# Patient Record
Sex: Male | Born: 1935
Health system: Southern US, Community
[De-identification: ages and names within clinical notes are randomized; demographics above are authoritative.]

## PROBLEM LIST (undated history)

## (undated) DIAGNOSIS — Z8546 Personal history of malignant neoplasm of prostate: Secondary | ICD-10-CM

## (undated) DIAGNOSIS — G510 Bell's palsy: Secondary | ICD-10-CM

## (undated) DIAGNOSIS — I447 Left bundle-branch block, unspecified: Secondary | ICD-10-CM

## (undated) DIAGNOSIS — E119 Type 2 diabetes mellitus without complications: Secondary | ICD-10-CM

## (undated) DIAGNOSIS — K219 Gastro-esophageal reflux disease without esophagitis: Secondary | ICD-10-CM

## (undated) DIAGNOSIS — C3490 Malignant neoplasm of unspecified part of unspecified bronchus or lung: Secondary | ICD-10-CM

## (undated) DIAGNOSIS — C801 Malignant (primary) neoplasm, unspecified: Secondary | ICD-10-CM

## (undated) DIAGNOSIS — F329 Major depressive disorder, single episode, unspecified: Secondary | ICD-10-CM

## (undated) DIAGNOSIS — Z950 Presence of cardiac pacemaker: Secondary | ICD-10-CM

## (undated) DIAGNOSIS — I10 Essential (primary) hypertension: Secondary | ICD-10-CM

## (undated) DIAGNOSIS — R2689 Other abnormalities of gait and mobility: Secondary | ICD-10-CM

## (undated) DIAGNOSIS — I428 Other cardiomyopathies: Secondary | ICD-10-CM

## (undated) DIAGNOSIS — W19XXXA Unspecified fall, initial encounter: Secondary | ICD-10-CM

## (undated) DIAGNOSIS — G4730 Sleep apnea, unspecified: Secondary | ICD-10-CM

## (undated) DIAGNOSIS — I2510 Atherosclerotic heart disease of native coronary artery without angina pectoris: Secondary | ICD-10-CM

## (undated) DIAGNOSIS — R42 Dizziness and giddiness: Secondary | ICD-10-CM

## (undated) DIAGNOSIS — E785 Hyperlipidemia, unspecified: Secondary | ICD-10-CM

## (undated) DIAGNOSIS — I456 Pre-excitation syndrome: Secondary | ICD-10-CM

## (undated) DIAGNOSIS — I442 Atrioventricular block, complete: Secondary | ICD-10-CM

## (undated) DIAGNOSIS — F32A Depression, unspecified: Secondary | ICD-10-CM

## (undated) DIAGNOSIS — I5042 Chronic combined systolic (congestive) and diastolic (congestive) heart failure: Secondary | ICD-10-CM

## (undated) DIAGNOSIS — M1990 Unspecified osteoarthritis, unspecified site: Secondary | ICD-10-CM

## (undated) HISTORY — DX: Chronic combined systolic (congestive) and diastolic (congestive) heart failure: I50.42

## (undated) HISTORY — DX: Malignant neoplasm of unspecified part of unspecified bronchus or lung: C34.90

## (undated) HISTORY — PX: INSERT / REPLACE / REMOVE PACEMAKER: SUR710

## (undated) HISTORY — PX: BACK SURGERY: SHX140

## (undated) HISTORY — PX: HAND SURGERY: SHX662

## (undated) HISTORY — DX: Bell's palsy: G51.0

## (undated) HISTORY — PX: REPLACEMENT TOTAL KNEE: SUR1224

## (undated) HISTORY — DX: Other cardiomyopathies: I42.8

## (undated) HISTORY — PX: OTHER SURGICAL HISTORY: SHX169

## (undated) HISTORY — PX: MOHS SURGERY: SUR867

## (undated) HISTORY — DX: Other abnormalities of gait and mobility: R26.89

## (undated) HISTORY — PX: CATARACT EXTRACTION: SUR2

## (undated) HISTORY — DX: Left bundle-branch block, unspecified: I44.7

## (undated) HISTORY — PX: PROSTATE SURGERY: SHX751

## (undated) HISTORY — DX: Atrioventricular block, complete: I44.2

## (undated) HISTORY — DX: Hyperlipidemia, unspecified: E78.5

## (undated) HISTORY — PX: PACEMAKER INSERTION: SHX728

## (undated) HISTORY — DX: Dizziness and giddiness: R42

## (undated) HISTORY — DX: Essential (primary) hypertension: I10

## (undated) HISTORY — DX: Unspecified fall, initial encounter: W19.XXXA

## (undated) HISTORY — PX: KNEE SURGERY: SHX244

## (undated) HISTORY — DX: Personal history of malignant neoplasm of prostate: Z85.46

## (undated) HISTORY — DX: Pre-excitation syndrome: I45.6

## (undated) HISTORY — DX: Atherosclerotic heart disease of native coronary artery without angina pectoris: I25.10

---
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
## 1989-12-23 HISTORY — PX: OTHER SURGICAL HISTORY: SHX169

## 2002-12-23 HISTORY — PX: JOINT REPLACEMENT: SHX530

## 2004-10-30 ENCOUNTER — Ambulatory Visit: Payer: Self-pay | Admitting: Gastroenterology

## 2004-11-24 ENCOUNTER — Ambulatory Visit: Payer: Self-pay | Admitting: Orthopedic Surgery

## 2004-11-24 ENCOUNTER — Other Ambulatory Visit: Payer: Self-pay

## 2006-10-28 ENCOUNTER — Ambulatory Visit: Payer: Self-pay | Admitting: Internal Medicine

## 2007-11-30 ENCOUNTER — Ambulatory Visit: Payer: Self-pay | Admitting: Gastroenterology

## 2009-08-01 ENCOUNTER — Ambulatory Visit: Payer: Self-pay | Admitting: Internal Medicine

## 2010-05-31 ENCOUNTER — Ambulatory Visit: Payer: Self-pay | Admitting: Internal Medicine

## 2010-06-19 ENCOUNTER — Inpatient Hospital Stay (HOSPITAL_COMMUNITY): Admission: RE | Admit: 2010-06-19 | Discharge: 2010-06-20 | Payer: Self-pay | Admitting: Neurosurgery

## 2010-09-13 ENCOUNTER — Encounter: Admission: RE | Admit: 2010-09-13 | Discharge: 2010-09-13 | Payer: Self-pay | Admitting: Neurosurgery

## 2010-11-29 ENCOUNTER — Encounter: Payer: Self-pay | Admitting: Cardiovascular Disease

## 2010-11-29 ENCOUNTER — Encounter: Payer: Self-pay | Admitting: Internal Medicine

## 2010-11-29 ENCOUNTER — Ambulatory Visit: Payer: Self-pay | Admitting: Cardiovascular Disease

## 2010-11-29 ENCOUNTER — Inpatient Hospital Stay (HOSPITAL_COMMUNITY)
Admission: EM | Admit: 2010-11-29 | Discharge: 2010-11-30 | Payer: Self-pay | Source: Home / Self Care | Attending: Internal Medicine | Admitting: Internal Medicine

## 2010-12-12 ENCOUNTER — Ambulatory Visit: Payer: Self-pay

## 2010-12-12 ENCOUNTER — Encounter: Payer: Self-pay | Admitting: Internal Medicine

## 2011-01-02 ENCOUNTER — Encounter: Payer: Self-pay | Admitting: Internal Medicine

## 2011-01-22 NOTE — Assessment & Plan Note (Signed)
Summary: EKG/AMD  Nurse Visit   Vital Signs:  Patient profile:   77 year old male Height:      69 inches Weight:      191 pounds BMI:     28.31 Pulse rate:   30 / minute BP sitting:   158 / 68  (left arm) Cuff size:   regular  Vitals Entered By: Lysbeth Galas CMA (November 29, 2010 11:44 AM) CC: c/o shortness of breath.  He went for a pre op for cataract surgery and was told had decrease heart rate. Comments Pt is alert and oriented x 3, only c/o slight incr in SOB.  A little more unsteady on his feet, but this is not new per pt report.  EKG done pt's HR is 30, and rhythm assessed by Dr Mariah Milling and pt is in complete heart block.  Non emergent EMS transport called, pt transported by EMS o [facility].  Called ED triage RN made aware pt coming, also called Trish to make aware.    Current Medications (verified): 1)  Hydrochlorothiazide 50 Mg Tabs (Hydrochlorothiazide) .Marland Kitchen.. 1 Tablet Once Daily 2)  Bupropion Hcl 150 Mg Xr24h-Tab (Bupropion Hcl) .... 3 Tablets in The Morning 3)  Simvastatin 20 Mg Tabs (Simvastatin) .Marland Kitchen.. 1 Tablet At Bedtime 4)  Nexium 40 Mg Cpdr (Esomeprazole Magnesium) .Marland Kitchen.. 1 Tablet Once Daily 5)  Vitamin D3 1000 Unit Tabs (Cholecalciferol) .Marland Kitchen.. 1 Tablet Once Daily 6)  Mens Multivitamin Plus  Tabs (Multiple Vitamins-Minerals) .Marland Kitchen.. 1 Tablet Daily 7)  Aleve 220 Mg Tabs (Naproxen Sodium) .Marland Kitchen.. 1 Tablet Two Times A Day 8)  Acyclovir 400 Mg Tabs (Acyclovir) .Marland Kitchen.. 1 Tablet Two Times A Day  Allergies (verified): 1)  ! Sulfa  Past History:  Past Medical History: Hyperlipidemia Hypertension  Past Surgical History: Ruptered [id number] and 1998 cervical fusion-1984 left knee 1991 and 1992 right knee-1995 catheter ablation-1991 right hand- 1993 prostate cancer-1998 knee replacement-2004 left hand- 2005 back surgery-2011   Visit Type:  Nurse Visit  CC:  c/o shortness of breath.  He went for a pre op for cataract surgery and was told had decrease heart rate.Marland Kitchen

## 2011-01-24 NOTE — Letter (Signed)
Summary: Dr. Rennie Plowman Office  Dr. Rennie Plowman Office   Imported By: Marylou Mccoy 01/17/2011 14:23:55  _____________________________________________________________________  External Attachment:    Type:   Image     Comment:   External Document

## 2011-01-24 NOTE — Cardiovascular Report (Signed)
Summary: Office Visit   Office Visit   Imported By: Roderic Ovens 01/01/2011 14:00:18  _____________________________________________________________________  External Attachment:    Type:   Image     Comment:   External Document

## 2011-01-24 NOTE — Procedures (Signed)
Summary: wound [facility]   Current Medications (verified): 1)  Hydrochlorothiazide 50 Mg Tabs (Hydrochlorothiazide) .Marland Kitchen.. 1 Tablet Once Daily 2)  Bupropion Hcl 150 Mg Xr24h-Tab (Bupropion Hcl) .... 3 Tablets in The Morning 3)  Simvastatin 20 Mg Tabs (Simvastatin) .Marland Kitchen.. 1 Tablet At Bedtime 4)  Nexium 40 Mg Cpdr (Esomeprazole Magnesium) .Marland Kitchen.. 1 Tablet Once Daily 5)  Vitamin D3 1000 Unit Tabs (Cholecalciferol) .Marland Kitchen.. 1 Tablet Once Daily 6)  Mens Multivitamin Plus  Tabs (Multiple Vitamins-Minerals) .Marland Kitchen.. 1 Tablet Daily 7)  Aleve 220 Mg Tabs (Naproxen Sodium) .... As Needed 8)  Amlodipine Besylate 5 Mg Tabs (Amlodipine Besylate) .... One By Mouth Daily  Allergies (verified): 1)  ! Sulfa  PPM Specifications Following MD:  Hillis Range, MD     PPM Vendor:  [employer] Jude     PPM Model Number:  [id number]     PPM Serial Number:  [id number] PPM DOI:  11/29/2010     PPM Implanting MD:  Hillis Range, MD  Lead 1    Location: RA     DOI: 11/29/2010     Model #: [id number]     Serial #: [id number]     Status: active Lead 2    Location: RV     DOI: 11/29/2010     Model #: [id number]     Serial #: [id number]     Status: active  Magnet Response Rate:  BOL 100 ERI 85  Indications:  CHB   PPM Follow Up Remote Check?  No Battery Voltage:  3.20 V     Battery Est. Longevity:  8.2 years     Pacer Dependent:  Yes       PPM Device Measurements Atrium  Amplitude: 4.2 mV, Impedance: 460 ohms, Threshold: 0.75 V at 0.4 msec Right Ventricle  Amplitude: 12 mV, Impedance: 700 ohms, Threshold: 0.5 V at 0.4 msec  Episodes MS Episodes:  0     Percent Mode Switch:  0     Coumadin:  No Atrial Pacing:  <1%     Ventricular Pacing:  100%  Parameters Mode:  DDD     Lower Rate Limit:  60     Upper Rate Limit:  130 Paced AV Delay:  200     Sensed AV Delay:  180 Tech Comments:  Steri strips removed by the patient, no redness or edema noted.  No parameter changes.  Device function normal.  ROV 03/21/11 with Dr. Johney Frame. Altha Harm, LPN   December 12, 2010 12:09 PM

## 2011-02-12 ENCOUNTER — Telehealth (INDEPENDENT_AMBULATORY_CARE_PROVIDER_SITE_OTHER): Payer: Self-pay | Admitting: *Deleted

## 2011-02-19 NOTE — Progress Notes (Signed)
Summary: question re deivce  Phone Note Call from Patient Call back at Home Phone [phone]   Caller: Spouse/ann Summary of Call: pt wife has question re pt pace maker.  Initial call taken by: Roe Coombs,  February 12, 2011 11:04 AM  Follow-up for Phone Call        Spoke with patient's wife explained it was ok to go away overnight and she did not have to take the transmitter. Follow-up by: Altha Harm, LPN,  February 13, 2011 2:32 PM

## 2011-02-27 ENCOUNTER — Encounter (INDEPENDENT_AMBULATORY_CARE_PROVIDER_SITE_OTHER): Payer: Self-pay | Admitting: *Deleted

## 2011-03-05 LAB — BASIC METABOLIC PANEL
CO2: 25 mEq/L (ref 19–32)
Chloride: 107 mEq/L (ref 96–112)
GFR calc Af Amer: >60 mL/min (ref >60)
Glucose, Bld: 92 mg/dL (ref 70–99)
Sodium: 138 mEq/L (ref 135–145)

## 2011-03-05 LAB — CBC
HCT: 43 % (ref 39.0–52.0)
Hemoglobin: 14.6 g/dL (ref 13.0–17.0)
MCH: 27 pg (ref 26.0–34.0)
MCHC: 34 g/dL (ref 30.0–36.0)
MCV: 79.5 fL (ref 78.0–100.0)
RBC: 5.41 MIL/uL (ref 4.22–5.81)

## 2011-03-05 LAB — DIFFERENTIAL
Basophils Relative: 0 % (ref 0–1)
Eosinophils Absolute: 0.3 10*3/uL (ref 0.0–0.7)
Eosinophils Relative: 4 % (ref 0–5)
Lymphs Abs: 2.1 10*3/uL (ref 0.7–4.0)
Monocytes Absolute: 0.7 10*3/uL (ref 0.1–1.0)
Monocytes Relative: 9 % (ref 3–12)

## 2011-03-05 LAB — COMPREHENSIVE METABOLIC PANEL
ALT: 23 U/L (ref 0–53)
Albumin: 3.8 g/dL (ref 3.5–5.2)
Calcium: 9.5 mg/dL (ref 8.4–10.5)
GFR calc Af Amer: >60 mL/min (ref >60)
Glucose, Bld: 90 mg/dL (ref 70–99)
Sodium: 137 mEq/L (ref 135–145)
Total Protein: 6.7 g/dL (ref 6.0–8.3)

## 2011-03-05 LAB — LIPID PANEL
Cholesterol: 145 mg/dL (ref 0–200)
LDL Cholesterol: 83 mg/dL (ref 0–99)
Total CHOL/HDL Ratio: 3.7 RATIO
Triglycerides: 116 mg/dL (ref <150)

## 2011-03-05 LAB — POCT CARDIAC MARKERS
CKMB, poc: 3 ng/mL (ref 1.0–8.0)
Myoglobin, poc: 124 ng/mL (ref 12–200)
Troponin i, poc: <0.05 ng/mL (ref 0.00–0.09)

## 2011-03-05 LAB — TSH: TSH: 1.905 u[IU]/mL (ref 0.350–4.500)

## 2011-03-05 LAB — PROTIME-INR: INR: 1.06 (ref 0.00–1.49)

## 2011-03-05 NOTE — Letter (Signed)
Summary: Appointment - Reschedule  [facility], Main Office  [address]   [place], [place] [address]   Phone: [phone]  Fax: [phone]     February 27, 2011 MRN: [id number]   Howard [name] [address] [place], [place]  [address]   Dear [name],   Due to a change in our office schedule, your appointment on 03-21-11  at  11:30a             must be changed.  It is very important that we reach you to reschedule this appointment. We look forward to participating in your health care needs. Please contact us at the number listed above at your earliest convenience to reschedule this appointment.     Sincerely,  Glass blower/designer

## 2011-03-08 ENCOUNTER — Encounter: Payer: Self-pay | Admitting: Internal Medicine

## 2011-03-10 LAB — PROTIME-INR
INR: 0.99 (ref 0.00–1.49)
Prothrombin Time: 13 seconds (ref 11.6–15.2)

## 2011-03-10 LAB — CBC
HCT: 48.9 % (ref 39.0–52.0)
Hemoglobin: 16.3 g/dL (ref 13.0–17.0)
MCH: 27.7 pg (ref 26.0–34.0)
MCHC: 33.3 g/dL (ref 30.0–36.0)

## 2011-03-10 LAB — DIFFERENTIAL
Lymphocytes Relative: 29 % (ref 12–46)
Lymphs Abs: 1.9 10*3/uL (ref 0.7–4.0)
Neutro Abs: 3.6 10*3/uL (ref 1.7–7.7)
Neutrophils Relative %: 54 % (ref 43–77)

## 2011-03-10 LAB — URINALYSIS, ROUTINE W REFLEX MICROSCOPIC
Bilirubin Urine: NEGATIVE
Glucose, UA: NEGATIVE mg/dL
Hgb urine dipstick: NEGATIVE
Ketones, ur: NEGATIVE mg/dL
Protein, ur: NEGATIVE mg/dL

## 2011-03-10 LAB — COMPREHENSIVE METABOLIC PANEL
CO2: 29 mEq/L (ref 19–32)
Calcium: 10.1 mg/dL (ref 8.4–10.5)
Creatinine, Ser: 0.84 mg/dL (ref 0.4–1.5)
GFR calc non Af Amer: >60 mL/min (ref >60)
Glucose, Bld: 131 mg/dL — ABNORMAL HIGH (ref 70–99)

## 2011-03-10 LAB — APTT: aPTT: 39 seconds — ABNORMAL HIGH (ref 24–37)

## 2011-03-21 ENCOUNTER — Other Ambulatory Visit: Payer: Self-pay | Admitting: Internal Medicine

## 2011-03-21 ENCOUNTER — Encounter: Payer: Self-pay | Admitting: Internal Medicine

## 2011-03-21 ENCOUNTER — Ambulatory Visit (INDEPENDENT_AMBULATORY_CARE_PROVIDER_SITE_OTHER): Payer: Medicare Other | Admitting: Internal Medicine

## 2011-03-21 ENCOUNTER — Other Ambulatory Visit: Payer: Self-pay | Admitting: *Deleted

## 2011-03-21 VITALS — BP 130/80 | HR 72

## 2011-03-21 DIAGNOSIS — I442 Atrioventricular block, complete: Secondary | ICD-10-CM

## 2011-03-21 DIAGNOSIS — I10 Essential (primary) hypertension: Secondary | ICD-10-CM

## 2011-03-21 MED ORDER — HYDROCHLOROTHIAZIDE 50 MG PO TABS: 25.0000 mg | ORAL_TABLET | Freq: Every day | ORAL | Status: DC

## 2011-03-21 MED ORDER — AMLODIPINE BESYLATE 5 MG PO TABS: 5.0000 mg | ORAL_TABLET | Freq: Every day | ORAL | Status: DC

## 2011-03-21 MED ORDER — HYDROCHLOROTHIAZIDE 25 MG PO TABS: 25.0000 mg | ORAL_TABLET | Freq: Every day | ORAL | Status: DC

## 2011-03-21 NOTE — Patient Instructions (Addendum)
Your physician recommends that you schedule a follow-up appointment in: Dec 2012 with Dr Johney Frame  Your physician has recommended you make the following change in your medication: decrease HCTZ to 25mg  daily

## 2011-03-22 NOTE — Telephone Encounter (Signed)
Refill manually faxed

## 2011-03-22 NOTE — Telephone Encounter (Signed)
Refill was manually faxed Howard [name]

## 2011-03-22 NOTE — Telephone Encounter (Signed)
Error not sure why this is open

## 2011-03-23 ENCOUNTER — Encounter: Payer: Self-pay | Admitting: Internal Medicine

## 2011-03-23 DIAGNOSIS — I442 Atrioventricular block, complete: Secondary | ICD-10-CM | POA: Insufficient documentation

## 2011-03-23 DIAGNOSIS — I10 Essential (primary) hypertension: Secondary | ICD-10-CM | POA: Insufficient documentation

## 2011-03-23 NOTE — Assessment & Plan Note (Signed)
Normal pacemaker function See Pace Art report No changes today  

## 2011-03-23 NOTE — Progress Notes (Signed)
The patient presents today for routine electrophysiology followup.  He presented urgently with complete heart block and underwent PPM by me 12/11.  He reports doing very well since that time.  His exercise tolerance has improved and fatigue has resolved.  Today, he denies symptoms of palpitations, chest pain, shortness of breath, orthopnea, PND, lower extremity edema, dizziness, presyncope, syncope, or neurologic sequela.  The patient feels that he is tolerating medications without difficulties and is otherwise without complaint today.   Past Medical History  Diagnosis Date  . Hyperlipidemia   . Hypertension   . Pacemaker 11/30/10    PPM- [facility] Jude implanted by [facility]   Past Surgical History  Procedure Date  . Ruptured disc     1962 and 1998  . Cervical fusion   . Knee surgery     left knee 1991 and 1992; right knee 1995  . Prostate surgery     cancer--1998  . Replacement total knee     2004  . Hand surgery     right 1993; left 2005  . Back surgery     2011  . Pacemaker insertion     PPM-- [employer] Jude 11/30/10 by Fawn Kirk  . Catheter ablation 1991    for WPW    Current outpatient prescriptions:buPROPion (ZYBAN) 150 MG 12 hr tablet, 150 mg. 3 po qam , Disp: , Rfl: ;  Cholecalciferol (VITAMIN D3) 1000 UNITS CAPS, Take by mouth. 1 po daily , Disp: , Rfl: ;  esomeprazole (NEXIUM) 40 MG capsule, Take 40 mg by mouth daily before breakfast.  , Disp: , Rfl: ;  Multiple Vitamin (MULTIVITAMIN) capsule, Take 1 capsule by mouth daily.  , Disp: , Rfl:  naproxen sodium (ANAPROX) 220 MG tablet, Take 220 mg by mouth as needed.  , Disp: , Rfl: ;  simvastatin (ZOCOR) 20 MG tablet, Take 20 mg by mouth at bedtime.  , Disp: , Rfl: ;  amLODipine (NORVASC) 5 MG tablet, Take 1 tablet (5 mg total) by mouth daily., Disp: 90 tablet, Rfl: 3;  hydrochlorothiazide 25 MG tablet, Take 1 tablet (25 mg total) by mouth daily., Disp: 90 tablet, Rfl: 3  Allergies  Allergen Reactions  . Sulfonamide Derivatives     History   Social  History  . Marital Status: Married    Spouse Name: N/A    Number of Children: N/A  . Years of Education: N/A   Occupational History  . Not on file.   Social History Main Topics  . Smoking status: Former Games developer  . Smokeless tobacco: Not on file  . Alcohol Use: No  . Drug Use: No  . Sexually Active: Not on file   Other Topics Concern  . Not on file   Social History Narrative  . No narrative on file

## 2011-03-23 NOTE — Assessment & Plan Note (Signed)
Stable No changes 

## 2011-04-29 ENCOUNTER — Ambulatory Visit: Payer: Self-pay | Admitting: Family Medicine

## 2011-05-21 ENCOUNTER — Telehealth: Payer: Self-pay | Admitting: Internal Medicine

## 2011-05-21 NOTE — Telephone Encounter (Signed)
Spoke with Dr Johney Frame  A magnet does need to be used  Will need to have this procedure rescheduled  Cataracts on both eyes This was scheduled for 6/21

## 2011-05-21 NOTE — Telephone Encounter (Signed)
Wife called and stated that husband is scheduled for eye surgery with Dr. Jettie Pagan.  Dr. Johney Frame gave note that it is ok to place a magnet over device during procedure.   Dr. Jettie Pagan said if this was necessary it would have to be done in hospital setting.  [facility] needs to know if it can be done without magnet.  Wife needs to know as well.  If magnet has to be used they will need to reschedule the procedures.

## 2011-05-24 ENCOUNTER — Ambulatory Visit: Payer: Self-pay | Admitting: Family Medicine

## 2011-06-23 ENCOUNTER — Ambulatory Visit: Payer: Self-pay | Admitting: Family Medicine

## 2011-07-15 ENCOUNTER — Telehealth: Payer: Self-pay | Admitting: Internal Medicine

## 2011-07-15 NOTE — Telephone Encounter (Signed)
Pt's wife called states Dr. Harlon Flor faxed a form for Dr. Johney Frame to sign for clearance for eye surgery. Wife states she called last week and some one tolled  Her, that form was in the  MD's desk for him to sign. Pt. Is aware that Dr. Johney Frame and his nurse are not in the office now . Patient is to start a medication tomorrow to prepared him for eye surgery. We will find out and call her tomorrow. Wife verbalized understanding.

## 2011-07-15 NOTE — Telephone Encounter (Signed)
Pt wife states pt needs sur clearance for eye surgery. Pt wife wants to know if his sur clearance form has been sent.

## 2011-07-16 NOTE — Telephone Encounter (Signed)
Wife aware form faxed for clearance

## 2011-07-24 ENCOUNTER — Encounter (HOSPITAL_COMMUNITY)
Admission: RE | Admit: 2011-07-24 | Discharge: 2011-07-24 | Disposition: A | Discharge status: Home / Self Care | Payer: Medicare Other | Source: Ambulatory Visit | Attending: Ophthalmology | Admitting: Ophthalmology

## 2011-07-24 LAB — CBC
HCT: 43.6 % (ref 39.0–52.0)
Hemoglobin: 14.9 g/dL (ref 13.0–17.0)
MCH: 27 pg (ref 26.0–34.0)
MCV: 79.1 fL (ref 78.0–100.0)
RBC: 5.51 MIL/uL (ref 4.22–5.81)

## 2011-07-24 LAB — BASIC METABOLIC PANEL
CO2: 30 mEq/L (ref 19–32)
Calcium: 10 mg/dL (ref 8.4–10.5)
Chloride: 103 mEq/L (ref 96–112)
Creatinine, Ser: 0.83 mg/dL (ref 0.50–1.35)
Glucose, Bld: 126 mg/dL — ABNORMAL HIGH (ref 70–99)

## 2011-07-29 ENCOUNTER — Encounter: Payer: Self-pay | Admitting: Orthopaedic Surgery

## 2011-07-31 ENCOUNTER — Ambulatory Visit (HOSPITAL_COMMUNITY)
Admission: RE | Admit: 2011-07-31 | Discharge: 2011-07-31 | Disposition: A | Discharge status: Home / Self Care | Payer: Medicare Other | Source: Ambulatory Visit | Attending: Ophthalmology | Admitting: Ophthalmology

## 2011-07-31 DIAGNOSIS — Z01812 Encounter for preprocedural laboratory examination: Secondary | ICD-10-CM | POA: Insufficient documentation

## 2011-07-31 DIAGNOSIS — E119 Type 2 diabetes mellitus without complications: Secondary | ICD-10-CM | POA: Insufficient documentation

## 2011-07-31 DIAGNOSIS — K219 Gastro-esophageal reflux disease without esophagitis: Secondary | ICD-10-CM | POA: Insufficient documentation

## 2011-07-31 DIAGNOSIS — H269 Unspecified cataract: Secondary | ICD-10-CM | POA: Insufficient documentation

## 2011-07-31 DIAGNOSIS — I10 Essential (primary) hypertension: Secondary | ICD-10-CM | POA: Insufficient documentation

## 2011-08-24 ENCOUNTER — Encounter: Payer: Self-pay | Admitting: Orthopaedic Surgery

## 2011-09-12 ENCOUNTER — Encounter (HOSPITAL_COMMUNITY)
Admission: RE | Admit: 2011-09-12 | Discharge: 2011-09-12 | Disposition: A | Discharge status: Home / Self Care | Payer: Medicare Other | Source: Ambulatory Visit | Attending: Ophthalmology | Admitting: Ophthalmology

## 2011-09-12 LAB — BASIC METABOLIC PANEL
BUN: 16 mg/dL (ref 6–23)
CO2: 28 mEq/L (ref 19–32)
GFR calc non Af Amer: >60 mL/min (ref >60)
Glucose, Bld: 169 mg/dL — ABNORMAL HIGH (ref 70–99)
Potassium: 4.7 mEq/L (ref 3.5–5.1)

## 2011-09-12 LAB — CBC
HCT: 43.1 % (ref 39.0–52.0)
Hemoglobin: 14.9 g/dL (ref 13.0–17.0)
MCH: 28 pg (ref 26.0–34.0)
MCHC: 34.6 g/dL (ref 30.0–36.0)
RBC: 5.33 MIL/uL (ref 4.22–5.81)

## 2011-09-18 ENCOUNTER — Ambulatory Visit (HOSPITAL_COMMUNITY)
Admission: RE | Admit: 2011-09-18 | Discharge: 2011-09-18 | Disposition: A | Discharge status: Home / Self Care | Payer: Medicare Other | Source: Ambulatory Visit | Attending: Ophthalmology | Admitting: Ophthalmology

## 2011-09-18 DIAGNOSIS — Z950 Presence of cardiac pacemaker: Secondary | ICD-10-CM | POA: Insufficient documentation

## 2011-09-18 DIAGNOSIS — Z01812 Encounter for preprocedural laboratory examination: Secondary | ICD-10-CM | POA: Insufficient documentation

## 2011-09-18 DIAGNOSIS — E119 Type 2 diabetes mellitus without complications: Secondary | ICD-10-CM | POA: Insufficient documentation

## 2011-09-18 DIAGNOSIS — H268 Other specified cataract: Secondary | ICD-10-CM | POA: Insufficient documentation

## 2011-09-18 DIAGNOSIS — I2510 Atherosclerotic heart disease of native coronary artery without angina pectoris: Secondary | ICD-10-CM | POA: Insufficient documentation

## 2011-09-18 DIAGNOSIS — I10 Essential (primary) hypertension: Secondary | ICD-10-CM | POA: Insufficient documentation

## 2011-09-18 DIAGNOSIS — G4733 Obstructive sleep apnea (adult) (pediatric): Secondary | ICD-10-CM | POA: Insufficient documentation

## 2011-10-10 NOTE — Op Note (Signed)
[name], [name] NO.:  [id number]  MEDICAL RECORD NO.:  [id number]  LOCATION:  [facility]                         FACILITY:  [facility]  PHYSICIAN:  Chalmers Guest, M.D.     DATE OF BIRTH:  10/20/1931  DATE OF PROCEDURE:  09/18/2011 DATE OF DISCHARGE:  09/18/2011                              OPERATIVE REPORT   PREOPERATIVE DIAGNOSIS:  Visually significant cataract, right eye.  POSTOPERATIVE DIAGNOSIS:  Visually significant cataract, right eye.  The patient has pacemaker and multiple medical problems indicating surgery at [facility].  COMPLICATIONS:  None.  ANESTHESIA:  Consisted of 2% Xylocaine with epinephrine and a 50/50 mixture of 0.75% Marcaine with ampule of Wydase.  PROCEDURE IN DETAIL:  The patient was taken to the operating room and under monitored anesthesia, he was given a peribulbar block with the aforementioned local anesthetic agent.  Following this, the patient's face was prepped and draped in the usual sterile fashion with the surgeon sitting temporally and operating microscope in position.  It was noted that the phaco machine was continuously running a pool of fluid. A nurse from outside the OR had to come in to make various adjustments to the machine to prepare it for use during the case.  After this commotion was finished, a Weck-Cel sponge was used to fixate the eye and a 15-degree blade was used to enter through superior temporal clear cornea.  Following this, Viscoat was injected, then an additional Weck- Cel sponge was used to fixate the globe and a 2.5-mm keratome blade was used in a stepwise fashion through temporal clear cornea.  At this point, a bent 25-gauge needle was used to incise the anterior capsule and a continuous tear 5-mm capsulorrhexis was formed.  The Utrata forceps were used to remove the anterior capsule.  BSS was used to hydrodissect and hydrodelineation the nucleus.  Following this, the phacoemulsification unit  was then used to remove the epinucleus and then sculpt the nucleus and the nucleus was cracked using a snapping technique with the Kugel hook and phaco machine.  Breaking the nucleus into three quadrants and then all nuclear fragments were removed from the eye.  The phacoemulsification machine was then removed and the eye was used to strip cortical fibers from the eye.  After all cortical fibers had been stripped, the posterior capsule remained intact. Therefore, Provisc was injected in the eye and an intraocular lens implant which had been pre-chosen was an 18 diopter AcrySof SN60WF lens. SN number [id number], the lens was placed in the lens injector and then injected into the eye.  The Kugel hook was used to position the trailing haptic into the lens.  The eye was then used to remove viscoelastic.  BSS was used to hydrate the cornea incision.  Following this, Miochol was injected, there being no leakage.  All instrumentation removed from the eye.  TobraDex ointment was applied.  A patch of Fox shield were placed and the patient returned to the recovery area in stable condition.  Complications were none.     Chalmers Guest, M.D.     RW/MEDQ  D:  09/18/2011  T:  09/18/2011  Job:  [id number]  cc:   Fax [phone]  Electronically Signed by Chalmers Guest M.D. on 10/10/2011 05:56:54 PM

## 2011-10-10 NOTE — Op Note (Signed)
  NAMEKEYON, LILLER NO.:  [id number]  MEDICAL RECORD NO.:  [id number]  LOCATION:  [facility]                         FACILITY:  [facility]  PHYSICIAN:  Chalmers Guest, M.D.     DATE OF BIRTH:  [birth year]  DATE OF PROCEDURE:  07/31/2011 DATE OF DISCHARGE:  07/31/2011                              OPERATIVE REPORT   PREOPERATIVE DIAGNOSIS:  Visually significant cataract, left eye.  POSTOPERATIVE DIAGNOSIS:  Visually significant cataract, left eye.  PROCEDURE:  Phacoemulsification and intraocular lens implant.  COMPLICATIONS:  None.  ANESTHESIA:  Consisted of 2% Xylocaine with epinephrine and 50:50 mixture of 0.75% Marcaine with ampule of Wydase.  PROCEDURE IN DETAIL:  The patient was given a peribulbar block with the aforementioned local anesthetic agent.  Following this, the patient's face was prepped and draped in usual sterile fashion with surgeon sitting temporally in the operating microscope position.  A Weck-cel sponge was used to fixate the globe and a 15-degree blade was used to enter through inferior clear cornea.  Following this, Viscoat was injected and then a 2.75-mm keratome blade was used in a stepwise fashion through temporal clear cornea to enter the eye.  Additional Viscoat was injected, and a bent 25-gauge needle was used to incise the anterior capsule.  A continuous tear of curvilinear capsulorrhexis was performed.  BSS was used to hydrodissect hydrodelineate the nucleus. Following this, the phacoemulsification unit was then used to sculpt the nucleus and snapped the nucleus into four quadrants and all nuclear fragments were removed.  The IA was then used to strip very adherent posterior capsular cortex from the posterior capsule.  After all cortex had been removed using the angle IA for subincisional cortex.  Provisc was injected in the eye and intraocular lens implant was examined and noted to have no defects.  It was an Alcon AcrySof SN60WF 19.0  diopter lens, SN [id number].  It was placed in the lens injector and then injected unfolded in position with a Kugel hook.  The IA was used to remove viscoelastic from the eye.  Following this, the eye was pressurized, a single 10-0 nylon suture was placed.  There had been no leakage.  All instrumentation was removed from the eye.  Topical TobraDex ointment was applied.  A patch of Fox shield were placed and the patient returned to recovery area in stable condition.     Chalmers Guest, M.D.     RW/MEDQ  D:  07/31/2011  T:  08/01/2011  Job:  [id number]  Electronically Signed by Chalmers Guest M.D. on 10/10/2011 05:57:13 PM

## 2011-12-23 ENCOUNTER — Ambulatory Visit (INDEPENDENT_AMBULATORY_CARE_PROVIDER_SITE_OTHER): Payer: Medicare Other | Admitting: Internal Medicine

## 2011-12-23 ENCOUNTER — Encounter: Payer: Self-pay | Admitting: Internal Medicine

## 2011-12-23 VITALS — BP 136/68 | HR 74 | Ht 69.5 in | Wt 188.4 lb

## 2011-12-23 DIAGNOSIS — I10 Essential (primary) hypertension: Secondary | ICD-10-CM

## 2011-12-23 DIAGNOSIS — E785 Hyperlipidemia, unspecified: Secondary | ICD-10-CM

## 2011-12-23 DIAGNOSIS — I442 Atrioventricular block, complete: Secondary | ICD-10-CM

## 2011-12-23 LAB — PACEMAKER DEVICE OBSERVATION
AL THRESHOLD: 1 V
ATRIAL PACING PM: 1
BAMS-0001: 150 {beats}/min
BAMS-0003: 70 {beats}/min
DEVICE MODEL PM: 7196739
RV LEAD IMPEDENCE PM: 600 Ohm
RV LEAD THRESHOLD: 0.875 V
VENTRICULAR PACING PM: 99

## 2011-12-23 MED ORDER — METOPROLOL SUCCINATE ER 25 MG PO TB24: 25.0000 mg | ORAL_TABLET | Freq: Every day | ORAL | Status: DC

## 2011-12-23 NOTE — Assessment & Plan Note (Signed)
Follow BP with addition of toprol

## 2011-12-23 NOTE — Progress Notes (Signed)
The patient presents today for routine electrophysiology followup.  He presented urgently with complete heart block and underwent PPM by me 12/11.  He reports doing very well since that time.  His exercise tolerance has improved and fatigue has resolved.  Today, he denies symptoms of palpitations, chest pain, shortness of breath, orthopnea, PND, lower extremity edema, dizziness, presyncope, syncope, or neurologic sequela.  His primary concern is with chronic knee pain which limits mobility.  The patient feels that he is tolerating medications without difficulties and is otherwise without complaint today.   Past Medical History  Diagnosis Date  . Hyperlipidemia   . Hypertension   . Pacemaker 11/30/10    PPM- St Jude implanted by [facility]   Past Surgical History  Procedure Date  . Ruptured disc     1962 and 1998  . Cervical fusion   . Knee surgery     left knee 1991 and 1992; right knee 1995  . Prostate surgery     cancer--1998  . Replacement total knee     2004  . Hand surgery     right 1993; left 2005  . Back surgery     2011  . Pacemaker insertion     PPM-- [employer] Jude 11/30/10 by Fawn Kirk  . Catheter ablation 1991    for WPW    Current outpatient prescriptions:amLODipine (NORVASC) 5 MG tablet, Take 1 tablet (5 mg total) by mouth daily., Disp: 90 tablet, Rfl: 3;  aspirin 81 MG tablet, Take 81 mg by mouth daily.  , Disp: , Rfl: ;  buPROPion (ZYBAN) 150 MG 12 hr tablet, 150 mg. 3 po qam , Disp: , Rfl: ;  Cholecalciferol (VITAMIN D3) 1000 UNITS CAPS, Take by mouth. 1 po daily , Disp: , Rfl:  esomeprazole (NEXIUM) 40 MG capsule, Take 40 mg by mouth daily before breakfast.  , Disp: , Rfl: ;  hydrochlorothiazide 25 MG tablet, Take 1 tablet (25 mg total) by mouth daily., Disp: 90 tablet, Rfl: 3;  metFORMIN (GLUCOPHAGE) 500 MG tablet, Take 500 mg by mouth daily with breakfast. , Disp: , Rfl: ;  Multiple Vitamin (MULTIVITAMIN) capsule, Take 1 capsule by mouth daily.  , Disp: , Rfl:  naproxen sodium (ANAPROX)  220 MG tablet, Take 220 mg by mouth as needed.  , Disp: , Rfl: ;  simvastatin (ZOCOR) 20 MG tablet, Take 20 mg by mouth at bedtime.  , Disp: , Rfl:   Allergies  Allergen Reactions  . Sulfonamide Derivatives     History   Social History  . Marital Status: Married    Spouse Name: N/A    Number of Children: N/A  . Years of Education: N/A   Occupational History  . Not on file.   Social History Main Topics  . Smoking status: Former Games developer  . Smokeless tobacco: Not on file  . Alcohol Use: No  . Drug Use: No  . Sexually Active: Not on file   Other Topics Concern  . Not on file   Social History Narrative  . No narrative on file   Physical Exam: Filed Vitals:   12/23/11 0913  BP: 136/68  Pulse: 74  Height: 5' 9.5" (1.765 m)  Weight: 188 lb 6.4 oz (85.458 kg)    GEN- The patient is well appearing, alert and oriented x 3 today.   Head- normocephalic, atraumatic Eyes-  Sclera clear, conjunctiva pink Ears- hearing intact Oropharynx- clear Neck- supple, no JVP Lymph- no cervical lymphadenopathy Lungs- Clear to ausculation bilaterally, normal work  of breathing Chest- pacemaker pocket is well healed Heart- Regular rate and rhythm, no murmurs, rubs or gallops, PMI not laterally displaced GI- soft, NT, ND, + BS Extremities- no clubbing, cyanosis, or edema  Pacemaker interrogation- reviewed in detail today,  See PACEART report  Assessment and Plan:

## 2011-12-23 NOTE — Assessment & Plan Note (Signed)
Normal pacemaker function See Pace Art report No changes today  He had NSVT observed on device interrogation Will add toprol XL 25mg  daily  Merlin checks every 3 months, return in 12 months

## 2011-12-23 NOTE — Assessment & Plan Note (Signed)
Not fasting today Will have fasting lipids by pcp

## 2011-12-23 NOTE — Patient Instructions (Addendum)
Your physician wants you to follow-up in: 12 months with Dr Jacquiline Doe will receive a reminder letter in the mail two months in advance. If you don't receive a letter, please call our office to schedule the follow-up appointment.  Remote monitoring is used to monitor your Pacemaker of ICD from home. This monitoring reduces the number of office visits required to check your device to one time per year. It allows [place] to keep an eye on the functioning of your device to ensure it is working properly. You are scheduled for a device check from home on 03/26/2012. You may send your transmission at any time that day. If you have a wireless device, the transmission will be sent automatically. After your physician reviews your transmission, you will receive a postcard with your next transmission date.  Your physician has recommended you make the following change in your medication:  1) Start Toprol(Metoprolol) 25mg  daily

## 2011-12-23 NOTE — Progress Notes (Signed)
Addended by: Dennis Bast F on: 12/23/2011 09:49 AM   Modules accepted: Orders

## 2011-12-24 HISTORY — PX: JOINT REPLACEMENT: SHX530

## 2011-12-30 ENCOUNTER — Telehealth: Payer: Self-pay | Admitting: Internal Medicine

## 2011-12-30 DIAGNOSIS — D0439 Carcinoma in situ of skin of other parts of face: Secondary | ICD-10-CM | POA: Diagnosis not present

## 2011-12-30 DIAGNOSIS — D0430 Carcinoma in situ of skin of unspecified part of face: Secondary | ICD-10-CM | POA: Diagnosis not present

## 2011-12-30 DIAGNOSIS — C44320 Squamous cell carcinoma of skin of unspecified parts of face: Secondary | ICD-10-CM | POA: Diagnosis not present

## 2011-12-30 NOTE — Telephone Encounter (Signed)
New msg Pt's wife is calling on how to take metoprolol and when morning or night. Please call her back

## 2011-12-30 NOTE — Telephone Encounter (Signed)
Okay to take either morning or night it does not matter, just as long as he takes it once daily   He should take it every 24 hours and keep as close to the same time as possible

## 2012-01-02 ENCOUNTER — Ambulatory Visit: Payer: Self-pay | Admitting: Unknown Physician Specialty

## 2012-01-02 DIAGNOSIS — R042 Hemoptysis: Secondary | ICD-10-CM | POA: Diagnosis not present

## 2012-01-02 DIAGNOSIS — K920 Hematemesis: Secondary | ICD-10-CM | POA: Diagnosis not present

## 2012-01-03 ENCOUNTER — Ambulatory Visit: Payer: Self-pay | Admitting: Unknown Physician Specialty

## 2012-01-03 DIAGNOSIS — I10 Essential (primary) hypertension: Secondary | ICD-10-CM | POA: Diagnosis not present

## 2012-01-03 DIAGNOSIS — K449 Diaphragmatic hernia without obstruction or gangrene: Secondary | ICD-10-CM | POA: Diagnosis not present

## 2012-01-03 DIAGNOSIS — Z7982 Long term (current) use of aspirin: Secondary | ICD-10-CM | POA: Diagnosis not present

## 2012-01-03 DIAGNOSIS — K2990 Gastroduodenitis, unspecified, without bleeding: Secondary | ICD-10-CM | POA: Diagnosis not present

## 2012-01-03 DIAGNOSIS — K920 Hematemesis: Secondary | ICD-10-CM | POA: Diagnosis not present

## 2012-01-03 DIAGNOSIS — E119 Type 2 diabetes mellitus without complications: Secondary | ICD-10-CM | POA: Diagnosis not present

## 2012-01-03 DIAGNOSIS — D131 Benign neoplasm of stomach: Secondary | ICD-10-CM | POA: Diagnosis not present

## 2012-01-03 DIAGNOSIS — Z79899 Other long term (current) drug therapy: Secondary | ICD-10-CM | POA: Diagnosis not present

## 2012-01-03 DIAGNOSIS — R042 Hemoptysis: Secondary | ICD-10-CM | POA: Diagnosis not present

## 2012-01-03 DIAGNOSIS — I4460 Unspecified fascicular block: Secondary | ICD-10-CM | POA: Diagnosis not present

## 2012-01-03 DIAGNOSIS — K2970 Gastritis, unspecified, without bleeding: Secondary | ICD-10-CM | POA: Diagnosis not present

## 2012-01-03 DIAGNOSIS — Z950 Presence of cardiac pacemaker: Secondary | ICD-10-CM | POA: Diagnosis not present

## 2012-01-07 DIAGNOSIS — D0439 Carcinoma in situ of skin of other parts of face: Secondary | ICD-10-CM | POA: Diagnosis not present

## 2012-01-07 DIAGNOSIS — D485 Neoplasm of uncertain behavior of skin: Secondary | ICD-10-CM | POA: Diagnosis not present

## 2012-01-07 DIAGNOSIS — D0430 Carcinoma in situ of skin of unspecified part of face: Secondary | ICD-10-CM | POA: Diagnosis not present

## 2012-01-07 DIAGNOSIS — C44611 Basal cell carcinoma of skin of unspecified upper limb, including shoulder: Secondary | ICD-10-CM | POA: Diagnosis not present

## 2012-01-16 DIAGNOSIS — E119 Type 2 diabetes mellitus without complications: Secondary | ICD-10-CM | POA: Diagnosis not present

## 2012-01-16 DIAGNOSIS — Z8679 Personal history of other diseases of the circulatory system: Secondary | ICD-10-CM | POA: Diagnosis not present

## 2012-01-16 DIAGNOSIS — I10 Essential (primary) hypertension: Secondary | ICD-10-CM | POA: Diagnosis not present

## 2012-01-16 DIAGNOSIS — E7800 Pure hypercholesterolemia, unspecified: Secondary | ICD-10-CM | POA: Diagnosis not present

## 2012-01-17 ENCOUNTER — Ambulatory Visit: Payer: Self-pay | Admitting: Family Medicine

## 2012-01-17 DIAGNOSIS — I714 Abdominal aortic aneurysm, without rupture, unspecified: Secondary | ICD-10-CM | POA: Diagnosis not present

## 2012-01-17 DIAGNOSIS — Z87891 Personal history of nicotine dependence: Secondary | ICD-10-CM | POA: Diagnosis not present

## 2012-01-20 DIAGNOSIS — D0430 Carcinoma in situ of skin of unspecified part of face: Secondary | ICD-10-CM | POA: Diagnosis not present

## 2012-01-20 DIAGNOSIS — D0439 Carcinoma in situ of skin of other parts of face: Secondary | ICD-10-CM | POA: Diagnosis not present

## 2012-01-20 DIAGNOSIS — C44320 Squamous cell carcinoma of skin of unspecified parts of face: Secondary | ICD-10-CM | POA: Diagnosis not present

## 2012-01-28 DIAGNOSIS — IMO0002 Reserved for concepts with insufficient information to code with codable children: Secondary | ICD-10-CM | POA: Diagnosis not present

## 2012-01-28 DIAGNOSIS — F411 Generalized anxiety disorder: Secondary | ICD-10-CM | POA: Diagnosis not present

## 2012-03-04 DIAGNOSIS — H35389 Toxic maculopathy, unspecified eye: Secondary | ICD-10-CM | POA: Diagnosis not present

## 2012-03-25 DIAGNOSIS — M6530 Trigger finger, unspecified finger: Secondary | ICD-10-CM | POA: Diagnosis not present

## 2012-03-26 ENCOUNTER — Encounter: Payer: Medicare Other | Admitting: *Deleted

## 2012-04-02 ENCOUNTER — Encounter: Payer: Self-pay | Admitting: *Deleted

## 2012-04-09 DIAGNOSIS — E119 Type 2 diabetes mellitus without complications: Secondary | ICD-10-CM | POA: Diagnosis not present

## 2012-04-09 DIAGNOSIS — G4730 Sleep apnea, unspecified: Secondary | ICD-10-CM | POA: Diagnosis not present

## 2012-04-09 DIAGNOSIS — C61 Malignant neoplasm of prostate: Secondary | ICD-10-CM | POA: Diagnosis not present

## 2012-04-09 DIAGNOSIS — I10 Essential (primary) hypertension: Secondary | ICD-10-CM | POA: Diagnosis not present

## 2012-04-09 DIAGNOSIS — Z950 Presence of cardiac pacemaker: Secondary | ICD-10-CM | POA: Diagnosis not present

## 2012-04-09 DIAGNOSIS — M6530 Trigger finger, unspecified finger: Secondary | ICD-10-CM | POA: Diagnosis not present

## 2012-04-09 DIAGNOSIS — Z01818 Encounter for other preprocedural examination: Secondary | ICD-10-CM | POA: Diagnosis not present

## 2012-04-10 DIAGNOSIS — I10 Essential (primary) hypertension: Secondary | ICD-10-CM | POA: Diagnosis not present

## 2012-04-10 DIAGNOSIS — G609 Hereditary and idiopathic neuropathy, unspecified: Secondary | ICD-10-CM | POA: Diagnosis not present

## 2012-04-10 DIAGNOSIS — Z87891 Personal history of nicotine dependence: Secondary | ICD-10-CM | POA: Diagnosis not present

## 2012-04-10 DIAGNOSIS — M6530 Trigger finger, unspecified finger: Secondary | ICD-10-CM | POA: Diagnosis not present

## 2012-04-10 DIAGNOSIS — G4730 Sleep apnea, unspecified: Secondary | ICD-10-CM | POA: Diagnosis not present

## 2012-04-10 DIAGNOSIS — E119 Type 2 diabetes mellitus without complications: Secondary | ICD-10-CM | POA: Diagnosis not present

## 2012-04-10 DIAGNOSIS — Z9079 Acquired absence of other genital organ(s): Secondary | ICD-10-CM | POA: Diagnosis not present

## 2012-04-10 DIAGNOSIS — Z950 Presence of cardiac pacemaker: Secondary | ICD-10-CM | POA: Diagnosis not present

## 2012-04-10 DIAGNOSIS — I442 Atrioventricular block, complete: Secondary | ICD-10-CM | POA: Diagnosis not present

## 2012-04-10 DIAGNOSIS — E785 Hyperlipidemia, unspecified: Secondary | ICD-10-CM | POA: Diagnosis not present

## 2012-04-10 DIAGNOSIS — M549 Dorsalgia, unspecified: Secondary | ICD-10-CM | POA: Diagnosis not present

## 2012-04-10 DIAGNOSIS — Z96659 Presence of unspecified artificial knee joint: Secondary | ICD-10-CM | POA: Diagnosis not present

## 2012-04-10 DIAGNOSIS — Z7982 Long term (current) use of aspirin: Secondary | ICD-10-CM | POA: Diagnosis not present

## 2012-04-10 DIAGNOSIS — K219 Gastro-esophageal reflux disease without esophagitis: Secondary | ICD-10-CM | POA: Diagnosis not present

## 2012-04-10 DIAGNOSIS — Z79899 Other long term (current) drug therapy: Secondary | ICD-10-CM | POA: Diagnosis not present

## 2012-04-10 DIAGNOSIS — Z8546 Personal history of malignant neoplasm of prostate: Secondary | ICD-10-CM | POA: Diagnosis not present

## 2012-04-14 ENCOUNTER — Encounter: Payer: Self-pay | Admitting: Internal Medicine

## 2012-04-14 ENCOUNTER — Ambulatory Visit (INDEPENDENT_AMBULATORY_CARE_PROVIDER_SITE_OTHER): Payer: Medicare Other | Admitting: *Deleted

## 2012-04-14 DIAGNOSIS — I442 Atrioventricular block, complete: Secondary | ICD-10-CM | POA: Diagnosis not present

## 2012-04-14 LAB — REMOTE PACEMAKER DEVICE
AL AMPLITUDE: 3.7 mv
AL IMPEDENCE PM: 510 Ohm
BAMS-0001: 150 {beats}/min
BAMS-0003: 70 {beats}/min
BATTERY VOLTAGE: 2.95 V
BRDY-0003RV: 130 {beats}/min
DEVICE MODEL PM: 7196739
RV LEAD AMPLITUDE: 12 mv
RV LEAD IMPEDENCE PM: 590 Ohm
RV LEAD THRESHOLD: 0.75 V

## 2012-04-17 DIAGNOSIS — E119 Type 2 diabetes mellitus without complications: Secondary | ICD-10-CM | POA: Diagnosis not present

## 2012-04-17 DIAGNOSIS — E7800 Pure hypercholesterolemia, unspecified: Secondary | ICD-10-CM | POA: Diagnosis not present

## 2012-04-20 DIAGNOSIS — L570 Actinic keratosis: Secondary | ICD-10-CM | POA: Diagnosis not present

## 2012-04-20 DIAGNOSIS — Z85828 Personal history of other malignant neoplasm of skin: Secondary | ICD-10-CM | POA: Diagnosis not present

## 2012-04-20 NOTE — Progress Notes (Signed)
Remote pacer check  

## 2012-04-24 ENCOUNTER — Encounter: Payer: Self-pay | Admitting: *Deleted

## 2012-05-15 DIAGNOSIS — I10 Essential (primary) hypertension: Secondary | ICD-10-CM | POA: Diagnosis not present

## 2012-05-15 DIAGNOSIS — Z8679 Personal history of other diseases of the circulatory system: Secondary | ICD-10-CM | POA: Diagnosis not present

## 2012-05-15 DIAGNOSIS — E7800 Pure hypercholesterolemia, unspecified: Secondary | ICD-10-CM | POA: Diagnosis not present

## 2012-05-15 DIAGNOSIS — M129 Arthropathy, unspecified: Secondary | ICD-10-CM | POA: Diagnosis not present

## 2012-06-01 DIAGNOSIS — G4733 Obstructive sleep apnea (adult) (pediatric): Secondary | ICD-10-CM | POA: Diagnosis not present

## 2012-06-18 DIAGNOSIS — I498 Other specified cardiac arrhythmias: Secondary | ICD-10-CM | POA: Insufficient documentation

## 2012-06-18 DIAGNOSIS — Z950 Presence of cardiac pacemaker: Secondary | ICD-10-CM | POA: Diagnosis not present

## 2012-06-18 DIAGNOSIS — Z79899 Other long term (current) drug therapy: Secondary | ICD-10-CM | POA: Diagnosis not present

## 2012-06-18 DIAGNOSIS — Z01810 Encounter for preprocedural cardiovascular examination: Secondary | ICD-10-CM | POA: Diagnosis not present

## 2012-06-18 DIAGNOSIS — K219 Gastro-esophageal reflux disease without esophagitis: Secondary | ICD-10-CM | POA: Insufficient documentation

## 2012-06-19 DIAGNOSIS — G4733 Obstructive sleep apnea (adult) (pediatric): Secondary | ICD-10-CM | POA: Diagnosis not present

## 2012-06-19 DIAGNOSIS — M6530 Trigger finger, unspecified finger: Secondary | ICD-10-CM | POA: Diagnosis not present

## 2012-06-19 DIAGNOSIS — E785 Hyperlipidemia, unspecified: Secondary | ICD-10-CM | POA: Diagnosis not present

## 2012-06-19 DIAGNOSIS — E1149 Type 2 diabetes mellitus with other diabetic neurological complication: Secondary | ICD-10-CM | POA: Diagnosis not present

## 2012-06-19 DIAGNOSIS — I459 Conduction disorder, unspecified: Secondary | ICD-10-CM | POA: Insufficient documentation

## 2012-06-19 DIAGNOSIS — I10 Essential (primary) hypertension: Secondary | ICD-10-CM | POA: Diagnosis not present

## 2012-06-19 DIAGNOSIS — Z79899 Other long term (current) drug therapy: Secondary | ICD-10-CM | POA: Diagnosis not present

## 2012-06-19 DIAGNOSIS — E1142 Type 2 diabetes mellitus with diabetic polyneuropathy: Secondary | ICD-10-CM | POA: Diagnosis not present

## 2012-06-19 DIAGNOSIS — Z950 Presence of cardiac pacemaker: Secondary | ICD-10-CM | POA: Diagnosis not present

## 2012-07-07 ENCOUNTER — Encounter: Payer: Self-pay | Admitting: Orthopedic Surgery

## 2012-07-07 DIAGNOSIS — M6281 Muscle weakness (generalized): Secondary | ICD-10-CM | POA: Diagnosis not present

## 2012-07-07 DIAGNOSIS — M25549 Pain in joints of unspecified hand: Secondary | ICD-10-CM | POA: Diagnosis not present

## 2012-07-07 DIAGNOSIS — IMO0001 Reserved for inherently not codable concepts without codable children: Secondary | ICD-10-CM | POA: Diagnosis not present

## 2012-07-07 DIAGNOSIS — M25649 Stiffness of unspecified hand, not elsewhere classified: Secondary | ICD-10-CM | POA: Diagnosis not present

## 2012-07-09 DIAGNOSIS — IMO0001 Reserved for inherently not codable concepts without codable children: Secondary | ICD-10-CM | POA: Diagnosis not present

## 2012-07-09 DIAGNOSIS — M25549 Pain in joints of unspecified hand: Secondary | ICD-10-CM | POA: Diagnosis not present

## 2012-07-09 DIAGNOSIS — M6281 Muscle weakness (generalized): Secondary | ICD-10-CM | POA: Diagnosis not present

## 2012-07-09 DIAGNOSIS — M25649 Stiffness of unspecified hand, not elsewhere classified: Secondary | ICD-10-CM | POA: Diagnosis not present

## 2012-07-14 DIAGNOSIS — M6281 Muscle weakness (generalized): Secondary | ICD-10-CM | POA: Diagnosis not present

## 2012-07-14 DIAGNOSIS — M25649 Stiffness of unspecified hand, not elsewhere classified: Secondary | ICD-10-CM | POA: Diagnosis not present

## 2012-07-14 DIAGNOSIS — M25549 Pain in joints of unspecified hand: Secondary | ICD-10-CM | POA: Diagnosis not present

## 2012-07-14 DIAGNOSIS — IMO0001 Reserved for inherently not codable concepts without codable children: Secondary | ICD-10-CM | POA: Diagnosis not present

## 2012-07-16 ENCOUNTER — Encounter: Payer: Medicare Other | Admitting: *Deleted

## 2012-07-16 DIAGNOSIS — M25649 Stiffness of unspecified hand, not elsewhere classified: Secondary | ICD-10-CM | POA: Diagnosis not present

## 2012-07-16 DIAGNOSIS — IMO0001 Reserved for inherently not codable concepts without codable children: Secondary | ICD-10-CM | POA: Diagnosis not present

## 2012-07-16 DIAGNOSIS — M25549 Pain in joints of unspecified hand: Secondary | ICD-10-CM | POA: Diagnosis not present

## 2012-07-16 DIAGNOSIS — M6281 Muscle weakness (generalized): Secondary | ICD-10-CM | POA: Diagnosis not present

## 2012-07-21 ENCOUNTER — Encounter: Payer: Self-pay | Admitting: *Deleted

## 2012-07-21 DIAGNOSIS — M6281 Muscle weakness (generalized): Secondary | ICD-10-CM | POA: Diagnosis not present

## 2012-07-21 DIAGNOSIS — M25649 Stiffness of unspecified hand, not elsewhere classified: Secondary | ICD-10-CM | POA: Diagnosis not present

## 2012-07-21 DIAGNOSIS — IMO0001 Reserved for inherently not codable concepts without codable children: Secondary | ICD-10-CM | POA: Diagnosis not present

## 2012-07-21 DIAGNOSIS — M129 Arthropathy, unspecified: Secondary | ICD-10-CM | POA: Diagnosis not present

## 2012-07-21 DIAGNOSIS — M25549 Pain in joints of unspecified hand: Secondary | ICD-10-CM | POA: Diagnosis not present

## 2012-07-21 DIAGNOSIS — E7800 Pure hypercholesterolemia, unspecified: Secondary | ICD-10-CM | POA: Diagnosis not present

## 2012-07-21 DIAGNOSIS — Z8679 Personal history of other diseases of the circulatory system: Secondary | ICD-10-CM | POA: Diagnosis not present

## 2012-07-21 DIAGNOSIS — I10 Essential (primary) hypertension: Secondary | ICD-10-CM | POA: Diagnosis not present

## 2012-07-23 ENCOUNTER — Encounter: Payer: Self-pay | Admitting: Orthopedic Surgery

## 2012-07-23 DIAGNOSIS — F411 Generalized anxiety disorder: Secondary | ICD-10-CM | POA: Diagnosis not present

## 2012-07-23 DIAGNOSIS — M25549 Pain in joints of unspecified hand: Secondary | ICD-10-CM | POA: Diagnosis not present

## 2012-07-23 DIAGNOSIS — IMO0001 Reserved for inherently not codable concepts without codable children: Secondary | ICD-10-CM | POA: Diagnosis not present

## 2012-07-23 DIAGNOSIS — IMO0002 Reserved for concepts with insufficient information to code with codable children: Secondary | ICD-10-CM | POA: Diagnosis not present

## 2012-07-23 DIAGNOSIS — M25649 Stiffness of unspecified hand, not elsewhere classified: Secondary | ICD-10-CM | POA: Diagnosis not present

## 2012-07-23 DIAGNOSIS — M6281 Muscle weakness (generalized): Secondary | ICD-10-CM | POA: Diagnosis not present

## 2012-07-27 ENCOUNTER — Telehealth: Payer: Self-pay | Admitting: Internal Medicine

## 2012-07-27 DIAGNOSIS — E7800 Pure hypercholesterolemia, unspecified: Secondary | ICD-10-CM | POA: Diagnosis not present

## 2012-07-27 NOTE — Telephone Encounter (Signed)
Spoke with [facility]), they recently changed phone providers and have not been able to send in [place] transmissions.  [employer] to send out adapter and new transmitter and patient will download at that time.  Also made follow up appointment with Dr. Johney Frame for 11/27/12 at wife's request.

## 2012-07-27 NOTE — Telephone Encounter (Signed)
New msg Pt's wife called about monitor.She wants to discuss with you Please call

## 2012-08-03 ENCOUNTER — Ambulatory Visit (INDEPENDENT_AMBULATORY_CARE_PROVIDER_SITE_OTHER): Payer: Medicare Other | Admitting: *Deleted

## 2012-08-03 ENCOUNTER — Encounter: Payer: Self-pay | Admitting: Internal Medicine

## 2012-08-03 ENCOUNTER — Encounter: Payer: Self-pay | Admitting: *Deleted

## 2012-08-03 DIAGNOSIS — I442 Atrioventricular block, complete: Secondary | ICD-10-CM | POA: Diagnosis not present

## 2012-08-03 DIAGNOSIS — Z950 Presence of cardiac pacemaker: Secondary | ICD-10-CM | POA: Diagnosis not present

## 2012-08-03 LAB — REMOTE PACEMAKER DEVICE
AL AMPLITUDE: 3.7 mv
AL IMPEDENCE PM: 460 Ohm
BAMS-0001: 150 {beats}/min
BATTERY VOLTAGE: 2.96 V
BRDY-0002RV: 60 {beats}/min
RV LEAD AMPLITUDE: 11 mv
VENTRICULAR PACING PM: 68

## 2012-08-12 ENCOUNTER — Encounter: Payer: Self-pay | Admitting: *Deleted

## 2012-08-12 DIAGNOSIS — M6530 Trigger finger, unspecified finger: Secondary | ICD-10-CM | POA: Diagnosis not present

## 2012-08-20 ENCOUNTER — Telehealth: Payer: Self-pay | Admitting: Internal Medicine

## 2012-08-20 DIAGNOSIS — M25569 Pain in unspecified knee: Secondary | ICD-10-CM | POA: Diagnosis not present

## 2012-08-20 DIAGNOSIS — M1710 Unilateral primary osteoarthritis, unspecified knee: Secondary | ICD-10-CM | POA: Diagnosis not present

## 2012-08-20 DIAGNOSIS — Z96659 Presence of unspecified artificial knee joint: Secondary | ICD-10-CM | POA: Diagnosis not present

## 2012-08-20 DIAGNOSIS — R269 Unspecified abnormalities of gait and mobility: Secondary | ICD-10-CM | POA: Insufficient documentation

## 2012-08-20 DIAGNOSIS — IMO0002 Reserved for concepts with insufficient information to code with codable children: Secondary | ICD-10-CM | POA: Diagnosis not present

## 2012-08-20 NOTE — Telephone Encounter (Signed)
New Problem:    Patient called in requesting a note for surgical clearance for her husband to have knee surgery at [facility].  Dr. Tod Persia fax- [phone]. Please call back.

## 2012-08-20 NOTE — Telephone Encounter (Signed)
Howard [name] needs a knee replacement.  The surgeon is requiring a clearance from Dr Johney Frame before the surgery can be scheduled.  Please fax clearance or need for for further appt or testing to the surgeon and please call [name] and let her know if he can be cleared.

## 2012-08-21 DIAGNOSIS — D485 Neoplasm of uncertain behavior of skin: Secondary | ICD-10-CM | POA: Diagnosis not present

## 2012-08-21 DIAGNOSIS — D0439 Carcinoma in situ of skin of other parts of face: Secondary | ICD-10-CM | POA: Diagnosis not present

## 2012-08-21 DIAGNOSIS — Z85828 Personal history of other malignant neoplasm of skin: Secondary | ICD-10-CM | POA: Diagnosis not present

## 2012-08-21 DIAGNOSIS — L570 Actinic keratosis: Secondary | ICD-10-CM | POA: Diagnosis not present

## 2012-08-21 DIAGNOSIS — D0430 Carcinoma in situ of skin of unspecified part of face: Secondary | ICD-10-CM | POA: Diagnosis not present

## 2012-08-23 ENCOUNTER — Encounter: Payer: Self-pay | Admitting: Orthopedic Surgery

## 2012-08-24 NOTE — Telephone Encounter (Signed)
Proceed with surgery if medically indicated. No further CV testing required prior to surgery.

## 2012-08-25 ENCOUNTER — Telehealth: Payer: Self-pay | Admitting: Internal Medicine

## 2012-08-25 NOTE — Telephone Encounter (Signed)
Pt's wife calling re paperwork dr Johney Frame was to fill out

## 2012-08-25 NOTE — Telephone Encounter (Signed)
Spoke with patients wife and let her know we faxed the surgical clearance to MD at [facility] earlier today

## 2012-08-31 DIAGNOSIS — M129 Arthropathy, unspecified: Secondary | ICD-10-CM | POA: Diagnosis not present

## 2012-08-31 DIAGNOSIS — Z8679 Personal history of other diseases of the circulatory system: Secondary | ICD-10-CM | POA: Diagnosis not present

## 2012-08-31 DIAGNOSIS — E119 Type 2 diabetes mellitus without complications: Secondary | ICD-10-CM | POA: Diagnosis not present

## 2012-08-31 DIAGNOSIS — Z01818 Encounter for other preprocedural examination: Secondary | ICD-10-CM | POA: Diagnosis not present

## 2012-09-08 DIAGNOSIS — L908 Other atrophic disorders of skin: Secondary | ICD-10-CM | POA: Diagnosis not present

## 2012-09-08 DIAGNOSIS — Z85828 Personal history of other malignant neoplasm of skin: Secondary | ICD-10-CM | POA: Diagnosis not present

## 2012-09-08 DIAGNOSIS — L918 Other hypertrophic disorders of the skin: Secondary | ICD-10-CM | POA: Diagnosis not present

## 2012-09-08 DIAGNOSIS — L905 Scar conditions and fibrosis of skin: Secondary | ICD-10-CM | POA: Diagnosis not present

## 2012-09-08 DIAGNOSIS — I789 Disease of capillaries, unspecified: Secondary | ICD-10-CM | POA: Diagnosis not present

## 2012-09-08 DIAGNOSIS — C44320 Squamous cell carcinoma of skin of unspecified parts of face: Secondary | ICD-10-CM | POA: Diagnosis not present

## 2012-09-10 DIAGNOSIS — D0460 Carcinoma in situ of skin of unspecified upper limb, including shoulder: Secondary | ICD-10-CM | POA: Diagnosis not present

## 2012-09-18 DIAGNOSIS — I498 Other specified cardiac arrhythmias: Secondary | ICD-10-CM | POA: Diagnosis not present

## 2012-09-18 DIAGNOSIS — K219 Gastro-esophageal reflux disease without esophagitis: Secondary | ICD-10-CM | POA: Diagnosis not present

## 2012-09-18 DIAGNOSIS — I10 Essential (primary) hypertension: Secondary | ICD-10-CM | POA: Diagnosis not present

## 2012-09-18 DIAGNOSIS — Z950 Presence of cardiac pacemaker: Secondary | ICD-10-CM | POA: Diagnosis not present

## 2012-09-18 DIAGNOSIS — R269 Unspecified abnormalities of gait and mobility: Secondary | ICD-10-CM | POA: Diagnosis not present

## 2012-09-18 DIAGNOSIS — M25569 Pain in unspecified knee: Secondary | ICD-10-CM | POA: Diagnosis not present

## 2012-10-02 DIAGNOSIS — H9190 Unspecified hearing loss, unspecified ear: Secondary | ICD-10-CM | POA: Diagnosis present

## 2012-10-02 DIAGNOSIS — F3289 Other specified depressive episodes: Secondary | ICD-10-CM | POA: Diagnosis present

## 2012-10-02 DIAGNOSIS — M6281 Muscle weakness (generalized): Secondary | ICD-10-CM | POA: Diagnosis not present

## 2012-10-02 DIAGNOSIS — R269 Unspecified abnormalities of gait and mobility: Secondary | ICD-10-CM | POA: Diagnosis not present

## 2012-10-02 DIAGNOSIS — I456 Pre-excitation syndrome: Secondary | ICD-10-CM | POA: Diagnosis present

## 2012-10-02 DIAGNOSIS — K219 Gastro-esophageal reflux disease without esophagitis: Secondary | ICD-10-CM | POA: Diagnosis present

## 2012-10-02 DIAGNOSIS — Z5189 Encounter for other specified aftercare: Secondary | ICD-10-CM | POA: Diagnosis not present

## 2012-10-02 DIAGNOSIS — M1710 Unilateral primary osteoarthritis, unspecified knee: Secondary | ICD-10-CM | POA: Diagnosis not present

## 2012-10-02 DIAGNOSIS — E1149 Type 2 diabetes mellitus with other diabetic neurological complication: Secondary | ICD-10-CM | POA: Diagnosis present

## 2012-10-02 DIAGNOSIS — G4733 Obstructive sleep apnea (adult) (pediatric): Secondary | ICD-10-CM | POA: Diagnosis present

## 2012-10-02 DIAGNOSIS — E1142 Type 2 diabetes mellitus with diabetic polyneuropathy: Secondary | ICD-10-CM | POA: Diagnosis present

## 2012-10-02 DIAGNOSIS — Z950 Presence of cardiac pacemaker: Secondary | ICD-10-CM | POA: Diagnosis not present

## 2012-10-02 DIAGNOSIS — Z471 Aftercare following joint replacement surgery: Secondary | ICD-10-CM | POA: Diagnosis not present

## 2012-10-02 DIAGNOSIS — M25469 Effusion, unspecified knee: Secondary | ICD-10-CM | POA: Diagnosis not present

## 2012-10-02 DIAGNOSIS — I442 Atrioventricular block, complete: Secondary | ICD-10-CM | POA: Diagnosis present

## 2012-10-02 DIAGNOSIS — F329 Major depressive disorder, single episode, unspecified: Secondary | ICD-10-CM | POA: Diagnosis present

## 2012-10-02 DIAGNOSIS — I10 Essential (primary) hypertension: Secondary | ICD-10-CM | POA: Diagnosis present

## 2012-10-02 DIAGNOSIS — E785 Hyperlipidemia, unspecified: Secondary | ICD-10-CM | POA: Diagnosis present

## 2012-10-02 DIAGNOSIS — Z96659 Presence of unspecified artificial knee joint: Secondary | ICD-10-CM | POA: Diagnosis not present

## 2012-10-02 DIAGNOSIS — M2340 Loose body in knee, unspecified knee: Secondary | ICD-10-CM | POA: Diagnosis not present

## 2012-10-02 DIAGNOSIS — I7090 Unspecified atherosclerosis: Secondary | ICD-10-CM | POA: Diagnosis not present

## 2012-10-05 ENCOUNTER — Encounter: Payer: Self-pay | Admitting: Internal Medicine

## 2012-10-05 DIAGNOSIS — M1990 Unspecified osteoarthritis, unspecified site: Secondary | ICD-10-CM | POA: Diagnosis not present

## 2012-10-05 DIAGNOSIS — Z950 Presence of cardiac pacemaker: Secondary | ICD-10-CM | POA: Diagnosis not present

## 2012-10-05 DIAGNOSIS — G9340 Encephalopathy, unspecified: Secondary | ICD-10-CM | POA: Diagnosis not present

## 2012-10-05 DIAGNOSIS — Z5189 Encounter for other specified aftercare: Secondary | ICD-10-CM | POA: Diagnosis not present

## 2012-10-05 DIAGNOSIS — M25469 Effusion, unspecified knee: Secondary | ICD-10-CM | POA: Diagnosis not present

## 2012-10-05 DIAGNOSIS — I442 Atrioventricular block, complete: Secondary | ICD-10-CM | POA: Diagnosis not present

## 2012-10-05 DIAGNOSIS — R269 Unspecified abnormalities of gait and mobility: Secondary | ICD-10-CM | POA: Diagnosis not present

## 2012-10-05 DIAGNOSIS — E1149 Type 2 diabetes mellitus with other diabetic neurological complication: Secondary | ICD-10-CM | POA: Diagnosis not present

## 2012-10-05 DIAGNOSIS — Z4682 Encounter for fitting and adjustment of non-vascular catheter: Secondary | ICD-10-CM | POA: Diagnosis not present

## 2012-10-05 DIAGNOSIS — M6281 Muscle weakness (generalized): Secondary | ICD-10-CM | POA: Diagnosis not present

## 2012-10-05 DIAGNOSIS — E119 Type 2 diabetes mellitus without complications: Secondary | ICD-10-CM | POA: Diagnosis not present

## 2012-10-05 DIAGNOSIS — Z471 Aftercare following joint replacement surgery: Secondary | ICD-10-CM | POA: Diagnosis not present

## 2012-10-05 DIAGNOSIS — E1142 Type 2 diabetes mellitus with diabetic polyneuropathy: Secondary | ICD-10-CM | POA: Diagnosis not present

## 2012-10-05 DIAGNOSIS — I10 Essential (primary) hypertension: Secondary | ICD-10-CM | POA: Diagnosis not present

## 2012-10-05 DIAGNOSIS — Z96659 Presence of unspecified artificial knee joint: Secondary | ICD-10-CM | POA: Diagnosis not present

## 2012-10-06 DIAGNOSIS — I10 Essential (primary) hypertension: Secondary | ICD-10-CM | POA: Diagnosis not present

## 2012-10-06 DIAGNOSIS — M1990 Unspecified osteoarthritis, unspecified site: Secondary | ICD-10-CM | POA: Diagnosis not present

## 2012-10-06 DIAGNOSIS — G9340 Encephalopathy, unspecified: Secondary | ICD-10-CM | POA: Diagnosis not present

## 2012-10-14 DIAGNOSIS — I10 Essential (primary) hypertension: Secondary | ICD-10-CM | POA: Diagnosis not present

## 2012-10-14 DIAGNOSIS — E119 Type 2 diabetes mellitus without complications: Secondary | ICD-10-CM | POA: Diagnosis not present

## 2012-10-16 DIAGNOSIS — Z4682 Encounter for fitting and adjustment of non-vascular catheter: Secondary | ICD-10-CM | POA: Diagnosis not present

## 2012-10-16 DIAGNOSIS — M25469 Effusion, unspecified knee: Secondary | ICD-10-CM | POA: Diagnosis not present

## 2012-10-16 DIAGNOSIS — Z96659 Presence of unspecified artificial knee joint: Secondary | ICD-10-CM | POA: Diagnosis not present

## 2012-10-23 ENCOUNTER — Encounter: Payer: Self-pay | Admitting: Internal Medicine

## 2012-10-26 ENCOUNTER — Encounter: Payer: Self-pay | Admitting: Orthopedic Surgery

## 2012-10-26 DIAGNOSIS — IMO0001 Reserved for inherently not codable concepts without codable children: Secondary | ICD-10-CM | POA: Diagnosis not present

## 2012-10-26 DIAGNOSIS — M6281 Muscle weakness (generalized): Secondary | ICD-10-CM | POA: Diagnosis not present

## 2012-10-26 DIAGNOSIS — R262 Difficulty in walking, not elsewhere classified: Secondary | ICD-10-CM | POA: Diagnosis not present

## 2012-10-26 DIAGNOSIS — M25669 Stiffness of unspecified knee, not elsewhere classified: Secondary | ICD-10-CM | POA: Diagnosis not present

## 2012-10-28 DIAGNOSIS — R262 Difficulty in walking, not elsewhere classified: Secondary | ICD-10-CM | POA: Diagnosis not present

## 2012-10-28 DIAGNOSIS — M25669 Stiffness of unspecified knee, not elsewhere classified: Secondary | ICD-10-CM | POA: Diagnosis not present

## 2012-10-28 DIAGNOSIS — IMO0001 Reserved for inherently not codable concepts without codable children: Secondary | ICD-10-CM | POA: Diagnosis not present

## 2012-10-28 DIAGNOSIS — M6281 Muscle weakness (generalized): Secondary | ICD-10-CM | POA: Diagnosis not present

## 2012-11-02 DIAGNOSIS — R262 Difficulty in walking, not elsewhere classified: Secondary | ICD-10-CM | POA: Diagnosis not present

## 2012-11-02 DIAGNOSIS — M6281 Muscle weakness (generalized): Secondary | ICD-10-CM | POA: Diagnosis not present

## 2012-11-02 DIAGNOSIS — M25669 Stiffness of unspecified knee, not elsewhere classified: Secondary | ICD-10-CM | POA: Diagnosis not present

## 2012-11-02 DIAGNOSIS — IMO0001 Reserved for inherently not codable concepts without codable children: Secondary | ICD-10-CM | POA: Diagnosis not present

## 2012-11-04 DIAGNOSIS — R262 Difficulty in walking, not elsewhere classified: Secondary | ICD-10-CM | POA: Diagnosis not present

## 2012-11-04 DIAGNOSIS — IMO0001 Reserved for inherently not codable concepts without codable children: Secondary | ICD-10-CM | POA: Diagnosis not present

## 2012-11-04 DIAGNOSIS — M25669 Stiffness of unspecified knee, not elsewhere classified: Secondary | ICD-10-CM | POA: Diagnosis not present

## 2012-11-04 DIAGNOSIS — M6281 Muscle weakness (generalized): Secondary | ICD-10-CM | POA: Diagnosis not present

## 2012-11-09 DIAGNOSIS — IMO0001 Reserved for inherently not codable concepts without codable children: Secondary | ICD-10-CM | POA: Diagnosis not present

## 2012-11-09 DIAGNOSIS — M25669 Stiffness of unspecified knee, not elsewhere classified: Secondary | ICD-10-CM | POA: Diagnosis not present

## 2012-11-09 DIAGNOSIS — M6281 Muscle weakness (generalized): Secondary | ICD-10-CM | POA: Diagnosis not present

## 2012-11-09 DIAGNOSIS — R262 Difficulty in walking, not elsewhere classified: Secondary | ICD-10-CM | POA: Diagnosis not present

## 2012-11-22 ENCOUNTER — Encounter: Payer: Self-pay | Admitting: Orthopedic Surgery

## 2012-11-27 ENCOUNTER — Encounter: Payer: Self-pay | Admitting: Internal Medicine

## 2012-11-27 ENCOUNTER — Ambulatory Visit (INDEPENDENT_AMBULATORY_CARE_PROVIDER_SITE_OTHER): Payer: Medicare Other | Admitting: Internal Medicine

## 2012-11-27 VITALS — BP 130/68 | HR 80 | Ht 69.0 in | Wt 183.0 lb

## 2012-11-27 DIAGNOSIS — I442 Atrioventricular block, complete: Secondary | ICD-10-CM

## 2012-11-27 DIAGNOSIS — I10 Essential (primary) hypertension: Secondary | ICD-10-CM

## 2012-11-27 DIAGNOSIS — Z950 Presence of cardiac pacemaker: Secondary | ICD-10-CM | POA: Diagnosis not present

## 2012-11-27 LAB — PACEMAKER DEVICE OBSERVATION
AL IMPEDENCE PM: 430 Ohm
BAMS-0001: 150 {beats}/min
BAMS-0003: 70 {beats}/min
BATTERY VOLTAGE: 2.95 V
VENTRICULAR PACING PM: 53

## 2012-11-27 MED ORDER — METOPROLOL SUCCINATE ER 25 MG PO TB24: 25.0000 mg | ORAL_TABLET | Freq: Every day | ORAL | Status: DC

## 2012-11-27 NOTE — Progress Notes (Signed)
PCP: Tamsen Roers, MD  Howard [name] is a 78 y.o. male who presents today for routine electrophysiology followup.  Since last being seen in our clinic, the patient reports doing very well.  Today, he denies symptoms of palpitations, chest pain, shortness of breath,  lower extremity edema, dizziness, presyncope, or syncope.  The patient is otherwise without complaint today.   Past Medical History  Diagnosis Date  . Hyperlipidemia   . Hypertension   . Pacemaker 11/30/10    PPM- St Jude implanted by [facility]   Past Surgical History  Procedure Date  . Ruptured disc     1962 and 1998  . Cervical fusion   . Knee surgery     left knee 1991 and 1992; right knee 1995  . Prostate surgery     cancer--1998  . Replacement total knee     2004  . Hand surgery     right 1993; left 2005  . Back surgery     2011  . Pacemaker insertion     PPM-- St Jude 11/30/10 by Fawn Kirk  . Catheter ablation 1991    for WPW    Current Outpatient Prescriptions  Medication Sig Dispense Refill  . amLODipine (NORVASC) 5 MG tablet Take 1 tablet (5 mg total) by mouth daily.  90 tablet  3  . aspirin 81 MG tablet Take 81 mg by mouth daily.        Marland Kitchen buPROPion (WELLBUTRIN XL) 300 MG 24 hr tablet Take 300 mg by mouth daily.      . Cholecalciferol (VITAMIN D3) 1000 UNITS CAPS Take by mouth. 1 po daily       . hydrochlorothiazide 25 MG tablet Take 1 tablet (25 mg total) by mouth daily.  90 tablet  3  . metFORMIN (GLUCOPHAGE) 500 MG tablet Take 500 mg by mouth daily with breakfast.       . metoprolol succinate (TOPROL XL) 25 MG 24 hr tablet Take 1 tablet (25 mg total) by mouth daily.  90 tablet  4  . Multiple Vitamin (MULTIVITAMIN) capsule Take 1 capsule by mouth daily.        Marland Kitchen omeprazole (PRILOSEC) 40 MG capsule Take 40 mg by mouth 2 (two) times daily.      . simvastatin (ZOCOR) 20 MG tablet Take 20 mg by mouth at bedtime.          Physical Exam: Filed Vitals:   11/27/12 1023  BP: 130/68  Pulse: 80  Height: 5\' 9"   (1.753 m)  Weight: 183 lb (83.008 kg)  SpO2: 96%    GEN- The patient is well appearing, alert and oriented x 3 today.   Head- normocephalic, atraumatic Eyes-  Sclera clear, conjunctiva pink Ears- hearing intact Oropharynx- clear Lungs- Clear to ausculation bilaterally, normal work of breathing Chest- pacemaker pocket is well healed Heart- Regular rate and rhythm, no murmurs, rubs or gallops, PMI not laterally displaced GI- soft, NT, ND, + BS Extremities- no clubbing, cyanosis, or edema  Pacemaker interrogation- reviewed in detail today,  See PACEART report  Assessment and Plan:   Complete heart block  Normal pacemaker function  See Pace Art report  No changes today  Merlin checks every 3 months, return in 12 months  Essential hypertension  Stable No change required today

## 2012-11-27 NOTE — Patient Instructions (Addendum)
Remote monitoring is used to monitor your Pacemaker of ICD from home. This monitoring reduces the number of office visits required to check your device to one time per year. It allows [place] to keep an eye on the functioning of your device to ensure it is working properly. You are scheduled for a device check from home on March 01, 2013. You may send your transmission at any time that day. If you have a wireless device, the transmission will be sent automatically. After your physician reviews your transmission, you will receive a postcard with your next transmission date.  Your physician wants you to follow-up in: 1 year with Dr Johney Frame. You will receive a reminder letter in the mail two months in advance. If you don't receive a letter, please call our office to schedule the follow-up appointment.

## 2012-12-10 DIAGNOSIS — L570 Actinic keratosis: Secondary | ICD-10-CM | POA: Diagnosis not present

## 2012-12-10 DIAGNOSIS — Z85828 Personal history of other malignant neoplasm of skin: Secondary | ICD-10-CM | POA: Diagnosis not present

## 2012-12-10 DIAGNOSIS — D0460 Carcinoma in situ of skin of unspecified upper limb, including shoulder: Secondary | ICD-10-CM | POA: Diagnosis not present

## 2012-12-10 DIAGNOSIS — D485 Neoplasm of uncertain behavior of skin: Secondary | ICD-10-CM | POA: Diagnosis not present

## 2012-12-28 DIAGNOSIS — F411 Generalized anxiety disorder: Secondary | ICD-10-CM | POA: Diagnosis not present

## 2012-12-28 DIAGNOSIS — F3342 Major depressive disorder, recurrent, in full remission: Secondary | ICD-10-CM | POA: Diagnosis not present

## 2013-01-05 DIAGNOSIS — D0460 Carcinoma in situ of skin of unspecified upper limb, including shoulder: Secondary | ICD-10-CM | POA: Diagnosis not present

## 2013-01-05 DIAGNOSIS — C44529 Squamous cell carcinoma of skin of other part of trunk: Secondary | ICD-10-CM | POA: Diagnosis not present

## 2013-01-25 DIAGNOSIS — R32 Unspecified urinary incontinence: Secondary | ICD-10-CM | POA: Diagnosis not present

## 2013-01-25 DIAGNOSIS — Z8546 Personal history of malignant neoplasm of prostate: Secondary | ICD-10-CM | POA: Diagnosis not present

## 2013-01-25 DIAGNOSIS — E7800 Pure hypercholesterolemia, unspecified: Secondary | ICD-10-CM | POA: Diagnosis not present

## 2013-01-25 DIAGNOSIS — Z8679 Personal history of other diseases of the circulatory system: Secondary | ICD-10-CM | POA: Diagnosis not present

## 2013-01-25 DIAGNOSIS — E119 Type 2 diabetes mellitus without complications: Secondary | ICD-10-CM | POA: Diagnosis not present

## 2013-01-25 DIAGNOSIS — K219 Gastro-esophageal reflux disease without esophagitis: Secondary | ICD-10-CM | POA: Diagnosis not present

## 2013-01-25 DIAGNOSIS — N390 Urinary tract infection, site not specified: Secondary | ICD-10-CM | POA: Diagnosis not present

## 2013-02-01 ENCOUNTER — Ambulatory Visit: Payer: Self-pay | Admitting: Family Medicine

## 2013-02-01 DIAGNOSIS — I714 Abdominal aortic aneurysm, without rupture, unspecified: Secondary | ICD-10-CM | POA: Diagnosis not present

## 2013-02-25 DIAGNOSIS — L570 Actinic keratosis: Secondary | ICD-10-CM | POA: Diagnosis not present

## 2013-02-25 DIAGNOSIS — C44221 Squamous cell carcinoma of skin of unspecified ear and external auricular canal: Secondary | ICD-10-CM | POA: Diagnosis not present

## 2013-03-01 ENCOUNTER — Encounter: Payer: Self-pay | Admitting: Internal Medicine

## 2013-03-01 ENCOUNTER — Other Ambulatory Visit: Payer: Self-pay | Admitting: Internal Medicine

## 2013-03-01 ENCOUNTER — Ambulatory Visit (INDEPENDENT_AMBULATORY_CARE_PROVIDER_SITE_OTHER): Payer: Medicare Other | Admitting: *Deleted

## 2013-03-01 DIAGNOSIS — I442 Atrioventricular block, complete: Secondary | ICD-10-CM

## 2013-03-01 DIAGNOSIS — Z950 Presence of cardiac pacemaker: Secondary | ICD-10-CM

## 2013-03-01 LAB — REMOTE PACEMAKER DEVICE
AL AMPLITUDE: 2.8 mv
BAMS-0001: 150 {beats}/min
BAMS-0003: 70 {beats}/min
BRDY-0002RV: 60 {beats}/min
DEVICE MODEL PM: 7196739
RV LEAD AMPLITUDE: 12 mv
RV LEAD IMPEDENCE PM: 530 Ohm
RV LEAD THRESHOLD: 1.625 V

## 2013-03-05 ENCOUNTER — Encounter: Payer: Self-pay | Admitting: *Deleted

## 2013-03-05 DIAGNOSIS — E119 Type 2 diabetes mellitus without complications: Secondary | ICD-10-CM | POA: Diagnosis not present

## 2013-03-05 DIAGNOSIS — Z86010 Personal history of colonic polyps: Secondary | ICD-10-CM | POA: Diagnosis not present

## 2013-03-09 DIAGNOSIS — L723 Sebaceous cyst: Secondary | ICD-10-CM | POA: Diagnosis not present

## 2013-04-07 DIAGNOSIS — H35389 Toxic maculopathy, unspecified eye: Secondary | ICD-10-CM | POA: Diagnosis not present

## 2013-04-12 ENCOUNTER — Telehealth: Payer: Self-pay | Admitting: Internal Medicine

## 2013-04-12 NOTE — Telephone Encounter (Signed)
Spoke w/wife to let know should be fine.

## 2013-04-12 NOTE — Telephone Encounter (Signed)
New problem   Pt is going out of town for a month and want to know if he can go that long without machine. Please call pt.

## 2013-04-19 ENCOUNTER — Telehealth: Payer: Self-pay | Admitting: Internal Medicine

## 2013-04-19 NOTE — Telephone Encounter (Signed)
New Problem:    Patient's wife called in because her husband purchased a new smart phone and there were instructions that he should not carry it in a pocket above his heart.  Please call back and feel free to leave a message.

## 2013-04-19 NOTE — Telephone Encounter (Signed)
Phone will not hurt his device  Left message on his machine

## 2013-05-31 ENCOUNTER — Ambulatory Visit: Payer: Self-pay | Admitting: Unknown Physician Specialty

## 2013-05-31 ENCOUNTER — Ambulatory Visit (INDEPENDENT_AMBULATORY_CARE_PROVIDER_SITE_OTHER): Payer: Medicare Other | Admitting: *Deleted

## 2013-05-31 DIAGNOSIS — Z09 Encounter for follow-up examination after completed treatment for conditions other than malignant neoplasm: Secondary | ICD-10-CM | POA: Diagnosis not present

## 2013-05-31 DIAGNOSIS — I442 Atrioventricular block, complete: Secondary | ICD-10-CM | POA: Diagnosis not present

## 2013-05-31 DIAGNOSIS — Z7982 Long term (current) use of aspirin: Secondary | ICD-10-CM | POA: Diagnosis not present

## 2013-05-31 DIAGNOSIS — I714 Abdominal aortic aneurysm, without rupture, unspecified: Secondary | ICD-10-CM | POA: Diagnosis not present

## 2013-05-31 DIAGNOSIS — G4730 Sleep apnea, unspecified: Secondary | ICD-10-CM | POA: Diagnosis not present

## 2013-05-31 DIAGNOSIS — N400 Enlarged prostate without lower urinary tract symptoms: Secondary | ICD-10-CM | POA: Diagnosis not present

## 2013-05-31 DIAGNOSIS — Z86010 Personal history of colonic polyps: Secondary | ICD-10-CM | POA: Diagnosis not present

## 2013-05-31 DIAGNOSIS — Z87891 Personal history of nicotine dependence: Secondary | ICD-10-CM | POA: Diagnosis not present

## 2013-05-31 DIAGNOSIS — Z882 Allergy status to sulfonamides status: Secondary | ICD-10-CM | POA: Diagnosis not present

## 2013-05-31 DIAGNOSIS — Z950 Presence of cardiac pacemaker: Secondary | ICD-10-CM | POA: Diagnosis not present

## 2013-05-31 DIAGNOSIS — E559 Vitamin D deficiency, unspecified: Secondary | ICD-10-CM | POA: Diagnosis not present

## 2013-05-31 DIAGNOSIS — D126 Benign neoplasm of colon, unspecified: Secondary | ICD-10-CM | POA: Diagnosis not present

## 2013-05-31 DIAGNOSIS — Z79899 Other long term (current) drug therapy: Secondary | ICD-10-CM | POA: Diagnosis not present

## 2013-05-31 DIAGNOSIS — Z96659 Presence of unspecified artificial knee joint: Secondary | ICD-10-CM | POA: Diagnosis not present

## 2013-05-31 DIAGNOSIS — M1990 Unspecified osteoarthritis, unspecified site: Secondary | ICD-10-CM | POA: Diagnosis not present

## 2013-05-31 DIAGNOSIS — K648 Other hemorrhoids: Secondary | ICD-10-CM | POA: Diagnosis not present

## 2013-05-31 DIAGNOSIS — E785 Hyperlipidemia, unspecified: Secondary | ICD-10-CM | POA: Diagnosis not present

## 2013-05-31 DIAGNOSIS — R059 Cough, unspecified: Secondary | ICD-10-CM | POA: Diagnosis not present

## 2013-05-31 DIAGNOSIS — K219 Gastro-esophageal reflux disease without esophagitis: Secondary | ICD-10-CM | POA: Diagnosis not present

## 2013-05-31 DIAGNOSIS — I4460 Unspecified fascicular block: Secondary | ICD-10-CM | POA: Diagnosis not present

## 2013-05-31 DIAGNOSIS — Z1211 Encounter for screening for malignant neoplasm of colon: Secondary | ICD-10-CM | POA: Diagnosis not present

## 2013-05-31 DIAGNOSIS — I10 Essential (primary) hypertension: Secondary | ICD-10-CM | POA: Diagnosis not present

## 2013-05-31 LAB — REMOTE PACEMAKER DEVICE
AL AMPLITUDE: 3.4 mv
AL IMPEDENCE PM: 430 Ohm
ATRIAL PACING PM: 2.7
BAMS-0001: 150 {beats}/min
BAMS-0003: 70 {beats}/min
DEVICE MODEL PM: 7196739
RV LEAD IMPEDENCE PM: 490 Ohm
RV LEAD THRESHOLD: 2 V

## 2013-06-04 DIAGNOSIS — G4733 Obstructive sleep apnea (adult) (pediatric): Secondary | ICD-10-CM | POA: Diagnosis not present

## 2013-06-14 DIAGNOSIS — Z96649 Presence of unspecified artificial hip joint: Secondary | ICD-10-CM | POA: Diagnosis not present

## 2013-06-14 DIAGNOSIS — M25569 Pain in unspecified knee: Secondary | ICD-10-CM | POA: Diagnosis not present

## 2013-06-14 DIAGNOSIS — Z96659 Presence of unspecified artificial knee joint: Secondary | ICD-10-CM | POA: Diagnosis not present

## 2013-06-29 NOTE — Progress Notes (Signed)
Pt scheduled for 07-01-13 @ 930 with device clinic.

## 2013-07-01 ENCOUNTER — Ambulatory Visit (INDEPENDENT_AMBULATORY_CARE_PROVIDER_SITE_OTHER): Payer: Medicare Other | Admitting: *Deleted

## 2013-07-01 DIAGNOSIS — Z950 Presence of cardiac pacemaker: Secondary | ICD-10-CM

## 2013-07-01 DIAGNOSIS — I442 Atrioventricular block, complete: Secondary | ICD-10-CM

## 2013-07-01 LAB — PACEMAKER DEVICE OBSERVATION
AL IMPEDENCE PM: 462.5 Ohm
BAMS-0001: 150 {beats}/min
BRDY-0004RV: 130 {beats}/min
DEVICE MODEL PM: 7196739
RV LEAD THRESHOLD: 1 V

## 2013-07-01 NOTE — Progress Notes (Signed)
Pt called in due to inc in V threshold discovered by home monitoring. Checked V threshold bipolar & unipolar, each with differing pulse widths. Changed V lead to unipolar and lengthened pulse width from 0.13ms to 0.73ms. Turned off V auto capture. Full details in PaceArt Howard [name]

## 2013-07-09 ENCOUNTER — Encounter: Payer: Self-pay | Admitting: Internal Medicine

## 2013-08-18 DIAGNOSIS — L219 Seborrheic dermatitis, unspecified: Secondary | ICD-10-CM | POA: Diagnosis not present

## 2013-08-18 DIAGNOSIS — Z85828 Personal history of other malignant neoplasm of skin: Secondary | ICD-10-CM | POA: Diagnosis not present

## 2013-08-18 DIAGNOSIS — D485 Neoplasm of uncertain behavior of skin: Secondary | ICD-10-CM | POA: Diagnosis not present

## 2013-08-18 DIAGNOSIS — L570 Actinic keratosis: Secondary | ICD-10-CM | POA: Diagnosis not present

## 2013-08-20 DIAGNOSIS — E7800 Pure hypercholesterolemia, unspecified: Secondary | ICD-10-CM | POA: Diagnosis not present

## 2013-08-20 DIAGNOSIS — Z950 Presence of cardiac pacemaker: Secondary | ICD-10-CM | POA: Diagnosis not present

## 2013-08-20 DIAGNOSIS — E119 Type 2 diabetes mellitus without complications: Secondary | ICD-10-CM | POA: Diagnosis not present

## 2013-08-20 DIAGNOSIS — Z8546 Personal history of malignant neoplasm of prostate: Secondary | ICD-10-CM | POA: Diagnosis not present

## 2013-08-24 DIAGNOSIS — Z8546 Personal history of malignant neoplasm of prostate: Secondary | ICD-10-CM | POA: Diagnosis not present

## 2013-08-24 DIAGNOSIS — D045 Carcinoma in situ of skin of trunk: Secondary | ICD-10-CM | POA: Diagnosis not present

## 2013-08-24 DIAGNOSIS — E119 Type 2 diabetes mellitus without complications: Secondary | ICD-10-CM | POA: Diagnosis not present

## 2013-08-24 DIAGNOSIS — D0460 Carcinoma in situ of skin of unspecified upper limb, including shoulder: Secondary | ICD-10-CM | POA: Diagnosis not present

## 2013-08-24 DIAGNOSIS — E785 Hyperlipidemia, unspecified: Secondary | ICD-10-CM | POA: Diagnosis not present

## 2013-08-24 DIAGNOSIS — E7800 Pure hypercholesterolemia, unspecified: Secondary | ICD-10-CM | POA: Diagnosis not present

## 2013-08-24 DIAGNOSIS — Z23 Encounter for immunization: Secondary | ICD-10-CM | POA: Diagnosis not present

## 2013-08-24 DIAGNOSIS — Z950 Presence of cardiac pacemaker: Secondary | ICD-10-CM | POA: Diagnosis not present

## 2013-08-30 ENCOUNTER — Ambulatory Visit (INDEPENDENT_AMBULATORY_CARE_PROVIDER_SITE_OTHER): Payer: Medicare Other | Admitting: *Deleted

## 2013-08-30 DIAGNOSIS — I442 Atrioventricular block, complete: Secondary | ICD-10-CM

## 2013-08-31 LAB — REMOTE PACEMAKER DEVICE
AL IMPEDENCE PM: 460 Ohm
BAMS-0001: 150 {beats}/min
BAMS-0003: 70 {beats}/min
BRDY-0002RA: 60 {beats}/min
DEVICE MODEL PM: 7196739
RV LEAD IMPEDENCE PM: 330 Ohm

## 2013-09-06 ENCOUNTER — Encounter: Payer: Self-pay | Admitting: *Deleted

## 2013-09-27 ENCOUNTER — Encounter: Payer: Self-pay | Admitting: Internal Medicine

## 2013-10-05 DIAGNOSIS — L570 Actinic keratosis: Secondary | ICD-10-CM | POA: Diagnosis not present

## 2013-10-11 ENCOUNTER — Telehealth: Payer: Self-pay | Admitting: Internal Medicine

## 2013-10-11 NOTE — Telephone Encounter (Signed)
New Problem:  Pt's wife states her husband is considering taking an exercise class. Pt's wife wants to know if Dr. Johney Frame would approve of the class. Please advise

## 2013-10-13 NOTE — Telephone Encounter (Signed)
Order faxed back today.

## 2013-11-02 DIAGNOSIS — L570 Actinic keratosis: Secondary | ICD-10-CM | POA: Diagnosis not present

## 2013-11-22 DIAGNOSIS — E785 Hyperlipidemia, unspecified: Secondary | ICD-10-CM | POA: Diagnosis not present

## 2013-11-22 DIAGNOSIS — E119 Type 2 diabetes mellitus without complications: Secondary | ICD-10-CM | POA: Diagnosis not present

## 2013-11-22 DIAGNOSIS — G4733 Obstructive sleep apnea (adult) (pediatric): Secondary | ICD-10-CM | POA: Diagnosis not present

## 2013-11-22 DIAGNOSIS — Z23 Encounter for immunization: Secondary | ICD-10-CM | POA: Diagnosis not present

## 2013-11-22 DIAGNOSIS — Z1331 Encounter for screening for depression: Secondary | ICD-10-CM | POA: Diagnosis not present

## 2013-11-22 DIAGNOSIS — F3289 Other specified depressive episodes: Secondary | ICD-10-CM | POA: Diagnosis not present

## 2013-11-22 DIAGNOSIS — Z1330 Encounter for screening examination for mental health and behavioral disorders, unspecified: Secondary | ICD-10-CM | POA: Diagnosis not present

## 2013-11-22 DIAGNOSIS — F329 Major depressive disorder, single episode, unspecified: Secondary | ICD-10-CM | POA: Diagnosis not present

## 2013-11-22 DIAGNOSIS — I10 Essential (primary) hypertension: Secondary | ICD-10-CM | POA: Diagnosis not present

## 2013-12-10 ENCOUNTER — Ambulatory Visit (INDEPENDENT_AMBULATORY_CARE_PROVIDER_SITE_OTHER): Payer: Medicare Other | Admitting: Internal Medicine

## 2013-12-10 ENCOUNTER — Encounter: Payer: Self-pay | Admitting: Internal Medicine

## 2013-12-10 VITALS — BP 150/78 | HR 68 | Ht 69.0 in | Wt 189.0 lb

## 2013-12-10 DIAGNOSIS — I442 Atrioventricular block, complete: Secondary | ICD-10-CM

## 2013-12-10 DIAGNOSIS — Z950 Presence of cardiac pacemaker: Secondary | ICD-10-CM | POA: Diagnosis not present

## 2013-12-10 DIAGNOSIS — I10 Essential (primary) hypertension: Secondary | ICD-10-CM | POA: Diagnosis not present

## 2013-12-10 NOTE — Progress Notes (Signed)
PCP: Tamsen Roers, MD  Howard [name] is a 79 y.o. male who presents today for routine electrophysiology followup.  Since last being seen in our clinic, the patient reports doing very well.  Today, he denies symptoms of palpitations, chest pain, shortness of breath,  lower extremity edema, dizziness, presyncope, or syncope.  The patient is otherwise without complaint today. He lives in [place].  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  He prefers to have his device followed in our office.  Past Medical History  Diagnosis Date  . Hyperlipidemia   . Hypertension   . Pacemaker 11/30/10    PPM- St Jude implanted by [employer]  . Complete heart block    Past Surgical History  Procedure Laterality Date  . Ruptured disc      1962 and 1998  . Cervical fusion    . Knee surgery      left knee 1991 and 1992; right knee 1995  . Prostate surgery      cancer--1998  . Replacement total knee      2004  . Hand surgery      right 1993; left 2005  . Back surgery      2011  . Pacemaker insertion      PPM-- St Jude 11/30/10 by Fawn Kirk  . Catheter ablation  1991    for WPW    Current Outpatient Prescriptions  Medication Sig Dispense Refill  . amLODipine (NORVASC) 5 MG tablet Take 1 tablet (5 mg total) by mouth daily.  90 tablet  3  . aspirin 81 MG tablet Take 81 mg by mouth daily.        Marland Kitchen buPROPion (WELLBUTRIN XL) 300 MG 24 hr tablet Take 300 mg by mouth daily.      . Cholecalciferol (VITAMIN D3) 1000 UNITS CAPS Take by mouth. 1 po daily       . hydrochlorothiazide 25 MG tablet Take 1 tablet (25 mg total) by mouth daily.  90 tablet  3  . metFORMIN (GLUCOPHAGE) 500 MG tablet Take 500 mg by mouth daily with breakfast.       . metoprolol succinate (TOPROL XL) 25 MG 24 hr tablet Take 1 tablet (25 mg total) by mouth daily.  90 tablet  3  . Multiple Vitamin (MULTIVITAMIN) capsule Take 1 capsule by mouth daily.        Marland Kitchen omeprazole (PRILOSEC) 40 MG capsule Take 40 mg by mouth 2 (two) times daily.       . simvastatin (ZOCOR) 20 MG tablet Take 20 mg by mouth at bedtime.        Lilian Kapur 625 MG tablet Take 3 tablets by mouth 2 (two) times daily.       No current facility-administered medications for this visit.    Physical Exam: Filed Vitals:   12/10/13 1020  BP: 150/78  Pulse: 68  Height: 5\' 9"  (1.753 m)  Weight: 189 lb (85.73 kg)    GEN- The patient is well appearing, alert and oriented x 3 today.   Head- normocephalic, atraumatic Eyes-  Sclera clear, conjunctiva pink Ears- hearing intact Oropharynx- clear Lungs- Clear to ausculation bilaterally, normal work of breathing Chest- pacemaker pocket is well healed Heart- Regular rate and rhythm, no murmurs, rubs or gallops, PMI not laterally displaced GI- soft, NT, ND, + BS Extremities- no clubbing, cyanosis, or edema  Pacemaker interrogation- reviewed in detail today,  See PACEART report  Assessment and Plan:   Complete heart block  Normal pacemaker function  See Pace Art report  No changes today  Merlin checks every 3 months, return in 12 months  Essential hypertension  Stable No change required today    Merlin Return in 1 year

## 2013-12-10 NOTE — Patient Instructions (Signed)
Remote monitoring is used to monitor your pacemaker from home. This monitoring reduces the number of office visits required to check your device to one time per year. It allows [place] to keep an eye on the functioning of your device to ensure it is working properly. You are scheduled for a device check from home on 03-14-2014. You may send your transmission at any time that day. If you have a wireless device, the transmission will be sent automatically. After your physician reviews your transmission, you will receive a postcard with your next transmission date.  Your physician recommends that you schedule a follow-up appointment in: 12 months with Dr.Allred

## 2013-12-13 LAB — MDC_IDC_ENUM_SESS_TYPE_INCLINIC
Brady Statistic RV Percent Paced: 98 %
Implantable Pulse Generator Serial Number: 7196739
Lead Channel Impedance Value: 350 Ohm
Lead Channel Impedance Value: 410 Ohm
Lead Channel Pacing Threshold Amplitude: 1 V
Lead Channel Pacing Threshold Amplitude: 1 V
Lead Channel Pacing Threshold Pulse Width: 0.4 ms
Lead Channel Pacing Threshold Pulse Width: 0.8 ms
Lead Channel Setting Sensing Sensitivity: 4 mV

## 2013-12-21 ENCOUNTER — Other Ambulatory Visit: Payer: Self-pay | Admitting: Internal Medicine

## 2013-12-24 DIAGNOSIS — D485 Neoplasm of uncertain behavior of skin: Secondary | ICD-10-CM | POA: Diagnosis not present

## 2013-12-24 DIAGNOSIS — Z85828 Personal history of other malignant neoplasm of skin: Secondary | ICD-10-CM | POA: Diagnosis not present

## 2013-12-24 DIAGNOSIS — L570 Actinic keratosis: Secondary | ICD-10-CM | POA: Diagnosis not present

## 2014-01-20 DIAGNOSIS — D0439 Carcinoma in situ of skin of other parts of face: Secondary | ICD-10-CM | POA: Diagnosis not present

## 2014-01-20 DIAGNOSIS — D0430 Carcinoma in situ of skin of unspecified part of face: Secondary | ICD-10-CM | POA: Diagnosis not present

## 2014-01-24 DIAGNOSIS — F411 Generalized anxiety disorder: Secondary | ICD-10-CM | POA: Diagnosis not present

## 2014-01-24 DIAGNOSIS — F3342 Major depressive disorder, recurrent, in full remission: Secondary | ICD-10-CM | POA: Diagnosis not present

## 2014-02-16 DIAGNOSIS — E785 Hyperlipidemia, unspecified: Secondary | ICD-10-CM | POA: Diagnosis not present

## 2014-02-16 DIAGNOSIS — Z8546 Personal history of malignant neoplasm of prostate: Secondary | ICD-10-CM | POA: Diagnosis not present

## 2014-02-16 DIAGNOSIS — I10 Essential (primary) hypertension: Secondary | ICD-10-CM | POA: Diagnosis not present

## 2014-02-16 DIAGNOSIS — E119 Type 2 diabetes mellitus without complications: Secondary | ICD-10-CM | POA: Diagnosis not present

## 2014-02-21 DIAGNOSIS — E119 Type 2 diabetes mellitus without complications: Secondary | ICD-10-CM | POA: Diagnosis not present

## 2014-02-21 DIAGNOSIS — G4733 Obstructive sleep apnea (adult) (pediatric): Secondary | ICD-10-CM | POA: Diagnosis not present

## 2014-02-21 DIAGNOSIS — Z1342 Encounter for screening for global developmental delays (milestones): Secondary | ICD-10-CM | POA: Diagnosis not present

## 2014-02-21 DIAGNOSIS — Z23 Encounter for immunization: Secondary | ICD-10-CM | POA: Diagnosis not present

## 2014-02-21 DIAGNOSIS — I10 Essential (primary) hypertension: Secondary | ICD-10-CM | POA: Diagnosis not present

## 2014-02-21 DIAGNOSIS — E785 Hyperlipidemia, unspecified: Secondary | ICD-10-CM | POA: Diagnosis not present

## 2014-02-21 DIAGNOSIS — Z1330 Encounter for screening examination for mental health and behavioral disorders, unspecified: Secondary | ICD-10-CM | POA: Diagnosis not present

## 2014-02-21 DIAGNOSIS — F329 Major depressive disorder, single episode, unspecified: Secondary | ICD-10-CM | POA: Diagnosis not present

## 2014-02-21 DIAGNOSIS — F3289 Other specified depressive episodes: Secondary | ICD-10-CM | POA: Diagnosis not present

## 2014-02-21 DIAGNOSIS — Z1331 Encounter for screening for depression: Secondary | ICD-10-CM | POA: Diagnosis not present

## 2014-02-23 ENCOUNTER — Ambulatory Visit: Payer: Self-pay | Admitting: Family Medicine

## 2014-02-23 DIAGNOSIS — I714 Abdominal aortic aneurysm, without rupture, unspecified: Secondary | ICD-10-CM | POA: Diagnosis not present

## 2014-03-10 DIAGNOSIS — H8110 Benign paroxysmal vertigo, unspecified ear: Secondary | ICD-10-CM | POA: Diagnosis not present

## 2014-03-14 ENCOUNTER — Encounter: Payer: Medicare Other | Admitting: *Deleted

## 2014-04-04 ENCOUNTER — Encounter: Payer: Self-pay | Admitting: *Deleted

## 2014-04-08 ENCOUNTER — Ambulatory Visit (INDEPENDENT_AMBULATORY_CARE_PROVIDER_SITE_OTHER): Payer: Medicare Other

## 2014-04-08 DIAGNOSIS — I442 Atrioventricular block, complete: Secondary | ICD-10-CM | POA: Diagnosis not present

## 2014-04-08 LAB — MDC_IDC_ENUM_SESS_TYPE_REMOTE
Battery Remaining Longevity: 62 mo
Battery Voltage: 2.93 V
Brady Statistic AP VS Percent: 1 %
Brady Statistic AS VP Percent: 95 %
Brady Statistic RA Percent Paced: 4.4 %
Date Time Interrogation Session: 20150417162231
Implantable Pulse Generator Model: 2210
Implantable Pulse Generator Serial Number: 7196739
Lead Channel Impedance Value: 490 Ohm
Lead Channel Pacing Threshold Amplitude: 1 V
Lead Channel Pacing Threshold Pulse Width: 0.4 ms
Lead Channel Pacing Threshold Pulse Width: 0.8 ms
Lead Channel Sensing Intrinsic Amplitude: 11.1 mV
Lead Channel Sensing Intrinsic Amplitude: 4 mV
Lead Channel Setting Pacing Amplitude: 2 V
Lead Channel Setting Pacing Pulse Width: 0.8 ms
MDC IDC MSMT LEADCHNL RA PACING THRESHOLD AMPLITUDE: 1 V
MDC IDC MSMT LEADCHNL RV IMPEDANCE VALUE: 360 Ohm
MDC IDC SET LEADCHNL RV PACING AMPLITUDE: 2.5 V
MDC IDC SET LEADCHNL RV SENSING SENSITIVITY: 4 mV
MDC IDC STAT BRADY AP VP PERCENT: 4.5 %
MDC IDC STAT BRADY AS VS PERCENT: 1 %
MDC IDC STAT BRADY RV PERCENT PACED: 99 %

## 2014-04-15 DIAGNOSIS — E119 Type 2 diabetes mellitus without complications: Secondary | ICD-10-CM | POA: Diagnosis not present

## 2014-04-19 ENCOUNTER — Encounter: Payer: Self-pay | Admitting: *Deleted

## 2014-04-21 ENCOUNTER — Encounter: Payer: Self-pay | Admitting: Internal Medicine

## 2014-04-21 DIAGNOSIS — C44529 Squamous cell carcinoma of skin of other part of trunk: Secondary | ICD-10-CM | POA: Diagnosis not present

## 2014-04-21 DIAGNOSIS — D485 Neoplasm of uncertain behavior of skin: Secondary | ICD-10-CM | POA: Diagnosis not present

## 2014-04-21 DIAGNOSIS — L570 Actinic keratosis: Secondary | ICD-10-CM | POA: Diagnosis not present

## 2014-04-21 DIAGNOSIS — Z85828 Personal history of other malignant neoplasm of skin: Secondary | ICD-10-CM | POA: Diagnosis not present

## 2014-04-21 DIAGNOSIS — C44319 Basal cell carcinoma of skin of other parts of face: Secondary | ICD-10-CM | POA: Diagnosis not present

## 2014-04-21 DIAGNOSIS — C44611 Basal cell carcinoma of skin of unspecified upper limb, including shoulder: Secondary | ICD-10-CM | POA: Diagnosis not present

## 2014-05-25 DIAGNOSIS — I10 Essential (primary) hypertension: Secondary | ICD-10-CM | POA: Diagnosis not present

## 2014-05-25 DIAGNOSIS — M48062 Spinal stenosis, lumbar region with neurogenic claudication: Secondary | ICD-10-CM | POA: Diagnosis not present

## 2014-05-25 DIAGNOSIS — Z6828 Body mass index (BMI) 28.0-28.9, adult: Secondary | ICD-10-CM | POA: Diagnosis not present

## 2014-05-27 ENCOUNTER — Other Ambulatory Visit: Payer: Self-pay | Admitting: Neurosurgery

## 2014-05-27 DIAGNOSIS — M48061 Spinal stenosis, lumbar region without neurogenic claudication: Secondary | ICD-10-CM

## 2014-05-30 ENCOUNTER — Ambulatory Visit
Admission: RE | Admit: 2014-05-30 | Discharge: 2014-05-30 | Disposition: A | Discharge status: Home / Self Care | Payer: Medicare Other | Source: Ambulatory Visit | Attending: Neurosurgery | Admitting: Neurosurgery

## 2014-05-30 VITALS — BP 132/74 | HR 70

## 2014-05-30 DIAGNOSIS — M47817 Spondylosis without myelopathy or radiculopathy, lumbosacral region: Secondary | ICD-10-CM | POA: Diagnosis not present

## 2014-05-30 DIAGNOSIS — M48061 Spinal stenosis, lumbar region without neurogenic claudication: Secondary | ICD-10-CM | POA: Diagnosis not present

## 2014-05-30 DIAGNOSIS — M5137 Other intervertebral disc degeneration, lumbosacral region: Secondary | ICD-10-CM | POA: Diagnosis not present

## 2014-05-30 MED ORDER — ONDANSETRON HCL 4 MG/2ML IJ SOLN
4.0000 mg | Freq: Once | INTRAMUSCULAR | Status: AC
  Administered 2014-05-30: 4 mg via INTRAMUSCULAR

## 2014-05-30 MED ORDER — MEPERIDINE HCL 100 MG/ML IJ SOLN
75.0000 mg | Freq: Once | INTRAMUSCULAR | Status: AC
  Administered 2014-05-30: 75 mg via INTRAMUSCULAR

## 2014-05-30 MED ORDER — IOHEXOL 180 MG/ML  SOLN
20.0000 mL | Freq: Once | INTRAMUSCULAR | Status: AC | PRN
  Administered 2014-05-30: 20 mL via INTRATHECAL

## 2014-05-30 MED ORDER — DIAZEPAM 5 MG PO TABS
5.0000 mg | ORAL_TABLET | Freq: Once | ORAL | Status: AC
  Administered 2014-05-30: 5 mg via ORAL

## 2014-05-30 NOTE — Progress Notes (Signed)
Stop Wellbutrin when appointment was scheduled.  Discharge instructions explained to pt by Sharyn Lull.  JKL RN

## 2014-05-30 NOTE — Discharge Instructions (Addendum)
Myelogram Discharge Instructions  1. Go home and rest quietly for the next 24 hours.  It is important to lie flat for the next 24 hours.  Get up only to go to the restroom.  You may lie in the bed or on a couch on your back, your stomach, your left side or your right side.  You may have one pillow under your head.  You may have pillows between your knees while you are on your side or under your knees while you are on your back.  2. DO NOT drive today.  Recline the seat as far back as it will go, while still wearing your seat belt, on the way home.  3. You may get up to go to the bathroom as needed.  You may sit up for 10 minutes to eat.  You may resume your normal diet and medications unless otherwise indicated.  Drink lots of extra fluids today and tomorrow.  4. The incidence of headache, nausea, or vomiting is about 5% (one in 20 patients).  If you develop a headache, lie flat and drink plenty of fluids until the headache goes away.  Caffeinated beverages may be helpful.  If you develop severe nausea and vomiting or a headache that does not go away with flat bed rest, call [phone].  5. You may resume normal activities after your 24 hours of bed rest is over; however, do not exert yourself strongly or do any heavy lifting tomorrow. If when you get up you have a headache when standing, go back to bed and force fluids for another 24 hours.  6. Call your physician for a follow-up appointment.  The results of your myelogram will be sent directly to your physician by the following day.  7. If you have any questions or if complications develop after you arrive home, please call [phone].  Discharge instructions have been explained to the patient.  The patient, or the person responsible for the patient, fully understands these instructions.      May resume Wellbutrin on May 31, 2014, after 8:30 am.

## 2014-06-01 DIAGNOSIS — I10 Essential (primary) hypertension: Secondary | ICD-10-CM | POA: Diagnosis not present

## 2014-06-01 DIAGNOSIS — M48062 Spinal stenosis, lumbar region with neurogenic claudication: Secondary | ICD-10-CM | POA: Diagnosis not present

## 2014-06-02 ENCOUNTER — Other Ambulatory Visit: Payer: Self-pay | Admitting: Neurosurgery

## 2014-06-02 DIAGNOSIS — C44611 Basal cell carcinoma of skin of unspecified upper limb, including shoulder: Secondary | ICD-10-CM | POA: Diagnosis not present

## 2014-06-02 DIAGNOSIS — C44529 Squamous cell carcinoma of skin of other part of trunk: Secondary | ICD-10-CM | POA: Diagnosis not present

## 2014-06-03 ENCOUNTER — Encounter (HOSPITAL_COMMUNITY): Payer: Self-pay | Admitting: Pharmacy Technician

## 2014-06-06 ENCOUNTER — Encounter (HOSPITAL_COMMUNITY): Payer: Self-pay

## 2014-06-06 ENCOUNTER — Encounter (HOSPITAL_COMMUNITY)
Admission: RE | Admit: 2014-06-06 | Discharge: 2014-06-06 | Disposition: A | Discharge status: Home / Self Care | Payer: Medicare Other | Source: Ambulatory Visit | Attending: Neurosurgery | Admitting: Neurosurgery

## 2014-06-06 ENCOUNTER — Encounter (HOSPITAL_COMMUNITY)
Admission: RE | Admit: 2014-06-06 | Discharge: 2014-06-06 | Disposition: A | Discharge status: Home / Self Care | Payer: Medicare Other | Source: Ambulatory Visit | Attending: Anesthesiology | Admitting: Anesthesiology

## 2014-06-06 DIAGNOSIS — Z79899 Other long term (current) drug therapy: Secondary | ICD-10-CM | POA: Diagnosis not present

## 2014-06-06 DIAGNOSIS — J984 Other disorders of lung: Secondary | ICD-10-CM | POA: Diagnosis not present

## 2014-06-06 DIAGNOSIS — Z8546 Personal history of malignant neoplasm of prostate: Secondary | ICD-10-CM | POA: Diagnosis not present

## 2014-06-06 DIAGNOSIS — Z7982 Long term (current) use of aspirin: Secondary | ICD-10-CM | POA: Diagnosis not present

## 2014-06-06 DIAGNOSIS — Z96659 Presence of unspecified artificial knee joint: Secondary | ICD-10-CM | POA: Diagnosis not present

## 2014-06-06 DIAGNOSIS — I10 Essential (primary) hypertension: Secondary | ICD-10-CM | POA: Diagnosis not present

## 2014-06-06 DIAGNOSIS — Z950 Presence of cardiac pacemaker: Secondary | ICD-10-CM | POA: Diagnosis not present

## 2014-06-06 DIAGNOSIS — Z85828 Personal history of other malignant neoplasm of skin: Secondary | ICD-10-CM | POA: Diagnosis not present

## 2014-06-06 DIAGNOSIS — Z01818 Encounter for other preprocedural examination: Secondary | ICD-10-CM | POA: Diagnosis not present

## 2014-06-06 DIAGNOSIS — G4730 Sleep apnea, unspecified: Secondary | ICD-10-CM | POA: Diagnosis not present

## 2014-06-06 DIAGNOSIS — K219 Gastro-esophageal reflux disease without esophagitis: Secondary | ICD-10-CM | POA: Diagnosis not present

## 2014-06-06 DIAGNOSIS — F329 Major depressive disorder, single episode, unspecified: Secondary | ICD-10-CM | POA: Diagnosis not present

## 2014-06-06 DIAGNOSIS — Z87891 Personal history of nicotine dependence: Secondary | ICD-10-CM | POA: Diagnosis not present

## 2014-06-06 DIAGNOSIS — M48062 Spinal stenosis, lumbar region with neurogenic claudication: Secondary | ICD-10-CM | POA: Diagnosis not present

## 2014-06-06 DIAGNOSIS — I442 Atrioventricular block, complete: Secondary | ICD-10-CM | POA: Diagnosis not present

## 2014-06-06 DIAGNOSIS — M129 Arthropathy, unspecified: Secondary | ICD-10-CM | POA: Diagnosis not present

## 2014-06-06 DIAGNOSIS — E785 Hyperlipidemia, unspecified: Secondary | ICD-10-CM | POA: Diagnosis not present

## 2014-06-06 DIAGNOSIS — F3289 Other specified depressive episodes: Secondary | ICD-10-CM | POA: Diagnosis not present

## 2014-06-06 DIAGNOSIS — I4891 Unspecified atrial fibrillation: Secondary | ICD-10-CM | POA: Diagnosis not present

## 2014-06-06 DIAGNOSIS — E119 Type 2 diabetes mellitus without complications: Secondary | ICD-10-CM | POA: Diagnosis not present

## 2014-06-06 HISTORY — DX: Malignant (primary) neoplasm, unspecified: C80.1

## 2014-06-06 HISTORY — DX: Depression, unspecified: F32.A

## 2014-06-06 HISTORY — DX: Sleep apnea, unspecified: G47.30

## 2014-06-06 HISTORY — DX: Type 2 diabetes mellitus without complications: E11.9

## 2014-06-06 HISTORY — DX: Unspecified osteoarthritis, unspecified site: M19.90

## 2014-06-06 HISTORY — DX: Gastro-esophageal reflux disease without esophagitis: K21.9

## 2014-06-06 HISTORY — DX: Major depressive disorder, single episode, unspecified: F32.9

## 2014-06-06 LAB — CBC WITH DIFFERENTIAL/PLATELET
BASOS PCT: 1 % (ref 0–1)
Basophils Absolute: 0.1 10*3/uL (ref 0.0–0.1)
EOS PCT: 8 % — AB (ref 0–5)
Eosinophils Absolute: 0.5 10*3/uL (ref 0.0–0.7)
HCT: 44.1 % (ref 39.0–52.0)
Hemoglobin: 15 g/dL (ref 13.0–17.0)
LYMPHS PCT: 31 % (ref 12–46)
Lymphs Abs: 2.1 10*3/uL (ref 0.7–4.0)
MCH: 27.2 pg (ref 26.0–34.0)
MCHC: 34 g/dL (ref 30.0–36.0)
MCV: 79.9 fL (ref 78.0–100.0)
Monocytes Absolute: 0.7 10*3/uL (ref 0.1–1.0)
Monocytes Relative: 11 % (ref 3–12)
NEUTROS PCT: 49 % (ref 43–77)
Neutro Abs: 3.4 10*3/uL (ref 1.7–7.7)
PLATELETS: DECREASED 10*3/uL (ref 150–400)
RBC: 5.52 MIL/uL (ref 4.22–5.81)
RDW: 14.9 % (ref 11.5–15.5)
SMEAR REVIEW: DECREASED
WBC: 6.8 10*3/uL (ref 4.0–10.5)

## 2014-06-06 LAB — BASIC METABOLIC PANEL
BUN: 15 mg/dL (ref 6–23)
CO2: 22 mEq/L (ref 19–32)
Calcium: 9.6 mg/dL (ref 8.4–10.5)
Chloride: 99 mEq/L (ref 96–112)
Creatinine, Ser: 0.91 mg/dL (ref 0.50–1.35)
GFR, EST AFRICAN AMERICAN: 90 mL/min — AB (ref >90)
GFR, EST NON AFRICAN AMERICAN: 77 mL/min — AB (ref >90)
Glucose, Bld: 139 mg/dL — ABNORMAL HIGH (ref 70–99)
Potassium: 6 mEq/L — ABNORMAL HIGH (ref 3.7–5.3)
SODIUM: 136 meq/L — AB (ref 137–147)

## 2014-06-06 LAB — SURGICAL PCR SCREEN
MRSA, PCR: NEGATIVE
Staphylococcus aureus: NEGATIVE

## 2014-06-06 MED ORDER — CEFAZOLIN SODIUM-DEXTROSE 2-3 GM-% IV SOLR
2.0000 g | INTRAVENOUS | Status: AC
  Administered 2014-06-07: 2 g via INTRAVENOUS
  Filled 2014-06-06: qty 50

## 2014-06-06 MED ORDER — DEXAMETHASONE SODIUM PHOSPHATE 10 MG/ML IJ SOLN
10.0000 mg | INTRAMUSCULAR | Status: AC
  Administered 2014-06-07: 10 mg via INTRAVENOUS
  Filled 2014-06-06: qty 1

## 2014-06-06 NOTE — Pre-Procedure Instructions (Signed)
Howard [name]  06/06/2014   Your procedure is scheduled on:  Tuesday, June 16th  Report to [facility] at [place] AM.  Call this number if you have problems the morning of surgery: [phone]   Remember:   Do not eat food or drink liquids after midnight.   Take these medicines the morning of surgery with A SIP OF WATER: norvasc, toprol, wellbutrin, prilosec  NO diabetes medication in the morning   Do not wear jewelry.  Do not wear lotions, powders, or perfumes. You may wear deodorant.  Do not shave 48 hours prior to surgery. Men may shave face and neck.  Do not bring valuables to the hospital.  [facility] is not responsible  for any belongings or valuables.               Contacts, dentures or bridgework may not be worn into surgery.  Leave suitcase in the car. After surgery it may be brought to your room.  For patients admitted to the hospital, discharge time is determined by your treatment team.               Patients discharged the day of surgery will not be allowed to drive home.  Please read over the following fact sheets that you were given: Pain Booklet, Coughing and Deep Breathing, MRSA Information and Surgical Site Infection Prevention [place] - Preparing for Surgery  Before surgery, you can play an important role.  Because skin is not sterile, your skin needs to be as free of germs as possible.  You can reduce the number of germs on you skin by washing with CHG (chlorahexidine gluconate) soap before surgery.  CHG is an antiseptic cleaner which kills germs and bonds with the skin to continue killing germs even after washing.  Please DO NOT use if you have an allergy to CHG or antibacterial soaps.  If your skin becomes reddened/irritated stop using the CHG and inform your nurse when you arrive at Short Stay.  Do not shave (including legs and underarms) for at least 48 hours prior to the first CHG shower.  You may shave your face.  Please follow these  instructions carefully:   1.  Shower with CHG Soap the night before surgery and the morning of Surgery.  2.  If you choose to wash your hair, wash your hair first as usual with your normal shampoo.  3.  After you shampoo, rinse your hair and body thoroughly to remove the shampoo.  4.  Use CHG as you would any other liquid soap.  You can apply CHG directly to the skin and wash gently with scrungie or a clean washcloth.  5.  Apply the CHG Soap to your body ONLY FROM THE NECK DOWN.  Do not use on open wounds or open sores.  Avoid contact with your eyes, ears, mouth and genitals (private parts).  Wash genitals (private parts) with your normal soap.  6.  Wash thoroughly, paying special attention to the area where your surgery will be performed.  7.  Thoroughly rinse your body with warm water from the neck down.  8.  DO NOT shower/wash with your normal soap after using and rinsing off the CHG Soap.  9.  Pat yourself dry with a clean towel.            10.  Wear clean pajamas.            11.  Place clean sheets on your  bed the night of your first shower and do not sleep with pets.  Day of Surgery  Do not apply any lotions/deoderants the morning of surgery.  Please wear clean clothes to the hospital/surgery center.

## 2014-06-06 NOTE — Progress Notes (Signed)
Primary physician - dr. Rodena Medin - [facility] Cardiologist - dr. Rayann Heman No ekg within last year

## 2014-06-07 ENCOUNTER — Inpatient Hospital Stay (HOSPITAL_COMMUNITY): Payer: Medicare Other

## 2014-06-07 ENCOUNTER — Encounter (HOSPITAL_COMMUNITY): Payer: Self-pay | Admitting: *Deleted

## 2014-06-07 ENCOUNTER — Encounter (HOSPITAL_COMMUNITY)
Admission: RE | Disposition: A | Discharge status: Home / Self Care | Payer: Self-pay | Source: Ambulatory Visit | Attending: Neurosurgery

## 2014-06-07 ENCOUNTER — Inpatient Hospital Stay (HOSPITAL_COMMUNITY): Payer: Medicare Other | Admitting: Anesthesiology

## 2014-06-07 ENCOUNTER — Ambulatory Visit (HOSPITAL_COMMUNITY)
Admission: RE | Admit: 2014-06-07 | Discharge: 2014-06-07 | Disposition: A | Discharge status: Home / Self Care | Payer: Medicare Other | Source: Ambulatory Visit | Attending: Neurosurgery | Admitting: Neurosurgery

## 2014-06-07 ENCOUNTER — Encounter (HOSPITAL_COMMUNITY): Payer: Medicare Other | Admitting: Anesthesiology

## 2014-06-07 DIAGNOSIS — M129 Arthropathy, unspecified: Secondary | ICD-10-CM | POA: Insufficient documentation

## 2014-06-07 DIAGNOSIS — Z7982 Long term (current) use of aspirin: Secondary | ICD-10-CM | POA: Insufficient documentation

## 2014-06-07 DIAGNOSIS — E119 Type 2 diabetes mellitus without complications: Secondary | ICD-10-CM | POA: Insufficient documentation

## 2014-06-07 DIAGNOSIS — M48062 Spinal stenosis, lumbar region with neurogenic claudication: Principal | ICD-10-CM | POA: Diagnosis present

## 2014-06-07 DIAGNOSIS — K219 Gastro-esophageal reflux disease without esophagitis: Secondary | ICD-10-CM | POA: Insufficient documentation

## 2014-06-07 DIAGNOSIS — Z87891 Personal history of nicotine dependence: Secondary | ICD-10-CM | POA: Insufficient documentation

## 2014-06-07 DIAGNOSIS — E785 Hyperlipidemia, unspecified: Secondary | ICD-10-CM | POA: Diagnosis not present

## 2014-06-07 DIAGNOSIS — Z85828 Personal history of other malignant neoplasm of skin: Secondary | ICD-10-CM | POA: Insufficient documentation

## 2014-06-07 DIAGNOSIS — M48061 Spinal stenosis, lumbar region without neurogenic claudication: Secondary | ICD-10-CM | POA: Diagnosis not present

## 2014-06-07 DIAGNOSIS — G4730 Sleep apnea, unspecified: Secondary | ICD-10-CM | POA: Insufficient documentation

## 2014-06-07 DIAGNOSIS — F3289 Other specified depressive episodes: Secondary | ICD-10-CM | POA: Insufficient documentation

## 2014-06-07 DIAGNOSIS — Z950 Presence of cardiac pacemaker: Secondary | ICD-10-CM | POA: Insufficient documentation

## 2014-06-07 DIAGNOSIS — I10 Essential (primary) hypertension: Secondary | ICD-10-CM | POA: Diagnosis not present

## 2014-06-07 DIAGNOSIS — I442 Atrioventricular block, complete: Secondary | ICD-10-CM | POA: Insufficient documentation

## 2014-06-07 DIAGNOSIS — F329 Major depressive disorder, single episode, unspecified: Secondary | ICD-10-CM | POA: Insufficient documentation

## 2014-06-07 DIAGNOSIS — Z79899 Other long term (current) drug therapy: Secondary | ICD-10-CM | POA: Insufficient documentation

## 2014-06-07 DIAGNOSIS — I4891 Unspecified atrial fibrillation: Secondary | ICD-10-CM | POA: Insufficient documentation

## 2014-06-07 DIAGNOSIS — M519 Unspecified thoracic, thoracolumbar and lumbosacral intervertebral disc disorder: Secondary | ICD-10-CM | POA: Diagnosis not present

## 2014-06-07 DIAGNOSIS — Z96659 Presence of unspecified artificial knee joint: Secondary | ICD-10-CM | POA: Insufficient documentation

## 2014-06-07 DIAGNOSIS — Z8546 Personal history of malignant neoplasm of prostate: Secondary | ICD-10-CM | POA: Insufficient documentation

## 2014-06-07 HISTORY — PX: LUMBAR LAMINECTOMY/DECOMPRESSION MICRODISCECTOMY: SHX5026

## 2014-06-07 LAB — POCT I-STAT EG7
ACID-BASE EXCESS: 2 mmol/L (ref 0.0–2.0)
Bicarbonate: 27.1 mEq/L — ABNORMAL HIGH (ref 20.0–24.0)
Calcium, Ion: 1.26 mmol/L (ref 1.13–1.30)
HCT: 42 % (ref 39.0–52.0)
Hemoglobin: 14.3 g/dL (ref 13.0–17.0)
O2 SAT: 90 %
POTASSIUM: 3.9 meq/L (ref 3.7–5.3)
Sodium: 140 mEq/L (ref 137–147)
TCO2: 28 mmol/L (ref 0–100)
pCO2, Ven: 43.8 mmHg — ABNORMAL LOW (ref 45.0–50.0)
pH, Ven: 7.4 — ABNORMAL HIGH (ref 7.250–7.300)
pO2, Ven: 58 mmHg — ABNORMAL HIGH (ref 30.0–45.0)

## 2014-06-07 LAB — GLUCOSE, CAPILLARY
GLUCOSE-CAPILLARY: 140 mg/dL — AB (ref 70–99)
GLUCOSE-CAPILLARY: 147 mg/dL — AB (ref 70–99)
GLUCOSE-CAPILLARY: 190 mg/dL — AB (ref 70–99)
Glucose-Capillary: 138 mg/dL — ABNORMAL HIGH (ref 70–99)

## 2014-06-07 SURGERY — LUMBAR LAMINECTOMY/DECOMPRESSION MICRODISCECTOMY 1 LEVEL
Anesthesia: General | Site: Back

## 2014-06-07 MED ORDER — THROMBIN 5000 UNITS EX SOLR
CUTANEOUS | Status: DC | PRN
  Administered 2014-06-07 (×4): 5000 [IU] via TOPICAL

## 2014-06-07 MED ORDER — METOPROLOL SUCCINATE ER 25 MG PO TB24
25.0000 mg | ORAL_TABLET | Freq: Every evening | ORAL | Status: DC
  Administered 2014-06-07: 25 mg via ORAL
  Filled 2014-06-07: qty 1

## 2014-06-07 MED ORDER — ASPIRIN EC 81 MG PO TBEC
81.0000 mg | DELAYED_RELEASE_TABLET | Freq: Every day | ORAL | Status: DC
  Administered 2014-06-07: 81 mg via ORAL
  Filled 2014-06-07: qty 1

## 2014-06-07 MED ORDER — CEFAZOLIN SODIUM 1-5 GM-% IV SOLN
1.0000 g | Freq: Three times a day (TID) | INTRAVENOUS | Status: DC
  Administered 2014-06-07: 1 g via INTRAVENOUS
  Filled 2014-06-07: qty 50

## 2014-06-07 MED ORDER — PHENOL 1.4 % MT LIQD: 1.0000 | OROMUCOSAL | Status: DC | PRN

## 2014-06-07 MED ORDER — FENTANYL CITRATE 0.05 MG/ML IJ SOLN
INTRAMUSCULAR | Status: AC
  Filled 2014-06-07: qty 2

## 2014-06-07 MED ORDER — PHENYLEPHRINE HCL 10 MG/ML IJ SOLN
INTRAMUSCULAR | Status: DC | PRN
  Administered 2014-06-07: 40 ug via INTRAVENOUS
  Administered 2014-06-07 (×2): 120 ug via INTRAVENOUS
  Administered 2014-06-07: 80 ug via INTRAVENOUS
  Administered 2014-06-07: 40 ug via INTRAVENOUS
  Administered 2014-06-07 (×3): 80 ug via INTRAVENOUS

## 2014-06-07 MED ORDER — ACETAMINOPHEN 650 MG RE SUPP: 650.0000 mg | RECTAL | Status: DC | PRN

## 2014-06-07 MED ORDER — GLYCOPYRROLATE 0.2 MG/ML IJ SOLN
INTRAMUSCULAR | Status: AC
  Filled 2014-06-07: qty 4

## 2014-06-07 MED ORDER — AMLODIPINE BESYLATE 5 MG PO TABS
5.0000 mg | ORAL_TABLET | Freq: Every day | ORAL | Status: DC
  Filled 2014-06-07: qty 1

## 2014-06-07 MED ORDER — HYDROMORPHONE HCL PF 1 MG/ML IJ SOLN
0.5000 mg | INTRAMUSCULAR | Status: DC | PRN
Start: 2014-06-07 — End: 2014-06-07

## 2014-06-07 MED ORDER — HYDROCODONE-ACETAMINOPHEN 5-325 MG PO TABS: 1.0000 | ORAL_TABLET | ORAL | Status: DC | PRN

## 2014-06-07 MED ORDER — 0.9 % SODIUM CHLORIDE (POUR BTL) OPTIME
TOPICAL | Status: DC | PRN
  Administered 2014-06-07: 1000 mL

## 2014-06-07 MED ORDER — OXYCODONE-ACETAMINOPHEN 5-325 MG PO TABS
ORAL_TABLET | ORAL | Status: AC
  Filled 2014-06-07: qty 2

## 2014-06-07 MED ORDER — HYDROCHLOROTHIAZIDE 25 MG PO TABS
25.0000 mg | ORAL_TABLET | Freq: Every day | ORAL | Status: DC
  Filled 2014-06-07: qty 1

## 2014-06-07 MED ORDER — NEOSTIGMINE METHYLSULFATE 10 MG/10ML IV SOLN
INTRAVENOUS | Status: DC | PRN
  Administered 2014-06-07: 3 mg via INTRAVENOUS

## 2014-06-07 MED ORDER — FENTANYL CITRATE 0.05 MG/ML IJ SOLN: 25.0000 ug | INTRAMUSCULAR | Status: DC | PRN

## 2014-06-07 MED ORDER — HEMOSTATIC AGENTS (NO CHARGE) OPTIME
TOPICAL | Status: DC | PRN
  Administered 2014-06-07 (×2): 1 via TOPICAL

## 2014-06-07 MED ORDER — LIDOCAINE HCL (CARDIAC) 20 MG/ML IV SOLN
INTRAVENOUS | Status: DC | PRN
  Administered 2014-06-07: 100 mg via INTRAVENOUS

## 2014-06-07 MED ORDER — PHENYLEPHRINE 40 MCG/ML (10ML) SYRINGE FOR IV PUSH (FOR BLOOD PRESSURE SUPPORT)
PREFILLED_SYRINGE | INTRAVENOUS | Status: AC
  Filled 2014-06-07: qty 20

## 2014-06-07 MED ORDER — SODIUM CHLORIDE 0.9 % IJ SOLN: 3.0000 mL | INTRAMUSCULAR | Status: DC | PRN

## 2014-06-07 MED ORDER — CYCLOBENZAPRINE HCL 10 MG PO TABS
10.0000 mg | ORAL_TABLET | Freq: Three times a day (TID) | ORAL | Status: DC | PRN
Start: 2014-06-07 — End: 2014-06-07

## 2014-06-07 MED ORDER — PROPOFOL 10 MG/ML IV BOLUS
INTRAVENOUS | Status: DC | PRN
  Administered 2014-06-07: 140 mg via INTRAVENOUS

## 2014-06-07 MED ORDER — GLYCOPYRROLATE 0.2 MG/ML IJ SOLN
INTRAMUSCULAR | Status: DC | PRN
  Administered 2014-06-07: .4 mg via INTRAVENOUS

## 2014-06-07 MED ORDER — SENNA 8.6 MG PO TABS
1.0000 | ORAL_TABLET | Freq: Two times a day (BID) | ORAL | Status: DC
  Administered 2014-06-07: 8.6 mg via ORAL
  Filled 2014-06-07: qty 1

## 2014-06-07 MED ORDER — SODIUM CHLORIDE 0.9 % IR SOLN
Status: DC | PRN
  Administered 2014-06-07: 11:00:00

## 2014-06-07 MED ORDER — OXYCODONE-ACETAMINOPHEN 5-325 MG PO TABS
1.0000 | ORAL_TABLET | ORAL | Status: DC | PRN
  Administered 2014-06-07: 2 via ORAL

## 2014-06-07 MED ORDER — ACETAMINOPHEN 325 MG PO TABS: 650.0000 mg | ORAL_TABLET | ORAL | Status: DC | PRN

## 2014-06-07 MED ORDER — ALUM & MAG HYDROXIDE-SIMETH 200-200-20 MG/5ML PO SUSP: 30.0000 mL | Freq: Four times a day (QID) | ORAL | Status: DC | PRN

## 2014-06-07 MED ORDER — METFORMIN HCL 500 MG PO TABS
500.0000 mg | ORAL_TABLET | Freq: Every day | ORAL | Status: DC
  Administered 2014-06-07: 500 mg via ORAL
  Filled 2014-06-07: qty 1

## 2014-06-07 MED ORDER — ONDANSETRON HCL 4 MG/2ML IJ SOLN
INTRAMUSCULAR | Status: AC
  Filled 2014-06-07: qty 2

## 2014-06-07 MED ORDER — DEXTROSE 5 % IV SOLN
10.0000 mg | INTRAVENOUS | Status: DC | PRN
  Administered 2014-06-07: 10 ug/min via INTRAVENOUS

## 2014-06-07 MED ORDER — CYCLOBENZAPRINE HCL 10 MG PO TABS: 10.0000 mg | ORAL_TABLET | Freq: Three times a day (TID) | ORAL | Status: DC | PRN

## 2014-06-07 MED ORDER — BUPROPION HCL ER (XL) 300 MG PO TB24
300.0000 mg | ORAL_TABLET | Freq: Every day | ORAL | Status: DC
  Filled 2014-06-07: qty 1

## 2014-06-07 MED ORDER — COLESEVELAM HCL 3.75 G PO PACK
3.7500 g | PACK | Freq: Every day | ORAL | Status: DC
  Administered 2014-06-07: 3.8 g via ORAL
  Filled 2014-06-07: qty 1

## 2014-06-07 MED ORDER — BUPIVACAINE HCL (PF) 0.25 % IJ SOLN
INTRAMUSCULAR | Status: DC | PRN
  Administered 2014-06-07: 20 mL

## 2014-06-07 MED ORDER — SODIUM CHLORIDE 0.9 % IV SOLN
250.0000 mL | INTRAVENOUS | Status: DC
  Administered 2014-06-07: 250 mL via INTRAVENOUS

## 2014-06-07 MED ORDER — PANTOPRAZOLE SODIUM 40 MG PO TBEC: 40.0000 mg | DELAYED_RELEASE_TABLET | Freq: Every day | ORAL | Status: DC

## 2014-06-07 MED ORDER — LACTATED RINGERS IV SOLN
INTRAVENOUS | Status: DC
  Administered 2014-06-07 (×2): via INTRAVENOUS

## 2014-06-07 MED ORDER — MENTHOL 3 MG MT LOZG: 1.0000 | LOZENGE | OROMUCOSAL | Status: DC | PRN

## 2014-06-07 MED ORDER — NEOSTIGMINE METHYLSULFATE 10 MG/10ML IV SOLN
INTRAVENOUS | Status: AC
  Filled 2014-06-07: qty 3

## 2014-06-07 MED ORDER — ROCURONIUM BROMIDE 100 MG/10ML IV SOLN
INTRAVENOUS | Status: DC | PRN
  Administered 2014-06-07: 40 mg via INTRAVENOUS
  Administered 2014-06-07: 10 mg via INTRAVENOUS

## 2014-06-07 MED ORDER — FENTANYL CITRATE 0.05 MG/ML IJ SOLN
INTRAMUSCULAR | Status: AC
  Filled 2014-06-07: qty 5

## 2014-06-07 MED ORDER — SODIUM CHLORIDE 0.9 % IJ SOLN: 3.0000 mL | Freq: Two times a day (BID) | INTRAMUSCULAR | Status: DC

## 2014-06-07 MED ORDER — ONDANSETRON HCL 4 MG/2ML IJ SOLN
INTRAMUSCULAR | Status: DC | PRN
  Administered 2014-06-07: 4 mg via INTRAVENOUS

## 2014-06-07 MED ORDER — KETOROLAC TROMETHAMINE 30 MG/ML IJ SOLN
30.0000 mg | Freq: Four times a day (QID) | INTRAMUSCULAR | Status: DC
  Administered 2014-06-07: 30 mg via INTRAVENOUS
  Filled 2014-06-07: qty 1

## 2014-06-07 MED ORDER — SIMVASTATIN 20 MG PO TABS
20.0000 mg | ORAL_TABLET | Freq: Every day | ORAL | Status: DC
  Filled 2014-06-07: qty 1

## 2014-06-07 MED ORDER — ONDANSETRON HCL 4 MG/2ML IJ SOLN: 4.0000 mg | INTRAMUSCULAR | Status: DC | PRN

## 2014-06-07 MED ORDER — KETOROLAC TROMETHAMINE 30 MG/ML IJ SOLN
INTRAMUSCULAR | Status: DC | PRN
  Administered 2014-06-07: 30 mg via INTRAVENOUS

## 2014-06-07 MED ORDER — FENTANYL CITRATE 0.05 MG/ML IJ SOLN
INTRAMUSCULAR | Status: DC | PRN
  Administered 2014-06-07: 50 ug via INTRAVENOUS
  Administered 2014-06-07: 100 ug via INTRAVENOUS

## 2014-06-07 SURGICAL SUPPLY — 59 items
BAG DECANTER FOR FLEXI CONT (MISCELLANEOUS) ×2 IMPLANT
BENZOIN TINCTURE PRP APPL 2/3 (GAUZE/BANDAGES/DRESSINGS) ×2 IMPLANT
BLADE 10 SAFETY STRL DISP (BLADE) ×2 IMPLANT
BLADE SURG ROTATE 9660 (MISCELLANEOUS) IMPLANT
BRUSH SCRUB EZ PLAIN DRY (MISCELLANEOUS) ×2 IMPLANT
BUR CUTTER 7.0 ROUND (BURR) ×2 IMPLANT
BUR MATCHSTICK NEURO 3.0 LAGG (BURR) ×2 IMPLANT
CANISTER SUCT 3000ML (MISCELLANEOUS) ×2 IMPLANT
CONT SPEC 4OZ CLIKSEAL STRL BL (MISCELLANEOUS) ×4 IMPLANT
DECANTER SPIKE VIAL GLASS SM (MISCELLANEOUS) ×2 IMPLANT
DERMABOND ADHESIVE PROPEN (GAUZE/BANDAGES/DRESSINGS) ×1
DERMABOND ADVANCED (GAUZE/BANDAGES/DRESSINGS) ×1
DERMABOND ADVANCED .7 DNX12 (GAUZE/BANDAGES/DRESSINGS) ×1 IMPLANT
DERMABOND ADVANCED .7 DNX6 (GAUZE/BANDAGES/DRESSINGS) ×1 IMPLANT
DRAPE LAPAROTOMY 100X72X124 (DRAPES) ×2 IMPLANT
DRAPE MICROSCOPE ZEISS OPMI (DRAPES) ×2 IMPLANT
DRAPE POUCH INSTRU U-SHP 10X18 (DRAPES) ×2 IMPLANT
DRAPE PROXIMA HALF (DRAPES) IMPLANT
DRAPE SURG 17X23 STRL (DRAPES) ×4 IMPLANT
DURAPREP 26ML APPLICATOR (WOUND CARE) ×2 IMPLANT
ELECT REM PT RETURN 9FT ADLT (ELECTROSURGICAL) ×2
ELECTRODE REM PT RTRN 9FT ADLT (ELECTROSURGICAL) ×1 IMPLANT
GAUZE SPONGE 4X4 16PLY XRAY LF (GAUZE/BANDAGES/DRESSINGS) IMPLANT
GLOVE BIO SURGEON STRL SZ8 (GLOVE) ×2 IMPLANT
GLOVE BIOGEL PI IND STRL 7.0 (GLOVE) ×1 IMPLANT
GLOVE BIOGEL PI INDICATOR 7.0 (GLOVE) ×1
GLOVE ECLIPSE 9.0 STRL (GLOVE) ×2 IMPLANT
GLOVE EXAM NITRILE LRG STRL (GLOVE) IMPLANT
GLOVE EXAM NITRILE MD LF STRL (GLOVE) IMPLANT
GLOVE EXAM NITRILE XL STR (GLOVE) IMPLANT
GLOVE EXAM NITRILE XS STR PU (GLOVE) IMPLANT
GLOVE INDICATOR 7.5 STRL GRN (GLOVE) ×2 IMPLANT
GLOVE INDICATOR 8.5 STRL (GLOVE) ×4 IMPLANT
GLOVE SURG SS PI 7.0 STRL IVOR (GLOVE) ×8 IMPLANT
GOWN STRL REUS W/ TWL LRG LVL3 (GOWN DISPOSABLE) ×1 IMPLANT
GOWN STRL REUS W/ TWL XL LVL3 (GOWN DISPOSABLE) ×4 IMPLANT
GOWN STRL REUS W/TWL 2XL LVL3 (GOWN DISPOSABLE) IMPLANT
GOWN STRL REUS W/TWL LRG LVL3 (GOWN DISPOSABLE) ×1
GOWN STRL REUS W/TWL XL LVL3 (GOWN DISPOSABLE) ×4
KIT BASIN OR (CUSTOM PROCEDURE TRAY) ×2 IMPLANT
KIT ROOM TURNOVER OR (KITS) ×2 IMPLANT
NEEDLE HYPO 22GX1.5 SAFETY (NEEDLE) ×2 IMPLANT
NEEDLE SPNL 22GX3.5 QUINCKE BK (NEEDLE) ×2 IMPLANT
NS IRRIG 1000ML POUR BTL (IV SOLUTION) ×2 IMPLANT
PACK LAMINECTOMY NEURO (CUSTOM PROCEDURE TRAY) ×2 IMPLANT
PAD ARMBOARD 7.5X6 YLW CONV (MISCELLANEOUS) ×6 IMPLANT
PATTIES SURGICAL .5 X.5 (GAUZE/BANDAGES/DRESSINGS) ×2 IMPLANT
PATTIES SURGICAL 1X1 (DISPOSABLE) ×2 IMPLANT
RUBBERBAND STERILE (MISCELLANEOUS) ×4 IMPLANT
SPONGE GAUZE 4X4 12PLY (GAUZE/BANDAGES/DRESSINGS) ×2 IMPLANT
SPONGE SURGIFOAM ABS GEL SZ50 (HEMOSTASIS) ×4 IMPLANT
STRIP CLOSURE SKIN 1/2X4 (GAUZE/BANDAGES/DRESSINGS) ×2 IMPLANT
SUT VIC AB 2-0 CT1 18 (SUTURE) ×2 IMPLANT
SUT VIC AB 3-0 SH 8-18 (SUTURE) ×2 IMPLANT
SYR 20ML ECCENTRIC (SYRINGE) ×2 IMPLANT
TAPE CLOTH SURG 4X10 WHT LF (GAUZE/BANDAGES/DRESSINGS) ×2 IMPLANT
TOWEL OR 17X24 6PK STRL BLUE (TOWEL DISPOSABLE) ×2 IMPLANT
TOWEL OR 17X26 10 PK STRL BLUE (TOWEL DISPOSABLE) ×2 IMPLANT
WATER STERILE IRR 1000ML POUR (IV SOLUTION) ×2 IMPLANT

## 2014-06-07 NOTE — Brief Op Note (Signed)
06/07/2014  11:43 AM  PATIENT:  Howard [name]  80 y.o. male  PRE-OPERATIVE DIAGNOSIS:  stenosis  POST-OPERATIVE DIAGNOSIS:  stenosis  PROCEDURE:  Procedure(s): LUMBAR FOUR TO FIVE LUMBAR LAMINECTOMY/DECOMPRESSION MICRODISCECTOMY 1 LEVEL (N/A)  SURGEON:  Surgeon(s) and Role:    * [name], MD - Primary    * Erline Levine, MD - Assisting  PHYSICIAN ASSISTANT:   ASSISTANTS:    ANESTHESIA:   general  EBL:  Total I/O In: 1000 [I.V.:1000] Out: -   BLOOD ADMINISTERED:none  DRAINS: none   LOCAL MEDICATIONS USED:  MARCAINE     SPECIMEN:  No Specimen  DISPOSITION OF SPECIMEN:  N/A  COUNTS:  YES  TOURNIQUET:  * No tourniquets in log *  DICTATION: .Dragon Dictation  PLAN OF CARE: Admit to inpatient   PATIENT DISPOSITION:  PACU - hemodynamically stable.   Delay start of Pharmacological VTE agent (>24hrs) due to surgical blood loss or risk of bleeding: yes

## 2014-06-07 NOTE — Discharge Instructions (Signed)

## 2014-06-07 NOTE — Transfer of Care (Signed)
Immediate Anesthesia Transfer of Care Note  Patient: Howard [name]  Procedure(s) Performed: Procedure(s): LUMBAR FOUR TO FIVE LUMBAR LAMINECTOMY/DECOMPRESSION MICRODISCECTOMY 1 LEVEL (N/A)  Patient Location: PACU  Anesthesia Type:General  Level of Consciousness: awake, alert  and oriented  Airway & Oxygen Therapy: Patient Spontanous Breathing and Patient connected to nasal cannula oxygen  Post-op Assessment: Report given to PACU RN and Post -op Vital signs reviewed and stable  Post vital signs: Reviewed and stable  Complications: No apparent anesthesia complications

## 2014-06-07 NOTE — Progress Notes (Signed)
Patient verbalized understanding with Discharge instructions. Wheeled by Solmon Ice NT to main entrance for discharge.

## 2014-06-07 NOTE — Discharge Summary (Signed)
  Physician Discharge Summary  Patient ID: IRBIN FINES MRN: [id number] DOB/AGE: 08/25/1931 80 y.o.  Admit date: 06/07/2014 Discharge date: 06/07/2014  Admission Diagnoses:  Discharge Diagnoses:  Principal Problem:   Spinal stenosis, lumbar region, with neurogenic claudication Active Problems:   Lumbar stenosis with neurogenic claudication   Discharged Condition: good  Hospital Course: Patient admitted the hospital oriented when uncomplicated lumbar decompressive laminectomy. Postoperatively he is doing very well. Back and leg pain very much improved. Able to stand and light without difficulty. Voiding well. Her to for discharge home.  Consults:   Significant Diagnostic Studies:   Treatments:   Discharge Exam: Blood pressure 112/67, pulse 87, temperature 98.1 F (36.7 C), temperature source Oral, resp. rate 18, height 5\' 9"  (1.753 m), weight 86.183 kg (190 lb), SpO2 94.00%. Awake and alert. Oriented and appropriate. Motor and sensory function intact. Wound clean and dry. Chest and abdomen benign.  Disposition: 01-Home or Self Care     Medication List         amLODipine 5 MG tablet  Commonly known as:  NORVASC  Take 1 tablet (5 mg total) by mouth daily.     aspirin 81 MG tablet  Take 81 mg by mouth daily.     buPROPion 300 MG 24 hr tablet  Commonly known as:  WELLBUTRIN XL  Take 300 mg by mouth daily.     cyclobenzaprine 10 MG tablet  Commonly known as:  FLEXERIL  Take 1 tablet (10 mg total) by mouth 3 (three) times daily as needed for muscle spasms.     hydrochlorothiazide 25 MG tablet  Commonly known as:  HYDRODIURIL  Take 1 tablet (25 mg total) by mouth daily.     HYDROcodone-acetaminophen 5-325 MG per tablet  Commonly known as:  NORCO/VICODIN  Take 1-2 tablets by mouth every 4 (four) hours as needed for moderate pain.     metFORMIN 500 MG tablet  Commonly known as:  GLUCOPHAGE  Take 500 mg by mouth daily with lunch.     metoprolol succinate 25 MG 24  hr tablet  Commonly known as:  TOPROL-XL  Take 25 mg by mouth every evening.     multivitamin capsule  Take 1 capsule by mouth daily.     omeprazole 40 MG capsule  Commonly known as:  PRILOSEC  Take 40 mg by mouth 2 (two) times daily.     simvastatin 20 MG tablet  Commonly known as:  ZOCOR  Take 20 mg by mouth at bedtime.     Vitamin D3 1000 UNITS Caps  Take 1,000 Units by mouth daily.     WELCHOL 3.75 G Pack  Generic drug:  Colesevelam HCl  Take 3.75 g by mouth daily. Mix in [place] of water and drink.         Signed: Wynema Garoutte A 06/07/2014, 5:39 PM

## 2014-06-07 NOTE — Op Note (Signed)
Date of procedure: 06/07/2014  Date of dictation: Same  Service: Neurosurgery  Preoperative diagnosis: Recurrent L4-5 stenosis  Postoperative diagnosis: Same  Procedure Name: Reexploration of right L4-5 laminotomy with bilateral complete L4-L5 decompressive laminectomy and bilateral L4 and L5 decompressive foraminotomies  Surgeon:Henry A.Pool, M.D.  Asst. Surgeon: Vertell Limber  Anesthesia: General  Indication: 80 year old male status post previous decompressive surgery by another surgeon approximately 4 years ago presents with severe back and bilateral lower extremity symptoms her workup demonstrates evidence of critical recurrent stenosis at L4-5. Patient presents now for decompressive laminectomy in hopes of improving his symptoms.  Operative note: After induction of anesthesia, patient positioned  Prone onto a Wilson frame and appropriately padded. Incision made overlying L4-5. Dissection performed bilaterally. Retractor placed. X-ray taken. Level of confirmed. Decompressive laminectomies then performed by first removing the spinous processes of L4 and L5. Previous laminotomy at L4-5 was dissected free. Complete laminectomy of L4 was then performed using high-speed drill, Kerrison rongeurs,. Medial facetectomies of L4 and L5 were performed bilaterally. Superior two thirds of the lamina of L5 were resected. Ligament flavum and epidural scar were elevated and resected. Lateral gutters were undercut. Foraminotomies performed on course exiting L4 and L5 nerve roots bilaterally. This point a very thorough decompression been achieved. There was no evidence of injury to thecal sac or nerve roots. Wounds that area family solution. Gelfoam was placed topically for hemostasis. Retractor removed. Wounds and close in layers with Vicryl sutures. Steri-Strips and sterile dressing were applied. No apparent complications. Patient tolerated the procedure well and he returns to the recovery room postop.

## 2014-06-07 NOTE — Anesthesia Preprocedure Evaluation (Signed)
Anesthesia Evaluation  Patient identified by MRN, date of birth, ID band Patient awake    Airway Mallampati: II      Dental   Pulmonary sleep apnea , former smoker,  breath sounds clear to auscultation        Cardiovascular hypertension, + dysrhythmias Atrial Fibrillation + pacemaker Rhythm:Regular Rate:Normal     Neuro/Psych    GI/Hepatic Neg liver ROS, GERD-  ,  Endo/Other  diabetes  Renal/GU negative Renal ROS     Musculoskeletal   Abdominal   Peds  Hematology   Anesthesia Other Findings   Reproductive/Obstetrics                           Anesthesia Physical Anesthesia Plan  ASA: III  Anesthesia Plan: General   Post-op Pain Management:    Induction: Intravenous  Airway Management Planned: Oral ETT  Additional Equipment:   Intra-op Plan:   Post-operative Plan: Extubation in OR  Informed Consent: I have reviewed the patients History and Physical, chart, labs and discussed the procedure including the risks, benefits and alternatives for the proposed anesthesia with the patient or authorized representative who has indicated his/her understanding and acceptance.   Dental advisory given  Plan Discussed with: CRNA and Anesthesiologist  Anesthesia Plan Comments:         Anesthesia Quick Evaluation

## 2014-06-07 NOTE — Anesthesia Postprocedure Evaluation (Signed)
  Anesthesia Post-op Note  Patient: Howard [name]  Procedure(s) Performed: Procedure(s): LUMBAR FOUR TO FIVE LUMBAR LAMINECTOMY/DECOMPRESSION MICRODISCECTOMY 1 LEVEL (N/A)  Patient Location: PACU  Anesthesia Type:General  Level of Consciousness: awake  Airway and Oxygen Therapy: Patient Spontanous Breathing  Post-op Pain: mild  Post-op Assessment: Post-op Vital signs reviewed  Post-op Vital Signs: Reviewed  Last Vitals:  Filed Vitals:   06/07/14 1236  BP: 143/63  Pulse: 76  Temp:   Resp: 18    Complications: No apparent anesthesia complications

## 2014-06-07 NOTE — H&P (Signed)
Howard [name] is an 80 y.o. male.   Chief Complaint: Back and bilateral leg pain HPI: 80 year old male status post previous right-sided lumbar decompressive surgery presents with recurrent and worsening back and bilateral lower extremity pain. Workup demonstrates evidence of residual stenosis at L4-5 with severe compression the thecal sac and nerve roots. Patient presents now for a decompressive laminectomy. Past Medical History  Diagnosis Date  . Hyperlipidemia   . Hypertension   . Pacemaker 11/30/10    PPM- St Jude implanted by [employer]  . Complete heart block   . Complication of anesthesia     post op confusion  . Vestibular dizziness involving both inner ears   . Sleep apnea     cpap  . Diabetes mellitus without complication     fasting 130s  . Depression   . GERD (gastroesophageal reflux disease)   . Arthritis   . Cancer     prostate and skin    Past Surgical History  Procedure Laterality Date  . Ruptured disc      1962 and 1998  . Cervical fusion    . Knee surgery      left knee 1991 and 1992; right knee 1995  . Prostate surgery      cancer--1998  . Replacement total knee      2004  . Hand surgery      right 1993; left 2005  . Back surgery      2011  . Pacemaker insertion      PPM-- St Jude 11/30/10 by [name]  . Catheter ablation  1991    for WPW  . Joint replacement Left 2013    knee  . Joint replacement Right 2004    knee    History reviewed. No pertinent family history. Social History:  reports that he has quit smoking. He does not have any smokeless tobacco history on file. He reports that he drinks alcohol. He reports that he does not use illicit drugs.  Allergies:  Allergies  Allergen Reactions  . Sulfonamide Derivatives Rash    Medications Prior to Admission  Medication Sig Dispense Refill  . amLODipine (NORVASC) 5 MG tablet Take 1 tablet (5 mg total) by mouth daily.  90 tablet  3  . aspirin 81 MG tablet Take 81 mg by mouth daily.        Marland Kitchen buPROPion  (WELLBUTRIN XL) 300 MG 24 hr tablet Take 300 mg by mouth daily.      . Cholecalciferol (VITAMIN D3) 1000 UNITS CAPS Take 1,000 Units by mouth daily.       . Colesevelam HCl (WELCHOL) 3.75 G PACK Take 3.75 g by mouth daily. Mix in [place] of water and drink.      . hydrochlorothiazide 25 MG tablet Take 1 tablet (25 mg total) by mouth daily.  90 tablet  3  . metFORMIN (GLUCOPHAGE) 500 MG tablet Take 500 mg by mouth daily with lunch.       . metoprolol succinate (TOPROL-XL) 25 MG 24 hr tablet Take 25 mg by mouth every evening.      . Multiple Vitamin (MULTIVITAMIN) capsule Take 1 capsule by mouth daily.        Marland Kitchen omeprazole (PRILOSEC) 40 MG capsule Take 40 mg by mouth 2 (two) times daily.      . simvastatin (ZOCOR) 20 MG tablet Take 20 mg by mouth at bedtime.          Results for orders placed during the hospital encounter of 06/07/14 (  from the past 48 hour(s))  GLUCOSE, CAPILLARY     Status: Abnormal   Collection Time    06/07/14  8:07 AM      Result Value Ref Range   Glucose-Capillary 140 (*) 70 - 99 mg/dL   Dg Chest 2 View  06/06/2014   CLINICAL DATA:  Preop back surgery  EXAM: CHEST  2 VIEW  COMPARISON:  None.  FINDINGS: Dual lead pacemaker with leads in the right atrium and right ventricle in good position. Heart size is normal. Negative for heart failure.  Increased streaky lung markings the bases most consistent with fibrosis. No prior studies for comparison.  Sub cm nodule right lung base may represent a nipple shadow. Repeat with nipple markers recommended.  IMPRESSION: Possible nipple shadow right lung base, recommend repeat chest x-ray with nipple markers to rule out lung nodule  Bibasilar pulmonary scarring.  These results will be called to the ordering clinician or representative by the Radiologist Assistant, and communication documented in the PACS or zVision Dashboard.   Electronically Signed   By: Franchot Gallo M.D.   On: 06/06/2014 15:17    Review of Systems  Constitutional:  Negative.   HENT: Negative.   Eyes: Negative.   Respiratory: Negative.   Cardiovascular: Negative.   Gastrointestinal: Negative.   Genitourinary: Negative.   Musculoskeletal: Negative.   Skin: Negative.   Neurological: Negative.   Endo/Heme/Allergies: Negative.   Psychiatric/Behavioral: Negative.     Blood pressure 171/70, pulse 71, temperature 97.8 F (36.6 C), temperature source Oral, height 5\' 9"  (1.753 m), weight 86.183 kg (190 lb), SpO2 97.00%. Physical Exam  Constitutional: He is oriented to person, place, and time. He appears well-developed and well-nourished.  HENT:  Head: Normocephalic and atraumatic.  Right Ear: External ear normal.  Left Ear: External ear normal.  Nose: Nose normal.  Mouth/Throat: No oropharyngeal exudate.  Eyes: Conjunctivae and EOM are normal. Pupils are equal, round, and reactive to light. Right eye exhibits no discharge. Left eye exhibits no discharge.  Neck: Normal range of motion. Neck supple. No tracheal deviation present. No thyromegaly present.  Cardiovascular: Regular rhythm, normal heart sounds and intact distal pulses.  Exam reveals no friction rub.   No murmur heard. Respiratory: Effort normal and breath sounds normal. No respiratory distress. He has no wheezes.  GI: Soft. Bowel sounds are normal. He exhibits no distension. There is no tenderness.  Neurological: He is alert and oriented to person, place, and time. He has normal reflexes. No cranial nerve deficit. Coordination normal.  Skin: Skin is warm and dry. No rash noted. He is not diaphoretic. No erythema.  Psychiatric: He has a normal mood and affect. His behavior is normal. Judgment and thought content normal.     Assessment/Plan L4-5 stenosis with neurogenic claudication. Plan L4-5 redo decompressive laminectomy and foraminotomies. Risks and benefits been explained. Patient wishes to proceed.  POOL,HENRY A 06/07/2014, 9:14 AM

## 2014-06-12 ENCOUNTER — Encounter (HOSPITAL_COMMUNITY): Payer: Self-pay | Admitting: Neurosurgery

## 2014-06-27 DIAGNOSIS — I10 Essential (primary) hypertension: Secondary | ICD-10-CM | POA: Diagnosis not present

## 2014-06-27 DIAGNOSIS — E785 Hyperlipidemia, unspecified: Secondary | ICD-10-CM | POA: Diagnosis not present

## 2014-06-27 DIAGNOSIS — E119 Type 2 diabetes mellitus without complications: Secondary | ICD-10-CM | POA: Diagnosis not present

## 2014-06-30 DIAGNOSIS — L820 Inflamed seborrheic keratosis: Secondary | ICD-10-CM | POA: Diagnosis not present

## 2014-06-30 DIAGNOSIS — R209 Unspecified disturbances of skin sensation: Secondary | ICD-10-CM | POA: Diagnosis not present

## 2014-06-30 DIAGNOSIS — L723 Sebaceous cyst: Secondary | ICD-10-CM | POA: Diagnosis not present

## 2014-06-30 DIAGNOSIS — L538 Other specified erythematous conditions: Secondary | ICD-10-CM | POA: Diagnosis not present

## 2014-07-01 DIAGNOSIS — F3289 Other specified depressive episodes: Secondary | ICD-10-CM | POA: Diagnosis not present

## 2014-07-01 DIAGNOSIS — E785 Hyperlipidemia, unspecified: Secondary | ICD-10-CM | POA: Diagnosis not present

## 2014-07-01 DIAGNOSIS — Z1342 Encounter for screening for global developmental delays (milestones): Secondary | ICD-10-CM | POA: Diagnosis not present

## 2014-07-01 DIAGNOSIS — F329 Major depressive disorder, single episode, unspecified: Secondary | ICD-10-CM | POA: Diagnosis not present

## 2014-07-01 DIAGNOSIS — E119 Type 2 diabetes mellitus without complications: Secondary | ICD-10-CM | POA: Diagnosis not present

## 2014-07-01 DIAGNOSIS — Z1331 Encounter for screening for depression: Secondary | ICD-10-CM | POA: Diagnosis not present

## 2014-07-01 DIAGNOSIS — Z23 Encounter for immunization: Secondary | ICD-10-CM | POA: Diagnosis not present

## 2014-07-01 DIAGNOSIS — Z1330 Encounter for screening examination for mental health and behavioral disorders, unspecified: Secondary | ICD-10-CM | POA: Diagnosis not present

## 2014-07-01 DIAGNOSIS — I10 Essential (primary) hypertension: Secondary | ICD-10-CM | POA: Diagnosis not present

## 2014-07-01 DIAGNOSIS — G4733 Obstructive sleep apnea (adult) (pediatric): Secondary | ICD-10-CM | POA: Diagnosis not present

## 2014-07-12 ENCOUNTER — Ambulatory Visit (INDEPENDENT_AMBULATORY_CARE_PROVIDER_SITE_OTHER): Payer: Medicare Other | Admitting: *Deleted

## 2014-07-12 DIAGNOSIS — I442 Atrioventricular block, complete: Secondary | ICD-10-CM | POA: Diagnosis not present

## 2014-07-12 LAB — MDC_IDC_ENUM_SESS_TYPE_REMOTE
Battery Remaining Percentage: 65 %
Battery Voltage: 2.93 V
Brady Statistic AP VP Percent: 3.9 %
Brady Statistic AP VS Percent: 1 %
Brady Statistic RA Percent Paced: 3.8 %
Brady Statistic RV Percent Paced: 99 %
Date Time Interrogation Session: 20150721065947
Implantable Pulse Generator Serial Number: 7196739
Lead Channel Impedance Value: 430 Ohm
Lead Channel Sensing Intrinsic Amplitude: 12 mV
Lead Channel Setting Pacing Amplitude: 2 V
Lead Channel Setting Pacing Amplitude: 2.5 V
Lead Channel Setting Pacing Pulse Width: 0.8 ms
MDC IDC MSMT BATTERY REMAINING LONGEVITY: 59 mo
MDC IDC MSMT LEADCHNL RA SENSING INTR AMPL: 3.7 mV
MDC IDC MSMT LEADCHNL RV IMPEDANCE VALUE: 350 Ohm
MDC IDC SET LEADCHNL RV SENSING SENSITIVITY: 4 mV
MDC IDC STAT BRADY AS VP PERCENT: 96 %
MDC IDC STAT BRADY AS VS PERCENT: 1 %

## 2014-07-12 NOTE — Progress Notes (Signed)
Remote pacemaker transmission.   

## 2014-07-14 DIAGNOSIS — H8110 Benign paroxysmal vertigo, unspecified ear: Secondary | ICD-10-CM | POA: Diagnosis not present

## 2014-07-25 DIAGNOSIS — R42 Dizziness and giddiness: Secondary | ICD-10-CM | POA: Diagnosis not present

## 2014-07-27 DIAGNOSIS — C44319 Basal cell carcinoma of skin of other parts of face: Secondary | ICD-10-CM | POA: Diagnosis not present

## 2014-07-29 ENCOUNTER — Telehealth: Payer: Self-pay | Admitting: Internal Medicine

## 2014-07-29 ENCOUNTER — Emergency Department: Payer: Self-pay | Admitting: Emergency Medicine

## 2014-07-29 DIAGNOSIS — R0602 Shortness of breath: Secondary | ICD-10-CM | POA: Diagnosis not present

## 2014-07-29 DIAGNOSIS — Z950 Presence of cardiac pacemaker: Secondary | ICD-10-CM | POA: Diagnosis not present

## 2014-07-29 DIAGNOSIS — E119 Type 2 diabetes mellitus without complications: Secondary | ICD-10-CM | POA: Diagnosis not present

## 2014-07-29 DIAGNOSIS — R911 Solitary pulmonary nodule: Secondary | ICD-10-CM | POA: Diagnosis not present

## 2014-07-29 DIAGNOSIS — I10 Essential (primary) hypertension: Secondary | ICD-10-CM | POA: Diagnosis not present

## 2014-07-29 DIAGNOSIS — R079 Chest pain, unspecified: Secondary | ICD-10-CM | POA: Diagnosis not present

## 2014-07-29 DIAGNOSIS — J984 Other disorders of lung: Secondary | ICD-10-CM | POA: Diagnosis not present

## 2014-07-29 DIAGNOSIS — R0789 Other chest pain: Secondary | ICD-10-CM | POA: Diagnosis not present

## 2014-07-29 LAB — BASIC METABOLIC PANEL
ANION GAP: 10 (ref 7–16)
BUN: 11 mg/dL (ref 7–18)
CO2: 25 mmol/L (ref 21–32)
CREATININE: 0.9 mg/dL (ref 0.60–1.30)
Calcium, Total: 9 mg/dL (ref 8.5–10.1)
Chloride: 100 mmol/L (ref 98–107)
EGFR (African American): >60
EGFR (Non-African Amer.): >60
GLUCOSE: 152 mg/dL — AB (ref 65–99)
Osmolality: 272 (ref 275–301)
Potassium: 3.8 mmol/L (ref 3.5–5.1)
Sodium: 135 mmol/L — ABNORMAL LOW (ref 136–145)

## 2014-07-29 LAB — CBC
HCT: 44.3 % (ref 40.0–52.0)
HGB: 14.3 g/dL (ref 13.0–18.0)
MCH: 26.6 pg (ref 26.0–34.0)
MCHC: 32.3 g/dL (ref 32.0–36.0)
MCV: 82 fL (ref 80–100)
Platelet: 203 10*3/uL (ref 150–440)
RBC: 5.39 10*6/uL (ref 4.40–5.90)
RDW: 14.9 % — ABNORMAL HIGH (ref 11.5–14.5)
WBC: 5.9 10*3/uL (ref 3.8–10.6)

## 2014-07-29 LAB — TROPONIN I
Troponin-I: <0.02 ng/mL
Troponin-I: <0.02 ng/mL

## 2014-08-03 ENCOUNTER — Encounter: Payer: Self-pay | Admitting: Physician Assistant

## 2014-08-03 ENCOUNTER — Ambulatory Visit (INDEPENDENT_AMBULATORY_CARE_PROVIDER_SITE_OTHER): Payer: Medicare Other | Admitting: Physician Assistant

## 2014-08-03 VITALS — BP 142/70 | HR 68 | Ht 69.0 in | Wt 190.4 lb

## 2014-08-03 DIAGNOSIS — E119 Type 2 diabetes mellitus without complications: Secondary | ICD-10-CM | POA: Diagnosis not present

## 2014-08-03 DIAGNOSIS — I442 Atrioventricular block, complete: Secondary | ICD-10-CM | POA: Diagnosis not present

## 2014-08-03 DIAGNOSIS — E785 Hyperlipidemia, unspecified: Secondary | ICD-10-CM

## 2014-08-03 DIAGNOSIS — R072 Precordial pain: Secondary | ICD-10-CM | POA: Diagnosis not present

## 2014-08-03 DIAGNOSIS — I10 Essential (primary) hypertension: Secondary | ICD-10-CM | POA: Diagnosis not present

## 2014-08-03 DIAGNOSIS — G4730 Sleep apnea, unspecified: Secondary | ICD-10-CM | POA: Insufficient documentation

## 2014-08-03 NOTE — Patient Instructions (Signed)
Your physician recommends that you continue on your current medications as directed. Please refer to the Current Medication list given to you today.  Your physician has requested that you have an echocardiogram. Echocardiography is a painless test that uses sound waves to create images of your heart. It provides your doctor with information about the size and shape of your heart and how well your heart's chambers and valves are working. This procedure takes approximately one hour. There are no restrictions for this procedure.   Your physician has requested that you have a lexiscan myoview. For further information please visit [email or web address]. Please follow instruction sheet, as given.  Your physician recommends that you schedule a follow-up appointment in: 4 weeks with Dr.Allred

## 2014-08-03 NOTE — Progress Notes (Signed)
[facility] (logo was not loading today)   [place], [address], [place]  [address] Phone: [phone] Fax:  [phone]  Date:  08/03/2014   Patient ID:  Howard [name], DOB Dec 31, 1930, MRN [id number]   PCP:  Margo Common, PA  Cardiologist/EP: Dr. Rayann Heman   History of Present Illness: Howard [name] is a 80 y.o. male with history of HTN, HL, CHB s/p St. Jude pacemaker 2011, OSA, DM who presents for hospital followup. He underwent lumbar decompressive laminectomy in 05/2014. Last device interrogation 7/21 showed "Pacemaker remote check. Device function reviewed. Impedance, sensing, auto capture thresholds consistent with previous measurements. Histograms appropriate for patient and level of activity. All other diagnostic data reviewed and is appropriate and stable for patient. Real time/magnet EGM shows appropriate sensing and capture. 11 mode switches--longest was 6 seconds. No ventricular high rate episodes. Estimated longevity 4.9 years. Merlin 10-18-14 and ROV in December with JA." Remote Echo 11/2010: limited study. EF 50%, focal WMA cannot be excluded, doppler not performed.  He was recently seen in the [facility] ER for chest pain. The night before he had stayed up late to watch the Republican Debate and was snacking quite a bit. The following morning at rest at 5am he developed substernal tightness that went through to his back. No associated SOB, nausea, vomiting, diaphoresis, palpitations. He went to the ER around 7. Nothing made chest pain worse. Chest pain lasted until around 9am. It finally relieved after he was able to burp. Troponins negative at 8:04am and 11:18am. CT chest with contrast showed multiple calcified lung nodules, advised to followup with PCP in 3 months for recheck. He denies any exertional symptoms including exertional chest discomfort, dyspnea. No inspirational pain, LEE, lower extremity erythema. Not tachycardic, tachypnic or hypoxic.  No syncope. He went to exercise class 2 days ago without any problem.  Recent Labs: 06/06/2014: Creatinine 0.91  06/07/2014: Hemoglobin 14.3; Potassium 3.9   Wt Readings from Last 3 Encounters:  08/03/14 190 lb 6.4 oz (86.365 kg)  06/07/14 190 lb (86.183 kg)  06/07/14 190 lb (86.183 kg)     Past Medical History  Diagnosis Date  . Hyperlipidemia   . Hypertension   . Pacemaker 11/30/10    PPM- St Jude implanted by [employer]  . Complete heart block   . Vestibular dizziness involving both inner ears   . Sleep apnea     cpap  . Diabetes mellitus without complication   . Depression   . GERD (gastroesophageal reflux disease)   . Arthritis   . Cancer     prostate and skin    Current Outpatient Prescriptions  Medication Sig Dispense Refill  . amLODipine (NORVASC) 5 MG tablet Take 1 tablet (5 mg total) by mouth daily.  90 tablet  3  . aspirin 81 MG tablet Take 81 mg by mouth daily.        Marland Kitchen buPROPion (WELLBUTRIN XL) 300 MG 24 hr tablet Take 300 mg by mouth daily.      . Cholecalciferol (VITAMIN D3) 1000 UNITS CAPS Take 1,000 Units by mouth daily.       . Colesevelam HCl (WELCHOL) 3.75 G PACK Take 3.75 g by mouth daily. Mix in [place] of water and drink.      . hydrochlorothiazide 25 MG tablet Take 1 tablet (25 mg total) by mouth daily.  90 tablet  3  . metFORMIN (GLUCOPHAGE) 500 MG tablet Take 500 mg by mouth daily with  lunch.       . metoprolol succinate (TOPROL-XL) 25 MG 24 hr tablet Take 25 mg by mouth every evening.      . Multiple Vitamin (MULTIVITAMIN) capsule Take 1 capsule by mouth daily.        Marland Kitchen omeprazole (PRILOSEC) 40 MG capsule Take 40 mg by mouth 2 (two) times daily.      . simvastatin (ZOCOR) 20 MG tablet Take 20 mg by mouth at bedtime.         No current facility-administered medications for this visit.    Allergies:   Sulfonamide derivatives   Social History:  The patient  reports that he has quit smoking. He does not have any smokeless tobacco history on file. He reports  that he drinks alcohol. He reports that he does not use illicit drugs.   Family History:  The patient's family history includes CAD in an other family member.   ROS:  Please see the history of present illness.    All other systems reviewed and negative.   PHYSICAL EXAM:  VS:  BP 142/70  Pulse 68  Ht 5\' 9"  (1.753 m)  Wt 190 lb 6.4 oz (86.365 kg)  BMI 28.10 kg/m2  SpO2 93% Well nourished, well developed WM, in no acute distress HEENT: normal Neck: no JVD Cardiac:  normal S1, S2; RRR; no murmur Lungs:  clear to auscultation bilaterally, no wheezing, rhonchi or rales Abd: soft, nontender, no hepatomegaly Ext: no edema Skin: warm and dry Neuro:  moves all extremities spontaneously, no focal abnormalities noted  EKG:  Atrial sensed V paced 68bpm     ASSESSMENT AND PLAN:  1. Chest pain, atypical and typical features 2. CHB s/p St. Jude pacemaker 2011 3. HTN 4. Hyperlipidemia 5. Diabetes mellitus, followed by primary care 6. Hyperkalemia by labs 05/2014, normal at 3.8 at [facility]  Given cardiac risk factors, will plan Lexiscan nuclear stress test and 2D echo to further evaluate chest pain. If these are negative, suspect GI etiology. He is already on a good heart regimen. Lipids are followed by PCP. Will also ask patient to send in transmission to exclude any kind of rhythm abnormalities prompting this episode. ER precautions reviewed.  Dispo: F/u 4-6 weeks with Dr. Rayann Heman.  Signed, Howard Copa, PA-C  08/03/2014 10:39 AM

## 2014-08-04 ENCOUNTER — Encounter: Payer: Self-pay | Admitting: Cardiology

## 2014-08-11 ENCOUNTER — Ambulatory Visit (HOSPITAL_COMMUNITY): Payer: Medicare Other | Attending: Internal Medicine | Admitting: Radiology

## 2014-08-11 ENCOUNTER — Ambulatory Visit (HOSPITAL_BASED_OUTPATIENT_CLINIC_OR_DEPARTMENT_OTHER): Payer: Medicare Other | Admitting: Radiology

## 2014-08-11 VITALS — BP 148/75 | HR 65 | Ht 69.0 in | Wt 188.0 lb

## 2014-08-11 DIAGNOSIS — R072 Precordial pain: Secondary | ICD-10-CM | POA: Diagnosis not present

## 2014-08-11 DIAGNOSIS — I442 Atrioventricular block, complete: Secondary | ICD-10-CM

## 2014-08-11 DIAGNOSIS — I519 Heart disease, unspecified: Secondary | ICD-10-CM | POA: Diagnosis not present

## 2014-08-11 DIAGNOSIS — R079 Chest pain, unspecified: Secondary | ICD-10-CM | POA: Diagnosis not present

## 2014-08-11 MED ORDER — ADENOSINE (DIAGNOSTIC) 3 MG/ML IV SOLN
0.5600 mg/kg | Freq: Once | INTRAVENOUS | Status: AC
  Administered 2014-08-11: 47.7 mg via INTRAVENOUS

## 2014-08-11 MED ORDER — TECHNETIUM TC 99M SESTAMIBI GENERIC - CARDIOLITE
10.0000 | Freq: Once | INTRAVENOUS | Status: AC | PRN
  Administered 2014-08-11: 10 via INTRAVENOUS

## 2014-08-11 MED ORDER — TECHNETIUM TC 99M SESTAMIBI GENERIC - CARDIOLITE
30.0000 | Freq: Once | INTRAVENOUS | Status: AC | PRN
  Administered 2014-08-11: 30 via INTRAVENOUS

## 2014-08-11 NOTE — Progress Notes (Signed)
[facility] SITE 3 NUCLEAR MED [address] [place], [place] [address] [phone]    Cardiology Nuclear Med Study  Howard [name] is a 80 y.o. male     MRN : [id number]     DOB: [birth year]  Procedure Date: 08/11/2014  Nuclear Med Background Indication for Stress Test:  Evaluation for Ischemia, and Patient seen in hospital on 07-2014 at [facility] for Chest Pain,  Enzymes negative History:  No known CAD, Cath, PTVP, WPW (ablation), Echo 2011 EF 50% Cardiac Risk Factors: Family History - CAD, History of Smoking, Hypertension, Lipids and NIDDM  Symptoms:  Chest Pain (last date of chest discomfort was three weeks ago) and Dizziness   Nuclear Pre-Procedure Caffeine/Decaff Intake:  None> 12 hrs NPO After: 6:00am   Lungs:  clear O2 Sat: 92% on room air. IV 0.9% NS with Angio Cath:  22g  IV Site: R Forearm x 1, tolerated well IV Started by:  Irven Baltimore, RN  Chest Size (in):  42 Cup Size: n/a  Height: 5\' 9"  (1.753 m)  Weight:  188 lb (85.276 kg)  BMI:  Body mass index is 27.75 kg/(m^2). Tech Comments:  Patient took Norvasc, but held Metformin this am. Last dose of Toprol was last night. Irven Baltimore, RN.    Nuclear Med Study 1 or 2 day study: 1 day  Stress Test Type:  Adenosine  Reading MD: N/A  Order Authorizing Provider:  [name], MD, and Melina Copa, PAC  Resting Radionuclide: Technetium 109m Sestamibi  Resting Radionuclide Dose: 11.0 mCi   Stress Radionuclide:  Technetium 9m Sestamibi  Stress Radionuclide Dose: 33.0 mCi           Stress Protocol Rest HR: 65 Stress HR: 81  Rest BP: 148/75 Stress BP: 153/68  Exercise Time (min): n/a METS: n/a           Dose of Adenosine (mg):  47.9 mg Dose of Lexiscan: n/a mg  Dose of Atropine (mg): n/a Dose of Dobutamine: n/a mcg/kg/min (at max HR)  Stress Test Technologist: Glade Lloyd, BS-ES  Nuclear Technologist:  Vedia Pereyra, CNMT     Rest Procedure:  Myocardial perfusion imaging was performed at  rest 45 minutes following the intravenous administration of Technetium 38m Sestamibi. Rest ECG: SR  Ventricular paced.    Stress Procedure:  The patient received IV adenosine at 140 mcg/kg/min for 4 minutes.  Technetium 97m Sestamibi was injected at the 2 minute mark and quantitative spect images were obtained after a 45 minute delay.  During the infusion of Adenosine the patient complained of feeling flushed and head pressure.  These symptoms completely resolved in recovery.  Stress NOB:[id number] due to baseline changes  QPS Raw Data Images:  Soft tissue (diaphragm, bowel activity) underlies heart.   Stress Images:   Decreased tracer activity in the inferior wall (base, mid, minimally distal), inferoseptal wall (base, mid, minimally distal), inferolateral wall (base) and apex.   Rest Images:  No significant change from the stress images.   Subtraction (SDS):  No significant ischemia.   Transient Ischemic Dilatation (Normal <1.22):  1.07 Lung/Heart Ratio (Normal <0.45):  0.29  Quantitative Gated Spect Images QGS EDV:  167 ml QGS ESV:  114 ml  Impression Exercise Capacity:  Adenosine study with no exercise. BP Response:  Normal blood pressure response. Clinical Symptoms:  No chest pain. ECG Impression:  Nondiagnostic due to baseline changes.   Comparison with Prior Nuclear Study: No prior study  Overall Impression: Scar and possible  soft tissue attenuation inf the inferior/inferoseptal/basal inferolateral and apical walls  No significant ischemia.    LV Ejection Fraction: 32%.  LV Wall Motion: diffuse hypokinesis and apical dyskinesis.    [name]

## 2014-08-11 NOTE — Progress Notes (Signed)
Echocardiogram performed.  

## 2014-08-15 ENCOUNTER — Encounter: Payer: Self-pay | Admitting: Physician Assistant

## 2014-08-24 ENCOUNTER — Other Ambulatory Visit: Payer: Federal, State, Local not specified - PPO

## 2014-08-24 ENCOUNTER — Encounter: Payer: Self-pay | Admitting: Cardiology

## 2014-08-24 ENCOUNTER — Ambulatory Visit (INDEPENDENT_AMBULATORY_CARE_PROVIDER_SITE_OTHER): Payer: Medicare Other | Admitting: Cardiology

## 2014-08-24 VITALS — BP 136/70 | HR 72 | Ht 69.0 in | Wt 191.0 lb

## 2014-08-24 DIAGNOSIS — R9430 Abnormal result of cardiovascular function study, unspecified: Secondary | ICD-10-CM

## 2014-08-24 DIAGNOSIS — I5041 Acute combined systolic (congestive) and diastolic (congestive) heart failure: Secondary | ICD-10-CM

## 2014-08-24 DIAGNOSIS — R0789 Other chest pain: Secondary | ICD-10-CM | POA: Diagnosis not present

## 2014-08-24 DIAGNOSIS — R0989 Other specified symptoms and signs involving the circulatory and respiratory systems: Secondary | ICD-10-CM | POA: Diagnosis not present

## 2014-08-24 LAB — BASIC METABOLIC PANEL
BUN: 16 mg/dL (ref 6–23)
CALCIUM: 9 mg/dL (ref 8.4–10.5)
CO2: 23 mEq/L (ref 19–32)
Chloride: 101 mEq/L (ref 96–112)
Creatinine, Ser: 1 mg/dL (ref 0.4–1.5)
GFR: 80.8 mL/min (ref >60.00)
Glucose, Bld: 208 mg/dL — ABNORMAL HIGH (ref 70–99)
POTASSIUM: 3.8 meq/L (ref 3.5–5.1)
SODIUM: 134 meq/L — AB (ref 135–145)

## 2014-08-24 LAB — PROTIME-INR
INR: 1 ratio (ref 0.8–1.0)
PROTHROMBIN TIME: 11.1 s (ref 9.6–13.1)

## 2014-08-24 LAB — CBC WITH DIFFERENTIAL/PLATELET
BASOS PCT: 0.5 % (ref 0.0–3.0)
Basophils Absolute: 0 10*3/uL (ref 0.0–0.1)
Eosinophils Absolute: 0.5 10*3/uL (ref 0.0–0.7)
Eosinophils Relative: 7.7 % — ABNORMAL HIGH (ref 0.0–5.0)
HCT: 43.6 % (ref 39.0–52.0)
HEMOGLOBIN: 14.5 g/dL (ref 13.0–17.0)
LYMPHS ABS: 1.8 10*3/uL (ref 0.7–4.0)
Lymphocytes Relative: 27.4 % (ref 12.0–46.0)
MCHC: 33.2 g/dL (ref 30.0–36.0)
MCV: 80.6 fl (ref 78.0–100.0)
MONOS PCT: 10.1 % (ref 3.0–12.0)
Monocytes Absolute: 0.7 10*3/uL (ref 0.1–1.0)
NEUTROS ABS: 3.5 10*3/uL (ref 1.4–7.7)
Neutrophils Relative %: 54.3 % (ref 43.0–77.0)
Platelets: 242 10*3/uL (ref 150.0–400.0)
RBC: 5.41 Mil/uL (ref 4.22–5.81)
RDW: 14.8 % (ref 11.5–15.5)
WBC: 6.5 10*3/uL (ref 4.0–10.5)

## 2014-08-24 LAB — APTT: APTT: 36.3 s — AB (ref 23.4–32.7)

## 2014-08-24 NOTE — Patient Instructions (Addendum)
  Your physician recommends that you continue on your current medications as directed. Please refer to the Current Medication list given to you today.  Your physician recommends that you return for lab work today: bmet/ptt/pt/inr/cbc   You are scheduled for a cardiac catheterization on Friday, September 4th  with Dr. Neita Garnet or associate.  Go to [facility] Short Stay on Friday at 7:00am your procedure is at 9:00 am.  Enter thru the [facility] entrance A No food or drink after midnight on Thursday. You may take your medications with a sip of water on the day of your procedure.

## 2014-08-24 NOTE — Progress Notes (Signed)
08/24/2014 Howard [name]   [birth year]  [id number]  Primary Physicia Chrismon, DENNIS E, PA Primary Cardiologist: Howard [name]  HPI:  Howard [name] presents to clinic today for followup and to review the results of his recent stress test and echocardiogram. He is an 80 year old male, followed by Howard [name], with a history of hypertension, hyperlipidemia, complete heart block status post St. Jude's permanent pacemaker implanted in 2011, obstructive sleep apnea compliant with CPAP therapy, and diabetes. He was recently seen at [facility] Emergency Department and evaluated for chest pain. He endorsed resting substernal chest discomfort that felt like indigestion, waking him from his sleep. Also described a chest tightness with radiation to his back. No other associated symptoms. Relieved with belching. His workup in the ED included negative troponins and negative CT of the chest. He was not admitted. He was discharged and instructed to followup in our office for further evaluation. He was evaluated by Howard Ku, PA-C. She recommended further evaluation with a nuclear stress test and 2-D echocardiogram.   His 2-D echo is notable for significant decrease in systolic function compared to his prior assessment in 2011. His EF has decreased from 50% down to 35-40%. On his most recent echo, dyskinesis of the mid-apical anteroseptal myocardium was also noted. His stress test was positive for scar and possible soft tissue attenuation in the inferior/inferoseptal/basal inferolateral and apical walls. However, "No significant ischemia". EF was estimated at 32%.   Today in clinic, he is chest pain free and denies any further recurrence in symptoms since his ED visit. His echo and stress test results were also reviewed by Howard [name] in the office today and he has recommended further evaluation with a diagnostic LHC.       Current Outpatient Prescriptions  Medication Sig Dispense Refill  . amLODipine  (NORVASC) 5 MG tablet Take 1 tablet (5 mg total) by mouth daily.  90 tablet  3  . aspirin 81 MG tablet Take 81 mg by mouth daily.        Marland Kitchen buPROPion (WELLBUTRIN XL) 300 MG 24 hr tablet Take 300 mg by mouth daily.      . Cholecalciferol (VITAMIN D3) 1000 UNITS CAPS Take 1,000 Units by mouth daily.       . Colesevelam HCl (WELCHOL) 3.75 G PACK Take 3.75 g by mouth daily. Mix in [place] of water and drink.      . hydrochlorothiazide 25 MG tablet Take 1 tablet (25 mg total) by mouth daily.  90 tablet  3  . metFORMIN (GLUCOPHAGE) 500 MG tablet Take 500 mg by mouth daily with lunch.       . metoprolol succinate (TOPROL-XL) 25 MG 24 hr tablet Take 25 mg by mouth every evening.      . Multiple Vitamin (MULTIVITAMIN) capsule Take 1 capsule by mouth daily.        Marland Kitchen omeprazole (PRILOSEC) 40 MG capsule Take 40 mg by mouth 2 (two) times daily.      . simvastatin (ZOCOR) 20 MG tablet Take 20 mg by mouth at bedtime.         No current facility-administered medications for this visit.    Allergies  Allergen Reactions  . Sulfonamide Derivatives Rash    History   Social History  . Marital Status: Married    Spouse Name: N/A    Number of Children: N/A  . Years of Education: N/A   Occupational History  . Not on file.   Social History Main Topics  .  Smoking status: Former Research scientist (life sciences)  . Smokeless tobacco: Not on file  . Alcohol Use: Yes     Comment: occasional  . Drug Use: No  . Sexual Activity: Not on file   Other Topics Concern  . Not on file   Social History Narrative  . No narrative on file     Review of Systems: General: negative for chills, fever, night sweats or weight changes.  Cardiovascular: negative for chest pain, dyspnea on exertion, edema, orthopnea, palpitations, paroxysmal nocturnal dyspnea or shortness of breath Dermatological: negative for rash Respiratory: negative for cough or wheezing Urologic: negative for hematuria Abdominal: negative for nausea, vomiting, diarrhea,  bright red blood per rectum, melena, or hematemesis Neurologic: negative for visual changes, syncope, or dizziness All other systems reviewed and are otherwise negative except as noted above.    Blood pressure 136/70, pulse 72, height 5\' 9"  (1.753 m), weight 191 lb (86.637 kg).  General appearance: alert, cooperative and no distress Neck: no carotid bruit and no JVD Lungs: clear to auscultation bilaterally Heart: regular rate and rhythm, S1, S2 normal, no murmur, click, rub or gallop Extremities: no LEE Pulses: 2+ and symmetric Skin: warm and dry Neurologic: Grossly normal  EKG Not performed  ASSESSMENT AND PLAN:   1. Reduced Systolic Function: New decrease in EF, down to 35-40%. This is down from 50-60% in 2011. This is also in the setting of recent episode of chest pain, concerning for angina. Although NST was interpreted as "No significant ischemia" it also demonstrated a low EF at 32%. Based on new findings, will plan for a diagnostic LHC for definitive assessment of his coronary anatomy.    PLAN  LHC with Howard [name] at [facility] 08/26/14  [place], [place] 08/24/2014 10:02 AM

## 2014-08-25 ENCOUNTER — Other Ambulatory Visit: Payer: Self-pay | Admitting: *Deleted

## 2014-08-25 ENCOUNTER — Telehealth: Payer: Self-pay | Admitting: *Deleted

## 2014-08-25 ENCOUNTER — Encounter: Payer: Self-pay | Admitting: Internal Medicine

## 2014-08-25 ENCOUNTER — Encounter (HOSPITAL_COMMUNITY): Payer: Self-pay | Admitting: Pharmacy Technician

## 2014-08-25 DIAGNOSIS — Z01818 Encounter for other preprocedural examination: Secondary | ICD-10-CM

## 2014-08-25 NOTE — Telephone Encounter (Signed)
[facility] V/O TO DO  HOSPITAL ORDERS FOR PATIENT SCHEDULE FOR LEFT CARDIAC CATH 08/26/14 WITH DR [place].  ORDERS entered in EPIC.  CALLED AND SPOKE TO PATIENT WIFE-   INSTRUCTION WAS GIVEN TO HOLD METFORMIN AND HCTZ FROM OFFICE VISIT 08/24/14 RN [place] WIFE - JUST [place]

## 2014-08-26 ENCOUNTER — Encounter (HOSPITAL_COMMUNITY)
Admission: RE | Disposition: A | Discharge status: Home / Self Care | Payer: Self-pay | Source: Ambulatory Visit | Attending: Cardiology

## 2014-08-26 ENCOUNTER — Ambulatory Visit (HOSPITAL_COMMUNITY)
Admission: RE | Admit: 2014-08-26 | Discharge: 2014-08-26 | Disposition: A | Discharge status: Home / Self Care | Payer: Medicare Other | Source: Ambulatory Visit | Attending: Cardiology | Admitting: Cardiology

## 2014-08-26 DIAGNOSIS — I442 Atrioventricular block, complete: Secondary | ICD-10-CM | POA: Diagnosis not present

## 2014-08-26 DIAGNOSIS — Z87891 Personal history of nicotine dependence: Secondary | ICD-10-CM | POA: Insufficient documentation

## 2014-08-26 DIAGNOSIS — I2510 Atherosclerotic heart disease of native coronary artery without angina pectoris: Secondary | ICD-10-CM | POA: Diagnosis not present

## 2014-08-26 DIAGNOSIS — Z950 Presence of cardiac pacemaker: Secondary | ICD-10-CM | POA: Diagnosis not present

## 2014-08-26 DIAGNOSIS — I428 Other cardiomyopathies: Secondary | ICD-10-CM | POA: Diagnosis not present

## 2014-08-26 DIAGNOSIS — Z7982 Long term (current) use of aspirin: Secondary | ICD-10-CM | POA: Diagnosis not present

## 2014-08-26 DIAGNOSIS — I10 Essential (primary) hypertension: Secondary | ICD-10-CM | POA: Insufficient documentation

## 2014-08-26 DIAGNOSIS — Z01818 Encounter for other preprocedural examination: Secondary | ICD-10-CM

## 2014-08-26 HISTORY — PX: LEFT HEART CATHETERIZATION WITH CORONARY ANGIOGRAM: SHX5451

## 2014-08-26 HISTORY — PX: CARDIAC CATHETERIZATION: SHX172

## 2014-08-26 LAB — CK TOTAL AND CKMB (NOT AT ARMC)
CK TOTAL: 143 U/L (ref 7–232)
CK, MB: 6 ng/mL — AB (ref 0.3–4.0)
RELATIVE INDEX: 4.2 — AB (ref 0.0–2.5)

## 2014-08-26 LAB — GLUCOSE, CAPILLARY: Glucose-Capillary: 108 mg/dL — ABNORMAL HIGH (ref 70–99)

## 2014-08-26 SURGERY — LEFT HEART CATHETERIZATION WITH CORONARY ANGIOGRAM
Anesthesia: LOCAL

## 2014-08-26 MED ORDER — SODIUM CHLORIDE 0.9 % IV SOLN: 1.0000 mL/kg/h | INTRAVENOUS | Status: DC

## 2014-08-26 MED ORDER — FENTANYL CITRATE 0.05 MG/ML IJ SOLN
INTRAMUSCULAR | Status: AC
  Filled 2014-08-26: qty 2

## 2014-08-26 MED ORDER — MIDAZOLAM HCL 2 MG/2ML IJ SOLN
INTRAMUSCULAR | Status: AC
  Filled 2014-08-26: qty 2

## 2014-08-26 MED ORDER — VERAPAMIL HCL 2.5 MG/ML IV SOLN
INTRAVENOUS | Status: AC
  Filled 2014-08-26: qty 2

## 2014-08-26 MED ORDER — HEPARIN (PORCINE) IN NACL 2-0.9 UNIT/ML-% IJ SOLN
INTRAMUSCULAR | Status: AC
  Filled 2014-08-26: qty 1000

## 2014-08-26 MED ORDER — SODIUM CHLORIDE 0.9 % IJ SOLN: 3.0000 mL | Freq: Two times a day (BID) | INTRAMUSCULAR | Status: DC

## 2014-08-26 MED ORDER — LIDOCAINE HCL (PF) 1 % IJ SOLN
INTRAMUSCULAR | Status: AC
  Filled 2014-08-26: qty 30

## 2014-08-26 MED ORDER — SODIUM CHLORIDE 0.9 % IV SOLN
INTRAVENOUS | Status: DC
  Administered 2014-08-26: 08:00:00 via INTRAVENOUS

## 2014-08-26 MED ORDER — HEPARIN SODIUM (PORCINE) 1000 UNIT/ML IJ SOLN
INTRAMUSCULAR | Status: AC
  Filled 2014-08-26: qty 1

## 2014-08-26 NOTE — Research (Signed)
Bioflow Informed Consent   Subject Name: Howard [name]  Subject met inclusion and exclusion criteria.  The informed consent form, study requirements and expectations were reviewed with the subject and questions and concerns were addressed prior to the signing of the consent form.  The subject verbalized understanding of the trail requirements.  The subject agreed to participate in the Bioflow trial and signed the informed consent.  The informed consent was obtained prior to performance of any protocol-specific procedures for the subject.  A copy of the signed informed consent was given to the subject and a copy was placed in the subject's medical record.  Sandie Ano 08/26/2014, 7:06

## 2014-08-26 NOTE — H&P (View-Only) (Signed)
08/24/2014 Howard [name]   [birth year]  [id number]  Primary Physicia Chrismon, DENNIS E, PA Primary Cardiologist: Dr. Rayann Heman  HPI:  Mr. Smelser presents to clinic today for followup and to review the results of his recent stress test and echocardiogram. He is an 80 year old male, followed by Dr. Rayann Heman, with a history of hypertension, hyperlipidemia, complete heart block status post St. Jude's permanent pacemaker implanted in 2011, obstructive sleep apnea compliant with CPAP therapy, and diabetes. He was recently seen at [facility] and evaluated for chest pain. He endorsed resting substernal chest discomfort that felt like indigestion, waking him from his sleep. Also described a chest tightness with radiation to his back. No other associated symptoms. Relieved with belching. His workup in the ED included negative troponins and negative CT of the chest. He was not admitted. He was discharged and instructed to followup in our office for further evaluation. He was evaluated by Sharrell Ku, PA-C. She recommended further evaluation with a nuclear stress test and 2-D echocardiogram.   His 2-D echo is notable for significant decrease in systolic function compared to his prior assessment in 2011. His EF has decreased from 50% down to 35-40%. On his most recent echo, dyskinesis of the mid-apical anteroseptal myocardium was also noted. His stress test was positive for scar and possible soft tissue attenuation in the inferior/inferoseptal/basal inferolateral and apical walls. However, "No significant ischemia". EF was estimated at 32%.   Today in clinic, he is chest pain free and denies any further recurrence in symptoms since his ED visit. His echo and stress test results were also reviewed by Dr. Mare Ferrari in the office today and he has recommended further evaluation with a diagnostic LHC.       Current Outpatient Prescriptions  Medication Sig Dispense Refill  . amLODipine  (NORVASC) 5 MG tablet Take 1 tablet (5 mg total) by mouth daily.  90 tablet  3  . aspirin 81 MG tablet Take 81 mg by mouth daily.        Marland Kitchen buPROPion (WELLBUTRIN XL) 300 MG 24 hr tablet Take 300 mg by mouth daily.      . Cholecalciferol (VITAMIN D3) 1000 UNITS CAPS Take 1,000 Units by mouth daily.       . Colesevelam HCl (WELCHOL) 3.75 G PACK Take 3.75 g by mouth daily. Mix in [place] of water and drink.      . hydrochlorothiazide 25 MG tablet Take 1 tablet (25 mg total) by mouth daily.  90 tablet  3  . metFORMIN (GLUCOPHAGE) 500 MG tablet Take 500 mg by mouth daily with lunch.       . metoprolol succinate (TOPROL-XL) 25 MG 24 hr tablet Take 25 mg by mouth every evening.      . Multiple Vitamin (MULTIVITAMIN) capsule Take 1 capsule by mouth daily.        Marland Kitchen omeprazole (PRILOSEC) 40 MG capsule Take 40 mg by mouth 2 (two) times daily.      . simvastatin (ZOCOR) 20 MG tablet Take 20 mg by mouth at bedtime.         No current facility-administered medications for this visit.    Allergies  Allergen Reactions  . Sulfonamide Derivatives Rash    History   Social History  . Marital Status: Married    Spouse Name: N/A    Number of Children: N/A  . Years of Education: N/A   Occupational History  . Not on file.   Social History Main Topics  .  Smoking status: Former Research scientist (life sciences)  . Smokeless tobacco: Not on file  . Alcohol Use: Yes     Comment: occasional  . Drug Use: No  . Sexual Activity: Not on file   Other Topics Concern  . Not on file   Social History Narrative  . No narrative on file     Review of Systems: General: negative for chills, fever, night sweats or weight changes.  Cardiovascular: negative for chest pain, dyspnea on exertion, edema, orthopnea, palpitations, paroxysmal nocturnal dyspnea or shortness of breath Dermatological: negative for rash Respiratory: negative for cough or wheezing Urologic: negative for hematuria Abdominal: negative for nausea, vomiting, diarrhea,  bright red blood per rectum, melena, or hematemesis Neurologic: negative for visual changes, syncope, or dizziness All other systems reviewed and are otherwise negative except as noted above.    Blood pressure 136/70, pulse 72, height 5\' 9"  (1.753 m), weight 191 lb (86.637 kg).  General appearance: alert, cooperative and no distress Neck: no carotid bruit and no JVD Lungs: clear to auscultation bilaterally Heart: regular rate and rhythm, S1, S2 normal, no murmur, click, rub or gallop Extremities: no LEE Pulses: 2+ and symmetric Skin: warm and dry Neurologic: Grossly normal  EKG Not performed  ASSESSMENT AND PLAN:   1. Reduced Systolic Function: New decrease in EF, down to 35-40%. This is down from 50-60% in 2011. This is also in the setting of recent episode of chest pain, concerning for angina. Although NST was interpreted as "No significant ischemia" it also demonstrated a low EF at 32%. Based on new findings, will plan for a diagnostic LHC for definitive assessment of his coronary anatomy.    PLAN  LHC with Dr. Martinique at [facility] 08/26/14  [place], [place] 08/24/2014 10:02 AM

## 2014-08-26 NOTE — CV Procedure (Signed)
    Cardiac Catheterization Procedure Note  Name: Howard [name] MRN: [id number] DOB: 1931-03-12  Procedure: Left Heart Cath, Selective Coronary Angiography, LV angiography  Indication: 80 yo WM s/p pacemaker. Evaluated for new onset cardiomyopathy. Myoview showed a fixed defect in the inferior and basal inferolateral wall without ischemia. EF 32%.   Procedural Details: The right wrist was prepped, draped, and anesthetized with 1% lidocaine. Using the modified Seldinger technique, a 6 French slender sheath was introduced into the right radial artery. 3 mg of verapamil was administered through the sheath, weight-based unfractionated heparin was administered intravenously. Standard Judkins catheters were used for selective coronary angiography and left ventriculography. The RCA was difficult to engage and was accessed with an ERAD right catheter.Catheter exchanges were performed over an exchange length guidewire. There were no immediate procedural complications. A TR band was used for radial hemostasis at the completion of the procedure.  The patient was transferred to the post catheterization recovery area for further monitoring.  Procedural Findings: Hemodynamics: AO 153/72 mean 104 mm hg LV 148/13  Coronary angiography: Coronary dominance: right  Left mainstem: Short, normal  Left anterior descending (LAD): there is moderate calcified plaque in the proximal vessel with diffuse 30% stenosis.  Ramus intermediate with 40% stenosis proximally.  Left circumflex (LCx): The LCx gives off a first OM then terminates in 2 small OM branches. There is 40% disease in the first OM. Otherwise no significant disease.  Right coronary artery (RCA): The RCA is a large dominant vessel. It gives off a large PDA and PLOM branch. There is a 30% stenosis in the proximal vessel. There is a 70% segmental stenosis in the distal RCA.  Left ventriculography: Left ventricular systolic function is abnormal, LVEF is  estimated at 30%. There is akinesis of the inferior wall with severe global hypokinesis. There is no significant mitral regurgitation   Final Conclusions:   1. Single vessel obstructive CAD 2. Severe LV dysfunction.  Recommendations: He has a moderate stenosis in the distal RCA. His LV dysfunction is out of proportion to the amount of CAD suggesting that this is a nonischemic cardiomyopathy. He denies any chest pain and recent myoview showed a fixed defect in the inferior wall without ischemia. For this reason I would not recommend PCI of the RCA lesion. Need to adjust heart failure medications.  Howard [name], [place]  08/26/2014, 9:41 AM

## 2014-08-26 NOTE — Progress Notes (Signed)
TR BAND REMOVAL  LOCATION:    right radial  DEFLATED PER PROTOCOL:    Yes.    TIME BAND OFF / DRESSING APPLIED:    1205   SITE UPON ARRIVAL:    Level 0  SITE AFTER BAND REMOVAL:    Level 0  CIRCULATION SENSATION AND MOVEMENT:    Within Normal Limits   Yes.    COMMENTS:   TEGADERM DSG APPLIED

## 2014-08-26 NOTE — Discharge Instructions (Signed)
Radial Site Care °Refer to this sheet in the next few weeks. These instructions provide you with information on caring for yourself after your procedure. Your caregiver may also give you more specific instructions. Your treatment has been planned according to current medical practices, but problems sometimes occur. Call your caregiver if you have any problems or questions after your procedure. °HOME CARE INSTRUCTIONS °· You may shower the day after the procedure. Remove the bandage (dressing) and gently wash the site with plain soap and water. Gently pat the site dry. °· Do not apply powder or lotion to the site. °· Do not submerge the affected site in water for 3 to 5 days. °· Inspect the site at least twice daily. °· Do not flex or bend the affected arm for 24 hours. °· No lifting over 5 pounds (2.3 kg) for 5 days after your procedure. °· Do not drive home if you are discharged the same day of the procedure. Have someone else drive you. °· You may drive 24 hours after the procedure unless otherwise instructed by your caregiver. °· Do not operate machinery or power tools for 24 hours. °· A responsible adult should be with you for the first 24 hours after you arrive home. °What to expect: °· Any bruising will usually fade within 1 to 2 weeks. °· Blood that collects in the tissue (hematoma) may be painful to the touch. It should usually decrease in size and tenderness within 1 to 2 weeks. °SEEK IMMEDIATE MEDICAL CARE IF: °· You have unusual pain at the radial site. °· You have redness, warmth, swelling, or pain at the radial site. °· You have drainage (other than a small amount of blood on the dressing). °· You have chills. °· You have a fever or persistent symptoms for more than 72 hours. °· You have a fever and your symptoms suddenly get worse. °· Your arm becomes pale, cool, tingly, or numb. °· You have heavy bleeding from the site. Hold pressure on the site. °Document Released: 01/11/2011 Document Revised:  03/02/2012 Document Reviewed: 01/11/2011 °ExitCare® Patient Information ©2015 [employer]. This information is not intended to replace advice given to you by your health care provider. Make sure you discuss any questions you have with your health care provider. ° °

## 2014-08-26 NOTE — Interval H&P Note (Signed)
History and Physical Interval Note:  08/26/2014 8:44 AM  Howard [name]  has presented today for surgery, with the diagnosis of decrease ef/hf  The various methods of treatment have been discussed with the patient and family. After consideration of risks, benefits and other options for treatment, the patient has consented to  Procedure(s): LEFT HEART CATHETERIZATION WITH CORONARY ANGIOGRAM (N/A) as a surgical intervention .  The patient's history has been reviewed, patient examined, no change in status, stable for surgery.  I have reviewed the patient's chart and labs.  Questions were answered to the patient's satisfaction.   Cath Lab Visit (complete for each Cath Lab visit)  Clinical Evaluation Leading to the Procedure:   ACS: No.  Non-ACS:    Anginal Classification: CCS III  Anti-ischemic medical therapy: Maximal Therapy (2 or more classes of medications)  Non-Invasive Test Results: High-risk stress test findings: cardiac mortality >3%/year  Prior CABG: No previous CABG        Collier Salina [facility] 08/26/2014 8:44 AM

## 2014-09-04 DIAGNOSIS — R109 Unspecified abdominal pain: Secondary | ICD-10-CM | POA: Diagnosis not present

## 2014-09-04 DIAGNOSIS — J9819 Other pulmonary collapse: Secondary | ICD-10-CM | POA: Diagnosis not present

## 2014-09-04 DIAGNOSIS — K810 Acute cholecystitis: Secondary | ICD-10-CM | POA: Diagnosis not present

## 2014-09-04 DIAGNOSIS — I77811 Abdominal aortic ectasia: Secondary | ICD-10-CM | POA: Diagnosis not present

## 2014-09-04 DIAGNOSIS — K8020 Calculus of gallbladder without cholecystitis without obstruction: Secondary | ICD-10-CM | POA: Diagnosis not present

## 2014-09-04 DIAGNOSIS — N281 Cyst of kidney, acquired: Secondary | ICD-10-CM | POA: Diagnosis not present

## 2014-09-04 LAB — BASIC METABOLIC PANEL
ANION GAP: 12 (ref 7–16)
BUN: 16 mg/dL (ref 7–18)
CHLORIDE: 100 mmol/L (ref 98–107)
CO2: 24 mmol/L (ref 21–32)
CREATININE: 0.95 mg/dL (ref 0.60–1.30)
Calcium, Total: 9 mg/dL (ref 8.5–10.1)
EGFR (Non-African Amer.): >60
GLUCOSE: 150 mg/dL — AB (ref 65–99)
OSMOLALITY: 276 (ref 275–301)
Potassium: 3.9 mmol/L (ref 3.5–5.1)
SODIUM: 136 mmol/L (ref 136–145)

## 2014-09-04 LAB — CBC
HCT: 46.2 % (ref 40.0–52.0)
HGB: 15.3 g/dL (ref 13.0–18.0)
MCH: 26.7 pg (ref 26.0–34.0)
MCHC: 33.1 g/dL (ref 32.0–36.0)
MCV: 81 fL (ref 80–100)
PLATELETS: 235 10*3/uL (ref 150–440)
RBC: 5.74 10*6/uL (ref 4.40–5.90)
RDW: 14.7 % — AB (ref 11.5–14.5)
WBC: 9.5 10*3/uL (ref 3.8–10.6)

## 2014-09-04 LAB — TROPONIN I: Troponin-I: <0.02 ng/mL

## 2014-09-04 LAB — PRO B NATRIURETIC PEPTIDE: B-TYPE NATIURETIC PEPTID: 2762 pg/mL — AB (ref 0–450)

## 2014-09-05 ENCOUNTER — Ambulatory Visit: Payer: Federal, State, Local not specified - PPO | Admitting: Physician Assistant

## 2014-09-05 ENCOUNTER — Other Ambulatory Visit: Payer: Self-pay | Admitting: Nurse Practitioner

## 2014-09-05 ENCOUNTER — Inpatient Hospital Stay: Payer: Self-pay | Admitting: Surgery

## 2014-09-05 ENCOUNTER — Encounter: Payer: Self-pay | Admitting: Nurse Practitioner

## 2014-09-05 DIAGNOSIS — I5042 Chronic combined systolic (congestive) and diastolic (congestive) heart failure: Secondary | ICD-10-CM | POA: Diagnosis not present

## 2014-09-05 DIAGNOSIS — K560 Paralytic ileus: Secondary | ICD-10-CM | POA: Diagnosis not present

## 2014-09-05 DIAGNOSIS — I428 Other cardiomyopathies: Secondary | ICD-10-CM | POA: Diagnosis not present

## 2014-09-05 DIAGNOSIS — Z950 Presence of cardiac pacemaker: Secondary | ICD-10-CM | POA: Diagnosis not present

## 2014-09-05 DIAGNOSIS — I5022 Chronic systolic (congestive) heart failure: Secondary | ICD-10-CM | POA: Diagnosis not present

## 2014-09-05 DIAGNOSIS — I447 Left bundle-branch block, unspecified: Secondary | ICD-10-CM | POA: Diagnosis present

## 2014-09-05 DIAGNOSIS — E119 Type 2 diabetes mellitus without complications: Secondary | ICD-10-CM | POA: Diagnosis present

## 2014-09-05 DIAGNOSIS — Z23 Encounter for immunization: Secondary | ICD-10-CM | POA: Diagnosis not present

## 2014-09-05 DIAGNOSIS — I442 Atrioventricular block, complete: Secondary | ICD-10-CM | POA: Diagnosis not present

## 2014-09-05 DIAGNOSIS — K810 Acute cholecystitis: Secondary | ICD-10-CM | POA: Diagnosis not present

## 2014-09-05 DIAGNOSIS — K219 Gastro-esophageal reflux disease without esophagitis: Secondary | ICD-10-CM | POA: Diagnosis present

## 2014-09-05 DIAGNOSIS — G4730 Sleep apnea, unspecified: Secondary | ICD-10-CM | POA: Diagnosis present

## 2014-09-05 DIAGNOSIS — I10 Essential (primary) hypertension: Secondary | ICD-10-CM | POA: Diagnosis present

## 2014-09-05 DIAGNOSIS — N281 Cyst of kidney, acquired: Secondary | ICD-10-CM | POA: Diagnosis not present

## 2014-09-05 DIAGNOSIS — I509 Heart failure, unspecified: Secondary | ICD-10-CM | POA: Diagnosis not present

## 2014-09-05 DIAGNOSIS — K8000 Calculus of gallbladder with acute cholecystitis without obstruction: Secondary | ICD-10-CM | POA: Diagnosis not present

## 2014-09-05 DIAGNOSIS — R109 Unspecified abdominal pain: Secondary | ICD-10-CM | POA: Diagnosis not present

## 2014-09-05 DIAGNOSIS — Z8546 Personal history of malignant neoplasm of prostate: Secondary | ICD-10-CM | POA: Diagnosis not present

## 2014-09-05 DIAGNOSIS — K821 Hydrops of gallbladder: Secondary | ICD-10-CM | POA: Diagnosis not present

## 2014-09-05 DIAGNOSIS — Z96659 Presence of unspecified artificial knee joint: Secondary | ICD-10-CM | POA: Diagnosis not present

## 2014-09-05 DIAGNOSIS — K819 Cholecystitis, unspecified: Secondary | ICD-10-CM | POA: Diagnosis not present

## 2014-09-05 DIAGNOSIS — R1013 Epigastric pain: Secondary | ICD-10-CM | POA: Diagnosis not present

## 2014-09-05 DIAGNOSIS — I77811 Abdominal aortic ectasia: Secondary | ICD-10-CM | POA: Diagnosis not present

## 2014-09-05 DIAGNOSIS — Z85828 Personal history of other malignant neoplasm of skin: Secondary | ICD-10-CM | POA: Diagnosis not present

## 2014-09-05 DIAGNOSIS — I2510 Atherosclerotic heart disease of native coronary artery without angina pectoris: Secondary | ICD-10-CM | POA: Diagnosis present

## 2014-09-05 DIAGNOSIS — Z7982 Long term (current) use of aspirin: Secondary | ICD-10-CM | POA: Diagnosis not present

## 2014-09-05 DIAGNOSIS — J9819 Other pulmonary collapse: Secondary | ICD-10-CM | POA: Diagnosis not present

## 2014-09-05 DIAGNOSIS — K8020 Calculus of gallbladder without cholecystitis without obstruction: Secondary | ICD-10-CM | POA: Diagnosis not present

## 2014-09-05 DIAGNOSIS — E785 Hyperlipidemia, unspecified: Secondary | ICD-10-CM | POA: Diagnosis present

## 2014-09-05 LAB — URINALYSIS, COMPLETE
BACTERIA: NONE SEEN
Bilirubin,UR: NEGATIVE
Blood: NEGATIVE
Glucose,UR: 150 mg/dL (ref 0–75)
Hyaline Cast: 1
Leukocyte Esterase: NEGATIVE
NITRITE: NEGATIVE
PH: 5 (ref 4.5–8.0)
Protein: 30
SQUAMOUS EPITHELIAL: NONE SEEN
Specific Gravity: 1.018 (ref 1.003–1.030)
WBC UR: NONE SEEN /HPF (ref 0–5)

## 2014-09-05 LAB — HEPATIC FUNCTION PANEL A (ARMC)
ALK PHOS: 74 U/L
ALT: 28 U/L
AST: 29 U/L (ref 15–37)
Albumin: 3.7 g/dL (ref 3.4–5.0)
BILIRUBIN TOTAL: 0.3 mg/dL (ref 0.2–1.0)
Bilirubin, Direct: <0.1 mg/dL (ref 0.00–0.20)
Total Protein: 7.5 g/dL (ref 6.4–8.2)

## 2014-09-05 LAB — LIPASE, BLOOD: Lipase: 198 U/L (ref 73–393)

## 2014-09-06 LAB — CBC WITH DIFFERENTIAL/PLATELET
Basophil #: 0.1 10*3/uL (ref 0.0–0.1)
Basophil %: 0.4 %
EOS ABS: 0.1 10*3/uL (ref 0.0–0.7)
Eosinophil %: 0.9 %
HCT: 43.4 % (ref 40.0–52.0)
HGB: 14.1 g/dL (ref 13.0–18.0)
LYMPHS PCT: 10.3 %
Lymphocyte #: 1.2 10*3/uL (ref 1.0–3.6)
MCH: 26.3 pg (ref 26.0–34.0)
MCHC: 32.5 g/dL (ref 32.0–36.0)
MCV: 81 fL (ref 80–100)
MONO ABS: 1.3 x10 3/mm — AB (ref 0.2–1.0)
MONOS PCT: 10.7 %
Neutrophil #: 9.2 10*3/uL — ABNORMAL HIGH (ref 1.4–6.5)
Neutrophil %: 77.7 %
PLATELETS: 204 10*3/uL (ref 150–440)
RBC: 5.37 10*6/uL (ref 4.40–5.90)
RDW: 14.8 % — ABNORMAL HIGH (ref 11.5–14.5)
WBC: 11.9 10*3/uL — ABNORMAL HIGH (ref 3.8–10.6)

## 2014-09-06 LAB — COMPREHENSIVE METABOLIC PANEL
ALBUMIN: 3.4 g/dL (ref 3.4–5.0)
ALK PHOS: 67 U/L
AST: 17 U/L (ref 15–37)
Anion Gap: 6 — ABNORMAL LOW (ref 7–16)
BILIRUBIN TOTAL: 0.6 mg/dL (ref 0.2–1.0)
BUN: 16 mg/dL (ref 7–18)
Calcium, Total: 9 mg/dL (ref 8.5–10.1)
Chloride: 97 mmol/L — ABNORMAL LOW (ref 98–107)
Co2: 27 mmol/L (ref 21–32)
Creatinine: 0.8 mg/dL (ref 0.60–1.30)
EGFR (African American): >60
EGFR (Non-African Amer.): >60
Glucose: 166 mg/dL — ABNORMAL HIGH (ref 65–99)
OSMOLALITY: 266 (ref 275–301)
Potassium: 3.8 mmol/L (ref 3.5–5.1)
SGPT (ALT): 22 U/L
SODIUM: 130 mmol/L — AB (ref 136–145)
TOTAL PROTEIN: 7.1 g/dL (ref 6.4–8.2)

## 2014-09-07 DIAGNOSIS — K821 Hydrops of gallbladder: Secondary | ICD-10-CM | POA: Diagnosis not present

## 2014-09-07 HISTORY — PX: CHOLECYSTECTOMY: SHX55

## 2014-09-08 LAB — PATHOLOGY REPORT

## 2014-09-14 ENCOUNTER — Telehealth: Payer: Self-pay | Admitting: Internal Medicine

## 2014-09-14 NOTE — Telephone Encounter (Signed)
He should probably establish w general cardiology in [place] given CAD.  Refer to [employer]. I am happy to follow PPM or he can see Dr Caryl Comes in [place] if that is more convenient.

## 2014-09-14 NOTE — Telephone Encounter (Addendum)
Called patient's wife back and she wants to know who he should follow up with.  He has had multiple hospitalizations() and lives in [place].  Her question is does he need a Film/video editor and have Dr Rayann Heman to follow device (PPM) or can Dr Rayann Heman do it all.  He was suppose to come in to titrate medications for CHF but had a gall bladder attack and had to have surgery.  He is now recovering from that.  I will forward to Dr. Rayann Heman for advice

## 2014-09-14 NOTE — Telephone Encounter (Signed)
New Message  Pt wife request a call back to discuss a complicated situation. (No further details) Please call.

## 2014-09-16 NOTE — Telephone Encounter (Addendum)
lft message on voicemail appointment made with Dr [name] on 10/815 at 3pm.  Also let him know he could follow with Dr Caryl Comes in [place] if he wanted but he will need to call us.  He can follow with Dr Rayann Heman here is [place] if he chooses

## 2014-09-21 ENCOUNTER — Other Ambulatory Visit: Payer: Self-pay

## 2014-09-26 ENCOUNTER — Encounter: Payer: Self-pay | Admitting: Internal Medicine

## 2014-09-26 ENCOUNTER — Ambulatory Visit (INDEPENDENT_AMBULATORY_CARE_PROVIDER_SITE_OTHER): Payer: Medicare Other | Admitting: Internal Medicine

## 2014-09-26 VITALS — BP 138/72 | HR 70 | Ht 69.0 in | Wt 181.1 lb

## 2014-09-26 DIAGNOSIS — Z950 Presence of cardiac pacemaker: Secondary | ICD-10-CM

## 2014-09-26 DIAGNOSIS — I442 Atrioventricular block, complete: Secondary | ICD-10-CM

## 2014-09-26 DIAGNOSIS — I10 Essential (primary) hypertension: Secondary | ICD-10-CM

## 2014-09-26 DIAGNOSIS — I429 Cardiomyopathy, unspecified: Secondary | ICD-10-CM | POA: Insufficient documentation

## 2014-09-26 DIAGNOSIS — I428 Other cardiomyopathies: Secondary | ICD-10-CM

## 2014-09-26 LAB — MDC_IDC_ENUM_SESS_TYPE_INCLINIC
Battery Voltage: 2.92 V
Brady Statistic RA Percent Paced: 3.2 %
Brady Statistic RV Percent Paced: 99.95 %
Date Time Interrogation Session: 20151005091210
Implantable Pulse Generator Model: 2210
Lead Channel Impedance Value: 337.5 Ohm
Lead Channel Impedance Value: 425 Ohm
Lead Channel Sensing Intrinsic Amplitude: 4.1 mV
Lead Channel Setting Pacing Amplitude: 2 V
Lead Channel Setting Pacing Amplitude: 2.5 V
MDC IDC MSMT BATTERY REMAINING LONGEVITY: 64.8 mo
MDC IDC MSMT LEADCHNL RA PACING THRESHOLD AMPLITUDE: 0.75 V
MDC IDC MSMT LEADCHNL RA PACING THRESHOLD PULSEWIDTH: 0.4 ms
MDC IDC MSMT LEADCHNL RV PACING THRESHOLD AMPLITUDE: 0.75 V
MDC IDC MSMT LEADCHNL RV PACING THRESHOLD PULSEWIDTH: 0.8 ms
MDC IDC MSMT LEADCHNL RV SENSING INTR AMPL: 12 mV
MDC IDC PG SERIAL: 7196739
MDC IDC SET LEADCHNL RV PACING PULSEWIDTH: 0.8 ms
MDC IDC SET LEADCHNL RV SENSING SENSITIVITY: 4 mV

## 2014-09-26 NOTE — Patient Instructions (Signed)
Your physician wants you to follow-up in: 12 months with Dr Vallery Ridge will receive a reminder letter in the mail two months in advance. If you don't receive a letter, please call our office to schedule the follow-up appointment.    Remote monitoring is used to monitor your Pacemaker or ICD from home. This monitoring reduces the number of office visits required to check your device to one time per year. It allows [place] to keep an eye on the functioning of your device to ensure it is working properly. You are scheduled for a device check from home on 12/27/14. You may send your transmission at any time that day. If you have a wireless device, the transmission will be sent automatically. After your physician reviews your transmission, you will receive a postcard with your next transmission date.    Thank you for choosing [facility]!!     Janan Halter, RN [phone]

## 2014-09-26 NOTE — Progress Notes (Signed)
PCP: Chrismon, DENNIS E, PA  Howard [name] is a 80 y.o. male who presents today for electrophysiology followup.  Since last being seen in our clinic, the patient reports doing reasonably well. He was recently hospitalized with gall bladder disease.  Cath revealed reduced EF with nonobstructive CAD.  He has been managed medically and is doing well.  He is primarily limited by spinal stenosis. Today, he denies symptoms of palpitations, chest pain, shortness of breath,  lower extremity edema, dizziness, presyncope, or syncope.  The patient is otherwise without complaint today. He lives in [place].  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  He prefers to have his device followed in our office.  Past Medical History  Diagnosis Date  . Hyperlipidemia   . Hypertension   . Complete heart block     a. 11/2010 s/p SJM 2210 [employer] DC PPM, ser# [id number].  . WPW (Wolff-Parkinson-White syndrome)     a. S/P RFCA 1991.  . Sleep apnea     a. cpap  . Diabetes mellitus without complication   . LBBB (left bundle branch block)   . GERD (gastroesophageal reflux disease)   . Arthritis   . Cancer     prostate and skin  . Non-obstructive CAD     a. 07/2014 Abnl MV;  b. 08/2014 Cath: LM nl, LAD 30p, RI 40p, LCX nl, OM1 40, RCA dominant 30p, 70d-->Med Rx.  Marland Kitchen NICM (nonischemic cardiomyopathy)     a. 07/2014 Echo: EF 35-40%, mid-apicalanteroseptal DK, Gr 1 DD, mild-mod dil LA.  Marland Kitchen Chronic combined systolic and diastolic CHF, NYHA class 1     a. 07/2014 Echo: EF 35-40%, Gr 1 DD.  Marland Kitchen Vertigo   . Depression    Past Surgical History  Procedure Laterality Date  . Ruptured disc      1962 and 1998  . Cervical fusion    . Knee surgery      left knee 1991 and 1992; right knee 1995  . Prostate surgery      cancer--1998  . Replacement total knee      2004  . Hand surgery      right 1993; left 2005  . Back surgery      2011  . Pacemaker insertion      PPM-- St Jude 11/30/10 by [name]  . Catheter ablation  1991   for WPW  . Joint replacement Left 2013    knee  . Joint replacement Right 2004    knee  . Lumbar laminectomy/decompression microdiscectomy N/A 06/07/2014    Procedure: LUMBAR FOUR TO FIVE LUMBAR LAMINECTOMY/DECOMPRESSION MICRODISCECTOMY 1 LEVEL;  Surgeon: [name], MD;  Location: [facility];  Service: Neurosurgery;  Laterality: N/A;    Current Outpatient Prescriptions  Medication Sig Dispense Refill  . amLODipine (NORVASC) 5 MG tablet Take 1 tablet (5 mg total) by mouth daily.  90 tablet  3  . aspirin 81 MG tablet Take 81 mg by mouth daily.        Marland Kitchen buPROPion (WELLBUTRIN XL) 300 MG 24 hr tablet Take 300 mg by mouth daily.      . Cholecalciferol (VITAMIN D3) 1000 UNITS CAPS Take 1,000 Units by mouth daily.       . Colesevelam HCl (WELCHOL) 3.75 G PACK Take 3.75 g by mouth daily. Mix in [place] of water and drink.      . hydrochlorothiazide 25 MG tablet Take 1 tablet (25 mg total) by mouth daily.  [place]  tablet  3  . metFORMIN (GLUCOPHAGE) 500 MG tablet Take 1 tablet by mouth daily.      . metoprolol succinate (TOPROL-XL) 25 MG 24 hr tablet Take 25 mg by mouth every evening.      . Multiple Vitamin (MULTIVITAMIN) capsule Take 1 capsule by mouth daily.        Marland Kitchen omeprazole (PRILOSEC) 40 MG capsule Take 40 mg by mouth 2 (two) times daily.      . simvastatin (ZOCOR) 20 MG tablet Take 20 mg by mouth at bedtime.         No current facility-administered medications for this visit.    Physical Exam: Filed Vitals:   09/26/14 0853  BP: 138/72  Pulse: 70  Height: 5\' 9"  (1.753 m)  Weight: 181 lb 1.9 oz (82.155 kg)    GEN- The patient is well appearing, alert and oriented x 3 today.   Head- normocephalic, atraumatic Eyes-  Sclera clear, conjunctiva pink Ears- hearing intact Oropharynx- clear Lungs- Clear to ausculation bilaterally, normal work of breathing Chest- pacemaker pocket is well healed Heart- Regular rate and rhythm, no murmurs, rubs or gallops, PMI not laterally displaced GI- soft,  NT, ND, + BS Extremities- no clubbing, cyanosis, or edema  Pacemaker interrogation- reviewed in detail today,  See PACEART report  Assessment and Plan:   Complete heart block  Normal pacemaker function  See Pace Art report  No changes today  [place] checks every 3 months, return in 12 months  Essential hypertension  Stable No change required today  Nonischemic CM Reduced EF Given NYHA Class I/II symptoms, I would not advise upgrade to CRT at this time.   Given advanced age, would not recommend ICD upgrade. Would continue medical optimization as per Dr Erskine Speed Return in 1 year

## 2014-09-29 ENCOUNTER — Ambulatory Visit (INDEPENDENT_AMBULATORY_CARE_PROVIDER_SITE_OTHER): Payer: Medicare Other | Admitting: Cardiovascular Disease

## 2014-09-29 ENCOUNTER — Encounter: Payer: Self-pay | Admitting: Cardiovascular Disease

## 2014-09-29 VITALS — BP 140/80 | HR 72 | Ht 69.0 in | Wt 182.0 lb

## 2014-09-29 DIAGNOSIS — E785 Hyperlipidemia, unspecified: Secondary | ICD-10-CM

## 2014-09-29 DIAGNOSIS — I428 Other cardiomyopathies: Secondary | ICD-10-CM

## 2014-09-29 DIAGNOSIS — I10 Essential (primary) hypertension: Secondary | ICD-10-CM | POA: Diagnosis not present

## 2014-09-29 DIAGNOSIS — Z9049 Acquired absence of other specified parts of digestive tract: Secondary | ICD-10-CM

## 2014-09-29 DIAGNOSIS — E119 Type 2 diabetes mellitus without complications: Secondary | ICD-10-CM | POA: Diagnosis not present

## 2014-09-29 DIAGNOSIS — K929 Disease of digestive system, unspecified: Secondary | ICD-10-CM | POA: Insufficient documentation

## 2014-09-29 DIAGNOSIS — I442 Atrioventricular block, complete: Secondary | ICD-10-CM

## 2014-09-29 DIAGNOSIS — I429 Cardiomyopathy, unspecified: Secondary | ICD-10-CM

## 2014-09-29 MED ORDER — LISINOPRIL 10 MG PO TABS: 10.0000 mg | ORAL_TABLET | Freq: Every day | ORAL | Status: DC

## 2014-09-29 NOTE — Assessment & Plan Note (Signed)
We will add lisinopril 10 mg daily given his systolic dysfunction and underlying diabetes Continue his other medications In the future could consider changing his metoprolol to carvedilol

## 2014-09-29 NOTE — Assessment & Plan Note (Signed)
He reports cholesterol has been running high even on simvastatin 20 mg daily. We'll try to obtain his most recent lipid panel

## 2014-09-29 NOTE — Progress Notes (Signed)
Patient ID: Howard [name], male    DOB: May 08, 1931, 80 y.o.   MRN: [id number]  HPI Comments: Howard [name] is a 80 year old-year-old gentleman, patient of [facility], with a hx of complete heart block, status post pacemaker in 2011, nonischemic cardiomyopathy, 70% distal RCA disease, 30 and 40% lesions in his ramus, LAD, with recent admission to the hospital 09/05/2012 with discharge 09/09/2014 with acute cholecystitis, ileus.  In early September 2015 he was seen in the [place] office with symptoms of chest pain and shortness of breath,  he had a Abnormal Myoview  cardiac catheterization showing details above Echocardiogram showing ejection fraction 35-40%  After discharge she continued to have similar symptoms of epigastric discomfort radiating through to his back Admitted to the hospital for cholecystectomy  In followup today, he reports that he is doing well, feeling back to his baseline He was told to followup in our office for his depressed ejection fraction and heart failure. He has been told by primary care that his cholesterol is elevated. He has been reluctant to take more medication He does not do any regular exercise He reports that his sugars have been running high, hemoglobin A1c in the high 7 range  EKG shows paced rhythm with  rate 72 beats per minute   Outpatient Encounter Prescriptions as of 09/29/2014  Medication Sig  . amLODipine (NORVASC) 5 MG tablet Take 1 tablet (5 mg total) by mouth daily.  Marland Kitchen aspirin 81 MG tablet Take 81 mg by mouth daily.    Marland Kitchen buPROPion (WELLBUTRIN XL) 300 MG 24 hr tablet Take 300 mg by mouth daily.  . Cholecalciferol (VITAMIN D3) 1000 UNITS CAPS Take 1,000 Units by mouth daily.   . Colesevelam HCl (WELCHOL) 3.75 G PACK Take 3.75 g by mouth daily. Mix in [place] of water and drink.  . hydrochlorothiazide 25 MG tablet Take 1 tablet (25 mg total) by mouth daily.  . metFORMIN (GLUCOPHAGE) 500 MG tablet Take 1 tablet by mouth daily.  .  metoprolol succinate (TOPROL-XL) 25 MG 24 hr tablet Take 25 mg by mouth every evening.  . Multiple Vitamin (MULTIVITAMIN) capsule Take 1 capsule by mouth daily.    Marland Kitchen omeprazole (PRILOSEC) 40 MG capsule Take 40 mg by mouth 2 (two) times daily.  . simvastatin (ZOCOR) 20 MG tablet Take 20 mg by mouth at bedtime.      Review of Systems  Constitutional: Negative.   HENT: Negative.   Eyes: Negative.   Respiratory: Negative.   Cardiovascular: Negative.   Gastrointestinal: Negative.   Endocrine: Negative.   Musculoskeletal: Negative.   Skin: Negative.   Allergic/Immunologic: Negative.   Neurological: Negative.   Hematological: Negative.   Psychiatric/Behavioral: Negative.   All other systems reviewed and are negative.   BP 140/80  Pulse 72  Ht 5\' 9"  (1.753 m)  Wt 182 lb (82.555 kg)  BMI 26.86 kg/m2  Physical Exam  Nursing note and vitals reviewed. Constitutional: He is oriented to person, place, and time. He appears well-developed and well-nourished.  HENT:  Head: Normocephalic.  Nose: Nose normal.  Mouth/Throat: Oropharynx is clear and moist.  Eyes: Conjunctivae are normal. Pupils are equal, round, and reactive to light.  Neck: Normal range of motion. Neck supple. No JVD present.  Cardiovascular: Normal rate, regular rhythm, S1 normal, S2 normal, normal heart sounds and intact distal pulses.  Exam reveals no gallop and no friction rub.   No murmur heard. Pulmonary/Chest: Effort normal and breath sounds normal. No respiratory distress. He has  no wheezes. He has no rales. He exhibits no tenderness.  Abdominal: Soft. Bowel sounds are normal. He exhibits no distension. There is no tenderness.  Musculoskeletal: Normal range of motion. He exhibits no edema and no tenderness.  Lymphadenopathy:    He has no cervical adenopathy.  Neurological: He is alert and oriented to person, place, and time. Coordination normal.  Skin: Skin is warm and dry. No rash noted. No erythema.  Psychiatric:  He has a normal mood and affect. His behavior is normal. Judgment and thought content normal.      Assessment and Plan

## 2014-09-29 NOTE — Assessment & Plan Note (Signed)
Ejection fraction 35% to 40% on recent echocardiogram. We'll add lisinopril, consider changing to carvedilol at a later date He appears relatively euvolemic

## 2014-09-29 NOTE — Assessment & Plan Note (Signed)
Pacemaker followed by EP in [place]. Recent evaluation last week. Pacer is functioning well

## 2014-09-29 NOTE — Assessment & Plan Note (Addendum)
Recent laparoscopic cholecystectomy. He has recovered well without complications. This was done at [facility] records reviewed

## 2014-09-29 NOTE — Patient Instructions (Addendum)
You are doing well.  Please start lisinopril one a day, to strengthen the heart I will get your cholesterol numbers  Please call us if you have new issues that need to be addressed before your next appt.  Your physician wants you to follow-up in: 6 months.  You will receive a reminder letter in the mail two months in advance. If you don't receive a letter, please call our office to schedule the follow-up appointment.

## 2014-09-29 NOTE — Assessment & Plan Note (Signed)
We have encouraged continued exercise, careful diet management in an effort to lose weight. 

## 2014-10-14 DIAGNOSIS — H0011 Chalazion right upper eyelid: Secondary | ICD-10-CM | POA: Diagnosis not present

## 2014-10-17 ENCOUNTER — Telehealth: Payer: Self-pay

## 2014-10-17 NOTE — Telephone Encounter (Signed)
Pt wife called, states Dr. [name] was going to change w pt cholesterol med, but wanted to get updated labs 1st. Please call before 2 or after 4

## 2014-10-18 NOTE — Telephone Encounter (Signed)
Total cholesterol is 170, LDL 78 Ideally should be less than 150, less than 70 If he would like, could increase simvastatin to 40 mg daily

## 2014-10-18 NOTE — Telephone Encounter (Signed)
Recent lipids scanned for your review.

## 2014-10-18 NOTE — Telephone Encounter (Signed)
Dr. Evert Kohl office is sending most recent lipid panel.

## 2014-10-19 MED ORDER — SIMVASTATIN 40 MG PO TABS: 40.0000 mg | ORAL_TABLET | Freq: Every day | ORAL | Status: DC

## 2014-10-19 NOTE — Telephone Encounter (Signed)
Reviewed results w/ pt.   Advised him of Dr. Donivan Scull recommendation.  He verbalizes understanding and will call back w/ any questions or concerns.

## 2014-10-20 ENCOUNTER — Telehealth: Payer: Self-pay | Admitting: Cardiovascular Disease

## 2014-10-20 ENCOUNTER — Other Ambulatory Visit: Payer: Self-pay | Admitting: *Deleted

## 2014-10-20 MED ORDER — SIMVASTATIN 40 MG PO TABS: 40.0000 mg | ORAL_TABLET | Freq: Every day | ORAL | Status: DC

## 2014-10-20 NOTE — Telephone Encounter (Signed)
Simvastatin 40 mg # 90 R#3 to [facility].

## 2014-10-20 NOTE — Telephone Encounter (Signed)
Requested Prescriptions   Signed Prescriptions Disp Refills  . simvastatin (ZOCOR) 40 MG tablet 90 tablet 3    Sig: Take 1 tablet (40 mg total) by mouth at bedtime.    Authorizing Provider: [name]    Ordering User: Britt Bottom

## 2014-10-20 NOTE — Telephone Encounter (Signed)
Needs refill on Simvastatin 90 day supply for a year. Sent to [facility].

## 2014-10-25 ENCOUNTER — Other Ambulatory Visit: Payer: Self-pay | Admitting: *Deleted

## 2014-10-25 MED ORDER — SIMVASTATIN 40 MG PO TABS: 40.0000 mg | ORAL_TABLET | Freq: Every day | ORAL | Status: DC

## 2014-10-25 NOTE — Telephone Encounter (Signed)
90 day supply w/ 3 renewals

## 2014-10-28 DIAGNOSIS — G5600 Carpal tunnel syndrome, unspecified upper limb: Secondary | ICD-10-CM | POA: Insufficient documentation

## 2014-10-28 DIAGNOSIS — G5601 Carpal tunnel syndrome, right upper limb: Secondary | ICD-10-CM | POA: Diagnosis not present

## 2014-10-28 DIAGNOSIS — M65352 Trigger finger, left little finger: Secondary | ICD-10-CM | POA: Diagnosis not present

## 2014-10-31 DIAGNOSIS — H8111 Benign paroxysmal vertigo, right ear: Secondary | ICD-10-CM | POA: Diagnosis not present

## 2014-10-31 DIAGNOSIS — X32XXXA Exposure to sunlight, initial encounter: Secondary | ICD-10-CM | POA: Diagnosis not present

## 2014-10-31 DIAGNOSIS — D0439 Carcinoma in situ of skin of other parts of face: Secondary | ICD-10-CM | POA: Diagnosis not present

## 2014-10-31 DIAGNOSIS — L905 Scar conditions and fibrosis of skin: Secondary | ICD-10-CM | POA: Diagnosis not present

## 2014-10-31 DIAGNOSIS — D485 Neoplasm of uncertain behavior of skin: Secondary | ICD-10-CM | POA: Diagnosis not present

## 2014-10-31 DIAGNOSIS — H8112 Benign paroxysmal vertigo, left ear: Secondary | ICD-10-CM | POA: Diagnosis not present

## 2014-10-31 DIAGNOSIS — L570 Actinic keratosis: Secondary | ICD-10-CM | POA: Diagnosis not present

## 2014-10-31 DIAGNOSIS — D045 Carcinoma in situ of skin of trunk: Secondary | ICD-10-CM | POA: Diagnosis not present

## 2014-10-31 DIAGNOSIS — Z85828 Personal history of other malignant neoplasm of skin: Secondary | ICD-10-CM | POA: Diagnosis not present

## 2014-11-10 DIAGNOSIS — J9811 Atelectasis: Secondary | ICD-10-CM | POA: Diagnosis not present

## 2014-11-10 DIAGNOSIS — S0003XA Contusion of scalp, initial encounter: Secondary | ICD-10-CM | POA: Diagnosis not present

## 2014-11-10 DIAGNOSIS — W19XXXA Unspecified fall, initial encounter: Secondary | ICD-10-CM | POA: Diagnosis not present

## 2014-11-10 DIAGNOSIS — E119 Type 2 diabetes mellitus without complications: Secondary | ICD-10-CM | POA: Diagnosis not present

## 2014-11-10 DIAGNOSIS — I10 Essential (primary) hypertension: Secondary | ICD-10-CM | POA: Diagnosis not present

## 2014-11-10 DIAGNOSIS — R55 Syncope and collapse: Secondary | ICD-10-CM | POA: Diagnosis not present

## 2014-11-10 DIAGNOSIS — M5030 Other cervical disc degeneration, unspecified cervical region: Secondary | ICD-10-CM | POA: Diagnosis not present

## 2014-11-10 DIAGNOSIS — K219 Gastro-esophageal reflux disease without esophagitis: Secondary | ICD-10-CM | POA: Diagnosis not present

## 2014-11-10 DIAGNOSIS — S060X9A Concussion with loss of consciousness of unspecified duration, initial encounter: Secondary | ICD-10-CM | POA: Diagnosis not present

## 2014-11-10 DIAGNOSIS — R4020 Unspecified coma: Secondary | ICD-10-CM | POA: Diagnosis not present

## 2014-11-10 HISTORY — DX: Unspecified fall, initial encounter: W19.XXXA

## 2014-11-10 LAB — CBC
HCT: 43.6 % (ref 40.0–52.0)
HGB: 14.1 g/dL (ref 13.0–18.0)
MCH: 26.4 pg (ref 26.0–34.0)
MCHC: 32.3 g/dL (ref 32.0–36.0)
MCV: 82 fL (ref 80–100)
Platelet: 206 10*3/uL (ref 150–440)
RBC: 5.33 10*6/uL (ref 4.40–5.90)
RDW: 15.3 % — AB (ref 11.5–14.5)
WBC: 5.8 10*3/uL (ref 3.8–10.6)

## 2014-11-10 LAB — BASIC METABOLIC PANEL
ANION GAP: 9 (ref 7–16)
BUN: 13 mg/dL (ref 7–18)
CALCIUM: 8.5 mg/dL (ref 8.5–10.1)
Chloride: 104 mmol/L (ref 98–107)
Co2: 24 mmol/L (ref 21–32)
Creatinine: 1.01 mg/dL (ref 0.60–1.30)
EGFR (Non-African Amer.): >60
Glucose: 154 mg/dL — ABNORMAL HIGH (ref 65–99)
OSMOLALITY: 277 (ref 275–301)
Potassium: 3.9 mmol/L (ref 3.5–5.1)
Sodium: 137 mmol/L (ref 136–145)

## 2014-11-10 LAB — TROPONIN I: Troponin-I: <0.02 ng/mL

## 2014-11-11 DIAGNOSIS — I10 Essential (primary) hypertension: Secondary | ICD-10-CM | POA: Diagnosis not present

## 2014-11-11 DIAGNOSIS — E119 Type 2 diabetes mellitus without complications: Secondary | ICD-10-CM | POA: Diagnosis not present

## 2014-11-11 DIAGNOSIS — R55 Syncope and collapse: Secondary | ICD-10-CM | POA: Diagnosis not present

## 2014-11-11 DIAGNOSIS — K219 Gastro-esophageal reflux disease without esophagitis: Secondary | ICD-10-CM | POA: Diagnosis not present

## 2014-11-11 LAB — URINALYSIS, COMPLETE
BLOOD: NEGATIVE
Bacteria: NONE SEEN
Bilirubin,UR: NEGATIVE
Glucose,UR: NEGATIVE mg/dL (ref 0–75)
Ketone: NEGATIVE
Leukocyte Esterase: NEGATIVE
Nitrite: NEGATIVE
Ph: 6 (ref 4.5–8.0)
Protein: NEGATIVE
RBC,UR: 1 /HPF (ref 0–5)
SPECIFIC GRAVITY: 1.015 (ref 1.003–1.030)
Squamous Epithelial: NONE SEEN
WBC UR: <1 /HPF (ref 0–5)

## 2014-11-11 LAB — TROPONIN I
Troponin-I: <0.02 ng/mL
Troponin-I: 0.03 ng/mL

## 2014-11-12 ENCOUNTER — Inpatient Hospital Stay: Payer: Self-pay | Admitting: Internal Medicine

## 2014-11-12 DIAGNOSIS — Z882 Allergy status to sulfonamides status: Secondary | ICD-10-CM | POA: Diagnosis not present

## 2014-11-12 DIAGNOSIS — K219 Gastro-esophageal reflux disease without esophagitis: Secondary | ICD-10-CM | POA: Diagnosis present

## 2014-11-12 DIAGNOSIS — I10 Essential (primary) hypertension: Secondary | ICD-10-CM | POA: Diagnosis not present

## 2014-11-12 DIAGNOSIS — F419 Anxiety disorder, unspecified: Secondary | ICD-10-CM | POA: Diagnosis not present

## 2014-11-12 DIAGNOSIS — S0003XA Contusion of scalp, initial encounter: Secondary | ICD-10-CM | POA: Diagnosis present

## 2014-11-12 DIAGNOSIS — G629 Polyneuropathy, unspecified: Secondary | ICD-10-CM | POA: Diagnosis not present

## 2014-11-12 DIAGNOSIS — I454 Nonspecific intraventricular block: Secondary | ICD-10-CM | POA: Diagnosis present

## 2014-11-12 DIAGNOSIS — H8110 Benign paroxysmal vertigo, unspecified ear: Secondary | ICD-10-CM | POA: Diagnosis not present

## 2014-11-12 DIAGNOSIS — Z950 Presence of cardiac pacemaker: Secondary | ICD-10-CM | POA: Diagnosis not present

## 2014-11-12 DIAGNOSIS — Z87891 Personal history of nicotine dependence: Secondary | ICD-10-CM | POA: Diagnosis not present

## 2014-11-12 DIAGNOSIS — E119 Type 2 diabetes mellitus without complications: Secondary | ICD-10-CM | POA: Diagnosis present

## 2014-11-12 DIAGNOSIS — R42 Dizziness and giddiness: Secondary | ICD-10-CM | POA: Diagnosis present

## 2014-11-12 DIAGNOSIS — T43295A Adverse effect of other antidepressants, initial encounter: Secondary | ICD-10-CM | POA: Diagnosis not present

## 2014-11-12 DIAGNOSIS — R569 Unspecified convulsions: Secondary | ICD-10-CM | POA: Diagnosis not present

## 2014-11-12 DIAGNOSIS — R55 Syncope and collapse: Secondary | ICD-10-CM | POA: Diagnosis present

## 2014-11-12 LAB — TSH: Thyroid Stimulating Horm: 1.91 u[IU]/mL

## 2014-11-12 LAB — FOLATE: FOLIC ACID: 32.8 ng/mL (ref 3.1–100.0)

## 2014-11-13 DIAGNOSIS — E119 Type 2 diabetes mellitus without complications: Secondary | ICD-10-CM | POA: Diagnosis not present

## 2014-11-13 DIAGNOSIS — R55 Syncope and collapse: Secondary | ICD-10-CM | POA: Diagnosis not present

## 2014-11-13 DIAGNOSIS — I10 Essential (primary) hypertension: Secondary | ICD-10-CM | POA: Diagnosis not present

## 2014-11-13 DIAGNOSIS — K219 Gastro-esophageal reflux disease without esophagitis: Secondary | ICD-10-CM | POA: Diagnosis not present

## 2014-11-15 ENCOUNTER — Ambulatory Visit: Payer: Self-pay | Admitting: Family Medicine

## 2014-11-15 DIAGNOSIS — R569 Unspecified convulsions: Secondary | ICD-10-CM | POA: Diagnosis not present

## 2014-11-23 ENCOUNTER — Encounter: Payer: Self-pay | Admitting: Family Medicine

## 2014-11-23 DIAGNOSIS — R262 Difficulty in walking, not elsewhere classified: Secondary | ICD-10-CM | POA: Diagnosis not present

## 2014-11-23 DIAGNOSIS — R42 Dizziness and giddiness: Secondary | ICD-10-CM | POA: Diagnosis not present

## 2014-11-28 DIAGNOSIS — R42 Dizziness and giddiness: Secondary | ICD-10-CM | POA: Diagnosis not present

## 2014-11-28 DIAGNOSIS — R2689 Other abnormalities of gait and mobility: Secondary | ICD-10-CM | POA: Diagnosis not present

## 2014-12-01 ENCOUNTER — Encounter (HOSPITAL_COMMUNITY): Payer: Self-pay | Admitting: Cardiology

## 2014-12-13 DIAGNOSIS — D0439 Carcinoma in situ of skin of other parts of face: Secondary | ICD-10-CM | POA: Diagnosis not present

## 2014-12-13 DIAGNOSIS — D044 Carcinoma in situ of skin of scalp and neck: Secondary | ICD-10-CM | POA: Diagnosis not present

## 2014-12-13 DIAGNOSIS — D485 Neoplasm of uncertain behavior of skin: Secondary | ICD-10-CM | POA: Diagnosis not present

## 2014-12-14 DIAGNOSIS — Z9889 Other specified postprocedural states: Secondary | ICD-10-CM | POA: Diagnosis not present

## 2014-12-14 DIAGNOSIS — R42 Dizziness and giddiness: Secondary | ICD-10-CM | POA: Diagnosis not present

## 2014-12-14 DIAGNOSIS — R55 Syncope and collapse: Secondary | ICD-10-CM | POA: Diagnosis not present

## 2014-12-23 ENCOUNTER — Encounter: Payer: Self-pay | Admitting: Family Medicine

## 2014-12-23 DIAGNOSIS — R262 Difficulty in walking, not elsewhere classified: Secondary | ICD-10-CM | POA: Diagnosis not present

## 2014-12-23 DIAGNOSIS — F42 Obsessive-compulsive disorder: Secondary | ICD-10-CM | POA: Diagnosis not present

## 2014-12-23 DIAGNOSIS — C3490 Malignant neoplasm of unspecified part of unspecified bronchus or lung: Secondary | ICD-10-CM

## 2014-12-23 HISTORY — PX: LUNG BIOPSY: SHX232

## 2014-12-23 HISTORY — DX: Malignant neoplasm of unspecified part of unspecified bronchus or lung: C34.90

## 2014-12-24 ENCOUNTER — Other Ambulatory Visit: Payer: Self-pay | Admitting: Internal Medicine

## 2014-12-26 DIAGNOSIS — R05 Cough: Secondary | ICD-10-CM | POA: Diagnosis not present

## 2014-12-26 DIAGNOSIS — F42 Obsessive-compulsive disorder: Secondary | ICD-10-CM | POA: Diagnosis not present

## 2014-12-26 DIAGNOSIS — J069 Acute upper respiratory infection, unspecified: Secondary | ICD-10-CM | POA: Diagnosis not present

## 2014-12-26 DIAGNOSIS — R262 Difficulty in walking, not elsewhere classified: Secondary | ICD-10-CM | POA: Diagnosis not present

## 2014-12-27 ENCOUNTER — Ambulatory Visit (INDEPENDENT_AMBULATORY_CARE_PROVIDER_SITE_OTHER): Payer: Medicare Other | Admitting: *Deleted

## 2014-12-27 DIAGNOSIS — I442 Atrioventricular block, complete: Secondary | ICD-10-CM

## 2014-12-27 NOTE — Progress Notes (Signed)
Remote pacemaker transmission.   

## 2014-12-28 DIAGNOSIS — R05 Cough: Secondary | ICD-10-CM | POA: Diagnosis not present

## 2014-12-28 DIAGNOSIS — J019 Acute sinusitis, unspecified: Secondary | ICD-10-CM | POA: Diagnosis not present

## 2014-12-29 LAB — MDC_IDC_ENUM_SESS_TYPE_REMOTE
Battery Remaining Percentage: 67 %
Battery Voltage: 2.93 V
Brady Statistic AP VS Percent: 1 %
Brady Statistic AS VP Percent: 95 %
Brady Statistic AS VS Percent: 1 %
Brady Statistic RA Percent Paced: 4.3 %
Brady Statistic RV Percent Paced: 99 %
Date Time Interrogation Session: 20160105151357
Implantable Pulse Generator Model: 2210
Lead Channel Impedance Value: 400 Ohm
Lead Channel Pacing Threshold Amplitude: 1 V
Lead Channel Pacing Threshold Pulse Width: 0.8 ms
Lead Channel Sensing Intrinsic Amplitude: 3.6 mV
Lead Channel Setting Pacing Amplitude: 2.5 V
Lead Channel Setting Pacing Pulse Width: 0.8 ms
Lead Channel Setting Sensing Sensitivity: 4 mV
MDC IDC MSMT BATTERY REMAINING LONGEVITY: 60 mo
MDC IDC MSMT LEADCHNL RA PACING THRESHOLD PULSEWIDTH: 0.4 ms
MDC IDC MSMT LEADCHNL RV IMPEDANCE VALUE: 350 Ohm
MDC IDC MSMT LEADCHNL RV PACING THRESHOLD AMPLITUDE: 0.75 V
MDC IDC MSMT LEADCHNL RV SENSING INTR AMPL: 12 mV
MDC IDC PG SERIAL: 7196739
MDC IDC SET LEADCHNL RA PACING AMPLITUDE: 2 V
MDC IDC STAT BRADY AP VP PERCENT: 4.4 %

## 2015-01-05 DIAGNOSIS — D492 Neoplasm of unspecified behavior of bone, soft tissue, and skin: Secondary | ICD-10-CM | POA: Diagnosis not present

## 2015-01-05 DIAGNOSIS — D0439 Carcinoma in situ of skin of other parts of face: Secondary | ICD-10-CM | POA: Diagnosis not present

## 2015-01-05 DIAGNOSIS — L570 Actinic keratosis: Secondary | ICD-10-CM | POA: Diagnosis not present

## 2015-01-05 DIAGNOSIS — Z85828 Personal history of other malignant neoplasm of skin: Secondary | ICD-10-CM | POA: Diagnosis not present

## 2015-01-06 ENCOUNTER — Encounter: Payer: Self-pay | Admitting: Cardiology

## 2015-01-09 DIAGNOSIS — F42 Obsessive-compulsive disorder: Secondary | ICD-10-CM | POA: Diagnosis not present

## 2015-01-09 DIAGNOSIS — R262 Difficulty in walking, not elsewhere classified: Secondary | ICD-10-CM | POA: Diagnosis not present

## 2015-01-10 DIAGNOSIS — Z950 Presence of cardiac pacemaker: Secondary | ICD-10-CM | POA: Diagnosis not present

## 2015-01-10 DIAGNOSIS — M6530 Trigger finger, unspecified finger: Secondary | ICD-10-CM | POA: Diagnosis not present

## 2015-01-10 DIAGNOSIS — I10 Essential (primary) hypertension: Secondary | ICD-10-CM | POA: Diagnosis not present

## 2015-01-10 DIAGNOSIS — I442 Atrioventricular block, complete: Secondary | ICD-10-CM | POA: Diagnosis not present

## 2015-01-10 DIAGNOSIS — Z96653 Presence of artificial knee joint, bilateral: Secondary | ICD-10-CM | POA: Diagnosis not present

## 2015-01-10 DIAGNOSIS — K219 Gastro-esophageal reflux disease without esophagitis: Secondary | ICD-10-CM | POA: Diagnosis not present

## 2015-01-10 DIAGNOSIS — G4730 Sleep apnea, unspecified: Secondary | ICD-10-CM | POA: Diagnosis not present

## 2015-01-11 ENCOUNTER — Encounter: Payer: Self-pay | Admitting: Internal Medicine

## 2015-01-23 ENCOUNTER — Encounter: Payer: Self-pay | Admitting: Family Medicine

## 2015-01-23 DIAGNOSIS — R55 Syncope and collapse: Secondary | ICD-10-CM | POA: Diagnosis not present

## 2015-01-23 DIAGNOSIS — R262 Difficulty in walking, not elsewhere classified: Secondary | ICD-10-CM | POA: Diagnosis not present

## 2015-01-23 DIAGNOSIS — F42 Obsessive-compulsive disorder: Secondary | ICD-10-CM | POA: Diagnosis not present

## 2015-01-23 DIAGNOSIS — R42 Dizziness and giddiness: Secondary | ICD-10-CM | POA: Diagnosis not present

## 2015-01-23 DIAGNOSIS — Z9889 Other specified postprocedural states: Secondary | ICD-10-CM | POA: Diagnosis not present

## 2015-01-24 DIAGNOSIS — M65352 Trigger finger, left little finger: Secondary | ICD-10-CM | POA: Diagnosis not present

## 2015-01-24 DIAGNOSIS — I10 Essential (primary) hypertension: Secondary | ICD-10-CM | POA: Diagnosis not present

## 2015-01-24 DIAGNOSIS — R269 Unspecified abnormalities of gait and mobility: Secondary | ICD-10-CM | POA: Diagnosis not present

## 2015-01-24 DIAGNOSIS — R42 Dizziness and giddiness: Secondary | ICD-10-CM | POA: Diagnosis not present

## 2015-01-24 DIAGNOSIS — Z7982 Long term (current) use of aspirin: Secondary | ICD-10-CM | POA: Diagnosis not present

## 2015-01-24 DIAGNOSIS — I499 Cardiac arrhythmia, unspecified: Secondary | ICD-10-CM | POA: Diagnosis not present

## 2015-01-24 DIAGNOSIS — K219 Gastro-esophageal reflux disease without esophagitis: Secondary | ICD-10-CM | POA: Diagnosis not present

## 2015-01-24 DIAGNOSIS — E119 Type 2 diabetes mellitus without complications: Secondary | ICD-10-CM | POA: Diagnosis not present

## 2015-01-24 DIAGNOSIS — Z950 Presence of cardiac pacemaker: Secondary | ICD-10-CM | POA: Diagnosis not present

## 2015-01-24 DIAGNOSIS — I442 Atrioventricular block, complete: Secondary | ICD-10-CM | POA: Diagnosis not present

## 2015-01-24 DIAGNOSIS — M659 Synovitis and tenosynovitis, unspecified: Secondary | ICD-10-CM | POA: Diagnosis not present

## 2015-01-24 DIAGNOSIS — E785 Hyperlipidemia, unspecified: Secondary | ICD-10-CM | POA: Diagnosis not present

## 2015-01-24 DIAGNOSIS — Z79899 Other long term (current) drug therapy: Secondary | ICD-10-CM | POA: Diagnosis not present

## 2015-01-24 DIAGNOSIS — G4730 Sleep apnea, unspecified: Secondary | ICD-10-CM | POA: Diagnosis not present

## 2015-01-24 HISTORY — PX: TRIGGER FINGER RELEASE: SHX641

## 2015-01-27 DIAGNOSIS — G5601 Carpal tunnel syndrome, right upper limb: Secondary | ICD-10-CM | POA: Diagnosis not present

## 2015-01-27 DIAGNOSIS — Z79899 Other long term (current) drug therapy: Secondary | ICD-10-CM | POA: Diagnosis not present

## 2015-01-30 DIAGNOSIS — R262 Difficulty in walking, not elsewhere classified: Secondary | ICD-10-CM | POA: Diagnosis not present

## 2015-01-30 DIAGNOSIS — F42 Obsessive-compulsive disorder: Secondary | ICD-10-CM | POA: Diagnosis not present

## 2015-02-01 DIAGNOSIS — H903 Sensorineural hearing loss, bilateral: Secondary | ICD-10-CM | POA: Diagnosis not present

## 2015-02-10 DIAGNOSIS — R262 Difficulty in walking, not elsewhere classified: Secondary | ICD-10-CM | POA: Diagnosis not present

## 2015-02-10 DIAGNOSIS — F42 Obsessive-compulsive disorder: Secondary | ICD-10-CM | POA: Diagnosis not present

## 2015-02-13 DIAGNOSIS — L570 Actinic keratosis: Secondary | ICD-10-CM | POA: Diagnosis not present

## 2015-02-13 DIAGNOSIS — C44222 Squamous cell carcinoma of skin of right ear and external auricular canal: Secondary | ICD-10-CM | POA: Diagnosis not present

## 2015-02-13 DIAGNOSIS — L219 Seborrheic dermatitis, unspecified: Secondary | ICD-10-CM | POA: Diagnosis not present

## 2015-02-13 DIAGNOSIS — Z681 Body mass index (BMI) 19 or less, adult: Secondary | ICD-10-CM | POA: Diagnosis not present

## 2015-02-15 DIAGNOSIS — F42 Obsessive-compulsive disorder: Secondary | ICD-10-CM | POA: Diagnosis not present

## 2015-02-15 DIAGNOSIS — R262 Difficulty in walking, not elsewhere classified: Secondary | ICD-10-CM | POA: Diagnosis not present

## 2015-02-17 DIAGNOSIS — R262 Difficulty in walking, not elsewhere classified: Secondary | ICD-10-CM | POA: Diagnosis not present

## 2015-02-17 DIAGNOSIS — F42 Obsessive-compulsive disorder: Secondary | ICD-10-CM | POA: Diagnosis not present

## 2015-02-21 ENCOUNTER — Encounter
Admit: 2015-02-21 | Disposition: A | Discharge status: Home / Self Care | Payer: Self-pay | Attending: Family Medicine | Admitting: Family Medicine

## 2015-02-28 DIAGNOSIS — H60332 Swimmer's ear, left ear: Secondary | ICD-10-CM | POA: Diagnosis not present

## 2015-02-28 DIAGNOSIS — M47812 Spondylosis without myelopathy or radiculopathy, cervical region: Secondary | ICD-10-CM | POA: Diagnosis not present

## 2015-02-28 DIAGNOSIS — H9190 Unspecified hearing loss, unspecified ear: Secondary | ICD-10-CM | POA: Diagnosis not present

## 2015-03-16 DIAGNOSIS — H903 Sensorineural hearing loss, bilateral: Secondary | ICD-10-CM | POA: Diagnosis not present

## 2015-03-16 DIAGNOSIS — H60339 Swimmer's ear, unspecified ear: Secondary | ICD-10-CM | POA: Diagnosis not present

## 2015-03-16 DIAGNOSIS — H9190 Unspecified hearing loss, unspecified ear: Secondary | ICD-10-CM | POA: Diagnosis not present

## 2015-03-27 ENCOUNTER — Ambulatory Visit (INDEPENDENT_AMBULATORY_CARE_PROVIDER_SITE_OTHER): Payer: Medicare Other | Admitting: Cardiovascular Disease

## 2015-03-27 ENCOUNTER — Encounter: Payer: Medicare Other | Admitting: *Deleted

## 2015-03-27 ENCOUNTER — Encounter: Payer: Self-pay | Admitting: Cardiovascular Disease

## 2015-03-27 VITALS — BP 124/62 | HR 82 | Ht 69.0 in | Wt 192.8 lb

## 2015-03-27 DIAGNOSIS — I429 Cardiomyopathy, unspecified: Secondary | ICD-10-CM

## 2015-03-27 DIAGNOSIS — I428 Other cardiomyopathies: Secondary | ICD-10-CM

## 2015-03-27 DIAGNOSIS — R079 Chest pain, unspecified: Secondary | ICD-10-CM | POA: Diagnosis not present

## 2015-03-27 DIAGNOSIS — E119 Type 2 diabetes mellitus without complications: Secondary | ICD-10-CM

## 2015-03-27 DIAGNOSIS — R42 Dizziness and giddiness: Secondary | ICD-10-CM

## 2015-03-27 DIAGNOSIS — I10 Essential (primary) hypertension: Secondary | ICD-10-CM

## 2015-03-27 DIAGNOSIS — I442 Atrioventricular block, complete: Secondary | ICD-10-CM

## 2015-03-27 DIAGNOSIS — E785 Hyperlipidemia, unspecified: Secondary | ICD-10-CM

## 2015-03-27 DIAGNOSIS — I420 Dilated cardiomyopathy: Secondary | ICD-10-CM | POA: Diagnosis not present

## 2015-03-27 NOTE — Progress Notes (Signed)
Patient ID: Howard [name], male    DOB: 01-30-1931, 81 y.o.   MRN: [id number]  HPI Comments: Howard [name] is a 81 year old-year-old gentleman, patient of [facility], with a hx of complete heart block, status post pacemaker in 2011, nonischemic cardiomyopathy, 70% distal RCA disease, 30 and 40% lesions in his ramus, LAD, with recent admission to the hospital 09/05/2012 with discharge 09/09/2014 with acute cholecystitis, ileus. He presents for follow-up of his CAD  In follow-up, he reports that he is doing well. He goes to [facility] several days a week. Blood pressure typically in the 120 range He is scheduled for hand surgery in the near future. He has had several surgeries on his hands. He does report having one recent surgery at [facility] or [facility]. Postoperatively some issue of a problem with his pacemaker. Dr. Rayann Heman was contacted and it was recommended a device rep interrogate his device. This did not happen as the rep was not available and time. He is scheduled to have more surgery next week on April 03 2104. He is concerned again about his device Recent vertigo February 2016  Previous lab work discussed with him, July 2015 showing total cholesterol 170, LDL 78 Hemoglobin A1c typically 7.5 EKG on today's visit showing paced rhythm with rate 82 bpm  Other past medical history In early September 2015 he was seen in the [place] office with symptoms of chest pain and shortness of breath,  he had a Abnormal Myoview  cardiac catheterization showing details above Echocardiogram showing ejection fraction 35-40%  After discharge she continued to have similar symptoms of epigastric discomfort radiating through to his back Admitted to the hospital for cholecystectomy  Allergies  Allergen Reactions  . Sulfonamide Derivatives Rash    Outpatient Encounter Prescriptions as of 03/27/2015  Medication Sig  . amLODipine (NORVASC) 5 MG tablet Take 1 tablet (5 mg total) by mouth daily.  Marland Kitchen  aspirin 81 MG tablet Take 81 mg by mouth daily.    Marland Kitchen buPROPion (WELLBUTRIN XL) 300 MG 24 hr tablet Take 300 mg by mouth daily.  . Cholecalciferol (VITAMIN D3) 1000 UNITS CAPS Take 1,000 Units by mouth daily.   . Colesevelam HCl (WELCHOL) 3.75 G PACK Take 3.75 g by mouth daily. Mix in [place] of water and drink.  . hydrochlorothiazide 25 MG tablet Take 1 tablet (25 mg total) by mouth daily.  Marland Kitchen lisinopril (PRINIVIL,ZESTRIL) 10 MG tablet Take 1 tablet (10 mg total) by mouth daily.  . metFORMIN (GLUCOPHAGE) 500 MG tablet Take 1 tablet by mouth daily.  . metoprolol succinate (TOPROL-XL) 25 MG 24 hr tablet Take 25 mg by mouth every evening.  . Multiple Vitamin (MULTIVITAMIN) capsule Take 1 capsule by mouth daily.    Marland Kitchen omeprazole (PRILOSEC) 40 MG capsule Take 40 mg by mouth 2 (two) times daily.  . simvastatin (ZOCOR) 40 MG tablet Take 1 tablet (40 mg total) by mouth at bedtime.  . [DISCONTINUED] metoprolol succinate (TOPROL-XL) 25 MG 24 hr tablet TAKE 1 TABLET DAILY (Patient not taking: Reported on 03/27/2015)    Past Medical History  Diagnosis Date  . Hyperlipidemia   . Hypertension   . Complete heart block     a. 11/2010 s/p SJM 2210 [facility] DC PPM, ser# [id number].  . WPW (Wolff-Parkinson-White syndrome)     a. S/P RFCA 1991.  . Sleep apnea     a. cpap  . Diabetes mellitus without complication   . LBBB (left bundle branch block)   . GERD (gastroesophageal  reflux disease)   . Arthritis   . Cancer     prostate and skin  . NICM (nonischemic cardiomyopathy)     a. 07/2014 Echo: EF 35-40%, mid-apicalanteroseptal DK, Gr 1 DD, mild-mod dil LA.  Marland Kitchen Chronic combined systolic and diastolic CHF, NYHA class 1     a. 07/2014 Echo: EF 35-40%, Gr 1 DD.  Marland Kitchen Vertigo   . Depression   . Non-obstructive CAD     a. 07/2014 Abnl MV;  b. 08/2014 Cath: LM nl, LAD 30p, RI 40p, LCX nl, OM1 40, RCA dominant 30p, 70d-->Med Rx.    Past Surgical History  Procedure Laterality Date  . Ruptured disc      1962 and 1998  .  Cervical fusion    . Knee surgery      left knee 1991 and 1992; right knee 1995  . Prostate surgery      cancer--1998  . Replacement total knee      2004  . Hand surgery      right 1993; left 2005  . Back surgery      2011  . Pacemaker insertion      PPM-- [employer] 11/30/10 by [name]  . Catheter ablation  1991    for WPW  . Joint replacement Left 2013    knee  . Joint replacement Right 2004    knee  . Lumbar laminectomy/decompression microdiscectomy N/A 06/07/2014    Procedure: LUMBAR FOUR TO FIVE LUMBAR LAMINECTOMY/DECOMPRESSION MICRODISCECTOMY 1 LEVEL;  Surgeon: [name], MD;  Location: [facility];  Service: Neurosurgery;  Laterality: N/A;  . Insert / replace / remove pacemaker    . Cholecystectomy    . Cardiac catheterization  08/26/2014    Single vessel obstructive CAD  . Left heart catheterization with coronary angiogram N/A 08/26/2014    Procedure: LEFT HEART CATHETERIZATION WITH CORONARY ANGIOGRAM;  Surgeon: [name], MD;  Location: [facility] C[facility];  Service: Cardiovascular;  Laterality: N/A;    Social History  reports that he has quit smoking. He does not have any smokeless tobacco history on file. He reports that he drinks alcohol. He reports that he does not use illicit drugs.  Family History family history includes CAD in an other family member.   Review of Systems  Constitutional: Negative.   Respiratory: Negative.   Cardiovascular: Negative.   Gastrointestinal: Negative.   Musculoskeletal: Negative.   Neurological: Negative.   Hematological: Negative.   Psychiatric/Behavioral: Negative.   All other systems reviewed and are negative.   BP 124/62 mmHg  Pulse 82  Ht 5\' 9"  (1.753 m)  Wt 192 lb 12 oz (87.431 kg)  BMI 28.45 kg/m2  Physical Exam  Constitutional: He is oriented to person, place, and time. He appears well-developed and well-nourished.  HENT:  Head: Normocephalic.  Nose: Nose normal.  Mouth/Throat: Oropharynx is clear and moist.  Eyes:  Conjunctivae are normal. Pupils are equal, round, and reactive to light.  Neck: Normal range of motion. Neck supple. No JVD present.  Cardiovascular: Normal rate, regular rhythm, S1 normal, S2 normal, normal heart sounds and intact distal pulses.  Exam reveals no gallop and no friction rub.   No murmur heard. Pulmonary/Chest: Effort normal and breath sounds normal. No respiratory distress. He has no wheezes. He has no rales. He exhibits no tenderness.  Abdominal: Soft. Bowel sounds are normal. He exhibits no distension. There is no tenderness.  Musculoskeletal: Normal range of motion. He exhibits no edema or tenderness.  Lymphadenopathy:  He has no cervical adenopathy.  Neurological: He is alert and oriented to person, place, and time. Coordination normal.  Skin: Skin is warm and dry. No rash noted. No erythema.  Psychiatric: He has a normal mood and affect. His behavior is normal. Judgment and thought content normal.      Assessment and Plan   Nursing note and vitals reviewed.

## 2015-03-27 NOTE — Assessment & Plan Note (Signed)
We have encouraged continued exercise, careful diet management in an effort to lose weight. Sedentary at baseline

## 2015-03-27 NOTE — Assessment & Plan Note (Signed)
He is tolerating simvastatin daily. No further medication titration needed

## 2015-03-27 NOTE — Assessment & Plan Note (Signed)
Repeat echocardiogram has been ordered to evaluate his ejection fraction. Previously 35-40%

## 2015-03-27 NOTE — Assessment & Plan Note (Signed)
Blood pressure is well controlled on today's visit. No changes made to the medications. 

## 2015-03-27 NOTE — Assessment & Plan Note (Signed)
Pacemaker monitored by EP

## 2015-03-27 NOTE — Assessment & Plan Note (Signed)
Chronic issue, prior workup. Stable at this time

## 2015-03-27 NOTE — Patient Instructions (Addendum)
You are doing well. No medication changes were made.  We will order an echocardiogram for cardiomyopathy  Please call us if you have new issues that need to be addressed before your next appt.  Your physician wants you to follow-up in: 6 months.  You will receive a reminder letter in the mail two months in advance. If you don't receive a letter, please call our office to schedule the follow-up appointment.

## 2015-03-28 DIAGNOSIS — E785 Hyperlipidemia, unspecified: Secondary | ICD-10-CM | POA: Diagnosis not present

## 2015-03-28 DIAGNOSIS — I442 Atrioventricular block, complete: Secondary | ICD-10-CM | POA: Diagnosis not present

## 2015-03-28 DIAGNOSIS — G4730 Sleep apnea, unspecified: Secondary | ICD-10-CM | POA: Diagnosis not present

## 2015-03-28 DIAGNOSIS — I10 Essential (primary) hypertension: Secondary | ICD-10-CM | POA: Diagnosis not present

## 2015-03-28 DIAGNOSIS — Z950 Presence of cardiac pacemaker: Secondary | ICD-10-CM | POA: Diagnosis not present

## 2015-03-28 DIAGNOSIS — K219 Gastro-esophageal reflux disease without esophagitis: Secondary | ICD-10-CM | POA: Diagnosis not present

## 2015-03-28 DIAGNOSIS — G5601 Carpal tunnel syndrome, right upper limb: Secondary | ICD-10-CM | POA: Diagnosis not present

## 2015-03-28 DIAGNOSIS — I498 Other specified cardiac arrhythmias: Secondary | ICD-10-CM | POA: Diagnosis not present

## 2015-03-30 ENCOUNTER — Ambulatory Visit: Payer: Medicare Other

## 2015-03-30 ENCOUNTER — Ambulatory Visit (INDEPENDENT_AMBULATORY_CARE_PROVIDER_SITE_OTHER): Payer: Medicare Other | Admitting: *Deleted

## 2015-03-30 DIAGNOSIS — I442 Atrioventricular block, complete: Secondary | ICD-10-CM

## 2015-03-30 LAB — MDC_IDC_ENUM_SESS_TYPE_REMOTE
Battery Remaining Longevity: 54 mo
Battery Remaining Percentage: 56 %
Brady Statistic AP VP Percent: 7 %
Brady Statistic AP VS Percent: 1 %
Brady Statistic AS VP Percent: 91 %
Brady Statistic AS VS Percent: 1.4 %
Brady Statistic RV Percent Paced: 98 %
Lead Channel Impedance Value: 430 Ohm
Lead Channel Sensing Intrinsic Amplitude: 4.1 mV
Lead Channel Setting Pacing Amplitude: 2 V
Lead Channel Setting Pacing Amplitude: 2.5 V
Lead Channel Setting Pacing Pulse Width: 0.8 ms
MDC IDC MSMT BATTERY VOLTAGE: 2.92 V
MDC IDC MSMT LEADCHNL RA IMPEDANCE VALUE: 450 Ohm
MDC IDC PG SERIAL: 7196739
MDC IDC SESS DTM: 20160407060008
MDC IDC SET LEADCHNL RV SENSING SENSITIVITY: 4 mV
MDC IDC STAT BRADY RA PERCENT PACED: 6.8 %

## 2015-04-03 NOTE — Progress Notes (Signed)
Remote pacemaker transmission.   

## 2015-04-04 DIAGNOSIS — I10 Essential (primary) hypertension: Secondary | ICD-10-CM | POA: Diagnosis not present

## 2015-04-04 DIAGNOSIS — G5601 Carpal tunnel syndrome, right upper limb: Secondary | ICD-10-CM | POA: Diagnosis not present

## 2015-04-04 DIAGNOSIS — Z87891 Personal history of nicotine dependence: Secondary | ICD-10-CM | POA: Diagnosis not present

## 2015-04-04 DIAGNOSIS — Z981 Arthrodesis status: Secondary | ICD-10-CM | POA: Diagnosis not present

## 2015-04-04 DIAGNOSIS — E119 Type 2 diabetes mellitus without complications: Secondary | ICD-10-CM | POA: Diagnosis not present

## 2015-04-04 DIAGNOSIS — Z96653 Presence of artificial knee joint, bilateral: Secondary | ICD-10-CM | POA: Diagnosis not present

## 2015-04-04 DIAGNOSIS — Z950 Presence of cardiac pacemaker: Secondary | ICD-10-CM | POA: Diagnosis not present

## 2015-04-04 DIAGNOSIS — G4730 Sleep apnea, unspecified: Secondary | ICD-10-CM | POA: Diagnosis not present

## 2015-04-04 DIAGNOSIS — K219 Gastro-esophageal reflux disease without esophagitis: Secondary | ICD-10-CM | POA: Diagnosis not present

## 2015-04-04 DIAGNOSIS — Z79899 Other long term (current) drug therapy: Secondary | ICD-10-CM | POA: Diagnosis not present

## 2015-04-04 HISTORY — PX: CARPAL TUNNEL RELEASE: SHX101

## 2015-04-12 ENCOUNTER — Emergency Department
Admit: 2015-04-12 | Disposition: A | Discharge status: Home / Self Care | Payer: Self-pay | Admitting: Emergency Medicine

## 2015-04-12 DIAGNOSIS — G510 Bell's palsy: Secondary | ICD-10-CM

## 2015-04-12 DIAGNOSIS — R531 Weakness: Secondary | ICD-10-CM | POA: Diagnosis not present

## 2015-04-12 DIAGNOSIS — Z7982 Long term (current) use of aspirin: Secondary | ICD-10-CM | POA: Diagnosis not present

## 2015-04-12 DIAGNOSIS — I10 Essential (primary) hypertension: Secondary | ICD-10-CM | POA: Diagnosis not present

## 2015-04-12 DIAGNOSIS — E119 Type 2 diabetes mellitus without complications: Secondary | ICD-10-CM | POA: Diagnosis not present

## 2015-04-12 DIAGNOSIS — R29810 Facial weakness: Secondary | ICD-10-CM | POA: Diagnosis not present

## 2015-04-12 DIAGNOSIS — Z79899 Other long term (current) drug therapy: Secondary | ICD-10-CM | POA: Diagnosis not present

## 2015-04-12 HISTORY — DX: Bell's palsy: G51.0

## 2015-04-13 ENCOUNTER — Encounter: Payer: Self-pay | Admitting: Cardiology

## 2015-04-15 NOTE — Consult Note (Signed)
General Aspect PCP:  Lennox Grumbles, MD Primary Cardiologist:  J. Allred, MD _____________   81 y/o male with a h/o CHB s/p PPM in 2011, NICM s/p non-obs cath earlier this month, who presented to [facility] with back and epigastric pain and has been found to have cholelithiasis with plan for lap-cholecystectomy this hospitalization. _____________  Past Medical History ??? Hyperlipidemia  ??? Hypertension  ??? Complete heart block    a. 11/2010 s/p SJM 2210 [employer] DC PPM, ser# [id number]. ??? WPW (Wolff-Parkinson-White syndrome)    a. S/P RFCA 1991. ??? Sleep apnea    a. cpap ??? Diabetes mellitus without complication  ??? LBBB (left bundle branch block)  ??? GERD (gastroesophageal reflux disease)  ??? Arthritis  ??? Cancer    prostate and skin ??? Non-obstructive CAD    a. 07/2014 Abnl MV;  b. 08/2014 Cath: LM nl, LAD 30p, RI 40p, LCX nl, OM1 40, RCA dominant 30p, 70d-->Med Rx. ??? NICM (nonischemic cardiomyopathy)    a. 07/2014 Echo: EF 35-40%, mid-apicalanteroseptal DK, Gr 1 DD, mild-mod dil LA. ??? Chronic combined systolic and diastolic CHF, NYHA class 1    a. 07/2014 Echo: EF 35-40%, Gr 1 DD. ??? Vertigo  ??? Depression   Past Surgical History  ??? Ruptured disc     1962 and 1998 ??? Cervical fusion   ??? Knee surgery     left knee 1991 and 1992; right knee 1995 ??? Prostate surgery     cancer--1998 ??? Replacement total knee     2004 ??? Hand surgery     right 1993; left 2005 ??? Back surgery     2011 ??? Pacemaker insertion     PPM-- St Jude 11/30/10 by JA ??? Catheter ablation  1991   for WPW ??? Joint replacement Left 2013   knee ??? Joint replacement Right 2004   knee ??? Lumbar laminectomy/decompression microdiscectomy N/A 06/07/2014   Procedure: LUMBAR FOUR TO FIVE LUMBAR LAMINECTOMY/DECOMPRESSION MICRODISCECTOMY 1 LEVEL;  Surgeon: Temple Pacini, MD;  Location: [facility];  Service: Neurosurgery;  Laterality: N/A; _____________   Present Illness 81 y/o male with  the above complex problem list.   He was seen in our [place] office in August with complaints of c/p and dyspnea.  Stress testing was markedly abnl and showed new LV dysfxn.  Echo also showed new LV dysfxn, with an EF of 35-40%.  He subsequently underwent diagnostic cath on 9/4, revealing non-obstructive CAD.  In that setting, he was dx with NICM and medical therapy was recommended.  Currently he is only on a bb with a plan to start ACEI as an outpt.  Unfortunately, he has continued to have intermittent epigastric pain that radiates through to his back.  This recurred over the weekend and into Monday, prompting presentation to the ED.  There, ECG was non-acute (paced) and troponin was normal.  CTA of the chest, abd, pelvis did not show any evidence of dissection or PE, but did show multiple large GB stones with GB distention.  He was admitted by [facility] with plan for lap chole and we have been consulted for preop risk eval.  He denies dyspnea, pnd, orthopnea, early satiety, or wt change.  He continues to have pain in the epigatrum and mid-back.   Physical Exam:  GEN well developed, well nourished, no acute distress   HEENT pink conjunctivae, moist oral mucosa   NECK supple  no bruits/jvd.   RESP normal resp effort  clear BS   CARD Regular  rate and rhythm  Normal, S1, S2  No murmur   ABD positive tenderness  soft  normal BS  + epigastric tenderness.   EXTR negative cyanosis/clubbing, negative edema   SKIN normal to palpation   NEURO cranial nerves intact, motor/sensory function intact   PSYCH alert, A+O to time, place, person   Review of Systems:  Subjective/Chief Complaint ABD tenderness   General: No Complaints   Skin: No Complaints   ENT: No Complaints   Eyes: No Complaints   Neck: No Complaints   Respiratory: No Complaints   Cardiovascular: No Complaints   Gastrointestinal: epigatric pain.   Genitourinary: No Complaints   Vascular: No Complaints   Musculoskeletal: No  Complaints   Neurologic: No Complaints   Hematologic: No Complaints   Endocrine: No Complaints   Psychiatric: No Complaints   Review of Systems: All other systems were reviewed and found to be negative   Medications/Allergies Reviewed Medications/Allergies reviewed   Family & Social History:  Family and Social History:  Family History Negative  no premature CAD.   Social History negative tobacco, negative ETOH, negative Illicit drugs   Place of Living Home  in [place] with wife.       Anxiety:    GERD - Esophageal Reflux:    Diabetes:    Bundle Branch Block:    HTN:    Pacemaker:    Trigger Finge Surgery:    Prostatectomy:    Hand Surgery - Right:    Cardiac Ablation:    Knee Surgery - Right:    Knee Surgery - Left:    Back Surgery:    Hernia Repair:          Admit Diagnosis:   HYDROPS OF GALLBLADDER: Onset Date: 06-Sep-2014, Status: Active, Description: HYDROPS OF GALLBLADDER  Home Medications: Medication Instructions Status  hydrochlorothiazide 25 mg oral tablet 1 tab(s) orally once a day Active  omeprazole 40 mg oral delayed release capsule 1 cap(s) orally 2 times a day Active  amLODIPine 5 mg oral tablet 1 tab(s) orally once a day Active  buPROPion 300 mg/24 hours oral tablet, extended release 1 tab(s) orally every 24 hours Active  metoprolol 25 mg oral tablet 1 tab(s) orally once a day (at bedtime) Active  simvastatin 20 mg oral tablet 1 tab(s) orally once a day (at bedtime) Active  metFORMIN 500 mg oral tablet 1 tab(s) orally once a day Active  Welchol 3.75 g oral powder for reconstitution 1 each orally once a day Active  aspirin 81 mg oral tablet 1 tab(s) orally once a day (at bedtime) Active  One-A-Day Essentials Multiple Vitamins oral tablet 1 tab(s) orally once a day Active  Vitamin D3 1000 intl units oral capsule 1 cap(s) orally once a day Active   Lab Results:  Hepatic:  15-Sep-15 08:01   Bilirubin, Total 0.6  Alkaline  Phosphatase 67 (46-116 NOTE: New Reference Range 07/12/14)  SGPT (ALT) 22 (14-63 NOTE: New Reference Range 07/12/14)  SGOT (AST) 17  Total Protein, Serum 7.1  Albumin, Serum 3.4  Cardiology:  13-Sep-15 22:33   Ventricular Rate 75  Atrial Rate 75  P-R Interval 172  QRS Duration 200  QT 488  QTc 544  P Axis 31  R Axis 131  T Axis 94  ECG interpretation Statement Not Found (#297) Abnormal ECG When compared with ECG of 29-Jul-2014 07:50, Current undetermined rhythm precludes rhythm comparison, needs review ----------unconfirmed---------- Confirmed by OVERREAD, NOT ([id number]), editor PEARSON, BARBARA ([id number]) on 09/05/2014 9:13:38 AM  Routine  Chem:  15-Sep-15 08:01   Glucose, Serum  166  BUN 16  Creatinine (comp) 0.80  Sodium, Serum  130  Potassium, Serum 3.8  Chloride, Serum  97  CO2, Serum 27  Calcium (Total), Serum 9.0  Osmolality (calc) 266  eGFR (African American) >60  eGFR (Non-African American) >60 (eGFR values <37mL/min/1.73 m2 may be an indication of chronic kidney disease (CKD). Calculated eGFR is useful in patients with stable renal function. The eGFR calculation will not be reliable in acutely ill patients when serum creatinine is changing rapidly. It is not useful in  patients on dialysis. The eGFR calculation may not be applicable to patients at the low and high extremes of body sizes, pregnant women, and vegetarians.)  Anion Gap  6  Cardiac:  13-Sep-15 22:41   Troponin I < 0.02 (0.00-0.05 0.05 ng/mL or less: NEGATIVE  Repeat testing in 3-6 hrs  if clinically indicated. >0.05 ng/mL: POTENTIAL  MYOCARDIAL INJURY. Repeat  testing in 3-6 hrs if  clinically indicated. NOTE: An increase or decrease  of 30% or more on serial  testing suggests a  clinically important change)  Routine UA:  14-Sep-15 01:00   Color (UA) Yellow  Clarity (UA) Clear  Glucose (UA) 150 mg/dL  Bilirubin (UA) Negative  Ketones (UA) Trace  Specific Gravity (UA) 1.018  Blood (UA)  Negative  pH (UA) 5.0  Protein (UA) 30 mg/dL  Nitrite (UA) Negative  Leukocyte Esterase (UA) Negative (Result(s) reported on 05 Sep 2014 at 01:34AM.)  RBC (UA) 1 /HPF  WBC (UA) NONE SEEN  Bacteria (UA) NONE SEEN  Epithelial Cells (UA) NONE SEEN  Mucous (UA) PRESENT  Hyaline Cast (UA) 1 /LPF (Result(s) reported on 05 Sep 2014 at 01:34AM.)  Routine Hem:  15-Sep-15 08:01   WBC (CBC)  11.9  RBC (CBC) 5.37  Hemoglobin (CBC) 14.1  Hematocrit (CBC) 43.4  Platelet Count (CBC) 204  MCV 81  MCH 26.3  MCHC 32.5  RDW  14.8  Neutrophil % 77.7  Lymphocyte % 10.3  Monocyte % 10.7  Eosinophil % 0.9  Basophil % 0.4  Neutrophil #  9.2  Lymphocyte # 1.2  Monocyte #  1.3  Eosinophil # 0.1  Basophil # 0.1 (Result(s) reported on 06 Sep 2014 at 08:23AM.)   EKG:  EKG Interp. by me   Interpretation EKG shows paced rhythm, A sensed, V paced, 75.   Radiology Results: CT:    14-Sep-15 02:12, CT Angiography Chest/Abd/Pelvis w/wo  CT Angiography Chest/Abd/Pelvis w/wo   REASON FOR EXAM:    chest and back pain, dissection protocol  COMMENTS:       PROCEDURE: CT  - CT ANGIOGRAPHY CHEST/ABD/PELVIS  - Sep 05 2014  2:12AM     CLINICAL DATA:  Chest and back pain.  Assess for aortic dissection.    EXAM:  CT ANGIOGRAPHY CHEST, ABDOMEN AND PELVIS    TECHNIQUE:  Multidetector CT imaging through the chest, abdomen and pelvis was  performed using the standard protocol during bolus administration of  intravenous contrast. Multiplanar reconstructed images and MIPs were  obtained and reviewed to evaluate the vascular anatomy.  CONTRAST:  125 mL of Isovue 370 IV contrast    COMPARISON:  CT of the chest performed 07/29/2014    FINDINGS:  CTA CHEST FINDINGS    There is no evidence of aortic dissection. No aneurysmal dilatation  is seen. Scattered calcification is noted along the aortic arch and  descending thoracic aorta.    There is no evidence of pulmonary  embolus.    Bibasilar atelectasis or  scarring is noted. A centrally calcified  mildly lobulated 2.5 cm nodule is noted at the medial right lung  base. This is nonspecific and could reflect a variety of conditions.  There is also an 8 mm nodule at the right upper lobe (image 32 of  62).    There is no evidence of significant focal consolidation, pleural  effusion or pneumothorax. No masses are identified; no abnormal  focal contrast enhancement is seen.    The mediastinum is unremarkable in appearance. Visualized  mediastinal nodes remain normal in size. Diffuse coronary artery  calcifications are seen. No pericardial effusion is identified. The  great vessels are grossly in appearance. No axillary lymphadenopathy  is seen. The visualized portions of the thyroid gland are  unremarkable in appearance.  A dense partially calcified 3.0 cm mass is noted at the epicardial  fat pad. This is slightly more prominent than on the recent prior  CT, though this may simply differences in positioning.    No acute osseous abnormalities are seen.    Review of the MIP images confirms the above findings.    CTA ABDOMEN AND PELVIS FINDINGS    There is no evidence of aortic dissection. No aneurysmal dilatation  is seen. Scattered calcification is seen along the abdominal aorta  and its branches. There is ectasia of the distal abdominal aorta,  measuring 2.9 cm in maximal AP dimension.  The celiac trunk, superior mesenteric artery, bilateral renal  arteries and inferior mesenteric artery remain patent. Calcification  is noted along two right-sided renal arteries.    Multiple large stones are seen within the gallbladder, including one  lodged at the neck of the gallbladder. The gallbladder is distended.  This raises concern for some degree of obstruction. The liver and  spleen are grossly unremarkable in appearance. The pancreas and  adrenal glands are within normal limits.    Nonspecific perinephric stranding is noted bilaterally.  Scattered  small bilateral renal cysts are seen. The kidneys are otherwise  unremarkable. There is no evidence of hydronephrosis. No renal or  ureteral stones are seen.  No free fluid is identified. The small bowel is unremarkable in  appearance. The stomach is within normal limits. No acute vascular  abnormalities are seen.    The appendix is normal in caliber, without evidence for  appendicitis. The colon is unremarkable in appearance.    The bladder is mildly distended and grossly unremarkable.  Postoperative change is noted about the pelvis. The patient is  status post prostatectomy. No inguinal lymphadenopathy is seen.    No acute osseous abnormalities are identified. Multilevel vacuum  phenomenon is noted along the lumbar spine, with endplate sclerotic  changes and diffuse facet disease.  Review of the MIP images confirms the above findings.     IMPRESSION:  1. No evidence of aortic dissection. No evidence of aneurysmal  dilatation.  2. No evidence of pulmonary embolus.  3. Multiple large stones within the gallbladder, including one  lodged at the neck of the gallbladder. The gallbladder is distended.  This raises concern for obstruction, and may correlate with the  patient's symptoms.  4. Ectasia of the distal abdominal aorta, measuring 2.9 cm in  maximal AP dimension. Scattered calcification along the abdominal  aorta and its branches, and along the aortic arch and descending  thoracic aorta.  5. Bibasilar atelectasis or scarring noted. Centrally calcified  mildly lobulated 2.5 cm nodule again noted within the medial right  lung base. 8 mm nodule at the right upper lobe. Differences in  measurement from the recent prior study likely reflect technique.  6. Dense partially calcified 3.0 cm mass at the epicardial fat pad.  Findings may reflect prior granulomatous disease.  7. Diffuse coronary artery calcifications noted.  8. Scattered small bilateral renal cysts seen.  9.  Degenerative change noted along the lumbar spine.      Electronically Signed    By: Garald Balding M.D.    On: 09/05/2014 03:56       Verified By: JEFFREY . CHANG, M.D.,    Sulfa drugs: Rash  Vital Signs/Nurse's Notes: **Vital Signs.:   14-Sep-15 19:54  Vital Signs Type Q 4hr  Temperature Temperature (F) 98.3  Celsius 36.8  Temperature Source oral  Pulse Pulse 78  Respirations Respirations 18  Systolic BP Systolic BP 185  Diastolic BP (mmHg) Diastolic BP (mmHg) 79  Mean BP 106  Pulse Ox % Pulse Ox % 91  Pulse Ox Activity Level  At rest  Oxygen Delivery Room Air/ 21 %    Impression 1.  Cholelithiasis with ongoing epigastric/back pain:   Seen by surgery with plan for lap chole possibly today.  In setting of recent epigastric pain, he has undergone extensive cardiac w/u since August, including stress testing (abnl), echo (EF 35-40%), and cath (non-obstructive CAD).   ----->He will not require any additional ischemic testing prior to surgery.  He is low-risk for an ischemic event during the perioperative period but given LV dysfxn, we will need to watch his volume status closely and avoid overhydration with IVF.   --Will decrease IVF rate down to 40 cc/hr --Cont beta blocker therapy throughout perioperative period.  2.  NICM/Chronic systolic CHF:   New dx since August.  His BNP is elevated however he is euvolemic on exam today.  As above, will have to watch volume status closely in the perioperative period in order to avoid CHF.   --Reduce current rate of IVF.  Cont bb.   He was scheduled to be seen in the office yesterday for f/u and initiation of ACEI therapy.  We can plan to do this post-operatively.  3.  CHB:   S/P SJM PPM in 2011.  A sensed, V paced on tele.  4.  HTN:   Stable.  5.  Nonobstructive CAD:   Cath earlier this month.  Resume asa/statin post-op.   Electronic Signatures: Rogelia Mire (NP)  (Signed 15-Sep-15 09:54)  Authored: General Aspect/Present  Illness, History and Physical Exam, Review of System, Family & Social History, Home Medications, Labs, EKG , Radiology, Allergies, Vital Signs/Nurse's Notes, Impression/Plan [name] (MD)  (Signed 15-Sep-15 10:16)  Authored: General Aspect/Present Illness, History and Physical Exam, Review of System, Family & Social History, Past Medical History, Health Issues, Labs, EKG , Vital Signs/Nurse's Notes, Impression/Plan  Co-Signer: General Aspect/Present Illness, Home Medications, Labs, Allergies   Last Updated: 15-Sep-15 10:16 by [name] (MD)

## 2015-04-15 NOTE — H&P (Signed)
PATIENT NAME:  SUEO, Howard MR#:  [id number] DATE OF BIRTH:  [birth year]  DATE OF ADMISSION:  11/11/2014  REQUESTING PHYSICIAN: Wells Guiles L. Reita Cliche, MD  PRIMARY CARE PHYSICIAN: [name] of [facility].   CHIEF COMPLAINT: Passed out.  HISTORY OF PRESENT ILLNESS: An 81 year old Caucasian gentleman with a past medical history of type 2 diabetes, non-insulin-requiring, uncomplicated, as well as a history of Wolff-Parkinson-White status post ablation followed by sick sinus syndrome, now status post permanent pacemaker insertion as well as a history of vertigo, presenting with a syncopal episode. He describes being in his usual state of health. He felt lightheaded at the top of the stairs and the next thing he knew, he was with EMS. Apparently, he fell down about 1 flight of stairs and sustained some minor trauma, a brief episode of loss of consciousness; however, no loss of bowel or bladder function. No seizure-like activity. Currently, he is back at his usual state of health.  REVIEW OF SYSTEMS: CONSTITUTIONAL: Denies any fevers, chills, fatigue, weakness.  EYES: Denies blurred vision, double vision, eye pain.  EARS, NOSE, THROAT: Denies tinnitus, ear pain, hearing loss.  RESPIRATORY: Denies cough, wheeze, shortness of breath.  CARDIOVASCULAR: Denies chest pain, palpitations, edema.  GASTROINTESTINAL: Denies nausea, vomiting, diarrhea, abdominal pain.  GENITOURINARY: Denies dysuria, hematuria.  ENDOCRINE: Denies nocturia or thyroid problems. HEMATOLOGY AND LYMPHATIC: Denies easy bruising and bleeding.  SKIN: Denies rashes or lesions.  MUSCULOSKELETAL: Denies pain in neck, back, shoulder, knees, hips, or arthritic symptoms.  NEUROLOGIC: Denies paralysis, paresthesias.  PSYCHIATRIC: Denies anxiety or depressive symptoms.  Otherwise, full review of systems performed by me is negative.   PAST MEDICAL HISTORY: Vertigo, gastroesophageal reflux disease, type 2 diabetes, non-insulin  requiring, uncomplicated; essential hypertension with a history of Wolff-Parkinson-White status post ablation followed by sick sinus syndrome now status post permanent pacemaker insertion.  SOCIAL HISTORY: Remote tobacco use. Occasional alcohol use. Denies any drug usage.   FAMILY HISTORY: Denies any known cardiovascular or pulmonary disorders.   ALLERGIES: SULFA DRUGS.   HOME MEDICATIONS: Include aspirin 81 mg p.o. q. daily; lisinopril 10 mg p.o. q. daily; metformin 500 mg p.o. q. daily; simvastatin 20 mg p.o. at bedtime; WelChol 3. 75 g p.o. q. daily; metoprolol 25 mg p.o. at bedtime; Norvasc 5 mg p.o. q. daily; hydrochlorothiazide 25 mg p.o. q. daily; MiraLax 17 g p.o. q. daily; Prilosec 40 mg p.o. b.i.d.; bupropion 300 mg p.o. q. daily; multivitamin 1 tab p.o. q. daily; vitamin D3, 1000 international units p.o. q. daily.   PHYSICAL EXAMINATION:  VITAL SIGNS: Temperature 98, heart rate 101, respirations 18, blood pressure 165/94, saturating 95% on room air. Weight 86.4 kg, BMI 28.2.  GENERAL: Well-nourished, well-developed, Caucasian male appearing in no acute distress.  HEAD: Normocephalic. Small area of ecchymosis over the right portion of his forehead as well as an area of tenderness over the left posterior scalp; however, without any actual abrasions.  EYES: Pupils equal, round, reactive to light. Extraocular muscles intact. No scleral icterus.  MOUTH: Moist mucous membranes. Dentition intact. No abscess noted. EARS, NOSE, THROAT: Clear without exudates. No external lesions.  NECK: Supple. No thyromegaly. No nodules. No JVD.  PULMONARY: Clear to auscultation bilaterally without wheezes, rales, or rhonchi. No use of accessory muscles. Good respiratory rate.  CHEST: Nontender to palpation.  CARDIOVASCULAR: S1, S2, regular rate and rhythm. No murmurs, rubs, or gallops. No edema. Pedal pulses 2+ bilaterally. GASTROINTESTINAL: Soft, nontender, nondistended. No masses. Positive bowel sounds. No  hepatosplenomegaly.  MUSCULOSKELETAL: No  swelling, clubbing, or edema. Range of motion full in all extremities.  NEUROLOGIC: Cranial nerves II through XII intact. No gross focal neurological deficits. Sensation intact. Reflexes intact.  SKIN: No ulceration, lesions, rash, cyanosis. Skin warm, dry. Turgor intact.  PSYCHIATRIC: Mood and affect within normal limits. Patient awake, alert, oriented x 3. Insight and judgment are intact.   LABORATORY DATA: EKG performed reveals V-paced rhythm. No ST or T-wave abnormalities. Radiographic imaging: CT, head, performed: No acute intracranial process. Chest x-ray performed: Mild left basilar atelectasis. CT, cervical spine, performed: No acute findings; however, multiple-level degenerative disk disease. Remainder of laboratory data: Sodium 137, potassium 3.9, chloride 104, bicarbonate of 24, BUN 13, creatinine 1.01, glucose 154. Troponin less than 0.02. WBC 5.8, hemoglobin 14.1, platelets of 206,000.   ASSESSMENT AND PLAN: An 81 year old Caucasian male with a history of type 2 diabetes, non-insulin requiring, uncomplicated; history of sick sinus syndrome, now status post permanent pacemaker insertion; as well as vertigo; presenting after a syncopal episode. 1.  Syncope.  Place on telemetry trend. Cardiac enzymes x 2. Permanent pacemaker interrogation has already been performed. No cardiac events occurred. Normal function. Will check orthostatic vital signs now, as that has yet to be performed. Gentle intravenous fluid hydration.  2.  Type 2 diabetes. Hold p.o. agents. Start insulin sliding scale.  3.  Hypertension. Continue Norvasc, metoprolol, lisinopril.  4.  Start proton pump inhibitor therapy.  5.  Venous thromboembolism prophylaxis with heparin subcutaneous.   CODE STATUS: Patient full code.   TIME SPENT: 35 minutes.    ____________________________ Aaron Mose. Othman Masur, MD dkh:ST D: 11/10/2014 23:31:09 ET T: 11/11/2014 01:10:51  ET JOB#: [id number]  cc: Aaron Mose. Aleicia Kenagy, MD, <Dictator> Willi Bor[place]ak Woodfin Ganja MD ELECTRONICALLY SIGNED 11/12/2014 20:31

## 2015-04-15 NOTE — Discharge Summary (Signed)
PATIENT NAME:  Howard [name], KUHL MR#:  [id number] DATE OF BIRTH:  02-Nov-1931  DATE OF ADMISSION:  11/12/2014 DATE OF DISCHARGE:  11/14/2014  DISCHARGE DIAGNOSES: 1.  Benign positional vertigo.  2.  Possible seizure, likely from Wellbutrin.  3.  Syncope.  4.  Hypertension.  5.  Diabetes mellitus.   DISCHARGE MEDICATIONS: Please refer to medication reconciliation.   DISCHARGE INSTRUCTIONS: Low-sodium, carb-controlled diet  Followup with outpatient physical therapy for vestibular PT. Take Wellbutrin 150 mg daily for 5 days and then stop. Follow-up with Dr. Manuella Ghazi, or Dr. Melrose Nakayama with neurology, in 2 to 4 weeks for vertigo and possible seizure. The patient has been set up for EEG on 11/15/2014, for which he will return to the hospital as outpatient.   IMAGING STUDIES:  1.  CT scan of the head without contrast, which showed no acute abnormalities other than generalized atrophy, age-related.  2.  CT of cervical spine showed nothing acute.   ADMITTING HISTORY PHYSICAL: Please see detailed H and P dictated by Dr. Lavetta Nielsen. In brief, an 81 year old Caucasian gentleman with history of Wolff-Parkinson-White syndrome, vertigo, and with pacemaker, presented to the hospital with syncope. The patient was admitted to hospitalist service.   HOSPITAL COURSE: 1.  Syncope versus seizure. The patient had extensive work-up for his syncope recently with his EP physician in [place], which turned out with normal investigation. The patient was seen by neurology, Dr. Tamala Julian, who suggested this was possible seizure, which was provoked by Wellbutrin for which his Wellbutrin has been cut in half, which he will use for 5 days and then stop it completely. No antiseizure medications at this point. The patient's EEG could not be done for 2 days in the hospital, secondary to unavailable EEG test, for which he is being scheduled as outpatient for an EEG, and these results will be sent to his primary care physician. Also follow-up in  neurology as outpatient with Dr. Manuella Ghazi, or Dr. Melrose Nakayama.  2.  Benign positional vertigo. This is much improved, being on scheduled meclizine and with working with PT and vestibular PT. The patient is able to ambulate and does not have any dizziness at this point.  3.  Hypertension, diabetes, fairly controlled during the hospital stay.   PHYSICAL EXAMINATION:  GENERAL: Prior to discharge, the patient is ambulating well on his own.  HEART: S1, S2 heard.  LUNGS: Sound clear.  NEUROLOGIC: Motor strength 5/5 in upper extremities.   TIME SPENT: On day of discharge on discharge activity, was 44 minutes.    ____________________________ Leia Alf Mell Mellott, MD srs:nt D: 11/16/2014 13:51:47 ET T: 11/16/2014 17:46:05 ET JOB#: [id number]  cc: Alveta Heimlich R. Aleena Kirkeby, MD, <Dictator> Neita Carp MD ELECTRONICALLY SIGNED 11/21/2014 15:11

## 2015-04-15 NOTE — Discharge Summary (Signed)
PATIENT NAME:  Howard [name], Howard [name] DATE OF BIRTH:  1931-05-11  DATE OF ADMISSION:  09/05/2014 DATE OF DISCHARGE:  09/09/2014  DISCHARGE DIAGNOSES:  1.  Acute cholecystitis.  2.  Preoperative and postoperative ileus.  3.  History of abdominal surgical history including cardiac ablation.  DISCHARGE MEDICATIONS: Hydrochlorothiazide 25 mg p.o. daily, omeprazole 40 mg p.o. b.i.d., Norvasc 5 mg p.o. daily, Wellbutrin 300 mg p.o. q. 12 hours, metoprolol 25 mg p.o. at bedtime, Zocor 20 mg p.o. at bedtime, metformin 500 mg p.o. daily, Welchol 3.75 mg p.o. daily, Aspirin 81 mg p.o. daily, multivitamin 1 tablet p.o. daily, Norco 1 tablet p.o. q. 4 hours p.r.n. pain, MiraLax 17 grams p.o. daily.   INDICATION FOR ADMISSION:  Mr. Min is a pleasant 81 year old who presents with right upper quadrant pain, ultrasound consistent with cholecystitis, and leukocytosis. He was brought to the operating room for management of cholecystitis.   HOSPITAL COURSE: [name] was admitted on September 14th. He was initially made n.p.o. and given IV antibiotics and IV fluids. He was noted to have a distended abdomen at that time. It did not improve on hospital day 2.  However, he did have significant improvement on hospital day 3 and underwent unremarkable laparoscopic cholecystectomy. Following surgery he was noted to still be distended with postoperative ileus and thus on postoperative day 2 his diet was advanced to regular and he was tolerating p.o. pain meds. He was discharged in satisfactory condition.   DISCHARGE INSTRUCTIONS: [name] will follow up in 1 week with [facility]. He is to call or return to the ED if he has increased pain, nausea, vomiting, redness, drainage from incision.    ____________________________ Glena Norfolk Michalla Ringer, MD cal:lt D: 09/20/2014 06:54:00 ET T: 09/20/2014 07:41:54 ET JOB#: [id number]  cc: Harrell Gave A. Tinia Oravec, MD, <Dictator> Floyde Parkins MD ELECTRONICALLY SIGNED 09/21/2014 0:56

## 2015-04-15 NOTE — H&P (Signed)
History of Present Illness 65 yom who had an episode of abdominal pain 5 weeks ago which resolved and had an episode of back pain radiating to his epigastrium, which started 2 days ago, not associated with nausea or vomiting. No fever; no jaundice Sx. Has extensive cardiac Hx including WPW, which progressed to BBB following ablation of his aberrant tract, leading to a pacemaker. Had a recent stress test and echo, and 1 week ago had a cardiac cath, which reportedly showed non-occlusive CAD and a "weak heart." Cardiologist not at [facility].   Past History Extensive non-abdominal surgical Hx and medical Hx, non of which is documented in [facility] EHR. Home meds in Med Rec. All - Sulfa   Past Med/Surgical Hx:  Anxiety:   GERD - Esophageal Reflux:   Diabetes:   Bundle Branch Block:   HTN:   Pacemaker:   Trigger Finge Surgery:   Prostatectomy:   Hand Surgery - Right:   Cardiac Ablation:   Knee Surgery - Right:   Knee Surgery - Left:   Back Surgery:   Hernia Repair:   ALLERGIES:  Sulfa drugs: Rash  HOME MEDICATIONS: Medication Instructions Status  hydrochlorothiazide 25 mg oral tablet 1 tab(s) orally once a day Active  omeprazole 40 mg oral delayed release capsule 1 cap(s) orally 2 times a day Active  amLODIPine 5 mg oral tablet 1 tab(s) orally once a day Active  buPROPion 300 mg/24 hours oral tablet, extended release 1 tab(s) orally every 24 hours Active  metoprolol 25 mg oral tablet 1 tab(s) orally once a day (at bedtime) Active  simvastatin 20 mg oral tablet 1 tab(s) orally once a day (at bedtime) Active  metFORMIN 500 mg oral tablet 1 tab(s) orally once a day Active  Welchol 3.75 g oral powder for reconstitution 1 each orally once a day Active  aspirin 81 mg oral tablet 1 tab(s) orally once a day (at bedtime) Active  One-A-Day Essentials Multiple Vitamins oral tablet 1 tab(s) orally once a day Active  Vitamin D3 1000 intl units oral capsule 1 cap(s) orally once a day Active   Family and  Social History:  Family History Non-Contributory   Social History negative tobacco, negative ETOH, negative Illicit drugs, married, retired Scientific laboratory technician of [employer]   Review of Systems:  Fever/Chills No   Cough No   Sputum No   Abdominal Pain Yes   Diarrhea No   Constipation No   Nausea/Vomiting No   SOB/DOE No   Chest Pain No   Dysuria No   Tolerating PT Yes   Tolerating Diet Yes   Medications/Allergies Reviewed Medications/Allergies reviewed   Physical Exam:  GEN well developed, well nourished, no acute distress   HEENT pink conjunctivae, PERRL, hearing intact to voice, moist oral mucosa, Oropharynx clear   NECK supple  No masses  trachea midline   RESP normal resp effort  clear BS  no use of accessory muscles   CARD regular rate  no murmur  No LE edema  no Rub   ABD denies tenderness   LYMPH negative neck   EXTR negative cyanosis/clubbing, negative edema   SKIN normal to palpation, skin turgor good   NEURO cranial nerves intact, negative tremor, follows commands, motor/sensory function intact   PSYCH alert, A+O to time, place, person, good insight   Lab Results: Hepatic:  13-Sep-15 22:41   Bilirubin, Total 0.3  Bilirubin, Direct < 0.1 (Result(s) reported on 05 Sep 2014 at 12:21AM.)  Alkaline Phosphatase 74 (  26-948 NOTE: New Reference Range 07/12/14)  SGPT (ALT) 28 (14-63 NOTE: New Reference Range 07/12/14)  SGOT (AST) 29  Total Protein, Serum 7.5  Albumin, Serum 3.7  Routine Chem:  13-Sep-15 22:41   Lipase 198 (Result(s) reported on 05 Sep 2014 at 12:14AM.)  Glucose, Serum  150  BUN 16  Creatinine (comp) 0.95  Sodium, Serum 136  Potassium, Serum 3.9  Chloride, Serum 100  CO2, Serum 24  Calcium (Total), Serum 9.0  Anion Gap 12  Osmolality (calc) 276  eGFR (African American) >60  eGFR (Non-African American) >60 (eGFR values <66mL/min/1.73 m2 may be an indication of chronic kidney disease (CKD). Calculated eGFR is  useful in patients with stable renal function. The eGFR calculation will not be reliable in acutely ill patients when serum creatinine is changing rapidly. It is not useful in  patients on dialysis. The eGFR calculation may not be applicable to patients at the low and high extremes of body sizes, pregnant women, and vegetarians.)  B-Type Natriuretic Peptide [facility])  2762 (Result(s) reported on 04 Sep 2014 at 11:45PM.)  Cardiac:  13-Sep-15 22:41   Troponin I < 0.02 (0.00-0.05 0.05 ng/mL or less: NEGATIVE  Repeat testing in 3-6 hrs  if clinically indicated. >0.05 ng/mL: POTENTIAL  MYOCARDIAL INJURY. Repeat  testing in 3-6 hrs if  clinically indicated. NOTE: An increase or decrease  of 30% or more on serial  testing suggests a  clinically important change)  Routine UA:  14-Sep-15 01:00   Color (UA) Yellow  Clarity (UA) Clear  Glucose (UA) 150 mg/dL  Bilirubin (UA) Negative  Ketones (UA) Trace  Specific Gravity (UA) 1.018  Blood (UA) Negative  pH (UA) 5.0  Protein (UA) 30 mg/dL  Nitrite (UA) Negative  Leukocyte Esterase (UA) Negative (Result(s) reported on 05 Sep 2014 at 01:34AM.)  RBC (UA) 1 /HPF  WBC (UA) NONE SEEN  Bacteria (UA) NONE SEEN  Epithelial Cells (UA) NONE SEEN  Mucous (UA) PRESENT  Hyaline Cast (UA) 1 /LPF (Result(s) reported on 05 Sep 2014 at 01:34AM.)  Routine Hem:  13-Sep-15 22:41   WBC (CBC) 9.5  RBC (CBC) 5.74  Hemoglobin (CBC) 15.3  Hematocrit (CBC) 46.2  Platelet Count (CBC) 235 (Result(s) reported on 04 Sep 2014 at 11:37PM.)  MCV 81  MCH 26.7  MCHC 33.1  RDW  14.7   Radiology Results: XRay:    14-Sep-15 00:06, Chest Portable Single View  Chest Portable Single View  REASON FOR EXAM:    cp  COMMENTS:       PROCEDURE: DXR - DXR PORTABLE CHEST SINGLE VIEW  - Sep 05 2014 12:06AM     CLINICAL DATA:  Back pain, radiating to the chest.    EXAM:  PORTABLE CHEST - 1 VIEW    COMPARISON:  Chest radiograph and CT of the chest  performed  07/29/2014    FINDINGS:  The lungs are well-aerated. Minimal left basilar atelectasis is  noted. There is no evidence of pleural effusion or pneumothorax.    The cardiomediastinal silhouette is borderline normal in size. A  pacemaker is noted overlying the left chest wall, with leads ending  overlying the right atrium and right ventricle. No acute osseous  abnormalities are seen.     IMPRESSION:  Minimal left basilar atelectasis noted; lungs otherwise clear.      Electronically Signed    By: Garald Balding M.D.    On: 09/05/2014 01:02     Verified By: JEFFREY . Radene Knee, M.D.,  LabUnknown:  PACS  Image    14-Sep-15 02:12, CT Angiography Chest/Abd/Pelvis w/wo  PACS Image  CT:  CT Angiography Chest/Abd/Pelvis w/wo  REASON FOR EXAM:    chest and back pain, dissection protocol  COMMENTS:       PROCEDURE: CT  - CT ANGIOGRAPHY CHEST/ABD/PELVIS  - Sep 05 2014  2:12AM     CLINICAL DATA:  Chest and back pain.  Assess for aortic dissection.    EXAM:  CT ANGIOGRAPHY CHEST, ABDOMEN AND PELVIS    TECHNIQUE:  Multidetector CT imaging through the chest, abdomen and pelvis was  performed using the standard protocol during bolus administration of  intravenous contrast. Multiplanar reconstructed images and MIPs were  obtained and reviewed to evaluate the vascular anatomy.  CONTRAST:  125 mL of Isovue 370 IV contrast    COMPARISON:  CT of the chest performed 07/29/2014    FINDINGS:  CTA CHEST FINDINGS    There is no evidence of aortic dissection. No aneurysmal dilatation  is seen. Scattered calcification is noted along the aortic arch and  descending thoracic aorta.    There is no evidence of pulmonary embolus.    Bibasilar atelectasis or scarring is noted. A centrally calcified  mildly lobulated 2.5 cm nodule is noted at the medial right lung  base. This is nonspecific and could reflect a variety of conditions.  There is also an 8 mm nodule at the right upper lobe  (image 32 of  62).    There is no evidence of significant focal consolidation, pleural  effusion or pneumothorax. No masses are identified; no abnormal  focal contrast enhancement is seen.    The mediastinum is unremarkable in appearance. Visualized  mediastinal nodes remain normal in size. Diffuse coronary artery  calcifications are seen. No pericardial effusion is identified. The  great vessels are grossly in appearance. No axillary lymphadenopathy  is seen. The visualized portions of the thyroid gland are  unremarkable in appearance.  A dense partially calcified 3.0 cm mass is noted at the epicardial  fat pad. This is slightly more prominent than on the recent prior  CT, though this may simply differences in positioning.    No acute osseous abnormalities are seen.    Review of the MIP images confirms the above findings.    CTA ABDOMEN AND PELVIS FINDINGS    There is no evidence of aortic dissection. No aneurysmal dilatation  is seen. Scattered calcification is seen along the abdominal aorta  and its branches. There is ectasia of the distal abdominal aorta,  measuring 2.9 cm in maximal AP dimension.  The celiac trunk, superior mesenteric artery, bilateral renal  arteries and inferior mesenteric artery remain patent. Calcification  is noted along two right-sided renal arteries.    Multiple large stones are seen within the gallbladder, including one  lodged at the neck of the gallbladder. The gallbladder is distended.  This raises concern for some degree of obstruction. The liver and  spleen are grossly unremarkable in appearance. The pancreas and  adrenal glands are within normal limits.    Nonspecific perinephric stranding is noted bilaterally. Scattered  small bilateral renal cysts are seen. The kidneys are otherwise  unremarkable. There is no evidence of hydronephrosis. No renal or  ureteral stones are seen.  No free fluid is identified. The small bowel is unremarkable  in  appearance. The stomach is within normal limits. No acute vascular  abnormalities are seen.    The appendix is normal in caliber, without evidence for  appendicitis. The  colon is unremarkable in appearance.    The bladder is mildly distended and grossly unremarkable.  Postoperative change is noted about the pelvis. The patient is  status post prostatectomy. No inguinal lymphadenopathy is seen.    No acute osseous abnormalities are identified. Multilevel vacuum  phenomenon is noted along the lumbar spine, with endplate sclerotic  changes and diffuse facet disease.  Review of the MIP images confirms the above findings.     IMPRESSION:  1. No evidence of aortic dissection. No evidence of aneurysmal  dilatation.  2. No evidence of pulmonary embolus.  3. Multiple large stones within the gallbladder, including one  lodged at the neck of the gallbladder. The gallbladder is distended.  This raises concern for obstruction, and may correlate with the  patient's symptoms.  4. Ectasia of the distal abdominal aorta, measuring 2.9 cm in  maximal AP dimension. Scattered calcification along the abdominal  aorta and its branches, and along the aortic arch and descending  thoracic aorta.  5. Bibasilar atelectasis or scarring noted. Centrally calcified  mildly lobulated 2.5 cm nodule again noted within the medial right  lung base. 8 mm nodule at the right upper lobe. Differences in  measurement from the recent prior study likely reflect technique.  6. Dense partially calcified 3.0 cm mass at the epicardial fat pad.  Findings may reflect prior granulomatous disease.  7. Diffuse coronary artery calcifications noted.  8. Scattered small bilateral renal cysts seen.  9. Degenerative change noted along the lumbar spine.      Electronically Signed    By: Garald Balding M.D.    On: 09/05/2014 03:56       Verified By: JEFFREY . CHANG, M.D.,    Assessment/Admission Diagnosis Sx cholelithiasis  vs developing gallbladder hydrops   Plan Admit, analgesia Medical cardiac evaluation for risk followed by possible lap CCY (pt and wife understand plan and agree)   Electronic Signatures: Consuela Mimes (MD)  (Signed 14-Sep-15 04:50)  Authored: CHIEF COMPLAINT and HISTORY, PAST MEDICAL/SURGIAL HISTORY, ALLERGIES, HOME MEDICATIONS, FAMILY AND SOCIAL HISTORY, REVIEW OF SYSTEMS, PHYSICAL EXAM, LABS, Radiology, ASSESSMENT AND PLAN   Last Updated: 14-Sep-15 04:50 by Consuela Mimes (MD)

## 2015-04-15 NOTE — Consult Note (Signed)
Referring Physician:  Wyatt Haste   Primary Care Physician:  [facility], [address], [place], [place] [address], [place] [phone]  Reason for Consult: Admit Date: 10-Nov-2014  Chief Complaint: syncope  Reason for Consult: seizure   History of Present Illness: History of Present Illness:   81 yo RHD [name] presents to [facility] after passing out at home.  Pt has hx of BPPV since 1981 and has had multiple spells.  He just recently had Epley maneuvers which have helped.  He was at home when he had a sudden feeling of dizziness and fell down the stairs.  Wife found him at the bottom of the stairs and he was confused.  Pt does not remember the episode.  Unlike previous BPPV events, pt does not remember this episode, pt was confused after and pt had injuries.  There was no incontinence or tongue biting.  Pt had some meclizine added yesterday with improvement of dizziness.  No focal weakness or numbness.  ROS:  General denies complaints   HEENT no complaints   Lungs no complaints   Cardiac no complaints   GI no complaints   GU no complaints   Musculoskeletal no complaints   Extremities no complaints   Skin no complaints   Neuro no complaints   Endocrine no complaints   Psych no complaints   Past Medical/Surgical Hx:  Vertigo:   Anxiety:   GERD - Esophageal Reflux:   Diabetes:   Bundle Branch Block:   HTN:   Laminectomy:   Cholecystectomy:   Pacemaker:   Trigger Finge Surgery:   Prostatectomy:   Hand Surgery - Right:   Cardiac Ablation:   Knee Surgery - Right:   Knee Surgery - Left:   Back Surgery:   Hernia Repair:   Past Medical/ Surgical Hx:  Past Medical History reviewed by me as above   Past Surgical History reviewed by me as above   Home Medications: Medication Instructions Last Modified Date/Time  polyethylene glycol 3350 oral powder for reconstitution 17 gram(s) orally once a day 19-Nov-15 21:05  lisinopril 10 mg oral  tablet 1 tab(s) orally once a day 19-Nov-15 21:05  Vitamin D3 1000 intl units oral tablet 1 tab(s) orally once a day 19-Nov-15 21:05  amLODIPine 5 mg oral tablet 1 tab(s) orally once a day 19-Nov-15 21:05  omeprazole 40 mg oral delayed release capsule 1 cap(s) orally 2 times a day 19-Nov-15 21:05  hydrochlorothiazide 25 mg oral tablet 1 tab(s) orally once a day 19-Nov-15 21:05  buPROPion 300 mg/24 hours oral tablet, extended release 1 tab(s) orally every 24 hours 19-Nov-15 21:05  metoprolol 25 mg oral tablet 1 tab(s) orally once a day (at bedtime) 19-Nov-15 21:05  simvastatin 20 mg oral tablet 1 tab(s) orally once a day (at bedtime) 19-Nov-15 21:05  metFORMIN 500 mg oral tablet 1 tab(s) orally once a day 19-Nov-15 21:05  Welchol 3.75 g oral powder for reconstitution 1 each orally once a day 19-Nov-15 21:05  aspirin 81 mg oral tablet 1 tab(s) orally once a day (at bedtime) 19-Nov-15 21:05  One-A-Day Essentials Multiple Vitamins oral tablet 1 tab(s) orally once a day 19-Nov-15 21:05   Allergies:  Sulfa drugs: Rash  Allergies:  Allergies sulfa   Social/Family History: Employment Status: retired  Lives With: spouse  Living Arrangements: house  Social History: no tob, no EtOH, no illicits  Family History: no seizures, no strokes   Vital Signs: **Vital Signs.:   21-Nov-15 11:42  Vital Signs Type Routine  Temperature Temperature (F) 98.2  Celsius Howard.7  Temperature Source oral  Pulse Pulse 72  Respirations Respirations 18  Systolic BP Systolic BP 449  Diastolic BP (mmHg) Diastolic BP (mmHg) 81  Mean BP 102  Pulse Ox % Pulse Ox % 93  Pulse Ox Activity Level  At rest  Oxygen Delivery Room Air/ 21 %   Physical Exam: General: NAD, nl weight  HEENT: normocephalic, sclera nonicteric, oropharynx clear  Neck: supple, no JVD, no bruits  Chest: CTA B, no wheezing, good movement  Cardiac: RRR, no murmurs, no edema, 2+ pulses  Extremities: no C/C/E, FROM   Neurologic Exam: Mental  Status: alert and oriented x 3, normal speech and language, follows complex commands  Cranial Nerves: PERRLA, EOMI, nl VF, face symmetric, tongue midline, shoulder shrug equal  Motor Exam: 5/5 B normal, tone, no tremor  Deep Tendon Reflexes: 1+/4 B, downgoing plantars  Sensory Exam: decreased vibration below waist, nl temp  Coordination: F to N WNL, problems with lower ext   Lab Results: Routine Chem:  19-Nov-15 20:41   Glucose, Serum  154  BUN 13  Creatinine (comp) 1.01  Sodium, Serum 137  Potassium, Serum 3.9  Chloride, Serum 104  CO2, Serum 24  Calcium (Total), Serum 8.5  Anion Gap 9  Osmolality (calc) 277  eGFR (African American) >60  eGFR (Non-African American) >60 (eGFR values <58mL/min/1.73 m2 may be an indication of chronic kidney disease (CKD). Calculated eGFR, using the MRDR Study equation, is useful in  patients with stable renal function. The eGFR calculation will not be reliable in acutely ill patients when serum creatinine is changing rapidly. It is not useful in patients on dialysis. The eGFR calculation may not be applicable to patients at the low and high extremes of body sizes, pregnant women, and vegetarians.)  Cardiac:  20-Nov-15 04:42   Troponin I 0.03 (0.00-0.05 0.05 ng/mL or less: NEGATIVE  Repeat testing in 3-6 hrs  if clinically indicated. >0.05 ng/mL: POTENTIAL  MYOCARDIAL INJURY. Repeat  testing in 3-6 hrs if  clinically indicated. NOTE: An increase or decrease  of 30% or more on serial  testing suggests a  clinically important change)  Routine UA:  20-Nov-15 04:50   Color (UA) Yellow  Clarity (UA) Clear  Glucose (UA) Negative  Bilirubin (UA) Negative  Ketones (UA) Negative  Specific Gravity (UA) 1.015  Blood (UA) Negative  pH (UA) 6.0  Protein (UA) Negative  Nitrite (UA) Negative  Leukocyte Esterase (UA) Negative (Result(s) reported on 11 Nov 2014 at 05:24AM.)  RBC (UA) 1 /HPF  WBC (UA) <1 /HPF  Bacteria (UA) NONE SEEN   Epithelial Cells (UA) NONE SEEN  Mucous (UA) PRESENT (Result(s) reported on 11 Nov 2014 at 05:24AM.)  Routine Hem:  19-Nov-15 20:41   WBC (CBC) 5.8  RBC (CBC) 5.33  Hemoglobin (CBC) 14.1  Hematocrit (CBC) 43.6  Platelet Count (CBC) 206 (Result(s) reported on 10 Nov 2014 at 09:03PM.)  MCV 82  MCH 26.4  MCHC 32.3  RDW  15.3   Radiology Results: CT:    19-Nov-15 21:08, CT Head Without Contrast  CT Head Without Contrast   REASON FOR EXAM:    FALL, HEAD INJURY  COMMENTS:       PROCEDURE: CT  - CT HEAD WITHOUT CONTRAST  - Nov 10 2014  9:08PM     CLINICAL DATA:  Syncope, fall, found on floor at bottom of steps  unconscious    EXAM:  CT HEAD WITHOUT  CONTRAST    CT CERVICAL SPINE WITHOUT CONTRAST    TECHNIQUE:  Multidetector CT imaging of the head and cervical spine was  performed following the standard protocol without intravenous  contrast. Multiplanar CT image reconstructions of the cervical spine  were also generated.    COMPARISON:  None    FINDINGS:  CT HEAD FINDINGS    Streak artifacts, for which repeat imaging was performed.    Normal ventricular morphology.    Generalized atrophy.  No midline shift or mass effect.    Small vessel chronic ischemic changes of deep cerebral white matter.    No intracranial hemorrhage, mass lesion, or evidence acute  infarction.    No extra-axial fluid collections.    Fluid or mucus within sphenoid sinus.    Atherosclerotic calcification at the carotid siphons bilaterally.    Calvaria intact.  CT CERVICAL SPINE FINDINGS    Visualized skullbase intact.    Bones demineralized.    Prevertebral soft tissues normal thickness.    Scattered beam hardening artifacts of dental origin.    Multilevel degenerative disc and facet disease changes throughout  the cervical spine.    C5-C7 fusion with evidence of prior LEFT C7 laminectomy.  Minimal anterolisthesis at C7-T1.    Vertebral body heights maintained without  fracture or subluxation.    Narrowing of BILATERAL C3-C4, RIGHT C4-C5, and LEFT C6-C7 neural  foramina by uncovertebral spurs, less severe at remaining cervical  levels.    Atherosclerotic calcifications of the carotid systems bilaterally.     IMPRESSION:  Atrophy with small vessel chronic ischemic changes of deepcerebral  white matter.    No definite acute intracranial abnormalities.  Multilevel degenerative disc and facet disease changes of the  cervical spine as above.    No acute cervical spine abnormalities.      Electronically Signed    By: Lavonia Dana [name].D.    On: 11/10/2014 21:33         Verified By: Burnetta Sabin, [name].D.,   Radiology Impression: Radiology Impression: CT of head personally reviewed by me and shows mild atrophy and moderate white matter changes   Impression/Recommendations: Recommendations:   prior notes reviewed by me reviewed by me   Possible seizure-  the episode 2 days is more concerning for a provoked seizure given lack of memory and injuries BPPV-  improved, this is a chronic condition Mild white matter changes-  asymptomatic Peripheral neuropathy-  moderate and can make walking worse EEG on Monday or outpatient decrease Bupropion to 150mg  daily continue Meclizine 25mg  TID x 5 days then 12.5mg  TID x 5 days then stop will give Epley maneuvers handout check B12/folate and TSH for neuropathy will follow briefly but pt is ok to discharge at any time  Electronic Signatures: Jamison Neighbor (MD)  (Signed 21-Nov-15 15:50)  Authored: REFERRING PHYSICIAN, Primary Care Physician, Consult, History of Present Illness, Review of Systems, PAST MEDICAL/SURGICAL HISTORY, HOME MEDICATIONS, ALLERGIES, Social/Family History, NURSING VITAL SIGNS, Physical Exam-, LAB RESULTS, RADIOLOGY RESULTS, Recommendations   Last Updated: 21-Nov-15 15:50 by Jamison Neighbor (MD)

## 2015-04-15 NOTE — Op Note (Signed)
PATIENT NAME:  Howard [name], Howard [name] MR#:  [id number] DATE OF BIRTH:  25-Jan-1931  DATE OF PROCEDURE:  09/07/2014  PREOPERATIVE DIAGNOSIS: Acute cholecystitis.   POSTOPERATIVE DIAGNOSIS: Acute cholecystitis.   PROCEDURE PERFORMED: Laparoscopic cholecystectomy.   SURGEON: Marlyce Huge, MD  ANESTHESIA: General endotracheal.   ESTIMATED BLOOD LOSS: 25 mL.  COMPLICATIONS: None.  SPECIMENS: Gallbladder.  INDICATION FOR SURGERY: [name] is a pleasant 81 year old male who presents with right upper quadrant pain and leukocytosis and an ultrasound concerning for cholecystitis. He was brought to the operating room suite for laparoscopic cholecystectomy.   DETAILS OF PROCEDURE: Informed consent was obtained. [name] was brought to the operating room suite. He was induced. Endotracheal tube was placed. General anesthesia was administered. His abdomen was prepped and draped in standard surgical fashion. A timeout was then performed to correctly identify the patient's name, operative site and procedure to be performed. A supraumbilical incision was made. It was deepened down to the fascia. The fascia was incised. The peritoneum was entered. Two stay sutures were placed in the fasciotomy. A Hassan trocar was placed in the abdomen. The abdomen was insufflated. An epigastric 11 mm and 2 right subcostal 5 mm trocars were placed in the midclavicular and anterior axillary line. The gallbladder was then lifted up over the dome of the liver. Cystic artery and cystic duct were dissected out. These were ligated. The critical view was obtained prior to this. The gallbladder was then taken off the gallbladder fossa and brought out through an Endo Catch bag. The gallbladder fossa was then examined. It appeared to be oozing, but no obvious surgical bleeding points. Surgicel was placed into the fossa. After I was happy with hemostasis and irrigation had occurred, the trocars were taken out under direct  visualization. The supraumbilical fascia was closed with the previously placed sutures and additional figure-of-eight 0 Vicryl used to close the fasciotomy. The skin was then closed with interrupted deep dermal 4-0 Monocryl suture.  Steri-Strips, Telfa gauze and Tegaderm were used to complete the dressing. The patient was then awoken, extubated and brought to the postanesthesia care unit. There were no immediate complications. Needle, sponge, and instrument counts were correct at the end of the procedure.   ____________________________ Glena Norfolk. Teah Votaw, MD cal:sb D: 09/08/2014 09:24:00 ET T: 09/08/2014 11:35:53 ET JOB#: [id number]  cc: Harrell Gave A. Yomar Mejorado, MD, <Dictator> Floyde Parkins MD ELECTRONICALLY SIGNED 09/21/2014 0:55

## 2015-04-17 ENCOUNTER — Other Ambulatory Visit: Payer: Self-pay | Admitting: Cardiovascular Disease

## 2015-04-17 DIAGNOSIS — I420 Dilated cardiomyopathy: Secondary | ICD-10-CM

## 2015-04-17 DIAGNOSIS — I442 Atrioventricular block, complete: Secondary | ICD-10-CM

## 2015-04-19 ENCOUNTER — Encounter: Payer: Self-pay | Admitting: Internal Medicine

## 2015-04-20 DIAGNOSIS — R2689 Other abnormalities of gait and mobility: Secondary | ICD-10-CM | POA: Diagnosis not present

## 2015-04-20 DIAGNOSIS — G510 Bell's palsy: Secondary | ICD-10-CM | POA: Diagnosis not present

## 2015-04-20 DIAGNOSIS — R55 Syncope and collapse: Secondary | ICD-10-CM | POA: Diagnosis not present

## 2015-04-25 ENCOUNTER — Other Ambulatory Visit: Payer: Self-pay | Admitting: Cardiovascular Disease

## 2015-04-25 DIAGNOSIS — I429 Cardiomyopathy, unspecified: Secondary | ICD-10-CM

## 2015-04-26 ENCOUNTER — Ambulatory Visit (INDEPENDENT_AMBULATORY_CARE_PROVIDER_SITE_OTHER): Payer: Medicare Other | Admitting: Neurology

## 2015-04-26 ENCOUNTER — Encounter: Payer: Self-pay | Admitting: Neurology

## 2015-04-26 VITALS — BP 109/66 | HR 69 | Resp 16 | Ht 69.0 in | Wt 190.0 lb

## 2015-04-26 DIAGNOSIS — W19XXXA Unspecified fall, initial encounter: Secondary | ICD-10-CM | POA: Diagnosis not present

## 2015-04-26 DIAGNOSIS — R42 Dizziness and giddiness: Secondary | ICD-10-CM

## 2015-04-26 DIAGNOSIS — H9202 Otalgia, left ear: Secondary | ICD-10-CM

## 2015-04-26 DIAGNOSIS — Z9289 Personal history of other medical treatment: Secondary | ICD-10-CM | POA: Diagnosis not present

## 2015-04-26 DIAGNOSIS — Z974 Presence of external hearing-aid: Secondary | ICD-10-CM | POA: Diagnosis not present

## 2015-04-26 DIAGNOSIS — H9192 Unspecified hearing loss, left ear: Secondary | ICD-10-CM | POA: Diagnosis not present

## 2015-04-26 DIAGNOSIS — T1490 Injury, unspecified: Secondary | ICD-10-CM | POA: Diagnosis not present

## 2015-04-26 DIAGNOSIS — Z9889 Other specified postprocedural states: Secondary | ICD-10-CM | POA: Diagnosis not present

## 2015-04-26 DIAGNOSIS — G510 Bell's palsy: Secondary | ICD-10-CM

## 2015-04-26 NOTE — Patient Instructions (Signed)
You have Bell's Palsy: weakness of your facial muscle due to irritation of your facial nerve on the left. Bell's palsy gets better with time. Continue doing your facial exercises. Use artificial tears to your affected eye during the day as needed and use a lubricant ointment in that eye at night and keep your eye closed at night by using an eye patch. If there is eye pain and redness, you will have to see an eye doctor. The most dangerous complication from Bell's palsy is corneal infection or damage. We will do a CT head with contrast. You have been given steroids and an antiviral medication, which, as I understand, you finished.    We will call you with the CT results.

## 2015-04-26 NOTE — Progress Notes (Signed)
Subjective:    Patient ID: Howard [name] is a 81 y.o. male.  HPI     Star Age, MD, PhD [facility] [address] [place], [place] [address]  Dear [name],   I saw your patient, Howard [name], upon your kind request in my neurologic clinic today for initial consultation of his left Bell's palsy. The patient is accompanied by his wife today. As you know, [name] is a very pleasant 81 year old right-handed gentleman with an underlying medical history of complete heart block, status post pacemaker placement in 2011, nonischemic cardiomyopathy, sleep apnea, depression, arthritis, prostate cancer, syncope, hyperlipidemia, diabetes, hypertension, reflux disease, lumbar spinal stenosis, hyperlipidemia, and coronary artery disease, who presented to [facility] emergency room on 04/12/2015 with new onset left facial weakness and he was diagnosed with Bell's palsy. He was prescribed 2 different medications which I am assuming was a steroid and an antiviral medication.  He finished his medications. He does endorse that he was given steroids and antiviral medication. He feels perhaps a little improved in that his ear on the left side no longer hurts as badly. He reports left-sided ear pain which started March. He has seen an ENT specialist, Dr. Tami Ribas out of [facility] and was given steroid eardrops. He was told that his left ear canal looked inflamed. I do not have records available for review from Dr. Ileene Hutchinson office. He also had more hearing loss on the left. Of note, he has had bilateral hearing aids for about 20 years. He has had multiple surgeries. He brought in a list of his surgeries. He had hernia surgery in 1955, ruptured disc in 1962, cervical fusion in 86, bilateral knee replacement surgeries in 1991 and 1992 on the left in 1995 on the right. He had prostatectomy 1998, he had a ruptured disc in 1999, he had a repeat right knee  replacement in 2004. He had an accident to his left hand in 2005. He had spine surgery in 2011 and pacemaker placed in December 2011. He had trigger finger surgery bilaterally. He had cataract repairs. He had knee replacement surgery in 2013. He had lumbar laminectomy in June 2015. Gallbladder was removed in September 2015. Trigger finger surgery was performed at therapy. 2016 and most recently had carpal tunnel surgery on 04/04/2015 on the right. He has a history of recurrent vertigo which started in 1981. He had sudden vertigo in November. Of note, he had a fall downstairs a whole flight of stairs in November 2015 and was taken to [facility]. Thankfully he did not have any major injuries. His left facial weakness was noticeable on 04/12/2015. He noticed in the evening that he could not close his left eye and his wife noticed that his face looked crooked. She gave him a adult size aspirin and called 911. He was taken to the hospital. He had a head CT without contrast on 04/12/2015 and I reviewed the report: No intracranial hemorrhage or visible infarct, normal for age appearance of the brain. He reports no one-sided weakness or numbness or headaches. He has an echocardiogram scheduled for next week as I understand.  His Past Medical History Is Significant For: Past Medical History  Diagnosis Date  . Hyperlipidemia   . Hypertension   . Complete heart block     a. 11/2010 s/p SJM 2210 [facility] DC PPM, ser# [id number].  . WPW (Wolff-Parkinson-White syndrome)     a. S/P RFCA 1991.  . Sleep apnea  a. cpap  . Diabetes mellitus without complication   . LBBB (left bundle branch block)   . GERD (gastroesophageal reflux disease)   . Arthritis   . Cancer     prostate and skin  . NICM (nonischemic cardiomyopathy)     a. 07/2014 Echo: EF 35-40%, mid-apicalanteroseptal DK, Gr 1 DD, mild-mod dil LA.  Marland Kitchen Chronic combined systolic and diastolic CHF, NYHA class 1     a. 07/2014 Echo: EF 35-40%, Gr 1 DD.  Marland Kitchen  Vertigo   . Depression   . Non-obstructive CAD     a. 07/2014 Abnl MV;  b. 08/2014 Cath: LM nl, LAD 30p, RI 40p, LCX nl, OM1 40, RCA dominant 30p, 70d-->Med Rx.  . Left-sided Bell's palsy   . Poor balance     His Past Surgical History Is Significant For: Past Surgical History  Procedure Laterality Date  . Ruptured disc      1962 and 1998  . Cervical fusion    . Knee surgery      left knee 1991 and 1992; right knee 1995  . Prostate surgery      cancer--1998  . Replacement total knee      2004  . Hand surgery      right 1993; left 2005  . Back surgery      2011  . Pacemaker insertion      PPM-- St Jude 11/30/10 by [name]  . Catheter ablation  1991    for WPW  . Joint replacement Left 2013    knee  . Joint replacement Right 2004    knee  . Lumbar laminectomy/decompression microdiscectomy N/A 06/07/2014    Procedure: LUMBAR FOUR TO FIVE LUMBAR LAMINECTOMY/DECOMPRESSION MICRODISCECTOMY 1 LEVEL;  Surgeon: [name], MD;  Location: [place] [facility];  Service: Neurosurgery;  Laterality: N/A;  . Insert / replace / remove pacemaker    . Cholecystectomy    . Cardiac catheterization  08/26/2014    Single vessel obstructive CAD  . Left heart catheterization with coronary angiogram N/A 08/26/2014    Procedure: LEFT HEART CATHETERIZATION WITH CORONARY ANGIOGRAM;  Surgeon: [name], MD;  Location[facility] CATH LAB;  Service: Cardiovascular;  Laterality: N/A;    His Family History Is Significant For: Family History  Problem Relation Age of Onset  . CAD    . Heart attack Mother   . Hyperlipidemia Mother     His Social History Is Significant For: History   Social History  . Marital Status: Married    Spouse Name: N/A  . Number of Children: 2  . Years of Education: College   Occupational History  . Retired    Social History Main Topics  . Smoking status: Former Research scientist (life sciences)  . Smokeless tobacco: Not on file     Comment: Quit 2011  . Alcohol Use: 0.0 oz/week    0 Standard drinks or  equivalent per week     Comment: occasional  . Drug Use: No  . Sexual Activity: Not on file   Other Topics Concern  . None   Social History Narrative   Drinks 2 cups of coffee a day     His Allergies Are:  Allergies  Allergen Reactions  . Sulfonamide Derivatives Rash  :   His Current Medications Are:  Outpatient Encounter Prescriptions as of 04/26/2015  Medication Sig  . amLODipine (NORVASC) 5 MG tablet Take 1 tablet (5 mg total) by mouth daily.  Marland Kitchen aspirin 81 MG tablet Take 81 mg by  mouth daily.    . Cholecalciferol (VITAMIN D3) 1000 UNITS CAPS Take 1,000 Units by mouth daily.   . Colesevelam HCl (WELCHOL) 3.75 G PACK Take 3.75 g by mouth daily. Mix in [place] of water and drink.  . hydrochlorothiazide 25 MG tablet Take 1 tablet (25 mg total) by mouth daily.  Marland Kitchen lisinopril (PRINIVIL,ZESTRIL) 10 MG tablet Take 1 tablet (10 mg total) by mouth daily.  . metFORMIN (GLUCOPHAGE) 500 MG tablet Take 1 tablet by mouth daily.  . metoprolol succinate (TOPROL-XL) 25 MG 24 hr tablet Take 25 mg by mouth every evening.  . Multiple Vitamin (MULTIVITAMIN) capsule Take 1 capsule by mouth daily.    Marland Kitchen omeprazole (PRILOSEC) 40 MG capsule Take 40 mg by mouth 2 (two) times daily.  . simvastatin (ZOCOR) 40 MG tablet Take 1 tablet (40 mg total) by mouth at bedtime.  . [DISCONTINUED] buPROPion (WELLBUTRIN XL) 300 MG 24 hr tablet Take 300 mg by mouth daily.   No facility-administered encounter medications on file as of 04/26/2015.  :   Review of Systems:  Out of a complete 14 point review of systems, all are reviewed and negative with the exception of these symptoms as listed below:   Review of Systems  HENT: Positive for hearing loss.   Gastrointestinal:       Incontinence  Genitourinary:       Incontinence   Neurological: Positive for weakness and numbness.    Objective:  Neurologic Exam  Physical Exam Physical Examination:   Filed Vitals:   04/26/15 1021  BP: 109/66  Pulse: 69  Resp: 16     General Examination: The patient is a very pleasant 81 y.o. male in no acute distress. He appears well-developed and well-nourished and well groomed. He does not have any vertiginous symptoms upon standing or changing head positions today.  HEENT: Normocephalic, atraumatic, pupils are equal, round and reactive to light and accommodation. Funduscopic exam is normal with sharp disc margins noted. Extraocular tracking is good without limitation to gaze excursion or nystagmus noted. Normal smooth pursuit is noted. Hearing isimpaired bilaterally and he has hearing aids in place bilaterally. He is status post cataract repairs. TM is clear on the right but I could not see the tympanic membrane clearly on the left. He does not have any redness in his ear canals or blisters. Face is  asymmetric with left-sided Bell's palsy noted. He has upon eye closure left Bell's phenomenon. He has normal facial sensation bilaterally. He has lower motor neuron type weakness on the left. Speech is clear with no dysarthria noted. There is no hypophonia. There is no lip, neck/head, jaw or voice tremor. Neck is supple with full range of passive and active motion. There are no carotid bruits on auscultation. Oropharynx exam reveals: mild mouth dryness, adequate dental hygiene and mild airway crowding, due to redundant soft palate.   Chest: Clear to auscultation without wheezing, rhonchi or crackles noted.  Heart: S1+S2+0, regular and normal without murmurs, rubs or gallops noted.   Abdomen: Soft, non-tender and non-distended with normal bowel sounds appreciated on auscultation.  Extremities: There is no pitting edema in the distal lower extremities bilaterally. Pedal pulses are intact.  Skin: Warm and dry without trophic changes noted. There are no varicose veins.  Musculoskeletal: exam reveals no obvious joint deformities, tenderness or joint swelling or erythema.   Neurologically:  Mental status: The patient is awake,  alert and oriented in all 4 spheres. His immediate and remote memory, attention, language skills  and fund of knowledge are appropriate. There is no evidence of aphasia, agnosia, apraxia or anomia. Speech is clear with normal prosody and enunciation. Thought process is linear. Mood is normal and affect is normal.  Cranial nerves II - XII are as described above under HEENT exam. In addition: shoulder shrug is normal with equal shoulder height noted. Motor exam: Normal bulk, strength and tone is noted. There is no drift, tremor or rebound. Romberg is negative. Reflexes are 2+ throughout. Babinski: Toes are flexor bilaterally. Fine motor skills and coordination: intact with normal finger taps, normal hand movements, normal rapid alternating patting, normal foot taps and normal foot agility.  Cerebellar testing: No dysmetria or intention tremor on finger to nose testing. Heel to shin is unremarkable bilaterally. There is no truncal or gait ataxia.  Sensory exam: intact to light touch, pinprick, vibration, temperature sense in the upper and lower extremities.  Gait, station and balance: He stands easily. No veering to one side is noted. No leaning to one side is noted. Posture is age-appropriate and stance is narrow based. Gait shows normal stride length and normal pace. No problems turning are noted. He turns en bloc.   Assessment and Plan:   In summary, HERNDON GRILL is a very pleasant 81 y.o.-year old male with an underlying complex medical history of complete heart block, status post pacemaker placement in 2011, nonischemic cardiomyopathy, sleep apnea, depression, arthritis, prostate cancer, syncope, hyperlipidemia, diabetes, hypertension, reflux disease, lumbar spinal stenosis, hyperlipidemia, and coronary artery disease, who presented to [facility] emergency room on 04/12/2015 with new onset left facial weakness and he was diagnosed with Bell's palsy. I agree with the diagnosis. He  has no other focal neurologic findings thankfully. He had a head CT without contrast and I would like to proceed with a CT head with contrast. Of note, because of his pacemaker he cannot have a MRI which we would've preferred. He does report a history of left ear pain. This has improved. I do not see any herpetic lesions on his face or ear canal. He is advised to that he was given appropriate treatment symptomatically with steroids and an oral antiviral medication. This is more successful the sooner these medications are started. He does not need any new medications from my end of things at this time. We will check CMP to make sure his kidney and liver function is okay. We will call him with his CT results. I would like to see him routinely in 3 months, sooner if the need arises. I discussed Bell's palsy at length with the patient and his wife in detail. I explained to him that things improved with time. He is encouraged to do facial exercises and use lubricating eyedrops during the day and a lubricating eye ointment and an eye patch to protect his left eye at night. If he has any redness or pain in his eye he needs to see an eye doctor for fear of corneal abrasion or inflammation or infection. At this juncture, I have advised him that we will call him with his test results and I will see him routinely in 3 months, sooner if the need arises. I answered all her questions today and the patient and his wife were in agreement. I spent a total of 45 minutes with patient, we discussed not only his diagnosis of Bell's palsy but also his vertigo, his other medical problems, his CT head results, symptomatic treatments for vertigo and Bell's palsy. More than 50%  of our visit was spent in counseling and coordination of care. Thank you very much for allowing me to participate in the care of this nice patient. If I can be of any further assistance to you please do not hesitate to call me at  [phone].  Sincerely,   Star Age, MD, PhD

## 2015-04-27 LAB — COMPREHENSIVE METABOLIC PANEL
A/G RATIO: 1.7 (ref 1.1–2.5)
ALT: 17 IU/L (ref 0–44)
AST: 16 IU/L (ref 0–40)
Albumin: 4.2 g/dL (ref 3.5–4.7)
Alkaline Phosphatase: 74 IU/L (ref 39–117)
BUN / CREAT RATIO: 17 (ref 10–22)
BUN: 12 mg/dL (ref 8–27)
Bilirubin Total: 0.5 mg/dL (ref 0.0–1.2)
CALCIUM: 9.5 mg/dL (ref 8.6–10.2)
CO2: 24 mmol/L (ref 18–29)
CREATININE: 0.71 mg/dL — AB (ref 0.76–1.27)
Chloride: 94 mmol/L — ABNORMAL LOW (ref 97–108)
GFR, EST AFRICAN AMERICAN: 101 mL/min/{1.73_m2} (ref >59)
GFR, EST NON AFRICAN AMERICAN: 87 mL/min/{1.73_m2} (ref >59)
Globulin, Total: 2.5 g/dL (ref 1.5–4.5)
Glucose: 202 mg/dL — ABNORMAL HIGH (ref 65–99)
Potassium: 4.4 mmol/L (ref 3.5–5.2)
Sodium: 134 mmol/L (ref 134–144)
Total Protein: 6.7 g/dL (ref 6.0–8.5)

## 2015-04-28 DIAGNOSIS — L02212 Cutaneous abscess of back [any part, except buttock]: Secondary | ICD-10-CM | POA: Diagnosis not present

## 2015-04-28 DIAGNOSIS — L728 Other follicular cysts of the skin and subcutaneous tissue: Secondary | ICD-10-CM | POA: Diagnosis not present

## 2015-04-28 DIAGNOSIS — Z85828 Personal history of other malignant neoplasm of skin: Secondary | ICD-10-CM | POA: Diagnosis not present

## 2015-05-01 ENCOUNTER — Other Ambulatory Visit: Payer: Self-pay | Admitting: Neurology

## 2015-05-01 ENCOUNTER — Telehealth: Payer: Self-pay | Admitting: Neurology

## 2015-05-01 ENCOUNTER — Ambulatory Visit
Admission: RE | Admit: 2015-05-01 | Discharge: 2015-05-01 | Disposition: A | Discharge status: Home / Self Care | Payer: Medicare Other | Source: Ambulatory Visit | Attending: Neurology | Admitting: Neurology

## 2015-05-01 DIAGNOSIS — H8110 Benign paroxysmal vertigo, unspecified ear: Secondary | ICD-10-CM

## 2015-05-01 DIAGNOSIS — G510 Bell's palsy: Secondary | ICD-10-CM | POA: Diagnosis not present

## 2015-05-01 MED ORDER — IOHEXOL 300 MG/ML  SOLN
75.0000 mL | Freq: Once | INTRAMUSCULAR | Status: AC | PRN
  Administered 2015-05-01: 75 mL via INTRAVENOUS

## 2015-05-01 NOTE — Progress Notes (Signed)
Quick Note:  Please call patient regarding his blood test result. Sugar level was too high at 202. I would recommend that he get in touch with his primary care physician regarding blood sugar management. Star Age, MD, PhD [facility]  ______

## 2015-05-01 NOTE — Telephone Encounter (Signed)
I spoke with Danton Clap, put in new order per Dr. Rexene Alberts.

## 2015-05-01 NOTE — Telephone Encounter (Signed)
Alice from [facility] said that order needs to be changed - they do not do it the way it has been entered. Her number is [phone]. Patient's apt is at 12:30 so it needs to be changed ASAP.

## 2015-05-01 NOTE — Progress Notes (Signed)
Quick Note:  Please call patient regarding his head CT with contrast from 05/01/2015: No new changes, essentially no different from his prior head CT without contrast from 04/12/2015 which is reassuring. Star Age, MD, PhD [facility]  ______

## 2015-05-02 ENCOUNTER — Other Ambulatory Visit: Payer: Self-pay

## 2015-05-02 ENCOUNTER — Ambulatory Visit (INDEPENDENT_AMBULATORY_CARE_PROVIDER_SITE_OTHER): Payer: Medicare Other

## 2015-05-02 ENCOUNTER — Telehealth: Payer: Self-pay

## 2015-05-02 DIAGNOSIS — I429 Cardiomyopathy, unspecified: Secondary | ICD-10-CM

## 2015-05-02 DIAGNOSIS — I10 Essential (primary) hypertension: Secondary | ICD-10-CM

## 2015-05-02 NOTE — Telephone Encounter (Signed)
-----   Message from Star Age, MD sent at 05/01/2015  5:47 PM EDT ----- Please call patient regarding his head CT with contrast from 05/01/2015: No new changes, essentially no different from his prior head CT without contrast from 04/12/2015 which is reassuring. Star Age, MD, PhD [facility])

## 2015-05-02 NOTE — Telephone Encounter (Signed)
Left message to call back for results

## 2015-05-02 NOTE — Telephone Encounter (Signed)
I spoke to wife [name]) and she is aware of results. I also reiterated Dr. Guadelupe Sabin recommendation of facial exercises and taking care of affected eye. Wife voices understanding.

## 2015-05-02 NOTE — Telephone Encounter (Signed)
-----   Message from Star Age, MD sent at 05/01/2015  5:48 PM EDT ----- Please call patient regarding his blood test result. Sugar level was too high at 202. I would recommend that he get in touch with his primary care physician regarding blood sugar management. Star Age, MD, PhD [facility])

## 2015-05-09 DIAGNOSIS — E780 Pure hypercholesterolemia: Secondary | ICD-10-CM | POA: Diagnosis not present

## 2015-05-09 DIAGNOSIS — L728 Other follicular cysts of the skin and subcutaneous tissue: Secondary | ICD-10-CM | POA: Diagnosis not present

## 2015-05-09 DIAGNOSIS — E119 Type 2 diabetes mellitus without complications: Secondary | ICD-10-CM | POA: Diagnosis not present

## 2015-05-09 DIAGNOSIS — I10 Essential (primary) hypertension: Secondary | ICD-10-CM | POA: Diagnosis not present

## 2015-05-10 DIAGNOSIS — I10 Essential (primary) hypertension: Secondary | ICD-10-CM | POA: Diagnosis not present

## 2015-05-10 DIAGNOSIS — E780 Pure hypercholesterolemia: Secondary | ICD-10-CM | POA: Diagnosis not present

## 2015-05-10 DIAGNOSIS — E119 Type 2 diabetes mellitus without complications: Secondary | ICD-10-CM | POA: Diagnosis not present

## 2015-06-06 ENCOUNTER — Ambulatory Visit (INDEPENDENT_AMBULATORY_CARE_PROVIDER_SITE_OTHER): Payer: Medicare Other | Admitting: General Surgery

## 2015-06-06 ENCOUNTER — Encounter: Payer: Self-pay | Admitting: General Surgery

## 2015-06-06 VITALS — BP 132/78 | HR 66 | Resp 12 | Ht 69.0 in | Wt 183.0 lb

## 2015-06-06 DIAGNOSIS — L720 Epidermal cyst: Secondary | ICD-10-CM | POA: Diagnosis not present

## 2015-06-06 DIAGNOSIS — L728 Other follicular cysts of the skin and subcutaneous tissue: Secondary | ICD-10-CM | POA: Diagnosis not present

## 2015-06-06 DIAGNOSIS — L538 Other specified erythematous conditions: Secondary | ICD-10-CM | POA: Diagnosis not present

## 2015-06-06 DIAGNOSIS — L02212 Cutaneous abscess of back [any part, except buttock]: Secondary | ICD-10-CM | POA: Diagnosis not present

## 2015-06-06 DIAGNOSIS — R208 Other disturbances of skin sensation: Secondary | ICD-10-CM | POA: Diagnosis not present

## 2015-06-06 DIAGNOSIS — R58 Hemorrhage, not elsewhere classified: Secondary | ICD-10-CM | POA: Diagnosis not present

## 2015-06-06 DIAGNOSIS — L729 Follicular cyst of the skin and subcutaneous tissue, unspecified: Secondary | ICD-10-CM | POA: Diagnosis not present

## 2015-06-06 MED ORDER — DEXTROSE 5 % IV SOLN: 1.0000 g | Freq: Once | INTRAVENOUS | Status: DC

## 2015-06-06 NOTE — Patient Instructions (Signed)
Patient is scheduled for surgery at [facility] on 06/07/15. He will arrive at Pre Admit testing on 06/07/15 at 9:00 am. He will have nothing to eat or drink after midnight. He will hold his Metformin and Aspirin today. He will only take his Amlodipine, lisinopril, and Omeprazole tomorrow at 6 am with a small sip of water. Patient is aware of date, time, and instructions.

## 2015-06-06 NOTE — H&P (Signed)
Patient ID: Howard [name], male   DOB: 06-Jun-1931, 81 y.o.   MRN: [id number]  Chief Complaint  Patient presents with  . Cyst    back mass    HPI Howard [name] is a 81 y.o. male.  Here today to evaluate a mass in left lower back below left scapula. This was supposed to be removed this morning by Dr. Marolyn Hammock which had to stop due to the size.  The mass was noticed 6 weeks ago was treated with anitbitoics, with recent identification of Proteus. The area had a 1 cm raised ridge with purulent pustules prior to today's debridement at the dermatology office. Previous incision and drainage had been completed earlier today but felt to be incomplete by phone report. The patient was accompanied today by his wife of 67 years, and as well as his son, [name].    HPI  Past Medical History  Diagnosis Date  . Hyperlipidemia   . Hypertension   . Complete heart block     a. 11/2010 s/p SJM 2210 [facility] DC PPM, ser# [id number].  . WPW (Wolff-Parkinson-White syndrome)     a. S/P RFCA 1991.  . Sleep apnea     a. cpap  . Diabetes mellitus without complication   . LBBB (left bundle branch block)   . GERD (gastroesophageal reflux disease)   . Arthritis   . Cancer     prostate and skin  . NICM (nonischemic cardiomyopathy)     a. 07/2014 Echo: EF 35-40%, mid-apicalanteroseptal DK, Gr 1 DD, mild-mod dil LA.  Marland Kitchen Chronic combined systolic and diastolic CHF, NYHA class 1     a. 07/2014 Echo: EF 35-40%, Gr 1 DD.  Marland Kitchen Vertigo   . Depression   . Non-obstructive CAD     a. 07/2014 Abnl MV;  b. 08/2014 Cath: LM nl, LAD 30p, RI 40p, LCX nl, OM1 40, RCA dominant 30p, 70d-->Med Rx.  . Left-sided Bell's palsy   . Poor balance   . Fall 11-10-14    Past Surgical History  Procedure Laterality Date  . Ruptured disc      1962 and 1998  . Cervical fusion    . Knee surgery      left knee 1991 and 1992; right knee 1995  . Prostate surgery      cancer--1998  . Replacement total knee      2004  . Hand surgery      right 1993;  left 2005  . Back surgery      2011  . Pacemaker insertion      PPM-- St Jude 11/30/10 by [name]  . Catheter ablation  1991    for WPW  . Joint replacement Left 2013    knee  . Joint replacement Right 2004    knee  . Lumbar laminectomy/decompression microdiscectomy N/A 06/07/2014    Procedure: LUMBAR FOUR TO FIVE LUMBAR LAMINECTOMY/DECOMPRESSION MICRODISCECTOMY 1 LEVEL;  Surgeon: [name], MD;  Location: [place] [facility];  Service: Neurosurgery;  Laterality: N/A;  . Insert / replace / remove pacemaker    . Cardiac catheterization  08/26/2014    Single vessel obstructive CAD  . Left heart catheterization with coronary angiogram N/A 08/26/2014    Procedure: LEFT HEART CATHETERIZATION WITH CORONARY ANGIOGRAM;  Surgeon: [name], MD;  Location: [facility] CATH LAB;  Service: Cardiovascular;  Laterality: N/A;  . Cataract extraction  07-31-11 and 09-18-11  . Hernia repair  1955  . Cholecystectomy  09-07-14  . Trigger finger release  01-24-15  . Carpal tunnel release  04-04-15    [facility]    Family History  Problem Relation Age of Onset  . CAD    . Heart attack Mother   . Hyperlipidemia Mother     Social History History  Substance Use Topics  . Smoking status: Former Smoker -- 4 years  . Smokeless tobacco: Never Used     Comment: Quit 2011  . Alcohol Use: No     Comment: occasional    Allergies  Allergen Reactions  . Sulfonamide Derivatives Rash    Current Outpatient Prescriptions  Medication Sig Dispense Refill  . amLODipine (NORVASC) 5 MG tablet Take 1 tablet (5 mg total) by mouth daily. 90 tablet 3  . aspirin 81 MG tablet Take 81 mg by mouth daily.      . Cholecalciferol (VITAMIN D3) 1000 UNITS CAPS Take 1,000 Units by mouth daily.     . Colesevelam HCl (WELCHOL) 3.75 G PACK Take 3.75 g by mouth daily. Mix in [place] of water and drink.    . hydrochlorothiazide 25 MG tablet Take 1 tablet (25 mg total) by mouth daily. 90 tablet 3  . lisinopril (PRINIVIL,ZESTRIL) 10 MG tablet Take 1 tablet (10  mg total) by mouth daily. 90 tablet 3  . metFORMIN (GLUCOPHAGE) 500 MG tablet Take 1 tablet by mouth daily.    . metoprolol succinate (TOPROL-XL) 25 MG 24 hr tablet Take 25 mg by mouth every evening.    . Multiple Vitamin (MULTIVITAMIN) capsule Take 1 capsule by mouth daily.      Marland Kitchen omeprazole (PRILOSEC) 40 MG capsule Take 40 mg by mouth 2 (two) times daily.    . simvastatin (ZOCOR) 40 MG tablet Take 1 tablet (40 mg total) by mouth at bedtime. 90 tablet 3   No current facility-administered medications for this visit.    Review of Systems Review of Systems  Constitutional: Negative.   Respiratory: Negative.   Cardiovascular: Negative.     Blood pressure 132/78, pulse 66, resp. rate 12, height '5\' 9"'$  (1.753 m), weight 183 lb (83.008 kg).  Physical Exam Physical Exam  Constitutional: He is oriented to person, place, and time. He appears well-developed and well-nourished.  Cardiovascular: Normal rate, regular rhythm and normal heart sounds.   Normal speech, no evidence of dyspnea or shortness of breath.  Pulmonary/Chest: Effort normal and breath sounds normal.    Abdominal: A hernia is present.    Above navel and beside  Lymphadenopathy:    He has no axillary adenopathy.  Neurological: He is alert and oriented to person, place, and time.  Skin: Skin is warm and dry.    Data Reviewed Phone report from Dr. Marolyn Hammock, dermatology. Proteus reported on recent culture sensitive to Augmentin.  Assessment    Extensive inflammatory process of the left back secondary to an infected sebaceous cyst. Umbilical hernia, clinically asymptomatic present.    Plan    This area will be best managed by operative debridement and wound VAC placement. The patient will be nothing by mouth for solid foods after midnight, nothing by mouth completely after 6 AM tomorrow morning. He has been instructed to take his normal morning medications except as noted below.     Patient is scheduled for surgery at  [facility] on 06/07/15. He will arrive at Pre Admit testing on 06/07/15 at 9:00 am.  He will hold his Metformin and Aspirin today. He will only take his Amlodipine, lisinopril, and Omeprazole tomorrow at 6 am with a small sip  of water. Patient is aware of date, time, and instructions.   PCP:  [place], Lianne Bushy, Forest Gleason

## 2015-06-06 NOTE — Progress Notes (Signed)
Patient ID: Howard [name], male   DOB: 08/15/1931, 81 y.o.   MRN: [id number]  Chief Complaint  Patient presents with  . Cyst    back mass    HPI Howard [name] is a 81 y.o. male.  Here today to evaluate a mass in left lower back below left scapula. This was supposed to be removed this morning by Dr. Marolyn Hammock which had to stop due to the size.  The mass was noticed 6 weeks ago was treated with anitbitoics, with recent identification of Proteus. The area had a 1 cm raised ridge with purulent pustules prior to today's debridement at the dermatology office. Previous incision and drainage had been completed earlier today but felt to be incomplete by phone report. The patient was accompanied today by his wife of 70 years, and as well as his son, [name].    HPI  Past Medical History  Diagnosis Date  . Hyperlipidemia   . Hypertension   . Complete heart block     a. 11/2010 s/p SJM 2210 [facility] DC PPM, ser# [id number].  . WPW (Wolff-Parkinson-White syndrome)     a. S/P RFCA 1991.  . Sleep apnea     a. cpap  . Diabetes mellitus without complication   . LBBB (left bundle branch block)   . GERD (gastroesophageal reflux disease)   . Arthritis   . Cancer     prostate and skin  . NICM (nonischemic cardiomyopathy)     a. 07/2014 Echo: EF 35-40%, mid-apicalanteroseptal DK, Gr 1 DD, mild-mod dil LA.  Marland Kitchen Chronic combined systolic and diastolic CHF, NYHA class 1     a. 07/2014 Echo: EF 35-40%, Gr 1 DD.  Marland Kitchen Vertigo   . Depression   . Non-obstructive CAD     a. 07/2014 Abnl MV;  b. 08/2014 Cath: LM nl, LAD 30p, RI 40p, LCX nl, OM1 40, RCA dominant 30p, 70d-->Med Rx.  . Left-sided Bell's palsy   . Poor balance   . Fall 11-10-14    Past Surgical History  Procedure Laterality Date  . Ruptured disc      1962 and 1998  . Cervical fusion    . Knee surgery      left knee 1991 and 1992; right knee 1995  . Prostate surgery      cancer--1998  . Replacement total knee      2004  . Hand surgery      right 1993;  left 2005  . Back surgery      2011  . Pacemaker insertion      PPM-- St Jude 11/30/10 by [name]  . Catheter ablation  1991    for WPW  . Joint replacement Left 2013    knee  . Joint replacement Right 2004    knee  . Lumbar laminectomy/decompression microdiscectomy N/A 06/07/2014    Procedure: LUMBAR FOUR TO FIVE LUMBAR LAMINECTOMY/DECOMPRESSION MICRODISCECTOMY 1 LEVEL;  Surgeon: [name], MD;  Location: [place] [facility];  Service: Neurosurgery;  Laterality: N/A;  . Insert / replace / remove pacemaker    . Cardiac catheterization  08/26/2014    Single vessel obstructive CAD  . Left heart catheterization with coronary angiogram N/A 08/26/2014    Procedure: LEFT HEART CATHETERIZATION WITH CORONARY ANGIOGRAM;  Surgeon: [name], MD;  Location: [facility], [place] CATH LAB;  Service: Cardiovascular;  Laterality: N/A;  . Cataract extraction  07-31-11 and 09-18-11  . Hernia repair  1955  . Cholecystectomy  09-07-14  . Trigger finger release  01-24-15  . Carpal tunnel release  04-04-15    [facility]    Family History  Problem Relation Age of Onset  . CAD    . Heart attack Mother   . Hyperlipidemia Mother     Social History History  Substance Use Topics  . Smoking status: Former Smoker -- 4 years  . Smokeless tobacco: Never Used     Comment: Quit 2011  . Alcohol Use: No     Comment: occasional    Allergies  Allergen Reactions  . Sulfonamide Derivatives Rash    Current Outpatient Prescriptions  Medication Sig Dispense Refill  . amLODipine (NORVASC) 5 MG tablet Take 1 tablet (5 mg total) by mouth daily. 90 tablet 3  . aspirin 81 MG tablet Take 81 mg by mouth daily.      . Cholecalciferol (VITAMIN D3) 1000 UNITS CAPS Take 1,000 Units by mouth daily.     . Colesevelam HCl (WELCHOL) 3.75 G PACK Take 3.75 g by mouth daily. Mix in [place] of water and drink.    . hydrochlorothiazide 25 MG tablet Take 1 tablet (25 mg total) by mouth daily. 90 tablet 3  . lisinopril (PRINIVIL,ZESTRIL) 10 MG tablet Take 1 tablet (10  mg total) by mouth daily. 90 tablet 3  . metFORMIN (GLUCOPHAGE) 500 MG tablet Take 1 tablet by mouth daily.    . metoprolol succinate (TOPROL-XL) 25 MG 24 hr tablet Take 25 mg by mouth every evening.    . Multiple Vitamin (MULTIVITAMIN) capsule Take 1 capsule by mouth daily.      Marland Kitchen omeprazole (PRILOSEC) 40 MG capsule Take 40 mg by mouth 2 (two) times daily.    . simvastatin (ZOCOR) 40 MG tablet Take 1 tablet (40 mg total) by mouth at bedtime. 90 tablet 3   No current facility-administered medications for this visit.    Review of Systems Review of Systems  Constitutional: Negative.   Respiratory: Negative.   Cardiovascular: Negative.     Blood pressure 132/78, pulse 66, resp. rate 12, height '5\' 9"'$  (1.753 m), weight 183 lb (83.008 kg).  Physical Exam Physical Exam  Constitutional: He is oriented to person, place, and time. He appears well-developed and well-nourished.  Cardiovascular: Normal rate, regular rhythm and normal heart sounds.   Normal speech, no evidence of dyspnea or shortness of breath.  Pulmonary/Chest: Effort normal and breath sounds normal.    Abdominal: A hernia is present.    Above navel and beside  Lymphadenopathy:    He has no axillary adenopathy.  Neurological: He is alert and oriented to person, place, and time.  Skin: Skin is warm and dry.    Data Reviewed Phone report from Dr. Marolyn Hammock, dermatology. Proteus reported on recent culture sensitive to Augmentin.  Assessment    Extensive inflammatory process of the left back secondary to an infected sebaceous cyst. Umbilical hernia, clinically asymptomatic present.    Plan    This area will be best managed by operative debridement and wound VAC placement. The patient will be nothing by mouth for solid foods after midnight, nothing by mouth completely after 6 AM tomorrow morning. He has been instructed to take his normal morning medications except as noted below.     Patient is scheduled for surgery at  [facility] on 06/07/15. He will arrive at Pre Admit testing on 06/07/15 at 9:00 am.  He will hold his Metformin and Aspirin today. He will only take his Amlodipine, lisinopril, and Omeprazole tomorrow at 6 am with a small sip  of water. Patient is aware of date, time, and instructions.   PCP:  Barbie Haggis 06/06/2015, 9:02 PM

## 2015-06-07 ENCOUNTER — Encounter
Admission: RE | Disposition: A | Discharge status: Home / Self Care | Payer: Self-pay | Source: Ambulatory Visit | Attending: General Surgery

## 2015-06-07 ENCOUNTER — Ambulatory Visit: Payer: Medicare Other | Admitting: Certified Registered Nurse Anesthetist

## 2015-06-07 ENCOUNTER — Encounter: Payer: Self-pay | Admitting: *Deleted

## 2015-06-07 ENCOUNTER — Other Ambulatory Visit: Payer: Self-pay

## 2015-06-07 ENCOUNTER — Ambulatory Visit
Admission: RE | Admit: 2015-06-07 | Discharge: 2015-06-07 | Disposition: A | Discharge status: Home / Self Care | Payer: Medicare Other | Source: Ambulatory Visit | Attending: General Surgery | Admitting: General Surgery

## 2015-06-07 DIAGNOSIS — I2510 Atherosclerotic heart disease of native coronary artery without angina pectoris: Secondary | ICD-10-CM | POA: Diagnosis not present

## 2015-06-07 DIAGNOSIS — I509 Heart failure, unspecified: Secondary | ICD-10-CM | POA: Diagnosis not present

## 2015-06-07 DIAGNOSIS — Z9849 Cataract extraction status, unspecified eye: Secondary | ICD-10-CM | POA: Diagnosis not present

## 2015-06-07 DIAGNOSIS — Z87891 Personal history of nicotine dependence: Secondary | ICD-10-CM | POA: Diagnosis not present

## 2015-06-07 DIAGNOSIS — I456 Pre-excitation syndrome: Secondary | ICD-10-CM | POA: Insufficient documentation

## 2015-06-07 DIAGNOSIS — Z96653 Presence of artificial knee joint, bilateral: Secondary | ICD-10-CM | POA: Insufficient documentation

## 2015-06-07 DIAGNOSIS — E119 Type 2 diabetes mellitus without complications: Secondary | ICD-10-CM | POA: Diagnosis not present

## 2015-06-07 DIAGNOSIS — M1990 Unspecified osteoarthritis, unspecified site: Secondary | ICD-10-CM | POA: Insufficient documentation

## 2015-06-07 DIAGNOSIS — Z882 Allergy status to sulfonamides status: Secondary | ICD-10-CM | POA: Diagnosis not present

## 2015-06-07 DIAGNOSIS — Z85828 Personal history of other malignant neoplasm of skin: Secondary | ICD-10-CM | POA: Insufficient documentation

## 2015-06-07 DIAGNOSIS — Z8546 Personal history of malignant neoplasm of prostate: Secondary | ICD-10-CM | POA: Diagnosis not present

## 2015-06-07 DIAGNOSIS — I442 Atrioventricular block, complete: Secondary | ICD-10-CM | POA: Diagnosis not present

## 2015-06-07 DIAGNOSIS — I447 Left bundle-branch block, unspecified: Secondary | ICD-10-CM | POA: Diagnosis not present

## 2015-06-07 DIAGNOSIS — Z7982 Long term (current) use of aspirin: Secondary | ICD-10-CM | POA: Diagnosis not present

## 2015-06-07 DIAGNOSIS — K219 Gastro-esophageal reflux disease without esophagitis: Secondary | ICD-10-CM | POA: Diagnosis not present

## 2015-06-07 DIAGNOSIS — L02212 Cutaneous abscess of back [any part, except buttock]: Secondary | ICD-10-CM | POA: Diagnosis not present

## 2015-06-07 DIAGNOSIS — I10 Essential (primary) hypertension: Secondary | ICD-10-CM | POA: Diagnosis not present

## 2015-06-07 DIAGNOSIS — I5042 Chronic combined systolic (congestive) and diastolic (congestive) heart failure: Secondary | ICD-10-CM | POA: Insufficient documentation

## 2015-06-07 DIAGNOSIS — G4730 Sleep apnea, unspecified: Secondary | ICD-10-CM | POA: Diagnosis not present

## 2015-06-07 DIAGNOSIS — F329 Major depressive disorder, single episode, unspecified: Secondary | ICD-10-CM | POA: Diagnosis not present

## 2015-06-07 DIAGNOSIS — L03312 Cellulitis of back [any part except buttock]: Secondary | ICD-10-CM | POA: Insufficient documentation

## 2015-06-07 DIAGNOSIS — Z8249 Family history of ischemic heart disease and other diseases of the circulatory system: Secondary | ICD-10-CM | POA: Diagnosis not present

## 2015-06-07 DIAGNOSIS — Z8489 Family history of other specified conditions: Secondary | ICD-10-CM | POA: Insufficient documentation

## 2015-06-07 DIAGNOSIS — R42 Dizziness and giddiness: Secondary | ICD-10-CM | POA: Insufficient documentation

## 2015-06-07 DIAGNOSIS — Z79899 Other long term (current) drug therapy: Secondary | ICD-10-CM | POA: Insufficient documentation

## 2015-06-07 DIAGNOSIS — Z950 Presence of cardiac pacemaker: Secondary | ICD-10-CM | POA: Insufficient documentation

## 2015-06-07 DIAGNOSIS — I428 Other cardiomyopathies: Secondary | ICD-10-CM | POA: Insufficient documentation

## 2015-06-07 DIAGNOSIS — E785 Hyperlipidemia, unspecified: Secondary | ICD-10-CM | POA: Diagnosis not present

## 2015-06-07 DIAGNOSIS — Z9049 Acquired absence of other specified parts of digestive tract: Secondary | ICD-10-CM | POA: Insufficient documentation

## 2015-06-07 HISTORY — PX: WOUND DEBRIDEMENT: SHX247

## 2015-06-07 HISTORY — PX: APPLICATION OF WOUND VAC: SHX5189

## 2015-06-07 LAB — CBC WITH DIFFERENTIAL/PLATELET
BASOS PCT: 1 %
Basophils Absolute: 0 10*3/uL (ref 0–0.1)
EOS PCT: 7 %
Eosinophils Absolute: 0.4 10*3/uL (ref 0–0.7)
HCT: 38.1 % — ABNORMAL LOW (ref 40.0–52.0)
Hemoglobin: 12.5 g/dL — ABNORMAL LOW (ref 13.0–18.0)
Lymphocytes Relative: 15 %
Lymphs Abs: 0.9 10*3/uL — ABNORMAL LOW (ref 1.0–3.6)
MCH: 26.3 pg (ref 26.0–34.0)
MCHC: 32.7 g/dL (ref 32.0–36.0)
MCV: 80.3 fL (ref 80.0–100.0)
MONO ABS: 0.7 10*3/uL (ref 0.2–1.0)
Monocytes Relative: 13 %
Neutro Abs: 3.6 10*3/uL (ref 1.4–6.5)
Neutrophils Relative %: 64 %
Platelets: 301 10*3/uL (ref 150–440)
RBC: 4.74 MIL/uL (ref 4.40–5.90)
RDW: 15.6 % — AB (ref 11.5–14.5)
WBC: 5.6 10*3/uL (ref 3.8–10.6)

## 2015-06-07 LAB — GLUCOSE, CAPILLARY: GLUCOSE-CAPILLARY: 134 mg/dL — AB (ref 65–99)

## 2015-06-07 SURGERY — DEBRIDEMENT, WOUND
Anesthesia: General | Laterality: Left | Wound class: Clean Contaminated

## 2015-06-07 MED ORDER — FENTANYL CITRATE (PF) 100 MCG/2ML IJ SOLN
25.0000 ug | INTRAMUSCULAR | Status: DC | PRN
  Administered 2015-06-07 (×2): 25 ug via INTRAVENOUS

## 2015-06-07 MED ORDER — BUPIVACAINE HCL (PF) 0.5 % IJ SOLN
INTRAMUSCULAR | Status: AC
  Filled 2015-06-07: qty 30

## 2015-06-07 MED ORDER — LIDOCAINE HCL 1 % IJ SOLN
INTRAMUSCULAR | Status: DC | PRN
  Administered 2015-06-07: 20 mL

## 2015-06-07 MED ORDER — PROPOFOL INFUSION 10 MG/ML OPTIME
INTRAVENOUS | Status: DC | PRN
  Administered 2015-06-07: 50 ug/kg/min via INTRAVENOUS

## 2015-06-07 MED ORDER — SODIUM CHLORIDE 0.9 % IV SOLN
INTRAVENOUS | Status: DC
  Administered 2015-06-07: 50 mL/h via INTRAVENOUS

## 2015-06-07 MED ORDER — ONDANSETRON HCL 4 MG/2ML IJ SOLN: 4.0000 mg | Freq: Once | INTRAMUSCULAR | Status: DC | PRN

## 2015-06-07 MED ORDER — BUPIVACAINE HCL 0.5 % IJ SOLN
INTRAMUSCULAR | Status: DC | PRN
  Administered 2015-06-07: 30 mL

## 2015-06-07 MED ORDER — LIDOCAINE HCL (PF) 1 % IJ SOLN
INTRAMUSCULAR | Status: AC
  Filled 2015-06-07: qty 30

## 2015-06-07 MED ORDER — FENTANYL CITRATE (PF) 100 MCG/2ML IJ SOLN
INTRAMUSCULAR | Status: DC | PRN
  Administered 2015-06-07: 25 ug via INTRAVENOUS
  Administered 2015-06-07: 50 ug via INTRAVENOUS
  Administered 2015-06-07: 25 ug via INTRAVENOUS

## 2015-06-07 MED ORDER — CEFTRIAXONE SODIUM IN DEXTROSE 20 MG/ML IV SOLN
1.0000 g | Freq: Once | INTRAVENOUS | Status: AC
  Administered 2015-06-07: 1 g via INTRAVENOUS
  Filled 2015-06-07 (×2): qty 50

## 2015-06-07 MED ORDER — FENTANYL CITRATE (PF) 100 MCG/2ML IJ SOLN
INTRAMUSCULAR | Status: AC
  Administered 2015-06-07: 25 ug via INTRAVENOUS
  Filled 2015-06-07: qty 2

## 2015-06-07 MED ORDER — HYDROCODONE-ACETAMINOPHEN 5-325 MG PO TABS: 1.0000 | ORAL_TABLET | ORAL | Status: DC | PRN

## 2015-06-07 MED ORDER — MIDAZOLAM HCL 2 MG/2ML IJ SOLN
INTRAMUSCULAR | Status: DC | PRN
  Administered 2015-06-07 (×2): 1 mg via INTRAVENOUS

## 2015-06-07 SURGICAL SUPPLY — 30 items
BLADE SURG 15 STRL SS SAFETY (BLADE) ×4 IMPLANT
CANISTER SUCT 1200ML W/VALVE (MISCELLANEOUS) ×2 IMPLANT
CHLORAPREP W/TINT 26ML (MISCELLANEOUS) IMPLANT
DRAPE LAPAROTOMY 100X77 ABD (DRAPES) ×2 IMPLANT
DRESSING TELFA 4X3 1S ST N-ADH (GAUZE/BANDAGES/DRESSINGS) ×2 IMPLANT
DRSG TEGADERM 4X4.75 (GAUZE/BANDAGES/DRESSINGS) IMPLANT
GAUZE SPONGE 4X4 12PLY STRL (GAUZE/BANDAGES/DRESSINGS) IMPLANT
GLOVE BIO SURGEON STRL SZ7.5 (GLOVE) ×4 IMPLANT
GLOVE INDICATOR 8.0 STRL GRN (GLOVE) ×4 IMPLANT
GOWN STRL REUS W/ TWL LRG LVL3 (GOWN DISPOSABLE) ×2 IMPLANT
GOWN STRL REUS W/TWL LRG LVL3 (GOWN DISPOSABLE) ×2
KIT DRSG VAC SLVR GRANUFM (MISCELLANEOUS) ×2 IMPLANT
KIT RM TURNOVER STRD PROC AR (KITS) ×2 IMPLANT
LABEL OR SOLS (LABEL) ×2 IMPLANT
NEEDLE SPNL 22GX3.5 SPINOC (NEEDLE) ×2 IMPLANT
PACK BASIN MINOR ARMC (MISCELLANEOUS) ×2 IMPLANT
PAD GROUND ADULT SPLIT (MISCELLANEOUS) ×2 IMPLANT
SPONGE LAP 18X18 5 PK (GAUZE/BANDAGES/DRESSINGS) ×2 IMPLANT
STRIP CLOSURE SKIN 1/2X4 (GAUZE/BANDAGES/DRESSINGS) IMPLANT
SUT ETHILON 4-0 (SUTURE) ×1
SUT ETHILON 4-0 FS2 18XMFL BLK (SUTURE) ×1
SUT VIC AB 2-0 CT1 (SUTURE) ×2 IMPLANT
SUT VIC AB 3-0 SH 27 (SUTURE) ×1
SUT VIC AB 3-0 SH 27X BRD (SUTURE) ×1 IMPLANT
SUT VIC AB 4-0 FS2 27 (SUTURE) ×2 IMPLANT
SUT VICRYL+ 3-0 144IN (SUTURE) IMPLANT
SUTURE ETHLN 4-0 FS2 18XMF BLK (SUTURE) ×1 IMPLANT
SWAB DUAL CULTURE TRANS RED ST (MISCELLANEOUS) ×2 IMPLANT
SWABSTK COMLB BENZOIN TINCTURE (MISCELLANEOUS) IMPLANT
SYRINGE 10CC LL (SYRINGE) ×2 IMPLANT

## 2015-06-07 NOTE — H&P (Signed)
No change in clinical condition.Good pain relief w/ Vicodan provided by dermatologist. Wife did not need to reinforce dressing.  For debridement, wound vac today.

## 2015-06-07 NOTE — Transfer of Care (Signed)
Immediate Anesthesia Transfer of Care Note  Patient: Howard [name]  Procedure(s) Performed: Procedure(s) with comments: DEBRIDEMENT WOUND (Left) - left upper back APPLICATION OF WOUND VAC (Left) - left upper back  Patient Location: PACU  Anesthesia Type:General  Level of Consciousness: awake and patient cooperative  Airway & Oxygen Therapy: Patient Spontanous Breathing and Patient connected to face mask oxygen  Post-op Assessment: Report given to RN  Post vital signs: Reviewed and stable  Last Vitals:  Filed Vitals:   06/07/15 1158  BP: 135/67  Pulse: 77  Temp: 97.7  Resp: 16    Complications: No apparent anesthesia complications

## 2015-06-07 NOTE — Discharge Instructions (Signed)
Resume metformin tomorrow, Thursday, June 16th.  Tylenol if needed for soreness. Hydrocodone as previously prescribed if needed for pain.  Keep wound VAC unit dry.AMBULATORY SURGERY  DISCHARGE INSTRUCTIONS   1) The drugs that you were given will stay in your system until tomorrow so for the next 24 hours you should not:  A) Drive an automobile B) Make any legal decisions C) Drink any alcoholic beverage   2) You may resume regular meals tomorrow.  Today it is better to start with liquids and gradually work up to solid foods.  You may eat anything you prefer, but it is better to start with liquids, then soup and crackers, and gradually work up to solid foods.   3) Please notify your doctor immediately if you have any unusual bleeding, trouble breathing, redness and pain at the surgery site, drainage, fever, or pain not relieved by medication.   4) Your post-operative visit with Dr.   Bary Castilla will call to make arrangements                                  is: Date:

## 2015-06-07 NOTE — Anesthesia Preprocedure Evaluation (Addendum)
Anesthesia Evaluation  Patient identified by MRN, date of birth, ID band  Reviewed: Allergy & Precautions, NPO status , Patient's Chart, lab work & pertinent test results, reviewed documented beta blocker date and time   Airway Mallampati: II  TM Distance: >3 FB Neck ROM: Full    Dental  (+) Chipped   Pulmonary sleep apnea , former smoker,  breath sounds clear to auscultation  Pulmonary exam normal       Cardiovascular hypertension, + CAD and +CHF + dysrhythmias + pacemaker Rhythm:Regular  100% paced   Neuro/Psych PSYCHIATRIC DISORDERS Depression  Neuromuscular disease    GI/Hepatic Neg liver ROS, GERD-  ,  Endo/Other  diabetes, Well Controlled, Type 2  Renal/GU negative Renal ROS  negative genitourinary   Musculoskeletal  (+) Arthritis -, Osteoarthritis,    Abdominal Normal abdominal exam  (+)   Peds negative pediatric ROS (+)  Hematology negative hematology ROS (+)   Anesthesia Other Findings   Reproductive/Obstetrics                            Anesthesia Physical Anesthesia Plan  ASA: IV  Anesthesia Plan: General   Post-op Pain Management:    Induction: Intravenous  Airway Management Planned: Nasal Cannula  Additional Equipment:   Intra-op Plan:   Post-operative Plan:   Informed Consent: I have reviewed the patients History and Physical, chart, labs and discussed the procedure including the risks, benefits and alternatives for the proposed anesthesia with the patient or authorized representative who has indicated his/her understanding and acceptance.   Dental advisory given  Plan Discussed with: CRNA and Surgeon  Anesthesia Plan Comments: (TIVA)       Anesthesia Quick Evaluation

## 2015-06-07 NOTE — Op Note (Signed)
Preoperative diagnosis: Left back abscess.  Postoperative diagnosis: Same.  Operative procedure: Debridement left back abscess, application of wound VAC.  Operating surgeon: Ollen Bowl, M.D.  Anesthesia: Attended local: 60 mL 0.5% Xylocaine with 0.25% Marcaine, plain.  Estimated blood loss 25 mL.  Clinical note this 81 year old male is been treated for an infected sebaceous cyst for several weeks. Debridement and drainage was undertaken yesterday in the dermatology office with a large defect noted and evidence of recurrent devitalized tissue which was felt to be best managed with operative debridement. The patient had previously grown Proteus as a solo organism from the abscess cavity. He received Rocephin today prior to the procedure.  Operative note the patient was placed in the right lateral decubitus position and supported with a beanbag and pillows. The area was prepped with Betadine solution after a new culture was obtained. The above mentioned local and a static was instilled. The area was debridement with a curet to healthy bleeding tissue. Hemostasis was with electrocautery. A wound VAC sponge was placed after an Adaptic was placed into the base of the wound. Occlusive plastic dressing applied and negative pressure at 125 mmHg established.  The patient tolerated the procedure well was taken to the recovery room stable condition.

## 2015-06-07 NOTE — Anesthesia Postprocedure Evaluation (Signed)
  Anesthesia Post-op Note  Patient: Howard [name]  Procedure(s) Performed: Procedure(s) with comments: DEBRIDEMENT WOUND (Left) - left upper back APPLICATION OF WOUND VAC (Left) - left upper back  Anesthesia type:General  Patient location: PACU  Post pain: Pain level controlled  Post assessment: Post-op Vital signs reviewed, Patient's Cardiovascular Status Stable, Respiratory Function Stable, Patent Airway and No signs of Nausea or vomiting  Post vital signs: Reviewed and stable  Last Vitals:  Filed Vitals:   06/07/15 1353  BP: 145/66  Pulse: 92  Temp:   Resp: 18    Level of consciousness: awake, alert  and patient cooperative  Complications: No apparent anesthesia complications

## 2015-06-08 ENCOUNTER — Telehealth: Payer: Self-pay | Admitting: *Deleted

## 2015-06-08 LAB — SURGICAL PATHOLOGY

## 2015-06-08 NOTE — Telephone Encounter (Signed)
Patients wife is calling you back, she said to call her sooner than later because she has to leave the house this afternoon.

## 2015-06-08 NOTE — Telephone Encounter (Signed)
-----   Message from Robert Bellow, MD sent at 06/08/2015  8:52 AM EDT ----- Please contact the patient and see how he is doing after wound debridement and wound VAC application yesterday. I will be contacting him Saturday to arrange for an office visit that day for dressing change. Remind him that he'll need to bring the supplies that were provided by the wound VAC representative to the appointment. Also, find out where he lives in it may actually be easier for me to make a home visit. Thank you

## 2015-06-08 NOTE — Telephone Encounter (Signed)
Dr Bary Castilla will be contacting them on Saturday.

## 2015-06-10 ENCOUNTER — Encounter: Payer: Self-pay | Admitting: General Surgery

## 2015-06-10 ENCOUNTER — Ambulatory Visit: Payer: Medicare Other | Admitting: General Surgery

## 2015-06-10 DIAGNOSIS — L03312 Cellulitis of back [any part except buttock]: Secondary | ICD-10-CM | POA: Diagnosis not present

## 2015-06-10 NOTE — Progress Notes (Signed)
The patient was seen at his home 3 days after debridement of an extensive left back abscess. A wound VAC was applied at the time of debridement. He's done very well, having minimal pain and fairly comfortable.  The old dressing was removed. Less than 30 mL of drainage recorded. There is still surrounding erythema measuring less than 2 cm in diameter (30-50% improved) but the wound itself is markedly improved. This now measures about 4 x 5 cm by less than 2 cm in depth. This is also at least 30% improved. The base is clean, there is no odor and healthy granulation tissue is present.  The wound was cleaned with saline and a new, slightly smaller wound VAC dressing applied. Good seal was obtained.  The patient's getting around quite well.  Previous culture showed evidence of some Proteus. Intraoperative cultures are no growth at this time. Gram-negative rods were identified.  We'll plan to have the patient, the office on Monday, June 20 for dressing change.

## 2015-06-11 LAB — ANAEROBIC CULTURE

## 2015-06-12 ENCOUNTER — Ambulatory Visit (INDEPENDENT_AMBULATORY_CARE_PROVIDER_SITE_OTHER): Payer: Medicare Other | Admitting: General Surgery

## 2015-06-12 ENCOUNTER — Encounter: Payer: Self-pay | Admitting: General Surgery

## 2015-06-12 VITALS — BP 120/60 | HR 72 | Resp 12 | Ht 69.0 in | Wt 179.0 lb

## 2015-06-12 DIAGNOSIS — L02212 Cutaneous abscess of back [any part, except buttock]: Secondary | ICD-10-CM | POA: Diagnosis not present

## 2015-06-12 NOTE — Progress Notes (Signed)
Patient ID: Howard [name], male   DOB: [birth year], 81 y.o.   MRN: [id number]  Chief Complaint  Patient presents with  . Follow-up    wound vac dressing change    HPI Howard [name] is a 81 y.o. male here today for his wound vac dressing change.  HPI  Past Medical History  Diagnosis Date  . Hyperlipidemia   . Hypertension   . Complete heart block     a. 11/2010 s/p SJM 2210 [facility] DC PPM, ser# [id number].  . WPW (Wolff-Parkinson-White syndrome)     a. S/P RFCA 1991.  . Sleep apnea     a. cpap  . Diabetes mellitus without complication   . LBBB (left bundle branch block)   . GERD (gastroesophageal reflux disease)   . Arthritis   . Cancer     prostate and skin  . NICM (nonischemic cardiomyopathy)     a. 07/2014 Echo: EF 35-40%, mid-apicalanteroseptal DK, Gr 1 DD, mild-mod dil LA.  Marland Kitchen Chronic combined systolic and diastolic CHF, NYHA class 1     a. 07/2014 Echo: EF 35-40%, Gr 1 DD.  Marland Kitchen Vertigo   . Depression   . Non-obstructive CAD     a. 07/2014 Abnl MV;  b. 08/2014 Cath: LM nl, LAD 30p, RI 40p, LCX nl, OM1 40, RCA dominant 30p, 70d-->Med Rx.  . Left-sided Bell's palsy   . Poor balance   . Fall 11-10-14    Past Surgical History  Procedure Laterality Date  . Ruptured disc      1962 and 1998  . Cervical fusion    . Knee surgery      left knee 1991 and 1992; right knee 1995  . Prostate surgery      cancer--1998  . Replacement total knee      2004  . Hand surgery      right 1993; left 2005  . Back surgery      2011  . Pacemaker insertion      PPM-- St Jude 11/30/10 by [name]  . Catheter ablation  1991    for WPW  . Joint replacement Left 2013    knee  . Joint replacement Right 2004    knee  . Lumbar laminectomy/decompression microdiscectomy N/A 06/07/2014    Procedure: LUMBAR FOUR TO FIVE LUMBAR LAMINECTOMY/DECOMPRESSION MICRODISCECTOMY 1 LEVEL;  Surgeon: [name], MD;  Location: [place] [facility];  Service: Neurosurgery;  Laterality: N/A;  . Insert / replace / remove  pacemaker    . Cardiac catheterization  08/26/2014    Single vessel obstructive CAD  . Left heart catheterization with coronary angiogram N/A 08/26/2014    Procedure: LEFT HEART CATHETERIZATION WITH CORONARY ANGIOGRAM;  Surgeon: [name], MD;  Location: [facility] CATH LAB;  Service: Cardiovascular;  Laterality: N/A;  . Cataract extraction  07-31-11 and 09-18-11  . Hernia repair  1955  . Cholecystectomy  09-07-14  . Trigger finger release  01-24-15  . Carpal tunnel release  04-04-15    [facility]  . Wound debridement Left 06/07/2015    Procedure: DEBRIDEMENT WOUND;  Surgeon: Robert Bellow, MD;  Location: [facility] ORS;  Service: General;  Laterality: Left;  left upper back  . Application of wound vac Left 06/07/2015    Procedure: APPLICATION OF WOUND VAC;  Surgeon: Robert Bellow, MD;  Location: [facility] ORS;  Service: General;  Laterality: Left;  left upper back    Family History  Problem Relation Age of Onset  . CAD    .  Heart attack Mother   . Hyperlipidemia Mother     Social History History  Substance Use Topics  . Smoking status: Former Smoker -- 4 years  . Smokeless tobacco: Never Used     Comment: Quit 2011  . Alcohol Use: No     Comment: occasional    Allergies  Allergen Reactions  . Sulfonamide Derivatives Rash    Current Outpatient Prescriptions  Medication Sig Dispense Refill  . amLODipine (NORVASC) 5 MG tablet Take 1 tablet (5 mg total) by mouth daily. 90 tablet 3  . amoxicillin-clavulanate (AUGMENTIN) 875-125 MG per tablet Take 1 tablet by mouth 2 (two) times daily.    Marland Kitchen aspirin 81 MG tablet Take 81 mg by mouth daily.      Marland Kitchen BAYER CONTOUR NEXT TEST test strip     . Cholecalciferol (VITAMIN D3) 1000 UNITS CAPS Take 1,000 Units by mouth daily.     . Colesevelam HCl (WELCHOL) 3.75 G PACK Take 3.75 g by mouth daily. Mix in [place] of water and drink.    . hydrochlorothiazide 25 MG tablet Take 1 tablet (25 mg total) by mouth daily. 90 tablet 3  . HYDROcodone-acetaminophen (NORCO/VICODIN)  5-325 MG per tablet Take 1 tablet by mouth every 6 (six) hours as needed for moderate pain.    Marland Kitchen lisinopril (PRINIVIL,ZESTRIL) 10 MG tablet Take 1 tablet (10 mg total) by mouth daily. 90 tablet 3  . metFORMIN (GLUCOPHAGE) 500 MG tablet Take 1 tablet by mouth 2 (two) times daily.     . metoprolol succinate (TOPROL-XL) 25 MG 24 hr tablet Take 25 mg by mouth every evening.    . Multiple Vitamin (MULTIVITAMIN) capsule Take 1 capsule by mouth daily.      Marland Kitchen omeprazole (PRILOSEC) 40 MG capsule Take 40 mg by mouth 2 (two) times daily.    . simvastatin (ZOCOR) 40 MG tablet Take 1 tablet (40 mg total) by mouth at bedtime. 90 tablet 3   No current facility-administered medications for this visit.    Review of Systems Review of Systems  Constitutional: Negative.   Respiratory: Negative.   Cardiovascular: Negative.     Blood pressure 120/60, pulse 72, resp. rate 12, height '5\' 9"'$  (1.753 m), weight 179 lb (81.194 kg).  Physical Exam Physical Exam  Pulmonary/Chest:  Left back abscess is significantly decreased in size now measuring 2.5 x 6.0 cm, 1 cm in depth. Clean granulating base. No necrotic tissue. Persistent area of erythema superior and medial to the wound, improved.    Data Reviewed Intraoperative cultures confirm Proteus, light growth. Sensitive to ampicillin.  Assessment    Continued progress with resolution of the inflammatory changes.    Plan    Patient to return in three day nurse check.  We will plan for discontinuation of the wound VAC at that time and initiation of daily dressing changes with showers, Neosporin application and dry gauze. Physician exam in one week.       Robert Bellow 06/13/2015, 1:31 PM

## 2015-06-12 NOTE — Patient Instructions (Signed)
Patient to return in three day nurse check

## 2015-06-13 LAB — WOUND CULTURE

## 2015-06-15 ENCOUNTER — Ambulatory Visit (INDEPENDENT_AMBULATORY_CARE_PROVIDER_SITE_OTHER): Payer: Medicare Other | Admitting: *Deleted

## 2015-06-15 DIAGNOSIS — L02212 Cutaneous abscess of back [any part, except buttock]: Secondary | ICD-10-CM

## 2015-06-15 NOTE — Progress Notes (Signed)
Patient came in today for a wound check.  Wound vac removed. The wound is clean, with no signs of infection noted. Dressing care discussed with his wife and demonstrated. Follow up as scheduled on 06-20-15

## 2015-06-15 NOTE — Patient Instructions (Addendum)
The patient is aware to call back for any questions or concerns. Dressing care as discussed may shower

## 2015-06-16 ENCOUNTER — Telehealth: Payer: Self-pay

## 2015-06-16 ENCOUNTER — Encounter: Payer: Self-pay | Admitting: Family Medicine

## 2015-06-16 ENCOUNTER — Ambulatory Visit
Admission: RE | Admit: 2015-06-16 | Discharge: 2015-06-16 | Disposition: A | Discharge status: Home / Self Care | Payer: Medicare Other | Source: Ambulatory Visit | Attending: Family Medicine | Admitting: Family Medicine

## 2015-06-16 ENCOUNTER — Ambulatory Visit (INDEPENDENT_AMBULATORY_CARE_PROVIDER_SITE_OTHER): Payer: Medicare Other | Admitting: Family Medicine

## 2015-06-16 VITALS — BP 110/58 | HR 72 | Temp 98.1°F | Resp 16 | Wt 183.0 lb

## 2015-06-16 DIAGNOSIS — L02212 Cutaneous abscess of back [any part, except buttock]: Secondary | ICD-10-CM | POA: Diagnosis not present

## 2015-06-16 DIAGNOSIS — J40 Bronchitis, not specified as acute or chronic: Secondary | ICD-10-CM | POA: Diagnosis not present

## 2015-06-16 DIAGNOSIS — K219 Gastro-esophageal reflux disease without esophagitis: Secondary | ICD-10-CM | POA: Insufficient documentation

## 2015-06-16 DIAGNOSIS — R05 Cough: Secondary | ICD-10-CM | POA: Diagnosis not present

## 2015-06-16 DIAGNOSIS — K2100 Gastro-esophageal reflux disease with esophagitis, without bleeding: Secondary | ICD-10-CM

## 2015-06-16 DIAGNOSIS — I10 Essential (primary) hypertension: Secondary | ICD-10-CM | POA: Insufficient documentation

## 2015-06-16 DIAGNOSIS — Z87891 Personal history of nicotine dependence: Secondary | ICD-10-CM | POA: Insufficient documentation

## 2015-06-16 MED ORDER — CEFPODOXIME PROXETIL 200 MG PO TABS: 200.0000 mg | ORAL_TABLET | Freq: Two times a day (BID) | ORAL | Status: DC

## 2015-06-16 NOTE — Progress Notes (Signed)
Subjective:    Patient ID: Howard [name], male    DOB: 1931/04/17, 81 y.o.   MRN: [id number]  HPI Cough and Nasal Congestion episode since Wednesday 06-14-15. Has been treated for a severe infection in the back (abscess left lower scapula area). Finished 14 day treatment with Amoxicillin yesterday. No recent fever and cough productive of yellow sputum. Denies dyspnea but has episode so reflux at night. Usually controlled by use of Omeprazole regularly. Has a follow up appointment with the surgeon that carried out his I&D procedure. Culture of abscess identified Proteus Mirabilis as the cause of the infection.  Past Medical History  Diagnosis Date  . Hyperlipidemia   . Hypertension   . Complete heart block     a. 11/2010 s/p SJM 2210 [facility] DC PPM, ser# [id number].  . WPW (Wolff-Parkinson-White syndrome)     a. S/P RFCA 1991.  . Sleep apnea     a. cpap  . Diabetes mellitus without complication   . LBBB (left bundle branch block)   . GERD (gastroesophageal reflux disease)   . Arthritis   . Cancer     prostate and skin  . NICM (nonischemic cardiomyopathy)     a. 07/2014 Echo: EF 35-40%, mid-apicalanteroseptal DK, Gr 1 DD, mild-mod dil LA.  Marland Kitchen Chronic combined systolic and diastolic CHF, NYHA class 1     a. 07/2014 Echo: EF 35-40%, Gr 1 DD.  Marland Kitchen Vertigo   . Depression   . Non-obstructive CAD     a. 07/2014 Abnl MV;  b. 08/2014 Cath: LM nl, LAD 30p, RI 40p, LCX nl, OM1 40, RCA dominant 30p, 70d-->Med Rx.  . Left-sided Bell's palsy   . Poor balance   . Fall 11-10-14   Past Surgical History  Procedure Laterality Date  . Ruptured disc      1962 and 1998  . Cervical fusion    . Knee surgery      left knee 1991 and 1992; right knee 1995  . Prostate surgery      cancer--1998  . Replacement total knee      2004  . Hand surgery      right 1993; left 2005  . Back surgery      2011  . Pacemaker insertion      PPM-- St Jude 11/30/10 by [name]  . Catheter ablation  1991    for WPW  . Joint  replacement Left 2013    knee  . Joint replacement Right 2004    knee  . Lumbar laminectomy/decompression microdiscectomy N/A 06/07/2014    Procedure: LUMBAR FOUR TO FIVE LUMBAR LAMINECTOMY/DECOMPRESSION MICRODISCECTOMY 1 LEVEL;  Surgeon: [name], MD;  Location: [place] [facility];  Service: Neurosurgery;  Laterality: N/A;  . Insert / replace / remove pacemaker    . Cardiac catheterization  08/26/2014    Single vessel obstructive CAD  . Left heart catheterization with coronary angiogram N/A 08/26/2014    Procedure: LEFT HEART CATHETERIZATION WITH CORONARY ANGIOGRAM;  Surgeon: [name], MD;  Location: [facility] CATH LAB;  Service: Cardiovascular;  Laterality: N/A;  . Cataract extraction  07-31-11 and 09-18-11  . Hernia repair  1955  . Cholecystectomy  09-07-14  . Trigger finger release  01-24-15  . Carpal tunnel release  04-04-15    [facility]  . Wound debridement Left 06/07/2015    Procedure: DEBRIDEMENT WOUND;  Surgeon: Robert Bellow, MD;  Location: [facility] ORS;  Service: General;  Laterality: Left;  left upper back  .  Application of wound vac Left 06/07/2015    Procedure: APPLICATION OF WOUND VAC;  Surgeon: Robert Bellow, MD;  Location: [facility] ORS;  Service: General;  Laterality: Left;  left upper back   History  Substance Use Topics  . Smoking status: Former Smoker -- 4 years  . Smokeless tobacco: Never Used     Comment: Quit 2011  . Alcohol Use: No     Comment: occasional   Family History  Problem Relation Age of Onset  . CAD    . Heart attack Mother   . Hyperlipidemia Mother    Current Outpatient Prescriptions on File Prior to Visit  Medication Sig Dispense Refill  . amLODipine (NORVASC) 5 MG tablet Take 1 tablet (5 mg total) by mouth daily. 90 tablet 3  . aspirin 81 MG tablet Take 81 mg by mouth daily.      Marland Kitchen BAYER CONTOUR NEXT TEST test strip     . Cholecalciferol (VITAMIN D3) 1000 UNITS CAPS Take 1,000 Units by mouth daily.     . Colesevelam HCl (WELCHOL) 3.75 G PACK Take 3.75 g by  mouth daily. Mix in [place] of water and drink.    . hydrochlorothiazide 25 MG tablet Take 1 tablet (25 mg total) by mouth daily. 90 tablet 3  . HYDROcodone-acetaminophen (NORCO/VICODIN) 5-325 MG per tablet Take 1 tablet by mouth every 6 (six) hours as needed for moderate pain.    Marland Kitchen lisinopril (PRINIVIL,ZESTRIL) 10 MG tablet Take 1 tablet (10 mg total) by mouth daily. 90 tablet 3  . metFORMIN (GLUCOPHAGE) 500 MG tablet Take 1 tablet by mouth 2 (two) times daily.     . metoprolol succinate (TOPROL-XL) 25 MG 24 hr tablet Take 25 mg by mouth every evening.    . Multiple Vitamin (MULTIVITAMIN) capsule Take 1 capsule by mouth daily.      Marland Kitchen omeprazole (PRILOSEC) 40 MG capsule Take 40 mg by mouth 2 (two) times daily.    . simvastatin (ZOCOR) 40 MG tablet Take 1 tablet (40 mg total) by mouth at bedtime. 90 tablet 3   No current facility-administered medications on file prior to visit.   Allergies  Allergen Reactions  . Sulfonamide Derivatives Rash     Review of Systems  Constitutional: Negative.   HENT: Positive for congestion.   Eyes: Negative.   Respiratory: Positive for cough.   Cardiovascular: Negative.   Gastrointestinal: Negative.       BP 110/58 mmHg  Pulse 72  Temp(Src) 98.1 F (36.7 C) (Oral)  Resp 16  Wt 183 lb (83.008 kg)  SpO2 90%  Objective:   Physical Exam  Constitutional: He appears well-developed and well-nourished.  HENT:  Head: Normocephalic.  Right Ear: External ear normal.  Left Ear: External ear normal.  Nose: Nose normal.  Neck: Normal range of motion. Neck supple.  Pulmonary/Chest: Effort normal.  Pacemaker in the left upper chest, subcutaneously. Coarse breath sounds.  Abdominal: Soft. Bowel sounds are normal.  Lymphadenopathy:    He has no cervical adenopathy.    He has no axillary adenopathy.  Skin:         Assessment & Plan:  1. Bronchitis Recent onset after finishing 14 regimen of Amoxicillin. Will get CBC and CXR to rule out significant  persistent infections. - CBC with Differential/Platelet - DG Chest 2 View  2. Gastroesophageal reflux disease with esophagitis Usually fairly controlled with the use of the Omeprazole BID. No recent hematemesis or melena. Continue present regimen.  3. Abscess of back Healing  well. Recheck with Dr. Bary Castilla as planned. - CBC with Differential/Platelet

## 2015-06-16 NOTE — Telephone Encounter (Signed)
-----   Message from [place], [place] sent at 06/16/2015  2:03 PM EDT ----- Chest x-ray shows some opacity in the left basilar area that could represent infection. Recommend Vantin 200 mg BID and recheck lungs in 1 week. Awaiting blood count report.

## 2015-06-16 NOTE — Telephone Encounter (Signed)
Patient's wife [name] advised as directed below. Patient's wife verbalized understanding. Patient scheduled for a 1 week follow up on 06/23/15.

## 2015-06-17 LAB — CBC WITH DIFFERENTIAL/PLATELET
BASOS: 1 %
Basophils Absolute: 0.1 10*3/uL (ref 0.0–0.2)
EOS (ABSOLUTE): 0.5 10*3/uL — ABNORMAL HIGH (ref 0.0–0.4)
Eos: 5 %
HEMATOCRIT: 39.1 % (ref 37.5–51.0)
HEMOGLOBIN: 12.8 g/dL (ref 12.6–17.7)
IMMATURE GRANS (ABS): 0 10*3/uL (ref 0.0–0.1)
IMMATURE GRANULOCYTES: 0 %
Lymphocytes Absolute: 2.8 10*3/uL (ref 0.7–3.1)
Lymphs: 30 %
MCH: 26.1 pg — AB (ref 26.6–33.0)
MCHC: 32.7 g/dL (ref 31.5–35.7)
MCV: 80 fL (ref 79–97)
Monocytes Absolute: 0.9 10*3/uL (ref 0.1–0.9)
Monocytes: 10 %
NEUTROS ABS: 5 10*3/uL (ref 1.4–7.0)
Neutrophils: 54 %
Platelets: 327 10*3/uL (ref 150–379)
RBC: 4.9 x10E6/uL (ref 4.14–5.80)
RDW: 15.8 % — AB (ref 12.3–15.4)
WBC: 9.2 10*3/uL (ref 3.4–10.8)

## 2015-06-19 ENCOUNTER — Telehealth: Payer: Self-pay

## 2015-06-19 NOTE — Telephone Encounter (Signed)
-----   Message from [employer], [place] sent at 06/19/2015  8:20 AM EDT ----- WBC count normal without signs of anemia. Finish all the antibiotic and recheck as planned.

## 2015-06-19 NOTE — Telephone Encounter (Signed)
Patient's wife Darryle Dennie advised as directed below. Wife verbalized understanding.

## 2015-06-20 ENCOUNTER — Encounter: Payer: Self-pay | Admitting: General Surgery

## 2015-06-20 ENCOUNTER — Ambulatory Visit (INDEPENDENT_AMBULATORY_CARE_PROVIDER_SITE_OTHER): Payer: Medicare Other | Admitting: General Surgery

## 2015-06-20 VITALS — BP 130/70 | HR 72 | Resp 14 | Ht 69.0 in | Wt 182.0 lb

## 2015-06-20 DIAGNOSIS — L02212 Cutaneous abscess of back [any part, except buttock]: Secondary | ICD-10-CM

## 2015-06-20 NOTE — Patient Instructions (Signed)
Patient to return in one week. 

## 2015-06-20 NOTE — Progress Notes (Signed)
Patient ID: Howard [name], male   DOB: 1930/12/30, 81 y.o.   MRN: [id number]  Chief Complaint  Patient presents with  . Wound Check    left back abscess    HPI Howard [name] is a 81 y.o. male here today following up from a back abscess. Patient states she was seen by Dr. Janae Bridgeman on Friday for a chest infection. He had chest x-rays and he is currently taking Augmentin. HPI  Past Medical History  Diagnosis Date  . Hyperlipidemia   . Hypertension   . Complete heart block     a. 11/2010 s/p SJM 2210 [facility] DC PPM, ser# [id number].  . WPW (Wolff-Parkinson-White syndrome)     a. S/P RFCA 1991.  . Sleep apnea     a. cpap  . Diabetes mellitus without complication   . LBBB (left bundle branch block)   . GERD (gastroesophageal reflux disease)   . Arthritis   . Cancer     prostate and skin  . NICM (nonischemic cardiomyopathy)     a. 07/2014 Echo: EF 35-40%, mid-apicalanteroseptal DK, Gr 1 DD, mild-mod dil LA.  Marland Kitchen Chronic combined systolic and diastolic CHF, NYHA class 1     a. 07/2014 Echo: EF 35-40%, Gr 1 DD.  Marland Kitchen Vertigo   . Depression   . Non-obstructive CAD     a. 07/2014 Abnl MV;  b. 08/2014 Cath: LM nl, LAD 30p, RI 40p, LCX nl, OM1 40, RCA dominant 30p, 70d-->Med Rx.  . Left-sided Bell's palsy   . Poor balance   . Fall 11-10-14    Past Surgical History  Procedure Laterality Date  . Ruptured disc      1962 and 1998  . Cervical fusion    . Knee surgery      left knee 1991 and 1992; right knee 1995  . Prostate surgery      cancer--1998  . Replacement total knee      2004  . Hand surgery      right 1993; left 2005  . Back surgery      2011  . Pacemaker insertion      PPM-- St Jude 11/30/10 by [name]  . Catheter ablation  1991    for WPW  . Joint replacement Left 2013    knee  . Joint replacement Right 2004    knee  . Lumbar laminectomy/decompression microdiscectomy N/A 06/07/2014    Procedure: LUMBAR FOUR TO FIVE LUMBAR LAMINECTOMY/DECOMPRESSION MICRODISCECTOMY 1 LEVEL;   Surgeon: [name], MD;  Location: [place] [facility];  Service: Neurosurgery;  Laterality: N/A;  . Insert / replace / remove pacemaker    . Cardiac catheterization  08/26/2014    Single vessel obstructive CAD  . Left heart catheterization with coronary angiogram N/A 08/26/2014    Procedure: LEFT HEART CATHETERIZATION WITH CORONARY ANGIOGRAM;  Surgeon: [name], MD;  Location: [facility] CATH LAB;  Service: Cardiovascular;  Laterality: N/A;  . Cataract extraction  07-31-11 and 09-18-11  . Hernia repair  1955  . Cholecystectomy  09-07-14  . Trigger finger release  01-24-15  . Carpal tunnel release  04-04-15    [facility]  . Wound debridement Left 06/07/2015    Procedure: DEBRIDEMENT WOUND;  Surgeon: Robert Bellow, MD;  Location: [facility] ORS;  Service: General;  Laterality: Left;  left upper back  . Application of wound vac Left 06/07/2015    Procedure: APPLICATION OF WOUND VAC;  Surgeon: Robert Bellow, MD;  Location: [facility] ORS;  Service:  General;  Laterality: Left;  left upper back    Family History  Problem Relation Age of Onset  . CAD    . Heart attack Mother   . Hyperlipidemia Mother     Social History History  Substance Use Topics  . Smoking status: Former Smoker -- 4 years  . Smokeless tobacco: Never Used     Comment: Quit 2011  . Alcohol Use: No     Comment: occasional    Allergies  Allergen Reactions  . Sulfonamide Derivatives Rash    Current Outpatient Prescriptions  Medication Sig Dispense Refill  . amLODipine (NORVASC) 5 MG tablet Take 1 tablet (5 mg total) by mouth daily. 90 tablet 3  . amoxicillin-clavulanate (AUGMENTIN) 875-125 MG per tablet     . aspirin 81 MG tablet Take 81 mg by mouth daily.      Marland Kitchen BAYER CONTOUR NEXT TEST test strip     . cefpodoxime (VANTIN) 200 MG tablet Take 1 tablet (200 mg total) by mouth 2 (two) times daily. 20 tablet 0  . Cholecalciferol (VITAMIN D3) 1000 UNITS CAPS Take 1,000 Units by mouth daily.     . Colesevelam HCl (WELCHOL) 3.75 G PACK Take  3.75 g by mouth daily. Mix in [place] of water and drink.    . hydrochlorothiazide 25 MG tablet Take 1 tablet (25 mg total) by mouth daily. 90 tablet 3  . HYDROcodone-acetaminophen (NORCO/VICODIN) 5-325 MG per tablet Take 1 tablet by mouth every 6 (six) hours as needed for moderate pain.    Marland Kitchen lisinopril (PRINIVIL,ZESTRIL) 10 MG tablet Take 1 tablet (10 mg total) by mouth daily. 90 tablet 3  . metFORMIN (GLUCOPHAGE) 500 MG tablet Take 1 tablet by mouth 2 (two) times daily.     . metoprolol succinate (TOPROL-XL) 25 MG 24 hr tablet Take 25 mg by mouth every evening.    . Multiple Vitamin (MULTIVITAMIN) capsule Take 1 capsule by mouth daily.      Marland Kitchen omeprazole (PRILOSEC) 40 MG capsule Take 40 mg by mouth 2 (two) times daily.    . simvastatin (ZOCOR) 40 MG tablet Take 1 tablet (40 mg total) by mouth at bedtime. 90 tablet 3   No current facility-administered medications for this visit.    Review of Systems Review of Systems  Constitutional: Negative.   Respiratory: Negative.   Cardiovascular: Negative.     Blood pressure 130/70, pulse 72, resp. rate 14, height '5\' 9"'$  (1.753 m), weight 182 lb (82.555 kg).  Physical Exam Physical Exam  Constitutional: He is oriented to person, place, and time. He appears well-developed and well-nourished.  Eyes: Conjunctivae are normal. No scleral icterus.  Neck: Neck supple.  Cardiovascular: Normal rate, regular rhythm and normal heart sounds.   Musculoskeletal:       Arms: Lymphadenopathy:    He has no cervical adenopathy.  Neurological: He is alert and oriented to person, place, and time.  Skin: Skin is warm and dry.  Psychiatric: He has a normal mood and affect.    Data Reviewed Repeat culture results again showed Proteus.  Assessment    The patient is doing well status post incision and drainage/debridement of a sizable left back abscess.    Plan    Patient will continue daily application of Neosporin.    Patient to return in one week. PCP:   Charna Busman 06/21/2015, 9:13 PM

## 2015-06-23 ENCOUNTER — Ambulatory Visit (INDEPENDENT_AMBULATORY_CARE_PROVIDER_SITE_OTHER): Payer: Medicare Other | Admitting: Family Medicine

## 2015-06-23 ENCOUNTER — Encounter: Payer: Self-pay | Admitting: Family Medicine

## 2015-06-23 VITALS — BP 124/60 | HR 68 | Temp 98.0°F | Resp 14 | Wt 184.6 lb

## 2015-06-23 DIAGNOSIS — J40 Bronchitis, not specified as acute or chronic: Secondary | ICD-10-CM

## 2015-06-23 NOTE — Progress Notes (Signed)
Subjective:    Patient ID: Howard [name], male    DOB: 06-Jan-1931, 81 y.o.   MRN: [id number]  HPI Follow-up bronchitis treated 06-16-15 with Vantin. No further purulent sputum, cough and no fever. CXR showed some questionable left basilar infiltrate versus atelectasis. Also, showed a 9 mm nodule in the right base that was unchanged compared to the CT scan 07-29-14.  Lab Results  Component Value Date   CREATININE 0.71* 04/26/2015   BUN 12 04/26/2015   NA 134 04/26/2015   K 4.4 04/26/2015   CL 94* 04/26/2015   CO2 24 04/26/2015   Lab Results  Component Value Date   WBC 9.2 06/16/2015   HGB 12.5* 06/07/2015   HCT 39.1 06/16/2015   MCV 80.3 06/07/2015   PLT 301 06/07/2015  .x  Past Medical History  Diagnosis Date  . Hyperlipidemia   . Hypertension   . Complete heart block     a. 11/2010 s/p SJM 2210 [facility] DC PPM, ser# [id number].  . WPW (Wolff-Parkinson-White syndrome)     a. S/P RFCA 1991.  . Sleep apnea     a. cpap  . Diabetes mellitus without complication   . LBBB (left bundle branch block)   . GERD (gastroesophageal reflux disease)   . Arthritis   . Cancer     prostate and skin  . NICM (nonischemic cardiomyopathy)     a. 07/2014 Echo: EF 35-40%, mid-apicalanteroseptal DK, Gr 1 DD, mild-mod dil LA.  Marland Kitchen Chronic combined systolic and diastolic CHF, NYHA class 1     a. 07/2014 Echo: EF 35-40%, Gr 1 DD.  Marland Kitchen Vertigo   . Depression   . Non-obstructive CAD     a. 07/2014 Abnl MV;  b. 08/2014 Cath: LM nl, LAD 30p, RI 40p, LCX nl, OM1 40, RCA dominant 30p, 70d-->Med Rx.  . Left-sided Bell's palsy   . Poor balance   . Fall 11-10-14   Past Surgical History  Procedure Laterality Date  . Ruptured disc      1962 and 1998  . Cervical fusion    . Knee surgery      left knee 1991 and 1992; right knee 1995  . Prostate surgery      cancer--1998  . Replacement total knee      2004  . Hand surgery      right 1993; left 2005  . Back surgery      2011  . Pacemaker insertion       PPM-- St Jude 11/30/10 by [name]  . Catheter ablation  1991    for WPW  . Joint replacement Left 2013    knee  . Joint replacement Right 2004    knee  . Lumbar laminectomy/decompression microdiscectomy N/A 06/07/2014    Procedure: LUMBAR FOUR TO FIVE LUMBAR LAMINECTOMY/DECOMPRESSION MICRODISCECTOMY 1 LEVEL;  Surgeon: [name], MD;  Location: [place] [facility];  Service: Neurosurgery;  Laterality: N/A;  . Insert / replace / remove pacemaker    . Cardiac catheterization  08/26/2014    Single vessel obstructive CAD  . Left heart catheterization with coronary angiogram N/A 08/26/2014    Procedure: LEFT HEART CATHETERIZATION WITH CORONARY ANGIOGRAM;  Surgeon: [name], MD;  Location: [facility] CATH LAB;  Service: Cardiovascular;  Laterality: N/A;  . Cataract extraction  07-31-11 and 09-18-11  . Hernia repair  1955  . Cholecystectomy  09-07-14  . Trigger finger release  01-24-15  . Carpal tunnel release  04-04-15    [facility]  .  Wound debridement Left 06/07/2015    Procedure: DEBRIDEMENT WOUND;  Surgeon: Robert Bellow, MD;  Location: [facility] ORS;  Service: General;  Laterality: Left;  left upper back  . Application of wound vac Left 06/07/2015    Procedure: APPLICATION OF WOUND VAC;  Surgeon: Robert Bellow, MD;  Location: [facility] ORS;  Service: General;  Laterality: Left;  left upper back   History  Substance Use Topics  . Smoking status: Former Smoker -- 4 years  . Smokeless tobacco: Never Used     Comment: Quit 2011  . Alcohol Use: No     Comment: occasional   Family History  Problem Relation Age of Onset  . CAD    . Heart attack Mother   . Hyperlipidemia Mother    Current Outpatient Prescriptions on File Prior to Visit  Medication Sig Dispense Refill  . amLODipine (NORVASC) 5 MG tablet Take 1 tablet (5 mg total) by mouth daily. 90 tablet 3  . amoxicillin-clavulanate (AUGMENTIN) 875-125 MG per tablet     . aspirin 81 MG tablet Take 81 mg by mouth daily.      Marland Kitchen BAYER CONTOUR NEXT TEST test strip      . cefpodoxime (VANTIN) 200 MG tablet Take 1 tablet (200 mg total) by mouth 2 (two) times daily. 20 tablet 0  . Cholecalciferol (VITAMIN D3) 1000 UNITS CAPS Take 1,000 Units by mouth daily.     . Colesevelam HCl (WELCHOL) 3.75 G PACK Take 3.75 g by mouth daily. Mix in [place] of water and drink.    . hydrochlorothiazide 25 MG tablet Take 1 tablet (25 mg total) by mouth daily. 90 tablet 3  . HYDROcodone-acetaminophen (NORCO/VICODIN) 5-325 MG per tablet Take 1 tablet by mouth every 6 (six) hours as needed for moderate pain.    Marland Kitchen lisinopril (PRINIVIL,ZESTRIL) 10 MG tablet Take 1 tablet (10 mg total) by mouth daily. 90 tablet 3  . metFORMIN (GLUCOPHAGE) 500 MG tablet Take 1 tablet by mouth 2 (two) times daily.     . metoprolol succinate (TOPROL-XL) 25 MG 24 hr tablet Take 25 mg by mouth every evening.    . Multiple Vitamin (MULTIVITAMIN) capsule Take 1 capsule by mouth daily.      Marland Kitchen omeprazole (PRILOSEC) 40 MG capsule Take 40 mg by mouth 2 (two) times daily.    . simvastatin (ZOCOR) 40 MG tablet Take 1 tablet (40 mg total) by mouth at bedtime. 90 tablet 3   No current facility-administered medications on file prior to visit.   Allergies  Allergen Reactions  . Sulfonamide Derivatives Rash   Review of Systems  Constitutional: Negative.   HENT: Negative.   Respiratory: Negative.        Minimal clear sputum now.  Cardiovascular: Negative.   Gastrointestinal: Negative.       BP 124/60 mmHg  Pulse 68  Temp(Src) 98 F (36.7 C) (Oral)  Resp 14  Wt 184 lb 9.6 oz (83.734 kg)  SpO2 95%  Objective:   Physical Exam  Constitutional: He is oriented to person, place, and time. He appears well-developed and well-nourished. No distress.  HENT:  Head: Normocephalic and atraumatic.  Right Ear: Hearing normal.  Left Ear: Hearing normal.  Nose: Nose normal.  Eyes: Conjunctivae and lids are normal. Right eye exhibits no discharge. Left eye exhibits no discharge. No scleral icterus.  Cardiovascular:  Normal rate, regular rhythm and normal heart sounds.   Pacemaker in left upper chest wall.  Pulmonary/Chest: Effort normal and breath sounds normal.  No respiratory distress.  Musculoskeletal: Normal range of motion.  Neurological: He is alert and oriented to person, place, and time.  Skin: Skin is intact. No lesion and no rash noted.  Psychiatric: He has a normal mood and affect. His speech is normal and behavior is normal. Thought content normal.      Assessment & Plan:  1. Bronchitis Resolving since finishing the Vantin. No purulent sputum or fever now. Recommend using Mucinex or Robitussin-PE prn. Increase fluid intake. With questionable infiltrate in the left base (near the area of a larger abscess that was excised), will recheck CXR in 3-4 weeks.

## 2015-06-27 ENCOUNTER — Ambulatory Visit (INDEPENDENT_AMBULATORY_CARE_PROVIDER_SITE_OTHER): Payer: Medicare Other | Admitting: General Surgery

## 2015-06-27 ENCOUNTER — Encounter: Payer: Self-pay | Admitting: General Surgery

## 2015-06-27 VITALS — BP 130/68 | HR 63 | Resp 14 | Ht 69.0 in | Wt 183.0 lb

## 2015-06-27 DIAGNOSIS — L02212 Cutaneous abscess of back [any part, except buttock]: Secondary | ICD-10-CM

## 2015-06-27 NOTE — Patient Instructions (Signed)
The patient is aware to call back for any questions or concerns.  

## 2015-06-27 NOTE — Progress Notes (Signed)
Patient ID: Howard [name], male   DOB: 02/02/1931, 81 y.o.   MRN: [id number]  Chief Complaint  Patient presents with  . Follow-up    back abscess    HPI Howard [name] is a 81 y.o. male.  here today following up from a back abscess. He states he is getting along well. He has completed the antibiotics. He states the cough is better. The wound is draining "some" per wife and the dressing is changed daily.   HPI  Past Medical History  Diagnosis Date  . Hyperlipidemia   . Hypertension   . Complete heart block     a. 11/2010 s/p SJM 2210 [facility] DC PPM, ser# [id number].  . WPW (Wolff-Parkinson-White syndrome)     a. S/P RFCA 1991.  . Sleep apnea     a. cpap  . Diabetes mellitus without complication   . LBBB (left bundle branch block)   . GERD (gastroesophageal reflux disease)   . Arthritis   . Cancer     prostate and skin  . NICM (nonischemic cardiomyopathy)     a. 07/2014 Echo: EF 35-40%, mid-apicalanteroseptal DK, Gr 1 DD, mild-mod dil LA.  Marland Kitchen Chronic combined systolic and diastolic CHF, NYHA class 1     a. 07/2014 Echo: EF 35-40%, Gr 1 DD.  Marland Kitchen Vertigo   . Depression   . Non-obstructive CAD     a. 07/2014 Abnl MV;  b. 08/2014 Cath: LM nl, LAD 30p, RI 40p, LCX nl, OM1 40, RCA dominant 30p, 70d-->Med Rx.  . Left-sided Bell's palsy   . Poor balance   . Fall 11-10-14    Past Surgical History  Procedure Laterality Date  . Ruptured disc      1962 and 1998  . Cervical fusion    . Knee surgery      left knee 1991 and 1992; right knee 1995  . Prostate surgery      cancer--1998  . Replacement total knee      2004  . Hand surgery      right 1993; left 2005  . Back surgery      2011  . Pacemaker insertion      PPM-- St Jude 11/30/10 by [name]  . Catheter ablation  1991    for WPW  . Joint replacement Left 2013    knee  . Joint replacement Right 2004    knee  . Lumbar laminectomy/decompression microdiscectomy N/A 06/07/2014    Procedure: LUMBAR FOUR TO FIVE LUMBAR  LAMINECTOMY/DECOMPRESSION MICRODISCECTOMY 1 LEVEL;  Surgeon: [name], MD;  Location: [place] [facility];  Service: Neurosurgery;  Laterality: N/A;  . Insert / replace / remove pacemaker    . Cardiac catheterization  08/26/2014    Single vessel obstructive CAD  . Left heart catheterization with coronary angiogram N/A 08/26/2014    Procedure: LEFT HEART CATHETERIZATION WITH CORONARY ANGIOGRAM;  Surgeon: [name], MD;  Location: [facility] CATH LAB;  Service: Cardiovascular;  Laterality: N/A;  . Cataract extraction  07-31-11 and 09-18-11  . Hernia repair  1955  . Cholecystectomy  09-07-14  . Trigger finger release  01-24-15  . Carpal tunnel release  04-04-15    [facility]  . Wound debridement Left 06/07/2015    Procedure: DEBRIDEMENT WOUND;  Surgeon: Robert Bellow, MD;  Location: [facility] ORS;  Service: General;  Laterality: Left;  left upper back  . Application of wound vac Left 06/07/2015    Procedure: APPLICATION OF WOUND VAC;  Surgeon: Forest Gleason  Bary Castilla, MD;  Location: [facility] ORS;  Service: General;  Laterality: Left;  left upper back    Family History  Problem Relation Age of Onset  . CAD    . Heart attack Mother   . Hyperlipidemia Mother     Social History History  Substance Use Topics  . Smoking status: Former Smoker -- 4 years  . Smokeless tobacco: Never Used     Comment: Quit 2011  . Alcohol Use: No     Comment: occasional    Allergies  Allergen Reactions  . Sulfonamide Derivatives Rash    Current Outpatient Prescriptions  Medication Sig Dispense Refill  . amLODipine (NORVASC) 5 MG tablet Take 1 tablet (5 mg total) by mouth daily. 90 tablet 3  . aspirin 81 MG tablet Take 81 mg by mouth daily.      Marland Kitchen BAYER CONTOUR NEXT TEST test strip     . cefpodoxime (VANTIN) 200 MG tablet Take 1 tablet (200 mg total) by mouth 2 (two) times daily. 20 tablet 0  . Cholecalciferol (VITAMIN D3) 1000 UNITS CAPS Take 1,000 Units by mouth daily.     . Colesevelam HCl (WELCHOL) 3.75 G PACK Take 3.75 g by mouth  daily. Mix in [place] of water and drink.    . hydrochlorothiazide 25 MG tablet Take 1 tablet (25 mg total) by mouth daily. 90 tablet 3  . HYDROcodone-acetaminophen (NORCO/VICODIN) 5-325 MG per tablet Take 1 tablet by mouth every 6 (six) hours as needed for moderate pain.    Marland Kitchen lisinopril (PRINIVIL,ZESTRIL) 10 MG tablet Take 1 tablet (10 mg total) by mouth daily. 90 tablet 3  . metFORMIN (GLUCOPHAGE) 500 MG tablet Take 1 tablet by mouth 2 (two) times daily.     . metoprolol succinate (TOPROL-XL) 25 MG 24 hr tablet Take 25 mg by mouth every evening.    . Multiple Vitamin (MULTIVITAMIN) capsule Take 1 capsule by mouth daily.      Marland Kitchen omeprazole (PRILOSEC) 40 MG capsule Take 40 mg by mouth 2 (two) times daily.    . simvastatin (ZOCOR) 40 MG tablet Take 1 tablet (40 mg total) by mouth at bedtime. 90 tablet 3   No current facility-administered medications for this visit.    Review of Systems Review of Systems  Constitutional: Negative.   Respiratory: Negative.   Cardiovascular: Negative.     Blood pressure 130/68, pulse 63, resp. rate 14, height '5\' 9"'$  (1.753 m), weight 183 lb (83.008 kg), SpO2 95 %.  Physical Exam Physical Exam  Constitutional: He is oriented to person, place, and time. He appears well-developed and well-nourished.  Pulmonary/Chest:    Neurological: He is alert and oriented to person, place, and time.  Skin: Skin is warm and dry.  Wound is 1.7 x 5   Psychiatric: He has a normal mood and affect.      Assessment    Doing well post debridement of a left back abscess.    Plan    The patient's asymptomatic, and his wife is not having any difficulty apply Neosporin to the wound after showers. Drainage is minimal. Rather than mobilize the tissue to elicit closure, we'll allow continued healing by secondary intent.      Follow up 2 weeks.   PCP:  Barbie Haggis 06/27/2015, 9:23 PM

## 2015-07-03 ENCOUNTER — Ambulatory Visit (INDEPENDENT_AMBULATORY_CARE_PROVIDER_SITE_OTHER): Payer: Medicare Other | Admitting: *Deleted

## 2015-07-03 DIAGNOSIS — I442 Atrioventricular block, complete: Secondary | ICD-10-CM | POA: Diagnosis not present

## 2015-07-03 NOTE — Progress Notes (Signed)
Remote pacemaker transmission.   

## 2015-07-04 LAB — CUP PACEART REMOTE DEVICE CHECK
Battery Remaining Longevity: 59 mo
Battery Remaining Percentage: 65 %
Battery Voltage: 2.9 V
Brady Statistic AP VS Percent: 1 %
Brady Statistic AS VP Percent: 92 %
Brady Statistic RV Percent Paced: 99 %
Date Time Interrogation Session: 20160711061400
Lead Channel Impedance Value: 360 Ohm
Lead Channel Impedance Value: 400 Ohm
Lead Channel Pacing Threshold Amplitude: 1 V
Lead Channel Pacing Threshold Pulse Width: 0.8 ms
Lead Channel Sensing Intrinsic Amplitude: 3.1 mV
Lead Channel Setting Pacing Amplitude: 2 V
Lead Channel Setting Pacing Amplitude: 2.5 V
MDC IDC MSMT LEADCHNL RA PACING THRESHOLD PULSEWIDTH: 0.4 ms
MDC IDC MSMT LEADCHNL RV PACING THRESHOLD AMPLITUDE: 0.75 V
MDC IDC MSMT LEADCHNL RV SENSING INTR AMPL: 12 mV
MDC IDC SET LEADCHNL RV PACING PULSEWIDTH: 0.8 ms
MDC IDC SET LEADCHNL RV SENSING SENSITIVITY: 4 mV
MDC IDC STAT BRADY AP VP PERCENT: 7 %
MDC IDC STAT BRADY AS VS PERCENT: 1 %
MDC IDC STAT BRADY RA PERCENT PACED: 6.8 %
Pulse Gen Serial Number: 7196739

## 2015-07-07 ENCOUNTER — Other Ambulatory Visit: Payer: Self-pay | Admitting: Cardiovascular Disease

## 2015-07-12 ENCOUNTER — Other Ambulatory Visit: Payer: Self-pay | Admitting: Family Medicine

## 2015-07-12 ENCOUNTER — Ambulatory Visit
Admission: RE | Admit: 2015-07-12 | Discharge: 2015-07-12 | Disposition: A | Discharge status: Home / Self Care | Payer: Medicare Other | Source: Ambulatory Visit | Attending: Family Medicine | Admitting: Family Medicine

## 2015-07-12 DIAGNOSIS — E119 Type 2 diabetes mellitus without complications: Secondary | ICD-10-CM | POA: Diagnosis not present

## 2015-07-12 DIAGNOSIS — J40 Bronchitis, not specified as acute or chronic: Secondary | ICD-10-CM | POA: Diagnosis not present

## 2015-07-12 DIAGNOSIS — R918 Other nonspecific abnormal finding of lung field: Secondary | ICD-10-CM | POA: Insufficient documentation

## 2015-07-12 DIAGNOSIS — I5042 Chronic combined systolic (congestive) and diastolic (congestive) heart failure: Secondary | ICD-10-CM | POA: Diagnosis not present

## 2015-07-12 DIAGNOSIS — I10 Essential (primary) hypertension: Secondary | ICD-10-CM | POA: Diagnosis not present

## 2015-07-12 DIAGNOSIS — Z87891 Personal history of nicotine dependence: Secondary | ICD-10-CM | POA: Diagnosis not present

## 2015-07-12 NOTE — Progress Notes (Signed)
[facility] called regarding a chest  x-ray for pt for today to have before coming for the Friday appointment to see his provider. Pt stated that he was notified by [facility] to get the chest x-ray before coming for his appointment. The order for the pt was not showing in their system. The order was placed by nursing staff per protocol.

## 2015-07-13 ENCOUNTER — Encounter: Payer: Self-pay | Admitting: General Surgery

## 2015-07-13 ENCOUNTER — Ambulatory Visit (INDEPENDENT_AMBULATORY_CARE_PROVIDER_SITE_OTHER): Payer: Medicare Other | Admitting: General Surgery

## 2015-07-13 VITALS — BP 130/60 | HR 78 | Resp 20 | Ht 69.0 in | Wt 185.0 lb

## 2015-07-13 DIAGNOSIS — L02212 Cutaneous abscess of back [any part, except buttock]: Secondary | ICD-10-CM

## 2015-07-13 NOTE — Patient Instructions (Signed)
Patient to return in three weeks.  

## 2015-07-13 NOTE — Progress Notes (Signed)
Patient ID: Howard [name], male   DOB: [birth year], 81 y.o.   MRN: [id number]  Chief Complaint  Patient presents with  . Routine Post Op    back abscess    HPI Howard [name] is a 81 y.o. male here today foolwoing up from his back abscess. Patient states he is doing well at this time.Only a little of drainage  HPI  Past Medical History  Diagnosis Date  . Hyperlipidemia   . Hypertension   . Complete heart block     a. 11/2010 s/p SJM 2210 [facility] DC PPM, ser# [id number].  . WPW (Wolff-Parkinson-White syndrome)     a. S/P RFCA 1991.  . Sleep apnea     a. cpap  . Diabetes mellitus without complication   . LBBB (left bundle branch block)   . GERD (gastroesophageal reflux disease)   . Arthritis   . Cancer     prostate and skin  . NICM (nonischemic cardiomyopathy)     a. 07/2014 Echo: EF 35-40%, mid-apicalanteroseptal DK, Gr 1 DD, mild-mod dil LA.  Marland Kitchen Chronic combined systolic and diastolic CHF, NYHA class 1     a. 07/2014 Echo: EF 35-40%, Gr 1 DD.  Marland Kitchen Vertigo   . Depression   . Non-obstructive CAD     a. 07/2014 Abnl MV;  b. 08/2014 Cath: LM nl, LAD 30p, RI 40p, LCX nl, OM1 40, RCA dominant 30p, 70d-->Med Rx.  . Left-sided Bell's palsy   . Poor balance   . Fall 11-10-14    Past Surgical History  Procedure Laterality Date  . Ruptured disc      1962 and 1998  . Cervical fusion    . Knee surgery      left knee 1991 and 1992; right knee 1995  . Prostate surgery      cancer--1998  . Replacement total knee      2004  . Hand surgery      right 1993; left 2005  . Back surgery      2011  . Pacemaker insertion      PPM-- St Jude 11/30/10 by [name]  . Catheter ablation  1991    for WPW  . Joint replacement Left 2013    knee  . Joint replacement Right 2004    knee  . Lumbar laminectomy/decompression microdiscectomy N/A 06/07/2014    Procedure: LUMBAR FOUR TO FIVE LUMBAR LAMINECTOMY/DECOMPRESSION MICRODISCECTOMY 1 LEVEL;  Surgeon: [name], MD;  Location: [place] [facility];  Service:  Neurosurgery;  Laterality: N/A;  . Insert / replace / remove pacemaker    . Cardiac catheterization  08/26/2014    Single vessel obstructive CAD  . Left heart catheterization with coronary angiogram N/A 08/26/2014    Procedure: LEFT HEART CATHETERIZATION WITH CORONARY ANGIOGRAM;  Surgeon: [name], MD;  Location: [facility] CATH LAB;  Service: Cardiovascular;  Laterality: N/A;  . Cataract extraction  07-31-11 and 09-18-11  . Hernia repair  1955  . Cholecystectomy  09-07-14  . Trigger finger release  01-24-15  . Carpal tunnel release  04-04-15    [facility]  . Wound debridement Left 06/07/2015    Procedure: DEBRIDEMENT WOUND;  Surgeon: Robert Bellow, MD;  Location: [facility] ORS;  Service: General;  Laterality: Left;  left upper back  . Application of wound vac Left 06/07/2015    Procedure: APPLICATION OF WOUND VAC;  Surgeon: Robert Bellow, MD;  Location: [facility] ORS;  Service: General;  Laterality: Left;  left upper back  Family History  Problem Relation Age of Onset  . CAD    . Heart attack Mother   . Hyperlipidemia Mother     Social History History  Substance Use Topics  . Smoking status: Former Smoker -- 4 years  . Smokeless tobacco: Never Used     Comment: Quit 2011  . Alcohol Use: No     Comment: occasional    Allergies  Allergen Reactions  . Sulfonamide Derivatives Rash    Current Outpatient Prescriptions  Medication Sig Dispense Refill  . amLODipine (NORVASC) 5 MG tablet Take 1 tablet (5 mg total) by mouth daily. 90 tablet 3  . aspirin 81 MG tablet Take 81 mg by mouth daily.      Marland Kitchen BAYER CONTOUR NEXT TEST test strip     . cefpodoxime (VANTIN) 200 MG tablet Take 1 tablet (200 mg total) by mouth 2 (two) times daily. 20 tablet 0  . Cholecalciferol (VITAMIN D3) 1000 UNITS CAPS Take 1,000 Units by mouth daily.     . Colesevelam HCl (WELCHOL) 3.75 G PACK Take 3.75 g by mouth daily. Mix in [place] of water and drink.    . hydrochlorothiazide 25 MG tablet Take 1 tablet (25 mg total) by mouth  daily. 90 tablet 3  . HYDROcodone-acetaminophen (NORCO/VICODIN) 5-325 MG per tablet Take 1 tablet by mouth every 6 (six) hours as needed for moderate pain.    Marland Kitchen lisinopril (PRINIVIL,ZESTRIL) 10 MG tablet TAKE 1 TABLET DAILY 90 tablet 3  . metFORMIN (GLUCOPHAGE) 500 MG tablet Take 1 tablet by mouth 2 (two) times daily.     . metoprolol succinate (TOPROL-XL) 25 MG 24 hr tablet Take 25 mg by mouth every evening.    . Multiple Vitamin (MULTIVITAMIN) capsule Take 1 capsule by mouth daily.      Marland Kitchen omeprazole (PRILOSEC) 40 MG capsule Take 40 mg by mouth 2 (two) times daily.    . simvastatin (ZOCOR) 40 MG tablet Take 1 tablet (40 mg total) by mouth at bedtime. 90 tablet 3   No current facility-administered medications for this visit.    Review of Systems Review of Systems  Constitutional: Negative.   Respiratory: Negative.   Cardiovascular: Negative.     Blood pressure 130/60, pulse 78, resp. rate 20, height '5\' 9"'$  (1.753 m), weight 185 lb (83.915 kg).  Physical Exam Physical Exam  Constitutional: He is oriented to person, place, and time. He appears well-developed and well-nourished.  Pulmonary/Chest:    1 x 4 cm wound, small amount of proteinaceous material removed with a Q-tip. Better than 50% granulated. No more than 5 mm in depth. Silver nitrate applied.  Neurological: He is alert and oriented to person, place, and time.  Skin: Skin is warm and dry.      Assessment    Study improvement in healing left back abscess.    Plan    The patient's wife will continue to apply dry dressing daily after showers and a thin film of Polysporin ointment. Patient may resume regular exercise activity.   Patient to return in three weeks  PCP:  Chrismon, Jaquelyn Bitter 07/13/2015, 9:02 AM

## 2015-07-14 ENCOUNTER — Ambulatory Visit (INDEPENDENT_AMBULATORY_CARE_PROVIDER_SITE_OTHER): Payer: Medicare Other | Admitting: Family Medicine

## 2015-07-14 ENCOUNTER — Encounter: Payer: Self-pay | Admitting: Family Medicine

## 2015-07-14 VITALS — BP 102/52 | HR 58 | Temp 97.6°F | Resp 16 | Wt 184.0 lb

## 2015-07-14 DIAGNOSIS — J189 Pneumonia, unspecified organism: Secondary | ICD-10-CM | POA: Diagnosis not present

## 2015-07-14 DIAGNOSIS — I10 Essential (primary) hypertension: Secondary | ICD-10-CM

## 2015-07-14 DIAGNOSIS — E119 Type 2 diabetes mellitus without complications: Secondary | ICD-10-CM | POA: Diagnosis not present

## 2015-07-14 DIAGNOSIS — E785 Hyperlipidemia, unspecified: Secondary | ICD-10-CM | POA: Diagnosis not present

## 2015-07-14 NOTE — Progress Notes (Signed)
Patient ID: Howard [name], male   DOB: 1931-08-01, 81 y.o.   MRN: [id number]       Patient: Howard [name] Male    DOB: 1931/05/26   81 y.o.   MRN: [id number] Visit Date: 07/14/2015  Today's Provider: [name], PA   Chief Complaint  Patient presents with  . Bronchitis    Patient is here for follow up visit from 06/23/15, paitent was diagnosed with bronchitis and advised to take Mucinex he reports that cough has improved   Subjective:    HPI  Patient is here to follow up on bronchitis. He is feeling much better and is not complaining of any symptoms. Patient has not heard of xray results and wants to discuss that today.    Allergies  Allergen Reactions  . Sulfonamide Derivatives Rash   Previous Medications   AMLODIPINE (NORVASC) 5 MG TABLET    Take 1 tablet (5 mg total) by mouth daily.   ASPIRIN 81 MG TABLET    Take 81 mg by mouth daily.     BAYER CONTOUR NEXT TEST TEST STRIP       CHOLECALCIFEROL (VITAMIN D3) 1000 UNITS CAPS    Take 1,000 Units by mouth daily.    COLESEVELAM HCL (WELCHOL) 3.75 G PACK    Take 3.75 g by mouth daily. Mix in [place] of water and drink.   HYDROCHLOROTHIAZIDE 25 MG TABLET    Take 1 tablet (25 mg total) by mouth daily.   HYDROCODONE-ACETAMINOPHEN (NORCO/VICODIN) 5-325 MG PER TABLET    Take 1 tablet by mouth every 6 (six) hours as needed for moderate pain.   LISINOPRIL (PRINIVIL,ZESTRIL) 10 MG TABLET    TAKE 1 TABLET DAILY   METFORMIN (GLUCOPHAGE) 500 MG TABLET    Take 1 tablet by mouth 2 (two) times daily.    METOPROLOL SUCCINATE (TOPROL-XL) 25 MG 24 HR TABLET    Take 25 mg by mouth every evening.   MULTIPLE VITAMIN (MULTIVITAMIN) CAPSULE    Take 1 capsule by mouth daily.     OMEPRAZOLE (PRILOSEC) 40 MG CAPSULE    Take 40 mg by mouth 2 (two) times daily.   SIMVASTATIN (ZOCOR) 40 MG TABLET    Take 1 tablet (40 mg total) by mouth at bedtime.    Review of Systems  Constitutional: Negative for fever, chills, fatigue and unexpected weight change.    Respiratory: Negative for apnea, cough, choking, chest tightness, shortness of breath and wheezing.   Cardiovascular: Negative for chest pain, palpitations and leg swelling.    History  Substance Use Topics  . Smoking status: Former Smoker -- 4 years  . Smokeless tobacco: Never Used     Comment: Quit 2011  . Alcohol Use: No     Comment: occasional   Objective:   BP 102/52 mmHg  Pulse 58  Temp(Src) 97.6 F (36.4 C) (Oral)  Resp 16  Wt 184 lb (83.462 kg)  SpO2 93%  Physical Exam  Constitutional: He is oriented to person, place, and time. He appears well-developed and well-nourished. No distress.  HENT:  Head: Normocephalic and atraumatic.  Right Ear: Hearing normal.  Left Ear: Hearing normal.  Nose: Nose normal.  Eyes: Conjunctivae and lids are normal. Right eye exhibits no discharge. Left eye exhibits no discharge. No scleral icterus.  Cardiovascular: Normal rate and regular rhythm.   Pulmonary/Chest: Effort normal and breath sounds normal. No respiratory distress.  Musculoskeletal: Normal range of motion.  Neurological: He is alert and oriented to person, place, and  time.  Skin: Skin is intact. No lesion and no rash noted.  Psychiatric: He has a normal mood and affect. His speech is normal and behavior is normal. Thought content normal.      Assessment & Plan:     1. Basal pneumonia Finished the Vantin and Mucinex. Feeling much improved without any significant cough or congestion now. Follow up CXR on 07-12-15 showed improvement in left basilar infiltrate with residula linular opacity and right mid lung 9 mm nodule unchanged. Radiologist recommended repeat CXR in 6-8 weeks to check for changes. Patient agreed and future order placed in chart. - DG Chest 2 View; Future  2. Diabetes mellitus without complication Stabilizing since treating basilar pneumonia and abscess on left back. Still on Metformin 500 mg BID and Simvastatin40 mg qd. Schedule future labs in 6-8 weeks.  -  Hemoglobin A1c; Future - Comprehensive metabolic panel; Future - CBC with Differential/Platelet; Future  3. Hyperlipidemia Tolerating diet and Simvastatin 40 mg qd. Will check labs in 6-8 weeks. Future orders placed in chart. Recheck pending reports. - TSH; Future - Lipid panel; Future  4. Essential hypertension Very good control. Tolerating Amlodipine 5 mg qd, HCTZ 25 mg qd. Lisinopril 10 mg qd and Metoprolol 25 mg qd. Recheck routine labs in 6-8 weeks. Future orders placed in chart. - Comprehensive metabolic panel; Future - CBC with Differential/Platelet; [place], PA  [facility]

## 2015-07-27 ENCOUNTER — Ambulatory Visit: Payer: Medicare Other | Admitting: Neurology

## 2015-07-28 ENCOUNTER — Encounter: Payer: Self-pay | Admitting: Cardiology

## 2015-08-01 ENCOUNTER — Ambulatory Visit: Payer: Medicare Other | Admitting: Neurology

## 2015-08-03 ENCOUNTER — Ambulatory Visit (INDEPENDENT_AMBULATORY_CARE_PROVIDER_SITE_OTHER): Payer: Medicare Other | Admitting: General Surgery

## 2015-08-03 VITALS — BP 140/68 | HR 68 | Resp 20 | Ht 69.0 in | Wt 184.0 lb

## 2015-08-03 DIAGNOSIS — C44219 Basal cell carcinoma of skin of left ear and external auricular canal: Secondary | ICD-10-CM | POA: Diagnosis not present

## 2015-08-03 DIAGNOSIS — D0461 Carcinoma in situ of skin of right upper limb, including shoulder: Secondary | ICD-10-CM | POA: Diagnosis not present

## 2015-08-03 DIAGNOSIS — Z85828 Personal history of other malignant neoplasm of skin: Secondary | ICD-10-CM | POA: Diagnosis not present

## 2015-08-03 DIAGNOSIS — X32XXXA Exposure to sunlight, initial encounter: Secondary | ICD-10-CM | POA: Diagnosis not present

## 2015-08-03 DIAGNOSIS — C44329 Squamous cell carcinoma of skin of other parts of face: Secondary | ICD-10-CM | POA: Diagnosis not present

## 2015-08-03 DIAGNOSIS — L570 Actinic keratosis: Secondary | ICD-10-CM | POA: Diagnosis not present

## 2015-08-03 DIAGNOSIS — L02212 Cutaneous abscess of back [any part, except buttock]: Secondary | ICD-10-CM

## 2015-08-03 DIAGNOSIS — D485 Neoplasm of uncertain behavior of skin: Secondary | ICD-10-CM | POA: Diagnosis not present

## 2015-08-03 NOTE — Patient Instructions (Signed)
Patient to return as needed. 

## 2015-08-03 NOTE — Progress Notes (Addendum)
Patient ID: Howard [name], male   DOB: 12-11-1931, 81 y.o.   MRN: [id number]  Chief Complaint  Patient presents with  . Follow-up    back abscess     HPI Howard [name] is a 81 y.o. male  male here today foolwoing up from his back abscess. Patient states he is doing well at this time HPI  Past Medical History  Diagnosis Date  . Hyperlipidemia   . Hypertension   . Complete heart block     a. 11/2010 s/p SJM 2210 [facility] DC PPM, ser# [id number].  . WPW (Wolff-Parkinson-White syndrome)     a. S/P RFCA 1991.  . Sleep apnea     a. cpap  . Diabetes mellitus without complication   . LBBB (left bundle branch block)   . GERD (gastroesophageal reflux disease)   . Arthritis   . Cancer     prostate and skin  . NICM (nonischemic cardiomyopathy)     a. 07/2014 Echo: EF 35-40%, mid-apicalanteroseptal DK, Gr 1 DD, mild-mod dil LA.  Marland Kitchen Chronic combined systolic and diastolic CHF, NYHA class 1     a. 07/2014 Echo: EF 35-40%, Gr 1 DD.  Marland Kitchen Vertigo   . Depression   . Non-obstructive CAD     a. 07/2014 Abnl MV;  b. 08/2014 Cath: LM nl, LAD 30p, RI 40p, LCX nl, OM1 40, RCA dominant 30p, 70d-->Med Rx.  . Left-sided Bell's palsy   . Poor balance   . Fall 11-10-14    Past Surgical History  Procedure Laterality Date  . Ruptured disc      1962 and 1998  . Cervical fusion    . Knee surgery      left knee 1991 and 1992; right knee 1995  . Prostate surgery      cancer--1998  . Replacement total knee      2004  . Hand surgery      right 1993; left 2005  . Back surgery      2011  . Pacemaker insertion      PPM-- St Jude 11/30/10 by [name]  . Catheter ablation  1991    for WPW  . Joint replacement Left 2013    knee  . Joint replacement Right 2004    knee  . Lumbar laminectomy/decompression microdiscectomy N/A 06/07/2014    Procedure: LUMBAR FOUR TO FIVE LUMBAR LAMINECTOMY/DECOMPRESSION MICRODISCECTOMY 1 LEVEL;  Surgeon: [name], MD;  Location: [place] [facility] OR[facility];  Service: Neurosurgery;  Laterality: N/A;   . Insert / replace / remove pacemaker    . Cardiac catheterization  08/26/2014    Single vessel obstructive CAD  . Left heart catheterization with coronary angiogram N/A 08/26/2014    Procedure: LEFT HEART CATHETERIZATION WITH CORONARY ANGIOGRAM;  Surgeon: [name], MD;  Location: [facility] CATH LAB;  Service: Cardiovascular;  Laterality: N/A;  . Cataract extraction  07-31-11 and 09-18-11  . Hernia repair  1955  . Cholecystectomy  09-07-14  . Trigger finger release  01-24-15  . Carpal tunnel release  04-04-15    [facility]  . Wound debridement Left 06/07/2015    Procedure: DEBRIDEMENT WOUND;  Surgeon: Robert Bellow, MD;  Location: [facility] ORS;  Service: General;  Laterality: Left;  left upper back  . Application of wound vac Left 06/07/2015    Procedure: APPLICATION OF WOUND VAC;  Surgeon: Robert Bellow, MD;  Location: [facility] ORS;  Service: General;  Laterality: Left;  left upper back    Family History  Problem Relation Age of Onset  . CAD    . Heart attack Mother   . Hyperlipidemia Mother     Social History Social History  Substance Use Topics  . Smoking status: Former Smoker -- 4 years  . Smokeless tobacco: Never Used     Comment: Quit 2011  . Alcohol Use: No     Comment: occasional    Allergies  Allergen Reactions  . Sulfonamide Derivatives Rash    Current Outpatient Prescriptions  Medication Sig Dispense Refill  . amLODipine (NORVASC) 5 MG tablet Take 1 tablet (5 mg total) by mouth daily. 90 tablet 3  . aspirin 81 MG tablet Take 81 mg by mouth daily.      Marland Kitchen BAYER CONTOUR NEXT TEST test strip     . cefpodoxime (VANTIN) 200 MG tablet Take 1 tablet (200 mg total) by mouth 2 (two) times daily. 20 tablet 0  . Cholecalciferol (VITAMIN D3) 1000 UNITS CAPS Take 1,000 Units by mouth daily.     . Colesevelam HCl (WELCHOL) 3.75 G PACK Take 3.75 g by mouth daily. Mix in [place] of water and drink.    . hydrochlorothiazide 25 MG tablet Take 1 tablet (25 mg total) by mouth daily. 90 tablet 3  .  HYDROcodone-acetaminophen (NORCO/VICODIN) 5-325 MG per tablet Take 1 tablet by mouth every 6 (six) hours as needed for moderate pain.    Marland Kitchen lisinopril (PRINIVIL,ZESTRIL) 10 MG tablet TAKE 1 TABLET DAILY 90 tablet 3  . metFORMIN (GLUCOPHAGE) 500 MG tablet Take 1 tablet by mouth 2 (two) times daily.     . metoprolol succinate (TOPROL-XL) 25 MG 24 hr tablet Take 25 mg by mouth every evening.    . Multiple Vitamin (MULTIVITAMIN) capsule Take 1 capsule by mouth daily.      Marland Kitchen omeprazole (PRILOSEC) 40 MG capsule Take 40 mg by mouth 2 (two) times daily.    . simvastatin (ZOCOR) 40 MG tablet Take 1 tablet (40 mg total) by mouth at bedtime. 90 tablet 3   No current facility-administered medications for this visit.    Review of Systems Review of Systems  Constitutional: Negative.   Respiratory: Negative.   Cardiovascular: Negative.     Blood pressure 140/68, pulse 68, resp. rate 20, height '5\' 9"'$  (1.753 m), weight 184 lb (83.462 kg).  Physical Exam Physical Exam  Constitutional: He is oriented to person, place, and time. He appears well-developed and well-nourished.  Pulmonary/Chest:    Neurological: He is alert and oriented to person, place, and time.  Skin: Skin is dry.       Assessment    Continued improvement of left back abscess.    Plan    I anticipate this will close within the next one-2 weeks. Follow-up will be on an as-needed basis.    Patient to return  As needed.  PCP:  Katrine Coho 08/14/2015, 9:35 AM

## 2015-08-04 ENCOUNTER — Encounter: Payer: Self-pay | Admitting: Internal Medicine

## 2015-08-08 ENCOUNTER — Ambulatory Visit: Payer: Self-pay | Admitting: Family Medicine

## 2015-08-21 ENCOUNTER — Telehealth: Payer: Self-pay

## 2015-08-21 NOTE — Telephone Encounter (Signed)
Refill request received  from [facility] requesting Metformin 500 mg tablets.

## 2015-08-22 DIAGNOSIS — G4733 Obstructive sleep apnea (adult) (pediatric): Secondary | ICD-10-CM | POA: Diagnosis not present

## 2015-08-22 MED ORDER — METFORMIN HCL 500 MG PO TABS: 500.0000 mg | ORAL_TABLET | Freq: Two times a day (BID) | ORAL | Status: DC

## 2015-08-22 NOTE — Telephone Encounter (Signed)
Metformin refilled. Remind patient to get labs done as planned.

## 2015-08-22 NOTE — Telephone Encounter (Signed)
Patient advised as directed below. Patient verbalized understanding.  

## 2015-08-23 ENCOUNTER — Ambulatory Visit (INDEPENDENT_AMBULATORY_CARE_PROVIDER_SITE_OTHER): Payer: Medicare Other | Admitting: Neurology

## 2015-08-23 ENCOUNTER — Encounter: Payer: Self-pay | Admitting: Neurology

## 2015-08-23 VITALS — BP 138/67 | HR 65 | Resp 16 | Ht 69.0 in | Wt 185.0 lb

## 2015-08-23 DIAGNOSIS — A499 Bacterial infection, unspecified: Secondary | ICD-10-CM | POA: Diagnosis not present

## 2015-08-23 DIAGNOSIS — G510 Bell's palsy: Secondary | ICD-10-CM

## 2015-08-23 DIAGNOSIS — B9689 Other specified bacterial agents as the cause of diseases classified elsewhere: Secondary | ICD-10-CM

## 2015-08-23 DIAGNOSIS — L089 Local infection of the skin and subcutaneous tissue, unspecified: Secondary | ICD-10-CM | POA: Diagnosis not present

## 2015-08-23 NOTE — Progress Notes (Signed)
Subjective:    Patient ID: Howard [name] is a 81 y.o. male.  HPI     Interim history:   Howard [name] is a very pleasant 81 year old right-handed gentleman with an underlying medical history of complete heart block, status post pacemaker placement in 2011, nonischemic cardiomyopathy, sleep apnea, depression, arthritis, prostate cancer, syncope, hyperlipidemia, diabetes, hypertension, reflux disease, lumbar spinal stenosis, hyperlipidemia, and coronary artery disease, who presents for follow-up consultation of his Bell's palsy. The patient is accompanied by his wife again today. I first met him on 04/26/2015 at the request of his primary care provider, at which time the patient reported new onset left facial weakness for which he had been seen at [facility] emergency room on 04/12/2015. He was diagnosed with Bell's palsy at the time and treated with steroids and an antiviral medication. I ordered a head CT with and without contrast. His exam was otherwise nonfocal. He had a CT head with and without contrast on 05/01/2015:  The CT scan of the head with and without contrast shows age related small vessel ischemic change and atrophy, to an extent typical for his age. There are no acute findings. No changes compared to the CT 04/12/2015.  In addition, I personally reviewed the images through the PACS system and agree with the findings.  His wife was contacted with the results on 05/02/2015.  Today, 08/23/2015: He reports doing well with regards to his L Bell's palsy. However, unfortunately, he developed a skin infection on the left side of his back and this developed into an abscess. He saw his dermatologist. He had it lanced first and was placed on anti-biotics which were then changed based on culture. Unfortunately, he had a prolonged issue with a very deep-seated abscess and finally had to see a Psychologist, sport and exercise. He was then treated for his deep wound with a wound VAC. This started in May of  this year and he had a protracted course of weeks. He has been released from wound care. In the past 2 or 3 weeks he has done well. His scar is healing still. He has no new neurologic complaints. He feels lightheaded at times. He does not typically use a cane. He has a long-standing history of vertigo.  Previously:  04/26/2015: He presented to [facility] emergency room on 04/12/2015 with new onset left facial weakness and he was diagnosed with Bell's palsy. He was prescribed 2 different medications which I am assuming was a steroid and an antiviral medication.  He finished his medications. He does endorse that he was given steroids and antiviral medication. He feels perhaps a little improved in that his ear on the left side no longer hurts as badly. He reports left-sided ear pain which started March. He has seen an ENT specialist, Dr. Tami Ribas out of [facility] and was given steroid eardrops. He was told that his left ear canal looked inflamed. I do not have records available for review from Dr. Ileene Hutchinson office. He also had more hearing loss on the left. Of note, he has had bilateral hearing aids for about 20 years. He has had multiple surgeries. He brought in a list of his surgeries. He had hernia surgery in 1955, ruptured disc in 1962, cervical fusion in 86, bilateral knee replacement surgeries in 1991 and 1992 on the left in 1995 on the right. He had prostatectomy 1998, he had a ruptured disc in 1999, he had a repeat right knee replacement in 2004. He had an accident to  his left hand in 2005. He had spine surgery in 2011 and pacemaker placed in December 2011. He had trigger finger surgery bilaterally. He had cataract repairs. He had knee replacement surgery in 2013. He had lumbar laminectomy in June 2015. Gallbladder was removed in September 2015. Trigger finger surgery was performed at therapy. 2016 and most recently had carpal tunnel surgery on 04/04/2015 on the right. He has a  history of recurrent vertigo which started in 1981. He had sudden vertigo in November. Of note, he had a fall downstairs a whole flight of stairs in November 2015 and was taken to [facility]. Thankfully he did not have any major injuries. His left facial weakness was noticeable on 04/12/2015. He noticed in the evening that he could not close his left eye and his wife noticed that his face looked crooked. She gave him a adult size aspirin and called 911. He was taken to the hospital. He had a head CT without contrast on 04/12/2015 and I reviewed the report: No intracranial hemorrhage or visible infarct, normal for age appearance of the brain. He reports no one-sided weakness or numbness or headaches. He has an echocardiogram scheduled for next week as I understand.  His Past Medical History Is Significant For: Past Medical History  Diagnosis Date  . Hyperlipidemia   . Hypertension   . Complete heart block     a. 11/2010 s/p SJM 2210 [employer] DC PPM, ser# [id number].  . WPW (Wolff-Parkinson-White syndrome)     a. S/P RFCA 1991.  . Sleep apnea     a. cpap  . Diabetes mellitus without complication   . LBBB (left bundle branch block)   . GERD (gastroesophageal reflux disease)   . Arthritis   . Cancer     prostate and skin  . NICM (nonischemic cardiomyopathy)     a. 07/2014 Echo: EF 35-40%, mid-apicalanteroseptal DK, Gr 1 DD, mild-mod dil LA.  Marland Kitchen Chronic combined systolic and diastolic CHF, NYHA class 1     a. 07/2014 Echo: EF 35-40%, Gr 1 DD.  Marland Kitchen Vertigo   . Depression   . Non-obstructive CAD     a. 07/2014 Abnl MV;  b. 08/2014 Cath: LM nl, LAD 30p, RI 40p, LCX nl, OM1 40, RCA dominant 30p, 70d-->Med Rx.  . Left-sided Bell's palsy   . Poor balance   . Fall 11-10-14    His Past Surgical History Is Significant For: Past Surgical History  Procedure Laterality Date  . Ruptured disc      1962 and 1998  . Cervical fusion    . Knee surgery      left knee 1991 and 1992; right knee 1995  .  Prostate surgery      cancer--1998  . Replacement total knee      2004  . Hand surgery      right 1993; left 2005  . Back surgery      2011  . Pacemaker insertion      PPM-- [employer] Jude 11/30/10 by [name]  . Catheter ablation  1991    for WPW  . Joint replacement Left 2013    knee  . Joint replacement Right 2004    knee  . Lumbar laminectomy/decompression microdiscectomy N/A 06/07/2014    Procedure: LUMBAR FOUR TO FIVE LUMBAR LAMINECTOMY/DECOMPRESSION MICRODISCECTOMY 1 LEVEL;  Surgeon: [name], MD;  Location: [place] NEURO ORS;  Service: Neurosurgery;  Laterality: N/A;  . Insert / replace / remove pacemaker    . Cardiac catheterization  08/26/2014    Single vessel obstructive CAD  . Left heart catheterization with coronary angiogram N/A 08/26/2014    Procedure: LEFT HEART CATHETERIZATION WITH CORONARY ANGIOGRAM;  Surgeon: [name], MD;  Location: [facility] CATH LAB;  Service: Cardiovascular;  Laterality: N/A;  . Cataract extraction  07-31-11 and 09-18-11  . Hernia repair  1955  . Cholecystectomy  09-07-14  . Trigger finger release  01-24-15  . Carpal tunnel release  04-04-15    [facility]  . Wound debridement Left 06/07/2015    Procedure: DEBRIDEMENT WOUND;  Surgeon: Robert Bellow, MD;  Location: [facility] ORS;  Service: General;  Laterality: Left;  left upper back  . Application of wound vac Left 06/07/2015    Procedure: APPLICATION OF WOUND VAC;  Surgeon: Robert Bellow, MD;  Location: [facility] ORS;  Service: General;  Laterality: Left;  left upper back    His Family History Is Significant For: Family History  Problem Relation Age of Onset  . CAD    . Heart attack Mother   . Hyperlipidemia Mother     His Social History Is Significant For: Social History   Social History  . Marital Status: Married    Spouse Name: N/A  . Number of Children: 2  . Years of Education: College   Occupational History  . Retired    Social History Main Topics  . Smoking status: Former Smoker -- 4 years  . Smokeless  tobacco: Never Used     Comment: Quit 2011  . Alcohol Use: No     Comment: occasional  . Drug Use: No  . Sexual Activity: Not Asked   Other Topics Concern  . None   Social History Narrative   Drinks 2 cups of coffee a day     His Allergies Are:  Allergies  Allergen Reactions  . Sulfonamide Derivatives Rash  :   His Current Medications Are:  Outpatient Encounter Prescriptions as of 08/23/2015  Medication Sig  . amLODipine (NORVASC) 5 MG tablet Take 1 tablet (5 mg total) by mouth daily.  Marland Kitchen aspirin 81 MG tablet Take 81 mg by mouth daily.    Marland Kitchen BAYER CONTOUR NEXT TEST test strip   . Cholecalciferol (VITAMIN D3) 1000 UNITS CAPS Take 1,000 Units by mouth daily.   . Colesevelam HCl (WELCHOL) 3.75 G PACK Take 3.75 g by mouth daily. Mix in [place] of water and drink.  . hydrochlorothiazide 25 MG tablet Take 1 tablet (25 mg total) by mouth daily.  Marland Kitchen lisinopril (PRINIVIL,ZESTRIL) 10 MG tablet TAKE 1 TABLET DAILY  . metFORMIN (GLUCOPHAGE) 500 MG tablet Take 1 tablet (500 mg total) by mouth 2 (two) times daily.  . metoprolol succinate (TOPROL-XL) 25 MG 24 hr tablet Take 25 mg by mouth every evening.  . Multiple Vitamin (MULTIVITAMIN) capsule Take 1 capsule by mouth daily.    Marland Kitchen omeprazole (PRILOSEC) 40 MG capsule Take 40 mg by mouth 2 (two) times daily.  . simvastatin (ZOCOR) 40 MG tablet Take 1 tablet (40 mg total) by mouth at bedtime.  . [DISCONTINUED] cefpodoxime (VANTIN) 200 MG tablet Take 1 tablet (200 mg total) by mouth 2 (two) times daily.  . [DISCONTINUED] HYDROcodone-acetaminophen (NORCO/VICODIN) 5-325 MG per tablet Take 1 tablet by mouth every 6 (six) hours as needed for moderate pain.   No facility-administered encounter medications on file as of 08/23/2015.  :  Review of Systems:  Out of a complete 14 point review of systems, all are reviewed and negative with the exception of  these symptoms as listed below:   Review of Systems  Skin:       Since last visit, patient went to  hospital for infectious cyst which was surgically removed. Wound vac used for healing. Patient discharged from wound care 2 weeks ago.   Neurological:       Patient states that syptoms of Bell's Palsy have resolved.   Wife states that the patient is more "off balance", especially if standing from a sitting position. Patient reports H/O vertigo.     Objective:  Neurologic Exam  Physical Exam Physical Examination:   Filed Vitals:   08/23/15 1133  BP: 138/67  Pulse: 65  Resp:    He has no orthostatic changes or symptoms: sitting 123/66, P 59, 138/67, P 65.  General Examination: The patient is a very pleasant 81 y.o. male in no acute distress. He appears well-developed and well-nourished and well groomed. He does not have any vertiginous symptoms upon standing or changing head positions today.  HEENT: Normocephalic, atraumatic, pupils are equal, round and reactive to light and accommodation. Funduscopic exam is normal with sharp disc margins noted. Extraocular tracking is good without limitation to gaze excursion or nystagmus noted. Normal smooth pursuit is noted. Hearing isimpaired bilaterally and he has hearing aids in place bilaterally. He is status post cataract repairs. TM is clear on the right but I could not see the tympanic membrane clearly on the left. He does not have any redness in his ear canals or blisters. Face is fairly symmetric with  no significant left-sided abnormality noted. He has no Bell's phenomenon. He has normal facial sensation. Speech is clear with no dysarthria noted. There is no hypophonia. There is no lip, neck/head, jaw or voice tremor. Neck is supple with full range of passive and active motion. There are no carotid bruits on auscultation. Oropharynx exam reveals: mild mouth dryness, adequate dental hygiene and mild airway crowding, due to redundant soft palate.   Chest: Clear to auscultation without wheezing, rhonchi or crackles noted.  Heart: S1+S2+0, regular  and normal without murmurs, rubs or gallops noted.   Abdomen: Soft, non-tender and non-distended with normal bowel sounds appreciated on auscultation.  Extremities: There is no pitting edema in the distal lower extremities bilaterally. Pedal pulses are intact.  Skin: Warm and dry without trophic changes noted. There are no varicose veins. He has a healing scar on the left side of his back by the shoulder blade.   Musculoskeletal: exam reveals no obvious joint deformities, tenderness or joint swelling or erythema.   Neurologically:  Mental status: The patient is awake, alert and oriented in all 4 spheres. His immediate and remote memory, attention, language skills and fund of knowledge are appropriate. There is no evidence of aphasia, agnosia, apraxia or anomia. Speech is clear with normal prosody and enunciation. Thought process is linear. Mood is normal and affect is normal.  Cranial nerves II - XII are as described above under HEENT exam. In addition: shoulder shrug is normal with equal shoulder height noted. Motor exam: Normal bulk, strength and tone is noted. There is no drift, tremor or rebound. Romberg is negative. Reflexes are 2+ throughout. Babinski: Toes are flexor bilaterally. Fine motor skills and coordination: intact with normal finger taps, normal hand movements, normal rapid alternating patting, normal foot taps and normal foot agility.  Cerebellar testing: No dysmetria or intention tremor on finger to nose testing. Heel to shin is unremarkable bilaterally. There is no truncal or gait ataxia.  Sensory exam:  intact to light touch, pinprick, vibration, temperature sense in the upper and lower extremities.  Gait, station and balance: He stands easily. No veering to one side is noted. No leaning to one side is noted. Posture is age-appropriate and stance is narrow based. Gait shows normal stride length and normal pace. No problems turning are noted. He turns en bloc.   Assessment and  Plan:   In summary, Howard [name] is a very pleasant 81 year old male with an underlying complex medical history of complete heart block, status post pacemaker placement in 2011, nonischemic cardiomyopathy, sleep apnea, depression, arthritis, prostate cancer, syncope, hyperlipidemia, diabetes, hypertension, reflux disease, lumbar spinal stenosis, hyperlipidemia, and coronary artery disease, who presents for follow-up consultation of his Bell's palsy which was diagnosed in April of this year. He has recovered from it without sequelae. Unfortunately in the interim he developed a skin abscess which caused him complications and a prolonged course of anti-body aches and wound care. Thankfully he has overcome this as well. He has occasional dizziness and vertigo symptoms. He is advised to use a cane for safety and increase his water intake for better hydration. His neurological exam is nonfocal. We talked about his head CT results as well today.  At this juncture, I have advised him that  I can see him back on an as-needed basis. I answered all her questions today and the patient and his wife were in agreement. I spent 20 minutes in total face-to-face time with the patient, more than 50% of which was spent in counseling and coordination of care, reviewing test results, reviewing medication and discussing or reviewing the diagnosis of Bell's palsy, its prognosis and treatment options.

## 2015-08-23 NOTE — Patient Instructions (Signed)
For your balance, I would like for you to start using a cane and you need to be better hydrated with water. Your face looks good, no residual Bell's palsy.  I will you back as needed.

## 2015-09-15 ENCOUNTER — Ambulatory Visit (INDEPENDENT_AMBULATORY_CARE_PROVIDER_SITE_OTHER): Payer: Medicare Other | Admitting: Family Medicine

## 2015-09-15 ENCOUNTER — Encounter: Payer: Self-pay | Admitting: Family Medicine

## 2015-09-15 VITALS — BP 150/78 | HR 71 | Temp 98.4°F | Resp 16 | Wt 190.2 lb

## 2015-09-15 DIAGNOSIS — I10 Essential (primary) hypertension: Secondary | ICD-10-CM | POA: Diagnosis not present

## 2015-09-15 DIAGNOSIS — Z23 Encounter for immunization: Secondary | ICD-10-CM | POA: Diagnosis not present

## 2015-09-15 DIAGNOSIS — E119 Type 2 diabetes mellitus without complications: Secondary | ICD-10-CM

## 2015-09-15 DIAGNOSIS — E785 Hyperlipidemia, unspecified: Secondary | ICD-10-CM | POA: Diagnosis not present

## 2015-09-15 DIAGNOSIS — J189 Pneumonia, unspecified organism: Secondary | ICD-10-CM

## 2015-09-15 NOTE — Progress Notes (Signed)
[name], male   DOB: 1931/03/18, 81 y.o.   MRN: [id number]    Subjective:  Hypertension This is a chronic problem. The current episode started more than 1 year ago. The problem is controlled. Pertinent negatives include no blurred vision, chest pain, shortness of breath or sweats. Risk factors for coronary artery disease include diabetes mellitus and dyslipidemia. Past treatments include ACE inhibitors, calcium channel blockers, diuretics and beta blockers.  Diabetes He presents for his follow-up diabetic visit. He has type 2 diabetes mellitus. His disease course has been stable. There are no hypoglycemic associated symptoms. Pertinent negatives for hypoglycemia include no sweats. Pertinent negatives for diabetes include no blurred vision, no chest pain, no foot paresthesias, no polydipsia, no polyphagia and no polyuria. There are no hypoglycemic complications. Symptoms are stable. He monitors blood glucose at home 3-4 x per week. Blood glucose monitoring compliance is good. His overall blood glucose range is 130-140 mg/dl.  Hyperlipidemia This is a chronic problem. The problem is controlled. Pertinent negatives include no chest pain, leg pain, myalgias or shortness of breath. Current antihyperlipidemic treatment includes diet change, statins and bile acid squestrants. There are no compliance problems.   Pneumonia There is no shortness of breath. The current episode started more than 1 month ago. The problem has been resolved. Pertinent negatives include no chest pain, myalgias, nasal congestion or sweats.   Prior to Admission medications   Medication Sig Start Date End Date Taking? Authorizing Provider  amLODipine (NORVASC) 5 MG tablet Take 1 tablet (5 mg total) by mouth daily. 03/21/11  Yes [name], MD  aspirin 81 MG tablet Take 81 mg by mouth daily.     Yes Historical Provider, MD  BAYER CONTOUR NEXT TEST test strip  05/09/15  Yes Historical Provider, MD  Cholecalciferol  (VITAMIN D3) 1000 UNITS CAPS Take 1,000 Units by mouth daily.    Yes Historical Provider, MD  Colesevelam HCl [facility]) 3.75 G PACK Take 3.75 g by mouth daily. Mix in [place] of water and drink.   Yes Historical Provider, MD  hydrochlorothiazide 25 MG tablet Take 1 tablet (25 mg total) by mouth daily. 03/21/11  Yes [name], MD  lisinopril (PRINIVIL,ZESTRIL) 10 MG tablet TAKE 1 TABLET DAILY 07/07/15  Yes [name], MD  metFORMIN (GLUCOPHAGE) 500 MG tablet Take 1 tablet (500 mg total) by mouth 2 (two) times daily. 08/22/15  Yes Dennis E Chrismon, PA  metoprolol succinate (TOPROL-XL) 25 MG 24 hr tablet Take 25 mg by mouth every evening.   Yes Historical Provider, MD  Multiple Vitamin (MULTIVITAMIN) capsule Take 1 capsule by mouth daily.     Yes Historical Provider, MD  omeprazole (PRILOSEC) 40 MG capsule Take 40 mg by mouth 2 (two) times daily.   Yes Historical Provider, MD  simvastatin (ZOCOR) 40 MG tablet Take 1 tablet (40 mg total) by mouth at bedtime. 10/25/14  Yes [name], MD   Patient Active Problem List   Diagnosis Date Noted  . GERD (gastroesophageal reflux disease) 06/16/2015  . Abscess of back 06/06/2015  . Vertigo 03/27/2015  . Carpal tunnel syndrome 10/28/2014  . Status post cholecystectomy 09/29/2014  . Nonischemic cardiomyopathy 09/26/2014  . Diabetes mellitus without complication   . Sleep apnea   . Spinal stenosis, lumbar region, with neurogenic claudication 06/07/2014  . Lumbar stenosis with neurogenic claudication 06/07/2014  . Abnormal gait 08/20/2012  . H/O total knee replacement 08/20/2012  . Arthritis of knee, degenerative 08/20/2012  . Pacemaker-St.Jude 08/03/2012  .  Cardiac conduction disorder 06/19/2012  . Acid reflux 06/18/2012  . 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 Triggering of digit 03/25/2012  . Hyperlipidemia 12/23/2011  . Essential hypertension 03/23/2011  . Complete heart block 03/23/2011   Past Medical History  Diagnosis Date  .  Hyperlipidemia   . Hypertension   . Complete heart block     a. 11/2010 s/p SJM 2210 [facility] DC PPM, ser# [id number].  . WPW (Wolff-Parkinson-White syndrome)     a. S/P RFCA 1991.  . Sleep apnea     a. cpap  . Diabetes mellitus without complication   . LBBB (left bundle branch block)   . GERD (gastroesophageal reflux disease)   . Arthritis   . Cancer     prostate and skin  . NICM (nonischemic cardiomyopathy)     a. 07/2014 Echo: EF 35-40%, mid-apicalanteroseptal DK, Gr 1 DD, mild-mod dil LA.  Marland Kitchen Chronic combined systolic and diastolic CHF, NYHA class 1     a. 07/2014 Echo: EF 35-40%, Gr 1 DD.  Marland Kitchen Vertigo   . Depression   . Non-obstructive CAD     a. 07/2014 Abnl MV;  b. 08/2014 Cath: LM nl, LAD 30p, RI 40p, LCX nl, OM1 40, RCA dominant 30p, 70d-->Med Rx.  . Left-sided Bell's palsy   . Poor balance   . Fall 11-10-14   Social History   Social History  . Marital Status: Married    Spouse Name: N/A  . Number of Children: 2  . Years of Education: College   Occupational History  . Retired    Social History Main Topics  . Smoking status: Former Smoker -- 4 years  . Smokeless tobacco: Never Used     Comment: Quit 2011  . Alcohol Use: No     Comment: occasional  . Drug Use: No  . Sexual Activity: Not on file   Other Topics Concern  . Not on file   Social History Narrative   Drinks 2 cups of coffee a day    Allergies  Allergen Reactions  . Sulfonamide Derivatives Rash   Review of Systems  Constitutional: Negative.   HENT: Negative.   Eyes: Negative.  Negative for blurred vision.  Respiratory: Negative.  Negative for shortness of breath.   Cardiovascular: Negative.  Negative for chest pain.  Gastrointestinal: Negative.   Genitourinary: Negative.   Musculoskeletal: Negative.  Negative for myalgias.  Skin: Negative.   Neurological: Negative.   Endo/Heme/Allergies: Negative.  Negative for polydipsia and polyphagia.  Psychiatric/Behavioral: Negative.    Immunization  History  Administered Date(s) Administered  . Influenza-Unspecified 08/23/2013   Objective:  BP 150/78 mmHg  Pulse 71  Temp(Src) 98.4 F (36.9 C) (Oral)  Resp 16  Wt 190 lb 3.2 oz (86.274 kg)  SpO2 95%  Physical Exam  Constitutional: He is oriented to person, place, and time and well-developed, well-nourished, and in no distress.  HENT:  Head: Normocephalic and atraumatic.  Eyes: Conjunctivae and EOM are normal.  Neck: Neck supple.  Cardiovascular: Normal rate and regular rhythm.   Pulmonary/Chest: Effort normal and breath sounds normal.  Abdominal: Soft. Bowel sounds are normal.  Neurological: He is alert and oriented to person, place, and time.  Psychiatric: Memory, affect and judgment normal.    Lab Results  Component Value Date   WBC 9.2 06/16/2015   HGB 12.5* 06/07/2015   HCT 39.1 06/16/2015   PLT 301 06/07/2015   GLUCOSE 202* 04/26/2015   CHOL  11/30/2010    145  ATP III CLASSIFICATION:  <200     mg/dL   Desirable  200-239  mg/dL   Borderline High  >=240    mg/dL   High          TRIG 116 11/30/2010   HDL 39* 11/30/2010   LDLCALC  11/30/2010    83        Total Cholesterol/HDL:CHD Risk Coronary Heart Disease Risk Table                     Men   Women  1/2 Average Risk   3.4   3.3  Average Risk       5.0   4.4  2 X Average Risk   9.6   7.1  3 X Average Risk  23.4   11.0        Use the calculated Patient Ratio above and the CHD Risk Table to determine the patient's CHD Risk.        ATP III CLASSIFICATION (LDL):  <100     mg/dL   Optimal  100-129  mg/dL   Near or Above                    Optimal  130-159  mg/dL   Borderline  160-189  mg/dL   High  >190     mg/dL   Very High   TSH 1.577 11/30/2010   INR 1.0 08/24/2014    CMP     Component Value Date/Time   NA 134 04/26/2015 1137   NA 137 11/10/2014 2041   NA 134* 08/24/2014 1256   K 4.4 04/26/2015 1137   K 3.9 11/10/2014 2041   CL 94* 04/26/2015 1137   CL 104 11/10/2014 2041   CO2 24  04/26/2015 1137   CO2 24 11/10/2014 2041   GLUCOSE 202* 04/26/2015 1137   GLUCOSE 154* 11/10/2014 2041   GLUCOSE 208* 08/24/2014 1256   BUN 12 04/26/2015 1137   BUN 13 11/10/2014 2041   BUN 16 08/24/2014 1256   CREATININE 0.71* 04/26/2015 1137   CREATININE 1.01 11/10/2014 2041   CALCIUM 9.5 04/26/2015 1137   CALCIUM 8.5 11/10/2014 2041   PROT 6.7 04/26/2015 1137   PROT 7.1 09/06/2014 0801   PROT 6.7 11/29/2010 1430   ALBUMIN 3.4 09/06/2014 0801   ALBUMIN 3.8 11/29/2010 1430   AST 16 04/26/2015 1137   AST 17 09/06/2014 0801   ALT 17 04/26/2015 1137   ALT 22 09/06/2014 0801   ALKPHOS 74 04/26/2015 1137   ALKPHOS 67 09/06/2014 0801   BILITOT 0.5 04/26/2015 1137   BILITOT 0.6 09/06/2014 0801   BILITOT 0.9 11/29/2010 1430   GFRNONAA 87 04/26/2015 1137   GFRNONAA >60 11/10/2014 2041   GFRNONAA >60 09/06/2014 0801   GFRAA 101 04/26/2015 1137   GFRAA >60 11/10/2014 2041   GFRAA >60 09/06/2014 0801   Assessment and Plan :  1. Essential hypertension Fair control of BP on Lisinopril 10 mg qd, HCTZ 25 mg qd, Amlodipine 5 mg qd and Toprol-XL 25 mg qd. Asymptomatic and will recheck routine labs. Follow up pending reports. - Comprehensive metabolic panel - CBC with Differential/Platelet  2. Diabetes mellitus without complication Stable with glucose ranging from 130-140 overall averages at home. Tolerating Metformin 500 mg 1 BID without side effects. Will check routine labs and follow up in 3 months. - Hemoglobin A1c - Comprehensive metabolic panel - CBC with Differential/Platelet  3. Hyperlipidemia Tolerating Welchol and Simvastatin without side effects.  Trying to follow low fat diabetic diet. Recheck labs and continue present dosages. Recheck pending reports. - TSH - Lipid panel  4. Basal pneumonia Follow up CXR to confirm complete resolution. No further cough, fever or congestion. - DG Chest 2 View  5. Need for influenza vaccination - Flu vaccine HIGH DOSE PF  [name] [facility] 09/15/2015 10:28 AM

## 2015-09-18 ENCOUNTER — Ambulatory Visit
Admission: RE | Admit: 2015-09-18 | Discharge: 2015-09-18 | Disposition: A | Discharge status: Home / Self Care | Payer: Medicare Other | Source: Ambulatory Visit | Attending: Family Medicine | Admitting: Family Medicine

## 2015-09-18 DIAGNOSIS — J189 Pneumonia, unspecified organism: Secondary | ICD-10-CM | POA: Insufficient documentation

## 2015-09-18 DIAGNOSIS — R911 Solitary pulmonary nodule: Secondary | ICD-10-CM | POA: Diagnosis not present

## 2015-09-18 DIAGNOSIS — E119 Type 2 diabetes mellitus without complications: Secondary | ICD-10-CM | POA: Diagnosis not present

## 2015-09-18 DIAGNOSIS — I10 Essential (primary) hypertension: Secondary | ICD-10-CM | POA: Diagnosis not present

## 2015-09-18 DIAGNOSIS — E785 Hyperlipidemia, unspecified: Secondary | ICD-10-CM | POA: Diagnosis not present

## 2015-09-18 DIAGNOSIS — J449 Chronic obstructive pulmonary disease, unspecified: Secondary | ICD-10-CM | POA: Diagnosis not present

## 2015-09-19 LAB — CBC WITH DIFFERENTIAL/PLATELET
BASOS ABS: 0 10*3/uL (ref 0.0–0.2)
Basos: 1 %
EOS (ABSOLUTE): 0.5 10*3/uL — ABNORMAL HIGH (ref 0.0–0.4)
Eos: 10 %
HEMOGLOBIN: 13.5 g/dL (ref 12.6–17.7)
Hematocrit: 41.7 % (ref 37.5–51.0)
Immature Grans (Abs): 0 10*3/uL (ref 0.0–0.1)
Immature Granulocytes: 0 %
LYMPHS ABS: 1.6 10*3/uL (ref 0.7–3.1)
Lymphs: 30 %
MCH: 24.7 pg — ABNORMAL LOW (ref 26.6–33.0)
MCHC: 32.4 g/dL (ref 31.5–35.7)
MCV: 76 fL — ABNORMAL LOW (ref 79–97)
MONOCYTES: 15 %
Monocytes Absolute: 0.8 10*3/uL (ref 0.1–0.9)
NEUTROS ABS: 2.4 10*3/uL (ref 1.4–7.0)
Neutrophils: 44 %
Platelets: 205 10*3/uL (ref 150–379)
RBC: 5.46 x10E6/uL (ref 4.14–5.80)
RDW: 15.6 % — ABNORMAL HIGH (ref 12.3–15.4)
WBC: 5.4 10*3/uL (ref 3.4–10.8)

## 2015-09-19 LAB — COMPREHENSIVE METABOLIC PANEL
ALBUMIN: 4.2 g/dL (ref 3.5–4.7)
ALT: 18 IU/L (ref 0–44)
AST: 21 IU/L (ref 0–40)
Albumin/Globulin Ratio: 1.7 (ref 1.1–2.5)
Alkaline Phosphatase: 57 IU/L (ref 39–117)
BILIRUBIN TOTAL: 0.3 mg/dL (ref 0.0–1.2)
BUN / CREAT RATIO: 14 (ref 10–22)
BUN: 11 mg/dL (ref 8–27)
CHLORIDE: 97 mmol/L (ref 97–108)
CO2: 23 mmol/L (ref 18–29)
Calcium: 9.2 mg/dL (ref 8.6–10.2)
Creatinine, Ser: 0.76 mg/dL (ref 0.76–1.27)
GFR calc Af Amer: 98 mL/min/{1.73_m2} (ref >59)
GFR calc non Af Amer: 85 mL/min/{1.73_m2} (ref >59)
GLOBULIN, TOTAL: 2.5 g/dL (ref 1.5–4.5)
GLUCOSE: 120 mg/dL — AB (ref 65–99)
Potassium: 4.3 mmol/L (ref 3.5–5.2)
SODIUM: 136 mmol/L (ref 134–144)
Total Protein: 6.7 g/dL (ref 6.0–8.5)

## 2015-09-19 LAB — LIPID PANEL
Chol/HDL Ratio: 2.9 ratio units (ref 0.0–5.0)
Cholesterol, Total: 133 mg/dL (ref 100–199)
HDL: 46 mg/dL (ref >39)
LDL Calculated: 51 mg/dL (ref 0–99)
Triglycerides: 181 mg/dL — ABNORMAL HIGH (ref 0–149)
VLDL Cholesterol Cal: 36 mg/dL (ref 5–40)

## 2015-09-19 LAB — TSH: TSH: 1.86 u[IU]/mL (ref 0.450–4.500)

## 2015-09-19 LAB — HGB A1C W/O EAG: Hgb A1c MFr Bld: 7.1 % — ABNORMAL HIGH (ref 4.8–5.6)

## 2015-09-21 DIAGNOSIS — C44219 Basal cell carcinoma of skin of left ear and external auricular canal: Secondary | ICD-10-CM | POA: Diagnosis not present

## 2015-09-22 ENCOUNTER — Telehealth: Payer: Self-pay

## 2015-09-22 ENCOUNTER — Ambulatory Visit: Payer: Medicare Other | Admitting: Family Medicine

## 2015-09-22 DIAGNOSIS — R911 Solitary pulmonary nodule: Secondary | ICD-10-CM

## 2015-09-22 NOTE — Telephone Encounter (Signed)
-----   Message from [employer], [place] sent at 09/22/2015  2:37 AM EDT ----- Hgb A1C near goal of 7.0 with good blood sugar level. Continue Metformin and diabetic diet. Cholesterol total, LDL and HDL in good shape. Triglycerides elevated above goal of <150. Continue cholesterol medication. Chest x-ray shows some left lower lung scarring from pneumonia and a right mid-lung nodule. Radiologist recommended CT scan of chest without contrast to evaluate nodule further.

## 2015-09-22 NOTE — Telephone Encounter (Signed)
Patient's wife Bolden Hagerman advised as directed below. Wife verbalized understanding and agrees to proceed with CT scan. CT ordered.

## 2015-09-26 DIAGNOSIS — C44329 Squamous cell carcinoma of skin of other parts of face: Secondary | ICD-10-CM | POA: Diagnosis not present

## 2015-09-26 DIAGNOSIS — L905 Scar conditions and fibrosis of skin: Secondary | ICD-10-CM | POA: Diagnosis not present

## 2015-09-27 ENCOUNTER — Ambulatory Visit
Admission: RE | Admit: 2015-09-27 | Discharge: 2015-09-27 | Disposition: A | Discharge status: Home / Self Care | Payer: Medicare Other | Source: Ambulatory Visit | Attending: Family Medicine | Admitting: Family Medicine

## 2015-09-27 ENCOUNTER — Other Ambulatory Visit: Payer: Self-pay

## 2015-09-27 DIAGNOSIS — M79671 Pain in right foot: Secondary | ICD-10-CM | POA: Diagnosis not present

## 2015-09-27 DIAGNOSIS — R911 Solitary pulmonary nodule: Secondary | ICD-10-CM | POA: Diagnosis not present

## 2015-09-27 DIAGNOSIS — R9389 Abnormal findings on diagnostic imaging of other specified body structures: Secondary | ICD-10-CM

## 2015-09-27 DIAGNOSIS — M216X1 Other acquired deformities of right foot: Secondary | ICD-10-CM | POA: Diagnosis not present

## 2015-09-27 DIAGNOSIS — I700 Atherosclerosis of aorta: Secondary | ICD-10-CM | POA: Insufficient documentation

## 2015-09-27 DIAGNOSIS — I672 Cerebral atherosclerosis: Secondary | ICD-10-CM | POA: Diagnosis not present

## 2015-09-27 DIAGNOSIS — D2371 Other benign neoplasm of skin of right lower limb, including hip: Secondary | ICD-10-CM | POA: Diagnosis not present

## 2015-09-28 ENCOUNTER — Other Ambulatory Visit: Payer: Self-pay | Admitting: Family Medicine

## 2015-09-28 DIAGNOSIS — R911 Solitary pulmonary nodule: Secondary | ICD-10-CM

## 2015-09-28 DIAGNOSIS — IMO0001 Reserved for inherently not codable concepts without codable children: Secondary | ICD-10-CM

## 2015-09-29 DIAGNOSIS — H26492 Other secondary cataract, left eye: Secondary | ICD-10-CM | POA: Diagnosis not present

## 2015-09-29 DIAGNOSIS — H35373 Puckering of macula, bilateral: Secondary | ICD-10-CM | POA: Diagnosis not present

## 2015-10-02 ENCOUNTER — Ambulatory Visit (INDEPENDENT_AMBULATORY_CARE_PROVIDER_SITE_OTHER): Payer: Medicare Other | Admitting: Internal Medicine

## 2015-10-02 ENCOUNTER — Encounter: Payer: Self-pay | Admitting: Internal Medicine

## 2015-10-02 VITALS — BP 138/70 | HR 66 | Ht 69.0 in | Wt 189.2 lb

## 2015-10-02 DIAGNOSIS — I10 Essential (primary) hypertension: Secondary | ICD-10-CM | POA: Diagnosis not present

## 2015-10-02 DIAGNOSIS — Z45018 Encounter for adjustment and management of other part of cardiac pacemaker: Secondary | ICD-10-CM

## 2015-10-02 DIAGNOSIS — I429 Cardiomyopathy, unspecified: Secondary | ICD-10-CM

## 2015-10-02 DIAGNOSIS — I442 Atrioventricular block, complete: Secondary | ICD-10-CM | POA: Diagnosis not present

## 2015-10-02 DIAGNOSIS — I459 Conduction disorder, unspecified: Secondary | ICD-10-CM

## 2015-10-02 DIAGNOSIS — I428 Other cardiomyopathies: Secondary | ICD-10-CM

## 2015-10-02 LAB — CUP PACEART INCLINIC DEVICE CHECK
Brady Statistic RA Percent Paced: 7.2 %
Brady Statistic RV Percent Paced: 99 %
Date Time Interrogation Session: 20161010135704
Lead Channel Impedance Value: 362.5 Ohm
Lead Channel Pacing Threshold Amplitude: 0.75 V
Lead Channel Pacing Threshold Amplitude: 0.75 V
Lead Channel Pacing Threshold Pulse Width: 0.4 ms
Lead Channel Sensing Intrinsic Amplitude: 12 mV
Lead Channel Setting Pacing Amplitude: 2 V
MDC IDC MSMT BATTERY REMAINING LONGEVITY: 58.8 mo
MDC IDC MSMT BATTERY VOLTAGE: 2.9 V
MDC IDC MSMT LEADCHNL RA IMPEDANCE VALUE: 462.5 Ohm
MDC IDC MSMT LEADCHNL RA SENSING INTR AMPL: 3.9 mV
MDC IDC MSMT LEADCHNL RV PACING THRESHOLD PULSEWIDTH: 0.8 ms
MDC IDC SET LEADCHNL RV PACING AMPLITUDE: 2.5 V
MDC IDC SET LEADCHNL RV PACING PULSEWIDTH: 0.8 ms
MDC IDC SET LEADCHNL RV SENSING SENSITIVITY: 4 mV
Pulse Gen Model: 2210
Pulse Gen Serial Number: 7196739

## 2015-10-02 NOTE — Progress Notes (Signed)
PCP: [name], PA Primary Cardiologist:  Dr [name] Primary EP:  [name] MD  Howard [name] is a 81 y.o. male who presents today for electrophysiology followup.  Since last being seen in our clinic, the patient reports doing reasonably well.  He has been diagnosed with a lung nodule and has plans to be seen in the thoracic clinic soon.  He continues cardiac rehab 3 days per week without difficulty.  Today, he denies symptoms of palpitations, chest pain, shortness of breath,  lower extremity edema, dizziness, presyncope, or syncope.  The patient is otherwise without complaint today. He lives in [place].  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  He prefers to have his device followed in our office.  Past Medical History  Diagnosis Date  . Hyperlipidemia   . Hypertension   . Complete heart block     a. 11/2010 s/p SJM 2210 [facility] DC PPM, ser# [id number].  . WPW (Wolff-Parkinson-White syndrome)     a. S/P RFCA 1991.  . Sleep apnea     a. cpap  . Diabetes mellitus without complication   . LBBB (left bundle branch block)   . GERD (gastroesophageal reflux disease)   . Arthritis   . Cancer     prostate and skin  . NICM (nonischemic cardiomyopathy)     a. 07/2014 Echo: EF 35-40%, mid-apicalanteroseptal DK, Gr 1 DD, mild-mod dil LA.  Marland Kitchen Chronic combined systolic and diastolic CHF, NYHA class 1     a. 07/2014 Echo: EF 35-40%, Gr 1 DD.  Marland Kitchen Vertigo   . Depression   . Non-obstructive CAD     a. 07/2014 Abnl MV;  b. 08/2014 Cath: LM nl, LAD 30p, RI 40p, LCX nl, OM1 40, RCA dominant 30p, 70d-->Med Rx.  . Left-sided Bell's palsy   . Poor balance   . Fall 11-10-14   Past Surgical History  Procedure Laterality Date  . Ruptured disc      1962 and 1998  . Cervical fusion    . Knee surgery      left knee 1991 and 1992; right knee 1995  . Prostate surgery      cancer--1998  . Replacement total knee      2004  . Hand surgery      right 1993; left 2005  . Back surgery      2011  .  Pacemaker insertion      PPM-- St Jude 11/30/10 by [name]  . Catheter ablation  1991    for WPW  . Joint replacement Left 2013    knee  . Joint replacement Right 2004    knee  . Lumbar laminectomy/decompression microdiscectomy N/A 06/07/2014    Procedure: LUMBAR FOUR TO FIVE LUMBAR LAMINECTOMY/DECOMPRESSION MICRODISCECTOMY 1 LEVEL;  Surgeon: [name], MD;  Location: [facility];  Service: Neurosurgery;  Laterality: N/A;  . Insert / replace / remove pacemaker    . Cardiac catheterization  08/26/2014    Single vessel obstructive CAD  . Left heart catheterization with coronary angiogram N/A 08/26/2014    Procedure: LEFT HEART CATHETERIZATION WITH CORONARY ANGIOGRAM;  Surgeon: [name], MD;  Location: [facility] CATH LAB;  Service: Cardiovascular;  Laterality: N/A;  . Cataract extraction  07-31-11 and 09-18-11  . Hernia repair  1955  . Cholecystectomy  09-07-14  . Trigger finger release  01-24-15  . Carpal tunnel release  04-04-15    [facility]  . Wound debridement Left 06/07/2015  Procedure: DEBRIDEMENT WOUND;  Surgeon: Robert Bellow, MD;  Location: [facility] ORS;  Service: General;  Laterality: Left;  left upper back  . Application of wound vac Left 06/07/2015    Procedure: APPLICATION OF WOUND VAC;  Surgeon: Robert Bellow, MD;  Location: [facility] ORS;  Service: General;  Laterality: Left;  left upper back    Current Outpatient Prescriptions  Medication Sig Dispense Refill  . amLODipine (NORVASC) 5 MG tablet Take 1 tablet (5 mg total) by mouth daily. 90 tablet 3  . aspirin 81 MG tablet Take 81 mg by mouth daily.      . Cholecalciferol (VITAMIN D3) 1000 UNITS CAPS Take 1,000 Units by mouth daily.     . Colesevelam HCl (WELCHOL) 3.75 G PACK Take 3.75 g by mouth daily. Mix in [place] of water and drink.    . hydrochlorothiazide 25 MG tablet Take 1 tablet (25 mg total) by mouth daily. 90 tablet 3  . lisinopril (PRINIVIL,ZESTRIL) 10 MG tablet TAKE 1 TABLET DAILY 90 tablet 3  . metFORMIN (GLUCOPHAGE) 500 MG tablet  Take 1 tablet (500 mg total) by mouth 2 (two) times daily. 180 tablet 1  . metoprolol succinate (TOPROL-XL) 25 MG 24 hr tablet Take 25 mg by mouth every evening.    . Multiple Vitamin (MULTIVITAMIN) capsule Take 1 capsule by mouth daily.      Marland Kitchen omeprazole (PRILOSEC) 40 MG capsule Take 40 mg by mouth 2 (two) times daily.    . simvastatin (ZOCOR) 40 MG tablet Take 1 tablet (40 mg total) by mouth at bedtime. 90 tablet 3   No current facility-administered medications for this visit.    Physical Exam: Filed Vitals:   10/02/15 1002  BP: 138/70  Pulse: 66  Height: '5\' 9"'$  (1.753 m)  Weight: 189 lb 3.2 oz (85.821 kg)    GEN- The patient is well appearing, alert and oriented x 3 today.   Head- normocephalic, atraumatic Eyes-  Sclera clear, conjunctiva pink Ears- hearing intact Oropharynx- clear Lungs- Clear to ausculation bilaterally, normal work of breathing Chest- pacemaker pocket is well healed Heart- Regular rate and rhythm, no murmurs, rubs or gallops, PMI not laterally displaced GI- soft, NT, ND, + BS Extremities- no clubbing, cyanosis, or edema  Pacemaker interrogation- reviewed in detail today,  See PACEART report  Assessment and Plan:   Complete heart block  Normal pacemaker function  See Pace Art report  No changes today   Essential hypertension  Stable No change required today  Nonischemic CM Reduced EF with nonobstructive CAD Given NYHA Class I/II symptoms, I would not advise upgrade to CRT at this time.   He has an underlying rhythm today with an AV of 240 msec.  We could consider extending out his AVs upon return if he still has an underlying rhythm, though given that he is doing so well, I am reluctant to make changes today.   Given advanced age, would not recommend ICD upgrade. On ace inhibitor/ beta blocker Due for follow-up with Dr Erskine Speed Return in 1 year to see EP NP.  She will follow device as long as he is doing well and I will see when  needed  [name] MD, [facility] 10/02/2015 10:30 AM

## 2015-10-02 NOTE — Patient Instructions (Addendum)
Medication Instructions:  Your physician recommends that you continue on your current medications as directed. Please refer to the Current Medication list given to you today.  Labwork: None ordered  Testing/Procedures: None ordered  Follow-Up: Remote monitoring is used to monitor your Pacemaker of ICD from home. This monitoring reduces the number of office visits required to check your device to one time per year. It allows [place] to keep an eye on the functioning of your device to ensure it is working properly. You are scheduled for a device check from home on 01/01/2016. You may send your transmission at any time that day. If you have a wireless device, the transmission will be sent automatically. After your physician reviews your transmission, you will receive a postcard with your next transmission date.  Your physician wants you to follow-up in: 1 year with Chanetta Marshall, NP. You will receive a reminder letter in the mail two months in advance. If you don't receive a letter, please call our office to schedule the follow-up appointment.   Any Other Special Instructions Will Be Listed Below (If Applicable). We will cancel your appointment with Dr. [name] tomorrow and discuss when the next follow up, with him, should be.  We will call you with decision.   Thank you for choosing [facility]!!

## 2015-10-03 ENCOUNTER — Ambulatory Visit: Payer: Medicare Other | Admitting: Cardiovascular Disease

## 2015-10-03 DIAGNOSIS — D0461 Carcinoma in situ of skin of right upper limb, including shoulder: Secondary | ICD-10-CM | POA: Diagnosis not present

## 2015-10-03 DIAGNOSIS — C44622 Squamous cell carcinoma of skin of right upper limb, including shoulder: Secondary | ICD-10-CM | POA: Diagnosis not present

## 2015-10-05 ENCOUNTER — Encounter: Payer: Self-pay | Admitting: Family Medicine

## 2015-10-06 ENCOUNTER — Inpatient Hospital Stay: Payer: Medicare Other | Attending: Cardiothoracic Surgery | Admitting: Cardiothoracic Surgery

## 2015-10-06 ENCOUNTER — Encounter: Payer: Self-pay | Admitting: Cardiothoracic Surgery

## 2015-10-06 VITALS — BP 112/67 | HR 68 | Temp 97.6°F | Resp 18 | Ht 69.0 in | Wt 187.2 lb

## 2015-10-06 DIAGNOSIS — R918 Other nonspecific abnormal finding of lung field: Secondary | ICD-10-CM | POA: Insufficient documentation

## 2015-10-06 NOTE — Progress Notes (Signed)
Patient ID: Howard [name], male   DOB: 1930/12/27, 81 y.o.   MRN: [id number]  Chief Complaint  Patient presents with  . Lung Lesion    Referral- Dr. Natale Milch    Referred By Dr. Janae Bridgeman Reason for Referral right lung nodules  HPI Location, Quality, Duration, Severity, Timing, Context, Modifying Factors, Associated Signs and Symptoms.  Howard [name] is a 81 y.o. male.  I have personally seen and examined this patient. He is an 81 year old man whose problems really began several months ago when he had an infected sebaceous cyst on his left posterior chest wall. This will required inpatient excision and wound VAC placement. He developed some atypical chest discomfort and had a chest x-ray made which saw a suspicious lesion and a subsequent CT scan was performed revealing a right upper and right lower lobe nodules. The right lower lobe nodule had a calcified center most consistent with a granuloma although the right upper lobe nodule did not. The patient had a CT scan approximately one year ago and the nodules were identified then. The nodule in the right upper lobe has increased slightly in size. No PET scan has been performed yet. No pulmonary function studies have been performed yet. The patient states he's been pretty healthy for his age. He does have a history of Wolff-Parkinson-White syndrome in 1991 underwent catheter ablation followed by pacemaker insertion about 5 years ago. He's had multiple other smaller procedures performed including multiple hand operations. He's had a knee replacement lumbar laminectomy gallbladder surgery and the sebaceous cyst.  The patient does not complain of any significant shortness of breath. He's had no weight loss. He's had no hemoptysis. He does have a history of multiple skin cancers and sees a dermatologist for those.   Past Medical History  Diagnosis Date  . Hyperlipidemia   . Hypertension   . Complete heart block ([place])     a. 11/2010 s/p SJM 2210 [facility]  DC PPM, ser# [id number].  . WPW (Wolff-Parkinson-White syndrome)     a. S/P RFCA 1991.  . Sleep apnea     a. cpap  . Diabetes mellitus without complication ([place])   . LBBB (left bundle branch block)   . GERD (gastroesophageal reflux disease)   . Arthritis   . Cancer [facility])     prostate and skin  . NICM (nonischemic cardiomyopathy) ([place])     a. 07/2014 Echo: EF 35-40%, mid-apicalanteroseptal DK, Gr 1 DD, mild-mod dil LA.  Marland Kitchen Chronic combined systolic and diastolic CHF, NYHA class 1 ([place])     a. 07/2014 Echo: EF 35-40%, Gr 1 DD.  Marland Kitchen Vertigo   . Depression   . Non-obstructive CAD     a. 07/2014 Abnl MV;  b. 08/2014 Cath: LM nl, LAD 30p, RI 40p, LCX nl, OM1 40, RCA dominant 30p, 70d-->Med Rx.  . Left-sided Bell's palsy   . Poor balance   . Fall 11-10-14    Past Surgical History  Procedure Laterality Date  . Ruptured disc      1962 and 1998  . Cervical fusion    . Knee surgery      left knee 1991 and 1992; right knee 1995  . Prostate surgery      cancer--1998  . Replacement total knee      2004  . Hand surgery      right 1993; left 2005  . Back surgery      2011  . Pacemaker insertion      PPM-- [facility] 11/30/10 by [name]  . Catheter ablation  1991    for WPW  . Joint replacement Left 2013    knee  . Joint replacement Right 2004    knee  . Lumbar laminectomy/decompression microdiscectomy N/A 06/07/2014    Procedure: LUMBAR FOUR TO FIVE LUMBAR LAMINECTOMY/DECOMPRESSION MICRODISCECTOMY 1 LEVEL;  Surgeon: [name], MD;  Location: [facility]URO ORS;  Service: Neurosurgery;  Laterality: N/A;  . Insert / replace / remove pacemaker    . Cardiac catheterization  08/26/2014    Single vessel obstructive CAD  . Left heart catheterization with coronary angiogram N/A 08/26/2014    Procedure: LEFT HEART CATHETERIZATION WITH CORONARY ANGIOGRAM;  Surgeon: [name], MD;  Location: [facility] CATH LAB;  Service: Cardiovascular;  Laterality: N/A;  . Cataract extraction  07-31-11 and 09-18-11  . Hernia repair   1955  . Cholecystectomy  09-07-14  . Trigger finger release  01-24-15  . Carpal tunnel release  04-04-15    [facility]  . Wound debridement Left 06/07/2015    Procedure: DEBRIDEMENT WOUND;  Surgeon: Robert Bellow, MD;  Location: [facility] ORS;  Service: General;  Laterality: Left;  left upper back  . Application of wound vac Left 06/07/2015    Procedure: APPLICATION OF WOUND VAC;  Surgeon: Robert Bellow, MD;  Location: [facility] ORS;  Service: General;  Laterality: Left;  left upper back    Family History  Problem Relation Age of Onset  . CAD    . Heart attack Mother   . Hyperlipidemia Mother     Social History Social History  Substance Use Topics  . Smoking status: Former Smoker -- 4 years  . Smokeless tobacco: Never Used     Comment: Quit 2011  . Alcohol Use: 0.6 oz/week    0 Standard drinks or equivalent, 1 Cans of beer per week     Comment: occasional    Allergies  Allergen Reactions  . Sulfonamide Derivatives Rash    Current Outpatient Prescriptions  Medication Sig Dispense Refill  . amLODipine (NORVASC) 5 MG tablet Take 1 tablet (5 mg total) by mouth daily. 90 tablet 3  . aspirin 81 MG tablet Take 81 mg by mouth daily.      . Cholecalciferol (VITAMIN D3) 1000 UNITS CAPS Take 1,000 Units by mouth daily.     . Colesevelam HCl (WELCHOL) 3.75 G PACK Take 3.75 g by mouth daily. Mix in [place] of water and drink.    . hydrochlorothiazide 25 MG tablet Take 1 tablet (25 mg total) by mouth daily. 90 tablet 3  . lisinopril (PRINIVIL,ZESTRIL) 10 MG tablet TAKE 1 TABLET DAILY 90 tablet 3  . metFORMIN (GLUCOPHAGE) 500 MG tablet Take 1 tablet (500 mg total) by mouth 2 (two) times daily. 180 tablet 1  . metoprolol succinate (TOPROL-XL) 25 MG 24 hr tablet Take 25 mg by mouth every evening.    . Multiple Vitamin (MULTIVITAMIN) capsule Take 1 capsule by mouth daily.      Marland Kitchen omeprazole (PRILOSEC) 40 MG capsule Take 40 mg by mouth 2 (two) times daily.    . simvastatin (ZOCOR) 40 MG tablet Take 1 tablet  (40 mg total) by mouth at bedtime. 90 tablet 3   No current facility-administered medications for this visit.      Review of Systems A complete review of systems was asked and was negative except for the following positive findings hearing difficulty, joint pain, joint stiffness, skin cancers  Blood pressure 112/67, pulse 68, temperature 97.6 F (  36.4 C), temperature source Tympanic, resp. rate 18, height '5\' 9"'$  (1.753 m), weight 187 lb 2.7 oz (84.9 kg).  Physical Exam CONSTITUTIONAL:  Pleasant, well-developed, well-nourished, and in no acute distress. EYES: Pupils equal and reactive to light, Sclera non-icteric EARS, NOSE, MOUTH AND THROAT:  The oropharynx was clear.  Dentition is good repair.  Oral mucosa pink and moist. LYMPH NODES:  Lymph nodes in the neck and axillae were normal RESPIRATORY:  Lungs were clear.  Normal respiratory effort without pathologic use of accessory muscles of respiration CARDIOVASCULAR: Heart was regular without murmurs.  There were no carotid bruits. GI: The abdomen was soft, nontender, and nondistended. There were no palpable masses. There was no hepatosplenomegaly. There were normal bowel sounds in all quadrants. GU:  Rectal deferred.   MUSCULOSKELETAL:  Normal muscle strength and tone.  No clubbing or cyanosis.   SKIN:  There were no pathologic skin lesions.  There were no nodules on palpation. NEUROLOGIC:  Sensation is normal.  Cranial nerves are grossly intact. PSYCH:  Oriented to person, place and time.  Mood and affect are normal.  Data Reviewed CT scans  I have personally reviewed the patient's imaging, laboratory findings and medical records.    Assessment    I have personally examined the CT scans. There is a noncalcified nodule in the right upper lobe just adjacent to the fissure. This may have increased slightly over the last year.    Plan    I had a long discussion today with the patient regarding the options. Certainly he would be a  candidate for surgical resection. Alternatives discussed included repeat and continued surveillance, PET scan, percutaneous biopsy. The patient and his wife had all their questions answered. They would like to discuss this with her son and they will be back in touch. In the interim we will go ahead and set him up for a complete set of pulmonary function studies and a PET scan.       Nestor Lewandowsky, MD 10/06/2015, 1:33 PM

## 2015-10-10 ENCOUNTER — Other Ambulatory Visit: Payer: Self-pay | Admitting: Cardiothoracic Surgery

## 2015-10-10 DIAGNOSIS — R918 Other nonspecific abnormal finding of lung field: Secondary | ICD-10-CM

## 2015-10-12 ENCOUNTER — Ambulatory Visit: Payer: Medicare Other

## 2015-10-12 ENCOUNTER — Ambulatory Visit
Admission: RE | Admit: 2015-10-12 | Discharge: 2015-10-12 | Disposition: A | Discharge status: Home / Self Care | Payer: Medicare Other | Source: Ambulatory Visit | Attending: Cardiothoracic Surgery | Admitting: Cardiothoracic Surgery

## 2015-10-12 DIAGNOSIS — I700 Atherosclerosis of aorta: Secondary | ICD-10-CM | POA: Insufficient documentation

## 2015-10-12 DIAGNOSIS — Z79899 Other long term (current) drug therapy: Secondary | ICD-10-CM | POA: Insufficient documentation

## 2015-10-12 DIAGNOSIS — R918 Other nonspecific abnormal finding of lung field: Secondary | ICD-10-CM | POA: Diagnosis not present

## 2015-10-12 DIAGNOSIS — I77811 Abdominal aortic ectasia: Secondary | ICD-10-CM | POA: Insufficient documentation

## 2015-10-12 LAB — GLUCOSE, CAPILLARY: Glucose-Capillary: 116 mg/dL — ABNORMAL HIGH (ref 65–99)

## 2015-10-12 MED ORDER — FLUDEOXYGLUCOSE F - 18 (FDG) INJECTION
12.9200 | Freq: Once | INTRAVENOUS | Status: DC | PRN
  Administered 2015-10-12: 12.92 via INTRAVENOUS
  Filled 2015-10-12: qty 12.92

## 2015-10-19 ENCOUNTER — Inpatient Hospital Stay (HOSPITAL_BASED_OUTPATIENT_CLINIC_OR_DEPARTMENT_OTHER): Payer: Medicare Other | Admitting: Cardiothoracic Surgery

## 2015-10-19 VITALS — BP 158/80 | HR 70 | Temp 95.9°F | Wt 190.0 lb

## 2015-10-19 DIAGNOSIS — J984 Other disorders of lung: Secondary | ICD-10-CM

## 2015-10-19 DIAGNOSIS — R918 Other nonspecific abnormal finding of lung field: Secondary | ICD-10-CM | POA: Diagnosis not present

## 2015-10-19 NOTE — Progress Notes (Signed)
Howard [name] Inpatient Post-Op Note  Patient ID: Howard [name], male   DOB: 1931/07/24, 81 y.o.   MRN: [id number]  HISTORY: Mr. Elkhatib returns today. He did undergo pulmonary function testing which was overall pretty good. He is accompanied today by his son. We again reviewed the results of the PET scan and pulmonary functions. The PET scan revealed some very low-level activity in the right upper lobe lesion. The patient has had no new symptoms. He states he feels well overall. He is not short of breath.   Filed Vitals:   10/19/15 1110  BP: 158/80  Pulse: 70  Temp: 95.9 F (35.5 C)     EXAM: Resp: Lungs are clear bilaterally.  No respiratory distress, normal effort. Heart:  Regular without murmurs Psychiatric: Normal mood and affect. Normal behavior. Judgment and thought content normal.    ASSESSMENT: I reviewed in detail with the patient and his son the results of the CT scan and PET scan. There is a irregular mass in the right upper lobe which has the morphology consistent with a malignancy. After extensive discussion with the patient regarding the indications and risks of all the various options he has elected to pursue a CT-guided needle biopsy. We will arrange for that.   PLAN:   CT-guided needle biopsy right upper lobe. Discussed with Dr. Aletta Edouard in interventional radiology    Nestor Lewandowsky, MD

## 2015-10-23 DIAGNOSIS — M79671 Pain in right foot: Secondary | ICD-10-CM | POA: Diagnosis not present

## 2015-10-23 DIAGNOSIS — M216X1 Other acquired deformities of right foot: Secondary | ICD-10-CM | POA: Diagnosis not present

## 2015-10-23 DIAGNOSIS — D2371 Other benign neoplasm of skin of right lower limb, including hip: Secondary | ICD-10-CM | POA: Diagnosis not present

## 2015-10-25 ENCOUNTER — Other Ambulatory Visit: Payer: Self-pay | Admitting: Radiology

## 2015-10-26 ENCOUNTER — Ambulatory Visit
Admission: RE | Admit: 2015-10-26 | Discharge: 2015-10-26 | Disposition: A | Discharge status: Home / Self Care | Payer: Medicare Other | Source: Ambulatory Visit | Attending: Interventional Radiology | Admitting: Interventional Radiology

## 2015-10-26 ENCOUNTER — Ambulatory Visit
Admission: RE | Admit: 2015-10-26 | Discharge: 2015-10-26 | Disposition: A | Discharge status: Home / Self Care | Payer: Medicare Other | Source: Ambulatory Visit | Attending: Cardiothoracic Surgery | Admitting: Cardiothoracic Surgery

## 2015-10-26 DIAGNOSIS — K219 Gastro-esophageal reflux disease without esophagitis: Secondary | ICD-10-CM | POA: Diagnosis not present

## 2015-10-26 DIAGNOSIS — E785 Hyperlipidemia, unspecified: Secondary | ICD-10-CM | POA: Diagnosis not present

## 2015-10-26 DIAGNOSIS — I2510 Atherosclerotic heart disease of native coronary artery without angina pectoris: Secondary | ICD-10-CM | POA: Diagnosis not present

## 2015-10-26 DIAGNOSIS — Z8546 Personal history of malignant neoplasm of prostate: Secondary | ICD-10-CM | POA: Insufficient documentation

## 2015-10-26 DIAGNOSIS — G4730 Sleep apnea, unspecified: Secondary | ICD-10-CM | POA: Diagnosis not present

## 2015-10-26 DIAGNOSIS — J95811 Postprocedural pneumothorax: Secondary | ICD-10-CM

## 2015-10-26 DIAGNOSIS — G510 Bell's palsy: Secondary | ICD-10-CM | POA: Insufficient documentation

## 2015-10-26 DIAGNOSIS — E119 Type 2 diabetes mellitus without complications: Secondary | ICD-10-CM | POA: Insufficient documentation

## 2015-10-26 DIAGNOSIS — I10 Essential (primary) hypertension: Secondary | ICD-10-CM | POA: Insufficient documentation

## 2015-10-26 DIAGNOSIS — Z9889 Other specified postprocedural states: Secondary | ICD-10-CM | POA: Insufficient documentation

## 2015-10-26 DIAGNOSIS — C3411 Malignant neoplasm of upper lobe, right bronchus or lung: Secondary | ICD-10-CM | POA: Diagnosis not present

## 2015-10-26 DIAGNOSIS — I5042 Chronic combined systolic (congestive) and diastolic (congestive) heart failure: Secondary | ICD-10-CM | POA: Insufficient documentation

## 2015-10-26 DIAGNOSIS — Z87891 Personal history of nicotine dependence: Secondary | ICD-10-CM | POA: Insufficient documentation

## 2015-10-26 DIAGNOSIS — Z85828 Personal history of other malignant neoplasm of skin: Secondary | ICD-10-CM | POA: Insufficient documentation

## 2015-10-26 DIAGNOSIS — Z8249 Family history of ischemic heart disease and other diseases of the circulatory system: Secondary | ICD-10-CM | POA: Insufficient documentation

## 2015-10-26 DIAGNOSIS — M1990 Unspecified osteoarthritis, unspecified site: Secondary | ICD-10-CM | POA: Insufficient documentation

## 2015-10-26 DIAGNOSIS — Z9181 History of falling: Secondary | ICD-10-CM | POA: Diagnosis not present

## 2015-10-26 DIAGNOSIS — R918 Other nonspecific abnormal finding of lung field: Secondary | ICD-10-CM | POA: Diagnosis present

## 2015-10-26 DIAGNOSIS — R911 Solitary pulmonary nodule: Secondary | ICD-10-CM | POA: Diagnosis not present

## 2015-10-26 DIAGNOSIS — J984 Other disorders of lung: Secondary | ICD-10-CM

## 2015-10-26 HISTORY — DX: Presence of cardiac pacemaker: Z95.0

## 2015-10-26 HISTORY — DX: Bell's palsy: G51.0

## 2015-10-26 LAB — CBC
HCT: 41.2 % (ref 40.0–52.0)
HEMOGLOBIN: 13.3 g/dL (ref 13.0–18.0)
MCH: 24.4 pg — AB (ref 26.0–34.0)
MCHC: 32.2 g/dL (ref 32.0–36.0)
MCV: 75.8 fL — AB (ref 80.0–100.0)
Platelets: 189 10*3/uL (ref 150–440)
RBC: 5.43 MIL/uL (ref 4.40–5.90)
RDW: 15.9 % — ABNORMAL HIGH (ref 11.5–14.5)
WBC: 5.1 10*3/uL (ref 3.8–10.6)

## 2015-10-26 LAB — APTT: aPTT: 40 seconds — ABNORMAL HIGH (ref 24–36)

## 2015-10-26 LAB — PROTIME-INR
INR: 0.94
PROTHROMBIN TIME: 12.8 s (ref 11.4–15.0)

## 2015-10-26 MED ORDER — SODIUM CHLORIDE 0.9 % IV SOLN
Freq: Once | INTRAVENOUS | Status: AC
  Administered 2015-10-26: 09:00:00 via INTRAVENOUS

## 2015-10-26 MED ORDER — FENTANYL CITRATE (PF) 100 MCG/2ML IJ SOLN
INTRAMUSCULAR | Status: AC | PRN
  Administered 2015-10-26: 50 ug via INTRAVENOUS
  Administered 2015-10-26: 25 ug via INTRAVENOUS

## 2015-10-26 MED ORDER — MIDAZOLAM HCL 5 MG/5ML IJ SOLN
INTRAMUSCULAR | Status: AC | PRN
  Administered 2015-10-26: 0.5 mg via INTRAVENOUS
  Administered 2015-10-26: 1 mg via INTRAVENOUS
  Administered 2015-10-26: 0.5 mg via INTRAVENOUS

## 2015-10-26 NOTE — H&P (Signed)
Chief Complaint: Lung nodule  Referring Physician(s): Oaks,Timothy  History of Present Illness: Howard [name] is a 81 y.o. male presenting today for a right lung nodule biopsy, which was discovered on PET-CT to have FDG activity.   .     Past Medical History  Diagnosis Date  . Hyperlipidemia   . Hypertension   . Complete heart block ([place])     a. 11/2010 s/p SJM 2210 [facility] DC PPM, ser# [id number].  . WPW (Wolff-Parkinson-White syndrome)     a. S/P RFCA 1991.  . Sleep apnea     a. cpap  . Diabetes mellitus without complication ([place])   . LBBB (left bundle branch block)   . GERD (gastroesophageal reflux disease)   . Arthritis   . Cancer [facility])     prostate and skin  . NICM (nonischemic cardiomyopathy) ([place])     a. 07/2014 Echo: EF 35-40%, mid-apicalanteroseptal DK, Gr 1 DD, mild-mod dil LA.  Marland Kitchen Chronic combined systolic and diastolic CHF, NYHA class 1 ([place])     a. 07/2014 Echo: EF 35-40%, Gr 1 DD.  Marland Kitchen Vertigo   . Depression   . Non-obstructive CAD     a. 07/2014 Abnl MV;  b. 08/2014 Cath: LM nl, LAD 30p, RI 40p, LCX nl, OM1 40, RCA dominant 30p, 70d-->Med Rx.  . Left-sided Bell's palsy   . Poor balance   . Fall 11-10-14  . Presence of permanent cardiac pacemaker   . Bell palsy     Past Surgical History  Procedure Laterality Date  . Ruptured disc      1962 and 1998  . Cervical fusion    . Knee surgery      left knee 1991 and 1992; right knee 1995  . Prostate surgery      cancer--1998  . Replacement total knee      2004  . Hand surgery      right 1993; left 2005  . Back surgery      2011  . Pacemaker insertion      PPM-- [facility] 11/30/10 by [name]  . Catheter ablation  1991    for WPW  . Joint replacement Left 2013    knee  . Joint replacement Right 2004    knee  . Lumbar laminectomy/decompression microdiscectomy N/A 06/07/2014    Procedure: LUMBAR FOUR TO FIVE LUMBAR LAMINECTOMY/DECOMPRESSION MICRODISCECTOMY 1 LEVEL;  Surgeon: [name], MD;  Location: [facility]  NEURO ORS;  Service: Neurosurgery;  Laterality: N/A;  . Insert / replace / remove pacemaker    . Cardiac catheterization  08/26/2014    Single vessel obstructive CAD  . Left heart catheterization with coronary angiogram N/A 08/26/2014    Procedure: LEFT HEART CATHETERIZATION WITH CORONARY ANGIOGRAM;  Surgeon: [name], MD;  Location: [facility] CATH LAB;  Service: Cardiovascular;  Laterality: N/A;  . Cataract extraction  07-31-11 and 09-18-11  . Hernia repair  1955  . Cholecystectomy  09-07-14  . Trigger finger release  01-24-15  . Carpal tunnel release  04-04-15    [facility]  . Wound debridement Left 06/07/2015    Procedure: DEBRIDEMENT WOUND;  Surgeon: Robert Bellow, MD;  Location: [facility] ORS;  Service: General;  Laterality: Left;  left upper back  . Application of wound vac Left 06/07/2015    Procedure: APPLICATION OF WOUND VAC;  Surgeon: Robert Bellow, MD;  Location: [facility] ORS;  Service: General;  Laterality: Left;  left upper back    Allergies: Sulfonamide derivatives  Medications: Prior to Admission medications   Medication Sig Start Date End Date Taking? Authorizing Provider  amLODipine (NORVASC) 5 MG tablet Take 1 tablet (5 mg total) by mouth daily. 03/21/11  Yes [name], MD  aspirin 81 MG tablet Take 81 mg by mouth daily.     Yes Historical Provider, MD  Cholecalciferol (VITAMIN D3) 1000 UNITS CAPS Take 1,000 Units by mouth daily.    Yes Historical Provider, MD  Colesevelam HCl [facility]) 3.75 G PACK Take 3.75 g by mouth daily. Mix in [place] of water and drink.   Yes Historical Provider, MD  hydrochlorothiazide 25 MG tablet Take 1 tablet (25 mg total) by mouth daily. 03/21/11  Yes [name], MD  lisinopril (PRINIVIL,ZESTRIL) 10 MG tablet TAKE 1 TABLET DAILY 07/07/15  Yes [name], MD  metFORMIN (GLUCOPHAGE) 500 MG tablet Take 1 tablet (500 mg total) by mouth 2 (two) times daily. 08/22/15  Yes Dennis E Chrismon, PA  metoprolol succinate (TOPROL-XL) 25 MG 24 hr tablet Take 25 mg by  mouth every evening.   Yes Historical Provider, MD  Multiple Vitamin (MULTIVITAMIN) capsule Take 1 capsule by mouth daily.     Yes Historical Provider, MD  omeprazole (PRILOSEC) 40 MG capsule Take 40 mg by mouth 2 (two) times daily.   Yes Historical Provider, MD  simvastatin (ZOCOR) 40 MG tablet Take 1 tablet (40 mg total) by mouth at bedtime. 10/25/14  Yes [name], MD     Family History  Problem Relation Age of Onset  . CAD    . Heart attack Mother   . Hyperlipidemia Mother     Social History   Social History  . Marital Status: Married    Spouse Name: N/A  . Number of Children: 2  . Years of Education: College   Occupational History  . Retired    Social History Main Topics  . Smoking status: Former Smoker -- 4 years  . Smokeless tobacco: Never Used     Comment: Quit 2011  . Alcohol Use: 0.6 oz/week    1 Cans of beer, 0 Standard drinks or equivalent per week     Comment: occasional  . Drug Use: No  . Sexual Activity: Not Asked   Other Topics Concern  . None   Social History Narrative   Drinks 2 cups of coffee a day        Review of Systems: A 12 point ROS discussed and pertinent positives are indicated in the HPI above.  All other systems are negative.  Review of Systems  Vital Signs: BP 149/70 mmHg  Pulse 60  Temp(Src) 98.2 F (36.8 C) (Oral)  Resp 18  Ht '5\' 9"'$  (1.753 m)  Wt 190 lb (86.183 kg)  BMI 28.05 kg/m2  SpO2 94%  Physical Exam   Atraumatic, normocephalic Glasses.  Conjugate gaze.  No icterus or scleral injection CTA bilateral.  No labored breathing.  RRR.   BS +.  No tenderness GU deferred.  Mallampati Score: 2    Imaging: Ct Chest Wo Contrast  09/28/2015  ADDENDUM REPORT: 09/28/2015 08:44 ADDENDUM: Study discussed by telephone with Dr. [name] on 09/28/2015 at 0830 hours. Electronically Signed   By: Genevie [name] M.D.   On: 09/28/2015 08:44  09/28/2015  CLINICAL DATA:  81 year old male. Follow up right lung nodule and recent  pneumonia. Subsequent encounter. EXAM: CT CHEST WITHOUT CONTRAST TECHNIQUE: Multidetector CT imaging of the chest was performed following the standard protocol without IV contrast. COMPARISON:  Chest radiographs 09/18/2015 and earlier. Chest CT 07/29/2014. FINDINGS: 2.4 cm medial aspect right lower lobe superior segment lung nodule is stable since 2015 with coarse internal calcification. There is a similar appearing coarsely calcified right cardiophrenic angle lesion. Suspect these in addition to several small calcified granulomas in the lungs are the sequelae of previous granulomatous disease. However, the spiculated nodule in the anterior right upper lobe (series 4, image 37 today) appears slightly larger than in 2015. Spiculations extend up to 13-15 mm. Compare coronal image 34 today tip coronal image 26 previously. Scarring/thickening along the left major fissure in the lingula with mild bronchiectasis in the region is stable. The left hemidiaphragm is slightly more elevated today. No pleural effusion. No acute pulmonary opacity. Major airways are patent. No pericardial effusion. Extensive calcified coronary artery and aortic atherosclerosis. Left chest cardiac pacemaker. Small mediastinal and hilar lymph nodes are stable and within normal limits. No axillary lymphadenopathy. Negative non contrast thoracic inlet. Stable and negative for age visualized noncontrast upper abdominal viscera, including punctate calcified granuloma in the spleen. Degenerative changes in the spine. Right posterior thorax subcutaneous sebaceous cyst (series 2, image 23). No acute or suspicious osseous lesion. IMPRESSION: 1. Suspicious anterior right upper lobe spiculated nodule abutting the minor fissure seems slightly larger than in August 2015. At this point, recommend referral to thoracic surgery for discussion of further imaging surveillance versus histologic sampling options. 2. Other lung and mediastinal findings are stable and  compatible with prior granulomatous disease. 3. Widespread aortic and coronary artery calcified atherosclerosis. Electronically Signed: By: Genevie [name] M.D. On: 09/27/2015 13:05   Nm Pet Image Initial (pi) Skull Base To Thigh  10/12/2015  CLINICAL DATA:  Initial treatment strategy for pulmonary nodules. EXAM: NUCLEAR MEDICINE PET SKULL BASE TO THIGH TECHNIQUE: 12.9 mCi F-18 FDG was injected intravenously. Full-ring PET imaging was performed from the skull base to thigh after the radiotracer. CT data was obtained and used for attenuation correction and anatomic localization. FASTING BLOOD GLUCOSE:  Value: 116 mg/dl COMPARISON:  09/27/2015 FINDINGS: NECK No hypermetabolic lymph nodes in the neck. CHEST No hypermetabolic mediastinal or hilar nodes. Anterior right upper lobe nodule is again noted. On today's study this measures 1.3 cm and has an SUV max equal to 1.3. Centrally calcified nodule in the posterior medial right lower lobe measures 2.2 cm and has an SUV max equal to 2.54. This is unchanged in size when compared with 09/26/14. No significant uptake associated with the calcified nodule within the anterior right lung base. ABDOMEN/PELVIS No abnormal hypermetabolic activity within the liver, pancreas, adrenal glands, or spleen. Calcified and ectatic abdominal aorta measures 2.9 cm. Previous prostatectomy and pelvic lymph node dissection No hypermetabolic lymph nodes in the abdomen or pelvis. SKELETON No focal hypermetabolic activity to suggest skeletal metastasis. IMPRESSION: 1. There is low level FDG uptake associated with the anterior basal right upper lobe spiculated nodule. The SUV max is equal to 1.3 cm. Morphologically the appearance of this nodule is suspicious. Given the non malignant range FDG uptake associated with this nodule, careful serial followup imaging for 24 months is recommended to establish size stability. The next followup exam should be obtained in 3 months. If there is interval growth of  this nodule then percutaneous biopsy or surgical resection would be recommended. 2. Stable appearance of calcified nodules in the right lower lobe and anterior right lung base. 3. Aortic atherosclerosis. 4. Ectasia of the abdominal aorta. Ectatic abdominal aorta at risk for aneurysm development. Recommend follow up  by [place] in 5 years. This recommendation follows ACR consensus guidelines: White Paper of the ACR Incidental Findings Committee II on Vascular Findings. J Am Coll Radiol 2013; 10:789-794. Electronically Signed   By: Kerby Moors M.D.   On: 10/12/2015 11:40    Labs:  CBC:  Recent Labs  11/10/14 2041 06/07/15 1036 06/16/15 1141 09/18/15 0927  WBC 5.8 5.6 9.2 5.4  HGB 14.1 12.5*  --   --   HCT 43.6 38.1* 39.1 41.7  PLT 206 301  --   --     COAGS: No results for input(s): INR, APTT in the last 8760 hours.  BMP:  Recent Labs  11/10/14 2041 04/26/15 1137 09/18/15 0927  NA 137 134 136  K 3.9 4.4 4.3  CL 104 94* 97  CO2 '24 24 23  '$ GLUCOSE 154* 202* 120*  BUN '13 12 11  '$ CALCIUM 8.5 9.5 9.2  CREATININE 1.01 0.71* 0.76  GFRNONAA >60 87 85  GFRAA >60 101 98    LIVER FUNCTION TESTS:  Recent Labs  04/26/15 1137 09/18/15 0927  BILITOT 0.5 0.3  AST 16 21  ALT 17 18  ALKPHOS 74 57  PROT 6.7 6.7  ALBUMIN 4.2 4.2    TUMOR MARKERS: No results for input(s): AFPTM, CEA, CA199, CHROMGRNA in the last 8760 hours.  Assessment and Plan:  81 year old male presenting for right lung biopsy.  I have discussed the procedure in detail with the patient has his wife.    Risks and Benefits discussed with the patient including, but not limited to bleeding, hemoptysis, respiratory failure requiring intubation, infection, pneumothorax requiring chest tube placement, stroke from air embolism or even death. All of the patient's questions were answered, patient is agreeable to proceed. Consent signed and in chart.    Thank you for this interesting consult.  I greatly enjoyed meeting  Howard [name] and look forward to participating in their care.  A copy of this report was sent to the requesting provider on this date.  SignedCorrie Mckusick 10/26/2015, 8:38 AM

## 2015-10-26 NOTE — Procedures (Signed)
Interventional Radiology Procedure Note  Procedure: CT guided bx of right upper lobe nodule, 68m.  3 x 18G core.     Complications: Small pneumothorax.  SIR category 1.  No treatment necessary.  Recommendations:  - Recover in right decubitus.  - CXR in 1 & 2 hours - continue NPO until CXR's complete. - Routine wound care  Signed,  JDulcy Fanny WEarleen Newport DO

## 2015-10-27 ENCOUNTER — Other Ambulatory Visit: Payer: Self-pay | Admitting: Family Medicine

## 2015-10-27 LAB — SURGICAL PATHOLOGY

## 2015-11-02 DIAGNOSIS — G4733 Obstructive sleep apnea (adult) (pediatric): Secondary | ICD-10-CM | POA: Diagnosis not present

## 2015-11-07 ENCOUNTER — Other Ambulatory Visit: Payer: Self-pay | Admitting: Cardiovascular Disease

## 2015-11-09 ENCOUNTER — Ambulatory Visit
Admission: RE | Admit: 2015-11-09 | Discharge: 2015-11-09 | Disposition: A | Discharge status: Home / Self Care | Payer: Medicare Other | Source: Ambulatory Visit | Attending: Radiation Oncology | Admitting: Radiation Oncology

## 2015-11-09 ENCOUNTER — Encounter: Payer: Self-pay | Admitting: Radiation Oncology

## 2015-11-09 ENCOUNTER — Inpatient Hospital Stay: Payer: Medicare Other | Attending: Cardiothoracic Surgery | Admitting: Cardiothoracic Surgery

## 2015-11-09 VITALS — BP 148/72 | HR 60 | Temp 97.1°F | Resp 18 | Ht 69.0 in | Wt 187.8 lb

## 2015-11-09 DIAGNOSIS — Z510 Encounter for antineoplastic radiation therapy: Secondary | ICD-10-CM | POA: Insufficient documentation

## 2015-11-09 DIAGNOSIS — C3490 Malignant neoplasm of unspecified part of unspecified bronchus or lung: Secondary | ICD-10-CM | POA: Insufficient documentation

## 2015-11-09 DIAGNOSIS — Z87891 Personal history of nicotine dependence: Secondary | ICD-10-CM | POA: Insufficient documentation

## 2015-11-09 DIAGNOSIS — C3411 Malignant neoplasm of upper lobe, right bronchus or lung: Secondary | ICD-10-CM | POA: Diagnosis not present

## 2015-11-09 NOTE — Consult Note (Signed)
Except an outstanding is perfect of Radiation Oncology NEW PATIENT EVALUATION  Name: Howard [name]  MRN: [id number]  Date:   11/09/2015     DOB: February 19, 1931   This 81 y.o. male patient presents to the clinic for initial evaluation of stage I adenocarcinoma of the right lung.  REFERRING PHYSICIAN: Chrismon, [name], PA  CHIEF COMPLAINT: No chief complaint on file.   DIAGNOSIS: The encounter diagnosis was Malignant neoplasm of upper lobe of right lung ([place]).   PREVIOUS INVESTIGATIONS:  PET CT CT scans reviewed Surgical pathology report reviewed Clinical notes reviewed  HPI: Patient is a 81 year old male who was seen for surgery for an infected sebaceous cyst of his left posterior chest wall which required inpatient excision and wound VAC placement. During hospitalization he had chest discomfort chest x-ray identified a suspicious lesion and CT scan confirmed a right upper and right lower lobe nodules. Right lower lobe nodule hepatic calcified center consistent with granuloma. Right upper lobe has increased slightly in size of her prior CT scan 1 year prior. PET CT scan was performed showing mild hypermetabolic activity in the right upper lobe and CT-guided biopsy was performed showing adenocarcinoma with acinar pattern. Patient's had a discussion with surgical oncology and has declined surgical intervention. He is seen today for radiation oncology opinion. He is doing well. He has no cough hemoptysis or chest tightness. No significant shortness of breath or dyspnea on exertion. P when take is good and weight is stable.  PLANNED TREATMENT REGIMEN: SB RT  PAST MEDICAL HISTORY:  has a past medical history of Hyperlipidemia; Hypertension; Complete heart block (HCC); WPW (Wolff-Parkinson-White syndrome); Sleep apnea; Diabetes mellitus without complication (HCC); LBBB (left bundle branch block); GERD (gastroesophageal reflux disease); Arthritis; Cancer [facility]); NICM (nonischemic cardiomyopathy) ([place]);  Chronic combined systolic and diastolic CHF, NYHA class 1 ([place]); Vertigo; Depression; Non-obstructive CAD; Left-sided Bell's palsy; Poor balance; Fall (11-10-14); Presence of permanent cardiac pacemaker; and Bell palsy.    PAST SURGICAL HISTORY:  Past Surgical History  Procedure Laterality Date  . Ruptured disc      1962 and 1998  . Cervical fusion    . Knee surgery      left knee 1991 and 1992; right knee 1995  . Prostate surgery      cancer--1998  . Replacement total knee      2004  . Hand surgery      right 1993; left 2005  . Back surgery      2011  . Pacemaker insertion      PPM-- St Jude 11/30/10 by [name]  . Catheter ablation  1991    for WPW  . Joint replacement Left 2013    knee  . Joint replacement Right 2004    knee  . Lumbar laminectomy/decompression microdiscectomy N/A 06/07/2014    Procedure: LUMBAR FOUR TO FIVE LUMBAR LAMINECTOMY/DECOMPRESSION MICRODISCECTOMY 1 LEVEL;  Surgeon: [name], MD;  Location: [facility];  Service: Neurosurgery;  Laterality: N/A;  . Insert / replace / remove pacemaker    . Cardiac catheterization  08/26/2014    Single vessel obstructive CAD  . Left heart catheterization with coronary angiogram N/A 08/26/2014    Procedure: LEFT HEART CATHETERIZATION WITH CORONARY ANGIOGRAM;  Surgeon: [name], MD;  Location[facility] CATH LAB;  Service: Cardiovascular;  Laterality: N/A;  . Cataract extraction  07-31-11 and 09-18-11  . Hernia repair  1955  . Cholecystectomy  09-07-14  . Trigger finger release  01-24-15  . Carpal tunnel  release  04-04-15    [facility]  . Wound debridement Left 06/07/2015    Procedure: DEBRIDEMENT WOUND;  Surgeon: Robert Bellow, MD;  Location: [facility] ORS;  Service: General;  Laterality: Left;  left upper back  . Application of wound vac Left 06/07/2015    Procedure: APPLICATION OF WOUND VAC;  Surgeon: Robert Bellow, MD;  Location: [facility] ORS;  Service: General;  Laterality: Left;  left upper back    FAMILY HISTORY: family history  includes CAD in an other family member; Heart attack in his mother; Hyperlipidemia in his mother.  SOCIAL HISTORY:  reports that he has quit smoking. He has never used smokeless tobacco. He reports that he drinks about 0.6 oz of alcohol per week. He reports that he does not use illicit drugs.  ALLERGIES: Sulfonamide derivatives  MEDICATIONS:  Current Outpatient Prescriptions  Medication Sig Dispense Refill  . amLODipine (NORVASC) 5 MG tablet Take 1 tablet (5 mg total) by mouth daily. 90 tablet 3  . aspirin 81 MG tablet Take 81 mg by mouth daily.      . Cholecalciferol (VITAMIN D3) 1000 UNITS CAPS Take 1,000 Units by mouth daily.     . Colesevelam HCl (WELCHOL) 3.75 G PACK Take 3.75 g by mouth daily. Mix in [place] of water and drink.    . hydrochlorothiazide 25 MG tablet Take 1 tablet (25 mg total) by mouth daily. 90 tablet 3  . lisinopril (PRINIVIL,ZESTRIL) 10 MG tablet TAKE 1 TABLET DAILY 90 tablet 3  . metFORMIN (GLUCOPHAGE) 500 MG tablet Take 1 tablet (500 mg total) by mouth 2 (two) times daily. 180 tablet 1  . metoprolol succinate (TOPROL-XL) 25 MG 24 hr tablet Take 25 mg by mouth every evening.    . Multiple Vitamin (MULTIVITAMIN) capsule Take 1 capsule by mouth daily.      Marland Kitchen omeprazole (PRILOSEC) 40 MG capsule Take 40 mg by mouth 2 (two) times daily.    . simvastatin (ZOCOR) 40 MG tablet Take 1 tablet (40 mg total) by mouth at bedtime. 90 tablet 3  . simvastatin (ZOCOR) 40 MG tablet TAKE 1 TABLET AT BEDTIME 90 tablet 3   No current facility-administered medications for this encounter.    ECOG PERFORMANCE STATUS:  0 - Asymptomatic  REVIEW OF SYSTEMS:  Patient denies any weight loss, fatigue, weakness, fever, chills or night sweats. Patient denies any loss of vision, blurred vision. Patient denies any ringing  of the ears or hearing loss. No irregular heartbeat. Patient denies heart murmur or history of fainting. Patient denies any chest pain or pain radiating to her upper extremities.  Patient denies any shortness of breath, difficulty breathing at night, cough or hemoptysis. Patient denies any swelling in the lower legs. Patient denies any nausea vomiting, vomiting of blood, or coffee ground material in the vomitus. Patient denies any stomach pain. Patient states has had normal bowel movements no significant constipation or diarrhea. Patient denies any dysuria, hematuria or significant nocturia. Patient denies any problems walking, swelling in the joints or loss of balance. Patient denies any skin changes, loss of hair or loss of weight. Patient denies any excessive worrying or anxiety or significant depression. Patient denies any problems with insomnia. Patient denies excessive thirst, polyuria, polydipsia. Patient denies any swollen glands, patient denies easy bruising or easy bleeding. Patient denies any recent infections, allergies or URI. Patient "s visual fields have not changed significantly in recent time.    PHYSICAL EXAM: There were no vitals taken for this visit. Well-developed  well-nourished patient in NAD. HEENT reveals PERLA, EOMI, discs not visualized.  Oral cavity is clear. No oral mucosal lesions are identified. Neck is clear without evidence of cervical or supraclavicular adenopathy. Lungs are clear to A&P. Cardiac examination is essentially unremarkable with regular rate and rhythm without murmur rub or thrill. Abdomen is benign with no organomegaly or masses noted. Motor sensory and DTR levels are equal and symmetric in the upper and lower extremities. Cranial nerves II through XII are grossly intact. Proprioception is intact. No peripheral adenopathy or edema is identified. No motor or sensory levels are noted. Crude visual fields are within normal range.  LABORATORY DATA: Surgical pathology report is reviewed    RADIOLOGY RESULTS: CT scans and PET/CT scan reviewed   IMPRESSION: Stage I adenocarcinoma the right upper lobe in 81 year old male.  PLAN: At this  time I have discussed SB RT radiation therapy to his right upper lobe adenocarcinoma. It is in close proximity to the chest wall and if we cannot deliver radiation therapy with sophisticated treatment planning to deliver CW V30 was less than 30 mL that we might have to use I MRT radiation therapy. My SB RT dose would be 5000 cGy in 5 fractions. That has shown very little evidence of chest wall problems with this dose of radiation in the SB RT literature. Risks and benefits of treatment including development of slight cough, fatigue, alteration of blood counts all were discussed in detail with the patient and his family. I have set up and ordered CT simulation. We will use MIP projections.. Patient family seem to comprehend my treatment plan well.  I would like to take this opportunity for allowing me to participate in the care of your patient.Armstead Peaks., MD

## 2015-11-09 NOTE — Progress Notes (Signed)
Patient is here for follow-up of lung mass and CT Guided Bx results. Patient states that he has been feeling well and offers no complaints today.

## 2015-11-09 NOTE — Progress Notes (Signed)
Howard [name] Inpatient Post-Op Note  Patient ID: Howard [name], male   DOB: 1931/12/18, 81 y.o.   MRN: [id number]  HISTORY: Howard [name] returns today in follow-up. Howard [name] tolerated his lung biopsy well. Howard [name] does not complain of any shortness of breath. Howard [name] does not have any pain at the biopsy site.   Filed Vitals:   11/09/15 0841  BP: 148/72  Pulse: 60  Temp: 97.1 F (36.2 C)  Resp: 18     EXAM: Resp: Lungs are clear bilaterally.  No respiratory distress, normal effort. Heart:  Regular without murmurs Neurological: Alert and oriented to person, place, and time. Coordination normal.  Skin: Skin is warm and dry. No rash noted. No diaphoretic. No erythema. No pallor.  biopsy site is without erythema or drainage  Psychiatric: Normal mood and affect. Normal behavior. Judgment and thought content normal.    ASSESSMENT: I had a long discussion with Howard [name] and his family today regarding the options. Given the fact that this is now a biopsy proven lung cancer I believe that radiation therapy or surgery may be amenable for therapy. After extensive discussion with the patient Howard [name] would like proceed with radiation therapy. We will ask Dr. Donella Stade to consult regarding management.   PLAN:   We will make an appointment for the patient follow-up with Dr. Donella Stade. Howard [name] will come back to see me on an as-needed basis.    Nestor Lewandowsky, MD

## 2015-11-14 ENCOUNTER — Ambulatory Visit
Admission: RE | Admit: 2015-11-14 | Discharge: 2015-11-14 | Disposition: A | Discharge status: Home / Self Care | Payer: Medicare Other | Source: Ambulatory Visit | Attending: Radiation Oncology | Admitting: Radiation Oncology

## 2015-11-14 DIAGNOSIS — C3411 Malignant neoplasm of upper lobe, right bronchus or lung: Secondary | ICD-10-CM | POA: Diagnosis not present

## 2015-11-14 DIAGNOSIS — Z87891 Personal history of nicotine dependence: Secondary | ICD-10-CM | POA: Diagnosis not present

## 2015-11-14 DIAGNOSIS — Z510 Encounter for antineoplastic radiation therapy: Secondary | ICD-10-CM | POA: Diagnosis not present

## 2015-11-22 DIAGNOSIS — C3411 Malignant neoplasm of upper lobe, right bronchus or lung: Secondary | ICD-10-CM | POA: Diagnosis not present

## 2015-11-22 DIAGNOSIS — Z87891 Personal history of nicotine dependence: Secondary | ICD-10-CM | POA: Diagnosis not present

## 2015-11-22 DIAGNOSIS — Z510 Encounter for antineoplastic radiation therapy: Secondary | ICD-10-CM | POA: Diagnosis not present

## 2015-11-27 ENCOUNTER — Ambulatory Visit
Admission: RE | Admit: 2015-11-27 | Discharge: 2015-11-27 | Disposition: A | Discharge status: Home / Self Care | Payer: Medicare Other | Source: Ambulatory Visit | Attending: Radiation Oncology | Admitting: Radiation Oncology

## 2015-11-27 DIAGNOSIS — C3411 Malignant neoplasm of upper lobe, right bronchus or lung: Secondary | ICD-10-CM | POA: Diagnosis not present

## 2015-11-27 DIAGNOSIS — Z87891 Personal history of nicotine dependence: Secondary | ICD-10-CM | POA: Diagnosis not present

## 2015-11-27 DIAGNOSIS — Z510 Encounter for antineoplastic radiation therapy: Secondary | ICD-10-CM | POA: Diagnosis not present

## 2015-11-28 DIAGNOSIS — Z87891 Personal history of nicotine dependence: Secondary | ICD-10-CM | POA: Diagnosis not present

## 2015-11-28 DIAGNOSIS — Z510 Encounter for antineoplastic radiation therapy: Secondary | ICD-10-CM | POA: Diagnosis not present

## 2015-11-28 DIAGNOSIS — C3411 Malignant neoplasm of upper lobe, right bronchus or lung: Secondary | ICD-10-CM | POA: Diagnosis not present

## 2015-11-29 ENCOUNTER — Ambulatory Visit
Admission: RE | Admit: 2015-11-29 | Discharge: 2015-11-29 | Disposition: A | Discharge status: Home / Self Care | Payer: Medicare Other | Source: Ambulatory Visit | Attending: Radiation Oncology | Admitting: Radiation Oncology

## 2015-11-29 DIAGNOSIS — Z87891 Personal history of nicotine dependence: Secondary | ICD-10-CM | POA: Diagnosis not present

## 2015-11-29 DIAGNOSIS — C3411 Malignant neoplasm of upper lobe, right bronchus or lung: Secondary | ICD-10-CM | POA: Diagnosis not present

## 2015-11-29 DIAGNOSIS — Z510 Encounter for antineoplastic radiation therapy: Secondary | ICD-10-CM | POA: Diagnosis not present

## 2015-12-04 ENCOUNTER — Ambulatory Visit
Admission: RE | Admit: 2015-12-04 | Discharge: 2015-12-04 | Disposition: A | Discharge status: Home / Self Care | Payer: Medicare Other | Source: Ambulatory Visit | Attending: Radiation Oncology | Admitting: Radiation Oncology

## 2015-12-04 DIAGNOSIS — Z87891 Personal history of nicotine dependence: Secondary | ICD-10-CM | POA: Diagnosis not present

## 2015-12-04 DIAGNOSIS — C3411 Malignant neoplasm of upper lobe, right bronchus or lung: Secondary | ICD-10-CM | POA: Diagnosis not present

## 2015-12-04 DIAGNOSIS — Z510 Encounter for antineoplastic radiation therapy: Secondary | ICD-10-CM | POA: Diagnosis not present

## 2015-12-06 ENCOUNTER — Ambulatory Visit
Admission: RE | Admit: 2015-12-06 | Discharge: 2015-12-06 | Disposition: A | Discharge status: Home / Self Care | Payer: Medicare Other | Source: Ambulatory Visit | Attending: Radiation Oncology | Admitting: Radiation Oncology

## 2015-12-06 DIAGNOSIS — Z87891 Personal history of nicotine dependence: Secondary | ICD-10-CM | POA: Diagnosis not present

## 2015-12-06 DIAGNOSIS — C3411 Malignant neoplasm of upper lobe, right bronchus or lung: Secondary | ICD-10-CM | POA: Diagnosis not present

## 2015-12-06 DIAGNOSIS — Z510 Encounter for antineoplastic radiation therapy: Secondary | ICD-10-CM | POA: Diagnosis not present

## 2015-12-11 ENCOUNTER — Ambulatory Visit
Admission: RE | Admit: 2015-12-11 | Discharge: 2015-12-11 | Disposition: A | Discharge status: Home / Self Care | Payer: Medicare Other | Source: Ambulatory Visit | Attending: Radiation Oncology | Admitting: Radiation Oncology

## 2015-12-11 DIAGNOSIS — Z510 Encounter for antineoplastic radiation therapy: Secondary | ICD-10-CM | POA: Diagnosis not present

## 2015-12-11 DIAGNOSIS — Z87891 Personal history of nicotine dependence: Secondary | ICD-10-CM | POA: Diagnosis not present

## 2015-12-11 DIAGNOSIS — C3411 Malignant neoplasm of upper lobe, right bronchus or lung: Secondary | ICD-10-CM | POA: Diagnosis not present

## 2015-12-16 ENCOUNTER — Other Ambulatory Visit: Payer: Self-pay | Admitting: Internal Medicine

## 2015-12-26 ENCOUNTER — Ambulatory Visit (INDEPENDENT_AMBULATORY_CARE_PROVIDER_SITE_OTHER): Payer: Medicare Other | Admitting: General Surgery

## 2015-12-26 ENCOUNTER — Encounter: Payer: Self-pay | Admitting: General Surgery

## 2015-12-26 VITALS — BP 144/74 | HR 66 | Resp 14 | Wt 187.0 lb

## 2015-12-26 DIAGNOSIS — L729 Follicular cyst of the skin and subcutaneous tissue, unspecified: Secondary | ICD-10-CM

## 2015-12-26 NOTE — Patient Instructions (Addendum)
The patient is aware to call back for any questions or concerns. Patient to return as needed.

## 2015-12-26 NOTE — Progress Notes (Signed)
Patient ID: Howard [name], male   DOB: 11-Oct-1931, 82 y.o.   MRN: [id number]  Chief Complaint  Patient presents with  . Cyst    HPI Howard [name] is a 82 y.o. male.  Here today for evaluation of another cyst on his back. He states 2 weeks ago his noticed a knot upper left back that swelled, painful and red. He states it did drain 2-3 days ago and it much better now. He has completed his radiation treatments for adenocarcinoma of the lung. This was an incidental finding with persistent cough.  I reviewed the patient's history.  HPI    Past Medical History  Diagnosis Date  . Hyperlipidemia   . Hypertension   . Complete heart block ([place])     a. 11/2010 s/p SJM 2210 [facility] DC PPM, ser# [id number].  . WPW (Wolff-Parkinson-White syndrome)     a. S/P RFCA 1991.  . Sleep apnea     a. cpap  . Diabetes mellitus without complication ([place])   . LBBB (left bundle branch block)   . GERD (gastroesophageal reflux disease)   . Arthritis   . NICM (nonischemic cardiomyopathy) ([place])     a. 07/2014 Echo: EF 35-40%, mid-apicalanteroseptal DK, Gr 1 DD, mild-mod dil LA.  Marland Kitchen Chronic combined systolic and diastolic CHF, NYHA class 1 ([place])     a. 07/2014 Echo: EF 35-40%, Gr 1 DD.  Marland Kitchen Vertigo   . Depression   . Non-obstructive CAD     a. 07/2014 Abnl MV;  b. 08/2014 Cath: LM nl, LAD 30p, RI 40p, LCX nl, OM1 40, RCA dominant 30p, 70d-->Med Rx.  . Left-sided Bell's palsy   . Poor balance   . Fall 11-10-14  . Presence of permanent cardiac pacemaker   . Bell palsy   . Cancer [facility])     prostate and skin  . Lung cancer ([name]) 2016    Past Surgical History  Procedure Laterality Date  . Ruptured disc      1962 and 1998  . Cervical fusion    . Knee surgery      left knee 1991 and 1992; right knee 1995  . Prostate surgery      cancer--1998  . Replacement total knee      2004  . Hand surgery      right 1993; left 2005  . Back surgery      2011  . Pacemaker insertion      PPM-- St Jude 11/30/10 by [name]  .  Catheter ablation  1991    for WPW  . Joint replacement Left 2013    knee  . Joint replacement Right 2004    knee  . Lumbar laminectomy/decompression microdiscectomy N/A 06/07/2014    Procedure: LUMBAR FOUR TO FIVE LUMBAR LAMINECTOMY/DECOMPRESSION MICRODISCECTOMY 1 LEVEL;  Surgeon: [name], MD;  Location: [facility];  Service: Neurosurgery;  Laterality: N/A;  . Insert / replace / remove pacemaker    . Cardiac catheterization  08/26/2014    Single vessel obstructive CAD  . Left heart catheterization with coronary angiogram N/A 08/26/2014    Procedure: LEFT HEART CATHETERIZATION WITH CORONARY ANGIOGRAM;  Surgeon: [name], MD;  Location[facility] CATH LAB;  Service: Cardiovascular;  Laterality: N/A;  . Cataract extraction  07-31-11 and 09-18-11  . Hernia repair  1955  . Cholecystectomy  09-07-14  . Trigger finger release  01-24-15  . Carpal tunnel release  04-04-15    [facility]  . Wound debridement Left 06/07/2015  Procedure: DEBRIDEMENT WOUND;  Surgeon: Robert Bellow, MD;  Location: [facility] ORS;  Service: General;  Laterality: Left;  left upper back  . Application of wound vac Left 06/07/2015    Procedure: APPLICATION OF WOUND VAC;  Surgeon: Robert Bellow, MD;  Location: [facility] ORS;  Service: General;  Laterality: Left;  left upper back  . Lung biopsy Right 2016    Dr [name]    Family History  Problem Relation Age of Onset  . CAD    . Heart attack Mother   . Hyperlipidemia Mother     Social History Social History  Substance Use Topics  . Smoking status: Former Smoker -- 4 years  . Smokeless tobacco: Never Used     Comment: Quit 2011  . Alcohol Use: 0.6 oz/week    1 Cans of beer, 0 Standard drinks or equivalent per week     Comment: occasional    Allergies  Allergen Reactions  . Sulfonamide Derivatives Rash    Current Outpatient Prescriptions  Medication Sig Dispense Refill  . amLODipine (NORVASC) 5 MG tablet Take 1 tablet (5 mg total) by mouth daily. 90 tablet 3  . aspirin  81 MG tablet Take 81 mg by mouth daily.      . Cholecalciferol (VITAMIN D3) 1000 UNITS CAPS Take 1,000 Units by mouth daily.     . Colesevelam HCl (WELCHOL) 3.75 G PACK Take 3.75 g by mouth daily. Mix in [place] of water and drink.    . hydrochlorothiazide 25 MG tablet Take 1 tablet (25 mg total) by mouth daily. 90 tablet 3  . lisinopril (PRINIVIL,ZESTRIL) 10 MG tablet TAKE 1 TABLET DAILY 90 tablet 3  . metFORMIN (GLUCOPHAGE) 500 MG tablet Take 1 tablet (500 mg total) by mouth 2 (two) times daily. 180 tablet 1  . metoprolol succinate (TOPROL-XL) 25 MG 24 hr tablet TAKE 1 TABLET DAILY 90 tablet 3  . Multiple Vitamin (MULTIVITAMIN) capsule Take 1 capsule by mouth daily.      Marland Kitchen omeprazole (PRILOSEC) 40 MG capsule Take 40 mg by mouth 2 (two) times daily.    . simvastatin (ZOCOR) 40 MG tablet Take 1 tablet (40 mg total) by mouth at bedtime. 90 tablet 3   No current facility-administered medications for this visit.    Review of Systems Review of Systems  Constitutional: Negative.   Respiratory: Negative.   Cardiovascular: Negative.     Blood pressure 144/74, pulse 66, resp. rate 14, weight 187 lb (84.823 kg).  Physical Exam Physical Exam  Constitutional: He is oriented to person, place, and time. He appears well-developed and well-nourished.  Pulmonary/Chest:    Neurological: He is alert and oriented to person, place, and time.  Skin: Skin is warm and dry.       Assessment    Inflamed sebaceous cyst, resolved with spontaneous drainage.    Plan    No indication for reexcision at this time.    Patient to return as needed. Patient will report if he develops any further symptoms or swelling. PCP:  Margo Common This information has been scribed by Karie Fetch [place].   Robert Bellow 12/26/2015, 8:44 PM

## 2016-01-01 ENCOUNTER — Ambulatory Visit (INDEPENDENT_AMBULATORY_CARE_PROVIDER_SITE_OTHER): Payer: Medicare Other | Admitting: *Deleted

## 2016-01-01 DIAGNOSIS — I442 Atrioventricular block, complete: Secondary | ICD-10-CM

## 2016-01-02 NOTE — Progress Notes (Signed)
Remote pacemaker transmission.   

## 2016-01-03 DIAGNOSIS — L570 Actinic keratosis: Secondary | ICD-10-CM | POA: Diagnosis not present

## 2016-01-03 DIAGNOSIS — Z85828 Personal history of other malignant neoplasm of skin: Secondary | ICD-10-CM | POA: Diagnosis not present

## 2016-01-03 DIAGNOSIS — X32XXXA Exposure to sunlight, initial encounter: Secondary | ICD-10-CM | POA: Diagnosis not present

## 2016-01-03 DIAGNOSIS — D485 Neoplasm of uncertain behavior of skin: Secondary | ICD-10-CM | POA: Diagnosis not present

## 2016-01-03 DIAGNOSIS — L858 Other specified epidermal thickening: Secondary | ICD-10-CM | POA: Diagnosis not present

## 2016-01-15 ENCOUNTER — Encounter: Payer: Self-pay | Admitting: Radiation Oncology

## 2016-01-15 ENCOUNTER — Other Ambulatory Visit: Payer: Self-pay | Admitting: *Deleted

## 2016-01-15 ENCOUNTER — Ambulatory Visit
Admission: RE | Admit: 2016-01-15 | Discharge: 2016-01-15 | Disposition: A | Discharge status: Home / Self Care | Payer: Medicare Other | Source: Ambulatory Visit | Attending: Radiation Oncology | Admitting: Radiation Oncology

## 2016-01-15 VITALS — BP 137/71 | HR 71 | Temp 96.7°F | Resp 20 | Ht 69.0 in | Wt 190.0 lb

## 2016-01-15 DIAGNOSIS — C3411 Malignant neoplasm of upper lobe, right bronchus or lung: Secondary | ICD-10-CM

## 2016-01-15 NOTE — Progress Notes (Signed)
Radiation Oncology Follow up Note  Name: Howard [name]   Date:   01/15/2016 MRN:  [id number] DOB: [birth year]    This 82 y.o. male presents to the clinic today for follow-up for stage I adenocarcinoma the right lung status post SB RT.  REFERRING PROVIDER: Margo Common, PA  HPI: Patient is a 82 year old male now one month out having completed SB RT to his right upper lobe for a biopsy-proven adenocarcinoma with acinar pattern. He is seen today in routine follow-up and is doing well he's [name] no side effects from his treatments. He specifically denies cough a change in pulmonary status fatigue or dysphagia.  COMPLICATIONS OF TREATMENT: none  FOLLOW UP COMPLIANCE: keeps appointments   PHYSICAL EXAM:  BP 137/71 mmHg  Pulse 71  Temp(Src) 96.7 F (35.9 C)  Resp 20  Ht '5\' 9"'$  (1.753 m)  Wt 190 lb 0.6 oz (86.2 kg)  BMI 28.05 kg/m2 Well-developed well-nourished patient in NAD. HEENT reveals PERLA, EOMI, discs not visualized.  Oral cavity is clear. No oral mucosal lesions are identified. Neck is clear without evidence of cervical or supraclavicular adenopathy. Lungs are clear to A&P. Cardiac examination is essentially unremarkable with regular rate and rhythm without murmur rub or thrill. Abdomen is benign with no organomegaly or masses noted. Motor sensory and DTR levels are equal and symmetric in the upper and lower extremities. Cranial nerves II through XII are grossly intact. Proprioception is intact. No peripheral adenopathy or edema is identified. No motor or sensory levels are noted. Crude visual fields are within normal range.  RADIOLOGY RESULTS: CT scan of the chest with contrast has been ordered at about 2-1/2 months  PLAN: Present time clinically he is doing well 1 month out of SB RT to his right upper lobe. I'm please was overall progress. I've ordered a CT scan with contrast in about 2 and half months and scheduled a follow-up appointment with me shortly thereafter. I've  explained to lesser protocol for following lung nodules after SB RT. Patient knows to call sooner with any concerns. He has done extremely well with [name] no side effects from his treatments.  I would like to take this opportunity for allowing me to participate in the care of your patient.Armstead Peaks., MD

## 2016-01-21 LAB — CUP PACEART REMOTE DEVICE CHECK
Battery Voltage: 2.9 V
Brady Statistic AP VP Percent: 4.8 %
Brady Statistic AS VP Percent: 95 %
Brady Statistic RA Percent Paced: 4.3 %
Implantable Lead Location: 753860
Implantable Lead Model: 1948
Lead Channel Impedance Value: 360 Ohm
Lead Channel Pacing Threshold Amplitude: 0.75 V
Lead Channel Pacing Threshold Pulse Width: 0.8 ms
Lead Channel Sensing Intrinsic Amplitude: 12 mV
Lead Channel Setting Pacing Amplitude: 2 V
Lead Channel Setting Pacing Amplitude: 2.5 V
MDC IDC LEAD IMPLANT DT: 20111208
MDC IDC LEAD IMPLANT DT: 20111208
MDC IDC LEAD LOCATION: 753859
MDC IDC MSMT BATTERY REMAINING LONGEVITY: 59 mo
MDC IDC MSMT BATTERY REMAINING PERCENTAGE: 65 %
MDC IDC MSMT LEADCHNL RA IMPEDANCE VALUE: 450 Ohm
MDC IDC MSMT LEADCHNL RA PACING THRESHOLD PULSEWIDTH: 0.4 ms
MDC IDC MSMT LEADCHNL RA SENSING INTR AMPL: 4.3 mV
MDC IDC MSMT LEADCHNL RV PACING THRESHOLD AMPLITUDE: 0.75 V
MDC IDC SESS DTM: 20170109072051
MDC IDC SET LEADCHNL RV PACING PULSEWIDTH: 0.8 ms
MDC IDC SET LEADCHNL RV SENSING SENSITIVITY: 4 mV
MDC IDC STAT BRADY AP VS PERCENT: 1 %
MDC IDC STAT BRADY AS VS PERCENT: 1 %
MDC IDC STAT BRADY RV PERCENT PACED: 99 %
Pulse Gen Model: 2210
Pulse Gen Serial Number: 7196739

## 2016-01-22 ENCOUNTER — Other Ambulatory Visit: Payer: Self-pay | Admitting: Family Medicine

## 2016-01-24 ENCOUNTER — Encounter: Payer: Self-pay | Admitting: Cardiology

## 2016-02-14 DIAGNOSIS — C4442 Squamous cell carcinoma of skin of scalp and neck: Secondary | ICD-10-CM | POA: Diagnosis not present

## 2016-02-14 DIAGNOSIS — L905 Scar conditions and fibrosis of skin: Secondary | ICD-10-CM | POA: Diagnosis not present

## 2016-02-29 DIAGNOSIS — L905 Scar conditions and fibrosis of skin: Secondary | ICD-10-CM | POA: Diagnosis not present

## 2016-02-29 DIAGNOSIS — C44329 Squamous cell carcinoma of skin of other parts of face: Secondary | ICD-10-CM | POA: Diagnosis not present

## 2016-03-05 ENCOUNTER — Other Ambulatory Visit: Payer: Self-pay | Admitting: Family Medicine

## 2016-03-29 DIAGNOSIS — M79645 Pain in left finger(s): Secondary | ICD-10-CM | POA: Diagnosis not present

## 2016-04-01 ENCOUNTER — Ambulatory Visit (INDEPENDENT_AMBULATORY_CARE_PROVIDER_SITE_OTHER): Payer: Medicare Other | Admitting: *Deleted

## 2016-04-01 ENCOUNTER — Encounter: Payer: Self-pay | Admitting: Cardiovascular Disease

## 2016-04-01 ENCOUNTER — Ambulatory Visit (INDEPENDENT_AMBULATORY_CARE_PROVIDER_SITE_OTHER): Payer: Medicare Other | Admitting: Cardiovascular Disease

## 2016-04-01 ENCOUNTER — Telehealth: Payer: Self-pay | Admitting: Cardiology

## 2016-04-01 VITALS — BP 140/62 | HR 71 | Ht 69.0 in | Wt 192.2 lb

## 2016-04-01 DIAGNOSIS — I2510 Atherosclerotic heart disease of native coronary artery without angina pectoris: Secondary | ICD-10-CM

## 2016-04-01 DIAGNOSIS — I429 Cardiomyopathy, unspecified: Secondary | ICD-10-CM | POA: Diagnosis not present

## 2016-04-01 DIAGNOSIS — I442 Atrioventricular block, complete: Secondary | ICD-10-CM

## 2016-04-01 DIAGNOSIS — E119 Type 2 diabetes mellitus without complications: Secondary | ICD-10-CM

## 2016-04-01 DIAGNOSIS — I498 Other specified cardiac arrhythmias: Secondary | ICD-10-CM

## 2016-04-01 DIAGNOSIS — I459 Conduction disorder, unspecified: Secondary | ICD-10-CM | POA: Diagnosis not present

## 2016-04-01 DIAGNOSIS — I10 Essential (primary) hypertension: Secondary | ICD-10-CM

## 2016-04-01 DIAGNOSIS — C801 Malignant (primary) neoplasm, unspecified: Secondary | ICD-10-CM

## 2016-04-01 DIAGNOSIS — E785 Hyperlipidemia, unspecified: Secondary | ICD-10-CM

## 2016-04-01 DIAGNOSIS — I428 Other cardiomyopathies: Secondary | ICD-10-CM

## 2016-04-01 NOTE — Assessment & Plan Note (Signed)
We have encouraged continued exercise, careful diet management in an effort to lose weight. 

## 2016-04-01 NOTE — Assessment & Plan Note (Signed)
He appears euvolemic on today's visit, recommended he continue his lisinopril, metoprolol, HCTZ He does have significant underlying coronary disease

## 2016-04-01 NOTE — Patient Instructions (Signed)
You are doing well. No medication changes were made.  Please call us if you have new issues that need to be addressed before your next appt.  Your physician wants you to follow-up in: 6 months.  You will receive a reminder letter in the mail two months in advance. If you don't receive a letter, please call our office to schedule the follow-up appointment.   

## 2016-04-01 NOTE — Progress Notes (Signed)
Patient ID: Howard [name], male    DOB: 08/31/1931, 82 y.o.   MRN: [id number]  HPI Comments: [name] is a 82 year old-year-old gentleman, patient of [facility], with a hx of complete heart block, status post pacemaker in 2011, nonischemic cardiomyopathy, 70% distal RCA disease, 30 and 40% lesions in his ramus, LAD, with recent admission to the hospital 09/05/2012 with discharge 09/09/2014 with acute cholecystitis, ileus. He presents for follow-up of his CAD  In follow-up, he reports that he is doing well.  He reports exercising 3 days per week, no symptoms of chest pain or shortness of breath He was in the hospital November 2016 for abscess on his back, had surgery Nodules found in the right lung, biopsy documenting adenocarcinoma 1.3 cm, treated with radiation  Lab work reviewed with him showing total cholesterol 133, hemoglobin A1c 7.1  CT scan of the chest reviewed with him showing at least moderate coronary calcifications in the LAD and diagonal branches, ramus Images discussed with him in detail, shown to him in clinic Also with at least mild diffuse aortic atherosclerosis, mild carotid disease  EKG on today's visit shows paced rhythm, rate 71 bpm, PVCs  Other past medical history reviewed vertigo February 2016  In early September 2015 he was seen in the [place] office with symptoms of chest pain and shortness of breath,  he had a Abnormal Myoview  cardiac catheterization showing details above Echocardiogram showing ejection fraction 35-40%  After discharge she continued to have similar symptoms of epigastric discomfort radiating through to his back Admitted to the hospital for cholecystectomy  Allergies  Allergen Reactions  . Sulfonamide Derivatives Rash    Outpatient Encounter Prescriptions as of 04/01/2016  Medication Sig  . amLODipine (NORVASC) 5 MG tablet TAKE 1 TABLET DAILY  . aspirin 81 MG tablet Take 81 mg by mouth daily.    . Cholecalciferol  (VITAMIN D3) 1000 UNITS CAPS Take 1,000 Units by mouth daily.   . Colesevelam HCl (WELCHOL) 3.75 G PACK Take 3.75 g by mouth daily. Mix in Grabill of water and drink.  . hydrochlorothiazide (HYDRODIURIL) 25 MG tablet TAKE 1 TABLET DAILY  . lisinopril (PRINIVIL,ZESTRIL) 10 MG tablet TAKE 1 TABLET DAILY  . metFORMIN (GLUCOPHAGE) 500 MG tablet TAKE 1 TABLET TWICE A DAY  . metoprolol succinate (TOPROL-XL) 25 MG 24 hr tablet TAKE 1 TABLET DAILY  . Multiple Vitamin (MULTIVITAMIN) capsule Take 1 capsule by mouth daily.    Marland Kitchen omeprazole (PRILOSEC) 40 MG capsule Take 40 mg by mouth 2 (two) times daily.  . simvastatin (ZOCOR) 40 MG tablet Take 1 tablet (40 mg total) by mouth at bedtime.   No facility-administered encounter medications on file as of 04/01/2016.    Past Medical History  Diagnosis Date  . Hyperlipidemia   . Hypertension   . Complete heart block ([place])     a. 11/2010 s/p SJM 2210 Accent DC PPM, ser# [id number].  . WPW (Wolff-Parkinson-White syndrome)     a. S/P RFCA 1991.  . Sleep apnea     a. cpap  . Diabetes mellitus without complication ([place])   . LBBB (left bundle branch block)   . GERD (gastroesophageal reflux disease)   . Arthritis   . NICM (nonischemic cardiomyopathy) ([place])     a. 07/2014 Echo: EF 35-40%, mid-apicalanteroseptal DK, Gr 1 DD, mild-mod dil LA.  Marland Kitchen Chronic combined systolic and diastolic CHF, NYHA class 1 ([place])     a. 07/2014 Echo: EF 35-40%, Gr 1 DD.  Marland Kitchen  Vertigo   . Depression   . Non-obstructive CAD     a. 07/2014 Abnl MV;  b. 08/2014 Cath: LM nl, LAD 30p, RI 40p, LCX nl, OM1 40, RCA dominant 30p, 70d-->Med Rx.  . Left-sided Bell's palsy   . Poor balance   . Fall 11-10-14  . Presence of permanent cardiac pacemaker   . Bell palsy   . Cancer [facility])     prostate and skin  . Lung cancer ([place]) 2016    Past Surgical History  Procedure Laterality Date  . Ruptured disc      1962 and 1998  . Cervical fusion    . Knee surgery      left knee 1991 and 1992; right knee  1995  . Prostate surgery      cancer--1998  . Replacement total knee      2004  . Hand surgery      right 1993; left 2005  . Back surgery      2011  . Pacemaker insertion      PPM-- St Jude 11/30/10 by [name]  . Catheter ablation  1991    for WPW  . Joint replacement Left 2013    knee  . Joint replacement Right 2004    knee  . Lumbar laminectomy/decompression microdiscectomy N/A 06/07/2014    Procedure: LUMBAR FOUR TO FIVE LUMBAR LAMINECTOMY/DECOMPRESSION MICRODISCECTOMY 1 LEVEL;  Surgeon: [name], MD;  Location: [place] [facility]URO ORS;  Service: Neurosurgery;  Laterality: N/A;  . Insert / replace / remove pacemaker    . Cardiac catheterization  08/26/2014    Single vessel obstructive CAD  . Left heart catheterization with coronary angiogram N/A 08/26/2014    Procedure: LEFT HEART CATHETERIZATION WITH CORONARY ANGIOGRAM;  Surgeon: [name], MD;  Location: [facility] CATH LAB;  Service: Cardiovascular;  Laterality: N/A;  . Cataract extraction  07-31-11 and 09-18-11  . Hernia repair  1955  . Cholecystectomy  09-07-14  . Trigger finger release  01-24-15  . Carpal tunnel release  04-04-15    [facility]  . Wound debridement Left 06/07/2015    Procedure: DEBRIDEMENT WOUND;  Surgeon: Robert Bellow, MD;  Location: [facility] ORS;  Service: General;  Laterality: Left;  left upper back  . Application of wound vac Left 06/07/2015    Procedure: APPLICATION OF WOUND VAC;  Surgeon: Robert Bellow, MD;  Location: [facility] ORS;  Service: General;  Laterality: Left;  left upper back  . Lung biopsy Right 2016    Dr [name]    Social History  reports that he has quit smoking. He has never used smokeless tobacco. He reports that he drinks about 0.6 oz of alcohol per week. He reports that he does not use illicit drugs.  Family History family history includes Heart attack in his mother; Hyperlipidemia in his mother.   Review of Systems  Constitutional: Negative.   Respiratory: Negative.   Cardiovascular: Negative.    Gastrointestinal: Negative.   Musculoskeletal: Negative.   Neurological: Negative.   Hematological: Negative.   Psychiatric/Behavioral: Negative.   All other systems reviewed and are negative.   BP 140/62 mmHg  Pulse 71  Ht '5\' 9"'$  (1.753 m)  Wt 192 lb 4 oz (87.204 kg)  BMI 28.38 kg/m2  Physical Exam  Constitutional: He is oriented to person, place, and time. He appears well-developed and well-nourished.  HENT:  Head: Normocephalic.  Nose: Nose normal.  Mouth/Throat: Oropharynx is clear and moist.  Eyes: Conjunctivae are normal. Pupils are equal, round,  and reactive to light.  Neck: Normal range of motion. Neck supple. No JVD present.  Cardiovascular: Normal rate, regular rhythm, S1 normal, S2 normal, normal heart sounds and intact distal pulses.  Exam reveals no gallop and no friction rub.   No murmur heard. Pulmonary/Chest: Effort normal and breath sounds normal. No respiratory distress. He has no wheezes. He has no rales. He exhibits no tenderness.  Abdominal: Soft. Bowel sounds are normal. He exhibits no distension. There is no tenderness.  Musculoskeletal: Normal range of motion. He exhibits no edema or tenderness.  Lymphadenopathy:    He has no cervical adenopathy.  Neurological: He is alert and oriented to person, place, and time. Coordination normal.  Skin: Skin is warm and dry. No rash noted. No erythema.  Psychiatric: He has a normal mood and affect. His behavior is normal. Judgment and thought content normal.      Assessment and Plan   Nursing note and vitals reviewed.

## 2016-04-01 NOTE — Assessment & Plan Note (Signed)
CT scan images reviewed with him in detail in the clinic He denies any symptoms concerning for angina Recommended if he does have symptoms, that he call he office for stress testing or catheterization Stressed importance of aggressive diabetes control. Cholesterol at goal

## 2016-04-01 NOTE — Progress Notes (Signed)
Remote pacemaker transmission.   

## 2016-04-01 NOTE — Assessment & Plan Note (Signed)
Adenocarcinoma of lung, status post radiation treatment Has follow-up CT scan next month

## 2016-04-01 NOTE — Telephone Encounter (Signed)
Spoke with pt and reminded pt of remote transmission that is due today. Pt verbalized understanding.   

## 2016-04-01 NOTE — Assessment & Plan Note (Signed)
Cholesterol is at goal on the current lipid regimen. No changes to the medications were made.  

## 2016-04-01 NOTE — Assessment & Plan Note (Signed)
Blood pressure is well controlled on today's visit. No changes made to the medications.   Total encounter time more than 25 minutes  Greater than 50% was spent in counseling and coordination of care with the patient  

## 2016-04-09 ENCOUNTER — Ambulatory Visit: Payer: Medicare Other | Attending: Physician Assistant | Admitting: Occupational Therapy

## 2016-04-09 DIAGNOSIS — M79642 Pain in left hand: Secondary | ICD-10-CM | POA: Diagnosis not present

## 2016-04-09 DIAGNOSIS — M25642 Stiffness of left hand, not elsewhere classified: Secondary | ICD-10-CM | POA: Diagnosis not present

## 2016-04-09 NOTE — Therapy (Signed)
[facility] [address], [place], [address] Phone: [phone]   Fax:  [phone]  Occupational Therapy Treatment  Patient Details  Name: Howard [name] MRN: [id number] Date of Birth: 1931/07/06 Referring Provider: Loman Brooklyn  Encounter Date: 04/09/2016      OT End of Session - 04/09/16 1332    Visit Number 1   Number of Visits 4   Date for OT Re-Evaluation 05/07/16   OT Start Time 1114   OT Stop Time 1156   OT Time Calculation (min) 42 min   Activity Tolerance Patient tolerated treatment well   Behavior During Therapy [facility] for tasks assessed/performed      Past Medical History  Diagnosis Date  . Hyperlipidemia   . Hypertension   . Complete heart block ([place])     a. 11/2010 s/p SJM 2210 Accent DC PPM, ser# [id number].  . WPW (Wolff-Parkinson-White syndrome)     a. S/P RFCA 1991.  . Sleep apnea     a. cpap  . Diabetes mellitus without complication ([place])   . LBBB (left bundle branch block)   . GERD (gastroesophageal reflux disease)   . Arthritis   . NICM (nonischemic cardiomyopathy) ([place])     a. 07/2014 Echo: EF 35-40%, mid-apicalanteroseptal DK, Gr 1 DD, mild-mod dil LA.  Marland Kitchen Chronic combined systolic and diastolic CHF, NYHA class 1 ([place])     a. 07/2014 Echo: EF 35-40%, Gr 1 DD.  Marland Kitchen Vertigo   . Depression   . Non-obstructive CAD     a. 07/2014 Abnl MV;  b. 08/2014 Cath: LM nl, LAD 30p, RI 40p, LCX nl, OM1 40, RCA dominant 30p, 70d-->Med Rx.  . Left-sided Bell's palsy   . Poor balance   . Fall 11-10-14  . Presence of permanent cardiac pacemaker   . Bell palsy   . Cancer [facility])     prostate and skin  . Lung cancer ([place]) 2016    Past Surgical History  Procedure Laterality Date  . Ruptured disc      1962 and 1998  . Cervical fusion    . Knee surgery      left knee 1991 and 1992; right knee 1995  . Prostate surgery      cancer--1998  . Replacement total knee      2004  . Hand surgery      right  1993; left 2005  . Back surgery      2011  . Pacemaker insertion      PPM-- St Jude 11/30/10 by [name]  . Catheter ablation  1991    for WPW  . Joint replacement Left 2013    knee  . Joint replacement Right 2004    knee  . Lumbar laminectomy/decompression microdiscectomy N/A 06/07/2014    Procedure: LUMBAR FOUR TO FIVE LUMBAR LAMINECTOMY/DECOMPRESSION MICRODISCECTOMY 1 LEVEL;  Surgeon: [name], MD;  Location: [place] NEURO ORS;  Service: Neurosurgery;  Laterality: N/A;  . Insert / replace / remove pacemaker    . Cardiac catheterization  08/26/2014    Single vessel obstructive CAD  . Left heart catheterization with coronary angiogram N/A 08/26/2014    Procedure: LEFT HEART CATHETERIZATION WITH CORONARY ANGIOGRAM;  Surgeon: [name], MD;  Location: [facility] CATH LAB;  Service: Cardiovascular;  Laterality: N/A;  . Cataract extraction  07-31-11 and 09-18-11  . Hernia repair  1955  . Cholecystectomy  09-07-14  . Trigger finger release  01-24-15  . Carpal tunnel  release  04-04-15    [facility]  . Wound debridement Left 06/07/2015    Procedure: DEBRIDEMENT WOUND;  Surgeon: Robert Bellow, MD;  Location: [facility] ORS;  Service: General;  Laterality: Left;  left upper back  . Application of wound vac Left 06/07/2015    Procedure: APPLICATION OF WOUND VAC;  Surgeon: Robert Bellow, MD;  Location: [facility] ORS;  Service: General;  Laterality: Left;  left upper back  . Lung biopsy Right 2016    Dr [name]    There were no vitals filed for this visit.      Subjective Assessment - 04/09/16 1324    Subjective  I had spring hit my thumb about 6-8 months ago and just let it heal myself- and now cannot bend the tip of thumb    Patient Stated Goals Want the tip of my thumb to bend - that I can do my buttons maybe with more ease , repair clocks with more ease, pick up small objects   Currently in Pain? 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 - not as much anymore - bird watching, some yard work ,  read news paper,  on desk top computer some ,    Strength   Right Hand Grip (lbs) 35   Right Hand Lateral Pinch 14 lbs   Right Hand 3 Point Pinch 10 lbs   Left Hand Grip (lbs) 28   Left Hand Lateral Pinch 10 lbs   Left Hand 3 Point Pinch 10 lbs   Right Hand AROM   R Thumb MCP 0-60 66 Degrees   R Thumb IP 0-80 70 Degrees   Left Hand AROM   L Thumb MCP 0-60 60 Degrees   L Thumb IP 0-80 30 Degrees  PROM 72 - and end of session 40      [place] prior to ROM and review of HEP - 10 min  Pt showed increase IP flexion after review of HEP 40 degrees                     OT Education - 04/09/16 1331    Education provided Yes   Education Details HEP   Person(s) Educated Patient   Methods Explanation;Demonstration;Tactile cues;Verbal cues;Handout   Comprehension Returned demonstration;Verbalized understanding;Verbal cues required          OT Short Term Goals - 04/09/16 1348    OT SHORT TERM GOAL #1   Title L thumb IP flexion improve with 20 degrees to report increase ease with buttons or picking up small objects   Baseline IP L 30 flexion, R 70    Time 3   Period Weeks   Status New           OT Long Term Goals - 04/09/16 1349    OT LONG TERM GOAL #1   Title Pt to be ind in HEP to increase L IP flexion , increase use and decreaes pain at A1pulley at volar MP    Baseline education started this date    Time 4   Period Weeks   Status New  Plan - 04/09/16 1332    Clinical Impression Statement Pt present at eval - months after a soft tissue injury to radial side of thumb - pt since then show decrease thumb IP flexion - did had tenderness /pain over volar MP of thumb - over A1pulley - pt has history of  trigger finger releases  on  most all digtis bilaterral hands -  pt show decrease AROM at IP on L , but show increase ROM  at end of session    Rehab Potential Fair   OT Frequency 1x / week   OT Duration 4 weeks   OT Treatment/Interventions Self-care/ADL training;Patient/family education;Manual Therapy;Parrafin;Ultrasound;Passive range of motion   Plan assess AROM at IP of thumb at [facility] , increase IP flexion - ? pain at [place] see pt instruction    Consulted and Agree with Plan of Care Patient      Patient will benefit from skilled therapeutic intervention in order to improve the following deficits and impairments:  Impaired flexibility, Decreased range of motion, Pain  Visit Diagnosis: Stiffness of left hand, not elsewhere classified - Plan: Ot plan of care cert/re-cert  Pain in left hand - Plan: Ot plan of care cert/re-cert      G-Codes - 03/83/33 1352    Functional Assessment Tool Used ROM  , strength, PRWHE, pain , clinical judgement    Functional Limitation Self care   Self Care Current Status (O3291) At least 1 percent but less than 20 percent impaired, limited or restricted   Self Care Goal Status (B1660) 0 percent impaired, limited or restricted      Problem List Patient Active Problem List   Diagnosis Date Noted  . CAD (coronary artery disease) 04/01/2016  . Adenocarcinoma ([place]) 04/01/2016  . Skin cyst 12/26/2015  . GERD (gastroesophageal reflux disease) 06/16/2015  . Abscess of back 06/06/2015  . Vertigo 03/27/2015  . Carpal tunnel syndrome 10/28/2014  . Status post cholecystectomy 09/29/2014  . Nonischemic cardiomyopathy ([place]) 09/26/2014  . Diabetes mellitus without complication ([place])   . Sleep apnea   . Spinal stenosis, lumbar region, with neurogenic claudication 06/07/2014  . Lumbar stenosis with neurogenic claudication 06/07/2014  . Abnormal gait 08/20/2012  . H/O total knee replacement 08/20/2012  . Arthritis of knee, degenerative 08/20/2012  .  Pacemaker-St.Jude 08/03/2012  . Cardiac conduction disorder 06/19/2012  . Acid reflux 06/18/2012  . 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 Triggering of digit 03/25/2012  . Hyperlipidemia 12/23/2011  . Essential hypertension 03/23/2011  . Complete heart block ([place]) 03/23/2011    Loys Shugars  OTR/L,CLT   04/09/2016, 1:56 PM  [facility] [address], [place], [address] Phone: [phone]   Fax:  [phone]  Name: Howard [name] MRN: [id number] Date of Birth: Jul 01, 1931

## 2016-04-09 NOTE — Patient Instructions (Signed)
Heat   PROM for IP flexion of thumb  Thumb composite flexion   place and hold for IP  AROM  Composite flexion of thumb - sliding down 5th  Ice over base of thumb - A1 pulley - as needed

## 2016-04-10 ENCOUNTER — Ambulatory Visit
Admission: RE | Admit: 2016-04-10 | Discharge: 2016-04-10 | Disposition: A | Discharge status: Home / Self Care | Payer: Medicare Other | Source: Ambulatory Visit | Attending: Radiation Oncology | Admitting: Radiation Oncology

## 2016-04-10 DIAGNOSIS — I2510 Atherosclerotic heart disease of native coronary artery without angina pectoris: Secondary | ICD-10-CM | POA: Insufficient documentation

## 2016-04-10 DIAGNOSIS — Z923 Personal history of irradiation: Secondary | ICD-10-CM | POA: Diagnosis not present

## 2016-04-10 DIAGNOSIS — C3411 Malignant neoplasm of upper lobe, right bronchus or lung: Secondary | ICD-10-CM | POA: Diagnosis not present

## 2016-04-10 DIAGNOSIS — I708 Atherosclerosis of other arteries: Secondary | ICD-10-CM | POA: Diagnosis not present

## 2016-04-10 LAB — POCT I-STAT CREATININE: Creatinine, Ser: 0.8 mg/dL (ref 0.61–1.24)

## 2016-04-10 MED ORDER — IOPAMIDOL (ISOVUE-300) INJECTION 61%
75.0000 mL | Freq: Once | INTRAVENOUS | Status: AC | PRN
  Administered 2016-04-10: 75 mL via INTRAVENOUS

## 2016-04-15 ENCOUNTER — Ambulatory Visit
Admission: RE | Admit: 2016-04-15 | Discharge: 2016-04-15 | Disposition: A | Discharge status: Home / Self Care | Payer: Medicare Other | Source: Ambulatory Visit | Attending: Radiation Oncology | Admitting: Radiation Oncology

## 2016-04-15 ENCOUNTER — Other Ambulatory Visit: Payer: Self-pay | Admitting: *Deleted

## 2016-04-15 ENCOUNTER — Encounter: Payer: Self-pay | Admitting: Radiation Oncology

## 2016-04-15 VITALS — BP 152/73 | HR 76 | Temp 98.0°F | Wt 195.3 lb

## 2016-04-15 DIAGNOSIS — F17210 Nicotine dependence, cigarettes, uncomplicated: Secondary | ICD-10-CM | POA: Diagnosis not present

## 2016-04-15 DIAGNOSIS — C3411 Malignant neoplasm of upper lobe, right bronchus or lung: Secondary | ICD-10-CM | POA: Diagnosis not present

## 2016-04-15 NOTE — Progress Notes (Signed)
Radiation Oncology Follow up Note  Name: Howard [name]   Date:   04/15/2016 MRN:  [id number] DOB: 1931-11-17    This 82 y.o. male presents to the clinic today for follow-up for stage I adenocarcinoma of the right lung status post SB RT now out 4 months.  REFERRING PROVIDER: Margo Common, PA  HPI: Patient is an 82 year old male now out 4 months having completed SB RT to his right lung for stage I adenocarcinoma of his right upper lobe. He is seen today in routine follow-up and is doing well specifically denies cough chest tightness any marked shortness of breath or dyspnea on exertion. A few weeks ago he had had a CT scan showing. Decreased size in his right upper lobe lesion associated with good response. As a side note he had developing stenosis of the origin the right subclavian artery which has already been brought to the attention of his cardiologist.  COMPLICATIONS OF TREATMENT: none  FOLLOW UP COMPLIANCE: keeps appointments   PHYSICAL EXAM:  BP 152/73 mmHg  Pulse 76  Temp(Src) 98 F (36.7 C)  Wt 195 lb 5.2 oz (88.6 kg) Well-developed well-nourished patient in NAD. HEENT reveals PERLA, EOMI, discs not visualized.  Oral cavity is clear. No oral mucosal lesions are identified. Neck is clear without evidence of cervical or supraclavicular adenopathy. Lungs are clear to A&P. Cardiac examination is essentially unremarkable with regular rate and rhythm without murmur rub or thrill. Abdomen is benign with no organomegaly or masses noted. Motor sensory and DTR levels are equal and symmetric in the upper and lower extremities. Cranial nerves II through XII are grossly intact. Proprioception is intact. No peripheral adenopathy or edema is identified. No motor or sensory levels are noted. Crude visual fields are within normal range.  RADIOLOGY RESULTS: CT scan is reviewed  PLAN: At the present time he is doing well excellent response by CT criteria. I have asked to see him back in 6 months  for follow-up and will obtain a CT scan again with prior to his visit. Patient wife know to call with any concerns. He continues to do extremely well. He will follow-up with his cardiologist as they continue to observe his potential stenosis of the right subclavian artery.  I would like to take this opportunity for allowing me to participate in the care of your patient.Armstead Peaks., MD

## 2016-04-16 ENCOUNTER — Ambulatory Visit: Payer: Medicare Other | Admitting: Occupational Therapy

## 2016-04-16 DIAGNOSIS — M25642 Stiffness of left hand, not elsewhere classified: Secondary | ICD-10-CM | POA: Diagnosis not present

## 2016-04-16 DIAGNOSIS — M79642 Pain in left hand: Secondary | ICD-10-CM

## 2016-04-16 NOTE — Therapy (Signed)
[facility] [address], [place], [address] Phone: [phone]   Fax:  [phone]  Occupational Therapy Treatment  Patient Details  Name: Howard [name] MRN: [id number] Date of Birth: 10/11/1931 Referring Provider: Loman Brooklyn  Encounter Date: 04/16/2016      OT End of Session - 04/16/16 1215    Visit Number 2   Number of Visits 4   Date for OT Re-Evaluation 05/07/16   OT Start Time 1001   OT Stop Time 1044   OT Time Calculation (min) 43 min   Activity Tolerance Patient tolerated treatment well   Behavior During Therapy [facility] for tasks assessed/performed      Past Medical History  Diagnosis Date  . Hyperlipidemia   . Hypertension   . Complete heart block ([place])     a. 11/2010 s/p SJM 2210 Accent DC PPM, ser# [id number].  . WPW (Wolff-Parkinson-White syndrome)     a. S/P RFCA 1991.  . Sleep apnea     a. cpap  . Diabetes mellitus without complication ([place])   . LBBB (left bundle branch block)   . GERD (gastroesophageal reflux disease)   . Arthritis   . NICM (nonischemic cardiomyopathy) ([place])     a. 07/2014 Echo: EF 35-40%, mid-apicalanteroseptal DK, Gr 1 DD, mild-mod dil LA.  Marland Kitchen Chronic combined systolic and diastolic CHF, NYHA class 1 ([place])     a. 07/2014 Echo: EF 35-40%, Gr 1 DD.  Marland Kitchen Vertigo   . Depression   . Non-obstructive CAD     a. 07/2014 Abnl MV;  b. 08/2014 Cath: LM nl, LAD 30p, RI 40p, LCX nl, OM1 40, RCA dominant 30p, 70d-->Med Rx.  . Left-sided Bell's palsy   . Poor balance   . Fall 11-10-14  . Presence of permanent cardiac pacemaker   . Bell palsy   . Cancer [facility])     prostate and skin  . Lung cancer ([place]) 2016    Past Surgical History  Procedure Laterality Date  . Ruptured disc      1962 and 1998  . Cervical fusion    . Knee surgery      left knee 1991 and 1992; right knee 1995  . Prostate surgery      cancer--1998  . Replacement total knee      2004  . Hand surgery      right  1993; left 2005  . Back surgery      2011  . Pacemaker insertion      PPM-- St Jude 11/30/10 by [name]  . Catheter ablation  1991    for WPW  . Joint replacement Left 2013    knee  . Joint replacement Right 2004    knee  . Lumbar laminectomy/decompression microdiscectomy N/A 06/07/2014    Procedure: LUMBAR FOUR TO FIVE LUMBAR LAMINECTOMY/DECOMPRESSION MICRODISCECTOMY 1 LEVEL;  Surgeon: [name], MD;  Location: [facility];  Service: Neurosurgery;  Laterality: N/A;  . Insert / replace / remove pacemaker    . Cardiac catheterization  08/26/2014    Single vessel obstructive CAD  . Left heart catheterization with coronary angiogram N/A 08/26/2014    Procedure: LEFT HEART CATHETERIZATION WITH CORONARY ANGIOGRAM;  Surgeon: [name], MD;  Location[facility] C[facility] LAB;  Service: Cardiovascular;  Laterality: N/A;  . Cataract extraction  07-31-11 and 09-18-11  . Hernia repair  1955  . Cholecystectomy  09-07-14  . Trigger finger release  01-24-15  . Carpal tunnel  release  04-04-15    [facility]  . Wound debridement Left 06/07/2015    Procedure: DEBRIDEMENT WOUND;  Surgeon: Robert Bellow, MD;  Location: [facility] ORS;  Service: General;  Laterality: Left;  left upper back  . Application of wound vac Left 06/07/2015    Procedure: APPLICATION OF WOUND VAC;  Surgeon: Robert Bellow, MD;  Location: [facility] ORS;  Service: General;  Laterality: Left;  left upper back  . Lung biopsy Right 2016    Dr [name]    There were no vitals filed for this visit.      Subjective Assessment - 04/16/16 1208    Subjective  I am bending it about the same - really worked it the other day - then I inflammed it - it also locked where I could actively lock and unlock my thumb    Patient Stated Goals Want the tip of my thumb to bend - that I can do my buttons maybe with more ease , repair clocks with more ease, pick up small objects   Currently in Pain? No/denies                      OT Treatments/Exercises (OP) - 04/16/16  0001    Cryotherapy   Number Minutes Cryotherapy 5 Minutes   Cryotherapy Location Hand   Type of Cryotherapy Ice pack   Ultrasound   Ultrasound Location A1pulley of L thumb    Ultrasound Parameters 3.3MHZ at 20% , 1.0 intensity ,    Ultrasound Goals Other (Comment)  decrease inflammation at end of session    LUE Paraffin   Number Minutes Paraffin 10 Minutes   LUE Paraffin Location Hand   Comments at [facility] to increase IP flexion per MD order - gained to 50 degrees     After parafin  Did prolonged stretch for IP flexion- 5 reps hold 15 sec  PROM IP flexion  Place and hold AROM for IP flexion   AROM blocked  Pt able to do 40 AROM  But then was able towards end of session 50 degrees - and during composite flexion of thumb to base of 5th - IP locked  Pt could not unlock until OT did ice for 5 min              OT Education - 04/16/16 1215    Education provided Yes   Education Details HEP   Person(s) Educated Patient   Methods Explanation;Demonstration;Tactile cues;Verbal cues   Comprehension Verbal cues required;Returned demonstration;Verbalized understanding          OT Short Term Goals - 04/09/16 1348    OT SHORT TERM GOAL #1   Title L thumb IP flexion improve with 20 degrees to report increase ease with buttons or picking up small objects   Baseline IP L 30 flexion, R 70    Time 3   Period Weeks   Status New           OT Long Term Goals - 04/09/16 1349    OT LONG TERM GOAL #1   Title Pt to be ind in HEP to increase L IP flexion , increase use and decreaes pain at A1pulley at volar MP    Baseline education started this date    Time 4   Period Weeks   Status New               Plan - 04/16/16 1216    Clinical Impression Statement Pt showed increase  AROM of 5degrees of IP in at start- but during session pt IP locked - did about 50 degrees - had to do some ice to unlock - pt to not do aggrassive because he has the ROM - pt appt with MD 04/26/16   Rehab  Potential Fair   OT Frequency 1x / week   OT Duration 2 weeks   OT Treatment/Interventions Self-care/ADL training;Patient/family education;Manual Therapy;Parrafin;Ultrasound;Passive range of motion   Plan assess if locked at home -pain and assess IP flexion at [place] see pt instruction    Consulted and Agree with Plan of Care Patient      Patient will benefit from skilled therapeutic intervention in order to improve the following deficits and impairments:  Impaired flexibility, Decreased range of motion, Pain  Visit Diagnosis: Stiffness of left hand, not elsewhere classified  Pain in left hand    Problem List Patient Active Problem List   Diagnosis Date Noted  . CAD (coronary artery disease) 04/01/2016  . Adenocarcinoma ([place]) 04/01/2016  . Skin cyst 12/26/2015  . GERD (gastroesophageal reflux disease) 06/16/2015  . Abscess of back 06/06/2015  . Vertigo 03/27/2015  . Carpal tunnel syndrome 10/28/2014  . Status post cholecystectomy 09/29/2014  . Nonischemic cardiomyopathy ([place]) 09/26/2014  . Diabetes mellitus without complication ([place])   . Sleep apnea   . Spinal stenosis, lumbar region, with neurogenic claudication 06/07/2014  . Lumbar stenosis with neurogenic claudication 06/07/2014  . Abnormal gait 08/20/2012  . H/O total knee replacement 08/20/2012  . Arthritis of knee, degenerative 08/20/2012  . Pacemaker-St.Jude 08/03/2012  . Cardiac conduction disorder 06/19/2012  . Acid reflux 06/18/2012  . 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 Triggering of digit 03/25/2012  . Hyperlipidemia 12/23/2011  . Essential hypertension 03/23/2011  . Complete heart block ([place]) 03/23/2011    Rosalyn Gess OTR/L,CLT  04/16/2016, 12:19 PM  [facility] [address] [place], [address] Phone: [phone]   Fax:  [phone]  Name: BUNYAN BRIER MRN: [id number] Date of Birth: 02/07/1931

## 2016-04-16 NOTE — Patient Instructions (Signed)
Same HEP - but not over do  appt with MD next Friday

## 2016-04-18 ENCOUNTER — Ambulatory Visit (INDEPENDENT_AMBULATORY_CARE_PROVIDER_SITE_OTHER): Payer: Medicare Other | Admitting: Family Medicine

## 2016-04-18 ENCOUNTER — Encounter: Payer: Self-pay | Admitting: Family Medicine

## 2016-04-18 VITALS — BP 104/68 | HR 70 | Temp 98.1°F | Resp 16 | Wt 193.8 lb

## 2016-04-18 DIAGNOSIS — C801 Malignant (primary) neoplasm, unspecified: Secondary | ICD-10-CM

## 2016-04-18 DIAGNOSIS — I429 Cardiomyopathy, unspecified: Secondary | ICD-10-CM

## 2016-04-18 DIAGNOSIS — I10 Essential (primary) hypertension: Secondary | ICD-10-CM | POA: Diagnosis not present

## 2016-04-18 DIAGNOSIS — I2510 Atherosclerotic heart disease of native coronary artery without angina pectoris: Secondary | ICD-10-CM

## 2016-04-18 DIAGNOSIS — E785 Hyperlipidemia, unspecified: Secondary | ICD-10-CM

## 2016-04-18 DIAGNOSIS — C342 Malignant neoplasm of middle lobe, bronchus or lung: Secondary | ICD-10-CM | POA: Diagnosis not present

## 2016-04-18 DIAGNOSIS — Z96653 Presence of artificial knee joint, bilateral: Secondary | ICD-10-CM | POA: Diagnosis not present

## 2016-04-18 DIAGNOSIS — Z0000 Encounter for general adult medical examination without abnormal findings: Secondary | ICD-10-CM | POA: Diagnosis not present

## 2016-04-18 DIAGNOSIS — K2100 Gastro-esophageal reflux disease with esophagitis, without bleeding: Secondary | ICD-10-CM

## 2016-04-18 DIAGNOSIS — E119 Type 2 diabetes mellitus without complications: Secondary | ICD-10-CM | POA: Diagnosis not present

## 2016-04-18 DIAGNOSIS — Z23 Encounter for immunization: Secondary | ICD-10-CM

## 2016-04-18 DIAGNOSIS — C3491 Malignant neoplasm of unspecified part of right bronchus or lung: Secondary | ICD-10-CM | POA: Insufficient documentation

## 2016-04-18 LAB — POCT URINALYSIS DIPSTICK
BILIRUBIN UA: NEGATIVE
Blood, UA: NEGATIVE
GLUCOSE UA: NEGATIVE
Ketones, UA: NEGATIVE
LEUKOCYTES UA: NEGATIVE
Nitrite, UA: NEGATIVE
Protein, UA: NEGATIVE
Spec Grav, UA: 1.01
Urobilinogen, UA: 0.2
pH, UA: 6.5

## 2016-04-18 LAB — POCT UA - MICROALBUMIN: MICROALBUMIN (UR) POC: 50 mg/L

## 2016-04-18 MED ORDER — OMEPRAZOLE 40 MG PO CPDR: 40.0000 mg | DELAYED_RELEASE_CAPSULE | Freq: Two times a day (BID) | ORAL | Status: DC

## 2016-04-18 NOTE — Progress Notes (Signed)
Patient ID: Howard [name], male   DOB: 24-Feb-1931, 82 y.o.   MRN: [id number] Patient: Howard [name], Male    DOB: May 30, 1931, 82 y.o.   MRN: [id number] Visit Date: 04/18/2016  Today's Provider: [name], PA   Chief Complaint  Patient presents with  . Annual Exam   Subjective:    Annual wellness visit Howard [name] is a 82 y.o. male who presents today for his Subsequent Annual Wellness Visit. He feels fairly well. He reports exercising 3 times per week. He reports he is sleeping average 8 hours per night.  ----------------------------------------------------------- Tdap: 09/07/2011 Zoster: 11/22/2013  Review of Systems  Constitutional: Negative.   HENT: Negative.   Eyes: Negative.   Respiratory: Negative.   Cardiovascular: Negative.   Gastrointestinal: Positive for diarrhea.  Endocrine: Negative.   Genitourinary: Positive for enuresis.  Musculoskeletal: Positive for back pain.  Skin: Negative.   Allergic/Immunologic: Negative.   Neurological: Negative.   Hematological: Negative.   Psychiatric/Behavioral: Negative.     Social History   Social History  . Marital Status: Married    Spouse Name: N/A  . Number of Children: 2  . Years of Education: College   Occupational History  . Retired    Social History Main Topics  . Smoking status: Former Smoker -- 4 years  . Smokeless tobacco: Never Used     Comment: Quit 2011  . Alcohol Use: 0.6 oz/week    1 Cans of beer, 0 Standard drinks or equivalent per week     Comment: occasional  . Drug Use: No  . Sexual Activity: Not on file   Other Topics Concern  . Not on file   Social History Narrative   Drinks 2 cups of coffee a day     Patient Active Problem List   Diagnosis Date Noted  . CAD (coronary artery disease) 04/01/2016  . Adenocarcinoma ([place]) 04/01/2016  . Skin cyst 12/26/2015  . GERD (gastroesophageal reflux disease) 06/16/2015  . Abscess of back 06/06/2015  . Vertigo 03/27/2015  . Carpal tunnel  syndrome 10/28/2014  . Status post cholecystectomy 09/29/2014  . Disease of digestive tract 09/29/2014  . Nonischemic cardiomyopathy ([place]) 09/26/2014  . Cardiomyopathy ([place]) 09/26/2014  . Diabetes mellitus without complication ([place])   . Sleep apnea   . Spinal stenosis, lumbar region, with neurogenic claudication 06/07/2014  . Lumbar stenosis with neurogenic claudication 06/07/2014  . Abnormal gait 08/20/2012  . H/O total knee replacement 08/20/2012  . Arthritis of knee, degenerative 08/20/2012  . Pacemaker-St.Jude 08/03/2012  . Cardiac conduction disorder 06/19/2012  . Acid reflux 06/18/2012  . 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 Triggering of digit 03/25/2012  . Hyperlipidemia 12/23/2011  . Essential hypertension 03/23/2011  . Complete heart block ([place]) 03/23/2011  . Complete atrioventricular block ([place]) 03/23/2011    Past Surgical History  Procedure Laterality Date  . Ruptured disc      1962 and 1998  . Cervical fusion    . Knee surgery      left knee 1991 and 1992; right knee 1995  . Prostate surgery      cancer--1998  . Replacement total knee      2004  . Hand surgery      right 1993; left 2005  . Back surgery      2011  . Pacemaker insertion      PPM-- St Jude 11/30/10 by [name]  . Catheter ablation  1991    for WPW  . Joint replacement Left 2013  knee  . Joint replacement Right 2004    knee  . Lumbar laminectomy/decompression microdiscectomy N/A 06/07/2014    Procedure: LUMBAR FOUR TO FIVE LUMBAR LAMINECTOMY/DECOMPRESSION MICRODISCECTOMY 1 LEVEL;  Surgeon: [name], MD;  Location: [facility];  Service: Neurosurgery;  Laterality: N/A;  . Insert / replace / remove pacemaker    . Cardiac catheterization  08/26/2014    Single vessel obstructive CAD  . Left heart catheterization with coronary angiogram N/A 08/26/2014    Procedure: LEFT HEART CATHETERIZATION WITH CORONARY ANGIOGRAM;  Surgeon: [name], MD;  Location: [facility] CATH LAB;  Service: Cardiovascular;   Laterality: N/A;  . Cataract extraction  07-31-11 and 09-18-11  . Hernia repair  1955  . Cholecystectomy  09-07-14  . Trigger finger release  01-24-15  . Carpal tunnel release  04-04-15    [facility]  . Wound debridement Left 06/07/2015    Procedure: DEBRIDEMENT WOUND;  Surgeon: Robert Bellow, MD;  Location: [facility] ORS;  Service: General;  Laterality: Left;  left upper back  . Application of wound vac Left 06/07/2015    Procedure: APPLICATION OF WOUND VAC;  Surgeon: Robert Bellow, MD;  Location: [facility] ORS;  Service: General;  Laterality: Left;  left upper back  . Lung biopsy Right 2016    Dr [name]    His family history includes Heart attack in his mother; Hyperlipidemia in his mother.    Previous Medications   AMLODIPINE (NORVASC) 5 MG TABLET    TAKE 1 TABLET DAILY   ASPIRIN 81 MG TABLET    Take 81 mg by mouth daily.     CHOLECALCIFEROL (VITAMIN D3) 1000 UNITS CAPS    Take 1,000 Units by mouth daily.    COLESEVELAM HCL (WELCHOL) 3.75 G PACK    Take 3.75 g by mouth daily. Mix in [place] of water and drink.   HYDROCHLOROTHIAZIDE (HYDRODIURIL) 25 MG TABLET    TAKE 1 TABLET DAILY   LISINOPRIL (PRINIVIL,ZESTRIL) 10 MG TABLET    TAKE 1 TABLET DAILY   METFORMIN (GLUCOPHAGE) 500 MG TABLET    TAKE 1 TABLET TWICE A DAY   METOPROLOL SUCCINATE (TOPROL-XL) 25 MG 24 HR TABLET    TAKE 1 TABLET DAILY   MULTIPLE VITAMIN (MULTIVITAMIN) CAPSULE    Take 1 capsule by mouth daily.     OMEPRAZOLE (PRILOSEC) 40 MG CAPSULE    Take 40 mg by mouth 2 (two) times daily.   SIMVASTATIN (ZOCOR) 40 MG TABLET    Take 1 tablet (40 mg total) by mouth at bedtime.   Allergies  Allergen Reactions  . Sulfonamide Derivatives Rash     Patient Care Team: Margo Common, PA as PCP - General (Family Medicine) Robert Bellow, MD (General Surgery) Regino Schultze, MD as Referring Physician (Dermatology) [name], MD as Consulting Physician (Cardiology)     Objective:   Vitals: BP 104/68 mmHg  Pulse 70  Temp(Src) 98.1  F (36.7 C) (Oral)  Resp 16  Wt 193 lb 12.8 oz (87.907 kg)  Physical Exam  Constitutional: He is oriented to person, place, and time. He appears well-developed and well-nourished.  HENT:  Head: Normocephalic and atraumatic.  Right Ear: External ear normal.  Left Ear: External ear normal.  Nose: Nose normal.  Mouth/Throat: Oropharynx is clear and moist.  Eyes: Conjunctivae and EOM are normal. Pupils are equal, round, and reactive to light. Right eye exhibits no discharge.  Neck: Normal range of motion. Neck supple. No tracheal deviation present. No  thyromegaly present.  Cardiovascular: Normal rate, regular rhythm, normal heart sounds and intact distal pulses.   No murmur heard. Pulmonary/Chest: Effort normal and breath sounds normal. No respiratory distress. He has no wheezes. He has no rales. He exhibits no tenderness.  Pacemaker in the left upper chest.  Abdominal: Soft. He exhibits no distension and no mass. There is no tenderness. There is no rebound and no guarding.  Genitourinary:  History of prostatectomy secondary to cancer in 1998.  Musculoskeletal: Normal range of motion. He exhibits no edema or tenderness.  Large scars both knees from past joint replacements.  Lymphadenopathy:    He has no cervical adenopathy.  Neurological: He is alert and oriented to person, place, and time. He has normal reflexes. No cranial nerve deficit. He exhibits normal muscle tone. Coordination normal.  Skin: Skin is warm and dry. No rash noted. No erythema.  Psychiatric: He has a normal mood and affect. His behavior is normal. Judgment and thought content normal.    Activities of Daily Living In your present state of health, do you have any difficulty performing the following activities: 06/07/2015  Hearing? Y  Vision? N  Difficulty concentrating or making decisions? N  Walking or climbing stairs? Y  Dressing or bathing? N    Fall Risk Assessment Fall Risk  01/15/2016 12/15/2015  Falls in  the past year? No Yes  Number falls in past yr: - 1  Injury with Fall? - Yes     Depression Screen PHQ 2/9 Scores 01/15/2016  PHQ - 2 Score 0    Cognitive Testing - 6-CIT  Correct? Score   What year is it? yes 0 0 or 4  What month is it? yes 0 0 or 3  Memorize:    Pia Mau,  44,  [address]  [place],      What time is it? (within 1 hour) yes 0 0 or 3  Count backwards from 20 yes 0 0, 2, or 4  Name the months of the year yes 0 0, 2, or 4  Repeat name & address above no 2 0, 2, 4, 6, 8, or 10       TOTAL SCORE  2/28   Interpretation:  Normal  Normal (0-7) Abnormal (8-28)    Assessment & Plan:     Annual Wellness Visit  Reviewed patient's Family Medical History Reviewed and updated list of patient's medical providers Assessment of cognitive impairment was done Assessed patient's functional ability Established a written schedule for health screening [place] Completed and Reviewed  Exercise Activities and Dietary recommendations Goals    Goes to [facility] three days a week. Still trying to follow low fat diabetic diet.      Immunization History  Administered Date(s) Administered  . Influenza, High Dose Seasonal PF 09/15/2015  . Influenza-Unspecified 08/23/2013  . Tdap 09/07/2011  . Zoster 11/22/2013    Health Maintenance  Topic Date Due  . PNA vac Low Risk Adult (1 of 2 - PCV13) 04/24/1998  . HEMOGLOBIN A1C  03/17/2016  . INFLUENZA VACCINE  07/23/2016  . OPHTHALMOLOGY EXAM  12/31/2016  . FOOT EXAM  04/18/2017  . TETANUS/TDAP  09/06/2021  . ZOSTAVAX  Completed      Discussed health benefits of physical activity, and encouraged him to engage in regular exercise appropriate for his age and condition.    ------------------------------------------------------------------------------------------------------------ 1. Annual physical exam Stable without specific complaint today. Immunizations up to date except getting Prevnar today.  -  POCT urinalysis dipstick  2. H/O total knee replacement, bilateral Well healed without discomfort. Able to walk better since surgery 2004 on the right and 2013 on the left.  3. Hyperlipidemia No myalgias with use of Welchol and Simvastatin. Continue low fat diet. Recurrent numbness in thighs since having lumbar and cervical laminectomies with cervical fusion in 1962, 1998, 2011 and 2015, limits ability to exercise. - Lipid panel - TSH  4. Diabetes mellitus without complication ([place]) Normal eye exam in January 2017 and normal sensation in feet to test with nylon string today. Tolerating Welchol and Metformin with diabetic diet. Continue present regimen and recheck labs. Urine microalbumin was 50 today. Continue Lisinopril.  - CBC with Differential/Platelet - Comprehensive metabolic panel - Hemoglobin A1c  5. Gastroesophageal reflux disease with esophagitis History of hiatal hernia, multiple gastric polyps and esophagitis per upper endoscopy by Dr. Vira Agar 01-03-12. Dyspepsia returns in 2 days whenever he tries to go without the Omeprazole. Will refill and recheck routine labs. - omeprazole (PRILOSEC) 40 MG capsule; Take 1 capsule (40 mg total) by mouth 2 (two) times daily.  Dispense: 180 capsule; Refill: 3  6. Essential hypertension Tolerating Amlodipine, HCTZ, Metoprolol and Lisinopril without hypotensive episodes, dizziness or muscle cramps. Continue present low sodium diet and recheck labs. - CBC with Differential/Platelet - Comprehensive metabolic panel - TSH  7. Cardiomyopathy ([place]) Followed by Dr. [name] and monitoring pacemaker activity. Feeling well without palpitations or chest pains. Pacemaker put in place for complete heart block in 2011.  8. Malignant neoplasm of middle lobe of right lung ([place]) Treated with radiation in 2016. Had follow up CT scan 04-10-16 by oncologist. States lesion is no longer present. Continue follow up with oncologist in 6 months.  9. Adenocarcinoma  ([place]) History of prostatectomy in 1998 due to prostate cancer. Having trouble with bladder control more frequently recently. Probably needs urology re-evaluation.  10. Need for Streptococcus pneumoniae vaccination Given prevnar 13 and had pneumovax in 2002.

## 2016-04-19 DIAGNOSIS — Z8546 Personal history of malignant neoplasm of prostate: Secondary | ICD-10-CM | POA: Diagnosis not present

## 2016-04-19 DIAGNOSIS — E785 Hyperlipidemia, unspecified: Secondary | ICD-10-CM | POA: Diagnosis not present

## 2016-04-19 DIAGNOSIS — E119 Type 2 diabetes mellitus without complications: Secondary | ICD-10-CM | POA: Diagnosis not present

## 2016-04-19 DIAGNOSIS — I10 Essential (primary) hypertension: Secondary | ICD-10-CM | POA: Diagnosis not present

## 2016-04-20 LAB — LIPID PANEL
CHOL/HDL RATIO: 3.1 ratio (ref 0.0–5.0)
Cholesterol, Total: 147 mg/dL (ref 100–199)
HDL: 48 mg/dL (ref >39)
LDL CALC: 72 mg/dL (ref 0–99)
Triglycerides: 133 mg/dL (ref 0–149)
VLDL CHOLESTEROL CAL: 27 mg/dL (ref 5–40)

## 2016-04-20 LAB — CBC WITH DIFFERENTIAL/PLATELET
BASOS: 1 %
Basophils Absolute: 0 10*3/uL (ref 0.0–0.2)
EOS (ABSOLUTE): 0.5 10*3/uL — AB (ref 0.0–0.4)
EOS: 9 %
HEMATOCRIT: 43.5 % (ref 37.5–51.0)
HEMOGLOBIN: 13.9 g/dL (ref 12.6–17.7)
IMMATURE GRANS (ABS): 0 10*3/uL (ref 0.0–0.1)
IMMATURE GRANULOCYTES: 0 %
LYMPHS: 30 %
Lymphocytes Absolute: 1.6 10*3/uL (ref 0.7–3.1)
MCH: 25.2 pg — AB (ref 26.6–33.0)
MCHC: 32 g/dL (ref 31.5–35.7)
MCV: 79 fL (ref 79–97)
MONOCYTES: 12 %
Monocytes Absolute: 0.6 10*3/uL (ref 0.1–0.9)
NEUTROS ABS: 2.6 10*3/uL (ref 1.4–7.0)
NEUTROS PCT: 48 %
PLATELETS: 233 10*3/uL (ref 150–379)
RBC: 5.52 x10E6/uL (ref 4.14–5.80)
RDW: 17 % — ABNORMAL HIGH (ref 12.3–15.4)
WBC: 5.3 10*3/uL (ref 3.4–10.8)

## 2016-04-20 LAB — COMPREHENSIVE METABOLIC PANEL
A/G RATIO: 1.9 (ref 1.2–2.2)
ALT: 23 IU/L (ref 0–44)
AST: 25 IU/L (ref 0–40)
Albumin: 4.5 g/dL (ref 3.5–4.7)
Alkaline Phosphatase: 61 IU/L (ref 39–117)
BILIRUBIN TOTAL: 0.5 mg/dL (ref 0.0–1.2)
BUN/Creatinine Ratio: 20 (ref 10–24)
BUN: 17 mg/dL (ref 8–27)
CALCIUM: 10.9 mg/dL — AB (ref 8.6–10.2)
CHLORIDE: 94 mmol/L — AB (ref 96–106)
CO2: 23 mmol/L (ref 18–29)
Creatinine, Ser: 0.86 mg/dL (ref 0.76–1.27)
GFR, EST AFRICAN AMERICAN: 93 mL/min/{1.73_m2} (ref >59)
GFR, EST NON AFRICAN AMERICAN: 81 mL/min/{1.73_m2} (ref >59)
GLOBULIN, TOTAL: 2.4 g/dL (ref 1.5–4.5)
Glucose: 128 mg/dL — ABNORMAL HIGH (ref 65–99)
POTASSIUM: 4.7 mmol/L (ref 3.5–5.2)
SODIUM: 136 mmol/L (ref 134–144)
TOTAL PROTEIN: 6.9 g/dL (ref 6.0–8.5)

## 2016-04-20 LAB — HEMOGLOBIN A1C
Est. average glucose Bld gHb Est-mCnc: 169 mg/dL
Hgb A1c MFr Bld: 7.5 % — ABNORMAL HIGH (ref 4.8–5.6)

## 2016-04-20 LAB — TSH: TSH: 1.78 u[IU]/mL (ref 0.450–4.500)

## 2016-04-22 ENCOUNTER — Telehealth: Payer: Self-pay

## 2016-04-22 NOTE — Telephone Encounter (Signed)
-----   Message from Margo Common, [place] sent at 04/22/2016  8:33 AM EDT ----- No sign of anemia. Normal kidney and liver function tests. Calcium slightly up - probably due to lung cancer issues. Blood sugar and Hgb A1C up a little. Take Metformin BID regularly and watch diet closely. Very good cholesterol and triglyceride levels. Recheck diabetes in 3 months.

## 2016-04-22 NOTE — Telephone Encounter (Signed)
PSA has been "0" since 2012. This should continue to be negative since he had a prostatectomy for prostate cancer in 1998. Will ask lab to run PSA to be sure no problems.

## 2016-04-22 NOTE — Telephone Encounter (Signed)
Patient's wife [name] advised as directed below (consent in chart).  [name] states patient was suppose to have PSA checked also. Patient advise.

## 2016-04-23 ENCOUNTER — Ambulatory Visit: Payer: Medicare Other | Attending: Physician Assistant | Admitting: Occupational Therapy

## 2016-04-23 ENCOUNTER — Other Ambulatory Visit: Payer: Self-pay

## 2016-04-23 DIAGNOSIS — M25642 Stiffness of left hand, not elsewhere classified: Secondary | ICD-10-CM | POA: Insufficient documentation

## 2016-04-23 DIAGNOSIS — M79642 Pain in left hand: Secondary | ICD-10-CM | POA: Diagnosis not present

## 2016-04-23 NOTE — Patient Instructions (Signed)
Pt to see MD on Friday - cont to use and prevent triggering and increase pain

## 2016-04-23 NOTE — Telephone Encounter (Signed)
[place] to add test, awaiting confirmation.

## 2016-04-23 NOTE — Therapy (Signed)
[facility] [address] [place], [address] Phone: [phone]   Fax:  [phone]  Occupational Therapy Treatment  Patient Details  Name: Howard [name] MRN: [id number] Date of Birth: 1931-04-25 Referring Provider: Loman Brooklyn  Encounter Date: 04/23/2016      OT End of Session - 04/23/16 1153    Visit Number 3   Number of Visits 3   Date for OT Re-Evaluation 04/23/16   OT Start Time 1005   OT Stop Time 1021   OT Time Calculation (min) 16 min   Activity Tolerance Patient tolerated treatment well   Behavior During Therapy [facility] for tasks assessed/performed      Past Medical History  Diagnosis Date  . Hyperlipidemia   . Hypertension   . Complete heart block ([place])     a. 11/2010 s/p SJM 2210 Accent DC PPM, ser# [id number].  . WPW (Wolff-Parkinson-White syndrome)     a. S/P RFCA 1991.  . Sleep apnea     a. cpap  . Diabetes mellitus without complication ([place])   . LBBB (left bundle branch block)   . GERD (gastroesophageal reflux disease)   . Arthritis   . NICM (nonischemic cardiomyopathy) ([place])     a. 07/2014 Echo: EF 35-40%, mid-apicalanteroseptal DK, Gr 1 DD, mild-mod dil LA.  Marland Kitchen Chronic combined systolic and diastolic CHF, NYHA class 1 ([place])     a. 07/2014 Echo: EF 35-40%, Gr 1 DD.  Marland Kitchen Vertigo   . Depression   . Non-obstructive CAD     a. 07/2014 Abnl MV;  b. 08/2014 Cath: LM nl, LAD 30p, RI 40p, LCX nl, OM1 40, RCA dominant 30p, 70d-->Med Rx.  . Left-sided Bell's palsy   . Poor balance   . Fall 11-10-14  . Presence of permanent cardiac pacemaker   . Bell palsy   . Cancer [facility])     prostate and skin  . Lung cancer ([place]) 2016    Past Surgical History  Procedure Laterality Date  . Ruptured disc      1962 and 1998  . Cervical fusion    . Knee surgery      left knee 1991 and 1992; right knee 1995  . Prostate surgery      cancer--1998  . Replacement total knee      2004  . Hand surgery      right  1993; left 2005  . Back surgery      2011  . Pacemaker insertion      PPM-- St Jude 11/30/10 by [name]  . Catheter ablation  1991    for WPW  . Joint replacement Left 2013    knee  . Joint replacement Right 2004    knee  . Lumbar laminectomy/decompression microdiscectomy N/A 06/07/2014    Procedure: LUMBAR FOUR TO FIVE LUMBAR LAMINECTOMY/DECOMPRESSION MICRODISCECTOMY 1 LEVEL;  Surgeon: [name], MD;  Location: [place] NEURO ORS;  Service: Neurosurgery;  Laterality: N/A;  . Insert / replace / remove pacemaker    . Cardiac catheterization  08/26/2014    Single vessel obstructive CAD  . Left heart catheterization with coronary angiogram N/A 08/26/2014    Procedure: LEFT HEART CATHETERIZATION WITH CORONARY ANGIOGRAM;  Surgeon: [name], MD;  Location: [facility] CATH LAB;  Service: Cardiovascular;  Laterality: N/A;  . Cataract extraction  07-31-11 and 09-18-11  . Hernia repair  1955  . Cholecystectomy  09-07-14  . Trigger finger release  01-24-15  . Carpal tunnel  release  04-04-15    [facility]  . Wound debridement Left 06/07/2015    Procedure: DEBRIDEMENT WOUND;  Surgeon: Robert Bellow, MD;  Location: [facility] ORS;  Service: General;  Laterality: Left;  left upper back  . Application of wound vac Left 06/07/2015    Procedure: APPLICATION OF WOUND VAC;  Surgeon: Robert Bellow, MD;  Location: [facility] ORS;  Service: General;  Laterality: Left;  left upper back  . Lung biopsy Right 2016    Dr [name]    There were no vitals filed for this visit.      Subjective Assessment - 04/23/16 1151    Subjective  IF I work it - my thumb hurts more - but If I leave it alone I can use it in everything - only hurts when I jam my finger pushing it into object - do not lock when using it - and pain better    Patient Stated Goals Want the tip of my thumb to bend - that I can do my buttons maybe with more ease , repair clocks with more ease, pick up small objects   Currently in Pain? No/denies            [facility] OT Assessment  - 04/23/16 0001    Strength   Right Hand Grip (lbs) 40   Right Hand Lateral Pinch 14 lbs   Right Hand 3 Point Pinch 10 lbs   Left Hand Grip (lbs) 36   Left Hand Lateral Pinch 10 lbs   Left Hand 3 Point Pinch 11 lbs   Left Hand AROM   L Thumb MCP 0-60 55 Degrees   L Thumb IP 0-80 30 Degrees      Grip and 3 point in L hand improve - pain better  With there ex , increase IP flexion - pt locked at IP in 50-55 - and unable to actively extend   pt to see MD Friday                       OT Education - 04/23/16 1153    Education provided Yes   Education Details HEP   Person(s) Educated Patient   Methods Explanation;Demonstration;Tactile cues;Verbal cues   Comprehension Returned demonstration;Verbalized understanding;Verbal cues required          OT Short Term Goals - 04/23/16 1155    OT SHORT TERM GOAL #1   Title L thumb IP flexion improve with 20 degrees to report increase ease with buttons or picking up small objects   Baseline IP 30 , when increase to 50 last week - pt locked at 50-55 - could not Actively onlock   Status Not Met           OT Long Term Goals - 04/23/16 1156    OT LONG TERM GOAL #1   Title Pt to be ind in HEP to increase L IP flexion , increase use and decreaes pain at A1pulley at volar MP    Baseline pain improve but ROM same - and locked if had increase ROM    Status Partially Met               Plan - 04/23/16 1154    Clinical Impression Statement Assess AROM at thumb and grip/prehension - grip and 3 point increase - AROM about the same - pain better - pt to see MD Friday - to discuss Plan    OT Treatment/Interventions Self-care/ADL training;Patient/family education;Manual Therapy;Parrafin;Ultrasound;Passive range of  motion   Plan Pt appt with MD Friday    OT Home Exercise Plan see pt instruction    Consulted and Agree with Plan of Care Patient      Patient will benefit from skilled therapeutic intervention in order to  improve the following deficits and impairments:     Visit Diagnosis: Stiffness of left hand, not elsewhere classified  Pain in left hand    Problem List Patient Active Problem List   Diagnosis Date Noted  . Malignant neoplasm of right lung ([place]) 04/18/2016  . CAD (coronary artery disease) 04/01/2016  . Adenocarcinoma ([place]) 04/01/2016  . Skin cyst 12/26/2015  . GERD (gastroesophageal reflux disease) 06/16/2015  . Abscess of back 06/06/2015  . Vertigo 03/27/2015  . Carpal tunnel syndrome 10/28/2014  . Status post cholecystectomy 09/29/2014  . Disease of digestive tract 09/29/2014  . Nonischemic cardiomyopathy ([place]) 09/26/2014  . Cardiomyopathy ([place]) 09/26/2014  . Diabetes mellitus without complication ([place])   . Sleep apnea   . Spinal stenosis, lumbar region, with neurogenic claudication 06/07/2014  . Lumbar stenosis with neurogenic claudication 06/07/2014  . Abnormal gait 08/20/2012  . H/O total knee replacement 08/20/2012  . Arthritis of knee, degenerative 08/20/2012  . Pacemaker-St.Jude 08/03/2012  . Cardiac conduction disorder 06/19/2012  . Acid reflux 06/18/2012  . 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 Triggering of digit 03/25/2012  . Hyperlipidemia 12/23/2011  . Essential hypertension 03/23/2011  . Complete heart block ([place]) 03/23/2011  . Complete atrioventricular block ([place]) 03/23/2011    Akiah Bauch OTR/L,CLT  04/23/2016, 11:57 AM  [facility] [address] [place], [address] Phone: [phone]   Fax:  [phone]  Name: TEREK BEE MRN: [id number] Date of Birth: [birth year]

## 2016-04-24 LAB — SPECIMEN STATUS REPORT

## 2016-04-24 LAB — PSA: Prostate Specific Ag, Serum: <0.1 ng/mL (ref 0.0–4.0)

## 2016-04-29 NOTE — Telephone Encounter (Signed)
Patient's wife advised as directed below. Wife states patient is still having incontinence issues. Patient scheduled for a OV on 05/02/2016.

## 2016-04-29 NOTE — Telephone Encounter (Signed)
-----   Message from Margo Common, [place] sent at 04/29/2016  8:31 AM EDT ----- PSA undetectable (as expected after having prostatectomy).

## 2016-04-30 ENCOUNTER — Ambulatory Visit: Payer: Medicare Other | Admitting: Occupational Therapy

## 2016-05-02 ENCOUNTER — Encounter: Payer: Self-pay | Admitting: Family Medicine

## 2016-05-02 ENCOUNTER — Ambulatory Visit (INDEPENDENT_AMBULATORY_CARE_PROVIDER_SITE_OTHER): Payer: Medicare Other | Admitting: Family Medicine

## 2016-05-02 VITALS — BP 102/64 | HR 71 | Temp 98.1°F | Resp 16 | Wt 195.4 lb

## 2016-05-02 DIAGNOSIS — Z9079 Acquired absence of other genital organ(s): Secondary | ICD-10-CM | POA: Diagnosis not present

## 2016-05-02 DIAGNOSIS — I2510 Atherosclerotic heart disease of native coronary artery without angina pectoris: Secondary | ICD-10-CM | POA: Diagnosis not present

## 2016-05-02 DIAGNOSIS — N3946 Mixed incontinence: Secondary | ICD-10-CM | POA: Diagnosis not present

## 2016-05-02 NOTE — Progress Notes (Signed)
Patient ID: Howard [name], male   DOB: [birth year], 82 y.o.   MRN: [id number]   Patient: Howard [name] Male    DOB: Apr 05, 1931   82 y.o.   MRN: [id number] Visit Date: 05/02/2016  Today's Provider: [name], PA   Chief Complaint  Patient presents with  . Urinary Incontinence   Subjective:    HPI Urinary Incontinence:  Patient reports urinary incontinence the past few years, but incontinence has worsen. Patient reports he currently uses pads, and on occasions soils the pad without knowledge. History of back surgeries (1962, 1998,2011 and 2015) and prostatectomy for adenocarcinoma in 1998. Started having some dribbling the past few years. Working in the yard and had an overflow issues 1 month ago. Usually changes pads 3 times a day with them being "halfway soaked".  Past Medical History  Diagnosis Date  . Hyperlipidemia   . Hypertension   . Complete heart block ([place])     a. 11/2010 s/p SJM 2210 Accent DC PPM, ser# [id number].  . WPW (Wolff-Parkinson-White syndrome)     a. S/P RFCA 1991.  . Sleep apnea     a. cpap  . Diabetes mellitus without complication ([place])   . LBBB (left bundle branch block)   . GERD (gastroesophageal reflux disease)   . Arthritis   . NICM (nonischemic cardiomyopathy) ([place])     a. 07/2014 Echo: EF 35-40%, mid-apicalanteroseptal DK, Gr 1 DD, mild-mod dil LA.  Marland Kitchen Chronic combined systolic and diastolic CHF, NYHA class 1 ([place])     a. 07/2014 Echo: EF 35-40%, Gr 1 DD.  Marland Kitchen Vertigo   . Depression   . Non-obstructive CAD     a. 07/2014 Abnl MV;  b. 08/2014 Cath: LM nl, LAD 30p, RI 40p, LCX nl, OM1 40, RCA dominant 30p, 70d-->Med Rx.  . Left-sided Bell's palsy   . Poor balance   . Fall 11-10-14  . Presence of permanent cardiac pacemaker   . Bell palsy   . Cancer [facility])     prostate and skin  . Lung cancer ([place]) 2016   Past Surgical History  Procedure Laterality Date  . Ruptured disc      1962 and 1998  . Cervical fusion    . Knee surgery      left knee 1991 and  1992; right knee 1995  . Prostate surgery      cancer--1998  . Replacement total knee      2004  . Hand surgery      right 1993; left 2005  . Back surgery      2011  . Pacemaker insertion      PPM-- St Jude 11/30/10 by [name]  . Catheter ablation  1991    for WPW  . Joint replacement Left 2013    knee  . Joint replacement Right 2004    knee  . Lumbar laminectomy/decompression microdiscectomy N/A 06/07/2014    Procedure: LUMBAR FOUR TO FIVE LUMBAR LAMINECTOMY/DECOMPRESSION MICRODISCECTOMY 1 LEVEL;  Surgeon: [name], MD;  Location: [place] NEURO ORS;  Service: Neurosurgery;  Laterality: N/A;  . Insert / replace / remove pacemaker    . Cardiac catheterization  08/26/2014    Single vessel obstructive CAD  . Left heart catheterization with coronary angiogram N/A 08/26/2014    Procedure: LEFT HEART CATHETERIZATION WITH CORONARY ANGIOGRAM;  Surgeon: [name], MD;  Location: [facility] CATH LAB;  Service: Cardiovascular;  Laterality: N/A;  . Cataract extraction  07-31-11 and 09-18-11  . Hernia repair  1955  . Cholecystectomy  09-07-14  . Trigger finger release  01-24-15  . Carpal tunnel release  04-04-15    [facility]  . Wound debridement Left 06/07/2015    Procedure: DEBRIDEMENT WOUND;  Surgeon: Robert Bellow, MD;  Location: [facility] ORS;  Service: General;  Laterality: Left;  left upper back  . Application of wound vac Left 06/07/2015    Procedure: APPLICATION OF WOUND VAC;  Surgeon: Robert Bellow, MD;  Location: [facility] ORS;  Service: General;  Laterality: Left;  left upper back  . Lung biopsy Right 2016    Dr [name]   Family History  Problem Relation Age of Onset  . CAD    . Heart attack Mother   . Hyperlipidemia Mother    Previous Medications   AMLODIPINE (NORVASC) 5 MG TABLET    TAKE 1 TABLET DAILY   ASPIRIN 81 MG TABLET    Take 81 mg by mouth daily.     CHOLECALCIFEROL (VITAMIN D3) 1000 UNITS CAPS    Take 1,000 Units by mouth daily.    COLESEVELAM HCL (WELCHOL) 3.75 G PACK    Take 3.75 g by mouth  daily. Mix in [place] of water and drink.   HYDROCHLOROTHIAZIDE (HYDRODIURIL) 25 MG TABLET    TAKE 1 TABLET DAILY   LISINOPRIL (PRINIVIL,ZESTRIL) 10 MG TABLET    TAKE 1 TABLET DAILY   METFORMIN (GLUCOPHAGE) 500 MG TABLET    TAKE 1 TABLET TWICE A DAY   METOPROLOL SUCCINATE (TOPROL-XL) 25 MG 24 HR TABLET    TAKE 1 TABLET DAILY   MULTIPLE VITAMIN (MULTIVITAMIN) CAPSULE    Take 1 capsule by mouth daily.     OMEPRAZOLE (PRILOSEC) 40 MG CAPSULE    Take 1 capsule (40 mg total) by mouth 2 (two) times daily.   SIMVASTATIN (ZOCOR) 40 MG TABLET    Take 1 tablet (40 mg total) by mouth at bedtime.   Allergies  Allergen Reactions  . Sulfonamide Derivatives Rash    Review of Systems  Constitutional: Negative.   HENT: Negative.   Eyes: Negative.   Respiratory: Negative.   Cardiovascular: Negative.   Gastrointestinal: Negative.   Endocrine: Negative.   Genitourinary: Negative.        Urinary incontinence   Musculoskeletal: Positive for neck stiffness.  Skin: Negative.   Allergic/Immunologic: Negative.   Neurological: Negative.   Hematological: Negative.   Psychiatric/Behavioral: Negative.     Social History  Substance Use Topics  . Smoking status: Former Smoker -- 4 years  . Smokeless tobacco: Never Used     Comment: Quit 2011  . Alcohol Use: 0.6 oz/week    1 Cans of beer, 0 Standard drinks or equivalent per week     Comment: occasional   Objective:   BP 102/64 mmHg  Pulse 71  Temp(Src) 98.1 F (36.7 C) (Oral)  Resp 16  Wt 195 lb 6.4 oz (88.633 kg)  Physical Exam  Constitutional: He is oriented to person, place, and time. He appears well-developed and well-nourished. No distress.  HENT:  Head: Normocephalic and atraumatic.  Right Ear: Hearing normal.  Left Ear: Hearing normal.  Nose: Nose normal.  Eyes: Conjunctivae and lids are normal. Right eye exhibits no discharge. Left eye exhibits no discharge. No scleral icterus.  Pulmonary/Chest: Effort normal. No respiratory distress.    Genitourinary:  No masses felt on DRE. No skin irritation from incontinence.   Musculoskeletal: Normal range of motion.  Neurological: He is alert and oriented to person, place, and time.  Skin: Skin is intact. No lesion and no rash noted.  Psychiatric: He has a normal mood and affect. His speech is normal and behavior is normal. Thought content normal.      Assessment & Plan:      1. Mixed incontinence Increase in dribbling with episode of full incontinence 1 month ago while working in the yard. Some urge and stress incontinence noted. No hematuria. Will schedule referral to urologist. - Ambulatory referral to Urology  2. Hx of prostatectomy Had prostate cancer and prostatectomy in 1998. Some urinary leakage since that surgery and laminectomies (1962, 1998, 2011 and 2015). Schedule urology referral. - Ambulatory referral to Urology

## 2016-05-07 ENCOUNTER — Encounter: Payer: Self-pay | Admitting: Cardiology

## 2016-05-07 ENCOUNTER — Ambulatory Visit (INDEPENDENT_AMBULATORY_CARE_PROVIDER_SITE_OTHER): Payer: Medicare Other | Admitting: Urology

## 2016-05-07 ENCOUNTER — Encounter: Payer: Self-pay | Admitting: Urology

## 2016-05-07 VITALS — BP 116/74 | HR 74 | Ht 69.0 in | Wt 192.4 lb

## 2016-05-07 DIAGNOSIS — N3946 Mixed incontinence: Secondary | ICD-10-CM | POA: Diagnosis not present

## 2016-05-07 DIAGNOSIS — I2510 Atherosclerotic heart disease of native coronary artery without angina pectoris: Secondary | ICD-10-CM

## 2016-05-07 LAB — CUP PACEART REMOTE DEVICE CHECK
Brady Statistic AP VP Percent: 4.6 %
Brady Statistic AP VS Percent: 1 %
Brady Statistic RA Percent Paced: 4 %
Brady Statistic RV Percent Paced: 99 %
Implantable Lead Implant Date: 20111208
Implantable Lead Location: 753859
Implantable Lead Model: 1948
Lead Channel Impedance Value: 360 Ohm
Lead Channel Impedance Value: 440 Ohm
Lead Channel Sensing Intrinsic Amplitude: 3.1 mV
MDC IDC LEAD IMPLANT DT: 20111208
MDC IDC LEAD LOCATION: 753860
MDC IDC MSMT BATTERY REMAINING LONGEVITY: 52 mo
MDC IDC MSMT BATTERY REMAINING PERCENTAGE: 57 %
MDC IDC MSMT BATTERY VOLTAGE: 2.89 V
MDC IDC MSMT LEADCHNL RV SENSING INTR AMPL: 12 mV
MDC IDC SESS DTM: 20170410153720
MDC IDC SET LEADCHNL RA PACING AMPLITUDE: 2 V
MDC IDC SET LEADCHNL RV PACING AMPLITUDE: 2.5 V
MDC IDC SET LEADCHNL RV PACING PULSEWIDTH: 0.8 ms
MDC IDC SET LEADCHNL RV SENSING SENSITIVITY: 4 mV
MDC IDC STAT BRADY AS VP PERCENT: 95 %
MDC IDC STAT BRADY AS VS PERCENT: 1 %
Pulse Gen Model: 2210
Pulse Gen Serial Number: 7196739

## 2016-05-07 LAB — MICROSCOPIC EXAMINATION
Bacteria, UA: NONE SEEN
EPITHELIAL CELLS (NON RENAL): NONE SEEN /HPF (ref 0–10)

## 2016-05-07 LAB — URINALYSIS, COMPLETE
BILIRUBIN UA: NEGATIVE
Ketones, UA: NEGATIVE
LEUKOCYTES UA: NEGATIVE
Nitrite, UA: NEGATIVE
PH UA: 7 (ref 5.0–7.5)
Protein, UA: NEGATIVE
RBC UA: NEGATIVE
Specific Gravity, UA: 1.015 (ref 1.005–1.030)
Urobilinogen, Ur: 0.2 mg/dL (ref 0.2–1.0)

## 2016-05-07 LAB — BLADDER SCAN AMB NON-IMAGING: Scan Result: 22

## 2016-05-07 NOTE — Progress Notes (Signed)
05/07/2016 12:26 PM   [name] 12/13/1931 [id number]  Referring provider: Margo Common, [place], [place] [address]  Chief Complaint  Patient presents with  . New Patient (Initial Visit)    incontinence x 2 yrs     HPI: 82 year old male who is seen today for urinary incontinence. The patient states that his urinary incontinence began approximately 2 years ago. It has gotten worse over that time. He describes predominantly stress urinary incontinence with leakage associated with bending, laughing, sneezing, and exercise. Most of his incontinence he is unaware of until he changes his pads. He has been going through 2 pads per day. He is not using any pads at night. The pads are soaked upon changing them. The patient denies any urinary urgency or urge incontinence. He denies any progressive voiding symptoms including worsening frequency. The patient denies any dysuria or gross hematuria. He does not have a history of urinary tract infections. The patient has a strong stream, feels that he empties his bladder completely, does not have to strain to void. He denies constipation.  The patient does have a history of 5 previous back surgeries, the most recent surgery was in 2015 for lumbar laminectomy. Prior to the patient's back surgery he was having no issues with urinary incontinence. The patient denies any other neurological deficits including numbness/tingling, weakness, or fecal incontinence.the patient relates that he found his paperwork from his prostatectomy in 1998 recently and started performing the exercises as prescribed with some improvement in his symptoms. He has since stopped taking exercises and his symptoms have returned.  The patient has a history of retropubic radical prostatectomy in 1998. Quickly after the operation the patient was continent. He was not wearing any urinary pads until recently.  The patient states that he has not seen a urologist since  soon after his operation at [facility]. He has had his PSA checked by his primary care providers which always been undetectable. His most recent PSA was only a few weeks ago.    The patient has had a number of orthopedic surgeries. He otherwise has a past medical history significant for hypertension and non-insulin-dependent diabetes.  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  IPSS: 4, Q O L5 PVR: 22 mL's  PMH: Past Medical History  Diagnosis Date  . Hyperlipidemia   . Hypertension   . Complete heart block ([place])     a. 11/2010 s/p SJM 2210 Accent DC PPM, ser# [id number].  . WPW (Wolff-Parkinson-White syndrome)     a. S/P RFCA 1991.  . Sleep apnea     a. cpap  . Diabetes mellitus without complication ([place])   . LBBB (left bundle branch block)   . GERD (gastroesophageal reflux disease)   . Arthritis   . NICM (nonischemic cardiomyopathy) ([place])     a. 07/2014 Echo: EF 35-40%, mid-apicalanteroseptal DK, Gr 1 DD, mild-mod dil LA.  Marland Kitchen Chronic combined systolic and diastolic CHF, NYHA class 1 ([place])     a. 07/2014 Echo: EF 35-40%, Gr 1 DD.  Marland Kitchen Vertigo   . Depression   . Non-obstructive CAD     a. 07/2014 Abnl MV;  b. 08/2014 Cath: LM nl, LAD 30p, RI 40p, LCX nl, OM1 40, RCA dominant 30p, 70d-->Med Rx.  . Left-sided Bell's palsy   . Poor balance   . Fall 11-10-14  . Presence of permanent cardiac pacemaker   . Bell palsy   .  Cancer [facility])     prostate and skin  . Lung cancer ([place]) 2016  . History of prostate cancer     Surgical History: Past Surgical History  Procedure Laterality Date  . Ruptured disc      1962 and 1998  . Cervical fusion    . Knee surgery      left knee 1991 and 1992; right knee 1995  . Replacement total knee      2004  . Hand surgery      right 1993; left 2005  . Back surgery      2011  . Pacemaker insertion      PPM-- St Jude 11/30/10 by [name]  . Catheter ablation  1991    for WPW  . Joint replacement Left 2013    knee    . Joint replacement Right 2004    knee  . Lumbar laminectomy/decompression microdiscectomy N/A 06/07/2014    Procedure: LUMBAR FOUR TO FIVE LUMBAR LAMINECTOMY/DECOMPRESSION MICRODISCECTOMY 1 LEVEL;  Surgeon: [name], MD;  Location: [facility] ORS;  Service: Neurosurgery;  Laterality: N/A;  . Insert / replace / remove pacemaker    . Cardiac catheterization  08/26/2014    Single vessel obstructive CAD  . Left heart catheterization with coronary angiogram N/A 08/26/2014    Procedure: LEFT HEART CATHETERIZATION WITH CORONARY ANGIOGRAM;  Surgeon: [name], MD;  Location: [facility] CATH LAB;  Service: Cardiovascular;  Laterality: N/A;  . Cataract extraction  07-31-11 and 09-18-11  . Hernia repair  1955  . Cholecystectomy  09-07-14  . Trigger finger release  01-24-15  . Carpal tunnel release  04-04-15    [facility]  . Wound debridement Left 06/07/2015    Procedure: DEBRIDEMENT WOUND;  Surgeon: Robert Bellow, MD;  Location: [facility] ORS;  Service: General;  Laterality: Left;  left upper back  . Application of wound vac Left 06/07/2015    Procedure: APPLICATION OF WOUND VAC;  Surgeon: Robert Bellow, MD;  Location: [facility] ORS;  Service: General;  Laterality: Left;  left upper back  . Lung biopsy Right 2016    Dr [name]  . Prostate surgery      cancer--1998, prostatectomy    Home Medications:    Medication List       This list is accurate as of: 05/07/16 12:26 PM.  Always use your most recent med list.               amLODipine 5 MG tablet  Commonly known as:  NORVASC  TAKE 1 TABLET DAILY     aspirin 81 MG tablet  Take 81 mg by mouth daily.     hydrochlorothiazide 25 MG tablet  Commonly known as:  HYDRODIURIL  TAKE 1 TABLET DAILY     lisinopril 10 MG tablet  Commonly known as:  PRINIVIL,ZESTRIL  TAKE 1 TABLET DAILY     metFORMIN 500 MG tablet  Commonly known as:  GLUCOPHAGE  TAKE 1 TABLET TWICE A DAY     metoprolol succinate 25 MG 24 hr tablet  Commonly known as:  TOPROL-XL  TAKE 1 TABLET  DAILY     multivitamin capsule  Take 1 capsule by mouth daily.     omeprazole 40 MG capsule  Commonly known as:  PRILOSEC  Take 1 capsule (40 mg total) by mouth 2 (two) times daily.     simvastatin 40 MG tablet  Commonly known as:  ZOCOR  Take 1 tablet (40 mg total) by mouth at bedtime.  Vitamin D3 1000 units Caps  Take 1,000 Units by mouth daily.     WELCHOL 3.75 g Pack  Generic drug:  Colesevelam HCl  Take 3.75 g by mouth daily. Mix in [place] of water and drink.        Allergies:  Allergies  Allergen Reactions  . Sulfonamide Derivatives Rash    Family History: Family History  Problem Relation Age of Onset  . CAD    . Heart attack Mother   . Hyperlipidemia Mother   . Prostate cancer Neg Hx     Social History:  reports that he has quit smoking. He has never used smokeless tobacco. He reports that he does not drink alcohol or use illicit drugs.  ROS: UROLOGY Frequent Urination?: Yes Hard to postpone urination?: Yes Burning/pain with urination?: No Get up at night to urinate?: Yes Leakage of urine?: Yes Urine stream starts and stops?: No Trouble starting stream?: No Do you have to strain to urinate?: No Blood in urine?: No Urinary tract infection?: No Sexually transmitted disease?: No Injury to kidneys or bladder?: No Painful intercourse?: No Weak stream?: No Erection problems?: Yes Penile pain?: No  Gastrointestinal Nausea?: No Vomiting?: No Indigestion/heartburn?: No Diarrhea?: No Constipation?: No  Constitutional Fever: No Night sweats?: No Weight loss?: No Fatigue?: No  Skin Skin rash/lesions?: No Itching?: No  Eyes Blurred vision?: No Double vision?: No  Ears/Nose/Throat Sore throat?: No Sinus problems?: No  Hematologic/Lymphatic Swollen glands?: No Easy bruising?: No  Cardiovascular Leg swelling?: No Chest pain?: No  Respiratory Cough?: No Shortness of breath?: No  Endocrine Excessive thirst?:  No  Musculoskeletal Back pain?: Yes Joint pain?: Yes  Neurological Headaches?: No Dizziness?: No  Psychologic Depression?: No Anxiety?: No  Physical Exam: BP 116/74 mmHg  Pulse 74  Ht '5\' 9"'$  (1.753 m)  Wt 192 lb 6.4 oz (87.272 kg)  BMI 28.40 kg/m2  Constitutional:  Alert and oriented, No acute distress. HEENT: [place] AT, moist mucus membranes.  Trachea midline, no masses. Cardiovascular: No clubbing, cyanosis, or edema. Respiratory: Normal respiratory effort, no increased work of breathing. GI: Abdomen is soft, nontender, nondistended, no abdominal masses GU: No CVA tenderness.  Skin: No rashes, bruises or suspicious lesions. Lymph: No cervical or inguinal adenopathy. Neurologic: Grossly intact, no focal deficits, moving all 4 extremities. Psychiatric: Normal mood and affect.  Laboratory Data: Lab Results  Component Value Date   WBC 5.3 04/19/2016   HGB 13.3 10/26/2015   HCT 43.5 04/19/2016   MCV 79 04/19/2016   PLT 233 04/19/2016    Lab Results  Component Value Date   CREATININE 0.86 04/19/2016    No results found for: PSA  No results found for: TESTOSTERONE  Lab Results  Component Value Date   HGBA1C 7.5* 04/19/2016    Urinalysis    Component Value Date/Time   COLORURINE Yellow 11/11/2014 [place] 06/15/2010 0942   APPEARANCEUR Clear 05/07/2016 1026   APPEARANCEUR Clear 11/11/2014 [place] 06/15/2010 0942   LABSPEC 1.015 11/11/2014 0450   LABSPEC 1.016 06/15/2010 0942   PHURINE 6.0 11/11/2014 0450   PHURINE 7.0 06/15/2010 0942   GLUCOSEU Trace* 05/07/2016 1026   GLUCOSEU Negative 11/11/2014 0450   HGBUR Negative 11/11/2014 0450   HGBUR NEGATIVE 06/15/2010 0942   BILIRUBINUR Negative 05/07/2016 1026   BILIRUBINUR NEG 04/18/2016 1057   BILIRUBINUR Negative 11/11/2014 [place] 06/15/2010 0942   KETONESUR Negative 11/11/2014 [place] 06/15/2010 [place]  Negative  05/07/2016 1026   PROTEINUR NEG 04/18/2016 1057   PROTEINUR Negative 11/11/2014 0450   PROTEINUR NEGATIVE 06/15/2010 0942   UROBILINOGEN 0.2 04/18/2016 1057   UROBILINOGEN 0.2 06/15/2010 0942   NITRITE Negative 05/07/2016 1026   NITRITE NEG 04/18/2016 1057   NITRITE Negative 11/11/2014 0450   NITRITE NEGATIVE 06/15/2010 0942   LEUKOCYTESUR Negative 05/07/2016 1026   LEUKOCYTESUR Negative 04/18/2016 1057   LEUKOCYTESUR Negative 11/11/2014 0450    Pertinent Imaging: none  Assessment & Plan:  The patient has what appears to be stress urinary incontinence. He has very little urinary urge/urge incontinence complaints.  Interestingly, the had a radical prostatectomy in 1998 and was transferred greater than 20 years following the operation. He describes a strong stream and incomplete bladder emptying making me think that this is not associated with the bladder neck scar/contracture. This is likely associated with urinary sphincter atrophy although there may be a neurologic component given his extensive neurologic history with 5 back surgeries.    1. Mixed incontinence Our plan at this point is to have the patient see a physical therapist for instructions on effective [place] exercises and pelvic floor strengthening. If the patient does not make any progress with pelvic floor physical therapy I would recommend that he see Dr. Bjorn Loser, M.D. For further evaluation of his post-prostatectomy urinary incontinence. His workup would likely include cystoscopy and urodynamics with a recommendation of a potential male urethral sling to fix his incontinence. - Urinalysis, Complete - Bladder Scan (Post Void Residual) in office   Cc: Dr. Bjorn Loser, MD, [facility], [place], [place]  No Follow-up on file.  Ardis Hughs, [facility] [address], [place], [place] [address] [phone]

## 2016-06-03 DIAGNOSIS — D044 Carcinoma in situ of skin of scalp and neck: Secondary | ICD-10-CM | POA: Diagnosis not present

## 2016-06-03 DIAGNOSIS — L570 Actinic keratosis: Secondary | ICD-10-CM | POA: Diagnosis not present

## 2016-06-03 DIAGNOSIS — X32XXXA Exposure to sunlight, initial encounter: Secondary | ICD-10-CM | POA: Diagnosis not present

## 2016-06-03 DIAGNOSIS — Z08 Encounter for follow-up examination after completed treatment for malignant neoplasm: Secondary | ICD-10-CM | POA: Diagnosis not present

## 2016-06-03 DIAGNOSIS — D485 Neoplasm of uncertain behavior of skin: Secondary | ICD-10-CM | POA: Diagnosis not present

## 2016-06-03 DIAGNOSIS — Z85828 Personal history of other malignant neoplasm of skin: Secondary | ICD-10-CM | POA: Diagnosis not present

## 2016-06-04 DIAGNOSIS — R278 Other lack of coordination: Secondary | ICD-10-CM | POA: Diagnosis not present

## 2016-06-04 DIAGNOSIS — N393 Stress incontinence (female) (male): Secondary | ICD-10-CM | POA: Diagnosis not present

## 2016-06-04 DIAGNOSIS — M6281 Muscle weakness (generalized): Secondary | ICD-10-CM | POA: Diagnosis not present

## 2016-06-04 DIAGNOSIS — N3942 Incontinence without sensory awareness: Secondary | ICD-10-CM | POA: Diagnosis not present

## 2016-06-12 DIAGNOSIS — R278 Other lack of coordination: Secondary | ICD-10-CM | POA: Diagnosis not present

## 2016-06-12 DIAGNOSIS — N3942 Incontinence without sensory awareness: Secondary | ICD-10-CM | POA: Diagnosis not present

## 2016-06-12 DIAGNOSIS — N393 Stress incontinence (female) (male): Secondary | ICD-10-CM | POA: Diagnosis not present

## 2016-06-12 DIAGNOSIS — M6281 Muscle weakness (generalized): Secondary | ICD-10-CM | POA: Diagnosis not present

## 2016-06-19 DIAGNOSIS — M6281 Muscle weakness (generalized): Secondary | ICD-10-CM | POA: Diagnosis not present

## 2016-06-19 DIAGNOSIS — R278 Other lack of coordination: Secondary | ICD-10-CM | POA: Diagnosis not present

## 2016-06-19 DIAGNOSIS — N393 Stress incontinence (female) (male): Secondary | ICD-10-CM | POA: Diagnosis not present

## 2016-06-19 DIAGNOSIS — N3942 Incontinence without sensory awareness: Secondary | ICD-10-CM | POA: Diagnosis not present

## 2016-06-27 DIAGNOSIS — R278 Other lack of coordination: Secondary | ICD-10-CM | POA: Diagnosis not present

## 2016-06-27 DIAGNOSIS — N3942 Incontinence without sensory awareness: Secondary | ICD-10-CM | POA: Diagnosis not present

## 2016-06-27 DIAGNOSIS — N393 Stress incontinence (female) (male): Secondary | ICD-10-CM | POA: Diagnosis not present

## 2016-06-27 DIAGNOSIS — M6281 Muscle weakness (generalized): Secondary | ICD-10-CM | POA: Diagnosis not present

## 2016-07-01 ENCOUNTER — Ambulatory Visit (INDEPENDENT_AMBULATORY_CARE_PROVIDER_SITE_OTHER): Payer: Medicare Other | Admitting: *Deleted

## 2016-07-01 DIAGNOSIS — I442 Atrioventricular block, complete: Secondary | ICD-10-CM

## 2016-07-01 NOTE — Progress Notes (Signed)
Remote pacemaker transmission.   

## 2016-07-02 ENCOUNTER — Ambulatory Visit: Payer: Medicare Other | Admitting: Family Medicine

## 2016-07-03 ENCOUNTER — Encounter: Payer: Self-pay | Admitting: Cardiology

## 2016-07-03 LAB — CUP PACEART REMOTE DEVICE CHECK
Battery Remaining Longevity: 53 mo
Battery Voltage: 2.89 V
Brady Statistic AS VS Percent: 1 %
Brady Statistic RV Percent Paced: 99 %
Implantable Lead Implant Date: 20111208
Implantable Lead Location: 753859
Lead Channel Impedance Value: 390 Ohm
Lead Channel Impedance Value: 430 Ohm
Lead Channel Setting Pacing Amplitude: 2 V
Lead Channel Setting Sensing Sensitivity: 4 mV
MDC IDC LEAD IMPLANT DT: 20111208
MDC IDC LEAD LOCATION: 753860
MDC IDC LEAD MODEL: 1948
MDC IDC MSMT BATTERY REMAINING PERCENTAGE: 57 %
MDC IDC PG SERIAL: 7196739
MDC IDC SESS DTM: 20170710114423
MDC IDC SET LEADCHNL RV PACING AMPLITUDE: 2.5 V
MDC IDC SET LEADCHNL RV PACING PULSEWIDTH: 0.8 ms
MDC IDC STAT BRADY AP VP PERCENT: 3.9 %
MDC IDC STAT BRADY AP VS PERCENT: 1 %
MDC IDC STAT BRADY AS VP PERCENT: 95 %
MDC IDC STAT BRADY RA PERCENT PACED: 3.4 %

## 2016-07-10 DIAGNOSIS — D044 Carcinoma in situ of skin of scalp and neck: Secondary | ICD-10-CM | POA: Diagnosis not present

## 2016-07-10 DIAGNOSIS — N393 Stress incontinence (female) (male): Secondary | ICD-10-CM | POA: Diagnosis not present

## 2016-07-10 DIAGNOSIS — M6281 Muscle weakness (generalized): Secondary | ICD-10-CM | POA: Diagnosis not present

## 2016-07-10 DIAGNOSIS — R278 Other lack of coordination: Secondary | ICD-10-CM | POA: Diagnosis not present

## 2016-07-10 DIAGNOSIS — N3942 Incontinence without sensory awareness: Secondary | ICD-10-CM | POA: Diagnosis not present

## 2016-07-22 ENCOUNTER — Telehealth: Payer: Self-pay

## 2016-07-22 NOTE — Telephone Encounter (Signed)
lmov  To schedule fu with Dr. [name] in October

## 2016-07-24 DIAGNOSIS — N393 Stress incontinence (female) (male): Secondary | ICD-10-CM | POA: Diagnosis not present

## 2016-07-24 DIAGNOSIS — M6281 Muscle weakness (generalized): Secondary | ICD-10-CM | POA: Diagnosis not present

## 2016-07-24 DIAGNOSIS — R278 Other lack of coordination: Secondary | ICD-10-CM | POA: Diagnosis not present

## 2016-07-24 DIAGNOSIS — N3942 Incontinence without sensory awareness: Secondary | ICD-10-CM | POA: Diagnosis not present

## 2016-07-25 ENCOUNTER — Other Ambulatory Visit: Payer: Self-pay | Admitting: Cardiovascular Disease

## 2016-08-21 DIAGNOSIS — G4733 Obstructive sleep apnea (adult) (pediatric): Secondary | ICD-10-CM | POA: Diagnosis not present

## 2016-09-12 ENCOUNTER — Encounter: Payer: Self-pay | Admitting: *Deleted

## 2016-09-12 DIAGNOSIS — Z23 Encounter for immunization: Secondary | ICD-10-CM | POA: Diagnosis not present

## 2016-09-13 ENCOUNTER — Encounter: Payer: Self-pay | Admitting: Nurse Practitioner

## 2016-09-23 ENCOUNTER — Encounter: Payer: Self-pay | Admitting: Cardiovascular Disease

## 2016-09-23 ENCOUNTER — Ambulatory Visit (INDEPENDENT_AMBULATORY_CARE_PROVIDER_SITE_OTHER): Payer: Medicare Other | Admitting: Cardiovascular Disease

## 2016-09-23 VITALS — BP 140/64 | HR 65 | Ht 69.0 in | Wt 196.2 lb

## 2016-09-23 DIAGNOSIS — I428 Other cardiomyopathies: Secondary | ICD-10-CM

## 2016-09-23 DIAGNOSIS — I10 Essential (primary) hypertension: Secondary | ICD-10-CM

## 2016-09-23 DIAGNOSIS — Z950 Presence of cardiac pacemaker: Secondary | ICD-10-CM

## 2016-09-23 DIAGNOSIS — I442 Atrioventricular block, complete: Secondary | ICD-10-CM

## 2016-09-23 DIAGNOSIS — E782 Mixed hyperlipidemia: Secondary | ICD-10-CM

## 2016-09-23 DIAGNOSIS — C342 Malignant neoplasm of middle lobe, bronchus or lung: Secondary | ICD-10-CM

## 2016-09-23 DIAGNOSIS — I2510 Atherosclerotic heart disease of native coronary artery without angina pectoris: Secondary | ICD-10-CM | POA: Diagnosis not present

## 2016-09-23 NOTE — Patient Instructions (Signed)

## 2016-09-23 NOTE — Progress Notes (Signed)
Cardiology Office Note  Date:  09/23/2016   ID:  Howard [name], DOB [birth year], MRN [id number]  PCP:  [name], PA   Chief Complaint  Patient presents with  . other    6 month follow up. Meds reviewed by the pt. verbally. "doing well."     HPI:  Howard [name] is a 82 year old-year-old gentleman, patient of [facility], with a hx of complete heart block, status post pacemaker in 2011, nonischemic cardiomyopathy, coronary artery disease, 70% distal RCA disease, 30 and 40% lesions in his ramus, LAD, with admission to the hospital 09/05/2014  with acute cholecystitis, ileus. Cardiac catheterization at that time for positive stress test . He presents for follow-up of his CAD  In follow-up, he reports that he is doing well.  He denies any chest pain concerning for angina Exercising several days per week   03/2015 A1C 7.1 up to 7.5 Total chol  147, LDL 72 No recent lab work available  CT chest 03/2016 Decreasing size of pathology-proven adenocarcinoma of right upper lobe, with associated radiation treatment effects of the lung.  EKG on today's visit shows paced rhythm, 65 bpm  Other past medical history reviewed He was in the hospital November 2016 for abscess on his back, had surgery Nodules found in the right lung, biopsy documenting adenocarcinoma 1.3 cm, treated with radiation  Lab work reviewed with him showing total cholesterol 133, hemoglobin A1c 7.1  Previous chest CT scan showing moderate coronary calcifications in the LAD and diagonal branches, ramus Also with at least mild diffuse aortic atherosclerosis, mild carotid disease  vertigo February 2016  In early September 2015 he was seen in the [place] office with symptoms of chest pain and shortness of breath,  he had a Abnormal Myoview  cardiac catheterization showing details above Echocardiogram showing ejection fraction 35-40%  After discharge she continued to have similar symptoms of  epigastric discomfort radiating through to his back Admitted to the hospital for cholecystectomy  PMH:   has a past medical history of Arthritis; Bell palsy; Cancer ([place]); Chronic combined systolic and diastolic CHF, NYHA class 1 ([place]); Complete heart block ([place]); Depression; Diabetes mellitus without complication ([place]); Fall (11-10-14); GERD (gastroesophageal reflux disease); History of prostate cancer; Hyperlipidemia; Hypertension; LBBB (left bundle branch block); Left-sided Bell's palsy; Lung cancer ([place]) (2016); NICM (nonischemic cardiomyopathy) ([place]); Non-obstructive CAD; Poor balance; Presence of permanent cardiac pacemaker; Sleep apnea; Vertigo; and WPW (Wolff-Parkinson-White syndrome).  PSH:    Past Surgical History:  Procedure Laterality Date  . APPLICATION OF WOUND VAC Left 06/07/2015   Procedure: APPLICATION OF WOUND VAC;  Surgeon: Robert Bellow, MD;  Location: [facility] ORS;  Service: General;  Laterality: Left;  left upper back  . BACK SURGERY     2011  . CARDIAC CATHETERIZATION  08/26/2014   Single vessel obstructive CAD  . CARPAL TUNNEL RELEASE  04-04-15   [facility]  . CATARACT EXTRACTION  07-31-11 and 09-18-11  . Catheter ablation  1991   for WPW  . cervical fusion    . CHOLECYSTECTOMY  09-07-14  . HAND SURGERY     right 1993; left 2005  . HERNIA REPAIR  1955  . INSERT / REPLACE / REMOVE PACEMAKER    . JOINT REPLACEMENT Left 2013   knee  . JOINT REPLACEMENT Right 2004   knee  . KNEE SURGERY     left knee 1991 and 1992; right knee 1995  . LEFT HEART CATHETERIZATION WITH CORONARY ANGIOGRAM N/A 08/26/2014   Procedure: LEFT  HEART CATHETERIZATION WITH CORONARY ANGIOGRAM;  Surgeon: [name], MD;  Location: [facility] CATH LAB;  Service: Cardiovascular;  Laterality: N/A;  . LUMBAR LAMINECTOMY/DECOMPRESSION MICRODISCECTOMY N/A 06/07/2014   Procedure: LUMBAR FOUR TO FIVE LUMBAR LAMINECTOMY/DECOMPRESSION MICRODISCECTOMY 1 LEVEL;  Surgeon: [name], MD;  Location: [facility]O ORS;  Service:  Neurosurgery;  Laterality: N/A;  . LUNG BIOPSY Right 2016   Dr [name]  . PACEMAKER INSERTION     PPM-- St Jude 11/30/10 by [name]  . PROSTATE SURGERY     cancer--1998, prostatectomy  . REPLACEMENT TOTAL KNEE     2004  . ruptured disc     1962 and 1998  . TRIGGER FINGER RELEASE  01-24-15  . WOUND DEBRIDEMENT Left 06/07/2015   Procedure: DEBRIDEMENT WOUND;  Surgeon: Robert Bellow, MD;  Location: [facility] ORS;  Service: General;  Laterality: Left;  left upper back    Current Outpatient Prescriptions  Medication Sig Dispense Refill  . amLODipine (NORVASC) 5 MG tablet TAKE 1 TABLET DAILY 90 tablet 3  . aspirin 81 MG tablet Take 81 mg by mouth daily.      . Cholecalciferol (VITAMIN D3) 1000 UNITS CAPS Take 1,000 Units by mouth daily.     . Colesevelam HCl (WELCHOL) 3.75 G PACK Take 3.75 g by mouth daily. Mix in [place] of water and drink.    . hydrochlorothiazide (HYDRODIURIL) 25 MG tablet TAKE 1 TABLET DAILY 90 tablet 3  . lisinopril (PRINIVIL,ZESTRIL) 10 MG tablet TAKE 1 TABLET DAILY 90 tablet 3  . metFORMIN (GLUCOPHAGE) 500 MG tablet TAKE 1 TABLET TWICE A DAY 180 tablet 1  . metoprolol succinate (TOPROL-XL) 25 MG 24 hr tablet TAKE 1 TABLET DAILY 90 tablet 3  . Multiple Vitamin (MULTIVITAMIN) capsule Take 1 capsule by mouth daily.      Marland Kitchen omeprazole (PRILOSEC) 40 MG capsule Take 1 capsule (40 mg total) by mouth 2 (two) times daily. 180 capsule 3  . simvastatin (ZOCOR) 40 MG tablet Take 1 tablet (40 mg total) by mouth at bedtime. 90 tablet 3   No current facility-administered medications for this visit.      Allergies:   Sulfonamide derivatives   Social History:  The patient  reports that he has quit smoking. He quit after 4.00 years of use. He has never used smokeless tobacco. He reports that he does not drink alcohol or use drugs.   Family History:   family history includes Heart attack in his mother; Hyperlipidemia in his mother.    Review of Systems: Review of Systems  Constitutional:  Negative.   Respiratory: Negative.   Cardiovascular: Negative.   Gastrointestinal: Negative.   Musculoskeletal: Negative.   Neurological: Negative.   Psychiatric/Behavioral: Negative.   All other systems reviewed and are negative.    PHYSICAL EXAM: VS:  BP 140/64 (BP Location: Left Arm, Patient Position: Sitting, Cuff Size: Normal)   Pulse 65   Ht '5\' 9"'$  (1.753 m)   Wt 196 lb 4 oz (89 kg)   BMI 28.98 kg/m  , BMI Body mass index is 28.98 kg/m. GEN: Well nourished, well developed, in no acute distress  HEENT: normal  Neck: no JVD, carotid bruits, or masses Cardiac: RRR; no murmurs, rubs, or gallops,no edema  Respiratory:  clear to auscultation bilaterally, normal work of breathing GI: soft, nontender, nondistended, + BS MS: no deformity or atrophy  Skin: warm and dry, no rash Neuro:  Strength and sensation are intact Psych: euthymic mood, full affect    Recent Labs:  10/26/2015: Hemoglobin 13.3 04/19/2016: ALT 23; BUN 17; Creatinine, Ser 0.86; Platelets 233; Potassium 4.7; Sodium 136; TSH 1.780    Lipid Panel Lab Results  Component Value Date   CHOL 147 04/19/2016   HDL 48 04/19/2016   LDLCALC 72 04/19/2016   TRIG 133 04/19/2016      Wt Readings from Last 3 Encounters:  09/23/16 196 lb 4 oz (89 kg)  05/07/16 192 lb 6.4 oz (87.3 kg)  05/02/16 195 lb 6.4 oz (88.6 kg)       ASSESSMENT AND PLAN:  Coronary artery disease involving native coronary artery of native heart without angina pectoris - Plan: EKG 12-Lead Currently with no symptoms of angina. No further workup at this time. Continue current medication regimen.  Complete heart block (HCC) - Plan: EKG 12-Lead S/p pacemaker Previous interrogation showing 4 years remaining on battery  Essential hypertension - Plan: EKG 12-Lead Blood pressure is well controlled on today's visit. No changes made to the medications.  Mixed hyperlipidemia Climb in cholesterol with recent weight gain Recommended he watch his  carbohydrates, needs small weight loss Lower A1c If numbers continue to trend upwards, may need more medication such as low  Nonischemic cardiomyopathy ([place]) Denies any symptoms concerning for CHF Continue beta blocker, ACE inhibitor  Pacemaker-St.Jude Followed by EP  Malignant neoplasm of middle lobe of right lung [facility]) Previous CT scan March 2017 reviewed with him He reports he has follow-up CT scan   Total encounter time more than 25 minutes  Greater than 50% was spent in counseling and coordination of care with the patient   Disposition:   F/U  12 months   Orders Placed This Encounter  Procedures  . EKG 12-Lead     Signed, Esmond Plants, M.D., Ph.D. 09/23/2016  [place], [place]

## 2016-10-01 NOTE — Progress Notes (Addendum)
Electrophysiology Office Note Date: 10/02/2016  ID:  Howard [name], DOB [birth year], MRN [id number]  PCP: Howard Murders, PA Primary Cardiologist: Howard [name] Electrophysiologist: Howard [name]  CC: Pacemaker follow-up  Howard [name] is a 82 y.o. male seen today for Dr Howard [name].  He presents today for routine electrophysiology followup.  Since last being seen in our clinic, the patient reports doing very well.  He is exercising 3 days a week with cardiac rehab.  He denies chest pain, palpitations, dyspnea, PND, orthopnea, nausea, vomiting, dizziness, syncope, edema, weight gain, or early satiety.  Device History: STJ dual chamber PPM implanted 2011 for complete heart block    Past Medical History:  Diagnosis Date  . Arthritis   . Bell palsy   . Cancer [facility])    prostate and skin  . Chronic combined systolic and diastolic CHF, NYHA class 1 ([place])    a. 07/2014 Echo: EF 35-40%, Gr 1 DD.  Marland Kitchen Complete heart block ([place])    a. 11/2010 s/p SJM 2210 Accent DC PPM, ser# [id number].  . Depression   . Diabetes mellitus without complication ([place])   . Fall 11-10-14  . GERD (gastroesophageal reflux disease)   . History of prostate cancer   . Hyperlipidemia   . Hypertension   . LBBB (left bundle branch block)   . Left-sided Bell's palsy   . Lung cancer ([place]) 2016  . NICM (nonischemic cardiomyopathy) ([place])    a. 07/2014 Echo: EF 35-40%, mid-apicalanteroseptal DK, Gr 1 DD, mild-mod dil LA.  . Non-obstructive CAD    a. 07/2014 Abnl MV;  b. 08/2014 Cath: LM nl, LAD 30p, RI 40p, LCX nl, OM1 40, RCA dominant 30p, 70d-->Med Rx.  Marland Kitchen Poor balance   . Presence of permanent cardiac pacemaker   . Sleep apnea    a. cpap  . Vertigo   . WPW (Wolff-Parkinson-White syndrome)    a. S/P RFCA 1991.   Past Surgical History:  Procedure Laterality Date  . APPLICATION OF WOUND VAC Left 06/07/2015   Procedure: APPLICATION OF WOUND VAC;  Surgeon: Robert Bellow, MD;  Location: [facility] ORS;  Service: General;  Laterality:  Left;  left upper back  . BACK SURGERY     2011  . CARDIAC CATHETERIZATION  08/26/2014   Single vessel obstructive CAD  . CARPAL TUNNEL RELEASE  04-04-15   [facility]  . CATARACT EXTRACTION  07-31-11 and 09-18-11  . Catheter ablation  1991   for WPW  . cervical fusion    . CHOLECYSTECTOMY  09-07-14  . HAND SURGERY     right 1993; left 2005  . HERNIA REPAIR  1955  . INSERT / REPLACE / REMOVE PACEMAKER    . JOINT REPLACEMENT Left 2013   knee  . JOINT REPLACEMENT Right 2004   knee  . KNEE SURGERY     left knee 1991 and 1992; right knee 1995  . LEFT HEART CATHETERIZATION WITH CORONARY ANGIOGRAM N/A 08/26/2014   Procedure: LEFT HEART CATHETERIZATION WITH CORONARY ANGIOGRAM;  Surgeon: [name], MD;  Location: [facility] CATH LAB;  Service: Cardiovascular;  Laterality: N/A;  . LUMBAR LAMINECTOMY/DECOMPRESSION MICRODISCECTOMY N/A 06/07/2014   Procedure: LUMBAR FOUR TO FIVE LUMBAR LAMINECTOMY/DECOMPRESSION MICRODISCECTOMY 1 LEVEL;  Surgeon: [name], MD;  Location: [place] NEURO ORS;  Service: Neurosurgery;  Laterality: N/A;  . LUNG BIOPSY Right 2016   Dr [name]  . PACEMAKER INSERTION     PPM-- St Jude 11/30/10 by [name]  . PROSTATE SURGERY     cancer--1998,  prostatectomy  . REPLACEMENT TOTAL KNEE     2004  . ruptured disc     1962 and 1998  . TRIGGER FINGER RELEASE  01-24-15  . WOUND DEBRIDEMENT Left 06/07/2015   Procedure: DEBRIDEMENT WOUND;  Surgeon: Robert Bellow, MD;  Location: [facility] ORS;  Service: General;  Laterality: Left;  left upper back    Current Outpatient Prescriptions  Medication Sig Dispense Refill  . amLODipine (NORVASC) 5 MG tablet Take 5 mg by mouth daily.    Marland Kitchen aspirin 81 MG tablet Take 81 mg by mouth daily.      . Cholecalciferol (VITAMIN D3) 1000 UNITS CAPS Take 1,000 Units by mouth daily.     . Colesevelam HCl (WELCHOL) 3.75 G PACK Take 3.75 g by mouth daily. Mix in [place] of water and drink.    . hydrochlorothiazide (HYDRODIURIL) 25 MG tablet Take 25 mg by mouth daily.    Marland Kitchen  lisinopril (PRINIVIL,ZESTRIL) 10 MG tablet Take 10 mg by mouth daily.    . metFORMIN (GLUCOPHAGE) 500 MG tablet Take by mouth 2 (two) times daily with a meal.    . metoprolol succinate (TOPROL-XL) 25 MG 24 hr tablet Take 25 mg by mouth daily.    . Multiple Vitamin (MULTIVITAMIN) capsule Take 1 capsule by mouth daily.      Marland Kitchen omeprazole (PRILOSEC) 40 MG capsule Take 1 capsule (40 mg total) by mouth 2 (two) times daily. 180 capsule 3  . simvastatin (ZOCOR) 40 MG tablet Take 1 tablet (40 mg total) by mouth at bedtime. 90 tablet 3   No current facility-administered medications for this visit.     Allergies:   Sulfonamide derivatives   Social History: Social History   Social History  . Marital status: Married    Spouse name: N/A  . Number of children: 2  . Years of education: College   Occupational History  . Retired    Social History Main Topics  . Smoking status: Former Smoker    Years: 4.00  . Smokeless tobacco: Never Used     Comment: Quit 2011  . Alcohol use No     Comment: occasional  . Drug use: No  . Sexual activity: Not on file   Other Topics Concern  . Not on file   Social History Narrative   Drinks 2 cups of coffee a day     Family History: Family History  Problem Relation Age of Onset  . Heart attack Mother   . Hyperlipidemia Mother   . CAD    . Prostate cancer Neg Hx      Review of Systems: All other systems reviewed and are otherwise negative except as noted above.   Physical Exam: VS:  BP (!) 132/50   Pulse 64   Ht '5\' 9"'$  (1.753 m)   Wt 197 lb 9.6 oz (89.6 kg)   BMI 29.18 kg/m  , BMI Body mass index is 29.18 kg/m.  GEN- The patient is elderly appearing, alert and oriented x 3 today.   HEENT: normocephalic, atraumatic; sclera clear, conjunctiva pink; hearing intact; oropharynx clear; neck supple Lungs- Clear to ausculation bilaterally, normal work of breathing.  No wheezes, rales, rhonchi Heart- Regular rate and rhythm GI- soft, non-tender,  non-distended, bowel sounds present Extremities- no clubbing, cyanosis, or edema MS- no significant deformity or atrophy Skin- warm and dry, no rash or lesion; PPM pocket well healed Psych- euthymic mood, full affect Neuro- strength and sensation are intact  PPM Interrogation- reviewed in detail today,  See PACEART report  EKG:  EKG is not ordered today.  Recent Labs: 10/26/2015: Hemoglobin 13.3 04/19/2016: ALT 23; BUN 17; Creatinine, Ser 0.86; Platelets 233; Potassium 4.7; Sodium 136; TSH 1.780   Wt Readings from Last 3 Encounters:  10/02/16 197 lb 9.6 oz (89.6 kg)  09/23/16 196 lb 4 oz (89 kg)  05/07/16 192 lb 6.4 oz (87.3 kg)     Other studies Reviewed: Additional studies/ records that were reviewed today include: Dr Howard [name] and Dr Jackalyn Lombard office notes  Assessment and Plan:  1.  Complete heart block  Normal PPM function See Pace Art report No changes today I have discussed the available STJ firmware patch for cyberhacking with the patient today.  We have discussed the risks associated with the firmware upgrade as well as the theoretical risk for cyberhacking.  After an informed discussion, the patient has decided not to install the firmware patch at this time.    2.  HTN Stable No change required today  3.  Non ischemic cardiomyopathy Continue ACE-I/BB Dr Howard [name] did not feel that upgrade was appropriate at last office visit with NYHA Class I/II symptoms.   Current medicines are reviewed at length with the patient today.   The patient does not have concerns regarding his medicines.  The following changes were made today:  none  Labs/ tests ordered today include: non No orders of the defined types were placed in this encounter.    Disposition:   Follow up with [place], the patient would like to follow in [place] with Dr Caryl Comes for his pacemaker as that office is a mile from the house.    Signed, Chanetta Marshall, NP 10/02/2016 10:02 AM  [place] [address] [place] [id number] [phone] (office) [phone] (fax

## 2016-10-02 ENCOUNTER — Ambulatory Visit (INDEPENDENT_AMBULATORY_CARE_PROVIDER_SITE_OTHER): Payer: Medicare Other | Admitting: Nurse Practitioner

## 2016-10-02 ENCOUNTER — Encounter: Payer: Self-pay | Admitting: Internal Medicine

## 2016-10-02 ENCOUNTER — Encounter: Payer: Self-pay | Admitting: Nurse Practitioner

## 2016-10-02 VITALS — BP 132/50 | HR 64 | Ht 69.0 in | Wt 197.6 lb

## 2016-10-02 DIAGNOSIS — I442 Atrioventricular block, complete: Secondary | ICD-10-CM | POA: Diagnosis not present

## 2016-10-02 DIAGNOSIS — I5022 Chronic systolic (congestive) heart failure: Secondary | ICD-10-CM

## 2016-10-02 DIAGNOSIS — I2510 Atherosclerotic heart disease of native coronary artery without angina pectoris: Secondary | ICD-10-CM

## 2016-10-02 DIAGNOSIS — I10 Essential (primary) hypertension: Secondary | ICD-10-CM | POA: Diagnosis not present

## 2016-10-02 LAB — CUP PACEART INCLINIC DEVICE CHECK
Implantable Lead Implant Date: 20111208
Implantable Lead Location: 753859
Implantable Lead Location: 753860
Implantable Lead Model: 1948
MDC IDC LEAD IMPLANT DT: 20111208
MDC IDC SESS DTM: 20171011100210
Pulse Gen Model: 2210
Pulse Gen Serial Number: 7196739

## 2016-10-02 NOTE — Patient Instructions (Signed)
Medication Instructions:   Your physician recommends that you continue on your current medications as directed. Please refer to the Current Medication list given to you today.    If you need a refill on your cardiac medications before your next appointment, please call your pharmacy.  Labwork: NONE ORDER TODAY    Testing/Procedures:  NONE ORDER TODAY    Follow-Up:  Your physician wants you to follow-up in: [place] will receive a reminder letter in the mail two months in advance. If you don't receive a letter, please call our office to schedule the follow-up appointment.  Remote monitoring is used to monitor your Pacemaker of ICD from home. This monitoring reduces the number of office visits required to check your device to one time per year. It allows [place] to keep an eye on the functioning of your device to ensure it is working properly. You are scheduled for a device check from home on . 01/02/2017 You may send your transmission at any time that day. If you have a wireless device, the transmission will be sent automatically. After your physician reviews your transmission, you will receive a postcard with your next transmission date.     Any Other Special Instructions Will Be Listed Below (If Applicable).

## 2016-10-04 ENCOUNTER — Encounter: Payer: Self-pay | Admitting: *Deleted

## 2016-10-10 ENCOUNTER — Ambulatory Visit
Admission: RE | Admit: 2016-10-10 | Discharge: 2016-10-10 | Disposition: A | Discharge status: Home / Self Care | Payer: Medicare Other | Source: Ambulatory Visit | Attending: Radiation Oncology | Admitting: Radiation Oncology

## 2016-10-10 DIAGNOSIS — K760 Fatty (change of) liver, not elsewhere classified: Secondary | ICD-10-CM | POA: Diagnosis not present

## 2016-10-10 DIAGNOSIS — I700 Atherosclerosis of aorta: Secondary | ICD-10-CM | POA: Insufficient documentation

## 2016-10-10 DIAGNOSIS — C3411 Malignant neoplasm of upper lobe, right bronchus or lung: Secondary | ICD-10-CM | POA: Insufficient documentation

## 2016-10-10 DIAGNOSIS — I2510 Atherosclerotic heart disease of native coronary artery without angina pectoris: Secondary | ICD-10-CM | POA: Insufficient documentation

## 2016-10-10 DIAGNOSIS — C3491 Malignant neoplasm of unspecified part of right bronchus or lung: Secondary | ICD-10-CM | POA: Diagnosis not present

## 2016-10-10 LAB — POCT I-STAT CREATININE: Creatinine, Ser: 0.9 mg/dL (ref 0.61–1.24)

## 2016-10-10 MED ORDER — IOPAMIDOL (ISOVUE-300) INJECTION 61%
75.0000 mL | Freq: Once | INTRAVENOUS | Status: AC | PRN
  Administered 2016-10-10: 75 mL via INTRAVENOUS

## 2016-10-11 DIAGNOSIS — Z85828 Personal history of other malignant neoplasm of skin: Secondary | ICD-10-CM | POA: Diagnosis not present

## 2016-10-11 DIAGNOSIS — D0461 Carcinoma in situ of skin of right upper limb, including shoulder: Secondary | ICD-10-CM | POA: Diagnosis not present

## 2016-10-11 DIAGNOSIS — D485 Neoplasm of uncertain behavior of skin: Secondary | ICD-10-CM | POA: Diagnosis not present

## 2016-10-11 DIAGNOSIS — L570 Actinic keratosis: Secondary | ICD-10-CM | POA: Diagnosis not present

## 2016-10-11 DIAGNOSIS — X32XXXA Exposure to sunlight, initial encounter: Secondary | ICD-10-CM | POA: Diagnosis not present

## 2016-10-17 ENCOUNTER — Ambulatory Visit: Payer: Medicare Other | Admitting: Radiation Oncology

## 2016-10-22 ENCOUNTER — Ambulatory Visit: Payer: Medicare Other | Admitting: Radiation Oncology

## 2016-10-22 DIAGNOSIS — C3412 Malignant neoplasm of upper lobe, left bronchus or lung: Secondary | ICD-10-CM | POA: Insufficient documentation

## 2016-10-22 DIAGNOSIS — Z87891 Personal history of nicotine dependence: Secondary | ICD-10-CM | POA: Insufficient documentation

## 2016-10-22 DIAGNOSIS — Z85828 Personal history of other malignant neoplasm of skin: Secondary | ICD-10-CM | POA: Insufficient documentation

## 2016-10-22 DIAGNOSIS — I509 Heart failure, unspecified: Secondary | ICD-10-CM | POA: Insufficient documentation

## 2016-10-22 DIAGNOSIS — Z923 Personal history of irradiation: Secondary | ICD-10-CM | POA: Insufficient documentation

## 2016-10-24 ENCOUNTER — Ambulatory Visit (INDEPENDENT_AMBULATORY_CARE_PROVIDER_SITE_OTHER): Payer: Medicare Other | Admitting: Family Medicine

## 2016-10-24 VITALS — BP 122/69 | HR 68 | Temp 98.0°F | Ht 66.75 in | Wt 195.6 lb

## 2016-10-24 VITALS — BP 122/69 | HR 68 | Temp 98.0°F | Ht 66.75 in | Wt 195.4 lb

## 2016-10-24 DIAGNOSIS — Z0000 Encounter for general adult medical examination without abnormal findings: Secondary | ICD-10-CM | POA: Diagnosis not present

## 2016-10-24 DIAGNOSIS — E119 Type 2 diabetes mellitus without complications: Secondary | ICD-10-CM | POA: Diagnosis not present

## 2016-10-24 DIAGNOSIS — M48062 Spinal stenosis, lumbar region with neurogenic claudication: Secondary | ICD-10-CM

## 2016-10-24 DIAGNOSIS — E782 Mixed hyperlipidemia: Secondary | ICD-10-CM

## 2016-10-24 DIAGNOSIS — I442 Atrioventricular block, complete: Secondary | ICD-10-CM | POA: Diagnosis not present

## 2016-10-24 DIAGNOSIS — I2510 Atherosclerotic heart disease of native coronary artery without angina pectoris: Secondary | ICD-10-CM

## 2016-10-24 DIAGNOSIS — I10 Essential (primary) hypertension: Secondary | ICD-10-CM | POA: Diagnosis not present

## 2016-10-24 MED ORDER — METFORMIN HCL 500 MG PO TABS
500.0000 mg | ORAL_TABLET | Freq: Two times a day (BID) | ORAL | 4 refills | Status: DC
Start: 2016-10-24 — End: 2017-01-14

## 2016-10-24 NOTE — Progress Notes (Signed)
Patient: Howard [name] Male    DOB: 04-29-1931   82 y.o.   MRN: [id number] Visit Date: 10/24/2016  Today's Provider: [name], PA   Chief Complaint  Patient presents with  . Hyperlipidemia  . Hypertension  . Diabetes  . Follow-up   Subjective:    HPI  Diabetes Mellitus Type II, Follow-up:   Lab Results  Component Value Date   HGBA1C 7.5 (H) 04/19/2016   HGBA1C 7.1 (H) 09/18/2015   Last seen for diabetes 6 months ago.  Management since then includes continue Welchol and Metformin with diabetic diet. He reports excellent compliance with treatment. He is not having side effects.  Current symptoms include none and have been stable. Home blood sugar records: fasting range: 140's  Episodes of hypoglycemia? no   Current Insulin Regimen: none Weight trend: stable Current diet: in general, a "healthy" diet   Current exercise: cardiovascular workout on exercise equipment  ------------------------------------------------------------------------   Hypertension, follow-up:  BP Readings from Last 3 Encounters:  10/24/16 122/69  10/24/16 122/69  10/02/16 (!) 132/50    He was last seen for hypertension 6 months ago.  BP at that visit was 104/68 Management since that visit includes continue Amlodipine, HCTZ, Metoprolol and Lisinopril .He reports excellent compliance with treatment. He is not having side effects.  He is exercising. He is adherent to low salt diet.   Outside blood pressures are being checked when patient goes to hospital for exercise. He is experiencing none.  Patient denies chest pain, irregular heart beat and palpitations.   Cardiovascular risk factors include advanced age (older than 68 for men, 35 for women), diabetes mellitus, dyslipidemia, hypertension and male gender.  Use of agents associated with hypertension: none.   ------------------------------------------------------------------------    Lipid/Cholesterol, Follow-up:   Last seen  for this 6 months ago.  Management since that visit includes continue Welchol and Simvastatin .  Last Lipid Panel:    Component Value Date/Time   CHOL 147 04/19/2016 0941   TRIG 133 04/19/2016 0941   HDL 48 04/19/2016 0941   CHOLHDL 3.1 04/19/2016 0941   CHOLHDL 3.7 11/30/2010 0506   VLDL 23 11/30/2010 0506   LDLCALC 72 04/19/2016 0941    He reports excellent compliance with treatment. He is not having side effects.   Wt Readings from Last 3 Encounters:  10/24/16 195 lb 9.6 oz (88.7 kg)  10/24/16 195 lb 6 oz (88.6 kg)  10/02/16 197 lb 9.6 oz (89.6 kg)    ------------------------------------------------------------------------ Past Medical History:  Diagnosis Date  . Arthritis   . Bell palsy   . Cancer [facility])    prostate and skin  . Chronic combined systolic and diastolic CHF, NYHA class 1 ([place])    a. 07/2014 Echo: EF 35-40%, Gr 1 DD.  Marland Kitchen Complete heart block ([place])    a. 11/2010 s/p SJM 2210 Accent DC PPM, ser# [id number].  . Depression   . Diabetes mellitus without complication ([place])   . Fall 11-10-14  . GERD (gastroesophageal reflux disease)   . History of prostate cancer   . Hyperlipidemia   . Hypertension   . LBBB (left bundle branch block)   . Left-sided Bell's palsy   . Lung cancer ([place]) 2016  . NICM (nonischemic cardiomyopathy) ([place])    a. 07/2014 Echo: EF 35-40%, mid-apicalanteroseptal DK, Gr 1 DD, mild-mod dil LA.  . Non-obstructive CAD    a. 07/2014 Abnl MV;  b. 08/2014 Cath: LM nl, LAD 30p, RI 40p, LCX nl, OM1 40,  RCA dominant 30p, 70d-->Med Rx.  Marland Kitchen Poor balance   . Presence of permanent cardiac pacemaker   . Sleep apnea    a. cpap  . Vertigo   . WPW (Wolff-Parkinson-White syndrome)    a. S/P RFCA 1991.   Past Surgical History:  Procedure Laterality Date  . APPLICATION OF WOUND VAC Left 06/07/2015   Procedure: APPLICATION OF WOUND VAC;  Surgeon: Robert Bellow, MD;  Location: [facility] ORS;  Service: General;  Laterality: Left;  left upper back  . BACK  SURGERY     2011  . CARDIAC CATHETERIZATION  08/26/2014   Single vessel obstructive CAD  . CARPAL TUNNEL RELEASE  04-04-15   [facility]  . CATARACT EXTRACTION  07-31-11 and 09-18-11  . Catheter ablation  1991   for WPW  . cervical fusion    . CHOLECYSTECTOMY  09-07-14  . HAND SURGERY     right 1993; left 2005  . HERNIA REPAIR  1955  . INSERT / REPLACE / REMOVE PACEMAKER    . JOINT REPLACEMENT Left 2013   knee  . JOINT REPLACEMENT Right 2004   knee  . KNEE SURGERY     left knee 1991 and 1992; right knee 1995  . LEFT HEART CATHETERIZATION WITH CORONARY ANGIOGRAM N/A 08/26/2014   Procedure: LEFT HEART CATHETERIZATION WITH CORONARY ANGIOGRAM;  Surgeon: [name], MD;  Location: [facility] CATH LAB;  Service: Cardiovascular;  Laterality: N/A;  . LUMBAR LAMINECTOMY/DECOMPRESSION MICRODISCECTOMY N/A 06/07/2014   Procedure: LUMBAR FOUR TO FIVE LUMBAR LAMINECTOMY/DECOMPRESSION MICRODISCECTOMY 1 LEVEL;  Surgeon: [name], MD;  Location: [place] NEURO ORS;  Service: Neurosurgery;  Laterality: N/A;  . LUNG BIOPSY Right 2016   Dr [name]  . PACEMAKER INSERTION     PPM-- St Jude 11/30/10 by [name]  . PROSTATE SURGERY     cancer--1998, prostatectomy  . REPLACEMENT TOTAL KNEE     2004  . ruptured disc     1962 and 1998  . TRIGGER FINGER RELEASE  01-24-15  . WOUND DEBRIDEMENT Left 06/07/2015   Procedure: DEBRIDEMENT WOUND;  Surgeon: Robert Bellow, MD;  Location: [facility] ORS;  Service: General;  Laterality: Left;  left upper back      Previous Medications   AMLODIPINE (NORVASC) 5 MG TABLET    Take 5 mg by mouth daily.   ASPIRIN 81 MG TABLET    Take 81 mg by mouth daily.     CHOLECALCIFEROL (VITAMIN D3) 1000 UNITS CAPS    Take 1,000 Units by mouth daily.    COLESEVELAM HCL (WELCHOL) 3.75 G PACK    Take 3.75 g by mouth daily. Mix in [place] of water and drink.   HYDROCHLOROTHIAZIDE (HYDRODIURIL) 25 MG TABLET    Take 25 mg by mouth daily.   LISINOPRIL (PRINIVIL,ZESTRIL) 10 MG TABLET    Take 10 mg by mouth daily.    METFORMIN (GLUCOPHAGE) 500 MG TABLET    Take by mouth 2 (two) times daily with a meal.   METOPROLOL SUCCINATE (TOPROL-XL) 25 MG 24 HR TABLET    Take 25 mg by mouth daily.   MULTIPLE VITAMIN (MULTIVITAMIN) CAPSULE    Take 1 capsule by mouth daily.     OMEPRAZOLE (PRILOSEC) 40 MG CAPSULE    Take 1 capsule (40 mg total) by mouth 2 (two) times daily.   SIMVASTATIN (ZOCOR) 40 MG TABLET    Take 1 tablet (40 mg total) by mouth at bedtime.    Review of Systems  Constitutional: Negative.   Respiratory:  Negative.   Cardiovascular: Negative.   Gastrointestinal: Positive for diarrhea.  Endocrine: Negative.   Genitourinary: Positive for enuresis.  Musculoskeletal: Positive for back pain.    Social History  Substance Use Topics  . Smoking status: Former Smoker    Years: 4.00  . Smokeless tobacco: Never Used     Comment: Quit 2011  . Alcohol use 0.6 oz/week    1 Cans of beer per week     Comment: occasional   Objective:   BP 122/69 (BP Location: Right Arm)   Pulse 68   Temp 98 F (36.7 C) (Oral)   Ht 5' 6.75" (1.695 m)   Wt 195 lb 9.6 oz (88.7 kg)   BMI 30.87 kg/m   Physical Exam  Constitutional: He is oriented to person, place, and time. He appears well-developed and well-nourished.  HENT:  Head: Normocephalic and atraumatic.  Right Ear: External ear normal.  Left Ear: External ear normal.  Nose: Nose normal.  Mouth/Throat: Oropharynx is clear and moist.  Hearing deficit. Wears aids in both ears.  Eyes: Conjunctivae are normal.  Neck: Neck supple.  Cardiovascular: Normal rate and regular rhythm.   Pulmonary/Chest: Effort normal and breath sounds normal.  Pacemaker in left upper chest.  Abdominal: Soft. Bowel sounds are normal.  Musculoskeletal:  History of 3 back surgeries, C-spine fusion and both knee replacements. Feels his back is stable - continues to have pain climbing a hill.  Lymphadenopathy:    He has no cervical adenopathy.  Neurological: He is alert and oriented to  person, place, and time. He has normal reflexes.  Bilateral numbness discomfort in thighs without pain.  Skin:  Many actinic lesions on forearms and forehead followed by dermatologist regularly.  Psychiatric: He has a normal mood and affect. His behavior is normal. Thought content normal.      Assessment & Plan:     1. Diabetes mellitus without complication (HCC) FBS in the 140's recently. Metformin 500 mg BID and Welchol 3.75 gm qd with diabetic diet. No hypoglycemic episodes. Encouraged to maintain annual eye exam with ophthalmologist and podiatry screening. Will get Metformin refilled and follow up labs. Recheck pending reports. - CBC with Differential/Platelet - Comprehensive metabolic panel - Hemoglobin A1c - Lipid panel - metFORMIN (GLUCOPHAGE) 500 MG tablet; Take 1 tablet (500 mg total) by mouth 2 (two) times daily with a meal.  Dispense: 180 tablet; Refill: 4  2. Essential hypertension Well controlled without significant hypotensive episodes. Tolerating HCTZ 25 mg qd, Amlodipine 5 mg qd, Lisinopril 10 mg qd and Metoprolol-SL 25 mg qd without side effects. Recheck labs. - CBC with Differential/Platelet - Comprehensive metabolic panel - Lipid panel - TSH  3. Mixed hyperlipidemia Tolerating Simvastatin 40 mg qd and Welchol 3.75 gm qd. Trying to follow low fat diet restrictions. Recheck labs and follow up pending reports. - Lipid panel - TSH  4. Spinal stenosis, lumbar region, with neurogenic claudication Stable. Has a history of 3 back surgeries (last microdiscectomy of L4-5 in 2016). Still some numbness in thighs. Most discomfort occurs when climbing a hill, otherwise, infrequent pain or weakness.  5. Complete atrioventricular block (HCC) Pacemaker in the left upper chest placed 11-29-10. Had ablation therapy for WPW at [facility] in 1991. Should continue routine follow up with cardiologist.

## 2016-10-24 NOTE — Patient Instructions (Signed)
Howard [name] , Thank you for taking time to come for your Medicare Wellness Visit. I appreciate your ongoing commitment to your health goals. Please review the following plan we discussed and let me know if I can assist you in the future.   These are the goals we discussed: Goals    . Increase water intake          Starting 10/24/16, I will increase my water intake to 3-4 glasses a day.       This is a list of the screening recommended for you and due dates:  Health Maintenance  Topic Date Due  . Hemoglobin A1C  10/19/2016  . Eye exam for diabetics  12/31/2016  . Complete foot exam   04/18/2017  . Pneumonia vaccines (2 of 2 - PPSV23) 04/18/2017  . Tetanus Vaccine  09/06/2021  . Flu Shot  Completed  . Shingles Vaccine  Completed   Preventive Care for Adults  A healthy lifestyle and preventive care can promote health and wellness. Preventive health guidelines for adults include the following key practices.  . A routine yearly physical is a good way to check with your health care provider about your health and preventive screening. It is a chance to share any concerns and updates on your health and to receive a thorough exam.  . Visit your dentist for a routine exam and preventive care every 6 months. Brush your teeth twice a day and floss once a day. Good oral hygiene prevents tooth decay and gum disease.  . The frequency of eye exams is based on your age, health, family medical history, use  of contact lenses, and other factors. Follow your health care provider's ecommendations for frequency of eye exams.  . Eat a healthy diet. Foods like vegetables, fruits, whole grains, low-fat dairy products, and lean protein foods contain the nutrients you need without too many calories. Decrease your intake of foods high in solid fats, added sugars, and salt. Eat the right amount of calories for you. Get information about a proper diet from your health care provider, if necessary.  . Regular  physical exercise is one of the most important things you can do for your health. Most adults should get at least 150 minutes of moderate-intensity exercise (any activity that increases your heart rate and causes you to sweat) each week. In addition, most adults need muscle-strengthening exercises on 2 or more days a week.  Silver Sneakers may be a benefit available to you. To determine eligibility, you may visit the website: www.silversneakers.com or contact program at (317)626-5002 Mon-Fri between 8AM-8PM.   . Maintain a healthy weight. The body mass index (BMI) is a screening tool to identify possible weight problems. It provides an estimate of body fat based on height and weight. Your health care provider can find your BMI and can help you achieve or maintain a healthy weight.   For adults 20 years and older: ? A BMI below 18.5 is considered underweight. ? A BMI of 18.5 to 24.9 is normal. ? A BMI of 25 to 29.9 is considered overweight. ? A BMI of 30 and above is considered obese.   . Maintain normal blood lipids and cholesterol levels by exercising and minimizing your intake of saturated fat. Eat a balanced diet with plenty of fruit and vegetables. Blood tests for lipids and cholesterol should begin at age 48 and be repeated every 5 years. If your lipid or cholesterol levels are high, you are over 50, or you  are at high risk for heart disease, you may need your cholesterol levels checked more frequently. Ongoing high lipid and cholesterol levels should be treated with medicines if diet and exercise are not working.  . If you smoke, find out from your health care provider how to quit. If you do not use tobacco, please do not start.  . If you choose to drink alcohol, please do not consume more than 2 drinks per day. One drink is considered to be 12 ounces (355 mL) of beer, 5 ounces (148 mL) of wine, or 1.5 ounces (44 mL) of liquor.  . If you are 66-85 years old, ask your health care provider if  you should take aspirin to prevent strokes.  . Use sunscreen. Apply sunscreen liberally and repeatedly throughout the day. You should seek shade when your shadow is shorter than you. Protect yourself by wearing long sleeves, pants, a wide-brimmed hat, and sunglasses year round, whenever you are outdoors.  . Once a month, do a whole body skin exam, using a mirror to look at the skin on your back. Tell your health care provider of new moles, moles that have irregular borders, moles that are larger than a pencil eraser, or moles that have changed in shape or color.

## 2016-10-24 NOTE — Progress Notes (Signed)
Subjective:   Howard [name] is a 82 y.o. male who presents for Medicare Annual/Subsequent preventive examination.  Review of Systems:  N/A Cardiac Risk Factors include: advanced age (>59mn, >>1women);diabetes mellitus;dyslipidemia;hypertension     Objective:    Vitals: BP 122/69 (BP Location: Left Arm)   Pulse 68   Temp 98 F (36.7 C) (Oral)   Ht 5' 6.75" (1.695 m)   Wt 195 lb 6 oz (88.6 kg)   BMI 30.83 kg/m   Body mass index is 30.83 kg/m.  Tobacco History  Smoking Status  . Former Smoker  . Years: 4.00  Smokeless Tobacco  . Never Used    Comment: Quit 2011     Counseling given: Not Answered   Past Medical History:  Diagnosis Date  . Arthritis   . Bell palsy   . Cancer ([facility]    prostate and skin  . Chronic combined systolic and diastolic CHF, NYHA class 1 ([place]    a. 07/2014 Echo: EF 35-40%, Gr 1 DD.  .Marland KitchenComplete heart block ([place]    a. 11/2010 s/p SJM 2210 [employer] DC PPM, ser# [id number]  . Depression   . Diabetes mellitus without complication ([place]   . Fall 11-10-14  . GERD (gastroesophageal reflux disease)   . History of prostate cancer   . Hyperlipidemia   . Hypertension   . LBBB (left bundle branch block)   . Left-sided Bell's palsy   . Lung cancer ([place] 2016  . NICM ([place]ischemic cardiomyopathy) ([place]    a. 07/2014 Echo: EF 35-40%, mid-apicalanteroseptal DK, Gr 1 DD, mild-mod dil LA.  . Non-obstructive CAD    a. 07/2014 Abnl MV;  b. 08/2014 Cath: LM nl, LAD 30p, RI 40p, LCX nl, OM1 40, RCA dominant 30p, 70d-->Med Rx.  .Marland KitchenPoor balance   . Presence of permanent cardiac pacemaker   . Sleep apnea    a. cpap  . Vertigo   . WPW (Wolff-Parkinson-White syndrome)    a. S/P RFCA 1991.   Past Surgical History:  Procedure Laterality Date  . APPLICATION OF WOUND VAC Left 06/07/2015   Procedure: APPLICATION OF WOUND VAC;  Surgeon: JRobert Bellow MD;  Location: [facility] ORS;  Service: General;  Laterality: Left;  left upper back  . BACK SURGERY     2011  .  CARDIAC CATHETERIZATION  08/26/2014   Single vessel obstructive CAD  . CARPAL TUNNEL RELEASE  04-04-15   [facility]  . CATARACT EXTRACTION  07-31-11 and 09-18-11  . Catheter ablation  1991   for WPW  . cervical fusion    . CHOLECYSTECTOMY  09-07-14  . HAND SURGERY     right 1993; left 2005  . HERNIA REPAIR  1955  . INSERT / REPLACE / REMOVE PACEMAKER    . JOINT REPLACEMENT Left 2013   knee  . JOINT REPLACEMENT Right 2004   knee  . KNEE SURGERY     left knee 1991 and 1992; right knee 1995  . LEFT HEART CATHETERIZATION WITH CORONARY ANGIOGRAM N/A 08/26/2014   Procedure: LEFT HEART CATHETERIZATION WITH CORONARY ANGIOGRAM;  Surgeon: Peter M JMartinique MD;  [facility];  Service: Cardiovascular;  Laterality: N/A;  . LUMBAR LAMINECTOMY/DECOMPRESSION MICRODISCECTOMY N/A 06/07/2014   Procedure: LUMBAR FOUR TO FIVE LUMBAR LAMINECTOMY/DECOMPRESSION MICRODISCECTOMY 1 LEVEL;  Surgeon: HCharlie Pitter MD;  [facility];  Service: Neurosurgery;  Laterality: N/A;  . LUNG BIOPSY Right 2016   Dr OGenevive Bi . PACEMAKER INSERTION  PPM-- St Jude 11/30/10 by [name]  . PROSTATE SURGERY     cancer--1998, prostatectomy  . REPLACEMENT TOTAL KNEE     2004  . ruptured disc     1962 and 1998  . TRIGGER FINGER RELEASE  01-24-15  . WOUND DEBRIDEMENT Left 06/07/2015   Procedure: DEBRIDEMENT WOUND;  Surgeon: Robert Bellow, MD;  Location: [facility] ORS;  Service: General;  Laterality: Left;  left upper back   Family History  Problem Relation Age of Onset  . Heart attack Mother   . Hyperlipidemia Mother   . CAD    . Prostate cancer Neg Hx    History  Sexual Activity  . Sexual activity: Not on file    Outpatient Encounter Prescriptions as of 10/24/2016  Medication Sig  . amLODipine (NORVASC) 5 MG tablet Take 5 mg by mouth daily.  Marland Kitchen aspirin 81 MG tablet Take 81 mg by mouth daily.    . Cholecalciferol (VITAMIN D3) 1000 UNITS CAPS Take 1,000 Units by mouth daily.   . Colesevelam HCl (WELCHOL) 3.75 G PACK Take  3.75 g by mouth daily. Mix in [place] of water and drink.  . hydrochlorothiazide (HYDRODIURIL) 25 MG tablet Take 25 mg by mouth daily.  Marland Kitchen lisinopril (PRINIVIL,ZESTRIL) 10 MG tablet Take 10 mg by mouth daily.  . metFORMIN (GLUCOPHAGE) 500 MG tablet Take by mouth 2 (two) times daily with a meal.  . metoprolol succinate (TOPROL-XL) 25 MG 24 hr tablet Take 25 mg by mouth daily.  . Multiple Vitamin (MULTIVITAMIN) capsule Take 1 capsule by mouth daily.    Marland Kitchen omeprazole (PRILOSEC) 40 MG capsule Take 1 capsule (40 mg total) by mouth 2 (two) times daily.  . simvastatin (ZOCOR) 40 MG tablet Take 1 tablet (40 mg total) by mouth at bedtime.   No facility-administered encounter medications on file as of 10/24/2016.     Activities of Daily Living In your present state of health, do you have any difficulty performing the following activities: 10/24/2016 04/18/2016  Hearing? Tempie Donning  Vision? N N  Difficulty concentrating or making decisions? N N  Walking or climbing stairs? Y Y  Dressing or bathing? N N  Doing errands, shopping? N N  Preparing Food and eating ? N -  Using the Toilet? N -  In the past six months, have you accidently leaked urine? Y -  Do you have problems with loss of bowel control? Y -  Managing your Medications? N -  Managing your Finances? N -  Housekeeping or managing your Housekeeping? N -  Some recent data might be hidden    Patient Care Team: Margo Common, PA as PCP - General (Family Medicine) Robert Bellow, MD (General Surgery) [name], MD as Consulting Physician (Cardiology) [name], MD as Consulting Physician (Cardiology) Kennieth Francois, MD as Consulting Physician (Dermatology) Horald Pollen, Student-RN as Student Nurse (Student) Birder Robson, MD as Referring Physician (Ophthalmology)   Assessment:     Exercise Activities and Dietary recommendations Current Exercise Habits: Structured exercise class, Type of exercise: Other - see comments  (therapy at [facility]), Time (Minutes): 40, Frequency (Times/Week): 3, Weekly Exercise (Minutes/Week): 120, Intensity: Mild  Goals    . Increase water intake          Starting 10/24/16, I will increase my water intake to 3-4 glasses a day.      Fall Risk Fall Risk  10/24/2016 04/18/2016 01/15/2016 12/15/2015  Falls in the past year? No No No Yes  Number falls in past yr: - - - 1  Injury with Fall? - - - Yes  Risk for fall due to : Other (Comment);History of fall(s) - - -  Risk for fall due to (comments): Hx of Vertigo - - -   Depression Screen PHQ 2/9 Scores 10/24/2016 04/18/2016 01/15/2016  PHQ - 2 Score 0 0 0    Cognitive Function     6CIT Screen 10/24/2016  What Year? 0 points  What month? 0 points  What time? 0 points  Count back from 20 0 points  Months in reverse 0 points  Repeat phrase 6 points  Total Score 6    Immunization History  Administered Date(s) Administered  . Influenza, High Dose Seasonal PF 09/15/2015  . Influenza-Unspecified 08/23/2013  . Pneumococcal Conjugate-13 04/18/2016  . Tdap 09/07/2011  . Zoster 11/22/2013   Screening Tests Health Maintenance  Topic Date Due  . HEMOGLOBIN A1C  10/19/2016  . OPHTHALMOLOGY EXAM  12/31/2016  . FOOT EXAM  04/18/2017  . PNA vac Low Risk Adult (2 of 2 - PPSV23) 04/18/2017  . TETANUS/TDAP  09/06/2021  . INFLUENZA VACCINE  Completed  . ZOSTAVAX  Completed      Plan:  I have personally reviewed and addressed the Medicare Annual Wellness questionnaire and have noted the following in the patient's chart:  A. Medical and social history B. Use of alcohol, tobacco or illicit drugs  C. Current medications and supplements D. Functional ability and status E.  Nutritional status F.  Physical activity G. Advance directives H. List of other physicians I.  Hospitalizations, surgeries, and ER visits in previous 12 months J.  [place] such as hearing and vision if needed, cognitive and depression L. Referrals and  appointments - none  In addition, I have reviewed and discussed with patient certain preventive protocols, quality metrics, and best practice recommendations. A written personalized care plan for preventive services as well as general preventive health recommendations were provided to patient.  See attached scanned questionnaire for additional information.   Signed,  Fabio Neighbors, LPN Nurse Health Advisor  I reviewed evaluation by Nurse Health Advisor and agree with the [facility] Annual Wellness Screening. Agree with evaluation and recommendations.

## 2016-10-25 ENCOUNTER — Telehealth: Payer: Self-pay

## 2016-10-25 LAB — CBC WITH DIFFERENTIAL/PLATELET
BASOS ABS: 0.1 10*3/uL (ref 0.0–0.2)
Basos: 1 %
EOS (ABSOLUTE): 0.6 10*3/uL — ABNORMAL HIGH (ref 0.0–0.4)
EOS: 10 %
HEMOGLOBIN: 13.6 g/dL (ref 12.6–17.7)
Hematocrit: 40.4 % (ref 37.5–51.0)
IMMATURE GRANS (ABS): 0 10*3/uL (ref 0.0–0.1)
Immature Granulocytes: 0 %
LYMPHS: 34 %
Lymphocytes Absolute: 1.9 10*3/uL (ref 0.7–3.1)
MCH: 25.9 pg — AB (ref 26.6–33.0)
MCHC: 33.7 g/dL (ref 31.5–35.7)
MCV: 77 fL — ABNORMAL LOW (ref 79–97)
MONOCYTES: 13 %
Monocytes Absolute: 0.7 10*3/uL (ref 0.1–0.9)
NEUTROS ABS: 2.4 10*3/uL (ref 1.4–7.0)
Neutrophils: 42 %
Platelets: 225 10*3/uL (ref 150–379)
RBC: 5.26 x10E6/uL (ref 4.14–5.80)
RDW: 15.9 % — ABNORMAL HIGH (ref 12.3–15.4)
WBC: 5.7 10*3/uL (ref 3.4–10.8)

## 2016-10-25 LAB — LIPID PANEL
CHOL/HDL RATIO: 3.1 ratio (ref 0.0–5.0)
Cholesterol, Total: 135 mg/dL (ref 100–199)
HDL: 44 mg/dL (ref >39)
LDL CALC: 67 mg/dL (ref 0–99)
TRIGLYCERIDES: 122 mg/dL (ref 0–149)
VLDL Cholesterol Cal: 24 mg/dL (ref 5–40)

## 2016-10-25 LAB — COMPREHENSIVE METABOLIC PANEL
ALBUMIN: 4.3 g/dL (ref 3.5–4.7)
ALT: 30 IU/L (ref 0–44)
AST: 26 IU/L (ref 0–40)
Albumin/Globulin Ratio: 1.6 (ref 1.2–2.2)
Alkaline Phosphatase: 59 IU/L (ref 39–117)
BUN / CREAT RATIO: 16 (ref 10–24)
BUN: 13 mg/dL (ref 8–27)
Bilirubin Total: 0.6 mg/dL (ref 0.0–1.2)
CO2: 24 mmol/L (ref 18–29)
CREATININE: 0.81 mg/dL (ref 0.76–1.27)
Calcium: 9.3 mg/dL (ref 8.6–10.2)
Chloride: 93 mmol/L — ABNORMAL LOW (ref 96–106)
GFR calc non Af Amer: 82 mL/min/{1.73_m2} (ref >59)
GFR, EST AFRICAN AMERICAN: 95 mL/min/{1.73_m2} (ref >59)
GLUCOSE: 122 mg/dL — AB (ref 65–99)
Globulin, Total: 2.7 g/dL (ref 1.5–4.5)
Potassium: 4.3 mmol/L (ref 3.5–5.2)
Sodium: 134 mmol/L (ref 134–144)
TOTAL PROTEIN: 7 g/dL (ref 6.0–8.5)

## 2016-10-25 LAB — HEMOGLOBIN A1C
Est. average glucose Bld gHb Est-mCnc: 180 mg/dL
Hgb A1c MFr Bld: 7.9 % — ABNORMAL HIGH (ref 4.8–5.6)

## 2016-10-25 LAB — TSH: TSH: 0.967 u[IU]/mL (ref 0.450–4.500)

## 2016-10-25 NOTE — Telephone Encounter (Signed)
Patient has a hard time hearing so advised wife of lab report. KW

## 2016-10-25 NOTE — Telephone Encounter (Signed)
-----   Message from Margo Common, [place] sent at 10/25/2016 10:11 AM EDT ----- Cholesterol in very good shape. Hgb A1C elevating which indicates average sugar control not the best. Need to watch diet better and be sure to take Metformin BID. Recheck levels in 3 months. May need to increase dosage if Hgb A1C still up.

## 2016-10-28 ENCOUNTER — Ambulatory Visit
Admission: RE | Admit: 2016-10-28 | Discharge: 2016-10-28 | Disposition: A | Discharge status: Home / Self Care | Payer: Medicare Other | Source: Ambulatory Visit | Attending: Radiation Oncology | Admitting: Radiation Oncology

## 2016-10-28 ENCOUNTER — Ambulatory Visit: Payer: Medicare Other | Admitting: Radiation Oncology

## 2016-10-28 ENCOUNTER — Encounter: Payer: Self-pay | Admitting: Radiation Oncology

## 2016-10-28 ENCOUNTER — Other Ambulatory Visit: Payer: Self-pay | Admitting: *Deleted

## 2016-10-28 VITALS — BP 156/74 | HR 74 | Temp 96.3°F | Resp 20 | Wt 194.6 lb

## 2016-10-28 DIAGNOSIS — C3411 Malignant neoplasm of upper lobe, right bronchus or lung: Secondary | ICD-10-CM

## 2016-10-28 DIAGNOSIS — Z87891 Personal history of nicotine dependence: Secondary | ICD-10-CM | POA: Diagnosis not present

## 2016-10-28 DIAGNOSIS — I509 Heart failure, unspecified: Secondary | ICD-10-CM | POA: Diagnosis not present

## 2016-10-28 DIAGNOSIS — Z85828 Personal history of other malignant neoplasm of skin: Secondary | ICD-10-CM | POA: Diagnosis not present

## 2016-10-28 DIAGNOSIS — Z923 Personal history of irradiation: Secondary | ICD-10-CM | POA: Diagnosis not present

## 2016-10-28 DIAGNOSIS — C3412 Malignant neoplasm of upper lobe, left bronchus or lung: Secondary | ICD-10-CM | POA: Diagnosis not present

## 2016-10-28 NOTE — Progress Notes (Signed)
Radiation Oncology Follow up Note  Name: Howard [name]   Date:   10/28/2016 MRN:  [id number] DOB: 11/08/1931    This 82 y.o. male presents to the clinic today for follow up of his stage I right lung cancer s/p SBRT treatment completed 12/11/15.  REFERRING PROVIDER: Margo Common, PA  HPI: Patient is a 82 year old male now 11 months out from completing SBRT primary treatment to his right upper lobe for a biopsy-proven adenocarcinoma with acinar pattern. He presented with an abnormal XRay and was found to have a 1.3 cm spiculated mass in the anterior right lung proven to be cancer on biopsy performed 10/26/15. He underwent localized radiation with SBRT and did well with treatment. His last follow-up showed decreased size in mass and no new lesions.  He is seen today in routine follow-up with repeat CT chest prior to this visit. He denies chest wall pain, increased cough, shortness of breath, or hemoptysis. His health has been stable with known cardiac disease, pacemaker dependent, and multiple non-melenoma skin cancers followed by dermatology. He is a former smoker, quit many years ago.  COMPLICATIONS OF TREATMENT: none  FOLLOW UP COMPLIANCE: keeps appointments   PHYSICAL EXAM:  BP (!) 156/74   Pulse 74   Temp (!) 96.3 F (35.7 C)   Resp 20   Wt 194 lb 8.9 oz (88.3 kg)   BMI 30.70 kg/m   Well-developed well-nourished man in NAD. He is a good historian and alert, oriented x 3. Face symmetric. Voice normal.  Neck exam shows no evidence of cervical or supraclavicular adenopathy. Pacemaker left lateral subclavian.  Lungs are clear bilaterally to A&P. Cardiac examination shows regular rate, rhythm without murmur, gallop, or rub. Abdomen is benign with no organomegaly or masses noted. He has a hernia upper central abdomen above umbilicus, easily reducible. He is ambulatory without assist and has symmetric strength upper/lower extremities. No lower extremity edema noted. No focal boney  tenderness. Multiple actinic changes and scars on skin of back, arms, face compatible with prior dermatologic procedures and removal of sebaceous cyst left back previously.   RADIOLOGY RESULTS: CT chest with contrast from 10/10/16 shows no obvious mass remaining and expected post radiation changes posterior to this in right lung, No adenopathy. No new or suspicious lung lesions.   PLAN: He is clinically without evidence of disease 11 months post treatment for stage I right lung cancer and doing well. I have requested he return in followup with Dr. Donella Stade in 6 months with CT chest prior to that appointment. He knows to call or come in sooner should any problems arise in the interim related to this.

## 2016-11-09 ENCOUNTER — Other Ambulatory Visit: Payer: Self-pay | Admitting: Family Medicine

## 2016-11-11 DIAGNOSIS — J208 Acute bronchitis due to other specified organisms: Secondary | ICD-10-CM | POA: Diagnosis not present

## 2016-11-26 DIAGNOSIS — D0461 Carcinoma in situ of skin of right upper limb, including shoulder: Secondary | ICD-10-CM | POA: Diagnosis not present

## 2016-11-26 DIAGNOSIS — L905 Scar conditions and fibrosis of skin: Secondary | ICD-10-CM | POA: Diagnosis not present

## 2016-11-26 DIAGNOSIS — C44622 Squamous cell carcinoma of skin of right upper limb, including shoulder: Secondary | ICD-10-CM | POA: Diagnosis not present

## 2016-12-12 ENCOUNTER — Encounter: Payer: Self-pay | Admitting: Family Medicine

## 2016-12-17 ENCOUNTER — Other Ambulatory Visit: Payer: Self-pay | Admitting: *Deleted

## 2016-12-17 ENCOUNTER — Telehealth: Payer: Self-pay | Admitting: Cardiovascular Disease

## 2016-12-17 MED ORDER — SIMVASTATIN 40 MG PO TABS: 40.0000 mg | ORAL_TABLET | Freq: Every day | ORAL | 3 refills | Status: DC

## 2016-12-17 MED ORDER — SIMVASTATIN 40 MG PO TABS: 40.0000 mg | ORAL_TABLET | Freq: Every day | ORAL | 1 refills | Status: DC

## 2016-12-17 NOTE — Telephone Encounter (Signed)
Requested Prescriptions   Signed Prescriptions Disp Refills  . simvastatin (ZOCOR) 40 MG tablet 14 tablet 1    Sig: Take 1 tablet (40 mg total) by mouth at bedtime.    Authorizing Provider: [name]    Ordering User: Britt Bottom

## 2016-12-17 NOTE — Telephone Encounter (Signed)
Simvastatin #90 day and #14 day refill sent to mail order and local pharmacy.

## 2016-12-17 NOTE — Telephone Encounter (Signed)
°*  STAT* If patient is at the pharmacy, call can be transferred to refill team.   1. Which medications need to be refilled? (please list name of each medication and dose if known) simvastatin   2. Which pharmacy/location (including street and city if local pharmacy) is medication to be sent to? [facility]   3. Do they need a 30 day or 90 day supply? 90 day    Pt wife asking if we can send a small prescription for [place] so they can have some until mail order comes to them

## 2017-01-01 ENCOUNTER — Ambulatory Visit (INDEPENDENT_AMBULATORY_CARE_PROVIDER_SITE_OTHER): Payer: Medicare Other | Admitting: *Deleted

## 2017-01-01 DIAGNOSIS — I442 Atrioventricular block, complete: Secondary | ICD-10-CM

## 2017-01-01 NOTE — Progress Notes (Signed)
Remote pacemaker transmission.   

## 2017-01-02 ENCOUNTER — Encounter: Payer: Self-pay | Admitting: Cardiology

## 2017-01-08 ENCOUNTER — Other Ambulatory Visit: Payer: Self-pay | Admitting: Family Medicine

## 2017-01-09 ENCOUNTER — Other Ambulatory Visit: Payer: Self-pay | Admitting: Internal Medicine

## 2017-01-10 NOTE — Telephone Encounter (Signed)
Is Marion Downer, CMA from Dr[facility] office and was this medication stopped to get a CT scan with contrast? Usually this is stopped for 2-3 days after the scan to let the contrast clear then restart the Metformin.

## 2017-01-10 NOTE — Telephone Encounter (Signed)
Ask Dr[facility] office if she works there and did they want him to stop the Metformin before we refill it.

## 2017-01-10 NOTE — Telephone Encounter (Signed)
LMTCB at Dr. Olen[facility] Leatherwood office

## 2017-01-10 NOTE — Telephone Encounter (Signed)
Patient's wife said patient did not stop Metformin when having the CT scan.

## 2017-01-16 LAB — CUP PACEART REMOTE DEVICE CHECK
Battery Remaining Longevity: 41 mo
Battery Remaining Percentage: 46 %
Brady Statistic AP VS Percent: 1 %
Brady Statistic AS VP Percent: 96 %
Brady Statistic AS VS Percent: 1 %
Implantable Lead Location: 753859
Implantable Lead Location: 753860
Lead Channel Impedance Value: 430 Ohm
Lead Channel Pacing Threshold Amplitude: 0.5 V
Lead Channel Pacing Threshold Pulse Width: 0.4 ms
Lead Channel Pacing Threshold Pulse Width: 0.8 ms
Lead Channel Sensing Intrinsic Amplitude: 12 mV
Lead Channel Setting Pacing Amplitude: 2 V
Lead Channel Setting Pacing Amplitude: 2.5 V
Lead Channel Setting Pacing Pulse Width: 0.8 ms
MDC IDC LEAD IMPLANT DT: 20111208
MDC IDC LEAD IMPLANT DT: 20111208
MDC IDC MSMT BATTERY VOLTAGE: 2.86 V
MDC IDC MSMT LEADCHNL RA PACING THRESHOLD AMPLITUDE: 0.75 V
MDC IDC MSMT LEADCHNL RA SENSING INTR AMPL: 3.3 mV
MDC IDC MSMT LEADCHNL RV IMPEDANCE VALUE: 350 Ohm
MDC IDC PG IMPLANT DT: 20111208
MDC IDC SESS DTM: 20180110070011
MDC IDC SET LEADCHNL RV SENSING SENSITIVITY: 4 mV
MDC IDC STAT BRADY AP VP PERCENT: 3.7 %
MDC IDC STAT BRADY RA PERCENT PACED: 3.3 %
MDC IDC STAT BRADY RV PERCENT PACED: 99 %
Pulse Gen Model: 2210
Pulse Gen Serial Number: 7196739

## 2017-01-20 ENCOUNTER — Other Ambulatory Visit: Payer: Self-pay | Admitting: Family Medicine

## 2017-01-31 ENCOUNTER — Other Ambulatory Visit: Payer: Self-pay

## 2017-02-18 ENCOUNTER — Other Ambulatory Visit: Payer: Self-pay

## 2017-02-18 ENCOUNTER — Ambulatory Visit (INDEPENDENT_AMBULATORY_CARE_PROVIDER_SITE_OTHER): Payer: Medicare Other | Admitting: Family Medicine

## 2017-02-18 ENCOUNTER — Encounter: Payer: Self-pay | Admitting: Family Medicine

## 2017-02-18 VITALS — BP 148/70 | HR 65 | Temp 97.9°F | Resp 14 | Wt 196.4 lb

## 2017-02-18 DIAGNOSIS — I2510 Atherosclerotic heart disease of native coronary artery without angina pectoris: Secondary | ICD-10-CM | POA: Diagnosis not present

## 2017-02-18 DIAGNOSIS — C3411 Malignant neoplasm of upper lobe, right bronchus or lung: Secondary | ICD-10-CM

## 2017-02-18 DIAGNOSIS — E782 Mixed hyperlipidemia: Secondary | ICD-10-CM

## 2017-02-18 DIAGNOSIS — I10 Essential (primary) hypertension: Secondary | ICD-10-CM

## 2017-02-18 DIAGNOSIS — K2100 Gastro-esophageal reflux disease with esophagitis, without bleeding: Secondary | ICD-10-CM

## 2017-02-18 DIAGNOSIS — E119 Type 2 diabetes mellitus without complications: Secondary | ICD-10-CM | POA: Diagnosis not present

## 2017-02-18 NOTE — Progress Notes (Signed)
Patient: Howard [name] Male    DOB: 06/02/1931   83 y.o.   MRN: [id number] Visit Date: 02/18/2017  Today's Provider: [name], PA   Chief Complaint  Patient presents with  . Diabetes  . Hypertension  . Hyperlipidemia  . Follow-up  . Cough   Subjective:      Diabetes Mellitus Type II, Follow-up:   Lab Results  Component Value Date   HGBA1C 7.9 (H) 10/24/2016   HGBA1C 7.5 (H) 04/19/2016   HGBA1C 7.1 (H) 09/18/2015   Last seen for diabetes 4 months ago.  Management since then includes Metformin 500 mg BID and Welchol 3.75 gm qd with diabetic diet. He reports fair compliance with treatment. Not taking Welchol He is not having side effects.  Current symptoms include none  Home blood sugar records: fasting range: 130-140's  Episodes of hypoglycemia? no   Current Insulin Regimen: none Weight trend: stable Current diet: restrict sweets Current exercise: exercise class at [facility]  ------------------------------------------------------------------------   Hypertension, follow-up:  BP Readings from Last 3 Encounters:  02/18/17 (!) 148/70  10/28/16 (!) 156/74  10/24/16 122/69    He was last seen for hypertension 4 months ago.  BP at that visit was 156/74. Management since that visit includes HCTZ 25 mg qd, Amlodipine 5 mg qd, Lisinopril 10 mg qd and Metoprolol-SL 25 mg qd.He reports good compliance with treatment. He is not having side effects.  He is exercising. He is adherent to low salt diet.   Outside blood pressures are not being checked. He is experiencing none.  Patient denies chest pain, chest pressure/discomfort, irregular heart beat and palpitations.   Cardiovascular risk factors include advanced age (older than 46 for men, 73 for women), diabetes mellitus, dyslipidemia, hypertension and male gender.  Use of agents associated with hypertension: none.   ------------------------------------------------------------------------    Lipid/Cholesterol,  Follow-up:   Last seen for this 4 months ago.  Management since that visit includes Tolerating Simvastatin 40 mg qd and Welchol 3.75 gm qd. Trying to follow low fat diet restrictions.  Last Lipid Panel:    Component Value Date/Time   CHOL 135 10/24/2016 1129   TRIG 122 10/24/2016 1129   HDL 44 10/24/2016 1129   CHOLHDL 3.1 10/24/2016 1129   CHOLHDL 3.7 11/30/2010 0506   VLDL 23 11/30/2010 0506   LDLCALC 67 10/24/2016 1129    He reports fair compliance with treatment. He is having side effects with Welchol. Patient reports he is unable to take medication.  Wt Readings from Last 3 Encounters:  02/18/17 196 lb 6.4 oz (89.1 kg)  10/28/16 194 lb 8.9 oz (88.3 kg)  10/24/16 195 lb 9.6 oz (88.7 kg)    ------------------------------------------------------------------------  Cough  This is a new problem. The current episode started yesterday. The problem has been unchanged. The cough is productive of blood-tinged sputum. Pertinent negatives include no chest pain, fever, heartburn, nasal congestion or postnasal drip. He has tried nothing for the symptoms.   Past Medical History:  Diagnosis Date  . Arthritis   . Bell palsy   . Cancer [facility])    prostate and skin  . Chronic combined systolic and diastolic CHF, NYHA class 1 ([place])    a. 07/2014 Echo: EF 35-40%, Gr 1 DD.  Marland Kitchen Complete heart block ([place])    a. 11/2010 s/p SJM 2210 [employer] DC PPM, ser# [id number].  . Depression   . Diabetes mellitus without complication ([place])   . Fall 11-10-14  . GERD (gastroesophageal reflux disease)   .  History of prostate cancer   . Hyperlipidemia   . Hypertension   . LBBB (left bundle branch block)   . Left-sided Bell's palsy   . Lung cancer ([place]) 2016  . NICM (nonischemic cardiomyopathy) ([place])    a. 07/2014 Echo: EF 35-40%, mid-apicalanteroseptal DK, Gr 1 DD, mild-mod dil LA.  . Non-obstructive CAD    a. 07/2014 Abnl MV;  b. 08/2014 Cath: LM nl, LAD 30p, RI 40p, LCX nl, OM1 40, RCA dominant 30p,  70d-->Med Rx.  Marland Kitchen Poor balance   . Presence of permanent cardiac pacemaker   . Sleep apnea    a. cpap  . Vertigo   . WPW (Wolff-Parkinson-White syndrome)    a. S/P RFCA 1991.   Patient Active Problem List   Diagnosis Date Noted  . Malignant neoplasm of right lung ([place]) 04/18/2016  . CAD (coronary artery disease) 04/01/2016  . Adenocarcinoma ([place]) 04/01/2016  . Skin cyst 12/26/2015  . GERD (gastroesophageal reflux disease) 06/16/2015  . Abscess of back 06/06/2015  . Vertigo 03/27/2015  . Carpal tunnel syndrome 10/28/2014  . Status post cholecystectomy 09/29/2014  . Disease of digestive tract 09/29/2014  . Nonischemic cardiomyopathy ([place]) 09/26/2014  . Cardiomyopathy ([place]) 09/26/2014  . Diabetes mellitus without complication ([place])   . Sleep apnea   . Spinal stenosis, lumbar region, with neurogenic claudication 06/07/2014  . Lumbar stenosis with neurogenic claudication 06/07/2014  . Abnormal gait 08/20/2012  . H/O total knee replacement 08/20/2012  . Arthritis of knee, degenerative 08/20/2012  . Pacemaker-St.Jude 08/03/2012  . Cardiac conduction disorder 06/19/2012  . Acid reflux 06/18/2012  . 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 Triggering of digit 03/25/2012  . Hyperlipidemia 12/23/2011  . Essential hypertension 03/23/2011  . Complete heart block ([place]) 03/23/2011  . Complete atrioventricular block ([place]) 03/23/2011   Past Surgical History:  Procedure Laterality Date  . APPLICATION OF WOUND VAC Left 06/07/2015   Procedure: APPLICATION OF WOUND VAC;  Surgeon: Robert Bellow, MD;  Location: [facility] ORS;  Service: General;  Laterality: Left;  left upper back  . BACK SURGERY     2011  . CARDIAC CATHETERIZATION  08/26/2014   Single vessel obstructive CAD  . CARPAL TUNNEL RELEASE  04-04-15   [facility]  . CATARACT EXTRACTION  07-31-11 and 09-18-11  . Catheter ablation  1991   for WPW  . cervical fusion    . CHOLECYSTECTOMY  09-07-14  . HAND SURGERY     right 1993; left 2005  . HERNIA  REPAIR  1955  . INSERT / REPLACE / REMOVE PACEMAKER    . JOINT REPLACEMENT Left 2013   knee  . JOINT REPLACEMENT Right 2004   knee  . KNEE SURGERY     left knee 1991 and 1992; right knee 1995  . LEFT HEART CATHETERIZATION WITH CORONARY ANGIOGRAM N/A 08/26/2014   Procedure: LEFT HEART CATHETERIZATION WITH CORONARY ANGIOGRAM;  Surgeon: [name], MD;  Location[facility] LAB;  Service: Cardiovascular;  Laterality: N/A;  . LUMBAR LAMINECTOMY/DECOMPRESSION MICRODISCECTOMY N/A 06/07/2014   Procedure: LUMBAR FOUR TO FIVE LUMBAR LAMINECTOMY/DECOMPRESSION MICRODISCECTOMY 1 LEVEL;  Surgeon: [name], MD;  Location: [place] NEURO ORS;  Service: Neurosurgery;  Laterality: N/A;  . LUNG BIOPSY Right 2016   Dr [name]  . PACEMAKER INSERTION     PPM-- St Jude 11/30/10 by [name]  . PROSTATE SURGERY     cancer--1998, prostatectomy  . REPLACEMENT TOTAL KNEE     2004  . ruptured disc     1962  and 1998  . TRIGGER FINGER RELEASE  01-24-15  . WOUND DEBRIDEMENT Left 06/07/2015   Procedure: DEBRIDEMENT WOUND;  Surgeon: Robert Bellow, MD;  Location: [facility] ORS;  Service: General;  Laterality: Left;  left upper back   Family History  Problem Relation Age of Onset  . Heart attack Mother   . Hyperlipidemia Mother   . CAD    . Prostate cancer Neg Hx    Allergies  Allergen Reactions  . Sulfonamide Derivatives Rash     Previous Medications   AMLODIPINE (NORVASC) 5 MG TABLET    TAKE 1 TABLET DAILY   ASPIRIN 81 MG TABLET    Take 81 mg by mouth daily.     CHOLECALCIFEROL (VITAMIN D3) 1000 UNITS CAPS    Take 1,000 Units by mouth daily.    COLESEVELAM HCL (WELCHOL) 3.75 G PACK    Take 3.75 g by mouth daily. Mix in [place] of water and drink.   HYDROCHLOROTHIAZIDE (HYDRODIURIL) 25 MG TABLET    TAKE 1 TABLET DAILY   LISINOPRIL (PRINIVIL,ZESTRIL) 10 MG TABLET    Take 10 mg by mouth daily.   METFORMIN (GLUCOPHAGE) 500 MG TABLET    TAKE 1 TABLET TWICE A DAY   METOPROLOL SUCCINATE (TOPROL-XL) 25 MG 24 HR TABLET    Take 25 mg  by mouth daily.   MULTIPLE VITAMIN (MULTIVITAMIN) CAPSULE    Take 1 capsule by mouth daily.     OMEPRAZOLE (PRILOSEC) 40 MG CAPSULE    Take 1 capsule (40 mg total) by mouth 2 (two) times daily.   SIMVASTATIN (ZOCOR) 40 MG TABLET    Take 1 tablet (40 mg total) by mouth at bedtime.    Review of Systems  Constitutional: Negative.  Negative for fever.  HENT: Negative for postnasal drip.   Respiratory: Positive for cough.   Cardiovascular: Negative.  Negative for chest pain.  Gastrointestinal: Negative for heartburn.    Social History  Substance Use Topics  . Smoking status: Former Smoker    Years: 4.00  . Smokeless tobacco: Never Used     Comment: Quit 2011  . Alcohol use 0.6 oz/week    1 Cans of beer per week     Comment: occasional   Objective:   BP (!) 148/70 (BP Location: Right Arm, Patient Position: Sitting, Cuff Size: Normal)   Pulse 65   Temp 97.9 F (36.6 C) (Oral)   Resp 14   Wt 196 lb 6.4 oz (89.1 kg)   SpO2 99%   BMI 30.99 kg/m   Physical Exam  Constitutional: He is oriented to person, place, and time. He appears well-developed and well-nourished.  HENT:  Head: Normocephalic.  Right Ear: External ear normal.  Left Ear: External ear normal.  Wearing hearing aids bilaterally.  Eyes: Conjunctivae and EOM are normal.  Neck: Neck supple. No JVD present.  Cardiovascular: Normal rate, regular rhythm and normal heart sounds.   Pulmonary/Chest: Effort normal and breath sounds normal. No respiratory distress. He has no wheezes. He has no rales. He exhibits no tenderness.  Abdominal: Soft. Bowel sounds are normal. There is no tenderness.  Musculoskeletal:  Well healed scars from microdiscectomies in 2015 and 2016 by Dr. Annette Stable. Has had right knee replacement in 2004 and the left knee replacement in 2013. All incisions well healed. Still has episodes of back pain and "legs locking up" without numbness or radiating pain. Suspect some neurogenic claudication. SLR's 90 degrees  without pain. Balance a little unstable and uses a cane for  support.  Lymphadenopathy:    He has no cervical adenopathy.  Neurological: He is alert and oriented to person, place, and time.  Unable to elicit DTR's throughout.  Psychiatric: He has a normal mood and affect. His behavior is normal. Thought content normal.      Assessment & Plan:     1. Diabetes mellitus without complication ([place])  Feels BS has been stable at 130-140 at home each morning. Tolerating Metformin and Welchol without side effects. May need to consider stopping the Metformin with his NYHA class 1 chronic sytolina d diastolic CHF and EF 39-76% on 2015 echocardiogram. Some slight decrease in right heel to test with monofilament today. No lesions on feet. No foot pain. Denies hypoglycemic episodes, polyuria, polydipsia or polyphagia. Plans ophthalmology exam in the next month. Recheck labs and follow up pending reports. - CBC with Differential/Platelet - Comprehensive metabolic panel - Hemoglobin A1c  2. Malignant neoplasm of upper lobe of right lung [facility]) Patient is a 83 year old male who was seen for surgery for an infected sebaceous cyst of his left posterior chest wall which required inpatient excision and wound VAC placement in 2016. During hospitalization he had chest discomfort chest x-ray identified a suspicious lesion and CT scan confirmed a right upper and right lower lobe nodules. Right lower lobe nodule hepatic calcified center consistent with granuloma. Right upper lobe had increased slightly in size of her prior CT scan 1 year prior. PET CT scan was performed showing mild hypermetabolic activity in the right upper lobe and CT-guided biopsy was performed showing adenocarcinoma with acinar pattern. Patient had a discussion with surgical oncology and  declined surgical intervention. Completed SB RT treatments by Dr. Baruch Gouty (radiation oncologist) in December of 2016. CT scan in April 2017 showed decrease in size of  adenocarcinoma in the right upper lobe associated with a good response. Lesion gone per CT of October 2017 with adjacent post radiation fibrosis in the interval. Scheduled for follow up CT chest with contrast on 04-16-17. With some blood streaked sputum yesterday, may need to follow up sooner with oncologist. Essential asymptomatic without recurrence today. Will plan follow up pending lab reports. - CBC with Differential/Platelet - Comprehensive metabolic panel  3. Gastroesophageal reflux disease with esophagitis Well controlled with use of Omeprazole 40 mg BID. Denies hematemesis or melena. No abdominal or esophageal pain. No dysphagia. Check CBC for anemia. - CBC with Differential/Platelet  4. Mixed hyperlipidemia Tolerating Simvastatin 40 mg qd, but, has forgotten to take the Welchol. Will continue low fat diet and recheck labs. - Comprehensive metabolic panel - Lipid panel - TSH  5. Essential hypertension Good control with Metoprolol Succinate 25 mg qd, Lisinopril 10 mg qd, HCTZ 25 mg qd and Amlodipine 5 mg qd. Recheck routine labs and follow up pending reports. - CBC with Differential/Platelet - Comprehensive metabolic panel - TSH  6. Coronary artery disease involving native coronary artery of native heart without angina pectoris History of complete heart block, status post pacemaker in 2011, non-ischemic cardiomyopathy, coronary artery disease, 70% distal RCA disease, 30 and 40% lesions in his ramus, LAD. Asymptomatic today and follow by Dr. [name] regularly. Denies palpitations, dyspnea or angina.

## 2017-02-19 LAB — CBC WITH DIFFERENTIAL/PLATELET
Basophils Absolute: 0.1 10*3/uL (ref 0.0–0.2)
Basos: 1 %
EOS (ABSOLUTE): 0.6 10*3/uL — ABNORMAL HIGH (ref 0.0–0.4)
EOS: 9 %
HEMATOCRIT: 44.3 % (ref 37.5–51.0)
HEMOGLOBIN: 14.4 g/dL (ref 13.0–17.7)
IMMATURE GRANULOCYTES: 0 %
Immature Grans (Abs): 0 10*3/uL (ref 0.0–0.1)
LYMPHS ABS: 2.4 10*3/uL (ref 0.7–3.1)
Lymphs: 37 %
MCH: 25.6 pg — ABNORMAL LOW (ref 26.6–33.0)
MCHC: 32.5 g/dL (ref 31.5–35.7)
MCV: 79 fL (ref 79–97)
MONOCYTES: 12 %
Monocytes Absolute: 0.8 10*3/uL (ref 0.1–0.9)
NEUTROS PCT: 41 %
Neutrophils Absolute: 2.7 10*3/uL (ref 1.4–7.0)
Platelets: 248 10*3/uL (ref 150–379)
RBC: 5.63 x10E6/uL (ref 4.14–5.80)
RDW: 16.2 % — ABNORMAL HIGH (ref 12.3–15.4)
WBC: 6.6 10*3/uL (ref 3.4–10.8)

## 2017-02-19 LAB — COMPREHENSIVE METABOLIC PANEL
ALT: 26 IU/L (ref 0–44)
AST: 25 IU/L (ref 0–40)
Albumin/Globulin Ratio: 1.8 (ref 1.2–2.2)
Albumin: 4.7 g/dL (ref 3.5–4.7)
Alkaline Phosphatase: 61 IU/L (ref 39–117)
BUN/Creatinine Ratio: 12 (ref 10–24)
BUN: 11 mg/dL (ref 8–27)
Bilirubin Total: 0.5 mg/dL (ref 0.0–1.2)
CALCIUM: 9.6 mg/dL (ref 8.6–10.2)
CO2: 25 mmol/L (ref 18–29)
CREATININE: 0.93 mg/dL (ref 0.76–1.27)
Chloride: 93 mmol/L — ABNORMAL LOW (ref 96–106)
GFR calc Af Amer: 87 mL/min/{1.73_m2} (ref >59)
GFR, EST NON AFRICAN AMERICAN: 76 mL/min/{1.73_m2} (ref >59)
GLUCOSE: 111 mg/dL — AB (ref 65–99)
Globulin, Total: 2.6 g/dL (ref 1.5–4.5)
Potassium: 4.5 mmol/L (ref 3.5–5.2)
Sodium: 137 mmol/L (ref 134–144)
Total Protein: 7.3 g/dL (ref 6.0–8.5)

## 2017-02-19 LAB — LIPID PANEL
CHOLESTEROL TOTAL: 144 mg/dL (ref 100–199)
Chol/HDL Ratio: 3.1 ratio units (ref 0.0–5.0)
HDL: 46 mg/dL (ref >39)
LDL Calculated: 70 mg/dL (ref 0–99)
Triglycerides: 138 mg/dL (ref 0–149)
VLDL CHOLESTEROL CAL: 28 mg/dL (ref 5–40)

## 2017-02-19 LAB — TSH: TSH: 1.38 u[IU]/mL (ref 0.450–4.500)

## 2017-02-19 LAB — HEMOGLOBIN A1C
ESTIMATED AVERAGE GLUCOSE: 169 mg/dL
Hgb A1c MFr Bld: 7.5 % — ABNORMAL HIGH (ref 4.8–5.6)

## 2017-02-24 ENCOUNTER — Telehealth: Payer: Self-pay | Admitting: Family Medicine

## 2017-02-24 ENCOUNTER — Other Ambulatory Visit: Payer: Self-pay | Admitting: Family Medicine

## 2017-02-24 DIAGNOSIS — Z85828 Personal history of other malignant neoplasm of skin: Secondary | ICD-10-CM | POA: Diagnosis not present

## 2017-02-24 DIAGNOSIS — D485 Neoplasm of uncertain behavior of skin: Secondary | ICD-10-CM | POA: Diagnosis not present

## 2017-02-24 DIAGNOSIS — R42 Dizziness and giddiness: Secondary | ICD-10-CM

## 2017-02-24 DIAGNOSIS — D044 Carcinoma in situ of skin of scalp and neck: Secondary | ICD-10-CM | POA: Diagnosis not present

## 2017-02-24 DIAGNOSIS — L570 Actinic keratosis: Secondary | ICD-10-CM | POA: Diagnosis not present

## 2017-02-24 DIAGNOSIS — L905 Scar conditions and fibrosis of skin: Secondary | ICD-10-CM | POA: Diagnosis not present

## 2017-02-24 DIAGNOSIS — C44229 Squamous cell carcinoma of skin of left ear and external auricular canal: Secondary | ICD-10-CM | POA: Diagnosis not present

## 2017-02-24 DIAGNOSIS — X32XXXA Exposure to sunlight, initial encounter: Secondary | ICD-10-CM | POA: Diagnosis not present

## 2017-02-24 NOTE — Telephone Encounter (Signed)
Mrs. Rochin advised.  Please refer.  Thanks,   -Mickel Baas

## 2017-02-24 NOTE — Telephone Encounter (Signed)
See result note on labs in chart.

## 2017-02-24 NOTE — Telephone Encounter (Signed)
Pt wife [name] states pt has lab on 02/18/2017 and they are requesting the results.  LI#[id number]/[id number]

## 2017-02-24 NOTE — Telephone Encounter (Signed)
Schedule for physical therapy for vertigo.

## 2017-02-24 NOTE — Telephone Encounter (Signed)
Mrs. Erbes advised of lab results.    She reported that Mr. Tobon is having trouble with vertigo again.  She says he was a therapist in 2015 for the same issue and would like a referral back there.  (She does not remember the name of the therapist but they would like to see her again if possible.)  Thanks,   -Mickel Baas

## 2017-02-26 DIAGNOSIS — H60332 Swimmer's ear, left ear: Secondary | ICD-10-CM | POA: Diagnosis not present

## 2017-03-04 ENCOUNTER — Ambulatory Visit: Payer: Medicare Other | Attending: Family Medicine | Admitting: Physical Therapy

## 2017-03-04 ENCOUNTER — Encounter: Payer: Self-pay | Admitting: Physical Therapy

## 2017-03-04 DIAGNOSIS — R42 Dizziness and giddiness: Secondary | ICD-10-CM | POA: Diagnosis not present

## 2017-03-04 DIAGNOSIS — R262 Difficulty in walking, not elsewhere classified: Secondary | ICD-10-CM | POA: Diagnosis not present

## 2017-03-04 DIAGNOSIS — R296 Repeated falls: Secondary | ICD-10-CM | POA: Insufficient documentation

## 2017-03-04 NOTE — Therapy (Signed)
[place] MAIN [facility] SERVICES [address] [place], [place], [address] Phone: [phone]   Fax:  [phone]  Physical Therapy Evaluation  Patient Details  Name: Howard [name] MRN: [id number] Date of Birth: [birth year] Referring Provider: Dr. Pati Gallo  Encounter Date: 03/04/2017      PT End of Session - 03/04/17 1512    Visit Number 1   Number of Visits 14   Date for PT Re-Evaluation 04/29/17   PT Start Time 0130   PT Stop Time 0233   PT Time Calculation (min) 63 min   Equipment Utilized During Treatment Gait belt   Activity Tolerance Patient tolerated treatment well   Behavior During Therapy [facility] for tasks assessed/performed      Past Medical History:  Diagnosis Date  . Arthritis   . Bell palsy   . Cancer [facility])    prostate and skin  . Chronic combined systolic and diastolic CHF, NYHA class 1 ([place])    a. 07/2014 Echo: EF 35-40%, Gr 1 DD.  Marland Kitchen Complete heart block ([place])    a. 11/2010 s/p SJM 2210 [employer] DC PPM, ser# [id number].  . Depression   . Diabetes mellitus without complication ([place])   . Fall 11-10-14  . GERD (gastroesophageal reflux disease)   . History of prostate cancer   . Hyperlipidemia   . Hypertension   . LBBB (left bundle branch block)   . Left-sided Bell's palsy   . Lung cancer ([place]) 2016  . NICM (nonischemic cardiomyopathy) ([place])    a. 07/2014 Echo: EF 35-40%, mid-apicalanteroseptal DK, Gr 1 DD, mild-mod dil LA.  . Non-obstructive CAD    a. 07/2014 Abnl MV;  b. 08/2014 Cath: LM nl, LAD 30p, RI 40p, LCX nl, OM1 40, RCA dominant 30p, 70d-->Med Rx.  Marland Kitchen Poor balance   . Presence of permanent cardiac pacemaker   . Sleep apnea    a. cpap  . Vertigo   . WPW (Wolff-Parkinson-White syndrome)    a. S/P RFCA 1991.    Past Surgical History:  Procedure Laterality Date  . APPLICATION OF WOUND VAC Left 06/07/2015   Procedure: APPLICATION OF WOUND VAC;  Surgeon: Robert Bellow, MD;  Location: [facility] ORS;  Service: General;   Laterality: Left;  left upper back  . BACK SURGERY     2011  . CARDIAC CATHETERIZATION  08/26/2014   Single vessel obstructive CAD  . CARPAL TUNNEL RELEASE  04-04-15   [facility]  . CATARACT EXTRACTION  07-31-11 and 09-18-11  . Catheter ablation  1991   for WPW  . cervical fusion    . CHOLECYSTECTOMY  09-07-14  . HAND SURGERY     right 1993; left 2005  . HERNIA REPAIR  1955  . INSERT / REPLACE / REMOVE PACEMAKER    . JOINT REPLACEMENT Left 2013   knee  . JOINT REPLACEMENT Right 2004   knee  . KNEE SURGERY     left knee 1991 and 1992; right knee 1995  . LEFT HEART CATHETERIZATION WITH CORONARY ANGIOGRAM N/A 08/26/2014   Procedure: LEFT HEART CATHETERIZATION WITH CORONARY ANGIOGRAM;  Surgeon: [name], MD;  Location: [facility] CATH LAB;  Service: Cardiovascular;  Laterality: N/A;  . LUMBAR LAMINECTOMY/DECOMPRESSION MICRODISCECTOMY N/A 06/07/2014   Procedure: LUMBAR FOUR TO FIVE LUMBAR LAMINECTOMY/DECOMPRESSION MICRODISCECTOMY 1 LEVEL;  Surgeon: [name], MD;  Location: [place] NEURO ORS;  Service: Neurosurgery;  Laterality: N/A;  . LUNG BIOPSY Right 2016   Dr [name]  . PACEMAKER INSERTION  PPM-- St Jude 11/30/10 by [name]  . PROSTATE SURGERY     cancer--1998, prostatectomy  . REPLACEMENT TOTAL KNEE     2004  . ruptured disc     1962 and 1998  . TRIGGER FINGER RELEASE  01-24-15  . WOUND DEBRIDEMENT Left 06/07/2015   Procedure: DEBRIDEMENT WOUND;  Surgeon: Robert Bellow, MD;  Location: [facility] ORS;  Service: General;  Laterality: Left;  left upper back    There were no vitals filed for this visit.       Subjective Assessment - 03/04/17 1511    Subjective Patient states that he mostly feels off-balance at times and that he avoids positions that might bring on dizziness.    Pertinent History Patient states that "you got that stuff straightened out" in 2015 in reference to prior vestibular therapy for dizziness and vertigo. Patient states he had a fall on March 3rd when he got up at 3 am to let the  dog out. Patient states he does not remember whether he was dizzy or not prior to or after the fall. Patient reports this is why he is returning to vestibular therapy. Patient states he has not been having vertigo or dizziness, but rather balance issues primarily at present. Patient states he has not been using any AD for mobility.    Patient Stated Goals Patient would like to be able to install an overhead lightbulb, lie in the prone position to shoot a rifle, be able to shave his chin and be able to get on the floor and look up underneath a table   Currently in Pain? No/denies       VESTIBULAR AND BALANCE EVALUATION    HISTORY:   Subjective history of current problem:  Patient states that "you got that stuff straightened out" in 2015 in reference to prior vestibular therapy for dizziness and vertigo. Patient states he had a fall on March 3rd when he got up at 3 am to let the dog out. Patient states he does not remember whether he was dizzy or not prior to or after the fall. Patient reports this is why he is returning to vestibular therapy. Patient states he has not been having vertigo or dizziness, but rather balance issues primarily at present. Patient states he has not been using any AD for mobility.  Patient states that the last time he had a bad spell he was leaning forward with his head tilted back shooting. Patient also states he had another episode while tipping his head back to change a light bulb. Patient states he had to have a surgery on a cyst on his back June 07, 2015 and states he had an episode of vertigo at that time as well.   Description of dizziness: unsteadiness, falling, general unsteadiness; pt denies vertigo at present but does report that he has has a few intermittent episodes of vertigo and dizziness this past year. Frequency: patient having difficult time reporting. States he avoids movements that have brought on dizziness in the past.  Duration: seconds to  minutes Symptom nature: motion provoked, positional, intermittent  Provocative Factors: Patient states that he has avoided tipping his head back and bending over because that has brought on his symptoms in the past.   History of similar episodes: yes  Falls (yes/no): yes Number of falls in past 6 months: 1 fall and reports no episodes of near misses.   Prior Functional Level: independent with ADLs, independent community ambulator and drives  Auditory complaints (tinnitus, pain,  drainage): denies Vision (last eye exam, diplopia, recent changes): denies  Current Symptoms:  Review of systems negative for red flags.   EXAMINATION  POSTURE: rounded shoulders  NEUROLOGICAL SCREEN: (2+ unless otherwise noted.) N=normal  Ab=abnormal  Level Dermatome R L Myotome R L  C3 Anterior Neck N N Sidebend C2-3    C4 Top of Shoulder N N Shoulder Shrug C4 N N  C5 Lateral Upper Arm N N Shoulder ABD C4-5    C6 Lateral Arm/ Thumb N N Arm Flex/ Wrist Ext C5-6 N N  C7 Middle Finger N N Arm Ext//Wrist Flex C6-7 N N  C8 4th & 5th Finger N N Flex/ Ext Carpi Ulnaris C8    T1 Medial Arm N N Interossei T1    L2 Medial thigh/groin N N Illiopsoas (L2-3) N N  L3 Lower thigh/med.knee N N Quadriceps (L3-4) N N  L4 Medial leg/lat thigh N N Tibialis Ant (L4-5) N N  L5 Lat. leg & dorsal foot N N EHL (L5)    S1 post/lat foot/thigh/leg N N Gastrocnemius (S1-2)    S2 Post./med. thigh & leg N N Hamstrings (L4-S3)      SOMATOSENSORY:         Sensation           Intact      Diminished         Absent  Light touch NORMAL    Any N & T in extremities or weakness: denies      COORDINATION: Finger to Nose:  mild pass-pointing with right UE & normal left UE Past Pointing:    Right     MUSCULOSKELETAL SCREEN: Cervical Spine ROM: AROM cervical spine with decreased left/right rotation, WFL flexion/extension; patient has history of cervical fusion surgery 1984.   ROM: B UEs and LES grossly WFL.  MMT: Grossly +3/5  right and +4/5 left hip flexors otherwise 4/5 or greater quads and DFs. Grossly bilateral UEs 4/5 or greater throughout. Right shoulder flexor weaker than left shoulder flexor.   Functional Mobility:  Gait: Patient arrives to clinic ambulating without AD. Patient ambulates with decreased gait speed and step length. Patient demonstrates difficulty with ambulation with head turns and body turns.   Scanning of visual environment with gait is: poor  Balance: Patient demonstrates difficulty with narrow BOS, uneven surfaces, SLS and activities with head and body turns.   POSTURAL CONTROL TESTS:   Clinical Test of Sensory Interaction for Balance    (CTSIB):  CONDITION TIME STRATEGY SWAY  Eyes open, firm surface 30 seconds ankle +1  Eyes closed, firm surface 25 seconds ankle +2  Eyes open, foam surface 30 seconds ankle +2  Eyes closed, foam surface About 5 seconds ankle +4    OCULOMOTOR / VESTIBULAR TESTING:  Oculomotor Exam- Room Light  Normal Abnormal Comments  Ocular Alignment N    Ocular ROM N    Spontaneous Nystagmus N    End-Gaze Nystagmus N  Age appropriate at end range left and right fields  Smooth Pursuit N    Saccades  ABN Few hypometric saccades with left and right field testing  VOR   Deferred secondary to positive left Dix-Hallpike test  VOR Cancellation N    Left Head Thrust   Deferred secondary to positive left Dix-Hallpike test  Right Head Thrust   Deferred secondary to positive left Dix-Hallpike test  Head Shaking Nystagmus   Deferred secondary to positive left Dix-Hallpike test    BPPV TESTS:  Symptoms Duration Intensity Nystagmus  L Dix-Hallpike Vertigo Less than 1 minute 5/10 Left, upbeating torsional nystagmus of short duration  R Dix-Hallpike Deferred secondary to treated left ear       FUNCTIONAL OUTCOME MEASURES:  Results Comments  DHI 46 Moderate perception of handicap; in need of intervention  ABC Scale 62.5% Falls risk; in need of intervention   DGI 16/24 Falls risk; in need of intervention        [facility] PT Assessment - 03/04/17 1356      Assessment   Medical Diagnosis vertigo   Referring Provider Dr. Pati Gallo   Onset Date/Surgical Date 02/22/17   Prior Therapy vestibular therapy      Precautions   Precautions Fall     Restrictions   Weight Bearing Restrictions No     Balance Screen   Has the patient fallen in the past 6 months Yes   How many times? 1   Has the patient had a decrease in activity level because of a fear of falling?  Yes   Is the patient reluctant to leave their home because of a fear of falling?  No     Home Environment   Living Environment Private residence   Living Arrangements Spouse/significant other   Available Help at Discharge Family;Friend(s)   Type of [place] to enter   Entrance Stairs-Number of Steps 3 with bilateral rails   Entrance Stairs-Rails Right;Left;Can reach both   Home Layout Two level;Laundry or work area in basement   Alternate Therapist, sports of Steps 1 flight with bilateral rails to the basement     Prior Function   Level of Independence Independent with community mobility without device     Cognition   Overall Cognitive Status Within Functional Limits for tasks assessed     Standardized Balance Assessment   Standardized Balance Assessment Dynamic Gait Index     Dynamic Gait Index   Level Surface Normal   Change in Gait Speed Moderate Impairment   Gait with Horizontal Head Turns Mild Impairment   Gait with Vertical Head Turns Mild Impairment   Gait and Pivot Turn Mild Impairment  mild imbalance with left turn   Step Over Obstacle Moderate Impairment   Step Around Obstacles Normal   Steps Mild Impairment   Total Score 16       Canalith Repositioning:  Patient with limited cervical extension ROM therefore performed testing on inclined mat table. On inverted mat table performed left Dix-Hallpike testing and it was positive with  left up-beating, torsional nystagmus of short duration. Patient reported 5/10 vertigo. Patient with a few second second latency and then observed multiple beats of vigorous nystagmus. Performed left canalith repositioning maneuver (CRT). Repeated L CRT for a total of 2 maneuvers with retesting between maneuvers.        PT Education - 03/04/17 1511    Education provided Yes   Education Details Discussed BPPV and BPPV handout information provided   Person(s) Educated Patient   Methods Explanation;Demonstration;Handout;Verbal cues   Comprehension Verbalized understanding             PT Long Term Goals - 03/05/17 1014      PT LONG TERM GOAL #1   Title Patient will demonstrate reduced falls risk as evidenced by Dynamic Gait Index (DGI) >19/24 by 04/29/2017.   Baseline scored 16/24 on 03/04/2017   Time 8   Period Weeks   Status New     PT LONG TERM GOAL #2   Title Patient  will reduce perceived disability to low levels as indicated by <40 on Dizziness Handicap Inventory.   Baseline scored 46/100 on 03/04/2017   Time 8   Period Weeks   Status New     PT LONG TERM GOAL #3   Title Patient reports no vertigo with provoking motions or positions.   Time 8   Period Weeks   Status New     PT LONG TERM GOAL #4   Title Patient will report 50% or greater improvement in her symptoms of dizziness and imbalance with provoking motions or positions by 04/29/2017.   Time 8   Period Weeks   Status New     PT LONG TERM GOAL #5   Title Patient will have demonstrate decreased falls risk as indicated by Activities Specific Balance Confidence Scale score of 80% or greater.   Baseline scored 62.5% on 03/04/2017     Additional Long Term Goals   Additional Long Term Goals Yes     PT LONG TERM GOAL #6   Title Patient will be able to shave without symptoms of dizziness.    Time 8   Period Weeks   Status New               Plan - 2017/04/01 1010    Clinical Impression Statement Patient with  positive left Dix-Hallpike test this date which could be consistent with left posterior canal BPPV. Patient treated with canalith repositioning maneuver this date. Patient also found to have abnormal saccades with vestibular testing which could be indicative of a central symptoms. Patient would benefit from continued PT services to address functional deficits, perform repeat CRT if indicated, to address goals as set on POC and to try to reduce patient's falls risk.    Rehab Potential Fair   Clinical Impairments Affecting Rehab Potential Positive Indicators: motivated, has improved with vestibular therapy in the past  Negative Indicators: chronicity, comorbidities   PT Frequency --  2 times a week for 2 weeks and then 1 time a week for 6 weeks   PT Duration 8 weeks   PT Treatment/Interventions Canalith Repostioning;Neuromuscular re-education;Balance training;Therapeutic exercise;Therapeutic activities;Functional mobility training;Stair training;Gait training;Patient/family education;Vestibular   PT Next Visit Plan Repeat L Dix-Hallpike test and CRT if indicated test right Dix-Hallpike test   Consulted and Agree with Plan of Care Patient      Patient will benefit from skilled therapeutic intervention in order to improve the following deficits and impairments:  Decreased balance, Dizziness, Difficulty walking, Decreased strength  Visit Diagnosis: Dizziness and giddiness  Difficulty in walking, not elsewhere classified  Repeated falls      G-Codes - 04/01/2017 1355    Functional Assessment Tool Used (Outpatient Only) clinical judgment, DHI, ABC scale, DGI, modified CTSIB   Functional Limitation Mobility: Walking and moving around   Mobility: Walking and Moving Around Current Status ([id number]) At least 20 percent but less than 40 percent impaired, limited or restricted   Mobility: Walking and Moving Around Goal Status [phone]) At least 1 percent but less than 20 percent impaired, limited or  restricted       Problem List Patient Active Problem List   Diagnosis Date Noted  . Malignant neoplasm of right lung ([place]) 04/18/2016  . CAD (coronary artery disease) 04/01/2016  . Adenocarcinoma ([place]) 04/01/2016  . Skin cyst 12/26/2015  . GERD (gastroesophageal reflux disease) 06/16/2015  . Abscess of back 06/06/2015  . Vertigo 03/27/2015  . Carpal tunnel syndrome 10/28/2014  . Status post cholecystectomy 09/29/2014  .  Disease of digestive tract 09/29/2014  . Nonischemic cardiomyopathy ([place]) 09/26/2014  . Cardiomyopathy ([place]) 09/26/2014  . Diabetes mellitus without complication ([place])   . Sleep apnea   . Spinal stenosis, lumbar region, with neurogenic claudication 06/07/2014  . Lumbar stenosis with neurogenic claudication 06/07/2014  . Abnormal gait 08/20/2012  . H/O total knee replacement 08/20/2012  . Arthritis of knee, degenerative 08/20/2012  . Pacemaker-St.Jude 08/03/2012  . Cardiac conduction disorder 06/19/2012  . Acid reflux 06/18/2012  . 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 Triggering of digit 03/25/2012  . Hyperlipidemia 12/23/2011  . Essential hypertension 03/23/2011  . Complete heart block ([place]) 03/23/2011  . Complete atrioventricular block ([place]) 03/23/2011   Lady Deutscher PT, DPT Lady Deutscher 03/05/2017, 1:56 PM  [place] MA[facility] SERVICES [address] [place], [place], [address] Phone: [phone]   Fax:  [phone]  Name: Howard [name] MRN: [id number] Date of Birth: Feb 23, 1931

## 2017-03-11 ENCOUNTER — Encounter: Payer: Medicare Other | Admitting: Physical Therapy

## 2017-03-12 ENCOUNTER — Encounter: Payer: Self-pay | Admitting: Physical Therapy

## 2017-03-12 ENCOUNTER — Ambulatory Visit: Payer: Medicare Other | Admitting: Physical Therapy

## 2017-03-12 DIAGNOSIS — R296 Repeated falls: Secondary | ICD-10-CM | POA: Diagnosis not present

## 2017-03-12 DIAGNOSIS — R42 Dizziness and giddiness: Secondary | ICD-10-CM

## 2017-03-12 DIAGNOSIS — R262 Difficulty in walking, not elsewhere classified: Secondary | ICD-10-CM | POA: Diagnosis not present

## 2017-03-12 NOTE — Therapy (Signed)
[place] MAIN [facility] SERVICES [place], [place], [address] Phone: [phone]   Fax:  [phone]  Physical Therapy Treatment  Patient Details  Name: Howard [name] MRN: [id number] Date of Birth: [birth year] Referring Provider: Dr. Pati Gallo  Encounter Date: 03/12/2017      PT End of Session - 03/12/17 1337    Visit Number 2   Number of Visits 14   Date for PT Re-Evaluation 04/29/17   PT Start Time 0132   PT Stop Time 0205   PT Time Calculation (min) 33 min   Activity Tolerance Patient tolerated treatment well   Behavior During Therapy [name] for tasks assessed/performed      Past Medical History:  Diagnosis Date  . Arthritis   . Bell palsy   . Cancer [facility])    prostate and skin  . Chronic combined systolic and diastolic CHF, NYHA class 1 ([place])    a. 07/2014 Echo: EF 35-40%, Gr 1 DD.  Marland Kitchen Complete heart block ([place])    a. 11/2010 s/p SJM 2210 Accent DC PPM, ser# [id number].  . Depression   . Diabetes mellitus without complication ([employer])   . Fall 11-10-14  . GERD (gastroesophageal reflux disease)   . History of prostate cancer   . Hyperlipidemia   . Hypertension   . LBBB (left bundle branch block)   . Left-sided Bell's palsy   . Lung cancer ([place]) 2016  . NICM (nonischemic cardiomyopathy) ([place])    a. 07/2014 Echo: EF 35-40%, mid-apicalanteroseptal DK, Gr 1 DD, mild-mod dil LA.  . Non-obstructive CAD    a. 07/2014 Abnl MV;  b. 08/2014 Cath: LM nl, LAD 30p, RI 40p, LCX nl, OM1 40, RCA dominant 30p, 70d-->Med Rx.  Marland Kitchen Poor balance   . Presence of permanent cardiac pacemaker   . Sleep apnea    a. cpap  . Vertigo   . WPW (Wolff-Parkinson-White syndrome)    a. S/P RFCA 1991.    Past Surgical History:  Procedure Laterality Date  . APPLICATION OF WOUND VAC Left 06/07/2015   Procedure: APPLICATION OF WOUND VAC;  Surgeon: Robert Bellow, MD;  Location: [facility] ORS;  Service: General;  Laterality: Left;  left upper back  . BACK SURGERY      2011  . CARDIAC CATHETERIZATION  08/26/2014   Single vessel obstructive CAD  . CARPAL TUNNEL RELEASE  04-04-15   [facility]  . CATARACT EXTRACTION  07-31-11 and 09-18-11  . Catheter ablation  1991   for WPW  . cervical fusion    . CHOLECYSTECTOMY  09-07-14  . HAND SURGERY     right 1993; left 2005  . HERNIA REPAIR  1955  . INSERT / REPLACE / REMOVE PACEMAKER    . JOINT REPLACEMENT Left 2013   knee  . JOINT REPLACEMENT Right 2004   knee  . KNEE SURGERY     left knee 1991 and 1992; right knee 1995  . LEFT HEART CATHETERIZATION WITH CORONARY ANGIOGRAM N/A 08/26/2014   Procedure: LEFT HEART CATHETERIZATION WITH CORONARY ANGIOGRAM;  Surgeon: [name], MD;  Location: [facility] CATH LAB;  Service: Cardiovascular;  Laterality: N/A;  . LUMBAR LAMINECTOMY/DECOMPRESSION MICRODISCECTOMY N/A 06/07/2014   Procedure: LUMBAR FOUR TO FIVE LUMBAR LAMINECTOMY/DECOMPRESSION MICRODISCECTOMY 1 LEVEL;  Surgeon: [name], MD;  Location: [facility] NEURO ORS;  Service: Neurosurgery;  Laterality: N/A;  . LUNG BIOPSY Right 2016   Dr [name]  . PACEMAKER INSERTION     PPM-- St Jude 11/30/10 by  [place]     cancer--1998, prostatectomy  . REPLACEMENT TOTAL KNEE     2004  . ruptured disc     1962 and 1998  . TRIGGER FINGER RELEASE  01-24-15  . WOUND DEBRIDEMENT Left 06/07/2015   Procedure: DEBRIDEMENT WOUND;  Surgeon: Robert Bellow, MD;  Location: [facility] ORS;  Service: General;  Laterality: Left;  left upper back    There were no vitals filed for this visit.      Subjective Assessment - 03/12/17 1335    Subjective Patient states that the first day after the canalith repositioning maneuver was difficult in regards to balance, but states it got a little better each day. Patient states he has not had any episodes of vertigo since the inital evaluation just imbalance.    Pertinent History Patient states that "you got that stuff straightened out" in 2015 in reference to prior vestibular therapy for dizziness and  vertigo. Patient states he had a fall on March 3rd when he got up at 3 am to let the dog out. Patient states he does not remember whether he was dizzy or not prior to or after the fall. Patient reports this is why he is returning to vestibular therapy. Patient states he has not been having vertigo or dizziness, but rather balance issues primarily at present. Patient states he has not been using any AD for mobility.    Patient Stated Goals Patient would like to be able to install an overhead lightbulb, lie in the prone position to shoot a rifle, be able to shave his chin and be able to get on the floor and look up underneath a table   Currently in Pain? No/denies        Canalith Repositioning:  On mat table, performed left Dix-Hallpike testing and it was positive with left upbeating torsional nystagmus of short duration without latency. Performed left canalith repositioning maneuver (CRT). Repeated left CRT for a total of 3 maneuvers with retesting between maneuvers. Patient reported 10/10 vertigo with first, 4/10 vertigo with second maneuver and 0/10 with last maneuver.  Performed right Dix-Hallpike test and it was negative with no nystagmus observed and patient denied  vertigo.         PT Education - 03/12/17 1337    Education provided Yes   Education Details discussed plan of care   Person(s) Educated Patient   Methods Explanation   Comprehension Verbalized understanding             PT Long Term Goals - 03/05/17 1014      PT LONG TERM GOAL #1   Title Patient will demonstrate reduced falls risk as evidenced by Dynamic Gait Index (DGI) >19/24 by 04/29/2017.   Baseline scored 16/24 on 03/04/2017   Time 8   Period Weeks   Status New     PT LONG TERM GOAL #2   Title Patient will reduce perceived disability to low levels as indicated by <40 on Dizziness Handicap Inventory.   Baseline scored 46/100 on 03/04/2017   Time 8   Period Weeks   Status New     PT LONG TERM GOAL #3    Title Patient reports no vertigo with provoking motions or positions.   Time 8   Period Weeks   Status New     PT LONG TERM GOAL #4   Title Patient will report 50% or greater improvement in her symptoms of dizziness and imbalance with provoking motions or positions by 04/29/2017.  Time 8   Period Weeks   Status New     PT LONG TERM GOAL #5   Title Patient will have demonstrate decreased falls risk as indicated by Activities Specific Balance Confidence Scale score of 80% or greater.   Baseline scored 62.5% on 03/04/2017     Additional Long Term Goals   Additional Long Term Goals Yes     PT LONG TERM GOAL #6   Title Patient will be able to shave without symptoms of dizziness.    Time 8   Period Weeks   Status New               Plan - 03/12/17 1338    Clinical Impression Statement Patient states he has not had any further episodes of vertigo since the inital evaluation but has had imbalance. Patient with negative right Dix-Hallpike test and positive left Dix-Hallpike test. Performed a total of 3 canalith repositioning maneuvers for the left ear with patient reporting decreased vertigo ratings with each subsequent CRT. Will repeat Dix-Hallpike tests and CRTs if indicated next session. Encouraged patient to continue to follow-up as scheduled.     Rehab Potential Fair   Clinical Impairments Affecting Rehab Potential Positive Indicators: motivated, has improved with vestibular therapy in the past  Negative Indicators: chronicity, comorbidities   PT Frequency --  2 times a week for 2 weeks and then 1 time a week for 6 weeks   PT Duration 8 weeks   PT Treatment/Interventions Canalith Repostioning;Neuromuscular re-education;Balance training;Therapeutic exercise;Therapeutic activities;Functional mobility training;Stair training;Gait training;Patient/family education;Vestibular   PT Next Visit Plan Repeat R & L Dix-Hallpike testing and CRT if indicated until clearance obtained; then work  on balance activities and then consider testing head thrust or head shaking nystagmus test   Consulted and Agree with Plan of Care Patient      Patient will benefit from skilled therapeutic intervention in order to improve the following deficits and impairments:  Decreased balance, Dizziness, Difficulty walking, Decreased strength  Visit Diagnosis: Dizziness and giddiness  Difficulty in walking, not elsewhere classified  Repeated falls     Problem List Patient Active Problem List   Diagnosis Date Noted  . Malignant neoplasm of right lung ([place]) 04/18/2016  . CAD (coronary artery disease) 04/01/2016  . Adenocarcinoma ([place]) 04/01/2016  . Skin cyst 12/26/2015  . GERD (gastroesophageal reflux disease) 06/16/2015  . Abscess of back 06/06/2015  . Vertigo 03/27/2015  . Carpal tunnel syndrome 10/28/2014  . Status post cholecystectomy 09/29/2014  . Disease of digestive tract 09/29/2014  . Nonischemic cardiomyopathy ([place]) 09/26/2014  . Cardiomyopathy ([place]) 09/26/2014  . Diabetes mellitus without complication ([place])   . Sleep apnea   . Spinal stenosis, lumbar region, with neurogenic claudication 06/07/2014  . Lumbar stenosis with neurogenic claudication 06/07/2014  . Abnormal gait 08/20/2012  . H/O total knee replacement 08/20/2012  . Arthritis of knee, degenerative 08/20/2012  . Pacemaker-St.Jude 08/03/2012  . Cardiac conduction disorder 06/19/2012  . Acid reflux 06/18/2012  . 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 Triggering of digit 03/25/2012  . Hyperlipidemia 12/23/2011  . Essential hypertension 03/23/2011  . Complete heart block ([place]) 03/23/2011  . Complete atrioventricular block ([place]) 03/23/2011   Lady Deutscher PT, DPT Lady Deutscher 03/12/2017, 2:30 PM  [place] MAIN [facility] SERVICES [address] [place], [place], [address] Phone: [phone]   Fax:  [phone]  Name: Howard [name] MRN: [id number] Date of Birth:  21-Nov-1931

## 2017-03-14 ENCOUNTER — Ambulatory Visit: Payer: Medicare Other | Admitting: Physical Therapy

## 2017-03-14 ENCOUNTER — Encounter: Payer: Self-pay | Admitting: Physical Therapy

## 2017-03-14 DIAGNOSIS — R262 Difficulty in walking, not elsewhere classified: Secondary | ICD-10-CM

## 2017-03-14 DIAGNOSIS — R296 Repeated falls: Secondary | ICD-10-CM | POA: Diagnosis not present

## 2017-03-14 DIAGNOSIS — R42 Dizziness and giddiness: Secondary | ICD-10-CM

## 2017-03-14 NOTE — Therapy (Signed)
[place] MAIN [facility] SERVICES [place], [place], [address] Phone: [phone]   Fax:  [phone]  Physical Therapy Treatment  Patient Details  Name: Howard [name] MRN: [id number] Date of Birth: 1931/08/31 Referring Provider: Dr. Pati [name]  Encounter Date: 03/14/2017      PT End of Session - 03/14/17 1344    Visit Number 3   Number of Visits 14   Date for PT Re-Evaluation 04/29/17   PT Start Time 0135   PT Stop Time 0210   PT Time Calculation (min) 35 min   Activity Tolerance Patient tolerated treatment well   Behavior During Therapy [facility] for tasks assessed/performed      Past Medical History:  Diagnosis Date  . Arthritis   . Bell palsy   . Cancer Howard [name])    prostate and skin  . Chronic combined systolic and diastolic CHF, NYHA class 1 ([place])    a. 07/2014 Echo: EF 35-40%, Gr 1 DD.  Marland Kitchen Complete heart block ([place])    a. 11/2010 s/p SJM 2210 Accent DC PPM, ser# [id number].  . Depression   . Diabetes mellitus without complication ([place])   . Fall 11-10-14  . GERD (gastroesophageal reflux disease)   . History of prostate cancer   . Hyperlipidemia   . Hypertension   . LBBB (left bundle branch block)   . Left-sided Bell's palsy   . Lung cancer ([place]) 2016  . NICM (nonischemic cardiomyopathy) ([place])    a. 07/2014 Echo: EF 35-40%, mid-apicalanteroseptal DK, Gr 1 DD, mild-mod dil LA.  . Non-obstructive CAD    a. 07/2014 Abnl MV;  b. 08/2014 Cath: LM nl, LAD 30p, RI 40p, LCX nl, OM1 40, RCA dominant 30p, 70d-->Med Rx.  Marland Kitchen Poor balance   . Presence of permanent cardiac pacemaker   . Sleep apnea    a. cpap  . Vertigo   . WPW (Wolff-Parkinson-White syndrome)    a. S/P RFCA 1991.    Past Surgical History:  Procedure Laterality Date  . APPLICATION OF WOUND VAC Left 06/07/2015   Procedure: APPLICATION OF WOUND VAC;  Surgeon: Howard Bellow, MD;  Location: [facility] ORS;  Service: General;  Laterality: Left;  left upper back  . BACK SURGERY      2011  . CARDIAC CATHETERIZATION  08/26/2014   Single vessel obstructive CAD  . CARPAL TUNNEL RELEASE  04-04-15   [facility]  . CATARACT EXTRACTION  07-31-11 and 09-18-11  . Catheter ablation  1991   for WPW  . cervical fusion    . CHOLECYSTECTOMY  09-07-14  . HAND SURGERY     right 1993; left 2005  . HERNIA REPAIR  1955  . INSERT / REPLACE / REMOVE PACEMAKER    . JOINT REPLACEMENT Left 2013   knee  . JOINT REPLACEMENT Right 2004   knee  . KNEE SURGERY     left knee 1991 and 1992; right knee 1995  . LEFT HEART CATHETERIZATION WITH CORONARY ANGIOGRAM N/A 08/26/2014   Procedure: LEFT HEART CATHETERIZATION WITH CORONARY ANGIOGRAM;  Surgeon: Howard M Martinique, MD;  Location: [facility] CATH LAB;  Service: Cardiovascular;  Laterality: N/A;  . LUMBAR LAMINECTOMY/DECOMPRESSION MICRODISCECTOMY N/A 06/07/2014   Procedure: LUMBAR FOUR TO FIVE LUMBAR LAMINECTOMY/DECOMPRESSION MICRODISCECTOMY 1 LEVEL;  Surgeon: Howard Pitter, MD;  Location: [place] NEURO ORS;  Service: Neurosurgery;  Laterality: N/A;  . LUNG BIOPSY Right 2016   Dr Howard [name]  . PACEMAKER INSERTION     PPM-- St Jude 11/30/10 by  [place]     cancer--1998, prostatectomy  . REPLACEMENT TOTAL KNEE     2004  . ruptured disc     1962 and 1998  . TRIGGER FINGER RELEASE  01-24-15  . WOUND DEBRIDEMENT Left 06/07/2015   Procedure: DEBRIDEMENT WOUND;  Surgeon: Howard Bellow, MD;  Location: [facility] ORS;  Service: General;  Laterality: Left;  left upper back    There were no vitals filed for this visit.      Subjective Assessment - 03/14/17 1341    Subjective Patient states that Thursday and Friday he has been feeling really unsteady. Patient states that when he arrived and was trying to get out of the car, he had an episode and was unsteady so his wife brought patient down in a wheelchair. Patient reports that he has not been having vertigo but has been feeling "unstable".     Pertinent History Patient states that "you got that stuff straightened out"  in 2015 in reference to prior vestibular therapy for dizziness and vertigo. Patient states he had a fall on March 3rd when he got up at 3 am to let the dog out. Patient states he does not remember whether he was dizzy or not prior to or after the fall. Patient reports this is why he is returning to vestibular therapy. Patient states he has not been having vertigo or dizziness, but rather balance issues primarily at present. Patient states he has not been using any AD for mobility.    Limitations Sitting   Patient Stated Goals Patient would like to be able to install an overhead lightbulb, lie in the prone position to shoot a rifle, be able to shave his chin and be able to get on the floor and look up underneath a table   Currently in Pain? No/denies     Canalith Repositioning:  On mat table, performed left Dix-Hallpike testing and it was positive with left upbeating torsional nystagmus 2 beats without latency. Performed left canalith repositioning maneuver (CRT). Repeated left CRT for a total of 3 maneuvers with retesting between maneuvers. Patient reported 3-4/10 vertigo with testing.  NOTE: After canalith repositioning, performed neuromuscular re-education.   Neuromuscular Re-education:  Semi-tandem stance:  On firm surface, patient performed semi-tandem progressions with alternating lead leg static holds about 30 seconds each. Patient touching support surface intermittently for balance with CGA. Patient able to progress to heel to base of great toe off set by about 1". Issued this exercise for HEP and handout provided.         PT Education - 03/14/17 1343    Education provided Yes   Education Details reviewed plan of care; issued semi-tandem stance for HEP   Person(s) Educated Patient   Methods Explanation;Handout;Verbal cues;Demonstration   Comprehension Verbalized understanding;Returned demonstration             PT Long Term Goals - 03/05/17 1014      PT LONG TERM GOAL #1    Title Patient will demonstrate reduced falls risk as evidenced by Dynamic Gait Index (DGI) >19/24 by 04/29/2017.   Baseline scored 16/24 on 03/04/2017   Time 8   Period Weeks   Status New     PT LONG TERM GOAL #2   Title Patient will reduce perceived disability to low levels as indicated by <40 on Dizziness Handicap Inventory.   Baseline scored 46/100 on 03/04/2017   Time 8   Period Weeks   Status New     PT  LONG TERM GOAL #3   Title Patient reports no vertigo with provoking motions or positions.   Time 8   Period Weeks   Status New     PT LONG TERM GOAL #4   Title Patient will report 50% or greater improvement in her symptoms of dizziness and imbalance with provoking motions or positions by 04/29/2017.   Time 8   Period Weeks   Status New     PT LONG TERM GOAL #5   Title Patient will have demonstrate decreased falls risk as indicated by Activities Specific Balance Confidence Scale score of 80% or greater.   Baseline scored 62.5% on 03/04/2017     Additional Long Term Goals   Additional Long Term Goals Yes     PT LONG TERM GOAL #6   Title Patient will be able to shave without symptoms of dizziness.    Time 8   Period Weeks   Status New               Plan - 03/14/17 1344    Clinical Impression Statement Patient reporting increase inn imbalance sensation but reporting that he has not had any vertiginous symptoms. Patient with positive left Dix-Hallpike test this date and therefore performed 3 canalith repositioning maneuvers. Patient started on exercise for balance for home exercise program. Recommended to patient and his wife to utilize an assistive device until his balance improves. Patient has both a SPC and RW at home and has utilized both assistive devices in the past.    Rehab Potential Fair   Clinical Impairments Affecting Rehab Potential Positive Indicators: motivated, has improved with vestibular therapy in the past  Negative Indicators: chronicity, comorbidities    PT Frequency --  2 times a week for 2 weeks and then 1 time a week for 6 weeks   PT Duration 8 weeks   PT Treatment/Interventions Canalith Repostioning;Neuromuscular re-education;Balance training;Therapeutic exercise;Therapeutic activities;Functional mobility training;Stair training;Gait training;Patient/family education;Vestibular   PT Next Visit Plan Repeat R & L Dix-Hallpike testing and CRT if indicated until clearance obtained; then work on balance activities and then consider testing head thrust or head shaking nystagmus test   PT Home Exercise Plan semi-tandem stance on firm 30-60 second holds without head turns at present until negative Dix-Hallpike testing   Consulted and Agree with Plan of Care Patient      Patient will benefit from skilled therapeutic intervention in order to improve the following deficits and impairments:  Decreased balance, Dizziness, Difficulty walking, Decreased strength  Visit Diagnosis: Dizziness and giddiness  Difficulty in walking, not elsewhere classified  Repeated falls     Problem List Patient Active Problem List   Diagnosis Date Noted  . Malignant neoplasm of right lung ([place]) 04/18/2016  . CAD (coronary artery disease) 04/01/2016  . Adenocarcinoma ([place]) 04/01/2016  . Skin cyst 12/26/2015  . GERD (gastroesophageal reflux disease) 06/16/2015  . Abscess of back 06/06/2015  . Vertigo 03/27/2015  . Carpal tunnel syndrome 10/28/2014  . Status post cholecystectomy 09/29/2014  . Disease of digestive tract 09/29/2014  . Nonischemic cardiomyopathy ([place]) 09/26/2014  . Cardiomyopathy ([place]) 09/26/2014  . Diabetes mellitus without complication ([place])   . Sleep apnea   . Spinal stenosis, lumbar region, with neurogenic claudication 06/07/2014  . Lumbar stenosis with neurogenic claudication 06/07/2014  . Abnormal gait 08/20/2012  . H/O total knee replacement 08/20/2012  . Arthritis of knee, degenerative 08/20/2012  . Pacemaker-St.Jude 08/03/2012  .  Cardiac conduction disorder 06/19/2012  . Acid reflux 06/18/2012  .  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 Triggering of digit 03/25/2012  . Hyperlipidemia 12/23/2011  . Essential hypertension 03/23/2011  . Complete heart block ([place]) 03/23/2011  . Complete atrioventricular block ([place]) 03/23/2011   Lady Deutscher PT, DPT Lady Deutscher 03/14/2017, 3:49 PM  [place] MAIN [facility] SERVICES [address] [place], [place], [address] Phone: [phone]   Fax:  [phone]  Name: Howard [name] MRN: [id number] Date of Birth: November 24, 1931

## 2017-03-18 ENCOUNTER — Ambulatory Visit: Payer: Medicare Other

## 2017-03-18 VITALS — BP 149/68 | HR 62

## 2017-03-18 DIAGNOSIS — R296 Repeated falls: Secondary | ICD-10-CM | POA: Diagnosis not present

## 2017-03-18 DIAGNOSIS — R42 Dizziness and giddiness: Secondary | ICD-10-CM

## 2017-03-18 DIAGNOSIS — R262 Difficulty in walking, not elsewhere classified: Secondary | ICD-10-CM | POA: Diagnosis not present

## 2017-03-18 NOTE — Therapy (Signed)
[place] MAIN [facility] SERVICES [place], [place], [address] Phone: [phone]   Fax:  [phone]  Physical Therapy Treatment  Patient Details  Name: Howard [name] MRN: [id number] Date of Birth: [birth year] Referring Provider: Dr. Pati Gallo  Encounter Date: 03/18/2017      PT End of Session - 03/18/17 0922    Visit Number 4   Number of Visits 14   Date for PT Re-Evaluation 04/29/17   PT Start Time 0930   PT Stop Time 1015   PT Time Calculation (min) 45 min   Activity Tolerance Patient tolerated treatment well   Behavior During Therapy [facility] for tasks assessed/performed      Past Medical History:  Diagnosis Date  . Arthritis   . Bell palsy   . Cancer [facility])    prostate and skin  . Chronic combined systolic and diastolic CHF, NYHA class 1 ([place])    a. 07/2014 Echo: EF 35-40%, Gr 1 DD.  Marland Kitchen Complete heart block ([place])    a. 11/2010 s/p SJM 2210 Accent DC PPM, ser# [id number].  . Depression   . Diabetes mellitus without complication ([place])   . Fall 11-10-14  . GERD (gastroesophageal reflux disease)   . History of prostate cancer   . Hyperlipidemia   . Hypertension   . LBBB (left bundle branch block)   . Left-sided Bell's palsy   . Lung cancer ([place]) 2016  . NICM (nonischemic cardiomyopathy) ([place])    a. 07/2014 Echo: EF 35-40%, mid-apicalanteroseptal DK, Gr 1 DD, mild-mod dil LA.  . Non-obstructive CAD    a. 07/2014 Abnl MV;  b. 08/2014 Cath: LM nl, LAD 30p, RI 40p, LCX nl, OM1 40, RCA dominant 30p, 70d-->Med Rx.  Marland Kitchen Poor balance   . Presence of permanent cardiac pacemaker   . Sleep apnea    a. cpap  . Vertigo   . WPW (Wolff-Parkinson-White syndrome)    a. S/P RFCA 1991.    Past Surgical History:  Procedure Laterality Date  . APPLICATION OF WOUND VAC Left 06/07/2015   Procedure: APPLICATION OF WOUND VAC;  Surgeon: Robert Bellow, MD;  Location: [facility] ORS;  Service: General;  Laterality: Left;  left upper back  . BACK SURGERY      2011  . CARDIAC CATHETERIZATION  08/26/2014   Single vessel obstructive CAD  . CARPAL TUNNEL RELEASE  04-04-15   [facility]  . CATARACT EXTRACTION  07-31-11 and 09-18-11  . Catheter ablation  1991   for WPW  . cervical fusion    . CHOLECYSTECTOMY  09-07-14  . HAND SURGERY     right 1993; left 2005  . HERNIA REPAIR  1955  . INSERT / REPLACE / REMOVE PACEMAKER    . JOINT REPLACEMENT Left 2013   knee  . JOINT REPLACEMENT Right 2004   knee  . KNEE SURGERY     left knee 1991 and 1992; right knee 1995  . LEFT HEART CATHETERIZATION WITH CORONARY ANGIOGRAM N/A 08/26/2014   Procedure: LEFT HEART CATHETERIZATION WITH CORONARY ANGIOGRAM;  Surgeon: [name], MD;  Location: [facility] CATH LAB;  Service: Cardiovascular;  Laterality: N/A;  . LUMBAR LAMINECTOMY/DECOMPRESSION MICRODISCECTOMY N/A 06/07/2014   Procedure: LUMBAR FOUR TO FIVE LUMBAR LAMINECTOMY/DECOMPRESSION MICRODISCECTOMY 1 LEVEL;  Surgeon: [name], MD;  Location: [place] NEURO ORS;  Service: Neurosurgery;  Laterality: N/A;  . LUNG BIOPSY Right 2016   Dr [name]  . PACEMAKER INSERTION     PPM-- St Jude 11/30/10 by  [place]     cancer--1998, prostatectomy  . REPLACEMENT TOTAL KNEE     2004  . ruptured disc     1962 and 1998  . TRIGGER FINGER RELEASE  01-24-15  . WOUND DEBRIDEMENT Left 06/07/2015   Procedure: DEBRIDEMENT WOUND;  Surgeon: Robert Bellow, MD;  Location: [facility] ORS;  Service: General;  Laterality: Left;  left upper back    Vitals:   03/18/17 0932  BP: (!) 149/68  Pulse: 62  SpO2: 97%        Subjective Assessment - 03/18/17 0921    Subjective Pt reports that he was particularly unsteady the two days following his last treatment. He has been using a cane to help him ambulate and arrives with his cane on this date. Denies pain. He performed semi-tandem balance for HEP since last treatment session.    Pertinent History Patient states that "you got that stuff straightened out" in 2015 in reference to prior  vestibular therapy for dizziness and vertigo. Patient states he had a fall on March 3rd when he got up at 3 am to let the dog out. Patient states he does not remember whether he was dizzy or not prior to or after the fall. Patient reports this is why he is returning to vestibular therapy. Patient states he has not been having vertigo or dizziness, but rather balance issues primarily at present. Patient states he has not been using any AD for mobility.    Limitations Sitting   Patient Stated Goals Patient would like to be able to install an overhead lightbulb, lie in the prone position to shoot a rifle, be able to shave his chin and be able to get on the floor and look up underneath a table   Currently in Pain? No/denies            [facility] PT Assessment - 03/18/17 1005      Standardized Balance Assessment   Standardized Balance Assessment Five Times Sit to Stand   Five times sit to stand comments  16.5 seconds            TREATMENT  Canalith Repositioning: On inverted mat table, performed right and left Dix-Hallpike testing. Negative R Dix-Hallpike test. L Dix-Hallpike test positive with left upbeating torsional nystagmus 20 beats without latency. Pt reported 4/10 vertigo. Performed left canalith repositioning maneuver (CRT). Repeated left CRT for a total of 2 maneuvers with retesting between maneuvers. After first maneuver pt with negative L Dix-Hallpike test with retesting. Pt taken through second maneuver to ensure complete resolution. Negative L Dix-Hallpike test after second CRT. Pt kept in each position for a full 2 minutes during CRT to encourage full clearance of debris.  NOTE: After canalith repositioning, performed neuromuscular re-education.   Neuromuscular Re-education: Semi-tandem stance:  On firm surface, patient performed semi-tandem progressions with alternating lead leg static holds about 30 seconds each x 3; Patient touching support surface intermittently for balance  with CGA. Patient able to progress to heel to base of great toe off set by about 1".  5TSTS test performed with patient in 16.5 seconds, issued sit to stand exercise to add to HEP;                    PT Education - 03/18/17 0922    Education provided Yes   Education Details Plan of care, reinforced semi-tandem stance balance, added sit to stand to HEP   Person(s) Educated Patient   Methods Explanation;Handout;Verbal cues  Comprehension Verbalized understanding;Returned demonstration             PT Long Term Goals - 03/05/17 1014      PT LONG TERM GOAL #1   Title Patient will demonstrate reduced falls risk as evidenced by Dynamic Gait Index (DGI) >19/24 by 04/29/2017.   Baseline scored 16/24 on 03/04/2017   Time 8   Period Weeks   Status New     PT LONG TERM GOAL #2   Title Patient will reduce perceived disability to low levels as indicated by <40 on Dizziness Handicap Inventory.   Baseline scored 46/100 on 03/04/2017   Time 8   Period Weeks   Status New     PT LONG TERM GOAL #3   Title Patient reports no vertigo with provoking motions or positions.   Time 8   Period Weeks   Status New     PT LONG TERM GOAL #4   Title Patient will report 50% or greater improvement in her symptoms of dizziness and imbalance with provoking motions or positions by 04/29/2017.   Time 8   Period Weeks   Status New     PT LONG TERM GOAL #5   Title Patient will have demonstrate decreased falls risk as indicated by Activities Specific Balance Confidence Scale score of 80% or greater.   Baseline scored 62.5% on 03/04/2017     Additional Long Term Goals   Additional Long Term Goals Yes     PT LONG TERM GOAL #6   Title Patient will be able to shave without symptoms of dizziness.    Time 8   Period Weeks   Status New               Plan - 03/18/17 6387    Clinical Impression Statement Continued negative R Dix-Hallpike test on this date. However pt continues to present  with positive L Dix-Hallpike test therefore once again treated patient with 2 canalith repositioning maneuvers this session. Dix-Hallpike test before and after second CRT are both negative. Reinforced semi-tandem balance exercise for HEP and added sit to stand without UE support. Five Time Sit to Stand test today is 16.5 seconds which is above age/gender norms. Pt encouraged to follow-up as scheduled. Cancelled patient's appointment this Friday due to skin surgery on L ear/scalp surgery this Thursday. Will continue to treat pt with CRT and once clear with continue to further assessment and intervention for identified deficits.    Rehab Potential Fair   Clinical Impairments Affecting Rehab Potential Positive Indicators: motivated, has improved with vestibular therapy in the past  Negative Indicators: chronicity, comorbidities   PT Frequency --  2 times a week for 2 weeks and then 1 time a week for 6 weeks   PT Duration 8 weeks   PT Treatment/Interventions Canalith Repostioning;Neuromuscular re-education;Balance training;Therapeutic exercise;Therapeutic activities;Functional mobility training;Stair training;Gait training;Patient/family education;Vestibular   PT Next Visit Plan R side negative, Repeat L Dix-Hallpike testing and CRT if indicated until clearance obtained; then work on balance activities and then consider testing head thrust or head shaking nystagmus test   PT Home Exercise Plan semi-tandem stance on firm 30-60 second holds without head turns at present until negative Dix-Hallpike testing, sit to stand 2 x 10 BID;   Consulted and Agree with Plan of Care Patient      Patient will benefit from skilled therapeutic intervention in order to improve the following deficits and impairments:  Decreased balance, Dizziness, Difficulty walking, Decreased strength  Visit Diagnosis: Dizziness  and giddiness  Difficulty in walking, not elsewhere classified     Problem List Patient Active Problem  List   Diagnosis Date Noted  . Malignant neoplasm of right lung ([place]) 04/18/2016  . CAD (coronary artery disease) 04/01/2016  . Adenocarcinoma ([place]) 04/01/2016  . Skin cyst 12/26/2015  . GERD (gastroesophageal reflux disease) 06/16/2015  . Abscess of back 06/06/2015  . Vertigo 03/27/2015  . Carpal tunnel syndrome 10/28/2014  . Status post cholecystectomy 09/29/2014  . Disease of digestive tract 09/29/2014  . Nonischemic cardiomyopathy ([place]) 09/26/2014  . Cardiomyopathy ([place]) 09/26/2014  . Diabetes mellitus without complication ([place])   . Sleep apnea   . Spinal stenosis, lumbar region, with neurogenic claudication 06/07/2014  . Lumbar stenosis with neurogenic claudication 06/07/2014  . Abnormal gait 08/20/2012  . H/O total knee replacement 08/20/2012  . Arthritis of knee, degenerative 08/20/2012  . Pacemaker-St.Jude 08/03/2012  . Cardiac conduction disorder 06/19/2012  . Acid reflux 06/18/2012  . 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 Triggering of digit 03/25/2012  . Hyperlipidemia 12/23/2011  . Essential hypertension 03/23/2011  . Complete heart block ([place]) 03/23/2011  . Complete atrioventricular block ([place]) 03/23/2011   Phillips Grout PT, DPT   Annaliese Saez 03/18/2017, 10:17 AM  [place] MAIN [facility] SERVICES [place], [place], [address] Phone: [phone]   Fax:  [phone]  Name: Howard [name] MRN: [id number] Date of Birth: 1931-06-16

## 2017-03-20 DIAGNOSIS — Z85828 Personal history of other malignant neoplasm of skin: Secondary | ICD-10-CM | POA: Diagnosis not present

## 2017-03-20 DIAGNOSIS — C44229 Squamous cell carcinoma of skin of left ear and external auricular canal: Secondary | ICD-10-CM | POA: Diagnosis not present

## 2017-03-21 ENCOUNTER — Encounter: Payer: Medicare Other | Admitting: Physical Therapy

## 2017-03-25 ENCOUNTER — Ambulatory Visit: Payer: Medicare Other | Attending: Family Medicine

## 2017-03-25 VITALS — BP 156/76 | HR 73

## 2017-03-25 DIAGNOSIS — R262 Difficulty in walking, not elsewhere classified: Secondary | ICD-10-CM | POA: Diagnosis not present

## 2017-03-25 DIAGNOSIS — R296 Repeated falls: Secondary | ICD-10-CM | POA: Diagnosis not present

## 2017-03-25 DIAGNOSIS — R42 Dizziness and giddiness: Secondary | ICD-10-CM | POA: Diagnosis not present

## 2017-03-25 NOTE — Therapy (Signed)
[place] MAIN [facility] SERVICES [address] [place], [place], [address] Phone: [phone]   Fax:  [phone]  Physical Therapy Treatment  Patient Details  Name: Howard [name] MRN: [id number] Date of Birth: 1931-05-12 Referring Provider: Dr. Pati Gallo  Encounter Date: 03/25/2017      PT End of Session - 03/25/17 1433    Visit Number 5   Number of Visits 14   Date for PT Re-Evaluation 04/29/17   PT Start Time 9242   PT Stop Time 1515   PT Time Calculation (min) 40 min   Activity Tolerance Patient tolerated treatment well   Behavior During Therapy [facility] for tasks assessed/performed      Past Medical History:  Diagnosis Date  . Arthritis   . Bell palsy   . Cancer [facility])    prostate and skin  . Chronic combined systolic and diastolic CHF, NYHA class 1 ([place])    a. 07/2014 Echo: EF 35-40%, Gr 1 DD.  Marland Kitchen Complete heart block ([place])    a. 11/2010 s/p SJM 2210 Accent DC PPM, ser# [id number].  . Depression   . Diabetes mellitus without complication ([place])   . Fall 11-10-14  . GERD (gastroesophageal reflux disease)   . History of prostate cancer   . Hyperlipidemia   . Hypertension   . LBBB (left bundle branch block)   . Left-sided Bell's palsy   . Lung cancer ([place]) 2016  . NICM (nonischemic cardiomyopathy) ([place])    a. 07/2014 Echo: EF 35-40%, mid-apicalanteroseptal DK, Gr 1 DD, mild-mod dil LA.  . Non-obstructive CAD    a. 07/2014 Abnl MV;  b. 08/2014 Cath: LM nl, LAD 30p, RI 40p, LCX nl, OM1 40, RCA dominant 30p, 70d-->Med Rx.  Marland Kitchen Poor balance   . Presence of permanent cardiac pacemaker   . Sleep apnea    a. cpap  . Vertigo   . WPW (Wolff-Parkinson-White syndrome)    a. S/P RFCA 1991.    Past Surgical History:  Procedure Laterality Date  . APPLICATION OF WOUND VAC Left 06/07/2015   Procedure: APPLICATION OF WOUND VAC;  Surgeon: Robert Bellow, MD;  Location: [facility] ORS;  Service: General;  Laterality: Left;  left upper back  . BACK SURGERY      2011  . CARDIAC CATHETERIZATION  08/26/2014   Single vessel obstructive CAD  . CARPAL TUNNEL RELEASE  04-04-15   [facility]  . CATARACT EXTRACTION  07-31-11 and 09-18-11  . Catheter ablation  1991   for WPW  . cervical fusion    . CHOLECYSTECTOMY  09-07-14  . HAND SURGERY     right 1993; left 2005  . HERNIA REPAIR  1955  . INSERT / REPLACE / REMOVE PACEMAKER    . JOINT REPLACEMENT Left 2013   knee  . JOINT REPLACEMENT Right 2004   knee  . KNEE SURGERY     left knee 1991 and 1992; right knee 1995  . LEFT HEART CATHETERIZATION WITH CORONARY ANGIOGRAM N/A 08/26/2014   Procedure: LEFT HEART CATHETERIZATION WITH CORONARY ANGIOGRAM;  Surgeon: [name], MD;  Location: [facility] CATH LAB;  Service: Cardiovascular;  Laterality: N/A;  . LUMBAR LAMINECTOMY/DECOMPRESSION MICRODISCECTOMY N/A 06/07/2014   Procedure: LUMBAR FOUR TO FIVE LUMBAR LAMINECTOMY/DECOMPRESSION MICRODISCECTOMY 1 LEVEL;  Surgeon: [name], MD;  Location: [place] NEURO ORS;  Service: Neurosurgery;  Laterality: N/A;  . LUNG BIOPSY Right 2016   Dr [name]  . PACEMAKER INSERTION     PPM-- St Jude 11/30/10 by  [place]     cancer--1998, prostatectomy  . REPLACEMENT TOTAL KNEE     2004  . ruptured disc     1962 and 1998  . TRIGGER FINGER RELEASE  01-24-15  . WOUND DEBRIDEMENT Left 06/07/2015   Procedure: DEBRIDEMENT WOUND;  Surgeon: Robert Bellow, MD;  Location: [facility] ORS;  Service: General;  Laterality: Left;  left upper back    Vitals:   03/25/17 1437  BP: (!) 156/76  Pulse: 73  SpO2: 96%        Subjective Assessment - 03/25/17 1432    Subjective Pt reports that he felt unwell for a couple days after his last therapy session. He has not had any further episodes of vertigo. 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 He has stiches in his ear and arrives with ear covered. He reports some bleeding as recently as yesterday from the incision. He is returning on Monday to have more areas removed  from his scalp and stitches removed from L ear.   Pertinent History Patient states that "you got that stuff straightened out" in 2015 in reference to prior vestibular therapy for dizziness and vertigo. Patient states he had a fall on March 3rd when he got up at 3 am to let the dog out. Patient states he does not remember whether he was dizzy or not prior to or after the fall. Patient reports this is why he is returning to vestibular therapy. Patient states he has not been having vertigo or dizziness, but rather balance issues primarily at present. Patient states he has not been using any AD for mobility.    Limitations Sitting   Patient Stated Goals Patient would like to be able to install an overhead lightbulb, lie in the prone position to shoot a rifle, be able to shave his chin and be able to get on the floor and look up underneath a table   Currently in Pain? No/denies          TREATMENT  Canalith Repositioning: On inverted mat table, performed left Dix-Hallpike testing. Utilized pillow behind shoulder blades to encourage deeper head hang and minimize table contact with L ear. Dix-Hallpike test is positive with left upbeating torsional nystagmus 20 beats without latency. Pt reported 5/10 vertigo. Performed left canalith repositioning maneuver (CRT). Repeated left CRT for a total of 2 maneuvers with retesting between maneuvers. After first maneuver pt with no nystagmus with retesting but reports 1/10 vertigo. Pt taken through second maneuver followed by retesting which was negative.  Pt kept in each position for 1 minute during [place]. When returning to sitting pt reports some vertigo after third test. Therapy tech assisted with L sidelying test.due to history of cervical fusion and back pain, negative test but performed [place] maneuver for L side with 2 minute hold in both L and R position.  NOTE: After canalith repositioning, performed neuromuscular re-education.   Neuromuscular  Re-education: Semi-tandem stance:  On firm surface, patient performed semi-tandem progressions with alternating lead leg static holds about 30 seconds each x 3, Patient able to progress on this date to full tandem stance with 2 finger touch on // bars every 8-10 seconds. Pt encouraged to progress difficulty of this exercise at home by moving closer to full tandem stance; Sit to stand without UE support 2 x 10, second set performed with Airex pad under feet;  PT Education - 03/25/17 1432    Education provided Yes   Education Details Plan of care, HEP progressed   Person(s) Educated Patient   Methods Explanation   Comprehension Verbalized understanding             PT Long Term Goals - 03/05/17 1014      PT LONG TERM GOAL #1   Title Patient will demonstrate reduced falls risk as evidenced by Dynamic Gait Index (DGI) >19/24 by 04/29/2017.   Baseline scored 16/24 on 03/04/2017   Time 8   Period Weeks   Status New     PT LONG TERM GOAL #2   Title Patient will reduce perceived disability to low levels as indicated by <40 on Dizziness Handicap Inventory.   Baseline scored 46/100 on 03/04/2017   Time 8   Period Weeks   Status New     PT LONG TERM GOAL #3   Title Patient reports no vertigo with provoking motions or positions.   Time 8   Period Weeks   Status New     PT LONG TERM GOAL #4   Title Patient will report 50% or greater improvement in her symptoms of dizziness and imbalance with provoking motions or positions by 04/29/2017.   Time 8   Period Weeks   Status New     PT LONG TERM GOAL #5   Title Patient will have demonstrate decreased falls risk as indicated by Activities Specific Balance Confidence Scale score of 80% or greater.   Baseline scored 62.5% on 03/04/2017     Additional Long Term Goals   Additional Long Term Goals Yes     PT LONG TERM GOAL #6   Title Patient will be able to shave without symptoms of dizziness.     Time 8   Period Weeks   Status New               Plan - 03/25/17 1433    Clinical Impression Statement Pt continues to have persistent positive Dix-Hallpike testing on the L side. Symptoms resolves during sessions with CRT but then return at subsequent therapy sessions with persistent positive testing on the L side. Will continue to treat patient for L posterior canal BPPV until full clearance is obtained. Will also continue to progress balance and strengthening as approriate.    Rehab Potential Fair   Clinical Impairments Affecting Rehab Potential Positive Indicators: motivated, has improved with vestibular therapy in the past  Negative Indicators: chronicity, comorbidities   PT Frequency --  2 times a week for 2 weeks and then 1 time a week for 6 weeks   PT Duration 8 weeks   PT Treatment/Interventions Canalith Repostioning;Neuromuscular re-education;Balance training;Therapeutic exercise;Therapeutic activities;Functional mobility training;Stair training;Gait training;Patient/family education;Vestibular   PT Next Visit Plan Repeat L Dix-Hallpike testing and CRT if indicated until clearance obtained; then work on balance activities and then consider testing head thrust or head shaking nystagmus test as appropriate   PT Home Exercise Plan semi/full tandem stance 30s holds without head turns, sit to stand 2 x 10 BID;   Consulted and Agree with Plan of Care Patient      Patient will benefit from skilled therapeutic intervention in order to improve the following deficits and impairments:  Decreased balance, Dizziness, Difficulty walking, Decreased strength  Visit Diagnosis: Dizziness and giddiness  Difficulty in walking, not elsewhere classified     Problem List Patient Active Problem List   Diagnosis Date Noted  . Malignant neoplasm of right lung (  [place]) 04/18/2016  . CAD (coronary artery disease) 04/01/2016  . Adenocarcinoma ([place]) 04/01/2016  . Skin cyst 12/26/2015  . GERD  (gastroesophageal reflux disease) 06/16/2015  . Abscess of back 06/06/2015  . Vertigo 03/27/2015  . Carpal tunnel syndrome 10/28/2014  . Status post cholecystectomy 09/29/2014  . Disease of digestive tract 09/29/2014  . Nonischemic cardiomyopathy ([place]) 09/26/2014  . Cardiomyopathy ([place]) 09/26/2014  . Diabetes mellitus without complication ([place])   . Sleep apnea   . Spinal stenosis, lumbar region, with neurogenic claudication 06/07/2014  . Lumbar stenosis with neurogenic claudication 06/07/2014  . Abnormal gait 08/20/2012  . H/O total knee replacement 08/20/2012  . Arthritis of knee, degenerative 08/20/2012  . Pacemaker-St.Jude 08/03/2012  . Cardiac conduction disorder 06/19/2012  . Acid reflux 06/18/2012  . 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 Triggering of digit 03/25/2012  . Hyperlipidemia 12/23/2011  . Essential hypertension 03/23/2011  . Complete heart block ([place]) 03/23/2011  . Complete atrioventricular block ([place]) 03/23/2011   Phillips Grout PT, DPT   Olita Takeshita 03/25/2017, 5:15 PM  [place] MAIN [facility] SERVICES [address] [place], [place], [address] Phone: [phone]   Fax:  [phone]  Name: Howard [name] MRN: [id number] Date of Birth: [birth year]

## 2017-03-28 ENCOUNTER — Emergency Department: Payer: Medicare Other

## 2017-03-28 ENCOUNTER — Emergency Department
Admission: EM | Admit: 2017-03-28 | Discharge: 2017-03-28 | Disposition: A | Discharge status: Home / Self Care | Payer: Medicare Other | Attending: Emergency Medicine | Admitting: Emergency Medicine

## 2017-03-28 ENCOUNTER — Ambulatory Visit: Payer: Medicare Other

## 2017-03-28 VITALS — BP 152/73 | HR 63

## 2017-03-28 DIAGNOSIS — Y92009 Unspecified place in unspecified non-institutional (private) residence as the place of occurrence of the external cause: Secondary | ICD-10-CM | POA: Insufficient documentation

## 2017-03-28 DIAGNOSIS — I110 Hypertensive heart disease with heart failure: Secondary | ICD-10-CM | POA: Insufficient documentation

## 2017-03-28 DIAGNOSIS — E119 Type 2 diabetes mellitus without complications: Secondary | ICD-10-CM | POA: Diagnosis not present

## 2017-03-28 DIAGNOSIS — W1809XA Striking against other object with subsequent fall, initial encounter: Secondary | ICD-10-CM | POA: Diagnosis not present

## 2017-03-28 DIAGNOSIS — I5042 Chronic combined systolic (congestive) and diastolic (congestive) heart failure: Secondary | ICD-10-CM | POA: Insufficient documentation

## 2017-03-28 DIAGNOSIS — S0990XA Unspecified injury of head, initial encounter: Secondary | ICD-10-CM | POA: Diagnosis present

## 2017-03-28 DIAGNOSIS — R220 Localized swelling, mass and lump, head: Secondary | ICD-10-CM | POA: Diagnosis not present

## 2017-03-28 DIAGNOSIS — S0083XA Contusion of other part of head, initial encounter: Secondary | ICD-10-CM | POA: Diagnosis not present

## 2017-03-28 DIAGNOSIS — Z8546 Personal history of malignant neoplasm of prostate: Secondary | ICD-10-CM | POA: Diagnosis not present

## 2017-03-28 DIAGNOSIS — Y9389 Activity, other specified: Secondary | ICD-10-CM | POA: Diagnosis not present

## 2017-03-28 DIAGNOSIS — Y999 Unspecified external cause status: Secondary | ICD-10-CM | POA: Insufficient documentation

## 2017-03-28 DIAGNOSIS — Z87891 Personal history of nicotine dependence: Secondary | ICD-10-CM | POA: Diagnosis not present

## 2017-03-28 DIAGNOSIS — Z7982 Long term (current) use of aspirin: Secondary | ICD-10-CM | POA: Diagnosis not present

## 2017-03-28 DIAGNOSIS — R42 Dizziness and giddiness: Secondary | ICD-10-CM

## 2017-03-28 DIAGNOSIS — Z7984 Long term (current) use of oral hypoglycemic drugs: Secondary | ICD-10-CM | POA: Diagnosis not present

## 2017-03-28 MED ORDER — HYDROMORPHONE HCL 1 MG/ML IJ SOLN
INTRAMUSCULAR | Status: AC
  Filled 2017-03-28: qty 1

## 2017-03-28 NOTE — ED Provider Notes (Signed)
[facility] Emergency Department Provider Note   ____________________________________________   First MD Initiated Contact with Patient 03/28/17 1326     (approximate)  I have reviewed the triage vital signs and the nursing notes.   HISTORY  Chief Complaint Eye Injury    HPI Howard [name] is a 83 y.o. male reports that he has a history of vertigo. Yesterday he was reaching to kill a bug with a piece of paper towel, he leaned forward lost his balance and struck his head on the edge of a windowsill. He struck over the right side of his forehead and around his right eye which was bruised. He denies any headache or neck pain. No numbness tingling or weakness. He noticed earlier today that his right eye vision seem just a little bit "fuzzy" that he blinked a little bit and eighth there was a slight film on it that seemed to get better. He went to physical therapy, they evaluated him and recommended that he come to the emergency department for further evaluation.  At the present timeI reports his vision seems better. He does wear eyeglasses. He has not noticed any pain in the eye. He has had previous cataract surgery. Denies any double vision. No black spots or loss of vision.   Past Medical History:  Diagnosis Date  . Arthritis   . Bell palsy   . Cancer [facility])    prostate and skin  . Chronic combined systolic and diastolic CHF, NYHA class 1 ([place])    a. 07/2014 Echo: EF 35-40%, Gr 1 DD.  Marland Kitchen Complete heart block ([place])    a. 11/2010 s/p SJM 2210 Accent DC PPM, ser# [id number].  . Depression   . Diabetes mellitus without complication ([place])   . Fall 11-10-14  . GERD (gastroesophageal reflux disease)   . History of prostate cancer   . Hyperlipidemia   . Hypertension   . LBBB (left bundle branch block)   . Left-sided Bell's palsy   . Lung cancer ([place]) 2016  . NICM (nonischemic cardiomyopathy) ([place])    a. 07/2014 Echo: EF 35-40%, mid-apicalanteroseptal DK, Gr 1  DD, mild-mod dil LA.  . Non-obstructive CAD    a. 07/2014 Abnl MV;  b. 08/2014 Cath: LM nl, LAD 30p, RI 40p, LCX nl, OM1 40, RCA dominant 30p, 70d-->Med Rx.  Marland Kitchen Poor balance   . Presence of permanent cardiac pacemaker   . Sleep apnea    a. cpap  . Vertigo   . WPW (Wolff-Parkinson-White syndrome)    a. S/P RFCA 1991.    Patient Active Problem List   Diagnosis Date Noted  . Malignant neoplasm of right lung ([place]) 04/18/2016  . CAD (coronary artery disease) 04/01/2016  . Adenocarcinoma ([place]) 04/01/2016  . Skin cyst 12/26/2015  . GERD (gastroesophageal reflux disease) 06/16/2015  . Abscess of back 06/06/2015  . Vertigo 03/27/2015  . Carpal tunnel syndrome 10/28/2014  . Status post cholecystectomy 09/29/2014  . Disease of digestive tract 09/29/2014  . Nonischemic cardiomyopathy ([place]) 09/26/2014  . Cardiomyopathy ([place]) 09/26/2014  . Diabetes mellitus without complication ([place])   . Sleep apnea   . Spinal stenosis, lumbar region, with neurogenic claudication 06/07/2014  . Lumbar stenosis with neurogenic claudication 06/07/2014  . Abnormal gait 08/20/2012  . H/O total knee replacement 08/20/2012  . Arthritis of knee, degenerative 08/20/2012  . Pacemaker-St.Jude 08/03/2012  . Cardiac conduction disorder 06/19/2012  . Acid reflux 06/18/2012  . 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 Triggering of digit  03/25/2012  . Hyperlipidemia 12/23/2011  . Essential hypertension 03/23/2011  . Complete heart block ([place]) 03/23/2011  . Complete atrioventricular block ([place]) 03/23/2011    Past Surgical History:  Procedure Laterality Date  . APPLICATION OF WOUND VAC Left 06/07/2015   Procedure: APPLICATION OF WOUND VAC;  Surgeon: Robert Bellow, MD;  Location: [facility] ORS;  Service: General;  Laterality: Left;  left upper back  . BACK SURGERY     2011  . CARDIAC CATHETERIZATION  08/26/2014   Single vessel obstructive CAD  . CARPAL TUNNEL RELEASE  04-04-15   [facility]  . CATARACT EXTRACTION  07-31-11 and  09-18-11  . Catheter ablation  1991   for WPW  . cervical fusion    . CHOLECYSTECTOMY  09-07-14  . HAND SURGERY     right 1993; left 2005  . HERNIA REPAIR  1955  . INSERT / REPLACE / REMOVE PACEMAKER    . JOINT REPLACEMENT Left 2013   knee  . JOINT REPLACEMENT Right 2004   knee  . KNEE SURGERY     left knee 1991 and 1992; right knee 1995  . LEFT HEART CATHETERIZATION WITH CORONARY ANGIOGRAM N/A 08/26/2014   Procedure: LEFT HEART CATHETERIZATION WITH CORONARY ANGIOGRAM;  Surgeon: [name], MD;  Location: [facility] CATH LAB;  Service: Cardiovascular;  Laterality: N/A;  . LUMBAR LAMINECTOMY/DECOMPRESSION MICRODISCECTOMY N/A 06/07/2014   Procedure: LUMBAR FOUR TO FIVE LUMBAR LAMINECTOMY/DECOMPRESSION MICRODISCECTOMY 1 LEVEL;  Surgeon: [name], MD;  Location: [place] NEURO ORS;  Service: Neurosurgery;  Laterality: N/A;  . LUNG BIOPSY Right 2016   Dr [name]  . PACEMAKER INSERTION     PPM-- St Jude 11/30/10 by [name]  . PROSTATE SURGERY     cancer--1998, prostatectomy  . REPLACEMENT TOTAL KNEE     2004  . ruptured disc     1962 and 1998  . TRIGGER FINGER RELEASE  01-24-15  . WOUND DEBRIDEMENT Left 06/07/2015   Procedure: DEBRIDEMENT WOUND;  Surgeon: Robert Bellow, MD;  Location: [facility] ORS;  Service: General;  Laterality: Left;  left upper back    Prior to Admission medications   Medication Sig Start Date End Date Taking? Authorizing Provider  amLODipine (NORVASC) 5 MG tablet TAKE 1 TABLET DAILY 11/11/16   [name] Chrismon, PA  aspirin 81 MG tablet Take 81 mg by mouth daily.      Historical Provider, MD  Cholecalciferol (VITAMIN D3) 1000 UNITS CAPS Take 1,000 Units by mouth daily.     Historical Provider, MD  Colesevelam HCl [facility]) 3.75 G PACK Take 3.75 g by mouth daily. Mix in [place] of water and drink.    Historical Provider, MD  hydrochlorothiazide (HYDRODIURIL) 25 MG tablet TAKE 1 TABLET DAILY 01/20/17   [name] Chrismon, PA  lisinopril (PRINIVIL,ZESTRIL) 10 MG tablet Take 10 mg by mouth  daily.    Historical Provider, MD  metFORMIN (GLUCOPHAGE) 500 MG tablet TAKE 1 TABLET TWICE A DAY 01/14/17   [name] Chrismon, PA  metoprolol succinate (TOPROL-XL) 25 MG 24 hr tablet Take 25 mg by mouth daily.    Historical Provider, MD  Multiple Vitamin (MULTIVITAMIN) capsule Take 1 capsule by mouth daily.      Historical Provider, MD  omeprazole (PRILOSEC) 40 MG capsule Take 1 capsule (40 mg total) by mouth 2 (two) times daily. 04/18/16   [name] Chrismon, PA  simvastatin (ZOCOR) 40 MG tablet Take 1 tablet (40 mg total) by mouth at bedtime. 12/17/16   [name], MD  Allergies Sulfonamide derivatives  Family History  Problem Relation Age of Onset  . Heart attack Mother   . Hyperlipidemia Mother   . CAD    . Prostate cancer Neg Hx     Social History Social History  Substance Use Topics  . Smoking status: Former Smoker    Years: 4.00  . Smokeless tobacco: Never Used     Comment: Quit 2011  . Alcohol use 0.6 oz/week    1 Cans of beer per week     Comment: occasional    Review of Systems Constitutional: No fever/chills. Denies recent illness. Eyes: See history of present illness ENT: No sore throat. Cardiovascular: Denies chest pain. Respiratory: Denies shortness of breath. Gastrointestinal: No abdominal pain.   Musculoskeletal: Negative for back pain or neck pain. Skin: Negative for rash except for some bruising and a goose egg over his right forehead. In addition he has stitches over his left ear which she is supposed to have taken out for removal of a squamous cell cancer with his doctor this week. Neurological: Negative for headaches, focal weakness or numbness.  10-point ROS otherwise negative.  ____________________________________________   PHYSICAL EXAM:  VITAL SIGNS: ED Triage Vitals  Enc Vitals Group     BP 03/28/17 1111 (!) 168/71     Pulse Rate 03/28/17 1110 63     Resp 03/28/17 1110 16     Temp 03/28/17 1110 98.1 F (36.7 C)     Temp Source  03/28/17 1110 Oral     SpO2 03/28/17 1110 96 %     Weight 03/28/17 1110 190 lb (86.2 kg)     Height 03/28/17 1110 '5\' 9"'$  (1.753 m)     Head Circumference --      Peak Flow --      Pain Score 03/28/17 1109 5     Pain Loc --      Pain Edu? --      Excl. in [place]? --     Constitutional: Alert and oriented. Well appearing and in no acute distress.Patient, his wife and his son are very pleasant. Eyes: Conjunctivae are normal. PERRL. EOMI. Head: Atraumatic except for a small hematoma noted over the right forehead with some slight ecchymosis overlying the right orbit.Marland Kitchen No proptosis. No diplopia. Normal extraocular movements. Patient able to read with the right eye isolated. No retinal hemorrhage noted on ophthalmologic/ophthalmoscope exam. Crisp retinas noted bilaterally. Nose: No congestion/rhinnorhea. Mouth/Throat: Mucous membranes are moist.  Oropharynx non-erythematous. Neck: No stridor.  No cervical tenderness. An old midline surgical scar is noted Cardiovascular: Normal rate, regular rhythm. Grossly normal heart sounds.  Good peripheral circulation. Respiratory: Normal respiratory effort.  No retractions. Lungs CTAB. Gastrointestinal: Soft and nontender.  Musculoskeletal: No lower extremity tenderness nor edema. Moves all extremities with normal strength.  Neurologic:  Normal speech and language. No gross focal neurologic deficits are appreciated. No gait instability. Patient sits up slowly and intentionally holding his head somewhat still, reports he's been dealing with vertigo for some time now and has to be very careful with his movements otherwise he will feel dizzy. Denies dizziness here. Skin:  Skin is warm, dry and intact. No rash noted. Psychiatric: Mood and affect are normal. Speech and behavior are normal.    Visual Acuity  Right Eye Distance: 20/40 Left Eye Distance: 20/25 Bilateral Distance:    Right Eye Near:   Left Eye Near:    Bilateral Near:      ____________________________________________   LABS (all labs ordered are  listed, but only abnormal results are displayed)  Labs Reviewed - No data to display ____________________________________________  EKG   ____________________________________________  RADIOLOGY  Ct Head Wo Contrast  Result Date: 03/28/2017 CLINICAL DATA:  Fall.  Abnormal right vision EXAM: CT HEAD WITHOUT CONTRAST CT MAXILLOFACIAL WITHOUT CONTRAST TECHNIQUE: Multidetector CT imaging of the head and maxillofacial structures were performed using the standard protocol without intravenous contrast. Multiplanar CT image reconstructions of the maxillofacial structures were also generated. COMPARISON:  CT head 05/01/2015 FINDINGS: CT HEAD FINDINGS Brain: Moderate to advanced atrophy. Negative for acute infarct. Negative for acute hemorrhage or mass. Mild chronic ischemic change in the white matter. Vascular: Negative for hyperdense vessel. Skull: Negative for fracture.  Right frontal scalp hematoma. Other: None CT MAXILLOFACIAL FINDINGS Osseous: Negative for facial fracture. No fracture of the orbit or mandible. Negative for nasal bone fracture. Orbits: Bilateral cataract extraction.  No orbital mass or hematoma. Sinuses: Opacification of the left sphenoid sinus. Remaining sinuses clear. Soft tissues: Soft tissue swelling right frontal scalp. Mild soft tissue swelling over the right orbit. IMPRESSION: No acute intracranial abnormality. Atrophy with chronic microvascular ischemia Negative for facial fracture Right frontal and orbital soft tissue swelling. Electronically Signed   By: Franchot Gallo M.D.   On: 03/28/2017 12:05   Ct Maxillofacial Wo Contrast  Result Date: 03/28/2017 CLINICAL DATA:  Fall.  Abnormal right vision EXAM: CT HEAD WITHOUT CONTRAST CT MAXILLOFACIAL WITHOUT CONTRAST TECHNIQUE: Multidetector CT imaging of the head and maxillofacial structures were performed using the standard protocol without intravenous  contrast. Multiplanar CT image reconstructions of the maxillofacial structures were also generated. COMPARISON:  CT head 05/01/2015 FINDINGS: CT HEAD FINDINGS Brain: Moderate to advanced atrophy. Negative for acute infarct. Negative for acute hemorrhage or mass. Mild chronic ischemic change in the white matter. Vascular: Negative for hyperdense vessel. Skull: Negative for fracture.  Right frontal scalp hematoma. Other: None CT MAXILLOFACIAL FINDINGS Osseous: Negative for facial fracture. No fracture of the orbit or mandible. Negative for nasal bone fracture. Orbits: Bilateral cataract extraction.  No orbital mass or hematoma. Sinuses: Opacification of the left sphenoid sinus. Remaining sinuses clear. Soft tissues: Soft tissue swelling right frontal scalp. Mild soft tissue swelling over the right orbit. IMPRESSION: No acute intracranial abnormality. Atrophy with chronic microvascular ischemia Negative for facial fracture Right frontal and orbital soft tissue swelling. Electronically Signed   By: Franchot Gallo M.D.   On: 03/28/2017 12:05    ____________________________________________   PROCEDURES  Procedure(s) performed: None  Procedures  Critical Care performed: No  ____________________________________________   INITIAL IMPRESSION / ASSESSMENT AND PLAN / ED COURSE  Pertinent labs & imaging results that were available during my care of the patient were reviewed by me and considered in my medical decision making (see chart for details).  Patient was for evaluation of a fall with head injury. CT is negative for acute, he is not on any anticoagulants. Had no loss of consciousness chest pain or trouble breathing preceding. Reports he lost his balance due to vertigo, which is a chronic issue. No neurologic deficits.  In addition, the patient noted decreasing somewhat clouding of his vision over the last couple days as well. On exam, I find no obvious evidence of ophthalmologic injury. No evidence  of retinal detachment is noted. No evidence of hemorrhage. No proptosis. He has slight decrease in acuity in the right eye, and I discussed with him and he will follow closely with ophthalmology. Careful return precautions advised the patient is family.  Return  precautions and treatment recommendations and follow-up discussed with the patient who is agreeable with the plan.       ____________________________________________   FINAL CLINICAL IMPRESSION(S) / ED DIAGNOSES  Final diagnoses:  Facial contusion, initial encounter      NEW MEDICATIONS STARTED DURING THIS VISIT:  New Prescriptions   No medications on file     Note:  This document was prepared using Dragon voice recognition software and may include unintentional dictation errors.     Delman Kitten, MD 03/28/17 [phone]

## 2017-03-28 NOTE — ED Triage Notes (Signed)
Pt reports to ED from PT downstairs.  Pt sts that yesterday, he was leaning over and fell, denies LOC.  Pt has hematoma to R forehead and bruising to R eye.  Pt was brought up from PT because he complained about fuzzy vision to R eye. Pt A/OX4, from home, resp even and unlabored.  NAD

## 2017-03-28 NOTE — Therapy (Signed)
[place] MAIN [facility] SERVICES [address] [place], [place], [address] Phone: [phone]   Fax:  [phone]  Physical Therapy Treatment  Patient Details  Name: Howard [name] MRN: [id number] Date of Birth: 02-07-1931 Referring Provider: Dr. Pati Gallo  Encounter Date: 03/28/2017    Past Medical History:  Diagnosis Date  . Arthritis   . Bell palsy   . Cancer [facility])    prostate and skin  . Chronic combined systolic and diastolic CHF, NYHA class 1 ([place])    a. 07/2014 Echo: EF 35-40%, Gr 1 DD.  Marland Kitchen Complete heart block ([place])    a. 11/2010 s/p SJM 2210 Accent DC PPM, ser# [id number].  . Depression   . Diabetes mellitus without complication ([place])   . Fall 11-10-14  . GERD (gastroesophageal reflux disease)   . History of prostate cancer   . Hyperlipidemia   . Hypertension   . LBBB (left bundle branch block)   . Left-sided Bell's palsy   . Lung cancer ([place]) 2016  . NICM (nonischemic cardiomyopathy) ([place])    a. 07/2014 Echo: EF 35-40%, mid-apicalanteroseptal DK, Gr 1 DD, mild-mod dil LA.  . Non-obstructive CAD    a. 07/2014 Abnl MV;  b. 08/2014 Cath: LM nl, LAD 30p, RI 40p, LCX nl, OM1 40, RCA dominant 30p, 70d-->Med Rx.  Marland Kitchen Poor balance   . Presence of permanent cardiac pacemaker   . Sleep apnea    a. cpap  . Vertigo   . WPW (Wolff-Parkinson-White syndrome)    a. S/P RFCA 1991.    Past Surgical History:  Procedure Laterality Date  . APPLICATION OF WOUND VAC Left 06/07/2015   Procedure: APPLICATION OF WOUND VAC;  Surgeon: Robert Bellow, MD;  Location: [facility] ORS;  Service: General;  Laterality: Left;  left upper back  . BACK SURGERY     2011  . CARDIAC CATHETERIZATION  08/26/2014   Single vessel obstructive CAD  . CARPAL TUNNEL RELEASE  04-04-15   [facility]  . CATARACT EXTRACTION  07-31-11 and 09-18-11  . Catheter ablation  1991   for WPW  . cervical fusion    . CHOLECYSTECTOMY  09-07-14  . HAND SURGERY     right 1993; left 2005  . HERNIA REPAIR   1955  . INSERT / REPLACE / REMOVE PACEMAKER    . JOINT REPLACEMENT Left 2013   knee  . JOINT REPLACEMENT Right 2004   knee  . KNEE SURGERY     left knee 1991 and 1992; right knee 1995  . LEFT HEART CATHETERIZATION WITH CORONARY ANGIOGRAM N/A 08/26/2014   Procedure: LEFT HEART CATHETERIZATION WITH CORONARY ANGIOGRAM;  Surgeon: [name], MD;  [facility];  Service: Cardiovascular;  Laterality: N/A;  . LUMBAR LAMINECTOMY/DECOMPRESSION MICRODISCECTOMY N/A 06/07/2014   Procedure: LUMBAR FOUR TO FIVE LUMBAR LAMINECTOMY/DECOMPRESSION MICRODISCECTOMY 1 LEVEL;  Surgeon: [name], MD;  Location: [place] NEURO ORS;  Service: Neurosurgery;  Laterality: N/A;  . LUNG BIOPSY Right 2016   Dr [name]  . PACEMAKER INSERTION     PPM-- St Jude 11/30/10 by [name]  . PROSTATE SURGERY     cancer--1998, prostatectomy  . REPLACEMENT TOTAL KNEE     2004  . ruptured disc     1962 and 1998  . TRIGGER FINGER RELEASE  01-24-15  . WOUND DEBRIDEMENT Left 06/07/2015   Procedure: DEBRIDEMENT WOUND;  Surgeon: Robert Bellow, MD;  Location: [facility] ORS;  Service: General;  Laterality: Left;  left upper back  Vitals:   03/28/17 1050  BP: (!) 152/73  Pulse: 63  SpO2: 97%        Subjective Assessment - 03/28/17 1134    Subjective Pt arrived for his appointment reporting a fall with head injury yesterday. He states that he fell forward while chasing a roach. Hematoma noted on R forehead and significant periorbital swelling around R eye.  Pt reports blurred vision in R eye since fall yesterday. No numbness/tingling bilateral UE/LE, dysphagia, dysarthria, visual field cut, unilateral weakness, or any other signs other than bruising, swelling, and blurred vision.    Pertinent History Patient states that "you got that stuff straightened out" in 2015 in reference to prior vestibular therapy for dizziness and vertigo. Patient states he had a fall on March 3rd when he got up at 3 am to let the dog out. Patient states he  does not remember whether he was dizzy or not prior to or after the fall. Patient reports this is why he is returning to vestibular therapy. Patient states he has not been having vertigo or dizziness, but rather balance issues primarily at present. Patient states he has not been using any AD for mobility.    Limitations Sitting   Patient Stated Goals Patient would like to be able to install an overhead lightbulb, lie in the prone position to shoot a rifle, be able to shave his chin and be able to get on the floor and look up underneath a table         Pt with intact sensation and strength bilaterally. Facial symmetry intact. Ocular alignment normal without dilation noted. Full ocular ROM. Denies neurological questioning. Contacted PCP who is [employer]. Pt to go to ED to rule out orbital fracture. Pt transported personally by PT to [facility] ED. Care transferred to ER staff. No PT session to be performed on this date.                         PT Education - 03/28/17 1138    Education provided Yes   Education Details Go to ER   Person(s) Educated Patient   Methods Explanation   Comprehension Verbalized understanding             PT Long Term Goals - 03/05/17 1014      PT LONG TERM GOAL #1   Title Patient will demonstrate reduced falls risk as evidenced by Dynamic Gait Index (DGI) >19/24 by 04/29/2017.   Baseline scored 16/24 on 03/04/2017   Time 8   Period Weeks   Status New     PT LONG TERM GOAL #2   Title Patient will reduce perceived disability to low levels as indicated by <40 on Dizziness Handicap Inventory.   Baseline scored 46/100 on 03/04/2017   Time 8   Period Weeks   Status New     PT LONG TERM GOAL #3   Title Patient reports no vertigo with provoking motions or positions.   Time 8   Period Weeks   Status New     PT LONG TERM GOAL #4   Title Patient will report 50% or greater improvement in her symptoms of dizziness and imbalance with  provoking motions or positions by 04/29/2017.   Time 8   Period Weeks   Status New     PT LONG TERM GOAL #5   Title Patient will have demonstrate decreased falls risk as indicated by Activities Specific Balance Confidence Scale score of  80% or greater.   Baseline scored 62.5% on 03/04/2017     Additional Long Term Goals   Additional Long Term Goals Yes     PT LONG TERM GOAL #6   Title Patient will be able to shave without symptoms of dizziness.    Time 8   Period Weeks   Status New             Patient will benefit from skilled therapeutic intervention in order to improve the following deficits and impairments:     Visit Diagnosis: Dizziness and giddiness     Problem List Patient Active Problem List   Diagnosis Date Noted  . Malignant neoplasm of right lung ([place]) 04/18/2016  . CAD (coronary artery disease) 04/01/2016  . Adenocarcinoma ([place]) 04/01/2016  . Skin cyst 12/26/2015  . GERD (gastroesophageal reflux disease) 06/16/2015  . Abscess of back 06/06/2015  . Vertigo 03/27/2015  . Carpal tunnel syndrome 10/28/2014  . Status post cholecystectomy 09/29/2014  . Disease of digestive tract 09/29/2014  . Nonischemic cardiomyopathy ([place]) 09/26/2014  . Cardiomyopathy ([place]) 09/26/2014  . Diabetes mellitus without complication ([place])   . Sleep apnea   . Spinal stenosis, lumbar region, with neurogenic claudication 06/07/2014  . Lumbar stenosis with neurogenic claudication 06/07/2014  . Abnormal gait 08/20/2012  . H/O total knee replacement 08/20/2012  . Arthritis of knee, degenerative 08/20/2012  . Pacemaker-St.Jude 08/03/2012  . Cardiac conduction disorder 06/19/2012  . Acid reflux 06/18/2012  . 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 Triggering of digit 03/25/2012  . Hyperlipidemia 12/23/2011  . Essential hypertension 03/23/2011  . Complete heart block ([place]) 03/23/2011  . Complete atrioventricular block ([place]) 03/23/2011   Phillips Grout PT, DPT    Danayah Smyre 03/28/2017, 11:44 AM  [place] MAIN [facility] SERVICES [address] [place], [place], [address] Phone: [phone]   Fax:  [phone]  Name: Howard [name] MRN: [id number] Date of Birth: [birth year]

## 2017-03-28 NOTE — Discharge Instructions (Signed)
You were seen in the Emergency Department (ED) today for a head injury.    Signs of a more serious head injury include vomiting, severe headache, excessive sleepiness or confusion, and weakness or numbness in your face, arms or legs, loss of vision, areas on your vision that are blind or if you begin to see bright or black spots in the eye.  Return immediately to the Emergency Department if you experience any of these more concerning symptoms.

## 2017-03-31 DIAGNOSIS — C4442 Squamous cell carcinoma of skin of scalp and neck: Secondary | ICD-10-CM | POA: Diagnosis not present

## 2017-03-31 DIAGNOSIS — Z85828 Personal history of other malignant neoplasm of skin: Secondary | ICD-10-CM | POA: Diagnosis not present

## 2017-04-01 ENCOUNTER — Encounter: Payer: Medicare Other | Admitting: Physical Therapy

## 2017-04-02 ENCOUNTER — Ambulatory Visit (INDEPENDENT_AMBULATORY_CARE_PROVIDER_SITE_OTHER): Payer: Medicare Other | Admitting: *Deleted

## 2017-04-02 DIAGNOSIS — I442 Atrioventricular block, complete: Secondary | ICD-10-CM | POA: Diagnosis not present

## 2017-04-02 DIAGNOSIS — E119 Type 2 diabetes mellitus without complications: Secondary | ICD-10-CM | POA: Diagnosis not present

## 2017-04-02 LAB — HM DIABETES EYE EXAM

## 2017-04-02 NOTE — Progress Notes (Signed)
Remote pacemaker transmission.   

## 2017-04-03 ENCOUNTER — Encounter: Payer: Self-pay | Admitting: Family Medicine

## 2017-04-03 ENCOUNTER — Encounter: Payer: Self-pay | Admitting: Cardiology

## 2017-04-04 ENCOUNTER — Encounter: Payer: Medicare Other | Admitting: Physical Therapy

## 2017-04-04 LAB — CUP PACEART REMOTE DEVICE CHECK
Battery Remaining Longevity: 36 mo
Battery Remaining Percentage: 40 %
Battery Voltage: 2.84 V
Brady Statistic AP VP Percent: 3.7 %
Brady Statistic AS VP Percent: 96 %
Brady Statistic RA Percent Paced: 3.2 %
Brady Statistic RV Percent Paced: 99 %
Implantable Lead Implant Date: 20111208
Implantable Lead Location: 753859
Implantable Lead Location: 753860
Implantable Lead Model: 1948
Lead Channel Impedance Value: 360 Ohm
Lead Channel Impedance Value: 430 Ohm
Lead Channel Pacing Threshold Amplitude: 0.75 V
Lead Channel Pacing Threshold Pulse Width: 0.4 ms
Lead Channel Sensing Intrinsic Amplitude: 12 mV
Lead Channel Sensing Intrinsic Amplitude: 3.2 mV
Lead Channel Setting Pacing Amplitude: 2 V
Lead Channel Setting Sensing Sensitivity: 4 mV
MDC IDC LEAD IMPLANT DT: 20111208
MDC IDC MSMT LEADCHNL RV PACING THRESHOLD AMPLITUDE: 0.5 V
MDC IDC MSMT LEADCHNL RV PACING THRESHOLD PULSEWIDTH: 0.8 ms
MDC IDC PG IMPLANT DT: 20111208
MDC IDC PG SERIAL: 7196739
MDC IDC SESS DTM: 20180411075640
MDC IDC SET LEADCHNL RV PACING AMPLITUDE: 2.5 V
MDC IDC SET LEADCHNL RV PACING PULSEWIDTH: 0.8 ms
MDC IDC STAT BRADY AP VS PERCENT: 1 %
MDC IDC STAT BRADY AS VS PERCENT: 1 %

## 2017-04-05 ENCOUNTER — Other Ambulatory Visit: Payer: Self-pay | Admitting: Internal Medicine

## 2017-04-05 ENCOUNTER — Other Ambulatory Visit: Payer: Self-pay | Admitting: Family Medicine

## 2017-04-05 DIAGNOSIS — K2100 Gastro-esophageal reflux disease with esophagitis, without bleeding: Secondary | ICD-10-CM

## 2017-04-08 ENCOUNTER — Encounter: Payer: Medicare Other | Admitting: Physical Therapy

## 2017-04-15 ENCOUNTER — Encounter: Payer: Self-pay | Admitting: Family Medicine

## 2017-04-15 ENCOUNTER — Ambulatory Visit (INDEPENDENT_AMBULATORY_CARE_PROVIDER_SITE_OTHER): Payer: Medicare Other | Admitting: Family Medicine

## 2017-04-15 ENCOUNTER — Ambulatory Visit: Payer: Medicare Other | Admitting: Physical Therapy

## 2017-04-15 VITALS — BP 134/64 | HR 65 | Temp 98.3°F | Wt 193.0 lb

## 2017-04-15 DIAGNOSIS — I2510 Atherosclerotic heart disease of native coronary artery without angina pectoris: Secondary | ICD-10-CM

## 2017-04-15 DIAGNOSIS — K2100 Gastro-esophageal reflux disease with esophagitis, without bleeding: Secondary | ICD-10-CM

## 2017-04-15 DIAGNOSIS — C3411 Malignant neoplasm of upper lobe, right bronchus or lung: Secondary | ICD-10-CM

## 2017-04-15 MED ORDER — SUCRALFATE 1 G PO TABS: 1.0000 g | ORAL_TABLET | Freq: Three times a day (TID) | ORAL | 0 refills | Status: DC

## 2017-04-15 NOTE — Progress Notes (Signed)
Patient: Howard [name] Male    DOB: 07/18/1931   83 y.o.   MRN: [id number] Visit Date: 04/15/2017  Today's Provider: [name], PA   Chief Complaint  Patient presents with  . Gastroesophageal Reflux   Subjective:    Gastroesophageal Reflux  He complains of coughing (productive) and heartburn. This is a chronic problem. Episode onset: episode last night. The heartburn duration is more than one hour. The heartburn is located in the substernum. The heartburn is of severe intensity. The heartburn wakes him from sleep. He has tried an antacid for the symptoms.  Patient currently takes Omeprazole 40 mg for GERD. Patient reports the episode he had last night kept him awake. He was also coughing up blood last week while having a episode.  Past Medical History:  Diagnosis Date  . Arthritis   . Bell palsy   . Cancer [facility])    prostate and skin  . Chronic combined systolic and diastolic CHF, NYHA class 1 ([place])    a. 07/2014 Echo: EF 35-40%, Gr 1 DD.  Marland Kitchen Complete heart block ([place])    a. 11/2010 s/p SJM 2210 Accent DC PPM, ser# [id number].  . Depression   . Diabetes mellitus without complication ([place])   . Fall 11-10-14  . GERD (gastroesophageal reflux disease)   . History of prostate cancer   . Hyperlipidemia   . Hypertension   . LBBB (left bundle branch block)   . Left-sided Bell's palsy   . Lung cancer ([place]) 2016  . NICM (nonischemic cardiomyopathy) ([place])    a. 07/2014 Echo: EF 35-40%, mid-apicalanteroseptal DK, Gr 1 DD, mild-mod dil LA.  . Non-obstructive CAD    a. 07/2014 Abnl MV;  b. 08/2014 Cath: LM nl, LAD 30p, RI 40p, LCX nl, OM1 40, RCA dominant 30p, 70d-->Med Rx.  Marland Kitchen Poor balance   . Presence of permanent cardiac pacemaker   . Sleep apnea    a. cpap  . Vertigo   . WPW (Wolff-Parkinson-White syndrome)    a. S/P RFCA 1991.   Past Surgical History:  Procedure Laterality Date  . APPLICATION OF WOUND VAC Left 06/07/2015   Procedure: APPLICATION OF WOUND VAC;  Surgeon: Robert Bellow, MD;  Location: [facility] ORS;  Service: General;  Laterality: Left;  left upper back  . BACK SURGERY     2011  . CARDIAC CATHETERIZATION  08/26/2014   Single vessel obstructive CAD  . CARPAL TUNNEL RELEASE  04-04-15   [facility]  . CATARACT EXTRACTION  07-31-11 and 09-18-11  . Catheter ablation  1991   for WPW  . cervical fusion    . CHOLECYSTECTOMY  09-07-14  . HAND SURGERY     right 1993; left 2005  . HERNIA REPAIR  1955  . INSERT / REPLACE / REMOVE PACEMAKER    . JOINT REPLACEMENT Left 2013   knee  . JOINT REPLACEMENT Right 2004   knee  . KNEE SURGERY     left knee 1991 and 1992; right knee 1995  . LEFT HEART CATHETERIZATION WITH CORONARY ANGIOGRAM N/A 08/26/2014   Procedure: LEFT HEART CATHETERIZATION WITH CORONARY ANGIOGRAM;  Surgeon: [name], MD;  Location: [facility] CATH LAB;  Service: Cardiovascular;  Laterality: N/A;  . LUMBAR LAMINECTOMY/DECOMPRESSION MICRODISCECTOMY N/A 06/07/2014   Procedure: LUMBAR FOUR TO FIVE LUMBAR LAMINECTOMY/DECOMPRESSION MICRODISCECTOMY 1 LEVEL;  Surgeon: [name], MD;  Location: [place] NEURO ORS;  Service: Neurosurgery;  Laterality: N/A;  . LUNG BIOPSY Right 2016   Dr [name]  .  PACEMAKER INSERTION     PPM-- St Jude 11/30/10 by [name]  . PROSTATE SURGERY     cancer--1998, prostatectomy  . REPLACEMENT TOTAL KNEE     2004  . ruptured disc     1962 and 1998  . TRIGGER FINGER RELEASE  01-24-15  . WOUND DEBRIDEMENT Left 06/07/2015   Procedure: DEBRIDEMENT WOUND;  Surgeon: Robert Bellow, MD;  Location: [facility] ORS;  Service: General;  Laterality: Left;  left upper back   Family History  Problem Relation Age of Onset  . Heart attack Mother   . Hyperlipidemia Mother   . CAD    . Prostate cancer Neg Hx    Allergies  Allergen Reactions  . Sulfonamide Derivatives Rash     Previous Medications   AMLODIPINE (NORVASC) 5 MG TABLET    TAKE 1 TABLET DAILY   ASPIRIN 81 MG TABLET    Take 81 mg by mouth daily.     CHOLECALCIFEROL (VITAMIN D3) 1000 UNITS CAPS    Take  1,000 Units by mouth daily.    COLESEVELAM HCL (WELCHOL) 3.75 G PACK    Take 3.75 g by mouth daily. Mix in [place] of water and drink.   HYDROCHLOROTHIAZIDE (HYDRODIURIL) 25 MG TABLET    TAKE 1 TABLET DAILY   LISINOPRIL (PRINIVIL,ZESTRIL) 10 MG TABLET    Take 10 mg by mouth daily.   METFORMIN (GLUCOPHAGE) 500 MG TABLET    TAKE 1 TABLET TWICE A DAY   METOPROLOL SUCCINATE (TOPROL-XL) 25 MG 24 HR TABLET    TAKE 1 TABLET DAILY   MULTIPLE VITAMIN (MULTIVITAMIN) CAPSULE    Take 1 capsule by mouth daily.     OMEPRAZOLE (PRILOSEC) 40 MG CAPSULE    TAKE 1 CAPSULE TWICE DAILY   SIMVASTATIN (ZOCOR) 40 MG TABLET    Take 1 tablet (40 mg total) by mouth at bedtime.    Review of Systems  Constitutional: Negative.   HENT: Positive for congestion.   Respiratory: Positive for cough (productive).   Cardiovascular: Negative.   Gastrointestinal: Positive for heartburn.       GERD    Social History  Substance Use Topics  . Smoking status: Former Smoker    Years: 4.00  . Smokeless tobacco: Never Used     Comment: Quit 2011  . Alcohol use 0.6 oz/week    1 Cans of beer per week     Comment: occasional   Objective:   BP 134/64 (BP Location: Right Arm, Patient Position: Sitting, Cuff Size: Normal)   Pulse 65   Temp 98.3 F (36.8 C) (Oral)   Wt 193 lb (87.5 kg)   SpO2 93%   BMI 28.50 kg/m   Physical Exam  Constitutional: He is oriented to person, place, and time. He appears well-developed and well-nourished. No distress.  HENT:  Head: Normocephalic and atraumatic.  Right Ear: Hearing normal.  Left Ear: Hearing normal.  Nose: Nose normal.  Eyes: Conjunctivae and lids are normal. Right eye exhibits no discharge. Left eye exhibits no discharge. No scleral icterus.  Cardiovascular: Normal rate and regular rhythm.   Pulmonary/Chest: Effort normal and breath sounds normal. No respiratory distress. He has no wheezes. He has no rales.  Abdominal: Soft. Bowel sounds are normal. He exhibits no distension.  There is no tenderness. There is no rebound.  Neurological: He is alert and oriented to person, place, and time.  Skin: Skin is intact. No lesion and no rash noted.  Psychiatric: He has a normal mood and affect. His  speech is normal and behavior is normal. Thought content normal.      Assessment & Plan:     1. Gastroesophageal reflux disease with esophagitis Heavy dyspepsia last night despite regular use of Omeprazole 40 mg BID. No hematemesis or melena. No persistence today. Had some PND from head congestion causing some coughing and straining yesterday. No fever or persistence today. Had a little blood streak in sputum last night. Will give Carafate slurry and if dyspepsia persists, will need to schedule evaluation by a gastroenterologist for an EGD. - sucralfate (CARAFATE) 1 g tablet; Take 1 tablet (1 g total) by mouth 4 (four) times daily -  with meals and at bedtime. Dissolve tablet in 2 ounces of water and drink slurry.  Dispense: 30 tablet; Refill: 0  2. Malignant neoplasm of upper lobe of right lung ([place]) Scheduled for a follow up scan by Dr. Baruch Gouty (oncologist) tomorrow. States lesion is "gone" since therapy completed December 2016. Has had a couple episodes of blood streaked sputum with a cough in the past couple months.Marland Kitchen

## 2017-04-16 ENCOUNTER — Ambulatory Visit
Admission: RE | Admit: 2017-04-16 | Discharge: 2017-04-16 | Disposition: A | Discharge status: Home / Self Care | Payer: Medicare Other | Source: Ambulatory Visit | Attending: Radiation Oncology | Admitting: Radiation Oncology

## 2017-04-16 DIAGNOSIS — R918 Other nonspecific abnormal finding of lung field: Secondary | ICD-10-CM | POA: Diagnosis not present

## 2017-04-16 DIAGNOSIS — I2510 Atherosclerotic heart disease of native coronary artery without angina pectoris: Secondary | ICD-10-CM | POA: Insufficient documentation

## 2017-04-16 DIAGNOSIS — C3411 Malignant neoplasm of upper lobe, right bronchus or lung: Secondary | ICD-10-CM | POA: Insufficient documentation

## 2017-04-16 LAB — POCT I-STAT CREATININE: CREATININE: 0.8 mg/dL (ref 0.61–1.24)

## 2017-04-16 MED ORDER — IOPAMIDOL (ISOVUE-300) INJECTION 61%
75.0000 mL | Freq: Once | INTRAVENOUS | Status: AC | PRN
  Administered 2017-04-16: 100 mL via INTRAVENOUS

## 2017-04-16 MED ORDER — IOPAMIDOL (ISOVUE-300) INJECTION 61%: 100.0000 mL | Freq: Once | INTRAVENOUS | Status: DC | PRN

## 2017-04-17 ENCOUNTER — Encounter: Payer: Self-pay | Admitting: Cardiology

## 2017-04-18 ENCOUNTER — Encounter: Payer: Self-pay | Admitting: Physical Therapy

## 2017-04-18 ENCOUNTER — Ambulatory Visit: Payer: Medicare Other | Admitting: Physical Therapy

## 2017-04-18 DIAGNOSIS — R296 Repeated falls: Secondary | ICD-10-CM | POA: Diagnosis not present

## 2017-04-18 DIAGNOSIS — R42 Dizziness and giddiness: Secondary | ICD-10-CM

## 2017-04-18 DIAGNOSIS — R262 Difficulty in walking, not elsewhere classified: Secondary | ICD-10-CM | POA: Diagnosis not present

## 2017-04-18 NOTE — Therapy (Signed)
[place] MAIN [facility] SERVICES [address] [place], [place], [address] Phone: [phone]   Fax:  [phone]  Physical Therapy Treatment  Patient Details  Name: Howard [name] MRN: [id number] Date of Birth: 1931/03/07 Referring Provider: Dr. Pati Gallo  Encounter Date: 04/18/2017      PT End of Session - 04/18/17 0911    Visit Number 6   Number of Visits 14   Date for PT Re-Evaluation 04/29/17   Authorization Type Needs G codes   Authorization Time Period 6/10   PT Start Time 0904   PT Stop Time 0956   PT Time Calculation (min) 52 min   Equipment Utilized During Treatment Gait belt   Activity Tolerance Patient tolerated treatment well;Other (comment)  but did have episodes of vertigo requiring rest break   Behavior During Therapy [facility] for tasks assessed/performed      Past Medical History:  Diagnosis Date  . Arthritis   . Bell palsy   . Cancer [facility])    prostate and skin  . Chronic combined systolic and diastolic CHF, NYHA class 1 ([place])    a. 07/2014 Echo: EF 35-40%, Gr 1 DD.  Marland Kitchen Complete heart block ([place])    a. 11/2010 s/p SJM 2210 [facility]ent DC PPM, ser# [id number].  . Depression   . Diabetes mellitus without complication ([place])   . Fall 11-10-14  . GERD (gastroesophageal reflux disease)   . History of prostate cancer   . Hyperlipidemia   . Hypertension   . LBBB (left bundle branch block)   . Left-sided Bell's palsy   . Lung cancer ([place]) 2016  . NICM (nonischemic cardiomyopathy) ([place])    a. 07/2014 Echo: EF 35-40%, mid-apicalanteroseptal DK, Gr 1 DD, mild-mod dil LA.  . Non-obstructive CAD    a. 07/2014 Abnl MV;  b. 08/2014 Cath: LM nl, LAD 30p, RI 40p, LCX nl, OM1 40, RCA dominant 30p, 70d-->Med Rx.  Marland Kitchen Poor balance   . Presence of permanent cardiac pacemaker   . Sleep apnea    a. cpap  . Vertigo   . WPW (Wolff-Parkinson-White syndrome)    a. S/P RFCA 1991.    Past Surgical History:  Procedure Laterality Date  . APPLICATION OF  WOUND VAC Left 06/07/2015   Procedure: APPLICATION OF WOUND VAC;  Surgeon: Robert Bellow, MD;  Location: [facility] ORS;  Service: General;  Laterality: Left;  left upper back  . BACK SURGERY     2011  . CARDIAC CATHETERIZATION  08/26/2014   Single vessel obstructive CAD  . CARPAL TUNNEL RELEASE  04-04-15   [facility]  . CATARACT EXTRACTION  07-31-11 and 09-18-11  . Catheter ablation  1991   for WPW  . cervical fusion    . CHOLECYSTECTOMY  09-07-14  . HAND SURGERY     right 1993; left 2005  . HERNIA REPAIR  1955  . INSERT / REPLACE / REMOVE PACEMAKER    . JOINT REPLACEMENT Left 2013   knee  . JOINT REPLACEMENT Right 2004   knee  . KNEE SURGERY     left knee 1991 and 1992; right knee 1995  . LEFT HEART CATHETERIZATION WITH CORONARY ANGIOGRAM N/A 08/26/2014   Procedure: LEFT HEART CATHETERIZATION WITH CORONARY ANGIOGRAM;  Surgeon: [name], MD;  Location: [facility] CATH LAB;  Service: Cardiovascular;  Laterality: N/A;  . LUMBAR LAMINECTOMY/DECOMPRESSION MICRODISCECTOMY N/A 06/07/2014   Procedure: LUMBAR FOUR TO FIVE LUMBAR LAMINECTOMY/DECOMPRESSION MICRODISCECTOMY 1 LEVEL;  Surgeon: [name], MD;  Location:  [place] NEURO ORS;  Service: Neurosurgery;  Laterality: N/A;  . LUNG BIOPSY Right 2016   Dr [name]  . PACEMAKER INSERTION     PPM-- St Jude 11/30/10 by [name]  . PROSTATE SURGERY     cancer--1998, prostatectomy  . REPLACEMENT TOTAL KNEE     2004  . ruptured disc     1962 and 1998  . TRIGGER FINGER RELEASE  01-24-15  . WOUND DEBRIDEMENT Left 06/07/2015   Procedure: DEBRIDEMENT WOUND;  Surgeon: Robert Bellow, MD;  Location: [facility] ORS;  Service: General;  Laterality: Left;  left upper back    There were no vitals filed for this visit.      Subjective Assessment - 04/18/17 0908    Subjective Patient states he is feeling better and that he has not had any further falls since 03/27/2017. Patient states he has been able to bend forward and pick up some things off the floor without dizziness. Patient  states he has not had any episodes of vertigo since his last PT visit. Patient states he has had a few times where he did feel wobbly. Patient reports that he has been cleared by his physician to resume physical therapy without restrictions as patient recently had procedure performed to remove cancerous cells from his scalp and face and had restrictions initially.    Pertinent History Patient states that "you got that stuff straightened out" in 2015 in reference to prior vestibular therapy for dizziness and vertigo. Patient states he had a fall on March 3rd when he got up at 3 am to let the dog out. Patient states he does not remember whether he was dizzy or not prior to or after the fall. Patient reports this is why he is returning to vestibular therapy. Patient states he has not been having vertigo or dizziness, but rather balance issues primarily at present. Patient states he has not been using any AD for mobility.    Patient Stated Goals Patient would like to be able to install an overhead lightbulb, lie in the prone position to shoot a rifle, be able to shave his chin and be able to get on the floor and look up underneath a table   Currently in Pain? No/denies      Neuromuscular Re-education:  Mini-Squats:  Patient performed 2 sets of 20 reps with 5 second holds mini-squats without UEs support standing in // bars with S and verbal cuing to not bend as deeply at the knees. Patient maintaining good, erect posture.   Note: After neuromuscular re-education, performed canalith repositioning maneuver.  Canalith Repositioning:  Patient with limited cervical extension ROM therefore performed testing on inclined mat table. On inverted mat table performed right Dix-Hallpike testing and it was positive with right upbeating torsional nystagmus of short duration. Patient without latency period. Performed right canalith repositioning maneuver (CRT). Repeated Right CRT for a total of 3 maneuvers with retesting  between maneuvers. Patient reported 7/10 vertigo with first maneuver, 4-5/10 with second maneuver and 0/10 with third maneuver.   Performed right Liberatory maneuver with second person assist however, no nystagmus observed and patient denied vertigo with maneuver. Patient with decreased neck ROM secondary to prior history of cervical fusion and therefore demonstrates decreased neck rotation and side-bending and therefore patient was unable to get into optimal position for Liberatory maneuver. Therefore, returned to trying the CRT maneuver as this seems the most effective.    On inverted mat table performed left Dix-Hallpike testing and it was negative with no  nystagmus observed and patient denied vertigo.       PT Education - 04/18/17 0911    Education provided Yes   Education Details Discussed plan of care; added mini-squats to HEP   Person(s) Educated Patient   Methods Explanation;Demonstration;Verbal cues   Comprehension Verbalized understanding;Returned demonstration             PT Long Term Goals - 04/18/17 1013      PT LONG TERM GOAL #1   Title Patient will demonstrate reduced falls risk as evidenced by Dynamic Gait Index (DGI) >19/24 by 04/29/2017.   Baseline scored 16/24 on 03/04/2017   Time 8   Period Weeks   Status On-going     PT LONG TERM GOAL #2   Title Patient will reduce perceived disability to low levels as indicated by <40 on Dizziness Handicap Inventory.   Baseline scored 46/100 on 03/04/2017   Time 8   Period Weeks   Status On-going     PT LONG TERM GOAL #3   Title Patient reports no vertigo with provoking motions or positions.   Time 8   Period Weeks   Status Partially Met     PT LONG TERM GOAL #4   Title Patient will report 50% or greater improvement in her symptoms of dizziness and imbalance with provoking motions or positions by 04/29/2017.   Time 8   Period Weeks   Status On-going     PT LONG TERM GOAL #5   Title Patient will have demonstrate  decreased falls risk as indicated by Activities Specific Balance Confidence Scale score of 80% or greater.   Baseline scored 62.5% on 03/04/2017   Status On-going     PT LONG TERM GOAL #6   Title Patient will be able to shave without symptoms of dizziness.    Time 8   Period Weeks   Status On-going               Plan - 04/18/17 0912    Clinical Impression Statement Patient suffered a fall on 03/27/2017 per patient report. Patient states he had been bending forward and "chasing a bug" and had a vertiginous episode with resultant fall striking his head. Patient was checked out at the ER. Patient returns to clinic this date reporting that he has not had any further episodes of vertigo but has had several episodes of imbalance/dizziness. Secondary to recent fall, retested left and right Dix-Hallpike tests and right side testing was negative. However, left side testing was positive and therefore performed canalith repositioning maneuvers and did attempt Liberatory maneuver as patient has been having persistent positive L sided testing. Will continue to treat patient for potential L posterior canal BPPV until clearance is achieved. In addition, patient would benefit from PT services to address balance and strength issues and to try to reduce patient's falls risk.    Rehab Potential Fair   Clinical Impairments Affecting Rehab Potential Positive Indicators: motivated, has improved with vestibular therapy in the past  Negative Indicators: chronicity, comorbidities   PT Frequency --  2 times a week for 2 weeks and then 1 time a week for 6 weeks   PT Duration 8 weeks   PT Treatment/Interventions Canalith Repostioning;Neuromuscular re-education;Balance training;Therapeutic exercise;Therapeutic activities;Functional mobility training;Stair training;Gait training;Patient/family education;Vestibular   PT Next Visit Plan Repeat L Dix-Hallpike testing and CRT if indicated until clearance obtained; then work  on balance activities; re-test/recert next visit or by 04/29/2017   PT Home Exercise Plan semi/full tandem stance 30s  holds without head turns, sit to stand 2 x 10 BID; mini squats 2 sets of 20 with 5 second holds   Consulted and Agree with Plan of Care Patient      Patient will benefit from skilled therapeutic intervention in order to improve the following deficits and impairments:  Decreased balance, Dizziness, Difficulty walking, Decreased strength  Visit Diagnosis: Dizziness and giddiness  Difficulty in walking, not elsewhere classified  Repeated falls     Problem List Patient Active Problem List   Diagnosis Date Noted  . Malignant neoplasm of right lung ([place]) 04/18/2016  . CAD (coronary artery disease) 04/01/2016  . Adenocarcinoma ([place]) 04/01/2016  . Skin cyst 12/26/2015  . GERD (gastroesophageal reflux disease) 06/16/2015  . Abscess of back 06/06/2015  . Vertigo 03/27/2015  . Carpal tunnel syndrome 10/28/2014  . Status post cholecystectomy 09/29/2014  . Disease of digestive tract 09/29/2014  . Nonischemic cardiomyopathy ([place]) 09/26/2014  . Cardiomyopathy ([place]) 09/26/2014  . Diabetes mellitus without complication ([place])   . Sleep apnea   . Spinal stenosis, lumbar region, with neurogenic claudication 06/07/2014  . Lumbar stenosis with neurogenic claudication 06/07/2014  . Abnormal gait 08/20/2012  . H/O total knee replacement 08/20/2012  . Arthritis of knee, degenerative 08/20/2012  . Pacemaker-St.Jude 08/03/2012  . Cardiac conduction disorder 06/19/2012  . Acid reflux 06/18/2012  . 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 Triggering of digit 03/25/2012  . Hyperlipidemia 12/23/2011  . Essential hypertension 03/23/2011  . Complete heart block ([place]) 03/23/2011  . Complete atrioventricular block ([place]) 03/23/2011   Lady Deutscher PT, DPT [id number] Lady Deutscher 04/18/2017, 10:35 AM  [place] MAIN [facility] SERVICES [address]  [place], [place], [address] Phone: [phone]   Fax:  [phone]  Name: Howard [name] MRN: [id number] Date of Birth: 06-30-1931

## 2017-04-21 DIAGNOSIS — J208 Acute bronchitis due to other specified organisms: Secondary | ICD-10-CM | POA: Diagnosis not present

## 2017-04-21 DIAGNOSIS — R05 Cough: Secondary | ICD-10-CM | POA: Diagnosis not present

## 2017-04-22 ENCOUNTER — Encounter: Payer: Self-pay | Admitting: Physical Therapy

## 2017-04-22 ENCOUNTER — Ambulatory Visit: Payer: Medicare Other | Attending: Family Medicine | Admitting: Physical Therapy

## 2017-04-22 DIAGNOSIS — R42 Dizziness and giddiness: Secondary | ICD-10-CM | POA: Insufficient documentation

## 2017-04-22 DIAGNOSIS — R262 Difficulty in walking, not elsewhere classified: Secondary | ICD-10-CM | POA: Diagnosis not present

## 2017-04-22 DIAGNOSIS — R296 Repeated falls: Secondary | ICD-10-CM | POA: Diagnosis not present

## 2017-04-22 NOTE — Therapy (Signed)
[place] [facility] MAIN [facility] SERVICES [address] [place], [place], [address] Phone: [phone]   Fax:  [phone]  Physical Therapy Treatment  Patient Details  Name: DEANDRA GADSON MRN: [id number] Date of Birth: 1931/02/11 Referring Provider: Dr. Pearlie Oyster  Encounter Date: 04/22/2017      PT End of Session - 04/22/17 0913    Visit Number 7   Number of Visits 15   Date for PT Re-Evaluation 06/17/17   Authorization Type Needs G codes   Authorization Time Period 7/10   PT Start Time 0905   PT Stop Time 0951   PT Time Calculation (min) 46 min   Equipment Utilized During Treatment Gait belt   Activity Tolerance Patient tolerated treatment well;Other (comment)  but did have episodes of vertigo requiring rest break   Behavior During Therapy [facility] for tasks assessed/performed      Past Medical History:  Diagnosis Date  . Arthritis   . Bell palsy   . Cancer [facility] [place] [facility])    prostate and skin  . Chronic combined systolic and diastolic CHF, NYHA class 1 (HCC)    a. 07/2014 Echo: EF 35-40%, Gr 1 DD.  Marland Kitchen Complete heart block (HCC)    a. 11/2010 s/p SJM 2210 [employer] DC PPM, ser# [id number].  . Depression   . Diabetes mellitus without complication (HCC)   . Fall 11-10-14  . GERD (gastroesophageal reflux disease)   . History of prostate cancer   . Hyperlipidemia   . Hypertension   . LBBB (left bundle branch block)   . Left-sided Bell's palsy   . Lung cancer (HCC) 2016  . NICM (nonischemic cardiomyopathy) (HCC)    a. 07/2014 Echo: EF 35-40%, mid-apicalanteroseptal DK, Gr 1 DD, mild-mod dil LA.  . Non-obstructive CAD    a. 07/2014 Abnl MV;  b. 08/2014 Cath: LM nl, LAD 30p, RI 40p, LCX nl, OM1 40, RCA dominant 30p, 70d-->Med Rx.  Marland Kitchen Poor balance   . Presence of permanent cardiac pacemaker   . Sleep apnea    a. cpap  . Vertigo   . WPW (Wolff-Parkinson-White syndrome)    a. S/P RFCA 1991.    Past Surgical History:  Procedure Laterality Date  . APPLICATION OF  WOUND VAC Left 06/07/2015   Procedure: APPLICATION OF WOUND VAC;  Surgeon: Earline Mayotte, MD;  Location: [facility] ORS;  Service: General;  Laterality: Left;  left upper back  . BACK SURGERY     2011  . CARDIAC CATHETERIZATION  08/26/2014   Single vessel obstructive CAD  . CARPAL TUNNEL RELEASE  04-04-15   [facility]  . CATARACT EXTRACTION  07-31-11 and 09-18-11  . Catheter ablation  1991   for WPW  . cervical fusion    . CHOLECYSTECTOMY  09-07-14  . HAND SURGERY     right 1993; left 2005  . HERNIA REPAIR  1955  . INSERT / REPLACE / REMOVE PACEMAKER    . JOINT REPLACEMENT Left 2013   knee  . JOINT REPLACEMENT Right 2004   knee  . KNEE SURGERY     left knee 1991 and 1992; right knee 1995  . LEFT HEART CATHETERIZATION WITH CORONARY ANGIOGRAM N/A 08/26/2014   Procedure: LEFT HEART CATHETERIZATION WITH CORONARY ANGIOGRAM;  Surgeon: Peter M Swaziland, MD;  Location: [facility] CATH LAB;  Service: Cardiovascular;  Laterality: N/A;  . LUMBAR LAMINECTOMY/DECOMPRESSION MICRODISCECTOMY N/A 06/07/2014   Procedure: LUMBAR FOUR TO FIVE LUMBAR LAMINECTOMY/DECOMPRESSION MICRODISCECTOMY 1 LEVEL;  Surgeon: Temple Pacini, MD;  Location:  [place] NEURO ORS;  Service: Neurosurgery;  Laterality: N/A;  . LUNG BIOPSY Right 2016   Dr [name]  . PACEMAKER INSERTION     PPM-- St Jude 11/30/10 by [name]  . PROSTATE SURGERY     cancer--1998, prostatectomy  . REPLACEMENT TOTAL KNEE     2004  . ruptured disc     1962 and 1998  . TRIGGER FINGER RELEASE  01-24-15  . WOUND DEBRIDEMENT Left 06/07/2015   Procedure: DEBRIDEMENT WOUND;  Surgeon: Robert Bellow, MD;  Location: [facility] ORS;  Service: General;  Laterality: Left;  left upper back    There were no vitals filed for this visit.      Subjective Assessment - 04/22/17 0911    Subjective Patient states that he had been coughing so he had an outpatient chest x-ray. Patient states that he was told he has pneumonia and started 7 days of antibiotics. Patient unable to recall what antibiotic he is on  and he is going to bring that information next session. Patient states he feels he is walking a little steadier with less veering. Patient reports no further episodes of spinning vertigo since last visit.    Pertinent History Patient states that "you got that stuff straightened out" in 2015 in reference to prior vestibular therapy for dizziness and vertigo. Patient states he had a fall on March 3rd when he got up at 3 am to let the dog out. Patient states he does not remember whether he was dizzy or not prior to or after the fall. Patient reports this is why he is returning to vestibular therapy. Patient states he has not been having vertigo or dizziness, but rather balance issues primarily at present. Patient states he has not been using any AD for mobility.    Patient Stated Goals Patient would like to be able to install an overhead lightbulb, lie in the prone position to shoot a rifle, be able to shave his chin and be able to get on the floor and look up underneath a table   Currently in Pain? No/denies      Neuromuscular Re-education: Performed DGI and repeated DHI and ABC scale.      [facility] PT Assessment - 04/22/17 0930      Dynamic Gait Index   Level Surface Normal   Change in Gait Speed Moderate Impairment   Gait with Horizontal Head Turns Mild Impairment   Gait with Vertical Head Turns Mild Impairment   Gait and Pivot Turn Normal   Step Over Obstacle Mild Impairment   Step Around Obstacles Normal   Steps Mild Impairment   Total Score 18       FUNCTIONAL OUTCOME MEASURES:  Results Comments  DHI 32/100 Low perception of handicap  ABC Scale 61.2% High falls risk; in need of intervention  DGI 18/24 Falls risk; in need of intervention   Discussed functional outcome measure test scores and compared to prior testing. Discussed and updated goals and discussed plan of care.   Note: After neuromuscular re-education and functional outcome measure testing, performed canalith  repositioning maneuvers.   Canalith Repositioning Maneuvers:  Patient with limited cervical extension ROM therefore performed testing on inclined mat table.  On inverted mat table performed right Dix-Hallpike testing and it was positive with right upbeating torsional nystagmus of short duration. Performed right canalith repositioning maneuver (CRT). Repeated R CRT for a total of 2 maneuvers with retesting between maneuvers. Patient reporting 3-4/10 vertigo on first CRT and 0/10 vertigo with second CRT.  Note patient denied vertigo with second right Dix-Hallpike test but observed several beats of nystagmus.         PT Education - 04/22/17 0913    Education provided Yes   Education Details reviewed plan of care and progress towards goals   Person(s) Educated Patient   Methods Explanation   Comprehension Verbalized understanding             PT Long Term Goals - 04/22/17 0913      PT LONG TERM GOAL #1   Title Patient will demonstrate reduced falls risk as evidenced by Dynamic Gait Index (DGI) >19/24 by 04/29/2017.   Baseline scored 16/24 on 03/04/2017; scored 18/24 on 04/22/2017   Time 8   Period Weeks   Status On-going     PT LONG TERM GOAL #2   Title Patient will reduce perceived disability to low levels as indicated by <40 on Dizziness Handicap Inventory.   Baseline scored 46/100 on 03/04/2017; scored 32/100 on 04/22/2017   Time 8   Period Weeks   Status On-going     PT LONG TERM GOAL #3   Title Patient reports no vertigo with provoking motions or positions.   Time 8   Period Weeks   Status Partially Met     PT LONG TERM GOAL #4   Title Patient will report 50% or greater improvement in his symptoms of dizziness and imbalance with provoking motions or positions by 04/29/2017.   Baseline patient unable to report % improvement on 04/22/2017   Time 8   Period Weeks   Status On-going     PT LONG TERM GOAL #5   Title Patient will have demonstrate decreased falls risk as indicated  by Activities Specific Balance Confidence Scale score of 80% or greater.   Baseline scored 62.5% on 03/04/2017; scored 61.25% on 04/22/2017   Status On-going     PT LONG TERM GOAL #6   Title Patient will be able to shave without symptoms of dizziness.    Time 8   Period Weeks   Status Achieved               Plan - 04/22/17 0914    Clinical Impression Statement Patient with persistent positive Right Dix-Hallpike test but does report decrease in intensity of vertigo and noted decreased length of time and vigorousness of vertigo with testing. Retested functional outcome measures and updated goals this date. Patient has met 1 goal, partially met 1 goal and 4 goals are on-going as set on plan of care. Patient reports that he feels steadier with walking and moving now. Patient improved from 16/24 to 18/24 on the DGI and improved from moderate to low perception of handicap on the Dizziness Handicap Inventory. Patient continues to make progress but would benefit from further PT services to perform canalith repositioning manuevers for right ear until clearance is obtained, to further address goals, to address functional deficits and to try to reduce patients falls risk.    Rehab Potential Fair   Clinical Impairments Affecting Rehab Potential Positive Indicators: motivated, has improved with vestibular therapy in the past  Negative Indicators: chronicity, comorbidities   PT Frequency --  2 times a week for 2 weeks and then 1 time a week for 6 weeks   PT Duration 8 weeks   PT Treatment/Interventions Canalith Repostioning;Neuromuscular re-education;Balance training;Therapeutic exercise;Therapeutic activities;Functional mobility training;Stair training;Gait training;Patient/family education;Vestibular   PT Next Visit Plan Repeat L Dix-Hallpike testing and CRT if indicated until clearance obtained; then work  on balance activities; re-test/recert next visit or by 04/29/2017   PT Home Exercise Plan semi/full  tandem stance 30s holds without head turns, sit to stand 2 x 10 BID; mini squats 2 sets of 20 with 5 second holds   Consulted and Agree with Plan of Care Patient      Patient will benefit from skilled therapeutic intervention in order to improve the following deficits and impairments:  Decreased balance, Dizziness, Difficulty walking, Decreased strength  Visit Diagnosis: Dizziness and giddiness  Difficulty in walking, not elsewhere classified  Repeated falls     Problem List Patient Active Problem List   Diagnosis Date Noted  . Malignant neoplasm of right lung ([place]) 04/18/2016  . CAD (coronary artery disease) 04/01/2016  . Adenocarcinoma ([place]) 04/01/2016  . Skin cyst 12/26/2015  . GERD (gastroesophageal reflux disease) 06/16/2015  . Abscess of back 06/06/2015  . Vertigo 03/27/2015  . Carpal tunnel syndrome 10/28/2014  . Status post cholecystectomy 09/29/2014  . Disease of digestive tract 09/29/2014  . Nonischemic cardiomyopathy ([place]) 09/26/2014  . Cardiomyopathy ([place]) 09/26/2014  . Diabetes mellitus without complication ([place])   . Sleep apnea   . Spinal stenosis, lumbar region, with neurogenic claudication 06/07/2014  . Lumbar stenosis with neurogenic claudication 06/07/2014  . Abnormal gait 08/20/2012  . H/O total knee replacement 08/20/2012  . Arthritis of knee, degenerative 08/20/2012  . Pacemaker-St.Jude 08/03/2012  . Cardiac conduction disorder 06/19/2012  . Acid reflux 06/18/2012  . 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 Triggering of digit 03/25/2012  . Hyperlipidemia 12/23/2011  . Essential hypertension 03/23/2011  . Complete heart block ([place]) 03/23/2011  . Complete atrioventricular block ([place]) 03/23/2011    Murphy,Dorriea 04/22/2017, 12:02 PM  [place] MAIN [facility] SERVICES [address] [place], [place], [address] Phone: [phone]   Fax:  [phone]  Name: SOMA LIZAK MRN: [id number] Date of Birth:  [birth year]

## 2017-04-23 ENCOUNTER — Other Ambulatory Visit: Payer: Self-pay | Admitting: *Deleted

## 2017-04-23 ENCOUNTER — Encounter: Payer: Self-pay | Admitting: Radiation Oncology

## 2017-04-23 ENCOUNTER — Ambulatory Visit
Admission: RE | Admit: 2017-04-23 | Discharge: 2017-04-23 | Disposition: A | Discharge status: Home / Self Care | Payer: Medicare Other | Source: Ambulatory Visit | Attending: Radiation Oncology | Admitting: Radiation Oncology

## 2017-04-23 VITALS — BP 159/79 | HR 68 | Resp 18 | Wt 190.5 lb

## 2017-04-23 DIAGNOSIS — C3411 Malignant neoplasm of upper lobe, right bronchus or lung: Secondary | ICD-10-CM

## 2017-04-23 DIAGNOSIS — J189 Pneumonia, unspecified organism: Secondary | ICD-10-CM | POA: Insufficient documentation

## 2017-04-23 DIAGNOSIS — Z923 Personal history of irradiation: Secondary | ICD-10-CM | POA: Insufficient documentation

## 2017-04-23 DIAGNOSIS — F17210 Nicotine dependence, cigarettes, uncomplicated: Secondary | ICD-10-CM | POA: Insufficient documentation

## 2017-04-23 NOTE — Progress Notes (Signed)
Radiation Oncology Follow up Note  Name: Howard [name]   Date:   04/23/2017 MRN:  [id number] DOB: Nov 07, 1931    This 83 y.o. male presents to the clinic today for 40 month follow-up status post SB RT for stage I adenocarcinoma of the right lung.  REFERRING PROVIDER: Margo Common, PA  HPI: Patient is an 83 year old male now out 14 months having completed SB RT to his right lung for stage I adenocarcinoma. Seen today in routine follow-up he is doing well. Specifically denies cough hemoptysis or chest tightness. He has had a little pneumonia which is clearing. Recently had a CT scan. Of his chest showing post radiation fibrosis in the right upper lobe otherwise stable chest.  COMPLICATIONS OF TREATMENT: none  FOLLOW UP COMPLIANCE: keeps appointments   PHYSICAL EXAM:  BP (!) 159/79   Pulse 68   Resp 18   Wt 190 lb 7.6 oz (86.4 kg)   BMI 28.13 kg/m  Well-developed well-nourished patient in NAD. HEENT reveals PERLA, EOMI, discs not visualized.  Oral cavity is clear. No oral mucosal lesions are identified. Neck is clear without evidence of cervical or supraclavicular adenopathy. Lungs are clear to A&P. Cardiac examination is essentially unremarkable with regular rate and rhythm without murmur rub or thrill. Abdomen is benign with no organomegaly or masses noted. Motor sensory and DTR levels are equal and symmetric in the upper and lower extremities. Cranial nerves II through XII are grossly intact. Proprioception is intact. No peripheral adenopathy or edema is identified. No motor or sensory levels are noted. Crude visual fields are within normal range.  RADIOLOGY RESULTS: CT scan reviewed and compatible with the above-stated findings  PLAN: Present time he continues to do well with excellent response to SB RT. I'm please was overall progress. I think we can go to once your follow-up appointments and obtain a CT scan of his chest 1 week prior. Patient knows to call at anytime with any  concerns.  I would like to take this opportunity to thank you for allowing me to participate in the care of your patient.Armstead Peaks., MD

## 2017-04-28 ENCOUNTER — Ambulatory Visit: Payer: Medicare Other

## 2017-04-28 VITALS — BP 150/81 | HR 70

## 2017-04-28 DIAGNOSIS — R262 Difficulty in walking, not elsewhere classified: Secondary | ICD-10-CM

## 2017-04-28 DIAGNOSIS — R42 Dizziness and giddiness: Secondary | ICD-10-CM

## 2017-04-28 DIAGNOSIS — R296 Repeated falls: Secondary | ICD-10-CM | POA: Diagnosis not present

## 2017-04-28 NOTE — Therapy (Signed)
[place] MAIN [facility] SERVICES [address] [place], [place], [address] Phone: [phone]   Fax:  [phone]  Physical Therapy Treatment  Patient Details  Name: Howard [name] MRN: [id number] Date of Birth: 04-14-1931 Referring Provider: Dr. Pati Gallo  Encounter Date: 04/28/2017      PT End of Session - 04/28/17 0839    Visit Number 8   Number of Visits 15   Date for PT Re-Evaluation 06/17/17   Authorization Type Needs G codes   Authorization Time Period 8/10   PT Start Time 0842   PT Stop Time 0920   PT Time Calculation (min) 38 min   Equipment Utilized During Treatment Gait belt   Activity Tolerance Patient tolerated treatment well;Other (comment)  but did have episodes of vertigo requiring rest break   Behavior During Therapy [facility] for tasks assessed/performed      Past Medical History:  Diagnosis Date  . Arthritis   . Bell palsy   . Cancer [facility])    prostate and skin  . Chronic combined systolic and diastolic CHF, NYHA class 1 ([place])    a. 07/2014 Echo: EF 35-40%, Gr 1 DD.  Marland Kitchen Complete heart block ([place])    a. 11/2010 s/p SJM 2210 Accent DC PPM, ser# [id number].  . Depression   . Diabetes mellitus without complication ([place])   . Fall 11-10-14  . GERD (gastroesophageal reflux disease)   . History of prostate cancer   . Hyperlipidemia   . Hypertension   . LBBB (left bundle branch block)   . Left-sided Bell's palsy   . Lung cancer ([place]) 2016  . NICM (nonischemic cardiomyopathy) ([place])    a. 07/2014 Echo: EF 35-40%, mid-apicalanteroseptal DK, Gr 1 DD, mild-mod dil LA.  . Non-obstructive CAD    a. 07/2014 Abnl MV;  b. 08/2014 Cath: LM nl, LAD 30p, RI 40p, LCX nl, OM1 40, RCA dominant 30p, 70d-->Med Rx.  Marland Kitchen Poor balance   . Presence of permanent cardiac pacemaker   . Sleep apnea    a. cpap  . Vertigo   . WPW (Wolff-Parkinson-White syndrome)    a. S/P RFCA 1991.    Past Surgical History:  Procedure Laterality Date  . APPLICATION OF  WOUND VAC Left 06/07/2015   Procedure: APPLICATION OF WOUND VAC;  Surgeon: Robert Bellow, MD;  Location: [facility] ORS;  Service: General;  Laterality: Left;  left upper back  . BACK SURGERY     2011  . CARDIAC CATHETERIZATION  08/26/2014   Single vessel obstructive CAD  . CARPAL TUNNEL RELEASE  04-04-15   [facility]  . CATARACT EXTRACTION  07-31-11 and 09-18-11  . Catheter ablation  1991   for WPW  . cervical fusion    . CHOLECYSTECTOMY  09-07-14  . HAND SURGERY     right 1993; left 2005  . HERNIA REPAIR  1955  . INSERT / REPLACE / REMOVE PACEMAKER    . JOINT REPLACEMENT Left 2013   knee  . JOINT REPLACEMENT Right 2004   knee  . KNEE SURGERY     left knee 1991 and 1992; right knee 1995  . LEFT HEART CATHETERIZATION WITH CORONARY ANGIOGRAM N/A 08/26/2014   Procedure: LEFT HEART CATHETERIZATION WITH CORONARY ANGIOGRAM;  Surgeon: [name], MD;  Location: [facility] C[facility] LAB;  Service: Cardiovascular;  Laterality: N/A;  . LUMBAR LAMINECTOMY/DECOMPRESSION MICRODISCECTOMY N/A 06/07/2014   Procedure: LUMBAR FOUR TO FIVE LUMBAR LAMINECTOMY/DECOMPRESSION MICRODISCECTOMY 1 LEVEL;  Surgeon: [name], MD;  Location:  [place] NEURO ORS;  Service: Neurosurgery;  Laterality: N/A;  . LUNG BIOPSY Right 2016   Dr [name]  . PACEMAKER INSERTION     PPM-- St Jude 11/30/10 by [name]  . PROSTATE SURGERY     cancer--1998, prostatectomy  . REPLACEMENT TOTAL KNEE     2004  . ruptured disc     1962 and 1998  . TRIGGER FINGER RELEASE  01-24-15  . WOUND DEBRIDEMENT Left 06/07/2015   Procedure: DEBRIDEMENT WOUND;  Surgeon: Robert Bellow, MD;  Location: [facility] ORS;  Service: General;  Laterality: Left;  left upper back    Vitals:   04/28/17 0846  BP: (!) 150/81  Pulse: 70  SpO2: 96%        Subjective Assessment - 04/28/17 0838    Subjective Pt reports that he has been feeling "woozy" this morning. No further falls since last therapy session. No specific questions or concerns at this time.    Pertinent History Patient  states that "you got that stuff straightened out" in 2015 in reference to prior vestibular therapy for dizziness and vertigo. Patient states he had a fall on March 3rd when he got up at 3 am to let the dog out. Patient states he does not remember whether he was dizzy or not prior to or after the fall. Patient reports this is why he is returning to vestibular therapy. Patient states he has not been having vertigo or dizziness, but rather balance issues primarily at present. Patient states he has not been using any AD for mobility.    Patient Stated Goals Patient would like to be able to install an overhead lightbulb, lie in the prone position to shoot a rifle, be able to shave his chin and be able to get on the floor and look up underneath a table   Currently in Pain? No/denies         Neuromuscular Re-education:  Sit to stand without UE support standing on Airex in // bars 2 x 10; Mini Squats without UEs support standing on rocker board in R/L orientation in // bars 2 x 10; Semitandem alternating forward LE, static balance x 30s each, horizontal head turns x 30s; Airex feet together balance x 30s, horizontal head turns x 30s;  HEP review with patient;  Canalith Repositioning Maneuvers:  Patient with limited cervical extension ROM therefore performed testing on inclined mat table.  On inverted mat table performed right Dix-Hallpike testing and it was mildly positive with 5-8 very faint beats of right upbeating torsional nystagmus. Pt denies vertigo. Performed left Dix-Hallpike testing and it was positive with moderate to severely vigorous L torsional upbeating nystagmus of very short duration. Pt reports 3/10 vertigo. Performed left canalith repositioning maneuver (CRT) with 2 minute holds in each position. Repeated L CRT for a total of 2 maneuvers with retesting between maneuvers. Patient reporting 3/10 vertigo on first CRT and 0/10 vertigo with second CRT and no nystagmus observed.                       PT Education - 04/28/17 0839    Education provided Yes   Education Details Plan of care   Person(s) Educated Patient   Methods Explanation   Comprehension Verbalized understanding             PT Long Term Goals - 04/22/17 0913      PT LONG TERM GOAL #1   Title Patient will demonstrate reduced falls risk as evidenced by  Dynamic Gait Index (DGI) >19/24 by 04/29/2017.   Baseline scored 16/24 on 03/04/2017; scored 18/24 on 04/22/2017   Time 8   Period Weeks   Status On-going     PT LONG TERM GOAL #2   Title Patient will reduce perceived disability to low levels as indicated by <40 on Dizziness Handicap Inventory.   Baseline scored 46/100 on 03/04/2017; scored 32/100 on 04/22/2017   Time 8   Period Weeks   Status On-going     PT LONG TERM GOAL #3   Title Patient reports no vertigo with provoking motions or positions.   Time 8   Period Weeks   Status Partially Met     PT LONG TERM GOAL #4   Title Patient will report 50% or greater improvement in his symptoms of dizziness and imbalance with provoking motions or positions by 04/29/2017.   Baseline patient unable to report % improvement on 04/22/2017   Time 8   Period Weeks   Status On-going     PT LONG TERM GOAL #5   Title Patient will have demonstrate decreased falls risk as indicated by Activities Specific Balance Confidence Scale score of 80% or greater.   Baseline scored 62.5% on 03/04/2017; scored 61.25% on 04/22/2017   Status On-going     PT LONG TERM GOAL #6   Title Patient will be able to shave without symptoms of dizziness.    Time 8   Period Weeks   Status Achieved               Plan - 04/28/17 3536    Clinical Impression Statement Pt with very mildly positive R Dix-Hallpike test on this date for a few beats of nystagmus but no symptoms reported. He does have a significantly positive L Dix-Hallpike test for both vertigo and vigorous nystagmus. Pt was treated with 2 rounds of  CRT with complete resolution of nystagmus and symptoms following first treatment. Following CRT worked on some balance activities with patient. Pt encouraged to continue HEP and follow-up as scheduled. Will continue performing Dix-Hallpike bilaterally until BPPV is completely resolved and then proceed with further balance/strength training as indicated.    Rehab Potential Fair   Clinical Impairments Affecting Rehab Potential Positive Indicators: motivated, has improved with vestibular therapy in the past  Negative Indicators: chronicity, comorbidities   PT Frequency --  2 times a week for 2 weeks and then 1 time a week for 6 weeks   PT Duration 8 weeks   PT Treatment/Interventions Canalith Repostioning;Neuromuscular re-education;Balance training;Therapeutic exercise;Therapeutic activities;Functional mobility training;Stair training;Gait training;Patient/family education;Vestibular   PT Next Visit Plan Repeat Dix-Hallpike testing and CRT if indicated until clearance obtained; then work on balance activities;   PT Home Exercise Plan semi/full tandem stance 30s holds without head turns, sit to stand 2 x 10 BID; mini squats 2 sets of 20 with 5 second holds   Consulted and Agree with Plan of Care Patient      Patient will benefit from skilled therapeutic intervention in order to improve the following deficits and impairments:  Decreased balance, Dizziness, Difficulty walking, Decreased strength  Visit Diagnosis: Dizziness and giddiness  Difficulty in walking, not elsewhere classified     Problem List Patient Active Problem List   Diagnosis Date Noted  . Malignant neoplasm of right lung ([place]) 04/18/2016  . CAD (coronary artery disease) 04/01/2016  . Adenocarcinoma ([place]) 04/01/2016  . Skin cyst 12/26/2015  . GERD (gastroesophageal reflux disease) 06/16/2015  . Abscess of back 06/06/2015  .  Vertigo 03/27/2015  . Carpal tunnel syndrome 10/28/2014  . Status post cholecystectomy 09/29/2014  .  Disease of digestive tract 09/29/2014  . Nonischemic cardiomyopathy ([place]) 09/26/2014  . Cardiomyopathy ([place]) 09/26/2014  . Diabetes mellitus without complication ([place])   . Sleep apnea   . Spinal stenosis, lumbar region, with neurogenic claudication 06/07/2014  . Lumbar stenosis with neurogenic claudication 06/07/2014  . Abnormal gait 08/20/2012  . H/O total knee replacement 08/20/2012  . Arthritis of knee, degenerative 08/20/2012  . Pacemaker-St.Jude 08/03/2012  . Cardiac conduction disorder 06/19/2012  . Acid reflux 06/18/2012  . 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 Triggering of digit 03/25/2012  . Hyperlipidemia 12/23/2011  . Essential hypertension 03/23/2011  . Complete heart block ([place]) 03/23/2011  . Complete atrioventricular block ([place]) 03/23/2011   Phillips Grout PT, DPT   Huprich,Jason 04/28/2017, 9:29 AM  [place] MAIN [facility] SERVICES [address] [place], [place], [address] Phone: [phone]   Fax:  [phone]  Name: Howard [name] MRN: [id number] Date of Birth: 11-Jan-1931

## 2017-04-29 ENCOUNTER — Encounter: Payer: Medicare Other | Admitting: Physical Therapy

## 2017-05-06 ENCOUNTER — Ambulatory Visit: Payer: Medicare Other | Admitting: Physical Therapy

## 2017-05-06 ENCOUNTER — Encounter: Payer: Self-pay | Admitting: Physical Therapy

## 2017-05-06 DIAGNOSIS — R42 Dizziness and giddiness: Secondary | ICD-10-CM | POA: Diagnosis not present

## 2017-05-06 DIAGNOSIS — R296 Repeated falls: Secondary | ICD-10-CM | POA: Diagnosis not present

## 2017-05-06 DIAGNOSIS — R262 Difficulty in walking, not elsewhere classified: Secondary | ICD-10-CM

## 2017-05-06 NOTE — Therapy (Addendum)
[place] MAIN [facility] SERVICES [address] [place], [place], [address] Phone: [phone]   Fax:  [phone]  Physical Therapy Treatment  Patient Details  Name: Howard [name] MRN: [id number] Date of Birth: [birth year] Referring Provider: Dr. Pati Gallo  Encounter Date: 05/06/2017      PT End of Session - 05/06/17 0904    Visit Number 9   Number of Visits 15   Date for PT Re-Evaluation 06/17/17   Authorization Type Needs G codes   Authorization Time Period 9/10   PT Start Time 0900   PT Stop Time 0936   PT Time Calculation (min) 36 min   Equipment Utilized During Treatment Gait belt   Activity Tolerance Patient tolerated treatment well;Other (comment)  but did have episodes of vertigo requiring rest break   Behavior During Therapy [facility] for tasks assessed/performed      Past Medical History:  Diagnosis Date  . Arthritis   . Bell palsy   . Cancer [facility])    prostate and skin  . Chronic combined systolic and diastolic CHF, NYHA class 1 ([place])    a. 07/2014 Echo: EF 35-40%, Gr 1 DD.  Marland Kitchen Complete heart block ([place])    a. 11/2010 s/p SJM 2210 Accent DC PPM, ser# [id number].  . Depression   . Diabetes mellitus without complication ([place])   . Fall 11-10-14  . GERD (gastroesophageal reflux disease)   . History of prostate cancer   . Hyperlipidemia   . Hypertension   . LBBB (left bundle branch block)   . Left-sided Bell's palsy   . Lung cancer ([place]) 2016  . NICM (nonischemic cardiomyopathy) ([place])    a. 07/2014 Echo: EF 35-40%, mid-apicalanteroseptal DK, Gr 1 DD, mild-mod dil LA.  . Non-obstructive CAD    a. 07/2014 Abnl MV;  b. 08/2014 Cath: LM nl, LAD 30p, RI 40p, LCX nl, OM1 40, RCA dominant 30p, 70d-->Med Rx.  Marland Kitchen Poor balance   . Presence of permanent cardiac pacemaker   . Sleep apnea    a. cpap  . Vertigo   . WPW (Wolff-Parkinson-White syndrome)    a. S/P RFCA 1991.    Past Surgical History:  Procedure Laterality Date  . APPLICATION OF  WOUND VAC Left 06/07/2015   Procedure: APPLICATION OF WOUND VAC;  Surgeon: Robert Bellow, MD;  Location: [facility] ORS;  Service: General;  Laterality: Left;  left upper back  . BACK SURGERY     2011  . CARDIAC CATHETERIZATION  08/26/2014   Single vessel obstructive CAD  . CARPAL TUNNEL RELEASE  04-04-15   [facility]  . CATARACT EXTRACTION  07-31-11 and 09-18-11  . Catheter ablation  1991   for WPW  . cervical fusion    . CHOLECYSTECTOMY  09-07-14  . HAND SURGERY     right 1993; left 2005  . HERNIA REPAIR  1955  . INSERT / REPLACE / REMOVE PACEMAKER    . JOINT REPLACEMENT Left 2013   knee  . JOINT REPLACEMENT Right 2004   knee  . KNEE SURGERY     left knee 1991 and 1992; right knee 1995  . LEFT HEART CATHETERIZATION WITH CORONARY ANGIOGRAM N/A 08/26/2014   Procedure: LEFT HEART CATHETERIZATION WITH CORONARY ANGIOGRAM;  Surgeon: [name], MD;  Location: [facility] CATH LAB;  Service: Cardiovascular;  Laterality: N/A;  . LUMBAR LAMINECTOMY/DECOMPRESSION MICRODISCECTOMY N/A 06/07/2014   Procedure: LUMBAR FOUR TO FIVE LUMBAR LAMINECTOMY/DECOMPRESSION MICRODISCECTOMY 1 LEVEL;  Surgeon: [name], MD;  Location:  [place] NEURO ORS;  Service: Neurosurgery;  Laterality: N/A;  . LUNG BIOPSY Right 2016   Dr [name]  . PACEMAKER INSERTION     PPM-- St Jude 11/30/10 by [name]  . PROSTATE SURGERY     cancer--1998, prostatectomy  . REPLACEMENT TOTAL KNEE     2004  . ruptured disc     1962 and 1998  . TRIGGER FINGER RELEASE  01-24-15  . WOUND DEBRIDEMENT Left 06/07/2015   Procedure: DEBRIDEMENT WOUND;  Surgeon: Robert Bellow, MD;  Location: [facility] ORS;  Service: General;  Laterality: Left;  left upper back    There were no vitals filed for this visit.      Subjective Assessment - 05/06/17 0902    Subjective Patient states that he is feeling a whole lot better. Patient states that he does not feel like he is veering when he is walking now. Patient states he has not had any further episodes of spinning since last  visit and patient states that he has been bending over to pick things off the floor to test himself and has not been able to bring on his dizziness.    Pertinent History Patient states that "you got that stuff straightened out" in 2015 in reference to prior vestibular therapy for dizziness and vertigo. Patient states he had a fall on March 3rd when he got up at 3 am to let the dog out. Patient states he does not remember whether he was dizzy or not prior to or after the fall. Patient reports this is why he is returning to vestibular therapy. Patient states he has not been having vertigo or dizziness, but rather balance issues primarily at present. Patient states he has not been using any AD for mobility.    Patient Stated Goals Patient would like to be able to install an overhead lightbulb, lie in the prone position to shoot a rifle, be able to shave his chin and be able to get on the floor and look up underneath a table   Currently in Pain? No/denies      Neuromuscular Re-education:  On Airex balance beam: Patient performed sidestepping left/right without head turns and then with horizontal head turns 6 to 8 reps times 5' each. Performed on Airex balance beam tandem walking 5' times multiple reps. Patient required CGA and patient would touch // bars intermittently for a second or two in order to regain balance.   2" X 4" board:   On 2" X 4" board worked on sidestepping left/right with and without horizontal head turns and forward tandem walking 8' times multiple reps of each type. Patient had challenged with maintaining balance with head horizontal head turns while sidestepping. Patient required CGA with activities on 2 X 4" board.   1/2 Foam Roll:  On 1/2 foam roll with flat side down worked on static holds with and without vertical head turns for 3-4 minutes with SBA in // bars. Patient was able to perform without UEs support and noted good ankle strategy usage to maintain balance.  Patient  denies dizziness throughout these above activities.   Note: After neuromuscular re-education, performed Canalith repositioning maneuver.  Canalith Repositioning:  Patient with limited cervical extension ROM therefore performed testing on inclined mat table.  On inverted mat table performed right Dix-Hallpike testing and it was negative with no nystagmus observed and patient denied vertigo. Performed left Dix-Hallpike test and it was positive with left upbeating torsional nystagmus of short duration noted but patient denies vertigo. Patient  with a few second latency period and then observed 3 beats of nystagmus. Performed left canalith repositioning maneuver (CRT). Repeated left Dix-Hallpike test and it was negative with no nystagmus observed and patient denied dizziness.          PT Education - 05/06/17 0904    Education provided Yes   Education Details discussed plan of care   Person(s) Educated Patient   Methods Explanation   Comprehension Verbalized understanding             PT Long Term Goals - 05/06/17 0946      PT LONG TERM GOAL #1   Title Patient will demonstrate reduced falls risk as evidenced by Dynamic Gait Index (DGI) >19/24 by 06/17/2017.   Baseline scored 16/24 on 03/04/2017; scored 18/24 on 04/22/2017   Time 8   Period Weeks   Status On-going     PT LONG TERM GOAL #2   Title Patient will reduce perceived disability to low levels as indicated by <40 on Dizziness Handicap Inventory.   Baseline scored 46/100 on 03/04/2017; scored 32/100 on 04/22/2017   Time 8   Period Weeks   Status On-going     PT LONG TERM GOAL #3   Title Patient reports no vertigo with provoking motions or positions.   Time 8   Period Weeks   Status Partially Met     PT LONG TERM GOAL #4   Title Patient will report 50% or greater improvement in his symptoms of dizziness and imbalance with provoking motions or positions by 06/17/2017.   Baseline patient unable to report % improvement on  04/22/2017   Time 8   Period Weeks   Status On-going     PT LONG TERM GOAL #5   Title Patient will have demonstrate decreased falls risk as indicated by Activities Specific Balance Confidence Scale score of 80% or greater.   Baseline scored 62.5% on 03/04/2017; scored 61.25% on 04/22/2017   Status On-going     PT LONG TERM GOAL #6   Title Patient will be able to shave without symptoms of dizziness.    Time 8   Period Weeks   Status Achieved               Plan - 05/06/17 0905    Clinical Impression Statement Patient with mildly positive left Dix-hallpike test and negative right Dix-Hallpike test this date. Patient with a few beats of nystagmus noted with left side testing but denies vertigo. Performed left canalith repositioning maneuver and then repeated left Dix-hallpike test which was then negative with no nystagmus observed and patient denied vertigo. Patient reports that he has felt significantly better since last treatment visit stating that he has not had any episodes of vertigo and that he feels his walking is significantly improved. Will plan on retesting functional outcome measures next visit.    Rehab Potential Fair   Clinical Impairments Affecting Rehab Potential Positive Indicators: motivated, has improved with vestibular therapy in the past  Negative Indicators: chronicity, comorbidities   PT Frequency --  2 times a week for 2 weeks and then 1 time a week for 6 weeks   PT Duration 8 weeks   PT Treatment/Interventions Canalith Repostioning;Neuromuscular re-education;Balance training;Therapeutic exercise;Therapeutic activities;Functional mobility training;Stair training;Gait training;Patient/family education;Vestibular   PT Next Visit Plan Repeat Dix-Hallpike testing and CRT if indicated until clearance obtained; then work on balance activities;   PT Home Exercise Plan semi/full tandem stance 30s holds without head turns, sit to stand 2 x  10 BID; mini squats 2 sets of 20 with  5 second holds   Consulted and Agree with Plan of Care Patient      Patient will benefit from skilled therapeutic intervention in order to improve the following deficits and impairments:  Decreased balance, Dizziness, Difficulty walking, Decreased strength  Visit Diagnosis: Dizziness and giddiness  Difficulty in walking, not elsewhere classified  Repeated falls     Problem List Patient Active Problem List   Diagnosis Date Noted  . Malignant neoplasm of right lung ([place]) 04/18/2016  . CAD (coronary artery disease) 04/01/2016  . Adenocarcinoma ([place]) 04/01/2016  . Skin cyst 12/26/2015  . GERD (gastroesophageal reflux disease) 06/16/2015  . Abscess of back 06/06/2015  . Vertigo 03/27/2015  . Carpal tunnel syndrome 10/28/2014  . Status post cholecystectomy 09/29/2014  . Disease of digestive tract 09/29/2014  . Nonischemic cardiomyopathy ([place]) 09/26/2014  . Cardiomyopathy ([place]) 09/26/2014  . Diabetes mellitus without complication ([place])   . Sleep apnea   . Spinal stenosis, lumbar region, with neurogenic claudication 06/07/2014  . Lumbar stenosis with neurogenic claudication 06/07/2014  . Abnormal gait 08/20/2012  . H/O total knee replacement 08/20/2012  . Arthritis of knee, degenerative 08/20/2012  . Pacemaker-St.Jude 08/03/2012  . Cardiac conduction disorder 06/19/2012  . Acid reflux 06/18/2012  . 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 Triggering of digit 03/25/2012  . Hyperlipidemia 12/23/2011  . Essential hypertension 03/23/2011  . Complete heart block ([place]) 03/23/2011  . Complete atrioventricular block ([place]) 03/23/2011    , 05/06/2017, 10:01 AM  [place] MAIN [facility] SERVICES [address] [place], [place], [address] Phone: [phone]   Fax:  [phone]  Name: Howard [name] MRN: [id number] Date of Birth: 1931/10/25

## 2017-05-13 ENCOUNTER — Ambulatory Visit: Payer: Medicare Other

## 2017-05-13 DIAGNOSIS — R42 Dizziness and giddiness: Secondary | ICD-10-CM

## 2017-05-13 DIAGNOSIS — R262 Difficulty in walking, not elsewhere classified: Secondary | ICD-10-CM | POA: Diagnosis not present

## 2017-05-13 DIAGNOSIS — R296 Repeated falls: Secondary | ICD-10-CM | POA: Diagnosis not present

## 2017-05-13 NOTE — Therapy (Signed)
[place] MAIN [facility] SERVICES [address] [place], [place], [address] Phone: [phone]   Fax:  [phone]  Physical Therapy Treatment/Discharge  Patient Details  Name: Howard [name] MRN: [id number] Date of Birth: October 01, 1931 Referring Provider: Dr. Pati Gallo  Encounter Date: 05/13/2017      PT End of Session - 05/13/17 0859    Visit Number 10   Number of Visits 15   Date for PT Re-Evaluation 06/17/17   Authorization Type Needs G codes   Authorization Time Period 10/10   PT Start Time 0905   PT Stop Time 0945   PT Time Calculation (min) 40 min   Equipment Utilized During Treatment Gait belt   Activity Tolerance Patient tolerated treatment well   Behavior During Therapy [facility] for tasks assessed/performed      Past Medical History:  Diagnosis Date  . Arthritis   . Bell palsy   . Cancer [facility])    prostate and skin  . Chronic combined systolic and diastolic CHF, NYHA class 1 ([place])    a. 07/2014 Echo: EF 35-40%, Gr 1 DD.  Marland Kitchen Complete heart block ([place])    a. 11/2010 s/p SJM 2210 Accent DC PPM, ser# [id number].  . Depression   . Diabetes mellitus without complication ([place])   . Fall 11-10-14  . GERD (gastroesophageal reflux disease)   . History of prostate cancer   . Hyperlipidemia   . Hypertension   . LBBB (left bundle branch block)   . Left-sided Bell's palsy   . Lung cancer ([place]) 2016  . NICM (nonischemic cardiomyopathy) ([place])    a. 07/2014 Echo: EF 35-40%, mid-apicalanteroseptal DK, Gr 1 DD, mild-mod dil LA.  . Non-obstructive CAD    a. 07/2014 Abnl MV;  b. 08/2014 Cath: LM nl, LAD 30p, RI 40p, LCX nl, OM1 40, RCA dominant 30p, 70d-->Med Rx.  Marland Kitchen Poor balance   . Presence of permanent cardiac pacemaker   . Sleep apnea    a. cpap  . Vertigo   . WPW (Wolff-Parkinson-White syndrome)    a. S/P RFCA 1991.    Past Surgical History:  Procedure Laterality Date  . APPLICATION OF WOUND VAC Left 06/07/2015   Procedure: APPLICATION OF WOUND  VAC;  Surgeon: Robert Bellow, MD;  Location: [facility] ORS;  Service: General;  Laterality: Left;  left upper back  . BACK SURGERY     2011  . CARDIAC CATHETERIZATION  08/26/2014   Single vessel obstructive CAD  . CARPAL TUNNEL RELEASE  04-04-15   [facility]  . CATARACT EXTRACTION  07-31-11 and 09-18-11  . Catheter ablation  1991   for WPW  . cervical fusion    . CHOLECYSTECTOMY  09-07-14  . HAND SURGERY     right 1993; left 2005  . HERNIA REPAIR  1955  . INSERT / REPLACE / REMOVE PACEMAKER    . JOINT REPLACEMENT Left 2013   knee  . JOINT REPLACEMENT Right 2004   knee  . KNEE SURGERY     left knee 1991 and 1992; right knee 1995  . LEFT HEART CATHETERIZATION WITH CORONARY ANGIOGRAM N/A 08/26/2014   Procedure: LEFT HEART CATHETERIZATION WITH CORONARY ANGIOGRAM;  Surgeon: [name], MD;  Location: [facility] C[facility] LAB;  Service: Cardiovascular;  Laterality: N/A;  . LUMBAR LAMINECTOMY/DECOMPRESSION MICRODISCECTOMY N/A 06/07/2014   Procedure: LUMBAR FOUR TO FIVE LUMBAR LAMINECTOMY/DECOMPRESSION MICRODISCECTOMY 1 LEVEL;  Surgeon: [name], MD;  Location: [place] NEURO ORS;  Service: Neurosurgery;  Laterality: N/A;  .  LUNG BIOPSY Right 2016   Dr [name]  . PACEMAKER INSERTION     PPM-- St Jude 11/30/10 by [name]  . PROSTATE SURGERY     cancer--1998, prostatectomy  . REPLACEMENT TOTAL KNEE     2004  . ruptured disc     1962 and 1998  . TRIGGER FINGER RELEASE  01-24-15  . WOUND DEBRIDEMENT Left 06/07/2015   Procedure: DEBRIDEMENT WOUND;  Surgeon: Robert Bellow, MD;  Location: [facility] ORS;  Service: General;  Laterality: Left;  left upper back    There were no vitals filed for this visit.      Subjective Assessment - 05/13/17 0859    Subjective Pt reports, "I feel like I'm back to normal." He reports complete resolution of symptoms. No further episodes of vertigo or dizziness. No falls since last therapy session. No specific questions or concerns at this time. Pt reports he is ready for discharge.   Pertinent  History Patient states that "you got that stuff straightened out" in 2015 in reference to prior vestibular therapy for dizziness and vertigo. Patient states he had a fall on March 3rd when he got up at 3 am to let the dog out. Patient states he does not remember whether he was dizzy or not prior to or after the fall. Patient reports this is why he is returning to vestibular therapy. Patient states he has not been having vertigo or dizziness, but rather balance issues primarily at present. Patient states he has not been using any AD for mobility.    Patient Stated Goals Patient would like to be able to install an overhead lightbulb, lie in the prone position to shoot a rifle, be able to shave his chin and be able to get on the floor and look up underneath a table   Currently in Pain? No/denies            [facility] PT Assessment - 05/13/17 0916      Observation/Other Assessments   Other Surveys  Other Surveys   Activities of Balance Confidence Scale (ABC Scale)  91.88%   Dizziness Handicap Inventory (DHI)  10/100     Standardized Balance Assessment   Standardized Balance Assessment Dynamic Gait Index;Five Times Sit to Stand   Five times sit to stand comments  13.2 seconds     Dynamic Gait Index   Level Surface Normal   Change in Gait Speed Mild Impairment   Gait with Horizontal Head Turns Normal   Gait with Vertical Head Turns Mild Impairment   Gait and Pivot Turn Normal   Step Over Obstacle Mild Impairment   Step Around Obstacles Normal   Steps Mild Impairment   Total Score 20       TREATMENT  Neuromuscular Re-education  Outcome Measures Pt completed ABC and DHI (unbilled); Scored and discussed results of self-report surverys with patient; Performed DGI with pt scoring 20/24 (improved from 18/24 at last test); Performed 5TSTS and pt improved from 16.5s at initial evaluation 13.2s today; Updated goals and discussed plan of care with patient;  Marye Round Patient with  limited cervical extension ROM therefore performed testing on inclined mat table. On inverted mat table performed right and left Dix-Hallpike testing and it was negative bilaterally with no nystagmus observed and no vertigo reported   HEP Review Performed semitandem balance with horizontal head turns alternating LE 30s x 2 bilateral; Sit to stand without UE support 2 x 10; Mini squats x 10 with UE support to allow for improved  form with squats (increased hip flexion and weight more on heels to protect knees); Single leg balance 30s total on each leg;  Education about indications for when to return for therapy if needed.                      PT Education - 05/13/17 0859    Education provided Yes   Education Details Goals updated, discharge   Person(s) Educated Patient   Methods Explanation   Comprehension Verbalized understanding;Returned demonstration             PT Long Term Goals - 05/13/17 0902      PT LONG TERM GOAL #1   Title Patient will demonstrate reduced falls risk as evidenced by Dynamic Gait Index (DGI) >19/24 by 06/17/2017.   Baseline scored 16/24 on 03/04/2017; scored 18/24 on 04/22/2017; 05/13/17: 20/24   Time 8   Period Weeks   Status Achieved     PT LONG TERM GOAL #2   Title Patient will reduce perceived disability to low levels as indicated by <40 on Dizziness Handicap Inventory.   Baseline scored 46/100 on 03/04/2017; scored 32/100 on 04/22/2017; 05/13/17: 10/100   Time 8   Period Weeks   Status Achieved     PT LONG TERM GOAL #3   Title Patient reports no vertigo with provoking motions or positions.   Time 8   Period Weeks   Status Achieved     PT LONG TERM GOAL #4   Title Patient will report 50% or greater improvement in his symptoms of dizziness and imbalance with provoking motions or positions by 06/17/2017.   Baseline patient unable to report % improvement on 04/22/2017; 05/13/17: 100% improved   Time 8   Period Weeks   Status Achieved      PT LONG TERM GOAL #5   Title Patient will have demonstrate decreased falls risk as indicated by Activities Specific Balance Confidence Scale score of 80% or greater.   Baseline scored 62.5% on 03/04/2017; scored 61.25% on 04/22/2017; 05/13/17: 91.88%   Status Achieved     PT LONG TERM GOAL #6   Title Patient will be able to shave without symptoms of dizziness.    Time 8   Period Weeks   Status Achieved               Plan - 05/13/17 0902    Clinical Impression Statement Pt reports 100% improvement in his symptoms since starting therapy. He reports no further episodes of vertigo or dizziness. Dix-Hallpike test is negative bilaterally on this date. DGI improved from 16/24 on initial evaluation to 20/24 today. ABC improved from 62.5% balance confidence to 91.88% and DHI decreased from 46/100 to 10/100. Pt was provided with extensive home exercise program that he can utilize to continue to improve his balance at home. Pt will be discharged today having met all of his goals and per his preference.    Rehab Potential Fair   Clinical Impairments Affecting Rehab Potential Positive Indicators: motivated, has improved with vestibular therapy in the past  Negative Indicators: chronicity, comorbidities   PT Frequency --  2 times a week for 2 weeks and then 1 time a week for 6 weeks   PT Duration 8 weeks   PT Treatment/Interventions Canalith Repostioning;Neuromuscular re-education;Balance training;Therapeutic exercise;Therapeutic activities;Functional mobility training;Stair training;Gait training;Patient/family education;Vestibular   PT Next Visit Plan Discharge   PT Home Exercise Plan semi/full tandem stance 30s holds without head turns, sit to stand 2 x  10 BID; mini squats 2 sets of 20 with 5 second holds   Consulted and Agree with Plan of Care Patient      Patient will benefit from skilled therapeutic intervention in order to improve the following deficits and impairments:  Decreased balance,  Dizziness, Difficulty walking, Decreased strength  Visit Diagnosis: Dizziness and giddiness  Difficulty in walking, not elsewhere classified       G-Codes - 29-May-2017 7615    Functional Assessment Tool Used (Outpatient Only) clinical judgment, DHI, ABC scale, DGI, 5TSTS   Functional Limitation Mobility: Walking and moving around   Mobility: Walking and Moving Around Goal Status [phone]) At least 1 percent but less than 20 percent impaired, limited or restricted   Mobility: Walking and Moving Around Discharge Status [phone]) At least 1 percent but less than 20 percent impaired, limited or restricted      Problem List Patient Active Problem List   Diagnosis Date Noted  . Malignant neoplasm of right lung ([place]) 04/18/2016  . CAD (coronary artery disease) 04/01/2016  . Adenocarcinoma ([place]) 04/01/2016  . Skin cyst 12/26/2015  . GERD (gastroesophageal reflux disease) 06/16/2015  . Abscess of back 06/06/2015  . Vertigo 03/27/2015  . Carpal tunnel syndrome 10/28/2014  . Status post cholecystectomy 09/29/2014  . Disease of digestive tract 09/29/2014  . Nonischemic cardiomyopathy ([place]) 09/26/2014  . Cardiomyopathy ([place]) 09/26/2014  . Diabetes mellitus without complication ([place])   . Sleep apnea   . Spinal stenosis, lumbar region, with neurogenic claudication 06/07/2014  . Lumbar stenosis with neurogenic claudication 06/07/2014  . Abnormal gait 08/20/2012  . H/O total knee replacement 08/20/2012  . Arthritis of knee, degenerative 08/20/2012  . Pacemaker-St.Jude 08/03/2012  . Cardiac conduction disorder 06/19/2012  . Acid reflux 06/18/2012  . 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 Triggering of digit 03/25/2012  . Hyperlipidemia 12/23/2011  . Essential hypertension 03/23/2011  . Complete heart block ([place]) 03/23/2011  . Complete atrioventricular block ([place]) 03/23/2011   Phillips Grout PT, DPT   Huprich,Jason May 29, 2017, 11:06 AM  [place] MAIN  [facility] SERVICES [address] [place], [place], [address] Phone: [phone]   Fax:  [phone]  Name: Howard [name] MRN: [id number] Date of Birth: [birth year]

## 2017-05-20 ENCOUNTER — Encounter: Payer: Medicare Other | Admitting: Physical Therapy

## 2017-05-27 ENCOUNTER — Encounter: Payer: Medicare Other | Admitting: Physical Therapy

## 2017-07-02 ENCOUNTER — Ambulatory Visit (INDEPENDENT_AMBULATORY_CARE_PROVIDER_SITE_OTHER): Payer: Medicare Other | Admitting: *Deleted

## 2017-07-02 DIAGNOSIS — I442 Atrioventricular block, complete: Secondary | ICD-10-CM

## 2017-07-02 NOTE — Progress Notes (Signed)
Remote pacemaker transmission.   

## 2017-07-03 LAB — CUP PACEART REMOTE DEVICE CHECK
Battery Remaining Longevity: 31 mo
Battery Remaining Percentage: 35 %
Battery Voltage: 2.83 V
Brady Statistic AP VP Percent: 3.4 %
Brady Statistic AP VS Percent: 1 %
Brady Statistic AS VS Percent: 1 %
Brady Statistic RA Percent Paced: 3 %
Brady Statistic RV Percent Paced: 99 %
Implantable Lead Implant Date: 20111208
Implantable Lead Location: 753859
Implantable Lead Location: 753860
Implantable Lead Model: 1948
Implantable Pulse Generator Implant Date: 20111208
Lead Channel Impedance Value: 350 Ohm
Lead Channel Impedance Value: 410 Ohm
Lead Channel Pacing Threshold Amplitude: 0.5 V
Lead Channel Pacing Threshold Amplitude: 0.75 V
Lead Channel Pacing Threshold Pulse Width: 0.4 ms
Lead Channel Sensing Intrinsic Amplitude: 2.6 mV
Lead Channel Setting Pacing Pulse Width: 0.8 ms
Lead Channel Setting Sensing Sensitivity: 4 mV
MDC IDC LEAD IMPLANT DT: 20111208
MDC IDC MSMT LEADCHNL RV PACING THRESHOLD PULSEWIDTH: 0.8 ms
MDC IDC MSMT LEADCHNL RV SENSING INTR AMPL: 12 mV
MDC IDC PG SERIAL: 7196739
MDC IDC SESS DTM: 20180711060009
MDC IDC SET LEADCHNL RA PACING AMPLITUDE: 2 V
MDC IDC SET LEADCHNL RV PACING AMPLITUDE: 2.5 V
MDC IDC STAT BRADY AS VP PERCENT: 96 %

## 2017-07-08 ENCOUNTER — Encounter: Payer: Self-pay | Admitting: Cardiology

## 2017-07-15 DIAGNOSIS — D044 Carcinoma in situ of skin of scalp and neck: Secondary | ICD-10-CM | POA: Diagnosis not present

## 2017-07-15 DIAGNOSIS — Z85828 Personal history of other malignant neoplasm of skin: Secondary | ICD-10-CM | POA: Diagnosis not present

## 2017-07-15 DIAGNOSIS — L570 Actinic keratosis: Secondary | ICD-10-CM | POA: Diagnosis not present

## 2017-07-15 DIAGNOSIS — X32XXXA Exposure to sunlight, initial encounter: Secondary | ICD-10-CM | POA: Diagnosis not present

## 2017-07-15 DIAGNOSIS — D0461 Carcinoma in situ of skin of right upper limb, including shoulder: Secondary | ICD-10-CM | POA: Diagnosis not present

## 2017-07-15 DIAGNOSIS — L905 Scar conditions and fibrosis of skin: Secondary | ICD-10-CM | POA: Diagnosis not present

## 2017-07-15 DIAGNOSIS — D485 Neoplasm of uncertain behavior of skin: Secondary | ICD-10-CM | POA: Diagnosis not present

## 2017-07-15 DIAGNOSIS — Z08 Encounter for follow-up examination after completed treatment for malignant neoplasm: Secondary | ICD-10-CM | POA: Diagnosis not present

## 2017-07-15 DIAGNOSIS — L218 Other seborrheic dermatitis: Secondary | ICD-10-CM | POA: Diagnosis not present

## 2017-07-29 DIAGNOSIS — K5909 Other constipation: Secondary | ICD-10-CM | POA: Diagnosis not present

## 2017-07-29 DIAGNOSIS — K219 Gastro-esophageal reflux disease without esophagitis: Secondary | ICD-10-CM | POA: Diagnosis not present

## 2017-07-31 DIAGNOSIS — M48062 Spinal stenosis, lumbar region with neurogenic claudication: Secondary | ICD-10-CM | POA: Diagnosis not present

## 2017-08-07 ENCOUNTER — Other Ambulatory Visit: Payer: Self-pay | Admitting: Neurosurgery

## 2017-08-07 DIAGNOSIS — M48062 Spinal stenosis, lumbar region with neurogenic claudication: Secondary | ICD-10-CM

## 2017-08-11 DIAGNOSIS — C4442 Squamous cell carcinoma of skin of scalp and neck: Secondary | ICD-10-CM | POA: Diagnosis not present

## 2017-08-11 DIAGNOSIS — Z85828 Personal history of other malignant neoplasm of skin: Secondary | ICD-10-CM | POA: Diagnosis not present

## 2017-08-18 ENCOUNTER — Ambulatory Visit
Admission: RE | Admit: 2017-08-18 | Discharge: 2017-08-18 | Disposition: A | Discharge status: Home / Self Care | Payer: Medicare Other | Source: Ambulatory Visit | Attending: Neurosurgery | Admitting: Neurosurgery

## 2017-08-18 VITALS — BP 132/61 | HR 68

## 2017-08-18 DIAGNOSIS — M48062 Spinal stenosis, lumbar region with neurogenic claudication: Secondary | ICD-10-CM

## 2017-08-18 MED ORDER — DIAZEPAM 5 MG PO TABS
5.0000 mg | ORAL_TABLET | Freq: Once | ORAL | Status: AC
  Administered 2017-08-18: 5 mg via ORAL

## 2017-08-18 MED ORDER — IOPAMIDOL (ISOVUE-M 200) INJECTION 41%
15.0000 mL | Freq: Once | INTRAMUSCULAR | Status: AC
  Administered 2017-08-18: 15 mL via INTRATHECAL

## 2017-08-18 MED ORDER — ONDANSETRON HCL 4 MG/2ML IJ SOLN: 4.0000 mg | Freq: Four times a day (QID) | INTRAMUSCULAR | Status: DC | PRN

## 2017-08-18 NOTE — Discharge Instructions (Signed)

## 2017-08-20 DIAGNOSIS — G4733 Obstructive sleep apnea (adult) (pediatric): Secondary | ICD-10-CM | POA: Diagnosis not present

## 2017-08-21 DIAGNOSIS — M48062 Spinal stenosis, lumbar region with neurogenic claudication: Secondary | ICD-10-CM | POA: Diagnosis not present

## 2017-08-23 ENCOUNTER — Other Ambulatory Visit: Payer: Self-pay | Admitting: Cardiovascular Disease

## 2017-08-26 DIAGNOSIS — M48062 Spinal stenosis, lumbar region with neurogenic claudication: Secondary | ICD-10-CM | POA: Diagnosis not present

## 2017-09-30 ENCOUNTER — Other Ambulatory Visit: Payer: Self-pay | Admitting: Internal Medicine

## 2017-09-30 ENCOUNTER — Encounter: Payer: Self-pay | Admitting: Family Medicine

## 2017-09-30 ENCOUNTER — Ambulatory Visit (INDEPENDENT_AMBULATORY_CARE_PROVIDER_SITE_OTHER): Payer: Medicare Other | Admitting: Family Medicine

## 2017-09-30 VITALS — BP 138/74 | HR 71 | Temp 97.9°F | Wt 190.4 lb

## 2017-09-30 DIAGNOSIS — G4733 Obstructive sleep apnea (adult) (pediatric): Secondary | ICD-10-CM | POA: Diagnosis not present

## 2017-09-30 DIAGNOSIS — I2510 Atherosclerotic heart disease of native coronary artery without angina pectoris: Secondary | ICD-10-CM

## 2017-09-30 DIAGNOSIS — Z950 Presence of cardiac pacemaker: Secondary | ICD-10-CM | POA: Diagnosis not present

## 2017-09-30 DIAGNOSIS — E119 Type 2 diabetes mellitus without complications: Secondary | ICD-10-CM | POA: Diagnosis not present

## 2017-09-30 DIAGNOSIS — I10 Essential (primary) hypertension: Secondary | ICD-10-CM

## 2017-09-30 DIAGNOSIS — Z23 Encounter for immunization: Secondary | ICD-10-CM | POA: Diagnosis not present

## 2017-09-30 DIAGNOSIS — E782 Mixed hyperlipidemia: Secondary | ICD-10-CM

## 2017-09-30 DIAGNOSIS — M48062 Spinal stenosis, lumbar region with neurogenic claudication: Secondary | ICD-10-CM

## 2017-09-30 LAB — CBC WITH DIFFERENTIAL/PLATELET
BASOS PCT: 0.9 %
Basophils Absolute: 51 cells/uL (ref 0–200)
EOS ABS: 450 {cells}/uL (ref 15–500)
EOS PCT: 7.9 %
HCT: 44 % (ref 38.5–50.0)
Hemoglobin: 14.2 g/dL (ref 13.2–17.1)
Lymphs Abs: 1938 cells/uL (ref 850–3900)
MCH: 25.4 pg — AB (ref 27.0–33.0)
MCHC: 32.3 g/dL (ref 32.0–36.0)
MCV: 78.6 fL — ABNORMAL LOW (ref 80.0–100.0)
MONOS PCT: 11.1 %
MPV: 10 fL (ref 7.5–12.5)
NEUTROS ABS: 2628 {cells}/uL (ref 1500–7800)
Neutrophils Relative %: 46.1 %
PLATELETS: 237 10*3/uL (ref 140–400)
RBC: 5.6 10*6/uL (ref 4.20–5.80)
RDW: 15 % (ref 11.0–15.0)
TOTAL LYMPHOCYTE: 34 %
WBC mixed population: 633 cells/uL (ref 200–950)
WBC: 5.7 10*3/uL (ref 3.8–10.8)

## 2017-09-30 LAB — COMPLETE METABOLIC PANEL WITH GFR
AG Ratio: 1.8 (calc) (ref 1.0–2.5)
ALBUMIN MSPROF: 4.5 g/dL (ref 3.6–5.1)
ALT: 20 U/L (ref 9–46)
AST: 18 U/L (ref 10–35)
Alkaline phosphatase (APISO): 48 U/L (ref 40–115)
BILIRUBIN TOTAL: 0.7 mg/dL (ref 0.2–1.2)
BUN: 14 mg/dL (ref 7–25)
CO2: 28 mmol/L (ref 20–32)
CREATININE: 0.81 mg/dL (ref 0.70–1.11)
Calcium: 9.6 mg/dL (ref 8.6–10.3)
Chloride: 99 mmol/L (ref 98–110)
GFR, EST AFRICAN AMERICAN: 95 mL/min/{1.73_m2} (ref >=60)
GFR, Est Non African American: 82 mL/min/{1.73_m2} (ref >=60)
GLOBULIN: 2.5 g/dL (ref 1.9–3.7)
GLUCOSE: 129 mg/dL — AB (ref 65–99)
Potassium: 4.3 mmol/L (ref 3.5–5.3)
Sodium: 135 mmol/L (ref 135–146)
TOTAL PROTEIN: 7 g/dL (ref 6.1–8.1)

## 2017-09-30 LAB — POCT UA - MICROALBUMIN: Microalbumin Ur, POC: 50 mg/L

## 2017-09-30 LAB — LIPID PANEL
CHOLESTEROL: 135 mg/dL (ref <200)
HDL: 48 mg/dL (ref >40)
LDL CHOLESTEROL (CALC): 63 mg/dL
Non-HDL Cholesterol (Calc): 87 mg/dL (calc) (ref <130)
Total CHOL/HDL Ratio: 2.8 (calc) (ref <5.0)
Triglycerides: 154 mg/dL — ABNORMAL HIGH (ref <150)

## 2017-09-30 LAB — POCT GLYCOSYLATED HEMOGLOBIN (HGB A1C): HEMOGLOBIN A1C: 7

## 2017-09-30 LAB — TSH: TSH: 1.09 mIU/L (ref 0.40–4.50)

## 2017-09-30 NOTE — Progress Notes (Signed)
Patient: Howard [name] Male    DOB: [birth year]   83 y.o.   MRN: [id number] Visit Date: 09/30/2017  Today's Provider: [name], PA   Chief Complaint  Patient presents with  . Hypertension  . Hyperlipidemia  . Diabetes  . Follow-up   Subjective:    HPI  Diabetes Mellitus Type II, Follow-up:   RecentLabs   Lab Results  Component Value Date   HGBA1C 7.0 09/30/2017   Last seen for diabetes 8 months ago.  Management since then includes continue Metformin 500 mg BID and Welchol 3.75 gm qd with diabetic diet and exercise. He reports fair compliance with treatment.  He is not having side effects.  Current symptoms include none  Home blood sugar records: fasting range: 140-150's  Episodes of hypoglycemia? no              Current Insulin Regimen: none Weight trend: stable Current diet: restricting sweets some Current exercise: none due to back pain   ------------------------------------------------------------------------   Hypertension, follow-up:     BP Readings from Last 3 Encounters:  02/18/17 (!) 148/70  10/28/16 (!) 156/74  10/24/16 122/69    He was last seen for hypertension 8 months ago.  BP at that visit was 148/70 Management since that visit includes continue HCTZ 25 mg qd, Amlodipine 5 mg qd, Lisinopril 10 mg qd, Metoprolol-SL 25 mg qd and diet and exercise. He reports fair compliance with treatment. He is not having side effects.  He is not exercising. He is adherent to low salt diet.   Outside blood pressures are not being checked. He is experiencing none.  Patient denies chest pain, chest pressure/discomfort, irregular heart beat and palpitations.   Cardiovascular risk factors include advanced age (older than 97 for men, 82 for women), diabetes mellitus, dyslipidemia, hypertension and male gender.  Use of agents associated with hypertension: none.    ------------------------------------------------------------------------    Lipid/Cholesterol, Follow-up:   Last seen for this 8 months ago.  Management since that visit includes continue Simvastatin 40 mg qd and Welchol 3.75 gm qd,low fat diet and exercise.  Last Lipid Panel: Labs(Brief)   Lab Results  Component Value Date   CHOL 144 02/18/2017   HDL 46 02/18/2017   LDLCALC 70 02/18/2017   TRIG 138 02/18/2017   CHOLHDL 3.1 02/18/2017    He reports fair compliance with treatment. He is having side effects with Welchol.  Patient reports he is unable to take medication. He is not able to exercise at this time due to back pain.      Wt Readings from Last 3 Encounters:  02/18/17 196 lb 6.4 oz (89.1 kg)  10/28/16 194 lb 8.9 oz (88.3 kg)  10/24/16 195 lb 9.6 oz (88.7 kg)    Past Medical History:  Diagnosis Date  . Arthritis   . Bell palsy   . Cancer [facility]    prostate and skin  . Chronic combined systolic and diastolic CHF, NYHA class 1 ([place])    a. 07/2014 Echo: EF 35-40%, Gr 1 DD.  Marland Kitchen Complete heart block ([place])    a. 11/2010 s/p SJM 2210 Accent DC PPM, ser# [id number].  . Depression   . Diabetes mellitus without complication ([place])   . Fall 11-10-14  . GERD (gastroesophageal reflux disease)   . History of prostate cancer   . Hyperlipidemia   . Hypertension   . LBBB (left bundle branch block)   . Left-sided Bell's palsy   . Lung cancer ([place]) 2016  .  NICM (nonischemic cardiomyopathy) ([place])    a. 07/2014 Echo: EF 35-40%, mid-apicalanteroseptal DK, Gr 1 DD, mild-mod dil LA.  . Non-obstructive CAD    a. 07/2014 Abnl MV;  b. 08/2014 Cath: LM nl, LAD 30p, RI 40p, LCX nl, OM1 40, RCA dominant 30p, 70d-->Med Rx.  Marland Kitchen Poor balance   . Presence of permanent cardiac pacemaker   . Sleep apnea    a. cpap  . Vertigo   . WPW (Wolff-Parkinson-White syndrome)    a. S/P RFCA 1991.   Past Surgical History:  Procedure Laterality Date  . APPLICATION OF WOUND VAC Left 06/07/2015    Procedure: APPLICATION OF WOUND VAC;  Surgeon: Robert Bellow, MD;  Location: [facility] ORS;  Service: General;  Laterality: Left;  left upper back  . BACK SURGERY     2011  . CARDIAC CATHETERIZATION  08/26/2014   Single vessel obstructive CAD  . CARPAL TUNNEL RELEASE  04-04-15   [facility]  . CATARACT EXTRACTION  07-31-11 and 09-18-11  . Catheter ablation  1991   for WPW  . cervical fusion    . CHOLECYSTECTOMY  09-07-14  . HAND SURGERY     right 1993; left 2005  . HERNIA REPAIR  1955  . INSERT / REPLACE / REMOVE PACEMAKER    . JOINT REPLACEMENT Left 2013   knee  . JOINT REPLACEMENT Right 2004   knee  . KNEE SURGERY     left knee 1991 and 1992; right knee 1995  . LEFT HEART CATHETERIZATION WITH CORONARY ANGIOGRAM N/A 08/26/2014   Procedure: LEFT HEART CATHETERIZATION WITH CORONARY ANGIOGRAM;  Surgeon: [name], MD;  Location: [facility] CATH LAB;  Service: Cardiovascular;  Laterality: N/A;  . LUMBAR LAMINECTOMY/DECOMPRESSION MICRODISCECTOMY N/A 06/07/2014   Procedure: LUMBAR FOUR TO FIVE LUMBAR LAMINECTOMY/DECOMPRESSION MICRODISCECTOMY 1 LEVEL;  Surgeon: [name], MD;  Location: [place] NEURO ORS;  Service: Neurosurgery;  Laterality: N/A;  . LUNG BIOPSY Right 2016   Dr [name]  . PACEMAKER INSERTION     PPM-- St Jude 11/30/10 by [name]  . PROSTATE SURGERY     cancer--1998, prostatectomy  . REPLACEMENT TOTAL KNEE     2004  . ruptured disc     1962 and 1998  . TRIGGER FINGER RELEASE  01-24-15  . WOUND DEBRIDEMENT Left 06/07/2015   Procedure: DEBRIDEMENT WOUND;  Surgeon: Robert Bellow, MD;  Location: [facility] ORS;  Service: General;  Laterality: Left;  left upper back   Family History  Problem Relation Age of Onset  . Heart attack Mother   . Hyperlipidemia Mother   . CAD Unknown   . Prostate cancer Neg Hx    Allergies  Allergen Reactions  . Sulfonamide Derivatives Rash    Previous Medications   AMLODIPINE (NORVASC) 5 MG TABLET    TAKE 1 TABLET DAILY   ASPIRIN 81 MG TABLET    Take 81 mg by mouth  daily.     CHOLECALCIFEROL (VITAMIN D3) 1000 UNITS CAPS    Take 1,000 Units by mouth daily.    HYDROCHLOROTHIAZIDE (HYDRODIURIL) 25 MG TABLET    TAKE 1 TABLET DAILY   LISINOPRIL (PRINIVIL,ZESTRIL) 10 MG TABLET    TAKE 1 TABLET DAILY   METFORMIN (GLUCOPHAGE) 500 MG TABLET    TAKE 1 TABLET TWICE A DAY   METOPROLOL SUCCINATE (TOPROL-XL) 25 MG 24 HR TABLET    TAKE 1 TABLET DAILY   MULTIPLE VITAMIN (MULTIVITAMIN) CAPSULE    Take 1 capsule by mouth daily.     OMEPRAZOLE (  PRILOSEC) 40 MG CAPSULE    TAKE 1 CAPSULE TWICE DAILY   PSYLLIUM (METAMUCIL) 58.6 % POWDER    Take 1 packet by mouth 3 (three) times daily.   RANITIDINE (ZANTAC) 300 MG CAPSULE    Take 300 mg by mouth every evening.   SIMVASTATIN (ZOCOR) 40 MG TABLET    Take 1 tablet (40 mg total) by mouth at bedtime.    Review of Systems  Constitutional: Negative.   Respiratory: Negative.   Cardiovascular: Negative.   Endocrine: Negative.   Musculoskeletal: Negative.     Social History  Substance Use Topics  . Smoking status: Former Smoker    Years: 4.00  . Smokeless tobacco: Never Used     Comment: Quit 2011  . Alcohol use 0.6 oz/week    1 Cans of beer per week     Comment: occasional   Objective:   BP 138/74 (BP Location: Right Arm, Patient Position: Sitting, Cuff Size: Normal)   Pulse 71   Temp 97.9 F (36.6 C) (Oral)   Wt 190 lb 6.4 oz (86.4 kg)   SpO2 94%   BMI 28.12 kg/m   Physical Exam  Constitutional: He is oriented to person, place, and time. He appears well-developed and well-nourished. No distress.  HENT:  Head: Normocephalic and atraumatic.  Right Ear: Hearing and external ear normal.  Left Ear: Hearing and external ear normal.  Nose: Nose normal.  Mouth/Throat: Oropharynx is clear and moist.  Wearing bilateral hearing aids.  Eyes: Conjunctivae and lids are normal. Right eye exhibits no discharge. Left eye exhibits no discharge. No scleral icterus.  Neck: Neck supple.  Cardiovascular: Normal rate and regular  rhythm.   Pacemaker in left upper chest.  Pulmonary/Chest: Effort normal and breath sounds normal. No respiratory distress.  Abdominal: Soft. Bowel sounds are normal.  Musculoskeletal:  Well healed lower lumbar scars from past laminectomies. Followed by Dr. Carloyn Manner (neurosurgeon) regularly.  Neurological: He is alert and oriented to person, place, and time.  Skin: Skin is intact. No lesion and no rash noted.  Psychiatric: He has a normal mood and affect. His speech is normal and behavior is normal. Thought content normal.      Assessment & Plan:     1. Diabetes mellitus without complication ([place]) No hypoglycemic episodes. Continues to take Metformin 500 mg BID and following diet. Normal foot exam today and had ophthalmology exam April 2018 without retinopathy. Hgb A1C 7.0% today (was 7.5% on 02-18-17). Recheck routine labs and he will try to get a urine specimen to check microalbumin level. Follow up pending reports. - POCT UA - Microalbumin - POCT HgB A1C - CBC with Differential/Platelet - Comprehensive metabolic panel  2. Essential hypertension Well controlled today. Still taking Lisinopril, HCTZ, Metoprolol and Amlodipine. Recheck routine labs and follow up pending reports. - CBC with Differential/Platelet - Comprehensive metabolic panel - Lipid panel - TSH  3. Mixed hyperlipidemia Tolerating Simvastatin 40 mg qd without side effects. Will check follow up labs and encouraged to continue low fat diet. Recheck pending reports. - Comprehensive metabolic panel - Lipid panel - TSH  4. Obstructive sleep apnea syndrome Continues CPAP at 7 cm H2O by mask at night. Followed by Dr. Raul Del (pulmonologist).  5. Pacemaker-St.Jude Followed by Dr. Caryl Comes (cardiologist) to interrogate his pacemaker. No chest pains or palpitations.  6. Lumbar stenosis with neurogenic claudication Back pains with bending over or climbing up a hill. Followed by Dr. Carloyn Manner (neurosurgeon) and in the process of getting  spinal injections.  7. Need for influenza vaccination - Flu vaccine HIGH DOSE PF  8. Need for Streptococcus pneumoniae vaccination - Pneumococcal polysaccharide vaccine 23-valent greater than or equal to 81yo subcutaneous/IM

## 2017-10-01 ENCOUNTER — Ambulatory Visit (INDEPENDENT_AMBULATORY_CARE_PROVIDER_SITE_OTHER): Payer: Medicare Other | Admitting: *Deleted

## 2017-10-01 DIAGNOSIS — I442 Atrioventricular block, complete: Secondary | ICD-10-CM | POA: Diagnosis not present

## 2017-10-01 DIAGNOSIS — C44622 Squamous cell carcinoma of skin of right upper limb, including shoulder: Secondary | ICD-10-CM | POA: Diagnosis not present

## 2017-10-01 DIAGNOSIS — D0461 Carcinoma in situ of skin of right upper limb, including shoulder: Secondary | ICD-10-CM | POA: Diagnosis not present

## 2017-10-01 DIAGNOSIS — L905 Scar conditions and fibrosis of skin: Secondary | ICD-10-CM | POA: Diagnosis not present

## 2017-10-01 NOTE — Progress Notes (Signed)
Remote pacemaker transmission.   

## 2017-10-03 ENCOUNTER — Encounter: Payer: Self-pay | Admitting: Cardiology

## 2017-10-14 ENCOUNTER — Other Ambulatory Visit: Payer: Self-pay | Admitting: Family Medicine

## 2017-10-14 ENCOUNTER — Other Ambulatory Visit: Payer: Self-pay | Admitting: Internal Medicine

## 2017-10-15 DIAGNOSIS — M48062 Spinal stenosis, lumbar region with neurogenic claudication: Secondary | ICD-10-CM | POA: Diagnosis not present

## 2017-10-15 DIAGNOSIS — I10 Essential (primary) hypertension: Secondary | ICD-10-CM | POA: Diagnosis not present

## 2017-10-15 DIAGNOSIS — Z6828 Body mass index (BMI) 28.0-28.9, adult: Secondary | ICD-10-CM | POA: Diagnosis not present

## 2017-10-21 ENCOUNTER — Other Ambulatory Visit: Payer: Self-pay | Admitting: Family Medicine

## 2017-10-21 ENCOUNTER — Encounter: Payer: Self-pay | Admitting: Internal Medicine

## 2017-10-21 ENCOUNTER — Other Ambulatory Visit: Payer: Self-pay | Admitting: Internal Medicine

## 2017-10-21 ENCOUNTER — Other Ambulatory Visit: Payer: Self-pay | Admitting: Cardiovascular Disease

## 2017-10-21 ENCOUNTER — Ambulatory Visit (INDEPENDENT_AMBULATORY_CARE_PROVIDER_SITE_OTHER): Payer: Medicare Other | Admitting: Internal Medicine

## 2017-10-21 VITALS — BP 130/64 | HR 73 | Ht 69.0 in | Wt 191.6 lb

## 2017-10-21 DIAGNOSIS — E119 Type 2 diabetes mellitus without complications: Secondary | ICD-10-CM

## 2017-10-21 DIAGNOSIS — I442 Atrioventricular block, complete: Secondary | ICD-10-CM | POA: Diagnosis not present

## 2017-10-21 DIAGNOSIS — I2510 Atherosclerotic heart disease of native coronary artery without angina pectoris: Secondary | ICD-10-CM

## 2017-10-21 DIAGNOSIS — Z950 Presence of cardiac pacemaker: Secondary | ICD-10-CM | POA: Diagnosis not present

## 2017-10-21 DIAGNOSIS — I428 Other cardiomyopathies: Secondary | ICD-10-CM

## 2017-10-21 LAB — CUP PACEART REMOTE DEVICE CHECK
Battery Remaining Percentage: 35 %
Battery Voltage: 2.83 V
Brady Statistic AP VP Percent: 3.4 %
Brady Statistic AS VS Percent: 1 %
Brady Statistic RA Percent Paced: 2.9 %
Brady Statistic RV Percent Paced: 99 %
Date Time Interrogation Session: 20181010060009
Implantable Lead Location: 753860
Implantable Lead Model: 1948
Implantable Pulse Generator Implant Date: 20111208
Lead Channel Pacing Threshold Amplitude: 0.5 V
Lead Channel Pacing Threshold Pulse Width: 0.8 ms
Lead Channel Sensing Intrinsic Amplitude: 12 mV
Lead Channel Setting Pacing Amplitude: 2 V
Lead Channel Setting Pacing Amplitude: 2.5 V
MDC IDC LEAD IMPLANT DT: 20111208
MDC IDC LEAD IMPLANT DT: 20111208
MDC IDC LEAD LOCATION: 753859
MDC IDC MSMT BATTERY REMAINING LONGEVITY: 31 mo
MDC IDC MSMT LEADCHNL RA IMPEDANCE VALUE: 430 Ohm
MDC IDC MSMT LEADCHNL RA PACING THRESHOLD AMPLITUDE: 0.75 V
MDC IDC MSMT LEADCHNL RA PACING THRESHOLD PULSEWIDTH: 0.4 ms
MDC IDC MSMT LEADCHNL RA SENSING INTR AMPL: 3.1 mV
MDC IDC MSMT LEADCHNL RV IMPEDANCE VALUE: 350 Ohm
MDC IDC SET LEADCHNL RV PACING PULSEWIDTH: 0.8 ms
MDC IDC SET LEADCHNL RV SENSING SENSITIVITY: 4 mV
MDC IDC STAT BRADY AP VS PERCENT: 1 %
MDC IDC STAT BRADY AS VP PERCENT: 96 %
Pulse Gen Model: 2210
Pulse Gen Serial Number: 7196739

## 2017-10-21 MED ORDER — METOPROLOL SUCCINATE ER 25 MG PO TB24: 25.0000 mg | ORAL_TABLET | Freq: Every day | ORAL | 3 refills | Status: DC

## 2017-10-21 MED ORDER — SIMVASTATIN 40 MG PO TABS: 40.0000 mg | ORAL_TABLET | Freq: Every day | ORAL | 3 refills | Status: DC

## 2017-10-21 NOTE — Patient Instructions (Addendum)
Medication Instructions: Your physician recommends that you continue on your current medications as directed. Please refer to the Current Medication list given to you today.  Labwork: None Ordered  Procedures/Testing: Your physician has requested that you have an echocardiogram in [place]. Echocardiography is a painless test that uses sound waves to create images of your heart. It provides your doctor with information about the size and shape of your heart and how well your heart's chambers and valves are working. This procedure takes approximately one hour. There are no restrictions for this procedure.  Follow-Up: Your physician wants you to follow-up in: 1 YEAR with Dr. Caryl Comes in [place]. You will receive a reminder letter in the mail two months in advance. If you don't receive a letter, please call our office to schedule the follow-up appointment.  Remote monitoring is used to monitor your Pacemaker from home. This monitoring reduces the number of office visits required to check your device to one time per year. It allows [place] to keep an eye on the functioning of your device to ensure it is working properly. You are scheduled for a device check from home on 12/31/17. You may send your transmission at any time that day. If you have a wireless device, the transmission will be sent automatically. After your physician reviews your transmission, you will receive a postcard with your next transmission date.   If you need a refill on your cardiac medications before your next appointment, please call your pharmacy.

## 2017-10-21 NOTE — Progress Notes (Signed)
,      Patient Care Team: Chrismon, [name], PA as PCP - General (Family Medicine) Bary Castilla, Forest Gleason, MD (General Surgery) [name], MD as Consulting Physician (Cardiology) [name], MD as Consulting Physician (Cardiology) Dasher, [name], MD as Consulting Physician (Dermatology) Idelle Crouch [name], Student-RN as Student Nurse (Student) Birder Robson, MD as Referring Physician (Ophthalmology)   HPI  Howard [name] is a 83 y.o. male Senior pacemaker follow-up for device implanted for complete heart block 12/11.  He has been followed by Dr. Rayann Heman and [name].  He has nonobstructive coronary disease with a modest nonischemic cardiomyopathy  DATE TEST    8/15    Echo   EF 35-40 %   5/16    Echo   EF 45-50 %         He has mild dyspnea, but his most significant limitation to exercise is his back  Records and Results Reviewed  Past Medical History:  Diagnosis Date  . Arthritis   . Bell palsy   . Cancer [facility])    prostate and skin  . Chronic combined systolic and diastolic CHF, NYHA class 1 ([place])    a. 07/2014 Echo: EF 35-40%, Gr 1 DD.  Marland Kitchen Complete heart block ([place])    a. 11/2010 s/p SJM 2210 Accent DC PPM, ser# [id number].  . Depression   . Diabetes mellitus without complication ([place])   . Fall 11-10-14  . GERD (gastroesophageal reflux disease)   . History of prostate cancer   . Hyperlipidemia   . Hypertension   . LBBB (left bundle branch block)   . Left-sided Bell's palsy   . Lung cancer ([place]) 2016  . NICM (nonischemic cardiomyopathy) ([place])    a. 07/2014 Echo: EF 35-40%, mid-apicalanteroseptal DK, Gr 1 DD, mild-mod dil LA.  . Non-obstructive CAD    a. 07/2014 Abnl MV;  b. 08/2014 Cath: LM nl, LAD 30p, RI 40p, LCX nl, OM1 40, RCA dominant 30p, 70d-->Med Rx.  Marland Kitchen Poor balance   . Presence of permanent cardiac pacemaker   . Sleep apnea    a. cpap  . Vertigo   . WPW (Wolff-Parkinson-White syndrome)    a. S/P RFCA 1991.    Past Surgical History:    Procedure Laterality Date  . APPLICATION OF WOUND VAC Left 06/07/2015   Procedure: APPLICATION OF WOUND VAC;  Surgeon: Robert Bellow, MD;  Location: [facility] ORS;  Service: General;  Laterality: Left;  left upper back  . BACK SURGERY     2011  . CARDIAC CATHETERIZATION  08/26/2014   Single vessel obstructive CAD  . CARPAL TUNNEL RELEASE  04-04-15   [facility]  . CATARACT EXTRACTION  07-31-11 and 09-18-11  . Catheter ablation  1991   for WPW  . cervical fusion    . CHOLECYSTECTOMY  09-07-14  . HAND SURGERY     right 1993; left 2005  . HERNIA REPAIR  1955  . INSERT / REPLACE / REMOVE PACEMAKER    . JOINT REPLACEMENT Left 2013   knee  . JOINT REPLACEMENT Right 2004   knee  . KNEE SURGERY     left knee 1991 and 1992; right knee 1995  . LEFT HEART CATHETERIZATION WITH CORONARY ANGIOGRAM N/A 08/26/2014   Procedure: LEFT HEART CATHETERIZATION WITH CORONARY ANGIOGRAM;  Surgeon: [name], MD;  Location: [facility] CATH LAB;  Service: Cardiovascular;  Laterality: N/A;  . LUMBAR LAMINECTOMY/DECOMPRESSION MICRODISCECTOMY N/A 06/07/2014   Procedure: LUMBAR FOUR TO FIVE LUMBAR LAMINECTOMY/DECOMPRESSION MICRODISCECTOMY  1 LEVEL;  Surgeon: [name], MD;  Location: [place] NEURO ORS;  Service: Neurosurgery;  Laterality: N/A;  . LUNG BIOPSY Right 2016   Dr [name]  . PACEMAKER INSERTION     PPM-- St Jude 11/30/10 by [name]  . PROSTATE SURGERY     cancer--1998, prostatectomy  . REPLACEMENT TOTAL KNEE     2004  . ruptured disc     1962 and 1998  . TRIGGER FINGER RELEASE  01-24-15  . WOUND DEBRIDEMENT Left 06/07/2015   Procedure: DEBRIDEMENT WOUND;  Surgeon: Robert Bellow, MD;  Location: [facility] ORS;  Service: General;  Laterality: Left;  left upper back    Current Outpatient Prescriptions  Medication Sig Dispense Refill  . amLODipine (NORVASC) 5 MG tablet TAKE 1 TABLET DAILY 90 tablet 3  . aspirin 81 MG tablet Take 81 mg by mouth daily.      . Cholecalciferol (VITAMIN D3) 1000 UNITS CAPS Take 1,000 Units by mouth  daily.     . hydrochlorothiazide (HYDRODIURIL) 25 MG tablet TAKE 1 TABLET DAILY 90 tablet 3  . lisinopril (PRINIVIL,ZESTRIL) 10 MG tablet TAKE 1 TABLET DAILY 90 tablet 3  . metFORMIN (GLUCOPHAGE) 500 MG tablet TAKE 1 TABLET TWICE A DAY  WITH MEALS 180 tablet 3  . metoprolol succinate (TOPROL-XL) 25 MG 24 hr tablet Take 1 tablet (25 mg total) by mouth daily. 90 tablet 0  . Multiple Vitamin (MULTIVITAMIN) capsule Take 1 capsule by mouth daily.      Marland Kitchen omeprazole (PRILOSEC) 40 MG capsule TAKE 1 CAPSULE TWICE DAILY 180 capsule 3  . psyllium (METAMUCIL) 58.6 % powder Take 1 packet by mouth 3 (three) times daily.    . ranitidine (ZANTAC) 300 MG capsule Take 300 mg by mouth every evening.    . simvastatin (ZOCOR) 40 MG tablet Take 1 tablet (40 mg total) by mouth at bedtime. 14 tablet 1  . simvastatin (ZOCOR) 40 MG tablet TAKE 1 TABLET AT BEDTIME 90 tablet 0   No current facility-administered medications for this visit.     Allergies  Allergen Reactions  . Sulfa Antibiotics Rash  . Sulfonamide Derivatives Rash      Review of Systems negative except from HPI and PMH  Physical Exam BP 130/64   Pulse 73   Ht 5\' 9"  (1.753 m)   Wt 191 lb 9.6 oz (86.9 kg)   BMI 28.29 kg/m  Well developed and nourished in no acute distress HENT normal Neck supple with JVP-flat Carotids brisk and full without bruits Clear Regular rate and rhythm, no murmurs or gallops Abd-soft with active BS without hepatomegaly No Clubbing cyanosis edema Skin-warm and dry A & Oriented  Grossly normal sensory and motor function      Assessment and  Plan Nonischemic cardiomyopathy  Complete heart block  Pacemaker-[employer] Jude  Hypertension    Patient has complete heart block with 100% ventricular pacing.  He has a history of nonischemic cardiomyopathy with most recent assessment of LVEF having improved a little bit to the 45-50% range.  We will reassess.  In the event that he is depressed, we would think in terms  of switching amlodipine--carvedilol.  Anticipate pacemaker follow-up in [place]  BP well controlled  We spent more than 50% of our >25 min visit in face to face counseling regarding the above       Current medicines are reviewed at length with the patient today .  The patient does not  have concerns regarding medicines.

## 2017-10-22 LAB — CUP PACEART INCLINIC DEVICE CHECK
Battery Remaining Longevity: 26 mo
Date Time Interrogation Session: 20181030193156
Implantable Lead Implant Date: 20111208
Implantable Pulse Generator Implant Date: 20111208
Lead Channel Impedance Value: 350 Ohm
Lead Channel Pacing Threshold Amplitude: 0.75 V
Lead Channel Pacing Threshold Pulse Width: 0.4 ms
Lead Channel Pacing Threshold Pulse Width: 0.8 ms
Lead Channel Setting Pacing Amplitude: 2 V
Lead Channel Setting Pacing Amplitude: 2.5 V
Lead Channel Setting Sensing Sensitivity: 4 mV
MDC IDC LEAD IMPLANT DT: 20111208
MDC IDC LEAD LOCATION: 753859
MDC IDC LEAD LOCATION: 753860
MDC IDC MSMT BATTERY VOLTAGE: 2.81 V
MDC IDC MSMT LEADCHNL RA IMPEDANCE VALUE: 437.5 Ohm
MDC IDC MSMT LEADCHNL RA PACING THRESHOLD AMPLITUDE: 0.75 V
MDC IDC MSMT LEADCHNL RA SENSING INTR AMPL: 3.9 mV
MDC IDC MSMT LEADCHNL RV SENSING INTR AMPL: 12 mV
MDC IDC SET LEADCHNL RV PACING PULSEWIDTH: 0.8 ms
MDC IDC STAT BRADY RA PERCENT PACED: 2.9 %
MDC IDC STAT BRADY RV PERCENT PACED: 99.3 %
Pulse Gen Serial Number: 7196739

## 2017-10-22 NOTE — Telephone Encounter (Signed)
Medication Detail    Disp Refills Start End   metoprolol succinate (TOPROL-XL) 25 MG 24 hr tablet 90 tablet 3 10/21/2017    Sig - Route: Take 1 tablet (25 mg total) by mouth daily. - Oral   Sent to pharmacy as: metoprolol succinate (TOPROL-XL) 25 MG 24 hr tablet   E-Prescribing Status: Receipt confirmed by pharmacy (10/21/2017 4:29 PM EDT)   Pharmacy   [facility], [place]

## 2017-10-23 ENCOUNTER — Other Ambulatory Visit: Payer: Self-pay

## 2017-10-23 ENCOUNTER — Ambulatory Visit (INDEPENDENT_AMBULATORY_CARE_PROVIDER_SITE_OTHER): Payer: Medicare Other

## 2017-10-23 DIAGNOSIS — I428 Other cardiomyopathies: Secondary | ICD-10-CM | POA: Diagnosis not present

## 2017-10-29 ENCOUNTER — Telehealth: Payer: Self-pay | Admitting: Internal Medicine

## 2017-10-29 NOTE — Telephone Encounter (Signed)
Pt spouse calling returning our call  Echo results   Please call when we can

## 2017-10-30 ENCOUNTER — Other Ambulatory Visit: Payer: Self-pay

## 2017-10-30 ENCOUNTER — Encounter: Payer: Self-pay | Admitting: Family Medicine

## 2017-10-30 ENCOUNTER — Telehealth: Payer: Self-pay | Admitting: *Deleted

## 2017-10-30 DIAGNOSIS — Z79899 Other long term (current) drug therapy: Secondary | ICD-10-CM

## 2017-10-30 MED ORDER — SACUBITRIL-VALSARTAN 49-51 MG PO TABS: 1.0000 | ORAL_TABLET | Freq: Two times a day (BID) | ORAL | 0 refills | Status: DC

## 2017-10-30 MED ORDER — SACUBITRIL-VALSARTAN 49-51 MG PO TABS: 1.0000 | ORAL_TABLET | Freq: Two times a day (BID) | ORAL | 2 refills | Status: DC

## 2017-10-30 NOTE — Telephone Encounter (Signed)
Received PA for Entresto 49-51 mg tablet. PA is requiring NYHA class for CHF. Reviewed prior 10/21/17 OV notes noted patient is Class 1. Pt had echo 10/23/17 w/worsening EF 30-35%. Please advise if Class has changed due to new findings.

## 2017-10-30 NOTE — Telephone Encounter (Signed)
Reviewed w/wife. See results note

## 2017-10-31 ENCOUNTER — Ambulatory Visit (INDEPENDENT_AMBULATORY_CARE_PROVIDER_SITE_OTHER): Payer: Medicare Other

## 2017-10-31 VITALS — BP 136/64 | HR 64 | Temp 98.5°F | Ht 69.0 in | Wt 193.8 lb

## 2017-10-31 DIAGNOSIS — Z0000 Encounter for general adult medical examination without abnormal findings: Secondary | ICD-10-CM

## 2017-10-31 NOTE — Telephone Encounter (Signed)
Awaiting PA Approval. [employer] has not responded to PA request may take up to 24 hours, contact [employer] at [phone].

## 2017-10-31 NOTE — Telephone Encounter (Signed)
Pt has been approved for Entresto 49-51 mg tablet.

## 2017-10-31 NOTE — Telephone Encounter (Signed)
No I do not think this would change the class.

## 2017-10-31 NOTE — Telephone Encounter (Signed)
Pt approved until 12/22/2038.

## 2017-10-31 NOTE — Telephone Encounter (Signed)
Noted  

## 2017-10-31 NOTE — Progress Notes (Signed)
Subjective:   Howard [name] is a 83 y.o. male who presents for Medicare Annual/Subsequent preventive examination.  Review of Systems:  N/A  Cardiac Risk Factors include: advanced age (>7men, >54 women);diabetes mellitus;dyslipidemia;hypertension;male gender     Objective:    Vitals: BP 136/64 (BP Location: Left Arm)   Pulse 64   Temp 98.5 F (36.9 C) (Oral)   Ht 5\' 9"  (1.753 m)   Wt 193 lb 12.8 oz (87.9 kg)   BMI 28.62 kg/m   Body mass index is 28.62 kg/m.  Tobacco Social History   Tobacco Use  Smoking Status Former Smoker  . Years: 4.00  Smokeless Tobacco Never Used  Tobacco Comment   Quit 2011     Counseling given: Not Answered Comment: Quit 2011   Past Medical History:  Diagnosis Date  . Arthritis   . Bell palsy   . Cancer [facility])    prostate and skin  . Chronic combined systolic and diastolic CHF, NYHA class 1 ([place])    a. 07/2014 Echo: EF 35-40%, Gr 1 DD.  Marland Kitchen Complete heart block ([place])    a. 11/2010 s/p SJM 2210 Accent DC PPM, ser# [id number].  . Depression   . Diabetes mellitus without complication ([place])   . Fall 11-10-14  . GERD (gastroesophageal reflux disease)   . History of prostate cancer   . Hyperlipidemia   . Hypertension   . LBBB (left bundle branch block)   . Left-sided Bell's palsy   . Lung cancer ([place]) 2016  . NICM (nonischemic cardiomyopathy) ([place])    a. 07/2014 Echo: EF 35-40%, mid-apicalanteroseptal DK, Gr 1 DD, mild-mod dil LA.  . Non-obstructive CAD    a. 07/2014 Abnl MV;  b. 08/2014 Cath: LM nl, LAD 30p, RI 40p, LCX nl, OM1 40, RCA dominant 30p, 70d-->Med Rx.  Marland Kitchen Poor balance   . Presence of permanent cardiac pacemaker   . Sleep apnea    a. cpap  . Vertigo   . WPW (Wolff-Parkinson-White syndrome)    a. S/P RFCA 1991.   Past Surgical History:  Procedure Laterality Date  . BACK SURGERY     2011  . CARDIAC CATHETERIZATION  08/26/2014   Single vessel obstructive CAD  . CARPAL TUNNEL RELEASE  04-04-15   [facility]  . CATARACT EXTRACTION   07-31-11 and 09-18-11  . Catheter ablation  1991   for WPW  . cervical fusion    . CHOLECYSTECTOMY  09-07-14  . HAND SURGERY     right 1993; left 2005  . HERNIA REPAIR  1955  . INSERT / REPLACE / REMOVE PACEMAKER    . JOINT REPLACEMENT Left 2013   knee  . JOINT REPLACEMENT Right 2004   knee  . KNEE SURGERY     left knee 1991 and 1992; right knee 1995  . LUNG BIOPSY Right 2016   Dr [name]  . PACEMAKER INSERTION     PPM-- St Jude 11/30/10 by [name]  . PROSTATE SURGERY     cancer--1998, prostatectomy  . REPLACEMENT TOTAL KNEE     2004  . ruptured disc     1962 and 1998  . TRIGGER FINGER RELEASE  01-24-15   Family History  Problem Relation Age of Onset  . Heart attack Mother   . Hyperlipidemia Mother   . CAD Unknown   . Prostate cancer Neg Hx    Social History   Substance and Sexual Activity  Sexual Activity Not on file    Outpatient Encounter Medications as  of 10/31/2017  Medication Sig  . aspirin 81 MG tablet Take 81 mg by mouth daily.    . Cholecalciferol (VITAMIN D3) 1000 UNITS CAPS Take 1,000 Units by mouth daily.   . hydrochlorothiazide (HYDRODIURIL) 25 MG tablet TAKE 1 TABLET DAILY  . metFORMIN (GLUCOPHAGE) 500 MG tablet TAKE 1 TABLET TWICE A DAY  WITH MEALS  . metoprolol succinate (TOPROL-XL) 25 MG 24 hr tablet Take 1 tablet (25 mg total) by mouth daily.  . Multiple Vitamin (MULTIVITAMIN) capsule Take 1 capsule by mouth daily.    Marland Kitchen omeprazole (PRILOSEC) 40 MG capsule TAKE 1 CAPSULE TWICE DAILY (Patient taking differently: TAKE 1 CAPSULE ONCE DAILY)  . psyllium (METAMUCIL) 58.6 % powder Take 1 packet by mouth 3 (three) times daily.  . ranitidine (ZANTAC) 300 MG capsule Take 300 mg by mouth every evening.  . sacubitril-valsartan (ENTRESTO) 49-51 MG Take 1 tablet 2 (two) times daily by mouth.  . simvastatin (ZOCOR) 40 MG tablet TAKE 1 TABLET AT BEDTIME  . [DISCONTINUED] sacubitril-valsartan (ENTRESTO) 49-51 MG Take 1 tablet 2 (two) times daily by mouth.  . [DISCONTINUED]  simvastatin (ZOCOR) 40 MG tablet Take 1 tablet (40 mg total) by mouth at bedtime.   No facility-administered encounter medications on file as of 10/31/2017.     Activities of Daily Living In your present state of health, do you have any difficulty performing the following activities: 10/31/2017  Hearing? Y  Comment wears bilateral hearing aids  Vision? N  Difficulty concentrating or making decisions? N  Walking or climbing stairs? Y  Comment due to back pain  Dressing or bathing? N  Doing errands, shopping? N  Preparing Food and eating ? N  Using the Toilet? Y  Comment wears protection  In the past six months, have you accidently leaked urine? N  Do you have problems with loss of bowel control? N  Managing your Medications? N  Managing your Finances? N  Housekeeping or managing your Housekeeping? N  Some recent data might be hidden    Patient Care Team: Chrismon, [name], PA as PCP - General (Family Medicine) [name] Kathlene November, MD as Consulting Physician (Cardiology) [name], MD as Consulting Physician (Cardiology) Dasher, [name], MD as Consulting Physician (Dermatology) Idelle Crouch [name], Student-RN as Student Nurse (Student) Birder Robson, MD as Referring Physician (Ophthalmology) [name], MD as Consulting Physician (Vascular Surgery)   Assessment:     Exercise Activities and Dietary recommendations Current Exercise Habits: Structured exercise class(Gym @ [facility]), Type of exercise: Other - see comments(newstep machine), Time (Minutes): 60, Frequency (Times/Week): 3, Weekly Exercise (Minutes/Week): 180, Intensity: Mild, Exercise limited by: orthopedic condition(s)  Goals    None     Fall Risk Fall Risk  10/31/2017 04/23/2017 10/28/2016 10/24/2016 04/18/2016  Falls in the past year? No Yes No No No  Number falls in past yr: - 2 or more - - -  Injury with Fall? - - - - -  Risk Factor Category  - High Fall Risk - - -  Risk for fall due to : -  History of fall(s);Impaired balance/gait - Other (Comment);History of fall(s) -  Risk for fall due to: Comment - - - Hx of Vertigo -  Follow up - Falls evaluation completed;Education provided - - -   Depression Screen PHQ 2/9 Scores 10/31/2017 04/23/2017 10/28/2016 10/24/2016  PHQ - 2 Score 0 0 0 0    Cognitive Function     6CIT Screen 10/31/2017 10/24/2016  What Year? 0 points  0 points  What month? 0 points 0 points  What time? 0 points 0 points  Count back from 20 0 points 0 points  Months in reverse 2 points 0 points  Repeat phrase 2 points 6 points  Total Score 4 6    Immunization History  Administered Date(s) Administered  . Influenza, High Dose Seasonal PF 09/15/2015, 09/30/2017  . Influenza-Unspecified 08/23/2013, 09/15/2015, 09/12/2016  . Pneumococcal Conjugate-13 04/18/2016  . Pneumococcal Polysaccharide-23 09/30/2017  . Tdap 09/07/2011  . Zoster 11/22/2013   Screening Tests Health Maintenance  Topic Date Due  . HEMOGLOBIN A1C  03/31/2018  . OPHTHALMOLOGY EXAM  04/02/2018  . FOOT EXAM  09/30/2018  . URINE MICROALBUMIN  09/30/2018  . TETANUS/TDAP  09/06/2021  . INFLUENZA VACCINE  Completed  . PNA vac Low Risk Adult  Completed      Plan:  I have personally reviewed and addressed the Medicare Annual Wellness questionnaire and have noted the following in the patient's chart:  A. Medical and social history B. Use of alcohol, tobacco or illicit drugs  C. Current medications and supplements D. Functional ability and status E.  Nutritional status F.  Physical activity G. Advance directives H. List of other physicians I.  Hospitalizations, surgeries, and ER visits in previous 12 months J.  [place] such as hearing and vision if needed, cognitive and depression L. Referrals and appointments - none  In addition, I have reviewed and discussed with patient certain preventive protocols, quality metrics, and best practice recommendations. A written  personalized care plan for preventive services as well as general preventive health recommendations were provided to patient.  See attached scanned questionnaire for additional information.   Signed,  Fabio Neighbors, LPN Nurse Health Advisor   MD Recommendations: None.  Reviewed the Nurse Health Advisor's note and was available for consultation. Agree with documentation and plan.

## 2017-10-31 NOTE — Patient Instructions (Signed)
Mr. Howard [name] , Thank you for taking time to come for your Medicare Wellness Visit. I appreciate your ongoing commitment to your health goals. Please review the following plan we discussed and let me know if I can assist you in the future.   Screening recommendations/referrals: Colonoscopy: Up to date Recommended yearly ophthalmology/optometry visit for glaucoma screening and checkup Recommended yearly dental visit for hygiene and checkup  Vaccinations: Influenza vaccine: complete Pneumococcal vaccine: completed series Tdap vaccine: complete Shingles vaccine: completed in 2014  Advanced directives: Please bring a copy of your POA (Power of [place]) and/or Living Will to your next appointment.   Conditions/risks identified:  Recommend increasing water intake to 4-6 glasses a day.  Next appointment: 01/02/18  Preventive Care 65 Years and Older, Male Preventive care refers to lifestyle choices and visits with your health care provider that can promote health and wellness. What does preventive care include?  A yearly physical exam. This is also called an annual well check.  Dental exams once or twice a year.  Routine eye exams. Ask your health care provider how often you should have your eyes checked.  Personal lifestyle choices, including:  Daily care of your teeth and gums.  Regular physical activity.  Eating a healthy diet.  Avoiding tobacco and drug use.  Limiting alcohol use.  Practicing safe sex.  Taking low doses of aspirin every day.  Taking vitamin and mineral supplements as recommended by your health care provider. What happens during an annual well check? The services and screenings done by your health care provider during your annual well check will depend on your age, overall health, lifestyle risk factors, and family history of disease. Counseling  Your health care provider may ask you questions about your:  Alcohol use.  Tobacco use.  Drug  use.  Emotional well-being.  Home and relationship well-being.  Sexual activity.  Eating habits.  History of falls.  Memory and ability to understand (cognition).  Work and work Statistician. Screening  You may have the following tests or measurements:  Height, weight, and BMI.  Blood pressure.  Lipid and cholesterol levels. These may be checked every 5 years, or more frequently if you are over 97 years old.  Skin check.  Lung cancer screening. You may have this screening every year starting at age 70 if you have a 30-pack-year history of smoking and currently smoke or have quit within the past 15 years.  Fecal occult blood test (FOBT) of the stool. You may have this test every year starting at age 92.  Flexible sigmoidoscopy or colonoscopy. You may have a sigmoidoscopy every 5 years or a colonoscopy every 10 years starting at age 50.  Prostate cancer screening. Recommendations will vary depending on your family history and other risks.  Hepatitis C blood test.  Hepatitis B blood test.  Sexually transmitted disease (STD) testing.  Diabetes screening. This is done by checking your blood sugar (glucose) after you have not eaten for a while (fasting). You may have this done every 1-3 years.  Abdominal aortic aneurysm (AAA) screening. You may need this if you are a current or former smoker.  Osteoporosis. You may be screened starting at age 15 if you are at high risk. Talk with your health care provider about your test results, treatment options, and if necessary, the need for more tests. Vaccines  Your health care provider may recommend certain vaccines, such as:  Influenza vaccine. This is recommended every year.  Tetanus, diphtheria, and acellular pertussis (Tdap,  Td) vaccine. You may need a Td booster every 10 years.  Zoster vaccine. You may need this after age 63.  Pneumococcal 13-valent conjugate (PCV13) vaccine. One dose is recommended after age  25.  Pneumococcal polysaccharide (PPSV23) vaccine. One dose is recommended after age 35. Talk to your health care provider about which screenings and vaccines you need and how often you need them. This information is not intended to replace advice given to you by your health care provider. Make sure you discuss any questions you have with your health care provider. Document Released: 01/05/2016 Document Revised: 08/28/2016 Document Reviewed: 10/10/2015 Elsevier Interactive Patient Education  2017 [place] Prevention in the Home Falls can cause injuries. They can happen to people of all ages. There are many things you can do to make your home safe and to help prevent falls. What can I do on the outside of my home?  Regularly fix the edges of walkways and driveways and fix any cracks.  Remove anything that might make you trip as you walk through a door, such as a raised step or threshold.  Trim any bushes or trees on the path to your home.  Use bright outdoor lighting.  Clear any walking paths of anything that might make someone trip, such as rocks or tools.  Regularly check to see if handrails are loose or broken. Make sure that both sides of any steps have handrails.  Any raised decks and porches should have guardrails on the edges.  Have any leaves, snow, or ice cleared regularly.  Use sand or salt on walking paths during winter.  Clean up any spills in your garage right away. This includes oil or grease spills. What can I do in the bathroom?  Use night lights.  Install grab bars by the toilet and in the tub and shower. Do not use towel bars as grab bars.  Use non-skid mats or decals in the tub or shower.  If you need to sit down in the shower, use a plastic, non-slip stool.  Keep the floor dry. Clean up any water that spills on the floor as soon as it happens.  Remove soap buildup in the tub or shower regularly.  Attach bath mats securely with double-sided  non-slip rug tape.  Do not have throw rugs and other things on the floor that can make you trip. What can I do in the bedroom?  Use night lights.  Make sure that you have a light by your bed that is easy to reach.  Do not use any sheets or blankets that are too big for your bed. They should not hang down onto the floor.  Have a firm chair that has side arms. You can use this for support while you get dressed.  Do not have throw rugs and other things on the floor that can make you trip. What can I do in the kitchen?  Clean up any spills right away.  Avoid walking on wet floors.  Keep items that you use a lot in easy-to-reach places.  If you need to reach something above you, use a strong step stool that has a grab bar.  Keep electrical cords out of the way.  Do not use floor polish or wax that makes floors slippery. If you must use wax, use non-skid floor wax.  Do not have throw rugs and other things on the floor that can make you trip. What can I do with my stairs?  Do not  leave any items on the stairs.  Make sure that there are handrails on both sides of the stairs and use them. Fix handrails that are broken or loose. Make sure that handrails are as long as the stairways.  Check any carpeting to make sure that it is firmly attached to the stairs. Fix any carpet that is loose or worn.  Avoid having throw rugs at the top or bottom of the stairs. If you do have throw rugs, attach them to the floor with carpet tape.  Make sure that you have a light switch at the top of the stairs and the bottom of the stairs. If you do not have them, ask someone to add them for you. What else can I do to help prevent falls?  Wear shoes that:  Do not have high heels.  Have rubber bottoms.  Are comfortable and fit you well.  Are closed at the toe. Do not wear sandals.  If you use a stepladder:  Make sure that it is fully opened. Do not climb a closed stepladder.  Make sure that both  sides of the stepladder are locked into place.  Ask someone to hold it for you, if possible.  Clearly mark and make sure that you can see:  Any grab bars or handrails.  First and last steps.  Where the edge of each step is.  Use tools that help you move around (mobility aids) if they are needed. These include:  Canes.  Walkers.  Scooters.  Crutches.  Turn on the lights when you go into a dark area. Replace any light bulbs as soon as they burn out.  Set up your furniture so you have a clear path. Avoid moving your furniture around.  If any of your floors are uneven, fix them.  If there are any pets around you, be aware of where they are.  Review your medicines with your doctor. Some medicines can make you feel dizzy. This can increase your chance of falling. Ask your doctor what other things that you can do to help prevent falls. This information is not intended to replace advice given to you by your health care provider. Make sure you discuss any questions you have with your health care provider. Document Released: 10/05/2009 Document Revised: 05/16/2016 Document Reviewed: 01/13/2015 Elsevier Interactive Patient Education  2017 [employer].

## 2017-11-04 DIAGNOSIS — M48062 Spinal stenosis, lumbar region with neurogenic claudication: Secondary | ICD-10-CM | POA: Diagnosis not present

## 2017-11-04 DIAGNOSIS — I10 Essential (primary) hypertension: Secondary | ICD-10-CM | POA: Diagnosis not present

## 2017-11-23 NOTE — Progress Notes (Signed)
Cardiology Office Note  Date:  11/25/2017   ID:  Howard [name], DOB October 18, 1931, MRN [id number]  PCP:  Margo Common, PA   Chief Complaint  Patient presents with  . other    OD 12 month f/u c/o Rx Entresto being held until 11/29/17 and will not be able to get refill due to hold. Meds reviewed verbally with pt.    HPI:  Mr. Caughlin is a 83 year old-year-old gentleman with a hx of  DM II complete heart block, status post pacemaker in 2011,  nonischemic cardiomyopathy,  ejection fraction 30-35%   coronary artery disease, 70% distal RCA disease, 30 and 40% lesions in his ramus, LAD  admission to the hospital 09/05/2014  with acute cholecystitis, ileus.  Cardiac catheterization at that time for positive stress test . Lung CA  He presents for follow-up of his CAD  Chronic back pain, Having spinal injections, Not helping Worse with hills Hx of back surgery x 6 Legs lock, particularly on the left side  Denies any chest pain, no problems with Entresto Actually feels better on the medication, more energy, doing physical therapy at the [facility] Exercising several days per week  No chest pain or shortness of breath concerning for angina  Previous CT chest 03/2016 Decreasing size of pathology-proven adenocarcinoma of right upper lobe, with associated radiation treatment effects of the lung.  EKG personally reviewed by myself on todays visit Shows paced rhythm rate 60 bpm  Other past medical history reviewed He was in the hospital November 2016 for abscess on his back, had surgery Nodules found in the right lung, biopsy documenting adenocarcinoma 1.3 cm, treated with radiation  Lab work reviewed with him showing total cholesterol 133, hemoglobin A1c 7.1  Previous chest CT scan showing moderate coronary calcifications in the LAD and diagonal branches, ramus Also with at least mild diffuse aortic atherosclerosis, mild carotid disease  vertigo February 2016  In early  September 2015 he was seen in the [place] office with symptoms of chest pain and shortness of breath,  he had a Abnormal Myoview  cardiac catheterization showing details above Echocardiogram showing ejection fraction 35-40%  After discharge she continued to have similar symptoms of epigastric discomfort radiating through to his back Admitted to the hospital for cholecystectomy  PMH:   has a past medical history of Arthritis, Bell palsy, Cancer ([place]), Chronic combined systolic and diastolic CHF, NYHA class 1 ([place]), Complete heart block ([place]), Depression, Diabetes mellitus without complication ([place]), Fall (11-10-14), GERD (gastroesophageal reflux disease), History of prostate cancer, Hyperlipidemia, Hypertension, LBBB (left bundle branch block), Left-sided Bell's palsy, Lung cancer ([place]) (2016), NICM (nonischemic cardiomyopathy) ([place]), Non-obstructive CAD, Poor balance, Presence of permanent cardiac pacemaker, Sleep apnea, Vertigo, and WPW (Wolff-Parkinson-White syndrome).  PSH:    Past Surgical History:  Procedure Laterality Date  . APPLICATION OF WOUND VAC Left 06/07/2015   Procedure: APPLICATION OF WOUND VAC;  Surgeon: Robert Bellow, MD;  Location: [facility] ORS;  Service: General;  Laterality: Left;  left upper back  . BACK SURGERY     2011  . CARDIAC CATHETERIZATION  08/26/2014   Single vessel obstructive CAD  . CARPAL TUNNEL RELEASE  04-04-15   [facility]  . CATARACT EXTRACTION  07-31-11 and 09-18-11  . Catheter ablation  1991   for WPW  . cervical fusion    . CHOLECYSTECTOMY  09-07-14  . HAND SURGERY     right 1993; left 2005  . HERNIA REPAIR  1955  . INSERT / REPLACE /  REMOVE PACEMAKER    . JOINT REPLACEMENT Left 2013   knee  . JOINT REPLACEMENT Right 2004   knee  . KNEE SURGERY     left knee 1991 and 1992; right knee 1995  . LEFT HEART CATHETERIZATION WITH CORONARY ANGIOGRAM N/A 08/26/2014   Procedure: LEFT HEART CATHETERIZATION WITH CORONARY ANGIOGRAM;  Surgeon: [name], MD;   Location: [facility] CATH LAB;  Service: Cardiovascular;  Laterality: N/A;  . LUMBAR LAMINECTOMY/DECOMPRESSION MICRODISCECTOMY N/A 06/07/2014   Procedure: LUMBAR FOUR TO FIVE LUMBAR LAMINECTOMY/DECOMPRESSION MICRODISCECTOMY 1 LEVEL;  Surgeon: [name], MD;  Location: [facility] NEURO ORS;  Service: Neurosurgery;  Laterality: N/A;  . LUNG BIOPSY Right 2016   Dr [name]  . PACEMAKER INSERTION     PPM-- St Jude 11/30/10 by [name]  . PROSTATE SURGERY     cancer--1998, prostatectomy  . REPLACEMENT TOTAL KNEE     2004  . ruptured disc     1962 and 1998  . TRIGGER FINGER RELEASE  01-24-15  . WOUND DEBRIDEMENT Left 06/07/2015   Procedure: DEBRIDEMENT WOUND;  Surgeon: Robert Bellow, MD;  Location: [facility] ORS;  Service: General;  Laterality: Left;  left upper back    Current Outpatient Medications  Medication Sig Dispense Refill  . aspirin 81 MG tablet Take 81 mg by mouth daily.      . Cholecalciferol (VITAMIN D3) 1000 UNITS CAPS Take 1,000 Units by mouth daily.     . hydrochlorothiazide (HYDRODIURIL) 25 MG tablet TAKE 1 TABLET DAILY 90 tablet 3  . metFORMIN (GLUCOPHAGE) 500 MG tablet TAKE 1 TABLET TWICE A DAY  WITH MEALS 180 tablet 3  . metoprolol succinate (TOPROL-XL) 25 MG 24 hr tablet Take 1 tablet (25 mg total) by mouth daily. 90 tablet 3  . Multiple Vitamin (MULTIVITAMIN) capsule Take 1 capsule by mouth daily.      Marland Kitchen omeprazole (PRILOSEC) 40 MG capsule TAKE 1 CAPSULE TWICE DAILY (Patient taking differently: TAKE 1 CAPSULE ONCE DAILY) 180 capsule 3  . psyllium (METAMUCIL) 58.6 % powder Take 1 packet by mouth 3 (three) times daily.    . ranitidine (ZANTAC) 300 MG capsule Take 300 mg by mouth every evening.    . simvastatin (ZOCOR) 40 MG tablet TAKE 1 TABLET AT BEDTIME 90 tablet 0  . sacubitril-valsartan (ENTRESTO) 49-51 MG Take 1 tablet by mouth 2 (two) times daily. 180 tablet 3   No current facility-administered medications for this visit.      Allergies:   Sulfa antibiotics and Sulfonamide derivatives    Social History:  The patient  reports that he has quit smoking. He quit after 4.00 years of use. he has never used smokeless tobacco. He reports that he does not drink alcohol or use drugs.   Family History:   family history includes CAD in his unknown relative; Heart attack in his mother; Hyperlipidemia in his mother.    Review of Systems: Review of Systems  Constitutional: Negative.   Respiratory: Negative.   Cardiovascular: Negative.   Gastrointestinal: Negative.   Musculoskeletal: Negative.   Neurological: Negative.   Psychiatric/Behavioral: Negative.   All other systems reviewed and are negative.    PHYSICAL EXAM: VS:  BP 134/80 (BP Location: Left Arm, Patient Position: Sitting, Cuff Size: Normal)   Pulse 60   Ht 5' 9.5" (1.765 m)   Wt 191 lb 12 oz (87 kg)   BMI 27.91 kg/m  , BMI Body mass index is 27.91 kg/m. GEN: Well nourished, well developed, in no acute distress  HEENT: normal  Neck: no JVD, carotid bruits, or masses Cardiac: RRR; no murmurs, rubs, or gallops,no edema  Respiratory:  clear to auscultation bilaterally, normal work of breathing GI: soft, nontender, nondistended, + BS MS: no deformity or atrophy  Skin: warm and dry, no rash Neuro:  Strength and sensation are intact Psych: euthymic mood, full affect    Recent Labs: 09/30/2017: ALT 20; BUN 14; Creat 0.81; Hemoglobin 14.2; Platelets 237; Potassium 4.3; Sodium 135; TSH 1.09    Lipid Panel Lab Results  Component Value Date   CHOL 135 09/30/2017   HDL 48 09/30/2017   LDLCALC 70 02/18/2017   TRIG 154 (H) 09/30/2017      Wt Readings from Last 3 Encounters:  11/25/17 191 lb 12 oz (87 kg)  10/31/17 193 lb 12.8 oz (87.9 kg)  10/21/17 191 lb 9.6 oz (86.9 kg)       ASSESSMENT AND PLAN:  Coronary artery disease involving native coronary artery of native heart without angina pectoris - Plan: EKG 12-Lead Currently with no symptoms of angina. No further workup at this time. Continue current  medication regimen.  Stable  Complete heart block (HCC) - Plan: EKG 12-Lead S/p pacemaker Followed by EP  Essential hypertension - Plan: EKG 12-Lead Blood pressure is well controlled on today's visit. No changes made to the medications. We will continue Entresto at current dose Recommended he monitor blood pressure and if this continues to run systolic pressure 881 we would increase the dose BMP today  Mixed hyperlipidemia Recommended he watch his carbohydrates, needs small weight loss  On simvastatin with LDL at goal  Nonischemic cardiomyopathy [facility]) Denies any symptoms concerning for CHF Continue beta blocker, Entresto  Pacemaker-St.Jude Followed by EP  Malignant neoplasm of middle lobe of right lung [facility]) Previous CT scan March 2017  Stable   Total encounter time more than 25 minutes  Greater than 50% was spent in counseling and coordination of care with the patient   Disposition:   F/U  6 months   Orders Placed This Encounter  Procedures  . EKG 12-Lead     Signed, Esmond Plants, M.D., Ph.D. 11/25/2017  [place], [place]

## 2017-11-25 ENCOUNTER — Telehealth: Payer: Self-pay | Admitting: Internal Medicine

## 2017-11-25 ENCOUNTER — Ambulatory Visit (INDEPENDENT_AMBULATORY_CARE_PROVIDER_SITE_OTHER): Payer: Medicare Other | Admitting: Cardiovascular Disease

## 2017-11-25 ENCOUNTER — Other Ambulatory Visit: Payer: Self-pay

## 2017-11-25 ENCOUNTER — Encounter: Payer: Self-pay | Admitting: Cardiovascular Disease

## 2017-11-25 VITALS — BP 134/80 | HR 60 | Ht 69.5 in | Wt 191.8 lb

## 2017-11-25 DIAGNOSIS — G4733 Obstructive sleep apnea (adult) (pediatric): Secondary | ICD-10-CM | POA: Diagnosis not present

## 2017-11-25 DIAGNOSIS — I25118 Atherosclerotic heart disease of native coronary artery with other forms of angina pectoris: Secondary | ICD-10-CM | POA: Diagnosis not present

## 2017-11-25 DIAGNOSIS — I428 Other cardiomyopathies: Secondary | ICD-10-CM | POA: Diagnosis not present

## 2017-11-25 DIAGNOSIS — E119 Type 2 diabetes mellitus without complications: Secondary | ICD-10-CM

## 2017-11-25 DIAGNOSIS — I2510 Atherosclerotic heart disease of native coronary artery without angina pectoris: Secondary | ICD-10-CM | POA: Diagnosis not present

## 2017-11-25 DIAGNOSIS — I10 Essential (primary) hypertension: Secondary | ICD-10-CM

## 2017-11-25 DIAGNOSIS — E782 Mixed hyperlipidemia: Secondary | ICD-10-CM

## 2017-11-25 DIAGNOSIS — I442 Atrioventricular block, complete: Secondary | ICD-10-CM

## 2017-11-25 DIAGNOSIS — I209 Angina pectoris, unspecified: Secondary | ICD-10-CM

## 2017-11-25 MED ORDER — SACUBITRIL-VALSARTAN 49-51 MG PO TABS: 1.0000 | ORAL_TABLET | Freq: Two times a day (BID) | ORAL | 3 refills | Status: DC

## 2017-11-25 NOTE — Patient Instructions (Addendum)
Please monitor blood pressure Cal the office with range  Medication Instructions:   No medication changes made  Labwork:  BMP today  Testing/Procedures:  No further testing at this time   Follow-Up: It was a pleasure seeing you in the office today. Please call us if you have new issues that need to be addressed before your next appt.  [phone]  Your physician wants you to follow-up in: 6 months.  You will receive a reminder letter in the mail two months in advance. If you don't receive a letter, please call our office to schedule the follow-up appointment.  If you need a refill on your cardiac medications before your next appointment, please call your pharmacy.

## 2017-11-25 NOTE — Telephone Encounter (Signed)
Patient is in office for Dr [name] visit Patient wife is concerned about the [facility] medication They were given a coupon for the 30 day supply but the pharmacy will not refill new until the 8th and they are worried medication will run out before it will be refilled  Please call to discuss

## 2017-11-25 NOTE — Telephone Encounter (Signed)
Provided sample entresto until [facility] medication.  Medication Samples have been provided to the patient.  Drug name: Delene Loll        Strength: 49-51mg         Qty: 2 bottles  LOT: [id number]  Exp.Date: 1/19  Dosing instructions: Take one tablet twice daily  The patient has been instructed regarding the correct time, dose, and frequency of taking this medication, including desired effects and most common side effects.   Georgiana Shore 11:41 AM 11/25/2017

## 2017-11-25 NOTE — Telephone Encounter (Signed)
Will review this information with patient and his wife during office visit today. Will confirm pharmacy to contact regarding medication and refills.

## 2017-11-26 LAB — BASIC METABOLIC PANEL
BUN/Creatinine Ratio: 19 (ref 10–24)
BUN: 14 mg/dL (ref 8–27)
CALCIUM: 9.4 mg/dL (ref 8.6–10.2)
CHLORIDE: 96 mmol/L (ref 96–106)
CO2: 20 mmol/L (ref 20–29)
Creatinine, Ser: 0.73 mg/dL — ABNORMAL LOW (ref 0.76–1.27)
GFR calc non Af Amer: 85 mL/min/{1.73_m2} (ref >59)
GFR, EST AFRICAN AMERICAN: 98 mL/min/{1.73_m2} (ref >59)
GLUCOSE: 137 mg/dL — AB (ref 65–99)
POTASSIUM: 4.3 mmol/L (ref 3.5–5.2)
Sodium: 135 mmol/L (ref 134–144)

## 2017-12-05 ENCOUNTER — Telehealth: Payer: Self-pay | Admitting: *Deleted

## 2017-12-05 DIAGNOSIS — Z6828 Body mass index (BMI) 28.0-28.9, adult: Secondary | ICD-10-CM | POA: Diagnosis not present

## 2017-12-05 DIAGNOSIS — M48062 Spinal stenosis, lumbar region with neurogenic claudication: Secondary | ICD-10-CM | POA: Diagnosis not present

## 2017-12-05 DIAGNOSIS — I10 Essential (primary) hypertension: Secondary | ICD-10-CM | POA: Diagnosis not present

## 2017-12-05 NOTE — Telephone Encounter (Signed)
-----   Message from [name], MD sent at 12/04/2017 10:36 PM EST ----- Regarding: RE: referral to [facility] to place referral TG  ----- Message ----- From: [name], RN Sent: 12/04/2017   4:19 PM To: [name], MD Subject: referral to CAardiac Rehab                     Howard [name] is in the [employer] class and mentioned to the staff he has started [facility] for Heart Failure.  He would be an excellent candidate for Cardiac Rehab.  Please place a referral for him if you think Cardiac Rehab  would be a good fit for him at this time.  Thanks, [name]

## 2017-12-08 ENCOUNTER — Telehealth: Payer: Self-pay | Admitting: Family Medicine

## 2017-12-08 DIAGNOSIS — R42 Dizziness and giddiness: Secondary | ICD-10-CM

## 2017-12-08 DIAGNOSIS — H933X9 Disorders of unspecified acoustic nerve: Secondary | ICD-10-CM

## 2017-12-08 NOTE — Telephone Encounter (Signed)
[facility] is calling wanting to get a referral for Mr. Howard [name] to have vestibur rehab for dizziness,  There call bacj is [phone]  He has an appt tomorrpw  Thanks teri

## 2017-12-08 NOTE — Telephone Encounter (Signed)
Please order referral for vestibular disease with dizziness.

## 2017-12-08 NOTE — Telephone Encounter (Signed)
Ok to order referral? Please advise. Thanks!

## 2017-12-09 ENCOUNTER — Encounter: Payer: Self-pay | Admitting: Physical Therapy

## 2017-12-09 ENCOUNTER — Ambulatory Visit: Payer: Medicare Other | Attending: Family Medicine | Admitting: Physical Therapy

## 2017-12-09 ENCOUNTER — Other Ambulatory Visit: Payer: Self-pay

## 2017-12-09 DIAGNOSIS — R42 Dizziness and giddiness: Secondary | ICD-10-CM | POA: Insufficient documentation

## 2017-12-09 DIAGNOSIS — R262 Difficulty in walking, not elsewhere classified: Secondary | ICD-10-CM

## 2017-12-09 DIAGNOSIS — R296 Repeated falls: Secondary | ICD-10-CM | POA: Insufficient documentation

## 2017-12-09 NOTE — Therapy (Signed)
[place] MAIN [facility] SERVICES [address] [place], [place], [address] Phone: [phone]   Fax:  [phone]  Physical Therapy Evaluation  Patient Details  Name: Howard [name] MRN: [id number] Date of Birth: 1931-12-02 Referring Provider: Dr. Natale Milch   Encounter Date: 12/09/2017  PT End of Session - 12/09/17 0808    Visit Number  1    Number of Visits  17    Date for PT Re-Evaluation  02/03/18    Authorization Type  Needs G codes    Authorization Time Period  1/10    PT Start Time  0800    PT Stop Time  0857    PT Time Calculation (min)  57 min    Equipment Utilized During Treatment  Gait belt    Activity Tolerance  Patient tolerated treatment well but did have episodes of vertigo requiring rest break    Behavior During Therapy  [facility] for tasks assessed/performed       Past Medical History:  Diagnosis Date  . Arthritis   . Bell palsy   . Cancer [facility])    prostate and skin  . Chronic combined systolic and diastolic CHF, NYHA class 1 ([place])    a. 07/2014 Echo: EF 35-40%, Gr 1 DD.  Marland Kitchen Complete heart block ([place])    a. 11/2010 s/p SJM 2210 [employer] DC PPM, ser# [id number].  . Depression   . Diabetes mellitus without complication ([place])   . Fall 11-10-14  . GERD (gastroesophageal reflux disease)   . History of prostate cancer   . Hyperlipidemia   . Hypertension   . LBBB (left bundle branch block)   . Left-sided Bell's palsy   . Lung cancer ([place]) 2016  . NICM (nonischemic cardiomyopathy) ([place])    a. 07/2014 Echo: EF 35-40%, mid-apicalanteroseptal DK, Gr 1 DD, mild-mod dil LA.  . Non-obstructive CAD    a. 07/2014 Abnl MV;  b. 08/2014 Cath: LM nl, LAD 30p, RI 40p, LCX nl, OM1 40, RCA dominant 30p, 70d-->Med Rx.  Marland Kitchen Poor balance   . Presence of permanent cardiac pacemaker   . Sleep apnea    a. cpap  . Vertigo   . WPW (Wolff-Parkinson-White syndrome)    a. S/P RFCA 1991.    Past Surgical History:  Procedure Laterality Date  . APPLICATION  OF WOUND VAC Left 06/07/2015   Procedure: APPLICATION OF WOUND VAC;  Surgeon: Robert Bellow, MD;  Location: [facility] ORS;  Service: General;  Laterality: Left;  left upper back  . BACK SURGERY     2011  . CARDIAC CATHETERIZATION  08/26/2014   Single vessel obstructive CAD  . CARPAL TUNNEL RELEASE  04-04-15   [facility]  . CATARACT EXTRACTION  07-31-11 and 09-18-11  . Catheter ablation  1991   for WPW  . cervical fusion    . CHOLECYSTECTOMY  09-07-14  . HAND SURGERY     right 1993; left 2005  . HERNIA REPAIR  1955  . INSERT / REPLACE / REMOVE PACEMAKER    . JOINT REPLACEMENT Left 2013   knee  . JOINT REPLACEMENT Right 2004   knee  . KNEE SURGERY     left knee 1991 and 1992; right knee 1995  . LEFT HEART CATHETERIZATION WITH CORONARY ANGIOGRAM N/A 08/26/2014   Procedure: LEFT HEART CATHETERIZATION WITH CORONARY ANGIOGRAM;  Surgeon: [name], MD;  Location: [facility] CATH LAB;  Service: Cardiovascular;  Laterality: N/A;  . LUMBAR LAMINECTOMY/DECOMPRESSION MICRODISCECTOMY N/A 06/07/2014   Procedure:  LUMBAR FOUR TO FIVE LUMBAR LAMINECTOMY/DECOMPRESSION MICRODISCECTOMY 1 LEVEL;  Surgeon: [name], MD;  Location: [place] NEURO ORS;  Service: Neurosurgery;  Laterality: N/A;  . LUNG BIOPSY Right 2016   Dr [name]  . PACEMAKER INSERTION     PPM-- St Jude 11/30/10 by [name]  . PROSTATE SURGERY     cancer--1998, prostatectomy  . REPLACEMENT TOTAL KNEE     2004  . ruptured disc     1962 and 1998  . TRIGGER FINGER RELEASE  01-24-15  . WOUND DEBRIDEMENT Left 06/07/2015   Procedure: DEBRIDEMENT WOUND;  Surgeon: Robert Bellow, MD;  Location: [facility] ORS;  Service: General;  Laterality: Left;  left upper back    There were no vitals filed for this visit.   Subjective Assessment - 12/09/17 0806    Subjective  Patient states that he has been feeling unsteady for several days. Patient states he felt very unsteady this morning but states it is improving as the day goes on.     Pertinent History  Patient know to this  Probation officer and patient has been seen several times since 2015 for repeat episodes of BPPV and balance difficulties. Patient states he was doing pretty good until yesterday when he fell off the porch backwards hitting a brick wall. Patient states he did not hit his head, but did hit his left elbow. Patient states he had been reaching over to get something and states he had an episode of vertigo. Patient states he was having trouble walking in the last few days on and off and all day yesterday. Patient states he is using a SPC because he feels unsteady currently. Patient states when he woke up today he has been unstable walking but that it has been improving as the day goes on. Patient reports that he has been getting shots in his spine for arthritic pain. Patient states he has had several episodes of his left thigh locking up, but states this has not caused him to fall.     Patient Stated Goals  Patient would like to be able to lie in prone to shoot an air rifle, be able to walk without the cane and to have less dizziness and be more steady on his feet.    Currently in Pain?  No/denies         [facility] PT Assessment - 12/09/17 1017      Assessment   Medical Diagnosis  dizziness and giddiness    Referring Provider  Dr. Natale Milch    Onset Date/Surgical Date  12/08/17    Prior Therapy  vestibular therapy at least twice in the past for BPPV      Precautions   Precautions  Fall      Restrictions   Weight Bearing Restrictions  No      Balance Screen   Has the patient fallen in the past 6 months  Yes    How many times?  1    Has the patient had a decrease in activity level because of a fear of falling?   Yes    Is the patient reluctant to leave their home because of a fear of falling?   No      Home Social worker  Private residence    Living Arrangements  Spouse/significant other    Available Help at Discharge  Family;Friend(s)    Type of [place] to enter     Home Layout  Multi-level;Laundry or work area in [employer] - single point      Prior Function   Level of [place] with community mobility without device    Vocation  Retired      Charity fundraiser Status  Within Functional Limits for tasks assessed      Standardized Balance Assessment   Standardized Balance Assessment  Dynamic Gait Index      Dynamic Gait Index   Level Surface  Mild Impairment    Change in Gait Speed  Moderate Impairment    Gait with Horizontal Head Turns  Moderate Impairment    Gait with Vertical Head Turns  Moderate Impairment    Gait and Pivot Turn  Moderate Impairment    Step Over Obstacle  Mild Impairment    Step Around Obstacles  Normal    Steps  Mild Impairment    Total Score  13    DGI comment:  used SPC         VESTIBULAR AND BALANCE EVALUATION  Onset Date: 12/08/2017  HISTORY:  Subjective history of current problem: Patient know to this Probation officer and patient has been seen several times since 2015 for repeat episodes of BPPV and balance difficulties. Patient states he was doing pretty good until yesterday when he fell off the porch backwards hitting a brick wall. Patient states he did not hit his head, but did hit his left elbow. Patient states he had been reaching over to get something and states he had an episode of vertigo. Patient states he was having trouble walking in the last few days on and off and all day yesterday. Patient states he is using a SPC because he feels unsteady currently. Patient states when he woke up today he has been unstable walking but that it has been improving as the day goes on. Patient reports that he has been getting shots in his spine for arthritic pain. Patient states he has had several episodes of his left thigh locking up, but states this has not caused him to fall.   Description of dizziness: vertigo, unsteadiness, lightheadedness, falling Frequency: started in the  last few days and the vertigo has been motion provoked Duration: 15 seconds Symptom nature: motion provoked, positional Provocative Factors: bending over Easing Factors: staying still  Progression of symptoms: (better, worse, no change since onset) History of similar episodes: yes   Falls (yes/no): yes Number of falls in past 6 months: 1  Prior Functional Level: Patient is prior independent community ambulator without AD.   Auditory complaints (tinnitus, pain, drainage): hearing aides Vision (last eye exam, diplopia, recent changes): none wears glasses   EXAMINATION  POSTURE:  Rounded shoulders  COORDINATION: Finger to Nose: deferred testing this date Past Pointing:      MUSCULOSKELETAL SCREEN: Cervical Spine ROM: Patient with decreased AROM cervical spine all planes. Patient with grossly 45 degrees AROM right and left cervical rotation and extension.    Gait: Patient arrives to clinic ambulating with SPC. Patient instructed as to proper sequencing with SPC. Patient ambulating with decreased cadence and decreased step length.  Scanning of visual environment with gait is: poor  Balance: Patient demonstrates difficulty with ambulation with head turns, narrow BOS, turning and SLS activities.   OCULOMOTOR / VESTIBULAR TESTING:  Deferred oculomotor testing this date secondary to time constraints.    BPPV TESTS:  Symptoms Duration Intensity Nystagmus  L Dix-Hallpike vertigo About 15 seconds 7/10 Few beats left upbeating  torsional nystagmus  R Dix-Hallpike denies N/A 0 none    FUNCTIONAL OUTCOME MEASURES:  Results Comments  DHI 60/100 Moderate perception of handicap; in need of intervention  ABC Scale 36.2% High Falls risk; in need of intervention  DGI 13/24 Falls risk; in need of intervention    Canalith Repositioning: Patient with limited cervical extension ROM therefore performed testing on inclined mat table.  On inverted mat table performed right Dix-Hallpike  testing and it was negative On inverted mat table performed left Dix-Hallpike testing and it was positive with left upbeating torsional nystagmus of short duration, a few beats. Patient with a few second latency and then observed several beats of nystagmus. Performed left canalith repositioning maneuver (CRT). Repeated L CRT for a total of 3 maneuvers with retesting between maneuvers. Patient reported 7/10 vertigo with first maneuver and 0/10 with last maneuver. Did not see any nystagmus on subsequent maneuvers.     PT Education - 12/09/17 0808    Education provided  Yes    Education Details  discussed plan of care and BPPV    Person(s) Educated  Patient    Methods  Explanation    Comprehension  Verbalized understanding       PT Short Term Goals - 12/09/17 1253      PT SHORT TERM GOAL #1   Title  Patient will report 50% or greater improvement in his symptoms of dizziness and imbalance with provoking motions or positions.    Time  4    Period  Weeks    Status  New    Target Date  01/06/18        PT Long Term Goals - 12/09/17 1249      PT LONG TERM GOAL #1   Title  Patient will demonstrate reduced falls risk as evidenced by Dynamic Gait Index (DGI) >19/24 by 06/17/2017.    Baseline  scored 13/24 on12/18/2018    Time  8    Period  Weeks    Status  New    Target Date  02/03/18      PT LONG TERM GOAL #2   Title  Patient will reduce perceived disability to low levels as indicated by <40 on Dizziness Handicap Inventory.    Baseline  scored 60/100 on 12/09/2017    Time  8    Period  Weeks    Status  New    Target Date  02/03/18      PT LONG TERM GOAL #3   Title  Patient reports no vertigo with provoking motions or positions.    Time  8    Period  Weeks    Status  New    Target Date  02/03/18      PT LONG TERM GOAL #4   Title  Patient will have demonstrate decreased falls risk as indicated by Activities Specific Balance Confidence Scale score of 80% or greater.    Baseline   scored 36.2% on 12/09/2017    Time  8    Period  Weeks    Status  New    Target Date  02/03/18             Plan - 12/09/17 0808    Clinical Impression Statement  Patient presents to clinic with complaints of balance and vertigo issues. Patient with positive left and negative right Dix-Hallpike test. Patient treated with left canalith repositioning maneuvers on inclined mat table. Patient scored 36.2% on ABC scale, 60 on the [facility] and scored a  13/24 on the DGI indicating that he is at high risk for falls. Patient would benefit from PT services to address functional deficits, goals and to try to decrease patient's falls risk.     History and Personal Factors relevant to plan of care:  age, multiple comorbidities- multiple episodes of BPPV. multiple falls, cardiovascular disease, DM     Clinical Presentation  Evolving    Clinical Decision Making  Moderate    Rehab Potential  Fair    Clinical Impairments Affecting Rehab Potential  Positive Indicators: motivated, has improved with vestibular therapy in the past  Negative Indicators: chronicity, comorbidities    PT Frequency  2x / week 2 times a week for 2 weeks and then 1 time a week for 6 weeks    PT Duration  8 weeks    PT Treatment/Interventions  Canalith Repostioning;Neuromuscular re-education;Balance training;Therapeutic exercise;Therapeutic activities;Functional mobility training;Stair training;Gait training;Patient/family education;Vestibular    PT Next Visit Plan  Repeat Dix-Hallpike testing and CRT if indicated until clearance obtained; then work on balance activities;    PT Home Exercise Plan  --    Consulted and Agree with Plan of Care  Patient       Patient will benefit from skilled therapeutic intervention in order to improve the following deficits and impairments:  Decreased balance, Dizziness, Difficulty walking, Decreased strength, Decreased range of motion  Visit Diagnosis: Dizziness and giddiness  Difficulty in walking,  not elsewhere classified  G-Codes - 12/25/17 1254    Functional Assessment Tool Used (Outpatient Only)  clinical judgment, ABC scale, DHI, DGI    Functional Limitation  Mobility: Walking and moving around    Mobility: Walking and Moving Around Current Status ([id number])  At least 40 percent but less than 60 percent impaired, limited or restricted    Mobility: Walking and Moving Around Goal Status [phone])  At least 20 percent but less than 40 percent impaired, limited or restricted        Problem List Patient Active Problem List   Diagnosis Date Noted  . Malignant neoplasm of right lung ([place]) 04/18/2016  . CAD (coronary artery disease) 04/01/2016  . Adenocarcinoma ([address] [address]) 04/01/2016  . Skin cyst 12/26/2015  . GERD (gastroesophageal reflux disease) 06/16/2015  . Abscess of back 06/06/2015  . Vertigo 03/27/2015  . Carpal tunnel syndrome 10/28/2014  . Status post cholecystectomy 09/29/2014  . Disease of digestive tract 09/29/2014  . Nonischemic cardiomyopathy ([place]) 09/26/2014  . Cardiomyopathy ([place]) 09/26/2014  . Diabetes mellitus without complication ([place])   . Sleep apnea   . Spinal stenosis, lumbar region, with neurogenic claudication 06/07/2014  . Lumbar stenosis with neurogenic claudication 06/07/2014  . Abnormal gait 08/20/2012  . H/O total knee replacement 08/20/2012  . Arthritis of knee, degenerative 08/20/2012  . Pacemaker-St.Jude 08/03/2012  . Cardiac conduction disorder 06/19/2012  . Acid reflux 06/18/2012  . 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 Triggering of digit 03/25/2012  . Hyperlipidemia 12/23/2011  . Essential hypertension 03/23/2011  . Complete heart block ([place]) 03/23/2011  . Complete atrioventricular block ([place]) 03/23/2011   Lady Deutscher PT, DPT [phone] Lady Deutscher 12-25-17, 2:41 PM  [place] MAIN [facility] SERVICES [address] [place], [place], [address] Phone: [phone]   Fax:  [phone]  Name: Howard [name] MRN: [id number] Date of Birth: [birth year]

## 2017-12-09 NOTE — Telephone Encounter (Signed)
Judson Roch, how do I order this? Which speciality is this?

## 2017-12-10 ENCOUNTER — Telehealth: Payer: Self-pay | Admitting: Cardiovascular Disease

## 2017-12-10 NOTE — Telephone Encounter (Signed)
Patient dropped off BP log   Sun  149/79   153/86  thur  134/70  Fri  122/66   125/65  mon  164/93   159/89  tue  158/64  12/12  138/50   142/62  12/19  160/80  Patient also wants [place] to know he is being seen for vertigo

## 2017-12-10 NOTE — Telephone Encounter (Signed)
Patient is currently taking: 1) metoprolol succinate 25 mg daily 2) hctz 25 mg daily 3) entresto 49/51 mg BID  Will forward BP readings for Dr. [name] to review.

## 2017-12-12 ENCOUNTER — Other Ambulatory Visit: Payer: Self-pay

## 2017-12-12 ENCOUNTER — Ambulatory Visit: Payer: Medicare Other

## 2017-12-12 VITALS — BP 156/75 | HR 71

## 2017-12-12 DIAGNOSIS — R262 Difficulty in walking, not elsewhere classified: Secondary | ICD-10-CM

## 2017-12-12 DIAGNOSIS — R42 Dizziness and giddiness: Secondary | ICD-10-CM | POA: Diagnosis not present

## 2017-12-12 DIAGNOSIS — R296 Repeated falls: Secondary | ICD-10-CM | POA: Diagnosis not present

## 2017-12-12 NOTE — Therapy (Signed)
[place] MAIN [facility] SERVICES [address] [place], [place], [address] Phone: [phone]   Fax:  [phone]  Physical Therapy Treatment  Patient Details  Name: Howard [name] MRN: [id number] Date of Birth: Oct 25, 1931 Referring Provider: Dr. Natale Milch   Encounter Date: 12/12/2017  PT End of Session - 12/12/17 0808    Visit Number  2    Number of Visits  17    Date for PT Re-Evaluation  02/03/18    Authorization Type  Needs G codes    Authorization Time Period  2/10    PT Start Time  0810    PT Stop Time  0855    PT Time Calculation (min)  45 min    Equipment Utilized During Treatment  Gait belt    Activity Tolerance  Patient tolerated treatment well but did have episodes of vertigo requiring rest break    Behavior During Therapy  [facility] for tasks assessed/performed       Past Medical History:  Diagnosis Date  . Arthritis   . Bell palsy   . Cancer [facility])    prostate and skin  . Chronic combined systolic and diastolic CHF, NYHA class 1 ([place])    a. 07/2014 Echo: EF 35-40%, Gr 1 DD.  Marland Kitchen Complete heart block ([place])    a. 11/2010 s/p SJM 2210 [employer] DC PPM, ser# [id number].  . Depression   . Diabetes mellitus without complication ([place])   . Fall 11-10-14  . GERD (gastroesophageal reflux disease)   . History of prostate cancer   . Hyperlipidemia   . Hypertension   . LBBB (left bundle branch block)   . Left-sided Bell's palsy   . Lung cancer ([place]) 2016  . NICM (nonischemic cardiomyopathy) ([place])    a. 07/2014 Echo: EF 35-40%, mid-apicalanteroseptal DK, Gr 1 DD, mild-mod dil LA.  . Non-obstructive CAD    a. 07/2014 Abnl MV;  b. 08/2014 Cath: LM nl, LAD 30p, RI 40p, LCX nl, OM1 40, RCA dominant 30p, 70d-->Med Rx.  Marland Kitchen Poor balance   . Presence of permanent cardiac pacemaker   . Sleep apnea    a. cpap  . Vertigo   . WPW (Wolff-Parkinson-White syndrome)    a. S/P RFCA 1991.    Past Surgical History:  Procedure Laterality Date  . APPLICATION OF  WOUND VAC Left 06/07/2015   Procedure: APPLICATION OF WOUND VAC;  Surgeon: Robert Bellow, MD;  Location: [facility] ORS;  Service: General;  Laterality: Left;  left upper back  . BACK SURGERY     2011  . CARDIAC CATHETERIZATION  08/26/2014   Single vessel obstructive CAD  . CARPAL TUNNEL RELEASE  04-04-15   [facility]  . CATARACT EXTRACTION  07-31-11 and 09-18-11  . Catheter ablation  1991   for WPW  . cervical fusion    . CHOLECYSTECTOMY  09-07-14  . HAND SURGERY     right 1993; left 2005  . HERNIA REPAIR  1955  . INSERT / REPLACE / REMOVE PACEMAKER    . JOINT REPLACEMENT Left 2013   knee  . JOINT REPLACEMENT Right 2004   knee  . KNEE SURGERY     left knee 1991 and 1992; right knee 1995  . LEFT HEART CATHETERIZATION WITH CORONARY ANGIOGRAM N/A 08/26/2014   Procedure: LEFT HEART CATHETERIZATION WITH CORONARY ANGIOGRAM;  Surgeon: [name], MD;  Location: [facility] CATH LAB;  Service: Cardiovascular;  Laterality: N/A;  . LUMBAR LAMINECTOMY/DECOMPRESSION MICRODISCECTOMY N/A 06/07/2014   Procedure:  LUMBAR FOUR TO FIVE LUMBAR LAMINECTOMY/DECOMPRESSION MICRODISCECTOMY 1 LEVEL;  Surgeon: [name], MD;  Location: [facility] NEURO ORS;  Service: Neurosurgery;  Laterality: N/A;  . LUNG BIOPSY Right 2016   Dr [name]  . PACEMAKER INSERTION     PPM-- [employer] Jude 11/30/10 by [name]  . PROSTATE SURGERY     cancer--1998, prostatectomy  . REPLACEMENT TOTAL KNEE     2004  . ruptured disc     1962 and 1998  . TRIGGER FINGER RELEASE  01-24-15  . WOUND DEBRIDEMENT Left 06/07/2015   Procedure: DEBRIDEMENT WOUND;  Surgeon: Robert Bellow, MD;  Location: [facility] ORS;  Service: General;  Laterality: Left;  left upper back    Vitals:   12/12/17 0814  BP: (!) 156/75  Pulse: 71  SpO2: 98%    Subjective Assessment - 12/12/17 0808    Subjective  Pt reports that he has been feeling "uneasy" with respect to his balance recently. He has not had any further episodes of vertigo since his initial evaluation. No specific questions at this  time.    Pertinent History  Patient know to this Probation officer and patient has been seen several times since 2015 for repeat episodes of BPPV and balance difficulties. Patient states he was doing pretty good until yesterday when he fell off the porch backwards hitting a brick wall. Patient states he did not hit his head, but did hit his left elbow. Patient states he had been reaching over to get something and states he had an episode of vertigo. Patient states he was having trouble walking in the last few days on and off and all day yesterday. Patient states he is using a SPC because he feels unsteady currently. Patient states when he woke up today he has been unstable walking but that it has been improving as the day goes on. Patient reports that he has been getting shots in his spine for arthritic pain. Patient states he has had several episodes of his left thigh locking up, but states this has not caused him to fall.     Patient Stated Goals  Patient would like to be able to lie in prone to shoot an air rifle, be able to walk without the cane and to have less dizziness and be more steady on his feet.    Currently in Pain?  No/denies         [facility] PT Assessment - 12/12/17 0833      Standardized Balance Assessment   Standardized Balance Assessment  Berg Balance Test;Five Times Sit to Stand    Five times sit to stand comments   17.4s      Berg Balance Test   Sit to Stand  Able to stand without using hands and stabilize independently    Standing Unsupported  Able to stand safely 2 minutes    Sitting with Back Unsupported but Feet Supported on Floor or Stool  Able to sit safely and securely 2 minutes    Stand to Sit  Sits safely with minimal use of hands    Transfers  Able to transfer safely, minor use of hands    Standing Unsupported with Eyes Closed  Able to stand 10 seconds safely    Standing Ubsupported with Feet Together  Able to place feet together independently and stand 1 minute safely    From  Standing, Reach Forward with Outstretched Arm  Can reach forward >12 cm safely (5")    From Standing Position, Pick up Object from  Floor  Able to pick up shoe, needs supervision    From Standing Position, Turn to Look Behind Over each Shoulder  Looks behind from both sides and weight shifts well    Turn 360 Degrees  Able to turn 360 degrees safely in 4 seconds or less    Standing Unsupported, Alternately Place Feet on Step/Stool  Able to stand independently and safely and complete 8 steps in 20 seconds    Standing Unsupported, One Foot in Front  Able to plae foot ahead of the other independently and hold 30 seconds    Standing on One Leg  Tries to lift leg/unable to hold 3 seconds but remains standing independently    Total Score  50          TREATMENT   Canalith Repositioning: Patient with limited cervical extension ROM therefore performed testing on inclined mat table.  On inverted mat table repeated right Dix-Hallpike testing and it was negative  On inverted mat table performed left Dix-Hallpike testing and it was faintly positive with left upbeating torsional nystagmus of short very duration, only a few beats. No latency today and pt reports only a very faint sense of vertigo lasting less than 3 seconds. Performed left canalith repositioning maneuver (CRT). Repeated L CRT for a total of 2 maneuvers with retesting between maneuvers. Pt with negative L Dix Hallpike test after first CRT with no symptoms or nystagmus. Second bout of CRT performed to ensure full clearance of debris.   Neuromuscular Re-education Reviewed HEP from last visit of prior episode and performed with patient: Sit to stand 2 x 10;   Mini squats 2 x 10; Semi/full tandem stance 30s holds without head turns alternating LE; Pt provided written handout with exercises with education about how to perform safely at home.                 PT Education - 12/12/17 0808    Education provided  Yes    Education  Details  BPPV treatment, HEP    Person(s) Educated  Patient    Methods  Explanation;Handout;Demonstration    Comprehension  Verbalized understanding       PT Short Term Goals - 12/09/17 1253      PT SHORT TERM GOAL #1   Title  Patient will report 50% or greater improvement in his symptoms of dizziness and imbalance with provoking motions or positions.    Time  4    Period  Weeks    Status  New    Target Date  01/06/18        PT Long Term Goals - 12/12/17 1255      PT LONG TERM GOAL #1   Title  Patient will demonstrate reduced falls risk as evidenced by Dynamic Gait Index (DGI) >19/24 by 06/17/2017.    Baseline  scored 13/24 on12/18/2018    Time  8    Period  Weeks    Status  New    Target Date  02/03/18      PT LONG TERM GOAL #2   Title  Patient will reduce perceived disability to low levels as indicated by <40 on Dizziness Handicap Inventory.    Baseline  scored 60/100 on 12/09/2017    Time  8    Period  Weeks    Status  New    Target Date  02/03/18      PT LONG TERM GOAL #3   Title  Patient reports no vertigo with provoking motions or  positions.    Time  8    Period  Weeks    Status  New    Target Date  02/03/18      PT LONG TERM GOAL #4   Title  Patient will have demonstrate decreased falls risk as indicated by Activities Specific Balance Confidence Scale score of 80% or greater.    Baseline  scored 36.2% on 12/09/2017    Time  8    Period  Weeks    Status  New    Target Date  02/03/18      PT LONG TERM GOAL #5   Title  Pt will decrease 5TSTS by at least 3 seconds in order to demonstrate clinically significant improvement in LE strength    Baseline  12/12/17: 17.4s    Time  8    Period  Weeks    Status  New    Target Date  02/03/18            Plan - 12/12/17 0809    Clinical Impression Statement  Pt with faintly positive Dix-Hallpike test today on the left side which resolves after first CRT. Right side remains negative with testing. BERG 50/56  which confirms appropriate use of single point cane for balance with ambulation. Five Time Sit to Stand test of 17.4s which indicates decreased LE strength. He was issued home exercise program for LE strength and balance. Pt will follow-up for retesting for R posterior canal BPPV and treatment as indicated. Will continue to progress balance and strength activities as appropriate. Pt encouraged to follow-up as scheduled.     Rehab Potential  Fair    Clinical Impairments Affecting Rehab Potential  Positive Indicators: motivated, has improved with vestibular therapy in the past  Negative Indicators: chronicity, comorbidities    PT Frequency  2x / week 2 times a week for 2 weeks and then 1 time a week for 6 weeks    PT Duration  8 weeks    PT Treatment/Interventions  Canalith Repostioning;Neuromuscular re-education;Balance training;Therapeutic exercise;Therapeutic activities;Functional mobility training;Stair training;Gait training;Patient/family education;Vestibular    PT Next Visit Plan  Repeat Dix-Hallpike testing and CRT if indicated until clearance obtained; balance and strength training    PT Home Exercise Plan  Sit to stand 2 x 10 BID, mini squats 2 x 10 BID, semitandem balance 2 x 10 bilateral    Consulted and Agree with Plan of Care  Patient       Patient will benefit from skilled therapeutic intervention in order to improve the following deficits and impairments:  Decreased balance, Dizziness, Difficulty walking, Decreased strength, Decreased range of motion  Visit Diagnosis: Dizziness and giddiness  Difficulty in walking, not elsewhere classified     Problem List Patient Active Problem List   Diagnosis Date Noted  . Malignant neoplasm of right lung ([place]) 04/18/2016  . CAD (coronary artery disease) 04/01/2016  . Adenocarcinoma ([place]) 04/01/2016  . Skin cyst 12/26/2015  . GERD (gastroesophageal reflux disease) 06/16/2015  . Abscess of back 06/06/2015  . Vertigo 03/27/2015  .  Carpal tunnel syndrome 10/28/2014  . Status post cholecystectomy 09/29/2014  . Disease of digestive tract 09/29/2014  . Nonischemic cardiomyopathy ([place]) 09/26/2014  . Cardiomyopathy ([place]) 09/26/2014  . Diabetes mellitus without complication ([place])   . Sleep apnea   . Spinal stenosis, lumbar region, with neurogenic claudication 06/07/2014  . Lumbar stenosis with neurogenic claudication 06/07/2014  . Abnormal gait 08/20/2012  . H/O total knee replacement 08/20/2012  . Arthritis of knee,  degenerative 08/20/2012  . Pacemaker-St.Jude 08/03/2012  . Cardiac conduction disorder 06/19/2012  . Acid reflux 06/18/2012  . 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 Triggering of digit 03/25/2012  . Hyperlipidemia 12/23/2011  . Essential hypertension 03/23/2011  . Complete heart block ([place]) 03/23/2011  . Complete atrioventricular block ([place]) 03/23/2011   Phillips Grout PT, DPT   Howard [name] 12/12/2017, 1:01 PM  [place] MAIN [facility] SERVICES [address] [place], [place], [address] Phone: [phone]   Fax:  [phone]  Name: Howard [name] MRN: [id number] Date of Birth: 1931-08-05

## 2017-12-15 NOTE — Telephone Encounter (Signed)
For elevated pressures, Would increase the entresto up to 97/103 mg BID Repeat BMP in one month

## 2017-12-17 ENCOUNTER — Ambulatory Visit: Payer: Medicare Other | Admitting: Physical Therapy

## 2017-12-17 DIAGNOSIS — R262 Difficulty in walking, not elsewhere classified: Secondary | ICD-10-CM

## 2017-12-17 DIAGNOSIS — R42 Dizziness and giddiness: Secondary | ICD-10-CM | POA: Diagnosis not present

## 2017-12-17 DIAGNOSIS — R296 Repeated falls: Secondary | ICD-10-CM | POA: Diagnosis not present

## 2017-12-17 NOTE — Telephone Encounter (Signed)
Per the patient's chart, entresto 49/51 mg BID was just filled for a #90 day RX on 11/25/17. Staff message sent to pharmacy asking if it is ok for the patient to take 2 of the 49/51 mg tabs twice daily until he uses them up.  Waiting for pharmacy reply.

## 2017-12-17 NOTE — Therapy (Signed)
[place] MAIN [facility] SERVICES [address] [place], [place], [address] Phone: [phone]   Fax:  [phone]  Physical Therapy Treatment  Patient Details  Name: Howard [name] MRN: [id number] Date of Birth: [birth year] Referring Provider: Dr. Natale Milch   Encounter Date: 12/17/2017  PT End of Session - 12/17/17 1013    Visit Number  3    Number of Visits  17    Date for PT Re-Evaluation  02/03/18    Authorization Type  Needs G codes    Authorization Time Period  3/10    PT Start Time  1013    PT Stop Time  1059    PT Time Calculation (min)  46 min    Equipment Utilized During Treatment  Gait belt    Activity Tolerance  Patient tolerated treatment well but did have episodes of vertigo requiring rest break    Behavior During Therapy  [facility] for tasks assessed/performed       Past Medical History:  Diagnosis Date  . Arthritis   . Bell palsy   . Cancer [facility])    prostate and skin  . Chronic combined systolic and diastolic CHF, NYHA class 1 ([place])    a. 07/2014 Echo: EF 35-40%, Gr 1 DD.  Marland Kitchen Complete heart block ([place])    a. 11/2010 s/p SJM 2210 [employer] DC PPM, ser# [id number].  . Depression   . Diabetes mellitus without complication ([place])   . Fall 11-10-14  . GERD (gastroesophageal reflux disease)   . History of prostate cancer   . Hyperlipidemia   . Hypertension   . LBBB (left bundle branch block)   . Left-sided Bell's palsy   . Lung cancer ([place]) 2016  . NICM (nonischemic cardiomyopathy) ([place])    a. 07/2014 Echo: EF 35-40%, mid-apicalanteroseptal DK, Gr 1 DD, mild-mod dil LA.  . Non-obstructive CAD    a. 07/2014 Abnl MV;  b. 08/2014 Cath: LM nl, LAD 30p, RI 40p, LCX nl, OM1 40, RCA dominant 30p, 70d-->Med Rx.  Marland Kitchen Poor balance   . Presence of permanent cardiac pacemaker   . Sleep apnea    a. cpap  . Vertigo   . WPW (Wolff-Parkinson-White syndrome)    a. S/P RFCA 1991.    Past Surgical History:  Procedure Laterality Date  . APPLICATION OF  WOUND VAC Left 06/07/2015   Procedure: APPLICATION OF WOUND VAC;  Surgeon: Robert Bellow, MD;  Location: [facility] ORS;  Service: General;  Laterality: Left;  left upper back  . BACK SURGERY     2011  . CARDIAC CATHETERIZATION  08/26/2014   Single vessel obstructive CAD  . CARPAL TUNNEL RELEASE  04-04-15   [facility]  . CATARACT EXTRACTION  07-31-11 and 09-18-11  . Catheter ablation  1991   for WPW  . cervical fusion    . CHOLECYSTECTOMY  09-07-14  . HAND SURGERY     right 1993; left 2005  . HERNIA REPAIR  1955  . INSERT / REPLACE / REMOVE PACEMAKER    . JOINT REPLACEMENT Left 2013   knee  . JOINT REPLACEMENT Right 2004   knee  . KNEE SURGERY     left knee 1991 and 1992; right knee 1995  . LEFT HEART CATHETERIZATION WITH CORONARY ANGIOGRAM N/A 08/26/2014   Procedure: LEFT HEART CATHETERIZATION WITH CORONARY ANGIOGRAM;  Surgeon: [name], MD;  Location: [facility] CATH LAB;  Service: Cardiovascular;  Laterality: N/A;  . LUMBAR LAMINECTOMY/DECOMPRESSION MICRODISCECTOMY N/A 06/07/2014   Procedure:  LUMBAR FOUR TO FIVE LUMBAR LAMINECTOMY/DECOMPRESSION MICRODISCECTOMY 1 LEVEL;  Surgeon: [name], MD;  Location: [place] NEURO ORS;  Service: Neurosurgery;  Laterality: N/A;  . LUNG BIOPSY Right 2016   Dr [name]  . PACEMAKER INSERTION     PPM-- St Jude 11/30/10 by [name]  . PROSTATE SURGERY     cancer--1998, prostatectomy  . REPLACEMENT TOTAL KNEE     2004  . ruptured disc     1962 and 1998  . TRIGGER FINGER RELEASE  01-24-15  . WOUND DEBRIDEMENT Left 06/07/2015   Procedure: DEBRIDEMENT WOUND;  Surgeon: Robert Bellow, MD;  Location: [facility] ORS;  Service: General;  Laterality: Left;  left upper back    There were no vitals filed for this visit.  Subjective Assessment - 12/17/17 1012    Subjective  Patient reports no further episodes of dizziness or vertigo. Patient states he has not done the home exercises yet. Patient states he has been walking  without his cane okay. Patient states he had shots for his back  about 2 weeks ago which he states is helping his back and walking ability.     Pertinent History  Patient know to this Probation officer and patient has been seen several times since 2015 for repeat episodes of BPPV and balance difficulties. Patient states he was doing pretty good until yesterday when he fell off the porch backwards hitting a brick wall. Patient states he did not hit his head, but did hit his left elbow. Patient states he had been reaching over to get something and states he had an episode of vertigo. Patient states he was having trouble walking in the last few days on and off and all day yesterday. Patient states he is using a SPC because he feels unsteady currently. Patient states when he woke up today he has been unstable walking but that it has been improving as the day goes on. Patient reports that he has been getting shots in his spine for arthritic pain. Patient states he has had several episodes of his left thigh locking up, but states this has not caused him to fall.     Patient Stated Goals  Patient would like to be able to lie in prone to shoot an air rifle, be able to walk without the cane and to have less dizziness and be more steady on his feet.    Currently in Pain?  No/denies       Neuromuscular Re-education:  Dix-Hallpike testing: On mat table, performed left Dix-Hallpike testing and it was negative with no nystagmus observed and patient denied vertigo.  Airex pad:  On firm surface and then on Airex pad, patient performed semi-tandem progressions with alternating lead leg with and without body turns and clock arm reaching.  Patient has more difficulty with left leg being the lead leg as compared to the right. Patient able to achieve full tandem on firm surface if right leg is the lead leg and when the left leg is lead leg, patient able to achieve semi-tandem with foot offset about 1" from full heel-toe position.  Patient's balance challenged by adding in body turning and clock  arm reaching components.   Mini-squats:  Patient performed on firm surface 2 sets of 20 reps mini-squats with verbal cues for technique and to hold each squat for 3 seconds without holding with UEs for support.   Cone tapping: On firm surface, patient performed cone tapping in series of one, two and then three targets as called  out by therapist.  Repeated activity standing on Airex pad while doing cone tapping. Patient has more difficulty with single leg stance on right leg as compared to left.  Patient performed without UEs support but would occasionally briefly touch // bar with hand to regain balance.   Sit to Stands:  Patient performed 10 reps sit to stand from armless chair with S with good technique.  Discussed progressions of this exercise.  Therapeutic Exercise: Performed Biodex tower cable system machine walking forward 5 reps with  12.5 pounds. However, patient reported that his left thigh was "locking up" and getting numb. Patient states this happens regularly due to his low back issues. Therefore deferred further reps this date. Patient reported relief after brief seated rest break.       PT Education - 12/17/17 1012    Education provided  Yes    Education Details  discussed plan of care; reviewed HEP    Person(s) Educated  Patient    Methods  Explanation;Verbal cues    Comprehension  Verbalized understanding;Returned demonstration       PT Short Term Goals - 12/09/17 1253      PT SHORT TERM GOAL #1   Title  Patient will report 50% or greater improvement in his symptoms of dizziness and imbalance with provoking motions or positions.    Time  4    Period  Weeks    Status  New    Target Date  01/06/18        PT Long Term Goals - 12/12/17 1255      PT LONG TERM GOAL #1   Title  Patient will demonstrate reduced falls risk as evidenced by Dynamic Gait Index (DGI) >19/24 by 06/17/2017.    Baseline  scored 13/24 on12/18/2018    Time  8    Period  Weeks    Status   New    Target Date  02/03/18      PT LONG TERM GOAL #2   Title  Patient will reduce perceived disability to low levels as indicated by <40 on Dizziness Handicap Inventory.    Baseline  scored 60/100 on 12/09/2017    Time  8    Period  Weeks    Status  New    Target Date  02/03/18      PT LONG TERM GOAL #3   Title  Patient reports no vertigo with provoking motions or positions.    Time  8    Period  Weeks    Status  New    Target Date  02/03/18      PT LONG TERM GOAL #4   Title  Patient will have demonstrate decreased falls risk as indicated by Activities Specific Balance Confidence Scale score of 80% or greater.    Baseline  scored 36.2% on 12/09/2017    Time  8    Period  Weeks    Status  New    Target Date  02/03/18      PT LONG TERM GOAL #5   Title  Pt will decrease 5TSTS by at least 3 seconds in order to demonstrate clinically significant improvement in LE strength    Baseline  12/12/17: 17.4s    Time  8    Period  Weeks    Status  New    Target Date  02/03/18            Plan - 12/17/17 1013    Clinical Impression Statement  Patient with negative  left Dix-Hallpike test this date. Patient reports that he has not attempted his HEP since last visit. Reviewed HEP and cued for proper technique. Patient challenged by balance activities of semi-tandem stance progressions on Airex pad and cone tapping this date. Patient would benefit from further PT services to address functional deficits, improve balance and decrease falls risk.     Rehab Potential  Fair    Clinical Impairments Affecting Rehab Potential  Positive Indicators: motivated, has improved with vestibular therapy in the past  Negative Indicators: chronicity, comorbidities    PT Frequency  2x / week 2 times a week for 2 weeks and then 1 time a week for 6 weeks    PT Duration  8 weeks    PT Treatment/Interventions  Canalith Repostioning;Neuromuscular re-education;Balance training;Therapeutic exercise;Therapeutic  activities;Functional mobility training;Stair training;Gait training;Patient/family education;Vestibular    PT Next Visit Plan  balance and strength training; try the new bike, airex balance beam    PT Home Exercise Plan  Sit to stand 2 x 10 BID, mini squats 2 x 10 BID, semitandem balance 2 x 10 bilateral    Consulted and Agree with Plan of Care  Patient       Patient will benefit from skilled therapeutic intervention in order to improve the following deficits and impairments:  Decreased balance, Dizziness, Difficulty walking, Decreased strength, Decreased range of motion  Visit Diagnosis: Dizziness and giddiness  Difficulty in walking, not elsewhere classified  Repeated falls     Problem List Patient Active Problem List   Diagnosis Date Noted  . Malignant neoplasm of right lung ([place]) 04/18/2016  . CAD (coronary artery disease) 04/01/2016  . Adenocarcinoma ([place]) 04/01/2016  . Skin cyst 12/26/2015  . GERD (gastroesophageal reflux disease) 06/16/2015  . Abscess of back 06/06/2015  . Vertigo 03/27/2015  . Carpal tunnel syndrome 10/28/2014  . Status post cholecystectomy 09/29/2014  . Disease of digestive tract 09/29/2014  . Nonischemic cardiomyopathy ([place]) 09/26/2014  . Cardiomyopathy ([place]) 09/26/2014  . Diabetes mellitus without complication ([place])   . Sleep apnea   . Spinal stenosis, lumbar region, with neurogenic claudication 06/07/2014  . Lumbar stenosis with neurogenic claudication 06/07/2014  . Abnormal gait 08/20/2012  . H/O total knee replacement 08/20/2012  . Arthritis of knee, degenerative 08/20/2012  . Pacemaker-St.Jude 08/03/2012  . Cardiac conduction disorder 06/19/2012  . Acid reflux 06/18/2012  . 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 Triggering of digit 03/25/2012  . Hyperlipidemia 12/23/2011  . Essential hypertension 03/23/2011  . Complete heart block ([place]) 03/23/2011  . Complete atrioventricular block ([place]) 03/23/2011   Lady Deutscher PT,  DPT [id number] Lady Deutscher 12/17/2017, 12:43 PM  [place] MAIN [facility] SERVICES [address] [place], [place], [address] Phone: [phone]   Fax:  [phone]  Name: Howard [name] MRN: [id number] Date of Birth: 02/25/1931

## 2017-12-18 MED ORDER — SACUBITRIL-VALSARTAN 49-51 MG PO TABS: 2.0000 | ORAL_TABLET | Freq: Two times a day (BID) | ORAL | Status: DC

## 2017-12-18 NOTE — Telephone Encounter (Signed)
Message received back from Eino Farber, Pharm D- ok for the patient to take entresto 49/51 mg- 2 tablets twice daily until her uses them up and then can go to the 97/103 mg strength tablets.   I have called and notified the patient's wife (DPR) of Dr. Donivan Scull recommendations.  She states the patient has been dealing with vertigo and going to therapy for this. He fell about 10 days ago due to vertigo. I have advised Mrs. Kaeser that the patient can take 2 tablets twice daily of the 49/51 mg tablets. I would have him do this about 5 days and track his BP, then call us back with the readings and how he is doing on the dose. Mr. Moffatt is due to go back to therapy for his vertigo around 12/26/17. I advised the patient's wife, if he would like to wait until his next therapy session then start the higher dose, that should be ok. She is agreeable to calling us back once the patient has been on the increased dose of entresto for about 5 days- we can then send in a new RX and set up a repeat BMP at that time.

## 2017-12-18 NOTE — Telephone Encounter (Signed)
PT ordered.

## 2017-12-18 NOTE — Telephone Encounter (Signed)
Put an order for physical therapy in and put in notes that pt needs vestibular rehab

## 2017-12-26 ENCOUNTER — Ambulatory Visit: Payer: Medicare Other | Attending: Family Medicine | Admitting: Physical Therapy

## 2017-12-26 ENCOUNTER — Encounter: Payer: Self-pay | Admitting: Physical Therapy

## 2017-12-26 VITALS — BP 155/68

## 2017-12-26 DIAGNOSIS — R42 Dizziness and giddiness: Secondary | ICD-10-CM | POA: Diagnosis not present

## 2017-12-26 DIAGNOSIS — R296 Repeated falls: Secondary | ICD-10-CM | POA: Insufficient documentation

## 2017-12-26 DIAGNOSIS — R262 Difficulty in walking, not elsewhere classified: Secondary | ICD-10-CM | POA: Diagnosis not present

## 2017-12-26 NOTE — Therapy (Signed)
[place] MAIN [facility] SERVICES [place], [place], [address] Phone: [phone]   Fax:  [phone]  Physical Therapy Treatment  Patient Details  Name: Howard [name] MRN: [id number] Date of Birth: [birth year] Referring Provider: Dr. Natale Milch   Encounter Date: 12/26/2017  PT End of Session - 12/26/17 0903    Visit Number  4    Number of Visits  17    Date for PT Re-Evaluation  02/03/18    PT Start Time  0900    PT Stop Time  0945    PT Time Calculation (min)  45 min    Equipment Utilized During Treatment  Gait belt    Activity Tolerance  Patient tolerated treatment well but did have episodes of vertigo requiring rest break    Behavior During Therapy  [facility] for tasks assessed/performed       Past Medical History:  Diagnosis Date  . Arthritis   . Bell palsy   . Cancer [facility])    prostate and skin  . Chronic combined systolic and diastolic CHF, NYHA class 1 ([place])    a. 07/2014 Echo: EF 35-40%, Gr 1 DD.  Marland Kitchen Complete heart block ([place])    a. 11/2010 s/p SJM 2210 Accent DC PPM, ser# [id number].  . Depression   . Diabetes mellitus without complication ([place])   . Fall 11-10-14  . GERD (gastroesophageal reflux disease)   . History of prostate cancer   . Hyperlipidemia   . Hypertension   . LBBB (left bundle branch block)   . Left-sided Bell's palsy   . Lung cancer ([place]) 2016  . NICM (nonischemic cardiomyopathy) ([place])    a. 07/2014 Echo: EF 35-40%, mid-apicalanteroseptal DK, Gr 1 DD, mild-mod dil LA.  . Non-obstructive CAD    a. 07/2014 Abnl MV;  b. 08/2014 Cath: LM nl, LAD 30p, RI 40p, LCX nl, OM1 40, RCA dominant 30p, 70d-->Med Rx.  Marland Kitchen Poor balance   . Presence of permanent cardiac pacemaker   . Sleep apnea    a. cpap  . Vertigo   . WPW (Wolff-Parkinson-White syndrome)    a. S/P RFCA 1991.    Past Surgical History:  Procedure Laterality Date  . APPLICATION OF WOUND VAC Left 06/07/2015   Procedure: APPLICATION OF WOUND VAC;  Surgeon:  Robert Bellow, MD;  Location: [facility] ORS;  Service: General;  Laterality: Left;  left upper back  . BACK SURGERY     2011  . CARDIAC CATHETERIZATION  08/26/2014   Single vessel obstructive CAD  . CARPAL TUNNEL RELEASE  04-04-15   [facility]  . CATARACT EXTRACTION  07-31-11 and 09-18-11  . Catheter ablation  1991   for WPW  . cervical fusion    . CHOLECYSTECTOMY  09-07-14  . HAND SURGERY     right 1993; left 2005  . HERNIA REPAIR  1955  . INSERT / REPLACE / REMOVE PACEMAKER    . JOINT REPLACEMENT Left 2013   knee  . JOINT REPLACEMENT Right 2004   knee  . KNEE SURGERY     left knee 1991 and 1992; right knee 1995  . LEFT HEART CATHETERIZATION WITH CORONARY ANGIOGRAM N/A 08/26/2014   Procedure: LEFT HEART CATHETERIZATION WITH CORONARY ANGIOGRAM;  Surgeon: [name], MD;  Location: [facility] CATH LAB;  Service: Cardiovascular;  Laterality: N/A;  . LUMBAR LAMINECTOMY/DECOMPRESSION MICRODISCECTOMY N/A 06/07/2014   Procedure: LUMBAR FOUR TO FIVE LUMBAR LAMINECTOMY/DECOMPRESSION MICRODISCECTOMY 1 LEVEL;  Surgeon: [name], MD;  Location:  [facility] NEURO ORS;  Service: Neurosurgery;  Laterality: N/A;  . LUNG BIOPSY Right 2016   Dr [name]  . PACEMAKER INSERTION     PPM-- St Jude 11/30/10 by [name]  . PROSTATE SURGERY     cancer--1998, prostatectomy  . REPLACEMENT TOTAL KNEE     2004  . ruptured disc     1962 and 1998  . TRIGGER FINGER RELEASE  01-24-15  . WOUND DEBRIDEMENT Left 06/07/2015   Procedure: DEBRIDEMENT WOUND;  Surgeon: Robert Bellow, MD;  Location: [facility] ORS;  Service: General;  Laterality: Left;  left upper back    Vitals:   12/26/17 0955 12/26/17 0956  BP: (!) 152/91 (!) 155/68    Subjective Assessment - 12/26/17 0902    Subjective  Patient reports he has done his HEP "some". Reinforced importance of HEP. Patient states that he has not had any further episodes of dizziness and states his "head is clearer". Patient states he feels he is also walking straighter. Patient reports he has been able  to been over and pick up things again without dizziness.     Pertinent History  Patient know to this Probation officer and patient has been seen several times since 2015 for repeat episodes of BPPV and balance difficulties. Patient states he was doing pretty good until yesterday when he fell off the porch backwards hitting a brick wall. Patient states he did not hit his head, but did hit his left elbow. Patient states he had been reaching over to get something and states he had an episode of vertigo. Patient states he was having trouble walking in the last few days on and off and all day yesterday. Patient states he is using a SPC because he feels unsteady currently. Patient states when he woke up today he has been unstable walking but that it has been improving as the day goes on. Patient reports that he has been getting shots in his spine for arthritic pain. Patient states he has had several episodes of his left thigh locking up, but states this has not caused him to fall.     Patient Stated Goals  Patient would like to be able to lie in prone to shoot an air rifle, be able to walk without the cane and to have less dizziness and be more steady on his feet.    Currently in Pain?  No/denies       Neuromuscular Re-education:  Worked on slow marching with 3 second holds 10 reps each leg.  Worked on step up/step down on 5" wooden step without UEs support 10 reps. Repeated this activity with stepping up onto wooden step with purple foam pad placed on top 10 reps with patient intermittently touching with one hand for support for balance.   Airex balance beam: On Airex balance beam, performed sideways stance static holds.  On Airex balance beam, performed sideways stepping with and without horizontal head turns 5' times 4 reps.  Reviewed HEP exercises with patient.    Therapeutic Exercise: Performed 10 minute warm-up on Octane fitness bike at level 6 with vital signs monitored pre and post. Patient with elevated  blood pressure this date. Patient reports that his cardiologist increased his Entresto  medication to try to lower his blood pressure. Patient reports he was able to perform bike riding without back pain this date. Performed mini-squats 1 set of 15 reps and 1 set of 10 reps. Patient reports fatigue in left quad with this exercise.    PT Education -  12/26/17 5638    Education provided  Yes    Education Details  reinforced importance of HEP    Person(s) Educated  Patient    Methods  Demonstration;Explanation;Verbal cues    Comprehension  Verbalized understanding;Returned demonstration       PT Short Term Goals - 12/26/17 0958      PT SHORT TERM GOAL #1   Title  Patient will report 50% or greater improvement in his symptoms of dizziness and imbalance with provoking motions or positions.    Time  4    Period  Weeks    Status  Achieved        PT Long Term Goals - 12/26/17 0957      PT LONG TERM GOAL #1   Title  Patient will demonstrate reduced falls risk as evidenced by Dynamic Gait Index (DGI) >19/24 by 06/17/2017.    Baseline  scored 13/24 on12/18/2018    Time  8    Period  Weeks    Status  On-going      PT LONG TERM GOAL #2   Title  Patient will reduce perceived disability to low levels as indicated by <40 on Dizziness Handicap Inventory.    Baseline  scored 60/100 on 12/09/2017    Time  8    Period  Weeks    Status  On-going      PT LONG TERM GOAL #3   Title  Patient reports no vertigo with provoking motions or positions.    Time  8    Period  Weeks    Status  Achieved      PT LONG TERM GOAL #4   Title  Patient will have demonstrate decreased falls risk as indicated by Activities Specific Balance Confidence Scale score of 80% or greater.    Baseline  scored 36.2% on 12/09/2017    Time  8    Period  Weeks    Status  On-going      PT LONG TERM GOAL #5   Title  Pt will decrease 5TSTS by at least 3 seconds in order to demonstrate clinically significant improvement in  LE strength    Baseline  12/12/17: 17.4s    Time  8    Period  Weeks    Status  On-going            Plan - 12/26/17 7564    Clinical Impression Statement  Patient reports no further episodes of dizziness and that he has been able to do activities around the home such as bending over without symptoms. Patient does continue to have decreased balance and strength and would benefit from continued PT services to address these issues. Patient challenged this date by slow marching with 3 second holds, airex balance beam sidestepping and step up/step down on 5" box. Patient able to tolerate Octane fitness bike well without pain.     Rehab Potential  Fair    Clinical Impairments Affecting Rehab Potential  Positive Indicators: motivated, has improved with vestibular therapy in the past  Negative Indicators: chronicity, comorbidities    PT Frequency  2x / week 2 times a week for 2 weeks and then 1 time a week for 6 weeks    PT Duration  8 weeks    PT Treatment/Interventions  Canalith Repostioning;Neuromuscular re-education;Balance training;Therapeutic exercise;Therapeutic activities;Functional mobility training;Stair training;Gait training;Patient/family education;Vestibular    PT Next Visit Plan  balance and strength training; new bike    PT Home Exercise Plan  Sit to stand  2 x 10 BID, mini squats 2 x 10 BID, semitandem balance 2 x 10 bilateral    Consulted and Agree with Plan of Care  Patient       Patient will benefit from skilled therapeutic intervention in order to improve the following deficits and impairments:  Decreased balance, Dizziness, Difficulty walking, Decreased strength, Decreased range of motion  Visit Diagnosis: Dizziness and giddiness  Difficulty in walking, not elsewhere classified  Repeated falls     Problem List Patient Active Problem List   Diagnosis Date Noted  . Malignant neoplasm of right lung ([place]) 04/18/2016  . CAD (coronary artery disease) 04/01/2016  .  Adenocarcinoma ([place]) 04/01/2016  . Skin cyst 12/26/2015  . GERD (gastroesophageal reflux disease) 06/16/2015  . Abscess of back 06/06/2015  . Vertigo 03/27/2015  . Carpal tunnel syndrome 10/28/2014  . Status post cholecystectomy 09/29/2014  . Disease of digestive tract 09/29/2014  . Nonischemic cardiomyopathy ([place]) 09/26/2014  . Cardiomyopathy ([place]) 09/26/2014  . Diabetes mellitus without complication ([place])   . Sleep apnea   . Spinal stenosis, lumbar region, with neurogenic claudication 06/07/2014  . Lumbar stenosis with neurogenic claudication 06/07/2014  . Abnormal gait 08/20/2012  . H/O total knee replacement 08/20/2012  . Arthritis of knee, degenerative 08/20/2012  . Pacemaker-St.Jude 08/03/2012  . Cardiac conduction disorder 06/19/2012  . Acid reflux 06/18/2012  . 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 Triggering of digit 03/25/2012  . Hyperlipidemia 12/23/2011  . Essential hypertension 03/23/2011  . Complete heart block ([place]) 03/23/2011  . Complete atrioventricular block ([place]) 03/23/2011   Lady Deutscher PT, DPT [phone] Lady Deutscher 12/26/2017, 11:04 AM  [place] MAIN [facility] SERVICES [address] [place], [place], [address] Phone: [phone]   Fax:  [phone]  Name: Howard [name] MRN: [id number] Date of Birth: 1931-04-12

## 2017-12-30 ENCOUNTER — Encounter: Payer: Self-pay | Admitting: Physical Therapy

## 2017-12-30 ENCOUNTER — Ambulatory Visit: Payer: Medicare Other | Admitting: Physical Therapy

## 2017-12-30 DIAGNOSIS — R296 Repeated falls: Secondary | ICD-10-CM

## 2017-12-30 DIAGNOSIS — R262 Difficulty in walking, not elsewhere classified: Secondary | ICD-10-CM | POA: Diagnosis not present

## 2017-12-30 DIAGNOSIS — R42 Dizziness and giddiness: Secondary | ICD-10-CM | POA: Diagnosis not present

## 2017-12-30 NOTE — Therapy (Signed)
[place] MAIN [facility] SERVICES [address] [place], [place], [address] Phone: [phone]   Fax:  [phone]  Physical Therapy Treatment  Patient Details  Name: Howard [name] MRN: [id number] Date of Birth: August 12, 1931 Referring Provider: Dr. Natale Milch   Encounter Date: 12/30/2017  PT End of Session - 12/30/17 1006    Visit Number  5    Number of Visits  17    Date for PT Re-Evaluation  02/03/18    PT Start Time  4163    PT Stop Time  1100    PT Time Calculation (min)  45 min    Equipment Utilized During Treatment  Gait belt    Activity Tolerance  Patient tolerated treatment well but did have episodes of vertigo requiring rest break    Behavior During Therapy  [place] for tasks assessed/performed       Past Medical History:  Diagnosis Date  . Arthritis   . Bell palsy   . Cancer [facility])    prostate and skin  . Chronic combined systolic and diastolic CHF, NYHA class 1 ([place])    a. 07/2014 Echo: EF 35-40%, Gr 1 DD.  Marland Kitchen Complete heart block ([place])    a. 11/2010 s/p SJM 2210 Accent DC PPM, ser# [id number].  . Depression   . Diabetes mellitus without complication ([place])   . Fall 11-10-14  . GERD (gastroesophageal reflux disease)   . History of prostate cancer   . Hyperlipidemia   . Hypertension   . LBBB (left bundle branch block)   . Left-sided Bell's palsy   . Lung cancer ([place]) 2016  . NICM (nonischemic cardiomyopathy) ([place])    a. 07/2014 Echo: EF 35-40%, mid-apicalanteroseptal DK, Gr 1 DD, mild-mod dil LA.  . Non-obstructive CAD    a. 07/2014 Abnl MV;  b. 08/2014 Cath: LM nl, LAD 30p, RI 40p, LCX nl, OM1 40, RCA dominant 30p, 70d-->Med Rx.  Marland Kitchen Poor balance   . Presence of permanent cardiac pacemaker   . Sleep apnea    a. cpap  . Vertigo   . WPW (Wolff-Parkinson-White syndrome)    a. S/P RFCA 1991.    Past Surgical History:  Procedure Laterality Date  . APPLICATION OF WOUND VAC Left 06/07/2015   Procedure: APPLICATION OF WOUND VAC;  Surgeon:  Robert Bellow, MD;  Location: [facility] ORS;  Service: General;  Laterality: Left;  left upper back  . BACK SURGERY     2011  . CARDIAC CATHETERIZATION  08/26/2014   Single vessel obstructive CAD  . CARPAL TUNNEL RELEASE  04-04-15   [facility]  . CATARACT EXTRACTION  07-31-11 and 09-18-11  . Catheter ablation  1991   for WPW  . cervical fusion    . CHOLECYSTECTOMY  09-07-14  . HAND SURGERY     right 1993; left 2005  . HERNIA REPAIR  1955  . INSERT / REPLACE / REMOVE PACEMAKER    . JOINT REPLACEMENT Left 2013   knee  . JOINT REPLACEMENT Right 2004   knee  . KNEE SURGERY     left knee 1991 and 1992; right knee 1995  . LEFT HEART CATHETERIZATION WITH CORONARY ANGIOGRAM N/A 08/26/2014   Procedure: LEFT HEART CATHETERIZATION WITH CORONARY ANGIOGRAM;  Surgeon: [name], MD;  Location: [facility] CATH LAB;  Service: Cardiovascular;  Laterality: N/A;  . LUMBAR LAMINECTOMY/DECOMPRESSION MICRODISCECTOMY N/A 06/07/2014   Procedure: LUMBAR FOUR TO FIVE LUMBAR LAMINECTOMY/DECOMPRESSION MICRODISCECTOMY 1 LEVEL;  Surgeon: [name], MD;  Location:  [place] NEURO ORS;  Service: Neurosurgery;  Laterality: N/A;  . LUNG BIOPSY Right 2016   Dr [name]  . PACEMAKER INSERTION     PPM-- St Jude 11/30/10 by [name]  . PROSTATE SURGERY     cancer--1998, prostatectomy  . REPLACEMENT TOTAL KNEE     2004  . ruptured disc     1962 and 1998  . TRIGGER FINGER RELEASE  01-24-15  . WOUND DEBRIDEMENT Left 06/07/2015   Procedure: DEBRIDEMENT WOUND;  Surgeon: Robert Bellow, MD;  Location: [facility] ORS;  Service: General;  Laterality: Left;  left upper back    There were no vitals filed for this visit.  Subjective Assessment - 12/30/17 1006    Subjective  Patient states he has been all right this past week. Patient states his blood pressures have been better.     Pertinent History  Patient know to this Probation officer and patient has been seen several times since 2015 for repeat episodes of BPPV and balance difficulties. Patient states he was doing  pretty good until yesterday when he fell off the porch backwards hitting a brick wall. Patient states he did not hit his head, but did hit his left elbow. Patient states he had been reaching over to get something and states he had an episode of vertigo. Patient states he was having trouble walking in the last few days on and off and all day yesterday. Patient states he is using a SPC because he feels unsteady currently. Patient states when he woke up today he has been unstable walking but that it has been improving as the day goes on. Patient reports that he has been getting shots in his spine for arthritic pain. Patient states he has had several episodes of his left thigh locking up, but states this has not caused him to fall.     Patient Stated Goals  Patient would like to be able to lie in prone to shoot an air rifle, be able to walk without the cane and to have less dizziness and be more steady on his feet.    Currently in Pain?  No/denies       Therapeutic Exercise: Patient performed 10 minute warm-up on Octane fitness bike at level 5 with vital signs monitored pre and post. Heart rate was 58 bpm and oxygen saturation level was 98%. Patient denied back pain with Octane bike riding exercise.  2 sets of 15 reps holding #5 hand weights bilaterally squats 2 sets of 10 reps holding bilateral #5 hand weights alternating lunges 2 sets of 12 reps sidestepping L/R with squats in-between sidesteps 1 set of 15 reps high knee marching and 1 set of 12 reps 2 sets of 15 reps standing hip abduction with #2 ankle weights Patient's blood pressure was 141/46 mmHg. Provided patient with water and encouraged hydration. Patient's oxygen saturation level was 98% and HR was 73 bpm. Patient had difficulty with SLS on right leg with high knee marching and hip abd exercise. Patient required several short rest breaks secondary to left thigh muscle cramping during exercise.    PT Education - 12/30/17 1006    Education  provided  Yes    Education Details  discussed low blood pressure and discussed hydration; discussed strengthening exercises and will try Theraband leg exercises next session as patient has Therabands at home that he can use to exercise.    Person(s) Educated  Patient    Methods  Explanation;Demonstration    Comprehension  Verbalized understanding;Returned demonstration  PT Short Term Goals - 12/26/17 4010      PT SHORT TERM GOAL #1   Title  Patient will report 50% or greater improvement in his symptoms of dizziness and imbalance with provoking motions or positions.    Time  4    Period  Weeks    Status  Achieved        PT Long Term Goals - 12/26/17 0957      PT LONG TERM GOAL #1   Title  Patient will demonstrate reduced falls risk as evidenced by Dynamic Gait Index (DGI) >19/24 by 06/17/2017.    Baseline  scored 13/24 on12/18/2018    Time  8    Period  Weeks    Status  On-going      PT LONG TERM GOAL #2   Title  Patient will reduce perceived disability to low levels as indicated by <40 on Dizziness Handicap Inventory.    Baseline  scored 60/100 on 12/09/2017    Time  8    Period  Weeks    Status  On-going      PT LONG TERM GOAL #3   Title  Patient reports no vertigo with provoking motions or positions.    Time  8    Period  Weeks    Status  Achieved      PT LONG TERM GOAL #4   Title  Patient will have demonstrate decreased falls risk as indicated by Activities Specific Balance Confidence Scale score of 80% or greater.    Baseline  scored 36.2% on 12/09/2017    Time  8    Period  Weeks    Status  On-going      PT LONG TERM GOAL #5   Title  Pt will decrease 5TSTS by at least 3 seconds in order to demonstrate clinically significant improvement in LE strength    Baseline  12/12/17: 17.4s    Time  8    Period  Weeks    Status  On-going            Plan - 12/30/17 1006    Clinical Impression Statement  Patient continues to report no further episodes of  dizziness. Patient able to work on strengthening exercises this date. Patient did have to take several rest breaks secondary to cramping in his left thigh but denies onset of any back or leg pain. Patient reports that he has therabands at home so will plan on issuing LE exercises for HEP to work on strengthening. Encouraged patient to follow-up as indicated.     Rehab Potential  Fair    Clinical Impairments Affecting Rehab Potential  Positive Indicators: motivated, has improved with vestibular therapy in the past  Negative Indicators: chronicity, comorbidities    PT Frequency  2x / week 2 times a week for 2 weeks and then 1 time a week for 6 weeks    PT Duration  8 weeks    PT Treatment/Interventions  Canalith Repostioning;Neuromuscular re-education;Balance training;Therapeutic exercise;Therapeutic activities;Functional mobility training;Stair training;Gait training;Patient/family education;Vestibular    PT Next Visit Plan  balance and strength training; Octane bike, try Theraband exercises as patient has Theraband at home that he can use for HEP.     PT Home Exercise Plan  Sit to stand 2 x 10 BID, mini squats 2 x 10 BID, semitandem balance 2 x 10 bilateral    Consulted and Agree with Plan of Care  Patient       Patient will benefit from skilled  therapeutic intervention in order to improve the following deficits and impairments:  Decreased balance, Dizziness, Difficulty walking, Decreased strength, Decreased range of motion  Visit Diagnosis: Dizziness and giddiness  Difficulty in walking, not elsewhere classified  Repeated falls     Problem List Patient Active Problem List   Diagnosis Date Noted  . Malignant neoplasm of right lung ([place]) 04/18/2016  . CAD (coronary artery disease) 04/01/2016  . Adenocarcinoma ([place]) 04/01/2016  . Skin cyst 12/26/2015  . GERD (gastroesophageal reflux disease) 06/16/2015  . Abscess of back 06/06/2015  . Vertigo 03/27/2015  . Carpal tunnel syndrome  10/28/2014  . Status post cholecystectomy 09/29/2014  . Disease of digestive tract 09/29/2014  . Nonischemic cardiomyopathy ([place]) 09/26/2014  . Cardiomyopathy ([place]) 09/26/2014  . Diabetes mellitus without complication ([place])   . Sleep apnea   . Spinal stenosis, lumbar region, with neurogenic claudication 06/07/2014  . Lumbar stenosis with neurogenic claudication 06/07/2014  . Abnormal gait 08/20/2012  . H/O total knee replacement 08/20/2012  . Arthritis of knee, degenerative 08/20/2012  . Pacemaker-St.Jude 08/03/2012  . Cardiac conduction disorder 06/19/2012  . Acid reflux 06/18/2012  . 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 Triggering of digit 03/25/2012  . Hyperlipidemia 12/23/2011  . Essential hypertension 03/23/2011  . Complete heart block ([place]) 03/23/2011  . Complete atrioventricular block ([place]) 03/23/2011   Lady Deutscher PT, DPT [phone] Lady Deutscher 12/31/2017, 2:29 PM  [place] MAIN [facility] SERVICES [address] [place], [place], [address] Phone: [phone]   Fax:  [phone]  Name: Howard [name] MRN: [id number] Date of Birth: [birth year]

## 2017-12-31 ENCOUNTER — Telehealth: Payer: Self-pay | Admitting: Cardiovascular Disease

## 2017-12-31 ENCOUNTER — Ambulatory Visit (INDEPENDENT_AMBULATORY_CARE_PROVIDER_SITE_OTHER): Payer: Medicare Other | Admitting: *Deleted

## 2017-12-31 DIAGNOSIS — I442 Atrioventricular block, complete: Secondary | ICD-10-CM

## 2017-12-31 MED ORDER — SACUBITRIL-VALSARTAN 97-103 MG PO TABS: 1.0000 | ORAL_TABLET | Freq: Two times a day (BID) | ORAL | 3 refills | Status: DC

## 2017-12-31 NOTE — Progress Notes (Signed)
Remote pacemaker transmission.   

## 2017-12-31 NOTE — Telephone Encounter (Signed)
Patient brought list of blood pressure readings into office which were good. Patients wife states that he doubled his entresto to see if higher dose is needed. So they want to know if we should increase dosage because now they are running out of medication. Advised that I would review with Dr. [name]. Blood pressure readings were as follows:  12/18/17 144/84 12/19/17 142/80 12/20/17 122/68 12/21/17 147/60 12/22/17 128/78 12/24/17 144/76 12/25/17 134/70 12/26/17 122/66 12/27/17 120/67 12/28/17 130/76 12/29/17 [id number] 12/29/17 PM 128/70 12/30/17 123/65  Spoke with Dr. [name] and he gave verbal order to increase his Entresto to the next dosage of 97/103 mg twice a day. Reviewed information with patients wife per release form and she was appreciative for the call with no further questions.

## 2018-01-01 ENCOUNTER — Encounter: Payer: Self-pay | Admitting: Cardiology

## 2018-01-02 ENCOUNTER — Ambulatory Visit (INDEPENDENT_AMBULATORY_CARE_PROVIDER_SITE_OTHER): Payer: Medicare Other | Admitting: Family Medicine

## 2018-01-02 ENCOUNTER — Encounter: Payer: Self-pay | Admitting: Family Medicine

## 2018-01-02 VITALS — BP 102/54 | HR 78 | Temp 97.5°F | Wt 192.6 lb

## 2018-01-02 DIAGNOSIS — I442 Atrioventricular block, complete: Secondary | ICD-10-CM | POA: Diagnosis not present

## 2018-01-02 DIAGNOSIS — R42 Dizziness and giddiness: Secondary | ICD-10-CM | POA: Diagnosis not present

## 2018-01-02 DIAGNOSIS — I5042 Chronic combined systolic (congestive) and diastolic (congestive) heart failure: Secondary | ICD-10-CM | POA: Diagnosis not present

## 2018-01-02 DIAGNOSIS — E119 Type 2 diabetes mellitus without complications: Secondary | ICD-10-CM | POA: Diagnosis not present

## 2018-01-02 LAB — POCT GLYCOSYLATED HEMOGLOBIN (HGB A1C): Hemoglobin A1C: 7.3

## 2018-01-02 NOTE — Progress Notes (Signed)
Patient: Howard [name] Male    DOB: 04/15/1931   84 y.o.   MRN: [id number] Visit Date: 01/02/2018  Today's Provider: [name], PA   Chief Complaint  Patient presents with  . Diabetes  . Hypertension  . Hyperlipidemia  . Follow-up   Subjective:    HPI  Diabetes Mellitus Type II, Follow-up:   RecentLabs   Recent Labs       Lab Results  Component Value Date   HGBA1C 7.0 09/30/2017     Last seen for diabetes 3 monthsago.  Management since then includes continue Metformin 500 mg BID and Welchol 3.75 gm qd with diabetic diet and exercise. Hereports faircompliance with treatment.  Heis nothaving side effects.  Current symptoms include none Home blood sugar records: fasting range: 130-140's  Episodes of hypoglycemia? no  Current Insulin Regimen: none Weight trend: stable Current diet: restricting sweets some Current exercise: none due to back pain   ------------------------------------------------------------------------   Hypertension, follow-up:  BP Readings from Last 3 Encounters:  01/02/18 (!) 102/54  12/26/17 (!) 155/68  12/12/17 (!) 156/75    Hewas last seen for hypertension 3 monthsago.  BP at that visit was 138/74 Management since that visit includes continue HCTZ 25 mg qd, Metoprolol-SL 25 mg qd, diet and exercise. Patient reports Cardiologist discontinued Amlodipine 5 mg qd, Lisinopril 10 mg qd Hereports fair compliance with treatment. Heis nothaving side effects.  Heis notexercising. Heisadherent to low salt diet.  Outside blood pressures are not being checked. Heis experiencing none.  Patient denies chest pain, chest pressure/discomfort, irregular heart beat and palpitations.  Cardiovascular risk factors include advanced age (older than 23 for men, 87 for women), diabetes mellitus, dyslipidemia, hypertension and male gender.  Use of agents associated with hypertension: none.    ------------------------------------------------------------------------   Lipid/Cholesterol, Follow-up:   Last seen for this 3 monthsago.  Management since that visit includes continue Simvastatin 40 mg qd and Welchol 3.75 gm qd,low fat diet and exercise.  Last Lipid Panel: Labs(Brief   Lab Results  Component Value Date   CHOL 135 09/30/2017   HDL 48 09/30/2017   LDLCALC 70 02/18/2017   TRIG 154 (H) 09/30/2017   CHOLHDL 2.8 09/30/2017   Past Medical History:  Diagnosis Date  . Arthritis   . Bell palsy   . Cancer [facility])    prostate and skin  . Chronic combined systolic and diastolic CHF, NYHA class 1 ([place])    a. 07/2014 Echo: EF 35-40%, Gr 1 DD.  Marland Kitchen Complete heart block ([place])    a. 11/2010 s/p SJM 2210 Accent DC PPM, ser# [id number].  . Depression   . Diabetes mellitus without complication ([place])   . Fall 11-10-14  . GERD (gastroesophageal reflux disease)   . History of prostate cancer   . Hyperlipidemia   . Hypertension   . LBBB (left bundle branch block)   . Left-sided Bell's palsy   . Lung cancer ([place]) 2016  . NICM (nonischemic cardiomyopathy) ([place])    a. 07/2014 Echo: EF 35-40%, mid-apicalanteroseptal DK, Gr 1 DD, mild-mod dil LA.  . Non-obstructive CAD    a. 07/2014 Abnl MV;  b. 08/2014 Cath: LM nl, LAD 30p, RI 40p, LCX nl, OM1 40, RCA dominant 30p, 70d-->Med Rx.  Marland Kitchen Poor balance   . Presence of permanent cardiac pacemaker   . Sleep apnea    a. cpap  . Vertigo   . WPW (Wolff-Parkinson-White syndrome)    a. S/P RFCA 1991.  Past Surgical History:  Procedure Laterality Date  . APPLICATION OF WOUND VAC Left 06/07/2015   Procedure: APPLICATION OF WOUND VAC;  Surgeon: Robert Bellow, MD;  Location: [facility] ORS;  Service: General;  Laterality: Left;  left upper back  . BACK SURGERY     2011  . CARDIAC CATHETERIZATION  08/26/2014   Single vessel obstructive CAD  . CARPAL TUNNEL RELEASE  04-04-15   [facility]  . CATARACT EXTRACTION  07-31-11 and 09-18-11  . Catheter  ablation  1991   for WPW  . cervical fusion    . CHOLECYSTECTOMY  09-07-14  . HAND SURGERY     right 1993; left 2005  . HERNIA REPAIR  1955  . INSERT / REPLACE / REMOVE PACEMAKER    . JOINT REPLACEMENT Left 2013   knee  . JOINT REPLACEMENT Right 2004   knee  . KNEE SURGERY     left knee 1991 and 1992; right knee 1995  . LEFT HEART CATHETERIZATION WITH CORONARY ANGIOGRAM N/A 08/26/2014   Procedure: LEFT HEART CATHETERIZATION WITH CORONARY ANGIOGRAM;  Surgeon: [name], MD;  Location: [facility] CATH LAB;  Service: Cardiovascular;  Laterality: N/A;  . LUMBAR LAMINECTOMY/DECOMPRESSION MICRODISCECTOMY N/A 06/07/2014   Procedure: LUMBAR FOUR TO FIVE LUMBAR LAMINECTOMY/DECOMPRESSION MICRODISCECTOMY 1 LEVEL;  Surgeon: [name], MD;  Location: [place] NEURO ORS;  Service: Neurosurgery;  Laterality: N/A;  . LUNG BIOPSY Right 2016   Dr [name]  . PACEMAKER INSERTION     PPM-- St Jude 11/30/10 by [name]  . PROSTATE SURGERY     cancer--1998, prostatectomy  . REPLACEMENT TOTAL KNEE     2004  . ruptured disc     1962 and 1998  . TRIGGER FINGER RELEASE  01-24-15  . WOUND DEBRIDEMENT Left 06/07/2015   Procedure: DEBRIDEMENT WOUND;  Surgeon: Robert Bellow, MD;  Location: [facility] ORS;  Service: General;  Laterality: Left;  left upper back   Family History  Problem Relation Age of Onset  . Heart attack Mother   . Hyperlipidemia Mother   . CAD Unknown   . Prostate cancer Neg Hx    Allergies  Allergen Reactions  . Sulfa Antibiotics Rash  . Sulfonamide Derivatives Rash    Current Outpatient Medications:  .  aspirin 81 MG tablet, Take 81 mg by mouth daily.  , Disp: , Rfl:  .  Cholecalciferol (VITAMIN D3) 1000 UNITS CAPS, Take 1,000 Units by mouth daily. , Disp: , Rfl:  .  hydrochlorothiazide (HYDRODIURIL) 25 MG tablet, TAKE 1 TABLET DAILY, Disp: 90 tablet, Rfl: 3 .  metFORMIN (GLUCOPHAGE) 500 MG tablet, TAKE 1 TABLET TWICE A DAY  WITH MEALS, Disp: 180 tablet, Rfl: 3 .  metoprolol succinate (TOPROL-XL) 25  MG 24 hr tablet, Take 1 tablet (25 mg total) by mouth daily., Disp: 90 tablet, Rfl: 3 .  Multiple Vitamin (MULTIVITAMIN) capsule, Take 1 capsule by mouth daily.  , Disp: , Rfl:  .  omeprazole (PRILOSEC) 40 MG capsule, TAKE 1 CAPSULE TWICE DAILY (Patient taking differently: TAKE 1 CAPSULE ONCE DAILY), Disp: 180 capsule, Rfl: 3 .  psyllium (METAMUCIL) 58.6 % powder, Take 1 packet by mouth 3 (three) times daily., Disp: , Rfl:  .  ranitidine (ZANTAC) 300 MG capsule, Take 300 mg by mouth every evening., Disp: , Rfl:  .  sacubitril-valsartan (ENTRESTO) 97-103 MG, Take 1 tablet by mouth 2 (two) times daily., Disp: 180 tablet, Rfl: 3 .  simvastatin (ZOCOR) 40 MG tablet, TAKE 1 TABLET AT BEDTIME, Disp: 90  tablet, Rfl: 0  Review of Systems  Constitutional: Negative.   Respiratory: Negative.   Cardiovascular: Negative.   Endocrine: Negative.   Musculoskeletal: Negative.    Social History   Tobacco Use  . Smoking status: Former Smoker    Years: 4.00  . Smokeless tobacco: Never Used  . Tobacco comment: Quit 2011  Substance Use Topics  . Alcohol use: No    Frequency: Never   Objective:   BP (!) 102/54 (BP Location: Right Arm, Patient Position: Sitting, Cuff Size: Normal)   Pulse 78   Temp (!) 97.5 F (36.4 C) (Oral)   Wt 192 lb 9.6 oz (87.4 kg)   SpO2 95%   BMI 28.03 kg/m  Wt Readings from Last 3 Encounters:  01/02/18 192 lb 9.6 oz (87.4 kg)  11/25/17 191 lb 12 oz (87 kg)  10/31/17 193 lb 12.8 oz (87.9 kg)   Physical Exam  Constitutional: He is oriented to person, place, and time. He appears well-developed and well-nourished. No distress.  HENT:  Head: Normocephalic and atraumatic.  Right Ear: Hearing normal.  Left Ear: Hearing normal.  Nose: Nose normal.  Eyes: Conjunctivae and lids are normal. Right eye exhibits no discharge. Left eye exhibits no discharge. No scleral icterus.  Neck: Neck supple.  Cardiovascular: Normal rate and regular rhythm.  Pacemaker in left upper chest.   Pulmonary/Chest: Effort normal. No respiratory distress.  Abdominal: Soft. Bowel sounds are normal.  Neurological: He is alert and oriented to person, place, and time.  Skin: Skin is intact. No lesion and no rash noted.  Psychiatric: He has a normal mood and affect. His speech is normal and behavior is normal. Thought content normal.      Assessment & Plan:     1. Type 2 diabetes mellitus without complication, without long-term current use of insulin (HCC) Still taking the Metformin 500 mg BID and feels FBS at home has been stable. Hgb A1C on 09-30-17 was 7.0% - today 7.3%. Continues to follow diabetic diet. No recent polyuria, polydipsia, polyphagia or hypoglycemic episodes. Blood sugar 137, creatinine 0.73 with GFR 85 on 11-25-17. Encouraged to get ophthalmology evaluation this spring. Continue present medications and recheck in 3-4 months. - POCT glycosylated hemoglobin (Hb A1C)  2. Chronic combined systolic and diastolic heart failure ([place]) Followed by cardiologist (Dr. [name] and Caryl Comes). Most recent echo showed EF 35-40% and placed on Entresto with beta blocker. Feels he is having better endurance. Continue follow up with cardiologist.  3. Complete heart block ([place]) Followed by Dr. [name] and has a St. Jude Pacemaker for the past 5 years.  4. Vertigo Stable with continued physical therapy.       [name], PA  [facility]

## 2018-01-06 ENCOUNTER — Encounter: Payer: Self-pay | Admitting: Physical Therapy

## 2018-01-06 ENCOUNTER — Ambulatory Visit: Payer: Medicare Other | Admitting: Physical Therapy

## 2018-01-06 DIAGNOSIS — R262 Difficulty in walking, not elsewhere classified: Secondary | ICD-10-CM | POA: Diagnosis not present

## 2018-01-06 DIAGNOSIS — R296 Repeated falls: Secondary | ICD-10-CM | POA: Diagnosis not present

## 2018-01-06 DIAGNOSIS — R42 Dizziness and giddiness: Secondary | ICD-10-CM

## 2018-01-06 NOTE — Therapy (Signed)
[place] MAIN [facility] SERVICES [place], [place], [address] Phone: [phone]   Fax:  [phone]  Physical Therapy Treatment  Patient Details  Name: Howard [name] MRN: [id number] Date of Birth: August 02, 1931 Referring Provider: Dr. Natale Milch   Encounter Date: 01/06/2018  PT End of Session - 01/06/18 1019    Visit Number  6    Number of Visits  17    Date for PT Re-Evaluation  02/03/18    PT Start Time  1016    PT Stop Time  1102    PT Time Calculation (min)  46 min    Equipment Utilized During Treatment  Gait belt    Activity Tolerance  Patient tolerated treatment well but did have episodes of vertigo requiring rest break    Behavior During Therapy  [facility] for tasks assessed/performed       Past Medical History:  Diagnosis Date  . Arthritis   . Bell palsy   . Cancer [facility])    prostate and skin  . Chronic combined systolic and diastolic CHF, NYHA class 1 ([place])    a. 07/2014 Echo: EF 35-40%, Gr 1 DD.  Marland Kitchen Complete heart block ([place])    a. 11/2010 s/p SJM 2210 Accent DC PPM, ser# [id number].  . Depression   . Diabetes mellitus without complication ([place])   . Fall 11-10-14  . GERD (gastroesophageal reflux disease)   . History of prostate cancer   . Hyperlipidemia   . Hypertension   . LBBB (left bundle branch block)   . Left-sided Bell's palsy   . Lung cancer ([place]) 2016  . NICM (nonischemic cardiomyopathy) ([place])    a. 07/2014 Echo: EF 35-40%, mid-apicalanteroseptal DK, Gr 1 DD, mild-mod dil LA.  . Non-obstructive CAD    a. 07/2014 Abnl MV;  b. 08/2014 Cath: LM nl, LAD 30p, RI 40p, LCX nl, OM1 40, RCA dominant 30p, 70d-->Med Rx.  Marland Kitchen Poor balance   . Presence of permanent cardiac pacemaker   . Sleep apnea    a. cpap  . Vertigo   . WPW (Wolff-Parkinson-White syndrome)    a. S/P RFCA 1991.    Past Surgical History:  Procedure Laterality Date  . APPLICATION OF WOUND VAC Left 06/07/2015   Procedure: APPLICATION OF WOUND VAC;  Surgeon:  Robert Bellow, MD;  Location: [facility] ORS;  Service: General;  Laterality: Left;  left upper back  . BACK SURGERY     2011  . CARDIAC CATHETERIZATION  08/26/2014   Single vessel obstructive CAD  . CARPAL TUNNEL RELEASE  04-04-15   [facility]  . CATARACT EXTRACTION  07-31-11 and 09-18-11  . Catheter ablation  1991   for WPW  . cervical fusion    . CHOLECYSTECTOMY  09-07-14  . HAND SURGERY     right 1993; left 2005  . HERNIA REPAIR  1955  . INSERT / REPLACE / REMOVE PACEMAKER    . JOINT REPLACEMENT Left 2013   knee  . JOINT REPLACEMENT Right 2004   knee  . KNEE SURGERY     left knee 1991 and 1992; right knee 1995  . LEFT HEART CATHETERIZATION WITH CORONARY ANGIOGRAM N/A 08/26/2014   Procedure: LEFT HEART CATHETERIZATION WITH CORONARY ANGIOGRAM;  Surgeon: [name], MD;  Location: [facility] CATH LAB;  Service: Cardiovascular;  Laterality: N/A;  . LUMBAR LAMINECTOMY/DECOMPRESSION MICRODISCECTOMY N/A 06/07/2014   Procedure: LUMBAR FOUR TO FIVE LUMBAR LAMINECTOMY/DECOMPRESSION MICRODISCECTOMY 1 LEVEL;  Surgeon: [name], MD;  Location:  [place] NEURO ORS;  Service: Neurosurgery;  Laterality: N/A;  . LUNG BIOPSY Right 2016   Dr [name]  . PACEMAKER INSERTION     PPM-- St Jude 11/30/10 by [name]  . PROSTATE SURGERY     cancer--1998, prostatectomy  . REPLACEMENT TOTAL KNEE     2004  . ruptured disc     1962 and 1998  . TRIGGER FINGER RELEASE  01-24-15  . WOUND DEBRIDEMENT Left 06/07/2015   Procedure: DEBRIDEMENT WOUND;  Surgeon: Robert Bellow, MD;  Location: [facility] ORS;  Service: General;  Laterality: Left;  left upper back    There were no vitals filed for this visit.  Subjective Assessment - 01/06/18 1018    Subjective  Patient reports during session this date that he can feel his legs getting tired and that he has gotten weaker in his legs as he had stopped exercising when he was having issues with his back. Patient states that he is glad that he is able to work on strengthening his legs again. Patient  reports that his blood pressure has been better this past week after they adjusted his medication a few weeks ago. Patient states that he noticed that he has been walking straighter in the house without veering.     Pertinent History  Patient know to this Probation officer and patient has been seen several times since 2015 for repeat episodes of BPPV and balance difficulties. Patient states he was doing pretty good until yesterday when he fell off the porch backwards hitting a brick wall. Patient states he did not hit his head, but did hit his left elbow. Patient states he had been reaching over to get something and states he had an episode of vertigo. Patient states he was having trouble walking in the last few days on and off and all day yesterday. Patient states he is using a SPC because he feels unsteady currently. Patient states when he woke up today he has been unstable walking but that it has been improving as the day goes on. Patient reports that he has been getting shots in his spine for arthritic pain. Patient states he has had several episodes of his left thigh locking up, but states this has not caused him to fall.     Patient Stated Goals  Patient would like to be able to lie in prone to shoot an air rifle, be able to walk without the cane and to have less dizziness and be more steady on his feet.    Currently in Pain?  No/denies       Therapeutic Exercise:  Performed in sitting with green Theraband 15 reps with 3 second holds isolated hip ABD, clamshells and with red Theraband seated hip flexion. Performed in standing with green Theraband 15 reps with 3 second holds hip extension and with red Theraband 15 reps 3 second holds hip abduction and hip adduction. Patient required verbal cuing for form to stand erect, to keep knee straight and leg in neutral without ER during standing therapeutic exercises and to maintain the 3 second holds throughout. Patient reports that he feels fatigue in his legs with  the exercises and stated that he felt like his left leg wanted to start cramping after the standing exercises but it did not. Discussed with patient that by alternating left and right leg and doing one set of all the exercises and then going back and repeating a second set it allowed each different muscle group to be exercised without overly fatiguing  one group which often times is what brings on his left leg muscle cramping issues. Patient reports that he has these Theraband colors at home.  Issued and reviewed as to safety and how to perform Theraband exercises. Handout provided.   PT Education - 01/06/18 1018    Education provided  Yes    Education Details  issued from [employer] website the following Theraband exercises for HEP: seated clamshells, hip flexion and in standing 4 way hip-hip flexion, extension and hip ABD and ADD    Person(s) Educated  Patient    Methods  Explanation;Demonstration;Handout;Verbal cues    Comprehension  Verbalized understanding;Returned demonstration       PT Short Term Goals - 12/26/17 0958      PT SHORT TERM GOAL #1   Title  Patient will report 50% or greater improvement in his symptoms of dizziness and imbalance with provoking motions or positions.    Time  4    Period  Weeks    Status  Achieved        PT Long Term Goals - 12/26/17 0957      PT LONG TERM GOAL #1   Title  Patient will demonstrate reduced falls risk as evidenced by Dynamic Gait Index (DGI) >19/24 by 06/17/2017.    Baseline  scored 13/24 on12/18/2018    Time  8    Period  Weeks    Status  On-going      PT LONG TERM GOAL #2   Title  Patient will reduce perceived disability to low levels as indicated by <40 on Dizziness Handicap Inventory.    Baseline  scored 60/100 on 12/09/2017    Time  8    Period  Weeks    Status  On-going      PT LONG TERM GOAL #3   Title  Patient reports no vertigo with provoking motions or positions.    Time  8    Period  Weeks    Status  Achieved       PT LONG TERM GOAL #4   Title  Patient will have demonstrate decreased falls risk as indicated by Activities Specific Balance Confidence Scale score of 80% or greater.    Baseline  scored 36.2% on 12/09/2017    Time  8    Period  Weeks    Status  On-going      PT LONG TERM GOAL #5   Title  Pt will decrease 5TSTS by at least 3 seconds in order to demonstrate clinically significant improvement in LE strength    Baseline  12/12/17: 17.4s    Time  8    Period  Weeks    Status  On-going            Plan - 01/06/18 1019    Clinical Impression Statement  Patient reports that he has been doing better at home and has noticed that he is walking straighter. In addtion, he reports that he has been able to walk up and down the incline of his driveway to take out the trash can without difficulty as patient reports prior to therapy he was having to stop and take rest breaks and his legs would cramp. Patient worked on LEs strengthening exercises this date. Patient reported that he has Therabands at home and therefore issued seated and standing LEs Theraband exercises for HEP this date. Patient would benefit from continued PT services to further address goals and functional deficits.     Rehab Potential  Fair  Clinical Impairments Affecting Rehab Potential  Positive Indicators: motivated, has improved with vestibular therapy in the past  Negative Indicators: chronicity, comorbidities    PT Frequency  2x / week 2 times a week for 2 weeks and then 1 time a week for 6 weeks    PT Duration  8 weeks    PT Treatment/Interventions  Canalith Repostioning;Neuromuscular re-education;Balance training;Therapeutic exercise;Therapeutic activities;Functional mobility training;Stair training;Gait training;Patient/family education;Vestibular    PT Next Visit Plan  balance and strength training; Octane bike, try Theraband exercises as patient has Theraband at home that he can use for HEP. Update goals    PT Home Exercise  Plan  Sit to stand 2 x 10 BID, mini squats 2 x 10 BID, semitandem balance 2 x 10 bilateral; Theraband exercises-setated clamshells and hip flexion and standing 4 way hip    Consulted and Agree with Plan of Care  Patient       Patient will benefit from skilled therapeutic intervention in order to improve the following deficits and impairments:  Decreased balance, Dizziness, Difficulty walking, Decreased strength, Decreased range of motion  Visit Diagnosis: Dizziness and giddiness  Difficulty in walking, not elsewhere classified  Repeated falls     Problem List Patient Active Problem List   Diagnosis Date Noted  . Malignant neoplasm of right lung ([place]) 04/18/2016  . CAD (coronary artery disease) 04/01/2016  . Adenocarcinoma ([place]) 04/01/2016  . Skin cyst 12/26/2015  . GERD (gastroesophageal reflux disease) 06/16/2015  . Abscess of back 06/06/2015  . Vertigo 03/27/2015  . Carpal tunnel syndrome 10/28/2014  . Status post cholecystectomy 09/29/2014  . Disease of digestive tract 09/29/2014  . Cardiomyopathy ([place]) 09/26/2014  . Cardiomyopathy ([place]) 09/26/2014  . Type 2 diabetes mellitus without complications ([place])   . Sleep apnea   . Spinal stenosis, lumbar region, with neurogenic claudication 06/07/2014  . Lumbar stenosis with neurogenic claudication 06/07/2014  . Abnormal gait 08/20/2012  . H/O total knee replacement 08/20/2012  . Arthritis of knee, degenerative 08/20/2012  . Pacemaker-St.Jude 08/03/2012  . Cardiac conduction disorder 06/19/2012  . Acid reflux 06/18/2012  . 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 Triggering of digit 03/25/2012  . Hyperlipidemia 12/23/2011  . Essential hypertension 03/23/2011  . Atrioventricular block, complete ([place]) 03/23/2011  . Complete atrioventricular block ([place]) 03/23/2011   Lady Deutscher PT, DPT [phone] Lady Deutscher 01/06/2018, 11:39 AM  [place] MAIN [facility] SERVICES [address]  [place], [place], [address] Phone: [phone]   Fax:  [phone]  Name: TAYO MAUTE MRN: [id number] Date of Birth: [birth year]

## 2018-01-08 DIAGNOSIS — Z08 Encounter for follow-up examination after completed treatment for malignant neoplasm: Secondary | ICD-10-CM | POA: Diagnosis not present

## 2018-01-08 DIAGNOSIS — C44319 Basal cell carcinoma of skin of other parts of face: Secondary | ICD-10-CM | POA: Diagnosis not present

## 2018-01-08 DIAGNOSIS — D045 Carcinoma in situ of skin of trunk: Secondary | ICD-10-CM | POA: Diagnosis not present

## 2018-01-08 DIAGNOSIS — X32XXXA Exposure to sunlight, initial encounter: Secondary | ICD-10-CM | POA: Diagnosis not present

## 2018-01-08 DIAGNOSIS — Z85828 Personal history of other malignant neoplasm of skin: Secondary | ICD-10-CM | POA: Diagnosis not present

## 2018-01-08 DIAGNOSIS — L570 Actinic keratosis: Secondary | ICD-10-CM | POA: Diagnosis not present

## 2018-01-08 DIAGNOSIS — D485 Neoplasm of uncertain behavior of skin: Secondary | ICD-10-CM | POA: Diagnosis not present

## 2018-01-08 LAB — CUP PACEART REMOTE DEVICE CHECK
Battery Remaining Percentage: 22 %
Brady Statistic AP VP Percent: 2.7 %
Brady Statistic AP VS Percent: 1 %
Brady Statistic AS VP Percent: 97 %
Brady Statistic AS VS Percent: 1 %
Brady Statistic RV Percent Paced: 99 %
Implantable Lead Implant Date: 20111208
Implantable Lead Location: 753860
Implantable Lead Model: 1948
Lead Channel Impedance Value: 350 Ohm
Lead Channel Pacing Threshold Amplitude: 0.75 V
Lead Channel Pacing Threshold Pulse Width: 0.8 ms
Lead Channel Sensing Intrinsic Amplitude: 12 mV
Lead Channel Setting Pacing Amplitude: 2 V
Lead Channel Setting Pacing Pulse Width: 0.8 ms
Lead Channel Setting Sensing Sensitivity: 4 mV
MDC IDC LEAD IMPLANT DT: 20111208
MDC IDC LEAD LOCATION: 753859
MDC IDC MSMT BATTERY REMAINING LONGEVITY: 20 mo
MDC IDC MSMT BATTERY VOLTAGE: 2.78 V
MDC IDC MSMT LEADCHNL RA IMPEDANCE VALUE: 440 Ohm
MDC IDC MSMT LEADCHNL RA PACING THRESHOLD AMPLITUDE: 0.75 V
MDC IDC MSMT LEADCHNL RA PACING THRESHOLD PULSEWIDTH: 0.4 ms
MDC IDC MSMT LEADCHNL RA SENSING INTR AMPL: 3.9 mV
MDC IDC PG IMPLANT DT: 20111208
MDC IDC SESS DTM: 20190109070024
MDC IDC SET LEADCHNL RV PACING AMPLITUDE: 2.5 V
MDC IDC STAT BRADY RA PERCENT PACED: 2.3 %
Pulse Gen Model: 2210
Pulse Gen Serial Number: 7196739

## 2018-01-13 ENCOUNTER — Encounter: Payer: Medicare Other | Admitting: Physical Therapy

## 2018-01-16 ENCOUNTER — Ambulatory Visit: Payer: Medicare Other | Admitting: Physical Therapy

## 2018-01-16 DIAGNOSIS — K5909 Other constipation: Secondary | ICD-10-CM | POA: Diagnosis not present

## 2018-01-16 DIAGNOSIS — K219 Gastro-esophageal reflux disease without esophagitis: Secondary | ICD-10-CM | POA: Diagnosis not present

## 2018-01-16 DIAGNOSIS — K5900 Constipation, unspecified: Secondary | ICD-10-CM | POA: Diagnosis not present

## 2018-01-20 ENCOUNTER — Ambulatory Visit: Payer: Medicare Other

## 2018-01-20 VITALS — BP 156/68 | HR 73

## 2018-01-20 DIAGNOSIS — R262 Difficulty in walking, not elsewhere classified: Secondary | ICD-10-CM | POA: Diagnosis not present

## 2018-01-20 DIAGNOSIS — R296 Repeated falls: Secondary | ICD-10-CM | POA: Diagnosis not present

## 2018-01-20 DIAGNOSIS — R42 Dizziness and giddiness: Secondary | ICD-10-CM

## 2018-01-20 NOTE — Therapy (Signed)
[place] MAIN [facility] SERVICES [place], [place], [address] Phone: [phone]   Fax:  [phone]  Physical Therapy Treatment  Patient Details  Name: Howard [name] MRN: [id number] Date of Birth: 05/22/1931 Referring Provider: Dr. Natale Milch   Encounter Date: 01/20/2018  PT End of Session - 01/20/18 1054    Visit Number  7    Number of Visits  17    Date for PT Re-Evaluation  02/03/18    PT Start Time  1045    PT Stop Time  1130    PT Time Calculation (min)  45 min    Equipment Utilized During Treatment  Gait belt    Activity Tolerance  Patient tolerated treatment well but did have episodes of vertigo requiring rest break    Behavior During Therapy  [facility] for tasks assessed/performed       Past Medical History:  Diagnosis Date  . Arthritis   . Bell palsy   . Cancer [facility])    prostate and skin  . Chronic combined systolic and diastolic CHF, NYHA class 1 ([place])    a. 07/2014 Echo: EF 35-40%, Gr 1 DD.  Marland Kitchen Complete heart block ([place])    a. 11/2010 s/p SJM 2210 Accent DC PPM, ser# [id number].  . Depression   . Diabetes mellitus without complication ([place])   . Fall 11-10-14  . GERD (gastroesophageal reflux disease)   . History of prostate cancer   . Hyperlipidemia   . Hypertension   . LBBB (left bundle branch block)   . Left-sided Bell's palsy   . Lung cancer ([place]) 2016  . NICM (nonischemic cardiomyopathy) ([place])    a. 07/2014 Echo: EF 35-40%, mid-apicalanteroseptal DK, Gr 1 DD, mild-mod dil LA.  . Non-obstructive CAD    a. 07/2014 Abnl MV;  b. 08/2014 Cath: LM nl, LAD 30p, RI 40p, LCX nl, OM1 40, RCA dominant 30p, 70d-->Med Rx.  Marland Kitchen Poor balance   . Presence of permanent cardiac pacemaker   . Sleep apnea    a. cpap  . Vertigo   . WPW (Wolff-Parkinson-White syndrome)    a. S/P RFCA 1991.    Past Surgical History:  Procedure Laterality Date  . APPLICATION OF WOUND VAC Left 06/07/2015   Procedure: APPLICATION OF WOUND VAC;  Surgeon:  Robert Bellow, MD;  Location: [facility] ORS;  Service: General;  Laterality: Left;  left upper back  . BACK SURGERY     2011  . CARDIAC CATHETERIZATION  08/26/2014   Single vessel obstructive CAD  . CARPAL TUNNEL RELEASE  04-04-15   [facility]  . CATARACT EXTRACTION  07-31-11 and 09-18-11  . Catheter ablation  1991   for WPW  . cervical fusion    . CHOLECYSTECTOMY  09-07-14  . HAND SURGERY     right 1993; left 2005  . HERNIA REPAIR  1955  . INSERT / REPLACE / REMOVE PACEMAKER    . JOINT REPLACEMENT Left 2013   knee  . JOINT REPLACEMENT Right 2004   knee  . KNEE SURGERY     left knee 1991 and 1992; right knee 1995  . LEFT HEART CATHETERIZATION WITH CORONARY ANGIOGRAM N/A 08/26/2014   Procedure: LEFT HEART CATHETERIZATION WITH CORONARY ANGIOGRAM;  Surgeon: [name], MD;  Location: [facility] CATH LAB;  Service: Cardiovascular;  Laterality: N/A;  . LUMBAR LAMINECTOMY/DECOMPRESSION MICRODISCECTOMY N/A 06/07/2014   Procedure: LUMBAR FOUR TO FIVE LUMBAR LAMINECTOMY/DECOMPRESSION MICRODISCECTOMY 1 LEVEL;  Surgeon: [name], MD;  Location:  [place] NEURO ORS;  Service: Neurosurgery;  Laterality: N/A;  . LUNG BIOPSY Right 2016   Dr [name]  . PACEMAKER INSERTION     PPM-- St Jude 11/30/10 by [name]  . PROSTATE SURGERY     cancer--1998, prostatectomy  . REPLACEMENT TOTAL KNEE     2004  . ruptured disc     1962 and 1998  . TRIGGER FINGER RELEASE  01-24-15  . WOUND DEBRIDEMENT Left 06/07/2015   Procedure: DEBRIDEMENT WOUND;  Surgeon: Robert Bellow, MD;  Location: [facility] ORS;  Service: General;  Laterality: Left;  left upper back    Vitals:   01/20/18 1051  BP: (!) 156/68  Pulse: 73  SpO2: 98%    Subjective Assessment - 01/20/18 1052    Subjective  Pt states that he had two episodes of vertigo since his last session. They weren't as severe as prior and passed within a couple seconds. Otherwise pt reports that he is doing well. No specific questions at this time. HEP is going well. He is enjoying the  strengthening exercises during therapy sessions.     Pertinent History  Patient know to this Probation officer and patient has been seen several times since 2015 for repeat episodes of BPPV and balance difficulties. Patient states he was doing pretty good until yesterday when he fell off the porch backwards hitting a brick wall. Patient states he did not hit his head, but did hit his left elbow. Patient states he had been reaching over to get something and states he had an episode of vertigo. Patient states he was having trouble walking in the last few days on and off and all day yesterday. Patient states he is using a SPC because he feels unsteady currently. Patient states when he woke up today he has been unstable walking but that it has been improving as the day goes on. Patient reports that he has been getting shots in his spine for arthritic pain. Patient states he has had several episodes of his left thigh locking up, but states this has not caused him to fall.     Patient Stated Goals  Patient would like to be able to lie in prone to shoot an air rifle, be able to walk without the cane and to have less dizziness and be more steady on his feet.    Currently in Pain?  No/denies        TREATMENT  Ther-ex  NuStep L3 x 5 minutes for warm-up during history (3 minutes unbilled); Quantum leg press 105# x 15, 120# x 15, 135# x 15, pt without significant fatigue by end of repetitions on 135#, can increase at next session; Reviewed HEP ther-ex and performed: Seated marches with green tband x 10 bilateral; Seated clams with green tband x 10 bilateral; Standing hip 4 ways with green tband: extension x 10, flexion x 10, abduction x 10, and adduction x 10 all performed bilaterally; Kettle bell goblet squats 15# x 10, pt reports some "locking" of his left quad during the last rep so second set deferred;  Patient required several short rest breaks secondary to visible fatigue;    Neuromuscular  Re-education: Dix-hallpike tested on inverted mat table and negative bilaterally. No nystagmus observed or vertigo reported; Worked on step up/step down on 5" wooden step without UEs support 10 reps bilateral alternating LE; Airex balance with toe taps to 5" step without UE support; 1/2 foam roller, flat side up, balance in A/P direction, 30s x 3 bouts, very challenging  for patient with frequent posterior LOB; 1/2 foam roller, flat side up, tandem balance x 30s with each LE forward;                      PT Education - 01/20/18 1054    Education provided  Yes    Education Details  Exercise form/technique, reviewed HEP with patient    Person(s) Educated  Patient    Methods  Explanation    Comprehension  Verbalized understanding       PT Short Term Goals - 12/26/17 0958      PT SHORT TERM GOAL #1   Title  Patient will report 50% or greater improvement in his symptoms of dizziness and imbalance with provoking motions or positions.    Time  4    Period  Weeks    Status  Achieved        PT Long Term Goals - 12/26/17 0957      PT LONG TERM GOAL #1   Title  Patient will demonstrate reduced falls risk as evidenced by Dynamic Gait Index (DGI) >19/24 by 06/17/2017.    Baseline  scored 13/24 on12/18/2018    Time  8    Period  Weeks    Status  On-going      PT LONG TERM GOAL #2   Title  Patient will reduce perceived disability to low levels as indicated by <40 on Dizziness Handicap Inventory.    Baseline  scored 60/100 on 12/09/2017    Time  8    Period  Weeks    Status  On-going      PT LONG TERM GOAL #3   Title  Patient reports no vertigo with provoking motions or positions.    Time  8    Period  Weeks    Status  Achieved      PT LONG TERM GOAL #4   Title  Patient will have demonstrate decreased falls risk as indicated by Activities Specific Balance Confidence Scale score of 80% or greater.    Baseline  scored 36.2% on 12/09/2017    Time  8    Period   Weeks    Status  On-going      PT LONG TERM GOAL #5   Title  Pt will decrease 5TSTS by at least 3 seconds in order to demonstrate clinically significant improvement in LE strength    Baseline  12/12/17: 17.4s    Time  8    Period  Weeks    Status  On-going            Plan - 01/20/18 1055    Clinical Impression Statement  Pt demonstrates good motivation with session and really enjoys leg press. No exaccerbation of back pain with this exercise. Pt encouraged to add to his [employer] routine and discussed how to progress to 80% on 1RM. Dix-Hallpike testing is negative bilaterally today so no indication to treat with Epley. Pt able to complete balance exercises with therapist. He demonstrates increased weakness in LLE during step-ups. Pt will benefit from continued PT services for balance training and strengthening.     Rehab Potential  Fair    Clinical Impairments Affecting Rehab Potential  Positive Indicators: motivated, has improved with vestibular therapy in the past  Negative Indicators: chronicity, comorbidities    PT Frequency  2x / week 2 times a week for 2 weeks and then 1 time a week for 6 weeks    PT Duration  8 weeks    PT Treatment/Interventions  Canalith Repostioning;Neuromuscular re-education;Balance training;Therapeutic exercise;Therapeutic activities;Functional mobility training;Stair training;Gait training;Patient/family education;Vestibular    PT Next Visit Plan  balance and strength training; Octane bike, try Theraband exercises as patient has Theraband at home that he can use for HEP. Update goals    PT Home Exercise Plan  Sit to stand 2 x 10 BID, mini squats 2 x 10 BID, semitandem balance 2 x 10 bilateral; Theraband exercises-setated clamshells and hip flexion and standing 4 way hip    Consulted and Agree with Plan of Care  Patient       Patient will benefit from skilled therapeutic intervention in order to improve the following deficits and impairments:  Decreased  balance, Dizziness, Difficulty walking, Decreased strength, Decreased range of motion  Visit Diagnosis: Dizziness and giddiness  Difficulty in walking, not elsewhere classified     Problem List Patient Active Problem List   Diagnosis Date Noted  . Malignant neoplasm of right lung ([place]) 04/18/2016  . CAD (coronary artery disease) 04/01/2016  . Adenocarcinoma ([place]) 04/01/2016  . Skin cyst 12/26/2015  . GERD (gastroesophageal reflux disease) 06/16/2015  . Abscess of back 06/06/2015  . Vertigo 03/27/2015  . Carpal tunnel syndrome 10/28/2014  . Status post cholecystectomy 09/29/2014  . Disease of digestive tract 09/29/2014  . Cardiomyopathy ([place]) 09/26/2014  . Cardiomyopathy ([place]) 09/26/2014  . Type 2 diabetes mellitus without complications ([place])   . Sleep apnea   . Spinal stenosis, lumbar region, with neurogenic claudication 06/07/2014  . Lumbar stenosis with neurogenic claudication 06/07/2014  . Abnormal gait 08/20/2012  . H/O total knee replacement 08/20/2012  . Arthritis of knee, degenerative 08/20/2012  . Pacemaker-St.Jude 08/03/2012  . Cardiac conduction disorder 06/19/2012  . Acid reflux 06/18/2012  . 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 Triggering of digit 03/25/2012  . Hyperlipidemia 12/23/2011  . Essential hypertension 03/23/2011  . Atrioventricular block, complete ([place]) 03/23/2011  . Complete atrioventricular block ([place]) 03/23/2011   Phillips Grout PT, DPT   Bonetta Mostek 01/20/2018, 11:45 AM  [place] MAIN [facility] SERVICES [address] [place], [place], [address] Phone: [phone]   Fax:  [phone]  Name: Howard [name] MRN: [id number] Date of Birth: 1931-04-16

## 2018-01-23 ENCOUNTER — Ambulatory Visit: Payer: Medicare Other | Attending: Family Medicine

## 2018-01-23 VITALS — BP 144/65 | HR 80

## 2018-01-23 DIAGNOSIS — R262 Difficulty in walking, not elsewhere classified: Secondary | ICD-10-CM | POA: Diagnosis not present

## 2018-01-23 DIAGNOSIS — R296 Repeated falls: Secondary | ICD-10-CM | POA: Insufficient documentation

## 2018-01-23 DIAGNOSIS — R42 Dizziness and giddiness: Secondary | ICD-10-CM | POA: Insufficient documentation

## 2018-01-23 NOTE — Therapy (Signed)
[place] MAIN [facility] SERVICES [place], [place], [address] Phone: [phone]   Fax:  [phone]  Physical Therapy Treatment  Patient Details  Name: Howard [name] MRN: [id number] Date of Birth: Nov 19, 1931 Referring Provider: Dr. Natale Milch   Encounter Date: 01/23/2018  PT End of Session - 01/23/18 0921    Visit Number  8    Number of Visits  17    Date for PT Re-Evaluation  02/03/18    PT Start Time  0810    PT Stop Time  0900    PT Time Calculation (min)  50 min    Equipment Utilized During Treatment  Gait belt    Activity Tolerance  Patient tolerated treatment well but did have episodes of vertigo requiring rest break    Behavior During Therapy  [facility] for tasks assessed/performed       Past Medical History:  Diagnosis Date  . Arthritis   . Bell palsy   . Cancer [facility])    prostate and skin  . Chronic combined systolic and diastolic CHF, NYHA class 1 ([place])    a. 07/2014 Echo: EF 35-40%, Gr 1 DD.  Marland Kitchen Complete heart block ([place])    a. 11/2010 s/p SJM 2210 Accent DC PPM, ser# [id number].  . Depression   . Diabetes mellitus without complication ([place])   . Fall 11-10-14  . GERD (gastroesophageal reflux disease)   . History of prostate cancer   . Hyperlipidemia   . Hypertension   . LBBB (left bundle branch block)   . Left-sided Bell's palsy   . Lung cancer ([place]) 2016  . NICM (nonischemic cardiomyopathy) ([place])    a. 07/2014 Echo: EF 35-40%, mid-apicalanteroseptal DK, Gr 1 DD, mild-mod dil LA.  . Non-obstructive CAD    a. 07/2014 Abnl MV;  b. 08/2014 Cath: LM nl, LAD 30p, RI 40p, LCX nl, OM1 40, RCA dominant 30p, 70d-->Med Rx.  Marland Kitchen Poor balance   . Presence of permanent cardiac pacemaker   . Sleep apnea    a. cpap  . Vertigo   . WPW (Wolff-Parkinson-White syndrome)    a. S/P RFCA 1991.    Past Surgical History:  Procedure Laterality Date  . APPLICATION OF WOUND VAC Left 06/07/2015   Procedure: APPLICATION OF WOUND VAC;  Surgeon:  Robert Bellow, MD;  Location: [facility] ORS;  Service: General;  Laterality: Left;  left upper back  . BACK SURGERY     2011  . CARDIAC CATHETERIZATION  08/26/2014   Single vessel obstructive CAD  . CARPAL TUNNEL RELEASE  04-04-15   [facility]  . CATARACT EXTRACTION  07-31-11 and 09-18-11  . Catheter ablation  1991   for WPW  . cervical fusion    . CHOLECYSTECTOMY  09-07-14  . HAND SURGERY     right 1993; left 2005  . HERNIA REPAIR  1955  . INSERT / REPLACE / REMOVE PACEMAKER    . JOINT REPLACEMENT Left 2013   knee  . JOINT REPLACEMENT Right 2004   knee  . KNEE SURGERY     left knee 1991 and 1992; right knee 1995  . LEFT HEART CATHETERIZATION WITH CORONARY ANGIOGRAM N/A 08/26/2014   Procedure: LEFT HEART CATHETERIZATION WITH CORONARY ANGIOGRAM;  Surgeon: [name], MD;  Location: [facility] CATH LAB;  Service: Cardiovascular;  Laterality: N/A;  . LUMBAR LAMINECTOMY/DECOMPRESSION MICRODISCECTOMY N/A 06/07/2014   Procedure: LUMBAR FOUR TO FIVE LUMBAR LAMINECTOMY/DECOMPRESSION MICRODISCECTOMY 1 LEVEL;  Surgeon: [name], MD;  Location:  [place] NEURO ORS;  Service: Neurosurgery;  Laterality: N/A;  . LUNG BIOPSY Right 2016   Dr [name]  . PACEMAKER INSERTION     PPM-- St Jude 11/30/10 by [name]  . PROSTATE SURGERY     cancer--1998, prostatectomy  . REPLACEMENT TOTAL KNEE     2004  . ruptured disc     1962 and 1998  . TRIGGER FINGER RELEASE  01-24-15  . WOUND DEBRIDEMENT Left 06/07/2015   Procedure: DEBRIDEMENT WOUND;  Surgeon: Robert Bellow, MD;  Location: [facility] ORS;  Service: General;  Laterality: Left;  left upper back    Vitals:   01/23/18 0814  BP: (!) 144/65  Pulse: 80  SpO2: 98%    Subjective Assessment - 01/23/18 0814    Subjective  Pt reports that he is doing well on this date. No LLE pain today and he reports that he was able to walk all the way down to the rehab gym without stopping. No reported post-exercise soreness following last session. No specific questions or concerns. He went to  [employer] yesterday but did not try to use the leg press machine    Pertinent History  Patient know to this Probation officer and patient has been seen several times since 2015 for repeat episodes of BPPV and balance difficulties. Patient states he was doing pretty good until yesterday when he fell off the porch backwards hitting a brick wall. Patient states he did not hit his head, but did hit his left elbow. Patient states he had been reaching over to get something and states he had an episode of vertigo. Patient states he was having trouble walking in the last few days on and off and all day yesterday. Patient states he is using a SPC because he feels unsteady currently. Patient states when he woke up today he has been unstable walking but that it has been improving as the day goes on. Patient reports that he has been getting shots in his spine for arthritic pain. Patient states he has had several episodes of his left thigh locking up, but states this has not caused him to fall.     Patient Stated Goals  Patient would like to be able to lie in prone to shoot an air rifle, be able to walk without the cane and to have less dizziness and be more steady on his feet.    Currently in Pain?  No/denies           TREATMENT  Ther-ex  NuStep L3 x 5 minutes for warm-up during history (4 minutes unbilled); Quantum leg press 135# 3 x 15, initially pt with L thigh cramping but improves with extended time.  Standing hip 4 ways with green tband: extension x 10, flexion x 10, abduction x 10, and adduction x 10 all performed bilaterally; In // bars pt performed isometric press down with abdominal contraction 5s hold x 10; Pallof press with green tband and pt in parallel bars x 10 bilateral; Worked on forward and lateral step up/step down on 5" wooden step without UEs support 10 reps bilateral alternating LE;  Patient required several short rest breaks secondary to visible fatigue;   Neuromuscular  Re-education: Static rockerboard balance in R/L and A/P directions x 30s each; Rockerboard balance in R/L and A/P directions with horizontal head/shoulder turns x multiple bouts each direction; Airex balance with toe taps to 5" step without UE support x 10 each;  Cues for proper form/technique provided throughout session.  PT Education - 01/23/18 0827    Education provided  Yes    Education Details  exercise form/technique    Person(s) Educated  Patient    Methods  Explanation    Comprehension  Verbalized understanding       PT Short Term Goals - 12/26/17 0958      PT SHORT TERM GOAL #1   Title  Patient will report 50% or greater improvement in his symptoms of dizziness and imbalance with provoking motions or positions.    Time  4    Period  Weeks    Status  Achieved        PT Long Term Goals - 12/26/17 0957      PT LONG TERM GOAL #1   Title  Patient will demonstrate reduced falls risk as evidenced by Dynamic Gait Index (DGI) >19/24 by 06/17/2017.    Baseline  scored 13/24 on12/18/2018    Time  8    Period  Weeks    Status  On-going      PT LONG TERM GOAL #2   Title  Patient will reduce perceived disability to low levels as indicated by <40 on Dizziness Handicap Inventory.    Baseline  scored 60/100 on 12/09/2017    Time  8    Period  Weeks    Status  On-going      PT LONG TERM GOAL #3   Title  Patient reports no vertigo with provoking motions or positions.    Time  8    Period  Weeks    Status  Achieved      PT LONG TERM GOAL #4   Title  Patient will have demonstrate decreased falls risk as indicated by Activities Specific Balance Confidence Scale score of 80% or greater.    Baseline  scored 36.2% on 12/09/2017    Time  8    Period  Weeks    Status  On-going      PT LONG TERM GOAL #5   Title  Pt will decrease 5TSTS by at least 3 seconds in order to demonstrate clinically significant improvement in LE strength     Baseline  12/12/17: 17.4s    Time  8    Period  Weeks    Status  On-going            Plan - 01/23/18 9323    Clinical Impression Statement  Pt initially limited by cramping in L thigh during NuStep and leg press however this improves as session progresses. He struggles to progress resistance on leg press today. Pt demonstrates decreased UE reliance during forward and side step-ups onto 5" step. Pt encouraged to continue HEP and follow-up as scheduled.     Rehab Potential  Fair    Clinical Impairments Affecting Rehab Potential  Positive Indicators: motivated, has improved with vestibular therapy in the past  Negative Indicators: chronicity, comorbidities    PT Frequency  2x / week 2 times a week for 2 weeks and then 1 time a week for 6 weeks    PT Duration  8 weeks    PT Treatment/Interventions  Canalith Repostioning;Neuromuscular re-education;Balance training;Therapeutic exercise;Therapeutic activities;Functional mobility training;Stair training;Gait training;Patient/family education;Vestibular    PT Next Visit Plan  balance and strength training, progress leg press as pt can tolerate. Continue to progress HEP    PT Home Exercise Plan  Sit to stand 2 x 10 BID, mini squats 2 x 10 BID, semitandem balance 2 x 10 bilateral; Theraband exercises-setated clamshells  and hip flexion and standing 4 way hip    Consulted and Agree with Plan of Care  Patient       Patient will benefit from skilled therapeutic intervention in order to improve the following deficits and impairments:  Decreased balance, Dizziness, Difficulty walking, Decreased strength, Decreased range of motion  Visit Diagnosis: Dizziness and giddiness  Difficulty in walking, not elsewhere classified  Repeated falls     Problem List Patient Active Problem List   Diagnosis Date Noted  . Malignant neoplasm of right lung ([place]) 04/18/2016  . CAD (coronary artery disease) 04/01/2016  . Adenocarcinoma ([place]) 04/01/2016  . Skin  cyst 12/26/2015  . GERD (gastroesophageal reflux disease) 06/16/2015  . Abscess of back 06/06/2015  . Vertigo 03/27/2015  . Carpal tunnel syndrome 10/28/2014  . Status post cholecystectomy 09/29/2014  . Disease of digestive tract 09/29/2014  . Cardiomyopathy ([place]) 09/26/2014  . Cardiomyopathy ([place]) 09/26/2014  . Type 2 diabetes mellitus without complications ([place])   . Sleep apnea   . Spinal stenosis, lumbar region, with neurogenic claudication 06/07/2014  . Lumbar stenosis with neurogenic claudication 06/07/2014  . Abnormal gait 08/20/2012  . H/O total knee replacement 08/20/2012  . Arthritis of knee, degenerative 08/20/2012  . Pacemaker-St.Jude 08/03/2012  . Cardiac conduction disorder 06/19/2012  . Acid reflux 06/18/2012  . 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 Triggering of digit 03/25/2012  . Hyperlipidemia 12/23/2011  . Essential hypertension 03/23/2011  . Atrioventricular block, complete ([place]) 03/23/2011  . Complete atrioventricular block ([place]) 03/23/2011   Phillips Grout PT, DPT   Sherleen Pangborn 01/23/2018, 9:25 AM  [place] MAIN [facility] SERVICES [address] [place], [place], [address] Phone: [phone]   Fax:  [phone]  Name: Howard [name] MRN: [id number] Date of Birth: 1931-05-06

## 2018-01-27 ENCOUNTER — Encounter: Payer: Self-pay | Admitting: Physical Therapy

## 2018-01-27 ENCOUNTER — Ambulatory Visit: Payer: Medicare Other | Admitting: Physical Therapy

## 2018-01-27 VITALS — BP 144/75

## 2018-01-27 DIAGNOSIS — R296 Repeated falls: Secondary | ICD-10-CM | POA: Diagnosis not present

## 2018-01-27 DIAGNOSIS — R42 Dizziness and giddiness: Secondary | ICD-10-CM

## 2018-01-27 DIAGNOSIS — R262 Difficulty in walking, not elsewhere classified: Secondary | ICD-10-CM | POA: Diagnosis not present

## 2018-01-27 NOTE — Therapy (Signed)
[place] MAIN [facility] SERVICES [address] [place], [place], [address] Phone: [phone]   Fax:  [phone]  Physical Therapy Treatment  Patient Details  Name: DEMORRIS CHOYCE MRN: [id number] Date of Birth: 05/13/1931 Referring Provider: Dr. Natale Milch   Encounter Date: 01/27/2018  PT End of Session - 01/27/18 1015    Visit Number  9    Number of Visits  17    Date for PT Re-Evaluation  02/03/18    PT Start Time  1016    PT Stop Time  1100    PT Time Calculation (min)  44 min    Equipment Utilized During Treatment  Gait belt    Activity Tolerance  Patient tolerated treatment well but did have episodes of vertigo requiring rest break    Behavior During Therapy  [facility] for tasks assessed/performed       Past Medical History:  Diagnosis Date  . Arthritis   . Bell palsy   . Cancer [facility])    prostate and skin  . Chronic combined systolic and diastolic CHF, NYHA class 1 ([place])    a. 07/2014 Echo: EF 35-40%, Gr 1 DD.  Marland Kitchen Complete heart block ([place])    a. 11/2010 s/p SJM 2210 Accent DC PPM, ser# [id number].  . Depression   . Diabetes mellitus without complication ([place])   . Fall 11-10-14  . GERD (gastroesophageal reflux disease)   . History of prostate cancer   . Hyperlipidemia   . Hypertension   . LBBB (left bundle branch block)   . Left-sided Bell's palsy   . Lung cancer ([place]) 2016  . NICM (nonischemic cardiomyopathy) ([place])    a. 07/2014 Echo: EF 35-40%, mid-apicalanteroseptal DK, Gr 1 DD, mild-mod dil LA.  . Non-obstructive CAD    a. 07/2014 Abnl MV;  b. 08/2014 Cath: LM nl, LAD 30p, RI 40p, LCX nl, OM1 40, RCA dominant 30p, 70d-->Med Rx.  Marland Kitchen Poor balance   . Presence of permanent cardiac pacemaker   . Sleep apnea    a. cpap  . Vertigo   . WPW (Wolff-Parkinson-White syndrome)    a. S/P RFCA 1991.    Past Surgical History:  Procedure Laterality Date  . APPLICATION OF WOUND VAC Left 06/07/2015   Procedure: APPLICATION OF WOUND VAC;  Surgeon:  Robert Bellow, MD;  Location: [facility] ORS;  Service: General;  Laterality: Left;  left upper back  . BACK SURGERY     2011  . CARDIAC CATHETERIZATION  08/26/2014   Single vessel obstructive CAD  . CARPAL TUNNEL RELEASE  04-04-15   [facility]  . CATARACT EXTRACTION  07-31-11 and 09-18-11  . Catheter ablation  1991   for WPW  . cervical fusion    . CHOLECYSTECTOMY  09-07-14  . HAND SURGERY     right 1993; left 2005  . HERNIA REPAIR  1955  . INSERT / REPLACE / REMOVE PACEMAKER    . JOINT REPLACEMENT Left 2013   knee  . JOINT REPLACEMENT Right 2004   knee  . KNEE SURGERY     left knee 1991 and 1992; right knee 1995  . LEFT HEART CATHETERIZATION WITH CORONARY ANGIOGRAM N/A 08/26/2014   Procedure: LEFT HEART CATHETERIZATION WITH CORONARY ANGIOGRAM;  Surgeon: [name], MD;  Location: [facility] C[facility] LAB;  Service: Cardiovascular;  Laterality: N/A;  . LUMBAR LAMINECTOMY/DECOMPRESSION MICRODISCECTOMY N/A 06/07/2014   Procedure: LUMBAR FOUR TO FIVE LUMBAR LAMINECTOMY/DECOMPRESSION MICRODISCECTOMY 1 LEVEL;  Surgeon: [name], MD;  Location:  [place] NEURO ORS;  Service: Neurosurgery;  Laterality: N/A;  . LUNG BIOPSY Right 2016   Dr [name]  . PACEMAKER INSERTION     PPM-- St Jude 11/30/10 by [name]  . PROSTATE SURGERY     cancer--1998, prostatectomy  . REPLACEMENT TOTAL KNEE     2004  . ruptured disc     1962 and 1998  . TRIGGER FINGER RELEASE  01-24-15  . WOUND DEBRIDEMENT Left 06/07/2015   Procedure: DEBRIDEMENT WOUND;  Surgeon: Robert Bellow, MD;  Location: [facility] ORS;  Service: General;  Laterality: Left;  left upper back    Vitals:   01/27/18 1022  BP: (!) 144/75    Subjective Assessment - 01/27/18 1015    Subjective  Patient states he feels great. Patient states he does still have some difficulties with walking on inclines like when he goes to take the trash up and down his driveway, he states his left leg cramps up. Patient states this is a chronic issue. Patient states that he does not having issues  walking down to the cafeteria and then to rehab now.     Pertinent History  Patient know to this Probation officer and patient has been seen several times since 2015 for repeat episodes of BPPV and balance difficulties. Patient states he was doing pretty good until yesterday when he fell off the porch backwards hitting a brick wall. Patient states he did not hit his head, but did hit his left elbow. Patient states he had been reaching over to get something and states he had an episode of vertigo. Patient states he was having trouble walking in the last few days on and off and all day yesterday. Patient states he is using a SPC because he feels unsteady currently. Patient states when he woke up today he has been unstable walking but that it has been improving as the day goes on. Patient reports that he has been getting shots in his spine for arthritic pain. Patient states he has had several episodes of his left thigh locking up, but states this has not caused him to fall.     Patient Stated Goals  Patient would like to be able to lie in prone to shoot an air rifle, be able to walk without the cane and to have less dizziness and be more steady on his feet.    Currently in Pain?  No/denies       Therapeutic Exercise:  Octane Machine: Patient performed Octane machine on level 6 for 4 minutes  with cuing for pace.  (unbilled)  Leg Press: Patient performed leg press machine #150 pounds 3 sets of 15 reps with verbal cue to not lock knees in extension. Patient demonstrates improved technique.  Patient performed isometric press down with abdominal contraction 2 sets of 5 second hold x 10. Patient performed 4 reps of resisted sidestepping with green T band and 6 reps of resisted sidestepping and squat with green T band.  Step Ups: Patient performed step ups and back on 6" wooden step without upper extremity support 10 reps. Patient performed 10 reps sideways step up over and return with intermittent upper extremity  support of one hand. Patient became fatigue and had difficulty completing the last 3 steps ups leading with his left leg.    Neuromuscular Re-education: Patient performed semi-tandem stance position with leading forward foot standing on green dynadisc and the rear foot standing on green thera-band stability trainer (oval foam pad) while tapping a balloon against a mirror  and then with alternate lead leg repeated activity with balloon but patient was relying on use of one UEs for support so did alternate UE reaching to mirror with CGA. Patient challenged by having left leg forward and had multiple times he reached for // bar for support due to loss of balance.      PT Education - 01/27/18 1015    Education provided  Yes    Education Details  reemphasized importance of HEP and exercise. discussed activities he could do in [employer] and discussed transition to [employer] and gym exercises    Person(s) Educated  Patient    Methods  Explanation    Comprehension  Verbalized understanding       PT Short Term Goals - 12/26/17 0958      PT SHORT TERM GOAL #1   Title  Patient will report 50% or greater improvement in his symptoms of dizziness and imbalance with provoking motions or positions.    Time  4    Period  Weeks    Status  Achieved        PT Long Term Goals - 12/26/17 0957      PT LONG TERM GOAL #1   Title  Patient will demonstrate reduced falls risk as evidenced by Dynamic Gait Index (DGI) >19/24 by 06/17/2017.    Baseline  scored 13/24 on12/18/2018    Time  8    Period  Weeks    Status  On-going      PT LONG TERM GOAL #2   Title  Patient will reduce perceived disability to low levels as indicated by <40 on Dizziness Handicap Inventory.    Baseline  scored 60/100 on 12/09/2017    Time  8    Period  Weeks    Status  On-going      PT LONG TERM GOAL #3   Title  Patient reports no vertigo with provoking motions or positions.    Time  8    Period  Weeks    Status  Achieved       PT LONG TERM GOAL #4   Title  Patient will have demonstrate decreased falls risk as indicated by Activities Specific Balance Confidence Scale score of 80% or greater.    Baseline  scored 36.2% on 12/09/2017    Time  8    Period  Weeks    Status  On-going      PT LONG TERM GOAL #5   Title  Pt will decrease 5TSTS by at least 3 seconds in order to demonstrate clinically significant improvement in LE strength    Baseline  12/12/17: 17.4s    Time  8    Period  Weeks    Status  On-going            Plan - 01/27/18 1120    Clinical Impression Statement  Patient able to progress leg press today without left leg cramping. Patient reports that he has noticed a significant improvement since starting therapy. Patient reports that he has resumed [employer]ne[employer] class and that he plans on trying the leg press machine next time in class. Patient states that he knows that he needs to keep exercising in order to maintain gains made in therapy sessions. Patient challenged by sideways step ups, over and return on 6" wooden step this date secondary to fatigue in left leg. Patient also challenged by balance activity on dynadisc and stability trainer pads while doing balloon toss. Discussed retesting functional outcome  measures next visit.      Rehab Potential  Fair    Clinical Impairments Affecting Rehab Potential  Positive Indicators: motivated, has improved with vestibular therapy in the past  Negative Indicators: chronicity, comorbidities    PT Frequency  2x / week 2 times a week for 2 weeks and then 1 time a week for 6 weeks    PT Duration  8 weeks    PT Treatment/Interventions  Canalith Repostioning;Neuromuscular re-education;Balance training;Therapeutic exercise;Therapeutic activities;Functional mobility training;Stair training;Gait training;Patient/family education;Vestibular    PT Next Visit Plan  retest functional outcome measures next visit    PT Home Exercise Plan  Sit to stand 2 x 10 BID,  mini squats 2 x 10 BID, semitandem balance 2 x 10 bilateral; Theraband exercises-setated clamshells and hip flexion and standing 4 way hip    Consulted and Agree with Plan of Care  Patient       Patient will benefit from skilled therapeutic intervention in order to improve the following deficits and impairments:  Decreased balance, Dizziness, Difficulty walking, Decreased strength, Decreased range of motion  Visit Diagnosis: Dizziness and giddiness  Difficulty in walking, not elsewhere classified  Repeated falls     Problem List Patient Active Problem List   Diagnosis Date Noted  . Malignant neoplasm of right lung ([place]) 04/18/2016  . CAD (coronary artery disease) 04/01/2016  . Adenocarcinoma ([place]) 04/01/2016  . Skin cyst 12/26/2015  . GERD (gastroesophageal reflux disease) 06/16/2015  . Abscess of back 06/06/2015  . Vertigo 03/27/2015  . Carpal tunnel syndrome 10/28/2014  . Status post cholecystectomy 09/29/2014  . Disease of digestive tract 09/29/2014  . Cardiomyopathy ([place]) 09/26/2014  . Cardiomyopathy ([place]) 09/26/2014  . Type 2 diabetes mellitus without complications ([place])   . Sleep apnea   . Spinal stenosis, lumbar region, with neurogenic claudication 06/07/2014  . Lumbar stenosis with neurogenic claudication 06/07/2014  . Abnormal gait 08/20/2012  . H/O total knee replacement 08/20/2012  . Arthritis of knee, degenerative 08/20/2012  . Pacemaker-St.Jude 08/03/2012  . Cardiac conduction disorder 06/19/2012  . Acid reflux 06/18/2012  . 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 Triggering of digit 03/25/2012  . Hyperlipidemia 12/23/2011  . Essential hypertension 03/23/2011  . Atrioventricular block, complete ([place]) 03/23/2011  . Complete atrioventricular block ([place]) 03/23/2011   Lady Deutscher PT, DPT [phone] Lady Deutscher 01/27/2018, 2:59 PM  [place] MAIN [facility] SERVICES [address] [place], [place], [address] Phone:  [phone]   Fax:  [phone]  Name: AYMEN WIDRIG MRN: [id number] Date of Birth: 08/17/1931

## 2018-01-30 ENCOUNTER — Encounter: Payer: Medicare Other | Admitting: Physical Therapy

## 2018-02-02 DIAGNOSIS — C44319 Basal cell carcinoma of skin of other parts of face: Secondary | ICD-10-CM | POA: Diagnosis not present

## 2018-02-02 DIAGNOSIS — L905 Scar conditions and fibrosis of skin: Secondary | ICD-10-CM | POA: Diagnosis not present

## 2018-02-03 ENCOUNTER — Encounter: Payer: Self-pay | Admitting: Physical Therapy

## 2018-02-03 ENCOUNTER — Ambulatory Visit: Payer: Medicare Other | Admitting: Physical Therapy

## 2018-02-03 DIAGNOSIS — R262 Difficulty in walking, not elsewhere classified: Secondary | ICD-10-CM | POA: Diagnosis not present

## 2018-02-03 DIAGNOSIS — R296 Repeated falls: Secondary | ICD-10-CM | POA: Diagnosis not present

## 2018-02-03 DIAGNOSIS — R42 Dizziness and giddiness: Secondary | ICD-10-CM

## 2018-02-03 NOTE — Therapy (Signed)
[place] MAIN [facility] SERVICES [place], [place], [address] Phone: [phone]   Fax:  [phone]  Physical Therapy Treatment/Discharge Summary  Patient Details  Name: Howard [name] MRN: [id number] Date of Birth: 1931-12-20 Referring Provider: Dr. Natale Milch   Encounter Date: 02/03/2018  PT End of Session - 02/03/18 1013    Visit Number  10    Number of Visits  17    Date for PT Re-Evaluation  02/03/18    PT Start Time  1013    PT Stop Time  1051    PT Time Calculation (min)  38 min    Equipment Utilized During Treatment  Gait belt    Activity Tolerance  Patient tolerated treatment well but did have episodes of vertigo requiring rest break    Behavior During Therapy  [facility] for tasks assessed/performed       Past Medical History:  Diagnosis Date  . Arthritis   . Bell palsy   . Cancer [facility])    prostate and skin  . Chronic combined systolic and diastolic CHF, NYHA class 1 ([place])    a. 07/2014 Echo: EF 35-40%, Gr 1 DD.  Marland Kitchen Complete heart block ([place])    a. 11/2010 s/p SJM 2210 Accent DC PPM, ser# [id number].  . Depression   . Diabetes mellitus without complication ([place])   . Fall 11-10-14  . GERD (gastroesophageal reflux disease)   . History of prostate cancer   . Hyperlipidemia   . Hypertension   . LBBB (left bundle branch block)   . Left-sided Bell's palsy   . Lung cancer ([place]) 2016  . NICM (nonischemic cardiomyopathy) ([place])    a. 07/2014 Echo: EF 35-40%, mid-apicalanteroseptal DK, Gr 1 DD, mild-mod dil LA.  . Non-obstructive CAD    a. 07/2014 Abnl MV;  b. 08/2014 Cath: LM nl, LAD 30p, RI 40p, LCX nl, OM1 40, RCA dominant 30p, 70d-->Med Rx.  Marland Kitchen Poor balance   . Presence of permanent cardiac pacemaker   . Sleep apnea    a. cpap  . Vertigo   . WPW (Wolff-Parkinson-White syndrome)    a. S/P RFCA 1991.    Past Surgical History:  Procedure Laterality Date  . APPLICATION OF WOUND VAC Left 06/07/2015   Procedure: APPLICATION OF  WOUND VAC;  Surgeon: Robert Bellow, MD;  Location: [facility] ORS;  Service: General;  Laterality: Left;  left upper back  . BACK SURGERY     2011  . CARDIAC CATHETERIZATION  08/26/2014   Single vessel obstructive CAD  . CARPAL TUNNEL RELEASE  04-04-15   [facility]  . CATARACT EXTRACTION  07-31-11 and 09-18-11  . Catheter ablation  1991   for WPW  . cervical fusion    . CHOLECYSTECTOMY  09-07-14  . HAND SURGERY     right 1993; left 2005  . HERNIA REPAIR  1955  . INSERT / REPLACE / REMOVE PACEMAKER    . JOINT REPLACEMENT Left 2013   knee  . JOINT REPLACEMENT Right 2004   knee  . KNEE SURGERY     left knee 1991 and 1992; right knee 1995  . LEFT HEART CATHETERIZATION WITH CORONARY ANGIOGRAM N/A 08/26/2014   Procedure: LEFT HEART CATHETERIZATION WITH CORONARY ANGIOGRAM;  Surgeon: [name], MD;  Location: [facility] CATH LAB;  Service: Cardiovascular;  Laterality: N/A;  . LUMBAR LAMINECTOMY/DECOMPRESSION MICRODISCECTOMY N/A 06/07/2014   Procedure: LUMBAR FOUR TO FIVE LUMBAR LAMINECTOMY/DECOMPRESSION MICRODISCECTOMY 1 LEVEL;  Surgeon: [name], MD;  Location: [place] NEURO ORS;  Service: Neurosurgery;  Laterality: N/A;  . LUNG BIOPSY Right 2016   Dr [name]  . PACEMAKER INSERTION     PPM-- St Jude 11/30/10 by [name]  . PROSTATE SURGERY     cancer--1998, prostatectomy  . REPLACEMENT TOTAL KNEE     2004  . ruptured disc     1962 and 1998  . TRIGGER FINGER RELEASE  01-24-15  . WOUND DEBRIDEMENT Left 06/07/2015   Procedure: DEBRIDEMENT WOUND;  Surgeon: Robert Bellow, MD;  Location: [facility] ORS;  Service: General;  Laterality: Left;  left upper back    There were no vitals filed for this visit.  Subjective Assessment - 02/03/18 1012    Subjective  Patient states he felt great this past week. Patient reports he was able to work on cleaning out his basement. Patient reports that he has not had any dizziness. Patient reports that he tried to use the leg press machine but it was not working properly so he used the  NuStep.     Pertinent History  Patient know to this Probation officer and patient has been seen several times since 2015 for repeat episodes of BPPV and balance difficulties. Patient states he was doing pretty good until yesterday when he fell off the porch backwards hitting a brick wall. Patient states he did not hit his head, but did hit his left elbow. Patient states he had been reaching over to get something and states he had an episode of vertigo. Patient states he was having trouble walking in the last few days on and off and all day yesterday. Patient states he is using a SPC because he feels unsteady currently. Patient states when he woke up today he has been unstable walking but that it has been improving as the day goes on. Patient reports that he has been getting shots in his spine for arthritic pain. Patient states he has had several episodes of his left thigh locking up, but states this has not caused him to fall.     Patient Stated Goals  Patient would like to be able to lie in prone to shoot an air rifle, be able to walk without the cane and to have less dizziness and be more steady on his feet.    Currently in Pain?  No/denies         [facility] PT Assessment - 02/03/18 1031      Dynamic Gait Index   Level Surface  Mild Impairment    Change in Gait Speed  Mild Impairment    Gait with Horizontal Head Turns  Moderate Impairment    Gait with Vertical Head Turns  Normal    Gait and Pivot Turn  Normal    Step Over Obstacle  Mild Impairment    Step Around Obstacles  Normal    Steps  Mild Impairment    Total Score  18                          PT Education - 02/03/18 1012    Education provided  Yes    Education Details  discussed functional outcome testing results and progress towards goals, discussed discharge plans    Person(s) Educated  Patient    Methods  Explanation    Comprehension  Verbalized understanding       PT Short Term Goals - 02/03/18 1013      PT SHORT  TERM GOAL #1   Title  Patient will report 50% or greater improvement in his symptoms of dizziness and imbalance with provoking motions or positions.    Time  4    Period  Weeks    Status  Achieved        PT Long Term Goals - 02/03/18 1012      PT LONG TERM GOAL #1   Title  Patient will demonstrate reduced falls risk as evidenced by Dynamic Gait Index (DGI) >19/24 by 06/17/2017.    Baseline  scored 13/24 on12/18/2018; scored 18/24 on 02/03/18    Time  8    Period  Weeks    Status  Partially Met      PT LONG TERM GOAL #2   Title  Patient will reduce perceived disability to low levels as indicated by <40 on Dizziness Handicap Inventory.    Baseline  scored 60/100 on 12/09/2017; scored 20/100 on 02/03/18    Time  8    Period  Weeks    Status  Achieved      PT LONG TERM GOAL #3   Title  Patient reports no vertigo with provoking motions or positions.    Baseline  patient reports that he has been testing this out and he has not had any further episodes of dizziness or vertigo in several weeks.    Time  8    Period  Weeks    Status  Achieved      PT LONG TERM GOAL #4   Title  Patient will have demonstrate decreased falls risk as indicated by Activities Specific Balance Confidence Scale score of 80% or greater.    Baseline  scored 36.2% on 12/09/2017; scored 81.9% on 02/03/18    Time  8    Period  Weeks    Status  Achieved      PT LONG TERM GOAL #5   Title  Pt will decrease 5TSTS by at least 3 seconds in order to demonstrate clinically significant improvement in LE strength    Baseline  12/12/17: 17.4s; scored 12.6 sec on 02/03/18    Time  8    Period  Weeks    Status  Achieved            Plan - 02/03/18 1013    Clinical Impression Statement  Patient reports that he has not had an vertigo in weeks and that he feels great. Repeated functional outcome measures this date and compared to prior testing. Patient has met 1/1 STG, 4/5 LTGs and partiall met remaining LTG as set on plan  of care. Patient improved from  13/24 to 18/24 on the DGI and from 60/100 moderate perception of handicap down to 20/100 which indicates low perception of handicap on the [facility]. Patient reports no vertigo with provoking positions. Patient improved from 365 to 81.95 on the ABC scale and 5 times sit to stand test score improved from 17.4 seconds down to 12.6 seconds. Patient states he has been able to return to [employer]ne[employer] exercise program without difficulties. Patient is independent with HEP. Patient in agreement with discharge from PT services. Will discharge patient at this time.    Rehab Potential  Fair    Clinical Impairments Affecting Rehab Potential  Positive Indicators: motivated, has improved with vestibular therapy in the past  Negative Indicators: chronicity, comorbidities    PT Frequency  2x / week 2 times a week for 2 weeks and then 1 time a week for 6 weeks    PT Duration  8  weeks    PT Treatment/Interventions  Canalith Repostioning;Neuromuscular re-education;Balance training;Therapeutic exercise;Therapeutic activities;Functional mobility training;Stair training;Gait training;Patient/family education;Vestibular    PT Next Visit Plan  --    PT Home Exercise Plan  Sit to stand 2 x 10 BID, mini squats 2 x 10 BID, semitandem balance 2 x 10 bilateral; Theraband exercises-setated clamshells and hip flexion and standing 4 way hip    Consulted and Agree with Plan of Care  Patient       Patient will benefit from skilled therapeutic intervention in order to improve the following deficits and impairments:  Decreased balance, Dizziness, Difficulty walking, Decreased strength, Decreased range of motion  Visit Diagnosis: Dizziness and giddiness  Difficulty in walking, not elsewhere classified  Repeated falls     Problem List Patient Active Problem List   Diagnosis Date Noted  . Malignant neoplasm of right lung ([place]) 04/18/2016  . CAD (coronary artery disease) 04/01/2016  .  Adenocarcinoma ([place]) 04/01/2016  . Skin cyst 12/26/2015  . GERD (gastroesophageal reflux disease) 06/16/2015  . Abscess of back 06/06/2015  . Vertigo 03/27/2015  . Carpal tunnel syndrome 10/28/2014  . Status post cholecystectomy 09/29/2014  . Disease of digestive tract 09/29/2014  . Cardiomyopathy ([place]) 09/26/2014  . Cardiomyopathy ([place]) 09/26/2014  . Type 2 diabetes mellitus without complications ([place])   . Sleep apnea   . Spinal stenosis, lumbar region, with neurogenic claudication 06/07/2014  . Lumbar stenosis with neurogenic claudication 06/07/2014  . Abnormal gait 08/20/2012  . H/O total knee replacement 08/20/2012  . Arthritis of knee, degenerative 08/20/2012  . Pacemaker-St.Jude 08/03/2012  . Cardiac conduction disorder 06/19/2012  . Acid reflux 06/18/2012  . 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 Triggering of digit 03/25/2012  . Hyperlipidemia 12/23/2011  . Essential hypertension 03/23/2011  . Atrioventricular block, complete ([place]) 03/23/2011  . Complete atrioventricular block ([place]) 03/23/2011   Lady Deutscher PT, DPT [phone] Lady Deutscher 02/04/2018, 11:01 AM  [place] MAIN [facility] SERVICES [address] [place], [place], [address] Phone: [phone]   Fax:  [phone]  Name: Howard [name] MRN: [id number] Date of Birth: 03/17/1931

## 2018-02-04 DIAGNOSIS — L728 Other follicular cysts of the skin and subcutaneous tissue: Secondary | ICD-10-CM | POA: Diagnosis not present

## 2018-02-10 DIAGNOSIS — J069 Acute upper respiratory infection, unspecified: Secondary | ICD-10-CM | POA: Diagnosis not present

## 2018-02-16 DIAGNOSIS — D045 Carcinoma in situ of skin of trunk: Secondary | ICD-10-CM | POA: Diagnosis not present

## 2018-02-17 DIAGNOSIS — R05 Cough: Secondary | ICD-10-CM | POA: Diagnosis not present

## 2018-02-17 DIAGNOSIS — J40 Bronchitis, not specified as acute or chronic: Secondary | ICD-10-CM | POA: Diagnosis not present

## 2018-02-19 DIAGNOSIS — K219 Gastro-esophageal reflux disease without esophagitis: Secondary | ICD-10-CM | POA: Diagnosis not present

## 2018-02-19 DIAGNOSIS — R198 Other specified symptoms and signs involving the digestive system and abdomen: Secondary | ICD-10-CM | POA: Diagnosis not present

## 2018-02-27 DIAGNOSIS — J22 Unspecified acute lower respiratory infection: Secondary | ICD-10-CM | POA: Diagnosis not present

## 2018-02-27 DIAGNOSIS — J40 Bronchitis, not specified as acute or chronic: Secondary | ICD-10-CM | POA: Diagnosis not present

## 2018-03-08 ENCOUNTER — Other Ambulatory Visit: Payer: Self-pay

## 2018-03-08 ENCOUNTER — Ambulatory Visit
Admission: EM | Admit: 2018-03-08 | Discharge: 2018-03-08 | Disposition: A | Discharge status: Home / Self Care | Payer: Medicare Other | Attending: Family Medicine | Admitting: Family Medicine

## 2018-03-08 ENCOUNTER — Ambulatory Visit: Payer: Medicare Other

## 2018-03-08 ENCOUNTER — Encounter: Payer: Self-pay | Admitting: Emergency Medicine

## 2018-03-08 DIAGNOSIS — I456 Pre-excitation syndrome: Secondary | ICD-10-CM | POA: Insufficient documentation

## 2018-03-08 DIAGNOSIS — I700 Atherosclerosis of aorta: Secondary | ICD-10-CM | POA: Insufficient documentation

## 2018-03-08 DIAGNOSIS — R05 Cough: Secondary | ICD-10-CM | POA: Diagnosis not present

## 2018-03-08 DIAGNOSIS — Z85118 Personal history of other malignant neoplasm of bronchus and lung: Secondary | ICD-10-CM | POA: Insufficient documentation

## 2018-03-08 DIAGNOSIS — Z8249 Family history of ischemic heart disease and other diseases of the circulatory system: Secondary | ICD-10-CM | POA: Insufficient documentation

## 2018-03-08 DIAGNOSIS — E119 Type 2 diabetes mellitus without complications: Secondary | ICD-10-CM | POA: Diagnosis not present

## 2018-03-08 DIAGNOSIS — F329 Major depressive disorder, single episode, unspecified: Secondary | ICD-10-CM | POA: Insufficient documentation

## 2018-03-08 DIAGNOSIS — Z950 Presence of cardiac pacemaker: Secondary | ICD-10-CM | POA: Insufficient documentation

## 2018-03-08 DIAGNOSIS — Z7984 Long term (current) use of oral hypoglycemic drugs: Secondary | ICD-10-CM | POA: Insufficient documentation

## 2018-03-08 DIAGNOSIS — E785 Hyperlipidemia, unspecified: Secondary | ICD-10-CM | POA: Insufficient documentation

## 2018-03-08 DIAGNOSIS — I429 Cardiomyopathy, unspecified: Secondary | ICD-10-CM | POA: Diagnosis not present

## 2018-03-08 DIAGNOSIS — R059 Cough, unspecified: Secondary | ICD-10-CM

## 2018-03-08 DIAGNOSIS — Z87891 Personal history of nicotine dependence: Secondary | ICD-10-CM | POA: Diagnosis not present

## 2018-03-08 DIAGNOSIS — I2510 Atherosclerotic heart disease of native coronary artery without angina pectoris: Secondary | ICD-10-CM | POA: Insufficient documentation

## 2018-03-08 DIAGNOSIS — I5042 Chronic combined systolic (congestive) and diastolic (congestive) heart failure: Secondary | ICD-10-CM | POA: Insufficient documentation

## 2018-03-08 DIAGNOSIS — G4730 Sleep apnea, unspecified: Secondary | ICD-10-CM | POA: Diagnosis not present

## 2018-03-08 DIAGNOSIS — Z9049 Acquired absence of other specified parts of digestive tract: Secondary | ICD-10-CM | POA: Insufficient documentation

## 2018-03-08 DIAGNOSIS — K219 Gastro-esophageal reflux disease without esophagitis: Secondary | ICD-10-CM | POA: Diagnosis not present

## 2018-03-08 DIAGNOSIS — Z79899 Other long term (current) drug therapy: Secondary | ICD-10-CM | POA: Diagnosis not present

## 2018-03-08 DIAGNOSIS — M48062 Spinal stenosis, lumbar region with neurogenic claudication: Secondary | ICD-10-CM | POA: Insufficient documentation

## 2018-03-08 DIAGNOSIS — J9811 Atelectasis: Secondary | ICD-10-CM | POA: Insufficient documentation

## 2018-03-08 DIAGNOSIS — I442 Atrioventricular block, complete: Secondary | ICD-10-CM | POA: Insufficient documentation

## 2018-03-08 DIAGNOSIS — Z7982 Long term (current) use of aspirin: Secondary | ICD-10-CM | POA: Insufficient documentation

## 2018-03-08 MED ORDER — ALBUTEROL SULFATE HFA 108 (90 BASE) MCG/ACT IN AERS: 1.0000 | INHALATION_SPRAY | Freq: Four times a day (QID) | RESPIRATORY_TRACT | 0 refills | Status: DC | PRN

## 2018-03-08 MED ORDER — PREDNISONE 50 MG PO TABS: ORAL_TABLET | ORAL | 0 refills | Status: DC

## 2018-03-08 NOTE — Discharge Instructions (Signed)
Meds as prescribed.  Call Dr. Raul Del if persists.  Take care  Dr. Lacinda Axon

## 2018-03-08 NOTE — ED Provider Notes (Signed)
[facility]    CSN: [id number] Arrival date & time: 03/08/18  1122  History   Chief Complaint Chief Complaint  Patient presents with  . Cough   HPI  84 year old male with an extensive past medical history presents with cough.  Patient has had ongoing cough and congestion as well as runny nose.  He has been seen by his primary care office 3 times now.  He has had 3 courses of antibiotics.  He has had a negative chest x-ray as of late February.  He states that he continues to have severe cough.  Interfering with sleep at night.  He has had some intermittent improvement with treatment but no resolution.  Continues to be symptomatic.  No fevers or chills.  No known exacerbating factors.  No other associated symptoms.  No other complaints.  Past Medical History:  Diagnosis Date  . Arthritis   . Bell palsy   . Cancer [facility])    prostate and skin  . Chronic combined systolic and diastolic CHF, NYHA class 1 ([place])    a. 07/2014 Echo: EF 35-40%, Gr 1 DD.  Marland Kitchen Complete heart block ([place])    a. 11/2010 s/p SJM 2210 [employer] DC PPM, ser# [id number].  . Depression   . Diabetes mellitus without complication ([place])   . Fall 11-10-14  . GERD (gastroesophageal reflux disease)   . History of prostate cancer   . Hyperlipidemia   . Hypertension   . LBBB (left bundle branch block)   . Left-sided Bell's palsy   . Lung cancer ([place]) 2016  . NICM (nonischemic cardiomyopathy) ([place])    a. 07/2014 Echo: EF 35-40%, mid-apicalanteroseptal DK, Gr 1 DD, mild-mod dil LA.  . Non-obstructive CAD    a. 07/2014 Abnl MV;  b. 08/2014 Cath: LM nl, LAD 30p, RI 40p, LCX nl, OM1 40, RCA dominant 30p, 70d-->Med Rx.  Marland Kitchen Poor balance   . Presence of permanent cardiac pacemaker   . Sleep apnea    a. cpap  . Vertigo   . WPW (Wolff-Parkinson-White syndrome)    a. S/P RFCA 1991.    Patient Active Problem List   Diagnosis Date Noted  . Malignant neoplasm of right lung ([place]) 04/18/2016  . CAD (coronary artery disease)  04/01/2016  . Adenocarcinoma ([place]) 04/01/2016  . Skin cyst 12/26/2015  . GERD (gastroesophageal reflux disease) 06/16/2015  . Abscess of back 06/06/2015  . Vertigo 03/27/2015  . Carpal tunnel syndrome 10/28/2014  . Status post cholecystectomy 09/29/2014  . Disease of digestive tract 09/29/2014  . Cardiomyopathy ([place]) 09/26/2014  . Cardiomyopathy ([place]) 09/26/2014  . Type 2 diabetes mellitus without complications ([place])   . Sleep apnea   . Spinal stenosis, lumbar region, with neurogenic claudication 06/07/2014  . Lumbar stenosis with neurogenic claudication 06/07/2014  . Abnormal gait 08/20/2012  . H/O total knee replacement 08/20/2012  . Arthritis of knee, degenerative 08/20/2012  . Pacemaker-St.Jude 08/03/2012  . Cardiac conduction disorder 06/19/2012  . Acid reflux 06/18/2012  . 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 Triggering of digit 03/25/2012  . Hyperlipidemia 12/23/2011  . Essential hypertension 03/23/2011  . Atrioventricular block, complete ([place]) 03/23/2011  . Complete atrioventricular block ([place]) 03/23/2011    Past Surgical History:  Procedure Laterality Date  . APPLICATION OF WOUND VAC Left 06/07/2015   Procedure: APPLICATION OF WOUND VAC;  Surgeon: Robert Bellow, MD;  Location: [facility] ORS;  Service: General;  Laterality: Left;  left upper back  . BACK SURGERY     2011  .  CARDIAC CATHETERIZATION  08/26/2014   Single vessel obstructive CAD  . CARPAL TUNNEL RELEASE  04-04-15   [facility]  . CATARACT EXTRACTION  07-31-11 and 09-18-11  . Catheter ablation  1991   for WPW  . cervical fusion    . CHOLECYSTECTOMY  09-07-14  . HAND SURGERY     right 1993; left 2005  . HERNIA REPAIR  1955  . INSERT / REPLACE / REMOVE PACEMAKER    . JOINT REPLACEMENT Left 2013   knee  . JOINT REPLACEMENT Right 2004   knee  . KNEE SURGERY     left knee 1991 and 1992; right knee 1995  . LEFT HEART CATHETERIZATION WITH CORONARY ANGIOGRAM N/A 08/26/2014   Procedure: LEFT HEART CATHETERIZATION WITH  CORONARY ANGIOGRAM;  Surgeon: [name], MD;  Location: [facility] CATH LAB;  Service: Cardiovascular;  Laterality: N/A;  . LUMBAR LAMINECTOMY/DECOMPRESSION MICRODISCECTOMY N/A 06/07/2014   Procedure: LUMBAR FOUR TO FIVE LUMBAR LAMINECTOMY/DECOMPRESSION MICRODISCECTOMY 1 LEVEL;  Surgeon: [name], MD;  Location: [facility];  Service: Neurosurgery;  Laterality: N/A;  . LUNG BIOPSY Right 2016   Dr [name]  . PACEMAKER INSERTION     PPM-- St Jude 11/30/10 by [name]  . PROSTATE SURGERY     cancer--1998, prostatectomy  . REPLACEMENT TOTAL KNEE     2004  . ruptured disc     1962 and 1998  . TRIGGER FINGER RELEASE  01-24-15  . WOUND DEBRIDEMENT Left 06/07/2015   Procedure: DEBRIDEMENT WOUND;  Surgeon: Robert Bellow, MD;  Location: [facility] ORS;  Service: General;  Laterality: Left;  left upper back     Home Medications    Prior to Admission medications   Medication Sig Start Date End Date Taking? Authorizing Provider  aspirin 81 MG tablet Take 81 mg by mouth daily.     Yes [provider]  Cholecalciferol (VITAMIN D3) 1000 UNITS CAPS Take 1,000 Units by mouth daily.    Yes [provider]  hydrochlorothiazide (HYDRODIURIL) 25 MG tablet TAKE 1 TABLET DAILY 10/15/17  Yes Chrismon, [name], PA  metFORMIN (GLUCOPHAGE) 500 MG tablet TAKE 1 TABLET TWICE A DAY  WITH MEALS 10/21/17  Yes Chrismon, [name], PA  metoprolol succinate (TOPROL-XL) 25 MG 24 hr tablet Take 1 tablet (25 mg total) by mouth daily. 10/21/17  Yes Deboraha Sprang, MD  Multiple Vitamin (MULTIVITAMIN) capsule Take 1 capsule by mouth daily.     Yes [provider]  omeprazole (PRILOSEC) 40 MG capsule TAKE 1 CAPSULE TWICE DAILY Patient taking differently: TAKE 1 CAPSULE ONCE DAILY 04/07/17  Yes Chrismon, [name], PA  psyllium (METAMUCIL) 58.6 % powder Take 1 packet by mouth 3 (three) times daily.   Yes [provider]  ranitidine (ZANTAC) 300 MG capsule Take 300 mg by mouth every evening.   Yes [provider]  sacubitril-valsartan (ENTRESTO) 97-103 MG Take 1 tablet by mouth 2 (two) times daily. 12/31/17  Yes [name], MD  simvastatin (ZOCOR) 40 MG tablet TAKE 1 TABLET AT BEDTIME 10/21/17  Yes Gollan, Kathlene November, MD  albuterol (PROVENTIL HFA;VENTOLIN HFA) 108 (90 Base) MCG/ACT inhaler Inhale 1-2 puffs into the lungs every 6 (six) hours as needed for wheezing or shortness of breath. 03/08/18   Coral Spikes, DO  predniSONE (DELTASONE) 50 MG tablet 1 tablet daily x 5 days. 03/08/18   Coral Spikes, DO    Family History Family History  Problem Relation Age of Onset  . Heart attack  Mother   . Hyperlipidemia Mother   . CAD Unknown   . Prostate cancer Neg Hx     Social History Social History   Tobacco Use  . Smoking status: Former Smoker    Years: 4.00  . Smokeless tobacco: Never Used  . Tobacco comment: Quit 2011  Substance Use Topics  . Alcohol use: No    Frequency: Never  . Drug use: No     Allergies   Sulfa antibiotics and Sulfonamide derivatives   Review of Systems Review of Systems  Constitutional: Negative for fever.  HENT: Positive for rhinorrhea.   Respiratory: Positive for cough.    Physical Exam Triage Vital Signs ED Triage Vitals  Enc Vitals Group     BP 03/08/18 1138 (!) 157/65     Pulse Rate 03/08/18 1138 77     Resp 03/08/18 1138 16     Temp 03/08/18 1138 97.7 F (36.5 C)     Temp Source 03/08/18 1138 Oral     SpO2 03/08/18 1138 96 %     Weight 03/08/18 1134 189 lb (85.7 kg)     Height 03/08/18 1134 5\' 9"  (1.753 m)     Head Circumference --      Peak Flow --      Pain Score 03/08/18 1134 0     Pain Loc --      Pain Edu? --      Excl. in [place]? --    Updated Vital Signs BP (!) 157/65 (BP Location: Left Arm)   Pulse 77   Temp 97.7 F (36.5 C) (Oral)   Resp 16   Ht 5\' 9"  (1.753 m)   Wt 189 lb (85.7 kg)   SpO2 96%   BMI 27.91 kg/m   Physical Exam  Constitutional: He is oriented to person, place, and time. He appears  well-developed. No distress.  HENT:  Head: Normocephalic and atraumatic.  Nose: Nose normal.  Cardiovascular: Normal rate and regular rhythm.  Pulmonary/Chest: Effort normal and breath sounds normal. He has no wheezes. He has no rales.  Neurological: He is alert and oriented to person, place, and time.  Psychiatric: He has a normal mood and affect. His behavior is normal.  Nursing note and vitals reviewed.   UC Treatments / Results  Labs (all labs ordered are listed, but only abnormal results are displayed) Labs Reviewed - No data to display  EKG  EKG Interpretation None       Radiology Dg Chest 2 View  Result Date: 03/08/2018 CLINICAL DATA:  Cough and congestion for 4 weeks. EXAM: CHEST - 2 VIEW COMPARISON:  Chest CT 04/16/2017; chest radiograph 10/26/2015. FINDINGS: Multi lead pacer apparatus overlies the left hemithorax, leads are stable in position. Aortic atherosclerosis. Bibasilar atelectasis. Known pulmonary nodules are not well demonstrated on current exam. No pleural effusion or pneumothorax. IMPRESSION: No acute cardiopulmonary process. Electronically Signed   By: Lovey Newcomer M.D.   On: 03/08/2018 12:23    Procedures Procedures (including critical care time)  Medications Ordered in UC Medications - No data to display   Initial Impression / Assessment and Plan / UC Course  I have reviewed the triage vital signs and the nursing notes.  Pertinent labs & imaging results that were available during my care of the patient were reviewed by me and considered in my medical decision making (see chart for details).     84 year old male presents with continued cough.  X-ray negative.  Treating with albuterol and prednisone given  persistent symptoms despite recent treatment.  No indication for antibiotic.  If fails to improve, will need to see pulmonology.  Final Clinical Impressions(s) / UC Diagnoses   Final diagnoses:  Cough    ED Discharge Orders        Ordered     albuterol (PROVENTIL HFA;VENTOLIN HFA) 108 (90 Base) MCG/ACT inhaler  Every 6 hours PRN     03/08/18 1227    predniSONE (DELTASONE) 50 MG tablet     03/08/18 1227     Controlled Substance Prescriptions [place] Controlled Substance Registry consulted? Not Applicable   Coral Spikes, DO 03/08/18 1303

## 2018-03-08 NOTE — ED Triage Notes (Signed)
Patient c/o cough and chest congestion that has not improved.  Patient also reports running nose.  Patient finished antibiotic 3 days ago.

## 2018-03-18 ENCOUNTER — Other Ambulatory Visit: Payer: Self-pay | Admitting: Otolaryngology

## 2018-03-18 DIAGNOSIS — J320 Chronic maxillary sinusitis: Secondary | ICD-10-CM | POA: Diagnosis not present

## 2018-03-18 DIAGNOSIS — J324 Chronic pansinusitis: Secondary | ICD-10-CM | POA: Diagnosis not present

## 2018-03-18 DIAGNOSIS — J301 Allergic rhinitis due to pollen: Secondary | ICD-10-CM | POA: Diagnosis not present

## 2018-03-18 DIAGNOSIS — R05 Cough: Secondary | ICD-10-CM | POA: Diagnosis not present

## 2018-03-26 ENCOUNTER — Ambulatory Visit
Admission: RE | Admit: 2018-03-26 | Discharge: 2018-03-26 | Disposition: A | Discharge status: Home / Self Care | Payer: Medicare Other | Source: Ambulatory Visit | Attending: Radiation Oncology | Admitting: Radiation Oncology

## 2018-03-26 DIAGNOSIS — J324 Chronic pansinusitis: Secondary | ICD-10-CM

## 2018-03-26 DIAGNOSIS — J349 Unspecified disorder of nose and nasal sinuses: Secondary | ICD-10-CM | POA: Diagnosis not present

## 2018-03-26 DIAGNOSIS — J8410 Pulmonary fibrosis, unspecified: Secondary | ICD-10-CM | POA: Diagnosis not present

## 2018-03-26 DIAGNOSIS — C3411 Malignant neoplasm of upper lobe, right bronchus or lung: Secondary | ICD-10-CM | POA: Diagnosis not present

## 2018-03-26 LAB — POCT I-STAT CREATININE: CREATININE: 0.8 mg/dL (ref 0.61–1.24)

## 2018-03-26 MED ORDER — IOHEXOL 300 MG/ML  SOLN
75.0000 mL | Freq: Once | INTRAMUSCULAR | Status: AC | PRN
  Administered 2018-03-26: 75 mL via INTRAVENOUS

## 2018-04-01 ENCOUNTER — Ambulatory Visit (INDEPENDENT_AMBULATORY_CARE_PROVIDER_SITE_OTHER): Payer: Medicare Other | Admitting: *Deleted

## 2018-04-01 DIAGNOSIS — I442 Atrioventricular block, complete: Secondary | ICD-10-CM | POA: Diagnosis not present

## 2018-04-01 NOTE — Progress Notes (Signed)
Remote pacemaker transmission.   

## 2018-04-02 ENCOUNTER — Encounter: Payer: Self-pay | Admitting: Cardiology

## 2018-04-09 DIAGNOSIS — J320 Chronic maxillary sinusitis: Secondary | ICD-10-CM | POA: Diagnosis not present

## 2018-04-09 DIAGNOSIS — J339 Nasal polyp, unspecified: Secondary | ICD-10-CM | POA: Diagnosis not present

## 2018-04-16 ENCOUNTER — Ambulatory Visit: Payer: Medicare Other

## 2018-04-20 ENCOUNTER — Encounter: Payer: Self-pay | Admitting: Anesthesiology

## 2018-04-20 ENCOUNTER — Other Ambulatory Visit: Payer: Self-pay

## 2018-04-20 ENCOUNTER — Ambulatory Visit: Payer: Medicare Other | Attending: Anesthesiology | Admitting: Anesthesiology

## 2018-04-20 VITALS — BP 163/71 | HR 74 | Temp 97.9°F | Resp 18 | Ht 69.5 in | Wt 195.0 lb

## 2018-04-20 DIAGNOSIS — M545 Low back pain, unspecified: Secondary | ICD-10-CM

## 2018-04-20 DIAGNOSIS — I5042 Chronic combined systolic (congestive) and diastolic (congestive) heart failure: Secondary | ICD-10-CM | POA: Insufficient documentation

## 2018-04-20 DIAGNOSIS — Z7982 Long term (current) use of aspirin: Secondary | ICD-10-CM | POA: Diagnosis not present

## 2018-04-20 DIAGNOSIS — Z950 Presence of cardiac pacemaker: Secondary | ICD-10-CM | POA: Diagnosis not present

## 2018-04-20 DIAGNOSIS — M48062 Spinal stenosis, lumbar region with neurogenic claudication: Secondary | ICD-10-CM | POA: Diagnosis not present

## 2018-04-20 DIAGNOSIS — G4730 Sleep apnea, unspecified: Secondary | ICD-10-CM | POA: Diagnosis not present

## 2018-04-20 DIAGNOSIS — Z9049 Acquired absence of other specified parts of digestive tract: Secondary | ICD-10-CM | POA: Diagnosis not present

## 2018-04-20 DIAGNOSIS — I2510 Atherosclerotic heart disease of native coronary artery without angina pectoris: Secondary | ICD-10-CM | POA: Insufficient documentation

## 2018-04-20 DIAGNOSIS — E119 Type 2 diabetes mellitus without complications: Secondary | ICD-10-CM | POA: Diagnosis not present

## 2018-04-20 DIAGNOSIS — M5136 Other intervertebral disc degeneration, lumbar region: Secondary | ICD-10-CM | POA: Diagnosis not present

## 2018-04-20 DIAGNOSIS — I456 Pre-excitation syndrome: Secondary | ICD-10-CM | POA: Diagnosis not present

## 2018-04-20 DIAGNOSIS — Z87891 Personal history of nicotine dependence: Secondary | ICD-10-CM | POA: Diagnosis not present

## 2018-04-20 DIAGNOSIS — Z79899 Other long term (current) drug therapy: Secondary | ICD-10-CM | POA: Insufficient documentation

## 2018-04-20 DIAGNOSIS — Z8249 Family history of ischemic heart disease and other diseases of the circulatory system: Secondary | ICD-10-CM | POA: Diagnosis not present

## 2018-04-20 DIAGNOSIS — Z8546 Personal history of malignant neoplasm of prostate: Secondary | ICD-10-CM | POA: Diagnosis not present

## 2018-04-20 DIAGNOSIS — K219 Gastro-esophageal reflux disease without esophagitis: Secondary | ICD-10-CM | POA: Insufficient documentation

## 2018-04-20 DIAGNOSIS — I110 Hypertensive heart disease with heart failure: Secondary | ICD-10-CM | POA: Diagnosis not present

## 2018-04-20 DIAGNOSIS — I447 Left bundle-branch block, unspecified: Secondary | ICD-10-CM | POA: Insufficient documentation

## 2018-04-20 DIAGNOSIS — M961 Postlaminectomy syndrome, not elsewhere classified: Secondary | ICD-10-CM | POA: Diagnosis not present

## 2018-04-20 DIAGNOSIS — E785 Hyperlipidemia, unspecified: Secondary | ICD-10-CM | POA: Diagnosis not present

## 2018-04-20 DIAGNOSIS — Z7984 Long term (current) use of oral hypoglycemic drugs: Secondary | ICD-10-CM | POA: Diagnosis not present

## 2018-04-20 DIAGNOSIS — I429 Cardiomyopathy, unspecified: Secondary | ICD-10-CM | POA: Insufficient documentation

## 2018-04-20 DIAGNOSIS — G8929 Other chronic pain: Secondary | ICD-10-CM | POA: Diagnosis not present

## 2018-04-20 DIAGNOSIS — M4316 Spondylolisthesis, lumbar region: Secondary | ICD-10-CM | POA: Insufficient documentation

## 2018-04-20 DIAGNOSIS — F329 Major depressive disorder, single episode, unspecified: Secondary | ICD-10-CM | POA: Diagnosis not present

## 2018-04-20 DIAGNOSIS — M5432 Sciatica, left side: Secondary | ICD-10-CM | POA: Diagnosis not present

## 2018-04-20 DIAGNOSIS — Z85118 Personal history of other malignant neoplasm of bronchus and lung: Secondary | ICD-10-CM | POA: Insufficient documentation

## 2018-04-20 NOTE — Patient Instructions (Signed)
Pain Management Discharge Instructions  General Discharge Instructions :  If you need to reach your doctor call: Monday-Friday 8:00 am - 4:00 pm at [phone] or toll free [phone].  After clinic hours [phone] to have operator reach doctor.  Bring all of your medication bottles to all your appointments in the pain clinic.  To cancel or reschedule your appointment with Pain Management please remember to call 24 hours in advance to avoid a fee.  Refer to the educational materials which you have been given on: General Risks, I had my Procedure. Discharge Instructions, Post Sedation.  Post Procedure Instructions:  The drugs you were given will stay in your system until tomorrow, so for the next 24 hours you should not drive, make any legal decisions or drink any alcoholic beverages.  You may eat anything you prefer, but it is better to start with liquids then soups and crackers, and gradually work up to solid foods.  Please notify your doctor immediately if you have any unusual bleeding, trouble breathing or pain that is not related to your normal pain.  Depending on the type of procedure that was done, some parts of your body may feel week and/or numb.  This usually clears up by tonight or the next day.  Walk with the use of an assistive device or accompanied by an adult for the 24 hours.  You may use ice on the affected area for the first 24 hours.  Put ice in a Ziploc bag and cover with a towel and place against area 15 minutes on 15 minutes off.  You may switch to heat after 24 hours.GENERAL RISKS AND COMPLICATIONS  What are the risk, side effects and possible complications? Generally speaking, most procedures are safe.  However, with any procedure there are risks, side effects, and the possibility of complications.  The risks and complications are dependent upon the sites that are lesioned, or the type of nerve block to be performed.  The closer the procedure is to the spine,  the more serious the risks are.  Great care is taken when placing the radio frequency needles, block needles or lesioning probes, but sometimes complications can occur. 1. Infection: Any time there is an injection through the skin, there is a risk of infection.  This is why sterile conditions are used for these blocks.  There are four possible types of infection. 1. Localized skin infection. 2. Central Nervous System Infection-This can be in the form of Meningitis, which can be deadly. 3. Epidural Infections-This can be in the form of an epidural abscess, which can cause pressure inside of the spine, causing compression of the spinal cord with subsequent paralysis. This would require an emergency surgery to decompress, and there are no guarantees that the patient would recover from the paralysis. 4. Discitis-This is an infection of the intervertebral discs.  It occurs in about 1% of discography procedures.  It is difficult to treat and it may lead to surgery.        2. Pain: the needles have to go through skin and soft tissues, will cause soreness.       3. Damage to internal structures:  The nerves to be lesioned may be near blood vessels or    other nerves which can be potentially damaged.       4. Bleeding: Bleeding is more common if the patient is taking blood thinners such as  aspirin, Coumadin, Ticiid, Plavix, etc., or if he/she have some genetic predisposition  such as   hemophilia. Bleeding into the spinal canal can cause compression of the spinal  cord with subsequent paralysis.  This would require an emergency surgery to  decompress and there are no guarantees that the patient would recover from the  paralysis.       5. Pneumothorax:  Puncturing of a lung is a possibility, every time a needle is introduced in  the area of the chest or upper back.  Pneumothorax refers to free air around the  collapsed lung(s), inside of the thoracic cavity (chest cavity).  Another two possible  complications  related to a similar event would include: Hemothorax and Chylothorax.   These are variations of the Pneumothorax, where instead of air around the collapsed  lung(s), you may have blood or chyle, respectively.       6. Spinal headaches: They may occur with any procedures in the area of the spine.       7. Persistent CSF (Cerebro-Spinal Fluid) leakage: This is a rare problem, but may occur  with prolonged intrathecal or epidural catheters either due to the formation of a fistulous  track or a dural tear.       8. Nerve damage: By working so close to the spinal cord, there is always a possibility of  nerve damage, which could be as serious as a permanent spinal cord injury with  paralysis.       9. Death:  Although rare, severe deadly allergic reactions known as "Anaphylactic  reaction" can occur to any of the medications used.      10. Worsening of the symptoms:  We can always make thing worse.  What are the chances of something like this happening? Chances of any of this occuring are extremely low.  By statistics, you have more of a chance of getting killed in a motor vehicle accident: while driving to the hospital than any of the above occurring .  Nevertheless, you should be aware that they are possibilities.  In general, it is similar to taking a shower.  Everybody knows that you can slip, hit your head and get killed.  Does that mean that you should not shower again?  Nevertheless always keep in mind that statistics do not mean anything if you happen to be on the wrong side of them.  Even if a procedure has a 1 (one) in a 1,000,000 (million) chance of going wrong, it you happen to be that one..Also, keep in mind that by statistics, you have more of a chance of having something go wrong when taking medications.  Who should not have this procedure? If you are on a blood thinning medication (e.g. Coumadin, Plavix, see list of "Blood Thinners"), or if you have an active infection going on, you should not  have the procedure.  If you are taking any blood thinners, please inform your physician.  How should I prepare for this procedure?  Do not eat or drink anything at least six hours prior to the procedure.  Bring a driver with you .  It cannot be a taxi.  Come accompanied by an adult that can drive you back, and that is strong enough to help you if your legs get weak or numb from the local anesthetic.  Take all of your medicines the morning of the procedure with just enough water to swallow them.  If you have diabetes, make sure that you are scheduled to have your procedure done first thing in the morning, whenever possible.  If you have diabetes,   take only half of your insulin dose and notify our nurse that you have done so as soon as you arrive at the clinic.  If you are diabetic, but only take blood sugar pills (oral hypoglycemic), then do not take them on the morning of your procedure.  You may take them after you have had the procedure.  Do not take aspirin or any aspirin-containing medications, at least eleven (11) days prior to the procedure.  They may prolong bleeding.  Wear loose fitting clothing that may be easy to take off and that you would not mind if it got stained with Betadine or blood.  Do not wear any jewelry or perfume  Remove any nail coloring.  It will interfere with some of our monitoring equipment.  NOTE: Remember that this is not meant to be interpreted as a complete list of all possible complications.  Unforeseen problems may occur.  BLOOD THINNERS The following drugs contain aspirin or other products, which can cause increased bleeding during surgery and should not be taken for 2 weeks prior to and 1 week after surgery.  If you should need take something for relief of minor pain, you may take acetaminophen which is found in Tylenol,m Datril, Anacin-3 and Panadol. It is not blood thinner. The products listed below are.  Do not take any of the products listed below  in addition to any listed on your instruction sheet.  A.P.C or A.P.C with Codeine Codeine Phosphate Capsules #3 Ibuprofen Ridaura  ABC compound Congesprin Imuran rimadil  Advil Cope Indocin Robaxisal  Alka-Seltzer Effervescent Pain Reliever and Antacid Coricidin or Coricidin-D  Indomethacin Rufen  Alka-Seltzer plus Cold Medicine Cosprin Ketoprofen S-A-C Tablets  Anacin Analgesic Tablets or Capsules Coumadin Korlgesic Salflex  Anacin Extra Strength Analgesic tablets or capsules CP-2 Tablets Lanoril Salicylate  Anaprox Cuprimine Capsules Levenox Salocol  Anexsia-D Dalteparin Magan Salsalate  Anodynos Darvon compound Magnesium Salicylate Sine-off  Ansaid Dasin Capsules Magsal Sodium Salicylate  Anturane Depen Capsules Marnal Soma  APF Arthritis pain formula Dewitt's Pills Measurin Stanback  Argesic Dia-Gesic Meclofenamic Sulfinpyrazone  Arthritis Bayer Timed Release Aspirin Diclofenac Meclomen Sulindac  Arthritis pain formula Anacin Dicumarol Medipren Supac  Analgesic (Safety coated) Arthralgen Diffunasal Mefanamic Suprofen  Arthritis Strength Bufferin Dihydrocodeine Mepro Compound Suprol  Arthropan liquid Dopirydamole Methcarbomol with Aspirin Synalgos  ASA tablets/Enseals Disalcid Micrainin Tagament  Ascriptin Doan's Midol Talwin  Ascriptin A/D Dolene Mobidin Tanderil  Ascriptin Extra Strength Dolobid Moblgesic Ticlid  Ascriptin with Codeine Doloprin or Doloprin with Codeine Momentum Tolectin  Asperbuf Duoprin Mono-gesic Trendar  Aspergum Duradyne Motrin or Motrin IB Triminicin  Aspirin plain, buffered or enteric coated Durasal Myochrisine Trigesic  Aspirin Suppositories Easprin Nalfon Trillsate  Aspirin with Codeine Ecotrin Regular or Extra Strength Naprosyn Uracel  Atromid-S Efficin Naproxen Ursinus  Auranofin Capsules Elmiron Neocylate Vanquish  Axotal Emagrin Norgesic Verin  Azathioprine Empirin or Empirin with Codeine Normiflo Vitamin E  Azolid Emprazil Nuprin Voltaren  Bayer  Aspirin plain, buffered or children's or timed BC Tablets or powders Encaprin Orgaran Warfarin Sodium  Buff-a-Comp Enoxaparin Orudis Zorpin  Buff-a-Comp with Codeine Equegesic Os-Cal-Gesic   Buffaprin Excedrin plain, buffered or Extra Strength Oxalid   Bufferin Arthritis Strength Feldene Oxphenbutazone   Bufferin plain or Extra Strength Feldene Capsules Oxycodone with Aspirin   Bufferin with Codeine Fenoprofen Fenoprofen Pabalate or Pabalate-SF   Buffets II Flogesic Panagesic   Buffinol plain or Extra Strength Florinal or Florinal with Codeine Panwarfarin   Buf-Tabs Flurbiprofen Penicillamine   Butalbital Compound Four-way cold tablets   Penicillin   Butazolidin Fragmin Pepto-Bismol   Carbenicillin Geminisyn Percodan   Carna Arthritis Reliever Geopen Persantine   Carprofen Gold's salt Persistin   Chloramphenicol Goody's Phenylbutazone   Chloromycetin Haltrain Piroxlcam   Clmetidine heparin Plaquenil   Cllnoril Hyco-pap Ponstel   Clofibrate Hydroxy chloroquine Propoxyphen         Before stopping any of these medications, be sure to consult the physician who ordered them.  Some, such as Coumadin (Warfarin) are ordered to prevent or treat serious conditions such as "deep thrombosis", "pumonary embolisms", and other heart problems.  The amount of time that you may need off of the medication may also vary with the medication and the reason for which you were taking it.  If you are taking any of these medications, please make sure you notify your pain physician before you undergo any procedures.         Epidural Steroid Injection Patient Information  Description: The epidural space surrounds the nerves as they exit the spinal cord.  In some patients, the nerves can be compressed and inflamed by a bulging disc or a tight spinal canal (spinal stenosis).  By injecting steroids into the epidural space, we can bring irritated nerves into direct contact with a potentially helpful medication.   These steroids act directly on the irritated nerves and can reduce swelling and inflammation which often leads to decreased pain.  Epidural steroids may be injected anywhere along the spine and from the neck to the low back depending upon the location of your pain.   After numbing the skin with local anesthetic (like Novocaine), a small needle is passed into the epidural space slowly.  You may experience a sensation of pressure while this is being done.  The entire block usually last less than 10 minutes.  Conditions which may be treated by epidural steroids:   Low back and leg pain  Neck and arm pain  Spinal stenosis  Post-laminectomy syndrome  Herpes zoster (shingles) pain  Pain from compression fractures  Preparation for the injection:  1. Do not eat any solid food or dairy products within 8 hours of your appointment.  2. You may drink clear liquids up to 3 hours before appointment.  Clear liquids include water, black coffee, juice or soda.  No milk or cream please. 3. You may take your regular medication, including pain medications, with a sip of water before your appointment  Diabetics should hold regular insulin (if taken separately) and take 1/2 normal NPH dos the morning of the procedure.  Carry some sugar containing items with you to your appointment. 4. A driver must accompany you and be prepared to drive you home after your procedure.  5. Bring all your current medications with your. 6. An IV may be inserted and sedation may be given at the discretion of the physician.   7. A blood pressure cuff, EKG and other monitors will often be applied during the procedure.  Some patients may need to have extra oxygen administered for a short period. 8. You will be asked to provide medical information, including your allergies, prior to the procedure.  We must know immediately if you are taking blood thinners (like Coumadin/Warfarin)  Or if you are allergic to IV iodine contrast (dye). We  must know if you could possible be pregnant.  Possible side-effects:  Bleeding from needle site  Infection (rare, may require surgery)  Nerve injury (rare)  Numbness & tingling (temporary)  Difficulty urinating (rare, temporary)  Spinal headache (   a headache worse with upright posture)  Light -headedness (temporary)  Pain at injection site (several days)  Decreased blood pressure (temporary)  Weakness in arm/leg (temporary)  Pressure sensation in back/neck (temporary)  Call if you experience:  Fever/chills associated with headache or increased back/neck pain.  Headache worsened by an upright position.  New onset weakness or numbness of an extremity below the injection site  Hives or difficulty breathing (go to the emergency room)  Inflammation or drainage at the infection site  Severe back/neck pain  Any new symptoms which are concerning to you  Please note:  Although the local anesthetic injected can often make your back or neck feel good for several hours after the injection, the pain will likely return.  It takes 3-7 days for steroids to work in the epidural space.  You may not notice any pain relief for at least that one week.  If effective, we will often do a series of three injections spaced 3-6 weeks apart to maximally decrease your pain.  After the initial series, we generally will wait several months before considering a repeat injection of the same type.  If you have any questions, please call [phone] [facility] 

## 2018-04-20 NOTE — Progress Notes (Signed)
Safety precautions to be maintained throughout the outpatient stay will include: orient to surroundings, keep bed in low position, maintain call bell within reach at all times, provide assistance with transfer out of bed and ambulation.  

## 2018-04-22 ENCOUNTER — Other Ambulatory Visit: Payer: Self-pay | Admitting: *Deleted

## 2018-04-22 ENCOUNTER — Encounter: Payer: Self-pay | Admitting: Radiation Oncology

## 2018-04-22 ENCOUNTER — Other Ambulatory Visit: Payer: Self-pay

## 2018-04-22 ENCOUNTER — Ambulatory Visit
Admission: RE | Admit: 2018-04-22 | Discharge: 2018-04-22 | Disposition: A | Discharge status: Home / Self Care | Payer: Medicare Other | Source: Ambulatory Visit | Attending: Radiation Oncology | Admitting: Radiation Oncology

## 2018-04-22 VITALS — BP 159/78 | HR 64 | Temp 95.9°F | Resp 18 | Wt 192.5 lb

## 2018-04-22 DIAGNOSIS — Z923 Personal history of irradiation: Secondary | ICD-10-CM | POA: Diagnosis not present

## 2018-04-22 DIAGNOSIS — R05 Cough: Secondary | ICD-10-CM | POA: Insufficient documentation

## 2018-04-22 DIAGNOSIS — C3411 Malignant neoplasm of upper lobe, right bronchus or lung: Secondary | ICD-10-CM

## 2018-04-22 DIAGNOSIS — F17210 Nicotine dependence, cigarettes, uncomplicated: Secondary | ICD-10-CM | POA: Insufficient documentation

## 2018-04-22 NOTE — Progress Notes (Signed)
Radiation Oncology Follow up Note  Name: Howard [name]   Date:   04/22/2018 MRN:  [id number] DOB: [birth year]    This 84 y.o. male presents to the clinic today for 2 year follow-up status post SB RT for stage I adenocarcinoma the right lung.  REFERRING PROVIDER: Margo Common, PA  HPI: patient is a 84 year old male now out 2 years having completed SB RT to his right lung for stage I adenocarcinoma seen today in routine follow-up he is doing well. Specifically denies cough hemoptysis or chest tightness. 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  COMPLICATIONS OF TREATMENT: none  FOLLOW UP COMPLIANCE: keeps appointments   PHYSICAL EXAM:  BP (!) 159/78   Pulse 64   Temp (!) 95.9 F (35.5 C)   Resp 18   Wt 192 lb 7.4 oz (87.3 kg)   BMI 28.01 kg/m  Well-developed well-nourished patient in NAD. HEENT reveals PERLA, EOMI, discs not visualized.  Oral cavity is clear. No oral mucosal lesions are identified. Neck is clear without evidence of cervical or supraclavicular adenopathy. Lungs are clear to A&P. Cardiac examination is essentially unremarkable with regular rate and rhythm without murmur rub or thrill. Abdomen is benign with no organomegaly or masses noted. Motor sensory and DTR levels are equal and symmetric in the upper and lower extremities. Cranial nerves II through XII are grossly intact. Proprioception is intact. No peripheral adenopathy or edema is identified. No motor or sensory levels are noted. Crude visual fields are within normal range.  RADIOLOGY RESULTS: CT scans reviewed and compatible with the above-stated findings  PLAN: present time patient is doing well with stable chest by CT criteria. I'm please was overall progress. I've asked to see him back in 1 year for follow-up with a repeat CT scan of the chest at  that time. Patient knows to call sooner with any concerns.  I would like to take this opportunity to thank you for allowing me to participate in the care of your patient.Noreene Filbert, MD

## 2018-04-23 DIAGNOSIS — L538 Other specified erythematous conditions: Secondary | ICD-10-CM | POA: Diagnosis not present

## 2018-04-23 DIAGNOSIS — B078 Other viral warts: Secondary | ICD-10-CM | POA: Diagnosis not present

## 2018-04-23 DIAGNOSIS — R208 Other disturbances of skin sensation: Secondary | ICD-10-CM | POA: Diagnosis not present

## 2018-04-23 NOTE — Progress Notes (Signed)
Subjective:  Patient ID: Howard [name], male    DOB: 1931-04-01  Age: 84 y.o. MRN: [id number]  CC: Back Pain (low, worse on the left)     PROCEDURE: None  HPI Howard [name] presents for new patient evaluation.  He is a pleasant 84 year old white male with a long-standing history of low back pain.  Is primarily left-sided with radiation down into the left hip and buttock region.  This is been present for many years and he has had previous back surgeries as well.  He is also received previous injections over in [place].  He rates his pain as a maximum pain score of 8 minimum of is 0 worse in the mornings and afternoon and aggravated by things such as walking uphill.  Rest and sitting seems to help alleviate some of the pain.  He has a chronic problem with bladder control but bowel control is not an issue and this wakes him up at night.  He describes the pain as sharp and disabling.  He has had previous epidural injections generally lasting approximately a month giving him some 50 to 70% improvement he describes.  Otherwise the quality characteristic and distribution of of his lower back pain and leg pain is been stable in nature.  Furthermore a CT lumbar myelogram was performed in 2018 and this is been reviewed in detail today and with the patient.  History Sasha has a past medical history of Arthritis, Bell palsy, Cancer ([employer]), Chronic combined systolic and diastolic CHF, NYHA class 1 ([place]), Complete heart block ([place]), Depression, Diabetes mellitus without complication ([place]), Fall (11-10-14), GERD (gastroesophageal reflux disease), History of prostate cancer, Hyperlipidemia, Hypertension, LBBB (left bundle branch block), Left-sided Bell's palsy, Lung cancer ([place]) (2016), NICM (nonischemic cardiomyopathy) ([place]), Non-obstructive CAD, Poor balance, Presence of permanent cardiac pacemaker, Sleep apnea, Vertigo, and WPW (Wolff-Parkinson-White syndrome).   He has a past surgical history that includes  ruptured disc; cervical fusion; Knee surgery; Replacement total knee; Hand surgery; Back surgery; Pacemaker insertion; Catheter ablation (1991); Joint replacement (Left, 2013); Joint replacement (Right, 2004); Lumbar laminectomy/decompression microdiscectomy (N/A, 06/07/2014); Insert / replace / remove pacemaker; Cardiac catheterization (08/26/2014); left heart catheterization with coronary angiogram (N/A, 08/26/2014); Cataract extraction (07-31-11 and 09-18-11); Hernia repair [phone]); Cholecystectomy (09-07-14); Trigger finger release (01-24-15); Carpal tunnel release (04-04-15); Wound debridement (Left, 06/07/2015); Application if wound vac (Left, 06/07/2015); Lung biopsy (Right, 2016); and Prostate surgery.   His family history includes CAD in his unknown relative; Heart attack in his mother; Hyperlipidemia in his mother.He reports that he has quit smoking. He quit after 4.00 years of use. He has never used smokeless tobacco. He reports that he does not drink alcohol or use drugs.  No results found for this or any previous visit.  No results found for: TOXASSSELUR  Outpatient Medications Prior to Visit  Medication Sig Dispense Refill  . aspirin 81 MG tablet Take 81 mg by mouth daily.      . Cholecalciferol (VITAMIN D3) 1000 UNITS CAPS Take 1,000 Units by mouth daily.     . hydrochlorothiazide (HYDRODIURIL) 25 MG tablet TAKE 1 TABLET DAILY 90 tablet 3  . metFORMIN (GLUCOPHAGE) 500 MG tablet TAKE 1 TABLET TWICE A DAY  WITH MEALS 180 tablet 3  . metoprolol succinate (TOPROL-XL) 25 MG 24 hr tablet Take 1 tablet (25 mg total) by mouth daily. 90 tablet 3  . Multiple Vitamin (MULTIVITAMIN) capsule Take 1 capsule by mouth daily.      Marland Kitchen omeprazole (PRILOSEC) 40 MG capsule  TAKE 1 CAPSULE TWICE DAILY (Patient taking differently: TAKE 1 CAPSULE ONCE DAILY) 180 capsule 3  . psyllium (METAMUCIL) 58.6 % powder Take 1 packet by mouth 3 (three) times daily.    . ranitidine (ZANTAC) 300 MG capsule Take 300 mg by mouth every  evening.    . sacubitril-valsartan (ENTRESTO) 97-103 MG Take 1 tablet by mouth 2 (two) times daily. 180 tablet 3  . simvastatin (ZOCOR) 40 MG tablet TAKE 1 TABLET AT BEDTIME 90 tablet 0  . albuterol (PROVENTIL HFA;VENTOLIN HFA) 108 (90 Base) MCG/ACT inhaler Inhale 1-2 puffs into the lungs every 6 (six) hours as needed for wheezing or shortness of breath. (Patient not taking: Reported on 04/20/2018) 1 Inhaler 0  . predniSONE (DELTASONE) 50 MG tablet 1 tablet daily x 5 days. (Patient not taking: Reported on 04/20/2018) 5 tablet 0   No facility-administered medications prior to visit.    Lab Results  Component Value Date   WBC 5.7 09/30/2017   HGB 14.2 09/30/2017   HCT 44.0 09/30/2017   PLT 237 09/30/2017   GLUCOSE 137 (H) 11/25/2017   CHOL 135 09/30/2017   TRIG 154 (H) 09/30/2017   HDL 48 09/30/2017   LDLCALC 63 09/30/2017   ALT 20 09/30/2017   AST 18 09/30/2017   NA 135 11/25/2017   K 4.3 11/25/2017   CL 96 11/25/2017   CREATININE 0.80 03/26/2018   BUN 14 11/25/2017   CO2 20 11/25/2017   TSH 1.09 09/30/2017   INR 0.94 10/26/2015   HGBA1C 7.3 01/02/2018   MICROALBUR 50 09/30/2017    --------------------------------------------------------------------------------------------------------------------- No results found.     ---------------------------------------------------------------------------------------------------------------------- Past Medical History:  Diagnosis Date  . Arthritis   . Bell palsy   . Cancer [facility])    prostate and skin  . Chronic combined systolic and diastolic CHF, NYHA class 1 ([place])    a. 07/2014 Echo: EF 35-40%, Gr 1 DD.  Marland Kitchen Complete heart block ([place])    a. 11/2010 s/p SJM 2210 [facility] DC PPM, ser# [id number].  . Depression   . Diabetes mellitus without complication ([place])   . Fall 11-10-14  . GERD (gastroesophageal reflux disease)   . History of prostate cancer   . Hyperlipidemia   . Hypertension   . LBBB (left bundle branch block)   . Left-sided  Bell's palsy   . Lung cancer ([place]) 2016  . NICM (nonischemic cardiomyopathy) ([place])    a. 07/2014 Echo: EF 35-40%, mid-apicalanteroseptal DK, Gr 1 DD, mild-mod dil LA.  . Non-obstructive CAD    a. 07/2014 Abnl MV;  b. 08/2014 Cath: LM nl, LAD 30p, RI 40p, LCX nl, OM1 40, RCA dominant 30p, 70d-->Med Rx.  Marland Kitchen Poor balance   . Presence of permanent cardiac pacemaker   . Sleep apnea    a. cpap  . Vertigo   . WPW (Wolff-Parkinson-White syndrome)    a. S/P RFCA 1991.    Past Surgical History:  Procedure Laterality Date  . APPLICATION OF WOUND VAC Left 06/07/2015   Procedure: APPLICATION OF WOUND VAC;  Surgeon: Robert Bellow, MD;  Location: [facility] ORS;  Service: General;  Laterality: Left;  left upper back  . BACK SURGERY     2011  . CARDIAC CATHETERIZATION  08/26/2014   Single vessel obstructive CAD  . CARPAL TUNNEL RELEASE  04-04-15   [facility]  . CATARACT EXTRACTION  07-31-11 and 09-18-11  . Catheter ablation  1991   for WPW  . cervical fusion    . CHOLECYSTECTOMY  09-07-14  . HAND SURGERY     right 1993; left 2005  . HERNIA REPAIR  1955  . INSERT / REPLACE / REMOVE PACEMAKER    . JOINT REPLACEMENT Left 2013   knee  . JOINT REPLACEMENT Right 2004   knee  . KNEE SURGERY     left knee 1991 and 1992; right knee 1995  . LEFT HEART CATHETERIZATION WITH CORONARY ANGIOGRAM N/A 08/26/2014   Procedure: LEFT HEART CATHETERIZATION WITH CORONARY ANGIOGRAM;  Surgeon: [name], MD;  Location: [facility] CATH LAB;  Service: Cardiovascular;  Laterality: N/A;  . LUMBAR LAMINECTOMY/DECOMPRESSION MICRODISCECTOMY N/A 06/07/2014   Procedure: LUMBAR FOUR TO FIVE LUMBAR LAMINECTOMY/DECOMPRESSION MICRODISCECTOMY 1 LEVEL;  Surgeon: [name], MD;  Location: [facility] NEURO ORS;  Service: Neurosurgery;  Laterality: N/A;  . LUNG BIOPSY Right 2016   Dr [name]  . PACEMAKER INSERTION     PPM-- St Jude 11/30/10 by [name]  . PROSTATE SURGERY     cancer--1998, prostatectomy  . REPLACEMENT TOTAL KNEE     2004  . ruptured disc      1962 and 1998  . TRIGGER FINGER RELEASE  01-24-15  . WOUND DEBRIDEMENT Left 06/07/2015   Procedure: DEBRIDEMENT WOUND;  Surgeon: Robert Bellow, MD;  Location: [facility] ORS;  Service: General;  Laterality: Left;  left upper back    Family History  Problem Relation Age of Onset  . Heart attack Mother   . Hyperlipidemia Mother   . CAD Unknown   . Prostate cancer Neg Hx     Social History   Tobacco Use  . Smoking status: Former Smoker    Years: 4.00  . Smokeless tobacco: Never Used  . Tobacco comment: Quit 2011  Substance Use Topics  . Alcohol use: No    Frequency: Never    ---------------------------------------------------------------------------------------------------------------------  Scheduled Meds: Continuous Infusions: PRN Meds:.   BP (!) 163/71   Pulse 74   Temp 97.9 F (36.6 C) (Oral)   Resp 18   Ht 5' 9.5" (1.765 m)   Wt 195 lb (88.5 kg)   SpO2 99%   BMI 28.38 kg/m    BP Readings from Last 3 Encounters:  04/22/18 (!) 159/78  04/20/18 (!) 163/71  03/08/18 (!) 157/65     Wt Readings from Last 3 Encounters:  04/22/18 192 lb 7.4 oz (87.3 kg)  04/20/18 195 lb (88.5 kg)  03/08/18 189 lb (85.7 kg)     ----------------------------------------------------------------------------------------------------------------------  ROS Review of Systems  Cardiovascular: No angina palpitations Pulmonary: No shortness of breath wheezing Neurologic: As above Psych: Noncontributory GI: No abdominal pain or constipation  Objective:  BP (!) 163/71   Pulse 74   Temp 97.9 F (36.6 C) (Oral)   Resp 18   Ht 5' 9.5" (1.765 m)   Wt 195 lb (88.5 kg)   SpO2 99%   BMI 28.38 kg/m   Physical Exam Patient is alert oriented cooperative compliant Cranial nerves II through XII appear grossly intact Heart is regular rate and rhythm without murmur rubs or gallops Lungs: Clear to auscultation without wheeze or rales Inspection of low back reveals some paraspinous muscle  tenderness.  Is well-healed midline scar and he ambulates with an antalgic gait.     Assessment & Plan:   Symeon was seen today for back pain.  Diagnoses and all orders for this visit:  Lumbar post-laminectomy syndrome  DDD (degenerative disc disease), lumbar -     Lumbar Epidural Injection; Future  Sciatica of left side  Chronic left-sided low back pain without sciatica  Spinal stenosis, lumbar region, with neurogenic claudication -     Lumbar Epidural Injection; Future     ----------------------------------------------------------------------------------------------------------------------  Problem List Items Addressed This Visit      Unprioritized   Spinal stenosis, lumbar region, with neurogenic claudication   Relevant Orders   Lumbar Epidural Injection    Other Visit Diagnoses    Lumbar post-laminectomy syndrome    -  Primary   DDD (degenerative disc disease), lumbar       Relevant Orders   Lumbar Epidural Injection   Sciatica of left side       Chronic left-sided low back pain without sciatica          ----------------------------------------------------------------------------------------------------------------------  1. Lumbar post-laminectomy syndrome Unfortunately Mr. Paglia has a very difficult situation with his chronic low back pain.  In review of his CT lumbar myelogram he has multilevel severe degenerative disc disease with retrolisthesis and severe facet arthropathy at multiple levels.  There is also evidence of spinal stenosis and foraminal encroachment further complicating the matter.  He is a nonsurgical candidate.  Fortunately he has responded in the past 2 epidural steroid administration and this may be an option for him.  I am going to have him return to clinic in the next few weeks for a possible caudal epidural steroid versus lumbar epidural steroid injection.  This will be contingent on fluoroscopic evaluation at that time.  Otherwise I want  him to continue with core stretching strengthening exercises as once again reviewed today.  He may be a candidate for medication management as well.  2. DDD (degenerative disc disease), lumbar As above - Lumbar Epidural Injection; Future  3. Sciatica of left side As above  4. Chronic left-sided low back pain without sciatica As above  5. Spinal stenosis, lumbar region, with neurogenic claudication As above - Lumbar Epidural Injection; Future    ----------------------------------------------------------------------------------------------------------------------  I am having Howard [name] maintain his multivitamin, Vitamin D3, aspirin, omeprazole, psyllium, ranitidine, hydrochlorothiazide, metFORMIN, simvastatin, metoprolol succinate, sacubitril-valsartan, albuterol, and predniSONE.   No orders of the defined types were placed in this encounter.      Follow-up: Return in about 1 week (around 04/27/2018) for procedure.    Molli Barrows, MD 9:31 AM  The [place] practitioner database for opioid medications on this patient has been reviewed by me and my staff   Greater than 50% of the total encounter time was spent in counseling and / or coordination of care.     This dictation was performed utilizing Systems analyst.  Please excuse any unintentional or mistaken typographical errors as a result.

## 2018-04-27 ENCOUNTER — Other Ambulatory Visit: Payer: Self-pay

## 2018-04-27 ENCOUNTER — Ambulatory Visit (HOSPITAL_BASED_OUTPATIENT_CLINIC_OR_DEPARTMENT_OTHER): Payer: Medicare Other | Admitting: Anesthesiology

## 2018-04-27 ENCOUNTER — Encounter: Payer: Self-pay | Admitting: Anesthesiology

## 2018-04-27 ENCOUNTER — Ambulatory Visit
Admission: RE | Admit: 2018-04-27 | Discharge: 2018-04-27 | Disposition: A | Discharge status: Home / Self Care | Payer: Medicare Other | Source: Ambulatory Visit | Attending: Anesthesiology | Admitting: Anesthesiology

## 2018-04-27 ENCOUNTER — Other Ambulatory Visit: Payer: Self-pay | Admitting: Anesthesiology

## 2018-04-27 VITALS — BP 161/79 | HR 81 | Temp 97.9°F | Resp 16 | Ht 69.5 in | Wt 195.0 lb

## 2018-04-27 DIAGNOSIS — Z7984 Long term (current) use of oral hypoglycemic drugs: Secondary | ICD-10-CM | POA: Diagnosis not present

## 2018-04-27 DIAGNOSIS — Z79899 Other long term (current) drug therapy: Secondary | ICD-10-CM | POA: Insufficient documentation

## 2018-04-27 DIAGNOSIS — G8929 Other chronic pain: Secondary | ICD-10-CM | POA: Insufficient documentation

## 2018-04-27 DIAGNOSIS — M961 Postlaminectomy syndrome, not elsewhere classified: Secondary | ICD-10-CM | POA: Diagnosis not present

## 2018-04-27 DIAGNOSIS — M545 Low back pain, unspecified: Secondary | ICD-10-CM

## 2018-04-27 DIAGNOSIS — M48062 Spinal stenosis, lumbar region with neurogenic claudication: Secondary | ICD-10-CM | POA: Insufficient documentation

## 2018-04-27 DIAGNOSIS — R52 Pain, unspecified: Secondary | ICD-10-CM

## 2018-04-27 DIAGNOSIS — M5136 Other intervertebral disc degeneration, lumbar region: Secondary | ICD-10-CM

## 2018-04-27 DIAGNOSIS — M5432 Sciatica, left side: Secondary | ICD-10-CM | POA: Diagnosis not present

## 2018-04-27 MED ORDER — IOPAMIDOL (ISOVUE-M 200) INJECTION 41%
20.0000 mL | Freq: Once | INTRAMUSCULAR | Status: DC | PRN
Start: 2018-04-27 — End: 2018-04-29
  Administered 2018-04-27: 20 mL
  Filled 2018-04-27: qty 20

## 2018-04-27 MED ORDER — SODIUM CHLORIDE 0.9% FLUSH
10.0000 mL | Freq: Once | INTRAVENOUS | Status: AC
  Administered 2018-04-27: 10 mL

## 2018-04-27 MED ORDER — MIDAZOLAM HCL 5 MG/5ML IJ SOLN
5.0000 mg | Freq: Once | INTRAMUSCULAR | Status: AC
  Administered 2018-04-27: 1 mg via INTRAVENOUS

## 2018-04-27 MED ORDER — LACTATED RINGERS IV SOLN
1000.0000 mL | INTRAVENOUS | Status: DC
  Administered 2018-04-27: 1000 mL via INTRAVENOUS

## 2018-04-27 MED ORDER — TRIAMCINOLONE ACETONIDE 40 MG/ML IJ SUSP
40.0000 mg | Freq: Once | INTRAMUSCULAR | Status: AC
  Administered 2018-04-27: 40 mg

## 2018-04-27 MED ORDER — TRIAMCINOLONE ACETONIDE 40 MG/ML IJ SUSP
INTRAMUSCULAR | Status: AC
  Filled 2018-04-27: qty 1

## 2018-04-27 MED ORDER — IOPAMIDOL (ISOVUE-M 200) INJECTION 41%
INTRAMUSCULAR | Status: AC
  Filled 2018-04-27: qty 10

## 2018-04-27 MED ORDER — MIDAZOLAM HCL 5 MG/5ML IJ SOLN
INTRAMUSCULAR | Status: AC
  Filled 2018-04-27: qty 5

## 2018-04-27 MED ORDER — ROPIVACAINE HCL 2 MG/ML IJ SOLN
10.0000 mL | Freq: Once | INTRAMUSCULAR | Status: AC
  Administered 2018-04-27: 10 mL via EPIDURAL

## 2018-04-27 MED ORDER — LIDOCAINE HCL (PF) 1 % IJ SOLN
5.0000 mL | Freq: Once | INTRAMUSCULAR | Status: AC
  Administered 2018-04-27: 5 mL via SUBCUTANEOUS

## 2018-04-27 NOTE — Patient Instructions (Signed)
Pain Management Discharge Instructions  General Discharge Instructions :  If you need to reach your doctor call: Monday-Friday 8:00 am - 4:00 pm at [phone] or toll free [phone].  After clinic hours [phone] to have operator reach doctor.  Bring all of your medication bottles to all your appointments in the pain clinic.  To cancel or reschedule your appointment with Pain Management please remember to call 24 hours in advance to avoid a fee.  Refer to the educational materials which you have been given on: General Risks, I had my Procedure. Discharge Instructions, Post Sedation.  Post Procedure Instructions:  The drugs you were given will stay in your system until tomorrow, so for the next 24 hours you should not drive, make any legal decisions or drink any alcoholic beverages.  You may eat anything you prefer, but it is better to start with liquids then soups and crackers, and gradually work up to solid foods.  Please notify your doctor immediately if you have any unusual bleeding, trouble breathing or pain that is not related to your normal pain.  Depending on the type of procedure that was done, some parts of your body may feel week and/or numb.  This usually clears up by tonight or the next day.  Walk with the use of an assistive device or accompanied by an adult for the 24 hours.  You may use ice on the affected area for the first 24 hours.  Put ice in a Ziploc bag and cover with a towel and place against area 15 minutes on 15 minutes off.  You may switch to heat after 24 hours.Epidural Steroid Injection An epidural steroid injection is a shot of steroid medicine and numbing medicine that is given into the space between the spinal cord and the bones in your back (epidural space). The shot helps relieve pain caused by an irritated or swollen nerve root. The amount of pain relief you get from the injection depends on what is causing the nerve to be swollen and irritated,  and how long your pain lasts. You are more likely to benefit from this injection if your pain is strong and comes on suddenly rather than if you have had pain for a long time. Tell a health care provider about:  Any allergies you have.  All medicines you are taking, including vitamins, herbs, eye drops, creams, and over-the-counter medicines.  Any problems you or family members have had with anesthetic medicines.  Any blood disorders you have.  Any surgeries you have had.  Any medical conditions you have.  Whether you are pregnant or may be pregnant. What are the risks? Generally, this is a safe procedure. However, problems may occur, including:  Headache.  Bleeding.  Infection.  Allergic reaction to medicines.  Damage to your nerves.  What happens before the procedure? Staying hydrated Follow instructions from your health care provider about hydration, which may include:  Up to 2 hours before the procedure - you may continue to drink clear liquids, such as water, clear fruit juice, black coffee, and plain tea.  Eating and drinking restrictions Follow instructions from your health care provider about eating and drinking, which may include:  8 hours before the procedure - stop eating heavy meals or foods such as meat, fried foods, or fatty foods.  6 hours before the procedure - stop eating light meals or foods, such as toast or cereal.  6 hours before the procedure - stop drinking milk or drinks that contain milk.  2 hours before the procedure - stop drinking clear liquids.  Medicine  You may be given medicines to lower anxiety.  Ask your health care provider about: ? Changing or stopping your regular medicines. This is especially important if you are taking diabetes medicines or blood thinners. ? Taking medicines such as aspirin and ibuprofen. These medicines can thin your blood. Do not take these medicines before your procedure if your health care provider  instructs you not to. General instructions  Plan to have someone take you home from the hospital or clinic. What happens during the procedure?  You may receive a medicine to help you relax (sedative).  You will be asked to lie on your abdomen.  The injection site will be cleaned.  A numbing medicine (local anesthetic) will be used to numb the injection site.  A needle will be inserted through your skin into the epidural space. You may feel some discomfort when this happens. An X-ray machine will be used to make sure the needle is put as close as possible to the affected nerve.  A steroid medicine and a local anesthetic will be injected into the epidural space.  The needle will be removed.  A bandage (dressing) will be put over the injection site. What happens after the procedure?  Your blood pressure, heart rate, breathing rate, and blood oxygen level will be monitored until the medicines you were given have worn off.  Your arm or leg may feel weak or numb for a few hours.  The injection site may feel sore.  Do not drive for 24 hours if you received a sedative. This information is not intended to replace advice given to you by your health care provider. Make sure you discuss any questions you have with your health care provider. Document Released: 03/17/2008 Document Revised: 05/22/2016 Document Reviewed: 03/26/2016 Elsevier Interactive Patient Education  [employer].

## 2018-04-27 NOTE — Progress Notes (Signed)
Subjective:  Patient ID: Howard [name], male    DOB: Aug 30, 1931  Age: 84 y.o. MRN: [id number]  CC: Back Pain (low and left)   Procedure: L2-3 epidural steroid under fluoroscopic guidance with moderate sedation  HPI Howard [name] presents for reevaluation.  He was last seen a few days ago and continues to have low back pain primarily left greater than right side.  Usually this radiates into the hip and buttock region.  It is worse with standing and he gets progressively weak with prolonged standing and walking.  Otherwise he is in his usual state of health at this time.  Outpatient Medications Prior to Visit  Medication Sig Dispense Refill  . albuterol (PROVENTIL HFA;VENTOLIN HFA) 108 (90 Base) MCG/ACT inhaler Inhale 1-2 puffs into the lungs every 6 (six) hours as needed for wheezing or shortness of breath. 1 Inhaler 0  . aspirin 81 MG tablet Take 81 mg by mouth daily.      . Cholecalciferol (VITAMIN D3) 1000 UNITS CAPS Take 1,000 Units by mouth daily.     . hydrochlorothiazide (HYDRODIURIL) 25 MG tablet TAKE 1 TABLET DAILY 90 tablet 3  . metFORMIN (GLUCOPHAGE) 500 MG tablet TAKE 1 TABLET TWICE A DAY  WITH MEALS 180 tablet 3  . metoprolol succinate (TOPROL-XL) 25 MG 24 hr tablet Take 1 tablet (25 mg total) by mouth daily. 90 tablet 3  . Multiple Vitamin (MULTIVITAMIN) capsule Take 1 capsule by mouth daily.      Marland Kitchen omeprazole (PRILOSEC) 40 MG capsule TAKE 1 CAPSULE TWICE DAILY (Patient taking differently: TAKE 1 CAPSULE ONCE DAILY) 180 capsule 3  . psyllium (METAMUCIL) 58.6 % powder Take 1 packet by mouth 3 (three) times daily.    . ranitidine (ZANTAC) 300 MG capsule Take 300 mg by mouth every evening.    . sacubitril-valsartan (ENTRESTO) 97-103 MG Take 1 tablet by mouth 2 (two) times daily. 180 tablet 3  . simvastatin (ZOCOR) 40 MG tablet TAKE 1 TABLET AT BEDTIME 90 tablet 0  . predniSONE (DELTASONE) 50 MG tablet 1 tablet daily x 5 days. (Patient not taking: Reported on 04/27/2018) 5 tablet 0    No facility-administered medications prior to visit.     Review of Systems CNS: No confusion or sedation Cardiac: No angina or palpitations GI: No abdominal pain or constipation Constitutional: No nausea vomiting fevers or chills  Objective:  BP (!) 164/72   Pulse 83   Temp 97.9 F (36.6 C) (Oral)   Resp 16   Ht 5' 9.5" (1.765 m)   Wt 195 lb (88.5 kg)   SpO2 96%   BMI 28.38 kg/m    BP Readings from Last 3 Encounters:  04/27/18 (!) 164/72  04/22/18 (!) 159/78  04/20/18 (!) 163/71     Wt Readings from Last 3 Encounters:  04/27/18 195 lb (88.5 kg)  04/22/18 192 lb 7.4 oz (87.3 kg)  04/20/18 195 lb (88.5 kg)     Physical Exam Pt is alert and oriented PERRL EOMI HEART IS RRR no murmur or rub LCTA no wheezing or rales MUSCULOSKELETAL is without change  Labs  Lab Results  Component Value Date   HGBA1C 7.3 01/02/2018   HGBA1C 7.0 09/30/2017   HGBA1C 7.5 (H) 02/18/2017   Lab Results  Component Value Date   MICROALBUR 50 09/30/2017   LDLCALC 63 09/30/2017   CREATININE 0.80 03/26/2018    -------------------------------------------------------------------------------------------------------------------- Lab Results  Component Value Date   WBC 5.7 09/30/2017   HGB 14.2 09/30/2017  HCT 44.0 09/30/2017   PLT 237 09/30/2017   GLUCOSE 137 (H) 11/25/2017   CHOL 135 09/30/2017   TRIG 154 (H) 09/30/2017   HDL 48 09/30/2017   LDLCALC 63 09/30/2017   ALT 20 09/30/2017   AST 18 09/30/2017   NA 135 11/25/2017   K 4.3 11/25/2017   CL 96 11/25/2017   CREATININE 0.80 03/26/2018   BUN 14 11/25/2017   CO2 20 11/25/2017   TSH 1.09 09/30/2017   INR 0.94 10/26/2015   HGBA1C 7.3 01/02/2018   MICROALBUR 50 09/30/2017    --------------------------------------------------------------------------------------------------------------------- Dg C-arm 1-60 Min-no Report  Result Date: 04/27/2018 Fluoroscopy was utilized by the requesting physician.  No radiographic  interpretation.     Assessment & Plan:   Howard [name] was seen today for back pain.  Diagnoses and all orders for this visit:  Lumbar post-laminectomy syndrome  DDD (degenerative disc disease), lumbar  Sciatica of left side -     triamcinolone acetonide (KENALOG-40) injection 40 mg -     sodium chloride flush (NS) 0.9 % injection 10 mL -     ropivacaine (PF) 2 mg/mL (0.2%) (NAROPIN) injection 10 mL -     midazolam (VERSED) 5 MG/5ML injection 5 mg -     lidocaine (PF) (XYLOCAINE) 1 % injection 5 mL -     lactated ringers infusion 1,000 mL -     iopamidol (ISOVUE-M) 41 % intrathecal injection 20 mL  Chronic left-sided low back pain without sciatica  Spinal stenosis, lumbar region, with neurogenic claudication -     triamcinolone acetonide (KENALOG-40) injection 40 mg -     sodium chloride flush (NS) 0.9 % injection 10 mL -     ropivacaine (PF) 2 mg/mL (0.2%) (NAROPIN) injection 10 mL -     midazolam (VERSED) 5 MG/5ML injection 5 mg -     lidocaine (PF) (XYLOCAINE) 1 % injection 5 mL -     lactated ringers infusion 1,000 mL -     iopamidol (ISOVUE-M) 41 % intrathecal injection 20 mL        ----------------------------------------------------------------------------------------------------------------------  Problem List Items Addressed This Visit      Unprioritized   Spinal stenosis, lumbar region, with neurogenic claudication   Relevant Medications   triamcinolone acetonide (KENALOG-40) injection 40 mg (Completed)   sodium chloride flush (NS) 0.9 % injection 10 mL (Completed)   ropivacaine (PF) 2 mg/mL (0.2%) (NAROPIN) injection 10 mL (Completed)   midazolam (VERSED) 5 MG/5ML injection 5 mg (Completed)   lidocaine (PF) (XYLOCAINE) 1 % injection 5 mL (Completed)   lactated ringers infusion 1,000 mL   iopamidol (ISOVUE-M) 41 % intrathecal injection 20 mL    Other Visit Diagnoses    Lumbar post-laminectomy syndrome    -  Primary   DDD (degenerative disc disease), lumbar        Relevant Medications   triamcinolone acetonide (KENALOG-40) injection 40 mg (Completed)   Sciatica of left side       Relevant Medications   triamcinolone acetonide (KENALOG-40) injection 40 mg (Completed)   sodium chloride flush (NS) 0.9 % injection 10 mL (Completed)   ropivacaine (PF) 2 mg/mL (0.2%) (NAROPIN) injection 10 mL (Completed)   midazolam (VERSED) 5 MG/5ML injection 5 mg (Completed)   lidocaine (PF) (XYLOCAINE) 1 % injection 5 mL (Completed)   lactated ringers infusion 1,000 mL   iopamidol (ISOVUE-M) 41 % intrathecal injection 20 mL   Chronic left-sided low back pain without sciatica       Relevant Medications  triamcinolone acetonide (KENALOG-40) injection 40 mg (Completed)        ----------------------------------------------------------------------------------------------------------------------  1. Lumbar post-laminectomy syndrome We will proceed with an epidural steroid injection today as reviewed with the patient in full detail with all questions answered.  I am return to clinic approximately 1 month for reevaluation possible repeat injection at that time.  2. DDD (degenerative disc disease), lumbar Continue stretching strengthening exercises  3. Sciatica of left side As above with epidural steroid injection today. - triamcinolone acetonide (KENALOG-40) injection 40 mg - sodium chloride flush (NS) 0.9 % injection 10 mL - ropivacaine (PF) 2 mg/mL (0.2%) (NAROPIN) injection 10 mL - midazolam (VERSED) 5 MG/5ML injection 5 mg - lidocaine (PF) (XYLOCAINE) 1 % injection 5 mL - lactated ringers infusion 1,000 mL - iopamidol (ISOVUE-M) 41 % intrathecal injection 20 mL  4. Chronic left-sided low back pain without sciatica As above  5. Spinal stenosis, lumbar region, with neurogenic claudication As above - triamcinolone acetonide (KENALOG-40) injection 40 mg - sodium chloride flush (NS) 0.9 % injection 10 mL - ropivacaine (PF) 2 mg/mL (0.2%) (NAROPIN) injection  10 mL - midazolam (VERSED) 5 MG/5ML injection 5 mg - lidocaine (PF) (XYLOCAINE) 1 % injection 5 mL - lactated ringers infusion 1,000 mL - iopamidol (ISOVUE-M) 41 % intrathecal injection 20 mL    ----------------------------------------------------------------------------------------------------------------------  I am having Howard [name] maintain his multivitamin, Vitamin D3, aspirin, omeprazole, psyllium, ranitidine, hydrochlorothiazide, metFORMIN, simvastatin, metoprolol succinate, sacubitril-valsartan, albuterol, and predniSONE. We administered triamcinolone acetonide, sodium chloride flush, ropivacaine (PF) 2 mg/mL (0.2%), midazolam, lidocaine (PF), lactated ringers, and iopamidol.   Meds ordered this encounter  Medications  . triamcinolone acetonide (KENALOG-40) injection 40 mg  . sodium chloride flush (NS) 0.9 % injection 10 mL  . ropivacaine (PF) 2 mg/mL (0.2%) (NAROPIN) injection 10 mL  . midazolam (VERSED) 5 MG/5ML injection 5 mg  . lidocaine (PF) (XYLOCAINE) 1 % injection 5 mL  . lactated ringers infusion 1,000 mL  . iopamidol (ISOVUE-M) 41 % intrathecal injection 20 mL   Patient's Medications  New Prescriptions   No medications on file  Previous Medications   ALBUTEROL (PROVENTIL HFA;VENTOLIN HFA) 108 (90 BASE) MCG/ACT INHALER    Inhale 1-2 puffs into the lungs every 6 (six) hours as needed for wheezing or shortness of breath.   ASPIRIN 81 MG TABLET    Take 81 mg by mouth daily.     CHOLECALCIFEROL (VITAMIN D3) 1000 UNITS CAPS    Take 1,000 Units by mouth daily.    HYDROCHLOROTHIAZIDE (HYDRODIURIL) 25 MG TABLET    TAKE 1 TABLET DAILY   METFORMIN (GLUCOPHAGE) 500 MG TABLET    TAKE 1 TABLET TWICE A DAY  WITH MEALS   METOPROLOL SUCCINATE (TOPROL-XL) 25 MG 24 HR TABLET    Take 1 tablet (25 mg total) by mouth daily.   MULTIPLE VITAMIN (MULTIVITAMIN) CAPSULE    Take 1 capsule by mouth daily.     OMEPRAZOLE (PRILOSEC) 40 MG CAPSULE    TAKE 1 CAPSULE TWICE DAILY   PREDNISONE  (DELTASONE) 50 MG TABLET    1 tablet daily x 5 days.   PSYLLIUM (METAMUCIL) 58.6 % POWDER    Take 1 packet by mouth 3 (three) times daily.   RANITIDINE (ZANTAC) 300 MG CAPSULE    Take 300 mg by mouth every evening.   SACUBITRIL-VALSARTAN (ENTRESTO) 97-103 MG    Take 1 tablet by mouth 2 (two) times daily.   SIMVASTATIN (ZOCOR) 40 MG TABLET    TAKE 1 TABLET AT  BEDTIME  Modified Medications   No medications on file  Discontinued Medications   No medications on file   ----------------------------------------------------------------------------------------------------------------------  Follow-up: Return for evaluation, procedure.  Procedure: L2-3 epidural steroid under fluoroscopic guidance with moderate sedation   Procedure: L2-3 LESI with fluoroscopic guidance and moderate sedation  NOTE: The risks, benefits, and expectations of the procedure have been discussed and explained to the patient who was understanding and in agreement with suggested treatment plan. No guarantees were made.  DESCRIPTION OF PROCEDURE: Lumbar epidural steroid injection with 1 IV Versed, EKG, blood pressure, pulse, and pulse oximetry monitoring. The procedure was performed with the patient in the prone position under fluoroscopic guidance.  Sterile prep x3 was initiated and I then injected subcutaneous lidocaine to the overlying L2-3 site after its fluoroscopic identifictation.  Using strict aseptic technique, I then advanced an 18-gauge Tuohy epidural needle in the midline using interlaminar approach via loss-of-resistance to saline technique.  This required multiple passes and was very difficult secondary to severe degenerative changes restricting access to the epidural space.  Ultimately I was able to gain access approximately L2-3 level with a right paramedian approach and there was negative aspiration for heme or  CSF.  I then confirmed position with both AP and Lateral fluoroscan.  2 cc of Isovue were injected and a   total of 3 mL of Preservative-Free normal saline mixed with 40 mg of Kenalog and 0cc Ropicaine 0.2 percent were injected incrementally via the  epidurally placed needle. The needle was removed. The patient tolerated the injection well and was convalesced and discharged to home in stable condition. Should the patient have any post procedure difficulty they have been instructed on how to contact us for assistance.    Molli Barrows, MD

## 2018-04-27 NOTE — Progress Notes (Signed)
Safety precautions to be maintained throughout the outpatient stay will include: orient to surroundings, keep bed in low position, maintain call bell within reach at all times, provide assistance with transfer out of bed and ambulation.  

## 2018-05-01 LAB — CUP PACEART REMOTE DEVICE CHECK
Battery Remaining Longevity: 19 mo
Battery Remaining Percentage: 22 %
Brady Statistic AS VP Percent: 96 %
Date Time Interrogation Session: 20190410060316
Implantable Lead Implant Date: 20111208
Implantable Lead Location: 753860
Implantable Pulse Generator Implant Date: 20111208
Lead Channel Impedance Value: 410 Ohm
Lead Channel Pacing Threshold Amplitude: 0.75 V
Lead Channel Pacing Threshold Pulse Width: 0.8 ms
Lead Channel Sensing Intrinsic Amplitude: 3.9 mV
Lead Channel Setting Pacing Amplitude: 2.5 V
Lead Channel Setting Pacing Pulse Width: 0.8 ms
MDC IDC LEAD IMPLANT DT: 20111208
MDC IDC LEAD LOCATION: 753859
MDC IDC MSMT BATTERY VOLTAGE: 2.78 V
MDC IDC MSMT LEADCHNL RA PACING THRESHOLD AMPLITUDE: 0.75 V
MDC IDC MSMT LEADCHNL RA PACING THRESHOLD PULSEWIDTH: 0.4 ms
MDC IDC MSMT LEADCHNL RV IMPEDANCE VALUE: 340 Ohm
MDC IDC MSMT LEADCHNL RV SENSING INTR AMPL: 12 mV
MDC IDC PG SERIAL: 7196739
MDC IDC SET LEADCHNL RA PACING AMPLITUDE: 2 V
MDC IDC SET LEADCHNL RV SENSING SENSITIVITY: 4 mV
MDC IDC STAT BRADY AP VP PERCENT: 3 %
MDC IDC STAT BRADY AP VS PERCENT: 1 %
MDC IDC STAT BRADY AS VS PERCENT: 1 %
MDC IDC STAT BRADY RA PERCENT PACED: 2.5 %
MDC IDC STAT BRADY RV PERCENT PACED: 99 %
Pulse Gen Model: 2210

## 2018-05-05 ENCOUNTER — Encounter: Payer: Self-pay | Admitting: Family Medicine

## 2018-05-05 ENCOUNTER — Ambulatory Visit (INDEPENDENT_AMBULATORY_CARE_PROVIDER_SITE_OTHER): Payer: Medicare Other | Admitting: Family Medicine

## 2018-05-05 VITALS — BP 136/68 | HR 62 | Temp 98.0°F | Wt 192.4 lb

## 2018-05-05 DIAGNOSIS — E782 Mixed hyperlipidemia: Secondary | ICD-10-CM

## 2018-05-05 DIAGNOSIS — E119 Type 2 diabetes mellitus without complications: Secondary | ICD-10-CM | POA: Diagnosis not present

## 2018-05-05 DIAGNOSIS — I10 Essential (primary) hypertension: Secondary | ICD-10-CM | POA: Diagnosis not present

## 2018-05-05 NOTE — Progress Notes (Signed)
Patient: Howard [name] Male    DOB: 13-Aug-1931   84 y.o.   MRN: [id number] Visit Date: 05/05/2018  Today's Provider: [name], PA   Chief Complaint  Patient presents with  . Diabetes  . Hypertension  . Hyperlipidemia  . Follow-up   Subjective:    HPI  Diabetes Mellitus Type II, Follow-up:  Lab Results  Component Value Date   HGBA1C 7.3 01/02/2018   Last seen for diabetes40monthsago.  Management since then includescontinueMetformin 500 mg BID with diabetic diet and exercise. Hereports faircompliance with treatment.  Heis nothaving side effects.  Current symptoms include none Home blood sugar records: fasting range: 130's  Episodes of hypoglycemia? no  Current Insulin Regimen: none Weight trend: stable Current diet: restrictingsweets some Current exercise:none due to back pain  ------------------------------------------------------------------------   Hypertension, follow-up: BP Readings from Last 3 Encounters:  05/05/18 136/68  04/27/18 (!) 161/79  04/22/18 (!) 159/78   Hewas last seen for hypertension53monthsago.  BP at that visit was102/54 Management since that visit includescontinueHCTZ 25 mg qd,Metoprolol-SL 25 mg qd, diet and exercise.  Hereportsfair compliance with treatment. Heis nothaving side effects. Heis notexercising. Heisadherent to low salt diet.  Outside blood pressures are being checked. Heis experiencing none.  Patient denies chest pain, chest pressure/discomfort, irregular heart beat and palpitations.  Cardiovascular risk factors include advanced age (older than 69 for men, 29 for women), diabetes mellitus, dyslipidemia, hypertension and male gender.  Use of agents associated with hypertension: none.   ------------------------------------------------------------------------  Lipid/Cholesterol, Follow-up:   Last seen for this53monthsago.  Management since that visit  includescontinueSimvastatin 40 mg qd, low fat dietand exercise. He reports fair compliance with treatment. He is not having side effects.  Last Lipid Panel: Lab Results  Component Value Date   CHOL 135 09/30/2017   HDL 48 09/30/2017   LDLCALC 63 09/30/2017   TRIG 154 (H) 09/30/2017   CHOLHDL 2.8 09/30/2017   Past Medical History:  Diagnosis Date  . Arthritis   . Bell palsy   . Cancer [facility])    prostate and skin  . Chronic combined systolic and diastolic CHF, NYHA class 1 ([place])    a. 07/2014 Echo: EF 35-40%, Gr 1 DD.  Marland Kitchen Complete heart block ([place])    a. 11/2010 s/p SJM 2210 [facility] DC PPM, ser# [id number].  . Depression   . Diabetes mellitus without complication ([place])   . Fall 11-10-14  . GERD (gastroesophageal reflux disease)   . History of prostate cancer   . Hyperlipidemia   . Hypertension   . LBBB (left bundle branch block)   . Left-sided Bell's palsy   . Lung cancer ([place]) 2016  . NICM (nonischemic cardiomyopathy) ([place])    a. 07/2014 Echo: EF 35-40%, mid-apicalanteroseptal DK, Gr 1 DD, mild-mod dil LA.  . Non-obstructive CAD    a. 07/2014 Abnl MV;  b. 08/2014 Cath: LM nl, LAD 30p, RI 40p, LCX nl, OM1 40, RCA dominant 30p, 70d-->Med Rx.  Marland Kitchen Poor balance   . Presence of permanent cardiac pacemaker   . Sleep apnea    a. cpap  . Vertigo   . WPW (Wolff-Parkinson-White syndrome)    a. S/P RFCA 1991.   Past Surgical History:  Procedure Laterality Date  . APPLICATION OF WOUND VAC Left 06/07/2015   Procedure: APPLICATION OF WOUND VAC;  Surgeon: Robert Bellow, MD;  Location: [facility] ORS;  Service: General;  Laterality: Left;  left upper back  . BACK SURGERY  2011  . CARDIAC CATHETERIZATION  08/26/2014   Single vessel obstructive CAD  . CARPAL TUNNEL RELEASE  04-04-15   [facility]  . CATARACT EXTRACTION  07-31-11 and 09-18-11  . Catheter ablation  1991   for WPW  . cervical fusion    . CHOLECYSTECTOMY  09-07-14  . HAND SURGERY     right 1993; left 2005  . HERNIA REPAIR  1955  .  INSERT / REPLACE / REMOVE PACEMAKER    . JOINT REPLACEMENT Left 2013   knee  . JOINT REPLACEMENT Right 2004   knee  . KNEE SURGERY     left knee 1991 and 1992; right knee 1995  . LEFT HEART CATHETERIZATION WITH CORONARY ANGIOGRAM N/A 08/26/2014   Procedure: LEFT HEART CATHETERIZATION WITH CORONARY ANGIOGRAM;  Surgeon: [name], MD;  Location: [facility] C[facility] LAB;  Service: Cardiovascular;  Laterality: N/A;  . LUMBAR LAMINECTOMY/DECOMPRESSION MICRODISCECTOMY N/A 06/07/2014   Procedure: LUMBAR FOUR TO FIVE LUMBAR LAMINECTOMY/DECOMPRESSION MICRODISCECTOMY 1 LEVEL;  Surgeon: [name], MD;  Location: [place] NEURO ORS;  Service: Neurosurgery;  Laterality: N/A;  . LUNG BIOPSY Right 2016   Dr [name]  . PACEMAKER INSERTION     PPM-- [employer] Jude 11/30/10 by [name]  . PROSTATE SURGERY     cancer--1998, prostatectomy  . REPLACEMENT TOTAL KNEE     2004  . ruptured disc     1962 and 1998  . TRIGGER FINGER RELEASE  01-24-15  . WOUND DEBRIDEMENT Left 06/07/2015   Procedure: DEBRIDEMENT WOUND;  Surgeon: Robert Bellow, MD;  Location: [facility] ORS;  Service: General;  Laterality: Left;  left upper back   Family History  Problem Relation Age of Onset  . Heart attack Mother   . Hyperlipidemia Mother   . CAD Unknown   . Prostate cancer Neg Hx    Allergies  Allergen Reactions  . Sulfa Antibiotics Rash  . Sulfonamide Derivatives Rash    Current Outpatient Medications:  .  albuterol (PROVENTIL HFA;VENTOLIN HFA) 108 (90 Base) MCG/ACT inhaler, Inhale 1-2 puffs into the lungs every 6 (six) hours as needed for wheezing or shortness of breath., Disp: 1 Inhaler, Rfl: 0 .  aspirin 81 MG tablet, Take 81 mg by mouth daily.  , Disp: , Rfl:  .  Cholecalciferol (VITAMIN D3) 1000 UNITS CAPS, Take 1,000 Units by mouth daily. , Disp: , Rfl:  .  hydrochlorothiazide (HYDRODIURIL) 25 MG tablet, TAKE 1 TABLET DAILY, Disp: 90 tablet, Rfl: 3 .  metFORMIN (GLUCOPHAGE) 500 MG tablet, TAKE 1 TABLET TWICE A DAY  WITH MEALS, Disp: 180 tablet,  Rfl: 3 .  metoprolol succinate (TOPROL-XL) 25 MG 24 hr tablet, Take 1 tablet (25 mg total) by mouth daily., Disp: 90 tablet, Rfl: 3 .  Multiple Vitamin (MULTIVITAMIN) capsule, Take 1 capsule by mouth daily.  , Disp: , Rfl:  .  omeprazole (PRILOSEC) 40 MG capsule, TAKE 1 CAPSULE TWICE DAILY (Patient taking differently: TAKE 1 CAPSULE ONCE DAILY), Disp: 180 capsule, Rfl: 3 .  psyllium (METAMUCIL) 58.6 % powder, Take 1 packet by mouth 3 (three) times daily., Disp: , Rfl:  .  ranitidine (ZANTAC) 300 MG capsule, Take 300 mg by mouth every evening., Disp: , Rfl:  .  sacubitril-valsartan (ENTRESTO) 97-103 MG, Take 1 tablet by mouth 2 (two) times daily., Disp: 180 tablet, Rfl: 3 .  simvastatin (ZOCOR) 40 MG tablet, TAKE 1 TABLET AT BEDTIME, Disp: 90 tablet, Rfl: 0  Review of Systems  Constitutional: Negative.   Respiratory: Negative.  Cardiovascular: Negative.   Endocrine: Negative.   Musculoskeletal: Negative.    Social History   Tobacco Use  . Smoking status: Former Smoker    Years: 4.00  . Smokeless tobacco: Never Used  . Tobacco comment: Quit 2011  Substance Use Topics  . Alcohol use: No    Frequency: Never   Objective:   BP 136/68 (BP Location: Right Arm, Patient Position: Sitting, Cuff Size: Normal)   Pulse 62   Temp 98 F (36.7 C) (Oral)   Wt 192 lb 6.4 oz (87.3 kg)   SpO2 98%   BMI 28.01 kg/m  Wt Readings from Last 3 Encounters:  05/05/18 192 lb 6.4 oz (87.3 kg)  04/27/18 195 lb (88.5 kg)  04/22/18 192 lb 7.4 oz (87.3 kg)   Physical Exam  Constitutional: He is oriented to person, place, and time. He appears well-developed and well-nourished. No distress.  HENT:  Head: Normocephalic and atraumatic.  Right Ear: Hearing normal.  Left Ear: Hearing normal.  Nose: Nose normal.  Eyes: Conjunctivae and lids are normal. Right eye exhibits no discharge. Left eye exhibits no discharge. No scleral icterus.  Cardiovascular: Normal rate and regular rhythm.  Pulmonary/Chest:  Effort normal and breath sounds normal. No respiratory distress.  Abdominal: Soft. Bowel sounds are normal.  Musculoskeletal:  Some stiffness in lower back with slight soreness today. Having a series of steroid injections by Dr. Andree Elk at the pain clinic. History of post lumbar laminectomy with spinal stenosis.  Neurological: He is alert and oriented to person, place, and time.  Skin: Skin is intact. No lesion and no rash noted.  Psychiatric: He has a normal mood and affect. His speech is normal and behavior is normal. Thought content normal.       Assessment & Plan:     1. Type 2 diabetes mellitus without complication, without long-term current use of insulin (HCC) FBS in the 130 range at home. No change in urination pattern. Encouraged to get ophthalmology exam next month. Had foot exam and urine microalbumen (50 mg/L) on 09-30-17. Hgb A1C was 7.3 on 01-02-18. Continues to take Metformin 500 mg BID without hypoglycemic episodes. Recheck labs and follow up pending reports.  - CBC with Differential/Platelet - Comprehensive metabolic panel - Hemoglobin A1c - Lipid panel  2. Essential hypertension Well controlled and stable BP. No chest pains, dyspnea or edema. Continues to take the Toprol-XL 25 mg qd, HCTZ 25 mg qd with Entresto 97-103 mg BID (per cardiologist for chronic systolic and diastolic CHF, NYHA class I) and followed by Dr. [name] (cardiologist). Recheck CBC, CMP, TSH and Lipid Panel. Follow up with cardiologist regularly. - CBC with Differential/Platelet - Comprehensive metabolic panel - Lipid panel - TSH  3. Mixed hyperlipidemia Tolerating the Simvastatin 40 mg qd, Metamucil 1 packet TID and a low fat diet. Encouraged to exercise as chronic back pain allows. Recheck labs and schedule follow up pending reports. - CBC with Differential/Platelet - Comprehensive metabolic panel - Lipid panel - TSH       [name], PA  [facility]

## 2018-05-07 ENCOUNTER — Telehealth: Payer: Self-pay

## 2018-05-07 LAB — HEMOGLOBIN A1C
ESTIMATED AVERAGE GLUCOSE: 171 mg/dL
Hgb A1c MFr Bld: 7.6 % — ABNORMAL HIGH (ref 4.8–5.6)

## 2018-05-07 LAB — CBC WITH DIFFERENTIAL/PLATELET
BASOS ABS: 0 10*3/uL (ref 0.0–0.2)
Basos: 1 %
EOS (ABSOLUTE): 0.3 10*3/uL (ref 0.0–0.4)
Eos: 4 %
Hematocrit: 44.1 % (ref 37.5–51.0)
Hemoglobin: 14.7 g/dL (ref 13.0–17.7)
Immature Grans (Abs): 0 10*3/uL (ref 0.0–0.1)
Immature Granulocytes: 0 %
LYMPHS ABS: 2.1 10*3/uL (ref 0.7–3.1)
Lymphs: 32 %
MCH: 26.3 pg — AB (ref 26.6–33.0)
MCHC: 33.3 g/dL (ref 31.5–35.7)
MCV: 79 fL (ref 79–97)
MONOS ABS: 1 10*3/uL — AB (ref 0.1–0.9)
Monocytes: 15 %
NEUTROS ABS: 3.2 10*3/uL (ref 1.4–7.0)
Neutrophils: 48 %
Platelets: 252 10*3/uL (ref 150–379)
RBC: 5.58 x10E6/uL (ref 4.14–5.80)
RDW: 16.6 % — AB (ref 12.3–15.4)
WBC: 6.6 10*3/uL (ref 3.4–10.8)

## 2018-05-07 LAB — LIPID PANEL
CHOL/HDL RATIO: 2.9 ratio (ref 0.0–5.0)
Cholesterol, Total: 170 mg/dL (ref 100–199)
HDL: 58 mg/dL (ref >39)
LDL Calculated: 87 mg/dL (ref 0–99)
TRIGLYCERIDES: 124 mg/dL (ref 0–149)
VLDL Cholesterol Cal: 25 mg/dL (ref 5–40)

## 2018-05-07 LAB — COMPREHENSIVE METABOLIC PANEL
ALBUMIN: 4.7 g/dL (ref 3.5–4.7)
ALK PHOS: 53 IU/L (ref 39–117)
ALT: 13 IU/L (ref 0–44)
AST: 19 IU/L (ref 0–40)
Albumin/Globulin Ratio: 2.2 (ref 1.2–2.2)
BILIRUBIN TOTAL: 0.7 mg/dL (ref 0.0–1.2)
BUN / CREAT RATIO: 14 (ref 10–24)
BUN: 11 mg/dL (ref 8–27)
CHLORIDE: 96 mmol/L (ref 96–106)
CO2: 18 mmol/L — AB (ref 20–29)
CREATININE: 0.8 mg/dL (ref 0.76–1.27)
Calcium: 9.7 mg/dL (ref 8.6–10.2)
GFR calc Af Amer: 94 mL/min/{1.73_m2} (ref >59)
GFR calc non Af Amer: 81 mL/min/{1.73_m2} (ref >59)
GLUCOSE: 117 mg/dL — AB (ref 65–99)
Globulin, Total: 2.1 g/dL (ref 1.5–4.5)
Potassium: 4.5 mmol/L (ref 3.5–5.2)
SODIUM: 134 mmol/L (ref 134–144)
Total Protein: 6.8 g/dL (ref 6.0–8.5)

## 2018-05-07 LAB — TSH: TSH: 1.65 u[IU]/mL (ref 0.450–4.500)

## 2018-05-07 NOTE — Telephone Encounter (Signed)
Advised patient's wife of results.

## 2018-05-07 NOTE — Telephone Encounter (Signed)
-----   Message from [place], [place] sent at 05/07/2018 11:54 AM EDT ----- Cholesterol, thyroid function, kidney function and blood cell counts all normal. Hgb A1C is higher than 4 months ago. Could be the stress of the heart failure causing some increase in Hgb A1C/blood sugars. Continue Metformin and diabetic low fat diet. Should do some regular exercise as cardiologist allows and recheck diabetes in 3 months.

## 2018-05-07 NOTE — Telephone Encounter (Signed)
LMTCB

## 2018-05-21 DIAGNOSIS — H26491 Other secondary cataract, right eye: Secondary | ICD-10-CM | POA: Diagnosis not present

## 2018-05-21 LAB — HM DIABETES EYE EXAM

## 2018-05-22 DIAGNOSIS — D485 Neoplasm of uncertain behavior of skin: Secondary | ICD-10-CM | POA: Diagnosis not present

## 2018-05-22 DIAGNOSIS — L570 Actinic keratosis: Secondary | ICD-10-CM | POA: Diagnosis not present

## 2018-05-22 DIAGNOSIS — X32XXXA Exposure to sunlight, initial encounter: Secondary | ICD-10-CM | POA: Diagnosis not present

## 2018-05-22 DIAGNOSIS — Z08 Encounter for follow-up examination after completed treatment for malignant neoplasm: Secondary | ICD-10-CM | POA: Diagnosis not present

## 2018-05-22 DIAGNOSIS — Z85828 Personal history of other malignant neoplasm of skin: Secondary | ICD-10-CM | POA: Diagnosis not present

## 2018-05-24 NOTE — Progress Notes (Signed)
Cardiology Office Note  Date:  05/26/2018   ID:  Howard [name], DOB [birth year], MRN [id number]  PCP:  Margo Common, PA   Chief Complaint  Patient presents with  . Other    6 month follow up. Patient denies chest pain and SOB. Meds reviewed verbally with patient.     HPI:  Howard [name] is a 84 year old-year-old gentleman with a hx of  DM II complete heart block, status post pacemaker in 2011,  nonischemic cardiomyopathy,  ejection fraction 30-35%   coronary artery disease, 70% distal RCA disease, 30 and 40% lesions in his ramus, LAD  admission to the hospital 09/05/2014  with acute cholecystitis, ileus.  Cardiac catheterization at that time for positive stress test . Lung CA  He presents for follow-up of his CAD  Finished treatment for sinus problems, seen ENT  Seeing pain clinic, had injection  Denies any significant chest pain Curious to see how his heart pump is doing on his current medication regiment Has not been checking his blood pressure at home Denies any significant side effects Previously was doing exercise forever fit several days per week No longer doing exercise  Chronic back pain, Having spinal injections, Not helping Worse with hills Hx of back surgery x 6 Legs lock, particularly on the left side  Previous CT chest 03/2016 Decreasing size of pathology-proven adenocarcinoma of right upper lobe, with associated radiation treatment effects of the lung.  EKG personally reviewed by myself on todays visit Shows paced rhythm rate 69 bpm  Other past medical history reviewed He was in the hospital November 2016 for abscess on his back, had surgery Nodules found in the right lung, biopsy documenting adenocarcinoma 1.3 cm, treated with radiation  Lab work reviewed with him showing total cholesterol 133, hemoglobin A1c 7.1  Previous chest CT scan showing moderate coronary calcifications in the LAD and diagonal branches, ramus Also with at least mild  diffuse aortic atherosclerosis, mild carotid disease  vertigo February 2016  In early September 2015 he was seen in the [place] office with symptoms of chest pain and shortness of breath,  he had a Abnormal Myoview  cardiac catheterization showing details above Echocardiogram showing ejection fraction 35-40%  After discharge she continued to have similar symptoms of epigastric discomfort radiating through to his back Admitted to the hospital for cholecystectomy  PMH:   has a past medical history of Arthritis, Bell palsy, Cancer ([place]), Chronic combined systolic and diastolic CHF, NYHA class 1 ([place]), Complete heart block ([place]), Depression, Diabetes mellitus without complication ([place]), Fall (11-10-14), GERD (gastroesophageal reflux disease), History of prostate cancer, Hyperlipidemia, Hypertension, LBBB (left bundle branch block), Left-sided Bell's palsy, Lung cancer ([place]) (2016), NICM (nonischemic cardiomyopathy) ([place]), Non-obstructive CAD, Poor balance, Presence of permanent cardiac pacemaker, Sleep apnea, Vertigo, and WPW (Wolff-Parkinson-White syndrome).  PSH:    Past Surgical History:  Procedure Laterality Date  . APPLICATION OF WOUND VAC Left 06/07/2015   Procedure: APPLICATION OF WOUND VAC;  Surgeon: Robert Bellow, MD;  Location: [facility] ORS;  Service: General;  Laterality: Left;  left upper back  . BACK SURGERY     2011  . CARDIAC CATHETERIZATION  08/26/2014   Single vessel obstructive CAD  . CARPAL TUNNEL RELEASE  04-04-15   [facility]  . CATARACT EXTRACTION  07-31-11 and 09-18-11  . Catheter ablation  1991   for WPW  . cervical fusion    . CHOLECYSTECTOMY  09-07-14  . HAND SURGERY     right 1993; left 2005  .  HERNIA REPAIR  1955  . INSERT / REPLACE / REMOVE PACEMAKER    . JOINT REPLACEMENT Left 2013   knee  . JOINT REPLACEMENT Right 2004   knee  . KNEE SURGERY     left knee 1991 and 1992; right knee 1995  . LEFT HEART CATHETERIZATION WITH CORONARY ANGIOGRAM N/A 08/26/2014    Procedure: LEFT HEART CATHETERIZATION WITH CORONARY ANGIOGRAM;  Surgeon: [name], MD;  Location: [facility] CATH LAB;  Service: Cardiovascular;  Laterality: N/A;  . LUMBAR LAMINECTOMY/DECOMPRESSION MICRODISCECTOMY N/A 06/07/2014   Procedure: LUMBAR FOUR TO FIVE LUMBAR LAMINECTOMY/DECOMPRESSION MICRODISCECTOMY 1 LEVEL;  Surgeon: [name], MD;  Location: [facility]URO ORS;  Service: Neurosurgery;  Laterality: N/A;  . LUNG BIOPSY Right 2016   Dr [name]  . PACEMAKER INSERTION     PPM-- St Jude 11/30/10 by [name]  . PROSTATE SURGERY     cancer--1998, prostatectomy  . REPLACEMENT TOTAL KNEE     2004  . ruptured disc     1962 and 1998  . TRIGGER FINGER RELEASE  01-24-15  . WOUND DEBRIDEMENT Left 06/07/2015   Procedure: DEBRIDEMENT WOUND;  Surgeon: Robert Bellow, MD;  Location: [facility] ORS;  Service: General;  Laterality: Left;  left upper back    Current Outpatient Medications  Medication Sig Dispense Refill  . albuterol (PROVENTIL HFA;VENTOLIN HFA) 108 (90 Base) MCG/ACT inhaler Inhale 1-2 puffs into the lungs every 6 (six) hours as needed for wheezing or shortness of breath. 1 Inhaler 0  . aspirin 81 MG tablet Take 81 mg by mouth daily.      . Cholecalciferol (VITAMIN D3) 1000 UNITS CAPS Take 1,000 Units by mouth daily.     . hydrochlorothiazide (HYDRODIURIL) 25 MG tablet TAKE 1 TABLET DAILY 90 tablet 3  . metFORMIN (GLUCOPHAGE) 500 MG tablet TAKE 1 TABLET TWICE A DAY  WITH MEALS 180 tablet 3  . metoprolol succinate (TOPROL-XL) 25 MG 24 hr tablet Take 1 tablet (25 mg total) by mouth daily. 90 tablet 3  . Multiple Vitamin (MULTIVITAMIN) capsule Take 1 capsule by mouth daily.      Marland Kitchen omeprazole (PRILOSEC) 40 MG capsule TAKE 1 CAPSULE TWICE DAILY (Patient taking differently: TAKE 1 CAPSULE ONCE DAILY) 180 capsule 3  . psyllium (METAMUCIL) 58.6 % powder Take 1 packet by mouth 3 (three) times daily.    . ranitidine (ZANTAC) 300 MG capsule Take 300 mg by mouth every evening.    . sacubitril-valsartan (ENTRESTO)  97-103 MG Take 1 tablet by mouth 2 (two) times daily. 180 tablet 3  . simvastatin (ZOCOR) 40 MG tablet TAKE 1 TABLET AT BEDTIME 90 tablet 0   No current facility-administered medications for this visit.      Allergies:   Sulfa antibiotics and Sulfonamide derivatives   Social History:  The patient  reports that he has quit smoking. He quit after 4.00 years of use. He has never used smokeless tobacco. He reports that he does not drink alcohol or use drugs.   Family History:   family history includes CAD in his unknown relative; Heart attack in his mother; Hyperlipidemia in his mother.    Review of Systems: Review of Systems  Constitutional: Negative.   Respiratory: Negative.   Cardiovascular: Negative.   Gastrointestinal: Negative.   Musculoskeletal: Positive for back pain and joint pain.  Neurological: Negative.   Psychiatric/Behavioral: Negative.   All other systems reviewed and are negative.    PHYSICAL EXAM: VS:  BP (!) 156/72 (BP Location: Left Arm, Patient  Position: Sitting, Cuff Size: Normal)   Pulse 69   Ht 5' 9.5" (1.765 m)   Wt 194 lb (88 kg)   BMI 28.24 kg/m  , BMI Body mass index is 28.24 kg/m. Constitutional:  oriented to person, place, and time. No distress.  HENT:  Head: Normocephalic and atraumatic.  Eyes:  no discharge. No scleral icterus.  Neck: Normal range of motion. Neck supple. No JVD present.  Cardiovascular: Normal rate, regular rhythm, normal heart sounds and intact distal pulses. Exam reveals no gallop and no friction rub. No edema No murmur heard. Pulmonary/Chest: Effort normaland breath sounds normal. No stridor. No respiratory distress.  no wheezes.  no rales.  no tenderness.  Abdominal: Soft.  no distension.  no tenderness.  Musculoskeletal: Normal range of motion.  no  tenderness or deformity.  Neurological:  normal muscle tone. Coordination normal. No atrophy Skin: Skin is warm and dry. No rash noted. not diaphoretic.  Psychiatric:  normal  mood and affect. behavior is normal. Thought content normal.     Recent Labs: 05/05/2018: ALT 13; BUN 11; Creatinine, Ser 0.80; Hemoglobin 14.7; Platelets 252; Potassium 4.5; Sodium 134; TSH 1.650    Lipid Panel Lab Results  Component Value Date   CHOL 170 05/05/2018   HDL 58 05/05/2018   LDLCALC 87 05/05/2018   TRIG 124 05/05/2018      Wt Readings from Last 3 Encounters:  05/26/18 194 lb (88 kg)  05/05/18 192 lb 6.4 oz (87.3 kg)  04/27/18 195 lb (88.5 kg)       ASSESSMENT AND PLAN:  Coronary artery disease involving native coronary artery of native heart without angina pectoris - Plan: EKG 12-Lead No change in symptoms Not very active at baseline no regular exercise No anginal symptoms, no further workup at this time  Complete heart block ([place]) - Plan: EKG 12-Lead S/p pacemaker Followed by EP Stable, paced rhythm  Essential hypertension - Plan: EKG 12-Lead Blood pressure elevated but he does not check his numbers at home  recommended he check his blood pressures at home and call us with some numbers  Mixed hyperlipidemia Recommended he watch his carbohydrates, needs small weight loss  On simvastatin with LDL at goal  Nonischemic cardiomyopathy (HCC) Continue beta blocker, Entresto We did discuss adding spironolactone, potassium would be a concern Echocardiogram ordered to estimate ejection fraction at his request  Pacemaker-St.Jude Followed by EP  Malignant neoplasm of middle lobe of right lung ([place]) Previous CT scan March 2017  Stable   Total encounter time more than 25 minutes  Greater than 50% was spent in counseling and coordination of care with the patient   Disposition:   F/U  12 months   Orders Placed This Encounter  Procedures  . EKG 12-Lead     Signed, Esmond Plants, M.D., Ph.D. 05/26/2018  [place], [place]

## 2018-05-26 ENCOUNTER — Ambulatory Visit (INDEPENDENT_AMBULATORY_CARE_PROVIDER_SITE_OTHER): Payer: Medicare Other | Admitting: Cardiovascular Disease

## 2018-05-26 ENCOUNTER — Encounter: Payer: Self-pay | Admitting: Cardiovascular Disease

## 2018-05-26 VITALS — BP 156/72 | HR 69 | Ht 69.5 in | Wt 194.0 lb

## 2018-05-26 DIAGNOSIS — E119 Type 2 diabetes mellitus without complications: Secondary | ICD-10-CM | POA: Diagnosis not present

## 2018-05-26 DIAGNOSIS — I420 Dilated cardiomyopathy: Secondary | ICD-10-CM

## 2018-05-26 DIAGNOSIS — E782 Mixed hyperlipidemia: Secondary | ICD-10-CM | POA: Diagnosis not present

## 2018-05-26 DIAGNOSIS — I442 Atrioventricular block, complete: Secondary | ICD-10-CM | POA: Diagnosis not present

## 2018-05-26 DIAGNOSIS — Z950 Presence of cardiac pacemaker: Secondary | ICD-10-CM | POA: Diagnosis not present

## 2018-05-26 DIAGNOSIS — I10 Essential (primary) hypertension: Secondary | ICD-10-CM | POA: Diagnosis not present

## 2018-05-26 NOTE — Patient Instructions (Addendum)
Goal LDL <70  Watch the sugars   Medication Instructions:   No medication changes made  Labwork:  No new labs needed  Testing/Procedures:  We will order an echo for cardiomyopathy   Follow-Up: It was a pleasure seeing you in the office today. Please call us if you have new issues that need to be addressed before your next appt.  [phone]  Your physician wants you to follow-up in: 12 months.  You will receive a reminder letter in the mail two months in advance. If you don't receive a letter, please call our office to schedule the follow-up appointment.  If you need a refill on your cardiac medications before your next appointment, please call your pharmacy.  For educational health videos Log in to : [email or web address] Or : [email or web address], password : [email or web address]

## 2018-05-27 ENCOUNTER — Other Ambulatory Visit: Payer: Self-pay

## 2018-06-02 ENCOUNTER — Other Ambulatory Visit: Payer: Self-pay

## 2018-06-02 ENCOUNTER — Ambulatory Visit (INDEPENDENT_AMBULATORY_CARE_PROVIDER_SITE_OTHER): Payer: Medicare Other

## 2018-06-02 DIAGNOSIS — I420 Dilated cardiomyopathy: Secondary | ICD-10-CM

## 2018-06-04 DIAGNOSIS — L570 Actinic keratosis: Secondary | ICD-10-CM | POA: Diagnosis not present

## 2018-06-10 ENCOUNTER — Ambulatory Visit
Admission: RE | Admit: 2018-06-10 | Discharge: 2018-06-10 | Disposition: A | Discharge status: Home / Self Care | Payer: Medicare Other | Source: Ambulatory Visit | Attending: Anesthesiology | Admitting: Anesthesiology

## 2018-06-10 ENCOUNTER — Ambulatory Visit (HOSPITAL_BASED_OUTPATIENT_CLINIC_OR_DEPARTMENT_OTHER): Payer: Medicare Other | Admitting: Anesthesiology

## 2018-06-10 ENCOUNTER — Encounter: Payer: Self-pay | Admitting: Anesthesiology

## 2018-06-10 ENCOUNTER — Other Ambulatory Visit: Payer: Self-pay | Admitting: Anesthesiology

## 2018-06-10 VITALS — BP 147/85 | HR 72 | Temp 98.4°F | Resp 16 | Ht 69.5 in | Wt 195.0 lb

## 2018-06-10 DIAGNOSIS — M48062 Spinal stenosis, lumbar region with neurogenic claudication: Secondary | ICD-10-CM

## 2018-06-10 DIAGNOSIS — M545 Low back pain, unspecified: Secondary | ICD-10-CM

## 2018-06-10 DIAGNOSIS — G8929 Other chronic pain: Secondary | ICD-10-CM | POA: Insufficient documentation

## 2018-06-10 DIAGNOSIS — R52 Pain, unspecified: Secondary | ICD-10-CM | POA: Diagnosis present

## 2018-06-10 DIAGNOSIS — Z7982 Long term (current) use of aspirin: Secondary | ICD-10-CM | POA: Diagnosis not present

## 2018-06-10 DIAGNOSIS — Z7984 Long term (current) use of oral hypoglycemic drugs: Secondary | ICD-10-CM | POA: Diagnosis not present

## 2018-06-10 DIAGNOSIS — M5136 Other intervertebral disc degeneration, lumbar region: Secondary | ICD-10-CM | POA: Insufficient documentation

## 2018-06-10 DIAGNOSIS — M5432 Sciatica, left side: Secondary | ICD-10-CM | POA: Insufficient documentation

## 2018-06-10 DIAGNOSIS — M961 Postlaminectomy syndrome, not elsewhere classified: Secondary | ICD-10-CM | POA: Diagnosis not present

## 2018-06-10 DIAGNOSIS — Z79899 Other long term (current) drug therapy: Secondary | ICD-10-CM | POA: Insufficient documentation

## 2018-06-10 NOTE — Patient Instructions (Signed)
____________________________________________________________________________________________  General Risks and Possible Complications  Patient Responsibilities: It is important that you read this as it is part of your informed consent. It is our duty to inform you of the risks and possible complications associated with treatments offered to you. It is your responsibility as a patient to read this and to ask questions about anything that is not clear or that you believe was not covered in this document.  Patient's Rights: You have the right to refuse treatment. You also have the right to change your mind, even after initially having agreed to have the treatment done. However, under this last option, if you wait until the last second to change your mind, you may be charged for the materials used up to that point.  Introduction: Medicine is not an exact science. Everything in Medicine, including the lack of treatment(s), carries the potential for danger, harm, or loss (which is by definition: Risk). In Medicine, a complication is a secondary problem, condition, or disease that can aggravate an already existing one. All treatments carry the risk of possible complications. The fact that a side effects or complications occurs, does not imply that the treatment was conducted incorrectly. It must be clearly understood that these can happen even when everything is done following the highest safety standards.  No treatment: You can choose not to proceed with the proposed treatment alternative. The "PRO(s)" would include: avoiding the risk of complications associated with the therapy. The "CON(s)" would include: not getting any of the treatment benefits. These benefits fall under one of three categories: diagnostic; therapeutic; and/or palliative. Diagnostic benefits include: getting information which can ultimately lead to improvement of the disease or symptom(s). Therapeutic benefits are those associated with the  successful treatment of the disease. Finally, palliative benefits are those related to the decrease of the primary symptoms, without necessarily curing the condition (example: decreasing the pain from a flare-up of a chronic condition, such as incurable terminal cancer).  General Risks and Complications: These are associated to most interventional treatments. They can occur alone, or in combination. They fall under one of the following six (6) categories: no benefit or worsening of symptoms; bleeding; infection; nerve damage; allergic reactions; and/or death. 1. No benefits or worsening of symptoms: In Medicine there are no guarantees, only probabilities. No healthcare provider can ever guarantee that a medical treatment will work, they can only state the probability that it may. Furthermore, there is always the possibility that the condition may worsen, either directly, or indirectly, as a consequence of the treatment. 2. Bleeding: This is more common if the patient is taking a blood thinner, either prescription or over the counter (example: Goody Powders, Fish oil, Aspirin, Garlic, etc.), or if suffering a condition associated with impaired coagulation (example: Hemophilia, cirrhosis of the liver, low platelet counts, etc.). However, even if you do not have one on these, it can still happen. If you have any of these conditions, or take one of these drugs, make sure to notify your treating physician. 3. Infection: This is more common in patients with a compromised immune system, either due to disease (example: diabetes, cancer, human immunodeficiency virus [HIV], etc.), or due to medications or treatments (example: therapies used to treat cancer and rheumatological diseases). However, even if you do not have one on these, it can still happen. If you have any of these conditions, or take one of these drugs, make sure to notify your treating physician. 4. Nerve Damage: This is more common when the   treatment is  an invasive one, but it can also happen with the use of medications, such as those used in the treatment of cancer. The damage can occur to small secondary nerves, or to large primary ones, such as those in the spinal cord and brain. This damage may be temporary or permanent and it may lead to impairments that can range from temporary numbness to permanent paralysis and/or brain death. 5. Allergic Reactions: Any time a substance or material comes in contact with our body, there is the possibility of an allergic reaction. These can range from a mild skin rash (contact dermatitis) to a severe systemic reaction (anaphylactic reaction), which can result in death. 6. Death: In general, any medical intervention can result in death, most of the time due to an unforeseen complication. ____________________________________________________________________________________________  Epidural Steroid Injection Patient Information  Description: The epidural space surrounds the nerves as they exit the spinal cord.  In some patients, the nerves can be compressed and inflamed by a bulging disc or a tight spinal canal (spinal stenosis).  By injecting steroids into the epidural space, we can bring irritated nerves into direct contact with a potentially helpful medication.  These steroids act directly on the irritated nerves and can reduce swelling and inflammation which often leads to decreased pain.  Epidural steroids may be injected anywhere along the spine and from the neck to the low back depending upon the location of your pain.   After numbing the skin with local anesthetic (like Novocaine), a small needle is passed into the epidural space slowly.  You may experience a sensation of pressure while this is being done.  The entire block usually last less than 10 minutes.  Conditions which may be treated by epidural steroids:   Low back and leg pain  Neck and arm pain  Spinal stenosis  Post-laminectomy  syndrome  Herpes zoster (shingles) pain  Pain from compression fractures  Preparation for the injection:  1. Do not eat any solid food or dairy products within 8 hours of your appointment.  2. You may drink clear liquids up to 3 hours before appointment.  Clear liquids include water, black coffee, juice or soda.  No milk or cream please. 3. You may take your regular medication, including pain medications, with a sip of water before your appointment  Diabetics should hold regular insulin (if taken separately) and take 1/2 normal NPH dos the morning of the procedure.  Carry some sugar containing items with you to your appointment. 4. A driver must accompany you and be prepared to drive you home after your procedure.  5. Bring all your current medications with your. 6. An IV may be inserted and sedation may be given at the discretion of the physician.   7. A blood pressure cuff, EKG and other monitors will often be applied during the procedure.  Some patients may need to have extra oxygen administered for a short period. 8. You will be asked to provide medical information, including your allergies, prior to the procedure.  We must know immediately if you are taking blood thinners (like Coumadin/Warfarin)  Or if you are allergic to IV iodine contrast (dye). We must know if you could possible be pregnant.  Possible side-effects:  Bleeding from needle site  Infection (rare, may require surgery)  Nerve injury (rare)  Numbness & tingling (temporary)  Difficulty urinating (rare, temporary)  Spinal headache ( a headache worse with upright posture)  Light -headedness (temporary)  Pain at injection site (several  days)  Decreased blood pressure (temporary)  Weakness in arm/leg (temporary)  Pressure sensation in back/neck (temporary)  Call if you experience:  Fever/chills associated with headache or increased back/neck pain.  Headache worsened by an upright position.  New onset  weakness or numbness of an extremity below the injection site  Hives or difficulty breathing (go to the emergency room)  Inflammation or drainage at the infection site  Severe back/neck pain  Any new symptoms which are concerning to you  Please note:  Although the local anesthetic injected can often make your back or neck feel good for several hours after the injection, the pain will likely return.  It takes 3-7 days for steroids to work in the epidural space.  You may not notice any pain relief for at least that one week.  If effective, we will often do a series of three injections spaced 3-6 weeks apart to maximally decrease your pain.  After the initial series, we generally will wait several months before considering a repeat injection of the same type.  If you have any questions, please call [phone] [facility]

## 2018-06-10 NOTE — Progress Notes (Signed)
Safety precautions to be maintained throughout the outpatient stay will include: orient to surroundings, keep bed in low position, maintain call bell within reach at all times, provide assistance with transfer out of bed and ambulation.  

## 2018-06-10 NOTE — Progress Notes (Signed)
Subjective:  Patient ID: Howard [name], male    DOB: 26-Mar-1931  Age: 84 y.o. MRN: [id number]  CC: Back Pain (lower left back and thigh)   Procedure: None  HPI Howard [name] presents for reevaluation.  He was last seen a few months ago and had his first lumbar epidural at that time.  He states that his low back pain has essentially resolved.  That was a significant part of his pain complaint.  He is still getting some anterior thigh pain worse with prolonged standing and walking.  This does improve upon taking a break and sitting.  Otherwise he is in his usual state of health and no new changes in bowel bladder function or lower extremity strength or function.  Outpatient Medications Prior to Visit  Medication Sig Dispense Refill  . albuterol (PROVENTIL HFA;VENTOLIN HFA) 108 (90 Base) MCG/ACT inhaler Inhale 1-2 puffs into the lungs every 6 (six) hours as needed for wheezing or shortness of breath. 1 Inhaler 0  . aspirin 81 MG tablet Take 81 mg by mouth daily.      . Cholecalciferol (VITAMIN D3) 1000 UNITS CAPS Take 1,000 Units by mouth daily.     . hydrochlorothiazide (HYDRODIURIL) 25 MG tablet TAKE 1 TABLET DAILY 90 tablet 3  . metFORMIN (GLUCOPHAGE) 500 MG tablet TAKE 1 TABLET TWICE A DAY  WITH MEALS 180 tablet 3  . metoprolol succinate (TOPROL-XL) 25 MG 24 hr tablet Take 1 tablet (25 mg total) by mouth daily. 90 tablet 3  . Multiple Vitamin (MULTIVITAMIN) capsule Take 1 capsule by mouth daily.      Marland Kitchen omeprazole (PRILOSEC) 40 MG capsule TAKE 1 CAPSULE TWICE DAILY (Patient taking differently: TAKE 1 CAPSULE ONCE DAILY) 180 capsule 3  . psyllium (METAMUCIL) 58.6 % powder Take 1 packet by mouth 3 (three) times daily.    . ranitidine (ZANTAC) 300 MG capsule Take 300 mg by mouth every evening.    . sacubitril-valsartan (ENTRESTO) 97-103 MG Take 1 tablet by mouth 2 (two) times daily. 180 tablet 3  . simvastatin (ZOCOR) 40 MG tablet TAKE 1 TABLET AT BEDTIME 90 tablet 0   No  facility-administered medications prior to visit.     Review of Systems CNS: No confusion or sedation Cardiac: No angina or palpitations GI: No abdominal pain or constipation Constitutional: No nausea vomiting fevers or chills  Objective:  BP (!) 147/85 (BP Location: Left Arm, Patient Position: Sitting, Cuff Size: Normal)   Pulse 72   Temp 98.4 F (36.9 C) (Oral)   Resp 16   Ht 5' 9.5" (1.765 m)   Wt 195 lb (88.5 kg)   SpO2 96%   BMI 28.38 kg/m    BP Readings from Last 3 Encounters:  06/10/18 (!) 147/85  05/26/18 (!) 156/72  05/05/18 136/68     Wt Readings from Last 3 Encounters:  06/10/18 195 lb (88.5 kg)  05/26/18 194 lb (88 kg)  05/05/18 192 lb 6.4 oz (87.3 kg)     Physical Exam Pt is alert and oriented PERRL EOMI HEART IS RRR no murmur or rub LCTA no wheezing or rales MUSCULOSKELETAL reveals good muscle tone and bulk as per previous evaluation.  Labs  Lab Results  Component Value Date   HGBA1C 7.6 (H) 05/05/2018   HGBA1C 7.3 01/02/2018   HGBA1C 7.0 09/30/2017   Lab Results  Component Value Date   MICROALBUR 50 09/30/2017   LDLCALC 87 05/05/2018   CREATININE 0.80 05/05/2018    -------------------------------------------------------------------------------------------------------------------- Lab Results  Component Value Date   WBC 6.6 05/05/2018   HGB 14.7 05/05/2018   HCT 44.1 05/05/2018   PLT 252 05/05/2018   GLUCOSE 117 (H) 05/05/2018   CHOL 170 05/05/2018   TRIG 124 05/05/2018   HDL 58 05/05/2018   LDLCALC 87 05/05/2018   ALT 13 05/05/2018   AST 19 05/05/2018   NA 134 05/05/2018   K 4.5 05/05/2018   CL 96 05/05/2018   CREATININE 0.80 05/05/2018   BUN 11 05/05/2018   CO2 18 (L) 05/05/2018   TSH 1.650 05/05/2018   INR 0.94 10/26/2015   HGBA1C 7.6 (H) 05/05/2018   MICROALBUR 50 09/30/2017    --------------------------------------------------------------------------------------------------------------------- No results  found.   Assessment & Plan:   Howard [name] was seen today for back pain.  Diagnoses and all orders for this visit:  Lumbar post-laminectomy syndrome  DDD (degenerative disc disease), lumbar  Sciatica of left side -     Lumbar Epidural Injection; Future  Chronic left-sided low back pain without sciatica  Spinal stenosis, lumbar region, with neurogenic claudication -     Lumbar Epidural Injection; Future        ----------------------------------------------------------------------------------------------------------------------  Problem List Items Addressed This Visit      Unprioritized   Spinal stenosis, lumbar region, with neurogenic claudication   Relevant Orders   Lumbar Epidural Injection    Other Visit Diagnoses    Lumbar post-laminectomy syndrome    -  Primary   DDD (degenerative disc disease), lumbar       Sciatica of left side       Relevant Orders   Lumbar Epidural Injection   Chronic left-sided low back pain without sciatica            ----------------------------------------------------------------------------------------------------------------------  1. Lumbar post-laminectomy syndrome We will proceed with a repeat injection at his next visit.  I am going to do this to be at the caudal route to see if we can get better coverage for the lower extremity symptoms and easier access to the epidural space as opposed to the lumbar route as we experienced last time.  2. DDD (degenerative disc disease), lumbar Tinea with core stretching strengthening exercises once again reviewed today  3. Sciatica of left side As above - Lumbar Epidural Injection; Future  4. Chronic left-sided low back pain without sciatica   5. Spinal stenosis, lumbar region, with neurogenic claudication As above - Lumbar Epidural Injection; Future    ----------------------------------------------------------------------------------------------------------------------  I am having  Howard [name] maintain his multivitamin, Vitamin D3, aspirin, omeprazole, psyllium, ranitidine, hydrochlorothiazide, metFORMIN, simvastatin, metoprolol succinate, sacubitril-valsartan, and albuterol.   No orders of the defined types were placed in this encounter.  Patient's Medications  New Prescriptions   No medications on file  Previous Medications   ALBUTEROL (PROVENTIL HFA;VENTOLIN HFA) 108 (90 BASE) MCG/ACT INHALER    Inhale 1-2 puffs into the lungs every 6 (six) hours as needed for wheezing or shortness of breath.   ASPIRIN 81 MG TABLET    Take 81 mg by mouth daily.     CHOLECALCIFEROL (VITAMIN D3) 1000 UNITS CAPS    Take 1,000 Units by mouth daily.    HYDROCHLOROTHIAZIDE (HYDRODIURIL) 25 MG TABLET    TAKE 1 TABLET DAILY   METFORMIN (GLUCOPHAGE) 500 MG TABLET    TAKE 1 TABLET TWICE A DAY  WITH MEALS   METOPROLOL SUCCINATE (TOPROL-XL) 25 MG 24 HR TABLET    Take 1 tablet (25 mg total) by mouth daily.   MULTIPLE VITAMIN (MULTIVITAMIN) CAPSULE  Take 1 capsule by mouth daily.     OMEPRAZOLE (PRILOSEC) 40 MG CAPSULE    TAKE 1 CAPSULE TWICE DAILY   PSYLLIUM (METAMUCIL) 58.6 % POWDER    Take 1 packet by mouth 3 (three) times daily.   RANITIDINE (ZANTAC) 300 MG CAPSULE    Take 300 mg by mouth every evening.   SACUBITRIL-VALSARTAN (ENTRESTO) 97-103 MG    Take 1 tablet by mouth 2 (two) times daily.   SIMVASTATIN (ZOCOR) 40 MG TABLET    TAKE 1 TABLET AT BEDTIME  Modified Medications   No medications on file  Discontinued Medications   No medications on file   ----------------------------------------------------------------------------------------------------------------------  Follow-up: Return in about 1 month (around 07/10/2018) for evaluation, procedure.    Molli Barrows, MD

## 2018-07-01 ENCOUNTER — Ambulatory Visit (INDEPENDENT_AMBULATORY_CARE_PROVIDER_SITE_OTHER): Payer: Medicare Other | Admitting: *Deleted

## 2018-07-01 DIAGNOSIS — I420 Dilated cardiomyopathy: Secondary | ICD-10-CM

## 2018-07-01 DIAGNOSIS — I442 Atrioventricular block, complete: Secondary | ICD-10-CM | POA: Diagnosis not present

## 2018-07-01 NOTE — Progress Notes (Signed)
Remote pacemaker transmission.   

## 2018-07-10 DIAGNOSIS — J339 Nasal polyp, unspecified: Secondary | ICD-10-CM | POA: Diagnosis not present

## 2018-07-10 DIAGNOSIS — D385 Neoplasm of uncertain behavior of other respiratory organs: Secondary | ICD-10-CM | POA: Diagnosis not present

## 2018-07-14 ENCOUNTER — Other Ambulatory Visit: Payer: Self-pay | Admitting: Anesthesiology

## 2018-07-14 ENCOUNTER — Ambulatory Visit (HOSPITAL_BASED_OUTPATIENT_CLINIC_OR_DEPARTMENT_OTHER): Payer: Medicare Other | Admitting: Anesthesiology

## 2018-07-14 ENCOUNTER — Other Ambulatory Visit: Payer: Self-pay

## 2018-07-14 ENCOUNTER — Encounter: Payer: Self-pay | Admitting: Anesthesiology

## 2018-07-14 ENCOUNTER — Ambulatory Visit
Admission: RE | Admit: 2018-07-14 | Discharge: 2018-07-14 | Disposition: A | Discharge status: Home / Self Care | Payer: Medicare Other | Source: Ambulatory Visit | Attending: Anesthesiology | Admitting: Anesthesiology

## 2018-07-14 VITALS — BP 178/97 | HR 80 | Temp 97.8°F | Resp 21 | Ht 69.0 in | Wt 195.0 lb

## 2018-07-14 DIAGNOSIS — M5136 Other intervertebral disc degeneration, lumbar region: Secondary | ICD-10-CM

## 2018-07-14 DIAGNOSIS — M48062 Spinal stenosis, lumbar region with neurogenic claudication: Secondary | ICD-10-CM

## 2018-07-14 DIAGNOSIS — M545 Low back pain, unspecified: Secondary | ICD-10-CM

## 2018-07-14 DIAGNOSIS — G8929 Other chronic pain: Secondary | ICD-10-CM

## 2018-07-14 DIAGNOSIS — M961 Postlaminectomy syndrome, not elsewhere classified: Secondary | ICD-10-CM

## 2018-07-14 DIAGNOSIS — Z7983 Long term (current) use of bisphosphonates: Secondary | ICD-10-CM | POA: Diagnosis not present

## 2018-07-14 DIAGNOSIS — M5432 Sciatica, left side: Secondary | ICD-10-CM | POA: Insufficient documentation

## 2018-07-14 DIAGNOSIS — Z7982 Long term (current) use of aspirin: Secondary | ICD-10-CM | POA: Insufficient documentation

## 2018-07-14 DIAGNOSIS — R52 Pain, unspecified: Secondary | ICD-10-CM

## 2018-07-14 DIAGNOSIS — Z7984 Long term (current) use of oral hypoglycemic drugs: Secondary | ICD-10-CM | POA: Diagnosis not present

## 2018-07-14 DIAGNOSIS — M79605 Pain in left leg: Secondary | ICD-10-CM | POA: Diagnosis not present

## 2018-07-14 DIAGNOSIS — M79604 Pain in right leg: Secondary | ICD-10-CM | POA: Insufficient documentation

## 2018-07-14 DIAGNOSIS — Z79899 Other long term (current) drug therapy: Secondary | ICD-10-CM | POA: Diagnosis not present

## 2018-07-14 MED ORDER — ROPIVACAINE HCL 2 MG/ML IJ SOLN
10.0000 mL | Freq: Once | INTRAMUSCULAR | Status: AC
  Administered 2018-07-14: 10 mL via EPIDURAL

## 2018-07-14 MED ORDER — IOPAMIDOL (ISOVUE-M 200) INJECTION 41%
20.0000 mL | Freq: Once | INTRAMUSCULAR | Status: DC | PRN
  Administered 2018-07-14: 10 mL
  Filled 2018-07-14: qty 20

## 2018-07-14 MED ORDER — TRIAMCINOLONE ACETONIDE 40 MG/ML IJ SUSP
40.0000 mg | Freq: Once | INTRAMUSCULAR | Status: AC
  Administered 2018-07-14: 40 mg

## 2018-07-14 MED ORDER — LIDOCAINE HCL (PF) 1 % IJ SOLN
5.0000 mL | Freq: Once | INTRAMUSCULAR | Status: AC
  Administered 2018-07-14: 5 mL via SUBCUTANEOUS

## 2018-07-14 MED ORDER — LIDOCAINE HCL (PF) 1 % IJ SOLN
INTRAMUSCULAR | Status: AC
  Filled 2018-07-14: qty 5

## 2018-07-14 MED ORDER — SODIUM CHLORIDE 0.9% FLUSH
10.0000 mL | Freq: Once | INTRAVENOUS | Status: AC
  Administered 2018-07-14: 5 mL

## 2018-07-14 MED ORDER — IOPAMIDOL (ISOVUE-M 200) INJECTION 41%
INTRAMUSCULAR | Status: AC
  Filled 2018-07-14: qty 10

## 2018-07-14 MED ORDER — TRIAMCINOLONE ACETONIDE 40 MG/ML IJ SUSP
INTRAMUSCULAR | Status: AC
  Filled 2018-07-14: qty 1

## 2018-07-14 MED ORDER — SODIUM CHLORIDE 0.9 % IJ SOLN
INTRAMUSCULAR | Status: AC
  Filled 2018-07-14: qty 10

## 2018-07-14 MED ORDER — ROPIVACAINE HCL 2 MG/ML IJ SOLN
INTRAMUSCULAR | Status: AC
  Filled 2018-07-14: qty 10

## 2018-07-14 NOTE — Progress Notes (Signed)
Safety precautions to be maintained throughout the outpatient stay will include: orient to surroundings, keep bed in low position, maintain call bell within reach at all times, provide assistance with transfer out of bed and ambulation.  

## 2018-07-15 ENCOUNTER — Other Ambulatory Visit: Payer: Self-pay | Admitting: Internal Medicine

## 2018-07-16 NOTE — Progress Notes (Signed)
Subjective:  [name], male    DOB: 02-Jul-1931  Age: 84 y.o. MRN: [id number]  CC: Leg Pain (bilateral, upper)   Procedure: L3-4 epidural steroid under fluoroscopic guidance with out moderate sedation  HPI [name] presents for evaluation.  He was last seen in June and is had 2 caudal epidural steroid injections.  He feels that he gets good relief with the injections and has improved lower extremity and low back pain following the injections generally at about 75% lasting 3 to 4 weeks.  He is able to be more active and sleeps better following the injections and has failed conservative therapy where his medication management alone did not give him much relief.  Unfortunately, physical therapy also was ineffective.  He is done well with the injections in the quality of the pain is less severe than originally however he does have problematic pain throughout the day.  Otherwise no change in lower extremity strength or function or bowel or bladder function is noted at this time.  Outpatient Medications Prior to Visit  Medication Sig Dispense Refill  . albuterol (PROVENTIL HFA;VENTOLIN HFA) 108 (90 Base) MCG/ACT inhaler Inhale 1-2 puffs into the lungs every 6 (six) hours as needed for wheezing or shortness of breath. 1 Inhaler 0  . aspirin 81 MG tablet Take 81 mg by mouth daily.      . Cholecalciferol (VITAMIN D3) 1000 UNITS CAPS Take 1,000 Units by mouth daily.     . hydrochlorothiazide (HYDRODIURIL) 25 MG tablet TAKE 1 TABLET DAILY 90 tablet 3  . metFORMIN (GLUCOPHAGE) 500 MG tablet TAKE 1 TABLET TWICE A DAY  WITH MEALS 180 tablet 3  . metoprolol succinate (TOPROL-XL) 25 MG 24 hr tablet Take 1 tablet (25 mg total) by mouth daily. 90 tablet 3  . Multiple Vitamin (MULTIVITAMIN) capsule Take 1 capsule by mouth daily.      Marland Kitchen omeprazole (PRILOSEC) 40 MG capsule TAKE 1 CAPSULE TWICE DAILY (Patient taking differently: TAKE 1 CAPSULE ONCE DAILY) 180 capsule 3  . psyllium (METAMUCIL)  58.6 % powder Take 1 packet by mouth 3 (three) times daily.    . ranitidine (ZANTAC) 300 MG capsule Take 300 mg by mouth every evening.    . sacubitril-valsartan (ENTRESTO) 97-103 MG Take 1 tablet by mouth 2 (two) times daily. 180 tablet 3  . simvastatin (ZOCOR) 40 MG tablet TAKE 1 TABLET AT BEDTIME 90 tablet 0   No facility-administered medications prior to visit.     Review of Systems CNS: No confusion or sedation Cardiac: No angina or palpitations GI: No abdominal pain or constipation Constitutional: No nausea vomiting fevers or chills  Objective:  BP (!) 178/97 Comment: taken while sitting up on side of bed.   Pulse 80   Temp 97.8 F (36.6 C) (Oral)   Resp (!) 21   Ht 5\' 9"  (1.753 m)   Wt 195 lb (88.5 kg)   SpO2 96%   BMI 28.80 kg/m    BP Readings from Last 3 Encounters:  07/14/18 (!) 178/97  06/10/18 (!) 147/85  05/26/18 (!) 156/72     Wt Readings from Last 3 Encounters:  07/14/18 195 lb (88.5 kg)  06/10/18 195 lb (88.5 kg)  05/26/18 194 lb (88 kg)     Physical Exam Pt is alert and oriented PERRL EOMI HEART IS RRR no murmur or rub LCTA no wheezing or rales MUSCULOSKELETAL reveals some paraspinous muscle tenderness in the lumbar region but no overt trigger points.  He still has some pain with extension at the low back and his lower extremity muscle tone and bulk is at baseline.  Labs  Lab Results  Component Value Date   HGBA1C 7.6 (H) 05/05/2018   HGBA1C 7.3 01/02/2018   HGBA1C 7.0 09/30/2017   Lab Results  Component Value Date   MICROALBUR 50 09/30/2017   LDLCALC 87 05/05/2018   CREATININE 0.80 05/05/2018    -------------------------------------------------------------------------------------------------------------------- Lab Results  Component Value Date   WBC 6.6 05/05/2018   HGB 14.7 05/05/2018   HCT 44.1 05/05/2018   PLT 252 05/05/2018   GLUCOSE 117 (H) 05/05/2018   CHOL 170 05/05/2018   TRIG 124 05/05/2018   HDL 58 05/05/2018    LDLCALC 87 05/05/2018   ALT 13 05/05/2018   AST 19 05/05/2018   NA 134 05/05/2018   K 4.5 05/05/2018   CL 96 05/05/2018   CREATININE 0.80 05/05/2018   BUN 11 05/05/2018   CO2 18 (L) 05/05/2018   TSH 1.650 05/05/2018   INR 0.94 10/26/2015   HGBA1C 7.6 (H) 05/05/2018   MICROALBUR 50 09/30/2017    --------------------------------------------------------------------------------------------------------------------- Dg C-arm 1-60 Min-no Report  Result Date: 07/14/2018 Fluoroscopy was utilized by the requesting physician.  No radiographic interpretation.     Assessment & Plan:   Ova was seen today for leg pain.  Diagnoses and all orders for this visit:  Lumbar post-laminectomy syndrome  DDD (degenerative disc disease), lumbar  Sciatica of left side -     Lumbar Epidural Injection  Chronic left-sided low back pain without sciatica  Spinal stenosis, lumbar region, with neurogenic claudication -     Lumbar Epidural Injection  Other orders -     triamcinolone acetonide (KENALOG-40) injection 40 mg -     ropivacaine (PF) 2 mg/mL (0.2%) (NAROPIN) injection 10 mL -     sodium chloride flush (NS) 0.9 % injection 10 mL -     lidocaine (PF) (XYLOCAINE) 1 % injection 5 mL -     iopamidol (ISOVUE-M) 41 % intrathecal injection 20 mL        ----------------------------------------------------------------------------------------------------------------------  Problem List Items Addressed This Visit      Unprioritized   Spinal stenosis, lumbar region, with neurogenic claudication   Relevant Medications   triamcinolone acetonide (KENALOG-40) injection 40 mg (Completed)    Other Visit Diagnoses    Lumbar post-laminectomy syndrome    -  Primary   DDD (degenerative disc disease), lumbar       Relevant Medications   triamcinolone acetonide (KENALOG-40) injection 40 mg (Completed)   Sciatica of left side       Chronic left-sided low back pain without sciatica       Relevant  Medications   triamcinolone acetonide (KENALOG-40) injection 40 mg (Completed)        ----------------------------------------------------------------------------------------------------------------------  1. Lumbar post-laminectomy syndrome We will proceed with an L3-4 epidural injection today to see if we can give him more symptomatic relief from the anterior thigh pain that he is experiencing.  We have also gone over the risks and benefits of the procedure with him in full detail and all questions answered.  We will have him return to clinic in 2 months for reevaluation possible repeat injection at that time.  2. DDD (degenerative disc disease), lumbar As above and continue with physical therapy exercises as tolerated and ambulation.  3. Sciatica of left side As above - Lumbar Epidural Injection  4. Chronic left-sided low back pain without sciatica As above  5.  Spinal stenosis, lumbar region, with neurogenic claudication As above - Lumbar Epidural Injection    ----------------------------------------------------------------------------------------------------------------------  I am having [name] maintain his multivitamin, Vitamin D3, aspirin, omeprazole, psyllium, ranitidine, hydrochlorothiazide, metFORMIN, simvastatin, metoprolol succinate, sacubitril-valsartan, and albuterol. We administered triamcinolone acetonide, ropivacaine (PF) 2 mg/mL (0.2%), sodium chloride flush, lidocaine (PF), and iopamidol.   Meds ordered this encounter  Medications  . triamcinolone acetonide (KENALOG-40) injection 40 mg  . ropivacaine (PF) 2 mg/mL (0.2%) (NAROPIN) injection 10 mL  . sodium chloride flush (NS) 0.9 % injection 10 mL  . lidocaine (PF) (XYLOCAINE) 1 % injection 5 mL  . iopamidol (ISOVUE-M) 41 % intrathecal injection 20 mL   Patient's Medications  New Prescriptions   No medications on file  Previous Medications   ALBUTEROL (PROVENTIL HFA;VENTOLIN HFA) 108 (90 BASE)  MCG/ACT INHALER    Inhale 1-2 puffs into the lungs every 6 (six) hours as needed for wheezing or shortness of breath.   ASPIRIN 81 MG TABLET    Take 81 mg by mouth daily.     CHOLECALCIFEROL (VITAMIN D3) 1000 UNITS CAPS    Take 1,000 Units by mouth daily.    HYDROCHLOROTHIAZIDE (HYDRODIURIL) 25 MG TABLET    TAKE 1 TABLET DAILY   METFORMIN (GLUCOPHAGE) 500 MG TABLET    TAKE 1 TABLET TWICE A DAY  WITH MEALS   METOPROLOL SUCCINATE (TOPROL-XL) 25 MG 24 HR TABLET    Take 1 tablet (25 mg total) by mouth daily.   MULTIPLE VITAMIN (MULTIVITAMIN) CAPSULE    Take 1 capsule by mouth daily.     OMEPRAZOLE (PRILOSEC) 40 MG CAPSULE    TAKE 1 CAPSULE TWICE DAILY   PSYLLIUM (METAMUCIL) 58.6 % POWDER    Take 1 packet by mouth 3 (three) times daily.   RANITIDINE (ZANTAC) 300 MG CAPSULE    Take 300 mg by mouth every evening.   SACUBITRIL-VALSARTAN (ENTRESTO) 97-103 MG    Take 1 tablet by mouth 2 (two) times daily.   SIMVASTATIN (ZOCOR) 40 MG TABLET    TAKE 1 TABLET AT BEDTIME  Modified Medications   Modified Medication Previous Medication   SIMVASTATIN (ZOCOR) 40 MG TABLET simvastatin (ZOCOR) 40 MG tablet      TAKE 1 TABLET AT BEDTIME    Take 1 tablet (40 mg total) by mouth at bedtime.  Discontinued Medications   No medications on file   ----------------------------------------------------------------------------------------------------------------------  Follow-up: Return in about 3 months (around 10/14/2018) for procedure.   Procedure: L3-4 LESI with fluoroscopic guidance and without moderate sedation  NOTE: The risks, benefits, and expectations of the procedure have been discussed and explained to the patient who was understanding and in agreement with suggested treatment plan. No guarantees were made.  DESCRIPTION OF PROCEDURE: Lumbar epidural steroid injection with no IV Versed, EKG, blood pressure, pulse, and pulse oximetry monitoring. The procedure was performed with the patient in the prone position  under fluoroscopic guidance.  Sterile prep x3 was initiated and I then injected subcutaneous lidocaine to the overlying 3 4 site after its fluoroscopic identifictation.  Using strict aseptic technique, I then advanced an 18-gauge Tuohy epidural needle in the midline using interlaminar approach via loss-of-resistance to saline technique. There was negative aspiration for heme or  CSF.  I then confirmed position with both AP and Lateral fluoroscan.  2 cc of Isovue were injected and a  total of 5 mL of Preservative-Free normal saline mixed with 40 mg of Kenalog and 1cc Ropicaine 0.2 percent were injected incrementally via  the  epidurally placed needle. The needle was removed. The patient tolerated the injection well and was convalesced and discharged to home in stable condition. Should the patient have any post procedure difficulty they have been instructed on how to contact us for assistance.    Molli Barrows, MD

## 2018-07-26 ENCOUNTER — Emergency Department
Admission: EM | Admit: 2018-07-26 | Discharge: 2018-07-27 | Disposition: A | Discharge status: Home / Self Care | Payer: Medicare Other | Attending: Emergency Medicine | Admitting: Emergency Medicine

## 2018-07-26 ENCOUNTER — Emergency Department: Payer: Medicare Other

## 2018-07-26 ENCOUNTER — Encounter: Payer: Self-pay | Admitting: Emergency Medicine

## 2018-07-26 ENCOUNTER — Other Ambulatory Visit: Payer: Self-pay

## 2018-07-26 DIAGNOSIS — Z87891 Personal history of nicotine dependence: Secondary | ICD-10-CM | POA: Insufficient documentation

## 2018-07-26 DIAGNOSIS — Z79899 Other long term (current) drug therapy: Secondary | ICD-10-CM | POA: Insufficient documentation

## 2018-07-26 DIAGNOSIS — R079 Chest pain, unspecified: Secondary | ICD-10-CM

## 2018-07-26 DIAGNOSIS — Z8546 Personal history of malignant neoplasm of prostate: Secondary | ICD-10-CM | POA: Diagnosis not present

## 2018-07-26 DIAGNOSIS — I110 Hypertensive heart disease with heart failure: Secondary | ICD-10-CM | POA: Insufficient documentation

## 2018-07-26 DIAGNOSIS — E119 Type 2 diabetes mellitus without complications: Secondary | ICD-10-CM | POA: Diagnosis not present

## 2018-07-26 DIAGNOSIS — I2510 Atherosclerotic heart disease of native coronary artery without angina pectoris: Secondary | ICD-10-CM | POA: Diagnosis not present

## 2018-07-26 DIAGNOSIS — Z7982 Long term (current) use of aspirin: Secondary | ICD-10-CM | POA: Diagnosis not present

## 2018-07-26 DIAGNOSIS — I428 Other cardiomyopathies: Secondary | ICD-10-CM | POA: Diagnosis not present

## 2018-07-26 DIAGNOSIS — E785 Hyperlipidemia, unspecified: Secondary | ICD-10-CM | POA: Insufficient documentation

## 2018-07-26 DIAGNOSIS — R0789 Other chest pain: Secondary | ICD-10-CM | POA: Diagnosis not present

## 2018-07-26 DIAGNOSIS — Z950 Presence of cardiac pacemaker: Secondary | ICD-10-CM | POA: Diagnosis not present

## 2018-07-26 DIAGNOSIS — I5042 Chronic combined systolic (congestive) and diastolic (congestive) heart failure: Secondary | ICD-10-CM | POA: Insufficient documentation

## 2018-07-26 DIAGNOSIS — Z7984 Long term (current) use of oral hypoglycemic drugs: Secondary | ICD-10-CM | POA: Insufficient documentation

## 2018-07-26 LAB — TROPONIN I: Troponin I: <0.03 ng/mL (ref <0.03)

## 2018-07-26 LAB — BASIC METABOLIC PANEL
Anion gap: 12 (ref 5–15)
BUN: 16 mg/dL (ref 8–23)
CO2: 22 mmol/L (ref 22–32)
Calcium: 8.9 mg/dL (ref 8.9–10.3)
Chloride: 97 mmol/L — ABNORMAL LOW (ref 98–111)
Creatinine, Ser: 0.88 mg/dL (ref 0.61–1.24)
Glucose, Bld: 245 mg/dL — ABNORMAL HIGH (ref 70–99)
Potassium: 3.6 mmol/L (ref 3.5–5.1)
SODIUM: 131 mmol/L — AB (ref 135–145)

## 2018-07-26 LAB — CBC
HCT: 41.9 % (ref 40.0–52.0)
Hemoglobin: 14.2 g/dL (ref 13.0–18.0)
MCH: 26.9 pg (ref 26.0–34.0)
MCHC: 33.8 g/dL (ref 32.0–36.0)
MCV: 79.5 fL — ABNORMAL LOW (ref 80.0–100.0)
Platelets: 201 10*3/uL (ref 150–440)
RBC: 5.26 MIL/uL (ref 4.40–5.90)
RDW: 15.7 % — ABNORMAL HIGH (ref 11.5–14.5)
WBC: 6 10*3/uL (ref 3.8–10.6)

## 2018-07-26 NOTE — ED Triage Notes (Signed)
Patient with complaint of left side chest pain that stated about 45 minutes ago and states that it lasted about 30 minutes. Patient states that he felt dizzy. Denies nausea or shortness of breath. 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 Patient also states that at the time his blood pressure was high. He states that his blood pressure was 17/96.

## 2018-07-26 NOTE — ED Provider Notes (Signed)
[facility] Emergency Department Provider Note   ____________________________________________   I have reviewed the triage vital signs and the nursing notes.   HISTORY  Chief Complaint Chest Pain   History limited by: Not Limited   HPI Howard [name] is a 84 y.o. male who presents to the emergency department today because of concern for an episode of chest pain.  The patient was taking out the trash when he started developing chest pain.  Is located on the right side.  Described as pressure-like.  He denies any associated shortness of breath or diaphoresis.  Episode last about 30 minutes before getting better.  The patient however states that he had a similar episode about 1 week ago with chest pressure.  He denies any recent illness or fevers.   Per medical record review patient has a history of CAD, HLD, HTN.  Past Medical History:  Diagnosis Date  . Arthritis   . Bell palsy   . Cancer [facility])    prostate and skin  . Chronic combined systolic and diastolic CHF, NYHA class 1 ([place])    a. 07/2014 Echo: EF 35-40%, Gr 1 DD.  Marland Kitchen Complete heart block ([place])    a. 11/2010 s/p SJM 2210 Accent DC PPM, ser# [id number].  . Depression   . Diabetes mellitus without complication ([place])   . Fall 11-10-14  . GERD (gastroesophageal reflux disease)   . History of prostate cancer   . Hyperlipidemia   . Hypertension   . LBBB (left bundle branch block)   . Left-sided Bell's palsy   . Lung cancer ([place]) 2016  . NICM (nonischemic cardiomyopathy) ([place])    a. 07/2014 Echo: EF 35-40%, mid-apicalanteroseptal DK, Gr 1 DD, mild-mod dil LA.  . Non-obstructive CAD    a. 07/2014 Abnl MV;  b. 08/2014 Cath: LM nl, LAD 30p, RI 40p, LCX nl, OM1 40, RCA dominant 30p, 70d-->Med Rx.  Marland Kitchen Poor balance   . Presence of permanent cardiac pacemaker   . Sleep apnea    a. cpap  . Vertigo   . WPW (Wolff-Parkinson-White syndrome)    a. S/P RFCA 1991.    Patient Active Problem List   Diagnosis Date  Noted  . Malignant neoplasm of right lung ([place]) 04/18/2016  . CAD (coronary artery disease) 04/01/2016  . Adenocarcinoma ([place]) 04/01/2016  . Skin cyst 12/26/2015  . GERD (gastroesophageal reflux disease) 06/16/2015  . Abscess of back 06/06/2015  . Vertigo 03/27/2015  . Carpal tunnel syndrome 10/28/2014  . Status post cholecystectomy 09/29/2014  . Disease of digestive tract 09/29/2014  . Cardiomyopathy ([place]) 09/26/2014  . Type 2 diabetes mellitus without complications ([place])   . Sleep apnea   . Spinal stenosis, lumbar region, with neurogenic claudication 06/07/2014  . Lumbar stenosis with neurogenic claudication 06/07/2014  . Abnormal gait 08/20/2012  . H/O total knee replacement 08/20/2012  . Arthritis of knee, degenerative 08/20/2012  . Pacemaker-St.Jude 08/03/2012  . Cardiac conduction disorder 06/19/2012  . Acid reflux 06/18/2012  . 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 Triggering of digit 03/25/2012  . Hyperlipidemia 12/23/2011  . Essential hypertension 03/23/2011  . Atrioventricular block, complete ([place]) 03/23/2011  . Complete atrioventricular block ([place]) 03/23/2011    Past Surgical History:  Procedure Laterality Date  . APPLICATION OF WOUND VAC Left 06/07/2015   Procedure: APPLICATION OF WOUND VAC;  Surgeon: Robert Bellow, MD;  Location: [facility] ORS;  Service: General;  Laterality: Left;  left upper back  . BACK SURGERY  2011  . CARDIAC CATHETERIZATION  08/26/2014   Single vessel obstructive CAD  . CARPAL TUNNEL RELEASE  04-04-15   [facility]  . CATARACT EXTRACTION  07-31-11 and 09-18-11  . Catheter ablation  1991   for WPW  . cervical fusion    . CHOLECYSTECTOMY  09-07-14  . HAND SURGERY     right 1993; left 2005  . HERNIA REPAIR  1955  . INSERT / REPLACE / REMOVE PACEMAKER    . JOINT REPLACEMENT Left 2013   knee  . JOINT REPLACEMENT Right 2004   knee  . KNEE SURGERY     left knee 1991 and 1992; right knee 1995  . LEFT HEART CATHETERIZATION WITH CORONARY ANGIOGRAM N/A  08/26/2014   Procedure: LEFT HEART CATHETERIZATION WITH CORONARY ANGIOGRAM;  Surgeon: [name], MD;  Location: [facility] CATH LAB;  Service: Cardiovascular;  Laterality: N/A;  . LUMBAR LAMINECTOMY/DECOMPRESSION MICRODISCECTOMY N/A 06/07/2014   Procedure: LUMBAR FOUR TO FIVE LUMBAR LAMINECTOMY/DECOMPRESSION MICRODISCECTOMY 1 LEVEL;  Surgeon: [name], MD;  Location: [place] NEURO ORS;  Service: Neurosurgery;  Laterality: N/A;  . LUNG BIOPSY Right 2016   Dr [name]  . PACEMAKER INSERTION     PPM-- St Jude 11/30/10 by [name]  . PROSTATE SURGERY     cancer--1998, prostatectomy  . REPLACEMENT TOTAL KNEE     2004  . ruptured disc     1962 and 1998  . TRIGGER FINGER RELEASE  01-24-15  . WOUND DEBRIDEMENT Left 06/07/2015   Procedure: DEBRIDEMENT WOUND;  Surgeon: Robert Bellow, MD;  Location: [facility] ORS;  Service: General;  Laterality: Left;  left upper back    Prior to Admission medications   Medication Sig Start Date End Date Taking? Authorizing Provider  albuterol (PROVENTIL HFA;VENTOLIN HFA) 108 (90 Base) MCG/ACT inhaler Inhale 1-2 puffs into the lungs every 6 (six) hours as needed for wheezing or shortness of breath. 03/08/18   Coral Spikes, DO  aspirin 81 MG tablet Take 81 mg by mouth daily.      [provider]  Cholecalciferol (VITAMIN D3) 1000 UNITS CAPS Take 1,000 Units by mouth daily.     [provider]  hydrochlorothiazide (HYDRODIURIL) 25 MG tablet TAKE 1 TABLET DAILY 10/15/17   Chrismon, [name], PA  metFORMIN (GLUCOPHAGE) 500 MG tablet TAKE 1 TABLET TWICE A DAY  WITH MEALS 10/21/17   Chrismon, [name], PA  metoprolol succinate (TOPROL-XL) 25 MG 24 hr tablet Take 1 tablet (25 mg total) by mouth daily. 10/21/17   Deboraha Sprang, MD  Multiple Vitamin (MULTIVITAMIN) capsule Take 1 capsule by mouth daily.      [provider]  omeprazole (PRILOSEC) 40 MG capsule TAKE 1 CAPSULE TWICE DAILY Patient taking differently: TAKE 1 CAPSULE ONCE DAILY 04/07/17   Chrismon, [name]  E, PA  psyllium (METAMUCIL) 58.6 % powder Take 1 packet by mouth 3 (three) times daily.    [provider]  ranitidine (ZANTAC) 300 MG capsule Take 300 mg by mouth every evening.    [provider]  sacubitril-valsartan (ENTRESTO) 97-103 MG Take 1 tablet by mouth 2 (two) times daily. 12/31/17   [name], MD  simvastatin (ZOCOR) 40 MG tablet TAKE 1 TABLET AT BEDTIME 10/21/17   [name], MD  simvastatin (ZOCOR) 40 MG tablet TAKE 1 TABLET AT BEDTIME 07/16/18   [name], MD    Allergies Sulfa antibiotics and Sulfonamide derivatives  Family History  Problem Relation Age of Onset  . Heart  attack Mother   . Hyperlipidemia Mother   . CAD Unknown   . Prostate cancer Neg Hx     Social History Social History   Tobacco Use  . Smoking status: Former Smoker    Years: 4.00  . Smokeless tobacco: Never Used  . Tobacco comment: Quit 2011  Substance Use Topics  . Alcohol use: No    Frequency: Never  . Drug use: No    Review of Systems Constitutional: No fever/chills Eyes: No visual changes. ENT: No sore throat. Cardiovascular: Positive for chest pain. Respiratory: Denies shortness of breath. Gastrointestinal: No abdominal pain.  No nausea, no vomiting.  No diarrhea.   Genitourinary: Negative for dysuria. Musculoskeletal: Negative for back pain. Skin: Negative for rash. Neurological: Negative for headaches, focal weakness or numbness.  ____________________________________________   PHYSICAL EXAM:  VITAL SIGNS: ED Triage Vitals [07/26/18 2143]  Enc Vitals Group     BP (!) 162/75     Pulse Rate 86     Resp 20     Temp 98.3 F (36.8 C)     Temp Source Oral     SpO2 94 %     Weight 195 lb (88.5 kg)     Height 5\' 9"  (1.753 m)     Head Circumference      Peak Flow      Pain Score 0   Constitutional: Alert and oriented.  Eyes: Conjunctivae are normal.  ENT      Head: Normocephalic and atraumatic.      Nose: No  congestion/rhinnorhea.      Mouth/Throat: Mucous membranes are moist.      Neck: No stridor. Hematological/Lymphatic/Immunilogical: No cervical lymphadenopathy. Cardiovascular: Normal rate, regular rhythm.  No murmurs, rubs, or gallops.  Respiratory: Normal respiratory effort without tachypnea nor retractions. Breath sounds are clear and equal bilaterally. No wheezes/rales/rhonchi. Gastrointestinal: Soft and non tender. No rebound. No guarding.  Genitourinary: Deferred Musculoskeletal: Normal range of motion in all extremities. No lower extremity edema. Neurologic:  Normal speech and language. No gross focal neurologic deficits are appreciated.  Skin:  Skin is warm, dry and intact. No rash noted. Psychiatric: Mood and affect are normal. Speech and behavior are normal. Patient exhibits appropriate insight and judgment.  ____________________________________________    LABS (pertinent positives/negatives)  BMP na 131, k 3.6, glu 245, cr 0.88 CBC wbc 6.0, hgb 14.2, plt 201 Trop <0.03  ____________________________________________   EKG  I, Nance Pear, attending physician, personally viewed and interpreted this EKG  EKG Time: 2138 Rate: 88 Rhythm: atrial sensed v paced rhythm Axis: left axis Intervals: qtc 549 QRS: wide ST changes: no st elevation Impression: abnormal ekg   ____________________________________________    RADIOLOGY  CXR Airspace opacities vs mild pneumonia   ____________________________________________   PROCEDURES  Procedures  ____________________________________________   INITIAL IMPRESSION / ASSESSMENT AND PLAN / ED COURSE  Pertinent labs & imaging results that were available during my care of the patient were reviewed by me and considered in my medical decision making (see chart for details).   Patient presented to the emergency department today because of concerns for episode of chest pressure.  It was right-sided.  X-ray shows  airspace opacities mild pneumonia.  At this point patient is afebrile.  Not having any cough.  No shortness of breath.  No leukocytosis.  At this point I doubt pneumonia however did discuss possibility with patient.  The patient's initial troponin was negative.  However given history of coronary artery disease will plan on  repeating second troponin.  ________________________________________   FINAL CLINICAL IMPRESSION(S) / ED DIAGNOSES  Final diagnoses:  Chest pain, unspecified type     Note: This dictation was prepared with Dragon dictation. Any transcriptional errors that result from this process are unintentional     Nance Pear, MD 07/26/18 2340

## 2018-07-27 ENCOUNTER — Telehealth: Payer: Self-pay | Admitting: Cardiovascular Disease

## 2018-07-27 DIAGNOSIS — R079 Chest pain, unspecified: Secondary | ICD-10-CM | POA: Diagnosis not present

## 2018-07-27 LAB — TROPONIN I

## 2018-07-27 NOTE — ED Provider Notes (Signed)
-----------------------------------------   1:29 AM on 07/27/2018 -----------------------------------------  Repeat troponin remains negative.  Patient feels fine and ready for discharge.  Will discharge home with follow-up with cardiology.  Strict return precautions given.  Patient verbalizes understanding agrees with plan of care.   Paulette Blanch, MD 07/27/18 [phone]

## 2018-07-27 NOTE — Discharge Instructions (Addendum)
Continue all medications as directed by your doctor.  Return to the ER for recurrent or worsening symptoms, persistent vomiting, difficulty breathing or other concerns.

## 2018-07-27 NOTE — Telephone Encounter (Signed)
Lmov for patient  He was seen in ED on 07/26/18 for CP   Will try again at a later time

## 2018-07-29 NOTE — Telephone Encounter (Signed)
Lmov for patient  He was seen in ED on 07/26/18 for CP   Will try again at a later time

## 2018-08-04 NOTE — Telephone Encounter (Signed)
Patient scheduled 9/9 with Dr. [name]

## 2018-08-05 LAB — CUP PACEART REMOTE DEVICE CHECK
Battery Remaining Longevity: 8 mo
Battery Voltage: 2.69 V
Brady Statistic AS VS Percent: 1 %
Brady Statistic RA Percent Paced: 2.4 %
Brady Statistic RV Percent Paced: 99 %
Date Time Interrogation Session: 20190710060007
Implantable Lead Implant Date: 20111208
Implantable Lead Location: 753859
Implantable Pulse Generator Implant Date: 20111208
Lead Channel Impedance Value: 430 Ohm
Lead Channel Pacing Threshold Amplitude: 0.75 V
Lead Channel Pacing Threshold Pulse Width: 0.4 ms
Lead Channel Pacing Threshold Pulse Width: 0.8 ms
Lead Channel Setting Pacing Amplitude: 2.5 V
Lead Channel Setting Sensing Sensitivity: 4 mV
MDC IDC LEAD IMPLANT DT: 20111208
MDC IDC LEAD LOCATION: 753860
MDC IDC MSMT BATTERY REMAINING PERCENTAGE: 8 %
MDC IDC MSMT LEADCHNL RA SENSING INTR AMPL: 2.7 mV
MDC IDC MSMT LEADCHNL RV IMPEDANCE VALUE: 340 Ohm
MDC IDC MSMT LEADCHNL RV PACING THRESHOLD AMPLITUDE: 0.75 V
MDC IDC MSMT LEADCHNL RV SENSING INTR AMPL: 12 mV
MDC IDC SET LEADCHNL RA PACING AMPLITUDE: 2 V
MDC IDC SET LEADCHNL RV PACING PULSEWIDTH: 0.8 ms
MDC IDC STAT BRADY AP VP PERCENT: 2.8 %
MDC IDC STAT BRADY AP VS PERCENT: 1 %
MDC IDC STAT BRADY AS VP PERCENT: 97 %
Pulse Gen Model: 2210
Pulse Gen Serial Number: 7196739

## 2018-08-14 NOTE — Progress Notes (Signed)
Cardiology Office Note  Date:  08/17/2018   ID:  Howard [name], DOB [birth year], MRN [id number]  PCP:  Howard Common, PA   Chief Complaint  Patient presents with  . Other    ED follow up seen on 07/26/18 for CP. Patient c/o high blood pressure. Meds reviewed verbally with patient.     HPI:  Howard [name] is a 84 year old-year-old gentleman with a hx of  DM II complete heart block, status post pacemaker in 2011,  nonischemic cardiomyopathy,  ejection fraction 30-35%   coronary artery disease, 70% distal RCA disease, 30 and 40% lesions in his ramus, LAD  admission to the hospital 09/05/2014  with acute cholecystitis, ileus.  Cardiac catheterization at that time for positive stress test . Lung CA  He presents for follow-up of his CAD  Seen in the emergency room 07/26/2018 for chest pain Episode lasted 30 minutes Chest pain on the right side, detail somewhat unclear Similar episode one week prior Hospital records reviewed with the patient in detail Cardiac enzymes negative  Echocardiogram June 2019 EF 35-40% Previously was 30-35%  In follow-up today he reports having more difficulty walking secondary to chronic back pain hip pain He is not participating in [facility] anymore as he is unable to make it there Feels like he is going to give out He has seen pain clinic in the past received injections  BP at home 150s Typically running high  Review of records with him today shows no recent stress test or catheterization in 10 years There was cardiac catheterization in the [facility] system 1990  Hx of back surgery x 6 Legs lock, particularly on the left side  Previous CT chest 03/2016 Decreasing size of pathology-proven adenocarcinoma of right upper lobe, with associated radiation treatment effects of the lung.  EKG personally reviewed by myself on todays visit shows venhometricularly paced rhythm rate 71 bpm  Other past medical history reviewed He was in the hospital November  2016 for abscess on his back, had surgery Nodules found in the right lung, biopsy documenting adenocarcinoma 1.3 cm, treated with radiation  Lab work reviewed with him showing total cholesterol 133, hemoglobin A1c 7.1  Previous chest CT scan showing moderate coronary calcifications in the LAD and diagonal branches, ramus Also with at least mild diffuse aortic atherosclerosis, mild carotid disease  vertigo February 2016  In early September 2015 he was seen in the [place] office with symptoms of chest pain and shortness of breath,  he had a Abnormal Myoview  cardiac catheterization showing details above Echocardiogram showing ejection fraction 35-40%  After discharge she continued to have similar symptoms of epigastric discomfort radiating through to his back Admitted to the hospital for cholecystectomy  PMH:   has a past medical history of Arthritis, Bell palsy, Cancer ([place]), Chronic combined systolic and diastolic CHF, NYHA class 1 ([place]), Complete heart block ([place]), Depression, Diabetes mellitus without complication ([place]), Fall (11-10-14), GERD (gastroesophageal reflux disease), History of prostate cancer, Hyperlipidemia, Hypertension, LBBB (left bundle branch block), Left-sided Bell's palsy, Lung cancer ([place]) (2016), NICM (nonischemic cardiomyopathy) ([place]), Non-obstructive CAD, Poor balance, Presence of permanent cardiac pacemaker, Sleep apnea, Vertigo, and WPW (Wolff-Parkinson-White syndrome).  PSH:    Past Surgical History:  Procedure Laterality Date  . APPLICATION OF WOUND VAC Left 06/07/2015   Procedure: APPLICATION OF WOUND VAC;  Surgeon: Robert Bellow, MD;  Location: [facility] ORS;  Service: General;  Laterality: Left;  left upper back  . BACK SURGERY     2011  .  CARDIAC CATHETERIZATION  08/26/2014   Single vessel obstructive CAD  . CARPAL TUNNEL RELEASE  04-04-15   [facility]  . CATARACT EXTRACTION  07-31-11 and 09-18-11  . Catheter ablation  1991   for WPW  . cervical fusion     . CHOLECYSTECTOMY  09-07-14  . HAND SURGERY     right 1993; left 2005  . HERNIA REPAIR  1955  . INSERT / REPLACE / REMOVE PACEMAKER    . JOINT REPLACEMENT Left 2013   knee  . JOINT REPLACEMENT Right 2004   knee  . KNEE SURGERY     left knee 1991 and 1992; right knee 1995  . LEFT HEART CATHETERIZATION WITH CORONARY ANGIOGRAM N/A 08/26/2014   Procedure: LEFT HEART CATHETERIZATION WITH CORONARY ANGIOGRAM;  Surgeon: [name], MD;  Location: [facility];  Service: Cardiovascular;  Laterality: N/A;  . LUMBAR LAMINECTOMY/DECOMPRESSION MICRODISCECTOMY N/A 06/07/2014   Procedure: LUMBAR FOUR TO FIVE LUMBAR LAMINECTOMY/DECOMPRESSION MICRODISCECTOMY 1 LEVEL;  Surgeon: [name], MD;  Location: [place] NEURO ORS;  Service: Neurosurgery;  Laterality: N/A;  . LUNG BIOPSY Right 2016   Dr [name]  . PACEMAKER INSERTION     PPM-- St Jude 11/30/10 by [name]  . PROSTATE SURGERY     cancer--1998, prostatectomy  . REPLACEMENT TOTAL KNEE     2004  . ruptured disc     1962 and 1998  . TRIGGER FINGER RELEASE  01-24-15  . WOUND DEBRIDEMENT Left 06/07/2015   Procedure: DEBRIDEMENT WOUND;  Surgeon: Robert Bellow, MD;  Location: [facility] ORS;  Service: General;  Laterality: Left;  left upper back    Current Outpatient Medications  Medication Sig Dispense Refill  . albuterol (PROVENTIL HFA;VENTOLIN HFA) 108 (90 Base) MCG/ACT inhaler Inhale 1-2 puffs into the lungs every 6 (six) hours as needed for wheezing or shortness of breath. 1 Inhaler 0  . aspirin 81 MG tablet Take 81 mg by mouth daily.      . Cholecalciferol (VITAMIN D3) 1000 UNITS CAPS Take 1,000 Units by mouth daily.     . hydrochlorothiazide (HYDRODIURIL) 25 MG tablet TAKE 1 TABLET DAILY 90 tablet 3  . metFORMIN (GLUCOPHAGE) 500 MG tablet TAKE 1 TABLET TWICE A DAY  WITH MEALS 180 tablet 3  . metoprolol succinate (TOPROL-XL) 25 MG 24 hr tablet Take 1 tablet (25 mg total) by mouth daily. 90 tablet 3  . Multiple Vitamin (MULTIVITAMIN) capsule Take 1 capsule by  mouth daily.      Marland Kitchen omeprazole (PRILOSEC) 40 MG capsule TAKE 1 CAPSULE TWICE DAILY (Patient taking differently: TAKE 1 CAPSULE ONCE DAILY) 180 capsule 3  . psyllium (METAMUCIL) 58.6 % powder Take 1 packet by mouth 3 (three) times daily.    . ranitidine (ZANTAC) 300 MG capsule Take 300 mg by mouth every evening.    . sacubitril-valsartan (ENTRESTO) 97-103 MG Take 1 tablet by mouth 2 (two) times daily. 180 tablet 3  . simvastatin (ZOCOR) 40 MG tablet TAKE 1 TABLET AT BEDTIME 90 tablet 3  . spironolactone (ALDACTONE) 50 MG tablet Take 1 tablet (50 mg total) by mouth daily. 30 tablet 1   No current facility-administered medications for this visit.      Allergies:   Sulfa antibiotics and Sulfonamide derivatives   Social History:  The patient  reports that he has quit smoking. He quit after 4.00 years of use. He has never used smokeless tobacco. He reports that he does not drink alcohol or use drugs.   Family History:  family history includes CAD in his unknown relative; Heart attack in his mother; Hyperlipidemia in his mother.    Review of Systems: Review of Systems  Constitutional: Negative.   Respiratory: Negative.   Cardiovascular: Negative.   Gastrointestinal: Negative.   Musculoskeletal: Positive for back pain and joint pain.  Neurological: Negative.   Psychiatric/Behavioral: Negative.   All other systems reviewed and are negative.    PHYSICAL EXAM: VS:  BP (!) 144/66 (BP Location: Left Arm, Patient Position: Sitting, Cuff Size: Normal)   Pulse 71   Ht 5' 9.5" (1.765 m)   Wt 197 lb (89.4 kg)   BMI 28.67 kg/m  , BMI Body mass index is 28.67 kg/m. Constitutional:  oriented to person, place, and time. No distress.  HENT:  Head: Normocephalic and atraumatic.  Eyes:  no discharge. No scleral icterus.  Neck: Normal range of motion. Neck supple. No JVD present.  Cardiovascular: Normal rate, regular rhythm, normal heart sounds and intact distal pulses. Exam reveals no gallop and  no friction rub. No edema No murmur heard. Pulmonary/Chest: Effort normaland breath sounds normal. No stridor. No respiratory distress.  no wheezes.  no rales.  no tenderness.  Abdominal: Soft.  no distension.  no tenderness.  Musculoskeletal: Normal range of motion.  no  tenderness or deformity.  Neurological:  normal muscle tone. Coordination normal. No atrophy Skin: Skin is warm and dry. No rash noted. not diaphoretic.  Psychiatric:  normal mood and affect. behavior is normal. Thought content normal.     Recent Labs: 05/05/2018: ALT 13; TSH 1.650 07/26/2018: BUN 16; Creatinine, Ser 0.88; Hemoglobin 14.2; Platelets 201; Potassium 3.6; Sodium 131    Lipid Panel Lab Results  Component Value Date   CHOL 170 05/05/2018   HDL 58 05/05/2018   LDLCALC 87 05/05/2018   TRIG 124 05/05/2018      Wt Readings from Last 3 Encounters:  08/17/18 197 lb (89.4 kg)  07/26/18 195 lb (88.5 kg)  07/14/18 195 lb (88.5 kg)       ASSESSMENT AND PLAN:  Coronary artery disease involving native coronary artery of native heart without angina pectoris - Plan: EKG 12-Lead Some chest pain stuttering Seen in the emergency room for symptoms beginning of August Discussed with him in detail, no recent workup Concerning for ischemia We will order a pharmacologic Myoview as he is unable to treadmill  Complete heart block ([place]) - Plan: EKG 12-Lead S/p pacemaker Followed by EP Stable, paced rhythm Denies any significant shortness of breath. Ejection fraction low 35-40% RV pacing  Essential hypertension - Plan: EKG 12-Lead Blood pressure elevated even on his numbers at home We will Start spironolactone 25 mg daily for 2 weeks he will monitor blood pressure numbers  if blood pressure continues to run high we will increase dose up to 50 mg daily  Mixed hyperlipidemia Recommended he watch his carbohydrates, needs small weight loss  On simvastatin LDL above goal Ideally we should add Zetia 10 mg  daily  Nonischemic cardiomyopathy (H[facility]) Continue beta blocker, Entresto  spironolactone added today as above  Chronic back pain Long discussion with him, we have ordered physical therapy locally He is having difficulty walking  Pacemaker-St.Jude Followed by EP  Malignant neoplasm of middle lobe of right lung [facility]) Previous CT scan March 2017  Stable   Total encounter time more than 45 minutes  Greater than 50% was spent in counseling and coordination of care with the patient   Disposition:   F/U  12 months  Orders Placed This Encounter  Procedures  . NM Myocar Multi W/Spect W/Wall Motion / EF  . Ambulatory referral to Physical Therapy  . EKG 12-Lead     Signed, Esmond Plants, M.D., Ph.D. 08/17/2018  [place], [place]

## 2018-08-17 ENCOUNTER — Encounter

## 2018-08-17 ENCOUNTER — Ambulatory Visit (INDEPENDENT_AMBULATORY_CARE_PROVIDER_SITE_OTHER): Payer: Medicare Other | Admitting: Cardiovascular Disease

## 2018-08-17 ENCOUNTER — Encounter: Payer: Self-pay | Admitting: Cardiovascular Disease

## 2018-08-17 VITALS — BP 144/66 | HR 71 | Ht 69.5 in | Wt 197.0 lb

## 2018-08-17 DIAGNOSIS — I10 Essential (primary) hypertension: Secondary | ICD-10-CM | POA: Diagnosis not present

## 2018-08-17 DIAGNOSIS — E782 Mixed hyperlipidemia: Secondary | ICD-10-CM | POA: Diagnosis not present

## 2018-08-17 DIAGNOSIS — R079 Chest pain, unspecified: Secondary | ICD-10-CM | POA: Diagnosis not present

## 2018-08-17 DIAGNOSIS — E119 Type 2 diabetes mellitus without complications: Secondary | ICD-10-CM | POA: Diagnosis not present

## 2018-08-17 DIAGNOSIS — I25118 Atherosclerotic heart disease of native coronary artery with other forms of angina pectoris: Secondary | ICD-10-CM | POA: Diagnosis not present

## 2018-08-17 DIAGNOSIS — I442 Atrioventricular block, complete: Secondary | ICD-10-CM | POA: Diagnosis not present

## 2018-08-17 DIAGNOSIS — I420 Dilated cardiomyopathy: Secondary | ICD-10-CM | POA: Diagnosis not present

## 2018-08-17 DIAGNOSIS — Z950 Presence of cardiac pacemaker: Secondary | ICD-10-CM

## 2018-08-17 DIAGNOSIS — R262 Difficulty in walking, not elsewhere classified: Secondary | ICD-10-CM | POA: Diagnosis not present

## 2018-08-17 DIAGNOSIS — M48062 Spinal stenosis, lumbar region with neurogenic claudication: Secondary | ICD-10-CM

## 2018-08-17 MED ORDER — SPIRONOLACTONE 50 MG PO TABS: 50.0000 mg | ORAL_TABLET | Freq: Every day | ORAL | 1 refills | Status: DC

## 2018-08-17 NOTE — Patient Instructions (Addendum)
We will order PT at the [facility] on [address], Difficulty walking,   Medication Instructions:   Please take 1/2 pill of the spironolactone once a day for 2 weeks If blood pressure is running high, Increase up to a full pill once a day  Labwork:  No new labs needed  Testing/Procedures:  We will order a lexiscan myoview for CAD and chest pain [place]  Your caregiver has ordered a Stress Test with nuclear imaging. The purpose of this test is to evaluate the blood supply to your heart muscle. This procedure is referred to as a "Non-Invasive Stress Test." This is because other than having an IV started in your vein, nothing is inserted or "invades" your body. Cardiac stress tests are done to find areas of poor blood flow to the heart by determining the extent of coronary artery disease (CAD). Some patients exercise on a treadmill, which naturally increases the blood flow to your heart, while others who are  unable to walk on a treadmill due to physical limitations have a pharmacologic/chemical stress agent called Lexiscan . This medicine will mimic walking on a treadmill by temporarily increasing your coronary blood flow.   Please note: these test may take anywhere between 2-4 hours to complete  PLEASE REPORT TO [place] AT THE FIRST DESK WILL DIRECT YOU WHERE TO GO  Date of Procedure:_____________________________________  Arrival Time for Procedure:______________________________  Instructions regarding medication:   _XX___ : Hold diabetes medication morning of procedure Metformin (Glucophage)   _XX___:  Hold Metoprolol the night before procedure and morning of procedure   PLEASE NOTIFY THE OFFICE AT LEAST 24 HOURS IN ADVANCE IF YOU ARE UNABLE TO KEEP YOUR APPOINTMENT.  [phone] AND  PLEASE NOTIFY NUCLEAR MEDICINE AT [facility] AT LEAST 24 HOURS IN ADVANCE IF YOU ARE UNABLE TO KEEP YOUR APPOINTMENT. [phone]  How to prepare for your Myoview  test:  1. Do not eat or drink after midnight 2. No caffeine for 24 hours prior to test 3. No smoking 24 hours prior to test. 4. Your medication may be taken with water.  If your doctor stopped a medication because of this test, do not take that medication. 5. Ladies, please do not wear dresses.  Skirts or pants are appropriate. Please wear a short sleeve shirt. 6. No perfume, cologne or lotion. 7. Wear comfortable walking shoes. No heels!    Follow-Up: It was a pleasure seeing you in the office today. Please call us if you have new issues that need to be addressed before your next appt.  [phone]  Your physician wants you to follow-up in: 12 months.  You will receive a reminder letter in the mail two months in advance. If you don't receive a letter, please call our office to schedule the follow-up appointment.  If you need a refill on your cardiac medications before your next appointment, please call your pharmacy.  For educational health videos Log in to : [email or web address] Or : [email or web address], password : [email or web address]

## 2018-08-19 DIAGNOSIS — G4733 Obstructive sleep apnea (adult) (pediatric): Secondary | ICD-10-CM | POA: Diagnosis not present

## 2018-08-20 ENCOUNTER — Encounter
Admission: RE | Admit: 2018-08-20 | Discharge: 2018-08-20 | Disposition: A | Discharge status: Home / Self Care | Payer: Medicare Other | Source: Ambulatory Visit | Attending: Cardiovascular Disease | Admitting: Cardiovascular Disease

## 2018-08-20 DIAGNOSIS — R079 Chest pain, unspecified: Secondary | ICD-10-CM | POA: Insufficient documentation

## 2018-08-20 LAB — NM MYOCAR MULTI W/SPECT W/WALL MOTION / EF
CHL CUP NUCLEAR SRS: 26
CHL CUP NUCLEAR SSS: 23
CSEPPHR: 96 {beats}/min
LV dias vol: 134 mL (ref 62–150)
LVSYSVOL: 65 mL
Percent HR: 71 %
Rest HR: 77 {beats}/min
SDS: 0
TID: 1.24

## 2018-08-20 MED ORDER — REGADENOSON 0.4 MG/5ML IV SOLN
0.4000 mg | Freq: Once | INTRAVENOUS | Status: AC
  Administered 2018-08-20: 0.4 mg via INTRAVENOUS

## 2018-08-20 MED ORDER — TECHNETIUM TC 99M TETROFOSMIN IV KIT
31.3580 | PACK | Freq: Once | INTRAVENOUS | Status: AC | PRN
  Administered 2018-08-20: 31.358 via INTRAVENOUS

## 2018-08-20 MED ORDER — TECHNETIUM TC 99M TETROFOSMIN IV KIT
13.8140 | PACK | Freq: Once | INTRAVENOUS | Status: AC | PRN
  Administered 2018-08-20: 13.814 via INTRAVENOUS

## 2018-08-25 ENCOUNTER — Ambulatory Visit (INDEPENDENT_AMBULATORY_CARE_PROVIDER_SITE_OTHER): Payer: Medicare Other | Admitting: Family Medicine

## 2018-08-25 ENCOUNTER — Encounter: Payer: Self-pay | Admitting: Family Medicine

## 2018-08-25 VITALS — BP 134/60 | HR 62 | Temp 97.8°F | Wt 198.0 lb

## 2018-08-25 DIAGNOSIS — I420 Dilated cardiomyopathy: Secondary | ICD-10-CM

## 2018-08-25 DIAGNOSIS — E782 Mixed hyperlipidemia: Secondary | ICD-10-CM | POA: Diagnosis not present

## 2018-08-25 DIAGNOSIS — I25118 Atherosclerotic heart disease of native coronary artery with other forms of angina pectoris: Secondary | ICD-10-CM | POA: Diagnosis not present

## 2018-08-25 DIAGNOSIS — I10 Essential (primary) hypertension: Secondary | ICD-10-CM

## 2018-08-25 DIAGNOSIS — M48062 Spinal stenosis, lumbar region with neurogenic claudication: Secondary | ICD-10-CM | POA: Diagnosis not present

## 2018-08-25 DIAGNOSIS — E119 Type 2 diabetes mellitus without complications: Secondary | ICD-10-CM | POA: Diagnosis not present

## 2018-08-25 LAB — POCT GLYCOSYLATED HEMOGLOBIN (HGB A1C): HEMOGLOBIN A1C: 7.6 % — AB (ref 4.0–5.6)

## 2018-08-25 NOTE — Progress Notes (Signed)
Patient: Howard [name] Male    DOB: 29-Oct-1931   84 y.o.   MRN: [id number] Visit Date: 08/25/2018  Today's Provider: [name], PA   Chief Complaint  Patient presents with  . Hypertension  . Hyperlipidemia  . Diabetes   Subjective:    Diabetes  He presents for his follow-up diabetic visit. He has type 2 diabetes mellitus. His disease course has been stable. There are no hypoglycemic associated symptoms. Pertinent negatives for diabetes include no blurred vision, no chest pain, no fatigue, no foot paresthesias, no foot ulcerations, no polydipsia, no polyphagia, no polyuria, no visual change, no weakness and no weight loss. There are no hypoglycemic complications. There are no diabetic complications. Risk factors for coronary artery disease include dyslipidemia, diabetes mellitus, male sex and hypertension. Current diabetic treatment includes oral agent (monotherapy). He is compliant with treatment all of the time. His weight is stable. He is following a generally healthy diet. Home blood sugar record trend: Fasting blood sugars around 140's.  An ACE inhibitor/angiotensin II receptor blocker is not being taken. He does not see a podiatrist.Eye exam is current.  Hypertension  This is a chronic (Pt states the Cardiologist started him on Spironolactone 50mg  1/2 daily.) problem. The problem is controlled. Pertinent negatives include no anxiety, blurred vision, chest pain, malaise/fatigue, palpitations, peripheral edema or shortness of breath. There are no associated agents to hypertension. There are no compliance problems.   Hyperlipidemia  This is a chronic problem. The problem is controlled. Recent lipid tests were reviewed and are normal. Exacerbating diseases include diabetes. Pertinent negatives include no chest pain or shortness of breath. Current antihyperlipidemic treatment includes statins. There are no compliance problems.    Lab Results  Component Value Date   CHOL 170  05/05/2018   CHOL 135 09/30/2017   CHOL 144 02/18/2017   Lab Results  Component Value Date   HDL 58 05/05/2018   HDL 48 09/30/2017   HDL 46 02/18/2017   Lab Results  Component Value Date   LDLCALC 87 05/05/2018   LDLCALC 63 09/30/2017   LDLCALC 70 02/18/2017   Lab Results  Component Value Date   TRIG 124 05/05/2018   TRIG 154 (H) 09/30/2017   TRIG 138 02/18/2017   Lab Results  Component Value Date   CHOLHDL 2.9 05/05/2018   CHOLHDL 2.8 09/30/2017   CHOLHDL 3.1 02/18/2017   No results found for: LDLDIRECT BP Readings from Last 3 Encounters:  08/25/18 134/60  08/17/18 (!) 144/66  07/27/18 (!) 151/74   Lab Results  Component Value Date   HGBA1C 7.6 (H) 05/05/2018   Wt Readings from Last 3 Encounters:  08/25/18 198 lb (89.8 kg)  08/17/18 197 lb (89.4 kg)  07/26/18 195 lb (88.5 kg)      Past Medical History:  Diagnosis Date  . Arthritis   . Bell palsy   . Cancer [facility])    prostate and skin  . Chronic combined systolic and diastolic CHF, NYHA class 1 ([place])    a. 07/2014 Echo: EF 35-40%, Gr 1 DD.  Marland Kitchen Complete heart block ([place])    a. 11/2010 s/p SJM 2210 [employer] DC PPM, ser# [id number].  . Depression   . Diabetes mellitus without complication ([place])   . Fall 11-10-14  . GERD (gastroesophageal reflux disease)   . History of prostate cancer   . Hyperlipidemia   . Hypertension   . LBBB (left bundle branch block)   . Left-sided Bell's palsy   .  Lung cancer ([place]) 2016  . NICM (nonischemic cardiomyopathy) ([place])    a. 07/2014 Echo: EF 35-40%, mid-apicalanteroseptal DK, Gr 1 DD, mild-mod dil LA.  . Non-obstructive CAD    a. 07/2014 Abnl MV;  b. 08/2014 Cath: LM nl, LAD 30p, RI 40p, LCX nl, OM1 40, RCA dominant 30p, 70d-->Med Rx.  Marland Kitchen Poor balance   . Presence of permanent cardiac pacemaker   . Sleep apnea    a. cpap  . Vertigo   . WPW (Wolff-Parkinson-White syndrome)    a. S/P RFCA 1991.   Past Surgical History:  Procedure Laterality Date  . APPLICATION OF WOUND VAC  Left 06/07/2015   Procedure: APPLICATION OF WOUND VAC;  Surgeon: Robert Bellow, MD;  Location: [facility] ORS;  Service: General;  Laterality: Left;  left upper back  . BACK SURGERY     2011  . CARDIAC CATHETERIZATION  08/26/2014   Single vessel obstructive CAD  . CARPAL TUNNEL RELEASE  04-04-15   [facility]  . CATARACT EXTRACTION  07-31-11 and 09-18-11  . Catheter ablation  1991   for WPW  . cervical fusion    . CHOLECYSTECTOMY  09-07-14  . HAND SURGERY     right 1993; left 2005  . HERNIA REPAIR  1955  . INSERT / REPLACE / REMOVE PACEMAKER    . JOINT REPLACEMENT Left 2013   knee  . JOINT REPLACEMENT Right 2004   knee  . KNEE SURGERY     left knee 1991 and 1992; right knee 1995  . LEFT HEART CATHETERIZATION WITH CORONARY ANGIOGRAM N/A 08/26/2014   Procedure: LEFT HEART CATHETERIZATION WITH CORONARY ANGIOGRAM;  Surgeon: [name], MD;  Location: [facility] CATH LAB;  Service: Cardiovascular;  Laterality: N/A;  . LUMBAR LAMINECTOMY/DECOMPRESSION MICRODISCECTOMY N/A 06/07/2014   Procedure: LUMBAR FOUR TO FIVE LUMBAR LAMINECTOMY/DECOMPRESSION MICRODISCECTOMY 1 LEVEL;  Surgeon: [name], MD;  Location: [facility] NEURO ORS;  Service: Neurosurgery;  Laterality: N/A;  . LUNG BIOPSY Right 2016   Dr [name]  . PACEMAKER INSERTION     PPM-- [employer] Jude 11/30/10 by [name]  . PROSTATE SURGERY     cancer--1998, prostatectomy  . REPLACEMENT TOTAL KNEE     2004  . ruptured disc     1962 and 1998  . TRIGGER FINGER RELEASE  01-24-15  . WOUND DEBRIDEMENT Left 06/07/2015   Procedure: DEBRIDEMENT WOUND;  Surgeon: Robert Bellow, MD;  Location: [facility] ORS;  Service: General;  Laterality: Left;  left upper back   Family History  Problem Relation Age of Onset  . Heart attack Mother   . Hyperlipidemia Mother   . CAD Unknown   . Prostate cancer Neg Hx    Allergies  Allergen Reactions  . Sulfa Antibiotics Rash  . Sulfonamide Derivatives Rash    Current Outpatient Medications:  .  albuterol (PROVENTIL HFA;VENTOLIN HFA) 108 (90  Base) MCG/ACT inhaler, Inhale 1-2 puffs into the lungs every 6 (six) hours as needed for wheezing or shortness of breath., Disp: 1 Inhaler, Rfl: 0 .  aspirin 81 MG tablet, Take 81 mg by mouth daily.  , Disp: , Rfl:  .  Cholecalciferol (VITAMIN D3) 1000 UNITS CAPS, Take 1,000 Units by mouth daily. , Disp: , Rfl:  .  hydrochlorothiazide (HYDRODIURIL) 25 MG tablet, TAKE 1 TABLET DAILY, Disp: 90 tablet, Rfl: 3 .  metFORMIN (GLUCOPHAGE) 500 MG tablet, TAKE 1 TABLET TWICE A DAY  WITH MEALS, Disp: 180 tablet, Rfl: 3 .  metoprolol succinate (TOPROL-XL) 25 MG 24 hr  tablet, Take 1 tablet (25 mg total) by mouth daily., Disp: 90 tablet, Rfl: 3 .  Multiple Vitamin (MULTIVITAMIN) capsule, Take 1 capsule by mouth daily.  , Disp: , Rfl:  .  omeprazole (PRILOSEC) 40 MG capsule, TAKE 1 CAPSULE TWICE DAILY (Patient taking differently: TAKE 1 CAPSULE ONCE DAILY), Disp: 180 capsule, Rfl: 3 .  psyllium (METAMUCIL) 58.6 % powder, Take 1 packet by mouth 3 (three) times daily., Disp: , Rfl:  .  ranitidine (ZANTAC) 300 MG capsule, Take 300 mg by mouth every evening., Disp: , Rfl:  .  sacubitril-valsartan (ENTRESTO) 97-103 MG, Take 1 tablet by mouth 2 (two) times daily., Disp: 180 tablet, Rfl: 3 .  simvastatin (ZOCOR) 40 MG tablet, TAKE 1 TABLET AT BEDTIME, Disp: 90 tablet, Rfl: 3 .  spironolactone (ALDACTONE) 50 MG tablet, Take 1 tablet (50 mg total) by mouth daily., Disp: 30 tablet, Rfl: 1  Review of Systems  Constitutional: Negative for fatigue, malaise/fatigue and weight loss.  Eyes: Negative for blurred vision.  Respiratory: Negative for shortness of breath.   Cardiovascular: Negative for chest pain and palpitations.  Endocrine: Negative for polydipsia, polyphagia and polyuria.  Neurological: Negative for weakness.   Social History   Tobacco Use  . Smoking status: Former Smoker    Years: 4.00  . Smokeless tobacco: Never Used  . Tobacco comment: Quit 2011  Substance Use Topics  . Alcohol use: No     Frequency: Never   Objective:   BP 134/60 (BP Location: Right Arm, Patient Position: Sitting, Cuff Size: Normal)   Pulse 62   Temp 97.8 F (36.6 C) (Oral)   Wt 198 lb (89.8 kg)   SpO2 95%   BMI 28.82 kg/m  Vitals:   08/25/18 1013  BP: 134/60  Pulse: 62  Temp: 97.8 F (36.6 C)  TempSrc: Oral  SpO2: 95%  Weight: 198 lb (89.8 kg)   Physical Exam  Constitutional: He is oriented to person, place, and time. He appears well-developed and well-nourished. No distress.  HENT:  Head: Normocephalic and atraumatic.  Right Ear: Hearing normal.  Left Ear: Hearing normal.  Nose: Nose normal.  Eyes: Conjunctivae and lids are normal. Right eye exhibits no discharge. Left eye exhibits no discharge. No scleral icterus.  Neck: Neck supple. No JVD present.  Cardiovascular: Normal rate and regular rhythm.  Pacemaker in left upper chest.  Pulmonary/Chest: Effort normal and breath sounds normal. No respiratory distress.  Abdominal: Soft. Bowel sounds are normal.  Neurological: He is alert and oriented to person, place, and time.  Skin: Skin is intact. No lesion and no rash noted.  Psychiatric: He has a normal mood and affect. His speech is normal and behavior is normal. Thought content normal.      Assessment & Plan:     1. Type 2 diabetes mellitus without complication, without long-term current use of insulin (HCC) Feels FBS stable at 130-150 at home. Hgb A1C stable at 7.6% (same as 3 months ago while taking Metformin 500 mg BID and following diet plan. Taking ARB (in the [place]) and statin (Simvastatin). Recheck routine labs and encouraged to see ophthalmologist annually. Follow up pending reports. - POCT glycosylated hemoglobin (Hb A1C) - CBC with Differential/Platelet - Comprehensive metabolic panel - Lipid panel  2. Essential hypertension Had BP elevations during spinal injections and cardiologist added Spironolactone 50 mg 1/2 tablet qd. BP came down from 160/64 to 134/60 today. Still  taking Metoprolol Succinate 25 mg qd with HCTZ 25 mg qd. No recent chest  pains or significant palpitations. Recheck CBC, CMP and Lipid Panel. Follow up pending reports. - CBC with Differential/Platelet - Comprehensive metabolic panel - Lipid panel  3. Mixed hyperlipidemia Tolerating Simvastatin 40 mg qd without side effects. Continue low fat diet and proceed with PT. Recheck labs and follow up pending reports. - Comprehensive metabolic panel - Lipid panel  4. Spinal stenosis, lumbar region, with neurogenic claudication Followed by Dr. Andree Elk (pain management) for spinal injections and scheduled for PT 08-27-18. Continues to have neurogenic claudication when walking to the curb and back at home.  5. Dilated cardiomyopathy ([place]) History of complete heart block with EF 30-35% and pacemaker placement in 2011. Treated with Entresto and Metoprolol for CHF. EF up to 35-40% in June 2019 per cardiologist (Dr. [name]).       [name], PA  [facility]

## 2018-08-26 LAB — CBC WITH DIFFERENTIAL/PLATELET
BASOS: 1 %
Basophils Absolute: 0.1 10*3/uL (ref 0.0–0.2)
EOS (ABSOLUTE): 0.5 10*3/uL — AB (ref 0.0–0.4)
Eos: 8 %
Hematocrit: 45.6 % (ref 37.5–51.0)
Hemoglobin: 14.5 g/dL (ref 13.0–17.7)
Immature Grans (Abs): 0 10*3/uL (ref 0.0–0.1)
Immature Granulocytes: 0 %
LYMPHS ABS: 1.9 10*3/uL (ref 0.7–3.1)
Lymphs: 33 %
MCH: 25.8 pg — AB (ref 26.6–33.0)
MCHC: 31.8 g/dL (ref 31.5–35.7)
MCV: 81 fL (ref 79–97)
Monocytes Absolute: 0.8 10*3/uL (ref 0.1–0.9)
Monocytes: 13 %
NEUTROS ABS: 2.6 10*3/uL (ref 1.4–7.0)
Neutrophils: 45 %
Platelets: 217 10*3/uL (ref 150–450)
RBC: 5.61 x10E6/uL (ref 4.14–5.80)
RDW: 15.2 % (ref 12.3–15.4)
WBC: 5.8 10*3/uL (ref 3.4–10.8)

## 2018-08-26 LAB — COMPREHENSIVE METABOLIC PANEL
A/G RATIO: 1.7 (ref 1.2–2.2)
ALBUMIN: 4.6 g/dL (ref 3.5–4.7)
ALT: 19 IU/L (ref 0–44)
AST: 21 IU/L (ref 0–40)
Alkaline Phosphatase: 47 IU/L (ref 39–117)
BILIRUBIN TOTAL: 0.7 mg/dL (ref 0.0–1.2)
BUN / CREAT RATIO: 18 (ref 10–24)
BUN: 15 mg/dL (ref 8–27)
CHLORIDE: 96 mmol/L (ref 96–106)
CO2: 18 mmol/L — ABNORMAL LOW (ref 20–29)
Calcium: 10.2 mg/dL (ref 8.6–10.2)
Creatinine, Ser: 0.85 mg/dL (ref 0.76–1.27)
GFR calc non Af Amer: 79 mL/min/{1.73_m2} (ref >59)
GFR, EST AFRICAN AMERICAN: 92 mL/min/{1.73_m2} (ref >59)
Globulin, Total: 2.7 g/dL (ref 1.5–4.5)
Glucose: 131 mg/dL — ABNORMAL HIGH (ref 65–99)
POTASSIUM: 5.2 mmol/L (ref 3.5–5.2)
Sodium: 136 mmol/L (ref 134–144)
TOTAL PROTEIN: 7.3 g/dL (ref 6.0–8.5)

## 2018-08-26 LAB — LIPID PANEL
CHOL/HDL RATIO: 3 ratio (ref 0.0–5.0)
Cholesterol, Total: 138 mg/dL (ref 100–199)
HDL: 46 mg/dL (ref >39)
LDL Calculated: 62 mg/dL (ref 0–99)
TRIGLYCERIDES: 152 mg/dL — AB (ref 0–149)
VLDL Cholesterol Cal: 30 mg/dL (ref 5–40)

## 2018-08-27 ENCOUNTER — Ambulatory Visit: Payer: Medicare Other | Attending: Cardiovascular Disease | Admitting: Physical Therapy

## 2018-08-27 ENCOUNTER — Telehealth: Payer: Self-pay

## 2018-08-27 ENCOUNTER — Encounter: Payer: Self-pay | Admitting: Physical Therapy

## 2018-08-27 DIAGNOSIS — R262 Difficulty in walking, not elsewhere classified: Secondary | ICD-10-CM | POA: Insufficient documentation

## 2018-08-27 DIAGNOSIS — R296 Repeated falls: Secondary | ICD-10-CM | POA: Insufficient documentation

## 2018-08-27 DIAGNOSIS — M48062 Spinal stenosis, lumbar region with neurogenic claudication: Secondary | ICD-10-CM | POA: Diagnosis not present

## 2018-08-27 NOTE — Telephone Encounter (Signed)
Pt's wife Howard [name] (On DPR) advised.  Scheduled follow up for 11/27/2018.  Thanks,   -Mickel Baas

## 2018-08-27 NOTE — Telephone Encounter (Signed)
-----   Message from Margo Common, [place] sent at 08/27/2018  8:32 AM EDT ----- Blood tests are essentially normal except triglycerides and glucose slightly elevated. Continue present medications and recheck in 3 months. Watch diet closely.

## 2018-08-27 NOTE — Therapy (Signed)
[facility] [address], [place], [address] Phone: [phone]   Fax:  [phone]  Physical Therapy Treatment  Patient Details  Name: Howard [name] MRN: [id number] Date of Birth: October 20, 1931 Referring Provider: [name]   Encounter Date: 08/27/2018  PT End of Session - 08/27/18 1538    Visit Number  1    Number of Visits  17    Date for PT Re-Evaluation  10/22/18       Past Medical History:  Diagnosis Date  . Arthritis   . Bell palsy   . Cancer [facility])    prostate and skin  . Chronic combined systolic and diastolic CHF, NYHA class 1 ([place])    a. 07/2014 Echo: EF 35-40%, Gr 1 DD.  Marland Kitchen Complete heart block ([place])    a. 11/2010 s/p SJM 2210 Accent DC PPM, ser# [id number].  . Depression   . Diabetes mellitus without complication ([place])   . Fall 11-10-14  . GERD (gastroesophageal reflux disease)   . History of prostate cancer   . Hyperlipidemia   . Hypertension   . LBBB (left bundle branch block)   . Left-sided Bell's palsy   . Lung cancer ([place]) 2016  . NICM (nonischemic cardiomyopathy) ([place])    a. 07/2014 Echo: EF 35-40%, mid-apicalanteroseptal DK, Gr 1 DD, mild-mod dil LA.  . Non-obstructive CAD    a. 07/2014 Abnl MV;  b. 08/2014 Cath: LM nl, LAD 30p, RI 40p, LCX nl, OM1 40, RCA dominant 30p, 70d-->Med Rx.  Marland Kitchen Poor balance   . Presence of permanent cardiac pacemaker   . Sleep apnea    a. cpap  . Vertigo   . WPW (Wolff-Parkinson-White syndrome)    a. S/P RFCA 1991.    Past Surgical History:  Procedure Laterality Date  . APPLICATION OF WOUND VAC Left 06/07/2015   Procedure: APPLICATION OF WOUND VAC;  Surgeon: Robert Bellow, MD;  Location: [facility] ORS;  Service: General;  Laterality: Left;  left upper back  . BACK SURGERY     2011  . CARDIAC CATHETERIZATION  08/26/2014   Single vessel obstructive CAD  . CARPAL TUNNEL RELEASE  04-04-15   [facility]  . CATARACT EXTRACTION  07-31-11 and 09-18-11  . Catheter ablation   1991   for WPW  . cervical fusion    . CHOLECYSTECTOMY  09-07-14  . HAND SURGERY     right 1993; left 2005  . HERNIA REPAIR  1955  . INSERT / REPLACE / REMOVE PACEMAKER    . JOINT REPLACEMENT Left 2013   knee  . JOINT REPLACEMENT Right 2004   knee  . KNEE SURGERY     left knee 1991 and 1992; right knee 1995  . LEFT HEART CATHETERIZATION WITH CORONARY ANGIOGRAM N/A 08/26/2014   Procedure: LEFT HEART CATHETERIZATION WITH CORONARY ANGIOGRAM;  Surgeon: [name], MD;  Location: [facility] CATH LAB;  Service: Cardiovascular;  Laterality: N/A;  . LUMBAR LAMINECTOMY/DECOMPRESSION MICRODISCECTOMY N/A 06/07/2014   Procedure: LUMBAR FOUR TO FIVE LUMBAR LAMINECTOMY/DECOMPRESSION MICRODISCECTOMY 1 LEVEL;  Surgeon: [name], MD;  Location: [facility] NEURO ORS;  Service: Neurosurgery;  Laterality: N/A;  . LUNG BIOPSY Right 2016   Dr [name]  . PACEMAKER INSERTION     PPM-- St Jude 11/30/10 by [name]  . PROSTATE SURGERY     cancer--1998, prostatectomy  . REPLACEMENT TOTAL KNEE     2004  . ruptured disc     1962 and 1998  .  TRIGGER FINGER RELEASE  01-24-15  . WOUND DEBRIDEMENT Left 06/07/2015   Procedure: DEBRIDEMENT WOUND;  Surgeon: Robert Bellow, MD;  Location: [facility] ORS;  Service: General;  Laterality: Left;  left upper back    There were no vitals filed for this visit.  Subjective Assessment - 08/27/18 1525    Subjective  LBP    Pertinent History  Patient is a 84 year old male reporting iwth LBP with LE weakness. Patient has hx of spinal stenosis, and and is currently receiving injections for LE weakness. Patient reports he has some L sided LBP in mornings, but his cheif complaint is his bilat LE numbess and weakness that is occuring after he walks for a period of time. Patient [place] and cannot walk up and down his driveway without the legs feeling like they "are going to give out". Pt denies N/V, B&B changes, unexplained weight fluctuation, saddle paresthesia, fever, night sweats, or  unrelenting night pain at this time.    How long can you sit comfortably?  unlimited    How long can you stand comfortably?  37mins    How long can you walk comfortably?  2 min    Patient Stated Goals  Walk for extended period of time, walk up/down the driveway    Currently in Pain?  Yes    Pain Score  0-No pain    Pain Location  Back    Pain Orientation  Left;Posterior;Lower    Pain Descriptors / Indicators  Aching;Dull;Nagging    Pain Type  Chronic pain    Pain Radiating Towards  LE numbess and weakness    Pain Onset  Other (comment)    Pain Frequency  Occasional    Aggravating Factors   walking uphill, prolonged standing/walking    Pain Relieving Factors  Sitting        Lumbar laminectomy 05/2014;     ROM All lumbar motions wnl w/ pain w/ L lateral bending on L LB  Hip motions restricted   Strength Hip flex: 5/5 bilat Hip abd 4-/5 bilat Hip Ext in prone: 3+/5 bilat Hip IR: 5/5 bilat Hip ER: 4+/5 bilat Knee flex 5/5 bilat Knee ext 5/5 bilat   Ankle DF 4+/5 bilat   Special Tests/ Other Reflexes 2+ patellar reflex (-) slump bilat (-) SLR (-) Crossed SLR (+) Elys bilat (+) 90/90 bilat severe (+) FABER bilat (+) FADIR bilat (+) Obers bilat (-) SI cluster Noted trigger points at bilat lumbar paraspinals No pain provocation or relief with lumbar CPA or UPA grade I-II, noted hypomobility in these segments Stair Ambulation: Pt is able to demonstrate reciprocal gait up/down 4 steps over 3 trials with unilateral handrail support, with LE numbness/weakness increasing with stair ascension  Hill walking 27ft down/14ft up (approx 20d grade); after walking uphill pt immediately has to sit d/t BLE feeling like they are going to "give out" Posture/Gait: patient with decreased lumbar lordosis with forward head rounded shoulder posture 5xSTS:15sec Patient unable to complete walk test at this time d/t severity of sx   Ther-Ex -Seated hamstring stretch x  30sec hold each LE (HEP)  with max cuing initially for proper technique with good carry over following - Attempted seated glute stretch, patient unable to complete; modified to supine x30sec hold each LE (HEP) - Hooklying lumbar rotations x20 (HEP) with max cuing initially to maintain spine contact on mat table with good carry over following -Education on PT role and there-ex role on pain and  strengthening/lengthening muscles surrounding the spine to increase stability and decrease pain                             PT Education - 08/27/18 1538    Education Details  Patient was educated on diagnosis, anatomy and pathology involved, prognosis, role of PT, and was given an HEP, demonstrating exercise with proper form following verbal and tactile cues, and was given a paper hand out to continue exercise at home. Pt was educated on and agreed to plan of care.    Person(s) Educated  Patient    Methods  Explanation;Demonstration;Tactile cues;Verbal cues;Handout    Comprehension  Verbalized understanding;Tactile cues required;Verbal cues required;Returned demonstration       PT Short Term Goals - 08/28/18 0858      PT SHORT TERM GOAL #1   Title  Pt will be independent with HEP in order to improve strength, flexibility, and balance in order to decrease fall risk and improve function at home and work.    Time  4    Period  Weeks    Status  New        PT Long Term Goals - 08/28/18 0921      PT LONG TERM GOAL #1   Title  Patient will report ambulation over 10 min without LE numbess/weakness to be able to complete ADLs    Baseline  08/28/18: Patient reports ambulation over 34mins before LE sx prevent further ambulation    Time  8    Period  Weeks    Status  New      PT LONG TERM GOAL #2   Title  Pt will decrease 5TSTS by at least 3 seconds in order to demonstrate clinically significant improvement in LE strength    Baseline  08/28/18: 15sec    Time  8    Period  Weeks    Status  New      PT LONG  TERM GOAL #3   Title  Patient will be able to ambulate uphill without LE sx in order to retrieve his morning paper    Baseline  08/28/18: 39ft up (approx 20d grade); after walking uphill pt immediately has to sit d/t BLE feeling like they are going to "give out"    Time  8    Period  Weeks    Status  New            Plan - 08/28/18 0932    Clinical Impression Statement  Patient is a 84 year old male presenting with BLE numbness/weakness secondary to lumbar spinal stenosis with neurogenic claudication. Patient with current impairments in LE endurance, postural/gait deficits, decreased activity tolerance, balance deficits, and LE/core weakness. Patient is currently unable to ambulate for longer than 2 mins, ambulate uphill, and negotiate stairs safely. Patient is inhibited from participating in his ADLs, attending family events, and work in his clock shop in his basement. Pt will benefit from skilled PT to address these impairments to return patient to safe, PLOF.    History and Personal Factors relevant to plan of care:  post lumbar laminectomy, spinal stenosis, cortisone shot every 76months    Clinical Presentation  Evolving    Clinical Presentation due to:  2 personal factors/comorbidities, 3 body systems/activity limitations/participation restrictions     Clinical Decision Making  Moderate    Rehab Potential  Fair    Clinical Impairments Affecting Rehab Potential  (+) social support, motivation (-)  age, chronicity of pain, other comorbidities, multiple failed attempts at pain relief (injection, sx, past PT)    PT Frequency  2x / week    PT Duration  8 weeks    PT Treatment/Interventions  ADLs/Self Care Home Management;Cryotherapy;Traction;Gait training;Therapeutic exercise;Patient/family education;Manual techniques;Passive range of motion;Dry needling;Taping;Neuromuscular re-education;Balance training;Stair training;Electrical Stimulation;Ultrasound;Aquatic Therapy;Moist Heat;Iontophoresis  4mg /ml Dexamethasone;Therapeutic activities    PT Next Visit Plan  FOTO; 6MWT; DGI/BERG?, techniques for muscle tension relief    PT Home Exercise Plan  lower lumbar rotations, seated hamstring stretch, supine glute stretch    Consulted and Agree with Plan of Care  Patient       Patient will benefit from skilled therapeutic intervention in order to improve the following deficits and impairments:  Abnormal gait, Decreased balance, Decreased endurance, Difficulty walking, Decreased range of motion, Improper body mechanics, Impaired tone, Decreased activity tolerance, Decreased strength, Increased fascial restricitons, Impaired flexibility, Postural dysfunction, Pain  Visit Diagnosis: Spinal stenosis of lumbar region with neurogenic claudication     Problem List Patient Active Problem List   Diagnosis Date Noted  . Malignant neoplasm of right lung ([place]) 04/18/2016  . CAD (coronary artery disease) 04/01/2016  . Adenocarcinoma ([place]) 04/01/2016  . Skin cyst 12/26/2015  . GERD (gastroesophageal reflux disease) 06/16/2015  . Abscess of back 06/06/2015  . Vertigo 03/27/2015  . Carpal tunnel syndrome 10/28/2014  . Status post cholecystectomy 09/29/2014  . Disease of digestive tract 09/29/2014  . Cardiomyopathy ([place]) 09/26/2014  . Type 2 diabetes mellitus without complications ([place])   . Sleep apnea   . Spinal stenosis, lumbar region, with neurogenic claudication 06/07/2014  . Lumbar stenosis with neurogenic claudication 06/07/2014  . Abnormal gait 08/20/2012  . H/O total knee replacement 08/20/2012  . Arthritis of knee, degenerative 08/20/2012  . Pacemaker-St.Jude 08/03/2012  . Cardiac conduction disorder 06/19/2012  . Acid reflux 06/18/2012  . 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 Triggering of digit 03/25/2012  . Hyperlipidemia 12/23/2011  . Essential hypertension 03/23/2011  . Atrioventricular block, complete ([place]) 03/23/2011  . Complete atrioventricular block ([place]) 03/23/2011    Shelton Silvas PT, DPT Shelton Silvas 08/28/2018, 10:41 AM  [facility] [address] [place], [address] Phone: [phone]   Fax:  [phone]  Name: Howard [name] MRN: [id number] Date of Birth: September 28, 1931

## 2018-08-31 ENCOUNTER — Encounter

## 2018-08-31 ENCOUNTER — Ambulatory Visit: Payer: Medicare Other | Admitting: Cardiovascular Disease

## 2018-08-31 ENCOUNTER — Encounter: Payer: Medicare Other | Admitting: Physical Therapy

## 2018-09-02 ENCOUNTER — Encounter: Payer: Self-pay | Admitting: Physical Therapy

## 2018-09-02 ENCOUNTER — Ambulatory Visit: Payer: Medicare Other | Admitting: Physical Therapy

## 2018-09-02 DIAGNOSIS — M48062 Spinal stenosis, lumbar region with neurogenic claudication: Secondary | ICD-10-CM | POA: Diagnosis not present

## 2018-09-02 DIAGNOSIS — R262 Difficulty in walking, not elsewhere classified: Secondary | ICD-10-CM | POA: Diagnosis not present

## 2018-09-02 DIAGNOSIS — R296 Repeated falls: Secondary | ICD-10-CM | POA: Diagnosis not present

## 2018-09-02 NOTE — Therapy (Signed)
[facility] [address], [place], [address] Phone: [phone]   Fax:  [phone]  Physical Therapy Treatment  Patient Details  Name: Howard [name] MRN: [id number] Date of Birth: [birth year] Referring Provider: [name]   Encounter Date: 09/02/2018  PT End of Session - 09/02/18 0959    Visit Number  2    Number of Visits  17    Date for PT Re-Evaluation  10/22/18    PT Start Time  0945    PT Stop Time  1030    PT Time Calculation (min)  45 min    Activity Tolerance  Patient tolerated treatment well    Behavior During Therapy  [facility] for tasks assessed/performed       Past Medical History:  Diagnosis Date  . Arthritis   . Bell palsy   . Cancer [facility])    prostate and skin  . Chronic combined systolic and diastolic CHF, NYHA class 1 ([place])    a. 07/2014 Echo: EF 35-40%, Gr 1 DD.  Marland Kitchen Complete heart block ([place])    a. 11/2010 s/p SJM 2210 Accent DC PPM, ser# [id number].  . Depression   . Diabetes mellitus without complication ([place])   . Fall 11-10-14  . GERD (gastroesophageal reflux disease)   . History of prostate cancer   . Hyperlipidemia   . Hypertension   . LBBB (left bundle branch block)   . Left-sided Bell's palsy   . Lung cancer ([place]) 2016  . NICM (nonischemic cardiomyopathy) ([place])    a. 07/2014 Echo: EF 35-40%, mid-apicalanteroseptal DK, Gr 1 DD, mild-mod dil LA.  . Non-obstructive CAD    a. 07/2014 Abnl MV;  b. 08/2014 Cath: LM nl, LAD 30p, RI 40p, LCX nl, OM1 40, RCA dominant 30p, 70d-->Med Rx.  Marland Kitchen Poor balance   . Presence of permanent cardiac pacemaker   . Sleep apnea    a. cpap  . Vertigo   . WPW (Wolff-Parkinson-White syndrome)    a. S/P RFCA 1991.    Past Surgical History:  Procedure Laterality Date  . APPLICATION OF WOUND VAC Left 06/07/2015   Procedure: APPLICATION OF WOUND VAC;  Surgeon: Robert Bellow, MD;  Location: [facility] ORS;  Service: General;  Laterality: Left;  left upper back  .  BACK SURGERY     2011  . CARDIAC CATHETERIZATION  08/26/2014   Single vessel obstructive CAD  . CARPAL TUNNEL RELEASE  04-04-15   [facility]  . CATARACT EXTRACTION  07-31-11 and 09-18-11  . Catheter ablation  1991   for WPW  . cervical fusion    . CHOLECYSTECTOMY  09-07-14  . HAND SURGERY     right 1993; left 2005  . HERNIA REPAIR  1955  . INSERT / REPLACE / REMOVE PACEMAKER    . JOINT REPLACEMENT Left 2013   knee  . JOINT REPLACEMENT Right 2004   knee  . KNEE SURGERY     left knee 1991 and 1992; right knee 1995  . LEFT HEART CATHETERIZATION WITH CORONARY ANGIOGRAM N/A 08/26/2014   Procedure: LEFT HEART CATHETERIZATION WITH CORONARY ANGIOGRAM;  Surgeon: [name], MD;  Location[facility] LAB;  Service: Cardiovascular;  Laterality: N/A;  . LUMBAR LAMINECTOMY/DECOMPRESSION MICRODISCECTOMY N/A 06/07/2014   Procedure: LUMBAR FOUR TO FIVE LUMBAR LAMINECTOMY/DECOMPRESSION MICRODISCECTOMY 1 LEVEL;  Surgeon: [name], MD;  Location: [facility]URO ORS;  Service: Neurosurgery;  Laterality: N/A;  . LUNG BIOPSY Right 2016   Dr [name]  .  PACEMAKER INSERTION     PPM-- St Jude 11/30/10 by [name]  . PROSTATE SURGERY     cancer--1998, prostatectomy  . REPLACEMENT TOTAL KNEE     2004  . ruptured disc     1962 and 1998  . TRIGGER FINGER RELEASE  01-24-15  . WOUND DEBRIDEMENT Left 06/07/2015   Procedure: DEBRIDEMENT WOUND;  Surgeon: Robert Bellow, MD;  Location: [facility] ORS;  Service: General;  Laterality: Left;  left upper back    There were no vitals filed for this visit.  Subjective Assessment - 09/02/18 0953    Subjective  Patient reports his RLE has stopped "locking and giving out" and it is only his LLE now. Patient reports compliance with his HEP with no questions or concerns.     Pertinent History  Patient is a 84 year old male reporting iwth LBP with LE weakness. Patient has hx of spinal stenosis, and and is currently receiving injections for LE weakness. Patient reports he has some L sided LBP in  mornings, but his cheif complaint is his bilat LE numbess and weakness that is occuring after he walks for a period of time. Patient reports [place] and cannot walk up and down his driveway without the legs feeling like they "are going to give out". Pt denies N/V, B&B changes, unexplained weight fluctuation, saddle paresthesia, fever, night sweats, or unrelenting night pain at this time.    How long can you sit comfortably?  unlimited    How long can you stand comfortably?  30mins    How long can you walk comfortably?  2 min    Patient Stated Goals  Walk for extended period of time, walk up/down the driveway    Pain Onset  Other (comment)       Ther-Ex - Treadmill test upright without handrail support, upright and patient is able to walk at 0.52mph speed for 24min 15 sec. In flexed posture with handrail support patient is able to walk at 0.67mph speed for 75min and reports he was just having some "calf tightness" and that is what inhibited him from walking further - Hamstring stretch 30sec hold each side with min cuing for proper form to hold stretch for full 30sec hold - Supine glute stretch 30sec hold each side with good carry over - Hooklying lower trunk rotations 20x with cuing to prevent LB/pelvis lift off mat table - Education on aquatic therapy for strengthening in safe environment.    Manual - manual hamstring and glute stretching with hamstring contract/relax 10sec contract 20sec relax inc ROM as able                    PT Education - 09/02/18 0958    Education Details  Exercise form    Person(s) Educated  Patient    Methods  Explanation;Demonstration;Verbal cues    Comprehension  Verbalized understanding;Returned demonstration;Verbal cues required       PT Short Term Goals - 08/28/18 0858      PT SHORT TERM GOAL #1   Title  Pt will be independent with HEP in order to improve strength, flexibility, and balance in order to decrease fall risk and improve  function at home and work.    Time  4    Period  Weeks    Status  New        PT Long Term Goals - 08/28/18 2595      PT LONG TERM GOAL #1   Title  Patient will report ambulation over 10 min without LE numbess/weakness to be able to complete ADLs    Baseline  08/28/18: Patient reports ambulation over 76mins before LE sx prevent further ambulation    Time  8    Period  Weeks    Status  New      PT LONG TERM GOAL #2   Title  Pt will decrease 5TSTS by at least 3 seconds in order to demonstrate clinically significant improvement in LE strength    Baseline  08/28/18: 15sec    Time  8    Period  Weeks    Status  New      PT LONG TERM GOAL #3   Title  Patient will be able to ambulate uphill without LE sx in order to retrieve his morning paper    Baseline  08/28/18: 86ft up (approx 20d grade); after walking uphill pt immediately has to sit d/t BLE feeling like they are going to "give out"    Time  8    Period  Weeks    Status  New            Plan - 09/02/18 1201    Clinical Impression Statement  PT assessed patient's ability to ambulate in an upright vs. flexed forward posture. Patient is able to walk much longer with less sx in lumbar flex posture, indicating severe stenosis. PT suggested aquatic therapy for LE/core strenghtening in safe enviornment that will limit pain/LOB. Patient agrees that he has enjoyed aquatic therapy in the past and he may benefit from this again. PT reviewed HEp with minor corrections, and with some tension relief following manual techniques.     Rehab Potential  Fair    Clinical Impairments Affecting Rehab Potential  (+) social support, motivation (-) age, chronicity of pain, other comorbidities, multiple failed attempts at pain relief (injection, sx, past PT)    PT Frequency  2x / week    PT Duration  8 weeks    PT Treatment/Interventions  ADLs/Self Care Home Management;Cryotherapy;Traction;Gait training;Therapeutic exercise;Patient/family education;Manual  techniques;Passive range of motion;Dry needling;Taping;Neuromuscular re-education;Balance training;Stair training;Electrical Stimulation;Ultrasound;Aquatic Therapy;Moist Heat;Iontophoresis 4mg /ml Dexamethasone;Therapeutic activities    PT Next Visit Plan  FOTO;, techniques for muscle tension relief    PT Home Exercise Plan  lower lumbar rotations, seated hamstring stretch, supine glute stretch    Consulted and Agree with Plan of Care  Patient       Patient will benefit from skilled therapeutic intervention in order to improve the following deficits and impairments:  Abnormal gait, Decreased balance, Decreased endurance, Difficulty walking, Decreased range of motion, Improper body mechanics, Impaired tone, Decreased activity tolerance, Decreased strength, Increased fascial restricitons, Impaired flexibility, Postural dysfunction, Pain  Visit Diagnosis: Spinal stenosis of lumbar region with neurogenic claudication     Problem List Patient Active Problem List   Diagnosis Date Noted  . Malignant neoplasm of right lung ([place]) 04/18/2016  . CAD (coronary artery disease) 04/01/2016  . Adenocarcinoma ([place]) 04/01/2016  . Skin cyst 12/26/2015  . GERD (gastroesophageal reflux disease) 06/16/2015  . Abscess of back 06/06/2015  . Vertigo 03/27/2015  . Carpal tunnel syndrome 10/28/2014  . Status post cholecystectomy 09/29/2014  . Disease of digestive tract 09/29/2014  . Cardiomyopathy ([place]) 09/26/2014  . Type 2 diabetes mellitus without complications ([place])   . Sleep apnea   . Spinal stenosis, lumbar region, with neurogenic claudication 06/07/2014  . Lumbar stenosis with neurogenic claudication 06/07/2014  . Abnormal gait 08/20/2012  . H/O total knee  replacement 08/20/2012  . Arthritis of knee, degenerative 08/20/2012  . Pacemaker-St.Jude 08/03/2012  . Cardiac conduction disorder 06/19/2012  . Acid reflux 06/18/2012  . 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 Triggering of digit 03/25/2012  .  Hyperlipidemia 12/23/2011  . Essential hypertension 03/23/2011  . Atrioventricular block, complete ([place]) 03/23/2011  . Complete atrioventricular block ([place]) 03/23/2011   Shelton Silvas PT, DPT Shelton Silvas 09/02/2018, 12:22 PM  [facility] [address], [place], [address] Phone: [phone]   Fax:  [phone]  Name: JEREMIYAH CULLENS MRN: [id number] Date of Birth: Jun 21, 1931

## 2018-09-08 ENCOUNTER — Ambulatory Visit: Payer: Medicare Other | Admitting: Physical Therapy

## 2018-09-08 ENCOUNTER — Encounter: Payer: Self-pay | Admitting: Physical Therapy

## 2018-09-08 DIAGNOSIS — M48062 Spinal stenosis, lumbar region with neurogenic claudication: Secondary | ICD-10-CM

## 2018-09-08 DIAGNOSIS — R262 Difficulty in walking, not elsewhere classified: Secondary | ICD-10-CM | POA: Diagnosis not present

## 2018-09-08 DIAGNOSIS — R296 Repeated falls: Secondary | ICD-10-CM | POA: Diagnosis not present

## 2018-09-08 NOTE — Therapy (Signed)
[place] [facility] [address] [place], [address] Phone: [phone]   Fax:  [phone]  Physical Therapy Treatment  Patient Details  Name: Howard [name] MRN: [id number] Date of Birth: [birth year] Referring Provider: [name]   Encounter Date: 09/08/2018  PT End of Session - 09/08/18 1541    Visit Number  3    Number of Visits  17    Date for PT Re-Evaluation  10/22/18    PT Start Time  0315    PT Stop Time  0400    PT Time Calculation (min)  45 min    Activity Tolerance  Patient tolerated treatment well    Behavior During Therapy  [facility] for tasks assessed/performed       Past Medical History:  Diagnosis Date  . Arthritis   . Bell palsy   . Cancer [facility])    prostate and skin  . Chronic combined systolic and diastolic CHF, NYHA class 1 ([place])    a. 07/2014 Echo: EF 35-40%, Gr 1 DD.  Marland Kitchen Complete heart block ([place])    a. 11/2010 s/p SJM 2210 Accent DC PPM, ser# [id number].  . Depression   . Diabetes mellitus without complication ([place])   . Fall 11-10-14  . GERD (gastroesophageal reflux disease)   . History of prostate cancer   . Hyperlipidemia   . Hypertension   . LBBB (left bundle branch block)   . Left-sided Bell's palsy   . Lung cancer ([place]) 2016  . NICM (nonischemic cardiomyopathy) ([place])    a. 07/2014 Echo: EF 35-40%, mid-apicalanteroseptal DK, Gr 1 DD, mild-mod dil LA.  . Non-obstructive CAD    a. 07/2014 Abnl MV;  b. 08/2014 Cath: LM nl, LAD 30p, RI 40p, LCX nl, OM1 40, RCA dominant 30p, 70d-->Med Rx.  Marland Kitchen Poor balance   . Presence of permanent cardiac pacemaker   . Sleep apnea    a. cpap  . Vertigo   . WPW (Wolff-Parkinson-White syndrome)    a. S/P RFCA 1991.    Past Surgical History:  Procedure Laterality Date  . APPLICATION OF WOUND VAC Left 06/07/2015   Procedure: APPLICATION OF WOUND VAC;  Surgeon: Robert Bellow, MD;  Location: [facility] ORS;  Service: General;  Laterality: Left;  left upper back  .  BACK SURGERY     2011  . CARDIAC CATHETERIZATION  08/26/2014   Single vessel obstructive CAD  . CARPAL TUNNEL RELEASE  04-04-15   [facility]  . CATARACT EXTRACTION  07-31-11 and 09-18-11  . Catheter ablation  1991   for WPW  . cervical fusion    . CHOLECYSTECTOMY  09-07-14  . HAND SURGERY     right 1993; left 2005  . HERNIA REPAIR  1955  . INSERT / REPLACE / REMOVE PACEMAKER    . JOINT REPLACEMENT Left 2013   knee  . JOINT REPLACEMENT Right 2004   knee  . KNEE SURGERY     left knee 1991 and 1992; right knee 1995  . LEFT HEART CATHETERIZATION WITH CORONARY ANGIOGRAM N/A 08/26/2014   Procedure: LEFT HEART CATHETERIZATION WITH CORONARY ANGIOGRAM;  Surgeon: [name], MD;  Location: [facility] CATH LAB;  Service: Cardiovascular;  Laterality: N/A;  . LUMBAR LAMINECTOMY/DECOMPRESSION MICRODISCECTOMY N/A 06/07/2014   Procedure: LUMBAR FOUR TO FIVE LUMBAR LAMINECTOMY/DECOMPRESSION MICRODISCECTOMY 1 LEVEL;  Surgeon: [name], MD;  Location: [place] NEURO ORS;  Service: Neurosurgery;  Laterality: N/A;  . LUNG BIOPSY Right 2016   Dr [name]  .  PACEMAKER INSERTION     PPM-- St Jude 11/30/10 by [name]  . PROSTATE SURGERY     cancer--1998, prostatectomy  . REPLACEMENT TOTAL KNEE     2004  . ruptured disc     1962 and 1998  . TRIGGER FINGER RELEASE  01-24-15  . WOUND DEBRIDEMENT Left 06/07/2015   Procedure: DEBRIDEMENT WOUND;  Surgeon: Robert Bellow, MD;  Location: [facility] ORS;  Service: General;  Laterality: Left;  left upper back    There were no vitals filed for this visit.  Subjective Assessment - 09/08/18 1521    Subjective  Patient continues to report minimal LBP, but that his LLE is "giving out" after walking around the house for short distances. Patient reports compliance with his HEP with no questions or concerns.     Pertinent History  Patient is a 84 year old male reporting iwth LBP with LE weakness. Patient has hx of spinal stenosis, and and is currently receiving injections for LE weakness. Patient  reports he has some L sided LBP in mornings, but his cheif complaint is his bilat LE numbess and weakness that is occuring after he walks for a period of time. Patient [place] and cannot walk up and down his driveway without the legs feeling like they "are going to give out". Pt denies N/V, B&B changes, unexplained weight fluctuation, saddle paresthesia, fever, night sweats, or unrelenting night pain at this time.    How long can you sit comfortably?  unlimited    How long can you stand comfortably?  47mins    How long can you walk comfortably?  2 min    Patient Stated Goals  Walk for extended period of time, walk up/down the driveway    Pain Onset  Other (comment)          Ther-Ex - 62min on recumbant bike with patient leaning forward, for safe aerobic/muscular endurance activity - Hooklying lumbar rotations x20 with good carry over from last session - Bridge exercise attempted with "hamstring cramp"; completed glute set prior and was able to complete following without "cramping" 3x 10 with min cuing for proper form with eccentric control   Manual - STM with trigger point release to bilat lumbar paraspinals   Heat pack applied last 5 mins of treatment session (unbilled)                    PT Education - 09/08/18 1539    Education Details  exercise form    Person(s) Educated  Patient    Methods  Explanation;Tactile cues;Verbal cues    Comprehension  Verbalized understanding;Verbal cues required;Tactile cues required       PT Short Term Goals - 08/28/18 0858      PT SHORT TERM GOAL #1   Title  Pt will be independent with HEP in order to improve strength, flexibility, and balance in order to decrease fall risk and improve function at home and work.    Time  4    Period  Weeks    Status  New        PT Long Term Goals - 08/28/18 1062      PT LONG TERM GOAL #1   Title  Patient will report ambulation over 10 min without LE numbess/weakness to  be able to complete ADLs    Baseline  08/28/18: Patient reports ambulation over 69mins before LE sx prevent further ambulation    Time  8  Period  Weeks    Status  New      PT LONG TERM GOAL #2   Title  Pt will decrease 5TSTS by at least 3 seconds in order to demonstrate clinically significant improvement in LE strength    Baseline  08/28/18: 15sec    Time  8    Period  Weeks    Status  New      PT LONG TERM GOAL #3   Title  Patient will be able to ambulate uphill without LE sx in order to retrieve his morning paper    Baseline  08/28/18: 69ft up (approx 20d grade); after walking uphill pt immediately has to sit d/t BLE feeling like they are going to "give out"    Time  8    Period  Weeks    Status  New            Plan - 09/08/18 1543    Clinical Impression Statement  PT led patient through low level core/hip strengthening, which he was able to complete with accuracy following PT cuing. Patient with tension relief and decreased pain noted following manual techniques and heat application.     History and Personal Factors relevant to plan of care:  PACEMAKER, post lumbar laminectomy, spinal stenosis, cortisone shot every 3 months    Rehab Potential  Fair    Clinical Impairments Affecting Rehab Potential  (+) social support, motivation (-) age, chronicity of pain, other comorbidities, multiple failed attempts at pain relief (injection, sx, past PT)    PT Frequency  2x / week    PT Duration  8 weeks    PT Treatment/Interventions  ADLs/Self Care Home Management;Cryotherapy;Traction;Gait training;Therapeutic exercise;Patient/family education;Manual techniques;Passive range of motion;Dry needling;Taping;Neuromuscular re-education;Balance training;Stair training;Electrical Stimulation;Ultrasound;Aquatic Therapy;Moist Heat;Iontophoresis 4mg /ml Dexamethasone;Therapeutic activities    PT Home Exercise Plan  lower lumbar rotations, seated hamstring stretch, supine glute stretch    Consulted and  Agree with Plan of Care  Patient       Patient will benefit from skilled therapeutic intervention in order to improve the following deficits and impairments:  Abnormal gait, Decreased balance, Decreased endurance, Difficulty walking, Decreased range of motion, Improper body mechanics, Impaired tone, Decreased activity tolerance, Decreased strength, Increased fascial restricitons, Impaired flexibility, Postural dysfunction, Pain  Visit Diagnosis: Spinal stenosis of lumbar region with neurogenic claudication     Problem List Patient Active Problem List   Diagnosis Date Noted  . Malignant neoplasm of right lung ([place]) 04/18/2016  . CAD (coronary artery disease) 04/01/2016  . Adenocarcinoma ([place]) 04/01/2016  . Skin cyst 12/26/2015  . GERD (gastroesophageal reflux disease) 06/16/2015  . Abscess of back 06/06/2015  . Vertigo 03/27/2015  . Carpal tunnel syndrome 10/28/2014  . Status post cholecystectomy 09/29/2014  . Disease of digestive tract 09/29/2014  . Cardiomyopathy ([place]) 09/26/2014  . Type 2 diabetes mellitus without complications ([place])   . Sleep apnea   . Spinal stenosis, lumbar region, with neurogenic claudication 06/07/2014  . Lumbar stenosis with neurogenic claudication 06/07/2014  . Abnormal gait 08/20/2012  . H/O total knee replacement 08/20/2012  . Arthritis of knee, degenerative 08/20/2012  . Pacemaker-St.Jude 08/03/2012  . Cardiac conduction disorder 06/19/2012  . Acid reflux 06/18/2012  . 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 Triggering of digit 03/25/2012  . Hyperlipidemia 12/23/2011  . Essential hypertension 03/23/2011  . Atrioventricular block, complete ([place]) 03/23/2011  . Complete atrioventricular block ([place]) 03/23/2011   Shelton Silvas PT, DPT Shelton Silvas 09/08/2018, 4:03 PM  [facility] [address], [place], [address] Phone: [phone]   Fax:  [phone]  Name: KAHARI CRITZER MRN: [id number] Date of Birth: 01-Jul-1931

## 2018-09-10 ENCOUNTER — Ambulatory Visit: Payer: Medicare Other

## 2018-09-10 ENCOUNTER — Other Ambulatory Visit: Payer: Self-pay

## 2018-09-10 DIAGNOSIS — R262 Difficulty in walking, not elsewhere classified: Secondary | ICD-10-CM | POA: Diagnosis not present

## 2018-09-10 DIAGNOSIS — R296 Repeated falls: Secondary | ICD-10-CM

## 2018-09-10 DIAGNOSIS — M48062 Spinal stenosis, lumbar region with neurogenic claudication: Secondary | ICD-10-CM

## 2018-09-10 NOTE — Therapy (Signed)
[place] MAIN [facility] SERVICES [address] [place], [place], [address] Phone: [phone]   Fax:  [phone]  Physical Therapy Treatment  Patient Details  Name: Howard [name] MRN: [id number] Date of Birth: Nov 18, 1931 Referring Provider: [name]   Encounter Date: 09/10/2018  PT End of Session - 09/10/18 1553    Visit Number  4    Number of Visits  17    Date for PT Re-Evaluation  10/22/18    PT Start Time  1400    PT Stop Time  1445    PT Time Calculation (min)  45 min    Activity Tolerance  Patient tolerated treatment well    Behavior During Therapy  [facility] for tasks assessed/performed       Past Medical History:  Diagnosis Date  . Arthritis   . Bell palsy   . Cancer [facility])    prostate and skin  . Chronic combined systolic and diastolic CHF, NYHA class 1 ([place])    a. 07/2014 Echo: EF 35-40%, Gr 1 DD.  Marland Kitchen Complete heart block ([place])    a. 11/2010 s/p SJM 2210 Accent DC PPM, ser# [id number].  . Depression   . Diabetes mellitus without complication ([place])   . Fall 11-10-14  . GERD (gastroesophageal reflux disease)   . History of prostate cancer   . Hyperlipidemia   . Hypertension   . LBBB (left bundle branch block)   . Left-sided Bell's palsy   . Lung cancer ([place]) 2016  . NICM (nonischemic cardiomyopathy) ([place])    a. 07/2014 Echo: EF 35-40%, mid-apicalanteroseptal DK, Gr 1 DD, mild-mod dil LA.  . Non-obstructive CAD    a. 07/2014 Abnl MV;  b. 08/2014 Cath: LM nl, LAD 30p, RI 40p, LCX nl, OM1 40, RCA dominant 30p, 70d-->Med Rx.  Marland Kitchen Poor balance   . Presence of permanent cardiac pacemaker   . Sleep apnea    a. cpap  . Vertigo   . WPW (Wolff-Parkinson-White syndrome)    a. S/P RFCA 1991.    Past Surgical History:  Procedure Laterality Date  . APPLICATION OF WOUND VAC Left 06/07/2015   Procedure: APPLICATION OF WOUND VAC;  Surgeon: Robert Bellow, MD;  Location: [facility] ORS;  Service: General;  Laterality: Left;  left upper back  . BACK  SURGERY     2011  . CARDIAC CATHETERIZATION  08/26/2014   Single vessel obstructive CAD  . CARPAL TUNNEL RELEASE  04-04-15   [facility]  . CATARACT EXTRACTION  07-31-11 and 09-18-11  . Catheter ablation  1991   for WPW  . cervical fusion    . CHOLECYSTECTOMY  09-07-14  . HAND SURGERY     right 1993; left 2005  . HERNIA REPAIR  1955  . INSERT / REPLACE / REMOVE PACEMAKER    . JOINT REPLACEMENT Left 2013   knee  . JOINT REPLACEMENT Right 2004   knee  . KNEE SURGERY     left knee 1991 and 1992; right knee 1995  . LEFT HEART CATHETERIZATION WITH CORONARY ANGIOGRAM N/A 08/26/2014   Procedure: LEFT HEART CATHETERIZATION WITH CORONARY ANGIOGRAM;  Surgeon: [name], MD;  Location: [facility] CATH LAB;  Service: Cardiovascular;  Laterality: N/A;  . LUMBAR LAMINECTOMY/DECOMPRESSION MICRODISCECTOMY N/A 06/07/2014   Procedure: LUMBAR FOUR TO FIVE LUMBAR LAMINECTOMY/DECOMPRESSION MICRODISCECTOMY 1 LEVEL;  Surgeon: [name], MD;  Location: [place] NEURO ORS;  Service: Neurosurgery;  Laterality: N/A;  . LUNG BIOPSY Right 2016   Dr [name]  .  PACEMAKER INSERTION     PPM-- St Jude 11/30/10 by [name]  . PROSTATE SURGERY     cancer--1998, prostatectomy  . REPLACEMENT TOTAL KNEE     2004  . ruptured disc     1962 and 1998  . TRIGGER FINGER RELEASE  01-24-15  . WOUND DEBRIDEMENT Left 06/07/2015   Procedure: DEBRIDEMENT WOUND;  Surgeon: Robert Bellow, MD;  Location: [facility] ORS;  Service: General;  Laterality: Left;  left upper back    There were no vitals filed for this visit.  Subjective Assessment - 09/10/18 1550    Subjective  Pt denies any significant low back pain despite numerous surgeries througout neck and back except when walking uphill. Pt reports having numbess in either or BLEs with resulting weakness and giving way primarily in the quads. Pt states this normally happens on the L, but has reently happened on the r when ascending steps.     Pertinent History  Patient is a 84 year old male reporting iwth LBP  with LE weakness. Patient has hx of spinal stenosis, and and is currently receiving injections for LE weakness. Patient reports he has some L sided LBP in mornings, but his cheif complaint is his bilat LE numbess and weakness that is occuring after he walks for a period of time. Patient reports [place] and cannot walk up and down his driveway without the legs feeling like they "are going to give out". Pt denies N/V, B&B changes, unexplained weight fluctuation, saddle paresthesia, fever, night sweats, or unrelenting night pain at this time.      Enters/exits pool via ramp   Ambulation with blue dumbbells  Fwd 4 L  Side 4 L   Core with LE strength, B 20x ea  Hip flex/ext  Hip abd/add  Squats  Core with UE strength, wall sit position  [place], 20x ea   Sh abd/add   Sh flex/ext   Sh horiz abd/add  Bench, LE with core stabilization, 2 min ea  Bike  Scissor  Flutter    Seated static stretching, B 3x ea  Ham/gastroc  SKTC                         PT Education - 09/10/18 1552    Education Details  Properties and benefits of water as it pertains to exercise. Core stabilization/strengthening. static stretching, Squats     Person(s) Educated  Patient    Methods  Explanation;Demonstration    Comprehension  Verbalized understanding;Returned demonstration;Verbal cues required;Tactile cues required;Need further instruction       PT Short Term Goals - 08/28/18 0858      PT SHORT TERM GOAL #1   Title  Pt will be independent with HEP in order to improve strength, flexibility, and balance in order to decrease fall risk and improve function at home and work.    Time  4    Period  Weeks    Status  New        PT Long Term Goals - 08/28/18 6222      PT LONG TERM GOAL #1   Title  Patient will report ambulation over 10 min without LE numbess/weakness to be able to complete ADLs    Baseline  08/28/18: Patient reports ambulation over 71mins before LE sx prevent  further ambulation    Time  8    Period  Weeks    Status  New      PT  LONG TERM GOAL #2   Title  Pt will decrease 5TSTS by at least 3 seconds in order to demonstrate clinically significant improvement in LE strength    Baseline  08/28/18: 15sec    Time  8    Period  Weeks    Status  New      PT LONG TERM GOAL #3   Title  Patient will be able to ambulate uphill without LE sx in order to retrieve his morning paper    Baseline  08/28/18: 29ft up (approx 20d grade); after walking uphill pt immediately has to sit d/t BLE feeling like they are going to "give out"    Time  8    Period  Weeks    Status  New            Plan - 09/10/18 1554    Clinical Impression Statement  Pt requires verbal and tactile cues for correct technique. Mldly impulsive to just move in the water. Pt denies any pain or numbness durig session exercises. Continue to progress strength     Rehab Potential  Fair    Clinical Impairments Affecting Rehab Potential  (+) social support, motivation (-) age, chronicity of pain, other comorbidities, multiple failed attempts at pain relief (injection, sx, past PT)    PT Frequency  2x / week    PT Duration  8 weeks    PT Treatment/Interventions  ADLs/Self Care Home Management;Cryotherapy;Traction;Gait training;Therapeutic exercise;Patient/family education;Manual techniques;Passive range of motion;Dry needling;Taping;Neuromuscular re-education;Balance training;Stair training;Electrical Stimulation;Ultrasound;Aquatic Therapy;Moist Heat;Iontophoresis 4mg /ml Dexamethasone;Therapeutic activities    PT Home Exercise Plan  lower lumbar rotations, seated hamstring stretch, supine glute stretch    Consulted and Agree with Plan of Care  Patient       Patient will benefit from skilled therapeutic intervention in order to improve the following deficits and impairments:  Abnormal gait, Decreased balance, Decreased endurance, Difficulty walking, Decreased range of motion, Improper body  mechanics, Impaired tone, Decreased activity tolerance, Decreased strength, Increased fascial restricitons, Impaired flexibility, Postural dysfunction, Pain  Visit Diagnosis: Spinal stenosis of lumbar region with neurogenic claudication  Difficulty in walking, not elsewhere classified  Repeated falls     Problem List Patient Active Problem List   Diagnosis Date Noted  . Malignant neoplasm of right lung ([place]) 04/18/2016  . CAD (coronary artery disease) 04/01/2016  . Adenocarcinoma ([place]) 04/01/2016  . Skin cyst 12/26/2015  . GERD (gastroesophageal reflux disease) 06/16/2015  . Abscess of back 06/06/2015  . Vertigo 03/27/2015  . Carpal tunnel syndrome 10/28/2014  . Status post cholecystectomy 09/29/2014  . Disease of digestive tract 09/29/2014  . Cardiomyopathy ([place]) 09/26/2014  . Type 2 diabetes mellitus without complications ([place])   . Sleep apnea   . Spinal stenosis, lumbar region, with neurogenic claudication 06/07/2014  . Lumbar stenosis with neurogenic claudication 06/07/2014  . Abnormal gait 08/20/2012  . H/O total knee replacement 08/20/2012  . Arthritis of knee, degenerative 08/20/2012  . Pacemaker-St.Jude 08/03/2012  . Cardiac conduction disorder 06/19/2012  . Acid reflux 06/18/2012  . 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 Triggering of digit 03/25/2012  . Hyperlipidemia 12/23/2011  . Essential hypertension 03/23/2011  . Atrioventricular block, complete ([place]) 03/23/2011  . Complete atrioventricular block ([place]) 03/23/2011    Larae Grooms 09/10/2018, 3:58 PM  [place] MAIN [facility] SERVICES [address] [place], [place], [address] Phone: [phone]   Fax:  [phone]  Name: Howard [name] MRN: [id number] Date of Birth: 1931/02/04

## 2018-09-11 ENCOUNTER — Ambulatory Visit: Payer: Medicare Other | Admitting: Physical Therapy

## 2018-09-14 ENCOUNTER — Encounter: Payer: Medicare Other | Admitting: Physical Therapy

## 2018-09-14 ENCOUNTER — Other Ambulatory Visit: Payer: Self-pay | Admitting: Family Medicine

## 2018-09-14 DIAGNOSIS — K2100 Gastro-esophageal reflux disease with esophagitis, without bleeding: Secondary | ICD-10-CM

## 2018-09-14 MED ORDER — OMEPRAZOLE 40 MG PO CPDR: 40.0000 mg | DELAYED_RELEASE_CAPSULE | Freq: Every day | ORAL | 3 refills | Status: DC

## 2018-09-14 NOTE — Telephone Encounter (Signed)
[facility] faxed a refill request for the following medication. Thanks CC  omeprazole (PRILOSEC) 40 MG capsule

## 2018-09-15 ENCOUNTER — Ambulatory Visit: Payer: Medicare Other

## 2018-09-15 ENCOUNTER — Other Ambulatory Visit: Payer: Self-pay

## 2018-09-15 DIAGNOSIS — R296 Repeated falls: Secondary | ICD-10-CM | POA: Diagnosis not present

## 2018-09-15 DIAGNOSIS — R262 Difficulty in walking, not elsewhere classified: Secondary | ICD-10-CM

## 2018-09-15 DIAGNOSIS — M48062 Spinal stenosis, lumbar region with neurogenic claudication: Secondary | ICD-10-CM | POA: Diagnosis not present

## 2018-09-15 NOTE — Therapy (Signed)
[place] MAIN [facility] SERVICES [address] [place], [place], [address] Phone: [phone]   Fax:  [phone]  Physical Therapy Treatment  Patient Details  Name: Howard [name] MRN: [id number] Date of Birth: 04/03/1931 Referring Provider: [name]   Encounter Date: 09/15/2018  PT End of Session - 09/15/18 1203    Visit Number  5    Number of Visits  17    Date for PT Re-Evaluation  10/22/18    PT Start Time  1030    PT Stop Time  1115    PT Time Calculation (min)  45 min    Activity Tolerance  Patient tolerated treatment well    Behavior During Therapy  [facility] for tasks assessed/performed       Past Medical History:  Diagnosis Date  . Arthritis   . Bell palsy   . Cancer [facility])    prostate and skin  . Chronic combined systolic and diastolic CHF, NYHA class 1 ([place])    a. 07/2014 Echo: EF 35-40%, Gr 1 DD.  Marland Kitchen Complete heart block ([place])    a. 11/2010 s/p SJM 2210 Accent DC PPM, ser# [id number].  . Depression   . Diabetes mellitus without complication ([place])   . Fall 11-10-14  . GERD (gastroesophageal reflux disease)   . History of prostate cancer   . Hyperlipidemia   . Hypertension   . LBBB (left bundle branch block)   . Left-sided Bell's palsy   . Lung cancer ([place]) 2016  . NICM (nonischemic cardiomyopathy) ([place])    a. 07/2014 Echo: EF 35-40%, mid-apicalanteroseptal DK, Gr 1 DD, mild-mod dil LA.  . Non-obstructive CAD    a. 07/2014 Abnl MV;  b. 08/2014 Cath: LM nl, LAD 30p, RI 40p, LCX nl, OM1 40, RCA dominant 30p, 70d-->Med Rx.  Marland Kitchen Poor balance   . Presence of permanent cardiac pacemaker   . Sleep apnea    a. cpap  . Vertigo   . WPW (Wolff-Parkinson-White syndrome)    a. S/P RFCA 1991.    Past Surgical History:  Procedure Laterality Date  . APPLICATION OF WOUND VAC Left 06/07/2015   Procedure: APPLICATION OF WOUND VAC;  Surgeon: Robert Bellow, MD;  Location: [facility] ORS;  Service: General;  Laterality: Left;  left upper back  . BACK  SURGERY     2011  . CARDIAC CATHETERIZATION  08/26/2014   Single vessel obstructive CAD  . CARPAL TUNNEL RELEASE  04-04-15   [facility]  . CATARACT EXTRACTION  07-31-11 and 09-18-11  . Catheter ablation  1991   for WPW  . cervical fusion    . CHOLECYSTECTOMY  09-07-14  . HAND SURGERY     right 1993; left 2005  . HERNIA REPAIR  1955  . INSERT / REPLACE / REMOVE PACEMAKER    . JOINT REPLACEMENT Left 2013   knee  . JOINT REPLACEMENT Right 2004   knee  . KNEE SURGERY     left knee 1991 and 1992; right knee 1995  . LEFT HEART CATHETERIZATION WITH CORONARY ANGIOGRAM N/A 08/26/2014   Procedure: LEFT HEART CATHETERIZATION WITH CORONARY ANGIOGRAM;  Surgeon: [name], MD;  Location: [facility] CATH LAB;  Service: Cardiovascular;  Laterality: N/A;  . LUMBAR LAMINECTOMY/DECOMPRESSION MICRODISCECTOMY N/A 06/07/2014   Procedure: LUMBAR FOUR TO FIVE LUMBAR LAMINECTOMY/DECOMPRESSION MICRODISCECTOMY 1 LEVEL;  Surgeon: [name], MD;  Location: [facility] NEURO ORS;  Service: Neurosurgery;  Laterality: N/A;  . LUNG BIOPSY Right 2016   Dr Howard [name]  .  PACEMAKER INSERTION     PPM-- St Jude 11/30/10 by [name]  . PROSTATE SURGERY     cancer--1998, prostatectomy  . REPLACEMENT TOTAL KNEE     2004  . ruptured disc     1962 and 1998  . TRIGGER FINGER RELEASE  01-24-15  . WOUND DEBRIDEMENT Left 06/07/2015   Procedure: DEBRIDEMENT WOUND;  Surgeon: Robert Bellow, MD;  Location: [facility] ORS;  Service: General;  Laterality: Left;  left upper back    There were no vitals filed for this visit.  Subjective Assessment - 09/15/18 1158    Subjective  Pt reports feeling better through low back with home function/activity. Feels more aware of bracing back. Pt states LE numbness and feeling that "thigh muscles quit working" is worsening happen often throughout the day. Pt denies giving way/falling, but states when "I feel it coming on, I just sit and don't do anything because I can't"    Pertinent History  Patient is a 84 year old male reporting  iwth LBP with LE weakness. Patient has hx of spinal stenosis, and and is currently receiving injections for LE weakness. Patient reports he has some L sided LBP in mornings, but his cheif complaint is his bilat LE numbess and weakness that is occuring after he walks for a period of time. Patient reports [place] and cannot walk up and down his driveway without the legs feeling like they "are going to give out". Pt denies N/V, B&B changes, unexplained weight fluctuation, saddle paresthesia, fever, night sweats, or unrelenting night pain at this time.      Enters/exits via ramp  Ambulation, blue dumbbells  Fwd 4 L   Side 2 L   Side with squat 2 L  Bench  LE/core strength   Up and outs, B 20x ea   Bike, 3 min   Scissor, 3 min   Flutter 3 min  Core with UE work  Scientist, physiological   Sh abd/add, 20x   Sh flex/ext, 20x   Sh horiz abd/add, 20x  Active stretching, B 3 x each  Ham/quad and hip flexor  LB, L/R center                         PT Education - 09/15/18 1201    Education Details  continued on core stabilization. Proper posture with walking as well asn LE mechanics at knee. Form with all exercises. Side stepping with squats. Up and outs at B hips. Active stretching    Person(s) Educated  Patient    Methods  Explanation;Demonstration;Tactile cues;Verbal cues    Comprehension  Verbalized understanding;Returned demonstration;Verbal cues required;Tactile cues required;Need further instruction       PT Short Term Goals - 08/28/18 0858      PT SHORT TERM GOAL #1   Title  Pt will be independent with HEP in order to improve strength, flexibility, and balance in order to decrease fall risk and improve function at home and work.    Time  4    Period  Weeks    Status  New        PT Long Term Goals - 08/28/18 0626      PT LONG TERM GOAL #1   Title  Patient will report ambulation over 10 min without LE numbess/weakness to be able to complete ADLs    Baseline   08/28/18: Patient reports ambulation over 60mins before LE sx prevent further ambulation    Time  8    Period  Weeks    Status  New      PT LONG TERM GOAL #2   Title  Pt will decrease 5TSTS by at least 3 seconds in order to demonstrate clinically significant improvement in LE strength    Baseline  08/28/18: 15sec    Time  8    Period  Weeks    Status  New      PT LONG TERM GOAL #3   Title  Patient will be able to ambulate uphill without LE sx in order to retrieve his morning paper    Baseline  08/28/18: 92ft up (approx 20d grade); after walking uphill pt immediately has to sit d/t BLE feeling like they are going to "give out"    Time  8    Period  Weeks    Status  New            Plan - 09/15/18 1204    Clinical Impression Statement  Pt tolerated session well; requires verbal and tactile cues throughout for proper technique, postures and core stabilization. Pt does not experience any pain or numbess in LB or BLEs. Continue to progress strength and flexibility    Rehab Potential  Fair    Clinical Impairments Affecting Rehab Potential  (+) social support, motivation (-) age, chronicity of pain, other comorbidities, multiple failed attempts at pain relief (injection, sx, past PT)    PT Frequency  2x / week    PT Duration  8 weeks    PT Treatment/Interventions  ADLs/Self Care Home Management;Cryotherapy;Traction;Gait training;Therapeutic exercise;Patient/family education;Manual techniques;Passive range of motion;Dry needling;Taping;Neuromuscular re-education;Balance training;Stair training;Electrical Stimulation;Ultrasound;Aquatic Therapy;Moist Heat;Iontophoresis 4mg /ml Dexamethasone;Therapeutic activities    PT Home Exercise Plan  lower lumbar rotations, seated hamstring stretch, supine glute stretch    Consulted and Agree with Plan of Care  Patient       Patient will benefit from skilled therapeutic intervention in order to improve the following deficits and impairments:  Abnormal gait,  Decreased balance, Decreased endurance, Difficulty walking, Decreased range of motion, Improper body mechanics, Impaired tone, Decreased activity tolerance, Decreased strength, Increased fascial restricitons, Impaired flexibility, Postural dysfunction, Pain  Visit Diagnosis: Spinal stenosis of lumbar region with neurogenic claudication  Difficulty in walking, not elsewhere classified  Repeated falls     Problem List Patient Active Problem List   Diagnosis Date Noted  . Malignant neoplasm of right lung ([place]) 04/18/2016  . CAD (coronary artery disease) 04/01/2016  . Adenocarcinoma ([place]) 04/01/2016  . Skin cyst 12/26/2015  . GERD (gastroesophageal reflux disease) 06/16/2015  . Abscess of back 06/06/2015  . Vertigo 03/27/2015  . Carpal tunnel syndrome 10/28/2014  . Status post cholecystectomy 09/29/2014  . Disease of digestive tract 09/29/2014  . Cardiomyopathy ([place]) 09/26/2014  . Type 2 diabetes mellitus without complications ([place])   . Sleep apnea   . Spinal stenosis, lumbar region, with neurogenic claudication 06/07/2014  . Lumbar stenosis with neurogenic claudication 06/07/2014  . Abnormal gait 08/20/2012  . H/O total knee replacement 08/20/2012  . Arthritis of knee, degenerative 08/20/2012  . Pacemaker-St.Jude 08/03/2012  . Cardiac conduction disorder 06/19/2012  . Acid reflux 06/18/2012  . 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 Triggering of digit 03/25/2012  . Hyperlipidemia 12/23/2011  . Essential hypertension 03/23/2011  . Atrioventricular block, complete ([place]) 03/23/2011  . Complete atrioventricular block ([place]) 03/23/2011    Merline Perkin E Kamali Nephew 09/15/2018, 12:07 PM  [place] MAIN [facility] SERVICES [place], [place],  [place] Phone: [phone]   Fax:  [phone]  Name: AARYAN ESSMAN MRN: [id number] Date of Birth: [birth year]

## 2018-09-16 ENCOUNTER — Encounter: Payer: Medicare Other | Admitting: Physical Therapy

## 2018-09-17 ENCOUNTER — Ambulatory Visit: Payer: Medicare Other | Admitting: Physical Therapy

## 2018-09-17 ENCOUNTER — Encounter: Payer: Self-pay | Admitting: Physical Therapy

## 2018-09-17 DIAGNOSIS — R296 Repeated falls: Secondary | ICD-10-CM | POA: Diagnosis not present

## 2018-09-17 DIAGNOSIS — R262 Difficulty in walking, not elsewhere classified: Secondary | ICD-10-CM | POA: Diagnosis not present

## 2018-09-17 DIAGNOSIS — M48062 Spinal stenosis, lumbar region with neurogenic claudication: Secondary | ICD-10-CM

## 2018-09-17 NOTE — Therapy (Signed)
[place] [facility] [address] [place], [address] Phone: [phone]   Fax:  [phone]  Physical Therapy Treatment  Patient Details  Name: Howard [name] MRN: [id number] Date of Birth: November 22, 1931 Referring Provider (PT): Gollan   Encounter Date: 09/17/2018  PT End of Session - 09/17/18 1127    Visit Number  6    Number of Visits  17    Date for PT Re-Evaluation  10/22/18    PT Start Time  1115    PT Stop Time  1200    PT Time Calculation (min)  45 min    Activity Tolerance  Patient tolerated treatment well    Behavior During Therapy  [facility] for tasks assessed/performed       Past Medical History:  Diagnosis Date  . Arthritis   . Bell palsy   . Cancer [facility])    prostate and skin  . Chronic combined systolic and diastolic CHF, NYHA class 1 ([place])    a. 07/2014 Echo: EF 35-40%, Gr 1 DD.  Marland Kitchen Complete heart block ([place])    a. 11/2010 s/p SJM 2210 Accent DC PPM, ser# [id number].  . Depression   . Diabetes mellitus without complication ([place])   . Fall 11-10-14  . GERD (gastroesophageal reflux disease)   . History of prostate cancer   . Hyperlipidemia   . Hypertension   . LBBB (left bundle branch block)   . Left-sided Bell's palsy   . Lung cancer ([place]) 2016  . NICM (nonischemic cardiomyopathy) ([place])    a. 07/2014 Echo: EF 35-40%, mid-apicalanteroseptal DK, Gr 1 DD, mild-mod dil LA.  . Non-obstructive CAD    a. 07/2014 Abnl MV;  b. 08/2014 Cath: LM nl, LAD 30p, RI 40p, LCX nl, OM1 40, RCA dominant 30p, 70d-->Med Rx.  Marland Kitchen Poor balance   . Presence of permanent cardiac pacemaker   . Sleep apnea    a. cpap  . Vertigo   . WPW (Wolff-Parkinson-White syndrome)    a. S/P RFCA 1991.    Past Surgical History:  Procedure Laterality Date  . APPLICATION OF WOUND VAC Left 06/07/2015   Procedure: APPLICATION OF WOUND VAC;  Surgeon: Robert Bellow, MD;  Location: [facility] ORS;  Service: General;  Laterality: Left;  left upper back   . BACK SURGERY     2011  . CARDIAC CATHETERIZATION  08/26/2014   Single vessel obstructive CAD  . CARPAL TUNNEL RELEASE  04-04-15   [facility]  . CATARACT EXTRACTION  07-31-11 and 09-18-11  . Catheter ablation  1991   for WPW  . cervical fusion    . CHOLECYSTECTOMY  09-07-14  . HAND SURGERY     right 1993; left 2005  . HERNIA REPAIR  1955  . INSERT / REPLACE / REMOVE PACEMAKER    . JOINT REPLACEMENT Left 2013   knee  . JOINT REPLACEMENT Right 2004   knee  . KNEE SURGERY     left knee 1991 and 1992; right knee 1995  . LEFT HEART CATHETERIZATION WITH CORONARY ANGIOGRAM N/A 08/26/2014   Procedure: LEFT HEART CATHETERIZATION WITH CORONARY ANGIOGRAM;  Surgeon: [name], MD;  Location: [facility] CATH LAB;  Service: Cardiovascular;  Laterality: N/A;  . LUMBAR LAMINECTOMY/DECOMPRESSION MICRODISCECTOMY N/A 06/07/2014   Procedure: LUMBAR FOUR TO FIVE LUMBAR LAMINECTOMY/DECOMPRESSION MICRODISCECTOMY 1 LEVEL;  Surgeon: [name], MD;  Location: [place] NEURO ORS;  Service: Neurosurgery;  Laterality: N/A;  . LUNG BIOPSY Right 2016   Dr  [place]     PPM-- St Jude 11/30/10 by [name]  . PROSTATE SURGERY     cancer--1998, prostatectomy  . REPLACEMENT TOTAL KNEE     2004  . ruptured disc     1962 and 1998  . TRIGGER FINGER RELEASE  01-24-15  . WOUND DEBRIDEMENT Left 06/07/2015   Procedure: DEBRIDEMENT WOUND;  Surgeon: Robert Bellow, MD;  Location: [facility] ORS;  Service: General;  Laterality: Left;  left upper back    There were no vitals filed for this visit.  Subjective Assessment - 09/17/18 1123    Subjective  Pt reports his LLE is "giving out on him" more over this past week. Patient reports he is enjoying aquatic therapy and he thinks this is helping him not "have as much back pain". Patient reports he is having minimal pain today, with c/o LLE getting "numb and weak". Patient reports compliance with HEP with no questions or concerns.     Pertinent History  Patient is a 84 year old male  reporting iwth LBP with LE weakness. Patient has hx of spinal stenosis, and and is currently receiving injections for LE weakness. Patient reports he has some L sided LBP in mornings, but his cheif complaint is his bilat LE numbess and weakness that is occuring after he walks for a period of time. Patient reports [place] and cannot walk up and down his driveway without the legs feeling like they "are going to give out". Pt denies N/V, B&B changes, unexplained weight fluctuation, saddle paresthesia, fever, night sweats, or unrelenting night pain at this time.    How long can you sit comfortably?  unlimited    How long can you stand comfortably?  50mins    How long can you walk comfortably?  2 min    Patient Stated Goals  Walk for extended period of time, walk up/down the driveway    Pain Onset  Other (comment)         Ther-Ex - 69min on recumbant bike with patient leaning forward, for safe aerobic/muscular endurance activity - Double knee to chest with gentle rock side to side x20  Gait Training - Multiple trials of gait training on level surface with cane in R hand and over small grade ramp into clinic. Patient is able to complete ambulation with proper AD use following PT demo and cuing. PT educated patient on proper height of cane, and purpose to offload LLE to prevent buckling sensation. Patient verbalizes and demonstrates understanding of proper AD use and is able to walk with similar gait speed with AD by the end of trials   Manual - STM with trigger point release to bilat lumbar paraspinals and L glute max/min/piriformis                          PT Education - 09/17/18 1126    Education Details  Exercise form    Person(s) Educated  Patient    Methods  Explanation;Verbal cues    Comprehension  Verbal cues required;Verbalized understanding       PT Short Term Goals - 08/28/18 0858      PT SHORT TERM GOAL #1   Title  Pt will be independent with  HEP in order to improve strength, flexibility, and balance in order to decrease fall risk and improve function at home and work.    Time  4    Period  Weeks  Status  New        PT Long Term Goals - 09/17/18 1314      PT LONG TERM GOAL #1   Title  Patient will report ambulation over 10 min without LE numbess/weakness to be able to complete ADLs    Baseline  08/28/18: Patient reports ambulation over 65mins before LE sx prevent further ambulation    Time  8    Period  Weeks    Status  New      PT LONG TERM GOAL #2   Title  Pt will decrease 5TSTS by at least 3 seconds in order to demonstrate clinically significant improvement in LE strength    Baseline  08/28/18: 15sec    Time  8    Period  Weeks      PT LONG TERM GOAL #3   Title  Patient will be able to ambulate uphill without LE sx in order to retrieve his morning paper    Baseline  08/28/18: 72ft up (approx 20d grade); after walking uphill pt immediately has to sit d/t BLE feeling like they are going to "give out"    Time  8    Period  Weeks    Status  New      PT LONG TERM GOAL #4   Title  Pt will increase 10MWT by at least 0.13 m/s in order to demonstrate clinically significant improvement in community ambulation.     Baseline  09/17/18 1.1m/s    Time  8    Period  Weeks    Status  New            Plan - 09/17/18 1343    Clinical Impression Statement  PT spent time educating patient on AD use for safety as patient reports he has a cane he does "not like to use". Patient is able to demonstrate correct cane use by end of session, and reports he is going to attempt this over the weekend and report to PT next week if he feels as though this increases activity tolerance. PT continued to utilize pain free therex and manual techniques to relieve soft tissue tension.     Rehab Potential  Fair    Clinical Impairments Affecting Rehab Potential  (+) social support, motivation (-) age, chronicity of pain, other comorbidities, multiple  failed attempts at pain relief (injection, sx, past PT)    PT Frequency  2x / week    PT Duration  8 weeks    PT Treatment/Interventions  ADLs/Self Care Home Management;Cryotherapy;Traction;Gait training;Therapeutic exercise;Patient/family education;Manual techniques;Passive range of motion;Dry needling;Taping;Neuromuscular re-education;Balance training;Stair training;Electrical Stimulation;Ultrasound;Aquatic Therapy;Moist Heat;Iontophoresis 4mg /ml Dexamethasone;Therapeutic activities    PT Next Visit Plan  AD assesssment;, techniques for muscle tension relief    PT Home Exercise Plan  lower lumbar rotations, seated hamstring stretch, supine glute stretch    Consulted and Agree with Plan of Care  Patient       Patient will benefit from skilled therapeutic intervention in order to improve the following deficits and impairments:  Abnormal gait, Decreased balance, Decreased endurance, Difficulty walking, Decreased range of motion, Improper body mechanics, Impaired tone, Decreased activity tolerance, Decreased strength, Increased fascial restricitons, Impaired flexibility, Postural dysfunction, Pain  Visit Diagnosis: Spinal stenosis of lumbar region with neurogenic claudication     Problem List Patient Active Problem List   Diagnosis Date Noted  . Malignant neoplasm of right lung ([place]) 04/18/2016  . CAD (coronary artery disease) 04/01/2016  . Adenocarcinoma ([place]) 04/01/2016  . Skin cyst  12/26/2015  . GERD (gastroesophageal reflux disease) 06/16/2015  . Abscess of back 06/06/2015  . Vertigo 03/27/2015  . Carpal tunnel syndrome 10/28/2014  . Status post cholecystectomy 09/29/2014  . Disease of digestive tract 09/29/2014  . Cardiomyopathy ([place]) 09/26/2014  . Type 2 diabetes mellitus without complications ([place])   . Sleep apnea   . Spinal stenosis, lumbar region, with neurogenic claudication 06/07/2014  . Lumbar stenosis with neurogenic claudication 06/07/2014  . Abnormal gait 08/20/2012   . H/O total knee replacement 08/20/2012  . Arthritis of knee, degenerative 08/20/2012  . Pacemaker-St.Jude 08/03/2012  . Cardiac conduction disorder 06/19/2012  . Acid reflux 06/18/2012  . 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 Triggering of digit 03/25/2012  . Hyperlipidemia 12/23/2011  . Essential hypertension 03/23/2011  . Atrioventricular block, complete ([place]) 03/23/2011  . Complete atrioventricular block ([place]) 03/23/2011   Shelton Silvas PT, DPT Shelton Silvas 09/17/2018, 1:46 PM  [place] [facility] [address] [place], [address] Phone: [phone]   Fax:  [phone]  Name: Howard [name] MRN: [id number] Date of Birth: Oct 14, 1931

## 2018-09-22 ENCOUNTER — Ambulatory Visit: Payer: Medicare Other | Attending: Cardiovascular Disease | Admitting: Physical Therapy

## 2018-09-22 ENCOUNTER — Encounter: Payer: Self-pay | Admitting: Physical Therapy

## 2018-09-22 DIAGNOSIS — R262 Difficulty in walking, not elsewhere classified: Secondary | ICD-10-CM | POA: Insufficient documentation

## 2018-09-22 DIAGNOSIS — R296 Repeated falls: Secondary | ICD-10-CM | POA: Insufficient documentation

## 2018-09-22 DIAGNOSIS — R42 Dizziness and giddiness: Secondary | ICD-10-CM | POA: Insufficient documentation

## 2018-09-22 DIAGNOSIS — M48062 Spinal stenosis, lumbar region with neurogenic claudication: Secondary | ICD-10-CM

## 2018-09-22 NOTE — Therapy (Signed)
[facility] [address] [place], [address] Phone: [phone]   Fax:  [phone]  Physical Therapy Treatment  Patient Details  Name: Howard [name] MRN: [id number] Date of Birth: 1931/01/15 Referring Provider (PT): Gollan   Encounter Date: 09/22/2018  PT End of Session - 09/22/18 1440    Visit Number  7    Number of Visits  17    Date for PT Re-Evaluation  10/22/18    PT Start Time  0230    PT Stop Time  0315    PT Time Calculation (min)  45 min    Activity Tolerance  Patient tolerated treatment well    Behavior During Therapy  [facility] for tasks assessed/performed       Past Medical History:  Diagnosis Date  . Arthritis   . Bell palsy   . Cancer [facility])    prostate and skin  . Chronic combined systolic and diastolic CHF, NYHA class 1 ([place])    a. 07/2014 Echo: EF 35-40%, Gr 1 DD.  Marland Kitchen Complete heart block ([place])    a. 11/2010 s/p SJM 2210 Accent DC PPM, ser# [id number].  . Depression   . Diabetes mellitus without complication ([place])   . Fall 11-10-14  . GERD (gastroesophageal reflux disease)   . History of prostate cancer   . Hyperlipidemia   . Hypertension   . LBBB (left bundle branch block)   . Left-sided Bell's palsy   . Lung cancer ([place]) 2016  . NICM (nonischemic cardiomyopathy) ([place])    a. 07/2014 Echo: EF 35-40%, mid-apicalanteroseptal DK, Gr 1 DD, mild-mod dil LA.  . Non-obstructive CAD    a. 07/2014 Abnl MV;  b. 08/2014 Cath: LM nl, LAD 30p, RI 40p, LCX nl, OM1 40, RCA dominant 30p, 70d-->Med Rx.  Marland Kitchen Poor balance   . Presence of permanent cardiac pacemaker   . Sleep apnea    a. cpap  . Vertigo   . WPW (Wolff-Parkinson-White syndrome)    a. S/P RFCA 1991.    Past Surgical History:  Procedure Laterality Date  . APPLICATION OF WOUND VAC Left 06/07/2015   Procedure: APPLICATION OF WOUND VAC;  Surgeon: Robert Bellow, MD;  Location: [facility] ORS;  Service: General;  Laterality: Left;  left upper back   . BACK SURGERY     2011  . CARDIAC CATHETERIZATION  08/26/2014   Single vessel obstructive CAD  . CARPAL TUNNEL RELEASE  04-04-15   [facility]  . CATARACT EXTRACTION  07-31-11 and 09-18-11  . Catheter ablation  1991   for WPW  . cervical fusion    . CHOLECYSTECTOMY  09-07-14  . HAND SURGERY     right 1993; left 2005  . HERNIA REPAIR  1955  . INSERT / REPLACE / REMOVE PACEMAKER    . JOINT REPLACEMENT Left 2013   knee  . JOINT REPLACEMENT Right 2004   knee  . KNEE SURGERY     left knee 1991 and 1992; right knee 1995  . LEFT HEART CATHETERIZATION WITH CORONARY ANGIOGRAM N/A 08/26/2014   Procedure: LEFT HEART CATHETERIZATION WITH CORONARY ANGIOGRAM;  Surgeon: [name], MD;  Location[facility] CATH LAB;  Service: Cardiovascular;  Laterality: N/A;  . LUMBAR LAMINECTOMY/DECOMPRESSION MICRODISCECTOMY N/A 06/07/2014   Procedure: LUMBAR FOUR TO FIVE LUMBAR LAMINECTOMY/DECOMPRESSION MICRODISCECTOMY 1 LEVEL;  Surgeon: [name], MD;  Location: [facility] NEURO ORS;  Service: Neurosurgery;  Laterality: N/A;  . LUNG BIOPSY Right 2016   Dr  [place]     PPM-- St Jude 11/30/10 by [name]  . PROSTATE SURGERY     cancer--1998, prostatectomy  . REPLACEMENT TOTAL KNEE     2004  . ruptured disc     1962 and 1998  . TRIGGER FINGER RELEASE  01-24-15  . WOUND DEBRIDEMENT Left 06/07/2015   Procedure: DEBRIDEMENT WOUND;  Surgeon: Robert Bellow, MD;  Location: [facility] ORS;  Service: General;  Laterality: Left;  left upper back    There were no vitals filed for this visit.  Subjective Assessment - 09/22/18 1436    Subjective  Patient ambulates into clinic with Dr [facility] today, which he reports is helping his "knee not buckle as much when he is walking". Patient reports compliance with HEP with no questions or concerns.     Pertinent History  Patient is a 84 year old male reporting iwth LBP with LE weakness. Patient has hx of spinal stenosis, and and is currently receiving injections for LE weakness. Patient reports  he has some L sided LBP in mornings, but his cheif complaint is his bilat LE numbess and weakness that is occuring after he walks for a period of time. Patient [place] and cannot walk up and down his driveway without the legs feeling like they "are going to give out". Pt denies N/V, B&B changes, unexplained weight fluctuation, saddle paresthesia, fever, night sweats, or unrelenting night pain at this time.    How long can you sit comfortably?  unlimited    How long can you stand comfortably?  30mins    How long can you walk comfortably?  2 min    Patient Stated Goals  Walk for extended period of time, walk up/down the driveway    Pain Onset  Other (comment)         Ther-Ex - 50min on recumbant bike with patient leaning forward, for safe aerobic/muscular endurance activity - Bridge x10; pt reports "hamstring cramp" at the end. Glute set completed x10 followed by bridge x10 for 2 sets with no reported cramp and good carry over of proper form - Posterior pelvic tilt with demo and max cuing initially for proper form; with good carry over following 3x 10 with 2sec hold - Hooklying marching from 4in step 2x 6/8(each LE) with demo and max cuing to maintain core contraction with good carry over  Gait Training - Multiple trials of gait training with cane patient has brought from home. PT adjusted cane to proper height (cane is too short, causing patient to lean laterally to R) PT educated patient on proper height so he can adjust at home if he needs to. Patient reports he is able to walk further with cane around the time. Patietn is unable to complete 6MWT (takes a seat after 34min for a a minute and for 1.5 min following). After this attempt, PT initiated 2MWT where patient is able to ambulate 216ft over 2 mins before needing a rest break.                          PT Education - 09/22/18 1439    Education Details  Exercise form; gait training review with cane     Person(s) Educated  Patient    Methods  Explanation;Verbal cues    Comprehension  Verbalized understanding;Verbal cues required       PT Short Term Goals - 08/28/18 5366      PT  SHORT TERM GOAL #1   Title  Pt will be independent with HEP in order to improve strength, flexibility, and balance in order to decrease fall risk and improve function at home and work.    Time  4    Period  Weeks    Status  New        PT Long Term Goals - 09/22/18 1501      PT LONG TERM GOAL #1   Title  Patient will report ambulation over 10 min without LE numbess/weakness to be able to complete ADLs    Baseline  09/22/18 338ft in 85min;    Time  8    Period  Weeks    Status  New      PT LONG TERM GOAL #2   Title  Pt will decrease 5TSTS by at least 3 seconds in order to demonstrate clinically significant improvement in LE strength    Baseline  08/28/18: 15sec    Time  8    Period  Weeks    Status  New      PT LONG TERM GOAL #3   Title  Patient will be able to ambulate uphill without LE sx in order to retrieve his morning paper    Baseline  08/28/18: 32ft up (approx 20d grade); after walking uphill pt immediately has to sit d/t BLE feeling like they are going to "give out"    Time  8    Period  Weeks    Status  New      PT LONG TERM GOAL #4   Title  Pt will increase 10MWT by at least 0.13 m/s in order to demonstrate clinically significant improvement in community ambulation.     Baseline  09/17/18 1.27m/s    Time  8    Period  Weeks    Status  New            Plan - 09/22/18 1529    Clinical Impression Statement  Patient progressed ambulation with AD with min cuing for proper use. Patient is able to ambulate over 2 mins which is an improvement from evaluation (patient was only able to walk from waiting room into the clinic). Patient report he feels as though his ambulation has improved since using cane. Patient is able to complete therex progression for hip and core strengthening, which  patient is able to complete with accuracy following PT demo and cuing.     Rehab Potential  Fair    Clinical Impairments Affecting Rehab Potential  (+) social support, motivation (-) age, chronicity of pain, other comorbidities, multiple failed attempts at pain relief (injection, sx, past PT)    PT Frequency  2x / week    PT Duration  8 weeks    PT Treatment/Interventions  ADLs/Self Care Home Management;Cryotherapy;Traction;Gait training;Therapeutic exercise;Patient/family education;Manual techniques;Passive range of motion;Dry needling;Taping;Neuromuscular re-education;Balance training;Stair training;Electrical Stimulation;Ultrasound;Aquatic Therapy;Moist Heat;Iontophoresis 4mg /ml Dexamethasone;Therapeutic activities    PT Next Visit Plan  hip/core strengthening, aerobic progression    PT Home Exercise Plan  lower lumbar rotations, seated hamstring stretch, supine glute stretch    Consulted and Agree with Plan of Care  Patient       Patient will benefit from skilled therapeutic intervention in order to improve the following deficits and impairments:  Abnormal gait, Decreased balance, Decreased endurance, Difficulty walking, Decreased range of motion, Improper body mechanics, Impaired tone, Decreased activity tolerance, Decreased strength, Increased fascial restricitons, Impaired flexibility, Postural dysfunction, Pain  Visit Diagnosis: Spinal stenosis of lumbar  region with neurogenic claudication     Problem List Patient Active Problem List   Diagnosis Date Noted  . Malignant neoplasm of right lung ([place]) 04/18/2016  . CAD (coronary artery disease) 04/01/2016  . Adenocarcinoma ([place]) 04/01/2016  . Skin cyst 12/26/2015  . GERD (gastroesophageal reflux disease) 06/16/2015  . Abscess of back 06/06/2015  . Vertigo 03/27/2015  . Carpal tunnel syndrome 10/28/2014  . Status post cholecystectomy 09/29/2014  . Disease of digestive tract 09/29/2014  . Cardiomyopathy ([place]) 09/26/2014  . Type 2  diabetes mellitus without complications ([place])   . Sleep apnea   . Spinal stenosis, lumbar region, with neurogenic claudication 06/07/2014  . Lumbar stenosis with neurogenic claudication 06/07/2014  . Abnormal gait 08/20/2012  . H/O total knee replacement 08/20/2012  . Arthritis of knee, degenerative 08/20/2012  . Pacemaker-St.Jude 08/03/2012  . Cardiac conduction disorder 06/19/2012  . Acid reflux 06/18/2012  . 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 Triggering of digit 03/25/2012  . Hyperlipidemia 12/23/2011  . Essential hypertension 03/23/2011  . Atrioventricular block, complete ([place]) 03/23/2011  . Complete atrioventricular block ([place]) 03/23/2011   Shelton Silvas PT, DPT Shelton Silvas 09/22/2018, 3:50 PM  [facility] [address], [place], [address] Phone: [phone]   Fax:  [phone]  Name: Howard [name] MRN: [id number] Date of Birth: April 16, 1931

## 2018-09-24 ENCOUNTER — Other Ambulatory Visit: Payer: Self-pay

## 2018-09-24 ENCOUNTER — Ambulatory Visit: Payer: Medicare Other

## 2018-09-24 ENCOUNTER — Encounter: Payer: Medicare Other | Admitting: Physical Therapy

## 2018-09-24 DIAGNOSIS — R296 Repeated falls: Secondary | ICD-10-CM

## 2018-09-24 DIAGNOSIS — R42 Dizziness and giddiness: Secondary | ICD-10-CM | POA: Diagnosis not present

## 2018-09-24 DIAGNOSIS — R262 Difficulty in walking, not elsewhere classified: Secondary | ICD-10-CM | POA: Diagnosis not present

## 2018-09-24 DIAGNOSIS — M48062 Spinal stenosis, lumbar region with neurogenic claudication: Secondary | ICD-10-CM

## 2018-09-24 NOTE — Therapy (Signed)
[place] [facility] SERVICES [address] [place], [place], [address] Phone: [phone]   Fax:  [phone]  Physical Therapy Treatment  Patient Details  Name: Howard [name] MRN: [id number] Date of Birth: Jun 14, 1931 Referring Provider (PT): Gollan   Encounter Date: 09/24/2018  PT End of Session - 09/24/18 1421    Visit Number  8    Number of Visits  17    Date for PT Re-Evaluation  10/22/18    PT Start Time  1320    PT Stop Time  1410    PT Time Calculation (min)  50 min    Activity Tolerance  Patient tolerated treatment well       Past Medical History:  Diagnosis Date  . Arthritis   . Bell palsy   . Cancer [facility])    prostate and skin  . Chronic combined systolic and diastolic CHF, NYHA class 1 ([place])    a. 07/2014 Echo: EF 35-40%, Gr 1 DD.  Marland Kitchen Complete heart block ([place])    a. 11/2010 s/p SJM 2210 Accent DC PPM, ser# [id number].  . Depression   . Diabetes mellitus without complication ([place])   . Fall 11-10-14  . GERD (gastroesophageal reflux disease)   . History of prostate cancer   . Hyperlipidemia   . Hypertension   . LBBB (left bundle branch block)   . Left-sided Bell's palsy   . Lung cancer ([place]) 2016  . NICM (nonischemic cardiomyopathy) ([place])    a. 07/2014 Echo: EF 35-40%, mid-apicalanteroseptal DK, Gr 1 DD, mild-mod dil LA.  . Non-obstructive CAD    a. 07/2014 Abnl MV;  b. 08/2014 Cath: LM nl, LAD 30p, RI 40p, LCX nl, OM1 40, RCA dominant 30p, 70d-->Med Rx.  Marland Kitchen Poor balance   . Presence of permanent cardiac pacemaker   . Sleep apnea    a. cpap  . Vertigo   . WPW (Wolff-Parkinson-White syndrome)    a. S/P RFCA 1991.    Past Surgical History:  Procedure Laterality Date  . APPLICATION OF WOUND VAC Left 06/07/2015   Procedure: APPLICATION OF WOUND VAC;  Surgeon: Robert Bellow, MD;  Location: [facility] ORS;  Service: General;  Laterality: Left;  left upper back  . BACK SURGERY     2011  . CARDIAC CATHETERIZATION  08/26/2014   Single vessel obstructive CAD  . CARPAL TUNNEL RELEASE  04-04-15   [facility]  . CATARACT EXTRACTION  07-31-11 and 09-18-11  . Catheter ablation  1991   for WPW  . cervical fusion    . CHOLECYSTECTOMY  09-07-14  . HAND SURGERY     right 1993; left 2005  . HERNIA REPAIR  1955  . INSERT / REPLACE / REMOVE PACEMAKER    . JOINT REPLACEMENT Left 2013   knee  . JOINT REPLACEMENT Right 2004   knee  . KNEE SURGERY     left knee 1991 and 1992; right knee 1995  . LEFT HEART CATHETERIZATION WITH CORONARY ANGIOGRAM N/A 08/26/2014   Procedure: LEFT HEART CATHETERIZATION WITH CORONARY ANGIOGRAM;  Surgeon: [name], MD;  Location: [facility] CATH LAB;  Service: Cardiovascular;  Laterality: N/A;  . LUMBAR LAMINECTOMY/DECOMPRESSION MICRODISCECTOMY N/A 06/07/2014   Procedure: LUMBAR FOUR TO FIVE LUMBAR LAMINECTOMY/DECOMPRESSION MICRODISCECTOMY 1 LEVEL;  Surgeon: [name], MD;  Location: [place] NEURO ORS;  Service: Neurosurgery;  Laterality: N/A;  . LUNG BIOPSY Right 2016   Dr [name]  . PACEMAKER INSERTION     PPM-- St Jude 11/30/10  by [facility]     cancer--1998, prostatectomy  . REPLACEMENT TOTAL KNEE     2004  . ruptured disc     1962 and 1998  . TRIGGER FINGER RELEASE  01-24-15  . WOUND DEBRIDEMENT Left 06/07/2015   Procedure: DEBRIDEMENT WOUND;  Surgeon: Robert Bellow, MD;  Location: [facility] ORS;  Service: General;  Laterality: Left;  left upper back    There were no vitals filed for this visit.  Subjective Assessment - 09/24/18 1417    Subjective  Pt reports no problems post last session. Denies pain currently. Continues with complaints of L > R quad/knees "not working". Pt reports cane use which is helping with decreasing knee buckling.     Pertinent History  Patient is a 84 year old male reporting iwth LBP with LE weakness. Patient has hx of spinal stenosis, and and is currently receiving injections for LE weakness. Patient reports he has some L sided LBP in mornings, but his cheif complaint is  his bilat LE numbess and weakness that is occuring after he walks for a period of time. Patient reports [place] and cannot walk up and down his driveway without the legs feeling like they "are going to give out". Pt denies N/V, B&B changes, unexplained weight fluctuation, saddle paresthesia, fever, night sweats, or unrelenting night pain at this time.      Enters/exits via ramp   Ambulation  fwd 4 L   side 4 L   Core/LE  squats, 25x  SL standing fwd bike, B 20x ea  Noodle hang, 5 min  Noodle, semi supine  Hip abd/add  Hip flex/ext  Supine lateral trunk sway, passive 3 min  Light resisted ambulation  [place]   Fwd 2 L   Side 2 L    Bkwd with hands as stabiity  Core with UE  Red dumbbells   Triceps press downs   Sh abd/add   Sh flex/ext   Sh horiz abd/add  Stretching  LB, R, center, L 3x ea  Hams/quads, B 3x ea  Stand trunk/hip flexor, B 3 x ea                          PT Education - 09/24/18 1419    Education Details  Continued on times for slow movement in the water to avoid resist. Posture, extending gait stride/knee ext during ambulation. Core stabilization. Stretching; added stand trunk/hip flexor stretch    Person(s) Educated  Patient    Methods  Explanation;Demonstration    Comprehension  Verbalized understanding;Returned demonstration;Verbal cues required;Tactile cues required;Need further instruction       PT Short Term Goals - 08/28/18 0858      PT SHORT TERM GOAL #1   Title  Pt will be independent with HEP in order to improve strength, flexibility, and balance in order to decrease fall risk and improve function at home and work.    Time  4    Period  Weeks    Status  New        PT Long Term Goals - 09/22/18 1501      PT LONG TERM GOAL #1   Title  Patient will report ambulation over 10 min without LE numbess/weakness to be able to complete ADLs    Baseline  09/22/18 366ft in 77min;    Time  8    Period  Weeks     Status  New  PT LONG TERM GOAL #2   Title  Pt will decrease 5TSTS by at least 3 seconds in order to demonstrate clinically significant improvement in LE strength    Baseline  08/28/18: 15sec    Time  8    Period  Weeks    Status  New      PT LONG TERM GOAL #3   Title  Patient will be able to ambulate uphill without LE sx in order to retrieve his morning paper    Baseline  08/28/18: 74ft up (approx 20d grade); after walking uphill pt immediately has to sit d/t BLE feeling like they are going to "give out"    Time  8    Period  Weeks    Status  New      PT LONG TERM GOAL #4   Title  Pt will increase 10MWT by at least 0.13 m/s in order to demonstrate clinically significant improvement in community ambulation.     Baseline  09/17/18 1.84m/s    Time  8    Period  Weeks    Status  New            Plan - 09/24/18 1422    Clinical Impression Statement  Pt continues with some impulsivity during session; partially due to [facility] and decreased acoustics in pool, but also due to the fact pt is always moving and agrees he moves at a fast pace normally. Consistent cueing gains more compliance with most activities. Pt noted decreased back tightness supine sway and noodle hang. Continue to progress core strength and stability as well as balancing hip flexibility and stability.     Rehab Potential  Fair    Clinical Impairments Affecting Rehab Potential  (+) social support, motivation (-) age, chronicity of pain, other comorbidities, multiple failed attempts at pain relief (injection, sx, past PT)    PT Frequency  2x / week    PT Duration  8 weeks    PT Treatment/Interventions  ADLs/Self Care Home Management;Cryotherapy;Traction;Gait training;Therapeutic exercise;Patient/family education;Manual techniques;Passive range of motion;Dry needling;Taping;Neuromuscular re-education;Balance training;Stair training;Electrical Stimulation;Ultrasound;Aquatic Therapy;Moist Heat;Iontophoresis 4mg /ml  Dexamethasone;Therapeutic activities    PT Next Visit Plan  hip/core strengthening, aerobic progression    PT Home Exercise Plan  lower lumbar rotations, seated hamstring stretch, supine glute stretch    Consulted and Agree with Plan of Care  Patient       Patient will benefit from skilled therapeutic intervention in order to improve the following deficits and impairments:  Abnormal gait, Decreased balance, Decreased endurance, Difficulty walking, Decreased range of motion, Improper body mechanics, Impaired tone, Decreased activity tolerance, Decreased strength, Increased fascial restricitons, Impaired flexibility, Postural dysfunction, Pain  Visit Diagnosis: Spinal stenosis of lumbar region with neurogenic claudication  Difficulty in walking, not elsewhere classified  Repeated falls     Problem List Patient Active Problem List   Diagnosis Date Noted  . Malignant neoplasm of right lung ([place]) 04/18/2016  . CAD (coronary artery disease) 04/01/2016  . Adenocarcinoma ([place]) 04/01/2016  . Skin cyst 12/26/2015  . GERD (gastroesophageal reflux disease) 06/16/2015  . Abscess of back 06/06/2015  . Vertigo 03/27/2015  . Carpal tunnel syndrome 10/28/2014  . Status post cholecystectomy 09/29/2014  . Disease of digestive tract 09/29/2014  . Cardiomyopathy ([place]) 09/26/2014  . Type 2 diabetes mellitus without complications ([place])   . Sleep apnea   . Spinal stenosis, lumbar region, with neurogenic claudication 06/07/2014  . Lumbar stenosis with neurogenic claudication 06/07/2014  . Abnormal gait 08/20/2012  .  H/O total knee replacement 08/20/2012  . Arthritis of knee, degenerative 08/20/2012  . Pacemaker-St.Jude 08/03/2012  . Cardiac conduction disorder 06/19/2012  . Acid reflux 06/18/2012  . 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 Triggering of digit 03/25/2012  . Hyperlipidemia 12/23/2011  . Essential hypertension 03/23/2011  . Atrioventricular block, complete ([place]) 03/23/2011  . Complete  atrioventricular block ([place]) 03/23/2011    Larae Grooms 09/24/2018, 2:26 PM  [place] MAIN [facility] SERVICES [address] [place], [place], [address] Phone: [phone]   Fax:  [phone]  Name: Howard [name] MRN: [id number] Date of Birth: 04/26/1931

## 2018-09-29 ENCOUNTER — Encounter: Payer: Self-pay | Admitting: Physical Therapy

## 2018-09-29 ENCOUNTER — Ambulatory Visit: Payer: Medicare Other | Admitting: Physical Therapy

## 2018-09-29 DIAGNOSIS — R262 Difficulty in walking, not elsewhere classified: Secondary | ICD-10-CM | POA: Diagnosis not present

## 2018-09-29 DIAGNOSIS — R296 Repeated falls: Secondary | ICD-10-CM | POA: Diagnosis not present

## 2018-09-29 DIAGNOSIS — M48062 Spinal stenosis, lumbar region with neurogenic claudication: Secondary | ICD-10-CM | POA: Diagnosis not present

## 2018-09-29 DIAGNOSIS — R42 Dizziness and giddiness: Secondary | ICD-10-CM | POA: Diagnosis not present

## 2018-09-29 NOTE — Therapy (Signed)
[place] [facility] [address], [place], [address] Phone: [phone]   Fax:  [phone]  Physical Therapy Treatment  Patient Details  Name: Howard [name] MRN: [id number] Date of Birth: [birth year] Referring Provider (PT): Gollan   Encounter Date: 09/29/2018  PT End of Session - 09/29/18 1124    Visit Number  9    Number of Visits  17    Date for PT Re-Evaluation  10/22/18    Activity Tolerance  Patient tolerated treatment well    Behavior During Therapy  [facility] for tasks assessed/performed       Past Medical History:  Diagnosis Date  . Arthritis   . Bell palsy   . Cancer [facility])    prostate and skin  . Chronic combined systolic and diastolic CHF, NYHA class 1 ([place])    a. 07/2014 Echo: EF 35-40%, Gr 1 DD.  Marland Kitchen Complete heart block ([place])    a. 11/2010 s/p SJM 2210 Accent DC PPM, ser# [id number].  . Depression   . Diabetes mellitus without complication ([place])   . Fall 11-10-14  . GERD (gastroesophageal reflux disease)   . History of prostate cancer   . Hyperlipidemia   . Hypertension   . LBBB (left bundle branch block)   . Left-sided Bell's palsy   . Lung cancer ([place]) 2016  . NICM (nonischemic cardiomyopathy) ([place])    a. 07/2014 Echo: EF 35-40%, mid-apicalanteroseptal DK, Gr 1 DD, mild-mod dil LA.  . Non-obstructive CAD    a. 07/2014 Abnl MV;  b. 08/2014 Cath: LM nl, LAD 30p, RI 40p, LCX nl, OM1 40, RCA dominant 30p, 70d-->Med Rx.  Marland Kitchen Poor balance   . Presence of permanent cardiac pacemaker   . Sleep apnea    a. cpap  . Vertigo   . WPW (Wolff-Parkinson-White syndrome)    a. S/P RFCA 1991.    Past Surgical History:  Procedure Laterality Date  . APPLICATION OF WOUND VAC Left 06/07/2015   Procedure: APPLICATION OF WOUND VAC;  Surgeon: Robert Bellow, MD;  Location: [facility] ORS;  Service: General;  Laterality: Left;  left upper back  . BACK SURGERY     2011  . CARDIAC CATHETERIZATION  08/26/2014   Single vessel  obstructive CAD  . CARPAL TUNNEL RELEASE  04-04-15   [facility]  . CATARACT EXTRACTION  07-31-11 and 09-18-11  . Catheter ablation  1991   for WPW  . cervical fusion    . CHOLECYSTECTOMY  09-07-14  . HAND SURGERY     right 1993; left 2005  . HERNIA REPAIR  1955  . INSERT / REPLACE / REMOVE PACEMAKER    . JOINT REPLACEMENT Left 2013   knee  . JOINT REPLACEMENT Right 2004   knee  . KNEE SURGERY     left knee 1991 and 1992; right knee 1995  . LEFT HEART CATHETERIZATION WITH CORONARY ANGIOGRAM N/A 08/26/2014   Procedure: LEFT HEART CATHETERIZATION WITH CORONARY ANGIOGRAM;  Surgeon: [name], MD;  [facility] CATH LAB;  Service: Cardiovascular;  Laterality: N/A;  . LUMBAR LAMINECTOMY/DECOMPRESSION MICRODISCECTOMY N/A 06/07/2014   Procedure: LUMBAR FOUR TO FIVE LUMBAR LAMINECTOMY/DECOMPRESSION MICRODISCECTOMY 1 LEVEL;  Surgeon: [name], MD;  Location: [place] NEURO ORS;  Service: Neurosurgery;  Laterality: N/A;  . LUNG BIOPSY Right 2016   Dr [name]  . PACEMAKER INSERTION     PPM-- St Jude 11/30/10 by [name]  . PROSTATE SURGERY     cancer--1998, prostatectomy  .  REPLACEMENT TOTAL KNEE     2004  . ruptured disc     1962 and 1998  . TRIGGER FINGER RELEASE  01-24-15  . WOUND DEBRIDEMENT Left 06/07/2015   Procedure: DEBRIDEMENT WOUND;  Surgeon: Robert Bellow, MD;  Location: [facility] ORS;  Service: General;  Laterality: Left;  left upper back    There were no vitals filed for this visit.  Subjective Assessment - 09/29/18 1120    Subjective  Patient reports his back is "a whole lot better". Patient reports no back pain this am, with occassional LLE "giving out". Patient reports that he feels better using his cane, that even if his LE does "lock" that he can prevent falling down. Patient brings in forearm crutch today for PT assessment for use. Patient reports compliance with HEP with no questions or concerns.     Pertinent History  Patient is a 84 year old male reporting iwth LBP with LE weakness. Patient  has hx of spinal stenosis, and and is currently receiving injections for LE weakness. Patient reports he has some L sided LBP in mornings, but his cheif complaint is his bilat LE numbess and weakness that is occuring after he walks for a period of time. Patient [place] and cannot walk up and down his driveway without the legs feeling like they "are going to give out". Pt denies N/V, B&B changes, unexplained weight fluctuation, saddle paresthesia, fever, night sweats, or unrelenting night pain at this time.    How long can you sit comfortably?  unlimited    How long can you stand comfortably?  70mins    How long can you walk comfortably?  2 min    Patient Stated Goals  Walk for extended period of time, walk up/down the driveway    Pain Onset  Other (comment)         Ther-Ex - 5x STS trials with best time reported 14sec - Standing hip abd 3x 10 with max cuing initially to prevent lateral bending with good carry over following - Standing hip ext 3x 10 with min cuing for posture and glute contraction with good carry over following - Mini squat at treadmill bar 3x 10 with dmeo and max cuing initially with good carry over following  Gait Training - Multiple trials of gait training with forearm crutch patient has brought from home. PT adjusted cane to proper height to allow for proper posture with giat - Walk test where patient is able to ambulate for 2 min 38 sec over 328ft with 1 brief standing rest break  - Multiple gait speed trials with cane and forearm crutch with patient able to ambulate with slightly inc gait speed with forearm crutch and patient reporting inc "stability with use of forearm crutch" - Ambulation up approx 20d grade hill outside cliic (55ft down/42ft up) with cane and slight forward lean, with patient reporting no issues with this                        PT Education - 09/29/18 1123    Education Details  Goal review; POC update     Person(s) Educated  Patient    Methods  Explanation;Demonstration;Verbal cues    Comprehension  Verbalized understanding;Returned demonstration;Verbal cues required       PT Short Term Goals - 09/29/18 1124      PT SHORT TERM GOAL #1   Title  Pt will be independent with HEP in order  to improve strength, flexibility, and balance in order to decrease fall risk and improve function at home and work.    Time  4    Period  Weeks    Status  On-going        PT Long Term Goals - 09/29/18 1124      PT LONG TERM GOAL #1   Title  Patient will report ambulation over 10 min without LE numbess/weakness to be able to complete ADLs    Baseline  09/29/18 54min 38sec covering 329ft    Time  8    Period  Weeks    Status  On-going      PT LONG TERM GOAL #2   Title  Pt will decrease 5TSTS by at least 3 seconds in order to demonstrate clinically significant improvement in LE strength    Baseline  09/29/18 14sec    Time  8    Period  Weeks    Status  On-going      PT LONG TERM GOAL #3   Title  Patient will be able to ambulate uphill without LE sx in order to retrieve his morning paper    Baseline  08/28/18: 45ft up and back down (approx 20d grade) with patient reporting "no problem" doing this    Time  8    Period  Weeks    Status  On-going      PT LONG TERM GOAL #4   Title  Pt will increase 10MWT by at least 0.13 m/s in order to demonstrate clinically significant improvement in community ambulation.     Baseline  09/29/18 1.76m/s    Status  On-going      PT LONG TERM GOAL #5   Title  Patient will ambulate 410ft in 2 mins to demonstrate age appropriate distance    Baseline  09/29/18 384ft before needing to sit down with LLE "giving out"            Plan - 09/29/18 1216    Clinical Impression Statement  PT re-asssessed patient goals per medicare guidelines, of which patient is making good progress with pain and functional mobility with addition of AD with gait activities. Patient has some  remaining deficits in endurance and strength that PT will continue to work to improve    Rehab Potential  Fair    Clinical Impairments Affecting Rehab Potential  (+) social support, motivation (-) age, chronicity of pain, other comorbidities, multiple failed attempts at pain relief (injection, sx, past PT)    PT Frequency  2x / week    PT Duration  8 weeks    PT Treatment/Interventions  ADLs/Self Care Home Management;Cryotherapy;Traction;Gait training;Therapeutic exercise;Patient/family education;Manual techniques;Passive range of motion;Dry needling;Taping;Neuromuscular re-education;Balance training;Stair training;Electrical Stimulation;Ultrasound;Aquatic Therapy;Moist Heat;Iontophoresis 4mg /ml Dexamethasone;Therapeutic activities    PT Next Visit Plan  hip/core strengthening, aerobic progression    PT Home Exercise Plan  lower lumbar rotations, seated hamstring stretch, supine glute stretch    Consulted and Agree with Plan of Care  Patient       Patient will benefit from skilled therapeutic intervention in order to improve the following deficits and impairments:  Abnormal gait, Decreased balance, Decreased endurance, Difficulty walking, Decreased range of motion, Improper body mechanics, Impaired tone, Decreased activity tolerance, Decreased strength, Increased fascial restricitons, Impaired flexibility, Postural dysfunction, Pain  Visit Diagnosis: Spinal stenosis of lumbar region with neurogenic claudication     Problem List Patient Active Problem List   Diagnosis Date Noted  . Malignant neoplasm of right lung (  [place]) 04/18/2016  . CAD (coronary artery disease) 04/01/2016  . Adenocarcinoma ([place]) 04/01/2016  . Skin cyst 12/26/2015  . GERD (gastroesophageal reflux disease) 06/16/2015  . Abscess of back 06/06/2015  . Vertigo 03/27/2015  . Carpal tunnel syndrome 10/28/2014  . Status post cholecystectomy 09/29/2014  . Disease of digestive tract 09/29/2014  . Cardiomyopathy ([place])  09/26/2014  . Type 2 diabetes mellitus without complications ([place])   . Sleep apnea   . Spinal stenosis, lumbar region, with neurogenic claudication 06/07/2014  . Lumbar stenosis with neurogenic claudication 06/07/2014  . Abnormal gait 08/20/2012  . H/O total knee replacement 08/20/2012  . Arthritis of knee, degenerative 08/20/2012  . Pacemaker-St.Jude 08/03/2012  . Cardiac conduction disorder 06/19/2012  . Acid reflux 06/18/2012  . 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 Triggering of digit 03/25/2012  . Hyperlipidemia 12/23/2011  . Essential hypertension 03/23/2011  . Atrioventricular block, complete ([place]) 03/23/2011  . Complete atrioventricular block ([place]) 03/23/2011   Shelton Silvas PT, DPT Shelton Silvas 09/29/2018, 12:32 PM  [place] [facility] [address], [place], [address] Phone: [phone]   Fax:  [phone]  Name: CURTIES CONIGLIARO MRN: [id number] Date of Birth: 09-12-1931

## 2018-09-30 ENCOUNTER — Ambulatory Visit (INDEPENDENT_AMBULATORY_CARE_PROVIDER_SITE_OTHER): Payer: Medicare Other | Admitting: *Deleted

## 2018-09-30 DIAGNOSIS — I442 Atrioventricular block, complete: Secondary | ICD-10-CM

## 2018-10-01 ENCOUNTER — Encounter: Payer: Medicare Other | Admitting: Physical Therapy

## 2018-10-01 ENCOUNTER — Ambulatory Visit: Payer: Medicare Other

## 2018-10-01 ENCOUNTER — Other Ambulatory Visit: Payer: Self-pay

## 2018-10-01 DIAGNOSIS — R296 Repeated falls: Secondary | ICD-10-CM | POA: Diagnosis not present

## 2018-10-01 DIAGNOSIS — R262 Difficulty in walking, not elsewhere classified: Secondary | ICD-10-CM

## 2018-10-01 DIAGNOSIS — M48062 Spinal stenosis, lumbar region with neurogenic claudication: Secondary | ICD-10-CM | POA: Diagnosis not present

## 2018-10-01 DIAGNOSIS — R42 Dizziness and giddiness: Secondary | ICD-10-CM

## 2018-10-01 NOTE — Therapy (Signed)
[place] MAIN [facility] SERVICES [address] [place], [place], [address] Phone: [phone]   Fax:  [phone]  Physical Therapy Treatment  Patient Details  Name: Howard [name] MRN: [id number] Date of Birth: November 29, 1931 Referring Provider (PT): Gollan   Encounter Date: 10/01/2018  PT End of Session - 10/01/18 1520    Visit Number  10    Number of Visits  17    Date for PT Re-Evaluation  10/22/18    PT Start Time  1330    PT Stop Time  1415    PT Time Calculation (min)  45 min    Activity Tolerance  Patient tolerated treatment well    Behavior During Therapy  [facility] for tasks assessed/performed       Past Medical History:  Diagnosis Date  . Arthritis   . Bell palsy   . Cancer [facility])    prostate and skin  . Chronic combined systolic and diastolic CHF, NYHA class 1 ([place])    a. 07/2014 Echo: EF 35-40%, Gr 1 DD.  Marland Kitchen Complete heart block ([place])    a. 11/2010 s/p SJM 2210 Accent DC PPM, ser# [id number].  . Depression   . Diabetes mellitus without complication ([place])   . Fall 11-10-14  . GERD (gastroesophageal reflux disease)   . History of prostate cancer   . Hyperlipidemia   . Hypertension   . LBBB (left bundle branch block)   . Left-sided Bell's palsy   . Lung cancer ([place]) 2016  . NICM (nonischemic cardiomyopathy) ([place])    a. 07/2014 Echo: EF 35-40%, mid-apicalanteroseptal DK, Gr 1 DD, mild-mod dil LA.  . Non-obstructive CAD    a. 07/2014 Abnl MV;  b. 08/2014 Cath: LM nl, LAD 30p, RI 40p, LCX nl, OM1 40, RCA dominant 30p, 70d-->Med Rx.  Marland Kitchen Poor balance   . Presence of permanent cardiac pacemaker   . Sleep apnea    a. cpap  . Vertigo   . WPW (Wolff-Parkinson-White syndrome)    a. S/P RFCA 1991.    Past Surgical History:  Procedure Laterality Date  . APPLICATION OF WOUND VAC Left 06/07/2015   Procedure: APPLICATION OF WOUND VAC;  Surgeon: Robert Bellow, MD;  Location: [facility] ORS;  Service: General;  Laterality: Left;  left upper back  .  BACK SURGERY     2011  . CARDIAC CATHETERIZATION  08/26/2014   Single vessel obstructive CAD  . CARPAL TUNNEL RELEASE  04-04-15   [facility]  . CATARACT EXTRACTION  07-31-11 and 09-18-11  . Catheter ablation  1991   for WPW  . cervical fusion    . CHOLECYSTECTOMY  09-07-14  . HAND SURGERY     right 1993; left 2005  . HERNIA REPAIR  1955  . INSERT / REPLACE / REMOVE PACEMAKER    . JOINT REPLACEMENT Left 2013   knee  . JOINT REPLACEMENT Right 2004   knee  . KNEE SURGERY     left knee 1991 and 1992; right knee 1995  . LEFT HEART CATHETERIZATION WITH CORONARY ANGIOGRAM N/A 08/26/2014   Procedure: LEFT HEART CATHETERIZATION WITH CORONARY ANGIOGRAM;  Surgeon: [name], MD;  Location: [facility] CATH LAB;  Service: Cardiovascular;  Laterality: N/A;  . LUMBAR LAMINECTOMY/DECOMPRESSION MICRODISCECTOMY N/A 06/07/2014   Procedure: LUMBAR FOUR TO FIVE LUMBAR LAMINECTOMY/DECOMPRESSION MICRODISCECTOMY 1 LEVEL;  Surgeon: [name], MD;  Location: [place] NEURO ORS;  Service: Neurosurgery;  Laterality: N/A;  . LUNG BIOPSY Right 2016   Dr [name]  .  PACEMAKER INSERTION     PPM-- St Jude 11/30/10 by [name]  . PROSTATE SURGERY     cancer--1998, prostatectomy  . REPLACEMENT TOTAL KNEE     2004  . ruptured disc     1962 and 1998  . TRIGGER FINGER RELEASE  01-24-15  . WOUND DEBRIDEMENT Left 06/07/2015   Procedure: DEBRIDEMENT WOUND;  Surgeon: Robert Bellow, MD;  Location: [facility] ORS;  Service: General;  Laterality: Left;  left upper back    There were no vitals filed for this visit.  Subjective Assessment - 10/01/18 1517    Subjective  Pt reports no pain in back. Denies any recent LLE giving out; continues to use cane, which is helpful in giving support to BLEs    Pertinent History  Patient is a 84 year old male reporting iwth LBP with LE weakness. Patient has hx of spinal stenosis, and and is currently receiving injections for LE weakness. Patient reports he has some L sided LBP in mornings, but his cheif complaint is his  bilat LE numbess and weakness that is occuring after he walks for a period of time. Patient reports [place] and cannot walk up and down his driveway without the legs feeling like they "are going to give out". Pt denies N/V, B&B changes, unexplained weight fluctuation, saddle paresthesia, fever, night sweats, or unrelenting night pain at this time.      Enters/exits via ramp  Ambulation, blue dumbbells (for postural awareness/slowed ambulation)  Fwd 4 L   Side 4 L  Bkwd 4 L  Core/LE strength, 20x ea  Hip abd/add  Hip flex/ext  Resisted squat, supernoodle  Bench  Seated   B hip ext, supernoodle, 20x ea Modified Plank   B hip abd, 20x ea  Core with UE strength  Red dumbbells, 20x ea   Triceps pressdowns   Sh abd/add   Sh flex/ext   Sh horiz abd/add  Active stretching, 3x ea  Hams/gastrocs and hip flexor/quads  LB, center/R/L                         PT Education - 10/01/18 1519    Education Details  Core bracing, resisted squat, stretching, resisted hip extension, Modified plank work    [employer]) Educated  Patient    Methods  Explanation;Demonstration;Tactile cues;Verbal cues    Comprehension  Verbalized understanding;Returned demonstration;Verbal cues required;Tactile cues required       PT Short Term Goals - 09/29/18 1124      PT SHORT TERM GOAL #1   Title  Pt will be independent with HEP in order to improve strength, flexibility, and balance in order to decrease fall risk and improve function at home and work.    Time  4    Period  Weeks    Status  On-going        PT Long Term Goals - 09/29/18 1124      PT LONG TERM GOAL #1   Title  Patient will report ambulation over 10 min without LE numbess/weakness to be able to complete ADLs    Baseline  09/29/18 43min 38sec covering 325ft    Time  8    Period  Weeks    Status  On-going      PT LONG TERM GOAL #2   Title  Pt will decrease 5TSTS by at least 3 seconds in order to  demonstrate clinically significant improvement in LE strength    Baseline  09/29/18 14sec    Time  8    Period  Weeks    Status  On-going      PT LONG TERM GOAL #3   Title  Patient will be able to ambulate uphill without LE sx in order to retrieve his morning paper    Baseline  08/28/18: 31ft up and back down (approx 20d grade) with patient reporting "no problem" doing this    Time  8    Period  Weeks    Status  On-going      PT LONG TERM GOAL #4   Title  Pt will increase 10MWT by at least 0.13 m/s in order to demonstrate clinically significant improvement in community ambulation.     Baseline  09/29/18 1.39m/s    Status  On-going      PT LONG TERM GOAL #5   Title  Patient will ambulate 472ft in 2 mins to demonstrate age appropriate distance    Baseline  09/29/18 392ft before needing to sit down with LLE "giving out"            Plan - 10/01/18 1521    Clinical Impression Statement  Pt continues to require cues for core and postural awareness with exercises. Demonstrates improved speed with core/LE strength exercises with tactile cues to maintain strong upright posture. Addition of backward ambulation and modified plank work as well as resisted hip extension for posterior muscles strengthening. Continue to progress strength and overall postural/body awareness to improve function.     Rehab Potential  Fair    Clinical Impairments Affecting Rehab Potential  (+) social support, motivation (-) age, chronicity of pain, other comorbidities, multiple failed attempts at pain relief (injection, sx, past PT)    PT Frequency  2x / week    PT Duration  8 weeks    PT Treatment/Interventions  ADLs/Self Care Home Management;Cryotherapy;Traction;Gait training;Therapeutic exercise;Patient/family education;Manual techniques;Passive range of motion;Dry needling;Taping;Neuromuscular re-education;Balance training;Stair training;Electrical Stimulation;Ultrasound;Aquatic Therapy;Moist Heat;Iontophoresis 4mg /ml  Dexamethasone;Therapeutic activities    PT Next Visit Plan  hip/core strengthening, aerobic progression    PT Home Exercise Plan  lower lumbar rotations, seated hamstring stretch, supine glute stretch    Consulted and Agree with Plan of Care  Patient       Patient will benefit from skilled therapeutic intervention in order to improve the following deficits and impairments:  Abnormal gait, Decreased balance, Decreased endurance, Difficulty walking, Decreased range of motion, Improper body mechanics, Impaired tone, Decreased activity tolerance, Decreased strength, Increased fascial restricitons, Impaired flexibility, Postural dysfunction, Pain  Visit Diagnosis: Spinal stenosis of lumbar region with neurogenic claudication  Difficulty in walking, not elsewhere classified  Repeated falls  Dizziness and giddiness     Problem List Patient Active Problem List   Diagnosis Date Noted  . Malignant neoplasm of right lung ([place]) 04/18/2016  . CAD (coronary artery disease) 04/01/2016  . Adenocarcinoma ([place]) 04/01/2016  . Skin cyst 12/26/2015  . GERD (gastroesophageal reflux disease) 06/16/2015  . Abscess of back 06/06/2015  . Vertigo 03/27/2015  . Carpal tunnel syndrome 10/28/2014  . Status post cholecystectomy 09/29/2014  . Disease of digestive tract 09/29/2014  . Cardiomyopathy ([place]) 09/26/2014  . Type 2 diabetes mellitus without complications ([place])   . Sleep apnea   . Spinal stenosis, lumbar region, with neurogenic claudication 06/07/2014  . Lumbar stenosis with neurogenic claudication 06/07/2014  . Abnormal gait 08/20/2012  . H/O total knee replacement 08/20/2012  . Arthritis of knee, degenerative 08/20/2012  . Pacemaker-St.Jude 08/03/2012  . Cardiac  conduction disorder 06/19/2012  . Acid reflux 06/18/2012  . 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 Triggering of digit 03/25/2012  . Hyperlipidemia 12/23/2011  . Essential hypertension 03/23/2011  . Atrioventricular block, complete  ([place]) 03/23/2011  . Complete atrioventricular block ([place]) 03/23/2011    Larae Grooms 10/01/2018, 3:25 PM  [place] MAIN [facility] SERVICES [address] [place], [place], [address] Phone: [phone]   Fax:  [phone]  Name: Howard [name] MRN: [id number] Date of Birth: [birth year]

## 2018-10-01 NOTE — Progress Notes (Signed)
Remote pacemaker transmission.   

## 2018-10-02 DIAGNOSIS — Z23 Encounter for immunization: Secondary | ICD-10-CM | POA: Diagnosis not present

## 2018-10-05 ENCOUNTER — Ambulatory Visit: Payer: Medicare Other

## 2018-10-05 ENCOUNTER — Encounter: Payer: Self-pay | Admitting: Physical Therapy

## 2018-10-05 DIAGNOSIS — M48062 Spinal stenosis, lumbar region with neurogenic claudication: Secondary | ICD-10-CM | POA: Diagnosis not present

## 2018-10-05 DIAGNOSIS — R296 Repeated falls: Secondary | ICD-10-CM

## 2018-10-05 DIAGNOSIS — R262 Difficulty in walking, not elsewhere classified: Secondary | ICD-10-CM | POA: Diagnosis not present

## 2018-10-05 DIAGNOSIS — X32XXXA Exposure to sunlight, initial encounter: Secondary | ICD-10-CM | POA: Diagnosis not present

## 2018-10-05 DIAGNOSIS — L570 Actinic keratosis: Secondary | ICD-10-CM | POA: Diagnosis not present

## 2018-10-05 DIAGNOSIS — Z08 Encounter for follow-up examination after completed treatment for malignant neoplasm: Secondary | ICD-10-CM | POA: Diagnosis not present

## 2018-10-05 DIAGNOSIS — D485 Neoplasm of uncertain behavior of skin: Secondary | ICD-10-CM | POA: Diagnosis not present

## 2018-10-05 DIAGNOSIS — D044 Carcinoma in situ of skin of scalp and neck: Secondary | ICD-10-CM | POA: Diagnosis not present

## 2018-10-05 DIAGNOSIS — Z85828 Personal history of other malignant neoplasm of skin: Secondary | ICD-10-CM | POA: Diagnosis not present

## 2018-10-05 DIAGNOSIS — R42 Dizziness and giddiness: Secondary | ICD-10-CM | POA: Diagnosis not present

## 2018-10-05 DIAGNOSIS — Z872 Personal history of diseases of the skin and subcutaneous tissue: Secondary | ICD-10-CM | POA: Diagnosis not present

## 2018-10-05 NOTE — Therapy (Signed)
[facility] [address] [place], [address] Phone: [phone]   Fax:  [phone]  Physical Therapy Treatment  Patient Details  Name: Howard [name] MRN: [id number] Date of Birth: Sep 24, 1931 Referring Provider (PT): Gollan   Encounter Date: 10/05/2018  PT End of Session - 10/05/18 1152    Visit Number  11    Number of Visits  17    Date for PT Re-Evaluation  10/22/18    PT Start Time  1100    PT Stop Time  1145    PT Time Calculation (min)  45 min    Activity Tolerance  Patient tolerated treatment well    Behavior During Therapy  [facility] for tasks assessed/performed       Past Medical History:  Diagnosis Date  . Arthritis   . Bell palsy   . Cancer [facility])    prostate and skin  . Chronic combined systolic and diastolic CHF, NYHA class 1 ([place])    a. 07/2014 Echo: EF 35-40%, Gr 1 DD.  Marland Kitchen Complete heart block ([place])    a. 11/2010 s/p SJM 2210 Accent DC PPM, ser# [id number].  . Depression   . Diabetes mellitus without complication ([place])   . Fall 11-10-14  . GERD (gastroesophageal reflux disease)   . History of prostate cancer   . Hyperlipidemia   . Hypertension   . LBBB (left bundle branch block)   . Left-sided Bell's palsy   . Lung cancer ([place]) 2016  . NICM (nonischemic cardiomyopathy) ([place])    a. 07/2014 Echo: EF 35-40%, mid-apicalanteroseptal DK, Gr 1 DD, mild-mod dil LA.  . Non-obstructive CAD    a. 07/2014 Abnl MV;  b. 08/2014 Cath: LM nl, LAD 30p, RI 40p, LCX nl, OM1 40, RCA dominant 30p, 70d-->Med Rx.  Marland Kitchen Poor balance   . Presence of permanent cardiac pacemaker   . Sleep apnea    a. cpap  . Vertigo   . WPW (Wolff-Parkinson-White syndrome)    a. S/P RFCA 1991.    Past Surgical History:  Procedure Laterality Date  . APPLICATION OF WOUND VAC Left 06/07/2015   Procedure: APPLICATION OF WOUND VAC;  Surgeon: Robert Bellow, MD;  Location: [facility] ORS;  Service: General;  Laterality: Left;  left upper  back  . BACK SURGERY     2011  . CARDIAC CATHETERIZATION  08/26/2014   Single vessel obstructive CAD  . CARPAL TUNNEL RELEASE  04-04-15   [facility]  . CATARACT EXTRACTION  07-31-11 and 09-18-11  . Catheter ablation  1991   for WPW  . cervical fusion    . CHOLECYSTECTOMY  09-07-14  . HAND SURGERY     right 1993; left 2005  . HERNIA REPAIR  1955  . INSERT / REPLACE / REMOVE PACEMAKER    . JOINT REPLACEMENT Left 2013   knee  . JOINT REPLACEMENT Right 2004   knee  . KNEE SURGERY     left knee 1991 and 1992; right knee 1995  . LEFT HEART CATHETERIZATION WITH CORONARY ANGIOGRAM N/A 08/26/2014   Procedure: LEFT HEART CATHETERIZATION WITH CORONARY ANGIOGRAM;  Surgeon: [name], MD;  Location: [facility] CATH LAB;  Service: Cardiovascular;  Laterality: N/A;  . LUMBAR LAMINECTOMY/DECOMPRESSION MICRODISCECTOMY N/A 06/07/2014   Procedure: LUMBAR FOUR TO FIVE LUMBAR LAMINECTOMY/DECOMPRESSION MICRODISCECTOMY 1 LEVEL;  Surgeon: [name], MD;  Location: [facility] NEURO ORS;  Service: Neurosurgery;  Laterality: N/A;  . LUNG BIOPSY Right 2016   Dr  Mulvane     PPM-- St Jude 11/30/10 by [name]  . PROSTATE SURGERY     cancer--1998, prostatectomy  . REPLACEMENT TOTAL KNEE     2004  . ruptured disc     1962 and 1998  . TRIGGER FINGER RELEASE  01-24-15  . WOUND DEBRIDEMENT Left 06/07/2015   Procedure: DEBRIDEMENT WOUND;  Surgeon: Robert Bellow, MD;  Location: [facility] ORS;  Service: General;  Laterality: Left;  left upper back    There were no vitals filed for this visit.  Subjective Assessment - 10/05/18 1150    Subjective  Patient has no complaints of pain at start of session, states he has not had any falls since last visit.     Pertinent History  Patient is a 84 year old male reporting iwth LBP with LE weakness. Patient has hx of spinal stenosis, and and is currently receiving injections for LE weakness. Patient reports he has some L sided LBP in mornings, but his cheif complaint is his bilat LE  numbess and weakness that is occuring after he walks for a period of time. Patient [place] and cannot walk up and down his driveway without the legs feeling like they "are going to give out". Pt denies N/V, B&B changes, unexplained weight fluctuation, saddle paresthesia, fever, night sweats, or unrelenting night pain at this time.    Currently in Pain?  No/denies       Ther-Ex - 72min on recumbant bike with patient leaning forward, for safe aerobic/muscular endurance activity -Bridge 2x10 with glute set prior to lift and pelvic tilt, verbal/visual/tactile cues needed  - Posterior pelvic tilt with demo and max cuing initially and then intermittently for proper form; with good carry over following 3x 10 with 2sec hold - Hooklying marching from 4in step 2x 6/8(each LE) with demo and max cuing to maintain core contraction with good carry over Seated LAQ with 2# AW with focus on eccentric lowering 2x15 b/l cues for slow lowering intermittently Standing HS curl with 3# AW 2x15 b/l with UE support Standing marching with 3# AW 3x10 with b/l UE support, cues to avoid weight bearing in hands as much as possible.     PT Education - 10/05/18 1150    Education Details  therapeutic exercise form/technique    Person(s) Educated  Patient    Methods  Explanation;Demonstration    Comprehension  Verbalized understanding;Returned demonstration;Verbal cues required;Tactile cues required       PT Short Term Goals - 09/29/18 1124      PT SHORT TERM GOAL #1   Title  Pt will be independent with HEP in order to improve strength, flexibility, and balance in order to decrease fall risk and improve function at home and work.    Time  4    Period  Weeks    Status  On-going        PT Long Term Goals - 09/29/18 1124      PT LONG TERM GOAL #1   Title  Patient will report ambulation over 10 min without LE numbess/weakness to be able to complete ADLs    Baseline  09/29/18 43min 38sec covering  359ft    Time  8    Period  Weeks    Status  On-going      PT LONG TERM GOAL #2   Title  Pt will decrease 5TSTS by at least 3 seconds in order to demonstrate clinically significant improvement in  LE strength    Baseline  09/29/18 14sec    Time  8    Period  Weeks    Status  On-going      PT LONG TERM GOAL #3   Title  Patient will be able to ambulate uphill without LE sx in order to retrieve his morning paper    Baseline  08/28/18: 80ft up and back down (approx 20d grade) with patient reporting "no problem" doing this    Time  8    Period  Weeks    Status  On-going      PT LONG TERM GOAL #4   Title  Pt will increase 10MWT by at least 0.13 m/s in order to demonstrate clinically significant improvement in community ambulation.     Baseline  09/29/18 1.74m/s    Status  On-going      PT LONG TERM GOAL #5   Title  Patient will ambulate 462ft in 2 mins to demonstrate age appropriate distance    Baseline  09/29/18 365ft before needing to sit down with LLE "giving out"            Plan - 10/05/18 1151    Clinical Impression Statement  Patient demonstrates difficulty with core and postural awareness during exercises. States he does have a hernia, visible in supine with core activation. No complaints of fatigue or pain during session. The patient would benefit from further skilled PT to continue to progress towards goals and to improve safety and mobility.     PT Frequency  2x / week    PT Duration  8 weeks    PT Treatment/Interventions  ADLs/Self Care Home Management;Cryotherapy;Traction;Gait training;Therapeutic exercise;Patient/family education;Manual techniques;Passive range of motion;Dry needling;Taping;Neuromuscular re-education;Balance training;Stair training;Electrical Stimulation;Ultrasound;Aquatic Therapy;Moist Heat;Iontophoresis 4mg /ml Dexamethasone;Therapeutic activities       Patient will benefit from skilled therapeutic intervention in order to improve the following deficits  and impairments:  Abnormal gait, Decreased balance, Decreased endurance, Difficulty walking, Decreased range of motion, Improper body mechanics, Impaired tone, Decreased activity tolerance, Decreased strength, Increased fascial restricitons, Impaired flexibility, Postural dysfunction, Pain  Visit Diagnosis: Difficulty in walking, not elsewhere classified  Spinal stenosis of lumbar region with neurogenic claudication  Repeated falls     Problem List Patient Active Problem List   Diagnosis Date Noted  . Malignant neoplasm of right lung ([place]) 04/18/2016  . CAD (coronary artery disease) 04/01/2016  . Adenocarcinoma ([place]) 04/01/2016  . Skin cyst 12/26/2015  . GERD (gastroesophageal reflux disease) 06/16/2015  . Abscess of back 06/06/2015  . Vertigo 03/27/2015  . Carpal tunnel syndrome 10/28/2014  . Status post cholecystectomy 09/29/2014  . Disease of digestive tract 09/29/2014  . Cardiomyopathy ([place]) 09/26/2014  . Type 2 diabetes mellitus without complications ([place])   . Sleep apnea   . Spinal stenosis, lumbar region, with neurogenic claudication 06/07/2014  . Lumbar stenosis with neurogenic claudication 06/07/2014  . Abnormal gait 08/20/2012  . H/O total knee replacement 08/20/2012  . Arthritis of knee, degenerative 08/20/2012  . Pacemaker-St.Jude 08/03/2012  . Cardiac conduction disorder 06/19/2012  . Acid reflux 06/18/2012  . 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 Triggering of digit 03/25/2012  . Hyperlipidemia 12/23/2011  . Essential hypertension 03/23/2011  . Atrioventricular block, complete ([place]) 03/23/2011  . Complete atrioventricular block [facility]) 03/23/2011    Lieutenant Diego PT, DPT 11:56 AM,10/05/18 [id number]  [facility] [address], [place], [address] Phone: [phone]   Fax:  [phone]  Name: Cliford  TYSHAWN CIULLO MRN: [id number] Date of Birth: 06-21-1931

## 2018-10-07 ENCOUNTER — Other Ambulatory Visit: Payer: Self-pay | Admitting: Cardiovascular Disease

## 2018-10-08 ENCOUNTER — Encounter: Payer: Medicare Other | Admitting: Physical Therapy

## 2018-10-08 ENCOUNTER — Ambulatory Visit: Payer: Medicare Other

## 2018-10-08 ENCOUNTER — Other Ambulatory Visit: Payer: Self-pay

## 2018-10-08 DIAGNOSIS — R296 Repeated falls: Secondary | ICD-10-CM

## 2018-10-08 DIAGNOSIS — R262 Difficulty in walking, not elsewhere classified: Secondary | ICD-10-CM

## 2018-10-08 DIAGNOSIS — R42 Dizziness and giddiness: Secondary | ICD-10-CM | POA: Diagnosis not present

## 2018-10-08 DIAGNOSIS — M48062 Spinal stenosis, lumbar region with neurogenic claudication: Secondary | ICD-10-CM

## 2018-10-08 NOTE — Therapy (Signed)
[place] MAIN [facility] SERVICES [address] [place], [place], [address] Phone: [phone]   Fax:  [phone]  Physical Therapy Treatment  Patient Details  Name: Howard [name] MRN: [id number] Date of Birth: 11-Sep-1931 Referring Provider (PT): [name]   Encounter Date: 10/08/2018  PT End of Session - 10/08/18 1531    Visit Number  12    Number of Visits  17    Date for PT Re-Evaluation  10/22/18    PT Start Time  4166    PT Stop Time  1500    PT Time Calculation (min)  45 min    Activity Tolerance  Patient tolerated treatment well    Behavior During Therapy  [facility] for tasks assessed/performed       Past Medical History:  Diagnosis Date  . Arthritis   . Bell palsy   . Cancer [facility])    prostate and skin  . Chronic combined systolic and diastolic CHF, NYHA class 1 ([place])    a. 07/2014 Echo: EF 35-40%, Gr 1 DD.  Marland Kitchen Complete heart block ([place])    a. 11/2010 s/p SJM 2210 Accent DC PPM, ser# [id number].  . Depression   . Diabetes mellitus without complication ([place])   . Fall 11-10-14  . GERD (gastroesophageal reflux disease)   . History of prostate cancer   . Hyperlipidemia   . Hypertension   . LBBB (left bundle branch block)   . Left-sided Bell's palsy   . Lung cancer ([place]) 2016  . NICM (nonischemic cardiomyopathy) ([place])    a. 07/2014 Echo: EF 35-40%, mid-apicalanteroseptal DK, Gr 1 DD, mild-mod dil LA.  . Non-obstructive CAD    a. 07/2014 Abnl MV;  b. 08/2014 Cath: LM nl, LAD 30p, RI 40p, LCX nl, OM1 40, RCA dominant 30p, 70d-->Med Rx.  Marland Kitchen Poor balance   . Presence of permanent cardiac pacemaker   . Sleep apnea    a. cpap  . Vertigo   . WPW (Wolff-Parkinson-White syndrome)    a. S/P RFCA 1991.    Past Surgical History:  Procedure Laterality Date  . APPLICATION OF WOUND VAC Left 06/07/2015   Procedure: APPLICATION OF WOUND VAC;  Surgeon: Robert Bellow, MD;  Location: [facility] ORS;  Service: General;  Laterality: Left;  left upper back  .  BACK SURGERY     2011  . CARDIAC CATHETERIZATION  08/26/2014   Single vessel obstructive CAD  . CARPAL TUNNEL RELEASE  04-04-15   [facility]  . CATARACT EXTRACTION  07-31-11 and 09-18-11  . Catheter ablation  1991   for WPW  . cervical fusion    . CHOLECYSTECTOMY  09-07-14  . HAND SURGERY     right 1993; left 2005  . HERNIA REPAIR  1955  . INSERT / REPLACE / REMOVE PACEMAKER    . JOINT REPLACEMENT Left 2013   knee  . JOINT REPLACEMENT Right 2004   knee  . KNEE SURGERY     left knee 1991 and 1992; right knee 1995  . LEFT HEART CATHETERIZATION WITH CORONARY ANGIOGRAM N/A 08/26/2014   Procedure: LEFT HEART CATHETERIZATION WITH CORONARY ANGIOGRAM;  Surgeon: [name], MD;  Location: [facility] CATH LAB;  Service: Cardiovascular;  Laterality: N/A;  . LUMBAR LAMINECTOMY/DECOMPRESSION MICRODISCECTOMY N/A 06/07/2014   Procedure: LUMBAR FOUR TO FIVE LUMBAR LAMINECTOMY/DECOMPRESSION MICRODISCECTOMY 1 LEVEL;  Surgeon: [name], MD;  Location: [facility] NEURO ORS;  Service: Neurosurgery;  Laterality: N/A;  . LUNG BIOPSY Right 2016   Dr [name]  .  PACEMAKER INSERTION     PPM-- St Jude 11/30/10 by [name]  . PROSTATE SURGERY     cancer--1998, prostatectomy  . REPLACEMENT TOTAL KNEE     2004  . ruptured disc     1962 and 1998  . TRIGGER FINGER RELEASE  01-24-15  . WOUND DEBRIDEMENT Left 06/07/2015   Procedure: DEBRIDEMENT WOUND;  Surgeon: Robert Bellow, MD;  Location: [facility] ORS;  Service: General;  Laterality: Left;  left upper back    There were no vitals filed for this visit.  Subjective Assessment - 10/08/18 1527    Subjective  Pt reports continued frustration with L thigh "not working". Further questioning to understand what pt means by this. Pt has difficulty describing, but states "it is just done, when it happens I need to just sit down and rest". Pt states it is not really painful and does not recall if knee gives way, but states thigh is almost numb like.     Pertinent History  Patient is a 84 year old male  reporting iwth LBP with LE weakness. Patient has hx of spinal stenosis, and and is currently receiving injections for LE weakness. Patient reports he has some L sided LBP in mornings, but his cheif complaint is his bilat LE numbess and weakness that is occuring after he walks for a period of time. Patient reports [place] and cannot walk up and down his driveway without the legs feeling like they "are going to give out". Pt denies N/V, B&B changes, unexplained weight fluctuation, saddle paresthesia, fever, night sweats, or unrelenting night pain at this time.      Enters/exits via ramp  Ambulation  4 L fwd  4 L side   Active QL/flank/ITB stretch, B 3 x 10 sec ea  Noodle hang (2 noodles, 1 under each arm), 5 min  Noodles, A for positioning  LE work   Community education officer, 3 min   Scissor, 3 min   Flutter, 3 min  Passive lateral trunk flexion with noodles, supine, 5 min  LB active stretching with gentle manual sweeps middle, R, L 4 x 10 sec each  Warm water stretching (hot tub), B 3 x 10 sec ea with assist  Seated hamstrings  Seated fig 4   SKTC  Cross body piriformis  Stand hip flexor at step                         PT Education - 10/08/18 1530    Education Details  stretching    Person(s) Educated  Patient    Methods  Explanation;Demonstration    Comprehension  Verbalized understanding;Verbal cues required;Tactile cues required;Need further instruction;Returned demonstration       PT Short Term Goals - 09/29/18 1124      PT SHORT TERM GOAL #1   Title  Pt will be independent with HEP in order to improve strength, flexibility, and balance in order to decrease fall risk and improve function at home and work.    Time  4    Period  Weeks    Status  On-going        PT Long Term Goals - 09/29/18 1124      PT LONG TERM GOAL #1   Title  Patient will report ambulation over 10 min without LE numbess/weakness to be able to complete ADLs    Baseline   09/29/18 78min 38sec covering 360ft    Time  8  Period  Weeks    Status  On-going      PT LONG TERM GOAL #2   Title  Pt will decrease 5TSTS by at least 3 seconds in order to demonstrate clinically significant improvement in LE strength    Baseline  09/29/18 14sec    Time  8    Period  Weeks    Status  On-going      PT LONG TERM GOAL #3   Title  Patient will be able to ambulate uphill without LE sx in order to retrieve his morning paper    Baseline  08/28/18: 26ft up and back down (approx 20d grade) with patient reporting "no problem" doing this    Time  8    Period  Weeks    Status  On-going      PT LONG TERM GOAL #4   Title  Pt will increase 10MWT by at least 0.13 m/s in order to demonstrate clinically significant improvement in community ambulation.     Baseline  09/29/18 1.81m/s    Status  On-going      PT LONG TERM GOAL #5   Title  Patient will ambulate 460ft in 2 mins to demonstrate age appropriate distance    Baseline  09/29/18 344ft before needing to sit down with LLE "giving out"            Plan - 10/08/18 1531    Clinical Impression Statement  Focused session more on LB decompression and stretching through back, B flank and LEs. Continued cues for posture with ambulation in the pool; pt has difficulty increasing stride and maintaining upright posture (noted to have tight hip flexors). Oddly enough pt does not feel any stretch subjectively with standing hip flexor stretch at step despite tactile cues for form. (possibe sensory nerve disruption??) Assess next visit how pt tolerated this session approach and continue as appropriate.     PT Frequency  2x / week    PT Duration  8 weeks    PT Treatment/Interventions  ADLs/Self Care Home Management;Cryotherapy;Traction;Gait training;Therapeutic exercise;Patient/family education;Manual techniques;Passive range of motion;Dry needling;Taping;Neuromuscular re-education;Balance training;Stair training;Electrical  Stimulation;Ultrasound;Aquatic Therapy;Moist Heat;Iontophoresis 4mg /ml Dexamethasone;Therapeutic activities       Patient will benefit from skilled therapeutic intervention in order to improve the following deficits and impairments:  Abnormal gait, Decreased balance, Decreased endurance, Difficulty walking, Decreased range of motion, Improper body mechanics, Impaired tone, Decreased activity tolerance, Decreased strength, Increased fascial restricitons, Impaired flexibility, Postural dysfunction, Pain  Visit Diagnosis: Difficulty in walking, not elsewhere classified  Spinal stenosis of lumbar region with neurogenic claudication  Repeated falls     Problem List Patient Active Problem List   Diagnosis Date Noted  . Malignant neoplasm of right lung ([place]) 04/18/2016  . CAD (coronary artery disease) 04/01/2016  . Adenocarcinoma ([place]) 04/01/2016  . Skin cyst 12/26/2015  . GERD (gastroesophageal reflux disease) 06/16/2015  . Abscess of back 06/06/2015  . Vertigo 03/27/2015  . Carpal tunnel syndrome 10/28/2014  . Status post cholecystectomy 09/29/2014  . Disease of digestive tract 09/29/2014  . Cardiomyopathy ([place]) 09/26/2014  . Type 2 diabetes mellitus without complications ([place])   . Sleep apnea   . Spinal stenosis, lumbar region, with neurogenic claudication 06/07/2014  . Lumbar stenosis with neurogenic claudication 06/07/2014  . Abnormal gait 08/20/2012  . H/O total knee replacement 08/20/2012  . Arthritis of knee, degenerative 08/20/2012  . Pacemaker-St.Jude 08/03/2012  . Cardiac conduction disorder 06/19/2012  . Acid reflux 06/18/2012  . 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  Triggering of digit 03/25/2012  . Hyperlipidemia 12/23/2011  . Essential hypertension 03/23/2011  . Atrioventricular block, complete ([place]) 03/23/2011  . Complete atrioventricular block ([place]) 03/23/2011    Larae Grooms 10/08/2018, 3:36 PM  [place] MAIN [facility]  SERVICES [address] [place], [place], [address] Phone: [phone]   Fax:  [phone]  Name: BENJAMEN KOELLING MRN: [id number] Date of Birth: October 13, 1931

## 2018-10-09 DIAGNOSIS — H26491 Other secondary cataract, right eye: Secondary | ICD-10-CM | POA: Diagnosis not present

## 2018-10-12 ENCOUNTER — Ambulatory Visit: Payer: Medicare Other

## 2018-10-12 ENCOUNTER — Encounter: Payer: Self-pay | Admitting: Physical Therapy

## 2018-10-12 DIAGNOSIS — M48062 Spinal stenosis, lumbar region with neurogenic claudication: Secondary | ICD-10-CM

## 2018-10-12 DIAGNOSIS — R296 Repeated falls: Secondary | ICD-10-CM

## 2018-10-12 DIAGNOSIS — R42 Dizziness and giddiness: Secondary | ICD-10-CM | POA: Diagnosis not present

## 2018-10-12 DIAGNOSIS — R262 Difficulty in walking, not elsewhere classified: Secondary | ICD-10-CM

## 2018-10-12 NOTE — Therapy (Signed)
[facility] [address] [place], [address] Phone: [phone]   Fax:  [phone]  Physical Therapy Treatment  Patient Details  Name: Howard [name] MRN: [id number] Date of Birth: Apr 18, 1931 Referring Provider (PT): [name]   Encounter Date: 10/12/2018  PT End of Session - 10/12/18 1338    Visit Number  13    Number of Visits  17    Date for PT Re-Evaluation  10/22/18    PT Start Time  1341    PT Stop Time  1425    PT Time Calculation (min)  44 min    Equipment Utilized During Treatment  Gait belt    Activity Tolerance  Patient tolerated treatment well    Behavior During Therapy  [facility] for tasks assessed/performed       Past Medical History:  Diagnosis Date  . Arthritis   . Bell palsy   . Cancer [facility])    prostate and skin  . Chronic combined systolic and diastolic CHF, NYHA class 1 ([place])    a. 07/2014 Echo: EF 35-40%, Gr 1 DD.  Marland Kitchen Complete heart block ([place])    a. 11/2010 s/p SJM 2210 Accent DC PPM, ser# [id number].  . Depression   . Diabetes mellitus without complication ([place])   . Fall 11-10-14  . GERD (gastroesophageal reflux disease)   . History of prostate cancer   . Hyperlipidemia   . Hypertension   . LBBB (left bundle branch block)   . Left-sided Bell's palsy   . Lung cancer ([place]) 2016  . NICM (nonischemic cardiomyopathy) ([place])    a. 07/2014 Echo: EF 35-40%, mid-apicalanteroseptal DK, Gr 1 DD, mild-mod dil LA.  . Non-obstructive CAD    a. 07/2014 Abnl MV;  b. 08/2014 Cath: LM nl, LAD 30p, RI 40p, LCX nl, OM1 40, RCA dominant 30p, 70d-->Med Rx.  Marland Kitchen Poor balance   . Presence of permanent cardiac pacemaker   . Sleep apnea    a. cpap  . Vertigo   . WPW (Wolff-Parkinson-White syndrome)    a. S/P RFCA 1991.    Past Surgical History:  Procedure Laterality Date  . APPLICATION OF WOUND VAC Left 06/07/2015   Procedure: APPLICATION OF WOUND VAC;  Surgeon: Robert Bellow, MD;  Location: [facility] ORS;   Service: General;  Laterality: Left;  left upper back  . BACK SURGERY     2011  . CARDIAC CATHETERIZATION  08/26/2014   Single vessel obstructive CAD  . CARPAL TUNNEL RELEASE  04-04-15   [facility]  . CATARACT EXTRACTION  07-31-11 and 09-18-11  . Catheter ablation  1991   for WPW  . cervical fusion    . CHOLECYSTECTOMY  09-07-14  . HAND SURGERY     right 1993; left 2005  . HERNIA REPAIR  1955  . INSERT / REPLACE / REMOVE PACEMAKER    . JOINT REPLACEMENT Left 2013   knee  . JOINT REPLACEMENT Right 2004   knee  . KNEE SURGERY     left knee 1991 and 1992; right knee 1995  . LEFT HEART CATHETERIZATION WITH CORONARY ANGIOGRAM N/A 08/26/2014   Procedure: LEFT HEART CATHETERIZATION WITH CORONARY ANGIOGRAM;  Surgeon: [name], MD;  Location: [facility] CATH LAB;  Service: Cardiovascular;  Laterality: N/A;  . LUMBAR LAMINECTOMY/DECOMPRESSION MICRODISCECTOMY N/A 06/07/2014   Procedure: LUMBAR FOUR TO FIVE LUMBAR LAMINECTOMY/DECOMPRESSION MICRODISCECTOMY 1 LEVEL;  Surgeon: [name], MD;  Location: [place] [facility] ORS;  Service: Neurosurgery;  Laterality:  N/A;  . LUNG BIOPSY Right 2016   Dr [name]  . PACEMAKER INSERTION     PPM-- St Jude 11/30/10 by [name]  . PROSTATE SURGERY     cancer--1998, prostatectomy  . REPLACEMENT TOTAL KNEE     2004  . ruptured disc     1962 and 1998  . TRIGGER FINGER RELEASE  01-24-15  . WOUND DEBRIDEMENT Left 06/07/2015   Procedure: DEBRIDEMENT WOUND;  Surgeon: Robert Bellow, MD;  Location: [facility] ORS;  Service: General;  Laterality: Left;  left upper back    There were no vitals filed for this visit.  Subjective Assessment - 10/12/18 1343    Subjective  Patient reports that after his last aquatic session he was really "worked out" and had L leg cramps up until today. States that he felt very tired but that it seemed to help, he states he slept better. On friday and saturday he stated he didn't "have to go far for it to lock up" meaning his L leg, but today it is doing really well,      Pertinent History  Patient is a 84 year old male reporting iwth LBP with LE weakness. Patient has hx of spinal stenosis, and and is currently receiving injections for LE weakness. Patient reports he has some L sided LBP in mornings, but his cheif complaint is his bilat LE numbess and weakness that is occuring after he walks for a period of time. Patient [place] and cannot walk up and down his driveway without the legs feeling like they "are going to give out". Pt denies N/V, B&B changes, unexplained weight fluctuation, saddle paresthesia, fever, night sweats, or unrelenting night pain at this time.    Currently in Pain?  No/denies       Ther-Ex 13min on recumbant bike with patient leaning forward, for safe aerobic/muscular endurance activity  Bridge 3x10 with glute set prior to lift and pelvic tilt, verbal/visual/tactile cues needed  Posterior pelvic tilt with demo and max cuing initially and then intermittently for proper form; with good carry over following 3x 10 with 2sec hold  Seated LAQ with 3# AW with focus on eccentric lowering 3x10 b/l cues for slow lowering intermittently  Seated marching with 3# AW with focus on control of motion 2x 10 BLE  Standing HS curl with 3# AW 3x10 b/l with UE support  Standing marching with 3# AW with timer (x87mins) for endurance ith b/l UE support, cues to avoid weight bearing in hands as much as possible. Noticeable fatigue at end of 2 mins and pt complaints of "cramps".   Ambulation outside with gait belt on for endurance training x 2-85mins, uneven surfaces, up hill and down hill. Patient able to complete after recumbent bike, at end of ambulation demonstrated poor LLE control, complaints of muscle "cramping", and locking, CGA throughout activity     PT Education - 10/12/18 1345    Education Details  therapeutic exercises    Person(s) Educated  Patient    Methods  Explanation;Demonstration    Comprehension  Verbalized  understanding;Returned demonstration;Verbal cues required;Tactile cues required       PT Short Term Goals - 09/29/18 1124      PT SHORT TERM GOAL #1   Title  Pt will be independent with HEP in order to improve strength, flexibility, and balance in order to decrease fall risk and improve function at home and work.    Time  4    Period  Weeks    Status  On-going        PT Long Term Goals - 09/29/18 1124      PT LONG TERM GOAL #1   Title  Patient will report ambulation over 10 min without LE numbess/weakness to be able to complete ADLs    Baseline  09/29/18 52min 38sec covering 341ft    Time  8    Period  Weeks    Status  On-going      PT LONG TERM GOAL #2   Title  Pt will decrease 5TSTS by at least 3 seconds in order to demonstrate clinically significant improvement in LE strength    Baseline  09/29/18 14sec    Time  8    Period  Weeks    Status  On-going      PT LONG TERM GOAL #3   Title  Patient will be able to ambulate uphill without LE sx in order to retrieve his morning paper    Baseline  08/28/18: 43ft up and back down (approx 20d grade) with patient reporting "no problem" doing this    Time  8    Period  Weeks    Status  On-going      PT LONG TERM GOAL #4   Title  Pt will increase 10MWT by at least 0.13 m/s in order to demonstrate clinically significant improvement in community ambulation.     Baseline  09/29/18 1.73m/s    Status  On-going      PT LONG TERM GOAL #5   Title  Patient will ambulate 460ft in 2 mins to demonstrate age appropriate distance    Baseline  09/29/18 370ft before needing to sit down with LLE "giving out"            Plan - 10/12/18 1429    Clinical Impression Statement  Patient demonstrated good tolerance to activity today. During ambulation on uneven and even surfaces uphill as well as down hill, patient has most difficulty with LLE eccentric control for down hill, as well as complaints of fatigue uphill. Describes symptoms as "cramping",  with fatigue decrease in LLE control observable. Overall patient demonstrated improved weight bearing/upright tolerance compared to previous session.    Clinical Impairments Affecting Rehab Potential  (+) social support, motivation (-) age, chronicity of pain, other comorbidities, multiple failed attempts at pain relief (injection, sx, past PT)    PT Frequency  2x / week    PT Duration  8 weeks    PT Treatment/Interventions  ADLs/Self Care Home Management;Cryotherapy;Traction;Gait training;Therapeutic exercise;Patient/family education;Manual techniques;Passive range of motion;Dry needling;Taping;Neuromuscular re-education;Balance training;Stair training;Electrical Stimulation;Ultrasound;Aquatic Therapy;Moist Heat;Iontophoresis 4mg /ml Dexamethasone;Therapeutic activities    PT Next Visit Plan  hip/core strengthening, aerobic progression    PT Home Exercise Plan  lower lumbar rotations, seated hamstring stretch, supine glute stretch    Consulted and Agree with Plan of Care  Patient       Patient will benefit from skilled therapeutic intervention in order to improve the following deficits and impairments:  Abnormal gait, Decreased balance, Decreased endurance, Difficulty walking, Decreased range of motion, Improper body mechanics, Impaired tone, Decreased activity tolerance, Decreased strength, Increased fascial restricitons, Impaired flexibility, Postural dysfunction, Pain  Visit Diagnosis: Difficulty in walking, not elsewhere classified  Spinal stenosis of lumbar region with neurogenic claudication  Repeated falls  Dizziness and giddiness     Problem List Patient Active Problem List   Diagnosis Date Noted  . Malignant neoplasm of right lung ([place]) 04/18/2016  . CAD (coronary  artery disease) 04/01/2016  . Adenocarcinoma ([place]) 04/01/2016  . Skin cyst 12/26/2015  . GERD (gastroesophageal reflux disease) 06/16/2015  . Abscess of back 06/06/2015  . Vertigo 03/27/2015  . Carpal tunnel  syndrome 10/28/2014  . Status post cholecystectomy 09/29/2014  . Disease of digestive tract 09/29/2014  . Cardiomyopathy ([place]) 09/26/2014  . Type 2 diabetes mellitus without complications ([place])   . Sleep apnea   . Spinal stenosis, lumbar region, with neurogenic claudication 06/07/2014  . Lumbar stenosis with neurogenic claudication 06/07/2014  . Abnormal gait 08/20/2012  . H/O total knee replacement 08/20/2012  . Arthritis of knee, degenerative 08/20/2012  . Pacemaker-St.Jude 08/03/2012  . Cardiac conduction disorder 06/19/2012  . Acid reflux 06/18/2012  . 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 Triggering of digit 03/25/2012  . Hyperlipidemia 12/23/2011  . Essential hypertension 03/23/2011  . Atrioventricular block, complete ([place]) 03/23/2011  . Complete atrioventricular block ([place]) 03/23/2011    Lieutenant Diego PT, DPT 2:31 PM,10/12/18 [facility] [address] [place], [address] Phone: [phone]   Fax:  [phone]  Name: Howard [name] MRN: [id number] Date of Birth: 01-13-1931

## 2018-10-13 DIAGNOSIS — J330 Polyp of nasal cavity: Secondary | ICD-10-CM | POA: Diagnosis not present

## 2018-10-13 DIAGNOSIS — J320 Chronic maxillary sinusitis: Secondary | ICD-10-CM | POA: Diagnosis not present

## 2018-10-15 ENCOUNTER — Encounter: Payer: Medicare Other | Admitting: Physical Therapy

## 2018-10-15 ENCOUNTER — Ambulatory Visit: Payer: Medicare Other

## 2018-10-15 ENCOUNTER — Telehealth: Payer: Self-pay | Admitting: *Deleted

## 2018-10-19 ENCOUNTER — Ambulatory Visit: Payer: Medicare Other

## 2018-10-19 ENCOUNTER — Encounter: Payer: Self-pay | Admitting: Physical Therapy

## 2018-10-19 DIAGNOSIS — R296 Repeated falls: Secondary | ICD-10-CM | POA: Diagnosis not present

## 2018-10-19 DIAGNOSIS — R262 Difficulty in walking, not elsewhere classified: Secondary | ICD-10-CM | POA: Diagnosis not present

## 2018-10-19 DIAGNOSIS — M48062 Spinal stenosis, lumbar region with neurogenic claudication: Secondary | ICD-10-CM

## 2018-10-19 DIAGNOSIS — R42 Dizziness and giddiness: Secondary | ICD-10-CM | POA: Diagnosis not present

## 2018-10-19 NOTE — Therapy (Signed)
[place] [address], [place], [address] Phone: [phone]   Fax:  [phone]  Physical Therapy Treatment  Patient Details  Name: Howard [name] MRN: [id number] Date of Birth: December 18, 1931 Referring Provider (PT): [name]   Encounter Date: 10/19/2018  PT End of Session - 10/19/18 1115    Visit Number  14    Number of Visits  17    Date for PT Re-Evaluation  10/22/18    PT Start Time  6945    PT Stop Time  1158    PT Time Calculation (min)  42 min    Equipment Utilized During Treatment  Gait belt    Activity Tolerance  Patient tolerated treatment well    Behavior During Therapy  [name] for tasks assessed/performed       Past Medical History:  Diagnosis Date  . Arthritis   . Bell palsy   . Cancer [facility])    prostate and skin  . Chronic combined systolic and diastolic CHF, NYHA class 1 ([place])    a. 07/2014 Echo: EF 35-40%, Gr 1 DD.  Marland Kitchen Complete heart block ([place])    a. 11/2010 s/p SJM 2210 [employer]ent DC PPM, ser# [id number].  . Depression   . Diabetes mellitus without complication ([place])   . Fall 11-10-14  . GERD (gastroesophageal reflux disease)   . History of prostate cancer   . Hyperlipidemia   . Hypertension   . LBBB (left bundle branch block)   . Left-sided Bell's palsy   . Lung cancer ([place]) 2016  . NICM (nonischemic cardiomyopathy) ([place])    a. 07/2014 Echo: EF 35-40%, mid-apicalanteroseptal DK, Gr 1 DD, mild-mod dil LA.  . Non-obstructive CAD    a. 07/2014 Abnl MV;  b. 08/2014 Cath: LM nl, LAD 30p, RI 40p, LCX nl, OM1 40, RCA dominant 30p, 70d-->Med Rx.  Marland Kitchen Poor balance   . Presence of permanent cardiac pacemaker   . Sleep apnea    a. cpap  . Vertigo   . WPW (Wolff-Parkinson-White syndrome)    a. S/P RFCA 1991.    Past Surgical History:  Procedure Laterality Date  . APPLICATION OF WOUND VAC Left 06/07/2015   Procedure: APPLICATION OF WOUND VAC;  Surgeon: Robert Bellow, MD;  Location: [facility] ORS;   Service: General;  Laterality: Left;  left upper back  . BACK SURGERY     2011  . CARDIAC CATHETERIZATION  08/26/2014   Single vessel obstructive CAD  . CARPAL TUNNEL RELEASE  04-04-15   [facility]  . CATARACT EXTRACTION  07-31-11 and 09-18-11  . Catheter ablation  1991   for WPW  . cervical fusion    . CHOLECYSTECTOMY  09-07-14  . HAND SURGERY     right 1993; left 2005  . HERNIA REPAIR  1955  . INSERT / REPLACE / REMOVE PACEMAKER    . JOINT REPLACEMENT Left 2013   knee  . JOINT REPLACEMENT Right 2004   knee  . KNEE SURGERY     left knee 1991 and 1992; right knee 1995  . LEFT HEART CATHETERIZATION WITH CORONARY ANGIOGRAM N/A 08/26/2014   Procedure: LEFT HEART CATHETERIZATION WITH CORONARY ANGIOGRAM;  Surgeon: [name], MD;  Location: [facility] CATH LAB;  Service: Cardiovascular;  Laterality: N/A;  . LUMBAR LAMINECTOMY/DECOMPRESSION MICRODISCECTOMY N/A 06/07/2014   Procedure: LUMBAR FOUR TO FIVE LUMBAR LAMINECTOMY/DECOMPRESSION MICRODISCECTOMY 1 LEVEL;  Surgeon: [name], MD;  Location: [facility] OR[facility];  Service: Neurosurgery;  Laterality:  N/A;  . LUNG BIOPSY Right 2016   Dr [name]  . PACEMAKER INSERTION     PPM-- St Jude 11/30/10 by [name]  . PROSTATE SURGERY     cancer--1998, prostatectomy  . REPLACEMENT TOTAL KNEE     2004  . ruptured disc     1962 and 1998  . TRIGGER FINGER RELEASE  01-24-15  . WOUND DEBRIDEMENT Left 06/07/2015   Procedure: DEBRIDEMENT WOUND;  Surgeon: Robert Bellow, MD;  Location: [facility] ORS;  Service: General;  Laterality: Left;  left upper back    There were no vitals filed for this visit.  Subjective Assessment - 10/19/18 1120    Subjective  Patient reports he had to cancel his aquatic therapy appointment on thursday do to increased L quad complaints. Reports that it was "locking" and cramping a lot last week.     Pertinent History  Patient is a 84 year old male reporting iwth LBP with LE weakness. Patient has hx of spinal stenosis, and and is currently receiving  injections for LE weakness. Patient reports he has some L sided LBP in mornings, but his cheif complaint is his bilat LE numbess and weakness that is occuring after he walks for a period of time. Patient reports [place] and cannot walk up and down his driveway without the legs feeling like they "are going to give out". Pt denies N/V, B&B changes, unexplained weight fluctuation, saddle paresthesia, fever, night sweats, or unrelenting night pain at this time.    Currently in Pain?  No/denies      Ther-Ex 75min on recumbant bike with patient leaning forward, for safe aerobic/muscular endurance activity: 71mins and 6 mins with patient reported increased "cramping"  Bridge3x10 with glute set prior to lift and pelvic tilt, verbal/visual/tactile cues needed Posterior pelvic tilt with demo and max cuing initiallyand then intermittentlyfor proper form; with good carry over following 3x 10 with 2sec hold  S/l femoral nerve glides in different position of hip extension 10-15x with mild complaints of cramping. Unable to qualify symptoms well.  S/l L hip flexor stretch 2x30s with PT assist  Seated LAQwith 3# AWwith focus on eccentric lowering 3x10b/l cues for slow lowering intermittently  Seated marching with 3# AW with focus on control of motion 2x 10 BLE  Standing HS curl with 3# AW 3x10 b/l with UE support  Standing marching with3# AW with timer (x30mins) for endurance ith b/l UE support, cues to avoid weight bearing in hands as much as possible.  Ambulation outside with gait belt and forearm crutch on for endurance training x 2-26mins, uneven surfaces, up hill and down hill. Patient given sitting rest break pre and post ambulation. at end of ambulation demonstrated poor LLE control, complaints of muscle "cramping", and locking, CGA throughout activity     PT Education - 10/19/18 1121    Education Details  therapeutic exercise form/technique    Person(s) Educated  Patient     Methods  Explanation;Demonstration    Comprehension  Verbalized understanding;Returned demonstration       PT Short Term Goals - 09/29/18 1124      PT SHORT TERM GOAL #1   Title  Pt will be independent with HEP in order to improve strength, flexibility, and balance in order to decrease fall risk and improve function at home and work.    Time  4    Period  Weeks    Status  On-going        PT Long  Term Goals - 09/29/18 1124      PT LONG TERM GOAL #1   Title  Patient will report ambulation over 10 min without LE numbess/weakness to be able to complete ADLs    Baseline  09/29/18 84min 38sec covering 348ft    Time  8    Period  Weeks    Status  On-going      PT LONG TERM GOAL #2   Title  Pt will decrease 5TSTS by at least 3 seconds in order to demonstrate clinically significant improvement in LE strength    Baseline  09/29/18 14sec    Time  8    Period  Weeks    Status  On-going      PT LONG TERM GOAL #3   Title  Patient will be able to ambulate uphill without LE sx in order to retrieve his morning paper    Baseline  08/28/18: 57ft up and back down (approx 20d grade) with patient reporting "no problem" doing this    Time  8    Period  Weeks    Status  On-going      PT LONG TERM GOAL #4   Title  Pt will increase 10MWT by at least 0.13 m/s in order to demonstrate clinically significant improvement in community ambulation.     Baseline  09/29/18 1.33m/s    Status  On-going      PT LONG TERM GOAL #5   Title  Patient will ambulate 428ft in 2 mins to demonstrate age appropriate distance    Baseline  09/29/18 35ft before needing to sit down with LLE "giving out"            Plan - 10/19/18 1208    Clinical Impression Statement  Patient had most difficulty with uphill and downhill walking, reported increased weakness 'locking" of LLE. Patient with significant L hip flexor/quad tightness. No complaints of symptoms during supine exercises.    PT Frequency  2x / week    PT Duration   8 weeks    PT Treatment/Interventions  ADLs/Self Care Home Management;Cryotherapy;Traction;Gait training;Therapeutic exercise;Patient/family education;Manual techniques;Passive range of motion;Dry needling;Taping;Neuromuscular re-education;Balance training;Stair training;Electrical Stimulation;Ultrasound;Aquatic Therapy;Moist Heat;Iontophoresis 4mg /ml Dexamethasone;Therapeutic activities    PT Next Visit Plan  hip/core strengthening, aerobic progression    PT Home Exercise Plan  lower lumbar rotations, seated hamstring stretch, supine glute stretch    Consulted and Agree with Plan of Care  Patient       Patient will benefit from skilled therapeutic intervention in order to improve the following deficits and impairments:  Abnormal gait, Decreased balance, Decreased endurance, Difficulty walking, Decreased range of motion, Improper body mechanics, Impaired tone, Decreased activity tolerance, Decreased strength, Increased fascial restricitons, Impaired flexibility, Postural dysfunction, Pain  Visit Diagnosis: Difficulty in walking, not elsewhere classified  Spinal stenosis of lumbar region with neurogenic claudication  Repeated falls     Problem List Patient Active Problem List   Diagnosis Date Noted  . Malignant neoplasm of right lung ([place]) 04/18/2016  . CAD (coronary artery disease) 04/01/2016  . Adenocarcinoma ([place]) 04/01/2016  . Skin cyst 12/26/2015  . GERD (gastroesophageal reflux disease) 06/16/2015  . Abscess of back 06/06/2015  . Vertigo 03/27/2015  . Carpal tunnel syndrome 10/28/2014  . Status post cholecystectomy 09/29/2014  . Disease of digestive tract 09/29/2014  . Cardiomyopathy ([place]) 09/26/2014  . Type 2 diabetes mellitus without complications ([place])   . Sleep apnea   . Spinal stenosis, lumbar region, with neurogenic claudication 06/07/2014  .  Lumbar stenosis with neurogenic claudication 06/07/2014  . Abnormal gait 08/20/2012  . H/O total knee replacement 08/20/2012   . Arthritis of knee, degenerative 08/20/2012  . Pacemaker-St.Jude 08/03/2012  . Cardiac conduction disorder 06/19/2012  . Acid reflux 06/18/2012  . 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 Triggering of digit 03/25/2012  . Hyperlipidemia 12/23/2011  . Essential hypertension 03/23/2011  . Atrioventricular block, complete ([place]) 03/23/2011  . Complete atrioventricular block [facility]) 03/23/2011   Lieutenant Diego PT, DPT 12:59 PM,10/19/18 [place] [facility] [address], [place], [address] Phone: [phone]   Fax:  [phone]  Name: Howard [name] MRN: [id number] Date of Birth: 09/26/1931

## 2018-10-20 ENCOUNTER — Ambulatory Visit (INDEPENDENT_AMBULATORY_CARE_PROVIDER_SITE_OTHER): Payer: Medicare Other | Admitting: Internal Medicine

## 2018-10-20 ENCOUNTER — Encounter: Payer: Self-pay | Admitting: Internal Medicine

## 2018-10-20 VITALS — BP 138/66 | HR 60 | Ht 69.0 in | Wt 196.8 lb

## 2018-10-20 DIAGNOSIS — I25118 Atherosclerotic heart disease of native coronary artery with other forms of angina pectoris: Secondary | ICD-10-CM

## 2018-10-20 DIAGNOSIS — I428 Other cardiomyopathies: Secondary | ICD-10-CM | POA: Diagnosis not present

## 2018-10-20 DIAGNOSIS — Z950 Presence of cardiac pacemaker: Secondary | ICD-10-CM | POA: Diagnosis not present

## 2018-10-20 DIAGNOSIS — I442 Atrioventricular block, complete: Secondary | ICD-10-CM

## 2018-10-20 DIAGNOSIS — Z79899 Other long term (current) drug therapy: Secondary | ICD-10-CM

## 2018-10-20 LAB — CUP PACEART INCLINIC DEVICE CHECK
Battery Remaining Longevity: 9 mo
Battery Voltage: 2.71 V
Brady Statistic RA Percent Paced: 2.9 %
Brady Statistic RV Percent Paced: 99.41 %
Date Time Interrogation Session: 20191029143542
Implantable Lead Implant Date: 20111208
Implantable Lead Location: 753859
Implantable Lead Model: 1948
Lead Channel Impedance Value: 362.5 Ohm
Lead Channel Impedance Value: 412.5 Ohm
Lead Channel Pacing Threshold Amplitude: 0.75 V
Lead Channel Pacing Threshold Pulse Width: 0.4 ms
Lead Channel Sensing Intrinsic Amplitude: 12 mV
Lead Channel Sensing Intrinsic Amplitude: 3 mV
MDC IDC LEAD IMPLANT DT: 20111208
MDC IDC LEAD LOCATION: 753860
MDC IDC MSMT LEADCHNL RV PACING THRESHOLD AMPLITUDE: 1.25 V
MDC IDC MSMT LEADCHNL RV PACING THRESHOLD PULSEWIDTH: 0.8 ms
MDC IDC PG IMPLANT DT: 20111208
MDC IDC SET LEADCHNL RA PACING AMPLITUDE: 2 V
MDC IDC SET LEADCHNL RV PACING AMPLITUDE: 2.5 V
MDC IDC SET LEADCHNL RV PACING PULSEWIDTH: 0.8 ms
MDC IDC SET LEADCHNL RV SENSING SENSITIVITY: 4 mV
Pulse Gen Serial Number: 7196739

## 2018-10-20 MED ORDER — METOPROLOL SUCCINATE ER 25 MG PO TB24: ORAL_TABLET | ORAL | Status: DC

## 2018-10-20 NOTE — Progress Notes (Signed)
,      Patient Care Team: Chrismon, [name], PA as PCP - General (Family Medicine) [name], MD as Consulting Physician (Cardiology) [name], MD as Consulting Physician (Cardiology) Dasher, [name], MD as Consulting Physician (Dermatology) Idelle Crouch [name], Student-RN as Student Nurse (Student) Birder Robson, MD as Referring Physician (Ophthalmology) [name], MD as Consulting Physician (Vascular Surgery)   HPI  Howard [name] is a 84 y.o. male Senior pacemaker follow-up for device implanted for complete heart block 12/11.  He has been followed by Dr. Rayann Heman and [name].  He has nonobstructive coronary disease with a modest nonischemic cardiomyopathy  DATE TEST EF   8/15 Echo  35-40 %   5/16 Echo  45-50 %   6/19 Echo 35-40%   8/19 MYOVIEW 33%    Date Cr K Hgb  9/19 0.85 5.2 14.5         Quite tired.  He is urinating at night as he is taking his Aldactone at night.  Denies chest pain.  Mild shortness of breath.  No peripheral edema.  Records and Results Reviewed  Past Medical History:  Diagnosis Date  . Arthritis   . Bell palsy   . Cancer [facility])    prostate and skin  . Chronic combined systolic and diastolic CHF, NYHA class 1 ([place])    a. 07/2014 Echo: EF 35-40%, Gr 1 DD.  Marland Kitchen Complete heart block ([place])    a. 11/2010 s/p SJM 2210 Accent DC PPM, ser# [id number].  . Depression   . Diabetes mellitus without complication ([place])   . Fall 11-10-14  . GERD (gastroesophageal reflux disease)   . History of prostate cancer   . Hyperlipidemia   . Hypertension   . LBBB (left bundle branch block)   . Left-sided Bell's palsy   . Lung cancer ([place]) 2016  . NICM (nonischemic cardiomyopathy) ([place])    a. 07/2014 Echo: EF 35-40%, mid-apicalanteroseptal DK, Gr 1 DD, mild-mod dil LA.  . Non-obstructive CAD    a. 07/2014 Abnl MV;  b. 08/2014 Cath: LM nl, LAD 30p, RI 40p, LCX nl, OM1 40, RCA dominant 30p, 70d-->Med Rx.  Marland Kitchen Poor balance   . Presence of  permanent cardiac pacemaker   . Sleep apnea    a. cpap  . Vertigo   . WPW (Wolff-Parkinson-White syndrome)    a. S/P RFCA 1991.    Past Surgical History:  Procedure Laterality Date  . APPLICATION OF WOUND VAC Left 06/07/2015   Procedure: APPLICATION OF WOUND VAC;  Surgeon: Robert Bellow, MD;  Location: [facility] ORS;  Service: General;  Laterality: Left;  left upper back  . BACK SURGERY     2011  . CARDIAC CATHETERIZATION  08/26/2014   Single vessel obstructive CAD  . CARPAL TUNNEL RELEASE  04-04-15   [facility]  . CATARACT EXTRACTION  07-31-11 and 09-18-11  . Catheter ablation  1991   for WPW  . cervical fusion    . CHOLECYSTECTOMY  09-07-14  . HAND SURGERY     right 1993; left 2005  . HERNIA REPAIR  1955  . INSERT / REPLACE / REMOVE PACEMAKER    . JOINT REPLACEMENT Left 2013   knee  . JOINT REPLACEMENT Right 2004   knee  . KNEE SURGERY     left knee 1991 and 1992; right knee 1995  . LEFT HEART CATHETERIZATION WITH CORONARY ANGIOGRAM N/A 08/26/2014   Procedure: LEFT HEART CATHETERIZATION WITH CORONARY ANGIOGRAM;  Surgeon: [name],  MD;  Location: [facility] CATH LAB;  Service: Cardiovascular;  Laterality: N/A;  . LUMBAR LAMINECTOMY/DECOMPRESSION MICRODISCECTOMY N/A 06/07/2014   Procedure: LUMBAR FOUR TO FIVE LUMBAR LAMINECTOMY/DECOMPRESSION MICRODISCECTOMY 1 LEVEL;  Surgeon: [name], MD;  Location: [place] NEURO ORS;  Service: Neurosurgery;  Laterality: N/A;  . LUNG BIOPSY Right 2016   Dr [name]  . PACEMAKER INSERTION     PPM-- St Jude 11/30/10 by [name]  . PROSTATE SURGERY     cancer--1998, prostatectomy  . REPLACEMENT TOTAL KNEE     2004  . ruptured disc     1962 and 1998  . TRIGGER FINGER RELEASE  01-24-15  . WOUND DEBRIDEMENT Left 06/07/2015   Procedure: DEBRIDEMENT WOUND;  Surgeon: Robert Bellow, MD;  Location: [facility] ORS;  Service: General;  Laterality: Left;  left upper back    Current Outpatient Medications  Medication Sig Dispense Refill  . albuterol (PROVENTIL HFA;VENTOLIN HFA)  108 (90 Base) MCG/ACT inhaler Inhale 1-2 puffs into the lungs every 6 (six) hours as needed for wheezing or shortness of breath. 1 Inhaler 0  . aspirin 81 MG tablet Take 81 mg by mouth daily.      . Cholecalciferol (VITAMIN D3) 1000 UNITS CAPS Take 1,000 Units by mouth daily.     . hydrochlorothiazide (HYDRODIURIL) 25 MG tablet TAKE 1 TABLET DAILY 90 tablet 3  . metFORMIN (GLUCOPHAGE) 500 MG tablet TAKE 1 TABLET TWICE A DAY  WITH MEALS 180 tablet 3  . metoprolol succinate (TOPROL-XL) 25 MG 24 hr tablet Take 1 tablet (25 mg) by mouth once daily at bedtime    . Multiple Vitamin (MULTIVITAMIN) capsule Take 1 capsule by mouth daily.      Marland Kitchen omeprazole (PRILOSEC) 40 MG capsule Take 1 capsule (40 mg total) by mouth daily. 90 capsule 3  . psyllium (METAMUCIL) 58.6 % powder Take 1 packet by mouth 3 (three) times daily.    . sacubitril-valsartan (ENTRESTO) 97-103 MG Take 1 tablet by mouth 2 (two) times daily. 180 tablet 3  . simvastatin (ZOCOR) 40 MG tablet TAKE 1 TABLET AT BEDTIME 90 tablet 3  . spironolactone (ALDACTONE) 50 MG tablet Take 1/2 tablet (25 mg) by mouth once daily in the morning     No current facility-administered medications for this visit.     Allergies  Allergen Reactions  . Sulfa Antibiotics Rash  . Sulfonamide Derivatives Rash      Review of Systems negative except from HPI and PMH  Physical Exam BP 138/66   Pulse 60   Ht 5\' 9"  (1.753 m)   Wt 196 lb 12 oz (89.2 kg)   BMI 29.05 kg/m  Well developed and nourished in no acute distress HENT normal Neck supple with JVP-flat Carotids brisk and full without bruits Clear Regular rate and rhythm, no murmurs or gallops Abd-soft with active BS without hepatomegaly No Clubbing cyanosis edema Skin-warm and dry A & Oriented  Grossly normal sensory and motor function    ECG snus w P-synchronous/ AV  pacing   Assessment and  Plan Nonischemic cardiomyopathy  Complete heart block  Pacemaker-Saint Jude The patient's  device was interrogated.  The information was reviewed. No changes were made in the programming.    Battery is approaching ERI  Congestive heart failure class 3  Hypertension  Hyperkalemia   We will recheck his metabolic profile now on the Aldactone 6 weeks in.  We will have him take it in the morning so as to hopefully decrease his nocturnal urination and improve  his rest.  Have him take his metoprolol at night.  This may help his fatigue   Blood pressures are not reasonably controlled.  The 130-160 range at home.  Following laboratory evaluation will make a decision as to what we could do to perhaps improve blood pressure control     Current medicines are reviewed at length with the patient today .  The patient does not  have concerns regarding medicines.

## 2018-10-20 NOTE — Patient Instructions (Signed)
Medication Instructions:  - Your physician has recommended you make the following change in your medication:   1) take metoprolol succinate 25 mg once daily at BEDTIME  2) take spironolactone 50 mg- 1/2 tablet (25 mg) once daily in the morning  If you need a refill on your cardiac medications before your next appointment, please call your pharmacy.   Lab work: - Your physician recommends that you have lab work today: BMP  If you have labs (blood work) drawn today and your tests are completely normal, you will receive your results only by: Marland Kitchen MyChart Message (if you have MyChart) OR . A paper copy in the mail If you have any lab test that is abnormal or we need to change your treatment, we will call you to review the results.  Testing/Procedures: - none ordered  Follow-Up: At [facility], you and your health needs are our priority.  As part of our continuing mission to provide you with exceptional heart care, we have created designated Provider Care Teams.  These Care Teams include your primary Cardiologist (physician) and Advanced Practice Providers (APPs -  Physician Assistants and Nurse Practitioners) who all work together to provide you with the care you need, when you need it. . You will need a follow up appointment in 1 year with Dr. Caryl Comes.  Please call our office 2 months in advance to schedule this appointment.  Remote monitoring is used to monitor your Pacemaker of ICD from home. This monitoring reduces the number of office visits required to check your device to one time per year. It allows [place] to keep an eye on the functioning of your device to ensure it is working properly. You are scheduled for a device check from home on 12/30/18. You may send your transmission at any time that day. If you have a wireless device, the transmission will be sent automatically. After your physician reviews your transmission, you will receive a postcard with your next transmission date.   Any Other  Special Instructions Will Be Listed Below (If Applicable). - N/A

## 2018-10-21 LAB — BASIC METABOLIC PANEL
BUN/Creatinine Ratio: 18 (ref 10–24)
BUN: 15 mg/dL (ref 8–27)
CALCIUM: 9.1 mg/dL (ref 8.6–10.2)
CHLORIDE: 90 mmol/L — AB (ref 96–106)
CO2: 20 mmol/L (ref 20–29)
Creatinine, Ser: 0.82 mg/dL (ref 0.76–1.27)
GFR calc non Af Amer: 81 mL/min/{1.73_m2} (ref >59)
GFR, EST AFRICAN AMERICAN: 93 mL/min/{1.73_m2} (ref >59)
GLUCOSE: 122 mg/dL — AB (ref 65–99)
Potassium: 4.7 mmol/L (ref 3.5–5.2)
Sodium: 128 mmol/L — ABNORMAL LOW (ref 134–144)

## 2018-10-21 NOTE — Addendum Note (Signed)
Addended by: Alba Destine on: 10/21/2018 11:29 AM   Modules accepted: Orders

## 2018-10-22 ENCOUNTER — Other Ambulatory Visit: Payer: Self-pay

## 2018-10-22 ENCOUNTER — Telehealth: Payer: Self-pay | Admitting: Cardiovascular Disease

## 2018-10-22 ENCOUNTER — Ambulatory Visit: Payer: Medicare Other

## 2018-10-22 DIAGNOSIS — R42 Dizziness and giddiness: Secondary | ICD-10-CM | POA: Diagnosis not present

## 2018-10-22 DIAGNOSIS — M48062 Spinal stenosis, lumbar region with neurogenic claudication: Secondary | ICD-10-CM | POA: Diagnosis not present

## 2018-10-22 DIAGNOSIS — R296 Repeated falls: Secondary | ICD-10-CM | POA: Diagnosis not present

## 2018-10-22 DIAGNOSIS — R262 Difficulty in walking, not elsewhere classified: Secondary | ICD-10-CM | POA: Diagnosis not present

## 2018-10-22 NOTE — Telephone Encounter (Signed)
Dr. [name],  Please review-   Patient was started on aldactone 50 mg- 1/2 tablet once daily by you. He saw Dr. Caryl Comes on 10/29- BP readings from home variable between 721-828 systolic.  BMP shows hyponatremia, but K+ ok.  Dr. Caryl Comes recommended stopping HCTZ for hyponatremia and getting your recommendations for his pressures  Please review.

## 2018-10-22 NOTE — Therapy (Signed)
[place] MAIN [facility] SERVICES [address] [place], [place], [address] Phone: [phone]   Fax:  [phone]  Physical Therapy Treatment  Patient Details  Name: Howard [name] MRN: [id number] Date of Birth: [birth year] Referring Provider (PT): [name]   Encounter Date: 10/22/2018  PT End of Session - 10/22/18 1254    Visit Number  15    Number of Visits  17    Date for PT Re-Evaluation  10/22/18    PT Start Time  0840    PT Stop Time  0930    PT Time Calculation (min)  50 min    Activity Tolerance  Patient tolerated treatment well    Behavior During Therapy  [facility] for tasks assessed/performed       Past Medical History:  Diagnosis Date  . Arthritis   . Bell palsy   . Cancer [facility])    prostate and skin  . Chronic combined systolic and diastolic CHF, NYHA class 1 ([place])    a. 07/2014 Echo: EF 35-40%, Gr 1 DD.  Marland Kitchen Complete heart block ([place])    a. 11/2010 s/p SJM 2210 Accent DC PPM, ser# [id number].  . Depression   . Diabetes mellitus without complication ([place])   . Fall 11-10-14  . GERD (gastroesophageal reflux disease)   . History of prostate cancer   . Hyperlipidemia   . Hypertension   . LBBB (left bundle branch block)   . Left-sided Bell's palsy   . Lung cancer ([place]) 2016  . NICM (nonischemic cardiomyopathy) ([place])    a. 07/2014 Echo: EF 35-40%, mid-apicalanteroseptal DK, Gr 1 DD, mild-mod dil LA.  . Non-obstructive CAD    a. 07/2014 Abnl MV;  b. 08/2014 Cath: LM nl, LAD 30p, RI 40p, LCX nl, OM1 40, RCA dominant 30p, 70d-->Med Rx.  Marland Kitchen Poor balance   . Presence of permanent cardiac pacemaker   . Sleep apnea    a. cpap  . Vertigo   . WPW (Wolff-Parkinson-White syndrome)    a. S/P RFCA 1991.    Past Surgical History:  Procedure Laterality Date  . APPLICATION OF WOUND VAC Left 06/07/2015   Procedure: APPLICATION OF WOUND VAC;  Surgeon: Robert Bellow, MD;  Location: [facility] ORS;  Service: General;  Laterality: Left;  left upper back  .  BACK SURGERY     2011  . CARDIAC CATHETERIZATION  08/26/2014   Single vessel obstructive CAD  . CARPAL TUNNEL RELEASE  04-04-15   [facility]  . CATARACT EXTRACTION  07-31-11 and 09-18-11  . Catheter ablation  1991   for WPW  . cervical fusion    . CHOLECYSTECTOMY  09-07-14  . HAND SURGERY     right 1993; left 2005  . HERNIA REPAIR  1955  . INSERT / REPLACE / REMOVE PACEMAKER    . JOINT REPLACEMENT Left 2013   knee  . JOINT REPLACEMENT Right 2004   knee  . KNEE SURGERY     left knee 1991 and 1992; right knee 1995  . LEFT HEART CATHETERIZATION WITH CORONARY ANGIOGRAM N/A 08/26/2014   Procedure: LEFT HEART CATHETERIZATION WITH CORONARY ANGIOGRAM;  Surgeon: [name], MD;  Location: [facility] CATH LAB;  Service: Cardiovascular;  Laterality: N/A;  . LUMBAR LAMINECTOMY/DECOMPRESSION MICRODISCECTOMY N/A 06/07/2014   Procedure: LUMBAR FOUR TO FIVE LUMBAR LAMINECTOMY/DECOMPRESSION MICRODISCECTOMY 1 LEVEL;  Surgeon: [name], MD;  Location: [place] NEURO ORS;  Service: Neurosurgery;  Laterality: N/A;  . LUNG BIOPSY Right 2016   Dr [name]  .  PACEMAKER INSERTION     PPM-- St Jude 11/30/10 by [name]  . PROSTATE SURGERY     cancer--1998, prostatectomy  . REPLACEMENT TOTAL KNEE     2004  . ruptured disc     1962 and 1998  . TRIGGER FINGER RELEASE  01-24-15  . WOUND DEBRIDEMENT Left 06/07/2015   Procedure: DEBRIDEMENT WOUND;  Surgeon: Robert Bellow, MD;  Location: [facility] ORS;  Service: General;  Laterality: Left;  left upper back    There were no vitals filed for this visit.  Subjective Assessment - 10/22/18 1248    Subjective  Pt reports having difficulty for 3 days post last aquatic session with increased cramping in L quad (felt he aggravated nerves in his back). In reviewing last aquatic session, we agreed the only real change in activity was attempted supine passive lateral trunk stretching, which pt did have difficulty with not fighting sway to stretch often using all core muscles to change flow despite cues.  Afraid pt may have increased muscle guarding/spasm. Agreed to defer this activity and see how pt responds. Of note, pt did participate in that activity several sessions ago with positive reult.     Pertinent History  Patient is a 84 year old male reporting iwth LBP with LE weakness. Patient has hx of spinal stenosis, and and is currently receiving injections for LE weakness. Patient reports he has some L sided LBP in mornings, but his cheif complaint is his bilat LE numbess and weakness that is occuring after he walks for a period of time. Patient [place] and cannot walk up and down his driveway without the legs feeling like they "are going to give out". Pt denies N/V, B&B changes, unexplained weight fluctuation, saddle paresthesia, fever, night sweats, or unrelenting night pain at this time.      Enters/exits via ramp  Ambulation warm up, blue dumbbells (encourage upright posture  8 L fwd'   8 L side  Core with LE strength  B hip circles, slow speed 10x ea cw/ccw  slow hip flexion with increased speed ext, B 20x ea   Squats 25x  Core with UE work  Wall sit (sh depth), Mitts, 25x ea   Sh abd/add   Sh flex/ext   Sh horiz abd/add  Side step resisted walk with Mitts (upright posture) 2 L  Back range/posture work (Tai Chi/Chi gong)  [place] the table side and fwd, 10 min (requires re instruction; unable to slow even with tactile assist)  Active stretching, 3 x 10 sec each, B  lateral trunk flexion with ipsilateral fwd crossed LE  Hamstrings/gastroc  Hip flexor/quad  LB center, R, center, L with LB gentle sweeps                         PT Education - 10/22/18 1253    Education Details  Stand lateral trunk stretching. Polishing the table side and fwd, Continued on slowing motions through water when instructed     Person(s) Educated  Patient    Methods  Explanation;Demonstration    Comprehension  Verbalized understanding;Returned  demonstration;Verbal cues required;Tactile cues required;Need further instruction       PT Short Term Goals - 09/29/18 1124      PT SHORT TERM GOAL #1   Title  Pt will be independent with HEP in order to improve strength, flexibility, and balance in order to decrease fall risk and improve function at home and work.  Time  4    Period  Weeks    Status  On-going        PT Long Term Goals - 09/29/18 1124      PT LONG TERM GOAL #1   Title  Patient will report ambulation over 10 min without LE numbess/weakness to be able to complete ADLs    Baseline  09/29/18 39mn 38sec covering 3535f   Time  8    Period  Weeks    Status  On-going      PT LONG TERM GOAL #2   Title  Pt will decrease 5TSTS by at least 3 seconds in order to demonstrate clinically significant improvement in LE strength    Baseline  09/29/18 14sec    Time  8    Period  Weeks    Status  On-going      PT LONG TERM GOAL #3   Title  Patient will be able to ambulate uphill without LE sx in order to retrieve his morning paper    Baseline  08/28/18: 158fp and back down (approx 20d grade) with patient reporting "no problem" doing this    Time  8    Period  Weeks    Status  On-going      PT LONG TERM GOAL #4   Title  Pt will increase 10MWT by at least 0.13 m/s in order to demonstrate clinically significant improvement in community ambulation.     Baseline  09/29/18 1.50m81m  Status  On-going      PT LONG TERM GOAL #5   Title  Patient will ambulate 492ft59f2 mins to demonstrate age appropriate distance    Baseline  09/29/18 350ft 75fre needing to sit down with LLE "giving out"            Plan - 10/22/18 1255    Clinical Impression Statement  Pt continues to have difficulty slowing movement with focus on core and upright alignment allowing use of buoyancy to assist versus resistance in water from increased speed despite cueing. Continued with LE primarily posterior chain strengthening and core strengthening and  active stretching throughout entire core.    PT Frequency  2x / week    PT Duration  8 weeks    PT Treatment/Interventions  ADLs/Self Care Home Management;Cryotherapy;Traction;Gait training;Therapeutic exercise;Patient/family education;Manual techniques;Passive range of motion;Dry needling;Taping;Neuromuscular re-education;Balance training;Stair training;Electrical Stimulation;Ultrasound;Aquatic Therapy;Moist Heat;Iontophoresis '4mg'$ /ml Dexamethasone;Therapeutic activities    PT Next Visit Plan  hip/core strengthening, aerobic progression    PT Home Exercise Plan  lower lumbar rotations, seated hamstring stretch, supine glute stretch    Consulted and Agree with Plan of Care  Patient       Patient will benefit from skilled therapeutic intervention in order to improve the following deficits and impairments:  Abnormal gait, Decreased balance, Decreased endurance, Difficulty walking, Decreased range of motion, Improper body mechanics, Impaired tone, Decreased activity tolerance, Decreased strength, Increased fascial restricitons, Impaired flexibility, Postural dysfunction, Pain  Visit Diagnosis: Difficulty in walking, not elsewhere classified  Spinal stenosis of lumbar region with neurogenic claudication  Repeated falls  Dizziness and giddiness     Problem List Patient Active Problem List   Diagnosis Date Noted  . Malignant neoplasm of right lung (HCC) 0[place]7/2017  . CAD (coronary artery disease) 04/01/2016  . Adenocarcinoma (HCC) 0[place]0/2017  . Skin cyst 12/26/2015  . GERD (gastroesophageal reflux disease) 06/16/2015  . Abscess of back 06/06/2015  . Vertigo 03/27/2015  . Carpal tunnel syndrome 10/28/2014  .  Status post cholecystectomy 09/29/2014  . Disease of digestive tract 09/29/2014  . Cardiomyopathy ([place]) 09/26/2014  . Type 2 diabetes mellitus without complications ([place])   . Sleep apnea   . Spinal stenosis, lumbar region, with neurogenic claudication 06/07/2014  . Lumbar  stenosis with neurogenic claudication 06/07/2014  . Abnormal gait 08/20/2012  . H/O total knee replacement 08/20/2012  . Arthritis of knee, degenerative 08/20/2012  . Pacemaker-St.Jude 08/03/2012  . Cardiac conduction disorder 06/19/2012  . Acid reflux 06/18/2012  . 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 Triggering of digit 03/25/2012  . Hyperlipidemia 12/23/2011  . Essential hypertension 03/23/2011  . Atrioventricular block, complete ([place]) 03/23/2011  . Complete atrioventricular block ([place]) 03/23/2011    Larae Grooms 10/22/2018, 12:59 PM  [place] MAIN [facility] SERVICES [address] [place], [place], [address] Phone: [phone]   Fax:  [phone]  Name: Howard [name] MRN: [id number] Date of Birth: 10-07-1931

## 2018-10-23 ENCOUNTER — Encounter: Payer: Medicare Other | Admitting: Physical Therapy

## 2018-10-23 NOTE — Telephone Encounter (Signed)
Patient's wife notified and she verbalized understanding to stop the HCTZ. Advised we will let her know if Dr [name] makes any other recommendations. Med list updated. Awaiting Dr Donivan Scull review.

## 2018-10-24 NOTE — Telephone Encounter (Signed)
Can he call with blood pressure measurements before adding new prescription

## 2018-10-26 ENCOUNTER — Encounter: Payer: Self-pay | Admitting: Physical Therapy

## 2018-10-26 ENCOUNTER — Ambulatory Visit: Payer: Medicare Other | Attending: Cardiovascular Disease | Admitting: Physical Therapy

## 2018-10-26 DIAGNOSIS — M6281 Muscle weakness (generalized): Secondary | ICD-10-CM | POA: Diagnosis not present

## 2018-10-26 DIAGNOSIS — R262 Difficulty in walking, not elsewhere classified: Secondary | ICD-10-CM | POA: Diagnosis not present

## 2018-10-26 NOTE — Telephone Encounter (Signed)
Spoke with patient's wife ok per DPR. When they saw Dr Caryl Comes last week, they left a list of BP's with him. Patient has recorded BP 10/29 147/83          10/31 132/61 Patient did not write down the heart rates. Advised wife to have patient record BP/HR over the next week, then call Friday or first of next week with the recordings.  Then at that point, once Dr [name] reviews, he will make any necessary adjustments.  She verbalized understanding of the plan.

## 2018-10-26 NOTE — Therapy (Addendum)
[facility] [address], [place], [address] Phone: [phone]   Fax:  [phone]  Physical Therapy Treatment  Patient Details  Name: Howard [name] MRN: [id number] Date of Birth: 12/29/1930 Referring Provider (PT): [name]   Encounter Date: 10/26/2018  PT End of Session - 10/26/18 1424    Visit Number  16    Number of Visits  17    Date for PT Re-Evaluation  10/22/18    PT Start Time  0100    PT Stop Time  0140    PT Time Calculation (min)  40 min    Activity Tolerance  Patient tolerated treatment well    Behavior During Therapy  [facility] for tasks assessed/performed       Past Medical History:  Diagnosis Date  . Arthritis   . Bell palsy   . Cancer [facility])    prostate and skin  . Chronic combined systolic and diastolic CHF, NYHA class 1 ([place])    a. 07/2014 Echo: EF 35-40%, Gr 1 DD.  Marland Kitchen Complete heart block ([place])    a. 11/2010 s/p SJM 2210 Accent DC PPM, ser# [id number].  . Depression   . Diabetes mellitus without complication ([place])   . Fall 11-10-14  . GERD (gastroesophageal reflux disease)   . History of prostate cancer   . Hyperlipidemia   . Hypertension   . LBBB (left bundle branch block)   . Left-sided Bell's palsy   . Lung cancer ([place]) 2016  . NICM (nonischemic cardiomyopathy) ([place])    a. 07/2014 Echo: EF 35-40%, mid-apicalanteroseptal DK, Gr 1 DD, mild-mod dil LA.  . Non-obstructive CAD    a. 07/2014 Abnl MV;  b. 08/2014 Cath: LM nl, LAD 30p, RI 40p, LCX nl, OM1 40, RCA dominant 30p, 70d-->Med Rx.  Marland Kitchen Poor balance   . Presence of permanent cardiac pacemaker   . Sleep apnea    a. cpap  . Vertigo   . WPW (Wolff-Parkinson-White syndrome)    a. S/P RFCA 1991.    Past Surgical History:  Procedure Laterality Date  . APPLICATION OF WOUND VAC Left 06/07/2015   Procedure: APPLICATION OF WOUND VAC;  Surgeon: Robert Bellow, MD;  Location: [facility] ORS;  Service: General;  Laterality: Left;  left upper  back  . BACK SURGERY     2011  . CARDIAC CATHETERIZATION  08/26/2014   Single vessel obstructive CAD  . CARPAL TUNNEL RELEASE  04-04-15   [facility]  . CATARACT EXTRACTION  07-31-11 and 09-18-11  . Catheter ablation  1991   for WPW  . cervical fusion    . CHOLECYSTECTOMY  09-07-14  . HAND SURGERY     right 1993; left 2005  . HERNIA REPAIR  1955  . INSERT / REPLACE / REMOVE PACEMAKER    . JOINT REPLACEMENT Left 2013   knee  . JOINT REPLACEMENT Right 2004   knee  . KNEE SURGERY     left knee 1991 and 1992; right knee 1995  . LEFT HEART CATHETERIZATION WITH CORONARY ANGIOGRAM N/A 08/26/2014   Procedure: LEFT HEART CATHETERIZATION WITH CORONARY ANGIOGRAM;  Surgeon: [name], MD;  Location: [facility] CATH LAB;  Service: Cardiovascular;  Laterality: N/A;  . LUMBAR LAMINECTOMY/DECOMPRESSION MICRODISCECTOMY N/A 06/07/2014   Procedure: LUMBAR FOUR TO FIVE LUMBAR LAMINECTOMY/DECOMPRESSION MICRODISCECTOMY 1 LEVEL;  Surgeon: [name], MD;  Location: [facility] NEURO ORS;  Service: Neurosurgery;  Laterality: N/A;  . LUNG BIOPSY Right 2016   Dr  Melrose     PPM-- St Jude 11/30/10 by [name]  . PROSTATE SURGERY     cancer--1998, prostatectomy  . REPLACEMENT TOTAL KNEE     2004  . ruptured disc     1962 and 1998  . TRIGGER FINGER RELEASE  01-24-15  . WOUND DEBRIDEMENT Left 06/07/2015   Procedure: DEBRIDEMENT WOUND;  Surgeon: Robert Bellow, MD;  Location: [facility] ORS;  Service: General;  Laterality: Left;  left upper back    There were no vitals filed for this visit.  Subjective Assessment - 10/26/18 1303    Subjective  Patient reports since beginning PT he is having some very good days, but still has days where the L leg "locks up". Patient reports he is going to talk to his "spine doctor" Wednesday to get his opinion on potentially getting "another shot".     Pertinent History  Patient is a 84 year old male reporting iwth LBP with LE weakness. Patient has hx of spinal stenosis, and and is  currently receiving injections for LE weakness. Patient reports he has some L sided LBP in mornings, but his cheif complaint is his bilat LE numbess and weakness that is occuring after he walks for a period of time. Patient [place] and cannot walk up and down his driveway without the legs feeling like they "are going to give out". Pt denies N/V, B&B changes, unexplained weight fluctuation, saddle paresthesia, fever, night sweats, or unrelenting night pain at this time.    How long can you sit comfortably?  unlimited    How long can you stand comfortably?  66mins    How long can you walk comfortably?  2 min    Patient Stated Goals  Walk for extended period of time, walk up/down the driveway    Pain Onset  Other (comment)           Ther-Ex - 10MWT for gait speed where patient is demonstrating similar gait speed from before but reports no symtpoms -Attempted 2 min walk test, initially where patient had a "calf cramp", following stretching patient is able to complete 332ft in 2 mins; continuing to 58ft 37min 40sec - 5x STS over 3 trials with best time 12sec with min UE use - Attempted mini squat from chair- patient unable to complete with proper form; modified to mini squat at counter with chair behind for safety x10 with demo and cuing initially for proper form with good carry over following (HEP) - Standing abd at counter top x10 each direction with good form following PT cuing but not challenging; with yellow tband 2x 10 which patient requires cuing initially for eccentric control with good carry over - Standing hip ext with yellow tband 3x 10  - Standing heel raise x10 with min cuing for eccentric control (HEP) - Visual review of posterior pelvic tilt with TA marching                   PT Education - 10/26/18 1423    Education Details  DC recommendations, HEP recommendations    Person(s) Educated  Patient    Methods   Explanation;Demonstration;Handout;Verbal cues    Comprehension  Verbalized understanding;Returned demonstration;Verbal cues required       PT Short Term Goals - 10/26/18 1425      PT SHORT TERM GOAL #1   Title  Pt will be independent with HEP in order to improve strength, flexibility, and balance in order  to decrease fall risk and improve function at home and work.    Time  4    Period  Weeks    Status  Achieved        PT Long Term Goals - 10/26/18 1305      PT LONG TERM GOAL #1   Title  Patient will report ambulation over 10 min without LE numbess/weakness to be able to complete ADLs    Time  8    Period  Weeks    Status  Deferred      PT LONG TERM GOAL #2   Title  Pt will decrease 5TSTS by at least 3 seconds in order to demonstrate clinically significant improvement in LE strength    Baseline  10/26/18 12sec    Time  8    Period  Weeks    Status  Achieved      PT LONG TERM GOAL #3   Title  Patient will be able to ambulate uphill without LE sx in order to retrieve his morning paper    Baseline  11/419: 27ft up and back down (approx 20d grade) with patient reporting "no problem" doing this, reports he is able to complete at home, but has put a chair half way incase he needs to rest    Status  Achieved      PT LONG TERM GOAL #4   Title  Pt will increase 10MWT by at least 0.13 m/s in order to demonstrate clinically significant improvement in community ambulation.     Baseline  10/26/18 1.3m/s    Time  8    Period  Weeks    Status  On-going      PT LONG TERM GOAL #5   Title  Patient will ambulate 418ft in 2 mins to demonstrate age appropriate distance    Baseline  10/26/18 324ft before needing to sit down with LLE "giving out"; able to complete 575ft 12min 4sec    Time  8    Period  Weeks    Status  On-going            Plan - 10/26/18 1447    Clinical Impression Statement  Patient has made strong progress with LE strengthening, but is continuing to have neurological  issues inhibiting progress with prolonged walking. This has gotten better, with increased safety with cane training. Patient is returning to MD for follow up and to discuss further neurological options. Patient will DC from PT at this time as he has plateau'd with progress, to return should there be a status change. PT reviewed HEP with patient, which he verbalized and demonstrated understanding of for maintainence.     Rehab Potential  Fair    Clinical Impairments Affecting Rehab Potential  (+) social support, motivation (-) age, chronicity of pain, other comorbidities, multiple failed attempts at pain relief (injection, sx, past PT)    PT Frequency  2x / week    PT Duration  8 weeks    PT Treatment/Interventions  ADLs/Self Care Home Management;Cryotherapy;Traction;Gait training;Therapeutic exercise;Patient/family education;Manual techniques;Passive range of motion;Dry needling;Taping;Neuromuscular re-education;Balance training;Stair training;Electrical Stimulation;Ultrasound;Aquatic Therapy;Moist Heat;Iontophoresis 4mg /ml Dexamethasone;Therapeutic activities    PT Next Visit Plan  hip/core strengthening, aerobic progression    PT Home Exercise Plan  lower lumbar rotations, seated hamstring stretch, supine glute stretch    Consulted and Agree with Plan of Care  Patient       Patient will benefit from skilled therapeutic intervention in order to improve the following deficits and impairments:  Abnormal gait, Decreased balance, Decreased endurance, Difficulty walking, Decreased range of motion, Improper body mechanics, Impaired tone, Decreased activity tolerance, Decreased strength, Increased fascial restricitons, Impaired flexibility, Postural dysfunction, Pain  Visit Diagnosis: Difficulty in walking, not elsewhere classified     Problem List Patient Active Problem List   Diagnosis Date Noted  . Malignant neoplasm of right lung ([place]) 04/18/2016  . CAD (coronary artery disease) 04/01/2016  .  Adenocarcinoma ([place]) 04/01/2016  . Skin cyst 12/26/2015  . GERD (gastroesophageal reflux disease) 06/16/2015  . Abscess of back 06/06/2015  . Vertigo 03/27/2015  . Carpal tunnel syndrome 10/28/2014  . Status post cholecystectomy 09/29/2014  . Disease of digestive tract 09/29/2014  . Cardiomyopathy ([place]) 09/26/2014  . Type 2 diabetes mellitus without complications ([place])   . Sleep apnea   . Spinal stenosis, lumbar region, with neurogenic claudication 06/07/2014  . Lumbar stenosis with neurogenic claudication 06/07/2014  . Abnormal gait 08/20/2012  . H/O total knee replacement 08/20/2012  . Arthritis of knee, degenerative 08/20/2012  . Pacemaker-St.Jude 08/03/2012  . Cardiac conduction disorder 06/19/2012  . Acid reflux 06/18/2012  . 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 Triggering of digit 03/25/2012  . Hyperlipidemia 12/23/2011  . Essential hypertension 03/23/2011  . Atrioventricular block, complete ([place]) 03/23/2011  . Complete atrioventricular block ([place]) 03/23/2011   Shelton Silvas PT, DPT Shelton Silvas 10/26/2018, 3:02 PM  [facility] [address], [place], [address] Phone: [phone]   Fax:  [phone]  Name: Howard [name] MRN: [id number] Date of Birth: 1931-06-23

## 2018-10-28 ENCOUNTER — Other Ambulatory Visit: Payer: Self-pay

## 2018-10-28 ENCOUNTER — Encounter: Payer: Self-pay | Admitting: Anesthesiology

## 2018-10-28 ENCOUNTER — Ambulatory Visit: Payer: Medicare Other | Attending: Anesthesiology | Admitting: Anesthesiology

## 2018-10-28 VITALS — BP 130/65 | HR 66 | Temp 97.8°F | Resp 18 | Ht 69.5 in | Wt 195.0 lb

## 2018-10-28 DIAGNOSIS — M5432 Sciatica, left side: Secondary | ICD-10-CM | POA: Diagnosis not present

## 2018-10-28 DIAGNOSIS — M48062 Spinal stenosis, lumbar region with neurogenic claudication: Secondary | ICD-10-CM | POA: Diagnosis not present

## 2018-10-28 DIAGNOSIS — M545 Low back pain, unspecified: Secondary | ICD-10-CM

## 2018-10-28 DIAGNOSIS — I25118 Atherosclerotic heart disease of native coronary artery with other forms of angina pectoris: Secondary | ICD-10-CM | POA: Diagnosis not present

## 2018-10-28 DIAGNOSIS — Z7984 Long term (current) use of oral hypoglycemic drugs: Secondary | ICD-10-CM | POA: Insufficient documentation

## 2018-10-28 DIAGNOSIS — M961 Postlaminectomy syndrome, not elsewhere classified: Secondary | ICD-10-CM

## 2018-10-28 DIAGNOSIS — G8929 Other chronic pain: Secondary | ICD-10-CM

## 2018-10-28 DIAGNOSIS — Z79899 Other long term (current) drug therapy: Secondary | ICD-10-CM | POA: Insufficient documentation

## 2018-10-28 DIAGNOSIS — M5136 Other intervertebral disc degeneration, lumbar region: Secondary | ICD-10-CM | POA: Diagnosis not present

## 2018-10-28 DIAGNOSIS — Z7982 Long term (current) use of aspirin: Secondary | ICD-10-CM | POA: Insufficient documentation

## 2018-10-28 DIAGNOSIS — M51369 Other intervertebral disc degeneration, lumbar region without mention of lumbar back pain or lower extremity pain: Secondary | ICD-10-CM

## 2018-10-28 NOTE — Progress Notes (Signed)
Safety precautions to be maintained throughout the outpatient stay will include: orient to surroundings, keep bed in low position, maintain call bell within reach at all times, provide assistance with transfer out of bed and ambulation.  

## 2018-10-28 NOTE — Patient Instructions (Addendum)
_______Referal to Physical therapy  _____________________________________________________________________________________  ____________________________________________________________________________________________  Preparing for your procedure (without sedation)  Instructions: . Oral Intake: Do not eat or drink anything for at least 3 hours prior to your procedure. . Transportation: Unless otherwise stated by your physician, you may drive yourself after the procedure. . Blood Pressure Medicine: Take your blood pressure medicine with a sip of water the morning of the procedure. . Blood thinners: Notify our staff if you are taking any blood thinners. Depending on which one you take, there will be specific instructions on how and when to stop it. . Diabetics on insulin: Notify the staff so that you can be scheduled 1st case in the morning. If your diabetes requires high dose insulin, take only  of your normal insulin dose the morning of the procedure and notify the staff that you have done so. . Preventing infections: Shower with an antibacterial soap the morning of your procedure.  . Build-up your immune system: Take 1000 mg of Vitamin C with every meal (3 times a day) the day prior to your procedure. Marland Kitchen Antibiotics: Inform the staff if you have a condition or reason that requires you to take antibiotics before dental procedures. . Pregnancy: If you are pregnant, call and cancel the procedure. . Sickness: If you have a cold, fever, or any active infections, call and cancel the procedure. . Arrival: You must be in the facility at least 30 minutes prior to your scheduled procedure. . Children: Do not bring any children with you. . Dress appropriately: Bring dark clothing that you would not mind if they get stained. . Valuables: Do not bring any jewelry or valuables.  Procedure appointments are reserved for interventional treatments only. Marland Kitchen No Prescription Refills. . No medication changes will  be discussed during procedure appointments. . No disability issues will be discussed.  Reasons to call and reschedule or cancel your procedure: (Following these recommendations will minimize the risk of a serious complication.) . Surgeries: Avoid having procedures within 2 weeks of any surgery. (Avoid for 2 weeks before or after any surgery). . Flu Shots: Avoid having procedures within 2 weeks of a flu shots or . (Avoid for 2 weeks before or after immunizations). . Barium: Avoid having a procedure within 7-10 days after having had a radiological study involving the use of radiological contrast. (Myelograms, Barium swallow or enema study). . Heart attacks: Avoid any elective procedures or surgeries for the initial 6 months after a "Myocardial Infarction" (Heart Attack). . Blood thinners: It is imperative that you stop these medications before procedures. Let us know if you if you take any blood thinner.  . Infection: Avoid procedures during or within two weeks of an infection (including chest colds or gastrointestinal problems). Symptoms associated with infections include: Localized redness, fever, chills, night sweats or profuse sweating, burning sensation when voiding, cough, congestion, stuffiness, runny nose, sore throat, diarrhea, nausea, vomiting, cold or Flu symptoms, recent or current infections. It is specially important if the infection is over the area that we intend to treat. Marland Kitchen Heart and lung problems: Symptoms that may suggest an active cardiopulmonary problem include: cough, chest pain, breathing difficulties or shortness of breath, dizziness, ankle swelling, uncontrolled high or unusually low blood pressure, and/or palpitations. If you are experiencing any of these symptoms, cancel your procedure and contact your primary care physician for an evaluation.  Remember:  Regular Business hours are:  Monday to Thursday 8:00 AM to 4:00 PM  Provider's Schedule: Howard Pointer, MD:  Procedure days: Tuesday and Thursday 7:30 AM to 4:00 PM  Howard Santa, MD:  Procedure days: Monday and Wednesday 7:30 AM to 4:00 PM ____________________________________________________________________________________________   ____________________________________________________________________________________________    Epidural Steroid Injection Patient Information  Description: The epidural space surrounds the nerves as they exit the spinal cord.  In some patients, the nerves can be compressed and inflamed by a bulging disc or a tight spinal canal (spinal stenosis).  By injecting steroids into the epidural space, we can bring irritated nerves into direct contact with a potentially helpful medication.  These steroids act directly on the irritated nerves and can reduce swelling and inflammation which often leads to decreased pain.  Epidural steroids may be injected anywhere along the spine and from the neck to the low back depending upon the location of your pain.   After numbing the skin with local anesthetic (like Novocaine), a small needle is passed into the epidural space slowly.  You may experience a sensation of pressure while this is being done.  The entire block usually last less than 10 minutes.  Conditions which may be treated by epidural steroids:   Low back and leg pain  Neck and arm pain  Spinal stenosis  Post-laminectomy syndrome  Herpes zoster (shingles) pain  Pain from compression fractures  Preparation for the injection:  1. Do not eat any solid food or dairy products within 8 hours of your appointment.  2. You may drink clear liquids up to 3 hours before appointment.  Clear liquids include water, black coffee, juice or soda.  No milk or cream please. 3. You may take your regular medication, including pain medications, with a sip of water before your appointment  Diabetics should hold regular insulin (if taken separately) and take 1/2 normal NPH dos the morning  of the procedure.  Carry some sugar containing items with you to your appointment. 4. A driver must accompany you and be prepared to drive you home after your procedure.  5. Bring all your current medications with your. 6. An IV may be inserted and sedation may be given at the discretion of the physician.   7. A blood pressure cuff, EKG and other monitors will often be applied during the procedure.  Some patients may need to have extra oxygen administered for a short period. 8. You will be asked to provide medical information, including your allergies, prior to the procedure.  We must know immediately if you are taking blood thinners (like Coumadin/Warfarin)  Or if you are allergic to IV iodine contrast (dye). We must know if you could possible be pregnant.  Possible side-effects:  Bleeding from needle site  Infection (rare, may require surgery)  Nerve injury (rare)  Numbness & tingling (temporary)  Difficulty urinating (rare, temporary)  Spinal headache ( a headache worse with upright posture)  Light -headedness (temporary)  Pain at injection site (several days)  Decreased blood pressure (temporary)  Weakness in arm/leg (temporary)  Pressure sensation in back/neck (temporary)  Call if you experience:  Fever/chills associated with headache or increased back/neck pain.  Headache worsened by an upright position.  New onset weakness or numbness of an extremity below the injection site  Hives or difficulty breathing (go to the emergency room)  Inflammation or drainage at the infection site  Severe back/neck pain  Any new symptoms which are concerning to you  Please note:  Although the local anesthetic injected can often make your back or neck feel good for several hours after the injection, the  pain will likely return.  It takes 3-7 days for steroids to work in the epidural space.  You may not notice any pain relief for at least that one week.  If effective, we will  often do a series of three injections spaced 3-6 weeks apart to maximally decrease your pain.  After the initial series, we generally will wait several months before considering a repeat injection of the same type.  If you have any questions, please call [phone] [facility]

## 2018-10-29 NOTE — Progress Notes (Signed)
Subjective:  Patient ID: Howard [name], male    DOB: February 16, 1931  Age: 84 y.o. MRN: [id number]  CC: Follow-up   Procedure: None  HPI Howard [name] presents for reevaluation.  He was last seen in July at which point he had an L3-L4 epidural steroid injection.  He got a few days of relief but then went on an extended car ride and had recurrence of the same lower back pain and left anterior thigh pain as previously reported.  He had more success with his previous epidural back in May at the L2-L3 site.  Otherwise she is in his usual state of health with no new changes in lower extremity strength or function.  He feels that he is got slightly better strength following some of the physical therapy exercises he is currently doing.  He would like to continue these as they do seem to be helping with his strength in the lower extremities and his low back pain.  Otherwise he is in his usual state of health at this point.  No new changes in sensory or bowel or bladder function reported.  Outpatient Medications Prior to Visit  Medication Sig Dispense Refill  . albuterol (PROVENTIL HFA;VENTOLIN HFA) 108 (90 Base) MCG/ACT inhaler Inhale 1-2 puffs into the lungs every 6 (six) hours as needed for wheezing or shortness of breath. 1 Inhaler 0  . aspirin 81 MG tablet Take 81 mg by mouth daily.      . Cholecalciferol (VITAMIN D3) 1000 UNITS CAPS Take 1,000 Units by mouth daily.     . metFORMIN (GLUCOPHAGE) 500 MG tablet TAKE 1 TABLET TWICE A DAY  WITH MEALS 180 tablet 3  . metoprolol succinate (TOPROL-XL) 25 MG 24 hr tablet Take 1 tablet (25 mg) by mouth once daily at bedtime    . Multiple Vitamin (MULTIVITAMIN) capsule Take 1 capsule by mouth daily.      Marland Kitchen omeprazole (PRILOSEC) 40 MG capsule Take 1 capsule (40 mg total) by mouth daily. 90 capsule 3  . psyllium (METAMUCIL) 58.6 % powder Take 1 packet by mouth 3 (three) times daily.    . sacubitril-valsartan (ENTRESTO) 97-103 MG Take 1 tablet by mouth 2 (two)  times daily. 180 tablet 3  . simvastatin (ZOCOR) 40 MG tablet TAKE 1 TABLET AT BEDTIME 90 tablet 3  . spironolactone (ALDACTONE) 50 MG tablet Take 1/2 tablet (25 mg) by mouth once daily in the morning     No facility-administered medications prior to visit.     Review of Systems CNS: No confusion or sedation Cardiac: No angina or palpitations GI: No abdominal pain or constipation Constitutional: No nausea vomiting fevers or chills  Objective:  BP 130/65   Pulse 66   Temp 97.8 F (36.6 C)   Resp 18   Ht 5' 9.5" (1.765 m)   Wt 195 lb (88.5 kg)   SpO2 97%   BMI 28.38 kg/m    BP Readings from Last 3 Encounters:  10/28/18 130/65  10/20/18 138/66  08/25/18 134/60     Wt Readings from Last 3 Encounters:  10/28/18 195 lb (88.5 kg)  10/20/18 196 lb 12 oz (89.2 kg)  08/25/18 198 lb (89.8 kg)     Physical Exam Pt is alert and oriented PERRL EOMI HEART IS RRR no murmur or rub LCTA no wheezing or rales MUSCULOSKELETAL reveals some paraspinous muscle tenderness but no overt trigger points.  His muscle tone and bulk is at baseline and he walks with an antalgic gait.  Labs  Lab Results  Component Value Date   HGBA1C 7.6 (A) 08/25/2018   HGBA1C 7.6 (H) 05/05/2018   HGBA1C 7.3 01/02/2018   Lab Results  Component Value Date   MICROALBUR 50 09/30/2017   LDLCALC 62 08/25/2018   CREATININE 0.82 10/20/2018    -------------------------------------------------------------------------------------------------------------------- Lab Results  Component Value Date   WBC 5.8 08/25/2018   HGB 14.5 08/25/2018   HCT 45.6 08/25/2018   PLT 217 08/25/2018   GLUCOSE 122 (H) 10/20/2018   CHOL 138 08/25/2018   TRIG 152 (H) 08/25/2018   HDL 46 08/25/2018   LDLCALC 62 08/25/2018   ALT 19 08/25/2018   AST 21 08/25/2018   NA 128 (L) 10/20/2018   K 4.7 10/20/2018   CL 90 (L) 10/20/2018   CREATININE 0.82 10/20/2018   BUN 15 10/20/2018   CO2 20 10/20/2018   TSH 1.650 05/05/2018    INR 0.94 10/26/2015   HGBA1C 7.6 (A) 08/25/2018   MICROALBUR 50 09/30/2017    --------------------------------------------------------------------------------------------------------------------- Nm Myocar Multi W/spect W/wall Motion / Ef  Result Date: 08/20/2018 Pharmacological myocardial perfusion imaging study with no significant ischemia Attenuation corrected images with moderate sized region of fixed defect mid to apical inferior wall and apical region Non-attenuation corrected images with large fixed defect inferior wall Hypokinesis inferior wall and apical region,  EF estimated at 33% No EKG changes concerning for ischemia at peak stress or in recovery. Resting EKG with ventricularly paced rhythm Moderate risk scan given low ejection fraction size of fixed defect Signed, Howard Plants, MD, Ph.D [facility]     Assessment & Plan:   Howard [name] was seen today for follow-up.  Diagnoses and all orders for this visit:  Lumbar post-laminectomy syndrome -     Ambulatory referral to Physical Therapy -     Lumbar Epidural Injection; Future  DDD (degenerative disc disease), lumbar -     Lumbar Epidural Injection; Future  Sciatica of left side  Chronic left-sided low back pain without sciatica  Spinal stenosis, lumbar region, with neurogenic claudication -     Lumbar Epidural Injection; Future        ----------------------------------------------------------------------------------------------------------------------  Problem List Items Addressed This Visit      Unprioritized   Spinal stenosis, lumbar region, with neurogenic claudication   Relevant Orders   Lumbar Epidural Injection    Other Visit Diagnoses    Lumbar post-laminectomy syndrome    -  Primary   Relevant Orders   Ambulatory referral to Physical Therapy   Lumbar Epidural Injection   DDD (degenerative disc disease), lumbar       Relevant Orders   Lumbar Epidural Injection   Sciatica of left side       Chronic  left-sided low back pain without sciatica            ----------------------------------------------------------------------------------------------------------------------  1. Lumbar post-laminectomy syndrome We talked about a few options regarding continuation of conservative management with ongoing physical therapy.  This will be renewed today.  I am scheduling him for return to clinic as soon as possible for an L2-L3 epidural steroid injection.  We have also talked about possible caudal epidural injection as another opportunity.  Hopefully 1 of these will give him some significant improvement in his overall pain control.  Unfortunately he has remained quite miserable at times.  En route continue with core stretching strengthening exercises as well. - Ambulatory referral to Physical Therapy - Lumbar Epidural Injection; Future  2. DDD (degenerative disc disease), lumbar As above - Lumbar  Epidural Injection; Future  3. Sciatica of left side As above  4. Chronic left-sided low back pain without sciatica   5. Spinal stenosis, lumbar region, with neurogenic claudication  - Lumbar Epidural Injection; Future    ----------------------------------------------------------------------------------------------------------------------  I am having Howard [name] maintain his multivitamin, Vitamin D3, aspirin, psyllium, metFORMIN, sacubitril-valsartan, albuterol, simvastatin, omeprazole, spironolactone, and metoprolol succinate.   No orders of the defined types were placed in this encounter.  Patient's Medications  New Prescriptions   No medications on file  Previous Medications   ALBUTEROL (PROVENTIL HFA;VENTOLIN HFA) 108 (90 BASE) MCG/ACT INHALER    Inhale 1-2 puffs into the lungs every 6 (six) hours as needed for wheezing or shortness of breath.   ASPIRIN 81 MG TABLET    Take 81 mg by mouth daily.     CHOLECALCIFEROL (VITAMIN D3) 1000 UNITS CAPS    Take 1,000 Units by mouth daily.     METFORMIN (GLUCOPHAGE) 500 MG TABLET    TAKE 1 TABLET TWICE A DAY  WITH MEALS   METOPROLOL SUCCINATE (TOPROL-XL) 25 MG 24 HR TABLET    Take 1 tablet (25 mg) by mouth once daily at bedtime   MULTIPLE VITAMIN (MULTIVITAMIN) CAPSULE    Take 1 capsule by mouth daily.     OMEPRAZOLE (PRILOSEC) 40 MG CAPSULE    Take 1 capsule (40 mg total) by mouth daily.   PSYLLIUM (METAMUCIL) 58.6 % POWDER    Take 1 packet by mouth 3 (three) times daily.   SACUBITRIL-VALSARTAN (ENTRESTO) 97-103 MG    Take 1 tablet by mouth 2 (two) times daily.   SIMVASTATIN (ZOCOR) 40 MG TABLET    TAKE 1 TABLET AT BEDTIME   SPIRONOLACTONE (ALDACTONE) 50 MG TABLET    Take 1/2 tablet (25 mg) by mouth once daily in the morning  Modified Medications   No medications on file  Discontinued Medications   No medications on file   ----------------------------------------------------------------------------------------------------------------------  Follow-up: No follow-ups on file.    Molli Barrows, MD

## 2018-11-03 ENCOUNTER — Ambulatory Visit (HOSPITAL_BASED_OUTPATIENT_CLINIC_OR_DEPARTMENT_OTHER): Payer: Medicare Other | Admitting: Anesthesiology

## 2018-11-03 ENCOUNTER — Other Ambulatory Visit: Payer: Self-pay

## 2018-11-03 ENCOUNTER — Other Ambulatory Visit: Payer: Self-pay | Admitting: Anesthesiology

## 2018-11-03 ENCOUNTER — Ambulatory Visit
Admission: RE | Admit: 2018-11-03 | Discharge: 2018-11-03 | Disposition: A | Discharge status: Home / Self Care | Payer: Medicare Other | Source: Ambulatory Visit | Attending: Anesthesiology | Admitting: Anesthesiology

## 2018-11-03 ENCOUNTER — Encounter: Payer: Self-pay | Admitting: Anesthesiology

## 2018-11-03 VITALS — BP 158/86 | HR 71 | Temp 98.1°F | Resp 19 | Ht 69.5 in | Wt 195.0 lb

## 2018-11-03 DIAGNOSIS — M79652 Pain in left thigh: Secondary | ICD-10-CM | POA: Diagnosis not present

## 2018-11-03 DIAGNOSIS — M545 Low back pain, unspecified: Secondary | ICD-10-CM

## 2018-11-03 DIAGNOSIS — M961 Postlaminectomy syndrome, not elsewhere classified: Secondary | ICD-10-CM

## 2018-11-03 DIAGNOSIS — Z79899 Other long term (current) drug therapy: Secondary | ICD-10-CM | POA: Insufficient documentation

## 2018-11-03 DIAGNOSIS — M5432 Sciatica, left side: Secondary | ICD-10-CM

## 2018-11-03 DIAGNOSIS — M48062 Spinal stenosis, lumbar region with neurogenic claudication: Secondary | ICD-10-CM | POA: Diagnosis not present

## 2018-11-03 DIAGNOSIS — R52 Pain, unspecified: Secondary | ICD-10-CM

## 2018-11-03 DIAGNOSIS — Z7984 Long term (current) use of oral hypoglycemic drugs: Secondary | ICD-10-CM | POA: Insufficient documentation

## 2018-11-03 DIAGNOSIS — Z7982 Long term (current) use of aspirin: Secondary | ICD-10-CM | POA: Diagnosis not present

## 2018-11-03 DIAGNOSIS — M5136 Other intervertebral disc degeneration, lumbar region: Secondary | ICD-10-CM | POA: Diagnosis not present

## 2018-11-03 DIAGNOSIS — G8929 Other chronic pain: Secondary | ICD-10-CM

## 2018-11-03 MED ORDER — LIDOCAINE HCL (PF) 1 % IJ SOLN
10.0000 mL | Freq: Once | INTRAMUSCULAR | Status: AC
  Administered 2018-11-03: 5 mL via SUBCUTANEOUS
  Filled 2018-11-03: qty 10

## 2018-11-03 MED ORDER — IOPAMIDOL (ISOVUE-M 200) INJECTION 41%
INTRAMUSCULAR | Status: AC
  Filled 2018-11-03: qty 10

## 2018-11-03 MED ORDER — TRIAMCINOLONE ACETONIDE 40 MG/ML IJ SUSP
40.0000 mg | Freq: Once | INTRAMUSCULAR | Status: AC
  Administered 2018-11-03: 40 mg
  Filled 2018-11-03: qty 1

## 2018-11-03 MED ORDER — ROPIVACAINE HCL 2 MG/ML IJ SOLN
10.0000 mL | Freq: Once | INTRAMUSCULAR | Status: AC
  Administered 2018-11-03: 1 mL via EPIDURAL
  Filled 2018-11-03: qty 10

## 2018-11-03 MED ORDER — IOPAMIDOL (ISOVUE-M 200) INJECTION 41%
20.0000 mL | Freq: Once | INTRAMUSCULAR | Status: DC | PRN
  Administered 2018-11-03: 10 mL
  Filled 2018-11-03: qty 20

## 2018-11-03 MED ORDER — SODIUM CHLORIDE 0.9% FLUSH
10.0000 mL | Freq: Once | INTRAVENOUS | Status: AC
  Administered 2018-11-03: 1 mL

## 2018-11-03 MED ORDER — SODIUM CHLORIDE (PF) 0.9 % IJ SOLN
INTRAMUSCULAR | Status: AC
  Filled 2018-11-03: qty 10

## 2018-11-03 NOTE — Progress Notes (Signed)
Safety precautions to be maintained throughout the outpatient stay will include: orient to surroundings, keep bed in low position, maintain call bell within reach at all times, provide assistance with transfer out of bed and ambulation.  

## 2018-11-03 NOTE — Telephone Encounter (Signed)
Patient BP (HR) recordings obtained:  11/4- 11:30 am- 158/84 (74)           2:30 pm- 144/71 (75)           6:00 pm- 153/69 (82)  11/5- 7:30 am- 164/79 (80)          5:30 pm 161/66 (79) 11/6- 7:30 am- 151/74 (72) 11/7- 7:10 am (right)- 155/86 (83)          7:11 am (left)- 130/80 (98)           5:00 pm- 144/72 (93) 11/8- 7:25 am- 156/80 (73) 11/9- 8:00 am- 150/84 (80)          12:30 pm- 176/76 (73) 11/10- 6:30 am- 158/51 (78) 11/11- 6:45 am- 162/88 (79) 11/12- 6:00 am 146/82 (92) New meter: 11/12- 6:10 am- 154/78 (87)            6:11 am- 158/79 (85)   To Dr. [name] to review.

## 2018-11-03 NOTE — Progress Notes (Signed)
Subjective:  Patient ID: Howard [name], male    DOB: 05-Aug-1931  Age: 84 y.o. MRN: [id number]  CC: Procedure   Procedure: L2-3 epidural steroid under fluoroscopic guidance without sedation  HPI Howard [name] presents for reevaluation.  He was last seen a few weeks ago and presents today for his epidural injection.  He continues to complain primarily of left anterior thigh pain and persistent low back pain and occasional cramping of the calves.  Otherwise the quality characteristic distribution of his pain is been stable in nature  Outpatient Medications Prior to Visit  Medication Sig Dispense Refill  . albuterol (PROVENTIL HFA;VENTOLIN HFA) 108 (90 Base) MCG/ACT inhaler Inhale 1-2 puffs into the lungs every 6 (six) hours as needed for wheezing or shortness of breath. 1 Inhaler 0  . aspirin 81 MG tablet Take 81 mg by mouth daily.      . Cholecalciferol (VITAMIN D3) 1000 UNITS CAPS Take 1,000 Units by mouth daily.     . metFORMIN (GLUCOPHAGE) 500 MG tablet TAKE 1 TABLET TWICE A DAY  WITH MEALS 180 tablet 3  . metoprolol succinate (TOPROL-XL) 25 MG 24 hr tablet Take 1 tablet (25 mg) by mouth once daily at bedtime    . Multiple Vitamin (MULTIVITAMIN) capsule Take 1 capsule by mouth daily.      Marland Kitchen omeprazole (PRILOSEC) 40 MG capsule Take 1 capsule (40 mg total) by mouth daily. 90 capsule 3  . psyllium (METAMUCIL) 58.6 % powder Take 1 packet by mouth 3 (three) times daily.    . sacubitril-valsartan (ENTRESTO) 97-103 MG Take 1 tablet by mouth 2 (two) times daily. 180 tablet 3  . simvastatin (ZOCOR) 40 MG tablet TAKE 1 TABLET AT BEDTIME 90 tablet 3  . spironolactone (ALDACTONE) 50 MG tablet Take 1/2 tablet (25 mg) by mouth once daily in the morning     No facility-administered medications prior to visit.     Review of Systems CNS: No confusion or sedation Cardiac: No angina or palpitations GI: No abdominal pain or constipation Constitutional: No nausea vomiting fevers or  chills  Objective:  BP (!) 158/86   Pulse 71   Temp 98.1 F (36.7 C)   Resp 19   Ht 5' 9.5" (1.765 m)   Wt 195 lb (88.5 kg)   SpO2 96%   BMI 28.38 kg/m    BP Readings from Last 3 Encounters:  11/03/18 (!) 158/86  10/28/18 130/65  10/20/18 138/66     Wt Readings from Last 3 Encounters:  11/03/18 195 lb (88.5 kg)  10/28/18 195 lb (88.5 kg)  10/20/18 196 lb 12 oz (89.2 kg)     Physical Exam Pt is alert and oriented PERRL EOMI HEART IS RRR no murmur or rub LCTA no wheezing or rales MUSCULOSKELETAL reveals no change in muscle tone or bulk with a positive straight leg raise on the left.  He has a mildly antalgic gait.  Labs  Lab Results  Component Value Date   HGBA1C 7.6 (A) 08/25/2018   HGBA1C 7.6 (H) 05/05/2018   HGBA1C 7.3 01/02/2018   Lab Results  Component Value Date   MICROALBUR 50 09/30/2017   LDLCALC 62 08/25/2018   CREATININE 0.82 10/20/2018    -------------------------------------------------------------------------------------------------------------------- Lab Results  Component Value Date   WBC 5.8 08/25/2018   HGB 14.5 08/25/2018   HCT 45.6 08/25/2018   PLT 217 08/25/2018   GLUCOSE 122 (H) 10/20/2018   CHOL 138 08/25/2018   TRIG 152 (H) 08/25/2018   HDL  46 08/25/2018   LDLCALC 62 08/25/2018   ALT 19 08/25/2018   AST 21 08/25/2018   NA 128 (L) 10/20/2018   K 4.7 10/20/2018   CL 90 (L) 10/20/2018   CREATININE 0.82 10/20/2018   BUN 15 10/20/2018   CO2 20 10/20/2018   TSH 1.650 05/05/2018   INR 0.94 10/26/2015   HGBA1C 7.6 (A) 08/25/2018   MICROALBUR 50 09/30/2017    --------------------------------------------------------------------------------------------------------------------- Dg C-arm 1-60 Min-no Report  Result Date: 11/03/2018 Fluoroscopy was utilized by the requesting physician.  No radiographic interpretation.     Assessment & Plan:   Howard [name] was seen today for procedure.  Diagnoses and all orders for this  visit:  Lumbar post-laminectomy syndrome -     Lumbar Epidural Injection  DDD (degenerative disc disease), lumbar -     Lumbar Epidural Injection  Sciatica of left side  Chronic left-sided low back pain without sciatica  Spinal stenosis, lumbar region, with neurogenic claudication -     Lumbar Epidural Injection  Other orders -     triamcinolone acetonide (KENALOG-40) injection 40 mg -     sodium chloride flush (NS) 0.9 % injection 10 mL -     ropivacaine (PF) 2 mg/mL (0.2%) (NAROPIN) injection 10 mL -     lidocaine (PF) (XYLOCAINE) 1 % injection 10 mL -     iopamidol (ISOVUE-M) 41 % intrathecal injection 20 mL        ----------------------------------------------------------------------------------------------------------------------  Problem List Items Addressed This Visit      Unprioritized   Spinal stenosis, lumbar region, with neurogenic claudication   Relevant Medications   triamcinolone acetonide (KENALOG-40) injection 40 mg (Completed)    Other Visit Diagnoses    Lumbar post-laminectomy syndrome    -  Primary   DDD (degenerative disc disease), lumbar       Relevant Medications   triamcinolone acetonide (KENALOG-40) injection 40 mg (Completed)   Sciatica of left side       Chronic left-sided low back pain without sciatica       Relevant Medications   triamcinolone acetonide (KENALOG-40) injection 40 mg (Completed)        ----------------------------------------------------------------------------------------------------------------------  1. Lumbar post-laminectomy syndrome We will proceed with his epidural injection today at L2-3.  We have gone over the risks and benefits of the procedure in full detail and all questions answered with a scheduled return to clinic in 2 months for possible repeat epidural at L2-3 versus caudal injection. - Lumbar Epidural Injection  2. DDD (degenerative disc disease), lumbar As above - Lumbar Epidural Injection  3.  Sciatica of left side As above  4. Chronic left-sided low back pain without sciatica As above and continue efforts at physical therapy stretching strengthening and is reviewed again today.  5. Spinal stenosis, lumbar region, with neurogenic claudication As above - Lumbar Epidural Injection    ----------------------------------------------------------------------------------------------------------------------  I am having Howard [name] maintain his multivitamin, Vitamin D3, aspirin, psyllium, metFORMIN, sacubitril-valsartan, albuterol, simvastatin, omeprazole, spironolactone, and metoprolol succinate. We administered triamcinolone acetonide, sodium chloride flush, ropivacaine (PF) 2 mg/mL (0.2%), lidocaine (PF), and iopamidol.   Meds ordered this encounter  Medications  . triamcinolone acetonide (KENALOG-40) injection 40 mg  . sodium chloride flush (NS) 0.9 % injection 10 mL  . ropivacaine (PF) 2 mg/mL (0.2%) (NAROPIN) injection 10 mL  . lidocaine (PF) (XYLOCAINE) 1 % injection 10 mL  . iopamidol (ISOVUE-M) 41 % intrathecal injection 20 mL   Patient's Medications  New Prescriptions   No medications on  file  Previous Medications   ALBUTEROL (PROVENTIL HFA;VENTOLIN HFA) 108 (90 BASE) MCG/ACT INHALER    Inhale 1-2 puffs into the lungs every 6 (six) hours as needed for wheezing or shortness of breath.   ASPIRIN 81 MG TABLET    Take 81 mg by mouth daily.     CHOLECALCIFEROL (VITAMIN D3) 1000 UNITS CAPS    Take 1,000 Units by mouth daily.    METFORMIN (GLUCOPHAGE) 500 MG TABLET    TAKE 1 TABLET TWICE A DAY  WITH MEALS   METOPROLOL SUCCINATE (TOPROL-XL) 25 MG 24 HR TABLET    Take 1 tablet (25 mg) by mouth once daily at bedtime   MULTIPLE VITAMIN (MULTIVITAMIN) CAPSULE    Take 1 capsule by mouth daily.     OMEPRAZOLE (PRILOSEC) 40 MG CAPSULE    Take 1 capsule (40 mg total) by mouth daily.   PSYLLIUM (METAMUCIL) 58.6 % POWDER    Take 1 packet by mouth 3 (three) times daily.    SACUBITRIL-VALSARTAN (ENTRESTO) 97-103 MG    Take 1 tablet by mouth 2 (two) times daily.   SIMVASTATIN (ZOCOR) 40 MG TABLET    TAKE 1 TABLET AT BEDTIME   SPIRONOLACTONE (ALDACTONE) 50 MG TABLET    Take 1/2 tablet (25 mg) by mouth once daily in the morning  Modified Medications   No medications on file  Discontinued Medications   No medications on file   ----------------------------------------------------------------------------------------------------------------------  Follow-up: Return for procedure, evaluation.   Procedure: 2 3 LESI with fluoroscopic guidance and no moderate sedation  NOTE: The risks, benefits, and expectations of the procedure have been discussed and explained to the patient who was understanding and in agreement with suggested treatment plan. No guarantees were made.  DESCRIPTION OF PROCEDURE: Lumbar epidural steroid injection with no IV Versed, EKG, blood pressure, pulse, and pulse oximetry monitoring. The procedure was performed with the patient in the prone position under fluoroscopic guidance.  Sterile prep x3 was initiated and I then injected subcutaneous lidocaine to the overlying L2-3 site after its fluoroscopic identifictation.  Using strict aseptic technique, I then advanced an 18-gauge Tuohy epidural needle in the midline using interlaminar approach via loss-of-resistance to saline technique. There was negative aspiration for heme or  CSF.  I then confirmed position with both AP and Lateral fluoroscan.  2 cc of Isovue were injected and a  total of 5 mL of Preservative-Free normal saline mixed with 40 mg of Kenalog and 1cc Ropicaine 0.2 percent were injected incrementally via the  epidurally placed needle. The needle was removed. The patient tolerated the injection well and was convalesced and discharged to home in stable condition. Should the patient have any post procedure difficulty they have been instructed on how to contact us for assistance.    Molli Barrows,  MD

## 2018-11-03 NOTE — Telephone Encounter (Signed)
Patient wife came by office Dropped of patient's blood pressure readings to be reviewed Placed in nurse box

## 2018-11-03 NOTE — Telephone Encounter (Signed)
Would consider adding Imdur 30 mg daily Might have slight headache for several days then hopefully this will resolve If he has persistent side effects would let us know

## 2018-11-04 ENCOUNTER — Ambulatory Visit (INDEPENDENT_AMBULATORY_CARE_PROVIDER_SITE_OTHER): Payer: Medicare Other

## 2018-11-04 VITALS — BP 148/70 | HR 92 | Temp 98.0°F | Ht 70.0 in | Wt 204.6 lb

## 2018-11-04 DIAGNOSIS — Z0000 Encounter for general adult medical examination without abnormal findings: Secondary | ICD-10-CM | POA: Diagnosis not present

## 2018-11-04 MED ORDER — ISOSORBIDE MONONITRATE ER 30 MG PO TB24: 30.0000 mg | ORAL_TABLET | Freq: Every day | ORAL | 3 refills | Status: DC

## 2018-11-04 NOTE — Patient Instructions (Addendum)
Howard [name] , Thank you for taking time to come for your Medicare Wellness Visit. I appreciate your ongoing commitment to your health goals. Please review the following plan we discussed and let me know if I can assist you in the future.   Screening recommendations/referrals: Colonoscopy: No longer required.  Recommended yearly ophthalmology/optometry visit for glaucoma screening and checkup Recommended yearly dental visit for hygiene and checkup  Vaccinations: Influenza vaccine: Up to date Pneumococcal vaccine: Completed series Tdap vaccine: Up to date, due 08/2021 Shingles vaccine: Shingrix #1 completed, #2 currently due.    Advanced directives: Currently on file.   Conditions/risks identified: Continue trying to increase water intake to 4 glasses a day.   Next appointment: 11/27/18 with [name] Chrismon.  Preventive Care 65 Years and Older, Male Preventive care refers to lifestyle choices and visits with your health care provider that can promote health and wellness. What does preventive care include?  A yearly physical exam. This is also called an annual well check.  Dental exams once or twice a year.  Routine eye exams. Ask your health care provider how often you should have your eyes checked.  Personal lifestyle choices, including:  Daily care of your teeth and gums.  Regular physical activity.  Eating a healthy diet.  Avoiding tobacco and drug use.  Limiting alcohol use.  Practicing safe sex.  Taking low doses of aspirin every day.  Taking vitamin and mineral supplements as recommended by your health care provider. What happens during an annual well check? The services and screenings done by your health care provider during your annual well check will depend on your age, overall health, lifestyle risk factors, and family history of disease. Counseling  Your health care provider may ask you questions about your:  Alcohol use.  Tobacco use.  Drug  use.  Emotional well-being.  Home and relationship well-being.  Sexual activity.  Eating habits.  History of falls.  Memory and ability to understand (cognition).  Work and work Statistician. Screening  You may have the following tests or measurements:  Height, weight, and BMI.  Blood pressure.  Lipid and cholesterol levels. These may be checked every 5 years, or more frequently if you are over 7 years old.  Skin check.  Lung cancer screening. You may have this screening every year starting at age 29 if you have a 30-pack-year history of smoking and currently smoke or have quit within the past 15 years.  Fecal occult blood test (FOBT) of the stool. You may have this test every year starting at age 96.  Flexible sigmoidoscopy or colonoscopy. You may have a sigmoidoscopy every 5 years or a colonoscopy every 10 years starting at age 81.  Prostate cancer screening. Recommendations will vary depending on your family history and other risks.  Hepatitis C blood test.  Hepatitis B blood test.  Sexually transmitted disease (STD) testing.  Diabetes screening. This is done by checking your blood sugar (glucose) after you have not eaten for a while (fasting). You may have this done every 1-3 years.  Abdominal aortic aneurysm (AAA) screening. You may need this if you are a current or former smoker.  Osteoporosis. You may be screened starting at age 40 if you are at high risk. Talk with your health care provider about your test results, treatment options, and if necessary, the need for more tests. Vaccines  Your health care provider may recommend certain vaccines, such as:  Influenza vaccine. This is recommended every year.  Tetanus, diphtheria, and  acellular pertussis (Tdap, Td) vaccine. You may need a Td booster every 10 years.  Zoster vaccine. You may need this after age 6.  Pneumococcal 13-valent conjugate (PCV13) vaccine. One dose is recommended after age  26.  Pneumococcal polysaccharide (PPSV23) vaccine. One dose is recommended after age 52. Talk to your health care provider about which screenings and vaccines you need and how often you need them. This information is not intended to replace advice given to you by your health care provider. Make sure you discuss any questions you have with your health care provider. Document Released: 01/05/2016 Document Revised: 08/28/2016 Document Reviewed: 10/10/2015 Elsevier Interactive Patient Education  2017 [place] Prevention in the Home Falls can cause injuries. They can happen to people of all ages. There are many things you can do to make your home safe and to help prevent falls. What can I do on the outside of my home?  Regularly fix the edges of walkways and driveways and fix any cracks.  Remove anything that might make you trip as you walk through a door, such as a raised step or threshold.  Trim any bushes or trees on the path to your home.  Use bright outdoor lighting.  Clear any walking paths of anything that might make someone trip, such as rocks or tools.  Regularly check to see if handrails are loose or broken. Make sure that both sides of any steps have handrails.  Any raised decks and porches should have guardrails on the edges.  Have any leaves, snow, or ice cleared regularly.  Use sand or salt on walking paths during winter.  Clean up any spills in your garage right away. This includes oil or grease spills. What can I do in the bathroom?  Use night lights.  Install grab bars by the toilet and in the tub and shower. Do not use towel bars as grab bars.  Use non-skid mats or decals in the tub or shower.  If you need to sit down in the shower, use a plastic, non-slip stool.  Keep the floor dry. Clean up any water that spills on the floor as soon as it happens.  Remove soap buildup in the tub or shower regularly.  Attach bath mats securely with double-sided  non-slip rug tape.  Do not have throw rugs and other things on the floor that can make you trip. What can I do in the bedroom?  Use night lights.  Make sure that you have a light by your bed that is easy to reach.  Do not use any sheets or blankets that are too big for your bed. They should not hang down onto the floor.  Have a firm chair that has side arms. You can use this for support while you get dressed.  Do not have throw rugs and other things on the floor that can make you trip. What can I do in the kitchen?  Clean up any spills right away.  Avoid walking on wet floors.  Keep items that you use a lot in easy-to-reach places.  If you need to reach something above you, use a strong step stool that has a grab bar.  Keep electrical cords out of the way.  Do not use floor polish or wax that makes floors slippery. If you must use wax, use non-skid floor wax.  Do not have throw rugs and other things on the floor that can make you trip. What can I do with my stairs?  Do not leave any items on the stairs.  Make sure that there are handrails on both sides of the stairs and use them. Fix handrails that are broken or loose. Make sure that handrails are as long as the stairways.  Check any carpeting to make sure that it is firmly attached to the stairs. Fix any carpet that is loose or worn.  Avoid having throw rugs at the top or bottom of the stairs. If you do have throw rugs, attach them to the floor with carpet tape.  Make sure that you have a light switch at the top of the stairs and the bottom of the stairs. If you do not have them, ask someone to add them for you. What else can I do to help prevent falls?  Wear shoes that:  Do not have high heels.  Have rubber bottoms.  Are comfortable and fit you well.  Are closed at the toe. Do not wear sandals.  If you use a stepladder:  Make sure that it is fully opened. Do not climb a closed stepladder.  Make sure that both  sides of the stepladder are locked into place.  Ask someone to hold it for you, if possible.  Clearly mark and make sure that you can see:  Any grab bars or handrails.  First and last steps.  Where the edge of each step is.  Use tools that help you move around (mobility aids) if they are needed. These include:  Canes.  Walkers.  Scooters.  Crutches.  Turn on the lights when you go into a dark area. Replace any light bulbs as soon as they burn out.  Set up your furniture so you have a clear path. Avoid moving your furniture around.  If any of your floors are uneven, fix them.  If there are any pets around you, be aware of where they are.  Review your medicines with your doctor. Some medicines can make you feel dizzy. This can increase your chance of falling. Ask your doctor what other things that you can do to help prevent falls. This information is not intended to replace advice given to you by your health care provider. Make sure you discuss any questions you have with your health care provider. Document Released: 10/05/2009 Document Revised: 05/16/2016 Document Reviewed: 01/13/2015 Elsevier Interactive Patient Education  2017 [employer].

## 2018-11-04 NOTE — Progress Notes (Addendum)
Subjective:   Howard [name] is a 84 y.o. male who presents for Medicare Annual/Subsequent preventive examination.  Review of Systems:  N/A  Cardiac Risk Factors include: advanced age (>59men, >68 women);diabetes mellitus;dyslipidemia;hypertension;male gender     Objective:    Vitals: BP (!) 148/70 (BP Location: Right Arm)   Pulse 92   Temp 98 F (36.7 C) (Oral)   Ht 5\' 10"  (1.778 m)   Wt 204 lb 9.6 oz (92.8 kg)   BMI 29.36 kg/m   Body mass index is 29.36 kg/m.  Advanced Directives 11/04/2018 11/03/2018 10/22/2018 10/08/2018 10/01/2018 09/24/2018 09/15/2018  Does Patient Have a Medical Advance Directive? Yes Yes Yes Yes Yes Yes Yes  Type of Paramedic of [place];Living will [place];Living will [place];Living will [place];Living will [place];Living will [place];Living will [place];Living will  Does patient want to make changes to medical advance directive? - - - - - - -  Copy of [place] in Chart? Yes - validated most recent copy scanned in chart (See row information) No - copy requested Yes Yes Yes Yes Yes  Would patient like information on creating a medical advance directive? - - - - - - -    Tobacco Social History   Tobacco Use  Smoking Status Former Smoker  . Years: 4.00  Smokeless Tobacco Never Used  Tobacco Comment   Quit 2011     Counseling given: Not Answered Comment: Quit 2011   Clinical Intake:  Pre-visit preparation completed: Yes  Pain : No/denies pain Pain Score: 0-No pain    Diabetes:  Is the patient diabetic?  Yes , type 2 If diabetic, was a CBG obtained today?  No  Did the patient bring in their glucometer from home?  No  How often do you monitor your CBG's? Once a week.   Financial Strains and Diabetes Management:  Are you having any financial strains with the device,  your supplies or your medication? No .  Does the patient want to be seen by Chronic Care Management for management of their diabetes?  No  Would the patient like to be referred to a Nutritionist or for Diabetic Management?  No   Diabetic Exams:  Diabetic Eye Exam: Completed 05/21/18.   Diabetic Foot Exam: Completed 09/30/17. Pt has been advised about the importance in completing this exam. Note made to complete at next OV.     How often do you need to have someone help you when you read instructions, pamphlets, or other written materials from your doctor or pharmacy?: 1 - Never  Interpreter Needed?: No  Information entered by :: [facility], LPN  Past Medical History:  Diagnosis Date  . Arthritis   . Bell palsy   . Cancer [facility])    prostate and skin  . Chronic combined systolic and diastolic CHF, NYHA class 1 ([place])    a. 07/2014 Echo: EF 35-40%, Gr 1 DD.  Marland Kitchen Complete heart block ([place])    a. 11/2010 s/p SJM 2210 [facility] DC PPM, ser# [id number].  . Depression   . Diabetes mellitus without complication ([place])   . Fall 11-10-14  . GERD (gastroesophageal reflux disease)   . History of prostate cancer   . Hyperlipidemia   . Hypertension   . LBBB (left bundle branch block)   . Left-sided Bell's palsy   . Lung cancer ([place]) 2016  . NICM (nonischemic cardiomyopathy) (  [place])    a. 07/2014 Echo: EF 35-40%, mid-apicalanteroseptal DK, Gr 1 DD, mild-mod dil LA.  . Non-obstructive CAD    a. 07/2014 Abnl MV;  b. 08/2014 Cath: LM nl, LAD 30p, RI 40p, LCX nl, OM1 40, RCA dominant 30p, 70d-->Med Rx.  Marland Kitchen Poor balance   . Presence of permanent cardiac pacemaker   . Sleep apnea    a. cpap  . Vertigo   . WPW (Wolff-Parkinson-White syndrome)    a. S/P RFCA 1991.   Past Surgical History:  Procedure Laterality Date  . APPLICATION OF WOUND VAC Left 06/07/2015   Procedure: APPLICATION OF WOUND VAC;  Surgeon: Robert Bellow, MD;  Location: [facility] ORS;  Service: General;  Laterality: Left;  left upper back  .  BACK SURGERY     2011  . CARDIAC CATHETERIZATION  08/26/2014   Single vessel obstructive CAD  . CARPAL TUNNEL RELEASE  04-04-15   [facility]  . CATARACT EXTRACTION  07-31-11 and 09-18-11  . Catheter ablation  1991   for WPW  . cervical fusion    . CHOLECYSTECTOMY  09-07-14  . HAND SURGERY     right 1993; left 2005  . HERNIA REPAIR  1955  . INSERT / REPLACE / REMOVE PACEMAKER    . JOINT REPLACEMENT Left 2013   knee  . JOINT REPLACEMENT Right 2004   knee  . KNEE SURGERY     left knee 1991 and 1992; right knee 1995  . LEFT HEART CATHETERIZATION WITH CORONARY ANGIOGRAM N/A 08/26/2014   Procedure: LEFT HEART CATHETERIZATION WITH CORONARY ANGIOGRAM;  Surgeon: [name], MD;  Location: [facility] CATH LAB;  Service: Cardiovascular;  Laterality: N/A;  . LUMBAR LAMINECTOMY/DECOMPRESSION MICRODISCECTOMY N/A 06/07/2014   Procedure: LUMBAR FOUR TO FIVE LUMBAR LAMINECTOMY/DECOMPRESSION MICRODISCECTOMY 1 LEVEL;  Surgeon: [name], MD;  Location: [place] NEURO ORS;  Service: Neurosurgery;  Laterality: N/A;  . LUNG BIOPSY Right 2016   Dr [name]  . PACEMAKER INSERTION     PPM-- St Jude 11/30/10 by [name]  . PROSTATE SURGERY     cancer--1998, prostatectomy  . REPLACEMENT TOTAL KNEE     2004  . ruptured disc     1962 and 1998  . TRIGGER FINGER RELEASE  01-24-15  . WOUND DEBRIDEMENT Left 06/07/2015   Procedure: DEBRIDEMENT WOUND;  Surgeon: Robert Bellow, MD;  Location: [facility] ORS;  Service: General;  Laterality: Left;  left upper back   Family History  Problem Relation Age of Onset  . Heart attack Mother   . Hyperlipidemia Mother   . CAD Unknown   . Prostate cancer Neg Hx    Social History   Socioeconomic History  . Marital status: Married    Spouse name: Not on file  . Number of children: 2  . Years of education: College  . Highest education level: Some college, no degree  Occupational History  . Occupation: Retired  Scientific laboratory technician  . Financial resource strain: Not hard at all  . Food insecurity:    Worry:  Never true    Inability: Never true  . Transportation needs:    Medical: No    Non-medical: No  Tobacco Use  . Smoking status: Former Smoker    Years: 4.00  . Smokeless tobacco: Never Used  . Tobacco comment: Quit 2011  Substance and Sexual Activity  . Alcohol use: No    Frequency: Never  . Drug use: No  . Sexual activity: Not on file  Lifestyle  . Physical  activity:    Days per week: 0 days    Minutes per session: 0 min  . Stress: Not at all  Relationships  . Social connections:    Talks on phone: Patient refused    Gets together: Patient refused    Attends religious service: Patient refused    Active member of club or organization: Patient refused    Attends meetings of clubs or organizations: Patient refused    Relationship status: Patient refused  Other Topics Concern  . Not on file  Social History Narrative   Drinks 2 cups of coffee a day     Outpatient Encounter Medications as of 11/04/2018  Medication Sig  . aspirin 81 MG tablet Take 81 mg by mouth daily.    . Cholecalciferol (VITAMIN D3) 1000 UNITS CAPS Take 1,000 Units by mouth daily.   . fluticasone (FLONASE) 50 MCG/ACT nasal spray Place 2 sprays into both nostrils daily.  . metFORMIN (GLUCOPHAGE) 500 MG tablet TAKE 1 TABLET TWICE A DAY  WITH MEALS  . metoprolol succinate (TOPROL-XL) 25 MG 24 hr tablet Take 1 tablet (25 mg) by mouth once daily at bedtime  . montelukast (SINGULAIR) 10 MG tablet Take 10 mg by mouth at bedtime.  Marland Kitchen omeprazole (PRILOSEC) 40 MG capsule Take 1 capsule (40 mg total) by mouth daily. (Patient taking differently: Take 40 mg by mouth 2 (two) times daily. )  . psyllium (METAMUCIL) 58.6 % powder Take 1 packet by mouth 3 (three) times daily.  . sacubitril-valsartan (ENTRESTO) 97-103 MG Take 1 tablet by mouth 2 (two) times daily.  . simvastatin (ZOCOR) 40 MG tablet TAKE 1 TABLET AT BEDTIME  . spironolactone (ALDACTONE) 50 MG tablet Take 1/2 tablet (25 mg) by mouth once daily in the morning  .  albuterol (PROVENTIL HFA;VENTOLIN HFA) 108 (90 Base) MCG/ACT inhaler Inhale 1-2 puffs into the lungs every 6 (six) hours as needed for wheezing or shortness of breath. (Patient not taking: Reported on 11/04/2018)  . Multiple Vitamin (MULTIVITAMIN) capsule Take 1 capsule by mouth daily.     No facility-administered encounter medications on file as of 11/04/2018.     Activities of Daily Living In your present state of health, do you have any difficulty performing the following activities: 11/04/2018  Hearing? Y  Comment Wears bilarteral hearing aids.   Vision? N  Difficulty concentrating or making decisions? N  Walking or climbing stairs? Y  Comment Due to back pain.   Dressing or bathing? N  Doing errands, shopping? N  Preparing Food and eating ? N  Using the Toilet? N  In the past six months, have you accidently leaked urine? Y  Comment Wears depends daily.  Do you have problems with loss of bowel control? N  Managing your Medications? N  Managing your Finances? N  Housekeeping or managing your Housekeeping? N  Some recent data might be hidden    Patient Care Team: Chrismon, [name], PA as PCP - General (Family Medicine) [name], Kathlene November, MD as Consulting Physician (Cardiology) Dasher, [name], MD as Consulting Physician (Dermatology) Idelle Crouch, [name], Student-RN as Student Nurse (Student) Birder Robson, MD as Referring Physician (Ophthalmology) [name], MD as Consulting Physician (Vascular Surgery) Deboraha Sprang, MD as Consulting Physician (Cardiology) Erby Pian, MD as Referring Physician (Specialist) Molli Barrows, MD as Consulting Physician (Anesthesiology)   Assessment:   This is a routine wellness examination for Darryon.  Exercise Activities and Dietary recommendations Current Exercise Habits: The patient does not  participate in regular exercise at present(Not currently to prep for back injection given yesterday. ), Exercise limited  by: orthopedic condition(s)  Goals    . Increase water intake     Starting 10/24/16, I will increase my water intake to 3-4 glasses a day.    . Increase water intake     Recommend increasing water intake to 4-6 glasses a day.        Fall Risk Fall Risk  11/04/2018 11/03/2018 10/28/2018 07/14/2018 06/10/2018  Falls in the past year? 0 0 0 No No  Number falls in past yr: - 0 - - -  Injury with Fall? - 0 - - -  Risk Factor Category  - - - - -  Risk for fall due to : - - - - -  Risk for fall due to: Comment - - - - -  Follow up - - - - -   [place]:  Any stairs in or around the home WITH handrails? Yes  Home free of loose throw rugs in walkways, pet beds, electrical cords, etc? Yes  Adequate lighting in your home to reduce risk of falls? Yes   ASSISTIVE DEVICES UTILIZED TO PREVENT FALLS:  Life alert? Yes Use of a cane, walker or w/c? Yes  Grab bars in the bathroom? Yes  Shower chair or bench in shower? Yes  Elevated toilet seat or a handicapped toilet? Yes    TIMED UP AND GO:  Was the test performed? No .    Depression Screen PHQ 2/9 Scores 11/04/2018 11/03/2018 07/14/2018 04/27/2018  PHQ - 2 Score 0 0 0 0    Cognitive Function     6CIT Screen 11/04/2018 10/31/2017 10/24/2016  What Year? 0 points 0 points 0 points  What month? 0 points 0 points 0 points  What time? 0 points 0 points 0 points  Count back from 20 0 points 0 points 0 points  Months in reverse 0 points 2 points 0 points  Repeat phrase 0 points 2 points 6 points  Total Score 0 4 6    Immunization History  Administered Date(s) Administered  . Influenza, High Dose Seasonal PF 09/15/2015, 09/30/2017, 10/02/2018  . Influenza-Unspecified 08/23/2013, 09/15/2015, 09/12/2016  . Pneumococcal Conjugate-13 04/18/2016  . Pneumococcal Polysaccharide-23 09/30/2017  . Tdap 09/07/2011  . Zoster 11/22/2013  . Zoster Recombinat (Shingrix) 08/28/2018    Qualifies for Shingles  Vaccine? Yes #2. Due for Shingrix. Education has been provided regarding the importance of this vaccine. Pt has been advised to call insurance company to determine out of pocket expense. Advised may also receive vaccine at local pharmacy or Health Dept. Verbalized acceptance and understanding.  Tdap: Up to date  Flu Vaccine: Up to date  Pneumococcal Vaccine: Up to date   Screening Tests Health Maintenance  Topic Date Due  . FOOT EXAM  09/30/2018  . URINE MICROALBUMIN  09/30/2018  . HEMOGLOBIN A1C  02/23/2019  . OPHTHALMOLOGY EXAM  05/22/2019  . TETANUS/TDAP  09/06/2021  . INFLUENZA VACCINE  Completed  . PNA vac Low Risk Adult  Completed   Cancer Screenings:  Colorectal Screening: Completed 05/31/13. No longer required.   Lung Cancer Screening: (Low Dose CT Chest recommended if Age 63-80 years, 30 pack-year currently smoking OR have quit w/in 15years.) does not qualify.    Additional Screening:  Vision Screening: Recommended annual ophthalmology exams for early detection of glaucoma and other disorders of the eye.  Dental Screening: Recommended annual dental exams  for proper oral hygiene  Community Resource Referral:  CRR required this visit?  No        Plan:  I have personally reviewed and addressed the Medicare Annual Wellness questionnaire and have noted the following in the patient's chart:  A. Medical and social history B. Use of alcohol, tobacco or illicit drugs  C. Current medications and supplements D. Functional ability and status E.  Nutritional status F.  Physical activity G. Advance directives H. List of other physicians I.  Hospitalizations, surgeries, and ER visits in previous 12 months J.  [place] such as hearing and vision if needed, cognitive and depression L. Referrals and appointments - none  In addition, I have reviewed and discussed with patient certain preventive protocols, quality metrics, and best practice recommendations. A  written personalized care plan for preventive services as well as general preventive health recommendations were provided to patient.  See attached scanned questionnaire for additional information.   Signed,  Fabio Neighbors, LPN Nurse Health Advisor   Nurse Recommendations: Pt needs a diabetic foot exam and urine check at next OV.   Reviewed Nurse Health Advisor's note and recommendations. Agree with plan and documentation. Review with patient at his next appointment.

## 2018-11-04 NOTE — Telephone Encounter (Signed)
Called and spoke with patient's wife, ok per DPR. She verbalized understanding for patient to start imdur 30 mg by mouth once a day. Rx sent to pharmacy. She is aware of side effects and will have patient take in the evening and let us know if any new problems or questions arise.

## 2018-11-05 ENCOUNTER — Telehealth: Payer: Self-pay | Admitting: *Deleted

## 2018-11-05 NOTE — Telephone Encounter (Signed)
Spoke with Howard [name] wife and she states that he is doing well and they feel like he is doing better.

## 2018-11-10 ENCOUNTER — Ambulatory Visit: Payer: Medicare Other | Admitting: Physical Therapy

## 2018-11-10 ENCOUNTER — Encounter: Payer: Self-pay | Admitting: Physical Therapy

## 2018-11-10 DIAGNOSIS — M6281 Muscle weakness (generalized): Secondary | ICD-10-CM | POA: Diagnosis not present

## 2018-11-10 DIAGNOSIS — R262 Difficulty in walking, not elsewhere classified: Secondary | ICD-10-CM

## 2018-11-10 NOTE — Therapy (Signed)
[facility] [address], [place], [address] Phone: [phone]   Fax:  [phone]  Physical Therapy Treatment  Patient Details  Name: Howard [name] MRN: [id number] Date of Birth: Apr 01, 1931 Referring Provider (PT): [name]   Encounter Date: 11/10/2018    Past Medical History:  Diagnosis Date  . Arthritis   . Bell palsy   . Cancer [facility])    prostate and skin  . Chronic combined systolic and diastolic CHF, NYHA class 1 ([place])    a. 07/2014 Echo: EF 35-40%, Gr 1 DD.  Marland Kitchen Complete heart block ([place])    a. 11/2010 s/p SJM 2210 Accent DC PPM, ser# [id number].  . Depression   . Diabetes mellitus without complication ([place])   . Fall 11-10-14  . GERD (gastroesophageal reflux disease)   . History of prostate cancer   . Hyperlipidemia   . Hypertension   . LBBB (left bundle branch block)   . Left-sided Bell's palsy   . Lung cancer ([place]) 2016  . NICM (nonischemic cardiomyopathy) ([place])    a. 07/2014 Echo: EF 35-40%, mid-apicalanteroseptal DK, Gr 1 DD, mild-mod dil LA.  . Non-obstructive CAD    a. 07/2014 Abnl MV;  b. 08/2014 Cath: LM nl, LAD 30p, RI 40p, LCX nl, OM1 40, RCA dominant 30p, 70d-->Med Rx.  Marland Kitchen Poor balance   . Presence of permanent cardiac pacemaker   . Sleep apnea    a. cpap  . Vertigo   . WPW (Wolff-Parkinson-White syndrome)    a. S/P RFCA 1991.    Past Surgical History:  Procedure Laterality Date  . APPLICATION OF WOUND VAC Left 06/07/2015   Procedure: APPLICATION OF WOUND VAC;  Surgeon: Robert Bellow, MD;  Location: [facility] ORS;  Service: General;  Laterality: Left;  left upper back  . BACK SURGERY     2011  . CARDIAC CATHETERIZATION  08/26/2014   Single vessel obstructive CAD  . CARPAL TUNNEL RELEASE  04-04-15   [facility]  . CATARACT EXTRACTION  07-31-11 and 09-18-11  . Catheter ablation  1991   for WPW  . cervical fusion    . CHOLECYSTECTOMY  09-07-14  . HAND SURGERY     right 1993; left 2005  .  HERNIA REPAIR  1955  . INSERT / REPLACE / REMOVE PACEMAKER    . JOINT REPLACEMENT Left 2013   knee  . JOINT REPLACEMENT Right 2004   knee  . KNEE SURGERY     left knee 1991 and 1992; right knee 1995  . LEFT HEART CATHETERIZATION WITH CORONARY ANGIOGRAM N/A 08/26/2014   Procedure: LEFT HEART CATHETERIZATION WITH CORONARY ANGIOGRAM;  Surgeon: [name], MD;  Location: [facility] CATH LAB;  Service: Cardiovascular;  Laterality: N/A;  . LUMBAR LAMINECTOMY/DECOMPRESSION MICRODISCECTOMY N/A 06/07/2014   Procedure: LUMBAR FOUR TO FIVE LUMBAR LAMINECTOMY/DECOMPRESSION MICRODISCECTOMY 1 LEVEL;  Surgeon: [name], MD;  Location: [place] NEURO ORS;  Service: Neurosurgery;  Laterality: N/A;  . LUNG BIOPSY Right 2016   Dr [name]  . PACEMAKER INSERTION     PPM-- St Jude 11/30/10 by [name]  . PROSTATE SURGERY     cancer--1998, prostatectomy  . REPLACEMENT TOTAL KNEE     2004  . ruptured disc     1962 and 1998  . TRIGGER FINGER RELEASE  01-24-15  . WOUND DEBRIDEMENT Left 06/07/2015   Procedure: DEBRIDEMENT WOUND;  Surgeon: Robert Bellow, MD;  Location: [facility] ORS;  Service: General;  Laterality: Left;  left  upper back    There were no vitals filed for this visit.     R/L Hip Flexion 4+/4+ Hip ER 4-/4- Hip IR 5/5  Hip abd 4-/4- Hip ext 3+/3+ Knee flex 5/5 Knee ext 5/5 Ankle DF 4+/4+ Ankle PF; 2 bilat heel raises, unable to complete SL heel raises with UE support   Ther-Ex Extensive conversation with patient on lumbar stenosis and symptoms of this vs. Muscle weakness. Educated patient on importance of LE and core strengthening for stability. Encouraged compliance with strengthening program post PT and explained benefits as well as parameters Assessed MMT strength and educated patient on results and need for glute strengthening specifically for walking, STS, stair negotiation, etc.  - Bridge exercise 3x 10 with 50% of full ext and cuing for proper glute activation without LB compensation - Supine  clamshell w/ GTB 3x10 with cuing for eccentric control with good carry over - Standing hip abd with UE support at mat table 3x 8 each side with green tband and TC initially to prevent hip rotation with good carry over following                    PT Short Term Goals - 10/26/18 1425      PT SHORT TERM GOAL #1   Title  Pt will be independent with HEP in order to improve strength, flexibility, and balance in order to decrease fall risk and improve function at home and work.    Time  4    Period  Weeks    Status  Achieved        PT Long Term Goals - 10/26/18 1305      PT LONG TERM GOAL #1   Title  Patient will report ambulation over 10 min without LE numbess/weakness to be able to complete ADLs    Time  8    Period  Weeks    Status  Deferred      PT LONG TERM GOAL #2   Title  Pt will decrease 5TSTS by at least 3 seconds in order to demonstrate clinically significant improvement in LE strength    Baseline  10/26/18 12sec    Time  8    Period  Weeks    Status  Achieved      PT LONG TERM GOAL #3   Title  Patient will be able to ambulate uphill without LE sx in order to retrieve his morning paper    Baseline  11/419: 20ft up and back down (approx 20d grade) with patient reporting "no problem" doing this, reports he is able to complete at home, but has put a chair half way incase he needs to rest    Status  Achieved      PT LONG TERM GOAL #4   Title  Pt will increase 10MWT by at least 0.13 m/s in order to demonstrate clinically significant improvement in community ambulation.     Baseline  10/26/18 1.58m/s    Time  8    Period  Weeks    Status  On-going      PT LONG TERM GOAL #5   Title  Patient will ambulate 440ft in 2 mins to demonstrate age appropriate distance    Baseline  10/26/18 346ft before needing to sit down with LLE "giving out"; able to complete 563ft 25min 4sec    Time  8    Period  Weeks    Status  On-going  Patient will benefit  from skilled therapeutic intervention in order to improve the following deficits and impairments:     Visit Diagnosis: No diagnosis found.     Problem List Patient Active Problem List   Diagnosis Date Noted  . Malignant neoplasm of right lung ([place]) 04/18/2016  . CAD (coronary artery disease) 04/01/2016  . Adenocarcinoma ([place]) 04/01/2016  . Skin cyst 12/26/2015  . GERD (gastroesophageal reflux disease) 06/16/2015  . Abscess of back 06/06/2015  . Vertigo 03/27/2015  . Carpal tunnel syndrome 10/28/2014  . Status post cholecystectomy 09/29/2014  . Disease of digestive tract 09/29/2014  . Cardiomyopathy ([place]) 09/26/2014  . Type 2 diabetes mellitus without complications ([place])   . Sleep apnea   . Spinal stenosis, lumbar region, with neurogenic claudication 06/07/2014  . Lumbar stenosis with neurogenic claudication 06/07/2014  . Abnormal gait 08/20/2012  . H/O total knee replacement 08/20/2012  . Arthritis of knee, degenerative 08/20/2012  . Pacemaker-St.Jude 08/03/2012  . Cardiac conduction disorder 06/19/2012  . Acid reflux 06/18/2012  . 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 Triggering of digit 03/25/2012  . Hyperlipidemia 12/23/2011  . Essential hypertension 03/23/2011  . Atrioventricular block, complete ([place]) 03/23/2011  . Complete atrioventricular block ([place]) 03/23/2011    Shelton Silvas 11/10/2018, 9:07 AM  [facility] [address] [place], [address] Phone: [phone]   Fax:  [phone]  Name: Howard [name] MRN: [id number] Date of Birth: April 11, 1931

## 2018-11-10 NOTE — Therapy (Signed)
[facility] [address] [place], [address] Phone: [phone]   Fax:  [phone]  Physical Therapy Treatment  Patient Details  Name: Howard [name] MRN: [id number] Date of Birth: 1931/06/22 Referring Provider (PT): [name]   Encounter Date: 10/26/2018    Past Medical History:  Diagnosis Date  . Arthritis   . Bell palsy   . Cancer [facility])    prostate and skin  . Chronic combined systolic and diastolic CHF, NYHA class 1 ([place])    a. 07/2014 Echo: EF 35-40%, Gr 1 DD.  Marland Kitchen Complete heart block ([place])    a. 11/2010 s/p SJM 2210 Accent DC PPM, ser# [id number].  . Depression   . Diabetes mellitus without complication ([place])   . Fall 11-10-14  . GERD (gastroesophageal reflux disease)   . History of prostate cancer   . Hyperlipidemia   . Hypertension   . LBBB (left bundle branch block)   . Left-sided Bell's palsy   . Lung cancer ([place]) 2016  . NICM (nonischemic cardiomyopathy) ([place])    a. 07/2014 Echo: EF 35-40%, mid-apicalanteroseptal DK, Gr 1 DD, mild-mod dil LA.  . Non-obstructive CAD    a. 07/2014 Abnl MV;  b. 08/2014 Cath: LM nl, LAD 30p, RI 40p, LCX nl, OM1 40, RCA dominant 30p, 70d-->Med Rx.  Marland Kitchen Poor balance   . Presence of permanent cardiac pacemaker   . Sleep apnea    a. cpap  . Vertigo   . WPW (Wolff-Parkinson-White syndrome)    a. S/P RFCA 1991.    Past Surgical History:  Procedure Laterality Date  . APPLICATION OF WOUND VAC Left 06/07/2015   Procedure: APPLICATION OF WOUND VAC;  Surgeon: Robert Bellow, MD;  Location: [facility] ORS;  Service: General;  Laterality: Left;  left upper back  . BACK SURGERY     2011  . CARDIAC CATHETERIZATION  08/26/2014   Single vessel obstructive CAD  . CARPAL TUNNEL RELEASE  04-04-15   [facility]  . CATARACT EXTRACTION  07-31-11 and 09-18-11  . Catheter ablation  1991   for WPW  . cervical fusion    . CHOLECYSTECTOMY  09-07-14  . HAND SURGERY     right 1993; left 2005  .  HERNIA REPAIR  1955  . INSERT / REPLACE / REMOVE PACEMAKER    . JOINT REPLACEMENT Left 2013   knee  . JOINT REPLACEMENT Right 2004   knee  . KNEE SURGERY     left knee 1991 and 1992; right knee 1995  . LEFT HEART CATHETERIZATION WITH CORONARY ANGIOGRAM N/A 08/26/2014   Procedure: LEFT HEART CATHETERIZATION WITH CORONARY ANGIOGRAM;  Surgeon: [name], MD;  [facility] CATH LAB;  Service: Cardiovascular;  Laterality: N/A;  . LUMBAR LAMINECTOMY/DECOMPRESSION MICRODISCECTOMY N/A 06/07/2014   Procedure: LUMBAR FOUR TO FIVE LUMBAR LAMINECTOMY/DECOMPRESSION MICRODISCECTOMY 1 LEVEL;  Surgeon: [name], MD;  Location: [place] NEURO ORS;  Service: Neurosurgery;  Laterality: N/A;  . LUNG BIOPSY Right 2016   Dr [name]  . PACEMAKER INSERTION     PPM-- St Jude 11/30/10 by [name]  . PROSTATE SURGERY     cancer--1998, prostatectomy  . REPLACEMENT TOTAL KNEE     2004  . ruptured disc     1962 and 1998  . TRIGGER FINGER RELEASE  01-24-15  . WOUND DEBRIDEMENT Left 06/07/2015   Procedure: DEBRIDEMENT WOUND;  Surgeon: Robert Bellow, MD;  Location: [facility] ORS;  Service: General;  Laterality: Left;  left  upper back    There were no vitals filed for this visit.                              PT Short Term Goals - 10/26/18 1425      PT SHORT TERM GOAL #1   Title  Pt will be independent with HEP in order to improve strength, flexibility, and balance in order to decrease fall risk and improve function at home and work.    Time  4    Period  Weeks    Status  Achieved        PT Long Term Goals - 10/26/18 1305      PT LONG TERM GOAL #1   Title  Patient will report ambulation over 10 min without LE numbess/weakness to be able to complete ADLs    Time  8    Period  Weeks    Status  Deferred      PT LONG TERM GOAL #2   Title  Pt will decrease 5TSTS by at least 3 seconds in order to demonstrate clinically significant improvement in LE strength    Baseline  10/26/18 12sec    Time   8    Period  Weeks    Status  Achieved      PT LONG TERM GOAL #3   Title  Patient will be able to ambulate uphill without LE sx in order to retrieve his morning paper    Baseline  11/419: 44ft up and back down (approx 20d grade) with patient reporting "no problem" doing this, reports he is able to complete at home, but has put a chair half way incase he needs to rest    Status  Achieved      PT LONG TERM GOAL #4   Title  Pt will increase 10MWT by at least 0.13 m/s in order to demonstrate clinically significant improvement in community ambulation.     Baseline  10/26/18 1.47m/s    Time  8    Period  Weeks    Status  On-going      PT LONG TERM GOAL #5   Title  Patient will ambulate 422ft in 2 mins to demonstrate age appropriate distance    Baseline  10/26/18 341ft before needing to sit down with LLE "giving out"; able to complete 557ft 64min 4sec    Time  8    Period  Weeks    Status  On-going              Patient will benefit from skilled therapeutic intervention in order to improve the following deficits and impairments:  Abnormal gait, Decreased balance, Decreased endurance, Difficulty walking, Decreased range of motion, Improper body mechanics, Impaired tone, Decreased activity tolerance, Decreased strength, Increased fascial restricitons, Impaired flexibility, Postural dysfunction, Pain  Visit Diagnosis: Difficulty in walking, not elsewhere classified     Problem List Patient Active Problem List   Diagnosis Date Noted  . Malignant neoplasm of right lung ([place]) 04/18/2016  . CAD (coronary artery disease) 04/01/2016  . Adenocarcinoma ([place]) 04/01/2016  . Skin cyst 12/26/2015  . GERD (gastroesophageal reflux disease) 06/16/2015  . Abscess of back 06/06/2015  . Vertigo 03/27/2015  . Carpal tunnel syndrome 10/28/2014  . Status post cholecystectomy 09/29/2014  . Disease of digestive tract 09/29/2014  . Cardiomyopathy ([place]) 09/26/2014  . Type 2 diabetes mellitus without  complications ([place])   . Sleep apnea   .  Spinal stenosis, lumbar region, with neurogenic claudication 06/07/2014  . Lumbar stenosis with neurogenic claudication 06/07/2014  . Abnormal gait 08/20/2012  . H/O total knee replacement 08/20/2012  . Arthritis of knee, degenerative 08/20/2012  . Pacemaker-St.Jude 08/03/2012  . Cardiac conduction disorder 06/19/2012  . Acid reflux 06/18/2012  . 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 Triggering of digit 03/25/2012  . Hyperlipidemia 12/23/2011  . Essential hypertension 03/23/2011  . Atrioventricular block, complete ([place]) 03/23/2011  . Complete atrioventricular block ([place]) 03/23/2011    Shelton Silvas 11/10/2018, 9:05 AM  [facility] [address], [place], [address] Phone: [phone]   Fax:  [phone]  Name: Howard [name] MRN: [id number] Date of Birth: 1931/11/28

## 2018-11-11 DIAGNOSIS — D049 Carcinoma in situ of skin, unspecified: Secondary | ICD-10-CM | POA: Diagnosis not present

## 2018-11-11 DIAGNOSIS — D044 Carcinoma in situ of skin of scalp and neck: Secondary | ICD-10-CM | POA: Diagnosis not present

## 2018-11-17 ENCOUNTER — Encounter: Payer: Self-pay | Admitting: Physical Therapy

## 2018-11-17 ENCOUNTER — Ambulatory Visit: Payer: Medicare Other | Admitting: Physical Therapy

## 2018-11-17 DIAGNOSIS — M6281 Muscle weakness (generalized): Secondary | ICD-10-CM

## 2018-11-17 DIAGNOSIS — R262 Difficulty in walking, not elsewhere classified: Secondary | ICD-10-CM | POA: Diagnosis not present

## 2018-11-17 NOTE — Therapy (Signed)
[facility] [address], [place], [address] Phone: [phone]   Fax:  [phone]  Physical Therapy Treatment  Patient Details  Name: Howard [name] MRN: [id number] Date of Birth: 1931/04/12 Referring Provider (PT): Gollan   Encounter Date: 11/17/2018  PT End of Session - 11/17/18 1338    Visit Number  2    Number of Visits  9    Date for PT Re-Evaluation  12/08/18    PT Start Time  0130    PT Stop Time  0215    PT Time Calculation (min)  45 min    Activity Tolerance  Patient tolerated treatment well    Behavior During Therapy  [facility] for tasks assessed/performed       Past Medical History:  Diagnosis Date  . Arthritis   . Bell palsy   . Cancer [facility])    prostate and skin  . Chronic combined systolic and diastolic CHF, NYHA class 1 ([place])    a. 07/2014 Echo: EF 35-40%, Gr 1 DD.  Marland Kitchen Complete heart block ([place])    a. 11/2010 s/p SJM 2210 Accent DC PPM, ser# [id number].  . Depression   . Diabetes mellitus without complication ([place])   . Fall 11-10-14  . GERD (gastroesophageal reflux disease)   . History of prostate cancer   . Hyperlipidemia   . Hypertension   . LBBB (left bundle branch block)   . Left-sided Bell's palsy   . Lung cancer ([place]) 2016  . NICM (nonischemic cardiomyopathy) ([place])    a. 07/2014 Echo: EF 35-40%, mid-apicalanteroseptal DK, Gr 1 DD, mild-mod dil LA.  . Non-obstructive CAD    a. 07/2014 Abnl MV;  b. 08/2014 Cath: LM nl, LAD 30p, RI 40p, LCX nl, OM1 40, RCA dominant 30p, 70d-->Med Rx.  Marland Kitchen Poor balance   . Presence of permanent cardiac pacemaker   . Sleep apnea    a. cpap  . Vertigo   . WPW (Wolff-Parkinson-White syndrome)    a. S/P RFCA 1991.    Past Surgical History:  Procedure Laterality Date  . APPLICATION OF WOUND VAC Left 06/07/2015   Procedure: APPLICATION OF WOUND VAC;  Surgeon: Robert Bellow, MD;  Location: [facility] ORS;  Service: General;  Laterality: Left;  left upper back   . BACK SURGERY     2011  . CARDIAC CATHETERIZATION  08/26/2014   Single vessel obstructive CAD  . CARPAL TUNNEL RELEASE  04-04-15   [facility]  . CATARACT EXTRACTION  07-31-11 and 09-18-11  . Catheter ablation  1991   for WPW  . cervical fusion    . CHOLECYSTECTOMY  09-07-14  . HAND SURGERY     right 1993; left 2005  . HERNIA REPAIR  1955  . INSERT / REPLACE / REMOVE PACEMAKER    . JOINT REPLACEMENT Left 2013   knee  . JOINT REPLACEMENT Right 2004   knee  . KNEE SURGERY     left knee 1991 and 1992; right knee 1995  . LEFT HEART CATHETERIZATION WITH CORONARY ANGIOGRAM N/A 08/26/2014   Procedure: LEFT HEART CATHETERIZATION WITH CORONARY ANGIOGRAM;  Surgeon: [name], MD;  Location: [facility] CATH LAB;  Service: Cardiovascular;  Laterality: N/A;  . LUMBAR LAMINECTOMY/DECOMPRESSION MICRODISCECTOMY N/A 06/07/2014   Procedure: LUMBAR FOUR TO FIVE LUMBAR LAMINECTOMY/DECOMPRESSION MICRODISCECTOMY 1 LEVEL;  Surgeon: [name], MD;  Location: [facility] NEURO ORS;  Service: Neurosurgery;  Laterality: N/A;  . LUNG BIOPSY Right 2016   Dr  Munster     PPM-- St Jude 11/30/10 by [name]  . PROSTATE SURGERY     cancer--1998, prostatectomy  . REPLACEMENT TOTAL KNEE     2004  . ruptured disc     1962 and 1998  . TRIGGER FINGER RELEASE  01-24-15  . WOUND DEBRIDEMENT Left 06/07/2015   Procedure: DEBRIDEMENT WOUND;  Surgeon: Robert Bellow, MD;  Location: [facility] ORS;  Service: General;  Laterality: Left;  left upper back    There were no vitals filed for this visit.  Subjective Assessment - 11/17/18 1336    Subjective  Patient reports he had a flare up with his LB this weekend on the R side that did not radiate into R LE. Patient reports compliance with his HEP with no questions or concerns.     Pertinent History  Patient is a 84 year old male reporting iwth LBP with LE weakness. Patient has hx of spinal stenosis, and and is currently receiving injections for LE weakness. Patient reports he has some L  sided LBP in mornings, but his cheif complaint is his bilat LE numbess and weakness that is occuring after he walks for a period of time. Patient reports [place] and cannot walk up and down his driveway without the legs feeling like they "are going to give out". Pt denies N/V, B&B changes, unexplained weight fluctuation, saddle paresthesia, fever, night sweats, or unrelenting night pain at this time.    How long can you sit comfortably?  unlimited    How long can you stand comfortably?  74mins    How long can you walk comfortably?  2 min    Patient Stated Goals  Walk for extended period of time, walk up/down the driveway    Pain Onset  More than a month ago       Ther-Ex - Nustep 6 min L3 for safe increased muscle endurance training - Mini squat 3x 8 with demo and max cuing initially with 75% carry over following - Hip ext with RTB 3x 10 each side with demo and cuing for proper form with glute contraction w/o forward trunk lean compensation and eccentric control  - Backward walking, attempted on treadmill, safety issue - Backward walking 3x 20steps with step-pause-push for hip ext activation with CGA from PT for balance safety, and cuing for "big step" for full hip ext - Side stepping with YTB 3x 12 (6steps R/ 6steps L) with PT HHA and cuing for foot clearance and eccentric control                        PT Education - 11/17/18 1338    Education Details  Exercise form    Person(s) Educated  Patient    Methods  Explanation;Tactile cues;Verbal cues    Comprehension  Verbalized understanding;Verbal cues required;Tactile cues required       PT Short Term Goals - 10/26/18 1425      PT SHORT TERM GOAL #1   Title  Pt will be independent with HEP in order to improve strength, flexibility, and balance in order to decrease fall risk and improve function at home and work.    Time  4    Period  Weeks    Status  Achieved        PT Long Term Goals - 11/10/18 0912       PT LONG TERM GOAL #1   Title  Patient will  report ambulation over 10 min without LE numbess/weakness to be able to complete ADLs    Baseline  09/29/18 65min 38sec covering 344ft    Time  8    Period  Weeks    Status  Deferred      PT LONG TERM GOAL #2   Title  Pt will decrease 5TSTS by at least 3 seconds in order to demonstrate clinically significant improvement in LE strength    Baseline  10/26/18 12sec    Time  8    Period  Weeks    Status  Achieved      PT LONG TERM GOAL #3   Title  Patient will be able to ambulate uphill without LE sx in order to retrieve his morning paper    Baseline  11/419: 63ft up and back down (approx 20d grade) with patient reporting "no problem" doing this, reports he is able to complete at home, but has put a chair half way incase he needs to rest    Time  8    Period  Weeks    Status  Achieved      PT LONG TERM GOAL #4   Title  Pt will increase 10MWT by at least 0.13 m/s in order to demonstrate clinically significant improvement in community ambulation.     Baseline  10/26/18 1.39m/s    Time  8    Period  Weeks    Status  On-going      PT LONG TERM GOAL #5   Title  Patient will ambulate 415ft in 2 mins to demonstrate age appropriate distance    Baseline  10/26/18 360ft before needing to sit down with LLE "giving out"; able to complete 547ft 30min 4sec    Time  8    Period  Weeks    Status  On-going      Additional Long Term Goals   Additional Long Term Goals  Yes      PT LONG TERM GOAL #6   Title  Pt will increase strength by at least 1/2 MMT grade in order to demonstrate improvement in strength and function    Baseline  11/10/18: hip flex 4+ b/; Hip ER 4- b/l; Hip abd 4- b/l; Hip ext 3+ b/l; Ankle DF 4+/4+; Ankle PF; 2 bilat heel raises, unable to complete SL heel raises with UE support            Plan - 11/17/18 1413    Clinical Impression Statement  Patient is able to complete therex progression wtih no increased pain, only muscle  fatigue, requiring seated rest breaks. Patient requires cuing for proper form/activiation throughtout therex, which he is able to comply with. Patient requires some gaurding for safety as well. Patient will continue to benefit from skilled PT to progress therex safely to increase LE and trunk strength.     Rehab Potential  Fair    Clinical Impairments Affecting Rehab Potential  (+) social support, motivation (-) age, chronicity of pain, other comorbidities, multiple failed attempts at pain relief (injection, sx, past PT)    PT Frequency  2x / week    PT Duration  8 weeks    PT Treatment/Interventions  ADLs/Self Care Home Management;Cryotherapy;Traction;Gait training;Therapeutic exercise;Patient/family education;Manual techniques;Passive range of motion;Dry needling;Taping;Neuromuscular re-education;Balance training;Stair training;Electrical Stimulation;Ultrasound;Aquatic Therapy;Moist Heat;Iontophoresis 4mg /ml Dexamethasone;Therapeutic activities    PT Next Visit Plan  hip/core strengthening, aerobic progression    PT Home Exercise Plan  Mini squat, standing abd, YTB, standing ext YTB, Standing heel raise  Consulted and Agree with Plan of Care  Patient       Patient will benefit from skilled therapeutic intervention in order to improve the following deficits and impairments:  Abnormal gait, Decreased balance, Decreased endurance, Difficulty walking, Decreased range of motion, Improper body mechanics, Impaired tone, Decreased activity tolerance, Decreased strength, Increased fascial restricitons, Impaired flexibility, Postural dysfunction, Pain  Visit Diagnosis: Muscle weakness (generalized)  Difficulty in walking, not elsewhere classified     Problem List Patient Active Problem List   Diagnosis Date Noted  . Malignant neoplasm of right lung ([place]) 04/18/2016  . CAD (coronary artery disease) 04/01/2016  . Adenocarcinoma ([place]) 04/01/2016  . Skin cyst 12/26/2015  . GERD (gastroesophageal  reflux disease) 06/16/2015  . Abscess of back 06/06/2015  . Vertigo 03/27/2015  . Carpal tunnel syndrome 10/28/2014  . Status post cholecystectomy 09/29/2014  . Disease of digestive tract 09/29/2014  . Cardiomyopathy ([place]) 09/26/2014  . Type 2 diabetes mellitus without complications ([place])   . Sleep apnea   . Spinal stenosis, lumbar region, with neurogenic claudication 06/07/2014  . Lumbar stenosis with neurogenic claudication 06/07/2014  . Abnormal gait 08/20/2012  . H/O total knee replacement 08/20/2012  . Arthritis of knee, degenerative 08/20/2012  . Pacemaker-St.Jude 08/03/2012  . Cardiac conduction disorder 06/19/2012  . Acid reflux 06/18/2012  . 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 Triggering of digit 03/25/2012  . Hyperlipidemia 12/23/2011  . Essential hypertension 03/23/2011  . Atrioventricular block, complete ([place]) 03/23/2011  . Complete atrioventricular block ([place]) 03/23/2011   Shelton Silvas PT, DPT Shelton Silvas 11/17/2018, 2:17 PM  [facility] [address], [place], [address] Phone: [phone]   Fax:  [phone]  Name: Howard [name] MRN: [id number] Date of Birth: 03-Jan-1931

## 2018-11-24 ENCOUNTER — Other Ambulatory Visit: Payer: Self-pay | Admitting: Cardiovascular Disease

## 2018-11-24 ENCOUNTER — Ambulatory Visit: Payer: Medicare Other | Attending: Cardiovascular Disease | Admitting: Physical Therapy

## 2018-11-24 ENCOUNTER — Telehealth: Payer: Self-pay | Admitting: Cardiovascular Disease

## 2018-11-24 ENCOUNTER — Encounter: Payer: Self-pay | Admitting: Physical Therapy

## 2018-11-24 DIAGNOSIS — M6281 Muscle weakness (generalized): Secondary | ICD-10-CM | POA: Diagnosis not present

## 2018-11-24 DIAGNOSIS — R262 Difficulty in walking, not elsewhere classified: Secondary | ICD-10-CM | POA: Insufficient documentation

## 2018-11-24 MED ORDER — SPIRONOLACTONE 50 MG PO TABS: ORAL_TABLET | ORAL | 3 refills | Status: DC

## 2018-11-24 MED ORDER — CHLORTHALIDONE 25 MG PO TABS: 25.0000 mg | ORAL_TABLET | Freq: Every day | ORAL | 3 refills | Status: DC

## 2018-11-24 NOTE — Telephone Encounter (Signed)
Per recent MyChart message,  Dr. [name] adjusted the patient's BP medications:   11/23/18:  1) STOP HCTZ 2) START chlorthalidone 25 mg once daily 3) INCREASE spironolactone to 50 mg once daily  Will sent RX updates to [facility].   I spoke with the patient's wife and she is aware RX's have been sent to the pharmacy.

## 2018-11-24 NOTE — Telephone Encounter (Signed)
Pt c/o medication issue:  1. Name of Medication: Chlorthalidone 25 mg po q d and spironolactone 50 mg po q d   2. How are you currently taking this medication (dosage and times per day)? Changes per my chart note   3. Are you having a reaction (difficulty breathing--STAT)? No   4. What is your medication issue? [facility]

## 2018-11-24 NOTE — Therapy (Signed)
[facility] [address] [place], [address] Phone: [phone]   Fax:  [phone]  Physical Therapy Treatment  Patient Details  Name: Howard [name] MRN: [id number] Date of Birth: [birth year] Referring Provider (PT): Gollan   Encounter Date: 11/24/2018  PT End of Session - 11/24/18 1624    Visit Number  3    Number of Visits  9    Date for PT Re-Evaluation  12/08/18    PT Start Time  0420    PT Stop Time  0500    PT Time Calculation (min)  40 min    Equipment Utilized During Treatment  Gait belt    Activity Tolerance  Patient tolerated treatment well    Behavior During Therapy  [facility] for tasks assessed/performed       Past Medical History:  Diagnosis Date  . Arthritis   . Bell palsy   . Cancer [facility])    prostate and skin  . Chronic combined systolic and diastolic CHF, NYHA class 1 ([place])    a. 07/2014 Echo: EF 35-40%, Gr 1 DD.  Marland Kitchen Complete heart block ([place])    a. 11/2010 s/p SJM 2210 Accent DC PPM, ser# [id number].  . Depression   . Diabetes mellitus without complication ([place])   . Fall 11-10-14  . GERD (gastroesophageal reflux disease)   . History of prostate cancer   . Hyperlipidemia   . Hypertension   . LBBB (left bundle branch block)   . Left-sided Bell's palsy   . Lung cancer ([place]) 2016  . NICM (nonischemic cardiomyopathy) ([place])    a. 07/2014 Echo: EF 35-40%, mid-apicalanteroseptal DK, Gr 1 DD, mild-mod dil LA.  . Non-obstructive CAD    a. 07/2014 Abnl MV;  b. 08/2014 Cath: LM nl, LAD 30p, RI 40p, LCX nl, OM1 40, RCA dominant 30p, 70d-->Med Rx.  Marland Kitchen Poor balance   . Presence of permanent cardiac pacemaker   . Sleep apnea    a. cpap  . Vertigo   . WPW (Wolff-Parkinson-White syndrome)    a. S/P RFCA 1991.    Past Surgical History:  Procedure Laterality Date  . APPLICATION OF WOUND VAC Left 06/07/2015   Procedure: APPLICATION OF WOUND VAC;  Surgeon: Robert Bellow, MD;  Location: [facility] ORS;   Service: General;  Laterality: Left;  left upper back  . BACK SURGERY     2011  . CARDIAC CATHETERIZATION  08/26/2014   Single vessel obstructive CAD  . CARPAL TUNNEL RELEASE  04-04-15   [facility]  . CATARACT EXTRACTION  07-31-11 and 09-18-11  . Catheter ablation  1991   for WPW  . cervical fusion    . CHOLECYSTECTOMY  09-07-14  . HAND SURGERY     right 1993; left 2005  . HERNIA REPAIR  1955  . INSERT / REPLACE / REMOVE PACEMAKER    . JOINT REPLACEMENT Left 2013   knee  . JOINT REPLACEMENT Right 2004   knee  . KNEE SURGERY     left knee 1991 and 1992; right knee 1995  . LEFT HEART CATHETERIZATION WITH CORONARY ANGIOGRAM N/A 08/26/2014   Procedure: LEFT HEART CATHETERIZATION WITH CORONARY ANGIOGRAM;  Surgeon: [name], MD;  Location: [facility] CATH LAB;  Service: Cardiovascular;  Laterality: N/A;  . LUMBAR LAMINECTOMY/DECOMPRESSION MICRODISCECTOMY N/A 06/07/2014   Procedure: LUMBAR FOUR TO FIVE LUMBAR LAMINECTOMY/DECOMPRESSION MICRODISCECTOMY 1 LEVEL;  Surgeon: [name], MD;  Location: [facility] NEURO ORS;  Service: Neurosurgery;  Laterality:  N/A;  . LUNG BIOPSY Right 2016   Dr [name]  . PACEMAKER INSERTION     PPM-- St Jude 11/30/10 by [name]  . PROSTATE SURGERY     cancer--1998, prostatectomy  . REPLACEMENT TOTAL KNEE     2004  . ruptured disc     1962 and 1998  . TRIGGER FINGER RELEASE  01-24-15  . WOUND DEBRIDEMENT Left 06/07/2015   Procedure: DEBRIDEMENT WOUND;  Surgeon: Robert Bellow, MD;  Location: [facility] ORS;  Service: General;  Laterality: Left;  left upper back    There were no vitals filed for this visit.  Subjective Assessment - 11/24/18 1622    Subjective  Patient rpeorts he has been walking around his house and into restaurants without AD and reports no pain or "catcing". Patient reports he is very happy with this progress. Reports compliance with HEP with no questions or concerns.     Pertinent History  Patient is a 84 year old male reporting iwth LBP with LE weakness. Patient has hx  of spinal stenosis, and and is currently receiving injections for LE weakness. Patient reports he has some L sided LBP in mornings, but his cheif complaint is his bilat LE numbess and weakness that is occuring after he walks for a period of time. Patient reports [place] and cannot walk up and down his driveway without the legs feeling like they "are going to give out". Pt denies N/V, B&B changes, unexplained weight fluctuation, saddle paresthesia, fever, night sweats, or unrelenting night pain at this time.    How long can you sit comfortably?  unlimited    How long can you stand comfortably?  54mins    How long can you walk comfortably?  2 min    Patient Stated Goals  Walk for extended period of time, walk up/down the driveway    Pain Onset  More than a month ago       Ther-Ex - Nustep 6 min L4 for safe increased muscle endurance training - Mini squat 3x 10 with good carry over from last session with min cuing for proper form with full hip ext in standing phase - Multiple bouts of ball toss with chair behind patient for safety, therapist throwing ball outside BOS to promote reaching/dynamic balance and inc standing tolerance; adding airex pad over 2 trials with seated rest breaks between each set - Side stepping with squat YTB 3x 10-10-8 (5-5-4steps +squat R/ 5-5-4 steps + squat L) with PT HHA and cuing for foot clearance and eccentric control - Square stepping with exaggerated foot clearance 2x 2xclockwise/2xcounterclockwise with CGA with gait belt for safety, inc postural sway with backward stepping                        PT Education - 11/24/18 1624    Education Details  Exercise form    Person(s) Educated  Patient    Methods  Explanation;Verbal cues;Demonstration    Comprehension  Verbalized understanding;Verbal cues required;Returned demonstration       PT Short Term Goals - 10/26/18 1425      PT SHORT TERM GOAL #1   Title  Pt will be independent with  HEP in order to improve strength, flexibility, and balance in order to decrease fall risk and improve function at home and work.    Time  4    Period  Weeks    Status  Achieved  PT Long Term Goals - 11/10/18 0912      PT LONG TERM GOAL #1   Title  Patient will report ambulation over 10 min without LE numbess/weakness to be able to complete ADLs    Baseline  09/29/18 30min 38sec covering 376ft    Time  8    Period  Weeks    Status  Deferred      PT LONG TERM GOAL #2   Title  Pt will decrease 5TSTS by at least 3 seconds in order to demonstrate clinically significant improvement in LE strength    Baseline  10/26/18 12sec    Time  8    Period  Weeks    Status  Achieved      PT LONG TERM GOAL #3   Title  Patient will be able to ambulate uphill without LE sx in order to retrieve his morning paper    Baseline  11/419: 32ft up and back down (approx 20d grade) with patient reporting "no problem" doing this, reports he is able to complete at home, but has put a chair half way incase he needs to rest    Time  8    Period  Weeks    Status  Achieved      PT LONG TERM GOAL #4   Title  Pt will increase 10MWT by at least 0.13 m/s in order to demonstrate clinically significant improvement in community ambulation.     Baseline  10/26/18 1.42m/s    Time  8    Period  Weeks    Status  On-going      PT LONG TERM GOAL #5   Title  Patient will ambulate 472ft in 2 mins to demonstrate age appropriate distance    Baseline  10/26/18 332ft before needing to sit down with LLE "giving out"; able to complete 571ft 62min 4sec    Time  8    Period  Weeks    Status  On-going      Additional Long Term Goals   Additional Long Term Goals  Yes      PT LONG TERM GOAL #6   Title  Pt will increase strength by at least 1/2 MMT grade in order to demonstrate improvement in strength and function    Baseline  11/10/18: hip flex 4+ b/; Hip ER 4- b/l; Hip abd 4- b/l; Hip ext 3+ b/l; Ankle DF 4+/4+; Ankle PF; 2  bilat heel raises, unable to complete SL heel raises with UE support            Plan - 11/24/18 1643    Clinical Impression Statement  PT was able to progress LE with dynamic balance as patient is reporting minimal pain today. Patient does require seated rest between sets d/t muscle fatigue, but reports no pain or "catching/buckling" throughout session. PT instructed patient to follow up with PT next week on if he is planning to return to hospital gym with wife (reports they were going 3x/week before he "could not walk as far") or would like to complete HEP to maintain strengthening gains for home use only. Pt will continue to benefit from skilled PT to address LE weakness and balance deficits before educating patient on robust HEP.     Rehab Potential  Fair    Clinical Impairments Affecting Rehab Potential  (+) social support, motivation (-) age, chronicity of pain, other comorbidities, multiple failed attempts at pain relief (injection, sx, past PT)    PT Frequency  2x /  week    PT Duration  8 weeks    PT Treatment/Interventions  ADLs/Self Care Home Management;Cryotherapy;Traction;Gait training;Therapeutic exercise;Patient/family education;Manual techniques;Passive range of motion;Dry needling;Taping;Neuromuscular re-education;Balance training;Stair training;Electrical Stimulation;Ultrasound;Aquatic Therapy;Moist Heat;Iontophoresis 4mg /ml Dexamethasone;Therapeutic activities    PT Next Visit Plan  hip/core strengthening, aerobic progression    PT Home Exercise Plan  Mini squat, standing abd, YTB, standing ext YTB, Standing heel raise    Consulted and Agree with Plan of Care  Patient       Patient will benefit from skilled therapeutic intervention in order to improve the following deficits and impairments:  Abnormal gait, Decreased balance, Decreased endurance, Difficulty walking, Decreased range of motion, Improper body mechanics, Impaired tone, Decreased activity tolerance, Decreased  strength, Increased fascial restricitons, Impaired flexibility, Postural dysfunction, Pain  Visit Diagnosis: Muscle weakness (generalized)  Difficulty in walking, not elsewhere classified     Problem List Patient Active Problem List   Diagnosis Date Noted  . Malignant neoplasm of right lung ([place]) 04/18/2016  . CAD (coronary artery disease) 04/01/2016  . Adenocarcinoma ([place]) 04/01/2016  . Skin cyst 12/26/2015  . GERD (gastroesophageal reflux disease) 06/16/2015  . Abscess of back 06/06/2015  . Vertigo 03/27/2015  . Carpal tunnel syndrome 10/28/2014  . Status post cholecystectomy 09/29/2014  . Disease of digestive tract 09/29/2014  . Cardiomyopathy ([place]) 09/26/2014  . Type 2 diabetes mellitus without complications ([place])   . Sleep apnea   . Spinal stenosis, lumbar region, with neurogenic claudication 06/07/2014  . Lumbar stenosis with neurogenic claudication 06/07/2014  . Abnormal gait 08/20/2012  . H/O total knee replacement 08/20/2012  . Arthritis of knee, degenerative 08/20/2012  . Pacemaker-St.Jude 08/03/2012  . Cardiac conduction disorder 06/19/2012  . Acid reflux 06/18/2012  . 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 Triggering of digit 03/25/2012  . Hyperlipidemia 12/23/2011  . Essential hypertension 03/23/2011  . Atrioventricular block, complete ([place]) 03/23/2011  . Complete atrioventricular block ([place]) 03/23/2011   Shelton Silvas PT, DPT Shelton Silvas 11/24/2018, 4:52 PM  [facility] [address] [place], [address] Phone: [phone]   Fax:  [phone]  Name: Howard [name] MRN: [id number] Date of Birth: 1931-10-05

## 2018-11-25 NOTE — Telephone Encounter (Signed)
Pt c/o medication issue:  1. Name of Medication: all meds   2. How are you currently taking this medication (dosage and times per day)?   3. Are you having a reaction (difficulty breathing--STAT)? No   4. What is your medication issue? Recent med change wife wants to go over what and when he is to take now.  Please call to go over med list

## 2018-11-25 NOTE — Progress Notes (Signed)
Patient: Howard [name] Male    DOB: June 02, 1931   84 y.o.   MRN: [id number] Visit Date: 11/27/2018  Today's Provider: [name], PA   Chief Complaint  Patient presents with  . Follow-up  . Diabetes  . Hypertension  . Hyperlipidemia   Subjective:    HPI   Diabetes Mellitus Type II, Follow-up:   Lab Results  Component Value Date   HGBA1C 7.7 (A) 11/27/2018   HGBA1C 7.6 (A) 08/25/2018   HGBA1C 7.6 (H) 05/05/2018   Last seen for diabetes 3 months ago.  Management since then includes; no changes. He reports good compliance with treatment. He is not having side effects. none Current symptoms include none and have been unchanged. Home blood sugar records: fasting range 130  Episodes of hypoglycemia? no   Current Insulin Regimen: n/a Most Recent Eye Exam: 05/21/2018 Weight trend: stable Prior visit with dietician: no Current diet: well balanced Current exercise: no regular exercise  ---------------------------------------------------------------    Lipid/Cholesterol, Follow-up:   Last seen for this 3 months ago.  Management since that visit includes; labs checked, no changes.  Last Lipid Panel:    Component Value Date/Time   CHOL 138 08/25/2018 1053   TRIG 152 (H) 08/25/2018 1053   HDL 46 08/25/2018 1053   CHOLHDL 3.0 08/25/2018 1053   CHOLHDL 2.8 09/30/2017 1003   VLDL 23 11/30/2010 0506   LDLCALC 62 08/25/2018 1053   LDLCALC 63 09/30/2017 1003    He reports good compliance with treatment. He is not having side effects. none  Wt Readings from Last 3 Encounters:  11/27/18 194 lb (88 kg)  11/04/18 204 lb 9.6 oz (92.8 kg)  11/03/18 195 lb (88.5 kg)    ---------------------------------------------------------------  Past Medical History:  Diagnosis Date  . Arthritis   . Bell palsy   . Cancer [facility])    prostate and skin  . Chronic combined systolic and diastolic CHF, NYHA class 1 ([place])    a. 07/2014 Echo: EF 35-40%, Gr 1 DD.  Marland Kitchen Complete  heart block ([place])    a. 11/2010 s/p SJM 2210 Accent DC PPM, ser# [id number].  . Depression   . Diabetes mellitus without complication ([place])   . Fall 11-10-14  . GERD (gastroesophageal reflux disease)   . History of prostate cancer   . Hyperlipidemia   . Hypertension   . LBBB (left bundle branch block)   . Left-sided Bell's palsy   . Lung cancer ([place]) 2016  . NICM (nonischemic cardiomyopathy) ([place])    a. 07/2014 Echo: EF 35-40%, mid-apicalanteroseptal DK, Gr 1 DD, mild-mod dil LA.  . Non-obstructive CAD    a. 07/2014 Abnl MV;  b. 08/2014 Cath: LM nl, LAD 30p, RI 40p, LCX nl, OM1 40, RCA dominant 30p, 70d-->Med Rx.  Marland Kitchen Poor balance   . Presence of permanent cardiac pacemaker   . Sleep apnea    a. cpap  . Vertigo   . WPW (Wolff-Parkinson-White syndrome)    a. S/P RFCA 1991.   Past Surgical History:  Procedure Laterality Date  . APPLICATION OF WOUND VAC Left 06/07/2015   Procedure: APPLICATION OF WOUND VAC;  Surgeon: Robert Bellow, MD;  Location: [facility] ORS;  Service: General;  Laterality: Left;  left upper back  . BACK SURGERY     2011  . CARDIAC CATHETERIZATION  08/26/2014   Single vessel obstructive CAD  . CARPAL TUNNEL RELEASE  04-04-15   [facility]  . CATARACT EXTRACTION  07-31-11 and  09-18-11  . Catheter ablation  1991   for WPW  . cervical fusion    . CHOLECYSTECTOMY  09-07-14  . HAND SURGERY     right 1993; left 2005  . HERNIA REPAIR  1955  . INSERT / REPLACE / REMOVE PACEMAKER    . JOINT REPLACEMENT Left 2013   knee  . JOINT REPLACEMENT Right 2004   knee  . KNEE SURGERY     left knee 1991 and 1992; right knee 1995  . LEFT HEART CATHETERIZATION WITH CORONARY ANGIOGRAM N/A 08/26/2014   Procedure: LEFT HEART CATHETERIZATION WITH CORONARY ANGIOGRAM;  Surgeon: [name], MD;  Location: [facility] CATH LAB;  Service: Cardiovascular;  Laterality: N/A;  . LUMBAR LAMINECTOMY/DECOMPRESSION MICRODISCECTOMY N/A 06/07/2014   Procedure: LUMBAR FOUR TO FIVE LUMBAR LAMINECTOMY/DECOMPRESSION  MICRODISCECTOMY 1 LEVEL;  Surgeon: [name], MD;  Location: [place] NEURO ORS;  Service: Neurosurgery;  Laterality: N/A;  . LUNG BIOPSY Right 2016   Dr [name]  . PACEMAKER INSERTION     PPM-- St Jude 11/30/10 by [name]  . PROSTATE SURGERY     cancer--1998, prostatectomy  . REPLACEMENT TOTAL KNEE     2004  . ruptured disc     1962 and 1998  . TRIGGER FINGER RELEASE  01-24-15  . WOUND DEBRIDEMENT Left 06/07/2015   Procedure: DEBRIDEMENT WOUND;  Surgeon: Robert Bellow, MD;  Location: [facility] ORS;  Service: General;  Laterality: Left;  left upper back   Family History  Problem Relation Age of Onset  . Heart attack Mother   . Hyperlipidemia Mother   . CAD Unknown   . Prostate cancer Neg Hx    Allergies  Allergen Reactions  . Sulfa Antibiotics Rash  . Sulfonamide Derivatives Rash    Current Outpatient Medications:  .  aspirin 81 MG tablet, Take 81 mg by mouth daily.  , Disp: , Rfl:  .  Cholecalciferol (VITAMIN D3) 1000 UNITS CAPS, Take 1,000 Units by mouth daily. , Disp: , Rfl:  .  ENTRESTO 97-103 MG, TAKE 1 TABLET TWICE A DAY, Disp: 180 tablet, Rfl: 0 .  fluticasone (FLONASE) 50 MCG/ACT nasal spray, Place 2 sprays into both nostrils daily., Disp: , Rfl:  .  isosorbide mononitrate (IMDUR) 30 MG 24 hr tablet, Take 1 tablet (30 mg total) by mouth daily., Disp: 90 tablet, Rfl: 3 .  metFORMIN (GLUCOPHAGE) 500 MG tablet, TAKE 1 TABLET TWICE A DAY  WITH MEALS, Disp: 180 tablet, Rfl: 3 .  metoprolol succinate (TOPROL-XL) 25 MG 24 hr tablet, Take 1 tablet (25 mg) by mouth once daily at bedtime, Disp: 90 tablet, Rfl: 3 .  Multiple Vitamin (MULTIVITAMIN) capsule, Take 1 capsule by mouth daily.  , Disp: , Rfl:  .  omeprazole (PRILOSEC) 40 MG capsule, Take 1 capsule (40 mg total) by mouth daily. (Patient taking differently: Take 40 mg by mouth 2 (two) times daily. ), Disp: 90 capsule, Rfl: 3 .  psyllium (METAMUCIL) 58.6 % powder, Take 1 packet by mouth 3 (three) times daily., Disp: , Rfl:  .  simvastatin  (ZOCOR) 40 MG tablet, TAKE 1 TABLET AT BEDTIME, Disp: 90 tablet, Rfl: 3 .  spironolactone (ALDACTONE) 50 MG tablet, Take 1 tablet (50 mg) by mouth once daily in the morning, Disp: 90 tablet, Rfl: 3 .  albuterol (PROVENTIL HFA;VENTOLIN HFA) 108 (90 Base) MCG/ACT inhaler, Inhale 1-2 puffs into the lungs every 6 (six) hours as needed for wheezing or shortness of breath., Disp: 1 Inhaler, Rfl: 0 .  chlorthalidone (  HYGROTON) 25 MG tablet, Take 1 tablet (25 mg total) by mouth daily., Disp: 90 tablet, Rfl: 3 .  montelukast (SINGULAIR) 10 MG tablet, Take 10 mg by mouth at bedtime., Disp: , Rfl:   Review of Systems  Constitutional: Negative for appetite change, chills and fever.  Respiratory: Negative for chest tightness, shortness of breath and wheezing.   Cardiovascular: Negative for chest pain and palpitations.  Gastrointestinal: Negative for abdominal pain, nausea and vomiting.   Social History   Tobacco Use  . Smoking status: Former Smoker    Years: 4.00  . Smokeless tobacco: Never Used  . Tobacco comment: Quit 2011  Substance Use Topics  . Alcohol use: No    Frequency: Never   Objective:   BP 129/69 (BP Location: Right Arm, Patient Position: Sitting, Cuff Size: Large)   Pulse 71   Temp 97.9 F (36.6 C) (Oral)   Resp 16   Wt 194 lb (88 kg)   SpO2 96%   BMI 27.84 kg/m  Vitals:   11/27/18 0822  BP: 129/69  Pulse: 71  Resp: 16  Temp: 97.9 F (36.6 C)  TempSrc: Oral  SpO2: 96%  Weight: 194 lb (88 kg)   Physical Exam  Constitutional: He is oriented to person, place, and time. He appears well-developed and well-nourished. No distress.  HENT:  Head: Normocephalic and atraumatic.  Right Ear: Hearing normal.  Left Ear: Hearing normal.  Nose: Nose normal.  Eyes: Conjunctivae and lids are normal. Right eye exhibits no discharge. Left eye exhibits no discharge. No scleral icterus.  Cardiovascular: Normal rate and regular rhythm.  Pulmonary/Chest: Effort normal. No respiratory  distress.  Abdominal: Soft. Bowel sounds are normal.  Musculoskeletal: Normal range of motion.  Neurological: He is alert and oriented to person, place, and time.  Skin: Skin is intact. No lesion and no rash noted.  Psychiatric: He has a normal mood and affect. His speech is normal and behavior is normal. Thought content normal.   Diabetic Foot Form - Detailed   Diabetic Foot Exam - detailed Diabetic Foot exam was performed with the following findings:  Yes 11/27/2018  8:51 AM  Visual Foot Exam completed.:  Yes  Can the patient see the bottom of their feet?:  Yes Are the shoes appropriate in style and fit?:  Yes Is there swelling or and abnormal foot shape?:  No Is there a claw toe deformity?:  No Is there elevated skin temparature?:  No Is there foot or ankle muscle weakness?:  No Normal Range of Motion:  Yes Pulse Foot Exam completed.:  Yes  Right posterior Tibialias:  Present Left posterior Tibialias:  Present  Right Dorsalis Pedis:  Present Left Dorsalis Pedis:  Present  Semmes-Weinstein Monofilament Test R Site 1-Great Toe:  Pos L Site 1-Great Toe:  Pos          Assessment & Plan:      1. Type 2 diabetes mellitus without complication, without long-term current use of insulin (HCC) Hgb A1C 7.7% and urine microalbumin 100 mg/L today. No hypoglycemic episodes. No polyuria, polydipsia or vision changes. Tolerating the Metformin 500 mg BID. No peripheral neuropathy. Encouraged to get eye exam annually and visually inspect feet daily. Normal detailed foot exam today. Recheck of BMP by Dr. Caryl Comes (cardiac electrophysiologist) showed mild hyponatremia with blood sugar 122 on 10-21-18. FBS 130 today. Continue diabetic diet and recheck in 3 months. - POCT UA - Microalbumin - POCT glycosylated hemoglobin (Hb A1C) - metFORMIN (GLUCOPHAGE) 500 MG tablet; Take  1 tablet (500 mg total) by mouth 2 (two) times daily with a meal.  Dispense: 180 tablet; Refill: 3  2. Essential hypertension Well  controlled with HCTZ discontinued and using Spironolactone 50 mg qd, Metoprolol Succinate 35 mg hs, and Chlorthalidone 25 mg qd per cardiologist (Dr. [name] and Caryl Comes).  3. Mixed hyperlipidemia Tolerating the simvastatin 40 mg qd without myalgias or significant arthralgias. Continue low fat diabetic diet and follow up labs next month. Lipid Panel     Component Value Date/Time   CHOL 138 08/25/2018 1053   TRIG 152 (H) 08/25/2018 1053   HDL 46 08/25/2018 1053   CHOLHDL 3.0 08/25/2018 1053   CHOLHDL 2.8 09/30/2017 1003   VLDL 23 11/30/2010 0506   LDLCALC 62 08/25/2018 1053   LDLCALC 63 09/30/2017 1003   4. Gastroesophageal reflux disease with esophagitis Dyspepsia well controlled with use of Omeprazole qd. Recommend he try step down to Omeprazole only once a day then prn use only. Limit caffeine, acidic foods and eating late to prevent recurrences. - omeprazole (PRILOSEC) 40 MG capsule; Take 1 capsule (40 mg total) by mouth daily.  Dispense: 90 capsule; Refill: [place]

## 2018-11-25 NOTE — Telephone Encounter (Signed)
Please review for refill.  

## 2018-11-25 NOTE — Telephone Encounter (Signed)
It looks like this was refilled yesterday.

## 2018-11-25 NOTE — Telephone Encounter (Signed)
Left voicemail message to call back  

## 2018-11-26 ENCOUNTER — Other Ambulatory Visit: Payer: Self-pay

## 2018-11-26 DIAGNOSIS — J330 Polyp of nasal cavity: Secondary | ICD-10-CM | POA: Diagnosis not present

## 2018-11-26 MED ORDER — METOPROLOL SUCCINATE ER 25 MG PO TB24: ORAL_TABLET | ORAL | 3 refills | Status: DC

## 2018-11-26 NOTE — Telephone Encounter (Signed)
Pt wife is returning your call.  

## 2018-11-27 ENCOUNTER — Ambulatory Visit (INDEPENDENT_AMBULATORY_CARE_PROVIDER_SITE_OTHER): Payer: Medicare Other | Admitting: Family Medicine

## 2018-11-27 ENCOUNTER — Encounter: Payer: Self-pay | Admitting: Family Medicine

## 2018-11-27 ENCOUNTER — Telehealth: Payer: Self-pay | Admitting: Cardiovascular Disease

## 2018-11-27 VITALS — BP 129/69 | HR 71 | Temp 97.9°F | Resp 16 | Wt 194.0 lb

## 2018-11-27 DIAGNOSIS — I10 Essential (primary) hypertension: Secondary | ICD-10-CM | POA: Diagnosis not present

## 2018-11-27 DIAGNOSIS — E782 Mixed hyperlipidemia: Secondary | ICD-10-CM

## 2018-11-27 DIAGNOSIS — I25118 Atherosclerotic heart disease of native coronary artery with other forms of angina pectoris: Secondary | ICD-10-CM

## 2018-11-27 DIAGNOSIS — K2100 Gastro-esophageal reflux disease with esophagitis, without bleeding: Secondary | ICD-10-CM

## 2018-11-27 DIAGNOSIS — E119 Type 2 diabetes mellitus without complications: Secondary | ICD-10-CM | POA: Diagnosis not present

## 2018-11-27 LAB — POCT UA - MICROALBUMIN: Microalbumin Ur, POC: 100 mg/L

## 2018-11-27 LAB — POCT GLYCOSYLATED HEMOGLOBIN (HGB A1C)
Est. average glucose Bld gHb Est-mCnc: 174
Hemoglobin A1C: 7.7 % — AB (ref 4.0–5.6)

## 2018-11-27 MED ORDER — OMEPRAZOLE 40 MG PO CPDR: 40.0000 mg | DELAYED_RELEASE_CAPSULE | Freq: Every day | ORAL | 3 refills | Status: DC

## 2018-11-27 MED ORDER — METFORMIN HCL 500 MG PO TABS: 500.0000 mg | ORAL_TABLET | Freq: Two times a day (BID) | ORAL | 3 refills | Status: DC

## 2018-11-27 NOTE — Telephone Encounter (Signed)
Spoke with patients wife per release form and she just wanted to clarify his medications and what he should be taking. Reviewed all medications with her in detail along with what times he should be taking them. She also requested that I send her a mychart message with a list of the cardiac medications along with times to take. She verbalized understanding of our conversation and had no further questions at this time. Will send her mychart message with information requested.

## 2018-11-27 NOTE — Telephone Encounter (Signed)
Patient is not allergic, she states that they just discontinued it and added a different pill. See other telephone note entry about same medication.

## 2018-11-27 NOTE — Telephone Encounter (Signed)
Reference # [id number] Allergy alert pt is allergic to Thiazide. Please call to discuss

## 2018-11-30 ENCOUNTER — Encounter: Payer: Self-pay | Admitting: Physical Therapy

## 2018-11-30 ENCOUNTER — Ambulatory Visit: Payer: Medicare Other | Admitting: Physical Therapy

## 2018-11-30 DIAGNOSIS — R262 Difficulty in walking, not elsewhere classified: Secondary | ICD-10-CM | POA: Diagnosis not present

## 2018-11-30 DIAGNOSIS — M6281 Muscle weakness (generalized): Secondary | ICD-10-CM | POA: Diagnosis not present

## 2018-11-30 NOTE — Therapy (Signed)
[place] [facility] [address] [place], [address] Phone: [phone]   Fax:  [phone]  Physical Therapy Treatment  Patient Details  Name: Howard [name] MRN: [id number] Date of Birth: [birth year] Referring Provider (PT): [name]   Encounter Date: 11/30/2018  PT End of Session - 11/30/18 1445    Visit Number  4    Number of Visits  9    Date for PT Re-Evaluation  12/08/18    PT Start Time  0145    PT Stop Time  0223    PT Time Calculation (min)  38 min    Activity Tolerance  Patient tolerated treatment well    Behavior During Therapy  [facility] for tasks assessed/performed       Past Medical History:  Diagnosis Date  . Arthritis   . Bell palsy   . Cancer [facility])    prostate and skin  . Chronic combined systolic and diastolic CHF, NYHA class 1 ([place])    a. 07/2014 Echo: EF 35-40%, Gr 1 DD.  Marland Kitchen Complete heart block ([place])    a. 11/2010 s/p SJM 2210 Accent DC PPM, ser# [id number].  . Depression   . Diabetes mellitus without complication ([place])   . Fall 11-10-14  . GERD (gastroesophageal reflux disease)   . History of prostate cancer   . Hyperlipidemia   . Hypertension   . LBBB (left bundle branch block)   . Left-sided Bell's palsy   . Lung cancer ([place]) 2016  . NICM (nonischemic cardiomyopathy) ([place])    a. 07/2014 Echo: EF 35-40%, mid-apicalanteroseptal DK, Gr 1 DD, mild-mod dil LA.  . Non-obstructive CAD    a. 07/2014 Abnl MV;  b. 08/2014 Cath: LM nl, LAD 30p, RI 40p, LCX nl, OM1 40, RCA dominant 30p, 70d-->Med Rx.  Marland Kitchen Poor balance   . Presence of permanent cardiac pacemaker   . Sleep apnea    a. cpap  . Vertigo   . WPW (Wolff-Parkinson-White syndrome)    a. S/P RFCA 1991.    Past Surgical History:  Procedure Laterality Date  . APPLICATION OF WOUND VAC Left 06/07/2015   Procedure: APPLICATION OF WOUND VAC;  Surgeon: Robert Bellow, MD;  Location: [facility] ORS;  Service: General;  Laterality: Left;  left upper back   . BACK SURGERY     2011  . CARDIAC CATHETERIZATION  08/26/2014   Single vessel obstructive CAD  . CARPAL TUNNEL RELEASE  04-04-15   [facility]  . CATARACT EXTRACTION  07-31-11 and 09-18-11  . Catheter ablation  1991   for WPW  . cervical fusion    . CHOLECYSTECTOMY  09-07-14  . HAND SURGERY     right 1993; left 2005  . HERNIA REPAIR  1955  . INSERT / REPLACE / REMOVE PACEMAKER    . JOINT REPLACEMENT Left 2013   knee  . JOINT REPLACEMENT Right 2004   knee  . KNEE SURGERY     left knee 1991 and 1992; right knee 1995  . LEFT HEART CATHETERIZATION WITH CORONARY ANGIOGRAM N/A 08/26/2014   Procedure: LEFT HEART CATHETERIZATION WITH CORONARY ANGIOGRAM;  Surgeon: [name], MD;  Location: [facility] CATH LAB;  Service: Cardiovascular;  Laterality: N/A;  . LUMBAR LAMINECTOMY/DECOMPRESSION MICRODISCECTOMY N/A 06/07/2014   Procedure: LUMBAR FOUR TO FIVE LUMBAR LAMINECTOMY/DECOMPRESSION MICRODISCECTOMY 1 LEVEL;  Surgeon: [name], MD;  Location: [place] NEURO ORS;  Service: Neurosurgery;  Laterality: N/A;  . LUNG BIOPSY Right 2016   Dr  [place]     PPM-- St Jude 11/30/10 by [name]  . PROSTATE SURGERY     cancer--1998, prostatectomy  . REPLACEMENT TOTAL KNEE     2004  . ruptured disc     1962 and 1998  . TRIGGER FINGER RELEASE  01-24-15  . WOUND DEBRIDEMENT Left 06/07/2015   Procedure: DEBRIDEMENT WOUND;  Surgeon: Robert Bellow, MD;  Location: [facility] ORS;  Service: General;  Laterality: Left;  left upper back    There were no vitals filed for this visit.  Subjective Assessment - 11/30/18 1350    Subjective  Patient reports he is continuing to ambulate without AD without increased pain. Patient reports he has started looking into gym memberships and thinks he is going to start going to the [place]. Patient reports compliance with HEP and reports that he feels confedient to continue HEP at home independently at this time.     Pertinent History  Patient is a 84 year old male reporting iwth LBP  with LE weakness. Patient has hx of spinal stenosis, and and is currently receiving injections for LE weakness. Patient reports he has some L sided LBP in mornings, but his cheif complaint is his bilat LE numbess and weakness that is occuring after he walks for a period of time. Patient [place] and cannot walk up and down his driveway without the legs feeling like they "are going to give out". Pt denies N/V, B&B changes, unexplained weight fluctuation, saddle paresthesia, fever, night sweats, or unrelenting night pain at this time.    How long can you sit comfortably?  unlimited    How long can you stand comfortably?  29mns    How long can you walk comfortably?  2 min    Patient Stated Goals  Walk for extended period of time, walk up/down the driveway    Pain Onset  More than a month ago       Ther-Ex - Nustep 6 min L4 for safe increased muscle endurance training - 2 MWT without AD - Gait speed test with cane  - Visual/verbal review of HEP which patient verbalizes understanding of and is given increased resistance bands with education on how and when to increase resistance - Educated patient on continued HEP for maintenance as well as general wellness goals for seniors following ACSM protocol:  Aerobic activity: education on "what" constitutes aerobic activity, and frequency/intensity, 3-5x/ week totalling 150 min, moderate intensity (50-70% max amount of work)  Resistance/Strength: education that "strengthening" is band, machine, or weighted/BW exercises. Frequency of 2-3x/week choosing a muscle group to complete 3-4 exercises of 3 sets of 5-10 reps with an intensity that "tires" you by the 5-10 rep range with 60-120sec rest between exercises  Flexibility/Stretching: review of hamstring, glute and childs pose stretch. Educated patient to complete stretching at least 3x/week completing each streatch 2x, holding each stretch for 45-60sec Patient verblaized understanind of all  provided education and was able to categorize therex he has completed in PT and through HEP into one of these categories to ensure understanding. PT answered all questions patient had to clarigy understanding and PT gave a printout of all provided information.                         PT Education - 11/30/18 1443    Education Details  HEP and DC recommendations    Person(s) Educated  Patient    Methods  Explanation;Handout    Comprehension  Verbalized understanding       PT Short Term Goals - 11/30/18 1454      PT SHORT TERM GOAL #1   Title  Pt will be independent with HEP in order to improve strength, flexibility, and balance in order to decrease fall risk and improve function at home and work.    Time  4    Period  Weeks    Status  Achieved        PT Long Term Goals - 11/30/18 1357      PT LONG TERM GOAL #1   Title  Patient will report ambulation over 10 min without LE numbess/weakness to be able to complete ADLs    Baseline  11/30/18 Patient reports he is able to ambulate over 76mns to go complete errands in the community    Time  8    Period  Weeks    Status  Achieved      PT LONG TERM GOAL #2   Title  Pt will decrease 5TSTS by at least 3 seconds in order to demonstrate clinically significant improvement in LE strength    Baseline  10/26/18 12sec    Time  8    Status  Achieved      PT LONG TERM GOAL #3   Title  Patient will be able to ambulate uphill without LE sx in order to retrieve his morning paper    Baseline  11/419: 126fup and back down (approx 20d grade) with patient reporting "no problem" doing this, reports he is able to complete at home, but has put a chair half way incase he needs to rest    Time  8    Period  Weeks    Status  Achieved      PT LONG TERM GOAL #4   Title  Pt will increase 10MWT by at least 0.13 m/s in order to demonstrate clinically significant improvement in community ambulation.     Baseline  11/30/18 1.0831m   Time   8    Period  Weeks    Status  Partially Met      PT LONG TERM GOAL #5   Title  Patient will ambulate [id number] 2 mins to demonstrate age appropriate distance    Baseline  12/20/18 400ft1f2 min without pain    Time  8    Period  Weeks    Status  Partially Met            Plan - 11/30/18 1454    Clinical Impression Statement  Patient has met all goals at this time, and may safely d/c to ind HEP through his gym memmbership. PT spent time ensuring pt understanding of exercise parameters for general health and wellness as well as continued maintainence fo PT gains. Patient verbalized understanding of all provided education and was given clinic contact info should any further questions/concerns arise.     Rehab Potential  Fair    Clinical Impairments Affecting Rehab Potential  (+) social support, motivation (-) age, chronicity of pain, other comorbidities, multiple failed attempts at pain relief (injection, sx, past PT)    PT Frequency  2x / week    PT Duration  8 weeks    PT Treatment/Interventions  ADLs/Self Care Home Management;Cryotherapy;Traction;Gait training;Therapeutic exercise;Patient/family education;Manual techniques;Passive range of motion;Dry needling;Taping;Neuromuscular re-education;Balance training;Stair training;Electrical Stimulation;Ultrasound;Aquatic Therapy;Moist Heat;Iontophoresis 4mg/m22mexamethasone;Therapeutic activities    PT Next Visit Plan  hip/core strengthening, aerobic progression  PT Home Exercise Plan  Mini squat, standing abd, YTB, standing ext YTB, Standing heel raise    Consulted and Agree with Plan of Care  Patient       Patient will benefit from skilled therapeutic intervention in order to improve the following deficits and impairments:  Abnormal gait, Decreased balance, Decreased endurance, Difficulty walking, Decreased range of motion, Improper body mechanics, Impaired tone, Decreased activity tolerance, Decreased strength, Increased fascial  restricitons, Impaired flexibility, Postural dysfunction, Pain  Visit Diagnosis: Difficulty in walking, not elsewhere classified  Muscle weakness (generalized)     Problem List Patient Active Problem List   Diagnosis Date Noted  . Malignant neoplasm of right lung ([place]) 04/18/2016  . CAD (coronary artery disease) 04/01/2016  . Adenocarcinoma ([place]) 04/01/2016  . Skin cyst 12/26/2015  . GERD (gastroesophageal reflux disease) 06/16/2015  . Abscess of back 06/06/2015  . Vertigo 03/27/2015  . Carpal tunnel syndrome 10/28/2014  . Status post cholecystectomy 09/29/2014  . Disease of digestive tract 09/29/2014  . Cardiomyopathy ([place]) 09/26/2014  . Type 2 diabetes mellitus without complications ([place])   . Sleep apnea   . Spinal stenosis, lumbar region, with neurogenic claudication 06/07/2014  . Lumbar stenosis with neurogenic claudication 06/07/2014  . Abnormal gait 08/20/2012  . H/O total knee replacement 08/20/2012  . Arthritis of knee, degenerative 08/20/2012  . Pacemaker-St.Jude 08/03/2012  . Cardiac conduction disorder 06/19/2012  . Acid reflux 06/18/2012  . 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 Triggering of digit 03/25/2012  . Hyperlipidemia 12/23/2011  . Essential hypertension 03/23/2011  . Atrioventricular block, complete ([place]) 03/23/2011  . Complete atrioventricular block ([place]) 03/23/2011   Shelton Silvas PT, DPT Shelton Silvas 11/30/2018, 3:16 PM  [place] [facility] [address], [place], [address] Phone: [phone]   Fax:  [phone]  Name: COLEBY YETT MRN: [id number] Date of Birth: 1931/03/11

## 2018-12-01 ENCOUNTER — Telehealth: Payer: Self-pay

## 2018-12-01 ENCOUNTER — Other Ambulatory Visit: Payer: Self-pay

## 2018-12-01 NOTE — Telephone Encounter (Signed)
*  STAT* If patient is at the pharmacy, call can be transferred to refill team.   1. Which medications need to be refilled? (please list name of each medication and dose if known) Spironolactone  2. Which pharmacy/location (including street and city if local pharmacy) is medication to be sent to?[facility]  3. Do they need a 30 day or 90 day supply? Only needs 10 day supply

## 2018-12-01 NOTE — Telephone Encounter (Signed)
Spoke with patients wife per release form and reviewed her questions. She states that they have not received his spironolactone in the mail. She states that they were instructed to have provider send 15 day script to local pharmacy and to use override in order for them to get that until they receive their shipment. Advised that I would call both pharmacies and get this taken care of for them. She verbalized understanding with no further questions at this time.

## 2018-12-01 NOTE — Telephone Encounter (Signed)
Spoke with [facility] mail order pharmacy and they just wanted to clarify potential sensitivity to clorthalidone but states that patient has been on it. Confirmed prescription changes and also checked on other shipment. Will reach out to [facility] for 15 day supply of other med.

## 2018-12-01 NOTE — Telephone Encounter (Signed)
Please call regarding Chlorthalidone.States pharmacy needs to talk with Dr. [name] regarding [phone]. States pt has been 1 week without medication.  Marland Kitchen

## 2018-12-01 NOTE — Telephone Encounter (Signed)
Spoke with Amy at [facility] and ordered Spironolactone 50 mg one tablet daily with 15 pills and no refills in order to allow time for patient to receive shipment from mail order pharmacy. She ready back patient information and prescription information with no further questions at this time.

## 2018-12-03 ENCOUNTER — Ambulatory Visit: Payer: Medicare Other | Admitting: Physical Therapy

## 2018-12-07 ENCOUNTER — Ambulatory Visit: Payer: Medicare Other | Admitting: Physical Therapy

## 2018-12-09 ENCOUNTER — Ambulatory Visit: Payer: Medicare Other | Admitting: Physical Therapy

## 2018-12-11 LAB — CUP PACEART REMOTE DEVICE CHECK
Battery Remaining Longevity: 10 mo
Battery Remaining Percentage: 10 %
Brady Statistic AP VP Percent: 2.9 %
Brady Statistic AP VS Percent: 1 %
Brady Statistic AS VP Percent: 97 %
Brady Statistic AS VS Percent: 1 %
Brady Statistic RV Percent Paced: 99 %
Implantable Lead Implant Date: 20111208
Implantable Lead Implant Date: 20111208
Implantable Lead Location: 753859
Implantable Lead Location: 753860
Implantable Lead Model: 1948
Implantable Pulse Generator Implant Date: 20111208
Lead Channel Impedance Value: 350 Ohm
Lead Channel Impedance Value: 460 Ohm
Lead Channel Pacing Threshold Amplitude: 0.75 V
Lead Channel Pacing Threshold Pulse Width: 0.4 ms
Lead Channel Pacing Threshold Pulse Width: 0.8 ms
Lead Channel Sensing Intrinsic Amplitude: 12 mV
Lead Channel Sensing Intrinsic Amplitude: 3.7 mV
Lead Channel Setting Pacing Amplitude: 2 V
Lead Channel Setting Pacing Amplitude: 2.5 V
Lead Channel Setting Pacing Pulse Width: 0.8 ms
Lead Channel Setting Sensing Sensitivity: 4 mV
MDC IDC MSMT BATTERY VOLTAGE: 2.71 V
MDC IDC MSMT LEADCHNL RA PACING THRESHOLD AMPLITUDE: 0.75 V
MDC IDC PG SERIAL: 7196739
MDC IDC SESS DTM: 20191009060007
MDC IDC STAT BRADY RA PERCENT PACED: 2.5 %

## 2018-12-17 ENCOUNTER — Encounter: Payer: Medicare Other | Admitting: Physical Therapy

## 2018-12-21 ENCOUNTER — Encounter: Payer: Medicare Other | Admitting: Physical Therapy

## 2018-12-24 ENCOUNTER — Encounter: Payer: Medicare Other | Admitting: Physical Therapy

## 2018-12-25 ENCOUNTER — Telehealth: Payer: Self-pay | Admitting: Family Medicine

## 2018-12-25 ENCOUNTER — Other Ambulatory Visit: Payer: Self-pay | Admitting: Family Medicine

## 2018-12-25 DIAGNOSIS — K2100 Gastro-esophageal reflux disease with esophagitis, without bleeding: Secondary | ICD-10-CM

## 2018-12-25 MED ORDER — OMEPRAZOLE 40 MG PO CPDR: 40.0000 mg | DELAYED_RELEASE_CAPSULE | Freq: Two times a day (BID) | ORAL | 3 refills | Status: DC | PRN

## 2018-12-25 NOTE — Telephone Encounter (Signed)
Done

## 2018-12-25 NOTE — Telephone Encounter (Signed)
Sent refill on 11-27-18 to the [facility] for the Omeprazole 40 mg qd.

## 2018-12-25 NOTE — Telephone Encounter (Signed)
Pt's wife calling regarding pt's Rx - omeprazole (PRILOSEC) 40 MG capsule. He went back taking the 40 MG because the half wasn't helping his symptoms.  He is now out of the Rx needing a refill.  Please advise.  Thanks, [employer]

## 2018-12-25 NOTE — Telephone Encounter (Signed)
Patient's wife [name] stated patient has been taking omeprazole 40 mg twice a day. Patient tried taking omeprazole 40 mg qd but he immediately started having problems, so patient started taking it bid again The pharmacy will not give him a new refill unless the directions have been changed to say bid. Please advise?

## 2018-12-25 NOTE — Telephone Encounter (Signed)
Please advise 

## 2018-12-28 ENCOUNTER — Encounter: Payer: Medicare Other | Admitting: Physical Therapy

## 2018-12-30 ENCOUNTER — Ambulatory Visit (INDEPENDENT_AMBULATORY_CARE_PROVIDER_SITE_OTHER): Payer: Medicare Other

## 2018-12-30 ENCOUNTER — Ambulatory Visit
Admission: RE | Admit: 2018-12-30 | Discharge: 2018-12-30 | Disposition: A | Discharge status: Home / Self Care | Payer: Medicare Other | Source: Ambulatory Visit | Attending: Anesthesiology | Admitting: Anesthesiology

## 2018-12-30 ENCOUNTER — Ambulatory Visit (HOSPITAL_BASED_OUTPATIENT_CLINIC_OR_DEPARTMENT_OTHER): Payer: Medicare Other | Admitting: Anesthesiology

## 2018-12-30 ENCOUNTER — Encounter: Payer: Self-pay | Admitting: Anesthesiology

## 2018-12-30 ENCOUNTER — Other Ambulatory Visit: Payer: Self-pay

## 2018-12-30 ENCOUNTER — Other Ambulatory Visit: Payer: Self-pay | Admitting: Anesthesiology

## 2018-12-30 VITALS — BP 132/61 | HR 80 | Temp 98.1°F | Resp 16 | Ht 69.0 in | Wt 194.0 lb

## 2018-12-30 DIAGNOSIS — M5432 Sciatica, left side: Secondary | ICD-10-CM

## 2018-12-30 DIAGNOSIS — M961 Postlaminectomy syndrome, not elsewhere classified: Secondary | ICD-10-CM

## 2018-12-30 DIAGNOSIS — M545 Low back pain, unspecified: Secondary | ICD-10-CM

## 2018-12-30 DIAGNOSIS — G8929 Other chronic pain: Secondary | ICD-10-CM | POA: Insufficient documentation

## 2018-12-30 DIAGNOSIS — M5136 Other intervertebral disc degeneration, lumbar region: Secondary | ICD-10-CM | POA: Diagnosis not present

## 2018-12-30 DIAGNOSIS — M48062 Spinal stenosis, lumbar region with neurogenic claudication: Secondary | ICD-10-CM | POA: Diagnosis not present

## 2018-12-30 DIAGNOSIS — I442 Atrioventricular block, complete: Secondary | ICD-10-CM

## 2018-12-30 DIAGNOSIS — R52 Pain, unspecified: Secondary | ICD-10-CM

## 2018-12-30 DIAGNOSIS — I428 Other cardiomyopathies: Secondary | ICD-10-CM | POA: Diagnosis not present

## 2018-12-30 MED ORDER — SODIUM CHLORIDE (PF) 0.9 % IJ SOLN
INTRAMUSCULAR | Status: AC
  Filled 2018-12-30: qty 10

## 2018-12-30 MED ORDER — ROPIVACAINE HCL 2 MG/ML IJ SOLN: 10.0000 mL | Freq: Once | INTRAMUSCULAR | Status: DC

## 2018-12-30 MED ORDER — TRIAMCINOLONE ACETONIDE 40 MG/ML IJ SUSP
INTRAMUSCULAR | Status: AC
  Filled 2018-12-30: qty 1

## 2018-12-30 MED ORDER — LIDOCAINE HCL (PF) 1 % IJ SOLN
5.0000 mL | Freq: Once | INTRAMUSCULAR | Status: AC
  Administered 2018-12-30: 5 mL via SUBCUTANEOUS
  Filled 2018-12-30: qty 5

## 2018-12-30 MED ORDER — IOPAMIDOL (ISOVUE-M 200) INJECTION 41%
INTRAMUSCULAR | Status: AC
  Filled 2018-12-30: qty 10

## 2018-12-30 MED ORDER — TRIAMCINOLONE ACETONIDE 40 MG/ML IJ SUSP
40.0000 mg | Freq: Once | INTRAMUSCULAR | Status: AC
  Administered 2018-12-30: 40 mg

## 2018-12-30 MED ORDER — ROPIVACAINE HCL 2 MG/ML IJ SOLN
INTRAMUSCULAR | Status: AC
  Filled 2018-12-30: qty 10

## 2018-12-30 MED ORDER — IOPAMIDOL (ISOVUE-M 200) INJECTION 41%
20.0000 mL | Freq: Once | INTRAMUSCULAR | Status: DC | PRN
  Administered 2018-12-30: 10 mL
  Filled 2018-12-30: qty 20

## 2018-12-30 NOTE — Progress Notes (Signed)
Safety precautions to be maintained throughout the outpatient stay will include: orient to surroundings, keep bed in low position, maintain call bell within reach at all times, provide assistance with transfer out of bed and ambulation.  

## 2018-12-30 NOTE — Patient Instructions (Signed)

## 2018-12-31 ENCOUNTER — Encounter: Payer: Medicare Other | Admitting: Physical Therapy

## 2018-12-31 ENCOUNTER — Telehealth: Payer: Self-pay | Admitting: *Deleted

## 2018-12-31 LAB — CUP PACEART REMOTE DEVICE CHECK
Battery Remaining Longevity: 6 mo
Battery Remaining Percentage: 6 %
Battery Voltage: 2.68 V
Brady Statistic AP VP Percent: 4 %
Brady Statistic AP VS Percent: 1 %
Brady Statistic AS VP Percent: 96 %
Brady Statistic AS VS Percent: 1 %
Brady Statistic RA Percent Paced: 4 %
Brady Statistic RV Percent Paced: 99 %
Date Time Interrogation Session: 20200108074016
Implantable Lead Implant Date: 20111208
Implantable Lead Implant Date: 20111208
Implantable Lead Location: 753859
Implantable Lead Location: 753860
Implantable Lead Model: 1948
Implantable Pulse Generator Implant Date: 20111208
Lead Channel Impedance Value: 380 Ohm
Lead Channel Impedance Value: 430 Ohm
Lead Channel Pacing Threshold Amplitude: 0.75 V
Lead Channel Pacing Threshold Amplitude: 1.25 V
Lead Channel Pacing Threshold Pulse Width: 0.4 ms
Lead Channel Pacing Threshold Pulse Width: 0.8 ms
Lead Channel Sensing Intrinsic Amplitude: 12 mV
Lead Channel Sensing Intrinsic Amplitude: 3 mV
Lead Channel Setting Pacing Amplitude: 2 V
Lead Channel Setting Pacing Amplitude: 2.5 V
Lead Channel Setting Pacing Pulse Width: 0.8 ms
Lead Channel Setting Sensing Sensitivity: 4 mV
Pulse Gen Model: 2210
Pulse Gen Serial Number: 7196739

## 2018-12-31 NOTE — Progress Notes (Signed)
Remote pacemaker transmission.   

## 2018-12-31 NOTE — Telephone Encounter (Signed)
No problems post procedure. 

## 2019-01-01 ENCOUNTER — Telehealth: Payer: Self-pay | Admitting: Internal Medicine

## 2019-01-01 NOTE — Progress Notes (Signed)
Subjective:  Patient ID: Howard [name], male    DOB: 11/13/1931  Age: 85 y.o. MRN: [id number]  CC: No chief complaint on file.   Procedure: L2-3 epidural steroid under fluoroscopic guidance with no sedation  HPI Howard [name] presents for reevaluation.  He was last seen in November with previous epidurals in July and November.  His last epidural in November worked very effectively for him.  He states that he was able to sleep better ambulate better walk for longer periods of time and had less overall pain.  He has had recurrence of the same quality lower extremity pain and low back pain as previously reported and desires to proceed with a repeat injection today.  He has not required any opioid management and is trying to do some stretching exercises with limited success based on his mobility.  Otherwise no change in bowel or bladder function is noted.  Outpatient Medications Prior to Visit  Medication Sig Dispense Refill  . albuterol (PROVENTIL HFA;VENTOLIN HFA) 108 (90 Base) MCG/ACT inhaler Inhale 1-2 puffs into the lungs every 6 (six) hours as needed for wheezing or shortness of breath. 1 Inhaler 0  . aspirin 81 MG tablet Take 81 mg by mouth daily.      . chlorthalidone (HYGROTON) 25 MG tablet Take 1 tablet (25 mg total) by mouth daily. 90 tablet 3  . Cholecalciferol (VITAMIN D3) 1000 UNITS CAPS Take 1,000 Units by mouth daily.     Marland Kitchen ENTRESTO 97-103 MG TAKE 1 TABLET TWICE A DAY 180 tablet 0  . fluticasone (FLONASE) 50 MCG/ACT nasal spray Place 2 sprays into both nostrils daily.    . isosorbide mononitrate (IMDUR) 30 MG 24 hr tablet Take 1 tablet (30 mg total) by mouth daily. 90 tablet 3  . metFORMIN (GLUCOPHAGE) 500 MG tablet Take 1 tablet (500 mg total) by mouth 2 (two) times daily with a meal. 180 tablet 3  . metoprolol succinate (TOPROL-XL) 25 MG 24 hr tablet Take 1 tablet (25 mg) by mouth once daily at bedtime 90 tablet 3  . montelukast (SINGULAIR) 10 MG tablet Take 10 mg by mouth at  bedtime.    . Multiple Vitamin (MULTIVITAMIN) capsule Take 1 capsule by mouth daily.      Marland Kitchen omeprazole (PRILOSEC) 40 MG capsule Take 1 capsule (40 mg total) by mouth 2 (two) times daily as needed. 180 capsule 3  . psyllium (METAMUCIL) 58.6 % powder Take 1 packet by mouth 3 (three) times daily.    . simvastatin (ZOCOR) 40 MG tablet TAKE 1 TABLET AT BEDTIME 90 tablet 3  . spironolactone (ALDACTONE) 50 MG tablet Take 1 tablet (50 mg) by mouth once daily in the morning 90 tablet 3   No facility-administered medications prior to visit.     Review of Systems CNS: No confusion or sedation Cardiac: No angina or palpitations GI: No abdominal pain or constipation Constitutional: No nausea vomiting fevers or chills  Objective:  BP 132/61   Pulse 80   Temp 98.1 F (36.7 C) (Oral)   Resp 16   Ht 5\' 9"  (1.753 m)   Wt 194 lb (88 kg)   SpO2 97%   BMI 28.65 kg/m    BP Readings from Last 3 Encounters:  12/30/18 132/61  11/27/18 129/69  11/04/18 (!) 148/70     Wt Readings from Last 3 Encounters:  12/30/18 194 lb (88 kg)  11/27/18 194 lb (88 kg)  11/04/18 204 lb 9.6 oz (92.8 kg)  Physical Exam Pt is alert and oriented PERRL EOMI HEART IS RRR no murmur or rub LCTA no wheezing or rales MUSCULOSKELETAL reveals some paraspinous muscle tenderness but no overt trigger points.  Labs  Lab Results  Component Value Date   HGBA1C 7.7 (A) 11/27/2018   HGBA1C 7.6 (A) 08/25/2018   HGBA1C 7.6 (H) 05/05/2018   Lab Results  Component Value Date   MICROALBUR 100 11/27/2018   LDLCALC 62 08/25/2018   CREATININE 0.82 10/20/2018    -------------------------------------------------------------------------------------------------------------------- Lab Results  Component Value Date   WBC 5.8 08/25/2018   HGB 14.5 08/25/2018   HCT 45.6 08/25/2018   PLT 217 08/25/2018   GLUCOSE 122 (H) 10/20/2018   CHOL 138 08/25/2018   TRIG 152 (H) 08/25/2018   HDL 46 08/25/2018   LDLCALC 62  08/25/2018   ALT 19 08/25/2018   AST 21 08/25/2018   NA 128 (L) 10/20/2018   K 4.7 10/20/2018   CL 90 (L) 10/20/2018   CREATININE 0.82 10/20/2018   BUN 15 10/20/2018   CO2 20 10/20/2018   TSH 1.650 05/05/2018   INR 0.94 10/26/2015   HGBA1C 7.7 (A) 11/27/2018   MICROALBUR 100 11/27/2018    --------------------------------------------------------------------------------------------------------------------- Dg C-arm 1-60 Min-no Report  Result Date: 12/30/2018 Fluoroscopy was utilized by the requesting physician.  No radiographic interpretation.     Assessment & Plan:   Diagnoses and all orders for this visit:  Lumbar post-laminectomy syndrome  DDD (degenerative disc disease), lumbar  Sciatica of left side  Chronic left-sided low back pain without sciatica  Spinal stenosis, lumbar region, with neurogenic claudication  Other orders -     triamcinolone acetonide (KENALOG-40) injection 40 mg -     ropivacaine (PF) 2 mg/mL (0.2%) (NAROPIN) injection 10 mL -     lidocaine (PF) (XYLOCAINE) 1 % injection 5 mL -     iopamidol (ISOVUE-M) 41 % intrathecal injection 20 mL        ----------------------------------------------------------------------------------------------------------------------  Problem List Items Addressed This Visit      Unprioritized   Spinal stenosis, lumbar region, with neurogenic claudication   Relevant Medications   triamcinolone acetonide (KENALOG-40) injection 40 mg (Completed)    Other Visit Diagnoses    Lumbar post-laminectomy syndrome    -  Primary   DDD (degenerative disc disease), lumbar       Relevant Medications   triamcinolone acetonide (KENALOG-40) injection 40 mg (Completed)   Sciatica of left side       Chronic left-sided low back pain without sciatica       Relevant Medications   triamcinolone acetonide (KENALOG-40) injection 40 mg (Completed)         ----------------------------------------------------------------------------------------------------------------------  1. Lumbar post-laminectomy syndrome We will proceed with a repeat L2-3 epidural steroid as this was effective for him last time.  We have gone over the risks and benefits of the procedure with him in full detail and questions are answered.  We will schedule him for return to clinic in 3 months for possible repeat injection at that time.  2. DDD (degenerative disc disease), lumbar As above and continue core stretching and strengthening  3. Sciatica of left side As above  4. Chronic left-sided low back pain without sciatica As above  5. Spinal stenosis, lumbar region, with neurogenic claudication As above    ----------------------------------------------------------------------------------------------------------------------  I am having Howard [name] "Howard [name]" maintain his multivitamin, Vitamin D3, aspirin, psyllium, albuterol, simvastatin, fluticasone, montelukast, isosorbide mononitrate, ENTRESTO, spironolactone, chlorthalidone, metoprolol succinate, metFORMIN, and omeprazole. We  administered triamcinolone acetonide, lidocaine (PF), and iopamidol.   Meds ordered this encounter  Medications  . triamcinolone acetonide (KENALOG-40) injection 40 mg  . ropivacaine (PF) 2 mg/mL (0.2%) (NAROPIN) injection 10 mL  . lidocaine (PF) (XYLOCAINE) 1 % injection 5 mL  . iopamidol (ISOVUE-M) 41 % intrathecal injection 20 mL   Patient's Medications  New Prescriptions   No medications on file  Previous Medications   ALBUTEROL (PROVENTIL HFA;VENTOLIN HFA) 108 (90 BASE) MCG/ACT INHALER    Inhale 1-2 puffs into the lungs every 6 (six) hours as needed for wheezing or shortness of breath.   ASPIRIN 81 MG TABLET    Take 81 mg by mouth daily.     CHLORTHALIDONE (HYGROTON) 25 MG TABLET    Take 1 tablet (25 mg total) by mouth daily.   CHOLECALCIFEROL (VITAMIN D3) 1000  UNITS CAPS    Take 1,000 Units by mouth daily.    ENTRESTO 97-103 MG    TAKE 1 TABLET TWICE A DAY   FLUTICASONE (FLONASE) 50 MCG/ACT NASAL SPRAY    Place 2 sprays into both nostrils daily.   ISOSORBIDE MONONITRATE (IMDUR) 30 MG 24 HR TABLET    Take 1 tablet (30 mg total) by mouth daily.   METFORMIN (GLUCOPHAGE) 500 MG TABLET    Take 1 tablet (500 mg total) by mouth 2 (two) times daily with a meal.   METOPROLOL SUCCINATE (TOPROL-XL) 25 MG 24 HR TABLET    Take 1 tablet (25 mg) by mouth once daily at bedtime   MONTELUKAST (SINGULAIR) 10 MG TABLET    Take 10 mg by mouth at bedtime.   MULTIPLE VITAMIN (MULTIVITAMIN) CAPSULE    Take 1 capsule by mouth daily.     OMEPRAZOLE (PRILOSEC) 40 MG CAPSULE    Take 1 capsule (40 mg total) by mouth 2 (two) times daily as needed.   PSYLLIUM (METAMUCIL) 58.6 % POWDER    Take 1 packet by mouth 3 (three) times daily.   SIMVASTATIN (ZOCOR) 40 MG TABLET    TAKE 1 TABLET AT BEDTIME   SPIRONOLACTONE (ALDACTONE) 50 MG TABLET    Take 1 tablet (50 mg) by mouth once daily in the morning  Modified Medications   No medications on file  Discontinued Medications   No medications on file   ----------------------------------------------------------------------------------------------------------------------  Follow-up: Return in about 3 months (around 03/31/2019) for evaluation, procedure.   Procedure: L2-3 LESI with fluoroscopic guidance and no moderate sedation  NOTE: The risks, benefits, and expectations of the procedure have been discussed and explained to the patient who was understanding and in agreement with suggested treatment plan. No guarantees were made.  DESCRIPTION OF PROCEDURE: Lumbar epidural steroid injection with no IV Versed, EKG, blood pressure, pulse, and pulse oximetry monitoring. The procedure was performed with the patient in the prone position under fluoroscopic guidance.  Sterile prep x3 was initiated and I then injected subcutaneous lidocaine to the  overlying to 3 site after its fluoroscopic identifictation.  Using strict aseptic technique, I then advanced an 18-gauge Tuohy epidural needle in the midline using interlaminar approach via loss-of-resistance to saline technique. There was negative aspiration for heme or  CSF.  I then confirmed position with both AP and Lateral fluoroscan.  2 cc of Isovue were injected and a  total of 5 mL of Preservative-Free normal saline mixed with 40 mg of Kenalog and 1cc Ropicaine 0.2 percent were injected incrementally via the  epidurally placed needle. The needle was removed. The patient tolerated the injection well  and was convalesced and discharged to home in stable condition. Should the patient have any post procedure difficulty they have been instructed on how to contact us for assistance.    Molli Barrows, MD

## 2019-01-01 NOTE — Telephone Encounter (Signed)
I explained to the patient wife that we indeed receive the remote transmission. I told her that his  monitor is updating properly. I also explained to her that the blinking was because the monitor was doing a software update. I gave her the number to [place] support because she wanted to know how long will it take.

## 2019-01-01 NOTE — Telephone Encounter (Signed)
New Message    Patient states the box that's plugged into the monitor is blinking and has been blinking since he was suppose to have the home remote check.  Patient wants to know if it's suppose to be blinking or is it an issue with the machine.

## 2019-01-08 DIAGNOSIS — H903 Sensorineural hearing loss, bilateral: Secondary | ICD-10-CM | POA: Diagnosis not present

## 2019-01-08 DIAGNOSIS — J330 Polyp of nasal cavity: Secondary | ICD-10-CM | POA: Diagnosis not present

## 2019-01-22 ENCOUNTER — Ambulatory Visit: Payer: Self-pay | Admitting: Family Medicine

## 2019-01-25 ENCOUNTER — Encounter: Payer: Self-pay | Admitting: Anesthesiology

## 2019-01-25 ENCOUNTER — Ambulatory Visit: Payer: Medicare Other | Attending: Anesthesiology | Admitting: Anesthesiology

## 2019-01-25 VITALS — BP 109/85 | HR 73 | Temp 98.3°F | Resp 16 | Ht 69.5 in | Wt 194.0 lb

## 2019-01-25 DIAGNOSIS — M48062 Spinal stenosis, lumbar region with neurogenic claudication: Secondary | ICD-10-CM | POA: Diagnosis not present

## 2019-01-25 DIAGNOSIS — M5136 Other intervertebral disc degeneration, lumbar region: Secondary | ICD-10-CM | POA: Diagnosis not present

## 2019-01-25 DIAGNOSIS — G8929 Other chronic pain: Secondary | ICD-10-CM | POA: Insufficient documentation

## 2019-01-25 DIAGNOSIS — M5432 Sciatica, left side: Secondary | ICD-10-CM

## 2019-01-25 DIAGNOSIS — M545 Low back pain: Secondary | ICD-10-CM | POA: Insufficient documentation

## 2019-01-25 DIAGNOSIS — R29898 Other symptoms and signs involving the musculoskeletal system: Secondary | ICD-10-CM | POA: Diagnosis not present

## 2019-01-25 DIAGNOSIS — M961 Postlaminectomy syndrome, not elsewhere classified: Secondary | ICD-10-CM | POA: Insufficient documentation

## 2019-01-25 NOTE — Patient Instructions (Signed)
____________________________________________________________________________________________  Preparing for your procedure (without sedation)  Instructions: . Oral Intake: Do not eat or drink anything for at least 3 hours prior to your procedure. . Transportation: Unless otherwise stated by your physician, you may drive yourself after the procedure. . Blood Pressure Medicine: Take your blood pressure medicine with a sip of water the morning of the procedure. . Blood thinners: Notify our staff if you are taking any blood thinners. Depending on which one you take, there will be specific instructions on how and when to stop it. . Diabetics on insulin: Notify the staff so that you can be scheduled 1st case in the morning. If your diabetes requires high dose insulin, take only  of your normal insulin dose the morning of the procedure and notify the staff that you have done so. . Preventing infections: Shower with an antibacterial soap the morning of your procedure.  . Build-up your immune system: Take 1000 mg of Vitamin C with every meal (3 times a day) the day prior to your procedure. Marland Kitchen Antibiotics: Inform the staff if you have a condition or reason that requires you to take antibiotics before dental procedures. . Pregnancy: If you are pregnant, call and cancel the procedure. . Sickness: If you have a cold, fever, or any active infections, call and cancel the procedure. . Arrival: You must be in the facility at least 30 minutes prior to your scheduled procedure. . Children: Do not bring any children with you. . Dress appropriately: Bring dark clothing that you would not mind if they get stained. . Valuables: Do not bring any jewelry or valuables.  Procedure appointments are reserved for interventional treatments only. Marland Kitchen No Prescription Refills. . No medication changes will be discussed during procedure appointments. . No disability issues will be discussed.  Reasons to call and reschedule or  cancel your procedure: (Following these recommendations will minimize the risk of a serious complication.) . Surgeries: Avoid having procedures within 2 weeks of any surgery. (Avoid for 2 weeks before or after any surgery). . Flu Shots: Avoid having procedures within 2 weeks of a flu shots or . (Avoid for 2 weeks before or after immunizations). . Barium: Avoid having a procedure within 7-10 days after having had a radiological study involving the use of radiological contrast. (Myelograms, Barium swallow or enema study). . Heart attacks: Avoid any elective procedures or surgeries for the initial 6 months after a "Myocardial Infarction" (Heart Attack). . Blood thinners: It is imperative that you stop these medications before procedures. Let us know if you if you take any blood thinner.  . Infection: Avoid procedures during or within two weeks of an infection (including chest colds or gastrointestinal problems). Symptoms associated with infections include: Localized redness, fever, chills, night sweats or profuse sweating, burning sensation when voiding, cough, congestion, stuffiness, runny nose, sore throat, diarrhea, nausea, vomiting, cold or Flu symptoms, recent or current infections. It is specially important if the infection is over the area that we intend to treat. Marland Kitchen Heart and lung problems: Symptoms that may suggest an active cardiopulmonary problem include: cough, chest pain, breathing difficulties or shortness of breath, dizziness, ankle swelling, uncontrolled high or unusually low blood pressure, and/or palpitations. If you are experiencing any of these symptoms, cancel your procedure and contact your primary care physician for an evaluation.  Remember:  Regular Business hours are:  Monday to Thursday 8:00 AM to 4:00 PM  Provider's Schedule: Milinda Pointer, MD:  Procedure days: Tuesday and Thursday 7:30 AM to  4:00 PM  Gillis Santa, MD:  Procedure days: Monday and Wednesday 7:30 AM to 4:00  PM ____________________________________________________________________________________________   Epidural Steroid Injection Patient Information  Description: The epidural space surrounds the nerves as they exit the spinal cord.  In some patients, the nerves can be compressed and inflamed by a bulging disc or a tight spinal canal (spinal stenosis).  By injecting steroids into the epidural space, we can bring irritated nerves into direct contact with a potentially helpful medication.  These steroids act directly on the irritated nerves and can reduce swelling and inflammation which often leads to decreased pain.  Epidural steroids may be injected anywhere along the spine and from the neck to the low back depending upon the location of your pain.   After numbing the skin with local anesthetic (like Novocaine), a small needle is passed into the epidural space slowly.  You may experience a sensation of pressure while this is being done.  The entire block usually last less than 10 minutes.  Conditions which may be treated by epidural steroids:   Low back and leg pain  Neck and arm pain  Spinal stenosis  Post-laminectomy syndrome  Herpes zoster (shingles) pain  Pain from compression fractures  Preparation for the injection:  1. Do not eat any solid food or dairy products within 8 hours of your appointment.  2. You may drink clear liquids up to 3 hours before appointment.  Clear liquids include water, black coffee, juice or soda.  No milk or cream please. 3. You may take your regular medication, including pain medications, with a sip of water before your appointment  Diabetics should hold regular insulin (if taken separately) and take 1/2 normal NPH dos the morning of the procedure.  Carry some sugar containing items with you to your appointment. 4. A driver must accompany you and be prepared to drive you home after your procedure.  5. Bring all your current medications with your. 6. An IV  may be inserted and sedation may be given at the discretion of the physician.   7. A blood pressure cuff, EKG and other monitors will often be applied during the procedure.  Some patients may need to have extra oxygen administered for a short period. 8. You will be asked to provide medical information, including your allergies, prior to the procedure.  We must know immediately if you are taking blood thinners (like Coumadin/Warfarin)  Or if you are allergic to IV iodine contrast (dye). We must know if you could possible be pregnant.  Possible side-effects:  Bleeding from needle site  Infection (rare, may require surgery)  Nerve injury (rare)  Numbness & tingling (temporary)  Difficulty urinating (rare, temporary)  Spinal headache ( a headache worse with upright posture)  Light -headedness (temporary)  Pain at injection site (several days)  Decreased blood pressure (temporary)  Weakness in arm/leg (temporary)  Pressure sensation in back/neck (temporary)  Call if you experience:  Fever/chills associated with headache or increased back/neck pain.  Headache worsened by an upright position.  New onset weakness or numbness of an extremity below the injection site  Hives or difficulty breathing (go to the emergency room)  Inflammation or drainage at the infection site  Severe back/neck pain  Any new symptoms which are concerning to you  Please note:  Although the local anesthetic injected can often make your back or neck feel good for several hours after the injection, the pain will likely return.  It takes 3-7 days for steroids  to work in the epidural space.  You may not notice any pain relief for at least that one week.  If effective, we will often do a series of three injections spaced 3-6 weeks apart to maximally decrease your pain.  After the initial series, we generally will wait several months before considering a repeat injection of the same type.  If you have any  questions, please call [phone] [facility]

## 2019-01-25 NOTE — Progress Notes (Signed)
Safety precautions to be maintained throughout the outpatient stay will include: orient to surroundings, keep bed in low position, maintain call bell within reach at all times, provide assistance with transfer out of bed and ambulation.  

## 2019-01-27 ENCOUNTER — Encounter: Payer: Self-pay | Admitting: Anesthesiology

## 2019-01-27 NOTE — Progress Notes (Signed)
Subjective:  Patient ID: Howard [name], male    DOB: 1931-09-11  Age: 85 y.o. MRN: [id number]  CC: Leg Pain (left )   Procedure: None  HPI Howard [name] presents for several weeks ago and had an epidural most recently but has failed to gain any significant relief regarding his left anterior thigh pain.  He has had some give way weakness with this as well and has been utilizing a cane recently.  Otherwise the quality characteristic distribution of the pain to been stable in nature with no significant changes noted today.  He has previously had multiple back surgeries and has been seen by neurosurgery and found to be a nonsurgical candidate.  Bowel and bladder function of been stable in nature and the degree of weakness in the left anterior thigh has been stable.  Outpatient Medications Prior to Visit  Medication Sig Dispense Refill  . albuterol (PROVENTIL HFA;VENTOLIN HFA) 108 (90 Base) MCG/ACT inhaler Inhale 1-2 puffs into the lungs every 6 (six) hours as needed for wheezing or shortness of breath. 1 Inhaler 0  . aspirin 81 MG tablet Take 81 mg by mouth daily.      . chlorthalidone (HYGROTON) 25 MG tablet Take 1 tablet (25 mg total) by mouth daily. 90 tablet 3  . Cholecalciferol (VITAMIN D3) 1000 UNITS CAPS Take 1,000 Units by mouth daily.     Marland Kitchen ENTRESTO 97-103 MG TAKE 1 TABLET TWICE A DAY 180 tablet 0  . fluticasone (FLONASE) 50 MCG/ACT nasal spray Place 2 sprays into both nostrils daily.    . isosorbide mononitrate (IMDUR) 30 MG 24 hr tablet Take 1 tablet (30 mg total) by mouth daily. 90 tablet 3  . metFORMIN (GLUCOPHAGE) 500 MG tablet Take 1 tablet (500 mg total) by mouth 2 (two) times daily with a meal. 180 tablet 3  . metoprolol succinate (TOPROL-XL) 25 MG 24 hr tablet Take 1 tablet (25 mg) by mouth once daily at bedtime 90 tablet 3  . montelukast (SINGULAIR) 10 MG tablet Take 10 mg by mouth at bedtime.    . Multiple Vitamin (MULTIVITAMIN) capsule Take 1 capsule by mouth daily.      Marland Kitchen  omeprazole (PRILOSEC) 40 MG capsule Take 1 capsule (40 mg total) by mouth 2 (two) times daily as needed. 180 capsule 3  . psyllium (METAMUCIL) 58.6 % powder Take 1 packet by mouth 3 (three) times daily.    . simvastatin (ZOCOR) 40 MG tablet TAKE 1 TABLET AT BEDTIME 90 tablet 3  . spironolactone (ALDACTONE) 50 MG tablet Take 1 tablet (50 mg) by mouth once daily in the morning 90 tablet 3   No facility-administered medications prior to visit.     Review of Systems CNS: No confusion or sedation Cardiac: No angina or palpitations GI: No abdominal pain or constipation Constitutional: No nausea vomiting fevers or chills  Objective:  BP 109/85 (BP Location: Left Arm, Patient Position: Sitting, Cuff Size: Normal)   Pulse 73   Temp 98.3 F (36.8 C) (Oral)   Resp 16   Ht 5' 9.5" (1.765 m)   Wt 194 lb (88 kg)   SpO2 100%   BMI 28.24 kg/m    BP Readings from Last 3 Encounters:  01/25/19 109/85  12/30/18 132/61  11/27/18 129/69     Wt Readings from Last 3 Encounters:  01/25/19 194 lb (88 kg)  12/30/18 194 lb (88 kg)  11/27/18 194 lb (88 kg)     Physical Exam Pt is alert and  oriented PERRL EOMI HEART IS RRR no murmur or rub LCTA no wheezing or rales MUSCULOSKELETAL reveals a very antalgic gait and use of a cane for assistance with ambulation.  Labs  Lab Results  Component Value Date   HGBA1C 7.7 (A) 11/27/2018   HGBA1C 7.6 (A) 08/25/2018   HGBA1C 7.6 (H) 05/05/2018   Lab Results  Component Value Date   MICROALBUR 100 11/27/2018   LDLCALC 62 08/25/2018   CREATININE 0.82 10/20/2018    -------------------------------------------------------------------------------------------------------------------- Lab Results  Component Value Date   WBC 5.8 08/25/2018   HGB 14.5 08/25/2018   HCT 45.6 08/25/2018   PLT 217 08/25/2018   GLUCOSE 122 (H) 10/20/2018   CHOL 138 08/25/2018   TRIG 152 (H) 08/25/2018   HDL 46 08/25/2018   LDLCALC 62 08/25/2018   ALT 19 08/25/2018    AST 21 08/25/2018   NA 128 (L) 10/20/2018   K 4.7 10/20/2018   CL 90 (L) 10/20/2018   CREATININE 0.82 10/20/2018   BUN 15 10/20/2018   CO2 20 10/20/2018   TSH 1.650 05/05/2018   INR 0.94 10/26/2015   HGBA1C 7.7 (A) 11/27/2018   MICROALBUR 100 11/27/2018    --------------------------------------------------------------------------------------------------------------------- Dg C-arm 1-60 Min-no Report  Result Date: 12/30/2018 Fluoroscopy was utilized by the requesting physician.  No radiographic interpretation.     Assessment & Plan:   Howard [name] was seen today for leg pain.  Diagnoses and all orders for this visit:  Lumbar post-laminectomy syndrome -     Ambulatory referral to Physical Therapy -     Lumbar Epidural Injection; Future  DDD (degenerative disc disease), lumbar  Sciatica of left side  Chronic left-sided low back pain without sciatica -     Ambulatory referral to Physical Therapy -     Ambulatory referral to Occupational Therapy  Spinal stenosis, lumbar region, with neurogenic claudication -     Ambulatory referral to Occupational Therapy -     Lumbar Epidural Injection; Future  Weakness of left leg -     Ambulatory referral to Occupational Therapy        ----------------------------------------------------------------------------------------------------------------------  Problem List Items Addressed This Visit      Unprioritized   Spinal stenosis, lumbar region, with neurogenic claudication   Relevant Orders   Ambulatory referral to Occupational Therapy   Lumbar Epidural Injection    Other Visit Diagnoses    Lumbar post-laminectomy syndrome    -  Primary   Relevant Orders   Ambulatory referral to Physical Therapy   Lumbar Epidural Injection   DDD (degenerative disc disease), lumbar       Sciatica of left side       Chronic left-sided low back pain without sciatica       Relevant Orders   Ambulatory referral to Physical Therapy   Ambulatory  referral to Occupational Therapy   Weakness of left leg       Relevant Orders   Ambulatory referral to Occupational Therapy        ----------------------------------------------------------------------------------------------------------------------  1. Lumbar post-laminectomy syndrome He seems to have done well with the physical therapy and this kept his pain under good control however this has recurred so I am sending him back for repeat evaluation and treatment secondary to the persistent nature of the low back pain.  He also may be a candidate for TENS application or iontophoresis. - Ambulatory referral to Physical Therapy - Lumbar Epidural Injection; Future  2. DDD (degenerative disc disease), lumbar Following this we will schedule him for  a caudal epidural block to see if this may be more effective for him and this was reviewed in detail today.  3. Sciatica of left side Continue efforts at stretching and strengthening as tolerated  4. Chronic left-sided low back pain without sciatica He is a fall precaution and we talked about a referral to occupational therapy to assist with this. - Ambulatory referral to Physical Therapy - Ambulatory referral to Occupational Therapy  5. Spinal stenosis, lumbar region, with neurogenic claudication Follow-up in 1 month for reevaluation. - Ambulatory referral to Occupational Therapy - Lumbar Epidural Injection; Future  6. Weakness of left leg As above - Ambulatory referral to Occupational Therapy    ----------------------------------------------------------------------------------------------------------------------  I am having Howard [name] "Greyson Houdek" maintain his multivitamin, Vitamin D3, aspirin, psyllium, albuterol, simvastatin, fluticasone, montelukast, isosorbide mononitrate, ENTRESTO, spironolactone, chlorthalidone, metoprolol succinate, metFORMIN, and omeprazole.   No orders of the defined types were placed in this  encounter.  Patient's Medications  New Prescriptions   No medications on file  Previous Medications   ALBUTEROL (PROVENTIL HFA;VENTOLIN HFA) 108 (90 BASE) MCG/ACT INHALER    Inhale 1-2 puffs into the lungs every 6 (six) hours as needed for wheezing or shortness of breath.   ASPIRIN 81 MG TABLET    Take 81 mg by mouth daily.     CHLORTHALIDONE (HYGROTON) 25 MG TABLET    Take 1 tablet (25 mg total) by mouth daily.   CHOLECALCIFEROL (VITAMIN D3) 1000 UNITS CAPS    Take 1,000 Units by mouth daily.    ENTRESTO 97-103 MG    TAKE 1 TABLET TWICE A DAY   FLUTICASONE (FLONASE) 50 MCG/ACT NASAL SPRAY    Place 2 sprays into both nostrils daily.   ISOSORBIDE MONONITRATE (IMDUR) 30 MG 24 HR TABLET    Take 1 tablet (30 mg total) by mouth daily.   METFORMIN (GLUCOPHAGE) 500 MG TABLET    Take 1 tablet (500 mg total) by mouth 2 (two) times daily with a meal.   METOPROLOL SUCCINATE (TOPROL-XL) 25 MG 24 HR TABLET    Take 1 tablet (25 mg) by mouth once daily at bedtime   MONTELUKAST (SINGULAIR) 10 MG TABLET    Take 10 mg by mouth at bedtime.   MULTIPLE VITAMIN (MULTIVITAMIN) CAPSULE    Take 1 capsule by mouth daily.     OMEPRAZOLE (PRILOSEC) 40 MG CAPSULE    Take 1 capsule (40 mg total) by mouth 2 (two) times daily as needed.   PSYLLIUM (METAMUCIL) 58.6 % POWDER    Take 1 packet by mouth 3 (three) times daily.   SIMVASTATIN (ZOCOR) 40 MG TABLET    TAKE 1 TABLET AT BEDTIME   SPIRONOLACTONE (ALDACTONE) 50 MG TABLET    Take 1 tablet (50 mg) by mouth once daily in the morning  Modified Medications   No medications on file  Discontinued Medications   No medications on file   ----------------------------------------------------------------------------------------------------------------------  Follow-up: Return in about 1 month (around 02/23/2019) for procedure.    Molli Barrows, MD

## 2019-02-05 DIAGNOSIS — R262 Difficulty in walking, not elsewhere classified: Secondary | ICD-10-CM | POA: Diagnosis not present

## 2019-02-05 DIAGNOSIS — M545 Low back pain: Secondary | ICD-10-CM | POA: Diagnosis not present

## 2019-02-10 DIAGNOSIS — M545 Low back pain: Secondary | ICD-10-CM | POA: Diagnosis not present

## 2019-02-10 DIAGNOSIS — R262 Difficulty in walking, not elsewhere classified: Secondary | ICD-10-CM | POA: Diagnosis not present

## 2019-02-16 DIAGNOSIS — M545 Low back pain: Secondary | ICD-10-CM | POA: Diagnosis not present

## 2019-02-16 DIAGNOSIS — R262 Difficulty in walking, not elsewhere classified: Secondary | ICD-10-CM | POA: Diagnosis not present

## 2019-02-19 DIAGNOSIS — R262 Difficulty in walking, not elsewhere classified: Secondary | ICD-10-CM | POA: Diagnosis not present

## 2019-02-19 DIAGNOSIS — M545 Low back pain: Secondary | ICD-10-CM | POA: Diagnosis not present

## 2019-02-22 ENCOUNTER — Other Ambulatory Visit: Payer: Self-pay | Admitting: Internal Medicine

## 2019-02-22 DIAGNOSIS — M545 Low back pain: Secondary | ICD-10-CM | POA: Diagnosis not present

## 2019-02-22 DIAGNOSIS — R262 Difficulty in walking, not elsewhere classified: Secondary | ICD-10-CM | POA: Diagnosis not present

## 2019-02-24 ENCOUNTER — Ambulatory Visit
Admission: RE | Admit: 2019-02-24 | Discharge: 2019-02-24 | Disposition: A | Discharge status: Home / Self Care | Payer: Medicare Other | Source: Ambulatory Visit | Attending: Anesthesiology | Admitting: Anesthesiology

## 2019-02-24 ENCOUNTER — Other Ambulatory Visit: Payer: Self-pay

## 2019-02-24 ENCOUNTER — Other Ambulatory Visit: Payer: Self-pay | Admitting: Anesthesiology

## 2019-02-24 ENCOUNTER — Encounter: Payer: Self-pay | Admitting: Anesthesiology

## 2019-02-24 ENCOUNTER — Ambulatory Visit (HOSPITAL_BASED_OUTPATIENT_CLINIC_OR_DEPARTMENT_OTHER): Payer: Medicare Other | Admitting: Anesthesiology

## 2019-02-24 VITALS — BP 173/91 | HR 81 | Temp 97.6°F | Resp 20 | Ht 69.0 in | Wt 185.0 lb

## 2019-02-24 DIAGNOSIS — G8929 Other chronic pain: Secondary | ICD-10-CM

## 2019-02-24 DIAGNOSIS — M5432 Sciatica, left side: Secondary | ICD-10-CM | POA: Insufficient documentation

## 2019-02-24 DIAGNOSIS — M545 Low back pain, unspecified: Secondary | ICD-10-CM

## 2019-02-24 DIAGNOSIS — M48062 Spinal stenosis, lumbar region with neurogenic claudication: Secondary | ICD-10-CM | POA: Diagnosis not present

## 2019-02-24 DIAGNOSIS — R52 Pain, unspecified: Secondary | ICD-10-CM | POA: Diagnosis not present

## 2019-02-24 DIAGNOSIS — M961 Postlaminectomy syndrome, not elsewhere classified: Secondary | ICD-10-CM

## 2019-02-24 DIAGNOSIS — M5136 Other intervertebral disc degeneration, lumbar region: Secondary | ICD-10-CM | POA: Diagnosis not present

## 2019-02-24 DIAGNOSIS — R29898 Other symptoms and signs involving the musculoskeletal system: Secondary | ICD-10-CM | POA: Insufficient documentation

## 2019-02-24 MED ORDER — TRIAMCINOLONE ACETONIDE 40 MG/ML IJ SUSP
INTRAMUSCULAR | Status: AC
  Filled 2019-02-24: qty 1

## 2019-02-24 MED ORDER — SODIUM CHLORIDE (PF) 0.9 % IJ SOLN
INTRAMUSCULAR | Status: AC
  Filled 2019-02-24: qty 10

## 2019-02-24 MED ORDER — SODIUM CHLORIDE 0.9% FLUSH
10.0000 mL | Freq: Once | INTRAVENOUS | Status: AC
  Administered 2019-02-24: 10 mL

## 2019-02-24 MED ORDER — ROPIVACAINE HCL 2 MG/ML IJ SOLN
INTRAMUSCULAR | Status: AC
  Filled 2019-02-24: qty 10

## 2019-02-24 MED ORDER — ROPIVACAINE HCL 2 MG/ML IJ SOLN
10.0000 mL | Freq: Once | INTRAMUSCULAR | Status: AC
  Administered 2019-02-24: 10 mL via EPIDURAL

## 2019-02-24 MED ORDER — IOPAMIDOL (ISOVUE-M 200) INJECTION 41%
INTRAMUSCULAR | Status: AC
  Filled 2019-02-24: qty 10

## 2019-02-24 MED ORDER — LIDOCAINE HCL (PF) 1 % IJ SOLN: 5.0000 mL | Freq: Once | INTRAMUSCULAR | Status: DC

## 2019-02-24 MED ORDER — IOPAMIDOL (ISOVUE-M 200) INJECTION 41%
20.0000 mL | Freq: Once | INTRAMUSCULAR | Status: DC | PRN
  Administered 2019-02-24: 10 mL
  Filled 2019-02-24: qty 20

## 2019-02-24 MED ORDER — LIDOCAINE HCL (PF) 1 % IJ SOLN
INTRAMUSCULAR | Status: AC
  Filled 2019-02-24: qty 5

## 2019-02-24 MED ORDER — TRIAMCINOLONE ACETONIDE 40 MG/ML IJ SUSP
40.0000 mg | Freq: Once | INTRAMUSCULAR | Status: AC
  Administered 2019-02-24: 40 mg

## 2019-02-24 NOTE — Patient Instructions (Signed)
____________________________________________________________________________________________  Post-procedure Information What to expect: Most procedures involve the use of a local anesthetic (numbing medicine), and a steroid (anti-inflammatory medicine).  The local anesthetics may cause temporary numbness and weakness of the legs or arms, depending on the location of the block. This numbness/weakness may last 4-6 hours, depending on the local anesthetic used. In rare instances, it can last up to 24 hours. While numb, you must be very careful not to injure the extremity.  After any procedure, you could expect the pain to get better within 15-20 minutes. This relief is temporary and may last 4-6 hours. Once the local anesthetics wears off, you could experience discomfort, possibly more than usual, for up to 10 (ten) days. In the case of radiofrequencies, it may last up to 6 weeks. Surgeries may take up to 8 weeks for the healing process. The discomfort is due to the irritation caused by needles going through skin and muscle. To minimize the discomfort, we recommend using ice the first day, and heat from then on. The ice should be applied for 15 minutes on, and 15 minutes off. Keep repeating this cycle until bedtime. Avoid applying the ice directly to the skin, to prevent frostbite. Heat should be used daily, until the pain improves (4-10 days). Be careful not to burn yourself.  Occasionally you may experience muscle spasms or cramps. These occur as a consequence of the irritation caused by the needle sticks to the muscle and the blood that will inevitably be lost into the surrounding muscle tissue. Blood tends to be very irritating to tissues, which tend to react by going into spasm. These spasms may start the same day of your procedure, but they may also take days to develop. This late onset type of spasm or cramp is usually caused by electrolyte imbalances triggered by the steroids, at the level of the  kidney. Cramps and spasms tend to respond well to muscle relaxants, multivitamins (some are triggered by the procedure, but may have their origins in vitamin deficiencies), and "Gatorade", or any sports drinks that can replenish any electrolyte imbalances. (If you are a diabetic, ask your pharmacist to get you a sugar-free brand.) Warm showers or baths may also be helpful. Stretching exercises are highly recommended.  General Instructions:  Be alert for signs of possible infection: redness, swelling, heat, red streaks, elevated temperature, and/or fever. These typically appear 4 to 6 days after the procedure. Immediately notify your doctor if you experience unusual bleeding, difficulty breathing, or loss of bowel or bladder control. If you experience increased pain, do not increase your pain medicine intake, unless instructed by your pain physician.  Post-Procedure Care:  Be careful in moving about. Muscle spasms in the area of the injection may occur. Applying ice or heat to the area is often helpful. The incidence of spinal headaches after epidural injections ranges between 1.4% and 6%. If you develop a headache that does not seem to respond to conservative therapy, please let your physician know. This can be treated with an epidural blood patch.   Post-procedure numbness or redness is to be expected, however it should average 4 to 6 hours. If numbness and weakness of your extremities begins to develop 4 to 6 hours after your procedure, and is felt to be progressing and worsening, immediately contact your physician.  Diet:  If you experience nausea, do not eat until this sensation goes away. If you had a "Stellate Ganglion Block" for upper extremity "Reflex Sympathetic Dystrophy", do not eat or   drink until your hoarseness goes away. In any case, always start with liquids first and if you tolerate them well, then slowly progress to more solid foods.  Activity:  For the first 4 to 6 hours after the  procedure, use caution in moving about as you may experience numbness and/or weakness. Use caution in cooking, using household electrical appliances, and climbing steps. If you need to reach your Doctor call our office: [phone] (During business hours) or [phone] (After business hours).  Business Hours: Monday-Thursday 8:00 am - 4:00 PM    Fridays: Closed     In case of an emergency: In case of emergency, call 911 or go to the nearest emergency room and have the physician there call us.  Interpretation of Procedure Every nerve block has two components: a diagnostic component, and a treatment component. Unrealistic expectations are the most common causes of "perceived failure".  In a perfect world, a single nerve block should be able to completely and permanently eliminate the pain. Sadly, the world is not perfect.  Most pain management nerve blocks are performed using local anesthetics and steroids. Steroids are responsible for any long-term benefit that you may experience. Their purpose is to decrease any chronic swelling that may exist in the area. Steroids begin to work immediately after being injected. However, most patients will not experience any benefits until 5 to 10 days after the injection, when the swelling has come down to the point where they can tell a difference. Steroids will only help if there is swelling to be treated. As such, they can assist with the diagnosis. If effective, they suggest an inflammatory component to the pain, and if ineffective, they rule out inflammation as the main cause or component of the problem. If the problem is one of mechanical compression, you will get no benefit from those steroids.   In the case of local anesthetics, they have a crucial role in the diagnosis of your condition. Most will begin to work within15 to 20 minutes after injection. The duration will depend on the type used (short- vs. Long-acting). It is of outmost importance that  patients keep tract of their pain, after the procedure. To assist with this matter, a "Post-procedure Pain Diary" is provided. Make sure to complete it and to bring it back to your follow-up appointment.  As long as the patient keeps accurate, detailed records of their symptoms after every procedure, and returns to have those interpreted, every procedure will provide us with invaluable information. Even a block that does not provide the patient with any relief, will always provide us with information about the mechanism and the origin of the pain. The only time a nerve block can be considered a waste of time is when patients do not keep track of the results, or do not keep their post-procedure appointment.  Reporting the results back to your physician The Pain Score  Pain is a subjective complaint. It cannot be seen, touched, or measured. We depend entirely on the patient's report of the pain in order to assess your condition and treatment. To evaluate the pain, we use a pain scale, where "0" means "No Pain", and a "10" is "the worst possible pain that you can even imagine" (i.e. something like been eaten alive by a shark or being torn apart by a lion).   Use the Pain Scale provided. You will frequently be asked to rate your pain. Please be accurate, remember that medical decisions will be based on your   responses. Please do not rate your pain above a 10. Doing so is actually interpreted as "symptom magnification" (exaggeration). To put this into perspective, when you tell us that your pain is at a 10 (ten), what you are saying is that there is nothing we can do to make this pain any worse. (Carefully think about that.) ____________________________________________________________________________________________   Pain Management Discharge Instructions  General Discharge Instructions :  If you need to reach your doctor call: Monday-Friday 8:00 am - 4:00 pm at [phone] or toll free [phone].   After clinic hours [phone] to have operator reach doctor.  Bring all of your medication bottles to all your appointments in the pain clinic.  To cancel or reschedule your appointment with Pain Management please remember to call 24 hours in advance to avoid a fee.  Refer to the educational materials which you have been given on: General Risks, I had my Procedure. Discharge Instructions, Post Sedation.  Post Procedure Instructions:  Please notify your doctor immediately if you have any unusual bleeding, trouble breathing or pain that is not related to your normal pain.  Depending on the type of procedure that was done, some parts of your body may feel week and/or numb.  This usually clears up by tonight or the next day.  Walk with the use of an assistive device or accompanied by an adult for the 24 hours.  You may use ice on the affected area for the first 24 hours.  Put ice in a Ziploc bag and cover with a towel and place against area 15 minutes on 15 minutes off.  You may switch to heat after 24 hours. 

## 2019-02-24 NOTE — Progress Notes (Signed)
Safety precautions to be maintained throughout the outpatient stay will include: orient to surroundings, keep bed in low position, maintain call bell within reach at all times, provide assistance with transfer out of bed and ambulation.  

## 2019-02-24 NOTE — Progress Notes (Signed)
Subjective:  Patient ID: Howard [name], male    DOB: 03-Dec-1931  Age: 85 y.o. MRN: [id number]  CC: Leg Pain (left, anterior,to knee)   Procedure: Caudal epidural steroid under fluoroscopic guidance with no sedation  HPI Howard [name] presents for reevaluation.  He was last seen several weeks ago and continues to have pain in the lower extremities low back and radiation into both lower legs.  This pain is of the same quality and characteristic and distribution is in the past.  He unfortunately has failed to gain any significant provement with conservative measures such as anti-inflammatory medications and physical therapy.  The physical therapy seems to aggravate his pain.  He also is scheduled for occupational therapy soon.  He desires to proceed with a caudal epidural today for his low back pain and leg pain.  No change in lower extremity strength is reported but he does have some chronic left lower extremity give way weakness.  His bowel and bladder function has been stable.  Outpatient Medications Prior to Visit  Medication Sig Dispense Refill  . albuterol (PROVENTIL HFA;VENTOLIN HFA) 108 (90 Base) MCG/ACT inhaler Inhale 1-2 puffs into the lungs every 6 (six) hours as needed for wheezing or shortness of breath. 1 Inhaler 0  . aspirin 81 MG tablet Take 81 mg by mouth daily.      . chlorthalidone (HYGROTON) 25 MG tablet Take 1 tablet (25 mg total) by mouth daily. 90 tablet 3  . Cholecalciferol (VITAMIN D3) 1000 UNITS CAPS Take 1,000 Units by mouth daily.     Marland Kitchen ENTRESTO 97-103 MG TAKE 1 TABLET TWICE A DAY 180 tablet 3  . fluticasone (FLONASE) 50 MCG/ACT nasal spray Place 2 sprays into both nostrils daily.    . isosorbide mononitrate (IMDUR) 30 MG 24 hr tablet Take 1 tablet (30 mg total) by mouth daily. 90 tablet 3  . metFORMIN (GLUCOPHAGE) 500 MG tablet Take 1 tablet (500 mg total) by mouth 2 (two) times daily with a meal. 180 tablet 3  . metoprolol succinate (TOPROL-XL) 25 MG 24 hr tablet  Take 1 tablet (25 mg) by mouth once daily at bedtime 90 tablet 3  . montelukast (SINGULAIR) 10 MG tablet Take 10 mg by mouth at bedtime.    . Multiple Vitamin (MULTIVITAMIN) capsule Take 1 capsule by mouth daily.      Marland Kitchen omeprazole (PRILOSEC) 40 MG capsule Take 1 capsule (40 mg total) by mouth 2 (two) times daily as needed. 180 capsule 3  . psyllium (METAMUCIL) 58.6 % powder Take 1 packet by mouth 3 (three) times daily.    . simvastatin (ZOCOR) 40 MG tablet TAKE 1 TABLET AT BEDTIME 90 tablet 3  . spironolactone (ALDACTONE) 50 MG tablet Take 1 tablet (50 mg) by mouth once daily in the morning 90 tablet 3   No facility-administered medications prior to visit.     Review of Systems CNS: No confusion or sedation Cardiac: No angina or palpitations GI: No abdominal pain or constipation Constitutional: No nausea vomiting fevers or chills  Objective:  BP (!) 173/91   Pulse 81   Temp 97.6 F (36.4 C) (Oral)   Resp 20   Ht 5\' 9"  (1.753 m)   Wt 185 lb (83.9 kg)   SpO2 98%   BMI 27.32 kg/m    BP Readings from Last 3 Encounters:  02/24/19 (!) 173/91  01/25/19 109/85  12/30/18 132/61     Wt Readings from Last 3 Encounters:  02/24/19 185 lb (83.9  kg)  01/25/19 194 lb (88 kg)  12/30/18 194 lb (88 kg)     Physical Exam Pt is alert and oriented PERRL EOMI HEART IS RRR no murmur or rub LCTA no wheezing or rales MUSCULOSKELETAL muscle tone and bulk is at baseline.  He is using a cane for stability support.  Labs  Lab Results  Component Value Date   HGBA1C 7.7 (A) 11/27/2018   HGBA1C 7.6 (A) 08/25/2018   HGBA1C 7.6 (H) 05/05/2018   Lab Results  Component Value Date   MICROALBUR 100 11/27/2018   LDLCALC 62 08/25/2018   CREATININE 0.82 10/20/2018    -------------------------------------------------------------------------------------------------------------------- Lab Results  Component Value Date   WBC 5.8 08/25/2018   HGB 14.5 08/25/2018   HCT 45.6 08/25/2018   PLT  217 08/25/2018   GLUCOSE 122 (H) 10/20/2018   CHOL 138 08/25/2018   TRIG 152 (H) 08/25/2018   HDL 46 08/25/2018   LDLCALC 62 08/25/2018   ALT 19 08/25/2018   AST 21 08/25/2018   NA 128 (L) 10/20/2018   K 4.7 10/20/2018   CL 90 (L) 10/20/2018   CREATININE 0.82 10/20/2018   BUN 15 10/20/2018   CO2 20 10/20/2018   TSH 1.650 05/05/2018   INR 0.94 10/26/2015   HGBA1C 7.7 (A) 11/27/2018   MICROALBUR 100 11/27/2018    --------------------------------------------------------------------------------------------------------------------- Dg C-arm 1-60 Min-no Report  Result Date: 02/24/2019 Fluoroscopy was utilized by the requesting physician.  No radiographic interpretation.     Assessment & Plan:   [name] was seen today for leg pain.  Diagnoses and all orders for this visit:  Lumbar post-laminectomy syndrome -     Ambulatory referral to Occupational Therapy  DDD (degenerative disc disease), lumbar  Sciatica of left side  Chronic left-sided low back pain without sciatica -     Ambulatory referral to Occupational Therapy  Spinal stenosis, lumbar region, with neurogenic claudication  Weakness of left leg -     Ambulatory referral to Occupational Therapy  Other orders -     triamcinolone acetonide (KENALOG-40) injection 40 mg -     sodium chloride flush (NS) 0.9 % injection 10 mL -     ropivacaine (PF) 2 mg/mL (0.2%) (NAROPIN) injection 10 mL -     lidocaine (PF) (XYLOCAINE) 1 % injection 5 mL -     iopamidol (ISOVUE-M) 41 % intrathecal injection 20 mL        ----------------------------------------------------------------------------------------------------------------------  Problem List Items Addressed This Visit      Unprioritized   Spinal stenosis, lumbar region, with neurogenic claudication   Relevant Medications   triamcinolone acetonide (KENALOG-40) injection 40 mg (Completed)    Other Visit Diagnoses    Lumbar post-laminectomy syndrome    -  Primary    Relevant Orders   Ambulatory referral to Occupational Therapy   DDD (degenerative disc disease), lumbar       Relevant Medications   triamcinolone acetonide (KENALOG-40) injection 40 mg (Completed)   Sciatica of left side       Chronic left-sided low back pain without sciatica       Relevant Medications   triamcinolone acetonide (KENALOG-40) injection 40 mg (Completed)   Other Relevant Orders   Ambulatory referral to Occupational Therapy   Weakness of left leg       Relevant Orders   Ambulatory referral to Occupational Therapy        ----------------------------------------------------------------------------------------------------------------------  1. Lumbar post-laminectomy syndrome We will proceed with a caudal epidural steroid today.  The risks and  benefits of been fully reviewed.  I am also going to schedule him for occupational therapy to assist. - Ambulatory referral to Occupational Therapy  2. DDD (degenerative disc disease), lumbar Continue core stretching strengthening exercises as tolerated and with a caudal epidural today.  3. Sciatica of left side As above and return to clinic in 1 month  4. Chronic left-sided low back pain without sciatica  - Ambulatory referral to Occupational Therapy  5. Spinal stenosis, lumbar region, with neurogenic claudication   6. Weakness of left leg  - Ambulatory referral to Occupational Therapy    ----------------------------------------------------------------------------------------------------------------------  I am having Howard [name] "Howard [name]" maintain his multivitamin, Vitamin D3, aspirin, psyllium, albuterol, simvastatin, fluticasone, montelukast, isosorbide mononitrate, spironolactone, chlorthalidone, metoprolol succinate, metFORMIN, omeprazole, and ENTRESTO. We administered triamcinolone acetonide, sodium chloride flush, ropivacaine (PF) 2 mg/mL (0.2%), and iopamidol.   Meds ordered this encounter   Medications  . triamcinolone acetonide (KENALOG-40) injection 40 mg  . sodium chloride flush (NS) 0.9 % injection 10 mL  . ropivacaine (PF) 2 mg/mL (0.2%) (NAROPIN) injection 10 mL  . lidocaine (PF) (XYLOCAINE) 1 % injection 5 mL  . iopamidol (ISOVUE-M) 41 % intrathecal injection 20 mL   Patient's Medications  New Prescriptions   No medications on file  Previous Medications   ALBUTEROL (PROVENTIL HFA;VENTOLIN HFA) 108 (90 BASE) MCG/ACT INHALER    Inhale 1-2 puffs into the lungs every 6 (six) hours as needed for wheezing or shortness of breath.   ASPIRIN 81 MG TABLET    Take 81 mg by mouth daily.     CHLORTHALIDONE (HYGROTON) 25 MG TABLET    Take 1 tablet (25 mg total) by mouth daily.   CHOLECALCIFEROL (VITAMIN D3) 1000 UNITS CAPS    Take 1,000 Units by mouth daily.    ENTRESTO 97-103 MG    TAKE 1 TABLET TWICE A DAY   FLUTICASONE (FLONASE) 50 MCG/ACT NASAL SPRAY    Place 2 sprays into both nostrils daily.   ISOSORBIDE MONONITRATE (IMDUR) 30 MG 24 HR TABLET    Take 1 tablet (30 mg total) by mouth daily.   METFORMIN (GLUCOPHAGE) 500 MG TABLET    Take 1 tablet (500 mg total) by mouth 2 (two) times daily with a meal.   METOPROLOL SUCCINATE (TOPROL-XL) 25 MG 24 HR TABLET    Take 1 tablet (25 mg) by mouth once daily at bedtime   MONTELUKAST (SINGULAIR) 10 MG TABLET    Take 10 mg by mouth at bedtime.   MULTIPLE VITAMIN (MULTIVITAMIN) CAPSULE    Take 1 capsule by mouth daily.     OMEPRAZOLE (PRILOSEC) 40 MG CAPSULE    Take 1 capsule (40 mg total) by mouth 2 (two) times daily as needed.   PSYLLIUM (METAMUCIL) 58.6 % POWDER    Take 1 packet by mouth 3 (three) times daily.   SIMVASTATIN (ZOCOR) 40 MG TABLET    TAKE 1 TABLET AT BEDTIME   SPIRONOLACTONE (ALDACTONE) 50 MG TABLET    Take 1 tablet (50 mg) by mouth once daily in the morning  Modified Medications   No medications on file  Discontinued Medications   No medications on file    ----------------------------------------------------------------------------------------------------------------------  Follow-up: Return for procedure.  After informed consent was obtained and the risks benefits reviewed patient chose to pursue a caudal epidural steroid injection today. With the patient in the prone position and an IV in place, sedation was with IV versed. Using AP and lateral fluoroscopic guidance I identified the area overlying  the sacral hiatus. This area was broadly prepped with DuraPrep 3 and we utilized strict aseptic technique during the procedure. 1% lidocaine was infiltrated 2 cc using a 25-gauge needle overlying the sacral cornu and then using lateral fluoroscopic guidance I advanced an 18-gauge Touhy needle approximately 2 cm through the sacral hiatus. Confirmation was with 2 cc of Isovue yielding good epidural spread and no evidence of IV or subarachnoid uptake. This was followed by an injection of 5 cc of saline mixed with 1 cc of ropivacaine 0.2% and 40 mg of triamcinolone. This was tolerated without difficulty the patient was convalesced and discharged home in stable condition for follow-up as mentioned. Colman Cater, MD

## 2019-02-24 NOTE — Addendum Note (Signed)
Addended by: Molli Barrows on: 02/24/2019 04:50 PM   Modules accepted: Orders

## 2019-02-25 ENCOUNTER — Telehealth: Payer: Self-pay

## 2019-02-25 NOTE — Telephone Encounter (Signed)
Post procedure phone call.  Family member states that he is doing well.

## 2019-03-04 DIAGNOSIS — M545 Low back pain: Secondary | ICD-10-CM | POA: Diagnosis not present

## 2019-03-04 DIAGNOSIS — R262 Difficulty in walking, not elsewhere classified: Secondary | ICD-10-CM | POA: Diagnosis not present

## 2019-03-17 ENCOUNTER — Telehealth: Payer: Self-pay | Admitting: *Deleted

## 2019-03-17 NOTE — Telephone Encounter (Signed)
Per Dr. Andree Elk,  with verbal phone read back order, the following medication was called in for patient to [facility] in [place] Prednisone 10mg  tab: 60mg  day 1 60mg         2 50mg         3 50mg         4 40mg          5 40mg          6 30mg          7 30mg          8 20mg          9 20mg         10 10mg          11 10mg          12 stop

## 2019-03-18 ENCOUNTER — Telehealth: Payer: Self-pay | Admitting: Anesthesiology

## 2019-03-18 NOTE — Telephone Encounter (Signed)
Called pt to do prescreening questions for appt Monday, pt was scheduled for a procedure. Pts wife stated that they spoke to Dr. Andree Elk yesterday and he gave pt prednisone and told them to cancel the procedure for Monday and to schedule for a later date. Pts wife would like to know when she is supposed to schedule the procedure.

## 2019-03-18 NOTE — Telephone Encounter (Signed)
Advised Howard [name] to call in 2-3 weeks to find out if we can reschedule at that time.

## 2019-03-22 ENCOUNTER — Ambulatory Visit: Payer: Medicare Other | Admitting: Anesthesiology

## 2019-03-24 ENCOUNTER — Ambulatory Visit: Payer: Medicare Other

## 2019-03-26 ENCOUNTER — Other Ambulatory Visit: Payer: Self-pay | Admitting: *Deleted

## 2019-03-31 ENCOUNTER — Other Ambulatory Visit: Payer: Self-pay

## 2019-03-31 ENCOUNTER — Ambulatory Visit (INDEPENDENT_AMBULATORY_CARE_PROVIDER_SITE_OTHER): Payer: Medicare Other | Admitting: *Deleted

## 2019-03-31 DIAGNOSIS — I442 Atrioventricular block, complete: Secondary | ICD-10-CM | POA: Diagnosis not present

## 2019-04-01 ENCOUNTER — Telehealth: Payer: Self-pay | Admitting: *Deleted

## 2019-04-01 LAB — CUP PACEART REMOTE DEVICE CHECK
Battery Remaining Longevity: 1 mo
Battery Remaining Percentage: 0.5 %
Battery Voltage: 2.6 V
Brady Statistic AP VP Percent: 4.1 %
Brady Statistic AP VS Percent: 1 %
Brady Statistic AS VP Percent: 96 %
Brady Statistic AS VS Percent: 1 %
Brady Statistic RA Percent Paced: 4 %
Brady Statistic RV Percent Paced: 99 %
Date Time Interrogation Session: 20200408061219
Implantable Lead Implant Date: 20111208
Implantable Lead Implant Date: 20111208
Implantable Lead Location: 753859
Implantable Lead Location: 753860
Implantable Lead Model: 1948
Implantable Pulse Generator Implant Date: 20111208
Lead Channel Impedance Value: 400 Ohm
Lead Channel Impedance Value: 460 Ohm
Lead Channel Pacing Threshold Amplitude: 0.75 V
Lead Channel Pacing Threshold Amplitude: 1.25 V
Lead Channel Pacing Threshold Pulse Width: 0.4 ms
Lead Channel Pacing Threshold Pulse Width: 0.8 ms
Lead Channel Sensing Intrinsic Amplitude: 12 mV
Lead Channel Sensing Intrinsic Amplitude: 2.6 mV
Lead Channel Setting Pacing Amplitude: 2 V
Lead Channel Setting Pacing Amplitude: 2.5 V
Lead Channel Setting Pacing Pulse Width: 0.8 ms
Lead Channel Setting Sensing Sensitivity: 4 mV
Pulse Gen Model: 2210
Pulse Gen Serial Number: 7196739

## 2019-04-01 NOTE — Telephone Encounter (Signed)
Attempted to reach patient regarding PPM battery nearing ERI (estimated <3 months). No answer, phone would ring and abruptly would receive 3 quick beeps suggesting call was dropped. Will try again another day.   Begin monthly automatic battery checks. Next scheduled for 04/30/19, auto schedule updated in Alex to transmit every 31 days.

## 2019-04-05 NOTE — Telephone Encounter (Signed)
Spoke with patient's wife, [name] ([place]). She is aware of battery nearing ERI, agreeable to monthly battery checks via remote monitor. Advised we will call once battery is at ERI to schedule f/u with MD. Pt's wife verbalizes understanding and thanked me for my call.

## 2019-04-09 ENCOUNTER — Encounter: Payer: Self-pay | Admitting: Cardiology

## 2019-04-09 ENCOUNTER — Other Ambulatory Visit: Payer: Self-pay | Admitting: *Deleted

## 2019-04-09 NOTE — Progress Notes (Signed)
Remote pacemaker transmission.   

## 2019-04-14 ENCOUNTER — Ambulatory Visit: Payer: Medicare Other

## 2019-04-21 ENCOUNTER — Ambulatory Visit: Payer: Medicare Other | Admitting: Radiation Oncology

## 2019-04-30 ENCOUNTER — Telehealth: Payer: Self-pay | Admitting: *Deleted

## 2019-04-30 ENCOUNTER — Ambulatory Visit (INDEPENDENT_AMBULATORY_CARE_PROVIDER_SITE_OTHER): Payer: Medicare Other | Admitting: *Deleted

## 2019-04-30 ENCOUNTER — Other Ambulatory Visit: Payer: Self-pay

## 2019-04-30 DIAGNOSIS — Z950 Presence of cardiac pacemaker: Secondary | ICD-10-CM

## 2019-04-30 LAB — CUP PACEART REMOTE DEVICE CHECK
Date Time Interrogation Session: 20200508172014
Implantable Lead Implant Date: 20111208
Implantable Lead Implant Date: 20111208
Implantable Lead Location: 753859
Implantable Lead Location: 753860
Implantable Lead Model: 1948
Implantable Pulse Generator Implant Date: 20111208
Pulse Gen Model: 2210
Pulse Gen Serial Number: 7196739

## 2019-04-30 NOTE — Telephone Encounter (Signed)
Spoke with patient's wife to advise that PPM transmission from today shows ERI is estimated in 0-3 months. Discussed process once device is at [facility]. Advised to continue monthly battery checks for now, next transmission scheduled for 05/28/19. Monitor transmitting automatically. Pt's wife verbalizes understanding and thanked me for my call.

## 2019-05-04 ENCOUNTER — Encounter: Payer: Self-pay | Admitting: Cardiology

## 2019-05-04 NOTE — Progress Notes (Signed)
Remote pacemaker transmission.   

## 2019-05-27 ENCOUNTER — Telehealth: Payer: Self-pay

## 2019-05-27 NOTE — Telephone Encounter (Signed)
Spoke with patients wife (okay per DPR)  Made them aware Dr. [name] was limiting patients coming into the office due to [place].  Offered a telehealth appointment.   Patients wife stated that they have been following closely with the device clinic in [place] due to patients device battery getting low. She stated that since they are following up with them at this time they would like to cancel this appointment.

## 2019-05-28 ENCOUNTER — Ambulatory Visit (INDEPENDENT_AMBULATORY_CARE_PROVIDER_SITE_OTHER): Payer: Medicare Other | Admitting: *Deleted

## 2019-05-28 DIAGNOSIS — Z950 Presence of cardiac pacemaker: Secondary | ICD-10-CM

## 2019-05-28 LAB — CUP PACEART REMOTE DEVICE CHECK
Battery Remaining Longevity: 1 mo
Battery Remaining Percentage: 0.5 %
Battery Voltage: 2.57 V
Brady Statistic AP VP Percent: 3.2 %
Brady Statistic AP VS Percent: 1 %
Brady Statistic AS VP Percent: 97 %
Brady Statistic AS VS Percent: 1 %
Brady Statistic RA Percent Paced: 3.1 %
Brady Statistic RV Percent Paced: 99 %
Date Time Interrogation Session: 20200605060006
Implantable Lead Implant Date: 20111208
Implantable Lead Implant Date: 20111208
Implantable Lead Location: 753859
Implantable Lead Location: 753860
Implantable Lead Model: 1948
Implantable Pulse Generator Implant Date: 20111208
Lead Channel Impedance Value: 430 Ohm
Lead Channel Impedance Value: 490 Ohm
Lead Channel Pacing Threshold Amplitude: 0.75 V
Lead Channel Pacing Threshold Amplitude: 1.25 V
Lead Channel Pacing Threshold Pulse Width: 0.4 ms
Lead Channel Pacing Threshold Pulse Width: 0.8 ms
Lead Channel Sensing Intrinsic Amplitude: 12 mV
Lead Channel Sensing Intrinsic Amplitude: 3.5 mV
Lead Channel Setting Pacing Amplitude: 2 V
Lead Channel Setting Pacing Amplitude: 2.5 V
Lead Channel Setting Pacing Pulse Width: 0.8 ms
Lead Channel Setting Sensing Sensitivity: 4 mV
Pulse Gen Model: 2210
Pulse Gen Serial Number: 7196739

## 2019-05-31 ENCOUNTER — Telehealth: Payer: Self-pay | Admitting: Student

## 2019-05-31 NOTE — Telephone Encounter (Signed)
Spoke with patients wife (Pt HOH) to let her know device is now at ERI, and he will need an appointment to discuss risk/benefits of gen change. Questions answered.    Legrand Como [address] [place], PA-C 05/31/2019 10:53 AM

## 2019-06-01 NOTE — Telephone Encounter (Signed)
When did he hit ERI? This will help me out a lot with scheduling- thanks!

## 2019-06-01 NOTE — Telephone Encounter (Signed)
Battery voltage is currently 2.57V, ERI voltage 2.60V. ERI not yet triggered, but anticipated in 0-3 months. Plan for battery recheck on 7/6 via automatic [place] transmission.

## 2019-06-02 ENCOUNTER — Telehealth: Payer: Self-pay | Admitting: Internal Medicine

## 2019-06-02 NOTE — Telephone Encounter (Signed)
Spoke to pt wife, explained that appt will be made after 06/28/19 transmission.

## 2019-06-02 NOTE — Telephone Encounter (Signed)
Noted. Will follow up with Device clinic after 06/28/19 to see what repeat transmission shows.

## 2019-06-02 NOTE — Telephone Encounter (Signed)
Patient's wife called stating that someone called her from stating that the nurse would be calling her. If she didn't hear from office by Wednesday to give [place] a call.

## 2019-06-02 NOTE — Telephone Encounter (Signed)
Noted. Will follow up with Device Clinic after 06/28/19 to see if ERI has triggered.

## 2019-06-03 NOTE — Telephone Encounter (Signed)
Noted  

## 2019-06-04 ENCOUNTER — Encounter: Payer: Self-pay | Admitting: Cardiology

## 2019-06-04 NOTE — Progress Notes (Signed)
Remote pacemaker transmission.   

## 2019-06-08 ENCOUNTER — Ambulatory Visit: Payer: Medicare Other | Admitting: Cardiovascular Disease

## 2019-06-14 ENCOUNTER — Other Ambulatory Visit: Payer: Self-pay

## 2019-06-14 ENCOUNTER — Other Ambulatory Visit: Payer: Self-pay | Admitting: *Deleted

## 2019-06-14 ENCOUNTER — Ambulatory Visit
Admission: RE | Admit: 2019-06-14 | Discharge: 2019-06-14 | Disposition: A | Discharge status: Home / Self Care | Payer: Medicare Other | Source: Ambulatory Visit | Attending: Radiation Oncology | Admitting: Radiation Oncology

## 2019-06-14 ENCOUNTER — Inpatient Hospital Stay: Payer: Medicare Other | Attending: Hematology and Oncology

## 2019-06-14 DIAGNOSIS — C3411 Malignant neoplasm of upper lobe, right bronchus or lung: Secondary | ICD-10-CM | POA: Diagnosis not present

## 2019-06-14 DIAGNOSIS — I700 Atherosclerosis of aorta: Secondary | ICD-10-CM | POA: Diagnosis not present

## 2019-06-14 DIAGNOSIS — C3491 Malignant neoplasm of unspecified part of right bronchus or lung: Secondary | ICD-10-CM | POA: Diagnosis not present

## 2019-06-14 LAB — CREATININE, SERUM
Creatinine, Ser: 1.08 mg/dL (ref 0.61–1.24)
GFR calc Af Amer: >60 mL/min (ref >60)
GFR calc non Af Amer: >60 mL/min (ref >60)

## 2019-06-14 MED ORDER — IOHEXOL 300 MG/ML  SOLN
75.0000 mL | Freq: Once | INTRAMUSCULAR | Status: AC | PRN
  Administered 2019-06-14: 75 mL via INTRAVENOUS

## 2019-06-16 ENCOUNTER — Telehealth: Payer: Self-pay

## 2019-06-16 ENCOUNTER — Telehealth: Payer: Self-pay | Admitting: Family Medicine

## 2019-06-16 NOTE — Chronic Care Management (AMB) (Signed)
Chronic Care Management   Note  06/16/2019 Name: DERWIN REDDY MRN: [id number] DOB: 1931/03/22  MYRICK MCNAIRY is a 85 y.o. year old male who is a primary care patient of [place], [name], [place]. I reached out to [name] by phone today in response to a referral sent by Mr. Egor [facility] health plan.    Mr. Weathington was given information about Chronic Care Management services today including:  1. CCM service includes personalized support from designated clinical staff supervised by his physician, including individualized plan of care and coordination with other care providers 2. 24/7 contact phone numbers for assistance for urgent and routine care needs. 3. Service will only be billed when office clinical staff spend 20 minutes or more in a month to coordinate care. 4. Only one practitioner may furnish and bill the service in a calendar month. 5. The patient may stop CCM services at any time (effective at the end of the month) by phone call to the office staff. 6. The patient will be responsible for cost sharing (co-pay) of up to 20% of the service fee (after annual deductible is met).  Patients wife [name] did not agree to enrollment in care management services and does not wish to consider at this time.  Follow up plan: The care management team is available to follow up with the patient after provider conversation with the patient regarding recommendation for care management engagement and subsequent re-referral to the care management team.   [place]  [email or web address]   ??[phone]

## 2019-06-16 NOTE — Telephone Encounter (Signed)
Spoke to pt wife regarding RRT alert triggered on 06/14/19. Pt wife verbalizes understanding.

## 2019-06-16 NOTE — Telephone Encounter (Signed)
Charlynne Pander, when you're done working up the current schedules for that 7/2 & 7/9 switch, if there is still space on 7/9 can you call this patient and offer him a slot.  He will need to come in and talk with Dr. Caryl Comes about a battery replacement for his device.   Device Clinic has already notified him about the battery.  Thanks!

## 2019-06-17 ENCOUNTER — Other Ambulatory Visit: Payer: Self-pay

## 2019-06-18 ENCOUNTER — Other Ambulatory Visit: Payer: Self-pay

## 2019-06-18 ENCOUNTER — Encounter: Payer: Self-pay | Admitting: Radiation Oncology

## 2019-06-18 ENCOUNTER — Other Ambulatory Visit: Payer: Self-pay | Admitting: *Deleted

## 2019-06-18 ENCOUNTER — Ambulatory Visit
Admission: RE | Admit: 2019-06-18 | Discharge: 2019-06-18 | Disposition: A | Discharge status: Home / Self Care | Payer: Medicare Other | Source: Ambulatory Visit | Attending: Radiation Oncology | Admitting: Radiation Oncology

## 2019-06-18 VITALS — BP 148/67 | HR 75 | Temp 97.8°F | Resp 16 | Wt 195.3 lb

## 2019-06-18 DIAGNOSIS — R918 Other nonspecific abnormal finding of lung field: Secondary | ICD-10-CM | POA: Insufficient documentation

## 2019-06-18 DIAGNOSIS — C3411 Malignant neoplasm of upper lobe, right bronchus or lung: Secondary | ICD-10-CM | POA: Diagnosis not present

## 2019-06-18 DIAGNOSIS — F17210 Nicotine dependence, cigarettes, uncomplicated: Secondary | ICD-10-CM | POA: Insufficient documentation

## 2019-06-18 DIAGNOSIS — C342 Malignant neoplasm of middle lobe, bronchus or lung: Secondary | ICD-10-CM | POA: Diagnosis present

## 2019-06-18 DIAGNOSIS — Z923 Personal history of irradiation: Secondary | ICD-10-CM | POA: Diagnosis not present

## 2019-06-18 DIAGNOSIS — C3412 Malignant neoplasm of upper lobe, left bronchus or lung: Secondary | ICD-10-CM | POA: Diagnosis not present

## 2019-06-18 NOTE — Progress Notes (Signed)
Radiation Oncology Follow up Note  Name: Howard [name]   Date:   06/18/2019 MRN:  [id number] DOB: Mar 18, 1931    This 85 y.o. male presents to the clinic today for 3-year follow-up status post SBRT to his right upper middle lobe for stage I adenocarcinoma.Marland Kitchen  REFERRING PROVIDER: Margo Common, PA  HPI: Patient is an 85 year old male now out over 3 years having completed SBRT to his right upper lobe right middle lobe for stage I adenocarcinoma.  Seen today in routine follow-up he is doing well.  He specifically denies cough hemoptysis or chest tightness.  He had a recent CT scan.  Showing stable changes in the right upper lobe/right middle lobe.  At the right right lung base there is a suspicious mass measuring 2.6 cm adenocarcinoma being considered.  On my review going back over 3 years this lesion has been present and has been unchanged in its size.  COMPLICATIONS OF TREATMENT: none  FOLLOW UP COMPLIANCE: keeps appointments   PHYSICAL EXAM:  BP (!) 148/67 (BP Location: Left Arm, Patient Position: Sitting)   Pulse 75   Temp 97.8 F (36.6 C) (Tympanic)   Resp 16   Wt 195 lb 5.2 oz (88.6 kg)   BMI 28.84 kg/m  Well-developed well-nourished patient in NAD. HEENT reveals PERLA, EOMI, discs not visualized.  Oral cavity is clear. No oral mucosal lesions are identified. Neck is clear without evidence of cervical or supraclavicular adenopathy. Lungs are clear to A&P. Cardiac examination is essentially unremarkable with regular rate and rhythm without murmur rub or thrill. Abdomen is benign with no organomegaly or masses noted. Motor sensory and DTR levels are equal and symmetric in the upper and lower extremities. Cranial nerves II through XII are grossly intact. Proprioception is intact. No peripheral adenopathy or edema is identified. No motor or sensory levels are noted. Crude visual fields are within normal range.  RADIOLOGY RESULTS: CT scan reviewed compatible with above-stated  findings  PLAN: Present time patient is doing well excellent response by CT criteria to his stage I right upper lobe adenocarcinoma.  The lesion in the right lower lobe has unchanged over the past 3 years I doubt this is a malignancy although we will continue to observe.  I have asked to see him back in 1 year for follow-up with a CT scan prior to his visit.  Patient knows to call at anytime with any concerns.  I would like to take this opportunity to thank you for allowing me to participate in the care of your patient.Noreene Filbert, MD

## 2019-06-22 ENCOUNTER — Telehealth: Payer: Self-pay | Admitting: Internal Medicine

## 2019-06-22 NOTE — Telephone Encounter (Signed)
Spoke with the pt wife. Pt wife that the pt needs to have a treatment (that includes prednisone) at the center, but they are concerned that it may interfere with the pt needed generator change. The pt is scheduled to see Dr. Caryl Comes on 07/01/19. The pt generator change procedure is not yet scheduled. Adv the pt wifethat I will fwd the message to Dr. Caryl Comes and we will call back with his response.

## 2019-06-22 NOTE — Telephone Encounter (Signed)
Patient spouse calling Needs to know if Dr Caryl Comes feels it would be safe for patient to have a treatment at the pain center before having his battery changed Please call to discuss

## 2019-06-23 NOTE — Telephone Encounter (Signed)
He has at least 3 months before anything has to be done regarding his generator replacement So he can go ahead with his back L thx SK

## 2019-06-24 NOTE — Telephone Encounter (Signed)
Spoke with the pt wife and made her aware of Dr.Klein's response. Pt wife verbalized understanding and voiced appreciation for the call.

## 2019-06-28 ENCOUNTER — Ambulatory Visit (INDEPENDENT_AMBULATORY_CARE_PROVIDER_SITE_OTHER): Payer: Medicare Other | Admitting: *Deleted

## 2019-06-28 DIAGNOSIS — Z950 Presence of cardiac pacemaker: Secondary | ICD-10-CM

## 2019-06-28 LAB — CUP PACEART REMOTE DEVICE CHECK
Date Time Interrogation Session: 20200706132423
Implantable Lead Implant Date: 20111208
Implantable Lead Implant Date: 20111208
Implantable Lead Location: 753859
Implantable Lead Location: 753860
Implantable Lead Model: 1948
Implantable Pulse Generator Implant Date: 20111208
Pulse Gen Model: 2210
Pulse Gen Serial Number: 7196739

## 2019-06-29 ENCOUNTER — Ambulatory Visit: Payer: Medicare Other | Attending: Anesthesiology | Admitting: Anesthesiology

## 2019-06-29 ENCOUNTER — Other Ambulatory Visit: Payer: Self-pay | Admitting: Anesthesiology

## 2019-06-29 ENCOUNTER — Other Ambulatory Visit: Payer: Self-pay

## 2019-06-29 ENCOUNTER — Encounter: Payer: Self-pay | Admitting: Anesthesiology

## 2019-06-29 DIAGNOSIS — M5136 Other intervertebral disc degeneration, lumbar region: Secondary | ICD-10-CM

## 2019-06-29 DIAGNOSIS — G8929 Other chronic pain: Secondary | ICD-10-CM

## 2019-06-29 DIAGNOSIS — M5432 Sciatica, left side: Secondary | ICD-10-CM | POA: Diagnosis not present

## 2019-06-29 DIAGNOSIS — M48062 Spinal stenosis, lumbar region with neurogenic claudication: Secondary | ICD-10-CM | POA: Diagnosis not present

## 2019-06-29 DIAGNOSIS — R29898 Other symptoms and signs involving the musculoskeletal system: Secondary | ICD-10-CM | POA: Diagnosis not present

## 2019-06-29 DIAGNOSIS — M961 Postlaminectomy syndrome, not elsewhere classified: Secondary | ICD-10-CM

## 2019-06-29 DIAGNOSIS — M545 Low back pain: Secondary | ICD-10-CM

## 2019-06-29 NOTE — Progress Notes (Signed)
Virtual Visit via Telephone Note  I connected with Howard [name] on 06/29/19 at  1:30 PM EDT by telephone and verified that I am speaking with the correct person using two identifiers.  Location: Patient: Home Provider: Pain control center   I discussed the limitations, risks, security and privacy concerns of performing an evaluation and management service by telephone and the availability of in person appointments. I also discussed with the patient that there may be a patient responsible charge related to this service. The patient expressed understanding and agreed to proceed.   History of Present Illness: I spoke with Howard [name] and his wife via phone.  They were unable to do video voice conferencing.  He is continued had severe low back pain and it has become debilitating.  He also has left leg anterior thigh give way weakness with prolonged standing.  All the symptoms do get better with sitting however his legs fatigue easily with prolonged standing.  In the past he has had epidural steroid injections with variable results but it does appear that his symptoms are progressing causing more lower extremity weakness.  He has had good success 3 months ago with a steroid Dosepak that gave him significant improvement for approximately 1 month.  Otherwise no other changes are reported at this time.    Observations/Objective: Current Outpatient Medications:  .  albuterol (PROVENTIL HFA;VENTOLIN HFA) 108 (90 Base) MCG/ACT inhaler, Inhale 1-2 puffs into the lungs every 6 (six) hours as needed for wheezing or shortness of breath., Disp: 1 Inhaler, Rfl: 0 .  aspirin 81 MG tablet, Take 81 mg by mouth daily.  , Disp: , Rfl:  .  chlorthalidone (HYGROTON) 25 MG tablet, Take 1 tablet (25 mg total) by mouth daily., Disp: 90 tablet, Rfl: 3 .  Cholecalciferol (VITAMIN D3) 1000 UNITS CAPS, Take 1,000 Units by mouth daily. , Disp: , Rfl:  .  ENTRESTO 97-103 MG, TAKE 1 TABLET TWICE A DAY, Disp: 180 tablet, Rfl:  3 .  fluticasone (FLONASE) 50 MCG/ACT nasal spray, Place 2 sprays into both nostrils daily., Disp: , Rfl:  .  isosorbide mononitrate (IMDUR) 30 MG 24 hr tablet, Take 1 tablet (30 mg total) by mouth daily., Disp: 90 tablet, Rfl: 3 .  metFORMIN (GLUCOPHAGE) 500 MG tablet, Take 1 tablet (500 mg total) by mouth 2 (two) times daily with a meal., Disp: 180 tablet, Rfl: 3 .  metoprolol succinate (TOPROL-XL) 25 MG 24 hr tablet, Take 1 tablet (25 mg) by mouth once daily at bedtime, Disp: 90 tablet, Rfl: 3 .  montelukast (SINGULAIR) 10 MG tablet, Take 10 mg by mouth at bedtime., Disp: , Rfl:  .  Multiple Vitamin (MULTIVITAMIN) capsule, Take 1 capsule by mouth daily.  , Disp: , Rfl:  .  omeprazole (PRILOSEC) 40 MG capsule, Take 1 capsule (40 mg total) by mouth 2 (two) times daily as needed., Disp: 180 capsule, Rfl: 3 .  psyllium (METAMUCIL) 58.6 % powder, Take 1 packet by mouth 3 (three) times daily., Disp: , Rfl:  .  simvastatin (ZOCOR) 40 MG tablet, TAKE 1 TABLET AT BEDTIME, Disp: 90 tablet, Rfl: 3 .  spironolactone (ALDACTONE) 50 MG tablet, Take 1 tablet (50 mg) by mouth once daily in the morning, Disp: 90 tablet, Rfl: 3   Assessment and Plan: 1. Lumbar post-laminectomy syndrome   2. DDD (degenerative disc disease), lumbar   3. Sciatica of left side   4. Chronic left-sided low back pain without sciatica   5. Spinal stenosis, lumbar  region, with neurogenic claudication   6. Weakness of left leg    Based on our discussion today I am going to call in a steroid Dosepak to see if this can give him some relief.  We have also discussed options regarding potential caudal or lumbar epidural steroid injections going forward to see if this can give him some relief.  He has seen his neurosurgeon recently and is status post multiple decompressive surgeries and he has been deemed a nonsurgical candidate. Return to clinic in 1 month for reevaluation and consideration for possible medication management.  He is also  scheduled to see his cardiologist for a pacemaker battery reimplant.  Follow Up Instructions:    I discussed the assessment and treatment plan with the patient. The patient was provided an opportunity to ask questions and all were answered. The patient agreed with the plan and demonstrated an understanding of the instructions.   The patient was advised to call back or seek an in-person evaluation if the symptoms worsen or if the condition fails to improve as anticipated.  I provided 30 minutes of non-face-to-face time during this encounter.   Molli Barrows, MD

## 2019-06-30 ENCOUNTER — Telehealth: Payer: Self-pay

## 2019-06-30 NOTE — Telephone Encounter (Signed)
COVID-19 Pre-Screening Questions:   ?   In the past 7 to 10 days have you had a cough, shortness of breath, headache, congestion, fever (100 or greater) body aches, chills, sore throat, or sudden loss of taste or sense of smell?  No  Have you been around anyone with known Covid 19.NO  Have you been around anyone who is awaiting Covid 19 test results in the past 7 to 10 days? No  Have you been around anyone who has been exposed to Covid 19, or has mentioned symptoms of Covid 19 within the past 7 to 10 days? NO   If you have any concerns/questions about symptoms patients report during screening (either on the phone or at threshold). Contact the provider seeing the patient or DOD for further guidance. If neither are available contact a member of the leadership team."

## 2019-07-01 ENCOUNTER — Ambulatory Visit (INDEPENDENT_AMBULATORY_CARE_PROVIDER_SITE_OTHER): Payer: Medicare Other | Admitting: Internal Medicine

## 2019-07-01 ENCOUNTER — Other Ambulatory Visit: Payer: Self-pay

## 2019-07-01 ENCOUNTER — Encounter: Payer: Self-pay | Admitting: *Deleted

## 2019-07-01 ENCOUNTER — Encounter: Payer: Self-pay | Admitting: Internal Medicine

## 2019-07-01 VITALS — BP 138/66 | HR 78 | Ht 69.5 in | Wt 190.0 lb

## 2019-07-01 DIAGNOSIS — I509 Heart failure, unspecified: Secondary | ICD-10-CM

## 2019-07-01 DIAGNOSIS — I442 Atrioventricular block, complete: Secondary | ICD-10-CM

## 2019-07-01 DIAGNOSIS — Z950 Presence of cardiac pacemaker: Secondary | ICD-10-CM | POA: Diagnosis not present

## 2019-07-01 DIAGNOSIS — I428 Other cardiomyopathies: Secondary | ICD-10-CM | POA: Insufficient documentation

## 2019-07-01 DIAGNOSIS — Z01812 Encounter for preprocedural laboratory examination: Secondary | ICD-10-CM | POA: Diagnosis not present

## 2019-07-01 NOTE — Patient Instructions (Signed)
Medication Instructions:  - Your physician recommends that you continue on your current medications as directed. Please refer to the Current Medication list given to you today.  If you need a refill on your cardiac medications before your next appointment, please call your pharmacy.   Lab work: - Your physician recommends that you have lab work today: BMP/ CBC  If you have labs (blood work) drawn today and your tests are completely normal, you will receive your results only by: Marland Kitchen MyChart Message (if you have MyChart) OR . A paper copy in the mail If you have any lab test that is abnormal or we need to change your treatment, we will call you to review the results.  Testing/Procedures: - Your physician has recommended that you have a pacemaker generator (battery) change out. See attached pre-procedure instruction sheet.  Follow-Up: At [facility], you and your health needs are our priority.  As part of our continuing mission to provide you with exceptional heart care, we have created designated Provider Care Teams.  These Care Teams include your primary Cardiologist (physician) and Advanced Practice Providers (APPs -  Physician Assistants and Nurse Practitioners) who all work together to provide you with the care you need, when you need it. You will need a follow up appointment in 3 months (91 days from 07/09/2019) with Dr. Caryl Comes.  (late October) Please call our office 2 months in advance to schedule this appointment (call in mid August to schedule)  10-14 days (from 07/09/2019) with the [facility] in the [place] office for wound check visit    Any Other Special Instructions Will Be Listed Below (If Applicable). - N/A

## 2019-07-01 NOTE — Progress Notes (Signed)
,      Patient Care Team: Chrismon, [name], PA as PCP - General (Family Medicine) [name] Kathlene November, MD as Consulting Physician (Cardiology) Dasher, [name], MD as Consulting Physician (Dermatology) Idelle Crouch [name], Student-RN as Student Nurse (Student) Birder Robson, MD as Referring Physician (Ophthalmology) [name], MD as Consulting Physician (Vascular Surgery) Deboraha Sprang, MD as Consulting Physician (Cardiology) Erby Pian, MD as Referring Physician (Specialist) Molli Barrows, MD as Consulting Physician (Anesthesiology)   HPI  Howard [name] is a 85 y.o. male Seen for pacemaker follow-up for device implanted for complete heart block 12/11.  His device is reached ERI  No changes in exercise tolerance.  Has had some edema of late.  Has been largely nonambulatory both because of COVID and because of chronic back pain.  He has been followed by Dr. Rayann Heman and [name].  He has nonobstructive coronary disease with a modest nonischemic cardiomyopathy  DATE TEST EF   8/15 Echo  35-40 %   5/16 Echo  45-50 %   6/19 Echo 35-40%   8/19 MYOVIEW 33%    Date Cr K Hgb  9/19 0.85 5.2 14.5   6/20 1.08     He is on high-dose Aldactone per Dr. Deidre Ala for blood pressure.  Records and Results Reviewed  Past Medical History:  Diagnosis Date  . Arthritis   . Bell palsy   . Cancer [facility])    prostate and skin  . Chronic combined systolic and diastolic CHF, NYHA class 1 ([place])    a. 07/2014 Echo: EF 35-40%, Gr 1 DD.  Marland Kitchen Complete heart block ([place])    a. 11/2010 s/p SJM 2210 Accent DC PPM, ser# [id number].  . Depression   . Diabetes mellitus without complication ([place])   . Fall 11-10-14  . GERD (gastroesophageal reflux disease)   . History of prostate cancer   . Hyperlipidemia   . Hypertension   . LBBB (left bundle branch block)   . Left-sided Bell's palsy   . Lung cancer ([place]) 2016  . NICM (nonischemic cardiomyopathy) ([place])    a. 07/2014 Echo: EF  35-40%, mid-apicalanteroseptal DK, Gr 1 DD, mild-mod dil LA.  . Non-obstructive CAD    a. 07/2014 Abnl MV;  b. 08/2014 Cath: LM nl, LAD 30p, RI 40p, LCX nl, OM1 40, RCA dominant 30p, 70d-->Med Rx.  Marland Kitchen Poor balance   . Presence of permanent cardiac pacemaker   . Sleep apnea    a. cpap  . Vertigo   . WPW (Wolff-Parkinson-White syndrome)    a. S/P RFCA 1991.    Past Surgical History:  Procedure Laterality Date  . APPLICATION OF WOUND VAC Left 06/07/2015   Procedure: APPLICATION OF WOUND VAC;  Surgeon: Robert Bellow, MD;  Location: [facility] ORS;  Service: General;  Laterality: Left;  left upper back  . BACK SURGERY     2011  . CARDIAC CATHETERIZATION  08/26/2014   Single vessel obstructive CAD  . CARPAL TUNNEL RELEASE  04-04-15   [facility]  . CATARACT EXTRACTION  07-31-11 and 09-18-11  . Catheter ablation  1991   for WPW  . cervical fusion    . CHOLECYSTECTOMY  09-07-14  . HAND SURGERY     right 1993; left 2005  . HERNIA REPAIR  1955  . INSERT / REPLACE / REMOVE PACEMAKER    . JOINT REPLACEMENT Left 2013   knee  . JOINT REPLACEMENT Right 2004   knee  . KNEE SURGERY  left knee 1991 and 1992; right knee 1995  . LEFT HEART CATHETERIZATION WITH CORONARY ANGIOGRAM N/A 08/26/2014   Procedure: LEFT HEART CATHETERIZATION WITH CORONARY ANGIOGRAM;  Surgeon: [name], MD;  Location[facility] C[facility] LAB;  Service: Cardiovascular;  Laterality: N/A;  . LUMBAR LAMINECTOMY/DECOMPRESSION MICRODISCECTOMY N/A 06/07/2014   Procedure: LUMBAR FOUR TO FIVE LUMBAR LAMINECTOMY/DECOMPRESSION MICRODISCECTOMY 1 LEVEL;  Surgeon: [name], MD;  Location: [place] NEURO ORS;  Service: Neurosurgery;  Laterality: N/A;  . LUNG BIOPSY Right 2016   Dr [name]  . PACEMAKER INSERTION     PPM-- St Jude 11/30/10 by [name]  . PROSTATE SURGERY     cancer--1998, prostatectomy  . REPLACEMENT TOTAL KNEE     2004  . ruptured disc     1962 and 1998  . TRIGGER FINGER RELEASE  01-24-15  . WOUND DEBRIDEMENT Left 06/07/2015   Procedure: DEBRIDEMENT  WOUND;  Surgeon: Robert Bellow, MD;  Location: [facility] ORS;  Service: General;  Laterality: Left;  left upper back    Current Outpatient Medications  Medication Sig Dispense Refill  . albuterol (PROVENTIL HFA;VENTOLIN HFA) 108 (90 Base) MCG/ACT inhaler Inhale 1-2 puffs into the lungs every 6 (six) hours as needed for wheezing or shortness of breath. 1 Inhaler 0  . aspirin 81 MG tablet Take 81 mg by mouth daily.      . chlorthalidone (HYGROTON) 25 MG tablet Take 1 tablet (25 mg total) by mouth daily. 90 tablet 3  . Cholecalciferol (VITAMIN D3) 1000 UNITS CAPS Take 1,000 Units by mouth daily.     Marland Kitchen ENTRESTO 97-103 MG TAKE 1 TABLET TWICE A DAY 180 tablet 3  . fluticasone (FLONASE) 50 MCG/ACT nasal spray Place 2 sprays into both nostrils daily.    . isosorbide mononitrate (IMDUR) 30 MG 24 hr tablet Take 1 tablet (30 mg total) by mouth daily. 90 tablet 3  . metFORMIN (GLUCOPHAGE) 500 MG tablet Take 1 tablet (500 mg total) by mouth 2 (two) times daily with a meal. 180 tablet 3  . metoprolol succinate (TOPROL-XL) 25 MG 24 hr tablet Take 1 tablet (25 mg) by mouth once daily at bedtime 90 tablet 3  . montelukast (SINGULAIR) 10 MG tablet Take 10 mg by mouth at bedtime.    . Multiple Vitamin (MULTIVITAMIN) capsule Take 1 capsule by mouth daily.      Marland Kitchen omeprazole (PRILOSEC) 40 MG capsule Take 1 capsule (40 mg total) by mouth 2 (two) times daily as needed. 180 capsule 3  . predniSONE (STERAPRED UNI-PAK 21 TAB) 10 MG (21) TBPK tablet Take by mouth daily.    . psyllium (METAMUCIL) 58.6 % powder Take 1 packet by mouth 3 (three) times daily.    . simvastatin (ZOCOR) 40 MG tablet TAKE 1 TABLET AT BEDTIME 90 tablet 3  . spironolactone (ALDACTONE) 50 MG tablet Take 1 tablet (50 mg) by mouth once daily in the morning 90 tablet 3   No current facility-administered medications for this visit.     Allergies  Allergen Reactions  . Sulfa Antibiotics Rash      Review of Systems negative except from HPI and PMH   Physical Exam BP 138/66 (BP Location: Left Arm, Patient Position: Sitting, Cuff Size: Normal)   Pulse 78   Ht 5' 9.5" (1.765 m)   Wt 190 lb (86.2 kg)   BMI 27.66 kg/m  Well developed and well nourished in no acute distress HENT normal Neck supple with JVP-flat Clear Device pocket well healed; without hematoma or erythema.  There is no tethering  Regular rate and rhythm, no  murmur Abd-soft with active BS No Clubbing cyanosis trace left greater than right edema Skin-warm and dry A & Oriented  Grossly normal sensory and motor function  ECG P synchronous pacing   Assessment and  Plan Nonischemic cardiomyopathy  Complete heart block  Pacemaker-Saint Jude The patient's device was interrogated.  The information was reviewed. No changes were made in the programming.   Device is reached ERI  Congestive heart failure class 3  Hypertension  Hyperkalemia   His device is reached ERI.  Given complete heart block and a Saint Jude device, we will undertake generator replacement with support of backup transvenous pacing.  I have reviewed this with him and his son.  We have reviewed the benefits and risks of generator replacement.  These include but are not limited to lead fracture and infection.  The patient understands, agrees and is willing to proceed.    He has a history of elevated potassium on high-dose Aldactone.  We will repeat his blood work and we may need alternative ways to manage blood pressure as opposed to the higher dose of Aldactone.  Euvolemic continue current meds  Low dose BB, this may be increased and he may do better with carvedilol in this regard Blood pressure at this point is well controlled   Current medicines are reviewed at length with the patient today .  The patient does not  have concerns regarding medicines.

## 2019-07-01 NOTE — H&P (View-Only) (Signed)
,      Patient Care Team: Chrismon, [name], PA as PCP - General (Family Medicine) [name] Kathlene November, MD as Consulting Physician (Cardiology) Dasher, [name], MD as Consulting Physician (Dermatology) Idelle Crouch [name], Student-RN as Student Nurse (Student) Birder Robson, MD as Referring Physician (Ophthalmology) [name], MD as Consulting Physician (Vascular Surgery) Deboraha Sprang, MD as Consulting Physician (Cardiology) Erby Pian, MD as Referring Physician (Specialist) Molli Barrows, MD as Consulting Physician (Anesthesiology)   HPI  Howard [name] is a 85 y.o. male Seen for pacemaker follow-up for device implanted for complete heart block 12/11.  His device is reached ERI  No changes in exercise tolerance.  Has had some edema of late.  Has been largely nonambulatory both because of COVID and because of chronic back pain.  He has been followed by Dr. Rayann Heman and [name].  He has nonobstructive coronary disease with a modest nonischemic cardiomyopathy  DATE TEST EF   8/15 Echo  35-40 %   5/16 Echo  45-50 %   6/19 Echo 35-40%   8/19 MYOVIEW 33%    Date Cr K Hgb  9/19 0.85 5.2 14.5   6/20 1.08     He is on high-dose Aldactone per Dr. Deidre Ala for blood pressure.  Records and Results Reviewed  Past Medical History:  Diagnosis Date  . Arthritis   . Bell palsy   . Cancer [facility])    prostate and skin  . Chronic combined systolic and diastolic CHF, NYHA class 1 ([place])    a. 07/2014 Echo: EF 35-40%, Gr 1 DD.  Marland Kitchen Complete heart block ([place])    a. 11/2010 s/p SJM 2210 Accent DC PPM, ser# [id number].  . Depression   . Diabetes mellitus without complication ([place])   . Fall 11-10-14  . GERD (gastroesophageal reflux disease)   . History of prostate cancer   . Hyperlipidemia   . Hypertension   . LBBB (left bundle branch block)   . Left-sided Bell's palsy   . Lung cancer ([place]) 2016  . NICM (nonischemic cardiomyopathy) ([place])    a. 07/2014 Echo: EF  35-40%, mid-apicalanteroseptal DK, Gr 1 DD, mild-mod dil LA.  . Non-obstructive CAD    a. 07/2014 Abnl MV;  b. 08/2014 Cath: LM nl, LAD 30p, RI 40p, LCX nl, OM1 40, RCA dominant 30p, 70d-->Med Rx.  Marland Kitchen Poor balance   . Presence of permanent cardiac pacemaker   . Sleep apnea    a. cpap  . Vertigo   . WPW (Wolff-Parkinson-White syndrome)    a. S/P RFCA 1991.    Past Surgical History:  Procedure Laterality Date  . APPLICATION OF WOUND VAC Left 06/07/2015   Procedure: APPLICATION OF WOUND VAC;  Surgeon: Robert Bellow, MD;  Location: [facility] ORS;  Service: General;  Laterality: Left;  left upper back  . BACK SURGERY     2011  . CARDIAC CATHETERIZATION  08/26/2014   Single vessel obstructive CAD  . CARPAL TUNNEL RELEASE  04-04-15   [facility]  . CATARACT EXTRACTION  07-31-11 and 09-18-11  . Catheter ablation  1991   for WPW  . cervical fusion    . CHOLECYSTECTOMY  09-07-14  . HAND SURGERY     right 1993; left 2005  . HERNIA REPAIR  1955  . INSERT / REPLACE / REMOVE PACEMAKER    . JOINT REPLACEMENT Left 2013   knee  . JOINT REPLACEMENT Right 2004   knee  . KNEE SURGERY  left knee 1991 and 1992; right knee 1995  . LEFT HEART CATHETERIZATION WITH CORONARY ANGIOGRAM N/A 08/26/2014   Procedure: LEFT HEART CATHETERIZATION WITH CORONARY ANGIOGRAM;  Surgeon: [name], MD;  Location[facility] C[facility] LAB;  Service: Cardiovascular;  Laterality: N/A;  . LUMBAR LAMINECTOMY/DECOMPRESSION MICRODISCECTOMY N/A 06/07/2014   Procedure: LUMBAR FOUR TO FIVE LUMBAR LAMINECTOMY/DECOMPRESSION MICRODISCECTOMY 1 LEVEL;  Surgeon: [name], MD;  Location: [place] NEURO ORS;  Service: Neurosurgery;  Laterality: N/A;  . LUNG BIOPSY Right 2016   Dr [name]  . PACEMAKER INSERTION     PPM-- St Jude 11/30/10 by [name]  . PROSTATE SURGERY     cancer--1998, prostatectomy  . REPLACEMENT TOTAL KNEE     2004  . ruptured disc     1962 and 1998  . TRIGGER FINGER RELEASE  01-24-15  . WOUND DEBRIDEMENT Left 06/07/2015   Procedure: DEBRIDEMENT  WOUND;  Surgeon: Robert Bellow, MD;  Location: [facility] ORS;  Service: General;  Laterality: Left;  left upper back    Current Outpatient Medications  Medication Sig Dispense Refill  . albuterol (PROVENTIL HFA;VENTOLIN HFA) 108 (90 Base) MCG/ACT inhaler Inhale 1-2 puffs into the lungs every 6 (six) hours as needed for wheezing or shortness of breath. 1 Inhaler 0  . aspirin 81 MG tablet Take 81 mg by mouth daily.      . chlorthalidone (HYGROTON) 25 MG tablet Take 1 tablet (25 mg total) by mouth daily. 90 tablet 3  . Cholecalciferol (VITAMIN D3) 1000 UNITS CAPS Take 1,000 Units by mouth daily.     Marland Kitchen ENTRESTO 97-103 MG TAKE 1 TABLET TWICE A DAY 180 tablet 3  . fluticasone (FLONASE) 50 MCG/ACT nasal spray Place 2 sprays into both nostrils daily.    . isosorbide mononitrate (IMDUR) 30 MG 24 hr tablet Take 1 tablet (30 mg total) by mouth daily. 90 tablet 3  . metFORMIN (GLUCOPHAGE) 500 MG tablet Take 1 tablet (500 mg total) by mouth 2 (two) times daily with a meal. 180 tablet 3  . metoprolol succinate (TOPROL-XL) 25 MG 24 hr tablet Take 1 tablet (25 mg) by mouth once daily at bedtime 90 tablet 3  . montelukast (SINGULAIR) 10 MG tablet Take 10 mg by mouth at bedtime.    . Multiple Vitamin (MULTIVITAMIN) capsule Take 1 capsule by mouth daily.      Marland Kitchen omeprazole (PRILOSEC) 40 MG capsule Take 1 capsule (40 mg total) by mouth 2 (two) times daily as needed. 180 capsule 3  . predniSONE (STERAPRED UNI-PAK 21 TAB) 10 MG (21) TBPK tablet Take by mouth daily.    . psyllium (METAMUCIL) 58.6 % powder Take 1 packet by mouth 3 (three) times daily.    . simvastatin (ZOCOR) 40 MG tablet TAKE 1 TABLET AT BEDTIME 90 tablet 3  . spironolactone (ALDACTONE) 50 MG tablet Take 1 tablet (50 mg) by mouth once daily in the morning 90 tablet 3   No current facility-administered medications for this visit.     Allergies  Allergen Reactions  . Sulfa Antibiotics Rash      Review of Systems negative except from HPI and PMH   Physical Exam BP 138/66 (BP Location: Left Arm, Patient Position: Sitting, Cuff Size: Normal)   Pulse 78   Ht 5' 9.5" (1.765 m)   Wt 190 lb (86.2 kg)   BMI 27.66 kg/m  Well developed and well nourished in no acute distress HENT normal Neck supple with JVP-flat Clear Device pocket well healed; without hematoma or erythema.  There is no tethering  Regular rate and rhythm, no  murmur Abd-soft with active BS No Clubbing cyanosis trace left greater than right edema Skin-warm and dry A & Oriented  Grossly normal sensory and motor function  ECG P synchronous pacing   Assessment and  Plan Nonischemic cardiomyopathy  Complete heart block  Pacemaker-Saint Jude The patient's device was interrogated.  The information was reviewed. No changes were made in the programming.   Device is reached ERI  Congestive heart failure class 3  Hypertension  Hyperkalemia   His device is reached ERI.  Given complete heart block and a Saint Jude device, we will undertake generator replacement with support of backup transvenous pacing.  I have reviewed this with him and his son.  We have reviewed the benefits and risks of generator replacement.  These include but are not limited to lead fracture and infection.  The patient understands, agrees and is willing to proceed.    He has a history of elevated potassium on high-dose Aldactone.  We will repeat his blood work and we may need alternative ways to manage blood pressure as opposed to the higher dose of Aldactone.  Euvolemic continue current meds  Low dose BB, this may be increased and he may do better with carvedilol in this regard Blood pressure at this point is well controlled   Current medicines are reviewed at length with the patient today .  The patient does not  have concerns regarding medicines.

## 2019-07-02 LAB — CBC WITH DIFFERENTIAL/PLATELET
Basophils Absolute: 0.1 10*3/uL (ref 0.0–0.2)
Basos: 1 %
EOS (ABSOLUTE): 0.2 10*3/uL (ref 0.0–0.4)
Eos: 2 %
Hematocrit: 43.7 % (ref 37.5–51.0)
Hemoglobin: 14 g/dL (ref 13.0–17.7)
Immature Grans (Abs): 0 10*3/uL (ref 0.0–0.1)
Immature Granulocytes: 0 %
Lymphocytes Absolute: 2 10*3/uL (ref 0.7–3.1)
Lymphs: 24 %
MCH: 27.5 pg (ref 26.6–33.0)
MCHC: 32 g/dL (ref 31.5–35.7)
MCV: 86 fL (ref 79–97)
Monocytes Absolute: 1 10*3/uL — ABNORMAL HIGH (ref 0.1–0.9)
Monocytes: 11 %
Neutrophils Absolute: 5.2 10*3/uL (ref 1.4–7.0)
Neutrophils: 62 %
Platelets: 297 10*3/uL (ref 150–450)
RBC: 5.09 x10E6/uL (ref 4.14–5.80)
RDW: 14.6 % (ref 11.6–15.4)
WBC: 8.4 10*3/uL (ref 3.4–10.8)

## 2019-07-02 LAB — BASIC METABOLIC PANEL
BUN/Creatinine Ratio: 27 — ABNORMAL HIGH (ref 10–24)
BUN: 28 mg/dL — ABNORMAL HIGH (ref 8–27)
CO2: 16 mmol/L — ABNORMAL LOW (ref 20–29)
Calcium: 9.9 mg/dL (ref 8.6–10.2)
Chloride: 98 mmol/L (ref 96–106)
Creatinine, Ser: 1.05 mg/dL (ref 0.76–1.27)
GFR calc Af Amer: 74 mL/min/{1.73_m2} (ref >59)
GFR calc non Af Amer: 64 mL/min/{1.73_m2} (ref >59)
Glucose: 123 mg/dL — ABNORMAL HIGH (ref 65–99)
Potassium: 4.9 mmol/L (ref 3.5–5.2)
Sodium: 134 mmol/L (ref 134–144)

## 2019-07-06 ENCOUNTER — Other Ambulatory Visit
Admission: RE | Admit: 2019-07-06 | Discharge: 2019-07-06 | Disposition: A | Discharge status: Home / Self Care | Payer: Medicare Other | Source: Ambulatory Visit | Attending: Internal Medicine | Admitting: Internal Medicine

## 2019-07-06 ENCOUNTER — Other Ambulatory Visit: Payer: Self-pay

## 2019-07-06 DIAGNOSIS — Z1159 Encounter for screening for other viral diseases: Secondary | ICD-10-CM | POA: Diagnosis not present

## 2019-07-07 ENCOUNTER — Ambulatory Visit: Payer: Medicare Other

## 2019-07-07 LAB — SARS CORONAVIRUS 2 (TAT 6-24 HRS): SARS Coronavirus 2: NEGATIVE

## 2019-07-09 ENCOUNTER — Ambulatory Visit (HOSPITAL_COMMUNITY)
Admission: RE | Admit: 2019-07-09 | Discharge: 2019-07-09 | Disposition: A | Discharge status: Home / Self Care | Payer: Medicare Other | Attending: Internal Medicine | Admitting: Internal Medicine

## 2019-07-09 ENCOUNTER — Other Ambulatory Visit: Payer: Self-pay

## 2019-07-09 ENCOUNTER — Ambulatory Visit (HOSPITAL_COMMUNITY)
Admission: RE | Disposition: A | Discharge status: Home / Self Care | Payer: Self-pay | Source: Home / Self Care | Attending: Internal Medicine

## 2019-07-09 ENCOUNTER — Encounter: Payer: Self-pay | Admitting: Anesthesiology

## 2019-07-09 DIAGNOSIS — Z96653 Presence of artificial knee joint, bilateral: Secondary | ICD-10-CM | POA: Diagnosis not present

## 2019-07-09 DIAGNOSIS — E119 Type 2 diabetes mellitus without complications: Secondary | ICD-10-CM | POA: Insufficient documentation

## 2019-07-09 DIAGNOSIS — Z7982 Long term (current) use of aspirin: Secondary | ICD-10-CM | POA: Insufficient documentation

## 2019-07-09 DIAGNOSIS — Z7984 Long term (current) use of oral hypoglycemic drugs: Secondary | ICD-10-CM | POA: Diagnosis not present

## 2019-07-09 DIAGNOSIS — E875 Hyperkalemia: Secondary | ICD-10-CM | POA: Insufficient documentation

## 2019-07-09 DIAGNOSIS — E785 Hyperlipidemia, unspecified: Secondary | ICD-10-CM | POA: Insufficient documentation

## 2019-07-09 DIAGNOSIS — I442 Atrioventricular block, complete: Secondary | ICD-10-CM | POA: Diagnosis not present

## 2019-07-09 DIAGNOSIS — I110 Hypertensive heart disease with heart failure: Secondary | ICD-10-CM | POA: Diagnosis not present

## 2019-07-09 DIAGNOSIS — Z79899 Other long term (current) drug therapy: Secondary | ICD-10-CM | POA: Insufficient documentation

## 2019-07-09 DIAGNOSIS — Z882 Allergy status to sulfonamides status: Secondary | ICD-10-CM | POA: Diagnosis not present

## 2019-07-09 DIAGNOSIS — Z9079 Acquired absence of other genital organ(s): Secondary | ICD-10-CM | POA: Insufficient documentation

## 2019-07-09 DIAGNOSIS — I5042 Chronic combined systolic (congestive) and diastolic (congestive) heart failure: Secondary | ICD-10-CM | POA: Diagnosis not present

## 2019-07-09 DIAGNOSIS — I428 Other cardiomyopathies: Secondary | ICD-10-CM | POA: Diagnosis not present

## 2019-07-09 DIAGNOSIS — G4730 Sleep apnea, unspecified: Secondary | ICD-10-CM | POA: Diagnosis not present

## 2019-07-09 DIAGNOSIS — M1990 Unspecified osteoarthritis, unspecified site: Secondary | ICD-10-CM | POA: Diagnosis not present

## 2019-07-09 DIAGNOSIS — I2510 Atherosclerotic heart disease of native coronary artery without angina pectoris: Secondary | ICD-10-CM | POA: Insufficient documentation

## 2019-07-09 DIAGNOSIS — Z45010 Encounter for checking and testing of cardiac pacemaker pulse generator [battery]: Secondary | ICD-10-CM | POA: Diagnosis not present

## 2019-07-09 DIAGNOSIS — K219 Gastro-esophageal reflux disease without esophagitis: Secondary | ICD-10-CM | POA: Diagnosis not present

## 2019-07-09 HISTORY — PX: TEMPORARY PACEMAKER: CATH118268

## 2019-07-09 HISTORY — PX: PPM GENERATOR CHANGEOUT: EP1233

## 2019-07-09 LAB — SURGICAL PCR SCREEN
MRSA, PCR: NEGATIVE
Staphylococcus aureus: NEGATIVE

## 2019-07-09 LAB — GLUCOSE, CAPILLARY: Glucose-Capillary: 151 mg/dL — ABNORMAL HIGH (ref 70–99)

## 2019-07-09 SURGERY — PPM GENERATOR CHANGEOUT

## 2019-07-09 MED ORDER — SODIUM CHLORIDE 0.9 % IV SOLN
INTRAVENOUS | Status: DC
  Administered 2019-07-09: 08:00:00 via INTRAVENOUS

## 2019-07-09 MED ORDER — SODIUM CHLORIDE 0.9 % IV SOLN: INTRAVENOUS | Status: DC

## 2019-07-09 MED ORDER — SODIUM CHLORIDE 0.9 % IV SOLN
80.0000 mg | INTRAVENOUS | Status: AC
  Administered 2019-07-09: 80 mg

## 2019-07-09 MED ORDER — SODIUM CHLORIDE 0.9 % IV SOLN: 80.0000 mg | INTRAVENOUS | Status: DC

## 2019-07-09 MED ORDER — CEFAZOLIN SODIUM-DEXTROSE 2-4 GM/100ML-% IV SOLN: 2.0000 g | INTRAVENOUS | Status: DC

## 2019-07-09 MED ORDER — LIDOCAINE HCL (PF) 1 % IJ SOLN
INTRAMUSCULAR | Status: DC | PRN
  Administered 2019-07-09: 10:00:00 45 mL

## 2019-07-09 MED ORDER — MUPIROCIN 2 % EX OINT
TOPICAL_OINTMENT | CUTANEOUS | Status: AC
  Filled 2019-07-09: qty 22

## 2019-07-09 MED ORDER — CHLORHEXIDINE GLUCONATE 4 % EX LIQD: 60.0000 mL | Freq: Once | CUTANEOUS | Status: DC

## 2019-07-09 MED ORDER — ONDANSETRON HCL 4 MG/2ML IJ SOLN: 4.0000 mg | Freq: Four times a day (QID) | INTRAMUSCULAR | Status: DC | PRN

## 2019-07-09 MED ORDER — CEFAZOLIN SODIUM-DEXTROSE 2-4 GM/100ML-% IV SOLN
INTRAVENOUS | Status: AC
  Filled 2019-07-09: qty 100

## 2019-07-09 MED ORDER — HEPARIN (PORCINE) IN NACL 1000-0.9 UT/500ML-% IV SOLN
INTRAVENOUS | Status: DC | PRN
  Administered 2019-07-09 (×2): 500 mL

## 2019-07-09 MED ORDER — ACETAMINOPHEN 325 MG PO TABS: 325.0000 mg | ORAL_TABLET | ORAL | Status: DC | PRN

## 2019-07-09 MED ORDER — HEPARIN (PORCINE) IN NACL 1000-0.9 UT/500ML-% IV SOLN
INTRAVENOUS | Status: AC
  Filled 2019-07-09: qty 1000

## 2019-07-09 MED ORDER — BUPIVACAINE HCL (PF) 0.25 % IJ SOLN
INTRAMUSCULAR | Status: DC | PRN
  Administered 2019-07-09: 20 mL

## 2019-07-09 MED ORDER — LIDOCAINE HCL 1 % IJ SOLN
INTRAMUSCULAR | Status: AC
  Filled 2019-07-09: qty 60

## 2019-07-09 MED ORDER — MUPIROCIN 2 % EX OINT
TOPICAL_OINTMENT | CUTANEOUS | Status: AC
  Administered 2019-07-09: 08:00:00
  Filled 2019-07-09: qty 22

## 2019-07-09 MED ORDER — CEFAZOLIN SODIUM-DEXTROSE 2-4 GM/100ML-% IV SOLN
2.0000 g | INTRAVENOUS | Status: AC
  Administered 2019-07-09: 2 g via INTRAVENOUS

## 2019-07-09 MED ORDER — BUPIVACAINE HCL (PF) 0.25 % IJ SOLN
INTRAMUSCULAR | Status: AC
  Filled 2019-07-09: qty 30

## 2019-07-09 MED ORDER — SODIUM CHLORIDE 0.9 % IV SOLN
INTRAVENOUS | Status: AC
  Filled 2019-07-09: qty 2

## 2019-07-09 SURGICAL SUPPLY — 8 items
CABLE ADAPT PACING TEMP 12FT (ADAPTER) IMPLANT
CABLE SURGICAL S-101-97-12 (CABLE) ×2 IMPLANT
CATH JOSEPH QUAD ALLRED 6F REP (CATHETERS) ×1 IMPLANT
HEMOSTAT SURGICEL 2X4 FIBR (HEMOSTASIS) ×1 IMPLANT
PACEMAKER ASSURITY DR-RF (Pacemaker) ×1 IMPLANT
PAD PRO RADIOLUCENT 2001M-C (PAD) ×2 IMPLANT
SHEATH PINNACLE 6F 10CM (SHEATH) ×1 IMPLANT
TRAY PACEMAKER INSERTION (PACKS) ×2 IMPLANT

## 2019-07-09 NOTE — Progress Notes (Signed)
Discharge time given to pt son 65 after speaking with Dr Caryl Comes

## 2019-07-09 NOTE — Discharge Instructions (Signed)
Groin site care/activity instructions No driving for 4 days. No lifting over 5 lbs for 1 week. No vigorous or sexual activity for 1 week. Keep procedure site clean & dry. If you notice increased pain, swelling, bleeding or pus, call/return!  You may shower, but no soaking baths/hot tubs/pools for 1 week.      Pacemaker Battery Change, Care After This sheet gives you information about how to care for yourself after your procedure. Your health care provider may also give you more specific instructions. If you have problems or questions, contact your health care provider. What can I expect after the procedure? After your procedure, it is common to have:  Pain or soreness at the site where the pacemaker was inserted.  Swelling at the site where the pacemaker was inserted. Follow these instructions at home: Incision care   Keep the incision clean and dry. ? Do not take baths, swim, or use a hot tub until your health care provider approves. ? You may shower the day after your procedure, or as directed by your health care provider. ? Pat the area dry with a clean towel. Do not rub the area. This may cause bleeding.  Follow instructions from your health care provider about how to take care of your incision. Make sure you: ? Wash your hands with soap and water before you change your bandage (dressing). If soap and water are not available, use hand sanitizer. ? Change your dressing as told by your health care provider. ? Leave stitches (sutures), skin glue, or adhesive strips in place. These skin closures may need to stay in place for 2 weeks or longer. If adhesive strip edges start to loosen and curl up, you may trim the loose edges. Do not remove adhesive strips completely unless your health care provider tells you to do that.  Check your incision area every day for signs of infection. Check for: ? More redness, swelling, or pain. ? More fluid or blood. ? Warmth. ? Pus or a bad  smell. Activity  Do not lift anything that is heavier than 10 lb (4.5 kg) until your health care provider says it is okay to do so.  For the first 2 weeks, or as long as told by your health care provider: ? Avoid lifting your left arm higher than your shoulder. ? Be gentle when you move your arms over your head. It is okay to raise your arm to comb your hair. ? Avoid strenuous exercise.  Ask your health care provider when it is okay to: ? Resume your normal activities. ? Return to work or school. ? Resume sexual activity. Eating and drinking  Eat a heart-healthy diet. This should include plenty of fresh fruits and vegetables, whole grains, low-fat dairy products, and lean protein like chicken and fish.  Limit alcohol intake to no more than 1 drink a day for non-pregnant women and 2 drinks a day for men. One drink equals 12 oz of beer, 5 oz of wine, or 1 oz of hard liquor.  Check ingredients and nutrition facts on packaged foods and beverages. Avoid the following types of food: ? Food that is high in salt (sodium). ? Food that is high in saturated fat, like full-fat dairy or red meat. ? Food that is high in trans fat, like fried food. ? Food and drinks that are high in sugar. Lifestyle  Do not use any products that contain nicotine or tobacco, such as cigarettes and e-cigarettes. If you need help quitting,  ask your health care provider.  Take steps to manage and control your weight.  Get regular exercise. Aim for 150 minutes of moderate-intensity exercise (such as walking or yoga) or 75 minutes of vigorous exercise (such as running or swimming) each week.  Manage other health problems, such as diabetes or high blood pressure. Ask your health care provider how you can manage these conditions. General instructions  Do not drive for 24 hours after your procedure if you were given a medicine to help you relax (sedative).  Take over-the-counter and prescription medicines only as told  by your health care provider.  Avoid putting pressure on the area where the pacemaker was placed.  If you need an MRI after your pacemaker has been placed, be sure to tell the health care provider who orders the MRI that you have a pacemaker.  Avoid close and prolonged exposure to electrical devices that have strong magnetic fields. These include: ? Cell phones. Avoid keeping them in a pocket near the pacemaker, and try using the ear opposite the pacemaker. ? MP3 players. ? Household appliances, like microwaves. ? Metal detectors. ? Electric generators. ? High-tension wires.  Keep all follow-up visits as directed by your health care provider. This is important. Contact a health care provider if:  You have pain at the incision site that is not relieved by over-the-counter or prescription medicines.  You have any of these around your incision site or coming from it: ? More redness, swelling, or pain. ? Fluid or blood. ? Warmth to the touch. ? Pus or a bad smell.  You have a fever.  You feel brief, occasional palpitations, light-headedness, or any symptoms that you think might be related to your heart. Get help right away if:  You experience chest pain that is different from the pain at the pacemaker site.  You develop a red streak that extends above or below the incision site.  You experience shortness of breath.  You have palpitations or an irregular heartbeat.  You have light-headedness that does not go away quickly.  You faint or have dizzy spells.  Your pulse suddenly drops or increases rapidly and does not return to normal.  You begin to gain weight and your legs and ankles swell. Summary  After your procedure, it is common to have pain, soreness, and some swelling where the pacemaker was inserted.  Make sure to keep your incision clean and dry. Follow instructions from your health care provider about how to take care of your incision.  Check your incision every  day for signs of infection, such as more pain or swelling, pus or a bad smell, warmth, or leaking fluid and blood.  Avoid strenuous exercise and lifting your left arm higher than your shoulder for 2 weeks, or as long as told by your health care provider. This information is not intended to replace advice given to you by your health care provider. Make sure you discuss any questions you have with your health care provider. Document Released: 09/29/2013 Document Revised: 11/21/2017 Document Reviewed: 10/31/2016 Elsevier Patient Education  2020 [employer].

## 2019-07-09 NOTE — Interval H&P Note (Signed)
History and Physical Interval Note:  07/09/2019 9:13 AM  Howard [name]  has presented today for surgery, with the diagnosis of ERI.  The various methods of treatment have been discussed with the patient and family. After consideration of risks, benefits and other options for treatment, the patient has consented to  Procedure(s): [place] (N/A) TEMPORARY PACEMAKER (N/A) as a surgical intervention.  The patient's history has been reviewed, patient examined, no change in status, stable for surgery.  I have reviewed the patient's chart and labs.  Questions were answered to the patient's satisfaction.     [name]

## 2019-07-09 NOTE — Progress Notes (Signed)
Ambulated in room tol well

## 2019-07-09 NOTE — Progress Notes (Signed)
[facility] rep called states they do not need to see pt he can be discharged when ready.

## 2019-07-09 NOTE — Progress Notes (Signed)
Remote pacemaker transmission.   

## 2019-07-12 ENCOUNTER — Encounter (HOSPITAL_COMMUNITY): Payer: Self-pay | Admitting: Internal Medicine

## 2019-07-12 MED FILL — Cefazolin Sodium-Dextrose IV Solution 2 GM/100ML-4%: INTRAVENOUS | Qty: 100 | Status: AC

## 2019-07-12 MED FILL — Gentamicin Sulfate Inj 40 MG/ML: INTRAMUSCULAR | Qty: 80 | Status: AC

## 2019-07-12 MED FILL — Lidocaine HCl Local Inj 1%: INTRAMUSCULAR | Qty: 60 | Status: AC

## 2019-07-12 NOTE — Telephone Encounter (Signed)
Dr. Andree Elk told patient he wanted to call in pain patch but did not say what one. Dr. Andree Elk was talking to his son.  Please call Dr. Andree Elk and find out if he is going to call in pain patch. [facility] will need [facility] go to [facility].

## 2019-07-13 DIAGNOSIS — L720 Epidermal cyst: Secondary | ICD-10-CM | POA: Diagnosis not present

## 2019-07-14 ENCOUNTER — Telehealth: Payer: Self-pay

## 2019-07-14 ENCOUNTER — Ambulatory Visit: Payer: Medicare Other | Admitting: Radiation Oncology

## 2019-07-14 MED ORDER — FENTANYL 12 MCG/HR TD PT72: 1.0000 | MEDICATED_PATCH | TRANSDERMAL | 0 refills | Status: DC

## 2019-07-14 NOTE — Telephone Encounter (Signed)
Called and spoke to Howard [name] that patch was sent to his drug store.

## 2019-07-14 NOTE — Addendum Note (Signed)
Addended by: Molli Barrows on: 07/14/2019 02:59 PM   Modules accepted: Orders

## 2019-07-19 ENCOUNTER — Telehealth: Payer: Self-pay

## 2019-07-19 NOTE — Telephone Encounter (Signed)
    COVID-19 Pre-Screening Questions:  . In the past 7 to 10 days have you had a cough,  shortness of breath, headache, congestion, fever (100 or greater) body aches, chills, sore throat, or sudden loss of taste or sense of smell? No . Have you been around anyone with known Covid 19. No . Have you been around anyone who is awaiting Covid 19 test results in the past 7 to 10 days? No . Have you been around anyone who has been exposed to Covid 19, or has mentioned symptoms of Covid 19 within the past 7 to 10 days? No  If you have any concerns/questions about symptoms patients report during screening (either on the phone or at threshold). Contact the provider seeing the patient or DOD for further guidance.  If neither are available contact a member of the leadership team.    Pt wife answered no to all covid-19 prescreening questions. I asked the pt wife to make sure he wear a mask to his appointment. I also let the pt wife know we are reducing the number of people coming into the office and if he can physically come alone to please do so. Pt wife states the pt will need a wheel chair for his visit. I told her 1 person can come with the pt and that person will have to wear a mask as well. I told her if anything changes between now and his appointment time to please call and let me know. The pt wife verbalized understanding.

## 2019-07-20 ENCOUNTER — Other Ambulatory Visit: Payer: Self-pay

## 2019-07-20 ENCOUNTER — Ambulatory Visit (INDEPENDENT_AMBULATORY_CARE_PROVIDER_SITE_OTHER): Payer: Medicare Other | Admitting: *Deleted

## 2019-07-20 DIAGNOSIS — I442 Atrioventricular block, complete: Secondary | ICD-10-CM | POA: Diagnosis not present

## 2019-07-20 LAB — CUP PACEART INCLINIC DEVICE CHECK
Battery Remaining Longevity: 128 mo
Battery Voltage: 3.11 V
Brady Statistic RA Percent Paced: 0.19 %
Brady Statistic RV Percent Paced: 99.98 %
Date Time Interrogation Session: 20200728095512
Implantable Lead Implant Date: 20111208
Implantable Lead Implant Date: 20111208
Implantable Lead Location: 753859
Implantable Lead Location: 753860
Implantable Lead Model: 1948
Implantable Pulse Generator Implant Date: 20200717
Lead Channel Impedance Value: 425 Ohm
Lead Channel Impedance Value: 487.5 Ohm
Lead Channel Pacing Threshold Amplitude: 0.625 V
Lead Channel Pacing Threshold Amplitude: 0.75 V
Lead Channel Pacing Threshold Amplitude: 0.75 V
Lead Channel Pacing Threshold Pulse Width: 0.5 ms
Lead Channel Pacing Threshold Pulse Width: 0.5 ms
Lead Channel Pacing Threshold Pulse Width: 0.5 ms
Lead Channel Sensing Intrinsic Amplitude: 4.4 mV
Lead Channel Setting Pacing Amplitude: 0.875
Lead Channel Setting Pacing Amplitude: 2 V
Lead Channel Setting Pacing Pulse Width: 0.5 ms
Lead Channel Setting Sensing Sensitivity: 4 mV
Pulse Gen Model: 2272
Pulse Gen Serial Number: 9150507

## 2019-07-20 NOTE — Progress Notes (Signed)
Wound check appointment. Dermabond removed. Wound without redness or edema. Incision edges approximated, wound well healed. Normal device function. Thresholds, sensing, and impedances consistent with implant measurements. Device programmed at chronic outputs. Histogram distribution appropriate for patient and level of activity. No mode switches or high ventricular rates noted. Patient educated about wound care, arm mobility, lifting restrictions. ROV in 3 months w/ Dr. Caryl Comes

## 2019-07-28 ENCOUNTER — Telehealth: Payer: Self-pay | Admitting: Anesthesiology

## 2019-07-28 NOTE — Telephone Encounter (Signed)
Spoke with Mrs Plant and they only received 7 days worth and will need another Rx to continue therapy.  He is currently wearing a patch that is due to be changed tomorrow.  I told her I would let Dr Andree Elk know. She will check with pharmacy tomorrow.

## 2019-07-28 NOTE — Telephone Encounter (Signed)
Mrs. Rendleman calling about a script for fentanyl patch. Pharmacy says they could only give 7 day supply because he has never had this before. He will need a new script and a phone call about this medication to [facility] [phone]  Please call Insurance to get PA . Patient picked up script 07-14-19. Any questions call patient.

## 2019-07-31 ENCOUNTER — Other Ambulatory Visit: Payer: Self-pay | Admitting: Cardiovascular Disease

## 2019-08-02 ENCOUNTER — Other Ambulatory Visit: Payer: Self-pay

## 2019-08-02 ENCOUNTER — Ambulatory Visit: Payer: Medicare Other | Attending: Anesthesiology | Admitting: Anesthesiology

## 2019-08-02 ENCOUNTER — Encounter: Payer: Self-pay | Admitting: Anesthesiology

## 2019-08-02 DIAGNOSIS — M545 Low back pain, unspecified: Secondary | ICD-10-CM

## 2019-08-02 DIAGNOSIS — M5432 Sciatica, left side: Secondary | ICD-10-CM

## 2019-08-02 DIAGNOSIS — M961 Postlaminectomy syndrome, not elsewhere classified: Secondary | ICD-10-CM

## 2019-08-02 DIAGNOSIS — G8929 Other chronic pain: Secondary | ICD-10-CM

## 2019-08-02 DIAGNOSIS — M48062 Spinal stenosis, lumbar region with neurogenic claudication: Secondary | ICD-10-CM

## 2019-08-02 DIAGNOSIS — R29898 Other symptoms and signs involving the musculoskeletal system: Secondary | ICD-10-CM

## 2019-08-02 DIAGNOSIS — M5136 Other intervertebral disc degeneration, lumbar region: Secondary | ICD-10-CM

## 2019-08-02 MED ORDER — FENTANYL 12 MCG/HR TD PT72: 1.0000 | MEDICATED_PATCH | TRANSDERMAL | 0 refills | Status: DC

## 2019-08-02 NOTE — Telephone Encounter (Signed)
Patient has virtual visit appt 08-02-2019 with Dr. Andree Elk.

## 2019-08-03 NOTE — Progress Notes (Signed)
Subjective:  Patient ID: Howard [name], male    DOB: [birth year]  Age: 85 y.o. MRN: [id number]  CC: No chief complaint on file.   Procedure: None  HPI Howard [name] presents for reevaluation.  He continues to have back pain but has been doing better with his Duragesic patch.  This is at the 12.5 mics strength.  He has not had any significant side effects with this and is able to ambulate better sleep better at night as well.  He is having some swelling in his left lower extremity and is concerned with associated numbness at the base of the foot.  This is relatively new.  The pain that he once had at the base of the foot has now abated with the associated numbness setting in.  He is ambulating with no complaints of new motor strength changes.  Bowel and bladder function been stable as well.  Outpatient Medications Prior to Visit  Medication Sig Dispense Refill  . albuterol (PROVENTIL HFA;VENTOLIN HFA) 108 (90 Base) MCG/ACT inhaler Inhale 1-2 puffs into the lungs every 6 (six) hours as needed for wheezing or shortness of breath. 1 Inhaler 0  . aspirin EC 81 MG tablet Take 81 mg by mouth daily.    . chlorthalidone (HYGROTON) 25 MG tablet Take 1 tablet (25 mg total) by mouth daily. 90 tablet 3  . Cholecalciferol (VITAMIN D3) 1000 UNITS CAPS Take 1,000 Units by mouth daily.     Marland Kitchen ENTRESTO 97-103 MG TAKE 1 TABLET TWICE A DAY (Patient taking differently: Take 1 tablet by mouth 2 (two) times a day. ) 180 tablet 3  . fluticasone (FLONASE) 50 MCG/ACT nasal spray Place 2 sprays into both nostrils daily.    . isosorbide mononitrate (IMDUR) 30 MG 24 hr tablet TAKE 1 TABLET BY MOUTH DAILY 90 tablet 1  . metFORMIN (GLUCOPHAGE) 500 MG tablet Take 1 tablet (500 mg total) by mouth 2 (two) times daily with a meal. 180 tablet 3  . methylPREDNISolone (MEDROL DOSEPAK) 4 MG TBPK tablet Take 4-24 mg by mouth See admin instructions. Take 6-5-4-3-2-1 on consecutive days    . metoprolol succinate (TOPROL-XL) 25 MG 24  hr tablet Take 1 tablet (25 mg) by mouth once daily at bedtime (Patient taking differently: Take 25 mg by mouth at bedtime. ) 90 tablet 3  . montelukast (SINGULAIR) 10 MG tablet Take 10 mg by mouth daily as needed (sneezing/allergies.).     Marland Kitchen Multiple Vitamin (MULTIVITAMIN WITH MINERALS) TABS tablet Take 1 tablet by mouth daily. One-A-Day Multivitamin    . omeprazole (PRILOSEC) 40 MG capsule Take 1 capsule (40 mg total) by mouth 2 (two) times daily as needed. (Patient taking differently: Take 40 mg by mouth 2 (two) times a day. ) 180 capsule 3  . psyllium (METAMUCIL) 58.6 % powder Take 1 packet by mouth at bedtime.     . simvastatin (ZOCOR) 40 MG tablet TAKE 1 TABLET AT BEDTIME (Patient taking differently: Take 40 mg by mouth at bedtime. ) 90 tablet 3  . spironolactone (ALDACTONE) 50 MG tablet Take 1 tablet (50 mg) by mouth once daily in the morning (Patient taking differently: Take 50 mg by mouth every morning. ) 90 tablet 3  . fentaNYL (DURAGESIC) 12 MCG/HR Place 1 patch onto the skin every 3 (three) days. 10 patch 0   No facility-administered medications prior to visit.     Review of Systems CNS: No confusion or sedation Cardiac: No angina or palpitations GI: No abdominal pain  or constipation Constitutional: No nausea vomiting fevers or chills  Objective:  There were no vitals taken for this visit.   BP Readings from Last 3 Encounters:  07/09/19 (!) 105/53  07/01/19 138/66  06/18/19 (!) 148/67     Wt Readings from Last 3 Encounters:  07/09/19 190 lb (86.2 kg)  07/01/19 190 lb (86.2 kg)  06/18/19 195 lb 5.2 oz (88.6 kg)     Physical Exam Pt is alert and oriented PERRL EOMI HEART IS RRR no murmur or rub LCTA no wheezing or rales MUSCULOSKELETAL reveals some mild swelling around the left foot and ankle region and up the lower one third of the pretibial region.  There is no change in temperature left foot versus right foot.  No color changes are appreciated between the left  and right foot either.  He has good range of motion and strength with flexion extension at the ankle is 5/5.  And consistent with the right side.  No pseudomotor changes are noted either.  Labs  Lab Results  Component Value Date   HGBA1C 7.7 (A) 11/27/2018   HGBA1C 7.6 (A) 08/25/2018   HGBA1C 7.6 (H) 05/05/2018   Lab Results  Component Value Date   MICROALBUR 100 11/27/2018   LDLCALC 62 08/25/2018   CREATININE 1.05 07/01/2019    -------------------------------------------------------------------------------------------------------------------- Lab Results  Component Value Date   WBC 8.4 07/01/2019   HGB 14.0 07/01/2019   HCT 43.7 07/01/2019   PLT 297 07/01/2019   GLUCOSE 123 (H) 07/01/2019   CHOL 138 08/25/2018   TRIG 152 (H) 08/25/2018   HDL 46 08/25/2018   LDLCALC 62 08/25/2018   ALT 19 08/25/2018   AST 21 08/25/2018   NA 134 07/01/2019   K 4.9 07/01/2019   CL 98 07/01/2019   CREATININE 1.05 07/01/2019   BUN 28 (H) 07/01/2019   CO2 16 (L) 07/01/2019   TSH 1.650 05/05/2018   INR 0.94 10/26/2015   HGBA1C 7.7 (A) 11/27/2018   MICROALBUR 100 11/27/2018    --------------------------------------------------------------------------------------------------------------------- No results found.   Assessment & Plan:   Diagnoses and all orders for this visit:  Lumbar post-laminectomy syndrome  DDD (degenerative disc disease), lumbar  Sciatica of left side  Chronic left-sided low back pain without sciatica  Spinal stenosis, lumbar region, with neurogenic claudication  Weakness of left leg  Other orders -     fentaNYL (DURAGESIC) 12 MCG/HR; Place 1 patch onto the skin every 3 (three) days.        ----------------------------------------------------------------------------------------------------------------------  Problem List Items Addressed This Visit      Unprioritized   Spinal stenosis, lumbar region, with neurogenic claudication   Relevant  Medications   fentaNYL (DURAGESIC) 12 MCG/HR    Other Visit Diagnoses    Lumbar post-laminectomy syndrome    -  Primary   DDD (degenerative disc disease), lumbar       Relevant Medications   fentaNYL (DURAGESIC) 12 MCG/HR   Sciatica of left side       Chronic left-sided low back pain without sciatica       Relevant Medications   fentaNYL (DURAGESIC) 12 MCG/HR   Weakness of left leg            ----------------------------------------------------------------------------------------------------------------------  1. Lumbar post-laminectomy syndrome At this point we will defer on any repeat injections.  He has had marginal relief with caudal injection and has failed conservative therapy.  He has responded favorably to the Duragesic patch and we will refill this today.  I have  reviewed the [facility] practitioner database information and it is appropriate.  In regard to the left lower extremity swelling I have requested that he follow-up with his primary care physician to further identify other potential sources but I do not believe this is nerve root compression related.  2. DDD (degenerative disc disease), lumbar Continue efforts at stretching and strengthening as tolerated  3. Sciatica of left side As above  4. Chronic left-sided low back pain without sciatica   5. Spinal stenosis, lumbar region, with neurogenic claudication Continue medication therapy  6. Weakness of left leg     ----------------------------------------------------------------------------------------------------------------------  I am having Howard [name] maintain his Vitamin D3, psyllium, albuterol, simvastatin, fluticasone, montelukast, spironolactone, chlorthalidone, metoprolol succinate, metFORMIN, omeprazole, Entresto, aspirin EC, multivitamin with minerals, methylPREDNISolone, isosorbide mononitrate, and fentaNYL.   Meds ordered this encounter  Medications  . fentaNYL (DURAGESIC) 12 MCG/HR     Sig: Place 1 patch onto the skin every 3 (three) days.    Dispense:  10 patch    Refill:  0   Patient's Medications  New Prescriptions   No medications on file  Previous Medications   ALBUTEROL (PROVENTIL HFA;VENTOLIN HFA) 108 (90 BASE) MCG/ACT INHALER    Inhale 1-2 puffs into the lungs every 6 (six) hours as needed for wheezing or shortness of breath.   ASPIRIN EC 81 MG TABLET    Take 81 mg by mouth daily.   CHLORTHALIDONE (HYGROTON) 25 MG TABLET    Take 1 tablet (25 mg total) by mouth daily.   CHOLECALCIFEROL (VITAMIN D3) 1000 UNITS CAPS    Take 1,000 Units by mouth daily.    ENTRESTO 97-103 MG    TAKE 1 TABLET TWICE A DAY   FLUTICASONE (FLONASE) 50 MCG/ACT NASAL SPRAY    Place 2 sprays into both nostrils daily.   ISOSORBIDE MONONITRATE (IMDUR) 30 MG 24 HR TABLET    TAKE 1 TABLET BY MOUTH DAILY   METFORMIN (GLUCOPHAGE) 500 MG TABLET    Take 1 tablet (500 mg total) by mouth 2 (two) times daily with a meal.   METHYLPREDNISOLONE (MEDROL DOSEPAK) 4 MG TBPK TABLET    Take 4-24 mg by mouth See admin instructions. Take 6-5-4-3-2-1 on consecutive days   METOPROLOL SUCCINATE (TOPROL-XL) 25 MG 24 HR TABLET    Take 1 tablet (25 mg) by mouth once daily at bedtime   MONTELUKAST (SINGULAIR) 10 MG TABLET    Take 10 mg by mouth daily as needed (sneezing/allergies.).    MULTIPLE VITAMIN (MULTIVITAMIN WITH MINERALS) TABS TABLET    Take 1 tablet by mouth daily. One-A-Day Multivitamin   OMEPRAZOLE (PRILOSEC) 40 MG CAPSULE    Take 1 capsule (40 mg total) by mouth 2 (two) times daily as needed.   PSYLLIUM (METAMUCIL) 58.6 % POWDER    Take 1 packet by mouth at bedtime.    SIMVASTATIN (ZOCOR) 40 MG TABLET    TAKE 1 TABLET AT BEDTIME   SPIRONOLACTONE (ALDACTONE) 50 MG TABLET    Take 1 tablet (50 mg) by mouth once daily in the morning  Modified Medications   Modified Medication Previous Medication   FENTANYL (DURAGESIC) 12 MCG/HR fentaNYL (DURAGESIC) 12 MCG/HR      Place 1 patch onto the skin every 3 (three)  days.    Place 1 patch onto the skin every 3 (three) days.  Discontinued Medications   No medications on file   ----------------------------------------------------------------------------------------------------------------------  Follow-up: Return in about 1 month (around 09/02/2019) for evaluation, med refill.  Molli Barrows, MD

## 2019-08-04 ENCOUNTER — Other Ambulatory Visit: Payer: Self-pay

## 2019-08-04 ENCOUNTER — Encounter: Payer: Self-pay | Admitting: Physician Assistant

## 2019-08-04 ENCOUNTER — Ambulatory Visit (INDEPENDENT_AMBULATORY_CARE_PROVIDER_SITE_OTHER): Payer: Medicare Other | Admitting: Physician Assistant

## 2019-08-04 VITALS — BP 104/50 | HR 69 | Temp 98.4°F | Resp 16 | Wt 179.8 lb

## 2019-08-04 DIAGNOSIS — R200 Anesthesia of skin: Secondary | ICD-10-CM

## 2019-08-04 DIAGNOSIS — I739 Peripheral vascular disease, unspecified: Secondary | ICD-10-CM

## 2019-08-04 NOTE — Progress Notes (Signed)
Patient: Howard [name] Male    DOB: 1931-10-07   85 y.o.   MRN: [id number] Visit Date: 08/04/2019  Today's Provider: Trinna Post, PA-C   Chief Complaint  Patient presents with  . Numbness   Subjective:     HPI  Patient is an 85 y.o man with history of CVD, HTN, pace maker due to complete AV block, spinal stenosis s/p laminectomy who comes in office today with complaint of numbness and pain in his left foot that has been occurring for the past two weeks. Patient reports that for the past several months he had suffered with cramping in his lower left leg that radiated to his foot. The cramping starts in his thigh and travels down to his foot. It will start after walking 100 ft and improve with rest. He does not have pain in his left leg at rest. His right leg does not have symptoms. In the past two weeks he reports that he noticed swelling in his lower extremity, change to color of skin and numbness in his foot.   Allergies  Allergen Reactions  . Sulfa Antibiotics Rash     Current Outpatient Medications:  .  albuterol (PROVENTIL HFA;VENTOLIN HFA) 108 (90 Base) MCG/ACT inhaler, Inhale 1-2 puffs into the lungs every 6 (six) hours as needed for wheezing or shortness of breath., Disp: 1 Inhaler, Rfl: 0 .  aspirin EC 81 MG tablet, Take 81 mg by mouth daily., Disp: , Rfl:  .  Cholecalciferol (VITAMIN D3) 1000 UNITS CAPS, Take 1,000 Units by mouth daily. , Disp: , Rfl:  .  ENTRESTO 97-103 MG, TAKE 1 TABLET TWICE A DAY (Patient taking differently: Take 1 tablet by mouth 2 (two) times a day. ), Disp: 180 tablet, Rfl: 3 .  fentaNYL (DURAGESIC) 12 MCG/HR, Place 1 patch onto the skin every 3 (three) days., Disp: 10 patch, Rfl: 0 .  fluticasone (FLONASE) 50 MCG/ACT nasal spray, Place 2 sprays into both nostrils daily., Disp: , Rfl:  .  isosorbide mononitrate (IMDUR) 30 MG 24 hr tablet, TAKE 1 TABLET BY MOUTH DAILY, Disp: 90 tablet, Rfl: 1 .  metFORMIN (GLUCOPHAGE) 500 MG tablet, Take 1  tablet (500 mg total) by mouth 2 (two) times daily with a meal., Disp: 180 tablet, Rfl: 3 .  methylPREDNISolone (MEDROL DOSEPAK) 4 MG TBPK tablet, Take 4-24 mg by mouth See admin instructions. Take 6-5-4-3-2-1 on consecutive days, Disp: , Rfl:  .  metoprolol succinate (TOPROL-XL) 25 MG 24 hr tablet, Take 1 tablet (25 mg) by mouth once daily at bedtime (Patient taking differently: Take 25 mg by mouth at bedtime. ), Disp: 90 tablet, Rfl: 3 .  montelukast (SINGULAIR) 10 MG tablet, Take 10 mg by mouth daily as needed (sneezing/allergies.). , Disp: , Rfl:  .  Multiple Vitamin (MULTIVITAMIN WITH MINERALS) TABS tablet, Take 1 tablet by mouth daily. One-A-Day Multivitamin, Disp: , Rfl:  .  omeprazole (PRILOSEC) 40 MG capsule, Take 1 capsule (40 mg total) by mouth 2 (two) times daily as needed. (Patient taking differently: Take 40 mg by mouth 2 (two) times a day. ), Disp: 180 capsule, Rfl: 3 .  psyllium (METAMUCIL) 58.6 % powder, Take 1 packet by mouth at bedtime. , Disp: , Rfl:  .  simvastatin (ZOCOR) 40 MG tablet, TAKE 1 TABLET AT BEDTIME (Patient taking differently: Take 40 mg by mouth at bedtime. ), Disp: 90 tablet, Rfl: 3 .  spironolactone (ALDACTONE) 50 MG tablet, Take 1 tablet (50 mg)  by mouth once daily in the morning (Patient taking differently: Take 50 mg by mouth every morning. ), Disp: 90 tablet, Rfl: 3 .  chlorthalidone (HYGROTON) 25 MG tablet, Take 1 tablet (25 mg total) by mouth daily., Disp: 90 tablet, Rfl: 3  Review of Systems  Constitutional: Negative.   HENT: Negative.   Gastrointestinal: Negative.   Endocrine: Positive for cold intolerance.  Musculoskeletal: Positive for arthralgias and joint swelling.  Skin: Positive for color change.  Neurological: Positive for numbness. Negative for dizziness, light-headedness and headaches.    Social History   Tobacco Use  . Smoking status: Former Smoker    Years: 4.00  . Smokeless tobacco: Never Used  . Tobacco comment: Quit 2011    Substance Use Topics  . Alcohol use: No    Frequency: Never      Objective:   BP (!) 104/50   Pulse 69   Temp 98.4 F (36.9 C) (Oral)   Resp 16   Wt 179 lb 12.8 oz (81.6 kg)   SpO2 98%   BMI 26.17 kg/m  Vitals:   08/04/19 1042  BP: (!) 104/50  Pulse: 69  Resp: 16  Temp: 98.4 F (36.9 C)  TempSrc: Oral  SpO2: 98%  Weight: 179 lb 12.8 oz (81.6 kg)     Physical Exam Cardiovascular:     Rate and Rhythm: Normal rate and regular rhythm.     Pulses:          Dorsalis pedis pulses are 1+ on the right side and 1+ on the left side.     Heart sounds: Normal heart sounds.     Comments: Cap refill in left lower extremity >3s Pulmonary:     Effort: Pulmonary effort is normal.     Breath sounds: Normal breath sounds.  Musculoskeletal:        General: Swelling present.     Left lower leg: Edema present.     Comments: Left lower extremity is paler than his right leg and slightly edematous. There is less hair growth on his left leg. His left toes are erythematous.   Neurological:     Sensory: Sensory deficit present.  Psychiatric:        Mood and Affect: Mood normal.        Behavior: Behavior normal.      No results found for any visits on 08/04/19.     Assessment & Plan    1. Claudication [facility])  History consistent with claudication and physical exam consistent with peripheral vascular disease. Foot is perfused though capillary refill is reduced. Will refer him to vascular surgery urgently for further evaluation.   - Ambulatory referral to Vascular Surgery  2. Left leg numbness  - Ambulatory referral to Vascular Surgery  The entirety of the information documented in the History of Present Illness, Review of Systems and Physical Exam were personally obtained by me. Portions of this information were initially documented by Jennings Books, CMA and reviewed by me for thoroughness and accuracy.   F/u PRN       Trinna Post, PA-C  H[facility] Alfredia Desanctis,acting as a scribe for Trinna Post, PA-C.,have documented all relevant documentation on the behalf of Trinna Post, PA-C,as directed by  Trinna Post, PA-C while in the presence of Trinna Post, PA-C.

## 2019-08-04 NOTE — Patient Instructions (Signed)
Peripheral Vascular Disease  Peripheral vascular disease (PVD) is a disease of the blood vessels that are not part of your heart and brain. A simple term for PVD is poor circulation. In most cases, PVD narrows the blood vessels that carry blood from your heart to the rest of your body. This can reduce the supply of blood to your arms, legs, and internal organs, like your stomach or kidneys. However, PVD most often affects a person's lower legs and feet. Without treatment, PVD tends to get worse. PVD can also lead to acute ischemic limb. This is when an arm or leg suddenly cannot get enough blood. This is a medical emergency. Follow these instructions at home: Lifestyle  Do not use any products that contain nicotine or tobacco, such as cigarettes and e-cigarettes. If you need help quitting, ask your doctor.  Lose weight if you are overweight. Or, stay at a healthy weight as told by your doctor.  Eat a diet that is low in fat and cholesterol. If you need help, ask your doctor.  Exercise regularly. Ask your doctor for activities that are right for you. General instructions  Take over-the-counter and prescription medicines only as told by your doctor.  Take good care of your feet: ? Wear comfortable shoes that fit well. ? Check your feet often for any cuts or sores.  Keep all follow-up visits as told by your doctor This is important. Contact a doctor if:  You have cramps in your legs when you walk.  You have leg pain when you are at rest.  You have coldness in a leg or foot.  Your skin changes.  You are unable to get or have an erection (erectile dysfunction).  You have cuts or sores on your feet that do not heal. Get help right away if:  Your arm or leg turns cold, numb, and blue.  Your arms or legs become red, warm, swollen, painful, or numb.  You have chest pain.  You have trouble breathing.  You suddenly have weakness in your face, arm, or leg.  You become very  confused or you cannot speak.  You suddenly have a very bad headache.  You suddenly cannot see. Summary  Peripheral vascular disease (PVD) is a disease of the blood vessels.  A simple term for PVD is poor circulation. Without treatment, PVD tends to get worse.  Treatment may include exercise, low fat and low cholesterol diet, and quitting smoking. This information is not intended to replace advice given to you by your health care provider. Make sure you discuss any questions you have with your health care provider. Document Released: 03/05/2010 Document Revised: 11/21/2017 Document Reviewed: 01/16/2017 Elsevier Patient Education  2020 [employer]  

## 2019-08-06 DIAGNOSIS — D485 Neoplasm of uncertain behavior of skin: Secondary | ICD-10-CM | POA: Diagnosis not present

## 2019-08-06 DIAGNOSIS — D2261 Melanocytic nevi of right upper limb, including shoulder: Secondary | ICD-10-CM | POA: Diagnosis not present

## 2019-08-06 DIAGNOSIS — C44222 Squamous cell carcinoma of skin of right ear and external auricular canal: Secondary | ICD-10-CM | POA: Diagnosis not present

## 2019-08-06 DIAGNOSIS — D2262 Melanocytic nevi of left upper limb, including shoulder: Secondary | ICD-10-CM | POA: Diagnosis not present

## 2019-08-06 DIAGNOSIS — L570 Actinic keratosis: Secondary | ICD-10-CM | POA: Diagnosis not present

## 2019-08-06 DIAGNOSIS — L821 Other seborrheic keratosis: Secondary | ICD-10-CM | POA: Diagnosis not present

## 2019-08-06 DIAGNOSIS — C44329 Squamous cell carcinoma of skin of other parts of face: Secondary | ICD-10-CM | POA: Diagnosis not present

## 2019-08-06 DIAGNOSIS — Z85828 Personal history of other malignant neoplasm of skin: Secondary | ICD-10-CM | POA: Diagnosis not present

## 2019-08-06 DIAGNOSIS — D2272 Melanocytic nevi of left lower limb, including hip: Secondary | ICD-10-CM | POA: Diagnosis not present

## 2019-08-06 DIAGNOSIS — X32XXXA Exposure to sunlight, initial encounter: Secondary | ICD-10-CM | POA: Diagnosis not present

## 2019-08-11 ENCOUNTER — Other Ambulatory Visit: Payer: Self-pay | Admitting: Cardiovascular Disease

## 2019-08-11 NOTE — Telephone Encounter (Signed)
Call attempted to schedule 12 mo F/U with Dr. [name]. No answer x 2.

## 2019-08-17 ENCOUNTER — Other Ambulatory Visit: Payer: Self-pay

## 2019-08-17 ENCOUNTER — Encounter (INDEPENDENT_AMBULATORY_CARE_PROVIDER_SITE_OTHER): Payer: Self-pay | Admitting: Vascular Surgery

## 2019-08-17 ENCOUNTER — Ambulatory Visit (INDEPENDENT_AMBULATORY_CARE_PROVIDER_SITE_OTHER): Payer: Medicare Other | Admitting: Vascular Surgery

## 2019-08-17 VITALS — BP 111/64 | HR 93 | Resp 12 | Ht 69.5 in | Wt 185.0 lb

## 2019-08-17 DIAGNOSIS — I509 Heart failure, unspecified: Secondary | ICD-10-CM

## 2019-08-17 DIAGNOSIS — M7989 Other specified soft tissue disorders: Secondary | ICD-10-CM | POA: Diagnosis not present

## 2019-08-17 DIAGNOSIS — M48062 Spinal stenosis, lumbar region with neurogenic claudication: Secondary | ICD-10-CM | POA: Diagnosis not present

## 2019-08-17 DIAGNOSIS — M79605 Pain in left leg: Secondary | ICD-10-CM

## 2019-08-17 DIAGNOSIS — M79609 Pain in unspecified limb: Secondary | ICD-10-CM | POA: Insufficient documentation

## 2019-08-17 DIAGNOSIS — I10 Essential (primary) hypertension: Secondary | ICD-10-CM | POA: Diagnosis not present

## 2019-08-17 DIAGNOSIS — E119 Type 2 diabetes mellitus without complications: Secondary | ICD-10-CM | POA: Diagnosis not present

## 2019-08-17 DIAGNOSIS — E782 Mixed hyperlipidemia: Secondary | ICD-10-CM

## 2019-08-17 DIAGNOSIS — M79604 Pain in right leg: Secondary | ICD-10-CM

## 2019-08-17 NOTE — Assessment & Plan Note (Signed)
Given his multiple atherosclerotic risk factors, peripheral arterial disease could certainly be contributing to his lower extremity symptoms.  The symptoms are very difficult to differentiate from neurogenic claudication.  He may very well have both issues.  I discussed the pathophysiology and natural history of peripheral arterial disease.  This will be done in the near future at the patient's convenience.  We will see him back following the study.

## 2019-08-17 NOTE — Progress Notes (Signed)
MRN : [id number]  Howard [name] is a 85 y.o. (1931-08-23) male who presents with chief complaint of  Chief Complaint  Patient presents with   New Patient (Initial Visit)  .  History of Present Illness: Patient returns today in follow up of A. Pollak for PAD and leg pain.  He has a long history of neurogenic issues after multiple back procedures.  He has severe arthritis in his back and has had pain, numbness, and particularly his left foot has been cool to the touch.  The left leg is also bothered by swelling which seems to progress throughout the day.  The right leg has the pain and numbness, but does not really have the swelling.  This has been a steadily progressing problem over months to years.  There is no clear inciting event or causative factor that started the symptoms.  Nothing has really made it much better.  Given his multiple atherosclerotic risk factors as well as the new swelling, a vascular source of some of the lower extremity symptoms was considered appropriately by his primary care physician.  As such, he is referred for further evaluation and treatment  Current Outpatient Medications  Medication Sig Dispense Refill   aspirin EC 81 MG tablet Take 81 mg by mouth daily.     chlorthalidone (HYGROTON) 25 MG tablet Take 1 tablet (25 mg total) by mouth daily. 90 tablet 3   Cholecalciferol (VITAMIN D3) 1000 UNITS CAPS Take 1,000 Units by mouth daily.      ENTRESTO 97-103 MG TAKE 1 TABLET TWICE A DAY (Patient taking differently: Take 1 tablet by mouth 2 (two) times a day. ) 180 tablet 3   fentaNYL (DURAGESIC) 12 MCG/HR Place 1 patch onto the skin every 3 (three) days. 10 patch 0   fluticasone (FLONASE) 50 MCG/ACT nasal spray Place 2 sprays into both nostrils daily.     isosorbide mononitrate (IMDUR) 30 MG 24 hr tablet TAKE 1 TABLET BY MOUTH DAILY 90 tablet 1   metFORMIN (GLUCOPHAGE) 500 MG tablet Take 1 tablet (500 mg total) by mouth 2 (two) times daily with a meal. 180  tablet 3   metoprolol succinate (TOPROL-XL) 25 MG 24 hr tablet Take 1 tablet (25 mg) by mouth once daily at bedtime (Patient taking differently: Take 25 mg by mouth at bedtime. ) 90 tablet 3   montelukast (SINGULAIR) 10 MG tablet Take 10 mg by mouth daily as needed (sneezing/allergies.).      Multiple Vitamin (MULTIVITAMIN WITH MINERALS) TABS tablet Take 1 tablet by mouth daily. One-A-Day Multivitamin     omeprazole (PRILOSEC) 40 MG capsule Take 1 capsule (40 mg total) by mouth 2 (two) times daily as needed. (Patient taking differently: Take 40 mg by mouth 2 (two) times a day. ) 180 capsule 3   simvastatin (ZOCOR) 40 MG tablet TAKE 1 TABLET AT BEDTIME 90 tablet 0   spironolactone (ALDACTONE) 50 MG tablet Take 1 tablet (50 mg) by mouth once daily in the morning (Patient taking differently: Take 50 mg by mouth every morning. ) 90 tablet 3   albuterol (PROVENTIL HFA;VENTOLIN HFA) 108 (90 Base) MCG/ACT inhaler Inhale 1-2 puffs into the lungs every 6 (six) hours as needed for wheezing or shortness of breath. 1 Inhaler 0   methylPREDNISolone (MEDROL DOSEPAK) 4 MG TBPK tablet Take 4-24 mg by mouth See admin instructions. Take 6-5-4-3-2-1 on consecutive days     psyllium (METAMUCIL) 58.6 % powder Take 1 packet by mouth at bedtime.  No current facility-administered medications for this visit.     Past Medical History:  Diagnosis Date   Arthritis    Bell palsy    Cancer [facility])    prostate and skin   Chronic combined systolic and diastolic CHF, NYHA class 1 ([place])    a. 07/2014 Echo: EF 35-40%, Gr 1 DD.   Complete heart block ([place])    a. 11/2010 s/p SJM 2210 Accent DC PPM, ser# [id number].   Depression    Diabetes mellitus without complication ([place])    Fall 11-10-14   GERD (gastroesophageal reflux disease)    History of prostate cancer    Hyperlipidemia    Hypertension    LBBB (left bundle branch block)    Left-sided Bell's palsy    Lung cancer ([place]) 2016   NICM  (nonischemic cardiomyopathy) ([place])    a. 07/2014 Echo: EF 35-40%, mid-apicalanteroseptal DK, Gr 1 DD, mild-mod dil LA.   Non-obstructive CAD    a. 07/2014 Abnl MV;  b. 08/2014 Cath: LM nl, LAD 30p, RI 40p, LCX nl, OM1 40, RCA dominant 30p, 70d-->Med Rx.   Poor balance    Presence of permanent cardiac pacemaker    Sleep apnea    a. cpap   Vertigo    WPW (Wolff-Parkinson-White syndrome)    a. S/P RFCA 1991.    Past Surgical History:  Procedure Laterality Date   APPLICATION OF WOUND VAC Left 06/07/2015   Procedure: APPLICATION OF WOUND VAC;  Surgeon: Robert Bellow, MD;  Location: [facility] ORS;  Service: General;  Laterality: Left;  left upper back   [place]  08/26/2014   Single vessel obstructive CAD   CARPAL TUNNEL RELEASE  04-04-15   [facility]   CATARACT EXTRACTION  07-31-11 and 09-18-11   Catheter ablation  1991   for WPW   cervical fusion     CHOLECYSTECTOMY  09-07-14   HAND SURGERY     right 1993; left 2005   [place] / REPLACE / REMOVE PACEMAKER     JOINT REPLACEMENT Left 2013   knee   JOINT REPLACEMENT Right 2004   knee   KNEE SURGERY     left knee 1991 and 1992; right knee [place] N/A 08/26/2014   Procedure: LEFT HEART CATHETERIZATION WITH CORONARY ANGIOGRAM;  Surgeon: [name], MD;  Location: [facility] CATH LAB;  Service: Cardiovascular;  Laterality: N/A;   LUMBAR LAMINECTOMY/DECOMPRESSION MICRODISCECTOMY N/A 06/07/2014   Procedure: LUMBAR FOUR TO FIVE LUMBAR LAMINECTOMY/DECOMPRESSION MICRODISCECTOMY 1 LEVEL;  Surgeon: [name], MD;  Location: [place] NEURO ORS;  Service: Neurosurgery;  Laterality: N/A;   LUNG BIOPSY Right 2016   Dr [name]   PACEMAKER INSERTION     PPM-- [employer] Jude 11/30/10 by Storm Frisk GENERATOR CHANGEOUT N/A 07/09/2019   Procedure: PPM GENERATOR CHANGEOUT;  Surgeon: Deboraha Sprang, MD;  Location: [place] CV LAB;  Service:  Cardiovascular;  Laterality: N/A;   PROSTATE SURGERY     cancer--1998, prostatectomy   REPLACEMENT TOTAL KNEE     2004   ruptured disc     1962 and [place] N/A 07/09/2019   Procedure: TEMPORARY PACEMAKER;  Surgeon: Deboraha Sprang, MD;  Location: [place] CV LAB;  Service: Cardiovascular;  Laterality: N/A;   TRIGGER FINGER RELEASE  01-24-15   WOUND DEBRIDEMENT Left 06/07/2015   Procedure: DEBRIDEMENT WOUND;  Surgeon: Robert Bellow, MD;  Location: [facility] ORS;  Service: General;  Laterality: Left;  left upper back    Social History Social History   Tobacco Use   Smoking status: Former Smoker    Years: 4.00   Smokeless tobacco: Never Used   Tobacco comment: Quit 2011  Substance Use Topics   Alcohol use: No    Frequency: Never   Drug use: No    Family History Family History  Problem Relation Age of Onset   Heart attack Mother    Hyperlipidemia Mother    CAD Other    Prostate cancer Neg Hx   no bleeding or clotting disorders  Allergies  Allergen Reactions   Sulfa Antibiotics Rash     REVIEW OF SYSTEMS (Negative unless checked)  Constitutional: [] Weight loss  [] Fever  [] Chills Cardiac: [] Chest pain   [] Chest pressure   [] Palpitations   [] Shortness of breath when laying flat   [] Shortness of breath at rest   [] Shortness of breath with exertion. Vascular:  [x] Pain in legs with walking   [] Pain in legs at rest   [] Pain in legs when laying flat   [x] Claudication   [] Pain in feet when walking  [] Pain in feet at rest  [] Pain in feet when laying flat   [] History of DVT   [] Phlebitis   [] Swelling in legs   [] Varicose veins   [] Non-healing ulcers Pulmonary:   [] Uses home oxygen   [] Productive cough   [] Hemoptysis   [] Wheeze  [] COPD   [] Asthma Neurologic:  [] Dizziness  [] Blackouts   [] Seizures   [] History of stroke   [] History of TIA  [] Aphasia   [] Temporary blindness   [] Dysphagia   [] Weakness or numbness in arms   [x] Weakness or numbness in  legs Musculoskeletal:  [x] Arthritis   [] Joint swelling   [x] Joint pain   [x] Low back pain Hematologic:  [] Easy bruising  [] Easy bleeding   [] Hypercoagulable state   [] Anemic   Gastrointestinal:  [] Blood in stool   [] Vomiting blood  [] Gastroesophageal reflux/heartburn   [] Abdominal pain Genitourinary:  [] Chronic kidney disease   [] Difficult urination  [] Frequent urination  [] Burning with urination   [] Hematuria Skin:  [] Rashes   [] Ulcers   [] Wounds Psychological:  [] History of anxiety   []  History of major depression.  Physical Examination  BP 111/64 (BP Location: Left Arm, Patient Position: Sitting, Cuff Size: Normal)    Pulse 93    Resp 12    Ht 5' 9.5" (1.765 m)    Wt 185 lb (83.9 kg)    BMI 26.93 kg/m  Gen:  WD/WN, NAD Head: [place]/AT, No temporalis wasting. Ear/Nose/Throat: Hearing grossly intact, nares w/o erythema or drainage Eyes: Conjunctiva clear. Sclera non-icteric Neck: Supple.  Trachea midline Pulmonary:  Good air movement, no use of accessory muscles.  Cardiac: RRR, no JVD Vascular:  Vessel Right Left  Radial Palpable Palpable                          PT  1+ palpable  trace palpable  DP Palpable  1+ palpable   Gastrointestinal: soft, non-tender/non-distended Musculoskeletal: M/S 5/5 throughout.  No deformity or atrophy.  Diffuse varicosities a little worse on the right than the left but more stasis dermatitis changes are present on the left than the right.  Left leg and foot are slightly cool to the touch.  1+ left lower extremity edema. Neurologic: Sensation grossly intact in extremities.  Symmetrical.  Speech is fluent.  Psychiatric:  Judgment intact, Mood & affect appropriate for pt's clinical situation. Dermatologic: No rashes or ulcers noted.  No cellulitis or open wounds.       Labs Recent Results (from the past 2160 hour(s))  CUP PACEART REMOTE DEVICE CHECK     Status: None   Collection Time: 05/28/19  6:00 AM  Result Value Ref Range   Date Time  Interrogation Session [id number]    Pulse Generator Manufacturer SJCR    Pulse Gen Model 2210 Accent DR RF    Pulse Gen Serial Number [id number]    Clinic Name [facility]    Implantable Pulse Generator Type Implantable Pulse Generator    Implantable Pulse Generator Implant Date 51761607    Implantable Lead Manufacturer [place] IsoFlex Optim    Implantable Lead Serial Number [id number]    Implantable Lead Implant Date 85462703    Implantable Lead Location [id number]    Implantable Lead Manufacturer [facility]    Implantable Lead Model [id number] Tendril STS    Implantable Lead Serial Number [id number]    Implantable Lead Implant Date 18299371    Implantable Lead Location [id number]    Lead Channel Setting Sensing Sensitivity 4.0 mV   Lead Channel Setting Sensing Adaptation Mode Fixed Pacing    Lead Channel Setting Pacing Amplitude 2.0 V   Lead Channel Setting Pacing Pulse Width 0.8 ms   Lead Channel Setting Pacing Amplitude 2.5 V   Lead Channel Status     Lead Channel Impedance Value 430 ohm   Lead Channel Sensing Intrinsic Amplitude 3.5 mV   Lead Channel Pacing Threshold Amplitude 0.75 V   Lead Channel Pacing Threshold Pulse Width 0.4 ms   Lead Channel Status     Lead Channel Impedance Value 490 ohm   Lead Channel Sensing Intrinsic Amplitude 12.0 mV   Lead Channel Pacing Threshold Amplitude 1.25 V   Lead Channel Pacing Threshold Pulse Width 0.8 ms   Battery Status RRT    Battery Remaining Longevity 1 mo   Battery Remaining Percentage 0.5 %   Battery Voltage 2.57 V   Brady Statistic RA Percent Paced 3.1 %   Brady Statistic RV Percent Paced 99.0 %   Brady Statistic AP VP Percent 3.2 %   Brady Statistic AS VP Percent 97.0 %   Brady Statistic AP VS Percent 1.0 %   Brady Statistic AS VS Percent 1.0 %  Creatinine, serum     Status: None   Collection Time: 06/14/19 10:45 AM  Result Value Ref Range   Creatinine, Ser 1.08 0.61 - 1.24 mg/dL   GFR calc non Af  Amer >60 >60 mL/min   GFR calc Af Amer >60 >60 mL/min    Comment: Performed at [facility], [address] [place], [place] [address]  CUP PACEART REMOTE DEVICE CHECK     Status: None   Collection Time: 06/28/19 12:13 PM  Result Value Ref Range   Pulse Generator Manufacturer SJCR    Date Time Interrogation Session [id number]    Pulse Gen Model 2210 Accent DR RF    Pulse Gen Serial Number [id number]    Clinic Name [place] Pulse Generator Type Implantable Pulse Generator    Implantable Pulse Generator Implant Date 43154008    Implantable Lead Manufacturer [place] IsoFlex Optim    Implantable Lead Serial Number [id number]    Implantable Lead Implant Date 26712458    Implantable Lead Location  [place]    Implantable Lead Model [phone] Tendril STS    Implantable Lead Serial Number [id number]    Implantable Lead Implant Date 29798921    Implantable Lead Location [id number]   CBC w/Diff     Status: Abnormal   Collection Time: 07/01/19  9:24 AM  Result Value Ref Range   WBC 8.4 3.4 - 10.8 x10E3/uL   RBC 5.09 4.14 - 5.80 x10E6/uL   Hemoglobin 14.0 13.0 - 17.7 g/dL   Hematocrit 43.7 37.5 - 51.0 %   MCV 86 79 - 97 fL   MCH 27.5 26.6 - 33.0 pg   MCHC 32.0 31.5 - 35.7 g/dL   RDW 14.6 11.6 - 15.4 %   Platelets 297 150 - 450 x10E3/uL   Neutrophils 62 Not Estab. %   Lymphs 24 Not Estab. %   Monocytes 11 Not Estab. %   Eos 2 Not Estab. %   Basos 1 Not Estab. %   Neutrophils Absolute 5.2 1.4 - 7.0 x10E3/uL   Lymphocytes Absolute 2.0 0.7 - 3.1 x10E3/uL   Monocytes Absolute 1.0 (H) 0.1 - 0.9 x10E3/uL   EOS (ABSOLUTE) 0.2 0.0 - 0.4 x10E3/uL   Basophils Absolute 0.1 0.0 - 0.2 x10E3/uL   Immature Granulocytes 0 Not Estab. %   Immature Grans (Abs) 0.0 0.0 - 0.1 [id number]  Basic metabolic panel     Status: Abnormal   Collection Time: 07/01/19  9:30 AM  Result Value Ref Range   Glucose 123 (H) 65 - 99  mg/dL   BUN 28 (H) 8 - 27 mg/dL   Creatinine, Ser 1.05 0.76 - 1.27 mg/dL   GFR calc non Af Amer 64 >59 mL/min/1.73   GFR calc Af Amer 74 >59 mL/min/1.73   BUN/Creatinine Ratio 27 (H) 10 - 24   Sodium 134 134 - 144 mmol/L   Potassium 4.9 3.5 - 5.2 mmol/L   Chloride 98 96 - 106 mmol/L   CO2 16 (L) 20 - 29 mmol/L   Calcium 9.9 8.6 - 10.2 mg/dL  SARS Coronavirus 2 (Performed in [place] hospital lab)     Status: None   Collection Time: 07/06/19 12:30 PM   Specimen: Nasal Swab  Result Value Ref Range   SARS Coronavirus 2 NEGATIVE NEGATIVE    Comment: (NOTE) SARS-CoV-2 target nucleic acids are NOT DETECTED. The SARS-CoV-2 RNA is generally detectable in upper and lower respiratory specimens during the acute phase of infection. Negative results do not preclude SARS-CoV-2 infection, do not rule out co-infections with other pathogens, and should not be used as the sole basis for treatment or other patient management decisions. Negative results must be combined with clinical observations, patient history, and epidemiological information. The expected result is Negative. Fact Sheet for Patients: SugarRoll.be Fact Sheet for Healthcare Providers: https://www.woods-mathews.com/ This test is not yet approved or cleared by the [place] FDA and  has been authorized for detection and/or diagnosis of SARS-CoV-2 by FDA under an Emergency Use Authorization (EUA). This EUA will remain  in effect (meaning this test can be used) for the duration of the COVID-19 declaration under Section 56 4(b)(1) of the Act, 21 U.S.C. section 360bbb-3(b)(1), unless the authorization is terminated or revoked sooner. Performed at [facility], [place] [address] [place], [place] [address]   Glucose, capillary     Status: Abnormal   Collection Time: 07/09/19  7:46 AM  Result Value Ref Range   Glucose-Capillary 151 (H) 70 - 99 mg/dL  Surgical PCR screen     Status:  None   Collection Time: 07/09/19  8:02 AM   Specimen: Nasal Mucosa; Nasal Swab  Result Value Ref Range   MRSA, PCR NEGATIVE NEGATIVE   Staphylococcus aureus NEGATIVE NEGATIVE    Comment: (NOTE) The Xpert SA Assay (FDA approved for NASAL specimens in patients 53 years of age and older), is one component of a comprehensive surveillance program. It is not intended to diagnose infection nor to guide or monitor treatment. Performed at [facility], [place] [address] [place], [place] [address]   CUP PACEART [facility] DEVICE CHECK     Status: None   Collection Time: 07/20/19  1:52 PM  Result Value Ref Range   Date Time Interrogation Session [id number]    Pulse Generator Manufacturer SJCR    Pulse Gen Model 2272 Assurity MRI    Pulse Gen Serial Number [id number]    Clinic Name [place] Pulse Generator Type Implantable Pulse Generator    Implantable Pulse Generator Implant Date [id number]    Implantable Lead Manufacturer [place] IsoFlex Optim    Implantable Lead Serial Number [id number]    Implantable Lead Implant Date 13244010    Implantable Lead Location [id number]    Implantable Lead Manufacturer [facility]    Implantable Lead Model [id number] Tendril STS    Implantable Lead Serial Number [id number]    Implantable Lead Implant Date 64403474    Implantable Lead Location [id number]    Lead Channel Setting Sensing Sensitivity 4.0 mV   Lead Channel Setting Pacing Amplitude 2.0 V   Lead Channel Setting Pacing Pulse Width 0.5 ms   Lead Channel Setting Pacing Amplitude 0.875    Lead Channel Impedance Value 425.0 ohm   Lead Channel Sensing Intrinsic Amplitude 4.4 mV   Lead Channel Pacing Threshold Amplitude 0.75 V   Lead Channel Pacing Threshold Pulse Width 0.5 ms   Lead Channel Pacing Threshold Amplitude 0.75 V   Lead Channel Pacing Threshold Pulse Width 0.5 ms   Lead Channel Impedance Value 487.5 ohm   Lead Channel Pacing Threshold Amplitude 0.625 V    Lead Channel Pacing Threshold Pulse Width 0.5 ms   Battery Status Unknown    Battery Remaining Longevity 128 mo   Battery Voltage 3.11 V   Brady Statistic RA Percent Paced 0.19 %   Brady Statistic RV Percent Paced 99.98 %   Eval Rhythm VP @ 30     Radiology No results found.  Assessment/Plan  Essential hypertension blood pressure control important in reducing the progression of atherosclerotic disease. On appropriate oral medications.   CHF (congestive heart failure) (HCC) Poor cardiac function can certainly worsen lower extremity perfusion  Type 2 diabetes mellitus without complications (HCC) blood glucose control important in reducing the progression of atherosclerotic disease. Also, involved in wound healing. On appropriate medications.   Hyperlipidemia lipid control important in reducing the progression of atherosclerotic disease. Continue statin therapy   Spinal stenosis, lumbar region, with neurogenic claudication Can certainly worsen lower extremity symptoms, but does not eliminate the possibility of arterial insufficiency particular in a patient with multiple medical comorbidities  Swelling of limb Compression, elevation, and increased activity may benefit the swelling some.  Certainly, venous work-up would be appropriate and a reflux study will be done in the near future at his convenience.  Pain in limb Given his multiple atherosclerotic risk factors, peripheral arterial disease could certainly be contributing to his lower extremity symptoms.  The symptoms are very difficult to differentiate from neurogenic claudication.  He may very well have both issues.  I discussed the pathophysiology and natural history of peripheral arterial disease.  This will be done in the near future at the patient's convenience.  We will see him back following the study.    Leotis Pain, MD  08/17/2019 11:45 AM    This note was created with Dragon medical transcription system.  Any  errors from dictation are purely unintentional

## 2019-08-17 NOTE — Assessment & Plan Note (Signed)
lipid control important in reducing the progression of atherosclerotic disease. Continue statin therapy  

## 2019-08-17 NOTE — Patient Instructions (Signed)
Peripheral Vascular Disease  Peripheral vascular disease (PVD) is a disease of the blood vessels that are not part of your heart and brain. A simple term for PVD is poor circulation. In most cases, PVD narrows the blood vessels that carry blood from your heart to the rest of your body. This can reduce the supply of blood to your arms, legs, and internal organs, like your stomach or kidneys. However, PVD most often affects a person's lower legs and feet. Without treatment, PVD tends to get worse. PVD can also lead to acute ischemic limb. This is when an arm or leg suddenly cannot get enough blood. This is a medical emergency. Follow these instructions at home: Lifestyle  Do not use any products that contain nicotine or tobacco, such as cigarettes and e-cigarettes. If you need help quitting, ask your doctor.  Lose weight if you are overweight. Or, stay at a healthy weight as told by your doctor.  Eat a diet that is low in fat and cholesterol. If you need help, ask your doctor.  Exercise regularly. Ask your doctor for activities that are right for you. General instructions  Take over-the-counter and prescription medicines only as told by your doctor.  Take good care of your feet: ? Wear comfortable shoes that fit well. ? Check your feet often for any cuts or sores.  Keep all follow-up visits as told by your doctor This is important. Contact a doctor if:  You have cramps in your legs when you walk.  You have leg pain when you are at rest.  You have coldness in a leg or foot.  Your skin changes.  You are unable to get or have an erection (erectile dysfunction).  You have cuts or sores on your feet that do not heal. Get help right away if:  Your arm or leg turns cold, numb, and blue.  Your arms or legs become red, warm, swollen, painful, or numb.  You have chest pain.  You have trouble breathing.  You suddenly have weakness in your face, arm, or leg.  You become very  confused or you cannot speak.  You suddenly have a very bad headache.  You suddenly cannot see. Summary  Peripheral vascular disease (PVD) is a disease of the blood vessels.  A simple term for PVD is poor circulation. Without treatment, PVD tends to get worse.  Treatment may include exercise, low fat and low cholesterol diet, and quitting smoking. This information is not intended to replace advice given to you by your health care provider. Make sure you discuss any questions you have with your health care provider. Document Released: 03/05/2010 Document Revised: 11/21/2017 Document Reviewed: 01/16/2017 Elsevier Patient Education  2020 [employer]  

## 2019-08-17 NOTE — Assessment & Plan Note (Signed)
Can certainly worsen lower extremity symptoms, but does not eliminate the possibility of arterial insufficiency particular in a patient with multiple medical comorbidities

## 2019-08-17 NOTE — Assessment & Plan Note (Signed)
blood glucose control important in reducing the progression of atherosclerotic disease. Also, involved in wound healing. On appropriate medications.  

## 2019-08-17 NOTE — Assessment & Plan Note (Signed)
Compression, elevation, and increased activity may benefit the swelling some.  Certainly, venous work-up would be appropriate and a reflux study will be done in the near future at his convenience.

## 2019-08-17 NOTE — Assessment & Plan Note (Signed)
Poor cardiac function can certainly worsen lower extremity perfusion

## 2019-08-17 NOTE — Assessment & Plan Note (Signed)
blood pressure control important in reducing the progression of atherosclerotic disease. On appropriate oral medications.  

## 2019-08-18 ENCOUNTER — Encounter (INDEPENDENT_AMBULATORY_CARE_PROVIDER_SITE_OTHER): Payer: Self-pay | Admitting: Nurse Practitioner

## 2019-08-18 ENCOUNTER — Ambulatory Visit (INDEPENDENT_AMBULATORY_CARE_PROVIDER_SITE_OTHER): Payer: Medicare Other

## 2019-08-18 ENCOUNTER — Other Ambulatory Visit: Payer: Self-pay

## 2019-08-18 ENCOUNTER — Ambulatory Visit (INDEPENDENT_AMBULATORY_CARE_PROVIDER_SITE_OTHER): Payer: Medicare Other | Admitting: Nurse Practitioner

## 2019-08-18 VITALS — BP 122/71 | HR 75 | Resp 10 | Ht 69.5 in | Wt 185.0 lb

## 2019-08-18 DIAGNOSIS — I70212 Atherosclerosis of native arteries of extremities with intermittent claudication, left leg: Secondary | ICD-10-CM

## 2019-08-18 DIAGNOSIS — M7989 Other specified soft tissue disorders: Secondary | ICD-10-CM | POA: Diagnosis not present

## 2019-08-18 DIAGNOSIS — M79605 Pain in left leg: Secondary | ICD-10-CM

## 2019-08-18 DIAGNOSIS — M79604 Pain in right leg: Secondary | ICD-10-CM

## 2019-08-18 DIAGNOSIS — I890 Lymphedema, not elsewhere classified: Secondary | ICD-10-CM

## 2019-08-18 DIAGNOSIS — E119 Type 2 diabetes mellitus without complications: Secondary | ICD-10-CM | POA: Diagnosis not present

## 2019-08-18 DIAGNOSIS — I872 Venous insufficiency (chronic) (peripheral): Secondary | ICD-10-CM

## 2019-08-19 ENCOUNTER — Telehealth (INDEPENDENT_AMBULATORY_CARE_PROVIDER_SITE_OTHER): Payer: Self-pay

## 2019-08-19 NOTE — Telephone Encounter (Signed)
Spoke with patients wife due to him being hard of hearing. Patient is now scheduled with Dr. Lucky Cowboy for 08/26/2019 for angio with a 6:45 am arrival time to the [facility]. Patient will do his Covid testing on 08/23/2019 between 12:30-2:30 pm at the [facility]. Pre-procedure instructions were discussed and will be mailed to the patient.

## 2019-08-21 ENCOUNTER — Encounter: Payer: Self-pay | Admitting: Emergency Medicine

## 2019-08-21 ENCOUNTER — Emergency Department
Admission: EM | Admit: 2019-08-21 | Discharge: 2019-08-21 | Disposition: A | Discharge status: Home / Self Care | Payer: Medicare Other | Source: Home / Self Care | Attending: Emergency Medicine | Admitting: Emergency Medicine

## 2019-08-21 ENCOUNTER — Other Ambulatory Visit: Payer: Self-pay

## 2019-08-21 DIAGNOSIS — I5042 Chronic combined systolic (congestive) and diastolic (congestive) heart failure: Secondary | ICD-10-CM | POA: Diagnosis not present

## 2019-08-21 DIAGNOSIS — Z950 Presence of cardiac pacemaker: Secondary | ICD-10-CM | POA: Insufficient documentation

## 2019-08-21 DIAGNOSIS — Z79899 Other long term (current) drug therapy: Secondary | ICD-10-CM | POA: Insufficient documentation

## 2019-08-21 DIAGNOSIS — I442 Atrioventricular block, complete: Secondary | ICD-10-CM | POA: Diagnosis not present

## 2019-08-21 DIAGNOSIS — L03032 Cellulitis of left toe: Secondary | ICD-10-CM | POA: Insufficient documentation

## 2019-08-21 DIAGNOSIS — I739 Peripheral vascular disease, unspecified: Secondary | ICD-10-CM

## 2019-08-21 DIAGNOSIS — Z20828 Contact with and (suspected) exposure to other viral communicable diseases: Secondary | ICD-10-CM | POA: Diagnosis not present

## 2019-08-21 DIAGNOSIS — R2242 Localized swelling, mass and lump, left lower limb: Secondary | ICD-10-CM | POA: Diagnosis not present

## 2019-08-21 DIAGNOSIS — I2510 Atherosclerotic heart disease of native coronary artery without angina pectoris: Secondary | ICD-10-CM | POA: Insufficient documentation

## 2019-08-21 DIAGNOSIS — Z7982 Long term (current) use of aspirin: Secondary | ICD-10-CM | POA: Insufficient documentation

## 2019-08-21 DIAGNOSIS — Z03818 Encounter for observation for suspected exposure to other biological agents ruled out: Secondary | ICD-10-CM | POA: Diagnosis not present

## 2019-08-21 DIAGNOSIS — E1151 Type 2 diabetes mellitus with diabetic peripheral angiopathy without gangrene: Secondary | ICD-10-CM | POA: Insufficient documentation

## 2019-08-21 DIAGNOSIS — R042 Hemoptysis: Secondary | ICD-10-CM | POA: Diagnosis not present

## 2019-08-21 DIAGNOSIS — I428 Other cardiomyopathies: Secondary | ICD-10-CM | POA: Diagnosis not present

## 2019-08-21 DIAGNOSIS — Z87891 Personal history of nicotine dependence: Secondary | ICD-10-CM | POA: Insufficient documentation

## 2019-08-21 DIAGNOSIS — I110 Hypertensive heart disease with heart failure: Secondary | ICD-10-CM | POA: Insufficient documentation

## 2019-08-21 DIAGNOSIS — Z7984 Long term (current) use of oral hypoglycemic drugs: Secondary | ICD-10-CM | POA: Insufficient documentation

## 2019-08-21 DIAGNOSIS — M7989 Other specified soft tissue disorders: Secondary | ICD-10-CM

## 2019-08-21 DIAGNOSIS — I429 Cardiomyopathy, unspecified: Secondary | ICD-10-CM | POA: Insufficient documentation

## 2019-08-21 LAB — CBC WITH DIFFERENTIAL/PLATELET
Abs Immature Granulocytes: 0.01 10*3/uL (ref 0.00–0.07)
Basophils Absolute: 0.1 10*3/uL (ref 0.0–0.1)
Basophils Relative: 1 %
Eosinophils Absolute: 0.7 10*3/uL — ABNORMAL HIGH (ref 0.0–0.5)
Eosinophils Relative: 11 %
HCT: 42 % (ref 39.0–52.0)
Hemoglobin: 13.7 g/dL (ref 13.0–17.0)
Immature Granulocytes: 0 %
Lymphocytes Relative: 31 %
Lymphs Abs: 1.9 10*3/uL (ref 0.7–4.0)
MCH: 26.9 pg (ref 26.0–34.0)
MCHC: 32.6 g/dL (ref 30.0–36.0)
MCV: 82.4 fL (ref 80.0–100.0)
Monocytes Absolute: 0.7 10*3/uL (ref 0.1–1.0)
Monocytes Relative: 11 %
Neutro Abs: 2.9 10*3/uL (ref 1.7–7.7)
Neutrophils Relative %: 46 %
Platelets: 265 10*3/uL (ref 150–400)
RBC: 5.1 MIL/uL (ref 4.22–5.81)
RDW: 14.3 % (ref 11.5–15.5)
WBC: 6.3 10*3/uL (ref 4.0–10.5)
nRBC: 0 % (ref 0.0–0.2)

## 2019-08-21 LAB — COMPREHENSIVE METABOLIC PANEL
ALT: 18 U/L (ref 0–44)
AST: 21 U/L (ref 15–41)
Albumin: 4.4 g/dL (ref 3.5–5.0)
Alkaline Phosphatase: 48 U/L (ref 38–126)
Anion gap: 10 (ref 5–15)
BUN: 21 mg/dL (ref 8–23)
CO2: 23 mmol/L (ref 22–32)
Calcium: 9.6 mg/dL (ref 8.9–10.3)
Chloride: 101 mmol/L (ref 98–111)
Creatinine, Ser: 0.98 mg/dL (ref 0.61–1.24)
GFR calc Af Amer: >60 mL/min (ref >60)
GFR calc non Af Amer: >60 mL/min (ref >60)
Glucose, Bld: 125 mg/dL — ABNORMAL HIGH (ref 70–99)
Potassium: 4.6 mmol/L (ref 3.5–5.1)
Sodium: 134 mmol/L — ABNORMAL LOW (ref 135–145)
Total Bilirubin: 0.4 mg/dL (ref 0.3–1.2)
Total Protein: 7.6 g/dL (ref 6.5–8.1)

## 2019-08-21 MED ORDER — DOXYCYCLINE HYCLATE 100 MG PO CAPS: 100.0000 mg | ORAL_CAPSULE | Freq: Two times a day (BID) | ORAL | 0 refills | Status: DC

## 2019-08-21 MED ORDER — MUPIROCIN 2 % EX OINT: TOPICAL_OINTMENT | CUTANEOUS | 0 refills | Status: DC

## 2019-08-21 NOTE — ED Provider Notes (Signed)
[facility] Emergency Department Provider Note   ____________________________________________   First MD Initiated Contact with Patient 08/21/19 1507     (approximate)  I have reviewed the triage vital signs and the nursing notes.   HISTORY  Chief Complaint Toe Pain    HPI Howard [name] is a 85 y.o. male with past medical history of PAD, diabetes, and CHF who presents to the ED complaining of toe pain and swelling.  Patient reports that he noticed a cut to the area of his left great toe about 2 days ago, is not sure how he got it.  He has noticed increasing redness, pain, and swelling to that toe and the distal portion of his foot.  He denies any associated fevers, chills, nausea, vomiting.  He has been able to bear weight on his left foot but states of the toe runs into anything it causes him significant pain.  He is currently scheduled for angiography of his left lower extremity in 5 days with Dr. Lucky Cowboy.  He does report swelling in his left lower extremity, but this is been the same over the past couple of months.        Past Medical History:  Diagnosis Date  . Arthritis   . Bell palsy   . Cancer [facility])    prostate and skin  . Chronic combined systolic and diastolic CHF, NYHA class 1 ([place])    a. 07/2014 Echo: EF 35-40%, Gr 1 DD.  Marland Kitchen Complete heart block ([place])    a. 11/2010 s/p SJM 2210 Accent DC PPM, ser# [id number].  . Depression   . Diabetes mellitus without complication ([place])   . Fall 11-10-14  . GERD (gastroesophageal reflux disease)   . History of prostate cancer   . Hyperlipidemia   . Hypertension   . LBBB (left bundle branch block)   . Left-sided Bell's palsy   . Lung cancer ([place]) 2016  . NICM (nonischemic cardiomyopathy) ([place])    a. 07/2014 Echo: EF 35-40%, mid-apicalanteroseptal DK, Gr 1 DD, mild-mod dil LA.  . Non-obstructive CAD    a. 07/2014 Abnl MV;  b. 08/2014 Cath: LM nl, LAD 30p, RI 40p, LCX nl, OM1 40, RCA dominant 30p, 70d-->Med Rx.   Marland Kitchen Poor balance   . Presence of permanent cardiac pacemaker   . Sleep apnea    a. cpap  . Vertigo   . WPW (Wolff-Parkinson-White syndrome)    a. S/P RFCA 1991.    Patient Active Problem List   Diagnosis Date Noted  . Swelling of limb 08/17/2019  . Pain in limb 08/17/2019  . NICM (nonischemic cardiomyopathy) ([place]) 07/01/2019  . CHF (congestive heart failure) ([place]) 07/01/2019  . Malignant neoplasm of right lung ([place]) 04/18/2016  . CAD (coronary artery disease) 04/01/2016  . Adenocarcinoma ([place]) 04/01/2016  . Skin cyst 12/26/2015  . GERD (gastroesophageal reflux disease) 06/16/2015  . Abscess of back 06/06/2015  . Vertigo 03/27/2015  . Carpal tunnel syndrome 10/28/2014  . Status post cholecystectomy 09/29/2014  . Disease of digestive tract 09/29/2014  . Cardiomyopathy ([place]) 09/26/2014  . Type 2 diabetes mellitus without complications ([place])   . Sleep apnea   . Spinal stenosis, lumbar region, with neurogenic claudication 06/07/2014  . Lumbar stenosis with neurogenic claudication 06/07/2014  . Abnormal gait 08/20/2012  . H/O total knee replacement 08/20/2012  . Arthritis of knee, degenerative 08/20/2012  . Pacemaker-St.Jude 08/03/2012  . Cardiac conduction disorder 06/19/2012  . Acid reflux 06/18/2012  . Nodal rhythm disorder  06/18/2012  . Triggering of digit 03/25/2012  . Hyperlipidemia 12/23/2011  . Essential hypertension 03/23/2011  . Atrioventricular block, complete ([place]) 03/23/2011  . Complete atrioventricular block ([place]) 03/23/2011    Past Surgical History:  Procedure Laterality Date  . APPLICATION OF WOUND VAC Left 06/07/2015   Procedure: APPLICATION OF WOUND VAC;  Surgeon: Robert Bellow, MD;  Location: [facility] ORS;  Service: General;  Laterality: Left;  left upper back  . BACK SURGERY     2011  . CARDIAC CATHETERIZATION  08/26/2014   Single vessel obstructive CAD  . CARPAL TUNNEL RELEASE  04-04-15   [facility]  . CATARACT EXTRACTION  07-31-11 and 09-18-11  . Catheter  ablation  1991   for WPW  . cervical fusion    . CHOLECYSTECTOMY  09-07-14  . HAND SURGERY     right 1993; left 2005  . HERNIA REPAIR  1955  . INSERT / REPLACE / REMOVE PACEMAKER    . JOINT REPLACEMENT Left 2013   knee  . JOINT REPLACEMENT Right 2004   knee  . KNEE SURGERY     left knee 1991 and 1992; right knee 1995  . LEFT HEART CATHETERIZATION WITH CORONARY ANGIOGRAM N/A 08/26/2014   Procedure: LEFT HEART CATHETERIZATION WITH CORONARY ANGIOGRAM;  Surgeon: [name], MD;  Location: [facility] CATH LAB;  Service: Cardiovascular;  Laterality: N/A;  . LUMBAR LAMINECTOMY/DECOMPRESSION MICRODISCECTOMY N/A 06/07/2014   Procedure: LUMBAR FOUR TO FIVE LUMBAR LAMINECTOMY/DECOMPRESSION MICRODISCECTOMY 1 LEVEL;  Surgeon: [name], MD;  Location: [place] NEURO ORS;  Service: Neurosurgery;  Laterality: N/A;  . LUNG BIOPSY Right 2016   Dr [name]  . PACEMAKER INSERTION     PPM-- St Jude 11/30/10 by [name]  . PPM GENERATOR CHANGEOUT N/A 07/09/2019   Procedure: PPM GENERATOR CHANGEOUT;  Surgeon: Deboraha Sprang, MD;  Location: [place] CV LAB;  Service: Cardiovascular;  Laterality: N/A;  . PROSTATE SURGERY     cancer--1998, prostatectomy  . REPLACEMENT TOTAL KNEE     2004  . ruptured disc     1962 and 1998  . TEMPORARY PACEMAKER N/A 07/09/2019   Procedure: TEMPORARY PACEMAKER;  Surgeon: Deboraha Sprang, MD;  Location: [place] CV LAB;  Service: Cardiovascular;  Laterality: N/A;  . TRIGGER FINGER RELEASE  01-24-15  . WOUND DEBRIDEMENT Left 06/07/2015   Procedure: DEBRIDEMENT WOUND;  Surgeon: Robert Bellow, MD;  Location: [facility] ORS;  Service: General;  Laterality: Left;  left upper back    Prior to Admission medications   Medication Sig Start Date End Date Taking? Authorizing Provider  aspirin EC 81 MG tablet Take 81 mg by mouth daily.    [provider]  chlorthalidone (HYGROTON) 25 MG tablet Take 1 tablet (25 mg total) by mouth daily. 11/24/18 08/18/19  [name], MD  Cholecalciferol  (VITAMIN D3) 1000 UNITS CAPS Take 1,000 Units by mouth daily.     [provider]  doxycycline (VIBRAMYCIN) 100 MG capsule Take 1 capsule (100 mg total) by mouth 2 (two) times daily for 7 days. 08/21/19 08/28/19  Blake Divine, MD  ENTRESTO 97-103 MG TAKE 1 TABLET TWICE A DAY Patient taking differently: Take 1 tablet by mouth 2 (two) times a day.  02/23/19   Deboraha Sprang, MD  fentaNYL (DURAGESIC) 12 MCG/HR Place 1 patch onto the skin every 3 (three) days. 08/02/19 09/01/19  Molli Barrows, MD  fluticasone (FLONASE) 50 MCG/ACT nasal spray Place 2 sprays into both nostrils daily.    [provider]  isosorbide mononitrate (IMDUR) 30 MG 24 hr tablet TAKE 1 TABLET BY MOUTH DAILY 08/02/19   [name], MD  metFORMIN (GLUCOPHAGE) 500 MG tablet Take 1 tablet (500 mg total) by mouth 2 (two) times daily with a meal. 11/27/18   Chrismon, [name], PA  metoprolol succinate (TOPROL-XL) 25 MG 24 hr tablet Take 1 tablet (25 mg) by mouth once daily at bedtime Patient taking differently: Take 25 mg by mouth at bedtime.  11/26/18   [name], MD  montelukast (SINGULAIR) 10 MG tablet Take 10 mg by mouth daily as needed (sneezing/allergies.).     [provider]  Multiple Vitamin (MULTIVITAMIN WITH MINERALS) TABS tablet Take 1 tablet by mouth daily. One-A-Day Multivitamin    [provider]  mupirocin ointment (BACTROBAN) 2 % Apply to affected area 3 times daily 08/21/19 08/20/20  Blake Divine, MD  omeprazole (PRILOSEC) 40 MG capsule Take 1 capsule (40 mg total) by mouth 2 (two) times daily as needed. Patient taking differently: Take 40 mg by mouth 2 (two) times a day.  12/25/18   Chrismon, [name], PA  simvastatin (ZOCOR) 40 MG tablet TAKE 1 TABLET AT BEDTIME 08/11/19   [name], MD  spironolactone (ALDACTONE) 50 MG tablet Take 1 tablet (50 mg) by mouth once daily in the morning Patient taking differently: Take 50 mg by mouth every morning.  11/24/18   [name], MD    Allergies Sulfa antibiotics  Family History  Problem Relation Age of Onset  . Heart attack Mother   . Hyperlipidemia Mother   . CAD Other   . Prostate cancer Neg Hx     Social History Social History   Tobacco Use  . Smoking status: Former Smoker    Years: 4.00  . Smokeless tobacco: Never Used  . Tobacco comment: Quit 2011  Substance Use Topics  . Alcohol use: No    Frequency: Never  . Drug use: No    Review of Systems  Constitutional: No fever/chills Eyes: No visual changes. ENT: No sore throat. Cardiovascular: Denies chest pain. Respiratory: Denies shortness of breath. Gastrointestinal: No abdominal pain.  No nausea, no vomiting.  No diarrhea.  No constipation. Genitourinary: Negative for dysuria. Musculoskeletal: Negative for back pain.  Positive for foot pain and swelling. Skin: Negative for rash.  Positive for toe wound. Neurological: Negative for headaches, focal weakness or numbness.  ____________________________________________   PHYSICAL EXAM:  VITAL SIGNS: ED Triage Vitals  Enc Vitals Group     BP 08/21/19 1304 (!) 147/100     Pulse Rate 08/21/19 1304 78     Resp 08/21/19 1304 20     Temp 08/21/19 1304 (!) 97.5 F (36.4 C)     Temp Source 08/21/19 1304 Oral     SpO2 08/21/19 1304 97 %     Weight 08/21/19 1308 185 lb (83.9 kg)     Height 08/21/19 1308 5\' 9"  (1.753 m)     Head Circumference --      Peak Flow --      Pain Score 08/21/19 1307 4     Pain Loc --      Pain Edu? --      Excl. in [place]? --     Constitutional: Alert and oriented. Eyes: Conjunctivae are normal. Head: Atraumatic. Nose: No congestion/rhinnorhea. Mouth/Throat: Mucous membranes are moist. Neck: Normal ROM Cardiovascular: Normal rate, regular rhythm. Grossly normal heart sounds.  1+ DP pulses bilaterally. Respiratory: Normal respiratory  effort.  No retractions. Lungs CTAB. Gastrointestinal: Soft and nontender. No distention. Genitourinary: deferred  Musculoskeletal: No lower extremity tenderness nor edema. Neurologic:  Normal speech and language. No gross focal neurologic deficits are appreciated. Skin:  Skin is warm, dry and intact. No rash noted.  Superficial linear wound to distal portion of left great toe with surrounding erythema, warmth, and tenderness.  No focal fluctuance or ulceration. Psychiatric: Mood and affect are normal. Speech and behavior are normal.  ____________________________________________   LABS (all labs ordered are listed, but only abnormal results are displayed)  Labs Reviewed  CBC WITH DIFFERENTIAL/PLATELET - Abnormal; Notable for the following components:      Result Value   Eosinophils Absolute 0.7 (*)    All other components within normal limits  COMPREHENSIVE METABOLIC PANEL - Abnormal; Notable for the following components:   Sodium 134 (*)    Glucose, Bld 125 (*)    All other components within normal limits     PROCEDURES  Procedure(s) performed (including Critical Care):  Procedures   ____________________________________________   INITIAL IMPRESSION / ASSESSMENT AND PLAN / ED COURSE       85 year old male with peripheral arterial disease and plans for angiography in 5 days, now presenting with wound to left great toe with increasing redness, pain, and swelling.  He does not appear systemically ill and lower extremities appear warm and well-perfused.  He has palpable pulse on the left, reviewed prior ABI performed 3 days ago that was 0.26 with left great toe, doubt acute ischemia.  Appears most consistent with cellulitis, doubt DVT as he had negative venous ultrasound of bilateral lower extremities 3 days prior.  Case discussed with Dr. Feliberto Gottron of vascular surgery, who agrees with plan for p.o. antibiotics, also recommends mupirocin.  Counseled patient on close follow-up with vascular surgery and to return to the ED for new or worsening symptoms.  Patient agrees with plan.       ____________________________________________   FINAL CLINICAL IMPRESSION(S) / ED DIAGNOSES  Final diagnoses:  Cellulitis of toe of left foot  PAD (peripheral artery disease) (HCC)  Left leg swelling     ED Discharge Orders         Ordered    doxycycline (VIBRAMYCIN) 100 MG capsule  2 times daily     08/21/19 1611    mupirocin ointment (BACTROBAN) 2 %     08/21/19 1611           Note:  This document was prepared using Dragon voice recognition software and may include unintentional dictation errors.   Blake Divine, MD 08/21/19 2221

## 2019-08-21 NOTE — ED Triage Notes (Signed)
L great toe reddened. Has been for 2 weeks. Had angiogram and is scheduled for surgery 9/3. States his son noted crack to tip of toe this am and wished him to come. Foot warm to touch. States can feel me touching foot but has decreased sensation but same as it has been for 2 weeks. Does not remember injury.

## 2019-08-21 NOTE — Discharge Instructions (Signed)
Please follow-up closely with your vascular surgeon and return to the ED for increasing pain, redness, swelling, or fever.  Take your antibiotic pills twice daily and apply ointment as directed.

## 2019-08-22 ENCOUNTER — Encounter: Payer: Self-pay | Admitting: Emergency Medicine

## 2019-08-22 ENCOUNTER — Other Ambulatory Visit: Payer: Self-pay

## 2019-08-22 ENCOUNTER — Inpatient Hospital Stay
Admission: EM | Admit: 2019-08-22 | Discharge: 2019-08-28 | DRG: 269 | Disposition: A | Discharge status: Home Health | Payer: Medicare Other | Attending: Internal Medicine | Admitting: Internal Medicine

## 2019-08-22 DIAGNOSIS — L03032 Cellulitis of left toe: Secondary | ICD-10-CM

## 2019-08-22 DIAGNOSIS — I5022 Chronic systolic (congestive) heart failure: Secondary | ICD-10-CM | POA: Diagnosis not present

## 2019-08-22 DIAGNOSIS — R042 Hemoptysis: Secondary | ICD-10-CM

## 2019-08-22 DIAGNOSIS — Z03818 Encounter for observation for suspected exposure to other biological agents ruled out: Secondary | ICD-10-CM | POA: Diagnosis not present

## 2019-08-22 DIAGNOSIS — M1990 Unspecified osteoarthritis, unspecified site: Secondary | ICD-10-CM | POA: Diagnosis present

## 2019-08-22 DIAGNOSIS — I959 Hypotension, unspecified: Secondary | ICD-10-CM | POA: Diagnosis not present

## 2019-08-22 DIAGNOSIS — Z8349 Family history of other endocrine, nutritional and metabolic diseases: Secondary | ICD-10-CM

## 2019-08-22 DIAGNOSIS — G4730 Sleep apnea, unspecified: Secondary | ICD-10-CM | POA: Diagnosis present

## 2019-08-22 DIAGNOSIS — Z7951 Long term (current) use of inhaled steroids: Secondary | ICD-10-CM

## 2019-08-22 DIAGNOSIS — K219 Gastro-esophageal reflux disease without esophagitis: Secondary | ICD-10-CM | POA: Diagnosis present

## 2019-08-22 DIAGNOSIS — Z87891 Personal history of nicotine dependence: Secondary | ICD-10-CM | POA: Diagnosis not present

## 2019-08-22 DIAGNOSIS — I110 Hypertensive heart disease with heart failure: Secondary | ICD-10-CM | POA: Diagnosis not present

## 2019-08-22 DIAGNOSIS — I2510 Atherosclerotic heart disease of native coronary artery without angina pectoris: Secondary | ICD-10-CM | POA: Diagnosis present

## 2019-08-22 DIAGNOSIS — I442 Atrioventricular block, complete: Secondary | ICD-10-CM | POA: Diagnosis present

## 2019-08-22 DIAGNOSIS — I447 Left bundle-branch block, unspecified: Secondary | ICD-10-CM | POA: Diagnosis present

## 2019-08-22 DIAGNOSIS — I739 Peripheral vascular disease, unspecified: Secondary | ICD-10-CM

## 2019-08-22 DIAGNOSIS — I70222 Atherosclerosis of native arteries of extremities with rest pain, left leg: Secondary | ICD-10-CM | POA: Diagnosis not present

## 2019-08-22 DIAGNOSIS — Z7982 Long term (current) use of aspirin: Secondary | ICD-10-CM

## 2019-08-22 DIAGNOSIS — Z950 Presence of cardiac pacemaker: Secondary | ICD-10-CM

## 2019-08-22 DIAGNOSIS — I70202 Unspecified atherosclerosis of native arteries of extremities, left leg: Secondary | ICD-10-CM | POA: Diagnosis present

## 2019-08-22 DIAGNOSIS — I998 Other disorder of circulatory system: Secondary | ICD-10-CM | POA: Diagnosis not present

## 2019-08-22 DIAGNOSIS — L0390 Cellulitis, unspecified: Secondary | ICD-10-CM | POA: Diagnosis present

## 2019-08-22 DIAGNOSIS — I70223 Atherosclerosis of native arteries of extremities with rest pain, bilateral legs: Secondary | ICD-10-CM | POA: Diagnosis not present

## 2019-08-22 DIAGNOSIS — I428 Other cardiomyopathies: Secondary | ICD-10-CM | POA: Diagnosis present

## 2019-08-22 DIAGNOSIS — I745 Embolism and thrombosis of iliac artery: Secondary | ICD-10-CM | POA: Diagnosis present

## 2019-08-22 DIAGNOSIS — I70219 Atherosclerosis of native arteries of extremities with intermittent claudication, unspecified extremity: Secondary | ICD-10-CM | POA: Diagnosis not present

## 2019-08-22 DIAGNOSIS — E1151 Type 2 diabetes mellitus with diabetic peripheral angiopathy without gangrene: Secondary | ICD-10-CM | POA: Diagnosis present

## 2019-08-22 DIAGNOSIS — Z7984 Long term (current) use of oral hypoglycemic drugs: Secondary | ICD-10-CM | POA: Diagnosis not present

## 2019-08-22 DIAGNOSIS — Z96653 Presence of artificial knee joint, bilateral: Secondary | ICD-10-CM | POA: Diagnosis present

## 2019-08-22 DIAGNOSIS — Z8546 Personal history of malignant neoplasm of prostate: Secondary | ICD-10-CM | POA: Diagnosis not present

## 2019-08-22 DIAGNOSIS — Z20828 Contact with and (suspected) exposure to other viral communicable diseases: Secondary | ICD-10-CM | POA: Diagnosis present

## 2019-08-22 DIAGNOSIS — E119 Type 2 diabetes mellitus without complications: Secondary | ICD-10-CM | POA: Diagnosis not present

## 2019-08-22 DIAGNOSIS — Z85828 Personal history of other malignant neoplasm of skin: Secondary | ICD-10-CM

## 2019-08-22 DIAGNOSIS — I701 Atherosclerosis of renal artery: Secondary | ICD-10-CM | POA: Diagnosis present

## 2019-08-22 DIAGNOSIS — E785 Hyperlipidemia, unspecified: Secondary | ICD-10-CM | POA: Diagnosis present

## 2019-08-22 DIAGNOSIS — R918 Other nonspecific abnormal finding of lung field: Secondary | ICD-10-CM | POA: Diagnosis present

## 2019-08-22 DIAGNOSIS — Z8249 Family history of ischemic heart disease and other diseases of the circulatory system: Secondary | ICD-10-CM

## 2019-08-22 DIAGNOSIS — I70262 Atherosclerosis of native arteries of extremities with gangrene, left leg: Secondary | ICD-10-CM | POA: Diagnosis not present

## 2019-08-22 DIAGNOSIS — Z85118 Personal history of other malignant neoplasm of bronchus and lung: Secondary | ICD-10-CM

## 2019-08-22 DIAGNOSIS — I5042 Chronic combined systolic (congestive) and diastolic (congestive) heart failure: Secondary | ICD-10-CM | POA: Diagnosis not present

## 2019-08-22 DIAGNOSIS — I714 Abdominal aortic aneurysm, without rupture: Secondary | ICD-10-CM | POA: Diagnosis not present

## 2019-08-22 DIAGNOSIS — I509 Heart failure, unspecified: Secondary | ICD-10-CM | POA: Diagnosis not present

## 2019-08-22 DIAGNOSIS — T45515A Adverse effect of anticoagulants, initial encounter: Secondary | ICD-10-CM | POA: Diagnosis not present

## 2019-08-22 DIAGNOSIS — I700 Atherosclerosis of aorta: Secondary | ICD-10-CM | POA: Diagnosis not present

## 2019-08-22 LAB — CBC WITH DIFFERENTIAL/PLATELET
Abs Immature Granulocytes: 0.01 10*3/uL (ref 0.00–0.07)
Basophils Absolute: 0.1 10*3/uL (ref 0.0–0.1)
Basophils Relative: 1 %
Eosinophils Absolute: 0.7 10*3/uL — ABNORMAL HIGH (ref 0.0–0.5)
Eosinophils Relative: 11 %
HCT: 41 % (ref 39.0–52.0)
Hemoglobin: 13.6 g/dL (ref 13.0–17.0)
Immature Granulocytes: 0 %
Lymphocytes Relative: 31 %
Lymphs Abs: 1.9 10*3/uL (ref 0.7–4.0)
MCH: 27.1 pg (ref 26.0–34.0)
MCHC: 33.2 g/dL (ref 30.0–36.0)
MCV: 81.7 fL (ref 80.0–100.0)
Monocytes Absolute: 0.7 10*3/uL (ref 0.1–1.0)
Monocytes Relative: 11 %
Neutro Abs: 2.8 10*3/uL (ref 1.7–7.7)
Neutrophils Relative %: 46 %
Platelets: 272 10*3/uL (ref 150–400)
RBC: 5.02 MIL/uL (ref 4.22–5.81)
RDW: 14.3 % (ref 11.5–15.5)
WBC: 6.2 10*3/uL (ref 4.0–10.5)
nRBC: 0 % (ref 0.0–0.2)

## 2019-08-22 LAB — BASIC METABOLIC PANEL
Anion gap: 10 (ref 5–15)
BUN: 23 mg/dL (ref 8–23)
CO2: 24 mmol/L (ref 22–32)
Calcium: 9.5 mg/dL (ref 8.9–10.3)
Chloride: 100 mmol/L (ref 98–111)
Creatinine, Ser: 1.06 mg/dL (ref 0.61–1.24)
GFR calc Af Amer: >60 mL/min (ref >60)
GFR calc non Af Amer: >60 mL/min (ref >60)
Glucose, Bld: 148 mg/dL — ABNORMAL HIGH (ref 70–99)
Potassium: 4.3 mmol/L (ref 3.5–5.1)
Sodium: 134 mmol/L — ABNORMAL LOW (ref 135–145)

## 2019-08-22 LAB — GLUCOSE, CAPILLARY: Glucose-Capillary: 115 mg/dL — ABNORMAL HIGH (ref 70–99)

## 2019-08-22 LAB — APTT: aPTT: 48 seconds — ABNORMAL HIGH (ref 24–36)

## 2019-08-22 LAB — SEDIMENTATION RATE: Sed Rate: 18 mm/hr (ref 0–20)

## 2019-08-22 LAB — URIC ACID: Uric Acid, Serum: 9.2 mg/dL — ABNORMAL HIGH (ref 3.7–8.6)

## 2019-08-22 LAB — PROTIME-INR
INR: 1.1 (ref 0.8–1.2)
Prothrombin Time: 14.3 s (ref 11.4–15.2)

## 2019-08-22 LAB — SARS CORONAVIRUS 2 BY RT PCR (HOSPITAL ORDER, PERFORMED IN ~~LOC~~ HOSPITAL LAB): SARS Coronavirus 2: NEGATIVE

## 2019-08-22 LAB — C-REACTIVE PROTEIN: CRP: 0.8 mg/dL (ref <1.0)

## 2019-08-22 MED ORDER — CLINDAMYCIN PHOSPHATE 600 MG/50ML IV SOLN
600.0000 mg | Freq: Once | INTRAVENOUS | Status: AC
  Administered 2019-08-22: 19:00:00 600 mg via INTRAVENOUS
  Filled 2019-08-22 (×2): qty 50

## 2019-08-22 MED ORDER — FLUTICASONE PROPIONATE 50 MCG/ACT NA SUSP
2.0000 | Freq: Every day | NASAL | Status: DC
  Administered 2019-08-23 – 2019-08-27 (×5): 2 via NASAL
  Filled 2019-08-22: qty 16

## 2019-08-22 MED ORDER — ACETAMINOPHEN 650 MG RE SUPP: 650.0000 mg | Freq: Four times a day (QID) | RECTAL | Status: DC | PRN

## 2019-08-22 MED ORDER — MONTELUKAST SODIUM 10 MG PO TABS: 10.0000 mg | ORAL_TABLET | Freq: Every day | ORAL | Status: DC | PRN

## 2019-08-22 MED ORDER — TRAZODONE HCL 50 MG PO TABS: 50.0000 mg | ORAL_TABLET | Freq: Every evening | ORAL | Status: DC | PRN

## 2019-08-22 MED ORDER — METFORMIN HCL 500 MG PO TABS: 500.0000 mg | ORAL_TABLET | Freq: Two times a day (BID) | ORAL | Status: DC

## 2019-08-22 MED ORDER — VANCOMYCIN HCL 10 G IV SOLR: 2000.0000 mg | INTRAVENOUS | Status: DC

## 2019-08-22 MED ORDER — ACETAMINOPHEN 325 MG PO TABS
650.0000 mg | ORAL_TABLET | Freq: Four times a day (QID) | ORAL | Status: DC | PRN
  Filled 2019-08-22: qty 2

## 2019-08-22 MED ORDER — HEPARIN (PORCINE) 25000 UT/250ML-% IV SOLN
1000.0000 [IU]/h | INTRAVENOUS | Status: DC
  Administered 2019-08-22 – 2019-08-23 (×2): 1000 [IU]/h via INTRAVENOUS
  Filled 2019-08-22 (×3): qty 250

## 2019-08-22 MED ORDER — VANCOMYCIN HCL 10 G IV SOLR
2000.0000 mg | Freq: Once | INTRAVENOUS | Status: AC
  Administered 2019-08-22: 23:00:00 2000 mg via INTRAVENOUS
  Filled 2019-08-22: qty 2000

## 2019-08-22 MED ORDER — ASPIRIN EC 81 MG PO TBEC
81.0000 mg | DELAYED_RELEASE_TABLET | Freq: Every day | ORAL | Status: DC
  Administered 2019-08-22 – 2019-08-28 (×6): 81 mg via ORAL
  Filled 2019-08-22 (×6): qty 1

## 2019-08-22 MED ORDER — METOPROLOL SUCCINATE ER 25 MG PO TB24
25.0000 mg | ORAL_TABLET | Freq: Every day | ORAL | Status: DC
  Administered 2019-08-22: 25 mg via ORAL
  Filled 2019-08-22 (×2): qty 1

## 2019-08-22 MED ORDER — SODIUM CHLORIDE 0.9% FLUSH
3.0000 mL | Freq: Two times a day (BID) | INTRAVENOUS | Status: DC
  Administered 2019-08-22 – 2019-08-28 (×9): 3 mL via INTRAVENOUS

## 2019-08-22 MED ORDER — SODIUM CHLORIDE 0.9% FLUSH: 3.0000 mL | INTRAVENOUS | Status: DC | PRN

## 2019-08-22 MED ORDER — ISOSORBIDE MONONITRATE ER 30 MG PO TB24
30.0000 mg | ORAL_TABLET | Freq: Every day | ORAL | Status: DC
  Administered 2019-08-23 – 2019-08-28 (×5): 30 mg via ORAL
  Filled 2019-08-22 (×5): qty 1

## 2019-08-22 MED ORDER — INSULIN ASPART 100 UNIT/ML ~~LOC~~ SOLN
0.0000 [IU] | Freq: Three times a day (TID) | SUBCUTANEOUS | Status: DC
  Administered 2019-08-23: 13:00:00 2 [IU] via SUBCUTANEOUS
  Administered 2019-08-24: 17:00:00 3 [IU] via SUBCUTANEOUS
  Administered 2019-08-24 – 2019-08-25 (×2): 2 [IU] via SUBCUTANEOUS
  Administered 2019-08-26: 12:00:00 3 [IU] via SUBCUTANEOUS
  Administered 2019-08-26 – 2019-08-27 (×3): 2 [IU] via SUBCUTANEOUS
  Administered 2019-08-27: 3 [IU] via SUBCUTANEOUS
  Administered 2019-08-27 – 2019-08-28 (×2): 2 [IU] via SUBCUTANEOUS
  Filled 2019-08-22 (×11): qty 1

## 2019-08-22 MED ORDER — ONDANSETRON HCL 4 MG PO TABS: 4.0000 mg | ORAL_TABLET | Freq: Four times a day (QID) | ORAL | Status: DC | PRN

## 2019-08-22 MED ORDER — INSULIN ASPART 100 UNIT/ML ~~LOC~~ SOLN
0.0000 [IU] | Freq: Every day | SUBCUTANEOUS | Status: DC
  Administered 2019-08-25: 5 [IU] via SUBCUTANEOUS
  Filled 2019-08-22: qty 1

## 2019-08-22 MED ORDER — SIMVASTATIN 40 MG PO TABS
40.0000 mg | ORAL_TABLET | Freq: Every day | ORAL | Status: DC
  Administered 2019-08-22 – 2019-08-27 (×6): 40 mg via ORAL
  Filled 2019-08-22 (×5): qty 1
  Filled 2019-08-22: qty 2
  Filled 2019-08-22 (×2): qty 1

## 2019-08-22 MED ORDER — SODIUM CHLORIDE 0.9 % IV SOLN
1.0000 g | Freq: Three times a day (TID) | INTRAVENOUS | Status: DC
  Administered 2019-08-23: 1 g via INTRAVENOUS
  Filled 2019-08-22 (×3): qty 1

## 2019-08-22 MED ORDER — PANTOPRAZOLE SODIUM 40 MG PO TBEC
40.0000 mg | DELAYED_RELEASE_TABLET | Freq: Every day | ORAL | Status: DC
  Administered 2019-08-22 – 2019-08-28 (×6): 40 mg via ORAL
  Filled 2019-08-22 (×6): qty 1

## 2019-08-22 MED ORDER — ONDANSETRON HCL 4 MG/2ML IJ SOLN
4.0000 mg | Freq: Four times a day (QID) | INTRAMUSCULAR | Status: DC | PRN
  Administered 2019-08-25: 12:00:00 4 mg via INTRAVENOUS

## 2019-08-22 MED ORDER — FENTANYL 12 MCG/HR TD PT72
1.0000 | MEDICATED_PATCH | TRANSDERMAL | Status: DC
  Administered 2019-08-26: 09:00:00 1 via TRANSDERMAL
  Filled 2019-08-22 (×3): qty 1

## 2019-08-22 MED ORDER — HEPARIN BOLUS VIA INFUSION
4000.0000 [IU] | Freq: Once | INTRAVENOUS | Status: AC
  Administered 2019-08-22: 4000 [IU] via INTRAVENOUS
  Filled 2019-08-22: qty 4000

## 2019-08-22 MED ORDER — SPIRONOLACTONE 25 MG PO TABS
50.0000 mg | ORAL_TABLET | Freq: Every morning | ORAL | Status: DC
  Administered 2019-08-23 – 2019-08-28 (×5): 50 mg via ORAL
  Filled 2019-08-22 (×5): qty 2

## 2019-08-22 MED ORDER — SACUBITRIL-VALSARTAN 97-103 MG PO TABS
1.0000 | ORAL_TABLET | Freq: Two times a day (BID) | ORAL | Status: DC
  Administered 2019-08-22 – 2019-08-24 (×4): 1 via ORAL
  Filled 2019-08-22 (×5): qty 1

## 2019-08-22 MED ORDER — SODIUM CHLORIDE 0.9 % IV SOLN
250.0000 mL | INTRAVENOUS | Status: DC | PRN
  Administered 2019-08-22 – 2019-08-23 (×3): 250 mL via INTRAVENOUS

## 2019-08-22 MED ORDER — SODIUM CHLORIDE 0.9 % IV SOLN
1.0000 g | Freq: Once | INTRAVENOUS | Status: AC
  Administered 2019-08-22: 1 g via INTRAVENOUS
  Filled 2019-08-22: qty 1

## 2019-08-22 NOTE — ED Notes (Addendum)
ED TO INPATIENT HANDOFF REPORT  ED Nurse Name and Phone #:  Elmo Putt #[phone]  S Name/Age/Gender Howard [name] 85 y.o. male Room/Bed: ED08A/ED08A  Code Status   Code Status: Full Code  Home/SNF/Other Home Patient oriented to: self, place, time and situation Is this baseline? Yes   Triage Complete: Triage complete  Chief Complaint left leg pain  Triage Note Pt to ED via POV, states that he was seen in our ED yesterday. Pt was diagnosed with cellulitis. Pt was given antibiotics and cream to use. Pt states that he noticed this morning that the redness has gotten worse. Pt is scheduled to have surgery on 9/3. Pt is in NAD.    Allergies Allergies  Allergen Reactions  . Sulfa Antibiotics Rash    Level of Care/Admitting Diagnosis ED Disposition    ED Disposition Condition [facility] Area: [place] [[id number]]  Level of Care: Med-Surg [16]  Covid Evaluation: Asymptomatic Screening Protocol (No Symptoms)  Diagnosis: Cellulitis [700174]  Admitting Physician: Vaughan Basta [[id number]]  Attending Physician: Vaughan Basta [phone]  Estimated length of stay: past midnight tomorrow  Certification:: I certify this patient will need inpatient services for at least 2 midnights  PT Class (Do Not Modify): Inpatient [101]  PT Acc Code (Do Not Modify): Private [1]       B Medical/Surgery History Past Medical History:  Diagnosis Date  . Arthritis   . Bell palsy   . Cancer [facility])    prostate and skin  . Chronic combined systolic and diastolic CHF, NYHA class 1 ([place])    a. 07/2014 Echo: EF 35-40%, Gr 1 DD.  Marland Kitchen Complete heart block ([place])    a. 11/2010 s/p SJM 2210 Accent DC PPM, ser# [id number].  . Depression   . Diabetes mellitus without complication ([place])   . Fall 11-10-14  . GERD (gastroesophageal reflux disease)   . History of prostate cancer   . Hyperlipidemia   . Hypertension   . LBBB (left bundle branch block)   . Left-sided  Bell's palsy   . Lung cancer ([place]) 2016  . NICM (nonischemic cardiomyopathy) ([place])    a. 07/2014 Echo: EF 35-40%, mid-apicalanteroseptal DK, Gr 1 DD, mild-mod dil LA.  . Non-obstructive CAD    a. 07/2014 Abnl MV;  b. 08/2014 Cath: LM nl, LAD 30p, RI 40p, LCX nl, OM1 40, RCA dominant 30p, 70d-->Med Rx.  Marland Kitchen Poor balance   . Presence of permanent cardiac pacemaker   . Sleep apnea    a. cpap  . Vertigo   . WPW (Wolff-Parkinson-White syndrome)    a. S/P RFCA 1991.   Past Surgical History:  Procedure Laterality Date  . APPLICATION OF WOUND VAC Left 06/07/2015   Procedure: APPLICATION OF WOUND VAC;  Surgeon: Robert Bellow, MD;  Location: [facility] ORS;  Service: General;  Laterality: Left;  left upper back  . BACK SURGERY     2011  . CARDIAC CATHETERIZATION  08/26/2014   Single vessel obstructive CAD  . CARPAL TUNNEL RELEASE  04-04-15   [facility]  . CATARACT EXTRACTION  07-31-11 and 09-18-11  . Catheter ablation  1991   for WPW  . cervical fusion    . CHOLECYSTECTOMY  09-07-14  . HAND SURGERY     right 1993; left 2005  . HERNIA REPAIR  1955  . INSERT / REPLACE / REMOVE PACEMAKER    . JOINT REPLACEMENT Left 2013   knee  . JOINT REPLACEMENT Right 2004  knee  . KNEE SURGERY     left knee 1991 and 1992; right knee 1995  . LEFT HEART CATHETERIZATION WITH CORONARY ANGIOGRAM N/A 08/26/2014   Procedure: LEFT HEART CATHETERIZATION WITH CORONARY ANGIOGRAM;  Surgeon: [name], MD;  Location: [facility] CATH LAB;  Service: Cardiovascular;  Laterality: N/A;  . LUMBAR LAMINECTOMY/DECOMPRESSION MICRODISCECTOMY N/A 06/07/2014   Procedure: LUMBAR FOUR TO FIVE LUMBAR LAMINECTOMY/DECOMPRESSION MICRODISCECTOMY 1 LEVEL;  Surgeon: [name], MD;  Location: [place] NEURO ORS;  Service: Neurosurgery;  Laterality: N/A;  . LUNG BIOPSY Right 2016   Dr [name]  . PACEMAKER INSERTION     PPM-- St Jude 11/30/10 by [name]  . PPM GENERATOR CHANGEOUT N/A 07/09/2019   Procedure: PPM GENERATOR CHANGEOUT;  Surgeon: Deboraha Sprang, MD;   Location: [place] CV LAB;  Service: Cardiovascular;  Laterality: N/A;  . PROSTATE SURGERY     cancer--1998, prostatectomy  . REPLACEMENT TOTAL KNEE     2004  . ruptured disc     1962 and 1998  . TEMPORARY PACEMAKER N/A 07/09/2019   Procedure: TEMPORARY PACEMAKER;  Surgeon: Deboraha Sprang, MD;  Location: [place] CV LAB;  Service: Cardiovascular;  Laterality: N/A;  . TRIGGER FINGER RELEASE  01-24-15  . WOUND DEBRIDEMENT Left 06/07/2015   Procedure: DEBRIDEMENT WOUND;  Surgeon: Robert Bellow, MD;  Location: [facility] ORS;  Service: General;  Laterality: Left;  left upper back     A IV Location/Drains/Wounds Patient Lines/Drains/Airways Status   Active Line/Drains/Airways    Name:   Placement date:   Placement time:   Site:   Days:   Peripheral IV 08/22/19 Left Antecubital   08/22/19    1406    Antecubital   less than 1   Epidural Catheter 11/03/18   11/03/18    1250     292   Negative Pressure Wound Therapy Back Left   06/07/15    -    -   1537   Incision (Closed) 06/07/14 Back Other (Comment)   06/07/14    1042     1902   Incision (Closed) 06/07/15 Back   06/07/15    1126     1537          Intake/Output Last 24 hours No intake or output data in the 24 hours ending 08/22/19 1904  Labs/Imaging Results for orders placed or performed during the hospital encounter of 08/22/19 (from the past 48 hour(s))  CBC with Differential     Status: Abnormal   Collection Time: 08/22/19  2:05 PM  Result Value Ref Range   WBC 6.2 4.0 - 10.5 K/uL   RBC 5.02 4.22 - 5.81 MIL/uL   Hemoglobin 13.6 13.0 - 17.0 g/dL   HCT 41.0 39.0 - 52.0 %   MCV 81.7 80.0 - 100.0 fL   MCH 27.1 26.0 - 34.0 pg   MCHC 33.2 30.0 - 36.0 g/dL   RDW 14.3 11.5 - 15.5 %   Platelets 272 150 - 400 K/uL   nRBC 0.0 0.0 - 0.2 %   Neutrophils Relative % 46 %   Neutro Abs 2.8 1.7 - 7.7 K/uL   Lymphocytes Relative 31 %   Lymphs Abs 1.9 0.7 - 4.0 K/uL   Monocytes Relative 11 %   Monocytes Absolute 0.7 0.1 - 1.0 K/uL    Eosinophils Relative 11 %   Eosinophils Absolute 0.7 (H) 0.0 - 0.5 K/uL   Basophils Relative 1 %   Basophils Absolute 0.1 0.0 - 0.1 K/uL  Immature Granulocytes 0 %   Abs Immature Granulocytes 0.01 0.00 - 0.07 K/uL    Comment: Performed at [facility], [address] [place], [place] [address]  Basic metabolic panel     Status: Abnormal   Collection Time: 08/22/19  2:05 PM  Result Value Ref Range   Sodium 134 (L) 135 - 145 mmol/L   Potassium 4.3 3.5 - 5.1 mmol/L   Chloride 100 98 - 111 mmol/L   CO2 24 22 - 32 mmol/L   Glucose, Bld 148 (H) 70 - 99 mg/dL   BUN 23 8 - 23 mg/dL   Creatinine, Ser 1.06 0.61 - 1.24 mg/dL   Calcium 9.5 8.9 - 10.3 mg/dL   GFR calc non Af Amer >60 >60 mL/min   GFR calc Af Amer >60 >60 mL/min   Anion gap 10 5 - 15    Comment: Performed at [facility], [address] [place], [place] [address]  Sedimentation rate     Status: None   Collection Time: 08/22/19  2:05 PM  Result Value Ref Range   Sed Rate 18 0 - 20 mm/hr    Comment: Performed at [facility], [address], [place],  [address]   No results found.  Pending Labs Unresulted Labs (From admission, onward)    Start     Ordered   08/23/19 4008  Basic metabolic panel  Tomorrow morning,   STAT     08/22/19 1825   08/23/19 0500  CBC  Tomorrow morning,   STAT     08/22/19 1825   08/23/19 0500  Protime-INR  Tomorrow morning,   STAT     08/22/19 1825   08/22/19 1826  Culture, blood (routine x 2)  BLOOD CULTURE X 2,   STAT    Comments: for severe disease only    08/22/19 1825   08/22/19 1826  Hemoglobin A1c  Once,   STAT    Comments: To assess prior glycemic control    08/22/19 1825   08/22/19 1809  SARS Coronavirus 2 [facility] order, Performed in [facility] hospital lab) Nasopharyngeal Nasopharyngeal Swab  (Symptomatic/High Risk of Exposure/Tier 1 Patients Labs with Precautions)  Once,   STAT    Question Answer Comment  Is this test for diagnosis or  screening Diagnosis of ill patient   Symptomatic for COVID-19 as defined by CDC Yes   Date of Symptom Onset 08/22/2019   Hospitalized for COVID-19 Yes   Admitted to ICU for COVID-19 No   Previously tested for COVID-19 Yes   Resident in a congregate (group) care setting No   Employed in healthcare setting No      08/22/19 1808   08/22/19 1609  C-reactive protein  Add-on,   AD     08/22/19 1608          Vitals/Pain Today's Vitals   08/22/19 1341 08/22/19 1351  BP: 107/83   Pulse: 90   Resp: 17   Temp: 98.4 F (36.9 C)   TempSrc: Oral   SpO2: 94%   PainSc:  3     Isolation Precautions No active isolations  Medications Medications  sacubitril-valsartan (ENTRESTO) 97-103 mg per tablet (has no administration in time range)  fentaNYL (DURAGESIC) 12 MCG/HR 1 patch (has no administration in time range)  isosorbide mononitrate (IMDUR) 24 hr tablet 30 mg (has no administration in time range)  metFORMIN (GLUCOPHAGE) tablet 500 mg (has no administration in time range)  metoprolol succinate (TOPROL-XL) 24 hr tablet  25 mg (has no administration in time range)  pantoprazole (PROTONIX) EC tablet 40 mg (has no administration in time range)  simvastatin (ZOCOR) tablet 40 mg (has no administration in time range)  spironolactone (ALDACTONE) tablet 50 mg (has no administration in time range)  aspirin EC tablet 81 mg (has no administration in time range)  fluticasone (FLONASE) 50 MCG/ACT nasal spray 2 spray (has no administration in time range)  montelukast (SINGULAIR) tablet 10 mg (has no administration in time range)  vancomycin (VANCOCIN) 2,000 mg in sodium chloride 0.9 % 500 mL IVPB (has no administration in time range)  meropenem (MERREM) 1 g in sodium chloride 0.9 % 100 mL IVPB (has no administration in time range)  sodium chloride flush (NS) 0.9 % injection 3 mL (has no administration in time range)  sodium chloride flush (NS) 0.9 % injection 3 mL (has no administration in time range)   0.9 %  sodium chloride infusion (has no administration in time range)  acetaminophen (TYLENOL) tablet 650 mg (has no administration in time range)    Or  acetaminophen (TYLENOL) suppository 650 mg (has no administration in time range)  ondansetron (ZOFRAN) tablet 4 mg (has no administration in time range)    Or  ondansetron (ZOFRAN) injection 4 mg (has no administration in time range)  traZODone (DESYREL) tablet 50 mg (has no administration in time range)  insulin aspart (novoLOG) injection 0-15 Units (has no administration in time range)  insulin aspart (novoLOG) injection 0-5 Units (has no administration in time range)  clindamycin (CLEOCIN) IVPB 600 mg (600 mg Intravenous New Bag/Given 08/22/19 1831)    Mobility Walks with single crutch Low fall risk   Focused Assessments Cardiac Assessment Handoff:    Lab Results  Component Value Date   CKTOTAL 143 08/26/2014   CKMB 6.0 (H) 08/26/2014   TROPONINI <0.03 07/27/2018   No results found for: DDIMER Does the Patient currently have chest pain? No  , Pulmonary Assessment Handoff:  Lung sounds:  clear bil O2 Device: Room Air   R Recommendations: See Admitting Provider Note  Report given to:   Additional Notes:

## 2019-08-22 NOTE — ED Notes (Signed)
Blood cultures drawn. Unable to completely fill 2nd red top tube from RFA. Pt refusing additional sticks at this time.

## 2019-08-22 NOTE — Progress Notes (Signed)
Noted the finding and Vascular recommendations, Ordered Echo.

## 2019-08-22 NOTE — Consult Note (Signed)
ANTICOAGULATION CONSULT NOTE - Initial Consult  Pharmacy Consult for Heparin Drip Indication: Peripheral Vascular Disease  Allergies  Allergen Reactions  . Sulfa Antibiotics Rash    Patient Measurements: Height: 5' 9.5" (176.5 cm) Weight: 181 lb 14.1 oz (82.5 kg) IBW/kg (Calculated) : 71.85 Heparin Dosing Weight: 82.5 kg  Vital Signs: Temp: 98.6 F (37 C) (08/30 2041) Temp Source: Oral (08/30 2041) BP: 154/81 (08/30 2041) Pulse Rate: 90 (08/30 2041)  Labs: Recent Labs    08/21/19 1310 08/22/19 1405  HGB 13.7 13.6  HCT 42.0 41.0  PLT 265 272  CREATININE 0.98 1.06    Estimated Creatinine Clearance: 50.9 mL/min (by C-G formula based on SCr of 1.06 mg/dL).   Medical History: Past Medical History:  Diagnosis Date  . Arthritis   . Bell palsy   . Cancer [facility])    prostate and skin  . Chronic combined systolic and diastolic CHF, NYHA class 1 ([place])    a. 07/2014 Echo: EF 35-40%, Gr 1 DD.  Marland Kitchen Complete heart block ([place])    a. 11/2010 s/p SJM 2210 [facility] DC PPM, ser# [id number].  . Depression   . Diabetes mellitus without complication ([place])   . Fall 11-10-14  . GERD (gastroesophageal reflux disease)   . History of prostate cancer   . Hyperlipidemia   . Hypertension   . LBBB (left bundle branch block)   . Left-sided Bell's palsy   . Lung cancer ([place]) 2016  . NICM (nonischemic cardiomyopathy) ([place])    a. 07/2014 Echo: EF 35-40%, mid-apicalanteroseptal DK, Gr 1 DD, mild-mod dil LA.  . Non-obstructive CAD    a. 07/2014 Abnl MV;  b. 08/2014 Cath: LM nl, LAD 30p, RI 40p, LCX nl, OM1 40, RCA dominant 30p, 70d-->Med Rx.  Marland Kitchen Poor balance   . Presence of permanent cardiac pacemaker   . Sleep apnea    a. cpap  . Vertigo   . WPW (Wolff-Parkinson-White syndrome)    a. S/P RFCA 1991.    Medications:  Medications Prior to Admission  Medication Sig Dispense Refill Last Dose  . aspirin EC 81 MG tablet Take 81 mg by mouth daily.   08/22/2019 at 0800  . chlorthalidone (HYGROTON) 25  MG tablet Take 1 tablet (25 mg total) by mouth daily. 90 tablet 3 08/22/2019 at 0800  . Cholecalciferol (VITAMIN D3) 1000 UNITS CAPS Take 1,000 Units by mouth daily.    08/22/2019 at 0800  . doxycycline (VIBRAMYCIN) 100 MG capsule Take 1 capsule (100 mg total) by mouth 2 (two) times daily for 7 days. 14 capsule 0 08/22/2019 at 0800  . ENTRESTO 97-103 MG TAKE 1 TABLET TWICE A DAY (Patient taking differently: Take 1 tablet by mouth 2 (two) times a day. ) 180 tablet 3 08/22/2019 at 0800  . fentaNYL (DURAGESIC) 12 MCG/HR Place 1 patch onto the skin every 3 (three) days. 10 patch 0   . fluticasone (FLONASE) 50 MCG/ACT nasal spray Place 2 sprays into both nostrils daily.   08/22/2019 at 0800  . isosorbide mononitrate (IMDUR) 30 MG 24 hr tablet TAKE 1 TABLET BY MOUTH DAILY (Patient taking differently: Take 30 mg by mouth daily. ) 90 tablet 1 08/21/2019 at 2100  . metFORMIN (GLUCOPHAGE) 500 MG tablet Take 1 tablet (500 mg total) by mouth 2 (two) times daily with a meal. 180 tablet 3 08/22/2019 at 0800  . metoprolol succinate (TOPROL-XL) 25 MG 24 hr tablet Take 1 tablet (25 mg) by mouth once daily at bedtime (Patient taking differently: Take  25 mg by mouth at bedtime. ) 90 tablet 3 08/21/2019 at 2100  . montelukast (SINGULAIR) 10 MG tablet Take 10 mg by mouth daily as needed (sneezing/allergies.).    Unknown at PRN  . Multiple Vitamin (MULTIVITAMIN WITH MINERALS) TABS tablet Take 1 tablet by mouth daily. One-A-Day Multivitamin   08/22/2019 at 0800  . mupirocin ointment (BACTROBAN) 2 % Apply to affected area 3 times daily 22 g 0   . omeprazole (PRILOSEC) 40 MG capsule Take 1 capsule (40 mg total) by mouth 2 (two) times daily as needed. (Patient taking differently: Take 40 mg by mouth 2 (two) times a day. ) 180 capsule 3 08/22/2019 at 0800  . simvastatin (ZOCOR) 40 MG tablet TAKE 1 TABLET AT BEDTIME (Patient taking differently: Take 40 mg by mouth at bedtime. ) 90 tablet 0 08/21/2019 at 2100  . spironolactone (ALDACTONE) 50  MG tablet Take 1 tablet (50 mg) by mouth once daily in the morning (Patient taking differently: Take 50 mg by mouth every morning. ) 90 tablet 3 08/22/2019 at 0800   Scheduled:  . aspirin EC  81 mg Oral Daily  . fentaNYL  1 patch Transdermal Q72H  . [START ON 08/23/2019] fluticasone  2 spray Each Nare Daily  . heparin  4,000 Units Intravenous Once  . [START ON 08/23/2019] insulin aspart  0-15 Units Subcutaneous TID WC  . insulin aspart  0-5 Units Subcutaneous QHS  . isosorbide mononitrate  30 mg Oral Daily  . [START ON 08/23/2019] metFORMIN  500 mg Oral BID WC  . metoprolol succinate  25 mg Oral QHS  . pantoprazole  40 mg Oral Daily  . sacubitril-valsartan  1 tablet Oral BID  . simvastatin  40 mg Oral QHS  . sodium chloride flush  3 mL Intravenous Q12H  . [START ON 08/23/2019] spironolactone  50 mg Oral q morning - 10a   Infusions:  . sodium chloride    . heparin    . meropenem (MERREM) IV    . vancomycin     PRN: sodium chloride, acetaminophen **OR** acetaminophen, montelukast, ondansetron **OR** ondansetron (ZOFRAN) IV, sodium chloride flush, traZODone Anti-infectives (From admission, onward)   Start     Dose/Rate Route Frequency Ordered Stop   08/22/19 1830  vancomycin (VANCOCIN) 2,000 mg in sodium chloride 0.9 % 500 mL IVPB     2,000 mg 250 mL/hr over 120 Minutes Intravenous  Once 08/22/19 1825     08/22/19 1830  meropenem (MERREM) 1 g in sodium chloride 0.9 % 100 mL IVPB     1 g 200 mL/hr over 30 Minutes Intravenous  Once 08/22/19 1825     08/22/19 1815  clindamycin (CLEOCIN) IVPB 600 mg     600 mg 100 mL/hr over 30 Minutes Intravenous  Once 08/22/19 1809 08/22/19 1901      Assessment: Pharmacy has been consulted for Heparin Drip initiation on 85yo patient admitted with cellulitis of left foot/toes. Patient scheduled for angiography of the left lower limb in the AM. Does not take any anticoagulant therapy PTA. Will initiate therapy immediately. Baseline labs have been ordered  and are pending.  Goal of Therapy:  Heparin level 0.3-0.7 units/ml Monitor platelets by anticoagulation protocol: Yes   Plan:  Give 4000 units bolus x 1 Start heparin infusion at 1000 units/hr Check anti-Xa level in 8 hours and daily while on heparin Continue to monitor H&H and platelets  Andruw Battie A Andrik Sandt 08/22/2019,9:25 PM

## 2019-08-22 NOTE — Progress Notes (Signed)
Family Meeting Note  Advance Directive:yes  Today a meeting took place with the Patient and spouse.   The following clinical team members were present during this meeting:MD  The following were discussed:Patient's diagnosis: cellulitis/ ischemic toe ,DM, CHF, PAD Patient's progosis: Unable to determine and Goals for treatment: Full Code  Additional follow-up to be provided: [place]  Time spent during discussion:20 minutes  Vaughan Basta, MD

## 2019-08-22 NOTE — ED Triage Notes (Signed)
Pt to ED via POV, states that he was seen in our ED yesterday. Pt was diagnosed with cellulitis. Pt was given antibiotics and cream to use. Pt states that he noticed this morning that the redness has gotten worse. Pt is scheduled to have surgery on 9/3. Pt is in NAD.

## 2019-08-22 NOTE — Consult Note (Signed)
Pharmacy Antibiotic Note  PARDEEP PAUTZ is a 85 y.o. male admitted on 08/22/2019 with cellulitis.  Pharmacy has been consulted for Vancomycin and Merrem dosing. Patient has angiography scheduled in 4 days with Dr. Lucky Cowboy and discharged yesterday on Doxycycline however he returned today due to new redness. Has taken 2 doses of doxycycline with no improvement.  Plan: 1) Vancomycin 2000 mg IV Q 36 hrs. Goal AUC 400-550. Expected AUC: 480.7 Expected Css: 8.6 SCr used: 1.06  2) Meropenem 1g Q8 Hours.      Temp (24hrs), Avg:98.4 F (36.9 C), Min:98.4 F (36.9 C), Max:98.4 F (36.9 C)  Recent Labs  Lab 08/21/19 1310 08/22/19 1405  WBC 6.3 6.2  CREATININE 0.98 1.06    Estimated Creatinine Clearance: 50 mL/min (by C-G formula based on SCr of 1.06 mg/dL).    Allergies  Allergen Reactions  . Sulfa Antibiotics Rash    Antimicrobials this admission: Vancomycin 8/30 >>  Meropenem 8/30 >>    Microbiology results: 8/30 BCx: pending   Thank you for allowing pharmacy to be a part of this patient's care.  Pearla Dubonnet 08/22/2019 8:13 PM

## 2019-08-22 NOTE — Consult Note (Addendum)
Reason for Consult:Left great toe redness and numbness Referring Physician: Dr. Jari Pigg, MD  Howard [name] is an 85 y.o. male.  HPI: week history of left foot rest pain and left great toe redness. Was seen in the ER yesterday and was given ATB. No improvement and new redness in the left lateral foot. Patient states 3-4 weeks ago had a new pacemaker placement.   Past Medical History:  Diagnosis Date  . Arthritis   . Bell palsy   . Cancer [facility])    prostate and skin  . Chronic combined systolic and diastolic CHF, NYHA class 1 ([place])    a. 07/2014 Echo: EF 35-40%, Gr 1 DD.  Marland Kitchen Complete heart block ([place])    a. 11/2010 s/p SJM 2210 [facility] DC PPM, ser# [id number].  . Depression   . Diabetes mellitus without complication ([place])   . Fall 11-10-14  . GERD (gastroesophageal reflux disease)   . History of prostate cancer   . Hyperlipidemia   . Hypertension   . LBBB (left bundle branch block)   . Left-sided Bell's palsy   . Lung cancer ([place]) 2016  . NICM (nonischemic cardiomyopathy) ([place])    a. 07/2014 Echo: EF 35-40%, mid-apicalanteroseptal DK, Gr 1 DD, mild-mod dil LA.  . Non-obstructive CAD    a. 07/2014 Abnl MV;  b. 08/2014 Cath: LM nl, LAD 30p, RI 40p, LCX nl, OM1 40, RCA dominant 30p, 70d-->Med Rx.  Marland Kitchen Poor balance   . Presence of permanent cardiac pacemaker   . Sleep apnea    a. cpap  . Vertigo   . WPW (Wolff-Parkinson-White syndrome)    a. S/P RFCA 1991.    Past Surgical History:  Procedure Laterality Date  . APPLICATION OF WOUND VAC Left 06/07/2015   Procedure: APPLICATION OF WOUND VAC;  Surgeon: Robert Bellow, MD;  Location: [facility] ORS;  Service: General;  Laterality: Left;  left upper back  . BACK SURGERY     2011  . CARDIAC CATHETERIZATION  08/26/2014   Single vessel obstructive CAD  . CARPAL TUNNEL RELEASE  04-04-15   [facility]  . CATARACT EXTRACTION  07-31-11 and 09-18-11  . Catheter ablation  1991   for WPW  . cervical fusion    . CHOLECYSTECTOMY  09-07-14  . HAND SURGERY     right  1993; left 2005  . HERNIA REPAIR  1955  . INSERT / REPLACE / REMOVE PACEMAKER    . JOINT REPLACEMENT Left 2013   knee  . JOINT REPLACEMENT Right 2004   knee  . KNEE SURGERY     left knee 1991 and 1992; right knee 1995  . LEFT HEART CATHETERIZATION WITH CORONARY ANGIOGRAM N/A 08/26/2014   Procedure: LEFT HEART CATHETERIZATION WITH CORONARY ANGIOGRAM;  Surgeon: [name], MD;  Location: [facility] CATH LAB;  Service: Cardiovascular;  Laterality: N/A;  . LUMBAR LAMINECTOMY/DECOMPRESSION MICRODISCECTOMY N/A 06/07/2014   Procedure: LUMBAR FOUR TO FIVE LUMBAR LAMINECTOMY/DECOMPRESSION MICRODISCECTOMY 1 LEVEL;  Surgeon: [name], MD;  Location: [facility] NEURO ORS;  Service: Neurosurgery;  Laterality: N/A;  . LUNG BIOPSY Right 2016   Dr [name]  . PACEMAKER INSERTION     PPM-- [employer] 11/30/10 by [name]  . PPM GENERATOR CHANGEOUT N/A 07/09/2019   Procedure: PPM GENERATOR CHANGEOUT;  Surgeon: Deboraha Sprang, MD;  Location: [facility] CV LAB;  Service: Cardiovascular;  Laterality: N/A;  . PROSTATE SURGERY     cancer--1998, prostatectomy  . REPLACEMENT TOTAL KNEE     2004  .  ruptured disc     1962 and 1998  . TEMPORARY PACEMAKER N/A 07/09/2019   Procedure: TEMPORARY PACEMAKER;  Surgeon: Deboraha Sprang, MD;  Location: [place] CV LAB;  Service: Cardiovascular;  Laterality: N/A;  . TRIGGER FINGER RELEASE  01-24-15  . WOUND DEBRIDEMENT Left 06/07/2015   Procedure: DEBRIDEMENT WOUND;  Surgeon: Robert Bellow, MD;  Location: [facility] ORS;  Service: General;  Laterality: Left;  left upper back    Family History  Problem Relation Age of Onset  . Heart attack Mother   . Hyperlipidemia Mother   . CAD Other   . Prostate cancer Neg Hx     Social History:  reports that he has quit smoking. He quit after 4.00 years of use. He has never used smokeless tobacco. He reports that he does not drink alcohol or use drugs.  Allergies:  Allergies  Allergen Reactions  . Sulfa Antibiotics Rash    Medications: I have  reviewed the patient's current medications.  Results for orders placed or performed during the hospital encounter of 08/22/19 (from the past 48 hour(s))  CBC with Differential     Status: Abnormal   Collection Time: 08/22/19  2:05 PM  Result Value Ref Range   WBC 6.2 4.0 - 10.5 K/uL   RBC 5.02 4.22 - 5.81 MIL/uL   Hemoglobin 13.6 13.0 - 17.0 g/dL   HCT 41.0 39.0 - 52.0 %   MCV 81.7 80.0 - 100.0 fL   MCH 27.1 26.0 - 34.0 pg   MCHC 33.2 30.0 - 36.0 g/dL   RDW 14.3 11.5 - 15.5 %   Platelets 272 150 - 400 K/uL   nRBC 0.0 0.0 - 0.2 %   Neutrophils Relative % 46 %   Neutro Abs 2.8 1.7 - 7.7 K/uL   Lymphocytes Relative 31 %   Lymphs Abs 1.9 0.7 - 4.0 K/uL   Monocytes Relative 11 %   Monocytes Absolute 0.7 0.1 - 1.0 K/uL   Eosinophils Relative 11 %   Eosinophils Absolute 0.7 (H) 0.0 - 0.5 K/uL   Basophils Relative 1 %   Basophils Absolute 0.1 0.0 - 0.1 K/uL   Immature Granulocytes 0 %   Abs Immature Granulocytes 0.01 0.00 - 0.07 K/uL    Comment: Performed at [facility], [address] [place], [place] [address]  Basic metabolic panel     Status: Abnormal   Collection Time: 08/22/19  2:05 PM  Result Value Ref Range   Sodium 134 (L) 135 - 145 mmol/L   Potassium 4.3 3.5 - 5.1 mmol/L   Chloride 100 98 - 111 mmol/L   CO2 24 22 - 32 mmol/L   Glucose, Bld 148 (H) 70 - 99 mg/dL   BUN 23 8 - 23 mg/dL   Creatinine, Ser 1.06 0.61 - 1.24 mg/dL   Calcium 9.5 8.9 - 10.3 mg/dL   GFR calc non Af Amer >60 >60 mL/min   GFR calc Af Amer >60 >60 mL/min   Anion gap 10 5 - 15    Comment: Performed at [facility], [address] [place], [place] [address]  Sedimentation rate     Status: None   Collection Time: 08/22/19  2:05 PM  Result Value Ref Range   Sed Rate 18 0 - 20 mm/hr    Comment: Performed at [facility], [address], [place], [place] [address]  Uric acid     Status: Abnormal   Collection Time: 08/22/19  2:05 PM  Result  Value Ref Range   Uric  Acid, Serum 9.2 (H) 3.7 - 8.6 mg/dL    Comment: Performed at [facility], [address] [place], [place] [address]  SARS Coronavirus 2 [facility] order, Performed in [facility] hospital lab) Nasopharyngeal Nasopharyngeal Swab     Status: None   Collection Time: 08/22/19  6:35 PM   Specimen: Nasopharyngeal Swab  Result Value Ref Range   SARS Coronavirus 2 NEGATIVE NEGATIVE    Comment: (NOTE) If result is NEGATIVE SARS-CoV-2 target nucleic acids are NOT DETECTED. The SARS-CoV-2 RNA is generally detectable in upper and lower  respiratory specimens during the acute phase of infection. The lowest  concentration of SARS-CoV-2 viral copies this assay can detect is 250  copies / mL. A negative result does not preclude SARS-CoV-2 infection  and should not be used as the sole basis for treatment or other  patient management decisions.  A negative result may occur with  improper specimen collection / handling, submission of specimen other  than nasopharyngeal swab, presence of viral mutation(s) within the  areas targeted by this assay, and inadequate number of viral copies  (<250 copies / mL). A negative result must be combined with clinical  observations, patient history, and epidemiological information. If result is POSITIVE SARS-CoV-2 target nucleic acids are DETECTED. The SARS-CoV-2 RNA is generally detectable in upper and lower  respiratory specimens dur ing the acute phase of infection.  Positive  results are indicative of active infection with SARS-CoV-2.  Clinical  correlation with patient history and other diagnostic information is  necessary to determine patient infection status.  Positive results do  not rule out bacterial infection or co-infection with other viruses. If result is PRESUMPTIVE POSTIVE SARS-CoV-2 nucleic acids MAY BE PRESENT.   A presumptive positive result was obtained on the submitted specimen  and confirmed on repeat testing.  While 2019 novel  coronavirus  (SARS-CoV-2) nucleic acids may be present in the submitted sample  additional confirmatory testing may be necessary for epidemiological  and / or clinical management purposes  to differentiate between  SARS-CoV-2 and other Sarbecovirus currently known to infect humans.  If clinically indicated additional testing with an alternate test  methodology [phone]) is advised. The SARS-CoV-2 RNA is generally  detectable in upper and lower respiratory sp ecimens during the acute  phase of infection. The expected result is Negative. Fact Sheet for Patients:  StrictlyIdeas.no Fact Sheet for Healthcare Providers: BankingDealers.co.za This test is not yet approved or cleared by the [place] FDA and has been authorized for detection and/or diagnosis of SARS-CoV-2 by FDA under an Emergency Use Authorization (EUA).  This EUA will remain in effect (meaning this test can be used) for the duration of the COVID-19 declaration under Section 564(b)(1) of the Act, 21 U.S.C. section 360bbb-3(b)(1), unless the authorization is terminated or revoked sooner. Performed at [facility], [address] [place], [place] [address]     No results found.  Review of Systems  All other systems reviewed and are negative.  Blood pressure (!) 150/64, pulse 68, temperature 98.4 F (36.9 C), temperature source Oral, resp. rate 18, SpO2 99 %. Physical Exam  Constitutional: He is oriented to person, place, and time. Vital signs are normal. He appears well-developed and well-nourished.    HENT:  Head: Normocephalic and atraumatic.  Eyes: Pupils are equal, round, and reactive to light.  Neck: Normal range of motion. Neck supple.  Cardiovascular: Normal rate, regular rhythm and normal heart sounds.  Respiratory:  Effort normal.  GI: Soft.  Musculoskeletal: Normal range of motion.  Neurological: He is alert and oriented to person, place, and  time. He has normal reflexes.  Skin: Skin is warm and intact.  doppler signals in the left foot monophasic over the AT/DP only.  No PT signal found   Assessment/Plan: PAD with rest pain, possible trash foot.   Recommend heparin gtt  Plan for angiography of the left lower limb in am NPO after midnight  Recommend cardiac eco to rule out source of distal embolization.   Elmore Guise 08/22/2019, 8:12 PM

## 2019-08-22 NOTE — H&P (Signed)
[place] [facility] [place] NAME: Howard [name]    MR#:  [id number]  DATE OF BIRTH:  Jan 01, 1931  DATE OF ADMISSION:  08/22/2019  PRIMARY CARE PHYSICIAN: Margo Common, PA   REQUESTING/REFERRING PHYSICIAN: Marjean Donna, MD  CHIEF COMPLAINT:   Chief Complaint  Patient presents with  . Cellulitis    HISTORY OF PRESENT ILLNESS:  Howard [name]  is a 85 y.o. male with a known history of peripheral artery disease, diabetes, CHF who was here yesterday for toe pain and swelling.  Exam was consistent with cellulitis.  Patient has an angiography scheduled in 4 days with Dr. dew.  Patient was discharged on doxycycline however he returned today due to new redness. Patient initially only had redness on the great toe.  Now he has noticed redness on the lateral side of his left foot.  He has noted no alleviating or exacerbating factors. He has taken 2 doses of his doxycycline no improvement. No fevers or chills.    He denies nausea or vomiting.  He denies chest pain or shortness of breath.  No worsening pain.  WBC is 6.2.  Patient was scheduled for angiography of lower extremities.  The ED physician has spoken with vascular surgeon, Dr. Feliberto Gottron planning to expedite angiography if possible.  Also recommendations for admission with IV antibiotic therapy.  We have admitted him to the hospital service for further management.  PAST MEDICAL HISTORY:   Past Medical History:  Diagnosis Date  . Arthritis   . Bell palsy   . Cancer [facility])    prostate and skin  . Chronic combined systolic and diastolic CHF, NYHA class 1 ([place])    a. 07/2014 Echo: EF 35-40%, Gr 1 DD.  Marland Kitchen Complete heart block ([place])    a. 11/2010 s/p SJM 2210 [employer] DC PPM, ser# [id number].  . Depression   . Diabetes mellitus without complication ([place])   . Fall 11-10-14  . GERD (gastroesophageal reflux disease)   . History of prostate cancer   . Hyperlipidemia   . Hypertension   . LBBB (left bundle  branch block)   . Left-sided Bell's palsy   . Lung cancer ([place]) 2016  . NICM (nonischemic cardiomyopathy) ([place])    a. 07/2014 Echo: EF 35-40%, mid-apicalanteroseptal DK, Gr 1 DD, mild-mod dil LA.  . Non-obstructive CAD    a. 07/2014 Abnl MV;  b. 08/2014 Cath: LM nl, LAD 30p, RI 40p, LCX nl, OM1 40, RCA dominant 30p, 70d-->Med Rx.  Marland Kitchen Poor balance   . Presence of permanent cardiac pacemaker   . Sleep apnea    a. cpap  . Vertigo   . WPW (Wolff-Parkinson-White syndrome)    a. S/P RFCA 1991.    PAST SURGICAL HISTORY:   Past Surgical History:  Procedure Laterality Date  . APPLICATION OF WOUND VAC Left 06/07/2015   Procedure: APPLICATION OF WOUND VAC;  Surgeon: Robert Bellow, MD;  Location: [facility] ORS;  Service: General;  Laterality: Left;  left upper back  . BACK SURGERY     2011  . CARDIAC CATHETERIZATION  08/26/2014   Single vessel obstructive CAD  . CARPAL TUNNEL RELEASE  04-04-15   [facility]  . CATARACT EXTRACTION  07-31-11 and 09-18-11  . Catheter ablation  1991   for WPW  . cervical fusion    . CHOLECYSTECTOMY  09-07-14  . HAND SURGERY     right 1993; left 2005  . HERNIA REPAIR  1955  . INSERT /  REPLACE / REMOVE PACEMAKER    . JOINT REPLACEMENT Left 2013   knee  . JOINT REPLACEMENT Right 2004   knee  . KNEE SURGERY     left knee 1991 and 1992; right knee 1995  . LEFT HEART CATHETERIZATION WITH CORONARY ANGIOGRAM N/A 08/26/2014   Procedure: LEFT HEART CATHETERIZATION WITH CORONARY ANGIOGRAM;  Surgeon: [name], MD;  Location: [facility] C[facility] LAB;  Service: Cardiovascular;  Laterality: N/A;  . LUMBAR LAMINECTOMY/DECOMPRESSION MICRODISCECTOMY N/A 06/07/2014   Procedure: LUMBAR FOUR TO FIVE LUMBAR LAMINECTOMY/DECOMPRESSION MICRODISCECTOMY 1 LEVEL;  Surgeon: [name], MD;  Location: [facility] NEURO ORS;  Service: Neurosurgery;  Laterality: N/A;  . LUNG BIOPSY Right 2016   Dr [name]  . PACEMAKER INSERTION     PPM-- [employer] Jude 11/30/10 by [name]  . PPM GENERATOR CHANGEOUT N/A 07/09/2019   Procedure: PPM  GENERATOR CHANGEOUT;  Surgeon: Deboraha Sprang, MD;  Location: [place] CV LAB;  Service: Cardiovascular;  Laterality: N/A;  . PROSTATE SURGERY     cancer--1998, prostatectomy  . REPLACEMENT TOTAL KNEE     2004  . ruptured disc     1962 and 1998  . TEMPORARY PACEMAKER N/A 07/09/2019   Procedure: TEMPORARY PACEMAKER;  Surgeon: Deboraha Sprang, MD;  Location: [place] CV LAB;  Service: Cardiovascular;  Laterality: N/A;  . TRIGGER FINGER RELEASE  01-24-15  . WOUND DEBRIDEMENT Left 06/07/2015   Procedure: DEBRIDEMENT WOUND;  Surgeon: Robert Bellow, MD;  Location: [facility] ORS;  Service: General;  Laterality: Left;  left upper back    SOCIAL HISTORY:   Social History   Tobacco Use  . Smoking status: Former Smoker    Years: 4.00  . Smokeless tobacco: Never Used  . Tobacco comment: Quit 2011  Substance Use Topics  . Alcohol use: No    Frequency: Never    FAMILY HISTORY:   Family History  Problem Relation Age of Onset  . Heart attack Mother   . Hyperlipidemia Mother   . CAD Other   . Prostate cancer Neg Hx     DRUG ALLERGIES:   Allergies  Allergen Reactions  . Sulfa Antibiotics Rash    REVIEW OF SYSTEMS:   Review of Systems  Constitutional: Negative for chills, fever and malaise/fatigue.  HENT: Negative for sinus pain and sore throat.   Eyes: Negative for blurred vision and double vision.  Respiratory: Negative for cough, sputum production, shortness of breath and wheezing.   Cardiovascular: Negative for chest pain, palpitations and leg swelling.  Gastrointestinal: Negative for abdominal pain, constipation, diarrhea, heartburn, nausea and vomiting.  Genitourinary: Negative for dysuria, flank pain and hematuria.  Musculoskeletal: Negative for falls and myalgias.  Skin: Negative for itching and rash.       Left foot great toe and lateral left foot erythema and edema with tenderness  Neurological: Negative for dizziness, weakness and headaches.   Psychiatric/Behavioral: Negative for depression.    MEDICATIONS AT HOME:   Prior to Admission medications   Medication Sig Start Date End Date Taking? Authorizing Provider  aspirin EC 81 MG tablet Take 81 mg by mouth daily.    [provider]  chlorthalidone (HYGROTON) 25 MG tablet Take 1 tablet (25 mg total) by mouth daily. 11/24/18 08/18/19  [name], MD  Cholecalciferol (VITAMIN D3) 1000 UNITS CAPS Take 1,000 Units by mouth daily.     [provider]  doxycycline (VIBRAMYCIN) 100 MG capsule Take 1 capsule (100 mg total) by mouth 2 (two) times daily  for 7 days. 08/21/19 08/28/19  Blake Divine, MD  ENTRESTO 97-103 MG TAKE 1 TABLET TWICE A DAY Patient taking differently: Take 1 tablet by mouth 2 (two) times a day.  02/23/19   Deboraha Sprang, MD  fentaNYL (DURAGESIC) 12 MCG/HR Place 1 patch onto the skin every 3 (three) days. 08/02/19 09/01/19  Molli Barrows, MD  fluticasone (FLONASE) 50 MCG/ACT nasal spray Place 2 sprays into both nostrils daily.    [provider]  isosorbide mononitrate (IMDUR) 30 MG 24 hr tablet TAKE 1 TABLET BY MOUTH DAILY 08/02/19   [name], MD  metFORMIN (GLUCOPHAGE) 500 MG tablet Take 1 tablet (500 mg total) by mouth 2 (two) times daily with a meal. 11/27/18   Chrismon, [name], PA  metoprolol succinate (TOPROL-XL) 25 MG 24 hr tablet Take 1 tablet (25 mg) by mouth once daily at bedtime Patient taking differently: Take 25 mg by mouth at bedtime.  11/26/18   [name], MD  montelukast (SINGULAIR) 10 MG tablet Take 10 mg by mouth daily as needed (sneezing/allergies.).     [provider]  Multiple Vitamin (MULTIVITAMIN WITH MINERALS) TABS tablet Take 1 tablet by mouth daily. One-A-Day Multivitamin    [provider]  mupirocin ointment (BACTROBAN) 2 % Apply to affected area 3 times daily 08/21/19 08/20/20  Blake Divine, MD  omeprazole (PRILOSEC) 40 MG capsule Take 1 capsule (40 mg total) by mouth 2 (two)  times daily as needed. Patient taking differently: Take 40 mg by mouth 2 (two) times a day.  12/25/18   Chrismon, [name], PA  simvastatin (ZOCOR) 40 MG tablet TAKE 1 TABLET AT BEDTIME 08/11/19   [name], MD  spironolactone (ALDACTONE) 50 MG tablet Take 1 tablet (50 mg) by mouth once daily in the morning Patient taking differently: Take 50 mg by mouth every morning.  11/24/18   [name], MD      VITAL SIGNS:  Blood pressure 107/83, pulse 90, temperature 98.4 F (36.9 C), temperature source Oral, resp. rate 17, SpO2 94 %.  PHYSICAL EXAMINATION:  Physical Exam  GENERAL:  85 y.o.-year-old patient lying in the bed with no acute distress.  EYES: Pupils equal, round, reactive to light and accommodation. No scleral icterus. Extraocular muscles intact.  HEENT: Head atraumatic, normocephalic. Oropharynx and nasopharynx clear.  NECK:  Supple, no jugular venous distention. No thyroid enlargement, no tenderness.  LUNGS: Normal breath sounds bilaterally, no wheezing, rales,rhonchi or crepitation. No use of accessory muscles of respiration.  CARDIOVASCULAR: Regular rate and rhythm, S1, S2 normal. No murmurs, rubs, or gallops.  ABDOMEN: Soft, nondistended, nontender. Bowel sounds present. No organomegaly or mass.  EXTREMITIES: No pedal edema, cyanosis, or clubbing.  NEUROLOGIC: Cranial nerves II through XII are intact. Muscle strength 5/5 in all extremities. Sensation intact. Gait not checked.  PSYCHIATRIC: The patient is alert and oriented x 3.  Normal affect and good eye contact. SKIN:Left foot great toe and lateral left foot erythema and edema with tenderness  LABORATORY PANEL:   CBC Recent Labs  Lab 08/22/19 1405  WBC 6.2  HGB 13.6  HCT 41.0  PLT 272   ------------------------------------------------------------------------------------------------------------------  Chemistries  Recent Labs  Lab 08/21/19 1310 08/22/19 1405  NA 134* 134*  K 4.6 4.3  CL 101 100  CO2  23 24  GLUCOSE 125* 148*  BUN 21 23  CREATININE 0.98 1.06  CALCIUM 9.6 9.5  AST 21  --   ALT 18  --  ALKPHOS 48  --   BILITOT 0.4  --    ------------------------------------------------------------------------------------------------------------------  Cardiac Enzymes No results for input(s): TROPONINI in the last 168 hours. ------------------------------------------------------------------------------------------------------------------  RADIOLOGY:  No results found.    IMPRESSION AND PLAN:   1.  Left great toe cellulitis - IV vancomycin and Merrem initiated -Vascular surgery, Dr. Feliberto Gottron consulted -Blood cultures pending -N.p.o. after midnight for likely angiography procedure - Physical therapy consulted for supportive care during hospitalization  2.  Diabetes mellitus - Moderate sliding scale insulin -Glucophage continued  3.  CHF- (Echo 2015 with 35-40%EF) -Imdur, metoprolol, and Entresto continued -Telemetry monitoring -Repeat BMP in the a.m.  4.  Peripheral artery disease - Vascular surgery consulted planning angiography in the a.m. likely  DVT prophylaxis with SCDs and PPI prophylaxis initiated    All the records are reviewed and case discussed with ED provider. The plan of care was discussed in details with the patient (and family). I answered all questions. The patient agreed to proceed with the above mentioned plan. Further management will depend upon hospital course.   CODE STATUS: Full code  TOTAL TIME TAKING CARE OF THIS PATIENT: 45 minutes.    [place] on 08/22/2019 at 6:25 PM  Pager - [phone]  After 6pm go to [email or web address] - Proofreader  [facility]  Office  [phone]  CC: Primary care physician; Margo Common, PA   Note: This dictation was prepared with Dragon dictation along with smaller phrase technology. Any transcriptional errors that result from this process are  unintentional.

## 2019-08-22 NOTE — ED Notes (Signed)
SST drawn and sent to the lab

## 2019-08-22 NOTE — ED Provider Notes (Addendum)
[facility] Emergency Department Provider Note  ____________________________________________   First MD Initiated Contact with Patient 08/22/19 1728     (approximate)  I have reviewed the triage vital signs and the nursing notes.   HISTORY  Chief Complaint Cellulitis    HPI Howard [name] is a 85 y.o. male with peripheral artery disease, diabetes, CHF who was here yesterday for toe pain and swelling.  Exam was consistent with cellulitis.  Patient has an angiography scheduled in 4 days with Dr. dew.  Patient was discharged on doxycycline however he returns due to new redness.  Patient initially only had redness on the toe.  Now he has some redness on the lateral side of his left foot.  This is been constant onset today, nothing makes it better or worse.  He has taken 2 doses of his doxycycline.  No fevers.  No worsening pain.       Past Medical History:  Diagnosis Date  . Arthritis   . Bell palsy   . Cancer [facility])    prostate and skin  . Chronic combined systolic and diastolic CHF, NYHA class 1 ([place])    a. 07/2014 Echo: EF 35-40%, Gr 1 DD.  Marland Kitchen Complete heart block ([place])    a. 11/2010 s/p SJM 2210 [employer] DC PPM, ser# [id number].  . Depression   . Diabetes mellitus without complication ([place])   . Fall 11-10-14  . GERD (gastroesophageal reflux disease)   . History of prostate cancer   . Hyperlipidemia   . Hypertension   . LBBB (left bundle branch block)   . Left-sided Bell's palsy   . Lung cancer ([place]) 2016  . NICM (nonischemic cardiomyopathy) ([place])    a. 07/2014 Echo: EF 35-40%, mid-apicalanteroseptal DK, Gr 1 DD, mild-mod dil LA.  . Non-obstructive CAD    a. 07/2014 Abnl MV;  b. 08/2014 Cath: LM nl, LAD 30p, RI 40p, LCX nl, OM1 40, RCA dominant 30p, 70d-->Med Rx.  Marland Kitchen Poor balance   . Presence of permanent cardiac pacemaker   . Sleep apnea    a. cpap  . Vertigo   . WPW (Wolff-Parkinson-White syndrome)    a. S/P RFCA 1991.    Patient Active Problem  List   Diagnosis Date Noted  . Swelling of limb 08/17/2019  . Pain in limb 08/17/2019  . NICM (nonischemic cardiomyopathy) ([place]) 07/01/2019  . CHF (congestive heart failure) ([place]) 07/01/2019  . Malignant neoplasm of right lung ([place]) 04/18/2016  . CAD (coronary artery disease) 04/01/2016  . Adenocarcinoma ([place]) 04/01/2016  . Skin cyst 12/26/2015  . GERD (gastroesophageal reflux disease) 06/16/2015  . Abscess of back 06/06/2015  . Vertigo 03/27/2015  . Carpal tunnel syndrome 10/28/2014  . Status post cholecystectomy 09/29/2014  . Disease of digestive tract 09/29/2014  . Cardiomyopathy ([place]) 09/26/2014  . Type 2 diabetes mellitus without complications ([place])   . Sleep apnea   . Spinal stenosis, lumbar region, with neurogenic claudication 06/07/2014  . Lumbar stenosis with neurogenic claudication 06/07/2014  . Abnormal gait 08/20/2012  . H/O total knee replacement 08/20/2012  . Arthritis of knee, degenerative 08/20/2012  . Pacemaker-St.Jude 08/03/2012  . Cardiac conduction disorder 06/19/2012  . Acid reflux 06/18/2012  . 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 Triggering of digit 03/25/2012  . Hyperlipidemia 12/23/2011  . Essential hypertension 03/23/2011  . Atrioventricular block, complete ([place]) 03/23/2011  . Complete atrioventricular block ([place]) 03/23/2011    Past Surgical History:  Procedure Laterality Date  . APPLICATION OF WOUND  VAC Left 06/07/2015   Procedure: APPLICATION OF WOUND VAC;  Surgeon: Robert Bellow, MD;  Location: [facility] ORS;  Service: General;  Laterality: Left;  left upper back  . BACK SURGERY     2011  . CARDIAC CATHETERIZATION  08/26/2014   Single vessel obstructive CAD  . CARPAL TUNNEL RELEASE  04-04-15   [facility]  . CATARACT EXTRACTION  07-31-11 and 09-18-11  . Catheter ablation  1991   for WPW  . cervical fusion    . CHOLECYSTECTOMY  09-07-14  . HAND SURGERY     right 1993; left 2005  . HERNIA REPAIR  1955  . INSERT / REPLACE / REMOVE PACEMAKER    . JOINT  REPLACEMENT Left 2013   knee  . JOINT REPLACEMENT Right 2004   knee  . KNEE SURGERY     left knee 1991 and 1992; right knee 1995  . LEFT HEART CATHETERIZATION WITH CORONARY ANGIOGRAM N/A 08/26/2014   Procedure: LEFT HEART CATHETERIZATION WITH CORONARY ANGIOGRAM;  Surgeon: [name], MD;  Location: [facility] CATH LAB;  Service: Cardiovascular;  Laterality: N/A;  . LUMBAR LAMINECTOMY/DECOMPRESSION MICRODISCECTOMY N/A 06/07/2014   Procedure: LUMBAR FOUR TO FIVE LUMBAR LAMINECTOMY/DECOMPRESSION MICRODISCECTOMY 1 LEVEL;  Surgeon: [name], MD;  Location: [place] NEURO ORS;  Service: Neurosurgery;  Laterality: N/A;  . LUNG BIOPSY Right 2016   Dr [name]  . PACEMAKER INSERTION     PPM-- St Jude 11/30/10 by [name]  . PPM GENERATOR CHANGEOUT N/A 07/09/2019   Procedure: PPM GENERATOR CHANGEOUT;  Surgeon: Deboraha Sprang, MD;  Location: [place] CV LAB;  Service: Cardiovascular;  Laterality: N/A;  . PROSTATE SURGERY     cancer--1998, prostatectomy  . REPLACEMENT TOTAL KNEE     2004  . ruptured disc     1962 and 1998  . TEMPORARY PACEMAKER N/A 07/09/2019   Procedure: TEMPORARY PACEMAKER;  Surgeon: Deboraha Sprang, MD;  Location: [place] CV LAB;  Service: Cardiovascular;  Laterality: N/A;  . TRIGGER FINGER RELEASE  01-24-15  . WOUND DEBRIDEMENT Left 06/07/2015   Procedure: DEBRIDEMENT WOUND;  Surgeon: Robert Bellow, MD;  Location: [facility] ORS;  Service: General;  Laterality: Left;  left upper back    Prior to Admission medications   Medication Sig Start Date End Date Taking? Authorizing Provider  aspirin EC 81 MG tablet Take 81 mg by mouth daily.    [provider]  chlorthalidone (HYGROTON) 25 MG tablet Take 1 tablet (25 mg total) by mouth daily. 11/24/18 08/18/19  [name], MD  Cholecalciferol (VITAMIN D3) 1000 UNITS CAPS Take 1,000 Units by mouth daily.     [provider]  doxycycline (VIBRAMYCIN) 100 MG capsule Take 1 capsule (100 mg total) by mouth 2 (two) times daily for 7  days. 08/21/19 08/28/19  Blake Divine, MD  ENTRESTO 97-103 MG TAKE 1 TABLET TWICE A DAY Patient taking differently: Take 1 tablet by mouth 2 (two) times a day.  02/23/19   Deboraha Sprang, MD  fentaNYL (DURAGESIC) 12 MCG/HR Place 1 patch onto the skin every 3 (three) days. 08/02/19 09/01/19  Molli Barrows, MD  fluticasone (FLONASE) 50 MCG/ACT nasal spray Place 2 sprays into both nostrils daily.    [provider]  isosorbide mononitrate (IMDUR) 30 MG 24 hr tablet TAKE 1 TABLET BY MOUTH DAILY 08/02/19   [name], MD  metFORMIN (GLUCOPHAGE) 500 MG tablet Take 1 tablet (500 mg total) by mouth 2 (two) times daily with a meal.  11/27/18   Chrismon, [name], PA  metoprolol succinate (TOPROL-XL) 25 MG 24 hr tablet Take 1 tablet (25 mg) by mouth once daily at bedtime Patient taking differently: Take 25 mg by mouth at bedtime.  11/26/18   [name], MD  montelukast (SINGULAIR) 10 MG tablet Take 10 mg by mouth daily as needed (sneezing/allergies.).     [provider]  Multiple Vitamin (MULTIVITAMIN WITH MINERALS) TABS tablet Take 1 tablet by mouth daily. One-A-Day Multivitamin    [provider]  mupirocin ointment (BACTROBAN) 2 % Apply to affected area 3 times daily 08/21/19 08/20/20  Blake Divine, MD  omeprazole (PRILOSEC) 40 MG capsule Take 1 capsule (40 mg total) by mouth 2 (two) times daily as needed. Patient taking differently: Take 40 mg by mouth 2 (two) times a day.  12/25/18   Chrismon, [name], PA  simvastatin (ZOCOR) 40 MG tablet TAKE 1 TABLET AT BEDTIME 08/11/19   [name], MD  spironolactone (ALDACTONE) 50 MG tablet Take 1 tablet (50 mg) by mouth once daily in the morning Patient taking differently: Take 50 mg by mouth every morning.  11/24/18   [name], MD    Allergies Sulfa antibiotics  Family History  Problem Relation Age of Onset  . Heart attack Mother   . Hyperlipidemia Mother   . CAD Other   . Prostate cancer Neg Hx      Social History Social History   Tobacco Use  . Smoking status: Former Smoker    Years: 4.00  . Smokeless tobacco: Never Used  . Tobacco comment: Quit 2011  Substance Use Topics  . Alcohol use: No    Frequency: Never  . Drug use: No      Review of Systems Constitutional: No fever/chills Eyes: No visual changes. ENT: No sore throat. Cardiovascular: Denies chest pain. Respiratory: Denies shortness of breath. Gastrointestinal: No abdominal pain.  No nausea, no vomiting.  No diarrhea.  No constipation. Genitourinary: Negative for dysuria. Musculoskeletal: Negative for back pain. Skin: Positive cellulitis Neurological: Negative for headaches, focal weakness or numbness. All other ROS negative ____________________________________________   PHYSICAL EXAM:  VITAL SIGNS: ED Triage Vitals  Enc Vitals Group     BP 08/22/19 1341 107/83     Pulse Rate 08/22/19 1341 90     Resp 08/22/19 1341 17     Temp 08/22/19 1341 98.4 F (36.9 C)     Temp Source 08/22/19 1341 Oral     SpO2 08/22/19 1341 94 %     Weight --      Height --      Head Circumference --      Peak Flow --      Pain Score 08/22/19 1351 3     Pain Loc --      Pain Edu? --      Excl. in [place]? --     Constitutional: Alert and oriented. Well appearing and in no acute distress. Eyes: Conjunctivae are normal. EOMI. Head: Atraumatic. Nose: No congestion/rhinnorhea. Mouth/Throat: Mucous membranes are moist.   Neck: No stridor. Trachea Midline. FROM Cardiovascular: Normal rate, regular rhythm. Grossly normal heart sounds.  Good peripheral circulation. Respiratory: Normal respiratory effort.  No retractions. Lungs CTAB. Gastrointestinal: Soft and nontender. No distention. No abdominal bruits.  Musculoskeletal: Edema of the left leg that is baseline.  Full range of motion of his ankle.  1+ distal pulse on the left Neurologic:  Normal speech and language. No gross focal neurologic deficits are  appreciated.  Skin:  Redness as noted below Psychiatric: Mood and affect are normal. Speech and behavior are normal. GU: Deferred    ____________________________________________   LABS (all labs ordered are listed, but only abnormal results are displayed)  Labs Reviewed  CBC WITH DIFFERENTIAL/PLATELET - Abnormal; Notable for the following components:      Result Value   Eosinophils Absolute 0.7 (*)    All other components within normal limits  BASIC METABOLIC PANEL - Abnormal; Notable for the following components:   Sodium 134 (*)    Glucose, Bld 148 (*)    All other components within normal limits  SARS CORONAVIRUS 2 (HOSPITAL ORDER, [place] [facility])  SEDIMENTATION RATE  C-REACTIVE PROTEIN   ____________________________________________  PROCEDURES  Procedure(s) performed (including Critical Care):  Procedures   ____________________________________________   INITIAL IMPRESSION / ASSESSMENT AND PLAN / ED COURSE  ARLON BLEIER was evaluated in Emergency Department on 08/22/2019 for the symptoms described in the history of present illness. He was evaluated in the context of the global COVID-19 pandemic, which necessitated consideration that the patient might be at risk for infection with the SARS-CoV-2 virus that causes COVID-19. Institutional protocols and algorithms that pertain to the evaluation of patients at risk for COVID-19 are in a state of rapid change based on information released by regulatory bodies including the CDC and federal and state organizations. These policies and algorithms were followed during the patient's care in the ED.    Patient is a well-appearing 85 year old who presents with worsening cellulitis.  Low suspicion for abscess given my physical exam.  Low suspicion for necrotizing fasciitis given well-appearing gentleman not rapidly spreading.  Low suspicion for osteomyelitis given no open wounds.  This is most likely not healing well due to peripheral  artery disease as well as his diabetes.  I will discuss with the vascular team to see if they would like him to continue his antibiotics outpatient versus coming into the hospital.  White count is normal.  No evidence of sepsis  D/w Antezana from the vascular team.  They would like to have patient admitted on IV antibiotics and they will hopefully do the angiogram earlier.  Discussed with the hospital team Angela and they will admit patient     ____________________________________________   FINAL CLINICAL IMPRESSION(S) / ED DIAGNOSES   Final diagnoses:  Cellulitis of toe of left foot  Peripheral artery disease ([place])      MEDICATIONS GIVEN DURING THIS VISIT:  Medications  clindamycin (CLEOCIN) IVPB 600 mg (has no administration in time range)     ED Discharge Orders    None       Note:  This document was prepared using Dragon voice recognition software and may include unintentional dictation errors.   Vanessa Harrison, MD 08/22/19 1757    Vanessa Wardner, MD 08/22/19 [phone]

## 2019-08-23 ENCOUNTER — Other Ambulatory Visit: Admission: RE | Admit: 2019-08-23 | Payer: Medicare Other | Source: Ambulatory Visit

## 2019-08-23 ENCOUNTER — Encounter
Admission: EM | Disposition: A | Discharge status: Home / Self Care | Payer: Self-pay | Source: Home / Self Care | Attending: Internal Medicine

## 2019-08-23 ENCOUNTER — Inpatient Hospital Stay
Admit: 2019-08-23 | Discharge: 2019-08-23 | Disposition: A | Discharge status: Home / Self Care | Payer: Medicare Other | Attending: Internal Medicine | Admitting: Internal Medicine

## 2019-08-23 ENCOUNTER — Encounter (INDEPENDENT_AMBULATORY_CARE_PROVIDER_SITE_OTHER): Payer: Self-pay | Admitting: Nurse Practitioner

## 2019-08-23 ENCOUNTER — Other Ambulatory Visit (INDEPENDENT_AMBULATORY_CARE_PROVIDER_SITE_OTHER): Payer: Self-pay | Admitting: Vascular Surgery

## 2019-08-23 DIAGNOSIS — I70219 Atherosclerosis of native arteries of extremities with intermittent claudication, unspecified extremity: Secondary | ICD-10-CM | POA: Insufficient documentation

## 2019-08-23 DIAGNOSIS — I70222 Atherosclerosis of native arteries of extremities with rest pain, left leg: Secondary | ICD-10-CM

## 2019-08-23 HISTORY — PX: LOWER EXTREMITY ANGIOGRAPHY: CATH118251

## 2019-08-23 LAB — GLUCOSE, CAPILLARY
Glucose-Capillary: 110 mg/dL — ABNORMAL HIGH (ref 70–99)
Glucose-Capillary: 128 mg/dL — ABNORMAL HIGH (ref 70–99)
Glucose-Capillary: 102 mg/dL — ABNORMAL HIGH (ref 70–99)
Glucose-Capillary: 106 mg/dL — ABNORMAL HIGH (ref 70–99)
Glucose-Capillary: 171 mg/dL — ABNORMAL HIGH (ref 70–99)

## 2019-08-23 LAB — CBC
HCT: 39.9 % (ref 39.0–52.0)
Hemoglobin: 13.2 g/dL (ref 13.0–17.0)
MCH: 26.8 pg (ref 26.0–34.0)
MCHC: 33.1 g/dL (ref 30.0–36.0)
MCV: 81.1 fL (ref 80.0–100.0)
Platelets: 228 10*3/uL (ref 150–400)
RBC: 4.92 MIL/uL (ref 4.22–5.81)
RDW: 14.2 % (ref 11.5–15.5)
WBC: 4.9 10*3/uL (ref 4.0–10.5)
nRBC: 0 % (ref 0.0–0.2)

## 2019-08-23 LAB — PROTIME-INR
INR: 1.1 (ref 0.8–1.2)
Prothrombin Time: 13.7 seconds (ref 11.4–15.2)

## 2019-08-23 LAB — ECHOCARDIOGRAM COMPLETE
Height: 69.5 in
Weight: 2910.07 oz

## 2019-08-23 LAB — BASIC METABOLIC PANEL
Anion gap: 9 (ref 5–15)
BUN: 20 mg/dL (ref 8–23)
CO2: 24 mmol/L (ref 22–32)
Calcium: 9 mg/dL (ref 8.9–10.3)
Chloride: 104 mmol/L (ref 98–111)
Creatinine, Ser: 0.92 mg/dL (ref 0.61–1.24)
GFR calc Af Amer: >60 mL/min (ref >60)
GFR calc non Af Amer: >60 mL/min (ref >60)
Glucose, Bld: 132 mg/dL — ABNORMAL HIGH (ref 70–99)
Potassium: 3.9 mmol/L (ref 3.5–5.1)
Sodium: 137 mmol/L (ref 135–145)

## 2019-08-23 LAB — HEPARIN LEVEL (UNFRACTIONATED)
Heparin Unfractionated: 0.34 IU/mL (ref 0.30–0.70)
Heparin Unfractionated: 0.18 IU/mL — ABNORMAL LOW (ref 0.30–0.70)

## 2019-08-23 LAB — HEMOGLOBIN A1C
Hgb A1c MFr Bld: 8 % — ABNORMAL HIGH (ref 4.8–5.6)
Mean Plasma Glucose: 182.9 mg/dL

## 2019-08-23 SURGERY — LOWER EXTREMITY ANGIOGRAPHY
Anesthesia: Moderate Sedation | Laterality: Left

## 2019-08-23 MED ORDER — IODIXANOL 320 MG/ML IV SOLN
INTRAVENOUS | Status: DC | PRN
  Administered 2019-08-23: 17:00:00 60 mL via INTRA_ARTERIAL

## 2019-08-23 MED ORDER — FENTANYL CITRATE (PF) 100 MCG/2ML IJ SOLN
INTRAMUSCULAR | Status: DC | PRN
  Administered 2019-08-23: 50 ug via INTRAVENOUS

## 2019-08-23 MED ORDER — MIDAZOLAM HCL 2 MG/2ML IJ SOLN
INTRAMUSCULAR | Status: DC | PRN
  Administered 2019-08-23: 2 mg via INTRAVENOUS

## 2019-08-23 MED ORDER — ONDANSETRON HCL 4 MG/2ML IJ SOLN: 4.0000 mg | Freq: Four times a day (QID) | INTRAMUSCULAR | Status: DC | PRN

## 2019-08-23 MED ORDER — HEPARIN SODIUM (PORCINE) 1000 UNIT/ML IJ SOLN
INTRAMUSCULAR | Status: AC
  Filled 2019-08-23: qty 1

## 2019-08-23 MED ORDER — SODIUM CHLORIDE 0.9 % IV SOLN
INTRAVENOUS | Status: DC
  Administered 2019-08-23: 18:00:00 via INTRAVENOUS

## 2019-08-23 MED ORDER — CEFAZOLIN SODIUM-DEXTROSE 2-4 GM/100ML-% IV SOLN: 2.0000 g | Freq: Once | INTRAVENOUS | Status: DC

## 2019-08-23 MED ORDER — HEPARIN SODIUM (PORCINE) 1000 UNIT/ML IJ SOLN
INTRAMUSCULAR | Status: DC | PRN
  Administered 2019-08-23: 5000 [IU] via INTRAVENOUS

## 2019-08-23 MED ORDER — SODIUM CHLORIDE 0.9 % IV SOLN
250.0000 mL | INTRAVENOUS | Status: DC | PRN
  Administered 2019-08-23 – 2019-08-24 (×4): 250 mL via INTRAVENOUS

## 2019-08-23 MED ORDER — FENTANYL CITRATE (PF) 100 MCG/2ML IJ SOLN
INTRAMUSCULAR | Status: AC
  Filled 2019-08-23: qty 4

## 2019-08-23 MED ORDER — DIPHENHYDRAMINE HCL 50 MG/ML IJ SOLN: 50.0000 mg | Freq: Once | INTRAMUSCULAR | Status: DC | PRN

## 2019-08-23 MED ORDER — MIDAZOLAM HCL 2 MG/ML PO SYRP: 8.0000 mg | ORAL_SOLUTION | Freq: Once | ORAL | Status: DC | PRN

## 2019-08-23 MED ORDER — FAMOTIDINE 20 MG PO TABS: 40.0000 mg | ORAL_TABLET | Freq: Once | ORAL | Status: DC | PRN

## 2019-08-23 MED ORDER — MIDAZOLAM HCL 5 MG/5ML IJ SOLN
INTRAMUSCULAR | Status: AC
  Filled 2019-08-23: qty 10

## 2019-08-23 MED ORDER — METHYLPREDNISOLONE SODIUM SUCC 125 MG IJ SOLR: 125.0000 mg | Freq: Once | INTRAMUSCULAR | Status: DC | PRN

## 2019-08-23 MED ORDER — HYDROMORPHONE HCL 1 MG/ML IJ SOLN
1.0000 mg | Freq: Once | INTRAMUSCULAR | Status: AC | PRN
  Administered 2019-08-25: 18:00:00 1 mg via INTRAVENOUS
  Filled 2019-08-23: qty 1

## 2019-08-23 MED ORDER — SODIUM CHLORIDE 0.9 % IV SOLN
2.0000 g | INTRAVENOUS | Status: DC
  Administered 2019-08-23 – 2019-08-27 (×5): 2 g via INTRAVENOUS
  Filled 2019-08-23: qty 20
  Filled 2019-08-23: qty 2
  Filled 2019-08-23: qty 20
  Filled 2019-08-23: qty 2
  Filled 2019-08-23: qty 20
  Filled 2019-08-23 (×2): qty 2
  Filled 2019-08-23: qty 20

## 2019-08-23 SURGICAL SUPPLY — 11 items
BALLN LUTONIX DCB 7X60X130 (BALLOONS) ×2
BALLOON LUTONIX DCB 7X60X130 (BALLOONS) IMPLANT
CATH PIG 70CM (CATHETERS) ×1 IMPLANT
DEVICE PRESTO INFLATION (MISCELLANEOUS) ×1 IMPLANT
DEVICE STARCLOSE SE CLOSURE (Vascular Products) ×1 IMPLANT
GLIDEWIRE ADV .035X260CM (WIRE) ×1 IMPLANT
PACK ANGIOGRAPHY (CUSTOM PROCEDURE TRAY) ×2 IMPLANT
SHEATH BRITE TIP 5FRX11 (SHEATH) ×1 IMPLANT
SYR MEDRAD MARK 7 150ML (SYRINGE) ×1 IMPLANT
TUBING CONTRAST HIGH PRESS 72 (TUBING) ×1 IMPLANT
WIRE J 3MM .035X145CM (WIRE) ×1 IMPLANT

## 2019-08-23 NOTE — Progress Notes (Signed)
SUBJECTIVE:  Patient ID: Howard [name], male    DOB: 03/03/1931, 85 y.o.   MRN: [id number] Chief Complaint  Patient presents with  . Follow-up    HPI  Howard [name] is a 85 y.o. male The patient returns to the office for followup and review of the noninvasive studies. There has been a significant deterioration in the lower extremity symptoms.  The patient notes interval shortening of their claudication distance and development of mild rest pain symptoms. No new ulcers or wounds have occurred since the last visit.  There have been no significant changes to the patient's overall health care.  The patient denies amaurosis fugax or recent TIA symptoms. There are no recent neurological changes noted. The patient denies history of DVT, PE or superficial thrombophlebitis. The patient denies recent episodes of angina or shortness of breath.   ABI's Rt=0.90 and Lt=0.52 (No previous ABI's) Duplex US of the right lower extremity arterial system shows biphasic tibial artery waveforms with monophasic waveforms within the left tibial arteries.  The left digit pressures are dampened.  The patient also underwent venous reflux study which reveals reflux within the common femoral vein, saphenofemoral junction, femoral vein, popliteal vein and great saphenous vein bilaterally.  No evidence of DVT or superficial venous thrombosis bilaterally.  Past Medical History:  Diagnosis Date  . Arthritis   . Bell palsy   . Cancer [facility])    prostate and skin  . Chronic combined systolic and diastolic CHF, NYHA class 1 ([place])    a. 07/2014 Echo: EF 35-40%, Gr 1 DD.  Marland Kitchen Complete heart block ([place])    a. 11/2010 s/p SJM 2210 Accent DC PPM, ser# [id number].  . Depression   . Diabetes mellitus without complication ([place])   . Fall 11-10-14  . GERD (gastroesophageal reflux disease)   . History of prostate cancer   . Hyperlipidemia   . Hypertension   . LBBB (left bundle branch block)   . Left-sided Bell's palsy   . Lung  cancer ([place]) 2016  . NICM (nonischemic cardiomyopathy) ([place])    a. 07/2014 Echo: EF 35-40%, mid-apicalanteroseptal DK, Gr 1 DD, mild-mod dil LA.  . Non-obstructive CAD    a. 07/2014 Abnl MV;  b. 08/2014 Cath: LM nl, LAD 30p, RI 40p, LCX nl, OM1 40, RCA dominant 30p, 70d-->Med Rx.  Marland Kitchen Poor balance   . Presence of permanent cardiac pacemaker   . Sleep apnea    a. cpap  . Vertigo   . WPW (Wolff-Parkinson-White syndrome)    a. S/P RFCA 1991.    Past Surgical History:  Procedure Laterality Date  . APPLICATION OF WOUND VAC Left 06/07/2015   Procedure: APPLICATION OF WOUND VAC;  Surgeon: Robert Bellow, MD;  Location: [facility] ORS;  Service: General;  Laterality: Left;  left upper back  . BACK SURGERY     2011  . CARDIAC CATHETERIZATION  08/26/2014   Single vessel obstructive CAD  . CARPAL TUNNEL RELEASE  04-04-15   [facility]  . CATARACT EXTRACTION  07-31-11 and 09-18-11  . Catheter ablation  1991   for WPW  . cervical fusion    . CHOLECYSTECTOMY  09-07-14  . HAND SURGERY     right 1993; left 2005  . HERNIA REPAIR  1955  . INSERT / REPLACE / REMOVE PACEMAKER    . JOINT REPLACEMENT Left 2013   knee  . JOINT REPLACEMENT Right 2004   knee  . KNEE SURGERY     left knee 1991  and 1992; right knee 1995  . LEFT HEART CATHETERIZATION WITH CORONARY ANGIOGRAM N/A 08/26/2014   Procedure: LEFT HEART CATHETERIZATION WITH CORONARY ANGIOGRAM;  Surgeon: [name], MD;  Location: [facility] CATH LAB;  Service: Cardiovascular;  Laterality: N/A;  . LUMBAR LAMINECTOMY/DECOMPRESSION MICRODISCECTOMY N/A 06/07/2014   Procedure: LUMBAR FOUR TO FIVE LUMBAR LAMINECTOMY/DECOMPRESSION MICRODISCECTOMY 1 LEVEL;  Surgeon: [name], MD;  Location: [place] NEURO ORS;  Service: Neurosurgery;  Laterality: N/A;  . LUNG BIOPSY Right 2016   Dr [name]  . PACEMAKER INSERTION     PPM-- St Jude 11/30/10 by [name]  . PPM GENERATOR CHANGEOUT N/A 07/09/2019   Procedure: PPM GENERATOR CHANGEOUT;  Surgeon: Deboraha Sprang, MD;  Location: [place] CV  LAB;  Service: Cardiovascular;  Laterality: N/A;  . PROSTATE SURGERY     cancer--1998, prostatectomy  . REPLACEMENT TOTAL KNEE     2004  . ruptured disc     1962 and 1998  . TEMPORARY PACEMAKER N/A 07/09/2019   Procedure: TEMPORARY PACEMAKER;  Surgeon: Deboraha Sprang, MD;  Location: [place] CV LAB;  Service: Cardiovascular;  Laterality: N/A;  . TRIGGER FINGER RELEASE  01-24-15  . WOUND DEBRIDEMENT Left 06/07/2015   Procedure: DEBRIDEMENT WOUND;  Surgeon: Robert Bellow, MD;  Location: [facility] ORS;  Service: General;  Laterality: Left;  left upper back    Social History   Socioeconomic History  . Marital status: Married    Spouse name: Not on file  . Number of children: 2  . Years of education: College  . Highest education level: Some college, no degree  Occupational History  . Occupation: Retired  Scientific laboratory technician  . Financial resource strain: Not hard at all  . Food insecurity    Worry: Never true    Inability: Never true  . Transportation needs    Medical: No    Non-medical: No  Tobacco Use  . Smoking status: Former Smoker    Years: 4.00  . Smokeless tobacco: Never Used  . Tobacco comment: Quit 2011  Substance and Sexual Activity  . Alcohol use: No    Frequency: Never  . Drug use: No  . Sexual activity: Not on file  Lifestyle  . Physical activity    Days per week: 0 days    Minutes per session: 0 min  . Stress: Not at all  Relationships  . Social Herbalist on phone: Patient refused    Gets together: Patient refused    Attends religious service: Patient refused    Active member of club or organization: Patient refused    Attends meetings of clubs or organizations: Patient refused    Relationship status: Patient refused  . Intimate partner violence    Fear of current or ex partner: Patient refused    Emotionally abused: Patient refused    Physically abused: Patient refused    Forced sexual activity: Patient refused  Other Topics Concern  . Not on  file  Social History Narrative   Drinks 2 cups of coffee a day     Family History  Problem Relation Age of Onset  . Heart attack Mother   . Hyperlipidemia Mother   . CAD Other   . Prostate cancer Neg Hx     Allergies  Allergen Reactions  . Sulfa Antibiotics Rash     Review of Systems   Review of Systems: Negative Unless Checked Constitutional: [] Weight loss  [] Fever  [] Chills Cardiac: [] Chest pain   []  Atrial Fibrillation  []   Palpitations   [] Shortness of breath when laying flat   [] Shortness of breath with exertion. [] Shortness of breath at rest Vascular:  [] Pain in legs with walking   [] Pain in legs with standing [] Pain in legs when laying flat   [] Claudication    [] Pain in feet when laying flat    [] History of DVT   [] Phlebitis   [] Swelling in legs   [] Varicose veins   [] Non-healing ulcers Pulmonary:   [] Uses home oxygen   [] Productive cough   [] Hemoptysis   [] Wheeze  [] COPD   [] Asthma Neurologic:  [] Dizziness   [] Seizures  [] Blackouts [] History of stroke   [] History of TIA  [] Aphasia   [] Temporary Blindness   [] Weakness or numbness in arm   [] Weakness or numbness in leg Musculoskeletal:   [] Joint swelling   [x] Joint pain   [x] Low back pain  []  History of Knee Replacement [x] Arthritis [] back Surgeries  []  Spinal Stenosis    Hematologic:  [] Easy bruising  [] Easy bleeding   [] Hypercoagulable state   [] Anemic Gastrointestinal:  [] Diarrhea   [] Vomiting  [] Gastroesophageal reflux/heartburn   [] Difficulty swallowing. [] Abdominal pain Genitourinary:  [] Chronic kidney disease   [] Difficult urination  [] Anuric   [] Blood in urine [] Frequent urination  [] Burning with urination   [] Hematuria Skin:  [] Rashes   [] Ulcers [] Wounds Psychological:  [] History of anxiety   []  History of major depression  []  Memory Difficulties      OBJECTIVE:   Physical Exam  BP 122/71 (BP Location: Left Arm, Patient Position: Sitting, Cuff Size: Normal)   Pulse 75   Resp 10   Ht 5' 9.5" (1.765 m)   Wt 185  lb (83.9 kg)   BMI 26.93 kg/m   Gen: WD/WN, NAD Head: [place]/AT, No temporalis wasting.  Ear/Nose/Throat: Hearing grossly intact, nares w/o erythema or drainage Eyes: PER, EOMI, sclera nonicteric.  Neck: Supple, no masses.  No JVD.  Pulmonary:  Good air movement, no use of accessory muscles.  Cardiac: RRR Vascular: scattered varicosities present bilaterally.  Mild venous stasis changes to the legs bilaterally.  2+ soft pitting edema Left leg slightly cool Vessel Right Left  Radial Palpable Palpable  Dorsalis Pedis Palpable Palpable  Posterior Tibial Palpable Palpable   Gastrointestinal: soft, non-distended. No guarding/no peritoneal signs.  Musculoskeletal: M/S 5/5 throughout.  No deformity or atrophy.  Neurologic: Pain and light touch intact in extremities.  Symmetrical.  Speech is fluent. Motor exam as listed above. Psychiatric: Judgment intact, Mood & affect appropriate for pt's clinical situation. Dermatologic: No Venous rashes. No Ulcers Noted.  No changes consistent with cellulitis. Lymph : No Cervical lymphadenopathy, no lichenification or skin changes of chronic lymphedema.       ASSESSMENT AND PLAN:  1. Atherosclerosis of native artery of left lower extremity with intermittent claudication (HCC) Recommend:  The patient has evidence of severe atherosclerotic changes of both lower extremities with rest pain that is associated with preulcerative changes and impending tissue loss of the foot.  This represents a limb threatening ischemia and places the patient at the risk for limb loss.  Patient should undergo angiography of the left lower extremities with the hope for intervention for limb salvage.  The risks and benefits as well as the alternative therapies was discussed in detail with the patient.  All questions were answered.  Patient agrees to proceed with angiography.  The patient will follow up with me in the office after the procedure.       2. Type 2 diabetes  mellitus without complication, without  long-term current use of insulin ([place]) Continue hypoglycemic medications as already ordered, these medications have been reviewed and there are no changes at this time.  Hgb A1C to be monitored as already arranged by primary service   3. Chronic venous insufficiency See below  4. Lymphedema No surgery or intervention at this point in time.  I have reviewed my discussion with the patient regarding venous insufficiency and why it causes symptoms. I have discussed with the patient the chronic skin changes that accompany venous insufficiency and the long term sequela such as ulceration. Patient will contnue wearing graduated compression stockings on a daily basis, as this has provided excellent control of his edema. The patient will put the stockings on first thing in the morning and removing them in the evening. The patient is reminded not to sleep in the stockings.  In addition, behavioral modification including elevation during the day will be initiated. Exercise is strongly encouraged.      No current facility-administered medications on file prior to visit.    Current Outpatient Medications on File Prior to Visit  Medication Sig Dispense Refill  . aspirin EC 81 MG tablet Take 81 mg by mouth daily.    . chlorthalidone (HYGROTON) 25 MG tablet Take 1 tablet (25 mg total) by mouth daily. 90 tablet 3  . Cholecalciferol (VITAMIN D3) 1000 UNITS CAPS Take 1,000 Units by mouth daily.     Marland Kitchen ENTRESTO 97-103 MG TAKE 1 TABLET TWICE A DAY (Patient taking differently: Take 1 tablet by mouth 2 (two) times a day. ) 180 tablet 3  . fentaNYL (DURAGESIC) 12 MCG/HR Place 1 patch onto the skin every 3 (three) days. 10 patch 0  . fluticasone (FLONASE) 50 MCG/ACT nasal spray Place 2 sprays into both nostrils daily.    . isosorbide mononitrate (IMDUR) 30 MG 24 hr tablet TAKE 1 TABLET BY MOUTH DAILY (Patient taking differently: Take 30 mg by mouth daily. ) 90 tablet 1  .  metFORMIN (GLUCOPHAGE) 500 MG tablet Take 1 tablet (500 mg total) by mouth 2 (two) times daily with a meal. 180 tablet 3  . metoprolol succinate (TOPROL-XL) 25 MG 24 hr tablet Take 1 tablet (25 mg) by mouth once daily at bedtime (Patient taking differently: Take 25 mg by mouth at bedtime. ) 90 tablet 3  . montelukast (SINGULAIR) 10 MG tablet Take 10 mg by mouth daily as needed (sneezing/allergies.).     Marland Kitchen Multiple Vitamin (MULTIVITAMIN WITH MINERALS) TABS tablet Take 1 tablet by mouth daily. One-A-Day Multivitamin    . omeprazole (PRILOSEC) 40 MG capsule Take 1 capsule (40 mg total) by mouth 2 (two) times daily as needed. (Patient taking differently: Take 40 mg by mouth 2 (two) times a day. ) 180 capsule 3  . simvastatin (ZOCOR) 40 MG tablet TAKE 1 TABLET AT BEDTIME (Patient taking differently: Take 40 mg by mouth at bedtime. ) 90 tablet 0  . spironolactone (ALDACTONE) 50 MG tablet Take 1 tablet (50 mg) by mouth once daily in the morning (Patient taking differently: Take 50 mg by mouth every morning. ) 90 tablet 3    There are no Patient Instructions on file for this visit. No follow-ups on file.   Kris Hartmann, NP  This note was completed with Sales executive.  Any errors are purely unintentional.

## 2019-08-23 NOTE — Progress Notes (Signed)
Patient ID: Howard [name], male   DOB: 04/26/1931, 85 y.o.   MRN: [id number]  [facility] PROGRESS NOTE  Howard [name] WUJ:[id number] DOB: 1931-11-17 DOA: 08/22/2019 PCP: Margo Common, PA  HPI/Subjective: Patient came in with severe pain in the toe.  He stated it is red and very painful.  Today it is feeling a little bit better than yesterday.  Objective: Vitals:   08/23/19 1346 08/23/19 1557  BP: (!) 105/51 103/67  Pulse: 66 68  Resp: 18 (!) 23  Temp: 98.4 F (36.9 C) 98 F (36.7 C)  SpO2: 98% 95%    Intake/Output Summary (Last 24 hours) at 08/23/2019 1612 Last data filed at 08/23/2019 0947 Gross per 24 hour  Intake 684.76 ml  Output 1225 ml  Net -540.24 ml   Filed Weights   08/22/19 2041 08/23/19 1557  Weight: 82.5 kg 82.5 kg    ROS: Review of Systems  Constitutional: Negative for chills and fever.  Eyes: Negative for blurred vision.  Respiratory: Negative for cough, shortness of breath and wheezing.   Cardiovascular: Negative for chest pain.  Gastrointestinal: Negative for abdominal pain, constipation, diarrhea, nausea and vomiting.  Genitourinary: Negative for dysuria.  Musculoskeletal: Positive for joint pain.  Neurological: Negative for dizziness and headaches.   Exam: Physical Exam  Constitutional: He is oriented to person, place, and time.  HENT:  Nose: No mucosal edema.  Mouth/Throat: No oropharyngeal exudate or posterior oropharyngeal edema.  Eyes: Pupils are equal, round, and reactive to light. Conjunctivae, EOM and lids are normal.  Neck: No JVD present. Carotid bruit is not present. No edema present. No thyroid mass and no thyromegaly present.  Cardiovascular: S1 normal and S2 normal. Exam reveals no gallop.  No murmur heard. Pulses:      Dorsalis pedis pulses are 2+ on the right side and 2+ on the left side.  Respiratory: No respiratory distress. He has no wheezes. He has no rhonchi. He has no rales.  GI: Soft. Bowel sounds are normal.  There is no abdominal tenderness.  Musculoskeletal:     Right shoulder: He exhibits no swelling.  Lymphadenopathy:    He has no cervical adenopathy.  Neurological: He is alert and oriented to person, place, and time. No cranial nerve deficit.  Skin: Skin is warm. Nails show no clubbing.  Psychiatric: He has a normal mood and affect.        Data Reviewed: Basic Metabolic Panel: Recent Labs  Lab 08/21/19 1310 08/22/19 1405 08/23/19 0555  NA 134* 134* 137  K 4.6 4.3 3.9  CL 101 100 104  CO2 23 24 24   GLUCOSE 125* 148* 132*  BUN 21 23 20   CREATININE 0.98 1.06 0.92  CALCIUM 9.6 9.5 9.0   Liver Function Tests: Recent Labs  Lab 08/21/19 1310  AST 21  ALT 18  ALKPHOS 48  BILITOT 0.4  PROT 7.6  ALBUMIN 4.4   CBC: Recent Labs  Lab 08/21/19 1310 08/22/19 1405 08/23/19 0555  WBC 6.3 6.2 4.9  NEUTROABS 2.9 2.8  --   HGB 13.7 13.6 13.2  HCT 42.0 41.0 39.9  MCV 82.4 81.7 81.1  PLT 265 272 228    CBG: Recent Labs  Lab 08/22/19 2054 08/23/19 0801 08/23/19 1144 08/23/19 1601  GLUCAP 115* 110* 128* 102*    Recent Results (from the past 240 hour(s))  SARS Coronavirus 2 [facility] order, Performed in [facility] hospital lab) Nasopharyngeal Nasopharyngeal Swab     Status: None   Collection Time:  08/22/19  6:35 PM   Specimen: Nasopharyngeal Swab  Result Value Ref Range Status   SARS Coronavirus 2 NEGATIVE NEGATIVE Final    Comment: (NOTE) If result is NEGATIVE SARS-CoV-2 target nucleic acids are NOT DETECTED. The SARS-CoV-2 RNA is generally detectable in upper and lower  respiratory specimens during the acute phase of infection. The lowest  concentration of SARS-CoV-2 viral copies this assay can detect is 250  copies / mL. A negative result does not preclude SARS-CoV-2 infection  and should not be used as the sole basis for treatment or other  patient management decisions.  A negative result may occur with  improper specimen collection / handling, submission  of specimen other  than nasopharyngeal swab, presence of viral mutation(s) within the  areas targeted by this assay, and inadequate number of viral copies  (<250 copies / mL). A negative result must be combined with clinical  observations, patient history, and epidemiological information. If result is POSITIVE SARS-CoV-2 target nucleic acids are DETECTED. The SARS-CoV-2 RNA is generally detectable in upper and lower  respiratory specimens dur ing the acute phase of infection.  Positive  results are indicative of active infection with SARS-CoV-2.  Clinical  correlation with patient history and other diagnostic information is  necessary to determine patient infection status.  Positive results do  not rule out bacterial infection or co-infection with other viruses. If result is PRESUMPTIVE POSTIVE SARS-CoV-2 nucleic acids MAY BE PRESENT.   A presumptive positive result was obtained on the submitted specimen  and confirmed on repeat testing.  While 2019 novel coronavirus  (SARS-CoV-2) nucleic acids may be present in the submitted sample  additional confirmatory testing may be necessary for epidemiological  and / or clinical management purposes  to differentiate between  SARS-CoV-2 and other Sarbecovirus currently known to infect humans.  If clinically indicated additional testing with an alternate test  methodology [phone]) is advised. The SARS-CoV-2 RNA is generally  detectable in upper and lower respiratory sp ecimens during the acute  phase of infection. The expected result is Negative. Fact Sheet for Patients:  StrictlyIdeas.no Fact Sheet for Healthcare Providers: BankingDealers.co.za This test is not yet approved or cleared by the [place] FDA and has been authorized for detection and/or diagnosis of SARS-CoV-2 by FDA under an Emergency Use Authorization (EUA).  This EUA will remain in effect (meaning this test can be used) for  the duration of the COVID-19 declaration under Section 564(b)(1) of the Act, 21 U.S.C. section 360bbb-3(b)(1), unless the authorization is terminated or revoked sooner. Performed at [facility], [address] [place], [place] [address]   Culture, blood (routine x 2)     Status: None (Preliminary result)   Collection Time: 08/22/19  7:52 PM   Specimen: BLOOD  Result Value Ref Range Status   Specimen Description BLOOD RIGHT FOREARM  Final   Special Requests   Final    BOTTLES DRAWN AEROBIC AND ANAEROBIC Blood Culture adequate volume   Culture   Final    NO GROWTH < 12 HOURS Performed at [facility], [address], [place], [place] [address]    Report Status PENDING  Incomplete  Culture, blood (routine x 2)     Status: None (Preliminary result)   Collection Time: 08/22/19  7:52 PM   Specimen: BLOOD  Result Value Ref Range Status   Specimen Description BLOOD LEFT ANTECUBITAL  Final   Special Requests   Final    BOTTLES DRAWN AEROBIC AND ANAEROBIC Blood Culture  results may not be optimal due to an excessive volume of blood received in culture bottles   Culture   Final    NO GROWTH < 12 HOURS Performed at [facility] [address] [place], [place] [address]    Report Status PENDING  Incomplete      Scheduled Meds: . [MAR Hold] aspirin EC  81 mg Oral Daily  . [MAR Hold] fentaNYL  1 patch Transdermal Q72H  . fentaNYL      . [MAR Hold] fluticasone  2 spray Each Nare Daily  . heparin      . [MAR Hold] insulin aspart  0-15 Units Subcutaneous TID WC  . [MAR Hold] insulin aspart  0-5 Units Subcutaneous QHS  . [MAR Hold] isosorbide mononitrate  30 mg Oral Daily  . [MAR Hold] metoprolol succinate  25 mg Oral QHS  . midazolam      . [MAR Hold] pantoprazole  40 mg Oral Daily  . [MAR Hold] sacubitril-valsartan  1 tablet Oral BID  . [MAR Hold] simvastatin  40 mg Oral QHS  . [MAR Hold] sodium chloride flush  3 mL Intravenous Q12H  . [MAR Hold]  spironolactone  50 mg Oral q morning - 10a   Continuous Infusions: . [MAR Hold] sodium chloride 250 mL (08/23/19 1432)  . [MAR Hold] cefTRIAXone (ROCEPHIN)  IV 2 g (08/23/19 1433)  . heparin 1,000 Units/hr (08/23/19 0557)    Assessment/Plan:  1. Ischemic toe versus cellulitis.  Patient to have angiogram today to evaluate blood supply.  Patient on heparin drip.  Continue antibiotics. 2. Type 2 diabetes mellitus.  Hold Glucophage with angiogram.  Sliding scale coverage. 3. Chronic systolic congestive heart failure on Entresto and metoprolol and imdur. 4. Peripheral vascular disease with ischemic left great toe.  Angiogram for today.  Code Status:     Code Status Orders  (From admission, onward)         Start     Ordered   08/22/19 1826  Full code  Continuous     08/22/19 1825        Code Status History    Date Active Date Inactive Code Status Order ID Comments User Context   07/09/2019 1057 07/09/2019 1725 Full Code [id number]  Deboraha Sprang, MD Inpatient   08/26/2014 1001 08/26/2014 1543 Full Code [id number]  Martinique, Peter M, MD Inpatient   06/07/2014 1324 06/07/2014 2149 Full Code [id number]  [name], MD Inpatient   05/30/2014 0838 05/31/2014 0337 Full Code [id number]  Marybelle Killings, MD [facility]   Advance Care Planning Activity    Advance Directive Documentation     Most Recent Value  Type of Advance Directive  Healthcare Power of Attorney, Living will  Pre-existing out of facility DNR order (yellow form or pink MOST form)  -  "MOST" Form in Place?  -     Family Communication: Left message for the patient's wife Disposition Plan: To be determined  Consultants:  Vascular surgery  Procedures:  Angiogram lower extremities  Antibiotics:  Antibiotics changed to Rocephin  Time spent: 28 minutes  [place]

## 2019-08-23 NOTE — Progress Notes (Signed)
Informed Mickel Baas, RN to restart heparin at previous prescribed rate at 1900, no bolus per Dr. Lucky Cowboy.

## 2019-08-23 NOTE — Plan of Care (Signed)
Patient denies pain and is calm and cooperative. Patient's wife is in room. IV team has been called to start a new peripheral IV since the left PIV was not working for IV Rocephin.  Marry Guan, RN 08/23/2019 7:36 PM   Problem: Education: Goal: Knowledge of General Education information will improve Description: Including pain rating scale, medication(s)/side effects and non-pharmacologic comfort measures Outcome: Progressing   Problem: Health Behavior/Discharge Planning: Goal: Ability to manage health-related needs will improve Outcome: Progressing   Problem: Clinical Measurements: Goal: Ability to maintain clinical measurements within normal limits will improve Outcome: Progressing Goal: Will remain free from infection Outcome: Progressing Goal: Diagnostic test results will improve Outcome: Progressing Goal: Respiratory complications will improve Outcome: Progressing Goal: Cardiovascular complication will be avoided Outcome: Progressing   Problem: Activity: Goal: Risk for activity intolerance will decrease Outcome: Progressing   Problem: Nutrition: Goal: Adequate nutrition will be maintained Outcome: Progressing   Problem: Coping: Goal: Level of anxiety will decrease Outcome: Progressing   Problem: Elimination: Goal: Will not experience complications related to bowel motility Outcome: Progressing Goal: Will not experience complications related to urinary retention Outcome: Progressing   Problem: Pain Managment: Goal: General experience of comfort will improve Outcome: Progressing   Problem: Safety: Goal: Ability to remain free from injury will improve Outcome: Progressing   Problem: Skin Integrity: Goal: Risk for impaired skin integrity will decrease Outcome: Progressing

## 2019-08-23 NOTE — Consult Note (Signed)
[place] for Heparin Drip Indication: Peripheral Vascular Disease  Allergies  Allergen Reactions  . Sulfa Antibiotics Rash    Patient Measurements: Height: 5' 9.5" (176.5 cm) Weight: 181 lb 14.1 oz (82.5 kg) IBW/kg (Calculated) : 71.85 Heparin Dosing Weight: 82.5 kg  Vital Signs: Temp: 98.4 F (36.9 C) (08/31 1346) Temp Source: Oral (08/31 1346) BP: 105/51 (08/31 1346) Pulse Rate: 66 (08/31 1346)  Labs: Recent Labs    08/21/19 1310 08/22/19 1405 08/22/19 2204 08/23/19 0555 08/23/19 1406  HGB 13.7 13.6  --  13.2  --   HCT 42.0 41.0  --  39.9  --   PLT 265 272  --  228  --   APTT  --   --  48*  --   --   LABPROT  --   --  14.3 13.7  --   INR  --   --  1.1 1.1  --   HEPARINUNFRC  --   --   --  0.34 0.18*  CREATININE 0.98 1.06  --  0.92  --     Estimated Creatinine Clearance: 58.6 mL/min (by C-G formula based on SCr of 0.92 mg/dL).   Medical History: Past Medical History:  Diagnosis Date  . Arthritis   . Bell palsy   . Cancer [facility])    prostate and skin  . Chronic combined systolic and diastolic CHF, NYHA class 1 ([place])    a. 07/2014 Echo: EF 35-40%, Gr 1 DD.  Marland Kitchen Complete heart block ([place])    a. 11/2010 s/p SJM 2210 [facility] DC PPM, ser# [id number].  . Depression   . Diabetes mellitus without complication ([place])   . Fall 11-10-14  . GERD (gastroesophageal reflux disease)   . History of prostate cancer   . Hyperlipidemia   . Hypertension   . LBBB (left bundle branch block)   . Left-sided Bell's palsy   . Lung cancer ([place]) 2016  . NICM (nonischemic cardiomyopathy) ([place])    a. 07/2014 Echo: EF 35-40%, mid-apicalanteroseptal DK, Gr 1 DD, mild-mod dil LA.  . Non-obstructive CAD    a. 07/2014 Abnl MV;  b. 08/2014 Cath: LM nl, LAD 30p, RI 40p, LCX nl, OM1 40, RCA dominant 30p, 70d-->Med Rx.  Marland Kitchen Poor balance   . Presence of permanent cardiac pacemaker   . Sleep apnea    a. cpap  . Vertigo   . WPW (Wolff-Parkinson-White syndrome)     a. S/P RFCA 1991.    Medications:  Medications Prior to Admission  Medication Sig Dispense Refill Last Dose  . aspirin EC 81 MG tablet Take 81 mg by mouth daily.   08/22/2019 at 0800  . chlorthalidone (HYGROTON) 25 MG tablet Take 1 tablet (25 mg total) by mouth daily. 90 tablet 3 08/22/2019 at 0800  . Cholecalciferol (VITAMIN D3) 1000 UNITS CAPS Take 1,000 Units by mouth daily.    08/22/2019 at 0800  . doxycycline (VIBRAMYCIN) 100 MG capsule Take 1 capsule (100 mg total) by mouth 2 (two) times daily for 7 days. 14 capsule 0 08/22/2019 at 0800  . ENTRESTO 97-103 MG TAKE 1 TABLET TWICE A DAY (Patient taking differently: Take 1 tablet by mouth 2 (two) times a day. ) 180 tablet 3 08/22/2019 at 0800  . fentaNYL (DURAGESIC) 12 MCG/HR Place 1 patch onto the skin every 3 (three) days. 10 patch 0   . fluticasone (FLONASE) 50 MCG/ACT nasal spray Place 2 sprays into both nostrils daily.   08/22/2019 at 0800  .  isosorbide mononitrate (IMDUR) 30 MG 24 hr tablet TAKE 1 TABLET BY MOUTH DAILY (Patient taking differently: Take 30 mg by mouth daily. ) 90 tablet 1 08/21/2019 at 2100  . metFORMIN (GLUCOPHAGE) 500 MG tablet Take 1 tablet (500 mg total) by mouth 2 (two) times daily with a meal. 180 tablet 3 08/22/2019 at 0800  . metoprolol succinate (TOPROL-XL) 25 MG 24 hr tablet Take 1 tablet (25 mg) by mouth once daily at bedtime (Patient taking differently: Take 25 mg by mouth at bedtime. ) 90 tablet 3 08/21/2019 at 2100  . montelukast (SINGULAIR) 10 MG tablet Take 10 mg by mouth daily as needed (sneezing/allergies.).    Unknown at PRN  . Multiple Vitamin (MULTIVITAMIN WITH MINERALS) TABS tablet Take 1 tablet by mouth daily. One-A-Day Multivitamin   08/22/2019 at 0800  . mupirocin ointment (BACTROBAN) 2 % Apply to affected area 3 times daily 22 g 0   . omeprazole (PRILOSEC) 40 MG capsule Take 1 capsule (40 mg total) by mouth 2 (two) times daily as needed. (Patient taking differently: Take 40 mg by mouth 2 (two) times a  day. ) 180 capsule 3 08/22/2019 at 0800  . simvastatin (ZOCOR) 40 MG tablet TAKE 1 TABLET AT BEDTIME (Patient taking differently: Take 40 mg by mouth at bedtime. ) 90 tablet 0 08/21/2019 at 2100  . spironolactone (ALDACTONE) 50 MG tablet Take 1 tablet (50 mg) by mouth once daily in the morning (Patient taking differently: Take 50 mg by mouth every morning. ) 90 tablet 3 08/22/2019 at 0800   Scheduled:  . aspirin EC  81 mg Oral Daily  . fentaNYL  1 patch Transdermal Q72H  . fluticasone  2 spray Each Nare Daily  . insulin aspart  0-15 Units Subcutaneous TID WC  . insulin aspart  0-5 Units Subcutaneous QHS  . isosorbide mononitrate  30 mg Oral Daily  . metoprolol succinate  25 mg Oral QHS  . pantoprazole  40 mg Oral Daily  . sacubitril-valsartan  1 tablet Oral BID  . simvastatin  40 mg Oral QHS  . sodium chloride flush  3 mL Intravenous Q12H  . spironolactone  50 mg Oral q morning - 10a   Infusions:  . sodium chloride 250 mL (08/23/19 1432)  . cefTRIAXone (ROCEPHIN)  IV 2 g (08/23/19 1433)  . heparin 1,000 Units/hr (08/23/19 0557)   PRN: sodium chloride, acetaminophen **OR** acetaminophen, montelukast, ondansetron **OR** ondansetron (ZOFRAN) IV, sodium chloride flush, traZODone Anti-infectives (From admission, onward)   Start     Dose/Rate Route Frequency Ordered Stop   08/24/19 1100  vancomycin (VANCOCIN) 2,000 mg in sodium chloride 0.9 % 500 mL IVPB  Status:  Discontinued     2,000 mg 250 mL/hr over 120 Minutes Intravenous Every 36 hours 08/22/19 2147 08/23/19 1159   08/23/19 1400  cefTRIAXone (ROCEPHIN) 2 g in sodium chloride 0.9 % 100 mL IVPB     2 g 200 mL/hr over 30 Minutes Intravenous Every 24 hours 08/23/19 1159     08/23/19 0600  meropenem (MERREM) 1 g in sodium chloride 0.9 % 100 mL IVPB  Status:  Discontinued     1 g 200 mL/hr over 30 Minutes Intravenous Every 8 hours 08/22/19 2147 08/23/19 1159   08/22/19 1830  vancomycin (VANCOCIN) 2,000 mg in sodium chloride 0.9 % 500 mL  IVPB     2,000 mg 250 mL/hr over 120 Minutes Intravenous  Once 08/22/19 1825 08/23/19 0808   08/22/19 1830  meropenem (MERREM) 1 g in  sodium chloride 0.9 % 100 mL IVPB     1 g 200 mL/hr over 30 Minutes Intravenous  Once 08/22/19 1825 08/22/19 2226   08/22/19 1815  clindamycin (CLEOCIN) IVPB 600 mg     600 mg 100 mL/hr over 30 Minutes Intravenous  Once 08/22/19 1809 08/22/19 1901      Assessment: Pharmacy has been consulted for Heparin Drip initiation on 85yo patient admitted with cellulitis of left foot/toes. Patient scheduled for angiography of the left lower limb in the AM. Does not take any anticoagulant therapy PTA. Will initiate therapy immediately. Baseline labs have been ordered and are pending.  08/31 @ 0500 HL 0.34 therapeutic. Will continue current rate and will recheck HL @ 1400  Goal of Therapy:  Heparin level 0.3-0.7 units/ml Monitor platelets by anticoagulation protocol: Yes   Plan:  08/31 @ 1406 HL 0.18 subtherapeutic. Patient going for angiogram and Heparin to be stopped in next 15-30 min per discussion with RN. If patient to resume Heparin after procedure would consider starting at 1250 untis/hr CBC WNL will continue to monitor.  Chinita Greenland PharmD Clinical Pharmacist 08/23/2019

## 2019-08-23 NOTE — Progress Notes (Signed)
Patient is using the urinal and told IV RN to come back. RN made aware.

## 2019-08-23 NOTE — Consult Note (Signed)
ANTICOAGULATION CONSULT NOTE - Initial Consult  Pharmacy Consult for Heparin Drip Indication: Peripheral Vascular Disease  Allergies  Allergen Reactions  . Sulfa Antibiotics Rash    Patient Measurements: Height: 5' 9.5" (176.5 cm) Weight: 181 lb 14.1 oz (82.5 kg) IBW/kg (Calculated) : 71.85 Heparin Dosing Weight: 82.5 kg  Vital Signs: Temp: 97.8 F (36.6 C) (08/31 0424) Temp Source: Oral (08/31 0424) BP: 120/58 (08/31 0424) Pulse Rate: 73 (08/31 0424)  Labs: Recent Labs    08/21/19 1310 08/22/19 1405 08/22/19 2204 08/23/19 0555  HGB 13.7 13.6  --  13.2  HCT 42.0 41.0  --  39.9  PLT 265 272  --  228  APTT  --   --  48*  --   LABPROT  --   --  14.3 13.7  INR  --   --  1.1 1.1  HEPARINUNFRC  --   --   --  0.34  CREATININE 0.98 1.06  --  0.92    Estimated Creatinine Clearance: 58.6 mL/min (by C-G formula based on SCr of 0.92 mg/dL).   Medical History: Past Medical History:  Diagnosis Date  . Arthritis   . Bell palsy   . Cancer [facility])    prostate and skin  . Chronic combined systolic and diastolic CHF, NYHA class 1 ([place])    a. 07/2014 Echo: EF 35-40%, Gr 1 DD.  Marland Kitchen Complete heart block ([place])    a. 11/2010 s/p SJM 2210 [facility] DC PPM, ser# [id number].  . Depression   . Diabetes mellitus without complication ([place])   . Fall 11-10-14  . GERD (gastroesophageal reflux disease)   . History of prostate cancer   . Hyperlipidemia   . Hypertension   . LBBB (left bundle branch block)   . Left-sided Bell's palsy   . Lung cancer ([place]) 2016  . NICM (nonischemic cardiomyopathy) ([place])    a. 07/2014 Echo: EF 35-40%, mid-apicalanteroseptal DK, Gr 1 DD, mild-mod dil LA.  . Non-obstructive CAD    a. 07/2014 Abnl MV;  b. 08/2014 Cath: LM nl, LAD 30p, RI 40p, LCX nl, OM1 40, RCA dominant 30p, 70d-->Med Rx.  Marland Kitchen Poor balance   . Presence of permanent cardiac pacemaker   . Sleep apnea    a. cpap  . Vertigo   . WPW (Wolff-Parkinson-White syndrome)    a. S/P RFCA 1991.    Medications:   Medications Prior to Admission  Medication Sig Dispense Refill Last Dose  . aspirin EC 81 MG tablet Take 81 mg by mouth daily.   08/22/2019 at 0800  . chlorthalidone (HYGROTON) 25 MG tablet Take 1 tablet (25 mg total) by mouth daily. 90 tablet 3 08/22/2019 at 0800  . Cholecalciferol (VITAMIN D3) 1000 UNITS CAPS Take 1,000 Units by mouth daily.    08/22/2019 at 0800  . doxycycline (VIBRAMYCIN) 100 MG capsule Take 1 capsule (100 mg total) by mouth 2 (two) times daily for 7 days. 14 capsule 0 08/22/2019 at 0800  . ENTRESTO 97-103 MG TAKE 1 TABLET TWICE A DAY (Patient taking differently: Take 1 tablet by mouth 2 (two) times a day. ) 180 tablet 3 08/22/2019 at 0800  . fentaNYL (DURAGESIC) 12 MCG/HR Place 1 patch onto the skin every 3 (three) days. 10 patch 0   . fluticasone (FLONASE) 50 MCG/ACT nasal spray Place 2 sprays into both nostrils daily.   08/22/2019 at 0800  . isosorbide mononitrate (IMDUR) 30 MG 24 hr tablet TAKE 1 TABLET BY MOUTH DAILY (Patient taking differently: Take 30  mg by mouth daily. ) 90 tablet 1 08/21/2019 at 2100  . metFORMIN (GLUCOPHAGE) 500 MG tablet Take 1 tablet (500 mg total) by mouth 2 (two) times daily with a meal. 180 tablet 3 08/22/2019 at 0800  . metoprolol succinate (TOPROL-XL) 25 MG 24 hr tablet Take 1 tablet (25 mg) by mouth once daily at bedtime (Patient taking differently: Take 25 mg by mouth at bedtime. ) 90 tablet 3 08/21/2019 at 2100  . montelukast (SINGULAIR) 10 MG tablet Take 10 mg by mouth daily as needed (sneezing/allergies.).    Unknown at PRN  . Multiple Vitamin (MULTIVITAMIN WITH MINERALS) TABS tablet Take 1 tablet by mouth daily. One-A-Day Multivitamin   08/22/2019 at 0800  . mupirocin ointment (BACTROBAN) 2 % Apply to affected area 3 times daily 22 g 0   . omeprazole (PRILOSEC) 40 MG capsule Take 1 capsule (40 mg total) by mouth 2 (two) times daily as needed. (Patient taking differently: Take 40 mg by mouth 2 (two) times a day. ) 180 capsule 3 08/22/2019 at 0800  .  simvastatin (ZOCOR) 40 MG tablet TAKE 1 TABLET AT BEDTIME (Patient taking differently: Take 40 mg by mouth at bedtime. ) 90 tablet 0 08/21/2019 at 2100  . spironolactone (ALDACTONE) 50 MG tablet Take 1 tablet (50 mg) by mouth once daily in the morning (Patient taking differently: Take 50 mg by mouth every morning. ) 90 tablet 3 08/22/2019 at 0800   Scheduled:  . aspirin EC  81 mg Oral Daily  . fentaNYL  1 patch Transdermal Q72H  . fluticasone  2 spray Each Nare Daily  . insulin aspart  0-15 Units Subcutaneous TID WC  . insulin aspart  0-5 Units Subcutaneous QHS  . isosorbide mononitrate  30 mg Oral Daily  . metFORMIN  500 mg Oral BID WC  . metoprolol succinate  25 mg Oral QHS  . pantoprazole  40 mg Oral Daily  . sacubitril-valsartan  1 tablet Oral BID  . simvastatin  40 mg Oral QHS  . sodium chloride flush  3 mL Intravenous Q12H  . spironolactone  50 mg Oral q morning - 10a   Infusions:  . sodium chloride Stopped (08/23/19 0549)  . heparin Stopped (08/23/19 0557)  . meropenem (MERREM) IV Stopped (08/23/19 0557)  . [START ON 08/24/2019] vancomycin     PRN: sodium chloride, acetaminophen **OR** acetaminophen, montelukast, ondansetron **OR** ondansetron (ZOFRAN) IV, sodium chloride flush, traZODone Anti-infectives (From admission, onward)   Start     Dose/Rate Route Frequency Ordered Stop   08/24/19 1100  vancomycin (VANCOCIN) 2,000 mg in sodium chloride 0.9 % 500 mL IVPB     2,000 mg 250 mL/hr over 120 Minutes Intravenous Every 36 hours 08/22/19 2147     08/23/19 0600  meropenem (MERREM) 1 g in sodium chloride 0.9 % 100 mL IVPB     1 g 200 mL/hr over 30 Minutes Intravenous Every 8 hours 08/22/19 2147     08/22/19 1830  vancomycin (VANCOCIN) 2,000 mg in sodium chloride 0.9 % 500 mL IVPB     2,000 mg 250 mL/hr over 120 Minutes Intravenous  Once 08/22/19 1825 08/23/19 0056   08/22/19 1830  meropenem (MERREM) 1 g in sodium chloride 0.9 % 100 mL IVPB     1 g 200 mL/hr over 30 Minutes  Intravenous  Once 08/22/19 1825 08/22/19 2226   08/22/19 1815  clindamycin (CLEOCIN) IVPB 600 mg     600 mg 100 mL/hr over 30 Minutes Intravenous  Once 08/22/19  1809 08/22/19 1901      Assessment: Pharmacy has been consulted for Heparin Drip initiation on 85yo patient admitted with cellulitis of left foot/toes. Patient scheduled for angiography of the left lower limb in the AM. Does not take any anticoagulant therapy PTA. Will initiate therapy immediately. Baseline labs have been ordered and are pending.  Goal of Therapy:  Heparin level 0.3-0.7 units/ml Monitor platelets by anticoagulation protocol: Yes   Plan:  08/31 @ 0500 HL 0.34 therapeutic. Will continue current rate and will recheck HL @ 1400, CBC WNL will continue to monitor.  Tobie Lords, PharmD, BCPS Clinical Pharmacist 08/23/2019,7:23 AM

## 2019-08-23 NOTE — Progress Notes (Signed)
*  PRELIMINARY RESULTS* Echocardiogram 2D Echocardiogram has been performed.  Howard [name] 08/23/2019, 3:49 PM

## 2019-08-23 NOTE — H&P (Signed)
[facility] History & Physical Update  The patient was interviewed and re-examined.  The patient's previous History and Physical has been reviewed and is unchanged.  There is no change in the plan of care. We plan to proceed with the scheduled procedure.  Leotis Pain, MD  08/23/2019, 4:14 PM

## 2019-08-23 NOTE — Op Note (Signed)
[name]  Percutaneous Study/Intervention Procedural Note   Date of Surgery: 08/23/2019  Surgeon(s):DEW,JASON    Assistants:none  Pre-operative Diagnosis: PAD with rest [name] and pre-ulcerative changes left foot  Post-operative diagnosis:  Same with left iliac occlusion associated with abdominal aortic aneurysm, right renal artery stenosis, left SFA stenosis, right iliac stenosis, and bilateral femoral artery disease  Procedure(s) Performed:             1.  Ultrasound guidance for vascular access right femoral artery             2.  Catheter placement into aorta from right femoral approach             3.  Aortogram and selective left lower extremity angiogram             4.  Percutaneous transluminal angioplasty of right external iliac artery with 7 mm diameter by 6 cm length Lutonix drug-coated angioplasty balloon             5. StarClose closure device right femoral artery  EBL: 3 cc  Contrast: 60 cc  Fluoro Time: 2.5 minutes  Moderate Conscious Sedation Time: approximately 30 minutes using 2 mg of Versed and 50 mcg of Fentanyl              Indications:  Patient is a 85 y.o.male with rest [name] and now with some pre-ulcerative changes and cellulitis of his left great toe. The patient has noninvasive study showing a markedly reduced left ABI. The patient is brought in for angiography for further evaluation and potential treatment.  Due to the limb threatening nature of the situation, angiogram was performed for attempted limb salvage. The patient is aware that if the procedure fails, amputation would be expected.  The patient also understands that even with successful revascularization, amputation may still be required due to the severity of the situation.  Risks and benefits are discussed and informed consent is obtained.   Procedure:  The patient was identified and appropriate procedural time out was performed.  The patient was then placed supine on the  table and prepped and draped in the usual sterile fashion. Moderate conscious sedation was administered during a face to face encounter with the patient throughout the procedure with my supervision of the RN administering medicines and monitoring the patient's vital signs, pulse oximetry, telemetry and mental status throughout from the start of the procedure until the patient was taken to the recovery room. Ultrasound was used to evaluate the right common femoral artery.  It was patent .  A digital ultrasound image was acquired.  A Seldinger needle was used to access the right common femoral artery under direct ultrasound guidance and a permanent image was performed.  A 0.035 J wire was advanced but met resistance in the right external iliac artery and a 5Fr sheath was placed.   Imaging through the 5 French sheath showed about a 85 to 90% highly calcified stenosis in the right external iliac artery.  I was able to cross this lesion with a mild amount of difficulty with an advantage wire.  I went ahead and heparinize the patient and performed angioplasty of this lesion to allow getting up into the aorta and potentially allowing left leg intervention.  A 7 mm diameter by 6 cm length Lutonix drug-coated angioplasty balloon was inflated to 12 atm in the right external iliac artery for 1 minute.  Completion imaging still showed a moderate stenosis more in the 50%  range but this allowed getting a catheter up into the aorta for further evaluation and treatment.  Pigtail catheter was placed into the aorta and an AP aortogram was performed. This demonstrated a moderate sized abdominal aortic aneurysm.  There was moderate stenosis of the right common iliac artery in the 50 to 60% range.  The left iliac artery had a flush occlusion at its origin.  It was difficult to discern whether or not this was related to a thrombosed aneurysm or atherosclerotic disease.  The left renal artery was patent and single.  There appeared to be 2  right renal arteries with the more dominant one having a very high-grade stenosis in being the lower renal artery.  The SMA came across and made the superior renal artery opacification more difficult on the right. I then pulled the pigtail catheter down to the distal aorta and first perform pelvic obliques and then evaluated the left lower extremity.  The left lower extremity imaging was somewhat poor due to the iliac occlusion and the slow perfusion time due to poor inflow.  The left iliac occlusion appeared to reconstitute at the left common femoral artery which was diseased.  The SFA appeared to have either a high-grade stenosis or an occlusion in the mid to distal segment that was highly calcific.  The popliteal artery seem to normalize and there was a tibioperoneal trunk with the peroneal artery and posterior tibial artery distally although it was difficult to see if these went all the way to the foot due to the poor inflow.  The patient will need a much more complex repair including aneurysm repair, possible renal artery stent, possible femoral to femoral bypass if we cannot cross the left iliac occlusion, left SFA intervention, and likely femoral endarterectomies.  I elected to terminate the procedure.  Imaging of the right femoral artery was done through the right femoral sheath which showed calcific disease particularly at the distal common femoral artery and proximal SFA that appeared to be greater than 70%.  The sheath was removed and StarClose closure device was deployed in the right femoral artery with excellent hemostatic result. The patient was taken to the recovery room in stable condition having tolerated the procedure well.  Findings:               Aortogram:  This demonstrated a moderate sized abdominal aortic aneurysm.  There was moderate stenosis of the right common iliac artery in the 50 to 60% range.  The right external iliac artery had a 85 to 90% stenosis that was improved but not  entirely resolved with angioplasty.  The left iliac artery had a flush occlusion at its origin.  It was difficult to discern whether or not this was related to a thrombosed aneurysm or atherosclerotic disease.  The left renal artery was patent and single.  There appeared to be 2 right renal arteries with the more dominant one having a very high-grade stenosis in being the lower renal artery.  The SMA came across and made the superior renal artery opacification more difficult on the right.             Right lower Extremity:  The left lower extremity imaging was somewhat poor due to the iliac occlusion and the slow perfusion time due to poor inflow.  The left iliac occlusion appeared to reconstitute at the left common femoral artery which was diseased.  The SFA appeared to have either a high-grade stenosis or an occlusion in the mid to  distal segment that was highly calcific.  The popliteal artery seem to normalize and there was a tibioperoneal trunk with the peroneal artery and posterior tibial artery distally although it was difficult to see if these went all the way to the foot due to the poor inflow.   Disposition: Patient was taken to the recovery room in stable condition having tolerated the procedure well.  Complications: None  Howard [name] 08/23/2019 5:04 PM   This note was created with Dragon Medical transcription system. Any errors in dictation are purely unintentional.

## 2019-08-23 NOTE — Evaluation (Signed)
Physical Therapy Evaluation Patient Details Name: Howard [name] MRN: [id number] DOB: September 04, 1931 Today's Date: 08/23/2019   History of Present Illness  pt is an 85 yo male who presented to ED for great toe pain and swelling, exam consistent with cellulitis, PMH includes PAD, DM, CHF, vertigo, HTN  Clinical Impression  Pt is an 85 yo male admitted for above. Pt in bed upon arrival with wife present and agreed to participate with therapy. Pt required min guard for functional mobility and presents with decreased balance, strength and endurance. Pt and wife report pt is a limited household ambulator secondary to decreased endurance and balance. Pt reports using a scooter in stores and for community ambulation. Pts wife reports she has mobility and balance deficits herself that limits some of her ability to perform all caregiver duties. Pt would benefit from skilled acute therapy to address strength, endurance and balance deficits. Pt would benefit from home health PT following discharge from hospital to continue to improve deficits to decrease caregiver burden and improve functional mobility. Pt and spouse educated on benefits of home health and both agreed that pt would benefit.     Follow Up Recommendations Home health PT    Equipment Recommendations  Rolling walker with 5" wheels    Recommendations for Other Services       Precautions / Restrictions Precautions Precautions: Fall Restrictions Weight Bearing Restrictions: No      Mobility  Bed Mobility Overal bed mobility: Needs Assistance Bed Mobility: Supine to Sit     Supine to sit: Min guard;HOB elevated     General bed mobility comments: pt utilized increased time and effort, bed rails, no physical assist needed to sit EOB, pt reported mild lightheadedness upon initial sitting with symptoms resolving quickly  Transfers Overall transfer level: Needs assistance Equipment used: Rolling walker (2 wheeled) Transfers: Sit to/from  Stand Sit to Stand: Min guard         General transfer comment: pt able to perform STS transfer with RW, required cuing for hand placement, min guard for safety, pt reliant on UE support for balance  Ambulation/Gait Ambulation/Gait assistance: Min guard Gait Distance (Feet): 50 Feet Assistive device: Rolling walker (2 wheeled) Gait Pattern/deviations: Step-through pattern;Wide base of support;Trunk flexed Gait velocity: decreased   General Gait Details: pt ambulated with step through pattern with heavy reliance on UE support from walker, pt reports gait pattern similar to his normal  Stairs            Wheelchair Mobility    Modified Rankin (Stroke Patients Only)       Balance Overall balance assessment: Needs assistance Sitting-balance support: Feet supported Sitting balance-Leahy Scale: Fair       Standing balance-Leahy Scale: Fair Standing balance comment: pt reliant on UE support from RW to maintain balance                             Pertinent Vitals/Pain Pain Assessment: Faces Faces Pain Scale: Hurts a little bit Pain Location: L toe, reports 10/10 pain if someone touches toe Pain Descriptors / Indicators: Aching Pain Intervention(s): Limited activity within patient's tolerance;Monitored during session    [place] expects to be discharged to:: Private residence Living Arrangements: Spouse/significant other Available Help at Discharge: Available 24 hours/day;Family;Available PRN/intermittently(son and daughter in law available PRN) Type of Home: House Home Access: Stairs to enter Entrance Stairs-Rails: Can reach both Entrance Stairs-Number of Steps: 3 Home Layout: Laundry or  work area in basement;One level Home Equipment: Grab bars - tub/shower;Cane - single point;Shower seat      Prior Function Level of Independence: Independent with assistive device(s)         Comments: pt and wife report pt independent with ADLs,  uses a shower chair, pt reports being limited household ambulator due to balance and endurance deficits, wife reports pt has had 5 back surgeries and pt is very sedentary, pt reports unable to walk on inclines and struggles with stairs due to balance deficits and uses a scooter for community ambulation in stores, wife reports having some mobility concerns as well especially her balance, wife reports cooking is a problem for them and they mostly eat out     Hand Dominance        Extremity/Trunk Assessment   Upper Extremity Assessment Upper Extremity Assessment: Generalized weakness    Lower Extremity Assessment Lower Extremity Assessment: Generalized weakness    Cervical / Trunk Assessment Cervical / Trunk Assessment: Kyphotic  Communication   Communication: HOH(has hearing aid)  Cognition Arousal/Alertness: Awake/alert Behavior During Therapy: WFL for tasks assessed/performed Overall Cognitive Status: Within Functional Limits for tasks assessed                                        General Comments      Exercises Total Joint Exercises Long Arc Quad: AROM;Both;10 reps Marching in Standing: AROM;Both;10 reps   Assessment/Plan    PT Assessment Patient needs continued PT services  PT Problem List Decreased strength;Decreased mobility;Decreased activity tolerance;Decreased balance;Pain       PT Treatment Interventions DME instruction;Therapeutic exercise;Gait training;Balance training;Stair training;Neuromuscular re-education;Functional mobility training;Therapeutic activities;Patient/family education    PT Goals (Current goals can be found in the Care Plan section)  Acute Rehab PT Goals Patient Stated Goal: return home PT Goal Formulation: With patient Time For Goal Achievement: 08/30/19 Potential to Achieve Goals: Good    Frequency Min 2X/week   Barriers to discharge Decreased caregiver support wife reports having mobility and balance deficits  herself making caregiver duties more difficult    Co-evaluation               AM-PAC PT "6 Clicks" Mobility  Outcome Measure Help needed turning from your back to your side while in a flat bed without using bedrails?: A Little Help needed moving from lying on your back to sitting on the side of a flat bed without using bedrails?: A Little Help needed moving to and from a bed to a chair (including a wheelchair)?: A Little Help needed standing up from a chair using your arms (e.g., wheelchair or bedside chair)?: A Little Help needed to walk in hospital room?: A Little Help needed climbing 3-5 steps with a railing? : A Little 6 Click Score: 18    End of Session Equipment Utilized During Treatment: Gait belt Activity Tolerance: Patient tolerated treatment well Patient left: in bed;with call bell/phone within reach;with bed alarm set;with family/visitor present;with nursing/sitter in room Nurse Communication: Mobility status PT Visit Diagnosis: Unsteadiness on feet (R26.81);Muscle weakness (generalized) (M62.81);Difficulty in walking, not elsewhere classified (R26.2)    Time: 9562-1308 PT Time Calculation (min) (ACUTE ONLY): 34 min   Charges:   PT Evaluation $PT Eval Low Complexity: 1 Low        Neveah Bang PT, DPT 1:14 PM,08/23/19 [phone]

## 2019-08-24 ENCOUNTER — Inpatient Hospital Stay: Payer: Medicare Other

## 2019-08-24 ENCOUNTER — Encounter: Payer: Self-pay | Admitting: Vascular Surgery

## 2019-08-24 DIAGNOSIS — I70222 Atherosclerosis of native arteries of extremities with rest pain, left leg: Secondary | ICD-10-CM

## 2019-08-24 LAB — CBC
HCT: 37.1 % — ABNORMAL LOW (ref 39.0–52.0)
Hemoglobin: 12.3 g/dL — ABNORMAL LOW (ref 13.0–17.0)
MCH: 27 pg (ref 26.0–34.0)
MCHC: 33.2 g/dL (ref 30.0–36.0)
MCV: 81.5 fL (ref 80.0–100.0)
Platelets: 215 10*3/uL (ref 150–400)
RBC: 4.55 MIL/uL (ref 4.22–5.81)
RDW: 14.2 % (ref 11.5–15.5)
WBC: 5.2 10*3/uL (ref 4.0–10.5)
nRBC: 0 % (ref 0.0–0.2)

## 2019-08-24 LAB — GLUCOSE, CAPILLARY
Glucose-Capillary: 110 mg/dL — ABNORMAL HIGH (ref 70–99)
Glucose-Capillary: 132 mg/dL — ABNORMAL HIGH (ref 70–99)
Glucose-Capillary: 195 mg/dL — ABNORMAL HIGH (ref 70–99)
Glucose-Capillary: 123 mg/dL — ABNORMAL HIGH (ref 70–99)

## 2019-08-24 LAB — BASIC METABOLIC PANEL
Anion gap: 9 (ref 5–15)
BUN: 22 mg/dL (ref 8–23)
CO2: 21 mmol/L — ABNORMAL LOW (ref 22–32)
Calcium: 8.7 mg/dL — ABNORMAL LOW (ref 8.9–10.3)
Chloride: 104 mmol/L (ref 98–111)
Creatinine, Ser: 0.99 mg/dL (ref 0.61–1.24)
GFR calc Af Amer: >60 mL/min (ref >60)
GFR calc non Af Amer: >60 mL/min (ref >60)
Glucose, Bld: 130 mg/dL — ABNORMAL HIGH (ref 70–99)
Potassium: 3.9 mmol/L (ref 3.5–5.1)
Sodium: 134 mmol/L — ABNORMAL LOW (ref 135–145)

## 2019-08-24 LAB — HEPARIN LEVEL (UNFRACTIONATED)
Heparin Unfractionated: 0.25 IU/mL — ABNORMAL LOW (ref 0.30–0.70)
Heparin Unfractionated: 0.32 IU/mL (ref 0.30–0.70)

## 2019-08-24 MED ORDER — IOHEXOL 350 MG/ML SOLN
100.0000 mL | Freq: Once | INTRAVENOUS | Status: AC | PRN
  Administered 2019-08-24: 10:00:00 100 mL via INTRAVENOUS

## 2019-08-24 MED ORDER — SODIUM CHLORIDE 0.9 % IV SOLN
INTRAVENOUS | Status: DC | PRN
  Administered 2019-08-24: 14:00:00 1000 mL via INTRAVENOUS

## 2019-08-24 MED ORDER — SODIUM CHLORIDE 0.9 % IV SOLN
INTRAVENOUS | Status: DC
  Administered 2019-08-24 – 2019-08-26 (×2): via INTRAVENOUS

## 2019-08-24 MED ORDER — HEPARIN BOLUS VIA INFUSION
1000.0000 [IU] | Freq: Once | INTRAVENOUS | Status: AC
  Administered 2019-08-24: 04:00:00 1000 [IU] via INTRAVENOUS
  Filled 2019-08-24: qty 1000

## 2019-08-24 NOTE — Progress Notes (Signed)
Patient ID: Howard [name], male   DOB: September 01, 1931, 85 y.o.   MRN: [id number]  [facility] PROGRESS NOTE  Howard [name] ZHG:[id number] DOB: Dec 05, 1931 DOA: 08/22/2019 PCP: Margo Common, PA  HPI/Subjective: Patient this morning was feeling okay.  Toe was feeling better.  He offers no complaints.  Little later he did cough up a little blood.  The heparin drip was increased a little bit earlier I spoke with the pharmacist to decrease it down to the lower dose.  Continue to monitor.  Objective: Vitals:   08/24/19 1400 08/24/19 1551  BP: (!) 86/49 (!) 98/57  Pulse: (!) 108 85  Resp: 20 (!) 22  Temp: 97.9 F (36.6 C) (!) 97.3 F (36.3 C)  SpO2: 94% 93%    Intake/Output Summary (Last 24 hours) at 08/24/2019 1707 Last data filed at 08/24/2019 1607 Gross per 24 hour  Intake 582.26 ml  Output 1520 ml  Net -937.74 ml   Filed Weights   08/22/19 2041 08/23/19 1557  Weight: 82.5 kg 82.5 kg    ROS: Review of Systems  Constitutional: Negative for chills and fever.  Eyes: Negative for blurred vision.  Respiratory: Positive for cough and hemoptysis. Negative for shortness of breath and wheezing.   Cardiovascular: Negative for chest pain.  Gastrointestinal: Negative for abdominal pain, constipation, diarrhea, nausea and vomiting.  Genitourinary: Negative for dysuria.  Musculoskeletal: Positive for joint pain.  Neurological: Negative for dizziness and headaches.   Exam: Physical Exam  Constitutional: He is oriented to person, place, and time.  HENT:  Nose: No mucosal edema.  Mouth/Throat: No oropharyngeal exudate or posterior oropharyngeal edema.  Eyes: Pupils are equal, round, and reactive to light. Conjunctivae, EOM and lids are normal.  Neck: No JVD present. Carotid bruit is not present. No edema present. No thyroid mass and no thyromegaly present.  Cardiovascular: S1 normal and S2 normal. Exam reveals no gallop.  No murmur heard. Pulses:      Dorsalis pedis pulses are 2+ on  the right side and 2+ on the left side.  Respiratory: No respiratory distress. He has no wheezes. He has no rhonchi. He has no rales.  GI: Soft. Bowel sounds are normal. There is no abdominal tenderness.  Musculoskeletal:     Right shoulder: He exhibits no swelling.  Lymphadenopathy:    He has no cervical adenopathy.  Neurological: He is alert and oriented to person, place, and time. No cranial nerve deficit.  Skin: Skin is warm. Nails show no clubbing.  Psychiatric: He has a normal mood and affect.     Data Reviewed: Basic Metabolic Panel: Recent Labs  Lab 08/21/19 1310 08/22/19 1405 08/23/19 0555 08/24/19 0301  NA 134* 134* 137 134*  K 4.6 4.3 3.9 3.9  CL 101 100 104 104  CO2 23 24 24  21*  GLUCOSE 125* 148* 132* 130*  BUN 21 23 20 22   CREATININE 0.98 1.06 0.92 0.99  CALCIUM 9.6 9.5 9.0 8.7*   Liver Function Tests: Recent Labs  Lab 08/21/19 1310  AST 21  ALT 18  ALKPHOS 48  BILITOT 0.4  PROT 7.6  ALBUMIN 4.4   CBC: Recent Labs  Lab 08/21/19 1310 08/22/19 1405 08/23/19 0555 08/24/19 0301  WBC 6.3 6.2 4.9 5.2  NEUTROABS 2.9 2.8  --   --   HGB 13.7 13.6 13.2 12.3*  HCT 42.0 41.0 39.9 37.1*  MCV 82.4 81.7 81.1 81.5  PLT 265 272 228 215    CBG: Recent Labs  Lab 08/23/19  1656 08/23/19 2128 08/24/19 0751 08/24/19 1149 08/24/19 1637  GLUCAP 106* 171* 110* 132* 195*    Recent Results (from the past 240 hour(s))  SARS Coronavirus 2 [facility] order, Performed in [facility] hospital lab) Nasopharyngeal Nasopharyngeal Swab     Status: None   Collection Time: 08/22/19  6:35 PM   Specimen: Nasopharyngeal Swab  Result Value Ref Range Status   SARS Coronavirus 2 NEGATIVE NEGATIVE Final    Comment: (NOTE) If result is NEGATIVE SARS-CoV-2 target nucleic acids are NOT DETECTED. The SARS-CoV-2 RNA is generally detectable in upper and lower  respiratory specimens during the acute phase of infection. The lowest  concentration of SARS-CoV-2 viral copies this  assay can detect is 250  copies / mL. A negative result does not preclude SARS-CoV-2 infection  and should not be used as the sole basis for treatment or other  patient management decisions.  A negative result may occur with  improper specimen collection / handling, submission of specimen other  than nasopharyngeal swab, presence of viral mutation(s) within the  areas targeted by this assay, and inadequate number of viral copies  (<250 copies / mL). A negative result must be combined with clinical  observations, patient history, and epidemiological information. If result is POSITIVE SARS-CoV-2 target nucleic acids are DETECTED. The SARS-CoV-2 RNA is generally detectable in upper and lower  respiratory specimens dur ing the acute phase of infection.  Positive  results are indicative of active infection with SARS-CoV-2.  Clinical  correlation with patient history and other diagnostic information is  necessary to determine patient infection status.  Positive results do  not rule out bacterial infection or co-infection with other viruses. If result is PRESUMPTIVE POSTIVE SARS-CoV-2 nucleic acids MAY BE PRESENT.   A presumptive positive result was obtained on the submitted specimen  and confirmed on repeat testing.  While 2019 novel coronavirus  (SARS-CoV-2) nucleic acids may be present in the submitted sample  additional confirmatory testing may be necessary for epidemiological  and / or clinical management purposes  to differentiate between  SARS-CoV-2 and other Sarbecovirus currently known to infect humans.  If clinically indicated additional testing with an alternate test  methodology [phone]) is advised. The SARS-CoV-2 RNA is generally  detectable in upper and lower respiratory sp ecimens during the acute  phase of infection. The expected result is Negative. Fact Sheet for Patients:  StrictlyIdeas.no Fact Sheet for Healthcare  Providers: BankingDealers.co.za This test is not yet approved or cleared by the [place] FDA and has been authorized for detection and/or diagnosis of SARS-CoV-2 by FDA under an Emergency Use Authorization (EUA).  This EUA will remain in effect (meaning this test can be used) for the duration of the COVID-19 declaration under Section 564(b)(1) of the Act, 21 U.S.C. section 360bbb-3(b)(1), unless the authorization is terminated or revoked sooner. Performed at [facility], [address] [place], [place] [address]   Culture, blood (routine x 2)     Status: None (Preliminary result)   Collection Time: 08/22/19  7:52 PM   Specimen: BLOOD  Result Value Ref Range Status   Specimen Description BLOOD RIGHT FOREARM  Final   Special Requests   Final    BOTTLES DRAWN AEROBIC AND ANAEROBIC Blood Culture adequate volume   Culture   Final    NO GROWTH 2 DAYS Performed at [facility], [address] [place], [place] [address]    Report Status PENDING  Incomplete  Culture, blood (routine x 2)  Status: None (Preliminary result)   Collection Time: 08/22/19  7:52 PM   Specimen: BLOOD  Result Value Ref Range Status   Specimen Description BLOOD LEFT ANTECUBITAL  Final   Special Requests   Final    BOTTLES DRAWN AEROBIC AND ANAEROBIC Blood Culture results may not be optimal due to an excessive volume of blood received in culture bottles   Culture   Final    NO GROWTH 2 DAYS Performed at [facility], [address] [place], [place] [address]    Report Status PENDING  Incomplete      Scheduled Meds: . aspirin EC  81 mg Oral Daily  . fentaNYL  1 patch Transdermal Q72H  . fluticasone  2 spray Each Nare Daily  . insulin aspart  0-15 Units Subcutaneous TID WC  . insulin aspart  0-5 Units Subcutaneous QHS  . isosorbide mononitrate  30 mg Oral Daily  . pantoprazole  40 mg Oral Daily  . simvastatin  40 mg Oral QHS  . sodium chloride  flush  3 mL Intravenous Q12H  . spironolactone  50 mg Oral q morning - 10a   Continuous Infusions: . [START ON 08/25/2019] sodium chloride    . sodium chloride Stopped (08/24/19 1547)  . cefTRIAXone (ROCEPHIN)  IV Stopped (08/24/19 1447)  . heparin 1,000 Units/hr (08/24/19 1000)    Assessment/Plan:  1. Ischemic toe versus cellulitis.  Patient on heparin drip.  Continue antibiotics.  Left first toe is looking less red. 2. Relative hypotension on IV fluids.  Hold Entresto and metoprolol. 3. Peripheral vascular disease.  Patient on the schedule for bilateral femoral endarterectomy and aortic stent graft placement. 4. Type 2 diabetes mellitus.  Hold Glucophage with angiogram.  Sliding scale coverage. 5. Chronic systolic congestive heart failure.  No signs of heart failure currently.  Decrease rate of IV fluids.  Hold Entresto and metoprolol With relative hypotension. 6. Hemoptysis likely secondary to heparin drip.  Chest x-ray negative for pneumonia.  The CT scan caught the bottom of the lungs which showed the patient does have pulmonary nodules.   Code Status:     Code Status Orders  (From admission, onward)         Start     Ordered   08/22/19 1826  Full code  Continuous     08/22/19 1825        Code Status History    Date Active Date Inactive Code Status Order ID Comments User Context   07/09/2019 1057 07/09/2019 1725 Full Code [id number]  Deboraha Sprang, MD Inpatient   08/26/2014 1001 08/26/2014 1543 Full Code [id number]  [place], Peter M, MD Inpatient   06/07/2014 1324 06/07/2014 2149 Full Code [id number]  [name], MD Inpatient   05/30/2014 0838 05/31/2014 0337 Full Code [id number]  Marybelle Killings, MD [facility]   Advance Care Planning Activity    Advance Directive Documentation     Most Recent Value  Type of Advance Directive  Healthcare Power of Attorney, Living will  Pre-existing out of facility DNR order (yellow form or pink MOST form)  -  "MOST" Form in Place?  -     Family  Communication: Spoke with patient's wife at the bedside Disposition Plan: To be determined  Consultants:  Vascular surgery  Procedures:  Angiogram lower extremities  Antibiotics:  Antibiotics changed to Rocephin  Time spent: 28 minutes  [place]

## 2019-08-24 NOTE — Progress Notes (Signed)
ANTICOAGULATION CONSULT NOTE - Initial Consult  Pharmacy Consult for Heparin  Indication: peripheral vascular disease  Allergies  Allergen Reactions  . Sulfa Antibiotics Rash    Patient Measurements: Height: 5' 9.5" (176.5 cm) Weight: 181 lb 14.1 oz (82.5 kg) IBW/kg (Calculated) : 71.85 Heparin Dosing Weight: 82.5 kg   Vital Signs: Temp: 98.3 F (36.8 C) (09/01 1945) Temp Source: Oral (09/01 1945) BP: 107/57 (09/01 1945) Pulse Rate: 81 (09/01 1945)  Labs: Recent Labs    08/22/19 1405 08/22/19 2204  08/23/19 0555 08/23/19 1406 08/24/19 0301 08/24/19 1915  HGB 13.6  --   --  13.2  --  12.3*  --   HCT 41.0  --   --  39.9  --  37.1*  --   PLT 272  --   --  228  --  215  --   APTT  --  48*  --   --   --   --   --   LABPROT  --  14.3  --  13.7  --   --   --   INR  --  1.1  --  1.1  --   --   --   HEPARINUNFRC  --   --    < > 0.34 0.18* 0.25* 0.32  CREATININE 1.06  --   --  0.92  --  0.99  --    < > = values in this interval not displayed.    Estimated Creatinine Clearance: 54.5 mL/min (by C-G formula based on SCr of 0.99 mg/dL).   Medical History: Past Medical History:  Diagnosis Date  . Arthritis   . Bell palsy   . Cancer [facility])    prostate and skin  . Chronic combined systolic and diastolic CHF, NYHA class 1 ([place])    a. 07/2014 Echo: EF 35-40%, Gr 1 DD.  Mar[place] Kitchen Complete heart block ([place])    a. 11/2010 s/p SJM 2210 [facility] DC PPM, ser# [id number].  . Depression   . Diabetes mellitus without complication ([place])   . Fall 11-10-14  . GERD (gastroesophageal reflux disease)   . History of prostate cancer   . Hyperlipidemia   . Hypertension   . LBBB (left bundle branch block)   . Left-sided Bell's palsy   . Lung cancer ([place]) 2016  . NICM (nonischemic cardiomyopathy) ([place])    a. 07/2014 Echo: EF 35-40%, mid-apicalanteroseptal DK, Gr 1 DD, mild-mod dil LA.  . Non-obstructive CAD    a. 07/2014 Abnl MV;  b. 08/2014 Cath: LM nl, LAD 30p, RI 40p, LCX nl, OM1 40, RCA dominant  30p, 70d-->Med Rx.  Mar[place] Kitchen Poor balance   . Presence of permanent cardiac pacemaker   . Sleep apnea    a. cpap  . Vertigo   . WPW (Wolff-Parkinson-White syndrome)    a. S/P RFCA 1991.    Medications:  Medications Prior to Admission  Medication Sig Dispense Refill Last Dose  . aspirin EC 81 MG tablet Take 81 mg by mouth daily.   08/22/2019 at 0800  . chlorthalidone (HYGROTON) 25 MG tablet Take 1 tablet (25 mg total) by mouth daily. 90 tablet 3 08/22/2019 at 0800  . Cholecalciferol (VITAMIN D3) 1000 UNITS CAPS Take 1,000 Units by mouth daily.    08/22/2019 at 0800  . doxycycline (VIBRAMYCIN) 100 MG capsule Take 1 capsule (100 mg total) by mouth 2 (two) times daily for 7 days. 14 capsule 0 08/22/2019 at 0800  . ENTRESTO 97-103 MG TAKE 1 TABLET  TWICE A DAY (Patient taking differently: Take 1 tablet by mouth 2 (two) times a day. ) 180 tablet 3 08/22/2019 at 0800  . fentaNYL (DURAGESIC) 12 MCG/HR Place 1 patch onto the skin every 3 (three) days. 10 patch 0   . fluticasone (FLONASE) 50 MCG/ACT nasal spray Place 2 sprays into both nostrils daily.   08/22/2019 at 0800  . isosorbide mononitrate (IMDUR) 30 MG 24 hr tablet TAKE 1 TABLET BY MOUTH DAILY (Patient taking differently: Take 30 mg by mouth daily. ) 90 tablet 1 08/21/2019 at 2100  . metFORMIN (GLUCOPHAGE) 500 MG tablet Take 1 tablet (500 mg total) by mouth 2 (two) times daily with a meal. 180 tablet 3 08/22/2019 at 0800  . metoprolol succinate (TOPROL-XL) 25 MG 24 hr tablet Take 1 tablet (25 mg) by mouth once daily at bedtime (Patient taking differently: Take 25 mg by mouth at bedtime. ) 90 tablet 3 08/21/2019 at 2100  . montelukast (SINGULAIR) 10 MG tablet Take 10 mg by mouth daily as needed (sneezing/allergies.).    Unknown at PRN  . Multiple Vitamin (MULTIVITAMIN WITH MINERALS) TABS tablet Take 1 tablet by mouth daily. One-A-Day Multivitamin   08/22/2019 at 0800  . mupirocin ointment (BACTROBAN) 2 % Apply to affected area 3 times daily 22 g 0   .  omeprazole (PRILOSEC) 40 MG capsule Take 1 capsule (40 mg total) by mouth 2 (two) times daily as needed. (Patient taking differently: Take 40 mg by mouth 2 (two) times a day. ) 180 capsule 3 08/22/2019 at 0800  . simvastatin (ZOCOR) 40 MG tablet TAKE 1 TABLET AT BEDTIME (Patient taking differently: Take 40 mg by mouth at bedtime. ) 90 tablet 0 08/21/2019 at 2100  . spironolactone (ALDACTONE) 50 MG tablet Take 1 tablet (50 mg) by mouth once daily in the morning (Patient taking differently: Take 50 mg by mouth every morning. ) 90 tablet 3 08/22/2019 at 0800    Assessment: Pharmacy has been consulted for Heparin Drip initiation on 85yo patient admitted with cellulitis of left foot/toes. Patient scheduled for angiography of the left lower limb in the AM. Does not take any anticoagulant therapy PTA. Will initiate therapy immediately. Baseline labs have been ordered and are pending.  Heparin Course 08/31 @ 0500 HL 0.34 therapeutic. Will continue current rate and will recheck HL @ 1400 08/31 @ 1406 HL 0.18 subtherapeutic. 09/01 @ 0301 HL 0.25 subtherapeutic: rate increased to 1150 units/hr 09/01 1010 patient coughing up blood: attending requested rate be lowered back to 1000 units/hr  Goal of Therapy:  Heparin level 0.2-0.7 units/ml Monitor platelets by anticoagulation protocol: Yes   Plan:   After discussing the hemoptysis with the vascular surgery team the goal heparin level was lowered to 0.2-0.7 U/mL  Continue heparin infusion at 1000 units/hr  Heparin will be d/c at 0600 9/2 before surgery  Recheck heparin level in 8 hours after the previous rate change  CBC in am  09/01:  HL @ 1915 = 0.32 Will continue pt on current rate and recheck HL in 8 hrs on 9/2 @ 0300.  Shaine Newmark D 08/24/2019,8:52 PM

## 2019-08-24 NOTE — Progress Notes (Signed)
[facility] Daily Progress Note   Subjective: 1 Day Post-Op: 1. Ultrasound guidance for vascular access right femoral artery 2. Catheter placement into aorta from right femoral approach 3. Aortogram and selective left lower extremity angiogram 4. Percutaneous transluminal angioplasty of right external iliac artery with 7 mm diameter by 6 cm length Lutonix drug-coated angioplasty balloon 5.StarClose closure device right femoral artery  Patient without complaint this afternoon.  No issues overnight.  Underwent CT this AM.  Objective: Vitals:   08/23/19 2009 08/24/19 0446 08/24/19 0800 08/24/19 1148  BP: (!) 90/45 (!) 110/59 136/63 98/75  Pulse: 79 87 85 (!) 110  Resp: 20 20 20 18   Temp: 98.4 F (36.9 C) 98.5 F (36.9 C) (!) 97.4 F (36.3 C) 98.5 F (36.9 C)  TempSrc: Oral Oral Oral Oral  SpO2: 91% 95% 94% 97%  Weight:      Height:        Intake/Output Summary (Last 24 hours) at 08/24/2019 1330 Last data filed at 08/24/2019 1200 Gross per 24 hour  Intake 363.25 ml  Output 1520 ml  Net -1156.75 ml   Physical Exam: A&Ox3, NAD CV: RRR Pulmonary: CTA Bilaterally Abdomen: Soft, Nontender, Nondistended Right groin: Access site clean dry and intact. Vascular:  Left lower extremity: Soft.  Calf soft.  Warm distally to toes.  First toe with continued erythema.   Laboratory: CBC    Component Value Date/Time   WBC 5.2 08/24/2019 0301   HGB 12.3 (L) 08/24/2019 0301   HGB 14.0 07/01/2019 0924   HCT 37.1 (L) 08/24/2019 0301   HCT 43.7 07/01/2019 0924   PLT 215 08/24/2019 0301   PLT 297 07/01/2019 0924   BMET    Component Value Date/Time   NA 134 (L) 08/24/2019 0301   NA 134 07/01/2019 0930   NA 137 11/10/2014 2041   K 3.9 08/24/2019 0301   K 3.9 11/10/2014 2041   CL 104 08/24/2019 0301   CL 104 11/10/2014 2041   CO2 21 (L) 08/24/2019 0301   CO2 24 11/10/2014 2041   GLUCOSE 130 (H)  08/24/2019 0301   GLUCOSE 154 (H) 11/10/2014 2041   BUN 22 08/24/2019 0301   BUN 28 (H) 07/01/2019 0930   BUN 13 11/10/2014 2041   CREATININE 0.99 08/24/2019 0301   CREATININE 0.81 09/30/2017 1003   CALCIUM 8.7 (L) 08/24/2019 0301   CALCIUM 8.5 11/10/2014 2041   GFRNONAA >60 08/24/2019 0301   GFRNONAA 82 09/30/2017 1003   GFRAA >60 08/24/2019 0301   GFRAA 95 09/30/2017 1003   Assessment/Planning: The patient is an 85 year old male with multiple medical issues including atherosclerotic disease who presented to the [facility] emergency department with worsening left lower extremity pain. 1) patient is on schedule tomorrow for bilateral femoral endarterectomy, aortic stent graft placement, possible left renal artery stent, possible left superficial femoral artery stent placement.  Will preop. 2) Heparin drip to stop at 6 AM tomorrow morning in preparation for surgery. 3) Dr. Lucky Cowboy and I had a long discussion with the patient and his wife who is at the bedside about his procedure tomorrow as well as the risks and benefits.  All questions were answered.  The patient and his wife wish to proceed.  Discussed with Dr. Ellis Parents Lamarcus Spira PA-C 08/24/2019 1:30 PM

## 2019-08-24 NOTE — Consult Note (Signed)
[place] for Heparin Drip Indication: Peripheral Vascular Disease  Allergies  Allergen Reactions  . Sulfa Antibiotics Rash   Patient Measurements: Height: 5' 9.5" (176.5 cm) Weight: 181 lb 14.1 oz (82.5 kg) IBW/kg (Calculated) : 71.85 Heparin Dosing Weight: 82.5 kg  Vital Signs: Temp: 98.4 F (36.9 C) (08/31 2009) Temp Source: Oral (08/31 2009) BP: 90/45 (08/31 2009) Pulse Rate: 79 (08/31 2009)  Labs: Recent Labs    08/22/19 1405 08/22/19 2204 08/23/19 0555 08/23/19 1406 08/24/19 0301  HGB 13.6  --  13.2  --  12.3*  HCT 41.0  --  39.9  --  37.1*  PLT 272  --  228  --  215  APTT  --  48*  --   --   --   LABPROT  --  14.3 13.7  --   --   INR  --  1.1 1.1  --   --   HEPARINUNFRC  --   --  0.34 0.18* 0.25*  CREATININE 1.06  --  0.92  --  0.99   Estimated Creatinine Clearance: 54.5 mL/min (by C-G formula based on SCr of 0.99 mg/dL).  Medical History: Past Medical History:  Diagnosis Date  . Arthritis   . Bell palsy   . Cancer [facility])    prostate and skin  . Chronic combined systolic and diastolic CHF, NYHA class 1 ([place])    a. 07/2014 Echo: EF 35-40%, Gr 1 DD.  Marland Kitchen Complete heart block ([place])    a. 11/2010 s/p SJM 2210 Accent DC PPM, ser# [id number].  . Depression   . Diabetes mellitus without complication ([place])   . Fall 11-10-14  . GERD (gastroesophageal reflux disease)   . History of prostate cancer   . Hyperlipidemia   . Hypertension   . LBBB (left bundle branch block)   . Left-sided Bell's palsy   . Lung cancer ([place]) 2016  . NICM (nonischemic cardiomyopathy) ([place])    a. 07/2014 Echo: EF 35-40%, mid-apicalanteroseptal DK, Gr 1 DD, mild-mod dil LA.  . Non-obstructive CAD    a. 07/2014 Abnl MV;  b. 08/2014 Cath: LM nl, LAD 30p, RI 40p, LCX nl, OM1 40, RCA dominant 30p, 70d-->Med Rx.  Marland Kitchen Poor balance   . Presence of permanent cardiac pacemaker   . Sleep apnea    a. cpap  . Vertigo   . WPW (Wolff-Parkinson-White syndrome)    a.  S/P RFCA 1991.    Medications:  Medications Prior to Admission  Medication Sig Dispense Refill Last Dose  . aspirin EC 81 MG tablet Take 81 mg by mouth daily.   08/22/2019 at 0800  . chlorthalidone (HYGROTON) 25 MG tablet Take 1 tablet (25 mg total) by mouth daily. 90 tablet 3 08/22/2019 at 0800  . Cholecalciferol (VITAMIN D3) 1000 UNITS CAPS Take 1,000 Units by mouth daily.    08/22/2019 at 0800  . doxycycline (VIBRAMYCIN) 100 MG capsule Take 1 capsule (100 mg total) by mouth 2 (two) times daily for 7 days. 14 capsule 0 08/22/2019 at 0800  . ENTRESTO 97-103 MG TAKE 1 TABLET TWICE A DAY (Patient taking differently: Take 1 tablet by mouth 2 (two) times a day. ) 180 tablet 3 08/22/2019 at 0800  . fentaNYL (DURAGESIC) 12 MCG/HR Place 1 patch onto the skin every 3 (three) days. 10 patch 0   . fluticasone (FLONASE) 50 MCG/ACT nasal spray Place 2 sprays into both nostrils daily.   08/22/2019 at 0800  . isosorbide mononitrate (IMDUR)  30 MG 24 hr tablet TAKE 1 TABLET BY MOUTH DAILY (Patient taking differently: Take 30 mg by mouth daily. ) 90 tablet 1 08/21/2019 at 2100  . metFORMIN (GLUCOPHAGE) 500 MG tablet Take 1 tablet (500 mg total) by mouth 2 (two) times daily with a meal. 180 tablet 3 08/22/2019 at 0800  . metoprolol succinate (TOPROL-XL) 25 MG 24 hr tablet Take 1 tablet (25 mg) by mouth once daily at bedtime (Patient taking differently: Take 25 mg by mouth at bedtime. ) 90 tablet 3 08/21/2019 at 2100  . montelukast (SINGULAIR) 10 MG tablet Take 10 mg by mouth daily as needed (sneezing/allergies.).    Unknown at PRN  . Multiple Vitamin (MULTIVITAMIN WITH MINERALS) TABS tablet Take 1 tablet by mouth daily. One-A-Day Multivitamin   08/22/2019 at 0800  . mupirocin ointment (BACTROBAN) 2 % Apply to affected area 3 times daily 22 g 0   . omeprazole (PRILOSEC) 40 MG capsule Take 1 capsule (40 mg total) by mouth 2 (two) times daily as needed. (Patient taking differently: Take 40 mg by mouth 2 (two) times a day. )  180 capsule 3 08/22/2019 at 0800  . simvastatin (ZOCOR) 40 MG tablet TAKE 1 TABLET AT BEDTIME (Patient taking differently: Take 40 mg by mouth at bedtime. ) 90 tablet 0 08/21/2019 at 2100  . spironolactone (ALDACTONE) 50 MG tablet Take 1 tablet (50 mg) by mouth once daily in the morning (Patient taking differently: Take 50 mg by mouth every morning. ) 90 tablet 3 08/22/2019 at 0800   Scheduled:  . aspirin EC  81 mg Oral Daily  . fentaNYL  1 patch Transdermal Q72H  . fentaNYL      . fluticasone  2 spray Each Nare Daily  . heparin      . insulin aspart  0-15 Units Subcutaneous TID WC  . insulin aspart  0-5 Units Subcutaneous QHS  . isosorbide mononitrate  30 mg Oral Daily  . metoprolol succinate  25 mg Oral QHS  . midazolam      . pantoprazole  40 mg Oral Daily  . sacubitril-valsartan  1 tablet Oral BID  . simvastatin  40 mg Oral QHS  . sodium chloride flush  3 mL Intravenous Q12H  . spironolactone  50 mg Oral q morning - 10a   Infusions:  . sodium chloride Stopped (08/23/19 1433)  . cefTRIAXone (ROCEPHIN)  IV Stopped (08/23/19 1439)  . heparin 1,000 Units/hr (08/23/19 1903)   PRN: sodium chloride, acetaminophen **OR** acetaminophen, HYDROmorphone (DILAUDID) injection, montelukast, ondansetron **OR** ondansetron (ZOFRAN) IV, ondansetron (ZOFRAN) IV, sodium chloride flush, traZODone Anti-infectives (From admission, onward)   Start     Dose/Rate Route Frequency Ordered Stop   08/24/19 1100  vancomycin (VANCOCIN) 2,000 mg in sodium chloride 0.9 % 500 mL IVPB  Status:  Discontinued     2,000 mg 250 mL/hr over 120 Minutes Intravenous Every 36 hours 08/22/19 2147 08/23/19 1159   08/23/19 1745  ceFAZolin (ANCEF) IVPB 2g/100 mL premix  Status:  Discontinued     2 g 200 mL/hr over 30 Minutes Intravenous  Once 08/23/19 1740 08/23/19 1756   08/23/19 1400  cefTRIAXone (ROCEPHIN) 2 g in sodium chloride 0.9 % 100 mL IVPB     2 g 200 mL/hr over 30 Minutes Intravenous Every 24 hours 08/23/19 1159      08/23/19 0600  meropenem (MERREM) 1 g in sodium chloride 0.9 % 100 mL IVPB  Status:  Discontinued     1 g 200 mL/hr over  30 Minutes Intravenous Every 8 hours 08/22/19 2147 08/23/19 1159   08/22/19 1830  vancomycin (VANCOCIN) 2,000 mg in sodium chloride 0.9 % 500 mL IVPB     2,000 mg 250 mL/hr over 120 Minutes Intravenous  Once 08/22/19 1825 08/23/19 0808   08/22/19 1830  meropenem (MERREM) 1 g in sodium chloride 0.9 % 100 mL IVPB     1 g 200 mL/hr over 30 Minutes Intravenous  Once 08/22/19 1825 08/22/19 2226   08/22/19 1815  clindamycin (CLEOCIN) IVPB 600 mg     600 mg 100 mL/hr over 30 Minutes Intravenous  Once 08/22/19 1809 08/22/19 1901      Assessment: Pharmacy has been consulted for Heparin Drip initiation on 85yo patient admitted with cellulitis of left foot/toes. Patient scheduled for angiography of the left lower limb in the AM. Does not take any anticoagulant therapy PTA. Will initiate therapy immediately. Baseline labs have been ordered and are pending.  08/31 @ 0500 HL 0.34 therapeutic. Will continue current rate and will recheck HL @ 1400 08/31 @ 1406 HL 0.18 subtherapeutic. 09/01 @ 0301 HL 0.25 subtherapeutic  Goal of Therapy:  Heparin level 0.3-0.7 units/ml Monitor platelets by anticoagulation protocol: Yes   Plan: HDW = 82.5Kg Patient went for angiogram today.  Heparin restarted at 1903 at 1000 units/hr.   Rebolus w/ 1000 units x 1 then increase infusion to 1150 units/hr Recheck heparin level in 8 hours CBC WNL will continue to monitor.  Hart Robinsons PharmD Clinical Pharmacist 08/24/2019

## 2019-08-24 NOTE — Consult Note (Signed)
[place] for Heparin Drip Indication: Peripheral Vascular Disease  Allergies  Allergen Reactions  . Sulfa Antibiotics Rash   Patient Measurements: Height: 5' 9.5" (176.5 cm) Weight: 181 lb 14.1 oz (82.5 kg) IBW/kg (Calculated) : 71.85 Heparin Dosing Weight: 82.5 kg  Vital Signs: Temp: 98.5 F (36.9 C) (09/01 1148) Temp Source: Oral (09/01 1148) BP: 98/75 (09/01 1148) Pulse Rate: 110 (09/01 1148)  Labs: Recent Labs    08/22/19 1405 08/22/19 2204 08/23/19 0555 08/23/19 1406 08/24/19 0301  HGB 13.6  --  13.2  --  12.3*  HCT 41.0  --  39.9  --  37.1*  PLT 272  --  228  --  215  APTT  --  48*  --   --   --   LABPROT  --  14.3 13.7  --   --   INR  --  1.1 1.1  --   --   HEPARINUNFRC  --   --  0.34 0.18* 0.25*  CREATININE 1.06  --  0.92  --  0.99   Estimated Creatinine Clearance: 54.5 mL/min (by C-G formula based on SCr of 0.99 mg/dL).  Medical History: Past Medical History:  Diagnosis Date  . Arthritis   . Bell palsy   . Cancer [facility])    prostate and skin  . Chronic combined systolic and diastolic CHF, NYHA class 1 ([place])    a. 07/2014 Echo: EF 35-40%, Gr 1 DD.  Marland Kitchen Complete heart block ([place])    a. 11/2010 s/p SJM 2210 Accent DC PPM, ser# [id number].  . Depression   . Diabetes mellitus without complication ([place])   . Fall 11-10-14  . GERD (gastroesophageal reflux disease)   . History of prostate cancer   . Hyperlipidemia   . Hypertension   . LBBB (left bundle branch block)   . Left-sided Bell's palsy   . Lung cancer ([place]) 2016  . NICM (nonischemic cardiomyopathy) ([place])    a. 07/2014 Echo: EF 35-40%, mid-apicalanteroseptal DK, Gr 1 DD, mild-mod dil LA.  . Non-obstructive CAD    a. 07/2014 Abnl MV;  b. 08/2014 Cath: LM nl, LAD 30p, RI 40p, LCX nl, OM1 40, RCA dominant 30p, 70d-->Med Rx.  Marland Kitchen Poor balance   . Presence of permanent cardiac pacemaker   . Sleep apnea    a. cpap  . Vertigo   . WPW (Wolff-Parkinson-White syndrome)     a. S/P RFCA 1991.    Medications:  Medications Prior to Admission  Medication Sig Dispense Refill Last Dose  . aspirin EC 81 MG tablet Take 81 mg by mouth daily.   08/22/2019 at 0800  . chlorthalidone (HYGROTON) 25 MG tablet Take 1 tablet (25 mg total) by mouth daily. 90 tablet 3 08/22/2019 at 0800  . Cholecalciferol (VITAMIN D3) 1000 UNITS CAPS Take 1,000 Units by mouth daily.    08/22/2019 at 0800  . doxycycline (VIBRAMYCIN) 100 MG capsule Take 1 capsule (100 mg total) by mouth 2 (two) times daily for 7 days. 14 capsule 0 08/22/2019 at 0800  . ENTRESTO 97-103 MG TAKE 1 TABLET TWICE A DAY (Patient taking differently: Take 1 tablet by mouth 2 (two) times a day. ) 180 tablet 3 08/22/2019 at 0800  . fentaNYL (DURAGESIC) 12 MCG/HR Place 1 patch onto the skin every 3 (three) days. 10 patch 0   . fluticasone (FLONASE) 50 MCG/ACT nasal spray Place 2 sprays into both nostrils daily.   08/22/2019 at 0800  . isosorbide mononitrate (IMDUR)  30 MG 24 hr tablet TAKE 1 TABLET BY MOUTH DAILY (Patient taking differently: Take 30 mg by mouth daily. ) 90 tablet 1 08/21/2019 at 2100  . metFORMIN (GLUCOPHAGE) 500 MG tablet Take 1 tablet (500 mg total) by mouth 2 (two) times daily with a meal. 180 tablet 3 08/22/2019 at 0800  . metoprolol succinate (TOPROL-XL) 25 MG 24 hr tablet Take 1 tablet (25 mg) by mouth once daily at bedtime (Patient taking differently: Take 25 mg by mouth at bedtime. ) 90 tablet 3 08/21/2019 at 2100  . montelukast (SINGULAIR) 10 MG tablet Take 10 mg by mouth daily as needed (sneezing/allergies.).    Unknown at PRN  . Multiple Vitamin (MULTIVITAMIN WITH MINERALS) TABS tablet Take 1 tablet by mouth daily. One-A-Day Multivitamin   08/22/2019 at 0800  . mupirocin ointment (BACTROBAN) 2 % Apply to affected area 3 times daily 22 g 0   . omeprazole (PRILOSEC) 40 MG capsule Take 1 capsule (40 mg total) by mouth 2 (two) times daily as needed. (Patient taking differently: Take 40 mg by mouth 2 (two) times a day. )  180 capsule 3 08/22/2019 at 0800  . simvastatin (ZOCOR) 40 MG tablet TAKE 1 TABLET AT BEDTIME (Patient taking differently: Take 40 mg by mouth at bedtime. ) 90 tablet 0 08/21/2019 at 2100  . spironolactone (ALDACTONE) 50 MG tablet Take 1 tablet (50 mg) by mouth once daily in the morning (Patient taking differently: Take 50 mg by mouth every morning. ) 90 tablet 3 08/22/2019 at 0800   Scheduled:  . aspirin EC  81 mg Oral Daily  . fentaNYL  1 patch Transdermal Q72H  . fluticasone  2 spray Each Nare Daily  . insulin aspart  0-15 Units Subcutaneous TID WC  . insulin aspart  0-5 Units Subcutaneous QHS  . isosorbide mononitrate  30 mg Oral Daily  . metoprolol succinate  25 mg Oral QHS  . pantoprazole  40 mg Oral Daily  . sacubitril-valsartan  1 tablet Oral BID  . simvastatin  40 mg Oral QHS  . sodium chloride flush  3 mL Intravenous Q12H  . spironolactone  50 mg Oral q morning - 10a   Infusions:  . [START ON 08/25/2019] sodium chloride    . sodium chloride 1,000 mL (08/24/19 1413)  . cefTRIAXone (ROCEPHIN)  IV 2 g (08/24/19 1413)  . heparin 1,000 Units/hr (08/24/19 1000)   PRN: sodium chloride, acetaminophen **OR** acetaminophen, HYDROmorphone (DILAUDID) injection, montelukast, ondansetron **OR** ondansetron (ZOFRAN) IV, ondansetron (ZOFRAN) IV, sodium chloride flush, traZODone Anti-infectives (From admission, onward)   Start     Dose/Rate Route Frequency Ordered Stop   08/24/19 1100  vancomycin (VANCOCIN) 2,000 mg in sodium chloride 0.9 % 500 mL IVPB  Status:  Discontinued     2,000 mg 250 mL/hr over 120 Minutes Intravenous Every 36 hours 08/22/19 2147 08/23/19 1159   08/23/19 1745  ceFAZolin (ANCEF) IVPB 2g/100 mL premix  Status:  Discontinued     2 g 200 mL/hr over 30 Minutes Intravenous  Once 08/23/19 1740 08/23/19 1756   08/23/19 1400  cefTRIAXone (ROCEPHIN) 2 g in sodium chloride 0.9 % 100 mL IVPB     2 g 200 mL/hr over 30 Minutes Intravenous Every 24 hours 08/23/19 1159     08/23/19  0600  meropenem (MERREM) 1 g in sodium chloride 0.9 % 100 mL IVPB  Status:  Discontinued     1 g 200 mL/hr over 30 Minutes Intravenous Every 8 hours 08/22/19 2147 08/23/19 1159  08/22/19 1830  vancomycin (VANCOCIN) 2,000 mg in sodium chloride 0.9 % 500 mL IVPB     2,000 mg 250 mL/hr over 120 Minutes Intravenous  Once 08/22/19 1825 08/23/19 0808   08/22/19 1830  meropenem (MERREM) 1 g in sodium chloride 0.9 % 100 mL IVPB     1 g 200 mL/hr over 30 Minutes Intravenous  Once 08/22/19 1825 08/22/19 2226   08/22/19 1815  clindamycin (CLEOCIN) IVPB 600 mg     600 mg 100 mL/hr over 30 Minutes Intravenous  Once 08/22/19 1809 08/22/19 1901      Assessment: Pharmacy has been consulted for Heparin Drip initiation on 85yo patient admitted with cellulitis of left foot/toes. Patient scheduled for angiography of the left lower limb in the AM. Does not take any anticoagulant therapy PTA. Will initiate therapy immediately. Baseline labs have been ordered and are pending.  Heparin Course 08/31 @ 0500 HL 0.34 therapeutic. Will continue current rate and will recheck HL @ 1400 08/31 @ 1406 HL 0.18 subtherapeutic. 09/01 @ 0301 HL 0.25 subtherapeutic: rate increased to 1150 units/hr 09/01 1010 patient coughing up blood: attending requested rate be lowered back to 1000 units/hr  Goal of Therapy:  Heparin level 0.2-0.7 units/ml Monitor platelets by anticoagulation protocol: Yes   Plan:   After discussing the hemoptysis with the vascular surgery team the goal heparin level was lowered to 0.2-0.7 U/mL  Continue heparin infusion at 1000 units/hr  Heparin will be d/c at 0600 9/2 before surgery  Recheck heparin level in 8 hours after the previous rate change  CBC in am  Vallery Sa, PharmD Clinical Pharmacist 08/24/2019

## 2019-08-24 NOTE — Progress Notes (Signed)
At noon, patient's blood pressure was 98/75 with a map of 84, heart rate 110, respiratory rate 18, temperature 98.26f.  Two hours later at 1415, blood pressure dropped to 86/49, map of 60, heart rate 108, respiratory rate of 20 and temperature of 97.63f. Primary concern was the drop in blood pressure. Dr. Leslye Peer was contacted and entresto was discontinued. Will continue to monitor vitals per MEWS protocol.  Marry Guan, RN. 08/24/2019. 3:04 PM

## 2019-08-24 NOTE — TOC Initial Note (Addendum)
Transition of Care [facility]) - Initial/Assessment Note    Patient Details  Name: Howard [name] MRN: [id number] Date of Birth: 06-Aug-1931  [facility]) CM/SW Contact:    Beverly Sessions, RN Phone Number: 08/24/2019, 2:04 PM  Clinical Narrative:                 Patient admitted from home with cellulitis  Patient lives at home with wife.  Wife is at bedside.   PCP Chrismon.  Patient states that at baseline he still drives.  Wife speaks up and says "it's his walking he has trouble with"  Patient obtains his daily [facility].  Any acute medications he gets from [facility]. Patient denies issues obtaining medications.   PT has assessed patient and recommends home health.  He is agreeable.  Patient and wife states they do not have preference for home health agency.  PT has also recommended RW.  Patient agreeable, heads up referral made to [facility] with [facility].  Referral made to Sarah with [facility] [facility] home health.  Referral accepted   Patient to be scheduled for OR tomorrow, PT will need to reevaluated once medically cleared.   Expected Discharge Plan: [place] Barriers to Discharge: Continued Medical Work up   Patient Goals and CMS Choice     Choice offered to / list presented to : Patient, Spouse  Expected Discharge Plan and Services Expected Discharge Plan: [place] Acute Care Choice: Durable Medical Equipment, Home Health Living arrangements for the past 2 months: Single Family Home                                      Prior Living Arrangements/Services Living arrangements for the past 2 months: Single Family Home Lives with:: Spouse Patient language and need for interpreter reviewed:: Yes Do you feel safe going back to the place where you live?: Yes      Need for Family Participation in Patient Care: Yes (Comment) Care giver support system in place?: Yes (comment)   Criminal  Activity/Legal Involvement Pertinent to Current Situation/Hospitalization: No - Comment as needed  Activities of Daily Living Home Assistive Devices/Equipment: Crutches ADL Screening (condition at time of admission) Patient's cognitive ability adequate to safely complete daily activities?: Yes Is the patient deaf or have difficulty hearing?: No Does the patient have difficulty seeing, even when wearing glasses/contacts?: No Does the patient have difficulty concentrating, remembering, or making decisions?: No Patient able to express need for assistance with ADLs?: Yes Does the patient have difficulty dressing or bathing?: No Independently performs ADLs?: Yes (appropriate for developmental age) Does the patient have difficulty walking or climbing stairs?: No Weakness of Legs: Both Weakness of Arms/Hands: None  Permission Sought/Granted                  Emotional Assessment Appearance:: Appears older than stated age Attitude/Demeanor/Rapport: Gracious   Orientation: : Oriented to Self, Oriented to Place, Oriented to  Time, Oriented to Situation   Psych Involvement: No (comment)  Admission diagnosis:  Cellulitis of toe of left foot [L03.032] Peripheral artery disease ([place]) [I73.9] Patient Active Problem List   Diagnosis Date Noted  . Atherosclerosis of native arteries of extremity with intermittent claudication ([place]) 08/23/2019  . Cellulitis 08/22/2019  . Swelling of limb 08/17/2019  . Pain in limb 08/17/2019  .  NICM (nonischemic cardiomyopathy) ([place]) 07/01/2019  . CHF (congestive heart failure) ([place]) 07/01/2019  . Malignant neoplasm of right lung ([place]) 04/18/2016  . CAD (coronary artery disease) 04/01/2016  . Adenocarcinoma ([place]) 04/01/2016  . Skin cyst 12/26/2015  . GERD (gastroesophageal reflux disease) 06/16/2015  . Abscess of back 06/06/2015  . Vertigo 03/27/2015  . Carpal tunnel syndrome 10/28/2014  . Status post cholecystectomy 09/29/2014  . Disease of digestive  tract 09/29/2014  . Cardiomyopathy ([place]) 09/26/2014  . Type 2 diabetes mellitus without complications ([place])   . Sleep apnea   . Spinal stenosis, lumbar region, with neurogenic claudication 06/07/2014  . Lumbar stenosis with neurogenic claudication 06/07/2014  . Abnormal gait 08/20/2012  . H/O total knee replacement 08/20/2012  . Arthritis of knee, degenerative 08/20/2012  . Pacemaker-St.Jude 08/03/2012  . Cardiac conduction disorder 06/19/2012  . Acid reflux 06/18/2012  . 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 Triggering of digit 03/25/2012  . Hyperlipidemia 12/23/2011  . Essential hypertension 03/23/2011  . Atrioventricular block, complete ([place]) 03/23/2011  . Complete atrioventricular block ([place]) 03/23/2011   PCP:  Margo Common, PA Pharmacy:   [place], [place] - [address] [place] [address] Phone: [phone] Fax: [phone]  [place], [place] - [address] [place] [address] Phone: [phone] Fax: [phone]  [facility] [place] [place] [facility] [employer] [facility] [place] [address] Phone: [phone] Fax: [phone]     Social Determinants of Health (SDOH) Interventions    Readmission Risk Interventions No flowsheet data found.

## 2019-08-24 NOTE — TOC Initial Note (Signed)
Transition of Care [facility]) - Initial/Assessment Note    Patient Details  Name: Howard [name] MRN: [id number] Date of Birth: November 12, 1931  Transition of Care [employer]) CM/SW Contact:    Beverly Sessions, RN Phone Number: 08/24/2019, 10:07 AM  Clinical Narrative:                  Attempted to meet with patient to discuss recommendation of home health.  Patient off the floor for CT.  Will follow up at a later time       Patient Goals and CMS Choice        Expected Discharge Plan and Services                                                Prior Living Arrangements/Services                       Activities of Daily Living Home Assistive Devices/Equipment: Crutches ADL Screening (condition at time of admission) Patient's cognitive ability adequate to safely complete daily activities?: Yes Is the patient deaf or have difficulty hearing?: No Does the patient have difficulty seeing, even when wearing glasses/contacts?: No Does the patient have difficulty concentrating, remembering, or making decisions?: No Patient able to express need for assistance with ADLs?: Yes Does the patient have difficulty dressing or bathing?: No Independently performs ADLs?: Yes (appropriate for developmental age) Does the patient have difficulty walking or climbing stairs?: No Weakness of Legs: Both Weakness of Arms/Hands: None  Permission Sought/Granted                  Emotional Assessment              Admission diagnosis:  Cellulitis of toe of left foot [L03.032] Peripheral artery disease ([place]) [I73.9] Patient Active Problem List   Diagnosis Date Noted  . Atherosclerosis of native arteries of extremity with intermittent claudication ([place]) 08/23/2019  . Cellulitis 08/22/2019  . Swelling of limb 08/17/2019  . Pain in limb 08/17/2019  . NICM (nonischemic cardiomyopathy) ([place]) 07/01/2019  . CHF (congestive heart failure) ([place]) 07/01/2019  . Malignant neoplasm of  right lung ([place]) 04/18/2016  . CAD (coronary artery disease) 04/01/2016  . Adenocarcinoma ([place]) 04/01/2016  . Skin cyst 12/26/2015  . GERD (gastroesophageal reflux disease) 06/16/2015  . Abscess of back 06/06/2015  . Vertigo 03/27/2015  . Carpal tunnel syndrome 10/28/2014  . Status post cholecystectomy 09/29/2014  . Disease of digestive tract 09/29/2014  . Cardiomyopathy ([place]) 09/26/2014  . Type 2 diabetes mellitus without complications ([place])   . Sleep apnea   . Spinal stenosis, lumbar region, with neurogenic claudication 06/07/2014  . Lumbar stenosis with neurogenic claudication 06/07/2014  . Abnormal gait 08/20/2012  . H/O total knee replacement 08/20/2012  . Arthritis of knee, degenerative 08/20/2012  . Pacemaker-St.Jude 08/03/2012  . Cardiac conduction disorder 06/19/2012  . Acid reflux 06/18/2012  . 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 Triggering of digit 03/25/2012  . Hyperlipidemia 12/23/2011  . Essential hypertension 03/23/2011  . Atrioventricular block, complete ([place]) 03/23/2011  . Complete atrioventricular block ([place]) 03/23/2011   PCP:  Margo Common, PA Pharmacy:   [place], [place] - [address] [place] [address] Phone: [phone] Fax: [phone]  [place], [place] [address]  [place] [address] Phone: [phone] Fax: [phone]  [facility] [place] [facility] [address] [place] [address] Phone: [phone] Fax: [phone]     Social Determinants of Health (SDOH) Interventions    Readmission Risk Interventions No flowsheet data found.

## 2019-08-24 NOTE — Progress Notes (Signed)
Patient coughed up bright red blood at 0930. Patient coughed into napkin but estimated amount is one tablespoon. Additionally, it appears there is a hernia in lower abdomen when patient coughs. I spoke to Dr. Leslye Peer who recommended a rate/dose change for heparin. Heparin is now running at 56ml/h and patient is getting a CT scan and an x-ray.    Marry Guan, RN 08/24/2019 10:12 AM

## 2019-08-25 ENCOUNTER — Other Ambulatory Visit (INDEPENDENT_AMBULATORY_CARE_PROVIDER_SITE_OTHER): Payer: Self-pay | Admitting: Nurse Practitioner

## 2019-08-25 ENCOUNTER — Inpatient Hospital Stay: Payer: Medicare Other

## 2019-08-25 ENCOUNTER — Encounter: Payer: Self-pay | Admitting: Anesthesiology

## 2019-08-25 ENCOUNTER — Encounter
Admission: EM | Disposition: A | Discharge status: Home / Self Care | Payer: Self-pay | Source: Home / Self Care | Attending: Internal Medicine

## 2019-08-25 ENCOUNTER — Inpatient Hospital Stay: Payer: Medicare Other | Admitting: Anesthesiology

## 2019-08-25 DIAGNOSIS — I70262 Atherosclerosis of native arteries of extremities with gangrene, left leg: Secondary | ICD-10-CM

## 2019-08-25 DIAGNOSIS — I70223 Atherosclerosis of native arteries of extremities with rest pain, bilateral legs: Secondary | ICD-10-CM

## 2019-08-25 DIAGNOSIS — I10 Essential (primary) hypertension: Secondary | ICD-10-CM

## 2019-08-25 DIAGNOSIS — I700 Atherosclerosis of aorta: Secondary | ICD-10-CM

## 2019-08-25 DIAGNOSIS — I714 Abdominal aortic aneurysm, without rupture: Secondary | ICD-10-CM

## 2019-08-25 DIAGNOSIS — I701 Atherosclerosis of renal artery: Secondary | ICD-10-CM

## 2019-08-25 HISTORY — PX: ENDARTERECTOMY FEMORAL: SHX5804

## 2019-08-25 HISTORY — PX: ABDOMINAL AORTIC ENDOVASCULAR STENT GRAFT: SHX5707

## 2019-08-25 HISTORY — PX: INSERTION OF ILIAC STENT: SHX6256

## 2019-08-25 HISTORY — PX: ANGIOPLASTY: SHX39

## 2019-08-25 HISTORY — PX: ENDOVASCULAR REPAIR/STENT GRAFT: CATH118280

## 2019-08-25 LAB — CBC
HCT: 37.8 % — ABNORMAL LOW (ref 39.0–52.0)
Hemoglobin: 12.6 g/dL — ABNORMAL LOW (ref 13.0–17.0)
MCH: 26.9 pg (ref 26.0–34.0)
MCHC: 33.3 g/dL (ref 30.0–36.0)
MCV: 80.8 fL (ref 80.0–100.0)
Platelets: 228 10*3/uL (ref 150–400)
RBC: 4.68 MIL/uL (ref 4.22–5.81)
RDW: 14.5 % (ref 11.5–15.5)
WBC: 6.2 10*3/uL (ref 4.0–10.5)
nRBC: 0 % (ref 0.0–0.2)

## 2019-08-25 LAB — PROTIME-INR
INR: 1.1 (ref 0.8–1.2)
Prothrombin Time: 14.3 seconds (ref 11.4–15.2)

## 2019-08-25 LAB — BASIC METABOLIC PANEL
Anion gap: 11 (ref 5–15)
BUN: 18 mg/dL (ref 8–23)
CO2: 21 mmol/L — ABNORMAL LOW (ref 22–32)
Calcium: 8.9 mg/dL (ref 8.9–10.3)
Chloride: 104 mmol/L (ref 98–111)
Creatinine, Ser: 0.85 mg/dL (ref 0.61–1.24)
GFR calc Af Amer: >60 mL/min (ref >60)
GFR calc non Af Amer: >60 mL/min (ref >60)
Glucose, Bld: 133 mg/dL — ABNORMAL HIGH (ref 70–99)
Potassium: 3.8 mmol/L (ref 3.5–5.1)
Sodium: 136 mmol/L (ref 135–145)

## 2019-08-25 LAB — MRSA PCR SCREENING: MRSA by PCR: NEGATIVE

## 2019-08-25 LAB — GLUCOSE, CAPILLARY
Glucose-Capillary: 138 mg/dL — ABNORMAL HIGH (ref 70–99)
Glucose-Capillary: 108 mg/dL — ABNORMAL HIGH (ref 70–99)
Glucose-Capillary: 186 mg/dL — ABNORMAL HIGH (ref 70–99)
Glucose-Capillary: 193 mg/dL — ABNORMAL HIGH (ref 70–99)
Glucose-Capillary: 209 mg/dL — ABNORMAL HIGH (ref 70–99)

## 2019-08-25 LAB — ABO/RH: ABO/RH(D): O POS

## 2019-08-25 LAB — HEPARIN LEVEL (UNFRACTIONATED): Heparin Unfractionated: 0.28 IU/mL — ABNORMAL LOW (ref 0.30–0.70)

## 2019-08-25 LAB — MAGNESIUM: Magnesium: 1.7 mg/dL (ref 1.7–2.4)

## 2019-08-25 SURGERY — INSERTION, ENDOVASCULAR STENT GRAFT, AORTA, ABDOMINAL
Anesthesia: General | Laterality: Right

## 2019-08-25 MED ORDER — ONDANSETRON HCL 4 MG/2ML IJ SOLN
INTRAMUSCULAR | Status: AC
  Filled 2019-08-25: qty 2

## 2019-08-25 MED ORDER — EVICEL 5 ML EX KIT
PACK | CUTANEOUS | Status: DC | PRN
  Administered 2019-08-25: 5 mL

## 2019-08-25 MED ORDER — SUCCINYLCHOLINE CHLORIDE 20 MG/ML IJ SOLN
INTRAMUSCULAR | Status: DC | PRN
  Administered 2019-08-25: 120 mg via INTRAVENOUS

## 2019-08-25 MED ORDER — SODIUM CHLORIDE 0.9 % IV SOLN
INTRAVENOUS | Status: DC | PRN
  Administered 2019-08-25: 25 ug/min via INTRAVENOUS

## 2019-08-25 MED ORDER — PROPOFOL 10 MG/ML IV BOLUS
INTRAVENOUS | Status: AC
  Filled 2019-08-25: qty 20

## 2019-08-25 MED ORDER — PROPOFOL 10 MG/ML IV BOLUS
INTRAVENOUS | Status: DC | PRN
  Administered 2019-08-25: 100 mg via INTRAVENOUS

## 2019-08-25 MED ORDER — FENTANYL CITRATE (PF) 100 MCG/2ML IJ SOLN
INTRAMUSCULAR | Status: DC | PRN
  Administered 2019-08-25: 50 ug via INTRAVENOUS
  Administered 2019-08-25 (×2): 25 ug via INTRAVENOUS
  Administered 2019-08-25: 50 ug via INTRAVENOUS
  Administered 2019-08-25 (×2): 25 ug via INTRAVENOUS

## 2019-08-25 MED ORDER — SUCCINYLCHOLINE CHLORIDE 20 MG/ML IJ SOLN
INTRAMUSCULAR | Status: AC
  Filled 2019-08-25: qty 1

## 2019-08-25 MED ORDER — ROCURONIUM BROMIDE 50 MG/5ML IV SOLN
INTRAVENOUS | Status: AC
  Filled 2019-08-25: qty 1

## 2019-08-25 MED ORDER — FENTANYL CITRATE (PF) 100 MCG/2ML IJ SOLN
INTRAMUSCULAR | Status: AC
  Filled 2019-08-25: qty 2

## 2019-08-25 MED ORDER — DEXMEDETOMIDINE HCL 200 MCG/2ML IV SOLN
INTRAVENOUS | Status: DC | PRN
  Administered 2019-08-25: 4 ug via INTRAVENOUS

## 2019-08-25 MED ORDER — DEXMEDETOMIDINE HCL IN NACL 80 MCG/20ML IV SOLN
INTRAVENOUS | Status: AC
  Filled 2019-08-25: qty 20

## 2019-08-25 MED ORDER — GLYCOPYRROLATE 0.2 MG/ML IJ SOLN
INTRAMUSCULAR | Status: AC
  Filled 2019-08-25: qty 1

## 2019-08-25 MED ORDER — LIDOCAINE HCL (CARDIAC) PF 100 MG/5ML IV SOSY
PREFILLED_SYRINGE | INTRAVENOUS | Status: DC | PRN
  Administered 2019-08-25: 80 mg via INTRAVENOUS

## 2019-08-25 MED ORDER — MIDAZOLAM HCL 2 MG/2ML IJ SOLN
INTRAMUSCULAR | Status: AC
  Filled 2019-08-25: qty 2

## 2019-08-25 MED ORDER — METOPROLOL SUCCINATE ER 25 MG PO TB24
25.0000 mg | ORAL_TABLET | Freq: Every day | ORAL | Status: DC
  Administered 2019-08-25 – 2019-08-27 (×3): 25 mg via ORAL
  Filled 2019-08-25 (×4): qty 1

## 2019-08-25 MED ORDER — SUGAMMADEX SODIUM 500 MG/5ML IV SOLN
INTRAVENOUS | Status: DC | PRN
  Administered 2019-08-25 (×2): 200 mg via INTRAVENOUS

## 2019-08-25 MED ORDER — DEXAMETHASONE SODIUM PHOSPHATE 10 MG/ML IJ SOLN
INTRAMUSCULAR | Status: AC
  Filled 2019-08-25: qty 1

## 2019-08-25 MED ORDER — CEFAZOLIN SODIUM-DEXTROSE 2-3 GM-%(50ML) IV SOLR
INTRAVENOUS | Status: DC | PRN
  Administered 2019-08-25 (×2): 2 g via INTRAVENOUS

## 2019-08-25 MED ORDER — HEPARIN SODIUM (PORCINE) 5000 UNIT/ML IJ SOLN
INTRAMUSCULAR | Status: AC
  Filled 2019-08-25: qty 1

## 2019-08-25 MED ORDER — METOPROLOL TARTRATE 5 MG/5ML IV SOLN
INTRAVENOUS | Status: DC | PRN
  Administered 2019-08-25 (×2): 2 mg via INTRAVENOUS
  Administered 2019-08-25: 1 mg via INTRAVENOUS
  Administered 2019-08-25: 2 mg via INTRAVENOUS

## 2019-08-25 MED ORDER — HEPARIN SODIUM (PORCINE) 1000 UNIT/ML IJ SOLN
INTRAMUSCULAR | Status: DC | PRN
  Administered 2019-08-25: 2000 [IU] via INTRAVENOUS
  Administered 2019-08-25: 3000 [IU] via INTRAVENOUS
  Administered 2019-08-25: 7000 [IU] via INTRAVENOUS

## 2019-08-25 MED ORDER — FENTANYL CITRATE (PF) 100 MCG/2ML IJ SOLN: 25.0000 ug | INTRAMUSCULAR | Status: DC | PRN

## 2019-08-25 MED ORDER — ALBUMIN HUMAN 5 % IV SOLN
INTRAVENOUS | Status: AC
  Filled 2019-08-25: qty 250

## 2019-08-25 MED ORDER — ALBUMIN HUMAN 5 % IV SOLN
INTRAVENOUS | Status: DC | PRN
  Administered 2019-08-25 (×2): via INTRAVENOUS

## 2019-08-25 MED ORDER — CHLORHEXIDINE GLUCONATE CLOTH 2 % EX PADS
6.0000 | MEDICATED_PAD | Freq: Every day | CUTANEOUS | Status: DC
  Administered 2019-08-26: 13:00:00 6 via TOPICAL

## 2019-08-25 MED ORDER — ONDANSETRON HCL 4 MG/2ML IJ SOLN
INTRAMUSCULAR | Status: DC | PRN
  Administered 2019-08-25: 4 mg via INTRAVENOUS

## 2019-08-25 MED ORDER — METOPROLOL TARTRATE 5 MG/5ML IV SOLN
INTRAVENOUS | Status: AC
  Filled 2019-08-25: qty 10

## 2019-08-25 MED ORDER — EPHEDRINE SULFATE 50 MG/ML IJ SOLN
INTRAMUSCULAR | Status: DC | PRN
  Administered 2019-08-25: 2.5 mg via INTRAVENOUS
  Administered 2019-08-25: 5 mg via INTRAVENOUS

## 2019-08-25 MED ORDER — ACETAMINOPHEN 10 MG/ML IV SOLN
INTRAVENOUS | Status: AC
  Filled 2019-08-25: qty 100

## 2019-08-25 MED ORDER — PHENYLEPHRINE HCL (PRESSORS) 10 MG/ML IV SOLN
INTRAVENOUS | Status: DC | PRN
  Administered 2019-08-25 (×8): 100 ug via INTRAVENOUS

## 2019-08-25 MED ORDER — ONDANSETRON HCL 4 MG/2ML IJ SOLN: 4.0000 mg | Freq: Once | INTRAMUSCULAR | Status: DC | PRN

## 2019-08-25 MED ORDER — LACTATED RINGERS IV SOLN
INTRAVENOUS | Status: DC | PRN
  Administered 2019-08-25: 12:00:00 via INTRAVENOUS

## 2019-08-25 MED ORDER — DEXAMETHASONE SODIUM PHOSPHATE 10 MG/ML IJ SOLN
INTRAMUSCULAR | Status: DC | PRN
  Administered 2019-08-25: 10 mg via INTRAVENOUS

## 2019-08-25 MED ORDER — GLYCOPYRROLATE 0.2 MG/ML IJ SOLN
INTRAMUSCULAR | Status: DC | PRN
  Administered 2019-08-25: 0.2 mg via INTRAVENOUS

## 2019-08-25 MED ORDER — ROCURONIUM BROMIDE 100 MG/10ML IV SOLN
INTRAVENOUS | Status: DC | PRN
  Administered 2019-08-25: 40 mg via INTRAVENOUS
  Administered 2019-08-25 (×3): 20 mg via INTRAVENOUS
  Administered 2019-08-25: 10 mg via INTRAVENOUS

## 2019-08-25 MED ORDER — ACETAMINOPHEN 10 MG/ML IV SOLN
INTRAVENOUS | Status: DC | PRN
  Administered 2019-08-25: 1000 mg via INTRAVENOUS

## 2019-08-25 SURGICAL SUPPLY — 123 items
"PENCIL ELECTRO HAND CTR " (MISCELLANEOUS) ×4 IMPLANT
APPLIER CLIP 11 MED OPEN (CLIP)
APPLIER CLIP 13 LRG OPEN (CLIP)
APPLIER CLIP 9.375 SM OPEN (CLIP)
BAG COUNTER SPONGE EZ (MISCELLANEOUS) ×4 IMPLANT
BAG DECANTER FOR FLEXI CONT (MISCELLANEOUS) ×6 IMPLANT
BAG ISOLATATION DRAPE 20X20 ST (DRAPES) IMPLANT
BALLN DORADO 10X80X80 (BALLOONS) ×12
BALLN LUTONIX 018 5X150X130 (BALLOONS) ×6
BALLN ULTRVRSE 7X80X75 (BALLOONS) ×6
BALLN ULTRVRSE 8X60X75C (BALLOONS) ×6
BALLOON DORADO 10X80X80 (BALLOONS) ×2 IMPLANT
BALLOON LUTONIX 018 5X150X130 (BALLOONS) ×1 IMPLANT
BALLOON ULTRVRSE 7X80X75 (BALLOONS) ×1 IMPLANT
BALLOON ULTRVRSE 8X60X75C (BALLOONS) ×1 IMPLANT
BLADE SURG 15 STRL LF DISP TIS (BLADE) ×10 IMPLANT
BLADE SURG 15 STRL SS (BLADE) ×2
BLADE SURG SZ11 CARB STEEL (BLADE) ×6 IMPLANT
BOOT SUTURE AID YELLOW STND (SUTURE) ×12 IMPLANT
BRUSH SCRUB EZ  4% CHG (MISCELLANEOUS) ×1
BRUSH SCRUB EZ 4% CHG (MISCELLANEOUS) ×5 IMPLANT
CANISTER SUCT 1200ML W/VALVE (MISCELLANEOUS) ×6 IMPLANT
CATH ACCU-VU SIZ PIG 5F 70CM (CATHETERS) ×2 IMPLANT
CATH BALLN CODA 9X100X32 (BALLOONS) ×2 IMPLANT
CATH BEACON 5 .035 40 KMP TP (CATHETERS) ×1 IMPLANT
CATH BEACON 5 .035 65 KMP TIP (CATHETERS) ×2 IMPLANT
CATH BEACON 5 .038 40 KMP TP (CATHETERS) ×1
CATH EMBOLECTOMY 3X80 (CATHETERS) ×2 IMPLANT
CHLORAPREP W/TINT 26 (MISCELLANEOUS) ×8 IMPLANT
CLIP APPLIE 11 MED OPEN (CLIP) ×4 IMPLANT
CLIP APPLIE 13 LRG OPEN (CLIP) ×4 IMPLANT
CLIP APPLIE 9.375 SM OPEN (CLIP) ×4 IMPLANT
COVER WAND RF STERILE (DRAPES) ×6 IMPLANT
DERMABOND ADVANCED (GAUZE/BANDAGES/DRESSINGS) ×2
DERMABOND ADVANCED .7 DNX12 (GAUZE/BANDAGES/DRESSINGS) ×10 IMPLANT
DEVICE PRESTO INFLATION (MISCELLANEOUS) ×4 IMPLANT
DEVICE TORQUE .025-.038 (MISCELLANEOUS) ×2 IMPLANT
DRAPE INCISE IOBAN 66X45 STRL (DRAPES) ×6 IMPLANT
DRAPE ISOLATE BAG 20X20 STRL (DRAPES)
DRYSEAL FLEXSHEATH 12FR 33CM (SHEATH) ×1
DRYSEAL FLEXSHEATH 16FR 33CM (SHEATH) ×1
ELECT CAUTERY BLADE 6.4 (BLADE) ×12 IMPLANT
ELECT REM PT RETURN 9FT ADLT (ELECTROSURGICAL) ×12
ELECTRODE REM PT RTRN 9FT ADLT (ELECTROSURGICAL) ×10 IMPLANT
EXCLUDER TNK 23X14.5MMX12CM (Endovascular Graft) ×1 IMPLANT
EXCLUDER TRUNK 23X14.5MMX12CM (Endovascular Graft) ×6 IMPLANT
GAUZE 4X4 16PLY RFD (DISPOSABLE) ×8 IMPLANT
GLIDEWIRE ADV .035X180CM (WIRE) ×2 IMPLANT
GLIDEWIRE STIFF .35X180X3 HYDR (WIRE) ×2 IMPLANT
GLOVE BIO SURGEON STRL SZ7 (GLOVE) ×16 IMPLANT
GLOVE BIO SURGEON STRL SZ8 (GLOVE) ×6 IMPLANT
GLOVE INDICATOR 7.5 STRL GRN (GLOVE) ×8 IMPLANT
GOWN STRL REUS W/ TWL LRG LVL3 (GOWN DISPOSABLE) ×11 IMPLANT
GOWN STRL REUS W/ TWL XL LVL3 (GOWN DISPOSABLE) ×11 IMPLANT
GOWN STRL REUS W/TWL LRG LVL3 (GOWN DISPOSABLE) ×3
GOWN STRL REUS W/TWL XL LVL3 (GOWN DISPOSABLE) ×3
HEMOSTAT SURGICEL 2X3 (HEMOSTASIS) ×12 IMPLANT
IV NS 1000ML (IV SOLUTION) ×1
IV NS 1000ML BAXH (IV SOLUTION) ×5 IMPLANT
IV NS 500ML (IV SOLUTION) ×1
IV NS 500ML BAXH (IV SOLUTION) ×5 IMPLANT
KIT TURNOVER KIT A (KITS) ×6 IMPLANT
LABEL OR SOLS (LABEL) ×6 IMPLANT
LEG CONTRALATERAL 16X12X14 (Vascular Products) ×1 IMPLANT
LEG CONTRALATERAL 16X14.5X10 (Vascular Products) ×2 IMPLANT
LOOP RED MAXI  1X406MM (MISCELLANEOUS) ×4
LOOP VESSEL MAXI 1X406 RED (MISCELLANEOUS) ×20 IMPLANT
LOOP VESSEL MINI 0.8X406 BLUE (MISCELLANEOUS) ×10 IMPLANT
LOOPS BLUE MINI 0.8X406MM (MISCELLANEOUS) ×2
NDL HYPO 25X1 1.5 SAFETY (NEEDLE) ×4 IMPLANT
NDL SAFETY ECLIPSE 18X1.5 (NEEDLE) ×5 IMPLANT
NEEDLE HYPO 18GX1.5 SHARP (NEEDLE) ×1
NEEDLE HYPO 25X1 1.5 SAFETY (NEEDLE) ×6 IMPLANT
NS IRRIG 500ML POUR BTL (IV SOLUTION) ×6 IMPLANT
PACK ANGIOGRAPHY (CUSTOM PROCEDURE TRAY) ×2 IMPLANT
PACK BASIN MAJOR ARMC (MISCELLANEOUS) ×6 IMPLANT
PACK UNIVERSAL (MISCELLANEOUS) ×6 IMPLANT
PATCH CAROTID ECM VASC 1X10 (Prosthesis & Implant Heart) ×2 IMPLANT
PENCIL ELECTRO HAND CTR (MISCELLANEOUS) ×6 IMPLANT
SHEATH BRITE TIP 8FRX11 (SHEATH) ×4 IMPLANT
SHEATH DRYSEAL FLEX 12FR 33CM (SHEATH) ×1 IMPLANT
SHEATH DRYSEAL FLEX 16FR 33CM (SHEATH) ×1 IMPLANT
SPONGE LAP 18X18 RF (DISPOSABLE) ×12 IMPLANT
STENT GRAFT CONTRALAT 16X12X14 (Vascular Products) ×1 IMPLANT
STENT LIFESTREAM 10X58X80 (Permanent Stent) ×2 IMPLANT
STENT LIFESTREAM 7X16X80 (Permanent Stent) ×2 IMPLANT
STENT VIABAHN 13X100X120 (Permanent Stent) ×2 IMPLANT
STENT VIABAHN 6X150X120 (Permanent Stent) ×2 IMPLANT
SUT MNCRL 4-0 (SUTURE) ×4
SUT MNCRL 4-0 27XMFL (SUTURE) ×20
SUT MNCRL+ 5-0 UNDYED PC-3 (SUTURE) ×10 IMPLANT
SUT MONOCRYL 5-0 (SUTURE) ×2
SUT PROLENE 5 0 RB 1 DA (SUTURE) ×24 IMPLANT
SUT PROLENE 6 0 BV (SUTURE) ×52 IMPLANT
SUT PROLENE 7 0 BV 1 (SUTURE) ×12 IMPLANT
SUT SILK 2 0 (SUTURE) ×1
SUT SILK 2-0 18XBRD TIE 12 (SUTURE) ×5 IMPLANT
SUT SILK 3 0 (SUTURE) ×1
SUT SILK 3-0 18XBRD TIE 12 (SUTURE) ×5 IMPLANT
SUT SILK 4 0 (SUTURE) ×1
SUT SILK 4-0 18XBRD TIE 12 (SUTURE) ×5 IMPLANT
SUT VIC AB 2-0 CT1 (SUTURE) ×36 IMPLANT
SUT VIC AB 2-0 CT1 27 (SUTURE) ×2
SUT VIC AB 2-0 CT1 TAPERPNT 27 (SUTURE) ×10 IMPLANT
SUT VIC AB 3-0 SH 27 (SUTURE) ×1
SUT VIC AB 3-0 SH 27X BRD (SUTURE) ×5 IMPLANT
SUT VIC AB 4-0 SH 27 (SUTURE) ×2
SUT VIC AB 4-0 SH 27XANBCTRL (SUTURE) ×10 IMPLANT
SUT VICRYL+ 3-0 36IN CT-1 (SUTURE) ×36 IMPLANT
SUTURE MNCRL 4-0 27XMF (SUTURE) ×20 IMPLANT
SYR 10ML LL (SYRINGE) ×6 IMPLANT
SYR 20ML LL LF (SYRINGE) ×12 IMPLANT
SYR 3ML LL SCALE MARK (SYRINGE) ×6 IMPLANT
SYR 5ML LL (SYRINGE) ×6 IMPLANT
SYR BULB 3OZ (MISCELLANEOUS) ×6 IMPLANT
SYR TB 1ML LUER SLIP (SYRINGE) ×2 IMPLANT
TOWEL OR 17X26 4PK STRL BLUE (TOWEL DISPOSABLE) ×6 IMPLANT
TRAY FOLEY MTR SLVR 16FR STAT (SET/KITS/TRAYS/PACK) ×6 IMPLANT
TUBING CONTRAST HIGH PRESS 72 (TUBING) ×2 IMPLANT
WIRE AMPLATZ SSTIFF .035X260CM (WIRE) ×4 IMPLANT
WIRE G 018X200 V18 (WIRE) ×2 IMPLANT
WIRE MAGIC TOR.035 180C (WIRE) ×2 IMPLANT
WIRE STIFF LUNDERQUIST 260MM (WIRE) ×2 IMPLANT

## 2019-08-25 NOTE — Anesthesia Post-op Follow-up Note (Signed)
Anesthesia QCDR form completed.        

## 2019-08-25 NOTE — Progress Notes (Signed)
[name], male   DOB: 1931/01/18, 85 y.o.   MRN: [id number]  [facility] [employer] PROGRESS NOTE  KHALIB FENDLEY XKG:[id number] DOB: 30-Jun-1931 DOA: 08/22/2019 PCP: Margo Common, PA  HPI/Subjective: Patient states that he coughed up some old blood which was dark.  Patient's toe is still little painful but better than when he came in.  No abdominal pain.  Objective: Vitals:   08/25/19 0501 08/25/19 1026  BP: (!) 110/58 (!) 145/71  Pulse: 88 95  Resp: 16 16  Temp: 98 F (36.7 C) 97.6 F (36.4 C)  SpO2: 93% 97%    Intake/Output Summary (Last 24 hours) at 08/25/2019 1418 Last data filed at 08/25/2019 1350 Gross per 24 hour  Intake 1219.01 ml  Output 1175 ml  Net 44.01 ml   Filed Weights   08/22/19 2041 08/23/19 1557  Weight: 82.5 kg 82.5 kg    ROS: Review of Systems  Constitutional: Negative for chills and fever.  Eyes: Negative for blurred vision.  Respiratory: Positive for cough and hemoptysis. Negative for shortness of breath and wheezing.   Cardiovascular: Negative for chest pain.  Gastrointestinal: Negative for abdominal pain, constipation, diarrhea, nausea and vomiting.  Genitourinary: Negative for dysuria.  Musculoskeletal: Positive for joint pain.  Neurological: Negative for dizziness and headaches.   Exam: Physical Exam  Constitutional: He is oriented to person, place, and time.  HENT:  Nose: No mucosal edema.  Mouth/Throat: No oropharyngeal exudate or posterior oropharyngeal edema.  Eyes: Pupils are equal, round, and reactive to light. Conjunctivae, EOM and lids are normal.  Neck: No JVD present. Carotid bruit is not present. No edema present. No thyroid mass and no thyromegaly present.  Cardiovascular: S1 normal and S2 normal. Exam reveals no gallop.  No murmur heard. Pulses:      Dorsalis pedis pulses are 1+ on the right side and 1+ on the left side.  Respiratory: No respiratory distress. He has no wheezes. He has no rhonchi. He has no rales.   GI: Soft. Bowel sounds are normal. There is no abdominal tenderness. A hernia is present. Hernia confirmed positive in the ventral area.  Musculoskeletal:     Right shoulder: He exhibits no swelling.  Lymphadenopathy:    He has no cervical adenopathy.  Neurological: He is alert and oriented to person, place, and time. No cranial nerve deficit.  Skin: Skin is warm. Nails show no clubbing.  Left first toe slight redness but faded and decreased from admission.  Psychiatric: He has a normal mood and affect.    Data Reviewed: Basic Metabolic Panel: Recent Labs  Lab 08/21/19 1310 08/22/19 1405 08/23/19 0555 08/24/19 0301 08/25/19 0319  NA 134* 134* 137 134* 136  K 4.6 4.3 3.9 3.9 3.8  CL 101 100 104 104 104  CO2 23 24 24  21* 21*  GLUCOSE 125* 148* 132* 130* 133*  BUN 21 23 20 22 18   CREATININE 0.98 1.06 0.92 0.99 0.85  CALCIUM 9.6 9.5 9.0 8.7* 8.9  MG  --   --   --   --  1.7   Liver Function Tests: Recent Labs  Lab 08/21/19 1310  AST 21  ALT 18  ALKPHOS 48  BILITOT 0.4  PROT 7.6  ALBUMIN 4.4   CBC: Recent Labs  Lab 08/21/19 1310 08/22/19 1405 08/23/19 0555 08/24/19 0301 08/25/19 0319  WBC 6.3 6.2 4.9 5.2 6.2  NEUTROABS 2.9 2.8  --   --   --   HGB 13.7 13.6 13.2 12.3*  12.6*  HCT 42.0 41.0 39.9 37.1* 37.8*  MCV 82.4 81.7 81.1 81.5 80.8  PLT 265 272 228 215 228    CBG: Recent Labs  Lab 08/24/19 1149 08/24/19 1637 08/24/19 2138 08/25/19 0740 08/25/19 1024  GLUCAP 132* 195* 123* 138* 108*    Recent Results (from the past 240 hour(s))  SARS Coronavirus 2 [facility] order, Performed in [facility] hospital lab) Nasopharyngeal Nasopharyngeal Swab     Status: None   Collection Time: 08/22/19  6:35 PM   Specimen: Nasopharyngeal Swab  Result Value Ref Range Status   SARS Coronavirus 2 NEGATIVE NEGATIVE Final    Comment: (NOTE) If result is NEGATIVE SARS-CoV-2 target nucleic acids are NOT DETECTED. The SARS-CoV-2 RNA is generally detectable in upper and  lower  respiratory specimens during the acute phase of infection. The lowest  concentration of SARS-CoV-2 viral copies this assay can detect is 250  copies / mL. A negative result does not preclude SARS-CoV-2 infection  and should not be used as the sole basis for treatment or other  patient management decisions.  A negative result may occur with  improper specimen collection / handling, submission of specimen other  than nasopharyngeal swab, presence of viral mutation(s) within the  areas targeted by this assay, and inadequate number of viral copies  (<250 copies / mL). A negative result must be combined with clinical  observations, patient history, and epidemiological information. If result is POSITIVE SARS-CoV-2 target nucleic acids are DETECTED. The SARS-CoV-2 RNA is generally detectable in upper and lower  respiratory specimens dur ing the acute phase of infection.  Positive  results are indicative of active infection with SARS-CoV-2.  Clinical  correlation with patient history and other diagnostic information is  necessary to determine patient infection status.  Positive results do  not rule out bacterial infection or co-infection with other viruses. If result is PRESUMPTIVE POSTIVE SARS-CoV-2 nucleic acids MAY BE PRESENT.   A presumptive positive result was obtained on the submitted specimen  and confirmed on repeat testing.  While 2019 novel coronavirus  (SARS-CoV-2) nucleic acids may be present in the submitted sample  additional confirmatory testing may be necessary for epidemiological  and / or clinical management purposes  to differentiate between  SARS-CoV-2 and other Sarbecovirus currently known to infect humans.  If clinically indicated additional testing with an alternate test  methodology [phone]) is advised. The SARS-CoV-2 RNA is generally  detectable in upper and lower respiratory sp ecimens during the acute  phase of infection. The expected result is  Negative. Fact Sheet for Patients:  StrictlyIdeas.no Fact Sheet for Healthcare Providers: BankingDealers.co.za This test is not yet approved or cleared by the [place] FDA and has been authorized for detection and/or diagnosis of SARS-CoV-2 by FDA under an Emergency Use Authorization (EUA).  This EUA will remain in effect (meaning this test can be used) for the duration of the COVID-19 declaration under Section 564(b)(1) of the Act, 21 U.S.C. section 360bbb-3(b)(1), unless the authorization is terminated or revoked sooner. Performed at [facility], [address] [place], [place] [address]   Culture, blood (routine x 2)     Status: None (Preliminary result)   Collection Time: 08/22/19  7:52 PM   Specimen: BLOOD  Result Value Ref Range Status   Specimen Description BLOOD RIGHT FOREARM  Final   Special Requests   Final    BOTTLES DRAWN AEROBIC AND ANAEROBIC Blood Culture adequate volume   Culture   Final    NO  GROWTH 3 DAYS Performed at [facility], [address] [place], [place] [address]    Report Status PENDING  Incomplete  Culture, blood (routine x 2)     Status: None (Preliminary result)   Collection Time: 08/22/19  7:52 PM   Specimen: BLOOD  Result Value Ref Range Status   Specimen Description BLOOD LEFT ANTECUBITAL  Final   Special Requests   Final    BOTTLES DRAWN AEROBIC AND ANAEROBIC Blood Culture results may not be optimal due to an excessive volume of blood received in culture bottles   Culture   Final    NO GROWTH 3 DAYS Performed at [facility], [address] [place], [place] [address]    Report Status PENDING  Incomplete      Scheduled Meds: . [MAR Hold] aspirin EC  81 mg Oral Daily  . [MAR Hold] fentaNYL  1 patch Transdermal Q72H  . [MAR Hold] fluticasone  2 spray Each Nare Daily  . [MAR Hold] insulin aspart  0-15 Units Subcutaneous TID WC  . [MAR Hold] insulin aspart   0-5 Units Subcutaneous QHS  . [MAR Hold] isosorbide mononitrate  30 mg Oral Daily  . [MAR Hold] pantoprazole  40 mg Oral Daily  . [MAR Hold] simvastatin  40 mg Oral QHS  . [MAR Hold] sodium chloride flush  3 mL Intravenous Q12H  . [MAR Hold] spironolactone  50 mg Oral q morning - 10a   Continuous Infusions: . sodium chloride 50 mL/hr at 08/24/19 2352  . [MAR Hold] sodium chloride Stopped (08/24/19 1547)  . [MAR Hold] cefTRIAXone (ROCEPHIN)  IV 0 mL/hr at 08/24/19 1447    Assessment/Plan:  1. Ischemic toe versus cellulitis.  Patient on heparin drip as of this morning and that will be held for procedure today.  Continue antibiotics. 2. Relative hypotension on IV fluids.  Blood pressure did come up with holding Entresto.  Continue to hold Entresto for now and restart Toprol tonight. 3. Peripheral vascular disease.  Patient having bilateral femoral endarterectomy and aortic stent graft placement. 4. Type 2 diabetes mellitus.  Hold Glucophage with angiogram.  Sliding scale coverage. 5. Chronic systolic congestive heart failure.  No signs of heart failure currently.  On gentle IV fluids.  Patient lying flat.  Continue to hold Entresto.  Try to restart Toprol tonight. 6. Hemoptysis likely secondary to heparin drip.  Chest x-ray negative for pneumonia.  The CT scan caught the bottom of the lungs which showed the patient does have pulmonary nodules.  This will have to be followed up as outpatient.   Code Status:     Code Status Orders  (From admission, onward)         Start     Ordered   08/22/19 1826  Full code  Continuous     08/22/19 1825        Code Status History    Date Active Date Inactive Code Status Order ID Comments User Context   07/09/2019 1057 07/09/2019 1725 Full Code [id number]  Deboraha Sprang, MD Inpatient   08/26/2014 1001 08/26/2014 1543 Full Code [id number]  [place], Peter M, MD Inpatient   06/07/2014 1324 06/07/2014 2149 Full Code [id number]  [name], MD Inpatient    05/30/2014 0838 05/31/2014 0337 Full Code [id number]  Marybelle Killings, MD [facility]   Advance Care Planning Activity    Advance Directive Documentation     Most Recent Value  Type of Advance Directive  Healthcare Power of [place], Living will  Pre-existing out of facility DNR order (yellow form or pink MOST form)  -  "MOST" Form in Place?  -     Family Communication: Spoke with patient's son and wife at the bedside prior to procedure. Disposition Plan: We will be up to vascular surgery and how he recovers from today's procedures  Consultants:  Vascular surgery  Procedures:  Angiogram lower extremities  Antibiotics:  Rocephin  Time spent: 28 minutes  [facility]          [name], male   DOB: [birth year], 85 y.o.   MRN: [id number]

## 2019-08-25 NOTE — Progress Notes (Signed)
[place] for Heparin  Indication: peripheral vascular disease  Allergies  Allergen Reactions  . Sulfa Antibiotics Rash   Patient Measurements: Height: 5' 9.5" (176.5 cm) Weight: 181 lb 14.1 oz (82.5 kg) IBW/kg (Calculated) : 71.85 Heparin Dosing Weight: 82.5 kg   Vital Signs: Temp: 98.3 F (36.8 C) (09/01 1945) Temp Source: Oral (09/01 1945) BP: 107/57 (09/01 1945) Pulse Rate: 81 (09/01 1945)  Labs: Recent Labs    08/22/19 2204 08/23/19 0555  08/24/19 0301 08/24/19 1915 08/25/19 0319  HGB  --  13.2  --  12.3*  --  12.6*  HCT  --  39.9  --  37.1*  --  37.8*  PLT  --  228  --  215  --  228  APTT 48*  --   --   --   --   --   LABPROT 14.3 13.7  --   --   --  14.3  INR 1.1 1.1  --   --   --  1.1  HEPARINUNFRC  --  0.34   < > 0.25* 0.32 0.28*  CREATININE  --  0.92  --  0.99  --  0.85   < > = values in this interval not displayed.   Estimated Creatinine Clearance: 63.4 mL/min (by C-G formula based on SCr of 0.85 mg/dL).  Medical History: Past Medical History:  Diagnosis Date  . Arthritis   . Bell palsy   . Cancer [facility])    prostate and skin  . Chronic combined systolic and diastolic CHF, NYHA class 1 ([place])    a. 07/2014 Echo: EF 35-40%, Gr 1 DD.  Marland Kitchen Complete heart block ([place])    a. 11/2010 s/p SJM 2210 [facility] DC PPM, ser# [id number].  . Depression   . Diabetes mellitus without complication ([place])   . Fall 11-10-14  . GERD (gastroesophageal reflux disease)   . History of prostate cancer   . Hyperlipidemia   . Hypertension   . LBBB (left bundle branch block)   . Left-sided Bell's palsy   . Lung cancer ([place]) 2016  . NICM (nonischemic cardiomyopathy) ([place])    a. 07/2014 Echo: EF 35-40%, mid-apicalanteroseptal DK, Gr 1 DD, mild-mod dil LA.  . Non-obstructive CAD    a. 07/2014 Abnl MV;  b. 08/2014 Cath: LM nl, LAD 30p, RI 40p, LCX nl, OM1 40, RCA dominant 30p, 70d-->Med Rx.  Marland Kitchen Poor balance   . Presence of permanent cardiac pacemaker    . Sleep apnea    a. cpap  . Vertigo   . WPW (Wolff-Parkinson-White syndrome)    a. S/P RFCA 1991.    Medications:  Medications Prior to Admission  Medication Sig Dispense Refill Last Dose  . aspirin EC 81 MG tablet Take 81 mg by mouth daily.   08/22/2019 at 0800  . chlorthalidone (HYGROTON) 25 MG tablet Take 1 tablet (25 mg total) by mouth daily. 90 tablet 3 08/22/2019 at 0800  . Cholecalciferol (VITAMIN D3) 1000 UNITS CAPS Take 1,000 Units by mouth daily.    08/22/2019 at 0800  . doxycycline (VIBRAMYCIN) 100 MG capsule Take 1 capsule (100 mg total) by mouth 2 (two) times daily for 7 days. 14 capsule 0 08/22/2019 at 0800  . ENTRESTO 97-103 MG TAKE 1 TABLET TWICE A DAY (Patient taking differently: Take 1 tablet by mouth 2 (two) times a day. ) 180 tablet 3 08/22/2019 at 0800  . fentaNYL (DURAGESIC) 12 MCG/HR Place 1 patch onto the skin  every 3 (three) days. 10 patch 0   . fluticasone (FLONASE) 50 MCG/ACT nasal spray Place 2 sprays into both nostrils daily.   08/22/2019 at 0800  . isosorbide mononitrate (IMDUR) 30 MG 24 hr tablet TAKE 1 TABLET BY MOUTH DAILY (Patient taking differently: Take 30 mg by mouth daily. ) 90 tablet 1 08/21/2019 at 2100  . metFORMIN (GLUCOPHAGE) 500 MG tablet Take 1 tablet (500 mg total) by mouth 2 (two) times daily with a meal. 180 tablet 3 08/22/2019 at 0800  . metoprolol succinate (TOPROL-XL) 25 MG 24 hr tablet Take 1 tablet (25 mg) by mouth once daily at bedtime (Patient taking differently: Take 25 mg by mouth at bedtime. ) 90 tablet 3 08/21/2019 at 2100  . montelukast (SINGULAIR) 10 MG tablet Take 10 mg by mouth daily as needed (sneezing/allergies.).    Unknown at PRN  . Multiple Vitamin (MULTIVITAMIN WITH MINERALS) TABS tablet Take 1 tablet by mouth daily. One-A-Day Multivitamin   08/22/2019 at 0800  . mupirocin ointment (BACTROBAN) 2 % Apply to affected area 3 times daily 22 g 0   . omeprazole (PRILOSEC) 40 MG capsule Take 1 capsule (40 mg total) by mouth 2 (two) times  daily as needed. (Patient taking differently: Take 40 mg by mouth 2 (two) times a day. ) 180 capsule 3 08/22/2019 at 0800  . simvastatin (ZOCOR) 40 MG tablet TAKE 1 TABLET AT BEDTIME (Patient taking differently: Take 40 mg by mouth at bedtime. ) 90 tablet 0 08/21/2019 at 2100  . spironolactone (ALDACTONE) 50 MG tablet Take 1 tablet (50 mg) by mouth once daily in the morning (Patient taking differently: Take 50 mg by mouth every morning. ) 90 tablet 3 08/22/2019 at 0800    Assessment: Pharmacy has been consulted for Heparin Drip initiation on 85yo patient admitted with cellulitis of left foot/toes. Patient scheduled for angiography of the left lower limb in the AM. Does not take any anticoagulant therapy PTA. Will initiate therapy immediately. Baseline labs have been ordered and are pending.  Heparin Course 08/31 @ 0500 HL 0.34 therapeutic. Will continue current rate and will recheck HL @ 1400 08/31 @ 1406 HL 0.18 subtherapeutic. 09/01 @ 0301 HL 0.25 subtherapeutic: rate increased to 1150 units/hr 09/01 1010 patient coughing up blood: attending requested rate be lowered back to 1000 units/hr 09/01:  HL @ 1915 = 0.32 0901 @ 0319 HL = 0.28, this is at the new goal set by vascular surg (see below)  Goal of Therapy:  Heparin level 0.2-0.7 units/ml Monitor platelets by anticoagulation protocol: Yes   Plan:   After discussing the hemoptysis with the vascular surgery team the goal heparin level was lowered to 0.2-0.7 U/mL  Continue heparin infusion at 1000 units/hr  Heparin will be d/c at 0600 9/2 before surgery  F/U heparin plan after surgery  CBC in am   Hart Robinsons A 08/25/2019,4:07 AM

## 2019-08-25 NOTE — Op Note (Addendum)
OPERATIVE NOTE   PROCEDURE: 1. Cutdown for placement of endoprosthesis right femoral artery 2. Left common femoral artery and profunda femoris artery endarterectomy with core matrix patch angioplasty 3. Catheter placement into aorta from bilateral femoral approaches 4. Catheter placement into left popliteal artery from left common femoral approach with left lower extremity angiogram 5. Placement of a 6 mm diameter by 15 cm length via bond stent left superficial femoral artery postdilated with a 5 mm diameter Lutonix drug-coated angioplasty balloon 6. Catheter placement into right renal artery from left femoral approach 7. Right renal artery stent placement with 7 mm diameter by 16 mm length lifestream stent 8. Placement of a 23 mm diameter 14 cm length Gore Excluder Endoprosthesis main body right with a 14 mm diameter by 12 cm length left contralateral limb 9. Stent placement in the right external iliac artery with 10 mm diameter by 58 mm length lifestream stent for occlusive disease 10. Gore excluder limb extension with a 12 mm diameter by 14 cm length limb to the distal right external iliac artery for occlusive disease 11. Viabahn stent placement to the left external iliac artery with 13 mm diameter by 10 cm length stent for occlusive disease 12. Fogarty embolectomy left SFA   PRE-OPERATIVE DIAGNOSIS: AAA, right renal artery stenosis, severe aortoiliac occlusive disease with left iliac occlusion and right iliac stenosis, left SFA stenosis, rest pain and pregangrenous changes to the left great toe  POST-OPERATIVE DIAGNOSIS: same  SURGEON: Leotis Pain, MD and Hortencia Pilar, MD - Co-surgeons  ANESTHESIA: General  ESTIMATED BLOOD LOSS: 300 cc  FINDING(S): 1.  AAA, severe occlusive disease of the iliac arteries bilaterally, high-grade greater than 85% stenosis of the left SFA, high-grade greater than 90% stenosis of the inferior right renal artery  SPECIMEN(S): Left common femoral  and profunda femoris artery plaque  INDICATIONS:   Howard [name] is a 85 y.o. male who presents with an ischemic left foot with pregangrenous changes and rest pain.  He has a left iliac occlusion and severe right iliac artery stenosis.  He has a medium size aneurysm and repair of his occlusive disease is really not possible without repairing the aneurysm as well.  He also has renal artery stenosis, hypertension, and multiple other medical comorbidities. The anatomy was suitable for endovascular repair with concomitant revascularization which would be extensive.  Risks and benefits of repair in an endovascular fashion were discussed and informed consent was obtained. Co-surgeons are used to expedite the procedure and reduce operative time as bilateral work needs to be done.  DESCRIPTION: After obtaining full informed written consent, the patient was brought back to the operating room and placed supine upon the operating table.  The patient received IV antibiotics prior to induction.  After obtaining adequate anesthesia, the patient was prepped and draped in the standard fashion for endovascular AAA repair.  We then began by gaining access to both femoral arteries with US guidance with me working on the left and Dr. Delana Meyer working on the right.  Vertical incisions were created overlying both femoral arteries were dissected down to the femoral arteries.  The right femoral arteries found to be mildly diseased but the left femoral artery was found to be extensively diseased and it was decided an endarterectomy would need to be performed for revascularization.  This was not entirely surprising given this was the much more ischemic leg.  Initially, 7000 units of heparin were given systemic anticoagulation with re-dose doses of heparin given as needed  along the way.  The femoral arteries were dissected out and encircled beyond the profunda femoris primary branches bilaterally, several centimeters down the SFA  bilaterally, and in the proximal common femoral artery.  Control was achieved and an arteriotomy was created with 11 blade and extended with Potts scissors in the left common femoral artery.  This was taken up to the proximal common femoral artery at the level of the circumflex vessels.  An extensive endarterectomy needed to be performed on the profunda femoris artery as the disease tracked down beyond the primary bifurcation of the profunda femoris artery.  We were down about 3 cm onto the artery.  The elevator was used to create a plane and the plaque was removed with gentle traction.  A plug of plaque was removed from the proximal superficial femoral artery and from the proximal common femoral artery with a hemostat.  Inflow remained very poor but we knew we had an iliac occlusion proximally.  After we had completed the endarterectomy and removed all loose flex, the left femoral artery was left open and the right femoral artery was accessed with a Seldinger needle and 8 French sheaths were placed bilaterally.  On the left, we began with an antegrade 8 French sheath and perform left lower extremity angiogram.  This confirmed a high-grade stenosis in the left mid superficial femoral artery.  A Kumpe catheter and a V 18 wire were used to cross the lesion without difficulty and taken down to the popliteal artery.  Catheter was then removed.  A 6 mm diameter by 15 cm length via bond stent was deployed and postdilated with a 5 mm diameter Lutonix drug-coated balloon with excellent angiographic completion result and less than 10% residual stenosis in the left SFA.  I then took the 8 [place] sheath retrograde up into the iliac artery.  I was able to cross the left iliac occlusion with a Kumpe catheter and an advantage wire and confirm intraluminal flow in the aorta.  Dr. Delana Meyer got a pigtail catheter up into the aorta and an aortogram was performed.  This demonstrated the expected finding of the abdominal aortic aneurysm  and a greater than 90% stenosis of the lower of the 2 right renal arteries.  We elected to stent the right renal artery to improve its flow and hopefully improve his blood pressure as well as to protect the kidney from the stent graft.  From the left femoral approach, I was able to cannulate the right renal artery without difficulty with a Kumpe catheter and cross the lesion without difficulty.  Selective imaging showed good flow in the renal artery beyond the proximal lesion.  We had to predilate the iliac arteries with 7 mm balloons bilaterally to be able to get the larger sheaths up, and for the right external iliac artery lesion had to place a 10 mm diameter by 58 mm length lifestream stent to open the highly calcific stenosis, but ultimately we got a 16 French sheath up on the right and a 12 French sheath up on the left.  There is extensive occlusive disease bilaterally with near occlusive stenosis in the left external iliac artery, occlusion of the left common iliac artery, and the known stenoses in the right external and common iliac arteries as well.  He essentially had no hypogastric artery flow on either side.  Once the 16 French sheath was up on the right, the main body was placed on the right side and a 23 mm diameter by 14  cm length device was deployed at the base of the lower right renal artery.  I then deployed the right renal artery stent using a 7 mm diameter by 16 mm length lifestream stent inflated to 12 atm.  There is essentially no residual stenosis in the renal artery after the stent and the stent graft was repositioned at the base of the right renal stent.  I then pulled the 12 French sheath back and selectively cannulated the contralateral limb without difficulty drawing a pigtail catheter to confirm successful cannulation.  We then extended a 14 mm diameter by 12 cm length left iliac extension limb to just above the diminutive left hypogastric artery.  There remained high-grade residual  stenosis in the left external iliac artery and there is still occlusive disease in the right external iliac artery with a small bridge between the stent graft and the lifestream stent as well as some disease below the lifestream stent.  A 12 mm diameter by 14 cm length right iliac extension limb was taken down to just above the femoral head on the right.  A 13 meters diameter by 10 cm length via bond stent was deployed down to just above the femoral head on the left.  The Coda balloon was used for the proximal seal zone and junction points and noncompliant 10 mm balloons were used to treat the entirety of the iliac vessels bilaterally.  Completion imaging was then performed the pigtail catheter placed back into the aorta.  Both right renal arteries including the 1 with previously placed stent were widely patent.  The left renal artery is widely patent.  A type II endoleak was detected on completion angiography.  At this point the aneurysm repair was successful with good flow down to the femorals.  The sheaths were removed bilaterally. We then closed the right femoral artery arteriotomy site with a series of interrupted 6-0 Prolene's.  On the left side, a core matrix patch was brought onto the field.  Prior to closing the arteriotomy, we flushed forward with good inflow.  On release of control from the profunda femoris artery there was some inflow although it was somewhat sluggish.  Initially, the SFA did not have any backbleeding.  We then brought a 3 Fogarty embolectomy balloon onto the field and passed this about 45 to 50 cm down the SFA into the popliteal artery and pulled back the balloon.  With this, there was return of backbleeding from the SFA which was reasonably good.  We then proceeded with closing the arteriotomy with the patch.  Arteriotomy was closed with running 6-0 Prolene start the proximal endpoint and run to the midportion.  The patch was then cut and beveled to an appropriate length to match the  arteriotomy and started in the mid left profunda femoris artery and run to complete the suture line.  The vessel was flushed and de-aired prior to release control.  6-0 Prolene patch sutures were used for hemostasis as needed.  There was a good pulse in the SFA and the profunda femoris artery beyond the endarterectomy after release of control. At this point we elected to terminate the procedure. We placed Surgicel and epi cells bilaterally.  The wounds were then closed with 2 layers of 2-0 Vicryl, and then 2 layers of 3-0 Vicryl. The skin incision was closed with a 4-0 Monocryl. Dermabond and sterile dressing were placed. The patient was taken to the recovery room in stable condition having tolerated the procedure well.  COMPLICATIONS: none  CONDITION: stable  Leotis Pain  08/25/2019, 5:14 PM   This note was created with Dragon Medical transcription system. Any errors in dictation are purely unintentional.

## 2019-08-25 NOTE — Care Management Important Message (Signed)
Important Message  Patient Details  Name: Howard [name] MRN: [id number] Date of Birth: 06-27-1931   Medicare Important Message Given:  Yes     Dannette Barbara 08/25/2019, 10:38 AM

## 2019-08-25 NOTE — Progress Notes (Signed)
ANTICOAGULATION CONSULT NOTE   Pharmacy Consult for Heparin  Indication: peripheral vascular disease  Patient Measurements: Height: 5' 9.5" (176.5 cm) Weight: 181 lb 14.1 oz (82.5 kg) IBW/kg (Calculated) : 71.85 Heparin Dosing Weight: 82.5 kg   Vital Signs: Temp: 98 F (36.7 C) (09/02 0501) Temp Source: Oral (09/02 0501) BP: 110/58 (09/02 0501) Pulse Rate: 88 (09/02 0501)  Labs: Recent Labs    08/22/19 2204 08/23/19 0555  08/24/19 0301 08/24/19 1915 08/25/19 0319  HGB  --  13.2  --  12.3*  --  12.6*  HCT  --  39.9  --  37.1*  --  37.8*  PLT  --  228  --  215  --  228  APTT 48*  --   --   --   --   --   LABPROT 14.3 13.7  --   --   --  14.3  INR 1.1 1.1  --   --   --  1.1  HEPARINUNFRC  --  0.34   < > 0.25* 0.32 0.28*  CREATININE  --  0.92  --  0.99  --  0.85   < > = values in this interval not displayed.   Estimated Creatinine Clearance: 63.4 mL/min (by C-G formula based on SCr of 0.85 mg/dL).  Medical History: Past Medical History:  Diagnosis Date  . Arthritis   . Bell palsy   . Cancer [facility])    prostate and skin  . Chronic combined systolic and diastolic CHF, NYHA class 1 ([place])    a. 07/2014 Echo: EF 35-40%, Gr 1 DD.  Marland Kitchen Complete heart block ([place])    a. 11/2010 s/p SJM 2210 [facility] DC PPM, ser# [id number].  . Depression   . Diabetes mellitus without complication ([place])   . Fall 11-10-14  . GERD (gastroesophageal reflux disease)   . History of prostate cancer   . Hyperlipidemia   . Hypertension   . LBBB (left bundle branch block)   . Left-sided Bell's palsy   . Lung cancer ([place]) 2016  . NICM (nonischemic cardiomyopathy) ([place])    a. 07/2014 Echo: EF 35-40%, mid-apicalanteroseptal DK, Gr 1 DD, mild-mod dil LA.  . Non-obstructive CAD    a. 07/2014 Abnl MV;  b. 08/2014 Cath: LM nl, LAD 30p, RI 40p, LCX nl, OM1 40, RCA dominant 30p, 70d-->Med Rx.  Marland Kitchen Poor balance   . Presence of permanent cardiac pacemaker   . Sleep apnea    a. cpap  . Vertigo   . WPW  (Wolff-Parkinson-White syndrome)    a. S/P RFCA 1991.    Medications:  Medications Prior to Admission  Medication Sig Dispense Refill Last Dose  . aspirin EC 81 MG tablet Take 81 mg by mouth daily.   08/22/2019 at 0800  . chlorthalidone (HYGROTON) 25 MG tablet Take 1 tablet (25 mg total) by mouth daily. 90 tablet 3 08/22/2019 at 0800  . Cholecalciferol (VITAMIN D3) 1000 UNITS CAPS Take 1,000 Units by mouth daily.    08/22/2019 at 0800  . doxycycline (VIBRAMYCIN) 100 MG capsule Take 1 capsule (100 mg total) by mouth 2 (two) times daily for 7 days. 14 capsule 0 08/22/2019 at 0800  . ENTRESTO 97-103 MG TAKE 1 TABLET TWICE A DAY (Patient taking differently: Take 1 tablet by mouth 2 (two) times a day. ) 180 tablet 3 08/22/2019 at 0800  . fentaNYL (DURAGESIC) 12 MCG/HR Place 1 patch onto the skin every 3 (three) days. 10 patch 0   . fluticasone (  FLONASE) 50 MCG/ACT nasal spray Place 2 sprays into both nostrils daily.   08/22/2019 at 0800  . isosorbide mononitrate (IMDUR) 30 MG 24 hr tablet TAKE 1 TABLET BY MOUTH DAILY (Patient taking differently: Take 30 mg by mouth daily. ) 90 tablet 1 08/21/2019 at 2100  . metFORMIN (GLUCOPHAGE) 500 MG tablet Take 1 tablet (500 mg total) by mouth 2 (two) times daily with a meal. 180 tablet 3 08/22/2019 at 0800  . metoprolol succinate (TOPROL-XL) 25 MG 24 hr tablet Take 1 tablet (25 mg) by mouth once daily at bedtime (Patient taking differently: Take 25 mg by mouth at bedtime. ) 90 tablet 3 08/21/2019 at 2100  . montelukast (SINGULAIR) 10 MG tablet Take 10 mg by mouth daily as needed (sneezing/allergies.).    Unknown at PRN  . Multiple Vitamin (MULTIVITAMIN WITH MINERALS) TABS tablet Take 1 tablet by mouth daily. One-A-Day Multivitamin   08/22/2019 at 0800  . mupirocin ointment (BACTROBAN) 2 % Apply to affected area 3 times daily 22 g 0   . omeprazole (PRILOSEC) 40 MG capsule Take 1 capsule (40 mg total) by mouth 2 (two) times daily as needed. (Patient taking differently: Take  40 mg by mouth 2 (two) times a day. ) 180 capsule 3 08/22/2019 at 0800  . simvastatin (ZOCOR) 40 MG tablet TAKE 1 TABLET AT BEDTIME (Patient taking differently: Take 40 mg by mouth at bedtime. ) 90 tablet 0 08/21/2019 at 2100  . spironolactone (ALDACTONE) 50 MG tablet Take 1 tablet (50 mg) by mouth once daily in the morning (Patient taking differently: Take 50 mg by mouth every morning. ) 90 tablet 3 08/22/2019 at 0800    Assessment: Pharmacy has been consulted for Heparin Drip initiation on 85yo patient admitted with cellulitis of left foot/toes. Patient scheduled for angiography of the left lower limb in the AM. Does not take any anticoagulant therapy PTA. Will initiate therapy immediately. Baseline labs have been ordered and are pending.  Heparin Course 08/31 @ 0500 HL 0.34 therapeutic. Will continue current rate and will recheck HL @ 1400 08/31 @ 1406 HL 0.18 subtherapeutic. 09/01 @ 0301 HL 0.25 subtherapeutic: rate increased to 1150 units/hr 09/01 1010 patient coughing up blood: attending requested rate be lowered back to 1000 units/hr 09/01:  HL @ 1915 = 0.32 0902 HL 0319 0.28: continue, infusion stopped prior to surgery 0902 PM: B/L femoral endarterectomy. Aortic graft placement, L SFA stent placement, endovascular stent graft insertion. The patient received [place] of 20355 units of heparin   Goal of Therapy:  Heparin level 0.2-0.7 units/ml Monitor platelets by anticoagulation protocol: Yes   Plan:   The patient will require transfer to CCU following the above procedure  Dr Lucky Cowboy has not yet completed the surgery, therefore pharmacy will wait for instructions from Dr Lucky Cowboy regarding continuing the heparin infusion   Dallie Piles, PharmD 08/25/2019,8:30 AM

## 2019-08-25 NOTE — Transfer of Care (Signed)
Immediate Anesthesia Transfer of Care Note  Patient: Howard [name]  Procedure(s) Performed: ABDOMINAL AORTIC ENDOVASCULAR STENT GRAFT INSERTION OF ILIAC STENT (Bilateral ) ENDARTERECTOMY FEMORAL (Left ) ANGIOPLASTY (Left ) ENDOVASCULAR REPAIR/STENT GRAFT (Right )  Patient Location: PACU  Anesthesia Type:General  Level of Consciousness: sedated  Airway & Oxygen Therapy: Patient Spontanous Breathing and Patient connected to face mask oxygen  Post-op Assessment: Report given to RN and Post -op Vital signs reviewed and stable  Post vital signs: Reviewed and stable  Last Vitals:  Vitals Value Taken Time  BP 109/57 08/25/19 1713  Temp    Pulse 103 08/25/19 1717  Resp 19 08/25/19 1717  SpO2 93 % 08/25/19 1717  Vitals shown include unvalidated device data.  Last Pain:  Vitals:   08/25/19 1715  TempSrc:   PainSc: (P) Asleep         Complications: No apparent anesthesia complications

## 2019-08-25 NOTE — Anesthesia Procedure Notes (Signed)
Procedure Name: Intubation Performed by: Kelton Pillar, CRNA Pre-anesthesia Checklist: Patient identified, Emergency Drugs available, Suction available and Patient being monitored Patient Re-evaluated:Patient Re-evaluated prior to induction Oxygen Delivery Method: Circle system utilized Preoxygenation: Pre-oxygenation with 100% oxygen Induction Type: IV induction Ventilation: Mask ventilation without difficulty Laryngoscope Size: McGraph and 3 Grade View: Grade I Tube type: Oral Tube size: 7.0 mm Number of attempts: 1 Airway Equipment and Method: Stylet Placement Confirmation: ETT inserted through vocal cords under direct vision,  CO2 detector,  positive ETCO2 and breath sounds checked- equal and bilateral Secured at: 22 cm Tube secured with: Tape Dental Injury: Teeth and Oropharynx as per pre-operative assessment

## 2019-08-25 NOTE — H&P (Signed)
[facility] History & Physical Update  The patient was interviewed and re-examined.  The patient's previous History and Physical has been reviewed and is unchanged.  There is no change in the plan of care. We plan to proceed with the scheduled procedure.  Leotis Pain, MD  08/25/2019, 11:31 AM

## 2019-08-25 NOTE — Progress Notes (Signed)
PT Cancellation Note  Patient Details Name: DAISEAN BRODHEAD MRN: [id number] DOB: 08/24/1931   Cancelled Treatment:    Reason Eval/Treat Not Completed: Patient at procedure or test/unavailable.  Chart reviewed.  Pt currently off floor for procedure.  Will re-attempt PT treatment session at a later date/time as medically appropriate.  Leitha Bleak, PT 08/25/19, 10:34 AM [phone]

## 2019-08-25 NOTE — Op Note (Signed)
OPERATIVE NOTE   PROCEDURE: 1. Open exposure bilateral common femoral arteries 2. Left common femoral and profunda femoris endarterectomy with core matrix patch angioplasty 3. Placement of a 23 x 14 x 12 Gore Excluder Endoprosthesis main body  with a 14 x 10 contralateral limb 4. Angioplasty and stent placement right renal artery with a 7 mm x 60 mm lifestream stent 5. Angioplasty and stent placement bilateral external iliac arteries with a 12 x 14 excluder endoprosthesis on the right and a 13 x 10 via bond stent on the left 6. Angioplasty and stent placement with a 6 mm via bond stent left superficial femoral artery. 7. Fogarty thrombectomy of the left superficial femoral artery and profunda femoris arteries  PRE-OPERATIVE DIAGNOSIS: AAA  POST-OPERATIVE DIAGNOSIS: same  SURGEON: Hortencia Pilar, MD and Leotis Pain, MD - Co-surgeons  ANESTHESIA: general  ESTIMATED BLOOD LOSS: 300 cc  FINDING(S): 1.  AAA; greater than 90% right renal artery stenosis; greater than 90% stenosis of the bilateral external iliac arteries  SPECIMEN(S): Plaque from the left common femoral and profunda femoris to pathology  INDICATIONS:   Howard [name] is a 85 y.o. y.o. male who presents with worsening ischemic symptoms of his left lower extremity.  He is found to have multiple vascular problems including a abdominal aortic aneurysm approximately 4 cm in diameter bilateral common and external iliac artery occlusive disease with occlusion on the left and greater than 90% stenoses on the right.  He also has an associated greater than 90% stenosis of his right renal artery..  DESCRIPTION: After obtaining full informed written consent, the patient was brought back to the operating room and placed supine upon the operating table.  The patient received IV antibiotics prior to induction.  After obtaining adequate anesthesia, the patient was prepped and draped in the standard fashion for endovascular AAA repair.    Co-surgeons are required because this is a complex bilateral procedure with work being performed simultaneously from both the right femoral and left femoral approach.  This also expedites the procedure making a shorter operative time reducing complications and improving patient safety.  We then began by gaining exposing both femoral arteries through vertical incisions in both groins.  With myself working on the patient's right and Dr. dew working on the patient's left the dissection was carried down through the soft tissues to expose the common femoral artery the distalmost aspect of the external iliac artery was then exposed above the circumflex branches and the dissection was carried down exposing the first several centimeters of the common femoral circumferentially as well as the profunda femoris circumferentially.  Vesseloops were placed around all branches.  The patient was then given a total of 9000 units of intravenous heparin throughout the procedure the initial bolus was 6000.  The left common femoral was then addressed it was clamped proximally and distally arteriotomy was made and under direct visualization endarterectomy was performed of the common femoral extending down into the profunda femoris for approximately 2 to 3 cm.  Feathered edge was obtained in the distal profunda.  The SFA was treated with an eversion technique at its origin.  A 8 French sheath was then inserted under direct visualization in the SFA and hand-injection of contrast was used to create a distal arterial runoff.  The V 18 wire was then negotiated through the SFA stenosis which was greater than 80% diffusely in its midsection and a 6 mm via bond deployed and postdilated with a 5 mm Dorado balloon.  Follow-up imaging demonstrated patency of the SFA with preservation of distal runoff and less than 10% residual stenosis.  Contrast filling at the origin of the SFA also demonstrated a clean feathered edge from the eversion  endarterectomy.  Attention was now turned to treating the aortic aneurysm.  With Dr. dew working on the left utilizing an 8 [place] sheath he was able to negotiate a advantage wire across the occlusion and into the aorta.  8 French sheath was then inserted.  Kumpe catheter was then advanced and hand-injection of contrast demonstrated intraluminal aortic positioning.  At this point I was able to access the right common femoral with a Seldinger needle and advance an advantage wire into the aorta under direct visualization.  Kumpe catheter was advanced hand-injection of contrast verified intraluminal placing an Amplatz Super Stiff wire was then advanced through the Kumpe catheter.  Attempts at placing the 16 French sheath were not successful due to the extensive and profound occlusive disease ultimately this maneuver was accomplished by placing a 10 mm x 58 mm lifestream stent and deploying this across the greatest segment of occlusive disease.  Once this stent was in position I was then able to advance the 16 French sheath into the aorta.  In a similar fashion the 12 French sheath was difficult to advance up the left side and this required several angioplasties to 7 mm and then 8 mm and ultimately we were able to advance the sheath into the aorta.  The Pigtail catheter was placed into the aorta from the left side.  And images were obtained demonstrating the left renal artery was at the L1 level and directly across from it was a superior pole right artery.  The main right renal artery was also visualized and again notation of the 90% ostial stenosis is made.  Working from the left side Dr. dew was able to cannulate the right inferior but dominant renal artery with a Kumpe catheter and a Glidewire catheter was then advanced hand-injection of contrast was used to verify intraluminal positioning as well as the distal anatomy of the artery and to ensure that this was indeed the dominant renal artery.  A 7 mm x 16 mm  lifestream stent was then selected advanced across the lesion.    Using this image, we selected a 23 x 14 x 12 Main body device.  Over a stiff wire, an 16 French sheath was repositioned. The main body was then placed through the 16 French sheath.  The main body was then deployed down to the contralateral limb.  The renal artery stent was then deployed.  Contrast injection demonstrated good apposition of the stent with good position and wide patency and therefore the 12 French sheath and wire in association with a Kumpe catheter were pulled back into the left common iliac  The Kumpe catheter was used to cannulate the contralateral gate without difficulty and successful cannulation was confirmed by twirling the pigtail catheter in the main body.  The main body was then re-constrained adjusted so that it was sitting just below the renal stent and re-deployed successfully.  We then placed a stiff wire and a retrograde arteriogram was performed through the left femoral sheath.  A 14 x 10 contralateral excluder limb was selected and deployed across the left common iliac. The main body deployment was then completed. Based off the angiographic findings, there is still extensive greater than 90% stenosis diffusely throughout both external iliac arteries.  This is outside the aorto common iliac  segment and is being treated for occlusive disease not aneurysmal disease.  After appropriate measurements and retrograde angiography a 12 x 14 Excluder limb was deployed on the right and a 13 x 10 via bond stent graft was deployed on the left bringing the stents down to the level of the circumflex vessels just above the level of the ileal inguinal ligament.  The Coda balloon was then advanced up the left side and the proximal stent graft was treated with a noncompliant inflation followed by the contralateral limb at the gait.  We then advanced a 10 mm x 60 mm Dorado balloons up both the right and left side and angioplastied  the external iliac arteries beginning at the level of the common and extending down to the distal edge of the stent for treatment of the occlusive disease.  Multiple inflations were made inflations were to 16 to 20 atm for approximately 30 seconds each.  Follow-up imaging later demonstrates less than 10% residual stenosis  The pigtail catheter was then replaced and a completion angiogram was performed.   No endoleak was detected on completion angiography. The renal arteries were found to be widely patent.  Right renal stent was in good position with full expansion and good flow.  Bilateral external iliac stents are fully expanded with less than 10% residual stenosis.  At this point we elected to complete the repair of the left common femoral core matrix patch angioplasty was delivered onto the field it was trimmed to the appropriate shape and applied to the common femoral profunda femoris using 6-0 Prolene in a running fashion.  Flushing maneuvers were performed and flow was reestablished first to the profunda femoris and then the superficial femoral.  Prior to completing the suture line backbleeding was checked it was noted to be poor from the superficial femoral and therefore 3 Fogarty was advanced down the superficial femoral 1 pass and subsequently excellent backbleeding was returned.  In similar fashion one pass in the profunda returned excellent backbleeding.  Suture line was completed after flushing maneuvers and flow was reestablished as noted first to the profunda femoris than the SFA.  On the right the 67 French sheath was removed arterial control obtained and then the arteriotomy reapproximated using interrupted 6-0 Prolene sutures a total of 3 sutures were required.  Excellent distal pulses were noted in both the SFA and profunda femoris of the right and left femoral system.  Both femoral incisions were then irrigated copiously Surgicel placed along the arterial suture lines and AdvaSeal instilled.   Both groins were then closed in multiple layers using 2 layers of 2-0 Vicryl in a running fashion followed by 2 layers of 3-0 Vicryl in a running fashion followed by 4-0 Monocryl in a subcuticular fashion and then Dermabond.  After the Dermabond had dried honeycomb dressings were applied.   The patient was taken to the recovery room in stable condition having tolerated the procedure well.  COMPLICATIONS: none  CONDITION: stable  Hortencia Pilar  08/25/2019, 6:05 PM

## 2019-08-25 NOTE — Anesthesia Preprocedure Evaluation (Addendum)
Anesthesia Evaluation  Patient identified by MRN, date of birth, ID band  Reviewed: Allergy & Precautions, NPO status , Patient's Chart, lab work & pertinent test results, reviewed documented beta blocker date and time   Airway Mallampati: II  TM Distance: >3 FB Neck ROM: Full    Dental  (+) Chipped   Pulmonary sleep apnea , former smoker,  Lung CA   Pulmonary exam normal breath sounds clear to auscultation       Cardiovascular hypertension, + CAD and +CHF  + dysrhythmias + pacemaker  Rhythm:Regular  100% paced   Neuro/Psych PSYCHIATRIC DISORDERS Depression  Neuromuscular disease    GI/Hepatic Neg liver ROS, GERD  ,  Endo/Other  diabetes, Well Controlled, Type 2  Renal/GU negative Renal ROS  negative genitourinary   Musculoskeletal  (+) Arthritis , Osteoarthritis,    Abdominal Normal abdominal exam  (+)   Peds negative pediatric ROS (+)  Hematology negative hematology ROS (+)   Anesthesia Other Findings Past Medical History: No date: Arthritis No date: Bell palsy No date: Cancer [facility])     Comment:  prostate and skin No date: Chronic combined systolic and diastolic CHF, NYHA class 1  ([place])     Comment:  a. 07/2014 Echo: EF 35-40%, Gr 1 DD. No date: Complete heart block ([place])     Comment:  a. 11/2010 s/p SJM 2210 [employer] DC PPM, ser# [id number]. No date: Depression No date: Diabetes mellitus without complication ([place]) [birth year]: Fall No date: GERD (gastroesophageal reflux disease) No date: History of prostate cancer No date: Hyperlipidemia No date: Hypertension No date: LBBB (left bundle branch block) No date: Left-sided Bell's palsy 2016: Lung cancer ([place]) No date: NICM (nonischemic cardiomyopathy) ([place])     Comment:  a. 07/2014 Echo: EF 35-40%, mid-apicalanteroseptal DK, Gr              1 DD, mild-mod dil LA. No date: Non-obstructive CAD     Comment:  a. 07/2014 Abnl MV;  b. 08/2014 Cath: LM nl, LAD 30p, RI           40p, LCX nl, OM1 40, RCA dominant 30p, 70d-->Med Rx. No date: Poor balance No date: Presence of permanent cardiac pacemaker No date: Sleep apnea     Comment:  a. cpap No date: Vertigo No date: WPW (Wolff-Parkinson-White syndrome)     Comment:  a. S/P RFCA 1991.  Reproductive/Obstetrics                             Anesthesia Physical  Anesthesia Plan  ASA: IV  Anesthesia Plan: General   Post-op Pain Management:    Induction: Intravenous  PONV Risk Score and Plan:   Airway Management Planned: Oral ETT  Additional Equipment:   Intra-op Plan:   Post-operative Plan: Extubation in OR  Informed Consent: I have reviewed the patients History and Physical, chart, labs and discussed the procedure including the risks, benefits and alternatives for the proposed anesthesia with the patient or authorized representative who has indicated his/her understanding and acceptance.     Dental advisory given  Plan Discussed with: CRNA and Surgeon  Anesthesia Plan Comments: (TIVA)       Anesthesia Quick Evaluation

## 2019-08-26 ENCOUNTER — Encounter: Payer: Self-pay | Admitting: Vascular Surgery

## 2019-08-26 ENCOUNTER — Ambulatory Visit: Admit: 2019-08-26 | Payer: Medicare Other | Admitting: Vascular Surgery

## 2019-08-26 LAB — BASIC METABOLIC PANEL
Anion gap: 8 (ref 5–15)
BUN: 16 mg/dL (ref 8–23)
CO2: 24 mmol/L (ref 22–32)
Calcium: 8.8 mg/dL — ABNORMAL LOW (ref 8.9–10.3)
Chloride: 107 mmol/L (ref 98–111)
Creatinine, Ser: 0.88 mg/dL (ref 0.61–1.24)
GFR calc Af Amer: >60 mL/min (ref >60)
GFR calc non Af Amer: >60 mL/min (ref >60)
Glucose, Bld: 173 mg/dL — ABNORMAL HIGH (ref 70–99)
Potassium: 4.8 mmol/L (ref 3.5–5.1)
Sodium: 139 mmol/L (ref 135–145)

## 2019-08-26 LAB — TYPE AND SCREEN
ABO/RH(D): O POS
Antibody Screen: NEGATIVE
Unit division: 0
Unit division: 0

## 2019-08-26 LAB — CBC
HCT: 35.7 % — ABNORMAL LOW (ref 39.0–52.0)
Hemoglobin: 11.6 g/dL — ABNORMAL LOW (ref 13.0–17.0)
MCH: 26.9 pg (ref 26.0–34.0)
MCHC: 32.5 g/dL (ref 30.0–36.0)
MCV: 82.6 fL (ref 80.0–100.0)
Platelets: 197 10*3/uL (ref 150–400)
RBC: 4.32 MIL/uL (ref 4.22–5.81)
RDW: 14.2 % (ref 11.5–15.5)
WBC: 7.2 10*3/uL (ref 4.0–10.5)
nRBC: 0 % (ref 0.0–0.2)

## 2019-08-26 LAB — GLUCOSE, CAPILLARY
Glucose-Capillary: 129 mg/dL — ABNORMAL HIGH (ref 70–99)
Glucose-Capillary: 155 mg/dL — ABNORMAL HIGH (ref 70–99)
Glucose-Capillary: 142 mg/dL — ABNORMAL HIGH (ref 70–99)
Glucose-Capillary: 128 mg/dL — ABNORMAL HIGH (ref 70–99)

## 2019-08-26 LAB — BPAM RBC
Blood Product Expiration Date: 202010092359
Blood Product Expiration Date: 202010092359
Unit Type and Rh: 5100
Unit Type and Rh: 5100

## 2019-08-26 LAB — PREPARE RBC (CROSSMATCH)

## 2019-08-26 SURGERY — LOWER EXTREMITY ANGIOGRAPHY
Anesthesia: Moderate Sedation | Laterality: Left

## 2019-08-26 MED ORDER — CLOPIDOGREL BISULFATE 75 MG PO TABS
75.0000 mg | ORAL_TABLET | Freq: Every day | ORAL | Status: DC
  Administered 2019-08-26 – 2019-08-28 (×3): 75 mg via ORAL
  Filled 2019-08-26 (×3): qty 1

## 2019-08-26 MED ORDER — VITAMIN D 25 MCG (1000 UNIT) PO TABS
1000.0000 [IU] | ORAL_TABLET | Freq: Every day | ORAL | Status: DC
  Administered 2019-08-26 – 2019-08-28 (×3): 1000 [IU] via ORAL
  Filled 2019-08-26 (×3): qty 1

## 2019-08-26 MED ORDER — CHLORTHALIDONE 25 MG PO TABS
25.0000 mg | ORAL_TABLET | Freq: Every day | ORAL | Status: DC
  Administered 2019-08-26 – 2019-08-28 (×3): 25 mg via ORAL
  Filled 2019-08-26 (×3): qty 1

## 2019-08-26 MED ORDER — ADULT MULTIVITAMIN W/MINERALS CH
1.0000 | ORAL_TABLET | Freq: Every day | ORAL | Status: DC
  Administered 2019-08-26 – 2019-08-27 (×2): 1 via ORAL
  Filled 2019-08-26 (×3): qty 1

## 2019-08-26 MED ORDER — ALUM & MAG HYDROXIDE-SIMETH 200-200-20 MG/5ML PO SUSP
30.0000 mL | ORAL | Status: DC | PRN
  Administered 2019-08-26: 11:00:00 30 mL via ORAL
  Filled 2019-08-26: qty 30

## 2019-08-26 NOTE — Progress Notes (Signed)
Patient ID: Howard [name], male   DOB: 1931/06/25, 85 y.o.   MRN: [id number]  Howard [name] PROGRESS NOTE  Howard [name] OEU:[id number] DOB: 1931-05-24 DOA: 08/22/2019 PCP: Howard Common, PA  HPI/Subjective: Patient seen this morning and he was doing a good.  He did not have any further toe pain.  Did not even have pain in his groin.  Offers no further complaints.  No coughing up any further blood.  Objective: Vitals:   08/26/19 1200 08/26/19 1237  BP: (!) 141/74   Pulse: 91   Resp: 19   Temp:  97.6 F (36.4 C)  SpO2: 98%     Intake/Output Summary (Last 24 hours) at 08/26/2019 1250 Last data filed at 08/26/2019 1118 Gross per 24 hour  Intake 3944.5 ml  Output 1540 ml  Net 2404.5 ml   Filed Weights   08/22/19 2041 08/23/19 1557 08/25/19 1800  Weight: 82.5 kg 82.5 kg 85.5 kg    ROS: Review of Systems  Constitutional: Negative for chills and fever.  Eyes: Negative for blurred vision.  Respiratory: Positive for cough. Negative for hemoptysis, shortness of breath and wheezing.   Cardiovascular: Negative for chest pain.  Gastrointestinal: Negative for abdominal pain, constipation, diarrhea, nausea and vomiting.  Genitourinary: Negative for dysuria.  Musculoskeletal: Negative for joint pain.  Neurological: Negative for dizziness and headaches.   Exam: Physical Exam  Constitutional: He is oriented to person, place, and time.  HENT:  Nose: No mucosal edema.  Mouth/Throat: No oropharyngeal exudate or posterior oropharyngeal edema.  Eyes: Pupils are equal, round, and reactive to light. Conjunctivae, EOM and lids are normal.  Neck: No JVD present. Carotid bruit is not present. No edema present. No thyroid mass and no thyromegaly present.  Cardiovascular: S1 normal and S2 normal. Exam reveals no gallop.  No murmur heard. Pulses:      Dorsalis pedis pulses are 1+ on the right side and 1+ on the left side.  Respiratory: No respiratory distress. He has no wheezes. He has no  rhonchi. He has no rales.  GI: Soft. Bowel sounds are normal. There is no abdominal tenderness. A hernia is present. Hernia confirmed positive in the ventral area.  Musculoskeletal:     Right shoulder: He exhibits no swelling.  Lymphadenopathy:    He has no cervical adenopathy.  Neurological: He is alert and oriented to person, place, and time. No cranial nerve deficit.  Skin: Skin is warm. Nails show no clubbing.  Left first toe only slight redness.  Much better than on admission.  Psychiatric: He has a normal mood and affect.    Data Reviewed: Basic Metabolic Panel: Recent Labs  Lab 08/22/19 1405 08/23/19 0555 08/24/19 0301 08/25/19 0319 08/26/19 0435  NA 134* 137 134* 136 139  K 4.3 3.9 3.9 3.8 4.8  CL 100 104 104 104 107  CO2 24 24 21* 21* 24  GLUCOSE 148* 132* 130* 133* 173*  BUN 23 20 22 18 16   CREATININE 1.06 0.92 0.99 0.85 0.88  CALCIUM 9.5 9.0 8.7* 8.9 8.8*  MG  --   --   --  1.7  --    Liver Function Tests: Recent Labs  Lab 08/21/19 1310  AST 21  ALT 18  ALKPHOS 48  BILITOT 0.4  PROT 7.6  ALBUMIN 4.4   CBC: Recent Labs  Lab 08/21/19 1310 08/22/19 1405 08/23/19 0555 08/24/19 0301 08/25/19 0319 08/26/19 0435  WBC 6.3 6.2 4.9 5.2 6.2 7.2  NEUTROABS 2.9 2.8  --   --   --   --  HGB 13.7 13.6 13.2 12.3* 12.6* 11.6*  HCT 42.0 41.0 39.9 37.1* 37.8* 35.7*  MCV 82.4 81.7 81.1 81.5 80.8 82.6  PLT 265 272 228 215 228 197    CBG: Recent Labs  Lab 08/25/19 1718 08/25/19 1817 08/25/19 2211 08/26/19 0724 08/26/19 1132  GLUCAP 186* 193* 209* 129* 155*    Recent Results (from the past 240 hour(s))  SARS Coronavirus 2 [facility] order, Performed in Howard [name] hospital lab) Nasopharyngeal Nasopharyngeal Swab     Status: None   Collection Time: 08/22/19  6:35 PM   Specimen: Nasopharyngeal Swab  Result Value Ref Range Status   SARS Coronavirus 2 NEGATIVE NEGATIVE Final    Comment: (NOTE) If result is NEGATIVE SARS-CoV-2 target nucleic acids are NOT  DETECTED. The SARS-CoV-2 RNA is generally detectable in upper and lower  respiratory specimens during the acute phase of infection. The lowest  concentration of SARS-CoV-2 viral copies this assay can detect is 250  copies / mL. A negative result does not preclude SARS-CoV-2 infection  and should not be used as the sole basis for treatment or other  patient management decisions.  A negative result may occur with  improper specimen collection / handling, submission of specimen other  than nasopharyngeal swab, presence of viral mutation(s) within the  areas targeted by this assay, and inadequate number of viral copies  (<250 copies / mL). A negative result must be combined with clinical  observations, patient history, and epidemiological information. If result is POSITIVE SARS-CoV-2 target nucleic acids are DETECTED. The SARS-CoV-2 RNA is generally detectable in upper and lower  respiratory specimens dur ing the acute phase of infection.  Positive  results are indicative of active infection with SARS-CoV-2.  Clinical  correlation with patient history and other diagnostic information is  necessary to determine patient infection status.  Positive results do  not rule out bacterial infection or co-infection with other viruses. If result is PRESUMPTIVE POSTIVE SARS-CoV-2 nucleic acids MAY BE PRESENT.   A presumptive positive result was obtained on the submitted specimen  and confirmed on repeat testing.  While 2019 novel coronavirus  (SARS-CoV-2) nucleic acids may be present in the submitted sample  additional confirmatory testing may be necessary for epidemiological  and / or clinical management purposes  to differentiate between  SARS-CoV-2 and other Sarbecovirus currently known to infect humans.  If clinically indicated additional testing with an alternate test  methodology [phone]) is advised. The SARS-CoV-2 RNA is generally  detectable in upper and lower respiratory sp ecimens during  the acute  phase of infection. The expected result is Negative. Fact Sheet for Patients:  StrictlyIdeas.no Fact Sheet for Healthcare Providers: BankingDealers.co.za This test is not yet approved or cleared by the [place] FDA and has been authorized for detection and/or diagnosis of SARS-CoV-2 by FDA under an Emergency Use Authorization (EUA).  This EUA will remain in effect (meaning this test can be used) for the duration of the COVID-19 declaration under Section 564(b)(1) of the Act, 21 U.S.C. section 360bbb-3(b)(1), unless the authorization is terminated or revoked sooner. Performed at [facility], [address] [place], [place] [address]   Culture, blood (routine x 2)     Status: None (Preliminary result)   Collection Time: 08/22/19  7:52 PM   Specimen: BLOOD  Result Value Ref Range Status   Specimen Description BLOOD RIGHT FOREARM  Final   Special Requests   Final    BOTTLES DRAWN AEROBIC AND ANAEROBIC Blood Culture adequate volume  Culture   Final    NO GROWTH 4 DAYS Performed at [facility], [address] [place], [place] [address]    Report Status PENDING  Incomplete  Culture, blood (routine x 2)     Status: None (Preliminary result)   Collection Time: 08/22/19  7:52 PM   Specimen: BLOOD  Result Value Ref Range Status   Specimen Description BLOOD LEFT ANTECUBITAL  Final   Special Requests   Final    BOTTLES DRAWN AEROBIC AND ANAEROBIC Blood Culture results may not be optimal due to an excessive volume of blood received in culture bottles   Culture   Final    NO GROWTH 4 DAYS Performed at [facility], [address] [place], [place] [address]    Report Status PENDING  Incomplete  MRSA PCR Screening     Status: None   Collection Time: 08/25/19 10:13 PM   Specimen: Nasopharyngeal  Result Value Ref Range Status   MRSA by PCR NEGATIVE NEGATIVE Final    Comment:        The  GeneXpert MRSA Assay (FDA approved for NASAL specimens only), is one component of a comprehensive MRSA colonization surveillance program. It is not intended to diagnose MRSA infection nor to guide or monitor treatment for MRSA infections. Performed at [facility], [address] [place], [place] [address]       Scheduled Meds: . aspirin EC  81 mg Oral Daily  . Chlorhexidine Gluconate Cloth  6 each Topical Daily  . chlorthalidone  25 mg Oral Daily  . cholecalciferol  1,000 Units Oral Daily  . clopidogrel  75 mg Oral Daily  . fentaNYL  1 patch Transdermal Q72H  . fluticasone  2 spray Each Nare Daily  . insulin aspart  0-15 Units Subcutaneous TID WC  . insulin aspart  0-5 Units Subcutaneous QHS  . isosorbide mononitrate  30 mg Oral Daily  . metoprolol succinate  25 mg Oral QHS  . multivitamin with minerals  1 tablet Oral Daily  . pantoprazole  40 mg Oral Daily  . simvastatin  40 mg Oral QHS  . sodium chloride flush  3 mL Intravenous Q12H  . spironolactone  50 mg Oral q morning - 10a   Continuous Infusions: . sodium chloride Stopped (08/24/19 1547)  . cefTRIAXone (ROCEPHIN)  IV 0 mL/hr at 08/24/19 1447    Assessment/Plan:  1. Ischemic toe versus cellulitis.  Patient status post bilateral femoral endarterectomy and aortic stent graft placement.  No further pain in the toe with better blood supply down on the legs.  Continue antibiotics for now. 2. Peripheral vascular disease.  Patient status post bilateral femoral endarterectomy and aortic stent graft placement.  Patient started on aspirin and Plavix 3. Hypertension.  Restarted Toprol.  Continue to hold Entresto but hopefully can restart at some point. 4. Type 2 diabetes mellitus.  Hold Glucophage with angiogram.  Sliding scale coverage. 5. Chronic systolic congestive heart failure.  No signs of heart failure currently.  Patient lying flat.  Continue to hold Entresto.  Restarted Toprol last night. 6. Hemoptysis  likely secondary to heparin drip.  Chest x-ray negative for pneumonia.  The CT scan caught the bottom of the lungs which showed the patient does have pulmonary nodules.  This will have to be followed up as outpatient. 7. Weakness.  Physical therapy recommended home with home health.   Code Status:     Code Status Orders  (From admission, onward)  Start     Ordered   08/22/19 1826  Full code  Continuous     08/22/19 1825        Code Status History    Date Active Date Inactive Code Status Order ID Comments User Context   07/09/2019 1057 07/09/2019 1725 Full Code [id number]  Deboraha Sprang, MD Inpatient   08/26/2014 1001 08/26/2014 1543 Full Code [id number]  [place], Peter M, MD Inpatient   06/07/2014 1324 06/07/2014 2149 Full Code [id number]  [name], MD Inpatient   05/30/2014 0838 05/31/2014 0337 Full Code [id number]  Marybelle Killings, MD [facility]   Advance Care Planning Activity    Advance Directive Documentation     Most Recent Value  Type of Advance Directive  Healthcare Power of Attorney, Living will  Pre-existing out of facility DNR order (yellow form or pink MOST form)  -  "MOST" Form in Place?  -     Family Communication: Spoke with patient's son on the phone and left message for patient's wife Disposition Plan: Will be up to vascular surgery and how he recovers from yesterday's procedures  Consultants:  Vascular surgery  Procedures:  Angiogram lower extremities  Bilateral femoral endarterectomy and aortic graft placement  Antibiotics:  Rocephin  Time spent: 27 minutes  [place]

## 2019-08-26 NOTE — Evaluation (Signed)
Physical Therapy Re-Evaluation Patient Details Name: Howard [name] MRN: [id number] DOB: December 25, 1930 Today's Date: 08/26/2019   History of Present Illness  Pt is an 85 yo male who presented to ED for great toe pain and swelling, exam consistent with cellulitis.  During hospitalization pt noted with hemoptysis likely secondary heparin drip.  Pt s/p left common femoral artery and profunda femoris artery endarterectomy with core matrix patch angioplasty, left SFA angioplasty and stent placement, right renal artery stent placement, aortic stent placement with limb extension into the external right iliac artery, left external iliac artery stent placement 08/25/19.  New PT consult received post op.   PMH includes PAD, DM, CHF, vertigo, HTN  Clinical Impression  PT received new PT consult post op and PT re-evaluation performed.  Prior to hospital admission, pt was modified independent (didn't typically use AD in home but used loftstrand crutch to go MD appts; also used scooter in community).  Pt lives with his wife on main level of home with 3 steps with B railings to enter.  Currently pt is SBA semi-supine to sit; CGA with transfers with RW; and CGA to ambulate 5 feet from bed to recliner with RW.  Limited distance ambulating d/t pt reporting 7/10 B groin pain with standing/walking (pulling sensation on incisions) but pain decreased to 0/10 after sitting in recliner (nurse notified).  Pt would benefit from skilled PT to address noted impairments and functional limitations (see below for any additional details).  Upon hospital discharge, recommend HHPT.    Follow Up Recommendations Home health PT    Equipment Recommendations  Rolling walker with 5" wheels;3in1 (PT)    Recommendations for Other Services OT consult     Precautions / Restrictions Precautions Precautions: Fall Precaution Comments: B groin incisions Restrictions Weight Bearing Restrictions: No      Mobility  Bed Mobility Overal bed  mobility: Needs Assistance Bed Mobility: Supine to Sit     Supine to sit: Supervision;HOB elevated     General bed mobility comments: increased effort to perform on own; use of bedrails; pt rolling partway to L side in order to get up onto L side of bed  Transfers Overall transfer level: Needs assistance Equipment used: Rolling walker (2 wheeled) Transfers: Sit to/from Stand Sit to Stand: Min guard         General transfer comment: fairly strong stand with RW and fairly controlled descent sitting in recliner; pt reliant on UE support on RW for balance  Ambulation/Gait Ambulation/Gait assistance: Min guard Gait Distance (Feet): 5 Feet(bed to recliner) Assistive device: Rolling walker (2 wheeled)   Gait velocity: decreased   General Gait Details: decreased B LE step length; increased UE support through RW; limited d/t B groin pain; steady  Stairs            Wheelchair Mobility    Modified Rankin (Stroke Patients Only)       Balance Overall balance assessment: Needs assistance Sitting-balance support: No upper extremity supported;Feet supported Sitting balance-Leahy Scale: Good Sitting balance - Comments: steady sitting reaching within BOS   Standing balance support: Bilateral upper extremity supported;During functional activity Standing balance-Leahy Scale: Fair Standing balance comment: pt reliant on UE support on RW to maintain balance                             Pertinent Vitals/Pain Pain Assessment: 0-10 Pain Score: 0-No pain Pain Location: B groin incisions (0/10 at rest; 7/10 with activity)  Pain Descriptors / Indicators: Aching Pain Intervention(s): Limited activity within patient's tolerance;Monitored during session;Repositioned  Nurse place pt on room air beginning of PT session.  O2 sats 91% or greater on room air during session's activities (pt's O2 not consistently reading but when able to get good reading O2 sats 91% or greater--nurse  notified and changed sensor to get more consistent reading).  HR WFL during session's activities.    Home Living Family/patient expects to be discharged to:: Private residence Living Arrangements: Spouse/significant other Available Help at Discharge: Available 24 hours/day;Family;Available PRN/intermittently(son and daughter in law available PRN) Type of Home: House Home Access: Stairs to enter Entrance Stairs-Rails: Right;Left;Can reach both Entrance Stairs-Number of Steps: 3 Home Layout: Laundry or work area in basement;One level Home Equipment: Grab bars - tub/shower;Cane - single point;Shower seat;Other (comment);Electric scooter(loftstrand crutch)      Prior Function Level of Independence: Independent with assistive device(s)         Comments: Per PT eval "pt and wife report pt independent with ADLs, uses a shower chair, pt reports being limited household ambulator due to balance and endurance deficits, wife reports pt has had 5 back surgeries and pt is very sedentary, pt reports unable to walk on inclines and struggles with stairs due to balance deficits and uses a scooter for community ambulation in stores, wife reports having some mobility concerns as well especially her balance, wife reports cooking is a problem for them and they mostly eat out"     Hand Dominance        Extremity/Trunk Assessment   Upper Extremity Assessment Upper Extremity Assessment: Generalized weakness    Lower Extremity Assessment Lower Extremity Assessment: Generalized weakness    Cervical / Trunk Assessment Cervical / Trunk Assessment: Kyphotic  Communication   Communication: HOH;Other (comment)(has hearing aides)  Cognition Arousal/Alertness: Awake/alert Behavior During Therapy: WFL for tasks assessed/performed Overall Cognitive Status: Within Functional Limits for tasks assessed                                        General Comments General comments (skin integrity,  edema, etc.): B groin dressings intact with minimal drainage.  Nursing cleared pt for participation in physical therapy.  Pt agreeable to PT session.  Pt's wife present during session.    Exercises  Bed mobility/transfer training   Assessment/Plan    PT Assessment Patient needs continued PT services  PT Problem List         PT Treatment Interventions DME instruction;Therapeutic exercise;Gait training;Balance training;Stair training;Neuromuscular re-education;Functional mobility training;Therapeutic activities;Patient/family education    PT Goals (Current goals can be found in the Care Plan section)  Acute Rehab PT Goals Patient Stated Goal: return home and have less pain PT Goal Formulation: With patient Time For Goal Achievement: 08/30/19 Potential to Achieve Goals: Good    Frequency Min 2X/week   Barriers to discharge   Per PT evaluation "wife reports having mobility and balance deficits herself making caregiver duties more difficult"    Co-evaluation               AM-PAC PT "6 Clicks" Mobility  Outcome Measure Help needed turning from your back to your side while in a flat bed without using bedrails?: A Little Help needed moving from lying on your back to sitting on the side of a flat bed without using bedrails?: A Little Help needed moving to and from a  bed to a chair (including a wheelchair)?: A Little Help needed standing up from a chair using your arms (e.g., wheelchair or bedside chair)?: A Little Help needed to walk in hospital room?: A Little Help needed climbing 3-5 steps with a railing? : A Little 6 Click Score: 18    End of Session Equipment Utilized During Treatment: Gait belt Activity Tolerance: Patient tolerated treatment well Patient left: in chair;with call bell/phone within reach;with family/visitor present(wife present) Nurse Communication: Mobility status;Precautions;Other (comment)(nurse cleared pt to be sitting in chair without chair alarm (pt  verbalized understanding to call and wait for help prior to getting out of chair)) PT Visit Diagnosis: Other abnormalities of gait and mobility (R26.89);Muscle weakness (generalized) (M62.81);Difficulty in walking, not elsewhere classified (R26.2);Pain Pain - Right/Left: Left(Right) Pain - part of body: (groin incisions)    Time: 1100-1145 PT Time Calculation (min) (ACUTE ONLY): 45 min   Charges:   PT Evaluation $PT Re-evaluation: 1 Re-eval PT Treatments $Therapeutic Activity: 8-22 mins       Leitha Bleak, PT 08/26/19, 12:25 PM [phone]

## 2019-08-26 NOTE — Progress Notes (Signed)
[facility] Daily Progress Note   Subjective: 1 Day Post-Op: 1. Cutdown for placement of endoprosthesis right femoral artery 2. Left common femoral artery and profunda femoris artery endarterectomy with core matrix patch angioplasty 3. Catheter placement into aorta from bilateral femoral approaches 4. Catheter placement into left popliteal artery from left common femoral approach with left lower extremity angiogram 5. Placement of a 6 mm diameter by 15 cm length via bond stent left superficial femoral artery postdilated with a 5 mm diameter Lutonix drug-coated angioplasty balloon 6. Catheter placement into right renal artery from left femoral approach 7. Right renal artery stent placement with 7 mm diameter by 16 mm length lifestream stent 8. Placement of a 23 mm diameter 14 cm length Gore Excluder Endoprosthesis main body right with a 14 mm diameter by 12 cm length left contralateral limb 9. Stent placement in the right external iliac artery with 10 mm diameter by 58 mm length lifestream stent for occlusive disease 10. Gore excluder limb extension with a 12 mm diameter by 14 cm length limb to the distal right external iliac artery for occlusive disease 11. Viabahn stent placement to the left external iliac artery with 13 mm diameter by 10 cm length stent for occlusive disease  Patient without complaint.  Notes that he has improved sensation to the left foot.  No issues overnight.  Objective: Vitals:   08/26/19 0500 08/26/19 0600 08/26/19 0700 08/26/19 0757  BP: (!) 124/54 (!) 120/57 (!) 125/52   Pulse: 81 86 79   Resp: 16 19 16    Temp:    97.6 F (36.4 C)  TempSrc:    Oral  SpO2: 96% 93% 95%   Weight:      Height:        Intake/Output Summary (Last 24 hours) at 08/26/2019 1008 Last data filed at 08/26/2019 0919 Gross per 24 hour  Intake 3703 ml  Output 1540 ml  Net 2163 ml   Physical Exam: A&Ox3, NAD CV: RRR Pulmonary: CTA Bilaterally Abdomen: Soft,  Nontender, Nondistended Right Groin: Our dressing intact clean and dry.  No swelling or drainage noted. Left lower extremity: Thigh soft, calf soft.  Extremities warm distally to toes. Palpable DP pulse. Right lower extremity: Thigh soft.  Calf soft.  Palpable DP pulse. GU: Foley intact.  Draining clear urine   Laboratory: CBC    Component Value Date/Time   WBC 7.2 08/26/2019 0435   HGB 11.6 (L) 08/26/2019 0435   HGB 14.0 07/01/2019 0924   HCT 35.7 (L) 08/26/2019 0435   HCT 43.7 07/01/2019 0924   PLT 197 08/26/2019 0435   PLT 297 07/01/2019 0924   BMET    Component Value Date/Time   NA 139 08/26/2019 0435   NA 134 07/01/2019 0930   NA 137 11/10/2014 2041   K 4.8 08/26/2019 0435   K 3.9 11/10/2014 2041   CL 107 08/26/2019 0435   CL 104 11/10/2014 2041   CO2 24 08/26/2019 0435   CO2 24 11/10/2014 2041   GLUCOSE 173 (H) 08/26/2019 0435   GLUCOSE 154 (H) 11/10/2014 2041   BUN 16 08/26/2019 0435   BUN 28 (H) 07/01/2019 0930   BUN 13 11/10/2014 2041   CREATININE 0.88 08/26/2019 0435   CREATININE 0.81 09/30/2017 1003   CALCIUM 8.8 (L) 08/26/2019 0435   CALCIUM 8.5 11/10/2014 2041   GFRNONAA >60 08/26/2019 0435   GFRNONAA 82 09/30/2017 1003   GFRAA >60 08/26/2019 0435   GFRAA 95 09/30/2017 1003  Assessment/Planning: The patient is an 85 year old male status post a left common femoral artery and profunda femoris artery endarterectomy with core matrix patch angioplasty, left SFA angioplasty and stent placement, right renal artery stent placement, aortic stent placement with limb extension into the external right iliac artery, left external iliac artery stent placement - POD #1 1) no issues overnight.  Patient without complaint this a.m.  Physical exam with improved perfusion of the bilateral lower extremities. 2) One gram drop in Hbg - to be expected due to the extensive procedure the patient underwent.  Asymptomatic. 3) creatinine stable 4) we will DC Foley 5) transfer to  MedSurg 6) PT/OT 7) ASA / Plavix 8) Advance diet  Discussed with Dr. Ellis Parents Mima Cranmore PA-C 08/26/2019 10:08 AM

## 2019-08-26 NOTE — Anesthesia Postprocedure Evaluation (Signed)
Anesthesia Post Note  Patient: Howard [name]  Procedure(s) Performed: ABDOMINAL AORTIC ENDOVASCULAR STENT GRAFT INSERTION OF ILIAC STENT (Bilateral ) ENDARTERECTOMY FEMORAL (Left ) ANGIOPLASTY (Left ) ENDOVASCULAR REPAIR/STENT GRAFT (Right )  Patient location during evaluation: ICU Anesthesia Type: General Level of consciousness: awake, awake and alert and oriented Pain management: pain level controlled Vital Signs Assessment: post-procedure vital signs reviewed and stable Respiratory status: spontaneous breathing, nonlabored ventilation, respiratory function stable and patient connected to face mask oxygen Cardiovascular status: stable Postop Assessment: no apparent nausea or vomiting Anesthetic complications: no     Last Vitals:  Vitals:   08/26/19 0500 08/26/19 0600  BP: (!) 124/54 (!) 120/57  Pulse: 81 86  Resp: 16 19  Temp:    SpO2: 96% 93%    Last Pain:  Vitals:   08/26/19 0400  TempSrc: Axillary  PainSc: Asleep                 Hedda Slade

## 2019-08-27 ENCOUNTER — Encounter: Payer: Self-pay | Admitting: Vascular Surgery

## 2019-08-27 LAB — CBC
HCT: 34.7 % — ABNORMAL LOW (ref 39.0–52.0)
Hemoglobin: 11.3 g/dL — ABNORMAL LOW (ref 13.0–17.0)
MCH: 26.7 pg (ref 26.0–34.0)
MCHC: 32.6 g/dL (ref 30.0–36.0)
MCV: 82 fL (ref 80.0–100.0)
Platelets: 201 10*3/uL (ref 150–400)
RBC: 4.23 MIL/uL (ref 4.22–5.81)
RDW: 14.6 % (ref 11.5–15.5)
WBC: 9.2 10*3/uL (ref 4.0–10.5)
nRBC: 0 % (ref 0.0–0.2)

## 2019-08-27 LAB — BASIC METABOLIC PANEL
Anion gap: 11 (ref 5–15)
BUN: 20 mg/dL (ref 8–23)
CO2: 23 mmol/L (ref 22–32)
Calcium: 9 mg/dL (ref 8.9–10.3)
Chloride: 102 mmol/L (ref 98–111)
Creatinine, Ser: 1.05 mg/dL (ref 0.61–1.24)
GFR calc Af Amer: >60 mL/min (ref >60)
GFR calc non Af Amer: >60 mL/min (ref >60)
Glucose, Bld: 158 mg/dL — ABNORMAL HIGH (ref 70–99)
Potassium: 4 mmol/L (ref 3.5–5.1)
Sodium: 136 mmol/L (ref 135–145)

## 2019-08-27 LAB — GLUCOSE, CAPILLARY
Glucose-Capillary: 151 mg/dL — ABNORMAL HIGH (ref 70–99)
Glucose-Capillary: 141 mg/dL — ABNORMAL HIGH (ref 70–99)
Glucose-Capillary: 131 mg/dL — ABNORMAL HIGH (ref 70–99)
Glucose-Capillary: 138 mg/dL — ABNORMAL HIGH (ref 70–99)

## 2019-08-27 LAB — CULTURE, BLOOD (ROUTINE X 2)
Culture: NO GROWTH
Culture: NO GROWTH
Special Requests: ADEQUATE

## 2019-08-27 LAB — SURGICAL PATHOLOGY

## 2019-08-27 LAB — MAGNESIUM: Magnesium: 1.7 mg/dL (ref 1.7–2.4)

## 2019-08-27 MED ORDER — SACUBITRIL-VALSARTAN 24-26 MG PO TABS
1.0000 | ORAL_TABLET | Freq: Two times a day (BID) | ORAL | Status: DC
  Administered 2019-08-27 – 2019-08-28 (×2): 1 via ORAL
  Filled 2019-08-27 (×4): qty 1

## 2019-08-27 NOTE — Evaluation (Signed)
Occupational Therapy Evaluation Patient Details Name: Howard [name] MRN: [id number] DOB: 25-Aug-1931 Today's Date: 08/27/2019    History of Present Illness Pt is an 85 yo male who presented to ED for great toe pain and swelling, exam consistent with cellulitis.  During hospitalization pt noted with hemoptysis likely secondary heparin drip.  Pt s/p left common femoral artery and profunda femoris artery endarterectomy with core matrix patch angioplasty, left SFA angioplasty and stent placement, right renal artery stent placement, aortic stent placement with limb extension into the external right iliac artery, left external iliac artery stent placement 08/25/19.  New PT consult received post op.   PMH includes PAD, DM, CHF, vertigo, HTN   Clinical Impression   Howard [name] was seen for OT evaluation this date, POD#2 from above proceedure. Pt was independent in all ADLs prior to surgery, however occasionally using Lofstrand crutch for mobility to support balance. Pt is eager to return to PLOF with less pain and improved safety and independence. Pt currently requires moderate assist for LB dressing and bathing while in seated position due to pain and limited AROM of BLEs. Pt instructed in self care skills, falls prevention strategies, home/routines modifications, & DME/AE for LB bathing and dressing tasks. Pt would benefit from additional instruction in self care skills and techniques to help maintain precautions with or without assistive devices to support recall and carryover prior to discharge. Recommend HHOT upon discharge.      Follow Up Recommendations  Home health OT    Equipment Recommendations  3 in 1 bedside commode    Recommendations for Other Services       Precautions / Restrictions Precautions Precautions: Fall Precaution Comments: B groin incisions Restrictions Weight Bearing Restrictions: No      Mobility Bed Mobility Overal bed mobility: Needs Assistance Bed Mobility: Supine  to Sit     Supine to sit: Supervision;HOB elevated     General bed mobility comments: Pt declined mobility attempts on this date. Per physical therapy pt supervision for sup>sit.  Transfers Overall transfer level: Needs assistance Equipment used: Rolling walker (2 wheeled) Transfers: Sit to/from Stand Sit to Stand: Min guard         General transfer comment: Per PT note "fairly strong stand with RW and fairly controlled descent sitting in recliner; pt reliant on UE support on RW for balance"    Balance Overall balance assessment: Needs assistance         Standing balance support: Bilateral upper extremity supported;During functional activity                               ADL either performed or assessed with clinical judgement   ADL Overall ADL's : Needs assistance/impaired Eating/Feeding: Sitting;Set up   Grooming: Sitting;Set up   Upper Body Bathing: Sitting;Minimal assistance   Lower Body Bathing: Sitting/lateral leans;Moderate assistance   Upper Body Dressing : Sitting;Set up;Supervision/safety   Lower Body Dressing: Sit to/from stand;Moderate assistance;Set up   Toilet Transfer: BSC;Set up;RW;Moderate assistance;Stand-pivot   Toileting- Clothing Manipulation and Hygiene: Sit to/from stand;Minimal assistance;Set up               Vision Baseline Vision/History: Wears glasses Wears Glasses: Reading only Patient Visual Report: No change from baseline       Perception     Praxis      Pertinent Vitals/Pain Pain Assessment: No/denies pain Pain Location: Pt denies pain at rest. Pain Intervention(s): Limited activity within  patient's tolerance;Monitored during session     Hand Dominance Right   Extremity/Trunk Assessment Upper Extremity Assessment Upper Extremity Assessment: Generalized weakness(BUE grossly 3/5. ROM WFL, grip generally weak bilaterally. [facility] WFL. Pt reports hx of trauma to first digit on LUE, does not impact function at  this time.)   Lower Extremity Assessment Lower Extremity Assessment: Generalized weakness;Defer to PT evaluation   Cervical / Trunk Assessment Cervical / Trunk Assessment: Kyphotic   Communication Communication Communication: HOH(Has hearing aids)   Cognition Arousal/Alertness: Awake/alert Behavior During Therapy: WFL for tasks assessed/performed Overall Cognitive Status: Within Functional Limits for tasks assessed                                 General Comments: Pt declined mobility on this date secondary to fear of increased pain in BLEs   General Comments  B groin dressings intact start/end of session.    Exercises Other Exercises Other Exercises: Pt and caregiver (wife) educated in falls prevention strategies, safe use of AE for LB ADL mgt and functional mobility. Pt declined to trial. Would benefit from reinforcement.   Shoulder Instructions      Home Living Family/patient expects to be discharged to:: Private residence Living Arrangements: Spouse/significant other Available Help at Discharge: Available 24 hours/day;Family;Available PRN/intermittently(Son and DIL available PRN) Type of Home: House Home Access: Stairs to enter [employer] of Steps: 3 Entrance Stairs-Rails: Right;Left;Can reach both Home Layout: Laundry or work area in basement;One level     Bathroom Shower/Tub: Occupational psychologist: Handicapped height Bathroom Accessibility: Yes How Accessible: Accessible via walker(Believes walker may be able to fit, but it would be tight.) Home Equipment: Grab bars - tub/shower;Cane - single point;Shower seat;Other (comment);Electric scooter(Lofstrand crutch)          Prior Functioning/Environment Level of Independence: Independent with assistive device(s)        Comments: Per PT eval "pt and wife report pt independent with ADLs, uses a shower chair, pt reports being limited household ambulator due to balance and  endurance deficits, wife reports pt has had 5 back surgeries and pt is very sedentary, pt reports unable to walk on inclines and struggles with stairs due to balance deficits and uses a scooter for community ambulation in stores, wife reports having some mobility concerns as well especially her balance, wife reports cooking is a problem for them and they mostly eat out"        OT Problem List: Decreased strength;Decreased coordination;Pain;Decreased range of motion;Decreased activity tolerance;Decreased safety awareness;Decreased knowledge of use of DME or AE;Decreased knowledge of precautions;Impaired balance (sitting and/or standing)      OT Treatment/Interventions: Self-care/ADL training;Therapeutic exercise;Balance training;Therapeutic activities;DME and/or AE instruction;Patient/family education    OT Goals(Current goals can be found in the care plan section) Acute Rehab OT Goals Patient Stated Goal: return home and have less pain OT Goal Formulation: With patient Time For Goal Achievement: 09/10/19 Potential to Achieve Goals: Good ADL Goals Pt Will Perform Grooming: sitting;with modified independence;with adaptive equipment(With LRAD PRN for improved safety and functional independence.) Pt Will Perform Lower Body Bathing: with adaptive equipment;with min assist;sitting/lateral leans(With LRAD PRN for improved safety and functional independence.) Pt Will Perform Lower Body Dressing: with min assist;with adaptive equipment;sit to/from stand(With LRAD PRN for improved safety and functional independence.)  OT Frequency: Min 1X/week   Barriers to D/C:            Co-evaluation  AM-PAC OT "6 Clicks" Daily Activity     Outcome Measure Help from another person eating meals?: None Help from another person taking care of personal grooming?: A Little Help from another person toileting, which includes using toliet, bedpan, or urinal?: A Little Help from another person  bathing (including washing, rinsing, drying)?: A Lot Help from another person to put on and taking off regular upper body clothing?: A Little Help from another person to put on and taking off regular lower body clothing?: A Lot 6 Click Score: 17   End of Session    Activity Tolerance: Patient limited by pain;Other (comment)(Pt in 0/10 pain. Stated he was afraid of pain increasing with mobility. Declined activity despite education.) Patient left: in bed;with call bell/phone within reach;with family/visitor present;with bed alarm set  OT Visit Diagnosis: Other abnormalities of gait and mobility (R26.89);Muscle weakness (generalized) (M62.81)                Time: 6578-4696 OT Time Calculation (min): 23 min Charges:  OT General Charges $OT Visit: 1 Visit OT Evaluation $OT Eval Low Complexity: 1 Low OT Treatments $Self Care/Home Management : 8-22 mins  Shara Blazing, M.S., OTR/L Ascom: [phone] 08/27/19, 10:59 AM

## 2019-08-27 NOTE — Progress Notes (Signed)
[facility]  Daily Progress Note   Subjective  - 2 Days Post-Op  Patient feels well.  Some groin [name] which is stable.  No major issues or complaints overnight.  Worked some with physical therapy but his walking was limited yesterday.  Objective Vitals:   08/27/19 0100 08/27/19 0200 08/27/19 0400 08/27/19 0800  BP:  (!) 154/69 (!) 135/104 122/62  Pulse: (!) 111 100 (!) 103 91  Resp: (!) 21 (!) 22 20 20   Temp:    98.3 F (36.8 C)  TempSrc:    Oral  SpO2: 94% 98% 93% 95%  Weight:      Height:        Intake/Output Summary (Last 24 hours) at 08/27/2019 0907 Last data filed at 08/27/2019 0800 Gross per 24 hour  Intake 1624.5 ml  Output 1080 ml  Net 544.5 ml    PULM  CTAB CV  slightly tachycardic but regular VASC  feet are warm with good capillary refill.  Incisions are intact.  Laboratory CBC    Component Value Date/Time   WBC 9.2 08/27/2019 0353   HGB 11.3 (L) 08/27/2019 0353   HGB 14.0 07/01/2019 0924   HCT 34.7 (L) 08/27/2019 0353   HCT 43.7 07/01/2019 0924   PLT 201 08/27/2019 0353   PLT 297 07/01/2019 0924    BMET    Component Value Date/Time   NA 136 08/27/2019 0353   NA 134 07/01/2019 0930   NA 137 11/10/2014 2041   K 4.0 08/27/2019 0353   K 3.9 11/10/2014 2041   CL 102 08/27/2019 0353   CL 104 11/10/2014 2041   CO2 23 08/27/2019 0353   CO2 24 11/10/2014 2041   GLUCOSE 158 (H) 08/27/2019 0353   GLUCOSE 154 (H) 11/10/2014 2041   BUN 20 08/27/2019 0353   BUN 28 (H) 07/01/2019 0930   BUN 13 11/10/2014 2041   CREATININE 1.05 08/27/2019 0353   CREATININE 0.81 09/30/2017 1003   CALCIUM 9.0 08/27/2019 0353   CALCIUM 8.5 11/10/2014 2041   GFRNONAA >60 08/27/2019 0353   GFRNONAA 82 09/30/2017 1003   GFRAA >60 08/27/2019 0353   GFRAA 95 09/30/2017 1003    Assessment/Planning: POD #2 s/p extensive left lower extremity revascularization, aortic stent graft placement, bilateral iliac stent placement, and right renal stent  placement   Overall he is doing quite well  Transfer to the floor today  Feet are warm with no further [name] and his perfusion is markedly improved on both sides.  Likely will need home PT and potentially home health  Will shoot for discharge tomorrow on aspirin and Plavix    Howard [name]  08/27/2019, 9:07 AM

## 2019-08-27 NOTE — TOC Progression Note (Signed)
Transition of Care [facility]) - Progression Note    Patient Details  Name: Howard [name] MRN: [id number] Date of Birth: [birth year]  Transition of Care [facility]) CM/SW [place], LCSW Phone Number: 08/27/2019, 9:54 AM  Clinical Narrative: Per vascular note, patient will likely discharge tomorrow. [facility] representative is aware. Ordered RW and 3-in-1 through [place].    Expected Discharge Plan: [place] Barriers to Discharge: Continued Medical Work up  Expected Discharge Plan and Services Expected Discharge Plan: [place]: Durable Medical Equipment, Home Health Living arrangements for the past 2 months: Single Family Home                                       Social Determinants of Health (SDOH) Interventions    Readmission Risk Interventions No flowsheet data found.

## 2019-08-27 NOTE — Progress Notes (Signed)
Patient ID: Howard [name], male   DOB: 1931-09-03, 85 y.o.   MRN: [id number]  [facility] PROGRESS NOTE  Howard [name] DOB: [birth year] DOA: 08/22/2019 PCP: Margo Common, PA  HPI/Subjective: Patient feels okay.  Offers no complaints.  States that he has no pain in his toe.  Has some pulling in his groin.  Objective: Vitals:   08/27/19 0400 08/27/19 0800  BP: (!) 135/104 122/62  Pulse: (!) 103 91  Resp: 20 20  Temp:  98.3 F (36.8 C)  SpO2: 93% 95%    Intake/Output Summary (Last 24 hours) at 08/27/2019 1336 Last data filed at 08/27/2019 0900 Gross per 24 hour  Intake 843 ml  Output 1080 ml  Net -237 ml   Filed Weights   08/22/19 2041 08/23/19 1557 08/25/19 1800  Weight: 82.5 kg 82.5 kg 85.5 kg    ROS: Review of Systems  Constitutional: Negative for chills and fever.  Eyes: Negative for blurred vision.  Respiratory: Negative for cough, shortness of breath and wheezing.   Cardiovascular: Negative for chest pain.  Gastrointestinal: Negative for abdominal pain, constipation, diarrhea, nausea and vomiting.  Genitourinary: Negative for dysuria.  Musculoskeletal: Negative for joint pain.  Neurological: Negative for dizziness and headaches.   Exam: Physical Exam  Constitutional: He is oriented to person, place, and time.  HENT:  Nose: No mucosal edema.  Mouth/Throat: No oropharyngeal exudate or posterior oropharyngeal edema.  Eyes: Pupils are equal, round, and reactive to light. Conjunctivae, EOM and lids are normal.  Neck: No JVD present. Carotid bruit is not present. No edema present. No thyroid mass and no thyromegaly present.  Cardiovascular: S1 normal and S2 normal. Exam reveals no gallop.  No murmur heard. Pulses:      Dorsalis pedis pulses are 1+ on the right side and 1+ on the left side.  Respiratory: No respiratory distress. He has no wheezes. He has no rhonchi. He has no rales.  GI: Soft. Bowel sounds are normal. There is no abdominal  tenderness. A hernia is present. Hernia confirmed positive in the ventral area.  Musculoskeletal:     Right shoulder: He exhibits no swelling.  Lymphadenopathy:    He has no cervical adenopathy.  Neurological: He is alert and oriented to person, place, and time. No cranial nerve deficit.  Skin: Skin is warm. Nails show no clubbing.  Left first toe only slight redness.  Much better than on admission.  Psychiatric: He has a normal mood and affect.    Data Reviewed: Basic Metabolic Panel: Recent Labs  Lab 08/23/19 0555 08/24/19 0301 08/25/19 0319 08/26/19 0435 08/27/19 0353  NA 137 134* 136 139 136  K 3.9 3.9 3.8 4.8 4.0  CL 104 104 104 107 102  CO2 24 21* 21* 24 23  GLUCOSE 132* 130* 133* 173* 158*  BUN 20 22 18 16 20   CREATININE 0.92 0.99 0.85 0.88 1.05  CALCIUM 9.0 8.7* 8.9 8.8* 9.0  MG  --   --  1.7  --  1.7   Liver Function Tests: Recent Labs  Lab 08/21/19 1310  AST 21  ALT 18  ALKPHOS 48  BILITOT 0.4  PROT 7.6  ALBUMIN 4.4   CBC: Recent Labs  Lab 08/21/19 1310 08/22/19 1405 08/23/19 0555 08/24/19 0301 08/25/19 0319 08/26/19 0435 08/27/19 0353  WBC 6.3 6.2 4.9 5.2 6.2 7.2 9.2  NEUTROABS 2.9 2.8  --   --   --   --   --   HGB 13.7 13.6  13.2 12.3* 12.6* 11.6* 11.3*  HCT 42.0 41.0 39.9 37.1* 37.8* 35.7* 34.7*  MCV 82.4 81.7 81.1 81.5 80.8 82.6 82.0  PLT 265 272 228 215 228 197 201    CBG: Recent Labs  Lab 08/26/19 1132 08/26/19 1547 08/26/19 2136 08/27/19 0746 08/27/19 1138  GLUCAP 155* 142* 128* 151* 141*    Recent Results (from the past 240 hour(s))  SARS Coronavirus 2 [facility] order, Performed in [facility] lab) Nasopharyngeal Nasopharyngeal Swab     Status: None   Collection Time: 08/22/19  6:35 PM   Specimen: Nasopharyngeal Swab  Result Value Ref Range Status   SARS Coronavirus 2 NEGATIVE NEGATIVE Final    Comment: (NOTE) If result is NEGATIVE SARS-CoV-2 target nucleic acids are NOT DETECTED. The SARS-CoV-2 RNA is generally  detectable in upper and lower  respiratory specimens during the acute phase of infection. The lowest  concentration of SARS-CoV-2 viral copies this assay can detect is 250  copies / mL. A negative result does not preclude SARS-CoV-2 infection  and should not be used as the sole basis for treatment or other  patient management decisions.  A negative result may occur with  improper specimen collection / handling, submission of specimen other  than nasopharyngeal swab, presence of viral mutation(s) within the  areas targeted by this assay, and inadequate number of viral copies  (<250 copies / mL). A negative result must be combined with clinical  observations, patient history, and epidemiological information. If result is POSITIVE SARS-CoV-2 target nucleic acids are DETECTED. The SARS-CoV-2 RNA is generally detectable in upper and lower  respiratory specimens dur ing the acute phase of infection.  Positive  results are indicative of active infection with SARS-CoV-2.  Clinical  correlation with patient history and other diagnostic information is  necessary to determine patient infection status.  Positive results do  not rule out bacterial infection or co-infection with other viruses. If result is PRESUMPTIVE POSTIVE SARS-CoV-2 nucleic acids MAY BE PRESENT.   A presumptive positive result was obtained on the submitted specimen  and confirmed on repeat testing.  While 2019 novel coronavirus  (SARS-CoV-2) nucleic acids may be present in the submitted sample  additional confirmatory testing may be necessary for epidemiological  and / or clinical management purposes  to differentiate between  SARS-CoV-2 and other Sarbecovirus currently known to infect humans.  If clinically indicated additional testing with an alternate test  methodology [phone]) is advised. The SARS-CoV-2 RNA is generally  detectable in upper and lower respiratory sp ecimens during the acute  phase of infection. The  expected result is Negative. Fact Sheet for Patients:  StrictlyIdeas.no Fact Sheet for Healthcare Providers: BankingDealers.co.za This test is not yet approved or cleared by the [place] FDA and has been authorized for detection and/or diagnosis of SARS-CoV-2 by FDA under an Emergency Use Authorization (EUA).  This EUA will remain in effect (meaning this test can be used) for the duration of the COVID-19 declaration under Section 564(b)(1) of the Act, 21 U.S.C. section 360bbb-3(b)(1), unless the authorization is terminated or revoked sooner. Performed at [facility], [address] [place], [place] [address]   Culture, blood (routine x 2)     Status: None   Collection Time: 08/22/19  7:52 PM   Specimen: BLOOD  Result Value Ref Range Status   Specimen Description BLOOD RIGHT FOREARM  Final   Special Requests   Final    BOTTLES DRAWN AEROBIC AND ANAEROBIC Blood Culture adequate volume  Culture   Final    NO GROWTH 5 DAYS Performed at [facility], [address] [place], [place] [address]    Report Status 08/27/2019 FINAL  Final  Culture, blood (routine x 2)     Status: None   Collection Time: 08/22/19  7:52 PM   Specimen: BLOOD  Result Value Ref Range Status   Specimen Description BLOOD LEFT ANTECUBITAL  Final   Special Requests   Final    BOTTLES DRAWN AEROBIC AND ANAEROBIC Blood Culture results may not be optimal due to an excessive volume of blood received in culture bottles   Culture   Final    NO GROWTH 5 DAYS Performed at [facility], [address] [place], [place] [address]    Report Status 08/27/2019 FINAL  Final  MRSA PCR Screening     Status: None   Collection Time: 08/25/19 10:13 PM   Specimen: Nasopharyngeal  Result Value Ref Range Status   MRSA by PCR NEGATIVE NEGATIVE Final    Comment:        The GeneXpert MRSA Assay (FDA approved for NASAL specimens only), is one  component of a comprehensive MRSA colonization surveillance program. It is not intended to diagnose MRSA infection nor to guide or monitor treatment for MRSA infections. Performed at [facility], [address] [place], [place] [address]       Scheduled Meds: . aspirin EC  81 mg Oral Daily  . Chlorhexidine Gluconate Cloth  6 each Topical Daily  . chlorthalidone  25 mg Oral Daily  . cholecalciferol  1,000 Units Oral Daily  . clopidogrel  75 mg Oral Daily  . fentaNYL  1 patch Transdermal Q72H  . fluticasone  2 spray Each Nare Daily  . insulin aspart  0-15 Units Subcutaneous TID WC  . insulin aspart  0-5 Units Subcutaneous QHS  . isosorbide mononitrate  30 mg Oral Daily  . metoprolol succinate  25 mg Oral QHS  . multivitamin with minerals  1 tablet Oral Daily  . pantoprazole  40 mg Oral Daily  . simvastatin  40 mg Oral QHS  . sodium chloride flush  3 mL Intravenous Q12H  . spironolactone  50 mg Oral q morning - 10a   Continuous Infusions: . sodium chloride Stopped (08/24/19 1547)  . cefTRIAXone (ROCEPHIN)  IV 2 g (08/26/19 1509)    Assessment/Plan:  1. Ischemic toe versus cellulitis.  Patient status post bilateral femoral endarterectomy and aortic stent graft placement.  No further pain in the toe with better blood supply down on the legs.  Continue antibiotics for now. 2. Peripheral vascular disease.  Patient status post bilateral femoral endarterectomy and aortic stent graft placement.  Patient started on aspirin and Plavix 3. Hypertension.  Restarted Toprol.  Restart lower dose Entresto this evening 4. Type 2 diabetes mellitus.  Hold Glucophage with angiogram.  Sliding scale coverage. 5. Chronic systolic congestive heart failure.  No signs of heart failure currently.  Patient lying flat.  Restart lower dose Entresto this evening.  Restarted Toprol last night. 6. Hemoptysis likely secondary to heparin drip.  No further hemoptysis. 7. Pulmonary nodules.  This  would have to be followed up as outpatient 8. Weakness.  Physical therapy recommended home with home health.   Code Status:     Code Status Orders  (From admission, onward)         Start     Ordered   08/22/19 1826  Full code  Continuous  08/22/19 1825        Code Status History    Date Active Date Inactive Code Status Order ID Comments User Context   07/09/2019 1057 07/09/2019 1725 Full Code [id number]  Deboraha Sprang, MD Inpatient   08/26/2014 1001 08/26/2014 1543 Full Code [id number]  [place], Peter M, MD Inpatient   06/07/2014 1324 06/07/2014 2149 Full Code [id number]  [name], MD Inpatient   05/30/2014 0838 05/31/2014 0337 Full Code [id number]  Marybelle Killings, MD [facility]   Advance Care Planning Activity    Advance Directive Documentation     Most Recent Value  Type of Advance Directive  Healthcare Power of Attorney, Living will  Pre-existing out of facility DNR order (yellow form or pink MOST form)  -  "MOST" Form in Place?  -     Family Communication: Spoke with wife at the bedside Disposition Plan: Likely discharge tomorrow  Consultants:  Vascular surgery  Procedures:  Angiogram lower extremities  Bilateral femoral endarterectomy and aortic graft placement  Antibiotics:  Rocephin  Time spent: 28 minutes  [place]

## 2019-08-28 LAB — GLUCOSE, CAPILLARY: Glucose-Capillary: 126 mg/dL — ABNORMAL HIGH (ref 70–99)

## 2019-08-28 MED ORDER — CLOPIDOGREL BISULFATE 75 MG PO TABS: 75.0000 mg | ORAL_TABLET | Freq: Every day | ORAL | 0 refills | Status: DC

## 2019-08-28 MED ORDER — SACUBITRIL-VALSARTAN 24-26 MG PO TABS: 1.0000 | ORAL_TABLET | Freq: Two times a day (BID) | ORAL | 0 refills | Status: DC

## 2019-08-28 MED ORDER — AMOXICILLIN-POT CLAVULANATE 875-125 MG PO TABS: 1.0000 | ORAL_TABLET | Freq: Two times a day (BID) | ORAL | 0 refills | Status: DC

## 2019-08-28 NOTE — TOC Transition Note (Signed)
Transition of Care [facility]) - CM/SW Discharge Note   Patient Details  Name: Howard [name] MRN: [id number] Date of Birth: 22-May-1931  Transition of Care [facility]) CM/SW Contact:  Latanya Maudlin, RN Phone Number: 08/28/2019, 10:10 AM   Clinical Narrative:  Patient to be discharged per MD order. Orders in place for home health services. Patient has been set up with [facility]. Notified Sarah with [facility] of discharge. Orders for bedside commode and rolling walker in place. Delivered to bedside via [employer]. Family to transport.      Final next level of care: Home w Home Health Services Barriers to Discharge: No Barriers Identified   Patient Goals and CMS Choice   CMS Medicare.gov Compare Post Acute Care list provided to:: Patient Choice offered to / list presented to : Patient  Discharge Placement                       Discharge Plan and Services     Post Acute Care Choice: Durable Medical Equipment, Home Health          DME Arranged: Walker rolling, 3-N-1 DME Agency: [employer] Date DME Agency Contacted: 08/28/19 Time DME Agency Contacted: 51 Representative spoke with at DME Agency: [place]: RN, PT, Nurse's Aide [place] Agency: [place] Date [place]: 08/28/19 Time [place]: 1010 Representative spoke with at [place]: [place] ([place]) Interventions     Readmission Risk Interventions Readmission Risk Prevention Plan 08/28/2019  Transportation Screening Complete  PCP or Specialist Appt within 5-7 Days Complete  Home Care Screening Complete  Medication Review (RN CM) Complete  Some recent data might be hidden

## 2019-08-28 NOTE — Progress Notes (Signed)
Discharge and home health education was provided to patient and his wife. Both expressed understanding of plan of care. Ivs were removed. Patient is ready to go home.

## 2019-08-28 NOTE — Progress Notes (Signed)
   Daily Progress Note   Assessment/Planning:   POD #3 s/p B fem EA, EVAR, PTA+S R renal artery, L SFA PTA+S, TE L SFA and PFA   Pt feeling better in both feet  Incisions look good  F/U Dr. Murlean Iba in 2 weeks   Subjective  - 3 Days Post-Op   Legs feel much better   Objective   Vitals:   08/27/19 1950 08/27/19 2000 08/27/19 2200 08/28/19 0440  BP: (!) 115/59 110/60 113/62 (!) 109/51  Pulse: 97 98  87  Resp:    20  Temp:  99.3 F (37.4 C)  98.2 F (36.8 C)  TempSrc:  Oral  Oral  SpO2:  92%  93%  Weight:      Height:         Intake/Output Summary (Last 24 hours) at 08/28/2019 1041 Last data filed at 08/28/2019 1000 Gross per 24 hour  Intake 3 ml  Output 400 ml  Net -397 ml    PULM  CTAB  CV  RRR  GI  soft, NTND  VASC B groin inc c/d/i, B feet warm and viable  NEURO Motor and sens intact in feet    Laboratory   CBC CBC Latest Ref Rng & Units 08/27/2019 08/26/2019 08/25/2019  WBC 4.0 - 10.5 K/uL 9.2 7.2 6.2  Hemoglobin 13.0 - 17.0 g/dL 11.3(L) 11.6(L) 12.6(L)  Hematocrit 39.0 - 52.0 % 34.7(L) 35.7(L) 37.8(L)  Platelets 150 - 400 K/uL 201 197 228    BMET    Component Value Date/Time   NA 136 08/27/2019 0353   NA 134 07/01/2019 0930   NA 137 11/10/2014 2041   K 4.0 08/27/2019 0353   K 3.9 11/10/2014 2041   CL 102 08/27/2019 0353   CL 104 11/10/2014 2041   CO2 23 08/27/2019 0353   CO2 24 11/10/2014 2041   GLUCOSE 158 (H) 08/27/2019 0353   GLUCOSE 154 (H) 11/10/2014 2041   BUN 20 08/27/2019 0353   BUN 28 (H) 07/01/2019 0930   BUN 13 11/10/2014 2041   CREATININE 1.05 08/27/2019 0353   CREATININE 0.81 09/30/2017 1003   CALCIUM 9.0 08/27/2019 0353   CALCIUM 8.5 11/10/2014 2041   GFRNONAA >60 08/27/2019 0353   GFRNONAA 82 09/30/2017 1003   GFRAA >60 08/27/2019 0353   GFRAA 95 09/30/2017 1003     Adele Barthel, MD, FACS [facility] Office: [phone] Pager: [phone]  08/28/2019, 10:41 AM

## 2019-08-28 NOTE — Discharge Instructions (Signed)
Endovascular Therapy for Peripheral Arterial Disease, Care After This sheet gives you information about how to care for yourself after your procedure. Your health care provider may also give you more specific instructions. If you have problems or questions, contact your health care provider. What can I expect after the procedure? After the procedure, it is common to have:  Pain.  Soreness and bruising around your puncture or incision (access site).  Fatigue. Follow these instructions at home: Access site care  Follow instructions from your health care provider about how to take care of your access site. Make sure you: ? Wash your hands with soap and water before you change your bandage (dressing). If soap and water are not available, use hand sanitizer. ? Change your dressing as told by your health care provider. ? Leave stitches (sutures), skin glue, or adhesive strips in place. If adhesive strip edges start to loosen and curl up, you may trim the loose edges. Do not remove adhesive strips or skin glue completely unless your health care provider tells you to do that.  Check your access site every day for signs of infection. Check for: ? Redness, swelling, or pain. ? A lump or bump. ? Fluid or blood. ? Warmth. ? Pus or a bad smell. Medicines  Take over-the-counter and prescription medicines only as told by your health care provider. You may need to take medicines to prevent blood clots and to lower your cholesterol.  If you were prescribed antibiotic medicine, take it as told by your health care provider. Do not stop taking the antibiotic even if you start to feel better. Driving  Do not drive until your health care provider approves. You should: ? Not drive for 24 hours if you were given a medicine to help you relax (sedative) during your procedure. ? Not drive or use heavy machinery while taking prescription pain medicine. Activity  Do not lift anything that is heavier than 10  lb (4.5 kg) until your health care provider says that it is safe. You may have this lifting limit for several days.  Return to your normal activities as told by your health care provider. Ask your health care provider what activities are safe for you. ? Avoid activity that requires a lot of energy, such as exercise and sports, as told by your health care provider. ? Avoid sexual activity until your health care provider says it is safe.  Follow your exercise plan as told by your health care provider. Eating and drinking   Drink fluids as instructed to help wash (flush) dye used during the procedure out of your body.  Follow instructions from your health care provider about eating or drinking restrictions. You may need to eat a diet that is low in salt (sodium) and fat.  Avoid drinking alcohol. General instructions  Do not take baths, swim, or use a hot tub until your health care provider approves. You may take showers.  Do not use any products that contain nicotine or tobacco, such as cigarettes and e-cigarettes. If you need help quitting, ask your health care provider.  Keep all follow-up visits as told by your health care provider. This is important. Contact a health care provider if:  You have a fever.  You have severe pain that does not get better with medicine.  You have redness, swelling, or pain around your access site.  You have a fever.  You have a lump or bump at your access site. Get help right away if:  You have fluid or blood coming from your access site. If this happens, lie down on your back and apply pressure to the area.  You have chest pain.  You have problems breathing.  You have pain, numbness, or tingling in your legs.  You faint.  You have any symptoms of a stroke. "BE FAST" is an easy way to remember the main warning signs of a stroke: ? B - Balance. Signs are dizziness, sudden trouble walking, or loss of balance. ? E - Eyes. Signs are trouble  seeing or a sudden change in vision. ? F - Face. Signs are sudden weakness or numbness of the face, or the face or eyelid drooping on one side. ? A - Arms. Signs are weakness or numbness in an arm. This happens suddenly and usually on one side of the body. ? S - Speech. Signs are sudden trouble speaking, slurred speech, or trouble understanding what people say. ? T - Time. Time to call emergency services. Write down what time symptoms started.  You have other signs of a stroke, such as: ? A sudden, severe headache with no known cause. ? Nausea or vomiting. ? Seizure. These symptoms may represent a serious problem that is an emergency. Do not wait to see if the symptoms will go away. Get medical help right away. Call your local emergency services (911 in the [place] Do not drive yourself to the hospital. Summary  After the procedure, it is common to have pain and soreness near your puncture or incision (access site).  Check your access site every day for signs of infection, such as redness, swelling, or pain.  You may need to take medicines to prevent blood clots and to lower your cholesterol.  If you have any signs of a stroke, get help right away. This information is not intended to replace advice given to you by your health care provider. Make sure you discuss any questions you have with your health care provider. Document Released: 04/02/2017 Document Revised: 12/04/2018 Document Reviewed: 04/02/2017 Elsevier Patient Education  [place].   Cellulitis, Adult  Cellulitis is a skin infection. The infected area is often warm, red, swollen, and sore. It occurs most often in the arms and lower legs. It is very important to get treated for this condition. What are the causes? This condition is caused by bacteria. The bacteria enter through a break in the skin, such as a cut, burn, insect bite, open sore, or crack. What increases the risk? This condition is more likely to occur in  people who:  Have a weak body defense system (immune system).  Have open cuts, burns, bites, or scrapes on the skin.  Are older than 83 years of age.  Have a blood sugar problem (diabetes).  Have a long-lasting (chronic) liver disease (cirrhosis) or kidney disease.  Are very overweight (obese).  Have a skin problem, such as: ? Itchy rash (eczema). ? Slow movement of blood in the veins (venous stasis). ? Fluid buildup below the skin (edema).  Have been treated with high-energy rays (radiation).  Use IV drugs. What are the signs or symptoms? Symptoms of this condition include:  Skin that is: ? Red. ? Streaking. ? Spotting. ? Swollen. ? Sore or painful when you touch it. ? Warm.  A fever.  Chills.  Blisters. How is this diagnosed? This condition is diagnosed based on:  Medical history.  Physical exam.  Blood tests.  Imaging tests. How is this treated? Treatment for this  condition may include:  Medicines to treat infections or allergies.  Home care, such as: ? Rest. ? Placing cold or warm cloths (compresses) on the skin.  Hospital care, if the condition is very bad. Follow these instructions at home: Medicines  Take over-the-counter and prescription medicines only as told by your doctor.  If you were prescribed an antibiotic medicine, take it as told by your doctor. Do not stop taking it even if you start to feel better. General instructions   Drink enough fluid to keep your pee (urine) pale yellow.  Do not touch or rub the infected area.  Raise (elevate) the infected area above the level of your heart while you are sitting or lying down.  Place cold or warm cloths on the area as told by your doctor.  Keep all follow-up visits as told by your doctor. This is important. Contact a doctor if:  You have a fever.  You do not start to get better after 1-2 days of treatment.  Your bone or joint under the infected area starts to hurt after the  skin has healed.  Your infection comes back. This can happen in the same area or another area.  You have a swollen bump in the area.  You have new symptoms.  You feel ill and have muscle aches and pains. Get help right away if:  Your symptoms get worse.  You feel very sleepy.  You throw up (vomit) or have watery poop (diarrhea) for a long time.  You see red streaks coming from the area.  Your red area gets larger.  Your red area turns dark in color. These symptoms may represent a serious problem that is an emergency. Do not wait to see if the symptoms will go away. Get medical help right away. Call your local emergency services (911 in the [place] Do not drive yourself to the hospital. Summary  Cellulitis is a skin infection. The area is often warm, red, swollen, and sore.  This condition is treated with medicines, rest, and cold and warm cloths.  Take all medicines only as told by your doctor.  Tell your doctor if symptoms do not start to get better after 1-2 days of treatment. This information is not intended to replace advice given to you by your health care provider. Make sure you discuss any questions you have with your health care provider. Document Released: 05/27/2008 Document Revised: 04/30/2018 Document Reviewed: 04/30/2018 Elsevier Patient Education  2020 [employer].

## 2019-08-28 NOTE — Discharge Summary (Signed)
[facility] NAME: Howard [name]    MR#:  [id number]  DATE OF BIRTH:  1931/08/25  DATE OF ADMISSION:  08/22/2019 ADMITTING PHYSICIAN: Vaughan Basta, MD  DATE OF DISCHARGE: 08/28/2019  PRIMARY CARE PHYSICIAN: Chrismon, [name], PA    ADMISSION DIAGNOSIS:  Cellulitis of toe of left foot [L03.032] Peripheral artery disease ([place]) [I73.9]  DISCHARGE DIAGNOSIS:  Ischemic left first toe Cellulitis left first toe  SECONDARY DIAGNOSIS:   Past Medical History:  Diagnosis Date  . Arthritis   . Bell palsy   . Cancer [facility])    prostate and skin  . Chronic combined systolic and diastolic CHF, NYHA class 1 ([place])    a. 07/2014 Echo: EF 35-40%, Gr 1 DD.  Marland Kitchen Complete heart block ([place])    a. 11/2010 s/p SJM 2210 [facility] DC PPM, ser# [id number].  . Depression   . Diabetes mellitus without complication ([place])   . Fall 11-10-14  . GERD (gastroesophageal reflux disease)   . History of prostate cancer   . Hyperlipidemia   . Hypertension   . LBBB (left bundle branch block)   . Left-sided Bell's palsy   . Lung cancer ([place]) 2016  . NICM (nonischemic cardiomyopathy) ([place])    a. 07/2014 Echo: EF 35-40%, mid-apicalanteroseptal DK, Gr 1 DD, mild-mod dil LA.  . Non-obstructive CAD    a. 07/2014 Abnl MV;  b. 08/2014 Cath: LM nl, LAD 30p, RI 40p, LCX nl, OM1 40, RCA dominant 30p, 70d-->Med Rx.  Marland Kitchen Poor balance   . Presence of permanent cardiac pacemaker   . Sleep apnea    a. cpap  . Vertigo   . WPW (Wolff-Parkinson-White syndrome)    a. S/P RFCA 1991.    HOSPITAL COURSE:   1.  Ischemic left first toe.  Severe peripheral vascular disease.  The patient had angiogram and angioplasty.  Then had a CT angiogram.  Vascular surgery did a bilateral iliac stent placement and aortic stent graft placement and right renal stent placement.  The patient had better blood supply in his lower extremities and his pain in the left first toe disappeared.  The patient was  on IV heparin initially and then switched over to aspirin and Plavix after the procedures. 2.  Cellulitis of the left first toe.  The patient did have a cut and erythema of the toe this settled down with better blood supply and antibiotics.  I will finish up a few more days of antibiotics. 3.  Hypertension.  The patient did have hypotension during the hospital course and all of his meds had to be held.  I restarted low-dose Toprol and a lower dose Entresto.  Continue to monitor blood pressure as outpatient. 4.  Type 2 diabetes mellitus.  Can go back on Glucophage as outpatient 5.  Chronic systolic congestive heart failure.  Last ejection fraction on echocardiogram in computer was normal range.  Lower dose Entresto started.  Restarted Toprol.  Already on spironolactone.  No signs of congestive heart failure even though we gave IV fluids during the hospital course. 6.  Pulmonary nodules.  This will have to be followed up as outpatient. 7.  Hemoptysis likely secondary to heparin drip.  No further hemoptysis while on aspirin and Plavix. 8.  Weakness.  Physical therapy recommended home with home health which was set up.   DISCHARGE CONDITIONS:   Satisfactory  CONSULTS OBTAINED:  Vascular surgery  DRUG ALLERGIES:   Allergies  Allergen Reactions  .  Sulfa Antibiotics Rash    DISCHARGE MEDICATIONS:   Allergies as of 08/28/2019      Reactions   Sulfa Antibiotics Rash      Medication List    STOP taking these medications   chlorthalidone 25 MG tablet Commonly known as: HYGROTON   Entresto 97-103 MG Generic drug: sacubitril-valsartan Replaced by: sacubitril-valsartan 24-26 MG     TAKE these medications   amoxicillin-clavulanate 875-125 MG tablet Commonly known as: Augmentin Take 1 tablet by mouth 2 (two) times daily for 6 doses.   aspirin EC 81 MG tablet Take 81 mg by mouth daily.   clopidogrel 75 MG tablet Commonly known as: PLAVIX Take 1 tablet (75 mg total) by mouth  daily. Start taking on: August 29, 2019   fentaNYL 12 MCG/HR Commonly known as: [place] 1 patch onto the skin every 3 (three) days.   fluticasone 50 MCG/ACT nasal spray Commonly known as: FLONASE Place 2 sprays into both nostrils daily.   isosorbide mononitrate 30 MG 24 hr tablet Commonly known as: IMDUR TAKE 1 TABLET BY MOUTH DAILY   metFORMIN 500 MG tablet Commonly known as: GLUCOPHAGE Take 1 tablet (500 mg total) by mouth 2 (two) times daily with a meal.   metoprolol succinate 25 MG 24 hr tablet Commonly known as: TOPROL-XL Take 1 tablet (25 mg) by mouth once daily at bedtime What changed:   how much to take  how to take this  when to take this  additional instructions   montelukast 10 MG tablet Commonly known as: SINGULAIR Take 10 mg by mouth daily as needed (sneezing/allergies.).   multivitamin with minerals Tabs tablet Take 1 tablet by mouth daily. One-A-Day Multivitamin   mupirocin ointment 2 % Commonly known as: BACTROBAN Apply to affected area 3 times daily   omeprazole 40 MG capsule Commonly known as: PRILOSEC Take 1 capsule (40 mg total) by mouth 2 (two) times daily as needed. What changed: when to take this   sacubitril-valsartan 24-26 MG Commonly known as: ENTRESTO Take 1 tablet by mouth 2 (two) times daily. Replaces: Entresto 97-103 MG   simvastatin 40 MG tablet Commonly known as: ZOCOR TAKE 1 TABLET AT BEDTIME   spironolactone 50 MG tablet Commonly known as: ALDACTONE Take 1 tablet (50 mg) by mouth once daily in the morning What changed:   how much to take  how to take this  when to take this  additional instructions   Vitamin D3 25 MCG (1000 UT) Caps Take 1,000 Units by mouth daily.            Durable Medical Equipment  (From admission, onward)         Start     Ordered   08/27/19 0954  For home use only DME 3 n 1  Once     08/27/19 0953   08/27/19 0953  For home use only DME Walker rolling  Once     Question:  Patient needs a walker to treat with the following condition  Answer:  PVD (peripheral vascular disease) ([place])   08/27/19 0953           DISCHARGE INSTRUCTIONS:   Follow-up PMD 5 days Follow-up vascular surgery around 1 week  If you experience worsening of your admission symptoms, develop shortness of breath, life threatening emergency, suicidal or homicidal thoughts you must seek medical attention immediately by calling 911 or calling your MD immediately  if symptoms less severe.  You Must read complete instructions/literature along with all the  possible adverse reactions/side effects for all the Medicines you take and that have been prescribed to you. Take any new Medicines after you have completely understood and accept all the possible adverse reactions/side effects.   Please note  You were cared for by a hospitalist during your hospital stay. If you have any questions about your discharge medications or the care you received while you were in the hospital after you are discharged, you can call the unit and asked to speak with the hospitalist on call if the hospitalist that took care of you is not available. Once you are discharged, your primary care physician will handle any further medical issues. Please note that NO REFILLS for any discharge medications will be authorized once you are discharged, as it is imperative that you return to your primary care physician (or establish a relationship with a primary care physician if you do not have one) for your aftercare needs so that they can reassess your need for medications and monitor your lab values.    Today   CHIEF COMPLAINT:   Chief Complaint  Patient presents with  . Cellulitis    HISTORY OF PRESENT ILLNESS:  Howard [name]  is a 85 y.o. male presented with left first toe pain and redness   VITAL SIGNS:  Blood pressure (!) 109/51, pulse 87, temperature 98.2 F (36.8 C), temperature source Oral, resp. rate 20,  height 5\' 9"  (1.753 m), weight 85.5 kg, SpO2 93 %.   PHYSICAL EXAMINATION:  GENERAL:  85 y.o.-year-old patient lying in the bed with no acute distress.  EYES: Pupils equal, round, reactive to light and accommodation. No scleral icterus. Extraocular muscles intact.  HEENT: Head atraumatic, normocephalic. Oropharynx and nasopharynx clear.  NECK:  Supple, no jugular venous distention. No thyroid enlargement, no tenderness.  LUNGS: Normal breath sounds bilaterally, no wheezing, rales,rhonchi or crepitation. No use of accessory muscles of respiration.  CARDIOVASCULAR: S1, S2 normal. No murmurs, rubs, or gallops.  ABDOMEN: Soft, non-tender, non-distended. Bowel sounds present. No organomegaly or mass.  EXTREMITIES: No pedal edema, cyanosis, or clubbing.  NEUROLOGIC: Cranial nerves II through XII are intact. Muscle strength 5/5 in all extremities. Sensation intact. Gait not checked.  PSYCHIATRIC: The patient is alert and oriented x 3.  SKIN: Redness on the left first toe has faded.  No pain to palpation  DATA REVIEW:   CBC Recent Labs  Lab 08/27/19 0353  WBC 9.2  HGB 11.3*  HCT 34.7*  PLT 201    Chemistries  Recent Labs  Lab 08/27/19 0353  NA 136  K 4.0  CL 102  CO2 23  GLUCOSE 158*  BUN 20  CREATININE 1.05  CALCIUM 9.0  MG 1.7    Microbiology Results  Results for orders placed or performed during the hospital encounter of 08/22/19  SARS Coronavirus 2 [facility] order, Performed in [facility] hospital lab) Nasopharyngeal Nasopharyngeal Swab     Status: None   Collection Time: 08/22/19  6:35 PM   Specimen: Nasopharyngeal Swab  Result Value Ref Range Status   SARS Coronavirus 2 NEGATIVE NEGATIVE Final    Comment: (NOTE) If result is NEGATIVE SARS-CoV-2 target nucleic acids are NOT DETECTED. The SARS-CoV-2 RNA is generally detectable in upper and lower  respiratory specimens during the acute phase of infection. The lowest  concentration of SARS-CoV-2 viral copies this  assay can detect is 250  copies / mL. A negative result does not preclude SARS-CoV-2 infection  and should not be used as the sole basis  for treatment or other  patient management decisions.  A negative result may occur with  improper specimen collection / handling, submission of specimen other  than nasopharyngeal swab, presence of viral mutation(s) within the  areas targeted by this assay, and inadequate number of viral copies  (<250 copies / mL). A negative result must be combined with clinical  observations, patient history, and epidemiological information. If result is POSITIVE SARS-CoV-2 target nucleic acids are DETECTED. The SARS-CoV-2 RNA is generally detectable in upper and lower  respiratory specimens dur ing the acute phase of infection.  Positive  results are indicative of active infection with SARS-CoV-2.  Clinical  correlation with patient history and other diagnostic information is  necessary to determine patient infection status.  Positive results do  not rule out bacterial infection or co-infection with other viruses. If result is PRESUMPTIVE POSTIVE SARS-CoV-2 nucleic acids MAY BE PRESENT.   A presumptive positive result was obtained on the submitted specimen  and confirmed on repeat testing.  While 2019 novel coronavirus  (SARS-CoV-2) nucleic acids may be present in the submitted sample  additional confirmatory testing may be necessary for epidemiological  and / or clinical management purposes  to differentiate between  SARS-CoV-2 and other Sarbecovirus currently known to infect humans.  If clinically indicated additional testing with an alternate test  methodology [phone]) is advised. The SARS-CoV-2 RNA is generally  detectable in upper and lower respiratory sp ecimens during the acute  phase of infection. The expected result is Negative. Fact Sheet for Patients:  StrictlyIdeas.no Fact Sheet for Healthcare  Providers: BankingDealers.co.za This test is not yet approved or cleared by the [place] FDA and has been authorized for detection and/or diagnosis of SARS-CoV-2 by FDA under an Emergency Use Authorization (EUA).  This EUA will remain in effect (meaning this test can be used) for the duration of the COVID-19 declaration under Section 564(b)(1) of the Act, 21 U.S.C. section 360bbb-3(b)(1), unless the authorization is terminated or revoked sooner. Performed at [facility], [address] [place], [place] [address]   Culture, blood (routine x 2)     Status: None   Collection Time: 08/22/19  7:52 PM   Specimen: BLOOD  Result Value Ref Range Status   Specimen Description BLOOD RIGHT FOREARM  Final   Special Requests   Final    BOTTLES DRAWN AEROBIC AND ANAEROBIC Blood Culture adequate volume   Culture   Final    NO GROWTH 5 DAYS Performed at [facility], [address] [place], [place] [address]    Report Status 08/27/2019 FINAL  Final  Culture, blood (routine x 2)     Status: None   Collection Time: 08/22/19  7:52 PM   Specimen: BLOOD  Result Value Ref Range Status   Specimen Description BLOOD LEFT ANTECUBITAL  Final   Special Requests   Final    BOTTLES DRAWN AEROBIC AND ANAEROBIC Blood Culture results may not be optimal due to an excessive volume of blood received in culture bottles   Culture   Final    NO GROWTH 5 DAYS Performed at [facility], [address] [place], [place] [address]    Report Status 08/27/2019 FINAL  Final  MRSA PCR Screening     Status: None   Collection Time: 08/25/19 10:13 PM   Specimen: Nasopharyngeal  Result Value Ref Range Status   MRSA by PCR NEGATIVE NEGATIVE Final    Comment:        The GeneXpert MRSA  Assay (FDA approved for NASAL specimens only), is one component of a comprehensive MRSA colonization surveillance program. It is not intended to diagnose MRSA infection nor to guide  or monitor treatment for MRSA infections. Performed at [facility], [address], [place], [place] [address]     Management plans discussed with the patient, family (son) and they are in agreement.  CODE STATUS:     Code Status Orders  (From admission, onward)         Start     Ordered   08/22/19 1826  Full code  Continuous     08/22/19 1825        Code Status History    Date Active Date Inactive Code Status Order ID Comments User Context   07/09/2019 1057 07/09/2019 1725 Full Code [id number]  Deboraha Sprang, MD Inpatient   08/26/2014 1001 08/26/2014 1543 Full Code [id number]  [place], Peter M, MD Inpatient   06/07/2014 1324 06/07/2014 2149 Full Code [id number]  [name], MD Inpatient   05/30/2014 0838 05/31/2014 0337 Full Code [id number]  Marybelle Killings, MD [facility]   Advance Care Planning Activity    Advance Directive Documentation     Most Recent Value  Type of Advance Directive  Healthcare Power of Attorney, Living will  Pre-existing out of facility DNR order (yellow form or pink MOST form)  -  "MOST" Form in Place?  -      TOTAL TIME TAKING CARE OF THIS PATIENT: 35 minutes.    Loletha Grayer M.D on 08/28/2019 at 3:00 PM  Between 7am to 6pm - Pager - [phone]  After 6pm go to [email or web address] - Proofreader  [employer] Office  [phone]  CC: Primary care physician; Chrismon, [name], PA

## 2019-08-30 ENCOUNTER — Emergency Department: Payer: Medicare Other

## 2019-08-30 ENCOUNTER — Encounter: Payer: Self-pay | Admitting: *Deleted

## 2019-08-30 ENCOUNTER — Other Ambulatory Visit: Payer: Self-pay

## 2019-08-30 ENCOUNTER — Inpatient Hospital Stay
Admission: EM | Admit: 2019-08-30 | Discharge: 2019-09-02 | DRG: 815 | Disposition: A | Discharge status: Home Health | Payer: Medicare Other | Attending: Vascular Surgery | Admitting: Vascular Surgery

## 2019-08-30 DIAGNOSIS — N179 Acute kidney failure, unspecified: Secondary | ICD-10-CM | POA: Diagnosis present

## 2019-08-30 DIAGNOSIS — Z981 Arthrodesis status: Secondary | ICD-10-CM

## 2019-08-30 DIAGNOSIS — Q211 Atrial septal defect: Secondary | ICD-10-CM

## 2019-08-30 DIAGNOSIS — Z87891 Personal history of nicotine dependence: Secondary | ICD-10-CM

## 2019-08-30 DIAGNOSIS — Z85828 Personal history of other malignant neoplasm of skin: Secondary | ICD-10-CM

## 2019-08-30 DIAGNOSIS — I714 Abdominal aortic aneurysm, without rupture: Secondary | ICD-10-CM | POA: Diagnosis not present

## 2019-08-30 DIAGNOSIS — I749 Embolism and thrombosis of unspecified artery: Secondary | ICD-10-CM | POA: Diagnosis present

## 2019-08-30 DIAGNOSIS — N281 Cyst of kidney, acquired: Secondary | ICD-10-CM | POA: Diagnosis not present

## 2019-08-30 DIAGNOSIS — Z85118 Personal history of other malignant neoplasm of bronchus and lung: Secondary | ICD-10-CM

## 2019-08-30 DIAGNOSIS — Z96653 Presence of artificial knee joint, bilateral: Secondary | ICD-10-CM | POA: Diagnosis present

## 2019-08-30 DIAGNOSIS — Z8249 Family history of ischemic heart disease and other diseases of the circulatory system: Secondary | ICD-10-CM

## 2019-08-30 DIAGNOSIS — E785 Hyperlipidemia, unspecified: Secondary | ICD-10-CM | POA: Diagnosis present

## 2019-08-30 DIAGNOSIS — I456 Pre-excitation syndrome: Secondary | ICD-10-CM | POA: Diagnosis present

## 2019-08-30 DIAGNOSIS — I110 Hypertensive heart disease with heart failure: Secondary | ICD-10-CM | POA: Diagnosis present

## 2019-08-30 DIAGNOSIS — K219 Gastro-esophageal reflux disease without esophagitis: Secondary | ICD-10-CM | POA: Diagnosis present

## 2019-08-30 DIAGNOSIS — I428 Other cardiomyopathies: Secondary | ICD-10-CM | POA: Diagnosis not present

## 2019-08-30 DIAGNOSIS — I2510 Atherosclerotic heart disease of native coronary artery without angina pectoris: Secondary | ICD-10-CM | POA: Diagnosis present

## 2019-08-30 DIAGNOSIS — D735 Infarction of spleen: Principal | ICD-10-CM | POA: Diagnosis present

## 2019-08-30 DIAGNOSIS — Z95820 Peripheral vascular angioplasty status with implants and grafts: Secondary | ICD-10-CM

## 2019-08-30 DIAGNOSIS — I4891 Unspecified atrial fibrillation: Secondary | ICD-10-CM | POA: Diagnosis present

## 2019-08-30 DIAGNOSIS — R0989 Other specified symptoms and signs involving the circulatory and respiratory systems: Secondary | ICD-10-CM | POA: Diagnosis not present

## 2019-08-30 DIAGNOSIS — Z8349 Family history of other endocrine, nutritional and metabolic diseases: Secondary | ICD-10-CM

## 2019-08-30 DIAGNOSIS — I5042 Chronic combined systolic (congestive) and diastolic (congestive) heart failure: Secondary | ICD-10-CM | POA: Diagnosis present

## 2019-08-30 DIAGNOSIS — Z7984 Long term (current) use of oral hypoglycemic drugs: Secondary | ICD-10-CM

## 2019-08-30 DIAGNOSIS — I700 Atherosclerosis of aorta: Secondary | ICD-10-CM | POA: Diagnosis present

## 2019-08-30 DIAGNOSIS — Z8546 Personal history of malignant neoplasm of prostate: Secondary | ICD-10-CM

## 2019-08-30 DIAGNOSIS — M1990 Unspecified osteoarthritis, unspecified site: Secondary | ICD-10-CM | POA: Diagnosis present

## 2019-08-30 DIAGNOSIS — Z950 Presence of cardiac pacemaker: Secondary | ICD-10-CM

## 2019-08-30 DIAGNOSIS — Z20828 Contact with and (suspected) exposure to other viral communicable diseases: Secondary | ICD-10-CM | POA: Diagnosis present

## 2019-08-30 DIAGNOSIS — Z79899 Other long term (current) drug therapy: Secondary | ICD-10-CM

## 2019-08-30 DIAGNOSIS — I442 Atrioventricular block, complete: Secondary | ICD-10-CM | POA: Diagnosis not present

## 2019-08-30 DIAGNOSIS — Z9049 Acquired absence of other specified parts of digestive tract: Secondary | ICD-10-CM

## 2019-08-30 DIAGNOSIS — Z7982 Long term (current) use of aspirin: Secondary | ICD-10-CM

## 2019-08-30 DIAGNOSIS — Z8679 Personal history of other diseases of the circulatory system: Secondary | ICD-10-CM

## 2019-08-30 DIAGNOSIS — Z7902 Long term (current) use of antithrombotics/antiplatelets: Secondary | ICD-10-CM

## 2019-08-30 DIAGNOSIS — E1151 Type 2 diabetes mellitus with diabetic peripheral angiopathy without gangrene: Secondary | ICD-10-CM | POA: Diagnosis present

## 2019-08-30 DIAGNOSIS — G4730 Sleep apnea, unspecified: Secondary | ICD-10-CM | POA: Diagnosis present

## 2019-08-30 LAB — PROTIME-INR
INR: 1.1 (ref 0.8–1.2)
Prothrombin Time: 14.1 seconds (ref 11.4–15.2)

## 2019-08-30 LAB — URINALYSIS, COMPLETE (UACMP) WITH MICROSCOPIC
Bacteria, UA: NONE SEEN
Bilirubin Urine: NEGATIVE
Glucose, UA: NEGATIVE mg/dL
Hgb urine dipstick: NEGATIVE
Ketones, ur: 20 mg/dL — AB
Leukocytes,Ua: NEGATIVE
Nitrite: NEGATIVE
Protein, ur: NEGATIVE mg/dL
Specific Gravity, Urine: 1.029 (ref 1.005–1.030)
pH: 6 (ref 5.0–8.0)

## 2019-08-30 LAB — BASIC METABOLIC PANEL
Anion gap: 14 (ref 5–15)
BUN: 32 mg/dL — ABNORMAL HIGH (ref 8–23)
CO2: 21 mmol/L — ABNORMAL LOW (ref 22–32)
Calcium: 9.3 mg/dL (ref 8.9–10.3)
Chloride: 95 mmol/L — ABNORMAL LOW (ref 98–111)
Creatinine, Ser: 1.32 mg/dL — ABNORMAL HIGH (ref 0.61–1.24)
GFR calc Af Amer: 56 mL/min — ABNORMAL LOW (ref >60)
GFR calc non Af Amer: 49 mL/min — ABNORMAL LOW (ref >60)
Glucose, Bld: 171 mg/dL — ABNORMAL HIGH (ref 70–99)
Potassium: 4.6 mmol/L (ref 3.5–5.1)
Sodium: 130 mmol/L — ABNORMAL LOW (ref 135–145)

## 2019-08-30 LAB — APTT: aPTT: 41 seconds — ABNORMAL HIGH (ref 24–36)

## 2019-08-30 LAB — CBC
HCT: 37.8 % — ABNORMAL LOW (ref 39.0–52.0)
Hemoglobin: 12.4 g/dL — ABNORMAL LOW (ref 13.0–17.0)
MCH: 26.7 pg (ref 26.0–34.0)
MCHC: 32.8 g/dL (ref 30.0–36.0)
MCV: 81.3 fL (ref 80.0–100.0)
Platelets: 271 10*3/uL (ref 150–400)
RBC: 4.65 MIL/uL (ref 4.22–5.81)
RDW: 13.9 % (ref 11.5–15.5)
WBC: 8.1 10*3/uL (ref 4.0–10.5)
nRBC: 0 % (ref 0.0–0.2)

## 2019-08-30 LAB — MAGNESIUM: Magnesium: 2 mg/dL (ref 1.7–2.4)

## 2019-08-30 LAB — SARS CORONAVIRUS 2 BY RT PCR (HOSPITAL ORDER, PERFORMED IN ~~LOC~~ HOSPITAL LAB): SARS Coronavirus 2: NEGATIVE

## 2019-08-30 MED ORDER — ISOSORBIDE MONONITRATE ER 30 MG PO TB24
30.0000 mg | ORAL_TABLET | Freq: Every day | ORAL | Status: DC
  Administered 2019-08-31 – 2019-09-02 (×3): 30 mg via ORAL
  Filled 2019-08-30 (×3): qty 1

## 2019-08-30 MED ORDER — HEPARIN BOLUS VIA INFUSION
6000.0000 [IU] | Freq: Once | INTRAVENOUS | Status: AC
  Administered 2019-08-30: 6000 [IU] via INTRAVENOUS
  Filled 2019-08-30: qty 6000

## 2019-08-30 MED ORDER — ADULT MULTIVITAMIN W/MINERALS CH
1.0000 | ORAL_TABLET | Freq: Every day | ORAL | Status: DC
  Administered 2019-08-31 – 2019-09-02 (×3): 1 via ORAL
  Filled 2019-08-30 (×3): qty 1

## 2019-08-30 MED ORDER — MONTELUKAST SODIUM 10 MG PO TABS: 10.0000 mg | ORAL_TABLET | Freq: Every day | ORAL | Status: DC | PRN

## 2019-08-30 MED ORDER — SENNA 8.6 MG PO TABS
1.0000 | ORAL_TABLET | Freq: Two times a day (BID) | ORAL | Status: DC
  Administered 2019-08-30 – 2019-09-02 (×5): 8.6 mg via ORAL
  Filled 2019-08-30 (×5): qty 1

## 2019-08-30 MED ORDER — ONDANSETRON HCL 4 MG/2ML IJ SOLN: 4.0000 mg | Freq: Four times a day (QID) | INTRAMUSCULAR | Status: DC | PRN

## 2019-08-30 MED ORDER — ONDANSETRON HCL 4 MG/2ML IJ SOLN
4.0000 mg | Freq: Once | INTRAMUSCULAR | Status: AC
  Administered 2019-08-30: 14:00:00 4 mg via INTRAVENOUS
  Filled 2019-08-30: qty 2

## 2019-08-30 MED ORDER — LABETALOL HCL 5 MG/ML IV SOLN: 10.0000 mg | INTRAVENOUS | Status: DC | PRN

## 2019-08-30 MED ORDER — OXYCODONE HCL 5 MG PO TABS: 5.0000 mg | ORAL_TABLET | ORAL | Status: DC | PRN

## 2019-08-30 MED ORDER — METOPROLOL TARTRATE 5 MG/5ML IV SOLN: 2.0000 mg | INTRAVENOUS | Status: DC | PRN

## 2019-08-30 MED ORDER — FENTANYL 12 MCG/HR TD PT72
1.0000 | MEDICATED_PATCH | TRANSDERMAL | Status: DC
  Administered 2019-09-01: 1 via TRANSDERMAL
  Filled 2019-08-30: qty 1

## 2019-08-30 MED ORDER — SODIUM CHLORIDE 0.9 % IV SOLN
INTRAVENOUS | Status: DC
  Administered 2019-08-30: 23:00:00 via INTRAVENOUS

## 2019-08-30 MED ORDER — FLUTICASONE PROPIONATE 50 MCG/ACT NA SUSP
2.0000 | Freq: Every day | NASAL | Status: DC | PRN
  Filled 2019-08-30: qty 16

## 2019-08-30 MED ORDER — IOHEXOL 300 MG/ML  SOLN
100.0000 mL | Freq: Once | INTRAMUSCULAR | Status: AC | PRN
  Administered 2019-08-30: 100 mL via INTRAVENOUS

## 2019-08-30 MED ORDER — METOPROLOL SUCCINATE ER 25 MG PO TB24
25.0000 mg | ORAL_TABLET | Freq: Every day | ORAL | Status: DC
  Administered 2019-08-30 – 2019-09-01 (×3): 25 mg via ORAL
  Filled 2019-08-30 (×3): qty 1

## 2019-08-30 MED ORDER — SIMVASTATIN 20 MG PO TABS
40.0000 mg | ORAL_TABLET | Freq: Every day | ORAL | Status: DC
  Administered 2019-08-30 – 2019-09-01 (×3): 40 mg via ORAL
  Filled 2019-08-30: qty 2
  Filled 2019-08-30 (×3): qty 4

## 2019-08-30 MED ORDER — ASPIRIN EC 81 MG PO TBEC
81.0000 mg | DELAYED_RELEASE_TABLET | Freq: Every day | ORAL | Status: DC
  Administered 2019-08-31 – 2019-09-02 (×3): 81 mg via ORAL
  Filled 2019-08-30 (×3): qty 1

## 2019-08-30 MED ORDER — VITAMIN D 25 MCG (1000 UNIT) PO TABS
1000.0000 [IU] | ORAL_TABLET | Freq: Every day | ORAL | Status: DC
  Administered 2019-08-31 – 2019-09-02 (×3): 1000 [IU] via ORAL
  Filled 2019-08-30 (×3): qty 1

## 2019-08-30 MED ORDER — IOHEXOL 9 MG/ML PO SOLN
500.0000 mL | ORAL | Status: AC
  Administered 2019-08-30: 14:00:00 500 mL via ORAL

## 2019-08-30 MED ORDER — ALUM & MAG HYDROXIDE-SIMETH 200-200-20 MG/5ML PO SUSP: 15.0000 mL | ORAL | Status: DC | PRN

## 2019-08-30 MED ORDER — AMOXICILLIN-POT CLAVULANATE 875-125 MG PO TABS
1.0000 | ORAL_TABLET | Freq: Two times a day (BID) | ORAL | Status: AC
  Administered 2019-08-30 – 2019-09-01 (×5): 1 via ORAL
  Filled 2019-08-30 (×5): qty 1

## 2019-08-30 MED ORDER — CLOPIDOGREL BISULFATE 75 MG PO TABS
75.0000 mg | ORAL_TABLET | Freq: Every day | ORAL | Status: DC
  Administered 2019-08-31: 75 mg via ORAL
  Filled 2019-08-30: qty 1

## 2019-08-30 MED ORDER — METFORMIN HCL 500 MG PO TABS
500.0000 mg | ORAL_TABLET | Freq: Two times a day (BID) | ORAL | Status: DC
  Administered 2019-08-31 – 2019-09-02 (×5): 500 mg via ORAL
  Filled 2019-08-30 (×5): qty 1

## 2019-08-30 MED ORDER — FLEET ENEMA 7-19 GM/118ML RE ENEM: 1.0000 | ENEMA | Freq: Once | RECTAL | Status: DC | PRN

## 2019-08-30 MED ORDER — POLYETHYLENE GLYCOL 3350 17 G PO PACK
17.0000 g | PACK | Freq: Every day | ORAL | Status: DC | PRN
  Administered 2019-08-31 – 2019-09-01 (×2): 17 g via ORAL
  Filled 2019-08-30 (×2): qty 1

## 2019-08-30 MED ORDER — ACETAMINOPHEN 325 MG PO TABS: 325.0000 mg | ORAL_TABLET | ORAL | Status: DC | PRN

## 2019-08-30 MED ORDER — SODIUM CHLORIDE 0.9 % IV BOLUS
1000.0000 mL | Freq: Once | INTRAVENOUS | Status: AC
  Administered 2019-08-30: 1000 mL via INTRAVENOUS

## 2019-08-30 MED ORDER — HEPARIN (PORCINE) 25000 UT/250ML-% IV SOLN
1500.0000 [IU]/h | INTRAVENOUS | Status: DC
  Administered 2019-08-30 – 2019-08-31 (×2): 1600 [IU]/h via INTRAVENOUS
  Administered 2019-09-01: 1500 [IU]/h via INTRAVENOUS
  Filled 2019-08-30 (×3): qty 250

## 2019-08-30 MED ORDER — ACETAMINOPHEN 325 MG RE SUPP
325.0000 mg | RECTAL | Status: DC | PRN
  Filled 2019-08-30: qty 2

## 2019-08-30 MED ORDER — HYDRALAZINE HCL 20 MG/ML IJ SOLN: 5.0000 mg | INTRAMUSCULAR | Status: DC | PRN

## 2019-08-30 MED ORDER — PANTOPRAZOLE SODIUM 40 MG PO TBEC
40.0000 mg | DELAYED_RELEASE_TABLET | Freq: Every day | ORAL | Status: DC
  Administered 2019-08-31 – 2019-09-02 (×3): 40 mg via ORAL
  Filled 2019-08-30 (×3): qty 1

## 2019-08-30 MED ORDER — BISACODYL 10 MG RE SUPP: 10.0000 mg | Freq: Every day | RECTAL | Status: DC | PRN

## 2019-08-30 NOTE — ED Notes (Signed)
2nd IV insertion attempted, unsuccessful. IV team consult placed.

## 2019-08-30 NOTE — ED Notes (Signed)
Pt O2 sat dropping to 89-90 on room air, pt put on O2 2L [place]. Pt up to 96% on 2 L [place]

## 2019-08-30 NOTE — ED Notes (Signed)
Patient transported to CT 

## 2019-08-30 NOTE — ED Provider Notes (Signed)
[facility] Emergency Department Provider Note  ____________________________________________   First MD Initiated Contact with Patient 08/30/19 1148     (approximate)  I have reviewed the triage vital signs and the nursing notes.   HISTORY  Chief Complaint Urinary Retention    HPI Howard [name] is a 85 y.o. male with peripheral artery disease, diabetes, CHF, recently admitted with cellulitis and ischemic left first toe status post bilateral iliac stent placement and aortic stent graft placement and right renal stent placement on 9/2 who is currently on aspirin and Plavix who now presents with difficulty urinating.  Patient presented with urinary retention.  Patient says he has not urinated in 2 days.  He is also not eating as well due to his having some nausea.  He denies really any abdominal pain just feels nauseous.  He says he had not had a bowel movement in 1 week since his discharge.  He has not been taking any over-the-counter to help with bowel movements.  His urine retention is constant, 2 days, nothing makes better, nothing makes it worse.  Denies any fevers cough shortness of breath or chest pain.          Past Medical History:  Diagnosis Date  . Arthritis   . Bell palsy   . Cancer [facility])    prostate and skin  . Chronic combined systolic and diastolic CHF, NYHA class 1 ([place])    a. 07/2014 Echo: EF 35-40%, Gr 1 DD.  Marland Kitchen Complete heart block ([place])    a. 11/2010 s/p SJM 2210 Accent DC PPM, ser# [id number].  . Depression   . Diabetes mellitus without complication ([place])   . Fall 11-10-14  . GERD (gastroesophageal reflux disease)   . History of prostate cancer   . Hyperlipidemia   . Hypertension   . LBBB (left bundle branch block)   . Left-sided Bell's palsy   . Lung cancer ([place]) 2016  . NICM (nonischemic cardiomyopathy) ([place])    a. 07/2014 Echo: EF 35-40%, mid-apicalanteroseptal DK, Gr 1 DD, mild-mod dil LA.  . Non-obstructive CAD    a. 07/2014  Abnl MV;  b. 08/2014 Cath: LM nl, LAD 30p, RI 40p, LCX nl, OM1 40, RCA dominant 30p, 70d-->Med Rx.  Marland Kitchen Poor balance   . Presence of permanent cardiac pacemaker   . Sleep apnea    a. cpap  . Vertigo   . WPW (Wolff-Parkinson-White syndrome)    a. S/P RFCA 1991.    Patient Active Problem List   Diagnosis Date Noted  . Atherosclerosis of native arteries of extremity with intermittent claudication ([place]) 08/23/2019  . Cellulitis 08/22/2019  . Swelling of limb 08/17/2019  . Pain in limb 08/17/2019  . NICM (nonischemic cardiomyopathy) ([place]) 07/01/2019  . CHF (congestive heart failure) ([place]) 07/01/2019  . Malignant neoplasm of right lung ([place]) 04/18/2016  . CAD (coronary artery disease) 04/01/2016  . Adenocarcinoma ([place]) 04/01/2016  . Skin cyst 12/26/2015  . GERD (gastroesophageal reflux disease) 06/16/2015  . Abscess of back 06/06/2015  . Vertigo 03/27/2015  . Carpal tunnel syndrome 10/28/2014  . Status post cholecystectomy 09/29/2014  . Disease of digestive tract 09/29/2014  . Cardiomyopathy ([place]) 09/26/2014  . Type 2 diabetes mellitus without complications ([place])   . Sleep apnea   . Spinal stenosis, lumbar region, with neurogenic claudication 06/07/2014  . Lumbar stenosis with neurogenic claudication 06/07/2014  . Abnormal gait 08/20/2012  . H/O total knee replacement 08/20/2012  . Arthritis of knee, degenerative 08/20/2012  .  Pacemaker-St.Jude 08/03/2012  . Cardiac conduction disorder 06/19/2012  . Acid reflux 06/18/2012  . 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 Triggering of digit 03/25/2012  . Hyperlipidemia 12/23/2011  . Essential hypertension 03/23/2011  . Atrioventricular block, complete ([place]) 03/23/2011  . Complete atrioventricular block ([place]) 03/23/2011    Past Surgical History:  Procedure Laterality Date  . ABDOMINAL AORTIC ENDOVASCULAR STENT GRAFT  08/25/2019   Procedure: ABDOMINAL AORTIC ENDOVASCULAR STENT GRAFT;  Surgeon: Algernon Huxley, MD;  Location: [facility] ORS;   Service: Vascular;;  . ANGIOPLASTY Left 08/25/2019   Procedure: ANGIOPLASTY;  Surgeon: Algernon Huxley, MD;  Location: [facility] ORS;  Service: Vascular;  Laterality: Left;  left SFA and stent placement  . APPLICATION OF WOUND VAC Left 06/07/2015   Procedure: APPLICATION OF WOUND VAC;  Surgeon: Robert Bellow, MD;  Location: [facility] ORS;  Service: General;  Laterality: Left;  left upper back  . BACK SURGERY     2011  . CARDIAC CATHETERIZATION  08/26/2014   Single vessel obstructive CAD  . CARPAL TUNNEL RELEASE  04-04-15   [facility]  . CATARACT EXTRACTION  07-31-11 and 09-18-11  . Catheter ablation  1991   for WPW  . cervical fusion    . CHOLECYSTECTOMY  09-07-14  . ENDARTERECTOMY FEMORAL Left 08/25/2019   Procedure: ENDARTERECTOMY FEMORAL;  Surgeon: Algernon Huxley, MD;  Location: [facility] ORS;  Service: Vascular;  Laterality: Left;  common and produndis   . ENDOVASCULAR REPAIR/STENT GRAFT Right 08/25/2019   Procedure: ENDOVASCULAR REPAIR/STENT GRAFT;  Surgeon: Algernon Huxley, MD;  Location: [facility] ORS;  Service: Vascular;  Laterality: Right;  renal artery  . HAND SURGERY     right 1993; left 2005  . HERNIA REPAIR  1955  . INSERT / REPLACE / REMOVE PACEMAKER    . INSERTION OF ILIAC STENT Bilateral 08/25/2019   Procedure: INSERTION OF ILIAC STENT;  Surgeon: Algernon Huxley, MD;  Location: [facility] ORS;  Service: Vascular;  Laterality: Bilateral;  . JOINT REPLACEMENT Left 2013   knee  . JOINT REPLACEMENT Right 2004   knee  . KNEE SURGERY     left knee 1991 and 1992; right knee 1995  . LEFT HEART CATHETERIZATION WITH CORONARY ANGIOGRAM N/A 08/26/2014   Procedure: LEFT HEART CATHETERIZATION WITH CORONARY ANGIOGRAM;  Surgeon: [name], MD;  Location: [facility] CATH LAB;  Service: Cardiovascular;  Laterality: N/A;  . LOWER EXTREMITY ANGIOGRAPHY Left 08/23/2019   Procedure: Lower Extremity Angiography;  Surgeon: Algernon Huxley, MD;  Location: [place] CV LAB;  Service: Cardiovascular;  Laterality: Left;  . LUMBAR  LAMINECTOMY/DECOMPRESSION MICRODISCECTOMY N/A 06/07/2014   Procedure: LUMBAR FOUR TO FIVE LUMBAR LAMINECTOMY/DECOMPRESSION MICRODISCECTOMY 1 LEVEL;  Surgeon: [name], MD;  Location: [place] NEURO ORS;  Service: Neurosurgery;  Laterality: N/A;  . LUNG BIOPSY Right 2016   Dr [name]  . PACEMAKER INSERTION     PPM-- St Jude 11/30/10 by [name]  . PPM GENERATOR CHANGEOUT N/A 07/09/2019   Procedure: PPM GENERATOR CHANGEOUT;  Surgeon: Deboraha Sprang, MD;  Location: [place] CV LAB;  Service: Cardiovascular;  Laterality: N/A;  . PROSTATE SURGERY     cancer--1998, prostatectomy  . REPLACEMENT TOTAL KNEE     2004  . ruptured disc     1962 and 1998  . TEMPORARY PACEMAKER N/A 07/09/2019   Procedure: TEMPORARY PACEMAKER;  Surgeon: Deboraha Sprang, MD;  Location: [place] CV LAB;  Service: Cardiovascular;  Laterality: N/A;  . TRIGGER FINGER RELEASE  01-24-15  .  WOUND DEBRIDEMENT Left 06/07/2015   Procedure: DEBRIDEMENT WOUND;  Surgeon: Robert Bellow, MD;  Location: [facility] ORS;  Service: General;  Laterality: Left;  left upper back    Prior to Admission medications   Medication Sig Start Date End Date Taking? Authorizing Provider  amoxicillin-clavulanate (AUGMENTIN) 875-125 MG tablet Take 1 tablet by mouth 2 (two) times daily for 6 doses. 08/28/19 08/31/19  Loletha Grayer, MD  aspirin EC 81 MG tablet Take 81 mg by mouth daily.    [provider]  Cholecalciferol (VITAMIN D3) 1000 UNITS CAPS Take 1,000 Units by mouth daily.     [provider]  clopidogrel (PLAVIX) 75 MG tablet Take 1 tablet (75 mg total) by mouth daily. 08/29/19   Loletha Grayer, MD  fentaNYL (DURAGESIC) 12 MCG/HR Place 1 patch onto the skin every 3 (three) days. 08/02/19 09/01/19  Molli Barrows, MD  fluticasone (FLONASE) 50 MCG/ACT nasal spray Place 2 sprays into both nostrils daily.    [provider]  isosorbide mononitrate (IMDUR) 30 MG 24 hr tablet TAKE 1 TABLET BY MOUTH DAILY Patient taking differently: Take 30  mg by mouth daily.  08/02/19   [name], MD  metFORMIN (GLUCOPHAGE) 500 MG tablet Take 1 tablet (500 mg total) by mouth 2 (two) times daily with a meal. 11/27/18   Chrismon, [name], PA  metoprolol succinate (TOPROL-XL) 25 MG 24 hr tablet Take 1 tablet (25 mg) by mouth once daily at bedtime Patient taking differently: Take 25 mg by mouth at bedtime.  11/26/18   [name], MD  montelukast (SINGULAIR) 10 MG tablet Take 10 mg by mouth daily as needed (sneezing/allergies.).     [provider]  Multiple Vitamin (MULTIVITAMIN WITH MINERALS) TABS tablet Take 1 tablet by mouth daily. One-A-Day Multivitamin    [provider]  mupirocin ointment (BACTROBAN) 2 % Apply to affected area 3 times daily 08/21/19 08/20/20  Blake Divine, MD  omeprazole (PRILOSEC) 40 MG capsule Take 1 capsule (40 mg total) by mouth 2 (two) times daily as needed. Patient taking differently: Take 40 mg by mouth 2 (two) times a day.  12/25/18   Chrismon, [name], PA  sacubitril-valsartan (ENTRESTO) 24-26 MG Take 1 tablet by mouth 2 (two) times daily. 08/28/19   Loletha Grayer, MD  simvastatin (ZOCOR) 40 MG tablet TAKE 1 TABLET AT BEDTIME Patient taking differently: Take 40 mg by mouth at bedtime.  08/11/19   [name], MD  spironolactone (ALDACTONE) 50 MG tablet Take 1 tablet (50 mg) by mouth once daily in the morning Patient taking differently: Take 50 mg by mouth every morning.  11/24/18   [name], MD    Allergies Sulfa antibiotics  Family History  Problem Relation Age of Onset  . Heart attack Mother   . Hyperlipidemia Mother   . CAD Other   . Prostate cancer Neg Hx     Social History Social History   Tobacco Use  . Smoking status: Former Smoker    Years: 4.00  . Smokeless tobacco: Never Used  . Tobacco comment: Quit 2011  Substance Use Topics  . Alcohol use: No    Frequency: Never  . Drug use: No      Review of Systems Constitutional: No fever/chills Eyes:  No visual changes. ENT: No sore throat. Cardiovascular: Denies chest pain. Respiratory: Denies shortness of breath. Gastrointestinal: No abdominal pain. no vomiting.  No diarrhea.  Does not have constipation, nausea Genitourinary: Negative for dysuria.  Positive for decreased urine output Musculoskeletal: Negative for back pain. Skin: Negative for rash. Neurological: Negative for headaches, focal weakness or numbness. All other ROS negative ____________________________________________   PHYSICAL EXAM:  VITAL SIGNS: ED Triage Vitals [08/30/19 1105]  Enc Vitals Group     BP 137/79     Pulse Rate (!) 106     Resp 16     Temp 98.7 F (37.1 C)     Temp Source Oral     SpO2 95 %     Weight 198 lb (89.8 kg)     Height 5\' 9"  (1.753 m)     Head Circumference      Peak Flow      Pain Score 0     Pain Loc      Pain Edu?      Excl. in [place]?     Constitutional: Alert and oriented. Well appearing and in no acute distress. Eyes: Conjunctivae are normal. EOMI. Head: Atraumatic. Nose: No congestion/rhinnorhea. Mouth/Throat: Mucous membranes are moist.   Neck: No stridor. Trachea Midline. FROM Cardiovascular: Tachycardic, regular rhythm. Grossly normal heart sounds.  Good peripheral circulation. Respiratory: Normal respiratory effort.  No retractions. Lungs CTAB. Gastrointestinal: Soft and nontender. No distention. No abdominal bruits.  Musculoskeletal: No lower extremity tenderness nor edema.  No joint effusions. Neurologic:  Normal speech and language. No gross focal neurologic deficits are appreciated.  Skin:  Skin is warm, dry and intact. No rash noted. Psychiatric: Mood and affect are normal. Speech and behavior are normal. GU:no large stool ball.   ____________________________________________   LABS (all labs ordered are listed, but only abnormal results are displayed)  Labs Reviewed  CBC - Abnormal; Notable for the following components:      Result Value   Hemoglobin 12.4  (*)    HCT 37.8 (*)    All other components within normal limits  BASIC METABOLIC PANEL - Abnormal; Notable for the following components:   Sodium 130 (*)    Chloride 95 (*)    CO2 21 (*)    Glucose, Bld 171 (*)    BUN 32 (*)    Creatinine, Ser 1.32 (*)    GFR calc non Af Amer 49 (*)    GFR calc Af Amer 56 (*)    All other components within normal limits  SARS CORONAVIRUS 2 (HOSPITAL ORDER, [place] [facility])  MAGNESIUM  URINALYSIS, COMPLETE (UACMP) WITH MICROSCOPIC   ____________________________________________   ED ECG REPORT I, Vanessa [place], the attending physician, personally viewed and interpreted this ECG.   ____________________________________________  RADIOLOGY I, Vanessa Amo, personally viewed and evaluated these images (plain radiographs) as part of my medical decision making, as well as reviewing the written report by the radiologist.  ED MD interpretation: X-ray some central congestion but no obvious edema Official radiology report(s): Ct Abdomen Pelvis W Contrast  Result Date: 08/30/2019 CLINICAL DATA:  Has not urinated in multiple days, acute generalized abdominal pain, nausea, recent stent surgery, prior prostatectomy, cholecystectomy, past history prostate cancer, lung cancer, type II diabetes mellitus, GERD, hypertension, former smoker EXAM: CT ABDOMEN AND PELVIS WITH CONTRAST TECHNIQUE: Multidetector CT imaging of the abdomen and pelvis was performed using the standard protocol following bolus administration of intravenous contrast. Sagittal and coronal MPR images reconstructed from axial data set. CONTRAST:  125mL OMNIPAQUE IOHEXOL 300 MG/ML SOLN IV. Dilute oral contrast. COMPARISON:  08/24/2019 FINDINGS: Lower chest: Bibasilar atelectasis. Partially calcified pleural based mass at anteromedial base of RIGHT  middle lobe unchanged. Hepatobiliary: Gallbladder surgically absent. Liver normal appearance. Pancreas: Normal appearance Spleen: New area  of low attenuation at the inferior posterior spleen consistent with infarct. Remainder of spleen unremarkable. Adrenals/Urinary Tract: Adrenal glands normal appearance. Small cyst LEFT kidney with additional fat containing lesion at inferior pole consistent with small angiomyolipoma 13 x 10 mm. No LEFT hydronephrosis. Abnormal decreased enhancement of the anterior half of the RIGHT kidney consistent with either infarct or hypoperfusion, new since prior exam. Adjacent perinephric stranding. No RIGHT renal mass or hydronephrosis. Stomach/Bowel: Normal appendix. Stomach and bowel loops normal appearance. Vascular/Lymphatic: Extensive atherosclerotic calcifications including aorta, visceral arteries, coronary arteries. Significant calcified plaque at the origins of the renal arteries, mildly at SMA. Aneurysmal dilatation of the mid abdominal aorta 3.6 x 3.0 cm image 46 with interval endoluminal stenting of the aorta and iliac arteries since prior study. Infiltrative changes at the inguinal regions bilaterally likely due to recent endovascular access. New stent at the origin of the RIGHT renal artery. No adenopathy. Reproductive: Prior prostatectomy Other: No free air or free fluid. Small umbilical hernia containing fat. Infiltrative changes from the inguinal regions extend along the LEFT external iliac artery and vein into the pelvis likely related to recent vascular procedure. Small fluid collections surrounding the bifurcations of the common femoral arteries bilaterally. Musculoskeletal: Diffuse osseous demineralization. Advanced degenerative disc and facet disease changes lumbar spine. IMPRESSION: Interval endoluminal stenting of abdominal aortic aneurysm into iliac arteries bilaterally. Impaired nephrogram at anterior half of the RIGHT kidney either representing infarct or hypoperfusion in patient with a new RIGHT renal artery stent. New small splenic infarct. Infiltrative changes at the inguinal regions bilaterally  consistent with recent endovascular access, extending along the distal aspect of the LEFT external iliac vessels. Small cyst and small angiomyolipoma of the LEFT kidney. Findings called to Dr. Jari Pigg on 07/12/2019 at 1618 hours. Electronically Signed   By: Lavonia Dana M.D.   On: 08/30/2019 16:19   Dg Chest Portable 1 View  Result Date: 08/30/2019 CLINICAL DATA:  SOB per ordering notes. Per RN notes- Pt O2 sat dropping to 89-90 on room air, pt put on O2 2L [place]. Hx of HTN and diabetes. EXAM: PORTABLE CHEST 1 VIEW COMPARISON:  Chest radiograph 08/24/2019, 07/26/2018; CT chest 06/14/2019 FINDINGS: Stable cardiomediastinal contours with mildly enlarged heart size. Left chest dual pacer unchanged in position. Central venous congestion. Low volume study with bronchovascular crowding. A few scattered linear opacities at the left base likely atelectasis or scarring. No new focal infiltrate. No pneumothorax or large pleural effusion. No acute finding in the visualized skeleton. IMPRESSION: Central venous congestion without overt edema. Left basilar scarring or atelectasis. Electronically Signed   By: Audie Pinto M.D.   On: 08/30/2019 16:21    ____________________________________________   PROCEDURES  Procedure(s) performed (including Critical Care):  Procedures   ____________________________________________   INITIAL IMPRESSION / ASSESSMENT AND PLAN / ED COURSE  Howard [name] was evaluated in Emergency Department on 08/30/2019 for the symptoms described in the history of present illness. He was evaluated in the context of the global COVID-19 pandemic, which necessitated consideration that the patient might be at risk for infection with the SARS-CoV-2 virus that causes COVID-19. Institutional protocols and algorithms that pertain to the evaluation of patients at risk for COVID-19 are in a state of rapid change based on information released by regulatory bodies including the CDC and federal and state  organizations. These policies and algorithms were followed during the patient's care in the  ED.    Patient presents with constipation and urinary retention.  This could be a postop ileus.  Patient's abdomen is soft but given recent post op consider SBO.  Will do rectal exam to look for stool ball.  This could be causing urinary retention.  Will also get labs to evaluate for kidney dysfunction given the recent contrast dye and decreased p.o. intake and urinary retention.  Will get labs to evaluate for UTI  Bladder scan 200 cc   Kidney Function only slightly elevated at 1.3.  Will get CT scan to evaluate for SBO.  Bedside ultrasound confirmed that there is only around 200 cc as well.  We will give 1 L fluid to see if patient can urinate on his own  After the fluid patient did get a hypoxic and was started on 2 L.  Per nurse pt was sleeping when it happened and he does use CPAP at night.  Chest x-ray without overt edema.  However patient is still not been able to urinate  4:18 PM CT scan concerning for small splenic infarct as well as part of the kidney not being perfused with Dr. Lucky Cowboy.   4:40 PM D/w Dr. Bridgett Larsson they will admit patient for observation overnight.  Admit to the vascular team.     ____________________________________________   FINAL CLINICAL IMPRESSION(S) / ED DIAGNOSES   Final diagnoses:  Splenic infarct      MEDICATIONS GIVEN DURING THIS VISIT:  Medications  iohexol (OMNIPAQUE) 9 MG/ML oral solution 500 mL (500 mLs Oral Contrast Given 08/30/19 1402)  ondansetron (ZOFRAN) injection 4 mg (4 mg Intravenous Given 08/30/19 1349)  sodium chloride 0.9 % bolus 1,000 mL (1,000 mLs Intravenous New Bag/Given 08/30/19 1345)  iohexol (OMNIPAQUE) 300 MG/ML solution 100 mL (100 mLs Intravenous Contrast Given 08/30/19 1537)     ED Discharge Orders    None       Note:  This document was prepared using Dragon voice recognition software and may include unintentional dictation errors.    Vanessa , MD 08/30/19 [phone]

## 2019-08-30 NOTE — ED Notes (Signed)
Accidentally marked off urine specimen collected, no urine specimen collected at this point as pt unable to urinate.

## 2019-08-30 NOTE — H&P (Signed)
VASCULAR SURGERY HISTORY AND PHYSICAL   Requested by:  Dr. Marjean [facility] ED)  Reason for consultation: splenic and renal infarct    History of Present Illness   Howard [name] is a 85 y.o. (1931/05/02) male known PPM, DM, atherosclerotic aorta who presents with cc: abdominal pain.  Pt recently underwent: EVAR, R renal artery stenting, B EIA stenting, L CFA endarterectomy and patch angioplasty (08/25/19).  The patient's post-op course was unremarkable and pt was discharge on 08/28/19.  Yesterday patient started developing vague "strange sensations" in mid-abdomen.  Reportedly since his procedure, he has not been urinating "much."  Today he began getting frank, aching pains in epigastric that are vague in character, and moderate to severe in intensity.  No obvious triggers were noted.    He called me and I recommended he come to ED for work-up.  Reportedly, CT demonstrated R renal artery malperfusion and L splenic infarction.  Patient denies any cardiac arrhythmia though recently he did have his PPM battery replaced.  The patient has multiple atherosclerotic risks factors including: DM, HTN, HLD  Past Medical History:  Diagnosis Date  . Arthritis   . Bell palsy   . Cancer [facility])    prostate and skin  . Chronic combined systolic and diastolic CHF, NYHA class 1 ([place])    a. 07/2014 Echo: EF 35-40%, Gr 1 DD.  Marland Kitchen Complete heart block ([place])    a. 11/2010 s/p SJM 2210 [employer]ent DC PPM, ser# [id number].  . Depression   . Diabetes mellitus without complication ([place])   . Fall 11-10-14  . GERD (gastroesophageal reflux disease)   . History of prostate cancer   . Hyperlipidemia   . Hypertension   . LBBB (left bundle branch block)   . Left-sided Bell's palsy   . Lung cancer ([place]) 2016  . NICM (nonischemic cardiomyopathy) ([place])    a. 07/2014 Echo: EF 35-40%, mid-apicalanteroseptal DK, Gr 1 DD, mild-mod dil LA.  . Non-obstructive CAD    a. 07/2014 Abnl MV;  b. 08/2014 Cath: LM nl, LAD 30p, RI 40p, LCX nl, OM1  40, RCA dominant 30p, 70d-->Med Rx.  Marland Kitchen Poor balance   . Presence of permanent cardiac pacemaker   . Sleep apnea    a. cpap  . Vertigo   . WPW (Wolff-Parkinson-White syndrome)    a. S/P RFCA 1991.    Past Surgical History:  Procedure Laterality Date  . ABDOMINAL AORTIC ENDOVASCULAR STENT GRAFT  08/25/2019   Procedure: ABDOMINAL AORTIC ENDOVASCULAR STENT GRAFT;  Surgeon: Algernon Huxley, MD;  Location: [facility] ORS;  Service: Vascular;;  . ANGIOPLASTY Left 08/25/2019   Procedure: ANGIOPLASTY;  Surgeon: Algernon Huxley, MD;  Location: [facility] ORS;  Service: Vascular;  Laterality: Left;  left SFA and stent placement  . APPLICATION OF WOUND VAC Left 06/07/2015   Procedure: APPLICATION OF WOUND VAC;  Surgeon: Robert Bellow, MD;  Location: [facility] ORS;  Service: General;  Laterality: Left;  left upper back  . BACK SURGERY     2011  . CARDIAC CATHETERIZATION  08/26/2014   Single vessel obstructive CAD  . CARPAL TUNNEL RELEASE  04-04-15   [facility]  . CATARACT EXTRACTION  07-31-11 and 09-18-11  . Catheter ablation  1991   for WPW  . cervical fusion    . CHOLECYSTECTOMY  09-07-14  . ENDARTERECTOMY FEMORAL Left 08/25/2019   Procedure: ENDARTERECTOMY FEMORAL;  Surgeon: Algernon Huxley, MD;  Location: [facility] ORS;  Service: Vascular;  Laterality: Left;  common and produndis   .  ENDOVASCULAR REPAIR/STENT GRAFT Right 08/25/2019   Procedure: ENDOVASCULAR REPAIR/STENT GRAFT;  Surgeon: Algernon Huxley, MD;  Location: [facility] ORS;  Service: Vascular;  Laterality: Right;  renal artery  . HAND SURGERY     right 1993; left 2005  . HERNIA REPAIR  1955  . INSERT / REPLACE / REMOVE PACEMAKER    . INSERTION OF ILIAC STENT Bilateral 08/25/2019   Procedure: INSERTION OF ILIAC STENT;  Surgeon: Algernon Huxley, MD;  Location: [facility] ORS;  Service: Vascular;  Laterality: Bilateral;  . JOINT REPLACEMENT Left 2013   knee  . JOINT REPLACEMENT Right 2004   knee  . KNEE SURGERY     left knee 1991 and 1992; right knee 1995  . LEFT HEART CATHETERIZATION WITH  CORONARY ANGIOGRAM N/A 08/26/2014   Procedure: LEFT HEART CATHETERIZATION WITH CORONARY ANGIOGRAM;  Surgeon: [name], MD;  [facility] C[facility] LAB;  Service: Cardiovascular;  Laterality: N/A;  . LOWER EXTREMITY ANGIOGRAPHY Left 08/23/2019   Procedure: Lower Extremity Angiography;  Surgeon: Algernon Huxley, MD;  Location: [place] CV LAB;  Service: Cardiovascular;  Laterality: Left;  . LUMBAR LAMINECTOMY/DECOMPRESSION MICRODISCECTOMY N/A 06/07/2014   Procedure: LUMBAR FOUR TO FIVE LUMBAR LAMINECTOMY/DECOMPRESSION MICRODISCECTOMY 1 LEVEL;  Surgeon: [name], MD;  Location: [place] NEURO ORS;  Service: Neurosurgery;  Laterality: N/A;  . LUNG BIOPSY Right 2016   Dr [name]  . PACEMAKER INSERTION     PPM-- St Jude 11/30/10 by [name]  . PPM GENERATOR CHANGEOUT N/A 07/09/2019   Procedure: PPM GENERATOR CHANGEOUT;  Surgeon: Deboraha Sprang, MD;  Location: [place] CV LAB;  Service: Cardiovascular;  Laterality: N/A;  . PROSTATE SURGERY     cancer--1998, prostatectomy  . REPLACEMENT TOTAL KNEE     2004  . ruptured disc     1962 and 1998  . TEMPORARY PACEMAKER N/A 07/09/2019   Procedure: TEMPORARY PACEMAKER;  Surgeon: Deboraha Sprang, MD;  Location: [place] CV LAB;  Service: Cardiovascular;  Laterality: N/A;  . TRIGGER FINGER RELEASE  01-24-15  . WOUND DEBRIDEMENT Left 06/07/2015   Procedure: DEBRIDEMENT WOUND;  Surgeon: Robert Bellow, MD;  Location: [facility] ORS;  Service: General;  Laterality: Left;  left upper back     Social History   Socioeconomic History  . Marital status: Married    Spouse name: Not on file  . Number of children: 2  . Years of education: College  . Highest education level: Some college, no degree  Occupational History  . Occupation: Retired  Scientific laboratory technician  . Financial resource strain: Not hard at all  . Food insecurity    Worry: Never true    Inability: Never true  . Transportation needs    Medical: No    Non-medical: No  Tobacco Use  . Smoking status: Former  Smoker    Years: 4.00  . Smokeless tobacco: Never Used  . Tobacco comment: Quit 2011  Substance and Sexual Activity  . Alcohol use: No    Frequency: Never  . Drug use: No  . Sexual activity: Not on file  Lifestyle  . Physical activity    Days per week: 0 days    Minutes per session: 0 min  . Stress: Not at all  Relationships  . Social Herbalist on phone: Patient refused    Gets together: Patient refused    Attends religious service: Patient refused    Active member of club or organization: Patient refused    Attends meetings  of clubs or organizations: Patient refused    Relationship status: Patient refused  . Intimate partner violence    Fear of current or ex partner: Patient refused    Emotionally abused: Patient refused    Physically abused: Patient refused    Forced sexual activity: Patient refused  Other Topics Concern  . Not on file  Social History Narrative   Drinks 2 cups of coffee a day     Family History  Problem Relation Age of Onset  . Heart attack Mother   . Hyperlipidemia Mother   . CAD Other   . Prostate cancer Neg Hx     Current Facility-Administered Medications  Medication Dose Route Frequency Provider Last Rate Last Dose  . 0.9 %  sodium chloride infusion   Intravenous Continuous Conrad [place], MD      . acetaminophen (TYLENOL) tablet 325-650 mg  325-650 mg Oral Q4H PRN Conrad [place], MD       Or  . acetaminophen (TYLENOL) suppository 325-650 mg  325-650 mg Rectal Q4H PRN Conrad [place], MD      . alum & mag hydroxide-simeth (MAALOX/MYLANTA) 200-200-20 MG/5ML suspension 15-30 mL  15-30 mL Oral Q2H PRN Conrad [place], MD      . amoxicillin-clavulanate (AUGMENTIN) 875-125 MG per tablet 1 tablet  1 tablet Oral BID Conrad [place], MD      . aspirin EC tablet 81 mg  81 mg Oral Daily Conrad [place], MD      . bisacodyl (DULCOLAX) suppository 10 mg  10 mg Rectal Daily PRN Conrad [place], MD      . clopidogrel (PLAVIX) tablet 75 mg  75 mg Oral Daily  Conrad [place], MD      . fentaNYL (DURAGESIC) 12 MCG/HR 1 patch  1 patch Transdermal Q72H Conrad [place], MD      . fluticasone (FLONASE) 50 MCG/ACT nasal spray 2 spray  2 spray Each Nare Daily Conrad [place], MD      . hydrALAZINE (APRESOLINE) injection 5 mg  5 mg Intravenous Q20 Min PRN Conrad [place], MD      . isosorbide mononitrate (IMDUR) 24 hr tablet 30 mg  30 mg Oral Daily Conrad [place], MD      . labetalol (NORMODYNE) injection 10 mg  10 mg Intravenous Q10 min PRN Conrad [place], MD      . Derrill Memo ON 08/31/2019] metFORMIN (GLUCOPHAGE) tablet 500 mg  500 mg Oral BID WC Conrad [place], MD      . metoprolol succinate (TOPROL-XL) 24 hr tablet 25 mg  25 mg Oral QHS Conrad [place], MD      . metoprolol tartrate (LOPRESSOR) injection 2-5 mg  2-5 mg Intravenous Q2H PRN Conrad [place], MD      . montelukast (SINGULAIR) tablet 10 mg  10 mg Oral Daily PRN Conrad [place], MD      . multivitamin with minerals tablet 1 tablet  1 tablet Oral Daily Conrad [place], MD      . ondansetron [facility]) injection 4 mg  4 mg Intravenous Q6H PRN Conrad [place], MD      . oxyCODONE (Oxy IR/ROXICODONE) immediate release tablet 5-10 mg  5-10 mg Oral Q4H PRN Conrad , MD      . pantoprazole (PROTONIX) EC tablet 40 mg  40 mg Oral Daily Conrad , MD      . polyethylene glycol (MIRALAX / GLYCOLAX) packet 17 g  17 g Oral Daily PRN Conrad [place], MD      . senna [facility]) tablet 8.6 mg  1 tablet Oral BID Conrad [place], MD      . simvastatin (ZOCOR) tablet 40 mg  40 mg Oral QHS Conrad [place], MD      . sodium phosphate (FLEET) 7-19 GM/118ML enema 1 enema  1 enema Rectal Once PRN Conrad [place], MD      . Vitamin D3 CAPS 1,000 Units  1,000 Units Oral Daily Conrad [place], MD       Current Outpatient Medications  Medication Sig Dispense Refill  . amoxicillin-clavulanate (AUGMENTIN) 875-125 MG tablet Take 1 tablet by mouth 2 (two) times daily for 6 doses. 6 tablet 0  . aspirin EC 81 MG tablet Take 81 mg by mouth daily.    .  Cholecalciferol (VITAMIN D3) 1000 UNITS CAPS Take 1,000 Units by mouth daily.     . clopidogrel (PLAVIX) 75 MG tablet Take 1 tablet (75 mg total) by mouth daily. 30 tablet 0  . fentaNYL (DURAGESIC) 12 MCG/HR Place 1 patch onto the skin every 3 (three) days. 10 patch 0  . isosorbide mononitrate (IMDUR) 30 MG 24 hr tablet TAKE 1 TABLET BY MOUTH DAILY (Patient taking differently: Take 30 mg by mouth daily. ) 90 tablet 1  . metFORMIN (GLUCOPHAGE) 500 MG tablet Take 1 tablet (500 mg total) by mouth 2 (two) times daily with a meal. 180 tablet 3  . metoprolol succinate (TOPROL-XL) 25 MG 24 hr tablet Take 1 tablet (25 mg) by mouth once daily at bedtime (Patient taking differently: Take 25 mg by mouth at bedtime. ) 90 tablet 3  . montelukast (SINGULAIR) 10 MG tablet Take 10 mg by mouth daily as needed (sneezing/allergies.).     Marland Kitchen Multiple Vitamin (MULTIVITAMIN WITH MINERALS) TABS tablet Take 1 tablet by mouth daily. One-A-Day Multivitamin    . omeprazole (PRILOSEC) 40 MG capsule Take 1 capsule (40 mg total) by mouth 2 (two) times daily as needed. (Patient taking differently: Take 40 mg by mouth 2 (two) times a day. ) 180 capsule 3  . sacubitril-valsartan (ENTRESTO) 24-26 MG Take 1 tablet by mouth 2 (two) times daily. 60 tablet 0  . simvastatin (ZOCOR) 40 MG tablet TAKE 1 TABLET AT BEDTIME (Patient taking differently: Take 40 mg by mouth at bedtime. ) 90 tablet 0  . spironolactone (ALDACTONE) 50 MG tablet Take 1 tablet (50 mg) by mouth once daily in the morning (Patient taking differently: Take 50 mg by mouth every morning. ) 90 tablet 3  . fluticasone (FLONASE) 50 MCG/ACT nasal spray Place 2 sprays into both nostrils daily.    . mupirocin ointment (BACTROBAN) 2 % Apply to affected area 3 times daily (Patient not taking: Reported on 08/30/2019) 22 g 0    Allergies  Allergen Reactions  . Sulfa Antibiotics Rash    REVIEW OF SYSTEMS (negative unless checked):   Cardiac:  []  Chest pain or chest pressure? []   Shortness of breath upon activity? []  Shortness of breath when lying flat? []  Irregular heart rhythm?  Vascular:  []  Pain in calf, thigh, or hip brought on by walking? []  Pain in feet at night that wakes you up from your sleep? []  Blood clot in your veins? []  Leg swelling?  Pulmonary:  []  Oxygen at home? []  Productive cough? []  Wheezing?  Neurologic:  []  Sudden weakness in arms or legs? []  Sudden numbness in arms or legs? []  Sudden onset of  difficult speaking or slurred speech? []  Temporary loss of vision in one eye? []  Problems with dizziness?  Gastrointestinal:  []  Blood in stool? []  Vomited blood?  Genitourinary:  []  Burning when urinating? []  Blood in urine? [x]  Decreased urine?  Psychiatric:  []  Major depression  Hematologic:  []  Bleeding problems? []  Problems with blood clotting?  Dermatologic:  []  Rashes or ulcers?  Constitutional:  []  Fever or chills?  Ear/Nose/Throat:  []  Change in hearing? []  Nose bleeds? []  Sore throat?  Musculoskeletal:  []  Back pain? []  Joint pain? []  Muscle pain?   Physical Examination     Vitals:   08/30/19 1330 08/30/19 1530 08/30/19 1600 08/30/19 1700  BP: 138/78 (!) 149/76 (!) 154/68 (!) 155/83  Pulse: 83 (!) 104 91 (!) 102  Resp: 16 (!) 23 17 20   Temp:      TempSrc:      SpO2: 95% (!) 87% 100% 100%  Weight:      Height:       Body mass index is 29.24 kg/m.  General Alert, O x 3, WD, Elderly  Head /AT,    Ear/Nose/ Throat Hearing grossly intact, nares without erythema or drainage, oropharynx without Erythema or Exudate, Mallampati score: 3,   Eyes PERRLA, EOMI,    Neck Supple, mid-line trachea,    Pulmonary Sym exp, good B air movt, CTA B  Cardiac RRR, Nl S1, S2, no Murmurs, No rubs, No S3,S4  Vascular Vessel Right Left  Radial Palpable Palpable  Brachial Palpable Palpable  Carotid Palpable, No Bruit Palpable, No Bruit  Aorta Not palpable N/A  Femoral Palpable Palpable  Popliteal Not palpable  Not palpable  PT Not palpable Not palpable  DP Not palpable Not palpable    Gastro- intestinal soft, non-distended, non-tender to palpation, No guarding or rebound, no HSM, no masses, no CVAT B, No palpable prominent aortic pulse,    Musculo- skeletal M/S 5/5 throughout  , Extremities without ischemic changes  , R leg leg warm to calf, L warm to knee  Neurologic Cranial nerves grossly intact , Pain and light touch intact in extremities , Motor exam as listed above  Psychiatric Judgement intact, Mood & affect appropriate for pt's clinical situation  Dermatologic See M/S exam for extremity exam, No rashes otherwise noted  Lymphatic  Palpable lymph nodes: None   Radiology     Ct Abdomen Pelvis W Contrast  Result Date: 08/30/2019 CLINICAL DATA:  Has not urinated in multiple days, acute generalized abdominal pain, nausea, recent stent surgery, prior prostatectomy, cholecystectomy, past history prostate cancer, lung cancer, type II diabetes mellitus, GERD, hypertension, former smoker EXAM: CT ABDOMEN AND PELVIS WITH CONTRAST TECHNIQUE: Multidetector CT imaging of the abdomen and pelvis was performed using the standard protocol following bolus administration of intravenous contrast. Sagittal and coronal MPR images reconstructed from axial data set. CONTRAST:  134mL OMNIPAQUE IOHEXOL 300 MG/ML SOLN IV. Dilute oral contrast. COMPARISON:  08/24/2019 FINDINGS: Lower chest: Bibasilar atelectasis. Partially calcified pleural based mass at anteromedial base of RIGHT middle lobe unchanged. Hepatobiliary: Gallbladder surgically absent. Liver normal appearance. Pancreas: Normal appearance Spleen: New area of low attenuation at the inferior posterior spleen consistent with infarct. Remainder of spleen unremarkable. Adrenals/Urinary Tract: Adrenal glands normal appearance. Small cyst LEFT kidney with additional fat containing lesion at inferior pole consistent with small angiomyolipoma 13 x 10 mm. No LEFT  hydronephrosis. Abnormal decreased enhancement of the anterior half of the RIGHT kidney consistent with either infarct or hypoperfusion, new since prior exam. Adjacent  perinephric stranding. No RIGHT renal mass or hydronephrosis. Stomach/Bowel: Normal appendix. Stomach and bowel loops normal appearance. Vascular/Lymphatic: Extensive atherosclerotic calcifications including aorta, visceral arteries, coronary arteries. Significant calcified plaque at the origins of the renal arteries, mildly at SMA. Aneurysmal dilatation of the mid abdominal aorta 3.6 x 3.0 cm image 46 with interval endoluminal stenting of the aorta and iliac arteries since prior study. Infiltrative changes at the inguinal regions bilaterally likely due to recent endovascular access. New stent at the origin of the RIGHT renal artery. No adenopathy. Reproductive: Prior prostatectomy Other: No free air or free fluid. Small umbilical hernia containing fat. Infiltrative changes from the inguinal regions extend along the LEFT external iliac artery and vein into the pelvis likely related to recent vascular procedure. Small fluid collections surrounding the bifurcations of the common femoral arteries bilaterally. Musculoskeletal: Diffuse osseous demineralization. Advanced degenerative disc and facet disease changes lumbar spine. IMPRESSION: Interval endoluminal stenting of abdominal aortic aneurysm into iliac arteries bilaterally. Impaired nephrogram at anterior half of the RIGHT kidney either representing infarct or hypoperfusion in patient with a new RIGHT renal artery stent. New small splenic infarct. Infiltrative changes at the inguinal regions bilaterally consistent with recent endovascular access, extending along the distal aspect of the LEFT external iliac vessels. Small cyst and small angiomyolipoma of the LEFT kidney. Findings called to Dr. Jari Pigg on 07/12/2019 at 1618 hours. Electronically Signed   By: Lavonia Dana M.D.   On: 08/30/2019 16:19   Dg  Chest Portable 1 View  Result Date: 08/30/2019 CLINICAL DATA:  SOB per ordering notes. Per RN notes- Pt O2 sat dropping to 89-90 on room air, pt put on O2 2L [place]. Hx of HTN and diabetes. EXAM: PORTABLE CHEST 1 VIEW COMPARISON:  Chest radiograph 08/24/2019, 07/26/2018; CT chest 06/14/2019 FINDINGS: Stable cardiomediastinal contours with mildly enlarged heart size. Left chest dual pacer unchanged in position. Central venous congestion. Low volume study with bronchovascular crowding. A few scattered linear opacities at the left base likely atelectasis or scarring. No new focal infiltrate. No pneumothorax or large pleural effusion. No acute finding in the visualized skeleton. IMPRESSION: Central venous congestion without overt edema. Left basilar scarring or atelectasis. Electronically Signed   By: Audie Pinto M.D.   On: 08/30/2019 16:21   Laboratory   CBC CBC Latest Ref Rng & Units 08/30/2019 08/27/2019 08/26/2019  WBC 4.0 - 10.5 K/uL 8.1 9.2 7.2  Hemoglobin 13.0 - 17.0 g/dL 12.4(L) 11.3(L) 11.6(L)  Hematocrit 39.0 - 52.0 % 37.8(L) 34.7(L) 35.7(L)  Platelets 150 - 400 K/uL 271 201 197    BMP BMP Latest Ref Rng & Units 08/30/2019 08/27/2019 08/26/2019  Glucose 70 - 99 mg/dL 171(H) 158(H) 173(H)  BUN 8 - 23 mg/dL 32(H) 20 16  Creatinine 0.61 - 1.24 mg/dL 1.32(H) 1.05 0.88  BUN/Creat Ratio 10 - 24 - - -  Sodium 135 - 145 mmol/L 130(L) 136 139  Potassium 3.5 - 5.1 mmol/L 4.6 4.0 4.8  Chloride 98 - 111 mmol/L 95(L) 102 107  CO2 22 - 32 mmol/L 21(L) 23 24  Calcium 8.9 - 10.3 mg/dL 9.3 9.0 8.8(L)    Coagulation Lab Results  Component Value Date   INR 1.1 08/25/2019   INR 1.1 08/23/2019   INR 1.1 08/22/2019   No results found for: PTT  Lipids    Component Value Date/Time   CHOL 138 08/25/2018 1053   TRIG 152 (H) 08/25/2018 1053   HDL 46 08/25/2018 1053   CHOLHDL 3.0 08/25/2018 1053   CHOLHDL  2.8 09/30/2017 1003   VLDL 23 11/30/2010 0506   LDLCALC 62 08/25/2018 1053   LDLCALC 63 09/30/2017  1003     Medical Decision Making   ZYRON DEELEY is a 85 y.o. male who presents with: possible right renal malperfusion and splenic infarct   Interval onset of abdominal sx is suspicious for possible thromboembolic phenomena  If pt had complications from his procedure, I would have expected sx immediately post-procedure  Admit for further work-up and rehydration  IVF: NS@125  cc/hr  All ARF/ARB/diuretics on hold  Heparin drip while work-up in progress  TTE tomorrow  Hold on Chest CTA while watching Cr trend  Thank you for allowing [place] to participate in this patient's care.   Adele Barthel, MD, FACS, FSVS Covering for [facility]  08/30/2019, 6:21 PM

## 2019-08-30 NOTE — ED Triage Notes (Addendum)
Pt to ED with reports that he has not urinated in multiple days. Pt denies pain at this time and denies having had this problem in the past. Pt also reporting nausea and abd pain.

## 2019-08-30 NOTE — ED Notes (Signed)
Bladder scan completed, 200 cc urine noted via bladder scan.

## 2019-08-30 NOTE — ED Notes (Signed)
ED TO INPATIENT HANDOFF REPORT  ED Nurse Name and Phone #: Gwenette Greet [phone]  G Name/Age/Gender Howard [name] 85 y.o. male Room/Bed: ED18A/ED18A  Code Status   Code Status: Prior  Home/SNF/Other Home Patient oriented to: self, place, time and situation Is this baseline? Yes   Triage Complete: Triage complete  Chief Complaint sent by dr/unable to urinate  Triage Note Pt to ED with reports that he has not urinated in multiple days. Pt denies pain at this time and denies having had this problem in the past. Pt also reporting nausea and abd pain.    Allergies Allergies  Allergen Reactions  . Sulfa Antibiotics Rash    Level of Care/Admitting Diagnosis ED Disposition    ED Disposition Condition Comment   Admit  The patient appears reasonably stabilized for admission considering the current resources, flow, and capabilities available in the ED at this time, and I doubt any other [facility] requiring further screening and/or treatment in the ED prior to admission is  present.       B Medical/Surgery History Past Medical History:  Diagnosis Date  . Arthritis   . Bell palsy   . Cancer [facility])    prostate and skin  . Chronic combined systolic and diastolic CHF, NYHA class 1 ([place])    a. 07/2014 Echo: EF 35-40%, Gr 1 DD.  Marland Kitchen Complete heart block ([place])    a. 11/2010 s/p SJM 2210 Accent DC PPM, ser# [id number].  . Depression   . Diabetes mellitus without complication ([place])   . Fall 11-10-14  . GERD (gastroesophageal reflux disease)   . History of prostate cancer   . Hyperlipidemia   . Hypertension   . LBBB (left bundle branch block)   . Left-sided Bell's palsy   . Lung cancer ([place]) 2016  . NICM (nonischemic cardiomyopathy) ([place])    a. 07/2014 Echo: EF 35-40%, mid-apicalanteroseptal DK, Gr 1 DD, mild-mod dil LA.  . Non-obstructive CAD    a. 07/2014 Abnl MV;  b. 08/2014 Cath: LM nl, LAD 30p, RI 40p, LCX nl, OM1 40, RCA dominant 30p, 70d-->Med Rx.  Marland Kitchen Poor balance   . Presence of permanent  cardiac pacemaker   . Sleep apnea    a. cpap  . Vertigo   . WPW (Wolff-Parkinson-White syndrome)    a. S/P RFCA 1991.   Past Surgical History:  Procedure Laterality Date  . ABDOMINAL AORTIC ENDOVASCULAR STENT GRAFT  08/25/2019   Procedure: ABDOMINAL AORTIC ENDOVASCULAR STENT GRAFT;  Surgeon: Algernon Huxley, MD;  Location: [facility] ORS;  Service: Vascular;;  . ANGIOPLASTY Left 08/25/2019   Procedure: ANGIOPLASTY;  Surgeon: Algernon Huxley, MD;  Location: [facility] ORS;  Service: Vascular;  Laterality: Left;  left SFA and stent placement  . APPLICATION OF WOUND VAC Left 06/07/2015   Procedure: APPLICATION OF WOUND VAC;  Surgeon: Robert Bellow, MD;  Location: [facility] ORS;  Service: General;  Laterality: Left;  left upper back  . BACK SURGERY     2011  . CARDIAC CATHETERIZATION  08/26/2014   Single vessel obstructive CAD  . CARPAL TUNNEL RELEASE  04-04-15   [facility]  . CATARACT EXTRACTION  07-31-11 and 09-18-11  . Catheter ablation  1991   for WPW  . cervical fusion    . CHOLECYSTECTOMY  09-07-14  . ENDARTERECTOMY FEMORAL Left 08/25/2019   Procedure: ENDARTERECTOMY FEMORAL;  Surgeon: Algernon Huxley, MD;  Location: [facility] ORS;  Service: Vascular;  Laterality: Left;  common and produndis   . ENDOVASCULAR REPAIR/STENT  GRAFT Right 08/25/2019   Procedure: ENDOVASCULAR REPAIR/STENT GRAFT;  Surgeon: Algernon Huxley, MD;  Location: [facility] ORS;  Service: Vascular;  Laterality: Right;  renal artery  . HAND SURGERY     right 1993; left 2005  . HERNIA REPAIR  1955  . INSERT / REPLACE / REMOVE PACEMAKER    . INSERTION OF ILIAC STENT Bilateral 08/25/2019   Procedure: INSERTION OF ILIAC STENT;  Surgeon: Algernon Huxley, MD;  Location: [facility] ORS;  Service: Vascular;  Laterality: Bilateral;  . JOINT REPLACEMENT Left 2013   knee  . JOINT REPLACEMENT Right 2004   knee  . KNEE SURGERY     left knee 1991 and 1992; right knee 1995  . LEFT HEART CATHETERIZATION WITH CORONARY ANGIOGRAM N/A 08/26/2014   Procedure: LEFT HEART CATHETERIZATION WITH  CORONARY ANGIOGRAM;  Surgeon: [name], MD;  Location[facility] CATH LAB;  Service: Cardiovascular;  Laterality: N/A;  . LOWER EXTREMITY ANGIOGRAPHY Left 08/23/2019   Procedure: Lower Extremity Angiography;  Surgeon: Algernon Huxley, MD;  Location: [place] CV LAB;  Service: Cardiovascular;  Laterality: Left;  . LUMBAR LAMINECTOMY/DECOMPRESSION MICRODISCECTOMY N/A 06/07/2014   Procedure: LUMBAR FOUR TO FIVE LUMBAR LAMINECTOMY/DECOMPRESSION MICRODISCECTOMY 1 LEVEL;  Surgeon: [name], MD;  Location: [place] NEURO ORS;  Service: Neurosurgery;  Laterality: N/A;  . LUNG BIOPSY Right 2016   Dr [name]  . PACEMAKER INSERTION     PPM-- St Jude 11/30/10 by [name]  . PPM GENERATOR CHANGEOUT N/A 07/09/2019   Procedure: PPM GENERATOR CHANGEOUT;  Surgeon: Deboraha Sprang, MD;  Location: [place] CV LAB;  Service: Cardiovascular;  Laterality: N/A;  . PROSTATE SURGERY     cancer--1998, prostatectomy  . REPLACEMENT TOTAL KNEE     2004  . ruptured disc     1962 and 1998  . TEMPORARY PACEMAKER N/A 07/09/2019   Procedure: TEMPORARY PACEMAKER;  Surgeon: Deboraha Sprang, MD;  Location: [place] CV LAB;  Service: Cardiovascular;  Laterality: N/A;  . TRIGGER FINGER RELEASE  01-24-15  . WOUND DEBRIDEMENT Left 06/07/2015   Procedure: DEBRIDEMENT WOUND;  Surgeon: Robert Bellow, MD;  Location: [facility] ORS;  Service: General;  Laterality: Left;  left upper back     A IV Location/Drains/Wounds Patient Lines/Drains/Airways Status   Active Line/Drains/Airways    Name:   Placement date:   Placement time:   Site:   Days:   Peripheral IV 08/30/19 Left Hand   08/30/19    1229    Hand   less than 1   Incision (Closed) 08/25/19 Groin Right   08/25/19    1710     5   Incision (Closed) 08/25/19 Groin Other (Comment);Left   08/25/19    1710     5          Intake/Output Last 24 hours  Intake/Output Summary (Last 24 hours) at 08/30/2019 1725 Last data filed at 08/30/2019 1717 Gross per 24 hour  Intake 1000 ml  Output -  Net  1000 ml    Labs/Imaging Results for orders placed or performed during the hospital encounter of 08/30/19 (from the past 48 hour(s))  CBC     Status: Abnormal   Collection Time: 08/30/19 12:08 PM  Result Value Ref Range   WBC 8.1 4.0 - 10.5 K/uL   RBC 4.65 4.22 - 5.81 MIL/uL   Hemoglobin 12.4 (L) 13.0 - 17.0 g/dL   HCT 37.8 (L) 39.0 - 52.0 %   MCV 81.3 80.0 - 100.0 fL  MCH 26.7 26.0 - 34.0 pg   MCHC 32.8 30.0 - 36.0 g/dL   RDW 13.9 11.5 - 15.5 %   Platelets 271 150 - 400 K/uL   nRBC 0.0 0.0 - 0.2 %    Comment: Performed at [facility], [address] [place], [place] [address]  Basic metabolic panel     Status: Abnormal   Collection Time: 08/30/19 12:08 PM  Result Value Ref Range   Sodium 130 (L) 135 - 145 mmol/L   Potassium 4.6 3.5 - 5.1 mmol/L   Chloride 95 (L) 98 - 111 mmol/L   CO2 21 (L) 22 - 32 mmol/L   Glucose, Bld 171 (H) 70 - 99 mg/dL   BUN 32 (H) 8 - 23 mg/dL   Creatinine, Ser 1.32 (H) 0.61 - 1.24 mg/dL   Calcium 9.3 8.9 - 10.3 mg/dL   GFR calc non Af Amer 49 (L) >60 mL/min   GFR calc Af Amer 56 (L) >60 mL/min   Anion gap 14 5 - 15    Comment: Performed at [facility], [address], [place], [place] [address]  Magnesium     Status: None   Collection Time: 08/30/19 12:08 PM  Result Value Ref Range   Magnesium 2.0 1.7 - 2.4 mg/dL    Comment: Performed at [facility], [address] [place], [place] [address]   Ct Abdomen Pelvis W Contrast  Result Date: 08/30/2019 CLINICAL DATA:  Has not urinated in multiple days, acute generalized abdominal pain, nausea, recent stent surgery, prior prostatectomy, cholecystectomy, past history prostate cancer, lung cancer, type II diabetes mellitus, GERD, hypertension, former smoker EXAM: CT ABDOMEN AND PELVIS WITH CONTRAST TECHNIQUE: Multidetector CT imaging of the abdomen and pelvis was performed using the standard protocol following bolus administration of intravenous contrast. Sagittal and  coronal MPR images reconstructed from axial data set. CONTRAST:  124mL OMNIPAQUE IOHEXOL 300 MG/ML SOLN IV. Dilute oral contrast. COMPARISON:  08/24/2019 FINDINGS: Lower chest: Bibasilar atelectasis. Partially calcified pleural based mass at anteromedial base of RIGHT middle lobe unchanged. Hepatobiliary: Gallbladder surgically absent. Liver normal appearance. Pancreas: Normal appearance Spleen: New area of low attenuation at the inferior posterior spleen consistent with infarct. Remainder of spleen unremarkable. Adrenals/Urinary Tract: Adrenal glands normal appearance. Small cyst LEFT kidney with additional fat containing lesion at inferior pole consistent with small angiomyolipoma 13 x 10 mm. No LEFT hydronephrosis. Abnormal decreased enhancement of the anterior half of the RIGHT kidney consistent with either infarct or hypoperfusion, new since prior exam. Adjacent perinephric stranding. No RIGHT renal mass or hydronephrosis. Stomach/Bowel: Normal appendix. Stomach and bowel loops normal appearance. Vascular/Lymphatic: Extensive atherosclerotic calcifications including aorta, visceral arteries, coronary arteries. Significant calcified plaque at the origins of the renal arteries, mildly at SMA. Aneurysmal dilatation of the mid abdominal aorta 3.6 x 3.0 cm image 46 with interval endoluminal stenting of the aorta and iliac arteries since prior study. Infiltrative changes at the inguinal regions bilaterally likely due to recent endovascular access. New stent at the origin of the RIGHT renal artery. No adenopathy. Reproductive: Prior prostatectomy Other: No free air or free fluid. Small umbilical hernia containing fat. Infiltrative changes from the inguinal regions extend along the LEFT external iliac artery and vein into the pelvis likely related to recent vascular procedure. Small fluid collections surrounding the bifurcations of the common femoral arteries bilaterally. Musculoskeletal: Diffuse osseous  demineralization. Advanced degenerative disc and facet disease changes lumbar spine. IMPRESSION: Interval endoluminal stenting of abdominal aortic aneurysm into iliac arteries  bilaterally. Impaired nephrogram at anterior half of the RIGHT kidney either representing infarct or hypoperfusion in patient with a new RIGHT renal artery stent. New small splenic infarct. Infiltrative changes at the inguinal regions bilaterally consistent with recent endovascular access, extending along the distal aspect of the LEFT external iliac vessels. Small cyst and small angiomyolipoma of the LEFT kidney. Findings called to Dr. Jari Pigg on 07/12/2019 at 1618 hours. Electronically Signed   By: Lavonia Dana M.D.   On: 08/30/2019 16:19   Dg Chest Portable 1 View  Result Date: 08/30/2019 CLINICAL DATA:  SOB per ordering notes. Per RN notes- Pt O2 sat dropping to 89-90 on room air, pt put on O2 2L [place]. Hx of HTN and diabetes. EXAM: PORTABLE CHEST 1 VIEW COMPARISON:  Chest radiograph 08/24/2019, 07/26/2018; CT chest 06/14/2019 FINDINGS: Stable cardiomediastinal contours with mildly enlarged heart size. Left chest dual pacer unchanged in position. Central venous congestion. Low volume study with bronchovascular crowding. A few scattered linear opacities at the left base likely atelectasis or scarring. No new focal infiltrate. No pneumothorax or large pleural effusion. No acute finding in the visualized skeleton. IMPRESSION: Central venous congestion without overt edema. Left basilar scarring or atelectasis. Electronically Signed   By: Audie Pinto M.D.   On: 08/30/2019 16:21    Pending Labs Unresulted Labs (From admission, onward)    Start     Ordered   08/30/19 1651  SARS Coronavirus 2 [facility] order, Performed in [facility] hospital lab) Nasopharyngeal Nasopharyngeal Swab  (Symptomatic/High Risk of Exposure/Tier 1 Patients Labs with Precautions)  Once,   STAT    Question Answer Comment  Is this test for diagnosis or screening  Diagnosis of ill patient   Symptomatic for COVID-19 as defined by CDC Yes   Date of Symptom Onset 08/30/2019   Hospitalized for COVID-19 Yes   Admitted to ICU for COVID-19 No   Previously tested for COVID-19 Yes   Resident in a congregate (group) care setting No   Employed in healthcare setting No      08/30/19 1650   08/30/19 1119  Urinalysis, Complete w Microscopic  Once,   STAT     08/30/19 1118          Vitals/Pain Today's Vitals   08/30/19 1530 08/30/19 1600 08/30/19 1601 08/30/19 1700  BP: (!) 149/76 (!) 154/68  (!) 155/83  Pulse: (!) 104 91  (!) 102  Resp: (!) 23 17  20   Temp:      TempSrc:      SpO2: (!) 87% 100%  100%  Weight:      Height:      PainSc:   0-No pain     Isolation Precautions No active isolations  Medications Medications  iohexol (OMNIPAQUE) 9 MG/ML oral solution 500 mL (500 mLs Oral Contrast Given 08/30/19 1402)  ondansetron (ZOFRAN) injection 4 mg (4 mg Intravenous Given 08/30/19 1349)  sodium chloride 0.9 % bolus 1,000 mL (0 mLs Intravenous Stopped 08/30/19 1717)  iohexol (OMNIPAQUE) 300 MG/ML solution 100 mL (100 mLs Intravenous Contrast Given 08/30/19 1537)    Mobility walks with device High fall risk   Focused Assessments see assessments   R Recommendations: See Admitting Provider Note  Report given to:   Additional Notes: Pt has pacemaker. On O2 2 L . Uses CPap at home at night, but not on O2 chronically.

## 2019-08-31 DIAGNOSIS — G4730 Sleep apnea, unspecified: Secondary | ICD-10-CM | POA: Diagnosis present

## 2019-08-31 DIAGNOSIS — D735 Infarction of spleen: Secondary | ICD-10-CM | POA: Diagnosis not present

## 2019-08-31 DIAGNOSIS — I4891 Unspecified atrial fibrillation: Secondary | ICD-10-CM | POA: Diagnosis present

## 2019-08-31 DIAGNOSIS — Z9049 Acquired absence of other specified parts of digestive tract: Secondary | ICD-10-CM | POA: Diagnosis not present

## 2019-08-31 DIAGNOSIS — Z85828 Personal history of other malignant neoplasm of skin: Secondary | ICD-10-CM | POA: Diagnosis not present

## 2019-08-31 DIAGNOSIS — I340 Nonrheumatic mitral (valve) insufficiency: Secondary | ICD-10-CM | POA: Diagnosis not present

## 2019-08-31 DIAGNOSIS — I2510 Atherosclerotic heart disease of native coronary artery without angina pectoris: Secondary | ICD-10-CM | POA: Diagnosis present

## 2019-08-31 DIAGNOSIS — I25118 Atherosclerotic heart disease of native coronary artery with other forms of angina pectoris: Secondary | ICD-10-CM | POA: Diagnosis not present

## 2019-08-31 DIAGNOSIS — Z8679 Personal history of other diseases of the circulatory system: Secondary | ICD-10-CM | POA: Diagnosis not present

## 2019-08-31 DIAGNOSIS — Z950 Presence of cardiac pacemaker: Secondary | ICD-10-CM | POA: Diagnosis not present

## 2019-08-31 DIAGNOSIS — I5042 Chronic combined systolic (congestive) and diastolic (congestive) heart failure: Secondary | ICD-10-CM | POA: Diagnosis present

## 2019-08-31 DIAGNOSIS — I361 Nonrheumatic tricuspid (valve) insufficiency: Secondary | ICD-10-CM | POA: Diagnosis not present

## 2019-08-31 DIAGNOSIS — E1151 Type 2 diabetes mellitus with diabetic peripheral angiopathy without gangrene: Secondary | ICD-10-CM | POA: Diagnosis present

## 2019-08-31 DIAGNOSIS — I442 Atrioventricular block, complete: Secondary | ICD-10-CM | POA: Diagnosis not present

## 2019-08-31 DIAGNOSIS — I371 Nonrheumatic pulmonary valve insufficiency: Secondary | ICD-10-CM | POA: Diagnosis not present

## 2019-08-31 DIAGNOSIS — N179 Acute kidney failure, unspecified: Secondary | ICD-10-CM | POA: Diagnosis present

## 2019-08-31 DIAGNOSIS — Z96653 Presence of artificial knee joint, bilateral: Secondary | ICD-10-CM | POA: Diagnosis present

## 2019-08-31 DIAGNOSIS — K219 Gastro-esophageal reflux disease without esophagitis: Secondary | ICD-10-CM | POA: Diagnosis present

## 2019-08-31 DIAGNOSIS — Z20828 Contact with and (suspected) exposure to other viral communicable diseases: Secondary | ICD-10-CM | POA: Diagnosis present

## 2019-08-31 DIAGNOSIS — Q211 Atrial septal defect: Secondary | ICD-10-CM | POA: Diagnosis not present

## 2019-08-31 DIAGNOSIS — I749 Embolism and thrombosis of unspecified artery: Secondary | ICD-10-CM | POA: Diagnosis not present

## 2019-08-31 DIAGNOSIS — I110 Hypertensive heart disease with heart failure: Secondary | ICD-10-CM | POA: Diagnosis present

## 2019-08-31 DIAGNOSIS — M1990 Unspecified osteoarthritis, unspecified site: Secondary | ICD-10-CM | POA: Diagnosis present

## 2019-08-31 DIAGNOSIS — Z85118 Personal history of other malignant neoplasm of bronchus and lung: Secondary | ICD-10-CM | POA: Diagnosis not present

## 2019-08-31 DIAGNOSIS — Z95820 Peripheral vascular angioplasty status with implants and grafts: Secondary | ICD-10-CM | POA: Diagnosis not present

## 2019-08-31 DIAGNOSIS — I428 Other cardiomyopathies: Secondary | ICD-10-CM | POA: Diagnosis present

## 2019-08-31 DIAGNOSIS — E785 Hyperlipidemia, unspecified: Secondary | ICD-10-CM | POA: Diagnosis present

## 2019-08-31 DIAGNOSIS — I739 Peripheral vascular disease, unspecified: Secondary | ICD-10-CM | POA: Diagnosis not present

## 2019-08-31 DIAGNOSIS — Z981 Arthrodesis status: Secondary | ICD-10-CM | POA: Diagnosis not present

## 2019-08-31 DIAGNOSIS — Z8546 Personal history of malignant neoplasm of prostate: Secondary | ICD-10-CM | POA: Diagnosis not present

## 2019-08-31 LAB — COMPREHENSIVE METABOLIC PANEL
ALT: 17 U/L (ref 0–44)
AST: 22 U/L (ref 15–41)
Albumin: 3.1 g/dL — ABNORMAL LOW (ref 3.5–5.0)
Alkaline Phosphatase: 41 U/L (ref 38–126)
Anion gap: 11 (ref 5–15)
BUN: 24 mg/dL — ABNORMAL HIGH (ref 8–23)
CO2: 20 mmol/L — ABNORMAL LOW (ref 22–32)
Calcium: 8.3 mg/dL — ABNORMAL LOW (ref 8.9–10.3)
Chloride: 99 mmol/L (ref 98–111)
Creatinine, Ser: 1.05 mg/dL (ref 0.61–1.24)
GFR calc Af Amer: >60 mL/min (ref >60)
GFR calc non Af Amer: >60 mL/min (ref >60)
Glucose, Bld: 136 mg/dL — ABNORMAL HIGH (ref 70–99)
Potassium: 3.7 mmol/L (ref 3.5–5.1)
Sodium: 130 mmol/L — ABNORMAL LOW (ref 135–145)
Total Bilirubin: 0.7 mg/dL (ref 0.3–1.2)
Total Protein: 6.1 g/dL — ABNORMAL LOW (ref 6.5–8.1)

## 2019-08-31 LAB — HEPARIN LEVEL (UNFRACTIONATED)
Heparin Unfractionated: 0.71 IU/mL — ABNORMAL HIGH (ref 0.30–0.70)
Heparin Unfractionated: 0.45 IU/mL (ref 0.30–0.70)

## 2019-08-31 LAB — CBC
HCT: 31.2 % — ABNORMAL LOW (ref 39.0–52.0)
Hemoglobin: 10.4 g/dL — ABNORMAL LOW (ref 13.0–17.0)
MCH: 26.8 pg (ref 26.0–34.0)
MCHC: 33.3 g/dL (ref 30.0–36.0)
MCV: 80.4 fL (ref 80.0–100.0)
Platelets: 266 10*3/uL (ref 150–400)
RBC: 3.88 MIL/uL — ABNORMAL LOW (ref 4.22–5.81)
RDW: 13.8 % (ref 11.5–15.5)
WBC: 5.6 10*3/uL (ref 4.0–10.5)
nRBC: 0 % (ref 0.0–0.2)

## 2019-08-31 MED ORDER — SODIUM CHLORIDE 0.9% FLUSH
10.0000 mL | Freq: Two times a day (BID) | INTRAVENOUS | Status: DC
  Administered 2019-08-31 – 2019-09-02 (×4): 10 mL via INTRAVENOUS

## 2019-08-31 NOTE — Progress Notes (Signed)
[facility]  Daily Progress Note   Subjective  - * No surgery found *  Patient says he is feeling a little better today.  Less abdominal pain.  Eating his breakfast although he does not have a great appetite  Objective Vitals:   08/30/19 1930 08/30/19 2020 08/31/19 0408 08/31/19 0745  BP: 133/70 (!) 139/57 132/67 131/67  Pulse: 85 97 83 81  Resp: 19 18 17 19   Temp:  98.9 F (37.2 C) 98.3 F (36.8 C) 98 F (36.7 C)  TempSrc:  Oral Oral Oral  SpO2: 99% 94% 94% 94%  Weight:  83 kg    Height:  5\' 9"  (1.753 m)      Intake/Output Summary (Last 24 hours) at 08/31/2019 1125 Last data filed at 08/31/2019 1059 Gross per 24 hour  Intake 1240 ml  Output 925 ml  Net 315 ml    PULM  CTAB CV  RRR VASC  feet warm with good capillary refill and soft pulses palpable.  Laboratory CBC    Component Value Date/Time   WBC 5.6 08/31/2019 0457   HGB 10.4 (L) 08/31/2019 0457   HGB 14.0 07/01/2019 0924   HCT 31.2 (L) 08/31/2019 0457   HCT 43.7 07/01/2019 0924   PLT 266 08/31/2019 0457   PLT 297 07/01/2019 0924    BMET    Component Value Date/Time   NA 130 (L) 08/31/2019 0457   NA 134 07/01/2019 0930   NA 137 11/10/2014 2041   K 3.7 08/31/2019 0457   K 3.9 11/10/2014 2041   CL 99 08/31/2019 0457   CL 104 11/10/2014 2041   CO2 20 (L) 08/31/2019 0457   CO2 24 11/10/2014 2041   GLUCOSE 136 (H) 08/31/2019 0457   GLUCOSE 154 (H) 11/10/2014 2041   BUN 24 (H) 08/31/2019 0457   BUN 28 (H) 07/01/2019 0930   BUN 13 11/10/2014 2041   CREATININE 1.05 08/31/2019 0457   CREATININE 0.81 09/30/2017 1003   CALCIUM 8.3 (L) 08/31/2019 0457   CALCIUM 8.5 11/10/2014 2041   GFRNONAA >60 08/31/2019 0457   GFRNONAA 82 09/30/2017 1003   GFRAA >60 08/31/2019 0457   GFRAA 95 09/30/2017 1003    Assessment/Planning: POD #6 s/p AAA stent graft repair, right renal stent, bilateral iliac stents, left femoral endarterectomy, and left SFA stent   Would suspect the infarcts are  from a cardiac source given the timing several days after the procedure and the location.  The splenic infarct would not be expected with the procedures.  Difficult to discern with my interpretation of the CT due to stent artifact but the majority of the flow to the right kidney is maintained, the right renal that was stented was essentially occluded at the time of the procedure, and his renal function remains normal.  Continue heparin for now and will await the echo results.    Leotis Pain  08/31/2019, 11:25 AM

## 2019-08-31 NOTE — Plan of Care (Signed)
  Problem: Activity: Goal: Ability to return to baseline activity level will improve Outcome: Progressing   Problem: Cardiovascular: Goal: Ability to achieve and maintain adequate cardiovascular perfusion will improve Outcome: Progressing Goal: Vascular access site(s) Level 0-1 will be maintained Outcome: Progressing   

## 2019-08-31 NOTE — Consult Note (Signed)
ANTICOAGULATION CONSULT NOTE - Initial Consult  Pharmacy Consult for Heparin Indication: splenic and renal infarct   Allergies  Allergen Reactions  . Sulfa Antibiotics Rash    Patient Measurements: Height: 5\' 9"  (175.3 cm) Weight: 182 lb 14.4 oz (83 kg) IBW/kg (Calculated) : 70.7 Heparin Dosing Weight: 83 kg  Vital Signs: Temp: 98 F (36.7 C) (09/08 0745) Temp Source: Oral (09/08 0745) BP: 131/67 (09/08 0745) Pulse Rate: 81 (09/08 0745)  Labs: Recent Labs    08/30/19 1208 08/30/19 2029 08/31/19 0457 08/31/19 0803  HGB 12.4*  --  10.4*  --   HCT 37.8*  --  31.2*  --   PLT 271  --  266  --   APTT  --  41*  --   --   LABPROT  --  14.1  --   --   INR  --  1.1  --   --   HEPARINUNFRC  --   --   --  0.71*  CREATININE 1.32*  --  1.05  --     Estimated Creatinine Clearance: 50.5 mL/min (by C-G formula based on SCr of 1.05 mg/dL).   Medical History: Past Medical History:  Diagnosis Date  . Arthritis   . Bell palsy   . Cancer [facility])    prostate and skin  . Chronic combined systolic and diastolic CHF, NYHA class 1 ([place])    a. 07/2014 Echo: EF 35-40%, Gr 1 DD.  Marland Kitchen Complete heart block ([place])    a. 11/2010 s/p SJM 2210 Accent DC PPM, ser# [id number].  . Depression   . Diabetes mellitus without complication ([place])   . Fall 11-10-14  . GERD (gastroesophageal reflux disease)   . History of prostate cancer   . Hyperlipidemia   . Hypertension   . LBBB (left bundle branch block)   . Left-sided Bell's palsy   . Lung cancer ([place]) 2016  . NICM (nonischemic cardiomyopathy) ([place])    a. 07/2014 Echo: EF 35-40%, mid-apicalanteroseptal DK, Gr 1 DD, mild-mod dil LA.  . Non-obstructive CAD    a. 07/2014 Abnl MV;  b. 08/2014 Cath: LM nl, LAD 30p, RI 40p, LCX nl, OM1 40, RCA dominant 30p, 70d-->Med Rx.  Marland Kitchen Poor balance   . Presence of permanent cardiac pacemaker   . Sleep apnea    a. cpap  . Vertigo   . WPW (Wolff-Parkinson-White syndrome)    a. S/P RFCA 1991.    Medications:   Medications Prior to Admission  Medication Sig Dispense Refill Last Dose  . amoxicillin-clavulanate (AUGMENTIN) 875-125 MG tablet Take 1 tablet by mouth 2 (two) times daily for 6 doses. 6 tablet 0 08/29/2019 at 2100  . aspirin EC 81 MG tablet Take 81 mg by mouth daily.   08/29/2019 at 0900  . Cholecalciferol (VITAMIN D3) 1000 UNITS CAPS Take 1,000 Units by mouth daily.    08/29/2019 at 0900  . clopidogrel (PLAVIX) 75 MG tablet Take 1 tablet (75 mg total) by mouth daily. 30 tablet 0 08/29/2019 at 0900  . fentaNYL (DURAGESIC) 12 MCG/HR Place 1 patch onto the skin every 3 (three) days. 10 patch 0 08/29/2019 at 2100  . isosorbide mononitrate (IMDUR) 30 MG 24 hr tablet TAKE 1 TABLET BY MOUTH DAILY (Patient taking differently: Take 30 mg by mouth daily. ) 90 tablet 1 08/29/2019 at 0900  . metFORMIN (GLUCOPHAGE) 500 MG tablet Take 1 tablet (500 mg total) by mouth 2 (two) times daily with a meal. 180 tablet 3 08/29/2019 at 2100  .  metoprolol succinate (TOPROL-XL) 25 MG 24 hr tablet Take 1 tablet (25 mg) by mouth once daily at bedtime (Patient taking differently: Take 25 mg by mouth at bedtime. ) 90 tablet 3 08/29/2019 at 2100  . montelukast (SINGULAIR) 10 MG tablet Take 10 mg by mouth daily as needed (sneezing/allergies.).    08/29/2019 at prn  . Multiple Vitamin (MULTIVITAMIN WITH MINERALS) TABS tablet Take 1 tablet by mouth daily. One-A-Day Multivitamin   08/29/2019 at 0900  . omeprazole (PRILOSEC) 40 MG capsule Take 1 capsule (40 mg total) by mouth 2 (two) times daily as needed. (Patient taking differently: Take 40 mg by mouth 2 (two) times a day. ) 180 capsule 3 08/29/2019 at 2100  . sacubitril-valsartan (ENTRESTO) 24-26 MG Take 1 tablet by mouth 2 (two) times daily. 60 tablet 0 08/29/2019 at 2100  . simvastatin (ZOCOR) 40 MG tablet TAKE 1 TABLET AT BEDTIME (Patient taking differently: Take 40 mg by mouth at bedtime. ) 90 tablet 0 08/29/2019 at 2100  . spironolactone (ALDACTONE) 50 MG tablet Take 1 tablet (50 mg) by mouth once  daily in the morning (Patient taking differently: Take 50 mg by mouth every morning. ) 90 tablet 3 08/29/2019 at 0900  . fluticasone (FLONASE) 50 MCG/ACT nasal spray Place 2 sprays into both nostrils daily.   prn at prn  . mupirocin ointment (BACTROBAN) 2 % Apply to affected area 3 times daily (Patient not taking: Reported on 08/30/2019) 22 g 0 Completed Course at Unknown time   Scheduled:  . amoxicillin-clavulanate  1 tablet Oral BID  . aspirin EC  81 mg Oral Daily  . cholecalciferol  1,000 Units Oral Daily  . clopidogrel  75 mg Oral Daily  . [START ON 09/01/2019] fentaNYL  1 patch Transdermal Q72H  . isosorbide mononitrate  30 mg Oral Daily  . metFORMIN  500 mg Oral BID WC  . metoprolol succinate  25 mg Oral QHS  . multivitamin with minerals  1 tablet Oral Daily  . pantoprazole  40 mg Oral Daily  . senna  1 tablet Oral BID  . simvastatin  40 mg Oral QHS   Infusions:  . sodium chloride 125 mL/hr at 08/30/19 2244  . heparin 1,600 Units/hr (08/31/19 1032)   PRN: acetaminophen **OR** acetaminophen, alum & mag hydroxide-simeth, bisacodyl, fluticasone, hydrALAZINE, labetalol, metoprolol tartrate, montelukast, ondansetron, oxyCODONE, polyethylene glycol, sodium phosphate Anti-infectives (From admission, onward)   Start     Dose/Rate Route Frequency Ordered Stop   08/30/19 2200  amoxicillin-clavulanate (AUGMENTIN) 875-125 MG per tablet 1 tablet     1 tablet Oral 2 times daily 08/30/19 1821        Assessment: Pharmacy consulted to start heparin for possible thromboembolism.   9/8 0803 HL 0.71 decrease rate to 1500 units/hr.   Goal of Therapy:  Heparin level 0.3-0.7 units/ml Monitor platelets by anticoagulation protocol: Yes   Plan:  Patient received heparin 6000 units bolus followed by a heparin infusion of 1600 units/hr. Heparin level is slightly supratherapeutic. Will decrease rate to 1500 units/hr.  Check anti-Xa level in 8 hours and daily while on heparin Continue to monitor CBC  daily.  [name], PharmD, BCPS.  08/31/2019,11:03 AM

## 2019-08-31 NOTE — Progress Notes (Signed)
ANTICOAGULATION CONSULT NOTE - Initial Consult  Pharmacy Consult for Heparin  Indication: renal and splenic infarct  Allergies  Allergen Reactions  . Sulfa Antibiotics Rash    Patient Measurements: Height: 5\' 9"  (175.3 cm) Weight: 182 lb 14.4 oz (83 kg) IBW/kg (Calculated) : 70.7 Heparin Dosing Weight: 83 kg   Vital Signs: Temp: 99.2 F (37.3 C) (09/08 1920) Temp Source: Oral (09/08 1920) BP: 123/61 (09/08 1920) Pulse Rate: 86 (09/08 1920)  Labs: Recent Labs    08/30/19 1208 08/30/19 2029 08/31/19 0457 08/31/19 0803 08/31/19 2024  HGB 12.4*  --  10.4*  --   --   HCT 37.8*  --  31.2*  --   --   PLT 271  --  266  --   --   APTT  --  41*  --   --   --   LABPROT  --  14.1  --   --   --   INR  --  1.1  --   --   --   HEPARINUNFRC  --   --   --  0.71* 0.45  CREATININE 1.32*  --  1.05  --   --     Estimated Creatinine Clearance: 50.5 mL/min (by C-G formula based on SCr of 1.05 mg/dL).   Medical History: Past Medical History:  Diagnosis Date  . Arthritis   . Bell palsy   . Cancer [facility])    prostate and skin  . Chronic combined systolic and diastolic CHF, NYHA class 1 ([place])    a. 07/2014 Echo: EF 35-40%, Gr 1 DD.  Marland Kitchen Complete heart block ([place])    a. 11/2010 s/p SJM 2210 [employer] DC PPM, ser# [id number].  . Depression   . Diabetes mellitus without complication ([place])   . Fall 11-10-14  . GERD (gastroesophageal reflux disease)   . History of prostate cancer   . Hyperlipidemia   . Hypertension   . LBBB (left bundle branch block)   . Left-sided Bell's palsy   . Lung cancer ([place]) 2016  . NICM (nonischemic cardiomyopathy) ([place])    a. 07/2014 Echo: EF 35-40%, mid-apicalanteroseptal DK, Gr 1 DD, mild-mod dil LA.  . Non-obstructive CAD    a. 07/2014 Abnl MV;  b. 08/2014 Cath: LM nl, LAD 30p, RI 40p, LCX nl, OM1 40, RCA dominant 30p, 70d-->Med Rx.  Marland Kitchen Poor balance   . Presence of permanent cardiac pacemaker   . Sleep apnea    a. cpap  . Vertigo   . WPW (Wolff-Parkinson-White  syndrome)    a. S/P RFCA 1991.    Medications:  Medications Prior to Admission  Medication Sig Dispense Refill Last Dose  . amoxicillin-clavulanate (AUGMENTIN) 875-125 MG tablet Take 1 tablet by mouth 2 (two) times daily for 6 doses. 6 tablet 0 08/29/2019 at 2100  . aspirin EC 81 MG tablet Take 81 mg by mouth daily.   08/29/2019 at 0900  . Cholecalciferol (VITAMIN D3) 1000 UNITS CAPS Take 1,000 Units by mouth daily.    08/29/2019 at 0900  . clopidogrel (PLAVIX) 75 MG tablet Take 1 tablet (75 mg total) by mouth daily. 30 tablet 0 08/29/2019 at 0900  . fentaNYL (DURAGESIC) 12 MCG/HR Place 1 patch onto the skin every 3 (three) days. 10 patch 0 08/29/2019 at 2100  . isosorbide mononitrate (IMDUR) 30 MG 24 hr tablet TAKE 1 TABLET BY MOUTH DAILY (Patient taking differently: Take 30 mg by mouth daily. ) 90 tablet 1 08/29/2019 at 0900  . metFORMIN (  GLUCOPHAGE) 500 MG tablet Take 1 tablet (500 mg total) by mouth 2 (two) times daily with a meal. 180 tablet 3 08/29/2019 at 2100  . metoprolol succinate (TOPROL-XL) 25 MG 24 hr tablet Take 1 tablet (25 mg) by mouth once daily at bedtime (Patient taking differently: Take 25 mg by mouth at bedtime. ) 90 tablet 3 08/29/2019 at 2100  . montelukast (SINGULAIR) 10 MG tablet Take 10 mg by mouth daily as needed (sneezing/allergies.).    08/29/2019 at prn  . Multiple Vitamin (MULTIVITAMIN WITH MINERALS) TABS tablet Take 1 tablet by mouth daily. One-A-Day Multivitamin   08/29/2019 at 0900  . omeprazole (PRILOSEC) 40 MG capsule Take 1 capsule (40 mg total) by mouth 2 (two) times daily as needed. (Patient taking differently: Take 40 mg by mouth 2 (two) times a day. ) 180 capsule 3 08/29/2019 at 2100  . sacubitril-valsartan (ENTRESTO) 24-26 MG Take 1 tablet by mouth 2 (two) times daily. 60 tablet 0 08/29/2019 at 2100  . simvastatin (ZOCOR) 40 MG tablet TAKE 1 TABLET AT BEDTIME (Patient taking differently: Take 40 mg by mouth at bedtime. ) 90 tablet 0 08/29/2019 at 2100  . spironolactone (ALDACTONE)  50 MG tablet Take 1 tablet (50 mg) by mouth once daily in the morning (Patient taking differently: Take 50 mg by mouth every morning. ) 90 tablet 3 08/29/2019 at 0900  . fluticasone (FLONASE) 50 MCG/ACT nasal spray Place 2 sprays into both nostrils daily.   prn at prn  . mupirocin ointment (BACTROBAN) 2 % Apply to affected area 3 times daily (Patient not taking: Reported on 08/30/2019) 22 g 0 Completed Course at Unknown time    Assessment: Pharmacy consulted to start heparin for possible thromboembolism.   9/8 0803 HL 0.71 decrease rate to 1500 units/hr.   Goal of Therapy:  Heparin level 0.3-0.7 units/ml Monitor platelets by anticoagulation protocol: Yes   Plan:  Patient received heparin 6000 units bolus followed by a heparin infusion of 1600 units/hr. Heparin level is slightly supratherapeutic. Will decrease rate to 1500 units/hr.  Check anti-Xa level in 8 hours and daily while on heparin Continue to monitor CBC daily.  09/08 :  HL @ 2024 = 0.45 Will continue this pt on current rate and recheck HL in 8 hrs on 09/09 @ 0500.   Ean Gettel D 08/31/2019,10:05 PM

## 2019-08-31 NOTE — Plan of Care (Signed)
  Problem: Cardiovascular: Goal: Ability to achieve and maintain adequate cardiovascular perfusion will improve Outcome: Progressing   Problem: Elimination: Goal: Will not experience complications related to urinary retention Outcome: Progressing

## 2019-09-01 ENCOUNTER — Encounter: Payer: Self-pay | Admitting: *Deleted

## 2019-09-01 ENCOUNTER — Encounter
Admission: EM | Disposition: A | Discharge status: Home Health | Payer: Self-pay | Source: Home / Self Care | Attending: Vascular Surgery

## 2019-09-01 ENCOUNTER — Inpatient Hospital Stay (HOSPITAL_COMMUNITY)
Admit: 2019-09-01 | Discharge: 2019-09-01 | Disposition: A | Discharge status: Home / Self Care | Payer: Medicare Other | Attending: Physician Assistant | Admitting: Physician Assistant

## 2019-09-01 DIAGNOSIS — I371 Nonrheumatic pulmonary valve insufficiency: Secondary | ICD-10-CM

## 2019-09-01 DIAGNOSIS — I361 Nonrheumatic tricuspid (valve) insufficiency: Secondary | ICD-10-CM

## 2019-09-01 DIAGNOSIS — D735 Infarction of spleen: Principal | ICD-10-CM

## 2019-09-01 DIAGNOSIS — I739 Peripheral vascular disease, unspecified: Secondary | ICD-10-CM

## 2019-09-01 DIAGNOSIS — I340 Nonrheumatic mitral (valve) insufficiency: Secondary | ICD-10-CM

## 2019-09-01 DIAGNOSIS — I749 Embolism and thrombosis of unspecified artery: Secondary | ICD-10-CM

## 2019-09-01 DIAGNOSIS — I25118 Atherosclerotic heart disease of native coronary artery with other forms of angina pectoris: Secondary | ICD-10-CM

## 2019-09-01 HISTORY — PX: TEE WITHOUT CARDIOVERSION: SHX5443

## 2019-09-01 LAB — BASIC METABOLIC PANEL
Anion gap: 12 (ref 5–15)
BUN: 14 mg/dL (ref 8–23)
CO2: 19 mmol/L — ABNORMAL LOW (ref 22–32)
Calcium: 8.6 mg/dL — ABNORMAL LOW (ref 8.9–10.3)
Chloride: 102 mmol/L (ref 98–111)
Creatinine, Ser: 0.98 mg/dL (ref 0.61–1.24)
GFR calc Af Amer: >60 mL/min (ref >60)
GFR calc non Af Amer: >60 mL/min (ref >60)
Glucose, Bld: 134 mg/dL — ABNORMAL HIGH (ref 70–99)
Potassium: 4.1 mmol/L (ref 3.5–5.1)
Sodium: 133 mmol/L — ABNORMAL LOW (ref 135–145)

## 2019-09-01 LAB — CBC
HCT: 31.5 % — ABNORMAL LOW (ref 39.0–52.0)
Hemoglobin: 10.5 g/dL — ABNORMAL LOW (ref 13.0–17.0)
MCH: 26.6 pg (ref 26.0–34.0)
MCHC: 33.3 g/dL (ref 30.0–36.0)
MCV: 79.9 fL — ABNORMAL LOW (ref 80.0–100.0)
Platelets: 297 10*3/uL (ref 150–400)
RBC: 3.94 MIL/uL — ABNORMAL LOW (ref 4.22–5.81)
RDW: 13.7 % (ref 11.5–15.5)
WBC: 6.2 10*3/uL (ref 4.0–10.5)
nRBC: 0 % (ref 0.0–0.2)

## 2019-09-01 LAB — MAGNESIUM: Magnesium: 1.7 mg/dL (ref 1.7–2.4)

## 2019-09-01 LAB — HEPARIN LEVEL (UNFRACTIONATED): Heparin Unfractionated: 0.41 IU/mL (ref 0.30–0.70)

## 2019-09-01 SURGERY — ECHOCARDIOGRAM, TRANSESOPHAGEAL
Anesthesia: Moderate Sedation

## 2019-09-01 MED ORDER — OXYCODONE HCL 5 MG PO TABS: 5.0000 mg | ORAL_TABLET | Freq: Four times a day (QID) | ORAL | Status: DC | PRN

## 2019-09-01 MED ORDER — FENTANYL CITRATE (PF) 100 MCG/2ML IJ SOLN
INTRAMUSCULAR | Status: AC
  Filled 2019-09-01: qty 2

## 2019-09-01 MED ORDER — LIDOCAINE VISCOUS HCL 2 % MT SOLN
OROMUCOSAL | Status: AC | PRN
  Administered 2019-09-01: 30 mL via OROMUCOSAL

## 2019-09-01 MED ORDER — SODIUM CHLORIDE 0.9 % IV SOLN
INTRAVENOUS | Status: DC
  Administered 2019-09-01: 10:00:00 via INTRAVENOUS

## 2019-09-01 MED ORDER — MAGNESIUM SULFATE 2 GM/50ML IV SOLN
2.0000 g | Freq: Once | INTRAVENOUS | Status: AC
  Administered 2019-09-01: 2 g via INTRAVENOUS
  Filled 2019-09-01: qty 50

## 2019-09-01 MED ORDER — BUTAMBEN-TETRACAINE-BENZOCAINE 2-2-14 % EX AERO
INHALATION_SPRAY | CUTANEOUS | Status: AC
  Filled 2019-09-01: qty 5

## 2019-09-01 MED ORDER — MIDAZOLAM HCL 5 MG/5ML IJ SOLN
INTRAMUSCULAR | Status: AC
  Filled 2019-09-01: qty 5

## 2019-09-01 MED ORDER — FENTANYL CITRATE (PF) 250 MCG/5ML IJ SOLN
INTRAMUSCULAR | Status: AC
  Filled 2019-09-01: qty 5

## 2019-09-01 MED ORDER — LIDOCAINE VISCOUS HCL 2 % MT SOLN
OROMUCOSAL | Status: AC
  Filled 2019-09-01: qty 15

## 2019-09-01 MED ORDER — FENTANYL CITRATE (PF) 100 MCG/2ML IJ SOLN
INTRAMUSCULAR | Status: AC | PRN
  Administered 2019-09-01 (×4): 25 ug via INTRAVENOUS

## 2019-09-01 MED ORDER — SODIUM CHLORIDE 1 G PO TABS
1.0000 g | ORAL_TABLET | Freq: Once | ORAL | Status: AC
  Administered 2019-09-01: 1 g via ORAL
  Filled 2019-09-01: qty 1

## 2019-09-01 MED ORDER — APIXABAN 5 MG PO TABS
5.0000 mg | ORAL_TABLET | Freq: Two times a day (BID) | ORAL | Status: DC
  Administered 2019-09-01 – 2019-09-02 (×3): 5 mg via ORAL
  Filled 2019-09-01 (×3): qty 1

## 2019-09-01 MED ORDER — BUTAMBEN-TETRACAINE-BENZOCAINE 2-2-14 % EX AERO
INHALATION_SPRAY | CUTANEOUS | Status: AC | PRN
  Administered 2019-09-01: 2 via TOPICAL

## 2019-09-01 MED ORDER — MIDAZOLAM HCL 5 MG/5ML IJ SOLN
INTRAMUSCULAR | Status: AC | PRN
  Administered 2019-09-01 (×4): 1 mg via INTRAVENOUS

## 2019-09-01 NOTE — Progress Notes (Signed)
PT Cancellation Note  Patient Details Name: Howard [name] MRN: [id number] DOB: April 02, 1931   Cancelled Treatment:    Reason Eval/Treat Not Completed: Patient at procedure or test/unavailable(Consult received and chart reviewed.  Patient currently off unit for diagnostic procedure. Will re-attempt at later time/date as medically appropriate and available.)   Daryan Cagley H. Owens Shark, PT, DPT, NCS 09/01/19, 3:11 PM [phone]

## 2019-09-01 NOTE — Progress Notes (Signed)
[facility]  Daily Progress Note   Subjective  - * No surgery date entered *  Feels weak today.  Not walking well.  Pain in right thigh likely related to recent surgery and nerve irritation For TEE today to evaluate for source of embolus/infarcts  Objective Vitals:   08/31/19 1508 08/31/19 1920 09/01/19 0424 09/01/19 0736  BP: (!) 105/53 123/61 (!) 143/66 (!) 147/71  Pulse: 88 86 (!) 103 79  Resp: 20 18 18 18   Temp: 97.9 F (36.6 C) 99.2 F (37.3 C) 98.9 F (37.2 C) 98.4 F (36.9 C)  TempSrc: Oral Oral Oral Oral  SpO2: 94% 96% 98% 97%  Weight:   84.6 kg   Height:        Intake/Output Summary (Last 24 hours) at 09/01/2019 1039 Last data filed at 09/01/2019 0946 Gross per 24 hour  Intake 2102.13 ml  Output 1250 ml  Net 852.13 ml    PULM  CTAB CV  RRR VASC  Feet warm, good cap refill.  Mild left leg swelling  Laboratory CBC    Component Value Date/Time   WBC 6.2 09/01/2019 0558   HGB 10.5 (L) 09/01/2019 0558   HGB 14.0 07/01/2019 0924   HCT 31.5 (L) 09/01/2019 0558   HCT 43.7 07/01/2019 0924   PLT 297 09/01/2019 0558   PLT 297 07/01/2019 0924    BMET    Component Value Date/Time   NA 133 (L) 09/01/2019 0558   NA 134 07/01/2019 0930   NA 137 11/10/2014 2041   K 4.1 09/01/2019 0558   K 3.9 11/10/2014 2041   CL 102 09/01/2019 0558   CL 104 11/10/2014 2041   CO2 19 (L) 09/01/2019 0558   CO2 24 11/10/2014 2041   GLUCOSE 134 (H) 09/01/2019 0558   GLUCOSE 154 (H) 11/10/2014 2041   BUN 14 09/01/2019 0558   BUN 28 (H) 07/01/2019 0930   BUN 13 11/10/2014 2041   CREATININE 0.98 09/01/2019 0558   CREATININE 0.81 09/30/2017 1003   CALCIUM 8.6 (L) 09/01/2019 0558   CALCIUM 8.5 11/10/2014 2041   GFRNONAA >60 09/01/2019 0558   GFRNONAA 82 09/30/2017 1003   GFRAA >60 09/01/2019 0558   GFRAA 95 09/30/2017 1003    Assessment/Planning: POD #7 s/p right renal stent, AAA stent graft, iliac stents, left femoral endarterectomy and left SFA  stent   Feeling week and unsteady on his feet  Ask PT to see him.  Patient and wife are interested in rehab at this point which is reasonable  For TEE today  Likely will need anticoagulation and would plan transition from heparin to oral anticoagulants tonight/tomorrow  Leotis Pain  09/01/2019, 10:39 AM

## 2019-09-01 NOTE — Progress Notes (Signed)
Patient back from specials, no complaints of pain. Alert and oriented x4. Can eat at 1600. VSS. Will continue to monitor patient.

## 2019-09-01 NOTE — Consult Note (Signed)
Cardiology Consultation:   Patient ID: DONDRELL LOUDERMILK; [id number]; 1931-03-23   Admit date: 08/30/2019 Date of Consult: 09/01/2019  Primary Care Provider: Margo Common, PA Primary Cardiologist: [name] Primary Electrophysiologist:  Caryl Comes   Patient Profile:   Howard [name] is a 85 y.o. male with a hx of nonobstructive CAD as detailed below, HFrEF secondary to NICM, WPW status post RFCA at [facility], complete heart block status post PPM status post generator change out in 06/2019 secondary to ERI, lung carcinoma, recently diagnosed PAD in the setting of ischemic left first toe in the late 07/2019 as detailed below, DM2, HTN, HLD, Bell's palsy, prostate cancer, vertigo, chronic back pain status post multiple surgeries, and sleep apnea who is being seen today for the evaluation of thromboembolism at the request of Dr. Lucky Cowboy.  History of Present Illness:   Mr. Grewell was previously evaluated at [facility] for WPW and underwent radiofrequency catheter ablation with subsequent development of bundle-branch block and complete heart block requiring pacemaker implantation in 2011.  Prior echo in 07/2014 showed an EF of 35 to 40%.  Diagnostic cardiac cath in 2015 in the setting of abnormal stress test showed nonobstructive disease.  EF subsequently improved to 45 to 50% in 2016 followed by reduction in EF to 40% in 2019.  Myoview in 07/2018 showed no significant ischemia with attenuation corrected images showing moderate sized region of fixed defect mid to apical inferior wall and apical region, EF 33%.  Overall, this was a moderate risk and given low EF and size of fixed defect.  More recently, he has been evaluated in the ED and admitted for ischemic left toe.  He was seen by vascular surgery and underwent bilateral external iliac stent placement, AAA stent graft repair, right renal artery stent, and left common femoral artery endarterectomy, and left SFA stent in late 07/2019.  Echo during that admission showed  improved LV systolic function with an EF of 55 to 60%, normal LV cavity size, moderate concentric LVH, diastolic dysfunction, normal RV systolic function and cavity size, mildly dilated left atrium, mild thickening of the mitral valve leaflet with mild calcification, grossly normal tricuspid valve, mild thickening of the aortic valve without stenosis.  Patient was readmitted to the hospital on 9/7 with complaints of abdominal pain.  CT imaging demonstrated right renal artery malperfusion and left splenic infarct.  Concern for thromboembolic phenomena.  Heparin drip was started.  Due to acute kidney injury ARB and diuretics were held.  With this, renal function has normalized.  Cardiology has been consulted for further evaluation of potential thromboembolic phenomena with planned TEE.   Past Medical History:  Diagnosis Date   Arthritis    Bell palsy    Cancer [facility])    prostate and skin   Chronic combined systolic and diastolic CHF, NYHA class 1 ([place])    a. 07/2014 Echo: EF 35-40%, Gr 1 DD.   Complete heart block ([place])    a. 11/2010 s/p SJM 2210 [facility]ent DC PPM, ser# [id number].   Depression    Diabetes mellitus without complication ([place])    Fall 11-10-14   GERD (gastroesophageal reflux disease)    History of prostate cancer    Hyperlipidemia    Hypertension    LBBB (left bundle branch block)    Left-sided Bell's palsy    Lung cancer ([place]) 2016   NICM (nonischemic cardiomyopathy) ([place])    a. 07/2014 Echo: EF 35-40%, mid-apicalanteroseptal DK, Gr 1 DD, mild-mod dil LA.  Non-obstructive CAD    a. 07/2014 Abnl MV;  b. 08/2014 Cath: LM nl, LAD 30p, RI 40p, LCX nl, OM1 40, RCA dominant 30p, 70d-->Med Rx.   Poor balance    Presence of permanent cardiac pacemaker    Sleep apnea    a. cpap   Vertigo    WPW (Wolff-Parkinson-White syndrome)    a. S/P RFCA 1991.    Past Surgical History:  Procedure Laterality Date   ABDOMINAL AORTIC ENDOVASCULAR STENT GRAFT  08/25/2019    Procedure: ABDOMINAL AORTIC ENDOVASCULAR STENT GRAFT;  Surgeon: Algernon Huxley, MD;  Location: [facility] ORS;  Service: Vascular;;   ANGIOPLASTY Left 08/25/2019   Procedure: ANGIOPLASTY;  Surgeon: Algernon Huxley, MD;  Location: [facility] ORS;  Service: Vascular;  Laterality: Left;  left SFA and stent placement   APPLICATION OF WOUND VAC Left 06/07/2015   Procedure: APPLICATION OF WOUND VAC;  Surgeon: Robert Bellow, MD;  Location: [facility] ORS;  Service: General;  Laterality: Left;  left upper back   [place]  08/26/2014   Single vessel obstructive CAD   CARPAL TUNNEL RELEASE  04-04-15   [facility]   CATARACT EXTRACTION  07-31-11 and 09-18-11   Catheter ablation  1991   for WPW   cervical fusion     CHOLECYSTECTOMY  09-07-14   ENDARTERECTOMY FEMORAL Left 08/25/2019   Procedure: ENDARTERECTOMY FEMORAL;  Surgeon: Algernon Huxley, MD;  Location: [facility] ORS;  Service: Vascular;  Laterality: Left;  common and produndis    ENDOVASCULAR REPAIR/STENT GRAFT Right 08/25/2019   Procedure: ENDOVASCULAR REPAIR/STENT GRAFT;  Surgeon: Algernon Huxley, MD;  Location: [facility] ORS;  Service: Vascular;  Laterality: Right;  renal artery   HAND SURGERY     right 1993; left 2005   [place] / REPLACE / REMOVE PACEMAKER     INSERTION OF ILIAC STENT Bilateral 08/25/2019   Procedure: INSERTION OF ILIAC STENT;  Surgeon: Algernon Huxley, MD;  Location: [facility] ORS;  Service: Vascular;  Laterality: Bilateral;   JOINT REPLACEMENT Left 2013   knee   JOINT REPLACEMENT Right 2004   knee   KNEE SURGERY     left knee 1991 and 1992; right knee [place] N/A 08/26/2014   Procedure: LEFT HEART CATHETERIZATION WITH CORONARY ANGIOGRAM;  Surgeon: [name], MD;  Location: [facility] CATH LAB;  Service: Cardiovascular;  Laterality: N/A;   LOWER EXTREMITY ANGIOGRAPHY Left 08/23/2019   Procedure: Lower Extremity Angiography;  Surgeon: Algernon Huxley, MD;   Location: [place] CV LAB;  Service: Cardiovascular;  Laterality: Left;   LUMBAR LAMINECTOMY/DECOMPRESSION MICRODISCECTOMY N/A 06/07/2014   Procedure: LUMBAR FOUR TO FIVE LUMBAR LAMINECTOMY/DECOMPRESSION MICRODISCECTOMY 1 LEVEL;  Surgeon: [name], MD;  Location: [place] NEURO ORS;  Service: Neurosurgery;  Laterality: N/A;   LUNG BIOPSY Right 2016   Dr [name]   PACEMAKER INSERTION     PPM-- St Jude 11/30/10 by Storm Frisk GENERATOR CHANGEOUT N/A 07/09/2019   Procedure: PPM GENERATOR CHANGEOUT;  Surgeon: Deboraha Sprang, MD;  Location: [place] CV LAB;  Service: Cardiovascular;  Laterality: N/A;   PROSTATE SURGERY     cancer--1998, prostatectomy   REPLACEMENT TOTAL KNEE     2004   ruptured disc     1962 and [place] N/A 07/09/2019   Procedure: TEMPORARY PACEMAKER;  Surgeon: Deboraha Sprang, MD;  Location: [facility]  INVASIVE CV LAB;  Service: Cardiovascular;  Laterality: N/A;   TRIGGER FINGER RELEASE  01-24-15   WOUND DEBRIDEMENT Left 06/07/2015   Procedure: DEBRIDEMENT WOUND;  Surgeon: Robert Bellow, MD;  Location: [facility] ORS;  Service: General;  Laterality: Left;  left upper back     Home Meds: Prior to Admission medications   Medication Sig Start Date End Date Taking? Authorizing Provider  aspirin EC 81 MG tablet Take 81 mg by mouth daily.   Yes [provider]  Cholecalciferol (VITAMIN D3) 1000 UNITS CAPS Take 1,000 Units by mouth daily.    Yes [provider]  clopidogrel (PLAVIX) 75 MG tablet Take 1 tablet (75 mg total) by mouth daily. 08/29/19  Yes Wieting, Richard, MD  fentaNYL (DURAGESIC) 12 MCG/HR Place 1 patch onto the skin every 3 (three) days. 08/02/19 09/01/19 Yes Molli Barrows, MD  isosorbide mononitrate (IMDUR) 30 MG 24 hr tablet TAKE 1 TABLET BY MOUTH DAILY Patient taking differently: Take 30 mg by mouth daily.  08/02/19  Yes [name], MD  metFORMIN (GLUCOPHAGE) 500 MG tablet Take 1 tablet (500 mg total) by mouth 2 (two) times  daily with a meal. 11/27/18  Yes Chrismon, [name], PA  metoprolol succinate (TOPROL-XL) 25 MG 24 hr tablet Take 1 tablet (25 mg) by mouth once daily at bedtime Patient taking differently: Take 25 mg by mouth at bedtime.  11/26/18  Yes Gollan, Kathlene November, MD  montelukast (SINGULAIR) 10 MG tablet Take 10 mg by mouth daily as needed (sneezing/allergies.).    Yes [provider]  Multiple Vitamin (MULTIVITAMIN WITH MINERALS) TABS tablet Take 1 tablet by mouth daily. One-A-Day Multivitamin   Yes [provider]  omeprazole (PRILOSEC) 40 MG capsule Take 1 capsule (40 mg total) by mouth 2 (two) times daily as needed. Patient taking differently: Take 40 mg by mouth 2 (two) times a day.  12/25/18  Yes Chrismon, [name], PA  sacubitril-valsartan (ENTRESTO) 24-26 MG Take 1 tablet by mouth 2 (two) times daily. 08/28/19  Yes Wieting, Richard, MD  simvastatin (ZOCOR) 40 MG tablet TAKE 1 TABLET AT BEDTIME Patient taking differently: Take 40 mg by mouth at bedtime.  08/11/19  Yes [name], MD  spironolactone (ALDACTONE) 50 MG tablet Take 1 tablet (50 mg) by mouth once daily in the morning Patient taking differently: Take 50 mg by mouth every morning.  11/24/18  Yes Gollan, Kathlene November, MD  fluticasone (FLONASE) 50 MCG/ACT nasal spray Place 2 sprays into both nostrils daily.    [provider]  mupirocin ointment (BACTROBAN) 2 % Apply to affected area 3 times daily Patient not taking: Reported on 08/30/2019 08/21/19 08/20/20  Blake Divine, MD    Inpatient Medications: Scheduled Meds:  amoxicillin-clavulanate  1 tablet Oral BID   aspirin EC  81 mg Oral Daily   cholecalciferol  1,000 Units Oral Daily   clopidogrel  75 mg Oral Daily   fentaNYL  1 patch Transdermal Q72H   isosorbide mononitrate  30 mg Oral Daily   metFORMIN  500 mg Oral BID WC   metoprolol succinate  25 mg Oral QHS   multivitamin with minerals  1 tablet Oral Daily   pantoprazole  40 mg Oral Daily   senna   1 tablet Oral BID   simvastatin  40 mg Oral QHS   sodium chloride flush  10 mL Intravenous Q12H   Continuous Infusions:  sodium chloride 75 mL/hr at 08/31/19 1838   heparin 1,500 Units/hr (09/01/19  70)   PRN Meds: acetaminophen **OR** acetaminophen, alum & mag hydroxide-simeth, bisacodyl, fluticasone, hydrALAZINE, labetalol, metoprolol tartrate, montelukast, ondansetron, oxyCODONE, polyethylene glycol, sodium phosphate  Allergies:   Allergies  Allergen Reactions   Sulfa Antibiotics Rash    Social History:   Social History   Socioeconomic History   Marital status: Married    Spouse name: Not on file   Number of children: 2   Years of education: College   Highest education level: Some college, no degree  Occupational History   Occupation: Retired  Scientist, product/process development strain: Not hard at [employer] insecurity    Worry: Never true    Inability: Never true   Transportation needs    Medical: No    Non-medical: No  Tobacco Use   Smoking status: Former Smoker    Years: 4.00   Smokeless tobacco: Never Used   Tobacco comment: Quit 2011  Substance and Sexual Activity   Alcohol use: No    Frequency: Never   Drug use: No   Sexual activity: Not on file  Lifestyle   Physical activity    Days per week: 0 days    Minutes per session: 0 min   Stress: Not at all  Relationships   Social connections    Talks on phone: Patient refused    Gets together: Patient refused    Attends religious service: Patient refused    Active member of club or organization: Patient refused    Attends meetings of clubs or organizations: Patient refused    Relationship status: Patient refused   Intimate partner violence    Fear of current or ex partner: Patient refused    Emotionally abused: Patient refused    Physically abused: Patient refused    Forced sexual activity: Patient refused  Other Topics Concern   Not on file  Social History Narrative    Drinks 2 cups of coffee a day      Family History:   Family History  Problem Relation Age of Onset   Heart attack Mother    Hyperlipidemia Mother    CAD Other    Prostate cancer Neg Hx     ROS:  Review of Systems  Constitutional: Positive for malaise/fatigue. Negative for chills, diaphoresis, fever and weight loss.  HENT: Negative for congestion.   Eyes: Negative for discharge and redness.  Respiratory: Negative for cough, hemoptysis, sputum production, shortness of breath and wheezing.   Cardiovascular: Negative for chest pain, palpitations, orthopnea, claudication, leg swelling and PND.  Gastrointestinal: Positive for abdominal pain. Negative for blood in stool, heartburn, melena, nausea and vomiting.  Genitourinary: Negative for hematuria.  Musculoskeletal: Negative for falls and myalgias.  Skin: Negative for rash.  Neurological: Positive for weakness. Negative for dizziness, tingling, tremors, sensory change, speech change, focal weakness and loss of consciousness.  Endo/Heme/Allergies: Does not bruise/bleed easily.  Psychiatric/Behavioral: Negative for substance abuse. The patient is not nervous/anxious.   All other systems reviewed and are negative.     Physical Exam/Data:   Vitals:   08/31/19 1508 08/31/19 1920 09/01/19 0424 09/01/19 0736  BP: (!) 105/53 123/61 (!) 143/66 (!) 147/71  Pulse: 88 86 (!) 103 79  Resp: 20 18 18 18   Temp: 97.9 F (36.6 C) 99.2 F (37.3 C) 98.9 F (37.2 C) 98.4 F (36.9 C)  TempSrc: Oral Oral Oral Oral  SpO2: 94% 96% 98% 97%  Weight:   84.6 kg   Height:  Intake/Output Summary (Last 24 hours) at 09/01/2019 0759 Last data filed at 09/01/2019 0736 Gross per 24 hour  Intake 2300.73 ml  Output 1250 ml  Net 1050.73 ml   Filed Weights   08/30/19 1105 08/30/19 2020 09/01/19 0424  Weight: 89.8 kg 83 kg 84.6 kg   Body mass index is 27.54 kg/m.   Physical Exam: General: Well developed, well nourished, in no acute  distress. Head: Normocephalic, atraumatic, sclera non-icteric, no xanthomas, nares without discharge.  Neck: Negative for carotid bruits. JVD not elevated. Lungs: Clear bilaterally to auscultation without wheezes, rales, or rhonchi. Breathing is unlabored. Heart: RRR with S1 S2. No murmurs, rubs, or gallops appreciated. Abdomen: Soft, non-tender, non-distended with normoactive bowel sounds. No hepatomegaly. No rebound/guarding. No obvious abdominal masses. Msk:  Strength and tone appear normal for age. Extremities: No clubbing or cyanosis. No edema. Distal pedal pulses are 2+ and equal bilaterally. Neuro: Alert and oriented X 3. No facial asymmetry. No focal deficit. Moves all extremities spontaneously. Psych:  Responds to questions appropriately with a normal affect.   EKG:  The EKG was personally reviewed and demonstrates: V paced rhythm, 94 bpm Telemetry:  Telemetry was personally reviewed and demonstrates: V-paced   Weights: Filed Weights   08/30/19 1105 08/30/19 2020 09/01/19 0424  Weight: 89.8 kg 83 kg 84.6 kg    Relevant CV Studies: 2D Echo 08/23/2019:  1. The left ventricle has normal systolic function, with an ejection fraction of 55-60%. The cavity size was normal. There is moderate concentric left ventricular hypertrophy. Left ventricular diastolic Doppler parameters are consistent with impaired relaxation.  2. The right ventricle has normal systolic function. The cavity was normal. There is no increase in right ventricular wall thickness. Right ventricular systolic pressure could not be assessed.  3. Left atrial size was mildly dilated.  4. The mitral valve is abnormal. Mild thickening of the mitral valve leaflet. Mild calcification of the mitral valve leaflet.  5. The tricuspid valve is grossly normal.  6. The aortic valve is abnormal. Mild thickening of the aortic valve. Mild calcification of the aortic valve. No stenosis of the aortic valve.  7. The aorta is not well  visualized unless otherwise noted.  Laboratory Data:  Chemistry Recent Labs  Lab 08/30/19 1208 08/31/19 0457 09/01/19 0558  NA 130* 130* 133*  K 4.6 3.7 4.1  CL 95* 99 102  CO2 21* 20* 19*  GLUCOSE 171* 136* 134*  BUN 32* 24* 14  CREATININE 1.32* 1.05 0.98  CALCIUM 9.3 8.3* 8.6*  GFRNONAA 49* >60 >60  GFRAA 56* >60 >60  ANIONGAP 14 11 12     Recent Labs  Lab 08/31/19 0457  PROT 6.1*  ALBUMIN 3.1*  AST 22  ALT 17  ALKPHOS 41  BILITOT 0.7   Hematology Recent Labs  Lab 08/30/19 1208 08/31/19 0457 09/01/19 0558  WBC 8.1 5.6 6.2  RBC 4.65 3.88* 3.94*  HGB 12.4* 10.4* 10.5*  HCT 37.8* 31.2* 31.5*  MCV 81.3 80.4 79.9*  MCH 26.7 26.8 26.6  MCHC 32.8 33.3 33.3  RDW 13.9 13.8 13.7  PLT 271 266 297   Cardiac EnzymesNo results for input(s): TROPONINI in the last 168 hours. No results for input(s): TROPIPOC in the last 168 hours.  BNPNo results for input(s): BNP, PROBNP in the last 168 hours.  DDimer No results for input(s): DDIMER in the last 168 hours.  Radiology/Studies:  Ct Abdomen Pelvis W Contrast  Result Date: 08/30/2019 IMPRESSION: Interval endoluminal stenting of abdominal aortic aneurysm into  iliac arteries bilaterally. Impaired nephrogram at anterior half of the RIGHT kidney either representing infarct or hypoperfusion in patient with a new RIGHT renal artery stent. New small splenic infarct. Infiltrative changes at the inguinal regions bilaterally consistent with recent endovascular access, extending along the distal aspect of the LEFT external iliac vessels. Small cyst and small angiomyolipoma of the LEFT kidney. Findings called to Dr. Jari Pigg on 07/12/2019 at 1618 hours. Electronically Signed   By: Lavonia Dana M.D.   On: 08/30/2019 16:19   Dg Chest Portable 1 View  Result Date: 08/30/2019 IMPRESSION: Central venous congestion without overt edema. Left basilar scarring or atelectasis. Electronically Signed   By: Audie Pinto M.D.   On: 08/30/2019 16:21     Assessment and Plan:   1.  Splenic/right renal infarcts with concern for cardiac etiology: -Continue heparin drip with plans to transition to oral anticoagulation prior to discharge -Schedule TEE for this afternoon -Patient has pacemaker implanted which will be interrogated by representative later today to evaluate for A. Fib/flutter  2.  Nonobstructive CAD: -No symptoms concerning for angina -Continue current medication regimen including aspirin, statin, and beta-blocker -No plans for inpatient ischemic evaluation at this time  3.  HFrEF secondary to NICM: -Patient with subsequent normalization of EF by echo 9 days ago -Continue beta-blocker -Spironolactone and Entresto on hold secondary to AKI upon presentation, resume prior to discharge  4.  PAD: -Status post recent bilateral external iliac artery stenting, left SFA stenting, right renal artery stenting, AAA graft repair, and left common femoral endarterectomy per vascular surgery -Remains on dual antiplatelet therapy managed by vascular -Prior to discharge given need for likely oral anticoagulation we will need to consolidate medications with vascular surgery in an effort to minimize bleeding risk with triple therapy  5.  AKI: -Likely in the setting of the above -Improved  6.  Complete heart block: -Status post PPM -Device appears to be functioning normally -Follow-up with EP as directed  7.  HTN: -Blood pressure is reasonably controlled -Resume medications as above when able  8.  HLD: -Continue statin   For questions or updates, please contact [place] Please consult [email or web address] for contact info under Cardiology/STEMI.   Signed, Christell Faith, PA-C [place] Pager: [phone] 09/01/2019, 7:59 AM

## 2019-09-01 NOTE — Progress Notes (Signed)
*  PRELIMINARY RESULTS* Echocardiogram Echocardiogram Transesophageal has been performed.  Wallie Char Tamaria Dunleavy 09/01/2019, 2:46 PM

## 2019-09-01 NOTE — Progress Notes (Signed)
ANTICOAGULATION CONSULT NOTE - Initial Consult  Pharmacy Consult for Heparin  Indication: renal and splenic infarct  Allergies  Allergen Reactions  . Sulfa Antibiotics Rash    Patient Measurements: Height: 5\' 9"  (175.3 cm) Weight: 186 lb 8 oz (84.6 kg) IBW/kg (Calculated) : 70.7 Heparin Dosing Weight: 83 kg   Vital Signs: Temp: 98.9 F (37.2 C) (09/09 0424) Temp Source: Oral (09/09 0424) BP: 143/66 (09/09 0424) Pulse Rate: 103 (09/09 0424)  Labs: Recent Labs    08/30/19 1208 08/30/19 2029 08/31/19 0457 08/31/19 0803 08/31/19 2024 09/01/19 0558  HGB 12.4*  --  10.4*  --   --  10.5*  HCT 37.8*  --  31.2*  --   --  31.5*  PLT 271  --  266  --   --  297  APTT  --  41*  --   --   --   --   LABPROT  --  14.1  --   --   --   --   INR  --  1.1  --   --   --   --   HEPARINUNFRC  --   --   --  0.71* 0.45 0.41  CREATININE 1.32*  --  1.05  --   --  0.98    Estimated Creatinine Clearance: 54.1 mL/min (by C-G formula based on SCr of 0.98 mg/dL).   Medical History: Past Medical History:  Diagnosis Date  . Arthritis   . Bell palsy   . Cancer [facility])    prostate and skin  . Chronic combined systolic and diastolic CHF, NYHA class 1 ([place])    a. 07/2014 Echo: EF 35-40%, Gr 1 DD.  Marland Kitchen Complete heart block ([place])    a. 11/2010 s/p SJM 2210 Accent DC PPM, ser# [id number].  . Depression   . Diabetes mellitus without complication ([place])   . Fall 11-10-14  . GERD (gastroesophageal reflux disease)   . History of prostate cancer   . Hyperlipidemia   . Hypertension   . LBBB (left bundle branch block)   . Left-sided Bell's palsy   . Lung cancer ([place]) 2016  . NICM (nonischemic cardiomyopathy) ([place])    a. 07/2014 Echo: EF 35-40%, mid-apicalanteroseptal DK, Gr 1 DD, mild-mod dil LA.  . Non-obstructive CAD    a. 07/2014 Abnl MV;  b. 08/2014 Cath: LM nl, LAD 30p, RI 40p, LCX nl, OM1 40, RCA dominant 30p, 70d-->Med Rx.  Marland Kitchen Poor balance   . Presence of permanent cardiac pacemaker   . Sleep apnea     a. cpap  . Vertigo   . WPW (Wolff-Parkinson-White syndrome)    a. S/P RFCA 1991.    Medications:  Medications Prior to Admission  Medication Sig Dispense Refill Last Dose  . [EXPIRED] amoxicillin-clavulanate (AUGMENTIN) 875-125 MG tablet Take 1 tablet by mouth 2 (two) times daily for 6 doses. 6 tablet 0 08/29/2019 at 2100  . aspirin EC 81 MG tablet Take 81 mg by mouth daily.   08/29/2019 at 0900  . Cholecalciferol (VITAMIN D3) 1000 UNITS CAPS Take 1,000 Units by mouth daily.    08/29/2019 at 0900  . clopidogrel (PLAVIX) 75 MG tablet Take 1 tablet (75 mg total) by mouth daily. 30 tablet 0 08/29/2019 at 0900  . fentaNYL (DURAGESIC) 12 MCG/HR Place 1 patch onto the skin every 3 (three) days. 10 patch 0 08/29/2019 at 2100  . isosorbide mononitrate (IMDUR) 30 MG 24 hr tablet TAKE 1 TABLET BY MOUTH DAILY (Patient taking  differently: Take 30 mg by mouth daily. ) 90 tablet 1 08/29/2019 at 0900  . metFORMIN (GLUCOPHAGE) 500 MG tablet Take 1 tablet (500 mg total) by mouth 2 (two) times daily with a meal. 180 tablet 3 08/29/2019 at 2100  . metoprolol succinate (TOPROL-XL) 25 MG 24 hr tablet Take 1 tablet (25 mg) by mouth once daily at bedtime (Patient taking differently: Take 25 mg by mouth at bedtime. ) 90 tablet 3 08/29/2019 at 2100  . montelukast (SINGULAIR) 10 MG tablet Take 10 mg by mouth daily as needed (sneezing/allergies.).    08/29/2019 at prn  . Multiple Vitamin (MULTIVITAMIN WITH MINERALS) TABS tablet Take 1 tablet by mouth daily. One-A-Day Multivitamin   08/29/2019 at 0900  . omeprazole (PRILOSEC) 40 MG capsule Take 1 capsule (40 mg total) by mouth 2 (two) times daily as needed. (Patient taking differently: Take 40 mg by mouth 2 (two) times a day. ) 180 capsule 3 08/29/2019 at 2100  . sacubitril-valsartan (ENTRESTO) 24-26 MG Take 1 tablet by mouth 2 (two) times daily. 60 tablet 0 08/29/2019 at 2100  . simvastatin (ZOCOR) 40 MG tablet TAKE 1 TABLET AT BEDTIME (Patient taking differently: Take 40 mg by mouth at  bedtime. ) 90 tablet 0 08/29/2019 at 2100  . spironolactone (ALDACTONE) 50 MG tablet Take 1 tablet (50 mg) by mouth once daily in the morning (Patient taking differently: Take 50 mg by mouth every morning. ) 90 tablet 3 08/29/2019 at 0900  . fluticasone (FLONASE) 50 MCG/ACT nasal spray Place 2 sprays into both nostrils daily.   prn at prn  . mupirocin ointment (BACTROBAN) 2 % Apply to affected area 3 times daily (Patient not taking: Reported on 08/30/2019) 22 g 0 Completed Course at Unknown time    Assessment: Pharmacy consulted to start heparin for possible thromboembolism.   9/8 0803 HL 0.71 decrease rate to 1500 units/hr.   Goal of Therapy:  Heparin level 0.3-0.7 units/ml Monitor platelets by anticoagulation protocol: Yes   Plan:  09/09 @ 0600 HL 0.41 therapeutic. Will continue current rate and will recheck HL w/ am labs, CBC stable will continue to monitor.  Tobie Lords, PharmD, BCPS Clinical Pharmacist 09/01/2019,7:15 AM

## 2019-09-01 NOTE — Plan of Care (Signed)
  Problem: Education: Goal: Understanding of CV disease, CV risk reduction, and recovery process will improve Outcome: Progressing   Problem: Cardiovascular: Goal: Ability to achieve and maintain adequate cardiovascular perfusion will improve Outcome: Progressing Goal: Vascular access site(s) Level 0-1 will be maintained Outcome: Progressing   Problem: Health Behavior/Discharge Planning: Goal: Ability to manage health-related needs will improve Outcome: Progressing   Problem: Clinical Measurements: Goal: Ability to maintain clinical measurements within normal limits will improve Outcome: Progressing   Problem: Nutrition: Goal: Adequate nutrition will be maintained Outcome: Not Progressing Note: Poor appetite, dietary assessment

## 2019-09-01 NOTE — Progress Notes (Signed)
    Pacemaker interrogated by device rep with no significant arrhythmias noted.

## 2019-09-01 NOTE — H&P (Signed)
H&P Addendum, pre-TEE  Patient was seen and evaluated prior to -TEE procedure Symptoms, prior testing details again confirmed with the patient Patient examined, no significant change from prior exam Lab work reviewed in detail personally by myself Patient understands risk and benefit of the procedure, willing to proceed  Signed, Esmond Plants, MD, Ph.D [facility]

## 2019-09-01 NOTE — Progress Notes (Signed)
Transesophageal Echocardiogram :  Indication: Splenic infarct, rule out atrial thrombus Requesting/ordering  physician: Dr. Ronalee Belts  Procedure: Benzocaine spray x2 and 2 mls x 2 of viscous lidocaine were given orally to provide local anesthesia to the oropharynx. The patient was positioned supine on the left side, bite block provided. The patient was moderately sedated with the doses of versed and fentanyl as detailed below.  Using digital technique an omniplane probe was advanced into the distal esophagus without incident.   Moderate sedation: 1. Sedation used:  Versed: 4 mg, Fentanyl: 100 ug IV 2. Time administered:  1 pm   Time when patient started recovery:2 pm 3. I was face to face during this time  See report in EPIC  for complete details: In brief, no atrial thrombus noted,  PFO positive, Aortic athero noted, moderate diffuse    transgastric imaging revealed normal LV function with no RWMAs and no mural apical thrombus.  .  Estimated ejection fraction was 55 %.  Right sided cardiac chambers were normal with no evidence of pulmonary hypertension.  Imaging of the septum showed no ASD or VSD Bubble study was positive for shunt, positive for PFO Interatrial septal aneurysm noted  The LA was well visualized in orthogonal views.  There was no spontaneous contrast and no thrombus in the LA and LA appendage   The descending thoracic aorta and aortic arch  had   mural aortic debris, moderate diffuse atherosclerosis,  with no evidence of aneurysmal dilation or disection  Signed, Esmond Plants, MD, Ph.D [facility]

## 2019-09-02 ENCOUNTER — Encounter: Payer: Self-pay | Admitting: Cardiovascular Disease

## 2019-09-02 DIAGNOSIS — I442 Atrioventricular block, complete: Secondary | ICD-10-CM

## 2019-09-02 LAB — BASIC METABOLIC PANEL
Anion gap: 9 (ref 5–15)
BUN: 12 mg/dL (ref 8–23)
CO2: 19 mmol/L — ABNORMAL LOW (ref 22–32)
Calcium: 8.4 mg/dL — ABNORMAL LOW (ref 8.9–10.3)
Chloride: 101 mmol/L (ref 98–111)
Creatinine, Ser: 1.02 mg/dL (ref 0.61–1.24)
GFR calc Af Amer: >60 mL/min (ref >60)
GFR calc non Af Amer: >60 mL/min (ref >60)
Glucose, Bld: 121 mg/dL — ABNORMAL HIGH (ref 70–99)
Potassium: 4.4 mmol/L (ref 3.5–5.1)
Sodium: 129 mmol/L — ABNORMAL LOW (ref 135–145)

## 2019-09-02 LAB — CBC
HCT: 29.8 % — ABNORMAL LOW (ref 39.0–52.0)
Hemoglobin: 10.1 g/dL — ABNORMAL LOW (ref 13.0–17.0)
MCH: 27.1 pg (ref 26.0–34.0)
MCHC: 33.9 g/dL (ref 30.0–36.0)
MCV: 79.9 fL — ABNORMAL LOW (ref 80.0–100.0)
Platelets: 291 10*3/uL (ref 150–400)
RBC: 3.73 MIL/uL — ABNORMAL LOW (ref 4.22–5.81)
RDW: 13.7 % (ref 11.5–15.5)
WBC: 7.2 10*3/uL (ref 4.0–10.5)
nRBC: 0 % (ref 0.0–0.2)

## 2019-09-02 LAB — MAGNESIUM: Magnesium: 2 mg/dL (ref 1.7–2.4)

## 2019-09-02 MED ORDER — HYDROCODONE-ACETAMINOPHEN 5-325 MG PO TABS: 1.0000 | ORAL_TABLET | Freq: Four times a day (QID) | ORAL | 0 refills | Status: DC | PRN

## 2019-09-02 MED ORDER — APIXABAN 5 MG PO TABS: 5.0000 mg | ORAL_TABLET | Freq: Two times a day (BID) | ORAL | 60 refills | Status: DC

## 2019-09-02 MED ORDER — SODIUM CHLORIDE 1 G PO TABS
1.0000 g | ORAL_TABLET | Freq: Two times a day (BID) | ORAL | Status: DC
  Administered 2019-09-02: 1 g via ORAL
  Filled 2019-09-02: qty 1

## 2019-09-02 NOTE — Progress Notes (Signed)
 Vein & Vascular Surgery Daily Progress Note   Subjective: Patient underwent TEE yesterday and was found to have patent foramen ovale.  No issues overnight.  Without complaint this a.m.  Objective: Vitals:   09/01/19 1538 09/01/19 1929 09/02/19 0329 09/02/19 0744  BP: 123/63 (!) 106/56 (!) 121/56 140/62  Pulse: 79 96 79 75  Resp:  20  19  Temp: 98.2 F (36.8 C) 99.2 F (37.3 C) 97.9 F (36.6 C) 97.7 F (36.5 C)  TempSrc: Oral Oral Oral Oral  SpO2: 93% 94% 93% 96%  Weight:   83.9 kg   Height:        Intake/Output Summary (Last 24 hours) at 09/02/2019 1111 Last data filed at 09/02/2019 0854 Gross per 24 hour  Intake 133.69 ml  Output 450 ml  Net -316.31 ml   Physical Exam: A&Ox3, NAD CV: RRR Pulmonary: CTA Bilaterally Abdomen: Soft, Nontender, Nondistended Right groin: OR dressing removed.  Incision with Dermabond clean dry and intact.  No swelling or drainage or ecchymosis noted. Left groin: OR dressing removed.  Incision with Dermabond clean dry and intact.  No swelling or drainage or ecchymosis noted. Vascular:   Right lower extremity: Thigh soft.  Calf soft.  Extremities warm distally to toes.  Left lower extremity: Thigh soft.  Calf soft.  Extremity warm distally to toes   Laboratory: CBC    Component Value Date/Time   WBC 7.2 09/02/2019 0517   HGB 10.1 (L) 09/02/2019 0517   HGB 14.0 07/01/2019 0924   HCT 29.8 (L) 09/02/2019 0517   HCT 43.7 07/01/2019 0924   PLT 291 09/02/2019 0517   PLT 297 07/01/2019 0924   BMET    Component Value Date/Time   NA 129 (L) 09/02/2019 0517   NA 134 07/01/2019 0930   NA 137 11/10/2014 2041   K 4.4 09/02/2019 0517   K 3.9 11/10/2014 2041   CL 101 09/02/2019 0517   CL 104 11/10/2014 2041   CO2 19 (L) 09/02/2019 0517   CO2 24 11/10/2014 2041   GLUCOSE 121 (H) 09/02/2019 0517   GLUCOSE 154 (H) 11/10/2014 2041   BUN 12 09/02/2019 0517   BUN 28 (H) 07/01/2019 0930   BUN 13 11/10/2014 2041   CREATININE 1.02  09/02/2019 0517   CREATININE 0.81 09/30/2017 1003   CALCIUM 8.4 (L) 09/02/2019 0517   CALCIUM 8.5 11/10/2014 2041   GFRNONAA >60 09/02/2019 0517   GFRNONAA 82 09/30/2017 1003   GFRAA >60 09/02/2019 0517   GFRAA 95 09/30/2017 1003   Assessment/Planning: The patient is an 85 year old male with multiple medical issues found to have patent foramen ovale however no arterial thrombus 1) pending PT/OT evaluation.  Patient/his family would like him to be discharged home with services. 2) repleted sodium 3) hemoglobin stable. 4) creatinine stable. 5) Heparin discontinued.  Aspirin and Eliquis restarted. 6) consulted social work for possible medical equipment, home physical therapy and nursing services.  Seen and examined with Dr. Ellis [facility] PA-C 09/02/2019 11:11 AM

## 2019-09-02 NOTE — Discharge Instructions (Signed)
You may shower. Keep groin incisions clean and dry. No driving while on pain medication. No driving until follow up with Dr. Lucky Cowboy

## 2019-09-02 NOTE — Progress Notes (Signed)
Patient became disoriented to situation and place during the night but was easily redirected.

## 2019-09-02 NOTE — Evaluation (Signed)
Occupational Therapy Evaluation Patient Details Name: Howard [name] MRN: [id number] DOB: 01-Jun-1931 Today's Date: 09/02/2019    History of Present Illness Pt is an 85 y.o. male presenting to hospital 08/30/19 with difficulty urinating.  Pt noted with splenic infarct and R renal artery malperfusion and s/p TEE 09/01/19.  Of note, pt s/p left common femoral artery and profunda femoris artery endarterectomy with core matrix patch angioplasty, left SFA angioplasty and stent placement, right renal artery stent placement, aortic stent placement with limb extension into the external right iliac artery, left external iliac artery stent placement 08/25/19.   PMH includes PAD, DM, CHF, vertigo, HTN, Bell Palsy, prostate CA, lung CA, pacemaker, WPW syndrome.   Clinical Impression   Pt is 85 year old male who was recently dischargef from [facility] and readmitted due to difficulty urinating and underwent endarterectomies (see above for details) and recently a R renal artery malperfusion and TEE on 09/01/19. Pt is sitting up in bed with his wife sitting bedside.  He is HOH and cooperative but impulsive.  He is able to complete LB dressing skills and ambulate to the bathroom with min assist to min guard once he is up and walking but needs cues for safety.  Pain has decreased in BLEs as well as edema per patient and wfe's report.  He is not in need of any AD for ADLs but would benefit from education and training to work on increasing safety and independence in ADLs, balance and functional mobility training, and family ed and training.  Will monitor progress in OT but most likely will not need any further OT since pain and edema in LEs has decreased with improved function.    Follow Up Recommendations  No OT follow up    Equipment Recommendations       Recommendations for Other Services       Precautions / Restrictions Precautions Precautions: Fall Precaution Comments: B groin incisions Restrictions Weight Bearing  Restrictions: No      Mobility Bed Mobility   Bed Mobility: Supine to Sit;Sit to Supine     Supine to sit: Modified independent (Device/Increase time) Sit to supine: Modified independent (Device/Increase time)   General bed mobility comments: No difficulties noted  Transfers Overall transfer level: Needs assistance Equipment used: Rolling walker (2 wheeled) Transfers: Sit to/from Stand Sit to Stand: Supervision         General transfer comment: fairly strong stand from bed x2 trials with RW use; controlled descent sitting onto bed x2 trials    Balance Overall balance assessment: Needs assistance Sitting-balance support: No upper extremity supported;Feet supported Sitting balance-Leahy Scale: Normal Sitting balance - Comments: steady sitting reaching outside BOS   Standing balance support: No upper extremity supported Standing balance-Leahy Scale: Good Standing balance comment: steady standing washing hands at sink                           ADL either performed or assessed with clinical judgement   ADL Overall ADL's : Needs assistance/impaired Eating/Feeding: Sitting;Set up;Independent   Grooming: Sitting;Set up;Independent   Upper Body Bathing: Sitting;Set up;Supervision/ safety   Lower Body Bathing: Minimal assistance;Set up;Supervison/ safety   Upper Body Dressing : Sitting;Set up;Supervision/safety   Lower Body Dressing: Sit to/from stand;Set up;Minimal assistance   Toilet Transfer: Min guard;Stand-pivot;Regular Toilet;Grab bars;RW   Toileting- Clothing Manipulation and Hygiene: Sit to/from stand;Minimal assistance;Set up       Functional mobility during ADLs: Min guard;Rolling walker General  ADL Comments: Pt is impulsive with ambulation to bathroom with RW and no LOB but needed cues to be safe. Pt is able to complete LB dressing sitting EOB with min assist and cues with no LOB but not very safe and would benefit from instruction and practice  while in hospital.     Vision Baseline Vision/History: Wears glasses Wears Glasses: Reading only Patient Visual Report: No change from baseline       Perception     Praxis      Pertinent Vitals/Pain Pain Assessment: No/denies pain Pain Intervention(s): Limited activity within patient's tolerance;Monitored during session;Repositioned     Hand Dominance Right   Extremity/Trunk Assessment Upper Extremity Assessment Upper Extremity Assessment: Generalized weakness   Lower Extremity Assessment Lower Extremity Assessment: Defer to PT evaluation   Cervical / Trunk Assessment Cervical / Trunk Assessment: Kyphotic   Communication Communication Communication: HOH   Cognition Arousal/Alertness: Awake/alert Behavior During Therapy: WFL for tasks assessed/performed Overall Cognitive Status: Within Functional Limits for tasks assessed                                     General Comments       Exercises     Shoulder Instructions      Home Living Family/patient expects to be discharged to:: Private residence Living Arrangements: Spouse/significant other Available Help at Discharge: Available 24 hours/day;Family;Available PRN/intermittently Type of Home: House Home Access: Stairs to enter [employer] of Steps: 3 Entrance Stairs-Rails: Right;Left;Can reach both Home Layout: Laundry or work area in basement;One level     Bathroom Shower/Tub: Occupational psychologist: Handicapped height Bathroom Accessibility: Yes How Accessible: Accessible via walker Home Equipment: Grab bars - tub/shower;Cane - single point;Shower seat;Other (comment);Electric scooter;Bedside commode;Walker - 2 wheels          Prior Functioning/Environment Level of Independence: Independent with assistive device(s)        Comments: Since recent hospital discharge pt has been holding onto items in home to ambulate for balance and had difficulty advancing R LE; h/o  occasional use of lofstrand crutches for appts.        OT Problem List: Decreased strength;Decreased coordination;Decreased range of motion;Decreased activity tolerance;Decreased safety awareness;Decreased knowledge of use of DME or AE;Decreased knowledge of precautions;Impaired balance (sitting and/or standing)      OT Treatment/Interventions: Self-care/ADL training;Therapeutic exercise;Balance training;Therapeutic activities;DME and/or AE instruction;Patient/family education    OT Goals(Current goals can be found in the care plan section) Acute Rehab OT Goals Patient Stated Goal: to go home soon OT Goal Formulation: With patient Time For Goal Achievement: 09/16/19 Potential to Achieve Goals: Good ADL Goals Pt Will Perform Lower Body Dressing: with set-up;with supervision;sit to/from stand Pt Will Transfer to Toilet: with set-up;with supervision;regular height toilet  OT Frequency: Min 1X/week   Barriers to D/C:            Co-evaluation              AM-PAC OT "6 Clicks" Daily Activity     Outcome Measure Help from another person eating meals?: None Help from another person taking care of personal grooming?: None Help from another person toileting, which includes using toliet, bedpan, or urinal?: A Little Help from another person bathing (including washing, rinsing, drying)?: A Little Help from another person to put on and taking off regular upper body clothing?: None Help from another person to put on and taking  off regular lower body clothing?: A Little 6 Click Score: 21   End of Session Equipment Utilized During Treatment: Gait belt  Activity Tolerance: Patient tolerated treatment well Patient left: in bed;with call bell/phone within reach;with family/visitor present;with bed alarm set  OT Visit Diagnosis: Other abnormalities of gait and mobility (R26.89);Muscle weakness (generalized) (M62.81)                Time: 1620-1700 OT Time Calculation (min): 40  min Charges:  OT General Charges $OT Visit: 1 Visit OT Evaluation $OT Eval Low Complexity: 1 Low OT Treatments $Self Care/Home Management : 23-37 mins  Chrys Racer, OTR/L, [place] ascom [phone] 09/02/19, 5:17 PM

## 2019-09-02 NOTE — Evaluation (Signed)
Physical Therapy Evaluation Patient Details Name: Howard [name] MRN: [id number] DOB: 1931-04-11 Today's Date: 09/02/2019   History of Present Illness  Pt is an 85 y.o. male presenting to hospital 08/30/19 with difficulty urinating.  Pt noted with splenic infarct and R renal artery malperfusion and s/p TEE 09/01/19.  Of note, pt s/p left common femoral artery and profunda femoris artery endarterectomy with core matrix patch angioplasty, left SFA angioplasty and stent placement, right renal artery stent placement, aortic stent placement with limb extension into the external right iliac artery, left external iliac artery stent placement 08/25/19.   PMH includes PAD, DM, CHF, vertigo, HTN, Bell Palsy, prostate CA, lung CA, pacemaker, WPW syndrome.  Clinical Impression  Prior to hospital admission, pt was ambulating within home holding onto furniture as needed and reported difficulty advancing R LE.  Pt lives with his wife on main level of home with 3 steps with B railings to enter.  Currently pt is modified independent with bed mobility; SBA with transfers; CGA with ambulation 200 feet using RW; and CGA navigating 3 steps with B railings.  Overall pt steady with ambulation using RW during session's activities and pt reporting significant improvement of R LE use.  Generalized weakness noted.  Pt and pt's wife educated on pacing, taking his time with activities, and use of RW for safety with functional mobility (both appearing with good understanding).  Pt would benefit from skilled PT to address noted impairments and functional limitations (see below for any additional details).  Upon hospital discharge, recommend pt discharge with HHPT and support of family.    Follow Up Recommendations Home health PT    Equipment Recommendations  Rolling walker with 5" wheels;3in1 (PT)    Recommendations for Other Services OT consult     Precautions / Restrictions Precautions Precautions: Fall Precaution Comments: B  groin incisions Restrictions Weight Bearing Restrictions: No      Mobility  Bed Mobility   Bed Mobility: Supine to Sit;Sit to Supine     Supine to sit: Modified independent (Device/Increase time) Sit to supine: Modified independent (Device/Increase time)   General bed mobility comments: No difficulties noted  Transfers Overall transfer level: Needs assistance Equipment used: Rolling walker (2 wheeled) Transfers: Sit to/from Stand Sit to Stand: Supervision         General transfer comment: fairly strong stand from bed x2 trials with RW use; controlled descent sitting onto bed x2 trials  Ambulation/Gait Ambulation/Gait assistance: Min guard Gait Distance (Feet): 200 Feet Assistive device: Rolling walker (2 wheeled)   Gait velocity: decreased   General Gait Details: partial step through gait pattern; steady with RW; occasional vc's to stay closer to RW; pt tending to walk faster back towards room requiring vc's to slow down (although still steady)  Stairs Stairs: Yes Stairs assistance: Min guard Stair Management: One rail Right;One rail Left;Alternating pattern;Forwards Number of Stairs: 3 General stair comments: mild increased effort to ascend steps but overall steady and safe  Wheelchair Mobility    Modified Rankin (Stroke Patients Only)       Balance Overall balance assessment: Needs assistance Sitting-balance support: No upper extremity supported;Feet supported Sitting balance-Leahy Scale: Normal Sitting balance - Comments: steady sitting reaching outside BOS   Standing balance support: No upper extremity supported Standing balance-Leahy Scale: Good Standing balance comment: steady standing washing hands at sink  Pertinent Vitals/Pain Pain Assessment: No/denies pain Pain Intervention(s): Limited activity within patient's tolerance;Monitored during session;Repositioned  Vitals (HR and O2 on room air) stable and  WFL throughout treatment session.    Home Living Family/patient expects to be discharged to:: Private residence Living Arrangements: Spouse/significant other Available Help at Discharge: Available 24 hours/day;Family;Available PRN/intermittently Type of Home: House Home Access: Stairs to enter Entrance Stairs-Rails: Right;Left;Can reach both Entrance Stairs-Number of Steps: 3 Home Layout: Laundry or work area in basement;One level Home Equipment: Grab bars - tub/shower;Cane - single point;Shower seat;Other (comment);Electric scooter;Bedside commode;Walker - 2 wheels      Prior Function Level of Independence: Independent with assistive device(s)         Comments: Since recent hospital discharge pt has been holding onto items in home to ambulate for balance and had difficulty advancing R LE; h/o occasional use of lofstrand crutches for appts.     Hand Dominance        Extremity/Trunk Assessment   Upper Extremity Assessment Upper Extremity Assessment: Generalized weakness    Lower Extremity Assessment Lower Extremity Assessment: Generalized weakness    Cervical / Trunk Assessment Cervical / Trunk Assessment: Kyphotic  Communication   Communication: HOH(Hearing aides)  Cognition Arousal/Alertness: Awake/alert Behavior During Therapy: WFL for tasks assessed/performed Overall Cognitive Status: Within Functional Limits for tasks assessed                                        General Comments   Nursing cleared pt for participation in physical therapy.  Pt agreeable to PT session.  Pt's wife present during session.    Exercises  Gait training with RW   Assessment/Plan    PT Assessment Patient needs continued PT services  PT Problem List Decreased strength;Decreased activity tolerance;Decreased balance;Decreased mobility;Decreased knowledge of use of DME       PT Treatment Interventions DME instruction;Gait training;Stair training;Functional mobility  training;Therapeutic activities;Therapeutic exercise;Balance training;Patient/family education    PT Goals (Current goals can be found in the Care Plan section)  Acute Rehab PT Goals Patient Stated Goal: to go home and have improved mobility PT Goal Formulation: With patient Time For Goal Achievement: 09/16/19 Potential to Achieve Goals: Good    Frequency Min 2X/week   Barriers to discharge        Co-evaluation               AM-PAC PT "6 Clicks" Mobility  Outcome Measure Help needed turning from your back to your side while in a flat bed without using bedrails?: None Help needed moving from lying on your back to sitting on the side of a flat bed without using bedrails?: None Help needed moving to and from a bed to a chair (including a wheelchair)?: A Little Help needed standing up from a chair using your arms (e.g., wheelchair or bedside chair)?: A Little Help needed to walk in hospital room?: A Little Help needed climbing 3-5 steps with a railing? : A Little 6 Click Score: 20    End of Session Equipment Utilized During Treatment: Gait belt Activity Tolerance: Patient tolerated treatment well Patient left: in bed;with call bell/phone within reach;with bed alarm set;with nursing/sitter in room;with family/visitor present Nurse Communication: Mobility status;Precautions PT Visit Diagnosis: Other abnormalities of gait and mobility (R26.89);Muscle weakness (generalized) (M62.81);Difficulty in walking, not elsewhere classified (R26.2)    Time: 1501-1530 PT Time Calculation (min) (ACUTE ONLY): 29 min  Charges:   PT Evaluation $PT Eval Low Complexity: 1 Low PT Treatments $Gait Training: 8-22 mins       Leitha Bleak, PT 09/02/19, 3:49 PM [phone]

## 2019-09-02 NOTE — Progress Notes (Addendum)
Patient given discharge instructions with wife at bedside. IV taken out and tele monitor off. Patient and wife verbalized understanding with no further questions or concerns. Patient going home. Case discussed with case manager, aware that patient is discharging home. Patient has eliquis coupon.

## 2019-09-02 NOTE — TOC Initial Note (Signed)
Transition of Care [facility]) - Initial/Assessment Note    Patient Details  Name: Howard [name] MRN: [id number] Date of Birth: [birth year]  Transition of Care [facility]) CM/SW Contact:    Elza Rafter, RN Phone Number: 09/02/2019, 2:38 PM  Clinical Narrative:     Patient readmitted with decreased urination and abdominal pain.  Readmission from 08-28-19 discharge after renal stents placed.  Currently pending PT evaluation to determine discharge disposition.  Has a walker and a cane at home.  [facility] is accepting patient for home health if discharged home.  Wife at bedside.  Eliquis 30 day free coupon provided.  Obtains medications through [facility] without difficulty.  Current with PCP.  Will continue to follow and assist with discharge disposition.               Expected Discharge Plan: [place] Barriers to Discharge: Continued Medical Work up   Patient Goals and CMS Choice Patient states their goals for this hospitalization and ongoing recovery are:: discharge home CMS Medicare.gov Compare Post Acute Care list provided to:: Patient Choice offered to / list presented to : Patient  Expected Discharge Plan and Services Expected Discharge Plan: [place]   Discharge Planning Services: CM Consult   Living arrangements for the past 2 months: Single Family Home Expected Discharge Date: 09/01/19                         HH Arranged: RN, PT, Nurse's Aide HH Agency: [place] Date [place]: 09/02/19 Time [place]: 1437 Representative spoke with at [place]: [place] Arrangements/Services Living arrangements for the past 2 months: [place] with:: Spouse Patient language and need for interpreter reviewed:: Yes Do you feel safe going back to the place where you live?: Yes            Criminal Activity/Legal Involvement Pertinent to Current Situation/Hospitalization: No -  Comment as needed  Activities of Daily Living Home Assistive Devices/Equipment: Crutches ADL Screening (condition at time of admission) Patient's cognitive ability adequate to safely complete daily activities?: Yes Is the patient deaf or have difficulty hearing?: No Does the patient have difficulty seeing, even when wearing glasses/contacts?: No Does the patient have difficulty concentrating, remembering, or making decisions?: No Patient able to express need for assistance with ADLs?: Yes Does the patient have difficulty dressing or bathing?: No Independently performs ADLs?: Yes (appropriate for developmental age) Does the patient have difficulty walking or climbing stairs?: Yes Weakness of Legs: Both Weakness of Arms/Hands: None  Permission Sought/Granted   Permission granted to share information with : Yes, Verbal Permission Granted  Share Information with NAME: Judson Roch  Permission granted to share info w AGENCY: [facility]        Emotional Assessment Appearance:: Appears stated age Attitude/Demeanor/Rapport: Gracious Affect (typically observed): Accepting Orientation: : Oriented to Self, Oriented to Place, Oriented to  Time, Oriented to Situation Alcohol / Substance Use: Not Applicable Psych Involvement: No (comment)  Admission diagnosis:  Splenic infarct [D73.5] Patient Active Problem List   Diagnosis Date Noted  . Thromboembolism ([place]) 08/30/2019  . Atherosclerosis of native arteries of extremity with intermittent claudication ([place]) 08/23/2019  . Cellulitis 08/22/2019  . Swelling of limb 08/17/2019  . Pain in limb 08/17/2019  . NICM (nonischemic cardiomyopathy) ([place]) 07/01/2019  . CHF (congestive heart failure) ([place]) 07/01/2019  . Malignant neoplasm of right lung ([place])  04/18/2016  . CAD (coronary artery disease) 04/01/2016  . Adenocarcinoma ([place]) 04/01/2016  . Skin cyst 12/26/2015  . GERD (gastroesophageal reflux disease) 06/16/2015  . Abscess of back  06/06/2015  . Vertigo 03/27/2015  . Carpal tunnel syndrome 10/28/2014  . Status post cholecystectomy 09/29/2014  . Disease of digestive tract 09/29/2014  . Cardiomyopathy ([place]) 09/26/2014  . Type 2 diabetes mellitus without complications ([place])   . Sleep apnea   . Spinal stenosis, lumbar region, with neurogenic claudication 06/07/2014  . Lumbar stenosis with neurogenic claudication 06/07/2014  . Abnormal gait 08/20/2012  . H/O total knee replacement 08/20/2012  . Arthritis of knee, degenerative 08/20/2012  . Pacemaker-St.Jude 08/03/2012  . Cardiac conduction disorder 06/19/2012  . Acid reflux 06/18/2012  . 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 Triggering of digit 03/25/2012  . Hyperlipidemia 12/23/2011  . Essential hypertension 03/23/2011  . Atrioventricular block, complete ([place]) 03/23/2011  . Complete atrioventricular block ([place]) 03/23/2011   PCP:  Margo Common, PA Pharmacy:   [place], [place] - [address] [place] [address] [place] [address] Phone: [phone] Fax: [phone]  [place], [place] - [address] [place] [address] Phone: [phone] Fax: [phone]  [facility] [place] [place] [facility] [address] [place] [address] Phone: [phone] Fax: [phone]     Social Determinants of Health (SDOH) Interventions    Readmission Risk Interventions Readmission Risk Prevention Plan 09/02/2019 08/28/2019  Transportation Screening Complete Complete  PCP or Specialist Appt within 5-7 Days - Complete  PCP or Specialist Appt within 3-5 Days Complete -  Home Care Screening - Complete  Medication Review (RN CM) - Complete  HRI or Home Care Consult Complete -  Social Work Consult for Recovery Care Planning/Counseling Complete -  Palliative Care Screening Not Applicable -  Medication Review (RN Care Manager)  Complete -  Some recent data might be hidden

## 2019-09-02 NOTE — Plan of Care (Signed)
  Problem: Education: Goal: Understanding of CV disease, CV risk reduction, and recovery process will improve Outcome: Adequate for Discharge Goal: Individualized Educational Video(s) Outcome: Adequate for Discharge   Problem: Activity: Goal: Ability to return to baseline activity level will improve Outcome: Adequate for Discharge   Problem: Cardiovascular: Goal: Ability to achieve and maintain adequate cardiovascular perfusion will improve Outcome: Adequate for Discharge Goal: Vascular access site(s) Level 0-1 will be maintained Outcome: Adequate for Discharge   Problem: Health Behavior/Discharge Planning: Goal: Ability to safely manage health-related needs after discharge will improve Outcome: Adequate for Discharge   Problem: Education: Goal: Knowledge of General Education information will improve Description: Including pain rating scale, medication(s)/side effects and non-pharmacologic comfort measures Outcome: Adequate for Discharge   Problem: Health Behavior/Discharge Planning: Goal: Ability to manage health-related needs will improve Outcome: Adequate for Discharge   Problem: Clinical Measurements: Goal: Ability to maintain clinical measurements within normal limits will improve Outcome: Adequate for Discharge Goal: Will remain free from infection Outcome: Adequate for Discharge Goal: Diagnostic test results will improve Outcome: Adequate for Discharge Goal: Respiratory complications will improve Outcome: Adequate for Discharge Goal: Cardiovascular complication will be avoided Outcome: Adequate for Discharge   Problem: Activity: Goal: Risk for activity intolerance will decrease Outcome: Adequate for Discharge   Problem: Nutrition: Goal: Adequate nutrition will be maintained Outcome: Adequate for Discharge   Problem: Coping: Goal: Level of anxiety will decrease Outcome: Adequate for Discharge   Problem: Elimination: Goal: Will not experience complications  related to bowel motility Outcome: Adequate for Discharge Goal: Will not experience complications related to urinary retention Outcome: Adequate for Discharge   Problem: Pain Managment: Goal: General experience of comfort will improve Outcome: Adequate for Discharge   Problem: Safety: Goal: Ability to remain free from injury will improve Outcome: Adequate for Discharge   Problem: Skin Integrity: Goal: Risk for impaired skin integrity will decrease Outcome: Adequate for Discharge   Problem: Consults Goal: Venous Thromboembolism Patient Education Description: See Patient Education Module for education specifics. Outcome: Adequate for Discharge Goal: Diagnosis - Venous Thromboembolism (VTE) Description: Choose a selection Outcome: Adequate for Discharge Goal: Pharmacy Consult for anticoagulation Outcome: Adequate for Discharge Goal: Skin Care Protocol Initiated - if Braden Score 18 or less Description: If consults are not indicated, leave blank or document N/A Outcome: Adequate for Discharge Goal: Nutrition Consult-if indicated Outcome: Adequate for Discharge Goal: Diabetes Guidelines if Diabetic/Glucose > 140 Description: If diabetic or lab glucose is > 140 mg/dl - Initiate Diabetes/Hyperglycemia Guidelines & Document Interventions  Outcome: Adequate for Discharge   Problem: Phase I Progression Outcomes Goal: Pain controlled with appropriate interventions Outcome: Adequate for Discharge Goal: Dyspnea controlled at rest (PE) Outcome: Adequate for Discharge Goal: Tolerating diet Outcome: Adequate for Discharge Goal: Initial discharge plan identified Outcome: Adequate for Discharge Goal: Voiding-avoid urinary catheter unless indicated Outcome: Adequate for Discharge Goal: Hemodynamically stable Outcome: Adequate for Discharge Goal: Other Phase I Outcomes/Goals Outcome: Adequate for Discharge   Problem: Discharge Progression Outcomes Goal: Barriers To Progression  Addressed/Resolved Outcome: Adequate for Discharge Goal: Discharge plan in place and appropriate Outcome: Adequate for Discharge Goal: Pain controlled with appropriate interventions Outcome: Adequate for Discharge Goal: Hemodynamically stable Outcome: Adequate for Discharge Goal: Complications resolved/controlled Outcome: Adequate for Discharge Goal: Tolerating diet Outcome: Adequate for Discharge Goal: Activity appropriate for discharge plan Outcome: Adequate for Discharge Goal: Other Discharge Outcomes/Goals Outcome: Adequate for Discharge

## 2019-09-02 NOTE — Progress Notes (Signed)
Progress Note  Patient Name: Howard [name] Date of Encounter: 09/02/2019  Primary Cardiologist: [facility]  Subjective   Feels well today, has worked with PT, would like to go home Wife at the bedside He does feel weak but feels he can recover with her assistance at home  Inpatient Medications    Scheduled Meds:  apixaban  5 mg Oral BID   aspirin EC  81 mg Oral Daily   cholecalciferol  1,000 Units Oral Daily   fentaNYL  1 patch Transdermal Q72H   isosorbide mononitrate  30 mg Oral Daily   metFORMIN  500 mg Oral BID WC   metoprolol succinate  25 mg Oral QHS   multivitamin with minerals  1 tablet Oral Daily   pantoprazole  40 mg Oral Daily   senna  1 tablet Oral BID   simvastatin  40 mg Oral QHS   sodium chloride flush  10 mL Intravenous Q12H   sodium chloride  1 g Oral BID WC   Continuous Infusions:  PRN Meds: acetaminophen **OR** acetaminophen, alum & mag hydroxide-simeth, bisacodyl, fluticasone, hydrALAZINE, labetalol, metoprolol tartrate, montelukast, ondansetron, oxyCODONE, polyethylene glycol, sodium phosphate   Vital Signs    Vitals:   09/01/19 1929 09/02/19 0329 09/02/19 0744 09/02/19 1531  BP: (!) 106/56 (!) 121/56 140/62 138/68  Pulse: 96 79 75 82  Resp: 20  19 19   Temp: 99.2 F (37.3 C) 97.9 F (36.6 C) 97.7 F (36.5 C)   TempSrc: Oral Oral Oral   SpO2: 94% 93% 96% 96%  Weight:  83.9 kg    Height:        Intake/Output Summary (Last 24 hours) at 09/02/2019 1833 Last data filed at 09/02/2019 0854 Gross per 24 hour  Intake 3 ml  Output 450 ml  Net -447 ml   Last 3 Weights 09/02/2019 09/01/2019 09/01/2019  Weight (lbs) 185 lb 186 lb 8.2 oz 186 lb 8 oz  Weight (kg) 83.915 kg 84.6 kg 84.596 kg      Telemetry    Normal sinus rhythm- Personally Reviewed  ECG    - Personally Reviewed  Physical Exam   GEN: No acute distress.   Neck: No JVD Cardiac: RRR, no murmurs, rubs, or gallops.  Respiratory: Clear to auscultation bilaterally. GI:  Soft, nontender, non-distended  MS: No edema; No deformity. Neuro:  Nonfocal  Psych: Normal affect   Labs    High Sensitivity Troponin:  No results for input(s): TROPONINIHS in the last 720 hours.    Chemistry Recent Labs  Lab 08/31/19 0457 09/01/19 0558 09/02/19 0517  NA 130* 133* 129*  K 3.7 4.1 4.4  CL 99 102 101  CO2 20* 19* 19*  GLUCOSE 136* 134* 121*  BUN 24* 14 12  CREATININE 1.05 0.98 1.02  CALCIUM 8.3* 8.6* 8.4*  PROT 6.1*  --   --   ALBUMIN 3.1*  --   --   AST 22  --   --   ALT 17  --   --   ALKPHOS 41  --   --   BILITOT 0.7  --   --   GFRNONAA >60 >60 >60  GFRAA >60 >60 >60  ANIONGAP 11 12 9      Hematology Recent Labs  Lab 08/31/19 0457 09/01/19 0558 09/02/19 0517  WBC 5.6 6.2 7.2  RBC 3.88* 3.94* 3.73*  HGB 10.4* 10.5* 10.1*  HCT 31.2* 31.5* 29.8*  MCV 80.4 79.9* 79.9*  MCH 26.8 26.6 27.1  MCHC 33.3 33.3 33.9  RDW 13.8 13.7 13.7  PLT 266 297 291    BNPNo results for input(s): BNP, PROBNP in the last 168 hours.   DDimer No results for input(s): DDIMER in the last 168 hours.   Radiology    No results found.  Cardiac Studies   TEE  1. The left ventricle has low normal systolic function, with an ejection fraction of 50-55%. The cavity size was normal. There is mildly increased left ventricular wall thickness. Left ventricular diastolic Doppler parameters are consistent with  indeterminate diastolic dysfunction. Indeterminate filling pressures No evidence of left ventricular regional wall motion abnormalities.  2. The right ventricle has normal systolc function. There is no increase in right ventricular wall thickness.  3. Left atrial size was moderately dilated.  4. No evidence of a thrombus present in the left atrium or left atrial appendage.  5. Evidence of atrial level shunting detected by color flow Doppler, PFO present, saline contrast bubble study positive for PFO, small number of bubbles crossing at rest, unable to test with valsalva   6. Moderate plaque in the descending aorta and aortic arch.  Patient Profile     85 year old gentleman with history of nonobstructive coronary disease, nonischemic cardiomyopathy, WPW, complete heart block with pacemaker recent change out July 2020, aortic atherosclerosis,  Admitted to the hospital August 30, 2019 with abdominal pain, CT scan confirming splenic infarct, right renal artery malperfusion  Assessment & Plan    A/P: Thromboembolism, splenic infarct TEE with no evidence of left atrial thrombus or left atrial appendage thrombus -Agree with Eliquis, aspirin significant aortic atherosclerosis TEE results discussed with him in detail and wife at the bedside -Pacer was downloaded yesterday, no arrhythmia noted, device functioning well --Continue high intensity statin,  simvastatin daily.  Goal LDL less than 70.  Previously LDL 62 1 year ago  PAD Severe aortic atherosclerosis, Recent vascular intervention as detailed above Eliquis with low-dose aspirin -Aggressive lipid management  Complete heart block Pacemaker, followed by Dr. Liberty Handy device July 2020, working appropriately -Will discuss if additional work-up is needed to rule out atrial arrhythmia whether this can be done through periodic monitoring of pacer   Total encounter time more than 25 minutes  Greater than 50% was spent in counseling and coordination of care with the patient    [facility] will sign off.   Medication Recommendations:  No chnages Other recommendations (labs, testing, etc):  none Follow up as an outpatient:  With Dr. Caryl Comes  For questions or updates, please contact [facility] Please consult [email or web address] for contact info under        Signed, [name], MD  09/02/2019, 6:33 PM

## 2019-09-03 NOTE — TOC Transition Note (Signed)
Transition of Care [facility]) - CM/SW Discharge Note   Patient Details  Name: Howard [name] MRN: [id number] Date of Birth: 29-Apr-1931  Transition of Care [facility]) CM/SW Contact:  Elza Rafter, RN Phone Number: 09/03/2019, 9:58 AM   Clinical Narrative:   Late entry-Patient discharged last evening around 6:30 with wife.  PT recommended home health PT.  Notified Judson Roch this morning with [facility].  Patient will need RN, PT.      Final next level of care: [place] Barriers to Discharge: Continued Medical Work up   Patient Goals and CMS Choice Patient states their goals for this hospitalization and ongoing recovery are:: discharge home CMS Medicare.gov Compare Post Acute Care list provided to:: Patient Choice offered to / list presented to : Patient  Discharge Placement                       Discharge Plan and Services   Discharge Planning Services: CM Consult                      HH Arranged: RN, PT, Nurse's Aide [place] Agency: [facility] [address] [facility] [address] Date [place]: 09/02/19 Time [place]: 1437 Representative spoke with at [place]: [place] Determinants of Health ([place]) Interventions     Readmission Risk Interventions Readmission Risk Prevention Plan 09/02/2019 08/28/2019  Transportation Screening Complete Complete  PCP or Specialist Appt within 5-7 Days - Complete  PCP or Specialist Appt within 3-5 Days Complete -  Home Care Screening - Complete  Medication Review (RN CM) - Complete  HRI or Home Care Consult Complete -  Social Work Consult for [place] Planning/Counseling Complete -  Palliative Care Screening Not Applicable -  Medication Review Press photographer) Complete -  Some recent data might be hidden

## 2019-09-04 DIAGNOSIS — I2510 Atherosclerotic heart disease of native coronary artery without angina pectoris: Secondary | ICD-10-CM | POA: Diagnosis not present

## 2019-09-04 DIAGNOSIS — Z9862 Peripheral vascular angioplasty status: Secondary | ICD-10-CM | POA: Diagnosis not present

## 2019-09-04 DIAGNOSIS — M1990 Unspecified osteoarthritis, unspecified site: Secondary | ICD-10-CM | POA: Diagnosis not present

## 2019-09-04 DIAGNOSIS — Z48812 Encounter for surgical aftercare following surgery on the circulatory system: Secondary | ICD-10-CM | POA: Diagnosis not present

## 2019-09-04 DIAGNOSIS — E785 Hyperlipidemia, unspecified: Secondary | ICD-10-CM | POA: Diagnosis not present

## 2019-09-04 DIAGNOSIS — Z950 Presence of cardiac pacemaker: Secondary | ICD-10-CM | POA: Diagnosis not present

## 2019-09-04 DIAGNOSIS — I110 Hypertensive heart disease with heart failure: Secondary | ICD-10-CM | POA: Diagnosis not present

## 2019-09-04 DIAGNOSIS — Z7902 Long term (current) use of antithrombotics/antiplatelets: Secondary | ICD-10-CM | POA: Diagnosis not present

## 2019-09-04 DIAGNOSIS — Z7984 Long term (current) use of oral hypoglycemic drugs: Secondary | ICD-10-CM | POA: Diagnosis not present

## 2019-09-04 DIAGNOSIS — I5042 Chronic combined systolic (congestive) and diastolic (congestive) heart failure: Secondary | ICD-10-CM | POA: Diagnosis not present

## 2019-09-04 DIAGNOSIS — I70293 Other atherosclerosis of native arteries of extremities, bilateral legs: Secondary | ICD-10-CM | POA: Diagnosis not present

## 2019-09-04 DIAGNOSIS — F329 Major depressive disorder, single episode, unspecified: Secondary | ICD-10-CM | POA: Diagnosis not present

## 2019-09-04 DIAGNOSIS — I428 Other cardiomyopathies: Secondary | ICD-10-CM | POA: Diagnosis not present

## 2019-09-04 DIAGNOSIS — I456 Pre-excitation syndrome: Secondary | ICD-10-CM | POA: Diagnosis not present

## 2019-09-04 DIAGNOSIS — C3491 Malignant neoplasm of unspecified part of right bronchus or lung: Secondary | ICD-10-CM | POA: Diagnosis not present

## 2019-09-04 DIAGNOSIS — E1151 Type 2 diabetes mellitus with diabetic peripheral angiopathy without gangrene: Secondary | ICD-10-CM | POA: Diagnosis not present

## 2019-09-06 NOTE — Progress Notes (Signed)
Howard [name]  MRN: [id number] DOB: [birth year]  Subjective:  HPI   Patient is an 85 year old male who presents for follow up from hospitalization.  He was 1 week post op and presented to the hospital with complaint of urinary retention for 2 days.  At the time he had not been eating, had nausea and denied any abdominal pain despite stating he had been 1 week without a bowel movement.  He reported that he was taking OTC medication for the bowels.  Patient is starting to eat more.  His bowels are starting to move better and his urinating has improved  Patient Active Problem List   Diagnosis Date Noted  . Thromboembolism ([place]) 08/30/2019  . Atherosclerosis of native arteries of extremity with intermittent claudication ([place]) 08/23/2019  . Cellulitis 08/22/2019  . Swelling of limb 08/17/2019  . Pain in limb 08/17/2019  . NICM (nonischemic cardiomyopathy) ([place]) 07/01/2019  . CHF (congestive heart failure) ([place]) 07/01/2019  . Malignant neoplasm of right lung ([place]) 04/18/2016  . CAD (coronary artery disease) 04/01/2016  . Adenocarcinoma ([place]) 04/01/2016  . Skin cyst 12/26/2015  . GERD (gastroesophageal reflux disease) 06/16/2015  . Abscess of back 06/06/2015  . Vertigo 03/27/2015  . Carpal tunnel syndrome 10/28/2014  . Status post cholecystectomy 09/29/2014  . Disease of digestive tract 09/29/2014  . Cardiomyopathy ([place]) 09/26/2014  . Type 2 diabetes mellitus without complications ([place])   . Sleep apnea   . Spinal stenosis, lumbar region, with neurogenic claudication 06/07/2014  . Lumbar stenosis with neurogenic claudication 06/07/2014  . Abnormal gait 08/20/2012  . H/O total knee replacement 08/20/2012  . Arthritis of knee, degenerative 08/20/2012  . Pacemaker-St.Jude 08/03/2012  . Cardiac conduction disorder 06/19/2012  . Acid reflux 06/18/2012  . 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 Triggering of digit 03/25/2012  . Hyperlipidemia 12/23/2011  . Essential hypertension 03/23/2011   . Atrioventricular block, complete ([place]) 03/23/2011  . Complete atrioventricular block ([place]) 03/23/2011    Past Medical History:  Diagnosis Date  . Arthritis   . Bell palsy   . Cancer [facility])    prostate and skin  . Chronic combined systolic and diastolic CHF, NYHA class 1 ([place])    a. 07/2014 Echo: EF 35-40%, Gr 1 DD.  Marland Kitchen Complete heart block ([place])    a. 11/2010 s/p SJM 2210 Accent DC PPM, ser# [id number].  . Depression   . Diabetes mellitus without complication ([place])   . Fall 11-10-14  . GERD (gastroesophageal reflux disease)   . History of prostate cancer   . Hyperlipidemia   . Hypertension   . LBBB (left bundle branch block)   . Left-sided Bell's palsy   . Lung cancer ([place]) 2016  . NICM (nonischemic cardiomyopathy) ([place])    a. 07/2014 Echo: EF 35-40%, mid-apicalanteroseptal DK, Gr 1 DD, mild-mod dil LA.  . Non-obstructive CAD    a. 07/2014 Abnl MV;  b. 08/2014 Cath: LM nl, LAD 30p, RI 40p, LCX nl, OM1 40, RCA dominant 30p, 70d-->Med Rx.  Marland Kitchen Poor balance   . Presence of permanent cardiac pacemaker   . Sleep apnea    a. cpap  . Vertigo   . WPW (Wolff-Parkinson-White syndrome)    a. S/P RFCA 1991.   Past Surgical History:  Procedure Laterality Date  . ABDOMINAL AORTIC ENDOVASCULAR STENT GRAFT  08/25/2019   Procedure: ABDOMINAL AORTIC ENDOVASCULAR STENT GRAFT;  Surgeon: Algernon Huxley, MD;  Location: [facility] ORS;  Service: Vascular;;  . ANGIOPLASTY Left 08/25/2019  Procedure: ANGIOPLASTY;  Surgeon: Algernon Huxley, MD;  Location: [facility] ORS;  Service: Vascular;  Laterality: Left;  left SFA and stent placement  . APPLICATION OF WOUND VAC Left 06/07/2015   Procedure: APPLICATION OF WOUND VAC;  Surgeon: Robert Bellow, MD;  Location: [facility] ORS;  Service: General;  Laterality: Left;  left upper back  . BACK SURGERY     2011  . CARDIAC CATHETERIZATION  08/26/2014   Single vessel obstructive CAD  . CARPAL TUNNEL RELEASE  04-04-15   [facility]  . CATARACT EXTRACTION  07-31-11 and 09-18-11  . Catheter  ablation  1991   for WPW  . cervical fusion    . CHOLECYSTECTOMY  09-07-14  . ENDARTERECTOMY FEMORAL Left 08/25/2019   Procedure: ENDARTERECTOMY FEMORAL;  Surgeon: Algernon Huxley, MD;  Location: [facility] ORS;  Service: Vascular;  Laterality: Left;  common and produndis   . ENDOVASCULAR REPAIR/STENT GRAFT Right 08/25/2019   Procedure: ENDOVASCULAR REPAIR/STENT GRAFT;  Surgeon: Algernon Huxley, MD;  Location: [facility] ORS;  Service: Vascular;  Laterality: Right;  renal artery  . HAND SURGERY     right 1993; left 2005  . HERNIA REPAIR  1955  . INSERT / REPLACE / REMOVE PACEMAKER    . INSERTION OF ILIAC STENT Bilateral 08/25/2019   Procedure: INSERTION OF ILIAC STENT;  Surgeon: Algernon Huxley, MD;  Location: [facility] ORS;  Service: Vascular;  Laterality: Bilateral;  . JOINT REPLACEMENT Left 2013   knee  . JOINT REPLACEMENT Right 2004   knee  . KNEE SURGERY     left knee 1991 and 1992; right knee 1995  . LEFT HEART CATHETERIZATION WITH CORONARY ANGIOGRAM N/A 08/26/2014   Procedure: LEFT HEART CATHETERIZATION WITH CORONARY ANGIOGRAM;  Surgeon: [name], MD;  Location: [facility] CATH LAB;  Service: Cardiovascular;  Laterality: N/A;  . LOWER EXTREMITY ANGIOGRAPHY Left 08/23/2019   Procedure: Lower Extremity Angiography;  Surgeon: Algernon Huxley, MD;  Location: [facility] CV LAB;  Service: Cardiovascular;  Laterality: Left;  . LUMBAR LAMINECTOMY/DECOMPRESSION MICRODISCECTOMY N/A 06/07/2014   Procedure: LUMBAR FOUR TO FIVE LUMBAR LAMINECTOMY/DECOMPRESSION MICRODISCECTOMY 1 LEVEL;  Surgeon: [name], MD;  Location: [facility] NEURO ORS;  Service: Neurosurgery;  Laterality: N/A;  . LUNG BIOPSY Right 2016   Dr [name]  . PACEMAKER INSERTION     PPM-- St Jude 11/30/10 by [name]  . PPM GENERATOR CHANGEOUT N/A 07/09/2019   Procedure: PPM GENERATOR CHANGEOUT;  Surgeon: Deboraha Sprang, MD;  Location: [facility] CV LAB;  Service: Cardiovascular;  Laterality: N/A;  . PROSTATE SURGERY     cancer--1998, prostatectomy  . REPLACEMENT TOTAL KNEE      2004  . ruptured disc     1962 and 1998  . TEE WITHOUT CARDIOVERSION N/A 09/01/2019   Procedure: TRANSESOPHAGEAL ECHOCARDIOGRAM (TEE);  Surgeon: [name], MD;  Location: [facility] ORS;  Service: Cardiovascular;  Laterality: N/A;  . TEMPORARY PACEMAKER N/A 07/09/2019   Procedure: TEMPORARY PACEMAKER;  Surgeon: Deboraha Sprang, MD;  Location: [facility] CV LAB;  Service: Cardiovascular;  Laterality: N/A;  . TRIGGER FINGER RELEASE  01-24-15  . WOUND DEBRIDEMENT Left 06/07/2015   Procedure: DEBRIDEMENT WOUND;  Surgeon: Robert Bellow, MD;  Location: [facility] ORS;  Service: General;  Laterality: Left;  left upper back   Family History  Problem Relation Age of Onset  . Heart attack Mother   . Hyperlipidemia Mother   . CAD Other   . Prostate cancer Neg Hx    Social History  Socioeconomic History  . Marital status: Married    Spouse name: Not on file  . Number of children: 2  . Years of education: College  . Highest education level: Some college, no degree  Occupational History  . Occupation: Retired  Scientific laboratory technician  . Financial resource strain: Not hard at all  . Food insecurity    Worry: Never true    Inability: Never true  . Transportation needs    Medical: No    Non-medical: No  Tobacco Use  . Smoking status: Former Smoker    Years: 4.00  . Smokeless tobacco: Never Used  . Tobacco comment: Quit 2011  Substance and Sexual Activity  . Alcohol use: No    Frequency: Never  . Drug use: No  . Sexual activity: Not on file  Lifestyle  . Physical activity    Days per week: 0 days    Minutes per session: 0 min  . Stress: Not at all  Relationships  . Social Herbalist on phone: Patient refused    Gets together: Patient refused    Attends religious service: Patient refused    Active member of club or organization: Patient refused    Attends meetings of clubs or organizations: Patient refused    Relationship status: Patient refused  . Intimate partner violence     Fear of current or ex partner: Patient refused    Emotionally abused: Patient refused    Physically abused: Patient refused    Forced sexual activity: Patient refused  Other Topics Concern  . Not on file  Social History Narrative   Drinks 2 cups of coffee a day    Outpatient Encounter Medications as of 09/07/2019  Medication Sig  . apixaban (ELIQUIS) 5 MG TABS tablet Take 1 tablet (5 mg total) by mouth 2 (two) times daily.  Marland Kitchen aspirin EC 81 MG tablet Take 81 mg by mouth daily.  . Cholecalciferol (VITAMIN D3) 1000 UNITS CAPS Take 1,000 Units by mouth daily.   . fluticasone (FLONASE) 50 MCG/ACT nasal spray Place 2 sprays into both nostrils daily.  . isosorbide mononitrate (IMDUR) 30 MG 24 hr tablet TAKE 1 TABLET BY MOUTH DAILY (Patient taking differently: Take 30 mg by mouth daily. )  . metFORMIN (GLUCOPHAGE) 500 MG tablet Take 1 tablet (500 mg total) by mouth 2 (two) times daily with a meal.  . metoprolol succinate (TOPROL-XL) 25 MG 24 hr tablet Take 1 tablet (25 mg) by mouth once daily at bedtime (Patient taking differently: Take 25 mg by mouth at bedtime. )  . Multiple Vitamin (MULTIVITAMIN WITH MINERALS) TABS tablet Take 1 tablet by mouth daily. One-A-Day Multivitamin  . omeprazole (PRILOSEC) 40 MG capsule Take 1 capsule (40 mg total) by mouth 2 (two) times daily as needed. (Patient taking differently: Take 40 mg by mouth 2 (two) times a day. )  . sacubitril-valsartan (ENTRESTO) 24-26 MG Take 1 tablet by mouth 2 (two) times daily.  . simvastatin (ZOCOR) 40 MG tablet TAKE 1 TABLET AT BEDTIME (Patient taking differently: Take 40 mg by mouth at bedtime. )  . spironolactone (ALDACTONE) 50 MG tablet Take 1 tablet (50 mg) by mouth once daily in the morning (Patient taking differently: Take 50 mg by mouth every morning. )  . fentaNYL (DURAGESIC) 12 MCG/HR Place 1 patch onto the skin every 3 (three) days.  Marland Kitchen HYDROcodone-acetaminophen (NORCO) 5-325 MG tablet Take 1 tablet by mouth every 6 (six) hours  as needed for moderate pain. (Patient not  taking: Reported on 09/06/2019)  . montelukast (SINGULAIR) 10 MG tablet Take 10 mg by mouth daily as needed (sneezing/allergies.).   Marland Kitchen mupirocin ointment (BACTROBAN) 2 % Apply to affected area 3 times daily (Patient not taking: Reported on 08/30/2019)   No facility-administered encounter medications on file as of 09/07/2019.    Allergies  Allergen Reactions  . Sulfa Antibiotics Rash   Review of Systems  Constitutional: Negative for fever.  HENT: Negative for congestion, ear pain, sinus pain and sore throat.   Respiratory: Negative for cough, shortness of breath and wheezing.   Cardiovascular: Negative for chest pain.  Gastrointestinal: Negative for abdominal pain, constipation, diarrhea, nausea and vomiting.    Objective:  BP (!) 142/58 (BP Location: Right Arm, Patient Position: Sitting, Cuff Size: Normal)   Pulse 83   Temp (!) 96.8 F (36 C) (Oral)   Wt 183 lb (83 kg)   SpO2 99%   BMI 27.02 kg/m  Wt Readings from Last 3 Encounters:  09/07/19 183 lb (83 kg)  09/02/19 185 lb (83.9 kg)  08/25/19 188 lb 7.9 oz (85.5 kg)   Physical Exam  Constitutional: He is oriented to person, place, and time and well-developed, well-nourished, and in no distress.  HENT:  Head: Normocephalic.  Eyes: Conjunctivae are normal.  Neck: Neck supple.  Cardiovascular: Normal rate.  Pacemaker in left upper chest for complete heart block. Improved capillary refill bilateral lower legs.  Pulmonary/Chest: Effort normal and breath sounds normal.  Abdominal: Soft. Bowel sounds are normal.  Healing surgical scars in both groins from recent vascular surgery.  Musculoskeletal: Normal range of motion.  Neurological: He is alert and oriented to person, place, and time.  Numbness in the plantar surface of the left foot.  Skin: No rash noted.  Psychiatric: Mood, affect and judgment normal.    Assessment and Plan :  1. Atherosclerosis of native artery of left lower  extremity with intermittent claudication [facility]) This 85 year old male developed claudication with slow capillary refill and pallor of the left lower leg in early August 2020. Was evaluated by vascular surgeon and found to have severe atherosclerotic changes and early ischemic changes in the first toe of the left foot and went to [facility] on 08-21-19. Vascular surgery did a bilateral iliac stent placement and aortic stent graft placement and right renal stent placement.  The patient had better blood supply in his lower extremities and his pain in the left first toe disappeared.  The patient was on IV heparin initially and then switched over to aspirin and Plavix after the procedures. He was discharged home 08-28-19 with antibiotics for the cellulitis in the toe. He was readmitted on 08-30-19 with acute abdominal pain. CT demonstrated R renal artery malperfusion and L splenic infarction. He was discharged on 09-02-19 with great improvement with IV rehydration and switch of Plavix to ASA with Eliquis. Sensation in the left leg is improved and no further pain. Will recheck labs and encouraged to follow up with vascular surgeon (Dr. Lucky Cowboy) tomorrow for follow up ultrasound/doppler study. - CBC with Differential/Platelet - Comprehensive metabolic panel  2. Cardiac conduction disorder Had a "new" battery put in his pacemaker while hospitalized. No chest pains, dyspnea or palpitations. History of complete block requiring constant pacing. Followed by cardiologist (Dr. [name]) regularly.  3. Congestive heart failure, unspecified HF chronicity, unspecified heart failure type ([place]) Presently on Entresto 24-26 mg BID and Spironolactone 50 mg qd. No peripheral edema today. TEE on 09-01-19 showed EF 50-55%. Lungs clear.  Recheck routine labs and continue to monitor weight. - CBC with Differential/Platelet - Comprehensive metabolic panel  4. Hyponatremia Sodium 129 at discharge on 09-02-19. Energy level improving. Not drinking too  much fluids. Will recheck CMP. - Comprehensive metabolic panel  5. Splenic infarct No abdominal pain today and nausea has resolved with vascular procedures to restore circulation. - CBC with Differential/Platelet - Comprehensive metabolic panel  6. Type 2 diabetes mellitus without complication, without long-term current use of insulin ([place]) During hospitalization, he was given some insulin but back on Metformin 500 mg BID now. No polyuria or polydipsia. Some numbness in the plantar surface of the left foot. Will recheck CBC, CMP and Hgb A1C.  - CBC with Differential/Platelet - Comprehensive metabolic panel - Hemoglobin A1c

## 2019-09-07 ENCOUNTER — Other Ambulatory Visit: Payer: Self-pay

## 2019-09-07 ENCOUNTER — Other Ambulatory Visit (INDEPENDENT_AMBULATORY_CARE_PROVIDER_SITE_OTHER): Payer: Self-pay | Admitting: Vascular Surgery

## 2019-09-07 ENCOUNTER — Other Ambulatory Visit: Payer: Self-pay | Admitting: Family Medicine

## 2019-09-07 ENCOUNTER — Ambulatory Visit (INDEPENDENT_AMBULATORY_CARE_PROVIDER_SITE_OTHER): Payer: Medicare Other | Admitting: Family Medicine

## 2019-09-07 ENCOUNTER — Encounter: Payer: Self-pay | Admitting: Family Medicine

## 2019-09-07 VITALS — BP 142/58 | HR 83 | Temp 96.8°F | Wt 183.0 lb

## 2019-09-07 DIAGNOSIS — I714 Abdominal aortic aneurysm, without rupture, unspecified: Secondary | ICD-10-CM

## 2019-09-07 DIAGNOSIS — I509 Heart failure, unspecified: Secondary | ICD-10-CM

## 2019-09-07 DIAGNOSIS — I459 Conduction disorder, unspecified: Secondary | ICD-10-CM | POA: Diagnosis not present

## 2019-09-07 DIAGNOSIS — E871 Hypo-osmolality and hyponatremia: Secondary | ICD-10-CM | POA: Diagnosis not present

## 2019-09-07 DIAGNOSIS — E119 Type 2 diabetes mellitus without complications: Secondary | ICD-10-CM | POA: Diagnosis not present

## 2019-09-07 DIAGNOSIS — I70212 Atherosclerosis of native arteries of extremities with intermittent claudication, left leg: Secondary | ICD-10-CM

## 2019-09-07 DIAGNOSIS — D735 Infarction of spleen: Secondary | ICD-10-CM | POA: Diagnosis not present

## 2019-09-07 DIAGNOSIS — Z95820 Peripheral vascular angioplasty status with implants and grafts: Secondary | ICD-10-CM

## 2019-09-07 DIAGNOSIS — I771 Stricture of artery: Secondary | ICD-10-CM

## 2019-09-08 ENCOUNTER — Encounter (INDEPENDENT_AMBULATORY_CARE_PROVIDER_SITE_OTHER): Payer: Self-pay | Admitting: Nurse Practitioner

## 2019-09-08 ENCOUNTER — Ambulatory Visit (INDEPENDENT_AMBULATORY_CARE_PROVIDER_SITE_OTHER): Payer: Medicare Other

## 2019-09-08 ENCOUNTER — Other Ambulatory Visit: Payer: Self-pay

## 2019-09-08 ENCOUNTER — Ambulatory Visit (INDEPENDENT_AMBULATORY_CARE_PROVIDER_SITE_OTHER): Payer: Medicare Other | Admitting: Nurse Practitioner

## 2019-09-08 VITALS — BP 131/69 | HR 71 | Resp 14 | Ht 69.5 in | Wt 180.0 lb

## 2019-09-08 DIAGNOSIS — I771 Stricture of artery: Secondary | ICD-10-CM

## 2019-09-08 DIAGNOSIS — K2100 Gastro-esophageal reflux disease with esophagitis, without bleeding: Secondary | ICD-10-CM

## 2019-09-08 DIAGNOSIS — Z95820 Peripheral vascular angioplasty status with implants and grafts: Secondary | ICD-10-CM

## 2019-09-08 DIAGNOSIS — I701 Atherosclerosis of renal artery: Secondary | ICD-10-CM

## 2019-09-08 DIAGNOSIS — I714 Abdominal aortic aneurysm, without rupture, unspecified: Secondary | ICD-10-CM

## 2019-09-08 DIAGNOSIS — I10 Essential (primary) hypertension: Secondary | ICD-10-CM

## 2019-09-08 DIAGNOSIS — I70212 Atherosclerosis of native arteries of extremities with intermittent claudication, left leg: Secondary | ICD-10-CM

## 2019-09-08 LAB — CBC WITH DIFFERENTIAL/PLATELET
Basophils Absolute: 0.1 10*3/uL (ref 0.0–0.2)
Basos: 1 %
EOS (ABSOLUTE): 0.4 10*3/uL (ref 0.0–0.4)
Eos: 7 %
Hematocrit: 34.8 % — ABNORMAL LOW (ref 37.5–51.0)
Hemoglobin: 11.1 g/dL — ABNORMAL LOW (ref 13.0–17.7)
Immature Grans (Abs): 0.1 10*3/uL (ref 0.0–0.1)
Immature Granulocytes: 1 %
Lymphocytes Absolute: 1.5 10*3/uL (ref 0.7–3.1)
Lymphs: 23 %
MCH: 26.7 pg (ref 26.6–33.0)
MCHC: 31.9 g/dL (ref 31.5–35.7)
MCV: 84 fL (ref 79–97)
Monocytes Absolute: 0.7 10*3/uL (ref 0.1–0.9)
Monocytes: 11 %
Neutrophils Absolute: 3.7 10*3/uL (ref 1.4–7.0)
Neutrophils: 57 %
Platelets: 412 10*3/uL (ref 150–450)
RBC: 4.16 x10E6/uL (ref 4.14–5.80)
RDW: 14.1 % (ref 11.6–15.4)
WBC: 6.5 10*3/uL (ref 3.4–10.8)

## 2019-09-08 LAB — COMPREHENSIVE METABOLIC PANEL
ALT: 19 IU/L (ref 0–44)
AST: 19 IU/L (ref 0–40)
Albumin/Globulin Ratio: 1.8 (ref 1.2–2.2)
Albumin: 4.2 g/dL (ref 3.6–4.6)
Alkaline Phosphatase: 62 IU/L (ref 39–117)
BUN/Creatinine Ratio: 14 (ref 10–24)
BUN: 18 mg/dL (ref 8–27)
Bilirubin Total: 0.3 mg/dL (ref 0.0–1.2)
CO2: 20 mmol/L (ref 20–29)
Calcium: 9.5 mg/dL (ref 8.6–10.2)
Chloride: 99 mmol/L (ref 96–106)
Creatinine, Ser: 1.31 mg/dL — ABNORMAL HIGH (ref 0.76–1.27)
GFR calc Af Amer: 57 mL/min/{1.73_m2} — ABNORMAL LOW (ref >59)
GFR calc non Af Amer: 49 mL/min/{1.73_m2} — ABNORMAL LOW (ref >59)
Globulin, Total: 2.3 g/dL (ref 1.5–4.5)
Glucose: 117 mg/dL — ABNORMAL HIGH (ref 65–99)
Potassium: 5.3 mmol/L — ABNORMAL HIGH (ref 3.5–5.2)
Sodium: 135 mmol/L (ref 134–144)
Total Protein: 6.5 g/dL (ref 6.0–8.5)

## 2019-09-08 LAB — HEMOGLOBIN A1C
Est. average glucose Bld gHb Est-mCnc: 160 mg/dL
Hgb A1c MFr Bld: 7.2 % — ABNORMAL HIGH (ref 4.8–5.6)

## 2019-09-08 NOTE — Discharge Summary (Signed)
[facility]    Discharge Summary  Patient ID:  Howard [name] MRN: [id number] DOB/AGE: 07-08-1931 85 y.o.  Admit date: 08/30/2019 Discharge date: 09/08/2019 Date of Surgery: 08/30/2019 - 09/01/2019 Surgeon: Juliann Mule): [name], MD  Admission Diagnosis: Splenic infarct [D73.5]  Discharge Diagnoses:  Splenic infarct [D73.5]  Secondary Diagnoses: Past Medical History:  Diagnosis Date  . Arthritis   . Bell palsy   . Cancer [facility])    prostate and skin  . Chronic combined systolic and diastolic CHF, NYHA class 1 ([place])    a. 07/2014 Echo: EF 35-40%, Gr 1 DD.  Marland Kitchen Complete heart block ([place])    a. 11/2010 s/p SJM 2210 Accent DC PPM, ser# [id number].  . Depression   . Diabetes mellitus without complication ([place])   . Fall 11-10-14  . GERD (gastroesophageal reflux disease)   . History of prostate cancer   . Hyperlipidemia   . Hypertension   . LBBB (left bundle branch block)   . Left-sided Bell's palsy   . Lung cancer ([place]) 2016  . NICM (nonischemic cardiomyopathy) ([place])    a. 07/2014 Echo: EF 35-40%, mid-apicalanteroseptal DK, Gr 1 DD, mild-mod dil LA.  . Non-obstructive CAD    a. 07/2014 Abnl MV;  b. 08/2014 Cath: LM nl, LAD 30p, RI 40p, LCX nl, OM1 40, RCA dominant 30p, 70d-->Med Rx.  Marland Kitchen Poor balance   . Presence of permanent cardiac pacemaker   . Sleep apnea    a. cpap  . Vertigo   . WPW (Wolff-Parkinson-White syndrome)    a. S/P RFCA 1991.   Procedure(s): TRANSESOPHAGEAL ECHOCARDIOGRAM (TEE)  Discharged Condition / HPI:   The patient is an 85 year old male with multiple medical issues well-known to our practice who presented to the [facility] emergency department with abdominal pain approximately 3 days after his discharge.  Patient's chief complaint was progressively worsening abdominal pain.  Patient is status post a left femoral endarterectomy, SFA stent, renal artery stent, AAA repair.  At the time of discharge, the  patient was doing well without pain at baseline with stable vital signs.  Patient was found to have a new splenic infarct on CT.  A cardiology consult was placed as the vascular surgery team felt our recent interventions could not have led to a splenic infarct.  The patient was found to have a patent foramen ovale and with his past medical history of A. fib most likely threw a clot to the spleen which infarcted.  Patient was discharged on aspirin and Eliquis with cardiology follow-up.  Upon discharge, the patient's pain had resolved, he was advanced to a regular diet without issue, he was urinating independently, and he was ambulating at baseline.  The day of discharge he was afebrile with stable vital signs.  Physical exam:  A&Ox3, NAD CV: RRR Pulm: CTA Bilaterally Abdomen: Soft, nontender, nondistended, positive bowel sounds Femoral endarterectomy site: C removed, incision healing well Bilateral lower extremities: Warm distally to toes.  Palpable pedal pulses.  Labs as below  Complications: None  Consults:  Treatment Team:  [name], MD  Significant Diagnostic Studies: CBC Lab Results  Component Value Date   WBC 6.5 09/07/2019   HGB 11.1 (L) 09/07/2019   HCT 34.8 (L) 09/07/2019   MCV 84 09/07/2019   PLT 412 09/07/2019   BMET    Component Value Date/Time   NA 135 09/07/2019 1017   NA 137 11/10/2014 2041   K 5.3 (H) 09/07/2019 1017  K 3.9 11/10/2014 2041   CL 99 09/07/2019 1017   CL 104 11/10/2014 2041   CO2 20 09/07/2019 1017   CO2 24 11/10/2014 2041   GLUCOSE 117 (H) 09/07/2019 1017   GLUCOSE 121 (H) 09/02/2019 0517   GLUCOSE 154 (H) 11/10/2014 2041   BUN 18 09/07/2019 1017   BUN 13 11/10/2014 2041   CREATININE 1.31 (H) 09/07/2019 1017   CREATININE 0.81 09/30/2017 1003   CALCIUM 9.5 09/07/2019 1017   CALCIUM 8.5 11/10/2014 2041   GFRNONAA 49 (L) 09/07/2019 1017   GFRNONAA 82 09/30/2017 1003   GFRAA 57 (L) 09/07/2019 1017   GFRAA 95 09/30/2017 1003    COAG Lab Results  Component Value Date   INR 1.1 08/30/2019   INR 1.1 08/25/2019   INR 1.1 08/23/2019   Disposition:  Discharge to :Home  Allergies as of 09/02/2019      Reactions   Sulfa Antibiotics Rash      Medication List    STOP taking these medications   amoxicillin-clavulanate 875-125 MG tablet Commonly known as: Augmentin   clopidogrel 75 MG tablet Commonly known as: PLAVIX     TAKE these medications   apixaban 5 MG Tabs tablet Commonly known as: ELIQUIS Take 1 tablet (5 mg total) by mouth 2 (two) times daily.   aspirin EC 81 MG tablet Take 81 mg by mouth daily.   fentaNYL 12 MCG/HR Commonly known as: [place] 1 patch onto the skin every 3 (three) days.   fluticasone 50 MCG/ACT nasal spray Commonly known as: FLONASE Place 2 sprays into both nostrils daily.   isosorbide mononitrate 30 MG 24 hr tablet Commonly known as: IMDUR TAKE 1 TABLET BY MOUTH DAILY   metFORMIN 500 MG tablet Commonly known as: GLUCOPHAGE Take 1 tablet (500 mg total) by mouth 2 (two) times daily with a meal.   metoprolol succinate 25 MG 24 hr tablet Commonly known as: TOPROL-XL Take 1 tablet (25 mg) by mouth once daily at bedtime What changed:   how much to take  how to take this  when to take this  additional instructions   montelukast 10 MG tablet Commonly known as: SINGULAIR Take 10 mg by mouth daily as needed (sneezing/allergies.).   multivitamin with minerals Tabs tablet Take 1 tablet by mouth daily. One-A-Day Multivitamin   mupirocin ointment 2 % Commonly known as: BACTROBAN Apply to affected area 3 times daily   omeprazole 40 MG capsule Commonly known as: PRILOSEC Take 1 capsule (40 mg total) by mouth 2 (two) times daily as needed. What changed: when to take this   sacubitril-valsartan 24-26 MG Commonly known as: ENTRESTO Take 1 tablet by mouth 2 (two) times daily.   simvastatin 40 MG tablet Commonly known as: ZOCOR TAKE 1 TABLET AT  BEDTIME   spironolactone 50 MG tablet Commonly known as: ALDACTONE Take 1 tablet (50 mg) by mouth once daily in the morning What changed:   how much to take  how to take this  when to take this  additional instructions   Vitamin D3 25 MCG (1000 UT) Caps Take 1,000 Units by mouth daily.      Verbal and written Discharge instructions given to the patient. Wound care per Discharge AVS Follow-up Information    Dew, Erskine Squibb, MD Follow up in 2 day(s).   Specialties: Vascular Surgery, Radiology, Interventional Cardiology Why: See Dew. Will need EVAR and ABI. First post-op. Contact information: [place] [address] [phone]  SignedSela Hua, PA-C  09/08/2019, 9:18 AM

## 2019-09-09 ENCOUNTER — Telehealth (INDEPENDENT_AMBULATORY_CARE_PROVIDER_SITE_OTHER): Payer: Self-pay | Admitting: Nurse Practitioner

## 2019-09-09 ENCOUNTER — Encounter (INDEPENDENT_AMBULATORY_CARE_PROVIDER_SITE_OTHER): Payer: Self-pay

## 2019-09-09 NOTE — Telephone Encounter (Signed)
Webb Silversmith with [facility] is calling requesting D/C verbal order for patient home health aid with bath. Patient and his spouse say they are able to do this independently and do not require assistance.  Also, patient is not eating or drinking much. Webb Silversmith is requesting verbal order for glucerna. I advised her she may need to call the PCP for this but would ask.   Please advise. AS, CMA

## 2019-09-09 NOTE — Telephone Encounter (Signed)
Yes to both requests

## 2019-09-09 NOTE — Telephone Encounter (Signed)
Spoke with Webb Silversmith, she is aware and verbalized understanding. AS, CMA

## 2019-09-10 ENCOUNTER — Telehealth (INDEPENDENT_AMBULATORY_CARE_PROVIDER_SITE_OTHER): Payer: Self-pay | Admitting: Vascular Surgery

## 2019-09-10 NOTE — Telephone Encounter (Signed)
Algernon Huxley, MD  You 13 minutes ago (11:45 AM)   The best thing to do is to try to increase activity, use an incentive spirometer (which I am not sure if you can get from a pharmacy or not, but his son is an Scientist, physiological at the hospital and we should be able to get him one from there), and try to keep getting the phlegm up. If he has fever or gets short of breath we may need to get a chest Xray.   Message text     You  Algernon Huxley, MD 22 minutes ago (11:36 AM)   Please advise. AS, CMA   Message text     Darrall Dears, Larose Hires, MD 14 hours ago (9:08 PM)     Howard [name], 10/11/1931/   is coughing quite a bit and phlem  comes up.  He says it feels like it is coming from his lungs. This had been happening some while in the hospital but has increased in frequency since coming home.  What would you advise?      Thank you very much!          Valaria Good and spoke with Webb Silversmith- I have informed her of the above but she added that the patient thinks the phlem that he was coughing up yesterday may have been bloody. Per Webb Silversmith patient is not coughing as much today. Please advise if there is any change in care. AS, CMA

## 2019-09-10 NOTE — Telephone Encounter (Signed)
As long as he is improving, let see how he does through the weekend and we can reassess the first of the week if he is not better.   Above is per Dr. Lucky Cowboy and I have spoken with Howard [name] is aware and verbalized understanding. AS, CMA

## 2019-09-14 ENCOUNTER — Other Ambulatory Visit (INDEPENDENT_AMBULATORY_CARE_PROVIDER_SITE_OTHER): Payer: Self-pay

## 2019-09-14 ENCOUNTER — Telehealth (INDEPENDENT_AMBULATORY_CARE_PROVIDER_SITE_OTHER): Payer: Self-pay | Admitting: Vascular Surgery

## 2019-09-14 ENCOUNTER — Telehealth: Payer: Self-pay | Admitting: Family Medicine

## 2019-09-14 ENCOUNTER — Encounter (INDEPENDENT_AMBULATORY_CARE_PROVIDER_SITE_OTHER): Payer: Self-pay | Admitting: Nurse Practitioner

## 2019-09-14 ENCOUNTER — Other Ambulatory Visit: Payer: Self-pay | Admitting: Cardiovascular Disease

## 2019-09-14 DIAGNOSIS — E119 Type 2 diabetes mellitus without complications: Secondary | ICD-10-CM

## 2019-09-14 DIAGNOSIS — I70212 Atherosclerosis of native arteries of extremities with intermittent claudication, left leg: Secondary | ICD-10-CM

## 2019-09-14 MED ORDER — ISOSORBIDE MONONITRATE ER 30 MG PO TB24: 30.0000 mg | ORAL_TABLET | Freq: Every day | ORAL | 2 refills | Status: DC

## 2019-09-14 MED ORDER — METFORMIN HCL 500 MG PO TABS: 500.0000 mg | ORAL_TABLET | Freq: Two times a day (BID) | ORAL | 3 refills | Status: DC

## 2019-09-14 MED ORDER — APIXABAN 5 MG PO TABS: 5.0000 mg | ORAL_TABLET | Freq: Two times a day (BID) | ORAL | 60 refills | Status: DC

## 2019-09-14 MED ORDER — SACUBITRIL-VALSARTAN 24-26 MG PO TABS: 1.0000 | ORAL_TABLET | Freq: Two times a day (BID) | ORAL | 2 refills | Status: DC

## 2019-09-14 MED ORDER — SACUBITRIL-VALSARTAN 24-26 MG PO TABS: 1.0000 | ORAL_TABLET | Freq: Two times a day (BID) | ORAL | 1 refills | Status: DC

## 2019-09-14 NOTE — Progress Notes (Signed)
SUBJECTIVE:  Patient ID: Howard [name], male    DOB: 1931/10/27, 85 y.o.   MRN: [id number] Chief Complaint  Patient presents with  . Follow-up    HPI  Howard [name] is a 85 y.o. male presents today for follow-up after extensive vascular surgery on 08/25/2019.  PROCEDURE: 1. Cutdown for placement of endoprosthesis right femoral artery 2. Left common femoral artery and profunda femoris artery endarterectomy with core matrix patch angioplasty 3. Catheter placement into aorta from bilateral femoral approaches 4. Catheter placement into left popliteal artery from left common femoral approach with left lower extremity angiogram 5. Placement of a 6 mm diameter by 15 cm length via bond stent left superficial femoral artery postdilated with a 5 mm diameter Lutonix drug-coated angioplasty balloon 6. Catheter placement into right renal artery from left femoral approach 7. Right renal artery stent placement with 7 mm diameter by 16 mm length lifestream stent 8. Placement of a 23 mm diameter 14 cm length Gore Excluder Endoprosthesis main body right with a 14 mm diameter by 12 cm length left contralateral limb 9. Stent placement in the right external iliac artery with 10 mm diameter by 58 mm length lifestream stent for occlusive disease 10. Gore excluder limb extension with a 12 mm diameter by 14 cm length limb to the distal right external iliac artery for occlusive disease 11. Viabahn stent placement to the left external iliac artery with 13 mm diameter by 10 cm length stent for occlusive disease 12. Fogarty embolectomy left SFA  The patient's postoperative course was complicated due to the fact that after the procedure the patient went into atrial fibrillation and was subsequently found to have a patent foramen ovale.  Subsequently, the patient had a splenic infarct.  Currently the patient is feeling much better with no claudication-like symptoms, no abdominal pain, no rest pain, with well  approximated and healing groin incision sites.  The patient is still somewhat weak however he is continue to work with physical therapy.  He denies any fever, chills, nausea, vomiting or diarrhea.  Today the patient underwent bilateral ABIs.  His right lower extremity has an ABI of 1.10 with a left 1.44.  Patient has triphasic waveforms within the bilateral tibial arteries with strong toe waveforms on the left.  Little dampened on the right but not severe.  The endovascular aneurysm repair appears to be patent at this time.  There is no evidence of endoleak.  The bilateral iliac stents appear patent with no evidence of restenosis.  The largest aortic measurement today was 4.3 cm.  No evidence of iliac artery aneurysms.  Past Medical History:  Diagnosis Date  . Arthritis   . Bell palsy   . Cancer [facility])    prostate and skin  . Chronic combined systolic and diastolic CHF, NYHA class 1 ([place])    a. 07/2014 Echo: EF 35-40%, Gr 1 DD.  Marland Kitchen Complete heart block ([place])    a. 11/2010 s/p SJM 2210 Accent DC PPM, ser# [id number].  . Depression   . Diabetes mellitus without complication ([place])   . Fall 11-10-14  . GERD (gastroesophageal reflux disease)   . History of prostate cancer   . Hyperlipidemia   . Hypertension   . LBBB (left bundle branch block)   . Left-sided Bell's palsy   . Lung cancer ([place]) 2016  . NICM (nonischemic cardiomyopathy) ([place])    a. 07/2014 Echo: EF 35-40%, mid-apicalanteroseptal DK, Gr 1 DD, mild-mod dil LA.  . Non-obstructive CAD  a. 07/2014 Abnl MV;  b. 08/2014 Cath: LM nl, LAD 30p, RI 40p, LCX nl, OM1 40, RCA dominant 30p, 70d-->Med Rx.  Marland Kitchen Poor balance   . Presence of permanent cardiac pacemaker   . Sleep apnea    a. cpap  . Vertigo   . WPW (Wolff-Parkinson-White syndrome)    a. S/P RFCA 1991.    Past Surgical History:  Procedure Laterality Date  . ABDOMINAL AORTIC ENDOVASCULAR STENT GRAFT  08/25/2019   Procedure: ABDOMINAL AORTIC ENDOVASCULAR STENT GRAFT;  Surgeon: Algernon Huxley, MD;  Location: [facility] ORS;  Service: Vascular;;  . ANGIOPLASTY Left 08/25/2019   Procedure: ANGIOPLASTY;  Surgeon: Algernon Huxley, MD;  Location: [facility] ORS;  Service: Vascular;  Laterality: Left;  left SFA and stent placement  . APPLICATION OF WOUND VAC Left 06/07/2015   Procedure: APPLICATION OF WOUND VAC;  Surgeon: Robert Bellow, MD;  Location: [facility] ORS;  Service: General;  Laterality: Left;  left upper back  . BACK SURGERY     2011  . CARDIAC CATHETERIZATION  08/26/2014   Single vessel obstructive CAD  . CARPAL TUNNEL RELEASE  04-04-15   [facility]  . CATARACT EXTRACTION  07-31-11 and 09-18-11  . Catheter ablation  1991   for WPW  . cervical fusion    . CHOLECYSTECTOMY  09-07-14  . ENDARTERECTOMY FEMORAL Left 08/25/2019   Procedure: ENDARTERECTOMY FEMORAL;  Surgeon: Algernon Huxley, MD;  Location: [facility] ORS;  Service: Vascular;  Laterality: Left;  common and produndis   . ENDOVASCULAR REPAIR/STENT GRAFT Right 08/25/2019   Procedure: ENDOVASCULAR REPAIR/STENT GRAFT;  Surgeon: Algernon Huxley, MD;  Location: [facility] ORS;  Service: Vascular;  Laterality: Right;  renal artery  . HAND SURGERY     right 1993; left 2005  . HERNIA REPAIR  1955  . INSERT / REPLACE / REMOVE PACEMAKER    . INSERTION OF ILIAC STENT Bilateral 08/25/2019   Procedure: INSERTION OF ILIAC STENT;  Surgeon: Algernon Huxley, MD;  Location: [facility] ORS;  Service: Vascular;  Laterality: Bilateral;  . JOINT REPLACEMENT Left 2013   knee  . JOINT REPLACEMENT Right 2004   knee  . KNEE SURGERY     left knee 1991 and 1992; right knee 1995  . LEFT HEART CATHETERIZATION WITH CORONARY ANGIOGRAM N/A 08/26/2014   Procedure: LEFT HEART CATHETERIZATION WITH CORONARY ANGIOGRAM;  Surgeon: [name], MD;  Location: [facility] CATH LAB;  Service: Cardiovascular;  Laterality: N/A;  . LOWER EXTREMITY ANGIOGRAPHY Left 08/23/2019   Procedure: Lower Extremity Angiography;  Surgeon: Algernon Huxley, MD;  Location: [place] CV LAB;  Service: Cardiovascular;  Laterality:  Left;  . LUMBAR LAMINECTOMY/DECOMPRESSION MICRODISCECTOMY N/A 06/07/2014   Procedure: LUMBAR FOUR TO FIVE LUMBAR LAMINECTOMY/DECOMPRESSION MICRODISCECTOMY 1 LEVEL;  Surgeon: [name], MD;  Location: [facility] NEURO ORS;  Service: Neurosurgery;  Laterality: N/A;  . LUNG BIOPSY Right 2016   Dr [name]  . PACEMAKER INSERTION     PPM-- St Jude 11/30/10 by [name]  . PPM GENERATOR CHANGEOUT N/A 07/09/2019   Procedure: PPM GENERATOR CHANGEOUT;  Surgeon: Deboraha Sprang, MD;  Location: [place] CV LAB;  Service: Cardiovascular;  Laterality: N/A;  . PROSTATE SURGERY     cancer--1998, prostatectomy  . REPLACEMENT TOTAL KNEE     2004  . ruptured disc     1962 and 1998  . TEE WITHOUT CARDIOVERSION N/A 09/01/2019   Procedure: TRANSESOPHAGEAL ECHOCARDIOGRAM (TEE);  Surgeon: [name], MD;  Location: [facility] ORS;  Service:  Cardiovascular;  Laterality: N/A;  . TEMPORARY PACEMAKER N/A 07/09/2019   Procedure: TEMPORARY PACEMAKER;  Surgeon: Deboraha Sprang, MD;  Location: [place] CV LAB;  Service: Cardiovascular;  Laterality: N/A;  . TRIGGER FINGER RELEASE  01-24-15  . WOUND DEBRIDEMENT Left 06/07/2015   Procedure: DEBRIDEMENT WOUND;  Surgeon: Robert Bellow, MD;  Location: [facility] ORS;  Service: General;  Laterality: Left;  left upper back    Social History   Socioeconomic History  . Marital status: Married    Spouse name: Not on file  . Number of children: 2  . Years of education: College  . Highest education level: Some college, no degree  Occupational History  . Occupation: Retired  Scientific laboratory technician  . Financial resource strain: Not hard at all  . Food insecurity    Worry: Never true    Inability: Never true  . Transportation needs    Medical: No    Non-medical: No  Tobacco Use  . Smoking status: Former Smoker    Years: 4.00  . Smokeless tobacco: Never Used  . Tobacco comment: Quit 2011  Substance and Sexual Activity  . Alcohol use: No    Frequency: Never  . Drug use: No  . Sexual activity:  Not on file  Lifestyle  . Physical activity    Days per week: 0 days    Minutes per session: 0 min  . Stress: Not at all  Relationships  . Social Herbalist on phone: Patient refused    Gets together: Patient refused    Attends religious service: Patient refused    Active member of club or organization: Patient refused    Attends meetings of clubs or organizations: Patient refused    Relationship status: Patient refused  . Intimate partner violence    Fear of current or ex partner: Patient refused    Emotionally abused: Patient refused    Physically abused: Patient refused    Forced sexual activity: Patient refused  Other Topics Concern  . Not on file  Social History Narrative   Drinks 2 cups of coffee a day     Family History  Problem Relation Age of Onset  . Heart attack Mother   . Hyperlipidemia Mother   . CAD Other   . Prostate cancer Neg Hx     Allergies  Allergen Reactions  . Sulfa Antibiotics Rash     Review of Systems   Review of Systems: Negative Unless Checked Constitutional: [] Weight loss  [] Fever  [] Chills Cardiac: [] Chest pain   [x]  Atrial Fibrillation  [] Palpitations   [] Shortness of breath when laying flat   [] Shortness of breath with exertion. [] Shortness of breath at rest Vascular:  [] Pain in legs with walking   [] Pain in legs with standing [] Pain in legs when laying flat   [] Claudication    [] Pain in feet when laying flat    [] History of DVT   [] Phlebitis   [] Swelling in legs   [] Varicose veins   [] Non-healing ulcers Pulmonary:   [] Uses home oxygen   [] Productive cough   [] Hemoptysis   [] Wheeze  [] COPD   [] Asthma Neurologic:  [] Dizziness   [] Seizures  [] Blackouts [] History of stroke   [] History of TIA  [] Aphasia   [] Temporary Blindness   [] Weakness or numbness in arm   [x] Weakness or numbness in leg Musculoskeletal:   [] Joint swelling   [] Joint pain   [] Low back pain  []  History of Knee Replacement [x] Arthritis [] back Surgeries  []  Spinal  Stenosis    Hematologic:  [] Easy bruising  [] Easy bleeding   [] Hypercoagulable state   [x] Anemic Gastrointestinal:  [] Diarrhea   [] Vomiting  [] Gastroesophageal reflux/heartburn   [] Difficulty swallowing. [] Abdominal pain Genitourinary:  [x] Chronic kidney disease   [] Difficult urination  [] Anuric   [] Blood in urine [] Frequent urination  [] Burning with urination   [] Hematuria Skin:  [] Rashes   [] Ulcers [x] Wounds Psychological:  [] History of anxiety   []  History of major depression  []  Memory Difficulties      OBJECTIVE:   Physical Exam  BP 131/69 (BP Location: Left Arm, Patient Position: Sitting, Cuff Size: Large)   Pulse 71   Resp 14   Ht 5' 9.5" (1.765 m)   Wt 180 lb (81.6 kg)   BMI 26.20 kg/m   Gen: WD/WN, NAD Head: [place]/AT, No temporalis wasting.  Ear/Nose/Throat: Hearing grossly intact, nares w/o erythema or drainage Eyes: PER, EOMI, sclera nonicteric.  Neck: Supple, no masses.  No JVD.  Pulmonary:  Good air movement, no use of accessory muscles.  Cardiac: RRR Vascular:  Bilateral groin sites well approximated Vessel Right Left  Radial Palpable Palpable  Dorsalis Pedis Palpable Palpable  Posterior Tibial Palpable Palpable   Gastrointestinal: soft, non-distended. No guarding/no peritoneal signs.  Musculoskeletal: Uses walker for ambulation. No deformity or atrophy.  Neurologic: Pain and light touch intact in extremities.  Symmetrical.  Speech is fluent. Motor exam as listed above. Psychiatric: Judgment intact, Mood & affect appropriate for pt's clinical situation. Dermatologic: No Venous rashes. No Ulcers Noted.  No changes consistent with cellulitis. Lymph : No Cervical lymphadenopathy, no lichenification or skin changes of chronic lymphedema.       ASSESSMENT AND PLAN:  1. Atherosclerosis of native artery of left lower extremity with intermittent claudication (HCC)  Recommend:  The patient has evidence of atherosclerosis of the lower extremities with claudication.   The patient does not voice lifestyle limiting changes at this point in time.  Noninvasive studies do not suggest clinically significant change.  No invasive studies, angiography or surgery at this time The patient should continue walking and begin a more formal exercise program.  The patient should continue antiplatelet therapy and aggressive treatment of the lipid abnormalities  No changes in the patient's medications at this time  The patient should continue wearing graduated compression socks 10-15 mmHg strength to control the mild edema.   - VAS [place] ABI WITH/WO TBI; Future  2. Renal artery stenosis [facility]) Patient had right renal artery stent placed during the procedure.  We will have a renal duplex when the patient returns in order to assess stent patency.  Otherwise, blood pressure appears to be under good control. - VAS US RENAL ARTERY DUPLEX; Future  3. Gastroesophageal reflux disease with esophagitis Continue PPI as already ordered, this medication has been reviewed and there are no changes at this time.  Avoidence of caffeine and alcohol  Moderate elevation of the head of the bed   4. Essential hypertension Continue antihypertensive medications as already ordered, these medications have been reviewed and there are no changes at this time.    Current Outpatient Medications on File Prior to Visit  Medication Sig Dispense Refill  . aspirin EC 81 MG tablet Take 81 mg by mouth daily.    . Cholecalciferol (VITAMIN D3) 1000 UNITS CAPS Take 1,000 Units by mouth daily.     . fentaNYL (DURAGESIC) 12 MCG/HR Place 1 patch onto the skin every 3 (three) days. 10 patch 0  . fluticasone (FLONASE) 50 MCG/ACT nasal  spray Place 2 sprays into both nostrils daily.    . metoprolol succinate (TOPROL-XL) 25 MG 24 hr tablet Take 1 tablet (25 mg) by mouth once daily at bedtime (Patient taking differently: Take 25 mg by mouth at bedtime. ) 90 tablet 3  . montelukast (SINGULAIR) 10 MG tablet Take 10  mg by mouth daily as needed (sneezing/allergies.).     Marland Kitchen Multiple Vitamin (MULTIVITAMIN WITH MINERALS) TABS tablet Take 1 tablet by mouth daily. One-A-Day Multivitamin    . omeprazole (PRILOSEC) 40 MG capsule Take 1 capsule (40 mg total) by mouth 2 (two) times daily as needed. (Patient taking differently: Take 40 mg by mouth 2 (two) times a day. ) 180 capsule 3  . simvastatin (ZOCOR) 40 MG tablet TAKE 1 TABLET AT BEDTIME (Patient taking differently: Take 40 mg by mouth at bedtime. ) 90 tablet 0  . spironolactone (ALDACTONE) 50 MG tablet Take 1 tablet (50 mg) by mouth once daily in the morning (Patient taking differently: Take 50 mg by mouth every morning. ) 90 tablet 3  . mupirocin ointment (BACTROBAN) 2 % Apply to affected area 3 times daily (Patient not taking: Reported on 08/30/2019) 22 g 0   No current facility-administered medications on file prior to visit.     There are no Patient Instructions on file for this visit. No follow-ups on file.   Kris Hartmann, NP  This note was completed with Sales executive.  Any errors are purely unintentional.

## 2019-09-14 NOTE — Telephone Encounter (Signed)
°*  STAT* If patient is at the pharmacy, call can be transferred to refill team.   1. Which medications need to be refilled? (please list name of each medication and dose if known) isosorbide 30 mg daily,Entresto 24-26 mg bid,   2. Which pharmacy/location (including street and city if local pharmacy) is medication to be sent to? [facility]  3. Do they need a 30 day or 90 day supply? 90    *STAT* If patient is at the pharmacy, call can be transferred to refill team.   1. Which medications need to be refilled? (please list name of each medication and dose if known) Entresto 24-26 mg bid  2. Which pharmacy/location (including street and city if local pharmacy) is medication to be sent to?[facility]  3. Do they need a 30 day or 90 day supply? Small supply while mail order is delivered  **patient only has 2 pills left   Please advise patient wife is able to complete at [phone]

## 2019-09-14 NOTE — Telephone Encounter (Signed)
Medication was sent via e-prescribe to [facility] - ok per Dew. Patient spouse has been made aware. AS, CMA

## 2019-09-14 NOTE — Telephone Encounter (Signed)
Spoke with pts wife and Howard [name] is needs a short supply of entresto sent to [facility], and also needs Rx of entresto sent to [facility], as well as the Isosorbide to [facility]. Will send these Rxs to the appropriate Pharmacy.

## 2019-09-14 NOTE — Telephone Encounter (Signed)
Pt needs a refill Metformin 500 mg  [facility]  [place]

## 2019-09-21 ENCOUNTER — Encounter: Payer: Self-pay | Admitting: Family Medicine

## 2019-10-04 DIAGNOSIS — Z7984 Long term (current) use of oral hypoglycemic drugs: Secondary | ICD-10-CM | POA: Diagnosis not present

## 2019-10-04 DIAGNOSIS — I428 Other cardiomyopathies: Secondary | ICD-10-CM | POA: Diagnosis not present

## 2019-10-04 DIAGNOSIS — Z7902 Long term (current) use of antithrombotics/antiplatelets: Secondary | ICD-10-CM | POA: Diagnosis not present

## 2019-10-04 DIAGNOSIS — C3491 Malignant neoplasm of unspecified part of right bronchus or lung: Secondary | ICD-10-CM | POA: Diagnosis not present

## 2019-10-04 DIAGNOSIS — I70293 Other atherosclerosis of native arteries of extremities, bilateral legs: Secondary | ICD-10-CM | POA: Diagnosis not present

## 2019-10-04 DIAGNOSIS — E785 Hyperlipidemia, unspecified: Secondary | ICD-10-CM | POA: Diagnosis not present

## 2019-10-04 DIAGNOSIS — F329 Major depressive disorder, single episode, unspecified: Secondary | ICD-10-CM | POA: Diagnosis not present

## 2019-10-04 DIAGNOSIS — M1990 Unspecified osteoarthritis, unspecified site: Secondary | ICD-10-CM | POA: Diagnosis not present

## 2019-10-04 DIAGNOSIS — I5042 Chronic combined systolic (congestive) and diastolic (congestive) heart failure: Secondary | ICD-10-CM | POA: Diagnosis not present

## 2019-10-04 DIAGNOSIS — Z950 Presence of cardiac pacemaker: Secondary | ICD-10-CM | POA: Diagnosis not present

## 2019-10-04 DIAGNOSIS — Z9862 Peripheral vascular angioplasty status: Secondary | ICD-10-CM | POA: Diagnosis not present

## 2019-10-04 DIAGNOSIS — Z48812 Encounter for surgical aftercare following surgery on the circulatory system: Secondary | ICD-10-CM | POA: Diagnosis not present

## 2019-10-04 DIAGNOSIS — E1151 Type 2 diabetes mellitus with diabetic peripheral angiopathy without gangrene: Secondary | ICD-10-CM | POA: Diagnosis not present

## 2019-10-04 DIAGNOSIS — I456 Pre-excitation syndrome: Secondary | ICD-10-CM | POA: Diagnosis not present

## 2019-10-04 DIAGNOSIS — I110 Hypertensive heart disease with heart failure: Secondary | ICD-10-CM | POA: Diagnosis not present

## 2019-10-04 DIAGNOSIS — I2510 Atherosclerotic heart disease of native coronary artery without angina pectoris: Secondary | ICD-10-CM | POA: Diagnosis not present

## 2019-10-06 DIAGNOSIS — I5042 Chronic combined systolic (congestive) and diastolic (congestive) heart failure: Secondary | ICD-10-CM | POA: Diagnosis not present

## 2019-10-06 DIAGNOSIS — E1151 Type 2 diabetes mellitus with diabetic peripheral angiopathy without gangrene: Secondary | ICD-10-CM | POA: Diagnosis not present

## 2019-10-06 DIAGNOSIS — I70293 Other atherosclerosis of native arteries of extremities, bilateral legs: Secondary | ICD-10-CM | POA: Diagnosis not present

## 2019-10-06 DIAGNOSIS — I2510 Atherosclerotic heart disease of native coronary artery without angina pectoris: Secondary | ICD-10-CM | POA: Diagnosis not present

## 2019-10-06 DIAGNOSIS — Z48812 Encounter for surgical aftercare following surgery on the circulatory system: Secondary | ICD-10-CM | POA: Diagnosis not present

## 2019-10-06 DIAGNOSIS — I110 Hypertensive heart disease with heart failure: Secondary | ICD-10-CM | POA: Diagnosis not present

## 2019-10-11 ENCOUNTER — Ambulatory Visit (INDEPENDENT_AMBULATORY_CARE_PROVIDER_SITE_OTHER): Payer: Medicare Other | Admitting: *Deleted

## 2019-10-11 DIAGNOSIS — I70293 Other atherosclerosis of native arteries of extremities, bilateral legs: Secondary | ICD-10-CM | POA: Diagnosis not present

## 2019-10-11 DIAGNOSIS — I5042 Chronic combined systolic (congestive) and diastolic (congestive) heart failure: Secondary | ICD-10-CM | POA: Diagnosis not present

## 2019-10-11 DIAGNOSIS — I110 Hypertensive heart disease with heart failure: Secondary | ICD-10-CM | POA: Diagnosis not present

## 2019-10-11 DIAGNOSIS — E1151 Type 2 diabetes mellitus with diabetic peripheral angiopathy without gangrene: Secondary | ICD-10-CM | POA: Diagnosis not present

## 2019-10-11 DIAGNOSIS — Z48812 Encounter for surgical aftercare following surgery on the circulatory system: Secondary | ICD-10-CM | POA: Diagnosis not present

## 2019-10-11 DIAGNOSIS — I2510 Atherosclerotic heart disease of native coronary artery without angina pectoris: Secondary | ICD-10-CM | POA: Diagnosis not present

## 2019-10-11 DIAGNOSIS — I420 Dilated cardiomyopathy: Secondary | ICD-10-CM

## 2019-10-11 DIAGNOSIS — I442 Atrioventricular block, complete: Secondary | ICD-10-CM

## 2019-10-12 LAB — CUP PACEART REMOTE DEVICE CHECK
Battery Remaining Longevity: 118 mo
Battery Remaining Percentage: 95.5 %
Battery Voltage: 3.02 V
Brady Statistic AP VP Percent: 1 %
Brady Statistic AP VS Percent: 1 %
Brady Statistic AS VP Percent: 99 %
Brady Statistic AS VS Percent: 1 %
Brady Statistic RA Percent Paced: 1 %
Brady Statistic RV Percent Paced: 99 %
Date Time Interrogation Session: 20201019060015
Implantable Lead Implant Date: 20111208
Implantable Lead Implant Date: 20111208
Implantable Lead Location: 753859
Implantable Lead Location: 753860
Implantable Lead Model: 1948
Implantable Pulse Generator Implant Date: 20200717
Lead Channel Impedance Value: 350 Ohm
Lead Channel Impedance Value: 490 Ohm
Lead Channel Pacing Threshold Amplitude: 0.75 V
Lead Channel Pacing Threshold Amplitude: 0.875 V
Lead Channel Pacing Threshold Pulse Width: 0.5 ms
Lead Channel Pacing Threshold Pulse Width: 0.5 ms
Lead Channel Sensing Intrinsic Amplitude: 12 mV
Lead Channel Sensing Intrinsic Amplitude: 2.5 mV
Lead Channel Setting Pacing Amplitude: 1.125
Lead Channel Setting Pacing Amplitude: 2 V
Lead Channel Setting Pacing Pulse Width: 0.5 ms
Lead Channel Setting Sensing Sensitivity: 4 mV
Pulse Gen Model: 2272
Pulse Gen Serial Number: 9150507

## 2019-10-13 DIAGNOSIS — E1151 Type 2 diabetes mellitus with diabetic peripheral angiopathy without gangrene: Secondary | ICD-10-CM | POA: Diagnosis not present

## 2019-10-13 DIAGNOSIS — I5042 Chronic combined systolic (congestive) and diastolic (congestive) heart failure: Secondary | ICD-10-CM | POA: Diagnosis not present

## 2019-10-13 DIAGNOSIS — I110 Hypertensive heart disease with heart failure: Secondary | ICD-10-CM | POA: Diagnosis not present

## 2019-10-13 DIAGNOSIS — I70293 Other atherosclerosis of native arteries of extremities, bilateral legs: Secondary | ICD-10-CM | POA: Diagnosis not present

## 2019-10-13 DIAGNOSIS — Z48812 Encounter for surgical aftercare following surgery on the circulatory system: Secondary | ICD-10-CM | POA: Diagnosis not present

## 2019-10-13 DIAGNOSIS — I2510 Atherosclerotic heart disease of native coronary artery without angina pectoris: Secondary | ICD-10-CM | POA: Diagnosis not present

## 2019-10-18 DIAGNOSIS — C44329 Squamous cell carcinoma of skin of other parts of face: Secondary | ICD-10-CM | POA: Diagnosis not present

## 2019-10-20 DIAGNOSIS — I70293 Other atherosclerosis of native arteries of extremities, bilateral legs: Secondary | ICD-10-CM | POA: Diagnosis not present

## 2019-10-20 DIAGNOSIS — E1151 Type 2 diabetes mellitus with diabetic peripheral angiopathy without gangrene: Secondary | ICD-10-CM | POA: Diagnosis not present

## 2019-10-20 DIAGNOSIS — I5042 Chronic combined systolic (congestive) and diastolic (congestive) heart failure: Secondary | ICD-10-CM | POA: Diagnosis not present

## 2019-10-20 DIAGNOSIS — I110 Hypertensive heart disease with heart failure: Secondary | ICD-10-CM | POA: Diagnosis not present

## 2019-10-20 DIAGNOSIS — Z48812 Encounter for surgical aftercare following surgery on the circulatory system: Secondary | ICD-10-CM | POA: Diagnosis not present

## 2019-10-20 DIAGNOSIS — I2510 Atherosclerotic heart disease of native coronary artery without angina pectoris: Secondary | ICD-10-CM | POA: Diagnosis not present

## 2019-10-21 DIAGNOSIS — E1151 Type 2 diabetes mellitus with diabetic peripheral angiopathy without gangrene: Secondary | ICD-10-CM | POA: Diagnosis not present

## 2019-10-21 DIAGNOSIS — I110 Hypertensive heart disease with heart failure: Secondary | ICD-10-CM | POA: Diagnosis not present

## 2019-10-21 DIAGNOSIS — I70293 Other atherosclerosis of native arteries of extremities, bilateral legs: Secondary | ICD-10-CM | POA: Diagnosis not present

## 2019-10-21 DIAGNOSIS — I2510 Atherosclerotic heart disease of native coronary artery without angina pectoris: Secondary | ICD-10-CM | POA: Diagnosis not present

## 2019-10-21 DIAGNOSIS — I5042 Chronic combined systolic (congestive) and diastolic (congestive) heart failure: Secondary | ICD-10-CM | POA: Diagnosis not present

## 2019-10-21 DIAGNOSIS — Z48812 Encounter for surgical aftercare following surgery on the circulatory system: Secondary | ICD-10-CM | POA: Diagnosis not present

## 2019-10-25 DIAGNOSIS — C44222 Squamous cell carcinoma of skin of right ear and external auricular canal: Secondary | ICD-10-CM | POA: Diagnosis not present

## 2019-10-25 DIAGNOSIS — C44329 Squamous cell carcinoma of skin of other parts of face: Secondary | ICD-10-CM | POA: Diagnosis not present

## 2019-11-02 ENCOUNTER — Other Ambulatory Visit: Payer: Self-pay | Admitting: Cardiovascular Disease

## 2019-11-02 NOTE — Progress Notes (Signed)
Remote pacemaker transmission.   

## 2019-11-02 NOTE — Addendum Note (Signed)
Addended by: Douglass Rivers D on: 11/02/2019 11:48 AM   Modules accepted: Level of Service

## 2019-11-04 NOTE — Progress Notes (Addendum)
Subjective:   Howard [name] is a 85 y.o. male who presents for Medicare Annual/Subsequent preventive examination.    This visit is being conducted through telemedicine due to the COVID-19 pandemic. This patient has given me verbal consent via doximity to conduct this visit, patient states they are participating from their home address. Some vital signs may be absent or patient reported.    Patient identification: identified by name, DOB, and current address  Review of Systems:  N/A  Cardiac Risk Factors include: advanced age (>78men, >20 women);diabetes mellitus;dyslipidemia;hypertension;male gender     Objective:    Vitals: There were no vitals taken for this visit.  There is no height or weight on file to calculate BMI. Unable to obtain vitals due to visit being conducted via telephonically.   Advanced Directives 11/08/2019 09/01/2019 08/30/2019 08/25/2019 08/23/2019 08/22/2019 08/22/2019  Does Patient Have a Medical Advance Directive? Yes Yes Yes Yes Yes No No  Type of Paramedic of [place];Living will Healthcare Power of [place];Living will [place];Living will [place];Living will - -  Does patient want to make changes to medical advance directive? - No - Patient declined No - Patient declined No - Patient declined - - -  Copy of [place] in Chart? Yes - validated most recent copy scanned in chart (See row information) No - copy requested - No - copy requested No - copy requested - -  Would patient like information on creating a medical advance directive? - No - Patient declined No - Patient declined No - Patient declined - No - Patient declined No - Patient declined    Tobacco Social History   Tobacco Use  Smoking Status Former Smoker  . Years: 4.00  Smokeless Tobacco Never Used  Tobacco Comment   Quit 2011     Counseling given: Not Answered Comment: Quit 2011   Clinical Intake:  Pre-visit preparation completed: Yes  Pain : No/denies pain Pain Score: 0-No pain     Nutritional Risks: None Diabetes: Yes  How often do you need to have someone help you when you read instructions, pamphlets, or other written materials from your doctor or pharmacy?: 1 - Never   Diabetes:  Is the patient diabetic?  Yes type 2 If diabetic, was a CBG obtained today?  No  Did the patient bring in their glucometer from home?  No  How often do you monitor your CBG's? Daily unless he forgets.   Financial Strains and Diabetes Management:  Are you having any financial strains with the device, your supplies or your medication? No .  Does the patient want to be seen by Chronic Care Management for management of their diabetes?  No  Would the patient like to be referred to a Nutritionist or for Diabetic Management?  No   Diabetic Exams:  Diabetic Eye Exam: Completed 05/21/18. Overdue for diabetic eye exam. Pt has been advised about the importance in completing this exam.   Diabetic Foot Exam: Completed 11/27/18. Repeat yearly.   Interpreter Needed?: No  Information entered by :: [facility], LPN  Past Medical History:  Diagnosis Date  . Arthritis   . Bell palsy   . Cancer [facility])    prostate and skin  . Chronic combined systolic and diastolic CHF, NYHA class 1 ([place])    a. 07/2014 Echo: EF 35-40%, Gr 1 DD.  Marland Kitchen Complete heart block ([place])    a. 11/2010 s/p SJM 2210  [facility] DC PPM, ser# [id number].  . Depression   . Diabetes mellitus without complication ([place])   . Fall 11-10-14  . GERD (gastroesophageal reflux disease)   . History of prostate cancer   . Hyperlipidemia   . Hypertension   . LBBB (left bundle branch block)   . Left-sided Bell's palsy   . Lung cancer ([place]) 2016  . NICM (nonischemic cardiomyopathy) ([place])    a. 07/2014 Echo: EF 35-40%, mid-apicalanteroseptal DK, Gr 1 DD, mild-mod dil LA.  . Non-obstructive CAD    a. 07/2014 Abnl MV;  b. 08/2014 Cath: LM  nl, LAD 30p, RI 40p, LCX nl, OM1 40, RCA dominant 30p, 70d-->Med Rx.  Marland Kitchen Poor balance   . Presence of permanent cardiac pacemaker   . Sleep apnea    a. cpap  . Vertigo   . WPW (Wolff-Parkinson-White syndrome)    a. S/P RFCA 1991.   Past Surgical History:  Procedure Laterality Date  . ABDOMINAL AORTIC ENDOVASCULAR STENT GRAFT  08/25/2019   Procedure: ABDOMINAL AORTIC ENDOVASCULAR STENT GRAFT;  Surgeon: Algernon Huxley, MD;  Location: [facility] ORS;  Service: Vascular;;  . ANGIOPLASTY Left 08/25/2019   Procedure: ANGIOPLASTY;  Surgeon: Algernon Huxley, MD;  Location: [facility] ORS;  Service: Vascular;  Laterality: Left;  left SFA and stent placement  . APPLICATION OF WOUND VAC Left 06/07/2015   Procedure: APPLICATION OF WOUND VAC;  Surgeon: Robert Bellow, MD;  Location: [facility] ORS;  Service: General;  Laterality: Left;  left upper back  . BACK SURGERY     2011  . CARDIAC CATHETERIZATION  08/26/2014   Single vessel obstructive CAD  . CARPAL TUNNEL RELEASE  04-04-15   [facility]  . CATARACT EXTRACTION  07-31-11 and 09-18-11  . Catheter ablation  1991   for WPW  . cervical fusion    . CHOLECYSTECTOMY  09-07-14  . ENDARTERECTOMY FEMORAL Left 08/25/2019   Procedure: ENDARTERECTOMY FEMORAL;  Surgeon: Algernon Huxley, MD;  Location: [facility] ORS;  Service: Vascular;  Laterality: Left;  common and produndis   . ENDOVASCULAR REPAIR/STENT GRAFT Right 08/25/2019   Procedure: ENDOVASCULAR REPAIR/STENT GRAFT;  Surgeon: Algernon Huxley, MD;  Location: [facility] ORS;  Service: Vascular;  Laterality: Right;  renal artery  . HAND SURGERY     right 1993; left 2005  . HERNIA REPAIR  1955  . INSERT / REPLACE / REMOVE PACEMAKER    . INSERTION OF ILIAC STENT Bilateral 08/25/2019   Procedure: INSERTION OF ILIAC STENT;  Surgeon: Algernon Huxley, MD;  Location: [facility] ORS;  Service: Vascular;  Laterality: Bilateral;  . JOINT REPLACEMENT Left 2013   knee  . JOINT REPLACEMENT Right 2004   knee  . KNEE SURGERY     left knee 1991 and 1992; right knee 1995  .  LEFT HEART CATHETERIZATION WITH CORONARY ANGIOGRAM N/A 08/26/2014   Procedure: LEFT HEART CATHETERIZATION WITH CORONARY ANGIOGRAM;  Surgeon: [name], MD;  Location: [facility] CATH LAB;  Service: Cardiovascular;  Laterality: N/A;  . LOWER EXTREMITY ANGIOGRAPHY Left 08/23/2019   Procedure: Lower Extremity Angiography;  Surgeon: Algernon Huxley, MD;  Location: [place] CV LAB;  Service: Cardiovascular;  Laterality: Left;  . LUMBAR LAMINECTOMY/DECOMPRESSION MICRODISCECTOMY N/A 06/07/2014   Procedure: LUMBAR FOUR TO FIVE LUMBAR LAMINECTOMY/DECOMPRESSION MICRODISCECTOMY 1 LEVEL;  Surgeon: [name], MD;  Location: [place] NEURO ORS;  Service: Neurosurgery;  Laterality: N/A;  . LUNG BIOPSY Right 2016   Dr [name]  . PACEMAKER INSERTION     PPM-- [facility] 11/30/10 by [name]  . PPM GENERATOR CHANGEOUT N/A 07/09/2019   Procedure: PPM GENERATOR CHANGEOUT;  Surgeon: Deboraha Sprang, MD;  Location: [place] CV LAB;  Service: Cardiovascular;  Laterality: N/A;  . PROSTATE SURGERY     cancer--1998, prostatectomy  . REPLACEMENT TOTAL KNEE     2004  . ruptured disc     1962 and 1998  . TEE WITHOUT CARDIOVERSION N/A 09/01/2019   Procedure: TRANSESOPHAGEAL ECHOCARDIOGRAM (TEE);  Surgeon: [name], MD;  Location: [facility] ORS;  Service: Cardiovascular;  Laterality: N/A;  . TEMPORARY PACEMAKER N/A 07/09/2019   Procedure: TEMPORARY PACEMAKER;  Surgeon: Deboraha Sprang, MD;  Location: [place] CV LAB;  Service: Cardiovascular;  Laterality: N/A;  . TRIGGER FINGER RELEASE  01-24-15  . WOUND DEBRIDEMENT Left 06/07/2015   Procedure: DEBRIDEMENT WOUND;  Surgeon: Robert Bellow, MD;  Location: [facility] ORS;  Service: General;  Laterality: Left;  left upper back   Family History  Problem Relation Age of Onset  . Heart attack Mother   . Hyperlipidemia Mother   . CAD Other   . Prostate cancer Neg Hx    Social History   Socioeconomic History  . Marital status: Married    Spouse name: Not on file  . Number of children: 2   . Years of education: College  . Highest education level: Some college, no degree  Occupational History  . Occupation: Retired  Scientific laboratory technician  . Financial resource strain: Not hard at all  . Food insecurity    Worry: Never true    Inability: Never true  . Transportation needs    Medical: No    Non-medical: No  Tobacco Use  . Smoking status: Former Smoker    Years: 4.00  . Smokeless tobacco: Never Used  . Tobacco comment: Quit 2011  Substance and Sexual Activity  . Alcohol use: No    Frequency: Never  . Drug use: No  . Sexual activity: Not on file  Lifestyle  . Physical activity    Days per week: 0 days    Minutes per session: 0 min  . Stress: Not at all  Relationships  . Social Herbalist on phone: Patient refused    Gets together: Patient refused    Attends religious service: Patient refused    Active member of club or organization: Patient refused    Attends meetings of clubs or organizations: Patient refused    Relationship status: Patient refused  Other Topics Concern  . Not on file  Social History Narrative   Drinks 2 cups of coffee a day     Outpatient Encounter Medications as of 11/08/2019  Medication Sig  . apixaban (ELIQUIS) 5 MG TABS tablet Take 1 tablet (5 mg total) by mouth 2 (two) times daily.  Marland Kitchen aspirin EC 81 MG tablet Take 81 mg by mouth daily.  . Cholecalciferol (VITAMIN D3) 1000 UNITS CAPS Take 1,000 Units by mouth daily.   . fluticasone (FLONASE) 50 MCG/ACT nasal spray Place 2 sprays into both nostrils daily.  . isosorbide mononitrate (IMDUR) 30 MG 24 hr tablet Take 1 tablet (30 mg total) by mouth daily.  . metFORMIN (GLUCOPHAGE) 500 MG tablet Take 1 tablet (500 mg total) by mouth 2 (two) times daily with a meal.  . metoprolol succinate (TOPROL-XL) 25 MG 24 hr tablet Take 1 tablet (25 mg) by mouth once daily at bedtime (Patient taking differently: Take 25 mg by mouth at bedtime. )  . montelukast (  SINGULAIR) 10 MG tablet Take 10 mg by mouth  daily as needed (sneezing/allergies.).   Marland Kitchen Multiple Vitamin (MULTIVITAMIN WITH MINERALS) TABS tablet Take 1 tablet by mouth daily. One-A-Day Multivitamin  . omeprazole (PRILOSEC) 40 MG capsule Take 1 capsule (40 mg total) by mouth 2 (two) times daily as needed. (Patient taking differently: Take 40 mg by mouth 2 (two) times a day. )  . sacubitril-valsartan (ENTRESTO) 24-26 MG Take 1 tablet by mouth 2 (two) times daily.  . simvastatin (ZOCOR) 40 MG tablet TAKE 1 TABLET AT BEDTIME  . spironolactone (ALDACTONE) 50 MG tablet Take 1 tablet (50 mg) by mouth once daily in the morning (Patient taking differently: Take 50 mg by mouth every morning. )  . fentaNYL (DURAGESIC) 12 MCG/HR Place 1 patch onto the skin every 3 (three) days. (Patient not taking: Reported on 11/08/2019)  . mupirocin ointment (BACTROBAN) 2 % Apply to affected area 3 times daily (Patient not taking: Reported on 08/30/2019)   No facility-administered encounter medications on file as of 11/08/2019.     Activities of Daily Living In your present state of health, do you have any difficulty performing the following activities: 11/08/2019 08/30/2019  Hearing? Y -  Comment Wears bilateral hearing aids. -  Vision? N -  Difficulty concentrating or making decisions? N -  Walking or climbing stairs? N -  Dressing or bathing? N -  Doing errands, shopping? N N  Preparing Food and eating ? N -  Using the Toilet? N -  In the past six months, have you accidently leaked urine? Y -  Comment Wears protective pads at all times of day. -  Do you have problems with loss of bowel control? N -  Managing your Medications? N -  Managing your Finances? N -  Housekeeping or managing your Housekeeping? N -  Some recent data might be hidden    Patient Care Team: Chrismon, [name], PA as PCP - General (Family Medicine) [name], Kathlene November, MD as Consulting Physician (Cardiology) Dasher, [name], MD as Consulting Physician (Dermatology) Birder Robson,  MD as Referring Physician (Ophthalmology) [name], MD as Consulting Physician (Vascular Surgery) Deboraha Sprang, MD as Consulting Physician (Cardiology) Lucky Cowboy Erskine Squibb, MD as Referring Physician (Vascular Surgery)   Assessment:   This is a routine wellness examination for Li.  Exercise Activities and Dietary recommendations Current Exercise Habits: The patient does not participate in regular exercise at present, Exercise limited by: orthopedic condition(s)  Goals    . Exercise 3x per week (30 min per time)     Recommend to exercise for 3 days a week for at least 30 minutes at a time.     . Increase water intake     Starting 10/24/16, I will increase my water intake to 3-4 glasses a day.       Fall Risk Fall Risk  11/08/2019 02/24/2019 01/25/2019 11/04/2018 11/03/2018  Falls in the past year? 0 0 1 0 0  Number falls in past yr: 0 - 0 - 0  Injury with Fall? 0 - 1 - 0  Comment - - abrasions and bruises  - -  Risk Factor Category  - - - - -  Risk for fall due to : - - Impaired balance/gait - -  Risk for fall due to: Comment - - - - -  Follow up - - - - -   FALL RISK PREVENTION PERTAINING TO THE HOME:  Any stairs in or around the home?  Yes  If so, are there any without handrails? No   Home free of loose throw rugs in walkways, pet beds, electrical cords, etc? Yes  Adequate lighting in your home to reduce risk of falls? Yes   ASSISTIVE DEVICES UTILIZED TO PREVENT FALLS:  Life alert? Yes  Use of a cane, walker or w/c? Yes  Grab bars in the bathroom? Yes  Shower chair or bench in shower? Yes  Elevated toilet seat or a handicapped toilet? Yes    TIMED UP AND GO:  Was the test performed? No .    Depression Screen PHQ 2/9 Scores 11/08/2019 02/24/2019 11/04/2018 11/03/2018  PHQ - 2 Score 0 0 0 0    Cognitive Function     6CIT Screen 11/08/2019 11/04/2018 10/31/2017 10/24/2016  What Year? 0 points 0 points 0 points 0 points  What month? 0 points 0 points 0  points 0 points  What time? 0 points 0 points 0 points 0 points  Count back from 20 0 points 0 points 0 points 0 points  Months in reverse 0 points 0 points 2 points 0 points  Repeat phrase 0 points 0 points 2 points 6 points  Total Score 0 0 4 6    Immunization History  Administered Date(s) Administered  . Influenza, High Dose Seasonal PF 09/15/2015, 09/30/2017, 10/02/2018  . Influenza, Quadrivalent, Recombinant, Inj, Pf 10/12/2019  . Influenza-Unspecified 08/23/2013, 09/15/2015, 09/12/2016  . Pneumococcal Conjugate-13 04/18/2016  . Pneumococcal Polysaccharide-23 09/30/2017  . Tdap 09/07/2011  . Zoster 11/22/2013  . Zoster Recombinat (Shingrix) 08/28/2018, 11/04/2018    Qualifies for Shingles Vaccine? Completed series  Tdap: Up to date  Flu Vaccine: Up to date  Pneumococcal Vaccine: Completed series  Screening Tests Health Maintenance  Topic Date Due  . OPHTHALMOLOGY EXAM  05/22/2019  . URINE MICROALBUMIN  11/28/2019  . FOOT EXAM  11/28/2019  . HEMOGLOBIN A1C  03/06/2020  . TETANUS/TDAP  09/06/2021  . INFLUENZA VACCINE  Completed  . PNA vac Low Risk Adult  Completed   Cancer Screenings:  Colorectal Screening: No longer required.   Lung Cancer Screening: (Low Dose CT Chest recommended if Age 39-80 years, 30 pack-year currently smoking OR have quit w/in 15years.) does not qualify.   Additional Screening:  Dental Screening: Recommended annual dental exams for proper oral hygiene  Community Resource Referral:  CRR required this visit?  No        Plan:  I have personally reviewed and addressed the Medicare Annual Wellness questionnaire and have noted the following in the patient's chart:  A. Medical and social history B. Use of alcohol, tobacco or illicit drugs  C. Current medications and supplements D. Functional ability and status E.  Nutritional status F.  Physical activity G. Advance directives H. List of other physicians I.  Hospitalizations,  surgeries, and ER visits in previous 12 months J.  [place] such as hearing and vision if needed, cognitive and depression L. Referrals and appointments   In addition, I have reviewed and discussed with patient certain preventive protocols, quality metrics, and best practice recommendations. A written personalized care plan for preventive services as well as general preventive health recommendations were provided to patient.   [place] Score, [place]  [id number] Nurse Health Advisor  Nurse Notes: Pt to schedule eye exam.   Reviewed Nurse Health Advisor's note and plan. Agree with documentation and recommendations.

## 2019-11-08 ENCOUNTER — Other Ambulatory Visit: Payer: Self-pay

## 2019-11-08 ENCOUNTER — Ambulatory Visit (INDEPENDENT_AMBULATORY_CARE_PROVIDER_SITE_OTHER): Payer: Medicare Other

## 2019-11-08 DIAGNOSIS — Z0000 Encounter for general adult medical examination without abnormal findings: Secondary | ICD-10-CM

## 2019-11-08 NOTE — Patient Instructions (Addendum)
Howard [name] , Thank you for taking time to come for your Medicare Wellness Visit. I appreciate your ongoing commitment to your health goals. Please review the following plan we discussed and let me know if I can assist you in the future.   Screening recommendations/referrals: Colonoscopy: No longer required.  Recommended yearly ophthalmology/optometry visit for glaucoma screening and checkup Recommended yearly dental visit for hygiene and checkup  Vaccinations: Influenza vaccine: Up to date Pneumococcal vaccine: Completed series Tdap vaccine: Up to date, due 08/2021 Shingles vaccine: Completed series    Advanced directives: Currently on file.  Conditions/risks identified: Continue to increase water intake to 6-8 8 oz glasses a day and work towards walking 3 days a week for at least 30 minutes at a time.   Next appointment: 12/06/19 @ 9:00 AM with Howard [name].   Preventive Care 83 Years and Older, Male Preventive care refers to lifestyle choices and visits with your health care provider that can promote health and wellness. What does preventive care include?  A yearly physical exam. This is also called an annual well check.  Dental exams once or twice a year.  Routine eye exams. Ask your health care provider how often you should have your eyes checked.  Personal lifestyle choices, including:  Daily care of your teeth and gums.  Regular physical activity.  Eating a healthy diet.  Avoiding tobacco and drug use.  Limiting alcohol use.  Practicing safe sex.  Taking low doses of aspirin every day.  Taking vitamin and mineral supplements as recommended by your health care provider. What happens during an annual well check? The services and screenings done by your health care provider during your annual well check will depend on your age, overall health, lifestyle risk factors, and family history of disease. Counseling  Your health care provider may ask you questions  about your:  Alcohol use.  Tobacco use.  Drug use.  Emotional well-being.  Home and relationship well-being.  Sexual activity.  Eating habits.  History of falls.  Memory and ability to understand (cognition).  Work and work Statistician. Screening  You may have the following tests or measurements:  Height, weight, and BMI.  Blood pressure.  Lipid and cholesterol levels. These may be checked every 5 years, or more frequently if you are over 60 years old.  Skin check.  Lung cancer screening. You may have this screening every year starting at age 76 if you have a 30-pack-year history of smoking and currently smoke or have quit within the past 15 years.  Fecal occult blood test (FOBT) of the stool. You may have this test every year starting at age 63.  Flexible sigmoidoscopy or colonoscopy. You may have a sigmoidoscopy every 5 years or a colonoscopy every 10 years starting at age 45.  Prostate cancer screening. Recommendations will vary depending on your family history and other risks.  Hepatitis C blood test.  Hepatitis B blood test.  Sexually transmitted disease (STD) testing.  Diabetes screening. This is done by checking your blood sugar (glucose) after you have not eaten for a while (fasting). You may have this done every 1-3 years.  Abdominal aortic aneurysm (AAA) screening. You may need this if you are a current or former smoker.  Osteoporosis. You may be screened starting at age 10 if you are at high risk. Talk with your health care provider about your test results, treatment options, and if necessary, the need for more tests. Vaccines  Your health care provider may recommend  certain vaccines, such as:  Influenza vaccine. This is recommended every year.  Tetanus, diphtheria, and acellular pertussis (Tdap, Td) vaccine. You may need a Td booster every 10 years.  Zoster vaccine. You may need this after age 34.  Pneumococcal 13-valent conjugate (PCV13)  vaccine. One dose is recommended after age 34.  Pneumococcal polysaccharide (PPSV23) vaccine. One dose is recommended after age 17. Talk to your health care provider about which screenings and vaccines you need and how often you need them. This information is not intended to replace advice given to you by your health care provider. Make sure you discuss any questions you have with your health care provider. Document Released: 01/05/2016 Document Revised: 08/28/2016 Document Reviewed: 10/10/2015 Elsevier Interactive Patient Education  2017 [employer] Prevention in the Home Falls can cause injuries. They can happen to people of all ages. There are many things you can do to make your home safe and to help prevent falls. What can I do on the outside of my home?  Regularly fix the edges of walkways and driveways and fix any cracks.  Remove anything that might make you trip as you walk through a door, such as a raised step or threshold.  Trim any bushes or trees on the path to your home.  Use bright outdoor lighting.  Clear any walking paths of anything that might make someone trip, such as rocks or tools.  Regularly check to see if handrails are loose or broken. Make sure that both sides of any steps have handrails.  Any raised decks and porches should have guardrails on the edges.  Have any leaves, snow, or ice cleared regularly.  Use sand or salt on walking paths during winter.  Clean up any spills in your garage right away. This includes oil or grease spills. What can I do in the bathroom?  Use night lights.  Install grab bars by the toilet and in the tub and shower. Do not use towel bars as grab bars.  Use non-skid mats or decals in the tub or shower.  If you need to sit down in the shower, use a plastic, non-slip stool.  Keep the floor dry. Clean up any water that spills on the floor as soon as it happens.  Remove soap buildup in the tub or shower regularly.   Attach bath mats securely with double-sided non-slip rug tape.  Do not have throw rugs and other things on the floor that can make you trip. What can I do in the bedroom?  Use night lights.  Make sure that you have a light by your bed that is easy to reach.  Do not use any sheets or blankets that are too big for your bed. They should not hang down onto the floor.  Have a firm chair that has side arms. You can use this for support while you get dressed.  Do not have throw rugs and other things on the floor that can make you trip. What can I do in the kitchen?  Clean up any spills right away.  Avoid walking on wet floors.  Keep items that you use a lot in easy-to-reach places.  If you need to reach something above you, use a strong step stool that has a grab bar.  Keep electrical cords out of the way.  Do not use floor polish or wax that makes floors slippery. If you must use wax, use non-skid floor wax.  Do not have throw rugs and other  things on the floor that can make you trip. What can I do with my stairs?  Do not leave any items on the stairs.  Make sure that there are handrails on both sides of the stairs and use them. Fix handrails that are broken or loose. Make sure that handrails are as long as the stairways.  Check any carpeting to make sure that it is firmly attached to the stairs. Fix any carpet that is loose or worn.  Avoid having throw rugs at the top or bottom of the stairs. If you do have throw rugs, attach them to the floor with carpet tape.  Make sure that you have a light switch at the top of the stairs and the bottom of the stairs. If you do not have them, ask someone to add them for you. What else can I do to help prevent falls?  Wear shoes that:  Do not have high heels.  Have rubber bottoms.  Are comfortable and fit you well.  Are closed at the toe. Do not wear sandals.  If you use a stepladder:  Make sure that it is fully opened. Do not climb  a closed stepladder.  Make sure that both sides of the stepladder are locked into place.  Ask someone to hold it for you, if possible.  Clearly mark and make sure that you can see:  Any grab bars or handrails.  First and last steps.  Where the edge of each step is.  Use tools that help you move around (mobility aids) if they are needed. These include:  Canes.  Walkers.  Scooters.  Crutches.  Turn on the lights when you go into a dark area. Replace any light bulbs as soon as they burn out.  Set up your furniture so you have a clear path. Avoid moving your furniture around.  If any of your floors are uneven, fix them.  If there are any pets around you, be aware of where they are.  Review your medicines with your doctor. Some medicines can make you feel dizzy. This can increase your chance of falling. Ask your doctor what other things that you can do to help prevent falls. This information is not intended to replace advice given to you by your health care provider. Make sure you discuss any questions you have with your health care provider. Document Released: 10/05/2009 Document Revised: 05/16/2016 Document Reviewed: 01/13/2015 Elsevier Interactive Patient Education  2017 [employer].

## 2019-11-30 ENCOUNTER — Other Ambulatory Visit: Payer: Self-pay | Admitting: Cardiovascular Disease

## 2019-12-01 NOTE — Telephone Encounter (Signed)
Please schedule F/U appointment with Dr. [name]. Thank you!

## 2019-12-01 NOTE — Telephone Encounter (Signed)
I reviewed the patient's chart. Dr. Caryl Comes told him 10/20/18 to decrease spironolactone 50 mg- to 1/2 tablet (25 mg) by mouth once a day.  On 11/16/18 Dr. [name] increased the spironolactone back to 50 mg once daily per a patient advise request message.  I don't see anything after that stating the patient was back to taking spironolactone 50 mg- 1/2 tablet (25 mg) once daily. All documentation I see says he is on spironolactone 50 mg once daily.  Has that patient said he is only taking a 1/2 tablet once daily?

## 2019-12-01 NOTE — Telephone Encounter (Signed)
Please review for refill. The patient was told by Dr. Caryl Comes to take Sprionolactone 50 mg 1/2 tablet daily with blood work before his follow up to check on potassium level. The pharmacy is requesting Sprionolactone 50 mg one tablet daily. I believe this is an old Rx but the most recent discharge summary from [facility] stated Sprionolactone 50 mg one tablet daily.

## 2019-12-03 ENCOUNTER — Encounter: Payer: Self-pay | Admitting: Internal Medicine

## 2019-12-03 ENCOUNTER — Ambulatory Visit (INDEPENDENT_AMBULATORY_CARE_PROVIDER_SITE_OTHER): Payer: Medicare Other | Admitting: Internal Medicine

## 2019-12-03 ENCOUNTER — Other Ambulatory Visit: Payer: Self-pay

## 2019-12-03 VITALS — BP 120/68 | HR 70 | Temp 98.0°F | Ht 69.5 in | Wt 185.0 lb

## 2019-12-03 DIAGNOSIS — I10 Essential (primary) hypertension: Secondary | ICD-10-CM | POA: Diagnosis not present

## 2019-12-03 DIAGNOSIS — I442 Atrioventricular block, complete: Secondary | ICD-10-CM | POA: Diagnosis not present

## 2019-12-03 DIAGNOSIS — I509 Heart failure, unspecified: Secondary | ICD-10-CM | POA: Diagnosis not present

## 2019-12-03 DIAGNOSIS — I428 Other cardiomyopathies: Secondary | ICD-10-CM

## 2019-12-03 DIAGNOSIS — I701 Atherosclerosis of renal artery: Secondary | ICD-10-CM | POA: Diagnosis not present

## 2019-12-03 DIAGNOSIS — Z79899 Other long term (current) drug therapy: Secondary | ICD-10-CM | POA: Diagnosis not present

## 2019-12-03 DIAGNOSIS — E875 Hyperkalemia: Secondary | ICD-10-CM | POA: Diagnosis not present

## 2019-12-03 DIAGNOSIS — Z950 Presence of cardiac pacemaker: Secondary | ICD-10-CM

## 2019-12-03 MED ORDER — SPIRONOLACTONE 25 MG PO TABS: 25.0000 mg | ORAL_TABLET | Freq: Every day | ORAL | 6 refills | Status: DC

## 2019-12-03 NOTE — Progress Notes (Signed)
,      Patient Care Team: Chrismon, [name], PA as PCP - General (Family Medicine) [name], MD as Consulting Physician (Cardiology) Dasher, [name], MD as Consulting Physician (Dermatology) Birder Robson, MD as Referring Physician (Ophthalmology) [name], MD as Consulting Physician (Vascular Surgery) Deboraha Sprang, MD as Consulting Physician (Cardiology) Lucky Cowboy Erskine Squibb, MD as Referring Physician (Vascular Surgery)   HPI  Howard [name] is a 85 y.o. male Seen for pacemaker follow-up for device implanted for complete heart block 12/11.  Underwent generator replacement 7/20  No complaints  No erythema or fever or tenderness      He has been followed by Dr. Rayann Heman and [name].  He has nonobstructive coronary disease with a modest nonischemic cardiomyopathy  The patient denies chest pain, shortness of breath, nocturnal dyspnea, orthopnea; isolated LLE edema-mild.  There have been no palpitations, lightheadedness or syncope.    DATE TEST EF   8/15 Howard  35-40 %   5/16 Howard  45-50 %   6/19 Howard 35-40%   8/19 MYOVIEW 33%    Date Cr K Hgb  9/19 0.85 5.2 14.5   6/20 1.08    9/20 1.31 5.3 11.1     Records and Results Reviewed  Past Medical History:  Diagnosis Date  . Arthritis   . Bell palsy   . Cancer [facility])    prostate and skin  . Chronic combined systolic and diastolic CHF, NYHA class 1 ([place])    a. 07/2014 Howard: EF 35-40%, Gr 1 DD.  Marland Kitchen Complete heart block ([place])    a. 11/2010 s/p SJM 2210 [employer] DC PPM, ser# [id number].  . Depression   . Diabetes mellitus without complication ([place])   . Fall 11-10-14  . GERD (gastroesophageal reflux disease)   . History of prostate cancer   . Hyperlipidemia   . Hypertension   . LBBB (left bundle branch block)   . Left-sided Bell's palsy   . Lung cancer ([place]) 2016  . NICM (nonischemic cardiomyopathy) ([place])    a. 07/2014 Howard: EF 35-40%, mid-apicalanteroseptal DK, Gr 1 DD, mild-mod dil LA.  .  Non-obstructive CAD    a. 07/2014 Abnl MV;  b. 08/2014 Cath: LM nl, LAD 30p, RI 40p, LCX nl, OM1 40, RCA dominant 30p, 70d-->Med Rx.  Marland Kitchen Poor balance   . Presence of permanent cardiac pacemaker   . Sleep apnea    a. cpap  . Vertigo   . WPW (Wolff-Parkinson-White syndrome)    a. S/P RFCA 1991.    Past Surgical History:  Procedure Laterality Date  . ABDOMINAL AORTIC ENDOVASCULAR STENT GRAFT  08/25/2019   Procedure: ABDOMINAL AORTIC ENDOVASCULAR STENT GRAFT;  Surgeon: Algernon Huxley, MD;  Location: [facility] ORS;  Service: Vascular;;  . ANGIOPLASTY Left 08/25/2019   Procedure: ANGIOPLASTY;  Surgeon: Algernon Huxley, MD;  Location: [facility] ORS;  Service: Vascular;  Laterality: Left;  left SFA and stent placement  . APPLICATION OF WOUND VAC Left 06/07/2015   Procedure: APPLICATION OF WOUND VAC;  Surgeon: Robert Bellow, MD;  Location: [facility] ORS;  Service: General;  Laterality: Left;  left upper back  . BACK SURGERY     2011  . CARDIAC CATHETERIZATION  08/26/2014   Single vessel obstructive CAD  . CARPAL TUNNEL RELEASE  04-04-15   [facility]  . CATARACT EXTRACTION  07-31-11 and 09-18-11  . Catheter ablation  1991   for WPW  . cervical fusion    . CHOLECYSTECTOMY  09-07-14  .  ENDARTERECTOMY FEMORAL Left 08/25/2019   Procedure: ENDARTERECTOMY FEMORAL;  Surgeon: Algernon Huxley, MD;  Location: [facility] ORS;  Service: Vascular;  Laterality: Left;  common and produndis   . ENDOVASCULAR REPAIR/STENT GRAFT Right 08/25/2019   Procedure: ENDOVASCULAR REPAIR/STENT GRAFT;  Surgeon: Algernon Huxley, MD;  Location: [facility] ORS;  Service: Vascular;  Laterality: Right;  renal artery  . HAND SURGERY     right 1993; left 2005  . HERNIA REPAIR  1955  . INSERT / REPLACE / REMOVE PACEMAKER    . INSERTION OF ILIAC STENT Bilateral 08/25/2019   Procedure: INSERTION OF ILIAC STENT;  Surgeon: Algernon Huxley, MD;  Location: [facility] ORS;  Service: Vascular;  Laterality: Bilateral;  . JOINT REPLACEMENT Left 2013   knee  . JOINT REPLACEMENT Right 2004   knee  .  KNEE SURGERY     left knee 1991 and 1992; right knee 1995  . LEFT HEART CATHETERIZATION WITH CORONARY ANGIOGRAM N/A 08/26/2014   Procedure: LEFT HEART CATHETERIZATION WITH CORONARY ANGIOGRAM;  Surgeon: [name], MD;  Location: [facility] CATH LAB;  Service: Cardiovascular;  Laterality: N/A;  . LOWER EXTREMITY ANGIOGRAPHY Left 08/23/2019   Procedure: Lower Extremity Angiography;  Surgeon: Algernon Huxley, MD;  Location: [place] CV LAB;  Service: Cardiovascular;  Laterality: Left;  . LUMBAR LAMINECTOMY/DECOMPRESSION MICRODISCECTOMY N/A 06/07/2014   Procedure: LUMBAR FOUR TO FIVE LUMBAR LAMINECTOMY/DECOMPRESSION MICRODISCECTOMY 1 LEVEL;  Surgeon: [name], MD;  Location: [place] NEURO ORS;  Service: Neurosurgery;  Laterality: N/A;  . LUNG BIOPSY Right 2016   Dr [name]  . PACEMAKER INSERTION     PPM-- St Jude 11/30/10 by [name]  . PPM GENERATOR CHANGEOUT N/A 07/09/2019   Procedure: PPM GENERATOR CHANGEOUT;  Surgeon: Deboraha Sprang, MD;  Location: [place] CV LAB;  Service: Cardiovascular;  Laterality: N/A;  . PROSTATE SURGERY     cancer--1998, prostatectomy  . REPLACEMENT TOTAL KNEE     2004  . ruptured disc     1962 and 1998  . TEE WITHOUT CARDIOVERSION N/A 09/01/2019   Procedure: TRANSESOPHAGEAL ECHOCARDIOGRAM (TEE);  Surgeon: [name], MD;  Location: [facility] ORS;  Service: Cardiovascular;  Laterality: N/A;  . TEMPORARY PACEMAKER N/A 07/09/2019   Procedure: TEMPORARY PACEMAKER;  Surgeon: Deboraha Sprang, MD;  Location: [place] CV LAB;  Service: Cardiovascular;  Laterality: N/A;  . TRIGGER FINGER RELEASE  01-24-15  . WOUND DEBRIDEMENT Left 06/07/2015   Procedure: DEBRIDEMENT WOUND;  Surgeon: Robert Bellow, MD;  Location: [facility] ORS;  Service: General;  Laterality: Left;  left upper back    Current Outpatient Medications  Medication Sig Dispense Refill  . apixaban (ELIQUIS) 5 MG TABS tablet Take 1 tablet (5 mg total) by mouth 2 (two) times daily. 60 tablet 60  . aspirin EC 81 MG tablet Take  81 mg by mouth daily.    . Cholecalciferol (VITAMIN D3) 1000 UNITS CAPS Take 1,000 Units by mouth daily.     . fluticasone (FLONASE) 50 MCG/ACT nasal spray Place 2 sprays into both nostrils daily.    . isosorbide mononitrate (IMDUR) 30 MG 24 hr tablet Take 1 tablet (30 mg total) by mouth daily. 90 tablet 2  . metFORMIN (GLUCOPHAGE) 500 MG tablet Take 1 tablet (500 mg total) by mouth 2 (two) times daily with a meal. 180 tablet 3  . metoprolol succinate (TOPROL-XL) 25 MG 24 hr tablet Take 1 tablet (25 mg) by mouth once daily at bedtime (Patient taking differently: Take 25 mg by  mouth at bedtime. ) 90 tablet 3  . montelukast (SINGULAIR) 10 MG tablet Take 10 mg by mouth daily as needed (sneezing/allergies.).     Marland Kitchen Multiple Vitamin (MULTIVITAMIN WITH MINERALS) TABS tablet Take 1 tablet by mouth daily. One-A-Day Multivitamin    . mupirocin ointment (BACTROBAN) 2 % Apply to affected area 3 times daily 22 g 0  . omeprazole (PRILOSEC) 40 MG capsule Take 1 capsule (40 mg total) by mouth 2 (two) times daily as needed. (Patient taking differently: Take 40 mg by mouth 2 (two) times a day. ) 180 capsule 3  . sacubitril-valsartan (ENTRESTO) 24-26 MG Take 1 tablet by mouth 2 (two) times daily. 90 tablet 2  . simvastatin (ZOCOR) 40 MG tablet TAKE 1 TABLET AT BEDTIME 90 tablet 0  . spironolactone (ALDACTONE) 50 MG tablet TAKE 1 TABLET ONCE DAILY INTHE MORNING 90 tablet 3   No current facility-administered medications for this visit.    Allergies  Allergen Reactions  . Sulfa Antibiotics Rash      Review of Systems negative except from HPI and PMH  Physical Exam BP 120/68 (BP Location: Right Arm, Patient Position: Sitting, Cuff Size: Normal)   Pulse 70   Temp 98 F (36.7 C)   Ht 5' 9.5" (1.765 m)   Wt 185 lb (83.9 kg)   BMI 26.93 kg/m   Well developed and well nourished in no acute distress HENT normal Neck supple with JVP-flat Clear Device pocket well healed; without hematoma or erythema.  There  is no tethering  Regular rate and rhythm, no   murmur Abd-soft with active BS No Clubbing cyanosis   edema Skin-warm and dry A & Oriented  Grossly normal sensory and motor function  ECG sinus @ 83 P-synchronous/ AV  pacing     Assessment and  Plan Nonischemic cardiomyopathy  Complete heart block  Pacemaker-Saint Jude The patient's device was interrogated.  The information was reviewed. No changes were made in the programming.     Congestive heart failure class 3  Hypertension  Hyperkalemia   Euvolemic continue current meds  Blood pressure is better.  We will decrease his spironolactone. And check BMET in 10 days

## 2019-12-03 NOTE — Patient Instructions (Signed)
Medication Instructions:  - Your physician has recommended you make the following change in your medication:   1) Decrease spironolactone to 25 mg- take 1 tablet (25 mg) by mouth once a day  *If you need a refill on your cardiac medications before your next appointment, please call your pharmacy*  Lab Work: - Your physician recommends that you return for lab work in: 1 week- BMP (Friday 12/10/19) - [place] entrance at [facility]: 1st desk on the right to check in - Lab hours: 7:30 am- 5:30 pm  If you have labs (blood work) drawn today and your tests are completely normal, you will receive your results only by: Marland Kitchen MyChart Message (if you have MyChart) OR . A paper copy in the mail If you have any lab test that is abnormal or we need to change your treatment, we will call you to review the results.  Testing/Procedures: - none ordered  Follow-Up: At [facility], you and your health needs are our priority.  As part of our continuing mission to provide you with exceptional heart care, we have created designated Provider Care Teams.  These Care Teams include your primary Cardiologist (physician) and Advanced Practice Providers (APPs -  Physician Assistants and Nurse Practitioners) who all work together to provide you with the care you need, when you need it.  Your next appointment:   9 month(s)  The format for your next appointment:   In Person  Provider:   [name], MD  Other Instructions n/a

## 2019-12-06 ENCOUNTER — Other Ambulatory Visit: Payer: Self-pay

## 2019-12-06 ENCOUNTER — Ambulatory Visit (INDEPENDENT_AMBULATORY_CARE_PROVIDER_SITE_OTHER): Payer: Medicare Other | Admitting: Family Medicine

## 2019-12-06 ENCOUNTER — Encounter: Payer: Self-pay | Admitting: Family Medicine

## 2019-12-06 VITALS — BP 140/74 | HR 69 | Temp 96.6°F | Wt 186.0 lb

## 2019-12-06 DIAGNOSIS — I428 Other cardiomyopathies: Secondary | ICD-10-CM | POA: Diagnosis not present

## 2019-12-06 DIAGNOSIS — Z950 Presence of cardiac pacemaker: Secondary | ICD-10-CM

## 2019-12-06 DIAGNOSIS — E782 Mixed hyperlipidemia: Secondary | ICD-10-CM

## 2019-12-06 DIAGNOSIS — I701 Atherosclerosis of renal artery: Secondary | ICD-10-CM | POA: Diagnosis not present

## 2019-12-06 DIAGNOSIS — I509 Heart failure, unspecified: Secondary | ICD-10-CM | POA: Diagnosis not present

## 2019-12-06 DIAGNOSIS — E119 Type 2 diabetes mellitus without complications: Secondary | ICD-10-CM

## 2019-12-06 DIAGNOSIS — I10 Essential (primary) hypertension: Secondary | ICD-10-CM

## 2019-12-06 DIAGNOSIS — I442 Atrioventricular block, complete: Secondary | ICD-10-CM | POA: Diagnosis not present

## 2019-12-06 NOTE — Progress Notes (Signed)
Howard [name]  MRN: [id number] DOB: [birth year]  Subjective:  HPI   The patient is an 85 year old male who presents for follow up of diabetes.  His last A1C was done on 09/07/19 and was 7.2 which was down from 8.0. He was also found to have a potassium of 5.3 at the time of that last blood draw, along with Hemoglobin of 11.1.   The patient states he was seen by cardiology 3 days ago and they cut one of his medications but he does not know which one. Per the note in the chart it is his spironolactone.  the patient states that this is only temporary.   Patient Active Problem List   Diagnosis Date Noted  . Thromboembolism ([place]) 08/30/2019  . Atherosclerosis of native arteries of extremity with intermittent claudication ([place]) 08/23/2019  . Cellulitis 08/22/2019  . Swelling of limb 08/17/2019  . Pain in limb 08/17/2019  . NICM (nonischemic cardiomyopathy) ([place]) 07/01/2019  . CHF (congestive heart failure) ([place]) 07/01/2019  . Malignant neoplasm of right lung ([place]) 04/18/2016  . CAD (coronary artery disease) 04/01/2016  . Adenocarcinoma ([place]) 04/01/2016  . Skin cyst 12/26/2015  . GERD (gastroesophageal reflux disease) 06/16/2015  . Abscess of back 06/06/2015  . Vertigo 03/27/2015  . Carpal tunnel syndrome 10/28/2014  . Status post cholecystectomy 09/29/2014  . Disease of digestive tract 09/29/2014  . Cardiomyopathy ([place]) 09/26/2014  . Type 2 diabetes mellitus without complications ([place])   . Sleep apnea   . Spinal stenosis, lumbar region, with neurogenic claudication 06/07/2014  . Lumbar stenosis with neurogenic claudication 06/07/2014  . Abnormal gait 08/20/2012  . H/O total knee replacement 08/20/2012  . Arthritis of knee, degenerative 08/20/2012  . Pacemaker-St.Jude 08/03/2012  . Cardiac conduction disorder 06/19/2012  . Acid reflux 06/18/2012  . 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 Triggering of digit 03/25/2012  . Hyperlipidemia 12/23/2011  . Essential hypertension 03/23/2011   . Atrioventricular block, complete ([place]) 03/23/2011  . Complete atrioventricular block ([place]) 03/23/2011    Past Medical History:  Diagnosis Date  . Arthritis   . Bell palsy   . Cancer [facility])    prostate and skin  . Chronic combined systolic and diastolic CHF, NYHA class 1 ([place])    a. 07/2014 Echo: EF 35-40%, Gr 1 DD.  Marland Kitchen Complete heart block ([place])    a. 11/2010 s/p SJM 2210 Accent DC PPM, ser# [id number].  . Depression   . Diabetes mellitus without complication ([place])   . Fall 11-10-14  . GERD (gastroesophageal reflux disease)   . History of prostate cancer   . Hyperlipidemia   . Hypertension   . LBBB (left bundle branch block)   . Left-sided Bell's palsy   . Lung cancer ([place]) 2016  . NICM (nonischemic cardiomyopathy) ([place])    a. 07/2014 Echo: EF 35-40%, mid-apicalanteroseptal DK, Gr 1 DD, mild-mod dil LA.  . Non-obstructive CAD    a. 07/2014 Abnl MV;  b. 08/2014 Cath: LM nl, LAD 30p, RI 40p, LCX nl, OM1 40, RCA dominant 30p, 70d-->Med Rx.  Marland Kitchen Poor balance   . Presence of permanent cardiac pacemaker   . Sleep apnea    a. cpap  . Vertigo   . WPW (Wolff-Parkinson-White syndrome)    a. S/P RFCA 1991.   Past Surgical History:  Procedure Laterality Date  . ABDOMINAL AORTIC ENDOVASCULAR STENT GRAFT  08/25/2019   Procedure: ABDOMINAL AORTIC ENDOVASCULAR STENT GRAFT;  Surgeon: Algernon Huxley, MD;  Location: [facility] ORS;  Service: Vascular;;  . ANGIOPLASTY Left 08/25/2019   Procedure: ANGIOPLASTY;  Surgeon: Algernon Huxley, MD;  Location: [facility] ORS;  Service: Vascular;  Laterality: Left;  left SFA and stent placement  . APPLICATION OF WOUND VAC Left 06/07/2015   Procedure: APPLICATION OF WOUND VAC;  Surgeon: Robert Bellow, MD;  Location: [facility] ORS;  Service: General;  Laterality: Left;  left upper back  . BACK SURGERY     2011  . CARDIAC CATHETERIZATION  08/26/2014   Single vessel obstructive CAD  . CARPAL TUNNEL RELEASE  04-04-15   [facility]  . CATARACT EXTRACTION  07-31-11 and 09-18-11  . Catheter  ablation  1991   for WPW  . cervical fusion    . CHOLECYSTECTOMY  09-07-14  . ENDARTERECTOMY FEMORAL Left 08/25/2019   Procedure: ENDARTERECTOMY FEMORAL;  Surgeon: Algernon Huxley, MD;  Location: [facility] ORS;  Service: Vascular;  Laterality: Left;  common and produndis   . ENDOVASCULAR REPAIR/STENT GRAFT Right 08/25/2019   Procedure: ENDOVASCULAR REPAIR/STENT GRAFT;  Surgeon: Algernon Huxley, MD;  Location: [facility] ORS;  Service: Vascular;  Laterality: Right;  renal artery  . HAND SURGERY     right 1993; left 2005  . HERNIA REPAIR  1955  . INSERT / REPLACE / REMOVE PACEMAKER    . INSERTION OF ILIAC STENT Bilateral 08/25/2019   Procedure: INSERTION OF ILIAC STENT;  Surgeon: Algernon Huxley, MD;  Location: [facility] ORS;  Service: Vascular;  Laterality: Bilateral;  . JOINT REPLACEMENT Left 2013   knee  . JOINT REPLACEMENT Right 2004   knee  . KNEE SURGERY     left knee 1991 and 1992; right knee 1995  . LEFT HEART CATHETERIZATION WITH CORONARY ANGIOGRAM N/A 08/26/2014   Procedure: LEFT HEART CATHETERIZATION WITH CORONARY ANGIOGRAM;  Surgeon: [name], MD;  Location: [facility] CATH LAB;  Service: Cardiovascular;  Laterality: N/A;  . LOWER EXTREMITY ANGIOGRAPHY Left 08/23/2019   Procedure: Lower Extremity Angiography;  Surgeon: Algernon Huxley, MD;  Location: [place] CV LAB;  Service: Cardiovascular;  Laterality: Left;  . LUMBAR LAMINECTOMY/DECOMPRESSION MICRODISCECTOMY N/A 06/07/2014   Procedure: LUMBAR FOUR TO FIVE LUMBAR LAMINECTOMY/DECOMPRESSION MICRODISCECTOMY 1 LEVEL;  Surgeon: [name], MD;  Location: [place] NEURO ORS;  Service: Neurosurgery;  Laterality: N/A;  . LUNG BIOPSY Right 2016   Dr [name]  . PACEMAKER INSERTION     PPM-- St Jude 11/30/10 by [name]  . PPM GENERATOR CHANGEOUT N/A 07/09/2019   Procedure: PPM GENERATOR CHANGEOUT;  Surgeon: Deboraha Sprang, MD;  Location: [place] CV LAB;  Service: Cardiovascular;  Laterality: N/A;  . PROSTATE SURGERY     cancer--1998, prostatectomy  . REPLACEMENT TOTAL KNEE      2004  . ruptured disc     1962 and 1998  . TEE WITHOUT CARDIOVERSION N/A 09/01/2019   Procedure: TRANSESOPHAGEAL ECHOCARDIOGRAM (TEE);  Surgeon: [name], MD;  Location: [facility] ORS;  Service: Cardiovascular;  Laterality: N/A;  . TEMPORARY PACEMAKER N/A 07/09/2019   Procedure: TEMPORARY PACEMAKER;  Surgeon: Deboraha Sprang, MD;  Location: [place] CV LAB;  Service: Cardiovascular;  Laterality: N/A;  . TRIGGER FINGER RELEASE  01-24-15  . WOUND DEBRIDEMENT Left 06/07/2015   Procedure: DEBRIDEMENT WOUND;  Surgeon: Robert Bellow, MD;  Location: [facility] ORS;  Service: General;  Laterality: Left;  left upper back   Family History  Problem Relation Age of Onset  . Heart attack Mother   . Hyperlipidemia Mother   . CAD Other   .  Prostate cancer Neg Hx    Social History   Socioeconomic History  . Marital status: Married    Spouse name: Not on file  . Number of children: 2  . Years of education: College  . Highest education level: Some college, no degree  Occupational History  . Occupation: Retired  Tobacco Use  . Smoking status: Former Smoker    Years: 4.00  . Smokeless tobacco: Never Used  . Tobacco comment: Quit 2011  Substance and Sexual Activity  . Alcohol use: No  . Drug use: No  . Sexual activity: Not on file  Other Topics Concern  . Not on file  Social History Narrative   Drinks 2 cups of coffee a day    Social Determinants of Health   Financial Resource Strain:   . Difficulty of Paying Living Expenses: Not on file  Food Insecurity:   . Worried About Charity fundraiser in the Last Year: Not on file  . Ran Out of Food in the Last Year: Not on file  Transportation Needs:   . Lack of Transportation (Medical): Not on file  . Lack of Transportation (Non-Medical): Not on file  Physical Activity:   . Days of Exercise per Week: Not on file  . Minutes of Exercise per Session: Not on file  Stress:   . Feeling of Stress : Not on file  Social Connections:   .  Frequency of Communication with Friends and Family: Not on file  . Frequency of Social Gatherings with Friends and Family: Not on file  . Attends Religious Services: Not on file  . Active Member of Clubs or Organizations: Not on file  . Attends Archivist Meetings: Not on file  . Marital Status: Not on file  Intimate Partner Violence:   . Fear of Current or Ex-Partner: Not on file  . Emotionally Abused: Not on file  . Physically Abused: Not on file  . Sexually Abused: Not on file    Outpatient Encounter Medications as of 12/06/2019  Medication Sig  . apixaban (ELIQUIS) 5 MG TABS tablet Take 1 tablet (5 mg total) by mouth 2 (two) times daily.  Marland Kitchen aspirin EC 81 MG tablet Take 81 mg by mouth daily.  . Cholecalciferol (VITAMIN D3) 1000 UNITS CAPS Take 1,000 Units by mouth daily.   . fluticasone (FLONASE) 50 MCG/ACT nasal spray Place 2 sprays into both nostrils daily.  . isosorbide mononitrate (IMDUR) 30 MG 24 hr tablet Take 1 tablet (30 mg total) by mouth daily.  . metFORMIN (GLUCOPHAGE) 500 MG tablet Take 1 tablet (500 mg total) by mouth 2 (two) times daily with a meal.  . metoprolol succinate (TOPROL-XL) 25 MG 24 hr tablet Take 1 tablet (25 mg) by mouth once daily at bedtime (Patient taking differently: Take 25 mg by mouth at bedtime. )  . montelukast (SINGULAIR) 10 MG tablet Take 10 mg by mouth daily as needed (sneezing/allergies.).   Marland Kitchen Multiple Vitamin (MULTIVITAMIN WITH MINERALS) TABS tablet Take 1 tablet by mouth daily. One-A-Day Multivitamin  . mupirocin ointment (BACTROBAN) 2 % Apply to affected area 3 times daily  . omeprazole (PRILOSEC) 40 MG capsule Take 1 capsule (40 mg total) by mouth 2 (two) times daily as needed. (Patient taking differently: Take 40 mg by mouth 2 (two) times a day. )  . sacubitril-valsartan (ENTRESTO) 24-26 MG Take 1 tablet by mouth 2 (two) times daily.  . simvastatin (ZOCOR) 40 MG tablet TAKE 1 TABLET AT BEDTIME  . spironolactone (  ALDACTONE) 25 MG  tablet Take 1 tablet (25 mg total) by mouth daily.   No facility-administered encounter medications on file as of 12/06/2019.    Allergies  Allergen Reactions  . Sulfa Antibiotics Rash   Review of Systems  Constitutional: Negative for chills, diaphoresis, fever and malaise/fatigue.  HENT: Negative for congestion, ear pain, sinus pain and sore throat.   Respiratory: Negative for cough.   Cardiovascular: Negative for chest pain.  Gastrointestinal: Negative for abdominal pain and diarrhea.  Musculoskeletal: Negative for myalgias.  Neurological: Negative for headaches.    Objective:  BP 140/74 (BP Location: Right Arm, Patient Position: Sitting, Cuff Size: Normal)   Pulse 69   Temp (!) 96.6 F (35.9 C) (Skin)   Wt 186 lb (84.4 kg)   SpO2 97%   BMI 27.07 kg/m  Wt Readings from Last 3 Encounters:  12/06/19 186 lb (84.4 kg)  12/03/19 185 lb (83.9 kg)  09/08/19 180 lb (81.6 kg)   Physical Exam  Constitutional: He is oriented to person, place, and time and well-developed, well-nourished, and in no distress.  HENT:  Head: Normocephalic.  Eyes: Conjunctivae are normal.  Cardiovascular: Normal rate.  Pacemaker in left upper chest.  Pulmonary/Chest: Effort normal.  Abdominal: Soft.  Musculoskeletal:        General: Normal range of motion.     Cervical back: Neck supple.  Neurological: He is alert and oriented to person, place, and time.  Numbness in the left foot with history of vascular disease.  Skin: No rash noted.  Psychiatric: Mood, affect and judgment normal.    Assessment and Plan :  1. Type 2 diabetes mellitus without complication, without long-term current use of insulin (HCC) Hgb A1C down to 7.2% on 09-07-19. No hypoglycemic episodes. Numbness of the left foot since vascular surgery with stents for PAD. Scheduled for follow up with Dr. Lucky Cowboy (vascular surgeon) on 12-10-19. States legs are feeling much improved and no edema today. Denies coldness or episodes of cyanosis or  claudication. Walking with a crutch for support with neuropathy in the left foot. Recheck labs and continue Metformin 500 gm BID. - CBC with Differential - HgB A1c - Lipid Profile  2. NICM (nonischemic cardiomyopathy) ([place]) No chest pains recently. Tolerating Metoprolol Succinate 25 mg hs, Imdur 30 mg qd and plans to recheck BMP in 4 days to assess hyperkalemia. Ask he get CBC and Lipid Panel at the same time and follow up pending report. - CBC with Differential - Lipid Profile  3. Congestive heart failure, unspecified HF chronicity, unspecified heart failure type [facility]) Cardiologist has him on Entresto 24-26 BID with Spironolactone. No edema today. Feeling well. - CBC with Differential - Lipid Profile  4. Pacemaker-St.Jude Followed by Dr. Caryl Comes (cardiologist) and device interrogated as of 12-03-2019 with no changes made in the programming. Had the PPM generator change out accomplished 07-09-19. Feeling well without palpitations or chest pains.  5. Essential hypertension Good control on the Metoprolol Succinate and decrease of Spironolactone to 25 mg qd (dropped dosage due to hyperkalemia per cardiologist). Recheck electrolytes and renal function in 4 days. - CBC with Differential - Lipid Profile  6. Mixed hyperlipidemia Tolerating Simvastatin 40 mg qd without muscle or joint pains. Continue low fat diabetic diet and recheck labs. - Lipid Profile  7. Complete heart block (HCC) Feeling well. Had PPM Generator change out 07-09-19 by Dr. Caryl Comes. Most recent interrogation was on 12-03-19 with report of normal operation.

## 2019-12-10 ENCOUNTER — Other Ambulatory Visit
Admission: RE | Admit: 2019-12-10 | Discharge: 2019-12-10 | Disposition: A | Discharge status: Home / Self Care | Payer: Medicare Other | Attending: Internal Medicine | Admitting: Internal Medicine

## 2019-12-10 ENCOUNTER — Other Ambulatory Visit: Payer: Self-pay

## 2019-12-10 ENCOUNTER — Ambulatory Visit (INDEPENDENT_AMBULATORY_CARE_PROVIDER_SITE_OTHER): Payer: Medicare Other | Admitting: Vascular Surgery

## 2019-12-10 ENCOUNTER — Ambulatory Visit (INDEPENDENT_AMBULATORY_CARE_PROVIDER_SITE_OTHER): Payer: Medicare Other

## 2019-12-10 ENCOUNTER — Encounter (INDEPENDENT_AMBULATORY_CARE_PROVIDER_SITE_OTHER): Payer: Self-pay | Admitting: Vascular Surgery

## 2019-12-10 ENCOUNTER — Other Ambulatory Visit
Admission: RE | Admit: 2019-12-10 | Discharge: 2019-12-10 | Disposition: A | Discharge status: Home / Self Care | Payer: Medicare Other | Source: Home / Self Care | Attending: Family Medicine | Admitting: Family Medicine

## 2019-12-10 VITALS — Resp 16 | Wt 183.0 lb

## 2019-12-10 DIAGNOSIS — E875 Hyperkalemia: Secondary | ICD-10-CM | POA: Insufficient documentation

## 2019-12-10 DIAGNOSIS — I701 Atherosclerosis of renal artery: Secondary | ICD-10-CM

## 2019-12-10 DIAGNOSIS — Z79899 Other long term (current) drug therapy: Secondary | ICD-10-CM | POA: Diagnosis present

## 2019-12-10 DIAGNOSIS — I714 Abdominal aortic aneurysm, without rupture, unspecified: Secondary | ICD-10-CM

## 2019-12-10 DIAGNOSIS — I70212 Atherosclerosis of native arteries of extremities with intermittent claudication, left leg: Secondary | ICD-10-CM

## 2019-12-10 DIAGNOSIS — M7989 Other specified soft tissue disorders: Secondary | ICD-10-CM | POA: Diagnosis not present

## 2019-12-10 DIAGNOSIS — I10 Essential (primary) hypertension: Secondary | ICD-10-CM | POA: Diagnosis not present

## 2019-12-10 DIAGNOSIS — I739 Peripheral vascular disease, unspecified: Secondary | ICD-10-CM | POA: Insufficient documentation

## 2019-12-10 LAB — CBC WITH DIFFERENTIAL/PLATELET
Abs Immature Granulocytes: 0.01 10*3/uL (ref 0.00–0.07)
Basophils Absolute: 0.1 10*3/uL (ref 0.0–0.1)
Basophils Relative: 1 %
Eosinophils Absolute: 0.5 10*3/uL (ref 0.0–0.5)
Eosinophils Relative: 8 %
HCT: 40.4 % (ref 39.0–52.0)
Hemoglobin: 12.8 g/dL — ABNORMAL LOW (ref 13.0–17.0)
Immature Granulocytes: 0 %
Lymphocytes Relative: 33 %
Lymphs Abs: 2.1 10*3/uL (ref 0.7–4.0)
MCH: 24.6 pg — ABNORMAL LOW (ref 26.0–34.0)
MCHC: 31.7 g/dL (ref 30.0–36.0)
MCV: 77.7 fL — ABNORMAL LOW (ref 80.0–100.0)
Monocytes Absolute: 0.7 10*3/uL (ref 0.1–1.0)
Monocytes Relative: 10 %
Neutro Abs: 3.2 10*3/uL (ref 1.7–7.7)
Neutrophils Relative %: 48 %
Platelets: 272 10*3/uL (ref 150–400)
RBC: 5.2 MIL/uL (ref 4.22–5.81)
RDW: 15.5 % (ref 11.5–15.5)
WBC: 6.5 10*3/uL (ref 4.0–10.5)
nRBC: 0 % (ref 0.0–0.2)

## 2019-12-10 LAB — LIPID PANEL
Cholesterol: 136 mg/dL (ref 0–200)
HDL: 42 mg/dL (ref >40)
LDL Cholesterol: 75 mg/dL (ref 0–99)
Total CHOL/HDL Ratio: 3.2 RATIO
Triglycerides: 93 mg/dL (ref <150)
VLDL: 19 mg/dL (ref 0–40)

## 2019-12-10 LAB — BASIC METABOLIC PANEL
Anion gap: 10 (ref 5–15)
BUN: 18 mg/dL (ref 8–23)
CO2: 21 mmol/L — ABNORMAL LOW (ref 22–32)
Calcium: 9.6 mg/dL (ref 8.9–10.3)
Chloride: 106 mmol/L (ref 98–111)
Creatinine, Ser: 1.09 mg/dL (ref 0.61–1.24)
GFR calc Af Amer: >60 mL/min (ref >60)
GFR calc non Af Amer: >60 mL/min (ref >60)
Glucose, Bld: 131 mg/dL — ABNORMAL HIGH (ref 70–99)
Potassium: 4.6 mmol/L (ref 3.5–5.1)
Sodium: 137 mmol/L (ref 135–145)

## 2019-12-10 LAB — HEMOGLOBIN A1C
Hgb A1c MFr Bld: 7.5 % — ABNORMAL HIGH (ref 4.8–5.6)
Mean Plasma Glucose: 168.55 mg/dL

## 2019-12-10 NOTE — Assessment & Plan Note (Signed)
Mild at this point not that bothersome

## 2019-12-10 NOTE — Assessment & Plan Note (Signed)
blood pressure control important in reducing the progression of atherosclerotic disease. On appropriate oral medications.  

## 2019-12-10 NOTE — Assessment & Plan Note (Signed)
His ABIs today were normal with strong triphasic waveforms and good digital pressures consistent with successful revascularization with no lower extremity ischemia currently.  This is a marked improvement after his revascularization about 4 months ago.  Symptoms are markedly improved and he is no longer having numbness and rest pain to the severity that he had and he is walking much better.  Continue current medical regimen including Eliquis.  Plan to return in 6 months with noninvasive studies.

## 2019-12-10 NOTE — Assessment & Plan Note (Signed)
Renal duplex showed his right renal stent to be patent although the right kidney was smaller.  The left renal artery had no hemodynamically significant stenosis.  Blood pressure has been good.

## 2019-12-10 NOTE — Patient Instructions (Signed)
Peripheral Vascular Disease  Peripheral vascular disease (PVD) is a disease of the blood vessels that are not part of your heart and brain. A simple term for PVD is poor circulation. In most cases, PVD narrows the blood vessels that carry blood from your heart to the rest of your body. This can reduce the supply of blood to your arms, legs, and internal organs, like your stomach or kidneys. However, PVD most often affects a person's lower legs and feet. Without treatment, PVD tends to get worse. PVD can also lead to acute ischemic limb. This is when an arm or leg suddenly cannot get enough blood. This is a medical emergency. Follow these instructions at home: Lifestyle  Do not use any products that contain nicotine or tobacco, such as cigarettes and e-cigarettes. If you need help quitting, ask your doctor.  Lose weight if you are overweight. Or, stay at a healthy weight as told by your doctor.  Eat a diet that is low in fat and cholesterol. If you need help, ask your doctor.  Exercise regularly. Ask your doctor for activities that are right for you. General instructions  Take over-the-counter and prescription medicines only as told by your doctor.  Take good care of your feet: ? Wear comfortable shoes that fit well. ? Check your feet often for any cuts or sores.  Keep all follow-up visits as told by your doctor This is important. Contact a doctor if:  You have cramps in your legs when you walk.  You have leg pain when you are at rest.  You have coldness in a leg or foot.  Your skin changes.  You are unable to get or have an erection (erectile dysfunction).  You have cuts or sores on your feet that do not heal. Get help right away if:  Your arm or leg turns cold, numb, and blue.  Your arms or legs become red, warm, swollen, painful, or numb.  You have chest pain.  You have trouble breathing.  You suddenly have weakness in your face, arm, or leg.  You become very  confused or you cannot speak.  You suddenly have a very bad headache.  You suddenly cannot see. Summary  Peripheral vascular disease (PVD) is a disease of the blood vessels.  A simple term for PVD is poor circulation. Without treatment, PVD tends to get worse.  Treatment may include exercise, low fat and low cholesterol diet, and quitting smoking. This information is not intended to replace advice given to you by your health care provider. Make sure you discuss any questions you have with your health care provider. Document Released: 03/05/2010 Document Revised: 11/21/2017 Document Reviewed: 01/16/2017 Elsevier Patient Education  2020 [employer]  

## 2019-12-10 NOTE — Assessment & Plan Note (Signed)
Will check duplex at next visit

## 2019-12-10 NOTE — Progress Notes (Signed)
MRN : [id number]  Howard [name] is a 85 y.o. (1931/01/19) male who presents with chief complaint of No chief complaint on file. Marland Kitchen  History of Present Illness: Patient returns today in follow up of PAD and other vascular issues.  He looks great today.  He says his legs feel like he is years younger and he is walking much better.  He still has some occasional numbness in the left foot but this is markedly improved and he is feeling much better.  His blood pressure has been pretty good.  He has no new complaints today.  He has some occasional lower extremity swelling mostly in the right foot but it is fairly mild and not that bothersome.  His ABIs today were normal with strong triphasic waveforms and good digital pressures consistent with successful revascularization with no lower extremity ischemia currently.  Renal duplex showed his right renal stent to be patent although the right kidney was smaller.  The left renal artery had no hemodynamically significant stenosis.  Current Outpatient Medications  Medication Sig Dispense Refill  . apixaban (ELIQUIS) 5 MG TABS tablet Take 1 tablet (5 mg total) by mouth 2 (two) times daily. 60 tablet 60  . aspirin EC 81 MG tablet Take 81 mg by mouth daily.    . Cholecalciferol (VITAMIN D3) 1000 UNITS CAPS Take 1,000 Units by mouth daily.     . fluticasone (FLONASE) 50 MCG/ACT nasal spray Place 2 sprays into both nostrils daily.    . isosorbide mononitrate (IMDUR) 30 MG 24 hr tablet Take 1 tablet (30 mg total) by mouth daily. 90 tablet 2  . metFORMIN (GLUCOPHAGE) 500 MG tablet Take 1 tablet (500 mg total) by mouth 2 (two) times daily with a meal. 180 tablet 3  . metoprolol succinate (TOPROL-XL) 25 MG 24 hr tablet Take 1 tablet (25 mg) by mouth once daily at bedtime (Patient taking differently: Take 25 mg by mouth at bedtime. ) 90 tablet 3  . montelukast (SINGULAIR) 10 MG tablet Take 10 mg by mouth daily as needed (sneezing/allergies.).     Marland Kitchen Multiple Vitamin  (MULTIVITAMIN WITH MINERALS) TABS tablet Take 1 tablet by mouth daily. One-A-Day Multivitamin    . mupirocin ointment (BACTROBAN) 2 % Apply to affected area 3 times daily 22 g 0  . omeprazole (PRILOSEC) 40 MG capsule Take 1 capsule (40 mg total) by mouth 2 (two) times daily as needed. (Patient taking differently: Take 40 mg by mouth 2 (two) times a day. ) 180 capsule 3  . sacubitril-valsartan (ENTRESTO) 24-26 MG Take 1 tablet by mouth 2 (two) times daily. 90 tablet 2  . simvastatin (ZOCOR) 40 MG tablet TAKE 1 TABLET AT BEDTIME 90 tablet 0  . spironolactone (ALDACTONE) 25 MG tablet Take 1 tablet (25 mg total) by mouth daily. 30 tablet 6   No current facility-administered medications for this visit.    Past Medical History:  Diagnosis Date  . Arthritis   . Bell palsy   . Cancer [facility])    prostate and skin  . Chronic combined systolic and diastolic CHF, NYHA class 1 ([place])    a. 07/2014 Echo: EF 35-40%, Gr 1 DD.  Marland Kitchen Complete heart block ([place])    a. 11/2010 s/p SJM 2210 [employer] DC PPM, ser# [id number].  . Depression   . Diabetes mellitus without complication ([place])   . Fall 11-10-14  . GERD (gastroesophageal reflux disease)   . History of prostate cancer   . Hyperlipidemia   .  Hypertension   . LBBB (left bundle branch block)   . Left-sided Bell's palsy   . Lung cancer ([place]) 2016  . NICM (nonischemic cardiomyopathy) ([place])    a. 07/2014 Echo: EF 35-40%, mid-apicalanteroseptal DK, Gr 1 DD, mild-mod dil LA.  . Non-obstructive CAD    a. 07/2014 Abnl MV;  b. 08/2014 Cath: LM nl, LAD 30p, RI 40p, LCX nl, OM1 40, RCA dominant 30p, 70d-->Med Rx.  Marland Kitchen Poor balance   . Presence of permanent cardiac pacemaker   . Sleep apnea    a. cpap  . Vertigo   . WPW (Wolff-Parkinson-White syndrome)    a. S/P RFCA 1991.    Past Surgical History:  Procedure Laterality Date  . ABDOMINAL AORTIC ENDOVASCULAR STENT GRAFT  08/25/2019   Procedure: ABDOMINAL AORTIC ENDOVASCULAR STENT GRAFT;  Surgeon: Algernon Huxley, MD;   Location: [facility] ORS;  Service: Vascular;;  . ANGIOPLASTY Left 08/25/2019   Procedure: ANGIOPLASTY;  Surgeon: Algernon Huxley, MD;  Location: [facility] OR[facility];  Service: Vascular;  Laterality: Left;  left SFA and stent placement  . APPLICATION OF WOUND VAC Left 06/07/2015   Procedure: APPLICATION OF WOUND VAC;  Surgeon: Robert Bellow, MD;  Location: [facility];  Service: General;  Laterality: Left;  left upper back  . BACK SURGERY     2011  . CARDIAC CATHETERIZATION  08/26/2014   Single vessel obstructive CAD  . CARPAL TUNNEL RELEASE  04-04-15   [facility]  . CATARACT EXTRACTION  07-31-11 and 09-18-11  . Catheter ablation  1991   for WPW  . cervical fusion    . CHOLECYSTECTOMY  09-07-14  . ENDARTERECTOMY FEMORAL Left 08/25/2019   Procedure: ENDARTERECTOMY FEMORAL;  Surgeon: Algernon Huxley, MD;  Location: [facility];  Service: Vascular;  Laterality: Left;  common and produndis   . ENDOVASCULAR REPAIR/STENT GRAFT Right 08/25/2019   Procedure: ENDOVASCULAR REPAIR/STENT GRAFT;  Surgeon: Algernon Huxley, MD;  Location: [facility];  Service: Vascular;  Laterality: Right;  renal artery  . HAND SURGERY     right 1993; left 2005  . HERNIA REPAIR  1955  . INSERT / REPLACE / REMOVE PACEMAKER    . INSERTION OF ILIAC STENT Bilateral 08/25/2019   Procedure: INSERTION OF ILIAC STENT;  Surgeon: Algernon Huxley, MD;  Location: [facility];  Service: Vascular;  Laterality: Bilateral;  . JOINT REPLACEMENT Left 2013   knee  . JOINT REPLACEMENT Right 2004   knee  . KNEE SURGERY     left knee 1991 and 1992; right knee 1995  . LEFT HEART CATHETERIZATION WITH CORONARY ANGIOGRAM N/A 08/26/2014   Procedure: LEFT HEART CATHETERIZATION WITH CORONARY ANGIOGRAM;  Surgeon: [name], MD;  Location[facility] LAB;  Service: Cardiovascular;  Laterality: N/A;  . LOWER EXTREMITY ANGIOGRAPHY Left 08/23/2019   Procedure: Lower Extremity Angiography;  Surgeon: Algernon Huxley, MD;  Location: [place] CV LAB;  Service: Cardiovascular;  Laterality: Left;  . LUMBAR  LAMINECTOMY/DECOMPRESSION MICRODISCECTOMY N/A 06/07/2014   Procedure: LUMBAR FOUR TO FIVE LUMBAR LAMINECTOMY/DECOMPRESSION MICRODISCECTOMY 1 LEVEL;  Surgeon: [name], MD;  Location: [place] NEURO ORS;  Service: Neurosurgery;  Laterality: N/A;  . LUNG BIOPSY Right 2016   Dr [name]  . PACEMAKER INSERTION     PPM-- St Jude 11/30/10 by [name]  . PPM GENERATOR CHANGEOUT N/A 07/09/2019   Procedure: PPM GENERATOR CHANGEOUT;  Surgeon: Deboraha Sprang, MD;  Location: [place] CV LAB;  Service: Cardiovascular;  Laterality: N/A;  . PROSTATE SURGERY  cancer--1998, prostatectomy  . REPLACEMENT TOTAL KNEE     2004  . ruptured disc     1962 and 1998  . TEE WITHOUT CARDIOVERSION N/A 09/01/2019   Procedure: TRANSESOPHAGEAL ECHOCARDIOGRAM (TEE);  Surgeon: [name], MD;  Location: [facility];  Service: Cardiovascular;  Laterality: N/A;  . TEMPORARY PACEMAKER N/A 07/09/2019   Procedure: TEMPORARY PACEMAKER;  Surgeon: Deboraha Sprang, MD;  Location: [place] CV LAB;  Service: Cardiovascular;  Laterality: N/A;  . TRIGGER FINGER RELEASE  01-24-15  . WOUND DEBRIDEMENT Left 06/07/2015   Procedure: DEBRIDEMENT WOUND;  Surgeon: Robert Bellow, MD;  Location: [facility] OR[facility];  Service: General;  Laterality: Left;  left upper back     Social History   Tobacco Use  . Smoking status: Former Smoker    Years: 4.00  . Smokeless tobacco: Never Used  . Tobacco comment: Quit 2011  Substance Use Topics  . Alcohol use: No  . Drug use: No    Family History  Problem Relation Age of Onset  . Heart attack Mother   . Hyperlipidemia Mother   . CAD Other   . Prostate cancer Neg Hx      Allergies  Allergen Reactions  . Sulfa Antibiotics Rash     REVIEW OF SYSTEMS (Negative unless checked)  Constitutional: [] Weight loss  [] Fever  [] Chills Cardiac: [] Chest pain   [] Chest pressure   [] Palpitations   [] Shortness of breath when laying flat   [] Shortness of breath at rest   [] Shortness of breath with  exertion. Vascular:  [] Pain in legs with walking   [] Pain in legs at rest   [] Pain in legs when laying flat   [x] Claudication   [] Pain in feet when walking  [] Pain in feet at rest  [] Pain in feet when laying flat   [] History of DVT   [] Phlebitis   [] Swelling in legs   [] Varicose veins   [] Non-healing ulcers Pulmonary:   [] Uses home oxygen   [] Productive cough   [] Hemoptysis   [] Wheeze  [] COPD   [] Asthma Neurologic:  [] Dizziness  [] Blackouts   [] Seizures   [] History of stroke   [] History of TIA  [] Aphasia   [] Temporary blindness   [] Dysphagia   [] Weakness or numbness in arms   [x] Weakness or numbness in legs Musculoskeletal:  [] Arthritis   [] Joint swelling   [] Joint pain   [] Low back pain Hematologic:  [] Easy bruising  [] Easy bleeding   [] Hypercoagulable state   [] Anemic   Gastrointestinal:  [] Blood in stool   [] Vomiting blood  [] Gastroesophageal reflux/heartburn   [] Abdominal pain Genitourinary:  [] Chronic kidney disease   [] Difficult urination  [] Frequent urination  [] Burning with urination   [] Hematuria Skin:  [] Rashes   [] Ulcers   [] Wounds Psychological:  [] History of anxiety   []  History of major depression.  Physical Examination  There were no vitals taken for this visit. Gen:  WD/WN, NAD.  Appears younger than stated age Head: [place]/AT, No temporalis wasting. Ear/Nose/Throat: Hearing diminished, nares w/o erythema or drainage Eyes: Conjunctiva clear. Sclera non-icteric Neck: Supple.  Trachea midline Pulmonary:  Good air movement, no use of accessory muscles.  Cardiac: RRR, no JVD Vascular:  Vessel Right Left  Radial Palpable Palpable                          PT Palpable Palpable  DP Palpable Palpable   Gastrointestinal: soft, non-tender/non-distended.  Musculoskeletal: M/S 5/5 throughout.  No deformity or atrophy.  No significant lower  extremity edema today. Neurologic: Sensation grossly intact in extremities.  Symmetrical.  Speech is fluent.  Psychiatric: Judgment intact, Mood  & affect appropriate for pt's clinical situation. Dermatologic: No rashes or ulcers noted.  No cellulitis or open wounds.       Labs Recent Results (from the past 2160 hour(s))  CUP PACEART REMOTE DEVICE CHECK     Status: None   Collection Time: 10/11/19  6:00 AM  Result Value Ref Range   Date Time Interrogation Session [id number]    Pulse Generator Manufacturer [employer]    Pulse Gen Model 2272 Assurity MRI    Pulse Gen Serial Number [id number]    Clinic Name [facility]    Implantable Pulse Generator Type Implantable Pulse Generator    Implantable Pulse Generator Implant Date 06301601    Implantable Lead Manufacturer [place] IsoFlex Optim    Implantable Lead Serial Number [id number]    Implantable Lead Implant Date 09323557    Implantable Lead Location [id number]    Implantable Lead Manufacturer [facility]    Implantable Lead Model [id number] Tendril STS    Implantable Lead Serial Number [id number]    Implantable Lead Implant Date 42706237    Implantable Lead Location [id number]    Lead Channel Setting Sensing Sensitivity 4.0 mV   Lead Channel Setting Sensing Adaptation Mode Fixed Pacing    Lead Channel Setting Pacing Amplitude 2.0 V   Lead Channel Setting Pacing Pulse Width 0.5 ms   Lead Channel Setting Pacing Amplitude 1.125    Lead Channel Status NULL    Lead Channel Impedance Value 350 ohm   Lead Channel Sensing Intrinsic Amplitude 2.5 mV   Lead Channel Pacing Threshold Amplitude 0.75 V   Lead Channel Pacing Threshold Pulse Width 0.5 ms   Lead Channel Status NULL    Lead Channel Impedance Value 490 ohm   Lead Channel Sensing Intrinsic Amplitude 12.0 mV   Lead Channel Pacing Threshold Amplitude 0.875 V   Lead Channel Pacing Threshold Pulse Width 0.5 ms   Battery Status MOS    Battery Remaining Longevity 118 mo   Battery Remaining Percentage 95.5 %   Battery Voltage 3.02 V   Brady Statistic RA Percent Paced 1.0 %   Brady Statistic RV Percent Paced 99.0 %    Brady Statistic AP VP Percent 1.0 %   Brady Statistic AS VP Percent 99.0 %   Brady Statistic AP VS Percent 1.0 %   Brady Statistic AS VS Percent 1.0 %    Radiology No results found.  Assessment/Plan  No problem-specific Assessment & Plan notes found for this encounter.    Leotis Pain, MD  12/10/2019 8:21 AM    This note was created with Dragon medical transcription system.  Any errors from dictation are purely unintentional

## 2019-12-15 ENCOUNTER — Telehealth (INDEPENDENT_AMBULATORY_CARE_PROVIDER_SITE_OTHER): Payer: Self-pay

## 2019-12-16 ENCOUNTER — Ambulatory Visit: Payer: Medicare Other | Attending: Internal Medicine

## 2019-12-16 DIAGNOSIS — Z20822 Contact with and (suspected) exposure to covid-19: Secondary | ICD-10-CM

## 2019-12-17 LAB — NOVEL CORONAVIRUS, NAA: SARS-CoV-2, NAA: NOT DETECTED

## 2019-12-20 NOTE — Telephone Encounter (Signed)
Complete

## 2019-12-27 ENCOUNTER — Telehealth: Payer: Self-pay | Admitting: Internal Medicine

## 2019-12-27 NOTE — Telephone Encounter (Signed)
Pt c/o medication issue:  1. Name of Medication: K+   2. How are you currently taking this medication (dosage and times per day)? Please advise s/p lab results   3. Are you having a reaction (difficulty breathing--STAT)? no  4. What is your medication issue? Please advise which dose to take based on lab results

## 2019-12-28 ENCOUNTER — Other Ambulatory Visit: Payer: Self-pay | Admitting: Cardiovascular Disease

## 2019-12-28 MED ORDER — SPIRONOLACTONE 25 MG PO TABS: 25.0000 mg | ORAL_TABLET | Freq: Every day | ORAL | 3 refills | Status: DC

## 2019-12-28 NOTE — Telephone Encounter (Signed)
Patient spouse calling to check on status

## 2019-12-28 NOTE — Telephone Encounter (Signed)
I called and spoke with the patient's wife (ok per DPR).  She was wanting to know if he needed to stay on spironolactone 25 mg once daily or increase back up to the 50 mg spironolactone once daily.  The patient was seen on 12/11 and Dr. Caryl Comes decreased his spironolactone dose down at that time due to an elevated K+ at 5.3. He had a repeat BMP on 12/18 and his K+ was 4.6.  I have advised the patient's wife that the patient should continue spironolactone 25 mg once daily. She would like this RX updated at [facility].

## 2019-12-29 ENCOUNTER — Other Ambulatory Visit: Payer: Self-pay

## 2019-12-29 ENCOUNTER — Telehealth: Payer: Self-pay

## 2019-12-29 MED ORDER — METOPROLOL SUCCINATE ER 25 MG PO TB24: 25.0000 mg | ORAL_TABLET | Freq: Every day | ORAL | 3 refills | Status: DC

## 2019-12-29 MED ORDER — SACUBITRIL-VALSARTAN 24-26 MG PO TABS: 1.0000 | ORAL_TABLET | Freq: Two times a day (BID) | ORAL | 3 refills | Status: DC

## 2019-12-29 NOTE — Telephone Encounter (Signed)
Medications sent to [facility].

## 2019-12-29 NOTE — Telephone Encounter (Signed)
*  STAT* If patient is at the pharmacy, call can be transferred to refill team.   1. Which medications need to be refilled? (please list name of each medication and dose if known) Entresto 24-26 MG  Metoprolol succinate 25 MG   2. Which pharmacy/location (including street and city if local pharmacy) is medication to be sent to? [facility]  3. Do they need a 30 day or 90 day supply? 90 day

## 2020-01-07 ENCOUNTER — Ambulatory Visit (INDEPENDENT_AMBULATORY_CARE_PROVIDER_SITE_OTHER): Payer: Medicare Other | Admitting: *Deleted

## 2020-01-07 DIAGNOSIS — I442 Atrioventricular block, complete: Secondary | ICD-10-CM | POA: Diagnosis not present

## 2020-01-07 LAB — CUP PACEART REMOTE DEVICE CHECK
Battery Remaining Longevity: 113 mo
Battery Remaining Percentage: 95.5 %
Battery Voltage: 3.01 V
Brady Statistic AP VP Percent: 1 %
Brady Statistic AP VS Percent: 1 %
Brady Statistic AS VP Percent: 99 %
Brady Statistic AS VS Percent: 1 %
Brady Statistic RA Percent Paced: 1 %
Brady Statistic RV Percent Paced: 99 %
Date Time Interrogation Session: 20210115020014
Implantable Lead Implant Date: 20111208
Implantable Lead Implant Date: 20111208
Implantable Lead Location: 753859
Implantable Lead Location: 753860
Implantable Lead Model: 1948
Implantable Pulse Generator Implant Date: 20200717
Lead Channel Impedance Value: 340 Ohm
Lead Channel Impedance Value: 460 Ohm
Lead Channel Pacing Threshold Amplitude: 0.5 V
Lead Channel Pacing Threshold Amplitude: 1.125 V
Lead Channel Pacing Threshold Pulse Width: 0.5 ms
Lead Channel Pacing Threshold Pulse Width: 0.5 ms
Lead Channel Sensing Intrinsic Amplitude: 12 mV
Lead Channel Sensing Intrinsic Amplitude: 2.1 mV
Lead Channel Setting Pacing Amplitude: 1.375
Lead Channel Setting Pacing Amplitude: 2 V
Lead Channel Setting Pacing Pulse Width: 0.5 ms
Lead Channel Setting Sensing Sensitivity: 4 mV
Pulse Gen Model: 2272
Pulse Gen Serial Number: 9150507

## 2020-01-07 NOTE — Progress Notes (Signed)
PPM remote 

## 2020-01-25 DIAGNOSIS — D485 Neoplasm of uncertain behavior of skin: Secondary | ICD-10-CM | POA: Diagnosis not present

## 2020-01-25 DIAGNOSIS — H6123 Impacted cerumen, bilateral: Secondary | ICD-10-CM | POA: Diagnosis not present

## 2020-01-25 DIAGNOSIS — X32XXXA Exposure to sunlight, initial encounter: Secondary | ICD-10-CM | POA: Diagnosis not present

## 2020-01-25 DIAGNOSIS — C44712 Basal cell carcinoma of skin of right lower limb, including hip: Secondary | ICD-10-CM | POA: Diagnosis not present

## 2020-01-25 DIAGNOSIS — L821 Other seborrheic keratosis: Secondary | ICD-10-CM | POA: Diagnosis not present

## 2020-01-25 DIAGNOSIS — C44622 Squamous cell carcinoma of skin of right upper limb, including shoulder: Secondary | ICD-10-CM | POA: Diagnosis not present

## 2020-01-25 DIAGNOSIS — D2271 Melanocytic nevi of right lower limb, including hip: Secondary | ICD-10-CM | POA: Diagnosis not present

## 2020-01-25 DIAGNOSIS — D225 Melanocytic nevi of trunk: Secondary | ICD-10-CM | POA: Diagnosis not present

## 2020-01-25 DIAGNOSIS — L570 Actinic keratosis: Secondary | ICD-10-CM | POA: Diagnosis not present

## 2020-01-25 DIAGNOSIS — H6063 Unspecified chronic otitis externa, bilateral: Secondary | ICD-10-CM | POA: Diagnosis not present

## 2020-01-25 DIAGNOSIS — D0421 Carcinoma in situ of skin of right ear and external auricular canal: Secondary | ICD-10-CM | POA: Diagnosis not present

## 2020-01-25 DIAGNOSIS — J330 Polyp of nasal cavity: Secondary | ICD-10-CM | POA: Diagnosis not present

## 2020-01-25 DIAGNOSIS — D2261 Melanocytic nevi of right upper limb, including shoulder: Secondary | ICD-10-CM | POA: Diagnosis not present

## 2020-01-25 DIAGNOSIS — D045 Carcinoma in situ of skin of trunk: Secondary | ICD-10-CM | POA: Diagnosis not present

## 2020-01-25 DIAGNOSIS — Z85828 Personal history of other malignant neoplasm of skin: Secondary | ICD-10-CM | POA: Diagnosis not present

## 2020-01-25 DIAGNOSIS — C44519 Basal cell carcinoma of skin of other part of trunk: Secondary | ICD-10-CM | POA: Diagnosis not present

## 2020-01-25 DIAGNOSIS — D2262 Melanocytic nevi of left upper limb, including shoulder: Secondary | ICD-10-CM | POA: Diagnosis not present

## 2020-01-25 DIAGNOSIS — H903 Sensorineural hearing loss, bilateral: Secondary | ICD-10-CM | POA: Diagnosis not present

## 2020-01-29 ENCOUNTER — Other Ambulatory Visit: Payer: Self-pay | Admitting: Family Medicine

## 2020-01-29 ENCOUNTER — Other Ambulatory Visit: Payer: Self-pay | Admitting: Cardiovascular Disease

## 2020-01-29 DIAGNOSIS — K2100 Gastro-esophageal reflux disease with esophagitis, without bleeding: Secondary | ICD-10-CM

## 2020-01-29 NOTE — Telephone Encounter (Signed)
Requested Prescriptions  Pending Prescriptions Disp Refills  . omeprazole (PRILOSEC) 40 MG capsule [Pharmacy Med Name: OMEPRAZOL RX CAP 40MG ] 180 capsule 3    Sig: TAKE 1 CAPSULE TWICE DAILY AS NEEDED     Gastroenterology: Proton Pump Inhibitors Passed - 01/29/2020  6:19 PM      Passed - Valid encounter within last 12 months    Recent Outpatient Visits          1 month ago Type 2 diabetes mellitus without complication, without long-term current use of insulin ([place])   [place], [place], [place]   4 months ago Atherosclerosis of native artery of left lower extremity with intermittent claudication ([place])   [facility], [place], [place]   5 months ago Claudication [facility])   [facility] [place], [place], PA-C   1 year ago Type 2 diabetes mellitus without complication, without long-term current use of insulin [facility])   [place], [place], [place]   1 year ago Type 2 diabetes mellitus without complication, without long-term current use of insulin [facility])   [place], [place], [place]

## 2020-01-31 NOTE — Telephone Encounter (Signed)
Please call to schedule overdue F/U with Dr. [name]. Thank you!

## 2020-01-31 NOTE — Progress Notes (Signed)
Cardiology Office Note  Date:  02/01/2020   ID:  Howard [name], DOB [birth year], MRN [id number]  PCP:  Margo Common, PA   Chief Complaint  Patient presents with  . Routine follow up    HPI:  Mr. Selvidge is a 86 year old-year-old gentleman with a hx of  DM II complete heart block, status post pacemaker in 2011,  nonischemic cardiomyopathy,  ejection fraction 30-35%   coronary artery disease, 70% distal RCA disease, 30 and 40% lesions in his ramus, LAD  admission to the hospital 09/05/2014  with acute cholecystitis, ileus.  Cardiac catheterization at that time for positive stress test . Lung CA  He presents for follow-up of his CAD  hospital August 30, 2019 with abdominal pain, CT scan confirming splenic infarct, right renal artery malperfusion TEE with no evidence of left atrial thrombus or left atrial appendage thrombus Severe aortic atherosclerosis,  New pacer 06/2019 Pacer downloads reviewed: Stable  Sept 2020, vascular records reviewed with him in detail AAA, right renal artery stenosis, severe aortoiliac occlusive disease with left iliac occlusion and right iliac stenosis, left SFA stenosis, rest pain and pregangrenous changes to the left great toe 1. Left common femoral artery and profunda femoris artery endarterectomy with core matrix patch angioplasty 2. Placement of a 6 mm diameter by 15 cm length via bond stent left superficial femoral artery postdilated with a 5 mm diameter Lutonix drug-coated angioplasty balloon 3 Right renal artery stent placement with 7 mm diameter by 16 mm length lifestream stent---Placement of a 23 mm diameter 14 cm length Gore Excluder Endoprosthesis main body right with a 14 mm diameter by 12 cm length left contralateral limb ---Stent placement in the right external iliac artery with 10 mm diameter by 58 mm length lifestream stent for occlusive disease --Gore excluder limb extension with a 12 mm diameter by 14 cm length limb to the distal  right external iliac artery for occlusive disease --Viabahn stent placement to the left external iliac artery with 13 mm diameter by 10 cm length stent for occlusive disease ---Fogarty embolectomy left SFA  TEE 08/2019 reviewed in detail . The left ventricle has low normal systolic function, with an ejection  fraction of 50-55%. The cavity size was normal. There is mildly increased  left ventricular wall thickness. Left ventricular diastolic Doppler  parameters are consistent with  indeterminate diastolic dysfunction. Indeterminate filling pressures No  evidence of left ventricular regional wall motion abnormalities.  2. The right ventricle has normal systolc function. There is no increase  in right ventricular wall thickness.  3. Left atrial size was moderately dilated.  4. No evidence of a thrombus present in the left atrium or left atrial  appendage.  5. Evidence of atrial level shunting detected by color flow Doppler, PFO  present, saline contrast bubble study positive for PFO, small number of  bubbles crossing at rest, unable to test with valsalva  6. Moderate plaque in the descending aorta and aortic arch.   Lab work reviewed HBA1C 7.5 Total chol 136  No regular exercise program, sedentary Limited by history of back pain, back surgery x6  EKG personally reviewed by myself on todays visit shows paced rhythm 68 bpm  Other past medical history reviewed hospital November 2016 for abscess on his back, had surgery Nodules found in the right lung, biopsy documenting adenocarcinoma 1.3 cm, treated with radiation  Previous CT chest 03/2016 Decreasing size of pathology-proven adenocarcinoma of right upper lobe, with associated radiation treatment effects of the lung.  Previous chest CT scan showing moderate coronary calcifications in the LAD and diagonal branches, ramus Also with at least mild diffuse aortic atherosclerosis, mild carotid disease   PMH:   has a past medical  history of Arthritis, Bell palsy, Cancer ([place]), Chronic combined systolic and diastolic CHF, NYHA class 1 ([place]), Complete heart block ([place]), Depression, Diabetes mellitus without complication ([place]), Fall (11-10-14), GERD (gastroesophageal reflux disease), History of prostate cancer, Hyperlipidemia, Hypertension, LBBB (left bundle branch block), Left-sided Bell's palsy, Lung cancer ([place]) (2016), NICM (nonischemic cardiomyopathy) ([place]), Non-obstructive CAD, Poor balance, Presence of permanent cardiac pacemaker, Sleep apnea, Vertigo, and WPW (Wolff-Parkinson-White syndrome).  PSH:    Past Surgical History:  Procedure Laterality Date  . ABDOMINAL AORTIC ENDOVASCULAR STENT GRAFT  08/25/2019   Procedure: ABDOMINAL AORTIC ENDOVASCULAR STENT GRAFT;  Surgeon: Algernon Huxley, MD;  Location: [facility] ORS;  Service: Vascular;;  . ANGIOPLASTY Left 08/25/2019   Procedure: ANGIOPLASTY;  Surgeon: Algernon Huxley, MD;  Location: [facility];  Service: Vascular;  Laterality: Left;  left SFA and stent placement  . APPLICATION OF WOUND VAC Left 06/07/2015   Procedure: APPLICATION OF WOUND VAC;  Surgeon: Robert Bellow, MD;  Location: [facility] OR[facility];  Service: General;  Laterality: Left;  left upper back  . BACK SURGERY     2011  . CARDIAC CATHETERIZATION  08/26/2014   Single vessel obstructive CAD  . CARPAL TUNNEL RELEASE  04-04-15   [facility]  . CATARACT EXTRACTION  07-31-11 and 09-18-11  . Catheter ablation  1991   for WPW  . cervical fusion    . CHOLECYSTECTOMY  09-07-14  . ENDARTERECTOMY FEMORAL Left 08/25/2019   Procedure: ENDARTERECTOMY FEMORAL;  Surgeon: Algernon Huxley, MD;  Location: [facility];  Service: Vascular;  Laterality: Left;  common and produndis   . ENDOVASCULAR REPAIR/STENT GRAFT Right 08/25/2019   Procedure: ENDOVASCULAR REPAIR/STENT GRAFT;  Surgeon: Algernon Huxley, MD;  Location: [facility];  Service: Vascular;  Laterality: Right;  renal artery  . HAND SURGERY     right 1993; left 2005  . HERNIA REPAIR  1955  . INSERT / REPLACE /  REMOVE PACEMAKER    . INSERTION OF ILIAC STENT Bilateral 08/25/2019   Procedure: INSERTION OF ILIAC STENT;  Surgeon: Algernon Huxley, MD;  Location: [facility];  Service: Vascular;  Laterality: Bilateral;  . JOINT REPLACEMENT Left 2013   knee  . JOINT REPLACEMENT Right 2004   knee  . KNEE SURGERY     left knee 1991 and 1992; right knee 1995  . LEFT HEART CATHETERIZATION WITH CORONARY ANGIOGRAM N/A 08/26/2014   Procedure: LEFT HEART CATHETERIZATION WITH CORONARY ANGIOGRAM;  Surgeon: [name], MD;  [facility];  Service: Cardiovascular;  Laterality: N/A;  . LOWER EXTREMITY ANGIOGRAPHY Left 08/23/2019   Procedure: Lower Extremity Angiography;  Surgeon: Algernon Huxley, MD;  Location: [place] CV LAB;  Service: Cardiovascular;  Laterality: Left;  . LUMBAR LAMINECTOMY/DECOMPRESSION MICRODISCECTOMY N/A 06/07/2014   Procedure: LUMBAR FOUR TO FIVE LUMBAR LAMINECTOMY/DECOMPRESSION MICRODISCECTOMY 1 LEVEL;  Surgeon: [name], MD;  Location: [place] NEURO ORS;  Service: Neurosurgery;  Laterality: N/A;  . LUNG BIOPSY Right 2016   Dr [name]  . PACEMAKER INSERTION     PPM-- [employer] 11/30/10 by [name]  . PPM GENERATOR CHANGEOUT N/A 07/09/2019   Procedure: PPM GENERATOR CHANGEOUT;  Surgeon: Deboraha Sprang, MD;  Location: [place] CV LAB;  Service: Cardiovascular;  Laterality: N/A;  . PROSTATE SURGERY     cancer--1998,  prostatectomy  . REPLACEMENT TOTAL KNEE     2004  . ruptured disc     1962 and 1998  . TEE WITHOUT CARDIOVERSION N/A 09/01/2019   Procedure: TRANSESOPHAGEAL ECHOCARDIOGRAM (TEE);  Surgeon: [name], MD;  Location: [facility] OR[facility];  Service: Cardiovascular;  Laterality: N/A;  . TEMPORARY PACEMAKER N/A 07/09/2019   Procedure: TEMPORARY PACEMAKER;  Surgeon: Deboraha Sprang, MD;  Location: [place] CV LAB;  Service: Cardiovascular;  Laterality: N/A;  . TRIGGER FINGER RELEASE  01-24-15  . WOUND DEBRIDEMENT Left 06/07/2015   Procedure: DEBRIDEMENT WOUND;  Surgeon: Robert Bellow, MD;   Location: [facility] ORS;  Service: General;  Laterality: Left;  left upper back    Current Outpatient Medications  Medication Sig Dispense Refill  . apixaban (ELIQUIS) 5 MG TABS tablet Take 1 tablet (5 mg total) by mouth 2 (two) times daily. 60 tablet 60  . aspirin EC 81 MG tablet Take 81 mg by mouth daily.    . Cholecalciferol (VITAMIN D3) 1000 UNITS CAPS Take 1,000 Units by mouth daily.     . fluticasone (FLONASE) 50 MCG/ACT nasal spray Place 2 sprays into both nostrils daily.    . isosorbide mononitrate (IMDUR) 30 MG 24 hr tablet Take 1 tablet (30 mg total) by mouth daily. 90 tablet 2  . metFORMIN (GLUCOPHAGE) 500 MG tablet Take 1 tablet (500 mg total) by mouth 2 (two) times daily with a meal. 180 tablet 3  . metoprolol succinate (TOPROL-XL) 25 MG 24 hr tablet Take 1 tablet (25 mg total) by mouth at bedtime. 90 tablet 3  . montelukast (SINGULAIR) 10 MG tablet Take 10 mg by mouth daily as needed (sneezing/allergies.).     Marland Kitchen Multiple Vitamin (MULTIVITAMIN WITH MINERALS) TABS tablet Take 1 tablet by mouth daily. One-A-Day Multivitamin    . omeprazole (PRILOSEC) 40 MG capsule TAKE 1 CAPSULE TWICE DAILY AS NEEDED 180 capsule 3  . sacubitril-valsartan (ENTRESTO) 24-26 MG Take 1 tablet by mouth 2 (two) times daily. 90 tablet 3  . simvastatin (ZOCOR) 40 MG tablet TAKE 1 TABLET AT BEDTIME 90 tablet 0  . spironolactone (ALDACTONE) 25 MG tablet Take 1 tablet (25 mg total) by mouth daily. 90 tablet 3   No current facility-administered medications for this visit.     Allergies:   Sulfa antibiotics   Social History:  The patient  reports that he has quit smoking. He quit after 4.00 years of use. He has never used smokeless tobacco. He reports that he does not drink alcohol or use drugs.   Family History:   family history includes CAD in an other family member; Heart attack in his mother; Hyperlipidemia in his mother.    Review of Systems: Review of Systems  Constitutional: Negative.   Respiratory:  Negative.   Cardiovascular: Negative.   Gastrointestinal: Negative.   Musculoskeletal: Positive for back pain and joint pain.  Neurological: Negative.   Psychiatric/Behavioral: Negative.   All other systems reviewed and are negative.    PHYSICAL EXAM: VS:  BP 132/62   Pulse 68   Ht 5\' 9"  (1.753 m)   Wt 190 lb 12 oz (86.5 kg)   BMI 28.17 kg/m  , BMI Body mass index is 28.17 kg/m. Constitutional:  oriented to person, place, and time. No distress.  HENT:  Head: Grossly normal Eyes:  no discharge. No scleral icterus.  Neck: No JVD, no carotid bruits  Cardiovascular: Regular rate and rhythm, no murmurs appreciated Pulmonary/Chest: Clear to auscultation bilaterally, no wheezes or  rails Abdominal: Soft.  no distension.  no tenderness.  Musculoskeletal: Normal range of motion Neurological:  normal muscle tone. Coordination normal. No atrophy Skin: Skin warm and dry Psychiatric: normal affect, pleasant   Recent Labs: 09/02/2019: Magnesium 2.0 09/07/2019: ALT 19 12/10/2019: BUN 18; Creatinine, Ser 1.09; Hemoglobin 12.8; Platelets 272; Potassium 4.6; Sodium 137    Lipid Panel Lab Results  Component Value Date   CHOL 136 12/10/2019   HDL 42 12/10/2019   LDLCALC 75 12/10/2019   TRIG 93 12/10/2019      Wt Readings from Last 3 Encounters:  02/01/20 190 lb 12 oz (86.5 kg)  12/10/19 183 lb (83 kg)  12/06/19 186 lb (84.4 kg)      ASSESSMENT AND PLAN:  Coronary artery disease involving native coronary artery of native heart without angina pectoris -  Currently with no symptoms of angina. No further workup at this time. Continue current medication regimen.  Complete heart block (HCC) - Plan: EKG 12-Lead S/p pacemaker Ejection fraction 50 to 55% on TEE  Essential hypertension - Plan: EKG 12-Lead Blood pressure is well controlled on today's visit. No changes made to the medications.  Mixed hyperlipidemia  On simvastatin add Zetia 10 mg daily  Nonischemic cardiomyopathy  (HCC) Ef 50% No medication changes made  Chronic back pain Long discussion with him, we have ordered physical therapy locally He is having difficulty walking  Pacemaker-St[employer]Jude Followed by EP  Malignant neoplasm of middle lobe of right lung [facility]) Previous CT scan March 2017  Stable   Total encounter time more than 25 minutes  Greater than 50% was spent in counseling and coordination of care with the patient   Disposition:   F/U  12 months   Orders Placed This Encounter  Procedures  . EKG 12-Lead     Signed, Esmond Plants, M.D., Ph.D. 02/01/2020  [facility] [place], [place] [phone]

## 2020-02-01 ENCOUNTER — Encounter: Payer: Self-pay | Admitting: Cardiovascular Disease

## 2020-02-01 ENCOUNTER — Ambulatory Visit (INDEPENDENT_AMBULATORY_CARE_PROVIDER_SITE_OTHER): Payer: Medicare Other | Admitting: Cardiovascular Disease

## 2020-02-01 ENCOUNTER — Other Ambulatory Visit: Payer: Self-pay

## 2020-02-01 VITALS — BP 132/62 | HR 68 | Ht 69.0 in | Wt 190.8 lb

## 2020-02-01 DIAGNOSIS — I10 Essential (primary) hypertension: Secondary | ICD-10-CM | POA: Diagnosis not present

## 2020-02-01 DIAGNOSIS — I442 Atrioventricular block, complete: Secondary | ICD-10-CM | POA: Diagnosis not present

## 2020-02-01 DIAGNOSIS — I509 Heart failure, unspecified: Secondary | ICD-10-CM | POA: Diagnosis not present

## 2020-02-01 DIAGNOSIS — I428 Other cardiomyopathies: Secondary | ICD-10-CM | POA: Diagnosis not present

## 2020-02-01 MED ORDER — EZETIMIBE 10 MG PO TABS: 10.0000 mg | ORAL_TABLET | Freq: Every day | ORAL | 3 refills | Status: DC

## 2020-02-01 NOTE — Patient Instructions (Signed)
Medication Instructions:  Please start zetia 10 mg daily for cholesterol  If you need a refill on your cardiac medications before your next appointment, please call your pharmacy.    Lab work: No new labs needed   If you have labs (blood work) drawn today and your tests are completely normal, you will receive your results only by: Marland Kitchen MyChart Message (if you have MyChart) OR . A paper copy in the mail If you have any lab test that is abnormal or we need to change your treatment, we will call you to review the results.   Testing/Procedures: No new testing needed   Follow-Up: At [facility], you and your health needs are our priority.  As part of our continuing mission to provide you with exceptional heart care, we have created designated Provider Care Teams.  These Care Teams include your primary Cardiologist (physician) and Advanced Practice Providers (APPs -  Physician Assistants and Nurse Practitioners) who all work together to provide you with the care you need, when you need it.  . You will need a follow up appointment in 12 months   . Providers on your designated Care Team:   . Murray Hodgkins, NP . Christell Faith, PA-C . Marrianne Mood, PA-C  Any Other Special Instructions Will Be Listed Below (If Applicable).  For educational health videos Log in to : [email or web address] Or : [email or web address], password : [email or web address]

## 2020-02-03 ENCOUNTER — Telehealth: Payer: Self-pay | Admitting: Cardiovascular Disease

## 2020-02-03 NOTE — Telephone Encounter (Signed)
Spoke with patients wife per release form. Reviewed that he can take the zetia in the AM or PM. Discussed that most take in the morning and then take the statin in the PM. She verbalized understanding with no further questions at this time.

## 2020-02-03 NOTE — Telephone Encounter (Signed)
Patients wife calling in regarding Zetia.  Patient would like instruction whether to take medication in the am or pm  Please advise

## 2020-02-16 DIAGNOSIS — C44622 Squamous cell carcinoma of skin of right upper limb, including shoulder: Secondary | ICD-10-CM | POA: Diagnosis not present

## 2020-02-16 DIAGNOSIS — D0461 Carcinoma in situ of skin of right upper limb, including shoulder: Secondary | ICD-10-CM | POA: Diagnosis not present

## 2020-03-01 DIAGNOSIS — C44519 Basal cell carcinoma of skin of other part of trunk: Secondary | ICD-10-CM | POA: Diagnosis not present

## 2020-03-01 DIAGNOSIS — D045 Carcinoma in situ of skin of trunk: Secondary | ICD-10-CM | POA: Diagnosis not present

## 2020-03-01 DIAGNOSIS — D0421 Carcinoma in situ of skin of right ear and external auricular canal: Secondary | ICD-10-CM | POA: Diagnosis not present

## 2020-03-01 DIAGNOSIS — C44712 Basal cell carcinoma of skin of right lower limb, including hip: Secondary | ICD-10-CM | POA: Diagnosis not present

## 2020-03-23 ENCOUNTER — Telehealth: Payer: Self-pay

## 2020-03-23 NOTE — Telephone Encounter (Signed)
Patients wife called and stated last night about midnight a green light came on the transmission box. States light started blinking and is still blinking. Advised to call [employer] tech support at [phone]. Advised [name] to call back if they have any questions or concerns. Direct number given.

## 2020-04-07 ENCOUNTER — Ambulatory Visit (INDEPENDENT_AMBULATORY_CARE_PROVIDER_SITE_OTHER): Payer: Medicare Other | Admitting: *Deleted

## 2020-04-07 DIAGNOSIS — I442 Atrioventricular block, complete: Secondary | ICD-10-CM

## 2020-04-07 LAB — CUP PACEART REMOTE DEVICE CHECK
Battery Remaining Longevity: 109 mo
Battery Remaining Percentage: 95.5 %
Battery Voltage: 3.01 V
Brady Statistic AP VP Percent: 1.2 %
Brady Statistic AP VS Percent: 1 %
Brady Statistic AS VP Percent: 98 %
Brady Statistic AS VS Percent: 1 %
Brady Statistic RA Percent Paced: 1 %
Brady Statistic RV Percent Paced: 99 %
Date Time Interrogation Session: 20210416020013
Implantable Lead Implant Date: 20111208
Implantable Lead Implant Date: 20111208
Implantable Lead Location: 753859
Implantable Lead Location: 753860
Implantable Lead Model: 1948
Implantable Pulse Generator Implant Date: 20200717
Lead Channel Impedance Value: 340 Ohm
Lead Channel Impedance Value: 440 Ohm
Lead Channel Pacing Threshold Amplitude: 0.5 V
Lead Channel Pacing Threshold Amplitude: 1.125 V
Lead Channel Pacing Threshold Pulse Width: 0.5 ms
Lead Channel Pacing Threshold Pulse Width: 0.5 ms
Lead Channel Sensing Intrinsic Amplitude: 10.4 mV
Lead Channel Sensing Intrinsic Amplitude: 3.3 mV
Lead Channel Setting Pacing Amplitude: 1.375
Lead Channel Setting Pacing Amplitude: 2 V
Lead Channel Setting Pacing Pulse Width: 0.5 ms
Lead Channel Setting Sensing Sensitivity: 4 mV
Pulse Gen Model: 2272
Pulse Gen Serial Number: 9150507

## 2020-04-07 NOTE — Progress Notes (Signed)
PPM Remote  

## 2020-04-19 ENCOUNTER — Ambulatory Visit (INDEPENDENT_AMBULATORY_CARE_PROVIDER_SITE_OTHER): Payer: Medicare Other | Admitting: Family

## 2020-04-19 ENCOUNTER — Other Ambulatory Visit: Payer: Self-pay

## 2020-04-19 ENCOUNTER — Encounter: Payer: Self-pay | Admitting: Family

## 2020-04-19 VITALS — BP 132/60 | HR 70 | Ht 69.5 in | Wt 188.5 lb

## 2020-04-19 DIAGNOSIS — Z950 Presence of cardiac pacemaker: Secondary | ICD-10-CM

## 2020-04-19 DIAGNOSIS — I872 Venous insufficiency (chronic) (peripheral): Secondary | ICD-10-CM | POA: Diagnosis not present

## 2020-04-19 DIAGNOSIS — I428 Other cardiomyopathies: Secondary | ICD-10-CM | POA: Diagnosis not present

## 2020-04-19 DIAGNOSIS — R600 Localized edema: Secondary | ICD-10-CM | POA: Diagnosis not present

## 2020-04-19 NOTE — Patient Instructions (Addendum)
Medication Instructions:  No medication changes today.   You may take Acetaminophen (Tylenol) 2 tablets as needed twice daily for back pain. You may take regular strength (325mg ) or extra strength (500 mg).  *If you need a refill on your cardiac medications before your next appointment, please call your pharmacy*   Lab Work: None ordered today.   Testing/Procedures: Your EKG today shows your pacemaker is functioning appropriately.   Follow-Up: At [facility], you and your health needs are our priority.  As part of our continuing mission to provide you with exceptional heart care, we have created designated Provider Care Teams.  These Care Teams include your primary Cardiologist (physician) and Advanced Practice Providers (APPs -  Physician Assistants and Nurse Practitioners) who all work together to provide you with the care you need, when you need it.  We recommend signing up for the patient portal called "MyChart".  Sign up information is provided on this After Visit Summary.  MyChart is used to connect with patients for Virtual Visits (Telemedicine).  Patients are able to view lab/test results, encounter notes, upcoming appointments, etc.  Non-urgent messages can be sent to your provider as well.   To learn more about what you can do with MyChart, go to [email or web address].    Your next appointment:   10 month(s)  The format for your next appointment:   In Person  Provider:    You may see [name], MD or one of the following Advanced Practice Providers on your designated Care Team:    Murray Hodgkins, NP  Christell Faith, PA-C  Marrianne Mood, PA-C  Other Instructions  Your feet swelling is likely venous insufficiency.    Recommend elevating your lower extremities when sitting.   Recommend continuing a low salt diet.   Recommend wearing compression stockings. You can also wear what is called 'compression stockings'.   We will send your primary care  provider a message about physical therapy.   If you do not notice improvement over the next week to week and a half with making these changes, would recommend calling Dr. Bunnie Domino office.    Chronic Venous Insufficiency Chronic venous insufficiency is a condition where the leg veins cannot effectively pump blood from the legs to the heart. This happens when the vein walls are either stretched, weakened, or damaged, or when the valves inside the vein are damaged. With the right treatment, you should be able to continue with an active life. This condition is also called venous stasis. What are the causes? Common causes of this condition include:  High blood pressure inside the veins (venous hypertension).  Sitting or standing too long, causing increased blood pressure in the leg veins.  A blood clot that blocks blood flow in a vein (deep vein thrombosis, DVT).  Inflammation of a vein (phlebitis) that causes a blood clot to form.  Tumors in the pelvis that cause blood to back up. What increases the risk? The following factors may make you more likely to develop this condition:  Having a family history of this condition.  Obesity.  Pregnancy.  Living without enough regular physical activity or exercise (sedentary lifestyle).  Smoking.  Having a job that requires long periods of standing or sitting in one place.  Being a certain age. Women in their 87s and 27s and men in their 7s are more likely to develop this condition. What are the signs or symptoms? Symptoms of this condition include:  Veins that are enlarged, bulging, or twisted (varicose  veins).  Skin breakdown or ulcers.  Reddened skin or dark discoloration of skin on the leg between the knee and ankle.  Brown, smooth, tight, and painful skin just above the ankle, usually on the inside of the leg (lipodermatosclerosis).  Swelling of the legs. How is this diagnosed? This condition may be diagnosed based on:  Your  medical history.  A physical exam.  Tests, such as: ? A procedure that creates an image of a blood vessel and nearby organs and provides information about blood flow through the blood vessel (duplex ultrasound). ? A procedure that tests blood flow (plethysmography). ? A procedure that looks at the veins using X-ray and dye (venogram). How is this treated? The goals of treatment are to help you return to an active life and to minimize pain or disability. Treatment depends on the severity of your condition, and it may include:  Wearing compression stockings. These can help relieve symptoms and help prevent your condition from getting worse. However, they do not cure the condition.  Sclerotherapy. This procedure involves an injection of a solution that shrinks damaged veins.  Surgery. This may involve: ? Removing a diseased vein (vein stripping). ? Cutting off blood flow through the vein (laser ablation surgery). ? Repairing or reconstructing a valve within the affected vein. Follow these instructions at home:      Wear compression stockings as told by your health care provider. These stockings help to prevent blood clots and reduce swelling in your legs.  Take over-the-counter and prescription medicines only as told by your health care provider.  Stay active by exercising, walking, or doing different activities. Ask your health care provider what activities are safe for you and how much exercise you need.  Drink enough fluid to keep your urine pale yellow.  Do not use any products that contain nicotine or tobacco, such as cigarettes, e-cigarettes, and chewing tobacco. If you need help quitting, ask your health care provider.  Keep all follow-up visits as told by your health care provider. This is important. Contact a health care provider if you:  Have redness, swelling, or more pain in the affected area.  See a red streak or line that goes up or down from the affected  area.  Have skin breakdown or skin loss in the affected area, even if the breakdown is small.  Get an injury in the affected area. Get help right away if:  You get an injury and an open wound in the affected area.  You have: ? Severe pain that does not get better with medicine. ? Sudden numbness or weakness in the foot or ankle below the affected area. ? Trouble moving your foot or ankle. ? A fever. ? Worse or persistent symptoms. ? Chest pain. ? Shortness of breath. Summary  Chronic venous insufficiency is a condition where the leg veins cannot effectively pump blood from the legs to the heart.  Chronic venous insufficiency occurs when the vein walls become stretched, weakened, or damaged, or when valves within the vein are damaged.  Treatment depends on how severe your condition is. It often involves wearing compression stockings and may involve having a procedure.  Make sure you stay active by exercising, walking, or doing different activities. Ask your health care provider what activities are safe for you and how much exercise you need. This information is not intended to replace advice given to you by your health care provider. Make sure you discuss any questions you have with your health care  provider. Document Revised: 09/01/2018 Document Reviewed: 09/01/2018 Elsevier Patient Education  [place].

## 2020-04-19 NOTE — Progress Notes (Signed)
Office Visit    Patient Name: Howard [name] Date of Encounter: 04/19/2020  Primary Care Provider:  Margo Common, PA Primary Cardiologist:  [name], MD Electrophysiologist:  [name], MD   Chief Complaint    Howard [name] is a 86 y.o. male with a hx of CHB s/p PPM 2011, and ICM, CAD, PAD, AAA, right renal artery stenosis, presents today for lower extremity edema.    Past Medical History    Past Medical History:  Diagnosis Date  . Arthritis   . Bell palsy   . Cancer [facility])    prostate and skin  . Chronic combined systolic and diastolic CHF, NYHA class 1 ([place])    a. 07/2014 Echo: EF 35-40%, Gr 1 DD.  Marland Kitchen Complete heart block ([place])    a. 11/2010 s/p SJM 2210 [employer] DC PPM, ser# [id number].  . Depression   . Diabetes mellitus without complication ([place])   . Fall 11-10-14  . GERD (gastroesophageal reflux disease)   . History of prostate cancer   . Hyperlipidemia   . Hypertension   . LBBB (left bundle branch block)   . Left-sided Bell's palsy   . Lung cancer ([place]) 2016  . NICM (nonischemic cardiomyopathy) ([place])    a. 07/2014 Echo: EF 35-40%, mid-apicalanteroseptal DK, Gr 1 DD, mild-mod dil LA.  . Non-obstructive CAD    a. 07/2014 Abnl MV;  b. 08/2014 Cath: LM nl, LAD 30p, RI 40p, LCX nl, OM1 40, RCA dominant 30p, 70d-->Med Rx.  Marland Kitchen Poor balance   . Presence of permanent cardiac pacemaker   . Sleep apnea    a. cpap  . Vertigo   . WPW (Wolff-Parkinson-White syndrome)    a. S/P RFCA 1991.   Past Surgical History:  Procedure Laterality Date  . ABDOMINAL AORTIC ENDOVASCULAR STENT GRAFT  08/25/2019   Procedure: ABDOMINAL AORTIC ENDOVASCULAR STENT GRAFT;  Surgeon: Algernon Huxley, MD;  Location: [facility] ORS;  Service: Vascular;;  . ANGIOPLASTY Left 08/25/2019   Procedure: ANGIOPLASTY;  Surgeon: Algernon Huxley, MD;  Location: [facility] ORS;  Service: Vascular;  Laterality: Left;  left SFA and stent placement  . APPLICATION OF WOUND VAC Left 06/07/2015   Procedure: APPLICATION OF WOUND  VAC;  Surgeon: Robert Bellow, MD;  Location: [facility] ORS;  Service: General;  Laterality: Left;  left upper back  . BACK SURGERY     2011  . CARDIAC CATHETERIZATION  08/26/2014   Single vessel obstructive CAD  . CARPAL TUNNEL RELEASE  04-04-15   [facility]  . CATARACT EXTRACTION  07-31-11 and 09-18-11  . Catheter ablation  1991   for WPW  . cervical fusion    . CHOLECYSTECTOMY  09-07-14  . ENDARTERECTOMY FEMORAL Left 08/25/2019   Procedure: ENDARTERECTOMY FEMORAL;  Surgeon: Algernon Huxley, MD;  Location: [facility] ORS;  Service: Vascular;  Laterality: Left;  common and produndis   . ENDOVASCULAR REPAIR/STENT GRAFT Right 08/25/2019   Procedure: ENDOVASCULAR REPAIR/STENT GRAFT;  Surgeon: Algernon Huxley, MD;  Location: [facility] OR[facility];  Service: Vascular;  Laterality: Right;  renal artery  . HAND SURGERY     right 1993; left 2005  . HERNIA REPAIR  1955  . INSERT / REPLACE / REMOVE PACEMAKER    . INSERTION OF ILIAC STENT Bilateral 08/25/2019   Procedure: INSERTION OF ILIAC STENT;  Surgeon: Algernon Huxley, MD;  Location: [facility] ORS;  Service: Vascular;  Laterality: Bilateral;  . JOINT REPLACEMENT Left 2013   knee  . JOINT REPLACEMENT  Right 2004   knee  . KNEE SURGERY     left knee 1991 and 1992; right knee 1995  . LEFT HEART CATHETERIZATION WITH CORONARY ANGIOGRAM N/A 08/26/2014   Procedure: LEFT HEART CATHETERIZATION WITH CORONARY ANGIOGRAM;  Surgeon: [name], MD;  Location: [facility] CATH LAB;  Service: Cardiovascular;  Laterality: N/A;  . LOWER EXTREMITY ANGIOGRAPHY Left 08/23/2019   Procedure: Lower Extremity Angiography;  Surgeon: Algernon Huxley, MD;  Location: [place] CV LAB;  Service: Cardiovascular;  Laterality: Left;  . LUMBAR LAMINECTOMY/DECOMPRESSION MICRODISCECTOMY N/A 06/07/2014   Procedure: LUMBAR FOUR TO FIVE LUMBAR LAMINECTOMY/DECOMPRESSION MICRODISCECTOMY 1 LEVEL;  Surgeon: [name], MD;  Location: [place] NEURO ORS;  Service: Neurosurgery;  Laterality: N/A;  . LUNG BIOPSY Right 2016   Dr [name]  . PACEMAKER  INSERTION     PPM-- St Jude 11/30/10 by [name]  . PPM GENERATOR CHANGEOUT N/A 07/09/2019   Procedure: PPM GENERATOR CHANGEOUT;  Surgeon: Deboraha Sprang, MD;  Location: [place] CV LAB;  Service: Cardiovascular;  Laterality: N/A;  . PROSTATE SURGERY     cancer--1998, prostatectomy  . REPLACEMENT TOTAL KNEE     2004  . ruptured disc     1962 and 1998  . TEE WITHOUT CARDIOVERSION N/A 09/01/2019   Procedure: TRANSESOPHAGEAL ECHOCARDIOGRAM (TEE);  Surgeon: [name], MD;  Location: [facility] ORS;  Service: Cardiovascular;  Laterality: N/A;  . TEMPORARY PACEMAKER N/A 07/09/2019   Procedure: TEMPORARY PACEMAKER;  Surgeon: Deboraha Sprang, MD;  Location: [place] CV LAB;  Service: Cardiovascular;  Laterality: N/A;  . TRIGGER FINGER RELEASE  01-24-15  . WOUND DEBRIDEMENT Left 06/07/2015   Procedure: DEBRIDEMENT WOUND;  Surgeon: Robert Bellow, MD;  Location: [facility] ORS;  Service: General;  Laterality: Left;  left upper back    Allergies  Allergies  Allergen Reactions  . Sulfa Antibiotics Rash    History of Present Illness    Howard [name] is a 86 y.o. male with a hx of CHB s/p PPM in 2011, NICM, CAD, lung cancer, PAD, AAA, renal artery stenosis s/p stent.  He was last seen 01/2020 by Dr. [name].  ACE PPM was initially placed on 2011 Repli[employer] block.  He received a new pacemaker 06/2019.  He follows with Dr. Caryl Comes of [facility]. He additionally follows with Dr. Lucky Cowboy of [facility].  His PAD is s/p stenting.  Echo 09/01/2019 with LVEF 50-55%, no significant valvular abnormalities.  Presents today with his wife.  Reports approximate 2-week history of worsening lower extremity edema particularly his feet.  Notes he is having trouble getting on his normal shoes.  His wife tells me the swelling occurs mainly at the end of the day.  He endorses sitting for long periods during the day and does not elevate his feet.  Does eat a low-sodium diet.  They were very worried about his PAD.  He reports no new numbness,  tingling, leg pain.  He has palpable pulses bilaterally and reassurance was provided.  We discussed that his swelling is likely venous insufficiency as recent echo within the last year showed no evidence of heart failure.  He does have marked broken blood vessels and varicose veins on bilateral lower extremities.  Of note they did inquire about physical therapy during the visit and and it was sent to their primary care provider to see if this would be a possibility.  He has participated in therapy before and very much enjoyed it.  EKGs/Labs/Other Studies Reviewed:   The following  studies were reviewed today:  EKG:  EKG is ordered today.  The ekg ordered today demonstrates atrial sensed v-paced rhythm with single PVC rate 70 bpm  Recent Labs: 09/02/2019: Magnesium 2.0 09/07/2019: ALT 19 12/10/2019: BUN 18; Creatinine, Ser 1.09; Hemoglobin 12.8; Platelets 272; Potassium 4.6; Sodium 137  Recent Lipid Panel    Component Value Date/Time   CHOL 136 12/10/2019 1044   CHOL 138 08/25/2018 1053   TRIG 93 12/10/2019 1044   HDL 42 12/10/2019 1044   HDL 46 08/25/2018 1053   CHOLHDL 3.2 12/10/2019 1044   VLDL 19 12/10/2019 1044   LDLCALC 75 12/10/2019 1044   LDLCALC 62 08/25/2018 1053   LDLCALC 63 09/30/2017 1003    Home Medications   Current Meds  Medication Sig  . apixaban (ELIQUIS) 5 MG TABS tablet Take 1 tablet (5 mg total) by mouth 2 (two) times daily.  Marland Kitchen aspirin EC 81 MG tablet Take 81 mg by mouth daily.  . Cholecalciferol (VITAMIN D3) 1000 UNITS CAPS Take 1,000 Units by mouth daily.   Marland Kitchen ezetimibe (ZETIA) 10 MG tablet Take 1 tablet (10 mg total) by mouth daily.  . fluticasone (FLONASE) 50 MCG/ACT nasal spray Place 2 sprays into both nostrils daily.  . isosorbide mononitrate (IMDUR) 30 MG 24 hr tablet Take 1 tablet (30 mg total) by mouth daily.  . metFORMIN (GLUCOPHAGE) 500 MG tablet Take 1 tablet (500 mg total) by mouth 2 (two) times daily with a meal.  . metoprolol succinate  (TOPROL-XL) 25 MG 24 hr tablet Take 1 tablet (25 mg total) by mouth at bedtime.  . montelukast (SINGULAIR) 10 MG tablet Take 10 mg by mouth daily as needed (sneezing/allergies.).   Marland Kitchen Multiple Vitamin (MULTIVITAMIN WITH MINERALS) TABS tablet Take 1 tablet by mouth daily. One-A-Day Multivitamin  . omeprazole (PRILOSEC) 40 MG capsule TAKE 1 CAPSULE TWICE DAILY AS NEEDED  . sacubitril-valsartan (ENTRESTO) 24-26 MG Take 1 tablet by mouth 2 (two) times daily.  . simvastatin (ZOCOR) 40 MG tablet TAKE 1 TABLET AT BEDTIME  . spironolactone (ALDACTONE) 25 MG tablet Take 1 tablet (25 mg total) by mouth daily.      Review of Systems      Review of Systems  Constitution: Negative for chills, fever and malaise/fatigue.  Cardiovascular: Positive for leg swelling. Negative for chest pain, dyspnea on exertion, near-syncope, orthopnea, palpitations and syncope.  Respiratory: Negative for cough, shortness of breath and wheezing.   Gastrointestinal: Negative for nausea and vomiting.  Neurological: Positive for weakness. Negative for dizziness and light-headedness.   All other systems reviewed and are otherwise negative except as noted above.  Physical Exam    VS:  BP 132/60 (BP Location: Left Arm, Patient Position: Sitting, Cuff Size: Normal)   Pulse 70   Ht 5' 9.5" (1.765 m)   Wt 188 lb 8 oz (85.5 kg)   SpO2 97%   BMI 27.44 kg/m  , BMI Body mass index is 27.44 kg/m. GEN: Well nourished, well developed, in no acute distress. HEENT: normal. Neck: Supple, no JVD, carotid bruits, or masses. Cardiac: RRR, no murmurs, rubs, or gallops. No clubbing, cyanosis, edema.   VVS: Radials 2+ and equal bilaterally.DP/PT palpable. Notable varicose veins to bilateral lower extremities.  Respiratory:  Respirations regular and unlabored, clear to auscultation bilaterally. GI: Soft, nontender, nondistended, BS + x 4. MS: No deformity or atrophy. Skin: Warm and dry, no rash. Neuro:  Strength and sensation are  intact. Psych: Normal affect.  Assessment & Plan  1. LE edema -likely etiology venous insufficiency.  Marked varicose veins on exam.  Palpable pedal pulses.  No edema noted on exam today.  Recommend elevating lower extremities when sitting, low-sodium diet, compression stockings. 2. CAD -EKG today no acute changes.  Stable with no anginal symptoms.  No indication for ischemic evaluation at this time. GDMT includes aspirin, beta blocker, statin.  3. CHB s/p PPM -follows with Dr. Caryl Comes of EP.  EKG today shows appropriately functioning PPM. 4. HLD -continue simvastatin and Zetia. 5. NICM -most recent echo with normal LVEF.  He is euvolemic and well compensated on exam.  Continue present medications including beta blocker, Entresto, and spironolactone. No indication for loop diuretic at this time.  6. PAD -palpable pulses on exam.  Continue to follow with Dr. Martina Sinner vascular services.  Recommend following up with Dr. Elana Alm if leg swelling does not improve with conservative measures. 7. Weakness -endorses weakness with his wife attributes to inactivity.  Reports no recent falls.  Interested in physical therapy.  Have sent message to his PCP to inquire about ordering therapy.  Disposition: Follow up in 10 month(s) with Dr. [name] or APP.    Loel Dubonnet, NP 04/19/2020, 9:01 PM

## 2020-04-25 DIAGNOSIS — H903 Sensorineural hearing loss, bilateral: Secondary | ICD-10-CM | POA: Diagnosis not present

## 2020-05-04 NOTE — Progress Notes (Signed)
Established patient visit  I,Howard [name],acting as a scribe for [employer], PA.,have documented all relevant documentation on the behalf of Howard Murders, PA,as directed by  [employer], PA while in the presence of [employer], [place].   Patient: Howard [name]   DOB: 12-Aug-1931   86 y.o. Male  MRN: [id number] Visit Date: 05/05/2020  Today's healthcare provider: [name], PA   Chief Complaint  Patient presents with  . Peripheral Neuropathy   Subjective    HPI  The patient is an 86 year old male who presents for face to face in order to start physical therapy.  He is accompanied by his wife.   Most information is being provided by the wife. The patient is diabetic and was last seen on 12/06/19.  The patient was hospitalized in September 2020 and after his discharged he was weak and had home physical therapy.  He had therapy twice a week for about 6 weeks.   The wife reports that by the end of the six weeks he was walking.  Since that time he has progressively gotten worse again. They report numbness in his left foot since prior to the hospitalization. He was last seen by vascular in December and has appointment for follow up on 06/14/20.  They report that the numbness has not gotten any better despite his report in December from the vascular surgeon showed improvement.   He does report that he was walking on the driveway without any shoes and said that the rocks were hurting his feet.  The patient has not been checking his glucose at home.  He denies any symptoms suggestive of hypoglycemia.  He is due for an eye exam as he reports it has been a long time since he was checked.  Wife reports that his has decreased activity and increased sleep. Past Medical History:  Diagnosis Date  . Arthritis   . Bell palsy   . Cancer [facility])    prostate and skin  . Chronic combined systolic and diastolic CHF, NYHA class 1 ([place])    a. 07/2014 Echo: EF 35-40%, Gr 1 DD.  Marland Kitchen  Complete heart block ([place])    a. 11/2010 s/p SJM 2210 [employer] DC PPM, ser# [id number].  . Depression   . Diabetes mellitus without complication ([place])   . Fall 11-10-14  . GERD (gastroesophageal reflux disease)   . History of prostate cancer   . Hyperlipidemia   . Hypertension   . LBBB (left bundle branch block)   . Left-sided Bell's palsy   . Lung cancer ([place]) 2016  . NICM (nonischemic cardiomyopathy) ([place])    a. 07/2014 Echo: EF 35-40%, mid-apicalanteroseptal DK, Gr 1 DD, mild-mod dil LA.  . Non-obstructive CAD    a. 07/2014 Abnl MV;  b. 08/2014 Cath: LM nl, LAD 30p, RI 40p, LCX nl, OM1 40, RCA dominant 30p, 70d-->Med Rx.  Marland Kitchen Poor balance   . Presence of permanent cardiac pacemaker   . Sleep apnea    a. cpap  . Vertigo   . WPW (Wolff-Parkinson-White syndrome)    a. S/P RFCA 1991.   Past Surgical History:  Procedure Laterality Date  . ABDOMINAL AORTIC ENDOVASCULAR STENT GRAFT  08/25/2019   Procedure: ABDOMINAL AORTIC ENDOVASCULAR STENT GRAFT;  Surgeon: Algernon Huxley, MD;  Location: [facility] ORS;  Service: Vascular;;  . ANGIOPLASTY Left 08/25/2019   Procedure: ANGIOPLASTY;  Surgeon: Algernon Huxley, MD;  Location: [facility] ORS;  Service: Vascular;  Laterality: Left;  left SFA and stent placement  . APPLICATION OF WOUND VAC Left 06/07/2015   Procedure: APPLICATION OF WOUND VAC;  Surgeon: Robert Bellow, MD;  Location: [facility] ORS;  Service: General;  Laterality: Left;  left upper back  . BACK SURGERY     2011  . CARDIAC CATHETERIZATION  08/26/2014   Single vessel obstructive CAD  . CARPAL TUNNEL RELEASE  04-04-15   [facility]  . CATARACT EXTRACTION  07-31-11 and 09-18-11  . Catheter ablation  1991   for WPW  . cervical fusion    . CHOLECYSTECTOMY  09-07-14  . ENDARTERECTOMY FEMORAL Left 08/25/2019   Procedure: ENDARTERECTOMY FEMORAL;  Surgeon: Algernon Huxley, MD;  Location: [facility] ORS;  Service: Vascular;  Laterality: Left;  common and produndis   . ENDOVASCULAR REPAIR/STENT GRAFT Right 08/25/2019   Procedure:  ENDOVASCULAR REPAIR/STENT GRAFT;  Surgeon: Algernon Huxley, MD;  Location: [facility] ORS;  Service: Vascular;  Laterality: Right;  renal artery  . HAND SURGERY     right 1993; left 2005  . HERNIA REPAIR  1955  . INSERT / REPLACE / REMOVE PACEMAKER    . INSERTION OF ILIAC STENT Bilateral 08/25/2019   Procedure: INSERTION OF ILIAC STENT;  Surgeon: Algernon Huxley, MD;  Location: [facility] ORS;  Service: Vascular;  Laterality: Bilateral;  . JOINT REPLACEMENT Left 2013   knee  . JOINT REPLACEMENT Right 2004   knee  . KNEE SURGERY     left knee 1991 and 1992; right knee 1995  . LEFT HEART CATHETERIZATION WITH CORONARY ANGIOGRAM N/A 08/26/2014   Procedure: LEFT HEART CATHETERIZATION WITH CORONARY ANGIOGRAM;  Surgeon: [name], MD;  Location: [facility] CATH LAB;  Service: Cardiovascular;  Laterality: N/A;  . LOWER EXTREMITY ANGIOGRAPHY Left 08/23/2019   Procedure: Lower Extremity Angiography;  Surgeon: Algernon Huxley, MD;  Location: [facility] CV LAB;  Service: Cardiovascular;  Laterality: Left;  . LUMBAR LAMINECTOMY/DECOMPRESSION MICRODISCECTOMY N/A 06/07/2014   Procedure: LUMBAR FOUR TO FIVE LUMBAR LAMINECTOMY/DECOMPRESSION MICRODISCECTOMY 1 LEVEL;  Surgeon: [name], MD;  Location: [facility];  Service: Neurosurgery;  Laterality: N/A;  . LUNG BIOPSY Right 2016   Dr [name]  . PACEMAKER INSERTION     PPM-- St Jude 11/30/10 by [name]  . PPM GENERATOR CHANGEOUT N/A 07/09/2019   Procedure: PPM GENERATOR CHANGEOUT;  Surgeon: Deboraha Sprang, MD;  Location: [facility] CV LAB;  Service: Cardiovascular;  Laterality: N/A;  . PROSTATE SURGERY     cancer--1998, prostatectomy  . REPLACEMENT TOTAL KNEE     2004  . ruptured disc     1962 and 1998  . TEE WITHOUT CARDIOVERSION N/A 09/01/2019   Procedure: TRANSESOPHAGEAL ECHOCARDIOGRAM (TEE);  Surgeon: [name], MD;  Location: [facility] ORS;  Service: Cardiovascular;  Laterality: N/A;  . TEMPORARY PACEMAKER N/A 07/09/2019   Procedure: TEMPORARY PACEMAKER;  Surgeon: Deboraha Sprang, MD;  Location: [facility] CV LAB;  Service: Cardiovascular;  Laterality: N/A;  . TRIGGER FINGER RELEASE  01-24-15  . WOUND DEBRIDEMENT Left 06/07/2015   Procedure: DEBRIDEMENT WOUND;  Surgeon: Robert Bellow, MD;  Location: [facility] ORS;  Service: General;  Laterality: Left;  left upper back   Family History  Problem Relation Age of Onset  . Heart attack Mother   . Hyperlipidemia Mother   . CAD Other   . Prostate cancer Neg Hx    Allergies  Allergen Reactions  . Sulfa Antibiotics Rash    Medications: Outpatient Medications Prior to Visit  Medication Sig  .  apixaban (ELIQUIS) 5 MG TABS tablet Take 1 tablet (5 mg total) by mouth 2 (two) times daily.  Marland Kitchen aspirin EC 81 MG tablet Take 81 mg by mouth daily.  . Cholecalciferol (VITAMIN D3) 1000 UNITS CAPS Take 1,000 Units by mouth daily.   Marland Kitchen ezetimibe (ZETIA) 10 MG tablet Take 1 tablet (10 mg total) by mouth daily.  . fluticasone (FLONASE) 50 MCG/ACT nasal spray Place 2 sprays into both nostrils daily.  . isosorbide mononitrate (IMDUR) 30 MG 24 hr tablet Take 1 tablet (30 mg total) by mouth daily.  . metFORMIN (GLUCOPHAGE) 500 MG tablet Take 1 tablet (500 mg total) by mouth 2 (two) times daily with a meal.  . metoprolol succinate (TOPROL-XL) 25 MG 24 hr tablet Take 1 tablet (25 mg total) by mouth at bedtime.  . montelukast (SINGULAIR) 10 MG tablet Take 10 mg by mouth daily as needed (sneezing/allergies.).   Marland Kitchen Multiple Vitamin (MULTIVITAMIN WITH MINERALS) TABS tablet Take 1 tablet by mouth daily. One-A-Day Multivitamin  . omeprazole (PRILOSEC) 40 MG capsule TAKE 1 CAPSULE TWICE DAILY AS NEEDED  . sacubitril-valsartan (ENTRESTO) 24-26 MG Take 1 tablet by mouth 2 (two) times daily.  . simvastatin (ZOCOR) 40 MG tablet TAKE 1 TABLET AT BEDTIME  . spironolactone (ALDACTONE) 25 MG tablet Take 1 tablet (25 mg total) by mouth daily.   No facility-administered medications prior to visit.    Review of Systems  Constitutional: Negative for  fatigue and fever.  Respiratory: Negative for cough, chest tightness, shortness of breath and wheezing.   Cardiovascular: Positive for leg swelling. Negative for chest pain and palpitations.  Endocrine: Negative for polydipsia.  Genitourinary: Negative for frequency.  Neurological: Positive for numbness. Negative for dizziness, weakness and headaches.      Objective    BP (!) 154/80 (BP Location: Right Arm, Patient Position: Sitting, Cuff Size: Normal)   Pulse 68   Temp (!) 96.8 F (36 C) (Skin)   Wt 193 lb (87.5 kg)   SpO2 98%   BMI 28.09 kg/m    Physical Exam Constitutional:      Appearance: Normal appearance.  HENT:     Head: Normocephalic and atraumatic.     Right Ear: Tympanic membrane, ear canal and external ear normal.     Left Ear: Tympanic membrane, ear canal and external ear normal.     Ears:     Comments: Patient has one hearing aid in place.  He is having the other one worked on.     Nose: Nose normal.     Mouth/Throat:     Mouth: Mucous membranes are moist.     Pharynx: Oropharynx is clear.  Eyes:     Extraocular Movements: Extraocular movements intact.     Conjunctiva/sclera: Conjunctivae normal.     Pupils: Pupils are equal, round, and reactive to light.  Cardiovascular:     Rate and Rhythm: Normal rate and regular rhythm.     Pulses: Normal pulses.     Heart sounds: Normal heart sounds.  Pulmonary:     Effort: Pulmonary effort is normal.     Breath sounds: Normal breath sounds.  Abdominal:     General: Abdomen is flat. Bowel sounds are normal.     Palpations: Abdomen is soft.  Musculoskeletal:     Comments: Left foot has some puffiness on the top of it, no real edema. Many superficial varicose veins around ankles.  Skin:    General: Skin is warm and dry.     Capillary  Refill: Capillary refill takes more than 3 seconds.  Neurological:     Mental Status: He is alert and oriented to person, place, and time.     Gait: Gait abnormal (patient drags  his foot).     Diabetic Foot Form - Detailed   Diabetic Foot Exam - detailed Diabetic Foot exam was performed with the following findings: Yes 05/05/2020 10:05 AM  Can the patient see the bottom of their feet?: No Are the shoes appropriate in style and fit?: Yes Is there swelling or and abnormal foot shape?: No Is there a claw toe deformity?: No Is there elevated skin temparature?: No Is there foot or ankle muscle weakness?: No Normal Range of Motion: Yes Right posterior Tibialias: Present Left posterior Tibialias: Diminished  Right Dorsalis Pedis: Present Left Dorsalis Pedis: Diminished  Semmes-Weinstein Monofilament Test R Site 1-Great Toe: Pos L Site 1-Great Toe: Neg        Assessment & Plan     1. Abnormal gait due to peripheral sensory disorder History of multilevel lumbar laminectomies in 2015 and 2016. Some residual neuropathy in the left leg. Has a shuffling gait and loss of sensation in the plantar surface of the left foot. Recent follow up with cardiologist and was suggested he be scheduled for PT for balance and strengthening. - Ambulatory referral to Physical Therapy  2. Weakness Diminished energy level. Will check routine labs. History of CHF with complete heart block followed by Dr. Tresa Garter. Schedule physical therapy for balance training and strengthening. - CBC with Differential/Platelet - Comprehensive metabolic panel - TSH - Ambulatory referral to Physical Therapy  3. Type 2 diabetes mellitus with diabetic neuropathy, unspecified whether long term insulin use (HCC) Hgb A1C was 7.5% 4 months ago. No hypoglycemic episodes. Encouraged to have eye exam annually. Foot exam shows loss of sensation in the plantar surface of the left foot. No signs of diabetic ulcers. Still taking Metformin 500 mg BID and has had both [place] vaccinations as of 01-20-20. Recheck routine labs and urine microalbumen level. - CBC with Differential/Platelet - Comprehensive metabolic  panel - Lipid Panel With LDL/HDL Ratio - TSH - Hemoglobin A1c - Microalbumin / creatinine urine ratio - Ambulatory referral to Physical Therapy  4. Atherosclerosis of native artery of left lower extremity with intermittent claudication ([place]) Long term pains in the left leg that was initially felt to be due to chronic low back pain. Persistence of discomfort lead to vascular evaluation and Dr. Lucky Cowboy found PAD needing abdominal aortic endovascular stent with left SFA and stent placement on 08-25-19. Also, had left lower extremity angiography on 08-23-19. Should continue regular follow up with vascular surgeon as planned.  5. Complete heart block ([place]) St. Jude Pacemaker in the left upper chest followed by cardiologist/EPP (Dr. Berta Minor 2011. No chest pains or palpitations recently. Recheck labs.  6. NICM (nonischemic cardiomyopathy) ([place]) History of CHF and EF 30-35% in 2011. Also, history of WPW with cardiac ablation therapy in 1991. Followed by cardiologist. Presently on Entresto and Spironolactone. Recheck labs.       Andres Shad, PA, have reviewed all documentation for this visit. The documentation on 05/05/20 for the exam, diagnosis, procedures, and orders are all accurate and complete.    Howard [name], [place] [phone] (phone) [phone] (fax)  [place]

## 2020-05-05 ENCOUNTER — Ambulatory Visit (INDEPENDENT_AMBULATORY_CARE_PROVIDER_SITE_OTHER): Payer: Medicare Other | Admitting: Family Medicine

## 2020-05-05 ENCOUNTER — Encounter: Payer: Self-pay | Admitting: Family Medicine

## 2020-05-05 ENCOUNTER — Other Ambulatory Visit: Payer: Self-pay

## 2020-05-05 VITALS — BP 154/80 | HR 68 | Temp 96.8°F | Wt 193.0 lb

## 2020-05-05 DIAGNOSIS — E1140 Type 2 diabetes mellitus with diabetic neuropathy, unspecified: Secondary | ICD-10-CM | POA: Diagnosis not present

## 2020-05-05 DIAGNOSIS — R531 Weakness: Secondary | ICD-10-CM | POA: Diagnosis not present

## 2020-05-05 DIAGNOSIS — R269 Unspecified abnormalities of gait and mobility: Secondary | ICD-10-CM | POA: Diagnosis not present

## 2020-05-05 DIAGNOSIS — I70212 Atherosclerosis of native arteries of extremities with intermittent claudication, left leg: Secondary | ICD-10-CM | POA: Diagnosis not present

## 2020-05-05 DIAGNOSIS — I428 Other cardiomyopathies: Secondary | ICD-10-CM | POA: Diagnosis not present

## 2020-05-05 DIAGNOSIS — I442 Atrioventricular block, complete: Secondary | ICD-10-CM | POA: Diagnosis not present

## 2020-05-05 DIAGNOSIS — G64 Other disorders of peripheral nervous system: Secondary | ICD-10-CM | POA: Diagnosis not present

## 2020-05-07 ENCOUNTER — Other Ambulatory Visit: Payer: Self-pay | Admitting: Cardiovascular Disease

## 2020-05-08 ENCOUNTER — Other Ambulatory Visit: Payer: Self-pay | Admitting: Cardiovascular Disease

## 2020-05-08 DIAGNOSIS — E1140 Type 2 diabetes mellitus with diabetic neuropathy, unspecified: Secondary | ICD-10-CM | POA: Diagnosis not present

## 2020-05-08 DIAGNOSIS — R531 Weakness: Secondary | ICD-10-CM | POA: Diagnosis not present

## 2020-05-09 ENCOUNTER — Telehealth: Payer: Self-pay

## 2020-05-09 LAB — LIPID PANEL WITH LDL/HDL RATIO
Cholesterol, Total: 134 mg/dL (ref 100–199)
HDL: 52 mg/dL (ref >39)
LDL Chol Calc (NIH): 66 mg/dL (ref 0–99)
LDL/HDL Ratio: 1.3 ratio (ref 0.0–3.6)
Triglycerides: 83 mg/dL (ref 0–149)
VLDL Cholesterol Cal: 16 mg/dL (ref 5–40)

## 2020-05-09 LAB — HEMOGLOBIN A1C
Est. average glucose Bld gHb Est-mCnc: 166 mg/dL
Hgb A1c MFr Bld: 7.4 % — ABNORMAL HIGH (ref 4.8–5.6)

## 2020-05-09 LAB — COMPREHENSIVE METABOLIC PANEL
ALT: 10 IU/L (ref 0–44)
AST: 16 IU/L (ref 0–40)
Albumin/Globulin Ratio: 2.1 (ref 1.2–2.2)
Albumin: 4.4 g/dL (ref 3.6–4.6)
Alkaline Phosphatase: 53 IU/L (ref 48–121)
BUN/Creatinine Ratio: 20 (ref 10–24)
BUN: 22 mg/dL (ref 8–27)
Bilirubin Total: 0.4 mg/dL (ref 0.0–1.2)
CO2: 18 mmol/L — ABNORMAL LOW (ref 20–29)
Calcium: 9.3 mg/dL (ref 8.6–10.2)
Chloride: 106 mmol/L (ref 96–106)
Creatinine, Ser: 1.11 mg/dL (ref 0.76–1.27)
GFR calc Af Amer: 69 mL/min/{1.73_m2} (ref >59)
GFR calc non Af Amer: 59 mL/min/{1.73_m2} — ABNORMAL LOW (ref >59)
Globulin, Total: 2.1 g/dL (ref 1.5–4.5)
Glucose: 133 mg/dL — ABNORMAL HIGH (ref 65–99)
Potassium: 4.3 mmol/L (ref 3.5–5.2)
Sodium: 139 mmol/L (ref 134–144)
Total Protein: 6.5 g/dL (ref 6.0–8.5)

## 2020-05-09 LAB — CBC WITH DIFFERENTIAL/PLATELET
Basophils Absolute: 0.1 10*3/uL (ref 0.0–0.2)
Basos: 1 %
EOS (ABSOLUTE): 0.5 10*3/uL — ABNORMAL HIGH (ref 0.0–0.4)
Eos: 9 %
Hematocrit: 37.5 % (ref 37.5–51.0)
Hemoglobin: 12.2 g/dL — ABNORMAL LOW (ref 13.0–17.7)
Immature Grans (Abs): 0 10*3/uL (ref 0.0–0.1)
Immature Granulocytes: 0 %
Lymphocytes Absolute: 1.7 10*3/uL (ref 0.7–3.1)
Lymphs: 28 %
MCH: 25.2 pg — ABNORMAL LOW (ref 26.6–33.0)
MCHC: 32.5 g/dL (ref 31.5–35.7)
MCV: 77 fL — ABNORMAL LOW (ref 79–97)
Monocytes Absolute: 0.7 10*3/uL (ref 0.1–0.9)
Monocytes: 11 %
Neutrophils Absolute: 3.2 10*3/uL (ref 1.4–7.0)
Neutrophils: 51 %
Platelets: 224 10*3/uL (ref 150–450)
RBC: 4.85 x10E6/uL (ref 4.14–5.80)
RDW: 16.6 % — ABNORMAL HIGH (ref 11.6–15.4)
WBC: 6.2 10*3/uL (ref 3.4–10.8)

## 2020-05-09 LAB — MICROALBUMIN / CREATININE URINE RATIO
Creatinine, Urine: 114.8 mg/dL
Microalb/Creat Ratio: 247 mg/g creat — ABNORMAL HIGH (ref 0–29)
Microalbumin, Urine: 283.3 ug/mL

## 2020-05-09 LAB — TSH: TSH: 1.89 u[IU]/mL (ref 0.450–4.500)

## 2020-05-09 NOTE — Telephone Encounter (Signed)
-----   Message from Margo Common, [place] sent at 05/09/2020  8:55 AM EDT ----- Hemoglobin stable. Continue multivitamin with iron daily. Diabetes control is stable. Continue present medication regimen and recheck in 6 months.

## 2020-05-09 NOTE — Telephone Encounter (Signed)
Patient advised as directed below. 

## 2020-05-18 ENCOUNTER — Telehealth: Payer: Self-pay | Admitting: Cardiovascular Disease

## 2020-05-18 NOTE — Telephone Encounter (Signed)
Please call patient wife to discuss dose age for Spironolactone

## 2020-05-18 NOTE — Telephone Encounter (Signed)
Patient very hard of hearing and wife noticed his cardiologist had conflicting instructions of Spironolactone dosage to use. I advised her to use the 25 mg qd instead of 50 mg qd unless she hears any instructions from the cardiologist that contradicts. She denies the patient is having any issues with significant edema, dyspnea or BP changes.

## 2020-05-19 NOTE — Telephone Encounter (Signed)
Left voicemail message to call back  

## 2020-05-19 NOTE — Telephone Encounter (Signed)
No changes were made. He should remain on 25mg  daily of Spironolactone. Thanks! Loel Dubonnet, NP

## 2020-05-24 ENCOUNTER — Encounter: Payer: Self-pay | Admitting: Physical Therapy

## 2020-05-24 ENCOUNTER — Other Ambulatory Visit: Payer: Self-pay

## 2020-05-24 ENCOUNTER — Ambulatory Visit: Payer: Medicare Other | Attending: Family Medicine | Admitting: Physical Therapy

## 2020-05-24 DIAGNOSIS — R42 Dizziness and giddiness: Secondary | ICD-10-CM

## 2020-05-24 DIAGNOSIS — R262 Difficulty in walking, not elsewhere classified: Secondary | ICD-10-CM

## 2020-05-24 DIAGNOSIS — R296 Repeated falls: Secondary | ICD-10-CM | POA: Diagnosis present

## 2020-05-24 DIAGNOSIS — M6281 Muscle weakness (generalized): Secondary | ICD-10-CM | POA: Diagnosis present

## 2020-05-24 NOTE — Therapy (Signed)
[facility] [address] [place], [address] Phone: [phone]   Fax:  [phone]  Physical Therapy Treatment  Patient Details  Name: Howard [name] MRN: [id number] Date of Birth: [birth year] No data recorded  Encounter Date: 05/24/2020  PT End of Session - 05/24/20 1231    Visit Number  1    Number of Visits  17    Date for PT Re-Evaluation  07/19/20    PT Start Time  1055    PT Stop Time  1200    PT Time Calculation (min)  65 min    Equipment Utilized During Treatment  Gait belt    Activity Tolerance  Patient tolerated treatment well    Behavior During Therapy  [facility] for tasks assessed/performed       Past Medical History:  Diagnosis Date  . Arthritis   . Bell palsy   . Cancer [facility])    prostate and skin  . Chronic combined systolic and diastolic CHF, NYHA class 1 ([place])    a. 07/2014 Echo: EF 35-40%, Gr 1 DD.  Marland Kitchen Complete heart block ([place])    a. 11/2010 s/p SJM 2210 [employer] DC PPM, ser# [id number].  . Depression   . Diabetes mellitus without complication ([place])   . Fall 11-10-14  . GERD (gastroesophageal reflux disease)   . History of prostate cancer   . Hyperlipidemia   . Hypertension   . LBBB (left bundle branch block)   . Left-sided Bell's palsy   . Lung cancer ([place]) 2016  . NICM (nonischemic cardiomyopathy) ([place])    a. 07/2014 Echo: EF 35-40%, mid-apicalanteroseptal DK, Gr 1 DD, mild-mod dil LA.  . Non-obstructive CAD    a. 07/2014 Abnl MV;  b. 08/2014 Cath: LM nl, LAD 30p, RI 40p, LCX nl, OM1 40, RCA dominant 30p, 70d-->Med Rx.  Marland Kitchen Poor balance   . Presence of permanent cardiac pacemaker   . Sleep apnea    a. cpap  . Vertigo   . WPW (Wolff-Parkinson-White syndrome)    a. S/P RFCA 1991.    Past Surgical History:  Procedure Laterality Date  . ABDOMINAL AORTIC ENDOVASCULAR STENT GRAFT  08/25/2019   Procedure: ABDOMINAL AORTIC ENDOVASCULAR STENT GRAFT;  Surgeon: Algernon Huxley, MD;  Location: [facility]  ORS;  Service: Vascular;;  . ANGIOPLASTY Left 08/25/2019   Procedure: ANGIOPLASTY;  Surgeon: Algernon Huxley, MD;  Location: [facility] ORS;  Service: Vascular;  Laterality: Left;  left SFA and stent placement  . APPLICATION OF WOUND VAC Left 06/07/2015   Procedure: APPLICATION OF WOUND VAC;  Surgeon: Robert Bellow, MD;  Location: [facility] ORS;  Service: General;  Laterality: Left;  left upper back  . BACK SURGERY     2011  . CARDIAC CATHETERIZATION  08/26/2014   Single vessel obstructive CAD  . CARPAL TUNNEL RELEASE  04-04-15   [facility]  . CATARACT EXTRACTION  07-31-11 and 09-18-11  . Catheter ablation  1991   for WPW  . cervical fusion    . CHOLECYSTECTOMY  09-07-14  . ENDARTERECTOMY FEMORAL Left 08/25/2019   Procedure: ENDARTERECTOMY FEMORAL;  Surgeon: Algernon Huxley, MD;  Location: [facility] ORS;  Service: Vascular;  Laterality: Left;  common and produndis   . ENDOVASCULAR REPAIR/STENT GRAFT Right 08/25/2019   Procedure: ENDOVASCULAR REPAIR/STENT GRAFT;  Surgeon: Algernon Huxley, MD;  Location: [facility] ORS;  Service: Vascular;  Laterality: Right;  renal artery  . HAND SURGERY     right 1993;  left 2005  . HERNIA REPAIR  1955  . INSERT / REPLACE / REMOVE PACEMAKER    . INSERTION OF ILIAC STENT Bilateral 08/25/2019   Procedure: INSERTION OF ILIAC STENT;  Surgeon: Algernon Huxley, MD;  Location: [facility] ORS;  Service: Vascular;  Laterality: Bilateral;  . JOINT REPLACEMENT Left 2013   knee  . JOINT REPLACEMENT Right 2004   knee  . KNEE SURGERY     left knee 1991 and 1992; right knee 1995  . LEFT HEART CATHETERIZATION WITH CORONARY ANGIOGRAM N/A 08/26/2014   Procedure: LEFT HEART CATHETERIZATION WITH CORONARY ANGIOGRAM;  Surgeon: [name], MD;  Location: [facility] CATH LAB;  Service: Cardiovascular;  Laterality: N/A;  . LOWER EXTREMITY ANGIOGRAPHY Left 08/23/2019   Procedure: Lower Extremity Angiography;  Surgeon: Algernon Huxley, MD;  Location: [facility] CV LAB;  Service: Cardiovascular;  Laterality: Left;  . LUMBAR  LAMINECTOMY/DECOMPRESSION MICRODISCECTOMY N/A 06/07/2014   Procedure: LUMBAR FOUR TO FIVE LUMBAR LAMINECTOMY/DECOMPRESSION MICRODISCECTOMY 1 LEVEL;  Surgeon: [name], MD;  Location: [place] NEURO ORS;  Service: Neurosurgery;  Laterality: N/A;  . LUNG BIOPSY Right 2016   Dr [name]  . PACEMAKER INSERTION     PPM-- St Jude 11/30/10 by [name]  . PPM GENERATOR CHANGEOUT N/A 07/09/2019   Procedure: PPM GENERATOR CHANGEOUT;  Surgeon: Deboraha Sprang, MD;  Location: [place] CV LAB;  Service: Cardiovascular;  Laterality: N/A;  . PROSTATE SURGERY     cancer--1998, prostatectomy  . REPLACEMENT TOTAL KNEE     2004  . ruptured disc     1962 and 1998  . TEE WITHOUT CARDIOVERSION N/A 09/01/2019   Procedure: TRANSESOPHAGEAL ECHOCARDIOGRAM (TEE);  Surgeon: [name], MD;  Location: [facility] ORS;  Service: Cardiovascular;  Laterality: N/A;  . TEMPORARY PACEMAKER N/A 07/09/2019   Procedure: TEMPORARY PACEMAKER;  Surgeon: Deboraha Sprang, MD;  Location: [place] CV LAB;  Service: Cardiovascular;  Laterality: N/A;  . TRIGGER FINGER RELEASE  01-24-15  . WOUND DEBRIDEMENT Left 06/07/2015   Procedure: DEBRIDEMENT WOUND;  Surgeon: Robert Bellow, MD;  Location: [facility] ORS;  Service: General;  Laterality: Left;  left upper back    There were no vitals filed for this visit.  Subjective Assessment - 05/24/20 1100    Pertinent History  Pt is a 86 y/o male familiar with this clinic, that presents to therapy with gait and strength deficits since vascular surgery in Sept 2020. Patient had difficulty ambulating prior to this for a "few months", and eventually was hospitalized for cellulitis of his L toe d/t decreased blood flow. Following vascular surgery in Sept 2020, patient had HHPT for 6 weeks and was doing "pretty well". Reports over the month he has had insideous onset of increased L foot swelling and decreased balance with gait. He notes that his swelling is down today (visably not swollen), but that his feet have been  swelling at night, and that is made somewhat better by propping it up; denies any pain. Patient has been using a SPC for "years" but ambulates into clinic today with loftstrand cruch in [place], which he reports someone gave to him recently, and he thinks this is helping his balance. Patient has a history of DM2 with periphreal neuropathy in L foot, reporting deminished sensation "all over" the L foot. Denies any falls in the past 6 months, but reports his wife thinks he is very unstable. Prior to surgery patient was walking daily, but reports he has not done this in some time.  Would like to return to walking "a few times a week", decrease swelling in his L foot, and increase his balance as he does report he has to catch himself often when he is entering the 3 steps into his home (bilat handrails) and has been furniture walking recently. Pt denies N/V, B&B changes, unexplained weight fluctuation, saddle paresthesia, fever, night sweats, or unrelenting night pain at this time.    Limitations  Lifting;Walking;House hold activities    How long can you sit comfortably?  Unlimited    How long can you stand comfortably?  unlmited    How long can you walk comfortably?  63mins    Patient Stated Goals  Walk without having to grab onto things        OBJECTIVE  MUSCULOSKELETAL: Tremor: Absent Bulk: Normal Tone: Normal, no clonus  Posture Increased thoracic kyphosis, decreased lumbar lordosis, increased R WB   Gait Narrow BOS with loftstrand crutch in RUE, L midfoot pronation at most steps with increased R shift, occasional L scissor pattern that results in R lateral displacement. Decreased hip ext at terminal stance bilat with resultant decreased step length bilat  Strength R/L 5/5 Hip flexion 5/5 Hip external rotation 5/5 Hip internal rotation 5/5 Hip extension  3+/3+ Hip abduction 5/5 Hip adduction 3/3- Knee extension 5/5 Knee flexion 5/5 Ankle Plantarflexion 5/5 Ankle  Dorsiflexion  NEUROLOGICAL:  Mental Status Patient is oriented to person, place and time.  Recent memory is intact.  Remote memory is intact.  Attention span and concentration are intact.  Expressive speech is intact.  Patient's fund of knowledge is within normal limits for educational level.  Sensation Grossly intact to light touch bilateral UEs/LEs as determined by testing dermatomes C2-T2/L2-S2 respectively; except gross sensation limited at L foot d/t peripheral neuropathy, reports he feels touch to dorsum of foot, but it is very deminished Proprioception and hot/cold testing deferred on this date  Reflexes R/L 2+/2+ Knee Jerk (L3/4) 2+/2+ Ankle Jerk (S1/2)  Coordination/Cerebellar Finger to Nose: WNL Heel to Shin: WNL Rapid alternating movements: WNL Finger Opposition: WNL Pronator Drift: Negative  FUNCTIONAL OUTCOME MEASURES   Results Comments  BERG 38/56 Fall risk, in need of intervention  DGI 16/24   FGA 13/30   TUG 14seconds   5TSTS 18 seconds   6 Minute Walk Test Not completed    10 Meter Gait Speed Self-selected: s = 0.35m/s; Fastest: s = 0.30m/s Below normative values for full community ambulation            POSTURAL CONTROL TESTS   Modified Clinical Test of Sensory Interaction for Balance    (CTSIB):  CONDITION TIME STRATEGY SWAY  Eyes open, firm surface 30 seconds ankle none  Eyes closed, firm surface 30 seconds ankle mild  Eyes open, foam surface 30 seconds Ankle and hip moderate  Eyes closed, foam surface 0 seconds Unable step    SLS: R 1sec L: unable Stairs: bilat handrails needed, narrow BOS, and increased hip drop with CL  Supine <> sit modI STS modI without UE, just some increased time for initiation   Ther-Ex PT reviewed the following HEP with patient, with patient able to complete a set of the following with min cuing needed for correction of technique. PT educated patient on parameters of therex with patient verbalizing understanding.  PT educating patient on findings from evaluation and different body functions that create balance, and detriment of balance disorders (not limited to: strength, sensation, vestibular and visual input).  [id number] Sit to Stand  without Arm Support - 1 x daily - 3 x weekly - 3 sets - 10 reps  Standing Hip Abduction - 1 x daily - 3 x weekly - 3 sets - 10 reps  Stagger Stance for Balance - 1 x daily - 7 x weekly - 3 sets - 30sec hold           .                               PT Education - 05/24/20 1112    Education Details  Patient was educated on diagnosis, anatomy and pathology involved, prognosis, role of PT, and was given an HEP, demonstrating exercise with proper form following verbal and tactile cues, and was given a paper hand out to continue exercise at home. Pt was educated on and agreed to plan of care.    Person(s) Educated  Patient    Methods  Explanation;Demonstration;Tactile cues;Verbal cues;Handout    Comprehension  Verbalized understanding;Returned demonstration;Verbal cues required;Tactile cues required       PT Short Term Goals - 05/24/20 1237      PT SHORT TERM GOAL #1   Title  Pt will be independent with HEP in order to improve strength and balance in order to decrease fall risk and improve function at home and work.    Baseline  06/12/20 HEP given    Time  4    Period  Weeks    Status  New      PT SHORT TERM GOAL #2   Title  Pt will improve BERG by at least 3 points in order to demonstrate clinically significant improvement in balance.    Baseline  05/24/20 38/56    Time  5    Period  Weeks    Status  New      PT SHORT TERM GOAL #3   Title  Pt will improve DGI by at least 3 points in order to demonstrate clinically significant improvement in balance and decreased risk for falls.    Baseline  05/24/20 16/24    Time  5    Status  New        PT Long Term Goals - 05/24/20 1247      PT LONG TERM GOAL #1   Title  Pt will  demonstrate gait speed of 1.44m/s or faster to demonstrate normal community walk speed    Baseline  05/24/20 0.69m/s    Time  8    Period  Weeks    Status  New      PT LONG TERM GOAL #2   Title  Pt will demonstrate FGA score of at least 22/30 to demonstrate least fall risk in community    Baseline  05/24/20 13/30    Time  8    Period  Weeks    Status  New      PT LONG TERM GOAL #3   Title  Pt will demonstrate BERG score of at least 46/56 to demonstrate decreased community fall risk    Baseline  05/24/20 38/56    Time  8    Period  Weeks    Status  New      PT LONG TERM GOAL #4   Title  Pt will decrease 5TSTS to at least 14.8 seconds in order to demonstrate clinically significant improvement and age matched norm in LE strength.    Baseline  05/24/20 18sec  Time  8    Period  Weeks    Status  New      PT LONG TERM GOAL #5   Title  Pt will decrease TUG to below 12 seconds in order to demonstrate decreased fall risk.    Baseline  05/24/20 14sec    Time  8    Period  Weeks            Plan - 05/24/20 1253    Clinical Impression Statement  Pt is a 86 year old male presenting with gait, balance, and strength deficits persistent over the past month, following some increased L foot neuropathy and vascular issues. Impairments in decreased static balance, dynamic balance, hip strength, postural deficits, gait disturbances, and sensation. Activity limitations in stair negotation, prolonged ambulation, SLS activities, and ambulation on uneven surfaces; inhibiting participation restrictions in walknig through his yard to feed his birds/complete yard work, and safe household and community ambulation. Pt will benefit from skilled PT to address deficits to improve QOL and return patient to PLOF    Personal Factors and Comorbidities  Age;Comorbidity 1;Comorbidity 3+;Comorbidity 2;Past/Current Experience;Fitness    Comorbidities  DM2 with periphreal nueropathy, GERD, HTN, HLD, past lung cancer,  arthritis, vertigo, PAD    Examination-Activity Limitations  Stairs;Carry;Squat;Locomotion Level;Transfers    Examination-Participation Restrictions  Cleaning;Yard Work;Community Activity;Church    Stability/Clinical Decision Making  Evolving/Moderate complexity    Clinical Decision Making  Moderate    Rehab Potential  Good    PT Frequency  2x / week    PT Duration  8 weeks    PT Treatment/Interventions  ADLs/Self Care Home Management;Electrical Stimulation;Therapeutic activities;Patient/family education;Therapeutic exercise;DME Instruction;Gait training;Stair training;Moist Heat;Cryotherapy;Ultrasound;Aquatic Therapy;Functional mobility training;Manual techniques;Dry needling;Passive range of motion;Neuromuscular re-education;Balance training;Vestibular;Joint Manipulations;Spinal Manipulations;Energy conservation    PT Next Visit Plan  6MWT?, dynamic balance, tband to HEP    PT Home Exercise Plan  STS, standing hip abd, stagger stance    Consulted and Agree with Plan of Care  Patient       Patient will benefit from skilled therapeutic intervention in order to improve the following deficits and impairments:  Abnormal gait, Dizziness, Impaired sensation, Improper body mechanics, Postural dysfunction, Decreased mobility, Decreased coordination, Decreased activity tolerance, Decreased endurance, Decreased strength, Decreased balance, Difficulty walking  Visit Diagnosis: Difficulty in walking, not elsewhere classified  Muscle weakness (generalized)  Dizziness and giddiness     Problem List Patient Active Problem List   Diagnosis Date Noted  . AAA (abdominal aortic aneurysm) without rupture ([place]) 12/10/2019  . Renal artery stenosis ([place]) 12/10/2019  . PAD (peripheral artery disease) ([place]) 12/10/2019  . Thromboembolism ([place]) 08/30/2019  . Atherosclerosis of native arteries of extremity with intermittent claudication ([place]) 08/23/2019  . Cellulitis 08/22/2019  . Swelling of limb  08/17/2019  . Pain in limb 08/17/2019  . NICM (nonischemic cardiomyopathy) ([place]) 07/01/2019  . CHF (congestive heart failure) ([place]) 07/01/2019  . Malignant neoplasm of right lung ([place]) 04/18/2016  . CAD (coronary artery disease) 04/01/2016  . Adenocarcinoma ([place]) 04/01/2016  . Skin cyst 12/26/2015  . GERD (gastroesophageal reflux disease) 06/16/2015  . Abscess of back 06/06/2015  . Vertigo 03/27/2015  . Carpal tunnel syndrome 10/28/2014  . Status post cholecystectomy 09/29/2014  . Disease of digestive tract 09/29/2014  . Cardiomyopathy ([place]) 09/26/2014  . Type 2 diabetes mellitus without complications ([place])   . Sleep apnea   . Spinal stenosis, lumbar region, with neurogenic claudication 06/07/2014  . Lumbar stenosis with neurogenic claudication 06/07/2014  . Abnormal gait 08/20/2012  .  H/O total knee replacement 08/20/2012  . Arthritis of knee, degenerative 08/20/2012  . Pacemaker-St.Jude 08/03/2012  . Cardiac conduction disorder 06/19/2012  . Acid reflux 06/18/2012  . 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 Triggering of digit 03/25/2012  . Hyperlipidemia 12/23/2011  . Essential hypertension 03/23/2011  . Atrioventricular block, complete ([place]) 03/23/2011  . Complete atrioventricular block ([place]) 03/23/2011   Howard [name] PT, DPT Howard [name] 05/24/2020, 1:01 PM   [facility] [address], [place], [address] Phone: [phone]   Fax:  [phone]  Name: Howard [name] MRN: [id number] Date of Birth: 1931/01/01

## 2020-05-25 ENCOUNTER — Ambulatory Visit (INDEPENDENT_AMBULATORY_CARE_PROVIDER_SITE_OTHER): Payer: Medicare Other

## 2020-05-25 ENCOUNTER — Ambulatory Visit (INDEPENDENT_AMBULATORY_CARE_PROVIDER_SITE_OTHER): Payer: Medicare Other | Admitting: Nurse Practitioner

## 2020-05-25 ENCOUNTER — Encounter (INDEPENDENT_AMBULATORY_CARE_PROVIDER_SITE_OTHER): Payer: Self-pay | Admitting: Nurse Practitioner

## 2020-05-25 VITALS — BP 156/87 | HR 78 | Ht 69.0 in | Wt 188.0 lb

## 2020-05-25 DIAGNOSIS — I739 Peripheral vascular disease, unspecified: Secondary | ICD-10-CM

## 2020-05-25 DIAGNOSIS — I701 Atherosclerosis of renal artery: Secondary | ICD-10-CM | POA: Diagnosis not present

## 2020-05-25 DIAGNOSIS — I714 Abdominal aortic aneurysm, without rupture, unspecified: Secondary | ICD-10-CM

## 2020-05-25 DIAGNOSIS — I70212 Atherosclerosis of native arteries of extremities with intermittent claudication, left leg: Secondary | ICD-10-CM

## 2020-05-25 DIAGNOSIS — I10 Essential (primary) hypertension: Secondary | ICD-10-CM

## 2020-05-25 NOTE — Progress Notes (Signed)
Subjective:    Patient ID: Howard [name], male    DOB: [birth year], 86 y.o.   MRN: [id number] Chief Complaint  Patient presents with  . Follow-up    U/S Followup    Patient returns today in follow up of PAD and other vascular issues.  The patient is doing very well since last time I saw him.  He is currently ambulating without a walker although he does note that he is still having some difficulties with his balance due to his left foot being numb.  However the patient has recently started physical therapy.  His blood pressure has been stable.  No additional complaints today.  Swelling has been well controlled.  The patient notes no evidence of rest pain or significant claudication.   Today the patient underwent noninvasive studies which showed an ABI of 1.18 on the right and 1.13 on the left.  Previous ABIs done on 12/10/2019 showed an ABI of 1.26 on the right and 1.40 on the left.  Today the EVAR shows no evidence of endoleak with a diameter of 2.3 cm compared to 4.3 cm on 09/08/2019.   Review of Systems  Musculoskeletal: Positive for gait problem.  Neurological: Positive for numbness.  All other systems reviewed and are negative.      Objective:   Physical Exam Vitals reviewed.  HENT:     Head: Normocephalic.  Cardiovascular:     Rate and Rhythm: Normal rate and regular rhythm.     Pulses: Normal pulses.     Heart sounds: Normal heart sounds.  Pulmonary:     Effort: Pulmonary effort is normal.     Breath sounds: Normal breath sounds.  Neurological:     Mental Status: He is alert and oriented to person, place, and time.  Psychiatric:        Mood and Affect: Mood normal.        Behavior: Behavior normal.        Thought Content: Thought content normal.        Judgment: Judgment normal.     BP (!) 156/87   Pulse 78   Ht 5\' 9"  (1.753 m)   Wt 188 lb (85.3 kg)   BMI 27.76 kg/m   Past Medical History:  Diagnosis Date  . Arthritis   . Bell palsy   . Cancer [facility])    prostate and skin  . Chronic combined systolic and diastolic CHF, NYHA class 1 ([place])    a. 07/2014 Echo: EF 35-40%, Gr 1 DD.  Marland Kitchen Complete heart block ([place])    a. 11/2010 s/p SJM 2210 Accent DC PPM, ser# [id number].  . Depression   . Diabetes mellitus without complication ([place])   . Fall 11-10-14  . GERD (gastroesophageal reflux disease)   . History of prostate cancer   . Hyperlipidemia   . Hypertension   . LBBB (left bundle branch block)   . Left-sided Bell's palsy   . Lung cancer ([place]) 2016  . NICM (nonischemic cardiomyopathy) ([place])    a. 07/2014 Echo: EF 35-40%, mid-apicalanteroseptal DK, Gr 1 DD, mild-mod dil LA.  . Non-obstructive CAD    a. 07/2014 Abnl MV;  b. 08/2014 Cath: LM nl, LAD 30p, RI 40p, LCX nl, OM1 40, RCA dominant 30p, 70d-->Med Rx.  Marland Kitchen Poor balance   . Presence of permanent cardiac pacemaker   . Sleep apnea    a. cpap  . Vertigo   . WPW (Wolff-Parkinson-White syndrome)    a. S/P RFCA 1991.  Social History   Socioeconomic History  . Marital status: Married    Spouse name: Not on file  . Number of children: 2  . Years of education: College  . Highest education level: Some college, no degree  Occupational History  . Occupation: Retired  Tobacco Use  . Smoking status: Former Smoker    Years: 4.00  . Smokeless tobacco: Never Used  . Tobacco comment: Quit 2011  Substance and Sexual Activity  . Alcohol use: No  . Drug use: No  . Sexual activity: Not on file  Other Topics Concern  . Not on file  Social History Narrative   Drinks 2 cups of coffee a day    Social Determinants of Health   Financial Resource Strain:   . Difficulty of Paying Living Expenses:   Food Insecurity:   . Worried About Charity fundraiser in the Last Year:   . Arboriculturist in the Last Year:   Transportation Needs:   . Film/video editor (Medical):   Marland Kitchen Lack of Transportation (Non-Medical):   Physical Activity:   . Days of Exercise per Week:   . Minutes of Exercise per  Session:   Stress:   . Feeling of Stress :   Social Connections:   . Frequency of Communication with Friends and Family:   . Frequency of Social Gatherings with Friends and Family:   . Attends Religious Services:   . Active Member of Clubs or Organizations:   . Attends Archivist Meetings:   Marland Kitchen Marital Status:   Intimate Partner Violence:   . Fear of Current or Ex-Partner:   . Emotionally Abused:   Marland Kitchen Physically Abused:   . Sexually Abused:     Past Surgical History:  Procedure Laterality Date  . ABDOMINAL AORTIC ENDOVASCULAR STENT GRAFT  08/25/2019   Procedure: ABDOMINAL AORTIC ENDOVASCULAR STENT GRAFT;  Surgeon: Algernon Huxley, MD;  Location: [facility] ORS;  Service: Vascular;;  . ANGIOPLASTY Left 08/25/2019   Procedure: ANGIOPLASTY;  Surgeon: Algernon Huxley, MD;  Location: [facility] ORS;  Service: Vascular;  Laterality: Left;  left SFA and stent placement  . APPLICATION OF WOUND VAC Left 06/07/2015   Procedure: APPLICATION OF WOUND VAC;  Surgeon: Robert Bellow, MD;  Location: [facility] ORS;  Service: General;  Laterality: Left;  left upper back  . BACK SURGERY     2011  . CARDIAC CATHETERIZATION  08/26/2014   Single vessel obstructive CAD  . CARPAL TUNNEL RELEASE  04-04-15   [facility]  . CATARACT EXTRACTION  07-31-11 and 09-18-11  . Catheter ablation  1991   for WPW  . cervical fusion    . CHOLECYSTECTOMY  09-07-14  . ENDARTERECTOMY FEMORAL Left 08/25/2019   Procedure: ENDARTERECTOMY FEMORAL;  Surgeon: Algernon Huxley, MD;  Location: [facility] ORS;  Service: Vascular;  Laterality: Left;  common and produndis   . ENDOVASCULAR REPAIR/STENT GRAFT Right 08/25/2019   Procedure: ENDOVASCULAR REPAIR/STENT GRAFT;  Surgeon: Algernon Huxley, MD;  Location: [facility] OR[facility];  Service: Vascular;  Laterality: Right;  renal artery  . HAND SURGERY     right 1993; left 2005  . HERNIA REPAIR  1955  . INSERT / REPLACE / REMOVE PACEMAKER    . INSERTION OF ILIAC STENT Bilateral 08/25/2019   Procedure: INSERTION OF ILIAC STENT;  Surgeon:  Algernon Huxley, MD;  Location: [facility] ORS;  Service: Vascular;  Laterality: Bilateral;  . JOINT REPLACEMENT Left 2013   knee  . JOINT REPLACEMENT Right  2004   knee  . KNEE SURGERY     left knee 1991 and 1992; right knee 1995  . LEFT HEART CATHETERIZATION WITH CORONARY ANGIOGRAM N/A 08/26/2014   Procedure: LEFT HEART CATHETERIZATION WITH CORONARY ANGIOGRAM;  Surgeon: [name], MD;  Location: [facility] CATH LAB;  Service: Cardiovascular;  Laterality: N/A;  . LOWER EXTREMITY ANGIOGRAPHY Left 08/23/2019   Procedure: Lower Extremity Angiography;  Surgeon: Algernon Huxley, MD;  Location: [place] CV LAB;  Service: Cardiovascular;  Laterality: Left;  . LUMBAR LAMINECTOMY/DECOMPRESSION MICRODISCECTOMY N/A 06/07/2014   Procedure: LUMBAR FOUR TO FIVE LUMBAR LAMINECTOMY/DECOMPRESSION MICRODISCECTOMY 1 LEVEL;  Surgeon: [name], MD;  Location: [place] NEURO ORS;  Service: Neurosurgery;  Laterality: N/A;  . LUNG BIOPSY Right 2016   Dr [name]  . PACEMAKER INSERTION     PPM-- St Jude 11/30/10 by [name]  . PPM GENERATOR CHANGEOUT N/A 07/09/2019   Procedure: PPM GENERATOR CHANGEOUT;  Surgeon: Deboraha Sprang, MD;  Location: [place] CV LAB;  Service: Cardiovascular;  Laterality: N/A;  . PROSTATE SURGERY     cancer--1998, prostatectomy  . REPLACEMENT TOTAL KNEE     2004  . ruptured disc     1962 and 1998  . TEE WITHOUT CARDIOVERSION N/A 09/01/2019   Procedure: TRANSESOPHAGEAL ECHOCARDIOGRAM (TEE);  Surgeon: [name], MD;  Location: [facility] OR[facility];  Service: Cardiovascular;  Laterality: N/A;  . TEMPORARY PACEMAKER N/A 07/09/2019   Procedure: TEMPORARY PACEMAKER;  Surgeon: Deboraha Sprang, MD;  Location: [place] CV LAB;  Service: Cardiovascular;  Laterality: N/A;  . TRIGGER FINGER RELEASE  01-24-15  . WOUND DEBRIDEMENT Left 06/07/2015   Procedure: DEBRIDEMENT WOUND;  Surgeon: Robert Bellow, MD;  Location: [facility] ORS;  Service: General;  Laterality: Left;  left upper back    Family History  Problem Relation Age  of Onset  . Heart attack Mother   . Hyperlipidemia Mother   . CAD Other   . Prostate cancer Neg Hx     Allergies  Allergen Reactions  . Sulfa Antibiotics Rash       Assessment & Plan:   1. Atherosclerosis of native artery of left lower extremity with intermittent claudication (HCC)  Recommend:  The patient has evidence of atherosclerosis of the lower extremities.  The patient does not voice lifestyle limiting changes at this point in time.  Noninvasive studies do not suggest clinically significant change.  No invasive studies, angiography or surgery at this time The patient should continue walking and begin a more formal exercise program.  The patient should continue antiplatelet therapy and aggressive treatment of the lipid abnormalities  No changes in the patient's medications at this time  The patient should continue wearing graduated compression socks 10-15 mmHg strength to control the mild edema.    2. AAA (abdominal aortic aneurysm) without rupture (HCC) Recommend: Patient is status post successful endovascular repair of the AAA.   No further intervention is required at this time.   No endoleak is detected and the aneurysm sac is stable.  The patient will continue antiplatelet therapy as prescribed as well as aggressive management of hyperlipidemia. Exercise is again strongly encouraged.   However, endografts require continued surveillance with ultrasound or CT scan. This is mandatory to detect any changes that allow repressurization of the aneurysm sac.  The patient is informed that this would be asymptomatic.  The patient is reminded that lifelong routine surveillance is a necessity with an endograft. Patient will continue to follow-up at 12 month intervals  with ultrasound of the aorta.  3. Renal artery stenosis (HCC) Today blood pressure is doing well.  Previous renal duplex showed that his stent was patent.  We will follow up with renal duplex at patient's next  follow-up visit.  4. Essential hypertension Continue antihypertensive medications as already ordered, these medications have been reviewed and there are no changes at this time.    Current Outpatient Medications on File Prior to Visit  Medication Sig Dispense Refill  . apixaban (ELIQUIS) 5 MG TABS tablet Take 1 tablet (5 mg total) by mouth 2 (two) times daily. 60 tablet 60  . aspirin EC 81 MG tablet Take 81 mg by mouth daily.    . Cholecalciferol (VITAMIN D3) 1000 UNITS CAPS Take 1,000 Units by mouth daily.     Marland Kitchen ENTRESTO 24-26 MG TAKE 1 TABLET TWICE A DAY 90 tablet 3  . ezetimibe (ZETIA) 10 MG tablet Take 1 tablet (10 mg total) by mouth daily. 90 tablet 3  . fluticasone (FLONASE) 50 MCG/ACT nasal spray Place 2 sprays into both nostrils daily.    . isosorbide mononitrate (IMDUR) 30 MG 24 hr tablet TAKE 1 TABLET DAILY 90 tablet 2  . metFORMIN (GLUCOPHAGE) 500 MG tablet Take 1 tablet (500 mg total) by mouth 2 (two) times daily with a meal. 180 tablet 3  . metoprolol succinate (TOPROL-XL) 25 MG 24 hr tablet Take 1 tablet (25 mg total) by mouth at bedtime. 90 tablet 3  . montelukast (SINGULAIR) 10 MG tablet Take 10 mg by mouth daily as needed (sneezing/allergies.).     Marland Kitchen Multiple Vitamin (MULTIVITAMIN WITH MINERALS) TABS tablet Take 1 tablet by mouth daily. One-A-Day Multivitamin    . omeprazole (PRILOSEC) 40 MG capsule TAKE 1 CAPSULE TWICE DAILY AS NEEDED 180 capsule 3  . simvastatin (ZOCOR) 40 MG tablet TAKE 1 TABLET AT BEDTIME 90 tablet 3  . spironolactone (ALDACTONE) 25 MG tablet Take 1 tablet (25 mg total) by mouth daily. 90 tablet 3   No current facility-administered medications on file prior to visit.    There are no Patient Instructions on file for this visit. No follow-ups on file.   Kris Hartmann, NP

## 2020-05-26 ENCOUNTER — Encounter: Payer: Self-pay | Admitting: Physical Therapy

## 2020-05-26 ENCOUNTER — Other Ambulatory Visit: Payer: Self-pay

## 2020-05-26 ENCOUNTER — Ambulatory Visit: Payer: Medicare Other | Admitting: Physical Therapy

## 2020-05-26 DIAGNOSIS — R42 Dizziness and giddiness: Secondary | ICD-10-CM

## 2020-05-26 DIAGNOSIS — M6281 Muscle weakness (generalized): Secondary | ICD-10-CM

## 2020-05-26 DIAGNOSIS — R262 Difficulty in walking, not elsewhere classified: Secondary | ICD-10-CM

## 2020-05-26 NOTE — Therapy (Signed)
[facility] [address], [place], [address] Phone: [phone]   Fax:  [phone]  Physical Therapy Treatment  Patient Details  Name: Howard [name] MRN: [id number] Date of Birth: 1931/06/01 No data recorded  Encounter Date: 05/26/2020  PT End of Session - 05/26/20 0908    Visit Number  2    Number of Visits  17    Date for PT Re-Evaluation  07/19/20    PT Start Time  0900    PT Stop Time  0940    PT Time Calculation (min)  40 min    Equipment Utilized During Treatment  Gait belt    Activity Tolerance  Patient tolerated treatment well    Behavior During Therapy  [facility] for tasks assessed/performed       Past Medical History:  Diagnosis Date  . Arthritis   . Bell palsy   . Cancer [facility])    prostate and skin  . Chronic combined systolic and diastolic CHF, NYHA class 1 ([place])    a. 07/2014 Echo: EF 35-40%, Gr 1 DD.  Marland Kitchen Complete heart block ([place])    a. 11/2010 s/p SJM 2210 [employer] DC PPM, ser# [id number].  . Depression   . Diabetes mellitus without complication ([place])   . Fall 11-10-14  . GERD (gastroesophageal reflux disease)   . History of prostate cancer   . Hyperlipidemia   . Hypertension   . LBBB (left bundle branch block)   . Left-sided Bell's palsy   . Lung cancer ([place]) 2016  . NICM (nonischemic cardiomyopathy) ([place])    a. 07/2014 Echo: EF 35-40%, mid-apicalanteroseptal DK, Gr 1 DD, mild-mod dil LA.  . Non-obstructive CAD    a. 07/2014 Abnl MV;  b. 08/2014 Cath: LM nl, LAD 30p, RI 40p, LCX nl, OM1 40, RCA dominant 30p, 70d-->Med Rx.  Marland Kitchen Poor balance   . Presence of permanent cardiac pacemaker   . Sleep apnea    a. cpap  . Vertigo   . WPW (Wolff-Parkinson-White syndrome)    a. S/P RFCA 1991.    Past Surgical History:  Procedure Laterality Date  . ABDOMINAL AORTIC ENDOVASCULAR STENT GRAFT  08/25/2019   Procedure: ABDOMINAL AORTIC ENDOVASCULAR STENT GRAFT;  Surgeon: Algernon Huxley, MD;  Location: [facility]  ORS;  Service: Vascular;;  . ANGIOPLASTY Left 08/25/2019   Procedure: ANGIOPLASTY;  Surgeon: Algernon Huxley, MD;  Location: [facility] ORS;  Service: Vascular;  Laterality: Left;  left SFA and stent placement  . APPLICATION OF WOUND VAC Left 06/07/2015   Procedure: APPLICATION OF WOUND VAC;  Surgeon: Robert Bellow, MD;  Location: [facility] ORS;  Service: General;  Laterality: Left;  left upper back  . BACK SURGERY     2011  . CARDIAC CATHETERIZATION  08/26/2014   Single vessel obstructive CAD  . CARPAL TUNNEL RELEASE  04-04-15   [facility]  . CATARACT EXTRACTION  07-31-11 and 09-18-11  . Catheter ablation  1991   for WPW  . cervical fusion    . CHOLECYSTECTOMY  09-07-14  . ENDARTERECTOMY FEMORAL Left 08/25/2019   Procedure: ENDARTERECTOMY FEMORAL;  Surgeon: Algernon Huxley, MD;  Location: [facility] ORS;  Service: Vascular;  Laterality: Left;  common and produndis   . ENDOVASCULAR REPAIR/STENT GRAFT Right 08/25/2019   Procedure: ENDOVASCULAR REPAIR/STENT GRAFT;  Surgeon: Algernon Huxley, MD;  Location: [facility] ORS;  Service: Vascular;  Laterality: Right;  renal artery  . HAND SURGERY     right 1993;  left 2005  . HERNIA REPAIR  1955  . INSERT / REPLACE / REMOVE PACEMAKER    . INSERTION OF ILIAC STENT Bilateral 08/25/2019   Procedure: INSERTION OF ILIAC STENT;  Surgeon: Algernon Huxley, MD;  Location: [facility] ORS;  Service: Vascular;  Laterality: Bilateral;  . JOINT REPLACEMENT Left 2013   knee  . JOINT REPLACEMENT Right 2004   knee  . KNEE SURGERY     left knee 1991 and 1992; right knee 1995  . LEFT HEART CATHETERIZATION WITH CORONARY ANGIOGRAM N/A 08/26/2014   Procedure: LEFT HEART CATHETERIZATION WITH CORONARY ANGIOGRAM;  Surgeon: [name], MD;  Location: [facility] CATH LAB;  Service: Cardiovascular;  Laterality: N/A;  . LOWER EXTREMITY ANGIOGRAPHY Left 08/23/2019   Procedure: Lower Extremity Angiography;  Surgeon: Algernon Huxley, MD;  Location: [place] CV LAB;  Service: Cardiovascular;  Laterality: Left;  . LUMBAR  LAMINECTOMY/DECOMPRESSION MICRODISCECTOMY N/A 06/07/2014   Procedure: LUMBAR FOUR TO FIVE LUMBAR LAMINECTOMY/DECOMPRESSION MICRODISCECTOMY 1 LEVEL;  Surgeon: [name], MD;  Location: [place] NEURO ORS;  Service: Neurosurgery;  Laterality: N/A;  . LUNG BIOPSY Right 2016   Dr [name]  . PACEMAKER INSERTION     PPM-- St Jude 11/30/10 by [name]  . PPM GENERATOR CHANGEOUT N/A 07/09/2019   Procedure: PPM GENERATOR CHANGEOUT;  Surgeon: Deboraha Sprang, MD;  Location: [place] CV LAB;  Service: Cardiovascular;  Laterality: N/A;  . PROSTATE SURGERY     cancer--1998, prostatectomy  . REPLACEMENT TOTAL KNEE     2004  . ruptured disc     1962 and 1998  . TEE WITHOUT CARDIOVERSION N/A 09/01/2019   Procedure: TRANSESOPHAGEAL ECHOCARDIOGRAM (TEE);  Surgeon: [name], MD;  Location: [facility] ORS;  Service: Cardiovascular;  Laterality: N/A;  . TEMPORARY PACEMAKER N/A 07/09/2019   Procedure: TEMPORARY PACEMAKER;  Surgeon: Deboraha Sprang, MD;  Location: [place] CV LAB;  Service: Cardiovascular;  Laterality: N/A;  . TRIGGER FINGER RELEASE  01-24-15  . WOUND DEBRIDEMENT Left 06/07/2015   Procedure: DEBRIDEMENT WOUND;  Surgeon: Robert Bellow, MD;  Location: [facility] ORS;  Service: General;  Laterality: Left;  left upper back    There were no vitals filed for this visit.  Subjective Assessment - 05/26/20 0906    Subjective  Patient forgot his crutch in the car this am. Is having no pain this am. Reports some compliance with HEP but that his dog has been sick so he has been distracted    Pertinent History  Pt is a 86 y/o male familiar with this clinic, that presents to therapy with gait and strength deficits since vascular surgery in Sept 2020. Patient had difficulty ambulating prior to this for a "few months", and eventually was hospitalized for cellulitis of his L toe d/t decreased blood flow. Following vascular surgery in Sept 2020, patient had HHPT for 6 weeks and was doing "pretty well". Reports over the month  he has had insideous onset of increased L foot swelling and decreased balance with gait. He notes that his swelling is down today (visably not swollen), but that his feet have been swelling at night, and that is made somewhat better by propping it up; denies any pain. Patient has been using a SPC for "years" but ambulates into clinic today with loftstrand cruch in [place], which he reports someone gave to him recently, and he thinks this is helping his balance. Patient has a history of DM2 with periphreal neuropathy in L foot, reporting deminished sensation "all over"  the L foot. Denies any falls in the past 6 months, but reports his wife thinks he is very unstable. Prior to surgery patient was walking daily, but reports he has not done this in some time. Would like to return to walking "a few times a week", decrease swelling in his L foot, and increase his balance as he does report he has to catch himself often when he is entering the 3 steps into his home (bilat handrails) and has been furniture walking recently. Pt denies N/V, B&B changes, unexplained weight fluctuation, saddle paresthesia, fever, night sweats, or unrelenting night pain at this time.    Limitations  Lifting;Walking;House hold activities    How long can you sit comfortably?  Unlimited    How long can you stand comfortably?  unlmited    How long can you walk comfortably?  77mins    Patient Stated Goals  Walk without having to grab onto things    Pain Onset  More than a month ago       Ther-Ex Nustep L3 LEs only seat setting 8 19mins with cuing to keep >60spm with good compliance with encouragement Alt foot taps on 6in cone 3x 12 with CGA and treadmill bar nearby with cuing for foot clearance and to decrease speed with good carry over Side stepping 52ft R 42ft L; with YTB x2 with supervision for safety, and cuing for foot clearance with good carry over Ball toss/catch with PT tossing ball slightly outside BOS for dynamic balance challenge  x12; on foam 2x 12 with good use of ankle strategy, chair behind for safety Squat to chair 3x 8/7/6 with heavy push through RLE with R lateral lean, TC of pressure to L hip with cuing to "push into therapist hand" good carry over, fatigue Standing hip abd unilateral UE support YTB 2x 10 each LE (HEP update)                     PT Education - 05/26/20 0907    Education Details  Therex form/technique    Person(s) Educated  Patient    Methods  Explanation;Demonstration;Verbal cues    Comprehension  Verbalized understanding;Returned demonstration;Verbal cues required       PT Short Term Goals - 05/24/20 1237      PT SHORT TERM GOAL #1   Title  Pt will be independent with HEP in order to improve strength and balance in order to decrease fall risk and improve function at home and work.    Baseline  06/12/20 HEP given    Time  4    Period  Weeks    Status  New      PT SHORT TERM GOAL #2   Title  Pt will improve BERG by at least 3 points in order to demonstrate clinically significant improvement in balance.    Baseline  05/24/20 38/56    Time  5    Period  Weeks    Status  New      PT SHORT TERM GOAL #3   Title  Pt will improve DGI by at least 3 points in order to demonstrate clinically significant improvement in balance and decreased risk for falls.    Baseline  05/24/20 16/24    Time  5    Status  New        PT Long Term Goals - 05/24/20 1247      PT LONG TERM GOAL #1   Title  Pt will demonstrate gait  speed of 1.26m/s or faster to demonstrate normal community walk speed    Baseline  05/24/20 0.66m/s    Time  8    Period  Weeks    Status  New      PT LONG TERM GOAL #2   Title  Pt will demonstrate FGA score of at least 22/30 to demonstrate least fall risk in community    Baseline  05/24/20 13/30    Time  8    Period  Weeks    Status  New      PT LONG TERM GOAL #3   Title  Pt will demonstrate BERG score of at least 46/56 to demonstrate decreased community fall  risk    Baseline  05/24/20 38/56    Time  8    Period  Weeks    Status  New      PT LONG TERM GOAL #4   Title  Pt will decrease 5TSTS to at least 14.8 seconds in order to demonstrate clinically significant improvement and age matched norm in LE strength.    Baseline  05/24/20 18sec    Time  8    Period  Weeks    Status  New      PT LONG TERM GOAL #5   Title  Pt will decrease TUG to below 12 seconds in order to demonstrate decreased fall risk.    Baseline  05/24/20 14sec    Time  8    Period  Weeks            Plan - 05/26/20 0926    Clinical Impression Statement  PT initiated therex progression for dynamic balance and LE strength with good success. Patient is able to comply with all cuing for proper technique of therex, and is able to complete all planned interventons with gaurding needed for safety. PT will continue progression as able.    Personal Factors and Comorbidities  Age;Comorbidity 1;Comorbidity 3+;Comorbidity 2;Past/Current Experience;Fitness    Comorbidities  DM2 with periphreal nueropathy, GERD, HTN, HLD, past lung cancer, arthritis, vertigo, PAD    Examination-Activity Limitations  Stairs;Carry;Squat;Locomotion Level;Transfers    Examination-Participation Restrictions  Cleaning;Yard Work;Community Activity;Church    Stability/Clinical Decision Making  Evolving/Moderate complexity    Clinical Decision Making  Moderate    Clinical Impairments Affecting Rehab Potential  (+) social support, motivation (-) age, chronicity of pain, other comorbidities, multiple failed attempts at pain relief (injection, sx, past PT)    PT Frequency  2x / week    PT Duration  8 weeks    PT Treatment/Interventions  ADLs/Self Care Home Management;Electrical Stimulation;Therapeutic activities;Patient/family education;Therapeutic exercise;DME Instruction;Gait training;Stair training;Moist Heat;Cryotherapy;Ultrasound;Aquatic Therapy;Functional mobility training;Manual techniques;Dry needling;Passive  range of motion;Neuromuscular re-education;Balance training;Vestibular;Joint Manipulations;Spinal Manipulations;Energy conservation    PT Next Visit Plan  6MWT?, dynamic balance, tband to HEP    PT Home Exercise Plan  STS, standing hip abd, stagger stance    Consulted and Agree with Plan of Care  Patient       Patient will benefit from skilled therapeutic intervention in order to improve the following deficits and impairments:  Abnormal gait, Dizziness, Impaired sensation, Improper body mechanics, Postural dysfunction, Decreased mobility, Decreased coordination, Decreased activity tolerance, Decreased endurance, Decreased strength, Decreased balance, Difficulty walking  Visit Diagnosis: Difficulty in walking, not elsewhere classified  Muscle weakness (generalized)  Dizziness and giddiness     Problem List Patient Active Problem List   Diagnosis Date Noted  . AAA (abdominal aortic aneurysm) without rupture ([place]) 12/10/2019  . Renal  artery stenosis ([place]) 12/10/2019  . PAD (peripheral artery disease) ([place]) 12/10/2019  . Thromboembolism ([place]) 08/30/2019  . Atherosclerosis of native arteries of extremity with intermittent claudication ([place]) 08/23/2019  . Cellulitis 08/22/2019  . Swelling of limb 08/17/2019  . Pain in limb 08/17/2019  . NICM (nonischemic cardiomyopathy) ([place]) 07/01/2019  . CHF (congestive heart failure) ([place]) 07/01/2019  . Malignant neoplasm of right lung ([place]) 04/18/2016  . CAD (coronary artery disease) 04/01/2016  . Adenocarcinoma ([place]) 04/01/2016  . Skin cyst 12/26/2015  . GERD (gastroesophageal reflux disease) 06/16/2015  . Abscess of back 06/06/2015  . Vertigo 03/27/2015  . Carpal tunnel syndrome 10/28/2014  . Status post cholecystectomy 09/29/2014  . Disease of digestive tract 09/29/2014  . Cardiomyopathy ([place]) 09/26/2014  . Type 2 diabetes mellitus without complications ([place])   . Sleep apnea   . Spinal stenosis, lumbar region, with neurogenic  claudication 06/07/2014  . Lumbar stenosis with neurogenic claudication 06/07/2014  . Abnormal gait 08/20/2012  . H/O total knee replacement 08/20/2012  . Arthritis of knee, degenerative 08/20/2012  . Pacemaker-St.Jude 08/03/2012  . Cardiac conduction disorder 06/19/2012  . Acid reflux 06/18/2012  . 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 Triggering of digit 03/25/2012  . Hyperlipidemia 12/23/2011  . Essential hypertension 03/23/2011  . Atrioventricular block, complete ([place]) 03/23/2011  . Complete atrioventricular block ([place]) 03/23/2011   Shelton Silvas PT, DPT Shelton Silvas 05/26/2020, 9:37 AM  [facility] [address], [place], [address] Phone: [phone]   Fax:  [phone]  Name: NAJI MEHRINGER MRN: [id number] Date of Birth: 1931-07-28

## 2020-05-29 ENCOUNTER — Ambulatory Visit: Payer: Medicare Other | Admitting: Physical Therapy

## 2020-05-29 ENCOUNTER — Encounter: Payer: Self-pay | Admitting: Physical Therapy

## 2020-05-29 ENCOUNTER — Other Ambulatory Visit: Payer: Self-pay

## 2020-05-29 DIAGNOSIS — M6281 Muscle weakness (generalized): Secondary | ICD-10-CM

## 2020-05-29 DIAGNOSIS — R42 Dizziness and giddiness: Secondary | ICD-10-CM

## 2020-05-29 DIAGNOSIS — R262 Difficulty in walking, not elsewhere classified: Secondary | ICD-10-CM

## 2020-05-29 NOTE — Therapy (Signed)
[facility] [address] [place], [address] Phone: [phone]   Fax:  [phone]  Physical Therapy Treatment  Patient Details  Name: Howard [name] MRN: [id number] Date of Birth: 1931-09-26 No data recorded  Encounter Date: 05/29/2020  PT End of Session - 05/29/20 1623    Visit Number  3    Number of Visits  17    Date for PT Re-Evaluation  07/19/20    PT Start Time  0420    PT Stop Time  0500    PT Time Calculation (min)  40 min    Equipment Utilized During Treatment  Gait belt    Activity Tolerance  Patient tolerated treatment well    Behavior During Therapy  [facility] for tasks assessed/performed       Past Medical History:  Diagnosis Date  . Arthritis   . Bell palsy   . Cancer [facility])    prostate and skin  . Chronic combined systolic and diastolic CHF, NYHA class 1 ([place])    a. 07/2014 Echo: EF 35-40%, Gr 1 DD.  Marland Kitchen Complete heart block ([place])    a. 11/2010 s/p SJM 2210 [employer] DC PPM, ser# [id number].  . Depression   . Diabetes mellitus without complication ([place])   . Fall 11-10-14  . GERD (gastroesophageal reflux disease)   . History of prostate cancer   . Hyperlipidemia   . Hypertension   . LBBB (left bundle branch block)   . Left-sided Bell's palsy   . Lung cancer ([place]) 2016  . NICM (nonischemic cardiomyopathy) ([place])    a. 07/2014 Echo: EF 35-40%, mid-apicalanteroseptal DK, Gr 1 DD, mild-mod dil LA.  . Non-obstructive CAD    a. 07/2014 Abnl MV;  b. 08/2014 Cath: LM nl, LAD 30p, RI 40p, LCX nl, OM1 40, RCA dominant 30p, 70d-->Med Rx.  Marland Kitchen Poor balance   . Presence of permanent cardiac pacemaker   . Sleep apnea    a. cpap  . Vertigo   . WPW (Wolff-Parkinson-White syndrome)    a. S/P RFCA 1991.    Past Surgical History:  Procedure Laterality Date  . ABDOMINAL AORTIC ENDOVASCULAR STENT GRAFT  08/25/2019   Procedure: ABDOMINAL AORTIC ENDOVASCULAR STENT GRAFT;  Surgeon: Algernon Huxley, MD;  Location: [facility]  ORS;  Service: Vascular;;  . ANGIOPLASTY Left 08/25/2019   Procedure: ANGIOPLASTY;  Surgeon: Algernon Huxley, MD;  Location: [facility] ORS;  Service: Vascular;  Laterality: Left;  left SFA and stent placement  . APPLICATION OF WOUND VAC Left 06/07/2015   Procedure: APPLICATION OF WOUND VAC;  Surgeon: Robert Bellow, MD;  Location: [facility] ORS;  Service: General;  Laterality: Left;  left upper back  . BACK SURGERY     2011  . CARDIAC CATHETERIZATION  08/26/2014   Single vessel obstructive CAD  . CARPAL TUNNEL RELEASE  04-04-15   [facility]  . CATARACT EXTRACTION  07-31-11 and 09-18-11  . Catheter ablation  1991   for WPW  . cervical fusion    . CHOLECYSTECTOMY  09-07-14  . ENDARTERECTOMY FEMORAL Left 08/25/2019   Procedure: ENDARTERECTOMY FEMORAL;  Surgeon: Algernon Huxley, MD;  Location: [facility] ORS;  Service: Vascular;  Laterality: Left;  common and produndis   . ENDOVASCULAR REPAIR/STENT GRAFT Right 08/25/2019   Procedure: ENDOVASCULAR REPAIR/STENT GRAFT;  Surgeon: Algernon Huxley, MD;  Location: [facility] ORS;  Service: Vascular;  Laterality: Right;  renal artery  . HAND SURGERY     right 1993;  left 2005  . HERNIA REPAIR  1955  . INSERT / REPLACE / REMOVE PACEMAKER    . INSERTION OF ILIAC STENT Bilateral 08/25/2019   Procedure: INSERTION OF ILIAC STENT;  Surgeon: Algernon Huxley, MD;  Location: [facility] ORS;  Service: Vascular;  Laterality: Bilateral;  . JOINT REPLACEMENT Left 2013   knee  . JOINT REPLACEMENT Right 2004   knee  . KNEE SURGERY     left knee 1991 and 1992; right knee 1995  . LEFT HEART CATHETERIZATION WITH CORONARY ANGIOGRAM N/A 08/26/2014   Procedure: LEFT HEART CATHETERIZATION WITH CORONARY ANGIOGRAM;  Surgeon: [name], MD;  Location: [facility] CATH LAB;  Service: Cardiovascular;  Laterality: N/A;  . LOWER EXTREMITY ANGIOGRAPHY Left 08/23/2019   Procedure: Lower Extremity Angiography;  Surgeon: Algernon Huxley, MD;  Location: [facility] CV LAB;  Service: Cardiovascular;  Laterality: Left;  . LUMBAR  LAMINECTOMY/DECOMPRESSION MICRODISCECTOMY N/A 06/07/2014   Procedure: LUMBAR FOUR TO FIVE LUMBAR LAMINECTOMY/DECOMPRESSION MICRODISCECTOMY 1 LEVEL;  Surgeon: [name], MD;  Location: [facility] ORS;  Service: Neurosurgery;  Laterality: N/A;  . LUNG BIOPSY Right 2016   Dr [name]  . PACEMAKER INSERTION     PPM-- St Jude 11/30/10 by [name]  . PPM GENERATOR CHANGEOUT N/A 07/09/2019   Procedure: PPM GENERATOR CHANGEOUT;  Surgeon: Deboraha Sprang, MD;  Location: [place] CV LAB;  Service: Cardiovascular;  Laterality: N/A;  . PROSTATE SURGERY     cancer--1998, prostatectomy  . REPLACEMENT TOTAL KNEE     2004  . ruptured disc     1962 and 1998  . TEE WITHOUT CARDIOVERSION N/A 09/01/2019   Procedure: TRANSESOPHAGEAL ECHOCARDIOGRAM (TEE);  Surgeon: [name], MD;  Location: [facility] ORS;  Service: Cardiovascular;  Laterality: N/A;  . TEMPORARY PACEMAKER N/A 07/09/2019   Procedure: TEMPORARY PACEMAKER;  Surgeon: Deboraha Sprang, MD;  Location: [facility] CV LAB;  Service: Cardiovascular;  Laterality: N/A;  . TRIGGER FINGER RELEASE  01-24-15  . WOUND DEBRIDEMENT Left 06/07/2015   Procedure: DEBRIDEMENT WOUND;  Surgeon: Robert Bellow, MD;  Location: [facility] ORS;  Service: General;  Laterality: Left;  left upper back    There were no vitals filed for this visit.  Subjective Assessment - 05/29/20 1621    Subjective  Patient reports he forgot his crutch again today. Reports no falls over the weekend. Compliance with HEP, would like to review standing hip abd HEP.    Pertinent History  Pt is a 86 y/o male familiar with this clinic, that presents to therapy with gait and strength deficits since vascular surgery in Sept 2020. Patient had difficulty ambulating prior to this for a "few months", and eventually was hospitalized for cellulitis of his L toe d/t decreased blood flow. Following vascular surgery in Sept 2020, patient had HHPT for 6 weeks and was doing "pretty well". Reports over the month he has had  insideous onset of increased L foot swelling and decreased balance with gait. He notes that his swelling is down today (visably not swollen), but that his feet have been swelling at night, and that is made somewhat better by propping it up; denies any pain. Patient has been using a SPC for "years" but ambulates into clinic today with loftstrand cruch in [place], which he reports someone gave to him recently, and he thinks this is helping his balance. Patient has a history of DM2 with periphreal neuropathy in L foot, reporting deminished sensation "all over" the L foot. Denies any falls in  the past 6 months, but reports his wife thinks he is very unstable. Prior to surgery patient was walking daily, but reports he has not done this in some time. Would like to return to walking "a few times a week", decrease swelling in his L foot, and increase his balance as he does report he has to catch himself often when he is entering the 3 steps into his home (bilat handrails) and has been furniture walking recently. Pt denies N/V, B&B changes, unexplained weight fluctuation, saddle paresthesia, fever, night sweats, or unrelenting night pain at this time.    Limitations  Lifting;Walking;House hold activities    How long can you sit comfortably?  Unlimited    How long can you stand comfortably?  unlmited    How long can you walk comfortably?  92mins    Patient Stated Goals  Walk without having to grab onto things    Pain Onset  More than a month ago       Ther-Ex Nustep L3 LEs only seat setting 8 26mins with cuing to keep >60spm with good compliance with encouragement Standing hip abd without TB x10 with ease; with YTB attempt, too easy; with RTB 2x 10 bilat with min cuing for posture with good carry over Squat to chair 3x 8 with better carry over from last session, min cuing initially for glute contraction and even WB but much better overall Ball toss/catch with PT tossing ball slightly outside BOS for dynamic balance  challenge on foam 2x 12 with good use of ankle strategy, chair behind for safety Modified tandem walking 2x 73ft; tandem walking 2x 10 with CGA for safety Kicking soccer ball from pass x10 LE with unilateral HH support on rail; without UE support 2x 10 each LE with supervision/CGA for safety                            PT Education - 05/29/20 1622    Education Details  Therex form/technique    Person(s) Educated  Patient    Methods  Explanation;Demonstration;Verbal cues    Comprehension  Verbalized understanding;Returned demonstration;Verbal cues required       PT Short Term Goals - 05/24/20 1237      PT SHORT TERM GOAL #1   Title  Pt will be independent with HEP in order to improve strength and balance in order to decrease fall risk and improve function at home and work.    Baseline  06/12/20 HEP given    Time  4    Period  Weeks    Status  New      PT SHORT TERM GOAL #2   Title  Pt will improve BERG by at least 3 points in order to demonstrate clinically significant improvement in balance.    Baseline  05/24/20 38/56    Time  5    Period  Weeks    Status  New      PT SHORT TERM GOAL #3   Title  Pt will improve DGI by at least 3 points in order to demonstrate clinically significant improvement in balance and decreased risk for falls.    Baseline  05/24/20 16/24    Time  5    Status  New        PT Long Term Goals - 05/24/20 1247      PT LONG TERM GOAL #1   Title  Pt will demonstrate gait speed of 1.59m/s or faster  to demonstrate normal community walk speed    Baseline  05/24/20 0.40m/s    Time  8    Period  Weeks    Status  New      PT LONG TERM GOAL #2   Title  Pt will demonstrate FGA score of at least 22/30 to demonstrate least fall risk in community    Baseline  05/24/20 13/30    Time  8    Period  Weeks    Status  New      PT LONG TERM GOAL #3   Title  Pt will demonstrate BERG score of at least 46/56 to demonstrate decreased community fall  risk    Baseline  05/24/20 38/56    Time  8    Period  Weeks    Status  New      PT LONG TERM GOAL #4   Title  Pt will decrease 5TSTS to at least 14.8 seconds in order to demonstrate clinically significant improvement and age matched norm in LE strength.    Baseline  05/24/20 18sec    Time  8    Period  Weeks    Status  New      PT LONG TERM GOAL #5   Title  Pt will decrease TUG to below 12 seconds in order to demonstrate decreased fall risk.    Baseline  05/24/20 14sec    Time  8    Period  Weeks            Plan - 05/29/20 1642    Clinical Impression Statement  PT continued therex therex for dynamic balance and BLE strengthening with good success. Patient is able to complete therex with good carry over of cuing for technique and proper muscle activation. Pt requires gaurding throughout session for safety, but is able to complete therex safely. PT will continue progression as able.    Personal Factors and Comorbidities  Age;Comorbidity 1;Comorbidity 3+;Comorbidity 2;Past/Current Experience;Fitness    Comorbidities  DM2 with periphreal nueropathy, GERD, HTN, HLD, past lung cancer, arthritis, vertigo, PAD    Examination-Activity Limitations  Stairs;Carry;Squat;Locomotion Level;Transfers    Examination-Participation Restrictions  Cleaning;Yard Work;Community Activity;Church    Stability/Clinical Decision Making  Evolving/Moderate complexity    Clinical Decision Making  Moderate    Rehab Potential  Good    Clinical Impairments Affecting Rehab Potential  (+) social support, motivation (-) age, chronicity of pain, other comorbidities, multiple failed attempts at pain relief (injection, sx, past PT)    PT Frequency  2x / week    PT Duration  8 weeks    PT Treatment/Interventions  ADLs/Self Care Home Management;Electrical Stimulation;Therapeutic activities;Patient/family education;Therapeutic exercise;DME Instruction;Gait training;Stair training;Moist Heat;Cryotherapy;Ultrasound;Aquatic  Therapy;Functional mobility training;Manual techniques;Dry needling;Passive range of motion;Neuromuscular re-education;Balance training;Vestibular;Joint Manipulations;Spinal Manipulations;Energy conservation    PT Next Visit Plan  dynamic balance, tband to HEP    PT Home Exercise Plan  STS, standing hip abd, stagger stance    Consulted and Agree with Plan of Care  Patient       Patient will benefit from skilled therapeutic intervention in order to improve the following deficits and impairments:  Abnormal gait, Dizziness, Impaired sensation, Improper body mechanics, Postural dysfunction, Decreased mobility, Decreased coordination, Decreased activity tolerance, Decreased endurance, Decreased strength, Decreased balance, Difficulty walking  Visit Diagnosis: Difficulty in walking, not elsewhere classified  Muscle weakness (generalized)  Dizziness and giddiness     Problem List Patient Active Problem List   Diagnosis Date Noted  . AAA (abdominal aortic aneurysm)  without rupture ([place]) 12/10/2019  . Renal artery stenosis ([place]) 12/10/2019  . PAD (peripheral artery disease) ([place]) 12/10/2019  . Thromboembolism ([place]) 08/30/2019  . Atherosclerosis of native arteries of extremity with intermittent claudication ([place]) 08/23/2019  . Cellulitis 08/22/2019  . Swelling of limb 08/17/2019  . Pain in limb 08/17/2019  . NICM (nonischemic cardiomyopathy) ([place]) 07/01/2019  . CHF (congestive heart failure) ([place]) 07/01/2019  . Malignant neoplasm of right lung ([place]) 04/18/2016  . CAD (coronary artery disease) 04/01/2016  . Adenocarcinoma ([place]) 04/01/2016  . Skin cyst 12/26/2015  . GERD (gastroesophageal reflux disease) 06/16/2015  . Abscess of back 06/06/2015  . Vertigo 03/27/2015  . Carpal tunnel syndrome 10/28/2014  . Status post cholecystectomy 09/29/2014  . Disease of digestive tract 09/29/2014  . Cardiomyopathy ([place]) 09/26/2014  . Type 2 diabetes mellitus without complications ([place])   . Sleep  apnea   . Spinal stenosis, lumbar region, with neurogenic claudication 06/07/2014  . Lumbar stenosis with neurogenic claudication 06/07/2014  . Abnormal gait 08/20/2012  . H/O total knee replacement 08/20/2012  . Arthritis of knee, degenerative 08/20/2012  . Pacemaker-St.Jude 08/03/2012  . Cardiac conduction disorder 06/19/2012  . Acid reflux 06/18/2012  . 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 Triggering of digit 03/25/2012  . Hyperlipidemia 12/23/2011  . Essential hypertension 03/23/2011  . Atrioventricular block, complete ([place]) 03/23/2011  . Complete atrioventricular block ([place]) 03/23/2011   Durwin Reges DPT Durwin Reges 05/29/2020, 4:58 PM  [facility] [address] [place], [address] Phone: [phone]   Fax:  [phone]  Name: Howard [name] MRN: [id number] Date of Birth: 1931-06-21

## 2020-05-30 NOTE — Telephone Encounter (Signed)
Spoke with patients wife per release form and she did receive this information as well from Dr. Natale Milch. She verbalized understanding with no further questions at this time.

## 2020-05-31 ENCOUNTER — Other Ambulatory Visit: Payer: Self-pay

## 2020-05-31 ENCOUNTER — Ambulatory Visit: Payer: Medicare Other | Admitting: Physical Therapy

## 2020-05-31 ENCOUNTER — Encounter: Payer: Self-pay | Admitting: Physical Therapy

## 2020-05-31 DIAGNOSIS — R262 Difficulty in walking, not elsewhere classified: Secondary | ICD-10-CM

## 2020-05-31 DIAGNOSIS — R42 Dizziness and giddiness: Secondary | ICD-10-CM

## 2020-05-31 DIAGNOSIS — M6281 Muscle weakness (generalized): Secondary | ICD-10-CM

## 2020-05-31 DIAGNOSIS — R296 Repeated falls: Secondary | ICD-10-CM

## 2020-05-31 NOTE — Therapy (Signed)
[facility] [address], [place], [address] Phone: [phone]   Fax:  [phone]  Physical Therapy Treatment  Patient Details  Name: Howard [name] MRN: [id number] Date of Birth: 1931-02-15 No data recorded  Encounter Date: 05/31/2020  PT End of Session - 05/31/20 1103    Visit Number  4    Number of Visits  17    Date for PT Re-Evaluation  07/19/20    Authorization - Visit Number  4    Authorization - Number of Visits  10    PT Start Time  7654    PT Stop Time  1100    PT Time Calculation (min)  45 min    Equipment Utilized During Treatment  Gait belt    Activity Tolerance  Patient tolerated treatment well    Behavior During Therapy  [facility] for tasks assessed/performed       Past Medical History:  Diagnosis Date  . Arthritis   . Bell palsy   . Cancer [facility])    prostate and skin  . Chronic combined systolic and diastolic CHF, NYHA class 1 ([place])    a. 07/2014 Echo: EF 35-40%, Gr 1 DD.  Marland Kitchen Complete heart block ([place])    a. 11/2010 s/p SJM 2210 [employer] DC PPM, ser# [id number].  . Depression   . Diabetes mellitus without complication ([place])   . Fall 11-10-14  . GERD (gastroesophageal reflux disease)   . History of prostate cancer   . Hyperlipidemia   . Hypertension   . LBBB (left bundle branch block)   . Left-sided Bell's palsy   . Lung cancer ([place]) 2016  . NICM (nonischemic cardiomyopathy) ([place])    a. 07/2014 Echo: EF 35-40%, mid-apicalanteroseptal DK, Gr 1 DD, mild-mod dil LA.  . Non-obstructive CAD    a. 07/2014 Abnl MV;  b. 08/2014 Cath: LM nl, LAD 30p, RI 40p, LCX nl, OM1 40, RCA dominant 30p, 70d-->Med Rx.  Marland Kitchen Poor balance   . Presence of permanent cardiac pacemaker   . Sleep apnea    a. cpap  . Vertigo   . WPW (Wolff-Parkinson-White syndrome)    a. S/P RFCA 1991.    Past Surgical History:  Procedure Laterality Date  . ABDOMINAL AORTIC ENDOVASCULAR STENT GRAFT  08/25/2019   Procedure: ABDOMINAL  AORTIC ENDOVASCULAR STENT GRAFT;  Surgeon: Algernon Huxley, MD;  Location: [facility] ORS;  Service: Vascular;;  . ANGIOPLASTY Left 08/25/2019   Procedure: ANGIOPLASTY;  Surgeon: Algernon Huxley, MD;  Location: [facility] ORS;  Service: Vascular;  Laterality: Left;  left SFA and stent placement  . APPLICATION OF WOUND VAC Left 06/07/2015   Procedure: APPLICATION OF WOUND VAC;  Surgeon: Robert Bellow, MD;  Location: [facility] ORS;  Service: General;  Laterality: Left;  left upper back  . BACK SURGERY     2011  . CARDIAC CATHETERIZATION  08/26/2014   Single vessel obstructive CAD  . CARPAL TUNNEL RELEASE  04-04-15   [facility]  . CATARACT EXTRACTION  07-31-11 and 09-18-11  . Catheter ablation  1991   for WPW  . cervical fusion    . CHOLECYSTECTOMY  09-07-14  . ENDARTERECTOMY FEMORAL Left 08/25/2019   Procedure: ENDARTERECTOMY FEMORAL;  Surgeon: Algernon Huxley, MD;  Location: [facility] ORS;  Service: Vascular;  Laterality: Left;  common and produndis   . ENDOVASCULAR REPAIR/STENT GRAFT Right 08/25/2019   Procedure: ENDOVASCULAR REPAIR/STENT GRAFT;  Surgeon: Algernon Huxley, MD;  Location: [facility] ORS;  Service: Vascular;  Laterality: Right;  renal artery  . HAND SURGERY     right 1993; left 2005  . HERNIA REPAIR  1955  . INSERT / REPLACE / REMOVE PACEMAKER    . INSERTION OF ILIAC STENT Bilateral 08/25/2019   Procedure: INSERTION OF ILIAC STENT;  Surgeon: Algernon Huxley, MD;  Location: [facility] ORS;  Service: Vascular;  Laterality: Bilateral;  . JOINT REPLACEMENT Left 2013   knee  . JOINT REPLACEMENT Right 2004   knee  . KNEE SURGERY     left knee 1991 and 1992; right knee 1995  . LEFT HEART CATHETERIZATION WITH CORONARY ANGIOGRAM N/A 08/26/2014   Procedure: LEFT HEART CATHETERIZATION WITH CORONARY ANGIOGRAM;  Surgeon: [name], MD;  Location: [facility] CATH LAB;  Service: Cardiovascular;  Laterality: N/A;  . LOWER EXTREMITY ANGIOGRAPHY Left 08/23/2019   Procedure: Lower Extremity Angiography;  Surgeon: Algernon Huxley, MD;  Location: [place] CV  LAB;  Service: Cardiovascular;  Laterality: Left;  . LUMBAR LAMINECTOMY/DECOMPRESSION MICRODISCECTOMY N/A 06/07/2014   Procedure: LUMBAR FOUR TO FIVE LUMBAR LAMINECTOMY/DECOMPRESSION MICRODISCECTOMY 1 LEVEL;  Surgeon: [name], MD;  Location: [place] [facility]O ORS;  Service: Neurosurgery;  Laterality: N/A;  . LUNG BIOPSY Right 2016   Dr [name]  . PACEMAKER INSERTION     PPM-- St Jude 11/30/10 by [name]  . PPM GENERATOR CHANGEOUT N/A 07/09/2019   Procedure: PPM GENERATOR CHANGEOUT;  Surgeon: Deboraha Sprang, MD;  Location: [place] CV LAB;  Service: Cardiovascular;  Laterality: N/A;  . PROSTATE SURGERY     cancer--1998, prostatectomy  . REPLACEMENT TOTAL KNEE     2004  . ruptured disc     1962 and 1998  . TEE WITHOUT CARDIOVERSION N/A 09/01/2019   Procedure: TRANSESOPHAGEAL ECHOCARDIOGRAM (TEE);  Surgeon: [name], MD;  Location: [facility] ORS;  Service: Cardiovascular;  Laterality: N/A;  . TEMPORARY PACEMAKER N/A 07/09/2019   Procedure: TEMPORARY PACEMAKER;  Surgeon: Deboraha Sprang, MD;  Location: [place] CV LAB;  Service: Cardiovascular;  Laterality: N/A;  . TRIGGER FINGER RELEASE  01-24-15  . WOUND DEBRIDEMENT Left 06/07/2015   Procedure: DEBRIDEMENT WOUND;  Surgeon: Robert Bellow, MD;  Location: [facility] ORS;  Service: General;  Laterality: Left;  left upper back    There were no vitals filed for this visit.  Subjective Assessment - 05/31/20 1018    Subjective  Pt enters pt without crutch again today. Pt reports pain in L back. Compliance with HEP.    Pertinent History  Pt is a 86 y/o male familiar with this clinic, that presents to therapy with gait and strength deficits since vascular surgery in Sept 2020. Patient had difficulty ambulating prior to this for a "few months", and eventually was hospitalized for cellulitis of his L toe d/t decreased blood flow. Following vascular surgery in Sept 2020, patient had HHPT for 6 weeks and was doing "pretty well". Reports over the month he has had  insideous onset of increased L foot swelling and decreased balance with gait. He notes that his swelling is down today (visably not swollen), but that his feet have been swelling at night, and that is made somewhat better by propping it up; denies any pain. Patient has been using a SPC for "years" but ambulates into clinic today with loftstrand cruch in [place], which he reports someone gave to him recently, and he thinks this is helping his balance. Patient has a history of DM2 with periphreal neuropathy in L foot, reporting deminished sensation "  all over" the L foot. Denies any falls in the past 6 months, but reports his wife thinks he is very unstable. Prior to surgery patient was walking daily, but reports he has not done this in some time. Would like to return to walking "a few times a week", decrease swelling in his L foot, and increase his balance as he does report he has to catch himself often when he is entering the 3 steps into his home (bilat handrails) and has been furniture walking recently. Pt denies N/V, B&B changes, unexplained weight fluctuation, saddle paresthesia, fever, night sweats, or unrelenting night pain at this time.    Limitations  Lifting;Walking;House hold activities    How long can you sit comfortably?  Unlimited    How long can you stand comfortably?  unlmited    How long can you walk comfortably?  46mins    Patient Stated Goals  Walk without having to grab onto things    Pain Onset  More than a month ago       ThereEx Nustep L3 LEs only seat setting 8 52mins with cuing to keep >60spm with good compliance with encouragement Standing hip abduction with 3# ankle weights 2x10 B Standing hip extension with 3# ankle weights 2x10 B Alternating stair taps onto 6" step with no UE support 2x10 Stair taps onto 6" step anterior and crossing midline for prolonged SLS with unilat UE support 1x10 B Stair taps onto 6" step on foam with unilat UE support 2x10 Standing with UE reaching for  cones at various heights across midline 1x10 B on stable surface, 1x10 B on foam     PT Education - 05/31/20 1021    Education Details  Therex form/technique    Person(s) Educated  Patient    Methods  Explanation;Verbal cues;Demonstration    Comprehension  Returned demonstration;Verbalized understanding;Verbal cues required       PT Short Term Goals - 05/24/20 1237      PT SHORT TERM GOAL #1   Title  Pt will be independent with HEP in order to improve strength and balance in order to decrease fall risk and improve function at home and work.    Baseline  06/12/20 HEP given    Time  4    Period  Weeks    Status  New      PT SHORT TERM GOAL #2   Title  Pt will improve BERG by at least 3 points in order to demonstrate clinically significant improvement in balance.    Baseline  05/24/20 38/56    Time  5    Period  Weeks    Status  New      PT SHORT TERM GOAL #3   Title  Pt will improve DGI by at least 3 points in order to demonstrate clinically significant improvement in balance and decreased risk for falls.    Baseline  05/24/20 16/24    Time  5    Status  New        PT Long Term Goals - 05/24/20 1247      PT LONG TERM GOAL #1   Title  Pt will demonstrate gait speed of 1.59m/s or faster to demonstrate normal community walk speed    Baseline  05/24/20 0.55m/s    Time  8    Period  Weeks    Status  New      PT LONG TERM GOAL #2   Title  Pt will demonstrate FGA score of  at least 22/30 to demonstrate least fall risk in community    Baseline  05/24/20 13/30    Time  8    Period  Weeks    Status  New      PT LONG TERM GOAL #3   Title  Pt will demonstrate BERG score of at least 46/56 to demonstrate decreased community fall risk    Baseline  05/24/20 38/56    Time  8    Period  Weeks    Status  New      PT LONG TERM GOAL #4   Title  Pt will decrease 5TSTS to at least 14.8 seconds in order to demonstrate clinically significant improvement and age matched norm in LE strength.     Baseline  05/24/20 18sec    Time  8    Period  Weeks    Status  New      PT LONG TERM GOAL #5   Title  Pt will decrease TUG to below 12 seconds in order to demonstrate decreased fall risk.    Baseline  05/24/20 14sec    Time  8    Period  Weeks            Plan - 05/31/20 1110    Clinical Impression Statement  PT continued therex for dynamic balance and BLE strengthening with good success.  Pt requires freuquent cueing for upright posture with standing strengthening exercises and dynamic balance exercises.  Pt requires guarding throughout session for safety, but is able to complete therex safely with no LOB.  Pt will continue to benefit from skilled therapy to increase BLE strength and improve balance.    Personal Factors and Comorbidities  Age;Comorbidity 1;Comorbidity 3+;Comorbidity 2;Past/Current Experience;Fitness    Comorbidities  DM2 with periphreal nueropathy, GERD, HTN, HLD, past lung cancer, arthritis, vertigo, PAD    Examination-Activity Limitations  Stairs;Carry;Squat;Locomotion Level;Transfers    Examination-Participation Restrictions  Cleaning;[place] Work;Community Activity;Church    Stability/Clinical Decision Making  Evolving/Moderate complexity    Clinical Decision Making  Moderate    Rehab Potential  Good    Clinical Impairments Affecting Rehab Potential  (+) social support, motivation (-) age, chronicity of pain, other comorbidities, multiple failed attempts at pain relief (injection, sx, past PT)    PT Frequency  2x / week    PT Duration  8 weeks    PT Treatment/Interventions  ADLs/Self Care Home Management;Electrical Stimulation;Therapeutic activities;Patient/family education;Therapeutic exercise;DME Instruction;Gait training;Stair training;Moist Heat;Cryotherapy;Ultrasound;Aquatic Therapy;Functional mobility training;Manual techniques;Dry needling;Passive range of motion;Neuromuscular re-education;Balance training;Vestibular;Joint Manipulations;Spinal Manipulations;Energy  conservation    PT Next Visit Plan  dynamic balance, tband to HEP    PT Home Exercise Plan  STS, standing hip abd, stagger stance    Consulted and Agree with Plan of Care  Patient       Patient will benefit from skilled therapeutic intervention in order to improve the following deficits and impairments:  Abnormal gait, Dizziness, Impaired sensation, Improper body mechanics, Postural dysfunction, Decreased mobility, Decreased coordination, Decreased activity tolerance, Decreased endurance, Decreased strength, Decreased balance, Difficulty walking  Visit Diagnosis: Difficulty in walking, not elsewhere classified  Muscle weakness (generalized)  Dizziness and giddiness  Repeated falls     Problem List Patient Active Problem List   Diagnosis Date Noted  . AAA (abdominal aortic aneurysm) without rupture ([place]) 12/10/2019  . Renal artery stenosis ([place]) 12/10/2019  . PAD (peripheral artery disease) ([place]) 12/10/2019  . Thromboembolism ([place]) 08/30/2019  . Atherosclerosis of native arteries of extremity with intermittent claudication ([place]) 08/23/2019  .  Cellulitis 08/22/2019  . Swelling of limb 08/17/2019  . Pain in limb 08/17/2019  . NICM (nonischemic cardiomyopathy) ([place]) 07/01/2019  . CHF (congestive heart failure) ([place]) 07/01/2019  . Malignant neoplasm of right lung ([place]) 04/18/2016  . CAD (coronary artery disease) 04/01/2016  . Adenocarcinoma ([place]) 04/01/2016  . Skin cyst 12/26/2015  . GERD (gastroesophageal reflux disease) 06/16/2015  . Abscess of back 06/06/2015  . Vertigo 03/27/2015  . Carpal tunnel syndrome 10/28/2014  . Status post cholecystectomy 09/29/2014  . Disease of digestive tract 09/29/2014  . Cardiomyopathy ([place]) 09/26/2014  . Type 2 diabetes mellitus without complications ([place])   . Sleep apnea   . Spinal stenosis, lumbar region, with neurogenic claudication 06/07/2014  . Lumbar stenosis with neurogenic claudication 06/07/2014  . Abnormal gait 08/20/2012  .  H/O total knee replacement 08/20/2012  . Arthritis of knee, degenerative 08/20/2012  . Pacemaker-St.Jude 08/03/2012  . Cardiac conduction disorder 06/19/2012  . Acid reflux 06/18/2012  . 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 Triggering of digit 03/25/2012  . Hyperlipidemia 12/23/2011  . Essential hypertension 03/23/2011  . Atrioventricular block, complete ([place]) 03/23/2011  . Complete atrioventricular block ([place]) 03/23/2011   Durwin Reges DPT  Durwin Reges, SPT 05/31/2020, 12:06 PM  [facility] [address] [place], [address] Phone: [phone]   Fax:  [phone]  Name: Howard [name] MRN: [id number] Date of Birth: 1931/05/03

## 2020-06-05 ENCOUNTER — Inpatient Hospital Stay: Payer: Medicare Other | Attending: Radiation Oncology

## 2020-06-05 ENCOUNTER — Ambulatory Visit
Admission: RE | Admit: 2020-06-05 | Discharge: 2020-06-05 | Disposition: A | Discharge status: Home / Self Care | Payer: Medicare Other | Source: Ambulatory Visit | Attending: Radiation Oncology | Admitting: Radiation Oncology

## 2020-06-05 ENCOUNTER — Other Ambulatory Visit: Payer: Self-pay

## 2020-06-05 DIAGNOSIS — C3411 Malignant neoplasm of upper lobe, right bronchus or lung: Secondary | ICD-10-CM | POA: Insufficient documentation

## 2020-06-05 DIAGNOSIS — R911 Solitary pulmonary nodule: Secondary | ICD-10-CM | POA: Diagnosis not present

## 2020-06-05 LAB — BASIC METABOLIC PANEL
Anion gap: 9 (ref 5–15)
BUN: 25 mg/dL — ABNORMAL HIGH (ref 8–23)
CO2: 22 mmol/L (ref 22–32)
Calcium: 9.2 mg/dL (ref 8.9–10.3)
Chloride: 106 mmol/L (ref 98–111)
Creatinine, Ser: 1.12 mg/dL (ref 0.61–1.24)
GFR calc Af Amer: >60 mL/min (ref >60)
GFR calc non Af Amer: 59 mL/min — ABNORMAL LOW (ref >60)
Glucose, Bld: 138 mg/dL — ABNORMAL HIGH (ref 70–99)
Potassium: 4.6 mmol/L (ref 3.5–5.1)
Sodium: 137 mmol/L (ref 135–145)

## 2020-06-05 MED ORDER — IOHEXOL 300 MG/ML  SOLN
60.0000 mL | Freq: Once | INTRAMUSCULAR | Status: AC | PRN
  Administered 2020-06-05: 60 mL via INTRAVENOUS

## 2020-06-07 ENCOUNTER — Other Ambulatory Visit: Payer: Self-pay

## 2020-06-07 ENCOUNTER — Encounter: Payer: Self-pay | Admitting: Physical Therapy

## 2020-06-07 ENCOUNTER — Ambulatory Visit: Payer: Medicare Other | Admitting: Physical Therapy

## 2020-06-07 DIAGNOSIS — R296 Repeated falls: Secondary | ICD-10-CM

## 2020-06-07 DIAGNOSIS — R262 Difficulty in walking, not elsewhere classified: Secondary | ICD-10-CM

## 2020-06-07 DIAGNOSIS — M6281 Muscle weakness (generalized): Secondary | ICD-10-CM

## 2020-06-07 NOTE — Therapy (Signed)
[facility] [address] [place], [address] Phone: [phone]   Fax:  [phone]  Physical Therapy Treatment  Patient Details  Name: Howard [name] MRN: [id number] Date of Birth: March 10, 1931 No data recorded  Encounter Date: 06/07/2020   PT End of Session - 06/07/20 0911    Visit Number 5    Number of Visits 17    Date for PT Re-Evaluation 07/19/20    Authorization - Visit Number 5    Authorization - Number of Visits 10    PT Start Time 0902    PT Stop Time 0942    PT Time Calculation (min) 40 min    Equipment Utilized During Treatment Gait belt    Activity Tolerance Patient tolerated treatment well    Behavior During Therapy [facility] for tasks assessed/performed           Past Medical History:  Diagnosis Date  . Arthritis   . Bell palsy   . Cancer [facility])    prostate and skin  . Chronic combined systolic and diastolic CHF, NYHA class 1 ([place])    a. 07/2014 Echo: EF 35-40%, Gr 1 DD.  Marland Kitchen Complete heart block ([place])    a. 11/2010 s/p SJM 2210 Accent DC PPM, ser# [id number].  . Depression   . Diabetes mellitus without complication ([place])   . Fall 11-10-14  . GERD (gastroesophageal reflux disease)   . History of prostate cancer   . Hyperlipidemia   . Hypertension   . LBBB (left bundle branch block)   . Left-sided Bell's palsy   . Lung cancer ([place]) 2016  . NICM (nonischemic cardiomyopathy) ([place])    a. 07/2014 Echo: EF 35-40%, mid-apicalanteroseptal DK, Gr 1 DD, mild-mod dil LA.  . Non-obstructive CAD    a. 07/2014 Abnl MV;  b. 08/2014 Cath: LM nl, LAD 30p, RI 40p, LCX nl, OM1 40, RCA dominant 30p, 70d-->Med Rx.  Marland Kitchen Poor balance   . Presence of permanent cardiac pacemaker   . Sleep apnea    a. cpap  . Vertigo   . WPW (Wolff-Parkinson-White syndrome)    a. S/P RFCA 1991.    Past Surgical History:  Procedure Laterality Date  . ABDOMINAL AORTIC ENDOVASCULAR STENT GRAFT  08/25/2019   Procedure: ABDOMINAL  AORTIC ENDOVASCULAR STENT GRAFT;  Surgeon: Algernon Huxley, MD;  Location: [facility] ORS;  Service: Vascular;;  . ANGIOPLASTY Left 08/25/2019   Procedure: ANGIOPLASTY;  Surgeon: Algernon Huxley, MD;  Location: [facility] ORS;  Service: Vascular;  Laterality: Left;  left SFA and stent placement  . APPLICATION OF WOUND VAC Left 06/07/2015   Procedure: APPLICATION OF WOUND VAC;  Surgeon: Robert Bellow, MD;  Location: [facility] ORS;  Service: General;  Laterality: Left;  left upper back  . BACK SURGERY     2011  . CARDIAC CATHETERIZATION  08/26/2014   Single vessel obstructive CAD  . CARPAL TUNNEL RELEASE  04-04-15   [facility]  . CATARACT EXTRACTION  07-31-11 and 09-18-11  . Catheter ablation  1991   for WPW  . cervical fusion    . CHOLECYSTECTOMY  09-07-14  . ENDARTERECTOMY FEMORAL Left 08/25/2019   Procedure: ENDARTERECTOMY FEMORAL;  Surgeon: Algernon Huxley, MD;  Location: [facility] ORS;  Service: Vascular;  Laterality: Left;  common and produndis   . ENDOVASCULAR REPAIR/STENT GRAFT Right 08/25/2019   Procedure: ENDOVASCULAR REPAIR/STENT GRAFT;  Surgeon: Algernon Huxley, MD;  Location: [facility] ORS;  Service: Vascular;  Laterality: Right;  renal artery  . HAND SURGERY     right 1993; left 2005  . HERNIA REPAIR  1955  . INSERT / REPLACE / REMOVE PACEMAKER    . INSERTION OF ILIAC STENT Bilateral 08/25/2019   Procedure: INSERTION OF ILIAC STENT;  Surgeon: Algernon Huxley, MD;  Location: [facility] ORS;  Service: Vascular;  Laterality: Bilateral;  . JOINT REPLACEMENT Left 2013   knee  . JOINT REPLACEMENT Right 2004   knee  . KNEE SURGERY     left knee 1991 and 1992; right knee 1995  . LEFT HEART CATHETERIZATION WITH CORONARY ANGIOGRAM N/A 08/26/2014   Procedure: LEFT HEART CATHETERIZATION WITH CORONARY ANGIOGRAM;  Surgeon: [name], MD;  Location[facility] CATH LAB;  Service: Cardiovascular;  Laterality: N/A;  . LOWER EXTREMITY ANGIOGRAPHY Left 08/23/2019   Procedure: Lower Extremity Angiography;  Surgeon: Algernon Huxley, MD;  Location: [place] CV  LAB;  Service: Cardiovascular;  Laterality: Left;  . LUMBAR LAMINECTOMY/DECOMPRESSION MICRODISCECTOMY N/A 06/07/2014   Procedure: LUMBAR FOUR TO FIVE LUMBAR LAMINECTOMY/DECOMPRESSION MICRODISCECTOMY 1 LEVEL;  Surgeon: [name], MD;  Location: [place] NEURO ORS;  Service: Neurosurgery;  Laterality: N/A;  . LUNG BIOPSY Right 2016   Dr [name]  . PACEMAKER INSERTION     PPM-- St Jude 11/30/10 by [name]  . PPM GENERATOR CHANGEOUT N/A 07/09/2019   Procedure: PPM GENERATOR CHANGEOUT;  Surgeon: Deboraha Sprang, MD;  Location: [place] CV LAB;  Service: Cardiovascular;  Laterality: N/A;  . PROSTATE SURGERY     cancer--1998, prostatectomy  . REPLACEMENT TOTAL KNEE     2004  . ruptured disc     1962 and 1998  . TEE WITHOUT CARDIOVERSION N/A 09/01/2019   Procedure: TRANSESOPHAGEAL ECHOCARDIOGRAM (TEE);  Surgeon: [name], MD;  Location: [facility] ORS;  Service: Cardiovascular;  Laterality: N/A;  . TEMPORARY PACEMAKER N/A 07/09/2019   Procedure: TEMPORARY PACEMAKER;  Surgeon: Deboraha Sprang, MD;  Location: [place] CV LAB;  Service: Cardiovascular;  Laterality: N/A;  . TRIGGER FINGER RELEASE  01-24-15  . WOUND DEBRIDEMENT Left 06/07/2015   Procedure: DEBRIDEMENT WOUND;  Surgeon: Robert Bellow, MD;  Location: [facility] ORS;  Service: General;  Laterality: Left;  left upper back    There were no vitals filed for this visit.   Subjective Assessment - 06/07/20 0908    Subjective Pt enters pt without crutch again today. Pt reports no pain currently.  Pt reports pain in L back when he wakes up in the morning, reports no radiating symptoms so that makes him "feel better about it". Pt reports he has not performed his exercises since last session d/t wedding preparation.    Pertinent History Pt is a 86 y/o male familiar with this clinic, that presents to therapy with gait and strength deficits since vascular surgery in Sept 2020. Patient had difficulty ambulating prior to this for a "few months", and eventually was  hospitalized for cellulitis of his L toe d/t decreased blood flow. Following vascular surgery in Sept 2020, patient had HHPT for 6 weeks and was doing "pretty well". Reports over the month he has had insideous onset of increased L foot swelling and decreased balance with gait. He notes that his swelling is down today (visably not swollen), but that his feet have been swelling at night, and that is made somewhat better by propping it up; denies any pain. Patient has been using a SPC for "years" but ambulates into clinic today with loftstrand cruch in [place], which he  reports someone gave to him recently, and he thinks this is helping his balance. Patient has a history of DM2 with periphreal neuropathy in L foot, reporting deminished sensation "all over" the L foot. Denies any falls in the past 6 months, but reports his wife thinks he is very unstable. Prior to surgery patient was walking daily, but reports he has not done this in some time. Would like to return to walking "a few times a week", decrease swelling in his L foot, and increase his balance as he does report he has to catch himself often when he is entering the 3 steps into his home (bilat handrails) and has been furniture walking recently. Pt denies N/V, B&B changes, unexplained weight fluctuation, saddle paresthesia, fever, night sweats, or unrelenting night pain at this time.    Limitations Lifting;Walking;House hold activities    How long can you sit comfortably? Unlimited    How long can you stand comfortably? unlmited    How long can you walk comfortably? 70mins    Patient Stated Goals Walk without having to grab onto things    Pain Onset More than a month ago            TherEx SLB no UE support 9 sec on R, 5 sec on L SLB endurance x30 sec B with unilateral light touch and CGA Tandem stance on foam x30 sec B, x1 min B with 1 min rest breaks standing on foam, intermittent UE support and CGA Obstacle course x2 with alternating cone tapping  and step up onto foam for UE reaching for cones with effective use of stepping strategy for LOBx1 with CGA  Gait Training Gait in clinic with frequent verbal cueing to increase L step length with decent carry over, 2x200' with 10' of cone weaving and step up and over foam for maintaining appropriate step length and BOS while navigating obstacles, seated rest break between d/t fatigue and cramping in posterior LLE      PT Education - 06/07/20 0910    Education Details Therex form/technique; gait mechanics    Person(s) Educated Patient    Methods Explanation;Demonstration;Verbal cues    Comprehension Verbalized understanding;Returned demonstration;Verbal cues required            PT Short Term Goals - 05/24/20 1237      PT SHORT TERM GOAL #1   Title Pt will be independent with HEP in order to improve strength and balance in order to decrease fall risk and improve function at home and work.    Baseline 06/12/20 HEP given    Time 4    Period Weeks    Status New      PT SHORT TERM GOAL #2   Title Pt will improve BERG by at least 3 points in order to demonstrate clinically significant improvement in balance.    Baseline 05/24/20 38/56    Time 5    Period Weeks    Status New      PT SHORT TERM GOAL #3   Title Pt will improve DGI by at least 3 points in order to demonstrate clinically significant improvement in balance and decreased risk for falls.    Baseline 05/24/20 16/24    Time 5    Status New             PT Long Term Goals - 05/24/20 1247      PT LONG TERM GOAL #1   Title Pt will demonstrate gait speed of 1.60m/s or faster to  demonstrate normal community walk speed    Baseline 05/24/20 0.81m/s    Time 8    Period Weeks    Status New      PT LONG TERM GOAL #2   Title Pt will demonstrate FGA score of at least 22/30 to demonstrate least fall risk in community    Baseline 05/24/20 13/30    Time 8    Period Weeks    Status New      PT LONG TERM GOAL #3   Title Pt will  demonstrate BERG score of at least 46/56 to demonstrate decreased community fall risk    Baseline 05/24/20 38/56    Time 8    Period Weeks    Status New      PT LONG TERM GOAL #4   Title Pt will decrease 5TSTS to at least 14.8 seconds in order to demonstrate clinically significant improvement and age matched norm in LE strength.    Baseline 05/24/20 18sec    Time 8    Period Weeks    Status New      PT LONG TERM GOAL #5   Title Pt will decrease TUG to below 12 seconds in order to demonstrate decreased fall risk.    Baseline 05/24/20 14sec    Time 8    Period Weeks                 Plan - 06/07/20 0912    Clinical Impression Statement PT continued therex for dynamic balance, BLE strengthening and gait training with good success.  Pt requires guarding throughout session for safety, but is able complete balance activities safely with effective stepping strategy for one LOB during dynamic balance.  Pt able to walk 200' with frequent cueing for increased L step length before requiring rest break d/t fatigue and cramping in L posterior thigh.  PT will continue to progress as able.    Personal Factors and Comorbidities Age;Comorbidity 1;Comorbidity 3+;Comorbidity 2;Past/Current Experience;Fitness    Comorbidities DM2 with periphreal nueropathy, GERD, HTN, HLD, past lung cancer, arthritis, vertigo, PAD    Examination-Activity Limitations Stairs;Carry;Squat;Locomotion Level;Transfers    Examination-Participation Restrictions Cleaning;Yard Work;Community Activity;Church    Stability/Clinical Decision Making Evolving/Moderate complexity    Rehab Potential Good    Clinical Impairments Affecting Rehab Potential (+) social support, motivation (-) age, chronicity of pain, other comorbidities, multiple failed attempts at pain relief (injection, sx, past PT)    PT Frequency 2x / week    PT Duration 8 weeks    PT Treatment/Interventions ADLs/Self Care Home Management;Electrical Stimulation;Therapeutic  activities;Patient/family education;Therapeutic exercise;DME Instruction;Gait training;Stair training;Moist Heat;Cryotherapy;Ultrasound;Aquatic Therapy;Functional mobility training;Manual techniques;Dry needling;Passive range of motion;Neuromuscular re-education;Balance training;Vestibular;Joint Manipulations;Spinal Manipulations;Energy conservation    PT Next Visit Plan dynamic balance, tband to HEP, gait mechanics/endurance    PT Home Exercise Plan STS, standing hip abd, stagger stance    Consulted and Agree with Plan of Care Patient           Patient will benefit from skilled therapeutic intervention in order to improve the following deficits and impairments:  Abnormal gait, Dizziness, Impaired sensation, Improper body mechanics, Postural dysfunction, Decreased mobility, Decreased coordination, Decreased activity tolerance, Decreased endurance, Decreased strength, Decreased balance, Difficulty walking  Visit Diagnosis: Difficulty in walking, not elsewhere classified  Muscle weakness (generalized)  Repeated falls     Problem List Patient Active Problem List   Diagnosis Date Noted  . AAA (abdominal aortic aneurysm) without rupture ([place]) 12/10/2019  . Renal artery stenosis ([place]) 12/10/2019  . PAD (  peripheral artery disease) ([place]) 12/10/2019  . Thromboembolism ([place]) 08/30/2019  . Atherosclerosis of native arteries of extremity with intermittent claudication ([place]) 08/23/2019  . Cellulitis 08/22/2019  . Swelling of limb 08/17/2019  . Pain in limb 08/17/2019  . NICM (nonischemic cardiomyopathy) ([place]) 07/01/2019  . CHF (congestive heart failure) ([place]) 07/01/2019  . Malignant neoplasm of right lung ([place]) 04/18/2016  . CAD (coronary artery disease) 04/01/2016  . Adenocarcinoma ([place]) 04/01/2016  . Skin cyst 12/26/2015  . GERD (gastroesophageal reflux disease) 06/16/2015  . Abscess of back 06/06/2015  . Vertigo 03/27/2015  . Carpal tunnel syndrome 10/28/2014  . Status post  cholecystectomy 09/29/2014  . Disease of digestive tract 09/29/2014  . Cardiomyopathy ([place]) 09/26/2014  . Type 2 diabetes mellitus without complications ([place])   . Sleep apnea   . Spinal stenosis, lumbar region, with neurogenic claudication 06/07/2014  . Lumbar stenosis with neurogenic claudication 06/07/2014  . Abnormal gait 08/20/2012  . H/O total knee replacement 08/20/2012  . Arthritis of knee, degenerative 08/20/2012  . Pacemaker-St.Jude 08/03/2012  . Cardiac conduction disorder 06/19/2012  . Acid reflux 06/18/2012  . 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 Triggering of digit 03/25/2012  . Hyperlipidemia 12/23/2011  . Essential hypertension 03/23/2011  . Atrioventricular block, complete ([place]) 03/23/2011  . Complete atrioventricular block ([place]) 03/23/2011    Durwin Reges DPT Chinita Greenland, SPT Durwin Reges 06/07/2020, 10:58 AM  [place] [facility] [address], [place], [address] Phone: [phone]   Fax:  [phone]  Name: Howard [name] MRN: [id number] Date of Birth: 1931/05/02

## 2020-06-08 ENCOUNTER — Encounter: Payer: Self-pay | Admitting: Radiation Oncology

## 2020-06-08 ENCOUNTER — Other Ambulatory Visit: Payer: Self-pay

## 2020-06-09 ENCOUNTER — Ambulatory Visit
Admission: RE | Admit: 2020-06-09 | Discharge: 2020-06-09 | Disposition: A | Discharge status: Home / Self Care | Payer: Medicare Other | Source: Ambulatory Visit | Attending: Radiation Oncology | Admitting: Radiation Oncology

## 2020-06-09 ENCOUNTER — Encounter: Payer: Self-pay | Admitting: Radiation Oncology

## 2020-06-09 ENCOUNTER — Other Ambulatory Visit: Payer: Self-pay | Admitting: *Deleted

## 2020-06-09 ENCOUNTER — Ambulatory Visit: Payer: Medicare Other | Admitting: Physical Therapy

## 2020-06-09 VITALS — BP 153/75 | HR 73 | Temp 97.1°F | Resp 16 | Wt 188.9 lb

## 2020-06-09 DIAGNOSIS — C3411 Malignant neoplasm of upper lobe, right bronchus or lung: Secondary | ICD-10-CM

## 2020-06-09 NOTE — Progress Notes (Signed)
Radiation Oncology Follow up Note  Name: Howard [name]   Date:   06/09/2020 MRN:  [id number] DOB: [birth year]    This 86 y.o. male presents to the clinic today for 4 and half year follow-up status post SBRT to his right upper middle lobe for stage I adenocarcinoma.  REFERRING PROVIDER: Margo Common, PA  HPI: Patient is an 86 year old male now out for 1/2 years status post SBRT to his right upper middle lobe for stage I adenocarcinoma.  Seen today in routine follow-up he is doing well specifically [place] any cough hemoptysis chest tightness does have a lot of clear phlegm that he does cough up.  We have been tracking a nodule  in the right lung base which is remained stable over the past 3 years..  Recent CT scan showed an an increase of 1.6 cm worrisome for malignancy or metachronous primary in the right lower lobe.  No additional sites of metastatic disease or disease progression of the chest is noted.  COMPLICATIONS OF TREATMENT: none  FOLLOW UP COMPLIANCE: keeps appointments   PHYSICAL EXAM:  BP (!) 153/75 (BP Location: Right Arm, Patient Position: Sitting, Cuff Size: Normal)   Pulse 73   Temp (!) 97.1 F (36.2 C) (Tympanic)   Resp 16   Wt 188 lb 14.4 oz (85.7 kg)   BMI 27.90 kg/m  Well-developed well-nourished patient in NAD. HEENT reveals PERLA, EOMI, discs not visualized.  Oral cavity is clear. No oral mucosal lesions are identified. Neck is clear without evidence of cervical or supraclavicular adenopathy. Lungs are clear to A&P. Cardiac examination is essentially unremarkable with regular rate and rhythm without murmur rub or thrill. Abdomen is benign with no organomegaly or masses noted. Motor sensory and DTR levels are equal and symmetric in the upper and lower extremities. Cranial nerves II through XII are grossly intact. Proprioception is intact. No peripheral adenopathy or edema is identified. No motor or sensory levels are noted. Crude visual fields are within normal  range.  RADIOLOGY RESULTS: CT scan reviewed PET CT scan ordered  PLAN: At this time of ordered a PET CT scan should there be hypermetabolic activity in this progressive right lower lobe mass would offer SBRT.  I have set up the PET/CT with a follow-up appointment several days later to discuss the results.  Patient and wife both comprehend my recommendations well.  I would like to take this opportunity to thank you for allowing me to participate in the care of your patient.Noreene Filbert, MD

## 2020-06-14 ENCOUNTER — Ambulatory Visit: Payer: Medicare Other | Admitting: Physical Therapy

## 2020-06-14 ENCOUNTER — Ambulatory Visit (INDEPENDENT_AMBULATORY_CARE_PROVIDER_SITE_OTHER): Payer: Medicare Other | Admitting: Nurse Practitioner

## 2020-06-14 ENCOUNTER — Encounter: Payer: Self-pay | Admitting: Physical Therapy

## 2020-06-14 ENCOUNTER — Other Ambulatory Visit (INDEPENDENT_AMBULATORY_CARE_PROVIDER_SITE_OTHER): Payer: Medicare Other

## 2020-06-14 ENCOUNTER — Encounter (INDEPENDENT_AMBULATORY_CARE_PROVIDER_SITE_OTHER): Payer: Medicare Other

## 2020-06-14 ENCOUNTER — Other Ambulatory Visit: Payer: Self-pay

## 2020-06-14 DIAGNOSIS — M6281 Muscle weakness (generalized): Secondary | ICD-10-CM

## 2020-06-14 DIAGNOSIS — R262 Difficulty in walking, not elsewhere classified: Secondary | ICD-10-CM

## 2020-06-14 DIAGNOSIS — R296 Repeated falls: Secondary | ICD-10-CM

## 2020-06-14 NOTE — Therapy (Signed)
[place] [facility] [address], [place], [address] Phone: [phone]   Fax:  [phone]  Physical Therapy Treatment  Patient Details  Name: Howard [name] MRN: [id number] Date of Birth: [birth year] No data recorded  Encounter Date: 06/14/2020   PT End of Session - 06/14/20 1435    Visit Number 6    Number of Visits 17    Date for PT Re-Evaluation 07/19/20    Authorization - Visit Number 6    Authorization - Number of Visits 10    PT Start Time 2694    PT Stop Time 1430    PT Time Calculation (min) 45 min    Equipment Utilized During Treatment Gait belt    Activity Tolerance Patient tolerated treatment well    Behavior During Therapy [facility] for tasks assessed/performed           Past Medical History:  Diagnosis Date  . Arthritis   . Bell palsy   . Cancer [facility])    prostate and skin  . Chronic combined systolic and diastolic CHF, NYHA class 1 ([place])    a. 07/2014 Echo: EF 35-40%, Gr 1 DD.  Marland Kitchen Complete heart block ([place])    a. 11/2010 s/p SJM 2210 Accent DC PPM, ser# [id number].  . Depression   . Diabetes mellitus without complication ([place])   . Fall 11-10-14  . GERD (gastroesophageal reflux disease)   . History of prostate cancer   . Hyperlipidemia   . Hypertension   . LBBB (left bundle branch block)   . Left-sided Bell's palsy   . Lung cancer ([place]) 2016  . NICM (nonischemic cardiomyopathy) ([place])    a. 07/2014 Echo: EF 35-40%, mid-apicalanteroseptal DK, Gr 1 DD, mild-mod dil LA.  . Non-obstructive CAD    a. 07/2014 Abnl MV;  b. 08/2014 Cath: LM nl, LAD 30p, RI 40p, LCX nl, OM1 40, RCA dominant 30p, 70d-->Med Rx.  Marland Kitchen Poor balance   . Presence of permanent cardiac pacemaker   . Sleep apnea    a. cpap  . Vertigo   . WPW (Wolff-Parkinson-White syndrome)    a. S/P RFCA 1991.    Past Surgical History:  Procedure Laterality Date  . ABDOMINAL AORTIC ENDOVASCULAR STENT GRAFT  08/25/2019   Procedure: ABDOMINAL  AORTIC ENDOVASCULAR STENT GRAFT;  Surgeon: Algernon Huxley, MD;  Location: [facility] ORS;  Service: Vascular;;  . ANGIOPLASTY Left 08/25/2019   Procedure: ANGIOPLASTY;  Surgeon: Algernon Huxley, MD;  Location: [facility] ORS;  Service: Vascular;  Laterality: Left;  left SFA and stent placement  . APPLICATION OF WOUND VAC Left 06/07/2015   Procedure: APPLICATION OF WOUND VAC;  Surgeon: Robert Bellow, MD;  Location: [facility] ORS;  Service: General;  Laterality: Left;  left upper back  . BACK SURGERY     2011  . CARDIAC CATHETERIZATION  08/26/2014   Single vessel obstructive CAD  . CARPAL TUNNEL RELEASE  04-04-15   [facility]  . CATARACT EXTRACTION  07-31-11 and 09-18-11  . Catheter ablation  1991   for WPW  . cervical fusion    . CHOLECYSTECTOMY  09-07-14  . ENDARTERECTOMY FEMORAL Left 08/25/2019   Procedure: ENDARTERECTOMY FEMORAL;  Surgeon: Algernon Huxley, MD;  Location: [facility] ORS;  Service: Vascular;  Laterality: Left;  common and produndis   . ENDOVASCULAR REPAIR/STENT GRAFT Right 08/25/2019   Procedure: ENDOVASCULAR REPAIR/STENT GRAFT;  Surgeon: Algernon Huxley, MD;  Location: [facility] ORS;  Service: Vascular;  Laterality: Right;  renal artery  . HAND SURGERY     right 1993; left 2005  . HERNIA REPAIR  1955  . INSERT / REPLACE / REMOVE PACEMAKER    . INSERTION OF ILIAC STENT Bilateral 08/25/2019   Procedure: INSERTION OF ILIAC STENT;  Surgeon: Algernon Huxley, MD;  Location: [facility] ORS;  Service: Vascular;  Laterality: Bilateral;  . JOINT REPLACEMENT Left 2013   knee  . JOINT REPLACEMENT Right 2004   knee  . KNEE SURGERY     left knee 1991 and 1992; right knee 1995  . LEFT HEART CATHETERIZATION WITH CORONARY ANGIOGRAM N/A 08/26/2014   Procedure: LEFT HEART CATHETERIZATION WITH CORONARY ANGIOGRAM;  Surgeon: [name], MD;  Location: [facility] CATH LAB;  Service: Cardiovascular;  Laterality: N/A;  . LOWER EXTREMITY ANGIOGRAPHY Left 08/23/2019   Procedure: Lower Extremity Angiography;  Surgeon: Algernon Huxley, MD;  Location: [place] CV  LAB;  Service: Cardiovascular;  Laterality: Left;  . LUMBAR LAMINECTOMY/DECOMPRESSION MICRODISCECTOMY N/A 06/07/2014   Procedure: LUMBAR FOUR TO FIVE LUMBAR LAMINECTOMY/DECOMPRESSION MICRODISCECTOMY 1 LEVEL;  Surgeon: [name], MD;  Location: [place] NEURO ORS;  Service: Neurosurgery;  Laterality: N/A;  . LUNG BIOPSY Right 2016   Dr [name]  . PACEMAKER INSERTION     PPM-- St Jude 11/30/10 by [name]  . PPM GENERATOR CHANGEOUT N/A 07/09/2019   Procedure: PPM GENERATOR CHANGEOUT;  Surgeon: Deboraha Sprang, MD;  Location: [place] CV LAB;  Service: Cardiovascular;  Laterality: N/A;  . PROSTATE SURGERY     cancer--1998, prostatectomy  . REPLACEMENT TOTAL KNEE     2004  . ruptured disc     1962 and 1998  . TEE WITHOUT CARDIOVERSION N/A 09/01/2019   Procedure: TRANSESOPHAGEAL ECHOCARDIOGRAM (TEE);  Surgeon: [name], MD;  Location: [facility] ORS;  Service: Cardiovascular;  Laterality: N/A;  . TEMPORARY PACEMAKER N/A 07/09/2019   Procedure: TEMPORARY PACEMAKER;  Surgeon: Deboraha Sprang, MD;  Location: [place] CV LAB;  Service: Cardiovascular;  Laterality: N/A;  . TRIGGER FINGER RELEASE  01-24-15  . WOUND DEBRIDEMENT Left 06/07/2015   Procedure: DEBRIDEMENT WOUND;  Surgeon: Robert Bellow, MD;  Location: [facility] ORS;  Service: General;  Laterality: Left;  left upper back    There were no vitals filed for this visit.   Subjective Assessment - 06/14/20 1351    Subjective Pt received results from chest MRI last week showing potential malignancy in the lungs.  Pt scheduled for PET scan next Monday 06/19/20 with a follow up on Wednesday 06/21/20, and will be receiving 5 radiation treatments if positive.  Pt reports no pain today and some compliance with HEP at home.    Pertinent History Pt is a 86 y/o male familiar with this clinic, that presents to therapy with gait and strength deficits since vascular surgery in Sept 2020. Patient had difficulty ambulating prior to this for a "few months", and eventually  was hospitalized for cellulitis of his L toe d/t decreased blood flow. Following vascular surgery in Sept 2020, patient had HHPT for 6 weeks and was doing "pretty well". Reports over the month he has had insideous onset of increased L foot swelling and decreased balance with gait. He notes that his swelling is down today (visably not swollen), but that his feet have been swelling at night, and that is made somewhat better by propping it up; denies any pain. Patient has been using a SPC for "years" but ambulates into clinic today with loftstrand cruch in [place], which he  reports someone gave to him recently, and he thinks this is helping his balance. Patient has a history of DM2 with periphreal neuropathy in L foot, reporting deminished sensation "all over" the L foot. Denies any falls in the past 6 months, but reports his wife thinks he is very unstable. Prior to surgery patient was walking daily, but reports he has not done this in some time. Would like to return to walking "a few times a week", decrease swelling in his L foot, and increase his balance as he does report he has to catch himself often when he is entering the 3 steps into his home (bilat handrails) and has been furniture walking recently. Pt denies N/V, B&B changes, unexplained weight fluctuation, saddle paresthesia, fever, night sweats, or unrelenting night pain at this time.    Limitations Lifting;Walking;House hold activities    How long can you sit comfortably? Unlimited    How long can you stand comfortably? unlmited    How long can you walk comfortably? 61mins    Patient Stated Goals Walk without having to grab onto things    Currently in Pain? No/denies    Pain Onset More than a month ago            TherEx Nustep L3 seat setting 11 SLB endurance 2x30 sec B with unilateral light touch and CGA Tandem stance on foam 2x30 sec B, with 1 min rest breaks standing on foam, intermittent UE support and CGA Walking over uneven surfaces (10"  mat with balance stones underneath) x4 Lateral walking with alternating forward cone tapping Alternating cone tapping 2x10 for dynamic SLB Alternating cone tapping anterior and across midline x5 each leg Tandem walking 10"   Gait Training Gait in clinic with frequent verbal cueing to increase L step length with decent carry over, 2x200' with 10' of cone weaving and seated rest breaks between with reports of fatigue during second bout of walking     PT Education - 06/14/20 1435    Education Details Therex form/technique; gait mechanics    Person(s) Educated Patient    Methods Explanation;Demonstration;Verbal cues    Comprehension Verbalized understanding;Returned demonstration;Verbal cues required            PT Short Term Goals - 05/24/20 1237      PT SHORT TERM GOAL #1   Title Pt will be independent with HEP in order to improve strength and balance in order to decrease fall risk and improve function at home and work.    Baseline 06/12/20 HEP given    Time 4    Period Weeks    Status New      PT SHORT TERM GOAL #2   Title Pt will improve BERG by at least 3 points in order to demonstrate clinically significant improvement in balance.    Baseline 05/24/20 38/56    Time 5    Period Weeks    Status New      PT SHORT TERM GOAL #3   Title Pt will improve DGI by at least 3 points in order to demonstrate clinically significant improvement in balance and decreased risk for falls.    Baseline 05/24/20 16/24    Time 5    Status New             PT Long Term Goals - 05/24/20 1247      PT LONG TERM GOAL #1   Title Pt will demonstrate gait speed of 1.10m/s or faster to demonstrate normal community walk speed  Baseline 05/24/20 0.6m/s    Time 8    Period Weeks    Status New      PT LONG TERM GOAL #2   Title Pt will demonstrate FGA score of at least 22/30 to demonstrate least fall risk in community    Baseline 05/24/20 13/30    Time 8    Period Weeks    Status New      PT LONG  TERM GOAL #3   Title Pt will demonstrate BERG score of at least 46/56 to demonstrate decreased community fall risk    Baseline 05/24/20 38/56    Time 8    Period Weeks    Status New      PT LONG TERM GOAL #4   Title Pt will decrease 5TSTS to at least 14.8 seconds in order to demonstrate clinically significant improvement and age matched norm in LE strength.    Baseline 05/24/20 18sec    Time 8    Period Weeks    Status New      PT LONG TERM GOAL #5   Title Pt will decrease TUG to below 12 seconds in order to demonstrate decreased fall risk.    Baseline 05/24/20 14sec    Time 8    Period Weeks                 Plan - 06/14/20 1443    Clinical Impression Statement PT continued therex for dynamic balance and gait training with good success.  Pt requires guarding throughout therex, but is able to perform therex safely with no LOB.  Pt able to ambulate 200" with less frequent cuing for increase LLE step length with shortened step length occurring more frequently with fatigue.  No reports of cramping or increased pain throughout session.  PT will continue progression as able.    Personal Factors and Comorbidities Age;Comorbidity 1;Comorbidity 3+;Comorbidity 2;Past/Current Experience;Fitness    Comorbidities DM2 with periphreal nueropathy, GERD, HTN, HLD, past lung cancer, arthritis, vertigo, PAD    Examination-Activity Limitations Stairs;Carry;Squat;Locomotion Level;Transfers    Examination-Participation Restrictions Cleaning;Yard Work;Community Activity;Church    Stability/Clinical Decision Making Evolving/Moderate complexity    Clinical Decision Making Moderate    Rehab Potential Good    Clinical Impairments Affecting Rehab Potential (+) social support, motivation (-) age, chronicity of pain, other comorbidities, multiple failed attempts at pain relief (injection, sx, past PT)    PT Frequency 2x / week    PT Duration 8 weeks    PT Treatment/Interventions ADLs/Self Care Home  Management;Electrical Stimulation;Therapeutic activities;Patient/family education;Therapeutic exercise;DME Instruction;Gait training;Stair training;Moist Heat;Cryotherapy;Ultrasound;Aquatic Therapy;Functional mobility training;Manual techniques;Dry needling;Passive range of motion;Neuromuscular re-education;Balance training;Vestibular;Joint Manipulations;Spinal Manipulations;Energy conservation    PT Next Visit Plan dynamic balance, tband to HEP, gait mechanics/endurance    PT Home Exercise Plan STS, standing hip abd, stagger stance    Consulted and Agree with Plan of Care Patient           Patient will benefit from skilled therapeutic intervention in order to improve the following deficits and impairments:  Abnormal gait, Dizziness, Impaired sensation, Improper body mechanics, Postural dysfunction, Decreased mobility, Decreased coordination, Decreased activity tolerance, Decreased endurance, Decreased strength, Decreased balance, Difficulty walking  Visit Diagnosis: Difficulty in walking, not elsewhere classified  Muscle weakness (generalized)  Repeated falls     Problem List Patient Active Problem List   Diagnosis Date Noted  . AAA (abdominal aortic aneurysm) without rupture ([place]) 12/10/2019  . Renal artery stenosis ([place]) 12/10/2019  . PAD (peripheral artery disease) ([place]) 12/10/2019  .  Thromboembolism ([place]) 08/30/2019  . Atherosclerosis of native arteries of extremity with intermittent claudication ([place]) 08/23/2019  . Cellulitis 08/22/2019  . Swelling of limb 08/17/2019  . Pain in limb 08/17/2019  . NICM (nonischemic cardiomyopathy) ([place]) 07/01/2019  . CHF (congestive heart failure) ([place]) 07/01/2019  . Malignant neoplasm of right lung ([place]) 04/18/2016  . CAD (coronary artery disease) 04/01/2016  . Adenocarcinoma ([place]) 04/01/2016  . Skin cyst 12/26/2015  . GERD (gastroesophageal reflux disease) 06/16/2015  . Abscess of back 06/06/2015  . Vertigo 03/27/2015  . Carpal tunnel  syndrome 10/28/2014  . Status post cholecystectomy 09/29/2014  . Disease of digestive tract 09/29/2014  . Cardiomyopathy ([place]) 09/26/2014  . Type 2 diabetes mellitus without complications ([place])   . Sleep apnea   . Spinal stenosis, lumbar region, with neurogenic claudication 06/07/2014  . Lumbar stenosis with neurogenic claudication 06/07/2014  . Abnormal gait 08/20/2012  . H/O total knee replacement 08/20/2012  . Arthritis of knee, degenerative 08/20/2012  . Pacemaker-St.Jude 08/03/2012  . Cardiac conduction disorder 06/19/2012  . Acid reflux 06/18/2012  . 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 Triggering of digit 03/25/2012  . Hyperlipidemia 12/23/2011  . Essential hypertension 03/23/2011  . Atrioventricular block, complete ([place]) 03/23/2011  . Complete atrioventricular block ([place]) 03/23/2011   Durwin Reges DPT Chinita [place], SPT Durwin Reges 06/14/2020, 4:28 PM  [place] [facility] [address], [place], [address] Phone: [phone]   Fax:  [phone]  Name: Howard [name] MRN: [id number] Date of Birth: 11/18/1931

## 2020-06-19 ENCOUNTER — Other Ambulatory Visit: Payer: Self-pay

## 2020-06-19 ENCOUNTER — Ambulatory Visit
Admission: RE | Admit: 2020-06-19 | Discharge: 2020-06-19 | Disposition: A | Discharge status: Home / Self Care | Payer: Medicare Other | Source: Ambulatory Visit | Attending: Radiation Oncology | Admitting: Radiation Oncology

## 2020-06-19 DIAGNOSIS — I700 Atherosclerosis of aorta: Secondary | ICD-10-CM | POA: Diagnosis not present

## 2020-06-19 DIAGNOSIS — N270 Small kidney, unilateral: Secondary | ICD-10-CM | POA: Diagnosis not present

## 2020-06-19 DIAGNOSIS — I2510 Atherosclerotic heart disease of native coronary artery without angina pectoris: Secondary | ICD-10-CM | POA: Insufficient documentation

## 2020-06-19 DIAGNOSIS — C3411 Malignant neoplasm of upper lobe, right bronchus or lung: Secondary | ICD-10-CM | POA: Diagnosis not present

## 2020-06-19 DIAGNOSIS — D1771 Benign lipomatous neoplasm of kidney: Secondary | ICD-10-CM | POA: Insufficient documentation

## 2020-06-19 LAB — GLUCOSE, CAPILLARY: Glucose-Capillary: 120 mg/dL — ABNORMAL HIGH (ref 70–99)

## 2020-06-19 MED ORDER — FLUDEOXYGLUCOSE F - 18 (FDG) INJECTION
10.5000 | Freq: Once | INTRAVENOUS | Status: AC | PRN
  Administered 2020-06-19: 10.5 via INTRAVENOUS

## 2020-06-21 ENCOUNTER — Encounter: Payer: Self-pay | Admitting: Physical Therapy

## 2020-06-21 ENCOUNTER — Other Ambulatory Visit: Payer: Self-pay | Admitting: *Deleted

## 2020-06-21 ENCOUNTER — Ambulatory Visit
Admission: RE | Admit: 2020-06-21 | Discharge: 2020-06-21 | Disposition: A | Discharge status: Home / Self Care | Payer: Medicare Other | Source: Ambulatory Visit | Attending: Radiation Oncology | Admitting: Radiation Oncology

## 2020-06-21 ENCOUNTER — Ambulatory Visit: Payer: Medicare Other | Admitting: Physical Therapy

## 2020-06-21 ENCOUNTER — Encounter: Payer: Self-pay | Admitting: Radiation Oncology

## 2020-06-21 ENCOUNTER — Other Ambulatory Visit: Payer: Self-pay

## 2020-06-21 VITALS — BP 135/73 | HR 67 | Temp 98.1°F | Resp 16 | Wt 192.0 lb

## 2020-06-21 DIAGNOSIS — R918 Other nonspecific abnormal finding of lung field: Secondary | ICD-10-CM | POA: Insufficient documentation

## 2020-06-21 DIAGNOSIS — C3411 Malignant neoplasm of upper lobe, right bronchus or lung: Secondary | ICD-10-CM

## 2020-06-21 DIAGNOSIS — R262 Difficulty in walking, not elsewhere classified: Secondary | ICD-10-CM

## 2020-06-21 DIAGNOSIS — R296 Repeated falls: Secondary | ICD-10-CM

## 2020-06-21 DIAGNOSIS — M6281 Muscle weakness (generalized): Secondary | ICD-10-CM

## 2020-06-21 DIAGNOSIS — Z923 Personal history of irradiation: Secondary | ICD-10-CM | POA: Insufficient documentation

## 2020-06-21 NOTE — Therapy (Signed)
[place] [facility] [address] [place], [address] Phone: [phone]   Fax:  [phone]  Physical Therapy Treatment  Patient Details  Name: Howard [name] MRN: [id number] Date of Birth: 1931/03/06 No data recorded  Encounter Date: 06/21/2020   PT End of Session - 06/21/20 0908    Visit Number 7    Number of Visits 17    Date for PT Re-Evaluation 07/19/20    Authorization - Visit Number 7    Authorization - Number of Visits 10    PT Start Time 0900    PT Stop Time 0945    PT Time Calculation (min) 45 min    Equipment Utilized During Treatment Gait belt    Activity Tolerance Patient tolerated treatment well    Behavior During Therapy [facility] for tasks assessed/performed           Past Medical History:  Diagnosis Date  . Arthritis   . Bell palsy   . Cancer [facility])    prostate and skin  . Chronic combined systolic and diastolic CHF, NYHA class 1 ([place])    a. 07/2014 Echo: EF 35-40%, Gr 1 DD.  Marland Kitchen Complete heart block ([place])    a. 11/2010 s/p SJM 2210 Accent DC PPM, ser# [id number].  . Depression   . Diabetes mellitus without complication ([place])   . Fall 11-10-14  . GERD (gastroesophageal reflux disease)   . History of prostate cancer   . Hyperlipidemia   . Hypertension   . LBBB (left bundle branch block)   . Left-sided Bell's palsy   . Lung cancer ([place]) 2016  . NICM (nonischemic cardiomyopathy) ([place])    a. 07/2014 Echo: EF 35-40%, mid-apicalanteroseptal DK, Gr 1 DD, mild-mod dil LA.  . Non-obstructive CAD    a. 07/2014 Abnl MV;  b. 08/2014 Cath: LM nl, LAD 30p, RI 40p, LCX nl, OM1 40, RCA dominant 30p, 70d-->Med Rx.  Marland Kitchen Poor balance   . Presence of permanent cardiac pacemaker   . Sleep apnea    a. cpap  . Vertigo   . WPW (Wolff-Parkinson-White syndrome)    a. S/P RFCA 1991.    Past Surgical History:  Procedure Laterality Date  . ABDOMINAL AORTIC ENDOVASCULAR STENT GRAFT  08/25/2019   Procedure: ABDOMINAL  AORTIC ENDOVASCULAR STENT GRAFT;  Surgeon: Algernon Huxley, MD;  Location: [facility] ORS;  Service: Vascular;;  . ANGIOPLASTY Left 08/25/2019   Procedure: ANGIOPLASTY;  Surgeon: Algernon Huxley, MD;  Location: [facility] ORS;  Service: Vascular;  Laterality: Left;  left SFA and stent placement  . APPLICATION OF WOUND VAC Left 06/07/2015   Procedure: APPLICATION OF WOUND VAC;  Surgeon: Robert Bellow, MD;  Location: [facility] ORS;  Service: General;  Laterality: Left;  left upper back  . BACK SURGERY     2011  . CARDIAC CATHETERIZATION  08/26/2014   Single vessel obstructive CAD  . CARPAL TUNNEL RELEASE  04-04-15   [facility]  . CATARACT EXTRACTION  07-31-11 and 09-18-11  . Catheter ablation  1991   for WPW  . cervical fusion    . CHOLECYSTECTOMY  09-07-14  . ENDARTERECTOMY FEMORAL Left 08/25/2019   Procedure: ENDARTERECTOMY FEMORAL;  Surgeon: Algernon Huxley, MD;  Location: [facility] ORS;  Service: Vascular;  Laterality: Left;  common and produndis   . ENDOVASCULAR REPAIR/STENT GRAFT Right 08/25/2019   Procedure: ENDOVASCULAR REPAIR/STENT GRAFT;  Surgeon: Algernon Huxley, MD;  Location: [facility] ORS;  Service: Vascular;  Laterality: Right;  renal artery  . HAND SURGERY     right 1993; left 2005  . HERNIA REPAIR  1955  . INSERT / REPLACE / REMOVE PACEMAKER    . INSERTION OF ILIAC STENT Bilateral 08/25/2019   Procedure: INSERTION OF ILIAC STENT;  Surgeon: Algernon Huxley, MD;  Location: [facility] ORS;  Service: Vascular;  Laterality: Bilateral;  . JOINT REPLACEMENT Left 2013   knee  . JOINT REPLACEMENT Right 2004   knee  . KNEE SURGERY     left knee 1991 and 1992; right knee 1995  . LEFT HEART CATHETERIZATION WITH CORONARY ANGIOGRAM N/A 08/26/2014   Procedure: LEFT HEART CATHETERIZATION WITH CORONARY ANGIOGRAM;  Surgeon: [name], MD;  Location: [facility] C[facility] LAB;  Service: Cardiovascular;  Laterality: N/A;  . LOWER EXTREMITY ANGIOGRAPHY Left 08/23/2019   Procedure: Lower Extremity Angiography;  Surgeon: Algernon Huxley, MD;  Location: [place] CV  LAB;  Service: Cardiovascular;  Laterality: Left;  . LUMBAR LAMINECTOMY/DECOMPRESSION MICRODISCECTOMY N/A 06/07/2014   Procedure: LUMBAR FOUR TO FIVE LUMBAR LAMINECTOMY/DECOMPRESSION MICRODISCECTOMY 1 LEVEL;  Surgeon: [name], MD;  Location: [place] NEURO ORS;  Service: Neurosurgery;  Laterality: N/A;  . LUNG BIOPSY Right 2016   Dr [name]  . PACEMAKER INSERTION     PPM-- St Jude 11/30/10 by [name]  . PPM GENERATOR CHANGEOUT N/A 07/09/2019   Procedure: PPM GENERATOR CHANGEOUT;  Surgeon: Deboraha Sprang, MD;  Location: [place] CV LAB;  Service: Cardiovascular;  Laterality: N/A;  . PROSTATE SURGERY     cancer--1998, prostatectomy  . REPLACEMENT TOTAL KNEE     2004  . ruptured disc     1962 and 1998  . TEE WITHOUT CARDIOVERSION N/A 09/01/2019   Procedure: TRANSESOPHAGEAL ECHOCARDIOGRAM (TEE);  Surgeon: [name], MD;  Location: [facility] ORS;  Service: Cardiovascular;  Laterality: N/A;  . TEMPORARY PACEMAKER N/A 07/09/2019   Procedure: TEMPORARY PACEMAKER;  Surgeon: Deboraha Sprang, MD;  Location: [place] CV LAB;  Service: Cardiovascular;  Laterality: N/A;  . TRIGGER FINGER RELEASE  01-24-15  . WOUND DEBRIDEMENT Left 06/07/2015   Procedure: DEBRIDEMENT WOUND;  Surgeon: Robert Bellow, MD;  Location: [facility] ORS;  Service: General;  Laterality: Left;  left upper back    There were no vitals filed for this visit.   Subjective Assessment - 06/21/20 0905    Subjective Pt has doctor visit this afternoon for results and POC after PET scan this Monday.  Pt reports no pain or fatigue this morning.  Pt reports some compliance with HEP but that he has been distracted.    Pertinent History Pt is a 86 y/o male familiar with this clinic, that presents to therapy with gait and strength deficits since vascular surgery in Sept 2020. Patient had difficulty ambulating prior to this for a "few months", and eventually was hospitalized for cellulitis of his L toe d/t decreased blood flow. Following vascular surgery  in Sept 2020, patient had HHPT for 6 weeks and was doing "pretty well". Reports over the month he has had insideous onset of increased L foot swelling and decreased balance with gait. He notes that his swelling is down today (visably not swollen), but that his feet have been swelling at night, and that is made somewhat better by propping it up; denies any pain. Patient has been using a SPC for "years" but ambulates into clinic today with loftstrand cruch in [place], which he reports someone gave to him recently, and he thinks this is helping his balance. Patient  has a history of DM2 with periphreal neuropathy in L foot, reporting deminished sensation "all over" the L foot. Denies any falls in the past 6 months, but reports his wife thinks he is very unstable. Prior to surgery patient was walking daily, but reports he has not done this in some time. Would like to return to walking "a few times a week", decrease swelling in his L foot, and increase his balance as he does report he has to catch himself often when he is entering the 3 steps into his home (bilat handrails) and has been furniture walking recently. Pt denies N/V, B&B changes, unexplained weight fluctuation, saddle paresthesia, fever, night sweats, or unrelenting night pain at this time.    Limitations Lifting;Walking;House hold activities    How long can you sit comfortably? Unlimited    How long can you stand comfortably? unlmited    How long can you walk comfortably? 86mins    Patient Stated Goals Walk without having to grab onto things    Currently in Pain? No/denies    Pain Onset More than a month ago           Therapeutic Activity -Amb 375ft with 3x74ft of stepping over 4x3" hurdles to mimic working in yard; pt with progressive decrease in L step length after 249ft d/t pt reported fatigue in LLE -Amb 367ft with 3x26ft of stepping over 4x3" hurdles and picking up 2 cones to mimic working in yard and picking up objects off the ground; pt with  progressive decrease in L step length after 182ft d/t LLE fatigue, pt with reciprocal gait pattern throughout -Amb 298ft with 2x11ft of cone weaving to mimic maneuvering objects in yard and in the community; activity ceased per pt request d/t LLE fatigue/cramping with no occurrences of knee buckling or LOB  TherEx -Nustep L3 seat setting 11 -Lateral step over 4x3" hurdles x4 with cueing for increased step height and width for foot clearance -Alternating cone tapping anterior and across midline with toe touch between x10 -Alternating cone tapping anterior and across midline x10 for SLB     PT Education - 06/21/20 0907    Education Details Therex form/technique; gait mechanics    Person(s) Educated Patient    Methods Explanation;Demonstration;Verbal cues    Comprehension Verbalized understanding;Returned demonstration;Verbal cues required            PT Short Term Goals - 05/24/20 1237      PT SHORT TERM GOAL #1   Title Pt will be independent with HEP in order to improve strength and balance in order to decrease fall risk and improve function at home and work.    Baseline 06/12/20 HEP given    Time 4    Period Weeks    Status New      PT SHORT TERM GOAL #2   Title Pt will improve BERG by at least 3 points in order to demonstrate clinically significant improvement in balance.    Baseline 05/24/20 38/56    Time 5    Period Weeks    Status New      PT SHORT TERM GOAL #3   Title Pt will improve DGI by at least 3 points in order to demonstrate clinically significant improvement in balance and decreased risk for falls.    Baseline 05/24/20 16/24    Time 5    Status New             PT Long Term Goals - 05/24/20 1247  PT LONG TERM GOAL #1   Title Pt will demonstrate gait speed of 1.62m/s or faster to demonstrate normal community walk speed    Baseline 05/24/20 0.62m/s    Time 8    Period Weeks    Status New      PT LONG TERM GOAL #2   Title Pt will demonstrate FGA score of  at least 22/30 to demonstrate least fall risk in community    Baseline 05/24/20 13/30    Time 8    Period Weeks    Status New      PT LONG TERM GOAL #3   Title Pt will demonstrate BERG score of at least 46/56 to demonstrate decreased community fall risk    Baseline 05/24/20 38/56    Time 8    Period Weeks    Status New      PT LONG TERM GOAL #4   Title Pt will decrease 5TSTS to at least 14.8 seconds in order to demonstrate clinically significant improvement and age matched norm in LE strength.    Baseline 05/24/20 18sec    Time 8    Period Weeks    Status New      PT LONG TERM GOAL #5   Title Pt will decrease TUG to below 12 seconds in order to demonstrate decreased fall risk.    Baseline 05/24/20 14sec    Time 8    Period Weeks                 Plan - 06/21/20 5885    Clinical Impression Statement PT continued therex for dynamic balance with good success.  Pt requires supervision with gait activities and CGA for SLB therex.  Pt able to amb 380ft before needing rest break, decreases to 266ft after 3 bouts of gait activities; pt demonstrates progressive decrease in L step length d/t LLE fatigue and cramping after ~284ft.  Pt tolerated tx well with self-reported improvement in balance and no increase in pain.  PT will continue progression as able.    Personal Factors and Comorbidities Age;Comorbidity 1;Comorbidity 3+;Comorbidity 2;Past/Current Experience;Fitness    Comorbidities DM2 with periphreal nueropathy, GERD, HTN, HLD, past lung cancer, arthritis, vertigo, PAD    Examination-Activity Limitations Stairs;Carry;Squat;Locomotion Level;Transfers    Examination-Participation Restrictions Cleaning;Yard Work;Community Activity;Church    Stability/Clinical Decision Making Evolving/Moderate complexity    Clinical Decision Making Moderate    Rehab Potential Good    Clinical Impairments Affecting Rehab Potential (+) social support, motivation (-) age, chronicity of pain, other  comorbidities, multiple failed attempts at pain relief (injection, sx, past PT)    PT Frequency 2x / week    PT Duration 8 weeks    PT Treatment/Interventions ADLs/Self Care Home Management;Electrical Stimulation;Therapeutic activities;Patient/family education;Therapeutic exercise;DME Instruction;Gait training;Stair training;Moist Heat;Cryotherapy;Ultrasound;Aquatic Therapy;Functional mobility training;Manual techniques;Dry needling;Passive range of motion;Neuromuscular re-education;Balance training;Vestibular;Joint Manipulations;Spinal Manipulations;Energy conservation    PT Next Visit Plan dynamic balance, tband to HEP, gait mechanics/endurance    PT Home Exercise Plan STS, standing hip abd, stagger stance    Consulted and Agree with Plan of Care Patient           Patient will benefit from skilled therapeutic intervention in order to improve the following deficits and impairments:  Abnormal gait, Dizziness, Impaired sensation, Improper body mechanics, Postural dysfunction, Decreased mobility, Decreased coordination, Decreased activity tolerance, Decreased endurance, Decreased strength, Decreased balance, Difficulty walking  Visit Diagnosis: Difficulty in walking, not elsewhere classified  Muscle weakness (generalized)  Repeated falls     Problem List  Patient Active Problem List   Diagnosis Date Noted  . AAA (abdominal aortic aneurysm) without rupture ([place]) 12/10/2019  . Renal artery stenosis ([place]) 12/10/2019  . PAD (peripheral artery disease) ([place]) 12/10/2019  . Thromboembolism ([place]) 08/30/2019  . Atherosclerosis of native arteries of extremity with intermittent claudication ([place]) 08/23/2019  . Cellulitis 08/22/2019  . Swelling of limb 08/17/2019  . Pain in limb 08/17/2019  . NICM (nonischemic cardiomyopathy) ([place]) 07/01/2019  . CHF (congestive heart failure) ([place]) 07/01/2019  . Malignant neoplasm of right lung ([place]) 04/18/2016  . CAD (coronary artery disease) 04/01/2016  .  Adenocarcinoma ([place]) 04/01/2016  . Skin cyst 12/26/2015  . GERD (gastroesophageal reflux disease) 06/16/2015  . Abscess of back 06/06/2015  . Vertigo 03/27/2015  . Carpal tunnel syndrome 10/28/2014  . Status post cholecystectomy 09/29/2014  . Disease of digestive tract 09/29/2014  . Cardiomyopathy ([place]) 09/26/2014  . Type 2 diabetes mellitus without complications ([place])   . Sleep apnea   . Spinal stenosis, lumbar region, with neurogenic claudication 06/07/2014  . Lumbar stenosis with neurogenic claudication 06/07/2014  . Abnormal gait 08/20/2012  . H/O total knee replacement 08/20/2012  . Arthritis of knee, degenerative 08/20/2012  . Pacemaker-St.Jude 08/03/2012  . Cardiac conduction disorder 06/19/2012  . Acid reflux 06/18/2012  . 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 Triggering of digit 03/25/2012  . Hyperlipidemia 12/23/2011  . Essential hypertension 03/23/2011  . Atrioventricular block, complete ([place]) 03/23/2011  . Complete atrioventricular block ([place]) 03/23/2011   Durwin Reges DPT Chinita Greenland, SPT Durwin Reges 06/21/2020, 11:49 AM  [place] [facility] [address], [place], [address] Phone: [phone]   Fax:  [phone]  Name: Howard [name] MRN: [id number] Date of Birth: Dec 31, 1930

## 2020-06-21 NOTE — Progress Notes (Signed)
Radiation Oncology Follow up Note  Name: Howard [name]   Date:   06/21/2020 MRN:  [id number] DOB: 13-Mar-1931    This 86 y.o. male presents to the clinic today for follow-up for PET scan results of a new lesion in his right lower lobe we are tracking for possible malignancy.  REFERRING PROVIDER: Margo Common, PA  HPI: Patient is an 86 year old male now out close to 5 years status post SBRT to his right upper middle lobe for stage I adenocarcinoma we have been tracking a right lower lobe lesion.  Slightly increasing in size last measuring 1.6 cm on CT scan back in early June.  We did a PET CT scan which shows mild hypermetabolic activity although SUV is less than 3.  He remains asymptomatic.  All other areas of the chest appear stable.  He is asymptomatic.  COMPLICATIONS OF TREATMENT: none  FOLLOW UP COMPLIANCE: keeps appointments   PHYSICAL EXAM:  BP 135/73 (BP Location: Left Arm, Patient Position: Sitting, Cuff Size: Normal)   Pulse 67   Temp 98.1 F (36.7 C) (Tympanic)   Resp 16   Wt 192 lb (87.1 kg)   BMI 28.35 kg/m  Well-developed well-nourished patient in NAD. HEENT reveals PERLA, EOMI, discs not visualized.  Oral cavity is clear. No oral mucosal lesions are identified. Neck is clear without evidence of cervical or supraclavicular adenopathy. Lungs are clear to A&P. Cardiac examination is essentially unremarkable with regular rate and rhythm without murmur rub or thrill. Abdomen is benign with no organomegaly or masses noted. Motor sensory and DTR levels are equal and symmetric in the upper and lower extremities. Cranial nerves II through XII are grossly intact. Proprioception is intact. No peripheral adenopathy or edema is identified. No motor or sensory levels are noted. Crude visual fields are within normal range.  RADIOLOGY RESULTS: PET CT scan reviewed compatible with above-stated findings  PLAN: At this time I believe we can continue tracking this lesion I have requested  a repeat CT scan in 3 to 4 months should the lesion be growing would opt at that time for SBRT again 5 fractions to 6000 cGy.  Risks and benefits of that treatment including its extremely low side effect profile were discussed with the patient and his wife.  They are both satisfied with my recommendations.  Appointments for PET scan and follow-up were arranged.  I would like to take this opportunity to thank you for allowing me to participate in the care of your patient.Noreene Filbert, MD

## 2020-06-23 ENCOUNTER — Ambulatory Visit: Payer: Medicare Other | Attending: Family Medicine | Admitting: Physical Therapy

## 2020-06-23 ENCOUNTER — Other Ambulatory Visit: Payer: Self-pay

## 2020-06-23 ENCOUNTER — Encounter: Payer: Self-pay | Admitting: Physical Therapy

## 2020-06-23 DIAGNOSIS — R262 Difficulty in walking, not elsewhere classified: Secondary | ICD-10-CM

## 2020-06-23 DIAGNOSIS — M6281 Muscle weakness (generalized): Secondary | ICD-10-CM | POA: Insufficient documentation

## 2020-06-23 DIAGNOSIS — R296 Repeated falls: Secondary | ICD-10-CM | POA: Insufficient documentation

## 2020-06-23 NOTE — Therapy (Signed)
[facility] [address] [place], [address] Phone: [phone]   Fax:  [phone]  Physical Therapy Treatment  Patient Details  Name: Howard [name] MRN: [id number] Date of Birth: 10/17/1931 No data recorded  Encounter Date: 06/23/2020   PT End of Session - 06/23/20 0921    Visit Number 8    Number of Visits 17    Date for PT Re-Evaluation 07/19/20    Authorization - Visit Number 8    Authorization - Number of Visits 10    PT Start Time 0915    PT Stop Time 1000    PT Time Calculation (min) 45 min    Equipment Utilized During Treatment Gait belt    Activity Tolerance Patient tolerated treatment well    Behavior During Therapy [facility] for tasks assessed/performed           Past Medical History:  Diagnosis Date  . Arthritis   . Bell palsy   . Cancer [facility])    prostate and skin  . Chronic combined systolic and diastolic CHF, NYHA class 1 ([place])    a. 07/2014 Echo: EF 35-40%, Gr 1 DD.  Marland Kitchen Complete heart block ([place])    a. 11/2010 s/p SJM 2210 [employer] DC PPM, ser# [id number].  . Depression   . Diabetes mellitus without complication ([place])   . Fall 11-10-14  . GERD (gastroesophageal reflux disease)   . History of prostate cancer   . Hyperlipidemia   . Hypertension   . LBBB (left bundle branch block)   . Left-sided Bell's palsy   . Lung cancer ([place]) 2016  . NICM (nonischemic cardiomyopathy) ([place])    a. 07/2014 Echo: EF 35-40%, mid-apicalanteroseptal DK, Gr 1 DD, mild-mod dil LA.  . Non-obstructive CAD    a. 07/2014 Abnl MV;  b. 08/2014 Cath: LM nl, LAD 30p, RI 40p, LCX nl, OM1 40, RCA dominant 30p, 70d-->Med Rx.  Marland Kitchen Poor balance   . Presence of permanent cardiac pacemaker   . Sleep apnea    a. cpap  . Vertigo   . WPW (Wolff-Parkinson-White syndrome)    a. S/P RFCA 1991.    Past Surgical History:  Procedure Laterality Date  . ABDOMINAL AORTIC ENDOVASCULAR STENT GRAFT  08/25/2019   Procedure: ABDOMINAL AORTIC  ENDOVASCULAR STENT GRAFT;  Surgeon: Algernon Huxley, MD;  Location: [facility] ORS;  Service: Vascular;;  . ANGIOPLASTY Left 08/25/2019   Procedure: ANGIOPLASTY;  Surgeon: Algernon Huxley, MD;  Location: [facility] ORS;  Service: Vascular;  Laterality: Left;  left SFA and stent placement  . APPLICATION OF WOUND VAC Left 06/07/2015   Procedure: APPLICATION OF WOUND VAC;  Surgeon: Robert Bellow, MD;  Location: [facility] ORS;  Service: General;  Laterality: Left;  left upper back  . BACK SURGERY     2011  . CARDIAC CATHETERIZATION  08/26/2014   Single vessel obstructive CAD  . CARPAL TUNNEL RELEASE  04-04-15   [facility]  . CATARACT EXTRACTION  07-31-11 and 09-18-11  . Catheter ablation  1991   for WPW  . cervical fusion    . CHOLECYSTECTOMY  09-07-14  . ENDARTERECTOMY FEMORAL Left 08/25/2019   Procedure: ENDARTERECTOMY FEMORAL;  Surgeon: Algernon Huxley, MD;  Location: [facility] ORS;  Service: Vascular;  Laterality: Left;  common and produndis   . ENDOVASCULAR REPAIR/STENT GRAFT Right 08/25/2019   Procedure: ENDOVASCULAR REPAIR/STENT GRAFT;  Surgeon: Algernon Huxley, MD;  Location: [facility] ORS;  Service: Vascular;  Laterality: Right;  renal artery  . HAND SURGERY     right 1993; left 2005  . HERNIA REPAIR  1955  . INSERT / REPLACE / REMOVE PACEMAKER    . INSERTION OF ILIAC STENT Bilateral 08/25/2019   Procedure: INSERTION OF ILIAC STENT;  Surgeon: Algernon Huxley, MD;  Location: [facility] ORS;  Service: Vascular;  Laterality: Bilateral;  . JOINT REPLACEMENT Left 2013   knee  . JOINT REPLACEMENT Right 2004   knee  . KNEE SURGERY     left knee 1991 and 1992; right knee 1995  . LEFT HEART CATHETERIZATION WITH CORONARY ANGIOGRAM N/A 08/26/2014   Procedure: LEFT HEART CATHETERIZATION WITH CORONARY ANGIOGRAM;  Surgeon: [name], MD;  Location: [facility] CATH LAB;  Service: Cardiovascular;  Laterality: N/A;  . LOWER EXTREMITY ANGIOGRAPHY Left 08/23/2019   Procedure: Lower Extremity Angiography;  Surgeon: Algernon Huxley, MD;  Location: [facility] CV LAB;   Service: Cardiovascular;  Laterality: Left;  . LUMBAR LAMINECTOMY/DECOMPRESSION MICRODISCECTOMY N/A 06/07/2014   Procedure: LUMBAR FOUR TO FIVE LUMBAR LAMINECTOMY/DECOMPRESSION MICRODISCECTOMY 1 LEVEL;  Surgeon: [name], MD;  Location: [place] NEURO ORS;  Service: Neurosurgery;  Laterality: N/A;  . LUNG BIOPSY Right 2016   Dr [name]  . PACEMAKER INSERTION     PPM-- St Jude 11/30/10 by [name]  . PPM GENERATOR CHANGEOUT N/A 07/09/2019   Procedure: PPM GENERATOR CHANGEOUT;  Surgeon: Deboraha Sprang, MD;  Location: [place] CV LAB;  Service: Cardiovascular;  Laterality: N/A;  . PROSTATE SURGERY     cancer--1998, prostatectomy  . REPLACEMENT TOTAL KNEE     2004  . ruptured disc     1962 and 1998  . TEE WITHOUT CARDIOVERSION N/A 09/01/2019   Procedure: TRANSESOPHAGEAL ECHOCARDIOGRAM (TEE);  Surgeon: [name], MD;  Location: [facility] ORS;  Service: Cardiovascular;  Laterality: N/A;  . TEMPORARY PACEMAKER N/A 07/09/2019   Procedure: TEMPORARY PACEMAKER;  Surgeon: Deboraha Sprang, MD;  Location: [place] CV LAB;  Service: Cardiovascular;  Laterality: N/A;  . TRIGGER FINGER RELEASE  01-24-15  . WOUND DEBRIDEMENT Left 06/07/2015   Procedure: DEBRIDEMENT WOUND;  Surgeon: Robert Bellow, MD;  Location: [facility] ORS;  Service: General;  Laterality: Left;  left upper back    There were no vitals filed for this visit.   Subjective Assessment - 06/23/20 0920    Subjective Pt reports PET scan was not positive, and will delay radiation treatments and check it again in a few months.  Pt reports no pain this morning.    Pertinent History Pt is a 86 y/o male familiar with this clinic, that presents to therapy with gait and strength deficits since vascular surgery in Sept 2020. Patient had difficulty ambulating prior to this for a "few months", and eventually was hospitalized for cellulitis of his L toe d/t decreased blood flow. Following vascular surgery in Sept 2020, patient had HHPT for 6 weeks and was doing  "pretty well". Reports over the month he has had insideous onset of increased L foot swelling and decreased balance with gait. He notes that his swelling is down today (visably not swollen), but that his feet have been swelling at night, and that is made somewhat better by propping it up; denies any pain. Patient has been using a SPC for "years" but ambulates into clinic today with loftstrand cruch in [place], which he reports someone gave to him recently, and he thinks this is helping his balance. Patient has a history of DM2 with periphreal neuropathy in L  foot, reporting deminished sensation "all over" the L foot. Denies any falls in the past 6 months, but reports his wife thinks he is very unstable. Prior to surgery patient was walking daily, but reports he has not done this in some time. Would like to return to walking "a few times a week", decrease swelling in his L foot, and increase his balance as he does report he has to catch himself often when he is entering the 3 steps into his home (bilat handrails) and has been furniture walking recently. Pt denies N/V, B&B changes, unexplained weight fluctuation, saddle paresthesia, fever, night sweats, or unrelenting night pain at this time.    Limitations Lifting;Walking;House hold activities    How long can you sit comfortably? Unlimited    How long can you stand comfortably? unlmited    How long can you walk comfortably? 64mins    Patient Stated Goals Walk without having to grab onto things    Currently in Pain? No/denies    Pain Onset More than a month ago            TherEx -Nustep L3 seat setting 11 -Standing resisted hip abduction with GTB 2x10 B -Amb 373ft and 269ft with stepping over 6x3" hurdles at various points in the gym for weaving and obstacle navigation; with cueing for increased L step length with fatigue with good carry over -Sit to stand x10, x10 on foam with elevated seat height with foam,  -Lateral step over 3" hurdles 2x8 on foam  with intermittent UE support and cueing for increased step height and width for foot clearance with good carry over -Amb 195ft in clinic with cueing for increased L step length with good carry over     PT Education - 06/23/20 0921    Education Details Therex form/technique; gait mechanics    Person(s) Educated Patient    Methods Explanation;Demonstration;Verbal cues    Comprehension Verbalized understanding;Returned demonstration;Verbal cues required            PT Short Term Goals - 05/24/20 1237      PT SHORT TERM GOAL #1   Title Pt will be independent with HEP in order to improve strength and balance in order to decrease fall risk and improve function at home and work.    Baseline 06/12/20 HEP given    Time 4    Period Weeks    Status New      PT SHORT TERM GOAL #2   Title Pt will improve BERG by at least 3 points in order to demonstrate clinically significant improvement in balance.    Baseline 05/24/20 38/56    Time 5    Period Weeks    Status New      PT SHORT TERM GOAL #3   Title Pt will improve DGI by at least 3 points in order to demonstrate clinically significant improvement in balance and decreased risk for falls.    Baseline 05/24/20 16/24    Time 5    Status New             PT Long Term Goals - 05/24/20 1247      PT LONG TERM GOAL #1   Title Pt will demonstrate gait speed of 1.90m/s or faster to demonstrate normal community walk speed    Baseline 05/24/20 0.50m/s    Time 8    Period Weeks    Status New      PT LONG TERM GOAL #2   Title Pt will demonstrate  FGA score of at least 22/30 to demonstrate least fall risk in community    Baseline 05/24/20 13/30    Time 8    Period Weeks    Status New      PT LONG TERM GOAL #3   Title Pt will demonstrate BERG score of at least 46/56 to demonstrate decreased community fall risk    Baseline 05/24/20 38/56    Time 8    Period Weeks    Status New      PT LONG TERM GOAL #4   Title Pt will decrease 5TSTS to at least  14.8 seconds in order to demonstrate clinically significant improvement and age matched norm in LE strength.    Baseline 05/24/20 18sec    Time 8    Period Weeks    Status New      PT LONG TERM GOAL #5   Title Pt will decrease TUG to below 12 seconds in order to demonstrate decreased fall risk.    Baseline 05/24/20 14sec    Time 8    Period Weeks                 Plan - 06/23/20 0950    Clinical Impression Statement PT continued therex for dynamic balance with good success.  Pt requires supervision with dynamic balance therex wtih no LOB.  Pt able to amb 375ft with safe navigation of obstacles before needing a rest break.  Pt with good carry over of cueing to maintain LLE step length equal to RLE with fatigue.  PT will continue progression as able.    Personal Factors and Comorbidities Age;Comorbidity 1;Comorbidity 3+;Comorbidity 2;Past/Current Experience;Fitness    Comorbidities DM2 with periphreal nueropathy, GERD, HTN, HLD, past lung cancer, arthritis, vertigo, PAD    Examination-Activity Limitations Stairs;Carry;Squat;Locomotion Level;Transfers    Examination-Participation Restrictions Cleaning;Yard Work;Community Activity;Church    Stability/Clinical Decision Making Evolving/Moderate complexity    Clinical Decision Making Moderate    Rehab Potential Good    Clinical Impairments Affecting Rehab Potential (+) social support, motivation (-) age, chronicity of pain, other comorbidities, multiple failed attempts at pain relief (injection, sx, past PT)    PT Frequency 2x / week    PT Duration 8 weeks    PT Treatment/Interventions ADLs/Self Care Home Management;Electrical Stimulation;Therapeutic activities;Patient/family education;Therapeutic exercise;DME Instruction;Gait training;Stair training;Moist Heat;Cryotherapy;Ultrasound;Aquatic Therapy;Functional mobility training;Manual techniques;Dry needling;Passive range of motion;Neuromuscular re-education;Balance training;Vestibular;Joint  Manipulations;Spinal Manipulations;Energy conservation    PT Next Visit Plan dynamic balance, gait mechanics/endurance    PT Home Exercise Plan STS, standing hip abd with GTB, stagger stance    Consulted and Agree with Plan of Care Patient           Patient will benefit from skilled therapeutic intervention in order to improve the following deficits and impairments:  Abnormal gait, Dizziness, Impaired sensation, Improper body mechanics, Postural dysfunction, Decreased mobility, Decreased coordination, Decreased activity tolerance, Decreased endurance, Decreased strength, Decreased balance, Difficulty walking  Visit Diagnosis: Difficulty in walking, not elsewhere classified  Muscle weakness (generalized)  Repeated falls     Problem List Patient Active Problem List   Diagnosis Date Noted  . AAA (abdominal aortic aneurysm) without rupture ([place]) 12/10/2019  . Renal artery stenosis ([place]) 12/10/2019  . PAD (peripheral artery disease) ([place]) 12/10/2019  . Thromboembolism ([place]) 08/30/2019  . Atherosclerosis of native arteries of extremity with intermittent claudication ([place]) 08/23/2019  . Cellulitis 08/22/2019  . Swelling of limb 08/17/2019  . Pain in limb 08/17/2019  . NICM (nonischemic cardiomyopathy) ([place]) 07/01/2019  .  CHF (congestive heart failure) ([place]) 07/01/2019  . Malignant neoplasm of right lung ([place]) 04/18/2016  . CAD (coronary artery disease) 04/01/2016  . Adenocarcinoma ([place]) 04/01/2016  . Skin cyst 12/26/2015  . GERD (gastroesophageal reflux disease) 06/16/2015  . Abscess of back 06/06/2015  . Vertigo 03/27/2015  . Carpal tunnel syndrome 10/28/2014  . Status post cholecystectomy 09/29/2014  . Disease of digestive tract 09/29/2014  . Cardiomyopathy ([place]) 09/26/2014  . Type 2 diabetes mellitus without complications ([place])   . Sleep apnea   . Spinal stenosis, lumbar region, with neurogenic claudication 06/07/2014  . Lumbar stenosis with neurogenic claudication  06/07/2014  . Abnormal gait 08/20/2012  . H/O total knee replacement 08/20/2012  . Arthritis of knee, degenerative 08/20/2012  . Pacemaker-St.Jude 08/03/2012  . Cardiac conduction disorder 06/19/2012  . Acid reflux 06/18/2012  . 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 Triggering of digit 03/25/2012  . Hyperlipidemia 12/23/2011  . Essential hypertension 03/23/2011  . Atrioventricular block, complete ([place]) 03/23/2011  . Complete atrioventricular block ([place]) 03/23/2011   Durwin Reges DPT Chinita Greenland, SPT Durwin Reges 06/23/2020, 10:56 AM  [facility] [address] [place], [address] Phone: [phone]   Fax:  [phone]  Name: Howard [name] MRN: [id number] Date of Birth: 06/15/1931

## 2020-06-27 ENCOUNTER — Ambulatory Visit: Payer: Medicare Other | Admitting: Physical Therapy

## 2020-06-27 ENCOUNTER — Encounter: Payer: Self-pay | Admitting: Physical Therapy

## 2020-06-27 ENCOUNTER — Other Ambulatory Visit: Payer: Self-pay

## 2020-06-27 DIAGNOSIS — R262 Difficulty in walking, not elsewhere classified: Secondary | ICD-10-CM

## 2020-06-27 DIAGNOSIS — R296 Repeated falls: Secondary | ICD-10-CM

## 2020-06-27 DIAGNOSIS — M6281 Muscle weakness (generalized): Secondary | ICD-10-CM | POA: Diagnosis not present

## 2020-06-27 NOTE — Therapy (Signed)
[facility] [address], [place], [address] Phone: [phone]   Fax:  [phone]  Physical Therapy Treatment  Patient Details  Name: Howard [name] MRN: [id number] Date of Birth: May 27, 1931 No data recorded  Encounter Date: 06/27/2020   PT End of Session - 06/27/20 0907    Visit Number 9    Number of Visits 17    Date for PT Re-Evaluation 07/19/20    Authorization - Visit Number 9    Authorization - Number of Visits 10    PT Start Time 0900    PT Stop Time 0945    PT Time Calculation (min) 45 min    Equipment Utilized During Treatment Gait belt    Activity Tolerance Patient tolerated treatment well    Behavior During Therapy [facility] for tasks assessed/performed           Past Medical History:  Diagnosis Date  . Arthritis   . Bell palsy   . Cancer [facility])    prostate and skin  . Chronic combined systolic and diastolic CHF, NYHA class 1 ([place])    a. 07/2014 Echo: EF 35-40%, Gr 1 DD.  Marland Kitchen Complete heart block ([place])    a. 11/2010 s/p SJM 2210 [employer] DC PPM, ser# [id number].  . Depression   . Diabetes mellitus without complication ([place])   . Fall 11-10-14  . GERD (gastroesophageal reflux disease)   . History of prostate cancer   . Hyperlipidemia   . Hypertension   . LBBB (left bundle branch block)   . Left-sided Bell's palsy   . Lung cancer ([place]) 2016  . NICM (nonischemic cardiomyopathy) ([place])    a. 07/2014 Echo: EF 35-40%, mid-apicalanteroseptal DK, Gr 1 DD, mild-mod dil LA.  . Non-obstructive CAD    a. 07/2014 Abnl MV;  b. 08/2014 Cath: LM nl, LAD 30p, RI 40p, LCX nl, OM1 40, RCA dominant 30p, 70d-->Med Rx.  Marland Kitchen Poor balance   . Presence of permanent cardiac pacemaker   . Sleep apnea    a. cpap  . Vertigo   . WPW (Wolff-Parkinson-White syndrome)    a. S/P RFCA 1991.    Past Surgical History:  Procedure Laterality Date  . ABDOMINAL AORTIC ENDOVASCULAR STENT GRAFT  08/25/2019   Procedure: ABDOMINAL AORTIC  ENDOVASCULAR STENT GRAFT;  Surgeon: Algernon Huxley, MD;  Location: [facility] ORS;  Service: Vascular;;  . ANGIOPLASTY Left 08/25/2019   Procedure: ANGIOPLASTY;  Surgeon: Algernon Huxley, MD;  Location: [facility] ORS;  Service: Vascular;  Laterality: Left;  left SFA and stent placement  . APPLICATION OF WOUND VAC Left 06/07/2015   Procedure: APPLICATION OF WOUND VAC;  Surgeon: Robert Bellow, MD;  Location: [facility] ORS;  Service: General;  Laterality: Left;  left upper back  . BACK SURGERY     2011  . CARDIAC CATHETERIZATION  08/26/2014   Single vessel obstructive CAD  . CARPAL TUNNEL RELEASE  04-04-15   [facility]  . CATARACT EXTRACTION  07-31-11 and 09-18-11  . Catheter ablation  1991   for WPW  . cervical fusion    . CHOLECYSTECTOMY  09-07-14  . ENDARTERECTOMY FEMORAL Left 08/25/2019   Procedure: ENDARTERECTOMY FEMORAL;  Surgeon: Algernon Huxley, MD;  Location: [facility] ORS;  Service: Vascular;  Laterality: Left;  common and produndis   . ENDOVASCULAR REPAIR/STENT GRAFT Right 08/25/2019   Procedure: ENDOVASCULAR REPAIR/STENT GRAFT;  Surgeon: Algernon Huxley, MD;  Location: [facility] ORS;  Service: Vascular;  Laterality: Right;  renal artery  . HAND SURGERY     right 1993; left 2005  . HERNIA REPAIR  1955  . INSERT / REPLACE / REMOVE PACEMAKER    . INSERTION OF ILIAC STENT Bilateral 08/25/2019   Procedure: INSERTION OF ILIAC STENT;  Surgeon: Algernon Huxley, MD;  Location: [facility] ORS;  Service: Vascular;  Laterality: Bilateral;  . JOINT REPLACEMENT Left 2013   knee  . JOINT REPLACEMENT Right 2004   knee  . KNEE SURGERY     left knee 1991 and 1992; right knee 1995  . LEFT HEART CATHETERIZATION WITH CORONARY ANGIOGRAM N/A 08/26/2014   Procedure: LEFT HEART CATHETERIZATION WITH CORONARY ANGIOGRAM;  Surgeon: [name], MD;  Location: [facility] CATH LAB;  Service: Cardiovascular;  Laterality: N/A;  . LOWER EXTREMITY ANGIOGRAPHY Left 08/23/2019   Procedure: Lower Extremity Angiography;  Surgeon: Algernon Huxley, MD;  Location: [place] CV LAB;   Service: Cardiovascular;  Laterality: Left;  . LUMBAR LAMINECTOMY/DECOMPRESSION MICRODISCECTOMY N/A 06/07/2014   Procedure: LUMBAR FOUR TO FIVE LUMBAR LAMINECTOMY/DECOMPRESSION MICRODISCECTOMY 1 LEVEL;  Surgeon: [name], MD;  Location: [place] NEURO ORS;  Service: Neurosurgery;  Laterality: N/A;  . LUNG BIOPSY Right 2016   Dr [name]  . PACEMAKER INSERTION     PPM-- St Jude 11/30/10 by [name]  . PPM GENERATOR CHANGEOUT N/A 07/09/2019   Procedure: PPM GENERATOR CHANGEOUT;  Surgeon: Deboraha Sprang, MD;  Location: [place] CV LAB;  Service: Cardiovascular;  Laterality: N/A;  . PROSTATE SURGERY     cancer--1998, prostatectomy  . REPLACEMENT TOTAL KNEE     2004  . ruptured disc     1962 and 1998  . TEE WITHOUT CARDIOVERSION N/A 09/01/2019   Procedure: TRANSESOPHAGEAL ECHOCARDIOGRAM (TEE);  Surgeon: [name], MD;  Location: [facility] ORS;  Service: Cardiovascular;  Laterality: N/A;  . TEMPORARY PACEMAKER N/A 07/09/2019   Procedure: TEMPORARY PACEMAKER;  Surgeon: Deboraha Sprang, MD;  Location: [place] CV LAB;  Service: Cardiovascular;  Laterality: N/A;  . TRIGGER FINGER RELEASE  01-24-15  . WOUND DEBRIDEMENT Left 06/07/2015   Procedure: DEBRIDEMENT WOUND;  Surgeon: Robert Bellow, MD;  Location: [facility] ORS;  Service: General;  Laterality: Left;  left upper back    There were no vitals filed for this visit.   Subjective Assessment - 06/27/20 0905    Subjective Pt reports no pain today. Pt reports having some difficulty this morning putting on his shoes d/t bilat foot/ankle swelling.    Pertinent History Pt is a 86 y/o male familiar with this clinic, that presents to therapy with gait and strength deficits since vascular surgery in Sept 2020. Patient had difficulty ambulating prior to this for a "few months", and eventually was hospitalized for cellulitis of his L toe d/t decreased blood flow. Following vascular surgery in Sept 2020, patient had HHPT for 6 weeks and was doing "pretty well". Reports  over the month he has had insideous onset of increased L foot swelling and decreased balance with gait. He notes that his swelling is down today (visably not swollen), but that his feet have been swelling at night, and that is made somewhat better by propping it up; denies any pain. Patient has been using a SPC for "years" but ambulates into clinic today with loftstrand cruch in [place], which he reports someone gave to him recently, and he thinks this is helping his balance. Patient has a history of DM2 with periphreal neuropathy in L foot, reporting deminished sensation "all over" the  L foot. Denies any falls in the past 6 months, but reports his wife thinks he is very unstable. Prior to surgery patient was walking daily, but reports he has not done this in some time. Would like to return to walking "a few times a week", decrease swelling in his L foot, and increase his balance as he does report he has to catch himself often when he is entering the 3 steps into his home (bilat handrails) and has been furniture walking recently. Pt denies N/V, B&B changes, unexplained weight fluctuation, saddle paresthesia, fever, night sweats, or unrelenting night pain at this time.    Limitations Lifting;Walking;House hold activities    How long can you sit comfortably? Unlimited    How long can you stand comfortably? unlmited    How long can you walk comfortably? 34mins    Patient Stated Goals Walk without having to grab onto things    Currently in Pain? No/denies    Pain Onset More than a month ago            TherEx -Nustep L3 seat setting 11 -Lateral step over 2x3" hurdles 2x10 on foam with intermittent UE support -Standing ball catch and toss on foam 2x1 min with supervision for safety and sitting rest break between, with cueing to catch ball with BUE to increase forward reach and dynamic balance perturbation -Standing ball kick 2x10 B with supervision for safety, and increased reach for ball to challenge SL  dynamic balance  -Amb 25ft and 169ft with stepping over 2x3" hurdles onto foam for obstacle navigation on unstable surfaces, cueing for increased L step length with fatigue with good carry over     PT Education - 06/27/20 0906    Education Details Therex form/technique; gait mechanics    Person(s) Educated Patient    Methods Explanation;Demonstration;Verbal cues    Comprehension Verbalized understanding;Returned demonstration;Verbal cues required            PT Short Term Goals - 05/24/20 1237      PT SHORT TERM GOAL #1   Title Pt will be independent with HEP in order to improve strength and balance in order to decrease fall risk and improve function at home and work.    Baseline 06/12/20 HEP given    Time 4    Period Weeks    Status New      PT SHORT TERM GOAL #2   Title Pt will improve BERG by at least 3 points in order to demonstrate clinically significant improvement in balance.    Baseline 05/24/20 38/56    Time 5    Period Weeks    Status New      PT SHORT TERM GOAL #3   Title Pt will improve DGI by at least 3 points in order to demonstrate clinically significant improvement in balance and decreased risk for falls.    Baseline 05/24/20 16/24    Time 5    Status New             PT Long Term Goals - 05/24/20 1247      PT LONG TERM GOAL #1   Title Pt will demonstrate gait speed of 1.60m/s or faster to demonstrate normal community walk speed    Baseline 05/24/20 0.78m/s    Time 8    Period Weeks    Status New      PT LONG TERM GOAL #2   Title Pt will demonstrate FGA score of at least 22/30 to demonstrate least fall  risk in community    Baseline 05/24/20 13/30    Time 8    Period Weeks    Status New      PT LONG TERM GOAL #3   Title Pt will demonstrate BERG score of at least 46/56 to demonstrate decreased community fall risk    Baseline 05/24/20 38/56    Time 8    Period Weeks    Status New      PT LONG TERM GOAL #4   Title Pt will decrease 5TSTS to at least  14.8 seconds in order to demonstrate clinically significant improvement and age matched norm in LE strength.    Baseline 05/24/20 18sec    Time 8    Period Weeks    Status New      PT LONG TERM GOAL #5   Title Pt will decrease TUG to below 12 seconds in order to demonstrate decreased fall risk.    Baseline 05/24/20 14sec    Time 8    Period Weeks                 Plan - 06/27/20 0956    Clinical Impression Statement PT continued therex for dynamic balance with good success and no increase in pain.  Pt requires supervision throughout dynamic balance therex for safety; no LOB or increase in pain.  Pt with reports of cramping in BLEs after 25ft of ambulation with intermittent dynamic balance challenges; relieves with sitting rest break.  PT will reassess goals next session and discuss potential discharge.    Personal Factors and Comorbidities Age;Comorbidity 1;Comorbidity 3+;Comorbidity 2;Past/Current Experience;Fitness    Comorbidities DM2 with periphreal nueropathy, GERD, HTN, HLD, past lung cancer, arthritis, vertigo, PAD    Examination-Activity Limitations Stairs;Carry;Squat;Locomotion Level;Transfers    Examination-Participation Restrictions Cleaning;Yard Work;Community Activity;Church    Stability/Clinical Decision Making Evolving/Moderate complexity    Clinical Decision Making Moderate    Rehab Potential Good    Clinical Impairments Affecting Rehab Potential (+) social support, motivation (-) age, chronicity of pain, other comorbidities, multiple failed attempts at pain relief (injection, sx, past PT)    PT Frequency 2x / week    PT Duration 8 weeks    PT Treatment/Interventions ADLs/Self Care Home Management;Electrical Stimulation;Therapeutic activities;Patient/family education;Therapeutic exercise;DME Instruction;Gait training;Stair training;Moist Heat;Cryotherapy;Ultrasound;Aquatic Therapy;Functional mobility training;Manual techniques;Dry needling;Passive range of  motion;Neuromuscular re-education;Balance training;Vestibular;Joint Manipulations;Spinal Manipulations;Energy conservation    PT Next Visit Plan dynamic balance, gait mechanics/endurance    PT Home Exercise Plan STS, standing hip abd with GTB, stagger stance    Consulted and Agree with Plan of Care Patient           Patient will benefit from skilled therapeutic intervention in order to improve the following deficits and impairments:  Abnormal gait, Dizziness, Impaired sensation, Improper body mechanics, Postural dysfunction, Decreased mobility, Decreased coordination, Decreased activity tolerance, Decreased endurance, Decreased strength, Decreased balance, Difficulty walking  Visit Diagnosis: Difficulty in walking, not elsewhere classified  Muscle weakness (generalized)  Repeated falls     Problem List Patient Active Problem List   Diagnosis Date Noted  . AAA (abdominal aortic aneurysm) without rupture ([place]) 12/10/2019  . Renal artery stenosis ([place]) 12/10/2019  . PAD (peripheral artery disease) ([place]) 12/10/2019  . Thromboembolism ([place]) 08/30/2019  . Atherosclerosis of native arteries of extremity with intermittent claudication ([place]) 08/23/2019  . Cellulitis 08/22/2019  . Swelling of limb 08/17/2019  . Pain in limb 08/17/2019  . NICM (nonischemic cardiomyopathy) ([place]) 07/01/2019  . CHF (congestive heart failure) ([place]) 07/01/2019  .  Malignant neoplasm of right lung ([place]) 04/18/2016  . CAD (coronary artery disease) 04/01/2016  . Adenocarcinoma ([place]) 04/01/2016  . Skin cyst 12/26/2015  . GERD (gastroesophageal reflux disease) 06/16/2015  . Abscess of back 06/06/2015  . Vertigo 03/27/2015  . Carpal tunnel syndrome 10/28/2014  . Status post cholecystectomy 09/29/2014  . Disease of digestive tract 09/29/2014  . Cardiomyopathy ([place]) 09/26/2014  . Type 2 diabetes mellitus without complications ([place])   . Sleep apnea   . Spinal stenosis, lumbar region, with neurogenic claudication  06/07/2014  . Lumbar stenosis with neurogenic claudication 06/07/2014  . Abnormal gait 08/20/2012  . H/O total knee replacement 08/20/2012  . Arthritis of knee, degenerative 08/20/2012  . Pacemaker-St.Jude 08/03/2012  . Cardiac conduction disorder 06/19/2012  . Acid reflux 06/18/2012  . 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 Triggering of digit 03/25/2012  . Hyperlipidemia 12/23/2011  . Essential hypertension 03/23/2011  . Atrioventricular block, complete ([place]) 03/23/2011  . Complete atrioventricular block ([place]) 03/23/2011   Durwin Reges DPT Chinita Greenland, SPT Durwin Reges 06/27/2020, 11:11 AM  [place] [facility] [address] [place], [address] Phone: [phone]   Fax:  [phone]  Name: Howard [name] MRN: [id number] Date of Birth: [birth year]

## 2020-06-30 ENCOUNTER — Ambulatory Visit: Payer: Medicare Other | Admitting: Physical Therapy

## 2020-06-30 ENCOUNTER — Encounter: Payer: Self-pay | Admitting: Physical Therapy

## 2020-06-30 ENCOUNTER — Other Ambulatory Visit: Payer: Self-pay

## 2020-06-30 DIAGNOSIS — R296 Repeated falls: Secondary | ICD-10-CM | POA: Diagnosis not present

## 2020-06-30 DIAGNOSIS — R262 Difficulty in walking, not elsewhere classified: Secondary | ICD-10-CM | POA: Diagnosis not present

## 2020-06-30 DIAGNOSIS — M6281 Muscle weakness (generalized): Secondary | ICD-10-CM

## 2020-06-30 NOTE — Therapy (Signed)
[place] [facility] PHYSICAL AND SPORTS MEDICINE [address], [place], [address] Phone: [phone]   Fax:  [phone]  Physical Therapy Treatment/Discharge Summary Reporting Period 05/24/20 - 06/30/20  Patient Details  Name: Howard [name] MRN: [id number] Date of Birth: 1931/03/24 No data recorded  Encounter Date: 06/30/2020   PT End of Session - 06/30/20 0849    Visit Number 10    Number of Visits 17    Date for PT Re-Evaluation 07/19/20    Authorization - Visit Number 10    Authorization - Number of Visits 10    PT Start Time 0845    PT Stop Time 0930    PT Time Calculation (min) 45 min    Equipment Utilized During Treatment Gait belt    Activity Tolerance Patient tolerated treatment well    Behavior During Therapy [facility] for tasks assessed/performed           Past Medical History:  Diagnosis Date  . Arthritis   . Bell palsy   . Cancer [facility])    prostate and skin  . Chronic combined systolic and diastolic CHF, NYHA class 1 (HCC)    a. 07/2014 Echo: EF 35-40%, Gr 1 DD.  Marland Kitchen Complete heart block (HCC)    a. 11/2010 s/p SJM 2210 [employer] DC PPM, ser# [id number].  . Depression   . Diabetes mellitus without complication (HCC)   . Fall 11-10-14  . GERD (gastroesophageal reflux disease)   . History of prostate cancer   . Hyperlipidemia   . Hypertension   . LBBB (left bundle branch block)   . Left-sided Bell's palsy   . Lung cancer (HCC) 2016  . NICM (nonischemic cardiomyopathy) (HCC)    a. 07/2014 Echo: EF 35-40%, mid-apicalanteroseptal DK, Gr 1 DD, mild-mod dil LA.  . Non-obstructive CAD    a. 07/2014 Abnl MV;  b. 08/2014 Cath: LM nl, LAD 30p, RI 40p, LCX nl, OM1 40, RCA dominant 30p, 70d-->Med Rx.  Marland Kitchen Poor balance   . Presence of permanent cardiac pacemaker   . Sleep apnea    a. cpap  . Vertigo   . WPW (Wolff-Parkinson-White syndrome)    a. S/P RFCA 1991.    Past Surgical History:  Procedure Laterality Date  . ABDOMINAL AORTIC  ENDOVASCULAR STENT GRAFT  08/25/2019   Procedure: ABDOMINAL AORTIC ENDOVASCULAR STENT GRAFT;  Surgeon: Annice Needy, MD;  Location: [facility] ORS;  Service: Vascular;;  . ANGIOPLASTY Left 08/25/2019   Procedure: ANGIOPLASTY;  Surgeon: Annice Needy, MD;  Location: [facility] ORS;  Service: Vascular;  Laterality: Left;  left SFA and stent placement  . APPLICATION OF WOUND VAC Left 06/07/2015   Procedure: APPLICATION OF WOUND VAC;  Surgeon: Earline Mayotte, MD;  Location: [facility] ORS;  Service: General;  Laterality: Left;  left upper back  . BACK SURGERY     2011  . CARDIAC CATHETERIZATION  08/26/2014   Single vessel obstructive CAD  . CARPAL TUNNEL RELEASE  04-04-15   [facility]  . CATARACT EXTRACTION  07-31-11 and 09-18-11  . Catheter ablation  1991   for WPW  . cervical fusion    . CHOLECYSTECTOMY  09-07-14  . ENDARTERECTOMY FEMORAL Left 08/25/2019   Procedure: ENDARTERECTOMY FEMORAL;  Surgeon: Annice Needy, MD;  Location: [facility] ORS;  Service: Vascular;  Laterality: Left;  common and produndis   . ENDOVASCULAR REPAIR/STENT GRAFT Right 08/25/2019   Procedure: ENDOVASCULAR REPAIR/STENT GRAFT;  Surgeon: Annice Needy, MD;  Location: [facility] ORS;  Service: Vascular;  Laterality: Right;  renal artery  . HAND SURGERY     right 1993; left 2005  . HERNIA REPAIR  1955  . INSERT / REPLACE / REMOVE PACEMAKER    . INSERTION OF ILIAC STENT Bilateral 08/25/2019   Procedure: INSERTION OF ILIAC STENT;  Surgeon: Algernon Huxley, MD;  Location: [facility] ORS;  Service: Vascular;  Laterality: Bilateral;  . JOINT REPLACEMENT Left 2013   knee  . JOINT REPLACEMENT Right 2004   knee  . KNEE SURGERY     left knee 1991 and 1992; right knee 1995  . LEFT HEART CATHETERIZATION WITH CORONARY ANGIOGRAM N/A 08/26/2014   Procedure: LEFT HEART CATHETERIZATION WITH CORONARY ANGIOGRAM;  Surgeon: [name], MD;  Location: [facility] CATH LAB;  Service: Cardiovascular;  Laterality: N/A;  . LOWER EXTREMITY ANGIOGRAPHY Left 08/23/2019   Procedure: Lower Extremity  Angiography;  Surgeon: Algernon Huxley, MD;  Location: [place] CV LAB;  Service: Cardiovascular;  Laterality: Left;  . LUMBAR LAMINECTOMY/DECOMPRESSION MICRODISCECTOMY N/A 06/07/2014   Procedure: LUMBAR FOUR TO FIVE LUMBAR LAMINECTOMY/DECOMPRESSION MICRODISCECTOMY 1 LEVEL;  Surgeon: [name], MD;  Location: [place] NEURO ORS;  Service: Neurosurgery;  Laterality: N/A;  . LUNG BIOPSY Right 2016   Dr [name]  . PACEMAKER INSERTION     PPM-- St Jude 11/30/10 by [name]  . PPM GENERATOR CHANGEOUT N/A 07/09/2019   Procedure: PPM GENERATOR CHANGEOUT;  Surgeon: Deboraha Sprang, MD;  Location: [place] CV LAB;  Service: Cardiovascular;  Laterality: N/A;  . PROSTATE SURGERY     cancer--1998, prostatectomy  . REPLACEMENT TOTAL KNEE     2004  . ruptured disc     1962 and 1998  . TEE WITHOUT CARDIOVERSION N/A 09/01/2019   Procedure: TRANSESOPHAGEAL ECHOCARDIOGRAM (TEE);  Surgeon: [name], MD;  Location: [facility] ORS;  Service: Cardiovascular;  Laterality: N/A;  . TEMPORARY PACEMAKER N/A 07/09/2019   Procedure: TEMPORARY PACEMAKER;  Surgeon: Deboraha Sprang, MD;  Location: [place] CV LAB;  Service: Cardiovascular;  Laterality: N/A;  . TRIGGER FINGER RELEASE  01-24-15  . WOUND DEBRIDEMENT Left 06/07/2015   Procedure: DEBRIDEMENT WOUND;  Surgeon: Robert Bellow, MD;  Location: [facility] ORS;  Service: General;  Laterality: Left;  left upper back    There were no vitals filed for this visit.   Subjective Assessment - 06/30/20 0851    Subjective Pt reports no pain today.  Pt with decreased R step length and reduced L stance time entering clinic.  Pt with increased edema (grade 3+) in L ankle/foot this morning; pt unable to put on socks this morning d/t swelling.  Pt reports he does not feel pain in L ankle/foot d/t absent sensation.    Pertinent History Pt is a 86 y/o male familiar with this clinic, that presents to therapy with gait and strength deficits since vascular surgery in Sept 2020. Patient had  difficulty ambulating prior to this for a "few months", and eventually was hospitalized for cellulitis of his L toe d/t decreased blood flow. Following vascular surgery in Sept 2020, patient had HHPT for 6 weeks and was doing "pretty well". Reports over the month he has had insideous onset of increased L foot swelling and decreased balance with gait. He notes that his swelling is down today (visably not swollen), but that his feet have been swelling at night, and that is made somewhat better by propping it up; denies any pain. Patient has been using a SPC for "years" but ambulates into  clinic today with [place] cruch in [place], which he reports someone gave to him recently, and he thinks this is helping his balance. Patient has a history of DM2 with periphreal neuropathy in L foot, reporting deminished sensation "all over" the L foot. Denies any falls in the past 6 months, but reports his wife thinks he is very unstable. Prior to surgery patient was walking daily, but reports he has not done this in some time. Would like to return to walking "a few times a week", decrease swelling in his L foot, and increase his balance as he does report he has to catch himself often when he is entering the 3 steps into his home (bilat handrails) and has been furniture walking recently. Pt denies N/V, B&B changes, unexplained weight fluctuation, saddle paresthesia, fever, night sweats, or unrelenting night pain at this time.    Limitations Lifting;Walking;House hold activities    How long can you sit comfortably? Unlimited    How long can you stand comfortably? unlmited    How long can you walk comfortably? 62mns    Patient Stated Goals Walk without having to grab onto things    Currently in Pain? No/denies    Pain Onset More than a month ago              [facility] - 06/30/20 0001      Ambulation/Gait   Gait velocity 0.91      Balance   Balance Assessed Yes      Standardized Balance Assessment    Standardized Balance Assessment Berg Balance Test;Dynamic Gait Index;Timed Up and Go Test;Five Times Sit to Stand      Berg Balance Test   Sit to Stand Able to stand without using hands and stabilize independently    Standing Unsupported Able to stand safely 2 minutes    Sitting with Back Unsupported but Feet Supported on Floor or Stool Able to sit safely and securely 2 minutes    Stand to Sit Sits safely with minimal use of hands    Transfers Able to transfer safely, minor use of hands    Standing Unsupported with Eyes Closed Able to stand 10 seconds safely    Standing Unsupported with Feet Together Able to place feet together independently and stand 1 minute safely    From Standing, Reach Forward with Outstretched Arm Can reach confidently >25 cm (10")    From Standing Position, Pick up Object from Floor Able to pick up shoe safely and easily    From Standing Position, Turn to Look Behind Over each Shoulder Looks behind one side only/other side shows less weight shift    Turn 360 Degrees Able to turn 360 degrees safely in 4 seconds or less    Standing Unsupported, Alternately Place Feet on Step/Stool Able to stand independently and safely and complete 8 steps in 20 seconds    Standing Unsupported, One Foot in Front Able to take small step independently and hold 30 seconds    Standing on One Leg Able to lift leg independently and hold 5-10 seconds    Total Score 52      Dynamic Gait Index   Level Surface Normal    Change in Gait Speed Mild Impairment    Gait with Horizontal Head Turns Mild Impairment    Gait with Vertical Head Turns Mild Impairment    Gait and Pivot Turn Normal    Step Over Obstacle Normal    Step Around Obstacles Normal  Steps Mild Impairment    Total Score 20      Timed Up and Go Test   Normal TUG (seconds) 10      Functional Gait  Assessment   Gait assessed  Yes    Gait Level Surface Walks 20 ft in less than 7 sec but greater than 5.5 sec, uses assistive  device, slower speed, mild gait deviations, or deviates 6-10 in outside of the 12 in walkway width.    Change in Gait Speed Able to change speed, demonstrates mild gait deviations, deviates 6-10 in outside of the 12 in walkway width, or no gait deviations, unable to achieve a major change in velocity, or uses a change in velocity, or uses an assistive device.    Gait with Horizontal Head Turns Performs head turns smoothly with slight change in gait velocity (eg, minor disruption to smooth gait path), deviates 6-10 in outside 12 in walkway width, or uses an assistive device.    Gait with Vertical Head Turns Performs task with slight change in gait velocity (eg, minor disruption to smooth gait path), deviates 6 - 10 in outside 12 in walkway width or uses assistive device    Gait and Pivot Turn Pivot turns safely within 3 sec and stops quickly with no loss of balance.    Step Over Obstacle Is able to step over 2 stacked shoe boxes taped together (9 in total height) without changing gait speed. No evidence of imbalance.    Gait with Narrow Base of Support Ambulates 7-9 steps.    Gait with Eyes Closed Walks 20 ft, uses assistive device, slower speed, mild gait deviations, deviates 6-10 in outside 12 in walkway width. Ambulates 20 ft in less than 9 sec but greater than 7 sec.    Ambulating Backwards Walks 20 ft, uses assistive device, slower speed, mild gait deviations, deviates 6-10 in outside 12 in walkway width.    Steps Alternating feet, must use rail.    Total Score 22           TherEx  -Nustep L3 x5 min seat 10 -Balance assessed using Berg Balance Test [phone]) with supervision for safety and CGA with narrowed stance/SLB activities -Gait and balance assessed with FGA (22/30) and DGI (20/24) with supervision for safety  -LE strength/power assessed with 5xSTS (13 sec) with supervision for safety and cueing for appropriate understanding of assessment instructions  -Fall risk assessed with TUG  (10sec) with supervision for safety -Pt educated on HEP: standing hip abd, standing hip ext, modified tandem standing endurance, sit to stand - with demonstration and verbal cueing for correct form to isolate glute max/min and reduce lumbar leaning/flexion. PT educated patient on parameters of therex (how/when to inc/decrease intensity, frequency, rep/set range, stretch hold time, and purpose of therex) with verbalized understanding.  Hip Ext with band - 1 x daily - 3 x weekly - 3 sets - 10 reps Hip abd with band- 1 x daily - 3 x weekly - 3 sets - 10 reps STS- 1 x daily - 3 x weekly - 3 sets - 10 reps Tandem with counter support - 7 x weekly - 3 sets - 30-60sec hold      PT Education - 06/30/20 0950    Education Details HEP for self-management of strengthening/balance from home    Person(s) Educated Patient    Methods Explanation;Demonstration;Handout    Comprehension Verbalized understanding;Returned demonstration            PT Short Term Goals -  06/30/20 0951      PT SHORT TERM GOAL #1   Title Pt will be independent with HEP in order to improve strength and balance in order to decrease fall risk and improve function at home and work.    Baseline 06/30/20 Pt independent with HEP for self-management at home 06/12/20 HEP given    Time 4    Period Weeks    Status Achieved    Target Date 06/30/20      PT SHORT TERM GOAL #2   Title Pt will improve BERG by at least 3 points in order to demonstrate clinically significant improvement in balance.    Baseline 06/30/20 Berg 52/56; 05/24/20 38/56    Time 5    Period Weeks    Status Achieved    Target Date 06/30/20      PT SHORT TERM GOAL #3   Title Pt will improve DGI by at least 3 points in order to demonstrate clinically significant improvement in balance and decreased risk for falls.    Baseline 06/30/20 DGI 20/24; 05/24/20 16/24    Time 5    Status Achieved    Target Date 06/30/20             PT Long Term Goals - 06/30/20 0952       PT LONG TERM GOAL #1   Title Pt will demonstrate gait speed of 1.79ms or faster to demonstrate normal community walk speed    Baseline 06/30/20 Gait Speed 0.985m; 05/24/20 0.8822m   Time 8    Period Weeks    Status Not Met    Target Date 06/30/20      PT LONG TERM GOAL #2   Title Pt will demonstrate FGA score of at least 22/30 to demonstrate least fall risk in community    Baseline 06/30/20 FGA 22/30; 05/24/20 13/30    Time 8    Period Weeks    Status Achieved    Target Date 06/30/20      PT LONG TERM GOAL #3   Title Pt will demonstrate BERG score of at least 46/56 to demonstrate decreased community fall risk    Baseline 06/30/20 Berg 52/56; 05/24/20 38/56    Time 8    Period Weeks    Status Achieved    Target Date 06/30/20      PT LONG TERM GOAL #4   Title Pt will decrease 5TSTS to at least 14.8 seconds in order to demonstrate clinically significant improvement and age matched norm in LE strength.    Baseline 06/30/20 5xSTS 13 sec; 05/24/20 18sec    Time 8    Period Weeks    Status Achieved    Target Date 06/30/20      PT LONG TERM GOAL #5   Title Pt will decrease TUG to below 12 seconds in order to demonstrate decreased fall risk.    Baseline 06/30/20 TUG 10 sec; 05/24/20 14sec    Time 8    Period Weeks    Status Achieved    Target Date 06/30/20                 Plan - 06/30/20 0956    Clinical Impression Statement PT reassessed goals today with achievement of all balance goals indicating improved balance and reduced fall risk.  Pt gait speed is sufficient for community ambulation; pt expresses feeling 50% improved primarily from improved ability to ambulate.  Pt agrees to discharge with comprehensive HEP for continued self-management of condition from  home.    Personal Factors and Comorbidities Age;Comorbidity 1;Comorbidity 3+;Comorbidity 2;Past/Current Experience;Fitness    Comorbidities DM2 with periphreal nueropathy, GERD, HTN, HLD, past lung cancer, arthritis, vertigo, PAD     Examination-Activity Limitations Stairs;Carry;Squat;Locomotion Level;Transfers    Examination-Participation Restrictions Cleaning;Yard Work;Community Activity;Church    Stability/Clinical Decision Making Evolving/Moderate complexity    Clinical Decision Making Moderate    Rehab Potential Good    Clinical Impairments Affecting Rehab Potential (+) social support, motivation (-) age, chronicity of pain, other comorbidities, multiple failed attempts at pain relief (injection, sx, past PT)    PT Frequency 2x / week    PT Duration 8 weeks    PT Treatment/Interventions ADLs/Self Care Home Management;Electrical Stimulation;Therapeutic activities;Patient/family education;Therapeutic exercise;DME Instruction;Gait training;Stair training;Moist Heat;Cryotherapy;Ultrasound;Aquatic Therapy;Functional mobility training;Manual techniques;Dry needling;Passive range of motion;Neuromuscular re-education;Balance training;Vestibular;Joint Manipulations;Spinal Manipulations;Energy conservation    PT Next Visit Plan Pt discharged from PT    PT Home Exercise Plan STS, standing hip abd and hip extension with GTB, stagger stance    Consulted and Agree with Plan of Care Patient           Patient will benefit from skilled therapeutic intervention in order to improve the following deficits and impairments:  Abnormal gait, Dizziness, Impaired sensation, Improper body mechanics, Postural dysfunction, Decreased mobility, Decreased coordination, Decreased activity tolerance, Decreased endurance, Decreased strength, Decreased balance, Difficulty walking  Visit Diagnosis: Difficulty in walking, not elsewhere classified  Muscle weakness (generalized)  Repeated falls     Problem List Patient Active Problem List   Diagnosis Date Noted  . AAA (abdominal aortic aneurysm) without rupture ([place]) 12/10/2019  . Renal artery stenosis ([place]) 12/10/2019  . PAD (peripheral artery disease) ([place]) 12/10/2019  . Thromboembolism ([place])  08/30/2019  . Atherosclerosis of native arteries of extremity with intermittent claudication ([place]) 08/23/2019  . Cellulitis 08/22/2019  . Swelling of limb 08/17/2019  . Pain in limb 08/17/2019  . NICM (nonischemic cardiomyopathy) ([place]) 07/01/2019  . CHF (congestive heart failure) ([place]) 07/01/2019  . Malignant neoplasm of right lung ([place]) 04/18/2016  . CAD (coronary artery disease) 04/01/2016  . Adenocarcinoma ([place]) 04/01/2016  . Skin cyst 12/26/2015  . GERD (gastroesophageal reflux disease) 06/16/2015  . Abscess of back 06/06/2015  . Vertigo 03/27/2015  . Carpal tunnel syndrome 10/28/2014  . Status post cholecystectomy 09/29/2014  . Disease of digestive tract 09/29/2014  . Cardiomyopathy ([place]) 09/26/2014  . Type 2 diabetes mellitus without complications ([place])   . Sleep apnea   . Spinal stenosis, lumbar region, with neurogenic claudication 06/07/2014  . Lumbar stenosis with neurogenic claudication 06/07/2014  . Abnormal gait 08/20/2012  . H/O total knee replacement 08/20/2012  . Arthritis of knee, degenerative 08/20/2012  . Pacemaker-St.Jude 08/03/2012  . Cardiac conduction disorder 06/19/2012  . Acid reflux 06/18/2012  . 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 Triggering of digit 03/25/2012  . Hyperlipidemia 12/23/2011  . Essential hypertension 03/23/2011  . Atrioventricular block, complete ([place]) 03/23/2011  . Complete atrioventricular block ([place]) 03/23/2011    Durwin Reges DPT Chinita Greenland, SPT Durwin Reges 06/30/2020, 10:36 AM  [place] PHYSICAL AND SPORTS MEDICINE [address], [place], [address] Phone: [phone]   Fax:  [phone]  Name: BANNON GIAMMARCO MRN: [id number] Date of Birth: 1931/05/08

## 2020-07-04 ENCOUNTER — Ambulatory Visit: Payer: Medicare Other | Admitting: Physical Therapy

## 2020-07-04 ENCOUNTER — Ambulatory Visit: Payer: Self-pay

## 2020-07-04 ENCOUNTER — Ambulatory Visit
Admission: RE | Admit: 2020-07-04 | Discharge: 2020-07-04 | Disposition: A | Discharge status: Home / Self Care | Payer: Medicare Other | Source: Ambulatory Visit | Attending: Family Medicine | Admitting: Family Medicine

## 2020-07-04 ENCOUNTER — Other Ambulatory Visit: Payer: Self-pay

## 2020-07-04 ENCOUNTER — Ambulatory Visit
Admission: RE | Admit: 2020-07-04 | Discharge: 2020-07-04 | Disposition: A | Discharge status: Home / Self Care | Payer: Medicare Other | Attending: Family Medicine | Admitting: Family Medicine

## 2020-07-04 ENCOUNTER — Telehealth (INDEPENDENT_AMBULATORY_CARE_PROVIDER_SITE_OTHER): Payer: Medicare Other | Admitting: Family Medicine

## 2020-07-04 DIAGNOSIS — I509 Heart failure, unspecified: Secondary | ICD-10-CM | POA: Diagnosis not present

## 2020-07-04 DIAGNOSIS — I10 Essential (primary) hypertension: Secondary | ICD-10-CM | POA: Diagnosis not present

## 2020-07-04 DIAGNOSIS — I701 Atherosclerosis of renal artery: Secondary | ICD-10-CM | POA: Diagnosis not present

## 2020-07-04 DIAGNOSIS — R0602 Shortness of breath: Secondary | ICD-10-CM | POA: Diagnosis not present

## 2020-07-04 MED ORDER — AZITHROMYCIN 250 MG PO TABS: ORAL_TABLET | ORAL | 0 refills | Status: AC

## 2020-07-04 NOTE — Telephone Encounter (Signed)
Patient's son Howard [name] called and says the patient came over and says that he woke up around 0100 gasping for air. He sat in the chair and remained there until 0500 when it cleared up. The son says he walked from the car to inside the house, maybe 20 feet, and he's sitting, but is using his muscles a little more than normal to breathe. He says he's not in distress, not obviously struggling to breathe. He says he listened to his lungs and heard crackles in the RLL and didn't know if this is his normal sound since he has cancer. He says he's wondering if he needs some lasix to clear the fluid accumulating, since he did wake up gasping for air. He says he has swelling to his legs/feet, which is not more than normal. He denies any other symptoms. He asked if there are any appointments today. I advised no availability with PCP and he says any provider will be fine. I called the office and asked Howard [name], [facility] if there are any openings today. She says Howard [name] has one opening if the patient's son can access his MyChart account to do a video visit. I asked the son and he says he is able. I advised Howard [name] and she says she will have him to be scheduled for today at 1440 with Howard [name]. I advised the son of the appointment, care advice given, he verbalized understanding.   Reason for Disposition . [1] MILD difficulty breathing (e.g., minimal/no SOB at rest, SOB with walking, pulse <100) AND [2] NEW-onset or WORSE than normal  Answer Assessment - Initial Assessment Questions 1. RESPIRATORY STATUS: "Describe your breathing?" (e.g., wheezing, shortness of breath, unable to speak, severe coughing)      Shortness of breath 2. ONSET: "When did this breathing problem begin?"      1 am this morning 3. PATTERN "Does the difficult breathing come and go, or has it been constant since it started?"     Constant for 4 hours 4. SEVERITY: "How bad is your breathing?" (e.g., mild, moderate, severe)    - MILD: No SOB at rest, mild SOB  with walking, speaks normally in sentences, can lay down, no retractions, pulse < 100.    - MODERATE: SOB at rest, SOB with minimal exertion and prefers to sit, cannot lie down flat, speaks in phrases, mild retractions, audible wheezing, pulse 100-120.    - SEVERE: Very SOB at rest, speaks in single words, struggling to breathe, sitting hunched forward, retractions, pulse > 120      Mild-moderate 5. RECURRENT SYMPTOM: "Have you had difficulty breathing before?" If Yes, ask: "When was the last time?" and "What happened that time?"      No 6. CARDIAC HISTORY: "Do you have any history of heart disease?" (e.g., heart attack, angina, bypass surgery, angioplasty)      Yes 7. LUNG HISTORY: "Do you have any history of lung disease?"  (e.g., pulmonary embolus, asthma, emphysema)     Yes small lung cancer 8. CAUSE: "What do you think is causing the breathing problem?"      Buildup of fluid in lungs; crackles heard in the RLL 9. OTHER SYMPTOMS: "Do you have any other symptoms? (e.g., dizziness, runny nose, cough, chest pain, fever)     No 10. PREGNANCY: "Is there any chance you are pregnant?" "When was your last menstrual period?"       N/A 11. TRAVEL: "Have you traveled out of the country in the last month?" (e.g., travel  history, exposures)       No  Protocols used: BREATHING DIFFICULTY-A-AH

## 2020-07-04 NOTE — Progress Notes (Signed)
MyChart Video Visit    Virtual Visit via Video Note   This visit type was conducted due to national recommendations for restrictions regarding the COVID-19 Pandemic (e.g. social distancing) in an effort to limit this patient's exposure and mitigate transmission in our community. This patient is at least at moderate risk for complications without adequate follow up. This format is felt to be most appropriate for this patient at this time. Physical exam was limited by quality of the video and audio technology used for the visit.   Patient location: home Provider location: [facility]   Patient: Howard [name]   DOB: Jan 04, 1931   86 y.o. Male  MRN: [id number] Visit Date: 07/04/2020  Today's healthcare provider: Lelon Huh, MD   Chief Complaint  Patient presents with  . Shortness of Breath   Subjective    Shortness of Breath This is a new problem. The current episode started today (patient woke up at 1am this morning gasping for air). The problem has been gradually improving (although patient's breathing is still labored at rest). Associated symptoms include leg swelling and wheezing. Pertinent negatives include no abdominal pain, chest pain, fever or vomiting.  Patient's son Rozell Searing who is an Therapist, sports reported crackles in his lungs during ascultation. His RR is running about 32. No oximetry. Coughing up clear phlegm. Heart fluttering or chest pains. Has pacemaker.Has noticed a little bit of swelling, but no more than normal. Weight today is 193. His son reports patient's heart rate has not been accelerated.     Medications: Outpatient Medications Prior to Visit  Medication Sig  . apixaban (ELIQUIS) 5 MG TABS tablet Take 1 tablet (5 mg total) by mouth 2 (two) times daily.  Marland Kitchen aspirin EC 81 MG tablet Take 81 mg by mouth daily.  . Cholecalciferol (VITAMIN D3) 1000 UNITS CAPS Take 1,000 Units by mouth daily.   Marland Kitchen ENTRESTO 24-26 MG TAKE 1 TABLET TWICE A DAY  . ezetimibe (ZETIA) 10 MG tablet Take 1  tablet (10 mg total) by mouth daily.  . fluticasone (FLONASE) 50 MCG/ACT nasal spray Place 2 sprays into both nostrils daily.  . isosorbide mononitrate (IMDUR) 30 MG 24 hr tablet TAKE 1 TABLET DAILY  . metFORMIN (GLUCOPHAGE) 500 MG tablet Take 1 tablet (500 mg total) by mouth 2 (two) times daily with a meal.  . metoprolol succinate (TOPROL-XL) 25 MG 24 hr tablet Take 1 tablet (25 mg total) by mouth at bedtime.  . montelukast (SINGULAIR) 10 MG tablet Take 10 mg by mouth daily as needed (sneezing/allergies.).   Marland Kitchen Multiple Vitamin (MULTIVITAMIN WITH MINERALS) TABS tablet Take 1 tablet by mouth daily. One-A-Day Multivitamin  . omeprazole (PRILOSEC) 40 MG capsule TAKE 1 CAPSULE TWICE DAILY AS NEEDED  . simvastatin (ZOCOR) 40 MG tablet TAKE 1 TABLET AT BEDTIME  . spironolactone (ALDACTONE) 25 MG tablet Take 1 tablet (25 mg total) by mouth daily.   No facility-administered medications prior to visit.    Review of Systems  Constitutional: Negative for appetite change, chills and fever.  Respiratory: Positive for shortness of breath and wheezing. Negative for chest tightness.   Cardiovascular: Positive for leg swelling. Negative for chest pain and palpitations.  Gastrointestinal: Negative for abdominal pain, nausea and vomiting.  Musculoskeletal: Positive for joint swelling (in feet).      Objective    There were no vitals taken for this visit.   Physical Exam  Awake, alert, oriented x 3. In no apparent distress    Assessment & Plan  1. Shortness of breath  - DG Chest 2 View; Future Dyspnea improved now, but still having cough. Will cover with - azithromycin (ZITHROMAX) 250 MG tablet; 2 by mouth today, then 1 daily for 4 days  Dispense: 6 tablet; Refill: 0   2. Congestive heart failure, unspecified HF chronicity, unspecified heart failure type ([place])  His son states edema is no worse than usual and weight is near his baseline. However orthopnea may indicate CHF exacerbation.  Consider diuretic if any pulmonary congestion on Xr.   No follow-ups on file.     I discussed the assessment and treatment plan with the patient. The patient was provided an opportunity to ask questions and all were answered. The patient agreed with the plan and demonstrated an understanding of the instructions.   The patient was advised to call back or seek an in-person evaluation if the symptoms worsen or if the condition fails to improve as anticipated.  I provided 12 minutes of non-face-to-face time during this encounter.  The entirety of the information documented in the History of Present Illness, Review of Systems and Physical Exam were personally obtained by me. Portions of this information were initially documented by the CMA and reviewed by me for thoroughness and accuracy.     Lelon Huh, MD [facility] [phone] (phone) [phone] (fax)  [place]

## 2020-07-06 ENCOUNTER — Ambulatory Visit: Payer: Medicare Other | Admitting: Physical Therapy

## 2020-07-07 ENCOUNTER — Ambulatory Visit (INDEPENDENT_AMBULATORY_CARE_PROVIDER_SITE_OTHER): Payer: Medicare Other | Admitting: *Deleted

## 2020-07-07 DIAGNOSIS — I509 Heart failure, unspecified: Secondary | ICD-10-CM

## 2020-07-07 DIAGNOSIS — I428 Other cardiomyopathies: Secondary | ICD-10-CM

## 2020-07-07 LAB — CUP PACEART REMOTE DEVICE CHECK
Battery Remaining Longevity: 107 mo
Battery Remaining Percentage: 95.5 %
Battery Voltage: 3.01 V
Brady Statistic AP VP Percent: 1.4 %
Brady Statistic AP VS Percent: 1 %
Brady Statistic AS VP Percent: 98 %
Brady Statistic AS VS Percent: 1 %
Brady Statistic RA Percent Paced: 1.2 %
Brady Statistic RV Percent Paced: 99 %
Date Time Interrogation Session: 20210716020014
Implantable Lead Implant Date: 20111208
Implantable Lead Implant Date: 20111208
Implantable Lead Location: 753859
Implantable Lead Location: 753860
Implantable Lead Model: 1948
Implantable Pulse Generator Implant Date: 20200717
Lead Channel Impedance Value: 350 Ohm
Lead Channel Impedance Value: 440 Ohm
Lead Channel Pacing Threshold Amplitude: 0.5 V
Lead Channel Pacing Threshold Amplitude: 1.25 V
Lead Channel Pacing Threshold Pulse Width: 0.5 ms
Lead Channel Pacing Threshold Pulse Width: 0.5 ms
Lead Channel Sensing Intrinsic Amplitude: 12 mV
Lead Channel Sensing Intrinsic Amplitude: 3.8 mV
Lead Channel Setting Pacing Amplitude: 1.5 V
Lead Channel Setting Pacing Amplitude: 2 V
Lead Channel Setting Pacing Pulse Width: 0.5 ms
Lead Channel Setting Sensing Sensitivity: 4 mV
Pulse Gen Model: 2272
Pulse Gen Serial Number: 9150507

## 2020-07-10 NOTE — Progress Notes (Signed)
Remote pacemaker transmission.   

## 2020-07-11 ENCOUNTER — Encounter: Payer: Medicare Other | Admitting: Physical Therapy

## 2020-07-13 ENCOUNTER — Encounter: Payer: Medicare Other | Admitting: Physical Therapy

## 2020-07-18 ENCOUNTER — Encounter: Payer: Medicare Other | Admitting: Physical Therapy

## 2020-07-20 ENCOUNTER — Encounter: Payer: Medicare Other | Admitting: Physical Therapy

## 2020-07-25 DIAGNOSIS — H6123 Impacted cerumen, bilateral: Secondary | ICD-10-CM | POA: Diagnosis not present

## 2020-07-25 DIAGNOSIS — H903 Sensorineural hearing loss, bilateral: Secondary | ICD-10-CM | POA: Diagnosis not present

## 2020-08-25 ENCOUNTER — Other Ambulatory Visit: Payer: Self-pay | Admitting: Internal Medicine

## 2020-08-25 ENCOUNTER — Other Ambulatory Visit (INDEPENDENT_AMBULATORY_CARE_PROVIDER_SITE_OTHER): Payer: Self-pay | Admitting: Vascular Surgery

## 2020-08-25 DIAGNOSIS — I70212 Atherosclerosis of native arteries of extremities with intermittent claudication, left leg: Secondary | ICD-10-CM

## 2020-08-25 NOTE — Telephone Encounter (Signed)
This is a [facility] pt 

## 2020-08-25 NOTE — Telephone Encounter (Signed)
Please schedule F/U appointment with Dr. Caryl Comes. Thank you!

## 2020-08-29 NOTE — Telephone Encounter (Signed)
Patient was scheduled on 11/23 with Caryl Comes (his first opening)

## 2020-09-06 DIAGNOSIS — C44222 Squamous cell carcinoma of skin of right ear and external auricular canal: Secondary | ICD-10-CM | POA: Diagnosis not present

## 2020-09-06 DIAGNOSIS — L570 Actinic keratosis: Secondary | ICD-10-CM | POA: Diagnosis not present

## 2020-09-06 DIAGNOSIS — C44329 Squamous cell carcinoma of skin of other parts of face: Secondary | ICD-10-CM | POA: Diagnosis not present

## 2020-09-06 DIAGNOSIS — L538 Other specified erythematous conditions: Secondary | ICD-10-CM | POA: Diagnosis not present

## 2020-09-06 DIAGNOSIS — D485 Neoplasm of uncertain behavior of skin: Secondary | ICD-10-CM | POA: Diagnosis not present

## 2020-09-06 DIAGNOSIS — L720 Epidermal cyst: Secondary | ICD-10-CM | POA: Diagnosis not present

## 2020-09-06 DIAGNOSIS — R208 Other disturbances of skin sensation: Secondary | ICD-10-CM | POA: Diagnosis not present

## 2020-09-06 DIAGNOSIS — C44629 Squamous cell carcinoma of skin of left upper limb, including shoulder: Secondary | ICD-10-CM | POA: Diagnosis not present

## 2020-09-06 DIAGNOSIS — D225 Melanocytic nevi of trunk: Secondary | ICD-10-CM | POA: Diagnosis not present

## 2020-09-06 DIAGNOSIS — Z08 Encounter for follow-up examination after completed treatment for malignant neoplasm: Secondary | ICD-10-CM | POA: Diagnosis not present

## 2020-09-06 DIAGNOSIS — L728 Other follicular cysts of the skin and subcutaneous tissue: Secondary | ICD-10-CM | POA: Diagnosis not present

## 2020-09-06 DIAGNOSIS — Z85828 Personal history of other malignant neoplasm of skin: Secondary | ICD-10-CM | POA: Diagnosis not present

## 2020-09-13 ENCOUNTER — Other Ambulatory Visit: Payer: Self-pay | Admitting: Family Medicine

## 2020-09-13 DIAGNOSIS — E119 Type 2 diabetes mellitus without complications: Secondary | ICD-10-CM

## 2020-09-18 DIAGNOSIS — R05 Cough: Secondary | ICD-10-CM | POA: Diagnosis not present

## 2020-09-18 DIAGNOSIS — Z03818 Encounter for observation for suspected exposure to other biological agents ruled out: Secondary | ICD-10-CM | POA: Diagnosis not present

## 2020-09-18 DIAGNOSIS — Z20822 Contact with and (suspected) exposure to covid-19: Secondary | ICD-10-CM | POA: Diagnosis not present

## 2020-09-21 DIAGNOSIS — J0121 Acute recurrent ethmoidal sinusitis: Secondary | ICD-10-CM | POA: Diagnosis not present

## 2020-09-21 DIAGNOSIS — R05 Cough: Secondary | ICD-10-CM | POA: Diagnosis not present

## 2020-09-27 ENCOUNTER — Other Ambulatory Visit: Payer: Self-pay

## 2020-09-27 ENCOUNTER — Ambulatory Visit
Admission: RE | Admit: 2020-09-27 | Discharge: 2020-09-27 | Disposition: A | Discharge status: Home / Self Care | Payer: Medicare Other | Source: Ambulatory Visit | Attending: Radiation Oncology | Admitting: Radiation Oncology

## 2020-09-27 DIAGNOSIS — J439 Emphysema, unspecified: Secondary | ICD-10-CM | POA: Diagnosis not present

## 2020-09-27 DIAGNOSIS — C3411 Malignant neoplasm of upper lobe, right bronchus or lung: Secondary | ICD-10-CM | POA: Diagnosis not present

## 2020-09-27 DIAGNOSIS — I700 Atherosclerosis of aorta: Secondary | ICD-10-CM | POA: Diagnosis not present

## 2020-09-27 DIAGNOSIS — I2510 Atherosclerotic heart disease of native coronary artery without angina pectoris: Secondary | ICD-10-CM | POA: Diagnosis not present

## 2020-09-27 DIAGNOSIS — C3490 Malignant neoplasm of unspecified part of unspecified bronchus or lung: Secondary | ICD-10-CM | POA: Diagnosis not present

## 2020-09-27 LAB — POCT I-STAT CREATININE: Creatinine, Ser: 1.2 mg/dL (ref 0.61–1.24)

## 2020-09-27 MED ORDER — IOHEXOL 300 MG/ML  SOLN
75.0000 mL | Freq: Once | INTRAMUSCULAR | Status: AC | PRN
  Administered 2020-09-27: 75 mL via INTRAVENOUS

## 2020-10-04 ENCOUNTER — Ambulatory Visit
Admission: RE | Admit: 2020-10-04 | Discharge: 2020-10-04 | Disposition: A | Discharge status: Home / Self Care | Payer: Medicare Other | Source: Ambulatory Visit | Attending: Radiation Oncology | Admitting: Radiation Oncology

## 2020-10-04 ENCOUNTER — Other Ambulatory Visit: Payer: Self-pay

## 2020-10-04 VITALS — BP 164/80 | HR 81 | Temp 96.9°F | Wt 190.0 lb

## 2020-10-04 DIAGNOSIS — C3411 Malignant neoplasm of upper lobe, right bronchus or lung: Secondary | ICD-10-CM | POA: Insufficient documentation

## 2020-10-04 DIAGNOSIS — Z923 Personal history of irradiation: Secondary | ICD-10-CM | POA: Insufficient documentation

## 2020-10-04 DIAGNOSIS — C3491 Malignant neoplasm of unspecified part of right bronchus or lung: Secondary | ICD-10-CM

## 2020-10-04 NOTE — Progress Notes (Signed)
Radiation Oncology Follow up Note  Name: Howard [name]   Date:   10/04/2020 MRN:  [id number] DOB: Jan 24, 1931    This 86 y.o. male presents to the clinic today for follow-up of repeat CT scan and patient treated with SBRT to his right upper lobe 5 years prior tracking a new lesion in his right lower lobe for potential malignancy.  REFERRING PROVIDER: Margo Common, PA  HPI: Patient is an 86 year old male now out 5 years from SBRT to his right l upper lobe for stage I adenocarcinoma.  We have been tracking a right lower lobe lesion with serial CT scans.  His most recent scan this month showed no significant change in appearance of the solid right lower lobe pulmonary lesion.  No new or progressive disease was identified elsewhere.  He continues to be asymptomatic specifically [place] cough hemoptysis or chest tightness.  P.o. intake is good energy level is good.  COMPLICATIONS OF TREATMENT: none  FOLLOW UP COMPLIANCE: keeps appointments   PHYSICAL EXAM:  BP (!) 164/80    Pulse 81    Temp (!) 96.9 F (36.1 C) (Tympanic)    Wt 190 lb (86.2 kg)    BMI 28.06 kg/m  Well-developed well-nourished patient in NAD. HEENT reveals PERLA, EOMI, discs not visualized.  Oral cavity is clear. No oral mucosal lesions are identified. Neck is clear without evidence of cervical or supraclavicular adenopathy. Lungs are clear to A&P. Cardiac examination is essentially unremarkable with regular rate and rhythm without murmur rub or thrill. Abdomen is benign with no organomegaly or masses noted. Motor sensory and DTR levels are equal and symmetric in the upper and lower extremities. Cranial nerves II through XII are grossly intact. Proprioception is intact. No peripheral adenopathy or edema is identified. No motor or sensory levels are noted. Crude visual fields are within normal range.  RADIOLOGY RESULTS: Serial CT scans are reviewed compatible with above-stated findings  PLAN: Present time I would continue to  observe I asked to see him back in 6 months with a repeat CT scan should this nodule be unchanged at that time would go to once year follow-up visits.  Patient is pleased with her overall recommendations.  Patient knows to call with any concerns.  I would like to take this opportunity to thank you for allowing me to participate in the care of your patient.Noreene Filbert, MD

## 2020-10-06 ENCOUNTER — Ambulatory Visit (INDEPENDENT_AMBULATORY_CARE_PROVIDER_SITE_OTHER): Payer: Medicare Other

## 2020-10-06 DIAGNOSIS — I442 Atrioventricular block, complete: Secondary | ICD-10-CM | POA: Diagnosis not present

## 2020-10-07 LAB — CUP PACEART REMOTE DEVICE CHECK
Battery Remaining Longevity: 114 mo
Battery Remaining Percentage: 95.5 %
Battery Voltage: 3.01 V
Brady Statistic AP VP Percent: 1.7 %
Brady Statistic AP VS Percent: 1 %
Brady Statistic AS VP Percent: 98 %
Brady Statistic AS VS Percent: 1 %
Brady Statistic RA Percent Paced: 1.4 %
Brady Statistic RV Percent Paced: 99 %
Date Time Interrogation Session: 20211015020016
Implantable Lead Implant Date: 20111208
Implantable Lead Implant Date: 20111208
Implantable Lead Location: 753859
Implantable Lead Location: 753860
Implantable Lead Model: 1948
Implantable Pulse Generator Implant Date: 20200717
Lead Channel Impedance Value: 390 Ohm
Lead Channel Impedance Value: 440 Ohm
Lead Channel Pacing Threshold Amplitude: 0.5 V
Lead Channel Pacing Threshold Amplitude: 1.25 V
Lead Channel Pacing Threshold Pulse Width: 0.5 ms
Lead Channel Pacing Threshold Pulse Width: 0.5 ms
Lead Channel Sensing Intrinsic Amplitude: 12 mV
Lead Channel Sensing Intrinsic Amplitude: 3.6 mV
Lead Channel Setting Pacing Amplitude: 1.5 V
Lead Channel Setting Pacing Amplitude: 2 V
Lead Channel Setting Pacing Pulse Width: 0.5 ms
Lead Channel Setting Sensing Sensitivity: 4 mV
Pulse Gen Model: 2272
Pulse Gen Serial Number: 9150507

## 2020-10-09 DIAGNOSIS — C44629 Squamous cell carcinoma of skin of left upper limb, including shoulder: Secondary | ICD-10-CM | POA: Diagnosis not present

## 2020-10-11 NOTE — Progress Notes (Signed)
Remote pacemaker transmission.   

## 2020-10-13 DIAGNOSIS — R051 Acute cough: Secondary | ICD-10-CM | POA: Diagnosis not present

## 2020-10-13 DIAGNOSIS — J301 Allergic rhinitis due to pollen: Secondary | ICD-10-CM | POA: Diagnosis not present

## 2020-10-15 ENCOUNTER — Other Ambulatory Visit: Payer: Self-pay | Admitting: Internal Medicine

## 2020-10-24 ENCOUNTER — Telehealth: Payer: Self-pay

## 2020-10-24 NOTE — Telephone Encounter (Signed)
Faxed the signed referral to the number provided and patient's spouse is notified.

## 2020-10-24 NOTE — Telephone Encounter (Signed)
Received faxed from Rehab regarding patient's dizziness --called the patient and as per spouse --he is followed by cardiologist in past and has Hx of Vertigo was advised in past that he can be referred to therapist --as per wife there are two in the hospital, let me know if patient needed to schedule an appointment or just can be referred.

## 2020-10-25 ENCOUNTER — Other Ambulatory Visit: Payer: Self-pay

## 2020-10-25 ENCOUNTER — Ambulatory Visit: Payer: Medicare Other | Attending: Family Medicine

## 2020-10-25 DIAGNOSIS — Z9181 History of falling: Secondary | ICD-10-CM

## 2020-10-25 DIAGNOSIS — R42 Dizziness and giddiness: Secondary | ICD-10-CM | POA: Diagnosis not present

## 2020-10-25 NOTE — Therapy (Signed)
[place] MAIN [facility] SERVICES [address] [place], [place], [address] Phone: [phone]   Fax:  [phone]  Physical Therapy Evaluation  Patient Details  Name: Howard [name] MRN: [id number] Date of Birth: [birth year] Referring Provider (PT): [name] [place]   Encounter Date: 10/25/2020   PT End of Session - 10/26/20 1150    Visit Number 1    Number of Visits 9    Date for PT Re-Evaluation 12/20/20    Authorization Type eval: 10/25/20    PT Start Time 1605    PT Stop Time 1705    PT Time Calculation (min) 60 min    Activity Tolerance Patient tolerated treatment well    Behavior During Therapy [facility] for tasks assessed/performed           Past Medical History:  Diagnosis Date   Arthritis    Bell palsy    Cancer ([place])    prostate and skin   Chronic combined systolic and diastolic CHF, NYHA class 1 ([place])    a. 07/2014 Echo: EF 35-40%, Gr 1 DD.   Complete heart block ([place])    a. 11/2010 s/p SJM 2210 Accent DC PPM, ser# [id number].   Depression    Diabetes mellitus without complication ([place])    Fall 11-10-14   GERD (gastroesophageal reflux disease)    History of prostate cancer    Hyperlipidemia    Hypertension    LBBB (left bundle branch block)    Left-sided Bell's palsy    Lung cancer ([place]) 2016   NICM (nonischemic cardiomyopathy) ([place])    a. 07/2014 Echo: EF 35-40%, mid-apicalanteroseptal DK, Gr 1 DD, mild-mod dil LA.   Non-obstructive CAD    a. 07/2014 Abnl MV;  b. 08/2014 Cath: LM nl, LAD 30p, RI 40p, LCX nl, OM1 40, RCA dominant 30p, 70d-->Med Rx.   Poor balance    Presence of permanent cardiac pacemaker    Sleep apnea    a. cpap   Vertigo    WPW (Wolff-Parkinson-White syndrome)    a. S/P RFCA 1991.    Past Surgical History:  Procedure Laterality Date   ABDOMINAL AORTIC ENDOVASCULAR STENT GRAFT  08/25/2019   Procedure: ABDOMINAL AORTIC ENDOVASCULAR STENT GRAFT;  Surgeon: Algernon Huxley, MD;  Location:  [facility] ORS;  Service: Vascular;;   ANGIOPLASTY Left 08/25/2019   Procedure: ANGIOPLASTY;  Surgeon: Algernon Huxley, MD;  Location: [facility] ORS;  Service: Vascular;  Laterality: Left;  left SFA and stent placement   APPLICATION OF WOUND VAC Left 06/07/2015   Procedure: APPLICATION OF WOUND VAC;  Surgeon: Robert Bellow, MD;  Location: [facility] ORS;  Service: General;  Laterality: Left;  left upper back   [place]  08/26/2014   Single vessel obstructive CAD   CARPAL TUNNEL RELEASE  04-04-15   [facility]   CATARACT EXTRACTION  07-31-11 and 09-18-11   Catheter ablation  1991   for WPW   cervical fusion     CHOLECYSTECTOMY  09-07-14   ENDARTERECTOMY FEMORAL Left 08/25/2019   Procedure: ENDARTERECTOMY FEMORAL;  Surgeon: Algernon Huxley, MD;  Location: [facility] ORS;  Service: Vascular;  Laterality: Left;  common and produndis    ENDOVASCULAR REPAIR/STENT GRAFT Right 08/25/2019   Procedure: ENDOVASCULAR REPAIR/STENT GRAFT;  Surgeon: Algernon Huxley, MD;  Location: [facility] ORS;  Service: Vascular;  Laterality: Right;  renal artery   HAND SURGERY     right 1993; left 2005  HERNIA REPAIR  1955   INSERT / REPLACE / REMOVE PACEMAKER     INSERTION OF ILIAC STENT Bilateral 08/25/2019   Procedure: INSERTION OF ILIAC STENT;  Surgeon: Algernon Huxley, MD;  Location: [facility] ORS;  Service: Vascular;  Laterality: Bilateral;   JOINT REPLACEMENT Left 2013   knee   JOINT REPLACEMENT Right 2004   knee   KNEE SURGERY     left knee 1991 and 1992; right knee 1995   LEFT HEART CATHETERIZATION WITH CORONARY ANGIOGRAM N/A 08/26/2014   Procedure: LEFT HEART CATHETERIZATION WITH CORONARY ANGIOGRAM;  Surgeon: [name], MD;  Location: [facility] CATH LAB;  Service: Cardiovascular;  Laterality: N/A;   LOWER EXTREMITY ANGIOGRAPHY Left 08/23/2019   Procedure: Lower Extremity Angiography;  Surgeon: Algernon Huxley, MD;  Location: [place] CV LAB;  Service: Cardiovascular;  Laterality: Left;   LUMBAR  LAMINECTOMY/DECOMPRESSION MICRODISCECTOMY N/A 06/07/2014   Procedure: LUMBAR FOUR TO FIVE LUMBAR LAMINECTOMY/DECOMPRESSION MICRODISCECTOMY 1 LEVEL;  Surgeon: [name], MD;  Location: [place] NEURO ORS;  Service: Neurosurgery;  Laterality: N/A;   LUNG BIOPSY Right 2016   Dr [name]   PACEMAKER INSERTION     PPM-- [employer] Jude 11/30/10 by Storm Frisk GENERATOR CHANGEOUT N/A 07/09/2019   Procedure: PPM GENERATOR CHANGEOUT;  Surgeon: Deboraha Sprang, MD;  Location: [place] CV LAB;  Service: Cardiovascular;  Laterality: N/A;   PROSTATE SURGERY     cancer--1998, prostatectomy   REPLACEMENT TOTAL KNEE     2004   ruptured disc     1962 and 1998   TEE WITHOUT CARDIOVERSION N/A 09/01/2019   Procedure: TRANSESOPHAGEAL ECHOCARDIOGRAM (TEE);  Surgeon: [name], MD;  Location: [facility] ORS;  Service: Cardiovascular;  Laterality: N/A;   TEMPORARY PACEMAKER N/A 07/09/2019   Procedure: TEMPORARY PACEMAKER;  Surgeon: Deboraha Sprang, MD;  Location: [place] CV LAB;  Service: Cardiovascular;  Laterality: N/A;   TRIGGER FINGER RELEASE  01-24-15   WOUND DEBRIDEMENT Left 06/07/2015   Procedure: DEBRIDEMENT WOUND;  Surgeon: Robert Bellow, MD;  Location: [facility] ORS;  Service: General;  Laterality: Left;  left upper back    There were no vitals filed for this visit.    Subjective Assessment - 10/25/20 1609    Subjective Dizziness    Pertinent History Pt struggles with imbalance but now he is also complaining of vertigo when he tips his head back or leans forward. This started 3-4 months ago. He has been treated at this clinic in the past for bilateral posterior canal BPPV but more significant on the L side.  He reports that he lost consciousness a few months ago, fell, and landed with his head against the cabinet. Denies any injuries from this fall. Pt did have vascular surgery in Sept 2020. Patient had difficulty ambulating prior to this for a "few months", and eventually was hospitalized for cellulitis of  his L toe d/t decreased blood flow. Following vascular surgery in Sept 2020, patient had HHPT for 6 weeks followed by OP PT. Patient has been using a SPC for ambulation intermittently for multiple years. He has a history of DMII with peripheral neuropathy in L foot.    Limitations --    How long can you sit comfortably? --    How long can you stand comfortably? --    How long can you walk comfortably? --    Patient Stated Goals Improve vertigo    Currently in Pain? No/denies    Pain Onset --  [facility] PT Assessment - 10/26/20 1147      Assessment   Medical Diagnosis Dizziness/possible fall threat    Referring Provider (PT) [name] PA    Onset Date/Surgical Date 06/25/20   Approximate   Hand Dominance Right    Next MD Visit Not reported    Prior Therapy Yes, OP PT and HH PT      Precautions   Precautions Fall      Restrictions   Weight Bearing Restrictions No      Balance Screen   Has the patient fallen in the past 6 months Yes    How many times? 1    Has the patient had a decrease in activity level because of a fear of falling?  No    Is the patient reluctant to leave their home because of a fear of falling?  No      Home Environment   Living Environment Private residence    Living Arrangements Spouse/significant other    Available Help at Discharge Family    Type of [place] to enter    Entrance Stairs-Number of Steps 3 with bilateral rails    Entrance Stairs-Rails Can reach both    Home Layout Two level;Laundry or work area in basement    Alternate Therapist, sports of Steps 1 flight    Alternate Level Stairs-Rails Can reach both    [employer] - single point      Prior Function   Level of Independence Independent      Cognition   Overall Cognitive Status Within Functional Limits for tasks assessed              VESTIBULAR AND BALANCE EVALUATION   HISTORY:  Subjective history of current problem: Pt  struggles with imbalance but now he is also complaining of vertigo when he tips his head back or leans forward. This started 3-4 months ago. He has been treated at this clinic in the past for bilateral posterior canal BPPV but more significant on the L side.  He reports that he lost consciousness a few months ago, fell, and landed with his head against the cabinet. Denies any injuries from this fall. Pt did have vascular surgery in Sept 2020. Patient had difficulty ambulating prior to this for a "few months", and eventually was hospitalized for cellulitis of his L toe d/t decreased blood flow. Following vascular surgery in Sept 2020, patient had HHPT for 6 weeks followed by OP PT. Patient has been using a SPC for ambulation intermittently for multiple years. He has a history of DMII with peripheral neuropathy in L foot.  Description of dizziness: (vertigo, unsteadiness, lightheadedness, falling, general unsteadiness, whoozy, swimmy-headed sensation, aural fullness) vertigo, "black out" Frequency: daily Duration: 1-2s Symptom nature: (motion provoked, positional, spontaneous, constant, variable, intermittent) positional  Provocative Factors: leaning his head back or bending forward Easing Factors: waiting for symptoms to pass, avoiding aggravating positions  History of similar episodes: yes, previously treated at this clinic for L posterior canal BPPV  Falls (yes/no): yes Number of falls in past 6 months: 1  Prior Functional Level: Independent with transfers, ambulation, ADLs/IADLs with prn use of single point cane.   Auditory complaints (tinnitus, pain, drainage, hearing loss, aural fullness): Pt wears bilateral hearing aids otherwise no changes Vision (diplopia, visual field loss, recent changes, last eye exam): None  Red Flags: (dysarthria, dysphagia, drop attacks, bowel and bladder changes, recent weight loss/gain) Review of systems negative for  red flags.     EXAMINATION  POSTURE: Mild  forward head/rounded shoulders  NEUROLOGICAL SCREEN: (2+ unless otherwise noted.) N=normal  Ab=abnormal  Level Dermatome R L Myotome R L Reflex R L  C3 Anterior Neck N N Sidebend C2-3 N N Jaw CN V    C4 Top of Shoulder N N Shoulder Shrug C4 N N Hoffmans UMN    C5 Lateral Upper Arm N N Shoulder ABD C4-5 N N Biceps C5-6    C6 Lateral Arm/ Thumb N N Arm Flex/ Wrist Ext C5-6 N N Brachiorad. C5-6    C7 Middle Finger N N Arm Ext//Wrist Flex C6-7 N N Triceps C7    C8 4th & 5th Finger N N Flex/ Ext Carpi Ulnaris C8 N N Patellar (L3-4)    T1 Medial Arm N N Interossei T1 N N Gastrocnemius    L2 Medial thigh/groin N N Illiopsoas (L2-3) N N     L3 Lower thigh/med.knee N N Quadriceps (L3-4) N N     L4 Medial leg/lat thigh N N Tibialis Ant (L4-5) N N     L5 Lat. leg & dorsal foot N N EHL (L5) N N     S1 post/lat foot/thigh/leg N N Gastrocnemius (S1-2) N N     S2 Post./med. thigh & leg N N Hamstrings (L4-S3) N N       Cranial Nerves Visual acuity and visual fields are intact  Extraocular muscles are intact  Facial sensation is intact bilaterally  Facial strength is intact bilaterally  Hearing is normal as tested by gross conversation Palate elevates midline, normal phonation  Shoulder shrug strength is intact  Tongue protrudes midline     COORDINATION: Finger to Nose: Normal Pronator Drift: Negative Rapid Alternating Movements: Normal Finger to Thumb Opposition: Normal  MUSCULOSKELETAL SCREEN: Cervical Spine ROM:  Patient with decreased AROM cervical spine all planes. Patient with grossly 45 degrees AROM right and left cervical rotation and extension.    ROM: UE/LE grossly WFL  MMT: Gross screen WFL. No focal deficits identified  Functional Mobility: Modified independent for transfers and ambulation with single point cane   Gait: Patient arrives to clinic ambulating with SPC. Patient instructed as to proper sequencing with SPC. Patient ambulating with decreased cadence and decreased  step length. Scanning of visual environment with gait is poor   Clinical Test of Sensory Interaction for Balance    (CTSIB): Deferred   OCULOMOTOR / VESTIBULAR TESTING: Deferred on this date   BPPV TESTS:  Symptoms Duration Intensity Nystagmus  L Dix-Hallpike Vertigo 20-30s 3/10 None  R Dix-Hallpike None   None  L Head Roll None   None  R Head Roll None   None  L Sidelying Test      R Sidelying Test        FUNCTIONAL OUTCOME MEASURES   Results Comments  ABC Scale 55.625% Below cut-off. Low balance confidence  DHI 28/100 Mild perception for disability  FOTO 55 Predicted improvement to 58        Objective measurements completed on examination: See above findings.        TREATMENT   Canalith Repositioning Treatment Pt treated with 2 bouts of Epley Maneuver for presumed L posterior canal BPPV. 2 minute holds in each position and retesting between maneuvers. After first maneuver pt reports very mild dizziness but improved from previous test. No nystagmus observed. Continued with second maneuver.           PT Education - 10/26/20 1150  Education Details Plan of care    Person(s) Educated Patient    Methods Explanation    Comprehension Verbalized understanding            PT Short Term Goals - 10/26/20 1153      PT SHORT TERM GOAL #1   Title Pt will be independent with HEP in order to improve vertogp. strength, and balance in order to decrease fall risk and improve symptom-free function at home.    Time 4    Period Weeks    Status New    Target Date 11/23/20             PT Long Term Goals - 10/26/20 1154      PT LONG TERM GOAL #1   Title Pt will report no further bouts of dizziness/vertigo when tipping his head back or leaning forward in order to improve symptom-free function at home and decrease risk for falls    Time 8    Period Weeks    Status New    Target Date 12/20/20      PT LONG TERM GOAL #2   Title Pt will decrease DHI score by at  least 18 points in order to demonstrate clinically significant reduction in disability    Baseline 10/25/20: 28/100    Time 8    Period Weeks    Status New    Target Date 12/21/20      PT LONG TERM GOAL #3   Title Pt will improve ABC by at least 13% in order to demonstrate clinically significant improvement in balance confidence.    Baseline 10/25/20: 55.625%    Time 8    Period Weeks    Status New    Target Date 12/20/20      PT LONG TERM GOAL #4   Title Pt will improve FOTO score to at least 58 in order to demonstrate significant improvement in function    Baseline 10/25/20: 55    Time 8    Period Weeks    Status New    Target Date 12/21/20                  Plan - 10/26/20 1151    Clinical Impression Statement Pt is a pleasant 86 year-old male referred for dizziness/fall risk. He has a previous history of bilateral posterior canal BPPV more significantly on the left side and was treated successfully in the past at this clinic.  Patient is complaining of similar symptoms stating that he is getting vertigo when tipping his head back for leaning forward.  Performed Dix-Hallpike testing today which is negative on the right and positive for symptom reproduction on the left however no nystagmus is observed.  Patient was taken through 2 bouts of the Epley maneuver based on symptomatic response with improvement during retesting between maneuvers.  We will continue to test patient and treat as needed and once clear will perform additional balance measures.  Patient reports mild level of self-reported disability on DHI score 28/100.  Low balance confidence with 55.65% on ABC. Pt will benefit from skilled PT services to address deficits in vertigo, balance, and decrease risk for future falls.    Personal Factors and Comorbidities Age;Comorbidity 3+;Past/Current Experience;Time since onset of injury/illness/exacerbation    Comorbidities DM2 with periphreal nueropathy, GERD, HTN, HLD, past lung  cancer, arthritis, vertigo, PAD    Examination-Activity Limitations Locomotion Level    Examination-Participation Restrictions Yard Work;Community Activity;Church    Stability/Clinical Decision Making Unstable/Unpredictable  Clinical Decision Making Moderate    Rehab Potential Good    PT Frequency 1x / week    PT Duration 8 weeks    PT Treatment/Interventions ADLs/Self Care Home Management;Electrical Stimulation;Therapeutic activities;Patient/family education;Therapeutic exercise;DME Instruction;Gait training;Stair training;Moist Heat;Cryotherapy;Ultrasound;Aquatic Therapy;Functional mobility training;Manual techniques;Dry needling;Passive range of motion;Neuromuscular re-education;Balance training;Vestibular;Joint Manipulations;Spinal Manipulations;Energy conservation;Biofeedback;Canalith Repostioning;Iontophoresis 4mg /ml Dexamethasone;Traction    PT Next Visit Plan Recheck Dix-Hallpike and treat as necessary, once vertigo resolved screen balance and treat as necessary    PT Home Exercise Plan None currently    Consulted and Agree with Plan of Care Patient           Patient will benefit from skilled therapeutic intervention in order to improve the following deficits and impairments:  Dizziness, Difficulty walking, Decreased balance  Visit Diagnosis: Dizziness and giddiness  History of falling     Problem List Patient Active Problem List   Diagnosis Date Noted   AAA (abdominal aortic aneurysm) without rupture ([place]) 12/10/2019   Renal artery stenosis ([place]) 12/10/2019   PAD (peripheral artery disease) ([place]) 12/10/2019   Thromboembolism ([place]) 08/30/2019   Atherosclerosis of native arteries of extremity with intermittent claudication ([place]) 08/23/2019   Swelling of limb 08/17/2019   Pain in limb 08/17/2019   NICM (nonischemic cardiomyopathy) ([place]) 07/01/2019   CHF (congestive heart failure) ([place]) 07/01/2019   Malignant neoplasm of right lung ([place]) 04/18/2016   CAD  (coronary artery disease) 04/01/2016   Adenocarcinoma ([place]) 04/01/2016   Skin cyst 12/26/2015   GERD (gastroesophageal reflux disease) 06/16/2015   Abscess of back 06/06/2015   Vertigo 03/27/2015   Carpal tunnel syndrome 10/28/2014   Status post cholecystectomy 09/29/2014   Disease of digestive tract 09/29/2014   Cardiomyopathy ([place]) 09/26/2014   Type 2 diabetes mellitus without complications ([place])    Sleep apnea    Spinal stenosis, lumbar region, with neurogenic claudication 06/07/2014   Lumbar stenosis with neurogenic claudication 06/07/2014   Abnormal gait 08/20/2012   H/O total knee replacement 08/20/2012   Arthritis of knee, degenerative 08/20/2012   Pacemaker-St.Jude 08/03/2012   Cardiac conduction disorder 06/19/2012   Acid reflux 06/18/2012   Nodal rhythm disorder 06/18/2012   Triggering of digit 03/25/2012   Hyperlipidemia 12/23/2011   Essential hypertension 03/23/2011   Atrioventricular block, complete ([place]) 03/23/2011   Complete atrioventricular block ([place]) 03/23/2011   Lyndel Safe Kore Madlock PT, DPT, GCS  Idabell Picking 10/26/2020, 2:10 PM  [place] MAIN [facility] SERVICES [address] [place], [place], [address] Phone: [phone]   Fax:  [phone]  Name: Howard [name] MRN: [id number] Date of Birth: February 24, 1931

## 2020-10-30 DIAGNOSIS — R051 Acute cough: Secondary | ICD-10-CM | POA: Diagnosis not present

## 2020-10-30 DIAGNOSIS — J329 Chronic sinusitis, unspecified: Secondary | ICD-10-CM | POA: Diagnosis not present

## 2020-10-31 ENCOUNTER — Other Ambulatory Visit: Payer: Self-pay

## 2020-10-31 ENCOUNTER — Ambulatory Visit: Payer: Medicare Other

## 2020-10-31 DIAGNOSIS — Z9181 History of falling: Secondary | ICD-10-CM | POA: Diagnosis not present

## 2020-10-31 DIAGNOSIS — R42 Dizziness and giddiness: Secondary | ICD-10-CM | POA: Diagnosis not present

## 2020-10-31 NOTE — Therapy (Signed)
[place] MAIN [facility] [address] [place], [place], [address] Phone: [phone]   Fax:  [phone]  Physical Therapy Treatment  Patient Details  Name: Howard [name] MRN: [id number] Date of Birth: 05/09/1931 Referring Provider (PT): [name] [place]   Encounter Date: 10/31/2020   PT End of Session - 10/31/20 1408    Visit Number 2    Number of Visits 9    Date for PT Re-Evaluation 12/20/20    Authorization Type eval: 10/25/20    PT Start Time 1415    PT Stop Time 1500    PT Time Calculation (min) 45 min    Activity Tolerance Patient tolerated treatment well    Behavior During Therapy [facility] for tasks assessed/performed           Past Medical History:  Diagnosis Date   Arthritis    Bell palsy    Cancer ([place])    prostate and skin   Chronic combined systolic and diastolic CHF, NYHA class 1 ([place])    a. 07/2014 Echo: EF 35-40%, Gr 1 DD.   Complete heart block ([place])    a. 11/2010 s/p SJM 2210 [employer] DC PPM, ser# [id number].   Depression    Diabetes mellitus without complication ([place])    Fall 11-10-14   GERD (gastroesophageal reflux disease)    History of prostate cancer    Hyperlipidemia    Hypertension    LBBB (left bundle branch block)    Left-sided Bell's palsy    Lung cancer ([place]) 2016   NICM (nonischemic cardiomyopathy) ([place])    a. 07/2014 Echo: EF 35-40%, mid-apicalanteroseptal DK, Gr 1 DD, mild-mod dil LA.   Non-obstructive CAD    a. 07/2014 Abnl MV;  b. 08/2014 Cath: LM nl, LAD 30p, RI 40p, LCX nl, OM1 40, RCA dominant 30p, 70d-->Med Rx.   Poor balance    Presence of permanent cardiac pacemaker    Sleep apnea    a. cpap   Vertigo    WPW (Wolff-Parkinson-White syndrome)    a. S/P RFCA 1991.    Past Surgical History:  Procedure Laterality Date   ABDOMINAL AORTIC ENDOVASCULAR STENT GRAFT  08/25/2019   Procedure: ABDOMINAL AORTIC ENDOVASCULAR STENT GRAFT;  Surgeon: Algernon Huxley, MD;  Location:  [facility] ORS;  Service: Vascular;;   ANGIOPLASTY Left 08/25/2019   Procedure: ANGIOPLASTY;  Surgeon: Algernon Huxley, MD;  Location: [facility] ORS;  Service: Vascular;  Laterality: Left;  left SFA and stent placement   APPLICATION OF WOUND VAC Left 06/07/2015   Procedure: APPLICATION OF WOUND VAC;  Surgeon: Robert Bellow, MD;  Location: [facility] ORS;  Service: General;  Laterality: Left;  left upper back   [place]  08/26/2014   Single vessel obstructive CAD   CARPAL TUNNEL RELEASE  04-04-15   [facility]   CATARACT EXTRACTION  07-31-11 and 09-18-11   Catheter ablation  1991   for WPW   cervical fusion     CHOLECYSTECTOMY  09-07-14   ENDARTERECTOMY FEMORAL Left 08/25/2019   Procedure: ENDARTERECTOMY FEMORAL;  Surgeon: Algernon Huxley, MD;  Location: [facility] ORS;  Service: Vascular;  Laterality: Left;  common and produndis    ENDOVASCULAR REPAIR/STENT GRAFT Right 08/25/2019   Procedure: ENDOVASCULAR REPAIR/STENT GRAFT;  Surgeon: Algernon Huxley, MD;  Location: [facility] ORS;  Service: Vascular;  Laterality: Right;  renal artery   HAND SURGERY     right 1993; left 2005  HERNIA REPAIR  1955   INSERT / REPLACE / REMOVE PACEMAKER     INSERTION OF ILIAC STENT Bilateral 08/25/2019   Procedure: INSERTION OF ILIAC STENT;  Surgeon: Algernon Huxley, MD;  Location: [facility] ORS;  Service: Vascular;  Laterality: Bilateral;   JOINT REPLACEMENT Left 2013   knee   JOINT REPLACEMENT Right 2004   knee   KNEE SURGERY     left knee 1991 and 1992; right knee 1995   LEFT HEART CATHETERIZATION WITH CORONARY ANGIOGRAM N/A 08/26/2014   Procedure: LEFT HEART CATHETERIZATION WITH CORONARY ANGIOGRAM;  Surgeon: [name], MD;  [facility];  Service: Cardiovascular;  Laterality: N/A;   LOWER EXTREMITY ANGIOGRAPHY Left 08/23/2019   Procedure: Lower Extremity Angiography;  Surgeon: Algernon Huxley, MD;  Location: [place] CV LAB;  Service: Cardiovascular;  Laterality: Left;   LUMBAR  LAMINECTOMY/DECOMPRESSION MICRODISCECTOMY N/A 06/07/2014   Procedure: LUMBAR FOUR TO FIVE LUMBAR LAMINECTOMY/DECOMPRESSION MICRODISCECTOMY 1 LEVEL;  Surgeon: [name], MD;  Location: [place] [facility]URO ORS;  Service: Neurosurgery;  Laterality: N/A;   LUNG BIOPSY Right 2016   Dr [name]   PACEMAKER INSERTION     PPM-- St Jude 11/30/10 by Storm Frisk GENERATOR CHANGEOUT N/A 07/09/2019   Procedure: PPM GENERATOR CHANGEOUT;  Surgeon: Deboraha Sprang, MD;  Location: [place] CV LAB;  Service: Cardiovascular;  Laterality: N/A;   PROSTATE SURGERY     cancer--1998, prostatectomy   REPLACEMENT TOTAL KNEE     2004   ruptured disc     1962 and 1998   TEE WITHOUT CARDIOVERSION N/A 09/01/2019   Procedure: TRANSESOPHAGEAL ECHOCARDIOGRAM (TEE);  Surgeon: [name], MD;  Location: [facility] ORS;  Service: Cardiovascular;  Laterality: N/A;   TEMPORARY PACEMAKER N/A 07/09/2019   Procedure: TEMPORARY PACEMAKER;  Surgeon: Deboraha Sprang, MD;  Location: [place] CV LAB;  Service: Cardiovascular;  Laterality: N/A;   TRIGGER FINGER RELEASE  01-24-15   WOUND DEBRIDEMENT Left 06/07/2015   Procedure: DEBRIDEMENT WOUND;  Surgeon: Robert Bellow, MD;  Location: [facility] ORS;  Service: General;  Laterality: Left;  left upper back    There were no vitals filed for this visit.   Subjective Assessment - 10/31/20 1407    Subjective Patient reports that he is doing alright. No changes in symptoms since initial evaluation. He reports continued vertigo with bending forward and to the left especially. No specific questions or concerns upon arrival.    Pertinent History Pt struggles with imbalance but now he is also complaining of vertigo when he tips his head back or leans forward. This started 3-4 months ago. He has been treated at this clinic in the past for bilateral posterior canal BPPV but more significant on the L side.  He reports that he lost consciousness a few months ago, fell, and landed with his head against the  cabinet. Denies any injuries from this fall. Pt did have vascular surgery in Sept 2020. Patient had difficulty ambulating prior to this for a "few months", and eventually was hospitalized for cellulitis of his L toe d/t decreased blood flow. Following vascular surgery in Sept 2020, patient had HHPT for 6 weeks followed by OP PT. Patient has been using a SPC for ambulation intermittently for multiple years. He has a history of DMII with peripheral neuropathy in L foot.    Patient Stated Goals Improve vertigo    Currently in Pain? No/denies            TREATMENT  Canalith Repositioning Treatment Performed Dix-Hallpike testing which is negative on the R side for either vertigo or nystagmus. L side is positive for a few upbeating L torsional beats of nystagmus with mild, fleeting vertigo reported by patient. Nystagmus lasts 3-5s. Pt treated with 3 bouts of Epley Maneuver for presumed L posterior canal BPPV. 1 minute holds in each position and retesting between maneuvers. After first maneuver L Dix-Hallpike is negative for both vertigo and nystagmus. Performed 2 additional bouts of Epley maneuver for the L side. Retesting is once again negative after second maneuver for both vertigo and nystagmus. Will perform additional BPPV testing and treatment at next session as necessary.               PT Short Term Goals - 10/26/20 1153      PT SHORT TERM GOAL #1   Title Pt will be independent with HEP in order to improve vertogp. strength, and balance in order to decrease fall risk and improve symptom-free function at home.    Time 4    Period Weeks    Status New    Target Date 11/23/20             PT Long Term Goals - 10/26/20 1154      PT LONG TERM GOAL #1   Title Pt will report no further bouts of dizziness/vertigo when tipping his head back or leaning forward in order to improve symptom-free function at home and decrease risk for falls    Time 8    Period Weeks    Status New     Target Date 12/20/20      PT LONG TERM GOAL #2   Title Pt will decrease DHI score by at least 18 points in order to demonstrate clinically significant reduction in disability    Baseline 10/25/20: 28/100    Time 8    Period Weeks    Status New    Target Date 12/21/20      PT LONG TERM GOAL #3   Title Pt will improve ABC by at least 13% in order to demonstrate clinically significant improvement in balance confidence.    Baseline 10/25/20: 55.625%    Time 8    Period Weeks    Status New    Target Date 12/20/20      PT LONG TERM GOAL #4   Title Pt will improve FOTO score to at least 58 in order to demonstrate significant improvement in function    Baseline 10/25/20: 55    Time 8    Period Weeks    Status New    Target Date 12/21/20                 Plan - 10/31/20 1409    Clinical Impression Statement Performed Dix-Hallpike testing which is negative on the R side for either vertigo or nystagmus. L side is positive for a few upbeating L torsional beats of nystagmus with mild, fleeting vertigo reported by patient. Nystagmus lasts 3-5s. Pt treated with 3 bouts of Epley Maneuver for presumed L posterior canal BPPV. 1 minute holds in each position and retesting between maneuvers. After first maneuver L Dix-Hallpike is negative for both vertigo and nystagmus. Performed 2 additional bouts of Epley maneuver for the L side. Retesting is once again negative after second maneuver for both vertigo and nystagmus. Will perform additional BPPV testing and treatment at next session as necessary.    Personal Factors and Comorbidities Age;Comorbidity 3+;Past/Current Experience;Time since onset of  injury/illness/exacerbation    Comorbidities DM2 with periphreal nueropathy, GERD, HTN, HLD, past lung cancer, arthritis, vertigo, PAD    Examination-Activity Limitations Locomotion Level    Examination-Participation Restrictions Yard Work;Community Activity;Church    Stability/Clinical Decision Making  Unstable/Unpredictable    Rehab Potential Good    PT Frequency 1x / week    PT Duration 8 weeks    PT Treatment/Interventions ADLs/Self Care Home Management;Electrical Stimulation;Therapeutic activities;Patient/family education;Therapeutic exercise;DME Instruction;Gait training;Stair training;Moist Heat;Cryotherapy;Ultrasound;Aquatic Therapy;Functional mobility training;Manual techniques;Dry needling;Passive range of motion;Neuromuscular re-education;Balance training;Vestibular;Joint Manipulations;Spinal Manipulations;Energy conservation;Biofeedback;Canalith Repostioning;Iontophoresis 4mg /ml Dexamethasone;Traction    PT Next Visit Plan Recheck Dix-Hallpike and treat as necessary, once vertigo resolved screen balance and treat as necessary    PT Home Exercise Plan None currently    Consulted and Agree with Plan of Care Patient           Patient will benefit from skilled therapeutic intervention in order to improve the following deficits and impairments:  Dizziness, Difficulty walking, Decreased balance  Visit Diagnosis: Dizziness and giddiness  History of falling     Problem List Patient Active Problem List   Diagnosis Date Noted   AAA (abdominal aortic aneurysm) without rupture ([place]) 12/10/2019   Renal artery stenosis ([place]) 12/10/2019   PAD (peripheral artery disease) ([place]) 12/10/2019   Thromboembolism ([place]) 08/30/2019   Atherosclerosis of native arteries of extremity with intermittent claudication ([place]) 08/23/2019   Swelling of limb 08/17/2019   Pain in limb 08/17/2019   NICM (nonischemic cardiomyopathy) ([place]) 07/01/2019   CHF (congestive heart failure) ([place]) 07/01/2019   Malignant neoplasm of right lung ([place]) 04/18/2016   CAD (coronary artery disease) 04/01/2016   Adenocarcinoma ([place]) 04/01/2016   Skin cyst 12/26/2015   GERD (gastroesophageal reflux disease) 06/16/2015   Abscess of back 06/06/2015   Vertigo 03/27/2015   Carpal tunnel syndrome 10/28/2014    Status post cholecystectomy 09/29/2014   Disease of digestive tract 09/29/2014   Cardiomyopathy ([place]) 09/26/2014   Type 2 diabetes mellitus without complications ([place])    Sleep apnea    Spinal stenosis, lumbar region, with neurogenic claudication 06/07/2014   Lumbar stenosis with neurogenic claudication 06/07/2014   Abnormal gait 08/20/2012   H/O total knee replacement 08/20/2012   Arthritis of knee, degenerative 08/20/2012   Pacemaker-St.Jude 08/03/2012   Cardiac conduction disorder 06/19/2012   Acid reflux 06/18/2012   Nodal rhythm disorder 06/18/2012   Triggering of digit 03/25/2012   Hyperlipidemia 12/23/2011   Essential hypertension 03/23/2011   Atrioventricular block, complete ([place]) 03/23/2011   Complete atrioventricular block ([place]) 03/23/2011   Phillips Grout PT, DPT, GCS  Deaisha Welborn 10/31/2020, 3:18 PM  [place] MAIN [facility] SERVICES [address] [place], [place], [address] Phone: [phone]   Fax:  [phone]  Name: Howard [name] MRN: [id number] Date of Birth: [birth year]

## 2020-11-06 ENCOUNTER — Ambulatory Visit: Payer: Medicare Other

## 2020-11-06 ENCOUNTER — Other Ambulatory Visit: Payer: Self-pay

## 2020-11-06 DIAGNOSIS — R42 Dizziness and giddiness: Secondary | ICD-10-CM | POA: Diagnosis not present

## 2020-11-06 DIAGNOSIS — Z9181 History of falling: Secondary | ICD-10-CM | POA: Diagnosis not present

## 2020-11-06 NOTE — Therapy (Signed)
[place] MAIN [facility] SERVICES [address] [place], [place], [address] Phone: [phone]   Fax:  [phone]  Physical Therapy Treatment/Discharge  Patient Details  Name: Howard [name] MRN: [id number] Date of Birth: 1931/06/30 Referring Provider (PT): [name] [place]   Encounter Date: 11/06/2020   PT End of Session - 11/06/20 8588    Visit Number 3    Number of Visits 9    Date for PT Re-Evaluation 12/20/20    Authorization Type eval: 10/25/20    PT Start Time 1642    PT Stop Time 1710    PT Time Calculation (min) 28 min    Activity Tolerance Patient tolerated treatment well    Behavior During Therapy [facility] for tasks assessed/performed           Past Medical History:  Diagnosis Date  . Arthritis   . Bell palsy   . Cancer [facility])    prostate and skin  . Chronic combined systolic and diastolic CHF, NYHA class 1 ([place])    a. 07/2014 Echo: EF 35-40%, Gr 1 DD.  Marland Kitchen Complete heart block ([place])    a. 11/2010 s/p SJM 2210 Accent DC PPM, ser# [id number].  . Depression   . Diabetes mellitus without complication ([place])   . Fall 11-10-14  . GERD (gastroesophageal reflux disease)   . History of prostate cancer   . Hyperlipidemia   . Hypertension   . LBBB (left bundle branch block)   . Left-sided Bell's palsy   . Lung cancer ([place]) 2016  . NICM (nonischemic cardiomyopathy) ([place])    a. 07/2014 Echo: EF 35-40%, mid-apicalanteroseptal DK, Gr 1 DD, mild-mod dil LA.  . Non-obstructive CAD    a. 07/2014 Abnl MV;  b. 08/2014 Cath: LM nl, LAD 30p, RI 40p, LCX nl, OM1 40, RCA dominant 30p, 70d-->Med Rx.  Marland Kitchen Poor balance   . Presence of permanent cardiac pacemaker   . Sleep apnea    a. cpap  . Vertigo   . WPW (Wolff-Parkinson-White syndrome)    a. S/P RFCA 1991.    Past Surgical History:  Procedure Laterality Date  . ABDOMINAL AORTIC ENDOVASCULAR STENT GRAFT  08/25/2019   Procedure: ABDOMINAL AORTIC ENDOVASCULAR STENT GRAFT;  Surgeon: Algernon Huxley, MD;   Location: [facility] ORS;  Service: Vascular;;  . ANGIOPLASTY Left 08/25/2019   Procedure: ANGIOPLASTY;  Surgeon: Algernon Huxley, MD;  Location: [facility] ORS;  Service: Vascular;  Laterality: Left;  left SFA and stent placement  . APPLICATION OF WOUND VAC Left 06/07/2015   Procedure: APPLICATION OF WOUND VAC;  Surgeon: Robert Bellow, MD;  Location: [facility] ORS;  Service: General;  Laterality: Left;  left upper back  . BACK SURGERY     2011  . CARDIAC CATHETERIZATION  08/26/2014   Single vessel obstructive CAD  . CARPAL TUNNEL RELEASE  04-04-15   [facility]  . CATARACT EXTRACTION  07-31-11 and 09-18-11  . Catheter ablation  1991   for WPW  . cervical fusion    . CHOLECYSTECTOMY  09-07-14  . ENDARTERECTOMY FEMORAL Left 08/25/2019   Procedure: ENDARTERECTOMY FEMORAL;  Surgeon: Algernon Huxley, MD;  Location: [facility] ORS;  Service: Vascular;  Laterality: Left;  common and produndis   . ENDOVASCULAR REPAIR/STENT GRAFT Right 08/25/2019   Procedure: ENDOVASCULAR REPAIR/STENT GRAFT;  Surgeon: Algernon Huxley, MD;  Location: [facility] ORS;  Service: Vascular;  Laterality: Right;  renal artery  . HAND SURGERY     right 1993; left 2005  .  HERNIA REPAIR  1955  . INSERT / REPLACE / REMOVE PACEMAKER    . INSERTION OF ILIAC STENT Bilateral 08/25/2019   Procedure: INSERTION OF ILIAC STENT;  Surgeon: Algernon Huxley, MD;  Location: [facility] ORS;  Service: Vascular;  Laterality: Bilateral;  . JOINT REPLACEMENT Left 2013   knee  . JOINT REPLACEMENT Right 2004   knee  . KNEE SURGERY     left knee 1991 and 1992; right knee 1995  . LEFT HEART CATHETERIZATION WITH CORONARY ANGIOGRAM N/A 08/26/2014   Procedure: LEFT HEART CATHETERIZATION WITH CORONARY ANGIOGRAM;  Surgeon: [name], MD;  Location: [facility] CATH LAB;  Service: Cardiovascular;  Laterality: N/A;  . LOWER EXTREMITY ANGIOGRAPHY Left 08/23/2019   Procedure: Lower Extremity Angiography;  Surgeon: Algernon Huxley, MD;  Location: [place] CV LAB;  Service: Cardiovascular;  Laterality: Left;  . LUMBAR  LAMINECTOMY/DECOMPRESSION MICRODISCECTOMY N/A 06/07/2014   Procedure: LUMBAR FOUR TO FIVE LUMBAR LAMINECTOMY/DECOMPRESSION MICRODISCECTOMY 1 LEVEL;  Surgeon: [name], MD;  Location: [place] NEURO ORS;  Service: Neurosurgery;  Laterality: N/A;  . LUNG BIOPSY Right 2016   Dr [name]  . PACEMAKER INSERTION     PPM-- St Jude 11/30/10 by [name]  . PPM GENERATOR CHANGEOUT N/A 07/09/2019   Procedure: PPM GENERATOR CHANGEOUT;  Surgeon: Deboraha Sprang, MD;  Location: [place] CV LAB;  Service: Cardiovascular;  Laterality: N/A;  . PROSTATE SURGERY     cancer--1998, prostatectomy  . REPLACEMENT TOTAL KNEE     2004  . ruptured disc     1962 and 1998  . TEE WITHOUT CARDIOVERSION N/A 09/01/2019   Procedure: TRANSESOPHAGEAL ECHOCARDIOGRAM (TEE);  Surgeon: [name], MD;  Location: [facility] ORS;  Service: Cardiovascular;  Laterality: N/A;  . TEMPORARY PACEMAKER N/A 07/09/2019   Procedure: TEMPORARY PACEMAKER;  Surgeon: Deboraha Sprang, MD;  Location: [place] CV LAB;  Service: Cardiovascular;  Laterality: N/A;  . TRIGGER FINGER RELEASE  01-24-15  . WOUND DEBRIDEMENT Left 06/07/2015   Procedure: DEBRIDEMENT WOUND;  Surgeon: Robert Bellow, MD;  Location: [facility] ORS;  Service: General;  Laterality: Left;  left upper back    There were no vitals filed for this visit.   Subjective Assessment - 11/06/20 1646    Subjective Patient reports that he is doing alright. He has not had any further episodes of vertigo since the last therapy session. No specific questions or concerns currently.    Pertinent History Pt struggles with imbalance but now he is also complaining of vertigo when he tips his head back or leans forward. This started 3-4 months ago. He has been treated at this clinic in the past for bilateral posterior canal BPPV but more significant on the L side.  He reports that he lost consciousness a few months ago, fell, and landed with his head against the cabinet. Denies any injuries from this fall. Pt did  have vascular surgery in Sept 2020. Patient had difficulty ambulating prior to this for a "few months", and eventually was hospitalized for cellulitis of his L toe d/t decreased blood flow. Following vascular surgery in Sept 2020, patient had HHPT for 6 weeks followed by OP PT. Patient has been using a SPC for ambulation intermittently for multiple years. He has a history of DMII with peripheral neuropathy in L foot.    Patient Stated Goals Improve vertigo    Currently in Pain? No/denies            TREATMENT   Canalith Repositioning Treatment Performed  Dix-Hallpike testing which is negative bilaterally for both vertigo or nystagmus. Given previous positive test pt taken through one bout of L Epley maneuver with one minute holds in each position. Pt completed ABC, DHI, and FOTO. Updated goals with patient and discussed discharge instructions.                 PT Education - 11/06/20 1707    Education Details Discharge    Person(s) Educated Patient    Methods Explanation    Comprehension Verbalized understanding            PT Short Term Goals - 11/06/20 1648      PT SHORT TERM GOAL #1   Title Pt will be independent with HEP in order to improve vertogp. strength, and balance in order to decrease fall risk and improve symptom-free function at home.    Time 4    Period Weeks    Status Achieved             PT Long Term Goals - 11/06/20 1650      PT LONG TERM GOAL #1   Title Pt will report no further bouts of dizziness/vertigo when tipping his head back or leaning forward in order to improve symptom-free function at home and decrease risk for falls    Baseline 11/06/20: No further episodes of vertigo    Time 8    Period Weeks    Status Achieved      PT LONG TERM GOAL #2   Title Pt will decrease DHI score by at least 18 points in order to demonstrate clinically significant reduction in disability    Baseline 10/25/20: 28/100; 11/06/20: 30/100    Time 8    Period  Weeks    Status Not Met      PT LONG TERM GOAL #3   Title Pt will improve ABC by at least 13% in order to demonstrate clinically significant improvement in balance confidence.    Baseline 10/25/20: 55.625%; 11/06/20: 65%    Time 8    Period Weeks    Status Partially Met      PT LONG TERM GOAL #4   Title Pt will improve FOTO score to at least 58 in order to demonstrate significant improvement in function    Baseline 10/25/20: 55; 11/06/20: 64    Time 8    Period Weeks    Status Achieved                 Plan - 11/06/20 1647    Clinical Impression Statement Pt reports no further episodes of vertigo since last therapy session. Performed Dix-Hallpike testing which is negative bilaterally for both vertigo or nystagmus. Given previous positive test pt taken through one bout of L Epley maneuver with one minute holds in each position. Pt completed ABC, DHI, and FOTO. His ABC improved to 65% and his FOTO improved to 64. Offered to perform additional balance testing however he declines on this date. Pt reports that he is ready to discharge today. Pt provided instructions about how to follow-up if symptoms recur.    Personal Factors and Comorbidities Age;Comorbidity 3+;Past/Current Experience;Time since onset of injury/illness/exacerbation    Comorbidities DM2 with periphreal nueropathy, GERD, HTN, HLD, past lung cancer, arthritis, vertigo, PAD    Examination-Activity Limitations Locomotion Level    Examination-Participation Restrictions Yard Work;Community Activity;Church    Stability/Clinical Decision Making Unstable/Unpredictable    Rehab Potential Good    PT Frequency 1x / week    PT  Duration 8 weeks    PT Treatment/Interventions ADLs/Self Care Home Management;Electrical Stimulation;Therapeutic activities;Patient/family education;Therapeutic exercise;DME Instruction;Gait training;Stair training;Moist Heat;Cryotherapy;Ultrasound;Aquatic Therapy;Functional mobility training;Manual  techniques;Dry needling;Passive range of motion;Neuromuscular re-education;Balance training;Vestibular;Joint Manipulations;Spinal Manipulations;Energy conservation;Biofeedback;Canalith Repostioning;Iontophoresis 4mg /ml Dexamethasone;Traction    PT Next Visit Plan Recheck Dix-Hallpike and treat as necessary, once vertigo resolved screen balance and treat as necessary    PT Home Exercise Plan None currently    Consulted and Agree with Plan of Care Patient           Patient will benefit from skilled therapeutic intervention in order to improve the following deficits and impairments:  Dizziness, Difficulty walking, Decreased balance  Visit Diagnosis: Dizziness and giddiness     Problem List Patient Active Problem List   Diagnosis Date Noted  . AAA (abdominal aortic aneurysm) without rupture ([place]) 12/10/2019  . Renal artery stenosis ([place]) 12/10/2019  . PAD (peripheral artery disease) ([place]) 12/10/2019  . Thromboembolism ([place]) 08/30/2019  . Atherosclerosis of native arteries of extremity with intermittent claudication ([place]) 08/23/2019  . Swelling of limb 08/17/2019  . Pain in limb 08/17/2019  . NICM (nonischemic cardiomyopathy) ([place]) 07/01/2019  . CHF (congestive heart failure) ([place]) 07/01/2019  . Malignant neoplasm of right lung ([place]) 04/18/2016  . CAD (coronary artery disease) 04/01/2016  . Adenocarcinoma ([place]) 04/01/2016  . Skin cyst 12/26/2015  . GERD (gastroesophageal reflux disease) 06/16/2015  . Abscess of back 06/06/2015  . Vertigo 03/27/2015  . Carpal tunnel syndrome 10/28/2014  . Status post cholecystectomy 09/29/2014  . Disease of digestive tract 09/29/2014  . Cardiomyopathy ([place]) 09/26/2014  . Type 2 diabetes mellitus without complications ([place])   . Sleep apnea   . Spinal stenosis, lumbar region, with neurogenic claudication 06/07/2014  . Lumbar stenosis with neurogenic claudication 06/07/2014  . Abnormal gait 08/20/2012  . H/O total knee replacement 08/20/2012  .  Arthritis of knee, degenerative 08/20/2012  . Pacemaker-St.Jude 08/03/2012  . Cardiac conduction disorder 06/19/2012  . Acid reflux 06/18/2012  . 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 Triggering of digit 03/25/2012  . Hyperlipidemia 12/23/2011  . Essential hypertension 03/23/2011  . Atrioventricular block, complete ([place]) 03/23/2011  . Complete atrioventricular block ([place]) 03/23/2011   Phillips Grout PT, DPT, GCS  Agam Davenport 11/06/2020, 5:11 PM  [facility] [place] MAIN [facility] SERVICES [address] [place], [place], [address] Phone: [phone]   Fax:  [phone]  Name: Howard [name] MRN: [id number] Date of Birth: Feb 12, 1931

## 2020-11-07 DIAGNOSIS — C44329 Squamous cell carcinoma of skin of other parts of face: Secondary | ICD-10-CM | POA: Diagnosis not present

## 2020-11-07 DIAGNOSIS — C44222 Squamous cell carcinoma of skin of right ear and external auricular canal: Secondary | ICD-10-CM | POA: Diagnosis not present

## 2020-11-07 DIAGNOSIS — Z85828 Personal history of other malignant neoplasm of skin: Secondary | ICD-10-CM | POA: Diagnosis not present

## 2020-11-07 NOTE — Progress Notes (Addendum)
Subjective:   Howard [name] is a 86 y.o. male who presents for Medicare Annual/Subsequent preventive examination.  I connected with Howard [name] and Howard [name] today by telephone and verified that I am speaking with the correct person using two identifiers. Location patient: home Location provider: work Persons participating in the virtual visit: patient, provider.   I discussed the limitations, risks, security and privacy concerns of performing an evaluation and management service by telephone and the availability of in person appointments. I also discussed with the patient that there may be a patient responsible charge related to this service. The patient expressed understanding and verbally consented to this telephonic visit.    Interactive audio and video telecommunications were attempted between this provider and patient, however failed, due to patient having technical difficulties OR patient did not have access to video capability.  We continued and completed visit with audio only.   Review of Systems    N/A  Cardiac Risk Factors include: diabetes mellitus;advanced age (>55men, >46 women);male gender;hypertension;dyslipidemia     Objective:    Today's Vitals   11/08/20 1023  PainSc: 2    There is no height or weight on file to calculate BMI.  Advanced Directives 11/08/2020 06/21/2020 06/08/2020 05/24/2020 11/08/2019 09/01/2019 08/30/2019  Does Patient Have a Medical Advance Directive? Yes Yes Yes Yes Yes Yes Yes  Type of Paramedic of [place];Living will [place];Living will [place];Living will [place];Living will [place];Living will Healthcare Power of [place];Living will  Does patient want to make changes to medical advance directive? - No - Patient declined No - Patient declined No - Patient declined - No - Patient declined No - Patient declined  Copy  of [place] in Chart? Yes - validated most recent copy scanned in chart (See row information) No - copy requested No - copy requested No - copy requested Yes - validated most recent copy scanned in chart (See row information) No - copy requested -  Would patient like information on creating a medical advance directive? - No - Patient declined No - Patient declined - - No - Patient declined No - Patient declined    Current Medications (verified) Outpatient Encounter Medications as of 11/08/2020  Medication Sig   acetaminophen (TYLENOL) 500 MG tablet Take 1,000 mg by mouth every 6 (six) hours as needed.   aspirin EC 81 MG tablet Take 81 mg by mouth daily.   Cholecalciferol (VITAMIN D3) 1000 UNITS CAPS Take 1,000 Units by mouth daily.    doxycycline (VIBRAMYCIN) 100 MG capsule Take 1 capsule by mouth 2 (two) times daily. With food   ELIQUIS 5 MG TABS tablet TAKE 1 TABLET TWICE A DAY  (SWITCHED FROM PLAVIX)   ENTRESTO 24-26 MG TAKE 1 TABLET TWICE A DAY   isosorbide mononitrate (IMDUR) 30 MG 24 hr tablet TAKE 1 TABLET DAILY   metFORMIN (GLUCOPHAGE) 500 MG tablet TAKE 1 TABLET TWICE DAILY  WITH MEALS   metoprolol succinate (TOPROL-XL) 25 MG 24 hr tablet Take 1 tablet (25 mg total) by mouth at bedtime.   montelukast (SINGULAIR) 10 MG tablet Take 10 mg by mouth daily as needed (sneezing/allergies.).    Multiple Vitamin (MULTIVITAMIN WITH MINERALS) TABS tablet Take 1 tablet by mouth daily. One-A-Day Multivitamin   omeprazole (PRILOSEC) 40 MG capsule TAKE 1 CAPSULE TWICE DAILY AS NEEDED   simvastatin (ZOCOR) 40 MG tablet TAKE 1 TABLET AT BEDTIME  spironolactone (ALDACTONE) 25 MG tablet TAKE 1 TABLET DAILY (DOSE  DECREASE) (Patient taking differently: Take 50 mg by mouth daily. )   triamcinolone (NASACORT) 55 MCG/ACT AERO nasal inhaler 2 sprays daily.   ezetimibe (ZETIA) 10 MG tablet Take 1 tablet (10 mg total) by mouth daily. (Patient not taking: Reported on 11/08/2020)    fluticasone (FLONASE) 50 MCG/ACT nasal spray Place 2 sprays into both nostrils daily. (Patient not taking: Reported on 11/08/2020)   No facility-administered encounter medications on file as of 11/08/2020.    Allergies (verified) Sulfa antibiotics   History: Past Medical History:  Diagnosis Date   Arthritis    Bell palsy    Cancer ([place])    prostate and skin   Chronic combined systolic and diastolic CHF, NYHA class 1 ([place])    a. 07/2014 Echo: EF 35-40%, Gr 1 DD.   Complete heart block ([place])    a. 11/2010 s/p SJM 2210 [facility] DC PPM, ser# [id number].   Depression    Diabetes mellitus without complication ([place])    Fall 11-10-14   GERD (gastroesophageal reflux disease)    History of prostate cancer    Hyperlipidemia    Hypertension    LBBB (left bundle branch block)    Left-sided Bell's palsy    Lung cancer ([place]) 2016   NICM (nonischemic cardiomyopathy) ([place])    a. 07/2014 Echo: EF 35-40%, mid-apicalanteroseptal DK, Gr 1 DD, mild-mod dil LA.   Non-obstructive CAD    a. 07/2014 Abnl MV;  b. 08/2014 Cath: LM nl, LAD 30p, RI 40p, LCX nl, OM1 40, RCA dominant 30p, 70d-->Med Rx.   Poor balance    Presence of permanent cardiac pacemaker    Sleep apnea    a. cpap   Vertigo    WPW (Wolff-Parkinson-White syndrome)    a. S/P RFCA 1991.   Past Surgical History:  Procedure Laterality Date   ABDOMINAL AORTIC ENDOVASCULAR STENT GRAFT  08/25/2019   Procedure: ABDOMINAL AORTIC ENDOVASCULAR STENT GRAFT;  Surgeon: Algernon Huxley, MD;  Location: [facility] ORS;  Service: Vascular;;   ANGIOPLASTY Left 08/25/2019   Procedure: ANGIOPLASTY;  Surgeon: Algernon Huxley, MD;  Location: [facility] ORS;  Service: Vascular;  Laterality: Left;  left SFA and stent placement   APPLICATION OF WOUND VAC Left 06/07/2015   Procedure: APPLICATION OF WOUND VAC;  Surgeon: Robert Bellow, MD;  Location: [facility] ORS;  Service: General;  Laterality: Left;  left upper back   [place]  08/26/2014   Single  vessel obstructive CAD   CARPAL TUNNEL RELEASE  04-04-15   [facility]   CATARACT EXTRACTION  07-31-11 and 09-18-11   Catheter ablation  1991   for WPW   cervical fusion     CHOLECYSTECTOMY  09-07-14   ENDARTERECTOMY FEMORAL Left 08/25/2019   Procedure: ENDARTERECTOMY FEMORAL;  Surgeon: Algernon Huxley, MD;  Location: [facility] ORS;  Service: Vascular;  Laterality: Left;  common and produndis    ENDOVASCULAR REPAIR/STENT GRAFT Right 08/25/2019   Procedure: ENDOVASCULAR REPAIR/STENT GRAFT;  Surgeon: Algernon Huxley, MD;  Location: [facility] OR[facility];  Service: Vascular;  Laterality: Right;  renal artery   HAND SURGERY     right 1993; left 2005   [place] / REPLACE / REMOVE PACEMAKER     INSERTION OF ILIAC STENT Bilateral 08/25/2019   Procedure: INSERTION OF ILIAC STENT;  Surgeon: Algernon Huxley, MD;  Location: [facility] OR[facility];  Service: Vascular;  Laterality: Bilateral;   JOINT REPLACEMENT Left 2013   knee   JOINT REPLACEMENT Right 2004   knee   KNEE SURGERY     left knee 1991 and 1992; right knee 1995   LEFT HEART CATHETERIZATION WITH CORONARY ANGIOGRAM N/A 08/26/2014   Procedure: LEFT HEART CATHETERIZATION WITH CORONARY ANGIOGRAM;  Surgeon: [name], MD;  Location: [facility] C[facility] LAB;  Service: Cardiovascular;  Laterality: N/A;   LOWER EXTREMITY ANGIOGRAPHY Left 08/23/2019   Procedure: Lower Extremity Angiography;  Surgeon: Algernon Huxley, MD;  Location: [place] CV LAB;  Service: Cardiovascular;  Laterality: Left;   LUMBAR LAMINECTOMY/DECOMPRESSION MICRODISCECTOMY N/A 06/07/2014   Procedure: LUMBAR FOUR TO FIVE LUMBAR LAMINECTOMY/DECOMPRESSION MICRODISCECTOMY 1 LEVEL;  Surgeon: [name], MD;  Location: [place] NEURO ORS;  Service: Neurosurgery;  Laterality: N/A;   LUNG BIOPSY Right 2016   Dr [name]   MOHS SURGERY     PACEMAKER INSERTION     PPM-- [facility] 11/30/10 by [name]   PPM GENERATOR CHANGEOUT N/A 07/09/2019   Procedure: PPM GENERATOR CHANGEOUT;  Surgeon: Deboraha Sprang, MD;  Location: [place] CV LAB;   Service: Cardiovascular;  Laterality: N/A;   PROSTATE SURGERY     cancer--1998, prostatectomy   REPLACEMENT TOTAL KNEE     2004   ruptured disc     1962 and 1998   TEE WITHOUT CARDIOVERSION N/A 09/01/2019   Procedure: TRANSESOPHAGEAL ECHOCARDIOGRAM (TEE);  Surgeon: Howard Merritts, MD;  Location: [facility] OR[facility];  Service: Cardiovascular;  Laterality: N/A;   TEMPORARY PACEMAKER N/A 07/09/2019   Procedure: TEMPORARY PACEMAKER;  Surgeon: Deboraha Sprang, MD;  Location: [facility];  Service: Cardiovascular;  Laterality: N/A;   TRIGGER FINGER RELEASE  01-24-15   WOUND DEBRIDEMENT Left 06/07/2015   Procedure: DEBRIDEMENT WOUND;  Surgeon: Robert Bellow, MD;  Location: [facility] ORS;  Service: General;  Laterality: Left;  left upper back   Family History  Problem Relation Age of Onset   Heart attack Mother    Hyperlipidemia Mother    CAD Other    Prostate cancer Neg Hx    Social History   Socioeconomic History   Marital status: Married    Spouse name: Not on file   Number of children: 2   Years of education: College   Highest education level: Some college, no degree  Occupational History   Occupation: Retired  Tobacco Use   Smoking status: Former Smoker    Years: 4.00   Smokeless tobacco: Never Used   Tobacco comment: Quit 2011  Vaping Use   Vaping Use: Never used  Substance and Sexual Activity   Alcohol use: No   Drug use: No   Sexual activity: Not on file  Other Topics Concern   Not on file  Social History Narrative   Drinks 2 cups of coffee a day    Social Determinants of Radio broadcast assistant Strain: Low Risk    Difficulty of Paying Living Expenses: Not hard at all  Food Insecurity: No Food Insecurity   Worried About Charity fundraiser in the Last Year: Never true   Arboriculturist in the Last Year: Never true  Transportation Needs: No Transportation Needs   Lack of Transportation (Medical): No   Lack of Transportation (Non-Medical): No  Physical Activity:  Inactive   Days of Exercise per Week: 0 days   Minutes of Exercise per Session: 0 min  Stress: No Stress Concern Present   Feeling of  Stress : Not at all  Social Connections: Moderately Isolated   Frequency of Communication with Friends and Family: Twice a week   Frequency of Social Gatherings with Friends and Family: Twice a week   Attends Religious Services: Never   Marine scientist or Organizations: No   Attends Music therapist: Never   Marital Status: Married    Tobacco Counseling Counseling given: Not Answered Comment: Quit 2011   Clinical Intake:  Pre-visit preparation completed: Yes  Pain : 0-10 Pain Score: 2  (Due to skin cancer removal yesterday.) Pain Type: Acute pain Pain Location: Ear (and on the right side of forehead) Pain Orientation: Right Pain Descriptors / Indicators: Aching Pain Frequency: Intermittent Pain Relieving Factors: Taking Tylenol as needed for pain.  Pain Relieving Factors: Taking Tylenol as needed for pain.  Nutritional Risks: None Diabetes: Yes  How often do you need to have someone help you when you read instructions, pamphlets, or other written materials from your doctor or pharmacy?: 1 - Never  Diabetic? Yes  Nutrition Risk Assessment:  Has the patient had any N/V/D within the last 2 months?  No  Does the patient have any non-healing wounds?  No  Has the patient had any unintentional weight loss or weight gain?  No   Diabetes:  Is the patient diabetic?  Yes  If diabetic, was a CBG obtained today?  No  Did the patient bring in their glucometer from home?  No  How often do you monitor your CBG's? Once a week.   Financial Strains and Diabetes Management:  Are you having any financial strains with the device, your supplies or your medication? No .  Does the patient want to be seen by Chronic Care Management for management of their diabetes?  No  Would the patient like to be referred to a Nutritionist or for  Diabetic Management?  No   Diabetic Exams:  Diabetic Eye Exam: Overdue for diabetic eye exam. Pt has been advised about the importance in completing this exam. Patient advised to call and schedule an eye exam.  Diabetic Foot Exam: Completed 05/05/20   Interpreter Needed?: No  Information entered by :: [facility], LPN   Activities of Daily Living In your present state of health, do you have any difficulty performing the following activities: 11/08/2020  Hearing? Y  Comment Wears bilateral hearing aids.  Vision? N  Difficulty concentrating or making decisions? N  Walking or climbing stairs? N  Dressing or bathing? N  Doing errands, shopping? N  Preparing Food and eating ? N  Using the Toilet? N  In the past six months, have you accidently leaked urine? Y  Comment Has seen urologist for issue.  Do you have problems with loss of bowel control? N  Managing your Medications? N  Managing your Finances? N  Housekeeping or managing your Housekeeping? N  Some recent data might be hidden    Patient Care Team: Chrismon, [name], PA as PCP - General (Family Medicine) Howard Merritts, MD as PCP - Cardiology (Cardiology) Deboraha Sprang, MD as PCP - Electrophysiology (Cardiology) Dasher, [name], MD as Consulting Physician (Dermatology) Birder Robson, MD as Referring Physician (Ophthalmology) Lucky Cowboy Erskine Squibb, MD as Referring Physician (Vascular Surgery) Birder Robson, MD as Referring Physician (Ophthalmology) Carloyn Manner, MD as Referring Physician (Otolaryngology) Noreene Filbert, MD as Referring Physician (Radiation Oncology) Erby Pian, MD as Referring Physician (Specialist)  Indicate any recent Medical Services you may have received from other than  [facility] providers in the past year (date may be approximate).     Assessment:   This is a routine wellness examination for Howard [name].  Hearing/Vision screen No exam data present  Dietary issues and exercise activities  discussed: Current Exercise Habits: The patient does not participate in regular exercise at present, Exercise limited by: respiratory conditions(s)  Goals      Increase water intake     Recommend increasing water intake to 6-8 8 oz glasses a day.        Depression Screen PHQ 2/9 Scores 11/08/2020 11/08/2019 02/24/2019 11/04/2018 11/03/2018 07/14/2018 04/27/2018  PHQ - 2 Score 0 0 0 0 0 0 0    Fall Risk Fall Risk  11/08/2020 11/08/2019 02/24/2019 01/25/2019 11/04/2018  Falls in the past year? 0 0 0 1 0  Number falls in past yr: 0 0 - 0 -  Injury with Fall? 0 0 - 1 -  Comment - - - abrasions and bruises  -  Risk Factor Category  - - - - -  Risk for fall due to : Impaired balance/gait - - Impaired balance/gait -  Risk for fall due to: Comment - - - - -  Follow up - - - - -    Any stairs in or around the home? Yes  If so, are there any without handrails? No  Home free of loose throw rugs in walkways, pet beds, electrical cords, etc? Yes  Adequate lighting in your home to reduce risk of falls? Yes   ASSISTIVE DEVICES UTILIZED TO PREVENT FALLS:  Life alert? No  Use of a cane, walker or w/c? No  Grab bars in the bathroom? Yes  Shower chair or bench in shower? Yes  Elevated toilet seat or a handicapped toilet? Yes    Cognitive Function: Declined at this time. Completed most of telephonic AWV with wife.      6CIT Screen 11/08/2019 11/04/2018 10/31/2017 10/24/2016  What Year? 0 points 0 points 0 points 0 points  What month? 0 points 0 points 0 points 0 points  What time? 0 points 0 points 0 points 0 points  Count back from 20 0 points 0 points 0 points 0 points  Months in reverse 0 points 0 points 2 points 0 points  Repeat phrase 0 points 0 points 2 points 6 points  Total Score 0 0 4 6    Immunizations Immunization History  Administered Date(s) Administered   Influenza, High Dose Seasonal PF 09/15/2015, 09/30/2017, 10/02/2018, 09/05/2020   Influenza, Quadrivalent, Recombinant,  Inj, Pf 10/12/2019   Influenza-Unspecified 08/23/2013, 09/15/2015, 09/12/2016   PFIZER SARS-COV-2 Vaccination 12/30/2019, 01/20/2020   Pneumococcal Conjugate-13 04/18/2016   Pneumococcal Polysaccharide-23 09/30/2017   Tdap 09/07/2011   Zoster 11/22/2013   Zoster Recombinat (Shingrix) 08/28/2018, 11/04/2018    TDAP status: Up to date Flu Vaccine status: Up to date Pneumococcal vaccine status: Up to date Covid-19 vaccine status: Completed vaccines  Qualifies for Shingles Vaccine? Yes   Zostavax completed Yes   Shingrix Completed?: Yes  Screening Tests Health Maintenance  Topic Date Due   OPHTHALMOLOGY EXAM  05/22/2019   HEMOGLOBIN A1C  11/08/2020   FOOT EXAM  05/05/2021   URINE MICROALBUMIN  05/08/2021   TETANUS/TDAP  09/06/2021   INFLUENZA VACCINE  Completed   COVID-19 Vaccine  Completed   PNA vac Low Risk Adult  Completed    Health Maintenance  Health Maintenance Due  Topic Date Due   OPHTHALMOLOGY EXAM  05/22/2019   HEMOGLOBIN A1C  11/08/2020  Colorectal cancer screening: No longer required.   Lung Cancer Screening: (Low Dose CT Chest recommended if Age 64-80 years, 30 pack-year currently smoking OR have quit w/in 15years.) does not qualify.    Additional Screening:  Vision Screening: Recommended annual ophthalmology exams for early detection of glaucoma and other disorders of the eye. Is the patient up to date with their annual eye exam?  Yes  Who is the provider or what is the name of the office in which the patient attends annual eye exams? Dr George Ina @ [facility] If pt is not established with a provider, would they like to be referred to a provider to establish care? No .   Dental Screening: Recommended annual dental exams for proper oral hygiene  Community Resource Referral / Chronic Care Management: CRR required this visit?  No   CCM required this visit?  No      Plan:     I have personally reviewed and noted the following in the patient's chart:    Medical and social history Use of alcohol, tobacco or illicit drugs  Current medications and supplements Functional ability and status Nutritional status Physical activity Advanced directives List of other physicians Hospitalizations, surgeries, and ER visits in previous 12 months Vitals Screenings to include cognitive, depression, and falls Referrals and appointments  In addition, I have reviewed and discussed with patient certain preventive protocols, quality metrics, and best practice recommendations. A written personalized care plan for preventive services as well as general preventive health recommendations were provided to patient.     Torris House [place], [place]   24/08/7352   Nurse Notes: Pt needs his Hgb A1c checked at tomorrows in office apt. Pt plans to set up an eye exam by the end of the year.   Reviewed note and plan of Nurse Health Advisor. Agree with documentation and recommendations.

## 2020-11-08 ENCOUNTER — Other Ambulatory Visit: Payer: Self-pay

## 2020-11-08 ENCOUNTER — Ambulatory Visit (INDEPENDENT_AMBULATORY_CARE_PROVIDER_SITE_OTHER): Payer: Medicare Other

## 2020-11-08 DIAGNOSIS — Z0000 Encounter for general adult medical examination without abnormal findings: Secondary | ICD-10-CM

## 2020-11-08 DIAGNOSIS — R059 Cough, unspecified: Secondary | ICD-10-CM | POA: Diagnosis not present

## 2020-11-08 DIAGNOSIS — R042 Hemoptysis: Secondary | ICD-10-CM | POA: Diagnosis not present

## 2020-11-08 DIAGNOSIS — J9811 Atelectasis: Secondary | ICD-10-CM | POA: Diagnosis not present

## 2020-11-08 NOTE — Patient Instructions (Signed)
Howard [name] , Thank you for taking time to come for your Medicare Wellness Visit. I appreciate your ongoing commitment to your health goals. Please review the following plan we discussed and let me know if I can assist you in the future.   Screening recommendations/referrals: Colonoscopy: No longer required.  Recommended yearly ophthalmology/optometry visit for glaucoma screening and checkup Recommended yearly dental visit for hygiene and checkup  Vaccinations: Influenza vaccine: Done 09/05/20 Pneumococcal vaccine: Completed series Tdap vaccine: Up to date, due 08/2021 Shingles vaccine: Completed series    Advanced directives: Currently on file.  Conditions/risks identified: Recommend increasing water intake to 6-8 8 oz glasses a day.   Next appointment: 11/09/20 @ 10:00 AM with [place] 65 Years and Older, Male Preventive care refers to lifestyle choices and visits with your health care provider that can promote health and wellness. What does preventive care include?  A yearly physical exam. This is also called an annual well check.  Dental exams once or twice a year.  Routine eye exams. Ask your health care provider how often you should have your eyes checked.  Personal lifestyle choices, including:  Daily care of your teeth and gums.  Regular physical activity.  Eating a healthy diet.  Avoiding tobacco and drug use.  Limiting alcohol use.  Practicing safe sex.  Taking low doses of aspirin every day.  Taking vitamin and mineral supplements as recommended by your health care provider. What happens during an annual well check? The services and screenings done by your health care provider during your annual well check will depend on your age, overall health, lifestyle risk factors, and family history of disease. Counseling  Your health care provider may ask you questions about your:  Alcohol use.  Tobacco use.  Drug use.  Emotional  well-being.  Home and relationship well-being.  Sexual activity.  Eating habits.  History of falls.  Memory and ability to understand (cognition).  Work and work Statistician. Screening  You may have the following tests or measurements:  Height, weight, and BMI.  Blood pressure.  Lipid and cholesterol levels. These may be checked every 5 years, or more frequently if you are over 84 years old.  Skin check.  Lung cancer screening. You may have this screening every year starting at age 84 if you have a 30-pack-year history of smoking and currently smoke or have quit within the past 15 years.  Fecal occult blood test (FOBT) of the stool. You may have this test every year starting at age 84.  Flexible sigmoidoscopy or colonoscopy. You may have a sigmoidoscopy every 5 years or a colonoscopy every 10 years starting at age 84.  Prostate cancer screening. Recommendations will vary depending on your family history and other risks.  Hepatitis C blood test.  Hepatitis B blood test.  Sexually transmitted disease (STD) testing.  Diabetes screening. This is done by checking your blood sugar (glucose) after you have not eaten for a while (fasting). You may have this done every 1-3 years.  Abdominal aortic aneurysm (AAA) screening. You may need this if you are a current or former smoker.  Osteoporosis. You may be screened starting at age 84 if you are at high risk. Talk with your health care provider about your test results, treatment options, and if necessary, the need for more tests. Vaccines  Your health care provider may recommend certain vaccines, such as:  Influenza vaccine. This is recommended every year.  Tetanus, diphtheria, and acellular pertussis (  Tdap, Td) vaccine. You may need a Td booster every 10 years.  Zoster vaccine. You may need this after age 84.  Pneumococcal 13-valent conjugate (PCV13) vaccine. One dose is recommended after age 84.  Pneumococcal  polysaccharide (PPSV23) vaccine. One dose is recommended after age 84. Talk to your health care provider about which screenings and vaccines you need and how often you need them. This information is not intended to replace advice given to you by your health care provider. Make sure you discuss any questions you have with your health care provider. Document Released: 01/05/2016 Document Revised: 08/28/2016 Document Reviewed: 10/10/2015 Elsevier Interactive Patient Education  2017 [employer] Prevention in the Home Falls can cause injuries. They can happen to people of all ages. There are many things you can do to make your home safe and to help prevent falls. What can I do on the outside of my home?  Regularly fix the edges of walkways and driveways and fix any cracks.  Remove anything that might make you trip as you walk through a door, such as a raised step or threshold.  Trim any bushes or trees on the path to your home.  Use bright outdoor lighting.  Clear any walking paths of anything that might make someone trip, such as rocks or tools.  Regularly check to see if handrails are loose or broken. Make sure that both sides of any steps have handrails.  Any raised decks and porches should have guardrails on the edges.  Have any leaves, snow, or ice cleared regularly.  Use sand or salt on walking paths during winter.  Clean up any spills in your garage right away. This includes oil or grease spills. What can I do in the bathroom?  Use night lights.  Install grab bars by the toilet and in the tub and shower. Do not use towel bars as grab bars.  Use non-skid mats or decals in the tub or shower.  If you need to sit down in the shower, use a plastic, non-slip stool.  Keep the floor dry. Clean up any water that spills on the floor as soon as it happens.  Remove soap buildup in the tub or shower regularly.  Attach bath mats securely with double-sided non-slip rug  tape.  Do not have throw rugs and other things on the floor that can make you trip. What can I do in the bedroom?  Use night lights.  Make sure that you have a light by your bed that is easy to reach.  Do not use any sheets or blankets that are too big for your bed. They should not hang down onto the floor.  Have a firm chair that has side arms. You can use this for support while you get dressed.  Do not have throw rugs and other things on the floor that can make you trip. What can I do in the kitchen?  Clean up any spills right away.  Avoid walking on wet floors.  Keep items that you use a lot in easy-to-reach places.  If you need to reach something above you, use a strong step stool that has a grab bar.  Keep electrical cords out of the way.  Do not use floor polish or wax that makes floors slippery. If you must use wax, use non-skid floor wax.  Do not have throw rugs and other things on the floor that can make you trip. What can I do with my stairs?  Do  not leave any items on the stairs.  Make sure that there are handrails on both sides of the stairs and use them. Fix handrails that are broken or loose. Make sure that handrails are as long as the stairways.  Check any carpeting to make sure that it is firmly attached to the stairs. Fix any carpet that is loose or worn.  Avoid having throw rugs at the top or bottom of the stairs. If you do have throw rugs, attach them to the floor with carpet tape.  Make sure that you have a light switch at the top of the stairs and the bottom of the stairs. If you do not have them, ask someone to add them for you. What else can I do to help prevent falls?  Wear shoes that:  Do not have high heels.  Have rubber bottoms.  Are comfortable and fit you well.  Are closed at the toe. Do not wear sandals.  If you use a stepladder:  Make sure that it is fully opened. Do not climb a closed stepladder.  Make sure that both sides of the  stepladder are locked into place.  Ask someone to hold it for you, if possible.  Clearly mark and make sure that you can see:  Any grab bars or handrails.  First and last steps.  Where the edge of each step is.  Use tools that help you move around (mobility aids) if they are needed. These include:  Canes.  Walkers.  Scooters.  Crutches.  Turn on the lights when you go into a dark area. Replace any light bulbs as soon as they burn out.  Set up your furniture so you have a clear path. Avoid moving your furniture around.  If any of your floors are uneven, fix them.  If there are any pets around you, be aware of where they are.  Review your medicines with your doctor. Some medicines can make you feel dizzy. This can increase your chance of falling. Ask your doctor what other things that you can do to help prevent falls. This information is not intended to replace advice given to you by your health care provider. Make sure you discuss any questions you have with your health care provider. Document Released: 10/05/2009 Document Revised: 05/16/2016 Document Reviewed: 01/13/2015 Elsevier Interactive Patient Education  2017 [employer].

## 2020-11-09 ENCOUNTER — Ambulatory Visit (INDEPENDENT_AMBULATORY_CARE_PROVIDER_SITE_OTHER): Payer: Medicare Other | Admitting: Family Medicine

## 2020-11-09 ENCOUNTER — Encounter: Payer: Self-pay | Admitting: Family Medicine

## 2020-11-09 ENCOUNTER — Other Ambulatory Visit: Payer: Self-pay

## 2020-11-09 VITALS — BP 139/77 | HR 86 | Temp 97.8°F | Resp 16 | Wt 190.0 lb

## 2020-11-09 DIAGNOSIS — I701 Atherosclerosis of renal artery: Secondary | ICD-10-CM

## 2020-11-09 DIAGNOSIS — I509 Heart failure, unspecified: Secondary | ICD-10-CM | POA: Diagnosis not present

## 2020-11-09 DIAGNOSIS — Z950 Presence of cardiac pacemaker: Secondary | ICD-10-CM | POA: Diagnosis not present

## 2020-11-09 DIAGNOSIS — E1140 Type 2 diabetes mellitus with diabetic neuropathy, unspecified: Secondary | ICD-10-CM | POA: Diagnosis not present

## 2020-11-09 LAB — POCT GLYCOSYLATED HEMOGLOBIN (HGB A1C)
Est. average glucose Bld gHb Est-mCnc: 174
Hemoglobin A1C: 7.7 % — AB (ref 4.0–5.6)

## 2020-11-09 NOTE — Progress Notes (Signed)
Established patient visit   Patient: Howard [name]   DOB: 1931-02-13   86 y.o. Male  MRN: [id number] Visit Date: 11/09/2020  Today's healthcare provider: [name], PA   Chief Complaint  Patient presents with  . Diabetes   Subjective    HPI  Diabetes Mellitus Type II, Follow-up  Lab Results  Component Value Date   HGBA1C 7.7 (A) 11/09/2020   HGBA1C 7.4 (H) 05/08/2020   HGBA1C 7.5 (H) 12/10/2019   Wt Readings from Last 3 Encounters:  11/09/20 190 lb (86.2 kg)  10/04/20 190 lb (86.2 kg)  06/21/20 192 lb (87.1 kg)   Last seen for diabetes 6 months ago.  Management since then includes no changes. He reports good compliance with treatment. He is not having side effects.  Symptoms: No fatigue No foot ulcerations  No appetite changes No nausea  No paresthesia of the feet  No polydipsia  No polyuria No visual disturbances   No vomiting     Home blood sugar records: trend: stable Patient reports that he does not check this often.  Episodes of hypoglycemia? No    Current insulin regiment: none  Most Recent Eye Exam: due  Current exercise: no regular exercise Current diet habits: well balanced  Pertinent Labs: Lab Results  Component Value Date   CHOL 134 05/08/2020   HDL 52 05/08/2020   LDLCALC 66 05/08/2020   TRIG 83 05/08/2020   CHOLHDL 3.2 12/10/2019   Lab Results  Component Value Date   NA 137 06/05/2020   K 4.6 06/05/2020   CREATININE 1.20 09/27/2020   GFRNONAA 59 (L) 06/05/2020   GFRAA >60 06/05/2020   GLUCOSE 138 (H) 06/05/2020      Past Medical History:  Diagnosis Date  . Arthritis   . Bell palsy   . Cancer [facility])    prostate and skin  . Chronic combined systolic and diastolic CHF, NYHA class 1 ([place])    a. 07/2014 Echo: EF 35-40%, Gr 1 DD.  Marland Kitchen Complete heart block ([name])    a. 11/2010 s/p SJM 2210 [employer] DC PPM, ser# [id number].  . Depression   . Diabetes mellitus without complication ([place])   . Fall 11-10-14  . GERD (gastroesophageal  reflux disease)   . History of prostate cancer   . Hyperlipidemia   . Hypertension   . LBBB (left bundle branch block)   . Left-sided Bell's palsy   . Lung cancer ([place]) 2016  . NICM (nonischemic cardiomyopathy) ([place])    a. 07/2014 Echo: EF 35-40%, mid-apicalanteroseptal DK, Gr 1 DD, mild-mod dil LA.  . Non-obstructive CAD    a. 07/2014 Abnl MV;  b. 08/2014 Cath: LM nl, LAD 30p, RI 40p, LCX nl, OM1 40, RCA dominant 30p, 70d-->Med Rx.  Marland Kitchen Poor balance   . Presence of permanent cardiac pacemaker   . Sleep apnea    a. cpap  . Vertigo   . WPW (Wolff-Parkinson-White syndrome)    a. S/P RFCA 1991.   Past Surgical History:  Procedure Laterality Date  . ABDOMINAL AORTIC ENDOVASCULAR STENT GRAFT  08/25/2019   Procedure: ABDOMINAL AORTIC ENDOVASCULAR STENT GRAFT;  Surgeon: Algernon Huxley, MD;  Location: [facility] ORS;  Service: Vascular;;  . ANGIOPLASTY Left 08/25/2019   Procedure: ANGIOPLASTY;  Surgeon: Algernon Huxley, MD;  Location: [facility] ORS;  Service: Vascular;  Laterality: Left;  left SFA and stent placement  . APPLICATION OF WOUND VAC Left 06/07/2015   Procedure: APPLICATION OF WOUND VAC;  Surgeon: Dellis Filbert  Amedeo Kinsman, MD;  Location: [facility] ORS;  Service: General;  Laterality: Left;  left upper back  . BACK SURGERY     2011  . CARDIAC CATHETERIZATION  08/26/2014   Single vessel obstructive CAD  . CARPAL TUNNEL RELEASE  04-04-15   [facility]  . CATARACT EXTRACTION  07-31-11 and 09-18-11  . Catheter ablation  1991   for WPW  . cervical fusion    . CHOLECYSTECTOMY  09-07-14  . ENDARTERECTOMY FEMORAL Left 08/25/2019   Procedure: ENDARTERECTOMY FEMORAL;  Surgeon: Algernon Huxley, MD;  Location: [facility] ORS;  Service: Vascular;  Laterality: Left;  common and produndis   . ENDOVASCULAR REPAIR/STENT GRAFT Right 08/25/2019   Procedure: ENDOVASCULAR REPAIR/STENT GRAFT;  Surgeon: Algernon Huxley, MD;  Location: [facility] ORS;  Service: Vascular;  Laterality: Right;  renal artery  . HAND SURGERY     right 1993; left 2005  . HERNIA REPAIR   1955  . INSERT / REPLACE / REMOVE PACEMAKER    . INSERTION OF ILIAC STENT Bilateral 08/25/2019   Procedure: INSERTION OF ILIAC STENT;  Surgeon: Algernon Huxley, MD;  Location: [facility] ORS;  Service: Vascular;  Laterality: Bilateral;  . JOINT REPLACEMENT Left 2013   knee  . JOINT REPLACEMENT Right 2004   knee  . KNEE SURGERY     left knee 1991 and 1992; right knee 1995  . LEFT HEART CATHETERIZATION WITH CORONARY ANGIOGRAM N/A 08/26/2014   Procedure: LEFT HEART CATHETERIZATION WITH CORONARY ANGIOGRAM;  Surgeon: [name], MD;  Location[facility] CATH LAB;  Service: Cardiovascular;  Laterality: N/A;  . LOWER EXTREMITY ANGIOGRAPHY Left 08/23/2019   Procedure: Lower Extremity Angiography;  Surgeon: Algernon Huxley, MD;  Location: [place] CV LAB;  Service: Cardiovascular;  Laterality: Left;  . LUMBAR LAMINECTOMY/DECOMPRESSION MICRODISCECTOMY N/A 06/07/2014   Procedure: LUMBAR FOUR TO FIVE LUMBAR LAMINECTOMY/DECOMPRESSION MICRODISCECTOMY 1 LEVEL;  Surgeon: [name], MD;  Location: [place] NEURO ORS;  Service: Neurosurgery;  Laterality: N/A;  . LUNG BIOPSY Right 2016   Dr [name]  . MOHS SURGERY    . PACEMAKER INSERTION     PPM-- St Jude 11/30/10 by [name]  . PPM GENERATOR CHANGEOUT N/A 07/09/2019   Procedure: PPM GENERATOR CHANGEOUT;  Surgeon: Deboraha Sprang, MD;  Location: [place] CV LAB;  Service: Cardiovascular;  Laterality: N/A;  . PROSTATE SURGERY     cancer--1998, prostatectomy  . REPLACEMENT TOTAL KNEE     2004  . ruptured disc     1962 and 1998  . TEE WITHOUT CARDIOVERSION N/A 09/01/2019   Procedure: TRANSESOPHAGEAL ECHOCARDIOGRAM (TEE);  Surgeon: [name], MD;  Location: [facility] ORS;  Service: Cardiovascular;  Laterality: N/A;  . TEMPORARY PACEMAKER N/A 07/09/2019   Procedure: TEMPORARY PACEMAKER;  Surgeon: Deboraha Sprang, MD;  Location: [place] CV LAB;  Service: Cardiovascular;  Laterality: N/A;  . TRIGGER FINGER RELEASE  01-24-15  . WOUND DEBRIDEMENT Left 06/07/2015   Procedure:  DEBRIDEMENT WOUND;  Surgeon: Robert Bellow, MD;  Location: [facility] ORS;  Service: General;  Laterality: Left;  left upper back   Social History   Tobacco Use  . Smoking status: Former Smoker    Years: 4.00  . Smokeless tobacco: Never Used  . Tobacco comment: Quit 2011  Vaping Use  . Vaping Use: Never used  Substance Use Topics  . Alcohol use: No  . Drug use: No   Family History  Problem Relation Age of Onset  . Heart attack Mother   . Hyperlipidemia  Mother   . CAD Other   . Prostate cancer Neg Hx    Allergies  Allergen Reactions  . Sulfa Antibiotics Rash       Medications: Outpatient Medications Prior to Visit  Medication Sig  . acetaminophen (TYLENOL) 500 MG tablet Take 1,000 mg by mouth every 6 (six) hours as needed.  Marland Kitchen aspirin EC 81 MG tablet Take 81 mg by mouth daily.  . Cholecalciferol (VITAMIN D3) 1000 UNITS CAPS Take 1,000 Units by mouth daily.   Marland Kitchen doxycycline (VIBRAMYCIN) 100 MG capsule Take 1 capsule by mouth 2 (two) times daily. With food  . ELIQUIS 5 MG TABS tablet TAKE 1 TABLET TWICE A DAY  (SWITCHED FROM PLAVIX)  . ENTRESTO 24-26 MG TAKE 1 TABLET TWICE A DAY  . isosorbide mononitrate (IMDUR) 30 MG 24 hr tablet TAKE 1 TABLET DAILY  . metFORMIN (GLUCOPHAGE) 500 MG tablet TAKE 1 TABLET TWICE DAILY  WITH MEALS  . metoprolol succinate (TOPROL-XL) 25 MG 24 hr tablet Take 1 tablet (25 mg total) by mouth at bedtime.  . montelukast (SINGULAIR) 10 MG tablet Take 10 mg by mouth daily as needed (sneezing/allergies.).   Marland Kitchen Multiple Vitamin (MULTIVITAMIN WITH MINERALS) TABS tablet Take 1 tablet by mouth daily. One-A-Day Multivitamin  . omeprazole (PRILOSEC) 40 MG capsule TAKE 1 CAPSULE TWICE DAILY AS NEEDED  . simvastatin (ZOCOR) 40 MG tablet TAKE 1 TABLET AT BEDTIME  . spironolactone (ALDACTONE) 25 MG tablet TAKE 1 TABLET DAILY (DOSE  DECREASE) (Patient taking differently: Take 50 mg by mouth daily. )  . triamcinolone (NASACORT) 55 MCG/ACT AERO nasal inhaler 2 sprays  daily.  Marland Kitchen ezetimibe (ZETIA) 10 MG tablet Take 1 tablet (10 mg total) by mouth daily. (Patient not taking: Reported on 11/08/2020)  . fluticasone (FLONASE) 50 MCG/ACT nasal spray Place 2 sprays into both nostrils daily. (Patient not taking: Reported on 11/08/2020)   No facility-administered medications prior to visit.    Review of Systems  Constitutional: Negative.   Respiratory: Negative.   Cardiovascular: Negative.   Endocrine: Negative.   Musculoskeletal: Negative.   Neurological: Negative.     Last CBC Lab Results  Component Value Date   WBC 6.2 05/08/2020   HGB 12.2 (L) 05/08/2020   HCT 37.5 05/08/2020   MCV 77 (L) 05/08/2020   MCH 25.2 (L) 05/08/2020   RDW 16.6 (H) 05/08/2020   PLT 224 10/16/8526   Last metabolic panel Lab Results  Component Value Date   GLUCOSE 138 (H) 06/05/2020   NA 137 06/05/2020   K 4.6 06/05/2020   CL 106 06/05/2020   CO2 22 06/05/2020   BUN 25 (H) 06/05/2020   CREATININE 1.20 09/27/2020   GFRNONAA 59 (L) 06/05/2020   GFRAA >60 06/05/2020   CALCIUM 9.2 06/05/2020   PROT 6.5 05/08/2020   ALBUMIN 4.4 05/08/2020   LABGLOB 2.1 05/08/2020   AGRATIO 2.1 05/08/2020   BILITOT 0.4 05/08/2020   ALKPHOS 53 05/08/2020   AST 16 05/08/2020   ALT 10 05/08/2020   ANIONGAP 9 06/05/2020   Last lipids Lab Results  Component Value Date   CHOL 134 05/08/2020   HDL 52 05/08/2020   LDLCALC 66 05/08/2020   TRIG 83 05/08/2020   CHOLHDL 3.2 12/10/2019   Last hemoglobin A1c Lab Results  Component Value Date   HGBA1C 7.7 (A) 11/09/2020      Objective    BP 139/77   Pulse 86   Temp 97.8 F (36.6 C)   Resp 16   Wt 190  lb (86.2 kg)   BMI 28.06 kg/m  BP Readings from Last 3 Encounters:  11/09/20 139/77  10/04/20 (!) 164/80  06/21/20 135/73   Wt Readings from Last 3 Encounters:  11/09/20 190 lb (86.2 kg)  10/04/20 190 lb (86.2 kg)  06/21/20 192 lb (87.1 kg)     Physical Exam Constitutional:      General: He is not in acute distress.     Appearance: He is well-developed.  HENT:     Head: Normocephalic and atraumatic.     Right Ear: Hearing normal.     Left Ear: Hearing normal.     Nose: Nose normal.  Eyes:     General: Lids are normal. No scleral icterus.       Right eye: No discharge.        Left eye: No discharge.     Conjunctiva/sclera: Conjunctivae normal.  Cardiovascular:     Rate and Rhythm: Normal rate and regular rhythm.     Pulses: Normal pulses.     Heart sounds: Normal heart sounds.  Pulmonary:     Effort: Pulmonary effort is normal. No respiratory distress.     Breath sounds: Normal breath sounds.  Abdominal:     General: Bowel sounds are normal.     Palpations: Abdomen is soft.  Musculoskeletal:        General: Normal range of motion.  Skin:    Findings: No lesion or rash.     Comments: Bandages on the right ear and right forehead from recent MOHS procedure.  Neurological:     Mental Status: He is alert and oriented to person, place, and time.  Psychiatric:        Speech: Speech normal.        Behavior: Behavior normal.        Thought Content: Thought content normal.     Diabetic Foot Form - Detailed   Diabetic Foot Exam - detailed Diabetic Foot exam was performed with the following findings: Yes 11/09/2020 11:01 AM  Visual Foot Exam completed.: Yes  Can the patient see the bottom of their feet?: Yes Are the shoes appropriate in style and fit?: Yes Is there swelling or and abnormal foot shape?: No Is there a claw toe deformity?: No Is there elevated skin temparature?: No Is there foot or ankle muscle weakness?: No Normal Range of Motion: Yes Pulse Foot Exam completed.: Yes  Right posterior Tibialias: Present Left posterior Tibialias: Present  Right Dorsalis Pedis: Present Left Dorsalis Pedis: Present  Semmes-Weinstein Monofilament Test R Site 1-Great Toe: Pos L Site 1-Great Toe: Neg         Results for orders placed or performed in visit on 11/09/20  POCT glycosylated hemoglobin  (Hb A1C)  Result Value Ref Range   Hemoglobin A1C 7.7 (A) 4.0 - 5.6 %   HbA1c POC (<> result, manual entry)     HbA1c, POC (prediabetic range)     HbA1c, POC (controlled diabetic range)     Est. average glucose Bld gHb Est-mCnc 174     Assessment & Plan     1. Type 2 diabetes mellitus with diabetic neuropathy, unspecified whether long term insulin use (HCC) Peripheral neuropathy still present in the left foot. Hgb A1C 7.7% today. Tolerating Metformin 500 mg BID without side effects. Urine microalbumen 20 today. Recheck routine labs and follow up pending reports. - POCT glycosylated hemoglobin (Hb A1C) - CBC with Differential/Platelet - Comprehensive metabolic panel - Lipid panel  2. Congestive heart failure,  unspecified HF chronicity, unspecified heart failure type ([place]) Tolerating Entresto and no swelling recently. Recheck labs. - CBC with Differential/Platelet - Comprehensive metabolic panel - Lipid panel  3. Pacemaker-St.Jude Replaced last year. History of complete heart block. No chest pains or palpitations recently. Maintain follow up with cardiologist.   No follow-ups on file.      Andres Shad, PA, have reviewed all documentation for this visit. The documentation on 11/09/20 for the exam, diagnosis, procedures, and orders are all accurate and complete.    [name], [place] [phone] (phone) [phone] (fax)  [place]

## 2020-11-10 DIAGNOSIS — J330 Polyp of nasal cavity: Secondary | ICD-10-CM | POA: Diagnosis not present

## 2020-11-13 ENCOUNTER — Encounter: Payer: Medicare Other | Admitting: Physical Therapy

## 2020-11-13 DIAGNOSIS — I509 Heart failure, unspecified: Secondary | ICD-10-CM | POA: Diagnosis not present

## 2020-11-13 DIAGNOSIS — E1140 Type 2 diabetes mellitus with diabetic neuropathy, unspecified: Secondary | ICD-10-CM | POA: Diagnosis not present

## 2020-11-14 ENCOUNTER — Other Ambulatory Visit: Payer: Self-pay

## 2020-11-14 ENCOUNTER — Ambulatory Visit (INDEPENDENT_AMBULATORY_CARE_PROVIDER_SITE_OTHER): Payer: Medicare Other | Admitting: Internal Medicine

## 2020-11-14 ENCOUNTER — Encounter: Payer: Self-pay | Admitting: Internal Medicine

## 2020-11-14 VITALS — BP 128/76 | HR 73 | Ht 69.0 in | Wt 189.0 lb

## 2020-11-14 DIAGNOSIS — I509 Heart failure, unspecified: Secondary | ICD-10-CM | POA: Diagnosis not present

## 2020-11-14 DIAGNOSIS — I701 Atherosclerosis of renal artery: Secondary | ICD-10-CM | POA: Diagnosis not present

## 2020-11-14 DIAGNOSIS — I442 Atrioventricular block, complete: Secondary | ICD-10-CM | POA: Diagnosis not present

## 2020-11-14 DIAGNOSIS — I10 Essential (primary) hypertension: Secondary | ICD-10-CM

## 2020-11-14 DIAGNOSIS — I428 Other cardiomyopathies: Secondary | ICD-10-CM

## 2020-11-14 DIAGNOSIS — Z950 Presence of cardiac pacemaker: Secondary | ICD-10-CM

## 2020-11-14 LAB — CBC WITH DIFFERENTIAL/PLATELET
Basophils Absolute: 0.1 10*3/uL (ref 0.0–0.2)
Basos: 1 %
EOS (ABSOLUTE): 0.4 10*3/uL (ref 0.0–0.4)
Eos: 7 %
Hematocrit: 41.4 % (ref 37.5–51.0)
Hemoglobin: 13.2 g/dL (ref 13.0–17.7)
Immature Grans (Abs): 0 10*3/uL (ref 0.0–0.1)
Immature Granulocytes: 0 %
Lymphocytes Absolute: 2.1 10*3/uL (ref 0.7–3.1)
Lymphs: 36 %
MCH: 25.2 pg — ABNORMAL LOW (ref 26.6–33.0)
MCHC: 31.9 g/dL (ref 31.5–35.7)
MCV: 79 fL (ref 79–97)
Monocytes Absolute: 0.6 10*3/uL (ref 0.1–0.9)
Monocytes: 10 %
Neutrophils Absolute: 2.8 10*3/uL (ref 1.4–7.0)
Neutrophils: 46 %
Platelets: 285 10*3/uL (ref 150–450)
RBC: 5.24 x10E6/uL (ref 4.14–5.80)
RDW: 17 % — ABNORMAL HIGH (ref 11.6–15.4)
WBC: 6 10*3/uL (ref 3.4–10.8)

## 2020-11-14 LAB — COMPREHENSIVE METABOLIC PANEL
ALT: 10 IU/L (ref 0–44)
AST: 16 IU/L (ref 0–40)
Albumin/Globulin Ratio: 1.7 (ref 1.2–2.2)
Albumin: 4.2 g/dL (ref 3.6–4.6)
Alkaline Phosphatase: 54 IU/L (ref 44–121)
BUN/Creatinine Ratio: 19 (ref 10–24)
BUN: 23 mg/dL (ref 8–27)
Bilirubin Total: 0.5 mg/dL (ref 0.0–1.2)
CO2: 19 mmol/L — ABNORMAL LOW (ref 20–29)
Calcium: 9.4 mg/dL (ref 8.6–10.2)
Chloride: 104 mmol/L (ref 96–106)
Creatinine, Ser: 1.24 mg/dL (ref 0.76–1.27)
GFR calc Af Amer: 60 mL/min/{1.73_m2} (ref >59)
GFR calc non Af Amer: 52 mL/min/{1.73_m2} — ABNORMAL LOW (ref >59)
Globulin, Total: 2.5 g/dL (ref 1.5–4.5)
Glucose: 131 mg/dL — ABNORMAL HIGH (ref 65–99)
Potassium: 4.6 mmol/L (ref 3.5–5.2)
Sodium: 139 mmol/L (ref 134–144)
Total Protein: 6.7 g/dL (ref 6.0–8.5)

## 2020-11-14 LAB — LIPID PANEL
Chol/HDL Ratio: 3 ratio (ref 0.0–5.0)
Cholesterol, Total: 130 mg/dL (ref 100–199)
HDL: 43 mg/dL (ref >39)
LDL Chol Calc (NIH): 63 mg/dL (ref 0–99)
Triglycerides: 137 mg/dL (ref 0–149)
VLDL Cholesterol Cal: 24 mg/dL (ref 5–40)

## 2020-11-14 LAB — CUP PACEART INCLINIC DEVICE CHECK
Battery Remaining Longevity: 116 mo
Battery Voltage: 3.01 V
Brady Statistic RA Percent Paced: 1.3 %
Brady Statistic RV Percent Paced: 99.6 %
Date Time Interrogation Session: 20211123113029
Implantable Lead Implant Date: 20111208
Implantable Lead Implant Date: 20111208
Implantable Lead Location: 753859
Implantable Lead Location: 753860
Implantable Lead Model: 1948
Implantable Pulse Generator Implant Date: 20200717
Lead Channel Impedance Value: 400 Ohm
Lead Channel Impedance Value: 437.5 Ohm
Lead Channel Pacing Threshold Amplitude: 0.75 V
Lead Channel Pacing Threshold Amplitude: 0.75 V
Lead Channel Pacing Threshold Amplitude: 1.25 V
Lead Channel Pacing Threshold Pulse Width: 0.5 ms
Lead Channel Pacing Threshold Pulse Width: 0.5 ms
Lead Channel Pacing Threshold Pulse Width: 0.5 ms
Lead Channel Sensing Intrinsic Amplitude: 12 mV
Lead Channel Sensing Intrinsic Amplitude: 4.2 mV
Lead Channel Setting Pacing Amplitude: 1.5 V
Lead Channel Setting Pacing Amplitude: 2 V
Lead Channel Setting Pacing Pulse Width: 0.5 ms
Lead Channel Setting Sensing Sensitivity: 4 mV
Pulse Gen Model: 2272
Pulse Gen Serial Number: 9150507

## 2020-11-14 LAB — PACEMAKER DEVICE OBSERVATION

## 2020-11-14 NOTE — Patient Instructions (Signed)
Medication Instructions:  - Your physician recommends that you continue on your current medications as directed. Please refer to the Current Medication list given to you today.  *If you need a refill on your cardiac medications before your next appointment, please call your pharmacy*   Lab Work: - none ordered  If you have labs (blood work) drawn today and your tests are completely normal, you will receive your results only by: Marland Kitchen MyChart Message (if you have MyChart) OR . A paper copy in the mail If you have any lab test that is abnormal or we need to change your treatment, we will call you to review the results.   Testing/Procedures: - none ordered   Follow-Up: At [facility], you and your health needs are our priority.  As part of our continuing mission to provide you with exceptional heart care, we have created designated Provider Care Teams.  These Care Teams include your primary Cardiologist (physician) and Advanced Practice Providers (APPs -  Physician Assistants and Nurse Practitioners) who all work together to provide you with the care you need, when you need it.  We recommend signing up for the patient portal called "MyChart".  Sign up information is provided on this After Visit Summary.  MyChart is used to connect with patients for Virtual Visits (Telemedicine).  Patients are able to view lab/test results, encounter notes, upcoming appointments, etc.  Non-urgent messages can be sent to your provider as well.   To learn more about what you can do with MyChart, go to [email or web address].    Your next appointment:   1 year(s)  The format for your next appointment:   In Person  Provider:   [name], MD   Other Instructions

## 2020-11-14 NOTE — Progress Notes (Signed)
,      Patient Care Team: Chrismon, [name], PA as PCP - General (Family Medicine) [name], MD as PCP - Cardiology (Cardiology) Deboraha Sprang, MD as PCP - Electrophysiology (Cardiology) Dasher, [name], MD as Consulting Physician (Dermatology) Birder Robson, MD as Referring Physician (Ophthalmology) Lucky Cowboy Erskine Squibb, MD as Referring Physician (Vascular Surgery) Carloyn Manner, MD as Referring Physician (Otolaryngology) Noreene Filbert, MD as Referring Physician (Radiation Oncology) Erby Pian, MD as Referring Physician (Specialist)   HPI  Howard [name] is a 86 y.o. male Seen for pacemaker follow-up for device implanted for complete heart block 12/11.  Underwent generator replacement 7/20    He has nonobstructive coronary disease with a modest nonischemic cardiomyopathy   Hospitalized 9/20 with a splenic infarct; for reasons unclear to me from the notes, it was elected to treat him with anticoagulation.  The discharge summary referred to atrial fibrillation.  I have reviewed device interrogation for the last 4 years and there has been no atrial fibrillation.  The patient denies chest pain, shortness of breath, nocturnal dyspnea, orthopnea or peripheral edema.  There have been no palpitations, lightheadedness or syncope.    Recent skin excisions for skin cancer  DATE TEST EF   8/15 Echo  35-40 %   5/16 Echo  45-50 %   6/19 Echo 35-40%   8/19 MYOVIEW 33%   9/20 TEE 50-55%  PFO   Date Cr K Hgb  9/19 0.85 5.2 14.5   6/20 1.08    9/20 1.31 5.3 11.1  11/21 1.24 4.6 13.2        History of peripheral vascular disease with prior revascularization Records and Results Reviewed  Past Medical History:  Diagnosis Date  . Arthritis   . Bell palsy   . Cancer [facility])    prostate and skin  . Chronic combined systolic and diastolic CHF, NYHA class 1 ([place])    a. 07/2014 Echo: EF 35-40%, Gr 1 DD.  Marland Kitchen Complete heart block ([place])    a. 11/2010 s/p SJM 2210 [employer]  DC PPM, ser# [id number].  . Depression   . Diabetes mellitus without complication ([place])   . Fall 11-10-14  . GERD (gastroesophageal reflux disease)   . History of prostate cancer   . Hyperlipidemia   . Hypertension   . LBBB (left bundle branch block)   . Left-sided Bell's palsy   . Lung cancer ([place]) 2016  . NICM (nonischemic cardiomyopathy) ([place])    a. 07/2014 Echo: EF 35-40%, mid-apicalanteroseptal DK, Gr 1 DD, mild-mod dil LA.  . Non-obstructive CAD    a. 07/2014 Abnl MV;  b. 08/2014 Cath: LM nl, LAD 30p, RI 40p, LCX nl, OM1 40, RCA dominant 30p, 70d-->Med Rx.  Marland Kitchen Poor balance   . Presence of permanent cardiac pacemaker   . Sleep apnea    a. cpap  . Vertigo   . WPW (Wolff-Parkinson-White syndrome)    a. S/P RFCA 1991.    Past Surgical History:  Procedure Laterality Date  . ABDOMINAL AORTIC ENDOVASCULAR STENT GRAFT  08/25/2019   Procedure: ABDOMINAL AORTIC ENDOVASCULAR STENT GRAFT;  Surgeon: Algernon Huxley, MD;  Location: [facility] ORS;  Service: Vascular;;  . ANGIOPLASTY Left 08/25/2019   Procedure: ANGIOPLASTY;  Surgeon: Algernon Huxley, MD;  Location: [facility] ORS;  Service: Vascular;  Laterality: Left;  left SFA and stent placement  . APPLICATION OF WOUND VAC Left 06/07/2015   Procedure: APPLICATION OF WOUND VAC;  Surgeon: Robert Bellow, MD;  Location: [facility] ORS;  Service: General;  Laterality: Left;  left upper back  . BACK SURGERY     2011  . CARDIAC CATHETERIZATION  08/26/2014   Single vessel obstructive CAD  . CARPAL TUNNEL RELEASE  04-04-15   [facility]  . CATARACT EXTRACTION  07-31-11 and 09-18-11  . Catheter ablation  1991   for WPW  . cervical fusion    . CHOLECYSTECTOMY  09-07-14  . ENDARTERECTOMY FEMORAL Left 08/25/2019   Procedure: ENDARTERECTOMY FEMORAL;  Surgeon: Algernon Huxley, MD;  Location: [facility] ORS;  Service: Vascular;  Laterality: Left;  common and produndis   . ENDOVASCULAR REPAIR/STENT GRAFT Right 08/25/2019   Procedure: ENDOVASCULAR REPAIR/STENT GRAFT;  Surgeon: Algernon Huxley, MD;   Location: [facility] ORS;  Service: Vascular;  Laterality: Right;  renal artery  . HAND SURGERY     right 1993; left 2005  . HERNIA REPAIR  1955  . INSERT / REPLACE / REMOVE PACEMAKER    . INSERTION OF ILIAC STENT Bilateral 08/25/2019   Procedure: INSERTION OF ILIAC STENT;  Surgeon: Algernon Huxley, MD;  Location: [facility] ORS;  Service: Vascular;  Laterality: Bilateral;  . JOINT REPLACEMENT Left 2013   knee  . JOINT REPLACEMENT Right 2004   knee  . KNEE SURGERY     left knee 1991 and 1992; right knee 1995  . LEFT HEART CATHETERIZATION WITH CORONARY ANGIOGRAM N/A 08/26/2014   Procedure: LEFT HEART CATHETERIZATION WITH CORONARY ANGIOGRAM;  Surgeon: [name], MD;  Location: [facility] C[facility] LAB;  Service: Cardiovascular;  Laterality: N/A;  . LOWER EXTREMITY ANGIOGRAPHY Left 08/23/2019   Procedure: Lower Extremity Angiography;  Surgeon: Algernon Huxley, MD;  Location: [place] CV LAB;  Service: Cardiovascular;  Laterality: Left;  . LUMBAR LAMINECTOMY/DECOMPRESSION MICRODISCECTOMY N/A 06/07/2014   Procedure: LUMBAR FOUR TO FIVE LUMBAR LAMINECTOMY/DECOMPRESSION MICRODISCECTOMY 1 LEVEL;  Surgeon: [name], MD;  Location: [place] NEURO ORS;  Service: Neurosurgery;  Laterality: N/A;  . LUNG BIOPSY Right 2016   Dr [name]  . MOHS SURGERY    . PACEMAKER INSERTION     PPM-- St Jude 11/30/10 by [name]  . PPM GENERATOR CHANGEOUT N/A 07/09/2019   Procedure: PPM GENERATOR CHANGEOUT;  Surgeon: Deboraha Sprang, MD;  Location: [place] CV LAB;  Service: Cardiovascular;  Laterality: N/A;  . PROSTATE SURGERY     cancer--1998, prostatectomy  . REPLACEMENT TOTAL KNEE     2004  . ruptured disc     1962 and 1998  . TEE WITHOUT CARDIOVERSION N/A 09/01/2019   Procedure: TRANSESOPHAGEAL ECHOCARDIOGRAM (TEE);  Surgeon: [name], MD;  Location: [facility] ORS;  Service: Cardiovascular;  Laterality: N/A;  . TEMPORARY PACEMAKER N/A 07/09/2019   Procedure: TEMPORARY PACEMAKER;  Surgeon: Deboraha Sprang, MD;  Location: [place] CV LAB;   Service: Cardiovascular;  Laterality: N/A;  . TRIGGER FINGER RELEASE  01-24-15  . WOUND DEBRIDEMENT Left 06/07/2015   Procedure: DEBRIDEMENT WOUND;  Surgeon: Robert Bellow, MD;  Location: [facility] ORS;  Service: General;  Laterality: Left;  left upper back    Current Outpatient Medications  Medication Sig Dispense Refill  . acetaminophen (TYLENOL) 500 MG tablet Take 1,000 mg by mouth every 6 (six) hours as needed.    Marland Kitchen aspirin EC 81 MG tablet Take 81 mg by mouth daily.    . Cholecalciferol (VITAMIN D3) 1000 UNITS CAPS Take 1,000 Units by mouth daily.     Marland Kitchen doxycycline (VIBRAMYCIN) 100 MG capsule Take 1 capsule by mouth 2 (two)  times daily. With food    . ELIQUIS 5 MG TABS tablet TAKE 1 TABLET TWICE A DAY  (SWITCHED FROM PLAVIX) 60 tablet 11  . ENTRESTO 24-26 MG TAKE 1 TABLET TWICE A DAY 90 tablet 0  . isosorbide mononitrate (IMDUR) 30 MG 24 hr tablet TAKE 1 TABLET DAILY 90 tablet 2  . metFORMIN (GLUCOPHAGE) 500 MG tablet TAKE 1 TABLET TWICE DAILY  WITH MEALS 180 tablet 0  . metoprolol succinate (TOPROL-XL) 25 MG 24 hr tablet Take 1 tablet (25 mg total) by mouth at bedtime. 90 tablet 3  . montelukast (SINGULAIR) 10 MG tablet Take 10 mg by mouth daily as needed (sneezing/allergies.).     Marland Kitchen Multiple Vitamin (MULTIVITAMIN WITH MINERALS) TABS tablet Take 1 tablet by mouth daily. One-A-Day Multivitamin    . omeprazole (PRILOSEC) 40 MG capsule TAKE 1 CAPSULE TWICE DAILY AS NEEDED 180 capsule 3  . simvastatin (ZOCOR) 40 MG tablet TAKE 1 TABLET AT BEDTIME 90 tablet 3  . spironolactone (ALDACTONE) 25 MG tablet TAKE 1 TABLET DAILY (DOSE  DECREASE) (Patient taking differently: Take 50 mg by mouth daily. ) 90 tablet 1  . triamcinolone (NASACORT) 55 MCG/ACT AERO nasal inhaler 2 sprays daily.     No current facility-administered medications for this visit.    Allergies  Allergen Reactions  . Sulfa Antibiotics Rash      Review of Systems negative except from HPI and PMH  Physical Exam BP 128/76    Pulse 73   Ht 5\' 9"  (1.753 m)   Wt 189 lb (85.7 kg)   BMI 27.91 kg/m   Well developed and well nourished in no acute distress HENT normal Neck supple with JVP-flat Clear Device pocket well healed; without hematoma or erythema.  There is no tethering  Regular rate and rhythm, no gallop No murmur Abd-soft with active BS No Clubbing cyanosis   edema Skin-warm and dry A & Oriented  Grossly normal sensory and motor function  ECG Sinus @ 73 P-synchronous/ AV  pacing    Assessment and  Plan Nonischemic cardiomyopathy  Complete heart block  Pacemaker-Saint Jude    Congestive heart failure class 3  Hypertension  Splenic infarct     Euvolemic continue current meds  On Anticoagulation;  No bleeding issues   Not sure I understand the indication for anticoagulation with splenic infarct.  The differential diagnosis is outlined and up-to-date is rather broad and includes hypercoagulable disorders, atherosclerosis in addition to atrial fibrillation and inflammatory conditions.  Looking briefly at the chart from 9/20 many of these things are not clearly outlined.  Up-to-date goes on to say also and it may well be "prudent "to use anticoagulation people for whom cryptogenic infarction is the diagnosis.  We will defer further evaluation to Dr. Deidre Ala and his primary doctors.

## 2020-11-23 ENCOUNTER — Encounter: Payer: Medicare Other | Admitting: Physical Therapy

## 2020-11-26 ENCOUNTER — Other Ambulatory Visit: Payer: Self-pay | Admitting: Cardiovascular Disease

## 2020-11-26 ENCOUNTER — Other Ambulatory Visit: Payer: Self-pay | Admitting: Internal Medicine

## 2020-11-30 ENCOUNTER — Encounter: Payer: Medicare Other | Admitting: Physical Therapy

## 2020-12-07 ENCOUNTER — Encounter: Payer: Medicare Other | Admitting: Physical Therapy

## 2020-12-08 DIAGNOSIS — E119 Type 2 diabetes mellitus without complications: Secondary | ICD-10-CM | POA: Diagnosis not present

## 2020-12-08 LAB — HM DIABETES EYE EXAM

## 2020-12-17 NOTE — Progress Notes (Signed)
Cardiology Office Note  Date:  12/19/2020   ID:  Howard [name], DOB 28-Dec-1930, MRN [id number]  PCP:  Margo Common, PA-C   Chief Complaint  Patient presents with  . Other    10 month f/u c/o coughing up phelgm/sneezing spells. Meds reviewed verbally with pt.    HPI:  Mr. Veal is a 86 year old-year-old gentleman with a hx of  DM II complete heart block, status post pacemaker in 2011,  nonischemic cardiomyopathy,  ejection fraction 30-35%   coronary artery disease, 70% distal RCA disease, 30 and 40% lesions in his ramus, LAD  admission to the hospital 09/05/2014  with acute cholecystitis, ileus.  Cardiac catheterization at that time for positive stress test . Lung CA  He presents for follow-up of his CAD  Discussed events over the past year Chronic sinus drainage, coughing sputum Sometimes feels a cupful of sputum Followed by ENT and pulmonary with Dr. Raul Del Has CT scan of lungs pending Prior CT scan lungs 2 months ago no acute findings, emphysema noted  SOB on exertion Sedentary, presents in wheelchair  Echo 07/2019: EF 55 to 60%  EKG personally reviewed by myself on todays visit Paced rhythm rate 68 bpm  Other past medical history reviewed hospital August 30, 2019 with abdominal pain, CT scan confirming splenic infarct, right renal artery malperfusion TEE with no evidence of left atrial thrombus or left atrial appendage thrombus Severe aortic atherosclerosis,  New pacer 06/2019 Pacer downloads reviewed: Stable  Sept 2020, vascular records reviewed with him in detail AAA, right renal artery stenosis, severe aortoiliac occlusive disease with left iliac occlusion and right iliac stenosis, left SFA stenosis, rest pain and pregangrenous changes to the left great toe 1. Left common femoral artery and profunda femoris artery endarterectomy with core matrix patch angioplasty 2. Placement of a 6 mm diameter by 15 cm length via bond stent left superficial femoral  artery postdilated with a 5 mm diameter Lutonix drug-coated angioplasty balloon 3 Right renal artery stent placement with 7 mm diameter by 16 mm length lifestream stent---Placement of a 23 mm diameter 14 cm length Gore Excluder Endoprosthesis main body right with a 14 mm diameter by 12 cm length left contralateral limb ---Stent placement in the right external iliac artery with 10 mm diameter by 58 mm length lifestream stent for occlusive disease --Gore excluder limb extension with a 12 mm diameter by 14 cm length limb to the distal right external iliac artery for occlusive disease --Viabahn stent placement to the left external iliac artery with 13 mm diameter by 10 cm length stent for occlusive disease ---Fogarty embolectomy left SFA  TEE 08/2019 reviewed in detail . The left ventricle has low normal systolic function, with an ejection  fraction of 50-55%. The cavity size was normal. There is mildly increased  left ventricular wall thickness. Left ventricular diastolic Doppler  parameters are consistent with  indeterminate diastolic dysfunction. Indeterminate filling pressures No  evidence of left ventricular regional wall motion abnormalities.  2. The right ventricle has normal systolc function. There is no increase  in right ventricular wall thickness.  3. Left atrial size was moderately dilated.  4. No evidence of a thrombus present in the left atrium or left atrial  appendage.  5. Evidence of atrial level shunting detected by color flow Doppler, PFO  present, saline contrast bubble study positive for PFO, small number of  bubbles crossing at rest, unable to test with valsalva  6. Moderate plaque in the descending aorta and aortic  arch.   Limited by history of back pain, back surgery x6   Other past medical history reviewed hospital November 2016 for abscess on his back, had surgery Nodules found in the right lung, biopsy documenting adenocarcinoma 1.3 cm, treated with  radiation  Previous CT chest 03/2016 Decreasing size of pathology-proven adenocarcinoma of right upper lobe, with associated radiation treatment effects of the lung.   PMH:   has a past medical history of Arthritis, Bell palsy, Cancer ([place]), Chronic combined systolic and diastolic CHF, NYHA class 1 ([place]), Complete heart block ([place]), Depression, Diabetes mellitus without complication ([place]), Fall (11-10-14), GERD (gastroesophageal reflux disease), History of prostate cancer, Hyperlipidemia, Hypertension, LBBB (left bundle branch block), Left-sided Bell's palsy, Lung cancer ([place]) (2016), NICM (nonischemic cardiomyopathy) ([place]), Non-obstructive CAD, Poor balance, Presence of permanent cardiac pacemaker, Sleep apnea, Vertigo, and WPW (Wolff-Parkinson-White syndrome).  PSH:    Past Surgical History:  Procedure Laterality Date  . ABDOMINAL AORTIC ENDOVASCULAR STENT GRAFT  08/25/2019   Procedure: ABDOMINAL AORTIC ENDOVASCULAR STENT GRAFT;  Surgeon: Algernon Huxley, MD;  Location: [facility] ORS;  Service: Vascular;;  . ANGIOPLASTY Left 08/25/2019   Procedure: ANGIOPLASTY;  Surgeon: Algernon Huxley, MD;  Location: [facility] ORS;  Service: Vascular;  Laterality: Left;  left SFA and stent placement  . APPLICATION OF WOUND VAC Left 06/07/2015   Procedure: APPLICATION OF WOUND VAC;  Surgeon: Robert Bellow, MD;  Location: [facility] ORS;  Service: General;  Laterality: Left;  left upper back  . BACK SURGERY     2011  . CARDIAC CATHETERIZATION  08/26/2014   Single vessel obstructive CAD  . CARPAL TUNNEL RELEASE  04-04-15   [facility]  . CATARACT EXTRACTION  07-31-11 and 09-18-11  . Catheter ablation  1991   for WPW  . cervical fusion    . CHOLECYSTECTOMY  09-07-14  . ENDARTERECTOMY FEMORAL Left 08/25/2019   Procedure: ENDARTERECTOMY FEMORAL;  Surgeon: Algernon Huxley, MD;  Location: [facility] ORS;  Service: Vascular;  Laterality: Left;  common and produndis   . ENDOVASCULAR REPAIR/STENT GRAFT Right 08/25/2019   Procedure: ENDOVASCULAR REPAIR/STENT  GRAFT;  Surgeon: Algernon Huxley, MD;  Location: [facility] ORS;  Service: Vascular;  Laterality: Right;  renal artery  . HAND SURGERY     right 1993; left 2005  . HERNIA REPAIR  1955  . INSERT / REPLACE / REMOVE PACEMAKER    . INSERTION OF ILIAC STENT Bilateral 08/25/2019   Procedure: INSERTION OF ILIAC STENT;  Surgeon: Algernon Huxley, MD;  Location: [facility] ORS;  Service: Vascular;  Laterality: Bilateral;  . JOINT REPLACEMENT Left 2013   knee  . JOINT REPLACEMENT Right 2004   knee  . KNEE SURGERY     left knee 1991 and 1992; right knee 1995  . LEFT HEART CATHETERIZATION WITH CORONARY ANGIOGRAM N/A 08/26/2014   Procedure: LEFT HEART CATHETERIZATION WITH CORONARY ANGIOGRAM;  Surgeon: [name], MD;  Location[facility] CATH LAB;  Service: Cardiovascular;  Laterality: N/A;  . LOWER EXTREMITY ANGIOGRAPHY Left 08/23/2019   Procedure: Lower Extremity Angiography;  Surgeon: Algernon Huxley, MD;  Location: [place] CV LAB;  Service: Cardiovascular;  Laterality: Left;  . LUMBAR LAMINECTOMY/DECOMPRESSION MICRODISCECTOMY N/A 06/07/2014   Procedure: LUMBAR FOUR TO FIVE LUMBAR LAMINECTOMY/DECOMPRESSION MICRODISCECTOMY 1 LEVEL;  Surgeon: [name], MD;  Location: [place] NEURO ORS;  Service: Neurosurgery;  Laterality: N/A;  . LUNG BIOPSY Right 2016   Dr [name]  . MOHS SURGERY    . PACEMAKER INSERTION     PPM--  St Jude 11/30/10 by [name]  . PPM GENERATOR CHANGEOUT N/A 07/09/2019   Procedure: PPM GENERATOR CHANGEOUT;  Surgeon: Deboraha Sprang, MD;  Location: [place] CV LAB;  Service: Cardiovascular;  Laterality: N/A;  . PROSTATE SURGERY     cancer--1998, prostatectomy  . REPLACEMENT TOTAL KNEE     2004  . ruptured disc     1962 and 1998  . TEE WITHOUT CARDIOVERSION N/A 09/01/2019   Procedure: TRANSESOPHAGEAL ECHOCARDIOGRAM (TEE);  Surgeon: [name], MD;  Location: [facility] ORS;  Service: Cardiovascular;  Laterality: N/A;  . TEMPORARY PACEMAKER N/A 07/09/2019   Procedure: TEMPORARY PACEMAKER;  Surgeon: Deboraha Sprang,  MD;  Location: [place] CV LAB;  Service: Cardiovascular;  Laterality: N/A;  . TRIGGER FINGER RELEASE  01-24-15  . WOUND DEBRIDEMENT Left 06/07/2015   Procedure: DEBRIDEMENT WOUND;  Surgeon: Robert Bellow, MD;  Location: [facility] ORS;  Service: General;  Laterality: Left;  left upper back    Current Outpatient Medications  Medication Sig Dispense Refill  . acetaminophen (TYLENOL) 500 MG tablet Take 1,000 mg by mouth every 6 (six) hours as needed.    Marland Kitchen aspirin EC 81 MG tablet Take 81 mg by mouth daily.    . Cholecalciferol (VITAMIN D3) 1000 UNITS CAPS Take 1,000 Units by mouth daily.    Marland Kitchen ELIQUIS 5 MG TABS tablet TAKE 1 TABLET TWICE A DAY  (SWITCHED FROM PLAVIX) 60 tablet 11  . ENTRESTO 24-26 MG TAKE 1 TABLET TWICE A DAY 90 tablet 3  . ezetimibe (ZETIA) 10 MG tablet Take 10 mg by mouth daily.    Marland Kitchen FLOVENT HFA 110 MCG/ACT inhaler every 12 (twelve) hours as needed.    . isosorbide mononitrate (IMDUR) 30 MG 24 hr tablet TAKE 1 TABLET DAILY 90 tablet 2  . metFORMIN (GLUCOPHAGE) 500 MG tablet TAKE 1 TABLET TWICE DAILY  WITH MEALS 180 tablet 0  . metoprolol succinate (TOPROL-XL) 25 MG 24 hr tablet TAKE 1 TABLET AT BEDTIME 90 tablet 0  . montelukast (SINGULAIR) 10 MG tablet Take 10 mg by mouth daily as needed (sneezing/allergies.).     Marland Kitchen Multiple Vitamin (MULTIVITAMIN WITH MINERALS) TABS tablet Take 1 tablet by mouth daily. One-A-Day Multivitamin    . omeprazole (PRILOSEC) 40 MG capsule TAKE 1 CAPSULE TWICE DAILY AS NEEDED 180 capsule 3  . simvastatin (ZOCOR) 40 MG tablet TAKE 1 TABLET AT BEDTIME 90 tablet 3  . spironolactone (ALDACTONE) 25 MG tablet TAKE 1 TABLET DAILY (DOSE  DECREASE) (Patient taking differently: Take 50 mg by mouth daily.) 90 tablet 1  . triamcinolone (NASACORT) 55 MCG/ACT AERO nasal inhaler 2 sprays daily.     No current facility-administered medications for this visit.     Allergies:   Sulfa antibiotics   Social History:  The patient  reports that he has quit smoking. He  quit after 4.00 years of use. He has never used smokeless tobacco. He reports that he does not drink alcohol and does not use drugs.   Family History:   family history includes CAD in an other family member; Heart attack in his mother; Hyperlipidemia in his mother.    Review of Systems: Review of Systems  Constitutional: Negative.   HENT: Negative.   Respiratory: Positive for cough and sputum production.   Cardiovascular: Negative.   Gastrointestinal: Negative.   Musculoskeletal: Positive for back pain and joint pain.  Neurological: Negative.   Psychiatric/Behavioral: Negative.   All other systems reviewed and are negative.    PHYSICAL EXAM:  VS:  BP 120/62 (BP Location: Left Arm, Patient Position: Sitting, Cuff Size: Normal)   Pulse 68   Ht 5' 9.5" (1.765 m)   Wt 192 lb (87.1 kg)   SpO2 97%   BMI 27.95 kg/m  , BMI Body mass index is 27.95 kg/m. Constitutional:  oriented to person, place, and time. No distress.  HENT:  Head: Grossly normal Eyes:  no discharge. No scleral icterus.  Neck: No JVD, no carotid bruits  Cardiovascular: Regular rate and rhythm, no murmurs appreciated Pulmonary/Chest: Clear to auscultation bilaterally, no wheezes or rails Abdominal: Soft.  no distension.  no tenderness.  Musculoskeletal: Normal range of motion Neurological:  normal muscle tone. Coordination normal. No atrophy Skin: Skin warm and dry Psychiatric: normal affect, pleasant   Recent Labs: 05/08/2020: TSH 1.890 11/13/2020: ALT 10; BUN 23; Creatinine, Ser 1.24; Hemoglobin 13.2; Platelets 285; Potassium 4.6; Sodium 139    Lipid Panel Lab Results  Component Value Date   CHOL 130 11/13/2020   HDL 43 11/13/2020   LDLCALC 63 11/13/2020   TRIG 137 11/13/2020      Wt Readings from Last 3 Encounters:  12/19/20 192 lb (87.1 kg)  11/14/20 189 lb (85.7 kg)  11/09/20 190 lb (86.2 kg)      ASSESSMENT AND PLAN:  Coronary artery disease involving native coronary artery of native  heart without angina pectoris -  Currently with no symptoms of angina. No further workup at this time. Continue current medication regimen.  Complete heart block (HCC) - Plan: EKG 12-Lead S/p pacemaker Ejection fraction 55% No events on tele  Essential hypertension - Plan: EKG 12-Lead Blood pressure is well controlled on today's visit. No changes made to the medications.  Mixed hyperlipidemia  On simvastatin Zetia 10 mg daily  Nonischemic cardiomyopathy (HCC) Ef 55% stable  Chronic back pain In a wheelchair  Pacemaker-St.Jude Followed by EP  COPD/cough  recommend follow-up with Dr. Raul Del, we have called in prescription for albuterol Unable to exclude bronchiectasis given chronic sputum production Has repeat CT scan chest pending  Malignant neoplasm of middle lobe of right lung [facility]) Previous CT scan March 2017  repeat scan through Dr. Dereck Leep   Total encounter time more than 25 minutes  Greater than 50% was spent in counseling and coordination of care with the patient    No orders of the defined types were placed in this encounter.    Signed, Esmond Plants, M.D., Ph.D. 12/19/2020  [place], [place]

## 2020-12-19 ENCOUNTER — Other Ambulatory Visit: Payer: Self-pay

## 2020-12-19 ENCOUNTER — Ambulatory Visit (INDEPENDENT_AMBULATORY_CARE_PROVIDER_SITE_OTHER): Payer: Medicare Other | Admitting: Cardiovascular Disease

## 2020-12-19 ENCOUNTER — Encounter: Payer: Self-pay | Admitting: Cardiovascular Disease

## 2020-12-19 VITALS — BP 120/62 | HR 68 | Ht 69.5 in | Wt 192.0 lb

## 2020-12-19 DIAGNOSIS — I428 Other cardiomyopathies: Secondary | ICD-10-CM

## 2020-12-19 DIAGNOSIS — I10 Essential (primary) hypertension: Secondary | ICD-10-CM | POA: Diagnosis not present

## 2020-12-19 DIAGNOSIS — Z950 Presence of cardiac pacemaker: Secondary | ICD-10-CM

## 2020-12-19 DIAGNOSIS — I7090 Unspecified atherosclerosis: Secondary | ICD-10-CM

## 2020-12-19 DIAGNOSIS — R600 Localized edema: Secondary | ICD-10-CM | POA: Diagnosis not present

## 2020-12-19 DIAGNOSIS — I442 Atrioventricular block, complete: Secondary | ICD-10-CM

## 2020-12-19 DIAGNOSIS — I701 Atherosclerosis of renal artery: Secondary | ICD-10-CM | POA: Diagnosis not present

## 2020-12-19 DIAGNOSIS — I509 Heart failure, unspecified: Secondary | ICD-10-CM | POA: Diagnosis not present

## 2020-12-19 MED ORDER — ALBUTEROL SULFATE HFA 108 (90 BASE) MCG/ACT IN AERS: 2.0000 | INHALATION_SPRAY | Freq: Four times a day (QID) | RESPIRATORY_TRACT | 2 refills | Status: DC | PRN

## 2020-12-19 NOTE — Patient Instructions (Addendum)
Reach out to Dr. Raul Del  (pulmonologist) at [facility] if you have bronchiectasis  Medication Instructions:  No changes  If you need a refill on your cardiac medications before your next appointment, please call your pharmacy.    Lab work: No new labs needed   If you have labs (blood work) drawn today and your tests are completely normal, you will receive your results only by:  [place] (if you have MyChart) OR  A paper copy in the mail If you have any lab test that is abnormal or we need to change your treatment, we will call you to review the results.   Testing/Procedures: No new testing needed   Follow-Up: At [facility], you and your health needs are our priority.  As part of our continuing mission to provide you with exceptional heart care, we have created designated Provider Care Teams.  These Care Teams include your primary Cardiologist (physician) and Advanced Practice Providers (APPs -  Physician Assistants and Nurse Practitioners) who all work together to provide you with the care you need, when you need it.   You will need a follow up appointment in 12 months   Providers on your designated Care Team:    Murray Hodgkins, NP  Christell Faith, PA-C  Marrianne Mood, PA-C  Any Other Special Instructions Will Be Listed Below (If Applicable).  COVID-19 Vaccine Information can be found at: [email or web address] For questions related to vaccine distribution or appointments, please email [email or web address] or call [phone].

## 2020-12-28 ENCOUNTER — Telehealth: Payer: Self-pay

## 2020-12-28 NOTE — Telephone Encounter (Signed)
Pt wife returning missed call. Scheduled consult w/ Dr. Patsey Berthold for 01/01/21 at 1:30.//TM

## 2020-12-28 NOTE — Telephone Encounter (Addendum)
Lm to offer consult with Dr. Patsey Berthold on 01/01/2021 at 1:30, as instructed by [place] via epic secure message. Okay per Dr. Patsey Berthold to add.

## 2020-12-30 ENCOUNTER — Other Ambulatory Visit: Payer: Self-pay | Admitting: Family Medicine

## 2020-12-30 DIAGNOSIS — E119 Type 2 diabetes mellitus without complications: Secondary | ICD-10-CM

## 2021-01-01 ENCOUNTER — Other Ambulatory Visit: Payer: Self-pay

## 2021-01-01 ENCOUNTER — Encounter: Payer: Self-pay | Admitting: Pulmonary Disease

## 2021-01-01 ENCOUNTER — Ambulatory Visit (INDEPENDENT_AMBULATORY_CARE_PROVIDER_SITE_OTHER): Payer: Medicare Other | Admitting: Pulmonary Disease

## 2021-01-01 VITALS — BP 124/76 | HR 76 | Ht 69.5 in | Wt 190.7 lb

## 2021-01-01 DIAGNOSIS — J449 Chronic obstructive pulmonary disease, unspecified: Secondary | ICD-10-CM

## 2021-01-01 DIAGNOSIS — Z85118 Personal history of other malignant neoplasm of bronchus and lung: Secondary | ICD-10-CM

## 2021-01-01 DIAGNOSIS — G4733 Obstructive sleep apnea (adult) (pediatric): Secondary | ICD-10-CM | POA: Diagnosis not present

## 2021-01-01 DIAGNOSIS — R0600 Dyspnea, unspecified: Secondary | ICD-10-CM

## 2021-01-01 DIAGNOSIS — Z9989 Dependence on other enabling machines and devices: Secondary | ICD-10-CM | POA: Diagnosis not present

## 2021-01-01 MED ORDER — TRELEGY ELLIPTA 100-62.5-25 MCG/INH IN AEPB: 1.0000 | INHALATION_SPRAY | Freq: Every day | RESPIRATORY_TRACT | 6 refills | Status: DC

## 2021-01-01 NOTE — Progress Notes (Signed)
Subjective:    Patient ID: Howard [name], male    DOB: [birth year], 87 y.o.   MRN: [id number]  HPI Patient is an 87 year old former smoker (quit 2011, 56 pack years) with a very complex history of nonischemic cardiomyopathy, complete heart block status post pacer, moderate COPD and obstructive sleep apnea who presents for evaluation of the same.  The patient is a self-referral.  His primary care practitioner is [facility].  The patient presents today with a complaint that he has not had CPAP available months due to a recall on his CPAP unit.  On further investigation it appears that the CPAP unit is due for replacement however there is a significant wait time as there are many units that have to be replaced by the manufacturer.  The patient also complains of dyspnea on exertion he is unsure of his current inhaler medications and appears somewhat befuddled about this.  He did not bring his medications with him.  That he should be on Flovent which he does not seem to be using.  It seems like he is using albuterol only as needed.  He cannot tell me when was the last time he used it.  He has been previously followed by Dr. Wallene Huh.  He was concerned that his CPAP machine was not being replaced quickly but I assured him that this was not an issue with Dr. Raul Del but rather with the manufacturer of the machine.  Dr. Raul Del placed the order for replacement in November 2021.  It appears that there is a backlog of machines being replaced.  The wife notes that he has been more restless since he cannot use his CPAP.  He has not had any orthopnea, paroxysmal nocturnal dyspnea or lower extremity edema.  He has nonischemic cardiomyopathy and follows closely with cardiology.  He also has a history of complete heart block and is status post pacer placement.  Approximately 3 to 4 months ago he had cough with streaky hemoptysis.  He has not had recurrence of this issue.  He has had a history of  adenocarcinoma of the lung and by most recent imaging it does not appear that he has any recurrence.  Most recent PET/CT was equivocal but by CT criteria had no changes noted.  The patient is an [place] he has not had any recent travel outside the country.  No other history elicited today.  Prior PFTs showed him to be in the moderate range of COPD.   Review of Systems A 10 point review of systems was performed and it is as noted above otherwise negative.  Past Medical History:  Diagnosis Date   Arthritis    Bell palsy    Cancer [facility])    prostate and skin   Chronic combined systolic and diastolic CHF, NYHA class 1 ([place])    a. 07/2014 Echo: EF 35-40%, Gr 1 DD.   Complete heart block ([place])    a. 11/2010 s/p SJM 2210 [employer] DC PPM, ser# [id number].   Depression    Diabetes mellitus without complication ([place])    Fall 11-10-14   GERD (gastroesophageal reflux disease)    History of prostate cancer    Hyperlipidemia    Hypertension    LBBB (left bundle branch block)    Left-sided Bell's palsy    Lung cancer ([place]) 2016   NICM (nonischemic cardiomyopathy) ([place])    a. 07/2014 Echo: EF 35-40%, mid-apicalanteroseptal DK, Gr 1 DD, mild-mod dil LA.   Non-obstructive CAD  a. 07/2014 Abnl MV;  b. 08/2014 Cath: LM nl, LAD 30p, RI 40p, LCX nl, OM1 40, RCA dominant 30p, 70d-->Med Rx.   Poor balance    Presence of permanent cardiac pacemaker    Sleep apnea    a. cpap   Vertigo    WPW (Wolff-Parkinson-White syndrome)    a. S/P RFCA 1991.   Past Surgical History:  Procedure Laterality Date   ABDOMINAL AORTIC ENDOVASCULAR STENT GRAFT  08/25/2019   Procedure: ABDOMINAL AORTIC ENDOVASCULAR STENT GRAFT;  Surgeon: Algernon Huxley, MD;  Location: [facility] ORS;  Service: Vascular;;   ANGIOPLASTY Left 08/25/2019   Procedure: ANGIOPLASTY;  Surgeon: Algernon Huxley, MD;  Location: [facility] ORS;  Service: Vascular;  Laterality: Left;  left SFA and stent placement   APPLICATION OF WOUND VAC Left  06/07/2015   Procedure: APPLICATION OF WOUND VAC;  Surgeon: Robert Bellow, MD;  Location: [facility] ORS;  Service: General;  Laterality: Left;  left upper back   [place]  08/26/2014   Single vessel obstructive CAD   CARPAL TUNNEL RELEASE  04-04-15   [facility]   CATARACT EXTRACTION  07-31-11 and 09-18-11   Catheter ablation  1991   for WPW   cervical fusion     CHOLECYSTECTOMY  09-07-14   ENDARTERECTOMY FEMORAL Left 08/25/2019   Procedure: ENDARTERECTOMY FEMORAL;  Surgeon: Algernon Huxley, MD;  Location: [facility] ORS;  Service: Vascular;  Laterality: Left;  common and produndis    ENDOVASCULAR REPAIR/STENT GRAFT Right 08/25/2019   Procedure: ENDOVASCULAR REPAIR/STENT GRAFT;  Surgeon: Algernon Huxley, MD;  Location: [facility] ORS;  Service: Vascular;  Laterality: Right;  renal artery   HAND SURGERY     right 1993; left 2005   [place] / REPLACE / REMOVE PACEMAKER     INSERTION OF ILIAC STENT Bilateral 08/25/2019   Procedure: INSERTION OF ILIAC STENT;  Surgeon: Algernon Huxley, MD;  Location: [facility] ORS;  Service: Vascular;  Laterality: Bilateral;   JOINT REPLACEMENT Left 2013   knee   JOINT REPLACEMENT Right 2004   knee   KNEE SURGERY     left knee 1991 and 1992; right knee [place] N/A 08/26/2014   Procedure: LEFT HEART CATHETERIZATION WITH CORONARY ANGIOGRAM;  Surgeon: [name], MD;  Location: [facility] CATH LAB;  Service: Cardiovascular;  Laterality: N/A;   LOWER EXTREMITY ANGIOGRAPHY Left 08/23/2019   Procedure: Lower Extremity Angiography;  Surgeon: Algernon Huxley, MD;  Location: [place] CV LAB;  Service: Cardiovascular;  Laterality: Left;   LUMBAR LAMINECTOMY/DECOMPRESSION MICRODISCECTOMY N/A 06/07/2014   Procedure: LUMBAR FOUR TO FIVE LUMBAR LAMINECTOMY/DECOMPRESSION MICRODISCECTOMY 1 LEVEL;  Surgeon: [name], MD;  Location: [place] NEURO ORS;  Service: Neurosurgery;  Laterality: N/A;    LUNG BIOPSY Right 2016   Dr [name]   MOHS SURGERY     PACEMAKER INSERTION     PPM-- St Jude 11/30/10 by [name]   PPM GENERATOR CHANGEOUT N/A 07/09/2019   Procedure: PPM GENERATOR CHANGEOUT;  Surgeon: Deboraha Sprang, MD;  Location: [place] CV LAB;  Service: Cardiovascular;  Laterality: N/A;   PROSTATE SURGERY     cancer--1998, prostatectomy   REPLACEMENT TOTAL KNEE     2004   ruptured disc     1962 and 1998   TEE WITHOUT CARDIOVERSION N/A 09/01/2019   Procedure: TRANSESOPHAGEAL ECHOCARDIOGRAM (TEE);  Surgeon: [name], MD;  Location: [facility] OR[facility];  Service: Cardiovascular;  Laterality: N/A;   TEMPORARY PACEMAKER N/A 07/09/2019   Procedure: TEMPORARY PACEMAKER;  Surgeon: Deboraha Sprang, MD;  Location: [place] CV LAB;  Service: Cardiovascular;  Laterality: N/A;   TRIGGER FINGER RELEASE  01-24-15   WOUND DEBRIDEMENT Left 06/07/2015   Procedure: DEBRIDEMENT WOUND;  Surgeon: Robert Bellow, MD;  Location: [facility] ORS;  Service: General;  Laterality: Left;  left upper back   Patient Active Problem List   Diagnosis Date Noted   AAA (abdominal aortic aneurysm) without rupture ([place]) 12/10/2019   Renal artery stenosis ([place]) 12/10/2019   PAD (peripheral artery disease) ([place]) 12/10/2019   Thromboembolism ([place]) 08/30/2019   Atherosclerosis of native arteries of extremity with intermittent claudication ([place]) 08/23/2019   Swelling of limb 08/17/2019   Pain in limb 08/17/2019   NICM (nonischemic cardiomyopathy) ([place]) 07/01/2019   CHF (congestive heart failure) ([place]) 07/01/2019   Malignant neoplasm of right lung ([place]) 04/18/2016   CAD (coronary artery disease) 04/01/2016   Adenocarcinoma ([place]) 04/01/2016   Skin cyst 12/26/2015   GERD (gastroesophageal reflux disease) 06/16/2015   Abscess of back 06/06/2015   Vertigo 03/27/2015   Carpal tunnel syndrome 10/28/2014   Status post cholecystectomy 09/29/2014   Disease of digestive tract 09/29/2014   Cardiomyopathy  ([place]) 09/26/2014   Type 2 diabetes mellitus without complications ([place])    Sleep apnea    Spinal stenosis, lumbar region, with neurogenic claudication 06/07/2014   Lumbar stenosis with neurogenic claudication 06/07/2014   Abnormal gait 08/20/2012   H/O total knee replacement 08/20/2012   Arthritis of knee, degenerative 08/20/2012   Pacemaker-St.Jude 08/03/2012   Cardiac conduction disorder 06/19/2012   Acid reflux 06/18/2012   Nodal rhythm disorder 06/18/2012   Triggering of digit 03/25/2012   Hyperlipidemia 12/23/2011   Essential hypertension 03/23/2011   Atrioventricular block, complete ([place]) 03/23/2011   Complete atrioventricular block ([place]) 03/23/2011   Social History   Tobacco Use   Smoking status: Former Smoker    Packs/day: 1.00    Years: 56.00    Pack years: 56.00    Types: Cigarettes    Quit date: 2011    Years since quitting: 11.0   Smokeless tobacco: Never Used  Substance Use Topics   Alcohol use: No   Allergies  Allergen Reactions   Sulfa Antibiotics Rash   Current Meds  Medication Sig   acetaminophen (TYLENOL) 500 MG tablet Take 1,000 mg by mouth every 6 (six) hours as needed.   albuterol (VENTOLIN HFA) 108 (90 Base) MCG/ACT inhaler Inhale 2 puffs into the lungs every 6 (six) hours as needed for wheezing or shortness of breath.   aspirin EC 81 MG tablet Take 81 mg by mouth daily.   cetirizine (ZYRTEC) 10 MG tablet Take 10 mg by mouth daily.   Cholecalciferol (VITAMIN D3) 1000 UNITS CAPS Take 1,000 Units by mouth daily.   ELIQUIS 5 MG TABS tablet TAKE 1 TABLET TWICE A DAY  (SWITCHED FROM PLAVIX)   ENTRESTO 24-26 MG TAKE 1 TABLET TWICE A DAY   ezetimibe (ZETIA) 10 MG tablet Take 10 mg by mouth daily.   FLOVENT HFA 110 MCG/ACT inhaler every 12 (twelve) hours as needed.   Fluticasone-Umeclidin-Vilant (TRELEGY ELLIPTA) 100-62.5-25 MCG/INH AEPB Inhale 1 puff into the lungs daily.   isosorbide mononitrate (IMDUR) 30 MG 24 hr tablet  TAKE 1 TABLET DAILY   metFORMIN (GLUCOPHAGE) 500 MG tablet TAKE 1 TABLET TWICE DAILY  WITH MEALS   metoprolol succinate (TOPROL-XL) 25 MG  24 hr tablet TAKE 1 TABLET AT BEDTIME   Multiple Vitamin (MULTIVITAMIN WITH MINERALS) TABS tablet Take 1 tablet by mouth daily. One-A-Day Multivitamin   omeprazole (PRILOSEC) 40 MG capsule TAKE 1 CAPSULE TWICE DAILY AS NEEDED   simvastatin (ZOCOR) 40 MG tablet TAKE 1 TABLET AT BEDTIME   spironolactone (ALDACTONE) 25 MG tablet TAKE 1 TABLET DAILY (DOSE  DECREASE) (Patient taking differently: Take 50 mg by mouth daily.)   triamcinolone (NASACORT) 55 MCG/ACT AERO nasal inhaler 2 sprays daily.  **Patient is not sure of which inhalers he is using.  It does not appear he is actually using Flovent.  Appears he is using albuterol.  Did not bring medications with him to the visit.  Immunization History  Administered Date(s) Administered   Influenza, High Dose Seasonal PF 09/15/2015, 09/30/2017, 10/02/2018, 09/05/2020   Influenza, Quadrivalent, Recombinant, Inj, Pf 10/12/2019   Influenza-Unspecified 08/23/2013, 09/15/2015, 09/12/2016   PFIZER(Purple Top)SARS-COV-2 Vaccination 12/30/2019, 01/20/2020, 10/05/2020   Pneumococcal Conjugate-13 04/18/2016   Pneumococcal Polysaccharide-23 09/30/2017   Tdap 09/07/2011   Zoster 11/22/2013   Zoster Recombinat (Shingrix) 08/28/2018, 11/04/2018      Objective:   Physical Exam BP 124/76 (BP Location: Left Arm, Cuff Size: Normal)    Pulse 76    Ht 5' 9.5" (1.765 m)    Wt 190 lb 11.2 oz (86.5 kg)    SpO2 95%    BMI 27.76 kg/m  GENERAL: Well-developed well-nourished elderly gentleman in no acute distress.  No tachypnea, speech is fluent.  No conversational dyspnea.  Presents in transport chair. HEAD: Normocephalic, atraumatic.  EYES: Pupils equal, round, reactive to light.  No scleral icterus.  MOUTH: Nose/mouth/throat not examined due to masking requirements for COVID 19. NECK: Supple. No thyromegaly. Trachea  midline. No JVD.  No adenopathy. PULMONARY: Good air entry bilaterally.  Coarse breath sounds with no other adventitious sounds. CARDIOVASCULAR: S1 and S2. Regular rate and rhythm.  Pacer on left chest.  No rubs, murmurs or gallops heard. ABDOMEN: Benign. MUSCULOSKELETAL: No joint deformity, no clubbing, no edema.  NEUROLOGIC: Appears befuddled at times, no overt focal deficit, speech is fluent. SKIN: Intact,warm,dry.  On limited exam, no rashes. PSYCH: Mood and behavior normal.  Representative image from chest CT obtained 27 September 2020:  PET/CT inconclusive.,  Continue to follow.  No significant change of the nodular density noted.     Assessment & Plan:     ICD-10-CM   1. COPD, moderate ([place])  J44.9 Pulse oximetry, overnight   By recent PFTs noted to be moderate Trial of Trelegy Ellipta Overnight oximetry rule out nocturnal hypoxemia  2. Dyspnea, unspecified type  R06.00 Pulse oximetry, overnight   May be due to poorly compensated COPD Additionally does have issues with non ischemic cardiomyopathy  3. OSA on CPAP  G47.33    Z99.89    CPAP unit on recall Requires AutoSet 5 to 20 cm pressure Patient awaiting replacement unit  4. History of adenocarcinoma of lung  Z85.118    Being followed by radiation oncology Not a candidate for invasive procedures Most recent PET/CT indeterminate for recurrence   Orders Placed This Encounter  Procedures   Pulse oximetry, overnight    On roomair.  NLG:[id number]    Standing Status:   Future    Standing Expiration Date:   01/01/2022   Meds ordered this encounter  Medications   Fluticasone-Umeclidin-Vilant (TRELEGY ELLIPTA) 100-62.5-25 MCG/INH AEPB    Sig: Inhale 1 puff into the lungs daily.    Dispense:  60 each  Refill:  6   Discussion:  Patient appears to have great confusion with his respiratory medications.  We will try to simplify by providing him Trelegy Ellipta 100/62.5/25, 1 inhalation daily.He is not to use fluticasone  (Flovent) while on the Trelegy.  He may continue to use his albuterol as needed.  Have moderate COPD and does not appear to be well compensated in this regard.  We will also obtain overnight oximetry to exclude potential nocturnal hypoxemia.  Does not appear to have hypoxemia during the daytime.  We will obtain PFTs at a time.  Currently his main complaint is that of not having his CPAP machine available.  His DME company is [employer].  Machine was a [employer], he is still waiting on replacement machine other alternatives would be to prescribe a different brand altogether.  However insurance may not cover this.  We will see the patient in follow-up in 2 months time, he is to contact us prior to that time should any new difficulties arise.   Renold Don, MD [facility]   *This note was dictated using voice recognition software/Dragon.  Despite best efforts to proofread, errors can occur which can change the meaning.  Any change was purely unintentional.

## 2021-01-01 NOTE — Patient Instructions (Signed)
We have switch her maintenance inhaler to Trelegy Ellipta 100/62.5/25, 1 inhalation daily, make sure that you rinse your mouth well after you use it.  Let us know if there is any problems with the coverage for your inhaler.  We are going to get an oxygen level at nighttime to make sure you are not having low oxygen levels during your sleep.   We will see you in follow-up in 2 months time call sooner should any new problems arise.

## 2021-01-05 ENCOUNTER — Ambulatory Visit (INDEPENDENT_AMBULATORY_CARE_PROVIDER_SITE_OTHER): Payer: Medicare Other

## 2021-01-05 DIAGNOSIS — I428 Other cardiomyopathies: Secondary | ICD-10-CM

## 2021-01-08 LAB — CUP PACEART REMOTE DEVICE CHECK
Battery Remaining Longevity: 112 mo
Battery Remaining Percentage: 95.5 %
Battery Voltage: 3.01 V
Brady Statistic AP VP Percent: 1 %
Brady Statistic AP VS Percent: 1 %
Brady Statistic AS VP Percent: 99 %
Brady Statistic AS VS Percent: 1 %
Brady Statistic RA Percent Paced: 1 %
Brady Statistic RV Percent Paced: 99 %
Date Time Interrogation Session: 20220114020014
Implantable Lead Implant Date: 20111208
Implantable Lead Implant Date: 20111208
Implantable Lead Location: 753859
Implantable Lead Location: 753860
Implantable Lead Model: 1948
Implantable Pulse Generator Implant Date: 20200717
Lead Channel Impedance Value: 380 Ohm
Lead Channel Impedance Value: 440 Ohm
Lead Channel Pacing Threshold Amplitude: 0.75 V
Lead Channel Pacing Threshold Amplitude: 1.25 V
Lead Channel Pacing Threshold Pulse Width: 0.5 ms
Lead Channel Pacing Threshold Pulse Width: 0.5 ms
Lead Channel Sensing Intrinsic Amplitude: 12 mV
Lead Channel Sensing Intrinsic Amplitude: 4 mV
Lead Channel Setting Pacing Amplitude: 1.5 V
Lead Channel Setting Pacing Amplitude: 2 V
Lead Channel Setting Pacing Pulse Width: 0.5 ms
Lead Channel Setting Sensing Sensitivity: 4 mV
Pulse Gen Model: 2272
Pulse Gen Serial Number: 9150507

## 2021-01-12 ENCOUNTER — Encounter: Payer: Self-pay | Admitting: Family Medicine

## 2021-01-12 ENCOUNTER — Encounter: Payer: Self-pay | Admitting: Pulmonary Disease

## 2021-01-12 ENCOUNTER — Telehealth: Payer: Self-pay | Admitting: Pulmonary Disease

## 2021-01-12 NOTE — Telephone Encounter (Signed)
Spoke to patient's spouse, [name](DPR). [name] stated that patient is experiencing confusion and memory loss. Sx have worsened over the past 2mo. [name] is concerned that this may be related to low oxygen levels.  Pt has appt with PCP on 01/15/2021. Sob is baseline. [facility] has not contacted patient to schedule ONO. Order was placed on 01/01/2021.  Dr. Patsey Berthold, please advise on oxygen.  Rodena Piety, can you help with ONO?

## 2021-01-12 NOTE — Telephone Encounter (Signed)
He does not exhibit hypoxemia during the daytime.  Though low oxygen levels at nighttime may aggravate inattentiveness they usually do not leave to confusion and memory loss as he is having.  He may need to be evaluated by neurology.  I would discuss this with his PCP.  We will double check with regards to the ONO.

## 2021-01-12 NOTE — Telephone Encounter (Signed)
Howard [name] is aware of below recommendations.  She voiced her understanding and had no further questions.

## 2021-01-12 NOTE — Telephone Encounter (Signed)
I have spoke with Marita Kansas with [employer] and she states that they have left messages for the patient to call and schedule.  Marita Kansas is going to try and call Howard [name] now

## 2021-01-15 ENCOUNTER — Encounter: Payer: Self-pay | Admitting: Family Medicine

## 2021-01-15 ENCOUNTER — Other Ambulatory Visit: Payer: Self-pay

## 2021-01-15 ENCOUNTER — Ambulatory Visit (INDEPENDENT_AMBULATORY_CARE_PROVIDER_SITE_OTHER): Payer: Medicare Other | Admitting: Family Medicine

## 2021-01-15 VITALS — BP 126/62 | HR 63 | Resp 16 | Wt 192.2 lb

## 2021-01-15 DIAGNOSIS — G4733 Obstructive sleep apnea (adult) (pediatric): Secondary | ICD-10-CM

## 2021-01-15 DIAGNOSIS — R413 Other amnesia: Secondary | ICD-10-CM

## 2021-01-15 NOTE — Progress Notes (Signed)
Established patient visit   Patient: Howard [name]   DOB: [birth year]   87 y.o. Male  MRN: [id number] Visit Date: 01/15/2021  Today's healthcare provider: [name], PA-C   Chief Complaint  Patient presents with  . Memory problems   Subjective    HPI  Memory problem: Patients wife reports a decline in patients cognitive abilities. She  states that patient has been having trouble with remembering passwords or why he did certain things. 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    Past Medical History:  Diagnosis Date  . Arthritis   . Bell palsy   . Cancer [facility])    prostate and skin  . Chronic combined systolic and diastolic CHF, NYHA class 1 ([place])    a. 07/2014 Echo: EF 35-40%, Gr 1 DD.  Marland Kitchen Complete heart block ([place])    a. 11/2010 s/p SJM 2210 [employer] DC PPM, ser# [id number].  . Depression   . Diabetes mellitus without complication ([place])   . Fall 11-10-14  . GERD (gastroesophageal reflux disease)   . History of prostate cancer   . Hyperlipidemia   . Hypertension   . LBBB (left bundle branch block)   . Left-sided Bell's palsy   . Lung cancer ([place]) 2016  . NICM (nonischemic cardiomyopathy) ([place])    a. 07/2014 Echo: EF 35-40%, mid-apicalanteroseptal DK, Gr 1 DD, mild-mod dil LA.  . Non-obstructive CAD    a. 07/2014 Abnl MV;  b. 08/2014 Cath: LM nl, LAD 30p, RI 40p, LCX nl, OM1 40, RCA dominant 30p, 70d-->Med Rx.  Marland Kitchen Poor balance   . Presence of permanent cardiac pacemaker   . Sleep apnea    a. cpap  . Vertigo   . WPW (Wolff-Parkinson-White syndrome)    a. S/P RFCA 1991.   Past Surgical History:  Procedure Laterality Date  . ABDOMINAL AORTIC ENDOVASCULAR STENT GRAFT  08/25/2019   Procedure: ABDOMINAL AORTIC ENDOVASCULAR STENT GRAFT;  Surgeon: Algernon Huxley, MD;  Location: [facility] ORS;  Service: Vascular;;  . ANGIOPLASTY Left 08/25/2019   Procedure: ANGIOPLASTY;  Surgeon: Algernon Huxley, MD;  Location: [facility] ORS;  Service: Vascular;  Laterality:  Left;  left SFA and stent placement  . APPLICATION OF WOUND VAC Left 06/07/2015   Procedure: APPLICATION OF WOUND VAC;  Surgeon: Robert Bellow, MD;  Location: [facility] ORS;  Service: General;  Laterality: Left;  left upper back  . BACK SURGERY     2011  . CARDIAC CATHETERIZATION  08/26/2014   Single vessel obstructive CAD  . CARPAL TUNNEL RELEASE  04-04-15   [facility]  . CATARACT EXTRACTION  07-31-11 and 09-18-11  . Catheter ablation  1991   for WPW  . cervical fusion    . CHOLECYSTECTOMY  09-07-14  . ENDARTERECTOMY FEMORAL Left 08/25/2019   Procedure: ENDARTERECTOMY FEMORAL;  Surgeon: Algernon Huxley, MD;  Location: [facility] ORS;  Service: Vascular;  Laterality: Left;  common and produndis   . ENDOVASCULAR REPAIR/STENT GRAFT Right 08/25/2019   Procedure: ENDOVASCULAR REPAIR/STENT GRAFT;  Surgeon: Algernon Huxley, MD;  Location: [facility] ORS;  Service: Vascular;  Laterality: Right;  renal artery  . HAND SURGERY     right 1993; left 2005  . HERNIA REPAIR  1955  . INSERT / REPLACE / REMOVE PACEMAKER    . INSERTION OF ILIAC STENT Bilateral 08/25/2019   Procedure: INSERTION OF ILIAC STENT;  Surgeon: Algernon Huxley, MD;  Location: [facility] ORS;  Service: Vascular;  Laterality:  Bilateral;  . JOINT REPLACEMENT Left 2013   knee  . JOINT REPLACEMENT Right 2004   knee  . KNEE SURGERY     left knee 1991 and 1992; right knee 1995  . LEFT HEART CATHETERIZATION WITH CORONARY ANGIOGRAM N/A 08/26/2014   Procedure: LEFT HEART CATHETERIZATION WITH CORONARY ANGIOGRAM;  Surgeon: [name], MD;  Location: [facility] CATH LAB;  Service: Cardiovascular;  Laterality: N/A;  . LOWER EXTREMITY ANGIOGRAPHY Left 08/23/2019   Procedure: Lower Extremity Angiography;  Surgeon: Algernon Huxley, MD;  Location: [place] CV LAB;  Service: Cardiovascular;  Laterality: Left;  . LUMBAR LAMINECTOMY/DECOMPRESSION MICRODISCECTOMY N/A 06/07/2014   Procedure: LUMBAR FOUR TO FIVE LUMBAR LAMINECTOMY/DECOMPRESSION MICRODISCECTOMY 1 LEVEL;  Surgeon: [name], MD;   Location: [place] NEURO ORS;  Service: Neurosurgery;  Laterality: N/A;  . LUNG BIOPSY Right 2016   Dr [name]  . MOHS SURGERY    . PACEMAKER INSERTION     PPM-- St Jude 11/30/10 by [name]  . PPM GENERATOR CHANGEOUT N/A 07/09/2019   Procedure: PPM GENERATOR CHANGEOUT;  Surgeon: Deboraha Sprang, MD;  Location: [place] CV LAB;  Service: Cardiovascular;  Laterality: N/A;  . PROSTATE SURGERY     cancer--1998, prostatectomy  . REPLACEMENT TOTAL KNEE     2004  . ruptured disc     1962 and 1998  . TEE WITHOUT CARDIOVERSION N/A 09/01/2019   Procedure: TRANSESOPHAGEAL ECHOCARDIOGRAM (TEE);  Surgeon: [name], MD;  Location: [facility] ORS;  Service: Cardiovascular;  Laterality: N/A;  . TEMPORARY PACEMAKER N/A 07/09/2019   Procedure: TEMPORARY PACEMAKER;  Surgeon: Deboraha Sprang, MD;  Location: [place] CV LAB;  Service: Cardiovascular;  Laterality: N/A;  . TRIGGER FINGER RELEASE  01-24-15  . WOUND DEBRIDEMENT Left 06/07/2015   Procedure: DEBRIDEMENT WOUND;  Surgeon: Robert Bellow, MD;  Location: [facility] ORS;  Service: General;  Laterality: Left;  left upper back   Social History   Tobacco Use  . Smoking status: Former Smoker    Packs/day: 1.00    Years: 56.00    Pack years: 56.00    Types: Cigarettes    Quit date: 2011    Years since quitting: 11.0  . Smokeless tobacco: Never Used  Vaping Use  . Vaping Use: Never used  Substance Use Topics  . Alcohol use: No  . Drug use: No   Family History  Problem Relation Age of Onset  . Heart attack Mother   . Hyperlipidemia Mother   . CAD Other   . Prostate cancer Neg Hx    Allergies  Allergen Reactions  . Sulfa Antibiotics Rash       Medications: Outpatient Medications Prior to Visit  Medication Sig  . acetaminophen (TYLENOL) 500 MG tablet Take 1,000 mg by mouth every 6 (six) hours as needed.  Marland Kitchen albuterol (VENTOLIN HFA) 108 (90 Base) MCG/ACT inhaler Inhale 2 puffs into the lungs every 6 (six) hours as needed for wheezing or shortness of  breath.  Marland Kitchen aspirin EC 81 MG tablet Take 81 mg by mouth daily.  . cetirizine (ZYRTEC) 10 MG tablet Take 10 mg by mouth daily.  . Cholecalciferol (VITAMIN D3) 1000 UNITS CAPS Take 1,000 Units by mouth daily.  Marland Kitchen ELIQUIS 5 MG TABS tablet TAKE 1 TABLET TWICE A DAY  (SWITCHED FROM PLAVIX)  . ENTRESTO 24-26 MG TAKE 1 TABLET TWICE A DAY  . ezetimibe (ZETIA) 10 MG tablet Take 10 mg by mouth daily.  Marland Kitchen FLOVENT HFA 110 MCG/ACT inhaler every 12 (  twelve) hours as needed.  . Fluticasone-Umeclidin-Vilant (TRELEGY ELLIPTA) 100-62.5-25 MCG/INH AEPB Inhale 1 puff into the lungs daily.  . isosorbide mononitrate (IMDUR) 30 MG 24 hr tablet TAKE 1 TABLET DAILY  . metFORMIN (GLUCOPHAGE) 500 MG tablet TAKE 1 TABLET TWICE DAILY  WITH MEALS  . metoprolol succinate (TOPROL-XL) 25 MG 24 hr tablet TAKE 1 TABLET AT BEDTIME  . montelukast (SINGULAIR) 10 MG tablet Take 10 mg by mouth daily as needed (sneezing/allergies.).  Marland Kitchen Multiple Vitamin (MULTIVITAMIN WITH MINERALS) TABS tablet Take 1 tablet by mouth daily. One-A-Day Multivitamin  . omeprazole (PRILOSEC) 40 MG capsule TAKE 1 CAPSULE TWICE DAILY AS NEEDED  . simvastatin (ZOCOR) 40 MG tablet TAKE 1 TABLET AT BEDTIME  . spironolactone (ALDACTONE) 25 MG tablet TAKE 1 TABLET DAILY (DOSE  DECREASE) (Patient taking differently: Take 50 mg by mouth daily.)  . triamcinolone (NASACORT) 55 MCG/ACT AERO nasal inhaler 2 sprays daily.   No facility-administered medications prior to visit.    Review of Systems  Constitutional: Negative for appetite change, chills and fever.  Respiratory: Negative for chest tightness, shortness of breath and wheezing.   Cardiovascular: Negative for chest pain and palpitations.  Gastrointestinal: Negative for abdominal pain, nausea and vomiting.  Psychiatric/Behavioral:       Memory problems       Objective    BP 126/62   Pulse 63   Resp 16   Wt 192 lb 3.2 oz (87.2 kg)   BMI 27.98 kg/m      MMSE - Mini Mental State Exam 01/15/2021   Orientation to time 4  Orientation to Place 5  Registration 3  Attention/ Calculation 1  Recall 1  Language- name 2 objects 2  Language- repeat 1  Language- follow 3 step command 3  Language- read & follow direction 1  Write a sentence 0  Copy design 1  Total score 22    Physical Exam Constitutional:      General: He is not in acute distress.    Appearance: He is well-developed and well-nourished.  HENT:     Head: Normocephalic and atraumatic.     Right Ear: Hearing normal.     Left Ear: Hearing normal.     Nose: Nose normal.  Eyes:     General: Lids are normal. No scleral icterus.       Right eye: No discharge.        Left eye: No discharge.     Conjunctiva/sclera: Conjunctivae normal.  Cardiovascular:     Rate and Rhythm: Regular rhythm.     Heart sounds: Normal heart sounds.  Pulmonary:     Effort: Pulmonary effort is normal. No respiratory distress.  Abdominal:     General: Bowel sounds are normal.     Palpations: Abdomen is soft.  Musculoskeletal:        General: Normal range of motion.  Skin:    General: Skin is intact.     Findings: No lesion or rash.  Neurological:     Mental Status: He is alert.     Comments: Memory declining.  Psychiatric:        Mood and Affect: Mood and affect normal.        Speech: Speech normal.        Behavior: Behavior normal.        Thought Content: Thought content normal.       No results found for any visits on 01/15/21.  Assessment & Plan     1. Memory loss  Family noticing more memory issues over the past several weeks. Diabetes and circulatory issues stable. Will schedule neurology referral and continue follow up with cardiologist and vascular specialist. MMSE score 22/30.  - Ambulatory referral to Neurology  2. Obstructive sleep apnea syndrome Recall of CPAP machine and pulmonologist has new testing planned.   No follow-ups on file.         [name], PA-C  [employer] [phone]  (phone) [phone] (fax)  [place]

## 2021-01-18 DIAGNOSIS — J449 Chronic obstructive pulmonary disease, unspecified: Secondary | ICD-10-CM | POA: Diagnosis not present

## 2021-01-19 ENCOUNTER — Telehealth: Payer: Self-pay

## 2021-01-19 DIAGNOSIS — J449 Chronic obstructive pulmonary disease, unspecified: Secondary | ICD-10-CM

## 2021-01-19 NOTE — Telephone Encounter (Signed)
ONO has been reviewed by Dr. Patsey Berthold- recommend 2L QHS. Lowest spo2 79%.   Lm for patient.

## 2021-01-19 NOTE — Telephone Encounter (Signed)
Patient's spouse, [name](DPR) is aware of results and voiced her understanding.  Patient is agreeable with oxygen.  Order has been placed to [place].  Nothing further needed.

## 2021-01-20 NOTE — Progress Notes (Signed)
Remote pacemaker transmission.   

## 2021-01-22 ENCOUNTER — Telehealth: Payer: Self-pay | Admitting: Pulmonary Disease

## 2021-01-22 NOTE — Telephone Encounter (Signed)
Spoke to Howard [name] with [facility] stated that she did not see an issue with the current order, however she will discuss this with Gilda.  Estill Bamberg will call back if the order needs to be changed. Nothing further needed at this time.

## 2021-01-23 DIAGNOSIS — J330 Polyp of nasal cavity: Secondary | ICD-10-CM | POA: Diagnosis not present

## 2021-01-23 DIAGNOSIS — H6123 Impacted cerumen, bilateral: Secondary | ICD-10-CM | POA: Diagnosis not present

## 2021-01-23 NOTE — Telephone Encounter (Signed)
ONO has been reviewed by Dr. Patsey Berthold- recommend 2L QHS. Lowest spo2 79%. The 02 order has been placed

## 2021-02-10 ENCOUNTER — Other Ambulatory Visit: Payer: Self-pay | Admitting: Cardiovascular Disease

## 2021-02-10 ENCOUNTER — Telehealth: Payer: Self-pay | Admitting: Family Medicine

## 2021-02-10 DIAGNOSIS — K2100 Gastro-esophageal reflux disease with esophagitis, without bleeding: Secondary | ICD-10-CM

## 2021-02-10 DIAGNOSIS — E119 Type 2 diabetes mellitus without complications: Secondary | ICD-10-CM

## 2021-02-10 NOTE — Telephone Encounter (Signed)
Requested medication (s) are due for refill today: Yes  Requested medication (s) are on the active medication list: Yes  Last refill:  01/29/20  Future visit scheduled: Yes  Notes to clinic:  Unable to refill per protocol, Rx expired.      Requested Prescriptions  Pending Prescriptions Disp Refills   omeprazole (PRILOSEC) 40 MG capsule [Pharmacy Med Name: OMEPRAZOL RX CAP 40MG] 180 capsule 3    Sig: TAKE 1 CAPSULE TWICE DAILY AS NEEDED      Gastroenterology: Proton Pump Inhibitors Passed - 02/10/2021 11:18 AM      Passed - Valid encounter within last 12 months    Recent Outpatient Visits           3 weeks ago Memory loss   [place], PA-C   3 months ago Type 2 diabetes mellitus with diabetic neuropathy, unspecified whether long term insulin use ([place])   [facility], [name], PA-C   7 months ago Shortness of breath   [facility] Birdie Sons, MD   9 months ago Abnormal gait due to peripheral sensory disorder   [facility], [name], PA-C   1 year ago Type 2 diabetes mellitus without complication, without long-term current use of insulin ([place])   [facility], [name], PA-C       Future Appointments             In 1 month Tyler Pita, MD [facility]              Refused Prescriptions Disp Refills   metFORMIN (GLUCOPHAGE) 500 MG tablet [Pharmacy Med Name: METFORMIN TAB 500MG] 180 tablet 0    Sig: TAKE 1 TABLET TWICE DAILY  WITH MEALS      Endocrinology:  Diabetes - Biguanides Failed - 02/10/2021 11:18 AM      Failed - eGFR in normal range and within 360 days    GFR, Est African American  Date Value Ref Range Status  09/30/2017 95 > OR = 60 mL/min/1.31m Final   GFR calc Af Amer  Date Value Ref Range Status  11/13/2020 60 >59 mL/min/1.73 Final    Comment:    **In accordance with recommendations from the NKF-ASN  Task force,**   [facility] is in the process of updating its eGFR calculation to the   2021 CKD-EPI creatinine equation that estimates kidney function   without a race variable.    GFR, Est Non African American  Date Value Ref Range Status  09/30/2017 82 > OR = 60 mL/min/1.730mFinal   GFR calc non Af Amer  Date Value Ref Range Status  11/13/2020 52 (L) >59 mL/min/1.73 Final   GFR  Date Value Ref Range Status  08/24/2014 80.80 >60.00 mL/min Final          Passed - Cr in normal range and within 360 days    Creat  Date Value Ref Range Status  09/30/2017 0.81 0.70 - 1.11 mg/dL Final    Comment:    For patients >4959ears of age, the reference limit for Creatinine is approximately 13% higher for people identified as African-American. .    Creatinine, Ser  Date Value Ref Range Status  11/13/2020 1.24 0.76 - 1.27 mg/dL Final   Creatinine, POC  Date Value Ref Range Status  09/30/2017 NA mg/dL Final          Passed - HBA1C is between 0 and 7.9 and within  180 days    Hemoglobin A1C  Date Value Ref Range Status  11/09/2020 7.7 (A) 4.0 - 5.6 % Final   Hgb A1c MFr Bld  Date Value Ref Range Status  05/08/2020 7.4 (H) 4.8 - 5.6 % Final    Comment:             Prediabetes: 5.7 - 6.4          Diabetes: >6.4          Glycemic control for adults with diabetes: <7.0           Passed - Valid encounter within last 6 months    Recent Outpatient Visits           3 weeks ago Memory loss   [facility], [name], PA-C   3 months ago Type 2 diabetes mellitus with diabetic neuropathy, unspecified whether long term insulin use ([place])   [facility], [name], PA-C   7 months ago Shortness of breath   [facility] Birdie Sons, MD   9 months ago Abnormal gait due to peripheral sensory disorder   [facility], [name], PA-C   1 year ago Type 2 diabetes mellitus without complication, without  long-term current use of insulin [facility])   [place], [name], PA-C       Future Appointments             In 1 month Tyler Pita, MD [place]

## 2021-02-14 MED ORDER — TRELEGY ELLIPTA 100-62.5-25 MCG/INH IN AEPB: 1.0000 | INHALATION_SPRAY | Freq: Every day | RESPIRATORY_TRACT | 3 refills | Status: AC

## 2021-02-16 MED ORDER — METFORMIN HCL 500 MG PO TABS: 500.0000 mg | ORAL_TABLET | Freq: Two times a day (BID) | ORAL | 1 refills | Status: DC

## 2021-02-16 NOTE — Addendum Note (Signed)
Addended by: Ashley Royalty E on: 02/16/2021 02:27 PM   Modules accepted: Orders

## 2021-02-16 NOTE — Telephone Encounter (Signed)
Pts wife called stating that the pt has a few pills left in his medicine holders, but that it is not much. She states that the pt only has about 2 weeks left. Please advise.

## 2021-02-23 ENCOUNTER — Ambulatory Visit (INDEPENDENT_AMBULATORY_CARE_PROVIDER_SITE_OTHER): Payer: Medicare Other | Admitting: Physician Assistant

## 2021-02-23 ENCOUNTER — Other Ambulatory Visit: Payer: Self-pay

## 2021-02-23 ENCOUNTER — Ambulatory Visit
Admission: RE | Admit: 2021-02-23 | Discharge: 2021-02-23 | Disposition: A | Discharge status: Home / Self Care | Payer: Medicare Other | Source: Ambulatory Visit | Attending: Physician Assistant | Admitting: Physician Assistant

## 2021-02-23 ENCOUNTER — Encounter: Payer: Self-pay | Admitting: Physician Assistant

## 2021-02-23 ENCOUNTER — Ambulatory Visit: Payer: Self-pay | Admitting: *Deleted

## 2021-02-23 VITALS — BP 111/93 | HR 74 | Temp 97.6°F | Wt 193.8 lb

## 2021-02-23 DIAGNOSIS — R911 Solitary pulmonary nodule: Secondary | ICD-10-CM | POA: Diagnosis not present

## 2021-02-23 DIAGNOSIS — R079 Chest pain, unspecified: Secondary | ICD-10-CM | POA: Diagnosis not present

## 2021-02-23 DIAGNOSIS — M79602 Pain in left arm: Secondary | ICD-10-CM | POA: Diagnosis not present

## 2021-02-23 DIAGNOSIS — M25512 Pain in left shoulder: Secondary | ICD-10-CM | POA: Diagnosis not present

## 2021-02-23 MED ORDER — HYDROCODONE-ACETAMINOPHEN 5-325 MG PO TABS: 1.0000 | ORAL_TABLET | Freq: Three times a day (TID) | ORAL | 0 refills | Status: AC

## 2021-02-23 NOTE — Progress Notes (Signed)
Established patient visit   Patient: Howard [name]   DOB: 04-Aug-1931   87 y.o. Male  MRN: [id number] Visit Date: 02/23/2021  Today's healthcare provider: Trinna Post, PA-C   Chief Complaint  Patient presents with  . Fall  I,Porsha C McClurkin,acting as a Education administrator for [employer], PA-C.,have documented all relevant documentation on the behalf of Trinna Post, PA-C,as directed by  Trinna Post, PA-C while in the presence of Trinna Post, PA-C.  Subjective    Fall The accident occurred 3 to 5 days ago. The fall occurred while walking. He landed on concrete. There was no blood loss. The point of impact was the left shoulder, left elbow and left wrist (left upper chest). The pain is present in the left upper arm, left shoulder, left elbow and left lower arm (left upper chest). The pain is at a severity of 7/10. The pain is moderate. The symptoms are aggravated by movement, extension, rotation, flexion, standing and use of injured limb. Pertinent negatives include no hearing loss, loss of consciousness, numbness or tingling. He has tried acetaminophen for the symptoms. The treatment provided mild relief.    [place] several days ago when walking out of a restaurant. Fell onto concrete and hit his left side. Did not hit his head or lose consciousness. Now having left sided chest pain especially when he takes a deep breath or coughs. Concerned because this is the location of his port. Patient denies fevers, chills or SOB.      Medications: Outpatient Medications Prior to Visit  Medication Sig  . acetaminophen (TYLENOL) 500 MG tablet Take 1,000 mg by mouth every 6 (six) hours as needed.  Marland Kitchen albuterol (VENTOLIN HFA) 108 (90 Base) MCG/ACT inhaler Inhale 2 puffs into the lungs every 6 (six) hours as needed for wheezing or shortness of breath.  Marland Kitchen aspirin EC 81 MG tablet Take 81 mg by mouth daily.  . cetirizine (ZYRTEC) 10 MG tablet Take 10 mg by mouth daily.  . Cholecalciferol  (VITAMIN D3) 1000 UNITS CAPS Take 1,000 Units by mouth daily.  Marland Kitchen ELIQUIS 5 MG TABS tablet TAKE 1 TABLET TWICE A DAY  (SWITCHED FROM PLAVIX)  . ENTRESTO 24-26 MG TAKE 1 TABLET TWICE A DAY  . ezetimibe (ZETIA) 10 MG tablet TAKE 1 TABLET DAILY  . FLOVENT HFA 110 MCG/ACT inhaler every 12 (twelve) hours as needed.  . Fluticasone-Umeclidin-Vilant (TRELEGY ELLIPTA) 100-62.5-25 MCG/INH AEPB Inhale 1 puff into the lungs daily.  . isosorbide mononitrate (IMDUR) 30 MG 24 hr tablet TAKE 1 TABLET DAILY  . metFORMIN (GLUCOPHAGE) 500 MG tablet Take 1 tablet (500 mg total) by mouth 2 (two) times daily with a meal.  . metoprolol succinate (TOPROL-XL) 25 MG 24 hr tablet TAKE 1 TABLET AT BEDTIME  . montelukast (SINGULAIR) 10 MG tablet Take 10 mg by mouth daily as needed (sneezing/allergies.).  Marland Kitchen Multiple Vitamin (MULTIVITAMIN WITH MINERALS) TABS tablet Take 1 tablet by mouth daily. One-A-Day Multivitamin  . omeprazole (PRILOSEC) 40 MG capsule TAKE 1 CAPSULE TWICE DAILY AS NEEDED  . simvastatin (ZOCOR) 40 MG tablet TAKE 1 TABLET AT BEDTIME  . spironolactone (ALDACTONE) 25 MG tablet TAKE 1 TABLET DAILY (DOSE  DECREASE) (Patient taking differently: Take 50 mg by mouth daily.)  . triamcinolone (NASACORT) 55 MCG/ACT AERO nasal inhaler 2 sprays daily.   No facility-administered medications prior to visit.    Review of Systems  Constitutional: Negative.   Respiratory: Negative.   Cardiovascular: Negative.  Neurological: Negative for tingling, loss of consciousness and numbness.       Objective    BP (!) 111/93 (BP Location: Right Arm, Patient Position: Sitting, Cuff Size: Normal)   Pulse 74   Temp 97.6 F (36.4 C) (Oral)   Wt 193 lb 12.8 oz (87.9 kg)   SpO2 96%   BMI 28.21 kg/m     Physical Exam Constitutional:      Appearance: Normal appearance.  Cardiovascular:     Rate and Rhythm: Normal rate and regular rhythm.     Heart sounds: Normal heart sounds.  Pulmonary:     Effort: Pulmonary effort  is normal.     Breath sounds: Normal breath sounds.  Chest:     Chest wall: Tenderness present.  Skin:    General: Skin is warm and dry.  Neurological:     General: No focal deficit present.     Mental Status: He is alert and oriented to person, place, and time.  Psychiatric:        Mood and Affect: Mood normal.        Behavior: Behavior normal.       No results found for any visits on 02/23/21.  Assessment & Plan    1. Chest pain, unspecified type  Xrays are negative for any fractures. Will manage pain as below as he is on blood thinners. Counseled on return precautions.  - DG Chest 2 View; Future - DG Ribs Unilateral Left; Future - DG Humerus Left; Future - DG Shoulder Left; Future - HYDROcodone-acetaminophen (NORCO/VICODIN) 5-325 MG tablet; Take 1 tablet by mouth every 8 (eight) hours for 5 days.  Dispense: 15 tablet; Refill: 0   No follow-ups on file.      ITrinna Post, PA-C, have reviewed all documentation for this visit. The documentation on 02/26/21 for the exam, diagnosis, procedures, and orders are all accurate and complete.  The entirety of the information documented in the History of Present Illness, Review of Systems and Physical Exam were personally obtained by me. Portions of this information were initially documented by [facility] and reviewed by me for thoroughness and accuracy.     Paulene Floor  [facility] [phone] (phone) [phone] (fax)  [place]

## 2021-02-23 NOTE — Telephone Encounter (Signed)
Patient's wife is calling to report patient fell on Tuesday and injured his L shoulder. No swelling or bruising noted- but pain with movement. Appointment scheduled.  Reason for Disposition . Can't move injured shoulder normally (e.g., full range of motion, able to touch top of head)  Answer Assessment - Initial Assessment Questions 1. MECHANISM: "How did the injury happen?"     Fell on Tuesday 2. ONSET: "When did the injury happen?" (Minutes or hours ago)      Tuesday 3. APPEARANCE of INJURY: "What does the injury look like?"      No swelling/bruising 4. SEVERITY: "Can you move the shoulder normally?"      Yes- very painful 5. SIZE: For cuts, bruises, or swelling, ask: "How large is it?" (e.g., inches or centimeters;  entire joint)      no 6. PAIN: "Is there pain?" If Yes, ask: "How bad is the pain?"   (e.g., Scale 1-10; or mild, moderate, severe)     Yes- 5- not moving- 10 when tries to move 7. TETANUS: For any breaks in the skin, ask: "When was the last tetanus booster?"     n/a 8. OTHER SYMPTOMS: "Do you have any other symptoms?" (e.g., loss of sensation)     Pain in pectoral area 9. PREGNANCY: "Is there any chance you are pregnant?" "When was your last menstrual period?"     n/a  Protocols used: SHOULDER INJURY-A-AH

## 2021-02-26 ENCOUNTER — Telehealth: Payer: Self-pay

## 2021-02-26 NOTE — Progress Notes (Signed)
Patient was advised via another message. 

## 2021-02-26 NOTE — Telephone Encounter (Signed)
Patient's wife [name]) was advised and reports that results was reviewed on mychart as well.

## 2021-02-26 NOTE — Telephone Encounter (Signed)
-----   Message from Trinna Post, [place] sent at 02/26/2021  4:47 PM EST ----- Can we let patient know there were no fractures on his xray? There is the known lung nodule and some changes in his left lung the radiologist interpreted as the lung sticking together, in a sense. But no fractures. Continue with pain management and follow up if no improvement.

## 2021-03-08 DIAGNOSIS — E538 Deficiency of other specified B group vitamins: Secondary | ICD-10-CM | POA: Diagnosis not present

## 2021-03-08 DIAGNOSIS — E119 Type 2 diabetes mellitus without complications: Secondary | ICD-10-CM | POA: Diagnosis not present

## 2021-03-08 DIAGNOSIS — G3184 Mild cognitive impairment, so stated: Secondary | ICD-10-CM | POA: Diagnosis not present

## 2021-03-08 DIAGNOSIS — E559 Vitamin D deficiency, unspecified: Secondary | ICD-10-CM | POA: Diagnosis not present

## 2021-03-08 DIAGNOSIS — E519 Thiamine deficiency, unspecified: Secondary | ICD-10-CM | POA: Diagnosis not present

## 2021-03-09 ENCOUNTER — Other Ambulatory Visit (HOSPITAL_COMMUNITY): Payer: Self-pay | Admitting: Neurology

## 2021-03-09 ENCOUNTER — Other Ambulatory Visit: Payer: Self-pay | Admitting: Neurology

## 2021-03-09 DIAGNOSIS — G3184 Mild cognitive impairment, so stated: Secondary | ICD-10-CM

## 2021-03-12 ENCOUNTER — Other Ambulatory Visit: Payer: Self-pay | Admitting: Neurology

## 2021-03-12 DIAGNOSIS — G3184 Mild cognitive impairment, so stated: Secondary | ICD-10-CM

## 2021-03-12 DIAGNOSIS — L570 Actinic keratosis: Secondary | ICD-10-CM | POA: Diagnosis not present

## 2021-03-12 DIAGNOSIS — C4442 Squamous cell carcinoma of skin of scalp and neck: Secondary | ICD-10-CM | POA: Diagnosis not present

## 2021-03-12 DIAGNOSIS — C44229 Squamous cell carcinoma of skin of left ear and external auricular canal: Secondary | ICD-10-CM | POA: Diagnosis not present

## 2021-03-12 DIAGNOSIS — L821 Other seborrheic keratosis: Secondary | ICD-10-CM | POA: Diagnosis not present

## 2021-03-12 DIAGNOSIS — Z85828 Personal history of other malignant neoplasm of skin: Secondary | ICD-10-CM | POA: Diagnosis not present

## 2021-03-12 DIAGNOSIS — F0391 Unspecified dementia with behavioral disturbance: Secondary | ICD-10-CM

## 2021-03-13 ENCOUNTER — Encounter: Payer: Self-pay | Admitting: Pulmonary Disease

## 2021-03-13 ENCOUNTER — Other Ambulatory Visit: Payer: Self-pay

## 2021-03-13 ENCOUNTER — Ambulatory Visit (INDEPENDENT_AMBULATORY_CARE_PROVIDER_SITE_OTHER): Payer: Medicare Other | Admitting: Pulmonary Disease

## 2021-03-13 VITALS — BP 120/80 | HR 68 | Temp 97.0°F | Ht 69.5 in | Wt 195.0 lb

## 2021-03-13 DIAGNOSIS — I428 Other cardiomyopathies: Secondary | ICD-10-CM | POA: Diagnosis not present

## 2021-03-13 DIAGNOSIS — R0602 Shortness of breath: Secondary | ICD-10-CM

## 2021-03-13 DIAGNOSIS — J449 Chronic obstructive pulmonary disease, unspecified: Secondary | ICD-10-CM

## 2021-03-13 DIAGNOSIS — G4734 Idiopathic sleep related nonobstructive alveolar hypoventilation: Secondary | ICD-10-CM | POA: Diagnosis not present

## 2021-03-13 DIAGNOSIS — Z85118 Personal history of other malignant neoplasm of bronchus and lung: Secondary | ICD-10-CM | POA: Diagnosis not present

## 2021-03-13 DIAGNOSIS — G4733 Obstructive sleep apnea (adult) (pediatric): Secondary | ICD-10-CM

## 2021-03-13 NOTE — Patient Instructions (Signed)
Continue Trelegy 1 puff daily  Continue oxygen at nighttime  We will have [employer], and service your concentrator that is the best way to get them to leave instructions for you  We are ordering an echocardiogram to check your heart function  We will see you in follow-up in 2 to 3 months time call sooner should any new problems arise

## 2021-03-13 NOTE — Progress Notes (Signed)
Subjective:    Patient ID: Howard [name], male    DOB: [birth year], 87 y.o.   MRN: [id number]  Chief Complaint  Patient presents with  . Follow-up    C/o sob.    HPI Patient is an 87 year old former smoker with a history of moderate COPD who follows for the same.  He presents with increasing dyspnea.  Recall he was initially seen here in January 2022 please refer to that note for details.  Recall that he has had issues with sleep apnea but had been without CPAP machine since before November 2021.  This because of a recall of the unit.  He was having difficulty sleeping and was noted to have significant hypoxemia with sleep.  He is now on supplemental oxygen and is sleeping well.  He has however complaining bitterly of dyspnea during the day with any normal activity of daily living.  He has issues with memory impairment and history is confirmed by his wife who presents with him today.  He is on Trelegy Ellipta and feels that this controls wheezing due to COPD but he still feels very short of breath.  He has a history of complete heart block requiring pacer and nonischemic cardiomyopathy.  He has not had a 2D echo since 2020.  He has noted some orthopnea and since starting oxygen has not noticed paroxysmal nocturnal dyspnea.  He has not had any fevers, chills or sweats.  No hemoptysis of late.  He has been noted to have streaky hemoptysis in the past.  He does have a history of adenocarcinoma of the lung treated with SBRT and this is being followed by the cancer center he has an upcoming CT scan of the chest.  No other complaints are voiced today.  Review of Systems A 10 point review of systems was performed and it is as noted above otherwise negative.  Patient Active Problem List   Diagnosis Date Noted  . AAA (abdominal aortic aneurysm) without rupture ([place]) 12/10/2019  . Renal artery stenosis ([place]) 12/10/2019  . PAD (peripheral artery disease) ([place]) 12/10/2019  . Thromboembolism ([place]) 08/30/2019   . Atherosclerosis of native arteries of extremity with intermittent claudication ([place]) 08/23/2019  . Swelling of limb 08/17/2019  . Pain in limb 08/17/2019  . NICM (nonischemic cardiomyopathy) ([place]) 07/01/2019  . CHF (congestive heart failure) ([place]) 07/01/2019  . Malignant neoplasm of right lung ([place]) 04/18/2016  . CAD (coronary artery disease) 04/01/2016  . Adenocarcinoma ([place]) 04/01/2016  . Skin cyst 12/26/2015  . GERD (gastroesophageal reflux disease) 06/16/2015  . Abscess of back 06/06/2015  . Vertigo 03/27/2015  . Carpal tunnel syndrome 10/28/2014  . Status post cholecystectomy 09/29/2014  . Disease of digestive tract 09/29/2014  . Cardiomyopathy ([place]) 09/26/2014  . Type 2 diabetes mellitus without complications ([place])   . Sleep apnea   . Spinal stenosis, lumbar region, with neurogenic claudication 06/07/2014  . Lumbar stenosis with neurogenic claudication 06/07/2014  . Abnormal gait 08/20/2012  . H/O total knee replacement 08/20/2012  . Arthritis of knee, degenerative 08/20/2012  . Pacemaker-St.Jude 08/03/2012  . Cardiac conduction disorder 06/19/2012  . Acid reflux 06/18/2012  . 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 Triggering of digit 03/25/2012  . Hyperlipidemia 12/23/2011  . Essential hypertension 03/23/2011  . Atrioventricular block, complete ([place]) 03/23/2011  . Complete atrioventricular block ([facility]) 03/23/2011   Allergies  Allergen Reactions  . Sulfa Antibiotics Rash   Current Meds  Medication Sig  . acetaminophen (TYLENOL) 500 MG tablet Take 1,000 mg  by mouth every 6 (six) hours as needed.  Marland Kitchen albuterol (VENTOLIN HFA) 108 (90 Base) MCG/ACT inhaler Inhale 2 puffs into the lungs every 6 (six) hours as needed for wheezing or shortness of breath.  Marland Kitchen aspirin EC 81 MG tablet Take 81 mg by mouth daily.  . cetirizine (ZYRTEC) 10 MG tablet Take 10 mg by mouth daily.  . Cholecalciferol (VITAMIN D3) 1000 UNITS CAPS Take 1,000 Units by mouth daily.  Marland Kitchen donepezil (ARICEPT) 5  MG tablet Take 5 mg by mouth daily.  Marland Kitchen ELIQUIS 5 MG TABS tablet TAKE 1 TABLET TWICE A DAY  (SWITCHED FROM PLAVIX)  . ENTRESTO 24-26 MG TAKE 1 TABLET TWICE A DAY  . ezetimibe (ZETIA) 10 MG tablet TAKE 1 TABLET DAILY  . Fluticasone-Umeclidin-Vilant (TRELEGY ELLIPTA) 100-62.5-25 MCG/INH AEPB Inhale 1 puff into the lungs daily.  . isosorbide mononitrate (IMDUR) 30 MG 24 hr tablet TAKE 1 TABLET DAILY  . metFORMIN (GLUCOPHAGE) 500 MG tablet Take 1 tablet (500 mg total) by mouth 2 (two) times daily with a meal.  . metoprolol succinate (TOPROL-XL) 25 MG 24 hr tablet TAKE 1 TABLET AT BEDTIME  . montelukast (SINGULAIR) 10 MG tablet Take 10 mg by mouth daily as needed (sneezing/allergies.).  Marland Kitchen Multiple Vitamin (MULTIVITAMIN WITH MINERALS) TABS tablet Take 1 tablet by mouth daily. One-A-Day Multivitamin  . omeprazole (PRILOSEC) 40 MG capsule TAKE 1 CAPSULE TWICE DAILY AS NEEDED  . simvastatin (ZOCOR) 40 MG tablet TAKE 1 TABLET AT BEDTIME  . spironolactone (ALDACTONE) 25 MG tablet TAKE 1 TABLET DAILY (DOSE  DECREASE) (Patient taking differently: Take 50 mg by mouth daily.)  . triamcinolone (NASACORT) 55 MCG/ACT AERO nasal inhaler 2 sprays daily.  . [DISCONTINUED] FLOVENT HFA 110 MCG/ACT inhaler every 12 (twelve) hours as needed.   Immunization History  Administered Date(s) Administered  . Influenza, High Dose Seasonal PF 09/15/2015, 09/30/2017, 10/02/2018, 09/05/2020  . Influenza, Quadrivalent, Recombinant, Inj, Pf 10/12/2019  . Influenza-Unspecified 08/23/2013, 09/15/2015, 09/12/2016  . PFIZER(Purple Top)SARS-COV-2 Vaccination 12/30/2019, 01/20/2020, 10/05/2020  . Pneumococcal Conjugate-13 04/18/2016  . Pneumococcal Polysaccharide-23 09/30/2017  . Tdap 09/07/2011  . Zoster 11/22/2013  . Zoster Recombinat (Shingrix) 08/28/2018, 11/04/2018   Social History   Tobacco Use  . Smoking status: Former Smoker    Packs/day: 1.00    Years: 56.00    Pack years: 56.00    Types: Cigarettes    Quit date:  2011    Years since quitting: 11.3  . Smokeless tobacco: Never Used  Substance Use Topics  . Alcohol use: No       Objective:   Physical Exam BP 120/80 (BP Location: Left Arm, Cuff Size: Normal)   Pulse 68   Temp (!) 97 F (36.1 C) (Temporal)   Ht 5' 9.5" (1.765 m)   Wt 195 lb (88.5 kg)   SpO2 95%   BMI 28.38 kg/m  GENERAL: Well-developed well-nourished elderly gentleman in no acute distress.  No tachypnea, speech is fluent. No conversational dyspnea.  Presents in transport chair. HEAD: Normocephalic, atraumatic.  EYES: Pupils equal, round, reactive to light.  No scleral icterus.  MOUTH: Nose/mouth/throat not examined due to masking requirements for COVID 19. NECK: Supple. No thyromegaly. Trachea midline. No JVD.  No adenopathy. PULMONARY: Good air entry bilaterally.  Coarse breath sounds with no other adventitious sounds. CARDIOVASCULAR: S1 and S2. Regular rate and rhythm.  Pacer on left chest.  No rubs, murmurs or gallops heard. ABDOMEN: Benign. MUSCULOSKELETAL: No joint deformity, no clubbing, no edema.  NEUROLOGIC: Appears befuddled at  times, no overt focal deficit, speech is fluent. SKIN: Intact,warm,dry.  On limited exam, no rashes. PSYCH: Mood and behavior normal.  Mildly befuddled.      Assessment & Plan:     ICD-10-CM   1. COPD, moderate ([place])  J44.9 AMB REFERRAL FOR DME   Continue Trelegy and as needed albuterol Appears to be well compensated  2. Shortness of breath  R06.02 ECHOCARDIOGRAM COMPLETE   Out of proportion to pulmonary findings Query cardiac 2D echo  3. NICM (nonischemic cardiomyopathy) ([place])  I42.8    Last 2D echo 2020 Repeat echo as above  4. History of adenocarcinoma of lung  Z85.118    Being followed by the cancer center Has upcoming CT ordered  5. Nocturnal hypoxemia  G47.34    Continue supplemental O2 at nighttime   Orders Placed This Encounter  Procedures  . AMB REFERRAL FOR DME    Referral Priority:   Routine    Referral Type:    Durable Medical Equipment Purchase    Number of Visits Requested:   1  . ECHOCARDIOGRAM COMPLETE    Standing Status:   Future    Number of Occurrences:   1    Standing Expiration Date:   09/13/2021    Order Specific Question:   Where should this test be performed    Answer:   [facility]    Order Specific Question:   Perflutren DEFINITY (image enhancing agent) should be administered unless hypersensitivity or allergy exist    Answer:   Administer Perflutren    Order Specific Question:   Reason for exam-Echo    Answer:   Dyspnea  R06.00    Patient is to continue Trelegy.  Continue oxygen nocturnally.  At this point wife reports that he is sleeping better with oxygen and not having nocturnal awakenings.  Will await echocardiogram.  We will see him in follow-up in 2 to 3 months time he is to contact us prior to that time should any new difficulties arise.  Renold Don, MD [facility]  *This note was dictated using voice recognition software/Dragon.  Despite best efforts to proofread, errors can occur which can change the meaning.  Any change was purely unintentional.

## 2021-03-22 ENCOUNTER — Other Ambulatory Visit: Payer: Self-pay

## 2021-03-22 ENCOUNTER — Ambulatory Visit: Payer: Medicare Other | Attending: Neurology | Admitting: Speech Pathology

## 2021-03-22 ENCOUNTER — Ambulatory Visit: Payer: Medicare Other

## 2021-03-22 VITALS — BP 136/68 | HR 66 | Ht 69.0 in | Wt 195.0 lb

## 2021-03-22 DIAGNOSIS — M6281 Muscle weakness (generalized): Secondary | ICD-10-CM | POA: Diagnosis not present

## 2021-03-22 DIAGNOSIS — R2689 Other abnormalities of gait and mobility: Secondary | ICD-10-CM | POA: Diagnosis not present

## 2021-03-22 DIAGNOSIS — R41841 Cognitive communication deficit: Secondary | ICD-10-CM | POA: Insufficient documentation

## 2021-03-22 DIAGNOSIS — Z9181 History of falling: Secondary | ICD-10-CM | POA: Diagnosis not present

## 2021-03-22 DIAGNOSIS — R262 Difficulty in walking, not elsewhere classified: Secondary | ICD-10-CM | POA: Insufficient documentation

## 2021-03-22 NOTE — Therapy (Signed)
[place] MAIN [facility] SERVICES [address] [place], [place], [address] Phone: [phone]   Fax:  [phone]  Speech Language Pathology Evaluation  Patient Details  Name: Howard [name] MRN: [id number] Date of Birth: [birth year] Referring Provider (SLP): Dr. Manuella Ghazi   Encounter Date: 03/22/2021   End of Session - 03/22/21 1454    Visit Number 1    Number of Visits 25    Date for SLP Re-Evaluation 06/20/21    Authorization Type MCR    Authorization Time Period 03/22/20    Authorization - Visit Number 1    Progress Note Due on Visit 10    SLP Start Time 1350    SLP Stop Time  1445    SLP Time Calculation (min) 55 min    Activity Tolerance Patient tolerated treatment well           Past Medical History:  Diagnosis Date  . Arthritis   . Bell palsy   . Cancer [facility])    prostate and skin  . Chronic combined systolic and diastolic CHF, NYHA class 1 ([place])    a. 07/2014 Echo: EF 35-40%, Gr 1 DD.  Marland Kitchen Complete heart block ([place])    a. 11/2010 s/p SJM 2210 Accent DC PPM, ser# [id number].  . Depression   . Diabetes mellitus without complication ([place])   . Fall 11-10-14  . GERD (gastroesophageal reflux disease)   . History of prostate cancer   . Hyperlipidemia   . Hypertension   . LBBB (left bundle branch block)   . Left-sided Bell's palsy   . Lung cancer ([place]) 2016  . NICM (nonischemic cardiomyopathy) ([place])    a. 07/2014 Echo: EF 35-40%, mid-apicalanteroseptal DK, Gr 1 DD, mild-mod dil LA.  . Non-obstructive CAD    a. 07/2014 Abnl MV;  b. 08/2014 Cath: LM nl, LAD 30p, RI 40p, LCX nl, OM1 40, RCA dominant 30p, 70d-->Med Rx.  Marland Kitchen Poor balance   . Presence of permanent cardiac pacemaker   . Sleep apnea    a. cpap  . Vertigo   . WPW (Wolff-Parkinson-White syndrome)    a. S/P RFCA 1991.    Past Surgical History:  Procedure Laterality Date  . ABDOMINAL AORTIC ENDOVASCULAR STENT GRAFT  08/25/2019   Procedure: ABDOMINAL AORTIC ENDOVASCULAR STENT GRAFT;   Surgeon: Algernon Huxley, MD;  Location: [facility] ORS;  Service: Vascular;;  . ANGIOPLASTY Left 08/25/2019   Procedure: ANGIOPLASTY;  Surgeon: Algernon Huxley, MD;  Location: [facility] ORS;  Service: Vascular;  Laterality: Left;  left SFA and stent placement  . APPLICATION OF WOUND VAC Left 06/07/2015   Procedure: APPLICATION OF WOUND VAC;  Surgeon: Robert Bellow, MD;  Location: [facility] ORS;  Service: General;  Laterality: Left;  left upper back  . BACK SURGERY     2011  . CARDIAC CATHETERIZATION  08/26/2014   Single vessel obstructive CAD  . CARPAL TUNNEL RELEASE  04-04-15   [facility]  . CATARACT EXTRACTION  07-31-11 and 09-18-11  . Catheter ablation  1991   for WPW  . cervical fusion    . CHOLECYSTECTOMY  09-07-14  . ENDARTERECTOMY FEMORAL Left 08/25/2019   Procedure: ENDARTERECTOMY FEMORAL;  Surgeon: Algernon Huxley, MD;  Location: [facility] ORS;  Service: Vascular;  Laterality: Left;  common and produndis   . ENDOVASCULAR REPAIR/STENT GRAFT Right 08/25/2019   Procedure: ENDOVASCULAR REPAIR/STENT GRAFT;  Surgeon: Algernon Huxley, MD;  Location: [facility] ORS;  Service: Vascular;  Laterality: Right;  renal artery  .  HAND SURGERY     right 1993; left 2005  . HERNIA REPAIR  1955  . INSERT / REPLACE / REMOVE PACEMAKER    . INSERTION OF ILIAC STENT Bilateral 08/25/2019   Procedure: INSERTION OF ILIAC STENT;  Surgeon: Algernon Huxley, MD;  Location: [facility] ORS;  Service: Vascular;  Laterality: Bilateral;  . JOINT REPLACEMENT Left 2013   knee  . JOINT REPLACEMENT Right 2004   knee  . KNEE SURGERY     left knee 1991 and 1992; right knee 1995  . LEFT HEART CATHETERIZATION WITH CORONARY ANGIOGRAM N/A 08/26/2014   Procedure: LEFT HEART CATHETERIZATION WITH CORONARY ANGIOGRAM;  Surgeon: [name], MD;  Location: [facility] CATH LAB;  Service: Cardiovascular;  Laterality: N/A;  . LOWER EXTREMITY ANGIOGRAPHY Left 08/23/2019   Procedure: Lower Extremity Angiography;  Surgeon: Algernon Huxley, MD;  Location: [place] CV LAB;  Service: Cardiovascular;   Laterality: Left;  . LUMBAR LAMINECTOMY/DECOMPRESSION MICRODISCECTOMY N/A 06/07/2014   Procedure: LUMBAR FOUR TO FIVE LUMBAR LAMINECTOMY/DECOMPRESSION MICRODISCECTOMY 1 LEVEL;  Surgeon: [name], MD;  Location: [place] NEURO ORS;  Service: Neurosurgery;  Laterality: N/A;  . LUNG BIOPSY Right 2016   Dr [name]  . MOHS SURGERY    . PACEMAKER INSERTION     PPM-- St Jude 11/30/10 by [name]  . PPM GENERATOR CHANGEOUT N/A 07/09/2019   Procedure: PPM GENERATOR CHANGEOUT;  Surgeon: Deboraha Sprang, MD;  Location: [place] CV LAB;  Service: Cardiovascular;  Laterality: N/A;  . PROSTATE SURGERY     cancer--1998, prostatectomy  . REPLACEMENT TOTAL KNEE     2004  . ruptured disc     1962 and 1998  . TEE WITHOUT CARDIOVERSION N/A 09/01/2019   Procedure: TRANSESOPHAGEAL ECHOCARDIOGRAM (TEE);  Surgeon: [name], MD;  Location: [facility] ORS;  Service: Cardiovascular;  Laterality: N/A;  . TEMPORARY PACEMAKER N/A 07/09/2019   Procedure: TEMPORARY PACEMAKER;  Surgeon: Deboraha Sprang, MD;  Location: [place] CV LAB;  Service: Cardiovascular;  Laterality: N/A;  . TRIGGER FINGER RELEASE  01-24-15  . WOUND DEBRIDEMENT Left 06/07/2015   Procedure: DEBRIDEMENT WOUND;  Surgeon: Robert Bellow, MD;  Location: [facility] ORS;  Service: General;  Laterality: Left;  left upper back    There were no vitals filed for this visit.   Subjective Assessment - 03/22/21 1351    Subjective "I don't know, ask her." re: why he's here.    Patient is accompained by: Family member    Currently in Pain? No/denies              SLP Evaluation [facility] - 03/22/21 1511      SLP Visit Information   SLP Received On 03/22/21    Referring Provider (SLP) Dr. Manuella Ghazi    Onset Date 03/14/21    Medical Diagnosis Mild cognitive impairment      Subjective   Subjective Alert, pleasant, cooperative    Patient/Family Stated Goal manage finances better      Pain Assessment   Currently in Pain? 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   Overall Cognitive Status Impaired/Different from baseline    Area of Impairment Attention;Memory;Awareness    Current Attention Level Selective    Attention Comments difficulty with alternating attention, trailmaking    Memory Decreased short-term memory    Memory Comments forgets details from conversations or what he read, difficulty with details from story retell    Awareness Emergent    Awareness Comments verbalized awareness ("I'm not seeing them all") and attempted to correct    Attention Selective;Alternating    Selective Attention Appears intact   has hearing loss, however, so quiet environment improves accuracy   Alternating Attention Impaired    Memory Impaired    Memory Impairment Decreased recall of new information;Decreased short term memory;Prospective memory    Problem Solving Impaired    Problem Solving Impairment Verbal basic;Functional basic   errors in finance tasks   Executive Function Sequencing;Organizing   difficulty sequencing steps to computer based tasks     Auditory Comprehension   Overall Auditory Comprehension Appears within functional limits for tasks assessed    Yes/No Questions Within Functional Limits    Commands Within Functional Limits   needs repetition due to hearing loss   Conversation Moderately complex    Other Conversation Comments forgets details    Interfering Components Hearing    EffectiveTechniques Repetition;Pausing;Increased volume;Slowed speech;Stressing words      Visual Recognition/Discrimination   Discrimination Not  tested      Reading Comprehension   Reading Status Within funtional limits      Expression   Primary Mode of Expression Verbal      Verbal Expression   Overall Verbal Expression Impaired    Initiation No impairment    Automatic Speech Name;Social Response    Level of Generative/Spontaneous Verbalization Conversation    Naming Impairment    Confrontation 75-100% accurate   100%   Divergent --   15 animals in 60 seconds, 5 m words   Pragmatics No impairment      Written Expression   Dominant Hand Right    Written Expression Not tested      Oral Motor/Sensory Function   Overall Oral Motor/Sensory Function Appears within functional limits for tasks assessed      Motor Speech   Overall Motor Speech Appears within functional limits for tasks assessed      Standardized Assessments   Standardized Assessments  Cognitive Linguistic Quick Test        AGE - 70-89   Cognitive Linguistic Quick Test   The Cognitive Linguistic Quick Test (CLQT) was administered to assess the relative status of five cognitive domains: attention, memory, language, executive functioning, and visuospatial skills. Scores from 10 tasks were used to estimate severity ratings (for age groups 18-69 years and 70-89 years) for each domain, a clock drawing task, as well as an overall composite severity rating of cognition.        Task    Score  Criterion Cut Scores   Personal Facts                 8/8   8    Symbol Cancellation     11/12  10  Confrontation Naming    10/10            10  Clock Drawing      11/13  11  Story Retelling                           5/10   5   Symbol Trails      2/10   6   Generative Naming      4/9   4   Design Memory      3/6   4   [place]azes        7/8   4  Design Generation    7/13   5   Cognitive Domain  Composite Score Severity Rating   Attention   156/215  WNL    Memory   120/185  Mild   Executive Function  20/40   WNL   Language   27/37   Mild    Visuospatial Skills  66/105  WNL   Clock Drawing  11/13   WNL   Composite Severity Rating 3.6  WNL   Administered patient self-reported outcome measure Multifactorial Memory Questionnaire (MMQ) Memory Mistakes: Raw score = 37, t score = 39, equivalent to Below average (Below 20 "very low", 20-29 "low", 30-39 "below average", 40-60 "average", 60-70 "above average", 71-80 "high", above 80 "very high")                        SLP Education - 03/22/21 1524    Education Details SLP POC, memory strategies    Person(s) Educated Patient;Spouse    Methods Explanation;Verbal cues;Handout    Comprehension Verbalized understanding;Need further instruction            SLP Short Term Goals - 03/22/21 1505      SLP SHORT TERM GOAL #1   Title Patient will complete high level wordfinding tasks >80% accuracy.    Time 10    Period --   sessions   Status New      SLP SHORT TERM GOAL #2   Title Patient will verbalize daily schedule, recent events and salient information using external memory aids >80% accuracy with moderate cues    Time 10    Period --   sessions   Status New      SLP SHORT TERM GOAL #3   Title Patient will verbally or written sequence steps in preferred computer tasks >80% accuracy with moderate cues    Time 10    Period --   sessions   Status New      SLP SHORT TERM GOAL #4   Title Patient will identify errors in simple-mod complex functional finance tasks >75% of the time with min cues for double checking.    Time 10    Period --   sessions   Status New            SLP Long Term Goals - 03/22/21 1501      SLP LONG TERM GOAL #1   Title Patient will verbalize daily schedule, recent events and salient information using external memory aids >90% accuracy with modified independence    Time 12    Period Weeks    Status New      SLP LONG TERM GOAL #2   Title Patient will verbally or written sequence steps in preferred computer tasks >90% accuracy  with modified independence (use of external aids if necessary)    Time 12    Period Weeks    Status New      SLP LONG TERM GOAL #3   Title  Verbalize and carryover strategies for communicative effectiveness in conversations.    Time 12    Period Weeks    Status New      SLP LONG TERM GOAL #4   Title Pt will report fewer "memory mistakes" as measured by Multifactorial Memory Questionnaire (Ability).    Baseline at eval: t score 39 = "below average"    Time 12    Period Weeks    Status New      SLP LONG TERM GOAL #5   Title Patient will self-correct errors in simple-mod complex functional finance tasks >85% of the time.    Time 12    Period Weeks    Status New            Plan - 03/22/21 1511    Clinical Impression Statement Patient presents with overall mild for age deficits in memory and language per Cognitive Linguistic Quick Test. Patient and wife reported memory mistakes such losing items, difficulty remembering appointments and special dates, details from conversations. He has had more difficulty with finances lately; wife assists. He has trouble remembering how to do tasks he once enjoyed or needed to do using the computer. He acknowledged frustration with forgetting names. Patient was observed to self-monitor much of the time, and while he was aware of some mistakes in cognitive tasks, he had difficulty correcting them without cues. I recommend skilled ST for training in compensations to maximize indepedence, improve wordfinding and ability to recall information, as well as for training in appropriate home program/activities to maintain cognitive and communication function.    Speech Therapy Frequency 2x / week    Duration 12 weeks    Treatment/Interventions Cueing hierarchy;SLP instruction and feedback;Cognitive reorganization;Functional tasks;Compensatory strategies;Internal/external aids;Multimodal communcation approach;Patient/family education    Potential to Achieve Goals  Good    SLP Home Exercise Plan see pt instructions    Consulted and Agree with Plan of Care Patient           Patient will benefit from skilled therapeutic intervention in order to improve the following deficits and impairments:   Cognitive communication deficit    Problem List Patient Active Problem List   Diagnosis Date Noted  . AAA (abdominal aortic aneurysm) without rupture ([place]) 12/10/2019  . Renal artery stenosis ([place]) 12/10/2019  . PAD (peripheral artery disease) ([place]) 12/10/2019  . Thromboembolism ([place]) 08/30/2019  . Atherosclerosis of native arteries of extremity with intermittent claudication ([place]) 08/23/2019  . Swelling of limb 08/17/2019  . Pain in limb 08/17/2019  . NICM (nonischemic cardiomyopathy) ([place]) 07/01/2019  . CHF (congestive heart failure) ([place]) 07/01/2019  . Malignant neoplasm of right lung ([place]) 04/18/2016  . CAD (coronary artery disease) 04/01/2016  . Adenocarcinoma ([place]) 04/01/2016  . Skin cyst 12/26/2015  . GERD (gastroesophageal reflux disease) 06/16/2015  . Abscess of back 06/06/2015  . Vertigo 03/27/2015  . Carpal tunnel syndrome 10/28/2014  . Status post cholecystectomy 09/29/2014  . Disease of digestive tract 09/29/2014  . Cardiomyopathy ([place]) 09/26/2014  . Type 2 diabetes mellitus without complications ([place])   . Sleep apnea   . Spinal stenosis, lumbar region, with neurogenic claudication 06/07/2014  . Lumbar stenosis with neurogenic claudication 06/07/2014  . Abnormal gait 08/20/2012  . H/O total knee replacement 08/20/2012  . Arthritis of knee, degenerative 08/20/2012  . Pacemaker-St.Jude 08/03/2012  . Cardiac conduction disorder 06/19/2012  . Acid reflux 06/18/2012  . 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 Triggering of digit 03/25/2012  . Hyperlipidemia 12/23/2011  . Essential hypertension 03/23/2011  .  Atrioventricular block, complete ([place]) 03/23/2011  . Complete atrioventricular block ([place]) 03/23/2011   Deneise Lever, [place],  CCC-SLP Speech-Language Pathologist  Aliene Altes 03/22/2021, 3:32 PM  [place] MAIN [facility] SERVICES [address] [place], [place], [address] Phone: [phone]   Fax:  [phone]  Name: Howard [name] MRN: [id number] Date of Birth: Jun 29, 1931

## 2021-03-22 NOTE — Therapy (Signed)
[place] MAIN [facility] SERVICES [address] [place], [place], [address] Phone: [phone]   Fax:  [phone]  Physical Therapy Evaluation  Patient Details  Name: Howard [name] MRN: [id number] Date of Birth: 1931/10/19 Referring Provider (PT): Dr. Joselyn Arrow   Encounter Date: 03/22/2021   PT End of Session - 03/22/21 1657    Visit Number 1    Number of Visits 25    Date for PT Re-Evaluation 06/14/21    Authorization Type initial Cert 7/51/7001- 7/49/4496    PT Start Time 1515    PT Stop Time 1630    PT Time Calculation (min) 75 min    Equipment Utilized During Treatment Gait belt    Activity Tolerance Patient tolerated treatment well    Behavior During Therapy [facility] for tasks assessed/performed           Past Medical History:  Diagnosis Date  . Arthritis   . Bell palsy   . Cancer [facility])    prostate and skin  . Chronic combined systolic and diastolic CHF, NYHA class 1 ([place])    a. 07/2014 Echo: EF 35-40%, Gr 1 DD.  Marland Kitchen Complete heart block ([place])    a. 11/2010 s/p SJM 2210 Accent DC PPM, ser# [id number].  . Depression   . Diabetes mellitus without complication ([place])   . Fall 11-10-14  . GERD (gastroesophageal reflux disease)   . History of prostate cancer   . Hyperlipidemia   . Hypertension   . LBBB (left bundle branch block)   . Left-sided Bell's palsy   . Lung cancer ([place]) 2016  . NICM (nonischemic cardiomyopathy) ([place])    a. 07/2014 Echo: EF 35-40%, mid-apicalanteroseptal DK, Gr 1 DD, mild-mod dil LA.  . Non-obstructive CAD    a. 07/2014 Abnl MV;  b. 08/2014 Cath: LM nl, LAD 30p, RI 40p, LCX nl, OM1 40, RCA dominant 30p, 70d-->Med Rx.  Marland Kitchen Poor balance   . Presence of permanent cardiac pacemaker   . Sleep apnea    a. cpap  . Vertigo   . WPW (Wolff-Parkinson-White syndrome)    a. S/P RFCA 1991.    Past Surgical History:  Procedure Laterality Date  . ABDOMINAL AORTIC ENDOVASCULAR STENT GRAFT  08/25/2019   Procedure: ABDOMINAL AORTIC  ENDOVASCULAR STENT GRAFT;  Surgeon: Algernon Huxley, MD;  Location: [facility] ORS;  Service: Vascular;;  . ANGIOPLASTY Left 08/25/2019   Procedure: ANGIOPLASTY;  Surgeon: Algernon Huxley, MD;  Location: [facility] ORS;  Service: Vascular;  Laterality: Left;  left SFA and stent placement  . APPLICATION OF WOUND VAC Left 06/07/2015   Procedure: APPLICATION OF WOUND VAC;  Surgeon: Robert Bellow, MD;  Location: [facility] ORS;  Service: General;  Laterality: Left;  left upper back  . BACK SURGERY     2011  . CARDIAC CATHETERIZATION  08/26/2014   Single vessel obstructive CAD  . CARPAL TUNNEL RELEASE  04-04-15   [facility]  . CATARACT EXTRACTION  07-31-11 and 09-18-11  . Catheter ablation  1991   for WPW  . cervical fusion    . CHOLECYSTECTOMY  09-07-14  . ENDARTERECTOMY FEMORAL Left 08/25/2019   Procedure: ENDARTERECTOMY FEMORAL;  Surgeon: Algernon Huxley, MD;  Location: [facility] ORS;  Service: Vascular;  Laterality: Left;  common and produndis   . ENDOVASCULAR REPAIR/STENT GRAFT Right 08/25/2019   Procedure: ENDOVASCULAR REPAIR/STENT GRAFT;  Surgeon: Algernon Huxley, MD;  Location: [facility] ORS;  Service: Vascular;  Laterality: Right;  renal artery  .  HAND SURGERY     right 1993; left 2005  . HERNIA REPAIR  1955  . INSERT / REPLACE / REMOVE PACEMAKER    . INSERTION OF ILIAC STENT Bilateral 08/25/2019   Procedure: INSERTION OF ILIAC STENT;  Surgeon: Algernon Huxley, MD;  Location: [facility] ORS;  Service: Vascular;  Laterality: Bilateral;  . JOINT REPLACEMENT Left 2013   knee  . JOINT REPLACEMENT Right 2004   knee  . KNEE SURGERY     left knee 1991 and 1992; right knee 1995  . LEFT HEART CATHETERIZATION WITH CORONARY ANGIOGRAM N/A 08/26/2014   Procedure: LEFT HEART CATHETERIZATION WITH CORONARY ANGIOGRAM;  Surgeon: [name], MD;  Location: [facility] CATH LAB;  Service: Cardiovascular;  Laterality: N/A;  . LOWER EXTREMITY ANGIOGRAPHY Left 08/23/2019   Procedure: Lower Extremity Angiography;  Surgeon: Algernon Huxley, MD;  Location: [place] CV LAB;   Service: Cardiovascular;  Laterality: Left;  . LUMBAR LAMINECTOMY/DECOMPRESSION MICRODISCECTOMY N/A 06/07/2014   Procedure: LUMBAR FOUR TO FIVE LUMBAR LAMINECTOMY/DECOMPRESSION MICRODISCECTOMY 1 LEVEL;  Surgeon: [name], MD;  Location: [place] [facility] ORS;  Service: Neurosurgery;  Laterality: N/A;  . LUNG BIOPSY Right 2016   Dr [name]  . MOHS SURGERY    . PACEMAKER INSERTION     PPM-- St Jude 11/30/10 by [name]  . PPM GENERATOR CHANGEOUT N/A 07/09/2019   Procedure: PPM GENERATOR CHANGEOUT;  Surgeon: Deboraha Sprang, MD;  Location: [place] CV LAB;  Service: Cardiovascular;  Laterality: N/A;  . PROSTATE SURGERY     cancer--1998, prostatectomy  . REPLACEMENT TOTAL KNEE     2004  . ruptured disc     1962 and 1998  . TEE WITHOUT CARDIOVERSION N/A 09/01/2019   Procedure: TRANSESOPHAGEAL ECHOCARDIOGRAM (TEE);  Surgeon: [name], MD;  Location: [facility] ORS;  Service: Cardiovascular;  Laterality: N/A;  . TEMPORARY PACEMAKER N/A 07/09/2019   Procedure: TEMPORARY PACEMAKER;  Surgeon: Deboraha Sprang, MD;  Location: [place] CV LAB;  Service: Cardiovascular;  Laterality: N/A;  . TRIGGER FINGER RELEASE  01-24-15  . WOUND DEBRIDEMENT Left 06/07/2015   Procedure: DEBRIDEMENT WOUND;  Surgeon: Robert Bellow, MD;  Location: [facility] ORS;  Service: General;  Laterality: Left;  left upper back    Vitals:   03/22/21 1543  BP: 136/68  Pulse: 66  Weight: 195 lb (88.5 kg)  Height: 5\' 9"  (1.753 m)      Subjective Assessment - 03/22/21 1555    Subjective Patient reports he is here to work on his balance  as he is becoming more sedendary and having issues with poor balance. He reports he prefers to walk with his forearm crutch over any other assistive device.    Pertinent History Patient is a 87 year old male referred to outpatient PT by Dr. Jennings Books for diagnosis of Imbalance. Patient presents with past medical history of Arthritis, Bell's Palsy, Prostate Cancer, Congestive Heart failure, Heart block,  Depression, DM, Falls, GERD, Hypertension, Coronary artery disease , Pacemaker, Sleep apnea, Vertigo, Wolf- Parkinson white syndrome, Both Left and Right TKA, 5 lumbar surgeries including laminectomy, decompression, and microdisectomy.    Limitations Lifting;Standing;Walking;House hold activities    How long can you sit comfortably? No restrictions.    How long can you stand comfortably? Not too long    How long can you walk comfortably? Less than a block    Patient Stated Goals Patient would like to be able to walk around without the crutch and not fall.  Currently in Pain? Yes    Pain Score --   Patient reports no pain at rest today but does report daily low back pain - arthritis and degenerative changes. he did not rate today except to say 0/10 during visit.   Pain Location Back    Pain Descriptors / Indicators Aching    Pain Type Chronic pain    Pain Onset More than a month ago    Pain Frequency Intermittent    Pain Relieving Factors Rest    Effect of Pain on Daily Activities I am very limited and don't do much.    Multiple Pain Sites No              [facility] PT Assessment - 03/22/21 1532      Assessment   Medical Diagnosis Imbalance    Referring Provider (PT) Dr. Joselyn Arrow    Hand Dominance Right    Next MD Visit Several upcoming in April    Prior Therapy No      Precautions   Precautions Fall;ICD/Pacemaker      Restrictions   Weight Bearing Restrictions No      Balance Screen   Has the patient fallen in the past 6 months Yes    How many times? 1   Tripped over a rug while wearing bedroom shoes.   Has the patient had a decrease in activity level because of a fear of falling?  Yes    Is the patient reluctant to leave their home because of a fear of falling?  No      Home Environment   Living Environment Private residence    Living Arrangements Spouse/significant other    Available Help at Discharge Family    Type of [place] to enter     Entrance Stairs-Number of Steps 3 with bilateral rails    Entrance Stairs-Rails Can reach both    Home Layout Two level;Laundry or work area in basement    Alternate Therapist, sports of Steps 1 flight    Alternate Level Stairs-Rails Can reach both    Home Equipment Other (comment);Crutches   Forearm crutch     Prior Function   Level of Independence Independent with basic ADLs    Vocation Retired   retired Market researcher   Leisure Watch History channel and eat      Cognition   Overall Cognitive Status Impaired/Different from baseline    Area of Impairment Attention;Memory;Awareness    Current Attention Level Selective    Attention Comments difficulty with alternating attention, trailmaking    Memory Decreased short-term memory      Observation/Other Assessments   Skin Integrity intact with light touch except left foot   Decreased light touch sensation in left foot   Focus on Therapeutic Outcomes (FOTO)  53      Sensation   Light Touch Appears Intact   except left foot numbness- decreased with light touch     Coordination   Finger Nose Finger Test intact    Heel Shin Test intact      Standardized Balance Assessment   Standardized Balance Assessment Berg Balance Test      Berg Balance Test   Sit to Stand Able to stand without using hands and stabilize independently    Standing Unsupported Able to stand safely 2 minutes    Sitting with Back Unsupported but Feet Supported on Floor or Stool Able to sit safely and securely 2 minutes  Stand to Sit Sits safely with minimal use of hands    Transfers Able to transfer safely, minor use of hands    Standing Unsupported with Eyes Closed Able to stand 10 seconds with supervision    Standing Unsupported with Feet Together Able to place feet together independently and stand for 1 minute with supervision    From Standing, Reach Forward with Outstretched Arm Can reach confidently >25 cm (10")    From Standing Position, Pick up Object from  Floor Able to pick up shoe, needs supervision    From Standing Position, Turn to Look Behind Over each Shoulder Turn sideways only but maintains balance    Turn 360 Degrees Able to turn 360 degrees safely but slowly    Standing Unsupported, Alternately Place Feet on Step/Stool Able to complete >2 steps/needs minimal assist    Standing Unsupported, One Foot in Front Needs help to step but can hold 15 seconds    Standing on One Leg Tries to lift leg/unable to hold 3 seconds but remains standing independently    Total Score 40           OBJECTIVE  MUSCULOSKELETAL: Tremor: Absent Bulk: Normal Tone: Normal, no clonus  Posture Forward head/protracted shoulders  Gait Decreased step length bilaterally, use of left side forearm crutch-   Strength R/L 3+/3+ Hip flexion 3+/3+ Hip external rotation 3+/3+ Hip internal rotation 3+/3+ Hip extension  3+/3+ Hip abduction 3+/3+ Hip adduction 4/4 Knee extension 4/4 Knee flexion 4/4 Ankle Plantarflexion 4/4 Ankle Dorsiflexion   NEUROLOGICAL:      Sensation Grossly intact to light touch bilateral UEs/LEs as determined by testing dermatomes C2-T2/L2-S2 respectively Proprioception and hot/cold testing deferred on this date  Coordination/Cerebellar Finger to Nose: WNL Heel to Shin: WNL Rapid alternating movements: WNL Finger Opposition: WNL   FUNCTIONAL OUTCOME MEASURES   Results Comments  BERG 40/56 Fall risk, in need of intervention  DGI /24 Not tested on eval- may test next session      TUG 16.36econds   5TSTS  16 seconds   6 Minute Walk Test  Not tested- will test next session  10 Meter Gait Speed Self-selected: 0.6 m/s;  Below normative values for full community ambulation      ASSESSMENT Clinical Impression: Pt is a pleasant 88 year-old male/male referred for difficulty with balance. PT examination reveals deficits including decreased bilateral LE strength, Limited and impaired gait and balance  according to  Timed Up and Go test, 10 MWT, and BERG balance test.  Pt will benefit from skilled PT services to address deficits in balance and decrease risk for future falls.                            Objective measurements completed on examination: See above findings.               PT Education - 03/22/21 1656    Education Details Importance of vision and sensation as they relate to balance and use of appropriate assistive device with walking/balance    Person(s) Educated Patient    Methods Explanation;Demonstration;Tactile cues;Verbal cues    Comprehension Verbalized understanding;Returned demonstration;Verbal cues required;Tactile cues required            PT Short Term Goals - 03/22/21 1703      PT SHORT TERM GOAL #1   Title Pt will be independent with HEP in order to improve strength and balance in order to decrease fall risk  and improve function at home and work.    Baseline 03/22/2021- Patient has no formal HEP in place.    Time 6    Period Weeks    Status New    Target Date 05/03/21             PT Long Term Goals - 03/22/21 1704      PT LONG TERM GOAL #1   Title Patient will increase FOTO score to equal to  61 or greater  to demonstrate statistically significant improvement in mobility and quality of life.    Baseline 03/22/2021- FOTO SCORE=53    Time 12    Period Weeks    Status New    Target Date 06/14/21      PT LONG TERM GOAL #2   Title Pt will improve BERG by at least 5 points in order to demonstrate clinically significant improvement in balance.    Baseline 03/22/2021- 40/56    Time 12    Period Weeks    Status New    Target Date 06/14/21      PT LONG TERM GOAL #3   Title Pt will decrease 5TSTS by at least 3 seconds in order to demonstrate clinically significant improvement in LE strength.    Baseline 03/22/2021= 16.36 sec without UE support    Time 12    Period Weeks    Status New    Target Date 06/14/21      PT LONG TERM GOAL #4    Title Pt will decrease TUG to below 13 seconds/decrease in order to demonstrate decreased fall risk.    Baseline 03/22/2021= 16.5 sec using front wheeled walker    Time 12    Period Weeks    Status New    Target Date 06/14/21      PT LONG TERM GOAL #5   Title Pt will increase 10MWT by at least 0.13 m/s in order to demonstrate clinically significant improvement in community ambulation.    Baseline 03/22/2021= 0.63 m/s using 1 forearm crutch.    Time 12    Period Weeks    Status New    Target Date 06/14/21                  Plan - 03/22/21 1659    Clinical Impression Statement Pt is a pleasant 87 year-old male/male referred for difficulty with balance. PT examination reveals deficits including decreased bilateral LE strength, Limited and impaired gait and balance  according to Timed Up and Go test, 10 MWT, and BERG balance test.  Pt will benefit from skilled PT services to address deficits in balance and decrease risk for future falls.    Personal Factors and Comorbidities Age;Comorbidity 3+    Comorbidities Multiple spinal surgeries, Arthritis, Diabetes    Examination-Activity Limitations [place];Caring for Others;Carry;Lift;Squat;Stairs;Stand    Examination-Participation Restrictions Cleaning;Community Activity;Laundry;Yard Work    Engineer, water    Rehab Potential Good    PT Frequency 2x / week    PT Duration 12 weeks    PT Treatment/Interventions ADLs/Self Care Home Management;Canalith Repostioning;Cryotherapy;Moist Heat;DME Instruction;Gait training;Stair training;Functional mobility training;Therapeutic activities;Therapeutic exercise;Balance training;Neuromuscular re-education;Patient/family education;Manual techniques;Passive range of motion;Dry needling;Vestibular    PT Next Visit Plan Instruct in beginner balance activities and progressive LE Strengthening. Test DGI and 6 min walk test next  1-2 sessions- add goals as appropriate.    PT Home Exercise Plan To be initiated next visit    Consulted  and Agree with Plan of Care Patient;Family member/caregiver    Family Member Consulted WIfe- [name]           Patient will benefit from skilled therapeutic intervention in order to improve the following deficits and impairments:  Abnormal gait,Cardiopulmonary status limiting activity,Decreased activity tolerance,Decreased balance,Decreased cognition,Decreased coordination,Decreased endurance,Decreased knowledge of use of DME,Decreased mobility,Decreased strength,Difficulty walking,Impaired sensation,Pain  Visit Diagnosis: Difficulty in walking, not elsewhere classified  Other abnormalities of gait and mobility  History of falling  Muscle weakness (generalized)     Problem List Patient Active Problem List   Diagnosis Date Noted  . AAA (abdominal aortic aneurysm) without rupture ([place]) 12/10/2019  . Renal artery stenosis ([place]) 12/10/2019  . PAD (peripheral artery disease) ([place]) 12/10/2019  . Thromboembolism ([place]) 08/30/2019  . Atherosclerosis of native arteries of extremity with intermittent claudication ([place]) 08/23/2019  . Swelling of limb 08/17/2019  . Pain in limb 08/17/2019  . NICM (nonischemic cardiomyopathy) ([place]) 07/01/2019  . CHF (congestive heart failure) ([place]) 07/01/2019  . Malignant neoplasm of right lung ([place]) 04/18/2016  . CAD (coronary artery disease) 04/01/2016  . Adenocarcinoma ([place]) 04/01/2016  . Skin cyst 12/26/2015  . GERD (gastroesophageal reflux disease) 06/16/2015  . Abscess of back 06/06/2015  . Vertigo 03/27/2015  . Carpal tunnel syndrome 10/28/2014  . Status post cholecystectomy 09/29/2014  . Disease of digestive tract 09/29/2014  . Cardiomyopathy ([place]) 09/26/2014  . Type 2 diabetes mellitus without complications ([place])   . Sleep apnea   . Spinal stenosis, lumbar region, with neurogenic claudication 06/07/2014  . Lumbar stenosis with neurogenic  claudication 06/07/2014  . Abnormal gait 08/20/2012  . H/O total knee replacement 08/20/2012  . Arthritis of knee, degenerative 08/20/2012  . Pacemaker-St.Jude 08/03/2012  . Cardiac conduction disorder 06/19/2012  . Acid reflux 06/18/2012  . 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 Triggering of digit 03/25/2012  . Hyperlipidemia 12/23/2011  . Essential hypertension 03/23/2011  . Atrioventricular block, complete ([place]) 03/23/2011  . Complete atrioventricular block ([place]) 03/23/2011    Lewis Moccasin, PT 03/22/2021, 5:35 PM  [place] MAIN [place] SERVICES [address] [place], [place], [address] Phone: [phone]   Fax:  [phone]  Name: SHAMARR FAUCETT MRN: [id number] Date of Birth: 26-Nov-1931

## 2021-03-22 NOTE — Patient Instructions (Signed)
Bring a 3 ring binder with you next time you come to therapy.    Memory Compensation Strategies  1. Use "WARM" strategy. W= write it down A=  associate it R=  repeat it M=  make a mental picture  2. You can keep a Social worker. Use a 3-ring notebook with sections for the following:  calendar, important names and phone numbers, medications, doctors' names/phone numbers, "to do list"/reminders, and a section to journal what you did each day  3. Use a calendar to write appointments down.  4. Write yourself a schedule for the day.  This can be placed on the calendar or in a separate section of the Memory Notebook.  Keeping a regular schedule can help memory.  5. Use medication organizer with sections for each day or morning/evening pills  You may need help loading it  6. Keep a basket, or pegboard by the door.   Place items that you need to take out with you in the basket or on the pegboard.  You may also want to include a message board for reminders.  7. Use sticky notes. Place sticky notes with reminders in a place where the task is performed.  For example:  "turn off the stove" placed by the stove, "lock the door" placed on the door at eye level, "take your medications" on the bathroom mirror or by the place where you normally take your medications  8. Use alarms/timers.  Use while cooking to remind yourself to check on food or as a reminder to take your medicine, or as a reminder to make a call, or as a reminder to perform another task, etc.  9. Use a voice recorder app or small tape recorder to record important information and notes for yourself. Go back at the end of the day and listen to these.

## 2021-03-23 ENCOUNTER — Other Ambulatory Visit: Payer: Self-pay | Admitting: Internal Medicine

## 2021-03-23 ENCOUNTER — Other Ambulatory Visit: Payer: Self-pay | Admitting: Cardiovascular Disease

## 2021-03-23 NOTE — Telephone Encounter (Signed)
Rx request sent to pharmacy.  

## 2021-03-26 ENCOUNTER — Ambulatory Visit
Admission: RE | Admit: 2021-03-26 | Discharge: 2021-03-26 | Disposition: A | Discharge status: Home / Self Care | Payer: Medicare Other | Source: Ambulatory Visit | Attending: Neurology | Admitting: Neurology

## 2021-03-26 ENCOUNTER — Other Ambulatory Visit: Payer: Self-pay

## 2021-03-26 DIAGNOSIS — F0391 Unspecified dementia with behavioral disturbance: Secondary | ICD-10-CM | POA: Diagnosis not present

## 2021-03-26 DIAGNOSIS — I6782 Cerebral ischemia: Secondary | ICD-10-CM | POA: Diagnosis not present

## 2021-03-26 DIAGNOSIS — G9389 Other specified disorders of brain: Secondary | ICD-10-CM | POA: Diagnosis not present

## 2021-03-26 DIAGNOSIS — G3184 Mild cognitive impairment, so stated: Secondary | ICD-10-CM | POA: Diagnosis not present

## 2021-03-27 ENCOUNTER — Ambulatory Visit: Payer: Medicare Other | Attending: Neurology | Admitting: Speech Pathology

## 2021-03-27 DIAGNOSIS — R41841 Cognitive communication deficit: Secondary | ICD-10-CM | POA: Diagnosis not present

## 2021-03-27 DIAGNOSIS — R2681 Unsteadiness on feet: Secondary | ICD-10-CM | POA: Insufficient documentation

## 2021-03-27 DIAGNOSIS — M6281 Muscle weakness (generalized): Secondary | ICD-10-CM | POA: Insufficient documentation

## 2021-03-27 DIAGNOSIS — R0602 Shortness of breath: Secondary | ICD-10-CM | POA: Diagnosis not present

## 2021-03-27 DIAGNOSIS — R2689 Other abnormalities of gait and mobility: Secondary | ICD-10-CM | POA: Diagnosis not present

## 2021-03-27 DIAGNOSIS — R262 Difficulty in walking, not elsewhere classified: Secondary | ICD-10-CM | POA: Diagnosis not present

## 2021-03-27 NOTE — Therapy (Signed)
[place] MAIN [facility] SERVICES [address] [place], [place], [address] Phone: [phone]   Fax:  [phone]  Speech Language Pathology Treatment  Patient Details  Name: Howard [name] MRN: [id number] Date of Birth: 09-Jul-1931 Referring Provider (SLP): Dr. Manuella Ghazi   Encounter Date: 03/27/2021   End of Session - 03/27/21 1438    Visit Number 2    Number of Visits 25    Date for SLP Re-Evaluation 06/20/21    Authorization Type MCR    Authorization Time Period 03/22/20    Authorization - Visit Number 2    Progress Note Due on Visit 10    SLP Start Time 1300    SLP Stop Time  1400    SLP Time Calculation (min) 60 min    Activity Tolerance Patient tolerated treatment well           Past Medical History:  Diagnosis Date  . Arthritis   . Bell palsy   . Cancer [facility])    prostate and skin  . Chronic combined systolic and diastolic CHF, NYHA class 1 ([place])    a. 07/2014 Echo: EF 35-40%, Gr 1 DD.  Marland Kitchen Complete heart block ([place])    a. 11/2010 s/p SJM 2210 Accent DC PPM, ser# [id number].  . Depression   . Diabetes mellitus without complication ([place])   . Fall 11-10-14  . GERD (gastroesophageal reflux disease)   . History of prostate cancer   . Hyperlipidemia   . Hypertension   . LBBB (left bundle branch block)   . Left-sided Bell's palsy   . Lung cancer ([place]) 2016  . NICM (nonischemic cardiomyopathy) ([place])    a. 07/2014 Echo: EF 35-40%, mid-apicalanteroseptal DK, Gr 1 DD, mild-mod dil LA.  . Non-obstructive CAD    a. 07/2014 Abnl MV;  b. 08/2014 Cath: LM nl, LAD 30p, RI 40p, LCX nl, OM1 40, RCA dominant 30p, 70d-->Med Rx.  Marland Kitchen Poor balance   . Presence of permanent cardiac pacemaker   . Sleep apnea    a. cpap  . Vertigo   . WPW (Wolff-Parkinson-White syndrome)    a. S/P RFCA 1991.    Past Surgical History:  Procedure Laterality Date  . ABDOMINAL AORTIC ENDOVASCULAR STENT GRAFT  08/25/2019   Procedure: ABDOMINAL AORTIC ENDOVASCULAR STENT GRAFT;   Surgeon: Algernon Huxley, MD;  Location: [facility] ORS;  Service: Vascular;;  . ANGIOPLASTY Left 08/25/2019   Procedure: ANGIOPLASTY;  Surgeon: Algernon Huxley, MD;  Location: [facility] ORS;  Service: Vascular;  Laterality: Left;  left SFA and stent placement  . APPLICATION OF WOUND VAC Left 06/07/2015   Procedure: APPLICATION OF WOUND VAC;  Surgeon: Robert Bellow, MD;  Location: [facility] ORS;  Service: General;  Laterality: Left;  left upper back  . BACK SURGERY     2011  . CARDIAC CATHETERIZATION  08/26/2014   Single vessel obstructive CAD  . CARPAL TUNNEL RELEASE  04-04-15   [facility]  . CATARACT EXTRACTION  07-31-11 and 09-18-11  . Catheter ablation  1991   for WPW  . cervical fusion    . CHOLECYSTECTOMY  09-07-14  . ENDARTERECTOMY FEMORAL Left 08/25/2019   Procedure: ENDARTERECTOMY FEMORAL;  Surgeon: Algernon Huxley, MD;  Location: [facility] ORS;  Service: Vascular;  Laterality: Left;  common and produndis   . ENDOVASCULAR REPAIR/STENT GRAFT Right 08/25/2019   Procedure: ENDOVASCULAR REPAIR/STENT GRAFT;  Surgeon: Algernon Huxley, MD;  Location: [facility] ORS;  Service: Vascular;  Laterality: Right;  renal artery  .  HAND SURGERY     right 1993; left 2005  . HERNIA REPAIR  1955  . INSERT / REPLACE / REMOVE PACEMAKER    . INSERTION OF ILIAC STENT Bilateral 08/25/2019   Procedure: INSERTION OF ILIAC STENT;  Surgeon: Algernon Huxley, MD;  Location: [facility] ORS;  Service: Vascular;  Laterality: Bilateral;  . JOINT REPLACEMENT Left 2013   knee  . JOINT REPLACEMENT Right 2004   knee  . KNEE SURGERY     left knee 1991 and 1992; right knee 1995  . LEFT HEART CATHETERIZATION WITH CORONARY ANGIOGRAM N/A 08/26/2014   Procedure: LEFT HEART CATHETERIZATION WITH CORONARY ANGIOGRAM;  Surgeon: [name], MD;  Location: [facility] CATH LAB;  Service: Cardiovascular;  Laterality: N/A;  . LOWER EXTREMITY ANGIOGRAPHY Left 08/23/2019   Procedure: Lower Extremity Angiography;  Surgeon: Algernon Huxley, MD;  Location: [place] CV LAB;  Service: Cardiovascular;   Laterality: Left;  . LUMBAR LAMINECTOMY/DECOMPRESSION MICRODISCECTOMY N/A 06/07/2014   Procedure: LUMBAR FOUR TO FIVE LUMBAR LAMINECTOMY/DECOMPRESSION MICRODISCECTOMY 1 LEVEL;  Surgeon: [name], MD;  Location: [place] NEURO ORS;  Service: Neurosurgery;  Laterality: N/A;  . LUNG BIOPSY Right 2016   Dr [name]  . MOHS SURGERY    . PACEMAKER INSERTION     PPM-- St Jude 11/30/10 by [name]  . PPM GENERATOR CHANGEOUT N/A 07/09/2019   Procedure: PPM GENERATOR CHANGEOUT;  Surgeon: Deboraha Sprang, MD;  Location: [place] CV LAB;  Service: Cardiovascular;  Laterality: N/A;  . PROSTATE SURGERY     cancer--1998, prostatectomy  . REPLACEMENT TOTAL KNEE     2004  . ruptured disc     1962 and 1998  . TEE WITHOUT CARDIOVERSION N/A 09/01/2019   Procedure: TRANSESOPHAGEAL ECHOCARDIOGRAM (TEE);  Surgeon: [name], MD;  Location: [facility] ORS;  Service: Cardiovascular;  Laterality: N/A;  . TEMPORARY PACEMAKER N/A 07/09/2019   Procedure: TEMPORARY PACEMAKER;  Surgeon: Deboraha Sprang, MD;  Location: [place] CV LAB;  Service: Cardiovascular;  Laterality: N/A;  . TRIGGER FINGER RELEASE  01-24-15  . WOUND DEBRIDEMENT Left 06/07/2015   Procedure: DEBRIDEMENT WOUND;  Surgeon: Robert Bellow, MD;  Location: [facility] ORS;  Service: General;  Laterality: Left;  left upper back    There were no vitals filed for this visit.   Subjective Assessment - 03/27/21 1423    Subjective Arrives with binder    Currently in Pain? Yes    Pain Score 5     Pain Location Hip    Pain Orientation Right                 ADULT SLP TREATMENT - 03/27/21 1424      General Information   Behavior/Cognition Alert;Cooperative;Pleasant mood    HPI Patient is an 87 y.o. male with past medical history noted for mild cognitive impairment, HTN, CAD, CHF, PAD, DM referred by Dr. Manuella Ghazi for cognitive training. CT head on 03/26/21 revealed generalized parenchymal volume loss slightly increased since the prior study.      Treatment Provided    Treatment provided Cognitive-Linquistic      Pain Assessment   Pain Assessment No/denies pain      Cognitive-Linquistic Treatment   Treatment focused on Cognition;Patient/family/caregiver education    Skilled Treatment SLP worked with pt to ID and begin setting up binder sections to aid with recall and functional tasks. Pt referenced appointment calendar and located upcoming appointments with mod cues. Required min cues to cross off completed days once current date  ID'd. Established memory journal section, with pt requiring moderate verbal cues to organize information appropriately (ie include date, order appropriately, and add relevant details). Pt required occasional redirection to task, self-distracting at times with conversation. With mod complex wordfinding activities, pt generated appropriate responses ~70% accuracy; required cues for attention to details/constraints (number of letters or starting letter). Educated on cognitive activities for home.      Assessment / Recommendations / Plan   Plan Continue with current plan of care      Progression Toward Goals   Progression toward goals Progressing toward goals            SLP Education - 03/27/21 1438    Education Details use of memory binder    Person(s) Educated Patient    Methods Explanation;Demonstration;Handout    Comprehension Verbalized understanding;Verbal cues required;Need further instruction            SLP Short Term Goals - 03/27/21 1447      SLP SHORT TERM GOAL #1   Title Patient will complete high level wordfinding tasks >80% accuracy.    Time 10    Period --   sessions   Status New      SLP SHORT TERM GOAL #2   Title Patient will verbalize daily schedule, recent events and salient information using external memory aids >80% accuracy with moderate cues    Time 10    Period --   sessions   Status New      SLP SHORT TERM GOAL #3   Title Patient will verbalize or write sequence of steps for preferred  computer tasks >80% accuracy with moderate cues    Time 10    Period --   sessions   Status Revised      SLP SHORT TERM GOAL #4   Title Patient will identify errors in simple-mod complex functional finance tasks >75% of the time with min cues for double checking.    Time 10    Period --   sessions   Status New            SLP Long Term Goals - 03/22/21 1501      SLP LONG TERM GOAL #1   Title Patient will verbalize daily schedule, recent events and salient information using external memory aids >90% accuracy with modified independence    Time 12    Period Weeks    Status New      SLP LONG TERM GOAL #2   Title Patient will verbally or written sequence steps in preferred computer tasks >90% accuracy with modified independence (use of external aids if necessary)    Time 12    Period Weeks    Status New      SLP LONG TERM GOAL #3   Title Verbalize and carryover strategies for communicative effectiveness in conversations.    Time 12    Period Weeks    Status New      SLP LONG TERM GOAL #4   Title Pt will report fewer "memory mistakes" as measured by Multifactorial Memory Questionnaire (Ability).    Baseline at eval: t score 39 = "below average"    Time 12    Period Weeks    Status New      SLP LONG TERM GOAL #5   Title Patient will self-correct errors in simple-mod complex functional finance tasks >85% of the time.    Time 12    Period Weeks    Status New  Plan - 03/27/21 1438    Clinical Impression Statement Patient presents with overall mild for age deficits in memory and language per Cognitive Linguistic Quick Test. Patient receptive to memory strategies during initial teaching today; required moderate cues for attention to detail/thought organization. I recommend skilled ST for training in compensations to maximize indepedence, improve wordfinding and ability to recall information, as well as for training in appropriate home program/activities to maintain  cognitive and communication function.    Speech Therapy Frequency 2x / week    Duration 12 weeks    Treatment/Interventions Cueing hierarchy;SLP instruction and feedback;Cognitive reorganization;Functional tasks;Compensatory strategies;Internal/external aids;Multimodal communcation approach;Patient/family education    Potential to Achieve Goals Good    SLP Home Exercise Plan see pt instructions    Consulted and Agree with Plan of Care Patient           Patient will benefit from skilled therapeutic intervention in order to improve the following deficits and impairments:   Cognitive communication deficit    Problem List Patient Active Problem List   Diagnosis Date Noted  . AAA (abdominal aortic aneurysm) without rupture ([place]) 12/10/2019  . Renal artery stenosis ([place]) 12/10/2019  . PAD (peripheral artery disease) ([place]) 12/10/2019  . Thromboembolism ([place]) 08/30/2019  . Atherosclerosis of native arteries of extremity with intermittent claudication ([place]) 08/23/2019  . Swelling of limb 08/17/2019  . Pain in limb 08/17/2019  . NICM (nonischemic cardiomyopathy) ([place]) 07/01/2019  . CHF (congestive heart failure) ([place]) 07/01/2019  . Malignant neoplasm of right lung ([place]) 04/18/2016  . CAD (coronary artery disease) 04/01/2016  . Adenocarcinoma ([place]) 04/01/2016  . Skin cyst 12/26/2015  . GERD (gastroesophageal reflux disease) 06/16/2015  . Abscess of back 06/06/2015  . Vertigo 03/27/2015  . Carpal tunnel syndrome 10/28/2014  . Status post cholecystectomy 09/29/2014  . Disease of digestive tract 09/29/2014  . Cardiomyopathy ([place]) 09/26/2014  . Type 2 diabetes mellitus without complications ([place])   . Sleep apnea   . Spinal stenosis, lumbar region, with neurogenic claudication 06/07/2014  . Lumbar stenosis with neurogenic claudication 06/07/2014  . Abnormal gait 08/20/2012  . H/O total knee replacement 08/20/2012  . Arthritis of knee, degenerative 08/20/2012  . Pacemaker-St.Jude  08/03/2012  . Cardiac conduction disorder 06/19/2012  . Acid reflux 06/18/2012  . 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 Triggering of digit 03/25/2012  . Hyperlipidemia 12/23/2011  . Essential hypertension 03/23/2011  . Atrioventricular block, complete ([place]) 03/23/2011  . Complete atrioventricular block ([place]) 03/23/2011   Deneise Lever, [place], CCC-SLP Speech-Language Pathologist   Aliene Altes 03/27/2021, 2:50 PM  [place] MAIN [facility] SERVICES [address] [place], [place], [address] Phone: [phone]   Fax:  [phone]   Name: TERREZ ANDER MRN: [id number] Date of Birth: 1931-09-14

## 2021-03-30 ENCOUNTER — Ambulatory Visit: Payer: Medicare Other

## 2021-03-30 ENCOUNTER — Other Ambulatory Visit: Payer: Self-pay

## 2021-03-30 DIAGNOSIS — R2689 Other abnormalities of gait and mobility: Secondary | ICD-10-CM

## 2021-03-30 DIAGNOSIS — R262 Difficulty in walking, not elsewhere classified: Secondary | ICD-10-CM

## 2021-03-30 DIAGNOSIS — R2681 Unsteadiness on feet: Secondary | ICD-10-CM | POA: Diagnosis not present

## 2021-03-30 DIAGNOSIS — M6281 Muscle weakness (generalized): Secondary | ICD-10-CM

## 2021-03-30 DIAGNOSIS — R41841 Cognitive communication deficit: Secondary | ICD-10-CM | POA: Diagnosis not present

## 2021-03-30 DIAGNOSIS — R0602 Shortness of breath: Secondary | ICD-10-CM | POA: Diagnosis not present

## 2021-03-30 NOTE — Therapy (Signed)
[place] MAIN [facility] SERVICES [place], [place], [address] Phone: [phone]   Fax:  [phone]  Physical Therapy Treatment  Patient Details  Name: Howard [name] MRN: [id number] Date of Birth: 25-Oct-1931 Referring Provider (PT): Dr. Joselyn Arrow   Encounter Date: 03/30/2021   PT End of Session - 03/30/21 1146    Visit Number 2    Number of Visits 25    Date for PT Re-Evaluation 06/14/21    Authorization Type initial Cert 2/95/1884- 1/66/0630    PT Start Time 1045    PT Stop Time 1134    PT Time Calculation (min) 49 min    Equipment Utilized During Treatment Gait belt    Activity Tolerance Patient tolerated treatment well    Behavior During Therapy [facility] for tasks assessed/performed           Past Medical History:  Diagnosis Date  . Arthritis   . Bell palsy   . Cancer [facility])    prostate and skin  . Chronic combined systolic and diastolic CHF, NYHA class 1 ([place])    a. 07/2014 Echo: EF 35-40%, Gr 1 DD.  Marland Kitchen Complete heart block ([place])    a. 11/2010 s/p SJM 2210 Accent DC PPM, ser# [id number].  . Depression   . Diabetes mellitus without complication ([place])   . Fall 11-10-14  . GERD (gastroesophageal reflux disease)   . History of prostate cancer   . Hyperlipidemia   . Hypertension   . LBBB (left bundle branch block)   . Left-sided Bell's palsy   . Lung cancer ([place]) 2016  . NICM (nonischemic cardiomyopathy) ([place])    a. 07/2014 Echo: EF 35-40%, mid-apicalanteroseptal DK, Gr 1 DD, mild-mod dil LA.  . Non-obstructive CAD    a. 07/2014 Abnl MV;  b. 08/2014 Cath: LM nl, LAD 30p, RI 40p, LCX nl, OM1 40, RCA dominant 30p, 70d-->Med Rx.  Marland Kitchen Poor balance   . Presence of permanent cardiac pacemaker   . Sleep apnea    a. cpap  . Vertigo   . WPW (Wolff-Parkinson-White syndrome)    a. S/P RFCA 1991.    Past Surgical History:  Procedure Laterality Date  . ABDOMINAL AORTIC ENDOVASCULAR STENT GRAFT  08/25/2019   Procedure: ABDOMINAL AORTIC  ENDOVASCULAR STENT GRAFT;  Surgeon: Algernon Huxley, MD;  Location: [facility] ORS;  Service: Vascular;;  . ANGIOPLASTY Left 08/25/2019   Procedure: ANGIOPLASTY;  Surgeon: Algernon Huxley, MD;  Location: [facility] ORS;  Service: Vascular;  Laterality: Left;  left SFA and stent placement  . APPLICATION OF WOUND VAC Left 06/07/2015   Procedure: APPLICATION OF WOUND VAC;  Surgeon: Robert Bellow, MD;  Location: [facility] ORS;  Service: General;  Laterality: Left;  left upper back  . BACK SURGERY     2011  . CARDIAC CATHETERIZATION  08/26/2014   Single vessel obstructive CAD  . CARPAL TUNNEL RELEASE  04-04-15   [facility]  . CATARACT EXTRACTION  07-31-11 and 09-18-11  . Catheter ablation  1991   for WPW  . cervical fusion    . CHOLECYSTECTOMY  09-07-14  . ENDARTERECTOMY FEMORAL Left 08/25/2019   Procedure: ENDARTERECTOMY FEMORAL;  Surgeon: Algernon Huxley, MD;  Location: [facility] ORS;  Service: Vascular;  Laterality: Left;  common and produndis   . ENDOVASCULAR REPAIR/STENT GRAFT Right 08/25/2019   Procedure: ENDOVASCULAR REPAIR/STENT GRAFT;  Surgeon: Algernon Huxley, MD;  Location: [facility] ORS;  Service: Vascular;  Laterality: Right;  renal artery  .  HAND SURGERY     right 1993; left 2005  . HERNIA REPAIR  1955  . INSERT / REPLACE / REMOVE PACEMAKER    . INSERTION OF ILIAC STENT Bilateral 08/25/2019   Procedure: INSERTION OF ILIAC STENT;  Surgeon: Algernon Huxley, MD;  Location: [facility] ORS;  Service: Vascular;  Laterality: Bilateral;  . JOINT REPLACEMENT Left 2013   knee  . JOINT REPLACEMENT Right 2004   knee  . KNEE SURGERY     left knee 1991 and 1992; right knee 1995  . LEFT HEART CATHETERIZATION WITH CORONARY ANGIOGRAM N/A 08/26/2014   Procedure: LEFT HEART CATHETERIZATION WITH CORONARY ANGIOGRAM;  Surgeon: [name], MD;  Location: [facility] CATH LAB;  Service: Cardiovascular;  Laterality: N/A;  . LOWER EXTREMITY ANGIOGRAPHY Left 08/23/2019   Procedure: Lower Extremity Angiography;  Surgeon: Algernon Huxley, MD;  Location: [place] CV LAB;   Service: Cardiovascular;  Laterality: Left;  . LUMBAR LAMINECTOMY/DECOMPRESSION MICRODISCECTOMY N/A 06/07/2014   Procedure: LUMBAR FOUR TO FIVE LUMBAR LAMINECTOMY/DECOMPRESSION MICRODISCECTOMY 1 LEVEL;  Surgeon: [name], MD;  Location: [place] NEURO ORS;  Service: Neurosurgery;  Laterality: N/A;  . LUNG BIOPSY Right 2016   Dr [name]  . MOHS SURGERY    . PACEMAKER INSERTION     PPM-- St Jude 11/30/10 by [name]  . PPM GENERATOR CHANGEOUT N/A 07/09/2019   Procedure: PPM GENERATOR CHANGEOUT;  Surgeon: Deboraha Sprang, MD;  Location: [place] CV LAB;  Service: Cardiovascular;  Laterality: N/A;  . PROSTATE SURGERY     cancer--1998, prostatectomy  . REPLACEMENT TOTAL KNEE     2004  . ruptured disc     1962 and 1998  . TEE WITHOUT CARDIOVERSION N/A 09/01/2019   Procedure: TRANSESOPHAGEAL ECHOCARDIOGRAM (TEE);  Surgeon: [name], MD;  Location: [facility] ORS;  Service: Cardiovascular;  Laterality: N/A;  . TEMPORARY PACEMAKER N/A 07/09/2019   Procedure: TEMPORARY PACEMAKER;  Surgeon: Deboraha Sprang, MD;  Location: [place] CV LAB;  Service: Cardiovascular;  Laterality: N/A;  . TRIGGER FINGER RELEASE  01-24-15  . WOUND DEBRIDEMENT Left 06/07/2015   Procedure: DEBRIDEMENT WOUND;  Surgeon: Robert Bellow, MD;  Location: [facility] ORS;  Service: General;  Laterality: Left;  left upper back    There were no vitals filed for this visit.   Subjective Assessment - 03/30/21 1045    Subjective Pt presents to PT with his spouse. Pt reports no pain at rest. Pt reports back pain gets up to 7/10 when he stands up. Pt says the pain lasts for a couple hours. Tylenol does help with pain.    Patient is accompained by: Family member    Pertinent History Patient is a 87 year old male referred to outpatient PT by Dr. Jennings Books for diagnosis of Imbalance. Patient presents with past medical history of Arthritis, Bell's Palsy, Prostate Cancer, Congestive Heart failure, Heart block, Depression, DM, Falls, GERD, Hypertension,  Coronary artery disease , Pacemaker, Sleep apnea, Vertigo, Wolf- Parkinson white syndrome, Both Left and Right TKA, 5 lumbar surgeries including laminectomy, decompression, and microdisectomy.    Limitations Lifting;Standing;Walking;House hold activities    How long can you sit comfortably? No restrictions.    How long can you stand comfortably? Not too long    How long can you walk comfortably? Less than a block    Patient Stated Goals Patient would like to be able to walk around without the crutch and not fall.    Currently in Pain? No/denies    Pain  Onset More than a month ago          TREATMENT  Therex:  Nustep level 2 (chair at 10)  X 5 min; VC for increasing SPM >60s. Pt reports no back pain with exercise. Close CGA for mount/dismount. At level 2 HR 94 BPM; pt rates level 2 "easy" Level 3 x 1 min; pt rates "easy"  At level 3HR 92 BPM  Stability ball rollouts forward/backwards and side-to-side 10x for each and each direction, CGA;  VC/TC and demo for technique  Seated hip adduction with red ball 1x12, 2x15; VC/TC for LLE increased mm activation; VC/TC and demo for technique  LAQ 2.5# AW1x20, 4# AW 1x20; pt rates "medium," VC/TC/demo for technique.  Seated marches with 4#  2x20; VC/TC/demo for technique  Assessment of pt L foot d/t reports of chronic numbness (his dr aware/not new sx). Pt with minor swelling lateral heel of L foot. Pt also with redness throughout 5th digit of LLE possibly suggestive of increased pressure on digit from shoe. Pt instructed in proper foot care and monitoring d/t his chronic numbness, pressure relief techniques with footwear, and to monitor for worsening sx/development of skin sore/injury and to discuss with dr if it worsens. Pt and his spouse verbalized understanding.   Neuro: CGA-min assist for all,  pt requires demo and VC/explanation for all balance exercises  Standing feet together EO 4x30 sec  Standing feet together EC 3x30 sec   Standing  semi-tandem 2x30 sec for each LE   Pt with multiple instances of decreased postural stability/LOB to R side and posteriorly requiring up to min-assist x1 from PT and intermittent UE support to BUE support to regain balance.      Education provided throughout session for all exercises in the form of VC/TC and demo for correct mm activation and movement at target joints.     PT Education - 03/30/21 1146    Education Details technique with seated therex, body mechanics    Person(s) Educated Patient    Methods Explanation;Demonstration;Verbal cues;Tactile cues    Comprehension Verbalized understanding;Returned demonstration            PT Short Term Goals - 03/22/21 1703      PT SHORT TERM GOAL #1   Title Pt will be independent with HEP in order to improve strength and balance in order to decrease fall risk and improve function at home and work.    Baseline 03/22/2021- Patient has no formal HEP in place.    Time 6    Period Weeks    Status New    Target Date 05/03/21             PT Long Term Goals - 03/22/21 1704      PT LONG TERM GOAL #1   Title Patient will increase FOTO score to equal to  61 or greater  to demonstrate statistically significant improvement in mobility and quality of life.    Baseline 03/22/2021- FOTO SCORE=53    Time 12    Period Weeks    Status New    Target Date 06/14/21      PT LONG TERM GOAL #2   Title Pt will improve BERG by at least 5 points in order to demonstrate clinically significant improvement in balance.    Baseline 03/22/2021- 40/56    Time 12    Period Weeks    Status New    Target Date 06/14/21      PT LONG TERM GOAL #3  Title Pt will decrease 5TSTS by at least 3 seconds in order to demonstrate clinically significant improvement in LE strength.    Baseline 03/22/2021= 16.36 sec without UE support    Time 12    Period Weeks    Status New    Target Date 06/14/21      PT LONG TERM GOAL #4   Title Pt will decrease TUG to below 13  seconds/decrease in order to demonstrate decreased fall risk.    Baseline 03/22/2021= 16.5 sec using front wheeled walker    Time 12    Period Weeks    Status New    Target Date 06/14/21      PT LONG TERM GOAL #5   Title Pt will increase 10MWT by at least 0.13 m/s in order to demonstrate clinically significant improvement in community ambulation.    Baseline 03/22/2021= 0.63 m/s using 1 forearm crutch.    Time 12    Period Weeks    Status New    Target Date 06/14/21                 Plan - 03/30/21 1147    Clinical Impression Statement Pt responds well to all therex performed with no reports of increased back pain. Pt did, however, report pain with final set of semi-tandem balance exercise so pt discontinued exercises and returned to seated therex. PT examined pt L foot as pt reports he has had numbness in foot (not new) since past surgeries and never regained sensation. Pt L 5th digit red, and L lateral heel with mild swelling. Pt reported mild swelling is his baseline, but reports red 5th digit is new. PT instructed pt in monitoring skin of L foot/5th digit d/t numbness to watch for development of sores/wounds and instructed pt in signs and symptoms to watch out for and follow-up with physician if issue progresses. PT also instructed pt in pressure-relief for L LE 5th digit. Pt and his spouse verbalized understanding for all instruction. The pt will benefit from further skilled PT to improve back pain, BLE strength and balance to increase QOL.    Personal Factors and Comorbidities Age;Comorbidity 3+    Comorbidities Multiple spinal surgeries, Arthritis, Diabetes    Examination-Activity Limitations Bend;Caring for Others;Carry;Lift;Squat;Stairs;Stand    Examination-Participation Restrictions Cleaning;Community Activity;Laundry;Yard Work    [employer] Evolving/Moderate complexity    Rehab Potential Good    PT Frequency 2x / week    PT Duration 12 weeks    PT  Treatment/Interventions ADLs/Self Care Home Management;Canalith Repostioning;Cryotherapy;Moist Heat;DME Instruction;Gait training;Stair training;Functional mobility training;Therapeutic activities;Therapeutic exercise;Balance training;Neuromuscular re-education;Patient/family education;Manual techniques;Passive range of motion;Dry needling;Vestibular    PT Next Visit Plan Instruct in beginner balance activities and progressive LE Strengthening. Test DGI and 6 min walk test next 1-2 sessions- add goals as appropriate. Continue with previous plan for next session    PT Home Exercise Plan To be initiated next visit    Consulted and Agree with Plan of Care Patient;Family member/caregiver    Family Member Consulted WIfe- [name]           Patient will benefit from skilled therapeutic intervention in order to improve the following deficits and impairments:  Abnormal gait,Cardiopulmonary status limiting activity,Decreased activity tolerance,Decreased balance,Decreased cognition,Decreased coordination,Decreased endurance,Decreased knowledge of use of DME,Decreased mobility,Decreased strength,Difficulty walking,Impaired sensation,Pain  Visit Diagnosis: Difficulty in walking, not elsewhere classified  Other abnormalities of gait and mobility  Muscle weakness (generalized)  Unsteadiness on feet     Problem List Patient Active Problem  List   Diagnosis Date Noted  . AAA (abdominal aortic aneurysm) without rupture ([place]) 12/10/2019  . Renal artery stenosis ([place]) 12/10/2019  . PAD (peripheral artery disease) ([place]) 12/10/2019  . Thromboembolism ([place]) 08/30/2019  . Atherosclerosis of native arteries of extremity with intermittent claudication ([place]) 08/23/2019  . Swelling of limb 08/17/2019  . Pain in limb 08/17/2019  . NICM (nonischemic cardiomyopathy) ([place]) 07/01/2019  . CHF (congestive heart failure) ([place]) 07/01/2019  . Malignant neoplasm of right lung ([place]) 04/18/2016  . CAD (coronary artery  disease) 04/01/2016  . Adenocarcinoma ([place]) 04/01/2016  . Skin cyst 12/26/2015  . GERD (gastroesophageal reflux disease) 06/16/2015  . Abscess of back 06/06/2015  . Vertigo 03/27/2015  . Carpal tunnel syndrome 10/28/2014  . Status post cholecystectomy 09/29/2014  . Disease of digestive tract 09/29/2014  . Cardiomyopathy ([place]) 09/26/2014  . Type 2 diabetes mellitus without complications ([place])   . Sleep apnea   . Spinal stenosis, lumbar region, with neurogenic claudication 06/07/2014  . Lumbar stenosis with neurogenic claudication 06/07/2014  . Abnormal gait 08/20/2012  . H/O total knee replacement 08/20/2012  . Arthritis of knee, degenerative 08/20/2012  . Pacemaker-St.Jude 08/03/2012  . Cardiac conduction disorder 06/19/2012  . Acid reflux 06/18/2012  . 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 Triggering of digit 03/25/2012  . Hyperlipidemia 12/23/2011  . Essential hypertension 03/23/2011  . Atrioventricular block, complete ([place]) 03/23/2011  . Complete atrioventricular block ([place]) 03/23/2011   Ricard Dillon PT, DPT 03/30/2021, 12:02 PM  [address] MAIN [facility] SERVICES [address] [place], [place], [address] Phone: [phone]   Fax:  [phone]  Name: GIANNY SABINO MRN: [id number] Date of Birth: May 05, 1931

## 2021-04-02 ENCOUNTER — Other Ambulatory Visit: Payer: Self-pay

## 2021-04-02 ENCOUNTER — Ambulatory Visit
Admission: RE | Admit: 2021-04-02 | Discharge: 2021-04-02 | Disposition: A | Discharge status: Home / Self Care | Payer: Medicare Other | Source: Ambulatory Visit | Attending: Radiation Oncology | Admitting: Radiation Oncology

## 2021-04-02 DIAGNOSIS — C3491 Malignant neoplasm of unspecified part of right bronchus or lung: Secondary | ICD-10-CM | POA: Insufficient documentation

## 2021-04-02 DIAGNOSIS — J929 Pleural plaque without asbestos: Secondary | ICD-10-CM | POA: Diagnosis not present

## 2021-04-02 DIAGNOSIS — I2510 Atherosclerotic heart disease of native coronary artery without angina pectoris: Secondary | ICD-10-CM | POA: Diagnosis not present

## 2021-04-02 DIAGNOSIS — C3490 Malignant neoplasm of unspecified part of unspecified bronchus or lung: Secondary | ICD-10-CM | POA: Diagnosis not present

## 2021-04-02 DIAGNOSIS — J432 Centrilobular emphysema: Secondary | ICD-10-CM | POA: Diagnosis not present

## 2021-04-02 MED ORDER — PREDNISONE 20 MG PO TABS: 20.0000 mg | ORAL_TABLET | Freq: Every day | ORAL | 0 refills | Status: DC

## 2021-04-02 MED ORDER — IOHEXOL 300 MG/ML  SOLN
75.0000 mL | Freq: Once | INTRAMUSCULAR | Status: AC | PRN
  Administered 2021-04-02: 60 mL via INTRAVENOUS

## 2021-04-02 MED ORDER — DOXYCYCLINE HYCLATE 100 MG PO TABS: 100.0000 mg | ORAL_TABLET | Freq: Two times a day (BID) | ORAL | 0 refills | Status: DC

## 2021-04-02 NOTE — Telephone Encounter (Addendum)
Dr. Patsey Berthold patient last seen 03/13/2021 for COPD. Pending OV 05/28/2021.  Spoke to patient's spouse, [name](DPR). [name] stated that patient is experiencing increased sob with exertion and at rest, nasal drainage clear in color, chest/nasal congestion,  productive cough with yellow sputum occ mixed with bright red blood. Blood is the size of a pencil eraser. Sx have been present for 2 week.  Denied fever, chills or sweats. Using trelegy and Singulair daily with no relief in sx. Not using albuterol, as he was instructed not to use this medication.    Hx of lung CA--CT 04/02/2021. On 3L QHS. Patient is not monitoring oxygen levels.  Fully vaccinated against covid.   Tammy, please advise. Thanks

## 2021-04-02 NOTE — Telephone Encounter (Signed)
May be developing an early pneumonia.  Please have him check for COVID-19 testing if positive please call us immediately  Begin doxycycline 100 mg twice daily for 7 days Prednisone 20 mg daily for 5 days.  Mucinex DM twice daily as needed for cough and congestion. Continue on oxygen 3 L at bedtime.  If blood-tinged mucus does not stop or increases will need sooner follow-up or emergency room care.  Case discussed with Dr. Patsey Berthold with review of CT chest  Please contact office for sooner follow up if symptoms do not improve or worsen or seek emergency care

## 2021-04-02 NOTE — Telephone Encounter (Signed)
Patient's spouse, [name](DPR) is aware of below recommendations and voiced her understanding.  Rx for doxycycline and prednisone has been sent to preferred pharmacy.m  Patient will contact [facility] for covid test. Nothing further needed at this time.

## 2021-04-03 ENCOUNTER — Other Ambulatory Visit: Payer: Self-pay | Admitting: *Deleted

## 2021-04-03 ENCOUNTER — Ambulatory Visit: Payer: Medicare Other

## 2021-04-03 ENCOUNTER — Ambulatory Visit: Payer: Medicare Other | Admitting: Speech Pathology

## 2021-04-03 DIAGNOSIS — C3411 Malignant neoplasm of upper lobe, right bronchus or lung: Secondary | ICD-10-CM

## 2021-04-05 ENCOUNTER — Other Ambulatory Visit: Payer: Self-pay

## 2021-04-05 ENCOUNTER — Ambulatory Visit (INDEPENDENT_AMBULATORY_CARE_PROVIDER_SITE_OTHER): Payer: Medicare Other

## 2021-04-05 DIAGNOSIS — R0602 Shortness of breath: Secondary | ICD-10-CM | POA: Diagnosis not present

## 2021-04-05 LAB — ECHOCARDIOGRAM COMPLETE
AR max vel: 1.42 cm2
AV Area VTI: 1.58 cm2
AV Area mean vel: 1.38 cm2
AV Mean grad: 2 mmHg
AV Peak grad: 4.8 mmHg
Ao pk vel: 1.1 m/s
Area-P 1/2: 3.4 cm2
MV VTI: 1.35 cm2
S' Lateral: 3.8 cm
Single Plane A4C EF: 42.6 %

## 2021-04-06 ENCOUNTER — Ambulatory Visit (INDEPENDENT_AMBULATORY_CARE_PROVIDER_SITE_OTHER): Payer: Medicare Other

## 2021-04-06 ENCOUNTER — Other Ambulatory Visit: Payer: Self-pay | Admitting: *Deleted

## 2021-04-06 ENCOUNTER — Encounter: Payer: Self-pay | Admitting: Radiation Oncology

## 2021-04-06 ENCOUNTER — Ambulatory Visit
Admission: RE | Admit: 2021-04-06 | Discharge: 2021-04-06 | Disposition: A | Discharge status: Home / Self Care | Payer: Medicare Other | Source: Ambulatory Visit | Attending: Radiation Oncology | Admitting: Radiation Oncology

## 2021-04-06 VITALS — BP 155/80 | HR 82 | Temp 96.2°F | Wt 194.0 lb

## 2021-04-06 DIAGNOSIS — I442 Atrioventricular block, complete: Secondary | ICD-10-CM

## 2021-04-06 DIAGNOSIS — Z923 Personal history of irradiation: Secondary | ICD-10-CM | POA: Diagnosis not present

## 2021-04-06 DIAGNOSIS — C3411 Malignant neoplasm of upper lobe, right bronchus or lung: Secondary | ICD-10-CM

## 2021-04-06 DIAGNOSIS — C3431 Malignant neoplasm of lower lobe, right bronchus or lung: Secondary | ICD-10-CM | POA: Diagnosis not present

## 2021-04-06 NOTE — Progress Notes (Signed)
Radiation Oncology Follow up Note  Name: Howard [name]   Date:   04/06/2021 MRN:  [id number] DOB: 06-May-1931    This 87 y.o. male presents to the clinic today for follow-up status post repeat CT scan in patient 5 years out for SBRT to his right upper lobe tracking new lesion in the right lower lobe for potential malignancy.Marland Kitchen  REFERRING PROVIDER: Chrismon, [name], PA-C  HPI: Patient is an 87 year old male treated 5 years ago to his right upper lobe for non-small cell lung cancer received SBRT.  We have been tracking a lesion in his right lower lobe on serial CT scans.  This has been extremely slow-growing and patient continued to be asymptomatic.  We recently had another CT scan.  Which now demonstrates enlargement to 2.5 x 2.1 cm per previously 1.7 x 1.3.  There is also been interval enlargement of right hilar subcarinal pretracheal and superior right pretracheal lymph nodes.  This may be indicative of new non-small cell lung cancer.  Patient continues to be asymptomatic specifically [place] weight loss fatigue cough hemoptysis or chest tightness.  COMPLICATIONS OF TREATMENT: none  FOLLOW UP COMPLIANCE: keeps appointments   PHYSICAL EXAM:  BP (!) 155/80   Pulse 82   Temp (!) 96.2 F (35.7 C) (Tympanic)   Wt 194 lb (88 kg)   SpO2 96%   BMI 28.65 kg/m  Wheelchair-bound male in NAD.  Well-developed well-nourished patient in NAD. HEENT reveals PERLA, EOMI, discs not visualized.  Oral cavity is clear. No oral mucosal lesions are identified. Neck is clear without evidence of cervical or supraclavicular adenopathy. Lungs are clear to A&P. Cardiac examination is essentially unremarkable with regular rate and rhythm without murmur rub or thrill. Abdomen is benign with no organomegaly or masses noted. Motor sensory and DTR levels are equal and symmetric in the upper and lower extremities. Cranial nerves II through XII are grossly intact. Proprioception is intact. No peripheral adenopathy or edema is  identified. No motor or sensory levels are noted. Crude visual fields are within normal range.  RADIOLOGY RESULTS: CT scans reviewed compatible with above-stated findings.  PET CT scan ordered  PLAN: At this time I have ordered a PET CT scan to better delineate exactly the extent of possible new primary lung cancer.  I am also setting up an appointment with medical oncology after the PET scan and I will follow up the same day.  I have talked to patient's son and they aware of my recommendations and treatment plan at this time.  Family knows to call with any concerns.  I would like to take this opportunity to thank you for allowing me to participate in the care of your patient.Noreene Filbert, MD

## 2021-04-09 ENCOUNTER — Ambulatory Visit (INDEPENDENT_AMBULATORY_CARE_PROVIDER_SITE_OTHER): Payer: Medicare Other | Admitting: Family

## 2021-04-09 ENCOUNTER — Encounter: Payer: Self-pay | Admitting: Family

## 2021-04-09 ENCOUNTER — Other Ambulatory Visit: Payer: Self-pay

## 2021-04-09 ENCOUNTER — Telehealth: Payer: Self-pay

## 2021-04-09 ENCOUNTER — Other Ambulatory Visit
Admission: RE | Admit: 2021-04-09 | Discharge: 2021-04-09 | Disposition: A | Discharge status: Home / Self Care | Payer: Medicare Other | Source: Ambulatory Visit | Attending: Family | Admitting: Family

## 2021-04-09 VITALS — BP 140/70 | HR 74 | Ht 69.0 in | Wt 195.0 lb

## 2021-04-09 DIAGNOSIS — R0600 Dyspnea, unspecified: Secondary | ICD-10-CM

## 2021-04-09 DIAGNOSIS — Z950 Presence of cardiac pacemaker: Secondary | ICD-10-CM

## 2021-04-09 DIAGNOSIS — I5020 Unspecified systolic (congestive) heart failure: Secondary | ICD-10-CM | POA: Diagnosis not present

## 2021-04-09 DIAGNOSIS — I872 Venous insufficiency (chronic) (peripheral): Secondary | ICD-10-CM

## 2021-04-09 DIAGNOSIS — R600 Localized edema: Secondary | ICD-10-CM

## 2021-04-09 DIAGNOSIS — I10 Essential (primary) hypertension: Secondary | ICD-10-CM | POA: Diagnosis not present

## 2021-04-09 LAB — BASIC METABOLIC PANEL
Anion gap: 8 (ref 5–15)
BUN: 28 mg/dL — ABNORMAL HIGH (ref 8–23)
CO2: 23 mmol/L (ref 22–32)
Calcium: 9.1 mg/dL (ref 8.9–10.3)
Chloride: 107 mmol/L (ref 98–111)
Creatinine, Ser: 1.16 mg/dL (ref 0.61–1.24)
GFR, Estimated: >60 mL/min (ref >60)
Glucose, Bld: 142 mg/dL — ABNORMAL HIGH (ref 70–99)
Potassium: 4.3 mmol/L (ref 3.5–5.1)
Sodium: 138 mmol/L (ref 135–145)

## 2021-04-09 LAB — CBC
HCT: 34.1 % — ABNORMAL LOW (ref 39.0–52.0)
Hemoglobin: 10.6 g/dL — ABNORMAL LOW (ref 13.0–17.0)
MCH: 24.3 pg — ABNORMAL LOW (ref 26.0–34.0)
MCHC: 31.1 g/dL (ref 30.0–36.0)
MCV: 78.2 fL — ABNORMAL LOW (ref 80.0–100.0)
Platelets: 355 10*3/uL (ref 150–400)
RBC: 4.36 MIL/uL (ref 4.22–5.81)
RDW: 17.9 % — ABNORMAL HIGH (ref 11.5–15.5)
WBC: 8.7 10*3/uL (ref 4.0–10.5)
nRBC: 0 % (ref 0.0–0.2)

## 2021-04-09 MED ORDER — FUROSEMIDE 20 MG PO TABS: 20.0000 mg | ORAL_TABLET | Freq: Every day | ORAL | 5 refills | Status: DC

## 2021-04-09 NOTE — Patient Instructions (Addendum)
Medication Instructions:  Your physician has recommended you make the following change in your medication:   START Furosemide (Lasix) 20mg  daily in the morning  *If you need a refill on your cardiac medications before your next appointment, please call your pharmacy*   Lab Work: Your physician recommends lab work today: BMP, CBC  Please have your labs drawn at the medical mall. Stop at the registration desk to check in.  If you have labs (blood work) drawn today and your tests are completely normal, you will receive your results only by: Marland Kitchen MyChart Message (if you have MyChart) OR . A paper copy in the mail If you have any lab test that is abnormal or we need to change your treatment, we will call you to review the results.   Testing/Procedures: Your EKG today showed your pacemaker was functioning appropriately.   Your echocardiogram shows your heart pumping function is not as strong as it was.  Follow-Up: At [facility], you and your health needs are our priority.  As part of our continuing mission to provide you with exceptional heart care, we have created designated Provider Care Teams.  These Care Teams include your primary Cardiologist (physician) and Advanced Practice Providers (APPs -  Physician Assistants and Nurse Practitioners) who all work together to provide you with the care you need, when you need it.  We recommend signing up for the patient portal called "MyChart".  Sign up information is provided on this After Visit Summary.  MyChart is used to connect with patients for Virtual Visits (Telemedicine).  Patients are able to view lab/test results, encounter notes, upcoming appointments, etc.  Non-urgent messages can be sent to your provider as well.   To learn more about what you can do with MyChart, go to [email or web address].    Your next appointment:   2 week(s)  The format for your next appointment:   In Person  Provider:   You may see [name], MD or  one of the following Advanced Practice Providers on your designated Care Team:    Murray Hodgkins, NP  Christell Faith, PA-C  Marrianne Mood, PA-C  Cadence Kathlen Mody, [place]  Laurann [place], NP  Other Instructions     [address] [place], [place] [address] Dept: [place]: [place]  04/09/2021  You are scheduled for a Cardiac Catheterization on Friday, April 22 with Dr. [name].  1. Please arrive at the [facility]: at 8:00 AM (This time is one hour before your procedure to ensure your preparation). Free valet parking service is available.   Special note: Every effort is made to have your procedure done on time. Please understand that emergencies sometimes delay scheduled procedures.  2. Diet: Do not eat solid foods after midnight.  The patient may have clear liquids until 5am upon the day of the procedure.  3. Labs: You will need to have blood drawn today. You will need a COVID test on Wed 04/11/21 between 8am-1pm. Report to the medical arts pre-admit testing office. Their office is on the first floor. The first office on the right.  4. Medication instructions in preparation for your procedure:   Contrast Allergy: No   Stop taking Eliquis (Apixiban) on Tuesday, April 19.  Stop taking, Lasix (Furosemide)  Friday, April 22,    Do not take Diabetes Med Glucophage (Metformin) on the day of the procedure and HOLD 48 HOURS  AFTER THE PROCEDURE.  On the morning of your procedure, take your Aspirin and any morning medicines NOT listed above.  You may use sips of water.  5. P lan for one night stay--bring personal belongings. 6. Bring a current list of your medications and current insurance cards. 7. You MUST have a responsible person to drive you home. 8. Someone MUST be with you the first 24 hours after you arrive home or your  discharge will be delayed. 9. Please wear clothes that are easy to get on and off and wear slip-on shoes.  Thank you for allowing [place] to care for you!   -- [place] [facility]

## 2021-04-09 NOTE — H&P (View-Only) (Signed)
Office Visit    Patient Name: Howard [name] Date of Encounter: 04/09/2021  PCP:  Chrismon, Dennis E, [place]  Cardiologist:  [name], MD Advanced Practice Provider:  No care team member to display Electrophysiologist:  [name], MD    Chief Complaint    [name] is a 87 y.o. male with a hx of CHB s/p PPM in 2011, NICM, CAD, lung cancer, PAD, AAA, renal artery stenosis s/p stent presents today for follow-up after echocardiogram  Past Medical History    Past Medical History:  Diagnosis Date  . Arthritis   . Bell palsy   . Cancer [facility])    prostate and skin  . Chronic combined systolic and diastolic CHF, NYHA class 1 ([place])    a. 07/2014 Echo: EF 35-40%, Gr 1 DD.  Marland Kitchen Complete heart block ([place])    a. 11/2010 s/p SJM 2210 Accent DC PPM, ser# [id number].  . Depression   . Diabetes mellitus without complication ([place])   . Fall 11-10-14  . GERD (gastroesophageal reflux disease)   . History of prostate cancer   . Hyperlipidemia   . Hypertension   . LBBB (left bundle branch block)   . Left-sided Bell's palsy   . Lung cancer ([place]) 2016  . NICM (nonischemic cardiomyopathy) ([place])    a. 07/2014 Echo: EF 35-40%, mid-apicalanteroseptal DK, Gr 1 DD, mild-mod dil LA.  . Non-obstructive CAD    a. 07/2014 Abnl MV;  b. 08/2014 Cath: LM nl, LAD 30p, RI 40p, LCX nl, OM1 40, RCA dominant 30p, 70d-->Med Rx.  Marland Kitchen Poor balance   . Presence of permanent cardiac pacemaker   . Sleep apnea    a. cpap  . Vertigo   . WPW (Wolff-Parkinson-White syndrome)    a. S/P RFCA 1991.   Past Surgical History:  Procedure Laterality Date  . ABDOMINAL AORTIC ENDOVASCULAR STENT GRAFT  08/25/2019   Procedure: ABDOMINAL AORTIC ENDOVASCULAR STENT GRAFT;  Surgeon: Algernon Huxley, MD;  Location: [facility] ORS;  Service: Vascular;;  . ANGIOPLASTY Left 08/25/2019   Procedure: ANGIOPLASTY;  Surgeon: Algernon Huxley, MD;  Location: [facility] ORS;  Service: Vascular;  Laterality: Left;  left  SFA and stent placement  . APPLICATION OF WOUND VAC Left 06/07/2015   Procedure: APPLICATION OF WOUND VAC;  Surgeon: Robert Bellow, MD;  Location: [facility] ORS;  Service: General;  Laterality: Left;  left upper back  . BACK SURGERY     2011  . CARDIAC CATHETERIZATION  08/26/2014   Single vessel obstructive CAD  . CARPAL TUNNEL RELEASE  04-04-15   [facility]  . CATARACT EXTRACTION  07-31-11 and 09-18-11  . Catheter ablation  1991   for WPW  . cervical fusion    . CHOLECYSTECTOMY  09-07-14  . ENDARTERECTOMY FEMORAL Left 08/25/2019   Procedure: ENDARTERECTOMY FEMORAL;  Surgeon: Algernon Huxley, MD;  Location: [facility] ORS;  Service: Vascular;  Laterality: Left;  common and produndis   . ENDOVASCULAR REPAIR/STENT GRAFT Right 08/25/2019   Procedure: ENDOVASCULAR REPAIR/STENT GRAFT;  Surgeon: Algernon Huxley, MD;  Location: [facility] ORS;  Service: Vascular;  Laterality: Right;  renal artery  . HAND SURGERY     right 1993; left 2005  . HERNIA REPAIR  1955  . INSERT / REPLACE / REMOVE PACEMAKER    . INSERTION OF ILIAC STENT Bilateral 08/25/2019   Procedure: INSERTION OF ILIAC STENT;  Surgeon: Algernon Huxley, MD;  Location: [facility] ORS;  Service: Vascular;  Laterality: Bilateral;  . JOINT REPLACEMENT Left 2013   knee  . JOINT REPLACEMENT Right 2004   knee  . KNEE SURGERY     left knee 1991 and 1992; right knee 1995  . LEFT HEART CATHETERIZATION WITH CORONARY ANGIOGRAM N/A 08/26/2014   Procedure: LEFT HEART CATHETERIZATION WITH CORONARY ANGIOGRAM;  Surgeon: [name], MD;  Location: [facility] CATH LAB;  Service: Cardiovascular;  Laterality: N/A;  . LOWER EXTREMITY ANGIOGRAPHY Left 08/23/2019   Procedure: Lower Extremity Angiography;  Surgeon: Algernon Huxley, MD;  Location: [place] CV LAB;  Service: Cardiovascular;  Laterality: Left;  . LUMBAR LAMINECTOMY/DECOMPRESSION MICRODISCECTOMY N/A 06/07/2014   Procedure: LUMBAR FOUR TO FIVE LUMBAR LAMINECTOMY/DECOMPRESSION MICRODISCECTOMY 1 LEVEL;  Surgeon: [name], MD;  Location: [place]  NEURO ORS;  Service: Neurosurgery;  Laterality: N/A;  . LUNG BIOPSY Right 2016   Dr [name]  . MOHS SURGERY    . PACEMAKER INSERTION     PPM-- St Jude 11/30/10 by [name]  . PPM GENERATOR CHANGEOUT N/A 07/09/2019   Procedure: PPM GENERATOR CHANGEOUT;  Surgeon: Deboraha Sprang, MD;  Location: [place] CV LAB;  Service: Cardiovascular;  Laterality: N/A;  . PROSTATE SURGERY     cancer--1998, prostatectomy  . REPLACEMENT TOTAL KNEE     2004  . ruptured disc     1962 and 1998  . TEE WITHOUT CARDIOVERSION N/A 09/01/2019   Procedure: TRANSESOPHAGEAL ECHOCARDIOGRAM (TEE);  Surgeon: [name], MD;  Location: [facility] ORS;  Service: Cardiovascular;  Laterality: N/A;  . TEMPORARY PACEMAKER N/A 07/09/2019   Procedure: TEMPORARY PACEMAKER;  Surgeon: Deboraha Sprang, MD;  Location: [place] CV LAB;  Service: Cardiovascular;  Laterality: N/A;  . TRIGGER FINGER RELEASE  01-24-15  . WOUND DEBRIDEMENT Left 06/07/2015   Procedure: DEBRIDEMENT WOUND;  Surgeon: Robert Bellow, MD;  Location: [facility] ORS;  Service: General;  Laterality: Left;  left upper back    Allergies  Allergies  Allergen Reactions  . Sulfa Antibiotics Rash    History of Present Illness    Howard [name] is a 87 y.o. male with a hx of CHB s/p PPM in 2011, NICM, CAD, lung cancer, PAD, AAA, PFO, renal artery stenosis s/p stent.he was last seen 12/19/20 by Dr. [name].  ACE PPM was initially placed on 2011 RepliCare block.  His nonischemic cardiomyopathy dates back to 2015 with echo showing LVEF 35-40%. He received a new pacemaker 06/2019.  He follows with Dr. Caryl Comes of [facility].  He was hospitalized June 2020 with a splenic infarct and has since been treated with anticoagulation at the direction of Dr. Lucky Cowboy of [facility].   There was concern for cardioembolic source and atrial fibrillation at that time but is not shown on device review.  In September 2020 he underwent AAA endovascular stent graft. He previously had PAD intervention including L femoral  endarterectomy, SFA stent, renal artery stent.  Echo 09/01/2019 with LVEF 50-55%, no significant valvular abnormalities, and evidence of atrial shunting with bubble study positive for PFO.  He was seen in clinic April 2021 noting lower extremity edema worse in the day and improved in the morning.  No edema was noted on exam in clinic and he was recommended for conservative measures including elevating lower extremities, low-salt diet, compression stockings.  He was seen by Dr. Caryl Comes November 2021 feeling overall well.  No changes were made.  He contacted his pulmonologist 04/03/2019.  He was recommended for CT chest which showed enlargement of right lower lobe nodule  as well as lymph nodes consistent with enlarging primary lung malignancy and new metastatic disease.  He had echocardiogram 04/11/2021 showing LVEF 20-25%, LV global hypokinesis, mild asymmetric LVH, grade 2 diastolic dysfunction, RV SF moderately reduced, mild MR, aortic valve sclerosis without stenosis.  He was seen by radiation oncology with recommendation for PET scan which is scheduled for later this week.  He presents today for follow-up with his wife. Notes worsening dyspnea on exertion for the last two months.  Also endorses cough which is improved since course of antibiotics, prednisone, Mucinex by pulmonology.  Tells me his dyspnea is worse on exertion. No orthopnea, nor PND. Notes some mild edema in his foot that is unchanged compared to previous.  No chest pain, pressure, tightness.  Endorses generalized fatigue and his wife is very concerned with how much he is sleeping.  EKGs/Labs/Other Studies Reviewed:   The following studies were reviewed today:  CT chest 04/02/2021 IMPRESSION: 1. Interval enlargement of a spiculated nodule of the right lower lobe, measuring 2.5 x 2.1 cm, previously 1.7 x 1.3 cm. 2. Interval enlargement of right hilar, subcarinal, pretracheal, and superior right paratracheal lymph nodes, largest  subcarinal node measuring 4.0 x 2.2 cm, previously 2.8 x 0.9 cm. 3. Findings are consistent with enlarging primary lung malignancy and new nodal metastatic disease. 4. Coronary artery disease.   Aortic Atherosclerosis (ICD10-I70.0) and Emphysema (ICD10-J43.9).  Echo 04/05/2021 1. Left ventricular ejection fraction, by estimation, is 20 to 25%. The  left ventricle has severely decreased function. The left ventricle  demonstrates global hypokinesis. There is mild asymmetric left ventricular  hypertrophy. Left ventricular diastolic   parameters are consistent with Grade II diastolic dysfunction  (pseudonormalization). The average left ventricular global longitudinal  strain is -4.6 %. The global longitudinal strain is abnormal.   2. Right ventricular systolic function is moderately reduced. The right  ventricular size is normal.   3. The mitral valve is degenerative. Mild mitral valve regurgitation.   4. The aortic valve is tricuspid. Aortic valve regurgitation is not  visualized. Mild to moderate aortic valve sclerosis/calcification is  present, without any evidence of aortic stenosis.   5. The inferior vena cava is dilated in size with <50% respiratory  variability, suggesting right atrial pressure of 15 mmHg.   EKG:  EKG is ordered today.  The ekg ordered today demonstrates atrial sensed 60 bpm with prolonged AV conduction.  No acute ST/T wave changes.  Recent Labs: 05/08/2020: TSH 1.890 11/13/2020: ALT 10; BUN 23; Creatinine, Ser 1.24; Hemoglobin 13.2; Platelets 285; Potassium 4.6; Sodium 139  Recent Lipid Panel    Component Value Date/Time   CHOL 130 11/13/2020 0833   TRIG 137 11/13/2020 0833   HDL 43 11/13/2020 0833   CHOLHDL 3.0 11/13/2020 0833   CHOLHDL 3.2 12/10/2019 1044   VLDL 19 12/10/2019 1044   LDLCALC 63 11/13/2020 0833   LDLCALC 63 09/30/2017 1003   Home Medications   Current Meds  Medication Sig  . acetaminophen (TYLENOL) 500 MG tablet Take 1,000 mg by mouth  every 6 (six) hours as needed.  Marland Kitchen albuterol (VENTOLIN HFA) 108 (90 Base) MCG/ACT inhaler Inhale 2 puffs into the lungs every 6 (six) hours as needed for wheezing or shortness of breath.  Marland Kitchen aspirin EC 81 MG tablet Take 81 mg by mouth daily.  . cetirizine (ZYRTEC) 10 MG tablet Take 10 mg by mouth daily.  . Cholecalciferol (VITAMIN D3) 1000 UNITS CAPS Take 1,000 Units by mouth daily.  Marland Kitchen donepezil (ARICEPT) 5  MG tablet Take 5 mg by mouth daily.  Marland Kitchen doxycycline (VIBRA-TABS) 100 MG tablet Take 1 tablet (100 mg total) by mouth 2 (two) times daily.  Marland Kitchen ELIQUIS 5 MG TABS tablet TAKE 1 TABLET TWICE A DAY  (SWITCHED FROM PLAVIX)  . ENTRESTO 24-26 MG TAKE 1 TABLET TWICE A DAY  . ezetimibe (ZETIA) 10 MG tablet TAKE 1 TABLET DAILY  . Fluticasone-Umeclidin-Vilant (TRELEGY ELLIPTA) 100-62.5-25 MCG/INH AEPB Inhale 1 puff into the lungs daily.  . furosemide (LASIX) 20 MG tablet Take 1 tablet (20 mg total) by mouth daily.  . isosorbide mononitrate (IMDUR) 30 MG 24 hr tablet TAKE 1 TABLET DAILY  . metFORMIN (GLUCOPHAGE) 500 MG tablet Take 1 tablet (500 mg total) by mouth 2 (two) times daily with a meal.  . metoprolol succinate (TOPROL-XL) 25 MG 24 hr tablet TAKE 1 TABLET AT BEDTIME  . montelukast (SINGULAIR) 10 MG tablet Take 10 mg by mouth daily as needed (sneezing/allergies.).  Marland Kitchen Multiple Vitamin (MULTIVITAMIN WITH MINERALS) TABS tablet Take 1 tablet by mouth daily. One-A-Day Multivitamin  . omeprazole (PRILOSEC) 40 MG capsule TAKE 1 CAPSULE TWICE DAILY AS NEEDED  . simvastatin (ZOCOR) 40 MG tablet TAKE 1 TABLET AT BEDTIME  . spironolactone (ALDACTONE) 25 MG tablet TAKE 1 TABLET DAILY (DOSE  DECREASE)  . triamcinolone (NASACORT) 55 MCG/ACT AERO nasal inhaler 2 sprays daily.  . [DISCONTINUED] cetirizine (ZYRTEC) 5 MG tablet Take 5 mg by mouth daily.     Review of Systems  All other systems reviewed and are otherwise negative except as noted above.  Physical Exam    VS:  BP 140/70 (BP Location: Right Arm,  Patient Position: Sitting, Cuff Size: Normal)   Pulse 74   Ht 5\' 9"  (1.753 m)   Wt 195 lb (88.5 kg)   SpO2 91%   BMI 28.80 kg/m  , BMI Body mass index is 28.8 kg/m.  Wt Readings from Last 3 Encounters:  04/09/21 195 lb (88.5 kg)  04/06/21 194 lb (88 kg)  03/22/21 195 lb (88.5 kg)    GEN: Well nourished, well developed, in no acute distress. HEENT: normal. Neck: Supple, no JVD, carotid bruits, or masses. Cardiac: RRR, no murmurs, rubs, or gallops. No clubbing, cyanosis.  Trace right pedal edema.  Radials/PT 2+ and equal bilaterally.  Respiratory:  Respirations regular and unlabored, clear to auscultation bilaterally. GI: Soft, nontender, nondistended. MS: No deformity or atrophy. Skin: Warm and dry, no rash. Neuro:  Strength and sensation are intact. Psych: Normal affect.  Assessment & Plan    1. HFrEF / NICM - longstanding history of nonischemic cardiomyopathy.  Echo 08/2019 EF 50 to 55%.  Echo 04/05/2021 LVEF 20 to 25%. Last cardiac catheterization >5 years ago. Plan for right and left cardiac catheterization for assessment of heart pressures and to rule out new ischemia. The risks [stroke (1 in 1000), death (1 in 16), kidney failure [usually temporary] (1 in 500), bleeding (1 in 200), allergic reaction [possibly serious] (1 in 200)], benefits (diagnostic support and management of coronary artery disease) and alternatives of a cardiac catheterization were discussed in detail with Mr. Blumenfeld and he is willing to proceed.  Eliquis will be held 2 days prior to cardiac cath.  GDMT includes metoprolol, Entresto, and spironolactone. Start lasix 20mg  daily. BMP, CBC today. Consider addition SGLT2i vs up-titration of Entresto at followup.   Consider cardiac rehab referral at follow up.   2. Lung cancer- CT chest with increased size RLL nodule and new lymph node involvement. PET  scan 04/12/21 upcoming. Continue to follow with oncology.   3. CHB s/p PPM -Continue to follow with Dr. Caryl Comes of  [facility]  4. HTN -BP mildly elevated today though well controlled on recent clinic visits.  Addition of Lasix, as above.  Continue Entresto 24-26 mg twice daily, Imdur 30 mg daily, Toprol 25 mg QHD, Spironolactone 25mg  daily. . 5. Splenic infarct / Chronic anticoagulation / PFO -.  CBC today.  Continue to follow with vascular surgery. Chronic anticoagulation started during admission for splenic infarct with concern for cardioembolic source and noted PFO by echo.  6. AAA s/p endovascular repair and renal artery stenosis s/p stent -Continue to follow with vascular surgery.  Continue aspirin, statin, Zetia.  Disposition: Follow up in 2 week(s) with Dr. [name] for APP  Signed, Loel Dubonnet, NP 04/09/2021, 9:11 AM [place]

## 2021-04-09 NOTE — Telephone Encounter (Signed)
Unable to reach patient phone kept ringing with no VM. Will try again

## 2021-04-09 NOTE — Progress Notes (Signed)
Office Visit    Patient Name: Howard [name] Date of Encounter: 04/09/2021  PCP:  Chrismon, Dennis E, [place]  Cardiologist:  [name], MD Advanced Practice Provider:  No care team member to display Electrophysiologist:  [name], MD    Chief Complaint    Howard [name] is a 87 y.o. male with a hx of CHB s/p PPM in 2011, NICM, CAD, lung cancer, PAD, AAA, renal artery stenosis s/p stent presents today for follow-up after echocardiogram  Past Medical History    Past Medical History:  Diagnosis Date  . Arthritis   . Bell palsy   . Cancer [facility])    prostate and skin  . Chronic combined systolic and diastolic CHF, NYHA class 1 ([place])    a. 07/2014 Echo: EF 35-40%, Gr 1 DD.  Marland Kitchen Complete heart block ([place])    a. 11/2010 s/p SJM 2210 Accent DC PPM, ser# [id number].  . Depression   . Diabetes mellitus without complication ([place])   . Fall 11-10-14  . GERD (gastroesophageal reflux disease)   . History of prostate cancer   . Hyperlipidemia   . Hypertension   . LBBB (left bundle branch block)   . Left-sided Bell's palsy   . Lung cancer ([place]) 2016  . NICM (nonischemic cardiomyopathy) ([place])    a. 07/2014 Echo: EF 35-40%, mid-apicalanteroseptal DK, Gr 1 DD, mild-mod dil LA.  . Non-obstructive CAD    a. 07/2014 Abnl MV;  b. 08/2014 Cath: LM nl, LAD 30p, RI 40p, LCX nl, OM1 40, RCA dominant 30p, 70d-->Med Rx.  Marland Kitchen Poor balance   . Presence of permanent cardiac pacemaker   . Sleep apnea    a. cpap  . Vertigo   . WPW (Wolff-Parkinson-White syndrome)    a. S/P RFCA 1991.   Past Surgical History:  Procedure Laterality Date  . ABDOMINAL AORTIC ENDOVASCULAR STENT GRAFT  08/25/2019   Procedure: ABDOMINAL AORTIC ENDOVASCULAR STENT GRAFT;  Surgeon: Algernon Huxley, MD;  Location: [facility] ORS;  Service: Vascular;;  . ANGIOPLASTY Left 08/25/2019   Procedure: ANGIOPLASTY;  Surgeon: Algernon Huxley, MD;  Location: [facility] ORS;  Service: Vascular;  Laterality: Left;  left  SFA and stent placement  . APPLICATION OF WOUND VAC Left 06/07/2015   Procedure: APPLICATION OF WOUND VAC;  Surgeon: Robert Bellow, MD;  Location: [facility] ORS;  Service: General;  Laterality: Left;  left upper back  . BACK SURGERY     2011  . CARDIAC CATHETERIZATION  08/26/2014   Single vessel obstructive CAD  . CARPAL TUNNEL RELEASE  04-04-15   [facility]  . CATARACT EXTRACTION  07-31-11 and 09-18-11  . Catheter ablation  1991   for WPW  . cervical fusion    . CHOLECYSTECTOMY  09-07-14  . ENDARTERECTOMY FEMORAL Left 08/25/2019   Procedure: ENDARTERECTOMY FEMORAL;  Surgeon: Algernon Huxley, MD;  Location: [facility] ORS;  Service: Vascular;  Laterality: Left;  common and produndis   . ENDOVASCULAR REPAIR/STENT GRAFT Right 08/25/2019   Procedure: ENDOVASCULAR REPAIR/STENT GRAFT;  Surgeon: Algernon Huxley, MD;  Location: [facility] ORS;  Service: Vascular;  Laterality: Right;  renal artery  . HAND SURGERY     right 1993; left 2005  . HERNIA REPAIR  1955  . INSERT / REPLACE / REMOVE PACEMAKER    . INSERTION OF ILIAC STENT Bilateral 08/25/2019   Procedure: INSERTION OF ILIAC STENT;  Surgeon: Algernon Huxley, MD;  Location: [facility] ORS;  Service: Vascular;  Laterality: Bilateral;  . JOINT REPLACEMENT Left 2013   knee  . JOINT REPLACEMENT Right 2004   knee  . KNEE SURGERY     left knee 1991 and 1992; right knee 1995  . LEFT HEART CATHETERIZATION WITH CORONARY ANGIOGRAM N/A 08/26/2014   Procedure: LEFT HEART CATHETERIZATION WITH CORONARY ANGIOGRAM;  Surgeon: [name], MD;  Location[facility] CATH LAB;  Service: Cardiovascular;  Laterality: N/A;  . LOWER EXTREMITY ANGIOGRAPHY Left 08/23/2019   Procedure: Lower Extremity Angiography;  Surgeon: Algernon Huxley, MD;  Location: [place] CV LAB;  Service: Cardiovascular;  Laterality: Left;  . LUMBAR LAMINECTOMY/DECOMPRESSION MICRODISCECTOMY N/A 06/07/2014   Procedure: LUMBAR FOUR TO FIVE LUMBAR LAMINECTOMY/DECOMPRESSION MICRODISCECTOMY 1 LEVEL;  Surgeon: [name], MD;  Location: [place]  NEURO ORS;  Service: Neurosurgery;  Laterality: N/A;  . LUNG BIOPSY Right 2016   Dr [name]  . MOHS SURGERY    . PACEMAKER INSERTION     PPM-- St Jude 11/30/10 by [name]  . PPM GENERATOR CHANGEOUT N/A 07/09/2019   Procedure: PPM GENERATOR CHANGEOUT;  Surgeon: Deboraha Sprang, MD;  Location: [place] CV LAB;  Service: Cardiovascular;  Laterality: N/A;  . PROSTATE SURGERY     cancer--1998, prostatectomy  . REPLACEMENT TOTAL KNEE     2004  . ruptured disc     1962 and 1998  . TEE WITHOUT CARDIOVERSION N/A 09/01/2019   Procedure: TRANSESOPHAGEAL ECHOCARDIOGRAM (TEE);  Surgeon: [name], MD;  Location: [facility] ORS;  Service: Cardiovascular;  Laterality: N/A;  . TEMPORARY PACEMAKER N/A 07/09/2019   Procedure: TEMPORARY PACEMAKER;  Surgeon: Deboraha Sprang, MD;  Location: [place] CV LAB;  Service: Cardiovascular;  Laterality: N/A;  . TRIGGER FINGER RELEASE  01-24-15  . WOUND DEBRIDEMENT Left 06/07/2015   Procedure: DEBRIDEMENT WOUND;  Surgeon: Robert Bellow, MD;  Location: [facility] ORS;  Service: General;  Laterality: Left;  left upper back    Allergies  Allergies  Allergen Reactions  . Sulfa Antibiotics Rash    History of Present Illness    Howard [name] is a 87 y.o. male with a hx of CHB s/p PPM in 2011, NICM, CAD, lung cancer, PAD, AAA, PFO, renal artery stenosis s/p stent.he was last seen 12/19/20 by Dr. [name].  ACE PPM was initially placed on 2011 RepliCare block.  His nonischemic cardiomyopathy dates back to 2015 with echo showing LVEF 35-40%. He received a new pacemaker 06/2019.  He follows with Dr. Caryl Comes of [facility].  He was hospitalized June 2020 with a splenic infarct and has since been treated with anticoagulation at the direction of Dr. Lucky Cowboy of [facility].   There was concern for cardioembolic source and atrial fibrillation at that time but is not shown on device review.  In September 2020 he underwent AAA endovascular stent graft. He previously had PAD intervention including L femoral  endarterectomy, SFA stent, renal artery stent.  Echo 09/01/2019 with LVEF 50-55%, no significant valvular abnormalities, and evidence of atrial shunting with bubble study positive for PFO.  He was seen in clinic April 2021 noting lower extremity edema worse in the day and improved in the morning.  No edema was noted on exam in clinic and he was recommended for conservative measures including elevating lower extremities, low-salt diet, compression stockings.  He was seen by Dr. Caryl Comes November 2021 feeling overall well.  No changes were made.  He contacted his pulmonologist 04/03/2019.  He was recommended for CT chest which showed enlargement of right lower lobe nodule  as well as lymph nodes consistent with enlarging primary lung malignancy and new metastatic disease.  He had echocardiogram 04/11/2021 showing LVEF 20-25%, LV global hypokinesis, mild asymmetric LVH, grade 2 diastolic dysfunction, RV SF moderately reduced, mild MR, aortic valve sclerosis without stenosis.  He was seen by radiation oncology with recommendation for PET scan which is scheduled for later this week.  He presents today for follow-up with his wife. Notes worsening dyspnea on exertion for the last two months.  Also endorses cough which is improved since course of antibiotics, prednisone, Mucinex by pulmonology.  Tells me his dyspnea is worse on exertion. No orthopnea, nor PND. Notes some mild edema in his foot that is unchanged compared to previous.  No chest pain, pressure, tightness.  Endorses generalized fatigue and his wife is very concerned with how much he is sleeping.  EKGs/Labs/Other Studies Reviewed:   The following studies were reviewed today:  CT chest 04/02/2021 IMPRESSION: 1. Interval enlargement of a spiculated nodule of the right lower lobe, measuring 2.5 x 2.1 cm, previously 1.7 x 1.3 cm. 2. Interval enlargement of right hilar, subcarinal, pretracheal, and superior right paratracheal lymph nodes, largest  subcarinal node measuring 4.0 x 2.2 cm, previously 2.8 x 0.9 cm. 3. Findings are consistent with enlarging primary lung malignancy and new nodal metastatic disease. 4. Coronary artery disease.   Aortic Atherosclerosis (ICD10-I70.0) and Emphysema (ICD10-J43.9).  Echo 04/05/2021 1. Left ventricular ejection fraction, by estimation, is 20 to 25%. The  left ventricle has severely decreased function. The left ventricle  demonstrates global hypokinesis. There is mild asymmetric left ventricular  hypertrophy. Left ventricular diastolic   parameters are consistent with Grade II diastolic dysfunction  (pseudonormalization). The average left ventricular global longitudinal  strain is -4.6 %. The global longitudinal strain is abnormal.   2. Right ventricular systolic function is moderately reduced. The right  ventricular size is normal.   3. The mitral valve is degenerative. Mild mitral valve regurgitation.   4. The aortic valve is tricuspid. Aortic valve regurgitation is not  visualized. Mild to moderate aortic valve sclerosis/calcification is  present, without any evidence of aortic stenosis.   5. The inferior vena cava is dilated in size with <50% respiratory  variability, suggesting right atrial pressure of 15 mmHg.   EKG:  EKG is ordered today.  The ekg ordered today demonstrates atrial sensed 60 bpm with prolonged AV conduction.  No acute ST/T wave changes.  Recent Labs: 05/08/2020: TSH 1.890 11/13/2020: ALT 10; BUN 23; Creatinine, Ser 1.24; Hemoglobin 13.2; Platelets 285; Potassium 4.6; Sodium 139  Recent Lipid Panel    Component Value Date/Time   CHOL 130 11/13/2020 0833   TRIG 137 11/13/2020 0833   HDL 43 11/13/2020 0833   CHOLHDL 3.0 11/13/2020 0833   CHOLHDL 3.2 12/10/2019 1044   VLDL 19 12/10/2019 1044   LDLCALC 63 11/13/2020 0833   LDLCALC 63 09/30/2017 1003   Home Medications   Current Meds  Medication Sig  . acetaminophen (TYLENOL) 500 MG tablet Take 1,000 mg by mouth  every 6 (six) hours as needed.  Marland Kitchen albuterol (VENTOLIN HFA) 108 (90 Base) MCG/ACT inhaler Inhale 2 puffs into the lungs every 6 (six) hours as needed for wheezing or shortness of breath.  Marland Kitchen aspirin EC 81 MG tablet Take 81 mg by mouth daily.  . cetirizine (ZYRTEC) 10 MG tablet Take 10 mg by mouth daily.  . Cholecalciferol (VITAMIN D3) 1000 UNITS CAPS Take 1,000 Units by mouth daily.  Marland Kitchen donepezil (ARICEPT) 5  MG tablet Take 5 mg by mouth daily.  Marland Kitchen doxycycline (VIBRA-TABS) 100 MG tablet Take 1 tablet (100 mg total) by mouth 2 (two) times daily.  Marland Kitchen ELIQUIS 5 MG TABS tablet TAKE 1 TABLET TWICE A DAY  (SWITCHED FROM PLAVIX)  . ENTRESTO 24-26 MG TAKE 1 TABLET TWICE A DAY  . ezetimibe (ZETIA) 10 MG tablet TAKE 1 TABLET DAILY  . Fluticasone-Umeclidin-Vilant (TRELEGY ELLIPTA) 100-62.5-25 MCG/INH AEPB Inhale 1 puff into the lungs daily.  . furosemide (LASIX) 20 MG tablet Take 1 tablet (20 mg total) by mouth daily.  . isosorbide mononitrate (IMDUR) 30 MG 24 hr tablet TAKE 1 TABLET DAILY  . metFORMIN (GLUCOPHAGE) 500 MG tablet Take 1 tablet (500 mg total) by mouth 2 (two) times daily with a meal.  . metoprolol succinate (TOPROL-XL) 25 MG 24 hr tablet TAKE 1 TABLET AT BEDTIME  . montelukast (SINGULAIR) 10 MG tablet Take 10 mg by mouth daily as needed (sneezing/allergies.).  Marland Kitchen Multiple Vitamin (MULTIVITAMIN WITH MINERALS) TABS tablet Take 1 tablet by mouth daily. One-A-Day Multivitamin  . omeprazole (PRILOSEC) 40 MG capsule TAKE 1 CAPSULE TWICE DAILY AS NEEDED  . simvastatin (ZOCOR) 40 MG tablet TAKE 1 TABLET AT BEDTIME  . spironolactone (ALDACTONE) 25 MG tablet TAKE 1 TABLET DAILY (DOSE  DECREASE)  . triamcinolone (NASACORT) 55 MCG/ACT AERO nasal inhaler 2 sprays daily.  . [DISCONTINUED] cetirizine (ZYRTEC) 5 MG tablet Take 5 mg by mouth daily.     Review of Systems  All other systems reviewed and are otherwise negative except as noted above.  Physical Exam    VS:  BP 140/70 (BP Location: Right Arm,  Patient Position: Sitting, Cuff Size: Normal)   Pulse 74   Ht 5\' 9"  (1.753 m)   Wt 195 lb (88.5 kg)   SpO2 91%   BMI 28.80 kg/m  , BMI Body mass index is 28.8 kg/m.  Wt Readings from Last 3 Encounters:  04/09/21 195 lb (88.5 kg)  04/06/21 194 lb (88 kg)  03/22/21 195 lb (88.5 kg)    GEN: Well nourished, well developed, in no acute distress. HEENT: normal. Neck: Supple, no JVD, carotid bruits, or masses. Cardiac: RRR, no murmurs, rubs, or gallops. No clubbing, cyanosis.  Trace right pedal edema.  Radials/PT 2+ and equal bilaterally.  Respiratory:  Respirations regular and unlabored, clear to auscultation bilaterally. GI: Soft, nontender, nondistended. MS: No deformity or atrophy. Skin: Warm and dry, no rash. Neuro:  Strength and sensation are intact. Psych: Normal affect.  Assessment & Plan    1. HFrEF / NICM - longstanding history of nonischemic cardiomyopathy.  Echo 08/2019 EF 50 to 55%.  Echo 04/05/2021 LVEF 20 to 25%. Last cardiac catheterization >5 years ago. Plan for right and left cardiac catheterization for assessment of heart pressures and to rule out new ischemia. The risks [stroke (1 in 1000), death (1 in 24), kidney failure [usually temporary] (1 in 500), bleeding (1 in 200), allergic reaction [possibly serious] (1 in 200)], benefits (diagnostic support and management of coronary artery disease) and alternatives of a cardiac catheterization were discussed in detail with Mr. Niehoff and he is willing to proceed.  Eliquis will be held 2 days prior to cardiac cath.  GDMT includes metoprolol, Entresto, and spironolactone. Start lasix 20mg  daily. BMP, CBC today. Consider addition SGLT2i vs up-titration of Entresto at followup.   Consider cardiac rehab referral at follow up.   2. Lung cancer- CT chest with increased size RLL nodule and new lymph node involvement. PET  scan 04/12/21 upcoming. Continue to follow with oncology.   3. CHB s/p PPM -Continue to follow with Dr. Caryl Comes of  EP  4. HTN -BP mildly elevated today though well controlled on recent clinic visits.  Addition of Lasix, as above.  Continue Entresto 24-26 mg twice daily, Imdur 30 mg daily, Toprol 25 mg QHD, Spironolactone 25mg  daily. . 5. Splenic infarct / Chronic anticoagulation / PFO -.  CBC today.  Continue to follow with vascular surgery. Chronic anticoagulation started during admission for splenic infarct with concern for cardioembolic source and noted PFO by echo.  6. AAA s/p endovascular repair and renal artery stenosis s/p stent -Continue to follow with vascular surgery.  Continue aspirin, statin, Zetia.  Disposition: Follow up in 2 week(s) with Dr. [name] for APP  Signed, Loel Dubonnet, NP 04/09/2021, 9:11 AM [place]

## 2021-04-09 NOTE — Telephone Encounter (Signed)
-----   Message from Lamar Laundry, RN sent at 04/09/2021  9:48 AM EDT ----- Regarding: Remote transmission This patient was seen today by Laurann Montana, NP. Patient wife sts that the patient was scheduled for a remote transmission last week but they were having issues with the machine. Patient ask that someone call him to walk him through sending a manual transmission.

## 2021-04-10 ENCOUNTER — Ambulatory Visit: Payer: Medicare Other | Admitting: Speech Pathology

## 2021-04-10 ENCOUNTER — Ambulatory Visit: Payer: Medicare Other

## 2021-04-10 ENCOUNTER — Telehealth: Payer: Self-pay

## 2021-04-10 NOTE — Telephone Encounter (Signed)
Please advise 

## 2021-04-10 NOTE — Telephone Encounter (Signed)
Copied from [place] [phone]. Topic: General - Other >> Apr 10, 2021  2:41 PM [place], Brayton Layman A wrote: Reason for CRM: Pts wife called to let Howard [name] know that her husband was recently in the ED and found out he is also anemic, caller wanted to know what the next steps should be for the pt. Please advise.

## 2021-04-11 ENCOUNTER — Other Ambulatory Visit
Admission: RE | Admit: 2021-04-11 | Discharge: 2021-04-11 | Disposition: A | Discharge status: Home / Self Care | Payer: Medicare Other | Source: Ambulatory Visit | Attending: Family | Admitting: Family

## 2021-04-11 ENCOUNTER — Other Ambulatory Visit: Payer: Self-pay

## 2021-04-11 DIAGNOSIS — Z01812 Encounter for preprocedural laboratory examination: Secondary | ICD-10-CM | POA: Diagnosis not present

## 2021-04-11 DIAGNOSIS — Z20822 Contact with and (suspected) exposure to covid-19: Secondary | ICD-10-CM | POA: Insufficient documentation

## 2021-04-11 LAB — SARS CORONAVIRUS 2 (TAT 6-24 HRS): SARS Coronavirus 2: NEGATIVE

## 2021-04-12 ENCOUNTER — Ambulatory Visit
Admission: RE | Admit: 2021-04-12 | Discharge: 2021-04-12 | Disposition: A | Discharge status: Home / Self Care | Payer: Medicare Other | Source: Ambulatory Visit | Attending: Radiation Oncology | Admitting: Radiation Oncology

## 2021-04-12 DIAGNOSIS — I700 Atherosclerosis of aorta: Secondary | ICD-10-CM | POA: Insufficient documentation

## 2021-04-12 DIAGNOSIS — C3411 Malignant neoplasm of upper lobe, right bronchus or lung: Secondary | ICD-10-CM | POA: Diagnosis not present

## 2021-04-12 DIAGNOSIS — R911 Solitary pulmonary nodule: Secondary | ICD-10-CM | POA: Diagnosis not present

## 2021-04-12 DIAGNOSIS — I2510 Atherosclerotic heart disease of native coronary artery without angina pectoris: Secondary | ICD-10-CM | POA: Insufficient documentation

## 2021-04-12 DIAGNOSIS — C3490 Malignant neoplasm of unspecified part of unspecified bronchus or lung: Secondary | ICD-10-CM | POA: Diagnosis not present

## 2021-04-12 DIAGNOSIS — R590 Localized enlarged lymph nodes: Secondary | ICD-10-CM | POA: Diagnosis not present

## 2021-04-12 LAB — GLUCOSE, CAPILLARY: Glucose-Capillary: 130 mg/dL — ABNORMAL HIGH (ref 70–99)

## 2021-04-12 MED ORDER — FLUDEOXYGLUCOSE F - 18 (FDG) INJECTION
10.2100 | Freq: Once | INTRAVENOUS | Status: AC | PRN
  Administered 2021-04-12: 10.21 via INTRAVENOUS

## 2021-04-13 ENCOUNTER — Other Ambulatory Visit: Payer: Self-pay | Admitting: *Deleted

## 2021-04-13 ENCOUNTER — Encounter: Payer: Self-pay | Admitting: Cardiovascular Disease

## 2021-04-13 ENCOUNTER — Ambulatory Visit: Payer: Medicare Other

## 2021-04-13 ENCOUNTER — Ambulatory Visit
Admission: RE | Admit: 2021-04-13 | Discharge: 2021-04-13 | Disposition: A | Discharge status: Home / Self Care | Payer: Medicare Other | Attending: Cardiovascular Disease | Admitting: Cardiovascular Disease

## 2021-04-13 ENCOUNTER — Encounter
Admission: RE | Disposition: A | Discharge status: Home / Self Care | Payer: Self-pay | Source: Home / Self Care | Attending: Cardiovascular Disease

## 2021-04-13 DIAGNOSIS — Z923 Personal history of irradiation: Secondary | ICD-10-CM | POA: Diagnosis not present

## 2021-04-13 DIAGNOSIS — E1151 Type 2 diabetes mellitus with diabetic peripheral angiopathy without gangrene: Secondary | ICD-10-CM | POA: Diagnosis not present

## 2021-04-13 DIAGNOSIS — I428 Other cardiomyopathies: Secondary | ICD-10-CM | POA: Diagnosis not present

## 2021-04-13 DIAGNOSIS — I2584 Coronary atherosclerosis due to calcified coronary lesion: Secondary | ICD-10-CM | POA: Insufficient documentation

## 2021-04-13 DIAGNOSIS — Z8774 Personal history of (corrected) congenital malformations of heart and circulatory system: Secondary | ICD-10-CM | POA: Diagnosis not present

## 2021-04-13 DIAGNOSIS — I2510 Atherosclerotic heart disease of native coronary artery without angina pectoris: Secondary | ICD-10-CM

## 2021-04-13 DIAGNOSIS — Z7984 Long term (current) use of oral hypoglycemic drugs: Secondary | ICD-10-CM | POA: Insufficient documentation

## 2021-04-13 DIAGNOSIS — I5020 Unspecified systolic (congestive) heart failure: Secondary | ICD-10-CM | POA: Diagnosis not present

## 2021-04-13 DIAGNOSIS — I442 Atrioventricular block, complete: Secondary | ICD-10-CM | POA: Diagnosis not present

## 2021-04-13 DIAGNOSIS — D735 Infarction of spleen: Secondary | ICD-10-CM | POA: Diagnosis not present

## 2021-04-13 DIAGNOSIS — C3490 Malignant neoplasm of unspecified part of unspecified bronchus or lung: Secondary | ICD-10-CM | POA: Diagnosis not present

## 2021-04-13 DIAGNOSIS — I5042 Chronic combined systolic (congestive) and diastolic (congestive) heart failure: Secondary | ICD-10-CM | POA: Insufficient documentation

## 2021-04-13 DIAGNOSIS — Z9049 Acquired absence of other specified parts of digestive tract: Secondary | ICD-10-CM | POA: Diagnosis not present

## 2021-04-13 DIAGNOSIS — I272 Pulmonary hypertension, unspecified: Secondary | ICD-10-CM | POA: Insufficient documentation

## 2021-04-13 DIAGNOSIS — Z79899 Other long term (current) drug therapy: Secondary | ICD-10-CM | POA: Diagnosis not present

## 2021-04-13 DIAGNOSIS — Z882 Allergy status to sulfonamides status: Secondary | ICD-10-CM | POA: Insufficient documentation

## 2021-04-13 DIAGNOSIS — D649 Anemia, unspecified: Secondary | ICD-10-CM

## 2021-04-13 DIAGNOSIS — I110 Hypertensive heart disease with heart failure: Secondary | ICD-10-CM | POA: Insufficient documentation

## 2021-04-13 DIAGNOSIS — Z7901 Long term (current) use of anticoagulants: Secondary | ICD-10-CM | POA: Diagnosis not present

## 2021-04-13 DIAGNOSIS — Z950 Presence of cardiac pacemaker: Secondary | ICD-10-CM | POA: Diagnosis not present

## 2021-04-13 DIAGNOSIS — R0600 Dyspnea, unspecified: Secondary | ICD-10-CM

## 2021-04-13 HISTORY — PX: RIGHT/LEFT HEART CATH AND CORONARY ANGIOGRAPHY: CATH118266

## 2021-04-13 LAB — GLUCOSE, CAPILLARY: Glucose-Capillary: 163 mg/dL — ABNORMAL HIGH (ref 70–99)

## 2021-04-13 SURGERY — RIGHT/LEFT HEART CATH AND CORONARY ANGIOGRAPHY
Anesthesia: Moderate Sedation | Laterality: Bilateral

## 2021-04-13 MED ORDER — IOHEXOL 300 MG/ML  SOLN
INTRAMUSCULAR | Status: DC | PRN
  Administered 2021-04-13: 46 mL

## 2021-04-13 MED ORDER — SODIUM CHLORIDE 0.9% FLUSH: 3.0000 mL | Freq: Two times a day (BID) | INTRAVENOUS | Status: DC

## 2021-04-13 MED ORDER — ASPIRIN 81 MG PO CHEW
81.0000 mg | CHEWABLE_TABLET | ORAL | Status: DC
Start: 2021-04-14 — End: 2021-04-13

## 2021-04-13 MED ORDER — MIDAZOLAM HCL 2 MG/2ML IJ SOLN
INTRAMUSCULAR | Status: AC
  Filled 2021-04-13: qty 2

## 2021-04-13 MED ORDER — HEPARIN (PORCINE) IN NACL 1000-0.9 UT/500ML-% IV SOLN
INTRAVENOUS | Status: AC
  Filled 2021-04-13: qty 1000

## 2021-04-13 MED ORDER — SODIUM CHLORIDE 0.9 % IV SOLN: 250.0000 mL | INTRAVENOUS | Status: DC | PRN

## 2021-04-13 MED ORDER — SODIUM CHLORIDE 0.9% FLUSH: 3.0000 mL | INTRAVENOUS | Status: DC | PRN

## 2021-04-13 MED ORDER — VERAPAMIL HCL 2.5 MG/ML IV SOLN
INTRAVENOUS | Status: AC
  Filled 2021-04-13: qty 2

## 2021-04-13 MED ORDER — SODIUM CHLORIDE 0.9 % IV SOLN: INTRAVENOUS | Status: DC

## 2021-04-13 MED ORDER — FENTANYL CITRATE (PF) 100 MCG/2ML IJ SOLN
INTRAMUSCULAR | Status: AC
  Filled 2021-04-13: qty 2

## 2021-04-13 MED ORDER — MIDAZOLAM HCL 2 MG/2ML IJ SOLN
INTRAMUSCULAR | Status: DC | PRN
  Administered 2021-04-13: 1 mg via INTRAVENOUS

## 2021-04-13 MED ORDER — HEPARIN SODIUM (PORCINE) 1000 UNIT/ML IJ SOLN
INTRAMUSCULAR | Status: AC
  Filled 2021-04-13: qty 1

## 2021-04-13 MED ORDER — FENTANYL CITRATE (PF) 100 MCG/2ML IJ SOLN
INTRAMUSCULAR | Status: DC | PRN
  Administered 2021-04-13: 25 ug via INTRAVENOUS

## 2021-04-13 MED ORDER — HEPARIN SODIUM (PORCINE) 1000 UNIT/ML IJ SOLN
INTRAMUSCULAR | Status: DC | PRN
  Administered 2021-04-13: 4500 [IU] via INTRAVENOUS

## 2021-04-13 MED ORDER — HEPARIN (PORCINE) IN NACL 2000-0.9 UNIT/L-% IV SOLN
INTRAVENOUS | Status: DC | PRN
  Administered 2021-04-13: 1000 mL

## 2021-04-13 MED ORDER — VERAPAMIL HCL 2.5 MG/ML IV SOLN
INTRAVENOUS | Status: DC | PRN
  Administered 2021-04-13: 2.5 mg via INTRA_ARTERIAL

## 2021-04-13 SURGICAL SUPPLY — 13 items
CATH 5F 110X4 TIG (CATHETERS) ×2 IMPLANT
CATH SWAN GANZ 7F STRAIGHT (CATHETERS) ×2 IMPLANT
DEVICE RAD TR BAND REGULAR (VASCULAR PRODUCTS) ×2 IMPLANT
DRAPE BRACHIAL (DRAPES) ×4 IMPLANT
GLIDESHEATH SLEND SS 6F .021 (SHEATH) ×2 IMPLANT
GLIDESHEATH SLENDER 7FR .021G (SHEATH) ×2 IMPLANT
GUIDEWIRE .025 260CM (WIRE) ×2 IMPLANT
GUIDEWIRE INQWIRE 1.5J.035X260 (WIRE) ×1 IMPLANT
INQWIRE 1.5J .035X260CM (WIRE) ×2
PACK CARDIAC CATH (CUSTOM PROCEDURE TRAY) ×2 IMPLANT
PROTECTION STATION PRESSURIZED (MISCELLANEOUS) ×2
SET ATX SIMPLICITY (MISCELLANEOUS) ×2 IMPLANT
STATION PROTECTION PRESSURIZED (MISCELLANEOUS) ×1 IMPLANT

## 2021-04-13 NOTE — Discharge Instructions (Signed)
Radial Site Care  This sheet gives you information about how to care for yourself after your procedure. Your health care provider may also give you more specific instructions. If you have problems or questions, contact your health care provider. What can I expect after the procedure? After the procedure, it is common to have:  Bruising and tenderness at the catheter insertion area. Follow these instructions at home: Medicines  Take over-the-counter and prescription medicines only as told by your health care provider. Insertion site care  Follow instructions from your health care provider about how to take care of your insertion site. Make sure you: ? Wash your hands with soap and water before you change your bandage (dressing). If soap and water are not available, use hand sanitizer. ? Change your dressing as told by your health care provider. ? Leave stitches (sutures), skin glue, or adhesive strips in place. These skin closures may need to stay in place for 2 weeks or longer. If adhesive strip edges start to loosen and curl up, you may trim the loose edges. Do not remove adhesive strips completely unless your health care provider tells you to do that.  Check your insertion site every day for signs of infection. Check for: ? Redness, swelling, or pain. ? Fluid or blood. ? Pus or a bad smell. ? Warmth.  Do not take baths, swim, or use a hot tub until your health care provider approves.  You may shower 24-48 hours after the procedure, or as directed by your health care provider. ? Remove the dressing and gently wash the site with plain soap and water. ? Pat the area dry with a clean towel. ? Do not rub the site. That could cause bleeding.  Do not apply powder or lotion to the site. Activity  For 24 hours after the procedure, or as directed by your health care provider: ? Do not flex or bend the affected arm. ? Do not push or pull heavy objects with the affected arm. ? Do not drive  yourself home from the hospital or clinic. You may drive 24 hours after the procedure unless your health care provider tells you not to. ? Do not operate machinery or power tools.  Do not lift anything that is heavier than 10 lb (4.5 kg), or the limit that you are told, until your health care provider says that it is safe.  Ask your health care provider when it is okay to: ? Return to work or school. ? Resume usual physical activities or sports. ? Resume sexual activity.   General instructions  If the catheter site starts to bleed, raise your arm and put firm pressure on the site. If the bleeding does not stop, get help right away. This is a medical emergency.  If you went home on the same day as your procedure, a responsible adult should be with you for the first 24 hours after you arrive home.  Keep all follow-up visits as told by your health care provider. This is important. Contact a health care provider if:  You have a fever.  You have redness, swelling, or yellow drainage around your insertion site. Get help right away if:  You have unusual pain at the radial site.  The catheter insertion area swells very fast.  The insertion area is bleeding, and the bleeding does not stop when you hold steady pressure on the area.  Your arm or hand becomes pale, cool, tingly, or numb. These symptoms may represent a serious   problem that is an emergency. Do not wait to see if the symptoms will go away. Get medical help right away. Call your local emergency services (911 in the [place] Do not drive yourself to the hospital. Summary  After the procedure, it is common to have bruising and tenderness at the site.  Follow instructions from your health care provider about how to take care of your radial site wound. Check the wound every day for signs of infection.  Do not lift anything that is heavier than 10 lb (4.5 kg), or the limit that you are told, until your health care provider says that it  is safe. This information is not intended to replace advice given to you by your health care provider. Make sure you discuss any questions you have with your health care provider. Document Revised: 01/14/2018 Document Reviewed: 01/14/2018 Elsevier Patient Education  2021 [employer].  

## 2021-04-13 NOTE — Telephone Encounter (Signed)
Draw CBC and iron panel next week.  See [name] a couple weeks from now.

## 2021-04-13 NOTE — Telephone Encounter (Signed)
Labs ordered. LMOVM for pt's wife to return call.

## 2021-04-13 NOTE — Progress Notes (Signed)
Howard [name] was advised.

## 2021-04-13 NOTE — Interval H&P Note (Signed)
History and Physical Interval Note:  04/13/2021 11:16 AM  Howard [name]  has presented today for surgery, with the diagnosis of RT and LT Cath   Heart failure w reduced EF.  The various methods of treatment have been discussed with the patient and family. After consideration of risks, benefits and other options for treatment, the patient has consented to  Procedure(s): RIGHT/LEFT HEART CATH AND CORONARY ANGIOGRAPHY (Bilateral) as a surgical intervention.  The patient's history has been reviewed, patient examined, no change in status, stable for surgery.  I have reviewed the patient's chart and labs.  Questions were answered to the patient's satisfaction.     Kathlyn [place]

## 2021-04-16 ENCOUNTER — Encounter: Payer: Self-pay | Admitting: Oncology

## 2021-04-16 ENCOUNTER — Ambulatory Visit: Admission: RE | Admit: 2021-04-16 | Payer: Medicare Other | Source: Ambulatory Visit | Admitting: Radiation Oncology

## 2021-04-16 ENCOUNTER — Inpatient Hospital Stay: Payer: Medicare Other

## 2021-04-16 ENCOUNTER — Inpatient Hospital Stay: Payer: Medicare Other | Attending: Oncology | Admitting: Oncology

## 2021-04-16 VITALS — BP 127/68 | HR 69 | Temp 98.6°F | Resp 18 | Wt 193.9 lb

## 2021-04-16 DIAGNOSIS — R590 Localized enlarged lymph nodes: Secondary | ICD-10-CM | POA: Insufficient documentation

## 2021-04-16 DIAGNOSIS — I2510 Atherosclerotic heart disease of native coronary artery without angina pectoris: Secondary | ICD-10-CM | POA: Diagnosis not present

## 2021-04-16 DIAGNOSIS — R918 Other nonspecific abnormal finding of lung field: Secondary | ICD-10-CM | POA: Insufficient documentation

## 2021-04-16 DIAGNOSIS — Z87891 Personal history of nicotine dependence: Secondary | ICD-10-CM | POA: Insufficient documentation

## 2021-04-16 DIAGNOSIS — Z7189 Other specified counseling: Secondary | ICD-10-CM | POA: Insufficient documentation

## 2021-04-16 DIAGNOSIS — G4733 Obstructive sleep apnea (adult) (pediatric): Secondary | ICD-10-CM | POA: Diagnosis not present

## 2021-04-16 DIAGNOSIS — R5383 Other fatigue: Secondary | ICD-10-CM | POA: Diagnosis not present

## 2021-04-16 DIAGNOSIS — I428 Other cardiomyopathies: Secondary | ICD-10-CM | POA: Diagnosis not present

## 2021-04-16 DIAGNOSIS — I10 Essential (primary) hypertension: Secondary | ICD-10-CM | POA: Diagnosis not present

## 2021-04-16 DIAGNOSIS — D509 Iron deficiency anemia, unspecified: Secondary | ICD-10-CM | POA: Diagnosis not present

## 2021-04-16 DIAGNOSIS — G3184 Mild cognitive impairment, so stated: Secondary | ICD-10-CM | POA: Diagnosis not present

## 2021-04-16 DIAGNOSIS — I700 Atherosclerosis of aorta: Secondary | ICD-10-CM | POA: Insufficient documentation

## 2021-04-16 DIAGNOSIS — R0602 Shortness of breath: Secondary | ICD-10-CM | POA: Insufficient documentation

## 2021-04-16 DIAGNOSIS — J449 Chronic obstructive pulmonary disease, unspecified: Secondary | ICD-10-CM | POA: Insufficient documentation

## 2021-04-16 DIAGNOSIS — C3411 Malignant neoplasm of upper lobe, right bronchus or lung: Secondary | ICD-10-CM

## 2021-04-16 DIAGNOSIS — Z8249 Family history of ischemic heart disease and other diseases of the circulatory system: Secondary | ICD-10-CM | POA: Diagnosis not present

## 2021-04-16 DIAGNOSIS — Z8349 Family history of other endocrine, nutritional and metabolic diseases: Secondary | ICD-10-CM | POA: Diagnosis not present

## 2021-04-16 DIAGNOSIS — I5022 Chronic systolic (congestive) heart failure: Secondary | ICD-10-CM | POA: Insufficient documentation

## 2021-04-16 DIAGNOSIS — Z79899 Other long term (current) drug therapy: Secondary | ICD-10-CM | POA: Diagnosis not present

## 2021-04-16 DIAGNOSIS — I371 Nonrheumatic pulmonary valve insufficiency: Secondary | ICD-10-CM | POA: Insufficient documentation

## 2021-04-16 LAB — CUP PACEART REMOTE DEVICE CHECK
Battery Remaining Longevity: 110 mo
Battery Remaining Percentage: 95.5 %
Battery Voltage: 3.01 V
Brady Statistic AP VP Percent: 1 %
Brady Statistic AP VS Percent: 1 %
Brady Statistic AS VP Percent: 99 %
Brady Statistic AS VS Percent: 1 %
Brady Statistic RA Percent Paced: 1 %
Brady Statistic RV Percent Paced: 99 %
Date Time Interrogation Session: 20220415020014
Implantable Lead Implant Date: 20111208
Implantable Lead Implant Date: 20111208
Implantable Lead Location: 753859
Implantable Lead Location: 753860
Implantable Lead Model: 1948
Implantable Pulse Generator Implant Date: 20200717
Lead Channel Impedance Value: 350 Ohm
Lead Channel Impedance Value: 400 Ohm
Lead Channel Pacing Threshold Amplitude: 0.75 V
Lead Channel Pacing Threshold Amplitude: 1.375 V
Lead Channel Pacing Threshold Pulse Width: 0.5 ms
Lead Channel Pacing Threshold Pulse Width: 0.5 ms
Lead Channel Sensing Intrinsic Amplitude: 12 mV
Lead Channel Sensing Intrinsic Amplitude: 3.6 mV
Lead Channel Setting Pacing Amplitude: 1.625
Lead Channel Setting Pacing Amplitude: 2 V
Lead Channel Setting Pacing Pulse Width: 0.5 ms
Lead Channel Setting Sensing Sensitivity: 4 mV
Pulse Gen Model: 2272
Pulse Gen Serial Number: 9150507

## 2021-04-16 LAB — HEPATIC FUNCTION PANEL
ALT: 13 U/L (ref 0–44)
AST: 22 U/L (ref 15–41)
Albumin: 3.5 g/dL (ref 3.5–5.0)
Alkaline Phosphatase: 48 U/L (ref 38–126)
Bilirubin, Direct: 0.1 mg/dL (ref 0.0–0.2)
Indirect Bilirubin: 0.7 mg/dL (ref 0.3–0.9)
Total Bilirubin: 0.8 mg/dL (ref 0.3–1.2)
Total Protein: 6.9 g/dL (ref 6.5–8.1)

## 2021-04-16 LAB — IRON AND TIBC
Iron: 33 ug/dL — ABNORMAL LOW (ref 45–182)
Saturation Ratios: 10 % — ABNORMAL LOW (ref 17.9–39.5)
TIBC: 346 ug/dL (ref 250–450)
UIBC: 313 ug/dL

## 2021-04-16 LAB — FERRITIN: Ferritin: 35 ng/mL (ref 24–336)

## 2021-04-16 MED ORDER — VITRON-C 65-125 MG PO TABS: 1.0000 | ORAL_TABLET | Freq: Every day | ORAL | 2 refills | Status: AC

## 2021-04-16 NOTE — Progress Notes (Signed)
New patient evaluation.   

## 2021-04-16 NOTE — Progress Notes (Signed)
Hematology/Oncology Consult note [facility] [phone] [phone]   Patient Care Team: Chrismon, [name], PA-C as PCP - General (Family Medicine) [name], MD as PCP - Cardiology (Cardiology) Deboraha Sprang, MD as PCP - Electrophysiology (Cardiology) Dasher, [name], MD as Consulting Physician (Dermatology) Birder Robson, MD as Referring Physician (Ophthalmology) Lucky Cowboy Erskine Squibb, MD as Referring Physician (Vascular Surgery) Carloyn Manner, MD as Referring Physician (Otolaryngology) Noreene Filbert, MD as Referring Physician (Radiation Oncology) Erby Pian, MD as Referring Physician (Specialist)  REFERRING PROVIDER: Margo Common, PA-C  CHIEF COMPLAINTS/REASON FOR VISIT:  Evaluation of lung cancer  HISTORY OF PRESENTING ILLNESS:   ZEBADIAH WILLERT is a  87 y.o.  male with PMH listed below was seen in consultation at the request of  Chrismon, [name], PA-C  for evaluation of lung cancer Patient was accompanied by his son. 10/07/2015 CT chest without contrast showed suspicious anterior right upper lobe spiculated nodule.  1.3 cm 10/26/2015, right upper lobe spiculated lung mass biopsy-adenocarcinoma with acinar pattern. December 2016,-SBRT to the right upper lobe Patient follows up with Dr. Baruch Gouty for post monitoring CT surveillance. 06/05/2020, CT chest with contrast showed enlarging irregular solid right lower lobe 1.6 cm pulmonary nodule.  Worrisome for malignancy. 06/19/2020, PET scan showed pleural-based nodule with central calcification noted to be enlarging at the recent CT and the maximum of SUV of 2.7.  Low-grade malignancy is difficult to exclude. 09/27/2020, CT chest with contrast showed no significant changes in the appearance of the solid right lower lobe pulmonary lesion. 04/02/2021, CT chest with contrast showed interval enlargement of the spiculated nodule of the right lower lobe, 2.5 x 2.1 cm, previously  1.7 x 1.3 cm.  Interval enlargement of right hilar, subcarinal, pretracheal and S2.  Right paratracheal lymph nodes.  Largest subcarinal node measuring 4 x 2.2 cm coronary artery disease 04/12/2021, 2.7 x 2.0 cm hypermetabolic right lower lobe pulmonary nodule, with bilateral mediastinal lymphadenopathy.  No definitive signs of remote metastatic disease.  Concerning for primary bronchogenic carcinoma.  T1c N3 M0.-Aortic atherosclerosis injection to left main/three-vessel coronary artery disease.  Calcification of aortic valve and mitral valve/annulus.  Patient was referred by Dr. Baruch Gouty to establish care with oncology. Patient lives at home with his wife.  He walks with a cane. He sees pulmonology Dr. Patsey Berthold for COPD, OSA on CPAP. Appetite is fair.  No intentional weight loss  chronic shortness of breath with exertion  Review of Systems  Constitutional: Positive for fatigue. Negative for appetite change, chills, fever and unexpected weight change.  HENT:   Negative for hearing loss and voice change.   Eyes: Negative for eye problems and icterus.  Respiratory: Positive for shortness of breath. Negative for chest tightness and cough.   Cardiovascular: Negative for chest pain and leg swelling.  Gastrointestinal: Negative for abdominal distention and abdominal pain.  Endocrine: Negative for hot flashes.  Genitourinary: Negative for difficulty urinating, dysuria and frequency.   Musculoskeletal: Negative for arthralgias.  Skin: Negative for itching and rash.  Neurological: Negative for light-headedness and numbness.  Hematological: Negative for adenopathy. Does not bruise/bleed easily.  Psychiatric/Behavioral: Negative for confusion.    MEDICAL HISTORY:  Past Medical History:  Diagnosis Date  . Arthritis   . Bell palsy   . Bell's palsy 04/12/2015  . Cancer [facility])    prostate and skin  . Chronic combined systolic and diastolic CHF, NYHA class 1 ([place])    a. 07/2014 Echo: EF 35-40%, Gr  1  DD.  . Complete heart block ([place])    a. 11/2010 s/p SJM 2210 [employer] DC PPM, ser# [id number].  . Depression   . Diabetes mellitus without complication ([place])   . Fall 11-10-14  . GERD (gastroesophageal reflux disease)   . History of prostate cancer   . Hyperlipidemia   . Hypertension   . LBBB (left bundle branch block)   . Left-sided Bell's palsy   . Lung cancer ([place]) 2016  . NICM (nonischemic cardiomyopathy) ([place])    a. 07/2014 Echo: EF 35-40%, mid-apicalanteroseptal DK, Gr 1 DD, mild-mod dil LA.  . Non-obstructive CAD    a. 07/2014 Abnl MV;  b. 08/2014 Cath: LM nl, LAD 30p, RI 40p, LCX nl, OM1 40, RCA dominant 30p, 70d-->Med Rx.  Marland Kitchen Poor balance   . Presence of permanent cardiac pacemaker   . Sleep apnea    a. cpap  . Vertigo   . WPW (Wolff-Parkinson-White syndrome)    a. S/P RFCA 1991.    SURGICAL HISTORY: Past Surgical History:  Procedure Laterality Date  . ABDOMINAL AORTIC ENDOVASCULAR STENT GRAFT  08/25/2019   Procedure: ABDOMINAL AORTIC ENDOVASCULAR STENT GRAFT;  Surgeon: Algernon Huxley, MD;  Location: [facility] ORS;  Service: Vascular;;  . ANGIOPLASTY Left 08/25/2019   Procedure: ANGIOPLASTY;  Surgeon: Algernon Huxley, MD;  Location: [facility] ORS;  Service: Vascular;  Laterality: Left;  left SFA and stent placement  . APPLICATION OF WOUND VAC Left 06/07/2015   Procedure: APPLICATION OF WOUND VAC;  Surgeon: Robert Bellow, MD;  Location: [facility] ORS;  Service: General;  Laterality: Left;  left upper back  . BACK SURGERY     2011  . CARDIAC CATHETERIZATION  08/26/2014   Single vessel obstructive CAD  . CARPAL TUNNEL RELEASE  04-04-15   [facility]  . CATARACT EXTRACTION  07-31-11 and 09-18-11  . Catheter ablation  1991   for WPW  . cervical fusion    . CHOLECYSTECTOMY  09-07-14  . ENDARTERECTOMY FEMORAL Left 08/25/2019   Procedure: ENDARTERECTOMY FEMORAL;  Surgeon: Algernon Huxley, MD;  Location: [facility] ORS;  Service: Vascular;  Laterality: Left;  common and produndis   . ENDOVASCULAR REPAIR/STENT GRAFT Right  08/25/2019   Procedure: ENDOVASCULAR REPAIR/STENT GRAFT;  Surgeon: Algernon Huxley, MD;  Location: [facility] ORS;  Service: Vascular;  Laterality: Right;  renal artery  . HAND SURGERY     right 1993; left 2005  . HERNIA REPAIR  1955  . INSERT / REPLACE / REMOVE PACEMAKER    . INSERTION OF ILIAC STENT Bilateral 08/25/2019   Procedure: INSERTION OF ILIAC STENT;  Surgeon: Algernon Huxley, MD;  Location: [facility] ORS;  Service: Vascular;  Laterality: Bilateral;  . JOINT REPLACEMENT Left 2013   knee  . JOINT REPLACEMENT Right 2004   knee  . KNEE SURGERY     left knee 1991 and 1992; right knee 1995  . LEFT HEART CATHETERIZATION WITH CORONARY ANGIOGRAM N/A 08/26/2014   Procedure: LEFT HEART CATHETERIZATION WITH CORONARY ANGIOGRAM;  Surgeon: [name], MD;  Location: [facility] CATH LAB;  Service: Cardiovascular;  Laterality: N/A;  . LOWER EXTREMITY ANGIOGRAPHY Left 08/23/2019   Procedure: Lower Extremity Angiography;  Surgeon: Algernon Huxley, MD;  Location: [place] CV LAB;  Service: Cardiovascular;  Laterality: Left;  . LUMBAR LAMINECTOMY/DECOMPRESSION MICRODISCECTOMY N/A 06/07/2014   Procedure: LUMBAR FOUR TO FIVE LUMBAR LAMINECTOMY/DECOMPRESSION MICRODISCECTOMY 1 LEVEL;  Surgeon: [name], MD;  Location: [place] NEURO ORS;  Service: Neurosurgery;  Laterality: N/A;  .  LUNG BIOPSY Right 2016   Dr [name]  . MOHS SURGERY    . PACEMAKER INSERTION     PPM-- St Jude 11/30/10 by [name]  . PPM GENERATOR CHANGEOUT N/A 07/09/2019   Procedure: PPM GENERATOR CHANGEOUT;  Surgeon: Deboraha Sprang, MD;  Location: [place] CV LAB;  Service: Cardiovascular;  Laterality: N/A;  . PROSTATE SURGERY     cancer--1998, prostatectomy  . REPLACEMENT TOTAL KNEE     2004  . RIGHT/LEFT HEART CATH AND CORONARY ANGIOGRAPHY Bilateral 04/13/2021   Procedure: RIGHT/LEFT HEART CATH AND CORONARY ANGIOGRAPHY;  Surgeon: Wellington Hampshire, MD;  Location: [place] CV LAB;  Service: Cardiovascular;  Laterality: Bilateral;  . ruptured disc     1962 and  1998  . TEE WITHOUT CARDIOVERSION N/A 09/01/2019   Procedure: TRANSESOPHAGEAL ECHOCARDIOGRAM (TEE);  Surgeon: [name], MD;  Location: [facility] ORS;  Service: Cardiovascular;  Laterality: N/A;  . TEMPORARY PACEMAKER N/A 07/09/2019   Procedure: TEMPORARY PACEMAKER;  Surgeon: Deboraha Sprang, MD;  Location: [place] CV LAB;  Service: Cardiovascular;  Laterality: N/A;  . TRIGGER FINGER RELEASE  01-24-15  . WOUND DEBRIDEMENT Left 06/07/2015   Procedure: DEBRIDEMENT WOUND;  Surgeon: Robert Bellow, MD;  Location: [facility] ORS;  Service: General;  Laterality: Left;  left upper back    SOCIAL HISTORY: Social History   Socioeconomic History  . Marital status: Married    Spouse name: Not on file  . Number of children: 2  . Years of education: College  . Highest education level: Some college, no degree  Occupational History  . Occupation: Retired  Tobacco Use  . Smoking status: Former Smoker    Packs/day: 1.00    Years: 56.00    Pack years: 56.00    Types: Cigarettes    Quit date: 2011    Years since quitting: 11.3  . Smokeless tobacco: Never Used  Vaping Use  . Vaping Use: Never used  Substance and Sexual Activity  . Alcohol use: No  . Drug use: No  . Sexual activity: Not on file  Other Topics Concern  . Not on file  Social History Narrative   Drinks 2 cups of coffee a day    Social Determinants of Health   Financial Resource Strain: Low Risk   . Difficulty of Paying Living Expenses: Not hard at all  Food Insecurity: No Food Insecurity  . Worried About Charity fundraiser in the Last Year: Never true  . Ran Out of Food in the Last Year: Never true  Transportation Needs: No Transportation Needs  . Lack of Transportation (Medical): No  . Lack of Transportation (Non-Medical): No  Physical Activity: Inactive  . Days of Exercise per Week: 0 days  . Minutes of Exercise per Session: 0 min  Stress: No Stress Concern Present  . Feeling of Stress : Not at all  Social  Connections: Moderately Isolated  . Frequency of Communication with Friends and Family: Twice a week  . Frequency of Social Gatherings with Friends and Family: Twice a week  . Attends Religious Services: Never  . Active Member of Clubs or Organizations: No  . Attends Archivist Meetings: Never  . Marital Status: Married  Human resources officer Violence: Not At Risk  . Fear of Current or Ex-Partner: No  . Emotionally Abused: No  . Physically Abused: No  . Sexually Abused: No    FAMILY HISTORY: Family History  Problem Relation Age of Onset  . Heart attack Mother   .  Hyperlipidemia Mother   . CAD Other   . Prostate cancer Neg Hx   . Cancer Neg Hx     ALLERGIES:  is allergic to sulfa antibiotics.  MEDICATIONS:  Current Outpatient Medications  Medication Sig Dispense Refill  . albuterol (VENTOLIN HFA) 108 (90 Base) MCG/ACT inhaler Inhale 2 puffs into the lungs every 6 (six) hours as needed for wheezing or shortness of breath. 8 g 2  . aspirin EC 81 MG tablet Take 81 mg by mouth daily.    . cetirizine (ZYRTEC) 10 MG tablet Take 10 mg by mouth daily.    . Cholecalciferol (VITAMIN D3) 1000 UNITS CAPS Take 1,000 Units by mouth daily.    Marland Kitchen donepezil (ARICEPT) 5 MG tablet Take 5 mg by mouth daily.    Marland Kitchen ELIQUIS 5 MG TABS tablet TAKE 1 TABLET TWICE A DAY  (SWITCHED FROM PLAVIX) (Patient taking differently: Take 5 mg by mouth 2 (two) times daily.) 60 tablet 11  . ENTRESTO 24-26 MG TAKE 1 TABLET TWICE A DAY (Patient taking differently: Take 1 tablet by mouth 2 (two) times daily.) 90 tablet 3  . ezetimibe (ZETIA) 10 MG tablet TAKE 1 TABLET DAILY (Patient taking differently: Take 10 mg by mouth daily.) 90 tablet 2  . Fluticasone-Umeclidin-Vilant (TRELEGY ELLIPTA) 100-62.5-25 MCG/INH AEPB Inhale 1 puff into the lungs daily. 180 each 3  . furosemide (LASIX) 20 MG tablet Take 1 tablet (20 mg total) by mouth daily. 30 tablet 5  . isosorbide mononitrate (IMDUR) 30 MG 24 hr tablet TAKE 1 TABLET  DAILY (Patient taking differently: Take 30 mg by mouth daily.) 90 tablet 2  . metFORMIN (GLUCOPHAGE) 500 MG tablet Take 1 tablet (500 mg total) by mouth 2 (two) times daily with a meal. 180 tablet 1  . metoprolol succinate (TOPROL-XL) 25 MG 24 hr tablet TAKE 1 TABLET AT BEDTIME (Patient taking differently: Take 25 mg by mouth at bedtime.) 90 tablet 2  . montelukast (SINGULAIR) 10 MG tablet Take 10 mg by mouth daily as needed (sneezing/allergies.).    Marland Kitchen Multiple Vitamin (MULTIVITAMIN WITH MINERALS) TABS tablet Take 1 tablet by mouth daily. One-A-Day Multivitamin    . omeprazole (PRILOSEC) 40 MG capsule TAKE 1 CAPSULE TWICE DAILY AS NEEDED (Patient taking differently: Take 40 mg by mouth 2 (two) times daily.) 180 capsule 3  . simvastatin (ZOCOR) 40 MG tablet TAKE 1 TABLET AT BEDTIME (Patient taking differently: Take 40 mg by mouth at bedtime.) 90 tablet 3  . spironolactone (ALDACTONE) 25 MG tablet TAKE 1 TABLET DAILY (DOSE  DECREASE) (Patient taking differently: Take 25 mg by mouth daily.) 90 tablet 1  . acetaminophen (TYLENOL) 500 MG tablet Take 1,000 mg by mouth every 6 (six) hours as needed for moderate pain. (Patient not taking: No sig reported)    . triamcinolone (NASACORT) 55 MCG/ACT AERO nasal inhaler Place 2 sprays into the nose daily as needed (congestion). (Patient not taking: Reported on 04/16/2021)     No current facility-administered medications for this visit.     PHYSICAL EXAMINATION: ECOG PERFORMANCE STATUS: 1 - Symptomatic but completely ambulatory Vitals:   04/16/21 1057  BP: 127/68  Pulse: 69  Resp: 18  Temp: 98.6 F (37 C)   Filed Weights   04/16/21 1057  Weight: 193 lb 14.4 oz (88 kg)    Physical Exam Constitutional:      General: He is not in acute distress.    Comments: Patient sits in the wheelchair today.  He is able to walk with  a cane.  HENT:     Head: Normocephalic and atraumatic.  Eyes:     General: No scleral icterus. Cardiovascular:     Rate and  Rhythm: Normal rate and regular rhythm.     Heart sounds: Normal heart sounds.  Pulmonary:     Effort: Pulmonary effort is normal. No respiratory distress.     Breath sounds: No wheezing.  Abdominal:     General: Bowel sounds are normal. There is no distension.     Palpations: Abdomen is soft.  Musculoskeletal:        General: No deformity. Normal range of motion.     Cervical back: Normal range of motion and neck supple.  Skin:    General: Skin is warm and dry.     Findings: No erythema or rash.  Neurological:     Mental Status: He is alert and oriented to person, place, and time. Mental status is at baseline.     Cranial Nerves: No cranial nerve deficit.     Coordination: Coordination normal.  Psychiatric:        Mood and Affect: Mood normal.     LABORATORY DATA:  I have reviewed the data as listed Lab Results  Component Value Date   WBC 8.7 04/09/2021   HGB 10.6 (L) 04/09/2021   HCT 34.1 (L) 04/09/2021   MCV 78.2 (L) 04/09/2021   PLT 355 04/09/2021   Recent Labs    05/08/20 0958 06/05/20 0914 09/27/20 1010 11/13/20 0833 04/09/21 1011 04/16/21 1149  NA 139 137  --  139 138  --   K 4.3 4.6  --  4.6 4.3  --   CL 106 106  --  104 107  --   CO2 18* 22  --  19* 23  --   GLUCOSE 133* 138*  --  131* 142*  --   BUN 22 25*  --  23 28*  --   CREATININE 1.11 1.12 1.20 1.24 1.16  --   CALCIUM 9.3 9.2  --  9.4 9.1  --   GFRNONAA 59* 59*  --  52* >60  --   GFRAA 69 >60  --  60  --   --   PROT 6.5  --   --  6.7  --  6.9  ALBUMIN 4.4  --   --  4.2  --  3.5  AST 16  --   --  16  --  22  ALT 10  --   --  10  --  13  ALKPHOS 53  --   --  54  --  48  BILITOT 0.4  --   --  0.5  --  0.8  BILIDIR  --   --   --   --   --  0.1  IBILI  --   --   --   --   --  0.7   Iron/TIBC/Ferritin/ %Sat    Component Value Date/Time   IRON 33 (L) 04/16/2021 1149   TIBC 346 04/16/2021 1149   FERRITIN 35 04/16/2021 1149   IRONPCTSAT 10 (L) 04/16/2021 1149      RADIOGRAPHIC STUDIES: I  have personally reviewed the radiological images as listed and agreed with the findings in the report. CT HEAD WO CONTRAST  Result Date: 03/27/2021 CLINICAL DATA:  Mild cognitive impairment EXAM: CT HEAD WITHOUT CONTRAST TECHNIQUE: Contiguous axial images were obtained from the base of the skull through the vertex without intravenous contrast. COMPARISON:  April 2018 FINDINGS: Brain: There is no acute intracranial hemorrhage, mass effect, or edema. Gray-white differentiation is preserved. There is no extra-axial fluid collection. Prominence of the ventricles and sulci reflects generalized parenchymal volume loss slightly increased since the prior study. Patchy hypoattenuation in the supratentorial white matter is nonspecific but probably reflects similar chronic microvascular ischemic changes. Vascular: There is atherosclerotic calcification at the skull base. Skull: Calvarium is unremarkable. Sinuses/Orbits: No acute finding. Other: Polypoid tissue within the anterior superior nasal cavity on the left. This is improved compared to 2019 maxillofacial CT. IMPRESSION: Slightly increased parenchymal volume loss compared to 2018 study. Similar chronic microvascular ischemic changes. Electronically Signed   By: Macy Mis M.D.   On: 03/27/2021 12:04   CT Chest W Contrast  Result Date: 04/03/2021 CLINICAL DATA:  Non-small cell lung cancer follow-up EXAM: CT CHEST WITH CONTRAST TECHNIQUE: Multidetector CT imaging of the chest was performed during intravenous contrast administration. CONTRAST:  28mL OMNIPAQUE IOHEXOL 300 MG/ML  SOLN COMPARISON:  09/27/2020 FINDINGS: Cardiovascular: Aortic atherosclerosis. Left chest multi lead pacer. Normal heart size. Extensive 3 vessel coronary artery calcifications and/or stents. No pericardial effusion. Mediastinum/Nodes: Interval enlargement of right hilar, subcarinal, pretracheal, and superior right paratracheal lymph nodes, largest subcarinal node measuring 4.0 x 2.2 cm,  previously 2.8 x 0.9 cm (series 2, image 74). Thyroid gland, trachea, and esophagus demonstrate no significant findings. Lungs/Pleura: Diffuse bilateral bronchial wall thickening. Minimal centrilobular emphysema. Interval enlargement of a spiculated nodule of the right lower lobe, measuring 2.5 x 2.1 cm, previously 1.7 x 1.3 cm when measured similarly (series 3, image 108). Unchanged, partially calcified nodule of the medial superior segment right lower lobe measuring 2.8 x 2.0 cm (series 3, image 82). Unchanged partially calcified nodular consolidation of the posterior lingula measuring 2.4 x 1.8 cm (series 3, image 99). Unchanged partially calcified cystic lesion of the medial segment right middle lobe or adjacent pleura or mediastinum measuring 2.2 x 2.1 cm (series 2, image 110). No pleural effusion or pneumothorax. Upper Abdomen: No acute abnormality. Musculoskeletal: No chest wall mass or suspicious bone lesions identified. IMPRESSION: 1. Interval enlargement of a spiculated nodule of the right lower lobe, measuring 2.5 x 2.1 cm, previously 1.7 x 1.3 cm. 2. Interval enlargement of right hilar, subcarinal, pretracheal, and superior right paratracheal lymph nodes, largest subcarinal node measuring 4.0 x 2.2 cm, previously 2.8 x 0.9 cm. 3. Findings are consistent with enlarging primary lung malignancy and new nodal metastatic disease. 4. Coronary artery disease. Aortic Atherosclerosis (ICD10-I70.0) and Emphysema (ICD10-J43.9). Electronically Signed   By: Eddie Candle M.D.   On: 04/03/2021 08:55   CARDIAC CATHETERIZATION  Result Date: 04/13/2021  Prox RCA lesion is 30% stenosed.  Dist RCA lesion is 50% stenosed.  Prox LAD to Mid LAD lesion is 20% stenosed.  Ost Cx to Prox Cx lesion is 30% stenosed.  1st Mrg lesion is 40% stenosed.  2nd Diag lesion is 60% stenosed.  1.  Mild to moderate three-vessel coronary artery disease with severe calcifications.  No obstructive lesions. 2.  Left ventricular  angiography was not performed.  EF was severely reduced by echo. 3.  Right heart catheterization showed minimally elevated filling pressures, mild pulmonary hypertension and normal cardiac output. Recommendations: The patient has nonischemic cardiomyopathy. Continue aggressive medical therapy. Volume status appears good.   NM PET Image Restag (PS) Skull Base To Thigh  Result Date: 04/13/2021 CLINICAL DATA:  Subsequent treatment strategy for non-small cell lung cancer. EXAM: NUCLEAR MEDICINE PET SKULL BASE TO THIGH  TECHNIQUE: 10.2 mCi F-18 FDG was injected intravenously. Full-ring PET imaging was performed from the skull base to thigh after the radiotracer. CT data was obtained and used for attenuation correction and anatomic localization. Fasting blood glucose: 130 mg/dl COMPARISON:  PET-CT 06/19/2020. FINDINGS: Mediastinal blood pool activity: SUV max 2.95 Liver activity: SUV max 4.23 NECK: No hypermetabolic lymph nodes in the neck. Incidental CT findings: none CHEST: Right lower lobe pulmonary nodule in the base (axial image 127 of series 3) measuring 2.7 x 2.0 cm (SUVmax = 6.3). Bulky subcarinal lymphadenopathy the where there is a conglomeration of malignant lymph nodes which measure up to 3.8 x 2.0 cm (axial image 98 of series 3) which are diffusely hypermetabolic (SUVmax = 76.2). Low left paratracheal lymph node measuring 1.4 cm in short axis (axial image 85 of series 3) is also hypermetabolic (SUVmax = 6.0). High right paraesophageal lymph node measuring only 8 mm in short axis (axial image 74 of series 3) is also hypermetabolic (SUVmax = 4.1). Incidental CT findings: Trace bilateral pleural effusions lying dependently. Large calcified granulomas in the posterior aspect of the right lower lobe and anterior aspect of the left lower lobe, similar to priors. Pleural calcifications in the medial aspect of the right hemithorax, similar to numerous prior examinations, likely sequela of remote pleural infection  or hemorrhage. Atherosclerotic calcifications in the thoracic aorta as well as the left main, left anterior descending, left circumflex and right coronary arteries. Calcifications of the aortic valve and mitral valve/annulus. Left-sided pacemaker device in place with lead tips terminating in the right atrium and right ventricular apex. ABDOMEN/PELVIS: No abnormal hypermetabolic activity within the liver, pancreas, adrenal glands, or spleen. No hypermetabolic lymph nodes in the abdomen or pelvis. Incidental CT findings: Status post cholecystectomy. Aortic atherosclerosis. Status post aorto bi-iliac stent graft which is incompletely evaluated on today's noncontrast CT examination. Normal appendix. Surgical clips along the pelvic sidewall bilaterally, likely from prior lymph node dissection. Large well-circumscribed low-attenuation lesion in the left inguinal region demonstrating no internal hypermetabolism, likely to represent a chronic postprocedural seroma, similar to remote prior PET-CT 06/19/2020. SKELETON: No focal hypermetabolic activity to suggest skeletal metastasis. Incidental CT findings: none IMPRESSION: 1. 2.7 x 2.0 cm hypermetabolic right lower lobe pulmonary nodule, with bilateral mediastinal lymphadenopathy. No definite signs of remote metastatic disease. Findings are concerning for primary bronchogenic carcinoma, and appear to represent T1c, N3, M0 (i.e., stage IIIB) disease. 2. Aortic atherosclerosis, in addition to left main and 3 vessel coronary artery disease. 3. There are calcifications of the aortic valve and mitral valve/annulus. Echocardiographic correlation for evaluation of potential valvular dysfunction may be warranted if clinically indicated. 4. Additional incidental findings, as above. Electronically Signed   By: Vinnie Langton M.D.   On: 04/13/2021 09:25   ECHOCARDIOGRAM COMPLETE  Result Date: 04/05/2021    ECHOCARDIOGRAM REPORT   Patient Name:   Howard [name] Date of Exam:  04/05/2021 Medical Rec #:  [id number]      Height:       69.0 in Accession #:    [id number]     Weight:       195.0 lb Date of Birth:  1931/07/08       BSA:          2.044 m Patient Age:    23 years       BP:           170/80 mmHg Patient Gender: M  HR:           83 bpm. Exam Location:  [place] Procedure: 2D Echo, 3D Echo, Cardiac Doppler, Color Doppler and Strain Analysis Indications:    R06.02 SOB  History:        Patient has prior history of Echocardiogram examinations, most                 recent 09/01/2019. Cardiomyopathy, COPD and Lung CA,                 Signs/Symptoms:Shortness of Breath; Risk Factors:Sleep Apnea.  Sonographer:    Geradine Girt Referring Phys: [id number] CARMEN L GONZALEZ IMPRESSIONS  1. Left ventricular ejection fraction, by estimation, is 20 to 25%. The left ventricle has severely decreased function. The left ventricle demonstrates global hypokinesis. There is mild asymmetric left ventricular hypertrophy. Left ventricular diastolic  parameters are consistent with Grade II diastolic dysfunction (pseudonormalization). The average left ventricular global longitudinal strain is -4.6 %. The global longitudinal strain is abnormal.  2. Right ventricular systolic function is moderately reduced. The right ventricular size is normal.  3. The mitral valve is degenerative. Mild mitral valve regurgitation.  4. The aortic valve is tricuspid. Aortic valve regurgitation is not visualized. Mild to moderate aortic valve sclerosis/calcification is present, without any evidence of aortic stenosis.  5. The inferior vena cava is dilated in size with <50% respiratory variability, suggesting right atrial pressure of 15 mmHg. FINDINGS  Left Ventricle: Left ventricular ejection fraction, by estimation, is 20 to 25%. The left ventricle has severely decreased function. The left ventricle demonstrates global hypokinesis. The average left ventricular global longitudinal strain is -4.6 %. The global longitudinal strain  is abnormal. The left ventricular internal cavity size was normal in size. There is mild asymmetric left ventricular hypertrophy. Left ventricular diastolic parameters are consistent with Grade II diastolic dysfunction (pseudonormalization). Right Ventricle: The right ventricular size is normal. No increase in right ventricular wall thickness. Right ventricular systolic function is moderately reduced. Left Atrium: Left atrial size was normal in size. Right Atrium: Right atrial size was normal in size. Pericardium: There is no evidence of pericardial effusion. Mitral Valve: The mitral valve is degenerative in appearance. Mild mitral annular calcification. Mild mitral valve regurgitation. MV peak gradient, 11.3 mmHg. The mean mitral valve gradient is 5.0 mmHg. Tricuspid Valve: The tricuspid valve is normal in structure. Tricuspid valve regurgitation is not demonstrated. Aortic Valve: The aortic valve is tricuspid. Aortic valve regurgitation is not visualized. Mild to moderate aortic valve sclerosis/calcification is present, without any evidence of aortic stenosis. Aortic valve mean gradient measures 2.0 mmHg. Aortic valve peak gradient measures 4.8 mmHg. Aortic valve area, by VTI measures 1.58 cm. Pulmonic Valve: The pulmonic valve was normal in structure. Pulmonic valve regurgitation is trivial. Aorta: The aortic root is normal in size and structure. Venous: The inferior vena cava is dilated in size with less than 50% respiratory variability, suggesting right atrial pressure of 15 mmHg. IAS/Shunts: No atrial level shunt detected by color flow Doppler. Additional Comments: A device lead is visualized.  LEFT VENTRICLE PLAX 2D LVIDd:         4.90 cm      Diastology LVIDs:         3.80 cm      LV e' medial:    3.05 cm/s LV PW:         0.80 cm      LV E/e' medial:  33.4 LV IVS:  1.80 cm      LV e' lateral:   2.61 cm/s LVOT diam:     2.00 cm      LV E/e' lateral: 39.1 LV SV:         33 LV SV Index:   16           2D  Longitudinal Strain LVOT Area:     3.14 cm     2D Strain GLS Avg:     -4.6 %  LV Volumes (MOD) LV vol d, MOD A4C: 223.0 ml LV vol s, MOD A4C: 128.0 ml LV SV MOD A4C:     223.0 ml RIGHT VENTRICLE RV S prime:     10.70 cm/s TAPSE (M-mode): 1.2 cm LEFT ATRIUM             Index       RIGHT ATRIUM           Index LA diam:        4.30 cm 2.10 cm/m  RA Area:     13.70 cm LA Vol (A2C):   73.4 ml 35.91 ml/m RA Volume:   35.00 ml  17.12 ml/m LA Vol (A4C):   41.1 ml 20.11 ml/m LA Biplane Vol: 58.8 ml 28.77 ml/m  AORTIC VALVE AV Area (Vmax):    1.42 cm AV Area (Vmean):   1.38 cm AV Area (VTI):     1.58 cm AV Vmax:           110.00 cm/s AV Vmean:          69.000 cm/s AV VTI:            0.209 m AV Peak Grad:      4.8 mmHg AV Mean Grad:      2.0 mmHg LVOT Vmax:         49.60 cm/s LVOT Vmean:        30.300 cm/s LVOT VTI:          0.105 m LVOT/AV VTI ratio: 0.50  AORTA Ao Root diam: 3.40 cm Ao Asc diam:  3.20 cm MITRAL VALVE MV Area (PHT): 3.40 cm     SHUNTS MV Area VTI:   1.35 cm     Systemic VTI:  0.10 m MV Peak grad:  11.3 mmHg    Systemic Diam: 2.00 cm MV Mean grad:  5.0 mmHg MV Vmax:       1.68 m/s MV Vmean:      106.0 cm/s MV Decel Time: 223 msec MV E velocity: 102.00 cm/s MV A velocity: 126.00 cm/s MV E/A ratio:  0.81 Kate Sable MD Electronically signed by Kate Sable MD Signature Date/Time: 04/05/2021/5:30:58 PM    Final    CUP PACEART REMOTE DEVICE CHECK  Result Date: 04/16/2021 Scheduled remote reviewed. 3 AMS events 6-8 seconds.  Normal device function.  R. Powers, CVRS Next remote 91 days.     ASSESSMENT & PLAN:  1. Right lower lobe lung mass   2. Goals of care, counseling/discussion   3. Mediastinal lymphadenopathy   4. Chronic systolic congestive heart failure (HCC)    #History of right upper lobe lung mass status post SBRT. Enlarging right lower lobe lung mass, with mediastinal lymphadenopathy. Suspect primary bronchogenic lung carcinoma. CT images were independently reviewed  by me and discussed with patient. Clinically this is consistent with stage IIIB. Recommend brain image-patient had CT head without contrast on 03/27/2021-no obvious brain mets.   Recommend patient to see pulmonology for evaluation of bronchoscopy biopsy  feasibility.  If confirmed with non-small cell lung cancer, will recommend concurrent chemoradiation. Standard of care was discussed with patient.  Rationale of low-dose carboplatin AUC of 2 with Taxol 45 mg per metered square weekly concurrently with radiation was discussed. I explained to the patient the risks and benefits of chemotherapy including all but not limited to hair loss, mouth sore, nausea, vomiting, diarrhea, low blood counts, bleeding, neuropathy and risk of life threatening infection and even death, secondary malignancy etc.  . Patient is 5 has multiple comorbidities, performance status 1.  If he prefers not to proceed with chemotherapy or if risk of getting biopsy is too high, I would recommend patient to proceed with radiation only.  Can consider post radiation immunotherapy options. I will present patient's case on tumor board this week.  #CHF Echocardiogram 04/05/2021 showed LVEF 20 to 25%  #Microcytic anemia, MCV 10.6. Recommend patient to check iron TIBC ferritin.- Labs reviewed.  Iron saturation 10%, TIBC 346, ferritin 35.  I Recommend patient to start oral iron supplementation.  No recent colonoscopy.  I will further discussed with patient about gastroenterology work-up during his next visit.  Plan was discussed with Dr. Baruch Gouty. Orders Placed This Encounter  Procedures  . Hepatic function panel    Standing Status:   Future    Number of Occurrences:   1    Standing Expiration Date:   04/16/2022  . Iron and TIBC    Standing Status:   Future    Number of Occurrences:   1    Standing Expiration Date:   04/16/2022  . Ferritin    Standing Status:   Future    Number of Occurrences:   1    Standing Expiration Date:    04/16/2022  . Ambulatory referral to Pulmonology    Referral Priority:   Routine    Referral Type:   Consultation    Referral Reason:   Specialty Services Required    Referred to Provider:   Tyler Pita, MD    Requested Specialty:   Pulmonary Disease    Number of Visits Requested:   1    All questions were answered. The patient knows to call the clinic with any problems questions or concerns.  cc Chrismon, [name], PA-C    Return of visit: to be determined.  Thank you for this kind referral and the opportunity to participate in the care of this patient. A copy of today's note is routed to referring provider    Earlie Server, MD, PhD Hematology Oncology [facility] Pager- [phone] 04/16/2021

## 2021-04-16 NOTE — Addendum Note (Signed)
Addended by: Earlie Server on: 04/16/2021 07:51 PM   Modules accepted: Orders

## 2021-04-17 ENCOUNTER — Ambulatory Visit: Payer: Medicare Other | Admitting: Physical Therapy

## 2021-04-17 ENCOUNTER — Encounter: Payer: Medicare Other | Admitting: Speech Pathology

## 2021-04-17 ENCOUNTER — Telehealth: Payer: Self-pay

## 2021-04-17 NOTE — Telephone Encounter (Signed)
Pt informed via phone.

## 2021-04-17 NOTE — Telephone Encounter (Signed)
My Chart message sent

## 2021-04-17 NOTE — Telephone Encounter (Signed)
-----   Message from Earlie Server, MD sent at 04/16/2021  7:49 PM EDT ----- Please let patient / son know that his iron level is low and I recommend oral iron supplementation.  Rx sent to pharmacy.

## 2021-04-19 ENCOUNTER — Other Ambulatory Visit: Payer: Medicare Other

## 2021-04-19 ENCOUNTER — Ambulatory Visit
Admission: RE | Admit: 2021-04-19 | Discharge: 2021-04-19 | Disposition: A | Discharge status: Home / Self Care | Payer: Medicare Other | Source: Ambulatory Visit | Attending: Radiation Oncology | Admitting: Radiation Oncology

## 2021-04-19 DIAGNOSIS — C3411 Malignant neoplasm of upper lobe, right bronchus or lung: Secondary | ICD-10-CM | POA: Diagnosis not present

## 2021-04-19 DIAGNOSIS — F17210 Nicotine dependence, cigarettes, uncomplicated: Secondary | ICD-10-CM | POA: Diagnosis not present

## 2021-04-20 ENCOUNTER — Ambulatory Visit: Payer: Medicare Other

## 2021-04-20 ENCOUNTER — Other Ambulatory Visit: Payer: Self-pay | Admitting: *Deleted

## 2021-04-20 ENCOUNTER — Encounter: Payer: Self-pay | Admitting: Pulmonary Disease

## 2021-04-20 DIAGNOSIS — C3411 Malignant neoplasm of upper lobe, right bronchus or lung: Secondary | ICD-10-CM

## 2021-04-20 NOTE — Progress Notes (Signed)
Tumor Board Documentation  Howard [name] was presented by Dr Tasia Catchings at our Tumor Board on 04/19/2021, which included representatives from medical oncology,radiation oncology,surgical oncology,internal medicine,navigation,pathology,radiology,surgical,genetics,research,palliative care,pulmonology.  Amaury currently presents as a new patient,for MDC,for progression with history of the following treatments: active survellience,neoadjuvant radiation.  Additionally, we reviewed previous medical and familial history, history of present illness, and recent lab results along with all available histopathologic and imaging studies. The tumor board considered available treatment options and made the following recommendations:   Referred to Pulmonology, Reimage after failure has improved, Possible Radiation Therapy  The following procedures/referrals were also placed: No orders of the defined types were placed in this encounter.   Clinical Trial Status: not discussed   Staging used: Clinical Stage  AJCC Staging:       Group: Clinical Stage III B Lung Cancer   National site-specific guidelines NCCN were discussed with respect to the case.  Tumor board is a meeting of clinicians from various specialty areas who evaluate and discuss patients for whom a multidisciplinary approach is being considered. Final determinations in the plan of care are those of the provider(s). The responsibility for follow up of recommendations given during tumor board is that of the provider.   Today's extended care, comprehensive team conference, Sirron was not present for the discussion and was not examined.   Multidisciplinary Tumor Board is a multidisciplinary case peer review process.  Decisions discussed in the Multidisciplinary Tumor Board reflect the opinions of the specialists present at the conference without having examined the patient.  Ultimately, treatment and diagnostic decisions rest with the primary provider(s) and  the patient.

## 2021-04-23 ENCOUNTER — Other Ambulatory Visit: Payer: Self-pay

## 2021-04-23 ENCOUNTER — Ambulatory Visit (INDEPENDENT_AMBULATORY_CARE_PROVIDER_SITE_OTHER): Payer: Medicare Other | Admitting: Physician Assistant

## 2021-04-23 ENCOUNTER — Encounter: Payer: Self-pay | Admitting: Physician Assistant

## 2021-04-23 VITALS — BP 138/74 | HR 69 | Ht 69.0 in | Wt 189.0 lb

## 2021-04-23 DIAGNOSIS — Q211 Atrial septal defect: Secondary | ICD-10-CM

## 2021-04-23 DIAGNOSIS — I2510 Atherosclerotic heart disease of native coronary artery without angina pectoris: Secondary | ICD-10-CM

## 2021-04-23 DIAGNOSIS — Z8679 Personal history of other diseases of the circulatory system: Secondary | ICD-10-CM

## 2021-04-23 DIAGNOSIS — Z7901 Long term (current) use of anticoagulants: Secondary | ICD-10-CM | POA: Diagnosis not present

## 2021-04-23 DIAGNOSIS — I739 Peripheral vascular disease, unspecified: Secondary | ICD-10-CM | POA: Diagnosis not present

## 2021-04-23 DIAGNOSIS — Q2112 Patent foramen ovale: Secondary | ICD-10-CM

## 2021-04-23 DIAGNOSIS — Z9889 Other specified postprocedural states: Secondary | ICD-10-CM | POA: Diagnosis not present

## 2021-04-23 DIAGNOSIS — C3431 Malignant neoplasm of lower lobe, right bronchus or lung: Secondary | ICD-10-CM

## 2021-04-23 DIAGNOSIS — C3411 Malignant neoplasm of upper lobe, right bronchus or lung: Secondary | ICD-10-CM | POA: Diagnosis not present

## 2021-04-23 DIAGNOSIS — I428 Other cardiomyopathies: Secondary | ICD-10-CM

## 2021-04-23 DIAGNOSIS — I5042 Chronic combined systolic (congestive) and diastolic (congestive) heart failure: Secondary | ICD-10-CM | POA: Diagnosis not present

## 2021-04-23 DIAGNOSIS — Z950 Presence of cardiac pacemaker: Secondary | ICD-10-CM

## 2021-04-23 DIAGNOSIS — E785 Hyperlipidemia, unspecified: Secondary | ICD-10-CM

## 2021-04-23 DIAGNOSIS — F17210 Nicotine dependence, cigarettes, uncomplicated: Secondary | ICD-10-CM | POA: Diagnosis not present

## 2021-04-23 MED ORDER — SPIRONOLACTONE 25 MG PO TABS: 25.0000 mg | ORAL_TABLET | Freq: Every day | ORAL | 3 refills | Status: DC

## 2021-04-23 MED ORDER — DAPAGLIFLOZIN PROPANEDIOL 10 MG PO TABS: 10.0000 mg | ORAL_TABLET | Freq: Every day | ORAL | 3 refills | Status: DC

## 2021-04-23 MED ORDER — ENTRESTO 49-51 MG PO TABS: 1.0000 | ORAL_TABLET | Freq: Two times a day (BID) | ORAL | 3 refills | Status: DC

## 2021-04-23 NOTE — Progress Notes (Signed)
Remote pacemaker transmission.   

## 2021-04-23 NOTE — Progress Notes (Signed)
Office Visit    Patient Name: Howard [name] Date of Encounter: 04/23/2021  PCP:  Chrismon, Dennis E, [place]  Cardiologist:  [name], MD  Advanced Practice Provider:  No care team member to display Electrophysiologist:  [name], MD   Chief Complaint    Chief Complaint  Patient presents with  . Follow-up    2 Week follow up and S/P Catheter. Medications verbally reviewed with patient.     87 y.o. male with history of chronic combined systolic and diastolic heart failure with newly reduced EF (03/2021 EF 20-25%, asymmetric LVH / G2DD), NICM, nonobstructive CAD (LHC 03/2021), WPW s/p radiofrequency catheter ablation and subsequent development of bundle branch block and complete heart block requiring PPM 2011 with generator change out 06/2019 secondary to ERI, history of 2020 splenic artery infarct now on chronic OAC, PFO / positive bubble study (echo 2020), lung carcinoma (recently diagnosed) with upcoming chemotherapy treatment by oncology, PAD s/p interventions as below (including AAA s/p endovascular stent graft, left common femoral endarterectomy, bilateral external iliac artery stenting, left SFA stenting, right renal artery stenting), mild MR with degenerative valve/aortic sclerosis (03/2021), DM2, hypertension, hyperlipidemia, Bell's palsy, prostate cancer, vertigo, chronic back pain s/p multiple surgeries, sleep apnea, and who presents today for follow-up after recent catheterization.  Past Medical History    Past Medical History:  Diagnosis Date  . Arthritis   . Bell palsy   . Bell's palsy 04/12/2015  . Cancer [facility])    prostate and skin  . Chronic combined systolic and diastolic CHF, NYHA class 1 ([place])    a. 07/2014 Echo: EF 35-40%, Gr 1 DD.  Marland Kitchen Complete heart block ([place])    a. 11/2010 s/p SJM 2210 [employer] DC PPM, ser# [id number].  . Depression   . Diabetes mellitus without complication ([place])   . Fall 11-10-14  . GERD  (gastroesophageal reflux disease)   . History of prostate cancer   . Hyperlipidemia   . Hypertension   . LBBB (left bundle branch block)   . Left-sided Bell's palsy   . Lung cancer ([place]) 2016  . NICM (nonischemic cardiomyopathy) ([place])    a. 07/2014 Echo: EF 35-40%, mid-apicalanteroseptal DK, Gr 1 DD, mild-mod dil LA.  . Non-obstructive CAD    a. 07/2014 Abnl MV;  b. 08/2014 Cath: LM nl, LAD 30p, RI 40p, LCX nl, OM1 40, RCA dominant 30p, 70d-->Med Rx.  Marland Kitchen Poor balance   . Presence of permanent cardiac pacemaker   . Sleep apnea    a. cpap  . Vertigo   . WPW (Wolff-Parkinson-White syndrome)    a. S/P RFCA 1991.   Past Surgical History:  Procedure Laterality Date  . ABDOMINAL AORTIC ENDOVASCULAR STENT GRAFT  08/25/2019   Procedure: ABDOMINAL AORTIC ENDOVASCULAR STENT GRAFT;  Surgeon: Algernon Huxley, MD;  Location: [facility] ORS;  Service: Vascular;;  . ANGIOPLASTY Left 08/25/2019   Procedure: ANGIOPLASTY;  Surgeon: Algernon Huxley, MD;  Location: [facility] ORS;  Service: Vascular;  Laterality: Left;  left SFA and stent placement  . APPLICATION OF WOUND VAC Left 06/07/2015   Procedure: APPLICATION OF WOUND VAC;  Surgeon: Robert Bellow, MD;  Location: [facility] ORS;  Service: General;  Laterality: Left;  left upper back  . BACK SURGERY     2011  . CARDIAC CATHETERIZATION  08/26/2014   Single vessel obstructive CAD  . CARPAL TUNNEL RELEASE  04-04-15   [facility]  . CATARACT EXTRACTION  07-31-11 and  09-18-11  . Catheter ablation  1991   for WPW  . cervical fusion    . CHOLECYSTECTOMY  09-07-14  . ENDARTERECTOMY FEMORAL Left 08/25/2019   Procedure: ENDARTERECTOMY FEMORAL;  Surgeon: Algernon Huxley, MD;  Location: [facility] ORS;  Service: Vascular;  Laterality: Left;  common and produndis   . ENDOVASCULAR REPAIR/STENT GRAFT Right 08/25/2019   Procedure: ENDOVASCULAR REPAIR/STENT GRAFT;  Surgeon: Algernon Huxley, MD;  Location: [facility] ORS;  Service: Vascular;  Laterality: Right;  renal artery  . HAND SURGERY     right 1993; left 2005  .  HERNIA REPAIR  1955  . INSERT / REPLACE / REMOVE PACEMAKER    . INSERTION OF ILIAC STENT Bilateral 08/25/2019   Procedure: INSERTION OF ILIAC STENT;  Surgeon: Algernon Huxley, MD;  Location: [facility] ORS;  Service: Vascular;  Laterality: Bilateral;  . JOINT REPLACEMENT Left 2013   knee  . JOINT REPLACEMENT Right 2004   knee  . KNEE SURGERY     left knee 1991 and 1992; right knee 1995  . LEFT HEART CATHETERIZATION WITH CORONARY ANGIOGRAM N/A 08/26/2014   Procedure: LEFT HEART CATHETERIZATION WITH CORONARY ANGIOGRAM;  Surgeon: [name], MD;  Location: [facility] CATH LAB;  Service: Cardiovascular;  Laterality: N/A;  . LOWER EXTREMITY ANGIOGRAPHY Left 08/23/2019   Procedure: Lower Extremity Angiography;  Surgeon: Algernon Huxley, MD;  Location: [place] CV LAB;  Service: Cardiovascular;  Laterality: Left;  . LUMBAR LAMINECTOMY/DECOMPRESSION MICRODISCECTOMY N/A 06/07/2014   Procedure: LUMBAR FOUR TO FIVE LUMBAR LAMINECTOMY/DECOMPRESSION MICRODISCECTOMY 1 LEVEL;  Surgeon: [name], MD;  Location: [place] NEURO ORS;  Service: Neurosurgery;  Laterality: N/A;  . LUNG BIOPSY Right 2016   Dr [name]  . MOHS SURGERY    . PACEMAKER INSERTION     PPM-- St Jude 11/30/10 by [name]  . PPM GENERATOR CHANGEOUT N/A 07/09/2019   Procedure: PPM GENERATOR CHANGEOUT;  Surgeon: Deboraha Sprang, MD;  Location: [place] CV LAB;  Service: Cardiovascular;  Laterality: N/A;  . PROSTATE SURGERY     cancer--1998, prostatectomy  . REPLACEMENT TOTAL KNEE     2004  . RIGHT/LEFT HEART CATH AND CORONARY ANGIOGRAPHY Bilateral 04/13/2021   Procedure: RIGHT/LEFT HEART CATH AND CORONARY ANGIOGRAPHY;  Surgeon: Wellington Hampshire, MD;  Location: [place] CV LAB;  Service: Cardiovascular;  Laterality: Bilateral;  . ruptured disc     1962 and 1998  . TEE WITHOUT CARDIOVERSION N/A 09/01/2019   Procedure: TRANSESOPHAGEAL ECHOCARDIOGRAM (TEE);  Surgeon: [name], MD;  Location: [facility] ORS;  Service: Cardiovascular;  Laterality: N/A;  .  TEMPORARY PACEMAKER N/A 07/09/2019   Procedure: TEMPORARY PACEMAKER;  Surgeon: Deboraha Sprang, MD;  Location: [place] CV LAB;  Service: Cardiovascular;  Laterality: N/A;  . TRIGGER FINGER RELEASE  01-24-15  . WOUND DEBRIDEMENT Left 06/07/2015   Procedure: DEBRIDEMENT WOUND;  Surgeon: Robert Bellow, MD;  Location: [facility] ORS;  Service: General;  Laterality: Left;  left upper back    Allergies  Allergies  Allergen Reactions  . Sulfa Antibiotics Rash    History of Present Illness    Howard [name] is a 87 y.o. male with history of chronic combined systolic and diastolic heart failure with newly reduced EF (03/2021 EF 20-25%, asymmetric LVH / G2DD), NICM, nonobstructive CAD (LHC 03/2021), WPW s/p radiofrequency catheter ablation and subsequent development of bundle branch block and complete heart block requiring PPM 2011 with generator change out 06/2019 secondary to ERI, history of 2020 splenic artery infarct  now on chronic OAC, PFO / positive bubble study (echo 2020), lung carcinoma (recently diagnosed) with upcoming chemotherapy treatment by oncology, PAD s/p interventions as below (including AAA s/p endovascular stent graft, left common femoral endarterectomy, bilateral external iliac artery stenting, left SFA stenting, right renal artery stenting), mild MR with degenerative valve/aortic sclerosis (03/2021), DM2, hypertension, hyperlipidemia, Bell's palsy, prostate cancer, vertigo, chronic back pain s/p multiple surgeries, sleep apnea, and who presents today for follow-up after recent catheterization.  He was previously evaluated at [facility] for WPW and underwent radiofrequency catheter ablation with subsequent development of bundle branch block and complete heart block, requiring pacemaker implantation in 2011.  He is s/p generator change out 06/2019 secondary to ERI.  He follows with Dr. Caryl Comes of [facility].  07/2014 echo EF 35 to 40%.  Diagnostic cardiac cath in 2015 was performed in the setting of abnormal  stress test with nonobstructive disease.  EF subsequently improved to 45 to 50% in 2016, followed by reduction in EF to 40% in 2019.  Myoview 07/2018 was without significant ischemia with attenuation corrected images showing moderate size region of fixed defect mid to apical inferior wall and apical region, EF 33%.  Overall, MPI ruled moderate low risk, given low EF and size of fixed defect.  06/2019, he was hospitalized with a splenic infarct and has since been treated with anticoagulation at the direction of Dr. Lucky Cowboy of [facility].  There is concern for cardioembolic source and atrial fibrillation at that time, though it is not shown on device review.  09/01/2019 echo showed LVEF 50 to 55%, no significant valvular abnormalities, and evidence of atrial shunting with bubble study positive for PFO.  08/2019, he underwent AAA endovascular stent graft.  Previously, he had PAD intervention, including left femoral endarterectomy, left SFA stent, right renal artery stent, and bilateral external iliac artery stenting.  Seen 03/2020 with lower extremity edema in the day, improved by morning.  No edema noted in clinic.  Elevation of lower extremities, low-salt diet, compression stockings recommended.  04/02/2020, he contacted his pulmonologist with recommendation for chest CT, which showed enlargement of right lower lobe nodule, as well as lymph nodes consistent with enlarging primary lung malignancy and new metastatic disease.  04/05/2021 echo with LVEF 20 to 25%, LV global hypokinesis, mild asymmetric LVH, G2 DD, RVSF moderately reduced.  Mitral valve was degenerative with mild MR.  Aortic valve was tricuspid with mild to moderate sclerosis without stenosis.  IVC was dilated with estimated RAP 15 mmHg.  Mild asymmetric LVH was noted.  04/12/21 PET scan showed 2.7 x 2.0 cm hypermetabolic metabolic right lower lobe pulmonary nodule with bilateral mediastinal lymphadenopathy.  Findings were concerning for primary bronchogenic  carcinoma.  Also noted was aortic atherosclerosis and left main/three-vessel coronary artery disease.  Calcifications of the aortic valve and mitral valve/annulus were noted.  Echo correlation was recommended for evaluation of potential valvular dysfunction if clinically indicated.  04/13/2021 right and left heart catheterization was performed with mild to moderate three-vessel coronary artery disease with severe calcifications.  No obstructive lesions were noted.  Specifically, pRCA was 30%s, dRCA 50%s.  pLAD to mLAD 20%s.  Ostial circumflex to proximal circumflex 30%s.  First marginal lesion 40%s and second diagonal lesion 60%s.  Recommendations were for aggressive medical therapy.  During catheterization, he was not felt to be volume overloaded.  Today, 04/23/2021, he returns to clinic and is joined by his son.  He has been doing well since the catheterization.  He denies any pain or  swelling at his right arteriotomy site, examined today and with arteriotomy site stable.  He denies any chest pain. He reports waking up at 4am-5am at times with SOB that required him to use his albuterol, so he switched to taking his albuterol before bed. With this change in his albuterol timing, he has noted improved breathing overnight. At times, and during the day, the patient's son notices it seems like the patient is working hard to breathe. Also reported are low O2 saturations with pulse ox at times (taken by pt at home).  No racing heart rate, palpitations, presyncope, or syncope.  No LEE or abdominal distention.  No reported melena or BRBPR; however, he is unable to tell me today if his urine is red, due to a red night light installed in the toilet to help him find the toilet at night. His son will switch the color of the toilet night light, and the pt will call the office if he notices any hematuria. The patient reports he is starting chemotherapy for his lung cancer with Dr. Tasia Catchings in 1 week and to occur at 10:15 am on 5 days  per week.  As below, lab work obtained via oncology was discussed to help consolidate his labs. Medication compliance reported. Catheterization and echo reports reviewed today and questions answered. Of note, the pt does have access to both a home BP cuff and pulse ox.   Home Medications    Current Outpatient Medications on File Prior to Visit  Medication Sig Dispense Refill  . acetaminophen (TYLENOL) 500 MG tablet Take 1,000 mg by mouth every 6 (six) hours as needed for moderate pain.    Marland Kitchen albuterol (VENTOLIN HFA) 108 (90 Base) MCG/ACT inhaler Inhale 2 puffs into the lungs every 6 (six) hours as needed for wheezing or shortness of breath. 8 g 2  . aspirin EC 81 MG tablet Take 81 mg by mouth daily.    . cetirizine (ZYRTEC) 10 MG tablet Take 10 mg by mouth daily.    . Cholecalciferol (VITAMIN D3) 1000 UNITS CAPS Take 1,000 Units by mouth daily.    Marland Kitchen donepezil (ARICEPT) 5 MG tablet Take 5 mg by mouth daily.    Marland Kitchen ELIQUIS 5 MG TABS tablet TAKE 1 TABLET TWICE A DAY  (SWITCHED FROM PLAVIX) 60 tablet 11  . ENTRESTO 24-26 MG TAKE 1 TABLET TWICE A DAY 90 tablet 3  . ezetimibe (ZETIA) 10 MG tablet TAKE 1 TABLET DAILY 90 tablet 2  . Fluticasone-Umeclidin-Vilant (TRELEGY ELLIPTA) 100-62.5-25 MCG/INH AEPB Inhale 1 puff into the lungs daily. 180 each 3  . furosemide (LASIX) 20 MG tablet Take 1 tablet (20 mg total) by mouth daily. 30 tablet 5  . Iron-Vitamin C (VITRON-C) 65-125 MG TABS Take 1 tablet by mouth daily. 30 tablet 2  . isosorbide mononitrate (IMDUR) 30 MG 24 hr tablet TAKE 1 TABLET DAILY 90 tablet 2  . metFORMIN (GLUCOPHAGE) 500 MG tablet Take 1 tablet (500 mg total) by mouth 2 (two) times daily with a meal. 180 tablet 1  . metoprolol succinate (TOPROL-XL) 25 MG 24 hr tablet TAKE 1 TABLET AT BEDTIME 90 tablet 2  . montelukast (SINGULAIR) 10 MG tablet Take 10 mg by mouth daily as needed (sneezing/allergies.).    Marland Kitchen Multiple Vitamin (MULTIVITAMIN WITH MINERALS) TABS tablet Take 1 tablet by mouth  daily. One-A-Day Multivitamin    . omeprazole (PRILOSEC) 40 MG capsule TAKE 1 CAPSULE TWICE DAILY AS NEEDED 180 capsule 3  . simvastatin (ZOCOR) 40 MG tablet  TAKE 1 TABLET AT BEDTIME 90 tablet 3  . spironolactone (ALDACTONE) 25 MG tablet TAKE 1 TABLET DAILY (DOSE  DECREASE) 90 tablet 1   No current facility-administered medications on file prior to visit.    Review of Systems    He denies chest pain, palpitations, orthopnea, n, v, dizziness, syncope, edema, weight gain, or early satiety.  No reported signs or symptoms of bleeding.   He has work of breathing noted by son.  He wakes up less at night due to shortness of breath  (improved overnight breathing) since changing his albuterol regimen.  All other systems reviewed and are otherwise negative except as noted above.  Physical Exam    VS:  BP 138/74 (BP Location: Right Arm, Patient Position: Sitting, Cuff Size: Normal)   Pulse 69   Ht 5\' 9"  (1.753 m)   Wt 189 lb (85.7 kg)   SpO2 95%   BMI 27.91 kg/m  , BMI Body mass index is 27.91 kg/m. GEN: Elderly male, seated in wheelchair.  Facemask in place.  Joined by his son. HEENT: normal. Neck: Supple, no JVD, carotid bruits, or masses. Cardiac: RRR, 1/6 systolic murmur RUSB and along the LLSB. No rubs or gallops. No clubbing, cyanosis, edema.  Radials/DP/PT 2+ and equal bilaterally.  Right radial arteriotomy site without any erythema, hematoma, or signs of infection. Respiratory:  Respirations regular and unlabored, clear to auscultation bilaterally. GI: Soft, nontender, nondistended, BS + x 4. MS: no deformity or atrophy. Skin: warm and dry, no rash. Neuro:  Strength and sensation are intact. Psych: Normal affect.  Accessory Clinical Findings    ECG personally reviewed by me today -atrial sensed ventricular paced rhythm, 69 bpm, PR interval 2 8 ms, QRS 196 ms, QTC 544 ms- no acute changes.  VITALS Reviewed today   Temp Readings from Last 3 Encounters:  04/16/21 98.6 F (37 C)   04/13/21 98.2 F (36.8 C) (Oral)  04/06/21 (!) 96.2 F (35.7 C) (Tympanic)   BP Readings from Last 3 Encounters:  04/23/21 138/74  04/16/21 127/68  04/13/21 (!) 140/59   Pulse Readings from Last 3 Encounters:  04/23/21 69  04/16/21 69  04/13/21 80    Wt Readings from Last 3 Encounters:  04/23/21 189 lb (85.7 kg)  04/16/21 193 lb 14.4 oz (88 kg)  04/09/21 195 lb (88.5 kg)     LABS  reviewed today   Lab Results  Component Value Date   WBC 8.7 04/09/2021   HGB 10.6 (L) 04/09/2021   HCT 34.1 (L) 04/09/2021   MCV 78.2 (L) 04/09/2021   PLT 355 04/09/2021   Lab Results  Component Value Date   CREATININE 1.16 04/09/2021   BUN 28 (H) 04/09/2021   NA 138 04/09/2021   K 4.3 04/09/2021   CL 107 04/09/2021   CO2 23 04/09/2021   Lab Results  Component Value Date   ALT 13 04/16/2021   AST 22 04/16/2021   ALKPHOS 48 04/16/2021   BILITOT 0.8 04/16/2021   Lab Results  Component Value Date   CHOL 130 11/13/2020   HDL 43 11/13/2020   LDLCALC 63 11/13/2020   TRIG 137 11/13/2020   CHOLHDL 3.0 11/13/2020    Lab Results  Component Value Date   HGBA1C 7.7 (A) 11/09/2020   Lab Results  Component Value Date   TSH 1.890 05/08/2020     STUDIES/PROCEDURES reviewed today   [facility] 04/13/21 Diagnostic Dominance: Right      Prox RCA lesion is 30% stenosed.  Dist RCA lesion is 50% stenosed.  Prox LAD to Mid LAD lesion is 20% stenosed.  Ost Cx to Prox Cx lesion is 30% stenosed.  1st Mrg lesion is 40% stenosed.  2nd Diag lesion is 60% stenosed.   1.  Mild to moderate three-vessel coronary artery disease with severe calcifications.  No obstructive lesions. 2.  Left ventricular angiography was not performed.  EF was severely reduced by echo. 3.  Right heart catheterization showed minimally elevated filling pressures, mild pulmonary hypertension and normal cardiac output.  Recommendations: The patient has nonischemic cardiomyopathy. Continue aggressive medical  therapy. Volume status appears good.  CT chest 04/02/2021 IMPRESSION: 1. Interval enlargement of a spiculated nodule of the right lower lobe, measuring 2.5 x 2.1 cm, previously 1.7 x 1.3 cm. 2. Interval enlargement of right hilar, subcarinal, pretracheal, and superior right paratracheal lymph nodes, largest subcarinal node measuring 4.0 x 2.2 cm, previously 2.8 x 0.9 cm. 3. Findings are consistent with enlarging primary lung malignancy and new nodal metastatic disease. 4. Coronary artery disease.  Aortic Atherosclerosis (ICD10-I70.0) and Emphysema (ICD10-J43.9).  Echo 04/05/2021 1. Left ventricular ejection fraction, by estimation, is 20 to 25%. The  left ventricle has severely decreased function. The left ventricle  demonstrates global hypokinesis. There is mild asymmetric left ventricular  hypertrophy. Left ventricular diastolic  parameters are consistent with Grade II diastolic dysfunction  (pseudonormalization). The average left ventricular global longitudinal  strain is -4.6 %. The global longitudinal strain is abnormal.  2. Right ventricular systolic function is moderately reduced. The right  ventricular size is normal.  3. The mitral valve is degenerative. Mild mitral valve regurgitation.  4. The aortic valve is tricuspid. Aortic valve regurgitation is not  visualized. Mild to moderate aortic valve sclerosis/calcification is  present, without any evidence of aortic stenosis.  5. The inferior vena cava is dilated in size with <50% respiratory  variability, suggesting right atrial pressure of 15 mmHg.   Assessment & Plan    Chronic HFrEF (EF 20-25%), G2DD Nonischemic Cardiomyopathy  --Denies s/sx of worsening volume status.  SOB/low oxygen saturations considered in the setting of current lung CA as below. Euvolemic and well compensated on exam.  Right radial arteriotomy site stable.  He has a long history of nonischemic cardiomyopathy.  Echo 04/05/2021 with reduced LVEF  20 to 25%, mild asymmetric LVH, G2DD, RAP 15 mmHg.  Started on Lasix 20 mg daily with wt down from previous clinic visit and volume status improved by 4/22 cath. LHC without obstructive disease.  Optimization of medical management recommended at this time.   Increase Entresto dose to 49-51 BID today, given room in BP.    Labs: BMET, CBC --> I will reach out to patient's oncologist to obtain a BMET via oncology in the next few days (and hopefully consolidate labs between the 2 specialties).    Continue Lasix 20 mg daily, spironolactone 25mg  daily, Toprol 25mg  qdl.  Cardiac rehab recommended - will check on this referral order and place repeat order if needed.  Repeat echo in 3 to 6 months to reassess EF s/p optimal GDMT.  Valvular disease --Seen on echo 04/05/21 and noted on recent PET scan. Mild to moderate aortic valve sclerosis / calcification noted on echo without any evidence of stenosis, as well as mild mitral regurgitation with degenerative valve. Continue to monitor with periodic echo as indicated. Control of heart rate and blood pressure recommended.   Nonobstructive CAD -- No symptoms concerning for angina.  Recent catheterization 03/2021 as above  without obstructive CAD. EF 20-25% 2/2 NICM with recommendation for optimization of medical management as above.  Continue current medications including ASA, BB, and statin.  Aggressive risk factor modification recommended, including LDL control below 70. Ongoing glycemic control recommended.   Lung cancer / bronchogenic lung carcinoma -- Right lower lobe nodule and lymph node involvement with oncology starting chemotherapy treatment next week.  Continue to follow with oncology as recommended. As above, will reach out to oncology to coordinate our labs moving forward.  CHB s/p PPM -- Previously seen at [facility] for WPW and underwent radiofrequency catheter ablation with subsequent development of bundle branch block and complete heart block,  requiring pacemaker implantation in 2011.  He is s/p generator change out 06/2019 secondary to ER. Device has been used to monitor for any atrial arrhythmia after his splenic infarct as above. Continue to follow with Dr. Klein/electrophysiology as directed. Continue periodic device checks as scheduled.   Hypertension, BP goal <130/80 -- BP 138/74 with SBP sub-optimal and plan to increase to Entresto 49-51 BID today per GDMT and for additional BP support.  Continue Entresto with Lasix, Imdur, Toprol, and spironolactone.  He will start to monitor his blood pressure at home, given the medication changes as above.  HLD, LDL goal <70 --10/2020 LDL 63 and at goal. Continue statin and Zetia with LDL goal below 70.  Splenic infarct/PFO/chronic anticoagulation --TEE, performed 08/2019 after admission for splenic infarct, and showing no evidence of left atrial thrombus or left atrial appendage thrombus.  Subsequent bubble study positive for PFO.  Continue indefinite anticoagulation with close monitoring for any signs or symptoms of bleeding each visit.  Continue to follow with vascular surgery as directed.    PAD: AAA s/p endovascular repair, R renal artery stent, L SFA stent, L femoral endarterecomy, B/l external iliac stenting -- S/p bilateral external iliac stenting, left SFA stenting, right renal artery stenting, AAA graft repair, left common femoral endarterectomy per vascular surgery.  Continue to follow with vascular surgery.  Continue ASA, statin, and Zetia.  Aggressive risk factor modification with recommendation for LDL below 70 (and previous 10/2020 LDL well controlled at 63).  Medication changes: Increase to Entresto 49-51 BID. Start Farxiga 10mg  qd. Labs ordered: Reach out to Dr. Tasia Catchings about getting CBC and BMET. BMET at RTC. Studies / Imaging/ Referrals: Ensure cardiac rehab referral placed Future considerations: Echo to reassess EF s/p optimization of medical therapy  Disposition: RTC 1 month (BMET  then)  *Please be aware that the above documentation was completed voice recognition software and may contain dictation errors.     Arvil Chaco, PA-C 04/23/2021

## 2021-04-23 NOTE — Patient Instructions (Addendum)
Medication Instructions:  Your physician has recommended you make the following change in your medication:   INCREASE Entresto 49/51mg  TWICE daily  START Farxiga 10mg  DAILY  *If you need a refill on your cardiac medications before your next appointment, please call your pharmacy*   Lab Work:  None ordered   Testing/Procedures: None ordered   Follow-Up: At [facility], you and your health needs are our priority.  As part of our continuing mission to provide you with exceptional heart care, we have created designated Provider Care Teams.  These Care Teams include your primary Cardiologist (physician) and Advanced Practice Providers (APPs -  Physician Assistants and Nurse Practitioners) who all work together to provide you with the care you need, when you need it.  We recommend signing up for the patient portal called "MyChart".  Sign up information is provided on this After Visit Summary.  MyChart is used to connect with patients for Virtual Visits (Telemedicine).  Patients are able to view lab/test results, encounter notes, upcoming appointments, etc.  Non-urgent messages can be sent to your provider as well.   To learn more about what you can do with MyChart, go to [email or web address].    Your next appointment:   1 month(s)  The format for your next appointment:   In Person  Provider:   You may see [name], MD or one of the following Advanced Practice Providers on your designated Care Team:     Marrianne Mood, [place]    Other Instructions  Please call the office if you notice red urine after you turn off the light.

## 2021-04-24 ENCOUNTER — Ambulatory Visit: Payer: Medicare Other | Admitting: Physical Therapy

## 2021-04-24 ENCOUNTER — Encounter: Payer: Medicare Other | Admitting: Speech Pathology

## 2021-04-25 ENCOUNTER — Inpatient Hospital Stay (HOSPITAL_BASED_OUTPATIENT_CLINIC_OR_DEPARTMENT_OTHER): Payer: Medicare Other | Admitting: Hospice and Palliative Medicine

## 2021-04-25 ENCOUNTER — Other Ambulatory Visit: Payer: Self-pay | Admitting: *Deleted

## 2021-04-25 ENCOUNTER — Other Ambulatory Visit: Payer: Self-pay

## 2021-04-25 DIAGNOSIS — R918 Other nonspecific abnormal finding of lung field: Secondary | ICD-10-CM

## 2021-04-25 DIAGNOSIS — C3411 Malignant neoplasm of upper lobe, right bronchus or lung: Secondary | ICD-10-CM | POA: Insufficient documentation

## 2021-04-25 NOTE — Progress Notes (Signed)
Multidisciplinary Oncology Council Documentation  Howard [name] was presented by our [facility] on 04/25/2021, which included representatives from:  . Palliative Care . Dietitian  . Physical/Occupational Therapist . Nurse Navigator . Genetics . Speech Therapist . Social work . Survivorship RN . Hotel manager . Research RN . [place] currently presents with history of recurrent stage III lung cancer  We reviewed previous medical and familial history, history of present illness, and recent lab results along with all available histopathologic and imaging studies. The [place] considered available treatment options and made the following recommendations/referrals:  Recommend rehab screening, palliative care, and nutrition   The MOC is a meeting of clinicians from various specialty areas who evaluate and discuss patients for whom a multidisciplinary approach is being considered. Final determinations in the plan of care are those of the provider(s).   Today's extended care, comprehensive team conference, Howard [name] was not present for the discussion and was not examined.

## 2021-04-26 ENCOUNTER — Encounter: Payer: Medicare Other | Admitting: Speech Pathology

## 2021-04-26 ENCOUNTER — Ambulatory Visit: Admission: RE | Admit: 2021-04-26 | Payer: Medicare Other | Source: Ambulatory Visit

## 2021-04-26 ENCOUNTER — Ambulatory Visit
Admission: RE | Admit: 2021-04-26 | Discharge: 2021-04-26 | Disposition: A | Discharge status: Home / Self Care | Payer: Medicare Other | Source: Ambulatory Visit | Attending: Radiation Oncology | Admitting: Radiation Oncology

## 2021-04-26 ENCOUNTER — Ambulatory Visit: Payer: Medicare Other

## 2021-04-27 ENCOUNTER — Other Ambulatory Visit: Payer: Self-pay | Admitting: *Deleted

## 2021-04-27 DIAGNOSIS — F17210 Nicotine dependence, cigarettes, uncomplicated: Secondary | ICD-10-CM | POA: Diagnosis not present

## 2021-04-27 DIAGNOSIS — C3411 Malignant neoplasm of upper lobe, right bronchus or lung: Secondary | ICD-10-CM | POA: Diagnosis not present

## 2021-04-27 DIAGNOSIS — C3490 Malignant neoplasm of unspecified part of unspecified bronchus or lung: Secondary | ICD-10-CM

## 2021-04-30 ENCOUNTER — Ambulatory Visit
Admission: RE | Admit: 2021-04-30 | Discharge: 2021-04-30 | Disposition: A | Discharge status: Home / Self Care | Payer: Medicare Other | Source: Ambulatory Visit | Attending: Radiation Oncology | Admitting: Radiation Oncology

## 2021-04-30 DIAGNOSIS — F17210 Nicotine dependence, cigarettes, uncomplicated: Secondary | ICD-10-CM | POA: Diagnosis not present

## 2021-04-30 DIAGNOSIS — C3411 Malignant neoplasm of upper lobe, right bronchus or lung: Secondary | ICD-10-CM | POA: Diagnosis not present

## 2021-05-01 ENCOUNTER — Encounter: Payer: Medicare Other | Admitting: Speech Pathology

## 2021-05-01 ENCOUNTER — Ambulatory Visit
Admission: RE | Admit: 2021-05-01 | Discharge: 2021-05-01 | Disposition: A | Discharge status: Home / Self Care | Payer: Medicare Other | Source: Ambulatory Visit | Attending: Radiation Oncology | Admitting: Radiation Oncology

## 2021-05-01 DIAGNOSIS — F17210 Nicotine dependence, cigarettes, uncomplicated: Secondary | ICD-10-CM | POA: Diagnosis not present

## 2021-05-01 DIAGNOSIS — C3411 Malignant neoplasm of upper lobe, right bronchus or lung: Secondary | ICD-10-CM | POA: Diagnosis not present

## 2021-05-02 ENCOUNTER — Ambulatory Visit
Admission: RE | Admit: 2021-05-02 | Discharge: 2021-05-02 | Disposition: A | Discharge status: Home / Self Care | Payer: Medicare Other | Source: Ambulatory Visit | Attending: Radiation Oncology | Admitting: Radiation Oncology

## 2021-05-02 DIAGNOSIS — F17210 Nicotine dependence, cigarettes, uncomplicated: Secondary | ICD-10-CM | POA: Diagnosis not present

## 2021-05-02 DIAGNOSIS — C3411 Malignant neoplasm of upper lobe, right bronchus or lung: Secondary | ICD-10-CM | POA: Diagnosis not present

## 2021-05-03 ENCOUNTER — Ambulatory Visit: Payer: Medicare Other

## 2021-05-03 ENCOUNTER — Ambulatory Visit
Admission: RE | Admit: 2021-05-03 | Discharge: 2021-05-03 | Disposition: A | Discharge status: Home / Self Care | Payer: Medicare Other | Source: Ambulatory Visit | Attending: Radiation Oncology | Admitting: Radiation Oncology

## 2021-05-03 ENCOUNTER — Encounter: Payer: Medicare Other | Admitting: Speech Pathology

## 2021-05-03 DIAGNOSIS — C3411 Malignant neoplasm of upper lobe, right bronchus or lung: Secondary | ICD-10-CM | POA: Diagnosis not present

## 2021-05-03 DIAGNOSIS — F17210 Nicotine dependence, cigarettes, uncomplicated: Secondary | ICD-10-CM | POA: Diagnosis not present

## 2021-05-04 ENCOUNTER — Ambulatory Visit
Admission: RE | Admit: 2021-05-04 | Discharge: 2021-05-04 | Disposition: A | Discharge status: Home / Self Care | Payer: Medicare Other | Source: Ambulatory Visit | Attending: Radiation Oncology | Admitting: Radiation Oncology

## 2021-05-04 DIAGNOSIS — F17210 Nicotine dependence, cigarettes, uncomplicated: Secondary | ICD-10-CM | POA: Diagnosis not present

## 2021-05-04 DIAGNOSIS — C3411 Malignant neoplasm of upper lobe, right bronchus or lung: Secondary | ICD-10-CM | POA: Diagnosis not present

## 2021-05-07 ENCOUNTER — Ambulatory Visit: Payer: Medicare Other

## 2021-05-07 ENCOUNTER — Ambulatory Visit
Admission: RE | Admit: 2021-05-07 | Discharge: 2021-05-07 | Disposition: A | Discharge status: Home / Self Care | Payer: Medicare Other | Source: Ambulatory Visit | Attending: Radiation Oncology | Admitting: Radiation Oncology

## 2021-05-07 ENCOUNTER — Encounter: Payer: Medicare Other | Admitting: Speech Pathology

## 2021-05-07 DIAGNOSIS — C3411 Malignant neoplasm of upper lobe, right bronchus or lung: Secondary | ICD-10-CM | POA: Diagnosis not present

## 2021-05-07 DIAGNOSIS — F17210 Nicotine dependence, cigarettes, uncomplicated: Secondary | ICD-10-CM | POA: Diagnosis not present

## 2021-05-08 ENCOUNTER — Ambulatory Visit
Admission: RE | Admit: 2021-05-08 | Discharge: 2021-05-08 | Disposition: A | Discharge status: Home / Self Care | Payer: Medicare Other | Source: Ambulatory Visit | Attending: Radiation Oncology | Admitting: Radiation Oncology

## 2021-05-08 DIAGNOSIS — C3411 Malignant neoplasm of upper lobe, right bronchus or lung: Secondary | ICD-10-CM | POA: Diagnosis not present

## 2021-05-08 DIAGNOSIS — F17210 Nicotine dependence, cigarettes, uncomplicated: Secondary | ICD-10-CM | POA: Diagnosis not present

## 2021-05-09 ENCOUNTER — Ambulatory Visit
Admission: RE | Admit: 2021-05-09 | Discharge: 2021-05-09 | Disposition: A | Discharge status: Home / Self Care | Payer: Medicare Other | Source: Ambulatory Visit | Attending: Radiation Oncology | Admitting: Radiation Oncology

## 2021-05-09 DIAGNOSIS — F17210 Nicotine dependence, cigarettes, uncomplicated: Secondary | ICD-10-CM | POA: Diagnosis not present

## 2021-05-09 DIAGNOSIS — C3411 Malignant neoplasm of upper lobe, right bronchus or lung: Secondary | ICD-10-CM | POA: Diagnosis not present

## 2021-05-10 ENCOUNTER — Ambulatory Visit
Admission: RE | Admit: 2021-05-10 | Discharge: 2021-05-10 | Disposition: A | Discharge status: Home / Self Care | Payer: Medicare Other | Source: Ambulatory Visit | Attending: Radiation Oncology | Admitting: Radiation Oncology

## 2021-05-10 ENCOUNTER — Encounter: Payer: Medicare Other | Admitting: Speech Pathology

## 2021-05-10 ENCOUNTER — Ambulatory Visit: Payer: Medicare Other

## 2021-05-10 DIAGNOSIS — F17210 Nicotine dependence, cigarettes, uncomplicated: Secondary | ICD-10-CM | POA: Diagnosis not present

## 2021-05-10 DIAGNOSIS — C3411 Malignant neoplasm of upper lobe, right bronchus or lung: Secondary | ICD-10-CM | POA: Diagnosis not present

## 2021-05-11 ENCOUNTER — Ambulatory Visit
Admission: RE | Admit: 2021-05-11 | Discharge: 2021-05-11 | Disposition: A | Discharge status: Home / Self Care | Payer: Medicare Other | Source: Ambulatory Visit | Attending: Radiation Oncology | Admitting: Radiation Oncology

## 2021-05-11 DIAGNOSIS — C3411 Malignant neoplasm of upper lobe, right bronchus or lung: Secondary | ICD-10-CM | POA: Diagnosis not present

## 2021-05-11 DIAGNOSIS — F17210 Nicotine dependence, cigarettes, uncomplicated: Secondary | ICD-10-CM | POA: Diagnosis not present

## 2021-05-14 ENCOUNTER — Encounter: Payer: Medicare Other | Attending: Cardiovascular Disease | Admitting: *Deleted

## 2021-05-14 ENCOUNTER — Ambulatory Visit
Admission: RE | Admit: 2021-05-14 | Discharge: 2021-05-14 | Disposition: A | Discharge status: Home / Self Care | Payer: Medicare Other | Source: Ambulatory Visit | Attending: Radiation Oncology | Admitting: Radiation Oncology

## 2021-05-14 ENCOUNTER — Other Ambulatory Visit: Payer: Self-pay

## 2021-05-14 DIAGNOSIS — I5042 Chronic combined systolic (congestive) and diastolic (congestive) heart failure: Secondary | ICD-10-CM

## 2021-05-14 DIAGNOSIS — C3411 Malignant neoplasm of upper lobe, right bronchus or lung: Secondary | ICD-10-CM | POA: Diagnosis not present

## 2021-05-14 DIAGNOSIS — F17210 Nicotine dependence, cigarettes, uncomplicated: Secondary | ICD-10-CM | POA: Diagnosis not present

## 2021-05-14 NOTE — Progress Notes (Signed)
Initial telephone orientation completed. Diagnosis can be found in [facility] 5/2. EP orientation scheduled for Thursday 6/2 at 2pm.

## 2021-05-15 ENCOUNTER — Encounter: Payer: Medicare Other | Admitting: Speech Pathology

## 2021-05-15 ENCOUNTER — Ambulatory Visit: Payer: Medicare Other

## 2021-05-15 ENCOUNTER — Inpatient Hospital Stay: Payer: Medicare Other

## 2021-05-15 ENCOUNTER — Ambulatory Visit
Admission: RE | Admit: 2021-05-15 | Discharge: 2021-05-15 | Disposition: A | Discharge status: Home / Self Care | Payer: Medicare Other | Source: Ambulatory Visit | Attending: Radiation Oncology | Admitting: Radiation Oncology

## 2021-05-15 DIAGNOSIS — C3411 Malignant neoplasm of upper lobe, right bronchus or lung: Secondary | ICD-10-CM

## 2021-05-15 DIAGNOSIS — F17210 Nicotine dependence, cigarettes, uncomplicated: Secondary | ICD-10-CM | POA: Diagnosis not present

## 2021-05-15 DIAGNOSIS — C3490 Malignant neoplasm of unspecified part of unspecified bronchus or lung: Secondary | ICD-10-CM

## 2021-05-15 LAB — BASIC METABOLIC PANEL
Anion gap: 13 (ref 5–15)
BUN: 28 mg/dL — ABNORMAL HIGH (ref 8–23)
CO2: 19 mmol/L — ABNORMAL LOW (ref 22–32)
Calcium: 9.1 mg/dL (ref 8.9–10.3)
Chloride: 107 mmol/L (ref 98–111)
Creatinine, Ser: 1.34 mg/dL — ABNORMAL HIGH (ref 0.61–1.24)
GFR, Estimated: 51 mL/min — ABNORMAL LOW (ref >60)
Glucose, Bld: 238 mg/dL — ABNORMAL HIGH (ref 70–99)
Potassium: 4 mmol/L (ref 3.5–5.1)
Sodium: 139 mmol/L (ref 135–145)

## 2021-05-15 LAB — CBC
HCT: 35.3 % — ABNORMAL LOW (ref 39.0–52.0)
Hemoglobin: 11.2 g/dL — ABNORMAL LOW (ref 13.0–17.0)
MCH: 25.9 pg — ABNORMAL LOW (ref 26.0–34.0)
MCHC: 31.7 g/dL (ref 30.0–36.0)
MCV: 81.5 fL (ref 80.0–100.0)
Platelets: 186 10*3/uL (ref 150–400)
RBC: 4.33 MIL/uL (ref 4.22–5.81)
RDW: 19.5 % — ABNORMAL HIGH (ref 11.5–15.5)
WBC: 4.2 10*3/uL (ref 4.0–10.5)
nRBC: 0 % (ref 0.0–0.2)

## 2021-05-16 ENCOUNTER — Ambulatory Visit
Admission: RE | Admit: 2021-05-16 | Discharge: 2021-05-16 | Disposition: A | Discharge status: Home / Self Care | Payer: Medicare Other | Source: Ambulatory Visit | Attending: Radiation Oncology | Admitting: Radiation Oncology

## 2021-05-16 DIAGNOSIS — C3411 Malignant neoplasm of upper lobe, right bronchus or lung: Secondary | ICD-10-CM | POA: Diagnosis not present

## 2021-05-16 DIAGNOSIS — F17210 Nicotine dependence, cigarettes, uncomplicated: Secondary | ICD-10-CM | POA: Diagnosis not present

## 2021-05-17 ENCOUNTER — Ambulatory Visit: Payer: Medicare Other

## 2021-05-17 ENCOUNTER — Encounter: Payer: Medicare Other | Admitting: Speech Pathology

## 2021-05-17 ENCOUNTER — Telehealth: Payer: Self-pay

## 2021-05-17 ENCOUNTER — Ambulatory Visit
Admission: RE | Admit: 2021-05-17 | Discharge: 2021-05-17 | Disposition: A | Discharge status: Home / Self Care | Payer: Medicare Other | Source: Ambulatory Visit | Attending: Radiation Oncology | Admitting: Radiation Oncology

## 2021-05-17 DIAGNOSIS — F17210 Nicotine dependence, cigarettes, uncomplicated: Secondary | ICD-10-CM | POA: Diagnosis not present

## 2021-05-17 DIAGNOSIS — R944 Abnormal results of kidney function studies: Secondary | ICD-10-CM

## 2021-05-17 DIAGNOSIS — C3411 Malignant neoplasm of upper lobe, right bronchus or lung: Secondary | ICD-10-CM | POA: Diagnosis not present

## 2021-05-17 NOTE — Telephone Encounter (Signed)
Able to reach pt regarding his recent lab work, provider had a chance to review his results and advised   --Sugars elevated  --K at goal with consideration of sugars, which influence K+  --Renal function bumped from previous labs with start of Entresto   Recommendations:  --Ensure SBP over 100, as prerenal AKI can worsen renal function and we recently increased his Delene Loll  --[employer] - we may need to discontinue this medication  -- Given we recently increased his Entresto with Cr only mildly elevated at this time and high nl side of his usual range with stable BUN, no further changes. We will recheck BMET within the next week. Further recommendations if indicated at that time.   Howard [name] is thankful for the call, verbalized understanding, will have repeat labs in a week's time and will hold the [place] for now. He reports he ate and drank coffee before his last labs. Will wait until lab draw to eat and drink at next appt.   Will upload report and recommendations to his MyChart for his wife to review.   BMET order placed for [facility] mall.

## 2021-05-18 ENCOUNTER — Ambulatory Visit: Payer: Medicare Other

## 2021-05-18 NOTE — Telephone Encounter (Signed)
Patient spouse returning call Wants to clarify message given to husband yesterday Please call to discuss

## 2021-05-18 NOTE — Addendum Note (Signed)
Addended by: Valora Corporal on: 05/18/2021 09:39 AM   Modules accepted: Orders

## 2021-05-18 NOTE — Telephone Encounter (Addendum)
Spoke with patients wife per release form. She states her husband is very hard of hearing and she was unsure of results. Reviewed results in detail and instructions. She requested I send another My Chart message with more clear information so she can have for reference. She was very appreciative for the call, time, and review of instructions. She had no further questions at this time.

## 2021-05-22 ENCOUNTER — Encounter: Payer: Self-pay | Admitting: Cardiovascular Disease

## 2021-05-22 ENCOUNTER — Inpatient Hospital Stay: Payer: Medicare Other

## 2021-05-22 ENCOUNTER — Other Ambulatory Visit: Payer: Self-pay | Admitting: *Deleted

## 2021-05-22 ENCOUNTER — Ambulatory Visit
Admission: RE | Admit: 2021-05-22 | Discharge: 2021-05-22 | Disposition: A | Discharge status: Home / Self Care | Payer: Medicare Other | Source: Ambulatory Visit | Attending: Radiation Oncology | Admitting: Radiation Oncology

## 2021-05-22 ENCOUNTER — Encounter: Payer: Medicare Other | Admitting: Speech Pathology

## 2021-05-22 ENCOUNTER — Ambulatory Visit: Payer: Medicare Other

## 2021-05-22 DIAGNOSIS — F17210 Nicotine dependence, cigarettes, uncomplicated: Secondary | ICD-10-CM | POA: Diagnosis not present

## 2021-05-22 DIAGNOSIS — C3411 Malignant neoplasm of upper lobe, right bronchus or lung: Secondary | ICD-10-CM | POA: Diagnosis not present

## 2021-05-22 LAB — BASIC METABOLIC PANEL
Anion gap: 11 (ref 5–15)
BUN: 25 mg/dL — ABNORMAL HIGH (ref 8–23)
CO2: 19 mmol/L — ABNORMAL LOW (ref 22–32)
Calcium: 8.7 mg/dL — ABNORMAL LOW (ref 8.9–10.3)
Chloride: 108 mmol/L (ref 98–111)
Creatinine, Ser: 1.24 mg/dL (ref 0.61–1.24)
GFR, Estimated: 56 mL/min — ABNORMAL LOW (ref >60)
Glucose, Bld: 128 mg/dL — ABNORMAL HIGH (ref 70–99)
Potassium: 3.9 mmol/L (ref 3.5–5.1)
Sodium: 138 mmol/L (ref 135–145)

## 2021-05-22 LAB — CBC
HCT: 34.4 % — ABNORMAL LOW (ref 39.0–52.0)
Hemoglobin: 11.1 g/dL — ABNORMAL LOW (ref 13.0–17.0)
MCH: 26.2 pg (ref 26.0–34.0)
MCHC: 32.3 g/dL (ref 30.0–36.0)
MCV: 81.1 fL (ref 80.0–100.0)
Platelets: 155 10*3/uL (ref 150–400)
RBC: 4.24 MIL/uL (ref 4.22–5.81)
RDW: 19.6 % — ABNORMAL HIGH (ref 11.5–15.5)
WBC: 3.9 10*3/uL — ABNORMAL LOW (ref 4.0–10.5)
nRBC: 0 % (ref 0.0–0.2)

## 2021-05-22 NOTE — Telephone Encounter (Signed)
Patients wife calling in to make office aware that his lab work from the [facility] has been drawn on 5/31

## 2021-05-23 ENCOUNTER — Ambulatory Visit
Admission: RE | Admit: 2021-05-23 | Discharge: 2021-05-23 | Disposition: A | Discharge status: Home / Self Care | Payer: Medicare Other | Source: Ambulatory Visit | Attending: Radiation Oncology | Admitting: Radiation Oncology

## 2021-05-23 DIAGNOSIS — F17210 Nicotine dependence, cigarettes, uncomplicated: Secondary | ICD-10-CM | POA: Diagnosis not present

## 2021-05-23 DIAGNOSIS — E119 Type 2 diabetes mellitus without complications: Secondary | ICD-10-CM | POA: Diagnosis not present

## 2021-05-23 DIAGNOSIS — C3411 Malignant neoplasm of upper lobe, right bronchus or lung: Secondary | ICD-10-CM | POA: Diagnosis not present

## 2021-05-23 DIAGNOSIS — I5042 Chronic combined systolic (congestive) and diastolic (congestive) heart failure: Secondary | ICD-10-CM | POA: Diagnosis not present

## 2021-05-24 ENCOUNTER — Ambulatory Visit
Admission: RE | Admit: 2021-05-24 | Discharge: 2021-05-24 | Disposition: A | Discharge status: Home / Self Care | Payer: Medicare Other | Source: Ambulatory Visit | Attending: Radiation Oncology | Admitting: Radiation Oncology

## 2021-05-24 ENCOUNTER — Other Ambulatory Visit: Payer: Self-pay

## 2021-05-24 ENCOUNTER — Ambulatory Visit: Payer: Medicare Other

## 2021-05-24 ENCOUNTER — Encounter: Payer: Medicare Other | Admitting: Speech Pathology

## 2021-05-24 VITALS — Ht 69.5 in | Wt 190.0 lb

## 2021-05-24 DIAGNOSIS — E119 Type 2 diabetes mellitus without complications: Secondary | ICD-10-CM | POA: Diagnosis not present

## 2021-05-24 DIAGNOSIS — F17210 Nicotine dependence, cigarettes, uncomplicated: Secondary | ICD-10-CM | POA: Diagnosis not present

## 2021-05-24 DIAGNOSIS — I5042 Chronic combined systolic (congestive) and diastolic (congestive) heart failure: Secondary | ICD-10-CM | POA: Diagnosis not present

## 2021-05-24 DIAGNOSIS — C3411 Malignant neoplasm of upper lobe, right bronchus or lung: Secondary | ICD-10-CM | POA: Diagnosis not present

## 2021-05-24 NOTE — Progress Notes (Signed)
Cardiac Individual Treatment Plan  Patient Details  Name: Howard [name] MRN: [id number] Date of Birth: 1931-01-09 Referring Provider:   Flowsheet Row Cardiac Rehab from 05/24/2021 in [facility] Cardiac and Pulmonary Rehab  Referring Provider Gollan      Initial Encounter Date:  Flowsheet Row Cardiac Rehab from 05/24/2021 in [facility] Cardiac and Pulmonary Rehab  Date 05/24/21      Visit Diagnosis: Heart failure, systolic and diastolic, chronic ([place])  Patient's Home Medications on Admission:  Current Outpatient Medications:  .  acetaminophen (TYLENOL) 500 MG tablet, Take 1,000 mg by mouth every 6 (six) hours as needed for moderate pain., Disp: , Rfl:  .  albuterol (VENTOLIN HFA) 108 (90 Base) MCG/ACT inhaler, Inhale 2 puffs into the lungs every 6 (six) hours as needed for wheezing or shortness of breath., Disp: 8 g, Rfl: 2 .  aspirin EC 81 MG tablet, Take 81 mg by mouth daily., Disp: , Rfl:  .  cetirizine (ZYRTEC) 10 MG tablet, Take 10 mg by mouth daily., Disp: , Rfl:  .  Cholecalciferol (VITAMIN D3) 1000 UNITS CAPS, Take 1,000 Units by mouth daily., Disp: , Rfl:  .  dapagliflozin propanediol (FARXIGA) 10 MG TABS tablet, Take 1 tablet (10 mg total) by mouth daily before breakfast., Disp: 30 tablet, Rfl: 3 .  donepezil (ARICEPT) 5 MG tablet, Take 5 mg by mouth daily., Disp: , Rfl:  .  ELIQUIS 5 MG TABS tablet, TAKE 1 TABLET TWICE A DAY  (SWITCHED FROM PLAVIX), Disp: 60 tablet, Rfl: 11 .  ezetimibe (ZETIA) 10 MG tablet, TAKE 1 TABLET DAILY, Disp: 90 tablet, Rfl: 2 .  Fluticasone-Umeclidin-Vilant (TRELEGY ELLIPTA) 100-62.5-25 MCG/INH AEPB, Inhale 1 puff into the lungs daily., Disp: 180 each, Rfl: 3 .  furosemide (LASIX) 20 MG tablet, Take 1 tablet (20 mg total) by mouth daily., Disp: 30 tablet, Rfl: 5 .  Iron-Vitamin C (VITRON-C) 65-125 MG TABS, Take 1 tablet by mouth daily., Disp: 30 tablet, Rfl: 2 .  isosorbide mononitrate (IMDUR) 30 MG 24 hr tablet, TAKE 1 TABLET DAILY, Disp: 90 tablet, Rfl: 2 .   metFORMIN (GLUCOPHAGE) 500 MG tablet, Take 1 tablet (500 mg total) by mouth 2 (two) times daily with a meal., Disp: 180 tablet, Rfl: 1 .  metoprolol succinate (TOPROL-XL) 25 MG 24 hr tablet, TAKE 1 TABLET AT BEDTIME, Disp: 90 tablet, Rfl: 2 .  montelukast (SINGULAIR) 10 MG tablet, Take 10 mg by mouth daily as needed (sneezing/allergies.)., Disp: , Rfl:  .  Multiple Vitamin (MULTIVITAMIN WITH MINERALS) TABS tablet, Take 1 tablet by mouth daily. One-A-Day Multivitamin, Disp: , Rfl:  .  omeprazole (PRILOSEC) 40 MG capsule, TAKE 1 CAPSULE TWICE DAILY AS NEEDED, Disp: 180 capsule, Rfl: 3 .  sacubitril-valsartan (ENTRESTO) 49-51 MG, Take 1 tablet by mouth 2 (two) times daily., Disp: 60 tablet, Rfl: 3 .  simvastatin (ZOCOR) 40 MG tablet, TAKE 1 TABLET AT BEDTIME, Disp: 90 tablet, Rfl: 3 .  spironolactone (ALDACTONE) 25 MG tablet, Take 1 tablet (25 mg total) by mouth daily., Disp: 90 tablet, Rfl: 3  Past Medical History: Past Medical History:  Diagnosis Date  . Arthritis   . Bell palsy   . Bell's palsy 04/12/2015  . Cancer [facility])    prostate and skin  . Chronic combined systolic and diastolic CHF, NYHA class 1 ([place])    a. 07/2014 Echo: EF 35-40%, Gr 1 DD.  Marland Kitchen Complete heart block ([place])    a. 11/2010 s/p SJM 2210 [facility] DC PPM, ser# [id number].  Marland Kitchen  Depression   . Diabetes mellitus without complication ([place])   . Fall 11-10-14  . GERD (gastroesophageal reflux disease)   . History of prostate cancer   . Hyperlipidemia   . Hypertension   . LBBB (left bundle branch block)   . Left-sided Bell's palsy   . Lung cancer ([place]) 2016  . NICM (nonischemic cardiomyopathy) ([place])    a. 07/2014 Echo: EF 35-40%, mid-apicalanteroseptal DK, Gr 1 DD, mild-mod dil LA.  . Non-obstructive CAD    a. 07/2014 Abnl MV;  b. 08/2014 Cath: LM nl, LAD 30p, RI 40p, LCX nl, OM1 40, RCA dominant 30p, 70d-->Med Rx.  Marland Kitchen Poor balance   . Presence of permanent cardiac pacemaker   . Sleep apnea    a. cpap  . Vertigo   . WPW  (Wolff-Parkinson-White syndrome)    a. S/P RFCA 1991.    Tobacco Use: Social History   Tobacco Use  Smoking Status Former Smoker  . Packs/day: 1.00  . Years: 56.00  . Pack years: 56.00  . Types: Cigarettes  . Quit date: 2011  . Years since quitting: 11.4  Smokeless Tobacco Never Used    Labs: Recent Chemical engineer    Labs for ITP Cardiac and Pulmonary Rehab Latest Ref Rng & Units 09/07/2019 12/10/2019 05/08/2020 11/09/2020 11/13/2020   Cholestrol 100 - 199 mg/dL - 136 134 - 130   LDLCALC 0 - 99 mg/dL - 75 66 - 63   HDL >39 mg/dL - 42 52 - 43   Trlycerides 0 - 149 mg/dL - 93 83 - 137   Hemoglobin A1c 4.0 - 5.6 % 7.2(H) 7.5(H) 7.4(H) 7.7(A) -   HCO3 20.0 - 24.0 mEq/L - - - - -   TCO2 0 - 100 mmol/L - - - - -   O2SAT % - - - - -       Exercise Target Goals: Exercise Program Goal: Individual exercise prescription set using results from initial 6 min walk test and THRR while considering  patient's activity barriers and safety.   Exercise Prescription Goal: Initial exercise prescription builds to 30-45 minutes a day of aerobic activity, 2-3 days per week.  Home exercise guidelines will be given to patient during program as part of exercise prescription that the participant will acknowledge.   Education: Aerobic Exercise: - Group verbal and visual presentation on the components of exercise prescription. Introduces F.I.T.T principle from ACSM for exercise prescriptions.  Reviews F.I.T.T. principles of aerobic exercise including progression. Written material given at graduation.   Education: Resistance Exercise: - Group verbal and visual presentation on the components of exercise prescription. Introduces F.I.T.T principle from ACSM for exercise prescriptions  Reviews F.I.T.T. principles of resistance exercise including progression. Written material given at graduation.    Education: Exercise & Equipment Safety: - Individual verbal instruction and demonstration of  equipment use and safety with use of the equipment. Flowsheet Row Cardiac Rehab from 05/24/2021 in [facility] Cardiac and Pulmonary Rehab  Date 05/24/21  Educator AS  Instruction Review Code 1- Verbalizes Understanding      Education: Exercise Physiology & General Exercise Guidelines: - Group verbal and written instruction with models to review the exercise physiology of the cardiovascular system and associated critical values. Provides general exercise guidelines with specific guidelines to those with heart or lung disease.    Education: Flexibility, Balance, Mind/Body Relaxation: - Group verbal and visual presentation with interactive activity on the components of exercise prescription. Introduces F.I.T.T principle from ACSM for exercise prescriptions. Reviews F.I.T.T.  principles of flexibility and balance exercise training including progression. Also discusses the mind body connection.  Reviews various relaxation techniques to help reduce and manage stress (i.e. Deep breathing, progressive muscle relaxation, and visualization). Balance handout provided to take home. Written material given at graduation.   Activity Barriers & Risk Stratification:  Activity Barriers & Cardiac Risk Stratification - 05/14/21 1406      Activity Barriers & Cardiac Risk Stratification   Activity Barriers Back Problems;Joint Problems;Decreased Ventricular Function;Muscular Weakness    Cardiac Risk Stratification High           6 Minute Walk:  6 Minute Walk    Row Name 05/24/21 1531         6 Minute Walk   Distance 590 feet     Walk Time 5 minutes     # of Rest Breaks 2     MPH 1.34     METS 1.1     RPE 17     Perceived Dyspnea  3     VO2 Peak 3.86     Symptoms Yes (comment)     Comments leg pain/cramping due to vascular issue 5/10     Resting HR 90 bpm     Resting BP 126/62     Resting Oxygen Saturation  98 %     Exercise Oxygen Saturation  during 6 min walk 96 %     Max Ex. HR 118 bpm     Max Ex.  BP 156/72     2 Minute Post BP 140/70            Oxygen Initial Assessment:   Oxygen Re-Evaluation:   Oxygen Discharge (Final Oxygen Re-Evaluation):   Initial Exercise Prescription:  Initial Exercise Prescription - 05/24/21 1500      Date of Initial Exercise RX and Referring Provider   Date 05/24/21    Referring Provider Gollan      NuStep   Level 1    SPM 80    Minutes 15    METs 1.1      Recumbant Elliptical   Level 1    RPM 50    Minutes 15    METs 1.1      REL-XR   Level 1    Speed 50    Minutes 15    METs 1.1      Track   Laps 10    Minutes 15      Prescription Details   Frequency (times per week) 3    Duration Progress to 30 minutes of continuous aerobic without signs/symptoms of physical distress      Intensity   THRR 40-80% of Max Heartrate 107-124    Ratings of Perceived Exertion 11-13    Perceived Dyspnea 0-4      Progression   Progression Continue to progress workloads to maintain intensity without signs/symptoms of physical distress.      Resistance Training   Training Prescription Yes    Weight 3 lb    Reps 10-15           Perform Capillary Blood Glucose checks as needed.  Exercise Prescription Changes:  Exercise Prescription Changes    Row Name 05/24/21 1600             Response to Exercise   Blood Pressure (Admit) 126/62       Blood Pressure (Exercise) 156/72       Blood Pressure (Exit) 142/70       Heart Rate (Admit)  90 bpm       Heart Rate (Exercise) 118 bpm       Heart Rate (Exit) 93 bpm       Oxygen Saturation (Admit) 98 %       Oxygen Saturation (Exercise) 96 %       Rating of Perceived Exertion (Exercise) 17       Perceived Dyspnea (Exercise) 3       Symptoms 5/10 leg cramp/pain              Exercise Comments:   Exercise Goals and Review:  Exercise Goals    Row Name 05/24/21 1602             Exercise Goals   Increase Physical Activity Yes       Intervention Provide advice, education, support  and counseling about physical activity/exercise needs.;Develop an individualized exercise prescription for aerobic and resistive training based on initial evaluation findings, risk stratification, comorbidities and participant's personal goals.       Expected Outcomes Short Term: Attend rehab on a regular basis to increase amount of physical activity.;Long Term: Add in home exercise to make exercise part of routine and to increase amount of physical activity.;Long Term: Exercising regularly at least 3-5 days a week.       Increase Strength and Stamina Yes       Intervention Provide advice, education, support and counseling about physical activity/exercise needs.;Develop an individualized exercise prescription for aerobic and resistive training based on initial evaluation findings, risk stratification, comorbidities and participant's personal goals.       Expected Outcomes Short Term: Increase workloads from initial exercise prescription for resistance, speed, and METs.;Long Term: Improve cardiorespiratory fitness, muscular endurance and strength as measured by increased METs and functional capacity (6MWT)       Able to understand and use rate of perceived exertion (RPE) scale Yes       Intervention Provide education and explanation on how to use RPE scale       Expected Outcomes Short Term: Able to use RPE daily in rehab to express subjective intensity level;Long Term:  Able to use RPE to guide intensity level when exercising independently       Able to understand and use Dyspnea scale Yes       Intervention Provide education and explanation on how to use Dyspnea scale       Expected Outcomes Short Term: Able to use Dyspnea scale daily in rehab to express subjective sense of shortness of breath during exertion;Long Term: Able to use Dyspnea scale to guide intensity level when exercising independently       Knowledge and understanding of Target Heart Rate Range (THRR) Yes       Intervention Provide  education and explanation of THRR including how the numbers were predicted and where they are located for reference       Expected Outcomes Short Term: Able to state/look up THRR;Short Term: Able to use daily as guideline for intensity in rehab;Long Term: Able to use THRR to govern intensity when exercising independently       Able to check pulse independently Yes       Intervention Provide education and demonstration on how to check pulse in carotid and radial arteries.;Review the importance of being able to check your own pulse for safety during independent exercise       Expected Outcomes Short Term: Able to explain why pulse checking is important during independent exercise;Long Term: Able to check pulse  independently and accurately       Understanding of Exercise Prescription Yes       Intervention Provide education, explanation, and written materials on patient's individual exercise prescription       Expected Outcomes Short Term: Able to explain program exercise prescription;Long Term: Able to explain home exercise prescription to exercise independently              Exercise Goals Re-Evaluation :   Discharge Exercise Prescription (Final Exercise Prescription Changes):  Exercise Prescription Changes - 05/24/21 1600      Response to Exercise   Blood Pressure (Admit) 126/62    Blood Pressure (Exercise) 156/72    Blood Pressure (Exit) 142/70    Heart Rate (Admit) 90 bpm    Heart Rate (Exercise) 118 bpm    Heart Rate (Exit) 93 bpm    Oxygen Saturation (Admit) 98 %    Oxygen Saturation (Exercise) 96 %    Rating of Perceived Exertion (Exercise) 17    Perceived Dyspnea (Exercise) 3    Symptoms 5/10 leg cramp/pain           Nutrition:  Target Goals: Understanding of nutrition guidelines, daily intake of sodium 1500mg , cholesterol 200mg , calories 30% from fat and 7% or less from saturated fats, daily to have 5 or more servings of fruits and vegetables.  Education: All About  Nutrition: -Group instruction provided by verbal, written material, interactive activities, discussions, models, and posters to present general guidelines for heart healthy nutrition including fat, fiber, MyPlate, the role of sodium in heart healthy nutrition, utilization of the nutrition label, and utilization of this knowledge for meal planning. Follow up email sent as well. Written material given at graduation.   Biometrics:  Pre Biometrics - 05/24/21 1603      Pre Biometrics   Height 5' 9.5" (1.765 m)    Weight 190 lb (86.2 kg)    BMI (Calculated) 27.67            Nutrition Therapy Plan and Nutrition Goals:   Nutrition Assessments:  MEDIFICTS Score Key:  ?70 Need to make dietary changes   40-70 Heart Healthy Diet  ? 40 Therapeutic Level Cholesterol Diet   Picture Your Plate Scores:  <78 Unhealthy dietary pattern with much room for improvement.  41-50 Dietary pattern unlikely to meet recommendations for good health and room for improvement.  51-60 More healthful dietary pattern, with some room for improvement.   >60 Healthy dietary pattern, although there may be some specific behaviors that could be improved.    Nutrition Goals Re-Evaluation:   Nutrition Goals Discharge (Final Nutrition Goals Re-Evaluation):   Psychosocial: Target Goals: Acknowledge presence or absence of significant depression and/or stress, maximize coping skills, provide positive support system. Participant is able to verbalize types and ability to use techniques and skills needed for reducing stress and depression.   Education: Stress, Anxiety, and Depression - Group verbal and visual presentation to define topics covered.  Reviews how body is impacted by stress, anxiety, and depression.  Also discusses healthy ways to reduce stress and to treat/manage anxiety and depression.  Written material given at graduation.   Education: Sleep Hygiene -Provides group verbal and written instruction  about how sleep can affect your health.  Define sleep hygiene, discuss sleep cycles and impact of sleep habits. Review good sleep hygiene tips.    Initial Review & Psychosocial Screening:  Initial Psych Review & Screening - 05/14/21 1411      Initial Review   Current issues  with Current Stress Concerns    Source of Stress Concerns Unable to participate in former interests or hobbies;Unable to perform yard/household activities;Chronic Illness      Family Dynamics   Good Support System? Yes   wife     Barriers   Psychosocial barriers to participate in program There are no identifiable barriers or psychosocial needs.;The patient should benefit from training in stress management and relaxation.      Screening Interventions   Interventions Encouraged to exercise;Provide feedback about the scores to participant;To provide support and resources with identified psychosocial needs;Program counselor consult    Expected Outcomes Short Term goal: Utilizing psychosocial counselor, staff and physician to assist with identification of specific Stressors or current issues interfering with healing process. Setting desired goal for each stressor or current issue identified.;Long Term Goal: Stressors or current issues are controlled or eliminated.;Short Term goal: Identification and review with participant of any Quality of Life or Depression concerns found by scoring the questionnaire.;Long Term goal: The participant improves quality of Life and PHQ9 Scores as seen by post scores and/or verbalization of changes           Quality of Life Scores:   Scores of 19 and below usually indicate a poorer quality of life in these areas.  A difference of  2-3 points is a clinically meaningful difference.  A difference of 2-3 points in the total score of the Quality of Life Index has been associated with significant improvement in overall quality of life, self-image, physical symptoms, and general health in studies  assessing change in quality of life.  PHQ-9: Recent Review Flowsheet Data    Depression screen [facility] 2/9 11/08/2020 11/08/2019   Decreased Interest 0 0   Down, Depressed, Hopeless 0 0   PHQ - 2 Score 0 0     Interpretation of Total Score  Total Score Depression Severity:  1-4 = Minimal depression, 5-9 = Mild depression, 10-14 = Moderate depression, 15-19 = Moderately severe depression, 20-27 = Severe depression   Psychosocial Evaluation and Intervention:  Psychosocial Evaluation - 05/14/21 1416      Psychosocial Evaluation & Interventions   Interventions Encouraged to exercise with the program and follow exercise prescription;Stress management education    Comments Early reports doing well. His hearing is a big stressor for him, so he relies on his wife's help in this area as well as managing his health care. He states he is sleeping well now that they have gotten his medication adjusted. He did have some back issues that have caused walking difficulties at times and increased his weakness which has led him to not doing whatever he wants, so he is looking forward to working on his balance and his stamina. He is also starting radiation medication so he is a little hesitant as to how he will feel during treatment.    Expected Outcomes Short; attend Cardiac Rehab for education and Exercise. Long: develop and maintain positive self care habits.    Continue Psychosocial Services  Follow up required by staff           Psychosocial Re-Evaluation:   Psychosocial Discharge (Final Psychosocial Re-Evaluation):   Vocational Rehabilitation: Provide vocational rehab assistance to qualifying candidates.   Vocational Rehab Evaluation & Intervention:  Vocational Rehab - 05/14/21 1411      Initial Vocational Rehab Evaluation & Intervention   Assessment shows need for Vocational Rehabilitation No           Education: Education Goals: Education classes will be  provided on a variety of topics  geared toward better understanding of heart health and risk factor modification. Participant will state understanding/return demonstration of topics presented as noted by education test scores.  Learning Barriers/Preferences:  Learning Barriers/Preferences - 05/14/21 1406      Learning Barriers/Preferences   Learning Barriers Hearing    Learning Preferences Individual Instruction           General Cardiac Education Topics:  AED/CPR: - Group verbal and written instruction with the use of models to demonstrate the basic use of the AED with the basic ABC's of resuscitation.   Anatomy and Cardiac Procedures: - Group verbal and visual presentation and models provide information about basic cardiac anatomy and function. Reviews the testing methods done to diagnose heart disease and the outcomes of the test results. Describes the treatment choices: Medical Management, Angioplasty, or Coronary Bypass Surgery for treating various heart conditions including Myocardial Infarction, Angina, Valve Disease, and Cardiac Arrhythmias.  Written material given at graduation.   Medication Safety: - Group verbal and visual instruction to review commonly prescribed medications for heart and lung disease. Reviews the medication, class of the drug, and side effects. Includes the steps to properly store meds and maintain the prescription regimen.  Written material given at graduation.   Intimacy: - Group verbal instruction through game format to discuss how heart and lung disease can affect sexual intimacy. Written material given at graduation..   Know Your Numbers and Heart Failure: - Group verbal and visual instruction to discuss disease risk factors for cardiac and pulmonary disease and treatment options.  Reviews associated critical values for Overweight/Obesity, Hypertension, Cholesterol, and Diabetes.  Discusses basics of heart failure: signs/symptoms and treatments.  Introduces Heart Failure Zone chart  for action plan for heart failure.  Written material given at graduation.   Infection Prevention: - Provides verbal and written material to individual with discussion of infection control including proper hand washing and proper equipment cleaning during exercise session. Flowsheet Row Cardiac Rehab from 05/24/2021 in [facility] Cardiac and Pulmonary Rehab  Date 05/24/21  Educator AS  Instruction Review Code 1- Verbalizes Understanding      Falls Prevention: - Provides verbal and written material to individual with discussion of falls prevention and safety. Flowsheet Row Cardiac Rehab from 05/24/2021 in [facility] Cardiac and Pulmonary Rehab  Date 05/24/21  Educator AS  Instruction Review Code 1- Verbalizes Understanding      Other: -Provides group and verbal instruction on various topics (see comments)   Knowledge Questionnaire Score:   Core Components/Risk Factors/Patient Goals at Admission:  Personal Goals and Risk Factors at Admission - 05/24/21 1604      Core Components/Risk Factors/Patient Goals on Admission   Diabetes Yes    Intervention Provide education about signs/symptoms and action to take for hypo/hyperglycemia.;Provide education about proper nutrition, including hydration, and aerobic/resistive exercise prescription along with prescribed medications to achieve blood glucose in normal ranges: Fasting glucose 65-99 mg/dL    Expected Outcomes Short Term: Participant verbalizes understanding of the signs/symptoms and immediate care of hyper/hypoglycemia, proper foot care and importance of medication, aerobic/resistive exercise and nutrition plan for blood glucose control.;Long Term: Attainment of HbA1C < 7%.    Heart Failure Yes    Intervention Provide a combined exercise and nutrition program that is supplemented with education, support and counseling about heart failure. Directed toward relieving symptoms such as shortness of breath, decreased exercise tolerance, and extremity edema.     Expected Outcomes Improve functional capacity of life;Short term: Attendance  in program 2-3 days a week with increased exercise capacity. Reported lower sodium intake. Reported increased fruit and vegetable intake. Reports medication compliance.;Short term: Daily weights obtained and reported for increase. Utilizing diuretic protocols set by physician.;Long term: Adoption of self-care skills and reduction of barriers for early signs and symptoms recognition and intervention leading to self-care maintenance.    Hypertension Yes    Intervention Provide education on lifestyle modifcations including regular physical activity/exercise, weight management, moderate sodium restriction and increased consumption of fresh fruit, vegetables, and low fat dairy, alcohol moderation, and smoking cessation.;Monitor prescription use compliance.    Expected Outcomes Short Term: Continued assessment and intervention until BP is < 140/28mm HG in hypertensive participants. < 130/1mm HG in hypertensive participants with diabetes, heart failure or chronic kidney disease.;Long Term: Maintenance of blood pressure at goal levels.           Education:Diabetes - Individual verbal and written instruction to review signs/symptoms of diabetes, desired ranges of glucose level fasting, after meals and with exercise. Acknowledge that pre and post exercise glucose checks will be done for 3 sessions at entry of program. [place] from 05/14/2021 in [facility]  Date 05/14/21  Educator [facility]  Instruction Review Code 1- Verbalizes Understanding      Core Components/Risk Factors/Patient Goals Review:    Core Components/Risk Factors/Patient Goals at Discharge (Final Review):    ITP Comments:  ITP Comments    Row Name 05/14/21 1404           ITP Comments Initial telephone orientation completed. Diagnosis can be found in [facility] 5/2. EP orientation scheduled for Thursday 6/2 at 2pm.               Comments: initial ITP

## 2021-05-24 NOTE — Patient Instructions (Signed)
Patient Instructions  Patient Details  Name: Howard [name] MRN: [id number] Date of Birth: 1931-07-12 Referring Provider:  [name], MD  Below are your personal goals for exercise, nutrition, and risk factors. Our goal is to help you stay on track towards obtaining and maintaining these goals. We will be discussing your progress on these goals with you throughout the program.  Initial Exercise Prescription:  Initial Exercise Prescription - 05/24/21 1500      Date of Initial Exercise RX and Referring Provider   Date 05/24/21    Referring Provider Gollan      NuStep   Level 1    SPM 80    Minutes 15    METs 1.1      Recumbant Elliptical   Level 1    RPM 50    Minutes 15    METs 1.1      REL-XR   Level 1    Speed 50    Minutes 15    METs 1.1      Track   Laps 10    Minutes 15      Prescription Details   Frequency (times per week) 3    Duration Progress to 30 minutes of continuous aerobic without signs/symptoms of physical distress      Intensity   THRR 40-80% of Max Heartrate 107-124    Ratings of Perceived Exertion 11-13    Perceived Dyspnea 0-4      Progression   Progression Continue to progress workloads to maintain intensity without signs/symptoms of physical distress.      Resistance Training   Training Prescription Yes    Weight 3 lb    Reps 10-15           Exercise Goals: Frequency: Be able to perform aerobic exercise two to three times per week in program working toward 2-5 days per week of home exercise.  Intensity: Work with a perceived exertion of 11 (fairly light) - 15 (hard) while following your exercise prescription.  We will make changes to your prescription with you as you progress through the program.   Duration: Be able to do 30 to 45 minutes of continuous aerobic exercise in addition to a 5 minute warm-up and a 5 minute cool-down routine.   Nutrition Goals: Your personal nutrition goals will be established when you do your  nutrition analysis with the dietician.  The following are general nutrition guidelines to follow: Cholesterol < 200mg /day Sodium < 1500mg /day Fiber: Men over 50 yrs - 30 grams per day  Personal Goals:  Personal Goals and Risk Factors at Admission - 05/24/21 1604      Core Components/Risk Factors/Patient Goals on Admission   Diabetes Yes    Intervention Provide education about signs/symptoms and action to take for hypo/hyperglycemia.;Provide education about proper nutrition, including hydration, and aerobic/resistive exercise prescription along with prescribed medications to achieve blood glucose in normal ranges: Fasting glucose 65-99 mg/dL    Expected Outcomes Short Term: Participant verbalizes understanding of the signs/symptoms and immediate care of hyper/hypoglycemia, proper foot care and importance of medication, aerobic/resistive exercise and nutrition plan for blood glucose control.;Long Term: Attainment of HbA1C < 7%.    Heart Failure Yes    Intervention Provide a combined exercise and nutrition program that is supplemented with education, support and counseling about heart failure. Directed toward relieving symptoms such as shortness of breath, decreased exercise tolerance, and extremity edema.    Expected Outcomes Improve functional capacity of life;Short term: Attendance in  program 2-3 days a week with increased exercise capacity. Reported lower sodium intake. Reported increased fruit and vegetable intake. Reports medication compliance.;Short term: Daily weights obtained and reported for increase. Utilizing diuretic protocols set by physician.;Long term: Adoption of self-care skills and reduction of barriers for early signs and symptoms recognition and intervention leading to self-care maintenance.    Hypertension Yes    Intervention Provide education on lifestyle modifcations including regular physical activity/exercise, weight management, moderate sodium restriction and increased  consumption of fresh fruit, vegetables, and low fat dairy, alcohol moderation, and smoking cessation.;Monitor prescription use compliance.    Expected Outcomes Short Term: Continued assessment and intervention until BP is < 140/58mm HG in hypertensive participants. < 130/63mm HG in hypertensive participants with diabetes, heart failure or chronic kidney disease.;Long Term: Maintenance of blood pressure at goal levels.           Tobacco Use Initial Evaluation: Social History   Tobacco Use  Smoking Status Former Smoker  . Packs/day: 1.00  . Years: 56.00  . Pack years: 56.00  . Types: Cigarettes  . Quit date: 2011  . Years since quitting: 11.4  Smokeless Tobacco Never Used    Exercise Goals and Review:  Exercise Goals    Row Name 05/24/21 1602             Exercise Goals   Increase Physical Activity Yes       Intervention Provide advice, education, support and counseling about physical activity/exercise needs.;Develop an individualized exercise prescription for aerobic and resistive training based on initial evaluation findings, risk stratification, comorbidities and participant's personal goals.       Expected Outcomes Short Term: Attend rehab on a regular basis to increase amount of physical activity.;Long Term: Add in home exercise to make exercise part of routine and to increase amount of physical activity.;Long Term: Exercising regularly at least 3-5 days a week.       Increase Strength and Stamina Yes       Intervention Provide advice, education, support and counseling about physical activity/exercise needs.;Develop an individualized exercise prescription for aerobic and resistive training based on initial evaluation findings, risk stratification, comorbidities and participant's personal goals.       Expected Outcomes Short Term: Increase workloads from initial exercise prescription for resistance, speed, and METs.;Long Term: Improve cardiorespiratory fitness, muscular endurance  and strength as measured by increased METs and functional capacity (6MWT)       Able to understand and use rate of perceived exertion (RPE) scale Yes       Intervention Provide education and explanation on how to use RPE scale       Expected Outcomes Short Term: Able to use RPE daily in rehab to express subjective intensity level;Long Term:  Able to use RPE to guide intensity level when exercising independently       Able to understand and use Dyspnea scale Yes       Intervention Provide education and explanation on how to use Dyspnea scale       Expected Outcomes Short Term: Able to use Dyspnea scale daily in rehab to express subjective sense of shortness of breath during exertion;Long Term: Able to use Dyspnea scale to guide intensity level when exercising independently       Knowledge and understanding of Target Heart Rate Range (THRR) Yes       Intervention Provide education and explanation of THRR including how the numbers were predicted and where they are located for reference  Expected Outcomes Short Term: Able to state/look up THRR;Short Term: Able to use daily as guideline for intensity in rehab;Long Term: Able to use THRR to govern intensity when exercising independently       Able to check pulse independently Yes       Intervention Provide education and demonstration on how to check pulse in carotid and radial arteries.;Review the importance of being able to check your own pulse for safety during independent exercise       Expected Outcomes Short Term: Able to explain why pulse checking is important during independent exercise;Long Term: Able to check pulse independently and accurately       Understanding of Exercise Prescription Yes       Intervention Provide education, explanation, and written materials on patient's individual exercise prescription       Expected Outcomes Short Term: Able to explain program exercise prescription;Long Term: Able to explain home exercise prescription  to exercise independently              Copy of goals given to participant.

## 2021-05-25 ENCOUNTER — Ambulatory Visit
Admission: RE | Admit: 2021-05-25 | Discharge: 2021-05-25 | Disposition: A | Discharge status: Home / Self Care | Payer: Medicare Other | Source: Ambulatory Visit | Attending: Radiation Oncology | Admitting: Radiation Oncology

## 2021-05-25 DIAGNOSIS — I5042 Chronic combined systolic (congestive) and diastolic (congestive) heart failure: Secondary | ICD-10-CM | POA: Diagnosis not present

## 2021-05-25 DIAGNOSIS — C3411 Malignant neoplasm of upper lobe, right bronchus or lung: Secondary | ICD-10-CM | POA: Diagnosis not present

## 2021-05-25 DIAGNOSIS — E119 Type 2 diabetes mellitus without complications: Secondary | ICD-10-CM | POA: Diagnosis not present

## 2021-05-28 ENCOUNTER — Ambulatory Visit: Payer: Medicare Other

## 2021-05-28 ENCOUNTER — Encounter: Payer: Self-pay | Admitting: Pulmonary Disease

## 2021-05-28 ENCOUNTER — Other Ambulatory Visit: Payer: Self-pay

## 2021-05-28 ENCOUNTER — Ambulatory Visit
Admission: RE | Admit: 2021-05-28 | Discharge: 2021-05-28 | Disposition: A | Discharge status: Home / Self Care | Payer: Medicare Other | Source: Ambulatory Visit | Attending: Radiation Oncology | Admitting: Radiation Oncology

## 2021-05-28 ENCOUNTER — Ambulatory Visit (INDEPENDENT_AMBULATORY_CARE_PROVIDER_SITE_OTHER): Payer: Medicare Other | Admitting: Pulmonary Disease

## 2021-05-28 VITALS — BP 128/62 | HR 76 | Temp 97.7°F | Ht 69.5 in | Wt 190.2 lb

## 2021-05-28 DIAGNOSIS — C3491 Malignant neoplasm of unspecified part of right bronchus or lung: Secondary | ICD-10-CM

## 2021-05-28 DIAGNOSIS — F17210 Nicotine dependence, cigarettes, uncomplicated: Secondary | ICD-10-CM | POA: Diagnosis not present

## 2021-05-28 DIAGNOSIS — R0602 Shortness of breath: Secondary | ICD-10-CM | POA: Diagnosis not present

## 2021-05-28 DIAGNOSIS — I428 Other cardiomyopathies: Secondary | ICD-10-CM

## 2021-05-28 DIAGNOSIS — G4734 Idiopathic sleep related nonobstructive alveolar hypoventilation: Secondary | ICD-10-CM | POA: Diagnosis not present

## 2021-05-28 DIAGNOSIS — E119 Type 2 diabetes mellitus without complications: Secondary | ICD-10-CM | POA: Diagnosis not present

## 2021-05-28 DIAGNOSIS — J449 Chronic obstructive pulmonary disease, unspecified: Secondary | ICD-10-CM | POA: Diagnosis not present

## 2021-05-28 DIAGNOSIS — C3411 Malignant neoplasm of upper lobe, right bronchus or lung: Secondary | ICD-10-CM | POA: Diagnosis not present

## 2021-05-28 DIAGNOSIS — I5042 Chronic combined systolic (congestive) and diastolic (congestive) heart failure: Secondary | ICD-10-CM | POA: Diagnosis not present

## 2021-05-28 NOTE — Patient Instructions (Signed)
Continue using Trelegy once daily.  Continue using albuterol as needed.  Continue using your oxygen at nighttime.  Continue to hold Wilder Glade until you discuss this at your cardiology follow-up appointment.  It appears your blood work is better off of this medication.  We will see you in follow-up in 3 months time call sooner should any new problems arise.

## 2021-05-28 NOTE — Progress Notes (Signed)
Subjective:    Patient ID: Howard [name], male    DOB: 09/25/1931, 87 y.o.   MRN: [id number]  HPI  Howard [name] is an 87 year old former smoker who presents for follow-up on the issue of dyspnea.  Please refer to his prior notes. This is a scheduled visit.  The patient is quite complex has a history of COPD on Trelegy Ellipta previously prescribed by Dr. Raul Del.  He also had a history of sleep apnea however CPAP was discontinued when his machine was recalled.  He is currently on nocturnal oxygen for severe nocturnal hypoxemia and actually feels better on supplemental oxygen than he did with the CPAP.  The patient also recently was noted to have nonischemic cardiomyopathy with LVEF of 20 to 25% and has had his cardiac medications adjusted.  Since the adjustment on his cardiac medications his dyspnea is markedly improved.  He does not note orthopnea or paroxysmal nocturnal dyspnea.  He notes that he rests well with the oxygen and feels that he has more stamina during the day.  On an additional note, patient has a history of adenocarcinoma of the lung and appears to have recurrence he is currently undergoing radiation therapy for his recurrent adenocarcinoma.  He also has mediastinal adenopathy.  He is unfortunately not a candidate for invasive procedures due to his recently diagnosed severe cardiomyopathy and advanced age and frailty.  Case has been previously discussed at tumor board.  Findings are consistent with recurrent stage III lung cancer.   DATA: 04/05/2021  2D echo: LVEF 20 to 25% with global hypokinesis.  Grade II DD, mild mitral regurg. 04/13/2021 right and left heart cath: Mild to moderate three-vessel coronary artery disease with severe calcifications, no obstructive lesions, mild pulmonary hypertension, normal cardiac output.  Nonischemic cardiomyopathy.  Review of Systems A 10 point review of systems was performed and it is as noted above otherwise negative.  Patient Active Problem List    Diagnosis Date Noted  . Goals of care, counseling/discussion 04/16/2021  . Mediastinal lymphadenopathy 04/16/2021  . Heart failure with reduced ejection fraction ([place])   . AAA (abdominal aortic aneurysm) without rupture ([place]) 12/10/2019  . Renal artery stenosis ([place]) 12/10/2019  . PAD (peripheral artery disease) ([place]) 12/10/2019  . Thromboembolism ([place]) 08/30/2019  . Atherosclerosis of native arteries of extremity with intermittent claudication ([place]) 08/23/2019  . Swelling of limb 08/17/2019  . Pain in limb 08/17/2019  . NICM (nonischemic cardiomyopathy) ([place]) 07/01/2019  . CHF (congestive heart failure) ([place]) 07/01/2019  . Malignant neoplasm of right lung ([place]) 04/18/2016  . CAD (coronary artery disease) 04/01/2016  . Adenocarcinoma ([place]) 04/01/2016  . Skin cyst 12/26/2015  . GERD (gastroesophageal reflux disease) 06/16/2015  . Abscess of back 06/06/2015  . Vertigo 03/27/2015  . Carpal tunnel syndrome 10/28/2014  . Status post cholecystectomy 09/29/2014  . Disease of digestive tract 09/29/2014  . Cardiomyopathy ([place]) 09/26/2014  . Type 2 diabetes mellitus without complications ([place])   . Sleep apnea   . Spinal stenosis, lumbar region, with neurogenic claudication 06/07/2014  . Lumbar stenosis with neurogenic claudication 06/07/2014  . Abnormal gait 08/20/2012  . H/O total knee replacement 08/20/2012  . Arthritis of knee, degenerative 08/20/2012  . Pacemaker-St.Jude 08/03/2012  . Cardiac conduction disorder 06/19/2012  . Acid reflux 06/18/2012  . 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 Triggering of digit 03/25/2012  . Hyperlipidemia 12/23/2011  . Essential hypertension 03/23/2011  . Atrioventricular block, complete ([place]) 03/23/2011  . Complete atrioventricular block ([place]) 03/23/2011   Allergies  Allergen Reactions  . Sulfa Antibiotics Rash   Current Meds  Medication Sig  . acetaminophen (TYLENOL) 500 MG tablet Take 1,000 mg by mouth every 6 (six) hours as needed for  moderate pain.  Marland Kitchen albuterol (VENTOLIN HFA) 108 (90 Base) MCG/ACT inhaler Inhale 2 puffs into the lungs every 6 (six) hours as needed for wheezing or shortness of breath.  Marland Kitchen aspirin EC 81 MG tablet Take 81 mg by mouth daily.  . cetirizine (ZYRTEC) 10 MG tablet Take 10 mg by mouth daily.  . Cholecalciferol (VITAMIN D3) 1000 UNITS CAPS Take 1,000 Units by mouth daily.  Marland Kitchen donepezil (ARICEPT) 5 MG tablet Take 5 mg by mouth daily.  Marland Kitchen ELIQUIS 5 MG TABS tablet TAKE 1 TABLET TWICE A DAY  (SWITCHED FROM PLAVIX)  . ezetimibe (ZETIA) 10 MG tablet TAKE 1 TABLET DAILY  . Fluticasone-Umeclidin-Vilant (TRELEGY ELLIPTA) 100-62.5-25 MCG/INH AEPB Inhale 1 puff into the lungs daily.  . furosemide (LASIX) 20 MG tablet Take 1 tablet (20 mg total) by mouth daily.  . Iron-Vitamin C (VITRON-C) 65-125 MG TABS Take 1 tablet by mouth daily.  . isosorbide mononitrate (IMDUR) 30 MG 24 hr tablet TAKE 1 TABLET DAILY  . metFORMIN (GLUCOPHAGE) 500 MG tablet Take 1 tablet (500 mg total) by mouth 2 (two) times daily with a meal.  . metoprolol succinate (TOPROL-XL) 25 MG 24 hr tablet TAKE 1 TABLET AT BEDTIME  . montelukast (SINGULAIR) 10 MG tablet Take 10 mg by mouth daily as needed (sneezing/allergies.).  Marland Kitchen Multiple Vitamin (MULTIVITAMIN WITH MINERALS) TABS tablet Take 1 tablet by mouth daily. One-A-Day Multivitamin  . omeprazole (PRILOSEC) 40 MG capsule TAKE 1 CAPSULE TWICE DAILY AS NEEDED  . sacubitril-valsartan (ENTRESTO) 49-51 MG Take 1 tablet by mouth 2 (two) times daily.  . simvastatin (ZOCOR) 40 MG tablet TAKE 1 TABLET AT BEDTIME  . spironolactone (ALDACTONE) 25 MG tablet Take 1 tablet (25 mg total) by mouth daily.   Social History   Tobacco Use  . Smoking status: Former Smoker    Packs/day: 1.00    Years: 56.00    Pack years: 56.00    Types: Cigarettes    Quit date: 2011    Years since quitting: 11.4  . Smokeless tobacco: Never Used  Substance Use Topics  . Alcohol use: No   Immunization History   Administered Date(s) Administered  . Influenza, High Dose Seasonal PF 09/15/2015, 09/30/2017, 10/02/2018, 09/05/2020  . Influenza, Quadrivalent, Recombinant, Inj, Pf 10/12/2019  . Influenza-Unspecified 08/23/2013, 09/15/2015, 09/12/2016  . PFIZER(Purple Top)SARS-COV-2 Vaccination 12/30/2019, 01/20/2020, 10/05/2020  . Pneumococcal Conjugate-13 04/18/2016  . Pneumococcal Polysaccharide-23 09/30/2017  . Tdap 09/07/2011  . Zoster Recombinat (Shingrix) 08/28/2018, 11/04/2018  . Zoster, Live 11/22/2013       Objective:   Physical Exam BP 128/62 (BP Location: Left Arm, Cuff Size: Normal)   Pulse 76   Temp 97.7 F (36.5 C) (Temporal)   Ht 5' 9.5" (1.765 m)   Wt 190 lb 3.2 oz (86.3 kg)   SpO2 98%   BMI 27.68 kg/m  GENERAL:Well-developed well-nourished elderly gentleman in no acute distress. No tachypnea, speech is fluent. No conversational dyspnea. Presents in transport chair. HEAD: Normocephalic, atraumatic.  EYES: Pupils equal, round, reactive to light. No scleral icterus.  MOUTH:Nose/mouth/throat not examined due to masking requirements for COVID 19. NECK: Supple. No thyromegaly. Trachea midline. No JVD. No adenopathy. PULMONARY: Good air entry bilaterally.Coarse breath sounds with no otheradventitious sounds. CARDIOVASCULAR: S1 and S2. Regular rate and rhythm.Pacer on left chest. No rubs, murmurs  or gallops heard. ABDOMEN:Benign. MUSCULOSKELETAL: No joint deformity, no clubbing, no edema.  NEUROLOGIC:Appears befuddled at times, no overt focal deficit, speech is fluent. SKIN: Intact,warm,dry.On limited exam, no rashes. PSYCH:Mood and behavior normal.  Mildly befuddled.      Assessment & Plan:     ICD-10-CM   1. COPD, moderate ([place])  J44.9    Continue Trelegy Continue as needed albuterol Appears very well compensated  2. Shortness of breath  R06.02    Markedly improved with management of NICM  3. NICM (nonischemic cardiomyopathy) ([place])  I42.8    This issue  adds complexity to his management Followed by cardiology Adds to his symptom of dyspnea  4. Nocturnal hypoxemia  G47.34    Continue supplemental oxygen at 2 L/min nocturnally  5. Stage III adenocarcinoma of lung, right (H[facility])  C34.91    Recurrent by imaging and PET/CT Undergoing radiation therapy   Patient appears well compensated with regards to his COPD and his nocturnal hypoxemia issues.  Cardiomyopathy plays a big part of this sensation of dyspnea.  This appears to be better controlled under expert management by cardiology.  He has noted to have recurrence of non-small cell cancer, not a candidate for invasive procedures, continue with radiation therapy as per radiation oncology  We will see the patient in follow-up in 3 months time call sooner should any new problems arise  C. Derrill Kay, MD [facility]   *This note was dictated using voice recognition software/Dragon.  Despite best efforts to proofread, errors can occur which can change the meaning.  Any change was purely unintentional.

## 2021-05-29 ENCOUNTER — Inpatient Hospital Stay: Payer: Medicare Other

## 2021-05-29 ENCOUNTER — Ambulatory Visit
Admission: RE | Admit: 2021-05-29 | Discharge: 2021-05-29 | Disposition: A | Discharge status: Home / Self Care | Payer: Medicare Other | Source: Ambulatory Visit | Attending: Radiation Oncology | Admitting: Radiation Oncology

## 2021-05-29 ENCOUNTER — Ambulatory Visit (INDEPENDENT_AMBULATORY_CARE_PROVIDER_SITE_OTHER): Payer: Medicare Other | Admitting: Vascular Surgery

## 2021-05-29 ENCOUNTER — Encounter (INDEPENDENT_AMBULATORY_CARE_PROVIDER_SITE_OTHER): Payer: Medicare Other

## 2021-05-29 ENCOUNTER — Other Ambulatory Visit (INDEPENDENT_AMBULATORY_CARE_PROVIDER_SITE_OTHER): Payer: Medicare Other

## 2021-05-29 DIAGNOSIS — C3411 Malignant neoplasm of upper lobe, right bronchus or lung: Secondary | ICD-10-CM | POA: Insufficient documentation

## 2021-05-29 DIAGNOSIS — F17210 Nicotine dependence, cigarettes, uncomplicated: Secondary | ICD-10-CM | POA: Diagnosis not present

## 2021-05-29 DIAGNOSIS — I5042 Chronic combined systolic (congestive) and diastolic (congestive) heart failure: Secondary | ICD-10-CM | POA: Diagnosis not present

## 2021-05-29 DIAGNOSIS — E119 Type 2 diabetes mellitus without complications: Secondary | ICD-10-CM | POA: Diagnosis not present

## 2021-05-29 LAB — CBC
HCT: 36.1 % — ABNORMAL LOW (ref 39.0–52.0)
Hemoglobin: 11.5 g/dL — ABNORMAL LOW (ref 13.0–17.0)
MCH: 26.1 pg (ref 26.0–34.0)
MCHC: 31.9 g/dL (ref 30.0–36.0)
MCV: 82 fL (ref 80.0–100.0)
Platelets: 204 10*3/uL (ref 150–400)
RBC: 4.4 MIL/uL (ref 4.22–5.81)
RDW: 19.7 % — ABNORMAL HIGH (ref 11.5–15.5)
WBC: 3.8 10*3/uL — ABNORMAL LOW (ref 4.0–10.5)
nRBC: 0 % (ref 0.0–0.2)

## 2021-05-29 NOTE — Telephone Encounter (Signed)
Good news!   Lab scanned in shows improved renal function since holding [place]. Continue to hold Farxiga for now (discontinue this medication on his list to avoid confusion). He should continue on his Entresto 49/51 BID.

## 2021-05-30 ENCOUNTER — Ambulatory Visit
Admission: RE | Admit: 2021-05-30 | Discharge: 2021-05-30 | Disposition: A | Discharge status: Home / Self Care | Payer: Medicare Other | Source: Ambulatory Visit | Attending: Radiation Oncology | Admitting: Radiation Oncology

## 2021-05-30 ENCOUNTER — Encounter: Payer: Medicare Other | Admitting: Speech Pathology

## 2021-05-30 ENCOUNTER — Ambulatory Visit: Payer: Medicare Other

## 2021-05-30 DIAGNOSIS — F17210 Nicotine dependence, cigarettes, uncomplicated: Secondary | ICD-10-CM | POA: Diagnosis not present

## 2021-05-30 DIAGNOSIS — C3411 Malignant neoplasm of upper lobe, right bronchus or lung: Secondary | ICD-10-CM | POA: Diagnosis not present

## 2021-05-30 DIAGNOSIS — I5042 Chronic combined systolic (congestive) and diastolic (congestive) heart failure: Secondary | ICD-10-CM | POA: Diagnosis not present

## 2021-05-30 DIAGNOSIS — E119 Type 2 diabetes mellitus without complications: Secondary | ICD-10-CM | POA: Diagnosis not present

## 2021-05-30 NOTE — Addendum Note (Signed)
Addended by: Wynema Birch on: 05/30/2021 07:51 AM   Modules accepted: Orders

## 2021-05-30 NOTE — Telephone Encounter (Signed)
Able to reach pt's wife Howard [name]  regarding Howard [name] recent lab work, provider had a chance to review his results drawn at the cancer center and advised   "Good news!   Lab scanned in shows improved renal function since holding [place]. Continue to hold Farxiga for now (discontinue this medication on his list to avoid confusion). He should continue on his Entresto 49/51 BID."  Howard [name] thankful for the phone call, will have [name] to continue holding the [place] and continue Entresto BID. All questions and concerns were address and no additional concerns at this time. Agreeable to plan, will call back for anything further.    Wilder Glade removed from medication list

## 2021-05-31 ENCOUNTER — Ambulatory Visit
Admission: RE | Admit: 2021-05-31 | Discharge: 2021-05-31 | Disposition: A | Discharge status: Home / Self Care | Payer: Medicare Other | Source: Ambulatory Visit | Attending: Radiation Oncology | Admitting: Radiation Oncology

## 2021-05-31 DIAGNOSIS — I5042 Chronic combined systolic (congestive) and diastolic (congestive) heart failure: Secondary | ICD-10-CM | POA: Diagnosis not present

## 2021-05-31 DIAGNOSIS — E119 Type 2 diabetes mellitus without complications: Secondary | ICD-10-CM | POA: Diagnosis not present

## 2021-05-31 DIAGNOSIS — C3411 Malignant neoplasm of upper lobe, right bronchus or lung: Secondary | ICD-10-CM | POA: Diagnosis not present

## 2021-05-31 DIAGNOSIS — F17210 Nicotine dependence, cigarettes, uncomplicated: Secondary | ICD-10-CM | POA: Diagnosis not present

## 2021-06-01 ENCOUNTER — Ambulatory Visit
Admission: RE | Admit: 2021-06-01 | Discharge: 2021-06-01 | Disposition: A | Discharge status: Home / Self Care | Payer: Medicare Other | Source: Ambulatory Visit | Attending: Radiation Oncology | Admitting: Radiation Oncology

## 2021-06-01 DIAGNOSIS — C3411 Malignant neoplasm of upper lobe, right bronchus or lung: Secondary | ICD-10-CM | POA: Diagnosis not present

## 2021-06-01 DIAGNOSIS — I5042 Chronic combined systolic (congestive) and diastolic (congestive) heart failure: Secondary | ICD-10-CM | POA: Diagnosis not present

## 2021-06-01 DIAGNOSIS — F17210 Nicotine dependence, cigarettes, uncomplicated: Secondary | ICD-10-CM | POA: Diagnosis not present

## 2021-06-01 DIAGNOSIS — E119 Type 2 diabetes mellitus without complications: Secondary | ICD-10-CM | POA: Diagnosis not present

## 2021-06-04 ENCOUNTER — Ambulatory Visit: Payer: Medicare Other

## 2021-06-04 ENCOUNTER — Encounter: Payer: Medicare Other | Admitting: Speech Pathology

## 2021-06-04 ENCOUNTER — Ambulatory Visit (INDEPENDENT_AMBULATORY_CARE_PROVIDER_SITE_OTHER): Payer: Medicare Other | Admitting: Physician Assistant

## 2021-06-04 ENCOUNTER — Ambulatory Visit
Admission: RE | Admit: 2021-06-04 | Discharge: 2021-06-04 | Disposition: A | Discharge status: Home / Self Care | Payer: Medicare Other | Source: Ambulatory Visit | Attending: Radiation Oncology | Admitting: Radiation Oncology

## 2021-06-04 ENCOUNTER — Encounter: Payer: Self-pay | Admitting: Physician Assistant

## 2021-06-04 VITALS — BP 110/72 | HR 68 | Ht 69.0 in | Wt 190.0 lb

## 2021-06-04 DIAGNOSIS — F17210 Nicotine dependence, cigarettes, uncomplicated: Secondary | ICD-10-CM | POA: Diagnosis not present

## 2021-06-04 DIAGNOSIS — E785 Hyperlipidemia, unspecified: Secondary | ICD-10-CM | POA: Diagnosis not present

## 2021-06-04 DIAGNOSIS — I739 Peripheral vascular disease, unspecified: Secondary | ICD-10-CM | POA: Diagnosis not present

## 2021-06-04 DIAGNOSIS — Q2112 Patent foramen ovale: Secondary | ICD-10-CM

## 2021-06-04 DIAGNOSIS — Z950 Presence of cardiac pacemaker: Secondary | ICD-10-CM

## 2021-06-04 DIAGNOSIS — I428 Other cardiomyopathies: Secondary | ICD-10-CM | POA: Diagnosis not present

## 2021-06-04 DIAGNOSIS — E119 Type 2 diabetes mellitus without complications: Secondary | ICD-10-CM | POA: Diagnosis not present

## 2021-06-04 DIAGNOSIS — I5042 Chronic combined systolic (congestive) and diastolic (congestive) heart failure: Secondary | ICD-10-CM | POA: Diagnosis not present

## 2021-06-04 DIAGNOSIS — I38 Endocarditis, valve unspecified: Secondary | ICD-10-CM

## 2021-06-04 DIAGNOSIS — C3411 Malignant neoplasm of upper lobe, right bronchus or lung: Secondary | ICD-10-CM | POA: Diagnosis not present

## 2021-06-04 DIAGNOSIS — I2510 Atherosclerotic heart disease of native coronary artery without angina pectoris: Secondary | ICD-10-CM

## 2021-06-04 DIAGNOSIS — Q211 Atrial septal defect: Secondary | ICD-10-CM

## 2021-06-04 DIAGNOSIS — Z7901 Long term (current) use of anticoagulants: Secondary | ICD-10-CM | POA: Diagnosis not present

## 2021-06-04 MED ORDER — ENTRESTO 49-51 MG PO TABS: 1.0000 | ORAL_TABLET | Freq: Two times a day (BID) | ORAL | 0 refills | Status: DC

## 2021-06-04 NOTE — Progress Notes (Signed)
Office Visit    Patient Name: Howard [name] Date of Encounter: 06/04/2021  PCP:  Chrismon, Dennis E, [place]  Cardiologist:  [name], MD  Advanced Practice Provider:  No care team member to display Electrophysiologist:  [name], MD   Chief Complaint    Chief Complaint  Patient presents with   Follow-up   87 y.o. male with history of chronic combined systolic and diastolic heart failure with newly reduced EF (03/2021 EF 20-25%, asymmetric LVH / G2DD), NICM, nonobstructive CAD (LHC 03/2021), WPW s/p radiofrequency catheter ablation and subsequent development of bundle branch block and complete heart block requiring PPM 2011 with generator change out 06/2019 secondary to ERI, history of 2020 splenic artery infarct now on chronic [place], PFO / positive bubble study (echo 2020), lung carcinoma (recently diagnosed) with upcoming radiation treatment by oncology, PAD s/p interventions as below (including AAA s/p endovascular stent graft, left common femoral endarterectomy, bilateral external iliac artery stenting, left SFA stenting, right renal artery stenting), mild MR with degenerative valve/aortic sclerosis (03/2021), DM2, hypertension, hyperlipidemia, Bell's palsy, prostate cancer, vertigo, chronic back pain s/p multiple surgeries, sleep apnea, and who presents today for follow-up.  Past Medical History    Past Medical History:  Diagnosis Date   Arthritis    Bell palsy    Bell's palsy 04/12/2015   Cancer [facility])    prostate and skin   Chronic combined systolic and diastolic CHF, NYHA class 1 ([place])    a. 07/2014 Echo: EF 35-40%, Gr 1 DD.   Complete heart block ([place])    a. 11/2010 s/p SJM 2210 [employer] DC PPM, ser# [id number].   Depression    Diabetes mellitus without complication ([place])    Fall 11-10-14   GERD (gastroesophageal reflux disease)    History of prostate cancer    Hyperlipidemia    Hypertension    LBBB (left bundle branch block)     Left-sided Bell's palsy    Lung cancer ([place]) 2016   NICM (nonischemic cardiomyopathy) ([place])    a. 07/2014 Echo: EF 35-40%, mid-apicalanteroseptal DK, Gr 1 DD, mild-mod dil LA.   Non-obstructive CAD    a. 07/2014 Abnl MV;  b. 08/2014 Cath: LM nl, LAD 30p, RI 40p, LCX nl, OM1 40, RCA dominant 30p, 70d-->Med Rx.   Poor balance    Presence of permanent cardiac pacemaker    Sleep apnea    a. cpap   Vertigo    WPW (Wolff-Parkinson-White syndrome)    a. S/P RFCA 1991.   Past Surgical History:  Procedure Laterality Date   ABDOMINAL AORTIC ENDOVASCULAR STENT GRAFT  08/25/2019   Procedure: ABDOMINAL AORTIC ENDOVASCULAR STENT GRAFT;  Surgeon: Algernon Huxley, MD;  Location: [facility] ORS;  Service: Vascular;;   ANGIOPLASTY Left 08/25/2019   Procedure: ANGIOPLASTY;  Surgeon: Algernon Huxley, MD;  Location: [facility] ORS;  Service: Vascular;  Laterality: Left;  left SFA and stent placement   APPLICATION OF WOUND VAC Left 06/07/2015   Procedure: APPLICATION OF WOUND VAC;  Surgeon: Robert Bellow, MD;  Location: [facility] ORS;  Service: General;  Laterality: Left;  left upper back   [place]  08/26/2014   Single vessel obstructive CAD   CARPAL TUNNEL RELEASE  04-04-15   [facility]   CATARACT EXTRACTION  07-31-11 and 09-18-11   Catheter ablation  1991   for WPW   cervical fusion     CHOLECYSTECTOMY  09-07-14   ENDARTERECTOMY FEMORAL Left 08/25/2019   Procedure: ENDARTERECTOMY FEMORAL;  Surgeon: Algernon Huxley, MD;  Location: [facility] ORS;  Service: Vascular;  Laterality: Left;  common and produndis    ENDOVASCULAR REPAIR/STENT GRAFT Right 08/25/2019   Procedure: ENDOVASCULAR REPAIR/STENT GRAFT;  Surgeon: Algernon Huxley, MD;  Location: [facility] ORS;  Service: Vascular;  Laterality: Right;  renal artery   HAND SURGERY     right 1993; left 2005   [place] / REPLACE / REMOVE PACEMAKER     INSERTION OF ILIAC STENT Bilateral 08/25/2019   Procedure: INSERTION OF ILIAC STENT;  Surgeon: Algernon Huxley, MD;  Location: [facility] ORS;  Service: Vascular;  Laterality: Bilateral;   JOINT REPLACEMENT Left 2013   knee   JOINT REPLACEMENT Right 2004   knee   KNEE SURGERY     left knee 1991 and 1992; right knee [place] N/A 08/26/2014   Procedure: LEFT HEART CATHETERIZATION WITH CORONARY ANGIOGRAM;  Surgeon: [name], MD;  Location: [facility] CATH LAB;  Service: Cardiovascular;  Laterality: N/A;   LOWER EXTREMITY ANGIOGRAPHY Left 08/23/2019   Procedure: Lower Extremity Angiography;  Surgeon: Algernon Huxley, MD;  Location: [place] CV LAB;  Service: Cardiovascular;  Laterality: Left;   LUMBAR LAMINECTOMY/DECOMPRESSION MICRODISCECTOMY N/A 06/07/2014   Procedure: LUMBAR FOUR TO FIVE LUMBAR LAMINECTOMY/DECOMPRESSION MICRODISCECTOMY 1 LEVEL;  Surgeon: [name], MD;  Location: [place] NEURO ORS;  Service: Neurosurgery;  Laterality: N/A;   LUNG BIOPSY Right 2016   Dr [name]   MOHS SURGERY     PACEMAKER INSERTION     PPM-- St Jude 11/30/10 by [name]   PPM GENERATOR CHANGEOUT N/A 07/09/2019   Procedure: PPM GENERATOR CHANGEOUT;  Surgeon: Deboraha Sprang, MD;  Location: [place] CV LAB;  Service: Cardiovascular;  Laterality: N/A;   PROSTATE SURGERY     cancer--1998, prostatectomy   REPLACEMENT TOTAL KNEE     2004   RIGHT/LEFT HEART CATH AND CORONARY ANGIOGRAPHY Bilateral 04/13/2021   Procedure: RIGHT/LEFT HEART CATH AND CORONARY ANGIOGRAPHY;  Surgeon: Wellington Hampshire, MD;  Location: [place] CV LAB;  Service: Cardiovascular;  Laterality: Bilateral;   ruptured disc     1962 and 1998   TEE WITHOUT CARDIOVERSION N/A 09/01/2019   Procedure: TRANSESOPHAGEAL ECHOCARDIOGRAM (TEE);  Surgeon: [name], MD;  Location: [facility] ORS;  Service: Cardiovascular;  Laterality: N/A;   TEMPORARY PACEMAKER N/A 07/09/2019   Procedure: TEMPORARY PACEMAKER;  Surgeon: Deboraha Sprang, MD;  Location: [place] CV LAB;  Service: Cardiovascular;  Laterality: N/A;   TRIGGER FINGER  RELEASE  01-24-15   WOUND DEBRIDEMENT Left 06/07/2015   Procedure: DEBRIDEMENT WOUND;  Surgeon: Robert Bellow, MD;  Location: [facility] ORS;  Service: General;  Laterality: Left;  left upper back    Allergies  Allergies  Allergen Reactions   Sulfa Antibiotics Rash    History of Present Illness    ELIER ZELLARS is a 87 y.o. male with history of chronic combined systolic and diastolic heart failure with newly reduced EF (03/2021 EF 20-25%, asymmetric LVH / G2DD), NICM, nonobstructive CAD (LHC 03/2021), WPW s/p radiofrequency catheter ablation and subsequent development of bundle branch block and complete heart block requiring PPM 2011 with generator change out 06/2019 secondary to ERI, history of 2020 splenic artery infarct now on chronic [place], PFO / positive bubble study (echo 2020), lung carcinoma (recently diagnosed) with upcoming radiation treatment by oncology,  PAD s/p interventions as below (including AAA s/p endovascular stent graft, left common femoral endarterectomy, bilateral external iliac artery stenting, left SFA stenting, right renal artery stenting), mild MR with degenerative valve/aortic sclerosis (03/2021), DM2, hypertension, hyperlipidemia, Bell's palsy, prostate cancer, vertigo, chronic back pain s/p multiple surgeries, sleep apnea, and who presents today for follow-up after recent catheterization.  He was previously evaluated at [facility] for WPW and underwent radiofrequency catheter ablation with subsequent development of bundle branch block and complete heart block, requiring pacemaker implantation in 2011.  He is s/p generator change out 06/2019 secondary to ERI.  He follows with Dr. Caryl Comes of [facility].  07/2014 echo EF 35 to 40%.  Diagnostic cardiac cath in 2015 was performed in the setting of abnormal stress test with nonobstructive disease.  EF subsequently improved to 45 to 50% in 2016, followed by reduction in EF to 40% in 2019.  Myoview 07/2018 was without significant ischemia with attenuation  corrected images showing moderate size region of fixed defect mid to apical inferior wall and apical region, EF 33%.  Overall, MPI ruled moderate low risk, given low EF and size of fixed defect.  06/2019, he was hospitalized with a splenic infarct and has since been treated with anticoagulation at the direction of Dr. Lucky Cowboy of [facility].  There is concern for cardioembolic source and atrial fibrillation at that time, though it is not shown on device review.  09/01/2019 echo showed LVEF 50 to 55%, no significant valvular abnormalities, and evidence of atrial shunting with bubble study positive for PFO.  08/2019, he underwent AAA endovascular stent graft.  Previously, he had PAD intervention, including left femoral endarterectomy, left SFA stent, right renal artery stent, and bilateral external iliac artery stenting.  Seen 03/2020 with lower extremity edema in the day, improved by morning.  No edema noted in clinic.  Elevation of lower extremities, low-salt diet, compression stockings recommended.  04/02/2020, he contacted his pulmonologist with recommendation for chest CT, which showed enlargement of right lower lobe nodule, as well as lymph nodes consistent with enlarging primary lung malignancy and new metastatic disease.  04/05/2021 echo with LVEF 20 to 25%, LV global hypokinesis, mild asymmetric LVH, G2 DD, RVSF moderately reduced.  Mitral valve was degenerative with mild MR.  Aortic valve was tricuspid with mild to moderate sclerosis without stenosis.  IVC was dilated with estimated RAP 15 mmHg.  Mild asymmetric LVH was noted.  04/12/21 PET scan showed 2.7 x 2.0 cm hypermetabolic metabolic right lower lobe pulmonary nodule with bilateral mediastinal lymphadenopathy.  Findings were concerning for primary bronchogenic carcinoma.  Also noted was aortic atherosclerosis and left main/three-vessel coronary artery disease.  Calcifications of the aortic valve and mitral valve/annulus were noted.    04/13/2021 R/LHC was  performed with mild to moderate three-vessel coronary artery disease with severe calcifications.  No obstructive lesions were noted.  Specifically, pRCA was 30%s, dRCA 50%s.  pLAD to mLAD 20%s.  Ostial circumflex to proximal circumflex 30%s.  First marginal lesion 40%s and second diagonal lesion 60%s.  Recommendations were for aggressive medical therapy.  During catheterization, he was not felt to be volume overloaded.  Seen 04/23/21 and joined by his son. He was waking at 4am-5am at times with SOB that required him to use his albuterol, so he switched to taking albuterol before bed with improved breathing overnight. At times, his son noticed it appeared as if he was working hard to breathe. Low O2 saturations reported at times. He was uncertain if his urine was red,  due to a new toilet light that was red tinted. He was starting radiation tx for his lung cancer. Recommendations were to increase Entresto 49-51 BID and start Farxiga. CBC and BMET obtained via oncology and cardiac rehab referral checked. Subsequent labs showed bump in Cr with recommendation to discontinue [place].   He was seen 05/28/21 by pulmonology for follow-up of DOE. It was noted that his history was complex including COPD. He had sleep apnea with CPAP discontinued when the machine was recalled. He was on night oxygen for nocturnal hypoxemia and felt better on this oxygen than the CPAP. It was also noted his presentation was complicated by history of recurrent adenocarcinoma with radiation therapy and mediastinal adenopathy. He was not a candidate for invasive procedures. His case had been discussed with the tumor board and findings consistent with recurrent stage III lung cancer.   Today, 06/04/2021, he presents to clinic and is joined by his wife.  He is tolerating Entresto well.  SBP's 140s to 150s over 90 before Entresto with recommendation to start monitoring BP 2 hours after taking this medication.  He and his wife do report some expense  associated with both Entresto and Eliquis.  They state that they have worked out how to pay for subsequent refills of these medications but initially had to pay quite a bit (Ie Entresto at $600).  They were encouraged to always let the office know if the medication was difficult to afford in the near future, as medication assistance forms could be completed.  Mr. Turman has enrolled in heart track and is planning to start this program after his radiation therapy finishes 06/19/2021.  He denies any racing heart rate or palpitations.  He might feel dizzy right after radiation therapy.  He uses the wheelchair today/at the hospital but otherwise does not use any assistance at home, though his wife reports he should use a cane to prevent falls.  No recent falls. No syncope.  He reports his breathing has significantly improved since her last visit with only 1 episode last week that required use of his inhaler.  Overall, he is using his inhaler less often.  He feels this has been the result of his cardiac medications, particularly his lasix, as he reports that his pulmonary edema has improved to the extent of needing a "new form" at radiation therapy.  He has noticed increased urination with his diuretic, which has continued.  No further report of red urine, as it appears that this was actually due to the red tinted light in the bathroom and has resolved since changing the slight back to a light without tint or color.  His wife reports that his left foot became very swollen and red last night with patient report that his big toe is also hurting.  He wonders if this is due to his PAD.  He does have follow-up scheduled with Dr. Lucky Cowboy, though this was pushed back to allow for completion of radiation therapy and now will not occur until July.  On exam, some edema and erythema noted with lower extremity Doppler confirming pedal pulses, though diminished on the left side when compared with that of the right.  In the past, he notes  that he has had more pedal edema on the right side than that of the left, so this is unusual for him.  Otherwise, he reports doing well and is glad that he has not had very many side effects with his radiation therapy.  No signs or symptoms  of bleeding.  Medication compliance reported.   Home Medications    Current Outpatient Medications on File Prior to Visit  Medication Sig Dispense Refill   acetaminophen (TYLENOL) 500 MG tablet Take 1,000 mg by mouth every 6 (six) hours as needed for moderate pain.     albuterol (VENTOLIN HFA) 108 (90 Base) MCG/ACT inhaler Inhale 2 puffs into the lungs every 6 (six) hours as needed for wheezing or shortness of breath. 8 g 2   aspirin EC 81 MG tablet Take 81 mg by mouth daily.     cetirizine (ZYRTEC) 10 MG tablet Take 10 mg by mouth daily.     Cholecalciferol (VITAMIN D3) 1000 UNITS CAPS Take 1,000 Units by mouth daily.     donepezil (ARICEPT) 5 MG tablet Take 5 mg by mouth daily.     ELIQUIS 5 MG TABS tablet TAKE 1 TABLET TWICE A DAY  (SWITCHED FROM PLAVIX) 60 tablet 11   ezetimibe (ZETIA) 10 MG tablet TAKE 1 TABLET DAILY 90 tablet 2   Fluticasone-Umeclidin-Vilant (TRELEGY ELLIPTA) 100-62.5-25 MCG/INH AEPB Inhale 1 puff into the lungs daily. 180 each 3   furosemide (LASIX) 20 MG tablet Take 1 tablet (20 mg total) by mouth daily. 30 tablet 5   Iron-Vitamin C (VITRON-C) 65-125 MG TABS Take 1 tablet by mouth daily. 30 tablet 2   isosorbide mononitrate (IMDUR) 30 MG 24 hr tablet TAKE 1 TABLET DAILY 90 tablet 2   metFORMIN (GLUCOPHAGE) 500 MG tablet Take 1 tablet (500 mg total) by mouth 2 (two) times daily with a meal. 180 tablet 1   metoprolol succinate (TOPROL-XL) 25 MG 24 hr tablet TAKE 1 TABLET AT BEDTIME 90 tablet 2   montelukast (SINGULAIR) 10 MG tablet Take 10 mg by mouth daily as needed (sneezing/allergies.).     Multiple Vitamin (MULTIVITAMIN WITH MINERALS) TABS tablet Take 1 tablet by mouth daily. One-A-Day Multivitamin     omeprazole (PRILOSEC) 40 MG  capsule TAKE 1 CAPSULE TWICE DAILY AS NEEDED 180 capsule 3   sacubitril-valsartan (ENTRESTO) 49-51 MG Take 1 tablet by mouth 2 (two) times daily. 60 tablet 3   simvastatin (ZOCOR) 40 MG tablet TAKE 1 TABLET AT BEDTIME 90 tablet 3   spironolactone (ALDACTONE) 25 MG tablet Take 1 tablet (25 mg total) by mouth daily. 90 tablet 3   No current facility-administered medications on file prior to visit.    Review of Systems    He denies chest pain, palpitations, orthopnea, n, v, syncope, edema, weight gain, or early satiety.  No reported signs or symptoms of bleeding.  He reports left pedal edema and erythema with lower extremity Doppler confirming pulses, though reduced on the left side when compared with that of the right.  He reports improved breathing and having to use his inhaler less often.  He is sometimes dizzy right after radiation therapy, though this resolved soon after.  All other systems reviewed and are otherwise negative except as noted above.  Physical Exam    VS:  BP 110/72 (BP Location: Right Arm, Patient Position: Sitting, Cuff Size: Large)   Pulse 68   Ht 5\' 9"  (1.753 m)   Wt 190 lb (86.2 kg)   SpO2 94%   BMI 28.06 kg/m  , BMI Body mass index is 28.06 kg/m. GEN: Elderly male, seated in wheelchair.  Facemask in place.  Joined by his wife. HEENT: normal. Neck: Supple, no JVD, carotid bruits, or masses. Cardiac: RRR, 1/6 systolic murmur RUSB and along the LLSB. No rubs or  gallops. No clubbing, cyanosis.  Mild to moderate nonpitting left lower extremity pedal/ankle edema and erythema with reduced pedal DP/PT pulses 1+ confirmed by Doppler.  Trace nonpitting right lower extremity pedal/ankle edema - DP/PT pulses 2+ confirmed by Doppler.  Radials/ 2+ and equal bilaterally.   Respiratory:  Respirations regular and unlabored, clear to auscultation bilaterally. GI: Soft, nontender, nondistended, BS + x 4. MS: no deformity or atrophy. Skin: warm and dry, no rash. Neuro:  Strength and  sensation are intact. Psych: Normal affect.  Accessory Clinical Findings    ECG personally reviewed by me today -no EKG.  VITALS Reviewed today   Temp Readings from Last 3 Encounters:  05/28/21 97.7 F (36.5 C) (Temporal)  04/16/21 98.6 F (37 C)  04/13/21 98.2 F (36.8 C) (Oral)   BP Readings from Last 3 Encounters:  06/04/21 110/72  05/28/21 128/62  04/23/21 138/74   Pulse Readings from Last 3 Encounters:  06/04/21 68  05/28/21 76  04/23/21 69    Wt Readings from Last 3 Encounters:  06/04/21 190 lb (86.2 kg)  05/28/21 190 lb 3.2 oz (86.3 kg)  05/24/21 190 lb (86.2 kg)     LABS  reviewed today   Lab Results  Component Value Date   WBC 3.8 (L) 05/29/2021   HGB 11.5 (L) 05/29/2021   HCT 36.1 (L) 05/29/2021   MCV 82.0 05/29/2021   PLT 204 05/29/2021   Lab Results  Component Value Date   CREATININE 1.24 05/22/2021   BUN 25 (H) 05/22/2021   NA 138 05/22/2021   K 3.9 05/22/2021   CL 108 05/22/2021   CO2 19 (L) 05/22/2021   Lab Results  Component Value Date   ALT 13 04/16/2021   AST 22 04/16/2021   ALKPHOS 48 04/16/2021   BILITOT 0.8 04/16/2021   Lab Results  Component Value Date   CHOL 130 11/13/2020   HDL 43 11/13/2020   LDLCALC 63 11/13/2020   TRIG 137 11/13/2020   CHOLHDL 3.0 11/13/2020    Lab Results  Component Value Date   HGBA1C 7.7 (A) 11/09/2020   Lab Results  Component Value Date   TSH 1.890 05/08/2020     STUDIES/PROCEDURES reviewed today   [facility] 04/13/21 Diagnostic Dominance: Right     Prox RCA lesion is 30% stenosed. Dist RCA lesion is 50% stenosed. Prox LAD to Mid LAD lesion is 20% stenosed. Ost Cx to Prox Cx lesion is 30% stenosed. 1st Mrg lesion is 40% stenosed. 2nd Diag lesion is 60% stenosed.   1.  Mild to moderate three-vessel coronary artery disease with severe calcifications.  No obstructive lesions. 2.  Left ventricular angiography was not performed.  EF was severely reduced by echo. 3.  Right heart  catheterization showed minimally elevated filling pressures, mild pulmonary hypertension and normal cardiac output.   Recommendations: The patient has nonischemic cardiomyopathy. Continue aggressive medical therapy. Volume status appears good.  CT chest 04/02/2021 IMPRESSION: 1. Interval enlargement of a spiculated nodule of the right lower lobe, measuring 2.5 x 2.1 cm, previously 1.7 x 1.3 cm. 2. Interval enlargement of right hilar, subcarinal, pretracheal, and superior right paratracheal lymph nodes, largest subcarinal node measuring 4.0 x 2.2 cm, previously 2.8 x 0.9 cm. 3. Findings are consistent with enlarging primary lung malignancy and new nodal metastatic disease. 4. Coronary artery disease.   Aortic Atherosclerosis (ICD10-I70.0) and Emphysema (ICD10-J43.9).   Echo 04/05/2021 1. Left ventricular ejection fraction, by estimation, is 20 to 25%. The  left ventricle has severely decreased  function. The left ventricle  demonstrates global hypokinesis. There is mild asymmetric left ventricular  hypertrophy. Left ventricular diastolic   parameters are consistent with Grade II diastolic dysfunction  (pseudonormalization). The average left ventricular global longitudinal  strain is -4.6 %. The global longitudinal strain is abnormal.   2. Right ventricular systolic function is moderately reduced. The right  ventricular size is normal.   3. The mitral valve is degenerative. Mild mitral valve regurgitation.   4. The aortic valve is tricuspid. Aortic valve regurgitation is not  visualized. Mild to moderate aortic valve sclerosis/calcification is  present, without any evidence of aortic stenosis.   5. The inferior vena cava is dilated in size with <50% respiratory  variability, suggesting right atrial pressure of 15 mmHg.   Assessment & Plan    Chronic HFrEF (EF 20-25%), G2DD Nonischemic Cardiomyopathy  --Denies s/sx of worsening volume status. Euvolemic and well compensated on exam.   He has a long history of nonischemic cardiomyopathy.  Echo 04/05/2021 with reduced LVEF 20 to 25%, mild asymmetric LVH, G2DD, RAP 15 mmHg.  Breathing status significantly improved with Lasix 20 mg daily and volume status improved by 4/22 cath. LHC without obstructive disease.  Optimization of medical management recommended at this time.  Unfortunately, he did not tolerate [place] with this medication then discontinued. Continue Entresto 49-51 BID, Lasix 20 mg daily, spironolactone 25mg  daily, Toprol 25mg  qd. Cardiac rehab to start after his last day of radiation therapy at the end of the month (6/28).. Schedule repeat echo to reassess EF at 3 to 6 months on optimized medical management.  Valvular disease --Seen on echo 04/05/21 and noted on recent PET scan. Mild to moderate aortic valve sclerosis / calcification noted on echo without any evidence of stenosis, as well as mild mitral regurgitation with degenerative valve. Continue to monitor with periodic echo as indicated.  We will schedule repeat echo to reassess EF as above, which will also allow for reassessment of his valvular function.  Control of heart rate and blood pressure recommended.   Nonobstructive CAD -- No symptoms concerning for angina.  Catheterization 03/2021 as above without obstructive CAD. EF 20-25% 2/2 NICM with recommendation for optimization of medical management as above.  Continue current medications including ASA, BB, and statin.  Aggressive risk factor modification recommended, including LDL control below 70. Ongoing glycemic control recommended.   Lung cancer / bronchogenic lung carcinoma -- Right lower lobe nodule and lymph node involvement with oncology starting radiation treatment next week.  Continue to follow with oncology as recommended.   CHB s/p PPM -- Previously seen at [facility] for WPW and underwent radiofrequency catheter ablation with subsequent development of bundle branch block and complete heart block, requiring  pacemaker implantation in 2011.  He is s/p generator change out 06/2019 secondary to ER. Device has been used to monitor for any atrial arrhythmia after his splenic infarct as above. Continue to follow with Dr. Klein/electrophysiology as directed. Continue periodic device checks as scheduled.   Hypertension, BP goal <130/80 -- Well controlled today on Entresto with Lasix, Imdur, Toprol, and spironolactone.  He will start to monitor his blood pressure at home 2 hours after his medications are taken in the AM.  HLD, LDL goal <70 --10/2020 LDL 63 and at goal. Continue statin and Zetia with LDL goal below 70.  Splenic infarct/PFO/chronic anticoagulation --TEE, performed 08/2019 after admission for splenic infarct, and showing no evidence of left atrial thrombus or left atrial appendage thrombus.  Subsequent bubble study positive  for PFO.  Continue indefinite anticoagulation with close monitoring for any signs or symptoms of bleeding each visit.  Continue to follow with vascular surgery as directed.    PAD: AAA s/p endovascular repair, R renal artery stent, L SFA stent, L femoral endarterecomy, B/l external iliac stenting LLE Pedal Edema and Erythema -- S/p bilateral external iliac stenting, left SFA stenting, right renal artery stenting, AAA graft repair, left common femoral endarterectomy per vascular surgery.  Continue to follow with vascular surgery. Possibly, earlier imaging needed of LE given LLE swelling and erythema. Pt will contact the office if recurrence or worsening sx. Pedal pulses were confirmed by doppler today, which is reassuring. Continue ASA, statin, and Zetia.  Aggressive risk factor modification with recommendation for LDL below 70 (and previous 10/2020 LDL well controlled at 63).  Medication changes: None. Labs ordered: None (BMET via oncology). Studies / Imaging/ Referrals: Echo to reassess EF ~end of 06/2021 - end of 09/2021 Future considerations:  ?earlier PAD study? Disposition:  RTC after echo or ~ 3- 6 months from optimization of medical management  *Please be aware that the above documentation was completed voice recognition software and may contain dictation errors.     Arvil Chaco, PA-C 06/04/2021

## 2021-06-04 NOTE — Patient Instructions (Signed)
Medication Instructions:  No changes at this time.  *If you need a refill on your cardiac medications before your next appointment, please call your pharmacy*   Lab Work: None  If you have labs (blood work) drawn today and your tests are completely normal, you will receive your results only by: [place] (if you have MyChart) OR A paper copy in the mail If you have any lab test that is abnormal or we need to change your treatment, we will call you to review the results.   Testing/Procedures: Your physician has requested that you have an echocardiogram.   Echocardiography is a painless test that uses sound waves to create images of your heart. It provides your doctor with information about the size and shape of your heart and how well your heart's chambers and valves are working. This procedure takes approximately one hour. There are no restrictions for this procedure.    Follow-Up: At [facility], you and your health needs are our priority.  As part of our continuing mission to provide you with exceptional heart care, we have created designated Provider Care Teams.  These Care Teams include your primary Cardiologist (physician) and Advanced Practice Providers (APPs -  Physician Assistants and Nurse Practitioners) who all work together to provide you with the care you need, when you need it.   Your next appointment:   Follow up after your echocardiogram.   The format for your next appointment:   In Person  Provider:   [name], MD or Marrianne Mood, PA-C

## 2021-06-05 ENCOUNTER — Inpatient Hospital Stay: Payer: Medicare Other

## 2021-06-05 ENCOUNTER — Ambulatory Visit
Admission: RE | Admit: 2021-06-05 | Discharge: 2021-06-05 | Disposition: A | Discharge status: Home / Self Care | Payer: Medicare Other | Source: Ambulatory Visit | Attending: Radiation Oncology | Admitting: Radiation Oncology

## 2021-06-05 DIAGNOSIS — F17210 Nicotine dependence, cigarettes, uncomplicated: Secondary | ICD-10-CM | POA: Diagnosis not present

## 2021-06-05 DIAGNOSIS — E119 Type 2 diabetes mellitus without complications: Secondary | ICD-10-CM | POA: Diagnosis not present

## 2021-06-05 DIAGNOSIS — C3411 Malignant neoplasm of upper lobe, right bronchus or lung: Secondary | ICD-10-CM

## 2021-06-05 DIAGNOSIS — I5042 Chronic combined systolic (congestive) and diastolic (congestive) heart failure: Secondary | ICD-10-CM | POA: Diagnosis not present

## 2021-06-05 LAB — CBC
HCT: 36.2 % — ABNORMAL LOW (ref 39.0–52.0)
Hemoglobin: 11.6 g/dL — ABNORMAL LOW (ref 13.0–17.0)
MCH: 26.2 pg (ref 26.0–34.0)
MCHC: 32 g/dL (ref 30.0–36.0)
MCV: 81.9 fL (ref 80.0–100.0)
Platelets: 164 10*3/uL (ref 150–400)
RBC: 4.42 MIL/uL (ref 4.22–5.81)
RDW: 19.6 % — ABNORMAL HIGH (ref 11.5–15.5)
WBC: 3.6 10*3/uL — ABNORMAL LOW (ref 4.0–10.5)
nRBC: 0 % (ref 0.0–0.2)

## 2021-06-06 ENCOUNTER — Ambulatory Visit: Payer: Medicare Other

## 2021-06-06 ENCOUNTER — Encounter: Payer: Self-pay | Admitting: *Deleted

## 2021-06-06 ENCOUNTER — Ambulatory Visit
Admission: RE | Admit: 2021-06-06 | Discharge: 2021-06-06 | Disposition: A | Discharge status: Home / Self Care | Payer: Medicare Other | Source: Ambulatory Visit | Attending: Radiation Oncology | Admitting: Radiation Oncology

## 2021-06-06 ENCOUNTER — Encounter: Payer: Medicare Other | Admitting: Speech Pathology

## 2021-06-06 DIAGNOSIS — I5042 Chronic combined systolic (congestive) and diastolic (congestive) heart failure: Secondary | ICD-10-CM | POA: Diagnosis not present

## 2021-06-06 DIAGNOSIS — F17210 Nicotine dependence, cigarettes, uncomplicated: Secondary | ICD-10-CM | POA: Diagnosis not present

## 2021-06-06 DIAGNOSIS — E119 Type 2 diabetes mellitus without complications: Secondary | ICD-10-CM | POA: Diagnosis not present

## 2021-06-06 DIAGNOSIS — C3411 Malignant neoplasm of upper lobe, right bronchus or lung: Secondary | ICD-10-CM | POA: Diagnosis not present

## 2021-06-06 NOTE — Progress Notes (Signed)
Cardiac Individual Treatment Plan  Patient Details  Name: Howard [name] MRN: [id number] Date of Birth: [birth year] Referring Provider:   Flowsheet Row Cardiac Rehab from 05/24/2021 in [facility] Cardiac and Pulmonary Rehab  Referring Provider Gollan       Initial Encounter Date:  Flowsheet Row Cardiac Rehab from 05/24/2021 in [facility] Cardiac and Pulmonary Rehab  Date 05/24/21       Visit Diagnosis: Heart failure, systolic and diastolic, chronic ([place])  Patient's Home Medications on Admission:  Current Outpatient Medications:    acetaminophen (TYLENOL) 500 MG tablet, Take 1,000 mg by mouth every 6 (six) hours as needed for moderate pain., Disp: , Rfl:    albuterol (VENTOLIN HFA) 108 (90 Base) MCG/ACT inhaler, Inhale 2 puffs into the lungs every 6 (six) hours as needed for wheezing or shortness of breath., Disp: 8 g, Rfl: 2   aspirin EC 81 MG tablet, Take 81 mg by mouth daily., Disp: , Rfl:    cetirizine (ZYRTEC) 10 MG tablet, Take 10 mg by mouth daily., Disp: , Rfl:    Cholecalciferol (VITAMIN D3) 1000 UNITS CAPS, Take 1,000 Units by mouth daily., Disp: , Rfl:    donepezil (ARICEPT) 5 MG tablet, Take 5 mg by mouth daily., Disp: , Rfl:    ELIQUIS 5 MG TABS tablet, TAKE 1 TABLET TWICE A DAY  (SWITCHED FROM PLAVIX), Disp: 60 tablet, Rfl: 11   ezetimibe (ZETIA) 10 MG tablet, TAKE 1 TABLET DAILY, Disp: 90 tablet, Rfl: 2   Fluticasone-Umeclidin-Vilant (TRELEGY ELLIPTA) 100-62.5-25 MCG/INH AEPB, Inhale 1 puff into the lungs daily., Disp: 180 each, Rfl: 3   furosemide (LASIX) 20 MG tablet, Take 1 tablet (20 mg total) by mouth daily., Disp: 30 tablet, Rfl: 5   Iron-Vitamin C (VITRON-C) 65-125 MG TABS, Take 1 tablet by mouth daily., Disp: 30 tablet, Rfl: 2   isosorbide mononitrate (IMDUR) 30 MG 24 hr tablet, TAKE 1 TABLET DAILY, Disp: 90 tablet, Rfl: 2   metFORMIN (GLUCOPHAGE) 500 MG tablet, Take 1 tablet (500 mg total) by mouth 2 (two) times daily with a meal., Disp: 180 tablet, Rfl: 1   metoprolol  succinate (TOPROL-XL) 25 MG 24 hr tablet, TAKE 1 TABLET AT BEDTIME, Disp: 90 tablet, Rfl: 2   montelukast (SINGULAIR) 10 MG tablet, Take 10 mg by mouth daily as needed (sneezing/allergies.)., Disp: , Rfl:    Multiple Vitamin (MULTIVITAMIN WITH MINERALS) TABS tablet, Take 1 tablet by mouth daily. One-A-Day Multivitamin, Disp: , Rfl:    omeprazole (PRILOSEC) 40 MG capsule, TAKE 1 CAPSULE TWICE DAILY AS NEEDED, Disp: 180 capsule, Rfl: 3   sacubitril-valsartan (ENTRESTO) 49-51 MG, Take 1 tablet by mouth 2 (two) times daily., Disp: 180 tablet, Rfl: 0   simvastatin (ZOCOR) 40 MG tablet, TAKE 1 TABLET AT BEDTIME, Disp: 90 tablet, Rfl: 3   spironolactone (ALDACTONE) 25 MG tablet, Take 1 tablet (25 mg total) by mouth daily., Disp: 90 tablet, Rfl: 3  Past Medical History: Past Medical History:  Diagnosis Date   Arthritis    Bell palsy    Bell's palsy 04/12/2015   Cancer [facility])    prostate and skin   Chronic combined systolic and diastolic CHF, NYHA class 1 ([place])    a. 07/2014 Echo: EF 35-40%, Gr 1 DD.   Complete heart block ([place])    a. 11/2010 s/p SJM 2210 [facility] DC PPM, ser# [id number].   Depression    Diabetes mellitus without complication [facility])    Fall 11-10-14   GERD (gastroesophageal reflux disease)  History of prostate cancer    Hyperlipidemia    Hypertension    LBBB (left bundle branch block)    Left-sided Bell's palsy    Lung cancer ([place]) 2016   NICM (nonischemic cardiomyopathy) ([place])    a. 07/2014 Echo: EF 35-40%, mid-apicalanteroseptal DK, Gr 1 DD, mild-mod dil LA.   Non-obstructive CAD    a. 07/2014 Abnl MV;  b. 08/2014 Cath: LM nl, LAD 30p, RI 40p, LCX nl, OM1 40, RCA dominant 30p, 70d-->Med Rx.   Poor balance    Presence of permanent cardiac pacemaker    Sleep apnea    a. cpap   Vertigo    WPW (Wolff-Parkinson-White syndrome)    a. S/P RFCA 1991.    Tobacco Use: Social History   Tobacco Use  Smoking Status Former   Packs/day: 1.00   Years: 56.00   Pack years: 56.00    Types: Cigarettes   Quit date: 2011   Years since quitting: 11.4  Smokeless Tobacco Never    Labs: Recent Review Scientist, physiological     Labs for ITP Cardiac and Pulmonary Rehab Latest Ref Rng & Units 09/07/2019 12/10/2019 05/08/2020 11/09/2020 11/13/2020   Cholestrol 100 - 199 mg/dL - 136 134 - 130   LDLCALC 0 - 99 mg/dL - 75 66 - 63   HDL >39 mg/dL - 42 52 - 43   Trlycerides 0 - 149 mg/dL - 93 83 - 137   Hemoglobin A1c 4.0 - 5.6 % 7.2(H) 7.5(H) 7.4(H) 7.7(A) -   HCO3 20.0 - 24.0 mEq/L - - - - -   TCO2 0 - 100 mmol/L - - - - -   O2SAT % - - - - -        Exercise Target Goals: Exercise Program Goal: Individual exercise prescription set using results from initial 6 min walk test and THRR while considering  patient's activity barriers and safety.   Exercise Prescription Goal: Initial exercise prescription builds to 30-45 minutes a day of aerobic activity, 2-3 days per week.  Home exercise guidelines will be given to patient during program as part of exercise prescription that the participant will acknowledge.   Education: Aerobic Exercise: - Group verbal and visual presentation on the components of exercise prescription. Introduces F.I.T.T principle from ACSM for exercise prescriptions.  Reviews F.I.T.T. principles of aerobic exercise including progression. Written material given at graduation.   Education: Resistance Exercise: - Group verbal and visual presentation on the components of exercise prescription. Introduces F.I.T.T principle from ACSM for exercise prescriptions  Reviews F.I.T.T. principles of resistance exercise including progression. Written material given at graduation.    Education: Exercise & Equipment Safety: - Individual verbal instruction and demonstration of equipment use and safety with use of the equipment. Flowsheet Row Cardiac Rehab from 05/24/2021 in [facility] Cardiac and Pulmonary Rehab  Date 05/24/21  Educator AS  Instruction Review Code 1- Verbalizes  Understanding       Education: Exercise Physiology & General Exercise Guidelines: - Group verbal and written instruction with models to review the exercise physiology of the cardiovascular system and associated critical values. Provides general exercise guidelines with specific guidelines to those with heart or lung disease.    Education: Flexibility, Balance, Mind/Body Relaxation: - Group verbal and visual presentation with interactive activity on the components of exercise prescription. Introduces F.I.T.T principle from ACSM for exercise prescriptions. Reviews F.I.T.T. principles of flexibility and balance exercise training including progression. Also discusses the mind body connection.  Reviews various relaxation techniques to help  reduce and manage stress (i.e. Deep breathing, progressive muscle relaxation, and visualization). Balance handout provided to take home. Written material given at graduation.   Activity Barriers & Risk Stratification:  Activity Barriers & Cardiac Risk Stratification - 05/14/21 1406       Activity Barriers & Cardiac Risk Stratification   Activity Barriers Back Problems;Joint Problems;Decreased Ventricular Function;Muscular Weakness    Cardiac Risk Stratification High             6 Minute Walk:  6 Minute Walk     Row Name 05/24/21 1531         6 Minute Walk   Distance 590 feet     Walk Time 5 minutes     # of Rest Breaks 2     MPH 1.34     METS 1.1     RPE 17     Perceived Dyspnea  3     VO2 Peak 3.86     Symptoms Yes (comment)     Comments leg pain/cramping due to vascular issue 5/10     Resting HR 90 bpm     Resting BP 126/62     Resting Oxygen Saturation  98 %     Exercise Oxygen Saturation  during 6 min walk 96 %     Max Ex. HR 118 bpm     Max Ex. BP 156/72     2 Minute Post BP 140/70              Oxygen Initial Assessment:   Oxygen Re-Evaluation:   Oxygen Discharge (Final Oxygen Re-Evaluation):   Initial Exercise  Prescription:  Initial Exercise Prescription - 05/24/21 1500       Date of Initial Exercise RX and Referring Provider   Date 05/24/21    Referring Provider Gollan      NuStep   Level 1    SPM 80    Minutes 15    METs 1.1      Recumbant Elliptical   Level 1    RPM 50    Minutes 15    METs 1.1      REL-XR   Level 1    Speed 50    Minutes 15    METs 1.1      Track   Laps 10    Minutes 15      Prescription Details   Frequency (times per week) 3    Duration Progress to 30 minutes of continuous aerobic without signs/symptoms of physical distress      Intensity   THRR 40-80% of Max Heartrate 107-124    Ratings of Perceived Exertion 11-13    Perceived Dyspnea 0-4      Progression   Progression Continue to progress workloads to maintain intensity without signs/symptoms of physical distress.      Resistance Training   Training Prescription Yes    Weight 3 lb    Reps 10-15             Perform Capillary Blood Glucose checks as needed.  Exercise Prescription Changes:   Exercise Prescription Changes     Row Name 05/24/21 1600             Response to Exercise   Blood Pressure (Admit) 126/62       Blood Pressure (Exercise) 156/72       Blood Pressure (Exit) 142/70       Heart Rate (Admit) 90 bpm       Heart Rate (Exercise)  118 bpm       Heart Rate (Exit) 93 bpm       Oxygen Saturation (Admit) 98 %       Oxygen Saturation (Exercise) 96 %       Rating of Perceived Exertion (Exercise) 17       Perceived Dyspnea (Exercise) 3       Symptoms 5/10 leg cramp/pain                Exercise Comments:   Exercise Goals and Review:   Exercise Goals     Row Name 05/24/21 1602             Exercise Goals   Increase Physical Activity Yes       Intervention Provide advice, education, support and counseling about physical activity/exercise needs.;Develop an individualized exercise prescription for aerobic and resistive training based on initial evaluation  findings, risk stratification, comorbidities and participant's personal goals.       Expected Outcomes Short Term: Attend rehab on a regular basis to increase amount of physical activity.;Long Term: Add in home exercise to make exercise part of routine and to increase amount of physical activity.;Long Term: Exercising regularly at least 3-5 days a week.       Increase Strength and Stamina Yes       Intervention Provide advice, education, support and counseling about physical activity/exercise needs.;Develop an individualized exercise prescription for aerobic and resistive training based on initial evaluation findings, risk stratification, comorbidities and participant's personal goals.       Expected Outcomes Short Term: Increase workloads from initial exercise prescription for resistance, speed, and METs.;Long Term: Improve cardiorespiratory fitness, muscular endurance and strength as measured by increased METs and functional capacity (6MWT)       Able to understand and use rate of perceived exertion (RPE) scale Yes       Intervention Provide education and explanation on how to use RPE scale       Expected Outcomes Short Term: Able to use RPE daily in rehab to express subjective intensity level;Long Term:  Able to use RPE to guide intensity level when exercising independently       Able to understand and use Dyspnea scale Yes       Intervention Provide education and explanation on how to use Dyspnea scale       Expected Outcomes Short Term: Able to use Dyspnea scale daily in rehab to express subjective sense of shortness of breath during exertion;Long Term: Able to use Dyspnea scale to guide intensity level when exercising independently       Knowledge and understanding of Target Heart Rate Range (THRR) Yes       Intervention Provide education and explanation of THRR including how the numbers were predicted and where they are located for reference       Expected Outcomes Short Term: Able to state/look  up THRR;Short Term: Able to use daily as guideline for intensity in rehab;Long Term: Able to use THRR to govern intensity when exercising independently       Able to check pulse independently Yes       Intervention Provide education and demonstration on how to check pulse in carotid and radial arteries.;Review the importance of being able to check your own pulse for safety during independent exercise       Expected Outcomes Short Term: Able to explain why pulse checking is important during independent exercise;Long Term: Able to check pulse independently and accurately  Understanding of Exercise Prescription Yes       Intervention Provide education, explanation, and written materials on patient's individual exercise prescription       Expected Outcomes Short Term: Able to explain program exercise prescription;Long Term: Able to explain home exercise prescription to exercise independently                Exercise Goals Re-Evaluation :   Discharge Exercise Prescription (Final Exercise Prescription Changes):  Exercise Prescription Changes - 05/24/21 1600       Response to Exercise   Blood Pressure (Admit) 126/62    Blood Pressure (Exercise) 156/72    Blood Pressure (Exit) 142/70    Heart Rate (Admit) 90 bpm    Heart Rate (Exercise) 118 bpm    Heart Rate (Exit) 93 bpm    Oxygen Saturation (Admit) 98 %    Oxygen Saturation (Exercise) 96 %    Rating of Perceived Exertion (Exercise) 17    Perceived Dyspnea (Exercise) 3    Symptoms 5/10 leg cramp/pain             Nutrition:  Target Goals: Understanding of nutrition guidelines, daily intake of sodium 1500mg , cholesterol 200mg , calories 30% from fat and 7% or less from saturated fats, daily to have 5 or more servings of fruits and vegetables.  Education: All About Nutrition: -Group instruction provided by verbal, written material, interactive activities, discussions, models, and posters to present general guidelines for heart  healthy nutrition including fat, fiber, MyPlate, the role of sodium in heart healthy nutrition, utilization of the nutrition label, and utilization of this knowledge for meal planning. Follow up email sent as well. Written material given at graduation.   Biometrics:  Pre Biometrics - 05/24/21 1603       Pre Biometrics   Height 5' 9.5" (1.765 m)    Weight 190 lb (86.2 kg)    BMI (Calculated) 27.67              Nutrition Therapy Plan and Nutrition Goals:   Nutrition Assessments:  MEDIFICTS Score Key: ?70 Need to make dietary changes  40-70 Heart Healthy Diet ? 40 Therapeutic Level Cholesterol Diet  Flowsheet Row Cardiac Rehab from 05/24/2021 in [facility] Cardiac and Pulmonary Rehab  Picture Your Plate Total Score on Admission 56      Picture Your Plate Scores: <17 Unhealthy dietary pattern with much room for improvement. 41-50 Dietary pattern unlikely to meet recommendations for good health and room for improvement. 51-60 More healthful dietary pattern, with some room for improvement.  >60 Healthy dietary pattern, although there may be some specific behaviors that could be improved.    Nutrition Goals Re-Evaluation:   Nutrition Goals Discharge (Final Nutrition Goals Re-Evaluation):   Psychosocial: Target Goals: Acknowledge presence or absence of significant depression and/or stress, maximize coping skills, provide positive support system. Participant is able to verbalize types and ability to use techniques and skills needed for reducing stress and depression.   Education: Stress, Anxiety, and Depression - Group verbal and visual presentation to define topics covered.  Reviews how body is impacted by stress, anxiety, and depression.  Also discusses healthy ways to reduce stress and to treat/manage anxiety and depression.  Written material given at graduation.   Education: Sleep Hygiene -Provides group verbal and written instruction about how sleep can affect your health.   Define sleep hygiene, discuss sleep cycles and impact of sleep habits. Review good sleep hygiene tips.    Initial Review & Psychosocial Screening:  Initial Psych  Review & Screening - 05/14/21 1411       Initial Review   Current issues with Current Stress Concerns    Source of Stress Concerns Unable to participate in former interests or hobbies;Unable to perform yard/household activities;Chronic Illness      Family Dynamics   Good Support System? Yes   wife     Barriers   Psychosocial barriers to participate in program There are no identifiable barriers or psychosocial needs.;The patient should benefit from training in stress management and relaxation.      Screening Interventions   Interventions Encouraged to exercise;Provide feedback about the scores to participant;To provide support and resources with identified psychosocial needs;Program counselor consult    Expected Outcomes Short Term goal: Utilizing psychosocial counselor, staff and physician to assist with identification of specific Stressors or current issues interfering with healing process. Setting desired goal for each stressor or current issue identified.;Long Term Goal: Stressors or current issues are controlled or eliminated.;Short Term goal: Identification and review with participant of any Quality of Life or Depression concerns found by scoring the questionnaire.;Long Term goal: The participant improves quality of Life and PHQ9 Scores as seen by post scores and/or verbalization of changes             Quality of Life Scores:   Quality of Life - 05/24/21 1611       Quality of Life   Select Quality of Life      Quality of Life Scores   Health/Function Pre 23.9 %    Socioeconomic Pre 30 %    Psych/Spiritual Pre 28.29 %    Family Pre 29.5 %    GLOBAL Pre 26.79 %            Scores of 19 and below usually indicate a poorer quality of life in these areas.  A difference of  2-3 points is a clinically meaningful  difference.  A difference of 2-3 points in the total score of the Quality of Life Index has been associated with significant improvement in overall quality of life, self-image, physical symptoms, and general health in studies assessing change in quality of life.  PHQ-9: Recent Review Flowsheet Data     Depression screen [facility] 2/9 05/24/2021 11/08/2020 11/08/2019   Decreased Interest 0 0 0   Down, Depressed, Hopeless 0 0 0   PHQ - 2 Score 0 0 0   Altered sleeping 0 - -   Tired, decreased energy 0 - -   Change in appetite 0 - -   Feeling bad or failure about yourself  0 - -   Trouble concentrating 0 - -   Moving slowly or fidgety/restless 0 - -   Suicidal thoughts 0 - -   PHQ-9 Score 0 - -      Interpretation of Total Score  Total Score Depression Severity:  1-4 = Minimal depression, 5-9 = Mild depression, 10-14 = Moderate depression, 15-19 = Moderately severe depression, 20-27 = Severe depression   Psychosocial Evaluation and Intervention:  Psychosocial Evaluation - 05/14/21 1416       Psychosocial Evaluation & Interventions   Interventions Encouraged to exercise with the program and follow exercise prescription;Stress management education    Comments Minas reports doing well. His hearing is a big stressor for him, so he relies on his wife's help in this area as well as managing his health care. He states he is sleeping well now that they have gotten his medication adjusted. He did have some back issues  that have caused walking difficulties at times and increased his weakness which has led him to not doing whatever he wants, so he is looking forward to working on his balance and his stamina. He is also starting radiation medication so he is a little hesitant as to how he will feel during treatment.    Expected Outcomes Short; attend Cardiac Rehab for education and Exercise. Long: develop and maintain positive self care habits.    Continue Psychosocial Services  Follow up required by staff              Psychosocial Re-Evaluation:   Psychosocial Discharge (Final Psychosocial Re-Evaluation):   Vocational Rehabilitation: Provide vocational rehab assistance to qualifying candidates.   Vocational Rehab Evaluation & Intervention:  Vocational Rehab - 05/14/21 1411       Initial Vocational Rehab Evaluation & Intervention   Assessment shows need for Vocational Rehabilitation No             Education: Education Goals: Education classes will be provided on a variety of topics geared toward better understanding of heart health and risk factor modification. Participant will state understanding/return demonstration of topics presented as noted by education test scores.  Learning Barriers/Preferences:  Learning Barriers/Preferences - 05/14/21 1406       Learning Barriers/Preferences   Learning Barriers Hearing    Learning Preferences Individual Instruction             General Cardiac Education Topics:  AED/CPR: - Group verbal and written instruction with the use of models to demonstrate the basic use of the AED with the basic ABC's of resuscitation.   Anatomy and Cardiac Procedures: - Group verbal and visual presentation and models provide information about basic cardiac anatomy and function. Reviews the testing methods done to diagnose heart disease and the outcomes of the test results. Describes the treatment choices: Medical Management, Angioplasty, or Coronary Bypass Surgery for treating various heart conditions including Myocardial Infarction, Angina, Valve Disease, and Cardiac Arrhythmias.  Written material given at graduation.   Medication Safety: - Group verbal and visual instruction to review commonly prescribed medications for heart and lung disease. Reviews the medication, class of the drug, and side effects. Includes the steps to properly store meds and maintain the prescription regimen.  Written material given at graduation.   Intimacy: - Group  verbal instruction through game format to discuss how heart and lung disease can affect sexual intimacy. Written material given at graduation..   Know Your Numbers and Heart Failure: - Group verbal and visual instruction to discuss disease risk factors for cardiac and pulmonary disease and treatment options.  Reviews associated critical values for Overweight/Obesity, Hypertension, Cholesterol, and Diabetes.  Discusses basics of heart failure: signs/symptoms and treatments.  Introduces Heart Failure Zone chart for action plan for heart failure.  Written material given at graduation.   Infection Prevention: - Provides verbal and written material to individual with discussion of infection control including proper hand washing and proper equipment cleaning during exercise session. Flowsheet Row Cardiac Rehab from 05/24/2021 in [facility] Cardiac and Pulmonary Rehab  Date 05/24/21  Educator AS  Instruction Review Code 1- Verbalizes Understanding       Falls Prevention: - Provides verbal and written material to individual with discussion of falls prevention and safety. Flowsheet Row Cardiac Rehab from 05/24/2021 in [facility] Cardiac and Pulmonary Rehab  Date 05/24/21  Educator AS  Instruction Review Code 1- Verbalizes Understanding       Other: -Provides group and verbal instruction on various  topics (see comments)   Knowledge Questionnaire Score:   Core Components/Risk Factors/Patient Goals at Admission:  Personal Goals and Risk Factors at Admission - 05/24/21 1604       Core Components/Risk Factors/Patient Goals on Admission   Diabetes Yes    Intervention Provide education about signs/symptoms and action to take for hypo/hyperglycemia.;Provide education about proper nutrition, including hydration, and aerobic/resistive exercise prescription along with prescribed medications to achieve blood glucose in normal ranges: Fasting glucose 65-99 mg/dL    Expected Outcomes Short Term: Participant  verbalizes understanding of the signs/symptoms and immediate care of hyper/hypoglycemia, proper foot care and importance of medication, aerobic/resistive exercise and nutrition plan for blood glucose control.;Long Term: Attainment of HbA1C < 7%.    Heart Failure Yes    Intervention Provide a combined exercise and nutrition program that is supplemented with education, support and counseling about heart failure. Directed toward relieving symptoms such as shortness of breath, decreased exercise tolerance, and extremity edema.    Expected Outcomes Improve functional capacity of life;Short term: Attendance in program 2-3 days a week with increased exercise capacity. Reported lower sodium intake. Reported increased fruit and vegetable intake. Reports medication compliance.;Short term: Daily weights obtained and reported for increase. Utilizing diuretic protocols set by physician.;Long term: Adoption of self-care skills and reduction of barriers for early signs and symptoms recognition and intervention leading to self-care maintenance.    Hypertension Yes    Intervention Provide education on lifestyle modifcations including regular physical activity/exercise, weight management, moderate sodium restriction and increased consumption of fresh fruit, vegetables, and low fat dairy, alcohol moderation, and smoking cessation.;Monitor prescription use compliance.    Expected Outcomes Short Term: Continued assessment and intervention until BP is < 140/10mm HG in hypertensive participants. < 130/15mm HG in hypertensive participants with diabetes, heart failure or chronic kidney disease.;Long Term: Maintenance of blood pressure at goal levels.             Education:Diabetes - Individual verbal and written instruction to review signs/symptoms of diabetes, desired ranges of glucose level fasting, after meals and with exercise. Acknowledge that pre and post exercise glucose checks will be done for 3 sessions at entry of  program. [place] from 05/14/2021 in [facility] Cardiac and Pulmonary Rehab  Date 05/14/21  Educator [facility]  Instruction Review Code 1- Verbalizes Understanding       Core Components/Risk Factors/Patient Goals Review:    Core Components/Risk Factors/Patient Goals at Discharge (Final Review):    ITP Comments:  ITP Comments     Old Agency Name 05/14/21 1404 05/24/21 1617 06/06/21 0750       ITP Comments Initial telephone orientation completed. Diagnosis can be found in [facility] 5/2. EP orientation scheduled for Thursday 6/2 at 2pm. Completed 6MWT and gym orientation. Initial ITP created and sent for review to Dr. Emily Filbert, Medical Director. 30 Day review completed. Medical Director ITP review done, changes made as directed, and signed approval by Medical Director.  new to program              Comments:

## 2021-06-07 ENCOUNTER — Ambulatory Visit
Admission: RE | Admit: 2021-06-07 | Discharge: 2021-06-07 | Disposition: A | Discharge status: Home / Self Care | Payer: Medicare Other | Source: Ambulatory Visit | Attending: Radiation Oncology | Admitting: Radiation Oncology

## 2021-06-07 DIAGNOSIS — C3411 Malignant neoplasm of upper lobe, right bronchus or lung: Secondary | ICD-10-CM | POA: Diagnosis not present

## 2021-06-07 DIAGNOSIS — E119 Type 2 diabetes mellitus without complications: Secondary | ICD-10-CM | POA: Diagnosis not present

## 2021-06-07 DIAGNOSIS — F17210 Nicotine dependence, cigarettes, uncomplicated: Secondary | ICD-10-CM | POA: Diagnosis not present

## 2021-06-07 DIAGNOSIS — I5042 Chronic combined systolic (congestive) and diastolic (congestive) heart failure: Secondary | ICD-10-CM | POA: Diagnosis not present

## 2021-06-08 ENCOUNTER — Ambulatory Visit
Admission: RE | Admit: 2021-06-08 | Discharge: 2021-06-08 | Disposition: A | Discharge status: Home / Self Care | Payer: Medicare Other | Source: Ambulatory Visit | Attending: Radiation Oncology | Admitting: Radiation Oncology

## 2021-06-08 DIAGNOSIS — C3411 Malignant neoplasm of upper lobe, right bronchus or lung: Secondary | ICD-10-CM | POA: Diagnosis not present

## 2021-06-08 DIAGNOSIS — I5042 Chronic combined systolic (congestive) and diastolic (congestive) heart failure: Secondary | ICD-10-CM | POA: Diagnosis not present

## 2021-06-08 DIAGNOSIS — F17210 Nicotine dependence, cigarettes, uncomplicated: Secondary | ICD-10-CM | POA: Diagnosis not present

## 2021-06-08 DIAGNOSIS — E119 Type 2 diabetes mellitus without complications: Secondary | ICD-10-CM | POA: Diagnosis not present

## 2021-06-11 ENCOUNTER — Ambulatory Visit: Payer: Medicare Other

## 2021-06-11 ENCOUNTER — Ambulatory Visit
Admission: RE | Admit: 2021-06-11 | Discharge: 2021-06-11 | Disposition: A | Discharge status: Home / Self Care | Payer: Medicare Other | Source: Ambulatory Visit | Attending: Radiation Oncology | Admitting: Radiation Oncology

## 2021-06-11 DIAGNOSIS — F17210 Nicotine dependence, cigarettes, uncomplicated: Secondary | ICD-10-CM | POA: Diagnosis not present

## 2021-06-11 DIAGNOSIS — E119 Type 2 diabetes mellitus without complications: Secondary | ICD-10-CM | POA: Diagnosis not present

## 2021-06-11 DIAGNOSIS — I5042 Chronic combined systolic (congestive) and diastolic (congestive) heart failure: Secondary | ICD-10-CM | POA: Diagnosis not present

## 2021-06-11 DIAGNOSIS — C3411 Malignant neoplasm of upper lobe, right bronchus or lung: Secondary | ICD-10-CM | POA: Diagnosis not present

## 2021-06-12 ENCOUNTER — Encounter: Payer: Medicare Other | Admitting: Speech Pathology

## 2021-06-12 ENCOUNTER — Ambulatory Visit
Admission: RE | Admit: 2021-06-12 | Discharge: 2021-06-12 | Disposition: A | Discharge status: Home / Self Care | Payer: Medicare Other | Source: Ambulatory Visit | Attending: Radiation Oncology | Admitting: Radiation Oncology

## 2021-06-12 ENCOUNTER — Inpatient Hospital Stay: Payer: Medicare Other

## 2021-06-12 ENCOUNTER — Ambulatory Visit: Payer: Medicare Other

## 2021-06-12 DIAGNOSIS — I5042 Chronic combined systolic (congestive) and diastolic (congestive) heart failure: Secondary | ICD-10-CM | POA: Diagnosis not present

## 2021-06-12 DIAGNOSIS — C3411 Malignant neoplasm of upper lobe, right bronchus or lung: Secondary | ICD-10-CM

## 2021-06-12 DIAGNOSIS — F17210 Nicotine dependence, cigarettes, uncomplicated: Secondary | ICD-10-CM | POA: Diagnosis not present

## 2021-06-12 DIAGNOSIS — E119 Type 2 diabetes mellitus without complications: Secondary | ICD-10-CM | POA: Diagnosis not present

## 2021-06-12 LAB — CBC
HCT: 36.8 % — ABNORMAL LOW (ref 39.0–52.0)
Hemoglobin: 11.8 g/dL — ABNORMAL LOW (ref 13.0–17.0)
MCH: 26.4 pg (ref 26.0–34.0)
MCHC: 32.1 g/dL (ref 30.0–36.0)
MCV: 82.3 fL (ref 80.0–100.0)
Platelets: 185 10*3/uL (ref 150–400)
RBC: 4.47 MIL/uL (ref 4.22–5.81)
RDW: 19.5 % — ABNORMAL HIGH (ref 11.5–15.5)
WBC: 4.2 10*3/uL (ref 4.0–10.5)
nRBC: 0 % (ref 0.0–0.2)

## 2021-06-13 ENCOUNTER — Ambulatory Visit
Admission: RE | Admit: 2021-06-13 | Discharge: 2021-06-13 | Disposition: A | Discharge status: Home / Self Care | Payer: Medicare Other | Source: Ambulatory Visit | Attending: Radiation Oncology | Admitting: Radiation Oncology

## 2021-06-13 DIAGNOSIS — F17210 Nicotine dependence, cigarettes, uncomplicated: Secondary | ICD-10-CM | POA: Diagnosis not present

## 2021-06-13 DIAGNOSIS — E119 Type 2 diabetes mellitus without complications: Secondary | ICD-10-CM | POA: Diagnosis not present

## 2021-06-13 DIAGNOSIS — C3411 Malignant neoplasm of upper lobe, right bronchus or lung: Secondary | ICD-10-CM | POA: Diagnosis not present

## 2021-06-13 DIAGNOSIS — I5042 Chronic combined systolic (congestive) and diastolic (congestive) heart failure: Secondary | ICD-10-CM | POA: Diagnosis not present

## 2021-06-14 ENCOUNTER — Ambulatory Visit: Payer: Medicare Other

## 2021-06-14 ENCOUNTER — Encounter: Payer: Medicare Other | Admitting: Speech Pathology

## 2021-06-14 ENCOUNTER — Ambulatory Visit
Admission: RE | Admit: 2021-06-14 | Discharge: 2021-06-14 | Disposition: A | Discharge status: Home / Self Care | Payer: Medicare Other | Source: Ambulatory Visit | Attending: Radiation Oncology | Admitting: Radiation Oncology

## 2021-06-14 DIAGNOSIS — C3411 Malignant neoplasm of upper lobe, right bronchus or lung: Secondary | ICD-10-CM | POA: Diagnosis not present

## 2021-06-14 DIAGNOSIS — I5042 Chronic combined systolic (congestive) and diastolic (congestive) heart failure: Secondary | ICD-10-CM | POA: Diagnosis not present

## 2021-06-14 DIAGNOSIS — E119 Type 2 diabetes mellitus without complications: Secondary | ICD-10-CM | POA: Diagnosis not present

## 2021-06-14 DIAGNOSIS — F17210 Nicotine dependence, cigarettes, uncomplicated: Secondary | ICD-10-CM | POA: Diagnosis not present

## 2021-06-15 ENCOUNTER — Ambulatory Visit
Admission: RE | Admit: 2021-06-15 | Discharge: 2021-06-15 | Disposition: A | Discharge status: Home / Self Care | Payer: Medicare Other | Source: Ambulatory Visit | Attending: Radiation Oncology | Admitting: Radiation Oncology

## 2021-06-15 DIAGNOSIS — F17210 Nicotine dependence, cigarettes, uncomplicated: Secondary | ICD-10-CM | POA: Diagnosis not present

## 2021-06-15 DIAGNOSIS — I5042 Chronic combined systolic (congestive) and diastolic (congestive) heart failure: Secondary | ICD-10-CM | POA: Diagnosis not present

## 2021-06-15 DIAGNOSIS — C3411 Malignant neoplasm of upper lobe, right bronchus or lung: Secondary | ICD-10-CM | POA: Diagnosis not present

## 2021-06-15 DIAGNOSIS — E119 Type 2 diabetes mellitus without complications: Secondary | ICD-10-CM | POA: Diagnosis not present

## 2021-06-16 ENCOUNTER — Ambulatory Visit: Payer: Medicare Other

## 2021-06-18 ENCOUNTER — Ambulatory Visit: Payer: Medicare Other

## 2021-06-18 ENCOUNTER — Ambulatory Visit
Admission: RE | Admit: 2021-06-18 | Discharge: 2021-06-18 | Disposition: A | Discharge status: Home / Self Care | Payer: Medicare Other | Source: Ambulatory Visit | Attending: Radiation Oncology | Admitting: Radiation Oncology

## 2021-06-18 DIAGNOSIS — F17210 Nicotine dependence, cigarettes, uncomplicated: Secondary | ICD-10-CM | POA: Diagnosis not present

## 2021-06-18 DIAGNOSIS — E119 Type 2 diabetes mellitus without complications: Secondary | ICD-10-CM | POA: Diagnosis not present

## 2021-06-18 DIAGNOSIS — C3411 Malignant neoplasm of upper lobe, right bronchus or lung: Secondary | ICD-10-CM | POA: Diagnosis not present

## 2021-06-18 DIAGNOSIS — I5042 Chronic combined systolic (congestive) and diastolic (congestive) heart failure: Secondary | ICD-10-CM | POA: Diagnosis not present

## 2021-06-19 ENCOUNTER — Encounter: Payer: Medicare Other | Admitting: Speech Pathology

## 2021-06-19 ENCOUNTER — Ambulatory Visit
Admission: RE | Admit: 2021-06-19 | Discharge: 2021-06-19 | Disposition: A | Discharge status: Home / Self Care | Payer: Medicare Other | Source: Ambulatory Visit | Attending: Radiation Oncology | Admitting: Radiation Oncology

## 2021-06-19 ENCOUNTER — Inpatient Hospital Stay: Payer: Medicare Other

## 2021-06-19 ENCOUNTER — Ambulatory Visit: Payer: Medicare Other

## 2021-06-19 DIAGNOSIS — F17210 Nicotine dependence, cigarettes, uncomplicated: Secondary | ICD-10-CM | POA: Diagnosis not present

## 2021-06-19 DIAGNOSIS — C3411 Malignant neoplasm of upper lobe, right bronchus or lung: Secondary | ICD-10-CM | POA: Diagnosis not present

## 2021-06-19 DIAGNOSIS — I5042 Chronic combined systolic (congestive) and diastolic (congestive) heart failure: Secondary | ICD-10-CM | POA: Diagnosis not present

## 2021-06-19 DIAGNOSIS — E119 Type 2 diabetes mellitus without complications: Secondary | ICD-10-CM | POA: Diagnosis not present

## 2021-06-20 ENCOUNTER — Other Ambulatory Visit: Payer: Self-pay

## 2021-06-20 DIAGNOSIS — I5042 Chronic combined systolic (congestive) and diastolic (congestive) heart failure: Secondary | ICD-10-CM | POA: Diagnosis not present

## 2021-06-20 DIAGNOSIS — E119 Type 2 diabetes mellitus without complications: Secondary | ICD-10-CM | POA: Diagnosis not present

## 2021-06-20 DIAGNOSIS — C3411 Malignant neoplasm of upper lobe, right bronchus or lung: Secondary | ICD-10-CM | POA: Diagnosis not present

## 2021-06-20 LAB — GLUCOSE, CAPILLARY
Glucose-Capillary: 142 mg/dL — ABNORMAL HIGH (ref 70–99)
Glucose-Capillary: 149 mg/dL — ABNORMAL HIGH (ref 70–99)

## 2021-06-20 NOTE — Progress Notes (Signed)
Daily Session Note  Patient Details  Name: KAELAN AMBLE MRN: [id number] Date of Birth: 02/15/1931 Referring Provider:   Flowsheet Row Cardiac Rehab from 05/24/2021 in [facility] Cardiac and Pulmonary Rehab  Referring Provider Gollan       Encounter Date: 06/20/2021  Check In:  Session Check In - 06/20/21 1013       Check-In   Staff Present Birdie Sons, MPA, RN;Melissa Dixon, RDN, Tawanna Solo, MS, ASCM CEP, Exercise Physiologist;Nychelle Cassata, RN,BC,MSN;Joseph [place], [place]    Virtual Visit No    Medication changes reported     No    Fall or balance concerns reported    No    Warm-up and Cool-down Performed on first and last piece of equipment    Resistance Training Performed Yes    VAD Patient? No    PAD/SET Patient? No      Pain Assessment   Currently in Pain? No/denies                Social History   Tobacco Use  Smoking Status Former   Packs/day: 1.00   Years: 56.00   Pack years: 56.00   Types: Cigarettes   Quit date: 2011   Years since quitting: 11.4  Smokeless Tobacco Never    Goals Met:  Independence with exercise equipment Exercise tolerated well No report of cardiac concerns or symptoms Strength training completed today  Goals Unmet:  Not Applicable  Comments: First full day of exercise!  Patient was oriented to gym and equipment including functions, settings, policies, and procedures.  Patient's individual exercise prescription and treatment plan were reviewed.  All starting workloads were established based on the results of the 6 minute walk test done at initial orientation visit.  The plan for exercise progression was also introduced and progression will be customized based on patient's performance and goals.   Dr. Emily Filbert is Medical Director for [facility].  Dr. Ottie Glazier is Medical Director for [facility] Pulmonary Rehabilitation.

## 2021-06-21 ENCOUNTER — Ambulatory Visit: Payer: Medicare Other

## 2021-06-21 ENCOUNTER — Encounter: Payer: Medicare Other | Admitting: Speech Pathology

## 2021-06-22 ENCOUNTER — Encounter: Payer: Medicare Other | Attending: Cardiovascular Disease | Admitting: *Deleted

## 2021-06-22 ENCOUNTER — Ambulatory Visit: Payer: Self-pay | Admitting: *Deleted

## 2021-06-22 ENCOUNTER — Other Ambulatory Visit: Payer: Self-pay

## 2021-06-22 DIAGNOSIS — Z79899 Other long term (current) drug therapy: Secondary | ICD-10-CM | POA: Diagnosis not present

## 2021-06-22 DIAGNOSIS — I5042 Chronic combined systolic (congestive) and diastolic (congestive) heart failure: Secondary | ICD-10-CM | POA: Diagnosis not present

## 2021-06-22 LAB — GLUCOSE, CAPILLARY
Glucose-Capillary: 226 mg/dL — ABNORMAL HIGH (ref 70–99)
Glucose-Capillary: 135 mg/dL — ABNORMAL HIGH (ref 70–99)

## 2021-06-22 NOTE — Telephone Encounter (Signed)
Thanks. Agree with this plan.

## 2021-06-22 NOTE — Telephone Encounter (Signed)
Wife Glenden Rossell calling in.    He has stage III lung cancer.   Getting treatment.   He has stuff running out of his head.   He is having nasal congestion when I clarified what she was talking about Reason for Disposition  Patient sounds very sick or weak to the triager    Pt has post nasal drip and coughing.   Getting radiation for lung cancer.  No fever.   Not tested for Covid  Answer Assessment - Initial Assessment Questions 1. ONSET: "When did the nasal discharge start?"      A couple of weeks ago.    He has stage III lung cancer and is getting treated.   Wife calling in. 2. AMOUNT: "How much discharge is there?"      He is having a lot of post nasal drip and coughing when it gets in his throat. 3. COUGH: "Do you have a cough?" If yes, ask: "Describe the color of your sputum" (clear, white, yellow, green)     Yes productive with clear mucus. 4. RESPIRATORY DISTRESS: "Describe your breathing."      Has shortness of breath but it's not different than what he has been experiencing. 5. FEVER: "Do you have a fever?" If Yes, ask: "What is your temperature, how was it measured, and when did it start?"     No fever, body aches or chills 6. SEVERITY: "Overall, how bad are you feeling right now?" (e.g., doesn't interfere with normal activities, staying home from school/work, staying in bed)      His head feels strange.   He has started [facility] a rehab program at the hospital.    He has been going since last Wed. No Covid test done.    7. OTHER SYMPTOMS: "Do you have any other symptoms?" (e.g., sore throat, earache, wheezing, vomiting)     No diarrhea or vomiting.   He's has had 35 radiation treatments so his appetite is not good. 8. PREGNANCY: "Is there any chance you are pregnant?" "When was your last menstrual period?"     N/A  Protocols used: Common Cold-A-AH

## 2021-06-22 NOTE — Telephone Encounter (Signed)
Pt's wife called in.   Pt with her.    He has stage III lung cancer and is getting radiation treatments.  He's had 35 treatments so far.   A couple of weeks ago he developed a post nasal drip and runny nose that is causing him to cough a lot when it drains down his throat.   It's clear what he is coughing up.    No fever, body aches, chills, change in smell/taste, diarrhea, vomiting, breaking out in sweats.   He has not tested for Covid.   He goes to the rehab center and is supposed to go to day.    I let [name] know she should contact them and let them know he is having these symptoms to be sure they still want him to come in today.    She let me know she would call them.  There is only one provider at [facility] today and she is booked.    I have instructed her to take him to the urgent care or better yet to contact his oncologist.    Let him/her know what is going on with him to see if he is a candidate for antiviral therapy if he is positive for Covid since his provider is out of the office today.   They do not have any Covid test kits at home.    "I would have to go get some but I can do that".    She was agreeable to calling his oncologist and thanked me for my help.

## 2021-06-22 NOTE — Progress Notes (Signed)
Daily Session Note  Patient Details  Name: Howard [name] MRN: [id number] Date of Birth: 1931/08/04 Referring Provider:   Flowsheet Row Cardiac Rehab from 05/24/2021 in [facility] Cardiac and Pulmonary Rehab  Referring Provider Gollan       Encounter Date: 06/22/2021  Check In:  Session Check In - 06/22/21 0946       Check-In   Supervising physician immediately available to respond to emergencies See telemetry face sheet for immediately available ER MD    Location [facility]-Cardiac & Pulmonary Rehab    Staff Present Renita Papa, RN BSN;Joseph [place], RCP,RRT,BSRT;Jessica [place], [place], RCEP, CCRP, CCET    Virtual Visit No    Medication changes reported     No    Fall or balance concerns reported    No    Warm-up and Cool-down Performed on first and last piece of equipment    Resistance Training Performed Yes    VAD Patient? No    PAD/SET Patient? No      Pain Assessment   Currently in Pain? No/denies                Social History   Tobacco Use  Smoking Status Former   Packs/day: 1.00   Years: 56.00   Pack years: 56.00   Types: Cigarettes   Quit date: 2011   Years since quitting: 11.5  Smokeless Tobacco Never    Goals Met:  Independence with exercise equipment Exercise tolerated well No report of cardiac concerns or symptoms Strength training completed today  Goals Unmet:  Not Applicable  Comments: Pt able to follow exercise prescription today without complaint.  Will continue to monitor for progression.    Dr. Emily Filbert is Medical Director for [facility].  Dr. Ottie Glazier is Medical Director for [facility] Pulmonary Rehabilitation.

## 2021-06-27 ENCOUNTER — Other Ambulatory Visit: Payer: Self-pay

## 2021-06-27 DIAGNOSIS — I5042 Chronic combined systolic (congestive) and diastolic (congestive) heart failure: Secondary | ICD-10-CM

## 2021-06-27 DIAGNOSIS — Z79899 Other long term (current) drug therapy: Secondary | ICD-10-CM | POA: Diagnosis not present

## 2021-06-27 LAB — GLUCOSE, CAPILLARY
Glucose-Capillary: 173 mg/dL — ABNORMAL HIGH (ref 70–99)
Glucose-Capillary: 139 mg/dL — ABNORMAL HIGH (ref 70–99)

## 2021-06-27 NOTE — Progress Notes (Signed)
Daily Session Note  Patient Details  Name: Howard [name] MRN: [id number] Date of Birth: [birth year] Referring Provider:   Flowsheet Row Cardiac Rehab from 05/24/2021 in [facility] Cardiac and Pulmonary Rehab  Referring Provider Gollan       Encounter Date: 06/27/2021  Check In:  Session Check In - 06/27/21 0953       Check-In   Supervising physician immediately available to respond to emergencies See telemetry face sheet for immediately available ER MD    Location [facility]-Cardiac & Pulmonary Rehab    Staff Present Birdie Sons, MPA, RN;Laureen Owens Shark, BS, RRT, CPFT;Joseph Tessie [place], [place]    Virtual Visit No    Medication changes reported     No    Fall or balance concerns reported    No    Warm-up and Cool-down Performed on first and last piece of equipment    Resistance Training Performed Yes    VAD Patient? No    PAD/SET Patient? No      Pain Assessment   Currently in Pain? No/denies                Social History   Tobacco Use  Smoking Status Former   Packs/day: 1.00   Years: 56.00   Pack years: 56.00   Types: Cigarettes   Quit date: 2011   Years since quitting: 11.5  Smokeless Tobacco Never    Goals Met:  Independence with exercise equipment Exercise tolerated well No report of cardiac concerns or symptoms Strength training completed today  Goals Unmet:  Not Applicable  Comments: Pt able to follow exercise prescription today without complaint.  Will continue to monitor for progression.    Dr. Emily Filbert is Medical Director for [facility].  Dr. Ottie Glazier is Medical Director for [facility] Pulmonary Rehabilitation.

## 2021-07-01 ENCOUNTER — Other Ambulatory Visit: Payer: Self-pay

## 2021-07-01 ENCOUNTER — Ambulatory Visit
Admission: EM | Admit: 2021-07-01 | Discharge: 2021-07-01 | Disposition: A | Discharge status: Home / Self Care | Payer: Medicare Other | Attending: Physician Assistant | Admitting: Physician Assistant

## 2021-07-01 ENCOUNTER — Ambulatory Visit (INDEPENDENT_AMBULATORY_CARE_PROVIDER_SITE_OTHER): Payer: Medicare Other

## 2021-07-01 ENCOUNTER — Encounter: Payer: Self-pay | Admitting: Emergency Medicine

## 2021-07-01 DIAGNOSIS — R0981 Nasal congestion: Secondary | ICD-10-CM | POA: Diagnosis not present

## 2021-07-01 DIAGNOSIS — Z7982 Long term (current) use of aspirin: Secondary | ICD-10-CM | POA: Diagnosis not present

## 2021-07-01 DIAGNOSIS — J019 Acute sinusitis, unspecified: Secondary | ICD-10-CM | POA: Diagnosis not present

## 2021-07-01 DIAGNOSIS — Z85110 Personal history of malignant carcinoid tumor of bronchus and lung: Secondary | ICD-10-CM | POA: Diagnosis not present

## 2021-07-01 DIAGNOSIS — Z79899 Other long term (current) drug therapy: Secondary | ICD-10-CM | POA: Insufficient documentation

## 2021-07-01 DIAGNOSIS — Z7984 Long term (current) use of oral hypoglycemic drugs: Secondary | ICD-10-CM | POA: Diagnosis not present

## 2021-07-01 DIAGNOSIS — Z20822 Contact with and (suspected) exposure to covid-19: Secondary | ICD-10-CM | POA: Diagnosis not present

## 2021-07-01 DIAGNOSIS — R059 Cough, unspecified: Secondary | ICD-10-CM | POA: Insufficient documentation

## 2021-07-01 DIAGNOSIS — I5042 Chronic combined systolic (congestive) and diastolic (congestive) heart failure: Secondary | ICD-10-CM | POA: Insufficient documentation

## 2021-07-01 DIAGNOSIS — J441 Chronic obstructive pulmonary disease with (acute) exacerbation: Secondary | ICD-10-CM | POA: Diagnosis not present

## 2021-07-01 DIAGNOSIS — Z87891 Personal history of nicotine dependence: Secondary | ICD-10-CM | POA: Insufficient documentation

## 2021-07-01 DIAGNOSIS — R0982 Postnasal drip: Secondary | ICD-10-CM | POA: Insufficient documentation

## 2021-07-01 DIAGNOSIS — Z7901 Long term (current) use of anticoagulants: Secondary | ICD-10-CM | POA: Insufficient documentation

## 2021-07-01 DIAGNOSIS — Z950 Presence of cardiac pacemaker: Secondary | ICD-10-CM | POA: Diagnosis not present

## 2021-07-01 DIAGNOSIS — Z882 Allergy status to sulfonamides status: Secondary | ICD-10-CM | POA: Insufficient documentation

## 2021-07-01 DIAGNOSIS — E1151 Type 2 diabetes mellitus with diabetic peripheral angiopathy without gangrene: Secondary | ICD-10-CM | POA: Diagnosis not present

## 2021-07-01 DIAGNOSIS — R509 Fever, unspecified: Secondary | ICD-10-CM | POA: Insufficient documentation

## 2021-07-01 DIAGNOSIS — E785 Hyperlipidemia, unspecified: Secondary | ICD-10-CM | POA: Insufficient documentation

## 2021-07-01 DIAGNOSIS — I110 Hypertensive heart disease with heart failure: Secondary | ICD-10-CM | POA: Diagnosis not present

## 2021-07-01 LAB — INFLUENZA A AND B ANTIGEN (CONVERTED LAB)
INFLUENZA A ANTIGEN, POC: NEGATIVE
INFLUENZA B ANTIGEN, POC: NEGATIVE

## 2021-07-01 MED ORDER — PROMETHAZINE-DM 6.25-15 MG/5ML PO SYRP: 5.0000 mL | ORAL_SOLUTION | Freq: Three times a day (TID) | ORAL | 0 refills | Status: DC | PRN

## 2021-07-01 MED ORDER — AMOXICILLIN 500 MG PO CAPS: 1000.0000 mg | ORAL_CAPSULE | Freq: Three times a day (TID) | ORAL | 0 refills | Status: AC

## 2021-07-01 NOTE — ED Triage Notes (Signed)
Patient c/o cough and chest congestion and runny nose that started several months ago.  Patient states his cough has gotten worse at night.  Patient receiving radiation for lung cancer.

## 2021-07-01 NOTE — Discharge Instructions (Addendum)
Your flu test was negative today.  The COVID test will be back tomorrow and someone will contact you if positive.  We can then speak to you about possible antiviral or antibody therapy and determine what would be best for you.  Since you do have a low-grade fever and congestion symptoms of postnasal drainage for the past 3 weeks, it is possible that he could have a sinus infection so I have sent in an antibiotic.  You also may be having an exacerbation of your COPD which is treated with the same medication often.  Continue your inhalers and increasing rest and fluids.  Go to the ED for any severe acute worsening of your symptoms such as fever that you cannot control, severe worsening of cough or increasing breathing difficulty.  You have received COVID testing today either for positive exposure, concerning symptoms that could be related to COVID infection, screening purposes, or re-testing after confirmed positive.  Your test obtained today checks for active viral infection in the last 1-2 weeks. If your test is negative now, you can still test positive later. So, if you do develop symptoms you should either get re-tested and/or isolate x 5 days and then strict mask use x 5 days (unvaccinated) or mask use x 10 days (vaccinated). Please follow CDC guidelines.  While Rapid antigen tests come back in 15-20 minutes, send out PCR/molecular test results typically come back within 1-3 days. In the mean time, if you are symptomatic, assume this could be a positive test and treat/monitor yourself as if you do have COVID.   We will call with test results if positive. Please download the MyChart app and set up a profile to access test results.   If symptomatic, go home and rest. Push fluids. Take Tylenol as needed for discomfort. Gargle warm salt water. Throat lozenges. Take Mucinex DM or Robitussin for cough. Humidifier in bedroom to ease coughing. Warm showers. Also review the COVID handout for more  information.  COVID-19 INFECTION: The incubation period of COVID-19 is approximately 14 days after exposure, with most symptoms developing in roughly 4-5 days. Symptoms may range in severity from mild to critically severe. Roughly 80% of those infected will have mild symptoms. People of any age may become infected with COVID-19 and have the ability to transmit the virus. The most common symptoms include: fever, fatigue, cough, body aches, headaches, sore throat, nasal congestion, shortness of breath, nausea, vomiting, diarrhea, changes in smell and/or taste.    COURSE OF ILLNESS Some patients may begin with mild disease which can progress quickly into critical symptoms. If your symptoms are worsening please call ahead to the Emergency Department and proceed there for further treatment. Recovery time appears to be roughly 1-2 weeks for mild symptoms and 3-6 weeks for severe disease.   GO IMMEDIATELY TO ER FOR FEVER YOU ARE UNABLE TO GET DOWN WITH TYLENOL, BREATHING PROBLEMS, CHEST PAIN, FATIGUE, LETHARGY, INABILITY TO EAT OR DRINK, ETC  QUARANTINE AND ISOLATION: To help decrease the spread of COVID-19 please remain isolated if you have COVID infection or are highly suspected to have COVID infection. This means -stay home and isolate to one room in the home if you live with others. Do not share a bed or bathroom with others while ill, sanitize and wipe down all countertops and keep common areas clean and disinfected. Stay home for 5 days. If you have no symptoms or your symptoms are resolving after 5 days, you can leave your house. Continue to wear  a mask around others for 5 additional days. If you have been in close contact (within 6 feet) of someone diagnosed with COVID 19, you are advised to quarantine in your home for 14 days as symptoms can develop anywhere from 2-14 days after exposure to the virus. If you develop symptoms, you  must isolate.  Most current guidelines for COVID after  exposure -unvaccinated: isolate 5 days and strict mask use x 5 days. Test on day 5 is possible -vaccinated: wear mask x 10 days if symptoms do not develop -You do not necessarily need to be tested for COVID if you have + exposure and  develop symptoms. Just isolate at home x10 days from symptom onset During this global pandemic, CDC advises to practice social distancing, try to stay at least 3ft away from others at all times. Wear a face covering. Wash and sanitize your hands regularly and avoid going anywhere that is not necessary.  KEEP IN MIND THAT THE COVID TEST IS NOT 100% ACCURATE AND YOU SHOULD STILL DO EVERYTHING TO PREVENT POTENTIAL SPREAD OF VIRUS TO OTHERS (WEAR MASK, WEAR GLOVES, [place] HANDS AND SANITIZE REGULARLY). IF INITIAL TEST IS NEGATIVE, THIS MAY NOT MEAN YOU ARE DEFINITELY NEGATIVE. MOST ACCURATE TESTING IS DONE 5-7 DAYS AFTER EXPOSURE.   It is not advised by CDC to get re-tested after receiving a positive COVID test since you can still test positive for weeks to months after you have already cleared the virus.   *If you have not been vaccinated for COVID, I strongly suggest you consider getting vaccinated as long as there are no contraindications.

## 2021-07-01 NOTE — ED Provider Notes (Signed)
[facility]    CSN: [id number] Arrival date & time: 07/01/21  1027      History   Chief Complaint Chief Complaint  Patient presents with   Cough    HPI Howard [name] is a 87 y.o. male with complex medical history including stage III adenocarcinoma the right lung (undergoing radiation therapy), COPD, combined systolic and diastolic CHF, hypertension, hyperlipidemia, diabetes, obstructive sleep apnea, and presence of pacemaker.  Patient is also currently in cardiac rehab.  Patient presents with his wife today for concerns about chills and fever up to 101 degrees over the past 2 days.  He has taken Tylenol for the fever but has not taken anything today and his temperature is currently 100 degrees.  He has also had increased nasal congestion, postnasal drainage and productive cough over the past 3 weeks.  Patient was advised that it is normal to have a cough with radiation treatment.  However, they became concerned when he developed a fever.  He also admits to fatigue but says that it is no different than his normal fatigue that he has.  He denies any increasing breathing difficulty.  His wife also says that he has been breathing fine.  Oxygen is currently 96%.  He is not on supplemental oxygen.  He denies any chest pain or wheezing.  He is not currently taking any cough syrup and does continue to use his inhalers for COPD.  Patient denies any COVID exposure and has been vaccinated for COVID-19 x3.  Also denies any influenza exposure and has had influenza vaccine and also pneumococcal vaccinations.  They have no other complaints or concerns today.  HPI  Past Medical History:  Diagnosis Date   Arthritis    Bell palsy    Bell's palsy 04/12/2015   Cancer [facility])    prostate and skin   Chronic combined systolic and diastolic CHF, NYHA class 1 ([place])    a. 07/2014 Echo: EF 35-40%, Gr 1 DD.   Complete heart block ([place])    a. 11/2010 s/p SJM 2210 [employer] DC PPM, ser# [id number].    Depression    Diabetes mellitus without complication ([place])    Fall 11-10-14   GERD (gastroesophageal reflux disease)    History of prostate cancer    Hyperlipidemia    Hypertension    LBBB (left bundle branch block)    Left-sided Bell's palsy    Lung cancer ([place]) 2016   NICM (nonischemic cardiomyopathy) ([place])    a. 07/2014 Echo: EF 35-40%, mid-apicalanteroseptal DK, Gr 1 DD, mild-mod dil LA.   Non-obstructive CAD    a. 07/2014 Abnl MV;  b. 08/2014 Cath: LM nl, LAD 30p, RI 40p, LCX nl, OM1 40, RCA dominant 30p, 70d-->Med Rx.   Poor balance    Presence of permanent cardiac pacemaker    Sleep apnea    a. cpap   Vertigo    WPW (Wolff-Parkinson-White syndrome)    a. S/P RFCA 1991.    Patient Active Problem List   Diagnosis Date Noted   Goals of care, counseling/discussion 04/16/2021   Mediastinal lymphadenopathy 04/16/2021   Heart failure with reduced ejection fraction ([place])    AAA (abdominal aortic aneurysm) without rupture ([place]) 12/10/2019   Renal artery stenosis ([place]) 12/10/2019   PAD (peripheral artery disease) ([place]) 12/10/2019   Thromboembolism ([place]) 08/30/2019   Atherosclerosis of native arteries of extremity with intermittent claudication ([place]) 08/23/2019   Swelling of limb 08/17/2019   Pain in limb 08/17/2019   NICM (nonischemic  cardiomyopathy) ([place]) 07/01/2019   CHF (congestive heart failure) ([place]) 07/01/2019   Malignant neoplasm of right lung ([place]) 04/18/2016   CAD (coronary artery disease) 04/01/2016   Adenocarcinoma ([place]) 04/01/2016   Skin cyst 12/26/2015   GERD (gastroesophageal reflux disease) 06/16/2015   Abscess of back 06/06/2015   Vertigo 03/27/2015   Carpal tunnel syndrome 10/28/2014   Status post cholecystectomy 09/29/2014   Disease of digestive tract 09/29/2014   Cardiomyopathy ([place]) 09/26/2014   Type 2 diabetes mellitus without complications ([place])    Sleep apnea    Spinal stenosis, lumbar region, with neurogenic claudication 06/07/2014   Lumbar  stenosis with neurogenic claudication 06/07/2014   Abnormal gait 08/20/2012   H/O total knee replacement 08/20/2012   Arthritis of knee, degenerative 08/20/2012   Pacemaker-St.Jude 08/03/2012   Cardiac conduction disorder 06/19/2012   Acid reflux 06/18/2012   Nodal rhythm disorder 06/18/2012   Triggering of digit 03/25/2012   Hyperlipidemia 12/23/2011   Essential hypertension 03/23/2011   Atrioventricular block, complete ([place]) 03/23/2011   Complete atrioventricular block ([place]) 03/23/2011    Past Surgical History:  Procedure Laterality Date   ABDOMINAL AORTIC ENDOVASCULAR STENT GRAFT  08/25/2019   Procedure: ABDOMINAL AORTIC ENDOVASCULAR STENT GRAFT;  Surgeon: Algernon Huxley, MD;  Location: [facility] ORS;  Service: Vascular;;   ANGIOPLASTY Left 08/25/2019   Procedure: ANGIOPLASTY;  Surgeon: Algernon Huxley, MD;  Location: [facility] ORS;  Service: Vascular;  Laterality: Left;  left SFA and stent placement   APPLICATION OF WOUND VAC Left 06/07/2015   Procedure: APPLICATION OF WOUND VAC;  Surgeon: Robert Bellow, MD;  Location: [facility] ORS;  Service: General;  Laterality: Left;  left upper back   [place]  08/26/2014   Single vessel obstructive CAD   CARPAL TUNNEL RELEASE  04-04-15   [facility]   CATARACT EXTRACTION  07-31-11 and 09-18-11   Catheter ablation  1991   for WPW   cervical fusion     CHOLECYSTECTOMY  09-07-14   ENDARTERECTOMY FEMORAL Left 08/25/2019   Procedure: ENDARTERECTOMY FEMORAL;  Surgeon: Algernon Huxley, MD;  Location: [facility] ORS;  Service: Vascular;  Laterality: Left;  common and produndis    ENDOVASCULAR REPAIR/STENT GRAFT Right 08/25/2019   Procedure: ENDOVASCULAR REPAIR/STENT GRAFT;  Surgeon: Algernon Huxley, MD;  Location: [facility] ORS;  Service: Vascular;  Laterality: Right;  renal artery   HAND SURGERY     right 1993; left 2005   [place] / REPLACE / REMOVE PACEMAKER     INSERTION OF ILIAC STENT Bilateral 08/25/2019   Procedure: INSERTION OF  ILIAC STENT;  Surgeon: Algernon Huxley, MD;  Location: [facility] ORS;  Service: Vascular;  Laterality: Bilateral;   JOINT REPLACEMENT Left 2013   knee   JOINT REPLACEMENT Right 2004   knee   KNEE SURGERY     left knee 1991 and 1992; right knee [place] N/A 08/26/2014   Procedure: LEFT HEART CATHETERIZATION WITH CORONARY ANGIOGRAM;  Surgeon: [name], MD;  Location[facility];  Service: Cardiovascular;  Laterality: N/A;   LOWER EXTREMITY ANGIOGRAPHY Left 08/23/2019   Procedure: Lower Extremity Angiography;  Surgeon: Algernon Huxley, MD;  Location: [place] CV LAB;  Service: Cardiovascular;  Laterality: Left;   LUMBAR LAMINECTOMY/DECOMPRESSION MICRODISCECTOMY N/A 06/07/2014   Procedure: LUMBAR FOUR TO FIVE LUMBAR LAMINECTOMY/DECOMPRESSION MICRODISCECTOMY 1 LEVEL;  Surgeon: [name], MD;  Location: [facility]  NEURO ORS;  Service: Neurosurgery;  Laterality: N/A;   LUNG BIOPSY Right 2016   Dr [name]   MOHS SURGERY     PACEMAKER INSERTION     PPM-- St Jude 11/30/10 by [name]   PPM GENERATOR CHANGEOUT N/A 07/09/2019   Procedure: PPM GENERATOR CHANGEOUT;  Surgeon: Deboraha Sprang, MD;  Location: [place] CV LAB;  Service: Cardiovascular;  Laterality: N/A;   PROSTATE SURGERY     cancer--1998, prostatectomy   REPLACEMENT TOTAL KNEE     2004   RIGHT/LEFT HEART CATH AND CORONARY ANGIOGRAPHY Bilateral 04/13/2021   Procedure: RIGHT/LEFT HEART CATH AND CORONARY ANGIOGRAPHY;  Surgeon: Wellington Hampshire, MD;  Location: [place] CV LAB;  Service: Cardiovascular;  Laterality: Bilateral;   ruptured disc     1962 and 1998   TEE WITHOUT CARDIOVERSION N/A 09/01/2019   Procedure: TRANSESOPHAGEAL ECHOCARDIOGRAM (TEE);  Surgeon: [name], MD;  Location: [facility] ORS;  Service: Cardiovascular;  Laterality: N/A;   TEMPORARY PACEMAKER N/A 07/09/2019   Procedure: TEMPORARY PACEMAKER;  Surgeon: Deboraha Sprang, MD;  Location: [place] CV LAB;  Service: Cardiovascular;   Laterality: N/A;   TRIGGER FINGER RELEASE  01-24-15   WOUND DEBRIDEMENT Left 06/07/2015   Procedure: DEBRIDEMENT WOUND;  Surgeon: Robert Bellow, MD;  Location: [facility] ORS;  Service: General;  Laterality: Left;  left upper back       Home Medications    Prior to Admission medications   Medication Sig Start Date End Date Taking? Authorizing Provider  amoxicillin (AMOXIL) 500 MG capsule Take 2 capsules (1,000 mg total) by mouth 3 (three) times daily for 7 days. 07/01/21 07/08/21 Yes Danton Clap, PA-C  promethazine-dextromethorphan (PROMETHAZINE-DM) 6.25-15 MG/5ML syrup Take 5 mLs by mouth 3 (three) times daily as needed for cough. 07/01/21  Yes Danton Clap, PA-C  acetaminophen (TYLENOL) 500 MG tablet Take 1,000 mg by mouth every 6 (six) hours as needed for moderate pain.    [provider]  albuterol (VENTOLIN HFA) 108 (90 Base) MCG/ACT inhaler Inhale 2 puffs into the lungs every 6 (six) hours as needed for wheezing or shortness of breath. 12/19/20   [name], MD  aspirin EC 81 MG tablet Take 81 mg by mouth daily.    [provider]  cetirizine (ZYRTEC) 10 MG tablet Take 10 mg by mouth daily.    [provider]  Cholecalciferol (VITAMIN D3) 1000 UNITS CAPS Take 1,000 Units by mouth daily.    [provider]  donepezil (ARICEPT) 5 MG tablet Take 5 mg by mouth daily. 03/08/21   [provider]  ELIQUIS 5 MG TABS tablet TAKE 1 TABLET TWICE A DAY  (SWITCHED FROM PLAVIX) 08/25/20   Algernon Huxley, MD  ezetimibe (ZETIA) 10 MG tablet TAKE 1 TABLET DAILY 02/12/21   [name], MD  Fluticasone-Umeclidin-Vilant (TRELEGY ELLIPTA) 100-62.5-25 MCG/INH AEPB Inhale 1 puff into the lungs daily. 02/14/21   Tyler Pita, MD  furosemide (LASIX) 20 MG tablet Take 1 tablet (20 mg total) by mouth daily. 04/09/21 10/06/21  Loel Dubonnet, NP  Iron-Vitamin C (VITRON-C) 65-125 MG TABS Take 1 tablet by mouth daily. 04/16/21   Earlie Server, MD  isosorbide  mononitrate (IMDUR) 30 MG 24 hr tablet TAKE 1 TABLET DAILY 02/12/21   [name], MD  metFORMIN (GLUCOPHAGE) 500 MG tablet Take 1 tablet (500 mg total) by mouth 2 (two) times daily with a meal. 02/16/21   Jerrol Banana., MD  metoprolol succinate (TOPROL-XL) 25 MG 24 hr tablet TAKE 1 TABLET AT BEDTIME 02/12/21   [name], MD  montelukast (SINGULAIR) 10 MG tablet Take 10 mg by mouth daily as needed (sneezing/allergies.).    [provider]  Multiple Vitamin (MULTIVITAMIN WITH MINERALS) TABS tablet Take 1 tablet by mouth daily. One-A-Day Multivitamin    [provider]  omeprazole (PRILOSEC) 40 MG capsule TAKE 1 CAPSULE TWICE DAILY AS NEEDED 02/12/21   Chrismon, [name], PA-C  sacubitril-valsartan (ENTRESTO) 49-51 MG Take 1 tablet by mouth 2 (two) times daily. 06/04/21 10/02/21  Marrianne Mood D, PA-C  simvastatin (ZOCOR) 40 MG tablet TAKE 1 TABLET AT BEDTIME 03/23/21   [name], MD  spironolactone (ALDACTONE) 25 MG tablet Take 1 tablet (25 mg total) by mouth daily. 04/23/21   Arvil Chaco, PA-C    Family History Family History  Problem Relation Age of Onset   Heart attack Mother    Hyperlipidemia Mother    CAD Other    Prostate cancer Neg Hx    Cancer Neg Hx     Social History Social History   Tobacco Use   Smoking status: Former    Packs/day: 1.00    Years: 56.00    Pack years: 56.00    Types: Cigarettes    Quit date: 2011    Years since quitting: 11.5   Smokeless tobacco: Never  Vaping Use   Vaping Use: Never used  Substance Use Topics   Alcohol use: No   Drug use: No     Allergies   Sulfa antibiotics   Review of Systems Review of Systems  Constitutional:  Positive for chills and fatigue. Negative for fever.  HENT:  Positive for congestion, postnasal drip, rhinorrhea, sinus pressure and sore throat.   Respiratory:  Positive for cough. Negative for shortness of breath and wheezing.   Cardiovascular:  Negative for  chest pain.  Gastrointestinal:  Negative for abdominal pain, diarrhea, nausea and vomiting.  Musculoskeletal:  Negative for myalgias.  Neurological:  Negative for weakness, light-headedness and headaches.  Hematological:  Negative for adenopathy.    Physical Exam Triage Vital Signs ED Triage Vitals  Enc Vitals Group     BP 07/01/21 1052 133/66     Pulse Rate 07/01/21 1052 96     Resp 07/01/21 1052 16     Temp 07/01/21 1052 100 F (37.8 C)     Temp Source 07/01/21 1052 Oral     SpO2 07/01/21 1052 96 %     Weight 07/01/21 1048 185 lb (83.9 kg)     Height 07/01/21 1048 5\' 9"  (1.753 m)     Head Circumference --      Peak Flow --      Pain Score 07/01/21 1048 0     Pain Loc --      Pain Edu? --      Excl. in [place]? --    No data found.  Updated Vital Signs BP 133/66 (BP Location: Left Arm)   Pulse 96   Temp 100 F (37.8 C) (Oral)   Resp 16   Ht 5\' 9"  (1.753 m)   Wt 185 lb (83.9 kg)   SpO2 96%   BMI 27.32 kg/m       Physical Exam Vitals and nursing note reviewed.  Constitutional:      General: He is not in acute distress.    Appearance: Normal appearance. He is well-developed. He is not ill-appearing or diaphoretic.  HENT:  Head: Normocephalic and atraumatic.     Right Ear: Tympanic membrane, ear canal and external ear normal.     Left Ear: Tympanic membrane, ear canal and external ear normal.     Nose: Congestion and rhinorrhea present.     Mouth/Throat:     Mouth: Mucous membranes are moist.     Pharynx: Oropharynx is clear. Uvula midline. Posterior oropharyngeal erythema present. No oropharyngeal exudate.     Tonsils: No tonsillar abscesses.  Eyes:     General: No scleral icterus.       Right eye: No discharge.        Left eye: No discharge.     Conjunctiva/sclera: Conjunctivae normal.  Neck:     Thyroid: No thyromegaly.     Trachea: No tracheal deviation.  Cardiovascular:     Rate and Rhythm: Normal rate and regular rhythm.     Heart sounds: Normal  heart sounds.  Pulmonary:     Effort: Pulmonary effort is normal. No respiratory distress.     Breath sounds: Normal breath sounds. No wheezing, rhonchi or rales.  Musculoskeletal:     Cervical back: Normal range of motion and neck supple.  Lymphadenopathy:     Cervical: No cervical adenopathy.  Skin:    General: Skin is warm and dry.     Findings: No rash.  Neurological:     General: No focal deficit present.     Mental Status: He is alert. Mental status is at baseline.     Motor: No weakness.  Psychiatric:        Mood and Affect: Mood normal.        Thought Content: Thought content normal.     UC Treatments / Results  Labs (all labs ordered are listed, but only abnormal results are displayed) Labs Reviewed  SARS CORONAVIRUS 2 (TAT 6-24 HRS)  INFLUENZA A AND B ANTIGEN (CONVERTED LAB)  POC INFLUENZA A AND B ANTIGEN (URGENT CARE ONLY)    EKG   Radiology DG Chest 2 View  Result Date: 07/01/2021 CLINICAL DATA:  Fever and cough.  History of lung cancer. EXAM: CHEST - 2 VIEW COMPARISON:  Chest x-ray dated 02/23/2021. FINDINGS: Heart size and mediastinal contours are stable. LEFT chest wall pacemaker/ICD apparatus appears stable. Lungs are clear. No pleural effusion or pneumothorax is seen. No acute-appearing osseous abnormality. IMPRESSION: No active cardiopulmonary disease. No evidence of pneumonia or pulmonary edema. Electronically Signed   By: Franki Cabot M.D.   On: 07/01/2021 11:33    Procedures Procedures (including critical care time)  Medications Ordered in UC Medications - No data to display  Initial Impression / Assessment and Plan / UC Course  I have reviewed the triage vital signs and the nursing notes.  Pertinent labs & imaging results that were available during my care of the patient were reviewed by me and considered in my medical decision making (see chart for details).  87 year old male presenting with wife today for fever up to 101 degrees and chills  over the past 2 days.  He is also had cough and nasal congestion as well as sore throat and fatigue.  Patient does have stage III adenocarcinoma of the lung and COPD.  His vitals are stable today.  Temperature is 100 degrees and oxygen is 96%.  He is in no acute distress and appears well overall.  He does have some minor congestion on exam and mild posterior pharyngeal erythema.  Chest is clear to auscultation.  Rapid influenza test is  negative.  COVID test obtained.  Current CDC guidelines, isolation protocol and ED precautions reviewed with patient and wife.  Chest x-ray obtained today which was independently viewed by me.  Negative chest x-ray.  Treating patient at this time for suspected sinusitis given his low-grade temperature and 3-week history of sinus symptoms.  I have sent in amoxicillin.  This will also cover if he is having an exacerbation of COPD which is possible.  Encouraged him to increase rest and fluids.  Sent in Promethazine DM and advised him to try this tonight but to call to verify it is okay with his doctors tomorrow.  Use inhalers as prescribed.  ED precautions reviewed with patient and his wife.  Advised if COVID test is positive we will call and see if he is a candidate for any of the antiviral or antibody therapies.   Final Clinical Impressions(s) / UC Diagnoses   Final diagnoses:  Acute sinusitis, recurrence not specified, unspecified location  Nasal congestion  Fever, unspecified  Chronic obstructive pulmonary disease with acute exacerbation [facility])     Discharge Instructions      Your flu test was negative today.  The COVID test will be back tomorrow and someone will contact you if positive.  We can then speak to you about possible antiviral or antibody therapy and determine what would be best for you.  Since you do have a low-grade fever and congestion symptoms of postnasal drainage for the past 3 weeks, it is possible that he could have a sinus infection so I  have sent in an antibiotic.  You also may be having an exacerbation of your COPD which is treated with the same medication often.  Continue your inhalers and increasing rest and fluids.  Go to the ED for any severe acute worsening of your symptoms such as fever that you cannot control, severe worsening of cough or increasing breathing difficulty.  You have received COVID testing today either for positive exposure, concerning symptoms that could be related to COVID infection, screening purposes, or re-testing after confirmed positive.  Your test obtained today checks for active viral infection in the last 1-2 weeks. If your test is negative now, you can still test positive later. So, if you do develop symptoms you should either get re-tested and/or isolate x 5 days and then strict mask use x 5 days (unvaccinated) or mask use x 10 days (vaccinated). Please follow CDC guidelines.  While Rapid antigen tests come back in 15-20 minutes, send out PCR/molecular test results typically come back within 1-3 days. In the mean time, if you are symptomatic, assume this could be a positive test and treat/monitor yourself as if you do have COVID.   We will call with test results if positive. Please download the MyChart app and set up a profile to access test results.   If symptomatic, go home and rest. Push fluids. Take Tylenol as needed for discomfort. Gargle warm salt water. Throat lozenges. Take Mucinex DM or Robitussin for cough. Humidifier in bedroom to ease coughing. Warm showers. Also review the COVID handout for more information.  COVID-19 INFECTION: The incubation period of COVID-19 is approximately 14 days after exposure, with most symptoms developing in roughly 4-5 days. Symptoms may range in severity from mild to critically severe. Roughly 80% of those infected will have mild symptoms. People of any age may become infected with COVID-19 and have the ability to transmit the virus. The most common symptoms  include: fever, fatigue, cough, body aches,  headaches, sore throat, nasal congestion, shortness of breath, nausea, vomiting, diarrhea, changes in smell and/or taste.    COURSE OF ILLNESS Some patients may begin with mild disease which can progress quickly into critical symptoms. If your symptoms are worsening please call ahead to the Emergency Department and proceed there for further treatment. Recovery time appears to be roughly 1-2 weeks for mild symptoms and 3-6 weeks for severe disease.   GO IMMEDIATELY TO ER FOR FEVER YOU ARE UNABLE TO GET DOWN WITH TYLENOL, BREATHING PROBLEMS, CHEST PAIN, FATIGUE, LETHARGY, INABILITY TO EAT OR DRINK, ETC  QUARANTINE AND ISOLATION: To help decrease the spread of COVID-19 please remain isolated if you have COVID infection or are highly suspected to have COVID infection. This means -stay home and isolate to one room in the home if you live with others. Do not share a bed or bathroom with others while ill, sanitize and wipe down all countertops and keep common areas clean and disinfected. Stay home for 5 days. If you have no symptoms or your symptoms are resolving after 5 days, you can leave your house. Continue to wear a mask around others for 5 additional days. If you have been in close contact (within 6 feet) of someone diagnosed with COVID 19, you are advised to quarantine in your home for 14 days as symptoms can develop anywhere from 2-14 days after exposure to the virus. If you develop symptoms, you  must isolate.  Most current guidelines for COVID after exposure -unvaccinated: isolate 5 days and strict mask use x 5 days. Test on day 5 is possible -vaccinated: wear mask x 10 days if symptoms do not develop -You do not necessarily need to be tested for COVID if you have + exposure and  develop symptoms. Just isolate at home x10 days from symptom onset During this global pandemic, CDC advises to practice social distancing, try to stay at least 73ft away from others  at all times. Wear a face covering. Wash and sanitize your hands regularly and avoid going anywhere that is not necessary.  KEEP IN MIND THAT THE COVID TEST IS NOT 100% ACCURATE AND YOU SHOULD STILL DO EVERYTHING TO PREVENT POTENTIAL SPREAD OF VIRUS TO OTHERS (WEAR MASK, WEAR GLOVES, [place] HANDS AND SANITIZE REGULARLY). IF INITIAL TEST IS NEGATIVE, THIS MAY NOT MEAN YOU ARE DEFINITELY NEGATIVE. MOST ACCURATE TESTING IS DONE 5-7 DAYS AFTER EXPOSURE.   It is not advised by CDC to get re-tested after receiving a positive COVID test since you can still test positive for weeks to months after you have already cleared the virus.   *If you have not been vaccinated for COVID, I strongly suggest you consider getting vaccinated as long as there are no contraindications.       ED Prescriptions     Medication Sig Dispense Auth. Provider   amoxicillin (AMOXIL) 500 MG capsule Take 2 capsules (1,000 mg total) by mouth 3 (three) times daily for 7 days. 42 capsule Danton Clap, PA-C   promethazine-dextromethorphan (PROMETHAZINE-DM) 6.25-15 MG/5ML syrup Take 5 mLs by mouth 3 (three) times daily as needed for cough. 118 mL Danton Clap, PA-C      PDMP not reviewed this encounter.   Danton Clap, PA-C 07/01/21 1244

## 2021-07-02 LAB — SARS CORONAVIRUS 2 (TAT 6-24 HRS): SARS Coronavirus 2: NEGATIVE

## 2021-07-03 ENCOUNTER — Other Ambulatory Visit: Payer: Self-pay

## 2021-07-03 ENCOUNTER — Inpatient Hospital Stay: Payer: Medicare Other

## 2021-07-03 ENCOUNTER — Ambulatory Visit
Admission: RE | Admit: 2021-07-03 | Discharge: 2021-07-03 | Disposition: A | Discharge status: Home / Self Care | Payer: Medicare Other | Source: Ambulatory Visit | Attending: Hospice and Palliative Medicine | Admitting: Hospice and Palliative Medicine

## 2021-07-03 ENCOUNTER — Telehealth: Payer: Self-pay | Admitting: *Deleted

## 2021-07-03 ENCOUNTER — Inpatient Hospital Stay: Payer: Medicare Other | Attending: Hospice and Palliative Medicine | Admitting: Hospice and Palliative Medicine

## 2021-07-03 ENCOUNTER — Encounter: Payer: Self-pay | Admitting: Hospice and Palliative Medicine

## 2021-07-03 ENCOUNTER — Ambulatory Visit
Admission: RE | Admit: 2021-07-03 | Discharge: 2021-07-03 | Disposition: A | Discharge status: Home / Self Care | Payer: Medicare Other | Attending: Oncology | Admitting: Oncology

## 2021-07-03 ENCOUNTER — Telehealth: Payer: Self-pay

## 2021-07-03 VITALS — BP 128/71 | HR 80 | Temp 97.6°F | Resp 18 | Wt 182.1 lb

## 2021-07-03 DIAGNOSIS — Z9049 Acquired absence of other specified parts of digestive tract: Secondary | ICD-10-CM | POA: Diagnosis not present

## 2021-07-03 DIAGNOSIS — Z8546 Personal history of malignant neoplasm of prostate: Secondary | ICD-10-CM | POA: Diagnosis not present

## 2021-07-03 DIAGNOSIS — R0982 Postnasal drip: Secondary | ICD-10-CM | POA: Diagnosis not present

## 2021-07-03 DIAGNOSIS — J329 Chronic sinusitis, unspecified: Secondary | ICD-10-CM | POA: Diagnosis not present

## 2021-07-03 DIAGNOSIS — R6881 Early satiety: Secondary | ICD-10-CM | POA: Diagnosis not present

## 2021-07-03 DIAGNOSIS — R634 Abnormal weight loss: Secondary | ICD-10-CM | POA: Insufficient documentation

## 2021-07-03 DIAGNOSIS — R918 Other nonspecific abnormal finding of lung field: Secondary | ICD-10-CM

## 2021-07-03 DIAGNOSIS — J449 Chronic obstructive pulmonary disease, unspecified: Secondary | ICD-10-CM | POA: Insufficient documentation

## 2021-07-03 DIAGNOSIS — C3490 Malignant neoplasm of unspecified part of unspecified bronchus or lung: Secondary | ICD-10-CM

## 2021-07-03 DIAGNOSIS — I428 Other cardiomyopathies: Secondary | ICD-10-CM | POA: Insufficient documentation

## 2021-07-03 DIAGNOSIS — Z882 Allergy status to sulfonamides status: Secondary | ICD-10-CM | POA: Insufficient documentation

## 2021-07-03 DIAGNOSIS — I5022 Chronic systolic (congestive) heart failure: Secondary | ICD-10-CM

## 2021-07-03 DIAGNOSIS — I5042 Chronic combined systolic (congestive) and diastolic (congestive) heart failure: Secondary | ICD-10-CM | POA: Diagnosis not present

## 2021-07-03 DIAGNOSIS — Z923 Personal history of irradiation: Secondary | ICD-10-CM | POA: Diagnosis not present

## 2021-07-03 DIAGNOSIS — R590 Localized enlarged lymph nodes: Secondary | ICD-10-CM

## 2021-07-03 DIAGNOSIS — E119 Type 2 diabetes mellitus without complications: Secondary | ICD-10-CM | POA: Insufficient documentation

## 2021-07-03 DIAGNOSIS — J069 Acute upper respiratory infection, unspecified: Secondary | ICD-10-CM | POA: Insufficient documentation

## 2021-07-03 DIAGNOSIS — J3489 Other specified disorders of nose and nasal sinuses: Secondary | ICD-10-CM | POA: Insufficient documentation

## 2021-07-03 DIAGNOSIS — I110 Hypertensive heart disease with heart failure: Secondary | ICD-10-CM | POA: Diagnosis not present

## 2021-07-03 DIAGNOSIS — G4733 Obstructive sleep apnea (adult) (pediatric): Secondary | ICD-10-CM | POA: Insufficient documentation

## 2021-07-03 DIAGNOSIS — I700 Atherosclerosis of aorta: Secondary | ICD-10-CM | POA: Insufficient documentation

## 2021-07-03 DIAGNOSIS — Z7901 Long term (current) use of anticoagulants: Secondary | ICD-10-CM | POA: Diagnosis not present

## 2021-07-03 DIAGNOSIS — Z9079 Acquired absence of other genital organ(s): Secondary | ICD-10-CM | POA: Insufficient documentation

## 2021-07-03 DIAGNOSIS — R0602 Shortness of breath: Secondary | ICD-10-CM | POA: Diagnosis not present

## 2021-07-03 DIAGNOSIS — D509 Iron deficiency anemia, unspecified: Secondary | ICD-10-CM | POA: Insufficient documentation

## 2021-07-03 DIAGNOSIS — C3411 Malignant neoplasm of upper lobe, right bronchus or lung: Secondary | ICD-10-CM | POA: Insufficient documentation

## 2021-07-03 DIAGNOSIS — Z8349 Family history of other endocrine, nutritional and metabolic diseases: Secondary | ICD-10-CM | POA: Insufficient documentation

## 2021-07-03 DIAGNOSIS — Z8249 Family history of ischemic heart disease and other diseases of the circulatory system: Secondary | ICD-10-CM | POA: Insufficient documentation

## 2021-07-03 DIAGNOSIS — R5383 Other fatigue: Secondary | ICD-10-CM | POA: Diagnosis not present

## 2021-07-03 DIAGNOSIS — Z85118 Personal history of other malignant neoplasm of bronchus and lung: Secondary | ICD-10-CM | POA: Insufficient documentation

## 2021-07-03 DIAGNOSIS — I2510 Atherosclerotic heart disease of native coronary artery without angina pectoris: Secondary | ICD-10-CM | POA: Diagnosis not present

## 2021-07-03 DIAGNOSIS — Z79899 Other long term (current) drug therapy: Secondary | ICD-10-CM | POA: Diagnosis not present

## 2021-07-03 LAB — CBC WITH DIFFERENTIAL/PLATELET
Abs Immature Granulocytes: 0.01 10*3/uL (ref 0.00–0.07)
Basophils Absolute: 0 10*3/uL (ref 0.0–0.1)
Basophils Relative: 1 %
Eosinophils Absolute: 0.5 10*3/uL (ref 0.0–0.5)
Eosinophils Relative: 11 %
HCT: 34.7 % — ABNORMAL LOW (ref 39.0–52.0)
Hemoglobin: 11.2 g/dL — ABNORMAL LOW (ref 13.0–17.0)
Immature Granulocytes: 0 %
Lymphocytes Relative: 20 %
Lymphs Abs: 1 10*3/uL (ref 0.7–4.0)
MCH: 26.6 pg (ref 26.0–34.0)
MCHC: 32.3 g/dL (ref 30.0–36.0)
MCV: 82.4 fL (ref 80.0–100.0)
Monocytes Absolute: 0.8 10*3/uL (ref 0.1–1.0)
Monocytes Relative: 16 %
Neutro Abs: 2.6 10*3/uL (ref 1.7–7.7)
Neutrophils Relative %: 52 %
Platelets: 210 10*3/uL (ref 150–400)
RBC: 4.21 MIL/uL — ABNORMAL LOW (ref 4.22–5.81)
RDW: 17.4 % — ABNORMAL HIGH (ref 11.5–15.5)
WBC: 4.9 10*3/uL (ref 4.0–10.5)
nRBC: 0 % (ref 0.0–0.2)

## 2021-07-03 LAB — COMPREHENSIVE METABOLIC PANEL
ALT: 12 U/L (ref 0–44)
AST: 23 U/L (ref 15–41)
Albumin: 3.8 g/dL (ref 3.5–5.0)
Alkaline Phosphatase: 56 U/L (ref 38–126)
Anion gap: 11 (ref 5–15)
BUN: 22 mg/dL (ref 8–23)
CO2: 21 mmol/L — ABNORMAL LOW (ref 22–32)
Calcium: 8.9 mg/dL (ref 8.9–10.3)
Chloride: 102 mmol/L (ref 98–111)
Creatinine, Ser: 1.2 mg/dL (ref 0.61–1.24)
GFR, Estimated: 58 mL/min — ABNORMAL LOW (ref >60)
Glucose, Bld: 106 mg/dL — ABNORMAL HIGH (ref 70–99)
Potassium: 4.1 mmol/L (ref 3.5–5.1)
Sodium: 134 mmol/L — ABNORMAL LOW (ref 135–145)
Total Bilirubin: 0.8 mg/dL (ref 0.3–1.2)
Total Protein: 7.3 g/dL (ref 6.5–8.1)

## 2021-07-03 LAB — BRAIN NATRIURETIC PEPTIDE: B Natriuretic Peptide: 771.5 pg/mL — ABNORMAL HIGH (ref 0.0–100.0)

## 2021-07-03 NOTE — Progress Notes (Signed)
Patient here for follow up. Pt's wife reports that patient has not been doing well. Pt recently completed radiation txs. Patient's appetite is very poor. Pt has cough that has gotten worse. Cough starts immediately after eating. Pt took in home covid test and at urgent care and they were both negative.

## 2021-07-03 NOTE — Telephone Encounter (Signed)
Radiology needs MD signature for stat imaging  Order.

## 2021-07-03 NOTE — Progress Notes (Signed)
Symptom Management [place]  [phone] [phone]  Patient Care Team: Chrismon, [name], PA-C as PCP - General (Family Medicine) [name], MD as PCP - Cardiology (Cardiology) Deboraha Sprang, MD as PCP - Electrophysiology (Cardiology) Dasher, [name], MD as Consulting Physician (Dermatology) Birder Robson, MD as Referring Physician (Ophthalmology) Lucky Cowboy Erskine Squibb, MD as Referring Physician (Vascular Surgery) Carloyn Manner, MD as Referring Physician (Otolaryngology) Noreene Filbert, MD as Referring Physician (Radiation Oncology) Erby Pian, MD as Referring Physician (Specialist)   Name of the patient: Howard [name]  [id number]  [birth year]   Date of visit: 07/03/21  Reason for Consult: Mr. Ramal Eckhardt is an 87 year old man with multiple medical problems including COPD, OSA on CPAP, NICM with EF of 20 to 25%, CHB status post PPM, PAD status post stenting, history of splenic infarct on chronic anticoagulation, and recurrent stage III adenocarcinoma of the right lung status post XRT.   Patient last saw Dr. Patsey Berthold on 05/28/2021 at which time his COPD and nocturnal hypoxia seem to be at baseline.  It was felt that his cardiomyopathy likely played a significant role in his ongoing dyspnea.  Patient saw cardiology on 06/04/2021 for routine follow-up at which time he clinically appeared to be euvolemic and well compensated. He is followed by cardiac rehab.  Patient completed XRT for lung cancer on 06/19/2021.  Patient contacted PCP on 06/22/2021 complaining of several weeks of postnasal drip, rhinorrhea, and productive cough with clear phlegm.  He was recommended to seek evaluation by urgent care.  Patient was seen in urgent care on 07/01/2021 with fever (T-max of 101) and continued nasal congestion, postnasal drip, and productive cough.  Flu and COVID testing was negative.  Chest x-ray was obtained, which was unrevealing.   Patient was started on amoxicillin for suspected sinusitis.  Patient returns to [facility] today for follow-up.  He continues to endorse rhinorrhea with clear nasal drainage/postnasal drip.  However, he denies fever or chills.  No shortness of breath, lower extremity edema, or chest pain.  He does have occasional cough with clear phlegm.  Overall, he feels like symptoms are improving.  Patient does endorse poor oral intake and weight loss over the past several weeks.  Denies any neurologic complaints. Denies recent fevers or illnesses. Denies any easy bleeding or bruising. Reports good appetite and denies weight loss. Denies chest pain. Denies any nausea, vomiting, constipation, or diarrhea. Denies urinary complaints. Patient offers no further specific complaints today.  PAST MEDICAL HISTORY: Past Medical History:  Diagnosis Date   Arthritis    Bell palsy    Bell's palsy 04/12/2015   Cancer [facility])    prostate and skin   Chronic combined systolic and diastolic CHF, NYHA class 1 ([place])    a. 07/2014 Echo: EF 35-40%, Gr 1 DD.   Complete heart block ([place])    a. 11/2010 s/p SJM 2210 [employer] DC PPM, ser# [id number].   Depression    Diabetes mellitus without complication ([place])    Fall 11-10-14   GERD (gastroesophageal reflux disease)    History of prostate cancer    Hyperlipidemia    Hypertension    LBBB (left bundle branch block)    Left-sided Bell's palsy    Lung cancer ([place]) 2016   NICM (nonischemic cardiomyopathy) ([place])    a. 07/2014 Echo: EF 35-40%, mid-apicalanteroseptal DK, Gr 1 DD, mild-mod dil LA.   Non-obstructive CAD    a. 07/2014 Abnl MV;  b. 08/2014 Cath: LM  nl, LAD 30p, RI 40p, LCX nl, OM1 40, RCA dominant 30p, 70d-->Med Rx.   Poor balance    Presence of permanent cardiac pacemaker    Sleep apnea    a. cpap   Vertigo    WPW (Wolff-Parkinson-White syndrome)    a. S/P RFCA 1991.    PAST SURGICAL HISTORY:  Past Surgical History:  Procedure Laterality Date   ABDOMINAL AORTIC  ENDOVASCULAR STENT GRAFT  08/25/2019   Procedure: ABDOMINAL AORTIC ENDOVASCULAR STENT GRAFT;  Surgeon: Algernon Huxley, MD;  Location: [facility] ORS;  Service: Vascular;;   ANGIOPLASTY Left 08/25/2019   Procedure: ANGIOPLASTY;  Surgeon: Algernon Huxley, MD;  Location: [facility] ORS;  Service: Vascular;  Laterality: Left;  left SFA and stent placement   APPLICATION OF WOUND VAC Left 06/07/2015   Procedure: APPLICATION OF WOUND VAC;  Surgeon: Robert Bellow, MD;  Location: [facility] ORS;  Service: General;  Laterality: Left;  left upper back   [place]  08/26/2014   Single vessel obstructive CAD   CARPAL TUNNEL RELEASE  04-04-15   [facility]   CATARACT EXTRACTION  07-31-11 and 09-18-11   Catheter ablation  1991   for WPW   cervical fusion     CHOLECYSTECTOMY  09-07-14   ENDARTERECTOMY FEMORAL Left 08/25/2019   Procedure: ENDARTERECTOMY FEMORAL;  Surgeon: Algernon Huxley, MD;  Location: [facility] ORS;  Service: Vascular;  Laterality: Left;  common and produndis    ENDOVASCULAR REPAIR/STENT GRAFT Right 08/25/2019   Procedure: ENDOVASCULAR REPAIR/STENT GRAFT;  Surgeon: Algernon Huxley, MD;  Location: [facility] ORS;  Service: Vascular;  Laterality: Right;  renal artery   HAND SURGERY     right 1993; left 2005   [place] / REPLACE / REMOVE PACEMAKER     INSERTION OF ILIAC STENT Bilateral 08/25/2019   Procedure: INSERTION OF ILIAC STENT;  Surgeon: Algernon Huxley, MD;  Location: [facility] ORS;  Service: Vascular;  Laterality: Bilateral;   JOINT REPLACEMENT Left 2013   knee   JOINT REPLACEMENT Right 2004   knee   KNEE SURGERY     left knee 1991 and 1992; right knee [place] N/A 08/26/2014   Procedure: LEFT HEART CATHETERIZATION WITH CORONARY ANGIOGRAM;  Surgeon: [name], MD;  Location: [facility] CATH LAB;  Service: Cardiovascular;  Laterality: N/A;   LOWER EXTREMITY ANGIOGRAPHY Left 08/23/2019   Procedure: Lower Extremity Angiography;  Surgeon: Algernon Huxley, MD;  Location: [place] CV LAB;  Service: Cardiovascular;  Laterality: Left;   LUMBAR LAMINECTOMY/DECOMPRESSION MICRODISCECTOMY N/A 06/07/2014   Procedure: LUMBAR FOUR TO FIVE LUMBAR LAMINECTOMY/DECOMPRESSION MICRODISCECTOMY 1 LEVEL;  Surgeon: [name], MD;  Location: [place] NEURO ORS;  Service: Neurosurgery;  Laterality: N/A;   LUNG BIOPSY Right 2016   Dr [name]   MOHS SURGERY     PACEMAKER INSERTION     PPM-- St Jude 11/30/10 by [name]   PPM GENERATOR CHANGEOUT N/A 07/09/2019   Procedure: PPM GENERATOR CHANGEOUT;  Surgeon: Deboraha Sprang, MD;  Location: [place] CV LAB;  Service: Cardiovascular;  Laterality: N/A;   PROSTATE SURGERY     cancer--1998, prostatectomy   REPLACEMENT TOTAL KNEE     2004   RIGHT/LEFT HEART CATH AND CORONARY ANGIOGRAPHY Bilateral 04/13/2021   Procedure: RIGHT/LEFT HEART CATH AND CORONARY ANGIOGRAPHY;  Surgeon: Wellington Hampshire, MD;  Location: [place] CV LAB;  Service: Cardiovascular;  Laterality: Bilateral;   ruptured disc     1962 and 1998   TEE WITHOUT CARDIOVERSION N/A 09/01/2019   Procedure: TRANSESOPHAGEAL ECHOCARDIOGRAM (TEE);  Surgeon: [name], MD;  Location: [facility] ORS;  Service: Cardiovascular;  Laterality: N/A;   TEMPORARY PACEMAKER N/A 07/09/2019   Procedure: TEMPORARY PACEMAKER;  Surgeon: Deboraha Sprang, MD;  Location: [place] CV LAB;  Service: Cardiovascular;  Laterality: N/A;   TRIGGER FINGER RELEASE  01-24-15   WOUND DEBRIDEMENT Left 06/07/2015   Procedure: DEBRIDEMENT WOUND;  Surgeon: Robert Bellow, MD;  Location: [facility] ORS;  Service: General;  Laterality: Left;  left upper back    HEMATOLOGY/ONCOLOGY HISTORY:  Oncology History   No history exists.    ALLERGIES:  is allergic to sulfa antibiotics.  MEDICATIONS:  Current Outpatient Medications  Medication Sig Dispense Refill   acetaminophen (TYLENOL) 500 MG tablet Take 1,000 mg by mouth every 6 (six) hours as needed for moderate pain.     albuterol (VENTOLIN HFA)  108 (90 Base) MCG/ACT inhaler Inhale 2 puffs into the lungs every 6 (six) hours as needed for wheezing or shortness of breath. 8 g 2   amoxicillin (AMOXIL) 500 MG capsule Take 2 capsules (1,000 mg total) by mouth 3 (three) times daily for 7 days. 42 capsule 0   aspirin EC 81 MG tablet Take 81 mg by mouth daily.     cetirizine (ZYRTEC) 10 MG tablet Take 10 mg by mouth daily.     Cholecalciferol (VITAMIN D3) 1000 UNITS CAPS Take 1,000 Units by mouth daily.     donepezil (ARICEPT) 5 MG tablet Take 5 mg by mouth daily.     ELIQUIS 5 MG TABS tablet TAKE 1 TABLET TWICE A DAY  (SWITCHED FROM PLAVIX) 60 tablet 11   ezetimibe (ZETIA) 10 MG tablet TAKE 1 TABLET DAILY 90 tablet 2   Fluticasone-Umeclidin-Vilant (TRELEGY ELLIPTA) 100-62.5-25 MCG/INH AEPB Inhale 1 puff into the lungs daily. 180 each 3   furosemide (LASIX) 20 MG tablet Take 1 tablet (20 mg total) by mouth daily. 30 tablet 5   Iron-Vitamin C (VITRON-C) 65-125 MG TABS Take 1 tablet by mouth daily. 30 tablet 2   isosorbide mononitrate (IMDUR) 30 MG 24 hr tablet TAKE 1 TABLET DAILY 90 tablet 2   metFORMIN (GLUCOPHAGE) 500 MG tablet Take 1 tablet (500 mg total) by mouth 2 (two) times daily with a meal. 180 tablet 1   metoprolol succinate (TOPROL-XL) 25 MG 24 hr tablet TAKE 1 TABLET AT BEDTIME 90 tablet 2   montelukast (SINGULAIR) 10 MG tablet Take 10 mg by mouth daily as needed (sneezing/allergies.).     Multiple Vitamin (MULTIVITAMIN WITH MINERALS) TABS tablet Take 1 tablet by mouth daily. One-A-Day Multivitamin     omeprazole (PRILOSEC) 40 MG capsule TAKE 1 CAPSULE TWICE DAILY AS NEEDED 180 capsule 3   promethazine-dextromethorphan (PROMETHAZINE-DM) 6.25-15 MG/5ML syrup Take 5 mLs by mouth 3 (three) times daily as needed for cough. 118 mL 0   sacubitril-valsartan (ENTRESTO) 49-51 MG Take 1 tablet by mouth 2 (two) times daily. 180 tablet 0   simvastatin (ZOCOR) 40 MG tablet TAKE 1 TABLET AT BEDTIME 90 tablet 3   spironolactone (ALDACTONE) 25 MG  tablet Take 1 tablet (25 mg total) by mouth daily. 90 tablet 3   No current facility-administered medications for this visit.    VITAL SIGNS: There were no vitals taken for this visit. There were no vitals filed for this visit.  Estimated body mass index is 27.32 kg/m  as calculated from the following:   Height as of 07/01/21: 5\' 9"  (1.753 m).   Weight as of 07/01/21: 185 lb (83.9 kg).  LABS: CBC:    Component Value Date/Time   WBC 4.2 06/12/2021 1007   HGB 11.8 (L) 06/12/2021 1007   HGB 13.2 11/13/2020 0833   HCT 36.8 (L) 06/12/2021 1007   HCT 41.4 11/13/2020 0833   PLT 185 06/12/2021 1007   PLT 285 11/13/2020 0833   MCV 82.3 06/12/2021 1007   MCV 79 11/13/2020 0833   MCV 82 11/10/2014 2041   NEUTROABS 2.8 11/13/2020 0833   NEUTROABS 9.2 (H) 09/06/2014 0801   LYMPHSABS 2.1 11/13/2020 0833   LYMPHSABS 1.2 09/06/2014 0801   MONOABS 0.7 12/10/2019 1044   MONOABS 1.3 (H) 09/06/2014 0801   EOSABS 0.4 11/13/2020 0833   EOSABS 0.1 09/06/2014 0801   BASOSABS 0.1 11/13/2020 0833   BASOSABS 0.1 09/06/2014 0801   Comprehensive Metabolic Panel:    Component Value Date/Time   NA 138 05/22/2021 1023   NA 139 11/13/2020 0833   NA 137 11/10/2014 2041   K 3.9 05/22/2021 1023   K 3.9 11/10/2014 2041   CL 108 05/22/2021 1023   CL 104 11/10/2014 2041   CO2 19 (L) 05/22/2021 1023   CO2 24 11/10/2014 2041   BUN 25 (H) 05/22/2021 1023   BUN 23 11/13/2020 0833   BUN 13 11/10/2014 2041   CREATININE 1.24 05/22/2021 1023   CREATININE 0.81 09/30/2017 1003   GLUCOSE 128 (H) 05/22/2021 1023   GLUCOSE 154 (H) 11/10/2014 2041   CALCIUM 8.7 (L) 05/22/2021 1023   CALCIUM 8.5 11/10/2014 2041   AST 22 04/16/2021 1149   AST 17 09/06/2014 0801   ALT 13 04/16/2021 1149   ALT 22 09/06/2014 0801   ALKPHOS 48 04/16/2021 1149   ALKPHOS 67 09/06/2014 0801   BILITOT 0.8 04/16/2021 1149   BILITOT 0.5 11/13/2020 0833   BILITOT 0.6 09/06/2014 0801   PROT 6.9 04/16/2021 1149   PROT 6.7 11/13/2020  0833   PROT 7.1 09/06/2014 0801   ALBUMIN 3.5 04/16/2021 1149   ALBUMIN 4.2 11/13/2020 0833   ALBUMIN 3.4 09/06/2014 0801    RADIOGRAPHIC STUDIES: DG Chest 2 View  Result Date: 07/01/2021 CLINICAL DATA:  Fever and cough.  History of lung cancer. EXAM: CHEST - 2 VIEW COMPARISON:  Chest x-ray dated 02/23/2021. FINDINGS: Heart size and mediastinal contours are stable. LEFT chest wall pacemaker/ICD apparatus appears stable. Lungs are clear. No pleural effusion or pneumothorax is seen. No acute-appearing osseous abnormality. IMPRESSION: No active cardiopulmonary disease. No evidence of pneumonia or pulmonary edema. Electronically Signed   By: Franki Cabot M.D.   On: 07/01/2021 11:33    PERFORMANCE STATUS (ECOG) : 1 - Symptomatic but completely ambulatory  Review of Systems Unless otherwise noted, a complete review of systems is negative.  Physical Exam General: NAD Cardiovascular: regular rate and rhythm Pulmonary: clear ant fields Abdomen: soft, nontender, + bowel sounds GU: no suprapubic tenderness Extremities: no edema, no joint deformities Skin: no rashes Neurological: Weakness but otherwise nonfocal  Assessment and Plan- Patient is a 87 y.o. male with multiple medical problems including COPD, OSA on CPAP, NICM with EF of 20 to 25%, CHB status post PPM, PAD status post stenting, history of splenic infarct on chronic anticoagulation, and recurrent stage III adenocarcinoma of the right lung status post XRT.  Patient seen in [facility] for follow-up after being diagnosed with sinusitis at urgent care on 7/10.  URI -  patient reports improvement in overall symptoms.  CXR normal. Labs grossly unchanged from baseline.  Recommended that patient continue course of amoxicillin.  Suggested that he try nasal steroid for rhinorrhea.  Discussed triggers for repeat clinic or ER evaluation.  Weight loss -weight has been downtrending 8lbs over the past month.  Patient reports early satiety.  Suggested that he  start oral nutritional supplements daily.  Will refer to nutritionist.      Patient expressed understanding and was in agreement with this plan. He also understands that He can call clinic at any time with any questions, concerns, or complaints.   Thank you for allowing me to participate in the care of this very pleasant patient.   Time Total: 25 minutes  Visit consisted of counseling and education dealing with the complex and emotionally intense issues of symptom management and palliative care in the setting of serious and potentially life-threatening illness.Greater than 50%  of this time was spent counseling and coordinating care related to the above assessment and plan.  Signed by: Altha Harm, PhD, NP-C   ;tom

## 2021-07-03 NOTE — Telephone Encounter (Signed)
Howard [name], Dr. Tasia Catchings would like for pt to be seen in [facility]

## 2021-07-03 NOTE — Telephone Encounter (Signed)
Spoke with patients wife regarding urgent care visit on 7/10. She states that the coughing comes in spells. He is producing lots of clear mucus. He had a fever still last night but hasn't checked today. He is not drinking fluids but is resting a lot. He is taking tylenol, amoxicillin, and promethazine. He has no appetite.

## 2021-07-03 NOTE — Patient Instructions (Signed)
Recommend starting daily nutritional supplements such as Ensure Plus or Boost Plus. Will refer to nutritionist.   Complete course of amoxicillin.  Recommend starting nasal steroid such as Flonase daily.  Flonase can be purchased over-the-counter.  Speak with cardiology regarding their recommendations for fluid intake.  Return to clinic as needed for worsening symptoms or if he fails to improve.

## 2021-07-04 ENCOUNTER — Encounter: Payer: Medicare Other | Admitting: *Deleted

## 2021-07-04 ENCOUNTER — Telehealth: Payer: Self-pay | Admitting: Hospice and Palliative Medicine

## 2021-07-04 ENCOUNTER — Encounter: Payer: Self-pay | Admitting: *Deleted

## 2021-07-04 ENCOUNTER — Other Ambulatory Visit: Payer: Self-pay

## 2021-07-04 DIAGNOSIS — I5042 Chronic combined systolic (congestive) and diastolic (congestive) heart failure: Secondary | ICD-10-CM | POA: Diagnosis not present

## 2021-07-04 DIAGNOSIS — Z79899 Other long term (current) drug therapy: Secondary | ICD-10-CM | POA: Diagnosis not present

## 2021-07-04 NOTE — Progress Notes (Signed)
Cardiac Individual Treatment Plan  Patient Details  Name: Howard [name] MRN: [id number] Date of Birth: Mar 25, 1931 Referring Provider:   Flowsheet Row Cardiac Rehab from 05/24/2021 in [facility] Cardiac and Pulmonary Rehab  Referring Provider Gollan       Initial Encounter Date:  Flowsheet Row Cardiac Rehab from 05/24/2021 in [facility] Cardiac and Pulmonary Rehab  Date 05/24/21       Visit Diagnosis: Heart failure, systolic and diastolic, chronic ([place])  Patient's Home Medications on Admission:  Current Outpatient Medications:    acetaminophen (TYLENOL) 500 MG tablet, Take 1,000 mg by mouth every 6 (six) hours as needed for moderate pain., Disp: , Rfl:    albuterol (VENTOLIN HFA) 108 (90 Base) MCG/ACT inhaler, Inhale 2 puffs into the lungs every 6 (six) hours as needed for wheezing or shortness of breath., Disp: 8 g, Rfl: 2   amoxicillin (AMOXIL) 500 MG capsule, Take 2 capsules (1,000 mg total) by mouth 3 (three) times daily for 7 days., Disp: 42 capsule, Rfl: 0   aspirin EC 81 MG tablet, Take 81 mg by mouth daily., Disp: , Rfl:    cetirizine (ZYRTEC) 10 MG tablet, Take 10 mg by mouth daily., Disp: , Rfl:    Cholecalciferol (VITAMIN D3) 1000 UNITS CAPS, Take 1,000 Units by mouth daily., Disp: , Rfl:    donepezil (ARICEPT) 5 MG tablet, Take 5 mg by mouth daily., Disp: , Rfl:    ELIQUIS 5 MG TABS tablet, TAKE 1 TABLET TWICE A DAY  (SWITCHED FROM PLAVIX), Disp: 60 tablet, Rfl: 11   ezetimibe (ZETIA) 10 MG tablet, TAKE 1 TABLET DAILY, Disp: 90 tablet, Rfl: 2   Fluticasone-Umeclidin-Vilant (TRELEGY ELLIPTA) 100-62.5-25 MCG/INH AEPB, Inhale 1 puff into the lungs daily., Disp: 180 each, Rfl: 3   furosemide (LASIX) 20 MG tablet, Take 1 tablet (20 mg total) by mouth daily., Disp: 30 tablet, Rfl: 5   Iron-Vitamin C (VITRON-C) 65-125 MG TABS, Take 1 tablet by mouth daily., Disp: 30 tablet, Rfl: 2   isosorbide mononitrate (IMDUR) 30 MG 24 hr tablet, TAKE 1 TABLET DAILY, Disp: 90 tablet, Rfl: 2   metFORMIN  (GLUCOPHAGE) 500 MG tablet, Take 1 tablet (500 mg total) by mouth 2 (two) times daily with a meal., Disp: 180 tablet, Rfl: 1   metoprolol succinate (TOPROL-XL) 25 MG 24 hr tablet, TAKE 1 TABLET AT BEDTIME, Disp: 90 tablet, Rfl: 2   montelukast (SINGULAIR) 10 MG tablet, Take 10 mg by mouth daily as needed (sneezing/allergies.)., Disp: , Rfl:    Multiple Vitamin (MULTIVITAMIN WITH MINERALS) TABS tablet, Take 1 tablet by mouth daily. One-A-Day Multivitamin, Disp: , Rfl:    omeprazole (PRILOSEC) 40 MG capsule, TAKE 1 CAPSULE TWICE DAILY AS NEEDED, Disp: 180 capsule, Rfl: 3   promethazine-dextromethorphan (PROMETHAZINE-DM) 6.25-15 MG/5ML syrup, Take 5 mLs by mouth 3 (three) times daily as needed for cough., Disp: 118 mL, Rfl: 0   sacubitril-valsartan (ENTRESTO) 49-51 MG, Take 1 tablet by mouth 2 (two) times daily., Disp: 180 tablet, Rfl: 0   simvastatin (ZOCOR) 40 MG tablet, TAKE 1 TABLET AT BEDTIME, Disp: 90 tablet, Rfl: 3   spironolactone (ALDACTONE) 25 MG tablet, Take 1 tablet (25 mg total) by mouth daily., Disp: 90 tablet, Rfl: 3  Past Medical History: Past Medical History:  Diagnosis Date   Arthritis    Bell palsy    Bell's palsy 04/12/2015   Cancer (HCC)    prostate and skin   Chronic combined systolic and diastolic CHF, NYHA class 1 ([place])  a. 07/2014 Echo: EF 35-40%, Gr 1 DD.   Complete heart block ([place])    a. 11/2010 s/p SJM 2210 [facility] DC PPM, ser# [id number].   Depression    Diabetes mellitus without complication ([place])    Fall 11-10-14   GERD (gastroesophageal reflux disease)    History of prostate cancer    Hyperlipidemia    Hypertension    LBBB (left bundle branch block)    Left-sided Bell's palsy    Lung cancer ([place]) 2016   NICM (nonischemic cardiomyopathy) ([place])    a. 07/2014 Echo: EF 35-40%, mid-apicalanteroseptal DK, Gr 1 DD, mild-mod dil LA.   Non-obstructive CAD    a. 07/2014 Abnl MV;  b. 08/2014 Cath: LM nl, LAD 30p, RI 40p, LCX nl, OM1 40, RCA dominant 30p, 70d-->Med Rx.    Poor balance    Presence of permanent cardiac pacemaker    Sleep apnea    a. cpap   Vertigo    WPW (Wolff-Parkinson-White syndrome)    a. S/P RFCA 1991.    Tobacco Use: Social History   Tobacco Use  Smoking Status Former   Packs/day: 1.00   Years: 56.00   Pack years: 56.00   Types: Cigarettes   Quit date: 2011   Years since quitting: 11.5  Smokeless Tobacco Never    Labs: Recent Review Scientist, physiological     Labs for ITP Cardiac and Pulmonary Rehab Latest Ref Rng & Units 09/07/2019 12/10/2019 05/08/2020 11/09/2020 11/13/2020   Cholestrol 100 - 199 mg/dL - 136 134 - 130   LDLCALC 0 - 99 mg/dL - 75 66 - 63   HDL >39 mg/dL - 42 52 - 43   Trlycerides 0 - 149 mg/dL - 93 83 - 137   Hemoglobin A1c 4.0 - 5.6 % 7.2(H) 7.5(H) 7.4(H) 7.7(A) -   HCO3 20.0 - 24.0 mEq/L - - - - -   TCO2 0 - 100 mmol/L - - - - -   O2SAT % - - - - -        Exercise Target Goals: Exercise Program Goal: Individual exercise prescription set using results from initial 6 min walk test and THRR while considering  patient's activity barriers and safety.   Exercise Prescription Goal: Initial exercise prescription builds to 30-45 minutes a day of aerobic activity, 2-3 days per week.  Home exercise guidelines will be given to patient during program as part of exercise prescription that the participant will acknowledge.   Education: Aerobic Exercise: - Group verbal and visual presentation on the components of exercise prescription. Introduces F.I.T.T principle from ACSM for exercise prescriptions.  Reviews F.I.T.T. principles of aerobic exercise including progression. Written material given at graduation.   Education: Resistance Exercise: - Group verbal and visual presentation on the components of exercise prescription. Introduces F.I.T.T principle from ACSM for exercise prescriptions  Reviews F.I.T.T. principles of resistance exercise including progression. Written material given at graduation. Flowsheet Row  Cardiac Rehab from 07/04/2021 in [facility] Cardiac and Pulmonary Rehab  Date 06/20/21  Educator [facility]  Instruction Review Code 1- Verbalizes Understanding        Education: Exercise & Equipment Safety: - Individual verbal instruction and demonstration of equipment use and safety with use of the equipment. Flowsheet Row Cardiac Rehab from 07/04/2021 in [facility] Cardiac and Pulmonary Rehab  Date 05/24/21  Educator AS  Instruction Review Code 1- Verbalizes Understanding       Education: Exercise Physiology & General Exercise Guidelines: - Group verbal and written instruction with models to  review the exercise physiology of the cardiovascular system and associated critical values. Provides general exercise guidelines with specific guidelines to those with heart or lung disease.    Education: Flexibility, Balance, Mind/Body Relaxation: - Group verbal and visual presentation with interactive activity on the components of exercise prescription. Introduces F.I.T.T principle from ACSM for exercise prescriptions. Reviews F.I.T.T. principles of flexibility and balance exercise training including progression. Also discusses the mind body connection.  Reviews various relaxation techniques to help reduce and manage stress (i.e. Deep breathing, progressive muscle relaxation, and visualization). Balance handout provided to take home. Written material given at graduation. Flowsheet Row Cardiac Rehab from 07/04/2021 in [facility] Cardiac and Pulmonary Rehab  Date 06/27/21  Educator [facility]  Instruction Review Code 1- Verbalizes Understanding       Activity Barriers & Risk Stratification:  Activity Barriers & Cardiac Risk Stratification - 05/14/21 1406       Activity Barriers & Cardiac Risk Stratification   Activity Barriers Back Problems;Joint Problems;Decreased Ventricular Function;Muscular Weakness    Cardiac Risk Stratification High             6 Minute Walk:  6 Minute Walk     Row Name 05/24/21 1531          6 Minute Walk   Distance 590 feet     Walk Time 5 minutes     # of Rest Breaks 2     MPH 1.34     METS 1.1     RPE 17     Perceived Dyspnea  3     VO2 Peak 3.86     Symptoms Yes (comment)     Comments leg pain/cramping due to vascular issue 5/10     Resting HR 90 bpm     Resting BP 126/62     Resting Oxygen Saturation  98 %     Exercise Oxygen Saturation  during 6 min walk 96 %     Max Ex. HR 118 bpm     Max Ex. BP 156/72     2 Minute Post BP 140/70              Oxygen Initial Assessment:   Oxygen Re-Evaluation:   Oxygen Discharge (Final Oxygen Re-Evaluation):   Initial Exercise Prescription:  Initial Exercise Prescription - 05/24/21 1500       Date of Initial Exercise RX and Referring Provider   Date 05/24/21    Referring Provider Gollan      NuStep   Level 1    SPM 80    Minutes 15    METs 1.1      Recumbant Elliptical   Level 1    RPM 50    Minutes 15    METs 1.1      REL-XR   Level 1    Speed 50    Minutes 15    METs 1.1      Track   Laps 10    Minutes 15      Prescription Details   Frequency (times per week) 3    Duration Progress to 30 minutes of continuous aerobic without signs/symptoms of physical distress      Intensity   THRR 40-80% of Max Heartrate 107-124    Ratings of Perceived Exertion 11-13    Perceived Dyspnea 0-4      Progression   Progression Continue to progress workloads to maintain intensity without signs/symptoms of physical distress.      Horticulturist, commercial  Prescription Yes    Weight 3 lb    Reps 10-15             Perform Capillary Blood Glucose checks as needed.  Exercise Prescription Changes:   Exercise Prescription Changes     Row Name 05/24/21 1600 06/26/21 1500           Response to Exercise   Blood Pressure (Admit) 126/62 134/74      Blood Pressure (Exercise) 156/72 126/68      Blood Pressure (Exit) 142/70 130/68      Heart Rate (Admit) 90 bpm 89 bpm      Heart Rate  (Exercise) 118 bpm 111 bpm      Heart Rate (Exit) 93 bpm 87 bpm      Oxygen Saturation (Admit) 98 % --      Oxygen Saturation (Exercise) 96 % --      Rating of Perceived Exertion (Exercise) 17 12      Perceived Dyspnea (Exercise) 3 --      Symptoms 5/10 leg cramp/pain fatigue      Comments -- second full day of exercise      Duration -- Progress to 30 minutes of  aerobic without signs/symptoms of physical distress      Intensity -- THRR unchanged             Progression      Progression -- Continue to progress workloads to maintain intensity without signs/symptoms of physical distress.      Average METs -- 1.97             Resistance Training      Training Prescription -- Yes      Weight -- 3 lb      Reps -- 10-15             Interval Training      Interval Training -- No             NuStep      Level -- 1      Minutes -- 15      METs -- 2.5             Track      Laps -- 8      Minutes -- 15      METs -- 1.44              Exercise Comments:   Exercise Comments     Row Name 06/20/21 1018           Exercise Comments First full day of exercise!  Patient was oriented to gym and equipment including functions, settings, policies, and procedures.  Patient's individual exercise prescription and treatment plan were reviewed.  All starting workloads were established based on the results of the 6 minute walk test done at initial orientation visit.  The plan for exercise progression was also introduced and progression will be customized based on patient's performance and goals.                Exercise Goals and Review:   Exercise Goals     Row Name 05/24/21 1602             Exercise Goals   Increase Physical Activity Yes       Intervention Provide advice, education, support and counseling about physical activity/exercise needs.;Develop an individualized exercise prescription for aerobic and resistive training based on initial evaluation findings, risk  stratification, comorbidities and participant's personal goals.  Expected Outcomes Short Term: Attend rehab on a regular basis to increase amount of physical activity.;Long Term: Add in home exercise to make exercise part of routine and to increase amount of physical activity.;Long Term: Exercising regularly at least 3-5 days a week.       Increase Strength and Stamina Yes       Intervention Provide advice, education, support and counseling about physical activity/exercise needs.;Develop an individualized exercise prescription for aerobic and resistive training based on initial evaluation findings, risk stratification, comorbidities and participant's personal goals.       Expected Outcomes Short Term: Increase workloads from initial exercise prescription for resistance, speed, and METs.;Long Term: Improve cardiorespiratory fitness, muscular endurance and strength as measured by increased METs and functional capacity (6MWT)       Able to understand and use rate of perceived exertion (RPE) scale Yes       Intervention Provide education and explanation on how to use RPE scale       Expected Outcomes Short Term: Able to use RPE daily in rehab to express subjective intensity level;Long Term:  Able to use RPE to guide intensity level when exercising independently       Able to understand and use Dyspnea scale Yes       Intervention Provide education and explanation on how to use Dyspnea scale       Expected Outcomes Short Term: Able to use Dyspnea scale daily in rehab to express subjective sense of shortness of breath during exertion;Long Term: Able to use Dyspnea scale to guide intensity level when exercising independently       Knowledge and understanding of Target Heart Rate Range (THRR) Yes       Intervention Provide education and explanation of THRR including how the numbers were predicted and where they are located for reference       Expected Outcomes Short Term: Able to state/look up THRR;Short  Term: Able to use daily as guideline for intensity in rehab;Long Term: Able to use THRR to govern intensity when exercising independently       Able to check pulse independently Yes       Intervention Provide education and demonstration on how to check pulse in carotid and radial arteries.;Review the importance of being able to check your own pulse for safety during independent exercise       Expected Outcomes Short Term: Able to explain why pulse checking is important during independent exercise;Long Term: Able to check pulse independently and accurately       Understanding of Exercise Prescription Yes       Intervention Provide education, explanation, and written materials on patient's individual exercise prescription       Expected Outcomes Short Term: Able to explain program exercise prescription;Long Term: Able to explain home exercise prescription to exercise independently                Exercise Goals Re-Evaluation :  Exercise Goals Re-Evaluation     [place] Name 06/20/21 1018 06/26/21 1529           Exercise Goal Re-Evaluation   Exercise Goals Review Increase Physical Activity;Increase Strength and Stamina;Able to understand and use rate of perceived exertion (RPE) scale;Knowledge and understanding of Target Heart Rate Range (THRR);Able to understand and use Dyspnea scale;Able to check pulse independently;Understanding of Exercise Prescription Increase Physical Activity;Increase Strength and Stamina;Understanding of Exercise Prescription      Comments Reviewed RPE and dyspnea scales, THR and program prescription with pt today.  Pt voiced understanding and was given a copy of goals to take home. Viviano has completed his first two full days of exercise.  We will continue to encourage good attendance and monitor his progress.      Expected Outcomes Short: Use RPE daily to regulate intensity. Long: Follow program prescription in THR. Short: Continue to attend rehab regularly Long: Continue to  follow prescription               Discharge Exercise Prescription (Final Exercise Prescription Changes):  Exercise Prescription Changes - 06/26/21 1500       Response to Exercise   Blood Pressure (Admit) 134/74    Blood Pressure (Exercise) 126/68    Blood Pressure (Exit) 130/68    Heart Rate (Admit) 89 bpm    Heart Rate (Exercise) 111 bpm    Heart Rate (Exit) 87 bpm    Rating of Perceived Exertion (Exercise) 12    Symptoms fatigue    Comments second full day of exercise    Duration Progress to 30 minutes of  aerobic without signs/symptoms of physical distress    Intensity THRR unchanged      Progression   Progression Continue to progress workloads to maintain intensity without signs/symptoms of physical distress.    Average METs 1.97      Resistance Training   Training Prescription Yes    Weight 3 lb    Reps 10-15      Interval Training   Interval Training No      NuStep   Level 1    Minutes 15    METs 2.5      Track   Laps 8    Minutes 15    METs 1.44             Nutrition:  Target Goals: Understanding of nutrition guidelines, daily intake of sodium 1500mg , cholesterol 200mg , calories 30% from fat and 7% or less from saturated fats, daily to have 5 or more servings of fruits and vegetables.  Education: All About Nutrition: -Group instruction provided by verbal, written material, interactive activities, discussions, models, and posters to present general guidelines for heart healthy nutrition including fat, fiber, MyPlate, the role of sodium in heart healthy nutrition, utilization of the nutrition label, and utilization of this knowledge for meal planning. Follow up email sent as well. Written material given at graduation. Flowsheet Row Cardiac Rehab from 07/04/2021 in [facility] Cardiac and Pulmonary Rehab  Date 07/04/21  Educator [facility]  Instruction Review Code 1- Verbalizes Understanding       Biometrics:  Pre Biometrics - 05/24/21 1603       Pre  Biometrics   Height 5' 9.5" (1.765 m)    Weight 190 lb (86.2 kg)    BMI (Calculated) 27.67              Nutrition Therapy Plan and Nutrition Goals:   Nutrition Assessments:  MEDIFICTS Score Key: ?70 Need to make dietary changes  40-70 Heart Healthy Diet ? 40 Therapeutic Level Cholesterol Diet  Flowsheet Row Cardiac Rehab from 05/24/2021 in [facility] Cardiac and Pulmonary Rehab  Picture Your Plate Total Score on Admission 56      Picture Your Plate Scores: <83 Unhealthy dietary pattern with much room for improvement. 41-50 Dietary pattern unlikely to meet recommendations for good health and room for improvement. 51-60 More healthful dietary pattern, with some room for improvement.  >60 Healthy dietary pattern, although there may be some specific behaviors that could be improved.  Nutrition Goals Re-Evaluation:  Nutrition Goals Re-Evaluation     [place] Name 06/27/21 1007             Goals   Current Weight 184 lb (83.5 kg)       Nutrition Goal Meet with the Dietician.       Comment Mcarthur has been going out a lot to eat since his wifes back hurts when she stands. He has set an appointment with the dietician.       Expected Outcome Short: meet with the dietician. Long: maintain a diet that pertains to him.                Nutrition Goals Discharge (Final Nutrition Goals Re-Evaluation):  Nutrition Goals Re-Evaluation - 06/27/21 1007       Goals   Current Weight 184 lb (83.5 kg)    Nutrition Goal Meet with the Dietician.    Comment Zubayr has been going out a lot to eat since his wifes back hurts when she stands. He has set an appointment with the dietician.    Expected Outcome Short: meet with the dietician. Long: maintain a diet that pertains to him.             Psychosocial: Target Goals: Acknowledge presence or absence of significant depression and/or stress, maximize coping skills, provide positive support system. Participant is able to verbalize types and  ability to use techniques and skills needed for reducing stress and depression.   Education: Stress, Anxiety, and Depression - Group verbal and visual presentation to define topics covered.  Reviews how body is impacted by stress, anxiety, and depression.  Also discusses healthy ways to reduce stress and to treat/manage anxiety and depression.  Written material given at graduation.   Education: Sleep Hygiene -Provides group verbal and written instruction about how sleep can affect your health.  Define sleep hygiene, discuss sleep cycles and impact of sleep habits. Review good sleep hygiene tips.    Initial Review & Psychosocial Screening:  Initial Psych Review & Screening - 05/14/21 1411       Initial Review   Current issues with Current Stress Concerns    Source of Stress Concerns Unable to participate in former interests or hobbies;Unable to perform yard/household activities;Chronic Illness      Family Dynamics   Good Support System? Yes   wife     Barriers   Psychosocial barriers to participate in program There are no identifiable barriers or psychosocial needs.;The patient should benefit from training in stress management and relaxation.      Screening Interventions   Interventions Encouraged to exercise;Provide feedback about the scores to participant;To provide support and resources with identified psychosocial needs;Program counselor consult    Expected Outcomes Short Term goal: Utilizing psychosocial counselor, staff and physician to assist with identification of specific Stressors or current issues interfering with healing process. Setting desired goal for each stressor or current issue identified.;Long Term Goal: Stressors or current issues are controlled or eliminated.;Short Term goal: Identification and review with participant of any Quality of Life or Depression concerns found by scoring the questionnaire.;Long Term goal: The participant improves quality of Life and PHQ9 Scores  as seen by post scores and/or verbalization of changes             Quality of Life Scores:   Quality of Life - 05/24/21 1611       Quality of Life   Select Quality of Life      Quality of Life  Scores   Health/Function Pre 23.9 %    Socioeconomic Pre 30 %    Psych/Spiritual Pre 28.29 %    Family Pre 29.5 %    GLOBAL Pre 26.79 %            Scores of 19 and below usually indicate a poorer quality of life in these areas.  A difference of  2-3 points is a clinically meaningful difference.  A difference of 2-3 points in the total score of the Quality of Life Index has been associated with significant improvement in overall quality of life, self-image, physical symptoms, and general health in studies assessing change in quality of life.  PHQ-9: Recent Review Flowsheet Data     Depression screen [facility] 2/9 05/24/2021 11/08/2020 11/08/2019   Decreased Interest 0 0 0   Down, Depressed, Hopeless 0 0 0   PHQ - 2 Score 0 0 0   Altered sleeping 0 - -   Tired, decreased energy 0 - -   Change in appetite 0 - -   Feeling bad or failure about yourself  0 - -   Trouble concentrating 0 - -   Moving slowly or fidgety/restless 0 - -   Suicidal thoughts 0 - -   PHQ-9 Score 0 - -      Interpretation of Total Score  Total Score Depression Severity:  1-4 = Minimal depression, 5-9 = Mild depression, 10-14 = Moderate depression, 15-19 = Moderately severe depression, 20-27 = Severe depression   Psychosocial Evaluation and Intervention:  Psychosocial Evaluation - 05/14/21 1416       Psychosocial Evaluation & Interventions   Interventions Encouraged to exercise with the program and follow exercise prescription;Stress management education    Comments [place] reports doing well. His hearing is a big stressor for him, so he relies on his wife's help in this area as well as managing his health care. He states he is sleeping well now that they have gotten his medication adjusted. He did have some back  issues that have caused walking difficulties at times and increased his weakness which has led him to not doing whatever he wants, so he is looking forward to working on his balance and his stamina. He is also starting radiation medication so he is a little hesitant as to how he will feel during treatment.    Expected Outcomes Short; attend Cardiac Rehab for education and Exercise. Long: develop and maintain positive self care habits.    Continue Psychosocial Services  Follow up required by staff             Psychosocial Re-Evaluation:  Psychosocial Re-Evaluation     [place] Name 06/27/21 1006             Psychosocial Re-Evaluation   Current issues with None Identified       Comments Patient reports no issues with their current mental states, sleep, stress, depression or anxiety. Will follow up with patient in a few weeks for any changes.       Expected Outcomes Short: Continue to exercise regularly to support mental health and notify staff of any changes. Long: maintain mental health and well being through teaching of rehab or prescribed medications independently.       Interventions Encouraged to attend Cardiac Rehabilitation for the exercise       Continue Psychosocial Services  Follow up required by staff                Psychosocial Discharge (Final Psychosocial  Re-Evaluation):  Psychosocial Re-Evaluation - 06/27/21 1006       Psychosocial Re-Evaluation   Current issues with None Identified    Comments Patient reports no issues with their current mental states, sleep, stress, depression or anxiety. Will follow up with patient in a few weeks for any changes.    Expected Outcomes Short: Continue to exercise regularly to support mental health and notify staff of any changes. Long: maintain mental health and well being through teaching of rehab or prescribed medications independently.    Interventions Encouraged to attend Cardiac Rehabilitation for the exercise    Continue  Psychosocial Services  Follow up required by staff             Vocational Rehabilitation: Provide vocational rehab assistance to qualifying candidates.   Vocational Rehab Evaluation & Intervention:  Vocational Rehab - 05/14/21 1411       Initial Vocational Rehab Evaluation & Intervention   Assessment shows need for Vocational Rehabilitation No             Education: Education Goals: Education classes will be provided on a variety of topics geared toward better understanding of heart health and risk factor modification. Participant will state understanding/return demonstration of topics presented as noted by education test scores.  Learning Barriers/Preferences:  Learning Barriers/Preferences - 05/14/21 1406       Learning Barriers/Preferences   Learning Barriers Hearing    Learning Preferences Individual Instruction             General Cardiac Education Topics:  AED/CPR: - Group verbal and written instruction with the use of models to demonstrate the basic use of the AED with the basic ABC's of resuscitation.   Anatomy and Cardiac Procedures: - Group verbal and visual presentation and models provide information about basic cardiac anatomy and function. Reviews the testing methods done to diagnose heart disease and the outcomes of the test results. Describes the treatment choices: Medical Management, Angioplasty, or Coronary Bypass Surgery for treating various heart conditions including Myocardial Infarction, Angina, Valve Disease, and Cardiac Arrhythmias.  Written material given at graduation. Flowsheet Row Cardiac Rehab from 07/04/2021 in [facility] Cardiac and Pulmonary Rehab  Date 06/20/21  Educator SB  Instruction Review Code 1- Verbalizes Understanding       Medication Safety: - Group verbal and visual instruction to review commonly prescribed medications for heart and lung disease. Reviews the medication, class of the drug, and side effects. Includes the steps  to properly store meds and maintain the prescription regimen.  Written material given at graduation.   Intimacy: - Group verbal instruction through game format to discuss how heart and lung disease can affect sexual intimacy. Written material given at graduation..   Know Your Numbers and Heart Failure: - Group verbal and visual instruction to discuss disease risk factors for cardiac and pulmonary disease and treatment options.  Reviews associated critical values for Overweight/Obesity, Hypertension, Cholesterol, and Diabetes.  Discusses basics of heart failure: signs/symptoms and treatments.  Introduces Heart Failure Zone chart for action plan for heart failure.  Written material given at graduation.   Infection Prevention: - Provides verbal and written material to individual with discussion of infection control including proper hand washing and proper equipment cleaning during exercise session. Flowsheet Row Cardiac Rehab from 07/04/2021 in [facility] Cardiac and Pulmonary Rehab  Date 05/24/21  Educator AS  Instruction Review Code 1- Verbalizes Understanding       Falls Prevention: - Provides verbal and written material to individual with discussion of falls  prevention and safety. Flowsheet Row Cardiac Rehab from 07/04/2021 in [facility] Cardiac and Pulmonary Rehab  Date 05/24/21  Educator AS  Instruction Review Code 1- Verbalizes Understanding       Other: -Provides group and verbal instruction on various topics (see comments)   Knowledge Questionnaire Score:   Core Components/Risk Factors/Patient Goals at Admission:  Personal Goals and Risk Factors at Admission - 05/24/21 1604       Core Components/Risk Factors/Patient Goals on Admission   Diabetes Yes    Intervention Provide education about signs/symptoms and action to take for hypo/hyperglycemia.;Provide education about proper nutrition, including hydration, and aerobic/resistive exercise prescription along with prescribed  medications to achieve blood glucose in normal ranges: Fasting glucose 65-99 mg/dL    Expected Outcomes Short Term: Participant verbalizes understanding of the signs/symptoms and immediate care of hyper/hypoglycemia, proper foot care and importance of medication, aerobic/resistive exercise and nutrition plan for blood glucose control.;Long Term: Attainment of HbA1C < 7%.    Heart Failure Yes    Intervention Provide a combined exercise and nutrition program that is supplemented with education, support and counseling about heart failure. Directed toward relieving symptoms such as shortness of breath, decreased exercise tolerance, and extremity edema.    Expected Outcomes Improve functional capacity of life;Short term: Attendance in program 2-3 days a week with increased exercise capacity. Reported lower sodium intake. Reported increased fruit and vegetable intake. Reports medication compliance.;Short term: Daily weights obtained and reported for increase. Utilizing diuretic protocols set by physician.;Long term: Adoption of self-care skills and reduction of barriers for early signs and symptoms recognition and intervention leading to self-care maintenance.    Hypertension Yes    Intervention Provide education on lifestyle modifcations including regular physical activity/exercise, weight management, moderate sodium restriction and increased consumption of fresh fruit, vegetables, and low fat dairy, alcohol moderation, and smoking cessation.;Monitor prescription use compliance.    Expected Outcomes Short Term: Continued assessment and intervention until BP is < 140/24mm HG in hypertensive participants. < 130/40mm HG in hypertensive participants with diabetes, heart failure or chronic kidney disease.;Long Term: Maintenance of blood pressure at goal levels.             Education:Diabetes - Individual verbal and written instruction to review signs/symptoms of diabetes, desired ranges of glucose level  fasting, after meals and with exercise. Acknowledge that pre and post exercise glucose checks will be done for 3 sessions at entry of program. [place] from 05/14/2021 in [facility] Cardiac and Pulmonary Rehab  Date 05/14/21  Educator [facility]  Instruction Review Code 1- Verbalizes Understanding       Core Components/Risk Factors/Patient Goals Review:   Goals and Risk Factor Review     Row Name 06/27/21 1003             Core Components/Risk Factors/Patient Goals Review   Personal Goals Review Diabetes;Weight Management/Obesity       Review Bryant has been checking his sugar at home and has been in the 130s. He has had most of his blood sugar checks at [facility] and they have been within normal ranges. He would like to lose a little bit of weight in the program. He would like to reach a weight goal of 170 pounds.       Expected Outcomes Short: lose some weight. Long: reach a weight goal of 170 pounds.                Core Components/Risk Factors/Patient Goals at Discharge (Final Review):   Goals  and Risk Factor Review - 06/27/21 1003       Core Components/Risk Factors/Patient Goals Review   Personal Goals Review Diabetes;Weight Management/Obesity    Review Dan has been checking his sugar at home and has been in the 130s. He has had most of his blood sugar checks at [facility] and they have been within normal ranges. He would like to lose a little bit of weight in the program. He would like to reach a weight goal of 170 pounds.    Expected Outcomes Short: lose some weight. Long: reach a weight goal of 170 pounds.             ITP Comments:  ITP Comments     Row Name 05/14/21 1404 05/24/21 1617 06/06/21 0750 06/20/21 1017 07/04/21 1200   ITP Comments Initial telephone orientation completed. Diagnosis can be found in [facility] 5/2. EP orientation scheduled for Thursday 6/2 at 2pm. Completed 6MWT and gym orientation. Initial ITP created and sent for review to Dr. Emily Filbert,  Medical Director. 30 Day review completed. Medical Director ITP review done, changes made as directed, and signed approval by Medical Director.  new to program First full day of exercise!  Patient was oriented to gym and equipment including functions, settings, policies, and procedures.  Patient's individual exercise prescription and treatment plan were reviewed.  All starting workloads were established based on the results of the 6 minute walk test done at initial orientation visit.  The plan for exercise progression was also introduced and progression will be customized based on patient's performance and goals. 30 Day review completed. Medical Director ITP review done, changes made as directed, and signed approval by Medical Director.            Comments:

## 2021-07-04 NOTE — Telephone Encounter (Signed)
I spoke with patient's son. Patient started having diarrhea today. No other GI symptoms including N/V or abd pain. No fever/chills. Suspect diarrhea is likely from abx. Recommend supportive care with antidiarrheals, probiotic, and increased PO fluids. Would recommend checking stool cultures and C. Difficile PCR if symptoms persist. I also suggested scheduling follow up with PCP in the near future. We can also see him back in [facility] if needed.

## 2021-07-04 NOTE — Progress Notes (Signed)
Daily Session Note  Patient Details  Name: Howard [name] MRN: [id number] Date of Birth: 1931/03/16 Referring Provider:   Flowsheet Row Cardiac Rehab from 05/24/2021 in [facility] Cardiac and Pulmonary Rehab  Referring Provider Gollan       Encounter Date: 07/04/2021  Check In:  Session Check In - 07/04/21 1029       Check-In   Supervising physician immediately available to respond to emergencies See telemetry face sheet for immediately available ER MD    Location [facility]-Cardiac & Pulmonary Rehab    Staff Present Heath Lark, RN, BSN, CCRP;Melissa Pollock, RDN, LDN;Meredith Sherryll Burger, RN BSN;Jessica Luan Pulling, MA, RCEP, CCRP, CCET    Virtual Visit No    Medication changes reported     No    Warm-up and Cool-down Performed on first and last piece of equipment    Resistance Training Performed Yes    VAD Patient? No    PAD/SET Patient? No                Social History   Tobacco Use  Smoking Status Former   Packs/day: 1.00   Years: 56.00   Pack years: 56.00   Types: Cigarettes   Quit date: 2011   Years since quitting: 11.5  Smokeless Tobacco Never    Goals Met:  Independence with exercise equipment Exercise tolerated well No report of cardiac concerns or symptoms  Goals Unmet:  Not Applicable  Comments: Pt able to follow exercise prescription today without complaint.  Will continue to monitor for progression.    Dr. Emily Filbert is Medical Director for [place].  Dr. Ottie Glazier is Medical Director for [facility] Pulmonary Rehabilitation.

## 2021-07-06 ENCOUNTER — Other Ambulatory Visit: Payer: Self-pay

## 2021-07-06 ENCOUNTER — Ambulatory Visit (INDEPENDENT_AMBULATORY_CARE_PROVIDER_SITE_OTHER): Payer: Medicare Other

## 2021-07-06 ENCOUNTER — Encounter: Payer: Medicare Other | Admitting: *Deleted

## 2021-07-06 DIAGNOSIS — I5042 Chronic combined systolic (congestive) and diastolic (congestive) heart failure: Secondary | ICD-10-CM

## 2021-07-06 DIAGNOSIS — I428 Other cardiomyopathies: Secondary | ICD-10-CM

## 2021-07-06 DIAGNOSIS — Z79899 Other long term (current) drug therapy: Secondary | ICD-10-CM | POA: Diagnosis not present

## 2021-07-06 NOTE — Progress Notes (Signed)
Daily Session Note  Patient Details  Name: Howard [name] MRN: [id number] Date of Birth: [birth year] Referring Provider:   Flowsheet Row Cardiac Rehab from 05/24/2021 in [facility] Cardiac and Pulmonary Rehab  Referring Provider Gollan       Encounter Date: 07/06/2021  Check In:  Session Check In - 07/06/21 0933       Check-In   Supervising physician immediately available to respond to emergencies See telemetry face sheet for immediately available ER MD    Location [facility]-Cardiac & Pulmonary Rehab    Staff Present Renita Papa, RN BSN;Joseph Tessie Fass, RCP,RRT,BSRT;Melissa [place], [place], LDN    Virtual Visit No    Medication changes reported     No    Fall or balance concerns reported    No    Warm-up and Cool-down Performed on first and last piece of equipment    Resistance Training Performed Yes    VAD Patient? No    PAD/SET Patient? No      Pain Assessment   Currently in Pain? No/denies                Social History   Tobacco Use  Smoking Status Former   Packs/day: 1.00   Years: 56.00   Pack years: 56.00   Types: Cigarettes   Quit date: 2011   Years since quitting: 11.5  Smokeless Tobacco Never    Goals Met:  Independence with exercise equipment Exercise tolerated well No report of cardiac concerns or symptoms Strength training completed today  Goals Unmet:  Not Applicable  Comments: Pt able to follow exercise prescription today without complaint.  Will continue to monitor for progression.    Dr. Emily Filbert is Medical Director for [place].  Dr. Ottie Glazier is Medical Director for [facility] Pulmonary Rehabilitation.

## 2021-07-07 LAB — CUP PACEART REMOTE DEVICE CHECK
Battery Remaining Longevity: 91 mo
Battery Remaining Percentage: 82 %
Battery Voltage: 3.01 V
Brady Statistic AP VP Percent: 1 %
Brady Statistic AP VS Percent: 1 %
Brady Statistic AS VP Percent: 99 %
Brady Statistic AS VS Percent: 1 %
Brady Statistic RA Percent Paced: 1 %
Brady Statistic RV Percent Paced: 99 %
Date Time Interrogation Session: 20220715020012
Implantable Lead Implant Date: 20111208
Implantable Lead Implant Date: 20111208
Implantable Lead Location: 753859
Implantable Lead Location: 753860
Implantable Lead Model: 1948
Implantable Pulse Generator Implant Date: 20200717
Lead Channel Impedance Value: 380 Ohm
Lead Channel Impedance Value: 400 Ohm
Lead Channel Pacing Threshold Amplitude: 0.75 V
Lead Channel Pacing Threshold Amplitude: 1.25 V
Lead Channel Pacing Threshold Pulse Width: 0.5 ms
Lead Channel Pacing Threshold Pulse Width: 0.5 ms
Lead Channel Sensing Intrinsic Amplitude: 12 mV
Lead Channel Sensing Intrinsic Amplitude: 2.9 mV
Lead Channel Setting Pacing Amplitude: 1.5 V
Lead Channel Setting Pacing Amplitude: 2 V
Lead Channel Setting Pacing Pulse Width: 0.5 ms
Lead Channel Setting Sensing Sensitivity: 4 mV
Pulse Gen Model: 2272
Pulse Gen Serial Number: 9150507

## 2021-07-11 ENCOUNTER — Other Ambulatory Visit: Payer: Self-pay

## 2021-07-11 DIAGNOSIS — Z79899 Other long term (current) drug therapy: Secondary | ICD-10-CM | POA: Diagnosis not present

## 2021-07-11 DIAGNOSIS — I5042 Chronic combined systolic (congestive) and diastolic (congestive) heart failure: Secondary | ICD-10-CM | POA: Diagnosis not present

## 2021-07-11 NOTE — Progress Notes (Signed)
Daily Session Note  Patient Details  Name: Howard [name] MRN: [id number] Date of Birth: 12-19-1931 Referring Provider:   Flowsheet Row Cardiac Rehab from 05/24/2021 in [facility] Cardiac and Pulmonary Rehab  Referring Provider Gollan       Encounter Date: 07/11/2021  Check In:  Session Check In - 07/11/21 [place]       Check-In   Supervising physician immediately available to respond to emergencies See telemetry face sheet for immediately available ER MD    Location [facility]-Cardiac & Pulmonary Rehab    Staff Present Birdie Sons, MPA, Elveria Rising, BA, ACSM CEP, Exercise Physiologist;Joseph Tessie Fass, [place]    Virtual Visit No    Medication changes reported     No    Fall or balance concerns reported    No    Warm-up and Cool-down Performed on first and last piece of equipment    Resistance Training Performed Yes    VAD Patient? No    PAD/SET Patient? No      Pain Assessment   Currently in Pain? No/denies                Social History   Tobacco Use  Smoking Status Former   Packs/day: 1.00   Years: 56.00   Pack years: 56.00   Types: Cigarettes   Quit date: 2011   Years since quitting: 11.5  Smokeless Tobacco Never    Goals Met:  Independence with exercise equipment Exercise tolerated well No report of cardiac concerns or symptoms Strength training completed today  Goals Unmet:  Not Applicable  Comments: Pt able to follow exercise prescription today without complaint.  Will continue to monitor for progression.    Dr. Emily Filbert is Medical Director for [place].  Dr. Ottie Glazier is Medical Director for [facility] Pulmonary Rehabilitation.

## 2021-07-13 ENCOUNTER — Telehealth: Payer: Self-pay | Admitting: Physician Assistant

## 2021-07-13 ENCOUNTER — Other Ambulatory Visit: Payer: Self-pay

## 2021-07-13 ENCOUNTER — Encounter: Payer: Medicare Other | Admitting: *Deleted

## 2021-07-13 DIAGNOSIS — Z79899 Other long term (current) drug therapy: Secondary | ICD-10-CM | POA: Diagnosis not present

## 2021-07-13 DIAGNOSIS — I5042 Chronic combined systolic (congestive) and diastolic (congestive) heart failure: Secondary | ICD-10-CM

## 2021-07-13 NOTE — Telephone Encounter (Signed)
*  STAT* If patient is at the pharmacy, call can be transferred to refill team.   1. Which medications need to be refilled? (please list name of each medication and dose if known) Entresto 49-51  2. Which pharmacy/location (including street and city if local pharmacy) is medication to be sent to? [facility]  3. Do they need a 30 day or 90 day supply? 90  Previous ordered on 6/13 but has no refills and Pharmacy wants to get refills prepared for when he needs medication   If needing to call back, [phone] opt 2 Ref # [id number]

## 2021-07-13 NOTE — Telephone Encounter (Signed)
90 day refill sent to [facility] on 06/04/21. Can refill again after echo at upcoming appointment in August for next fill.

## 2021-07-13 NOTE — Progress Notes (Signed)
Daily Session Note  Patient Details  Name: Howard [name] MRN: [id number] Date of Birth: 10/28/1931 Referring Provider:   Flowsheet Row Cardiac Rehab from 05/24/2021 in [facility] Cardiac and Pulmonary Rehab  Referring Provider Gollan       Encounter Date: 07/13/2021  Check In:  Session Check In - 07/13/21 1219       Check-In   Supervising physician immediately available to respond to emergencies See telemetry face sheet for immediately available ER MD    Location [facility]-Cardiac & Pulmonary Rehab    Staff Present Renita Papa, RN BSN;Joseph [place], RCP,RRT,BSRT;Jessica [place], [place], RCEP, CCRP, CCET    Virtual Visit No    Medication changes reported     No    Fall or balance concerns reported    No    Warm-up and Cool-down Performed on first and last piece of equipment    Resistance Training Performed Yes    VAD Patient? No    PAD/SET Patient? No      Pain Assessment   Currently in Pain? No/denies                Social History   Tobacco Use  Smoking Status Former   Packs/day: 1.00   Years: 56.00   Pack years: 56.00   Types: Cigarettes   Quit date: 2011   Years since quitting: 11.5  Smokeless Tobacco Never    Goals Met:  Independence with exercise equipment Exercise tolerated well No report of cardiac concerns or symptoms Strength training completed today  Goals Unmet:  Not Applicable  Comments: Pt able to follow exercise prescription today without complaint.  Will continue to monitor for progression.    Dr. Emily Filbert is Medical Director for [place].  Dr. Ottie Glazier is Medical Director for [facility] Pulmonary Rehabilitation.

## 2021-07-16 ENCOUNTER — Other Ambulatory Visit: Payer: Self-pay

## 2021-07-16 ENCOUNTER — Encounter: Payer: Medicare Other | Admitting: *Deleted

## 2021-07-16 DIAGNOSIS — I5042 Chronic combined systolic (congestive) and diastolic (congestive) heart failure: Secondary | ICD-10-CM

## 2021-07-16 DIAGNOSIS — Z79899 Other long term (current) drug therapy: Secondary | ICD-10-CM | POA: Diagnosis not present

## 2021-07-16 NOTE — Progress Notes (Signed)
Daily Session Note  Patient Details  Name: SHADEN LACHER MRN: [id number] Date of Birth: 08-03-1931 Referring Provider:   Flowsheet Row Cardiac Rehab from 05/24/2021 in [facility] Cardiac and Pulmonary Rehab  Referring Provider Gollan       Encounter Date: 07/16/2021  Check In:  Session Check In - 07/16/21 1024       Check-In   Supervising physician immediately available to respond to emergencies See telemetry face sheet for immediately available ER MD    Location [facility]-Cardiac & Pulmonary Rehab    Staff Present Heath Lark, RN, BSN, Laveda Norman, BS, ACSM CEP, Exercise Physiologist;Joseph [place], [place]    Virtual Visit No    Medication changes reported     No    Fall or balance concerns reported    No    Warm-up and Cool-down Performed on first and last piece of equipment    Resistance Training Performed Yes    VAD Patient? No    PAD/SET Patient? No      Pain Assessment   Currently in Pain? No/denies                Social History   Tobacco Use  Smoking Status Former   Packs/day: 1.00   Years: 56.00   Pack years: 56.00   Types: Cigarettes   Quit date: 2011   Years since quitting: 11.5  Smokeless Tobacco Never    Goals Met:  Independence with exercise equipment Exercise tolerated well No report of cardiac concerns or symptoms  Goals Unmet:  Not Applicable  Comments: Pt able to follow exercise prescription today without complaint.  Will continue to monitor for progression.    Dr. Emily Filbert is Medical Director for [place].  Dr. Ottie Glazier is Medical Director for [facility] Pulmonary Rehabilitation.

## 2021-07-18 ENCOUNTER — Ambulatory Visit
Admission: RE | Admit: 2021-07-18 | Discharge: 2021-07-18 | Disposition: A | Discharge status: Home / Self Care | Payer: Medicare Other | Source: Ambulatory Visit | Attending: Radiation Oncology | Admitting: Radiation Oncology

## 2021-07-18 ENCOUNTER — Ambulatory Visit: Payer: Medicare Other | Admitting: Oncology

## 2021-07-18 ENCOUNTER — Other Ambulatory Visit: Payer: Self-pay

## 2021-07-18 ENCOUNTER — Encounter: Payer: Self-pay | Admitting: Oncology

## 2021-07-18 ENCOUNTER — Encounter: Payer: Self-pay | Admitting: Radiation Oncology

## 2021-07-18 ENCOUNTER — Inpatient Hospital Stay (HOSPITAL_BASED_OUTPATIENT_CLINIC_OR_DEPARTMENT_OTHER): Payer: Medicare Other | Admitting: Oncology

## 2021-07-18 ENCOUNTER — Inpatient Hospital Stay: Payer: Medicare Other

## 2021-07-18 VITALS — BP 97/48 | HR 79 | Resp 18 | Wt 179.0 lb

## 2021-07-18 VITALS — BP 109/54 | HR 79 | Temp 98.6°F | Resp 18 | Wt 180.0 lb

## 2021-07-18 DIAGNOSIS — I428 Other cardiomyopathies: Secondary | ICD-10-CM | POA: Diagnosis not present

## 2021-07-18 DIAGNOSIS — J069 Acute upper respiratory infection, unspecified: Secondary | ICD-10-CM | POA: Diagnosis not present

## 2021-07-18 DIAGNOSIS — D509 Iron deficiency anemia, unspecified: Secondary | ICD-10-CM

## 2021-07-18 DIAGNOSIS — Z7189 Other specified counseling: Secondary | ICD-10-CM

## 2021-07-18 DIAGNOSIS — I5022 Chronic systolic (congestive) heart failure: Secondary | ICD-10-CM | POA: Diagnosis not present

## 2021-07-18 DIAGNOSIS — C3411 Malignant neoplasm of upper lobe, right bronchus or lung: Secondary | ICD-10-CM | POA: Insufficient documentation

## 2021-07-18 DIAGNOSIS — C3491 Malignant neoplasm of unspecified part of right bronchus or lung: Secondary | ICD-10-CM

## 2021-07-18 DIAGNOSIS — C3490 Malignant neoplasm of unspecified part of unspecified bronchus or lung: Secondary | ICD-10-CM

## 2021-07-18 DIAGNOSIS — R634 Abnormal weight loss: Secondary | ICD-10-CM | POA: Insufficient documentation

## 2021-07-18 DIAGNOSIS — J449 Chronic obstructive pulmonary disease, unspecified: Secondary | ICD-10-CM | POA: Diagnosis not present

## 2021-07-18 DIAGNOSIS — R0602 Shortness of breath: Secondary | ICD-10-CM

## 2021-07-18 DIAGNOSIS — Z923 Personal history of irradiation: Secondary | ICD-10-CM | POA: Insufficient documentation

## 2021-07-18 DIAGNOSIS — G4733 Obstructive sleep apnea (adult) (pediatric): Secondary | ICD-10-CM | POA: Diagnosis not present

## 2021-07-18 MED ORDER — MEGESTROL ACETATE 40 MG PO TABS: 80.0000 mg | ORAL_TABLET | Freq: Two times a day (BID) | ORAL | 0 refills | Status: DC

## 2021-07-18 NOTE — Progress Notes (Signed)
Hematology/Oncology follow up note [facility] Telephone:([phone] Fax:(336) [phone]   Patient Care Team: Chrismon, [name], PA-C as PCP - General (Family Medicine) [name], MD as PCP - Cardiology (Cardiology) Deboraha Sprang, MD as PCP - Electrophysiology (Cardiology) Dasher, [name], MD as Consulting Physician (Dermatology) Birder Robson, MD as Referring Physician (Ophthalmology) Lucky Cowboy Erskine Squibb, MD as Referring Physician (Vascular Surgery) Carloyn Manner, MD as Referring Physician (Otolaryngology) Noreene Filbert, MD as Referring Physician (Radiation Oncology) Erby Pian, MD as Referring Physician (Specialist)  REFERRING PROVIDER: Margo Common, PA-C  CHIEF COMPLAINTS/REASON FOR VISIT:  Follow up for lung cancer  HISTORY OF PRESENTING ILLNESS:   Howard [name] is a  87 y.o.  male with PMH listed below was seen in consultation at the request of  Chrismon, [name], PA-C  for evaluation of lung cancer Patient was accompanied by his son. 10/07/2015 CT chest without contrast showed suspicious anterior right upper lobe spiculated nodule.  1.3 cm 10/26/2015, right upper lobe spiculated lung mass biopsy-adenocarcinoma with acinar pattern. December 2016,-SBRT to the right upper lobe Patient follows up with Dr. Baruch Gouty for post monitoring CT surveillance. 06/05/2020, CT chest with contrast showed enlarging irregular solid right lower lobe 1.6 cm pulmonary nodule.  Worrisome for malignancy. 06/19/2020, PET scan showed pleural-based nodule with central calcification noted to be enlarging at the recent CT and the maximum of SUV of 2.7.  Low-grade malignancy is difficult to exclude. 09/27/2020, CT chest with contrast showed no significant changes in the appearance of the solid right lower lobe pulmonary lesion. 04/02/2021, CT chest with contrast showed interval enlargement of the spiculated nodule of the right lower lobe, 2.5 x 2.1 cm,  previously 1.7 x 1.3 cm.  Interval enlargement of right hilar, subcarinal, pretracheal and S2.  Right paratracheal lymph nodes.  Largest subcarinal node measuring 4 x 2.2 cm coronary artery disease 04/12/2021, 2.7 x 2.0 cm hypermetabolic right lower lobe pulmonary nodule, with bilateral mediastinal lymphadenopathy.  No definitive signs of remote metastatic disease.  Concerning for primary bronchogenic carcinoma.  T1c N3 M0.-Aortic atherosclerosis injection to left main/three-vessel coronary artery disease.  Calcification of aortic valve and mitral valve/annulus.  Patient was referred by Dr. Baruch Gouty to establish care with oncology. Patient lives at home with his wife.  He walks with a cane. He sees pulmonology Dr. Patsey Berthold for COPD, OSA on CPAP. Appetite is fair.  No intentional weight loss  chronic shortness of breath with exertion   INTERVAL HISTORY Howard [name] is a 87 y.o. male who has above history reviewed by me today presents for follow up visit for management for presumed stage III lung cancer.   S/p Radiation for treatment of lung cancer. Chemotherapy was not offered due to his poor performance status and multiple comorbidities.   Patient was accompanied by his wife today. He has multiple complaints. Has developed loose bowel movements recently, he has not had any today.  Fatigue, no appetite and has lost 13 pounds since his last visit with me. Also more shortness of breath, decreased exercise endurance, can not walk on heart track. No fever, chills, nausea vomiting, dysphagia.  He has repeat echo scheduled and has upcoming appt with cardiology  Review of Systems  Constitutional:  Positive for fatigue. Negative for appetite change, chills, fever and unexpected weight change.  HENT:   Negative for hearing loss and voice change.   Eyes:  Negative for eye problems and icterus.  Respiratory:  Positive for shortness of  breath. Negative for chest tightness and cough.   Cardiovascular:   Negative for chest pain and leg swelling.  Gastrointestinal:  Negative for abdominal distention and abdominal pain.  Endocrine: Negative for hot flashes.  Genitourinary:  Negative for difficulty urinating, dysuria and frequency.   Musculoskeletal:  Negative for arthralgias.  Skin:  Negative for itching and rash.  Neurological:  Negative for light-headedness and numbness.  Hematological:  Negative for adenopathy. Does not bruise/bleed easily.  Psychiatric/Behavioral:  Negative for confusion.    MEDICAL HISTORY:  Past Medical History:  Diagnosis Date   Arthritis    Bell palsy    Bell's palsy 04/12/2015   [facility])    prostate and skin   Chronic combined systolic and diastolic CHF, NYHA class 1 ([place])    a. 07/2014 Echo: EF 35-40%, Gr 1 DD.   Complete heart block ([place])    a. 11/2010 s/p SJM 2210 [facility] DC PPM, ser# [id number].   Depression    Diabetes mellitus without complication ([place])    Fall 11-10-14   GERD (gastroesophageal reflux disease)    History of prostate cancer    Hyperlipidemia    Hypertension    LBBB (left bundle branch block)    Left-sided Bell's palsy    Lung cancer ([place]) 2016   NICM (nonischemic cardiomyopathy) ([place])    a. 07/2014 Echo: EF 35-40%, mid-apicalanteroseptal DK, Gr 1 DD, mild-mod dil LA.   Non-obstructive CAD    a. 07/2014 Abnl MV;  b. 08/2014 Cath: LM nl, LAD 30p, RI 40p, LCX nl, OM1 40, RCA dominant 30p, 70d-->Med Rx.   Poor balance    Presence of permanent cardiac pacemaker    Sleep apnea    a. cpap   Vertigo    WPW (Wolff-Parkinson-White syndrome)    a. S/P RFCA 1991.    SURGICAL HISTORY: Past Surgical History:  Procedure Laterality Date   ABDOMINAL AORTIC ENDOVASCULAR STENT GRAFT  08/25/2019   Procedure: ABDOMINAL AORTIC ENDOVASCULAR STENT GRAFT;  Surgeon: Algernon Huxley, MD;  Location: [facility] ORS;  Service: Vascular;;   ANGIOPLASTY Left 08/25/2019   Procedure: ANGIOPLASTY;  Surgeon: Algernon Huxley, MD;  Location: [facility] OR[facility];  Service: Vascular;   Laterality: Left;  left SFA and stent placement   APPLICATION OF WOUND VAC Left 06/07/2015   Procedure: APPLICATION OF WOUND VAC;  Surgeon: Robert Bellow, MD;  Location: [facility] ORS;  Service: General;  Laterality: Left;  left upper back   [place]  08/26/2014   Single vessel obstructive CAD   CARPAL TUNNEL RELEASE  04-04-15   [facility]   CATARACT EXTRACTION  07-31-11 and 09-18-11   Catheter ablation  1991   for WPW   cervical fusion     CHOLECYSTECTOMY  09-07-14   ENDARTERECTOMY FEMORAL Left 08/25/2019   Procedure: ENDARTERECTOMY FEMORAL;  Surgeon: Algernon Huxley, MD;  Location: [facility] OR[facility];  Service: Vascular;  Laterality: Left;  common and produndis    ENDOVASCULAR REPAIR/STENT GRAFT Right 08/25/2019   Procedure: ENDOVASCULAR REPAIR/STENT GRAFT;  Surgeon: Algernon Huxley, MD;  Location: [facility];  Service: Vascular;  Laterality: Right;  renal artery   HAND SURGERY     right 1993; left 2005   [place] / REPLACE / REMOVE PACEMAKER     INSERTION OF ILIAC STENT Bilateral 08/25/2019   Procedure: INSERTION OF ILIAC STENT;  Surgeon: Algernon Huxley, MD;  Location: [facility] ORS;  Service: Vascular;  Laterality: Bilateral;   JOINT REPLACEMENT Left 2013   knee   JOINT REPLACEMENT Right 2004   knee   KNEE SURGERY     left knee 1991 and 1992; right knee 1995   LEFT HEART CATHETERIZATION WITH CORONARY ANGIOGRAM N/A 08/26/2014   Procedure: LEFT HEART CATHETERIZATION WITH CORONARY ANGIOGRAM;  Surgeon: [name], MD;  Location: [facility] CATH LAB;  Service: Cardiovascular;  Laterality: N/A;   LOWER EXTREMITY ANGIOGRAPHY Left 08/23/2019   Procedure: Lower Extremity Angiography;  Surgeon: Algernon Huxley, MD;  Location: [place] CV LAB;  Service: Cardiovascular;  Laterality: Left;   LUMBAR LAMINECTOMY/DECOMPRESSION MICRODISCECTOMY N/A 06/07/2014   Procedure: LUMBAR FOUR TO FIVE LUMBAR LAMINECTOMY/DECOMPRESSION MICRODISCECTOMY 1 LEVEL;  Surgeon: [name], MD;  Location:  [place] NEURO ORS;  Service: Neurosurgery;  Laterality: N/A;   LUNG BIOPSY Right 2016   Dr [name]   MOHS SURGERY     PACEMAKER INSERTION     PPM-- St Jude 11/30/10 by [name]   PPM GENERATOR CHANGEOUT N/A 07/09/2019   Procedure: PPM GENERATOR CHANGEOUT;  Surgeon: Deboraha Sprang, MD;  Location: [place] CV LAB;  Service: Cardiovascular;  Laterality: N/A;   PROSTATE SURGERY     cancer--1998, prostatectomy   REPLACEMENT TOTAL KNEE     2004   RIGHT/LEFT HEART CATH AND CORONARY ANGIOGRAPHY Bilateral 04/13/2021   Procedure: RIGHT/LEFT HEART CATH AND CORONARY ANGIOGRAPHY;  Surgeon: Wellington Hampshire, MD;  Location: [place] CV LAB;  Service: Cardiovascular;  Laterality: Bilateral;   ruptured disc     1962 and 1998   TEE WITHOUT CARDIOVERSION N/A 09/01/2019   Procedure: TRANSESOPHAGEAL ECHOCARDIOGRAM (TEE);  Surgeon: [name], MD;  Location: [facility] ORS;  Service: Cardiovascular;  Laterality: N/A;   TEMPORARY PACEMAKER N/A 07/09/2019   Procedure: TEMPORARY PACEMAKER;  Surgeon: Deboraha Sprang, MD;  Location: [place] CV LAB;  Service: Cardiovascular;  Laterality: N/A;   TRIGGER FINGER RELEASE  01-24-15   WOUND DEBRIDEMENT Left 06/07/2015   Procedure: DEBRIDEMENT WOUND;  Surgeon: Robert Bellow, MD;  Location: [facility] ORS;  Service: General;  Laterality: Left;  left upper back    SOCIAL HISTORY: Social History   Socioeconomic History   Marital status: Married    Spouse name: Not on file   Number of children: 2   Years of education: College   Highest education level: Some college, no degree  Occupational History   Occupation: Retired  Tobacco Use   Smoking status: Former    Packs/day: 1.00    Years: 56.00    Pack years: 56.00    Types: Cigarettes    Quit date: 2011    Years since quitting: 11.5   Smokeless tobacco: Never  Vaping Use   Vaping Use: Never used  Substance and Sexual Activity   Alcohol use: No   Drug use: No   Sexual activity: Not on file  Other Topics Concern   Not  on file  Social History Narrative   Drinks 2 cups of coffee a day    Social Determinants of Radio broadcast assistant Strain: Low Risk    Difficulty of Paying Living Expenses: Not hard at all  Food Insecurity: No Food Insecurity   Worried About Charity fundraiser in the Last Year: Never true   Arboriculturist in the Last Year: Never true  Transportation Needs: No Transportation Needs   Lack of Transportation (Medical): No   Lack of Transportation (Non-Medical): No  Physical Activity: Inactive  Days of Exercise per Week: 0 days   Minutes of Exercise per Session: 0 min  Stress: No Stress Concern Present   Feeling of Stress : Not at all  Social Connections: Moderately Isolated   Frequency of Communication with Friends and Family: Twice a week   Frequency of Social Gatherings with Friends and Family: Twice a week   Attends Religious Services: Never   Printmaker: No   Attends Music therapist: Never   Marital Status: Married  Human resources officer Violence: Not At Risk   Fear of Current or Ex-Partner: No   Emotionally Abused: No   Physically Abused: No   Sexually Abused: No    FAMILY HISTORY: Family History  Problem Relation Age of Onset   Heart attack Mother    Hyperlipidemia Mother    CAD Other    Prostate cancer Neg Hx    Cancer Neg Hx     ALLERGIES:  is allergic to sulfa antibiotics.  MEDICATIONS:  Current Outpatient Medications  Medication Sig Dispense Refill   acetaminophen (TYLENOL) 500 MG tablet Take 1,000 mg by mouth every 6 (six) hours as needed for moderate pain.     albuterol (VENTOLIN HFA) 108 (90 Base) MCG/ACT inhaler Inhale 2 puffs into the lungs every 6 (six) hours as needed for wheezing or shortness of breath. 8 g 2   aspirin EC 81 MG tablet Take 81 mg by mouth daily.     cetirizine (ZYRTEC) 10 MG tablet Take 10 mg by mouth daily.     Cholecalciferol (VITAMIN D3) 1000 UNITS CAPS Take 1,000 Units by mouth daily.      donepezil (ARICEPT) 5 MG tablet Take 5 mg by mouth daily.     ELIQUIS 5 MG TABS tablet TAKE 1 TABLET TWICE A DAY  (SWITCHED FROM PLAVIX) 60 tablet 11   ezetimibe (ZETIA) 10 MG tablet TAKE 1 TABLET DAILY 90 tablet 2   Fluticasone-Umeclidin-Vilant (TRELEGY ELLIPTA) 100-62.5-25 MCG/INH AEPB Inhale 1 puff into the lungs daily. 180 each 3   furosemide (LASIX) 20 MG tablet Take 1 tablet (20 mg total) by mouth daily. 30 tablet 5   Iron-Vitamin C (VITRON-C) 65-125 MG TABS Take 1 tablet by mouth daily. 30 tablet 2   isosorbide mononitrate (IMDUR) 30 MG 24 hr tablet TAKE 1 TABLET DAILY 90 tablet 2   megestrol (MEGACE) 40 MG tablet Take 2 tablets (80 mg total) by mouth 2 (two) times daily. 120 tablet 0   metFORMIN (GLUCOPHAGE) 500 MG tablet Take 1 tablet (500 mg total) by mouth 2 (two) times daily with a meal. 180 tablet 1   metoprolol succinate (TOPROL-XL) 25 MG 24 hr tablet TAKE 1 TABLET AT BEDTIME 90 tablet 2   Multiple Vitamin (MULTIVITAMIN WITH MINERALS) TABS tablet Take 1 tablet by mouth daily. One-A-Day Multivitamin     omeprazole (PRILOSEC) 40 MG capsule TAKE 1 CAPSULE TWICE DAILY AS NEEDED 180 capsule 3   sacubitril-valsartan (ENTRESTO) 49-51 MG Take 1 tablet by mouth 2 (two) times daily. 180 tablet 0   simvastatin (ZOCOR) 40 MG tablet TAKE 1 TABLET AT BEDTIME 90 tablet 3   spironolactone (ALDACTONE) 25 MG tablet Take 1 tablet (25 mg total) by mouth daily. 90 tablet 3   montelukast (SINGULAIR) 10 MG tablet Take 10 mg by mouth daily as needed (sneezing/allergies.). (Patient not taking: Reported on 07/18/2021)     promethazine-dextromethorphan (PROMETHAZINE-DM) 6.25-15 MG/5ML syrup Take 5 mLs by mouth 3 (three) times daily as needed for cough. (  Patient not taking: Reported on 07/18/2021) 118 mL 0   No current facility-administered medications for this visit.     PHYSICAL EXAMINATION: ECOG PERFORMANCE STATUS: 1 - Symptomatic but completely ambulatory Vitals:   07/18/21 1408  BP: (!) 109/54   Pulse: 79  Resp: 18  Temp: 98.6 F (37 C)  SpO2: 92%   Filed Weights   07/18/21 1408  Weight: 180 lb (81.6 kg)    Physical Exam Constitutional:      General: He is not in acute distress.    Comments: Patient sits in the wheelchair today.    HENT:     Head: Normocephalic and atraumatic.  Eyes:     General: No scleral icterus. Cardiovascular:     Rate and Rhythm: Normal rate and regular rhythm.     Heart sounds: Normal heart sounds.  Pulmonary:     Effort: Pulmonary effort is normal. No respiratory distress.     Breath sounds: No wheezing.  Abdominal:     General: Bowel sounds are normal. There is no distension.     Palpations: Abdomen is soft.  Musculoskeletal:        General: No deformity. Normal range of motion.     Cervical back: Normal range of motion and neck supple.  Skin:    General: Skin is warm and dry.     Findings: No erythema or rash.  Neurological:     Mental Status: He is alert and oriented to person, place, and time. Mental status is at baseline.  Psychiatric:        Mood and Affect: Mood normal.    LABORATORY DATA:  I have reviewed the data as listed Lab Results  Component Value Date   WBC 4.9 07/03/2021   HGB 11.2 (L) 07/03/2021   HCT 34.7 (L) 07/03/2021   MCV 82.4 07/03/2021   PLT 210 07/03/2021   Recent Labs    11/13/20 0833 04/09/21 1011 04/16/21 1149 05/15/21 1005 05/22/21 1023 07/03/21 1356  NA 139   < >  --  139 138 134*  K 4.6   < >  --  4.0 3.9 4.1  CL 104   < >  --  107 108 102  CO2 19*   < >  --  19* 19* 21*  GLUCOSE 131*   < >  --  238* 128* 106*  BUN 23   < >  --  28* 25* 22  CREATININE 1.24   < >  --  1.34* 1.24 1.20  CALCIUM 9.4   < >  --  9.1 8.7* 8.9  GFRNONAA 52*   < >  --  51* 56* 58*  GFRAA 60  --   --   --   --   --   PROT 6.7  --  6.9  --   --  7.3  ALBUMIN 4.2  --  3.5  --   --  3.8  AST 16  --  22  --   --  23  ALT 10  --  13  --   --  12  ALKPHOS 54  --  48  --   --  56  BILITOT 0.5  --  0.8  --   --   0.8  BILIDIR  --   --  0.1  --   --   --   IBILI  --   --  0.7  --   --   --    < > =  values in this interval not displayed.    Iron/TIBC/Ferritin/ %Sat    Component Value Date/Time   IRON 33 (L) 04/16/2021 1149   TIBC 346 04/16/2021 1149   FERRITIN 35 04/16/2021 1149   IRONPCTSAT 10 (L) 04/16/2021 1149      RADIOGRAPHIC STUDIES: I have personally reviewed the radiological images as listed and agreed with the findings in the report. DG Chest 2 View  Result Date: 07/03/2021 CLINICAL DATA:  Shortness of breath.  Lung cancer. EXAM: CHEST - 2 VIEW COMPARISON:  07/01/2021 FINDINGS: Patient's known right lower lobe pulmonary lesion is not well demonstrated by x-ray. Architectural distortion noted in both lung bases, similar to prior. No evidence for pulmonary edema or focal airspace consolidation. No pleural effusion. The cardiopericardial silhouette is within normal limits for size. IMPRESSION: 1. No acute cardiopulmonary findings. 2. Patient's known right lower lobe pulmonary lesion not well demonstrated by x-ray. Electronically Signed   By: Misty Stanley M.D.   On: 07/03/2021 13:59   DG Chest 2 View  Result Date: 07/01/2021 CLINICAL DATA:  Fever and cough.  History of lung cancer. EXAM: CHEST - 2 VIEW COMPARISON:  Chest x-ray dated 02/23/2021. FINDINGS: Heart size and mediastinal contours are stable. LEFT chest wall pacemaker/ICD apparatus appears stable. Lungs are clear. No pleural effusion or pneumothorax is seen. No acute-appearing osseous abnormality. IMPRESSION: No active cardiopulmonary disease. No evidence of pneumonia or pulmonary edema. Electronically Signed   By: Franki Cabot M.D.   On: 07/01/2021 11:33   CUP PACEART REMOTE DEVICE CHECK  Result Date: 07/07/2021 Scheduled remote reviewed. Normal device function.  Longest AMS 10 sec. No new alerts. Next remote 91 days.      ASSESSMENT & PLAN:  1. Malignant neoplasm of lung, unspecified laterality, unspecified part of lung  ([place])   2. Shortness of breath    3. Chronic systolic congestive heart failure ([place])   4. Goals of care, counseling/discussion    #History of right upper lobe lung mass status post SBRT. Enlarging right lower lobe lung mass, with mediastinal lymphadenopathy. Suspect primary bronchogenic lung carcinoma. CT images were independently reviewed by me and discussed with patient. Presumed stage IIIB. No biopsy was done due to poor respiratory function.  S/p radiation. Chemo was not offered.  He is doing poorly clinically Repeat CT chest 3 months after radiation.    # Poor oral intake, weight loss Recommend appetite stimulant with Megace 80mg  BID.  He will see nutritionist today.   # SOB with exertion.  Probably due to CHF and emphysema. Oxygen at night.  Pending repeat Echo.   I will refer him to establish care with palliative care service. Prognosis is poor due to multiple medical problems, recent functional decline after radiation.  #Microcytic anemia, iron deficiency. His hemoglobin has improved to 11.2.  Continue oral iron supplementation.    Orders Placed This Encounter  Procedures   CBC with Differential/Platelet    Standing Status:   Future    Standing Expiration Date:   07/18/2022   Comprehensive metabolic panel    Standing Status:   Future    Standing Expiration Date:   07/18/2022    All questions were answered. The patient knows to call the clinic with any problems questions or concerns.  cc Chrismon, [name], PA-C    Return of visit:  2-3 months.  Thank you for this kind referral and the opportunity to participate in the care of this patient. A copy of today's note is routed to referring  provider    Earlie Server, MD, PhD Hematology Oncology [facility] Pager- [phone] 07/18/2021

## 2021-07-18 NOTE — Progress Notes (Signed)
Radiation Oncology Follow up Note  Name: Howard [name]   Date:   07/18/2021 MRN:  [id number] DOB: [birth year]    This 87 y.o. male presents to the clinic today for 1 month follow-up status post radiation therapy to his right lower lobe and right hilar region for non-small cell lung cancer.  REFERRING PROVIDER: Margo Common, PA-C  HPI: Patient is an 87 year old male now at 1 month having completed radiation therapy to his right lung and hilum for non-small cell lung cancer.  Area has demonstrated slow growth over time we elected to treat this with external beam radiation therapy.  He tolerated his treatment extremely well.  He is seen today in routine follow-up.  He is a constellation of symptoms including anorexia some slight dysphagia weakness in his legs slight productive cough and some increased shortness of breath.  He had a chest x-ray back i about 2 weeks ago showing no acute cardiopulmonary findings the right lower lobe lesion was not well visualized on this chest x-ray. COMPLICATIONS OF TREATMENT: none  FOLLOW UP COMPLIANCE: keeps appointments   PHYSICAL EXAM:  BP (!) 97/48   Pulse 79   Resp 18   Wt 179 lb (81.2 kg)   SpO2 94%   BMI 26.43 kg/m  Frail-appearing wheelchair-bound male in NAD.  Well-developed well-nourished patient in NAD. HEENT reveals PERLA, EOMI, discs not visualized.  Oral cavity is clear. No oral mucosal lesions are identified. Neck is clear without evidence of cervical or supraclavicular adenopathy. Lungs are clear to A&P. Cardiac examination is essentially unremarkable with regular rate and rhythm without murmur rub or thrill. Abdomen is benign with no organomegaly or masses noted. Motor sensory and DTR levels are equal and symmetric in the upper and lower extremities. Cranial nerves II through XII are grossly intact. Proprioception is intact. No peripheral adenopathy or edema is identified. No motor or sensory levels are noted. Crude visual fields are within  normal range.  RADIOLOGY RESULTS: Chest x-ray reviewed  PLAN: This time patient is seen medical oncology with his constellation of symptoms.  Is also meeting with the dietary staff.  He will probably have a CT scan of his chest ordered in the near future which I will review when it becomes available.  Based on his overall clinical condition and age he seems to be declining at this point.  I have asked to see him back in 2 to 3 months for follow-up.  Family knows to contact me sooner should any problems develop.  I would like to take this opportunity to thank you for allowing me to participate in the care of your patient.Noreene Filbert, MD

## 2021-07-18 NOTE — Progress Notes (Signed)
Pt in for follow up, multiple reports today.  Caregiver has list and additional comments from rad onc nurse. Noted concerns are no appetite, losing weight, "bouts of diarrhea" none today, cough, shorts of breath, dyspnea on exertion and extremely tired all the time.

## 2021-07-18 NOTE — Progress Notes (Signed)
Nutrition Assessment   Reason for Assessment:  Weight loss, poor appetite   ASSESSMENT:  87 year old male with recurrent stage III adenocarcinoma of right lung s/p radiation.  Past medical history of DM, COPD, GERD, CHF, OSA, NICM with EF of 20-25%  Met with patient and wife in clinic following MD visit.  Patient reports no appetite.  "I can't think of anything I want to eat."  Denies nausea.  Cough during visit.  Reports that usually breakfast is some kind of pastry and coffee.  Yesterday ate a couple of cereal bars mid afternoon and tomatoes and okra for dinner.  Drinking ensure about 1 time per day.  Patient has finished antibiotic.  Reports had diarrhea episode yesterday but may go several days without having any.   Wife has been giving imodium.    Patient has been going to cardiac rehab and during treatment able to complete 10 laps, now down to 2.      Medications: MVI, prilosec, Vit D, lasix, metformin   Labs: reviewed   Anthropometrics:   Height: 69 inches Weight: 180 lb today 193 lb in April 2022 BMI: 26  7% weight loss in the last 3 months, concerning   Estimated Energy Needs  Kcals: 2000-2400 Protein: 98-123 g Fluid: 2 L   NUTRITION DIAGNOSIS: Inadequate oral intake related to radiation treatment side effects, recent sinusitis as evidenced by 7% weight loss in the last 3 months and decreased intake   INTERVENTION:  Recommend liberalize diet Discussed high protein, high calorie diet and handout provided Encouraged setting schedule to have mini meal and place in visible location Discussed having protein at every meal (ie adding milk with pastry for breakfast, adding 1/2 pimento cheese sandwich with tomatoes and okra).   Recommend drinking shake at least 350 calorie or more 1-2 times per day.  Patient will drink shake prior to cardiac rehab if unable to eat breakfast before going.  Patient says that MD prescribed appetite stimulant today and will start taking.   Contact information provided   MONITORING, EVALUATION, GOAL: weight trends, intake   Next Visit: Tuesday, August 30th in clinic  [facility] [address]ides, [place], [place] Registered Dietitian [phone] (mobile)

## 2021-07-19 ENCOUNTER — Other Ambulatory Visit (INDEPENDENT_AMBULATORY_CARE_PROVIDER_SITE_OTHER): Payer: Self-pay | Admitting: Nurse Practitioner

## 2021-07-19 ENCOUNTER — Telehealth: Payer: Self-pay

## 2021-07-19 ENCOUNTER — Ambulatory Visit: Payer: Medicare Other | Admitting: Radiation Oncology

## 2021-07-19 ENCOUNTER — Telehealth: Payer: Self-pay | Admitting: Oncology

## 2021-07-19 DIAGNOSIS — Z9889 Other specified postprocedural states: Secondary | ICD-10-CM

## 2021-07-19 DIAGNOSIS — I701 Atherosclerosis of renal artery: Secondary | ICD-10-CM

## 2021-07-19 DIAGNOSIS — I739 Peripheral vascular disease, unspecified: Secondary | ICD-10-CM

## 2021-07-19 DIAGNOSIS — C3490 Malignant neoplasm of unspecified part of unspecified bronchus or lung: Secondary | ICD-10-CM

## 2021-07-19 NOTE — Telephone Encounter (Signed)
Left VM with patient to make him aware of CT scan and appointment to see Palliative care scheduled. Left days/times/location and instructions. Sending appt reminders in the mail.

## 2021-07-19 NOTE — Telephone Encounter (Signed)
-----   Message from Earlie Server, MD sent at 07/18/2021 10:43 PM EDT ----- Please arrange him to do CT chest wo  mid- late September.  Also refer him to [place]. Service.

## 2021-07-19 NOTE — Telephone Encounter (Signed)
Please schedule CT chest mid -late sept. Also please schedule pt to establish care with Josh, per pt availability. Please notify pt of appts.

## 2021-07-20 ENCOUNTER — Ambulatory Visit (INDEPENDENT_AMBULATORY_CARE_PROVIDER_SITE_OTHER): Payer: Medicare Other | Admitting: Vascular Surgery

## 2021-07-20 ENCOUNTER — Ambulatory Visit (INDEPENDENT_AMBULATORY_CARE_PROVIDER_SITE_OTHER): Payer: Medicare Other

## 2021-07-20 ENCOUNTER — Other Ambulatory Visit (INDEPENDENT_AMBULATORY_CARE_PROVIDER_SITE_OTHER): Payer: Medicare Other

## 2021-07-20 ENCOUNTER — Other Ambulatory Visit: Payer: Self-pay

## 2021-07-20 ENCOUNTER — Encounter (INDEPENDENT_AMBULATORY_CARE_PROVIDER_SITE_OTHER): Payer: Self-pay | Admitting: Vascular Surgery

## 2021-07-20 VITALS — BP 146/65 | HR 89 | Resp 16 | Ht 69.0 in | Wt 179.0 lb

## 2021-07-20 DIAGNOSIS — Z9889 Other specified postprocedural states: Secondary | ICD-10-CM | POA: Diagnosis not present

## 2021-07-20 DIAGNOSIS — I70212 Atherosclerosis of native arteries of extremities with intermittent claudication, left leg: Secondary | ICD-10-CM

## 2021-07-20 DIAGNOSIS — I739 Peripheral vascular disease, unspecified: Secondary | ICD-10-CM

## 2021-07-20 DIAGNOSIS — E119 Type 2 diabetes mellitus without complications: Secondary | ICD-10-CM

## 2021-07-20 DIAGNOSIS — I10 Essential (primary) hypertension: Secondary | ICD-10-CM | POA: Diagnosis not present

## 2021-07-20 DIAGNOSIS — I701 Atherosclerosis of renal artery: Secondary | ICD-10-CM

## 2021-07-20 DIAGNOSIS — I714 Abdominal aortic aneurysm, without rupture, unspecified: Secondary | ICD-10-CM

## 2021-07-20 NOTE — Assessment & Plan Note (Signed)
blood pressure control important in reducing the progression of atherosclerotic disease. On appropriate oral medications.  

## 2021-07-20 NOTE — Assessment & Plan Note (Signed)
His ABIs today are normal at 1.0 on the right and 1.3 on the left with good waveforms and digital pressures bilaterally.  Symptoms are markedly improved after revascularization.  Recheck in 1 year.

## 2021-07-20 NOTE — Progress Notes (Signed)
MRN : [id number]  Howard [name] is a 87 y.o. ([birth year]) male who presents with chief complaint of  Chief Complaint  Patient presents with   Follow-up    ultrasound  .  History of Present Illness: Patient returns today in follow up of multiple vascular issues.  He is doing well.  He is almost 2 years status post endovascular aneurysm repair with associated occlusive disease treatment and right renal artery stent placement.  He is doing well.  He has gotten radiation treatment for lung cancer.  He has some pain in his feet and legs that sound neuropathic in nature.  No open wounds or infection.  No real claudication symptoms.  As far as he knows, his blood pressure has been doing okay and his kidney function has been stable. Noninvasive studies today demonstrate an aneurysm sac and only 2.65 cm with no endoleak.  His renal artery duplex showed no hemodynamically significant stenosis in either renal artery status post right renal artery stenting.  His ABIs today are normal at 1.0 on the right and 1.3 on the left with good waveforms and digital pressures bilaterally  Current Outpatient Medications  Medication Sig Dispense Refill   acetaminophen (TYLENOL) 500 MG tablet Take 1,000 mg by mouth every 6 (six) hours as needed for moderate pain.     albuterol (VENTOLIN HFA) 108 (90 Base) MCG/ACT inhaler Inhale 2 puffs into the lungs every 6 (six) hours as needed for wheezing or shortness of breath. 8 g 2   aspirin EC 81 MG tablet Take 81 mg by mouth daily.     cetirizine (ZYRTEC) 10 MG tablet Take 10 mg by mouth daily.     Cholecalciferol (VITAMIN D3) 1000 UNITS CAPS Take 1,000 Units by mouth daily.     donepezil (ARICEPT) 5 MG tablet Take 5 mg by mouth daily.     ELIQUIS 5 MG TABS tablet TAKE 1 TABLET TWICE A DAY  (SWITCHED FROM PLAVIX) 60 tablet 11   ezetimibe (ZETIA) 10 MG tablet TAKE 1 TABLET DAILY 90 tablet 2   Fluticasone-Umeclidin-Vilant (TRELEGY ELLIPTA) 100-62.5-25 MCG/INH AEPB Inhale 1  puff into the lungs daily. 180 each 3   furosemide (LASIX) 20 MG tablet Take 1 tablet (20 mg total) by mouth daily. 30 tablet 5   Iron-Vitamin C (VITRON-C) 65-125 MG TABS Take 1 tablet by mouth daily. 30 tablet 2   isosorbide mononitrate (IMDUR) 30 MG 24 hr tablet TAKE 1 TABLET DAILY 90 tablet 2   megestrol (MEGACE) 40 MG tablet Take 2 tablets (80 mg total) by mouth 2 (two) times daily. 120 tablet 0   metFORMIN (GLUCOPHAGE) 500 MG tablet Take 1 tablet (500 mg total) by mouth 2 (two) times daily with a meal. 180 tablet 1   metoprolol succinate (TOPROL-XL) 25 MG 24 hr tablet TAKE 1 TABLET AT BEDTIME 90 tablet 2   montelukast (SINGULAIR) 10 MG tablet Take 10 mg by mouth daily as needed (sneezing/allergies.).     Multiple Vitamin (MULTIVITAMIN WITH MINERALS) TABS tablet Take 1 tablet by mouth daily. One-A-Day Multivitamin     omeprazole (PRILOSEC) 40 MG capsule TAKE 1 CAPSULE TWICE DAILY AS NEEDED 180 capsule 3   sacubitril-valsartan (ENTRESTO) 49-51 MG Take 1 tablet by mouth 2 (two) times daily. 180 tablet 0   simvastatin (ZOCOR) 40 MG tablet TAKE 1 TABLET AT BEDTIME 90 tablet 3   spironolactone (ALDACTONE) 25 MG tablet Take 1 tablet (25 mg total) by mouth daily. 90 tablet 3   promethazine-dextromethorphan (  PROMETHAZINE-DM) 6.25-15 MG/5ML syrup Take 5 mLs by mouth 3 (three) times daily as needed for cough. (Patient not taking: No sig reported) 118 mL 0   No current facility-administered medications for this visit.    Past Medical History:  Diagnosis Date   Arthritis    Bell palsy    Bell's palsy 04/12/2015   Cancer [facility])    prostate and skin   Chronic combined systolic and diastolic CHF, NYHA class 1 ([place])    a. 07/2014 Echo: EF 35-40%, Gr 1 DD.   Complete heart block ([place])    a. 11/2010 s/p SJM 2210 [employer] DC PPM, ser# [id number].   Depression    Diabetes mellitus without complication ([place])    Fall 11-10-14   GERD (gastroesophageal reflux disease)    History of prostate cancer     Hyperlipidemia    Hypertension    LBBB (left bundle branch block)    Left-sided Bell's palsy    Lung cancer ([place]) 2016   NICM (nonischemic cardiomyopathy) ([place])    a. 07/2014 Echo: EF 35-40%, mid-apicalanteroseptal DK, Gr 1 DD, mild-mod dil LA.   Non-obstructive CAD    a. 07/2014 Abnl MV;  b. 08/2014 Cath: LM nl, LAD 30p, RI 40p, LCX nl, OM1 40, RCA dominant 30p, 70d-->Med Rx.   Poor balance    Presence of permanent cardiac pacemaker    Sleep apnea    a. cpap   Vertigo    WPW (Wolff-Parkinson-White syndrome)    a. S/P RFCA 1991.    Past Surgical History:  Procedure Laterality Date   ABDOMINAL AORTIC ENDOVASCULAR STENT GRAFT  08/25/2019   Procedure: ABDOMINAL AORTIC ENDOVASCULAR STENT GRAFT;  Surgeon: Algernon Huxley, MD;  Location: [facility] ORS;  Service: Vascular;;   ANGIOPLASTY Left 08/25/2019   Procedure: ANGIOPLASTY;  Surgeon: Algernon Huxley, MD;  Location: [facility] ORS;  Service: Vascular;  Laterality: Left;  left SFA and stent placement   APPLICATION OF WOUND VAC Left 06/07/2015   Procedure: APPLICATION OF WOUND VAC;  Surgeon: Robert Bellow, MD;  Location: [facility] ORS;  Service: General;  Laterality: Left;  left upper back   [place]  08/26/2014   Single vessel obstructive CAD   CARPAL TUNNEL RELEASE  04-04-15   [facility]   CATARACT EXTRACTION  07-31-11 and 09-18-11   Catheter ablation  1991   for WPW   cervical fusion     CHOLECYSTECTOMY  09-07-14   ENDARTERECTOMY FEMORAL Left 08/25/2019   Procedure: ENDARTERECTOMY FEMORAL;  Surgeon: Algernon Huxley, MD;  Location: [facility] ORS;  Service: Vascular;  Laterality: Left;  common and produndis    ENDOVASCULAR REPAIR/STENT GRAFT Right 08/25/2019   Procedure: ENDOVASCULAR REPAIR/STENT GRAFT;  Surgeon: Algernon Huxley, MD;  Location: [facility] ORS;  Service: Vascular;  Laterality: Right;  renal artery   HAND SURGERY     right 1993; left 2005   [place] / REPLACE / REMOVE PACEMAKER     INSERTION OF ILIAC STENT  Bilateral 08/25/2019   Procedure: INSERTION OF ILIAC STENT;  Surgeon: Algernon Huxley, MD;  Location: [facility] ORS;  Service: Vascular;  Laterality: Bilateral;   JOINT REPLACEMENT Left 2013   knee   JOINT REPLACEMENT Right 2004   knee   KNEE SURGERY     left knee 1991 and 1992; right knee [place] N/A 08/26/2014   Procedure: LEFT HEART CATHETERIZATION WITH  CORONARY ANGIOGRAM;  Surgeon: [name], MD;  Location: [facility] CATH LAB;  Service: Cardiovascular;  Laterality: N/A;   LOWER EXTREMITY ANGIOGRAPHY Left 08/23/2019   Procedure: Lower Extremity Angiography;  Surgeon: Algernon Huxley, MD;  Location: [place] CV LAB;  Service: Cardiovascular;  Laterality: Left;   LUMBAR LAMINECTOMY/DECOMPRESSION MICRODISCECTOMY N/A 06/07/2014   Procedure: LUMBAR FOUR TO FIVE LUMBAR LAMINECTOMY/DECOMPRESSION MICRODISCECTOMY 1 LEVEL;  Surgeon: [name], MD;  Location: [place] NEURO ORS;  Service: Neurosurgery;  Laterality: N/A;   LUNG BIOPSY Right 2016   Dr [name]   MOHS SURGERY     PACEMAKER INSERTION     PPM-- St Jude 11/30/10 by [name]   PPM GENERATOR CHANGEOUT N/A 07/09/2019   Procedure: PPM GENERATOR CHANGEOUT;  Surgeon: Deboraha Sprang, MD;  Location: [place] CV LAB;  Service: Cardiovascular;  Laterality: N/A;   PROSTATE SURGERY     cancer--1998, prostatectomy   REPLACEMENT TOTAL KNEE     2004   RIGHT/LEFT HEART CATH AND CORONARY ANGIOGRAPHY Bilateral 04/13/2021   Procedure: RIGHT/LEFT HEART CATH AND CORONARY ANGIOGRAPHY;  Surgeon: Wellington Hampshire, MD;  Location: [place] CV LAB;  Service: Cardiovascular;  Laterality: Bilateral;   ruptured disc     1962 and 1998   TEE WITHOUT CARDIOVERSION N/A 09/01/2019   Procedure: TRANSESOPHAGEAL ECHOCARDIOGRAM (TEE);  Surgeon: [name], MD;  Location: [facility] ORS;  Service: Cardiovascular;  Laterality: N/A;   TEMPORARY PACEMAKER N/A 07/09/2019   Procedure: TEMPORARY PACEMAKER;  Surgeon: Deboraha Sprang, MD;  Location:  [place] CV LAB;  Service: Cardiovascular;  Laterality: N/A;   TRIGGER FINGER RELEASE  01-24-15   WOUND DEBRIDEMENT Left 06/07/2015   Procedure: DEBRIDEMENT WOUND;  Surgeon: Robert Bellow, MD;  Location: [facility] ORS;  Service: General;  Laterality: Left;  left upper back     Social History   Tobacco Use   Smoking status: Former    Packs/day: 1.00    Years: 56.00    Pack years: 56.00    Types: Cigarettes    Quit date: 2011    Years since quitting: 11.5   Smokeless tobacco: Never  Vaping Use   Vaping Use: Never used  Substance Use Topics   Alcohol use: No   Drug use: No      Family History  Problem Relation Age of Onset   Heart attack Mother    Hyperlipidemia Mother    CAD Other    Prostate cancer Neg Hx    Cancer Neg Hx      Allergies  Allergen Reactions   Sulfa Antibiotics Rash    REVIEW OF SYSTEMS (Negative unless checked)   Constitutional: [] Weight loss  [] Fever  [] Chills Cardiac: [] Chest pain   [] Chest pressure   [] Palpitations   [] Shortness of breath when laying flat   [] Shortness of breath at rest   [] Shortness of breath with exertion. Vascular:  [] Pain in legs with walking   [] Pain in legs at rest   [] Pain in legs when laying flat   [x] Claudication   [] Pain in feet when walking  [x] Pain in feet at rest  [] Pain in feet when laying flat   [] History of DVT   [] Phlebitis   [] Swelling in legs   [] Varicose veins   [] Non-healing ulcers Pulmonary:   [] Uses home oxygen   [] Productive cough   [] Hemoptysis   [] Wheeze  [] COPD   [] Asthma Neurologic:  [] Dizziness  [] Blackouts   [] Seizures   [] History of stroke   [] History of TIA  []   Aphasia   [] Temporary blindness   [] Dysphagia   [] Weakness or numbness in arms   [x] Weakness or numbness in legs Musculoskeletal:  [] Arthritis   [] Joint swelling   [] Joint pain   [] Low back pain Hematologic:  [] Easy bruising  [] Easy bleeding   [] Hypercoagulable state   [] Anemic   Gastrointestinal:  [] Blood in stool   [] Vomiting blood   [] Gastroesophageal reflux/heartburn   [] Abdominal pain Genitourinary:  [] Chronic kidney disease   [] Difficult urination  [] Frequent urination  [] Burning with urination   [] Hematuria Skin:  [] Rashes   [] Ulcers   [] Wounds Psychological:  [] History of anxiety   []  History of major depression.  Physical Examination  BP (!) 146/65 (BP Location: Right Arm)   Pulse 89   Resp 16   Ht 5\' 9"  (1.753 m)   Wt 179 lb (81.2 kg)   BMI 26.43 kg/m  Gen:  WD/WN, NAD Head: [place]/AT, No temporalis wasting. Ear/Nose/Throat: Hearing grossly intact, nares w/o erythema or drainage Eyes: Conjunctiva clear. Sclera non-icteric Neck: Supple.  Trachea midline Pulmonary:  Good air movement, no use of accessory muscles.  Cardiac: RRR, no JVD Vascular:  Vessel Right Left  Radial Palpable Palpable                          PT Palpable Palpable  DP Palpable Palpable   Gastrointestinal: soft, non-tender/non-distended. No guarding/reflex.  Musculoskeletal: M/S 5/5 throughout.  No deformity or atrophy.  Mild lower extremity edema. Neurologic: Sensation grossly intact in extremities.  Symmetrical.  Speech is fluent.  Psychiatric: Judgment intact, Mood & affect appropriate for pt's clinical situation. Dermatologic: No rashes or ulcers noted.  No cellulitis or open wounds.      Labs Recent Results (from the past 2160 hour(s))  CBC     Status: Abnormal   Collection Time: 05/15/21 10:05 AM  Result Value Ref Range   WBC 4.2 4.0 - 10.5 K/uL   RBC 4.33 4.22 - 5.81 MIL/uL   Hemoglobin 11.2 (L) 13.0 - 17.0 g/dL   HCT 35.3 (L) 39.0 - 52.0 %   MCV 81.5 80.0 - 100.0 fL   MCH 25.9 (L) 26.0 - 34.0 pg   MCHC 31.7 30.0 - 36.0 g/dL   RDW 19.5 (H) 11.5 - 15.5 %   Platelets 186 150 - 400 K/uL   nRBC 0.0 0.0 - 0.2 %    Comment: Performed at [facility], [address] [place], [place] [address]  Basic metabolic panel     Status: Abnormal   Collection Time: 05/15/21 10:05 AM  Result Value Ref Range   Sodium  139 135 - 145 mmol/L   Potassium 4.0 3.5 - 5.1 mmol/L   Chloride 107 98 - 111 mmol/L   CO2 19 (L) 22 - 32 mmol/L   Glucose, Bld 238 (H) 70 - 99 mg/dL    Comment: Glucose reference range applies only to samples taken after fasting for at least 8 hours.   BUN 28 (H) 8 - 23 mg/dL   Creatinine, Ser 1.34 (H) 0.61 - 1.24 mg/dL   Calcium 9.1 8.9 - 10.3 mg/dL   GFR, Estimated 51 (L) >60 mL/min    Comment: (NOTE) Calculated using the CKD-EPI Creatinine Equation (2021)    Anion gap 13 5 - 15    Comment: Performed at [facility], [address], [place],  [address]  CBC     Status: Abnormal   Collection Time: 05/22/21  9:58 AM  Result  Value Ref Range   WBC 3.9 (L) 4.0 - 10.5 K/uL   RBC 4.24 4.22 - 5.81 MIL/uL   Hemoglobin 11.1 (L) 13.0 - 17.0 g/dL   HCT 34.4 (L) 39.0 - 52.0 %   MCV 81.1 80.0 - 100.0 fL   MCH 26.2 26.0 - 34.0 pg   MCHC 32.3 30.0 - 36.0 g/dL   RDW 19.6 (H) 11.5 - 15.5 %   Platelets 155 150 - 400 K/uL   nRBC 0.0 0.0 - 0.2 %    Comment: Performed at [facility], [address] [place], [place] [address]  Basic metabolic panel     Status: Abnormal   Collection Time: 05/22/21 10:23 AM  Result Value Ref Range   Sodium 138 135 - 145 mmol/L   Potassium 3.9 3.5 - 5.1 mmol/L   Chloride 108 98 - 111 mmol/L   CO2 19 (L) 22 - 32 mmol/L   Glucose, Bld 128 (H) 70 - 99 mg/dL    Comment: Glucose reference range applies only to samples taken after fasting for at least 8 hours.   BUN 25 (H) 8 - 23 mg/dL   Creatinine, Ser 1.24 0.61 - 1.24 mg/dL   Calcium 8.7 (L) 8.9 - 10.3 mg/dL   GFR, Estimated 56 (L) >60 mL/min    Comment: (NOTE) Calculated using the CKD-EPI Creatinine Equation (2021)    Anion gap 11 5 - 15    Comment: Performed at [facility], [address] [place], [place] [address]  CBC     Status: Abnormal   Collection Time: 05/29/21 10:07 AM  Result Value Ref Range   WBC 3.8 (L) 4.0 - 10.5 K/uL   RBC 4.40 4.22 - 5.81 MIL/uL   Hemoglobin  11.5 (L) 13.0 - 17.0 g/dL   HCT 36.1 (L) 39.0 - 52.0 %   MCV 82.0 80.0 - 100.0 fL   MCH 26.1 26.0 - 34.0 pg   MCHC 31.9 30.0 - 36.0 g/dL   RDW 19.7 (H) 11.5 - 15.5 %   Platelets 204 150 - 400 K/uL   nRBC 0.0 0.0 - 0.2 %    Comment: Performed at [facility], [address] [place], [place] [address]  CBC     Status: Abnormal   Collection Time: 06/05/21  9:55 AM  Result Value Ref Range   WBC 3.6 (L) 4.0 - 10.5 K/uL   RBC 4.42 4.22 - 5.81 MIL/uL   Hemoglobin 11.6 (L) 13.0 - 17.0 g/dL   HCT 36.2 (L) 39.0 - 52.0 %   MCV 81.9 80.0 - 100.0 fL   MCH 26.2 26.0 - 34.0 pg   MCHC 32.0 30.0 - 36.0 g/dL   RDW 19.6 (H) 11.5 - 15.5 %   Platelets 164 150 - 400 K/uL   nRBC 0.0 0.0 - 0.2 %    Comment: Performed at [facility], [address] [place],  [address]  CBC     Status: Abnormal   Collection Time: 06/12/21 10:07 AM  Result Value Ref Range   WBC 4.2 4.0 - 10.5 K/uL   RBC 4.47 4.22 - 5.81 MIL/uL   Hemoglobin 11.8 (L) 13.0 - 17.0 g/dL   HCT 36.8 (L) 39.0 - 52.0 %   MCV 82.3 80.0 - 100.0 fL   MCH 26.4 26.0 - 34.0 pg   MCHC 32.1 30.0 - 36.0 g/dL   RDW 19.5 (H) 11.5 - 15.5 %   Platelets 185 150 - 400 K/uL   nRBC 0.0  0.0 - 0.2 %    Comment: Performed at [facility], [address] [place], [place] [address]  Glucose, capillary     Status: Abnormal   Collection Time: 06/20/21  9:48 AM  Result Value Ref Range   Glucose-Capillary 142 (H) 70 - 99 mg/dL    Comment: Glucose reference range applies only to samples taken after fasting for at least 8 hours.  Glucose, capillary     Status: Abnormal   Collection Time: 06/20/21 10:48 AM  Result Value Ref Range   Glucose-Capillary 149 (H) 70 - 99 mg/dL    Comment: Glucose reference range applies only to samples taken after fasting for at least 8 hours.  Glucose, capillary     Status: Abnormal   Collection Time: 06/22/21  9:48 AM  Result Value Ref Range   Glucose-Capillary 226 (H) 70 - 99 mg/dL    Comment: Glucose  reference range applies only to samples taken after fasting for at least 8 hours.  Glucose, capillary     Status: Abnormal   Collection Time: 06/22/21 10:49 AM  Result Value Ref Range   Glucose-Capillary 135 (H) 70 - 99 mg/dL    Comment: Glucose reference range applies only to samples taken after fasting for at least 8 hours.  Glucose, capillary     Status: Abnormal   Collection Time: 06/27/21  9:43 AM  Result Value Ref Range   Glucose-Capillary 173 (H) 70 - 99 mg/dL    Comment: Glucose reference range applies only to samples taken after fasting for at least 8 hours.  Glucose, capillary     Status: Abnormal   Collection Time: 06/27/21 10:51 AM  Result Value Ref Range   Glucose-Capillary 139 (H) 70 - 99 mg/dL    Comment: Glucose reference range applies only to samples taken after fasting for at least 8 hours.  SARS CORONAVIRUS 2 (TAT 6-24 HRS) Nasopharyngeal Nasopharyngeal Swab     Status: None   Collection Time: 07/01/21 10:54 AM   Specimen: Nasopharyngeal Swab  Result Value Ref Range   SARS Coronavirus 2 NEGATIVE NEGATIVE    Comment: (NOTE) SARS-CoV-2 target nucleic acids are NOT DETECTED.  The SARS-CoV-2 RNA is generally detectable in upper and lower respiratory specimens during the acute phase of infection. Negative results do not preclude SARS-CoV-2 infection, do not rule out co-infections with other pathogens, and should not be used as the sole basis for treatment or other patient management decisions. Negative results must be combined with clinical observations, patient history, and epidemiological information. The expected result is Negative.  Fact Sheet for Patients: SugarRoll.be  Fact Sheet for Healthcare Providers: https://www.woods-mathews.com/  This test is not yet approved or cleared by the [place] FDA and  has been authorized for detection and/or diagnosis of SARS-CoV-2 by FDA under an Emergency Use Authorization  (EUA). This EUA will remain  in effect (meaning this test can be used) for the duration of the COVID-19 declaration under Se ction 564(b)(1) of the Act, 21 U.S.C. section 360bbb-3(b)(1), unless the authorization is terminated or revoked sooner.  Performed at [facility], [place] [address], [place], [place] [address]   Influenza A and B antigen     Status: None   Collection Time: 07/01/21 12:12 PM  Result Value Ref Range   INFLUENZA A ANTIGEN, POC NEGATIVE NEGATIVE   INFLUENZA B ANTIGEN, POC NEGATIVE NEGATIVE  Brain natriuretic peptide     Status: Abnormal   Collection Time: 07/03/21  1:35 PM  Result Value  Ref Range   B Natriuretic Peptide 771.5 (H) 0.0 - 100.0 pg/mL    Comment: Performed at [facility], [address] [place], [place] [address]  Comprehensive metabolic panel     Status: Abnormal   Collection Time: 07/03/21  1:56 PM  Result Value Ref Range   Sodium 134 (L) 135 - 145 mmol/L   Potassium 4.1 3.5 - 5.1 mmol/L   Chloride 102 98 - 111 mmol/L   CO2 21 (L) 22 - 32 mmol/L   Glucose, Bld 106 (H) 70 - 99 mg/dL    Comment: Glucose reference range applies only to samples taken after fasting for at least 8 hours.   BUN 22 8 - 23 mg/dL   Creatinine, Ser 1.20 0.61 - 1.24 mg/dL   Calcium 8.9 8.9 - 10.3 mg/dL   Total Protein 7.3 6.5 - 8.1 g/dL   Albumin 3.8 3.5 - 5.0 g/dL   AST 23 15 - 41 U/L   ALT 12 0 - 44 U/L   Alkaline Phosphatase 56 38 - 126 U/L   Total Bilirubin 0.8 0.3 - 1.2 mg/dL   GFR, Estimated 58 (L) >60 mL/min    Comment: (NOTE) Calculated using the CKD-EPI Creatinine Equation (2021)    Anion gap 11 5 - 15    Comment: Performed at [facility], [address] [place], [place] [address]  CBC with Differential     Status: Abnormal   Collection Time: 07/03/21  1:56 PM  Result Value Ref Range   WBC 4.9 4.0 - 10.5 K/uL   RBC 4.21 (L) 4.22 - 5.81 MIL/uL   Hemoglobin 11.2 (L) 13.0 - 17.0 g/dL   HCT 34.7 (L) 39.0 - 52.0 %   MCV 82.4 80.0 -  100.0 fL   MCH 26.6 26.0 - 34.0 pg   MCHC 32.3 30.0 - 36.0 g/dL   RDW 17.4 (H) 11.5 - 15.5 %   Platelets 210 150 - 400 K/uL   nRBC 0.0 0.0 - 0.2 %   Neutrophils Relative % 52 %   Neutro Abs 2.6 1.7 - 7.7 K/uL   Lymphocytes Relative 20 %   Lymphs Abs 1.0 0.7 - 4.0 K/uL   Monocytes Relative 16 %   Monocytes Absolute 0.8 0.1 - 1.0 K/uL   Eosinophils Relative 11 %   Eosinophils Absolute 0.5 0.0 - 0.5 K/uL   Basophils Relative 1 %   Basophils Absolute 0.0 0.0 - 0.1 K/uL   Immature Granulocytes 0 %   Abs Immature Granulocytes 0.01 0.00 - 0.07 K/uL    Comment: Performed at [facility], [address] [place], [place] [address]  CUP PACEART REMOTE DEVICE CHECK     Status: None   Collection Time: 07/06/21  2:00 AM  Result Value Ref Range   Date Time Interrogation Session [id number]    Pulse Generator Manufacturer SJ[employer]    Pulse Gen Model 2272 Assurity MRI    Pulse Gen Serial Number [id number]    Clinic Name [facility]    Implantable Pulse Generator Type Implantable Pulse Generator    Implantable Pulse Generator Implant Date 84166063    Implantable Lead Manufacturer [employer] IsoFlex Optim    Implantable Lead Serial Number [id number]    Implantable Lead Implant Date 35573220    Implantable Lead Location [id number]    Implantable Lead Manufacturer [facility]    Implantable Lead Model [id number] Tendril STS    Implantable Lead Serial Number [id number]  Implantable Lead Implant Date 35573220    Implantable Lead Location [phone]    Lead Channel Setting Sensing Sensitivity 4.0 mV   Lead Channel Setting Sensing Adaptation Mode Fixed Pacing    Lead Channel Setting Pacing Amplitude 2.0 V   Lead Channel Setting Pacing Pulse Width 0.5 ms   Lead Channel Setting Pacing Amplitude 1.5 V   Lead Channel Status NULL    Lead Channel Impedance Value 380 ohm   Lead Channel Sensing Intrinsic Amplitude 2.9 mV   Lead Channel Pacing Threshold Amplitude 0.75 V   Lead Channel  Pacing Threshold Pulse Width 0.5 ms   Lead Channel Status NULL    Lead Channel Impedance Value 400 ohm   Lead Channel Sensing Intrinsic Amplitude 12.0 mV   Lead Channel Pacing Threshold Amplitude 1.25 V   Lead Channel Pacing Threshold Pulse Width 0.5 ms   Battery Status MOS    Battery Remaining Longevity 91 mo   Battery Remaining Percentage 82.0 %   Battery Voltage 3.01 V   Brady Statistic RA Percent Paced 1.0 %   Brady Statistic RV Percent Paced 99.0 %   Brady Statistic AP VP Percent 1.0 %   Brady Statistic AS VP Percent 99.0 %   Brady Statistic AP VS Percent 1.0 %   Brady Statistic AS VS Percent 1.0 %    Radiology DG Chest 2 View  Result Date: 07/03/2021 CLINICAL DATA:  Shortness of breath.  Lung cancer. EXAM: CHEST - 2 VIEW COMPARISON:  07/01/2021 FINDINGS: Patient's known right lower lobe pulmonary lesion is not well demonstrated by x-ray. Architectural distortion noted in both lung bases, similar to prior. No evidence for pulmonary edema or focal airspace consolidation. No pleural effusion. The cardiopericardial silhouette is within normal limits for size. IMPRESSION: 1. No acute cardiopulmonary findings. 2. Patient's known right lower lobe pulmonary lesion not well demonstrated by x-ray. Electronically Signed   By: Misty Stanley M.D.   On: 07/03/2021 13:59   DG Chest 2 View  Result Date: 07/01/2021 CLINICAL DATA:  Fever and cough.  History of lung cancer. EXAM: CHEST - 2 VIEW COMPARISON:  Chest x-ray dated 02/23/2021. FINDINGS: Heart size and mediastinal contours are stable. LEFT chest wall pacemaker/ICD apparatus appears stable. Lungs are clear. No pleural effusion or pneumothorax is seen. No acute-appearing osseous abnormality. IMPRESSION: No active cardiopulmonary disease. No evidence of pneumonia or pulmonary edema. Electronically Signed   By: Franki Cabot M.D.   On: 07/01/2021 11:33   CUP PACEART REMOTE DEVICE CHECK  Result Date: 07/07/2021 Scheduled remote reviewed. Normal  device function.  Longest AMS 10 sec. No new alerts. Next remote 91 days.   Assessment/Plan  Essential hypertension blood pressure control important in reducing the progression of atherosclerotic disease. On appropriate oral medications.   Type 2 diabetes mellitus without complications (HCC) blood glucose control important in reducing the progression of atherosclerotic disease. Also, involved in wound healing. On appropriate medications.   Atherosclerosis of native arteries of extremity with intermittent claudication (HCC) His ABIs today are normal at 1.0 on the right and 1.3 on the left with good waveforms and digital pressures bilaterally.  Symptoms are markedly improved after revascularization.  Recheck in 1 year.  AAA (abdominal aortic aneurysm) without rupture (HCC) Status postrepair with no endoleak and a small aortic sac size.  Recheck in 1 year.  Renal artery stenosis (HCC) Status post right renal artery stent placement now with patent renal arteries with no hemodynamically significant stenosis.  Blood pressure and renal function stable.  Recheck in 1 year    Leotis Pain, MD  07/20/2021 10:41 AM    This note was created with Dragon medical transcription system.  Any errors from dictation are purely unintentional

## 2021-07-20 NOTE — Assessment & Plan Note (Signed)
blood glucose control important in reducing the progression of atherosclerotic disease. Also, involved in wound healing. On appropriate medications.  

## 2021-07-20 NOTE — Assessment & Plan Note (Signed)
Status postrepair with no endoleak and a small aortic sac size.  Recheck in 1 year.

## 2021-07-20 NOTE — Assessment & Plan Note (Signed)
Status post right renal artery stent placement now with patent renal arteries with no hemodynamically significant stenosis.  Blood pressure and renal function stable.  Recheck in 1 year

## 2021-07-21 ENCOUNTER — Other Ambulatory Visit: Payer: Self-pay | Admitting: Cardiovascular Disease

## 2021-07-23 ENCOUNTER — Other Ambulatory Visit: Payer: Self-pay

## 2021-07-23 ENCOUNTER — Encounter: Payer: Medicare Other | Admitting: *Deleted

## 2021-07-23 ENCOUNTER — Other Ambulatory Visit: Payer: Self-pay | Admitting: Pulmonary Disease

## 2021-07-23 DIAGNOSIS — I5042 Chronic combined systolic (congestive) and diastolic (congestive) heart failure: Secondary | ICD-10-CM | POA: Insufficient documentation

## 2021-07-23 DIAGNOSIS — I428 Other cardiomyopathies: Secondary | ICD-10-CM | POA: Diagnosis not present

## 2021-07-23 DIAGNOSIS — E119 Type 2 diabetes mellitus without complications: Secondary | ICD-10-CM | POA: Diagnosis not present

## 2021-07-23 DIAGNOSIS — J449 Chronic obstructive pulmonary disease, unspecified: Secondary | ICD-10-CM | POA: Diagnosis not present

## 2021-07-23 DIAGNOSIS — G4733 Obstructive sleep apnea (adult) (pediatric): Secondary | ICD-10-CM | POA: Diagnosis not present

## 2021-07-23 DIAGNOSIS — Z79899 Other long term (current) drug therapy: Secondary | ICD-10-CM | POA: Diagnosis not present

## 2021-07-23 DIAGNOSIS — I739 Peripheral vascular disease, unspecified: Secondary | ICD-10-CM | POA: Diagnosis not present

## 2021-07-23 DIAGNOSIS — R531 Weakness: Secondary | ICD-10-CM | POA: Insufficient documentation

## 2021-07-23 DIAGNOSIS — D735 Infarction of spleen: Secondary | ICD-10-CM | POA: Diagnosis not present

## 2021-07-23 DIAGNOSIS — I10 Essential (primary) hypertension: Secondary | ICD-10-CM | POA: Diagnosis not present

## 2021-07-23 DIAGNOSIS — C3411 Malignant neoplasm of upper lobe, right bronchus or lung: Secondary | ICD-10-CM | POA: Diagnosis not present

## 2021-07-23 DIAGNOSIS — Z87891 Personal history of nicotine dependence: Secondary | ICD-10-CM | POA: Diagnosis not present

## 2021-07-23 DIAGNOSIS — Z7901 Long term (current) use of anticoagulants: Secondary | ICD-10-CM | POA: Diagnosis not present

## 2021-07-23 LAB — GLUCOSE, CAPILLARY: Glucose-Capillary: 141 mg/dL — ABNORMAL HIGH (ref 70–99)

## 2021-07-23 NOTE — Telephone Encounter (Signed)
Please advise if refill for inhaler is appropriate.

## 2021-07-23 NOTE — Progress Notes (Signed)
Daily Session Note  Patient Details  Name: Howard [name] MRN: [id number] Date of Birth: 1931/01/01 Referring Provider:   Flowsheet Row Cardiac Rehab from 05/24/2021 in [facility] Cardiac and Pulmonary Rehab  Referring Provider Gollan       Encounter Date: 07/23/2021  Check In:  Session Check In - 07/23/21 1037       Check-In   Supervising physician immediately available to respond to emergencies See telemetry face sheet for immediately available ER MD    Location [facility]-Cardiac & Pulmonary Rehab    Staff Present Renita Papa, RN Moises Blood, BS, ACSM CEP, Exercise Physiologist;Kelly Rosalia Hammers, MPA, RN;Joseph Tessie Fass, [place]    Virtual Visit No    Medication changes reported     No    Fall or balance concerns reported    No    Warm-up and Cool-down Performed on first and last piece of equipment    Resistance Training Performed Yes    VAD Patient? No    PAD/SET Patient? No      Pain Assessment   Currently in Pain? No/denies               Exercise Prescription Changes - 07/23/21 1000       Home Exercise Plan   Plans to continue exercise at Home (comment)   walking, Youtube staff videos   Frequency Add 2 additional days to program exercise sessions.   Start with 1   Initial Home Exercises Provided 07/23/21             Social History   Tobacco Use  Smoking Status Former   Packs/day: 1.00   Years: 56.00   Pack years: 56.00   Types: Cigarettes   Quit date: 2011   Years since quitting: 11.5  Smokeless Tobacco Never    Goals Met:  Independence with exercise equipment Exercise tolerated well No report of cardiac concerns or symptoms Strength training completed today  Goals Unmet:  Not Applicable  Comments: Pt able to follow exercise prescription today without complaint.  Will continue to monitor for progression.    Dr. Emily Filbert is Medical Director for [facility].  Dr. Ottie Glazier is Medical Director for [facility]  Pulmonary Rehabilitation.

## 2021-07-24 ENCOUNTER — Other Ambulatory Visit: Payer: Self-pay

## 2021-07-24 ENCOUNTER — Ambulatory Visit (INDEPENDENT_AMBULATORY_CARE_PROVIDER_SITE_OTHER): Payer: Medicare Other

## 2021-07-24 ENCOUNTER — Telehealth: Payer: Self-pay | Admitting: Cardiovascular Disease

## 2021-07-24 DIAGNOSIS — I5042 Chronic combined systolic (congestive) and diastolic (congestive) heart failure: Secondary | ICD-10-CM

## 2021-07-24 LAB — ECHOCARDIOGRAM COMPLETE
AR max vel: 3.25 cm2
AV Area VTI: 3.1 cm2
AV Area mean vel: 2.94 cm2
AV Mean grad: 3 mmHg
AV Peak grad: 6.2 mmHg
Ao pk vel: 1.24 m/s
Area-P 1/2: 7.16 cm2
Calc EF: 18.5 %
MV VTI: 1.99 cm2
S' Lateral: 4.4 cm
Single Plane A2C EF: 25 %
Single Plane A4C EF: 14.3 %

## 2021-07-24 NOTE — Telephone Encounter (Signed)
Pt c/o Shortness Of Breath: STAT if SOB developed within the last 24 hours or pt is noticeably SOB on the phone  1. Are you currently SOB (can you hear that pt is SOB on the phone)? no  2. How long have you been experiencing SOB? Started yesterday   3. Are you SOB when sitting or when up moving around? All the time   4. Are you currently experiencing any other symptoms? No  Patient here for ECHO - patient spouse wanted me to make office aware of SOB

## 2021-07-24 NOTE — Telephone Encounter (Signed)
ECHO technician notify RN of pt's c/o [facility] during ECHO, has been since yesterday constantly except when on 02 at night, RN advised Dr. [name] of pt's c/o [facility], Dr. [name] went an evaluated pt while having ECHO. NAD noted, ECHO finished, pt has f/u tomorrow in clinic with Dr. [name] as well.

## 2021-07-25 ENCOUNTER — Ambulatory Visit (INDEPENDENT_AMBULATORY_CARE_PROVIDER_SITE_OTHER): Payer: Medicare Other | Admitting: Cardiovascular Disease

## 2021-07-25 ENCOUNTER — Encounter: Payer: Self-pay | Admitting: Cardiovascular Disease

## 2021-07-25 VITALS — BP 120/60 | HR 74 | Ht 69.0 in | Wt 181.2 lb

## 2021-07-25 DIAGNOSIS — I428 Other cardiomyopathies: Secondary | ICD-10-CM | POA: Diagnosis not present

## 2021-07-25 DIAGNOSIS — I739 Peripheral vascular disease, unspecified: Secondary | ICD-10-CM

## 2021-07-25 DIAGNOSIS — Z950 Presence of cardiac pacemaker: Secondary | ICD-10-CM

## 2021-07-25 DIAGNOSIS — I701 Atherosclerosis of renal artery: Secondary | ICD-10-CM | POA: Diagnosis not present

## 2021-07-25 DIAGNOSIS — I5042 Chronic combined systolic (congestive) and diastolic (congestive) heart failure: Secondary | ICD-10-CM | POA: Diagnosis not present

## 2021-07-25 DIAGNOSIS — I2510 Atherosclerotic heart disease of native coronary artery without angina pectoris: Secondary | ICD-10-CM | POA: Diagnosis not present

## 2021-07-25 DIAGNOSIS — E785 Hyperlipidemia, unspecified: Secondary | ICD-10-CM | POA: Diagnosis not present

## 2021-07-25 MED ORDER — ENTRESTO 49-51 MG PO TABS: 1.0000 | ORAL_TABLET | Freq: Two times a day (BID) | ORAL | 3 refills | Status: AC

## 2021-07-25 NOTE — Progress Notes (Signed)
Cardiology Office Note  Date:  07/25/2021   ID:  Howard [name], DOB 11/22/1931, MRN [id number]  PCP:  Margo Common, PA-C   Chief Complaint  Patient presents with   Follow up Echo    Patient c/o shortness of breath with little exertion. Medications reviewed by the patient verbally.     HPI:  Mr. Dunnavant is a 87 year old-year-old gentleman with a hx of  DM II complete heart block, status post pacemaker in 2011,  nonischemic cardiomyopathy,  ejection fraction 30-35%   coronary artery disease, 70% distal RCA disease, 30 and 40% lesions in his ramus, LAD  admission to the hospital 09/05/2014  with acute cholecystitis, ileus.  Cardiac catheterization at that time for positive stress test . Lung CA  Echo 07/2019: EF 55 to 60% Ef 2022: 20 to 25% He presents for follow-up of his CAD  Last seen by myself in clinic December 2021 Diagnosis lung malignancy Seen by one of our providers April 2022 At that time reported worsening shortness of breath, cough Echocardiogram April 2022 ejection fraction 20 to 25% dilated IVC Catheterization performed mild to moderate three-vessel disease, nonischemic cardiomyopathy On catheterization was not felt to be volume overloaded Seen back in follow-up in clinic Apr 23, 2021 Has started chemotherapy Entresto increased to 49/51 twice daily Lasix 20 daily spironolactone 25 daily metoprolol 25 daily Seen back in clinic June 04, 2021 Completing radiation treatment He did not tolerate Farxiga  Echocardiogram performed yesterday Ejection fraction 20 to 25% global hypokinesis LVH  SOB on exertion, cough, sputum Started cardiac rehab Does new step, 40 min Weight down, started on stimulated  EKG personally reviewed by myself on todays visit Paced rhythm rate 74 bpm  Other past medical history reviewed hospital August 30, 2019 with abdominal pain, CT scan confirming splenic infarct, right renal artery malperfusion TEE with no evidence of left atrial  thrombus or left atrial appendage thrombus Severe aortic atherosclerosis,  New pacer 06/2019 Pacer downloads reviewed: Stable  Sept 2020, vascular records reviewed with him in detail AAA, right renal artery stenosis, severe aortoiliac occlusive disease with left iliac occlusion and right iliac stenosis, left SFA stenosis, rest pain and pregangrenous changes to the left great toe 1. Left common femoral artery and profunda femoris artery endarterectomy with core matrix patch angioplasty 2. Placement of a 6 mm diameter by 15 cm length via bond stent left superficial femoral artery postdilated with a 5 mm diameter Lutonix drug-coated angioplasty balloon 3 Right renal artery stent placement with 7 mm diameter by 16 mm length lifestream stent---Placement of a 23 mm diameter 14 cm length Gore Excluder Endoprosthesis main body right with a 14 mm diameter by 12 cm length left contralateral limb ---Stent placement in the right external iliac artery with 10 mm diameter by 58 mm length lifestream stent for occlusive disease --Gore excluder limb extension with a 12 mm diameter by 14 cm length limb to the distal right external iliac artery for occlusive disease --Viabahn stent placement to the left external iliac artery with 13 mm diameter by 10 cm length stent for occlusive disease ---Fogarty embolectomy left SFA  TEE 08/2019  . The left ventricle has low normal systolic function, with an ejection  fraction of 50-55%. The cavity size was normal. There is mildly increased  left ventricular wall thickness. Left ventricular diastolic Doppler  parameters are consistent with  indeterminate diastolic dysfunction. Indeterminate filling pressures No  evidence of left ventricular regional wall motion abnormalities.   2. The right  ventricle has normal systolc function. There is no increase  in right ventricular wall thickness.   3. Left atrial size was moderately dilated.   4. No evidence of a thrombus present in  the left atrium or left atrial  appendage.   5. Evidence of atrial level shunting detected by color flow Doppler, PFO  present, saline contrast bubble study positive for PFO, small number of  bubbles crossing at rest, unable to test with valsalva   6. Moderate plaque in the descending aorta and aortic arch.   Limited by history of back pain, back surgery x6  hospital November 2016 for abscess on his back, had surgery Nodules found in the right lung, biopsy documenting adenocarcinoma 1.3 cm, treated with radiation  Previous CT chest 03/2016 Decreasing size of pathology-proven adenocarcinoma of right upper lobe, with associated radiation treatment effects of the lung.   PMH:   has a past medical history of Arthritis, Bell palsy, Bell's palsy (04/12/2015), Cancer ([place]), Chronic combined systolic and diastolic CHF, NYHA class 1 ([place]), Complete heart block ([place]), Depression, Diabetes mellitus without complication ([place]), Fall (11-10-14), GERD (gastroesophageal reflux disease), History of prostate cancer, Hyperlipidemia, Hypertension, LBBB (left bundle branch block), Left-sided Bell's palsy, Lung cancer ([place]) (2016), NICM (nonischemic cardiomyopathy) ([place]), Non-obstructive CAD, Poor balance, Presence of permanent cardiac pacemaker, Sleep apnea, Vertigo, and WPW (Wolff-Parkinson-White syndrome).  PSH:    Past Surgical History:  Procedure Laterality Date   ABDOMINAL AORTIC ENDOVASCULAR STENT GRAFT  08/25/2019   Procedure: ABDOMINAL AORTIC ENDOVASCULAR STENT GRAFT;  Surgeon: Algernon Huxley, MD;  Location: [facility] ORS;  Service: Vascular;;   ANGIOPLASTY Left 08/25/2019   Procedure: ANGIOPLASTY;  Surgeon: Algernon Huxley, MD;  Location: [facility] ORS;  Service: Vascular;  Laterality: Left;  left SFA and stent placement   APPLICATION OF WOUND VAC Left 06/07/2015   Procedure: APPLICATION OF WOUND VAC;  Surgeon: Robert Bellow, MD;  Location: [facility] ORS;  Service: General;  Laterality: Left;  left upper back   [place]  08/26/2014   Single vessel obstructive CAD   CARPAL TUNNEL RELEASE  04-04-15   [facility]   CATARACT EXTRACTION  07-31-11 and 09-18-11   Catheter ablation  1991   for WPW   cervical fusion     CHOLECYSTECTOMY  09-07-14   ENDARTERECTOMY FEMORAL Left 08/25/2019   Procedure: ENDARTERECTOMY FEMORAL;  Surgeon: Algernon Huxley, MD;  Location: [facility] ORS;  Service: Vascular;  Laterality: Left;  common and produndis    ENDOVASCULAR REPAIR/STENT GRAFT Right 08/25/2019   Procedure: ENDOVASCULAR REPAIR/STENT GRAFT;  Surgeon: Algernon Huxley, MD;  Location: [facility] ORS;  Service: Vascular;  Laterality: Right;  renal artery   HAND SURGERY     right 1993; left 2005   [place] / REPLACE / REMOVE PACEMAKER     INSERTION OF ILIAC STENT Bilateral 08/25/2019   Procedure: INSERTION OF ILIAC STENT;  Surgeon: Algernon Huxley, MD;  Location: [facility] ORS;  Service: Vascular;  Laterality: Bilateral;   JOINT REPLACEMENT Left 2013   knee   JOINT REPLACEMENT Right 2004   knee   KNEE SURGERY     left knee 1991 and 1992; right knee [place] N/A 08/26/2014   Procedure: LEFT HEART CATHETERIZATION WITH CORONARY ANGIOGRAM;  Surgeon: [name], MD;  Location: [facility] CATH LAB;  Service: Cardiovascular;  Laterality: N/A;   LOWER EXTREMITY ANGIOGRAPHY Left  08/23/2019   Procedure: Lower Extremity Angiography;  Surgeon: Algernon Huxley, MD;  Location: [place] CV LAB;  Service: Cardiovascular;  Laterality: Left;   LUMBAR LAMINECTOMY/DECOMPRESSION MICRODISCECTOMY N/A 06/07/2014   Procedure: LUMBAR FOUR TO FIVE LUMBAR LAMINECTOMY/DECOMPRESSION MICRODISCECTOMY 1 LEVEL;  Surgeon: [name], MD;  Location: [place] NEURO ORS;  Service: Neurosurgery;  Laterality: N/A;   LUNG BIOPSY Right 2016   Dr [name]   MOHS SURGERY     PACEMAKER INSERTION     PPM-- St Jude 11/30/10 by [name]   PPM GENERATOR CHANGEOUT N/A 07/09/2019   Procedure: PPM GENERATOR CHANGEOUT;   Surgeon: Deboraha Sprang, MD;  Location: [place] CV LAB;  Service: Cardiovascular;  Laterality: N/A;   PROSTATE SURGERY     cancer--1998, prostatectomy   REPLACEMENT TOTAL KNEE     2004   RIGHT/LEFT HEART CATH AND CORONARY ANGIOGRAPHY Bilateral 04/13/2021   Procedure: RIGHT/LEFT HEART CATH AND CORONARY ANGIOGRAPHY;  Surgeon: Wellington Hampshire, MD;  Location: [place] CV LAB;  Service: Cardiovascular;  Laterality: Bilateral;   ruptured disc     1962 and 1998   TEE WITHOUT CARDIOVERSION N/A 09/01/2019   Procedure: TRANSESOPHAGEAL ECHOCARDIOGRAM (TEE);  Surgeon: [name], MD;  Location: [facility] ORS;  Service: Cardiovascular;  Laterality: N/A;   TEMPORARY PACEMAKER N/A 07/09/2019   Procedure: TEMPORARY PACEMAKER;  Surgeon: Deboraha Sprang, MD;  Location: [place] CV LAB;  Service: Cardiovascular;  Laterality: N/A;   TRIGGER FINGER RELEASE  01-24-15   WOUND DEBRIDEMENT Left 06/07/2015   Procedure: DEBRIDEMENT WOUND;  Surgeon: Robert Bellow, MD;  Location: [facility] ORS;  Service: General;  Laterality: Left;  left upper back    Current Outpatient Medications  Medication Sig Dispense Refill   acetaminophen (TYLENOL) 500 MG tablet Take 1,000 mg by mouth every 6 (six) hours as needed for moderate pain.     albuterol (VENTOLIN HFA) 108 (90 Base) MCG/ACT inhaler INHALE 2 PUFFS INTO LUNGS EVERY 6 HOURS AS NEEDED FOR WHEEZING OR SHORTNESS OF BREATH 8.5 g 6   aspirin EC 81 MG tablet Take 81 mg by mouth daily.     cetirizine (ZYRTEC) 10 MG tablet Take 10 mg by mouth daily.     Cholecalciferol (VITAMIN D3) 1000 UNITS CAPS Take 1,000 Units by mouth daily.     donepezil (ARICEPT) 5 MG tablet Take 5 mg by mouth daily.     ELIQUIS 5 MG TABS tablet TAKE 1 TABLET TWICE A DAY  (SWITCHED FROM PLAVIX) 60 tablet 11   ezetimibe (ZETIA) 10 MG tablet TAKE 1 TABLET DAILY 90 tablet 2   Fluticasone-Umeclidin-Vilant (TRELEGY ELLIPTA) 100-62.5-25 MCG/INH AEPB Inhale 1 puff into the lungs daily. 180 each 3    furosemide (LASIX) 20 MG tablet Take 1 tablet (20 mg total) by mouth daily. 30 tablet 5   Iron-Vitamin C (VITRON-C) 65-125 MG TABS Take 1 tablet by mouth daily. 30 tablet 2   isosorbide mononitrate (IMDUR) 30 MG 24 hr tablet TAKE 1 TABLET DAILY 90 tablet 2   megestrol (MEGACE) 40 MG tablet Take 2 tablets (80 mg total) by mouth 2 (two) times daily. 120 tablet 0   metFORMIN (GLUCOPHAGE) 500 MG tablet Take 1 tablet (500 mg total) by mouth 2 (two) times daily with a meal. 180 tablet 1   metoprolol succinate (TOPROL-XL) 25 MG 24 hr tablet TAKE 1 TABLET AT BEDTIME 90 tablet 2   montelukast (SINGULAIR) 10 MG tablet Take 10 mg by mouth daily as needed (sneezing/allergies.).  Multiple Vitamin (MULTIVITAMIN WITH MINERALS) TABS tablet Take 1 tablet by mouth daily. One-A-Day Multivitamin     omeprazole (PRILOSEC) 40 MG capsule TAKE 1 CAPSULE TWICE DAILY AS NEEDED 180 capsule 3   promethazine-dextromethorphan (PROMETHAZINE-DM) 6.25-15 MG/5ML syrup Take 5 mLs by mouth 3 (three) times daily as needed for cough. 118 mL 0   simvastatin (ZOCOR) 40 MG tablet TAKE 1 TABLET AT BEDTIME 90 tablet 3   spironolactone (ALDACTONE) 25 MG tablet Take 1 tablet (25 mg total) by mouth daily. 90 tablet 3   sacubitril-valsartan (ENTRESTO) 49-51 MG Take 1 tablet by mouth 2 (two) times daily. 180 tablet 3   No current facility-administered medications for this visit.     Allergies:   Sulfa antibiotics   Social History:  The patient  reports that he quit smoking about 11 years ago. His smoking use included cigarettes. He has a 56.00 pack-year smoking history. He has never used smokeless tobacco. He reports that he does not drink alcohol and does not use drugs.   Family History:   family history includes CAD in an other family member; Heart attack in his mother; Hyperlipidemia in his mother.    Review of Systems: Review of Systems  Constitutional: Negative.   HENT: Negative.    Respiratory:  Positive for cough and sputum  production.   Cardiovascular: Negative.   Gastrointestinal: Negative.   Musculoskeletal:  Positive for back pain and joint pain.  Neurological: Negative.   Psychiatric/Behavioral: Negative.    All other systems reviewed and are negative.   PHYSICAL EXAM: VS:  BP 120/60 (BP Location: Left Arm, Patient Position: Sitting, Cuff Size: Normal)   Pulse 74   Ht 5\' 9"  (1.753 m)   Wt 181 lb 4 oz (82.2 kg)   SpO2 91%   BMI 26.77 kg/m  , BMI Body mass index is 26.77 kg/m. Constitutional:  oriented to person, place, and time. No distress.  HENT:  Head: Grossly normal Eyes:  no discharge. No scleral icterus.  Neck: No JVD, no carotid bruits  Cardiovascular: Regular rate and rhythm, no murmurs appreciated Pulmonary/Chest: Clear to auscultation bilaterally, no wheezes or rails Abdominal: Soft.  no distension.  no tenderness.  Musculoskeletal: Normal range of motion Neurological:  normal muscle tone. Coordination normal. No atrophy Skin: Skin warm and dry Psychiatric: normal affect, pleasant   Recent Labs: 07/03/2021: ALT 12; B Natriuretic Peptide 771.5; BUN 22; Creatinine, Ser 1.20; Hemoglobin 11.2; Platelets 210; Potassium 4.1; Sodium 134    Lipid Panel Lab Results  Component Value Date   CHOL 130 11/13/2020   HDL 43 11/13/2020   LDLCALC 63 11/13/2020   TRIG 137 11/13/2020      Wt Readings from Last 3 Encounters:  07/25/21 181 lb 4 oz (82.2 kg)  07/20/21 179 lb (81.2 kg)  07/18/21 179 lb (81.2 kg)      ASSESSMENT AND PLAN:  Coronary artery disease involving native coronary artery of native heart without angina pectoris -  Currently with no symptoms of angina. No further workup at this time. Continue current medication regimen.  Complete heart block (HCC) - Plan: EKG 12-Lead S/p pacemaker Ejection fraction 55% No events on tele  Essential hypertension - Plan: EKG 12-Lead Blood pressure is well controlled on today's visit. No changes made to the medications.  Mixed  hyperlipidemia  On simvastatin Zetia 10 mg daily  Nonischemic cardiomyopathy (HCC) Ejection fraction 20 to 25% Did not tolerate Farxiga Continue metoprolol succinate 25 daily, isosorbide 30 daily, Entresto 49/51 twice daily  spironolactone 25 daily, Lasix 20 daily with extra Lasix for worsening ankle swelling, abdominal distention or shortness of breath He is participating in cardiac rehab  Chronic back pain In a wheelchair  Pacemaker-St.Jude Followed by EP  COPD/cough  Reports chronic sputum production since XRT  Malignant neoplasm of middle lobe of right lung [facility]) Completed radiation treatment Felt to be too unstable for chemotherapy Has follow-up imaging pending  Long discussion concerning getting some of his medications more affordable  Total encounter time more than 25 minutes  Greater than 50% was spent in counseling and coordination of care with the patient    No orders of the defined types were placed in this encounter.    Signed, Esmond Plants, M.D., Ph.D. 07/25/2021  [facility] [place], [place] [phone]

## 2021-07-25 NOTE — Patient Instructions (Addendum)
Medication Instructions:  Lasix 20 mg daily   Take extra 20 mg lasix as needed for:  Ankle swelling Abdominal tightness Worse or difficulty breathing  Call if you take extra lasix 3 times or more a week We would then need to check your renal function  You were given patient assistance application for Eliquis and Kendall Flack out your portion of application Bring in required documentation as listed Have pharmacy print out "out-of-pocket" expense report  Eliquis: [id number] Entresto: [phone]  If you need a refill on your cardiac medications before your next appointment, please call your pharmacy.    Lab work: No new labs needed  Testing/Procedures: No new testing needed  Follow-Up: At [facility], you and your health needs are our priority.  As part of our continuing mission to provide you with exceptional heart care, we have created designated Provider Care Teams.  These Care Teams include your primary Cardiologist (physician) and Advanced Practice Providers (APPs -  Physician Assistants and Nurse Practitioners) who all work together to provide you with the care you need, when you need it.  You will need a follow up appointment in 3 months  Providers on your designated Care Team:   Murray Hodgkins, NP Christell Faith, PA-C Marrianne Mood, PA-C Cadence [place], [place]  COVID-19 Vaccine Information can be found at: ShippingScam.co.uk For questions related to vaccine distribution or appointments, please email [email or web address] or call [phone].

## 2021-07-27 ENCOUNTER — Encounter: Payer: Medicare Other | Admitting: *Deleted

## 2021-07-27 ENCOUNTER — Other Ambulatory Visit: Payer: Self-pay

## 2021-07-27 DIAGNOSIS — I739 Peripheral vascular disease, unspecified: Secondary | ICD-10-CM | POA: Diagnosis not present

## 2021-07-27 DIAGNOSIS — G4733 Obstructive sleep apnea (adult) (pediatric): Secondary | ICD-10-CM | POA: Diagnosis not present

## 2021-07-27 DIAGNOSIS — C3411 Malignant neoplasm of upper lobe, right bronchus or lung: Secondary | ICD-10-CM | POA: Diagnosis not present

## 2021-07-27 DIAGNOSIS — J449 Chronic obstructive pulmonary disease, unspecified: Secondary | ICD-10-CM | POA: Diagnosis not present

## 2021-07-27 DIAGNOSIS — I10 Essential (primary) hypertension: Secondary | ICD-10-CM | POA: Diagnosis not present

## 2021-07-27 DIAGNOSIS — I5042 Chronic combined systolic (congestive) and diastolic (congestive) heart failure: Secondary | ICD-10-CM

## 2021-07-27 DIAGNOSIS — I428 Other cardiomyopathies: Secondary | ICD-10-CM | POA: Diagnosis not present

## 2021-07-27 NOTE — Addendum Note (Signed)
Addended by: Anselm Pancoast on: 07/27/2021 01:15 PM   Modules accepted: Orders

## 2021-07-27 NOTE — Progress Notes (Signed)
Daily Session Note  Patient Details  Name: Howard [name] MRN: [id number] Date of Birth: 03/05/1931 Referring Provider:   Flowsheet Row Cardiac Rehab from 05/24/2021 in [facility] Cardiac and Pulmonary Rehab  Referring Provider Gollan       Encounter Date: 07/27/2021  Check In:  Session Check In - 07/27/21 0931       Check-In   Supervising physician immediately available to respond to emergencies See telemetry face sheet for immediately available ER MD    Location [facility]-Cardiac & Pulmonary Rehab    Staff Present  , RN BSN;Joseph Hood, RCP,RRT,BSRT;Jessica Hawkins, MA, RCEP, CCRP, CCET    Virtual Visit No    Medication changes reported     No    Fall or balance concerns reported    No    Warm-up and Cool-down Performed on first and last piece of equipment    Resistance Training Performed Yes    VAD Patient? No    PAD/SET Patient? No      Pain Assessment   Currently in Pain? No/denies                Social History   Tobacco Use  Smoking Status Former   Packs/day: 1.00   Years: 56.00   Pack years: 56.00   Types: Cigarettes   Quit date: 2011   Years since quitting: 11.6  Smokeless Tobacco Never    Goals Met:  Independence with exercise equipment Exercise tolerated well No report of cardiac concerns or symptoms Strength training completed today  Goals Unmet:  Not Applicable  Comments: Pt able to follow exercise prescription today without complaint.  Will continue to monitor for progression.    Dr. Mark Miller is Medical Director for [facility].  Dr. Fuad Aleskerov is Medical Director for [facility]. 

## 2021-07-30 NOTE — Progress Notes (Signed)
Remote pacemaker transmission.   

## 2021-08-01 ENCOUNTER — Telehealth: Payer: Self-pay | Admitting: Student

## 2021-08-01 ENCOUNTER — Inpatient Hospital Stay: Payer: Medicare Other | Attending: Hospice and Palliative Medicine | Admitting: Hospice and Palliative Medicine

## 2021-08-01 ENCOUNTER — Inpatient Hospital Stay: Payer: Medicare Other | Admitting: Occupational Therapy

## 2021-08-01 ENCOUNTER — Encounter: Payer: Self-pay | Admitting: Hospice and Palliative Medicine

## 2021-08-01 ENCOUNTER — Encounter: Payer: Self-pay | Admitting: *Deleted

## 2021-08-01 VITALS — BP 115/58 | HR 72 | Temp 97.5°F | Resp 17 | Wt 182.0 lb

## 2021-08-01 DIAGNOSIS — G4733 Obstructive sleep apnea (adult) (pediatric): Secondary | ICD-10-CM | POA: Insufficient documentation

## 2021-08-01 DIAGNOSIS — R197 Diarrhea, unspecified: Secondary | ICD-10-CM | POA: Diagnosis not present

## 2021-08-01 DIAGNOSIS — J449 Chronic obstructive pulmonary disease, unspecified: Secondary | ICD-10-CM | POA: Diagnosis not present

## 2021-08-01 DIAGNOSIS — Z87891 Personal history of nicotine dependence: Secondary | ICD-10-CM | POA: Insufficient documentation

## 2021-08-01 DIAGNOSIS — D735 Infarction of spleen: Secondary | ICD-10-CM | POA: Insufficient documentation

## 2021-08-01 DIAGNOSIS — C3411 Malignant neoplasm of upper lobe, right bronchus or lung: Secondary | ICD-10-CM | POA: Diagnosis not present

## 2021-08-01 DIAGNOSIS — R531 Weakness: Secondary | ICD-10-CM

## 2021-08-01 DIAGNOSIS — C3490 Malignant neoplasm of unspecified part of unspecified bronchus or lung: Secondary | ICD-10-CM | POA: Diagnosis not present

## 2021-08-01 DIAGNOSIS — I5042 Chronic combined systolic (congestive) and diastolic (congestive) heart failure: Secondary | ICD-10-CM | POA: Insufficient documentation

## 2021-08-01 DIAGNOSIS — I739 Peripheral vascular disease, unspecified: Secondary | ICD-10-CM | POA: Insufficient documentation

## 2021-08-01 DIAGNOSIS — I10 Essential (primary) hypertension: Secondary | ICD-10-CM | POA: Insufficient documentation

## 2021-08-01 DIAGNOSIS — E119 Type 2 diabetes mellitus without complications: Secondary | ICD-10-CM | POA: Insufficient documentation

## 2021-08-01 DIAGNOSIS — Z515 Encounter for palliative care: Secondary | ICD-10-CM | POA: Diagnosis not present

## 2021-08-01 DIAGNOSIS — I428 Other cardiomyopathies: Secondary | ICD-10-CM | POA: Insufficient documentation

## 2021-08-01 DIAGNOSIS — Z7901 Long term (current) use of anticoagulants: Secondary | ICD-10-CM | POA: Insufficient documentation

## 2021-08-01 DIAGNOSIS — Z79899 Other long term (current) drug therapy: Secondary | ICD-10-CM | POA: Insufficient documentation

## 2021-08-01 NOTE — Progress Notes (Signed)
Cardiac Individual Treatment Plan  Patient Details  Name: Howard [name] MRN: [id number] Date of Birth: 1931-06-12 Referring Provider:   Flowsheet Row Cardiac Rehab from 05/24/2021 in [facility] Cardiac and Pulmonary Rehab  Referring Provider Gollan       Initial Encounter Date:  Flowsheet Row Cardiac Rehab from 05/24/2021 in [facility] Cardiac and Pulmonary Rehab  Date 05/24/21       Visit Diagnosis: Heart failure, systolic and diastolic, chronic (B and E)  Patient's Home Medications on Admission:  Current Outpatient Medications:    acetaminophen (TYLENOL) 500 MG tablet, Take 1,000 mg by mouth every 6 (six) hours as needed for moderate pain., Disp: , Rfl:    albuterol (VENTOLIN HFA) 108 (90 Base) MCG/ACT inhaler, INHALE 2 PUFFS INTO LUNGS EVERY 6 HOURS AS NEEDED FOR WHEEZING OR SHORTNESS OF BREATH, Disp: 8.5 g, Rfl: 6   aspirin EC 81 MG tablet, Take 81 mg by mouth daily., Disp: , Rfl:    cetirizine (ZYRTEC) 10 MG tablet, Take 10 mg by mouth daily., Disp: , Rfl:    Cholecalciferol (VITAMIN D3) 1000 UNITS CAPS, Take 1,000 Units by mouth daily., Disp: , Rfl:    donepezil (ARICEPT) 5 MG tablet, Take 5 mg by mouth daily., Disp: , Rfl:    ELIQUIS 5 MG TABS tablet, TAKE 1 TABLET TWICE A DAY  (SWITCHED FROM PLAVIX), Disp: 60 tablet, Rfl: 11   ezetimibe (ZETIA) 10 MG tablet, TAKE 1 TABLET DAILY, Disp: 90 tablet, Rfl: 2   Fluticasone-Umeclidin-Vilant (TRELEGY ELLIPTA) 100-62.5-25 MCG/INH AEPB, Inhale 1 puff into the lungs daily., Disp: 180 each, Rfl: 3   furosemide (LASIX) 20 MG tablet, Take 1 tablet (20 mg total) by mouth daily., Disp: 30 tablet, Rfl: 5   Iron-Vitamin C (VITRON-C) 65-125 MG TABS, Take 1 tablet by mouth daily., Disp: 30 tablet, Rfl: 2   isosorbide mononitrate (IMDUR) 30 MG 24 hr tablet, TAKE 1 TABLET DAILY, Disp: 90 tablet, Rfl: 2   megestrol (MEGACE) 40 MG tablet, Take 2 tablets (80 mg total) by mouth 2 (two) times daily., Disp: 120 tablet, Rfl: 0   metFORMIN (GLUCOPHAGE) 500 MG tablet, Take  1 tablet (500 mg total) by mouth 2 (two) times daily with a meal., Disp: 180 tablet, Rfl: 1   metoprolol succinate (TOPROL-XL) 25 MG 24 hr tablet, TAKE 1 TABLET AT BEDTIME, Disp: 90 tablet, Rfl: 2   montelukast (SINGULAIR) 10 MG tablet, Take 10 mg by mouth daily as needed (sneezing/allergies.)., Disp: , Rfl:    Multiple Vitamin (MULTIVITAMIN WITH MINERALS) TABS tablet, Take 1 tablet by mouth daily. One-A-Day Multivitamin, Disp: , Rfl:    omeprazole (PRILOSEC) 40 MG capsule, TAKE 1 CAPSULE TWICE DAILY AS NEEDED, Disp: 180 capsule, Rfl: 3   promethazine-dextromethorphan (PROMETHAZINE-DM) 6.25-15 MG/5ML syrup, Take 5 mLs by mouth 3 (three) times daily as needed for cough., Disp: 118 mL, Rfl: 0   sacubitril-valsartan (ENTRESTO) 49-51 MG, Take 1 tablet by mouth 2 (two) times daily., Disp: 180 tablet, Rfl: 3   simvastatin (ZOCOR) 40 MG tablet, TAKE 1 TABLET AT BEDTIME, Disp: 90 tablet, Rfl: 3   spironolactone (ALDACTONE) 25 MG tablet, Take 1 tablet (25 mg total) by mouth daily., Disp: 90 tablet, Rfl: 3  Past Medical History: Past Medical History:  Diagnosis Date   Arthritis    Bell palsy    Bell's palsy 04/12/2015   Cancer [facility])    prostate and skin   Chronic combined systolic and diastolic CHF, NYHA class 1 ([place])    a. 07/2014 Echo: EF 35-40%,  Gr 1 DD.   Complete heart block ([place])    a. 11/2010 s/p SJM 22[address] [facility] PPM, ser# [id number].   Depression    Diabetes mellitus without complication ([place])    Fall 11-10-14   GERD (gastroesophageal reflux disease)    History of prostate cancer    Hyperlipidemia    Hypertension    LBBB (left bundle branch block)    Left-sided Bell's palsy    Lung cancer ([place]) 2016   NICM (nonischemic cardiomyopathy) ([place])    a. 07/2014 Echo: EF 35-40%, mid-apicalanteroseptal DK, Gr 1 DD, mild-mod dil LA.   Non-obstructive CAD    a. 07/2014 Abnl MV;  b. 08/2014 Cath: LM nl, LAD 30p, RI 40p, LCX nl, OM1 40, RCA dominant 30p, 70d-->Med Rx.   Poor balance    Presence of  permanent cardiac pacemaker    Sleep apnea    a. cpap   Vertigo    WPW (Wolff-Parkinson-White syndrome)    a. S/P RFCA 1991.    Tobacco Use: Social History   Tobacco Use  Smoking Status Former   Packs/day: 1.00   Years: 56.00   Pack years: 56.00   Types: Cigarettes   Quit date: 2011   Years since quitting: 11.6  Smokeless Tobacco Never    Labs: Recent Review Scientist, physiological     Labs for ITP Cardiac and Pulmonary Rehab Latest Ref Rng & Units 09/07/2019 12/10/2019 05/08/2020 11/09/2020 11/13/2020   Cholestrol 100 - 199 mg/dL - 136 134 - 130   LDLCALC 0 - 99 mg/dL - 75 66 - 63   HDL >39 mg/dL - 42 52 - 43   Trlycerides 0 - 149 mg/dL - 93 83 - 137   Hemoglobin A1c 4.0 - 5.6 % 7.2(H) 7.5(H) 7.4(H) 7.7(A) -   HCO3 20.0 - 24.0 mEq/L - - - - -   TCO2 0 - 100 mmol/L - - - - -   O2SAT % - - - - -        Exercise Target Goals: Exercise Program Goal: Individual exercise prescription set using results from initial 6 min walk test and THRR while considering  patient's activity barriers and safety.   Exercise Prescription Goal: Initial exercise prescription builds to 30-45 minutes a day of aerobic activity, 2-3 days per week.  Home exercise guidelines will be given to patient during program as part of exercise prescription that the participant will acknowledge.   Education: Aerobic Exercise: - Group verbal and visual presentation on the components of exercise prescription. Introduces F.I.T.T principle from ACSM for exercise prescriptions.  Reviews F.I.T.T. principles of aerobic exercise including progression. Written material given at graduation.   Education: Resistance Exercise: - Group verbal and visual presentation on the components of exercise prescription. Introduces F.I.T.T principle from ACSM for exercise prescriptions  Reviews F.I.T.T. principles of resistance exercise including progression. Written material given at graduation. Flowsheet Row Cardiac Rehab from 07/11/2021 in  [facility] Cardiac and Pulmonary Rehab  Date 06/20/21  Educator [facility]  Instruction Review Code 1- Verbalizes Understanding        Education: Exercise & Equipment Safety: - Individual verbal instruction and demonstration of equipment use and safety with use of the equipment. Flowsheet Row Cardiac Rehab from 07/11/2021 in [facility] Cardiac and Pulmonary Rehab  Date 05/24/21  Educator [facility]  Instruction Review Code 1- Verbalizes Understanding       Education: Exercise Physiology & General Exercise Guidelines: - Group verbal and written instruction with models to review the exercise physiology of  the cardiovascular system and associated critical values. Provides general exercise guidelines with specific guidelines to those with heart or lung disease.    Education: Flexibility, Balance, Mind/Body Relaxation: - Group verbal and visual presentation with interactive activity on the components of exercise prescription. Introduces F.I.T.T principle from ACSM for exercise prescriptions. Reviews F.I.T.T. principles of flexibility and balance exercise training including progression. Also discusses the mind body connection.  Reviews various relaxation techniques to help reduce and manage stress (i.e. Deep breathing, progressive muscle relaxation, and visualization). Balance handout provided to take home. Written material given at graduation. Flowsheet Row Cardiac Rehab from 07/11/2021 in [facility] Cardiac and Pulmonary Rehab  Date 06/27/21  Educator [facility]  Instruction Review Code 1- Verbalizes Understanding       Activity Barriers & Risk Stratification:  Activity Barriers & Cardiac Risk Stratification - 05/14/21 1406       Activity Barriers & Cardiac Risk Stratification   Activity Barriers Back Problems;Joint Problems;Decreased Ventricular Function;Muscular Weakness    Cardiac Risk Stratification High             6 Minute Walk:  6 Minute Walk     Row Name 05/24/21 1531         6 Minute Walk   Distance  590 feet     Walk Time 5 minutes     # of Rest Breaks 2     MPH 1.34     METS 1.1     RPE 17     Perceived Dyspnea  3     VO2 Peak 3.86     Symptoms Yes (comment)     Comments leg pain/cramping due to vascular issue 5/10     Resting HR 90 bpm     Resting BP 126/62     Resting Oxygen Saturation  98 %     Exercise Oxygen Saturation  during 6 min walk 96 %     Max Ex. HR 118 bpm     Max Ex. BP 156/72     2 Minute Post BP 140/70              Oxygen Initial Assessment:   Oxygen Re-Evaluation:   Oxygen Discharge (Final Oxygen Re-Evaluation):   Initial Exercise Prescription:  Initial Exercise Prescription - 05/24/21 1500       Date of Initial Exercise RX and Referring Provider   Date 05/24/21    Referring Provider Gollan      NuStep   Level 1    SPM 80    Minutes 15    METs 1.1      Recumbant Elliptical   Level 1    RPM 50    Minutes 15    METs 1.1      REL-XR   Level 1    Speed 50    Minutes 15    METs 1.1      Track   Laps 10    Minutes 15      Prescription Details   Frequency (times per week) 3    Duration Progress to 30 minutes of continuous aerobic without signs/symptoms of physical distress      Intensity   THRR 40-80% of Max Heartrate 107-124    Ratings of Perceived Exertion 11-13    Perceived Dyspnea 0-4      Progression   Progression Continue to progress workloads to maintain intensity without signs/symptoms of physical distress.      Resistance Training   Training Prescription Yes  Weight 3 lb    Reps 10-15             Perform Capillary Blood Glucose checks as needed.  Exercise Prescription Changes:   Exercise Prescription Changes     Row Name 05/24/21 1600 06/26/21 1500 07/12/21 0800 07/23/21 1000 07/24/21 0800     Response to Exercise   Blood Pressure (Admit) 126/62 134/74 122/62 -- 126/58   Blood Pressure (Exercise) 156/72 126/68 114/58 -- 122/60   Blood Pressure (Exit) 142/70 130/68 104/56 -- 112/54   Heart  Rate (Admit) 90 bpm 89 bpm 78 bpm -- 94 bpm   Heart Rate (Exercise) 118 bpm 111 bpm 101 bpm -- 100 bpm   Heart Rate (Exit) 93 bpm 87 bpm 89 bpm -- 90 bpm   Oxygen Saturation (Admit) 98 % -- -- -- --   Oxygen Saturation (Exercise) 96 % -- -- -- --   Rating of Perceived Exertion (Exercise) 17 12 13  -- 13   Perceived Dyspnea (Exercise) 3 -- -- -- --   Symptoms 5/10 leg cramp/pain fatigue none -- fatigue in legs   Comments -- second full day of exercise -- -- --   Duration -- Progress to 30 minutes of  aerobic without signs/symptoms of physical distress Progress to 30 minutes of  aerobic without signs/symptoms of physical distress -- Progress to 30 minutes of  aerobic without signs/symptoms of physical distress   Intensity -- THRR unchanged THRR unchanged -- THRR unchanged     Progression   Progression -- Continue to progress workloads to maintain intensity without signs/symptoms of physical distress. Continue to progress workloads to maintain intensity without signs/symptoms of physical distress. -- Continue to progress workloads to maintain intensity without signs/symptoms of physical distress.   Average METs -- 1.97 1.96 -- 2.5     Resistance Training   Training Prescription -- Yes Yes -- Yes   Weight -- 3 lb 3 lb -- 3 lb   Reps -- 10-15 10-15 -- 10-15     Interval Training   Interval Training -- No No -- No     NuStep   Level -- 1 3 -- 4   Minutes -- 15 30 -- 15   METs -- 2.5 2 -- 2.5     T5 Nustep   Level -- -- 3 -- --   Minutes -- -- 15 -- --   METs -- -- 1.8 -- --     Track   Laps -- 8 10 -- 2   Minutes -- 15 15 -- 15   METs -- 1.44 1.54 -- --     Home Exercise Plan   Plans to continue exercise at -- -- -- Home (comment)  walking, Youtube staff videos Home (comment)  walking, Youtube staff videos   Frequency -- -- -- Add 2 additional days to program exercise sessions.  Start with 1 Add 2 additional days to program exercise sessions.  Start with 1   Initial Home Exercises  Provided -- -- -- 07/23/21 07/23/21            Exercise Comments:   Exercise Comments     Row Name 06/20/21 1018           Exercise Comments First full day of exercise!  Patient was oriented to gym and equipment including functions, settings, policies, and procedures.  Patient's individual exercise prescription and treatment plan were reviewed.  All starting workloads were established based on the results of the 6 minute walk test done  at initial orientation visit.  The plan for exercise progression was also introduced and progression will be customized based on patient's performance and goals.                Exercise Goals and Review:   Exercise Goals     Row Name 05/24/21 1602             Exercise Goals   Increase Physical Activity Yes       Intervention Provide advice, education, support and counseling about physical activity/exercise needs.;Develop an individualized exercise prescription for aerobic and resistive training based on initial evaluation findings, risk stratification, comorbidities and participant's personal goals.       Expected Outcomes Short Term: Attend rehab on a regular basis to increase amount of physical activity.;Long Term: Add in home exercise to make exercise part of routine and to increase amount of physical activity.;Long Term: Exercising regularly at least 3-5 days a week.       Increase Strength and Stamina Yes       Intervention Provide advice, education, support and counseling about physical activity/exercise needs.;Develop an individualized exercise prescription for aerobic and resistive training based on initial evaluation findings, risk stratification, comorbidities and participant's personal goals.       Expected Outcomes Short Term: Increase workloads from initial exercise prescription for resistance, speed, and METs.;Long Term: Improve cardiorespiratory fitness, muscular endurance and strength as measured by increased METs and functional  capacity (6MWT)       Able to understand and use rate of perceived exertion (RPE) scale Yes       Intervention Provide education and explanation on how to use RPE scale       Expected Outcomes Short Term: Able to use RPE daily in rehab to express subjective intensity level;Long Term:  Able to use RPE to guide intensity level when exercising independently       Able to understand and use Dyspnea scale Yes       Intervention Provide education and explanation on how to use Dyspnea scale       Expected Outcomes Short Term: Able to use Dyspnea scale daily in rehab to express subjective sense of shortness of breath during exertion;Long Term: Able to use Dyspnea scale to guide intensity level when exercising independently       Knowledge and understanding of Target Heart Rate Range (THRR) Yes       Intervention Provide education and explanation of THRR including how the numbers were predicted and where they are located for reference       Expected Outcomes Short Term: Able to state/look up THRR;Short Term: Able to use daily as guideline for intensity in rehab;Long Term: Able to use THRR to govern intensity when exercising independently       Able to check pulse independently Yes       Intervention Provide education and demonstration on how to check pulse in carotid and radial arteries.;Review the importance of being able to check your own pulse for safety during independent exercise       Expected Outcomes Short Term: Able to explain why pulse checking is important during independent exercise;Long Term: Able to check pulse independently and accurately       Understanding of Exercise Prescription Yes       Intervention Provide education, explanation, and written materials on patient's individual exercise prescription       Expected Outcomes Short Term: Able to explain program exercise prescription;Long Term: Able to explain home exercise prescription  to exercise independently                Exercise  Goals Re-Evaluation :  Exercise Goals Re-Evaluation     Row Name 06/20/21 1018 06/26/21 1529 07/12/21 0853 07/23/21 1030 07/24/21 0835     Exercise Goal Re-Evaluation   Exercise Goals Review Increase Physical Activity;Increase Strength and Stamina;Able to understand and use rate of perceived exertion (RPE) scale;Knowledge and understanding of Target Heart Rate Range (THRR);Able to understand and use Dyspnea scale;Able to check pulse independently;Understanding of Exercise Prescription Increase Physical Activity;Increase Strength and Stamina;Understanding of Exercise Prescription Increase Physical Activity;Increase Strength and Stamina;Understanding of Exercise Prescription Increase Physical Activity;Increase Strength and Stamina;Understanding of Exercise Prescription Increase Physical Activity;Increase Strength and Stamina;Understanding of Exercise Prescription   Comments Reviewed RPE and dyspnea scales, THR and program prescription with pt today.  Pt voiced understanding and was given a copy of goals to take home. Perl has completed his first two full days of exercise.  We will continue to encourage good attendance and monitor his progress. Samba is doing well in rehab. He has already increased his level to 3 on the T4 Nustep and tolerated 10 laps on the track. Will continue to monitor. Reviewed home exercise with pt today.  Pt plans to walk or use the Youtube videos for exercise. Start with slow progression and build up tolerance. Reviewed THR, pulse, RPE, sign and symptoms, pulse oximetery and when to call 911 or MD.  Also discussed weather considerations and indoor options.  Pt voiced understanding. [place] is doing well in rehab.  He is is up to 2.5 METs on the NuStep.  He does try to walk each day and usually averages about two laps each day.  We will continue to montior his progress.   Expected Outcomes Short: Use RPE daily to regulate intensity. Long: Follow program prescription in THR. Short: Continue  to attend rehab regularly Long: Continue to follow prescription Short: Continue to increase number of laps as tolerated Long: Continue to build up overall strength and stamina Short: Start with 1 day of exercise at home Long: Continue to exercise independently at home at appropriate prescription Short: Continue to improve on walking LOng: Continue to improve stamina            Discharge Exercise Prescription (Final Exercise Prescription Changes):  Exercise Prescription Changes - 07/24/21 0800       Response to Exercise   Blood Pressure (Admit) 126/58    Blood Pressure (Exercise) 122/60    Blood Pressure (Exit) 112/54    Heart Rate (Admit) 94 bpm    Heart Rate (Exercise) 100 bpm    Heart Rate (Exit) 90 bpm    Rating of Perceived Exertion (Exercise) 13    Symptoms fatigue in legs    Duration Progress to 30 minutes of  aerobic without signs/symptoms of physical distress    Intensity THRR unchanged      Progression   Progression Continue to progress workloads to maintain intensity without signs/symptoms of physical distress.    Average METs 2.5      Resistance Training   Training Prescription Yes    Weight 3 lb    Reps 10-15      Interval Training   Interval Training No      NuStep   Level 4    Minutes 15    METs 2.5      Track   Laps 2    Minutes 15      Home Exercise  Plan   Plans to continue exercise at Home (comment)   walking, Youtube staff videos   Frequency Add 2 additional days to program exercise sessions.   Start with 1   Initial Home Exercises Provided 07/23/21             Nutrition:  Target Goals: Understanding of nutrition guidelines, daily intake of sodium 1500mg , cholesterol 200mg , calories 30% from fat and 7% or less from saturated fats, daily to have 5 or more servings of fruits and vegetables.  Education: All About Nutrition: -Group instruction provided by verbal, written material, interactive activities, discussions, models, and posters to  present general guidelines for heart healthy nutrition including fat, fiber, MyPlate, the role of sodium in heart healthy nutrition, utilization of the nutrition label, and utilization of this knowledge for meal planning. Follow up email sent as well. Written material given at graduation. Flowsheet Row Cardiac Rehab from 07/11/2021 in [facility] Cardiac and Pulmonary Rehab  Date 07/04/21  Educator [facility]  Instruction Review Code 1- Verbalizes Understanding       Biometrics:  Pre Biometrics - 05/24/21 1603       Pre Biometrics   Height 5' 9.5" (1.765 m)    Weight 190 lb (86.2 kg)    BMI (Calculated) 27.67              Nutrition Therapy Plan and Nutrition Goals:  Nutrition Therapy & Goals - 07/23/21 1510       Nutrition Therapy   RD appointment deferred Yes   Migel would like to postpone this until closer to his program ending. He is currently seeing an RD for weight gain. Will continue to follow up.            Nutrition Assessments:  MEDIFICTS Score Key: ?70 Need to make dietary changes  40-70 Heart Healthy Diet ? 40 Therapeutic Level Cholesterol Diet  Flowsheet Row Cardiac Rehab from 05/24/2021 in [facility] Cardiac and Pulmonary Rehab  Picture Your Plate Total Score on Admission 56      Picture Your Plate Scores: <76 Unhealthy dietary pattern with much room for improvement. 41-50 Dietary pattern unlikely to meet recommendations for good health and room for improvement. 51-60 More healthful dietary pattern, with some room for improvement.  >60 Healthy dietary pattern, although there may be some specific behaviors that could be improved.    Nutrition Goals Re-Evaluation:  Nutrition Goals Re-Evaluation     [place] Name 06/27/21 1007 07/23/21 1010           Goals   Current Weight 184 lb (83.5 kg) --      Nutrition Goal Meet with the Dietician. Meet with the Laurie.      Comment Jonus has been going out a lot to eat since his wifes back hurts when she stands. He has set an  appointment with the dietician. Xzavier is currently seeing a dietican outside of rehab. Since he had radiation, he hasn't had an appetite.He still needs to meet with the RD here to establish goals.      Expected Outcome Short: meet with the dietician. Long: maintain a diet that pertains to him. Short: Meet with RD Long: Continue to eat heart healthy diet               Nutrition Goals Discharge (Final Nutrition Goals Re-Evaluation):  Nutrition Goals Re-Evaluation - 07/23/21 1010       Goals   Nutrition Goal Meet with the Dietician.    Comment Kennan is currently seeing a  dietican outside of rehab. Since he had radiation, he hasn't had an appetite.He still needs to meet with the RD here to establish goals.    Expected Outcome Short: Meet with RD Long: Continue to eat heart healthy diet             Psychosocial: Target Goals: Acknowledge presence or absence of significant depression and/or stress, maximize coping skills, provide positive support system. Participant is able to verbalize types and ability to use techniques and skills needed for reducing stress and depression.   Education: Stress, Anxiety, and Depression - Group verbal and visual presentation to define topics covered.  Reviews how body is impacted by stress, anxiety, and depression.  Also discusses healthy ways to reduce stress and to treat/manage anxiety and depression.  Written material given at graduation.   Education: Sleep Hygiene -Provides group verbal and written instruction about how sleep can affect your health.  Define sleep hygiene, discuss sleep cycles and impact of sleep habits. Review good sleep hygiene tips.    Initial Review & Psychosocial Screening:  Initial Psych Review & Screening - 05/14/21 1411       Initial Review   Current issues with Current Stress Concerns    Source of Stress Concerns Unable to participate in former interests or hobbies;Unable to perform yard/household activities;Chronic  Illness      Family Dynamics   Good Support System? Yes   wife     Barriers   Psychosocial barriers to participate in program There are no identifiable barriers or psychosocial needs.;The patient should benefit from training in stress management and relaxation.      Screening Interventions   Interventions Encouraged to exercise;Provide feedback about the scores to participant;To provide support and resources with identified psychosocial needs;Program counselor consult    Expected Outcomes Short Term goal: Utilizing psychosocial counselor, staff and physician to assist with identification of specific Stressors or current issues interfering with healing process. Setting desired goal for each stressor or current issue identified.;Long Term Goal: Stressors or current issues are controlled or eliminated.;Short Term goal: Identification and review with participant of any Quality of Life or Depression concerns found by scoring the questionnaire.;Long Term goal: The participant improves quality of Life and PHQ9 Scores as seen by post scores and/or verbalization of changes             Quality of Life Scores:   Quality of Life - 05/24/21 1611       Quality of Life   Select Quality of Life      Quality of Life Scores   Health/Function Pre 23.9 %    Socioeconomic Pre 30 %    Psych/Spiritual Pre 28.29 %    Family Pre 29.5 %    GLOBAL Pre 26.79 %            Scores of 19 and below usually indicate a poorer quality of life in these areas.  A difference of  2-3 points is a clinically meaningful difference.  A difference of 2-3 points in the total score of the Quality of Life Index has been associated with significant improvement in overall quality of life, self-image, physical symptoms, and general health in studies assessing change in quality of life.  PHQ-9: Recent Review Flowsheet Data     Depression screen [facility] 2/9 05/24/2021 11/08/2020 11/08/2019   Decreased Interest 0 0 0   Down,  Depressed, Hopeless 0 0 0   PHQ - 2 Score 0 0 0   Altered sleeping 0 - -  Tired, decreased energy 0 - -   Change in appetite 0 - -   Feeling bad or failure about yourself  0 - -   Trouble concentrating 0 - -   Moving slowly or fidgety/restless 0 - -   Suicidal thoughts 0 - -   PHQ-9 Score 0 - -      Interpretation of Total Score  Total Score Depression Severity:  1-4 = Minimal depression, 5-9 = Mild depression, 10-14 = Moderate depression, 15-19 = Moderately severe depression, 20-27 = Severe depression   Psychosocial Evaluation and Intervention:  Psychosocial Evaluation - 05/14/21 1416       Psychosocial Evaluation & Interventions   Interventions Encouraged to exercise with the program and follow exercise prescription;Stress management education    Comments Maximus reports doing well. His hearing is a big stressor for him, so he relies on his wife's help in this area as well as managing his health care. He states he is sleeping well now that they have gotten his medication adjusted. He did have some back issues that have caused walking difficulties at times and increased his weakness which has led him to not doing whatever he wants, so he is looking forward to working on his balance and his stamina. He is also starting radiation medication so he is a little hesitant as to how he will feel during treatment.    Expected Outcomes Short; attend Cardiac Rehab for education and Exercise. Long: develop and maintain positive self care habits.    Continue Psychosocial Services  Follow up required by staff             Psychosocial Re-Evaluation:  Psychosocial Re-Evaluation     [place] Name 06/27/21 1006 07/23/21 1009           Psychosocial Re-Evaluation   Current issues with None Identified Current Stress Concerns      Comments Patient reports no issues with their current mental states, sleep, stress, depression or anxiety. Will follow up with patient in a few weeks for any changes. Early  has really good support from his wife of 49 years! He just finished radiation about a month ago and is experiencing some side effects from that, such as fatigue and loss of appetite. Other than that, he denies any other concerns or stresses. He is not on any medication for depression or anxiety. He is seeing his cardiologist this week to get results from his testing and is anxious to hear back.      Expected Outcomes Short: Continue to exercise regularly to support mental health and notify staff of any changes. Long: maintain mental health and well being through teaching of rehab or prescribed medications independently. Short: Continue attendance with rehab to maintain energy levels Long: Continue to utilize exercise for stress management and maintain positive attitude      Interventions Encouraged to attend Cardiac Rehabilitation for the exercise Encouraged to attend Cardiac Rehabilitation for the exercise      Continue Psychosocial Services  Follow up required by staff Follow up required by staff               Psychosocial Discharge (Final Psychosocial Re-Evaluation):  Psychosocial Re-Evaluation - 07/23/21 1009       Psychosocial Re-Evaluation   Current issues with Current Stress Concerns    Comments Early has really good support from his wife of 100 years! He just finished radiation about a month ago and is experiencing some side effects from that, such as fatigue and loss  of appetite. Other than that, he denies any other concerns or stresses. He is not on any medication for depression or anxiety. He is seeing his cardiologist this week to get results from his testing and is anxious to hear back.    Expected Outcomes Short: Continue attendance with rehab to maintain energy levels Long: Continue to utilize exercise for stress management and maintain positive attitude    Interventions Encouraged to attend Cardiac Rehabilitation for the exercise    Continue Psychosocial Services  Follow up  required by staff             Vocational Rehabilitation: Provide vocational rehab assistance to qualifying candidates.   Vocational Rehab Evaluation & Intervention:  Vocational Rehab - 05/14/21 1411       Initial Vocational Rehab Evaluation & Intervention   Assessment shows need for Vocational Rehabilitation No             Education: Education Goals: Education classes will be provided on a variety of topics geared toward better understanding of heart health and risk factor modification. Participant will state understanding/return demonstration of topics presented as noted by education test scores.  Learning Barriers/Preferences:  Learning Barriers/Preferences - 05/14/21 1406       Learning Barriers/Preferences   Learning Barriers Hearing    Learning Preferences Individual Instruction             General Cardiac Education Topics:  AED/CPR: - Group verbal and written instruction with the use of models to demonstrate the basic use of the AED with the basic ABC's of resuscitation.   Anatomy and Cardiac Procedures: - Group verbal and visual presentation and models provide information about basic cardiac anatomy and function. Reviews the testing methods done to diagnose heart disease and the outcomes of the test results. Describes the treatment choices: Medical Management, Angioplasty, or Coronary Bypass Surgery for treating various heart conditions including Myocardial Infarction, Angina, Valve Disease, and Cardiac Arrhythmias.  Written material given at graduation. Flowsheet Row Cardiac Rehab from 07/11/2021 in [facility] Cardiac and Pulmonary Rehab  Date 06/20/21  Educator SB  Instruction Review Code 1- Verbalizes Understanding       Medication Safety: - Group verbal and visual instruction to review commonly prescribed medications for heart and lung disease. Reviews the medication, class of the drug, and side effects. Includes the steps to properly store meds and  maintain the prescription regimen.  Written material given at graduation. Flowsheet Row Cardiac Rehab from 07/11/2021 in [facility] Cardiac and Pulmonary Rehab  Date 07/11/21  Educator SB  Instruction Review Code 1- Verbalizes Understanding       Intimacy: - Group verbal instruction through game format to discuss how heart and lung disease can affect sexual intimacy. Written material given at graduation..   Know Your Numbers and Heart Failure: - Group verbal and visual instruction to discuss disease risk factors for cardiac and pulmonary disease and treatment options.  Reviews associated critical values for Overweight/Obesity, Hypertension, Cholesterol, and Diabetes.  Discusses basics of heart failure: signs/symptoms and treatments.  Introduces Heart Failure Zone chart for action plan for heart failure.  Written material given at graduation.   Infection Prevention: - Provides verbal and written material to individual with discussion of infection control including proper hand washing and proper equipment cleaning during exercise session. Flowsheet Row Cardiac Rehab from 07/11/2021 in [facility] Cardiac and Pulmonary Rehab  Date 05/24/21  Educator AS  Instruction Review Code 1- Verbalizes Understanding       Falls Prevention: - Provides verbal  and written material to individual with discussion of falls prevention and safety. Flowsheet Row Cardiac Rehab from 07/11/2021 in [facility] Cardiac and Pulmonary Rehab  Date 05/24/21  Educator AS  Instruction Review Code 1- Verbalizes Understanding       Other: -Provides group and verbal instruction on various topics (see comments)   Knowledge Questionnaire Score:   Core Components/Risk Factors/Patient Goals at Admission:  Personal Goals and Risk Factors at Admission - 05/24/21 1604       Core Components/Risk Factors/Patient Goals on Admission   Diabetes Yes    Intervention Provide education about signs/symptoms and action to take for  hypo/hyperglycemia.;Provide education about proper nutrition, including hydration, and aerobic/resistive exercise prescription along with prescribed medications to achieve blood glucose in normal ranges: Fasting glucose 65-99 mg/dL    Expected Outcomes Short Term: Participant verbalizes understanding of the signs/symptoms and immediate care of hyper/hypoglycemia, proper foot care and importance of medication, aerobic/resistive exercise and nutrition plan for blood glucose control.;Long Term: Attainment of HbA1C < 7%.    Heart Failure Yes    Intervention Provide a combined exercise and nutrition program that is supplemented with education, support and counseling about heart failure. Directed toward relieving symptoms such as shortness of breath, decreased exercise tolerance, and extremity edema.    Expected Outcomes Improve functional capacity of life;Short term: Attendance in program 2-3 days a week with increased exercise capacity. Reported lower sodium intake. Reported increased fruit and vegetable intake. Reports medication compliance.;Short term: Daily weights obtained and reported for increase. Utilizing diuretic protocols set by physician.;Long term: Adoption of self-care skills and reduction of barriers for early signs and symptoms recognition and intervention leading to self-care maintenance.    Hypertension Yes    Intervention Provide education on lifestyle modifcations including regular physical activity/exercise, weight management, moderate sodium restriction and increased consumption of fresh fruit, vegetables, and low fat dairy, alcohol moderation, and smoking cessation.;Monitor prescription use compliance.    Expected Outcomes Short Term: Continued assessment and intervention until BP is < 140/42mm HG in hypertensive participants. < 130/64mm HG in hypertensive participants with diabetes, heart failure or chronic kidney disease.;Long Term: Maintenance of blood pressure at goal levels.              Education:Diabetes - Individual verbal and written instruction to review signs/symptoms of diabetes, desired ranges of glucose level fasting, after meals and with exercise. Acknowledge that pre and post exercise glucose checks will be done for 3 sessions at entry of program. [place] from 05/14/2021 in [facility] Cardiac and Pulmonary Rehab  Date 05/14/21  Educator [facility]  Instruction Review Code 1- Verbalizes Understanding       Core Components/Risk Factors/Patient Goals Review:   Goals and Risk Factor Review     Row Name 06/27/21 1003 07/23/21 1004           Core Components/Risk Factors/Patient Goals Review   Personal Goals Review Diabetes;Weight Management/Obesity Diabetes;Weight Management/Obesity;Heart Failure      Review Lional has been checking his sugar at home and has been in the 130s. He has had most of his blood sugar checks at [facility] and they have been within normal ranges. He would like to lose a little bit of weight in the program. He would like to reach a weight goal of 170 pounds. Pierre has not been checking his sugars at home and says he will start getting in the habit again. He discussed the importance of checking them and I encouraged him to ask his doctor to  clarify how often he should be checking them. He has been losing weight and seeing a dietician to help him gain weight. He has a scale at home and weighs himself. He knows to look out for sudden weight gain in a short period of time with heart failure. Wife is good support with maintaining his medications.  He has been feeling a little winded, but has an echo tomorrow to assess, and talking to his doctor this week for results.      Expected Outcomes Short: lose some weight. Long: reach a weight goal of 170 pounds. Short: Talk to doctor Long: Continue to manage lifestyle risk factors               Core Components/Risk Factors/Patient Goals at Discharge (Final Review):   Goals and Risk Factor  Review - 07/23/21 1004       Core Components/Risk Factors/Patient Goals Review   Personal Goals Review Diabetes;Weight Management/Obesity;Heart Failure    Review Vipul has not been checking his sugars at home and says he will start getting in the habit again. He discussed the importance of checking them and I encouraged him to ask his doctor to clarify how often he should be checking them. He has been losing weight and seeing a dietician to help him gain weight. He has a scale at home and weighs himself. He knows to look out for sudden weight gain in a short period of time with heart failure. Wife is good support with maintaining his medications.  He has been feeling a little winded, but has an echo tomorrow to assess, and talking to his doctor this week for results.    Expected Outcomes Short: Talk to doctor Long: Continue to manage lifestyle risk factors             ITP Comments:  ITP Comments     Row Name 05/14/21 1404 05/24/21 1617 06/06/21 0750 06/20/21 1017 07/04/21 1200   ITP Comments Initial telephone orientation completed. Diagnosis can be found in [facility] 5/2. EP orientation scheduled for Thursday 6/2 at 2pm. Completed 6MWT and gym orientation. Initial ITP created and sent for review to Dr. Emily Filbert, Medical Director. 30 Day review completed. Medical Director ITP review done, changes made as directed, and signed approval by Medical Director.  new to program First full day of exercise!  Patient was oriented to gym and equipment including functions, settings, policies, and procedures.  Patient's individual exercise prescription and treatment plan were reviewed.  All starting workloads were established based on the results of the 6 minute walk test done at initial orientation visit.  The plan for exercise progression was also introduced and progression will be customized based on patient's performance and goals. 30 Day review completed. Medical Director ITP review done, changes made as  directed, and signed approval by Medical Director.    [place] Name 08/01/21 0825           ITP Comments 30 Day review completed. Medical Director ITP review done, changes made as directed, and signed approval by Medical Director.                Comments:

## 2021-08-01 NOTE — Progress Notes (Signed)
[place]  Telephone:([phone] [phone]   Name: Howard [name] Date: 08/01/2021 MRN: [id number]  DOB: [birth year]  Patient Care Team: Chrismon, [name], PA-C as PCP - General (Family Medicine) [name], MD as PCP - Cardiology (Cardiology) Deboraha Sprang, MD as PCP - Electrophysiology (Cardiology) Dasher, [name], MD as Consulting Physician (Dermatology) Birder Robson, MD as Referring Physician (Ophthalmology) Lucky Cowboy Erskine Squibb, MD as Referring Physician (Vascular Surgery) Carloyn Manner, MD as Referring Physician (Otolaryngology) Noreene Filbert, MD as Referring Physician (Radiation Oncology) Erby Pian, MD as Referring Physician (Specialist)    REASON FOR CONSULTATION: Howard [name] is a 87 y.o. male with multiple medical problems including COPD, OSA on CPAP, NICM with EF of 20 to 25%, CHB status post PPM, PAD status post stenting, history of splenic infarct on chronic anticoagulation, and recurrent stage III adenocarcinoma of the right lung status post XRT.  Patient is currently under surveillance.  Palliative care was consulted up address goals and to manage ongoing symptoms.  SOCIAL HISTORY:     reports that he quit smoking about 11 years ago. His smoking use included cigarettes. He has a 56.00 pack-year smoking history. He has never used smokeless tobacco. He reports that he does not drink alcohol and does not use drugs.  Patient is married and lives at home with his wife of 61 years.  He is retired from the [employer] where he worked as the Sales executive for [employer].  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  ADVANCE DIRECTIVES:  Not on file  CODE STATUS:   PAST MEDICAL HISTORY: Past Medical History:  Diagnosis Date   Arthritis    Bell palsy    Bell's palsy 04/12/2015   Cancer ([place])    prostate and skin   Chronic combined systolic and diastolic CHF, NYHA class 1 ([place])     a. 07/2014 Echo: EF 35-40%, Gr 1 DD.   Complete heart block ([place])    a. 11/2010 s/p SJM 2210 [facility] DC PPM, ser# [id number].   Depression    Diabetes mellitus without complication ([place])    Fall 11-10-14   GERD (gastroesophageal reflux disease)    History of prostate cancer    Hyperlipidemia    Hypertension    LBBB (left bundle branch block)    Left-sided Bell's palsy    Lung cancer ([place]) 2016   NICM (nonischemic cardiomyopathy) ([place])    a. 07/2014 Echo: EF 35-40%, mid-apicalanteroseptal DK, Gr 1 DD, mild-mod dil LA.   Non-obstructive CAD    a. 07/2014 Abnl MV;  b. 08/2014 Cath: LM nl, LAD 30p, RI 40p, LCX nl, OM1 40, RCA dominant 30p, 70d-->Med Rx.   Poor balance    Presence of permanent cardiac pacemaker    Sleep apnea    a. cpap   Vertigo    WPW (Wolff-Parkinson-White syndrome)    a. S/P RFCA 1991.    PAST SURGICAL HISTORY:  Past Surgical History:  Procedure Laterality Date   ABDOMINAL AORTIC ENDOVASCULAR STENT GRAFT  08/25/2019   Procedure: ABDOMINAL AORTIC ENDOVASCULAR STENT GRAFT;  Surgeon: Algernon Huxley, MD;  Location: [facility] ORS;  Service: Vascular;;   ANGIOPLASTY Left 08/25/2019   Procedure: ANGIOPLASTY;  Surgeon: Algernon Huxley, MD;  Location: [facility] ORS;  Service: Vascular;  Laterality: Left;  left SFA and stent placement   APPLICATION OF WOUND VAC Left 06/07/2015   Procedure: APPLICATION OF WOUND VAC;  Surgeon: Forest Gleason  Byrnett, MD;  Location: [facility] OR[facility];  Service: General;  Laterality: Left;  left upper back   [place]  08/26/2014   Single vessel obstructive CAD   CARPAL TUNNEL RELEASE  04-04-15   [facility]   CATARACT EXTRACTION  07-31-11 and 09-18-11   Catheter ablation  1991   for WPW   cervical fusion     CHOLECYSTECTOMY  09-07-14   ENDARTERECTOMY FEMORAL Left 08/25/2019   Procedure: ENDARTERECTOMY FEMORAL;  Surgeon: Algernon Huxley, MD;  Location: [facility] ORS;  Service: Vascular;  Laterality: Left;  common and produndis    ENDOVASCULAR REPAIR/STENT GRAFT  Right 08/25/2019   Procedure: ENDOVASCULAR REPAIR/STENT GRAFT;  Surgeon: Algernon Huxley, MD;  Location: [facility];  Service: Vascular;  Laterality: Right;  renal artery   HAND SURGERY     right 1993; left 2005   [place]le[place] / REPLACE / REMOVE PACEMAKER     INSERTION OF ILIAC STENT Bilateral 08/25/2019   Procedure: INSERTION OF ILIAC STENT;  Surgeon: Algernon Huxley, MD;  Location: [facility];  Service: Vascular;  Laterality: Bilateral;   JOINT REPLACEMENT Left 2013   knee   JOINT REPLACEMENT Right 2004   knee   KNEE SURGERY     left knee 1991 and 1992; right knee [place] N/A 08/26/2014   Procedure: LEFT HEART CATHETERIZATION WITH CORONARY ANGIOGRAM;  Surgeon: [name], MD;  Location: [facility] CATH LAB;  Service: Cardiovascular;  Laterality: N/A;   LOWER EXTREMITY ANGIOGRAPHY Left 08/23/2019   Procedure: Lower Extremity Angiography;  Surgeon: Algernon Huxley, MD;  Location: [facility] CV LAB;  Service: Cardiovascular;  Laterality: Left;   LUMBAR LAMINECTOMY/DECOMPRESSION MICRODISCECTOMY N/A 06/07/2014   Procedure: LUMBAR FOUR TO FIVE LUMBAR LAMINECTOMY/DECOMPRESSION MICRODISCECTOMY 1 LEVEL;  Surgeon: [name], MD;  Location: [place] [facility] NEURO ORS;  Service: Neurosurgery;  Laterality: N/A;   LUNG BIOPSY Right 2016   Dr [name]   MOHS SURGERY     PACEMAKER INSERTION     PPM-- St Jude 11/30/10 by [name]   PPM GENERATOR CHANGEOUT N/A 07/09/2019   Procedure: PPM GENERATOR CHANGEOUT;  Surgeon: Deboraha Sprang, MD;  Location: [place] CV LAB;  Service: Cardiovascular;  Laterality: N/A;   PROSTATE SURGERY     cancer--1998, prostatectomy   REPLACEMENT TOTAL KNEE     2004   RIGHT/LEFT HEART CATH AND CORONARY ANGIOGRAPHY Bilateral 04/13/2021   Procedure: RIGHT/LEFT HEART CATH AND CORONARY ANGIOGRAPHY;  Surgeon: Wellington Hampshire, MD;  Location: [place] CV LAB;  Service: Cardiovascular;  Laterality: Bilateral;   ruptured disc     1962 and 1998   TEE  WITHOUT CARDIOVERSION N/A 09/01/2019   Procedure: TRANSESOPHAGEAL ECHOCARDIOGRAM (TEE);  Surgeon: [name], MD;  Location: [facility] ORS;  Service: Cardiovascular;  Laterality: N/A;   TEMPORARY PACEMAKER N/A 07/09/2019   Procedure: TEMPORARY PACEMAKER;  Surgeon: Deboraha Sprang, MD;  Location: [place] CV LAB;  Service: Cardiovascular;  Laterality: N/A;   TRIGGER FINGER RELEASE  01-24-15   WOUND DEBRIDEMENT Left 06/07/2015   Procedure: DEBRIDEMENT WOUND;  Surgeon: Robert Bellow, MD;  Location: [facility] ORS;  Service: General;  Laterality: Left;  left upper back    HEMATOLOGY/ONCOLOGY HISTORY:  Oncology History   No history exists.    ALLERGIES:  is allergic to sulfa antibiotics.  MEDICATIONS:  Current Outpatient Medications  Medication Sig Dispense Refill   acetaminophen (  TYLENOL) 500 MG tablet Take 1,000 mg by mouth every 6 (six) hours as needed for moderate pain.     albuterol (VENTOLIN HFA) 108 (90 Base) MCG/ACT inhaler INHALE 2 PUFFS INTO LUNGS EVERY 6 HOURS AS NEEDED FOR WHEEZING OR SHORTNESS OF BREATH 8.5 g 6   aspirin EC 81 MG tablet Take 81 mg by mouth daily.     cetirizine (ZYRTEC) 10 MG tablet Take 10 mg by mouth daily.     Cholecalciferol (VITAMIN D3) 1000 UNITS CAPS Take 1,000 Units by mouth daily.     donepezil (ARICEPT) 5 MG tablet Take 5 mg by mouth daily.     ELIQUIS 5 MG TABS tablet TAKE 1 TABLET TWICE A DAY  (SWITCHED FROM PLAVIX) 60 tablet 11   ezetimibe (ZETIA) 10 MG tablet TAKE 1 TABLET DAILY 90 tablet 2   Fluticasone-Umeclidin-Vilant (TRELEGY ELLIPTA) 100-62.5-25 MCG/INH AEPB Inhale 1 puff into the lungs daily. 180 each 3   furosemide (LASIX) 20 MG tablet Take 1 tablet (20 mg total) by mouth daily. 30 tablet 5   Iron-Vitamin C (VITRON-C) 65-125 MG TABS Take 1 tablet by mouth daily. 30 tablet 2   isosorbide mononitrate (IMDUR) 30 MG 24 hr tablet TAKE 1 TABLET DAILY 90 tablet 2   megestrol (MEGACE) 40 MG tablet Take 2 tablets (80 mg total) by mouth 2 (two) times  daily. 120 tablet 0   metFORMIN (GLUCOPHAGE) 500 MG tablet Take 1 tablet (500 mg total) by mouth 2 (two) times daily with a meal. 180 tablet 1   metoprolol succinate (TOPROL-XL) 25 MG 24 hr tablet TAKE 1 TABLET AT BEDTIME 90 tablet 2   montelukast (SINGULAIR) 10 MG tablet Take 10 mg by mouth daily as needed (sneezing/allergies.).     Multiple Vitamin (MULTIVITAMIN WITH MINERALS) TABS tablet Take 1 tablet by mouth daily. One-A-Day Multivitamin     omeprazole (PRILOSEC) 40 MG capsule TAKE 1 CAPSULE TWICE DAILY AS NEEDED 180 capsule 3   promethazine-dextromethorphan (PROMETHAZINE-DM) 6.25-15 MG/5ML syrup Take 5 mLs by mouth 3 (three) times daily as needed for cough. 118 mL 0   sacubitril-valsartan (ENTRESTO) 49-51 MG Take 1 tablet by mouth 2 (two) times daily. 180 tablet 3   simvastatin (ZOCOR) 40 MG tablet TAKE 1 TABLET AT BEDTIME 90 tablet 3   spironolactone (ALDACTONE) 25 MG tablet Take 1 tablet (25 mg total) by mouth daily. 90 tablet 3   No current facility-administered medications for this visit.    VITAL SIGNS: BP (!) 115/58 (Patient Position: Sitting)   Pulse 72   Temp (!) 97.5 F (36.4 C) (Oral)   Resp 17   Wt 182 lb (82.6 kg)   SpO2 94%   BMI 26.88 kg/m  Filed Weights   08/01/21 1023  Weight: 182 lb (82.6 kg)    Estimated body mass index is 26.88 kg/m as calculated from the following:   Height as of 07/25/21: _0  (1.753 m).   Weight as of this encounter: 182 lb (82.6 kg).  LABS: CBC:    Component Value Date/Time   WBC 4.9 07/03/2021 1356   HGB 11.2 (L) 07/03/2021 1356   HGB 13.2 11/13/2020 0833   HCT 34.7 (L) 07/03/2021 1356   HCT 41.4 11/13/2020 0833   PLT 210 07/03/2021 1356   PLT 285 11/13/2020 0833   MCV 82.4 07/03/2021 1356   MCV 79 11/13/2020 0833   MCV 82 11/10/2014 2041   NEUTROABS 2.6 07/03/2021 1356   NEUTROABS 2.8 11/13/2020 0833   NEUTROABS 9.2 (H)  09/06/2014 0801   LYMPHSABS 1.0 07/03/2021 1356   LYMPHSABS 2.1 11/13/2020 0833   LYMPHSABS 1.2  09/06/2014 0801   MONOABS 0.8 07/03/2021 1356   MONOABS 1.3 (H) 09/06/2014 0801   EOSABS 0.5 07/03/2021 1356   EOSABS 0.4 11/13/2020 0833   EOSABS 0.1 09/06/2014 0801   BASOSABS 0.0 07/03/2021 1356   BASOSABS 0.1 11/13/2020 0833   BASOSABS 0.1 09/06/2014 0801   Comprehensive Metabolic Panel:    Component Value Date/Time   NA 134 (L) 07/03/2021 1356   NA 139 11/13/2020 0833   NA 137 11/10/2014 2041   K 4.1 07/03/2021 1356   K 3.9 11/10/2014 2041   CL 102 07/03/2021 1356   CL 104 11/10/2014 2041   CO2 21 (L) 07/03/2021 1356   CO2 24 11/10/2014 2041   BUN 22 07/03/2021 1356   BUN 23 11/13/2020 0833   BUN 13 11/10/2014 2041   CREATININE 1.20 07/03/2021 1356   CREATININE 0.81 09/30/2017 1003   GLUCOSE 106 (H) 07/03/2021 1356   GLUCOSE 154 (H) 11/10/2014 2041   CALCIUM 8.9 07/03/2021 1356   CALCIUM 8.5 11/10/2014 2041   AST 23 07/03/2021 1356   AST 17 09/06/2014 0801   ALT 12 07/03/2021 1356   ALT 22 09/06/2014 0801   ALKPHOS 56 07/03/2021 1356   ALKPHOS 67 09/06/2014 0801   BILITOT 0.8 07/03/2021 1356   BILITOT 0.5 11/13/2020 0833   BILITOT 0.6 09/06/2014 0801   PROT 7.3 07/03/2021 1356   PROT 6.7 11/13/2020 0833   PROT 7.1 09/06/2014 0801   ALBUMIN 3.8 07/03/2021 1356   ALBUMIN 4.2 11/13/2020 0833   ALBUMIN 3.4 09/06/2014 0801    RADIOGRAPHIC STUDIES: DG Chest 2 View  Result Date: 07/03/2021 CLINICAL DATA:  Shortness of breath.  Lung cancer. EXAM: CHEST - 2 VIEW COMPARISON:  07/01/2021 FINDINGS: Patient's known right lower lobe pulmonary lesion is not well demonstrated by x-ray. Architectural distortion noted in both lung bases, similar to prior. No evidence for pulmonary edema or focal airspace consolidation. No pleural effusion. The cardiopericardial silhouette is within normal limits for size. IMPRESSION: 1. No acute cardiopulmonary findings. 2. Patient's known right lower lobe pulmonary lesion not well demonstrated by x-ray. Electronically Signed   By: Misty Stanley  M.D.   On: 07/03/2021 13:59   VAS [place] ABI WITH/WO TBI  Result Date: 07/20/2021  LOWER EXTREMITY DOPPLER STUDY Patient Name:  Howard [name]  Date of Exam:   07/20/2021 Medical Rec #: [id number]       Accession #:    [id number] Date of Birth: [birth year]        Patient Gender: M Patient Age:   89Y Exam Location:  [facility] Procedure:      VAS [place] ABI WITH/WO TBI Referring Phys: [id number] [place] --------------------------------------------------------------------------------  Indications: Rest pain, and peripheral artery disease. High Risk Factors: Hypertension.  Vascular Interventions: 08/23/2019 PTA Rt and Lt EIA and stent Lt SFA. Right CFA                         endarterectomy. Right RA stent. Comparison Study: 05/25/2020 Performing Technologist: Charlane Ferretti RT (R)(VS)  Examination Guidelines: A complete evaluation includes at minimum, Doppler waveform signals and systolic blood pressure reading at the level of bilateral brachial, anterior tibial, and posterior tibial arteries, when vessel segments are accessible. Bilateral testing is considered an integral part of a complete examination. Photoelectric Plethysmograph (PPG) waveforms and toe systolic pressure readings are  included as required and additional duplex testing as needed. Limited examinations for reoccurring indications may be performed as noted.  ABI Findings: +---------+------------------+-----+---------+--------+ Right    Rt Pressure (mmHg)IndexWaveform Comment  +---------+------------------+-----+---------+--------+ Brachial 141                                      +---------+------------------+-----+---------+--------+ ATA      128               0.89 triphasic         +---------+------------------+-----+---------+--------+ PTA      147               1.02 triphasic         +---------+------------------+-----+---------+--------+ Great Toe115               0.80 Normal             +---------+------------------+-----+---------+--------+ +---------+------------------+-----+---------+-------+ Left     Lt Pressure (mmHg)IndexWaveform Comment +---------+------------------+-----+---------+-------+ Brachial 144                                     +---------+------------------+-----+---------+-------+ ATA      163               1.13 triphasic        +---------+------------------+-----+---------+-------+ PTA      198               1.38 triphasic        +---------+------------------+-----+---------+-------+ Great Toe121               0.84 Normal           +---------+------------------+-----+---------+-------+ +-------+-----------+-----------+------------+------------+ ABI/TBIToday's ABIToday's TBIPrevious ABIPrevious TBI +-------+-----------+-----------+------------+------------+ Right  1.02       .80        1.18        .81          +-------+-----------+-----------+------------+------------+ Left   1.38       .94        1.13        .88          +-------+-----------+-----------+------------+------------+ Bilateral ABIs appear essentially unchanged compared to prior study on 05/25/2020. Bilateral TBIs appear essentially unchanged compared to prior study on 05/25/2020.  Summary: Right: Resting right ankle-brachial index is within normal range. No evidence of significant right lower extremity arterial disease. The right toe-brachial index is normal. Left: Resting left ankle-brachial index indicates noncompressible left lower extremity arteries. The left toe-brachial index is normal. *See table(s) above for measurements and observations.  Electronically signed by Leotis Pain MD on 07/20/2021 at 12:21:47 PM.    Final    ECHOCARDIOGRAM COMPLETE  Result Date: 07/24/2021    ECHOCARDIOGRAM REPORT   Patient Name:   Howard [name] Date of Exam: 07/24/2021 Medical Rec #:  [id number]      Height:       69.0 in Accession #:    [id number]     Weight:       179.0 lb Date of  Birth:  1931/10/29       BSA:          1.971 m Patient Age:    76 years       BP:           102/60 mmHg Patient Gender: M  HR:           86 bpm. Exam Location:  [place] Procedure: 2D Echo, Cardiac Doppler and Color Doppler Indications:    I50.20* Unspecified systolic (congestive) heart failure; I42.80                 Non-ischemic cardiomyopathy  History:        Patient has prior history of Echocardiogram examinations, most                 recent 04/05/2021. CHF and Cardiomyopathy, CAD, PAD and COPD,                 Signs/Symptoms:Dyspnea; Risk Factors:Former Smoker, Sleep Apnea                 and Hypertension. Increased dyspnea for past week                 Finished 35 radiation treatments one month ago.  Sonographer:    Pilar Jarvis RDMS, RVT, RDCS Referring Phys: [id number] [place]  Sonographer Comments: Technically challenging study due to limited acoustic windows. IMPRESSIONS  1. Left ventricular ejection fraction, by estimation, is 20 to 25%. The left ventricle has severely decreased function. The left ventricle demonstrates global hypokinesis. There is moderate left ventricular hypertrophy. Left ventricular diastolic parameters are indeterminate.  2. Right ventricular systolic function is normal. The right ventricular size is normal. Tricuspid regurgitation signal is inadequate for assessing PA pressure.  3. Left atrial size was mildly dilated.  4. The mitral valve is degenerative. Mild mitral valve regurgitation.  5. The aortic valve is tricuspid. There is moderate calcification of the aortic valve. There is moderate thickening of the aortic valve. Aortic valve regurgitation is not visualized. Mild to moderate aortic valve sclerosis/calcification is present, without any evidence of aortic stenosis. FINDINGS  Left Ventricle: Left ventricular ejection fraction, by estimation, is 20 to 25%. The left ventricle has severely decreased function. The left ventricle demonstrates global  hypokinesis. The left ventricular internal cavity size was normal in size. There is moderate left ventricular hypertrophy. Left ventricular diastolic parameters are indeterminate. Right Ventricle: The right ventricular size is normal. No increase in right ventricular wall thickness. Right ventricular systolic function is normal. Tricuspid regurgitation signal is inadequate for assessing PA pressure. Left Atrium: Left atrial size was mildly dilated. Right Atrium: Right atrial size was normal in size. Pericardium: There is no evidence of pericardial effusion. Mitral Valve: The mitral valve is degenerative in appearance. There is mild thickening of the mitral valve leaflet(s). There is mild calcification of the mitral valve leaflet(s). Mild mitral valve regurgitation. MV peak gradient, 8.1 mmHg. The mean mitral valve gradient is 3.0 mmHg. Tricuspid Valve: The tricuspid valve is grossly normal. Tricuspid valve regurgitation is not demonstrated. Aortic Valve: The aortic valve is tricuspid. There is moderate calcification of the aortic valve. There is moderate thickening of the aortic valve. Aortic valve regurgitation is not visualized. Mild to moderate aortic valve sclerosis/calcification is present, without any evidence of aortic stenosis. Aortic valve mean gradient measures 3.0 mmHg. Aortic valve peak gradient measures 6.2 mmHg. Aortic valve area, by VTI measures 3.10 cm. Pulmonic Valve: The pulmonic valve was normal in structure. Pulmonic valve regurgitation is not visualized. No evidence of pulmonic stenosis. Aorta: The aortic root and ascending aorta are structurally normal, with no evidence of dilitation. Pulmonary Artery: The pulmonary artery is of normal size. Venous: The inferior vena cava was not well visualized. IAS/Shunts: The interatrial septum was  not well visualized.  LEFT VENTRICLE PLAX 2D LVIDd:         5.20 cm      Diastology LVIDs:         4.40 cm      LV e' medial:    3.37 cm/s LV PW:         1.40 cm       LV E/e' medial:  41.2 LV IVS:        1.50 cm      LV e' lateral:   4.03 cm/s LVOT diam:     2.40 cm      LV E/e' lateral: 34.5 LV SV:         71 LV SV Index:   36 LVOT Area:     4.52 cm  LV Volumes (MOD) LV vol d, MOD A2C: 152.0 ml LV vol d, MOD A4C: 154.0 ml LV vol s, MOD A2C: 114.0 ml LV vol s, MOD A4C: 132.0 ml LV SV MOD A2C:     38.0 ml LV SV MOD A4C:     154.0 ml LV SV MOD BP:      28.2 ml RIGHT VENTRICLE RV Basal diam:  3.90 cm RV S prime:     12.70 cm/s LEFT ATRIUM             Index       RIGHT ATRIUM           Index LA diam:        4.20 cm 2.13 cm/m  RA Area:     17.30 cm LA Vol (A2C):   75.1 ml 38.11 ml/m RA Volume:   47.50 ml  24.10 ml/m LA Vol (A4C):   78.8 ml 39.98 ml/m LA Biplane Vol: 80.2 ml 40.69 ml/m  AORTIC VALVE                   PULMONIC VALVE AV Area (Vmax):    3.25 cm    PV Vmax:       0.74 m/s AV Area (Vmean):   2.94 cm    PV Peak grad:  2.2 mmHg AV Area (VTI):     3.10 cm AV Vmax:           124.00 cm/s AV Vmean:          80.200 cm/s AV VTI:            0.228 m AV Peak Grad:      6.2 mmHg AV Mean Grad:      3.0 mmHg LVOT Vmax:         89.20 cm/s LVOT Vmean:        52.200 cm/s LVOT VTI:          0.156 m LVOT/AV VTI ratio: 0.68  AORTA Ao Root diam: 3.70 cm Ao Asc diam:  3.40 cm Ao Arch diam: 2.5 cm MITRAL VALVE MV Area (PHT): 7.16 cm     SHUNTS MV Area VTI:   1.99 cm     Systemic VTI:  0.16 m MV Peak grad:  8.1 mmHg     Systemic Diam: 2.40 cm MV Mean grad:  3.0 mmHg MV Vmax:       1.42 m/s MV Vmean:      80.2 cm/s MV Decel Time: 106 msec MV E velocity: 139.00 cm/s Nelva Bush MD Electronically signed by Nelva Bush MD Signature Date/Time: 07/24/2021/7:10:54 PM    Final    CUP PACEART REMOTE DEVICE CHECK  Result  Date: 07/07/2021 Scheduled remote reviewed. Normal device function.  Longest AMS 10 sec. No new alerts. Next remote 91 days.  VAS US RENAL ARTERY DUPLEX  Result Date: 07/20/2021 ABDOMINAL VISCERAL Patient Name:  Howard [name]  Date of Exam:   07/20/2021  Medical Rec #: [id number]       Accession #:    [id number] Date of Birth: 1931/09/12        Patient Gender: M Patient Age:   89Y Exam Location:  [facility] Procedure:      VAS US RENAL ARTERY DUPLEX Referring Phys: [id number] [place] -------------------------------------------------------------------------------- Limitations: Air/bowel gas. Comparison Study: 12/10/2019 Performing Technologist: Charlane Ferretti RT (R)(VS)  Examination Guidelines: A complete evaluation includes B-mode imaging, spectral Doppler, color Doppler, and power Doppler as needed of all accessible portions of each vessel. Bilateral testing is considered an integral part of a complete examination. Limited examinations for reoccurring indications may be performed as noted.  Duplex Findings: +----------+--------+--------+------+--------+ MesentericPSV cm/sEDV cm/sPlaqueComments +----------+--------+--------+------+--------+ Aorta Mid    61                          +----------+--------+--------+------+--------+  Endovascular Aortic Repair (EVAR): +----------+----------------+-------------------+-------------------+           Diameter AP (cm)Diameter Trans (cm)Velocities (cm/sec) +----------+----------------+-------------------+-------------------+ Aorta     2.35            2.62               61                  +----------+----------------+-------------------+-------------------+ Right Limb                                                       +----------+----------------+-------------------+-------------------+ Left Limb                                                        +----------+----------------+-------------------+-------------------+  +------------------+--------+--------+-------+ Right Renal ArteryPSV cm/sEDV cm/sComment +------------------+--------+--------+-------+ Proximal             27                   +------------------+--------+--------+-------+ Mid                  53                    +------------------+--------+--------+-------+ Distal               71                   +------------------+--------+--------+-------+ +-----------------+--------+--------+-------+ Left Renal ArteryPSV cm/sEDV cm/sComment +-----------------+--------+--------+-------+ Proximal            41                   +-----------------+--------+--------+-------+ Mid                 48                   +-----------------+--------+--------+-------+ Distal              53                   +-----------------+--------+--------+-------+  +------------------+-----+------------------+-----+  Right Kidney           Left Kidney             +------------------+-----+------------------+-----+ RAR (manual)      1.20 RAR (manual)      .90   +------------------+-----+------------------+-----+ Kidney length (cm)10.40Kidney length (cm)12.40 +------------------+-----+------------------+-----+  Summary: Renal:  Right: Normal size right kidney. No evidence of right renal artery        stenosis. RRV flow present. Possible free fluid along the        posterior border of the right kidney. Left:  Normal size of left kidney. LRV flow present. No evidence of        left renal artery stenosis. Possible left renal cyst        measuring approximately 2.85cm x 1cm x 1.54cm. Mesenteric:  No endo leak identified.  *See table(s) above for measurements and observations.  Diagnosing physician: Leotis Pain MD  Electronically signed by Leotis Pain MD on 07/20/2021 at 12:21:50 PM.    Final     PERFORMANCE STATUS (ECOG) : 2 - Symptomatic, <50% confined to bed  Review of Systems Unless otherwise noted, a complete review of systems is negative.  Physical Exam General: NAD Pulmonary: Unlabored Extremities: no edema, no joint deformities Skin: no rashes Neurological: Weakness but otherwise nonfocal  IMPRESSION: Met with patient and wife.  I introduced palliative care services and attempted to  establish therapeutic rapport.  Since last seen patient in the symptom management clinic, he endorses intermittent diarrhea, which started after patient completed antibiotics for URI.  Diarrhea has been associated with fecal incontinence and patient is wearing depends.  He has been taking Imodium, which alleviates diarrhea.  Diarrhea is nonbloody and not associated with fever or abdominal pain.  No other GI or urinary symptoms.  As patient was previously treated with a course of amoxicillin, will order stool culture and C. difficile PCR.  Patient also endorses progressive weakness without falls.  He has been utilizing cardiac rehab but is felt less stable on his feet.  Discussed with Gwenette Greet who will see patient for rehab screening.  We will also order a walker.  Patient is pending repeat CT in September.  Patient is hopeful that this will result in good report.  Given poor performance status, he has not been felt to be a candidate for chemotherapy.  Will refer to home palliative care.  PLAN: -Continue current scope of treatment -Plan is for repeat CT in September -Stool studies and C. difficile PCR -Consider referral to GI if work-up is negative -Referral for rehab screening -Referral home palliative care -We will benefit from conversation regarding ACP/MOST form -RTC 3-4 weeks   Patient expressed understanding and was in agreement with this plan. He also understands that He can call the clinic at any time with any questions, concerns, or complaints.     Time Total: 30 minutes  Visit consisted of counseling and education dealing with the complex and emotionally intense issues of symptom management and palliative care in the setting of serious and potentially life-threatening illness.Greater than 50%  of this time was spent counseling and coordinating care related to the above assessment and plan.  Signed by: Altha Harm, PhD, NP-C

## 2021-08-01 NOTE — Telephone Encounter (Signed)
Call patient back to speak with his about scheduling a Palliative Consult, and the phone rang a few times and then sounded as if someone picked the phone up and hung it up, no one would answer me.  I then attempted to reach patient's wife on her cell, with no answer - left message with reason for call along with my name and call back number, requesting a return call.

## 2021-08-01 NOTE — Telephone Encounter (Signed)
Spoke with patient and told him that I was calling from [facility] and he said that he has a hard time hearing and requested that I call back  in about 30 minutes and speak with his wife when she returns

## 2021-08-01 NOTE — Therapy (Signed)
[facility] [address] [place], [place], [place], [address] Phone: [phone]   Fax:  [phone]  Occupational Therapy Screen:  Patient Details  Name: Howard [name] MRN: [id number] Date of Birth: 1931-02-02 No data recorded  Encounter Date: 08/01/2021   OT End of Session - 08/01/21 1927     Visit Number 0             Past Medical History:  Diagnosis Date   Arthritis    Bell palsy    Bell's palsy 04/12/2015   Cancer [facility])    prostate and skin   Chronic combined systolic and diastolic CHF, NYHA class 1 ([place])    a. 07/2014 Echo: EF 35-40%, Gr 1 DD.   Complete heart block ([place])    a. 11/2010 s/p SJM [address] [facility] DC PPM, ser# [id number].   Depression    Diabetes mellitus without complication ([place])    Fall 11-10-14   GERD (gastroesophageal reflux disease)    History of prostate cancer    Hyperlipidemia    Hypertension    LBBB (left bundle branch block)    Left-sided Bell's palsy    Lung cancer ([place]) 2016   NICM (nonischemic cardiomyopathy) ([place])    a. 07/2014 Echo: EF 35-40%, mid-apicalanteroseptal DK, Gr 1 DD, mild-mod dil LA.   Non-obstructive CAD    a. 07/2014 Abnl MV;  b. 08/2014 Cath: LM nl, LAD 30p, RI 40p, LCX nl, OM1 40, RCA dominant 30p, 70d-->Med Rx.   Poor balance    Presence of permanent cardiac pacemaker    Sleep apnea    a. cpap   Vertigo    WPW (Wolff-Parkinson-White syndrome)    a. S/P RFCA 1991.    Past Surgical History:  Procedure Laterality Date   ABDOMINAL AORTIC ENDOVASCULAR STENT GRAFT  08/25/2019   Procedure: ABDOMINAL AORTIC ENDOVASCULAR STENT GRAFT;  Surgeon: Algernon Huxley, MD;  Location: [facility] ORS;  Service: Vascular;;   ANGIOPLASTY Left 08/25/2019   Procedure: ANGIOPLASTY;  Surgeon: Algernon Huxley, MD;  Location: [facility] ORS;  Service: Vascular;  Laterality: Left;  left SFA and stent placement   APPLICATION OF WOUND VAC Left 06/07/2015   Procedure: APPLICATION OF WOUND VAC;  Surgeon: Robert Bellow, MD;  Location: [facility] ORS;  Service: General;  Laterality: Left;  left upper back   [place]  08/26/2014   Single vessel obstructive CAD   CARPAL TUNNEL RELEASE  04-04-15   [facility]   CATARACT EXTRACTION  07-31-11 and 09-18-11   Catheter ablation  1991   for WPW   cervical fusion     CHOLECYSTECTOMY  09-07-14   ENDARTERECTOMY FEMORAL Left 08/25/2019   Procedure: ENDARTERECTOMY FEMORAL;  Surgeon: Algernon Huxley, MD;  Location: [facility] ORS;  Service: Vascular;  Laterality: Left;  common and produndis    ENDOVASCULAR REPAIR/STENT GRAFT Right 08/25/2019   Procedure: ENDOVASCULAR REPAIR/STENT GRAFT;  Surgeon: Algernon Huxley, MD;  Location: [facility] ORS;  Service: Vascular;  Laterality: Right;  renal artery   HAND SURGERY     right 1993; left 2005   [place] / REPLACE / REMOVE PACEMAKER     INSERTION OF ILIAC STENT Bilateral 08/25/2019   Procedure: INSERTION OF ILIAC STENT;  Surgeon: Algernon Huxley, MD;  Location: [facility] ORS;  Service: Vascular;  Laterality: Bilateral;   JOINT REPLACEMENT Left 2013   knee   JOINT REPLACEMENT  Right 2004   knee   KNEE SURGERY     left knee 1991 and 1992; right knee 1995   LEFT HEART CATHETERIZATION WITH CORONARY ANGIOGRAM N/A 08/26/2014   Procedure: LEFT HEART CATHETERIZATION WITH CORONARY ANGIOGRAM;  Surgeon: [name], MD;  Location: [facility] CATH LAB;  Service: Cardiovascular;  Laterality: N/A;   LOWER EXTREMITY ANGIOGRAPHY Left 08/23/2019   Procedure: Lower Extremity Angiography;  Surgeon: Algernon Huxley, MD;  Location: [facility] CV LAB;  Service: Cardiovascular;  Laterality: Left;   LUMBAR LAMINECTOMY/DECOMPRESSION MICRODISCECTOMY N/A 06/07/2014   Procedure: LUMBAR FOUR TO FIVE LUMBAR LAMINECTOMY/DECOMPRESSION MICRODISCECTOMY 1 LEVEL;  Surgeon: [name], MD;  Location: [facility] NEURO ORS;  Service: Neurosurgery;  Laterality: N/A;   LUNG BIOPSY Right 2016   Dr [name]   MOHS SURGERY     PACEMAKER INSERTION     PPM-- St Jude  11/30/10 by [name]   PPM GENERATOR CHANGEOUT N/A 07/09/2019   Procedure: PPM GENERATOR CHANGEOUT;  Surgeon: Deboraha Sprang, MD;  Location: [place] CV LAB;  Service: Cardiovascular;  Laterality: N/A;   PROSTATE SURGERY     cancer--1998, prostatectomy   REPLACEMENT TOTAL KNEE     2004   RIGHT/LEFT HEART CATH AND CORONARY ANGIOGRAPHY Bilateral 04/13/2021   Procedure: RIGHT/LEFT HEART CATH AND CORONARY ANGIOGRAPHY;  Surgeon: Wellington Hampshire, MD;  Location: [place] CV LAB;  Service: Cardiovascular;  Laterality: Bilateral;   ruptured disc     1962 and 1998   TEE WITHOUT CARDIOVERSION N/A 09/01/2019   Procedure: TRANSESOPHAGEAL ECHOCARDIOGRAM (TEE);  Surgeon: [name], MD;  Location: [facility] ORS;  Service: Cardiovascular;  Laterality: N/A;   TEMPORARY PACEMAKER N/A 07/09/2019   Procedure: TEMPORARY PACEMAKER;  Surgeon: Deboraha Sprang, MD;  Location: [place] CV LAB;  Service: Cardiovascular;  Laterality: N/A;   TRIGGER FINGER RELEASE  01-24-15   WOUND DEBRIDEMENT Left 06/07/2015   Procedure: DEBRIDEMENT WOUND;  Surgeon: Robert Bellow, MD;  Location: [facility] ORS;  Service: General;  Laterality: Left;  left upper back    There were no vitals filed for this visit.   Subjective Assessment - 08/01/21 1926     Subjective  I am doing okay but since radiation I am just so much weaker , and tired- not able to walk as I did before- I use O2 at night time -did not had any falls - doing 3 x wk Cardiac rehab until end of the month - but I cannot do all the things anymore    Currently in Pain? No/denies                INOTE 08/01/21 Josh Borders NP - IMPRESSION: Met with patient and wife.  I introduced palliative care services and attempted to establish therapeutic rapport.   Since last seen patient in the symptom management clinic, he endorses intermittent diarrhea, which started after patient completed antibiotics for URI.  Diarrhea has been associated with fecal incontinence and  patient is wearing depends.  He has been taking Imodium, which alleviates diarrhea.  Diarrhea is nonbloody and not associated with fever or abdominal pain.  No other GI or urinary symptoms.   As patient was previously treated with a course of amoxicillin, will order stool culture and C. difficile PCR.   Patient also endorses progressive weakness without falls.  He has been utilizing cardiac rehab but is felt less stable on his feet.  Discussed with Gwenette Greet who will see patient for rehab screening.  We will also  order a walker.   Patient is pending repeat CT in September.  Patient is hopeful that this will result in good report.  Given poor performance status, he has not been felt to be a candidate for chemotherapy.  Will refer to home palliative care.   PLAN: -Continue current scope of treatment -Plan is for repeat CT in September -Stool studies and C. difficile PCR -Consider referral to GI if work-up is negative -Referral for rehab screening -Referral home palliative care -We will benefit from conversation regarding ACP/MOST form -RTC 3-4 weeks  OT SCREEN 08/01/21:  Pt refer by palliative care NP for increase weakness. This OT very familiar with pt from outpt in the past. Pt denies falls - but wife fearfull of pt falling because of increase weakness since after radiation- did get walker out today but did not use it yet- report been using  one elbow crutch Discuss with pt with his history of back issues and bilateral LE issues - that walker will support upper body better and be better on his back and be able to walk easier and decrease fall risk- pt in agreement. Pt doing Cardiac rehab at the moment 3 x wk for about hour - but lately can only do Nustep and walk 2 loops- use to do more. Will reach out to cardiac rehab. Per pt and wife he still schedule to do cardiac rehab until end of month. Pt has walk in shower with seat and hand held shower with rails to hold on. Toilet his elevated. Night  light for toilet use at night time and walker can be used. Loose carpets to be picked up - did some but 2 is left  BERG balance test done and pt scored 40/50 - med risk for falling - pt in agreement to use walker at this time  Pt to do the days that his not in cardiac rehab - do 20 min of HEP  day - 2x 10 or 4 x 5 min  -sit<> stand from chair without using hands -side stepping at sink or counter  - walking with walker some    Pt and wife in agreement to request PT eval and tx order for strengthening and balance to be in place when cardiac rehab is ending end of month  Will follow up with pt in cardiac rehab in 2 wks                              Patient will benefit from skilled therapeutic intervention in order to improve the following deficits and impairments:           Visit Diagnosis: Weakness    Problem List Patient Active Problem List   Diagnosis Date Noted   Chronic systolic congestive heart failure (HCC) 07/18/2021   Goals of care, counseling/discussion 04/16/2021   Mediastinal lymphadenopathy 04/16/2021   Heart failure with reduced ejection fraction (HCC)    AAA (abdominal aortic aneurysm) without rupture (HCC) 12/10/2019   Renal artery stenosis (HCC) 12/10/2019   PAD (peripheral artery disease) (HCC) 12/10/2019   Thromboembolism (HCC) 08/30/2019   Atherosclerosis of native arteries of extremity with intermittent claudication (HCC) 08/23/2019   Swelling of limb 08/17/2019   Pain in limb 08/17/2019   NICM (nonischemic cardiomyopathy) (HCC) 07/01/2019   CHF (congestive heart failure) (HCC) 07/01/2019   Malignant neoplasm of right lung (HCC) 04/18/2016   CAD (coronary artery disease) 04/01/2016   Adenocarcinoma (HCC) 04/01/2016  Skin cyst 12/26/2015   GERD (gastroesophageal reflux disease) 06/16/2015   Abscess of back 06/06/2015   Vertigo 03/27/2015   Carpal tunnel syndrome 10/28/2014   Status post cholecystectomy 09/29/2014   Disease of  digestive tract 09/29/2014   Cardiomyopathy ([place]) 09/26/2014   Type 2 diabetes mellitus without complications ([place])    Sleep apnea    Spinal stenosis, lumbar region, with neurogenic claudication 06/07/2014   Lumbar stenosis with neurogenic claudication 06/07/2014   Abnormal gait 08/20/2012   H/O total knee replacement 08/20/2012   Arthritis of knee, degenerative 08/20/2012   Pacemaker-St.Jude 08/03/2012   Cardiac conduction disorder 06/19/2012   Acid reflux 06/18/2012   Nodal rhythm disorder 06/18/2012   Triggering of digit 03/25/2012   Hyperlipidemia 12/23/2011   Essential hypertension 03/23/2011   Atrioventricular block, complete ([place]) 03/23/2011   Complete atrioventricular block ([place]) 03/23/2011    Rosalyn Gess OTR/L,CLT 08/01/2021, 7:28 PM  [facility] [address] [place], [place], [address] Phone: [phone]   Fax:  [phone]  Name: Howard [name] MRN: [id number] Date of Birth: 1931/07/19

## 2021-08-01 NOTE — Progress Notes (Signed)
Patient here for Palliative appointment, concerns of leg weakness, diarrhea incontinence, Imodium barley helping

## 2021-08-02 ENCOUNTER — Telehealth: Payer: Self-pay

## 2021-08-02 NOTE — Progress Notes (Signed)
Chronic Care Management Pharmacy Assistant   Name: Howard [name]  MRN: [id number] DOB: February 01, 1931  Chart Review for Clinical pharmacist on 08/07/2021.  Conditions to be addressed/monitored: CHF, CAD, HTN, HLD, DMII, and Atrioventricular block,Cardiomyopathy,Cardiac conduction disorder,Nodal rhythm disorder, NICM, Atherosclerosis of native arteries of extremity with intermittent claudication, Thromboembolism, AAA,Renal artery stenosis,PAD,Malignant neoplasm right lung,GERD,Carpal tunnel syndrome,Arthritis of knee,Spinal stenosis,Vertigo,Adenocarcinoma  Primary concerns for visit include: None ID   Recent office visits:  02/23/2021 Carles Collet PA-C (PCP Office) Start  HYDROcodone-acetaminophen (NORCO/VICODIN) 5-325 MG tablet; Take 1 tablet by mouth every 8 (eight) hours for 5 days  Recent consult visits:  08/01/2021 Rosalyn Gess OT (Occupational Therapy) No Medication Changes Noted 08/01/2021 Altha Harm NP (Oncology) Referral to home palliative Care 07/27/2021  Renita Papa RN (Cardiac Rehab) No Medication changes Noted 07/25/2021 Dr.Gollan MD (Cardiology) No Medication Changes Noted 08//12/2020 Renita Papa RN (Cardiac Rehab) No Medication changes Noted 07/20/2021 Dr. Lucky Cowboy MD (Vascular Surgery) No medication Changes noted 07/18/2021 Jennet Maduro RD (Dietician) No Medication Changes noted 07/18/2021 Dr.YU MD (Oncology) Start Megestrol 80mg  BID,refer him to establish care with palliative care service 07/18/2021 Dr.Chrystal MD (Radiation Oncology) Follow up in 2- 3 months , No medication changes noted 07/16/2021 [name] RN (Cardiac Rehab) No Medication changes Noted 07/13/2021 Renita Papa RN (Cardiac Rehab) No Medication changes Noted 07/11/2021 Birdie Sons RN (Cardiac Rehab) No Medication changes Noted 07/06/2021 Renita Papa RN (Cardiac Rehab) No Medication changes Noted 07/04/2021 [name] RN (Cardiac Rehab) No Medication changes Noted 07/03/2021  Altha Harm NP (Oncology) Recommend starting daily nutritional supplements such as Ensure Plus or Boost Plus. Referral place for nutritionist, starting nasal steroid such as Flonase daily. 06/27/2021 Birdie Sons RN (Cardiac Rehab) No Medication changes Noted 06/22/2021 Renita Papa RN (Cardiac Rehab) No Medication changes Noted 06/06/2021 Birdie Sons RN (Cardiac Rehab) No Medication changes Noted 06/18/2021-06/18/2021 Dr.Chrystal MD ([facility]) IMRT Treatment 06/11/2021-06/15/2021 Dr.Chrystal MD ([facility]) IMRT Treatment 06/06/2021-06/08/2021 Dr.Chrystal MD ([facility]) IMRT Treatment 06/06/2021 [name] RN (Cardiac Rehab) No Medication changes Noted 06/05/2021 Dr.Chrystal MD ([place]) IMRT Treatment 06/04/2021 Marrianne Mood PA-C (Cardiology) discontinue Farxgia,Schedule repeat echo to reassess EF at 3 to 6 months  06/04/2021 Dr.Chrystal MD ([place]) IMRT Treatment 05/29/2021-06/01/2021 Dr.Chrystal MD ([place]) IMRT Treatment 05/28/2021 Dr. Andre Lefort MD (Pulmonology) follow-up in 3 months with Pulmonology, No medication changes noted 05/28/2021 -Dr.Chrystal MD ([place]) IMRT Treatment 05/24/2021 Azucena Fallen RN (Cardic Rehab) No medication Changes noted 05/22/2021 -05/25/2021 Dr.Chrystal MD ([place]) IMRT Treatment 05/17/2021 Marrianne Mood [facility] (Cardiology) Hold Wilder Glade - we may need to discontinue this medication  05/15/2021 -05/17/2021 Dr.Chrystal MD ([facility]) IMRT Treatment 05/14/2021 Renita Papa RN (Cardiac Rehab) No medication changes noted 05/14/2021 05/01/2021 Dr.Chrystal MD ([facility]) IMRT Treatment 05/07/2021- 05/11/2021  Dr.Chrystal MD ([facility]) IMRT Treatment 05/01/2021 -05/04/2021 Dr.Chrystal MD ([facility]) IMRT Treatment 04/23/2021 Marrianne Mood PA_C (Cardiology) Increase Entresto dose to 49-51 BID,Start Farxiga 10mg  qd,cardiac rehab referral placed, Follow up with Cardiology in 1 month 04/16/2021 Dr.Yu MD  (Oncology) start oral iron supplementation,iron-Vitamin C 65-125 mg 1 tablet daily 04/09/2021 Caitlin's Walker NP (Cardiology) Start lasix 20mg  daily in the morning,Consider cardiac rehab referral at follow up 04/02/2021 Rexene Edison NP (Pulmonology) Begin doxycycline 100 mg twice daily for 7 days Prednisone 20 mg daily for 5 days,Mucinex DM twice daily as needed for cough and congestion., 03/30/2021 Ricard Dillon PT (Physical Therapy) No mediation Changes noted 03/27/2021 Benito Mccreedy CCC-SLP (Speech Pathology) No medication Changes noted. 03/22/2021 Sande Brothers PT (Physical Therapy) No mediation Changes noted 03/22/2021 Benito Mccreedy CCC-SLP (Speech  Pathology) No medication Changes noted. 03/13/2021 Dr. Patsey Berthold MD (Pulmonology) No medication Changes noted,follow up with Pulmonology in 2 to 3 months. 03/08/2021 Dr.Shah MD (Neurology) place a referral for speech therapy ,place a referral for Physical Therapy for imbalance,Start Donepezil (Aricept) 5mg  by mouth every morning, Follow up with Neurology in 4 months   Hospital visits:  Medication Reconciliation was completed by comparing discharge summary, patient's EMR and Pharmacy list, and upon discussion with patient.  Admitted to the hospital on 07/01/2021 due to Acute Sinusitis. Discharge date was 07/01/2021. Discharged from [facility] Urgent [place]?Medications Started at [facility] Discharge:?? -started amoxicillin 500 mg for 7 days started promethazine-dextromethorphan 6.25-15 mg PRN    Medication Changes at Hospital Discharge: -Changed None  Medications Discontinued at Hospital Discharge: -Stopped None  Medications that remain the same after Hospital Discharge:??  -All other medications will remain the same.   Admitted to the hospital on 04/13/2021 due to Heart failure with reduced ejection fraction. Discharge date was 04/13/2021. Discharged from [place]?Medications Started at [facility]  Discharge:?? -started None  Medication Changes at Hospital Discharge: -Changed None  Medications Discontinued at Hospital Discharge: -Stopped None  Medications that remain the same after Hospital Discharge:??  -All other medications will remain the same.      Medications: Outpatient Encounter Medications as of 08/02/2021  Medication Sig   acetaminophen (TYLENOL) 500 MG tablet Take 1,000 mg by mouth every 6 (six) hours as needed for moderate pain.   albuterol (VENTOLIN HFA) 108 (90 Base) MCG/ACT inhaler INHALE 2 PUFFS INTO LUNGS EVERY 6 HOURS AS NEEDED FOR WHEEZING OR SHORTNESS OF BREATH   aspirin EC 81 MG tablet Take 81 mg by mouth daily.   cetirizine (ZYRTEC) 10 MG tablet Take 10 mg by mouth daily.   Cholecalciferol (VITAMIN D3) 1000 UNITS CAPS Take 1,000 Units by mouth daily.   donepezil (ARICEPT) 5 MG tablet Take 5 mg by mouth daily.   ELIQUIS 5 MG TABS tablet TAKE 1 TABLET TWICE A DAY  (SWITCHED FROM PLAVIX)   ezetimibe (ZETIA) 10 MG tablet TAKE 1 TABLET DAILY   Fluticasone-Umeclidin-Vilant (TRELEGY ELLIPTA) 100-62.5-25 MCG/INH AEPB Inhale 1 puff into the lungs daily.   furosemide (LASIX) 20 MG tablet Take 1 tablet (20 mg total) by mouth daily.   Iron-Vitamin C (VITRON-C) 65-125 MG TABS Take 1 tablet by mouth daily.   isosorbide mononitrate (IMDUR) 30 MG 24 hr tablet TAKE 1 TABLET DAILY   megestrol (MEGACE) 40 MG tablet Take 2 tablets (80 mg total) by mouth 2 (two) times daily.   metFORMIN (GLUCOPHAGE) 500 MG tablet Take 1 tablet (500 mg total) by mouth 2 (two) times daily with a meal.   metoprolol succinate (TOPROL-XL) 25 MG 24 hr tablet TAKE 1 TABLET AT BEDTIME   montelukast (SINGULAIR) 10 MG tablet Take 10 mg by mouth daily as needed (sneezing/allergies.).   Multiple Vitamin (MULTIVITAMIN WITH MINERALS) TABS tablet Take 1 tablet by mouth daily. One-A-Day Multivitamin   omeprazole (PRILOSEC) 40 MG capsule TAKE 1 CAPSULE TWICE DAILY AS NEEDED   promethazine-dextromethorphan  (PROMETHAZINE-DM) 6.25-15 MG/5ML syrup Take 5 mLs by mouth 3 (three) times daily as needed for cough.   sacubitril-valsartan (ENTRESTO) 49-51 MG Take 1 tablet by mouth 2 (two) times daily.   simvastatin (ZOCOR) 40 MG tablet TAKE 1 TABLET AT BEDTIME   spironolactone (ALDACTONE) 25 MG tablet Take 1 tablet (25 mg total) by mouth daily.   No facility-administered encounter medications on  file as of 08/02/2021.    Care Gaps: COVID-19 Vaccine Hemoglobin A1c Influenza Vaccine  Star Rating Drugs: Entresto 49-51 mg last filled 06/04/2021 90 day supply at [facility]. Simvastatin 40 mg last filled 07/12/2021 90 day supply at [facility] Metformin 500 mg last filled 07/12/2021 90 day supply at [facility] Medication Fill Gaps: None ID  Patient wife states she would like to cancel patient appointment because she feels the patient does not need a pharmacist due to visiting a doctor every other day.Sent message to scheduler to cancel appointment, and Notified clinical pharmacist.,  [place] Pharmacist Assistant [phone]

## 2021-08-03 ENCOUNTER — Encounter: Payer: Medicare Other | Admitting: *Deleted

## 2021-08-03 ENCOUNTER — Other Ambulatory Visit: Payer: Self-pay

## 2021-08-03 DIAGNOSIS — I10 Essential (primary) hypertension: Secondary | ICD-10-CM | POA: Diagnosis not present

## 2021-08-03 DIAGNOSIS — G4733 Obstructive sleep apnea (adult) (pediatric): Secondary | ICD-10-CM | POA: Diagnosis not present

## 2021-08-03 DIAGNOSIS — I739 Peripheral vascular disease, unspecified: Secondary | ICD-10-CM | POA: Diagnosis not present

## 2021-08-03 DIAGNOSIS — I5042 Chronic combined systolic (congestive) and diastolic (congestive) heart failure: Secondary | ICD-10-CM

## 2021-08-03 DIAGNOSIS — C3411 Malignant neoplasm of upper lobe, right bronchus or lung: Secondary | ICD-10-CM | POA: Diagnosis not present

## 2021-08-03 DIAGNOSIS — J449 Chronic obstructive pulmonary disease, unspecified: Secondary | ICD-10-CM | POA: Diagnosis not present

## 2021-08-03 DIAGNOSIS — I428 Other cardiomyopathies: Secondary | ICD-10-CM | POA: Diagnosis not present

## 2021-08-03 NOTE — Progress Notes (Signed)
Daily Session Note  Patient Details  Name: SOPHIA CUBERO MRN: [id number] Date of Birth: Jan 21, 1931 Referring Provider:   Flowsheet Row Cardiac Rehab from 05/24/2021 in [facility] Cardiac and Pulmonary Rehab  Referring Provider Gollan       Encounter Date: 08/03/2021  Check In:  Session Check In - 08/03/21 1019       Check-In   Supervising physician immediately available to respond to emergencies See telemetry face sheet for immediately available ER MD    Location [facility]-Cardiac & Pulmonary Rehab    Staff Present Heath Lark, RN, BSN, CCRP;Melissa Dimock, RDN, LDN;Jessica [place], MA, RCEP, CCRP, CCET    Virtual Visit No    Medication changes reported     No    Fall or balance concerns reported    No    Warm-up and Cool-down Performed on first and last piece of equipment    Resistance Training Performed Yes    VAD Patient? No    PAD/SET Patient? No      Pain Assessment   Currently in Pain? No/denies                Social History   Tobacco Use  Smoking Status Former   Packs/day: 1.00   Years: 56.00   Pack years: 56.00   Types: Cigarettes   Quit date: 2011   Years since quitting: 11.6  Smokeless Tobacco Never    Goals Met:  Exercise tolerated well No report of cardiac concerns or symptoms.[email or web address]   Goals Unmet:  Not Applicable  Comments: Pt able to follow exercise prescription today without complaint.  Will continue to monitor for progression.    Dr. Emily Filbert is Medical Director for [place].  Dr. Ottie Glazier is Medical Director for [facility] Pulmonary Rehabilitation.

## 2021-08-06 ENCOUNTER — Telehealth: Payer: Self-pay | Admitting: Student

## 2021-08-06 ENCOUNTER — Encounter: Payer: Medicare Other | Admitting: *Deleted

## 2021-08-06 DIAGNOSIS — I428 Other cardiomyopathies: Secondary | ICD-10-CM | POA: Diagnosis not present

## 2021-08-06 DIAGNOSIS — I10 Essential (primary) hypertension: Secondary | ICD-10-CM | POA: Diagnosis not present

## 2021-08-06 DIAGNOSIS — C3411 Malignant neoplasm of upper lobe, right bronchus or lung: Secondary | ICD-10-CM | POA: Diagnosis not present

## 2021-08-06 DIAGNOSIS — I739 Peripheral vascular disease, unspecified: Secondary | ICD-10-CM | POA: Diagnosis not present

## 2021-08-06 DIAGNOSIS — J449 Chronic obstructive pulmonary disease, unspecified: Secondary | ICD-10-CM | POA: Diagnosis not present

## 2021-08-06 DIAGNOSIS — G4733 Obstructive sleep apnea (adult) (pediatric): Secondary | ICD-10-CM | POA: Diagnosis not present

## 2021-08-06 DIAGNOSIS — I5042 Chronic combined systolic (congestive) and diastolic (congestive) heart failure: Secondary | ICD-10-CM

## 2021-08-06 NOTE — Telephone Encounter (Signed)
Spoke with patient's wife regarding the Palliative referral/services and all questions were answered and she was in agreement with scheduling visit.  I have scheduled an In-home Palliative Consult for 08/09/21 @ 9 AM.

## 2021-08-06 NOTE — Progress Notes (Signed)
Daily Session Note  Patient Details  Name: Howard [name] MRN: [id number] Date of Birth: 26-Jul-1931 Referring Provider:   Flowsheet Row Cardiac Rehab from 05/24/2021 in [facility] Cardiac and Pulmonary Rehab  Referring Provider Gollan       Encounter Date: 08/06/2021  Check In:  Session Check In - 08/06/21 1024       Check-In   Supervising physician immediately available to respond to emergencies See telemetry face sheet for immediately available ER MD    Location [facility]-Cardiac & Pulmonary Rehab    Staff Present Renita Papa, RN BSN;Joseph [place], RCP,RRT,BSRT;Kelly [place], [place], ACSM CEP, Exercise Physiologist    Virtual Visit No    Medication changes reported     No    Fall or balance concerns reported    No    Warm-up and Cool-down Performed on first and last piece of equipment    Resistance Training Performed Yes    VAD Patient? No    PAD/SET Patient? No      Pain Assessment   Currently in Pain? No/denies                Social History   Tobacco Use  Smoking Status Former   Packs/day: 1.00   Years: 56.00   Pack years: 56.00   Types: Cigarettes   Quit date: 2011   Years since quitting: 11.6  Smokeless Tobacco Never    Goals Met:  Independence with exercise equipment Exercise tolerated well No report of cardiac concerns or symptoms Strength training completed today  Goals Unmet:  Not Applicable  Comments: Pt able to follow exercise prescription today without complaint.  Will continue to monitor for progression.    Dr. Emily Filbert is Medical Director for [place].  Dr. Ottie Glazier is Medical Director for [facility] Pulmonary Rehabilitation.

## 2021-08-07 ENCOUNTER — Telehealth: Payer: Medicare Other

## 2021-08-07 DIAGNOSIS — R221 Localized swelling, mass and lump, neck: Secondary | ICD-10-CM | POA: Diagnosis not present

## 2021-08-07 DIAGNOSIS — H6122 Impacted cerumen, left ear: Secondary | ICD-10-CM | POA: Diagnosis not present

## 2021-08-07 DIAGNOSIS — H903 Sensorineural hearing loss, bilateral: Secondary | ICD-10-CM | POA: Diagnosis not present

## 2021-08-08 ENCOUNTER — Other Ambulatory Visit: Payer: Self-pay

## 2021-08-08 DIAGNOSIS — J449 Chronic obstructive pulmonary disease, unspecified: Secondary | ICD-10-CM | POA: Diagnosis not present

## 2021-08-08 DIAGNOSIS — I428 Other cardiomyopathies: Secondary | ICD-10-CM | POA: Diagnosis not present

## 2021-08-08 DIAGNOSIS — G4733 Obstructive sleep apnea (adult) (pediatric): Secondary | ICD-10-CM | POA: Diagnosis not present

## 2021-08-08 DIAGNOSIS — I5042 Chronic combined systolic (congestive) and diastolic (congestive) heart failure: Secondary | ICD-10-CM

## 2021-08-08 DIAGNOSIS — I739 Peripheral vascular disease, unspecified: Secondary | ICD-10-CM | POA: Diagnosis not present

## 2021-08-08 DIAGNOSIS — C3411 Malignant neoplasm of upper lobe, right bronchus or lung: Secondary | ICD-10-CM | POA: Diagnosis not present

## 2021-08-08 DIAGNOSIS — I10 Essential (primary) hypertension: Secondary | ICD-10-CM | POA: Diagnosis not present

## 2021-08-08 NOTE — Progress Notes (Signed)
Daily Session Note  Patient Details  Name: Howard [name] MRN: [id number] Date of Birth: [birth year] Referring Provider:   Flowsheet Row Cardiac Rehab from 05/24/2021 in [facility] Cardiac and Pulmonary Rehab  Referring Provider Gollan       Encounter Date: 08/08/2021  Check In:  Session Check In - 08/08/21 0943       Check-In   Supervising physician immediately available to respond to emergencies See telemetry face sheet for immediately available ER MD    Location [facility]-Cardiac & Pulmonary Rehab    Staff Present Birdie Sons, MPA, RN;Laureen Owens Shark, BS, RRT, CPFT;Joseph Tessie Fass, [place]    Virtual Visit No    Medication changes reported     No    Fall or balance concerns reported    No    Warm-up and Cool-down Performed on first and last piece of equipment    Resistance Training Performed Yes    VAD Patient? No    PAD/SET Patient? No      Pain Assessment   Currently in Pain? No/denies                Social History   Tobacco Use  Smoking Status Former   Packs/day: 1.00   Years: 56.00   Pack years: 56.00   Types: Cigarettes   Quit date: 2011   Years since quitting: 11.6  Smokeless Tobacco Never    Goals Met:  Independence with exercise equipment Exercise tolerated well No report of cardiac concerns or symptoms Strength training completed today  Goals Unmet:  Not Applicable  Comments: Pt able to follow exercise prescription today without complaint.  Will continue to monitor for progression.    Dr. Emily Filbert is Medical Director for [place].  Dr. Ottie Glazier is Medical Director for [facility] Pulmonary Rehabilitation.

## 2021-08-09 ENCOUNTER — Other Ambulatory Visit: Payer: Self-pay

## 2021-08-09 ENCOUNTER — Other Ambulatory Visit: Payer: Medicare Other | Admitting: Student

## 2021-08-09 DIAGNOSIS — L02212 Cutaneous abscess of back [any part, except buttock]: Secondary | ICD-10-CM

## 2021-08-09 DIAGNOSIS — R531 Weakness: Secondary | ICD-10-CM

## 2021-08-09 DIAGNOSIS — I5022 Chronic systolic (congestive) heart failure: Secondary | ICD-10-CM | POA: Diagnosis not present

## 2021-08-09 DIAGNOSIS — C342 Malignant neoplasm of middle lobe, bronchus or lung: Secondary | ICD-10-CM | POA: Diagnosis not present

## 2021-08-09 DIAGNOSIS — R0602 Shortness of breath: Secondary | ICD-10-CM | POA: Diagnosis not present

## 2021-08-09 DIAGNOSIS — Z515 Encounter for palliative care: Secondary | ICD-10-CM | POA: Diagnosis not present

## 2021-08-09 NOTE — Progress Notes (Signed)
Therapist, nutritional Palliative Care Consult Note Telephone: [phone]  Fax: [phone]   Date of encounter: 08/09/21 There are other unrelated non-urgent complaints, but due to the busy schedule and the amount of time I've already spent with him, time does not permit me to address these routine issues at today's visit. I've requested another appointment to review these additional issues. PATIENT NAME: Howard [name] [address] [place] [address]   [phone] (home)  DOB: 06/24/1931 MRN: [id number] PRIMARY CARE PROVIDER:    Waverly [name],  [address] [place] [address] [phone]  REFERRING PROVIDER:   Waverly [name] [address] [place],  [place] [address] [phone]  RESPONSIBLE PARTY:    Contact Information     Name Relation Home Work Mobile   Howard [name] Spouse [phone]  [phone]   Howard [name]   [phone]        I met face to face with patient and family in the home. Palliative Care was asked to follow this patient by consultation request of  Chrismon, Jodell Cipro, PA-C to address advance care planning and complex medical decision making. This is the initial visit.                                     ASSESSMENT AND PLAN / RECOMMENDATIONS:   Advance Care Planning/Goals of Care: Goals include to maximize quality of life and symptom management. Patient/health care surrogate gave his/her permission to discuss.Our advance care planning conversation included a discussion about:    The value and importance of advance care planning  Experiences with loved ones who have been seriously ill or have died  Exploration of personal, cultural or spiritual beliefs that might influence medical decisions  Exploration of goals of care in the event of a sudden injury or illness  Identification and preparation of a healthcare agent  Will review and complete MOST form on next visit.  CODE STATUS: Full  Code  Symptom Management/Plan:  Chronic combined diastolic and systolic heart failure-EF 20-25%. Patient is currently receiving cardiac rehab. Continue as able to tolerate. Patient currently wears oxygen 2- 3 lpm at bedtime. Recommend oxygen at 2 to 3 liters throughout the day to maintain sats 92% or greater. Encouraged patient to weigh daily. Continue HF medications as directed. Follow up with cardiology as scheduled.   DME: request Home oxygen concentrator with portability at 2-3 liters via nasal cannula. Patient diagnoses of chronic combined systolic and diastolic heart failure, COPD, right lung cancer.  Oxygen sats 90% at rest, sats dropped to 86% with ambulation, oxygen sats sat 93% with 2 lpm while ambulating.  Generalized weakness-secondary to HF, COPD. Continue cardiac rehab; plans to start PT once cardiac rehab is completed. Recommend using rollator walker for ambulation.   DME: patient requires a rollator walker, light weight. Due to impaired mobility, generalized weakness secondary to his heart failure, COPD.   Back lesion-patient with raised lesion to right lateral back, appears to be abscess. Patient has history of these per wife. Erythema and pus present, warm to and tender to touch. Will send in prescription for cephalexin 500 mg BID x 7 days for skin infection. Apply warm compress to area BID. Patient to follow up with dermatology.   Right lung cancer-patient is s/p radiation. He is to have follow up scan in September and see oncology.   Follow up Palliative Care Visit: Palliative  care will continue to follow for complex medical decision making, advance care planning, and clarification of goals. Return in 6-8 weeks or prn.  I spent 75 minutes providing this consultation. More than 50% of the time in this consultation was spent in counseling and care coordination.   PPS: 50%  HOSPICE ELIGIBILITY/DIAGNOSIS: TBD  Chief Complaint: Palliative Medicine initial consult.  HISTORY  OF PRESENT ILLNESS:  Howard [name] is a 87 y.o. year old male  with chronic combined diastolic and systolic heart failure, COPD, nonischemic cardiomyopathy, T2DM, dx right lung cancer in April 2022; s/p 35 radiation treatments. Follow up in September with oncology and PET scan.    Patient resides home with his wife. He is currently receiving cardiac rehab. He endorses shortness of breath with exertion. He reports some days he does have have any shortness of breath; yesterday he reports was a bad day. He is wearing oxygen at night, 2- 3 lpm. He only has a concentrator, no portable tanks. Oxygen ordered by his pulmonologist. Sleeping well at night.  He does have chronic back pain; states current regimen of acetaminophen is usually effective. He also endorses occasional left leg pain. Patient with generalized weakness, he plans to receive PT once cardiac rehab is complete. He reports a good appetite; he is receiving Megace BID. Patient reports a raised area to his back a couple of days ago. Area has been painful, warm to touch and wife squeezed large amount of pus out of it yesterday. Sleeping well at night.   History obtained from review of EMR, discussion with primary team, and interview with family, facility staff/caregiver and/or [name].  I reviewed available labs, medications, imaging, studies and related documents from the EMR.  Records reviewed and summarized above.   ROS  General: NAD EYES: denies vision changes ENMT: hard of hearing, denies dysphagia Cardiovascular: denies chest pain Pulmonary: denies cough,SOB with exertion Abdomen: endorses good appetite, denies constipation, endorses continence of bowel GU: denies dysuria MSK: weakness,  no falls reported Skin: raised area on right back Neurological: denies pain, denies insomnia Psych: Endorses stable mood Heme/lymph/immuno: denies bruises, abnormal bleeding  Physical Exam: Weight: 180 pounds Pulse 88, resp 22, b/p 142/78,  sats 86% on room air with ambulation Constitutional: NAD General: frail appearing EYES: anicteric sclera, lids intact, no discharge  ENMT: slight hard of hearing, oral mucous membranes moist, dentition intact CV: S1S2, RRR, no LE edema Pulmonary: LCTA, increased work of breathing with exertion, no cough Abdomen: normo-active BS + 4 quadrants, soft and non tender GU: deferred MSK: moves all extremities, ambulatory Skin: raised lesion to right lateral back, approximately 3cm in diameter, warm and tender to touch, bloody drainage observed Neuro: generalized weakness, A & O x 3 Psych: non-anxious affect Hem/lymph/immuno: no widespread bruising CURRENT PROBLEM LIST:  Patient Active Problem List   Diagnosis Date Noted   Chronic systolic congestive heart failure (HCC) 07/18/2021   Goals of care, counseling/discussion 04/16/2021   Mediastinal lymphadenopathy 04/16/2021   Heart failure with reduced ejection fraction (HCC)    AAA (abdominal aortic aneurysm) without rupture ([place]) 12/10/2019   Renal artery stenosis ([place]) 12/10/2019   PAD (peripheral artery disease) ([place]) 12/10/2019   Thromboembolism ([place]) 08/30/2019   Atherosclerosis of native arteries of extremity with intermittent claudication ([place]) 08/23/2019   Swelling of limb 08/17/2019   Pain in limb 08/17/2019   NICM (nonischemic cardiomyopathy) ([place]) 07/01/2019   CHF (congestive heart failure) ([place]) 07/01/2019   Malignant neoplasm of right lung ([place]) 04/18/2016  CAD (coronary artery disease) 04/01/2016   Adenocarcinoma (HCC) 04/01/2016   Skin cyst 12/26/2015   GERD (gastroesophageal reflux disease) 06/16/2015   Abscess of back 06/06/2015   Vertigo 03/27/2015   Carpal tunnel syndrome 10/28/2014   Status post cholecystectomy 09/29/2014   Disease of digestive tract 09/29/2014   Cardiomyopathy (HCC) 09/26/2014   Type 2 diabetes mellitus without complications (HCC)    Sleep apnea    Spinal stenosis, lumbar region, with neurogenic  claudication 06/07/2014   Lumbar stenosis with neurogenic claudication 06/07/2014   Abnormal gait 08/20/2012   H/O total knee replacement 08/20/2012   Arthritis of knee, degenerative 08/20/2012   Pacemaker-St.Jude 08/03/2012   Cardiac conduction disorder 06/19/2012   Acid reflux 06/18/2012   Nodal rhythm disorder 06/18/2012   Triggering of digit 03/25/2012   Hyperlipidemia 12/23/2011   Essential hypertension 03/23/2011   Atrioventricular block, complete (HCC) 03/23/2011   Complete atrioventricular block (HCC) 03/23/2011   PAST MEDICAL HISTORY:  Active Ambulatory Problems    Diagnosis Date Noted   Essential hypertension 03/23/2011   Atrioventricular block, complete (HCC) 03/23/2011   Hyperlipidemia 12/23/2011   Pacemaker-St.Jude 08/03/2012   Spinal stenosis, lumbar region, with neurogenic claudication 06/07/2014   Lumbar stenosis with neurogenic claudication 06/07/2014   Type 2 diabetes mellitus without complications (HCC)    Sleep apnea    Cardiomyopathy (HCC) 09/26/2014   Status post cholecystectomy 09/29/2014   Vertigo 03/27/2015   Abscess of back 06/06/2015   Carpal tunnel syndrome 10/28/2014   Cardiac conduction disorder 06/19/2012   Abnormal gait 08/20/2012   Acid reflux 06/18/2012   H/O total knee replacement 08/20/2012   Arthritis of knee, degenerative 08/20/2012   Nodal rhythm disorder 06/18/2012   Triggering of digit 03/25/2012   GERD (gastroesophageal reflux disease) 06/16/2015   Skin cyst 12/26/2015   CAD (coronary artery disease) 04/01/2016   Adenocarcinoma (HCC) 04/01/2016   Disease of digestive tract 09/29/2014   Complete atrioventricular block (HCC) 03/23/2011   Malignant neoplasm of right lung (HCC) 04/18/2016   NICM (nonischemic cardiomyopathy) (HCC) 07/01/2019   CHF (congestive heart failure) (HCC) 07/01/2019   Swelling of limb 08/17/2019   Pain in limb 08/17/2019   Atherosclerosis of native arteries of extremity with intermittent claudication (HCC)  08/23/2019   Thromboembolism (HCC) 08/30/2019   AAA (abdominal aortic aneurysm) without rupture (HCC) 12/10/2019   Renal artery stenosis (HCC) 12/10/2019   PAD (peripheral artery disease) (HCC) 12/10/2019   Heart failure with reduced ejection fraction (HCC)    Goals of care, counseling/discussion 04/16/2021   Mediastinal lymphadenopathy 04/16/2021   Chronic systolic congestive heart failure (HCC) 07/18/2021   Resolved Ambulatory Problems    Diagnosis Date Noted   Cardiomyopathy (HCC) 09/26/2014   Cellulitis 08/22/2019   Past Medical History:  Diagnosis Date   Arthritis    Bell palsy    Bell's palsy 04/12/2015   Cancer (HCC)    Chronic combined systolic and diastolic CHF, NYHA class 1 (HCC)    Complete heart block (HCC)    Depression    Diabetes mellitus without complication (HCC)    Fall 11-10-14   History of prostate cancer    Hypertension    LBBB (left bundle branch block)    Left-sided Bell's palsy    Lung cancer (HCC) 2016   Poor balance    Presence of permanent cardiac pacemaker    WPW (Wolff-Parkinson-White syndrome)    SOCIAL HX:  Social History   Tobacco Use   Smoking status: Former    Packs/day: 1.00  Years: 56.00    Pack years: 56.00    Types: Cigarettes    Quit date: 2011    Years since quitting: 11.6   Smokeless tobacco: Never  Substance Use Topics   Alcohol use: No   FAMILY HX:  Family History  Problem Relation Age of Onset   Heart attack Mother    Hyperlipidemia Mother    CAD Other    Prostate cancer Neg Hx    Cancer Neg Hx       ALLERGIES:  Allergies  Allergen Reactions   Sulfa Antibiotics Rash     PERTINENT MEDICATIONS:  Outpatient Encounter Medications as of 08/09/2021  Medication Sig   acetaminophen (TYLENOL) 500 MG tablet Take 1,000 mg by mouth every 6 (six) hours as needed for moderate pain.   albuterol (VENTOLIN HFA) 108 (90 Base) MCG/ACT inhaler INHALE 2 PUFFS INTO LUNGS EVERY 6 HOURS AS NEEDED FOR WHEEZING OR SHORTNESS OF  BREATH   aspirin EC 81 MG tablet Take 81 mg by mouth daily.   cetirizine (ZYRTEC) 10 MG tablet Take 10 mg by mouth daily.   Cholecalciferol (VITAMIN D3) 1000 UNITS CAPS Take 1,000 Units by mouth daily.   donepezil (ARICEPT) 5 MG tablet Take 5 mg by mouth daily.   ELIQUIS 5 MG TABS tablet TAKE 1 TABLET TWICE A DAY  (SWITCHED FROM PLAVIX)   ezetimibe (ZETIA) 10 MG tablet TAKE 1 TABLET DAILY   Fluticasone-Umeclidin-Vilant (TRELEGY ELLIPTA) 100-62.5-25 MCG/INH AEPB Inhale 1 puff into the lungs daily.   furosemide (LASIX) 20 MG tablet Take 1 tablet (20 mg total) by mouth daily.   Iron-Vitamin C (VITRON-C) 65-125 MG TABS Take 1 tablet by mouth daily.   isosorbide mononitrate (IMDUR) 30 MG 24 hr tablet TAKE 1 TABLET DAILY   megestrol (MEGACE) 40 MG tablet Take 2 tablets (80 mg total) by mouth 2 (two) times daily.   metFORMIN (GLUCOPHAGE) 500 MG tablet Take 1 tablet (500 mg total) by mouth 2 (two) times daily with a meal.   metoprolol succinate (TOPROL-XL) 25 MG 24 hr tablet TAKE 1 TABLET AT BEDTIME   montelukast (SINGULAIR) 10 MG tablet Take 10 mg by mouth daily as needed (sneezing/allergies.).   Multiple Vitamin (MULTIVITAMIN WITH MINERALS) TABS tablet Take 1 tablet by mouth daily. One-A-Day Multivitamin   omeprazole (PRILOSEC) 40 MG capsule TAKE 1 CAPSULE TWICE DAILY AS NEEDED   promethazine-dextromethorphan (PROMETHAZINE-DM) 6.25-15 MG/5ML syrup Take 5 mLs by mouth 3 (three) times daily as needed for cough.   sacubitril-valsartan (ENTRESTO) 49-51 MG Take 1 tablet by mouth 2 (two) times daily.   simvastatin (ZOCOR) 40 MG tablet TAKE 1 TABLET AT BEDTIME   spironolactone (ALDACTONE) 25 MG tablet Take 1 tablet (25 mg total) by mouth daily.   No facility-administered encounter medications on file as of 08/09/2021.   Thank you for the opportunity to participate in the care of Mr. Luna.  The palliative care team will continue to follow. Please call our office at [phone] if we can be of additional  assistance.   Ezekiel Slocumb, NP   COVID-19 PATIENT SCREENING TOOL Asked and negative response unless otherwise noted:  Have you had symptoms of covid, tested positive or been in contact with someone with symptoms/positive test in the past 5-10 days? No

## 2021-08-10 ENCOUNTER — Encounter: Payer: Medicare Other | Admitting: *Deleted

## 2021-08-10 ENCOUNTER — Other Ambulatory Visit: Payer: Self-pay

## 2021-08-10 DIAGNOSIS — I739 Peripheral vascular disease, unspecified: Secondary | ICD-10-CM | POA: Diagnosis not present

## 2021-08-10 DIAGNOSIS — C3411 Malignant neoplasm of upper lobe, right bronchus or lung: Secondary | ICD-10-CM | POA: Diagnosis not present

## 2021-08-10 DIAGNOSIS — I10 Essential (primary) hypertension: Secondary | ICD-10-CM | POA: Diagnosis not present

## 2021-08-10 DIAGNOSIS — J449 Chronic obstructive pulmonary disease, unspecified: Secondary | ICD-10-CM | POA: Diagnosis not present

## 2021-08-10 DIAGNOSIS — I5042 Chronic combined systolic (congestive) and diastolic (congestive) heart failure: Secondary | ICD-10-CM

## 2021-08-10 DIAGNOSIS — G4733 Obstructive sleep apnea (adult) (pediatric): Secondary | ICD-10-CM | POA: Diagnosis not present

## 2021-08-10 DIAGNOSIS — I428 Other cardiomyopathies: Secondary | ICD-10-CM | POA: Diagnosis not present

## 2021-08-10 DIAGNOSIS — R531 Weakness: Secondary | ICD-10-CM

## 2021-08-10 NOTE — Progress Notes (Signed)
Daily Session Note  Patient Details  Name: Howard [name] MRN: [id number] Date of Birth: 20-Oct-1931 Referring Provider:   Flowsheet Row Cardiac Rehab from 05/24/2021 in [facility] Cardiac and Pulmonary Rehab  Referring Provider Gollan       Encounter Date: 08/10/2021  Check In:  Session Check In - 08/10/21 0945       Check-In   Supervising physician immediately available to respond to emergencies See telemetry face sheet for immediately available ER MD    Location [facility]-Cardiac & Pulmonary Rehab    Staff Present Renita Papa, RN BSN;Joseph Tessie Fass, RCP,RRT,BSRT;Kelly [place], MPA, RN    Virtual Visit No    Medication changes reported     No    Fall or balance concerns reported    No    Warm-up and Cool-down Performed on first and last piece of equipment    Resistance Training Performed Yes    VAD Patient? No    PAD/SET Patient? No      Pain Assessment   Currently in Pain? No/denies                Social History   Tobacco Use  Smoking Status Former   Packs/day: 1.00   Years: 56.00   Pack years: 56.00   Types: Cigarettes   Quit date: 2011   Years since quitting: 11.6  Smokeless Tobacco Never    Goals Met:  Independence with exercise equipment Exercise tolerated well No report of cardiac concerns or symptoms Strength training completed today  Goals Unmet:  Not Applicable  Comments: Pt able to follow exercise prescription today without complaint.  Will continue to monitor for progression.    Dr. Emily Filbert is Medical Director for [place].  Dr. Ottie Glazier is Medical Director for [facility] Pulmonary Rehabilitation.

## 2021-08-14 ENCOUNTER — Telehealth: Payer: Self-pay

## 2021-08-14 LAB — STOOL CULTURE: E coli, Shiga toxin Assay: NEGATIVE

## 2021-08-14 LAB — STOOL CULTURE REFLEX - CMPCXR

## 2021-08-14 LAB — STOOL CULTURE REFLEX - RSASHR

## 2021-08-14 NOTE — Telephone Encounter (Signed)
Returned call to patient's wife who inquired about portable oxygen and rollator walker. Order, Demographics and documentation sent to [facility].

## 2021-08-15 ENCOUNTER — Other Ambulatory Visit: Payer: Self-pay

## 2021-08-15 DIAGNOSIS — G4733 Obstructive sleep apnea (adult) (pediatric): Secondary | ICD-10-CM | POA: Diagnosis not present

## 2021-08-15 DIAGNOSIS — I739 Peripheral vascular disease, unspecified: Secondary | ICD-10-CM | POA: Diagnosis not present

## 2021-08-15 DIAGNOSIS — I428 Other cardiomyopathies: Secondary | ICD-10-CM | POA: Diagnosis not present

## 2021-08-15 DIAGNOSIS — I10 Essential (primary) hypertension: Secondary | ICD-10-CM | POA: Diagnosis not present

## 2021-08-15 DIAGNOSIS — C3411 Malignant neoplasm of upper lobe, right bronchus or lung: Secondary | ICD-10-CM | POA: Diagnosis not present

## 2021-08-15 DIAGNOSIS — I5042 Chronic combined systolic (congestive) and diastolic (congestive) heart failure: Secondary | ICD-10-CM

## 2021-08-15 DIAGNOSIS — J449 Chronic obstructive pulmonary disease, unspecified: Secondary | ICD-10-CM | POA: Diagnosis not present

## 2021-08-15 NOTE — Progress Notes (Signed)
Daily Session Note  Patient Details  Name: Howard [name] MRN: [id number] Date of Birth: [birth year] Referring Provider:   Flowsheet Row Cardiac Rehab from 05/24/2021 in [facility] Cardiac and Pulmonary Rehab  Referring Provider Gollan       Encounter Date: 08/15/2021  Check In:  Session Check In - 08/15/21 1010       Check-In   Supervising physician immediately available to respond to emergencies See telemetry face sheet for immediately available ER MD    Location [facility]-Cardiac & Pulmonary Rehab    Staff Present Birdie Sons, MPA, Elveria Rising, BA, ACSM CEP, Exercise Physiologist;Joseph [place], RCP,RRT,BSRT;Melissa [place], RDN, LDN    Virtual Visit No    Medication changes reported     No    Fall or balance concerns reported    No    Warm-up and Cool-down Performed on first and last piece of equipment    Resistance Training Performed Yes    VAD Patient? No    PAD/SET Patient? No      Pain Assessment   Currently in Pain? No/denies                Social History   Tobacco Use  Smoking Status Former   Packs/day: 1.00   Years: 56.00   Pack years: 56.00   Types: Cigarettes   Quit date: 2011   Years since quitting: 11.6  Smokeless Tobacco Never    Goals Met:  Independence with exercise equipment Exercise tolerated well No report of cardiac concerns or symptoms Strength training completed today  Goals Unmet:  Not Applicable  Comments: Pt able to follow exercise prescription today without complaint.  Will continue to monitor for progression.    Dr. Emily Filbert is Medical Director for [place].  Dr. Ottie Glazier is Medical Director for [facility] Pulmonary Rehabilitation.

## 2021-08-17 ENCOUNTER — Other Ambulatory Visit: Payer: Self-pay

## 2021-08-17 ENCOUNTER — Other Ambulatory Visit: Payer: Self-pay | Admitting: *Deleted

## 2021-08-17 ENCOUNTER — Telehealth: Payer: Self-pay

## 2021-08-17 ENCOUNTER — Encounter: Payer: Medicare Other | Admitting: *Deleted

## 2021-08-17 DIAGNOSIS — C3411 Malignant neoplasm of upper lobe, right bronchus or lung: Secondary | ICD-10-CM | POA: Diagnosis not present

## 2021-08-17 DIAGNOSIS — J449 Chronic obstructive pulmonary disease, unspecified: Secondary | ICD-10-CM | POA: Diagnosis not present

## 2021-08-17 DIAGNOSIS — I5042 Chronic combined systolic (congestive) and diastolic (congestive) heart failure: Secondary | ICD-10-CM

## 2021-08-17 DIAGNOSIS — I10 Essential (primary) hypertension: Secondary | ICD-10-CM | POA: Diagnosis not present

## 2021-08-17 DIAGNOSIS — G4733 Obstructive sleep apnea (adult) (pediatric): Secondary | ICD-10-CM | POA: Diagnosis not present

## 2021-08-17 DIAGNOSIS — I428 Other cardiomyopathies: Secondary | ICD-10-CM | POA: Diagnosis not present

## 2021-08-17 DIAGNOSIS — I739 Peripheral vascular disease, unspecified: Secondary | ICD-10-CM | POA: Diagnosis not present

## 2021-08-17 NOTE — Telephone Encounter (Signed)
Patient requesting refill for Megace order pended for MD approval.

## 2021-08-17 NOTE — Progress Notes (Signed)
Daily Session Note  Patient Details  Name: Howard [name] MRN: [id number] Date of Birth: [birth year] Referring Provider:   Flowsheet Row Cardiac Rehab from 05/24/2021 in [facility] Cardiac and Pulmonary Rehab  Referring Provider Gollan       Encounter Date: 08/17/2021  Check In:  Session Check In - 08/17/21 0939       Check-In   Supervising physician immediately available to respond to emergencies See telemetry face sheet for immediately available ER MD    Location [facility]-Cardiac & Pulmonary Rehab    Staff Present Renita Papa, RN BSN;Joseph [place], RCP,RRT,BSRT;Jessica [place], [place], RCEP, CCRP, CCET    Virtual Visit No    Medication changes reported     No    Fall or balance concerns reported    No    Warm-up and Cool-down Performed on first and last piece of equipment    Resistance Training Performed Yes    VAD Patient? No    PAD/SET Patient? No      Pain Assessment   Currently in Pain? No/denies                Social History   Tobacco Use  Smoking Status Former   Packs/day: 1.00   Years: 56.00   Pack years: 56.00   Types: Cigarettes   Quit date: 2011   Years since quitting: 11.6  Smokeless Tobacco Never    Goals Met:  Independence with exercise equipment Exercise tolerated well No report of concerns or symptoms today Strength training completed today  Goals Unmet:  Not Applicable  Comments: Pt able to follow exercise prescription today without complaint.  Will continue to monitor for progression.    Dr. Emily Filbert is Medical Director for [place].  Dr. Ottie Glazier is Medical Director for [facility] Pulmonary Rehabilitation.

## 2021-08-17 NOTE — Telephone Encounter (Signed)
RN called wife of Corky Blumstein back. She said they have not received rollator or O2 yet.  [facility] reports to this RN that they are waiting on provider signature. Reached out to [employer] and notified wife of update.

## 2021-08-18 MED ORDER — MEGESTROL ACETATE 40 MG PO TABS: 80.0000 mg | ORAL_TABLET | Freq: Two times a day (BID) | ORAL | 0 refills | Status: DC

## 2021-08-20 ENCOUNTER — Encounter: Payer: Medicare Other | Admitting: *Deleted

## 2021-08-20 ENCOUNTER — Other Ambulatory Visit: Payer: Self-pay

## 2021-08-20 DIAGNOSIS — C3411 Malignant neoplasm of upper lobe, right bronchus or lung: Secondary | ICD-10-CM | POA: Diagnosis not present

## 2021-08-20 DIAGNOSIS — I10 Essential (primary) hypertension: Secondary | ICD-10-CM | POA: Diagnosis not present

## 2021-08-20 DIAGNOSIS — G4733 Obstructive sleep apnea (adult) (pediatric): Secondary | ICD-10-CM | POA: Diagnosis not present

## 2021-08-20 DIAGNOSIS — I428 Other cardiomyopathies: Secondary | ICD-10-CM | POA: Diagnosis not present

## 2021-08-20 DIAGNOSIS — I5042 Chronic combined systolic (congestive) and diastolic (congestive) heart failure: Secondary | ICD-10-CM

## 2021-08-20 DIAGNOSIS — J449 Chronic obstructive pulmonary disease, unspecified: Secondary | ICD-10-CM | POA: Diagnosis not present

## 2021-08-20 DIAGNOSIS — I739 Peripheral vascular disease, unspecified: Secondary | ICD-10-CM | POA: Diagnosis not present

## 2021-08-20 NOTE — Progress Notes (Signed)
Daily Session Note  Patient Details  Name: Howard [name] MRN: [id number] Date of Birth: 29-Nov-1931 Referring Provider:   Flowsheet Row Cardiac Rehab from 05/24/2021 in [facility] Cardiac and Pulmonary Rehab  Referring Provider Gollan       Encounter Date: 08/20/2021  Check In:  Session Check In - 08/20/21 1026       Check-In   Supervising physician immediately available to respond to emergencies See telemetry face sheet for immediately available ER MD    Location [facility]-Cardiac & Pulmonary Rehab    Staff Present Renita Papa, RN BSN;Joseph Higgins, RCP,RRT,BSRT;Kelly Knappa, [place], ACSM CEP, Exercise Physiologist    Virtual Visit No    Medication changes reported     No    Fall or balance concerns reported    No    Warm-up and Cool-down Performed on first and last piece of equipment    Resistance Training Performed Yes    VAD Patient? No    PAD/SET Patient? No      Pain Assessment   Currently in Pain? No/denies                Social History   Tobacco Use  Smoking Status Former   Packs/day: 1.00   Years: 56.00   Pack years: 56.00   Types: Cigarettes   Quit date: 2011   Years since quitting: 11.6  Smokeless Tobacco Never    Goals Met:  Independence with exercise equipment Exercise tolerated well No report of concerns or symptoms today Strength training completed today  Goals Unmet:  Not Applicable  Comments: Pt able to follow exercise prescription today without complaint.  Will continue to monitor for progression.    Dr. Emily Filbert is Medical Director for [place].  Dr. Ottie Glazier is Medical Director for [facility] Pulmonary Rehabilitation.

## 2021-08-21 ENCOUNTER — Inpatient Hospital Stay: Payer: Medicare Other

## 2021-08-21 NOTE — Progress Notes (Signed)
Nutrition Follow-up:   Patient with recurrent stage III adenocarcinoma of right lung s/p radiation.    Called and spoke with wife.  Patient does not hear well via telephone.  Wife reports that patient's appetite and interest in food has increased.  Reports that still is eating muffin/pastry and coffee at breakfast, did not want to add milk.  Drinking ensure plus between meals BID.  Lunch yesterday was salad and 1/2 of meat and cheese sub sandwich.  Supper meals maybe lasagna and salad. Last night was 2 cheese dogs.  Likes to go out to eat per wife.  Has been eating some ice cream as a snack.    Continues at heart track programs  Medications: megace  Labs: reviewed  Anthropometrics:   Wife reports stable weight of 180 lb at [facility] yesterday  183 lb on home scale this am but has been running around 180 lb.  180 lb on 7/27  NUTRITION DIAGNOSIS: Inadequate oral intake improving   INTERVENTION:  Continue 350 calorie shake at least BID Continue high calorie, high protein diet to prevent weight loss Wife has contact number    MONITORING, EVALUATION, GOAL: weight trends, intake   NEXT VISIT: Phone call Tuesday, October 4th  Travius Crochet B. Zenia Resides, [place], [place] Registered Dietitian [phone] (mobile)

## 2021-08-22 ENCOUNTER — Other Ambulatory Visit: Payer: Self-pay

## 2021-08-22 ENCOUNTER — Inpatient Hospital Stay: Payer: Medicare Other | Admitting: Occupational Therapy

## 2021-08-22 DIAGNOSIS — I10 Essential (primary) hypertension: Secondary | ICD-10-CM | POA: Diagnosis not present

## 2021-08-22 DIAGNOSIS — C3411 Malignant neoplasm of upper lobe, right bronchus or lung: Secondary | ICD-10-CM | POA: Diagnosis not present

## 2021-08-22 DIAGNOSIS — R531 Weakness: Secondary | ICD-10-CM

## 2021-08-22 DIAGNOSIS — G4733 Obstructive sleep apnea (adult) (pediatric): Secondary | ICD-10-CM | POA: Diagnosis not present

## 2021-08-22 DIAGNOSIS — I739 Peripheral vascular disease, unspecified: Secondary | ICD-10-CM | POA: Diagnosis not present

## 2021-08-22 DIAGNOSIS — J449 Chronic obstructive pulmonary disease, unspecified: Secondary | ICD-10-CM | POA: Diagnosis not present

## 2021-08-22 DIAGNOSIS — I428 Other cardiomyopathies: Secondary | ICD-10-CM | POA: Diagnosis not present

## 2021-08-22 DIAGNOSIS — I5042 Chronic combined systolic (congestive) and diastolic (congestive) heart failure: Secondary | ICD-10-CM

## 2021-08-22 NOTE — Therapy (Signed)
[facility] [address] [place], [place], [place], [address] Phone: [phone]   Fax:  [phone]  Occupational Therapy Screen  Patient Details  Name: Howard [name] MRN: [id number] Date of Birth: 20-Aug-1931 No data recorded  Encounter Date: 08/22/2021   OT End of Session - 08/22/21 1601     Visit Number 0             Past Medical History:  Diagnosis Date   Arthritis    Bell palsy    Bell's palsy 04/12/2015   Cancer [facility])    prostate and skin   Chronic combined systolic and diastolic CHF, NYHA class 1 ([place])    a. 07/2014 Echo: EF 35-40%, Gr 1 DD.   Complete heart block ([place])    a. 11/2010 s/p SJM 2210 [employer] DC PPM, ser# [id number].   Depression    Diabetes mellitus without complication ([place])    Fall 11-10-14   GERD (gastroesophageal reflux disease)    History of prostate cancer    Hyperlipidemia    Hypertension    LBBB (left bundle branch block)    Left-sided Bell's palsy    Lung cancer ([place]) 2016   NICM (nonischemic cardiomyopathy) ([place])    a. 07/2014 Echo: EF 35-40%, mid-apicalanteroseptal DK, Gr 1 DD, mild-mod dil LA.   Non-obstructive CAD    a. 07/2014 Abnl MV;  b. 08/2014 Cath: LM nl, LAD 30p, RI 40p, LCX nl, OM1 40, RCA dominant 30p, 70d-->Med Rx.   Poor balance    Presence of permanent cardiac pacemaker    Sleep apnea    a. cpap   Vertigo    WPW (Wolff-Parkinson-White syndrome)    a. S/P RFCA 1991.    Past Surgical History:  Procedure Laterality Date   ABDOMINAL AORTIC ENDOVASCULAR STENT GRAFT  08/25/2019   Procedure: ABDOMINAL AORTIC ENDOVASCULAR STENT GRAFT;  Surgeon: Algernon Huxley, MD;  Location: [facility] ORS;  Service: Vascular;;   ANGIOPLASTY Left 08/25/2019   Procedure: ANGIOPLASTY;  Surgeon: Algernon Huxley, MD;  Location: [facility] ORS;  Service: Vascular;  Laterality: Left;  left SFA and stent placement   APPLICATION OF WOUND VAC Left 06/07/2015   Procedure: APPLICATION OF WOUND VAC;  Surgeon: Robert Bellow, MD;  Location: [facility] ORS;  Service: General;  Laterality: Left;  left upper back   [place]  08/26/2014   Single vessel obstructive CAD   CARPAL TUNNEL RELEASE  04-04-15   [facility]   CATARACT EXTRACTION  07-31-11 and 09-18-11   Catheter ablation  1991   for WPW   cervical fusion     CHOLECYSTECTOMY  09-07-14   ENDARTERECTOMY FEMORAL Left 08/25/2019   Procedure: ENDARTERECTOMY FEMORAL;  Surgeon: Algernon Huxley, MD;  Location: [facility] ORS;  Service: Vascular;  Laterality: Left;  common and produndis    ENDOVASCULAR REPAIR/STENT GRAFT Right 08/25/2019   Procedure: ENDOVASCULAR REPAIR/STENT GRAFT;  Surgeon: Algernon Huxley, MD;  Location: [facility] ORS;  Service: Vascular;  Laterality: Right;  renal artery   HAND SURGERY     right 1993; left 2005   [place] / REPLACE / REMOVE PACEMAKER     INSERTION OF ILIAC STENT Bilateral 08/25/2019   Procedure: INSERTION OF ILIAC STENT;  Surgeon: Algernon Huxley, MD;  Location: [facility] ORS;  Service: Vascular;  Laterality: Bilateral;   JOINT REPLACEMENT Left 2013   knee   JOINT REPLACEMENT  Right 2004   knee   KNEE SURGERY     left knee 1991 and 1992; right knee 1995   LEFT HEART CATHETERIZATION WITH CORONARY ANGIOGRAM N/A 08/26/2014   Procedure: LEFT HEART CATHETERIZATION WITH CORONARY ANGIOGRAM;  Surgeon: [name], MD;  Location: [facility] CATH LAB;  Service: Cardiovascular;  Laterality: N/A;   LOWER EXTREMITY ANGIOGRAPHY Left 08/23/2019   Procedure: Lower Extremity Angiography;  Surgeon: Algernon Huxley, MD;  Location: [facility] CV LAB;  Service: Cardiovascular;  Laterality: Left;   LUMBAR LAMINECTOMY/DECOMPRESSION MICRODISCECTOMY N/A 06/07/2014   Procedure: LUMBAR FOUR TO FIVE LUMBAR LAMINECTOMY/DECOMPRESSION MICRODISCECTOMY 1 LEVEL;  Surgeon: [name], MD;  Location: [facility] NEURO ORS;  Service: Neurosurgery;  Laterality: N/A;   LUNG BIOPSY Right 2016   Dr [name]   MOHS SURGERY     PACEMAKER INSERTION     PPM-- St Jude  11/30/10 by [name]   PPM GENERATOR CHANGEOUT N/A 07/09/2019   Procedure: PPM GENERATOR CHANGEOUT;  Surgeon: Deboraha Sprang, MD;  Location: [facility] CV LAB;  Service: Cardiovascular;  Laterality: N/A;   PROSTATE SURGERY     cancer--1998, prostatectomy   REPLACEMENT TOTAL KNEE     2004   RIGHT/LEFT HEART CATH AND CORONARY ANGIOGRAPHY Bilateral 04/13/2021   Procedure: RIGHT/LEFT HEART CATH AND CORONARY ANGIOGRAPHY;  Surgeon: Wellington Hampshire, MD;  Location: [place] [facility] CV LAB;  Service: Cardiovascular;  Laterality: Bilateral;   ruptured disc     1962 and 1998   TEE WITHOUT CARDIOVERSION N/A 09/01/2019   Procedure: TRANSESOPHAGEAL ECHOCARDIOGRAM (TEE);  Surgeon: [name], MD;  Location: [facility] ORS;  Service: Cardiovascular;  Laterality: N/A;   TEMPORARY PACEMAKER N/A 07/09/2019   Procedure: TEMPORARY PACEMAKER;  Surgeon: Deboraha Sprang, MD;  Location: [place] CV LAB;  Service: Cardiovascular;  Laterality: N/A;   TRIGGER FINGER RELEASE  01-24-15   WOUND DEBRIDEMENT Left 06/07/2015   Procedure: DEBRIDEMENT WOUND;  Surgeon: Robert Bellow, MD;  Location: [facility] ORS;  Service: General;  Laterality: Left;  left upper back    There were no vitals filed for this visit.   Subjective Assessment - 08/22/21 1557     Subjective  I should get walker today delivered that I can bring - but I think I want to do some PT- only had the large walker with the seat -and could not bring it into cardiac rehab                INOTE 08/01/21 Josh Borders NP - IMPRESSION: Met with patient and wife.  I introduced palliative care services and attempted to establish therapeutic rapport.   Since last seen patient in the symptom management clinic, he endorses intermittent diarrhea, which started after patient completed antibiotics for URI.  Diarrhea has been associated with fecal incontinence and patient is wearing depends.  He has been taking Imodium, which alleviates diarrhea.  Diarrhea is nonbloody and not  associated with fever or abdominal pain.  No other GI or urinary symptoms.   As patient was previously treated with a course of amoxicillin, will order stool culture and C. difficile PCR.   Patient also endorses progressive weakness without falls.  He has been utilizing cardiac rehab but is felt less stable on his feet.  Discussed with Gwenette Greet who will see patient for rehab screening.  We will also order a walker.   Patient is pending repeat CT in September.  Patient is hopeful that this will result in good report.  Given poor performance  status, he has not been felt to be a candidate for chemotherapy.  Will refer to home palliative care.   PLAN: -Continue current scope of treatment -Plan is for repeat CT in September -Stool studies and C. difficile PCR -Consider referral to GI if work-up is negative -Referral for rehab screening -Referral home palliative care -We will benefit from conversation regarding ACP/MOST form -RTC 3-4 weeks   OT SCREEN 08/01/21:  Pt refer by palliative care NP for increase weakness. This OT very familiar with pt from outpt in the past. Pt denies falls - but wife fearfull of pt falling because of increase weakness since after radiation- did get walker out today but did not use it yet- report been using  one elbow crutch Discuss with pt with his history of back issues and bilateral LE issues - that walker will support upper body better and be better on his back and be able to walk easier and decrease fall risk- pt in agreement. Pt doing Cardiac rehab at the moment 3 x wk for about hour - but lately can only do Nustep and walk 2 loops- use to do more. Will reach out to cardiac rehab. Per pt and wife he still schedule to do cardiac rehab until end of month. Pt has walk in shower with seat and hand held shower with rails to hold on. Toilet his elevated. Night light for toilet use at night time and walker can be used. Loose carpets to be picked up - did some but 2 is  left  BERG balance test done and pt scored 40/50 - med risk for falling - pt in agreement to use walker at this time  Pt to do the days that his not in cardiac rehab - do 20 min of HEP  day - 2x 10 or 4 x 5 min  -sit<> stand from chair without using hands -side stepping at sink or counter  - walking with walker some    Pt and wife in agreement to request PT eval and tx order for strengthening and balance to be in place when cardiac rehab is ending end of month  Will follow up with pt in cardiac rehab in 2 wks   OT SCREEN 08/22/21:   Pt refered by palliative care NP about 3 wks ago for increase weakness.- pt seen this date  for another follow up - screened in cardiac rehab- talked to pt and wife. This OT very familiar with pt from outpt in the past. Pt denies falls - but wife and pt report his not walking as much anymore because of fear of falling - should get walker delivered today.   Cont to show increase weakness since after radiation- only had large walker with seat at home and before that used  one elbow crutch.  Discuss 3 wks ago with pt his history of back issues and bilateral LE issues - that a walker will support upper body better and be better on his back and be able to walk easier and decrease fall risk- pt in agreement. Pt  has been doing Cardiac rehab at the moment 3 x wk for about hour - but lately can only do Nustep and  per cardiac staff decline to walk  Talked with cardiac staff to maybe borrow walker from PT gym next door and walk with him if possible .   Pt has walk in shower with seat and hand held shower with rails to hold on. Toilet his elevated. Night light for  toilet use at night time and walker can be used. Loose carpets to be picked up - did some but 2 is left  BERG balance test done 3 wks ago pt scored 40/50 - med risk for falling - pt in agreement to use walker at that time    Pt and wife in agreement to request PT eval and tx order for strengthening and balance  over at [facility] rehab with PT - his familiar with that clinic and PT's - ws seen in the past   Will cont with cardiac rehab until PT eval scheduled                                   Patient will benefit from skilled therapeutic intervention in order to improve the following deficits and impairments:           Visit Diagnosis: Weakness    Problem List Patient Active Problem List   Diagnosis Date Noted   Chronic systolic congestive heart failure ([place]) 07/18/2021   Goals of care, counseling/discussion 04/16/2021   Mediastinal lymphadenopathy 04/16/2021   Heart failure with reduced ejection fraction ([place])    AAA (abdominal aortic aneurysm) without rupture ([place]) 12/10/2019   Renal artery stenosis ([place]) 12/10/2019   PAD (peripheral artery disease) ([place]) 12/10/2019   Thromboembolism ([place]) 08/30/2019   Atherosclerosis of native arteries of extremity with intermittent claudication ([place]) 08/23/2019   Swelling of limb 08/17/2019   Pain in limb 08/17/2019   NICM (nonischemic cardiomyopathy) ([place]) 07/01/2019   CHF (congestive heart failure) ([place]) 07/01/2019   Malignant neoplasm of right lung ([place]) 04/18/2016   CAD (coronary artery disease) 04/01/2016   Adenocarcinoma ([place]) 04/01/2016   Skin cyst 12/26/2015   GERD (gastroesophageal reflux disease) 06/16/2015   Abscess of back 06/06/2015   Vertigo 03/27/2015   Carpal tunnel syndrome 10/28/2014   Status post cholecystectomy 09/29/2014   Disease of digestive tract 09/29/2014   Cardiomyopathy ([place]) 09/26/2014   Type 2 diabetes mellitus without complications ([place])    Sleep apnea    Spinal stenosis, lumbar region, with neurogenic claudication 06/07/2014   Lumbar stenosis with neurogenic claudication 06/07/2014   Abnormal gait 08/20/2012   H/O total knee replacement 08/20/2012   Arthritis of knee, degenerative 08/20/2012   Pacemaker-St.Jude 08/03/2012   Cardiac conduction disorder 06/19/2012   Acid  reflux 06/18/2012   Nodal rhythm disorder 06/18/2012   Triggering of digit 03/25/2012   Hyperlipidemia 12/23/2011   Essential hypertension 03/23/2011   Atrioventricular block, complete ([place]) 03/23/2011   Complete atrioventricular block ([place]) 03/23/2011    Rosalyn Gess OTR/L,CLT 08/22/2021, 4:02 PM  [facility] [address], [place], [place], [address] Phone: [phone]   Fax:  [phone]  Name: JORDI LACKO MRN: [id number] Date of Birth: Jul 16, 1931

## 2021-08-22 NOTE — Progress Notes (Signed)
Daily Session Note  Patient Details  Name: Howard [name] MRN: [id number] Date of Birth: 07-08-1931 Referring Provider:   Flowsheet Row Cardiac Rehab from 05/24/2021 in [facility] Cardiac and Pulmonary Rehab  Referring Provider Gollan       Encounter Date: 08/22/2021  Check In:  Session Check In - 08/22/21 [place]       Check-In   Supervising physician immediately available to respond to emergencies See telemetry face sheet for immediately available ER MD    Location [facility]-Cardiac & Pulmonary Rehab    Staff Present Birdie Sons, MPA, Elveria Rising, BA, ACSM CEP, Exercise Physiologist;Joseph Tessie Fass, [place]    Virtual Visit No    Medication changes reported     No    Fall or balance concerns reported    No    Warm-up and Cool-down Performed on first and last piece of equipment    Resistance Training Performed Yes    VAD Patient? No    PAD/SET Patient? No      Pain Assessment   Currently in Pain? No/denies                Social History   Tobacco Use  Smoking Status Former   Packs/day: 1.00   Years: 56.00   Pack years: 56.00   Types: Cigarettes   Quit date: 2011   Years since quitting: 11.6  Smokeless Tobacco Never    Goals Met:  Independence with exercise equipment Exercise tolerated well No report of concerns or symptoms today Strength training completed today  Goals Unmet:  Not Applicable  Comments: Pt able to follow exercise prescription today without complaint.  Will continue to monitor for progression.    Dr. Emily Filbert is Medical Director for [place].  Dr. Ottie Glazier is Medical Director for [facility] Pulmonary Rehabilitation.

## 2021-08-23 ENCOUNTER — Telehealth: Payer: Self-pay

## 2021-08-23 DIAGNOSIS — R531 Weakness: Secondary | ICD-10-CM

## 2021-08-23 NOTE — Telephone Encounter (Signed)
Per [place] message from [place], [place]: seen pt about 3 wks ago for weakness and risk for falling - pt was doing cardiac rehab 3 x wk - but  checked on him down in cardiac rehab today - pt not walking in there anymore- only doing Nustep - increase fear for falling- request PT eval and tx - Pt and wife in agreement to request PT eval and tx order for strengthening and balance over at  [facility] physical and sports rehab -[address] with PT - his familiar with that clinic and PT's -seen them in the past   Will cont with cardiac rehab until PT eval scheduled - if we can please get referral - thank you- m  Order for PT has been entered in [place].

## 2021-08-24 ENCOUNTER — Other Ambulatory Visit: Payer: Self-pay

## 2021-08-24 ENCOUNTER — Encounter: Payer: Medicare Other | Attending: Cardiovascular Disease

## 2021-08-24 DIAGNOSIS — I5042 Chronic combined systolic (congestive) and diastolic (congestive) heart failure: Secondary | ICD-10-CM | POA: Insufficient documentation

## 2021-08-24 NOTE — Progress Notes (Signed)
Daily Session Note  Patient Details  Name: Howard [name] MRN: [id number] Date of Birth: [birth year] Referring Provider:   Flowsheet Row Cardiac Rehab from 05/24/2021 in [facility] Cardiac and Pulmonary Rehab  Referring Provider Gollan       Encounter Date: 08/24/2021  Check In:  Session Check In - 08/24/21 1001       Check-In   Supervising physician immediately available to respond to emergencies See telemetry face sheet for immediately available ER MD    Location [facility]-Cardiac & Pulmonary Rehab    Staff Present Birdie Sons, MPA, RN;Jessica [place], MA, RCEP, CCRP, CCET;Joseph Zwingle, [place]    Virtual Visit No    Medication changes reported     No    Fall or balance concerns reported    No    Warm-up and Cool-down Performed on first and last piece of equipment    Resistance Training Performed Yes    VAD Patient? No    PAD/SET Patient? No      Pain Assessment   Currently in Pain? No/denies                Social History   Tobacco Use  Smoking Status Former   Packs/day: 1.00   Years: 56.00   Pack years: 56.00   Types: Cigarettes   Quit date: 2011   Years since quitting: 11.6  Smokeless Tobacco Never    Goals Met:  Independence with exercise equipment Exercise tolerated well No report of concerns or symptoms today Strength training completed today  Goals Unmet:  Not Applicable  Comments: Pt able to follow exercise prescription today without complaint.  Will continue to monitor for progression.    Dr. Emily Filbert is Medical Director for [place].  Dr. Ottie Glazier is Medical Director for [facility] Pulmonary Rehabilitation.

## 2021-08-27 ENCOUNTER — Other Ambulatory Visit (INDEPENDENT_AMBULATORY_CARE_PROVIDER_SITE_OTHER): Payer: Self-pay | Admitting: Vascular Surgery

## 2021-08-27 ENCOUNTER — Other Ambulatory Visit: Payer: Self-pay | Admitting: Internal Medicine

## 2021-08-27 DIAGNOSIS — I70212 Atherosclerosis of native arteries of extremities with intermittent claudication, left leg: Secondary | ICD-10-CM

## 2021-08-29 ENCOUNTER — Encounter: Payer: Medicare Other | Admitting: *Deleted

## 2021-08-29 ENCOUNTER — Encounter: Payer: Self-pay | Admitting: *Deleted

## 2021-08-29 ENCOUNTER — Other Ambulatory Visit: Payer: Self-pay

## 2021-08-29 DIAGNOSIS — I5042 Chronic combined systolic (congestive) and diastolic (congestive) heart failure: Secondary | ICD-10-CM

## 2021-08-29 NOTE — Progress Notes (Signed)
Cardiac Individual Treatment Plan  Patient Details  Name: Howard [name] MRN: [id number] Date of Birth: 05-07-1931 Referring Provider:   Flowsheet Row Cardiac Rehab from 05/24/2021 in [facility] Cardiac and Pulmonary Rehab  Referring Provider Gollan       Initial Encounter Date:  Flowsheet Row Cardiac Rehab from 05/24/2021 in [facility] Cardiac and Pulmonary Rehab  Date 05/24/21       Visit Diagnosis: Heart failure, systolic and diastolic, chronic ([place])  Patient's Home Medications on Admission:  Current Outpatient Medications:    acetaminophen (TYLENOL) 500 MG tablet, Take 1,000 mg by mouth every 6 (six) hours as needed for moderate pain., Disp: , Rfl:    albuterol (VENTOLIN HFA) 108 (90 Base) MCG/ACT inhaler, INHALE 2 PUFFS INTO LUNGS EVERY 6 HOURS AS NEEDED FOR WHEEZING OR SHORTNESS OF BREATH, Disp: 8.5 g, Rfl: 6   aspirin EC 81 MG tablet, Take 81 mg by mouth daily., Disp: , Rfl:    cetirizine (ZYRTEC) 10 MG tablet, Take 10 mg by mouth daily., Disp: , Rfl:    Cholecalciferol (VITAMIN D3) 1000 UNITS CAPS, Take 1,000 Units by mouth daily., Disp: , Rfl:    donepezil (ARICEPT) 5 MG tablet, Take 5 mg by mouth daily., Disp: , Rfl:    ELIQUIS 5 MG TABS tablet, TAKE 1 TABLET TWICE A DAY  (SWITCHED FROM PLAVIX), Disp: 60 tablet, Rfl: 11   ezetimibe (ZETIA) 10 MG tablet, TAKE 1 TABLET DAILY, Disp: 90 tablet, Rfl: 2   Fluticasone-Umeclidin-Vilant (TRELEGY ELLIPTA) 100-62.5-25 MCG/INH AEPB, Inhale 1 puff into the lungs daily., Disp: 180 each, Rfl: 3   furosemide (LASIX) 20 MG tablet, Take 1 tablet (20 mg total) by mouth daily., Disp: 30 tablet, Rfl: 5   Iron-Vitamin C (VITRON-C) 65-125 MG TABS, Take 1 tablet by mouth daily., Disp: 30 tablet, Rfl: 2   isosorbide mononitrate (IMDUR) 30 MG 24 hr tablet, TAKE 1 TABLET DAILY, Disp: 90 tablet, Rfl: 2   megestrol (MEGACE) 40 MG tablet, Take 2 tablets (80 mg total) by mouth 2 (two) times daily., Disp: 120 tablet, Rfl: 0   metFORMIN (GLUCOPHAGE) 500 MG tablet, Take  1 tablet (500 mg total) by mouth 2 (two) times daily with a meal., Disp: 180 tablet, Rfl: 1   metoprolol succinate (TOPROL-XL) 25 MG 24 hr tablet, TAKE 1 TABLET AT BEDTIME, Disp: 90 tablet, Rfl: 2   montelukast (SINGULAIR) 10 MG tablet, Take 10 mg by mouth daily as needed (sneezing/allergies.)., Disp: , Rfl:    Multiple Vitamin (MULTIVITAMIN WITH MINERALS) TABS tablet, Take 1 tablet by mouth daily. One-A-Day Multivitamin, Disp: , Rfl:    omeprazole (PRILOSEC) 40 MG capsule, TAKE 1 CAPSULE TWICE DAILY AS NEEDED, Disp: 180 capsule, Rfl: 3   promethazine-dextromethorphan (PROMETHAZINE-DM) 6.25-15 MG/5ML syrup, Take 5 mLs by mouth 3 (three) times daily as needed for cough. (Patient not taking: Reported on 08/09/2021), Disp: 118 mL, Rfl: 0   sacubitril-valsartan (ENTRESTO) 49-51 MG, Take 1 tablet by mouth 2 (two) times daily., Disp: 180 tablet, Rfl: 3   simvastatin (ZOCOR) 40 MG tablet, TAKE 1 TABLET AT BEDTIME, Disp: 90 tablet, Rfl: 3   spironolactone (ALDACTONE) 25 MG tablet, Take 1 tablet (25 mg total) by mouth daily., Disp: 90 tablet, Rfl: 3  Past Medical History: Past Medical History:  Diagnosis Date   Arthritis    Bell palsy    Bell's palsy 04/12/2015   Cancer (HCC)    prostate and skin   Chronic combined systolic and diastolic CHF, NYHA class 1 ([place])  a. 07/2014 Echo: EF 35-40%, Gr 1 DD.   Complete heart block ([place])    a. 11/2010 s/p SJM 2210 [facility] DC PPM, ser# [id number].   Depression    Diabetes mellitus without complication ([place])    Fall 11-10-14   GERD (gastroesophageal reflux disease)    History of prostate cancer    Hyperlipidemia    Hypertension    LBBB (left bundle branch block)    Left-sided Bell's palsy    Lung cancer ([place]) 2016   NICM (nonischemic cardiomyopathy) ([place])    a. 07/2014 Echo: EF 35-40%, mid-apicalanteroseptal DK, Gr 1 DD, mild-mod dil LA.   Non-obstructive CAD    a. 07/2014 Abnl MV;  b. 08/2014 Cath: LM nl, LAD 30p, RI 40p, LCX nl, OM1 40, RCA dominant 30p,  70d-->Med Rx.   Poor balance    Presence of permanent cardiac pacemaker    Sleep apnea    a. cpap   Vertigo    WPW (Wolff-Parkinson-White syndrome)    a. S/P RFCA 1991.    Tobacco Use: Social History   Tobacco Use  Smoking Status Former   Packs/day: 1.00   Years: 56.00   Pack years: 56.00   Types: Cigarettes   Quit date: 2011   Years since quitting: 11.6  Smokeless Tobacco Never    Labs: Recent Review Scientist, physiological     Labs for ITP Cardiac and Pulmonary Rehab Latest Ref Rng & Units 09/07/2019 12/10/2019 05/08/2020 11/09/2020 11/13/2020   Cholestrol 100 - 199 mg/dL - 136 134 - 130   LDLCALC 0 - 99 mg/dL - 75 66 - 63   HDL >39 mg/dL - 42 52 - 43   Trlycerides 0 - 149 mg/dL - 93 83 - 137   Hemoglobin A1c 4.0 - 5.6 % 7.2(H) 7.5(H) 7.4(H) 7.7(A) -   HCO3 20.0 - 24.0 mEq/L - - - - -   TCO2 0 - 100 mmol/L - - - - -   O2SAT % - - - - -        Exercise Target Goals: Exercise Program Goal: Individual exercise prescription set using results from initial 6 min walk test and THRR while considering  patient's activity barriers and safety.   Exercise Prescription Goal: Initial exercise prescription builds to 30-45 minutes a day of aerobic activity, 2-3 days per week.  Home exercise guidelines will be given to patient during program as part of exercise prescription that the participant will acknowledge.   Education: Aerobic Exercise: - Group verbal and visual presentation on the components of exercise prescription. Introduces F.I.T.T principle from ACSM for exercise prescriptions.  Reviews F.I.T.T. principles of aerobic exercise including progression. Written material given at graduation.   Education: Resistance Exercise: - Group verbal and visual presentation on the components of exercise prescription. Introduces F.I.T.T principle from ACSM for exercise prescriptions  Reviews F.I.T.T. principles of resistance exercise including progression. Written material given at  graduation. Flowsheet Row Cardiac Rehab from 07/11/2021 in [facility] Cardiac and Pulmonary Rehab  Date 06/20/21  Educator [facility]  Instruction Review Code 1- Verbalizes Understanding        Education: Exercise & Equipment Safety: - Individual verbal instruction and demonstration of equipment use and safety with use of the equipment. Flowsheet Row Cardiac Rehab from 07/11/2021 in [facility] Cardiac and Pulmonary Rehab  Date 05/24/21  Educator AS  Instruction Review Code 1- Verbalizes Understanding       Education: Exercise Physiology & General Exercise Guidelines: - Group verbal and written instruction with models to  review the exercise physiology of the cardiovascular system and associated critical values. Provides general exercise guidelines with specific guidelines to those with heart or lung disease.    Education: Flexibility, Balance, Mind/Body Relaxation: - Group verbal and visual presentation with interactive activity on the components of exercise prescription. Introduces F.I.T.T principle from ACSM for exercise prescriptions. Reviews F.I.T.T. principles of flexibility and balance exercise training including progression. Also discusses the mind body connection.  Reviews various relaxation techniques to help reduce and manage stress (i.e. Deep breathing, progressive muscle relaxation, and visualization). Balance handout provided to take home. Written material given at graduation. Flowsheet Row Cardiac Rehab from 07/11/2021 in [facility] Cardiac and Pulmonary Rehab  Date 06/27/21  Educator [facility]  Instruction Review Code 1- Verbalizes Understanding       Activity Barriers & Risk Stratification:  Activity Barriers & Cardiac Risk Stratification - 05/14/21 1406       Activity Barriers & Cardiac Risk Stratification   Activity Barriers Back Problems;Joint Problems;Decreased Ventricular Function;Muscular Weakness    Cardiac Risk Stratification High             6 Minute Walk:  6 Minute Walk     Row  Name 05/24/21 1531         6 Minute Walk   Distance 590 feet     Walk Time 5 minutes     # of Rest Breaks 2     MPH 1.34     METS 1.1     RPE 17     Perceived Dyspnea  3     VO2 Peak 3.86     Symptoms Yes (comment)     Comments leg pain/cramping due to vascular issue 5/10     Resting HR 90 bpm     Resting BP 126/62     Resting Oxygen Saturation  98 %     Exercise Oxygen Saturation  during 6 min walk 96 %     Max Ex. HR 118 bpm     Max Ex. BP 156/72     2 Minute Post BP 140/70              Oxygen Initial Assessment:   Oxygen Re-Evaluation:   Oxygen Discharge (Final Oxygen Re-Evaluation):   Initial Exercise Prescription:  Initial Exercise Prescription - 05/24/21 1500       Date of Initial Exercise RX and Referring Provider   Date 05/24/21    Referring Provider Gollan      NuStep   Level 1    SPM 80    Minutes 15    METs 1.1      Recumbant Elliptical   Level 1    RPM 50    Minutes 15    METs 1.1      REL-XR   Level 1    Speed 50    Minutes 15    METs 1.1      Track   Laps 10    Minutes 15      Prescription Details   Frequency (times per week) 3    Duration Progress to 30 minutes of continuous aerobic without signs/symptoms of physical distress      Intensity   THRR 40-80% of Max Heartrate 107-124    Ratings of Perceived Exertion 11-13    Perceived Dyspnea 0-4      Progression   Progression Continue to progress workloads to maintain intensity without signs/symptoms of physical distress.      Horticulturist, commercial  Prescription Yes    Weight 3 lb    Reps 10-15             Perform Capillary Blood Glucose checks as needed.  Exercise Prescription Changes:   Exercise Prescription Changes     Row Name 05/24/21 1600 06/26/21 1500 07/12/21 0800 07/23/21 1000 07/24/21 0800     Response to Exercise   Blood Pressure (Admit) 126/62 134/74 122/62 -- 126/58   Blood Pressure (Exercise) 156/72 126/68 114/58 -- 122/60   Blood  Pressure (Exit) 142/70 130/68 104/56 -- 112/54   Heart Rate (Admit) 90 bpm 89 bpm 78 bpm -- 94 bpm   Heart Rate (Exercise) 118 bpm 111 bpm 101 bpm -- 100 bpm   Heart Rate (Exit) 93 bpm 87 bpm 89 bpm -- 90 bpm   Oxygen Saturation (Admit) 98 % -- -- -- --   Oxygen Saturation (Exercise) 96 % -- -- -- --   Rating of Perceived Exertion (Exercise) 17 12 13  -- 13   Perceived Dyspnea (Exercise) 3 -- -- -- --   Symptoms 5/10 leg cramp/pain fatigue none -- fatigue in legs   Comments -- second full day of exercise -- -- --   Duration -- Progress to 30 minutes of  aerobic without signs/symptoms of physical distress Progress to 30 minutes of  aerobic without signs/symptoms of physical distress -- Progress to 30 minutes of  aerobic without signs/symptoms of physical distress   Intensity -- THRR unchanged THRR unchanged -- THRR unchanged     Progression   Progression -- Continue to progress workloads to maintain intensity without signs/symptoms of physical distress. Continue to progress workloads to maintain intensity without signs/symptoms of physical distress. -- Continue to progress workloads to maintain intensity without signs/symptoms of physical distress.   Average METs -- 1.97 1.96 -- 2.5     Resistance Training   Training Prescription -- Yes Yes -- Yes   Weight -- 3 lb 3 lb -- 3 lb   Reps -- 10-15 10-15 -- 10-15     Interval Training   Interval Training -- No No -- No     NuStep   Level -- 1 3 -- 4   Minutes -- 15 30 -- 15   METs -- 2.5 2 -- 2.5     T5 Nustep   Level -- -- 3 -- --   Minutes -- -- 15 -- --   METs -- -- 1.8 -- --     Track   Laps -- 8 10 -- 2   Minutes -- 15 15 -- 15   METs -- 1.44 1.54 -- --     Home Exercise Plan   Plans to continue exercise at -- -- -- Home (comment)  walking, Youtube staff videos Home (comment)  walking, Youtube staff videos   Frequency -- -- -- Add 2 additional days to program exercise sessions.  Start with 1 Add 2 additional days to program  exercise sessions.  Start with 1   Initial Home Exercises Provided -- -- -- 07/23/21 07/23/21    Row Name 08/08/21 0700 08/20/21 0900           Response to Exercise   Blood Pressure (Admit) 122/58 118/60      Blood Pressure (Exercise) 132/82 --      Blood Pressure (Exit) 142/70 122/60      Heart Rate (Admit) 93 bpm 59 bpm      Heart Rate (Exercise) 119 bpm 120 bpm  Heart Rate (Exit) 112 bpm 112 bpm      Rating of Perceived Exertion (Exercise) 11 12      Duration Progress to 30 minutes of  aerobic without signs/symptoms of physical distress Progress to 30 minutes of  aerobic without signs/symptoms of physical distress      Intensity THRR unchanged THRR unchanged             Progression   Progression Continue to progress workloads to maintain intensity without signs/symptoms of physical distress. Continue to progress workloads to maintain intensity without signs/symptoms of physical distress.      Average METs 2.3 2.01             Resistance Training   Training Prescription Yes Yes      Weight 3 lb 3 lb      Reps 10-15 10-15             Interval Training   Interval Training No No             NuStep   Level 4 4      Minutes 15 30      METs 2.3 1.9             Biostep-RELP   Level -- 4      Minutes -- 15      METs -- 2             Home Exercise Plan   Plans to continue exercise at Home (comment)  walking, Youtube staff videos Home (comment)  walking, Youtube staff videos      Frequency Add 2 additional days to program exercise sessions.  Start with 1 Add 2 additional days to program exercise sessions.  Start with 1      Initial Home Exercises Provided 07/23/21 07/23/21               Exercise Comments:   Exercise Comments     Row Name 06/20/21 1018           Exercise Comments First full day of exercise!  Patient was oriented to gym and equipment including functions, settings, policies, and procedures.  Patient's individual exercise prescription and  treatment plan were reviewed.  All starting workloads were established based on the results of the 6 minute walk test done at initial orientation visit.  The plan for exercise progression was also introduced and progression will be customized based on patient's performance and goals.                Exercise Goals and Review:   Exercise Goals     Row Name 05/24/21 1602             Exercise Goals   Increase Physical Activity Yes       Intervention Provide advice, education, support and counseling about physical activity/exercise needs.;Develop an individualized exercise prescription for aerobic and resistive training based on initial evaluation findings, risk stratification, comorbidities and participant's personal goals.       Expected Outcomes Short Term: Attend rehab on a regular basis to increase amount of physical activity.;Long Term: Add in home exercise to make exercise part of routine and to increase amount of physical activity.;Long Term: Exercising regularly at least 3-5 days a week.       Increase Strength and Stamina Yes       Intervention Provide advice, education, support and counseling about physical activity/exercise needs.;Develop an individualized exercise prescription for aerobic and resistive training based on initial  evaluation findings, risk stratification, comorbidities and participant's personal goals.       Expected Outcomes Short Term: Increase workloads from initial exercise prescription for resistance, speed, and METs.;Long Term: Improve cardiorespiratory fitness, muscular endurance and strength as measured by increased METs and functional capacity (6MWT)       Able to understand and use rate of perceived exertion (RPE) scale Yes       Intervention Provide education and explanation on how to use RPE scale       Expected Outcomes Short Term: Able to use RPE daily in rehab to express subjective intensity level;Long Term:  Able to use RPE to guide intensity level  when exercising independently       Able to understand and use Dyspnea scale Yes       Intervention Provide education and explanation on how to use Dyspnea scale       Expected Outcomes Short Term: Able to use Dyspnea scale daily in rehab to express subjective sense of shortness of breath during exertion;Long Term: Able to use Dyspnea scale to guide intensity level when exercising independently       Knowledge and understanding of Target Heart Rate Range (THRR) Yes       Intervention Provide education and explanation of THRR including how the numbers were predicted and where they are located for reference       Expected Outcomes Short Term: Able to state/look up THRR;Short Term: Able to use daily as guideline for intensity in rehab;Long Term: Able to use THRR to govern intensity when exercising independently       Able to check pulse independently Yes       Intervention Provide education and demonstration on how to check pulse in carotid and radial arteries.;Review the importance of being able to check your own pulse for safety during independent exercise       Expected Outcomes Short Term: Able to explain why pulse checking is important during independent exercise;Long Term: Able to check pulse independently and accurately       Understanding of Exercise Prescription Yes       Intervention Provide education, explanation, and written materials on patient's individual exercise prescription       Expected Outcomes Short Term: Able to explain program exercise prescription;Long Term: Able to explain home exercise prescription to exercise independently                Exercise Goals Re-Evaluation :  Exercise Goals Re-Evaluation     Row Name 06/20/21 1018 06/26/21 1529 07/12/21 0853 07/23/21 1030 07/24/21 0835     Exercise Goal Re-Evaluation   Exercise Goals Review Increase Physical Activity;Increase Strength and Stamina;Able to understand and use rate of perceived exertion (RPE) scale;Knowledge  and understanding of Target Heart Rate Range (THRR);Able to understand and use Dyspnea scale;Able to check pulse independently;Understanding of Exercise Prescription Increase Physical Activity;Increase Strength and Stamina;Understanding of Exercise Prescription Increase Physical Activity;Increase Strength and Stamina;Understanding of Exercise Prescription Increase Physical Activity;Increase Strength and Stamina;Understanding of Exercise Prescription Increase Physical Activity;Increase Strength and Stamina;Understanding of Exercise Prescription   Comments Reviewed RPE and dyspnea scales, THR and program prescription with pt today.  Pt voiced understanding and was given a copy of goals to take home. Howard [name] has completed his first two full days of exercise.  We will continue to encourage good attendance and monitor his progress. Howard [name] is doing well in rehab. He has already increased his level to 3 on the T4 Nustep and tolerated 10 laps on  the track. Will continue to monitor. Reviewed home exercise with pt today.  Pt plans to walk or use the Youtube videos for exercise. Start with slow progression and build up tolerance. Reviewed THR, pulse, RPE, sign and symptoms, pulse oximetery and when to call 911 or MD.  Also discussed weather considerations and indoor options.  Pt voiced understanding. Howard [name] is doing well in rehab.  He is is up to 2.5 METs on the NuStep.  He does try to walk each day and usually averages about two laps each day.  We will continue to montior his progress.   Expected Outcomes Short: Use RPE daily to regulate intensity. Long: Follow program prescription in THR. Short: Continue to attend rehab regularly Long: Continue to follow prescription Short: Continue to increase number of laps as tolerated Long: Continue to build up overall strength and stamina Short: Start with 1 day of exercise at home Long: Continue to exercise independently at home at appropriate prescription Short: Continue to improve on  walking LOng: Continue to improve stamina    Row Name 08/08/21 0755 08/20/21 0908           Exercise Goal Re-Evaluation   Exercise Goals Review Increase Physical Activity;Increase Strength and Stamina Increase Physical Activity;Increase Strength and Stamina      Comments Howard [name] is up to level 4 on NS.  Staff will encourage him to continue adding laps walking. Howard [name] has not walked here at rehab as OT wants him to use his walker due to a fall risk. Patient has not brought his walker with him and refuses to walk. Patient has been continuing to do level 4 on BS and T4 Nustep and has not shown much improvement compared to when he first started. Staff will talk to patient to evaluate if patient is more appropriate for PT.      Expected Outcomes Short:  work up to 5 laps walking Long:  improve overall stamina Short: Discuss rehab vs. PT Long: Continue to increase overall strength and stamina               Discharge Exercise Prescription (Final Exercise Prescription Changes):  Exercise Prescription Changes - 08/20/21 0900       Response to Exercise   Blood Pressure (Admit) 118/60    Blood Pressure (Exit) 122/60    Heart Rate (Admit) 59 bpm    Heart Rate (Exercise) 120 bpm    Heart Rate (Exit) 112 bpm    Rating of Perceived Exertion (Exercise) 12    Duration Progress to 30 minutes of  aerobic without signs/symptoms of physical distress    Intensity THRR unchanged      Progression   Progression Continue to progress workloads to maintain intensity without signs/symptoms of physical distress.    Average METs 2.01      Resistance Training   Training Prescription Yes    Weight 3 lb    Reps 10-15      Interval Training   Interval Training No      NuStep   Level 4    Minutes 30    METs 1.9      Biostep-RELP   Level 4    Minutes 15    METs 2      Home Exercise Plan   Plans to continue exercise at Home (comment)   walking, Youtube staff videos   Frequency Add 2 additional days to  program exercise sessions.   Start with 1   Initial Home Exercises Provided 07/23/21  Nutrition:  Target Goals: Understanding of nutrition guidelines, daily intake of sodium 1500mg , cholesterol 200mg , calories 30% from fat and 7% or less from saturated fats, daily to have 5 or more servings of fruits and vegetables.  Education: All About Nutrition: -Group instruction provided by verbal, written material, interactive activities, discussions, models, and posters to present general guidelines for heart healthy nutrition including fat, fiber, MyPlate, the role of sodium in heart healthy nutrition, utilization of the nutrition label, and utilization of this knowledge for meal planning. Follow up email sent as well. Written material given at graduation. Flowsheet Row Cardiac Rehab from 07/11/2021 in [facility] Cardiac and Pulmonary Rehab  Date 07/04/21  Educator [facility]  Instruction Review Code 1- Verbalizes Understanding       Biometrics:  Pre Biometrics - 05/24/21 1603       Pre Biometrics   Height 5' 9.5" (1.765 m)    Weight 190 lb (86.2 kg)    BMI (Calculated) 27.67              Nutrition Therapy Plan and Nutrition Goals:  Nutrition Therapy & Goals - 08/15/21 1535       Nutrition Therapy   RD appointment deferred Yes   Howard [name] is still working towards weight gain with outpatient RD, his weakness is worsening at this time. Will continue to follow up.            Nutrition Assessments:  MEDIFICTS Score Key: ?70 Need to make dietary changes  40-70 Heart Healthy Diet ? 40 Therapeutic Level Cholesterol Diet  Flowsheet Row Cardiac Rehab from 05/24/2021 in [facility] Cardiac and Pulmonary Rehab  Picture Your Plate Total Score on Admission 56      Picture Your Plate Scores: <53 Unhealthy dietary pattern with much room for improvement. 41-50 Dietary pattern unlikely to meet recommendations for good health and room for improvement. 51-60 More healthful dietary pattern, with  some room for improvement.  >60 Healthy dietary pattern, although there may be some specific behaviors that could be improved.    Nutrition Goals Re-Evaluation:  Nutrition Goals Re-Evaluation     [place] Name 06/27/21 1007 07/23/21 1010 08/20/21 1006         Goals   Current Weight 184 lb (83.5 kg) -- --     Nutrition Goal Meet with the Dietician. Meet with the [place]. ST/LT: continue to see outpatient RD     Comment Howard [name] has been going out a lot to eat since his wifes back hurts when she stands. He has set an appointment with the dietician. Howard [name] is currently seeing a dietican outside of rehab. Since he had radiation, he hasn't had an appetite.He still needs to meet with the RD here to establish goals. [place] is continues to see a dietitian outside of rehab. Since he had radiation, he hasn't had an appetite, she provided him with medication to help with hunger. SHe has told him to increase fat to improve calorie intake. Will continue to follow up.     Expected Outcome Short: meet with the dietician. Long: maintain a diet that pertains to him. Short: Meet with RD Long: Continue to eat heart healthy diet ST/LT: continue to see outpatient RD              Nutrition Goals Discharge (Final Nutrition Goals Re-Evaluation):  Nutrition Goals Re-Evaluation - 08/20/21 1006       Goals   Nutrition Goal ST/LT: continue to see outpatient RD    Comment Howard [name] is continues to see a  dietitian outside of rehab. Since he had radiation, he hasn't had an appetite, she provided him with medication to help with hunger. SHe has told him to increase fat to improve calorie intake. Will continue to follow up.    Expected Outcome ST/LT: continue to see outpatient RD             Psychosocial: Target Goals: Acknowledge presence or absence of significant depression and/or stress, maximize coping skills, provide positive support system. Participant is able to verbalize types and ability to use techniques and skills  needed for reducing stress and depression.   Education: Stress, Anxiety, and Depression - Group verbal and visual presentation to define topics covered.  Reviews how body is impacted by stress, anxiety, and depression.  Also discusses healthy ways to reduce stress and to treat/manage anxiety and depression.  Written material given at graduation.   Education: Sleep Hygiene -Provides group verbal and written instruction about how sleep can affect your health.  Define sleep hygiene, discuss sleep cycles and impact of sleep habits. Review good sleep hygiene tips.    Initial Review & Psychosocial Screening:  Initial Psych Review & Screening - 05/14/21 1411       Initial Review   Current issues with Current Stress Concerns    Source of Stress Concerns Unable to participate in former interests or hobbies;Unable to perform yard/household activities;Chronic Illness      Family Dynamics   Good Support System? Yes   wife     Barriers   Psychosocial barriers to participate in program There are no identifiable barriers or psychosocial needs.;The patient should benefit from training in stress management and relaxation.      Screening Interventions   Interventions Encouraged to exercise;Provide feedback about the scores to participant;To provide support and resources with identified psychosocial needs;Program counselor consult    Expected Outcomes Short Term goal: Utilizing psychosocial counselor, staff and physician to assist with identification of specific Stressors or current issues interfering with healing process. Setting desired goal for each stressor or current issue identified.;Long Term Goal: Stressors or current issues are controlled or eliminated.;Short Term goal: Identification and review with participant of any Quality of Life or Depression concerns found by scoring the questionnaire.;Long Term goal: The participant improves quality of Life and PHQ9 Scores as seen by post scores and/or  verbalization of changes             Quality of Life Scores:   Quality of Life - 05/24/21 1611       Quality of Life   Select Quality of Life      Quality of Life Scores   Health/Function Pre 23.9 %    Socioeconomic Pre 30 %    Psych/Spiritual Pre 28.29 %    Family Pre 29.5 %    GLOBAL Pre 26.79 %            Scores of 19 and below usually indicate a poorer quality of life in these areas.  A difference of  2-3 points is a clinically meaningful difference.  A difference of 2-3 points in the total score of the Quality of Life Index has been associated with significant improvement in overall quality of life, self-image, physical symptoms, and general health in studies assessing change in quality of life.  PHQ-9: Recent Review Flowsheet Data     Depression screen [facility] 2/9 05/24/2021 11/08/2020   Decreased Interest 0 0   Down, Depressed, Hopeless 0 0   PHQ - 2 Score 0 0  Altered sleeping 0 -   Tired, decreased energy 0 -   Change in appetite 0 -   Feeling bad or failure about yourself  0 -   Trouble concentrating 0 -   Moving slowly or fidgety/restless 0 -   Suicidal thoughts 0 -   PHQ-9 Score 0 -      Interpretation of Total Score  Total Score Depression Severity:  1-4 = Minimal depression, 5-9 = Mild depression, 10-14 = Moderate depression, 15-19 = Moderately severe depression, 20-27 = Severe depression   Psychosocial Evaluation and Intervention:  Psychosocial Evaluation - 05/14/21 1416       Psychosocial Evaluation & Interventions   Interventions Encouraged to exercise with the program and follow exercise prescription;Stress management education    Comments Howard [name] reports doing well. His hearing is a big stressor for him, so he relies on his wife's help in this area as well as managing his health care. He states he is sleeping well now that they have gotten his medication adjusted. He did have some back issues that have caused walking difficulties at times and  increased his weakness which has led him to not doing whatever he wants, so he is looking forward to working on his balance and his stamina. He is also starting radiation medication so he is a little hesitant as to how he will feel during treatment.    Expected Outcomes Short; attend Cardiac Rehab for education and Exercise. Long: develop and maintain positive self care habits.    Continue Psychosocial Services  Follow up required by staff             Psychosocial Re-Evaluation:  Psychosocial Re-Evaluation     Howard [name] Name 06/27/21 1006 07/23/21 1009 08/20/21 1007         Psychosocial Re-Evaluation   Current issues with None Identified Current Stress Concerns None Identified     Comments Patient reports no issues with their current mental states, sleep, stress, depression or anxiety. Will follow up with patient in a few weeks for any changes. Howard [name] has really good support from his wife of 24 years! He just finished radiation about a month ago and is experiencing some side effects from that, such as fatigue and loss of appetite. Other than that, he denies any other concerns or stresses. He is not on any medication for depression or anxiety. He is seeing his cardiologist this week to get results from his testing and is anxious to hear back. Howard [name] has really good support from his wife of 81 years and reports no stressors at this time, but has reported some regarding not being able to do what he used to physically in the past. He just finished radiation about a month ago and is experiencing some side effects from that, such as fatigue and loss of appetite.  He is not on any medication for depression or anxiety. He likes to watch the history channel.     Expected Outcomes Short: Continue to exercise regularly to support mental health and notify staff of any changes. Long: maintain mental health and well being through teaching of rehab or prescribed medications independently. Short: Continue attendance  with rehab to maintain energy levels Long: Continue to utilize exercise for stress management and maintain positive attitude Short: Continue attendance with rehab to maintain energy levels Long: Continue to utilize exercise for stress management and maintain positive attitude     Interventions Encouraged to attend Cardiac Rehabilitation for the exercise Encouraged to attend Cardiac Rehabilitation for the  exercise Encouraged to attend Cardiac Rehabilitation for the exercise     Continue Psychosocial Services  Follow up required by staff Follow up required by staff Follow up required by staff           Initial Review   Source of Stress Concerns -- -- Unable to participate in former interests or hobbies;Unable to perform yard/household activities;Chronic Illness              Psychosocial Discharge (Final Psychosocial Re-Evaluation):  Psychosocial Re-Evaluation - 08/20/21 1007       Psychosocial Re-Evaluation   Current issues with None Identified    Comments Howard [name] has really good support from his wife of 30 years and reports no stressors at this time, but has reported some regarding not being able to do what he used to physically in the past. He just finished radiation about a month ago and is experiencing some side effects from that, such as fatigue and loss of appetite.  He is not on any medication for depression or anxiety. He likes to watch the history channel.    Expected Outcomes Short: Continue attendance with rehab to maintain energy levels Long: Continue to utilize exercise for stress management and maintain positive attitude    Interventions Encouraged to attend Cardiac Rehabilitation for the exercise    Continue Psychosocial Services  Follow up required by staff      Initial Review   Source of Stress Concerns Unable to participate in former interests or hobbies;Unable to perform yard/household activities;Chronic Illness             Vocational Rehabilitation: Provide  vocational rehab assistance to qualifying candidates.   Vocational Rehab Evaluation & Intervention:  Vocational Rehab - 05/14/21 1411       Initial Vocational Rehab Evaluation & Intervention   Assessment shows need for Vocational Rehabilitation No             Education: Education Goals: Education classes will be provided on a variety of topics geared toward better understanding of heart health and risk factor modification. Participant will state understanding/return demonstration of topics presented as noted by education test scores.  Learning Barriers/Preferences:  Learning Barriers/Preferences - 05/14/21 1406       Learning Barriers/Preferences   Learning Barriers Hearing    Learning Preferences Individual Instruction             General Cardiac Education Topics:  AED/CPR: - Group verbal and written instruction with the use of models to demonstrate the basic use of the AED with the basic ABC's of resuscitation.   Anatomy and Cardiac Procedures: - Group verbal and visual presentation and models provide information about basic cardiac anatomy and function. Reviews the testing methods done to diagnose heart disease and the outcomes of the test results. Describes the treatment choices: Medical Management, Angioplasty, or Coronary Bypass Surgery for treating various heart conditions including Myocardial Infarction, Angina, Valve Disease, and Cardiac Arrhythmias.  Written material given at graduation. Flowsheet Row Cardiac Rehab from 07/11/2021 in [facility] Cardiac and Pulmonary Rehab  Date 06/20/21  Educator SB  Instruction Review Code 1- Verbalizes Understanding       Medication Safety: - Group verbal and visual instruction to review commonly prescribed medications for heart and lung disease. Reviews the medication, class of the drug, and side effects. Includes the steps to properly store meds and maintain the prescription regimen.  Written material given at  graduation. Flowsheet Row Cardiac Rehab from 07/11/2021 in [facility] Cardiac and Pulmonary Rehab  Date  07/11/21  Educator SB  Instruction Review Code 1- Verbalizes Understanding       Intimacy: - Group verbal instruction through game format to discuss how heart and lung disease can affect sexual intimacy. Written material given at graduation..   Know Your Numbers and Heart Failure: - Group verbal and visual instruction to discuss disease risk factors for cardiac and pulmonary disease and treatment options.  Reviews associated critical values for Overweight/Obesity, Hypertension, Cholesterol, and Diabetes.  Discusses basics of heart failure: signs/symptoms and treatments.  Introduces Heart Failure Zone chart for action plan for heart failure.  Written material given at graduation.   Infection Prevention: - Provides verbal and written material to individual with discussion of infection control including proper hand washing and proper equipment cleaning during exercise session. Flowsheet Row Cardiac Rehab from 07/11/2021 in [facility] Cardiac and Pulmonary Rehab  Date 05/24/21  Educator AS  Instruction Review Code 1- Verbalizes Understanding       Falls Prevention: - Provides verbal and written material to individual with discussion of falls prevention and safety. Flowsheet Row Cardiac Rehab from 07/11/2021 in [facility] Cardiac and Pulmonary Rehab  Date 05/24/21  Educator AS  Instruction Review Code 1- Verbalizes Understanding       Other: -Provides group and verbal instruction on various topics (see comments)   Knowledge Questionnaire Score:   Core Components/Risk Factors/Patient Goals at Admission:  Personal Goals and Risk Factors at Admission - 05/24/21 1604       Core Components/Risk Factors/Patient Goals on Admission   Diabetes Yes    Intervention Provide education about signs/symptoms and action to take for hypo/hyperglycemia.;Provide education about proper nutrition, including  hydration, and aerobic/resistive exercise prescription along with prescribed medications to achieve blood glucose in normal ranges: Fasting glucose 65-99 mg/dL    Expected Outcomes Short Term: Participant verbalizes understanding of the signs/symptoms and immediate care of hyper/hypoglycemia, proper foot care and importance of medication, aerobic/resistive exercise and nutrition plan for blood glucose control.;Long Term: Attainment of HbA1C < 7%.    Heart Failure Yes    Intervention Provide a combined exercise and nutrition program that is supplemented with education, support and counseling about heart failure. Directed toward relieving symptoms such as shortness of breath, decreased exercise tolerance, and extremity edema.    Expected Outcomes Improve functional capacity of life;Short term: Attendance in program 2-3 days a week with increased exercise capacity. Reported lower sodium intake. Reported increased fruit and vegetable intake. Reports medication compliance.;Short term: Daily weights obtained and reported for increase. Utilizing diuretic protocols set by physician.;Long term: Adoption of self-care skills and reduction of barriers for Howard [name] signs and symptoms recognition and intervention leading to self-care maintenance.    Hypertension Yes    Intervention Provide education on lifestyle modifcations including regular physical activity/exercise, weight management, moderate sodium restriction and increased consumption of fresh fruit, vegetables, and low fat dairy, alcohol moderation, and smoking cessation.;Monitor prescription use compliance.    Expected Outcomes Short Term: Continued assessment and intervention until BP is < 140/62mm HG in hypertensive participants. < 130/65mm HG in hypertensive participants with diabetes, heart failure or chronic kidney disease.;Long Term: Maintenance of blood pressure at goal levels.             Education:Diabetes - Individual verbal and written instruction  to review signs/symptoms of diabetes, desired ranges of glucose level fasting, after meals and with exercise. Acknowledge that pre and post exercise glucose checks will be done for 3 sessions at entry of program. Flowsheet Row Cardiac Rehab from  05/14/2021 in [facility] Cardiac and Pulmonary Rehab  Date 05/14/21  Educator [facility]  Instruction Review Code 1- Verbalizes Understanding       Core Components/Risk Factors/Patient Goals Review:   Goals and Risk Factor Review     Row Name 06/27/21 1003 07/23/21 1004 08/20/21 1002         Core Components/Risk Factors/Patient Goals Review   Personal Goals Review Diabetes;Weight Management/Obesity Diabetes;Weight Management/Obesity;Heart Failure Diabetes;Heart Failure     Review [place] has been checking his sugar at home and has been in the 130s. He has had most of his blood sugar checks at [facility] and they have been within normal ranges. He would like to lose a little bit of weight in the program. He would like to reach a weight goal of 170 pounds. Howard [name] has not been checking his sugars at home and says he will start getting in the habit again. He discussed the importance of checking them and I encouraged him to ask his doctor to clarify how often he should be checking them. He has been losing weight and seeing a dietician to help him gain weight. He has a scale at home and weighs himself. He knows to look out for sudden weight gain in a short period of time with heart failure. Wife is good support with maintaining his medications.  He has been feeling a little winded, but has an echo tomorrow to assess, and talking to his doctor this week for results. Howard [name] has a walker at home, but has been getting weak. He finished chemo last month. He continues to see outpatient RD regarding his weight - gave him pills to encourage hunger. His weight has been stable for the time being. He will sometimes check his BG at home - not consistent (BG: he does not recall) - he knows he  should check it more often. He continues to weigh himself at home. He is taking all of his medications as directed with the help of his wife. He reports no issues with medications. His doctor has talked to him about potentially needing supplemental O2.     Expected Outcomes Short: lose some weight. Long: reach a weight goal of 170 pounds. Short: Talk to doctor Long: Continue to manage lifestyle risk factors Short: check BG regularly Long: Continue to manage lifestyle risk factors              Core Components/Risk Factors/Patient Goals at Discharge (Final Review):   Goals and Risk Factor Review - 08/20/21 1002       Core Components/Risk Factors/Patient Goals Review   Personal Goals Review Diabetes;Heart Failure    Review Howard [name] has a walker at home, but has been getting weak. He finished chemo last month. He continues to see outpatient RD regarding his weight - gave him pills to encourage hunger. His weight has been stable for the time being. He will sometimes check his BG at home - not consistent (BG: he does not recall) - he knows he should check it more often. He continues to weigh himself at home. He is taking all of his medications as directed with the help of his wife. He reports no issues with medications. His doctor has talked to him about potentially needing supplemental O2.    Expected Outcomes Short: check BG regularly Long: Continue to manage lifestyle risk factors             ITP Comments:  ITP Comments     Row Name 05/14/21 1404 05/24/21 1617 06/06/21  [id number] 06/20/21 1017 07/04/21 1200   ITP Comments Initial telephone orientation completed. Diagnosis can be found in [facility] 5/2. EP orientation scheduled for Thursday 6/2 at 2pm. Completed 6MWT and gym orientation. Initial ITP created and sent for review to Dr. Emily Filbert, Medical Director. 30 Day review completed. Medical Director ITP review done, changes made as directed, and signed approval by Medical Director.  new to program  First full day of exercise!  Patient was oriented to gym and equipment including functions, settings, policies, and procedures.  Patient's individual exercise prescription and treatment plan were reviewed.  All starting workloads were established based on the results of the 6 minute walk test done at initial orientation visit.  The plan for exercise progression was also introduced and progression will be customized based on patient's performance and goals. 30 Day review completed. Medical Director ITP review done, changes made as directed, and signed approval by Medical Director.    [place] Name 08/01/21 0825 08/02/21 0851 08/29/21 0758       ITP Comments 30 Day review completed. Medical Director ITP review done, changes made as directed, and signed approval by Medical Director. Per Gwenette Greet, OT, patient was evaluated by OT who stated medium fall risk. Patient should start using walker when at rehab per OT. Getting evaluated for possible referral for PT. Need to discuss with patient on his decision. 30 Day review completed. Medical Director ITP review done, changes made as directed, and signed approval by Medical Director.              Comments:

## 2021-08-29 NOTE — Progress Notes (Signed)
Daily Session Note  Patient Details  Name: Howard [name] MRN: [id number] Date of Birth: 1931-02-28 Referring Provider:   Flowsheet Row Cardiac Rehab from 05/24/2021 in [facility] Cardiac and Pulmonary Rehab  Referring Provider Gollan       Encounter Date: 08/29/2021  Check In:  Session Check In - 08/29/21 1111       Check-In   Supervising physician immediately available to respond to emergencies See telemetry face sheet for immediately available ER MD    Location [facility]-Cardiac & Pulmonary Rehab    Staff Present Renita Papa, RN BSN;Joseph Adams, RCP,RRT,BSRT;Jessica [place], [place], RCEP, CCRP, CCET    Virtual Visit No    Medication changes reported     No    Fall or balance concerns reported    No    Warm-up and Cool-down Performed on first and last piece of equipment    Resistance Training Performed Yes    VAD Patient? No    PAD/SET Patient? No      Pain Assessment   Currently in Pain? No/denies                Social History   Tobacco Use  Smoking Status Former   Packs/day: 1.00   Years: 56.00   Pack years: 56.00   Types: Cigarettes   Quit date: 2011   Years since quitting: 11.6  Smokeless Tobacco Never    Goals Met:  Independence with exercise equipment Exercise tolerated well No report of concerns or symptoms today Strength training completed today  Goals Unmet:  Not Applicable  Comments: Pt able to follow exercise prescription today without complaint.  Will continue to monitor for progression.    Dr. Emily Filbert is Medical Director for [place].  Dr. Ottie Glazier is Medical Director for [facility] Pulmonary Rehabilitation.

## 2021-08-31 ENCOUNTER — Other Ambulatory Visit: Payer: Self-pay

## 2021-08-31 ENCOUNTER — Encounter: Payer: Medicare Other | Admitting: *Deleted

## 2021-08-31 DIAGNOSIS — I5042 Chronic combined systolic (congestive) and diastolic (congestive) heart failure: Secondary | ICD-10-CM

## 2021-08-31 NOTE — Progress Notes (Signed)
Daily Session Note  Patient Details  Name: Howard [name] MRN: [id number] Date of Birth: October 25, 1931 Referring Provider:   Flowsheet Row Cardiac Rehab from 05/24/2021 in [facility] Cardiac and Pulmonary Rehab  Referring Provider Gollan       Encounter Date: 08/31/2021  Check In:  Session Check In - 08/31/21 3154       Check-In   Supervising physician immediately available to respond to emergencies See telemetry face sheet for immediately available ER MD    Location [facility]-Cardiac & Pulmonary Rehab    Staff Present Renita Papa, RN BSN;Joseph [place], RCP,RRT,BSRT;Jessica [place], [place], RCEP, CCRP, CCET    Virtual Visit No    Medication changes reported     No    Fall or balance concerns reported    No    Warm-up and Cool-down Performed on first and last piece of equipment    Resistance Training Performed Yes    VAD Patient? No    PAD/SET Patient? No      Pain Assessment   Currently in Pain? No/denies                Social History   Tobacco Use  Smoking Status Former   Packs/day: 1.00   Years: 56.00   Pack years: 56.00   Types: Cigarettes   Quit date: 2011   Years since quitting: 11.6  Smokeless Tobacco Never    Goals Met:  Independence with exercise equipment Exercise tolerated well No report of concerns or symptoms today Strength training completed today  Goals Unmet:  Not Applicable  Comments: Pt able to follow exercise prescription today without complaint.  Will continue to monitor for progression.    Dr. Emily Filbert is Medical Director for [place].  Dr. Ottie Glazier is Medical Director for [facility] Pulmonary Rehabilitation.

## 2021-09-10 ENCOUNTER — Other Ambulatory Visit: Payer: Self-pay

## 2021-09-10 ENCOUNTER — Encounter: Payer: Medicare Other | Admitting: *Deleted

## 2021-09-10 DIAGNOSIS — I5042 Chronic combined systolic (congestive) and diastolic (congestive) heart failure: Secondary | ICD-10-CM | POA: Diagnosis not present

## 2021-09-10 NOTE — Progress Notes (Signed)
Daily Session Note  Patient Details  Name: Howard [name] MRN: [id number] Date of Birth: 10-22-1931 Referring Provider:   Flowsheet Row Cardiac Rehab from 05/24/2021 in [facility] Cardiac and Pulmonary Rehab  Referring Provider Gollan       Encounter Date: 09/10/2021  Check In:  Session Check In - 09/10/21 0956       Check-In   Supervising physician immediately available to respond to emergencies See telemetry face sheet for immediately available ER MD    Location [facility]-Cardiac & Pulmonary Rehab    Staff Present Renita Papa, RN BSN;Joseph [place], RCP,RRT,BSRT;Kelly [place], [place], ACSM CEP, Exercise Physiologist    Virtual Visit No    Medication changes reported     No    Fall or balance concerns reported    No    Warm-up and Cool-down Performed on first and last piece of equipment    Resistance Training Performed Yes    VAD Patient? No    PAD/SET Patient? No      Pain Assessment   Currently in Pain? No/denies                Social History   Tobacco Use  Smoking Status Former   Packs/day: 1.00   Years: 56.00   Pack years: 56.00   Types: Cigarettes   Quit date: 2011   Years since quitting: 11.7  Smokeless Tobacco Never    Goals Met:  Independence with exercise equipment Exercise tolerated well No report of concerns or symptoms today Strength training completed today  Goals Unmet:  Not Applicable  Comments: Pt able to follow exercise prescription today without complaint.  Will continue to monitor for progression.    Dr. Emily Filbert is Medical Director for [place].  Dr. Ottie Glazier is Medical Director for [facility] Pulmonary Rehabilitation.

## 2021-09-11 ENCOUNTER — Ambulatory Visit: Payer: Medicare Other | Attending: Oncology

## 2021-09-11 DIAGNOSIS — Z9181 History of falling: Secondary | ICD-10-CM | POA: Diagnosis not present

## 2021-09-11 DIAGNOSIS — M6281 Muscle weakness (generalized): Secondary | ICD-10-CM | POA: Diagnosis not present

## 2021-09-11 DIAGNOSIS — R262 Difficulty in walking, not elsewhere classified: Secondary | ICD-10-CM | POA: Insufficient documentation

## 2021-09-11 NOTE — Therapy (Signed)
[facility] [address] [place], [address] Phone: [phone]   Fax:  [phone]  Physical Therapy Evaluation  Patient Details  Name: Howard [name] MRN: [id number] Date of Birth: 21-Sep-1931 Referring Provider (PT): Earlie Server   Encounter Date: 09/11/2021   PT End of Session - 09/11/21 1227     Visit Number 1    Number of Visits 17    Date for PT Re-Evaluation 11/06/21    PT Start Time 1117    PT Stop Time 1205    PT Time Calculation (min) 48 min    Equipment Utilized During Treatment Gait belt    Activity Tolerance Patient tolerated treatment well    Behavior During Therapy [facility] for tasks assessed/performed             Past Medical History:  Diagnosis Date   Arthritis    Bell palsy    Bell's palsy 04/12/2015   Cancer [facility])    prostate and skin   Chronic combined systolic and diastolic CHF, NYHA class 1 ([place])    a. 07/2014 Echo: EF 35-40%, Gr 1 DD.   Complete heart block ([place])    a. 11/2010 s/p SJM 2210 [employer] DC PPM, ser# [id number].   Depression    Diabetes mellitus without complication ([place])    Fall 11-10-14   GERD (gastroesophageal reflux disease)    History of prostate cancer    Hyperlipidemia    Hypertension    LBBB (left bundle branch block)    Left-sided Bell's palsy    Lung cancer ([place]) 2016   NICM (nonischemic cardiomyopathy) ([place])    a. 07/2014 Echo: EF 35-40%, mid-apicalanteroseptal DK, Gr 1 DD, mild-mod dil LA.   Non-obstructive CAD    a. 07/2014 Abnl MV;  b. 08/2014 Cath: LM nl, LAD 30p, RI 40p, LCX nl, OM1 40, RCA dominant 30p, 70d-->Med Rx.   Poor balance    Presence of permanent cardiac pacemaker    Sleep apnea    a. cpap   Vertigo    WPW (Wolff-Parkinson-White syndrome)    a. S/P RFCA 1991.    Past Surgical History:  Procedure Laterality Date   ABDOMINAL AORTIC ENDOVASCULAR STENT GRAFT  08/25/2019   Procedure: ABDOMINAL AORTIC ENDOVASCULAR STENT GRAFT;  Surgeon: Algernon Huxley, MD;  Location: [facility] ORS;  Service: Vascular;;   ANGIOPLASTY Left 08/25/2019   Procedure: ANGIOPLASTY;  Surgeon: Algernon Huxley, MD;  Location: [facility] ORS;  Service: Vascular;  Laterality: Left;  left SFA and stent placement   APPLICATION OF WOUND VAC Left 06/07/2015   Procedure: APPLICATION OF WOUND VAC;  Surgeon: Robert Bellow, MD;  Location: [facility] ORS;  Service: General;  Laterality: Left;  left upper back   [place]  08/26/2014   Single vessel obstructive CAD   CARPAL TUNNEL RELEASE  04-04-15   [facility]   CATARACT EXTRACTION  07-31-11 and 09-18-11   Catheter ablation  1991   for WPW   cervical fusion     CHOLECYSTECTOMY  09-07-14   ENDARTERECTOMY FEMORAL Left 08/25/2019   Procedure: ENDARTERECTOMY FEMORAL;  Surgeon: Algernon Huxley, MD;  Location: [facility] ORS;  Service: Vascular;  Laterality: Left;  common and produndis    ENDOVASCULAR REPAIR/STENT GRAFT Right 08/25/2019   Procedure: ENDOVASCULAR REPAIR/STENT GRAFT;  Surgeon: Algernon Huxley, MD;  Location: [facility] ORS;  Service: Vascular;  Laterality: Right;  renal artery   HAND  SURGERY     right 1993; left 2005   HERNIA REPAIR  1955   INSERT / REPLACE / REMOVE PACEMAKER     INSERTION OF ILIAC STENT Bilateral 08/25/2019   Procedure: INSERTION OF ILIAC STENT;  Surgeon: Algernon Huxley, MD;  Location: [facility] ORS;  Service: Vascular;  Laterality: Bilateral;   JOINT REPLACEMENT Left 2013   knee   JOINT REPLACEMENT Right 2004   knee   KNEE SURGERY     left knee 1991 and 1992; right knee 1995   LEFT HEART CATHETERIZATION WITH CORONARY ANGIOGRAM N/A 08/26/2014   Procedure: LEFT HEART CATHETERIZATION WITH CORONARY ANGIOGRAM;  Surgeon: [name], MD;  Location: [facility] CATH LAB;  Service: Cardiovascular;  Laterality: N/A;   LOWER EXTREMITY ANGIOGRAPHY Left 08/23/2019   Procedure: Lower Extremity Angiography;  Surgeon: Algernon Huxley, MD;  Location: [facility] CV LAB;  Service: Cardiovascular;  Laterality: Left;   LUMBAR  LAMINECTOMY/DECOMPRESSION MICRODISCECTOMY N/A 06/07/2014   Procedure: LUMBAR FOUR TO FIVE LUMBAR LAMINECTOMY/DECOMPRESSION MICRODISCECTOMY 1 LEVEL;  Surgeon: [name], MD;  Location: [facility] NEURO ORS;  Service: Neurosurgery;  Laterality: N/A;   LUNG BIOPSY Right 2016   Dr [name]   MOHS SURGERY     PACEMAKER INSERTION     PPM-- St Jude 11/30/10 by [name]   PPM GENERATOR CHANGEOUT N/A 07/09/2019   Procedure: PPM GENERATOR CHANGEOUT;  Surgeon: Deboraha Sprang, MD;  Location: [place] CV LAB;  Service: Cardiovascular;  Laterality: N/A;   PROSTATE SURGERY     cancer--1998, prostatectomy   REPLACEMENT TOTAL KNEE     2004   RIGHT/LEFT HEART CATH AND CORONARY ANGIOGRAPHY Bilateral 04/13/2021   Procedure: RIGHT/LEFT HEART CATH AND CORONARY ANGIOGRAPHY;  Surgeon: Wellington Hampshire, MD;  Location: [place] CV LAB;  Service: Cardiovascular;  Laterality: Bilateral;   ruptured disc     1962 and 1998   TEE WITHOUT CARDIOVERSION N/A 09/01/2019   Procedure: TRANSESOPHAGEAL ECHOCARDIOGRAM (TEE);  Surgeon: [name], MD;  Location: [facility] ORS;  Service: Cardiovascular;  Laterality: N/A;   TEMPORARY PACEMAKER N/A 07/09/2019   Procedure: TEMPORARY PACEMAKER;  Surgeon: Deboraha Sprang, MD;  Location: [place] CV LAB;  Service: Cardiovascular;  Laterality: N/A;   TRIGGER FINGER RELEASE  01-24-15   WOUND DEBRIDEMENT Left 06/07/2015   Procedure: DEBRIDEMENT WOUND;  Surgeon: Robert Bellow, MD;  Location: [facility] ORS;  Service: General;  Laterality: Left;  left upper back    There were no vitals filed for this visit.    Subjective Assessment - 09/11/21 1119     Subjective Pt is a 87 y.o. male referred to PT for imbalance and strength. Pt reports having a fall exactly one week ago today due to "blacking out" when standing up. Vitals were stable with EMS and denied need for it. Denies any further falls. Pt reports prior to receiving rollator, he was a furniture walker at home. Has been in cardiac rehab for CHF  and has not been walking due to fear of falling. Reports being more active since rollator. Pt has extensive PMH with back and cardiac operations with most recent being a heart catheterization in April. Pt unable to trust himself and is overall scared of falling due to poor balance.    Patient is accompained by: Family member    Pertinent History Pt is a 87 y.o. male referred to PT for imbalance and strength. Pt reports having a fall exactly one week ago today due to "blacking out" when standing  up. Vitals were stable with EMS and denied need for it. Denies any further falls. Pt reports prior to receiving rollator, he was a furniture walker at home. Has been in cardiac rehab for CHF and has not been walking due to fear of falling because he did not have his rollator yet. Reports being more active since rollator for the last month or so. At home pt has adequate night light set up to assist pt in getting to bathroom in the middle of the night safely. Pt has extensive PMH with low back and cardiac operations with most recent being a heart catheterization in April. Pt unable to trust himself and is overall scared of falling due to poor balance. Pt's goal is to improve safety with ambulation.    Limitations Sitting;House hold activities;Lifting    How long can you sit comfortably? no issues    How long can you stand comfortably? unsure    How long can you walk comfortably? 1 lap around cardiac rehab loop.    Patient Stated Goals Improve balance and walking    Currently in Pain? Yes    Pain Location Back    Pain Orientation Left    Pain Descriptors / Indicators Aching    Pain Type Chronic pain    Pain Onset More than a month ago    Pain Frequency Intermittent                [facility] PT Assessment - 09/11/21 0001       Assessment   Medical Diagnosis Imbalance    Referring Provider (PT) Earlie Server    Hand Dominance Right    Prior Therapy Yes      Precautions   Precautions Fall;ICD/Pacemaker       Balance Screen   Has the patient fallen in the past 6 months Yes    How many times? 1    Has the patient had a decrease in activity level because of a fear of falling?  Yes    Is the patient reluctant to leave their home because of a fear of falling?  No      Home Environment   Living Environment Private residence    Living Arrangements Spouse/significant other    Available Help at Discharge Family    Type of [place] to enter    Entrance Stairs-Number of Steps 3 with bilateral rails    Entrance Stairs-Rails Can reach both    Home Layout Two level;Laundry or work area in basement      Prior Function   Level of Independence Independent with basic ADLs    Vocation Retired            Vitals:  97% HR: 76 BPM  BP: 121/61 mm Hg   OBJECTIVE  MUSCULOSKELETAL: Tremor: Absent Bulk: Normal Tone: Normal  Posture Kyphotic in sitting and standing to rollator. Forward head posture and rounded shoulders.  Gait No gross abnormalities in gait noted  Strength R/L 3+/3 Hip flexion 4-/4- Hip abduction (sitting) 4/4 Hip adduction 4/4- Knee extension 5/5 Knee flexion 5/5 Ankle Plantarflexion 4/4- Ankle Dorsiflexion   NEUROLOGICAL:  Mental Status Patient's fund of knowledge is within normal limits for educational level.  Sensation Grossly intact to light touch bilateral LEs as determined by testing dermatomes L2-S2 respectively except at L4 on LLE  Proprioception and hot/cold testing deferred on this date     FUNCTIONAL OUTCOME MEASURES   Results Comments  BERG NT  DGI NT   FGA NT   TUG 16.07 seconds Fall risk, in need of intervention  30 sec STS 8 reps  Cut off: 11 reps for age  32 Minute Walk Test NT   10 Meter Gait Speed Self-selected (no AD):  = 0.65 m/s; With AD= 0.70 m/s Below normative values for full community ambulation  ABC Scale NT   DHI NT     PLAN Next Visit: FOTO, DGI/FGA HEP: Next visit   Treatment Pt and spouse  educated on safe use of rollator with transfers and ambulation using brakes and safe hand placement. Pt's rollator height adjusted for improved upright posture and prevent anterior LOB. Required VC's throughout for use of brakes and hand placement to optimize safety. Initial PT demo performed.      Objective measurements completed on examination: See above findings.       PT Education - 09/11/21 1226     Education Details Safe use of rollator with transfers and ambulation; POC    Person(s) Educated Patient;Spouse    Methods Explanation;Demonstration;Verbal cues;Tactile cues    Comprehension Verbalized understanding;Returned demonstration;Verbal cues required                  PT Long Term Goals - 09/11/21 1240       PT LONG TERM GOAL #1   Title Pt will improve TUG to < 12 sec to decrease risk of falls with household transfers and ambulation tasks.    Baseline 9/20: 16.07 secs    Time 8    Period Weeks    Status New    Target Date 11/06/21      PT LONG TERM GOAL #2   Title Pt will improve 30 sec STS to 11 or greater to indicate clinically significant improvement in LE strength and cardiopulmonary endurance.    Baseline 9/20: 8 reps    Time 8    Period Weeks    Status New    Target Date 11/06/21      PT LONG TERM GOAL #3   Title Pt will improve self selected gait speed to > 0.8 m/s with no AD to demo safe household ambulation speed decreasing risk of falls.    Baseline 9/20: 0.65 m/s    Time 8    Period Weeks    Status New    Target Date 11/06/21      PT LONG TERM GOAL #4   Title Pt will improve FOTO score to target to display perceived improvement with functional mobility.    Baseline 9/20: next session    Time 8    Period Weeks    Status New    Target Date 11/06/21                    Plan - 09/11/21 1228     Clinical Impression Statement Pt is a pleasant 87 y.o. male referred to PT for strength and balance. Current limitations prevent pt  from safe household and community ambulation tasks without use of rollator. Pt presents with increased risk of falls and limited cardiopulmonary endurance as evidenced via TUG, 30 sec STS test, and 10 meter gait speed. Pt also presents with BLE weakness and decreased sensation on L foot from extensive low back and cardiac operations. Proprioception however is intact bilaterally. Pt and spouse educated thoroughly on safe use of rollator with transfers and ambulation to prevent falls requiring VC's for accuracy of completion. Rollator height adjusted to improve upright posture to prevent  anterior LOB. Pt will benefit from skilled PT once complet ewith cardiac rehab to improve balance, strength, and endurance to decreas risk of falls with household and community walking tasks.    Personal Factors and Comorbidities Age;Comorbidity 3+;Fitness;Past/Current Experience;Time since onset of injury/illness/exacerbation    Comorbidities Multiple spinal surgeries, Arthritis, Diabetes, heart catheterization.    Examination-Activity Limitations Locomotion Level;Stand;Stairs    Examination-Participation Restrictions Cleaning;Community Activity;Laundry;Yard Work    Biomedical scientist High    Rehab Potential Good    PT Frequency 2x / week    PT Duration 8 weeks    PT Treatment/Interventions ADLs/Self Care Home Management;Canalith Repostioning;Cryotherapy;Moist Heat;DME Instruction;Gait training;Stair training;Functional mobility training;Therapeutic activities;Therapeutic exercise;Balance training;Neuromuscular re-education;Patient/family education;Manual techniques;Passive range of motion;Dry needling;Vestibular    PT Next Visit Plan Re-eval? DGI/FGA    PT Home Exercise Plan To be initiated next visit    Consulted and Agree with Plan of Care Patient;Family member/caregiver             Patient will benefit from skilled therapeutic  intervention in order to improve the following deficits and impairments:  Abnormal gait, Decreased knowledge of use of DME, Improper body mechanics, Pain, Impaired sensation, Cardiopulmonary status limiting activity, Postural dysfunction, Decreased activity tolerance, Decreased endurance, Decreased strength, Decreased balance, Difficulty walking, Decreased safety awareness  Visit Diagnosis: History of falling  Difficulty in walking, not elsewhere classified  Muscle weakness (generalized)     Problem List Patient Active Problem List   Diagnosis Date Noted   Chronic systolic congestive heart failure ([place]) 07/18/2021   Goals of care, counseling/discussion 04/16/2021   Mediastinal lymphadenopathy 04/16/2021   Heart failure with reduced ejection fraction ([place])    AAA (abdominal aortic aneurysm) without rupture ([place]) 12/10/2019   Renal artery stenosis ([place]) 12/10/2019   PAD (peripheral artery disease) ([place]) 12/10/2019   Thromboembolism ([place]) 08/30/2019   Atherosclerosis of native arteries of extremity with intermittent claudication ([place]) 08/23/2019   Swelling of limb 08/17/2019   Pain in limb 08/17/2019   NICM (nonischemic cardiomyopathy) ([place]) 07/01/2019   CHF (congestive heart failure) ([place]) 07/01/2019   Malignant neoplasm of right lung ([place]) 04/18/2016   CAD (coronary artery disease) 04/01/2016   Adenocarcinoma ([place]) 04/01/2016   Skin cyst 12/26/2015   GERD (gastroesophageal reflux disease) 06/16/2015   Abscess of back 06/06/2015   Vertigo 03/27/2015   Carpal tunnel syndrome 10/28/2014   Status post cholecystectomy 09/29/2014   Disease of digestive tract 09/29/2014   Cardiomyopathy ([place]) 09/26/2014   Type 2 diabetes mellitus without complications ([place])    Sleep apnea    Spinal stenosis, lumbar region, with neurogenic claudication 06/07/2014   Lumbar stenosis with neurogenic claudication 06/07/2014   Abnormal gait 08/20/2012   H/O total knee replacement 08/20/2012   Arthritis  of knee, degenerative 08/20/2012   Pacemaker-St.Jude 08/03/2012   Cardiac conduction disorder 06/19/2012   Acid reflux 06/18/2012   Nodal rhythm disorder 06/18/2012   Triggering of digit 03/25/2012   Hyperlipidemia 12/23/2011   Essential hypertension 03/23/2011   Atrioventricular block, complete ([place]) 03/23/2011   Complete atrioventricular block ([place]) 03/23/2011    Salem Caster. Fairly IV, PT, DPT Physical Therapist- [facility]   09/11/2021, 1:17 PM  [facility] [address], [place], [address] Phone: [phone]   Fax:  [phone]  Name: Howard [name] MRN: [id number] Date of Birth: 09-02-1931

## 2021-09-12 ENCOUNTER — Other Ambulatory Visit: Payer: Self-pay

## 2021-09-12 ENCOUNTER — Ambulatory Visit
Admission: RE | Admit: 2021-09-12 | Discharge: 2021-09-12 | Disposition: A | Discharge status: Home / Self Care | Payer: Medicare Other | Source: Ambulatory Visit | Attending: Oncology | Admitting: Oncology

## 2021-09-12 DIAGNOSIS — I700 Atherosclerosis of aorta: Secondary | ICD-10-CM | POA: Diagnosis not present

## 2021-09-12 DIAGNOSIS — J439 Emphysema, unspecified: Secondary | ICD-10-CM | POA: Diagnosis not present

## 2021-09-12 DIAGNOSIS — R911 Solitary pulmonary nodule: Secondary | ICD-10-CM | POA: Diagnosis not present

## 2021-09-12 DIAGNOSIS — C3490 Malignant neoplasm of unspecified part of unspecified bronchus or lung: Secondary | ICD-10-CM | POA: Insufficient documentation

## 2021-09-14 ENCOUNTER — Other Ambulatory Visit: Payer: Self-pay

## 2021-09-14 ENCOUNTER — Ambulatory Visit: Payer: Medicare Other | Admitting: Physical Therapy

## 2021-09-14 DIAGNOSIS — I5042 Chronic combined systolic (congestive) and diastolic (congestive) heart failure: Secondary | ICD-10-CM | POA: Diagnosis not present

## 2021-09-14 NOTE — Progress Notes (Signed)
Daily Session Note  Patient Details  Name: Howard [name] MRN: [id number] Date of Birth: 07-22-1931 Referring Provider:   Flowsheet Row Cardiac Rehab from 05/24/2021 in [facility] Cardiac and Pulmonary Rehab  Referring Provider Gollan       Encounter Date: 09/14/2021  Check In:  Session Check In - 09/14/21 1025       Check-In   Supervising physician immediately available to respond to emergencies See telemetry face sheet for immediately available ER MD    Location [facility]-Cardiac & Pulmonary Rehab    Staff Present Hope Budds, RDN, LDN;Jessica Luan Pulling, MA, RCEP, CCRP, CCET;Leylany Nored, RN, BSN    Virtual Visit No    Medication changes reported     No    Fall or balance concerns reported    No    Warm-up and Cool-down Performed on first and last piece of equipment    Resistance Training Performed Yes    VAD Patient? No    PAD/SET Patient? No      Pain Assessment   Currently in Pain? No/denies                Social History   Tobacco Use  Smoking Status Former   Packs/day: 1.00   Years: 56.00   Pack years: 56.00   Types: Cigarettes   Quit date: 2011   Years since quitting: 11.7  Smokeless Tobacco Never    Goals Met:  Independence with exercise equipment Exercise tolerated well No report of concerns or symptoms today Strength training completed today  Goals Unmet:  Not Applicable  Comments: Pt able to follow exercise prescription today without complaint.  Will continue to monitor for progression.   Dr. Emily Filbert is Medical Director for [place].  Dr. Ottie Glazier is Medical Director for [facility] Pulmonary Rehabilitation.

## 2021-09-17 ENCOUNTER — Encounter: Payer: Medicare Other | Admitting: Physical Therapy

## 2021-09-17 ENCOUNTER — Encounter: Payer: Medicare Other | Admitting: *Deleted

## 2021-09-17 ENCOUNTER — Other Ambulatory Visit: Payer: Self-pay

## 2021-09-17 DIAGNOSIS — I5042 Chronic combined systolic (congestive) and diastolic (congestive) heart failure: Secondary | ICD-10-CM

## 2021-09-17 NOTE — Progress Notes (Signed)
Daily Session Note  Patient Details  Name: Howard [name] MRN: [id number] Date of Birth: 09/20/1931 Referring Provider:   Flowsheet Row Cardiac Rehab from 05/24/2021 in [facility] Cardiac and Pulmonary Rehab  Referring Provider Gollan       Encounter Date: 09/17/2021  Check In:  Session Check In - 09/17/21 1007       Check-In   Supervising physician immediately available to respond to emergencies See telemetry face sheet for immediately available ER MD    Location [facility]-Cardiac & Pulmonary Rehab    Staff Present Nyoka Cowden, RN, BSN, Jennye Moccasin, MPA, Mauricia Area, BS, ACSM CEP, Exercise Physiologist    Virtual Visit No    Medication changes reported     No    Fall or balance concerns reported    No    Tobacco Cessation Use Increase    Warm-up and Cool-down Performed on first and last piece of equipment    Resistance Training Performed Yes    VAD Patient? No    PAD/SET Patient? No      Pain Assessment   Currently in Pain? No/denies                Social History   Tobacco Use  Smoking Status Former   Packs/day: 1.00   Years: 56.00   Pack years: 56.00   Types: Cigarettes   Quit date: 2011   Years since quitting: 11.7  Smokeless Tobacco Never    Goals Met:  Independence with exercise equipment Exercise tolerated well No report of concerns or symptoms today  Goals Unmet:  Not Applicable  Comments: Pt able to follow exercise prescription today without complaint.  Will continue to monitor for progression.    Dr. Emily Filbert is Medical Director for [place].  Dr. Ottie Glazier is Medical Director for [facility] Pulmonary Rehabilitation.

## 2021-09-18 ENCOUNTER — Inpatient Hospital Stay: Payer: Medicare Other | Attending: Oncology

## 2021-09-18 ENCOUNTER — Other Ambulatory Visit: Payer: Medicare Other

## 2021-09-18 ENCOUNTER — Inpatient Hospital Stay (HOSPITAL_BASED_OUTPATIENT_CLINIC_OR_DEPARTMENT_OTHER): Payer: Medicare Other | Admitting: Oncology

## 2021-09-18 ENCOUNTER — Encounter: Payer: Self-pay | Admitting: Oncology

## 2021-09-18 VITALS — BP 118/68 | HR 81 | Temp 97.3°F | Resp 18 | Wt 183.6 lb

## 2021-09-18 DIAGNOSIS — Z79899 Other long term (current) drug therapy: Secondary | ICD-10-CM | POA: Diagnosis not present

## 2021-09-18 DIAGNOSIS — Z882 Allergy status to sulfonamides status: Secondary | ICD-10-CM | POA: Insufficient documentation

## 2021-09-18 DIAGNOSIS — Z8349 Family history of other endocrine, nutritional and metabolic diseases: Secondary | ICD-10-CM | POA: Insufficient documentation

## 2021-09-18 DIAGNOSIS — Z8546 Personal history of malignant neoplasm of prostate: Secondary | ICD-10-CM | POA: Diagnosis not present

## 2021-09-18 DIAGNOSIS — D649 Anemia, unspecified: Secondary | ICD-10-CM

## 2021-09-18 DIAGNOSIS — Z8249 Family history of ischemic heart disease and other diseases of the circulatory system: Secondary | ICD-10-CM | POA: Insufficient documentation

## 2021-09-18 DIAGNOSIS — Z9049 Acquired absence of other specified parts of digestive tract: Secondary | ICD-10-CM | POA: Diagnosis not present

## 2021-09-18 DIAGNOSIS — I700 Atherosclerosis of aorta: Secondary | ICD-10-CM | POA: Insufficient documentation

## 2021-09-18 DIAGNOSIS — C3411 Malignant neoplasm of upper lobe, right bronchus or lung: Secondary | ICD-10-CM | POA: Diagnosis not present

## 2021-09-18 DIAGNOSIS — Z7901 Long term (current) use of anticoagulants: Secondary | ICD-10-CM | POA: Insufficient documentation

## 2021-09-18 DIAGNOSIS — R634 Abnormal weight loss: Secondary | ICD-10-CM | POA: Diagnosis not present

## 2021-09-18 DIAGNOSIS — I2510 Atherosclerotic heart disease of native coronary artery without angina pectoris: Secondary | ICD-10-CM | POA: Insufficient documentation

## 2021-09-18 DIAGNOSIS — Z923 Personal history of irradiation: Secondary | ICD-10-CM | POA: Diagnosis not present

## 2021-09-18 DIAGNOSIS — Z87891 Personal history of nicotine dependence: Secondary | ICD-10-CM | POA: Insufficient documentation

## 2021-09-18 DIAGNOSIS — Z9079 Acquired absence of other genital organ(s): Secondary | ICD-10-CM | POA: Diagnosis not present

## 2021-09-18 DIAGNOSIS — C778 Secondary and unspecified malignant neoplasm of lymph nodes of multiple regions: Secondary | ICD-10-CM | POA: Insufficient documentation

## 2021-09-18 DIAGNOSIS — G4733 Obstructive sleep apnea (adult) (pediatric): Secondary | ICD-10-CM | POA: Insufficient documentation

## 2021-09-18 DIAGNOSIS — R5383 Other fatigue: Secondary | ICD-10-CM

## 2021-09-18 DIAGNOSIS — C3490 Malignant neoplasm of unspecified part of unspecified bronchus or lung: Secondary | ICD-10-CM

## 2021-09-18 DIAGNOSIS — J432 Centrilobular emphysema: Secondary | ICD-10-CM | POA: Insufficient documentation

## 2021-09-18 DIAGNOSIS — E119 Type 2 diabetes mellitus without complications: Secondary | ICD-10-CM | POA: Insufficient documentation

## 2021-09-18 DIAGNOSIS — I5042 Chronic combined systolic (congestive) and diastolic (congestive) heart failure: Secondary | ICD-10-CM | POA: Diagnosis not present

## 2021-09-18 DIAGNOSIS — I110 Hypertensive heart disease with heart failure: Secondary | ICD-10-CM | POA: Insufficient documentation

## 2021-09-18 DIAGNOSIS — D509 Iron deficiency anemia, unspecified: Secondary | ICD-10-CM

## 2021-09-18 LAB — COMPREHENSIVE METABOLIC PANEL
ALT: 14 U/L (ref 0–44)
AST: 19 U/L (ref 15–41)
Albumin: 4.1 g/dL (ref 3.5–5.0)
Alkaline Phosphatase: 47 U/L (ref 38–126)
Anion gap: 9 (ref 5–15)
BUN: 27 mg/dL — ABNORMAL HIGH (ref 8–23)
CO2: 19 mmol/L — ABNORMAL LOW (ref 22–32)
Calcium: 9.5 mg/dL (ref 8.9–10.3)
Chloride: 109 mmol/L (ref 98–111)
Creatinine, Ser: 1.27 mg/dL — ABNORMAL HIGH (ref 0.61–1.24)
GFR, Estimated: 54 mL/min — ABNORMAL LOW (ref >60)
Glucose, Bld: 164 mg/dL — ABNORMAL HIGH (ref 70–99)
Potassium: 4.7 mmol/L (ref 3.5–5.1)
Sodium: 137 mmol/L (ref 135–145)
Total Bilirubin: 0.5 mg/dL (ref 0.3–1.2)
Total Protein: 7.3 g/dL (ref 6.5–8.1)

## 2021-09-18 LAB — IRON AND TIBC
Iron: 87 ug/dL (ref 45–182)
Saturation Ratios: 23 % (ref 17.9–39.5)
TIBC: 375 ug/dL (ref 250–450)
UIBC: 288 ug/dL

## 2021-09-18 LAB — CBC WITH DIFFERENTIAL/PLATELET
Abs Immature Granulocytes: 0.02 10*3/uL (ref 0.00–0.07)
Basophils Absolute: 0 10*3/uL (ref 0.0–0.1)
Basophils Relative: 1 %
Eosinophils Absolute: 0.2 10*3/uL (ref 0.0–0.5)
Eosinophils Relative: 3 %
HCT: 36.2 % — ABNORMAL LOW (ref 39.0–52.0)
Hemoglobin: 11.7 g/dL — ABNORMAL LOW (ref 13.0–17.0)
Immature Granulocytes: 0 %
Lymphocytes Relative: 23 %
Lymphs Abs: 1.5 10*3/uL (ref 0.7–4.0)
MCH: 27.3 pg (ref 26.0–34.0)
MCHC: 32.3 g/dL (ref 30.0–36.0)
MCV: 84.6 fL (ref 80.0–100.0)
Monocytes Absolute: 0.7 10*3/uL (ref 0.1–1.0)
Monocytes Relative: 10 %
Neutro Abs: 4.1 10*3/uL (ref 1.7–7.7)
Neutrophils Relative %: 63 %
Platelets: 240 10*3/uL (ref 150–400)
RBC: 4.28 MIL/uL (ref 4.22–5.81)
RDW: 18.5 % — ABNORMAL HIGH (ref 11.5–15.5)
WBC: 6.5 10*3/uL (ref 4.0–10.5)
nRBC: 0 % (ref 0.0–0.2)

## 2021-09-18 LAB — FERRITIN: Ferritin: 62 ng/mL (ref 24–336)

## 2021-09-18 MED ORDER — MEGESTROL ACETATE 40 MG PO TABS: 40.0000 mg | ORAL_TABLET | Freq: Two times a day (BID) | ORAL | 2 refills | Status: AC

## 2021-09-18 NOTE — Progress Notes (Signed)
Hematology/Oncology follow up note [facility] [phone] [phone]   Patient Care Team: Jerrol Banana., MD as PCP - General (Family Medicine) [name], MD as PCP - Cardiology (Cardiology) Deboraha Sprang, MD as PCP - Electrophysiology (Cardiology) Dasher, [name], MD as Consulting Physician (Dermatology) Birder Robson, MD as Referring Physician (Ophthalmology) Lucky Cowboy Erskine Squibb, MD as Referring Physician (Vascular Surgery) Carloyn Manner, MD as Referring Physician (Otolaryngology) Noreene Filbert, MD as Referring Physician (Radiation Oncology) Erby Pian, MD as Referring Physician (Specialist)  REFERRING PROVIDER: Margo Common, PA-C  CHIEF COMPLAINTS/REASON FOR VISIT:  Follow up for lung cancer  HISTORY OF PRESENTING ILLNESS:   Howard [name] is a  87 y.o.  male with PMH listed below was seen in consultation at the request of  Chrismon, [name], PA-C  for evaluation of lung cancer Patient was accompanied by his son. 10/07/2015 CT chest without contrast showed suspicious anterior right upper lobe spiculated nodule.  1.3 cm 10/26/2015, right upper lobe spiculated lung mass biopsy-adenocarcinoma with acinar pattern. December 2016,-SBRT to the right upper lobe Patient follows up with Dr. Baruch Gouty for post monitoring CT surveillance. 06/05/2020, CT chest with contrast showed enlarging irregular solid right lower lobe 1.6 cm pulmonary nodule.  Worrisome for malignancy. 06/19/2020, PET scan showed pleural-based nodule with central calcification noted to be enlarging at the recent CT and the maximum of SUV of 2.7.  Low-grade malignancy is difficult to exclude. 09/27/2020, CT chest with contrast showed no significant changes in the appearance of the solid right lower lobe pulmonary lesion. 04/02/2021, CT chest with contrast showed interval enlargement of the spiculated nodule of the right lower lobe, 2.5 x 2.1 cm,  previously 1.7 x 1.3 cm.  Interval enlargement of right hilar, subcarinal, pretracheal and S2.  Right paratracheal lymph nodes.  Largest subcarinal node measuring 4 x 2.2 cm coronary artery disease 04/12/2021, 2.7 x 2.0 cm hypermetabolic right lower lobe pulmonary nodule, with bilateral mediastinal lymphadenopathy.  No definitive signs of remote metastatic disease.  Concerning for primary bronchogenic carcinoma.  T1c N3 M0.-Aortic atherosclerosis injection to left main/three-vessel coronary artery disease.  Calcification of aortic valve and mitral valve/annulus.  Patient was referred by Dr. Baruch Gouty to establish care with oncology. Patient lives at home with his wife.  He walks with a cane. He sees pulmonology Dr. Patsey Berthold for COPD, OSA on CPAP. Appetite is fair.  No intentional weight loss  chronic shortness of breath with exertion   INTERVAL HISTORY Howard [name] is a 87 y.o. male who has above history reviewed by me today presents for follow up visit for management for presumed stage III lung cancer.   S/p Radiation for treatment of presumed lung cancer. Chemotherapy was not offered due to his poor performance status and multiple comorbidities.   Today patient was accompanied by his wife. Appetite has improved after taking Megace 80 mg twice daily. Denies any fever, chills, nausea vomiting dysphagia. He follows up with cardiology. Wife reports the patient has been feeling cold chronically. His weight has been stable.  Gained a few pounds.  Chronic shortness of breath with exertion.  Patient is on oxygen at night and uses as needed during the daytime.  Review of Systems  Constitutional:  Positive for fatigue. Negative for appetite change, chills, fever and unexpected weight change.  HENT:   Negative for hearing loss and voice change.   Eyes:  Negative for eye problems and icterus.  Respiratory:  Positive for shortness  of breath. Negative for chest tightness and cough.   Cardiovascular:   Negative for chest pain and leg swelling.  Gastrointestinal:  Negative for abdominal distention and abdominal pain.  Endocrine: Negative for hot flashes.  Genitourinary:  Negative for difficulty urinating, dysuria and frequency.   Musculoskeletal:  Negative for arthralgias.  Skin:  Negative for itching and rash.  Neurological:  Negative for light-headedness and numbness.  Hematological:  Negative for adenopathy. Does not bruise/bleed easily.  Psychiatric/Behavioral:  Negative for confusion.    MEDICAL HISTORY:  Past Medical History:  Diagnosis Date   Arthritis    Bell palsy    Bell's palsy 04/12/2015   Cancer [facility])    prostate and skin   Chronic combined systolic and diastolic CHF, NYHA class 1 ([place])    a. 07/2014 Echo: EF 35-40%, Gr 1 DD.   Complete heart block ([place])    a. 11/2010 s/p SJM 2210 [employer] DC PPM, ser# [id number].   Depression    Diabetes mellitus without complication ([place])    Fall 11-10-14   GERD (gastroesophageal reflux disease)    History of prostate cancer    Hyperlipidemia    Hypertension    LBBB (left bundle branch block)    Left-sided Bell's palsy    Lung cancer ([place]) 2016   NICM (nonischemic cardiomyopathy) ([place])    a. 07/2014 Echo: EF 35-40%, mid-apicalanteroseptal DK, Gr 1 DD, mild-mod dil LA.   Non-obstructive CAD    a. 07/2014 Abnl MV;  b. 08/2014 Cath: LM nl, LAD 30p, RI 40p, LCX nl, OM1 40, RCA dominant 30p, 70d-->Med Rx.   Poor balance    Presence of permanent cardiac pacemaker    Sleep apnea    a. cpap   Vertigo    WPW (Wolff-Parkinson-White syndrome)    a. S/P RFCA 1991.    SURGICAL HISTORY: Past Surgical History:  Procedure Laterality Date   ABDOMINAL AORTIC ENDOVASCULAR STENT GRAFT  08/25/2019   Procedure: ABDOMINAL AORTIC ENDOVASCULAR STENT GRAFT;  Surgeon: Algernon Huxley, MD;  Location: [facility] ORS;  Service: Vascular;;   ANGIOPLASTY Left 08/25/2019   Procedure: ANGIOPLASTY;  Surgeon: Algernon Huxley, MD;  Location: [facility] ORS;  Service: Vascular;   Laterality: Left;  left SFA and stent placement   APPLICATION OF WOUND VAC Left 06/07/2015   Procedure: APPLICATION OF WOUND VAC;  Surgeon: Robert Bellow, MD;  Location: [facility] ORS;  Service: General;  Laterality: Left;  left upper back   [place]  08/26/2014   Single vessel obstructive CAD   CARPAL TUNNEL RELEASE  04-04-15   [facility]   CATARACT EXTRACTION  07-31-11 and 09-18-11   Catheter ablation  1991   for WPW   cervical fusion     CHOLECYSTECTOMY  09-07-14   ENDARTERECTOMY FEMORAL Left 08/25/2019   Procedure: ENDARTERECTOMY FEMORAL;  Surgeon: Algernon Huxley, MD;  Location: [facility] ORS;  Service: Vascular;  Laterality: Left;  common and produndis    ENDOVASCULAR REPAIR/STENT GRAFT Right 08/25/2019   Procedure: ENDOVASCULAR REPAIR/STENT GRAFT;  Surgeon: Algernon Huxley, MD;  Location: [facility] ORS;  Service: Vascular;  Laterality: Right;  renal artery   HAND SURGERY     right 1993; left 2005   [place] / REPLACE / REMOVE PACEMAKER     INSERTION OF ILIAC STENT Bilateral 08/25/2019   Procedure: INSERTION OF ILIAC STENT;  Surgeon: Algernon Huxley, MD;  Location: [facility] ORS;  Service: Vascular;  Laterality: Bilateral;   JOINT REPLACEMENT Left 2013   knee   JOINT REPLACEMENT Right 2004   knee   KNEE SURGERY     left knee 1991 and 1992; right knee 1995   LEFT HEART CATHETERIZATION WITH CORONARY ANGIOGRAM N/A 08/26/2014   Procedure: LEFT HEART CATHETERIZATION WITH CORONARY ANGIOGRAM;  Surgeon: [name], MD;  Location: [facility] CATH LAB;  Service: Cardiovascular;  Laterality: N/A;   LOWER EXTREMITY ANGIOGRAPHY Left 08/23/2019   Procedure: Lower Extremity Angiography;  Surgeon: Algernon Huxley, MD;  Location: [place] CV LAB;  Service: Cardiovascular;  Laterality: Left;   LUMBAR LAMINECTOMY/DECOMPRESSION MICRODISCECTOMY N/A 06/07/2014   Procedure: LUMBAR FOUR TO FIVE LUMBAR LAMINECTOMY/DECOMPRESSION MICRODISCECTOMY 1 LEVEL;  Surgeon: [name], MD;  Location:  [place] [facility]URO ORS;  Service: Neurosurgery;  Laterality: N/A;   LUNG BIOPSY Right 2016   Dr [name]   MOHS SURGERY     PACEMAKER INSERTION     PPM-- St Jude 11/30/10 by [name]   PPM GENERATOR CHANGEOUT N/A 07/09/2019   Procedure: PPM GENERATOR CHANGEOUT;  Surgeon: Deboraha Sprang, MD;  Location: [place] CV LAB;  Service: Cardiovascular;  Laterality: N/A;   PROSTATE SURGERY     cancer--1998, prostatectomy   REPLACEMENT TOTAL KNEE     2004   RIGHT/LEFT HEART CATH AND CORONARY ANGIOGRAPHY Bilateral 04/13/2021   Procedure: RIGHT/LEFT HEART CATH AND CORONARY ANGIOGRAPHY;  Surgeon: Wellington Hampshire, MD;  Location: [place] CV LAB;  Service: Cardiovascular;  Laterality: Bilateral;   ruptured disc     1962 and 1998   TEE WITHOUT CARDIOVERSION N/A 09/01/2019   Procedure: TRANSESOPHAGEAL ECHOCARDIOGRAM (TEE);  Surgeon: [name], MD;  Location: [facility] ORS;  Service: Cardiovascular;  Laterality: N/A;   TEMPORARY PACEMAKER N/A 07/09/2019   Procedure: TEMPORARY PACEMAKER;  Surgeon: Deboraha Sprang, MD;  Location: [place] CV LAB;  Service: Cardiovascular;  Laterality: N/A;   TRIGGER FINGER RELEASE  01-24-15   WOUND DEBRIDEMENT Left 06/07/2015   Procedure: DEBRIDEMENT WOUND;  Surgeon: Robert Bellow, MD;  Location: [facility] ORS;  Service: General;  Laterality: Left;  left upper back    SOCIAL HISTORY: Social History   Socioeconomic History   Marital status: Married    Spouse name: Not on file   Number of children: 2   Years of education: College   Highest education level: Some college, no degree  Occupational History   Occupation: Retired  Tobacco Use   Smoking status: Former    Packs/day: 1.00    Years: 56.00    Pack years: 56.00    Types: Cigarettes    Quit date: 2011    Years since quitting: 11.7   Smokeless tobacco: Never  Vaping Use   Vaping Use: Never used  Substance and Sexual Activity   Alcohol use: No   Drug use: No   Sexual activity: Not on file  Other Topics Concern   Not  on file  Social History Narrative   Drinks 2 cups of coffee a day    Social Determinants of Radio broadcast assistant Strain: Low Risk    Difficulty of Paying Living Expenses: Not hard at all  Food Insecurity: No Food Insecurity   Worried About Charity fundraiser in the Last Year: Never true   Arboriculturist in the Last Year: Never true  Transportation Needs: No Transportation Needs   Lack of Transportation (Medical): No   Lack of Transportation (Non-Medical): No  Physical Activity: Inactive  Days of Exercise per Week: 0 days   Minutes of Exercise per Session: 0 min  Stress: No Stress Concern Present   Feeling of Stress : Not at all  Social Connections: Moderately Isolated   Frequency of Communication with Friends and Family: Twice a week   Frequency of Social Gatherings with Friends and Family: Twice a week   Attends Religious Services: Never   Printmaker: No   Attends Music therapist: Never   Marital Status: Married  Human resources officer Violence: Not At Risk   Fear of Current or Ex-Partner: No   Emotionally Abused: No   Physically Abused: No   Sexually Abused: No    FAMILY HISTORY: Family History  Problem Relation Age of Onset   Heart attack Mother    Hyperlipidemia Mother    CAD Other    Prostate cancer Neg Hx    Cancer Neg Hx     ALLERGIES:  is allergic to sulfa antibiotics.  MEDICATIONS:  Current Outpatient Medications  Medication Sig Dispense Refill   acetaminophen (TYLENOL) 500 MG tablet Take 1,000 mg by mouth every 6 (six) hours as needed for moderate pain.     albuterol (VENTOLIN HFA) 108 (90 Base) MCG/ACT inhaler INHALE 2 PUFFS INTO LUNGS EVERY 6 HOURS AS NEEDED FOR WHEEZING OR SHORTNESS OF BREATH 8.5 g 6   aspirin EC 81 MG tablet Take 81 mg by mouth daily.     cetirizine (ZYRTEC) 10 MG tablet Take 10 mg by mouth daily.     Cholecalciferol (VITAMIN D3) 1000 UNITS CAPS Take 1,000 Units by mouth daily.      ELIQUIS 5 MG TABS tablet TAKE 1 TABLET TWICE A DAY  (SWITCHED FROM PLAVIX) 60 tablet 11   ezetimibe (ZETIA) 10 MG tablet TAKE 1 TABLET DAILY 90 tablet 2   Fluticasone-Umeclidin-Vilant (TRELEGY ELLIPTA) 100-62.5-25 MCG/INH AEPB Inhale 1 puff into the lungs daily. 180 each 3   furosemide (LASIX) 20 MG tablet Take 1 tablet (20 mg total) by mouth daily. 30 tablet 5   Iron-Vitamin C (VITRON-C) 65-125 MG TABS Take 1 tablet by mouth daily. 30 tablet 2   isosorbide mononitrate (IMDUR) 30 MG 24 hr tablet TAKE 1 TABLET DAILY 90 tablet 2   metFORMIN (GLUCOPHAGE) 500 MG tablet Take 1 tablet (500 mg total) by mouth 2 (two) times daily with a meal. 180 tablet 1   metoprolol succinate (TOPROL-XL) 25 MG 24 hr tablet TAKE 1 TABLET AT BEDTIME 90 tablet 2   montelukast (SINGULAIR) 10 MG tablet Take 10 mg by mouth daily as needed (sneezing/allergies.).     Multiple Vitamin (MULTIVITAMIN WITH MINERALS) TABS tablet Take 1 tablet by mouth daily. One-A-Day Multivitamin     omeprazole (PRILOSEC) 40 MG capsule TAKE 1 CAPSULE TWICE DAILY AS NEEDED 180 capsule 3   sacubitril-valsartan (ENTRESTO) 49-51 MG Take 1 tablet by mouth 2 (two) times daily. 180 tablet 3   simvastatin (ZOCOR) 40 MG tablet TAKE 1 TABLET AT BEDTIME 90 tablet 3   spironolactone (ALDACTONE) 25 MG tablet Take 1 tablet (25 mg total) by mouth daily. 90 tablet 3   donepezil (ARICEPT) 5 MG tablet Take 5 mg by mouth daily. (Patient not taking: Reported on 09/18/2021)     megestrol (MEGACE) 40 MG tablet Take 1 tablet (40 mg total) by mouth 2 (two) times daily. 60 tablet 2   promethazine-dextromethorphan (PROMETHAZINE-DM) 6.25-15 MG/5ML syrup Take 5 mLs by mouth 3 (three) times daily as needed for cough. (Patient not  taking: No sig reported) 118 mL 0   No current facility-administered medications for this visit.     PHYSICAL EXAMINATION: ECOG PERFORMANCE STATUS: 1 - Symptomatic but completely ambulatory Vitals:   09/18/21 1325  BP: 118/68  Pulse: 81  Resp:  18  Temp: (!) 97.3 F (36.3 C)   Filed Weights   09/18/21 1325  Weight: 183 lb 9.6 oz (83.3 kg)    Physical Exam Constitutional:      General: He is not in acute distress.    Comments: Patient sits in the wheelchair today.    HENT:     Head: Normocephalic and atraumatic.  Eyes:     General: No scleral icterus. Cardiovascular:     Rate and Rhythm: Normal rate and regular rhythm.     Heart sounds: Normal heart sounds.  Pulmonary:     Effort: Pulmonary effort is normal. No respiratory distress.     Breath sounds: No wheezing.  Abdominal:     General: Bowel sounds are normal. There is no distension.     Palpations: Abdomen is soft.  Musculoskeletal:        General: No deformity. Normal range of motion.     Cervical back: Normal range of motion and neck supple.  Skin:    General: Skin is warm and dry.     Findings: No erythema or rash.  Neurological:     Mental Status: He is alert and oriented to person, place, and time. Mental status is at baseline.  Psychiatric:        Mood and Affect: Mood normal.    LABORATORY DATA:  I have reviewed the data as listed Lab Results  Component Value Date   WBC 6.5 09/18/2021   HGB 11.7 (L) 09/18/2021   HCT 36.2 (L) 09/18/2021   MCV 84.6 09/18/2021   PLT 240 09/18/2021   Recent Labs    11/13/20 0833 04/09/21 1011 04/16/21 1149 05/15/21 1005 05/22/21 1023 07/03/21 1356 09/18/21 1246  NA 139   < >  --    < > 138 134* 137  K 4.6   < >  --    < > 3.9 4.1 4.7  CL 104   < >  --    < > 108 102 109  CO2 19*   < >  --    < > 19* 21* 19*  GLUCOSE 131*   < >  --    < > 128* 106* 164*  BUN 23   < >  --    < > 25* 22 27*  CREATININE 1.24   < >  --    < > 1.24 1.20 1.27*  CALCIUM 9.4   < >  --    < > 8.7* 8.9 9.5  GFRNONAA 52*   < >  --    < > 56* 58* 54*  GFRAA 60  --   --   --   --   --   --   PROT 6.7  --  6.9  --   --  7.3 7.3  ALBUMIN 4.2  --  3.5  --   --  3.8 4.1  AST 16  --  22  --   --  23 19  ALT 10  --  13  --   --  12 14   ALKPHOS 54  --  48  --   --  56 47  BILITOT 0.5  --  0.8  --   --  0.8 0.5  BILIDIR  --   --  0.1  --   --   --   --   IBILI  --   --  0.7  --   --   --   --    < > = values in this interval not displayed.    Iron/TIBC/Ferritin/ %Sat    Component Value Date/Time   IRON 87 09/18/2021 1246   TIBC 375 09/18/2021 1246   FERRITIN 62 09/18/2021 1246   IRONPCTSAT 23 09/18/2021 1246      RADIOGRAPHIC STUDIES: I have personally reviewed the radiological images as listed and agreed with the findings in the report. CT Chest Wo Contrast  Result Date: 09/13/2021 CLINICAL DATA:  Right lower lobe lung cancer, assess treatment response, radiation therapy complete EXAM: CT CHEST WITHOUT CONTRAST TECHNIQUE: Multidetector CT imaging of the chest was performed following the standard protocol without IV contrast. COMPARISON:  CT chest, 04/02/2021 FINDINGS: Evaluation is generally somewhat limited by extensive breath motion artifact throughout. Cardiovascular: Aortic atherosclerosis. Left chest multi lead pacer. Mild cardiomegaly. Extensive 3 vessel coronary artery calcifications and stents. No pericardial effusion. Mediastinum/Nodes: Interval resolution of previously noted right hilar, subcarinal, and pretracheal lymphadenopathy, index subcarinal nodes now measuring 2.3 x 1.2 cm, previously 4.0 x 2.2 cm when measured similarly (series 2, image 77). Thyroid gland, trachea, and esophagus demonstrate no significant findings. Lungs/Pleura: Mild centrilobular emphysema. There has been interval development of dense fibrotic consolidation and volume loss of the medial right lower lobe, which almost completely subcentimeter previously noted spiculated nodule of the right lung base. Although difficult to accurately measure given the presence of consolidation and breath motion artifact in this vicinity, this nodule now measures approximately 1.5 x 1.5 cm, previously 2.5 x 2.1 cm (series 3, image 109). A calcified subpleural  nodule of the medial superior segment right lower lobe does not appear significantly changed, although evaluation is limited by adjacent fibrosis (series 3, image 86). Partially calcified consolidation of the posterior lingula appears diminished, measuring 1.9 x 1.0 cm, previously 2.4 x 1.9 cm (series 3, image 106). No pleural effusion or pneumothorax. Upper Abdomen: No acute abnormality. Musculoskeletal: No chest wall mass or suspicious bone lesions identified. IMPRESSION: 1. Evaluation is generally somewhat limited by extensive breath motion artifact throughout. 2. There has been interval development of dense fibrotic consolidation and volume loss of the medial right lower lobe, which almost completely subsumes a previously noted spiculated, PET avid nodule of the right lung base. Although difficult to accurately measure given the presence of consolidation and breath motion artifact in this vicinity, this nodule now appears to be diminished in size. 3. Interval resolution of previously noted right hilar, subcarinal, and pretracheal lymphadenopathy. 4. Constellation of findings is consistent with treatment response of primary lung malignancy and nodal metastatic disease. 5. Partially calcified nodular consolidation of the posterior lingula appears diminished, most likely reflecting resolution of nonspecific infection or inflammation. 6. Emphysema. 7. Coronary artery disease. Emphysema (ICD10-J43.9). Electronically Signed   By: Eddie Candle M.D.   On: 09/13/2021 08:41       ASSESSMENT & PLAN:  1. Malignant neoplasm of lung, unspecified laterality, unspecified part of lung ([place])   2. Other fatigue   3. Normocytic anemia    #History of right upper lobe lung mass status post SBRT. Presumed stage IIIB. No biopsy was done due to poor respiratory function.  S/p radiation. Chemo was not offered.  09/12/2021, CT chest without contrast showed postradiation changes.  Interval resolution  of previous noted right  hilar subcarinal and pretracheal lymphadenopathy.  Consistent with treatment response.  Partially calcified nodular consolidation of the posterior lingula appears diminished.  Emphysema.  CAD. I recommend continue CT chest every 3 months.   #  decreased oral intake, weight loss His appetite has improved after taking Megace.  Tapered down to 40 mg twice daily. Continue follow-up with nutritionist. Weight is stable.   # SOB with exertion.  Chronic Probably due to CHF and emphysema. Oxygen at night.   Continue follow-up with palliative care service.  #Chronic anemia, hemoglobin 11.7.  Close to his baseline.   Orders Placed This Encounter  Procedures   CT Chest Wo Contrast    Standing Status:   Future    Standing Expiration Date:   09/18/2022    Order Specific Question:   Preferred imaging location?    Answer:   [facility]   CBC with Differential/Platelet    Standing Status:   Future    Standing Expiration Date:   09/18/2022   Comprehensive metabolic panel    Standing Status:   Future    Standing Expiration Date:   09/18/2022   TSH    Standing Status:   Future    Standing Expiration Date:   09/18/2022    All questions were answered. The patient knows to call the clinic with any problems questions or concerns.  cc Chrismon, [name], PA-C    Return of visit:  2-3 months.  Thank you for this kind referral and the opportunity to participate in the care of this patient. A copy of today's note is routed to referring provider    Earlie Server, MD, PhD Hematology Oncology [facility] Pager- [phone] 09/18/2021

## 2021-09-18 NOTE — Progress Notes (Signed)
Pt here for follow up. Pt complains of numbness to right thigh.

## 2021-09-19 ENCOUNTER — Encounter: Payer: Medicare Other | Admitting: Physical Therapy

## 2021-09-20 ENCOUNTER — Other Ambulatory Visit: Payer: Self-pay

## 2021-09-20 ENCOUNTER — Other Ambulatory Visit: Payer: Medicare Other | Admitting: Student

## 2021-09-20 DIAGNOSIS — I5022 Chronic systolic (congestive) heart failure: Secondary | ICD-10-CM

## 2021-09-20 DIAGNOSIS — C342 Malignant neoplasm of middle lobe, bronchus or lung: Secondary | ICD-10-CM | POA: Diagnosis not present

## 2021-09-20 DIAGNOSIS — R52 Pain, unspecified: Secondary | ICD-10-CM | POA: Diagnosis not present

## 2021-09-20 DIAGNOSIS — R531 Weakness: Secondary | ICD-10-CM | POA: Diagnosis not present

## 2021-09-20 DIAGNOSIS — R195 Other fecal abnormalities: Secondary | ICD-10-CM | POA: Diagnosis not present

## 2021-09-20 DIAGNOSIS — Z515 Encounter for palliative care: Secondary | ICD-10-CM

## 2021-09-20 NOTE — Progress Notes (Signed)
Therapist, nutritional Palliative Care Consult Note Telephone: [phone]  Fax: [phone]    Date of encounter: 09/20/21 9:07 AM PATIENT NAME: Howard [name] [address] [place] [address]   [phone] (home)  DOB: 1931/02/12 MRN: [id number] PRIMARY CARE PROVIDER:    Maple Hudson., MD,  [address] [place] [address] [phone]  REFERRING PROVIDER:   Maple Hudson., MD [address] [place],  [place] [address] [phone]  RESPONSIBLE PARTY:    Contact Information     Name Relation Home Work Mobile   [place] Spouse [phone]  [phone]   Awais, Cobarrubias   [phone]        I met face to face with patient and family in the home. Palliative Care was asked to follow this patient by consultation request of Chrismon, Jodell Cipro, PA-C  to address advance care planning and complex medical decision making. This is a follow up visit.                                   ASSESSMENT AND PLAN / RECOMMENDATIONS:   Advance Care Planning/Goals of Care: Goals include to maximize quality of life and symptom management.   CODE STATUS: Full Code  Symptom Management/Plan:  Pain-patient with chronic back pain. continue acetaminophen 1000 mg BID, start Lidocaine patch 4% apply to lower back, on 12 hours, off 12 hours.   Chronic combined diastolic and systolic heart failure-EF 20-25%. Patient with dyspnea secondary to his HF and COPD.Continue oxygen at 2-3 lpm PRN shortness of breath and continuously at night. Continue inhalers as directed for his COPD. Patient is currently receiving cardiac rehab. Continue as able to tolerate. Encouraged patient to weigh daily. Continue HF medications as directed. Follow up with cardiology as scheduled.  Generalized weakness-patient encouraged to use rollator for exercise. Plans to start PT once cardiac rehab is completed.   Malignant neoplasm of right lung-stage  IIIb; status post SBRT. Recent CT shows post radiation changes; consistent with treatment response. Patient to have f/u chest CT every 3 months. F/u with oncology as scheduled.   Loose stools/bowel incontinence-recommend starting metamucil to bulk stools.   Follow up Palliative Care Visit: Palliative care will continue to follow for complex medical decision making, advance care planning, and clarification of goals. Return in 6-8 weeks or prn.  I spent 45 minutes providing this consultation. More than 50% of the time in this consultation was spent in counseling and care coordination.    PPS: 50%  HOSPICE ELIGIBILITY/DIAGNOSIS: TBD  Chief Complaint: Palliative Medicine follow up visit.   HISTORY OF PRESENT ILLNESS:  Howard [name] is a 87 y.o. year old male  with chronic combined diastolic and systolic heart failure, COPD, nonischemic cardiomyopathy, T2DM, dx right lung cancer in April 2022; s/p 35 radiation treatments.  Patient reports doing okay. Wife feels he is sleeping more, fatigued. Currently participating in Heart track. Had a fall 2 weeks ago; wife states he lost his balance and did not have rollator with him. Denied feeling dizzy, lighted headed or blacking out at the time of fall. EMS responded, patient declined to go to ED. His shortness of breath is improved some with using oxygen more throughout the day; wearing oxygen PRN during the day and QHS at 3 lpm. Denies any lower extremity edema. Having incontinence of bowel in the past 2 weeks; stools are sometimes  loose. Patient seen by oncology; CT of chest without contrast on 09/12/21 showed postradiation changes. Interval resolution of previous noted right hilar subcarinal and pretracheal lymphadenopathy. Consistent with treatment response. A 10-point review of systems is negative, except for the pertinent positives and negatives detailed in the HPI.     History obtained from review of EMR, discussion with primary team, and interview  with family, facility staff/caregiver and/or Howard [name].  I reviewed available labs, medications, imaging, studies and related documents from the EMR.  Records reviewed and summarized above.    Physical Exam: Weight: 183 pounds Pulse 80, resp 20 b/p 124/72, sats 97% at 3 lpm Constitutional: NAD General: frail appearing EYES: anicteric sclera, lids intact, no discharge  ENMT: intact hearing, oral mucous membranes moist, dentition intact CV: S1S2, RRR, no LE edema Pulmonary: LCTA, no increased work of breathing, no cough, room air Abdomen: normo-active BS + 4 quadrants, soft and non tender GU: deferred MSK: moves all extremities, ambulatory with rollator Skin: warm and dry, no rashes or wounds on visible skin Neuro: generalized weakness, A & O x 3, forgetful Psych: non-anxious affect, pleasant Hem/lymph/immuno: no widespread bruising   Thank you for the opportunity to participate in the care of Howard [name].  The palliative care team will continue to follow. Please call our office at [phone] if we can be of additional assistance.   Ezekiel Slocumb, NP   COVID-19 PATIENT SCREENING TOOL Asked and negative response unless otherwise noted:   Have you had symptoms of covid, tested positive or been in contact with someone with symptoms/positive test in the past 5-10 days? No

## 2021-09-22 ENCOUNTER — Other Ambulatory Visit: Payer: Self-pay | Admitting: Family Medicine

## 2021-09-22 ENCOUNTER — Other Ambulatory Visit: Payer: Self-pay | Admitting: Cardiovascular Disease

## 2021-09-22 ENCOUNTER — Other Ambulatory Visit: Payer: Self-pay | Admitting: Internal Medicine

## 2021-09-22 DIAGNOSIS — E119 Type 2 diabetes mellitus without complications: Secondary | ICD-10-CM

## 2021-09-22 NOTE — Telephone Encounter (Signed)
Requested medication (s) are due for refill today: yes  Requested medication (s) are on the active medication list: yes  Last refill:  02/16/21 #180 1 RF  Future visit scheduled: yes  Notes to clinic:  for short term refills please send to [facility]   Requested Prescriptions  Pending Prescriptions Disp Refills   metFORMIN (GLUCOPHAGE) 500 MG tablet [Pharmacy Med Name: METFORMIN TAB 500MG] 180 tablet 1    Sig: TAKE 1 TABLET TWICE DAILY  WITH MEALS     Endocrinology:  Diabetes - Biguanides Failed - 09/22/2021  6:15 PM      Failed - Cr in normal range and within 360 days    Creat  Date Value Ref Range Status  09/30/2017 0.81 0.70 - 1.11 mg/dL Final    Comment:    For patients >72 years of age, the reference limit for Creatinine is approximately 13% higher for people identified as African-American. .    Creatinine, Ser  Date Value Ref Range Status  09/18/2021 1.27 (H) 0.61 - 1.24 mg/dL Final   Creatinine, POC  Date Value Ref Range Status  09/30/2017 NA mg/dL Final          Failed - HBA1C is between 0 and 7.9 and within 180 days    Hemoglobin A1C  Date Value Ref Range Status  11/09/2020 7.7 (A) 4.0 - 5.6 % Final   Hgb A1c MFr Bld  Date Value Ref Range Status  05/08/2020 7.4 (H) 4.8 - 5.6 % Final    Comment:             Prediabetes: 5.7 - 6.4          Diabetes: >6.4          Glycemic control for adults with diabetes: <7.0           Failed - eGFR in normal range and within 360 days    GFR, Est African American  Date Value Ref Range Status  09/30/2017 95 > OR = 60 mL/min/1.32m Final   GFR calc Af Amer  Date Value Ref Range Status  11/13/2020 60 >59 mL/min/1.73 Final    Comment:    **In accordance with recommendations from the NKF-ASN Task force,**   [facility] is in the process of updating its eGFR calculation to the   2021 CKD-EPI creatinine equation that estimates kidney function   without a race variable.    GFR, Est Non African American  Date  Value Ref Range Status  09/30/2017 82 > OR = 60 mL/min/1.763mFinal   GFR, Estimated  Date Value Ref Range Status  09/18/2021 54 (L) >60 mL/min Final    Comment:    (NOTE) Calculated using the CKD-EPI Creatinine Equation (2021)    GFR  Date Value Ref Range Status  08/24/2014 80.80 >60.00 mL/min Final          Failed - Valid encounter within last 6 months    Recent Outpatient Visits           7 months ago Chest pain, unspecified type   [facility]   8 months ago Memory loss   [facility]   10 months ago Type 2 diabetes mellitus with diabetic neuropathy, unspecified whether [facility]  [facility]   1 year ago [facility]ness of breath   [facility]   1 year ago Abnormal gait  due to peripheral sensory disorder   [place], [name], PA-C       Future Appointments             In 1 month Jerrol Banana., MD [facility], [place]   In 1 month Gollan, Kathlene November, MD [facility], [place]

## 2021-09-24 ENCOUNTER — Other Ambulatory Visit: Payer: Self-pay

## 2021-09-24 ENCOUNTER — Encounter: Payer: Medicare Other | Attending: Cardiovascular Disease

## 2021-09-24 DIAGNOSIS — Z87891 Personal history of nicotine dependence: Secondary | ICD-10-CM | POA: Diagnosis not present

## 2021-09-24 DIAGNOSIS — I5042 Chronic combined systolic (congestive) and diastolic (congestive) heart failure: Secondary | ICD-10-CM | POA: Insufficient documentation

## 2021-09-24 NOTE — Progress Notes (Signed)
Daily Session Note  Patient Details  Name: Howard [name] MRN: [id number] Date of Birth: Dec 25, 1930 Referring Provider:   Flowsheet Row Cardiac Rehab from 05/24/2021 in [facility] Cardiac [facility]  Referring Provider Gollan       Encounter Date: 09/24/2021  Check In:  Session Check In - 09/24/21 0945       Check-In   Supervising physician immediately available to respond to emergencies See telemetry face sheet for immediately available ER MD    Location [facility]-Cardiac & Pulmonary Rehab    Staff Present Birdie Sons, MPA, Elveria Rising, BA, ACSM CEP, Exercise Physiologist;Nadja Lina Amedeo Plenty, BS, ACSM CEP, Exercise Physiologist    Virtual Visit No    Medication changes reported     No    Fall or balance concerns reported    Yes    Comments fell at home, hit head and called 911, checked out, no injury    Tobacco Cessation No Change    Warm-up and Cool-down Performed on first and last piece of equipment    Resistance Training Performed Yes    VAD Patient? No    PAD/SET Patient? No      Pain Assessment   Currently in Pain? No/denies                Social History   Tobacco Use  Smoking Status Former   Packs/day: 1.00   Years: 56.00   Pack years: 56.00   Types: Cigarettes   Quit date: 2011   Years since quitting: 11.7  Smokeless Tobacco Never    Goals Met:  Independence with exercise equipment Exercise tolerated well No report of concerns or symptoms today Strength training completed today  Goals Unmet:  Not Applicable  Comments: Pt able to follow exercise prescription today without complaint.  Will continue to monitor for progression.    Dr. Emily Filbert is Medical Director for [facility].  Dr. Ottie Glazier is Medical Director for [facility]monary [facility].

## 2021-09-25 ENCOUNTER — Other Ambulatory Visit: Payer: Self-pay

## 2021-09-25 ENCOUNTER — Inpatient Hospital Stay: Payer: Medicare Other | Attending: Radiation Oncology

## 2021-09-25 NOTE — Progress Notes (Signed)
Nutrition Follow-up:  Patient with recurrent stage III adenocarcinoma of right lung s/p radiation.    Spoke with wife via phone.  Wife says that appetite is better on megace.  Drinks about 2 ensure plus shakes per day.  Eats 3 meals per day with lunch being the biggest meal.  Patient participating in Heart Track program and once it is over will be meeting with PT.      Medications: reviewed  Labs: reviewed  Anthropometrics:   Weight 183 lb 9.6 oz on 9/27 increased  180 lb on 7/27   NUTRITION DIAGNOSIS: Inadequate oral intake resolved   INTERVENTION:  Continue oral nutrition supplements for added calories and protein Continue well balanced diet including foods high in protein to prevent weight loss Wife has contact information    NEXT VISIT: no follow-up RD available as needed  Mariyana Fulop B. Zenia Resides, [place], [place] Registered Dietitian [phone] (mobile)

## 2021-09-26 ENCOUNTER — Encounter: Payer: Self-pay | Admitting: *Deleted

## 2021-09-26 DIAGNOSIS — I5042 Chronic combined systolic (congestive) and diastolic (congestive) heart failure: Secondary | ICD-10-CM

## 2021-09-26 DIAGNOSIS — Z87891 Personal history of nicotine dependence: Secondary | ICD-10-CM | POA: Diagnosis not present

## 2021-09-26 NOTE — Progress Notes (Signed)
Cardiac Individual Treatment Plan  Patient Details  Name: Howard [name] MRN: [id number] Date of Birth: 08-19-1931 Referring Provider:   Flowsheet Row Cardiac Rehab from 05/24/2021 in [facility] Cardiac and Pulmonary Rehab  Referring Provider Gollan       Initial Encounter Date:  Flowsheet Row Cardiac Rehab from 05/24/2021 in [facility] Cardiac and Pulmonary Rehab  Date 05/24/21       Visit Diagnosis: Heart failure, systolic and diastolic, chronic ([place])  Patient's Home Medications on Admission:  Current Outpatient Medications:    acetaminophen (TYLENOL) 500 MG tablet, Take 1,000 mg by mouth every 6 (six) hours as needed for moderate pain., Disp: , Rfl:    albuterol (VENTOLIN HFA) 108 (90 Base) MCG/ACT inhaler, INHALE 2 PUFFS INTO LUNGS EVERY 6 HOURS AS NEEDED FOR WHEEZING OR SHORTNESS OF BREATH, Disp: 8.5 g, Rfl: 6   aspirin EC 81 MG tablet, Take 81 mg by mouth daily., Disp: , Rfl:    cetirizine (ZYRTEC) 10 MG tablet, Take 10 mg by mouth daily., Disp: , Rfl:    Cholecalciferol (VITAMIN D3) 1000 UNITS CAPS, Take 1,000 Units by mouth daily., Disp: , Rfl:    donepezil (ARICEPT) 5 MG tablet, Take 5 mg by mouth daily. (Patient not taking: Reported on 09/18/2021), Disp: , Rfl:    ELIQUIS 5 MG TABS tablet, TAKE 1 TABLET TWICE A DAY  (SWITCHED FROM PLAVIX), Disp: 60 tablet, Rfl: 11   ezetimibe (ZETIA) 10 MG tablet, TAKE 1 TABLET DAILY, Disp: 90 tablet, Rfl: 0   Fluticasone-Umeclidin-Vilant (TRELEGY ELLIPTA) 100-62.5-25 MCG/INH AEPB, Inhale 1 puff into the lungs daily., Disp: 180 each, Rfl: 3   furosemide (LASIX) 20 MG tablet, Take 1 tablet (20 mg total) by mouth daily., Disp: 30 tablet, Rfl: 5   Iron-Vitamin C (VITRON-C) 65-125 MG TABS, Take 1 tablet by mouth daily., Disp: 30 tablet, Rfl: 2   isosorbide mononitrate (IMDUR) 30 MG 24 hr tablet, TAKE 1 TABLET DAILY, Disp: 90 tablet, Rfl: 2   megestrol (MEGACE) 40 MG tablet, Take 1 tablet (40 mg total) by mouth 2 (two) times daily., Disp: 60 tablet, Rfl: 2    metFORMIN (GLUCOPHAGE) 500 MG tablet, TAKE 1 TABLET TWICE DAILY  WITH MEALS, Disp: 60 tablet, Rfl: 0   metoprolol succinate (TOPROL-XL) 25 MG 24 hr tablet, TAKE 1 TABLET AT BEDTIME, Disp: 90 tablet, Rfl: 0   montelukast (SINGULAIR) 10 MG tablet, Take 10 mg by mouth daily as needed (sneezing/allergies.)., Disp: , Rfl:    Multiple Vitamin (MULTIVITAMIN WITH MINERALS) TABS tablet, Take 1 tablet by mouth daily. One-A-Day Multivitamin, Disp: , Rfl:    omeprazole (PRILOSEC) 40 MG capsule, TAKE 1 CAPSULE TWICE DAILY AS NEEDED, Disp: 180 capsule, Rfl: 3   promethazine-dextromethorphan (PROMETHAZINE-DM) 6.25-15 MG/5ML syrup, Take 5 mLs by mouth 3 (three) times daily as needed for cough. (Patient not taking: No sig reported), Disp: 118 mL, Rfl: 0   sacubitril-valsartan (ENTRESTO) 49-51 MG, Take 1 tablet by mouth 2 (two) times daily., Disp: 180 tablet, Rfl: 3   simvastatin (ZOCOR) 40 MG tablet, TAKE 1 TABLET AT BEDTIME, Disp: 90 tablet, Rfl: 3   spironolactone (ALDACTONE) 25 MG tablet, Take 1 tablet (25 mg total) by mouth daily., Disp: 90 tablet, Rfl: 3  Past Medical History: Past Medical History:  Diagnosis Date   Arthritis    Bell palsy    Bell's palsy 04/12/2015   Cancer (HCC)    prostate and skin   Chronic combined systolic and diastolic CHF, NYHA class 1 ([place])  a. 07/2014 Echo: EF 35-40%, Gr 1 DD.   Complete heart block ([place])    a. 11/2010 s/p SJM 2210 [facility] DC PPM, ser# [id number].   Depression    Diabetes mellitus without complication ([place])    Fall 11-10-14   GERD (gastroesophageal reflux disease)    History of prostate cancer    Hyperlipidemia    Hypertension    LBBB (left bundle branch block)    Left-sided Bell's palsy    Lung cancer ([place]) 2016   NICM (nonischemic cardiomyopathy) ([place])    a. 07/2014 Echo: EF 35-40%, mid-apicalanteroseptal DK, Gr 1 DD, mild-mod dil LA.   Non-obstructive CAD    a. 07/2014 Abnl MV;  b. 08/2014 Cath: LM nl, LAD 30p, RI 40p, LCX nl, OM1 40, RCA dominant 30p,  70d-->Med Rx.   Poor balance    Presence of permanent cardiac pacemaker    Sleep apnea    a. cpap   Vertigo    WPW (Wolff-Parkinson-White syndrome)    a. S/P RFCA 1991.    Tobacco Use: Social History   Tobacco Use  Smoking Status Former   Packs/day: 1.00   Years: 56.00   Pack years: 56.00   Types: Cigarettes   Quit date: 2011   Years since quitting: 11.7  Smokeless Tobacco Never    Labs: Recent Review Scientist, physiological     Labs for ITP Cardiac and Pulmonary Rehab Latest Ref Rng & Units 09/07/2019 12/10/2019 05/08/2020 11/09/2020 11/13/2020   Cholestrol 100 - 199 mg/dL - 136 134 - 130   LDLCALC 0 - 99 mg/dL - 75 66 - 63   HDL >39 mg/dL - 42 52 - 43   Trlycerides 0 - 149 mg/dL - 93 83 - 137   Hemoglobin A1c 4.0 - 5.6 % 7.2(H) 7.5(H) 7.4(H) 7.7(A) -   HCO3 20.0 - 24.0 mEq/L - - - - -   TCO2 0 - 100 mmol/L - - - - -   O2SAT % - - - - -        Exercise Target Goals: Exercise Program Goal: Individual exercise prescription set using results from initial 6 min walk test and THRR while considering  patient's activity barriers and safety.   Exercise Prescription Goal: Initial exercise prescription builds to 30-45 minutes a day of aerobic activity, 2-3 days per week.  Home exercise guidelines will be given to patient during program as part of exercise prescription that the participant will acknowledge.   Education: Aerobic Exercise: - Group verbal and visual presentation on the components of exercise prescription. Introduces F.I.T.T principle from ACSM for exercise prescriptions.  Reviews F.I.T.T. principles of aerobic exercise including progression. Written material given at graduation.   Education: Resistance Exercise: - Group verbal and visual presentation on the components of exercise prescription. Introduces F.I.T.T principle from ACSM for exercise prescriptions  Reviews F.I.T.T. principles of resistance exercise including progression. Written material given at  graduation. Flowsheet Row Cardiac Rehab from 08/29/2021 in [facility] Cardiac and Pulmonary Rehab  Date 06/20/21  Educator [facility]  Instruction Review Code 1- Verbalizes Understanding        Education: Exercise & Equipment Safety: - Individual verbal instruction and demonstration of equipment use and safety with use of the equipment. Flowsheet Row Cardiac Rehab from 08/29/2021 in [facility] Cardiac and Pulmonary Rehab  Date 05/24/21  Educator AS  Instruction Review Code 1- Verbalizes Understanding       Education: Exercise Physiology & General Exercise Guidelines: - Group verbal and written instruction with models to  review the exercise physiology of the cardiovascular system and associated critical values. Provides general exercise guidelines with specific guidelines to those with heart or lung disease.    Education: Flexibility, Balance, Mind/Body Relaxation: - Group verbal and visual presentation with interactive activity on the components of exercise prescription. Introduces F.I.T.T principle from ACSM for exercise prescriptions. Reviews F.I.T.T. principles of flexibility and balance exercise training including progression. Also discusses the mind body connection.  Reviews various relaxation techniques to help reduce and manage stress (i.e. Deep breathing, progressive muscle relaxation, and visualization). Balance handout provided to take home. Written material given at graduation. Flowsheet Row Cardiac Rehab from 08/29/2021 in [facility] Cardiac and Pulmonary Rehab  Date 08/29/21  Educator [facility]  Instruction Review Code 1- Verbalizes Understanding       Activity Barriers & Risk Stratification:  Activity Barriers & Cardiac Risk Stratification - 05/14/21 1406       Activity Barriers & Cardiac Risk Stratification   Activity Barriers Back Problems;Joint Problems;Decreased Ventricular Function;Muscular Weakness    Cardiac Risk Stratification High             6 Minute Walk:  6 Minute Walk     Row  Name 05/24/21 1531 09/26/21 1147       6 Minute Walk   Phase -- Discharge    Distance 590 feet 600 feet    Distance Feet Change -- 10 ft    Walk Time 5 minutes 5 minutes    # of Rest Breaks 2 2    MPH 1.34 1.36    METS 1.1 1    RPE 17 13    Perceived Dyspnea  3 3    VO2 Peak 3.86 3.48    Symptoms Yes (comment) Yes (comment)    Comments leg pain/cramping due to vascular issue 5/10 SOB    Resting HR 90 bpm 86 bpm    Resting BP 126/62 132/56    Resting Oxygen Saturation  98 % --    Exercise Oxygen Saturation  during 6 min walk 96 % --    Max Ex. HR 118 bpm 119 bpm    Max Ex. BP 156/72 134/74    2 Minute Post BP 140/70 --             Oxygen Initial Assessment:   Oxygen Re-Evaluation:   Oxygen Discharge (Final Oxygen Re-Evaluation):   Initial Exercise Prescription:  Initial Exercise Prescription - 05/24/21 1500       Date of Initial Exercise RX and Referring Provider   Date 05/24/21    Referring Provider Gollan      NuStep   Level 1    SPM 80    Minutes 15    METs 1.1      Recumbant Elliptical   Level 1    RPM 50    Minutes 15    METs 1.1      REL-XR   Level 1    Speed 50    Minutes 15    METs 1.1      Track   Laps 10    Minutes 15      Prescription Details   Frequency (times per week) 3    Duration Progress to 30 minutes of continuous aerobic without signs/symptoms of physical distress      Intensity   THRR 40-80% of Max Heartrate 107-124    Ratings of Perceived Exertion 11-13    Perceived Dyspnea 0-4      Progression   Progression Continue to  progress workloads to maintain intensity without signs/symptoms of physical distress.      Resistance Training   Training Prescription Yes    Weight 3 lb    Reps 10-15             Perform Capillary Blood Glucose checks as needed.  Exercise Prescription Changes:   Exercise Prescription Changes     Row Name 05/24/21 1600 06/26/21 1500 07/12/21 0800 07/23/21 1000 07/24/21 0800      Response to Exercise   Blood Pressure (Admit) 126/62 134/74 122/62 -- 126/58   Blood Pressure (Exercise) 156/72 126/68 114/58 -- 122/60   Blood Pressure (Exit) 142/70 130/68 104/56 -- 112/54   Heart Rate (Admit) 90 bpm 89 bpm 78 bpm -- 94 bpm   Heart Rate (Exercise) 118 bpm 111 bpm 101 bpm -- 100 bpm   Heart Rate (Exit) 93 bpm 87 bpm 89 bpm -- 90 bpm   Oxygen Saturation (Admit) 98 % -- -- -- --   Oxygen Saturation (Exercise) 96 % -- -- -- --   Rating of Perceived Exertion (Exercise) 17 12 13  -- 13   Perceived Dyspnea (Exercise) 3 -- -- -- --   Symptoms 5/10 leg cramp/pain fatigue none -- fatigue in legs   Comments -- second full day of exercise -- -- --   Duration -- Progress to 30 minutes of  aerobic without signs/symptoms of physical distress Progress to 30 minutes of  aerobic without signs/symptoms of physical distress -- Progress to 30 minutes of  aerobic without signs/symptoms of physical distress   Intensity -- THRR unchanged THRR unchanged -- THRR unchanged     Progression   Progression -- Continue to progress workloads to maintain intensity without signs/symptoms of physical distress. Continue to progress workloads to maintain intensity without signs/symptoms of physical distress. -- Continue to progress workloads to maintain intensity without signs/symptoms of physical distress.   Average METs -- 1.97 1.96 -- 2.5     Resistance Training   Training Prescription -- Yes Yes -- Yes   Weight -- 3 lb 3 lb -- 3 lb   Reps -- 10-15 10-15 -- 10-15     Interval Training   Interval Training -- No No -- No     NuStep   Level -- 1 3 -- 4   Minutes -- 15 30 -- 15   METs -- 2.5 2 -- 2.5     T5 Nustep   Level -- -- 3 -- --   Minutes -- -- 15 -- --   METs -- -- 1.8 -- --     Track   Laps -- 8 10 -- 2   Minutes -- 15 15 -- 15   METs -- 1.44 1.54 -- --     Home Exercise Plan   Plans to continue exercise at -- -- -- Home (comment)  walking, Youtube staff videos Home (comment)   walking, [employer] staff videos   Frequency -- -- -- Add 2 additional days to program exercise sessions.  Start with 1 Add 2 additional days to program exercise sessions.  Start with 1   Initial Home Exercises Provided -- -- -- 07/23/21 07/23/21    Row Name 08/08/21 0700 08/20/21 0900 09/04/21 1400 09/17/21 1000       Response to Exercise   Blood Pressure (Admit) 122/58 118/60 128/62 124/56    Blood Pressure (Exercise) 132/82 -- -- --    Blood Pressure (Exit) 142/70 122/60 126/60 106/62    Heart Rate (Admit) 93  bpm 59 bpm 65 bpm 97 bpm    Heart Rate (Exercise) 119 bpm 120 bpm 78 bpm 106 bpm    Heart Rate (Exit) 112 bpm 112 bpm 74 bpm 80 bpm    Rating of Perceived Exertion (Exercise) 11 12 12 14     Duration Progress to 30 minutes of  aerobic without signs/symptoms of physical distress Progress to 30 minutes of  aerobic without signs/symptoms of physical distress Progress to 30 minutes of  aerobic without signs/symptoms of physical distress Progress to 30 minutes of  aerobic without signs/symptoms of physical distress    Intensity THRR unchanged THRR unchanged THRR unchanged THRR unchanged         Progression   Progression Continue to progress workloads to maintain intensity without signs/symptoms of physical distress. Continue to progress workloads to maintain intensity without signs/symptoms of physical distress. Continue to progress workloads to maintain intensity without signs/symptoms of physical distress. Continue to progress workloads to maintain intensity without signs/symptoms of physical distress.    Average METs 2.3 2.01 2.4 2.11         Resistance Training   Training Prescription Yes Yes Yes Yes    Weight 3 lb 3 lb 4 lb 4 lb    Reps 10-15 10-15 10-15 10-15         Interval Training   Interval Training No No No No         NuStep   Level 4 4 4 4     Minutes 15 30 30 30     METs 2.3 1.9 2.8 2         Biostep-RELP   Level -- 4 -- --    Minutes -- 15 -- --    METs -- 2 --  --         Track   Laps -- -- 3 10    Minutes -- -- -- 15    METs -- -- -- 1.54         Home Exercise Plan   Plans to continue exercise at Home (comment)  walking, Youtube staff videos Home (comment)  walking, [employer] staff videos Home (comment)  walking, Youtube staff videos Home (comment)  walking, Youtube staff videos    Frequency Add 2 additional days to program exercise sessions.  Start with 1 Add 2 additional days to program exercise sessions.  Start with 1 Add 2 additional days to program exercise sessions.  Start with 1 Add 2 additional days to program exercise sessions.  Start with 1    Initial Home Exercises Provided 07/23/21 07/23/21 07/23/21 07/23/21             Exercise Comments:   Exercise Comments     Row Name 06/20/21 1018           Exercise Comments First full day of exercise!  Patient was oriented to gym and equipment including functions, settings, policies, and procedures.  Patient's individual exercise prescription and treatment plan were reviewed.  All starting workloads were established based on the results of the 6 minute walk test done at initial orientation visit.  The plan for exercise progression was also introduced and progression will be customized based on patient's performance and goals.                Exercise Goals and Review:   Exercise Goals     Row Name 05/24/21 1602             Exercise Goals   Increase Physical Activity  Yes       Intervention Provide advice, education, support and counseling about physical activity/exercise needs.;Develop an individualized exercise prescription for aerobic and resistive training based on initial evaluation findings, risk stratification, comorbidities and participant's personal goals.       Expected Outcomes Short Term: Attend rehab on a regular basis to increase amount of physical activity.;Long Term: Add in home exercise to make exercise part of routine and to increase amount of physical  activity.;Long Term: Exercising regularly at least 3-5 days a week.       Increase Strength and Stamina Yes       Intervention Provide advice, education, support and counseling about physical activity/exercise needs.;Develop an individualized exercise prescription for aerobic and resistive training based on initial evaluation findings, risk stratification, comorbidities and participant's personal goals.       Expected Outcomes Short Term: Increase workloads from initial exercise prescription for resistance, speed, and METs.;Long Term: Improve cardiorespiratory fitness, muscular endurance and strength as measured by increased METs and functional capacity (6MWT)       Able to understand and use rate of perceived exertion (RPE) scale Yes       Intervention Provide education and explanation on how to use RPE scale       Expected Outcomes Short Term: Able to use RPE daily in rehab to express subjective intensity level;Long Term:  Able to use RPE to guide intensity level when exercising independently       Able to understand and use Dyspnea scale Yes       Intervention Provide education and explanation on how to use Dyspnea scale       Expected Outcomes Short Term: Able to use Dyspnea scale daily in rehab to express subjective sense of shortness of breath during exertion;Long Term: Able to use Dyspnea scale to guide intensity level when exercising independently       Knowledge and understanding of Target Heart Rate Range (THRR) Yes       Intervention Provide education and explanation of THRR including how the numbers were predicted and where they are located for reference       Expected Outcomes Short Term: Able to state/look up THRR;Short Term: Able to use daily as guideline for intensity in rehab;Long Term: Able to use THRR to govern intensity when exercising independently       Able to check pulse independently Yes       Intervention Provide education and demonstration on how to check pulse in carotid and  radial arteries.;Review the importance of being able to check your own pulse for safety during independent exercise       Expected Outcomes Short Term: Able to explain why pulse checking is important during independent exercise;Long Term: Able to check pulse independently and accurately       Understanding of Exercise Prescription Yes       Intervention Provide education, explanation, and written materials on patient's individual exercise prescription       Expected Outcomes Short Term: Able to explain program exercise prescription;Long Term: Able to explain home exercise prescription to exercise independently                Exercise Goals Re-Evaluation :  Exercise Goals Re-Evaluation     Row Name 06/20/21 1018 06/26/21 1529 07/12/21 0853 07/23/21 1030 07/24/21 0835     Exercise Goal Re-Evaluation   Exercise Goals Review Increase Physical Activity;Increase Strength and Stamina;Able to understand and use rate of perceived exertion (RPE) scale;Knowledge and understanding of  Target Heart Rate Range (THRR);Able to understand and use Dyspnea scale;Able to check pulse independently;Understanding of Exercise Prescription Increase Physical Activity;Increase Strength and Stamina;Understanding of Exercise Prescription Increase Physical Activity;Increase Strength and Stamina;Understanding of Exercise Prescription Increase Physical Activity;Increase Strength and Stamina;Understanding of Exercise Prescription Increase Physical Activity;Increase Strength and Stamina;Understanding of Exercise Prescription   Comments Reviewed RPE and dyspnea scales, THR and program prescription with pt today.  Pt voiced understanding and was given a copy of goals to take home. Howard [name] has completed his first two full days of exercise.  We will continue to encourage good attendance and monitor his progress. Howard [name] is doing well in rehab. He has already increased his level to 3 on the T4 Nustep and tolerated 10 laps on the track. Will  continue to monitor. Reviewed home exercise with pt today.  Pt plans to walk or use the Youtube videos for exercise. Start with slow progression and build up tolerance. Reviewed THR, pulse, RPE, sign and symptoms, pulse oximetery and when to call 911 or MD.  Also discussed weather considerations and indoor options.  Pt voiced understanding. Howard [name] is doing well in rehab.  He is is up to 2.5 METs on the NuStep.  He does try to walk each day and usually averages about two laps each day.  We will continue to montior his progress.   Expected Outcomes Short: Use RPE daily to regulate intensity. Long: Follow program prescription in THR. Short: Continue to attend rehab regularly Long: Continue to follow prescription Short: Continue to increase number of laps as tolerated Long: Continue to build up overall strength and stamina Short: Start with 1 day of exercise at home Long: Continue to exercise independently at home at appropriate prescription Short: Continue to improve on walking LOng: Continue to improve stamina    Row Name 08/08/21 0755 08/20/21 0908 09/04/21 1419 09/17/21 1006 09/24/21 1055     Exercise Goal Re-Evaluation   Exercise Goals Review Increase Physical Activity;Increase Strength and Stamina Increase Physical Activity;Increase Strength and Stamina Increase Physical Activity;Increase Strength and Stamina Increase Physical Activity;Increase Strength and Stamina Increase Physical Activity;Increase Strength and Stamina   Comments Howard [name] is up to level 4 on NS.  Staff will encourage him to continue adding laps walking. Howard [name] has not walked here at rehab as OT wants him to use his walker due to a fall risk. Patient has not brought his walker with him and refuses to walk. Patient has been continuing to do level 4 on BS and T4 Nustep and has not shown much improvement compared to when he first started. Staff will talk to patient to evaluate if patient is more appropriate for PT. Howard [name] got his walker and is working  on walking the track.  He has done up to 6 laps.  We will monitor progress. Howard [name] is back to walking again at [facility] and is now back up to 10 laps! Staff will encourage him to continue walking at rehab, while using his walker. Howard [name] has been able to walk better with a walker.  He did have a fall last week - called 911 and they checked him out ok so he didnt go to ER.   Expected Outcomes Short:  work up to 5 laps walking Long:  improve overall stamina Short: Discuss rehab vs. PT Long: Continue to increase overall strength and stamina Short: continue to work on walking  Long:  walk 10 laps Short: Increase tolerance on track as tolerateed Long: Continue to increase overall MET level --  Discharge Exercise Prescription (Final Exercise Prescription Changes):  Exercise Prescription Changes - 09/17/21 1000       Response to Exercise   Blood Pressure (Admit) 124/56    Blood Pressure (Exit) 106/62    Heart Rate (Admit) 97 bpm    Heart Rate (Exercise) 106 bpm    Heart Rate (Exit) 80 bpm    Rating of Perceived Exertion (Exercise) 14    Duration Progress to 30 minutes of  aerobic without signs/symptoms of physical distress    Intensity THRR unchanged      Progression   Progression Continue to progress workloads to maintain intensity without signs/symptoms of physical distress.    Average METs 2.11      Resistance Training   Training Prescription Yes    Weight 4 lb    Reps 10-15      Interval Training   Interval Training No      NuStep   Level 4    Minutes 30    METs 2      Track   Laps 10    Minutes 15    METs 1.54      Home Exercise Plan   Plans to continue exercise at Home (comment)   walking, Youtube staff videos   Frequency Add 2 additional days to program exercise sessions.   Start with 1   Initial Home Exercises Provided 07/23/21             Nutrition:  Target Goals: Understanding of nutrition guidelines, daily intake of sodium '1500mg'$ , cholesterol  '200mg'$ , calories 30% from fat and 7% or less from saturated fats, daily to have 5 or more servings of fruits and vegetables.  Education: All About Nutrition: -Group instruction provided by verbal, written material, interactive activities, discussions, models, and posters to present general guidelines for heart healthy nutrition including fat, fiber, MyPlate, the role of sodium in heart healthy nutrition, utilization of the nutrition label, and utilization of this knowledge for meal planning. Follow up email sent as well. Written material given at graduation. Flowsheet Row Cardiac Rehab from 08/29/2021 in [facility] Cardiac and Pulmonary Rehab  Date 07/04/21  Educator [facility]  Instruction Review Code 1- Verbalizes Understanding       Biometrics:  Pre Biometrics - 05/24/21 1603       Pre Biometrics   Height 5' 9.5" (1.765 m)    Weight 190 lb (86.2 kg)    BMI (Calculated) 27.67              Nutrition Therapy Plan and Nutrition Goals:  Nutrition Therapy & Goals - 08/15/21 1535       Nutrition Therapy   RD appointment deferred Yes   Eddy is still working towards weight gain with outpatient RD, his weakness is worsening at this time. Will continue to follow up.            Nutrition Assessments:  MEDIFICTS Score Key: ?70 Need to make dietary changes  40-70 Heart Healthy Diet ? 40 Therapeutic Level Cholesterol Diet  Flowsheet Row Cardiac Rehab from 05/24/2021 in [facility] Cardiac and Pulmonary Rehab  Picture Your Plate Total Score on Admission 56      Picture Your Plate Scores: <03 Unhealthy dietary pattern with much room for improvement. 41-50 Dietary pattern unlikely to meet recommendations for good health and room for improvement. 51-60 More healthful dietary pattern, with some room for improvement.  >60 Healthy dietary pattern, although there may be some specific behaviors that could be improved.  Nutrition Goals Re-Evaluation:  Nutrition Goals Re-Evaluation     [employer] Name  06/27/21 1007 07/23/21 1010 08/20/21 1006 09/24/21 1053       Goals   Current Weight 184 lb (83.5 kg) -- -- --    Nutrition Goal Meet with the Dietician. Meet with the [place]. ST/LT: continue to see outpatient RD --    Comment Khamarion has been going out a lot to eat since his wifes back hurts when she stands. He has set an appointment with the dietician. Iker is currently seeing a dietican outside of rehab. Since he had radiation, he hasn't had an appetite.He still needs to meet with the RD here to establish goals. Johntavius is continues to see a dietitian outside of rehab. Since he had radiation, he hasn't had an appetite, she provided him with medication to help with hunger. SHe has told him to increase fat to improve calorie intake. Will continue to follow up. Arend still sees the RD through cancer center.  He is on a medication to help with appetite.  He says appetite has been better lately    Expected Outcome Short: meet with the dietician. Long: maintain a diet that pertains to him. Short: Meet with RD Long: Continue to eat heart healthy diet ST/LT: continue to see outpatient RD ST/LT: continue to follow outpatient RD advice             Nutrition Goals Discharge (Final Nutrition Goals Re-Evaluation):  Nutrition Goals Re-Evaluation - 09/24/21 1053       Goals   Comment Howard [name] still sees the RD through cancer center.  He is on a medication to help with appetite.  He says appetite has been better lately    Expected Outcome ST/LT: continue to follow outpatient RD advice             Psychosocial: Target Goals: Acknowledge presence or absence of significant depression and/or stress, maximize coping skills, provide positive support system. Participant is able to verbalize types and ability to use techniques and skills needed for reducing stress and depression.   Education: Stress, Anxiety, and Depression - Group verbal and visual presentation to define topics covered.  Reviews how body is  impacted by stress, anxiety, and depression.  Also discusses healthy ways to reduce stress and to treat/manage anxiety and depression.  Written material given at graduation.   Education: Sleep Hygiene -Provides group verbal and written instruction about how sleep can affect your health.  Define sleep hygiene, discuss sleep cycles and impact of sleep habits. Review good sleep hygiene tips.    Initial Review & Psychosocial Screening:  Initial Psych Review & Screening - 05/14/21 1411       Initial Review   Current issues with Current Stress Concerns    Source of Stress Concerns Unable to participate in former interests or hobbies;Unable to perform yard/household activities;Chronic Illness      Family Dynamics   Good Support System? Yes   wife     Barriers   Psychosocial barriers to participate in program There are no identifiable barriers or psychosocial needs.;The patient should benefit from training in stress management and relaxation.      Screening Interventions   Interventions Encouraged to exercise;Provide feedback about the scores to participant;To provide support and resources with identified psychosocial needs;Program counselor consult    Expected Outcomes Short Term goal: Utilizing psychosocial counselor, staff and physician to assist with identification of specific Stressors or current issues interfering with healing process. Setting desired goal for each  stressor or current issue identified.;Long Term Goal: Stressors or current issues are controlled or eliminated.;Short Term goal: Identification and review with participant of any Quality of Life or Depression concerns found by scoring the questionnaire.;Long Term goal: The participant improves quality of Life and PHQ9 Scores as seen by post scores and/or verbalization of changes             Quality of Life Scores:   Quality of Life - 05/24/21 1611       Quality of Life   Select Quality of Life      Quality of Life  Scores   Health/Function Pre 23.9 %    Socioeconomic Pre 30 %    Psych/Spiritual Pre 28.29 %    Family Pre 29.5 %    GLOBAL Pre 26.79 %            Scores of 19 and below usually indicate a poorer quality of life in these areas.  A difference of  2-3 points is a clinically meaningful difference.  A difference of 2-3 points in the total score of the Quality of Life Index has been associated with significant improvement in overall quality of life, self-image, physical symptoms, and general health in studies assessing change in quality of life.  PHQ-9: Recent Review Flowsheet Data     Depression screen [facility] 2/9 05/24/2021 11/08/2020   Decreased Interest 0 0   Down, Depressed, Hopeless 0 0   PHQ - 2 Score 0 0   Altered sleeping 0 -   Tired, decreased energy 0 -   Change in appetite 0 -   Feeling bad or failure about yourself  0 -   Trouble concentrating 0 -   Moving slowly or fidgety/restless 0 -   Suicidal thoughts 0 -   PHQ-9 Score 0 -      Interpretation of Total Score  Total Score Depression Severity:  1-4 = Minimal depression, 5-9 = Mild depression, 10-14 = Moderate depression, 15-19 = Moderately severe depression, 20-27 = Severe depression   Psychosocial Evaluation and Intervention:  Psychosocial Evaluation - 05/14/21 1416       Psychosocial Evaluation & Interventions   Interventions Encouraged to exercise with the program and follow exercise prescription;Stress management education    Comments Howard [name] reports doing well. His hearing is a big stressor for him, so he relies on his wife's help in this area as well as managing his health care. He states he is sleeping well now that they have gotten his medication adjusted. He did have some back issues that have caused walking difficulties at times and increased his weakness which has led him to not doing whatever he wants, so he is looking forward to working on his balance and his stamina. He is also starting radiation medication so  he is a little hesitant as to how he will feel during treatment.    Expected Outcomes Short; attend Cardiac Rehab for education and Exercise. Long: develop and maintain positive self care habits.    Continue Psychosocial Services  Follow up required by staff             Psychosocial Re-Evaluation:  Psychosocial Re-Evaluation     [place] Name 06/27/21 1006 07/23/21 1009 08/20/21 1007 09/24/21 1052       Psychosocial Re-Evaluation   Current issues with None Identified Current Stress Concerns None Identified Current Stress Concerns    Comments Patient reports no issues with their current mental states, sleep, stress, depression or anxiety. Will follow up  with patient in a few weeks for any changes. Howard [name] has really good support from his wife of 63 years! He just finished radiation about a month ago and is experiencing some side effects from that, such as fatigue and loss of appetite. Other than that, he denies any other concerns or stresses. He is not on any medication for depression or anxiety. He is seeing his cardiologist this week to get results from his testing and is anxious to hear back. Howard [name] has really good support from his wife of 43 years and reports no stressors at this time, but has reported some regarding not being able to do what he used to physically in the past. He just finished radiation about a month ago and is experiencing some side effects from that, such as fatigue and loss of appetite.  He is not on any medication for depression or anxiety. He likes to watch the history channel. Fed reports his stress is decreased since he completed radiation and they got all of cancer.  He has follow up in 3 months.    Expected Outcomes Short: Continue to exercise regularly to support mental health and notify staff of any changes. Long: maintain mental health and well being through teaching of rehab or prescribed medications independently. Short: Continue attendance with rehab to maintain energy  levels Long: Continue to utilize exercise for stress management and maintain positive attitude Short: Continue attendance with rehab to maintain energy levels Long: Continue to utilize exercise for stress management and maintain positive attitude Short: continue to atend reahb to build stamina Long:  maintain positive outlook    Interventions Encouraged to attend Cardiac Rehabilitation for the exercise Encouraged to attend Cardiac Rehabilitation for the exercise Encouraged to attend Cardiac Rehabilitation for the exercise --    Continue Psychosocial Services  Follow up required by staff Follow up required by staff Follow up required by staff --         Initial Review   Source of Stress Concerns -- -- Unable to participate in former interests or hobbies;Unable to perform yard/household activities;Chronic Illness --             Psychosocial Discharge (Final Psychosocial Re-Evaluation):  Psychosocial Re-Evaluation - 09/24/21 1052       Psychosocial Re-Evaluation   Current issues with Current Stress Concerns    Comments Howard [name] reports his stress is decreased since he completed radiation and they got all of cancer.  He has follow up in 3 months.    Expected Outcomes Short: continue to atend reahb to build stamina Long:  maintain positive outlook             Vocational Rehabilitation: Provide vocational rehab assistance to qualifying candidates.   Vocational Rehab Evaluation & Intervention:  Vocational Rehab - 05/14/21 1411       Initial Vocational Rehab Evaluation & Intervention   Assessment shows need for Vocational Rehabilitation No             Education: Education Goals: Education classes will be provided on a variety of topics geared toward better understanding of heart health and risk factor modification. Participant will state understanding/return demonstration of topics presented as noted by education test scores.  Learning Barriers/Preferences:  Learning  Barriers/Preferences - 05/14/21 1406       Learning Barriers/Preferences   Learning Barriers Hearing    Learning Preferences Individual Instruction             General Cardiac Education Topics:  AED/CPR: - Group verbal and  written instruction with the use of models to demonstrate the basic use of the AED with the basic ABC's of resuscitation.   Anatomy and Cardiac Procedures: - Group verbal and visual presentation and models provide information about basic cardiac anatomy and function. Reviews the testing methods done to diagnose heart disease and the outcomes of the test results. Describes the treatment choices: Medical Management, Angioplasty, or Coronary Bypass Surgery for treating various heart conditions including Myocardial Infarction, Angina, Valve Disease, and Cardiac Arrhythmias.  Written material given at graduation. Flowsheet Row Cardiac Rehab from 08/29/2021 in [facility] Cardiac and Pulmonary Rehab  Date 06/20/21  Educator SB  Instruction Review Code 1- Verbalizes Understanding       Medication Safety: - Group verbal and visual instruction to review commonly prescribed medications for heart and lung disease. Reviews the medication, class of the drug, and side effects. Includes the steps to properly store meds and maintain the prescription regimen.  Written material given at graduation. Flowsheet Row Cardiac Rehab from 08/29/2021 in [facility] Cardiac and Pulmonary Rehab  Date 07/11/21  Educator SB  Instruction Review Code 1- Verbalizes Understanding       Intimacy: - Group verbal instruction through game format to discuss how heart and lung disease can affect sexual intimacy. Written material given at graduation..   Know Your Numbers and Heart Failure: - Group verbal and visual instruction to discuss disease risk factors for cardiac and pulmonary disease and treatment options.  Reviews associated critical values for Overweight/Obesity, Hypertension, Cholesterol, and Diabetes.   Discusses basics of heart failure: signs/symptoms and treatments.  Introduces Heart Failure Zone chart for action plan for heart failure.  Written material given at graduation.   Infection Prevention: - Provides verbal and written material to individual with discussion of infection control including proper hand washing and proper equipment cleaning during exercise session. Flowsheet Row Cardiac Rehab from 08/29/2021 in [facility] Cardiac and Pulmonary Rehab  Date 05/24/21  Educator AS  Instruction Review Code 1- Verbalizes Understanding       Falls Prevention: - Provides verbal and written material to individual with discussion of falls prevention and safety. Flowsheet Row Cardiac Rehab from 08/29/2021 in [facility] Cardiac and Pulmonary Rehab  Date 05/24/21  Educator AS  Instruction Review Code 1- Verbalizes Understanding       Other: -Provides group and verbal instruction on various topics (see comments)   Knowledge Questionnaire Score:   Core Components/Risk Factors/Patient Goals at Admission:  Personal Goals and Risk Factors at Admission - 05/24/21 1604       Core Components/Risk Factors/Patient Goals on Admission   Diabetes Yes    Intervention Provide education about signs/symptoms and action to take for hypo/hyperglycemia.;Provide education about proper nutrition, including hydration, and aerobic/resistive exercise prescription along with prescribed medications to achieve blood glucose in normal ranges: Fasting glucose 65-99 mg/dL    Expected Outcomes Short Term: Participant verbalizes understanding of the signs/symptoms and immediate care of hyper/hypoglycemia, proper foot care and importance of medication, aerobic/resistive exercise and nutrition plan for blood glucose control.;Long Term: Attainment of HbA1C < 7%.    Heart Failure Yes    Intervention Provide a combined exercise and nutrition program that is supplemented with education, support and counseling about heart failure.  Directed toward relieving symptoms such as shortness of breath, decreased exercise tolerance, and extremity edema.    Expected Outcomes Improve functional capacity of life;Short term: Attendance in program 2-3 days a week with increased exercise capacity. Reported lower sodium intake. Reported increased fruit and vegetable  intake. Reports medication compliance.;Short term: Daily weights obtained and reported for increase. Utilizing diuretic protocols set by physician.;Long term: Adoption of self-care skills and reduction of barriers for Howard [name] signs and symptoms recognition and intervention leading to self-care maintenance.    Hypertension Yes    Intervention Provide education on lifestyle modifcations including regular physical activity/exercise, weight management, moderate sodium restriction and increased consumption of fresh fruit, vegetables, and low fat dairy, alcohol moderation, and smoking cessation.;Monitor prescription use compliance.    Expected Outcomes Short Term: Continued assessment and intervention until BP is < 140/52mm HG in hypertensive participants. < 130/62mm HG in hypertensive participants with diabetes, heart failure or chronic kidney disease.;Long Term: Maintenance of blood pressure at goal levels.             Education:Diabetes - Individual verbal and written instruction to review signs/symptoms of diabetes, desired ranges of glucose level fasting, after meals and with exercise. Acknowledge that pre and post exercise glucose checks will be done for 3 sessions at entry of program. [place] from 05/14/2021 in [facility] Cardiac and Pulmonary Rehab  Date 05/14/21  Educator [facility]  Instruction Review Code 1- Verbalizes Understanding       Core Components/Risk Factors/Patient Goals Review:   Goals and Risk Factor Review     Row Name 06/27/21 1003 07/23/21 1004 08/20/21 1002 09/24/21 1048       Core Components/Risk Factors/Patient Goals Review   Personal Goals  Review Diabetes;Weight Management/Obesity Diabetes;Weight Management/Obesity;Heart Failure Diabetes;Heart Failure Diabetes;Heart Failure    Review Howard [name] has been checking his sugar at home and has been in the 130s. He has had most of his blood sugar checks at [facility] and they have been within normal ranges. He would like to lose a little bit of weight in the program. He would like to reach a weight goal of 170 pounds. Howard [name] has not been checking his sugars at home and says he will start getting in the habit again. He discussed the importance of checking them and I encouraged him to ask his doctor to clarify how often he should be checking them. He has been losing weight and seeing a dietician to help him gain weight. He has a scale at home and weighs himself. He knows to look out for sudden weight gain in a short period of time with heart failure. Wife is good support with maintaining his medications.  He has been feeling a little winded, but has an echo tomorrow to assess, and talking to his doctor this week for results. Howard [name] has a walker at home, but has been getting weak. He finished chemo last month. He continues to see outpatient RD regarding his weight - gave him pills to encourage hunger. His weight has been stable for the time being. He will sometimes check his BG at home - not consistent (BG: he does not recall) - he knows he should check it more often. He continues to weigh himself at home. He is taking all of his medications as directed with the help of his wife. He reports no issues with medications. His doctor has talked to him about potentially needing supplemental O2. Howard [name] reports  taking all medication as directed with the help of his wife.  She also helps make sure he eats enough.  he is on a medication to help with appetite.  He is able to do more now using a walker instead of a cane.  he had a fall last week when he wasnt using his  walker - wife called 911 and vitals were ok so he did not go to  ER.  BG has been good according to Howard [name] when he checks at home.  He finished radiation last week and they said all cancer is gone and he has a follow up in 3 months.    Expected Outcomes Short: lose some weight. Long: reach a weight goal of 170 pounds. Short: Talk to doctor Long: Continue to manage lifestyle risk factors Short: check BG regularly Long: Continue to manage lifestyle risk factors Short:  continue to take meds as directed and use walker Long:manage risk factors long term             Core Components/Risk Factors/Patient Goals at Discharge (Final Review):   Goals and Risk Factor Review - 09/24/21 1048       Core Components/Risk Factors/Patient Goals Review   Personal Goals Review Diabetes;Heart Failure    Review Rommie reports  taking all medication as directed with the help of his wife.  She also helps make sure he eats enough.  he is on a medication to help with appetite.  He is able to do more now using a walker instead of a cane.  he had a fall last week when he wasnt using his walker - wife called 911 and vitals were ok so he did not go to ER.  BG has been good according to [facility] when he checks at home.  He finished radiation last week and they said all cancer is gone and he has a follow up in 3 months.    Expected Outcomes Short:  continue to take meds as directed and use walker Long:manage risk factors long term             ITP Comments:  ITP Comments     Row Name 05/14/21 1404 05/24/21 1617 06/06/21 0750 06/20/21 1017 07/04/21 1200   ITP Comments Initial telephone orientation completed. Diagnosis can be found in [facility] 5/2. EP orientation scheduled for Thursday 6/2 at 2pm. Completed 6MWT and gym orientation. Initial ITP created and sent for review to Dr. Emily Filbert, Medical Director. 30 Day review completed. Medical Director ITP review done, changes made as directed, and signed approval by Medical Director.  new to program First full day of exercise!  Patient was oriented to  gym and equipment including functions, settings, policies, and procedures.  Patient's individual exercise prescription and treatment plan were reviewed.  All starting workloads were established based on the results of the 6 minute walk test done at initial orientation visit.  The plan for exercise progression was also introduced and progression will be customized based on patient's performance and goals. 30 Day review completed. Medical Director ITP review done, changes made as directed, and signed approval by Medical Director.    [place] Name 08/01/21 0825 08/02/21 0851 08/29/21 0758 09/26/21 1427     ITP Comments 30 Day review completed. Medical Director ITP review done, changes made as directed, and signed approval by Medical Director. Per Gwenette Greet, OT, patient was evaluated by OT who stated medium fall risk. Patient should start using walker when at rehab per OT. Getting evaluated for possible referral for PT. Need to discuss with patient on his decision. 30 Day review completed. Medical Director ITP review done, changes made as directed, and signed approval by Medical Director. 30 day review completed. ITP sent to Dr. Emily Filbert, Medical Director of Cardiac Rehab. Continue with ITP unless changes are made by physician.  Comments: 30 day review

## 2021-09-26 NOTE — Progress Notes (Signed)
Daily Session Note  Patient Details  Name: Howard [name] MRN: [id number] Date of Birth: 11/24/1931 Referring Provider:   Flowsheet Row Cardiac Rehab from 05/24/2021 in [facility] Cardiac and Pulmonary Rehab  Referring Provider Gollan       Encounter Date: 09/26/2021  Check In:  Session Check In - 09/26/21 9847       Check-In   Supervising physician immediately available to respond to emergencies See telemetry face sheet for immediately available ER MD    Location [facility]-Cardiac & Pulmonary Rehab    Staff Present Birdie Sons, MPA, RN;Melissa Elizabeth, RDN, Rowe Pavy, BA, ACSM CEP, Exercise Physiologist    Virtual Visit No    Medication changes reported     No    Fall or balance concerns reported    No    Tobacco Cessation No Change    Warm-up and Cool-down Performed on first and last piece of equipment    Resistance Training Performed Yes    VAD Patient? No    PAD/SET Patient? No      Pain Assessment   Currently in Pain? No/denies                Social History   Tobacco Use  Smoking Status Former   Packs/day: 1.00   Years: 56.00   Pack years: 56.00   Types: Cigarettes   Quit date: 2011   Years since quitting: 11.7  Smokeless Tobacco Never    Goals Met:  Independence with exercise equipment Exercise tolerated well No report of concerns or symptoms today Strength training completed today  Goals Unmet:  Not Applicable  Comments: Pt able to follow exercise prescription today without complaint.  Will continue to monitor for progression.    Dr. Emily Filbert is Medical Director for [facility].  Dr. Ottie Glazier is Medical Director for [facility] Pulmonary Rehabilitation.

## 2021-09-27 ENCOUNTER — Encounter: Payer: Medicare Other | Admitting: Physical Therapy

## 2021-10-01 ENCOUNTER — Other Ambulatory Visit: Payer: Self-pay

## 2021-10-01 DIAGNOSIS — Z87891 Personal history of nicotine dependence: Secondary | ICD-10-CM | POA: Diagnosis not present

## 2021-10-01 DIAGNOSIS — I5042 Chronic combined systolic (congestive) and diastolic (congestive) heart failure: Secondary | ICD-10-CM

## 2021-10-01 NOTE — Progress Notes (Signed)
Daily Session Note  Patient Details  Name: Howard [name] MRN: [id number] Date of Birth: 02/19/1931 Referring Provider:   Flowsheet Row Cardiac Rehab from 05/24/2021 in [facility] Cardiac and Pulmonary Rehab  Referring Provider Gollan       Encounter Date: 10/01/2021  Check In:  Session Check In - 10/01/21 [place]       Check-In   Supervising physician immediately available to respond to emergencies See telemetry face sheet for immediately available ER MD    Location [facility]-Cardiac & Pulmonary Rehab    Staff Present Nada Maclachlan, BA, ACSM CEP, Exercise Physiologist;Kelly Amedeo Plenty, BS, ACSM CEP, Exercise Physiologist;Staria Birkhead, RN,BC,MSN    Virtual Visit No    Medication changes reported     No    Fall or balance concerns reported    No    Tobacco Cessation No Change    Warm-up and Cool-down Performed on first and last piece of equipment    Resistance Training Performed Yes    VAD Patient? No    PAD/SET Patient? No      Pain Assessment   Currently in Pain? No/denies                Social History   Tobacco Use  Smoking Status Former   Packs/day: 1.00   Years: 56.00   Pack years: 56.00   Types: Cigarettes   Quit date: 2011   Years since quitting: 11.7  Smokeless Tobacco Never    Goals Met:  Independence with exercise equipment Exercise tolerated well No report of concerns or symptoms today Strength training completed today  Goals Unmet:  Not Applicable  Comments: Pt able to follow exercise prescription today without complaint.  Will continue to monitor for progression.    Dr. Emily Filbert is Medical Director for [place].  Dr. Ottie Glazier is Medical Director for [facility] Pulmonary Rehabilitation.

## 2021-10-02 ENCOUNTER — Encounter: Payer: Medicare Other | Admitting: Physical Therapy

## 2021-10-02 ENCOUNTER — Telehealth: Payer: Self-pay | Admitting: Internal Medicine

## 2021-10-02 MED ORDER — SPIRONOLACTONE 25 MG PO TABS: 25.0000 mg | ORAL_TABLET | Freq: Every day | ORAL | 3 refills | Status: DC

## 2021-10-02 NOTE — Telephone Encounter (Signed)
Attempted to call pt's wife [name] back, [place] approved. No answer. Lmtcb.   Forwarding also to Dr. [name] and his nurse regarding alternative in the meantime.

## 2021-10-02 NOTE — Telephone Encounter (Signed)
Patient's wife called and states that his Spironolactone is not approved. Please call to discuss.

## 2021-10-02 NOTE — Telephone Encounter (Signed)
Spoke with wife.  Pt needs new Rx authorization sent to mail order.  She received letter in mail stating medication has not been approved.  Rx sent in May 2022 for year supply.  Resent Rx to [facility].  Pt wife appreciative and has no further questions/needs at this time.

## 2021-10-02 NOTE — Telephone Encounter (Signed)
Wife returning call

## 2021-10-03 ENCOUNTER — Other Ambulatory Visit: Payer: Self-pay

## 2021-10-03 DIAGNOSIS — I5042 Chronic combined systolic (congestive) and diastolic (congestive) heart failure: Secondary | ICD-10-CM

## 2021-10-03 DIAGNOSIS — Z87891 Personal history of nicotine dependence: Secondary | ICD-10-CM | POA: Diagnosis not present

## 2021-10-03 NOTE — Progress Notes (Signed)
Daily Session Note  Patient Details  Name: Howard [name] MRN: [id number] Date of Birth: [birth year] Referring Provider:   Flowsheet Row Cardiac Rehab from 05/24/2021 in [facility] Cardiac and Pulmonary Rehab  Referring Provider Gollan       Encounter Date: 10/03/2021  Check In:  Session Check In - 10/03/21 1015       Check-In   Supervising physician immediately available to respond to emergencies See telemetry face sheet for immediately available ER MD    Location [facility]-Cardiac & Pulmonary Rehab    Staff Present Birdie Sons, MPA, Elveria Rising, BA, ACSM CEP, Exercise Physiologist;Joseph Tessie Fass, [place]    Virtual Visit No    Medication changes reported     No    Fall or balance concerns reported    No    Tobacco Cessation No Change    Warm-up and Cool-down Performed on first and last piece of equipment    Resistance Training Performed Yes    VAD Patient? No    PAD/SET Patient? No      Pain Assessment   Currently in Pain? No/denies                Social History   Tobacco Use  Smoking Status Former   Packs/day: 1.00   Years: 56.00   Pack years: 56.00   Types: Cigarettes   Quit date: 2011   Years since quitting: 11.7  Smokeless Tobacco Never    Goals Met:  Independence with exercise equipment Exercise tolerated well No report of concerns or symptoms today Strength training completed today  Goals Unmet:  Not Applicable  Comments: Pt able to follow exercise prescription today without complaint.  Will continue to monitor for progression.    Dr. Emily Filbert is Medical Director for [place].  Dr. Ottie Glazier is Medical Director for [facility] Pulmonary Rehabilitation.

## 2021-10-04 ENCOUNTER — Encounter: Payer: Medicare Other | Admitting: Physical Therapy

## 2021-10-04 NOTE — Patient Instructions (Signed)
Discharge Patient Instructions  Patient Details  Name: Howard [name] MRN: [id number] Date of Birth: May 02, 1931 Referring Provider:  [name], MD   Number of Visits: 64  Reason for Discharge:  Patient reached a stable level of exercise. Patient independent in their exercise. Patient has met program and personal goals.   Diagnosis:  Heart failure, systolic and diastolic, chronic (HCC)  Initial Exercise Prescription:  Initial Exercise Prescription - 05/24/21 1500       Date of Initial Exercise RX and Referring Provider   Date 05/24/21    Referring Provider Gollan      NuStep   Level 1    SPM 80    Minutes 15    METs 1.1      Recumbant Elliptical   Level 1    RPM 50    Minutes 15    METs 1.1      REL-XR   Level 1    Speed 50    Minutes 15    METs 1.1      Track   Laps 10    Minutes 15      Prescription Details   Frequency (times per week) 3    Duration Progress to 30 minutes of continuous aerobic without signs/symptoms of physical distress      Intensity   THRR 40-80% of Max Heartrate 107-124    Ratings of Perceived Exertion 11-13    Perceived Dyspnea 0-4      Progression   Progression Continue to progress workloads to maintain intensity without signs/symptoms of physical distress.      Resistance Training   Training Prescription Yes    Weight 3 lb    Reps 10-15             Discharge Exercise Prescription (Final Exercise Prescription Changes):  Exercise Prescription Changes - 10/02/21 1400       Response to Exercise   Blood Pressure (Admit) 134/66    Blood Pressure (Exit) 130/62    Heart Rate (Admit) 85 bpm    Heart Rate (Exercise) 94 bpm    Heart Rate (Exit) 87 bpm    Rating of Perceived Exertion (Exercise) 12    Duration Progress to 30 minutes of  aerobic without signs/symptoms of physical distress    Intensity THRR unchanged      Progression   Progression Continue to progress workloads to maintain intensity without  signs/symptoms of physical distress.      Resistance Training   Training Prescription Yes    Weight 4 lb    Reps 10-15      Interval Training   Interval Training No      Home Exercise Plan   Plans to continue exercise at Home (comment)   walking, Youtube staff videos   Frequency Add 2 additional days to program exercise sessions.   Start with 1   Initial Home Exercises Provided 07/23/21             Functional Capacity:  6 Minute Walk     Row Name 05/24/21 1531 09/26/21 1147       6 Minute Walk   Phase -- Discharge    Distance 590 feet 600 feet    Distance Feet Change -- 10 ft    Walk Time 5 minutes 5 minutes    # of Rest Breaks 2 2    MPH 1.34 1.36    METS 1.1 1    RPE 17 13    Perceived Dyspnea  3 3    VO2 Peak 3.86 3.48    Symptoms Yes (comment) Yes (comment)    Comments leg pain/cramping due to vascular issue 5/10 SOB    Resting HR 90 bpm 86 bpm    Resting BP 126/62 132/56    Resting Oxygen Saturation  98 % --    Exercise Oxygen Saturation  during 6 min walk 96 % --    Max Ex. HR 118 bpm 119 bpm    Max Ex. BP 156/72 134/74    2 Minute Post BP 140/70 --             Nutrition & Weight - Outcomes:  Pre Biometrics - 05/24/21 1603       Pre Biometrics   Height 5' 9.5" (1.765 m)    Weight 190 lb (86.2 kg)    BMI (Calculated) 27.67              Nutrition:  Nutrition Therapy & Goals - 08/15/21 1535       Nutrition Therapy   RD appointment deferred Yes   [name] is still working towards weight gain with outpatient RD, his weakness is worsening at this time. Will continue to follow up.             Goals reviewed with patient; copy given to patient.

## 2021-10-05 ENCOUNTER — Ambulatory Visit (INDEPENDENT_AMBULATORY_CARE_PROVIDER_SITE_OTHER): Payer: Medicare Other

## 2021-10-05 ENCOUNTER — Other Ambulatory Visit: Payer: Self-pay

## 2021-10-05 DIAGNOSIS — I428 Other cardiomyopathies: Secondary | ICD-10-CM

## 2021-10-05 DIAGNOSIS — I5042 Chronic combined systolic (congestive) and diastolic (congestive) heart failure: Secondary | ICD-10-CM | POA: Diagnosis not present

## 2021-10-05 DIAGNOSIS — Z87891 Personal history of nicotine dependence: Secondary | ICD-10-CM | POA: Diagnosis not present

## 2021-10-05 NOTE — Progress Notes (Signed)
Daily Session Note  Patient Details  Name: Howard [name] MRN: [id number] Date of Birth: 10-Feb-1931 Referring Provider:   Flowsheet Row Cardiac Rehab from 05/24/2021 in [facility] Cardiac and Pulmonary Rehab  Referring Provider Gollan       Encounter Date: 10/05/2021  Check In:  Session Check In - 10/05/21 0941       Check-In   Supervising physician immediately available to respond to emergencies See telemetry face sheet for immediately available ER MD    Location [facility]-Cardiac & Pulmonary Rehab    Staff Present Birdie Sons, MPA, RN;Jessica [place], MA, RCEP, CCRP, CCET;Joseph [place], [place]    Virtual Visit No    Medication changes reported     No    Fall or balance concerns reported    No    Tobacco Cessation No Change    Warm-up and Cool-down Performed on first and last piece of equipment    Resistance Training Performed Yes    VAD Patient? No    PAD/SET Patient? No      Pain Assessment   Currently in Pain? No/denies                Social History   Tobacco Use  Smoking Status Former   Packs/day: 1.00   Years: 56.00   Pack years: 56.00   Types: Cigarettes   Quit date: 2011   Years since quitting: 11.7  Smokeless Tobacco Never    Goals Met:  Independence with exercise equipment Exercise tolerated well No report of concerns or symptoms today Strength training completed today  Goals Unmet:  Not Applicable  Comments:  Daxten graduated today from  rehab with 35 sessions completed.  Details of the patient's exercise prescription and what He needs to do in order to continue the prescription and progress were discussed with patient.  Patient was given a copy of prescription and goals.  Patient verbalized understanding.  Lebert plans to continue to exercise by going to the [facility].    Dr. Emily Filbert is Medical Director for [place].  Dr. Ottie Glazier is Medical Director for [facility] Pulmonary Rehabilitation.

## 2021-10-05 NOTE — Progress Notes (Signed)
Cardiac Individual Treatment Plan  Patient Details  Name: Howard [name] MRN: [id number] Date of Birth: 1931/03/08 Referring Provider:   Flowsheet Row Cardiac Rehab from 05/24/2021 in [facility] Cardiac and Pulmonary Rehab  Referring Provider Gollan       Initial Encounter Date:  Flowsheet Row Cardiac Rehab from 05/24/2021 in [facility] Cardiac and Pulmonary Rehab  Date 05/24/21       Visit Diagnosis: Heart failure, systolic and diastolic, chronic ([place])  Patient's Home Medications on Admission:  Current Outpatient Medications:    acetaminophen (TYLENOL) 500 MG tablet, Take 1,000 mg by mouth every 6 (six) hours as needed for moderate pain., Disp: , Rfl:    albuterol (VENTOLIN HFA) 108 (90 Base) MCG/ACT inhaler, INHALE 2 PUFFS INTO LUNGS EVERY 6 HOURS AS NEEDED FOR WHEEZING OR SHORTNESS OF BREATH, Disp: 8.5 g, Rfl: 6   aspirin EC 81 MG tablet, Take 81 mg by mouth daily., Disp: , Rfl:    cetirizine (ZYRTEC) 10 MG tablet, Take 10 mg by mouth daily., Disp: , Rfl:    Cholecalciferol (VITAMIN D3) 1000 UNITS CAPS, Take 1,000 Units by mouth daily., Disp: , Rfl:    donepezil (ARICEPT) 5 MG tablet, Take 5 mg by mouth daily. (Patient not taking: Reported on 09/18/2021), Disp: , Rfl:    ELIQUIS 5 MG TABS tablet, TAKE 1 TABLET TWICE A DAY  (SWITCHED FROM PLAVIX), Disp: 60 tablet, Rfl: 11   ezetimibe (ZETIA) 10 MG tablet, TAKE 1 TABLET DAILY, Disp: 90 tablet, Rfl: 0   Fluticasone-Umeclidin-Vilant (TRELEGY ELLIPTA) 100-62.5-25 MCG/INH AEPB, Inhale 1 puff into the lungs daily., Disp: 180 each, Rfl: 3   furosemide (LASIX) 20 MG tablet, Take 1 tablet (20 mg total) by mouth daily., Disp: 30 tablet, Rfl: 5   Iron-Vitamin C (VITRON-C) 65-125 MG TABS, Take 1 tablet by mouth daily., Disp: 30 tablet, Rfl: 2   isosorbide mononitrate (IMDUR) 30 MG 24 hr tablet, TAKE 1 TABLET DAILY, Disp: 90 tablet, Rfl: 2   megestrol (MEGACE) 40 MG tablet, Take 1 tablet (40 mg total) by mouth 2 (two) times daily., Disp: 60 tablet, Rfl: 2    metFORMIN (GLUCOPHAGE) 500 MG tablet, TAKE 1 TABLET TWICE DAILY  WITH MEALS, Disp: 60 tablet, Rfl: 0   metoprolol succinate (TOPROL-XL) 25 MG 24 hr tablet, TAKE 1 TABLET AT BEDTIME, Disp: 90 tablet, Rfl: 0   montelukast (SINGULAIR) 10 MG tablet, Take 10 mg by mouth daily as needed (sneezing/allergies.)., Disp: , Rfl:    Multiple Vitamin (MULTIVITAMIN WITH MINERALS) TABS tablet, Take 1 tablet by mouth daily. One-A-Day Multivitamin, Disp: , Rfl:    omeprazole (PRILOSEC) 40 MG capsule, TAKE 1 CAPSULE TWICE DAILY AS NEEDED, Disp: 180 capsule, Rfl: 3   promethazine-dextromethorphan (PROMETHAZINE-DM) 6.25-15 MG/5ML syrup, Take 5 mLs by mouth 3 (three) times daily as needed for cough. (Patient not taking: No sig reported), Disp: 118 mL, Rfl: 0   sacubitril-valsartan (ENTRESTO) 49-51 MG, Take 1 tablet by mouth 2 (two) times daily., Disp: 180 tablet, Rfl: 3   simvastatin (ZOCOR) 40 MG tablet, TAKE 1 TABLET AT BEDTIME, Disp: 90 tablet, Rfl: 3   spironolactone (ALDACTONE) 25 MG tablet, Take 1 tablet (25 mg total) by mouth daily., Disp: 90 tablet, Rfl: 3  Past Medical History: Past Medical History:  Diagnosis Date   Arthritis    Bell palsy    Bell's palsy 04/12/2015   Cancer (HCC)    prostate and skin   Chronic combined systolic and diastolic CHF, NYHA class 1 ([place])  a. 07/2014 Echo: EF 35-40%, Gr 1 DD.   Complete heart block ([place])    a. 11/2010 s/p SJM 2210 Accent DC PPM, ser# [id number].   Depression    Diabetes mellitus without complication ([place])    Fall 11-10-14   GERD (gastroesophageal reflux disease)    History of prostate cancer    Hyperlipidemia    Hypertension    LBBB (left bundle branch block)    Left-sided Bell's palsy    Lung cancer ([place]) 2016   NICM (nonischemic cardiomyopathy) ([place])    a. 07/2014 Echo: EF 35-40%, mid-apicalanteroseptal DK, Gr 1 DD, mild-mod dil LA.   Non-obstructive CAD    a. 07/2014 Abnl MV;  b. 08/2014 Cath: LM nl, LAD 30p, RI 40p, LCX nl, OM1 40, RCA dominant 30p,  70d-->Med Rx.   Poor balance    Presence of permanent cardiac pacemaker    Sleep apnea    a. cpap   Vertigo    WPW (Wolff-Parkinson-White syndrome)    a. S/P RFCA 1991.    Tobacco Use: Social History   Tobacco Use  Smoking Status Former   Packs/day: 1.00   Years: 56.00   Pack years: 56.00   Types: Cigarettes   Quit date: 2011   Years since quitting: 11.7  Smokeless Tobacco Never    Labs: Recent Review Scientist, physiological     Labs for ITP Cardiac and Pulmonary Rehab Latest Ref Rng & Units 09/07/2019 12/10/2019 05/08/2020 11/09/2020 11/13/2020   Cholestrol 100 - 199 mg/dL - 136 134 - 130   LDLCALC 0 - 99 mg/dL - 75 66 - 63   HDL >39 mg/dL - 42 52 - 43   Trlycerides 0 - 149 mg/dL - 93 83 - 137   Hemoglobin A1c 4.0 - 5.6 % 7.2(H) 7.5(H) 7.4(H) 7.7(A) -   HCO3 20.0 - 24.0 mEq/L - - - - -   TCO2 0 - 100 mmol/L - - - - -   O2SAT % - - - - -        Exercise Target Goals: Exercise Program Goal: Individual exercise prescription set using results from initial 6 min walk test and THRR while considering  patient's activity barriers and safety.   Exercise Prescription Goal: Initial exercise prescription builds to 30-45 minutes a day of aerobic activity, 2-3 days per week.  Home exercise guidelines will be given to patient during program as part of exercise prescription that the participant will acknowledge.   Education: Aerobic Exercise: - Group verbal and visual presentation on the components of exercise prescription. Introduces F.I.T.T principle from ACSM for exercise prescriptions.  Reviews F.I.T.T. principles of aerobic exercise including progression. Written material given at graduation.   Education: Resistance Exercise: - Group verbal and visual presentation on the components of exercise prescription. Introduces F.I.T.T principle from ACSM for exercise prescriptions  Reviews F.I.T.T. principles of resistance exercise including progression. Written material given at  graduation. Flowsheet Row Cardiac Rehab from 08/29/2021 in [facility] Cardiac and Pulmonary Rehab  Date 06/20/21  Educator [facility]  Instruction Review Code 1- Verbalizes Understanding        Education: Exercise & Equipment Safety: - Individual verbal instruction and demonstration of equipment use and safety with use of the equipment. Flowsheet Row Cardiac Rehab from 08/29/2021 in [facility] Cardiac and Pulmonary Rehab  Date 05/24/21  Educator AS  Instruction Review Code 1- Verbalizes Understanding       Education: Exercise Physiology & General Exercise Guidelines: - Group verbal and written instruction with models to  review the exercise physiology of the cardiovascular system and associated critical values. Provides general exercise guidelines with specific guidelines to those with heart or lung disease.    Education: Flexibility, Balance, Mind/Body Relaxation: - Group verbal and visual presentation with interactive activity on the components of exercise prescription. Introduces F.I.T.T principle from ACSM for exercise prescriptions. Reviews F.I.T.T. principles of flexibility and balance exercise training including progression. Also discusses the mind body connection.  Reviews various relaxation techniques to help reduce and manage stress (i.e. Deep breathing, progressive muscle relaxation, and visualization). Balance handout provided to take home. Written material given at graduation. Flowsheet Row Cardiac Rehab from 08/29/2021 in [facility] Cardiac and Pulmonary Rehab  Date 08/29/21  Educator [facility]  Instruction Review Code 1- Verbalizes Understanding       Activity Barriers & Risk Stratification:  Activity Barriers & Cardiac Risk Stratification - 05/14/21 1406       Activity Barriers & Cardiac Risk Stratification   Activity Barriers Back Problems;Joint Problems;Decreased Ventricular Function;Muscular Weakness    Cardiac Risk Stratification High             6 Minute Walk:  6 Minute Walk     Row  Name 05/24/21 1531 09/26/21 1147       6 Minute Walk   Phase -- Discharge    Distance 590 feet 600 feet    Distance Feet Change -- 10 ft    Walk Time 5 minutes 5 minutes    # of Rest Breaks 2 2    MPH 1.34 1.36    METS 1.1 1    RPE 17 13    Perceived Dyspnea  3 3    VO2 Peak 3.86 3.48    Symptoms Yes (comment) Yes (comment)    Comments leg pain/cramping due to vascular issue 5/10 SOB    Resting HR 90 bpm 86 bpm    Resting BP 126/62 132/56    Resting Oxygen Saturation  98 % --    Exercise Oxygen Saturation  during 6 min walk 96 % --    Max Ex. HR 118 bpm 119 bpm    Max Ex. BP 156/72 134/74    2 Minute Post BP 140/70 --             Oxygen Initial Assessment:   Oxygen Re-Evaluation:   Oxygen Discharge (Final Oxygen Re-Evaluation):   Initial Exercise Prescription:  Initial Exercise Prescription - 05/24/21 1500       Date of Initial Exercise RX and Referring Provider   Date 05/24/21    Referring Provider Gollan      NuStep   Level 1    SPM 80    Minutes 15    METs 1.1      Recumbant Elliptical   Level 1    RPM 50    Minutes 15    METs 1.1      REL-XR   Level 1    Speed 50    Minutes 15    METs 1.1      Track   Laps 10    Minutes 15      Prescription Details   Frequency (times per week) 3    Duration Progress to 30 minutes of continuous aerobic without signs/symptoms of physical distress      Intensity   THRR 40-80% of Max Heartrate 107-124    Ratings of Perceived Exertion 11-13    Perceived Dyspnea 0-4      Progression   Progression Continue to  progress workloads to maintain intensity without signs/symptoms of physical distress.      Resistance Training   Training Prescription Yes    Weight 3 lb    Reps 10-15             Perform Capillary Blood Glucose checks as needed.  Exercise Prescription Changes:   Exercise Prescription Changes     Row Name 05/24/21 1600 06/26/21 1500 07/12/21 0800 07/23/21 1000 07/24/21 0800      Response to Exercise   Blood Pressure (Admit) 126/62 134/74 122/62 -- 126/58   Blood Pressure (Exercise) 156/72 126/68 114/58 -- 122/60   Blood Pressure (Exit) 142/70 130/68 104/56 -- 112/54   Heart Rate (Admit) 90 bpm 89 bpm 78 bpm -- 94 bpm   Heart Rate (Exercise) 118 bpm 111 bpm 101 bpm -- 100 bpm   Heart Rate (Exit) 93 bpm 87 bpm 89 bpm -- 90 bpm   Oxygen Saturation (Admit) 98 % -- -- -- --   Oxygen Saturation (Exercise) 96 % -- -- -- --   Rating of Perceived Exertion (Exercise) 17 12 13  -- 13   Perceived Dyspnea (Exercise) 3 -- -- -- --   Symptoms 5/10 leg cramp/pain fatigue none -- fatigue in legs   Comments -- second full day of exercise -- -- --   Duration -- Progress to 30 minutes of  aerobic without signs/symptoms of physical distress Progress to 30 minutes of  aerobic without signs/symptoms of physical distress -- Progress to 30 minutes of  aerobic without signs/symptoms of physical distress   Intensity -- THRR unchanged THRR unchanged -- THRR unchanged     Progression   Progression -- Continue to progress workloads to maintain intensity without signs/symptoms of physical distress. Continue to progress workloads to maintain intensity without signs/symptoms of physical distress. -- Continue to progress workloads to maintain intensity without signs/symptoms of physical distress.   Average METs -- 1.97 1.96 -- 2.5     Resistance Training   Training Prescription -- Yes Yes -- Yes   Weight -- 3 lb 3 lb -- 3 lb   Reps -- 10-15 10-15 -- 10-15     Interval Training   Interval Training -- No No -- No     NuStep   Level -- 1 3 -- 4   Minutes -- 15 30 -- 15   METs -- 2.5 2 -- 2.5     T5 Nustep   Level -- -- 3 -- --   Minutes -- -- 15 -- --   METs -- -- 1.8 -- --     Track   Laps -- 8 10 -- 2   Minutes -- 15 15 -- 15   METs -- 1.44 1.54 -- --     Home Exercise Plan   Plans to continue exercise at -- -- -- Home (comment)  walking, [employer] staff videos Home (comment)   walking, [employer] staff videos   Frequency -- -- -- Add 2 additional days to program exercise sessions.  Start with 1 Add 2 additional days to program exercise sessions.  Start with 1   Initial Home Exercises Provided -- -- -- 07/23/21 07/23/21    Row Name 08/08/21 0700 08/20/21 0900 09/04/21 1400 09/17/21 1000 10/02/21 1400     Response to Exercise   Blood Pressure (Admit) 122/58 118/60 128/62 124/56 134/66   Blood Pressure (Exercise) 132/82 -- -- -- --   Blood Pressure (Exit) 142/70 122/60 126/60 106/62 130/62   Heart Rate (Admit) 93  bpm 59 bpm 65 bpm 97 bpm 85 bpm   Heart Rate (Exercise) 119 bpm 120 bpm 78 bpm 106 bpm 94 bpm   Heart Rate (Exit) 112 bpm 112 bpm 74 bpm 80 bpm 87 bpm   Rating of Perceived Exertion (Exercise) 11 12 12 14 12    Duration Progress to 30 minutes of  aerobic without signs/symptoms of physical distress Progress to 30 minutes of  aerobic without signs/symptoms of physical distress Progress to 30 minutes of  aerobic without signs/symptoms of physical distress Progress to 30 minutes of  aerobic without signs/symptoms of physical distress Progress to 30 minutes of  aerobic without signs/symptoms of physical distress   Intensity THRR unchanged THRR unchanged THRR unchanged THRR unchanged THRR unchanged     Progression   Progression Continue to progress workloads to maintain intensity without signs/symptoms of physical distress. Continue to progress workloads to maintain intensity without signs/symptoms of physical distress. Continue to progress workloads to maintain intensity without signs/symptoms of physical distress. Continue to progress workloads to maintain intensity without signs/symptoms of physical distress. Continue to progress workloads to maintain intensity without signs/symptoms of physical distress.   Average METs 2.3 2.01 2.4 2.11 --     Resistance Training   Training Prescription Yes Yes Yes Yes Yes   Weight 3 lb 3 lb 4 lb 4 lb 4 lb   Reps 10-15 10-15 10-15  10-15 10-15     Interval Training   Interval Training No No No No No     NuStep   Level 4 4 4 4  --   Minutes 15 30 30 30  --   METs 2.3 1.9 2.8 2 --     Biostep-RELP   Level -- 4 -- -- --   Minutes -- 15 -- -- --   METs -- 2 -- -- --     Track   Laps -- -- 3 10 --   Minutes -- -- -- 15 --   METs -- -- -- 1.54 --     Home Exercise Plan   Plans to continue exercise at Home (comment)  walking, [employer] staff videos Home (comment)  walking, [employer] staff videos Home (comment)  walking, [employer] staff videos Home (comment)  walking, Youtube staff videos Home (comment)  walking, Youtube staff videos   Frequency Add 2 additional days to program exercise sessions.  Start with 1 Add 2 additional days to program exercise sessions.  Start with 1 Add 2 additional days to program exercise sessions.  Start with 1 Add 2 additional days to program exercise sessions.  Start with 1 Add 2 additional days to program exercise sessions.  Start with 1   Initial Home Exercises Provided 07/23/21 07/23/21 07/23/21 07/23/21 07/23/21            Exercise Comments:   Exercise Comments     Row Name 06/20/21 1018 10/05/21 0942         Exercise Comments First full day of exercise!  Patient was oriented to gym and equipment including functions, settings, policies, and procedures.  Patient's individual exercise prescription and treatment plan were reviewed.  All starting workloads were established based on the results of the 6 minute walk test done at initial orientation visit.  The plan for exercise progression was also introduced and progression will be customized based on patient's performance and goals. Jeshawn graduated today from  rehab with 35 sessions completed.  Details of the patient's exercise prescription and what He needs to do in order to continue  the prescription and progress were discussed with patient.  Patient was given a copy of prescription and goals.  Patient verbalized understanding.  Lyman plans  to continue to exercise by going to the [facility].               Exercise Goals and Review:   Exercise Goals     Row Name 05/24/21 1602             Exercise Goals   Increase Physical Activity Yes       Intervention Provide advice, education, support and counseling about physical activity/exercise needs.;Develop an individualized exercise prescription for aerobic and resistive training based on initial evaluation findings, risk stratification, comorbidities and participant's personal goals.       Expected Outcomes Short Term: Attend rehab on a regular basis to increase amount of physical activity.;Long Term: Add in home exercise to make exercise part of routine and to increase amount of physical activity.;Long Term: Exercising regularly at least 3-5 days a week.       Increase Strength and Stamina Yes       Intervention Provide advice, education, support and counseling about physical activity/exercise needs.;Develop an individualized exercise prescription for aerobic and resistive training based on initial evaluation findings, risk stratification, comorbidities and participant's personal goals.       Expected Outcomes Short Term: Increase workloads from initial exercise prescription for resistance, speed, and METs.;Long Term: Improve cardiorespiratory fitness, muscular endurance and strength as measured by increased METs and functional capacity (6MWT)       Able to understand and use rate of perceived exertion (RPE) scale Yes       Intervention Provide education and explanation on how to use RPE scale       Expected Outcomes Short Term: Able to use RPE daily in rehab to express subjective intensity level;Long Term:  Able to use RPE to guide intensity level when exercising independently       Able to understand and use Dyspnea scale Yes       Intervention Provide education and explanation on how to use Dyspnea scale       Expected Outcomes Short Term: Able to use Dyspnea scale daily in  rehab to express subjective sense of shortness of breath during exertion;Long Term: Able to use Dyspnea scale to guide intensity level when exercising independently       Knowledge and understanding of Target Heart Rate Range (THRR) Yes       Intervention Provide education and explanation of THRR including how the numbers were predicted and where they are located for reference       Expected Outcomes Short Term: Able to state/look up THRR;Short Term: Able to use daily as guideline for intensity in rehab;Long Term: Able to use THRR to govern intensity when exercising independently       Able to check pulse independently Yes       Intervention Provide education and demonstration on how to check pulse in carotid and radial arteries.;Review the importance of being able to check your own pulse for safety during independent exercise       Expected Outcomes Short Term: Able to explain why pulse checking is important during independent exercise;Long Term: Able to check pulse independently and accurately       Understanding of Exercise Prescription Yes       Intervention Provide education, explanation, and written materials on patient's individual exercise prescription       Expected Outcomes Short Term: Able to explain  program exercise prescription;Long Term: Able to explain home exercise prescription to exercise independently                Exercise Goals Re-Evaluation :  Exercise Goals Re-Evaluation     Row Name 06/20/21 1018 06/26/21 1529 07/12/21 0853 07/23/21 1030 07/24/21 0835     Exercise Goal Re-Evaluation   Exercise Goals Review Increase Physical Activity;Increase Strength and Stamina;Able to understand and use rate of perceived exertion (RPE) scale;Knowledge and understanding of Target Heart Rate Range (THRR);Able to understand and use Dyspnea scale;Able to check pulse independently;Understanding of Exercise Prescription Increase Physical Activity;Increase Strength and Stamina;Understanding  of Exercise Prescription Increase Physical Activity;Increase Strength and Stamina;Understanding of Exercise Prescription Increase Physical Activity;Increase Strength and Stamina;Understanding of Exercise Prescription Increase Physical Activity;Increase Strength and Stamina;Understanding of Exercise Prescription   Comments Reviewed RPE and dyspnea scales, THR and program prescription with pt today.  Pt voiced understanding and was given a copy of goals to take home. Jawara has completed his first two full days of exercise.  We will continue to encourage good attendance and monitor his progress. Wallice is doing well in rehab. He has already increased his level to 3 on the T4 Nustep and tolerated 10 laps on the track. Will continue to monitor. Reviewed home exercise with pt today.  Pt plans to walk or use the Youtube videos for exercise. Start with slow progression and build up tolerance. Reviewed THR, pulse, RPE, sign and symptoms, pulse oximetery and when to call 911 or MD.  Also discussed weather considerations and indoor options.  Pt voiced understanding. Donterrius is doing well in rehab.  He is is up to 2.5 METs on the NuStep.  He does try to walk each day and usually averages about two laps each day.  We will continue to montior his progress.   Expected Outcomes Short: Use RPE daily to regulate intensity. Long: Follow program prescription in THR. Short: Continue to attend rehab regularly Long: Continue to follow prescription Short: Continue to increase number of laps as tolerated Long: Continue to build up overall strength and stamina Short: Start with 1 day of exercise at home Long: Continue to exercise independently at home at appropriate prescription Short: Continue to improve on walking LOng: Continue to improve stamina    Row Name 08/08/21 0755 08/20/21 0908 09/04/21 1419 09/17/21 1006 09/24/21 1055     Exercise Goal Re-Evaluation   Exercise Goals Review Increase Physical Activity;Increase Strength and Stamina  Increase Physical Activity;Increase Strength and Stamina Increase Physical Activity;Increase Strength and Stamina Increase Physical Activity;Increase Strength and Stamina Increase Physical Activity;Increase Strength and Stamina   Comments Azell is up to level 4 on NS.  Staff will encourage him to continue adding laps walking. Kohei has not walked here at rehab as OT wants him to use his walker due to a fall risk. Patient has not brought his walker with him and refuses to walk. Patient has been continuing to do level 4 on BS and T4 Nustep and has not shown much improvement compared to when he first started. Staff will talk to patient to evaluate if patient is more appropriate for PT. Navarre got his walker and is working on walking the track.  He has done up to 6 laps.  We will monitor progress. Bobak is back to walking again at [facility] and is now back up to 10 laps! Staff will encourage him to continue walking at rehab, while using his walker. Wister has been able to walk better with  a walker.  He did have a fall last week - called 911 and they checked him out ok so he didnt go to ER.   Expected Outcomes Short:  work up to 5 laps walking Long:  improve overall stamina Short: Discuss rehab vs. PT Long: Continue to increase overall strength and stamina Short: continue to work on walking  Long:  walk 10 laps Short: Increase tolerance on track as tolerateed Long: Continue to increase overall MET level --    [place] Name 10/02/21 1403             Exercise Goal Re-Evaluation   Exercise Goals Review Increase Physical Activity;Increase Strength and Stamina       Comments Keshon walked about the same length for his post walk test but rated it a 13 instead of 17 RPE.  He will complete HT in 4 more sessions.       Expected Outcomes Short:  complete HT Long:maintain exercise on his own                Discharge Exercise Prescription (Final Exercise Prescription Changes):  Exercise Prescription Changes - 10/02/21 1400        Response to Exercise   Blood Pressure (Admit) 134/66    Blood Pressure (Exit) 130/62    Heart Rate (Admit) 85 bpm    Heart Rate (Exercise) 94 bpm    Heart Rate (Exit) 87 bpm    Rating of Perceived Exertion (Exercise) 12    Duration Progress to 30 minutes of  aerobic without signs/symptoms of physical distress    Intensity THRR unchanged      Progression   Progression Continue to progress workloads to maintain intensity without signs/symptoms of physical distress.      Resistance Training   Training Prescription Yes    Weight 4 lb    Reps 10-15      Interval Training   Interval Training No      Home Exercise Plan   Plans to continue exercise at Home (comment)   walking, Youtube staff videos   Frequency Add 2 additional days to program exercise sessions.   Start with 1   Initial Home Exercises Provided 07/23/21             Nutrition:  Target Goals: Understanding of nutrition guidelines, daily intake of sodium '1500mg'$ , cholesterol '200mg'$ , calories 30% from fat and 7% or less from saturated fats, daily to have 5 or more servings of fruits and vegetables.  Education: All About Nutrition: -Group instruction provided by verbal, written material, interactive activities, discussions, models, and posters to present general guidelines for heart healthy nutrition including fat, fiber, MyPlate, the role of sodium in heart healthy nutrition, utilization of the nutrition label, and utilization of this knowledge for meal planning. Follow up email sent as well. Written material given at graduation. Flowsheet Row Cardiac Rehab from 08/29/2021 in [facility] Cardiac and Pul[facility] Rehab  Date 07/04/21  Educator [facility]  Instruction Review Code 1- Verbalizes Understanding       Biometrics:  Pre Biometrics - 05/24/21 1603       Pre Biometrics   Height 5' 9.5" (1.765 m)    Weight 190 lb (86.2 kg)    BMI (Calculated) 27.67              Nutrition Therapy Plan and Nutrition Goals:   Nutrition Therapy & Goals - 08/15/21 1535       Nutrition Therapy   RD appointment deferred Yes   [name]  is still working towards weight gain with outpatient RD, his weakness is worsening at this time. Will continue to follow up.            Nutrition Assessments:  MEDIFICTS Score Key: ?70 Need to make dietary changes  40-70 Heart Healthy Diet ? 40 Therapeutic Level Cholesterol Diet  Flowsheet Row Cardiac Rehab from 05/24/2021 in [facility] Cardiac and Pulmonary Rehab  Picture Your Plate Total Score on Admission 56      Picture Your Plate Scores: <70 Unhealthy dietary pattern with much room for improvement. 41-50 Dietary pattern unlikely to meet recommendations for good health and room for improvement. 51-60 More healthful dietary pattern, with some room for improvement.  >60 Healthy dietary pattern, although there may be some specific behaviors that could be improved.    Nutrition Goals Re-Evaluation:  Nutrition Goals Re-Evaluation     [place] Name 06/27/21 1007 07/23/21 1010 08/20/21 1006 09/24/21 1053 10/03/21 0957     Goals   Current Weight 184 lb (83.5 kg) -- -- -- 185 lb (83.9 kg)   Nutrition Goal Meet with the Dietician. Meet with the [place]. ST/LT: continue to see outpatient RD -- --   Comment Enrrique has been going out a lot to eat since his wifes back hurts when she stands. He has set an appointment with the dietician. Caine is currently seeing a dietican outside of rehab. Since he had radiation, he hasn't had an appetite.He still needs to meet with the RD here to establish goals. Eva is continues to see a dietitian outside of rehab. Since he had radiation, he hasn't had an appetite, she provided him with medication to help with hunger. SHe has told him to increase fat to improve calorie intake. Will continue to follow up. Bijon still sees the RD through cancer center.  He is on a medication to help with appetite.  He says appetite has been better lately Patient has no questions  regarding his nutrition.   Expected Outcome Short: meet with the dietician. Long: maintain a diet that pertains to him. Short: Meet with RD Long: Continue to eat heart healthy diet ST/LT: continue to see outpatient RD ST/LT: continue to follow outpatient RD advice Short: continue seeing his RD. Long: maintain current diet.            Nutrition Goals Discharge (Final Nutrition Goals Re-Evaluation):  Nutrition Goals Re-Evaluation - 10/03/21 0957       Goals   Current Weight 185 lb (83.9 kg)    Comment Patient has no questions regarding his nutrition.    Expected Outcome Short: continue seeing his RD. Long: maintain current diet.             Psychosocial: Target Goals: Acknowledge presence or absence of significant depression and/or stress, maximize coping skills, provide positive support system. Participant is able to verbalize types and ability to use techniques and skills needed for reducing stress and depression.   Education: Stress, Anxiety, and Depression - Group verbal and visual presentation to define topics covered.  Reviews how body is impacted by stress, anxiety, and depression.  Also discusses healthy ways to reduce stress and to treat/manage anxiety and depression.  Written material given at graduation.   Education: Sleep Hygiene -Provides group verbal and written instruction about how sleep can affect your health.  Define sleep hygiene, discuss sleep cycles and impact of sleep habits. Review good sleep hygiene tips.    Initial Review & Psychosocial Screening:  Initial Psych Review & Screening - 05/14/21  1411       Initial Review   Current issues with Current Stress Concerns    Source of Stress Concerns Unable to participate in former interests or hobbies;Unable to perform yard/household activities;Chronic Illness      Family Dynamics   Good Support System? Yes   wife     Barriers   Psychosocial barriers to participate in program There are no identifiable  barriers or psychosocial needs.;The patient should benefit from training in stress management and relaxation.      Screening Interventions   Interventions Encouraged to exercise;Provide feedback about the scores to participant;To provide support and resources with identified psychosocial needs;Program counselor consult    Expected Outcomes Short Term goal: Utilizing psychosocial counselor, staff and physician to assist with identification of specific Stressors or current issues interfering with healing process. Setting desired goal for each stressor or current issue identified.;Long Term Goal: Stressors or current issues are controlled or eliminated.;Short Term goal: Identification and review with participant of any Quality of Life or Depression concerns found by scoring the questionnaire.;Long Term goal: The participant improves quality of Life and PHQ9 Scores as seen by post scores and/or verbalization of changes             Quality of Life Scores:   Quality of Life - 05/24/21 1611       Quality of Life   Select Quality of Life      Quality of Life Scores   Health/Function Pre 23.9 %    Socioeconomic Pre 30 %    Psych/Spiritual Pre 28.29 %    Family Pre 29.5 %    GLOBAL Pre 26.79 %            Scores of 19 and below usually indicate a poorer quality of life in these areas.  A difference of  2-3 points is a clinically meaningful difference.  A difference of 2-3 points in the total score of the Quality of Life Index has been associated with significant improvement in overall quality of life, self-image, physical symptoms, and general health in studies assessing change in quality of life.  PHQ-9: Recent Review Flowsheet Data     Depression screen [facility] 2/9 05/24/2021 11/08/2020   Decreased Interest 0 0   Down, Depressed, Hopeless 0 0   PHQ - 2 Score 0 0   Altered sleeping 0 -   Tired, decreased energy 0 -   Change in appetite 0 -   Feeling bad or failure about yourself  0 -    Trouble concentrating 0 -   Moving slowly or fidgety/restless 0 -   Suicidal thoughts 0 -   PHQ-9 Score 0 -      Interpretation of Total Score  Total Score Depression Severity:  1-4 = Minimal depression, 5-9 = Mild depression, 10-14 = Moderate depression, 15-19 = Moderately severe depression, 20-27 = Severe depression   Psychosocial Evaluation and Intervention:  Psychosocial Evaluation - 05/14/21 1416       Psychosocial Evaluation & Interventions   Interventions Encouraged to exercise with the program and follow exercise prescription;Stress management education    Comments Senan reports doing well. His hearing is a big stressor for him, so he relies on his wife's help in this area as well as managing his health care. He states he is sleeping well now that they have gotten his medication adjusted. He did have some back issues that have caused walking difficulties at times and increased his weakness which has led him to  not doing whatever he wants, so he is looking forward to working on his balance and his stamina. He is also starting radiation medication so he is a little hesitant as to how he will feel during treatment.    Expected Outcomes Short; attend Cardiac Rehab for education and Exercise. Long: develop and maintain positive self care habits.    Continue Psychosocial Services  Follow up required by staff             Psychosocial Re-Evaluation:  Psychosocial Re-Evaluation     [place] Name 06/27/21 1006 07/23/21 1009 08/20/21 1007 09/24/21 1052 10/03/21 0956     Psychosocial Re-Evaluation   Current issues with None Identified Current Stress Concerns None Identified Current Stress Concerns None Identified   Comments Patient reports no issues with their current mental states, sleep, stress, depression or anxiety. Will follow up with patient in a few weeks for any changes. Early has really good support from his wife of 81 years! He just finished radiation about a month ago and is  experiencing some side effects from that, such as fatigue and loss of appetite. Other than that, he denies any other concerns or stresses. He is not on any medication for depression or anxiety. He is seeing his cardiologist this week to get results from his testing and is anxious to hear back. Early has really good support from his wife of 40 years and reports no stressors at this time, but has reported some regarding not being able to do what he used to physically in the past. He just finished radiation about a month ago and is experiencing some side effects from that, such as fatigue and loss of appetite.  He is not on any medication for depression or anxiety. He likes to watch the history channel. Justen reports his stress is decreased since he completed radiation and they got all of cancer.  He has follow up in 3 months. Patient reports no issues with their current mental states, sleep, stress, depression or anxiety. Will follow up with patient in a few weeks for any changes.   Expected Outcomes Short: Continue to exercise regularly to support mental health and notify staff of any changes. Long: maintain mental health and well being through teaching of rehab or prescribed medications independently. Short: Continue attendance with rehab to maintain energy levels Long: Continue to utilize exercise for stress management and maintain positive attitude Short: Continue attendance with rehab to maintain energy levels Long: Continue to utilize exercise for stress management and maintain positive attitude Short: continue to atend reahb to build stamina Long:  maintain positive outlook Short: Continue to exercise regularly to support mental health and notify staff of any changes. Long: maintain mental health and well being through teaching of rehab or prescribed medications independently.   Interventions Encouraged to attend Cardiac Rehabilitation for the exercise Encouraged to attend Cardiac Rehabilitation for the  exercise Encouraged to attend Cardiac Rehabilitation for the exercise -- Encouraged to attend Cardiac Rehabilitation for the exercise   Continue Psychosocial Services  Follow up required by staff Follow up required by staff Follow up required by staff -- Follow up required by staff     Initial Review   Source of Stress Concerns -- -- Unable to participate in former interests or hobbies;Unable to perform yard/household activities;Chronic Illness -- --            Psychosocial Discharge (Final Psychosocial Re-Evaluation):  Psychosocial Re-Evaluation - 10/03/21 0956       Psychosocial Re-Evaluation  Current issues with None Identified    Comments Patient reports no issues with their current mental states, sleep, stress, depression or anxiety. Will follow up with patient in a few weeks for any changes.    Expected Outcomes Short: Continue to exercise regularly to support mental health and notify staff of any changes. Long: maintain mental health and well being through teaching of rehab or prescribed medications independently.    Interventions Encouraged to attend Cardiac Rehabilitation for the exercise    Continue Psychosocial Services  Follow up required by staff             Vocational Rehabilitation: Provide vocational rehab assistance to qualifying candidates.   Vocational Rehab Evaluation & Intervention:  Vocational Rehab - 05/14/21 1411       Initial Vocational Rehab Evaluation & Intervention   Assessment shows need for Vocational Rehabilitation No             Education: Education Goals: Education classes will be provided on a variety of topics geared toward better understanding of heart health and risk factor modification. Participant will state understanding/return demonstration of topics presented as noted by education test scores.  Learning Barriers/Preferences:  Learning Barriers/Preferences - 05/14/21 1406       Learning Barriers/Preferences   Learning  Barriers Hearing    Learning Preferences Individual Instruction             General Cardiac Education Topics:  AED/CPR: - Group verbal and written instruction with the use of models to demonstrate the basic use of the AED with the basic ABC's of resuscitation.   Anatomy and Cardiac Procedures: - Group verbal and visual presentation and models provide information about basic cardiac anatomy and function. Reviews the testing methods done to diagnose heart disease and the outcomes of the test results. Describes the treatment choices: Medical Management, Angioplasty, or Coronary Bypass Surgery for treating various heart conditions including Myocardial Infarction, Angina, Valve Disease, and Cardiac Arrhythmias.  Written material given at graduation. Flowsheet Row Cardiac Rehab from 08/29/2021 in [facility] Cardiac and Pulmonary Rehab  Date 06/20/21  Educator SB  Instruction Review Code 1- Verbalizes Understanding       Medication Safety: - Group verbal and visual instruction to review commonly prescribed medications for heart and lung disease. Reviews the medication, class of the drug, and side effects. Includes the steps to properly store meds and maintain the prescription regimen.  Written material given at graduation. Flowsheet Row Cardiac Rehab from 08/29/2021 in [facility] Cardiac and Pulmonary Rehab  Date 07/11/21  Educator SB  Instruction Review Code 1- Verbalizes Understanding       Intimacy: - Group verbal instruction through game format to discuss how heart and lung disease can affect sexual intimacy. Written material given at graduation..   Know Your Numbers and Heart Failure: - Group verbal and visual instruction to discuss disease risk factors for cardiac and pulmonary disease and treatment options.  Reviews associated critical values for Overweight/Obesity, Hypertension, Cholesterol, and Diabetes.  Discusses basics of heart failure: signs/symptoms and treatments.  Introduces Heart  Failure Zone chart for action plan for heart failure.  Written material given at graduation.   Infection Prevention: - Provides verbal and written material to individual with discussion of infection control including proper hand washing and proper equipment cleaning during exercise session. Flowsheet Row Cardiac Rehab from 08/29/2021 in [facility] Cardiac and Pulmonary Rehab  Date 05/24/21  Educator AS  Instruction Review Code 1- Verbalizes Understanding       Falls Prevention: -  Provides verbal and written material to individual with discussion of falls prevention and safety. Flowsheet Row Cardiac Rehab from 08/29/2021 in [facility] Cardiac and Pulmonary Rehab  Date 05/24/21  Educator AS  Instruction Review Code 1- Verbalizes Understanding       Other: -Provides group and verbal instruction on various topics (see comments)   Knowledge Questionnaire Score:   Core Components/Risk Factors/Patient Goals at Admission:  Personal Goals and Risk Factors at Admission - 05/24/21 1604       Core Components/Risk Factors/Patient Goals on Admission   Diabetes Yes    Intervention Provide education about signs/symptoms and action to take for hypo/hyperglycemia.;Provide education about proper nutrition, including hydration, and aerobic/resistive exercise prescription along with prescribed medications to achieve blood glucose in normal ranges: Fasting glucose 65-99 mg/dL    Expected Outcomes Short Term: Participant verbalizes understanding of the signs/symptoms and immediate care of hyper/hypoglycemia, proper foot care and importance of medication, aerobic/resistive exercise and nutrition plan for blood glucose control.;Long Term: Attainment of HbA1C < 7%.    Heart Failure Yes    Intervention Provide a combined exercise and nutrition program that is supplemented with education, support and counseling about heart failure. Directed toward relieving symptoms such as shortness of breath, decreased exercise  tolerance, and extremity edema.    Expected Outcomes Improve functional capacity of life;Short term: Attendance in program 2-3 days a week with increased exercise capacity. Reported lower sodium intake. Reported increased fruit and vegetable intake. Reports medication compliance.;Short term: Daily weights obtained and reported for increase. Utilizing diuretic protocols set by physician.;Long term: Adoption of self-care skills and reduction of barriers for early signs and symptoms recognition and intervention leading to self-care maintenance.    Hypertension Yes    Intervention Provide education on lifestyle modifcations including regular physical activity/exercise, weight management, moderate sodium restriction and increased consumption of fresh fruit, vegetables, and low fat dairy, alcohol moderation, and smoking cessation.;Monitor prescription use compliance.    Expected Outcomes Short Term: Continued assessment and intervention until BP is < 140/19mm HG in hypertensive participants. < 130/54mm HG in hypertensive participants with diabetes, heart failure or chronic kidney disease.;Long Term: Maintenance of blood pressure at goal levels.             Education:Diabetes - Individual verbal and written instruction to review signs/symptoms of diabetes, desired ranges of glucose level fasting, after meals and with exercise. Acknowledge that pre and post exercise glucose checks will be done for 3 sessions at entry of program. [place] from 05/14/2021 in [facility] Cardiac and Pulmonary Rehab  Date 05/14/21  Educator [facility]  Instruction Review Code 1- Verbalizes Understanding       Core Components/Risk Factors/Patient Goals Review:   Goals and Risk Factor Review     Row Name 06/27/21 1003 07/23/21 1004 08/20/21 1002 09/24/21 1048 10/03/21 0954     Core Components/Risk Factors/Patient Goals Review   Personal Goals Review Diabetes;Weight Management/Obesity Diabetes;Weight  Management/Obesity;Heart Failure Diabetes;Heart Failure Diabetes;Heart Failure Weight Management/Obesity   Review Anees has been checking his sugar at home and has been in the 130s. He has had most of his blood sugar checks at [facility] and they have been within normal ranges. He would like to lose a little bit of weight in the program. He would like to reach a weight goal of 170 pounds. Amr has not been checking his sugars at home and says he will start getting in the habit again. He discussed the importance of checking them and I encouraged him  to ask his doctor to clarify how often he should be checking them. He has been losing weight and seeing a dietician to help him gain weight. He has a scale at home and weighs himself. He knows to look out for sudden weight gain in a short period of time with heart failure. Wife is good support with maintaining his medications.  He has been feeling a little winded, but has an echo tomorrow to assess, and talking to his doctor this week for results. Gevon has a walker at home, but has been getting weak. He finished chemo last month. He continues to see outpatient RD regarding his weight - gave him pills to encourage hunger. His weight has been stable for the time being. He will sometimes check his BG at home - not consistent (BG: he does not recall) - he knows he should check it more often. He continues to weigh himself at home. He is taking all of his medications as directed with the help of his wife. He reports no issues with medications. His doctor has talked to him about potentially needing supplemental O2. Chancellor reports  taking all medication as directed with the help of his wife.  She also helps make sure he eats enough.  he is on a medication to help with appetite.  He is able to do more now using a walker instead of a cane.  he had a fall last week when he wasnt using his walker - wife called 911 and vitals were ok so he did not go to ER.  BG has been good according  to [place] when he checks at home.  He finished radiation last week and they said all cancer is gone and he has a follow up in 3 months. Tavone states he is at a good weight currently. He wans to maintain currently at 185 pounds. He has not been checking is blood pressure at home but knows he should. He is going to start checking his pressures at home more.   Expected Outcomes Short: lose some weight. Long: reach a weight goal of 170 pounds. Short: Talk to doctor Long: Continue to manage lifestyle risk factors Short: check BG regularly Long: Continue to manage lifestyle risk factors Short:  continue to take meds as directed and use walker Long:manage risk factors long term Short: check blood pressure at home. Long: maintain blood pressure measurments at home independently            Core Components/Risk Factors/Patient Goals at Discharge (Final Review):   Goals and Risk Factor Review - 10/03/21 0954       Core Components/Risk Factors/Patient Goals Review   Personal Goals Review Weight Management/Obesity    Review Kelli states he is at a good weight currently. He wans to maintain currently at 185 pounds. He has not been checking is blood pressure at home but knows he should. He is going to start checking his pressures at home more.    Expected Outcomes Short: check blood pressure at home. Long: maintain blood pressure measurments at home independently             ITP Comments:  ITP Comments     Row Name 05/14/21 1404 05/24/21 1617 06/06/21 0750 06/20/21 1017 07/04/21 1200   ITP Comments Initial telephone orientation completed. Diagnosis can be found in [facility] 5/2. EP orientation scheduled for Thursday 6/2 at 2pm. Completed 6MWT and gym orientation. Initial ITP created and sent for review to Dr. Emily Filbert, Medical Director. [place]  Day review completed. Medical Director ITP review done, changes made as directed, and signed approval by Medical Director.  new to program First full day of exercise!   Patient was oriented to gym and equipment including functions, settings, policies, and procedures.  Patient's individual exercise prescription and treatment plan were reviewed.  All starting workloads were established based on the results of the 6 minute walk test done at initial orientation visit.  The plan for exercise progression was also introduced and progression will be customized based on patient's performance and goals. 30 Day review completed. Medical Director ITP review done, changes made as directed, and signed approval by Medical Director.    [place] Name 08/01/21 0825 08/02/21 0851 08/29/21 0758 09/26/21 1427 10/05/21 0942   ITP Comments 30 Day review completed. Medical Director ITP review done, changes made as directed, and signed approval by Medical Director. Per Gwenette Greet, OT, patient was evaluated by OT who stated medium fall risk. Patient should start using walker when at rehab per OT. Getting evaluated for possible referral for PT. Need to discuss with patient on his decision. 30 Day review completed. Medical Director ITP review done, changes made as directed, and signed approval by Medical Director. 30 day review completed. ITP sent to Dr. Emily Filbert, Medical Director of Cardiac Rehab. Continue with ITP unless changes are made by physician. Seven graduated today from  rehab with 35 sessions completed.  Details of the patient's exercise prescription and what He needs to do in order to continue the prescription and progress were discussed with patient.  Patient was given a copy of prescription and goals.  Patient verbalized understanding.  Erol plans to continue to exercise by going to the [facility].            Comments: discharge ITP

## 2021-10-05 NOTE — Progress Notes (Signed)
Discharge Progress Report  Patient Details  Name: Howard [name] MRN: [id number] Date of Birth: [birth year] Referring Provider:   Flowsheet Row Cardiac Rehab from 05/24/2021 in [facility]  Referring Provider Gollan        Number of Visits: 22  Reason for Discharge:  Patient reached a stable level of exercise. Patient independent in their exercise. Patient has met program and personal goals.  Smoking History:  Social History   Tobacco Use  Smoking Status Former   Packs/day: 1.00   Years: 56.00   Pack years: 56.00   Types: Cigarettes   Quit date: 2011   Years since quitting: 11.7  Smokeless Tobacco Never    Diagnosis:  Heart failure, systolic and diastolic, chronic (HCC)  ADL UCSD:   Initial Exercise Prescription:  Initial Exercise Prescription - 05/24/21 1500       Date of Initial Exercise RX and Referring Provider   Date 05/24/21    Referring Provider Gollan      NuStep   Level 1    SPM 80    Minutes 15    METs 1.1      Recumbant Elliptical   Level 1    RPM 50    Minutes 15    METs 1.1      REL-XR   Level 1    Speed 50    Minutes 15    METs 1.1      Track   Laps 10    Minutes 15      Prescription Details   Frequency (times per week) 3    Duration Progress to 30 minutes of continuous aerobic without signs/symptoms of physical distress      Intensity   THRR 40-80% of Max Heartrate 107-124    Ratings of Perceived Exertion 11-13    Perceived Dyspnea 0-4      Progression   Progression Continue to progress workloads to maintain intensity without signs/symptoms of physical distress.      Resistance Training   Training Prescription Yes    Weight 3 lb    Reps 10-15             Discharge Exercise Prescription (Final Exercise Prescription Changes):  Exercise Prescription Changes - 10/02/21 1400       Response to Exercise   Blood Pressure (Admit) 134/66    Blood Pressure (Exit) 130/62    Heart Rate (Admit) 85 bpm     Heart Rate (Exercise) 94 bpm    Heart Rate (Exit) 87 bpm    Rating of Perceived Exertion (Exercise) 12    Duration Progress to 30 minutes of  aerobic without signs/symptoms of physical distress    Intensity THRR unchanged      Progression   Progression Continue to progress workloads to maintain intensity without signs/symptoms of physical distress.      Resistance Training   Training Prescription Yes    Weight 4 lb    Reps 10-15      Interval Training   Interval Training No      Home Exercise Plan   Plans to continue exercise at Home (comment)   walking, Youtube staff videos   Frequency Add 2 additional days to program exercise sessions.   Start with 1   Initial Home Exercises Provided 07/23/21             Functional Capacity:  6 Minute Walk     Row Name 05/24/21 1531 09/26/21 1147  6 Minute Walk   Phase -- Discharge    Distance 590 feet 600 feet    Distance Feet Change -- 10 ft    Walk Time 5 minutes 5 minutes    # of Rest Breaks 2 2    MPH 1.34 1.36    METS 1.1 1    RPE 17 13    Perceived Dyspnea  3 3    VO2 Peak 3.86 3.48    Symptoms Yes (comment) Yes (comment)    Comments leg pain/cramping due to vascular issue 5/10 SOB    Resting HR 90 bpm 86 bpm    Resting BP 126/62 132/56    Resting Oxygen Saturation  98 % --    Exercise Oxygen Saturation  during 6 min walk 96 % --    Max Ex. HR 118 bpm 119 bpm    Max Ex. BP 156/72 134/74    2 Minute Post BP 140/70 --             Psychological, QOL, Others - Outcomes: PHQ 2/9: Depression screen [facility] 2/9 05/24/2021 11/08/2020  Decreased Interest 0 0  Down, Depressed, Hopeless 0 0  PHQ - 2 Score 0 0  Altered sleeping 0 -  Tired, decreased energy 0 -  Change in appetite 0 -  Feeling bad or failure about yourself  0 -  Trouble concentrating 0 -  Moving slowly or fidgety/restless 0 -  Suicidal thoughts 0 -  PHQ-9 Score 0 -  Some recent data might be hidden    Quality of Life:  Quality of Life -  05/24/21 1611       Quality of Life   Select Quality of Life      Quality of Life Scores   Health/Function Pre 23.9 %    Socioeconomic Pre 30 %    Psych/Spiritual Pre 28.29 %    Family Pre 29.5 %    GLOBAL Pre 26.79 %              Nutrition & Weight - Outcomes:  Pre Biometrics - 05/24/21 1603       Pre Biometrics   Height 5' 9.5" (1.765 m)    Weight 190 lb (86.2 kg)    BMI (Calculated) 27.67              Nutrition:  Nutrition Therapy & Goals - 08/15/21 1535       Nutrition Therapy   RD appointment deferred Yes   Howard [name] is still working towards weight gain with outpatient RD, his weakness is worsening at this time. Will continue to follow up.            Nutrition Discharge:   Education Questionnaire Score:   Goals reviewed with patient; copy given to patient.

## 2021-10-08 NOTE — Telephone Encounter (Signed)
Howard Merritts, MD  You Yesterday (10:36 AM)   If it is a cost issue looks like it is $6 with good [email or web address]  TG

## 2021-10-09 ENCOUNTER — Other Ambulatory Visit: Payer: Self-pay | Admitting: Family

## 2021-10-09 ENCOUNTER — Ambulatory Visit: Payer: Medicare Other | Attending: Oncology | Admitting: Physical Therapy

## 2021-10-09 ENCOUNTER — Encounter: Payer: Medicare Other | Admitting: Physical Therapy

## 2021-10-09 ENCOUNTER — Encounter: Payer: Self-pay | Admitting: Physical Therapy

## 2021-10-09 VITALS — BP 133/76 | HR 79

## 2021-10-09 DIAGNOSIS — R262 Difficulty in walking, not elsewhere classified: Secondary | ICD-10-CM | POA: Diagnosis not present

## 2021-10-09 DIAGNOSIS — Z9181 History of falling: Secondary | ICD-10-CM | POA: Insufficient documentation

## 2021-10-09 LAB — CUP PACEART REMOTE DEVICE CHECK
Battery Remaining Longevity: 86 mo
Battery Remaining Percentage: 79 %
Battery Voltage: 3.01 V
Brady Statistic AP VP Percent: 1 %
Brady Statistic AP VS Percent: 1 %
Brady Statistic AS VP Percent: 99 %
Brady Statistic AS VS Percent: 1 %
Brady Statistic RA Percent Paced: 1 %
Brady Statistic RV Percent Paced: 99 %
Date Time Interrogation Session: 20221014020014
Implantable Lead Implant Date: 20111208
Implantable Lead Implant Date: 20111208
Implantable Lead Location: 753859
Implantable Lead Location: 753860
Implantable Lead Model: 1948
Implantable Pulse Generator Implant Date: 20200717
Lead Channel Impedance Value: 350 Ohm
Lead Channel Impedance Value: 430 Ohm
Lead Channel Pacing Threshold Amplitude: 0.75 V
Lead Channel Pacing Threshold Amplitude: 1.25 V
Lead Channel Pacing Threshold Pulse Width: 0.5 ms
Lead Channel Pacing Threshold Pulse Width: 0.5 ms
Lead Channel Sensing Intrinsic Amplitude: 12 mV
Lead Channel Sensing Intrinsic Amplitude: 2.1 mV
Lead Channel Setting Pacing Amplitude: 1.5 V
Lead Channel Setting Pacing Amplitude: 2 V
Lead Channel Setting Pacing Pulse Width: 0.5 ms
Lead Channel Setting Sensing Sensitivity: 4 mV
Pulse Gen Model: 2272
Pulse Gen Serial Number: 9150507

## 2021-10-09 NOTE — Therapy (Signed)
[facility] [address] [place], [address] Phone: [phone]   Fax:  [phone]  Physical Therapy Treatment  Patient Details  Name: [name] MRN: [id number] Date of Birth: 1931-08-05 Referring Provider (PT): Earlie Server   Encounter Date: 10/09/2021   PT End of Session - 10/09/21 1155     Visit Number 2    Number of Visits 17    Date for PT Re-Evaluation 11/06/21    PT Start Time 1117    PT Stop Time 1156    PT Time Calculation (min) 39 min    Equipment Utilized During Treatment Gait belt    Activity Tolerance Patient tolerated treatment well    Behavior During Therapy [facility] for tasks assessed/performed             Past Medical History:  Diagnosis Date   Arthritis    Bell palsy    Bell's palsy 04/12/2015   Cancer [facility])    prostate and skin   Chronic combined systolic and diastolic CHF, NYHA class 1 ([place])    a. 07/2014 Echo: EF 35-40%, Gr 1 DD.   Complete heart block ([place])    a. 11/2010 s/p SJM 2210 [employer] DC PPM, ser# [id number].   Depression    Diabetes mellitus without complication ([place])    Fall 11-10-14   GERD (gastroesophageal reflux disease)    History of prostate cancer    Hyperlipidemia    Hypertension    LBBB (left bundle branch block)    Left-sided Bell's palsy    Lung cancer ([place]) 2016   NICM (nonischemic cardiomyopathy) ([place])    a. 07/2014 Echo: EF 35-40%, mid-apicalanteroseptal DK, Gr 1 DD, mild-mod dil LA.   Non-obstructive CAD    a. 07/2014 Abnl MV;  b. 08/2014 Cath: LM nl, LAD 30p, RI 40p, LCX nl, OM1 40, RCA dominant 30p, 70d-->Med Rx.   Poor balance    Presence of permanent cardiac pacemaker    Sleep apnea    a. cpap   Vertigo    WPW (Wolff-Parkinson-White syndrome)    a. S/P RFCA 1991.    Past Surgical History:  Procedure Laterality Date   ABDOMINAL AORTIC ENDOVASCULAR STENT GRAFT  08/25/2019   Procedure: ABDOMINAL AORTIC ENDOVASCULAR STENT GRAFT;  Surgeon: Algernon Huxley, MD;  Location: [facility] ORS;  Service: Vascular;;   ANGIOPLASTY Left 08/25/2019   Procedure: ANGIOPLASTY;  Surgeon: Algernon Huxley, MD;  Location: [facility] ORS;  Service: Vascular;  Laterality: Left;  left SFA and stent placement   APPLICATION OF WOUND VAC Left 06/07/2015   Procedure: APPLICATION OF WOUND VAC;  Surgeon: Robert Bellow, MD;  Location: [facility] ORS;  Service: General;  Laterality: Left;  left upper back   [place]uch  08/26/2014   Single vessel obstructive CAD   CARPAL TUNNEL RELEASE  04-04-15   [facility]   CATARACT EXTRACTION  07-31-11 and 09-18-11   Catheter ablation  1991   for WPW   cervical fusion     CHOLECYSTECTOMY  09-07-14   ENDARTERECTOMY FEMORAL Left 08/25/2019   Procedure: ENDARTERECTOMY FEMORAL;  Surgeon: Algernon Huxley, MD;  Location: [facility] ORS;  Service: Vascular;  Laterality: Left;  common and produndis    ENDOVASCULAR REPAIR/STENT GRAFT Right 08/25/2019   Procedure: ENDOVASCULAR REPAIR/STENT GRAFT;  Surgeon: Algernon Huxley, MD;  Location: [facility] ORS;  Service: Vascular;  Laterality: Right;  renal artery   HAND  SURGERY     right 1993; left 2005   HERNIA REPAIR  1955   INSERT / REPLACE / REMOVE PACEMAKER     INSERTION OF ILIAC STENT Bilateral 08/25/2019   Procedure: INSERTION OF ILIAC STENT;  Surgeon: Algernon Huxley, MD;  Location: [facility] ORS;  Service: Vascular;  Laterality: Bilateral;   JOINT REPLACEMENT Left 2013   knee   JOINT REPLACEMENT Right 2004   knee   KNEE SURGERY     left knee 1991 and 1992; right knee 1995   LEFT HEART CATHETERIZATION WITH CORONARY ANGIOGRAM N/A 08/26/2014   Procedure: LEFT HEART CATHETERIZATION WITH CORONARY ANGIOGRAM;  Surgeon: [name], MD;  Location: [facility] CATH LAB;  Service: Cardiovascular;  Laterality: N/A;   LOWER EXTREMITY ANGIOGRAPHY Left 08/23/2019   Procedure: Lower Extremity Angiography;  Surgeon: Algernon Huxley, MD;  Location: [place] [facility] CV LAB;  Service: Cardiovascular;  Laterality: Left;   LUMBAR  LAMINECTOMY/DECOMPRESSION MICRODISCECTOMY N/A 06/07/2014   Procedure: LUMBAR FOUR TO FIVE LUMBAR LAMINECTOMY/DECOMPRESSION MICRODISCECTOMY 1 LEVEL;  Surgeon: [name], MD;  Location: [facility] NEURO ORS;  Service: Neurosurgery;  Laterality: N/A;   LUNG BIOPSY Right 2016   Dr [name]   MOHS SURGERY     PACEMAKER INSERTION     PPM-- St Jude 11/30/10 by [name]   PPM GENERATOR CHANGEOUT N/A 07/09/2019   Procedure: PPM GENERATOR CHANGEOUT;  Surgeon: Deboraha Sprang, MD;  Location: [place] CV LAB;  Service: Cardiovascular;  Laterality: N/A;   PROSTATE SURGERY     cancer--1998, prostatectomy   REPLACEMENT TOTAL KNEE     2004   RIGHT/LEFT HEART CATH AND CORONARY ANGIOGRAPHY Bilateral 04/13/2021   Procedure: RIGHT/LEFT HEART CATH AND CORONARY ANGIOGRAPHY;  Surgeon: Wellington Hampshire, MD;  Location: [place] CV LAB;  Service: Cardiovascular;  Laterality: Bilateral;   ruptured disc     1962 and 1998   TEE WITHOUT CARDIOVERSION N/A 09/01/2019   Procedure: TRANSESOPHAGEAL ECHOCARDIOGRAM (TEE);  Surgeon: [name], MD;  Location: [facility] ORS;  Service: Cardiovascular;  Laterality: N/A;   TEMPORARY PACEMAKER N/A 07/09/2019   Procedure: TEMPORARY PACEMAKER;  Surgeon: Deboraha Sprang, MD;  Location: [place] CV LAB;  Service: Cardiovascular;  Laterality: N/A;   TRIGGER FINGER RELEASE  01-24-15   WOUND DEBRIDEMENT Left 06/07/2015   Procedure: DEBRIDEMENT WOUND;  Surgeon: Robert Bellow, MD;  Location: [facility] ORS;  Service: General;  Laterality: Left;  left upper back    Vitals:   10/09/21 1124  BP: 133/76  Pulse: 79  SpO2: 93%     Subjective Assessment - 10/09/21 1130     Subjective Pt reports feeling better after graduating cardiac rehab. He reports continuing to ambulate with his rollator.    Pertinent History Pt is a 87 y.o. male referred to PT for imbalance and strength. Pt reports having a fall exactly one week ago today due to "blacking out" when standing up. Vitals were stable with EMS and  denied need for it. Denies any further falls. Pt reports prior to receiving rollator, he was a furniture walker at home. Has been in cardiac rehab for CHF and has not been walking due to fear of falling because he did not have his rollator yet. Reports being more active since rollator for the last month or so. At home pt has adequate night light set up to assist pt in getting to bathroom in the middle of the night safely. Pt has extensive PMH with low back and cardiac operations with  most recent being a heart catheterization in April. Pt unable to trust himself and is overall scared of falling due to poor balance. Pt's goal is to improve safety with ambulation.    Limitations Sitting;House hold activities;Lifting    How long can you sit comfortably? no issues    How long can you stand comfortably? unsure    How long can you walk comfortably? 1 lap around cardiac rehab loop.    Patient Stated Goals Improve balance and walking             Therex        Ther-Ex FGA tests and measures with supervision needed throughout tests, occasional minA needed with narrow BOS and obstacle negotiation. Education on implications of tests on ADL function *multiple rest breaks taken during FGA test for adequate recovery  2MWT: 295 ft with CGA and rollator with no standing /sitting rest breaks during test  Comfortable gait speed without rollator: 0.65 m/s          PT Long Term Goals - 09/11/21 1240       PT LONG TERM GOAL #1   Title Pt will improve TUG to < 12 sec to decrease risk of falls with household transfers and ambulation tasks.    Baseline 9/20: 16.07 secs    Time 8    Period Weeks    Status New    Target Date 11/06/21      PT LONG TERM GOAL #2   Title Pt will improve 30 sec STS to 11 or greater to indicate clinically significant improvement in LE strength and cardiopulmonary endurance.    Baseline 9/20: 8 reps    Time 8    Period Weeks    Status New    Target Date 11/06/21       PT LONG TERM GOAL #3   Title Pt will improve self selected gait speed to > 0.8 m/s with no AD to demo safe household ambulation speed decreasing risk of falls.    Baseline 9/20: 0.65 m/s    Time 8    Period Weeks    Status New    Target Date 11/06/21      PT LONG TERM GOAL #4   Title Pt will improve FOTO score to target to display perceived improvement with functional mobility.    Baseline 9/20: next session    Time 8    Period Weeks    Status New    Target Date 11/06/21                   Plan - 10/09/21 1221     Clinical Impression Statement PT administered FGA and 2 MWT to assess patient risk for falls and cardiovascular endurance. Based on tests patient is at increased risk for falls and under normative values for distance of ambulation in 2 minutes. Pt continues to demonstrate decreased endurance, abnormal gait, and balance evidenced by testing and need for freqeuent rest breaks. Pt requires CGA- minA for all parts of FGA for safety. Pt will continue to benefit from skilled therapy to achieve set goals and address impairments.    Personal Factors and Comorbidities Age;Comorbidity 3+;Fitness;Past/Current Experience;Time since onset of injury/illness/exacerbation    Comorbidities Multiple spinal surgeries, Arthritis, Diabetes, heart catheterization.    Examination-Activity Limitations Locomotion Level;Stand;Stairs    Examination-Participation Restrictions Cleaning;Community Activity;Laundry;Yard Work    Engineer, water    Rehab Potential Good    PT  Frequency 2x / week    PT Duration 8 weeks    PT Treatment/Interventions ADLs/Self Care Home Management;Canalith Repostioning;Cryotherapy;Moist Heat;DME Instruction;Gait training;Stair training;Functional mobility training;Therapeutic activities;Therapeutic exercise;Balance training;Neuromuscular re-education;Patient/family education;Manual  techniques;Passive range of motion;Dry needling;Vestibular    PT Next Visit Plan create HEP    PT Home Exercise Plan give HEP    Consulted and Agree with Plan of Care Patient;Family member/caregiver             Patient will benefit from skilled therapeutic intervention in order to improve the following deficits and impairments:  Abnormal gait, Decreased knowledge of use of DME, Improper body mechanics, Pain, Impaired sensation, Cardiopulmonary status limiting activity, Postural dysfunction, Decreased activity tolerance, Decreased endurance, Decreased strength, Decreased balance, Difficulty walking, Decreased safety awareness  Visit Diagnosis: History of falling  Difficulty in walking, not elsewhere classified     Problem List Patient Active Problem List   Diagnosis Date Noted   Normocytic anemia 09/18/2021   Other fatigue 54/49/2010   Chronic systolic congestive heart failure ([place]) 07/18/2021   Goals of care, counseling/discussion 04/16/2021   Mediastinal lymphadenopathy 04/16/2021   Heart failure with reduced ejection fraction ([place])    AAA (abdominal aortic aneurysm) without rupture 12/10/2019   Renal artery stenosis ([place]) 12/10/2019   PAD (peripheral artery disease) ([place]) 12/10/2019   Thromboembolism ([place]) 08/30/2019   Atherosclerosis of native arteries of extremity with intermittent claudication ([place]) 08/23/2019   Swelling of limb 08/17/2019   Pain in limb 08/17/2019   NICM (nonischemic cardiomyopathy) ([place]) 07/01/2019   CHF (congestive heart failure) ([place]) 07/01/2019   Malignant neoplasm of right lung ([place]) 04/18/2016   CAD (coronary artery disease) 04/01/2016   Adenocarcinoma ([place]) 04/01/2016   Skin cyst 12/26/2015   GERD (gastroesophageal reflux disease) 06/16/2015   Abscess of back 06/06/2015   Vertigo 03/27/2015   Carpal tunnel syndrome 10/28/2014   Status post cholecystectomy 09/29/2014   Disease of digestive tract 09/29/2014   Cardiomyopathy ([place]) 09/26/2014    Type 2 diabetes mellitus without complications ([place])    Sleep apnea    Spinal stenosis, lumbar region, with neurogenic claudication 06/07/2014   Lumbar stenosis with neurogenic claudication 06/07/2014   Abnormal gait 08/20/2012   H/O total knee replacement 08/20/2012   Arthritis of knee, degenerative 08/20/2012   Pacemaker-St.Jude 08/03/2012   Cardiac conduction disorder 06/19/2012   Acid reflux 06/18/2012   Nodal rhythm disorder 06/18/2012   Triggering of digit 03/25/2012   Hyperlipidemia 12/23/2011   Essential hypertension 03/23/2011   Atrioventricular block, complete ([place]) 03/23/2011   Complete atrioventricular block ([place]) 03/23/2011     [place] DPT Sharion Settler, SPT  Durwin Reges, PT 10/10/2021, 4:22 PM  [facility] [address] [place], [address] Phone: [phone]   Fax:  [phone]  Name: Howard [name] MRN: [id number] Date of Birth: 12-15-1931

## 2021-10-10 ENCOUNTER — Encounter: Payer: Self-pay | Admitting: Physical Therapy

## 2021-10-11 ENCOUNTER — Ambulatory Visit: Payer: Medicare Other | Admitting: Physical Therapy

## 2021-10-11 ENCOUNTER — Encounter: Payer: Medicare Other | Admitting: Physical Therapy

## 2021-10-11 ENCOUNTER — Encounter: Payer: Self-pay | Admitting: Physical Therapy

## 2021-10-11 VITALS — BP 143/100 | HR 81

## 2021-10-11 DIAGNOSIS — R262 Difficulty in walking, not elsewhere classified: Secondary | ICD-10-CM | POA: Diagnosis not present

## 2021-10-11 DIAGNOSIS — Z9181 History of falling: Secondary | ICD-10-CM | POA: Diagnosis not present

## 2021-10-11 NOTE — Therapy (Signed)
[facility] [address] [place], [address] Phone: [phone]   Fax:  [phone]  Physical Therapy Treatment  Patient Details  Name: Howard [name] MRN: [id number] Date of Birth: December 04, 1931 Referring Provider (PT): Earlie Server   Encounter Date: 10/11/2021   PT End of Session - 10/11/21 1245     Visit Number 3    Number of Visits 17    Date for PT Re-Evaluation 11/06/21    PT Start Time 1115    PT Stop Time 1158    PT Time Calculation (min) 43 min    Equipment Utilized During Treatment Gait belt    Activity Tolerance Patient tolerated treatment well    Behavior During Therapy [facility] for tasks assessed/performed             Past Medical History:  Diagnosis Date   Arthritis    Bell palsy    Bell's palsy 04/12/2015   Cancer [facility])    prostate and skin   Chronic combined systolic and diastolic CHF, NYHA class 1 ([place])    a. 07/2014 Echo: EF 35-40%, Gr 1 DD.   Complete heart block ([place])    a. 11/2010 s/p SJM 2210 [employer] DC PPM, ser# [id number].   Depression    Diabetes mellitus without complication ([place])    Fall 11-10-14   GERD (gastroesophageal reflux disease)    History of prostate cancer    Hyperlipidemia    Hypertension    LBBB (left bundle branch block)    Left-sided Bell's palsy    Lung cancer ([place]) 2016   NICM (nonischemic cardiomyopathy) ([place])    a. 07/2014 Echo: EF 35-40%, mid-apicalanteroseptal DK, Gr 1 DD, mild-mod dil LA.   Non-obstructive CAD    a. 07/2014 Abnl MV;  b. 08/2014 Cath: LM nl, LAD 30p, RI 40p, LCX nl, OM1 40, RCA dominant 30p, 70d-->Med Rx.   Poor balance    Presence of permanent cardiac pacemaker    Sleep apnea    a. cpap   Vertigo    WPW (Wolff-Parkinson-White syndrome)    a. S/P RFCA 1991.    Past Surgical History:  Procedure Laterality Date   ABDOMINAL AORTIC ENDOVASCULAR STENT GRAFT  08/25/2019   Procedure: ABDOMINAL AORTIC ENDOVASCULAR STENT GRAFT;  Surgeon: Algernon Huxley, MD;  Location: [facility] ORS;  Service: Vascular;;   ANGIOPLASTY Left 08/25/2019   Procedure: ANGIOPLASTY;  Surgeon: Algernon Huxley, MD;  Location: [facility] ORS;  Service: Vascular;  Laterality: Left;  left SFA and stent placement   APPLICATION OF WOUND VAC Left 06/07/2015   Procedure: APPLICATION OF WOUND VAC;  Surgeon: Robert Bellow, MD;  Location: [facility] ORS;  Service: General;  Laterality: Left;  left upper back   [place]  08/26/2014   Single vessel obstructive CAD   CARPAL TUNNEL RELEASE  04-04-15   [facility]   CATARACT EXTRACTION  07-31-11 and 09-18-11   Catheter ablation  1991   for WPW   cervical fusion     CHOLECYSTECTOMY  09-07-14   ENDARTERECTOMY FEMORAL Left 08/25/2019   Procedure: ENDARTERECTOMY FEMORAL;  Surgeon: Algernon Huxley, MD;  Location: [facility] ORS;  Service: Vascular;  Laterality: Left;  common and produndis    ENDOVASCULAR REPAIR/STENT GRAFT Right 08/25/2019   Procedure: ENDOVASCULAR REPAIR/STENT GRAFT;  Surgeon: Algernon Huxley, MD;  Location: [facility] ORS;  Service: Vascular;  Laterality: Right;  renal artery   HAND  SURGERY     right 1993; left 2005   HERNIA REPAIR  1955   INSERT / REPLACE / REMOVE PACEMAKER     INSERTION OF ILIAC STENT Bilateral 08/25/2019   Procedure: INSERTION OF ILIAC STENT;  Surgeon: Algernon Huxley, MD;  Location: [facility] ORS;  Service: Vascular;  Laterality: Bilateral;   JOINT REPLACEMENT Left 2013   knee   JOINT REPLACEMENT Right 2004   knee   KNEE SURGERY     left knee 1991 and 1992; right knee 1995   LEFT HEART CATHETERIZATION WITH CORONARY ANGIOGRAM N/A 08/26/2014   Procedure: LEFT HEART CATHETERIZATION WITH CORONARY ANGIOGRAM;  Surgeon: [name], MD;  Location: [facility] CATH LAB;  Service: Cardiovascular;  Laterality: N/A;   LOWER EXTREMITY ANGIOGRAPHY Left 08/23/2019   Procedure: Lower Extremity Angiography;  Surgeon: Algernon Huxley, MD;  Location: [place] CV LAB;  Service: Cardiovascular;  Laterality: Left;   LUMBAR  LAMINECTOMY/DECOMPRESSION MICRODISCECTOMY N/A 06/07/2014   Procedure: LUMBAR FOUR TO FIVE LUMBAR LAMINECTOMY/DECOMPRESSION MICRODISCECTOMY 1 LEVEL;  Surgeon: [name], MD;  Location: [place] NEURO ORS;  Service: Neurosurgery;  Laterality: N/A;   LUNG BIOPSY Right 2016   Dr [name]   MOHS SURGERY     PACEMAKER INSERTION     PPM-- St Jude 11/30/10 by [name]   PPM GENERATOR CHANGEOUT N/A 07/09/2019   Procedure: PPM GENERATOR CHANGEOUT;  Surgeon: Deboraha Sprang, MD;  Location: [place] CV LAB;  Service: Cardiovascular;  Laterality: N/A;   PROSTATE SURGERY     cancer--1998, prostatectomy   REPLACEMENT TOTAL KNEE     2004   RIGHT/LEFT HEART CATH AND CORONARY ANGIOGRAPHY Bilateral 04/13/2021   Procedure: RIGHT/LEFT HEART CATH AND CORONARY ANGIOGRAPHY;  Surgeon: Wellington Hampshire, MD;  Location: [place] CV LAB;  Service: Cardiovascular;  Laterality: Bilateral;   ruptured disc     1962 and 1998   TEE WITHOUT CARDIOVERSION N/A 09/01/2019   Procedure: TRANSESOPHAGEAL ECHOCARDIOGRAM (TEE);  Surgeon: [name], MD;  Location: [facility] ORS;  Service: Cardiovascular;  Laterality: N/A;   TEMPORARY PACEMAKER N/A 07/09/2019   Procedure: TEMPORARY PACEMAKER;  Surgeon: Deboraha Sprang, MD;  Location: [place] CV LAB;  Service: Cardiovascular;  Laterality: N/A;   TRIGGER FINGER RELEASE  01-24-15   WOUND DEBRIDEMENT Left 06/07/2015   Procedure: DEBRIDEMENT WOUND;  Surgeon: Robert Bellow, MD;  Location: [facility] ORS;  Service: General;  Laterality: Left;  left upper back    Vitals:   10/11/21 1120  BP: (!) 143/100  Pulse: 81     Subjective Assessment - 10/11/21 1122     Subjective Pt reports feeling well this visit. He denies any falls or medical changes.    Pertinent History Pt is a 87 y.o. male referred to PT for imbalance and strength. Pt reports having a fall exactly one week ago today due to "blacking out" when standing up. Vitals were stable with EMS and denied need for it. Denies any further falls.  Pt reports prior to receiving rollator, he was a furniture walker at home. Has been in cardiac rehab for CHF and has not been walking due to fear of falling because he did not have his rollator yet. Reports being more active since rollator for the last month or so. At home pt has adequate night light set up to assist pt in getting to bathroom in the middle of the night safely. Pt has extensive PMH with low back and cardiac operations with most recent being a heart  catheterization in April. Pt unable to trust himself and is overall scared of falling due to poor balance. Pt's goal is to improve safety with ambulation.    Limitations Sitting;House hold activities;Lifting    How long can you sit comfortably? no issues    How long can you stand comfortably? unsure    How long can you walk comfortably? 1 lap around cardiac rehab loop.    Patient Stated Goals Improve balance and walking            Therex   Nu Step L3 x 5 min Seat 9 for gentle LE strengthening and cardiovascular endurance   Alternating toe tap onto treadmill 8" step 2 x 10 reps each foot   Sit to Stands 2 x 6 reps with light single UE support for success in transfer   AMB 200 ft CGA without rollator with increase perceived exertion and SP02 sat drop to 88% with recovery above 90% in 45 sec following seated rest; emphasis on reciprocal arm swing and heel strike and toe off during gait training  *frequent rest breaks given for adequate recovery               PT Education - 10/11/21 1243     Education Details pt educated on HEP sets/reps/frequency    Person(s) Educated Patient    Methods Explanation;Demonstration    Comprehension Verbalized understanding;Returned demonstration                 PT Long Term Goals - 09/11/21 1240       PT LONG TERM GOAL #1   Title Pt will improve TUG to < 12 sec to decrease risk of falls with household transfers and ambulation tasks.    Baseline 9/20: 16.07 secs    Time 8     Period Weeks    Status New    Target Date 11/06/21      PT LONG TERM GOAL #2   Title Pt will improve 30 sec STS to 11 or greater to indicate clinically significant improvement in LE strength and cardiopulmonary endurance.    Baseline 9/20: 8 reps    Time 8    Period Weeks    Status New    Target Date 11/06/21      PT LONG TERM GOAL #3   Title Pt will improve self selected gait speed to > 0.8 m/s with no AD to demo safe household ambulation speed decreasing risk of falls.    Baseline 9/20: 0.65 m/s    Time 8    Period Weeks    Status New    Target Date 11/06/21      PT LONG TERM GOAL #4   Title Pt will improve FOTO score to target to display perceived improvement with functional mobility.    Baseline 9/20: next session    Time 8    Period Weeks    Status New    Target Date 11/06/21                   Plan - 10/11/21 1246     Clinical Impression Statement PT initiated LE strength, balance, endurance training this session. Pt continues to require frequent rest breaks between sets. PT educated patient on parameters of HEP frequency, rep/set range, and purpose with verbalized understanding. Pt required minimal verbal and tactile cueing to correct and perform exercises. Pt will continue to benefit from skilled PT to address impairment and achieve set goals.    Personal Factors  and Comorbidities Age;Comorbidity 3+;Fitness;Past/Current Experience;Time since onset of injury/illness/exacerbation    Comorbidities Multiple spinal surgeries, Arthritis, Diabetes, heart catheterization.    Examination-Activity Limitations Locomotion Level;Stand;Stairs    Examination-Participation Restrictions Cleaning;Community Activity;Laundry;Yard Work    Biomedical scientist High    Rehab Potential Good    PT Frequency 2x / week    PT Duration 8 weeks    PT Treatment/Interventions ADLs/Self Care Home Management;Canalith  Repostioning;Cryotherapy;Moist Heat;DME Instruction;Gait training;Stair training;Functional mobility training;Therapeutic activities;Therapeutic exercise;Balance training;Neuromuscular re-education;Patient/family education;Manual techniques;Passive range of motion;Dry needling;Vestibular    PT Next Visit Plan Review HEP    PT Home Exercise Plan Sit to Stands, Marching/toe tapping    Consulted and Agree with Plan of Care Patient;Family member/caregiver             Patient will benefit from skilled therapeutic intervention in order to improve the following deficits and impairments:  Abnormal gait, Decreased knowledge of use of DME, Improper body mechanics, Pain, Impaired sensation, Cardiopulmonary status limiting activity, Postural dysfunction, Decreased activity tolerance, Decreased endurance, Decreased strength, Decreased balance, Difficulty walking, Decreased safety awareness  Visit Diagnosis: History of falling  Difficulty in walking, not elsewhere classified     Problem List Patient Active Problem List   Diagnosis Date Noted   Normocytic anemia 09/18/2021   Other fatigue 53/74/8270   Chronic systolic congestive heart failure ([place]) 07/18/2021   Goals of care, counseling/discussion 04/16/2021   Mediastinal lymphadenopathy 04/16/2021   Heart failure with reduced ejection fraction ([place])    AAA (abdominal aortic aneurysm) without rupture 12/10/2019   Renal artery stenosis ([place]) 12/10/2019   PAD (peripheral artery disease) ([place]) 12/10/2019   Thromboembolism ([place]) 08/30/2019   Atherosclerosis of native arteries of extremity with intermittent claudication ([place]) 08/23/2019   Swelling of limb 08/17/2019   Pain in limb 08/17/2019   NICM (nonischemic cardiomyopathy) ([place]) 07/01/2019   CHF (congestive heart failure) ([place]) 07/01/2019   Malignant neoplasm of right lung ([place]) 04/18/2016   CAD (coronary artery disease) 04/01/2016   Adenocarcinoma ([place]) 04/01/2016   Skin cyst 12/26/2015    GERD (gastroesophageal reflux disease) 06/16/2015   Abscess of back 06/06/2015   Vertigo 03/27/2015   Carpal tunnel syndrome 10/28/2014   Status post cholecystectomy 09/29/2014   Disease of digestive tract 09/29/2014   Cardiomyopathy ([place]) 09/26/2014   Type 2 diabetes mellitus without complications ([place])    Sleep apnea    Spinal stenosis, lumbar region, with neurogenic claudication 06/07/2014   Lumbar stenosis with neurogenic claudication 06/07/2014   Abnormal gait 08/20/2012   H/O total knee replacement 08/20/2012   Arthritis of knee, degenerative 08/20/2012   Pacemaker-St.Jude 08/03/2012   Cardiac conduction disorder 06/19/2012   Acid reflux 06/18/2012   Nodal rhythm disorder 06/18/2012   Triggering of digit 03/25/2012   Hyperlipidemia 12/23/2011   Essential hypertension 03/23/2011   Atrioventricular block, complete ([place]) 03/23/2011   Complete atrioventricular block ([place]) 03/23/2011    Chelsea Peterson DPT Sharion Settler, SPT  Durwin Reges, PT 10/12/2021, 9:42 AM  [facility] [address], [place], [address] Phone: [phone]   Fax:  [phone]  Name: Howard [name] MRN: [id number] Date of Birth: 1931/06/26

## 2021-10-12 DIAGNOSIS — H903 Sensorineural hearing loss, bilateral: Secondary | ICD-10-CM | POA: Diagnosis not present

## 2021-10-12 NOTE — Progress Notes (Signed)
Remote pacemaker transmission.   

## 2021-10-16 ENCOUNTER — Telehealth: Payer: Self-pay | Admitting: Cardiovascular Disease

## 2021-10-16 ENCOUNTER — Ambulatory Visit: Payer: Medicare Other | Admitting: Physical Therapy

## 2021-10-16 ENCOUNTER — Telehealth: Payer: Self-pay | Admitting: Pulmonary Disease

## 2021-10-16 NOTE — Telephone Encounter (Signed)
Pt c/o of Chest Pain: STAT if CP now or developed within 24 hours  1. Are you having CP right now? No, last night. Not every night, but last night was not the 1st time. 2. Are you experiencing any other symptoms (ex. SOB, nausea, vomiting, sweating)? Some SOB, currently being treated for lung cancer.   3. How long have you been experiencing CP? About a month  4. Is your CP continuous or coming and going? Comes and goes  5. Have you taken Nitroglycerin? no ?

## 2021-10-16 NOTE — Telephone Encounter (Signed)
Spoke with pt's wife, Howard [name] (DPR approved).  Pt has had chest pain "off and on for 1-2 months and has not told anyone." Pt had episode last night that lasted ~30 min after pt laid down in bed.  Pt also had some shortness of breath as well, wife states that SOB with minimal exertion is common with current tx of lung cancer. Pt does wear oxygen as needed and at night.   Per wife, pt's chest pain episodes over past 1-2 months have usually occurred after pt lays down for bed.  Last night pt got up and sat in recliner with oxygen and chest pain/sob subsided gradually after sitting up.  Pt does also have h/o GERD. Takes Omeprazole 40 mg BID.  Wife states pt takes evening dose at 9 PM. Eats dinner at 6 pm and has been going to bed at 8 PM.  Advised pt starts taking evening omeprazole 30 minutes before eating dinner to r/o GERD causing episodes after lying down.   Pt denies chest pain or sob now at time of call. Pt feels stable and "feels ok" per wife.  BP/HR at time of call 121/71 93 Pt does continue current medications per med list including Imdur. Pt does not take Nitroglycerin PRN.  Advised pt continue to monitor s/s not to r/o cardiac cause.  Advised to let us know if recurrent symptoms and to call 911 if chest pain returns and does not resolve and/or pt feels unstable.  Pt has follow up scheduled with Dr. [name] 10/30/21 and will follow up otherwise.  Notified pt's wife that I will make Dr. [name] aware for any further recc.

## 2021-10-16 NOTE — Telephone Encounter (Signed)
Received epic secure message from Dr. Patsey Berthold to scheduled next available f/u.   Lm for patient.

## 2021-10-17 ENCOUNTER — Ambulatory Visit
Admission: RE | Admit: 2021-10-17 | Discharge: 2021-10-17 | Disposition: A | Discharge status: Home / Self Care | Payer: Medicare Other | Source: Ambulatory Visit | Attending: Radiation Oncology | Admitting: Radiation Oncology

## 2021-10-17 ENCOUNTER — Other Ambulatory Visit: Payer: Self-pay

## 2021-10-17 ENCOUNTER — Encounter: Payer: Self-pay | Admitting: Radiation Oncology

## 2021-10-17 VITALS — BP 122/66 | HR 81 | Temp 95.1°F | Resp 20 | Wt 184.5 lb

## 2021-10-17 DIAGNOSIS — Z923 Personal history of irradiation: Secondary | ICD-10-CM | POA: Insufficient documentation

## 2021-10-17 DIAGNOSIS — C3411 Malignant neoplasm of upper lobe, right bronchus or lung: Secondary | ICD-10-CM | POA: Diagnosis not present

## 2021-10-17 DIAGNOSIS — Z08 Encounter for follow-up examination after completed treatment for malignant neoplasm: Secondary | ICD-10-CM | POA: Diagnosis not present

## 2021-10-17 DIAGNOSIS — C3431 Malignant neoplasm of lower lobe, right bronchus or lung: Secondary | ICD-10-CM | POA: Insufficient documentation

## 2021-10-17 DIAGNOSIS — C3491 Malignant neoplasm of unspecified part of right bronchus or lung: Secondary | ICD-10-CM

## 2021-10-17 DIAGNOSIS — F17210 Nicotine dependence, cigarettes, uncomplicated: Secondary | ICD-10-CM | POA: Diagnosis not present

## 2021-10-17 NOTE — Progress Notes (Signed)
Radiation Oncology Follow up Note  Name: Howard [name]   Date:   10/17/2021 MRN:  [id number] DOB: [birth year]    This 87 y.o. male presents to the clinic today for 56-month follow-up status post radiation therapy to his right lower lobe and right hilar region for non-small cell lung cancer.  REFERRING PROVIDER: Margo Common, PA-C  HPI: Patient is an 87 year old male now out 4 months having completed radiation therapy to his right lower lobe and right hilar region for progressive non-small cell lung cancer.  From a pulmonary standpoint he is doing well specifically [place] cough hemoptysis chest tightness.  Major complaint is his lack of energy and possibly some depressive component..  He had a recent CT scan showing dense from fibrotic consolidation in the region of the right lower lobe compatible with radiation changes.  The nodule in that area appears diminished in size with interval resolution of previously noted right hilar subcarinal and pretracheal lymphadenopathy.  These findings were consistent with treatment response.  COMPLICATIONS OF TREATMENT: none  FOLLOW UP COMPLIANCE: keeps appointments   PHYSICAL EXAM:  BP 122/66   Pulse 81   Temp (!) 95.1 F (35.1 C) (Tympanic)   Resp 20   Wt 184 lb 8 oz (83.7 kg)   BMI 27.25 kg/m  Somewhat frail-appearing male wheelchair-bound in NAD.  Well-developed well-nourished patient in NAD. HEENT reveals PERLA, EOMI, discs not visualized.  Oral cavity is clear. No oral mucosal lesions are identified. Neck is clear without evidence of cervical or supraclavicular adenopathy. Lungs are clear to A&P. Cardiac examination is essentially unremarkable with regular rate and rhythm without murmur rub or thrill. Abdomen is benign with no organomegaly or masses noted. Motor sensory and DTR levels are equal and symmetric in the upper and lower extremities. Cranial nerves II through XII are grossly intact. Proprioception is intact. No peripheral adenopathy or  edema is identified. No motor or sensory levels are noted. Crude visual fields are within normal range.  RADIOLOGY RESULTS: CT scans reviewed compatible with above-stated findings  PLAN: Present time from a clinical standpoint he is doing well with seems to have excellent response to his radiation therapy treatments.  He has had minimal side effect profile.  Again discussing with his wife his activity schedule there may be a component of depression involved.  He is seeing a new PMD in the next several weeks and that will be brought up at that time he may benefit from antidepressive medication.  I have also instructed him he needs to start some type of exercise and he is working with physical therapy on that.  I have asked to see him back in 6 months for follow-up.  Patient knows to call with any concerns.  I would like to take this opportunity to thank you for allowing me to participate in the care of your patient.Noreene Filbert, MD

## 2021-10-17 NOTE — Telephone Encounter (Signed)
Appt scheduled for 11/27/2021 at 10:00. Nothing further needed at this time.

## 2021-10-19 ENCOUNTER — Ambulatory Visit: Payer: Medicare Other | Admitting: Physical Therapy

## 2021-10-19 ENCOUNTER — Encounter: Payer: Self-pay | Admitting: Physical Therapy

## 2021-10-19 ENCOUNTER — Other Ambulatory Visit: Payer: Self-pay

## 2021-10-19 VITALS — BP 140/94 | HR 88

## 2021-10-19 DIAGNOSIS — Z9181 History of falling: Secondary | ICD-10-CM | POA: Diagnosis not present

## 2021-10-19 DIAGNOSIS — R262 Difficulty in walking, not elsewhere classified: Secondary | ICD-10-CM | POA: Diagnosis not present

## 2021-10-19 NOTE — Therapy (Signed)
[facility] [address] [place], [address] Phone: [phone]   Fax:  [phone]  Physical Therapy Treatment  Patient Details  Name: Howard [name] MRN: [id number] Date of Birth: [birth year] Referring Provider (PT): Earlie Server   Encounter Date: 10/19/2021   PT End of Session - 10/19/21 0845     Visit Number 4    Number of Visits 17    Date for PT Re-Evaluation 11/06/21    Authorization - Visit Number 4    Progress Note Due on Visit 10    PT Start Time 0835    PT Stop Time 0917    PT Time Calculation (min) 42 min    Equipment Utilized During Treatment Gait belt    Activity Tolerance Patient tolerated treatment well    Behavior During Therapy [facility] for tasks assessed/performed             Past Medical History:  Diagnosis Date   Arthritis    Bell palsy    Bell's palsy 04/12/2015   Cancer [facility])    prostate and skin   Chronic combined systolic and diastolic CHF, NYHA class 1 ([place])    a. 07/2014 Echo: EF 35-40%, Gr 1 DD.   Complete heart block ([place])    a. 11/2010 s/p SJM 2210 [employer] DC PPM, ser# [id number].   Depression    Diabetes mellitus without complication ([place])    Fall 11-10-14   GERD (gastroesophageal reflux disease)    History of prostate cancer    Hyperlipidemia    Hypertension    LBBB (left bundle branch block)    Left-sided Bell's palsy    Lung cancer ([place]) 2016   NICM (nonischemic cardiomyopathy) ([place])    a. 07/2014 Echo: EF 35-40%, mid-apicalanteroseptal DK, Gr 1 DD, mild-mod dil LA.   Non-obstructive CAD    a. 07/2014 Abnl MV;  b. 08/2014 Cath: LM nl, LAD 30p, RI 40p, LCX nl, OM1 40, RCA dominant 30p, 70d-->Med Rx.   Poor balance    Presence of permanent cardiac pacemaker    Sleep apnea    a. cpap   Vertigo    WPW (Wolff-Parkinson-White syndrome)    a. S/P RFCA 1991.    Past Surgical History:  Procedure Laterality Date   ABDOMINAL AORTIC ENDOVASCULAR STENT GRAFT  08/25/2019    Procedure: ABDOMINAL AORTIC ENDOVASCULAR STENT GRAFT;  Surgeon: Algernon Huxley, MD;  Location: [facility] ORS;  Service: Vascular;;   ANGIOPLASTY Left 08/25/2019   Procedure: ANGIOPLASTY;  Surgeon: Algernon Huxley, MD;  Location: [facility] ORS;  Service: Vascular;  Laterality: Left;  left SFA and stent placement   APPLICATION OF WOUND VAC Left 06/07/2015   Procedure: APPLICATION OF WOUND VAC;  Surgeon: Robert Bellow, MD;  Location: [facility] ORS;  Service: General;  Laterality: Left;  left upper back   [place]  08/26/2014   Single vessel obstructive CAD   CARPAL TUNNEL RELEASE  04-04-15   [facility]   CATARACT EXTRACTION  07-31-11 and 09-18-11   Catheter ablation  1991   for WPW   cervical fusion     CHOLECYSTECTOMY  09-07-14   ENDARTERECTOMY FEMORAL Left 08/25/2019   Procedure: ENDARTERECTOMY FEMORAL;  Surgeon: Algernon Huxley, MD;  Location: [facility] ORS;  Service: Vascular;  Laterality: Left;  common and produndis    ENDOVASCULAR REPAIR/STENT GRAFT Right 08/25/2019   Procedure: ENDOVASCULAR REPAIR/STENT GRAFT;  Surgeon: Algernon Huxley,  MD;  Location: [facility] ORS;  Service: Vascular;  Laterality: Right;  renal artery   HAND SURGERY     right 1993; left 2005   [place] / REPLACE / REMOVE PACEMAKER     INSERTION OF ILIAC STENT Bilateral 08/25/2019   Procedure: INSERTION OF ILIAC STENT;  Surgeon: Algernon Huxley, MD;  Location: [facility] ORS;  Service: Vascular;  Laterality: Bilateral;   JOINT REPLACEMENT Left 2013   knee   JOINT REPLACEMENT Right 2004   knee   KNEE SURGERY     left knee 1991 and 1992; right knee [place] N/A 08/26/2014   Procedure: LEFT HEART CATHETERIZATION WITH CORONARY ANGIOGRAM;  Surgeon: [name], MD;  Location[facility] CATH LAB;  Service: Cardiovascular;  Laterality: N/A;   LOWER EXTREMITY ANGIOGRAPHY Left 08/23/2019   Procedure: Lower Extremity Angiography;  Surgeon: Algernon Huxley, MD;  Location: [facility]  CV LAB;  Service: Cardiovascular;  Laterality: Left;   LUMBAR LAMINECTOMY/DECOMPRESSION MICRODISCECTOMY N/A 06/07/2014   Procedure: LUMBAR FOUR TO FIVE LUMBAR LAMINECTOMY/DECOMPRESSION MICRODISCECTOMY 1 LEVEL;  Surgeon: [name], MD;  Location: [facility] NEURO ORS;  Service: Neurosurgery;  Laterality: N/A;   LUNG BIOPSY Right 2016   Dr [name]   MOHS SURGERY     PACEMAKER INSERTION     PPM-- St Jude 11/30/10 by [name]   PPM GENERATOR CHANGEOUT N/A 07/09/2019   Procedure: PPM GENERATOR CHANGEOUT;  Surgeon: Deboraha Sprang, MD;  Location: [facility] CV LAB;  Service: Cardiovascular;  Laterality: N/A;   PROSTATE SURGERY     cancer--1998, prostatectomy   REPLACEMENT TOTAL KNEE     2004   RIGHT/LEFT HEART CATH AND CORONARY ANGIOGRAPHY Bilateral 04/13/2021   Procedure: RIGHT/LEFT HEART CATH AND CORONARY ANGIOGRAPHY;  Surgeon: Wellington Hampshire, MD;  Location: [place] CV LAB;  Service: Cardiovascular;  Laterality: Bilateral;   ruptured disc     1962 and 1998   TEE WITHOUT CARDIOVERSION N/A 09/01/2019   Procedure: TRANSESOPHAGEAL ECHOCARDIOGRAM (TEE);  Surgeon: [name], MD;  Location: [facility] ORS;  Service: Cardiovascular;  Laterality: N/A;   TEMPORARY PACEMAKER N/A 07/09/2019   Procedure: TEMPORARY PACEMAKER;  Surgeon: Deboraha Sprang, MD;  Location: [facility] [place] CV LAB;  Service: Cardiovascular;  Laterality: N/A;   TRIGGER FINGER RELEASE  01-24-15   WOUND DEBRIDEMENT Left 06/07/2015   Procedure: DEBRIDEMENT WOUND;  Surgeon: Robert Bellow, MD;  Location: [facility] ORS;  Service: General;  Laterality: Left;  left upper back    Vitals:   10/19/21 0849  BP: (!) 140/94  Pulse: 88  SpO2: 98%     Subjective Assessment - 10/19/21 0836     Subjective Pt has no complaints of chest pain this visit. He reports having had "reflux" that is now controlled with medication.    Pertinent History Pt is a 87 y.o. male referred to PT for imbalance and strength. Pt reports having a fall exactly one week ago today  due to "blacking out" when standing up. Vitals were stable with EMS and denied need for it. Denies any further falls. Pt reports prior to receiving rollator, he was a furniture walker at home. Has been in cardiac rehab for CHF and has not been walking due to fear of falling because he did not have his rollator yet. Reports being more active since rollator for the last month or so. At home pt has adequate night light set up to assist pt  in getting to bathroom in the middle of the night safely. Pt has extensive PMH with low back and cardiac operations with most recent being a heart catheterization in April. Pt unable to trust himself and is overall scared of falling due to poor balance. Pt's goal is to improve safety with ambulation.    Limitations Sitting;House hold activities;Lifting    How long can you sit comfortably? no issues    How long can you stand comfortably? unsure    How long can you walk comfortably? 1 lap around cardiac rehab loop.    Patient Stated Goals Improve balance and walking             Therex    Nu Step L3 x 5 min Seat 9 for gentle LE strengthening and cardiovascular endurance   Sit to Stands 2 x 8 reps with light single UE support for success in transfer; cues to use limit use of momentum  Hurdle step over onto airex 1 x 6 reps each LE with CGA    AMB 200 ft CGA without rollator with increase perceived exertion and SP02 sat drop no lower than 90% emphasis on reciprocal arm swing and heel strike and toe off during gait training; 3 hurdles placed to negotiate with difficulty clearing   Alternating toe tap onto treadmill 8" step 2 x 10 reps each foot with single UE support   *frequent rest breaks given for adequate recovery                           PT Education - 10/19/21 0923     Education Details therex form/technique with reviewed HEP    Person(s) Educated Patient    Methods Explanation;Demonstration    Comprehension Verbalized  understanding;Returned demonstration                 PT Long Term Goals - 09/11/21 1240       PT LONG TERM GOAL #1   Title Pt will improve TUG to < 12 sec to decrease risk of falls with household transfers and ambulation tasks.    Baseline 9/20: 16.07 secs    Time 8    Period Weeks    Status New    Target Date 11/06/21      PT LONG TERM GOAL #2   Title Pt will improve 30 sec STS to 11 or greater to indicate clinically significant improvement in LE strength and cardiopulmonary endurance.    Baseline 9/20: 8 reps    Time 8    Period Weeks    Status New    Target Date 11/06/21      PT LONG TERM GOAL #3   Title Pt will improve self selected gait speed to > 0.8 m/s with no AD to demo safe household ambulation speed decreasing risk of falls.    Baseline 9/20: 0.65 m/s    Time 8    Period Weeks    Status New    Target Date 11/06/21      PT LONG TERM GOAL #4   Title Pt will improve FOTO score to target to display perceived improvement with functional mobility.    Baseline 9/20: next session    Time 8    Period Weeks    Status New    Target Date 11/06/21                   Plan - 10/19/21 6222  Clinical Impression Statement PT continued LE strength, balance, endurance training this session. Pt continues to require frequent rest breaks between sets. Pt required CGA-minA to correct balance when negotiating hurdles. Pt will continue to benefit from skilled PT to address impairment and achieve set goals.    Personal Factors and Comorbidities Age;Comorbidity 3+;Fitness;Past/Current Experience;Time since onset of injury/illness/exacerbation    Comorbidities Multiple spinal surgeries, Arthritis, Diabetes, heart catheterization.    Examination-Activity Limitations Locomotion Level;Stand;Stairs    Examination-Participation Restrictions Cleaning;Community Activity;Laundry;Yard Work    Librarian, academic High    Rehab Potential Good    PT Frequency 2x / week    PT Duration 8 weeks    PT Treatment/Interventions ADLs/Self Care Home Management;Canalith Repostioning;Cryotherapy;Moist Heat;DME Instruction;Gait training;Stair training;Functional mobility training;Therapeutic activities;Therapeutic exercise;Balance training;Neuromuscular re-education;Patient/family education;Manual techniques;Passive range of motion;Dry needling;Vestibular    PT Next Visit Plan Review HEP    PT Home Exercise Plan Sit to Stands, Marching/toe tapping    Consulted and Agree with Plan of Care Patient;Family member/caregiver             Patient will benefit from skilled therapeutic intervention in order to improve the following deficits and impairments:  Abnormal gait, Decreased knowledge of use of DME, Improper body mechanics, Pain, Impaired sensation, Cardiopulmonary status limiting activity, Postural dysfunction, Decreased activity tolerance, Decreased endurance, Decreased strength, Decreased balance, Difficulty walking, Decreased safety awareness  Visit Diagnosis: History of falling     Problem List Patient Active Problem List   Diagnosis Date Noted   Normocytic anemia 09/18/2021   Other fatigue 17/51/0258   Chronic systolic congestive heart failure ([place]) 07/18/2021   Goals of care, counseling/discussion 04/16/2021   Mediastinal lymphadenopathy 04/16/2021   Heart failure with reduced ejection fraction ([place])    AAA (abdominal aortic aneurysm) without rupture 12/10/2019   Renal artery stenosis ([place]) 12/10/2019   PAD (peripheral artery disease) ([place]) 12/10/2019   Thromboembolism ([place]) 08/30/2019   Atherosclerosis of native arteries of extremity with intermittent claudication ([place]) 08/23/2019   Swelling of limb 08/17/2019   Pain in limb 08/17/2019   NICM (nonischemic cardiomyopathy) ([place]) 07/01/2019   CHF (congestive heart failure) ([place]) 07/01/2019   Malignant neoplasm of right lung ([place])  04/18/2016   CAD (coronary artery disease) 04/01/2016   Adenocarcinoma ([place]) 04/01/2016   Skin cyst 12/26/2015   GERD (gastroesophageal reflux disease) 06/16/2015   Abscess of back 06/06/2015   Vertigo 03/27/2015   Carpal tunnel syndrome 10/28/2014   Status post cholecystectomy 09/29/2014   Disease of digestive tract 09/29/2014   Cardiomyopathy ([place]) 09/26/2014   Type 2 diabetes mellitus without complications ([place])    Sleep apnea    Spinal stenosis, lumbar region, with neurogenic claudication 06/07/2014   Lumbar stenosis with neurogenic claudication 06/07/2014   Abnormal gait 08/20/2012   H/O total knee replacement 08/20/2012   Arthritis of knee, degenerative 08/20/2012   Pacemaker-St.Jude 08/03/2012   Cardiac conduction disorder 06/19/2012   Acid reflux 06/18/2012   Nodal rhythm disorder 06/18/2012   Triggering of digit 03/25/2012   Hyperlipidemia 12/23/2011   Essential hypertension 03/23/2011   Atrioventricular block, complete ([place]) 03/23/2011   Complete atrioventricular block ([place]) 03/23/2011     Chelsea Peterson DPT Sharion Settler, SPT  Durwin Reges, PT 10/19/2021, 11:43 AM  [facility] [address] [place], [address] Phone: [phone]   Fax:  [phone]  Name: Howard [name] MRN: [id number] Date of Birth: Jun 28, 1931

## 2021-10-20 ENCOUNTER — Other Ambulatory Visit: Payer: Self-pay | Admitting: Family Medicine

## 2021-10-20 DIAGNOSIS — E119 Type 2 diabetes mellitus without complications: Secondary | ICD-10-CM

## 2021-10-21 ENCOUNTER — Encounter: Payer: Self-pay | Admitting: Family Medicine

## 2021-10-21 NOTE — Telephone Encounter (Signed)
Requested medication (s) are due for refill today: yes  Requested medication (s) are on the active medication list: yes  Last refill:  yes  Future visit scheduled: 09/24/21 #60  Notes to clinic:  overdue Hgb A1C   Requested Prescriptions  Pending Prescriptions Disp Refills   metFORMIN (GLUCOPHAGE) 500 MG tablet 60 tablet 0    Sig: Take 1 tablet (500 mg total) by mouth 2 (two) times daily with a meal.     Endocrinology:  Diabetes - Biguanides Failed - 10/20/2021  7:00 PM      Failed - Cr in normal range and within 360 days    Creat  Date Value Ref Range Status  09/30/2017 0.81 0.70 - 1.11 mg/dL Final    Comment:    For patients >23 years of age, the reference limit for Creatinine is approximately 13% higher for people identified as African-American. .    Creatinine, Ser  Date Value Ref Range Status  09/18/2021 1.27 (H) 0.61 - 1.24 mg/dL Final   Creatinine, POC  Date Value Ref Range Status  09/30/2017 NA mg/dL Final          Failed - HBA1C is between 0 and 7.9 and within 180 days    Hemoglobin A1C  Date Value Ref Range Status  11/09/2020 7.7 (A) 4.0 - 5.6 % Final   Hgb A1c MFr Bld  Date Value Ref Range Status  05/08/2020 7.4 (H) 4.8 - 5.6 % Final    Comment:             Prediabetes: 5.7 - 6.4          Diabetes: >6.4          Glycemic control for adults with diabetes: <7.0           Failed - eGFR in normal range and within 360 days    GFR, Est African American  Date Value Ref Range Status  09/30/2017 95 > OR = 60 mL/min/1.58m2 Final   GFR calc Af Amer  Date Value Ref Range Status  11/13/2020 60 >59 mL/min/1.73 Final    Comment:    **In accordance with recommendations from the NKF-ASN Task force,**   [facility] is in the process of updating its eGFR calculation to the   2021 CKD-EPI creatinine equation that estimates kidney function   without a race variable.    GFR, Est Non African American  Date Value Ref Range Status  09/30/2017 82 > OR = 60  mL/min/1.79m2 Final   GFR, Estimated  Date Value Ref Range Status  09/18/2021 54 (L) >60 mL/min Final    Comment:    (NOTE) Calculated using the CKD-EPI Creatinine Equation (2021)    GFR  Date Value Ref Range Status  08/24/2014 80.80 >60.00 mL/min Final          Failed - Valid encounter within last 6 months    Recent Outpatient Visits           8 months ago Chest pain, unspecified type   [place], [place], PA-C   9 months ago Memory loss   [place], PA-C   11 months ago Type 2 diabetes mellitus with diabetic neuropathy, unspecified whether long term insulin use [facility])   [employer], [name], PA-C   1 year ago Shortness of breath   [facility] [place] Birdie Sons, MD   1 year ago Abnormal gait due to peripheral sensory disorder   [facility] [place], [name], PA-C       Future Appointments             In 2 days Jerrol Banana., MD [facility], [place]   In 1 week [name], Kathlene November, MD [facility], [place]

## 2021-10-21 NOTE — Telephone Encounter (Deleted)
This encounter was created in error - please disregard.

## 2021-10-22 ENCOUNTER — Encounter: Payer: Self-pay | Admitting: Physical Therapy

## 2021-10-22 ENCOUNTER — Ambulatory Visit: Payer: Medicare Other | Admitting: Physical Therapy

## 2021-10-22 VITALS — BP 122/57 | HR 81

## 2021-10-22 DIAGNOSIS — I5023 Acute on chronic systolic (congestive) heart failure: Secondary | ICD-10-CM | POA: Diagnosis not present

## 2021-10-22 DIAGNOSIS — I110 Hypertensive heart disease with heart failure: Secondary | ICD-10-CM | POA: Diagnosis not present

## 2021-10-22 DIAGNOSIS — R0602 Shortness of breath: Secondary | ICD-10-CM | POA: Diagnosis not present

## 2021-10-22 DIAGNOSIS — J9601 Acute respiratory failure with hypoxia: Secondary | ICD-10-CM | POA: Diagnosis not present

## 2021-10-22 DIAGNOSIS — I517 Cardiomegaly: Secondary | ICD-10-CM | POA: Diagnosis not present

## 2021-10-22 DIAGNOSIS — R0902 Hypoxemia: Secondary | ICD-10-CM | POA: Diagnosis not present

## 2021-10-22 DIAGNOSIS — Z9181 History of falling: Secondary | ICD-10-CM

## 2021-10-22 DIAGNOSIS — J9621 Acute and chronic respiratory failure with hypoxia: Secondary | ICD-10-CM | POA: Diagnosis not present

## 2021-10-22 DIAGNOSIS — Z20822 Contact with and (suspected) exposure to covid-19: Secondary | ICD-10-CM | POA: Diagnosis not present

## 2021-10-22 DIAGNOSIS — J189 Pneumonia, unspecified organism: Secondary | ICD-10-CM | POA: Diagnosis not present

## 2021-10-22 DIAGNOSIS — I509 Heart failure, unspecified: Secondary | ICD-10-CM | POA: Diagnosis not present

## 2021-10-22 DIAGNOSIS — I214 Non-ST elevation (NSTEMI) myocardial infarction: Secondary | ICD-10-CM | POA: Diagnosis not present

## 2021-10-22 NOTE — Therapy (Signed)
[place] [facility] [address], [place], [address] Phone: [phone]   Fax:  [phone]  Physical Therapy Treatment  Patient Details  Name: Howard [name] MRN: [id number] Date of Birth: 10-20-1931 Referring Provider (PT): Earlie Server   Encounter Date: 10/22/2021   PT End of Session - 10/22/21 1035     Visit Number 5    Number of Visits 17    Date for PT Re-Evaluation 11/06/21    Authorization - Visit Number 5    Progress Note Due on Visit 10    PT Start Time 0900    PT Stop Time 0942    PT Time Calculation (min) 42 min    Equipment Utilized During Treatment Gait belt    Activity Tolerance Patient limited by fatigue    Behavior During Therapy [facility] for tasks assessed/performed             Past Medical History:  Diagnosis Date   Arthritis    Bell palsy    Bell's palsy 04/12/2015   Cancer [facility])    prostate and skin   Chronic combined systolic and diastolic CHF, NYHA class 1 ([place])    a. 07/2014 Echo: EF 35-40%, Gr 1 DD.   Complete heart block ([place])    a. 11/2010 s/p SJM 2210 [employer] DC PPM, ser# [id number].   Depression    Diabetes mellitus without complication ([place])    Fall 11-10-14   GERD (gastroesophageal reflux disease)    History of prostate cancer    Hyperlipidemia    Hypertension    LBBB (left bundle branch block)    Left-sided Bell's palsy    Lung cancer ([place]) 2016   NICM (nonischemic cardiomyopathy) ([place])    a. 07/2014 Echo: EF 35-40%, mid-apicalanteroseptal DK, Gr 1 DD, mild-mod dil LA.   Non-obstructive CAD    a. 07/2014 Abnl MV;  b. 08/2014 Cath: LM nl, LAD 30p, RI 40p, LCX nl, OM1 40, RCA dominant 30p, 70d-->Med Rx.   Poor balance    Presence of permanent cardiac pacemaker    Sleep apnea    a. cpap   Vertigo    WPW (Wolff-Parkinson-White syndrome)    a. S/P RFCA 1991.    Past Surgical History:  Procedure Laterality Date   ABDOMINAL AORTIC ENDOVASCULAR STENT GRAFT  08/25/2019    Procedure: ABDOMINAL AORTIC ENDOVASCULAR STENT GRAFT;  Surgeon: Algernon Huxley, MD;  Location: [facility] ORS;  Service: Vascular;;   ANGIOPLASTY Left 08/25/2019   Procedure: ANGIOPLASTY;  Surgeon: Algernon Huxley, MD;  Location: [facility] ORS;  Service: Vascular;  Laterality: Left;  left SFA and stent placement   APPLICATION OF WOUND VAC Left 06/07/2015   Procedure: APPLICATION OF WOUND VAC;  Surgeon: Robert Bellow, MD;  Location: [facility] ORS;  Service: General;  Laterality: Left;  left upper back   [place]  08/26/2014   Single vessel obstructive CAD   CARPAL TUNNEL RELEASE  04-04-15   [facility]   CATARACT EXTRACTION  07-31-11 and 09-18-11   Catheter ablation  1991   for WPW   cervical fusion     CHOLECYSTECTOMY  09-07-14   ENDARTERECTOMY FEMORAL Left 08/25/2019   Procedure: ENDARTERECTOMY FEMORAL;  Surgeon: Algernon Huxley, MD;  Location: [facility] ORS;  Service: Vascular;  Laterality: Left;  common and produndis    ENDOVASCULAR REPAIR/STENT GRAFT Right 08/25/2019   Procedure: ENDOVASCULAR REPAIR/STENT GRAFT;  Surgeon: Algernon Huxley,  MD;  Location: [facility] ORS;  Service: Vascular;  Laterality: Right;  renal artery   HAND SURGERY     right 1993; left 2005   [place] / REPLACE / REMOVE PACEMAKER     INSERTION OF ILIAC STENT Bilateral 08/25/2019   Procedure: INSERTION OF ILIAC STENT;  Surgeon: Algernon Huxley, MD;  Location: [facility] ORS;  Service: Vascular;  Laterality: Bilateral;   JOINT REPLACEMENT Left 2013   knee   JOINT REPLACEMENT Right 2004   knee   KNEE SURGERY     left knee 1991 and 1992; right knee [place] N/A 08/26/2014   Procedure: LEFT HEART CATHETERIZATION WITH CORONARY ANGIOGRAM;  Surgeon: [name], MD;  Location: [facility] CATH LAB;  Service: Cardiovascular;  Laterality: N/A;   LOWER EXTREMITY ANGIOGRAPHY Left 08/23/2019   Procedure: Lower Extremity Angiography;  Surgeon: Algernon Huxley, MD;  Location: [place]  CV LAB;  Service: Cardiovascular;  Laterality: Left;   LUMBAR LAMINECTOMY/DECOMPRESSION MICRODISCECTOMY N/A 06/07/2014   Procedure: LUMBAR FOUR TO FIVE LUMBAR LAMINECTOMY/DECOMPRESSION MICRODISCECTOMY 1 LEVEL;  Surgeon: [name], MD;  Location: [place] NEURO ORS;  Service: Neurosurgery;  Laterality: N/A;   LUNG BIOPSY Right 2016   Dr [name]   MOHS SURGERY     PACEMAKER INSERTION     PPM-- St Jude 11/30/10 by [name]   PPM GENERATOR CHANGEOUT N/A 07/09/2019   Procedure: PPM GENERATOR CHANGEOUT;  Surgeon: Deboraha Sprang, MD;  Location: [place] CV LAB;  Service: Cardiovascular;  Laterality: N/A;   PROSTATE SURGERY     cancer--1998, prostatectomy   REPLACEMENT TOTAL KNEE     2004   RIGHT/LEFT HEART CATH AND CORONARY ANGIOGRAPHY Bilateral 04/13/2021   Procedure: RIGHT/LEFT HEART CATH AND CORONARY ANGIOGRAPHY;  Surgeon: Wellington Hampshire, MD;  Location: [place] CV LAB;  Service: Cardiovascular;  Laterality: Bilateral;   ruptured disc     1962 and 1998   TEE WITHOUT CARDIOVERSION N/A 09/01/2019   Procedure: TRANSESOPHAGEAL ECHOCARDIOGRAM (TEE);  Surgeon: [name], MD;  Location: [facility] ORS;  Service: Cardiovascular;  Laterality: N/A;   TEMPORARY PACEMAKER N/A 07/09/2019   Procedure: TEMPORARY PACEMAKER;  Surgeon: Deboraha Sprang, MD;  Location: [place] CV LAB;  Service: Cardiovascular;  Laterality: N/A;   TRIGGER FINGER RELEASE  01-24-15   WOUND DEBRIDEMENT Left 06/07/2015   Procedure: DEBRIDEMENT WOUND;  Surgeon: Robert Bellow, MD;  Location: [facility] ORS;  Service: General;  Laterality: Left;  left upper back    Vitals:   10/22/21 0906  BP: (!) 122/57  Pulse: 81  SpO2: 97%     Subjective Assessment - 10/22/21 0902     Subjective Pt reports being able to complete HEP without issues. He reports continuing to walk at home but has to watch his oxygen.    Pertinent History Pt is a 87 y.o. male referred to PT for imbalance and strength. Pt reports having a fall exactly one week ago  today due to "blacking out" when standing up. Vitals were stable with EMS and denied need for it. Denies any further falls. Pt reports prior to receiving rollator, he was a furniture walker at home. Has been in cardiac rehab for CHF and has not been walking due to fear of falling because he did not have his rollator yet. Reports being more active since rollator for the last month or so. At home pt has adequate night light set up to  assist pt in getting to bathroom in the middle of the night safely. Pt has extensive PMH with low back and cardiac operations with most recent being a heart catheterization in April. Pt unable to trust himself and is overall scared of falling due to poor balance. Pt's goal is to improve safety with ambulation.    Limitations Sitting;House hold activities;Lifting    How long can you sit comfortably? no issues    How long can you stand comfortably? unsure    How long can you walk comfortably? 1 lap around cardiac rehab loop.    Patient Stated Goals Improve balance and walking            Therex    Nu Step L3 x 5 min Seat 9 for gentle LE strengthening and cardiovascular endurance    Sit to Stands 2 x 10 reps without UE support for success in transfer; cues to use limit use of momentum   AMB 20 ft x 6 trials x 3 Hurdle step over LE with CGA with difficulty with hip flexion and awareness of trailing LE even with heavy tactile and verbal cueing   Standing marches with #2 AW 2 x 30 sec    Alternating toe tap onto treadmill 8" step 1 x 10 2# reps each foot with single UE support   *frequent rest breaks given for adequate recovery               PT Education - 10/22/21 1045     Education Details therex form/technique    Person(s) Educated Patient    Methods Explanation;Demonstration    Comprehension Verbalized understanding;Returned demonstration;Verbal cues required                 PT Long Term Goals - 10/22/21 1059       PT LONG TERM GOAL #1    Title Pt will improve TUG to < 12 sec to decrease risk of falls with household transfers and ambulation tasks.    Baseline 9/20: 16.07 secs    Time 8    Period Weeks    Status New      PT LONG TERM GOAL #2   Title Pt will improve 30 sec STS to 11 or greater to indicate clinically significant improvement in LE strength and cardiopulmonary endurance.    Baseline 9/20: 8 reps    Time 8    Period Weeks    Status New      PT LONG TERM GOAL #3   Title Pt will improve self selected gait speed to > 0.8 m/s with no AD to demo safe household ambulation speed decreasing risk of falls.    Baseline 9/20: 0.65 m/s    Time 8    Period Weeks    Status New      PT LONG TERM GOAL #4   Title Pt will improve FOTO score to 56 to display perceived improvement with functional mobility.    Baseline 9/20: next session 10/22/21: 51    Time 8    Period Weeks    Status New                   Plan - 10/22/21 1037     Clinical Impression Statement PT continued LE strength and balance training this session. Pt tolerated session fair with increased perceived exersion compared to previous session. Pt continues to require frequent rest breaks between sets. Pt continues to require CGA-minA to correct balance when negotiating hurdles. Pt will  continue to benefit from skilled PT to address impairment and achieve set goals.    Personal Factors and Comorbidities Age;Comorbidity 3+;Fitness;Past/Current Experience;Time since onset of injury/illness/exacerbation    Comorbidities Multiple spinal surgeries, Arthritis, Diabetes, heart catheterization.    Examination-Activity Limitations Locomotion Level;Stand;Stairs    Examination-Participation Restrictions Cleaning;Community Activity;Laundry;Yard Work    Biomedical scientist High    Rehab Potential Good    PT Frequency 2x / week    PT Duration 8 weeks    PT Treatment/Interventions  ADLs/Self Care Home Management;Canalith Repostioning;Cryotherapy;Moist Heat;DME Instruction;Gait training;Stair training;Functional mobility training;Therapeutic activities;Therapeutic exercise;Balance training;Neuromuscular re-education;Patient/family education;Manual techniques;Passive range of motion;Dry needling;Vestibular    PT Next Visit Plan balance, endurance, amb with head turns vert/horizontal    PT Home Exercise Plan Sit to Stands, Marching/toe tapping    Consulted and Agree with Plan of Care Patient;Family member/caregiver             Patient will benefit from skilled therapeutic intervention in order to improve the following deficits and impairments:  Abnormal gait, Decreased knowledge of use of DME, Improper body mechanics, Pain, Impaired sensation, Cardiopulmonary status limiting activity, Postural dysfunction, Decreased activity tolerance, Decreased endurance, Decreased strength, Decreased balance, Difficulty walking, Decreased safety awareness  Visit Diagnosis: History of falling     Problem List Patient Active Problem List   Diagnosis Date Noted   Normocytic anemia 09/18/2021   Other fatigue 30/08/2329   Chronic systolic congestive heart failure ([place]) 07/18/2021   Goals of care, counseling/discussion 04/16/2021   Mediastinal lymphadenopathy 04/16/2021   Heart failure with reduced ejection fraction ([place])    AAA (abdominal aortic aneurysm) without rupture 12/10/2019   Renal artery stenosis ([place]) 12/10/2019   PAD (peripheral artery disease) ([place]) 12/10/2019   Thromboembolism ([place]) 08/30/2019   Atherosclerosis of native arteries of extremity with intermittent claudication ([place]) 08/23/2019   Swelling of limb 08/17/2019   Pain in limb 08/17/2019   NICM (nonischemic cardiomyopathy) ([place]) 07/01/2019   CHF (congestive heart failure) ([place]) 07/01/2019   Malignant neoplasm of right lung ([place]) 04/18/2016   CAD (coronary artery disease) 04/01/2016   Adenocarcinoma ([place])  04/01/2016   Skin cyst 12/26/2015   GERD (gastroesophageal reflux disease) 06/16/2015   Abscess of back 06/06/2015   Vertigo 03/27/2015   Carpal tunnel syndrome 10/28/2014   Status post cholecystectomy 09/29/2014   Disease of digestive tract 09/29/2014   Cardiomyopathy ([place]) 09/26/2014   Type 2 diabetes mellitus without complications ([place])    Sleep apnea    Spinal stenosis, lumbar region, with neurogenic claudication 06/07/2014   Lumbar stenosis with neurogenic claudication 06/07/2014   Abnormal gait 08/20/2012   H/O total knee replacement 08/20/2012   Arthritis of knee, degenerative 08/20/2012   Pacemaker-St.Jude 08/03/2012   Cardiac conduction disorder 06/19/2012   Acid reflux 06/18/2012   Nodal rhythm disorder 06/18/2012   Triggering of digit 03/25/2012   Hyperlipidemia 12/23/2011   Essential hypertension 03/23/2011   Atrioventricular block, complete ([place]) 03/23/2011   Complete atrioventricular block ([place]) 03/23/2011    Chelsea [place] DPT Sharion Settler, SPT  Durwin Reges, PT 10/22/2021, 3:02 PM  [place] [facility] [address], [place], [address] Phone: [phone]   Fax:  [phone]  Name: DAMIEL BARTHOLD MRN: [id number] Date of Birth: Jan 06, 1931

## 2021-10-23 ENCOUNTER — Other Ambulatory Visit: Payer: Self-pay

## 2021-10-23 ENCOUNTER — Ambulatory Visit (INDEPENDENT_AMBULATORY_CARE_PROVIDER_SITE_OTHER): Payer: Medicare Other | Admitting: Family Medicine

## 2021-10-23 VITALS — BP 104/48 | HR 94 | Temp 97.6°F | Wt 186.0 lb

## 2021-10-23 DIAGNOSIS — R413 Other amnesia: Secondary | ICD-10-CM

## 2021-10-23 DIAGNOSIS — C342 Malignant neoplasm of middle lobe, bronchus or lung: Secondary | ICD-10-CM

## 2021-10-23 DIAGNOSIS — I701 Atherosclerosis of renal artery: Secondary | ICD-10-CM

## 2021-10-23 DIAGNOSIS — Z23 Encounter for immunization: Secondary | ICD-10-CM

## 2021-10-23 DIAGNOSIS — I714 Abdominal aortic aneurysm, without rupture, unspecified: Secondary | ICD-10-CM | POA: Diagnosis not present

## 2021-10-23 DIAGNOSIS — I428 Other cardiomyopathies: Secondary | ICD-10-CM

## 2021-10-23 DIAGNOSIS — E1140 Type 2 diabetes mellitus with diabetic neuropathy, unspecified: Secondary | ICD-10-CM | POA: Diagnosis not present

## 2021-10-23 DIAGNOSIS — E119 Type 2 diabetes mellitus without complications: Secondary | ICD-10-CM

## 2021-10-23 DIAGNOSIS — I10 Essential (primary) hypertension: Secondary | ICD-10-CM

## 2021-10-23 DIAGNOSIS — I5022 Chronic systolic (congestive) heart failure: Secondary | ICD-10-CM | POA: Diagnosis not present

## 2021-10-23 DIAGNOSIS — Z950 Presence of cardiac pacemaker: Secondary | ICD-10-CM | POA: Diagnosis not present

## 2021-10-23 DIAGNOSIS — E782 Mixed hyperlipidemia: Secondary | ICD-10-CM | POA: Diagnosis not present

## 2021-10-23 MED ORDER — METFORMIN HCL 500 MG PO TABS: 500.0000 mg | ORAL_TABLET | Freq: Two times a day (BID) | ORAL | 5 refills | Status: DC

## 2021-10-23 MED ORDER — SERTRALINE HCL 50 MG PO TABS: 50.0000 mg | ORAL_TABLET | Freq: Every day | ORAL | 3 refills | Status: AC

## 2021-10-23 MED ORDER — DONEPEZIL HCL 5 MG PO TABS: 5.0000 mg | ORAL_TABLET | Freq: Every day | ORAL | 12 refills | Status: DC

## 2021-10-23 NOTE — Patient Instructions (Addendum)
Restart Donepezil. Get covid vaccine.

## 2021-10-23 NOTE — Progress Notes (Signed)
Established patient visit   Patient: Howard [name]   DOB: 1931-10-24   87 y.o. Male  MRN: [id number] Visit Date: 10/23/2021  Today's healthcare provider: Wilhemena Durie, MD   No chief complaint on file.  Subjective    HPI  This is my first visit with this very pleasant 87 year old who comes in today with cognitive impairment.  He does not have much of an appetite.  He is wondering what he should get COVID next booster Family Brings in a list of his issues. Evidently has some anhedonia and depression and perks up only when family members come around. He has lung cancer and has had 35 radiation treatments this year and a heart track also.  Since all of this he has been very sedentary and has no energy. Suction through the night and whenever he moves around at home.  Diabetes Mellitus Type II, Follow-up  Lab Results  Component Value Date   HGBA1C 7.7 (A) 11/09/2020   HGBA1C 7.4 (H) 05/08/2020   HGBA1C 7.5 (H) 12/10/2019   Wt Readings from Last 3 Encounters:  10/23/21 186 lb (84.4 kg)  10/17/21 184 lb 8 oz (83.7 kg)  09/18/21 183 lb 9.6 oz (83.3 kg)   Last seen for diabetes 1 years ago.  Management since then includes none. He reports good compliance with treatment. He is not having side effects.  Symptoms: Yes fatigue No foot ulcerations  Yes appetite changes No nausea  Yes paresthesia of the feet  No polydipsia  No polyuria No visual disturbances   No vomiting     Home blood sugar records:  not being checked  Episodes of hypoglycemia? No    Current insulin regiment: none   Pertinent Labs: Lab Results  Component Value Date   CHOL 130 11/13/2020   HDL 43 11/13/2020   LDLCALC 63 11/13/2020   TRIG 137 11/13/2020   CHOLHDL 3.0 11/13/2020   Lab Results  Component Value Date   NA 137 09/18/2021   K 4.7 09/18/2021   CREATININE 1.27 (H) 09/18/2021   GFRNONAA 54 (L) 09/18/2021   MICROALBUR 100 11/27/2018   LABMICR 283.3 05/08/2020      ---------------------------------------------------------------------------------------------------     Medications: Outpatient Medications Prior to Visit  Medication Sig   acetaminophen (TYLENOL) 500 MG tablet Take 1,000 mg by mouth every 6 (six) hours as needed for moderate pain.   albuterol (VENTOLIN HFA) 108 (90 Base) MCG/ACT inhaler INHALE 2 PUFFS INTO LUNGS EVERY 6 HOURS AS NEEDED FOR WHEEZING OR SHORTNESS OF BREATH   aspirin EC 81 MG tablet Take 81 mg by mouth daily.   cetirizine (ZYRTEC) 10 MG tablet Take 10 mg by mouth daily.   Cholecalciferol (VITAMIN D3) 1000 UNITS CAPS Take 1,000 Units by mouth daily.   ELIQUIS 5 MG TABS tablet TAKE 1 TABLET TWICE A DAY  (SWITCHED FROM PLAVIX)   ezetimibe (ZETIA) 10 MG tablet TAKE 1 TABLET DAILY   Fluticasone-Umeclidin-Vilant (TRELEGY ELLIPTA) 100-62.5-25 MCG/INH AEPB Inhale 1 puff into the lungs daily.   furosemide (LASIX) 20 MG tablet TAKE 1 TABLET BY MOUTH ONCE DAILY   Iron-Vitamin C (VITRON-C) 65-125 MG TABS Take 1 tablet by mouth daily.   isosorbide mononitrate (IMDUR) 30 MG 24 hr tablet TAKE 1 TABLET DAILY   metFORMIN (GLUCOPHAGE) 500 MG tablet TAKE 1 TABLET TWICE DAILY  WITH MEALS   metoprolol succinate (TOPROL-XL) 25 MG 24 hr tablet TAKE 1 TABLET AT BEDTIME   montelukast (SINGULAIR) 10 MG tablet Take  10 mg by mouth daily as needed (sneezing/allergies.).   Multiple Vitamin (MULTIVITAMIN WITH MINERALS) TABS tablet Take 1 tablet by mouth daily. One-A-Day Multivitamin   omeprazole (PRILOSEC) 40 MG capsule TAKE 1 CAPSULE TWICE DAILY AS NEEDED   sacubitril-valsartan (ENTRESTO) 49-51 MG Take 1 tablet by mouth 2 (two) times daily.   simvastatin (ZOCOR) 40 MG tablet TAKE 1 TABLET AT BEDTIME   spironolactone (ALDACTONE) 25 MG tablet Take 1 tablet (25 mg total) by mouth daily.   donepezil (ARICEPT) 5 MG tablet Take 5 mg by mouth daily. (Patient not taking: No sig reported)   megestrol (MEGACE) 40 MG tablet Take 1 tablet (40 mg total) by mouth  2 (two) times daily.   promethazine-dextromethorphan (PROMETHAZINE-DM) 6.25-15 MG/5ML syrup Take 5 mLs by mouth 3 (three) times daily as needed for cough. (Patient not taking: No sig reported)   No facility-administered medications prior to visit.    Review of Systems  Respiratory:  Positive for shortness of breath and wheezing. Negative for cough.   Cardiovascular:  Negative for chest pain, palpitations and leg swelling.  Neurological:  Negative for dizziness and headaches.      Objective    BP (!) 104/48 (BP Location: Right Arm, Patient Position: Sitting, Cuff Size: Large)   Pulse 94   Temp 97.6 F (36.4 C) (Oral)   Wt 186 lb (84.4 kg)   SpO2 95%   BMI 27.47 kg/m  {Show previous vital signs (optional):23777}  Physical Exam Vitals reviewed.  Constitutional:      General: He is not in acute distress.    Appearance: He is well-developed.  HENT:     Head: Normocephalic and atraumatic.     Right Ear: Hearing normal.     Left Ear: Hearing normal.     Nose: Nose normal.  Eyes:     General: Lids are normal. No scleral icterus.       Right eye: No discharge.        Left eye: No discharge.     Conjunctiva/sclera: Conjunctivae normal.  Cardiovascular:     Rate and Rhythm: Regular rhythm.     Heart sounds: Normal heart sounds.  Pulmonary:     Effort: Pulmonary effort is normal. No respiratory distress.  Abdominal:     General: Bowel sounds are normal.     Palpations: Abdomen is soft.  Musculoskeletal:        General: Normal range of motion.  Skin:    Findings: No lesion or rash.  Neurological:     Mental Status: He is alert.     Comments: Memory declining.  Psychiatric:        Speech: Speech normal.        Behavior: Behavior normal.        Thought Content: Thought content normal.    MMSE 23/30 today  No results found for any visits on 10/23/21.  Assessment & Plan     1. Type 2 diabetes mellitus with diabetic neuropathy, unspecified whether long term insulin use  (HCC) Will A1c less than 80 in this 87 year old - Hemoglobin A1c  2. Need for influenza vaccination   3. Essential hypertension Mildly hypotensive today.  We will have to watch this.  Consider cutting back on metoprolol spironolactone - CBC with Differential/Platelet - Comprehensive metabolic panel - TSH  4. Mixed hyperlipidemia Simvastatin - Lipid Panel With LDL/HDL Ratio  5. Memory loss Restart donepezil 5 mg daily  6. NICM (nonischemic cardiomyopathy) ([place]) Followed by cardiology with a EF of  25%  7. Chronic systolic congestive heart failure ([place])   8. Pacemaker-St.Jude   9. Abdominal aortic aneurysm (AAA) without rupture, unspecified part   10. Malignant neoplasm of middle lobe of right lung [facility]) Status post radiation therapy   No follow-ups on file.      I, Wilhemena Durie, MD, have reviewed all documentation for this visit. The documentation on 10/28/21 for the exam, diagnosis, procedures, and orders are all accurate and complete.    Azarya Oconnell Cranford Mon, MD  [facility] [phone] (phone) [phone] (fax)  [place]

## 2021-10-24 ENCOUNTER — Emergency Department: Payer: Medicare Other

## 2021-10-24 ENCOUNTER — Other Ambulatory Visit: Payer: Self-pay

## 2021-10-24 ENCOUNTER — Ambulatory Visit: Payer: Medicare Other | Admitting: Physical Therapy

## 2021-10-24 ENCOUNTER — Inpatient Hospital Stay: Payer: Medicare Other

## 2021-10-24 ENCOUNTER — Inpatient Hospital Stay
Admission: EM | Admit: 2021-10-24 | Discharge: 2021-10-28 | DRG: 280 | Disposition: A | Discharge status: Home Health | Payer: Medicare Other | Attending: Internal Medicine | Admitting: Internal Medicine

## 2021-10-24 DIAGNOSIS — R0902 Hypoxemia: Secondary | ICD-10-CM | POA: Diagnosis not present

## 2021-10-24 DIAGNOSIS — F32A Depression, unspecified: Secondary | ICD-10-CM | POA: Diagnosis present

## 2021-10-24 DIAGNOSIS — Z7189 Other specified counseling: Secondary | ICD-10-CM

## 2021-10-24 DIAGNOSIS — Z7984 Long term (current) use of oral hypoglycemic drugs: Secondary | ICD-10-CM | POA: Diagnosis not present

## 2021-10-24 DIAGNOSIS — K219 Gastro-esophageal reflux disease without esophagitis: Secondary | ICD-10-CM | POA: Diagnosis present

## 2021-10-24 DIAGNOSIS — E1165 Type 2 diabetes mellitus with hyperglycemia: Secondary | ICD-10-CM | POA: Diagnosis present

## 2021-10-24 DIAGNOSIS — I214 Non-ST elevation (NSTEMI) myocardial infarction: Secondary | ICD-10-CM | POA: Diagnosis present

## 2021-10-24 DIAGNOSIS — I110 Hypertensive heart disease with heart failure: Principal | ICD-10-CM | POA: Diagnosis present

## 2021-10-24 DIAGNOSIS — J9811 Atelectasis: Secondary | ICD-10-CM | POA: Diagnosis not present

## 2021-10-24 DIAGNOSIS — Z9981 Dependence on supplemental oxygen: Secondary | ICD-10-CM | POA: Diagnosis not present

## 2021-10-24 DIAGNOSIS — Z87891 Personal history of nicotine dependence: Secondary | ICD-10-CM | POA: Diagnosis not present

## 2021-10-24 DIAGNOSIS — I25118 Atherosclerotic heart disease of native coronary artery with other forms of angina pectoris: Secondary | ICD-10-CM | POA: Diagnosis not present

## 2021-10-24 DIAGNOSIS — J9600 Acute respiratory failure, unspecified whether with hypoxia or hypercapnia: Secondary | ICD-10-CM | POA: Diagnosis not present

## 2021-10-24 DIAGNOSIS — Z20822 Contact with and (suspected) exposure to covid-19: Secondary | ICD-10-CM | POA: Diagnosis present

## 2021-10-24 DIAGNOSIS — I420 Dilated cardiomyopathy: Secondary | ICD-10-CM | POA: Diagnosis not present

## 2021-10-24 DIAGNOSIS — Z950 Presence of cardiac pacemaker: Secondary | ICD-10-CM | POA: Diagnosis not present

## 2021-10-24 DIAGNOSIS — I442 Atrioventricular block, complete: Secondary | ICD-10-CM | POA: Diagnosis present

## 2021-10-24 DIAGNOSIS — Z7901 Long term (current) use of anticoagulants: Secondary | ICD-10-CM

## 2021-10-24 DIAGNOSIS — R9389 Abnormal findings on diagnostic imaging of other specified body structures: Secondary | ICD-10-CM | POA: Diagnosis not present

## 2021-10-24 DIAGNOSIS — Z8546 Personal history of malignant neoplasm of prostate: Secondary | ICD-10-CM

## 2021-10-24 DIAGNOSIS — I5023 Acute on chronic systolic (congestive) heart failure: Secondary | ICD-10-CM | POA: Diagnosis not present

## 2021-10-24 DIAGNOSIS — J9620 Acute and chronic respiratory failure, unspecified whether with hypoxia or hypercapnia: Secondary | ICD-10-CM | POA: Insufficient documentation

## 2021-10-24 DIAGNOSIS — Z83438 Family history of other disorder of lipoprotein metabolism and other lipidemia: Secondary | ICD-10-CM

## 2021-10-24 DIAGNOSIS — I517 Cardiomegaly: Secondary | ICD-10-CM | POA: Diagnosis not present

## 2021-10-24 DIAGNOSIS — G4733 Obstructive sleep apnea (adult) (pediatric): Secondary | ICD-10-CM | POA: Diagnosis present

## 2021-10-24 DIAGNOSIS — Z7951 Long term (current) use of inhaled steroids: Secondary | ICD-10-CM

## 2021-10-24 DIAGNOSIS — R0689 Other abnormalities of breathing: Secondary | ICD-10-CM | POA: Diagnosis not present

## 2021-10-24 DIAGNOSIS — C3411 Malignant neoplasm of upper lobe, right bronchus or lung: Secondary | ICD-10-CM | POA: Diagnosis present

## 2021-10-24 DIAGNOSIS — F0390 Unspecified dementia without behavioral disturbance: Secondary | ICD-10-CM | POA: Diagnosis present

## 2021-10-24 DIAGNOSIS — Z79899 Other long term (current) drug therapy: Secondary | ICD-10-CM

## 2021-10-24 DIAGNOSIS — Z882 Allergy status to sulfonamides status: Secondary | ICD-10-CM

## 2021-10-24 DIAGNOSIS — J80 Acute respiratory distress syndrome: Secondary | ICD-10-CM | POA: Diagnosis not present

## 2021-10-24 DIAGNOSIS — I428 Other cardiomyopathies: Secondary | ICD-10-CM

## 2021-10-24 DIAGNOSIS — C801 Malignant (primary) neoplasm, unspecified: Secondary | ICD-10-CM

## 2021-10-24 DIAGNOSIS — Z86718 Personal history of other venous thrombosis and embolism: Secondary | ICD-10-CM

## 2021-10-24 DIAGNOSIS — J189 Pneumonia, unspecified organism: Secondary | ICD-10-CM

## 2021-10-24 DIAGNOSIS — Z7401 Bed confinement status: Secondary | ICD-10-CM | POA: Diagnosis not present

## 2021-10-24 DIAGNOSIS — E872 Acidosis, unspecified: Secondary | ICD-10-CM | POA: Diagnosis present

## 2021-10-24 DIAGNOSIS — R918 Other nonspecific abnormal finding of lung field: Secondary | ICD-10-CM

## 2021-10-24 DIAGNOSIS — I498 Other specified cardiac arrhythmias: Secondary | ICD-10-CM

## 2021-10-24 DIAGNOSIS — Z923 Personal history of irradiation: Secondary | ICD-10-CM | POA: Diagnosis not present

## 2021-10-24 DIAGNOSIS — Z96653 Presence of artificial knee joint, bilateral: Secondary | ICD-10-CM | POA: Diagnosis present

## 2021-10-24 DIAGNOSIS — I10 Essential (primary) hypertension: Secondary | ICD-10-CM | POA: Diagnosis not present

## 2021-10-24 DIAGNOSIS — J9621 Acute and chronic respiratory failure with hypoxia: Secondary | ICD-10-CM | POA: Diagnosis present

## 2021-10-24 DIAGNOSIS — I459 Conduction disorder, unspecified: Secondary | ICD-10-CM

## 2021-10-24 DIAGNOSIS — R0602 Shortness of breath: Secondary | ICD-10-CM | POA: Diagnosis not present

## 2021-10-24 DIAGNOSIS — R000 Tachycardia, unspecified: Secondary | ICD-10-CM | POA: Diagnosis not present

## 2021-10-24 DIAGNOSIS — J439 Emphysema, unspecified: Secondary | ICD-10-CM | POA: Diagnosis present

## 2021-10-24 DIAGNOSIS — I509 Heart failure, unspecified: Secondary | ICD-10-CM | POA: Diagnosis not present

## 2021-10-24 DIAGNOSIS — J90 Pleural effusion, not elsewhere classified: Secondary | ICD-10-CM | POA: Diagnosis not present

## 2021-10-24 DIAGNOSIS — E785 Hyperlipidemia, unspecified: Secondary | ICD-10-CM | POA: Diagnosis present

## 2021-10-24 DIAGNOSIS — Z8249 Family history of ischemic heart disease and other diseases of the circulatory system: Secondary | ICD-10-CM

## 2021-10-24 DIAGNOSIS — I5022 Chronic systolic (congestive) heart failure: Secondary | ICD-10-CM

## 2021-10-24 DIAGNOSIS — R778 Other specified abnormalities of plasma proteins: Secondary | ICD-10-CM | POA: Diagnosis not present

## 2021-10-24 DIAGNOSIS — R54 Age-related physical debility: Secondary | ICD-10-CM | POA: Diagnosis present

## 2021-10-24 DIAGNOSIS — J9601 Acute respiratory failure with hypoxia: Secondary | ICD-10-CM

## 2021-10-24 DIAGNOSIS — Z7982 Long term (current) use of aspirin: Secondary | ICD-10-CM

## 2021-10-24 LAB — MRSA NEXT GEN BY PCR, NASAL: MRSA by PCR Next Gen: NOT DETECTED

## 2021-10-24 LAB — CBC
HCT: 39.3 % (ref 39.0–52.0)
Hemoglobin: 12.6 g/dL — ABNORMAL LOW (ref 13.0–17.0)
MCH: 27.4 pg (ref 26.0–34.0)
MCHC: 32.1 g/dL (ref 30.0–36.0)
MCV: 85.4 fL (ref 80.0–100.0)
Platelets: 282 10*3/uL (ref 150–400)
RBC: 4.6 MIL/uL (ref 4.22–5.81)
RDW: 18.7 % — ABNORMAL HIGH (ref 11.5–15.5)
WBC: 9.7 10*3/uL (ref 4.0–10.5)
nRBC: 0 % (ref 0.0–0.2)

## 2021-10-24 LAB — COMPREHENSIVE METABOLIC PANEL
ALT: 15 U/L (ref 0–44)
AST: 21 U/L (ref 15–41)
Albumin: 3.8 g/dL (ref 3.5–5.0)
Alkaline Phosphatase: 49 U/L (ref 38–126)
Anion gap: 11 (ref 5–15)
BUN: 25 mg/dL — ABNORMAL HIGH (ref 8–23)
CO2: 18 mmol/L — ABNORMAL LOW (ref 22–32)
Calcium: 9.1 mg/dL (ref 8.9–10.3)
Chloride: 110 mmol/L (ref 98–111)
Creatinine, Ser: 1.27 mg/dL — ABNORMAL HIGH (ref 0.61–1.24)
GFR, Estimated: 54 mL/min — ABNORMAL LOW (ref >60)
Glucose, Bld: 241 mg/dL — ABNORMAL HIGH (ref 70–99)
Potassium: 4.1 mmol/L (ref 3.5–5.1)
Sodium: 139 mmol/L (ref 135–145)
Total Bilirubin: 0.8 mg/dL (ref 0.3–1.2)
Total Protein: 7.2 g/dL (ref 6.5–8.1)

## 2021-10-24 LAB — CBG MONITORING, ED
Glucose-Capillary: 141 mg/dL — ABNORMAL HIGH (ref 70–99)
Glucose-Capillary: 165 mg/dL — ABNORMAL HIGH (ref 70–99)

## 2021-10-24 LAB — C-REACTIVE PROTEIN: CRP: <0.5 mg/dL (ref <1.0)

## 2021-10-24 LAB — TROPONIN I (HIGH SENSITIVITY)
Troponin I (High Sensitivity): 70 ng/L — ABNORMAL HIGH (ref <18)
Troponin I (High Sensitivity): 180 ng/L (ref <18)
Troponin I (High Sensitivity): 12859 ng/L (ref <18)
Troponin I (High Sensitivity): 2390 ng/L (ref <18)

## 2021-10-24 LAB — PROCALCITONIN: Procalcitonin: <0.1 ng/mL

## 2021-10-24 LAB — HEMOGLOBIN A1C
Hgb A1c MFr Bld: 8.2 % — ABNORMAL HIGH (ref 4.8–5.6)
Mean Plasma Glucose: 188.64 mg/dL

## 2021-10-24 LAB — BRAIN NATRIURETIC PEPTIDE: B Natriuretic Peptide: 4227.2 pg/mL — ABNORMAL HIGH (ref 0.0–100.0)

## 2021-10-24 LAB — LACTIC ACID, PLASMA
Lactic Acid, Venous: 3.1 mmol/L (ref 0.5–1.9)
Lactic Acid, Venous: 2.2 mmol/L (ref 0.5–1.9)

## 2021-10-24 LAB — RESP PANEL BY RT-PCR (FLU A&B, COVID) ARPGX2
Influenza A by PCR: NEGATIVE
Influenza B by PCR: NEGATIVE
SARS Coronavirus 2 by RT PCR: NEGATIVE

## 2021-10-24 LAB — STREP PNEUMONIAE URINARY ANTIGEN: Strep Pneumo Urinary Antigen: NEGATIVE

## 2021-10-24 MED ORDER — ONDANSETRON HCL 4 MG/2ML IJ SOLN: 4.0000 mg | Freq: Four times a day (QID) | INTRAMUSCULAR | Status: DC | PRN

## 2021-10-24 MED ORDER — PANTOPRAZOLE SODIUM 40 MG PO TBEC
40.0000 mg | DELAYED_RELEASE_TABLET | Freq: Every day | ORAL | Status: DC
  Administered 2021-10-24 – 2021-10-28 (×5): 40 mg via ORAL
  Filled 2021-10-24 (×5): qty 1

## 2021-10-24 MED ORDER — SODIUM CHLORIDE 0.9% FLUSH: 3.0000 mL | INTRAVENOUS | Status: DC | PRN

## 2021-10-24 MED ORDER — UMECLIDINIUM BROMIDE 62.5 MCG/ACT IN AEPB
1.0000 | INHALATION_SPRAY | Freq: Every day | RESPIRATORY_TRACT | Status: DC
  Administered 2021-10-24 – 2021-10-28 (×5): 1 via RESPIRATORY_TRACT
  Filled 2021-10-24: qty 7

## 2021-10-24 MED ORDER — SPIRONOLACTONE 25 MG PO TABS
25.0000 mg | ORAL_TABLET | Freq: Every day | ORAL | Status: DC
  Administered 2021-10-24 – 2021-10-28 (×5): 25 mg via ORAL
  Filled 2021-10-24 (×5): qty 1

## 2021-10-24 MED ORDER — FUROSEMIDE 10 MG/ML IJ SOLN
40.0000 mg | Freq: Every day | INTRAMUSCULAR | Status: DC
  Administered 2021-10-25 – 2021-10-27 (×3): 40 mg via INTRAVENOUS
  Filled 2021-10-24 (×3): qty 4

## 2021-10-24 MED ORDER — VANCOMYCIN HCL IN DEXTROSE 1-5 GM/200ML-% IV SOLN
1000.0000 mg | Freq: Once | INTRAVENOUS | Status: AC
  Administered 2021-10-24: 1000 mg via INTRAVENOUS
  Filled 2021-10-24: qty 200

## 2021-10-24 MED ORDER — LORATADINE 10 MG PO TABS
10.0000 mg | ORAL_TABLET | Freq: Every day | ORAL | Status: DC
  Administered 2021-10-24 – 2021-10-28 (×5): 10 mg via ORAL
  Filled 2021-10-24 (×5): qty 1

## 2021-10-24 MED ORDER — METOPROLOL SUCCINATE ER 25 MG PO TB24
25.0000 mg | ORAL_TABLET | Freq: Every day | ORAL | Status: DC
  Administered 2021-10-24 – 2021-10-27 (×4): 25 mg via ORAL
  Filled 2021-10-24 (×4): qty 1

## 2021-10-24 MED ORDER — SACUBITRIL-VALSARTAN 49-51 MG PO TABS
1.0000 | ORAL_TABLET | Freq: Two times a day (BID) | ORAL | Status: DC
Start: 2021-10-24 — End: 2021-10-28
  Administered 2021-10-24 – 2021-10-28 (×8): 1 via ORAL
  Filled 2021-10-24 (×9): qty 1

## 2021-10-24 MED ORDER — SODIUM CHLORIDE 0.9 % IV SOLN: 250.0000 mL | INTRAVENOUS | Status: DC | PRN

## 2021-10-24 MED ORDER — IOHEXOL 350 MG/ML SOLN
75.0000 mL | Freq: Once | INTRAVENOUS | Status: AC | PRN
  Administered 2021-10-24: 75 mL via INTRAVENOUS

## 2021-10-24 MED ORDER — DONEPEZIL HCL 5 MG PO TABS
5.0000 mg | ORAL_TABLET | Freq: Every day | ORAL | Status: DC
  Administered 2021-10-24 – 2021-10-28 (×5): 5 mg via ORAL
  Filled 2021-10-24 (×5): qty 1

## 2021-10-24 MED ORDER — NITROGLYCERIN 2 % TD OINT
1.0000 [in_us] | TOPICAL_OINTMENT | Freq: Once | TRANSDERMAL | Status: AC
  Administered 2021-10-24: 1 [in_us] via TOPICAL
  Filled 2021-10-24: qty 1

## 2021-10-24 MED ORDER — SODIUM CHLORIDE 0.9 % IV SOLN
2.0000 g | Freq: Two times a day (BID) | INTRAVENOUS | Status: DC
  Administered 2021-10-24 – 2021-10-28 (×8): 2 g via INTRAVENOUS
  Filled 2021-10-24 (×9): qty 2

## 2021-10-24 MED ORDER — FUROSEMIDE 10 MG/ML IJ SOLN
60.0000 mg | Freq: Once | INTRAMUSCULAR | Status: AC
  Administered 2021-10-24: 60 mg via INTRAVENOUS
  Filled 2021-10-24: qty 8

## 2021-10-24 MED ORDER — FLUTICASONE FUROATE-VILANTEROL 100-25 MCG/ACT IN AEPB
1.0000 | INHALATION_SPRAY | Freq: Every day | RESPIRATORY_TRACT | Status: DC
  Administered 2021-10-24 – 2021-10-28 (×5): 1 via RESPIRATORY_TRACT
  Filled 2021-10-24: qty 28

## 2021-10-24 MED ORDER — INSULIN ASPART 100 UNIT/ML IJ SOLN
0.0000 [IU] | Freq: Three times a day (TID) | INTRAMUSCULAR | Status: DC
  Administered 2021-10-24: 2 [IU] via SUBCUTANEOUS
  Administered 2021-10-25 (×2): 3 [IU] via SUBCUTANEOUS
  Administered 2021-10-25: 5 [IU] via SUBCUTANEOUS
  Administered 2021-10-26 (×2): 3 [IU] via SUBCUTANEOUS
  Administered 2021-10-26: 5 [IU] via SUBCUTANEOUS
  Administered 2021-10-27: 3 [IU] via SUBCUTANEOUS
  Administered 2021-10-27: 5 [IU] via SUBCUTANEOUS
  Administered 2021-10-27: 3 [IU] via SUBCUTANEOUS
  Administered 2021-10-28: 2 [IU] via SUBCUTANEOUS
  Administered 2021-10-28: 3 [IU] via SUBCUTANEOUS
  Filled 2021-10-24 (×11): qty 1

## 2021-10-24 MED ORDER — APIXABAN 5 MG PO TABS
5.0000 mg | ORAL_TABLET | Freq: Two times a day (BID) | ORAL | Status: DC
  Administered 2021-10-24: 5 mg via ORAL
  Filled 2021-10-24: qty 1

## 2021-10-24 MED ORDER — ACETAMINOPHEN 325 MG PO TABS: 650.0000 mg | ORAL_TABLET | ORAL | Status: DC | PRN

## 2021-10-24 MED ORDER — INSULIN ASPART 100 UNIT/ML IJ SOLN: 0.0000 [IU] | Freq: Every day | INTRAMUSCULAR | Status: DC

## 2021-10-24 MED ORDER — SODIUM CHLORIDE 0.9 % IV SOLN: 1.0000 g | Freq: Once | INTRAVENOUS | Status: DC

## 2021-10-24 MED ORDER — IRON-VITAMIN C 65-125 MG PO TABS: 1.0000 | ORAL_TABLET | Freq: Every day | ORAL | Status: DC

## 2021-10-24 MED ORDER — FLUTICASONE-UMECLIDIN-VILANT 100-62.5-25 MCG/ACT IN AEPB: 1.0000 | INHALATION_SPRAY | Freq: Every day | RESPIRATORY_TRACT | Status: DC

## 2021-10-24 MED ORDER — VITAMIN D 25 MCG (1000 UNIT) PO TABS
1000.0000 [IU] | ORAL_TABLET | Freq: Every day | ORAL | Status: DC
Start: 2021-10-24 — End: 2021-10-28
  Administered 2021-10-24 – 2021-10-28 (×5): 1000 [IU] via ORAL
  Filled 2021-10-24 (×5): qty 1

## 2021-10-24 MED ORDER — EZETIMIBE 10 MG PO TABS
10.0000 mg | ORAL_TABLET | Freq: Every day | ORAL | Status: DC
  Administered 2021-10-24 – 2021-10-28 (×5): 10 mg via ORAL
  Filled 2021-10-24 (×6): qty 1

## 2021-10-24 MED ORDER — VANCOMYCIN HCL IN DEXTROSE 1-5 GM/200ML-% IV SOLN: 1000.0000 mg | Freq: Once | INTRAVENOUS | Status: DC

## 2021-10-24 MED ORDER — ASPIRIN EC 81 MG PO TBEC
81.0000 mg | DELAYED_RELEASE_TABLET | Freq: Every day | ORAL | Status: DC
  Administered 2021-10-24 – 2021-10-28 (×5): 81 mg via ORAL
  Filled 2021-10-24 (×5): qty 1

## 2021-10-24 MED ORDER — ADULT MULTIVITAMIN W/MINERALS CH
1.0000 | ORAL_TABLET | Freq: Every day | ORAL | Status: DC
Start: 2021-10-24 — End: 2021-10-28
  Administered 2021-10-24 – 2021-10-28 (×5): 1 via ORAL
  Filled 2021-10-24 (×5): qty 1

## 2021-10-24 MED ORDER — MONTELUKAST SODIUM 10 MG PO TABS: 10.0000 mg | ORAL_TABLET | Freq: Every day | ORAL | Status: DC | PRN

## 2021-10-24 MED ORDER — SODIUM CHLORIDE 0.9 % IV SOLN: 500.0000 mg | Freq: Once | INTRAVENOUS | Status: DC

## 2021-10-24 MED ORDER — SIMVASTATIN 20 MG PO TABS
40.0000 mg | ORAL_TABLET | Freq: Every day | ORAL | Status: DC
  Administered 2021-10-24 – 2021-10-27 (×4): 40 mg via ORAL
  Filled 2021-10-24: qty 2
  Filled 2021-10-24: qty 4
  Filled 2021-10-24 (×2): qty 2

## 2021-10-24 MED ORDER — ISOSORBIDE MONONITRATE ER 30 MG PO TB24
30.0000 mg | ORAL_TABLET | Freq: Every day | ORAL | Status: DC
  Administered 2021-10-24 – 2021-10-28 (×5): 30 mg via ORAL
  Filled 2021-10-24 (×5): qty 1

## 2021-10-24 MED ORDER — SODIUM CHLORIDE 0.9 % IV SOLN
2.0000 g | Freq: Once | INTRAVENOUS | Status: AC
  Administered 2021-10-24: 2 g via INTRAVENOUS
  Filled 2021-10-24: qty 2

## 2021-10-24 MED ORDER — SODIUM CHLORIDE 0.9% FLUSH
3.0000 mL | Freq: Two times a day (BID) | INTRAVENOUS | Status: DC
  Administered 2021-10-24 – 2021-10-28 (×8): 3 mL via INTRAVENOUS

## 2021-10-24 NOTE — H&P (Addendum)
History and Physical    Howard [name] BLT:[id number] DOB: Oct 08, 1931 DOA: 10/24/2021  PCP: Jerrol Banana., MD   Patient coming from: Home  I have personally briefly reviewed patient's old medical records in [place]  Chief Complaint: Sudden onset shortness of breath  HPI: Howard [name] is a 87 y.o. male with medical history significant of HFrEF, diabetes, gastric reflux, hypertension, hyperlipidemia, non-small cell lung cancer s/p radiation, chronic respiratory failure with emphysema, on 3 L of oxygen at home, CAD, presents to the emergency department with complaint of sudden onset shortness of breath.  Per patient he was at his usual state of health until last night, slept well and when he woke up this morning he went to the other part of his house and suddenly developed dyspnea.  Denies any chest pain.  No orthopnea PND.  No recent illnesses.  No fever or chills. Patient had poor appetite and p.o. intake since his radiation.  Last radiation was approximately 15-month ago.  Recently saw his radiation oncologist and there was some concern of depression as there was no reported disease progression at that time and he seems stable. He was placed on CPAP by EMS which was transitioned to BiPAP with significant improvement in his respiratory status in ED.  Patient was very hard of hearing and unable to complete full sentences with BiPAP mask on.  Most of the history was given by his wife at bedside.  ED Course: Patient was found to be hypothermic with no tachycardia or tachypnea,  Review of Systems: As per HPI otherwise 10 point review of systems negative.  Labs pertinent for elevated troponin at 70>>180, BNP elevated at 4227, COVID-19 negative, procalcitonin less than 0.10 and lactic acid of 3.1.  Pending blood cultures. Chest x-ray with a new hazy airspace disease in right mid and lower lung concerning for pneumonia. CTA was done for hypoxia which was negative for PE but did show  new patchy groundglass densities with areas of nodular consolidation in the right upper, middle and lower lobes, with inter and intralobular septal thickening.  Differential consideration include multifocal pneumonia, asymmetric pulmonary edema, and pulmonary hemorrhage.  A new small right and trace left pleural effusions.  There is a new 9 x 8 mm spiculated density in the right upper lobe presumably related to underlying acute process in the right lung.  Aortic atherosclerosis and emphysema.  Patient received cefepime and vancomycin for concern of pneumonia.  He also received IV Lasix. Cardiology and pulmonology was consulted. Not continuing antibiotics at this time due to negative procalcitonin.  Addendum.  Pulmonology taking that it might be a mucous plugging, no overt hemoptysis so they are recommending restarting Eliquis and aspirin.  They are also recommending continuing of cefepime due to confusing CT scan picture.  Ordered some more work-up.  Past Medical History:  Diagnosis Date   Arthritis    Bell palsy    Bell's palsy 04/12/2015   Cancer [facility])    prostate and skin   Chronic combined systolic and diastolic CHF, NYHA class 1 ([place])    a. 07/2014 Echo: EF 35-40%, Gr 1 DD.   Complete heart block ([place])    a. 11/2010 s/p SJM 2210 [facility] DC PPM, ser# [id number].   Depression    Diabetes mellitus without complication [facility])    Fall 11-10-14   GERD (gastroesophageal reflux disease)    History of prostate cancer    Hyperlipidemia    Hypertension    LBBB (left  bundle branch block)    Left-sided Bell's palsy    Lung cancer ([place]) 2016   NICM (nonischemic cardiomyopathy) ([place])    a. 07/2014 Echo: EF 35-40%, mid-apicalanteroseptal DK, Gr 1 DD, mild-mod dil LA.   Non-obstructive CAD    a. 07/2014 Abnl MV;  b. 08/2014 Cath: LM nl, LAD 30p, RI 40p, LCX nl, OM1 40, RCA dominant 30p, 70d-->Med Rx.   Poor balance    Presence of permanent cardiac pacemaker    Sleep apnea    a. cpap   Vertigo    WPW  (Wolff-Parkinson-White syndrome)    a. S/P RFCA 1991.    Past Surgical History:  Procedure Laterality Date   ABDOMINAL AORTIC ENDOVASCULAR STENT GRAFT  08/25/2019   Procedure: ABDOMINAL AORTIC ENDOVASCULAR STENT GRAFT;  Surgeon: Algernon Huxley, MD;  Location: [facility] ORS;  Service: Vascular;;   ANGIOPLASTY Left 08/25/2019   Procedure: ANGIOPLASTY;  Surgeon: Algernon Huxley, MD;  Location: [facility] ORS;  Service: Vascular;  Laterality: Left;  left SFA and stent placement   APPLICATION OF WOUND VAC Left 06/07/2015   Procedure: APPLICATION OF WOUND VAC;  Surgeon: Robert Bellow, MD;  Location: [facility] ORS;  Service: General;  Laterality: Left;  left upper back   [place]  08/26/2014   Single vessel obstructive CAD   CARPAL TUNNEL RELEASE  04-04-15   [facility]   CATARACT EXTRACTION  07-31-11 and 09-18-11   Catheter ablation  1991   for WPW   cervical fusion     CHOLECYSTECTOMY  09-07-14   ENDARTERECTOMY FEMORAL Left 08/25/2019   Procedure: ENDARTERECTOMY FEMORAL;  Surgeon: Algernon Huxley, MD;  Location: [facility] ORS;  Service: Vascular;  Laterality: Left;  common and produndis    ENDOVASCULAR REPAIR/STENT GRAFT Right 08/25/2019   Procedure: ENDOVASCULAR REPAIR/STENT GRAFT;  Surgeon: Algernon Huxley, MD;  Location: [facility] ORS;  Service: Vascular;  Laterality: Right;  renal artery   HAND SURGERY     right 1993; left 2005   [place] / REPLACE / REMOVE PACEMAKER     INSERTION OF ILIAC STENT Bilateral 08/25/2019   Procedure: INSERTION OF ILIAC STENT;  Surgeon: Algernon Huxley, MD;  Location: [facility] ORS;  Service: Vascular;  Laterality: Bilateral;   JOINT REPLACEMENT Left 2013   knee   JOINT REPLACEMENT Right 2004   knee   KNEE SURGERY     left knee 1991 and 1992; right knee [place] N/A 08/26/2014   Procedure: LEFT HEART CATHETERIZATION WITH CORONARY ANGIOGRAM;  Surgeon: [name], MD;  Location: [facility] C[facility] LAB;  Service:  Cardiovascular;  Laterality: N/A;   LOWER EXTREMITY ANGIOGRAPHY Left 08/23/2019   Procedure: Lower Extremity Angiography;  Surgeon: Algernon Huxley, MD;  Location: [place] CV LAB;  Service: Cardiovascular;  Laterality: Left;   LUMBAR LAMINECTOMY/DECOMPRESSION MICRODISCECTOMY N/A 06/07/2014   Procedure: LUMBAR FOUR TO FIVE LUMBAR LAMINECTOMY/DECOMPRESSION MICRODISCECTOMY 1 LEVEL;  Surgeon: [name], MD;  Location: [place] NEURO ORS;  Service: Neurosurgery;  Laterality: N/A;   LUNG BIOPSY Right 2016   Dr [name]   MOHS SURGERY     PACEMAKER INSERTION     PPM-- St Jude 11/30/10 by [name]   PPM GENERATOR CHANGEOUT N/A 07/09/2019   Procedure: PPM GENERATOR CHANGEOUT;  Surgeon: Deboraha Sprang, MD;  Location: [place] CV LAB;  Service: Cardiovascular;  Laterality: N/A;   PROSTATE SURGERY  cancer--1998, prostatectomy   REPLACEMENT TOTAL KNEE     2004   RIGHT/LEFT HEART CATH AND CORONARY ANGIOGRAPHY Bilateral 04/13/2021   Procedure: RIGHT/LEFT HEART CATH AND CORONARY ANGIOGRAPHY;  Surgeon: Wellington Hampshire, MD;  Location: [place] CV LAB;  Service: Cardiovascular;  Laterality: Bilateral;   ruptured disc     1962 and 1998   TEE WITHOUT CARDIOVERSION N/A 09/01/2019   Procedure: TRANSESOPHAGEAL ECHOCARDIOGRAM (TEE);  Surgeon: [name], MD;  Location: [facility] ORS;  Service: Cardiovascular;  Laterality: N/A;   TEMPORARY PACEMAKER N/A 07/09/2019   Procedure: TEMPORARY PACEMAKER;  Surgeon: Deboraha Sprang, MD;  Location: [place] CV LAB;  Service: Cardiovascular;  Laterality: N/A;   TRIGGER FINGER RELEASE  01-24-15   WOUND DEBRIDEMENT Left 06/07/2015   Procedure: DEBRIDEMENT WOUND;  Surgeon: Robert Bellow, MD;  Location: [facility] ORS;  Service: General;  Laterality: Left;  left upper back     reports that he quit smoking about 11 years ago. His smoking use included cigarettes. He has a 56.00 pack-year smoking history. He has never used smokeless tobacco. He reports that he does not drink alcohol  and does not use drugs.  Allergies  Allergen Reactions   Sulfa Antibiotics Rash    Family History  Problem Relation Age of Onset   Heart attack Mother    Hyperlipidemia Mother    CAD Other    Prostate cancer Neg Hx    Cancer Neg Hx     Prior to Admission medications   Medication Sig Start Date End Date Taking? Authorizing Provider  acetaminophen (TYLENOL) 500 MG tablet Take 1,000 mg by mouth every 6 (six) hours as needed for moderate pain.   Yes [provider]  albuterol (VENTOLIN HFA) 108 (90 Base) MCG/ACT inhaler INHALE 2 PUFFS INTO LUNGS EVERY 6 HOURS AS NEEDED FOR WHEEZING OR SHORTNESS OF BREATH 07/23/21  Yes Tyler Pita, MD  aspirin EC 81 MG tablet Take 81 mg by mouth daily.   Yes [provider]  cetirizine (ZYRTEC) 10 MG tablet Take 10 mg by mouth daily.   Yes [provider]  Cholecalciferol (VITAMIN D3) 1000 UNITS CAPS Take 1,000 Units by mouth daily.   Yes [provider]  ELIQUIS 5 MG TABS tablet TAKE 1 TABLET TWICE A DAY  (SWITCHED FROM PLAVIX) Patient taking differently: Take 5 mg by mouth 2 (two) times daily. 08/28/21  Yes Algernon Huxley, MD  ezetimibe (ZETIA) 10 MG tablet TAKE 1 TABLET DAILY 09/24/21  Yes Gollan, Kathlene November, MD  Fluticasone-Umeclidin-Vilant (TRELEGY ELLIPTA) 100-62.5-25 MCG/INH AEPB Inhale 1 puff into the lungs daily. 02/14/21  Yes Tyler Pita, MD  furosemide (LASIX) 20 MG tablet TAKE 1 TABLET BY MOUTH ONCE DAILY 10/09/21  Yes Gollan, Kathlene November, MD  Iron-Vitamin C (VITRON-C) 65-125 MG TABS Take 1 tablet by mouth daily. 04/16/21  Yes Earlie Server, MD  isosorbide mononitrate (IMDUR) 30 MG 24 hr tablet TAKE 1 TABLET DAILY 02/12/21  Yes Gollan, Kathlene November, MD  megestrol (MEGACE) 40 MG tablet Take 1 tablet (40 mg total) by mouth 2 (two) times daily. 09/18/21  Yes Earlie Server, MD  metFORMIN (GLUCOPHAGE) 500 MG tablet Take 1 tablet (500 mg total) by mouth 2 (two) times daily with a meal. 10/23/21  Yes Jerrol Banana., MD   metoprolol succinate (TOPROL-XL) 25 MG 24 hr tablet TAKE 1 TABLET AT BEDTIME 09/24/21  Yes Gollan, Kathlene November, MD  montelukast (SINGULAIR) 10 MG tablet Take 10 mg by mouth  daily as needed (sneezing/allergies.).   Yes [provider]  Multiple Vitamin (MULTIVITAMIN WITH MINERALS) TABS tablet Take 1 tablet by mouth daily. One-A-Day Multivitamin   Yes [provider]  omeprazole (PRILOSEC) 40 MG capsule TAKE 1 CAPSULE TWICE DAILY AS NEEDED 02/12/21  Yes Chrismon, [name], PA-C  sacubitril-valsartan (ENTRESTO) 49-51 MG Take 1 tablet by mouth 2 (two) times daily. 07/25/21 11/22/21 Yes [name], MD  simvastatin (ZOCOR) 40 MG tablet TAKE 1 TABLET AT BEDTIME 03/23/21  Yes Gollan, Kathlene November, MD  spironolactone (ALDACTONE) 25 MG tablet Take 1 tablet (25 mg total) by mouth daily. 10/02/21 09/27/22 Yes Gollan, Kathlene November, MD  donepezil (ARICEPT) 5 MG tablet Take 1 tablet (5 mg total) by mouth daily. 10/23/21   Jerrol Banana., MD  promethazine-dextromethorphan (PROMETHAZINE-DM) 6.25-15 MG/5ML syrup Take 5 mLs by mouth 3 (three) times daily as needed for cough. Patient not taking: No sig reported 07/01/21   Laurene Footman B, PA-C  sertraline (ZOLOFT) 50 MG tablet Take 1 tablet (50 mg total) by mouth daily. 10/23/21   Jerrol Banana., MD    Physical Exam: Vitals:   10/24/21 0930 10/24/21 1130 10/24/21 1200 10/24/21 1300  BP: (!) 141/92 (!) 162/79 (!) 145/79 139/82  Pulse: 91 (!) 104 96 84  Resp: (!) 29 (!) 35 (!) 37 (!) 30  Temp:      TempSrc:      SpO2: 95% 100% 95% 99%  Weight:        General: Vital signs reviewed.  Patient is well-developed, in no acute distress and cooperative with exam.  Head: Normocephalic and atraumatic. Eyes: EOMI, conjunctivae normal, no scleral icterus.  ENMT: Mucous membranes are moist.  Neck: Supple, trachea midline,  Cardiovascular: RRR, S1 normal, S2 normal,  Pulmonary/Chest: Few basal crackles bilaterally, no wheezes, rales, or  rhonchi. Abdominal: Soft, non-tender, non-distended, BS +, Extremities: No lower extremity edema bilaterally,  pulses symmetric and intact bilaterally.  Neurological: A&O x3, Strength is normal and symmetric bilaterally, cranial nerve II-XII are grossly intact, no focal motor deficit, sensory intact to light touch bilaterally.  Psychiatric: Normal mood and affect.   Labs on Admission: I have personally reviewed following labs and imaging studies  CBC: Recent Labs  Lab 10/24/21 0827  WBC 9.7  HGB 12.6*  HCT 39.3  MCV 85.4  PLT 622   Basic Metabolic Panel: Recent Labs  Lab 10/24/21 0827  NA 139  K 4.1  CL 110  CO2 18*  GLUCOSE 241*  BUN 25*  CREATININE 1.27*  CALCIUM 9.1   GFR: Estimated Creatinine Clearance: 44.4 mL/min (A) (by C-G formula based on SCr of 1.27 mg/dL (H)). Liver Function Tests: Recent Labs  Lab 10/24/21 0827  AST 21  ALT 15  ALKPHOS 49  BILITOT 0.8  PROT 7.2  ALBUMIN 3.8   No results for input(s): LIPASE, AMYLASE in the last 168 hours. No results for input(s): AMMONIA in the last 168 hours. Coagulation Profile: No results for input(s): INR, PROTIME in the last 168 hours. Cardiac Enzymes: No results for input(s): CKTOTAL, CKMB, CKMBINDEX, TROPONINI in the last 168 hours. BNP (last 3 results) No results for input(s): PROBNP in the last 8760 hours. HbA1C: No results for input(s): HGBA1C in the last 72 hours. CBG: No results for input(s): GLUCAP in the last 168 hours. Lipid Profile: No results for input(s): CHOL, HDL, LDLCALC, TRIG, CHOLHDL, LDLDIRECT in the last 72 hours. Thyroid Function Tests: No results for input(s): TSH, T4TOTAL, FREET4,  T3FREE, THYROIDAB in the last 72 hours. Anemia Panel: No results for input(s): VITAMINB12, FOLATE, FERRITIN, TIBC, IRON, RETICCTPCT in the last 72 hours. Urine analysis:    Component Value Date/Time   COLORURINE YELLOW (A) 08/30/2019 1718   APPEARANCEUR CLEAR (A) 08/30/2019 1718   APPEARANCEUR Clear  05/07/2016 1026   LABSPEC 1.029 08/30/2019 1718   LABSPEC 1.015 11/11/2014 0450   PHURINE 6.0 08/30/2019 1718   GLUCOSEU NEGATIVE 08/30/2019 1718   GLUCOSEU Negative 11/11/2014 0450   HGBUR NEGATIVE 08/30/2019 1718   BILIRUBINUR NEGATIVE 08/30/2019 1718   BILIRUBINUR Negative 05/07/2016 1026   BILIRUBINUR Negative 11/11/2014 0450   KETONESUR 20 (A) 08/30/2019 1718   PROTEINUR NEGATIVE 08/30/2019 1718   UROBILINOGEN 0.2 04/18/2016 1057   UROBILINOGEN 0.2 06/15/2010 0942   NITRITE NEGATIVE 08/30/2019 1718   LEUKOCYTESUR NEGATIVE 08/30/2019 1718   LEUKOCYTESUR Negative 11/11/2014 0450    Radiological Exams on Admission: CT Angio Chest PE W and/or Wo Contrast  Result Date: 10/24/2021 CLINICAL DATA:  Acute onset shortness of breath. History of lung cancer. EXAM: CT ANGIOGRAPHY CHEST WITH CONTRAST TECHNIQUE: Multidetector CT imaging of the chest was performed using the standard protocol during bolus administration of intravenous contrast. Multiplanar CT image reconstructions and MIPs were obtained to evaluate the vascular anatomy. CONTRAST:  80mL OMNIPAQUE IOHEXOL 350 MG/ML SOLN COMPARISON:  CT chest dated September 12, 2021. FINDINGS: Cardiovascular: Satisfactory opacification of the pulmonary arteries to the segmental level. No evidence of pulmonary embolism. Unchanged mild cardiomegaly. No pericardial effusion. No thoracic aortic aneurysm. Coronary, aortic arch, and branch vessel atherosclerotic vascular disease. Mediastinum/Nodes: No enlarged mediastinal, hilar, or axillary lymph nodes. Unchanged index subcarinal lymph node measuring 1.1 cm in short axis. Thyroid gland, trachea, and esophagus demonstrate no significant findings. Lungs/Pleura: New patchy ground-glass densities with areas of nodular consolidation in the right upper, right middle, and right lower lobes, with inter- and intralobular septal thickening. Post treatment related dense fibrotic consolidation bronchiectasis in the medial  right upper and lower lobes has progressed. New small right and trace left pleural effusions. No pneumothorax. Unchanged calcified scarring in the lingula and medial right middle lobe. Mild dependent subsegmental atelectasis in the posterior left lower lobe. Mild emphysema. New 9 x 8 mm spiculated density in the right upper lobe (series 6, image 22). Upper Abdomen: No acute abnormality. Musculoskeletal: No chest wall abnormality. No acute or significant osseous findings. Review of the MIP images confirms the above findings. IMPRESSION: 1. No evidence of pulmonary embolism. 2. New patchy ground-glass densities with areas of nodular consolidation in the right upper, right middle, and right lower lobes, with inter- and intralobular septal thickening. Differential considerations include multifocal pneumonia, asymmetric pulmonary edema, and pulmonary hemorrhage. 3. New small right and trace left pleural effusions. 4. New 9 x 8 mm spiculated density in the right upper lobe, presumably related to underlying acute process in the right lung. However, attention on follow-up imaging is recommended. 5. Aortic Atherosclerosis (ICD10-I70.0) and Emphysema (ICD10-J43.9). Electronically Signed   By: Titus Dubin M.D.   On: 10/24/2021 11:14   DG Chest Portable 1 View  Result Date: 10/24/2021 CLINICAL DATA:  Shortness of breath. EXAM: PORTABLE CHEST 1 VIEW COMPARISON:  CT chest dated September 12, 2021. Chest x-ray dated July 03, 2021. FINDINGS: Left chest wall pacemaker again noted. Unchanged mild cardiomegaly. New hazy airspace disease in the right mid and lower lung. Unchanged scarring in the left lung base and post treatment changes in the medial right lower lobe. No pleural effusion or  pneumothorax. No acute osseous abnormality. IMPRESSION: 1. New hazy airspace disease in the right mid and lower lung concerning for pneumonia. Electronically Signed   By: Titus Dubin M.D.   On: 10/24/2021 08:54    EKG: Independently  reviewed.  Paced rhythm with wide QRS complexes and tachycardia.  Assessment/Plan Active Problems:   Acute on chronic HFrEF (heart failure with reduced ejection fraction) (HCC)   Acute on chronic hypoxic respiratory failure.  Currently on BiPAP, uses 3 L of oxygen at baseline.  Differential at this time is acute on chronic HFrEF, CT concerning for new opacities which can be pulmonary edema but multifocal pneumonia and hemorrhage cannot be ruled out.  No overt hemoptysis, had some pinkish sputum earlier in the morning.  Procalcitonin negative. -Continue with BiPAP and wean to baseline oxygen as tolerated. -Pulmonology consult. -Continue with supportive care -Holding home dose of Eliquis and aspirin until cleared from pulmonology if there is any concern of pulmonary hemorrhage.  Acute on chronic HFrEF.  EF of 20 to 25% according to echo done in August 2022.  Mildly elevated troponin which can be due to demand.  No lower extremity edema.  Received IV Lasix in ED Markedly elevated BNP. -Continue with IV Lasix. -Strict intake and output -Daily weight and BMP -Cardiology consult-patient is an established patient of Dr. [name], Dr. Mylo Red was notified. -Continue home dose of Imdur, metoprolol and Entresto.  Elevated troponin/history of CAD .70>>180.  Still can be due to demand ischemia. -Repeat echocardiogram. -Cardiology was consulted-will appreciate their recommendations -Continue to trend troponin -Holding home aspirin for concern of some hemoptysis. -Continue beta-blocker and statin  Concern of pneumonia.  Patient is afebrile, no leukocytosis and procalcitonin negative which makes it less likely.  Received cefepime and vancomycin. -Holding antibiotics for now.  Lactic acidosis.  Initial labs with lactic acid of 3.1.  Most likely secondary to hypoxia.  Patient was also on metformin.  Less likely infectious etiology. -Continue to monitor  History of non-small cell lung cancer.  S/p  radiation, completed the course approximately 36-month ago. -Outpatient oncology follow-up  Hypertension. -Continue home meds as mentioned above.  Type 2 diabetes mellitus.  On metformin at home. -Hold metformin as he has lactic acidosis. -Check A1c -Moderate scale SSI  Concern of depression/underlying dementia.  He was recently started on Zoloft which he has not taken yet. -Continue Aricept   DVT prophylaxis: SCDs, can resume home Eliquis once cleared from pulmonology. Code Status: Full code confirmed with wife and patient, explained to them that it might not be fruitful based on his age, low EF and other comorbidities. Family Communication: Discussed with wife at bedside Disposition Plan: To be determined Consults called: Cardiology Admission status: Inpatient   Lorella Nimrod MD [facility]  If 7PM-7AM, please contact night-coverage [email or web address]  10/24/2021, 2:28 PM   This record has been created using Systems analyst. Errors have been sought and corrected,but may not always be located. Such creation errors do not reflect on the standard of care.

## 2021-10-24 NOTE — ED Triage Notes (Signed)
Pt comes via [facility] from home.  EMS states pt had rapid onset of SOB. EMS also reports pt was coughing up pinky, frothy sputum and crackles throughout upon auscultation. Pt has hx of lung cancer.  Prior to EMS arrival, pt O2 sat 90% on 6L. EMS placed pt on CPAP and o2 sat was 93%

## 2021-10-24 NOTE — Progress Notes (Signed)
PHARMACY -  BRIEF ANTIBIOTIC NOTE   Pharmacy has received consult(s) for vancomycin and cefepime from an ED provider.  The patient's profile has been reviewed for ht/wt/allergies/indication/available labs.    One time order(s) placed for: Cefepime 2 g Vancomycin 2 g (1 g followed by 1 g)  Further antibiotics/pharmacy consults should be ordered by admitting physician if indicated.                       Thank you, Howard [name] 10/24/2021  9:38 AM

## 2021-10-24 NOTE — ED Notes (Signed)
Agbor-Etang MD at bedside

## 2021-10-24 NOTE — Consult Note (Signed)
Cardiology Consultation:   Patient ID: Howard [name] MRN: [id number]; DOB: 1931-11-16  Admit date: 10/24/2021 Date of Consult: 10/24/2021  PCP:  Jerrol Banana., MD   [facility] Providers Cardiologist:  [name], MD  Electrophysiologist:  [name], MD       Patient Profile:   Howard [name] is a 86 y.o. male with a hx of CHF, CHB/PPM, lung cancer who is being seen 10/24/2021 for the evaluation of shortness of breath at the request of Dr. Reesa Chew.  History of Present Illness:   Howard [name] is an 87 year old gentleman with history of an ICM EF 20 to 25%, complete heart block s/p permanent pacemaker, lung cancer s/p radiation therapy, chronic hypoxemia on 3 L home oxygen, OSA on CPAP presenting with acute onset shortness of breath and cough.  Patient was at home when he developed acute shortness of breath.  Also having cough producing blood-tinged sputum.  He tried using his albuterol inhaler but symptoms got worse.  His wife called EMS and patient brought to the hospital.  States feeling fine yesterday, denies fevers, chills, edema.  Upon admission, work-up with chest x-ray showed right middle and lower lobe infiltration, chest CT showed consolidations in the right upper middle and lower lobes consistent with multifocal pneumonia.  Spiculated density noted in the right upper lobe.  Patient started on BiPAP, IV antibiotics.  EKG showed V paced rhythm.  BNP was 4227, troponin 70.   Past Medical History:  Diagnosis Date   Arthritis    Bell palsy    Bell's palsy 04/12/2015   Cancer [facility])    prostate and skin   Chronic combined systolic and diastolic CHF, NYHA class 1 ([place])    a. 07/2014 Echo: EF 35-40%, Gr 1 DD.   Complete heart block ([place])    a. 11/2010 s/p SJM 2210 [facility] DC PPM, ser# [id number].   Depression    Diabetes mellitus without complication ([place])    Fall 11-10-14   GERD (gastroesophageal reflux disease)    History of prostate cancer    Hyperlipidemia     Hypertension    LBBB (left bundle branch block)    Left-sided Bell's palsy    Lung cancer ([place]) 2016   NICM (nonischemic cardiomyopathy) ([place])    a. 07/2014 Echo: EF 35-40%, mid-apicalanteroseptal DK, Gr 1 DD, mild-mod dil LA.   Non-obstructive CAD    a. 07/2014 Abnl MV;  b. 08/2014 Cath: LM nl, LAD 30p, RI 40p, LCX nl, OM1 40, RCA dominant 30p, 70d-->Med Rx.   Poor balance    Presence of permanent cardiac pacemaker    Sleep apnea    a. cpap   Vertigo    WPW (Wolff-Parkinson-White syndrome)    a. S/P RFCA 1991.    Past Surgical History:  Procedure Laterality Date   ABDOMINAL AORTIC ENDOVASCULAR STENT GRAFT  08/25/2019   Procedure: ABDOMINAL AORTIC ENDOVASCULAR STENT GRAFT;  Surgeon: Algernon Huxley, MD;  Location: [facility] ORS;  Service: Vascular;;   ANGIOPLASTY Left 08/25/2019   Procedure: ANGIOPLASTY;  Surgeon: Algernon Huxley, MD;  Location: [facility] ORS;  Service: Vascular;  Laterality: Left;  left SFA and stent placement   APPLICATION OF WOUND VAC Left 06/07/2015   Procedure: APPLICATION OF WOUND VAC;  Surgeon: Robert Bellow, MD;  Location: [facility] ORS;  Service: General;  Laterality: Left;  left upper back   [place]  08/26/2014   Single vessel obstructive CAD  CARPAL TUNNEL RELEASE  04-04-15   [facility]   CATARACT EXTRACTION  07-31-11 and 09-18-11   Catheter ablation  1991   for WPW   cervical fusion     CHOLECYSTECTOMY  09-07-14   ENDARTERECTOMY FEMORAL Left 08/25/2019   Procedure: ENDARTERECTOMY FEMORAL;  Surgeon: Algernon Huxley, MD;  Location: [facility] ORS;  Service: Vascular;  Laterality: Left;  common and produndis    ENDOVASCULAR REPAIR/STENT GRAFT Right 08/25/2019   Procedure: ENDOVASCULAR REPAIR/STENT GRAFT;  Surgeon: Algernon Huxley, MD;  Location: [facility] ORS;  Service: Vascular;  Laterality: Right;  renal artery   HAND SURGERY     right 1993; left 2005   [place] / REPLACE / REMOVE PACEMAKER     INSERTION OF ILIAC STENT Bilateral 08/25/2019    Procedure: INSERTION OF ILIAC STENT;  Surgeon: Algernon Huxley, MD;  Location: [facility] ORS;  Service: Vascular;  Laterality: Bilateral;   JOINT REPLACEMENT Left 2013   knee   JOINT REPLACEMENT Right 2004   knee   KNEE SURGERY     left knee 1991 and 1992; right knee [place] N/A 08/26/2014   Procedure: LEFT HEART CATHETERIZATION WITH CORONARY ANGIOGRAM;  Surgeon: [name], MD;  Location: [facility] CATH LAB;  Service: Cardiovascular;  Laterality: N/A;   LOWER EXTREMITY ANGIOGRAPHY Left 08/23/2019   Procedure: Lower Extremity Angiography;  Surgeon: Algernon Huxley, MD;  Location: [place] CV LAB;  Service: Cardiovascular;  Laterality: Left;   LUMBAR LAMINECTOMY/DECOMPRESSION MICRODISCECTOMY N/A 06/07/2014   Procedure: LUMBAR FOUR TO FIVE LUMBAR LAMINECTOMY/DECOMPRESSION MICRODISCECTOMY 1 LEVEL;  Surgeon: [name], MD;  Location: [place] [facility] ORS;  Service: Neurosurgery;  Laterality: N/A;   LUNG BIOPSY Right 2016   Dr [name]   MOHS SURGERY     PACEMAKER INSERTION     PPM-- St Jude 11/30/10 by [name]   PPM GENERATOR CHANGEOUT N/A 07/09/2019   Procedure: PPM GENERATOR CHANGEOUT;  Surgeon: Deboraha Sprang, MD;  Location: [place] CV LAB;  Service: Cardiovascular;  Laterality: N/A;   PROSTATE SURGERY     cancer--1998, prostatectomy   REPLACEMENT TOTAL KNEE     2004   RIGHT/LEFT HEART CATH AND CORONARY ANGIOGRAPHY Bilateral 04/13/2021   Procedure: RIGHT/LEFT HEART CATH AND CORONARY ANGIOGRAPHY;  Surgeon: Wellington Hampshire, MD;  Location: [place] CV LAB;  Service: Cardiovascular;  Laterality: Bilateral;   ruptured disc     1962 and 1998   TEE WITHOUT CARDIOVERSION N/A 09/01/2019   Procedure: TRANSESOPHAGEAL ECHOCARDIOGRAM (TEE);  Surgeon: [name], MD;  Location: [facility] ORS;  Service: Cardiovascular;  Laterality: N/A;   TEMPORARY PACEMAKER N/A 07/09/2019   Procedure: TEMPORARY PACEMAKER;  Surgeon: Deboraha Sprang, MD;  Location: [place] CV LAB;   Service: Cardiovascular;  Laterality: N/A;   TRIGGER FINGER RELEASE  01-24-15   WOUND DEBRIDEMENT Left 06/07/2015   Procedure: DEBRIDEMENT WOUND;  Surgeon: Robert Bellow, MD;  Location: [facility] ORS;  Service: General;  Laterality: Left;  left upper back     Home Medications:  Prior to Admission medications   Medication Sig Start Date End Date Taking? Authorizing Provider  acetaminophen (TYLENOL) 500 MG tablet Take 1,000 mg by mouth every 6 (six) hours as needed for moderate pain.   Yes [provider]  albuterol (VENTOLIN HFA) 108 (90 Base) MCG/ACT inhaler INHALE 2 PUFFS INTO LUNGS EVERY 6 HOURS AS NEEDED FOR WHEEZING OR SHORTNESS OF BREATH 07/23/21  Yes  Tyler Pita, MD  aspirin EC 81 MG tablet Take 81 mg by mouth daily.   Yes [provider]  cetirizine (ZYRTEC) 10 MG tablet Take 10 mg by mouth daily.   Yes [provider]  Cholecalciferol (VITAMIN D3) 1000 UNITS CAPS Take 1,000 Units by mouth daily.   Yes [provider]  ELIQUIS 5 MG TABS tablet TAKE 1 TABLET TWICE A DAY  (SWITCHED FROM PLAVIX) Patient taking differently: Take 5 mg by mouth 2 (two) times daily. 08/28/21  Yes Algernon Huxley, MD  ezetimibe (ZETIA) 10 MG tablet TAKE 1 TABLET DAILY 09/24/21  Yes Gollan, Kathlene November, MD  Fluticasone-Umeclidin-Vilant (TRELEGY ELLIPTA) 100-62.5-25 MCG/INH AEPB Inhale 1 puff into the lungs daily. 02/14/21  Yes Tyler Pita, MD  furosemide (LASIX) 20 MG tablet TAKE 1 TABLET BY MOUTH ONCE DAILY 10/09/21  Yes Gollan, Kathlene November, MD  Iron-Vitamin C (VITRON-C) 65-125 MG TABS Take 1 tablet by mouth daily. 04/16/21  Yes Earlie Server, MD  isosorbide mononitrate (IMDUR) 30 MG 24 hr tablet TAKE 1 TABLET DAILY 02/12/21  Yes Gollan, Kathlene November, MD  megestrol (MEGACE) 40 MG tablet Take 1 tablet (40 mg total) by mouth 2 (two) times daily. 09/18/21  Yes Earlie Server, MD  metFORMIN (GLUCOPHAGE) 500 MG tablet Take 1 tablet (500 mg total) by mouth 2 (two) times daily with a meal. 10/23/21  Yes  Jerrol Banana., MD  metoprolol succinate (TOPROL-XL) 25 MG 24 hr tablet TAKE 1 TABLET AT BEDTIME 09/24/21  Yes Gollan, Kathlene November, MD  montelukast (SINGULAIR) 10 MG tablet Take 10 mg by mouth daily as needed (sneezing/allergies.).   Yes [provider]  Multiple Vitamin (MULTIVITAMIN WITH MINERALS) TABS tablet Take 1 tablet by mouth daily. One-A-Day Multivitamin   Yes [provider]  omeprazole (PRILOSEC) 40 MG capsule TAKE 1 CAPSULE TWICE DAILY AS NEEDED 02/12/21  Yes Chrismon, [name], PA-C  sacubitril-valsartan (ENTRESTO) 49-51 MG Take 1 tablet by mouth 2 (two) times daily. 07/25/21 11/22/21 Yes [name], MD  simvastatin (ZOCOR) 40 MG tablet TAKE 1 TABLET AT BEDTIME 03/23/21  Yes Gollan, Kathlene November, MD  spironolactone (ALDACTONE) 25 MG tablet Take 1 tablet (25 mg total) by mouth daily. 10/02/21 09/27/22 Yes Gollan, Kathlene November, MD  donepezil (ARICEPT) 5 MG tablet Take 1 tablet (5 mg total) by mouth daily. 10/23/21   Jerrol Banana., MD  promethazine-dextromethorphan (PROMETHAZINE-DM) 6.25-15 MG/5ML syrup Take 5 mLs by mouth 3 (three) times daily as needed for cough. Patient not taking: No sig reported 07/01/21   Laurene Footman B, PA-C  sertraline (ZOLOFT) 50 MG tablet Take 1 tablet (50 mg total) by mouth daily. 10/23/21   Jerrol Banana., MD    Inpatient Medications: Scheduled Meds:  cholecalciferol  1,000 Units Oral Daily   donepezil  5 mg Oral Daily   ezetimibe  10 mg Oral Daily   fluticasone furoate-vilanterol  1 puff Inhalation Daily   And   umeclidinium bromide  1 puff Inhalation Daily   [START ON 10/25/2021] furosemide  40 mg Intravenous Daily   insulin aspart  0-15 Units Subcutaneous TID WC   insulin aspart  0-5 Units Subcutaneous QHS   isosorbide mononitrate  30 mg Oral Daily   loratadine  10 mg Oral Daily   metoprolol succinate  25 mg Oral QHS   multivitamin with minerals  1 tablet Oral Daily   pantoprazole  40 mg Oral Daily    sacubitril-valsartan  1 tablet  Oral BID   simvastatin  40 mg Oral QHS   sodium chloride flush  3 mL Intravenous Q12H   spironolactone  25 mg Oral Daily   Continuous Infusions:  sodium chloride     PRN Meds: sodium chloride, acetaminophen, montelukast, ondansetron (ZOFRAN) IV, sodium chloride flush  Allergies:    Allergies  Allergen Reactions   Sulfa Antibiotics Rash    Social History:   Social History   Socioeconomic History   Marital status: Married    Spouse name: Not on file   Number of children: 2   Years of education: College   Highest education level: Some college, no degree  Occupational History   Occupation: Retired  Tobacco Use   Smoking status: Former    Packs/day: 1.00    Years: 56.00    Pack years: 56.00    Types: Cigarettes    Quit date: 2011    Years since quitting: 11.8   Smokeless tobacco: Never  Vaping Use   Vaping Use: Never used  Substance and Sexual Activity   Alcohol use: No   Drug use: No   Sexual activity: Not on file  Other Topics Concern   Not on file  Social History Narrative   Drinks 2 cups of coffee a day    Social Determinants of Radio broadcast assistant Strain: Low Risk    Difficulty of Paying Living Expenses: Not hard at all  Food Insecurity: No Food Insecurity   Worried About Charity fundraiser in the Last Year: Never true   Arboriculturist in the Last Year: Never true  Transportation Needs: No Transportation Needs   Lack of Transportation (Medical): No   Lack of Transportation (Non-Medical): No  Physical Activity: Inactive   Days of Exercise per Week: 0 days   Minutes of Exercise per Session: 0 min  Stress: No Stress Concern Present   Feeling of Stress : Not at all  Social Connections: Moderately Isolated   Frequency of Communication with Friends and Family: Twice a week   Frequency of Social Gatherings with Friends and Family: Twice a week   Attends Religious Services: Never   Engineer, materials: No   Attends Music therapist: Never   Marital Status: Married  Human resources officer Violence: Not At Risk   Fear of Current or Ex-Partner: No   Emotionally Abused: No   Physically Abused: No   Sexually Abused: No    Family History:    Family History  Problem Relation Age of Onset   Heart attack Mother    Hyperlipidemia Mother    CAD Other    Prostate cancer Neg Hx    Cancer Neg Hx      ROS:  Please see the history of present illness.   All other ROS reviewed and negative.     Physical Exam/Data:   Vitals:   10/24/21 0930 10/24/21 1130 10/24/21 1200 10/24/21 1300  BP: (!) 141/92 (!) 162/79 (!) 145/79 139/82  Pulse: 91 (!) 104 96 84  Resp: (!) 29 (!) 35 (!) 37 (!) 30  Temp:      TempSrc:      SpO2: 95% 100% 95% 99%  Weight:        Intake/Output Summary (Last 24 hours) at 10/24/2021 1552 Last data filed at 10/24/2021 1229 Gross per 24 hour  Intake 403.1 ml  Output 650 ml  Net -246.9 ml   Last 3 Weights 10/24/2021 10/23/2021 10/17/2021  Weight (lbs) 196 lb 186 lb 184 lb 8 oz  Weight (kg) 88.905 kg 84.369 kg 83.689 kg     Body mass index is 28.94 kg/m.  General: Mild respiratory distress, BiPAP mask on. HEENT: normal Neck: no JVD Vascular: No carotid bruits; Distal pulses 2+ bilaterally Cardiac: Distant heart sounds, tachycardic Lungs: Diminished breath sounds at bases, bilateral rhonchi Abd: soft, nontender, no hepatomegaly  Ext: no edema Musculoskeletal:  No deformities, BUE and BLE strength normal and equal Skin: warm and dry  Neuro:  CNs 2-12 intact, no focal abnormalities noted Psych:  Normal affect   EKG:  The EKG was personally reviewed and demonstrates: V paced rhythm Telemetry:  Telemetry was personally reviewed and demonstrates: V paced rhythm  Relevant CV Studies: Echo 07/2021 1. Left ventricular ejection fraction, by estimation, is 20 to 25%. The  left ventricle has severely decreased function. The left ventricle   demonstrates global hypokinesis. There is moderate left ventricular  hypertrophy. Left ventricular diastolic  parameters are indeterminate.   2. Right ventricular systolic function is normal. The right ventricular  size is normal. Tricuspid regurgitation signal is inadequate for assessing  PA pressure.   3. Left atrial size was mildly dilated.   4. The mitral valve is degenerative. Mild mitral valve regurgitation.   5. The aortic valve is tricuspid. There is moderate calcification of the  aortic valve. There is moderate thickening of the aortic valve. Aortic  valve regurgitation is not visualized. Mild to moderate aortic valve  sclerosis/calcification is present,  without any evidence of aortic stenosis.   Laboratory Data:  High Sensitivity Troponin:   Recent Labs  Lab 10/24/21 0827 10/24/21 1036  TROPONINIHS 70* 180*     Chemistry Recent Labs  Lab 10/24/21 0827  NA 139  K 4.1  CL 110  CO2 18*  GLUCOSE 241*  BUN 25*  CREATININE 1.27*  CALCIUM 9.1  GFRNONAA 54*  ANIONGAP 11    Recent Labs  Lab 10/24/21 0827  PROT 7.2  ALBUMIN 3.8  AST 21  ALT 15  ALKPHOS 49  BILITOT 0.8   Lipids No results for input(s): CHOL, TRIG, HDL, LABVLDL, LDLCALC, CHOLHDL in the last 168 hours.  Hematology Recent Labs  Lab 10/24/21 0827  WBC 9.7  RBC 4.60  HGB 12.6*  HCT 39.3  MCV 85.4  MCH 27.4  MCHC 32.1  RDW 18.7*  PLT 282   Thyroid No results for input(s): TSH, FREET4 in the last 168 hours.  BNP Recent Labs  Lab 10/24/21 0827  BNP 4,227.2*    DDimer No results for input(s): DDIMER in the last 168 hours.   Radiology/Studies:  CT Angio Chest PE W and/or Wo Contrast  Result Date: 10/24/2021 CLINICAL DATA:  Acute onset shortness of breath. History of lung cancer. EXAM: CT ANGIOGRAPHY CHEST WITH CONTRAST TECHNIQUE: Multidetector CT imaging of the chest was performed using the standard protocol during bolus administration of intravenous contrast. Multiplanar CT image  reconstructions and MIPs were obtained to evaluate the vascular anatomy. CONTRAST:  78mL OMNIPAQUE IOHEXOL 350 MG/ML SOLN COMPARISON:  CT chest dated September 12, 2021. FINDINGS: Cardiovascular: Satisfactory opacification of the pulmonary arteries to the segmental level. No evidence of pulmonary embolism. Unchanged mild cardiomegaly. No pericardial effusion. No thoracic aortic aneurysm. Coronary, aortic arch, and branch vessel atherosclerotic vascular disease. Mediastinum/Nodes: No enlarged mediastinal, hilar, or axillary lymph nodes. Unchanged index subcarinal lymph node measuring 1.1 cm in short axis. Thyroid gland, trachea, and esophagus demonstrate no significant findings. Lungs/Pleura: New  patchy ground-glass densities with areas of nodular consolidation in the right upper, right middle, and right lower lobes, with inter- and intralobular septal thickening. Post treatment related dense fibrotic consolidation bronchiectasis in the medial right upper and lower lobes has progressed. New small right and trace left pleural effusions. No pneumothorax. Unchanged calcified scarring in the lingula and medial right middle lobe. Mild dependent subsegmental atelectasis in the posterior left lower lobe. Mild emphysema. New 9 x 8 mm spiculated density in the right upper lobe (series 6, image 22). Upper Abdomen: No acute abnormality. Musculoskeletal: No chest wall abnormality. No acute or significant osseous findings. Review of the MIP images confirms the above findings. IMPRESSION: 1. No evidence of pulmonary embolism. 2. New patchy ground-glass densities with areas of nodular consolidation in the right upper, right middle, and right lower lobes, with inter- and intralobular septal thickening. Differential considerations include multifocal pneumonia, asymmetric pulmonary edema, and pulmonary hemorrhage. 3. New small right and trace left pleural effusions. 4. New 9 x 8 mm spiculated density in the right upper lobe,  presumably related to underlying acute process in the right lung. However, attention on follow-up imaging is recommended. 5. Aortic Atherosclerosis (ICD10-I70.0) and Emphysema (ICD10-J43.9). Electronically Signed   By: Titus Dubin M.D.   On: 10/24/2021 11:14   DG Chest Portable 1 View  Result Date: 10/24/2021 CLINICAL DATA:  Shortness of breath. EXAM: PORTABLE CHEST 1 VIEW COMPARISON:  CT chest dated September 12, 2021. Chest x-ray dated July 03, 2021. FINDINGS: Left chest wall pacemaker again noted. Unchanged mild cardiomegaly. New hazy airspace disease in the right mid and lower lung. Unchanged scarring in the left lung base and post treatment changes in the medial right lower lobe. No pleural effusion or pneumothorax. No acute osseous abnormality. IMPRESSION: 1. New hazy airspace disease in the right mid and lower lung concerning for pneumonia. Electronically Signed   By: Titus Dubin M.D.   On: 10/24/2021 08:54     Assessment and Plan:   Shortness of breath, pneumonia -Etiology likely pulmonary in light of pneumonia -CPAP support, IV antibiotics, inhalers as per pulmonary medicine.  2.  Cardiomyopathy, EF 20 to 25% -Appears euvolemic -IV Lasix 40 mg daily -PTA Toprol-XL, Entresto, Aldactone.  3.  CHB s/p permanent pacemaker -Pacemaker appears to be functioning normally  Symptoms of acute shortness of breath appear to be secondary to pneumonia/respiratory failure.  Management of respiratory failure as per pulmonary medicine.  Restart chronic cardiac medications as BP permits.  Total encounter time 110 minutes  Greater than 50% was spent in counseling and coordination of care with the patient   Signed, Kate Sable, MD  10/24/2021 3:52 PM

## 2021-10-24 NOTE — Consult Note (Signed)
Pharmacy Antibiotic Note  [name] is a 87 y.o. male admitted on 10/24/2021 with pneumonia.  Pharmacy has been consulted for Cefepime dosing. Patient received 1 dose of Vancomycin in ED, as well.  Plan: Cefepime 2g IV Q12 hours  Weight: 88.9 kg (196 lb)  Temp (24hrs), Avg:97.5 F (36.4 C), Min:97.5 F (36.4 C), Max:97.5 F (36.4 C)  Recent Labs  Lab 10/24/21 0827 10/24/21 1435  WBC 9.7  --   CREATININE 1.27*  --   LATICACIDVEN 3.1* 2.2*    Estimated Creatinine Clearance: 44.4 mL/min (A) (by C-G formula based on SCr of 1.27 mg/dL (H)).    Allergies  Allergen Reactions   Sulfa Antibiotics Rash    Antimicrobials this admission: Vancomycin 11/2 x 1 Cefepime 11/2 >>   Microbiology results: 11/2 BCx: pending 11/2 MRSA PCR: negative  Thank you for allowing pharmacy to be a part of this patient's care.  Howard [name] 10/24/2021 4:19 PM

## 2021-10-24 NOTE — Consult Note (Signed)
Pulmonary Medicine          Date: 10/24/2021,   MRN# [id number] Howard [name] 04/30/1931     AdmissionWeight: 88.9 kg                 CurrentWeight: 88.9 kg   Referring physician: Dr Reesa Chew   CHIEF COMPLAINT:   Non-massive hemoptysis   HISTORY OF PRESENT ILLNESS   This is a pleasant 87 yo m with hx of CHF, DM, GERD, HTN, NCLC s/p SBRT, chronic hypoxemia on 3L O2, cAd who came in to ED with acute onset SOB. This happenend at home while at rest suddenly.  He has OSA and was placed on CPAP. He had CTPE done in hospital negative for PE.  His bloodwork showed severely elevated BNP. CT also showed new 73mm RUL nodule. He is being treated for pneumonia empirically and has PCCM consult for abnormal ct chest findings and non - massive hemoptysis.    PAST MEDICAL HISTORY   Past Medical History:  Diagnosis Date   Arthritis    Bell palsy    Bell's palsy 04/12/2015   Cancer [facility])    prostate and skin   Chronic combined systolic and diastolic CHF, NYHA class 1 ([place])    a. 07/2014 Echo: EF 35-40%, Gr 1 DD.   Complete heart block ([place])    a. 11/2010 s/p SJM 2210 [facility] DC PPM, ser# [id number].   Depression    Diabetes mellitus without complication ([place])    Fall 11-10-14   GERD (gastroesophageal reflux disease)    History of prostate cancer    Hyperlipidemia    Hypertension    LBBB (left bundle branch block)    Left-sided Bell's palsy    Lung cancer ([place]) 2016   NICM (nonischemic cardiomyopathy) ([place])    a. 07/2014 Echo: EF 35-40%, mid-apicalanteroseptal DK, Gr 1 DD, mild-mod dil LA.   Non-obstructive CAD    a. 07/2014 Abnl MV;  b. 08/2014 Cath: LM nl, LAD 30p, RI 40p, LCX nl, OM1 40, RCA dominant 30p, 70d-->Med Rx.   Poor balance    Presence of permanent cardiac pacemaker    Sleep apnea    a. cpap   Vertigo    WPW (Wolff-Parkinson-White syndrome)    a. S/P RFCA 1991.     SURGICAL HISTORY   Past Surgical History:  Procedure Laterality Date   ABDOMINAL AORTIC  ENDOVASCULAR STENT GRAFT  08/25/2019   Procedure: ABDOMINAL AORTIC ENDOVASCULAR STENT GRAFT;  Surgeon: Algernon Huxley, MD;  Location: [facility] ORS;  Service: Vascular;;   ANGIOPLASTY Left 08/25/2019   Procedure: ANGIOPLASTY;  Surgeon: Algernon Huxley, MD;  Location: [facility] OR[facility];  Service: Vascular;  Laterality: Left;  left SFA and stent placement   APPLICATION OF WOUND VAC Left 06/07/2015   Procedure: APPLICATION OF WOUND VAC;  Surgeon: Robert Bellow, MD;  Location: [facility] OR[facility];  Service: General;  Laterality: Left;  left upper back   [place]  08/26/2014   Single vessel obstructive CAD   CARPAL TUNNEL RELEASE  04-04-15   [facility]   CATARACT EXTRACTION  07-31-11 and 09-18-11   Catheter ablation  1991   for WPW   cervical fusion     CHOLECYSTECTOMY  09-07-14   ENDARTERECTOMY FEMORAL Left 08/25/2019   Procedure: ENDARTERECTOMY FEMORAL;  Surgeon: Algernon Huxley, MD;  Location: [facility] OR[facility];  Service: Vascular;  Laterality: Left;  common and produndis    ENDOVASCULAR  REPAIR/STENT GRAFT Right 08/25/2019   Procedure: ENDOVASCULAR REPAIR/STENT GRAFT;  Surgeon: Algernon Huxley, MD;  Location: [facility];  Service: Vascular;  Laterality: Right;  renal artery   HAND SURGERY     right 1993; left 2005   [place] / REPLACE / REMOVE PACEMAKER     INSERTION OF ILIAC STENT Bilateral 08/25/2019   Procedure: INSERTION OF ILIAC STENT;  Surgeon: Algernon Huxley, MD;  Location: [facility] OR[facility];  Service: Vascular;  Laterality: Bilateral;   JOINT REPLACEMENT Left 2013   knee   JOINT REPLACEMENT Right 2004   knee   KNEE SURGERY     left knee 1991 and 1992; right knee [place] N/A 08/26/2014   Procedure: LEFT HEART CATHETERIZATION WITH CORONARY ANGIOGRAM;  Surgeon: [name], MD;  Location[facility];  Service: Cardiovascular;  Laterality: N/A;   LOWER EXTREMITY ANGIOGRAPHY Left 08/23/2019   Procedure: Lower Extremity Angiography;  Surgeon: Algernon Huxley, MD;  Location: [place] CV LAB;  Service: Cardiovascular;  Laterality: Left;   LUMBAR LAMINECTOMY/DECOMPRESSION MICRODISCECTOMY N/A 06/07/2014   Procedure: LUMBAR FOUR TO FIVE LUMBAR LAMINECTOMY/DECOMPRESSION MICRODISCECTOMY 1 LEVEL;  Surgeon: [name], MD;  Location: [facility] NEURO ORS;  Service: Neurosurgery;  Laterality: N/A;   LUNG BIOPSY Right 2016   Dr [name]   MOHS SURGERY     PACEMAKER INSERTION     PPM-- St Jude 11/30/10 by [name]   PPM GENERATOR CHANGEOUT N/A 07/09/2019   Procedure: PPM GENERATOR CHANGEOUT;  Surgeon: Deboraha Sprang, MD;  Location: [place] CV LAB;  Service: Cardiovascular;  Laterality: N/A;   PROSTATE SURGERY     cancer--1998, prostatectomy   REPLACEMENT TOTAL KNEE     2004   RIGHT/LEFT HEART CATH AND CORONARY ANGIOGRAPHY Bilateral 04/13/2021   Procedure: RIGHT/LEFT HEART CATH AND CORONARY ANGIOGRAPHY;  Surgeon: Wellington Hampshire, MD;  Location: [place] CV LAB;  Service: Cardiovascular;  Laterality: Bilateral;   ruptured disc     1962 and 1998   TEE WITHOUT CARDIOVERSION N/A 09/01/2019   Procedure: TRANSESOPHAGEAL ECHOCARDIOGRAM (TEE);  Surgeon: [name], MD;  Location: [facility] OR[facility];  Service: Cardiovascular;  Laterality: N/A;   TEMPORARY PACEMAKER N/A 07/09/2019   Procedure: TEMPORARY PACEMAKER;  Surgeon: Deboraha Sprang, MD;  Location: [place] CV LAB;  Service: Cardiovascular;  Laterality: N/A;   TRIGGER FINGER RELEASE  01-24-15   WOUND DEBRIDEMENT Left 06/07/2015   Procedure: DEBRIDEMENT WOUND;  Surgeon: Robert Bellow, MD;  Location: [facility] ORS;  Service: General;  Laterality: Left;  left upper back     FAMILY HISTORY   Family History  Problem Relation Age of Onset   Heart attack Mother    Hyperlipidemia Mother    CAD Other    Prostate cancer Neg Hx    Cancer Neg Hx      SOCIAL HISTORY   Social History   Tobacco Use   Smoking status: Former    Packs/day: 1.00    Years: 56.00    Pack years: 56.00    Types: Cigarettes     Quit date: 2011    Years since quitting: 11.8   Smokeless tobacco: Never  Vaping Use   Vaping Use: Never used  Substance Use Topics   Alcohol use: No   Drug use: No     MEDICATIONS    Home Medication:  Current Outpatient Rx   Order #: [id number] Class: Historical Med  Order #: [id number] Class: Normal   Order #: [id number] Class: Historical Med   Order #: [id number] Class: Historical Med   Order #: [id number] Class: Historical Med   Order #: [id number] Class: Normal   Order #: [id number] Class: Normal   Order #: [id number] Class: Normal   Order #: [id number] Class: Normal   Order #: [id number] Class: Normal   Order #: [id number] Class: Normal   Order #: [id number] Class: Normal   Order #: [id number] Class: Normal   Order #: [id number] Class: Normal   Order #: [id number] Class: Historical Med   Order #: [id number] Class: Historical Med   Order #: [id number] Class: Normal   Order #: [id number] Class: Normal   Order #: [id number] Class: Normal   Order #: [id number] Class: Normal   Order #: [id number] Class: Normal   Order #: [id number] Class: Normal   Order #: [id number] Class: Normal    Current Medication:  Current Facility-Administered Medications:    0.9 %  sodium chloride infusion, 250 mL, Intravenous, PRN, Lorella Nimrod, MD   acetaminophen (TYLENOL) tablet 650 mg, 650 mg, Oral, Q4H PRN, Lorella Nimrod, MD   cholecalciferol (VITAMIN D3) tablet 1,000 Units, 1,000 Units, Oral, Daily, Lorella Nimrod, MD   donepezil (ARICEPT) tablet 5 mg, 5 mg, Oral, Daily, Lorella Nimrod, MD   ezetimibe (ZETIA) tablet 10 mg, 10 mg, Oral, Daily, Amin, Sumayya, MD   fluticasone furoate-vilanterol (BREO ELLIPTA) 100-25 MCG/ACT 1 puff, 1 puff, Inhalation, Daily **AND** umeclidinium bromide (INCRUSE ELLIPTA) 62.5 MCG/ACT 1 puff, 1 puff, Inhalation, Daily, Lorella Nimrod, MD   [START ON 10/25/2021] furosemide (LASIX) injection 40 mg, 40 mg, Intravenous, Daily, Amin, Sumayya, MD   insulin aspart (novoLOG) injection 0-15 Units, 0-15 Units,  Subcutaneous, TID WC, Amin, Sumayya, MD   insulin aspart (novoLOG) injection 0-5 Units, 0-5 Units, Subcutaneous, QHS, Amin, Sumayya, MD   isosorbide mononitrate (IMDUR) 24 hr tablet 30 mg, 30 mg, Oral, Daily, Lorella Nimrod, MD   loratadine (CLARITIN) tablet 10 mg, 10 mg, Oral, Daily, Lorella Nimrod, MD   metoprolol succinate (TOPROL-XL) 24 hr tablet 25 mg, 25 mg, Oral, QHS, Amin, Sumayya, MD   montelukast (SINGULAIR) tablet 10 mg, 10 mg, Oral, Daily PRN, Lorella Nimrod, MD   multivitamin with minerals tablet 1 tablet, 1 tablet, Oral, Daily, Lorella Nimrod, MD   ondansetron (ZOFRAN) injection 4 mg, 4 mg, Intravenous, Q6H PRN, Lorella Nimrod, MD   pantoprazole (PROTONIX) EC tablet 40 mg, 40 mg, Oral, Daily, Amin, Soundra Pilon, MD   sacubitril-valsartan (ENTRESTO) 49-51 mg per tablet, 1 tablet, Oral, BID, Lorella Nimrod, MD   simvastatin (ZOCOR) tablet 40 mg, 40 mg, Oral, QHS, Amin, Sumayya, MD   sodium chloride flush (NS) 0.9 % injection 3 mL, 3 mL, Intravenous, Q12H, Amin, Soundra Pilon, MD, 3 mL at 10/24/21 1028   sodium chloride flush (NS) 0.9 % injection 3 mL, 3 mL, Intravenous, PRN, Lorella Nimrod, MD   spironolactone (ALDACTONE) tablet 25 mg, 25 mg, Oral, Daily, Lorella Nimrod, MD  Current Outpatient Medications:    acetaminophen (TYLENOL) 500 MG tablet, Take 1,000 mg by mouth every 6 (six) hours as needed for moderate pain., Disp: , Rfl:    albuterol (VENTOLIN HFA) 108 (90 Base) MCG/ACT inhaler, INHALE 2 PUFFS INTO LUNGS EVERY 6 HOURS AS NEEDED FOR WHEEZING OR SHORTNESS OF BREATH, Disp: 8.5 g, Rfl: 6   aspirin EC 81 MG tablet, Take 81 mg by mouth daily., Disp: , Rfl:    cetirizine (ZYRTEC) 10 MG tablet, Take 10 mg by mouth daily., Disp: , Rfl:    Cholecalciferol (VITAMIN D3) 1000 UNITS CAPS, Take 1,000 Units by mouth daily., Disp: ,  Rfl:    ELIQUIS 5 MG TABS tablet, TAKE 1 TABLET TWICE A DAY  (SWITCHED FROM PLAVIX) (Patient taking differently: Take 5 mg by mouth 2 (two) times daily.), Disp: 60 tablet, Rfl:  11   ezetimibe (ZETIA) 10 MG tablet, TAKE 1 TABLET DAILY, Disp: 90 tablet, Rfl: 0   Fluticasone-Umeclidin-Vilant (TRELEGY ELLIPTA) 100-62.5-25 MCG/INH AEPB, Inhale 1 puff into the lungs daily., Disp: 180 each, Rfl: 3   furosemide (LASIX) 20 MG tablet, TAKE 1 TABLET BY MOUTH ONCE DAILY, Disp: 30 tablet, Rfl: 5   Iron-Vitamin C (VITRON-C) 65-125 MG TABS, Take 1 tablet by mouth daily., Disp: 30 tablet, Rfl: 2   isosorbide mononitrate (IMDUR) 30 MG 24 hr tablet, TAKE 1 TABLET DAILY, Disp: 90 tablet, Rfl: 2   megestrol (MEGACE) 40 MG tablet, Take 1 tablet (40 mg total) by mouth 2 (two) times daily., Disp: 60 tablet, Rfl: 2   metFORMIN (GLUCOPHAGE) 500 MG tablet, Take 1 tablet (500 mg total) by mouth 2 (two) times daily with a meal., Disp: 60 tablet, Rfl: 5   metoprolol succinate (TOPROL-XL) 25 MG 24 hr tablet, TAKE 1 TABLET AT BEDTIME, Disp: 90 tablet, Rfl: 0   montelukast (SINGULAIR) 10 MG tablet, Take 10 mg by mouth daily as needed (sneezing/allergies.)., Disp: , Rfl:    Multiple Vitamin (MULTIVITAMIN WITH MINERALS) TABS tablet, Take 1 tablet by mouth daily. One-A-Day Multivitamin, Disp: , Rfl:    omeprazole (PRILOSEC) 40 MG capsule, TAKE 1 CAPSULE TWICE DAILY AS NEEDED, Disp: 180 capsule, Rfl: 3   sacubitril-valsartan (ENTRESTO) 49-51 MG, Take 1 tablet by mouth 2 (two) times daily., Disp: 180 tablet, Rfl: 3   simvastatin (ZOCOR) 40 MG tablet, TAKE 1 TABLET AT BEDTIME, Disp: 90 tablet, Rfl: 3   spironolactone (ALDACTONE) 25 MG tablet, Take 1 tablet (25 mg total) by mouth daily., Disp: 90 tablet, Rfl: 3   donepezil (ARICEPT) 5 MG tablet, Take 1 tablet (5 mg total) by mouth daily., Disp: 30 tablet, Rfl: 12   promethazine-dextromethorphan (PROMETHAZINE-DM) 6.25-15 MG/5ML syrup, Take 5 mLs by mouth 3 (three) times daily as needed for cough. (Patient not taking: No sig reported), Disp: 118 mL, Rfl: 0   sertraline (ZOLOFT) 50 MG tablet, Take 1 tablet (50 mg total) by mouth daily., Disp: 30 tablet, Rfl:  3    ALLERGIES   Sulfa antibiotics     REVIEW OF SYSTEMS    Review of Systems:  Gen:  Denies  fever, sweats, chills weigh loss  HEENT: Denies blurred vision, double vision, ear pain, eye pain, hearing loss, nose bleeds, sore throat Cardiac:  No dizziness, chest pain or heaviness, chest tightness,edema Resp:   Denies cough or sputum porduction, shortness of breath,wheezing, hemoptysis,  Gi: Denies swallowing difficulty, stomach pain, nausea or vomiting, diarrhea, constipation, bowel incontinence Gu:  Denies bladder incontinence, burning urine Ext:   Denies Joint pain, stiffness or swelling Skin: Denies  skin rash, easy bruising or bleeding or hives Endoc:  Denies polyuria, polydipsia , polyphagia or weight change Psych:   Denies depression, insomnia or hallucinations   Other:  All other systems negative   VS: BP 139/82   Pulse 84   Temp (!) 97.5 F (36.4 C) (Axillary)   Resp (!) 30   Wt 88.9 kg   SpO2 99%   BMI 28.94 kg/m      PHYSICAL EXAM    GENERAL:NAD, no fevers, chills, no weakness no fatigue HEAD: Normocephalic, atraumatic.  EYES: Pupils equal, round, reactive to light. Extraocular muscles intact. No  scleral icterus.  MOUTH: Moist mucosal membrane. Dentition intact. No abscess noted.  EAR, NOSE, THROAT: Clear without exudates. No external lesions.  NECK: Supple. No thyromegaly. No nodules. No JVD.  PULMONARY: Diffuse coarse rhonchi right sided +wheezes CARDIOVASCULAR: S1 and S2. Regular rate and rhythm. No murmurs, rubs, or gallops. No edema. Pedal pulses 2+ bilaterally.  GASTROINTESTINAL: Soft, nontender, nondistended. No masses. Positive bowel sounds. No hepatosplenomegaly.  MUSCULOSKELETAL: No swelling, clubbing, or edema. Range of motion full in all extremities.  NEUROLOGIC: Cranial nerves II through XII are intact. No gross focal neurological deficits. Sensation intact. Reflexes intact.  SKIN: No ulceration, lesions, rashes, or cyanosis. Skin warm and  dry. Turgor intact.  PSYCHIATRIC: Mood, affect within normal limits. The patient is awake, alert and oriented x 3. Insight, judgment intact.       IMAGING    CT Angio Chest PE W and/or Wo Contrast  Result Date: 10/24/2021 CLINICAL DATA:  Acute onset shortness of breath. History of lung cancer. EXAM: CT ANGIOGRAPHY CHEST WITH CONTRAST TECHNIQUE: Multidetector CT imaging of the chest was performed using the standard protocol during bolus administration of intravenous contrast. Multiplanar CT image reconstructions and MIPs were obtained to evaluate the vascular anatomy. CONTRAST:  4mL OMNIPAQUE IOHEXOL 350 MG/ML SOLN COMPARISON:  CT chest dated September 12, 2021. FINDINGS: Cardiovascular: Satisfactory opacification of the pulmonary arteries to the segmental level. No evidence of pulmonary embolism. Unchanged mild cardiomegaly. No pericardial effusion. No thoracic aortic aneurysm. Coronary, aortic arch, and branch vessel atherosclerotic vascular disease. Mediastinum/Nodes: No enlarged mediastinal, hilar, or axillary lymph nodes. Unchanged index subcarinal lymph node measuring 1.1 cm in short axis. Thyroid gland, trachea, and esophagus demonstrate no significant findings. Lungs/Pleura: New patchy ground-glass densities with areas of nodular consolidation in the right upper, right middle, and right lower lobes, with inter- and intralobular septal thickening. Post treatment related dense fibrotic consolidation bronchiectasis in the medial right upper and lower lobes has progressed. New small right and trace left pleural effusions. No pneumothorax. Unchanged calcified scarring in the lingula and medial right middle lobe. Mild dependent subsegmental atelectasis in the posterior left lower lobe. Mild emphysema. New 9 x 8 mm spiculated density in the right upper lobe (series 6, image 22). Upper Abdomen: No acute abnormality. Musculoskeletal: No chest wall abnormality. No acute or significant osseous findings.  Review of the MIP images confirms the above findings. IMPRESSION: 1. No evidence of pulmonary embolism. 2. New patchy ground-glass densities with areas of nodular consolidation in the right upper, right middle, and right lower lobes, with inter- and intralobular septal thickening. Differential considerations include multifocal pneumonia, asymmetric pulmonary edema, and pulmonary hemorrhage. 3. New small right and trace left pleural effusions. 4. New 9 x 8 mm spiculated density in the right upper lobe, presumably related to underlying acute process in the right lung. However, attention on follow-up imaging is recommended. 5. Aortic Atherosclerosis (ICD10-I70.0) and Emphysema (ICD10-J43.9). Electronically Signed   By: Titus Dubin M.D.   On: 10/24/2021 11:14   DG Chest Portable 1 View  Result Date: 10/24/2021 CLINICAL DATA:  Shortness of breath. EXAM: PORTABLE CHEST 1 VIEW COMPARISON:  CT chest dated September 12, 2021. Chest x-ray dated July 03, 2021. FINDINGS: Left chest wall pacemaker again noted. Unchanged mild cardiomegaly. New hazy airspace disease in the right mid and lower lung. Unchanged scarring in the left lung base and post treatment changes in the medial right lower lobe. No pleural effusion or pneumothorax. No acute osseous abnormality. IMPRESSION: 1. New hazy airspace disease  in the right mid and lower lung concerning for pneumonia. Electronically Signed   By: Titus Dubin M.D.   On: 10/24/2021 08:54   CUP PACEART REMOTE DEVICE CHECK  Result Date: 10/09/2021 Scheduled remote reviewed. Normal device function.  14 AMS, <10sec PAT 1 PMT appropriately treated without termination Next remote 91 days. LR     ASSESSMENT/PLAN   Acute hypoxemic respiratory failure - present on admission  - COVID19 negative -there is acute on chornic CHF but infiltrate is almost exclusively on right lung which suggests pneumonia as primary inciting event.   -Procalcitonin is normal and there is no  leukocytosis or fevers to suggest active infection - supplemental O2 during my evaluation BIPAP 100% - infectious workup for pneumonia as below: -Respiratory viral panel -serum fungitell -legionella ab -strep pneumoniae ur AG -Histoplasma Ur Ag -sputum resp cultures -AFB sputum expectorated specimen -sputum cytology  -reviewed pertinent imaging with patient today - CRP -PT/OT for d/c planning  -please encourage patient to use incentive spirometer few times each hour while hospitalized.   -ABG -appreciate cardiology consultation input      Thank you for allowing me to participate in the care of this patient.  Total face to face encounter time for this patient visit was >87min. >50% of the time was  spent in counseling and coordination of care.   Patient/Family are satisfied with care plan and all questions have been answered.  This document was prepared using Dragon voice recognition software and may include unintentional dictation errors.     Ottie Glazier, M.D.  Division of [place]

## 2021-10-24 NOTE — ED Provider Notes (Addendum)
[facility] Emergency Department Provider Note  Time seen: 8:28 AM  I have reviewed the triage vital signs and the nursing notes.   HISTORY  Chief Complaint Respiratory Distress   HPI [name] is a 87 y.o. male with a past medical history of CHF, diabetes, gastric reflux, hypertension, hyperlipidemia, lung cancer, CAD, presents to the emergency department for shortness of breath.  According to the patient and EMS patient woke up this morning with acute onset of significant shortness of breath.  Patient does wear oxygen at home, per report slept well through the night and then became acutely short of breath this morning.  Upon EMS arrival patient noted to be in mild respiratory distress with diffuse rhonchi hypertension 160/90 patient was placed on CPAP and brought to the emergency department.  Upon arrival to the emergency department patient transitioned to BiPAP, continues to have mild bilateral rhonchi on my examination is able to speak in 2 word sentences through the mask.  Denies any chest pain or abdominal pain.  No fever.  Patient does have lung cancer and has been receiving radiation therapy.   Past Medical History:  Diagnosis Date   Arthritis    Bell palsy    Bell's palsy 04/12/2015   Cancer [facility])    prostate and skin   Chronic combined systolic and diastolic CHF, NYHA class 1 ([place])    a. 07/2014 Echo: EF 35-40%, Gr 1 DD.   Complete heart block ([place])    a. 11/2010 s/p SJM 2210 [employer] DC PPM, ser# [id number].   Depression    Diabetes mellitus without complication ([place])    Fall 11-10-14   GERD (gastroesophageal reflux disease)    History of prostate cancer    Hyperlipidemia    Hypertension    LBBB (left bundle branch block)    Left-sided Bell's palsy    Lung cancer ([place]) 2016   NICM (nonischemic cardiomyopathy) ([place])    a. 07/2014 Echo: EF 35-40%, mid-apicalanteroseptal DK, Gr 1 DD, mild-mod dil LA.   Non-obstructive CAD    a. 07/2014 Abnl MV;  b.  08/2014 Cath: LM nl, LAD 30p, RI 40p, LCX nl, OM1 40, RCA dominant 30p, 70d-->Med Rx.   Poor balance    Presence of permanent cardiac pacemaker    Sleep apnea    a. cpap   Vertigo    WPW (Wolff-Parkinson-White syndrome)    a. S/P RFCA 1991.    Patient Active Problem List   Diagnosis Date Noted   Normocytic anemia 09/18/2021   Other fatigue 53/64/6803   Chronic systolic congestive heart failure ([place]) 07/18/2021   Goals of care, counseling/discussion 04/16/2021   Mediastinal lymphadenopathy 04/16/2021   Heart failure with reduced ejection fraction ([place])    AAA (abdominal aortic aneurysm) without rupture 12/10/2019   Renal artery stenosis ([place]) 12/10/2019   PAD (peripheral artery disease) ([place]) 12/10/2019   Thromboembolism ([place]) 08/30/2019   Atherosclerosis of native arteries of extremity with intermittent claudication ([place]) 08/23/2019   Swelling of limb 08/17/2019   Pain in limb 08/17/2019   NICM (nonischemic cardiomyopathy) ([place]) 07/01/2019   CHF (congestive heart failure) ([place]) 07/01/2019   Malignant neoplasm of right lung ([place]) 04/18/2016   CAD (coronary artery disease) 04/01/2016   Adenocarcinoma ([place]) 04/01/2016   Skin cyst 12/26/2015   GERD (gastroesophageal reflux disease) 06/16/2015   Abscess of back 06/06/2015   Vertigo 03/27/2015   Carpal tunnel syndrome 10/28/2014   Status post cholecystectomy 09/29/2014   Disease of digestive tract 09/29/2014  Cardiomyopathy ([place]) 09/26/2014   Type 2 diabetes mellitus without complications ([place])    Sleep apnea    Spinal stenosis, lumbar region, with neurogenic claudication 06/07/2014   Lumbar stenosis with neurogenic claudication 06/07/2014   Abnormal gait 08/20/2012   H/O total knee replacement 08/20/2012   Arthritis of knee, degenerative 08/20/2012   Pacemaker-St.Jude 08/03/2012   Cardiac conduction disorder 06/19/2012   Acid reflux 06/18/2012   Nodal rhythm disorder 06/18/2012   Triggering of digit 03/25/2012    Hyperlipidemia 12/23/2011   Essential hypertension 03/23/2011   Atrioventricular block, complete ([place]) 03/23/2011   Complete atrioventricular block ([place]) 03/23/2011    Past Surgical History:  Procedure Laterality Date   ABDOMINAL AORTIC ENDOVASCULAR STENT GRAFT  08/25/2019   Procedure: ABDOMINAL AORTIC ENDOVASCULAR STENT GRAFT;  Surgeon: Algernon Huxley, MD;  Location: [facility] ORS;  Service: Vascular;;   ANGIOPLASTY Left 08/25/2019   Procedure: ANGIOPLASTY;  Surgeon: Algernon Huxley, MD;  Location: [facility] ORS;  Service: Vascular;  Laterality: Left;  left SFA and stent placement   APPLICATION OF WOUND VAC Left 06/07/2015   Procedure: APPLICATION OF WOUND VAC;  Surgeon: Robert Bellow, MD;  Location: [facility] ORS;  Service: General;  Laterality: Left;  left upper back   [place]  08/26/2014   Single vessel obstructive CAD   CARPAL TUNNEL RELEASE  04-04-15   [facility]   CATARACT EXTRACTION  07-31-11 and 09-18-11   Catheter ablation  1991   for WPW   cervical fusion     CHOLECYSTECTOMY  09-07-14   ENDARTERECTOMY FEMORAL Left 08/25/2019   Procedure: ENDARTERECTOMY FEMORAL;  Surgeon: Algernon Huxley, MD;  Location: [facility] ORS;  Service: Vascular;  Laterality: Left;  common and produndis    ENDOVASCULAR REPAIR/STENT GRAFT Right 08/25/2019   Procedure: ENDOVASCULAR REPAIR/STENT GRAFT;  Surgeon: Algernon Huxley, MD;  Location: [facility] ORS;  Service: Vascular;  Laterality: Right;  renal artery   HAND SURGERY     right 1993; left 2005   [place] / REPLACE / REMOVE PACEMAKER     INSERTION OF ILIAC STENT Bilateral 08/25/2019   Procedure: INSERTION OF ILIAC STENT;  Surgeon: Algernon Huxley, MD;  Location: [facility] ORS;  Service: Vascular;  Laterality: Bilateral;   JOINT REPLACEMENT Left 2013   knee   JOINT REPLACEMENT Right 2004   knee   KNEE SURGERY     left knee 1991 and 1992; right knee [place] N/A 08/26/2014   Procedure: LEFT HEART  CATHETERIZATION WITH CORONARY ANGIOGRAM;  Surgeon: [name], MD;  Location: [facility] CATH LAB;  Service: Cardiovascular;  Laterality: N/A;   LOWER EXTREMITY ANGIOGRAPHY Left 08/23/2019   Procedure: Lower Extremity Angiography;  Surgeon: Algernon Huxley, MD;  Location: [place] CV LAB;  Service: Cardiovascular;  Laterality: Left;   LUMBAR LAMINECTOMY/DECOMPRESSION MICRODISCECTOMY N/A 06/07/2014   Procedure: LUMBAR FOUR TO FIVE LUMBAR LAMINECTOMY/DECOMPRESSION MICRODISCECTOMY 1 LEVEL;  Surgeon: [name], MD;  Location: [facility] NEURO ORS;  Service: Neurosurgery;  Laterality: N/A;   LUNG BIOPSY Right 2016   Dr [name]   MOHS SURGERY     PACEMAKER INSERTION     PPM-- St Jude 11/30/10 by [name]   PPM GENERATOR CHANGEOUT N/A 07/09/2019   Procedure: PPM GENERATOR CHANGEOUT;  Surgeon: Deboraha Sprang, MD;  Location: [place] CV LAB;  Service: Cardiovascular;  Laterality: N/A;   PROSTATE SURGERY  cancer--1998, prostatectomy   REPLACEMENT TOTAL KNEE     2004   RIGHT/LEFT HEART CATH AND CORONARY ANGIOGRAPHY Bilateral 04/13/2021   Procedure: RIGHT/LEFT HEART CATH AND CORONARY ANGIOGRAPHY;  Surgeon: Wellington Hampshire, MD;  Location: [place] CV LAB;  Service: Cardiovascular;  Laterality: Bilateral;   ruptured disc     1962 and 1998   TEE WITHOUT CARDIOVERSION N/A 09/01/2019   Procedure: TRANSESOPHAGEAL ECHOCARDIOGRAM (TEE);  Surgeon: [name], MD;  Location: [facility] ORS;  Service: Cardiovascular;  Laterality: N/A;   TEMPORARY PACEMAKER N/A 07/09/2019   Procedure: TEMPORARY PACEMAKER;  Surgeon: Deboraha Sprang, MD;  Location: [place] CV LAB;  Service: Cardiovascular;  Laterality: N/A;   TRIGGER FINGER RELEASE  01-24-15   WOUND DEBRIDEMENT Left 06/07/2015   Procedure: DEBRIDEMENT WOUND;  Surgeon: Robert Bellow, MD;  Location: [facility] ORS;  Service: General;  Laterality: Left;  left upper back    Prior to Admission medications   Medication Sig Start Date End Date Taking? Authorizing Provider   acetaminophen (TYLENOL) 500 MG tablet Take 1,000 mg by mouth every 6 (six) hours as needed for moderate pain.    [provider]  albuterol (VENTOLIN HFA) 108 (90 Base) MCG/ACT inhaler INHALE 2 PUFFS INTO LUNGS EVERY 6 HOURS AS NEEDED FOR WHEEZING OR SHORTNESS OF BREATH 07/23/21   Tyler Pita, MD  aspirin EC 81 MG tablet Take 81 mg by mouth daily.    [provider]  cetirizine (ZYRTEC) 10 MG tablet Take 10 mg by mouth daily.    [provider]  Cholecalciferol (VITAMIN D3) 1000 UNITS CAPS Take 1,000 Units by mouth daily.    [provider]  donepezil (ARICEPT) 5 MG tablet Take 1 tablet (5 mg total) by mouth daily. 10/23/21   Jerrol Banana., MD  ELIQUIS 5 MG TABS tablet TAKE 1 TABLET TWICE A DAY  (SWITCHED FROM PLAVIX) 08/28/21   Algernon Huxley, MD  ezetimibe (ZETIA) 10 MG tablet TAKE 1 TABLET DAILY 09/24/21   [name], MD  Fluticasone-Umeclidin-Vilant (TRELEGY ELLIPTA) 100-62.5-25 MCG/INH AEPB Inhale 1 puff into the lungs daily. 02/14/21   Tyler Pita, MD  furosemide (LASIX) 20 MG tablet TAKE 1 TABLET BY MOUTH ONCE DAILY 10/09/21   [name], MD  Iron-Vitamin C (VITRON-C) 65-125 MG TABS Take 1 tablet by mouth daily. 04/16/21   Earlie Server, MD  isosorbide mononitrate (IMDUR) 30 MG 24 hr tablet TAKE 1 TABLET DAILY 02/12/21   [name], MD  megestrol (MEGACE) 40 MG tablet Take 1 tablet (40 mg total) by mouth 2 (two) times daily. 09/18/21   Earlie Server, MD  metFORMIN (GLUCOPHAGE) 500 MG tablet Take 1 tablet (500 mg total) by mouth 2 (two) times daily with a meal. 10/23/21   Jerrol Banana., MD  metoprolol succinate (TOPROL-XL) 25 MG 24 hr tablet TAKE 1 TABLET AT BEDTIME 09/24/21   Gollan, Kathlene November, MD  montelukast (SINGULAIR) 10 MG tablet Take 10 mg by mouth daily as needed (sneezing/allergies.).    [provider]  Multiple Vitamin (MULTIVITAMIN WITH MINERALS) TABS tablet Take 1 tablet by mouth daily. One-A-Day  Multivitamin    [provider]  omeprazole (PRILOSEC) 40 MG capsule TAKE 1 CAPSULE TWICE DAILY AS NEEDED 02/12/21   Chrismon, [name], PA-C  promethazine-dextromethorphan (PROMETHAZINE-DM) 6.25-15 MG/5ML syrup Take 5 mLs by mouth 3 (three) times daily as needed for cough. Patient not taking: No sig reported 07/01/21   Danton Clap,  PA-C  sacubitril-valsartan (ENTRESTO) 49-51 MG Take 1 tablet by mouth 2 (two) times daily. 07/25/21 11/22/21  [name], MD  sertraline (ZOLOFT) 50 MG tablet Take 1 tablet (50 mg total) by mouth daily. 10/23/21   Jerrol Banana., MD  simvastatin (ZOCOR) 40 MG tablet TAKE 1 TABLET AT BEDTIME 03/23/21   [name], MD  spironolactone (ALDACTONE) 25 MG tablet Take 1 tablet (25 mg total) by mouth daily. 10/02/21 09/27/22  [name], MD    Allergies  Allergen Reactions   Sulfa Antibiotics Rash    Family History  Problem Relation Age of Onset   Heart attack Mother    Hyperlipidemia Mother    CAD Other    Prostate cancer Neg Hx    Cancer Neg Hx     Social History Social History   Tobacco Use   Smoking status: Former    Packs/day: 1.00    Years: 56.00    Pack years: 56.00    Types: Cigarettes    Quit date: 2011    Years since quitting: 11.8   Smokeless tobacco: Never  Vaping Use   Vaping Use: Never used  Substance Use Topics   Alcohol use: No   Drug use: No    Review of Systems Constitutional: Negative for fever. Cardiovascular: Negative for chest pain. Respiratory: Positive for shortness of breath.  Positive for cough. Gastrointestinal: Negative for abdominal pain, vomiting  Musculoskeletal: Negative for musculoskeletal complaints Neurological: Negative for headache All other ROS negative  ____________________________________________   PHYSICAL EXAM:  VITAL SIGNS: ED Triage Vitals  Enc Vitals Group     BP      Pulse      Resp      Temp      Temp src      SpO2      Weight      Height      Head  Circumference      Peak Flow      Pain Score      Pain Loc      Pain Edu?      Excl. in [place]?    Constitutional: Patient is awake alert, mild respiratory distress sitting upright in bed, initially on CPAP transition to BiPAP. Eyes: Normal exam ENT      Head: Normocephalic and atraumatic.      Mouth/Throat: Mucous membranes are moist. Cardiovascular: Regular rhythm rate around 120 bpm. Respiratory: Mild respiratory distress sitting upright in bed currently on CPAP then transition to BiPAP.  Mild diffuse rhonchi on examination. Gastrointestinal: Soft and nontender. No distention.   Musculoskeletal: Nontender with normal range of motion in all extremities. No lower extremity tenderness or edema. Neurologic:  Normal speech and language. No gross focal neurologic deficits  Skin:  Skin is warm, dry and intact.  Psychiatric: Mood and affect are normal.   ____________________________________________    EKG  EKG viewed and interpreted by myself shows an unspecified rhythm possible atrial flutter versus left bundle branch block at 106 bpm with a widened QRS, left axis deviation, prolonged QTC otherwise normal intervals nonspecific ST changes. Old EKG shows ventricular paced rhythm.  After further review patient appears to have pacer spikes in aVL likely ventricular paced rhythm and today's EKG as well.  ____________________________________________    RADIOLOGY  CT negative for PE.  Pneumonia versus CHF  ____________________________________________   INITIAL IMPRESSION / ASSESSMENT AND PLAN / ED COURSE  Pertinent labs & imaging results that were available during  my care of the patient were reviewed by me and considered in my medical decision making (see chart for details).   Patient presents emergency department for cute onset of shortness of breath this morning.  Patient arrives on CPAP tachypneic sitting upright in bed mild respiratory distress, mild rhonchi diffusely on my examination  we will switch to BiPAP.  We will check labs, including a BNP and troponin we will obtain an EKG, portable chest x-ray.  Patient's blood pressure per EMS 160/90, currently 129/113 given the patient's shortness of breath with rhonchi and elevated diastolic pressures we will dose 1 inch of nitroglycerin ointment and reassess.  We will obtain COVID/flu swab and continue to closely monitor.  Differential is quite broad at this time but would include CHF/flash pulmonary edema, pneumonia, pneumothorax, PE or ACS.  Patient doing well on BiPAP.  CTA negative for PE.  Work-up appears consistent with pneumonia versus CHF.  We will place patient on antibiotics as well as use IV Lasix.  Patient will be admitted to the hospital service for further work-up and treatment  RAYHAAN HUSTER was evaluated in Emergency Department on 10/24/2021 for the symptoms described in the history of present illness. He was evaluated in the context of the global COVID-19 pandemic, which necessitated consideration that the patient might be at risk for infection with the SARS-CoV-2 virus that causes COVID-19. Institutional protocols and algorithms that pertain to the evaluation of patients at risk for COVID-19 are in a state of rapid change based on information released by regulatory bodies including the CDC and federal and state organizations. These policies and algorithms were followed during the patient's care in the ED.  CRITICAL CARE Performed by: Harvest Dark   Total critical care time: 30 minutes  Critical care time was exclusive of separately billable procedures and treating other patients.  Critical care was necessary to treat or prevent imminent or life-threatening deterioration.  Critical care was time spent personally by me on the following activities: development of treatment plan with patient and/or surrogate as well as nursing, discussions with consultants, evaluation of patient's response to treatment, examination  of patient, obtaining history from patient or surrogate, ordering and performing treatments and interventions, ordering and review of laboratory studies, ordering and review of radiographic studies, pulse oximetry and re-evaluation of patient's condition.  ____________________________________________   FINAL CLINICAL IMPRESSION(S) / ED DIAGNOSES  Dyspnea CHF Pneumonia   Harvest Dark, MD 10/24/21 1542    Harvest Dark, MD 10/24/21 [phone]

## 2021-10-25 ENCOUNTER — Inpatient Hospital Stay (HOSPITAL_COMMUNITY)
Admit: 2021-10-25 | Discharge: 2021-10-25 | Disposition: A | Discharge status: Home / Self Care | Payer: Medicare Other | Attending: Internal Medicine | Admitting: Internal Medicine

## 2021-10-25 ENCOUNTER — Inpatient Hospital Stay: Payer: Medicare Other

## 2021-10-25 DIAGNOSIS — R0602 Shortness of breath: Secondary | ICD-10-CM | POA: Diagnosis not present

## 2021-10-25 DIAGNOSIS — J9601 Acute respiratory failure with hypoxia: Secondary | ICD-10-CM | POA: Diagnosis not present

## 2021-10-25 DIAGNOSIS — I5023 Acute on chronic systolic (congestive) heart failure: Secondary | ICD-10-CM | POA: Diagnosis not present

## 2021-10-25 DIAGNOSIS — R778 Other specified abnormalities of plasma proteins: Secondary | ICD-10-CM | POA: Diagnosis not present

## 2021-10-25 DIAGNOSIS — I509 Heart failure, unspecified: Secondary | ICD-10-CM | POA: Diagnosis not present

## 2021-10-25 DIAGNOSIS — R0902 Hypoxemia: Secondary | ICD-10-CM | POA: Diagnosis not present

## 2021-10-25 DIAGNOSIS — I428 Other cardiomyopathies: Secondary | ICD-10-CM

## 2021-10-25 DIAGNOSIS — J189 Pneumonia, unspecified organism: Secondary | ICD-10-CM | POA: Diagnosis not present

## 2021-10-25 DIAGNOSIS — I25118 Atherosclerotic heart disease of native coronary artery with other forms of angina pectoris: Secondary | ICD-10-CM

## 2021-10-25 LAB — ECHOCARDIOGRAM COMPLETE
AR max vel: 2.14 cm2
AV Area VTI: 2.19 cm2
AV Area mean vel: 2.22 cm2
AV Mean grad: 2 mmHg
AV Peak grad: 4.8 mmHg
Ao pk vel: 1.1 m/s
Area-P 1/2: 5.58 cm2
MV VTI: 1.06 cm2
S' Lateral: 3.46 cm
Weight: 2962.98 oz

## 2021-10-25 LAB — GLUCOSE, CAPILLARY
Glucose-Capillary: 188 mg/dL — ABNORMAL HIGH (ref 70–99)
Glucose-Capillary: 207 mg/dL — ABNORMAL HIGH (ref 70–99)
Glucose-Capillary: 165 mg/dL — ABNORMAL HIGH (ref 70–99)
Glucose-Capillary: 177 mg/dL — ABNORMAL HIGH (ref 70–99)

## 2021-10-25 LAB — PROTIME-INR
INR: 1.4 — ABNORMAL HIGH (ref 0.8–1.2)
Prothrombin Time: 17.4 seconds — ABNORMAL HIGH (ref 11.4–15.2)

## 2021-10-25 LAB — BASIC METABOLIC PANEL
Anion gap: 8 (ref 5–15)
BUN: 25 mg/dL — ABNORMAL HIGH (ref 8–23)
CO2: 20 mmol/L — ABNORMAL LOW (ref 22–32)
Calcium: 9 mg/dL (ref 8.9–10.3)
Chloride: 111 mmol/L (ref 98–111)
Creatinine, Ser: 1.11 mg/dL (ref 0.61–1.24)
GFR, Estimated: >60 mL/min (ref >60)
Glucose, Bld: 160 mg/dL — ABNORMAL HIGH (ref 70–99)
Potassium: 3.9 mmol/L (ref 3.5–5.1)
Sodium: 139 mmol/L (ref 135–145)

## 2021-10-25 LAB — CBC
HCT: 33.2 % — ABNORMAL LOW (ref 39.0–52.0)
Hemoglobin: 11.1 g/dL — ABNORMAL LOW (ref 13.0–17.0)
MCH: 28 pg (ref 26.0–34.0)
MCHC: 33.4 g/dL (ref 30.0–36.0)
MCV: 83.6 fL (ref 80.0–100.0)
Platelets: 208 10*3/uL (ref 150–400)
RBC: 3.97 MIL/uL — ABNORMAL LOW (ref 4.22–5.81)
RDW: 18.4 % — ABNORMAL HIGH (ref 11.5–15.5)
WBC: 7.2 10*3/uL (ref 4.0–10.5)
nRBC: 0 % (ref 0.0–0.2)

## 2021-10-25 LAB — TROPONIN I (HIGH SENSITIVITY)
Troponin I (High Sensitivity): 10829 ng/L (ref <18)
Troponin I (High Sensitivity): 10263 ng/L (ref <18)

## 2021-10-25 LAB — APTT
aPTT: 42 seconds — ABNORMAL HIGH (ref 24–36)
aPTT: 142 seconds — ABNORMAL HIGH (ref 24–36)

## 2021-10-25 LAB — HEPARIN LEVEL (UNFRACTIONATED): Heparin Unfractionated: >1.1 IU/mL — ABNORMAL HIGH (ref 0.30–0.70)

## 2021-10-25 LAB — PROCALCITONIN: Procalcitonin: <0.1 ng/mL

## 2021-10-25 MED ORDER — HEPARIN (PORCINE) 25000 UT/250ML-% IV SOLN
1150.0000 [IU]/h | INTRAVENOUS | Status: DC
  Administered 2021-10-25: 1150 [IU]/h via INTRAVENOUS
  Filled 2021-10-25: qty 250

## 2021-10-25 MED ORDER — HEPARIN (PORCINE) 25000 UT/250ML-% IV SOLN
950.0000 [IU]/h | INTRAVENOUS | Status: DC
  Administered 2021-10-26: 900 [IU]/h via INTRAVENOUS
  Filled 2021-10-25: qty 250

## 2021-10-25 MED ORDER — DAPAGLIFLOZIN PROPANEDIOL 5 MG PO TABS
10.0000 mg | ORAL_TABLET | Freq: Every day | ORAL | Status: DC
  Filled 2021-10-25: qty 2

## 2021-10-25 MED ORDER — DAPAGLIFLOZIN PROPANEDIOL 5 MG PO TABS
10.0000 mg | ORAL_TABLET | Freq: Every day | ORAL | Status: DC
  Filled 2021-10-25 (×3): qty 2

## 2021-10-25 MED ORDER — HEPARIN BOLUS VIA INFUSION
4000.0000 [IU] | Freq: Once | INTRAVENOUS | Status: AC
  Administered 2021-10-25: 4000 [IU] via INTRAVENOUS
  Filled 2021-10-25: qty 4000

## 2021-10-25 NOTE — Consult Note (Signed)
   Heart Failure Nurse Navigator Note  HFrEF 20 to 25% by echocardiogram performed in August 2022.  Results of echocardiogram are pending on this admission.  He presented to the emergency room with complaints of sudden onset of shortness of breath.  Comorbidities:  Diabetes Gastric reflux Hypertension Hyperlipidemia Emphysema Coronary artery disease  Status post radiation therapy for non-small cell lung cancer. Pacemaker insertion for complete heart block.  Labs:  BNP on admission was 4227.  Sodium 139, potassium 3.9, chloride 111, CO2 20, BUN 25, creatinine 1.11 troponin peaked at 12,859 today is 10,829. Weight is 84 kg Blood pressure 119/72 Intake-603 mL Output 650 mL   Medications:  Aspirin 81 mg daily Zetia 10 mg daily Furosemide 40 mg IV daily  Heparin infusion Isosorbide mononitrate 30 mg daily Metoprolol succinate 25 mg daily Entresto 49/51 mg 2 times a day Zocor 40 mg daily Spironolactone 25 mg daily  Initial meeting with patient and wife who was at the bedside.  She states since his treatment for cancer he lost weight and they have been working on him gaining weight.  Explained daily weights and what to report to physician.  Also discussed the importance of limiting fluid intake to less than 64 ounces in a days time.  This does include ice cream and Jell-O that melts at room temperature.  Discussed low-sodium diet, he does use salt at the table and they eat at restaurants daily.  Discussed making healthy choices.  She is concerned because he has become such at picky eater, discussed having the heart failure dietitian speak with them.  They are in agreement.   Given the living with heart failure teaching booklet along with information on low-sodium, and information on the heart failure clinic along with an appointment for 11/11 at [facility]

## 2021-10-25 NOTE — Progress Notes (Signed)
*  PRELIMINARY RESULTS* Echocardiogram 2D Echocardiogram has been performed.  Howard [name] 10/25/2021, 9:01 AM

## 2021-10-25 NOTE — Progress Notes (Addendum)
Progress Note  Patient Name: Howard [name] Date of Encounter: 10/25/2021  Primary Cardiologist: [name]  Subjective   No chest pain. Dyspnea improved when compared to yesterday. Now off BiPAP and on supplemental oxygen via nasal cannula at 5 L. High sensitivity troponin trended to 12[address] yesterday afternoon. Echo pending.   Inpatient Medications    Scheduled Meds:  apixaban  5 mg Oral BID   aspirin EC  81 mg Oral Daily   cholecalciferol  1,000 Units Oral Daily   donepezil  5 mg Oral Daily   ezetimibe  10 mg Oral Daily   fluticasone furoate-vilanterol  1 puff Inhalation Daily   And   umeclidinium bromide  1 puff Inhalation Daily   furosemide  40 mg Intravenous Daily   insulin aspart  0-15 Units Subcutaneous TID WC   insulin aspart  0-5 Units Subcutaneous QHS   isosorbide mononitrate  30 mg Oral Daily   loratadine  10 mg Oral Daily   metoprolol succinate  25 mg Oral QHS   multivitamin with minerals  1 tablet Oral Daily   pantoprazole  40 mg Oral Daily   sacubitril-valsartan  1 tablet Oral BID   simvastatin  40 mg Oral QHS   sodium chloride flush  3 mL Intravenous Q12H   spironolactone  25 mg Oral Daily   Continuous Infusions:  sodium chloride     ceFEPime (MAXIPIME) IV 2 g (10/24/21 2256)   PRN Meds: sodium chloride, acetaminophen, montelukast, ondansetron (ZOFRAN) IV, sodium chloride flush   Vital Signs    Vitals:   10/25/21 0000 10/25/21 0220 10/25/21 0350 10/25/21 0735  BP: 118/63 (!) 111/59 104/69 123/69  Pulse: (!) 110 91 88 (!) 103  Resp: 20 18 20 18   Temp:  98.4 F (36.9 C) 98.3 F (36.8 C) 98.4 F (36.9 C)  TempSrc:      SpO2: 93% 95% 96% 95%  Weight:   84 kg     Intake/Output Summary (Last 24 hours) at 10/25/2021 0835 Last data filed at 10/25/2021 0600 Gross per 24 hour  Intake 603.1 ml  Output 650 ml  Net -46.9 ml   Filed Weights   10/24/21 0833 10/25/21 0350  Weight: 88.9 kg 84 kg    Telemetry    V paced - Personally Reviewed  ECG     No new tracings - Personally Reviewed  Physical Exam   GEN: No acute distress.   Neck: No JVD. Cardiac: RRR, I/VI systolic murmur LSB, no rubs, or gallops.  Respiratory: Diminished and coarse breath sounds along the right mid and lower right and left lung fields.On supplemental oxygen via nasal cannula at 5 L. GI: Soft, nontender, non-distended.   MS: No edema; No deformity. Neuro:  Alert and oriented x 3; Nonfocal.  Psych: Normal affect.  Labs    Chemistry Recent Labs  Lab 10/24/21 0827 10/25/21 0446  NA 139 139  K 4.1 3.9  CL 110 111  CO2 18* 20*  GLUCOSE 241* 160*  BUN 25* 25*  CREATININE 1.27* 1.11  CALCIUM 9.1 9.0  PROT 7.2  --   ALBUMIN 3.8  --   AST 21  --   ALT 15  --   ALKPHOS 49  --   BILITOT 0.8  --   GFRNONAA 54* >60  ANIONGAP 11 8     Hematology Recent Labs  Lab 10/24/21 0827  WBC 9.7  RBC 4.60  HGB 12.6*  HCT 39.3  MCV 85.4  MCH 27.4  MCHC 32.1  RDW 18.7*  PLT 282    Cardiac EnzymesNo results for input(s): TROPONINI in the last 168 hours. No results for input(s): TROPIPOC in the last 168 hours.   BNP Recent Labs  Lab 10/24/21 0827  BNP 4,227.2*     DDimer No results for input(s): DDIMER in the last 168 hours.   Radiology    CT Angio Chest PE W and/or Wo Contrast  Result Date: 10/24/2021 IMPRESSION: 1. No evidence of pulmonary embolism. 2. New patchy ground-glass densities with areas of nodular consolidation in the right upper, right middle, and right lower lobes, with inter- and intralobular septal thickening. Differential considerations include multifocal pneumonia, asymmetric pulmonary edema, and pulmonary hemorrhage. 3. New small right and trace left pleural effusions. 4. New 9 x 8 mm spiculated density in the right upper lobe, presumably related to underlying acute process in the right lung. However, attention on follow-up imaging is recommended. 5. Aortic Atherosclerosis (ICD10-I70.0) and Emphysema (ICD10-J43.9).  Electronically Signed   By: Titus Dubin M.D.   On: 10/24/2021 11:14   DG Chest Portable 1 View  Result Date: 10/24/2021 IMPRESSION: 1. New hazy airspace disease in the right mid and lower lung concerning for pneumonia. Electronically Signed   By: Titus Dubin M.D.   On: 10/24/2021 08:54    Cardiac Studies   2D echo 10/25/2021: Pending __________  2D echo 07/24/2021:  1. Left ventricular ejection fraction, by estimation, is 20 to 25%. The  left ventricle has severely decreased function. The left ventricle  demonstrates global hypokinesis. There is moderate left ventricular  hypertrophy. Left ventricular diastolic  parameters are indeterminate.   2. Right ventricular systolic function is normal. The right ventricular  size is normal. Tricuspid regurgitation signal is inadequate for assessing  PA pressure.   3. Left atrial size was mildly dilated.   4. The mitral valve is degenerative. Mild mitral valve regurgitation.   5. The aortic valve is tricuspid. There is moderate calcification of the  aortic valve. There is moderate thickening of the aortic valve. Aortic  valve regurgitation is not visualized. Mild to moderate aortic valve  sclerosis/calcification is present,  without any evidence of aortic stenosis. __________  [facility] 04/13/2021: Prox RCA lesion is 30% stenosed. Dist RCA lesion is 50% stenosed. Prox LAD to Mid LAD lesion is 20% stenosed. Ost Cx to Prox Cx lesion is 30% stenosed. 1st Mrg lesion is 40% stenosed. 2nd Diag lesion is 60% stenosed.   1.  Mild to moderate three-vessel coronary artery disease with severe calcifications.  No obstructive lesions. 2.  Left ventricular angiography was not performed.  EF was severely reduced by echo. 3.  Right heart catheterization showed minimally elevated filling pressures, mild pulmonary hypertension and normal cardiac output.   Recommendations: The patient has nonischemic cardiomyopathy. Continue aggressive medical  therapy. Volume status appears good. __________  2D echo 04/05/2021: 1. Left ventricular ejection fraction, by estimation, is 20 to 25%. The  left ventricle has severely decreased function. The left ventricle  demonstrates global hypokinesis. There is mild asymmetric left ventricular  hypertrophy. Left ventricular diastolic   parameters are consistent with Grade II diastolic dysfunction  (pseudonormalization). The average left ventricular global longitudinal  strain is -4.6 %. The global longitudinal strain is abnormal.   2. Right ventricular systolic function is moderately reduced. The right  ventricular size is normal.   3. The mitral valve is degenerative. Mild mitral valve regurgitation.   4. The aortic valve is tricuspid. Aortic valve regurgitation is  not  visualized. Mild to moderate aortic valve sclerosis/calcification is  present, without any evidence of aortic stenosis.   5. The inferior vena cava is dilated in size with <50% respiratory  variability, suggesting right atrial pressure of 15 mmHg.  Patient Profile     87 y.o. male with history of nonobstructive CAD by LHC in 03/2021, HFrEF secondary to NICM, complete heart block status post PPM, lung cancer s/p radiation, chronic hypoxic respiratory failure on supplemental oxygen at 3 L at baseline, splenic infarct on chronic OAC, AAA s/p engovascular grafting, PAD s/p multiple interventions, DM2, HTN, HLD, Bell's palsy, prostate cancer, vertigo, and OSA on CPAP who we are seeing for NSTEMI in the context of PNA.   Assessment & Plan    1. Multivessel nonobstructive CAD with NSTEMI: -No chest pain -Likely in the setting of his PNA with acute on chronic hypoxic respiratory failure requiring BiPAP -With significantly elevated troponin, stop Eliquis (on for prior splenic infarct), start heparin gtt with recommendation to treat for 48 hours -Trend troponin to peak -ASA, Toprol, Zetia, simvastatin  -Consider repeat cardiac cath once  his acute illness is improved with timing to be determined at this time, though his EF appears similar to prior studies dating back to the spring of 2022 -If he develops unstable angina or decompensates, may need to consider cath sooner, for now, he is stable  2. HFrEF secondary to NICM: -Appears grossly euvolemic  -Maintain net even to slightly negative fluid balance  -EF appears to be similar to his baseline dating back to the spring of 2020 (last normal documented EF in 2020) -IV Lasix 40 mg daily -PTA Toprol XL, Entresto, and spironolactone -Echo pending -Daily weights -Strict I/O  3. Complete heart block: -Status post PPM -Device appears to be functioning normally on telemetry  -Followed by EP  4. Acute on chronic hypoxic respiratory failure secondary to PNA: -Weaned off BiPAP, now back on nasal cannula at 5 L -Management per primary service  5. History of splenic infarct: -Hold PTA Eliquis -Heparin gtt for 48 hours as above -Resume PTA Eliquis prior to discharge once it is clear no inpatient invasive procedure will be needed  6. HTN: -Blood pressure well controlled -Continue medications as outlined above  7. HLD: -LDL 66 -Zetia and simvastatin    For questions or updates, please contact [place] Please consult [email or web address] for contact info under Cardiology/STEMI.    Signed, Christell Faith, PA-C [place] Pager: [phone] 10/25/2021, 8:35 AM

## 2021-10-25 NOTE — Consult Note (Signed)
ANTICOAGULATION CONSULT NOTE  Pharmacy Consult for Heparin Indication: chest pain/ACS  Patient Measurements: Weight: 84 kg (185 lb 3 oz) Heparin Dosing Weight: 84 kg  Labs: Recent Labs    10/24/21 0827 10/24/21 1036 10/24/21 1728 10/25/21 0446 10/25/21 0902 10/25/21 1033 10/25/21 1822  HGB 12.6*  --   --   --  11.1*  --   --   HCT 39.3  --   --   --  33.2*  --   --   PLT 282  --   --   --  208  --   --   APTT  --   --   --   --  42*  --  142*  LABPROT  --   --   --   --  17.4*  --   --   INR  --   --   --   --  1.4*  --   --   HEPARINUNFRC  --   --   --   --  >1.10*  --   --   CREATININE 1.27*  --   --  1.11  --   --   --   TROPONINIHS 70*   < > 12,859*  2,390*  --  10,829* 10,263*  --    < > = values in this interval not displayed.     Estimated Creatinine Clearance: 46 mL/min (by C-G formula based on SCr of 1.11 mg/dL).   Medical History: Past Medical History:  Diagnosis Date   Arthritis    Bell palsy    Bell's palsy 04/12/2015   Cancer [facility])    prostate and skin   Chronic combined systolic and diastolic CHF, NYHA class 1 ([place])    a. 07/2014 Echo: EF 35-40%, Gr 1 DD.   Complete heart block ([place])    a. 11/2010 s/p SJM 2210 [facility] DC PPM, ser# [id number].   Depression    Diabetes mellitus without complication ([place])    Fall 11-10-14   GERD (gastroesophageal reflux disease)    History of prostate cancer    Hyperlipidemia    Hypertension    LBBB (left bundle branch block)    Left-sided Bell's palsy    Lung cancer ([place]) 2016   NICM (nonischemic cardiomyopathy) ([place])    a. 07/2014 Echo: EF 35-40%, mid-apicalanteroseptal DK, Gr 1 DD, mild-mod dil LA.   Non-obstructive CAD    a. 07/2014 Abnl MV;  b. 08/2014 Cath: LM nl, LAD 30p, RI 40p, LCX nl, OM1 40, RCA dominant 30p, 70d-->Med Rx.   Poor balance    Presence of permanent cardiac pacemaker    Sleep apnea    a. cpap   Vertigo    WPW (Wolff-Parkinson-White syndrome)    a. S/P RFCA 1991.     Infusions:    sodium chloride     ceFEPime (MAXIPIME) IV 2 g (10/25/21 1104)   heparin       Assessment: Pharmacy consulted to start heparin for concern for ACS. Pt is on apixaban for hx of thromboembolism, splenic infarct. Will use aPTT for heparin monitoring as apixaban can falsely elevate heparin levels. Ordered baseline labs. CBC stable. Last dose of apixaban 11/2 @2256 .   1103 1822 aPTT 142s, suprathera; 1150 units/hr  Goal of Therapy:  Heparin level 0.3-0.7 units/ml aPTT 66-102 seconds Monitor platelets by anticoagulation protocol: Yes   Plan:  --Heparin level is supratherapeutic --Hold heparin infusion x 1 hour and then re-start at reduced infusion rate of 900  units/hr --Re-check aPTT 8 hours after re-initiation of infusion --Daily CBC per protocol while on IV heparin  Benita Gutter 10/25/2021,6:58 PM

## 2021-10-25 NOTE — Progress Notes (Signed)
PROGRESS NOTE    Howard [name]  LGX:[id number] DOB: [birth year] DOA: 10/24/2021 PCP: Jerrol Banana., MD   Brief Narrative: Taken from H&P.  Howard [name] is a 87 y.o. male with medical history significant of HFrEF, diabetes, gastric reflux, hypertension, hyperlipidemia, non-small cell lung cancer s/p radiation, chronic respiratory failure with emphysema, on 3 L of oxygen at home, CAD, presents to the emergency department with complaint of sudden onset shortness of breath and coughing spell where he did spit out mild blood-tinged thick sputum.  Patient completed his radiation treatment for lung cancer approximately 4 months ago. BNP markedly elevated at 4227, COVID-19 negative, procalcitonin negative, elevated lactic acid. CTA negative for PE but did show postradiation changes on right, and some new infiltrates.  Procalcitonin negative, troponin markedly elevated and peaked at 12 859. Cardiology and pulmonology both were consulted.  Subjective: Patient was feeling much improved when seen today.  Denies any chest pain or shortness of breath.  Wife at bedside.  Assessment & Plan:   Active Problems:   Acute on chronic HFrEF (heart failure with reduced ejection fraction) (HCC)  Acute on chronic hypoxic respiratory failure.  Baseline oxygen requirement of 3 L, currently on 5 L.  Weaned off from BiPAP.  Procalcitonin remain negative but pulmonology would like to continue cefepime at this time.  Might have a mucous plugging which was removed with bouts of coughing.  Cough has been improved.  Fungitell labs pending, Legionella and strep pneumo negative -Continue with cefepime -Continue supplemental oxygen and try weaning to baseline.  Acute on chronic HFrEF/elevated troponin/history of CAD/NSTEMI.  Prior echocardiogram with EF of 20 to 25%, repeat pending.  Troponin peaked at 12 859 which makes it at a NSTEMI range. Cardiology is on board-appreciate their help. -Continue with home aspirin,  Imdur, metoprolol and Entresto. -Continue with IV Lasix  Lactic acidosis.  Most likely secondary to hypoxia,-improving -Continue to monitor  History of non-small cell lung cancer.  S/p radiation, completed the course approximately 16-month ago. -Outpatient oncology follow-up   Hypertension. -Continue home meds as mentioned above.   Type 2 diabetes mellitus.  On metformin at home.  A1c of 8.2 -Hold metformin as he has lactic acidosis. -We will start [place] -Moderate scale SSI   Concern of depression/underlying dementia.  He was recently started on Zoloft which he has not taken yet. -Continue Aricept  Objective: Vitals:   10/25/21 0000 10/25/21 0220 10/25/21 0350 10/25/21 0735  BP: 118/63 (!) 111/59 104/69 123/69  Pulse: (!) 110 91 88 (!) 103  Resp: 20 18 20 18   Temp:  98.4 F (36.9 C) 98.3 F (36.8 C) 98.4 F (36.9 C)  TempSrc:      SpO2: 93% 95% 96% 95%  Weight:   84 kg     Intake/Output Summary (Last 24 hours) at 10/25/2021 0833 Last data filed at 10/25/2021 0600 Gross per 24 hour  Intake 603.1 ml  Output 650 ml  Net -46.9 ml   Filed Weights   10/24/21 0833 10/25/21 0350  Weight: 88.9 kg 84 kg    Examination:  General exam: Appears calm and comfortable  Respiratory system: Clear to auscultation. Respiratory effort normal. Cardiovascular system: S1 & S2 heard, RRR.  Gastrointestinal system: Soft, nontender, nondistended, bowel sounds positive. Central nervous system: Alert and oriented. No focal neurological deficits. Extremities: No edema, no cyanosis, pulses intact and symmetrical. Psychiatry: Judgement and insight appear normal.   DVT prophylaxis: Eliquis Code Status: Full Family Communication: Wife was updated at  bedside. Disposition Plan:  Status is: Inpatient  Remains inpatient appropriate because: Severity of illness  Level of care: Progressive Cardiac  All the records are reviewed and case discussed with Care Management/Social  Worker. Management plans discussed with the patient, nursing and they are in agreement.  Consultants:  Cardiology Pulmonology  Procedures:  Antimicrobials:  Cefepime  Data Reviewed: I have personally reviewed following labs and imaging studies  CBC: Recent Labs  Lab 10/24/21 0827  WBC 9.7  HGB 12.6*  HCT 39.3  MCV 85.4  PLT 244   Basic Metabolic Panel: Recent Labs  Lab 10/24/21 0827 10/25/21 0446  NA 139 139  K 4.1 3.9  CL 110 111  CO2 18* 20*  GLUCOSE 241* 160*  BUN 25* 25*  CREATININE 1.27* 1.11  CALCIUM 9.1 9.0   GFR: Estimated Creatinine Clearance: 46 mL/min (by C-G formula based on SCr of 1.11 mg/dL). Liver Function Tests: Recent Labs  Lab 10/24/21 0827  AST 21  ALT 15  ALKPHOS 49  BILITOT 0.8  PROT 7.2  ALBUMIN 3.8   No results for input(s): LIPASE, AMYLASE in the last 168 hours. No results for input(s): AMMONIA in the last 168 hours. Coagulation Profile: No results for input(s): INR, PROTIME in the last 168 hours. Cardiac Enzymes: No results for input(s): CKTOTAL, CKMB, CKMBINDEX, TROPONINI in the last 168 hours. BNP (last 3 results) No results for input(s): PROBNP in the last 8760 hours. HbA1C: Recent Labs    10/24/21 1728  HGBA1C 8.2*   CBG: Recent Labs  Lab 10/24/21 1718 10/24/21 2206 10/25/21 0736  GLUCAP 141* 165* 188*   Lipid Profile: No results for input(s): CHOL, HDL, LDLCALC, TRIG, CHOLHDL, LDLDIRECT in the last 72 hours. Thyroid Function Tests: No results for input(s): TSH, T4TOTAL, FREET4, T3FREE, THYROIDAB in the last 72 hours. Anemia Panel: No results for input(s): VITAMINB12, FOLATE, FERRITIN, TIBC, IRON, RETICCTPCT in the last 72 hours. Sepsis Labs: Recent Labs  Lab 10/24/21 0827 10/24/21 1435  PROCALCITON <0.10  --   LATICACIDVEN 3.1* 2.2*    Recent Results (from the past 240 hour(s))  Resp Panel by RT-PCR (Flu A&B, Covid) Nasopharyngeal Swab     Status: None   Collection Time: 10/24/21  8:27 AM    Specimen: Nasopharyngeal Swab; Nasopharyngeal(NP) swabs in vial transport medium  Result Value Ref Range Status   SARS Coronavirus 2 by RT PCR NEGATIVE NEGATIVE Final    Comment: (NOTE) SARS-CoV-2 target nucleic acids are NOT DETECTED.  The SARS-CoV-2 RNA is generally detectable in upper respiratory specimens during the acute phase of infection. The lowest concentration of SARS-CoV-2 viral copies this assay can detect is 138 copies/mL. A negative result does not preclude SARS-Cov-2 infection and should not be used as the sole basis for treatment or other patient management decisions. A negative result may occur with  improper specimen collection/handling, submission of specimen other than nasopharyngeal swab, presence of viral mutation(s) within the areas targeted by this assay, and inadequate number of viral copies(<138 copies/mL). A negative result must be combined with clinical observations, patient history, and epidemiological information. The expected result is Negative.  Fact Sheet for Patients:  EntrepreneurPulse.com.au  Fact Sheet for Healthcare Providers:  IncredibleEmployment.be  This test is no t yet approved or cleared by the [place] FDA and  has been authorized for detection and/or diagnosis of SARS-CoV-2 by FDA under an Emergency Use Authorization (EUA). This EUA will remain  in effect (meaning this test can be used) for the duration of the  COVID-19 declaration under Section 564(b)(1) of the Act, 21 U.S.C.section 360bbb-3(b)(1), unless the authorization is terminated  or revoked sooner.       Influenza A by PCR NEGATIVE NEGATIVE Final   Influenza B by PCR NEGATIVE NEGATIVE Final    Comment: (NOTE) The Xpert Xpress SARS-CoV-2/FLU/RSV plus assay is intended as an aid in the diagnosis of influenza from Nasopharyngeal swab specimens and should not be used as a sole basis for treatment. Nasal washings and aspirates are  unacceptable for Xpert Xpress SARS-CoV-2/FLU/RSV testing.  Fact Sheet for Patients: EntrepreneurPulse.com.au  Fact Sheet for Healthcare Providers: IncredibleEmployment.be  This test is not yet approved or cleared by the [place] FDA and has been authorized for detection and/or diagnosis of SARS-CoV-2 by FDA under an Emergency Use Authorization (EUA). This EUA will remain in effect (meaning this test can be used) for the duration of the COVID-19 declaration under Section 564(b)(1) of the Act, 21 U.S.C. section 360bbb-3(b)(1), unless the authorization is terminated or revoked.  Performed at [facility], [address] [place], [place] [address]   Blood culture (routine x 2)     Status: None (Preliminary result)   Collection Time: 10/24/21  9:47 AM   Specimen: BLOOD  Result Value Ref Range Status   Specimen Description BLOOD LEFT HAND  Final   Special Requests   Final    BOTTLES DRAWN AEROBIC AND ANAEROBIC Blood Culture results may not be optimal due to an inadequate volume of blood received in culture bottles   Culture   Final    NO GROWTH < 24 HOURS Performed at [facility], [address], [place], [place] [address]    Report Status PENDING  Incomplete  Blood culture (routine x 2)     Status: None (Preliminary result)   Collection Time: 10/24/21 10:04 AM   Specimen: BLOOD  Result Value Ref Range Status   Specimen Description BLOOD RIGHT FA  Final   Special Requests   Final    BOTTLES DRAWN AEROBIC AND ANAEROBIC Blood Culture results may not be optimal due to an inadequate volume of blood received in culture bottles   Culture   Final    NO GROWTH < 24 HOURS Performed at [facility], [address] [place], [place] [address]    Report Status PENDING  Incomplete  MRSA Next Gen by PCR, Nasal     Status: None   Collection Time: 10/24/21 10:04 AM   Specimen: Nasal Mucosa; Nasal Swab  Result Value Ref  Range Status   MRSA by PCR Next Gen NOT DETECTED NOT DETECTED Final    Comment: (NOTE) The GeneXpert MRSA Assay (FDA approved for NASAL specimens only), is one component of a comprehensive MRSA colonization surveillance program. It is not intended to diagnose MRSA infection nor to guide or monitor treatment for MRSA infections. Test performance is not FDA approved in patients less than 19 years old. Performed at [facility], [address] [place], [place] [address]      Radiology Studies: CT Angio Chest PE W and/or Wo Contrast  Result Date: 10/24/2021 CLINICAL DATA:  Acute onset shortness of breath. History of lung cancer. EXAM: CT ANGIOGRAPHY CHEST WITH CONTRAST TECHNIQUE: Multidetector CT imaging of the chest was performed using the standard protocol during bolus administration of intravenous contrast. Multiplanar CT image reconstructions and MIPs were obtained to evaluate the vascular anatomy. CONTRAST:  108mL OMNIPAQUE IOHEXOL 350 MG/ML SOLN COMPARISON:  CT chest dated September 12, 2021. FINDINGS: Cardiovascular:  Satisfactory opacification of the pulmonary arteries to the segmental level. No evidence of pulmonary embolism. Unchanged mild cardiomegaly. No pericardial effusion. No thoracic aortic aneurysm. Coronary, aortic arch, and branch vessel atherosclerotic vascular disease. Mediastinum/Nodes: No enlarged mediastinal, hilar, or axillary lymph nodes. Unchanged index subcarinal lymph node measuring 1.1 cm in short axis. Thyroid gland, trachea, and esophagus demonstrate no significant findings. Lungs/Pleura: New patchy ground-glass densities with areas of nodular consolidation in the right upper, right middle, and right lower lobes, with inter- and intralobular septal thickening. Post treatment related dense fibrotic consolidation bronchiectasis in the medial right upper and lower lobes has progressed. New small right and trace left pleural effusions. No pneumothorax. Unchanged  calcified scarring in the lingula and medial right middle lobe. Mild dependent subsegmental atelectasis in the posterior left lower lobe. Mild emphysema. New 9 x 8 mm spiculated density in the right upper lobe (series 6, image 22). Upper Abdomen: No acute abnormality. Musculoskeletal: No chest wall abnormality. No acute or significant osseous findings. Review of the MIP images confirms the above findings. IMPRESSION: 1. No evidence of pulmonary embolism. 2. New patchy ground-glass densities with areas of nodular consolidation in the right upper, right middle, and right lower lobes, with inter- and intralobular septal thickening. Differential considerations include multifocal pneumonia, asymmetric pulmonary edema, and pulmonary hemorrhage. 3. New small right and trace left pleural effusions. 4. New 9 x 8 mm spiculated density in the right upper lobe, presumably related to underlying acute process in the right lung. However, attention on follow-up imaging is recommended. 5. Aortic Atherosclerosis (ICD10-I70.0) and Emphysema (ICD10-J43.9). Electronically Signed   By: Titus Dubin M.D.   On: 10/24/2021 11:14   DG Chest Portable 1 View  Result Date: 10/24/2021 CLINICAL DATA:  Shortness of breath. EXAM: PORTABLE CHEST 1 VIEW COMPARISON:  CT chest dated September 12, 2021. Chest x-ray dated July 03, 2021. FINDINGS: Left chest wall pacemaker again noted. Unchanged mild cardiomegaly. New hazy airspace disease in the right mid and lower lung. Unchanged scarring in the left lung base and post treatment changes in the medial right lower lobe. No pleural effusion or pneumothorax. No acute osseous abnormality. IMPRESSION: 1. New hazy airspace disease in the right mid and lower lung concerning for pneumonia. Electronically Signed   By: Titus Dubin M.D.   On: 10/24/2021 08:54    Scheduled Meds:  apixaban  5 mg Oral BID   aspirin EC  81 mg Oral Daily   cholecalciferol  1,000 Units Oral Daily   donepezil  5 mg Oral  Daily   ezetimibe  10 mg Oral Daily   fluticasone furoate-vilanterol  1 puff Inhalation Daily   And   umeclidinium bromide  1 puff Inhalation Daily   furosemide  40 mg Intravenous Daily   insulin aspart  0-15 Units Subcutaneous TID WC   insulin aspart  0-5 Units Subcutaneous QHS   isosorbide mononitrate  30 mg Oral Daily   loratadine  10 mg Oral Daily   metoprolol succinate  25 mg Oral QHS   multivitamin with minerals  1 tablet Oral Daily   pantoprazole  40 mg Oral Daily   sacubitril-valsartan  1 tablet Oral BID   simvastatin  40 mg Oral QHS   sodium chloride flush  3 mL Intravenous Q12H   spironolactone  25 mg Oral Daily   Continuous Infusions:  sodium chloride     ceFEPime (MAXIPIME) IV 2 g (10/24/21 2256)     LOS: 1 day   Time spent: 45  minutes. More than 50% of the time was spent in counseling/coordination of care  Lorella Nimrod, MD [facility]  If 7PM-7AM, please contact night-coverage [email or web address]  10/25/2021, 8:33 AM   This record has been created using Systems analyst. Errors have been sought and corrected,but may not always be located. Such creation errors do not reflect on the standard of care.

## 2021-10-25 NOTE — Plan of Care (Signed)
  Problem: Health Behavior/Discharge Planning: Goal: Ability to manage health-related needs will improve Outcome: Progressing   Problem: Clinical Measurements: Goal: Ability to maintain clinical measurements within normal limits will improve Outcome: Progressing Goal: Will remain free from infection Outcome: Progressing Goal: Diagnostic test results will improve Outcome: Progressing Goal: Respiratory complications will improve Outcome: Progressing Goal: Cardiovascular complication will be avoided Outcome: Progressing   Problem: Activity: Goal: Risk for activity intolerance will decrease Outcome: Progressing   Problem: Clinical Measurements: Goal: Ability to maintain clinical measurements within normal limits will improve Outcome: Progressing   Problem: Clinical Measurements: Goal: Will remain free from infection Outcome: Progressing   Problem: Clinical Measurements: Goal: Diagnostic test results will improve Outcome: Progressing   Problem: Clinical Measurements: Goal: Cardiovascular complication will be avoided Outcome: Progressing   Problem: Activity: Goal: Risk for activity intolerance will decrease Outcome: Progressing

## 2021-10-25 NOTE — Progress Notes (Addendum)
Pulmonary Medicine          Date: 10/25/2021,   MRN# [id number] Howard [name] [birth year]     AdmissionWeight: 88.9 kg                 CurrentWeight: 84 kg   Referring physician: Dr Reesa Chew   CHIEF COMPLAINT:   Non-massive hemoptysis   HISTORY OF PRESENT ILLNESS   This is a pleasant 87 yo m with hx of CHF, DM, GERD, HTN, NCLC s/p SBRT, chronic hypoxemia on 3L O2, cAd who came in to ED with acute onset SOB. This happenend at home while at rest suddenly.  He has OSA and was placed on CPAP. He had CTPE done in hospital negative for PE.  His bloodwork showed severely elevated BNP. CT also showed new 66mm RUL nodule. He is being treated for pneumonia empirically and has PCCM consult for abnormal ct chest findings and non - massive hemoptysis.   10/25/21- patient improved hes on 5L/.  He is starting PT/OT.  His cough is better. Workup is in process. [place] cardiology consultation NSTEMI workup in progress.    PAST MEDICAL HISTORY   Past Medical History:  Diagnosis Date   Arthritis    Bell palsy    Bell's palsy 04/12/2015   Cancer [facility])    prostate and skin   Chronic combined systolic and diastolic CHF, NYHA class 1 ([place])    a. 07/2014 Echo: EF 35-40%, Gr 1 DD.   Complete heart block ([place])    a. 11/2010 s/p SJM 2210 [employer] DC PPM, ser# [id number].   Depression    Diabetes mellitus without complication ([place])    Fall 11-10-14   GERD (gastroesophageal reflux disease)    History of prostate cancer    Hyperlipidemia    Hypertension    LBBB (left bundle branch block)    Left-sided Bell's palsy    Lung cancer ([place]) 2016   NICM (nonischemic cardiomyopathy) ([place])    a. 07/2014 Echo: EF 35-40%, mid-apicalanteroseptal DK, Gr 1 DD, mild-mod dil LA.   Non-obstructive CAD    a. 07/2014 Abnl MV;  b. 08/2014 Cath: LM nl, LAD 30p, RI 40p, LCX nl, OM1 40, RCA dominant 30p, 70d-->Med Rx.   Poor balance    Presence of permanent cardiac pacemaker    Sleep apnea    a. cpap   Vertigo     WPW (Wolff-Parkinson-White syndrome)    a. S/P RFCA 1991.     SURGICAL HISTORY   Past Surgical History:  Procedure Laterality Date   ABDOMINAL AORTIC ENDOVASCULAR STENT GRAFT  08/25/2019   Procedure: ABDOMINAL AORTIC ENDOVASCULAR STENT GRAFT;  Surgeon: Algernon Huxley, MD;  Location: [facility] ORS;  Service: Vascular;;   ANGIOPLASTY Left 08/25/2019   Procedure: ANGIOPLASTY;  Surgeon: Algernon Huxley, MD;  Location: [facility] ORS;  Service: Vascular;  Laterality: Left;  left SFA and stent placement   APPLICATION OF WOUND VAC Left 06/07/2015   Procedure: APPLICATION OF WOUND VAC;  Surgeon: Robert Bellow, MD;  Location: [facility] ORS;  Service: General;  Laterality: Left;  left upper back   [place]  08/26/2014   Single vessel obstructive CAD   CARPAL TUNNEL RELEASE  04-04-15   [facility]   CATARACT EXTRACTION  07-31-11 and 09-18-11   Catheter ablation  1991   for WPW   cervical fusion     CHOLECYSTECTOMY  09-07-14   ENDARTERECTOMY FEMORAL Left 08/25/2019  Procedure: ENDARTERECTOMY FEMORAL;  Surgeon: Algernon Huxley, MD;  Location: [facility] ORS;  Service: Vascular;  Laterality: Left;  common and produndis    ENDOVASCULAR REPAIR/STENT GRAFT Right 08/25/2019   Procedure: ENDOVASCULAR REPAIR/STENT GRAFT;  Surgeon: Algernon Huxley, MD;  Location: [facility] ORS;  Service: Vascular;  Laterality: Right;  renal artery   HAND SURGERY     right 1993; left 2005   [place] / REPLACE / REMOVE PACEMAKER     INSERTION OF ILIAC STENT Bilateral 08/25/2019   Procedure: INSERTION OF ILIAC STENT;  Surgeon: Algernon Huxley, MD;  Location: [facility] ORS;  Service: Vascular;  Laterality: Bilateral;   JOINT REPLACEMENT Left 2013   knee   JOINT REPLACEMENT Right 2004   knee   KNEE SURGERY     left knee 1991 and 1992; right knee [place] N/A 08/26/2014   Procedure: LEFT HEART CATHETERIZATION WITH CORONARY ANGIOGRAM;  Surgeon: [name], MD;  Location:  [facility] CATH LAB;  Service: Cardiovascular;  Laterality: N/A;   LOWER EXTREMITY ANGIOGRAPHY Left 08/23/2019   Procedure: Lower Extremity Angiography;  Surgeon: Algernon Huxley, MD;  Location: [place] CV LAB;  Service: Cardiovascular;  Laterality: Left;   LUMBAR LAMINECTOMY/DECOMPRESSION MICRODISCECTOMY N/A 06/07/2014   Procedure: LUMBAR FOUR TO FIVE LUMBAR LAMINECTOMY/DECOMPRESSION MICRODISCECTOMY 1 LEVEL;  Surgeon: [name], MD;  Location: [place] NEURO ORS;  Service: Neurosurgery;  Laterality: N/A;   LUNG BIOPSY Right 2016   Dr [name]   MOHS SURGERY     PACEMAKER INSERTION     PPM-- St Jude 11/30/10 by [name]   PPM GENERATOR CHANGEOUT N/A 07/09/2019   Procedure: PPM GENERATOR CHANGEOUT;  Surgeon: Deboraha Sprang, MD;  Location: [place] CV LAB;  Service: Cardiovascular;  Laterality: N/A;   PROSTATE SURGERY     cancer--1998, prostatectomy   REPLACEMENT TOTAL KNEE     2004   RIGHT/LEFT HEART CATH AND CORONARY ANGIOGRAPHY Bilateral 04/13/2021   Procedure: RIGHT/LEFT HEART CATH AND CORONARY ANGIOGRAPHY;  Surgeon: Wellington Hampshire, MD;  Location: [place] CV LAB;  Service: Cardiovascular;  Laterality: Bilateral;   ruptured disc     1962 and 1998   TEE WITHOUT CARDIOVERSION N/A 09/01/2019   Procedure: TRANSESOPHAGEAL ECHOCARDIOGRAM (TEE);  Surgeon: [name], MD;  Location: [facility] ORS;  Service: Cardiovascular;  Laterality: N/A;   TEMPORARY PACEMAKER N/A 07/09/2019   Procedure: TEMPORARY PACEMAKER;  Surgeon: Deboraha Sprang, MD;  Location: [place] CV LAB;  Service: Cardiovascular;  Laterality: N/A;   TRIGGER FINGER RELEASE  01-24-15   WOUND DEBRIDEMENT Left 06/07/2015   Procedure: DEBRIDEMENT WOUND;  Surgeon: Robert Bellow, MD;  Location: [facility] ORS;  Service: General;  Laterality: Left;  left upper back     FAMILY HISTORY   Family History  Problem Relation Age of Onset   Heart attack Mother    Hyperlipidemia Mother    CAD Other    Prostate cancer Neg Hx    Cancer Neg Hx       SOCIAL HISTORY   Social History   Tobacco Use   Smoking status: Former    Packs/day: 1.00    Years: 56.00    Pack years: 56.00    Types: Cigarettes    Quit date: 2011    Years since quitting: 11.8   Smokeless tobacco: Never  Vaping Use   Vaping Use: Never used  Substance Use Topics   Alcohol use: No  Drug use: No     MEDICATIONS    Home Medication:  REM   Current Medication:  Current Facility-Administered Medications:    0.9 %  sodium chloride infusion, 250 mL, Intravenous, PRN, Lorella Nimrod, MD   acetaminophen (TYLENOL) tablet 650 mg, 650 mg, Oral, Q4H PRN, Lorella Nimrod, MD   aspirin EC tablet 81 mg, 81 mg, Oral, Daily, Lorella Nimrod, MD, 81 mg at 10/24/21 1705   ceFEPIme (MAXIPIME) 2 g in sodium chloride 0.9 % 100 mL IVPB, 2 g, Intravenous, Q12H, Nazari, Walid A, RPH, Last Rate: 200 mL/hr at 10/24/21 2256, 2 g at 10/24/21 2256   cholecalciferol (VITAMIN D3) tablet 1,000 Units, 1,000 Units, Oral, Daily, Lorella Nimrod, MD, 1,000 Units at 10/24/21 1704   donepezil (ARICEPT) tablet 5 mg, 5 mg, Oral, Daily, Lorella Nimrod, MD, 5 mg at 10/24/21 1706   ezetimibe (ZETIA) tablet 10 mg, 10 mg, Oral, Daily, Lorella Nimrod, MD, 10 mg at 10/24/21 1703   fluticasone furoate-vilanterol (BREO ELLIPTA) 100-25 MCG/ACT 1 puff, 1 puff, Inhalation, Daily, 1 puff at 10/24/21 1712 **AND** umeclidinium bromide (INCRUSE ELLIPTA) 62.5 MCG/ACT 1 puff, 1 puff, Inhalation, Daily, Lorella Nimrod, MD, 1 puff at 10/24/21 1716   furosemide (LASIX) injection 40 mg, 40 mg, Intravenous, Daily, Lorella Nimrod, MD   heparin ADULT infusion 100 units/mL (25000 units/283mL), 1,150 Units/hr, Intravenous, Continuous, Patel, Kishan S, RPH   heparin bolus via infusion 4,000 Units, 4,000 Units, Intravenous, Once, Eleonore Chiquito S, RPH   insulin aspart (novoLOG) injection 0-15 Units, 0-15 Units, Subcutaneous, TID WC, Lorella Nimrod, MD, 2 Units at 10/24/21 1724   insulin aspart (novoLOG) injection 0-5 Units, 0-5  Units, Subcutaneous, QHS, Amin, Soundra Pilon, MD   isosorbide mononitrate (IMDUR) 24 hr tablet 30 mg, 30 mg, Oral, Daily, Lorella Nimrod, MD, 30 mg at 10/24/21 1704   loratadine (CLARITIN) tablet 10 mg, 10 mg, Oral, Daily, Lorella Nimrod, MD, 10 mg at 10/24/21 1707   metoprolol succinate (TOPROL-XL) 24 hr tablet 25 mg, 25 mg, Oral, QHS, Amin, Soundra Pilon, MD, 25 mg at 10/24/21 2257   montelukast (SINGULAIR) tablet 10 mg, 10 mg, Oral, Daily PRN, Lorella Nimrod, MD   multivitamin with minerals tablet 1 tablet, 1 tablet, Oral, Daily, Lorella Nimrod, MD, 1 tablet at 10/24/21 1705   ondansetron (ZOFRAN) injection 4 mg, 4 mg, Intravenous, Q6H PRN, Lorella Nimrod, MD   pantoprazole (PROTONIX) EC tablet 40 mg, 40 mg, Oral, Daily, Lorella Nimrod, MD, 40 mg at 10/24/21 1706   sacubitril-valsartan (ENTRESTO) 49-51 mg per tablet, 1 tablet, Oral, BID, Lorella Nimrod, MD, 1 tablet at 10/24/21 2257   simvastatin (ZOCOR) tablet 40 mg, 40 mg, Oral, QHS, Amin, Soundra Pilon, MD, 40 mg at 10/24/21 2257   sodium chloride flush (NS) 0.9 % injection 3 mL, 3 mL, Intravenous, Q12H, Lorella Nimrod, MD, 3 mL at 10/24/21 2153   sodium chloride flush (NS) 0.9 % injection 3 mL, 3 mL, Intravenous, PRN, Lorella Nimrod, MD   spironolactone (ALDACTONE) tablet 25 mg, 25 mg, Oral, Daily, Lorella Nimrod, MD, 25 mg at 10/24/21 1706    ALLERGIES   Sulfa antibiotics     REVIEW OF SYSTEMS    Review of Systems:  Gen:  Denies  fever, sweats, chills weigh loss  HEENT: Denies blurred vision, double vision, ear pain, eye pain, hearing loss, nose bleeds, sore throat Cardiac:  No dizziness, chest pain or heaviness, chest tightness,edema Resp:   Denies cough or sputum porduction, shortness of breath,wheezing, hemoptysis,  Gi: Denies swallowing difficulty, stomach pain, nausea or  vomiting, diarrhea, constipation, bowel incontinence Gu:  Denies bladder incontinence, burning urine Ext:   Denies Joint pain, stiffness or swelling Skin: Denies  skin rash,  easy bruising or bleeding or hives Endoc:  Denies polyuria, polydipsia , polyphagia or weight change Psych:   Denies depression, insomnia or hallucinations   Other:  All other systems negative   VS: BP 123/69 (BP Location: Right Arm)   Pulse (!) 103   Temp 98.4 F (36.9 C)   Resp 18   Wt 84 kg   SpO2 95%   BMI 27.35 kg/m      PHYSICAL EXAM    GENERAL:NAD, no fevers, chills, no weakness no fatigue HEAD: Normocephalic, atraumatic.  EYES: Pupils equal, round, reactive to light. Extraocular muscles intact. No scleral icterus.  MOUTH: Moist mucosal membrane. Dentition intact. No abscess noted.  EAR, NOSE, THROAT: Clear without exudates. No external lesions.  NECK: Supple. No thyromegaly. No nodules. No JVD.  PULMONARY: Diffuse coarse rhonchi right sided +wheezes CARDIOVASCULAR: S1 and S2. Regular rate and rhythm. No murmurs, rubs, or gallops. No edema. Pedal pulses 2+ bilaterally.  GASTROINTESTINAL: Soft, nontender, nondistended. No masses. Positive bowel sounds. No hepatosplenomegaly.  MUSCULOSKELETAL: No swelling, clubbing, or edema. Range of motion full in all extremities.  NEUROLOGIC: Cranial nerves II through XII are intact. No gross focal neurological deficits. Sensation intact. Reflexes intact.  SKIN: No ulceration, lesions, rashes, or cyanosis. Skin warm and dry. Turgor intact.  PSYCHIATRIC: Mood, affect within normal limits. The patient is awake, alert and oriented x 3. Insight, judgment intact.       IMAGING    CT Angio Chest PE W and/or Wo Contrast  Result Date: 10/24/2021 CLINICAL DATA:  Acute onset shortness of breath. History of lung cancer. EXAM: CT ANGIOGRAPHY CHEST WITH CONTRAST TECHNIQUE: Multidetector CT imaging of the chest was performed using the standard protocol during bolus administration of intravenous contrast. Multiplanar CT image reconstructions and MIPs were obtained to evaluate the vascular anatomy. CONTRAST:  36mL OMNIPAQUE IOHEXOL 350 MG/ML SOLN  COMPARISON:  CT chest dated September 12, 2021. FINDINGS: Cardiovascular: Satisfactory opacification of the pulmonary arteries to the segmental level. No evidence of pulmonary embolism. Unchanged mild cardiomegaly. No pericardial effusion. No thoracic aortic aneurysm. Coronary, aortic arch, and branch vessel atherosclerotic vascular disease. Mediastinum/Nodes: No enlarged mediastinal, hilar, or axillary lymph nodes. Unchanged index subcarinal lymph node measuring 1.1 cm in short axis. Thyroid gland, trachea, and esophagus demonstrate no significant findings. Lungs/Pleura: New patchy ground-glass densities with areas of nodular consolidation in the right upper, right middle, and right lower lobes, with inter- and intralobular septal thickening. Post treatment related dense fibrotic consolidation bronchiectasis in the medial right upper and lower lobes has progressed. New small right and trace left pleural effusions. No pneumothorax. Unchanged calcified scarring in the lingula and medial right middle lobe. Mild dependent subsegmental atelectasis in the posterior left lower lobe. Mild emphysema. New 9 x 8 mm spiculated density in the right upper lobe (series 6, image 22). Upper Abdomen: No acute abnormality. Musculoskeletal: No chest wall abnormality. No acute or significant osseous findings. Review of the MIP images confirms the above findings. IMPRESSION: 1. No evidence of pulmonary embolism. 2. New patchy ground-glass densities with areas of nodular consolidation in the right upper, right middle, and right lower lobes, with inter- and intralobular septal thickening. Differential considerations include multifocal pneumonia, asymmetric pulmonary edema, and pulmonary hemorrhage. 3. New small right and trace left pleural effusions. 4. New 9 x 8 mm spiculated density in the  right upper lobe, presumably related to underlying acute process in the right lung. However, attention on follow-up imaging is recommended. 5. Aortic  Atherosclerosis (ICD10-I70.0) and Emphysema (ICD10-J43.9). Electronically Signed   By: Titus Dubin M.D.   On: 10/24/2021 11:14   DG Chest Portable 1 View  Result Date: 10/24/2021 CLINICAL DATA:  Shortness of breath. EXAM: PORTABLE CHEST 1 VIEW COMPARISON:  CT chest dated September 12, 2021. Chest x-ray dated July 03, 2021. FINDINGS: Left chest wall pacemaker again noted. Unchanged mild cardiomegaly. New hazy airspace disease in the right mid and lower lung. Unchanged scarring in the left lung base and post treatment changes in the medial right lower lobe. No pleural effusion or pneumothorax. No acute osseous abnormality. IMPRESSION: 1. New hazy airspace disease in the right mid and lower lung concerning for pneumonia. Electronically Signed   By: Titus Dubin M.D.   On: 10/24/2021 08:54   CUP PACEART REMOTE DEVICE CHECK  Result Date: 10/09/2021 Scheduled remote reviewed. Normal device function.  14 AMS, <10sec PAT 1 PMT appropriately treated without termination Next remote 91 days. LR     ASSESSMENT/PLAN   Acute hypoxemic respiratory failure - present on admission  - COVID19 negative -there is acute on chornic CHF but infiltrate is almost exclusively on right lung which suggests pneumonia as primary inciting event.   -Procalcitonin is normal and there is no leukocytosis or fevers to suggest active infection - supplemental O2 during my evaluation BIPAP 100% - infectious workup for pneumonia as below: -Respiratory viral panel -serum fungitell -legionella ab -strep pneumoniae ur AG -Histoplasma Ur Ag -sputum resp cultures -AFB sputum expectorated specimen -sputum cytology  -reviewed pertinent imaging with patient today - CRP -PT/OT for d/c planning  -please encourage patient to use incentive spirometer few times each hour while hospitalized.   -ABG -appreciate cardiology consultation input   NSTEMI  - medical management at this time  - cardiology on case appreciate input    - no contraindications to anticoagulation in context of hemoptysis from pulmonary perspective. OK to have antiplatelet agents.    Thank you for allowing me to participate in the care of this patient.    Patient/Family are satisfied with care plan and all questions have been answered.  This document was prepared using Dragon voice recognition software and may include unintentional dictation errors.     Ottie Glazier, M.D.  Division of [place]

## 2021-10-25 NOTE — Plan of Care (Signed)
New admit A&OX1 , confused, VSS, hard of hearing, patient does not have his hearing aids with him.Unable to complete admission assessment questions due to patient hearing impair. Monitored closely  Problem: Health Behavior/Discharge Planning: Goal: Ability to manage health-related needs will improve 10/25/2021 0653 by Daryll Brod, RN Outcome: Progressing 10/25/2021 0644 by Daryll Brod, RN Outcome: Progressing   Problem: Clinical Measurements: Goal: Ability to maintain clinical measurements within normal limits will improve 10/25/2021 0653 by Daryll Brod, RN Outcome: Progressing 10/25/2021 0644 by Daryll Brod, RN Outcome: Progressing Goal: Will remain free from infection 10/25/2021 0653 by Daryll Brod, RN Outcome: Progressing 10/25/2021 0644 by Daryll Brod, RN Outcome: Progressing Goal: Diagnostic test results will improve 10/25/2021 0653 by Daryll Brod, RN Outcome: Progressing 10/25/2021 0644 by Daryll Brod, RN Outcome: Progressing Goal: Respiratory complications will improve 10/25/2021 0653 by Daryll Brod, RN Outcome: Progressing 10/25/2021 0644 by Daryll Brod, RN Outcome: Progressing Goal: Cardiovascular complication will be avoided 10/25/2021 0653 by Daryll Brod, RN Outcome: Progressing 10/25/2021 0644 by Daryll Brod, RN Outcome: Progressing   Problem: Activity: Goal: Risk for activity intolerance will decrease 10/25/2021 0653 by Daryll Brod, RN Outcome: Progressing 10/25/2021 0644 by Daryll Brod, RN Outcome: Progressing   Problem: Clinical Measurements: Goal: Will remain free from infection 10/25/2021 0653 by Daryll Brod, RN Outcome: Progressing 10/25/2021 0644 by Daryll Brod, RN Outcome: Progressing   Problem: Clinical Measurements: Goal: Diagnostic test results will improve 10/25/2021 0653 by Daryll Brod, RN Outcome: Progressing 10/25/2021 0644 by Daryll Brod, RN Outcome: Progressing   Problem: Clinical Measurements: Goal: Respiratory  complications will improve 10/25/2021 0653 by Daryll Brod, RN Outcome: Progressing 10/25/2021 0644 by Daryll Brod, RN Outcome: Progressing

## 2021-10-25 NOTE — Consult Note (Signed)
ANTICOAGULATION CONSULT NOTE - Initial Consult  Pharmacy Consult for Heparin Indication: chest pain/ACS  Allergies  Allergen Reactions   Sulfa Antibiotics Rash    Patient Measurements: Weight: 84 kg (185 lb 3 oz) Heparin Dosing Weight: 84 kg  Vital Signs: Temp: 98.4 F (36.9 C) (11/03 0735) BP: 123/69 (11/03 0735) Pulse Rate: 103 (11/03 0735)  Labs: Recent Labs    10/24/21 0827 10/24/21 1036 10/24/21 1728 10/25/21 0446  HGB 12.6*  --   --   --   HCT 39.3  --   --   --   PLT 282  --   --   --   CREATININE 1.27*  --   --  1.11  TROPONINIHS 70* 180* 12,859*  2,390*  --     Estimated Creatinine Clearance: 46 mL/min (by C-G formula based on SCr of 1.11 mg/dL).   Medical History: Past Medical History:  Diagnosis Date   Arthritis    Bell palsy    Bell's palsy 04/12/2015   Cancer [facility])    prostate and skin   Chronic combined systolic and diastolic CHF, NYHA class 1 ([place])    a. 07/2014 Echo: EF 35-40%, Gr 1 DD.   Complete heart block ([place])    a. 11/2010 s/p SJM 2210 [facility] PPM, ser# [id number].   Depression    Diabetes mellitus without complication ([employer])    Fall 11-10-14   GERD (gastroesophageal reflux disease)    History of prostate cancer    Hyperlipidemia    Hypertension    LBBB (left bundle branch block)    Left-sided Bell's palsy    Lung cancer ([place]) 2016   NICM (nonischemic cardiomyopathy) ([place])    a. 07/2014 Echo: EF 35-40%, mid-apicalanteroseptal DK, Gr 1 DD, mild-mod dil LA.   Non-obstructive CAD    a. 07/2014 Abnl MV;  b. 08/2014 Cath: LM nl, LAD 30p, RI 40p, LCX nl, OM1 40, RCA dominant 30p, 70d-->Med Rx.   Poor balance    Presence of permanent cardiac pacemaker    Sleep apnea    a. cpap   Vertigo    WPW (Wolff-Parkinson-White syndrome)    a. S/P RFCA 1991.    Medications:  Medications Prior to Admission  Medication Sig Dispense Refill Last Dose   acetaminophen (TYLENOL) 500 MG tablet Take 1,000 mg by mouth every 6 (six) hours as needed for  moderate pain.   10/23/2021 at prn   albuterol (VENTOLIN HFA) 108 (90 Base) MCG/ACT inhaler INHALE 2 PUFFS INTO LUNGS EVERY 6 HOURS AS NEEDED FOR WHEEZING OR SHORTNESS OF BREATH 8.5 g 6 10/24/2021 at am   aspirin EC 81 MG tablet Take 81 mg by mouth daily.   10/23/2021 at am   cetirizine (ZYRTEC) 10 MG tablet Take 10 mg by mouth daily.   10/23/2021 at am   Cholecalciferol (VITAMIN D3) 1000 UNITS CAPS Take 1,000 Units by mouth daily.   10/23/2021 at am   ELIQUIS 5 MG TABS tablet TAKE 1 TABLET TWICE A DAY  (SWITCHED FROM PLAVIX) (Patient taking differently: Take 5 mg by mouth 2 (two) times daily.) 60 tablet 11 10/23/2021 at pm   ezetimibe (ZETIA) 10 MG tablet TAKE 1 TABLET DAILY 90 tablet 0 10/23/2021 at am   Fluticasone-Umeclidin-Vilant (TRELEGY ELLIPTA) 100-62.5-25 MCG/INH AEPB Inhale 1 puff into the lungs daily. 180 each 3 10/23/2021   furosemide (LASIX) 20 MG tablet TAKE 1 TABLET BY MOUTH ONCE DAILY 30 tablet 5 10/23/2021 at am   Iron-Vitamin C (VITRON-C) 65-125 MG  TABS Take 1 tablet by mouth daily. 30 tablet 2 10/23/2021 at am   isosorbide mononitrate (IMDUR) 30 MG 24 hr tablet TAKE 1 TABLET DAILY 90 tablet 2 10/23/2021 at am   megestrol (MEGACE) 40 MG tablet Take 1 tablet (40 mg total) by mouth 2 (two) times daily. 60 tablet 2 10/23/2021 at pm   metFORMIN (GLUCOPHAGE) 500 MG tablet Take 1 tablet (500 mg total) by mouth 2 (two) times daily with a meal. 60 tablet 5 10/23/2021 at pm   metoprolol succinate (TOPROL-XL) 25 MG 24 hr tablet TAKE 1 TABLET AT BEDTIME 90 tablet 0 10/23/2021 at am   montelukast (SINGULAIR) 10 MG tablet Take 10 mg by mouth daily as needed (sneezing/allergies.).   10/23/2021 at pm   Multiple Vitamin (MULTIVITAMIN WITH MINERALS) TABS tablet Take 1 tablet by mouth daily. One-A-Day Multivitamin   10/23/2021 at am   omeprazole (PRILOSEC) 40 MG capsule TAKE 1 CAPSULE TWICE DAILY AS NEEDED 180 capsule 3 10/23/2021 at am   sacubitril-valsartan (ENTRESTO) 49-51 MG Take 1 tablet by mouth 2 (two) times  daily. 180 tablet 3 10/23/2021 at pm   simvastatin (ZOCOR) 40 MG tablet TAKE 1 TABLET AT BEDTIME 90 tablet 3 10/23/2021 at pm   spironolactone (ALDACTONE) 25 MG tablet Take 1 tablet (25 mg total) by mouth daily. 90 tablet 3 10/23/2021 at am   donepezil (ARICEPT) 5 MG tablet Take 1 tablet (5 mg total) by mouth daily. 30 tablet 12    promethazine-dextromethorphan (PROMETHAZINE-DM) 6.25-15 MG/5ML syrup Take 5 mLs by mouth 3 (three) times daily as needed for cough. (Patient not taking: No sig reported) 118 mL 0    sertraline (ZOLOFT) 50 MG tablet Take 1 tablet (50 mg total) by mouth daily. 30 tablet 3    Scheduled:   aspirin EC  81 mg Oral Daily   cholecalciferol  1,000 Units Oral Daily   donepezil  5 mg Oral Daily   ezetimibe  10 mg Oral Daily   fluticasone furoate-vilanterol  1 puff Inhalation Daily   And   umeclidinium bromide  1 puff Inhalation Daily   furosemide  40 mg Intravenous Daily   insulin aspart  0-15 Units Subcutaneous TID WC   insulin aspart  0-5 Units Subcutaneous QHS   isosorbide mononitrate  30 mg Oral Daily   loratadine  10 mg Oral Daily   metoprolol succinate  25 mg Oral QHS   multivitamin with minerals  1 tablet Oral Daily   pantoprazole  40 mg Oral Daily   sacubitril-valsartan  1 tablet Oral BID   simvastatin  40 mg Oral QHS   sodium chloride flush  3 mL Intravenous Q12H   spironolactone  25 mg Oral Daily   Infusions:   sodium chloride     ceFEPime (MAXIPIME) IV 2 g (10/24/21 2256)   PRN: sodium chloride, acetaminophen, montelukast, ondansetron (ZOFRAN) IV, sodium chloride flush Anti-infectives (From admission, onward)    Start     Dose/Rate Route Frequency Ordered Stop   10/24/21 2200  ceFEPIme (MAXIPIME) 2 g in sodium chloride 0.9 % 100 mL IVPB        2 g 200 mL/hr over 30 Minutes Intravenous Every 12 hours 10/24/21 1614     10/24/21 1100  vancomycin (VANCOCIN) IVPB 1000 mg/200 mL premix       See Hyperspace for full Linked Orders Report.   1,000 mg 200  mL/hr over 60 Minutes Intravenous  Once 10/24/21 0939 10/24/21 1229   10/24/21 0945  azithromycin (ZITHROMAX)  500 mg in sodium chloride 0.9 % 250 mL IVPB  Status:  Discontinued        500 mg 250 mL/hr over 60 Minutes Intravenous  Once 10/24/21 0932 10/24/21 0932   10/24/21 0945  cefTRIAXone (ROCEPHIN) 1 g in sodium chloride 0.9 % 100 mL IVPB  Status:  Discontinued        1 g 200 mL/hr over 30 Minutes Intravenous  Once 10/24/21 0932 10/24/21 0932   10/24/21 0945  vancomycin (VANCOCIN) IVPB 1000 mg/200 mL premix  Status:  Discontinued        1,000 mg 200 mL/hr over 60 Minutes Intravenous  Once 10/24/21 0933 10/24/21 0939   10/24/21 0945  ceFEPIme (MAXIPIME) 2 g in sodium chloride 0.9 % 100 mL IVPB        2 g 200 mL/hr over 30 Minutes Intravenous  Once 10/24/21 0933 10/24/21 1122   10/24/21 0945  vancomycin (VANCOCIN) IVPB 1000 mg/200 mL premix       See Hyperspace for full Linked Orders Report.   1,000 mg 200 mL/hr over 60 Minutes Intravenous  Once 10/24/21 5284 10/24/21 1220       Assessment: Pharmacy consulted to start heparin for concern for ACS. Pt is on apixaban for hx of thromboembolism, splenic infarct. Will use aPTT for heparin monitoring as apixaban can falsely elevate heparin levels. Ordered baseline labs. CBC stable. Last dose of apixaban 11/2 @2256 .   Goal of Therapy:  Heparin level 0.3-0.7 units/ml aPTT 66-102 seconds Monitor platelets by anticoagulation protocol: Yes   Plan:  Give 4000 units bolus x 1 Start heparin infusion at 1150 units/hr Check anti-Xa level in 8 hours and daily while on heparin Continue to monitor H&H and platelets  [name], PharmD, BCPS 10/25/2021,8:44 AM

## 2021-10-25 NOTE — Evaluation (Signed)
Physical Therapy Evaluation Patient Details Name: Howard [name] MRN: [id number] DOB: 08-19-1931 Today's Date: 10/25/2021  History of Present Illness  Howard [name] is a 87 y.o. male with medical history significant of HFrEF, diabetes, gastric reflux, hypertension, hyperlipidemia, non-small cell lung cancer s/p radiation, chronic respiratory failure with emphysema, on 3 L of oxygen at home, CAD, presents to the emergency department with complaint of sudden onset shortness of breath.   Clinical Impression  Patient received in bed, agrees to PT assessment. Patient is mod independent with bed mobility. Slightly impulsive throughout session. Requires supervision to min assist with gait and transfers. He ambulated in room with RW for 30 feet with min guard and assist to manage lines. O2 sats dropped to high 80%s with mobility on 5 liters. Patient will continue to benefit from skilled PT while here to improve activity tolerance.  He will return home with Outpatient PT which he was receiving prior to admission.       Recommendations for follow up therapy are one component of a multi-disciplinary discharge planning process, led by the attending physician.  Recommendations may be updated based on patient status, additional functional criteria and insurance authorization.  Follow Up Recommendations Outpatient PT    Assistance Recommended at Discharge Intermittent Supervision/Assistance  Functional Status Assessment Patient has had a recent decline in their functional status and demonstrates the ability to make significant improvements in function in a reasonable and predictable amount of time.  Equipment Recommendations  None recommended by PT    Recommendations for Other Services       Precautions / Restrictions Precautions Precautions: Fall Restrictions Weight Bearing Restrictions: No      Mobility  Bed Mobility Overal bed mobility: Modified Independent                   Transfers Overall transfer level: Needs assistance Equipment used: Rolling walker (2 wheels) Transfers: Sit to/from Stand Sit to Stand: Min guard           General transfer comment: patient requires min guard for safety with all mobility. Slightly impulsive    Ambulation/Gait Ambulation/Gait assistance: Min guard Gait Distance (Feet): 30 Feet Assistive device: Rolling walker (2 wheels) Gait Pattern/deviations: Step-through pattern Gait velocity: quick pace   General Gait Details: impulsive with mobility  Stairs            Wheelchair Mobility    Modified Rankin (Stroke Patients Only)       Balance Overall balance assessment: History of Falls;Needs assistance Sitting-balance support: Feet supported Sitting balance-Leahy Scale: Good     Standing balance support: Bilateral upper extremity supported;During functional activity Standing balance-Leahy Scale: Fair Standing balance comment: impulsive, decreased safety awareness                             Pertinent Vitals/Pain Pain Assessment: No/denies pain    Home Living Family/patient expects to be discharged to:: Private residence Living Arrangements: Spouse/significant other Available Help at Discharge: Family;Available 24 hours/day Type of Home: House Home Access: Stairs to enter Entrance Stairs-Rails: Right;Left;Can reach both Entrance Stairs-Number of Steps: 3   Home Layout: Laundry or work area in basement;One level Home Equipment: Rollator (4 wheels)      Prior Function Prior Level of Function : Independent/Modified Independent;History of Falls (last six months)             Mobility Comments: has not driven in last 6 mo  Hand Dominance   Dominant Hand: Right    Extremity/Trunk Assessment   Upper Extremity Assessment Upper Extremity Assessment: Defer to OT evaluation    Lower Extremity Assessment Lower Extremity Assessment: Overall WFL for tasks assessed     Cervical / Trunk Assessment Cervical / Trunk Assessment: Normal  Communication   Communication: HOH  Cognition Arousal/Alertness: Awake/alert Behavior During Therapy: WFL for tasks assessed/performed Overall Cognitive Status: Within Functional Limits for tasks assessed                                          General Comments      Exercises     Assessment/Plan    PT Assessment Patient needs continued PT services  PT Problem List Decreased strength;Decreased mobility;Decreased balance;Decreased knowledge of precautions;Decreased safety awareness;Decreased activity tolerance       PT Treatment Interventions Therapeutic activities;Gait training;Therapeutic exercise;Functional mobility training;Stair training;Patient/family education    PT Goals (Current goals can be found in the Care Plan section)  Acute Rehab PT Goals Patient Stated Goal: to return home PT Goal Formulation: With patient/family Time For Goal Achievement: 11/01/21 Potential to Achieve Goals: Good    Frequency Min 2X/week   Barriers to discharge        Co-evaluation               AM-PAC PT "6 Clicks" Mobility  Outcome Measure Help needed turning from your back to your side while in a flat bed without using bedrails?: None Help needed moving from lying on your back to sitting on the side of a flat bed without using bedrails?: A Little Help needed moving to and from a bed to a chair (including a wheelchair)?: A Little Help needed standing up from a chair using your arms (e.g., wheelchair or bedside chair)?: A Little Help needed to walk in hospital room?: A Little Help needed climbing 3-5 steps with a railing? : A Little 6 Click Score: 19    End of Session Equipment Utilized During Treatment: Gait belt;Oxygen Activity Tolerance: Patient tolerated treatment well Patient left: in chair;with call bell/phone within reach;with family/visitor present;Other (comment) (OT in  room) Nurse Communication: Mobility status PT Visit Diagnosis: Unsteadiness on feet (R26.81);History of falling (Z91.81);Other abnormalities of gait and mobility (R26.89)    Time: 5465-6812 PT Time Calculation (min) (ACUTE ONLY): 23 min   Charges:   PT Evaluation $PT Eval Moderate Complexity: 1 Mod PT Treatments $Gait Training: 8-22 mins        [employer], PT, [employer] 10/25/21,12:23 PM

## 2021-10-25 NOTE — Evaluation (Signed)
Occupational Therapy Evaluation Patient Details Name: Howard [name] MRN: [id number] DOB: [birth year] Today's Date: 10/25/2021   History of Present Illness Howard [name] is a 87 y.o. male with medical history significant of HFrEF, diabetes, gastric reflux, hypertension, hyperlipidemia, non-small cell lung cancer s/p radiation, chronic respiratory failure with emphysema, on 3 L of oxygen at home, CAD, presents to the emergency department with complaint of sudden onset shortness of breath.   Clinical Impression   Pt seen for OT evaluation this date in setting of acute hospitalization d/t SOB 2/2 HF exacerbation. Pt reports working with OPPT and reports use of AD for fxl mobility PRN, trying to improve balance with PT. Pt reports that he is able to perform most basic self care w/o assist or equipment, but endorses that he has had difficulty with IADLs recently that require more dynamic movement d/t SOB/balance. Pt requires CGA/SBA for ADL transfers and fxl mobility this date. On ADL assessment, pt requires INDEP to SETUP for seated UB ADLs, SETUP to MIN A for seated LB ADLs d/t some baseline limited knee ROM, h/o bilateral replacements. He also requires MIN A for standing dynamic reaching to simulate IADL tasks with RW. Will continue to follow acutely to improve energy conservation strategies, safety and INDEP for ADLs. Do not currently anticipate that pt will require f/u OT services outside of acute setting.     Recommendations for follow up therapy are one component of a multi-disciplinary discharge planning process, led by the attending physician.  Recommendations may be updated based on patient status, additional functional criteria and insurance authorization.   Follow Up Recommendations  No OT follow up    Assistance Recommended at Discharge Set up Supervision/Assistance  Functional Status Assessment  Patient has had a recent decline in their functional status and demonstrates the ability to make  significant improvements in function in a reasonable and predictable amount of time.  Equipment Recommendations  Tub/shower seat    Recommendations for Other Services       Precautions / Restrictions Precautions Precautions: Fall Restrictions Weight Bearing Restrictions: No      Mobility Bed Mobility               General bed mobility comments: up to chair with PT when OT presents    Transfers Overall transfer level: Needs assistance Equipment used: Rolling walker (2 wheels) Transfers: Sit to/from Stand Sit to Stand: Min guard;Supervision           General transfer comment: slightly impulsive, overall appropriate with cue following. Requires CGA/SBA for safety with STS. Some unsteadiness, but no gross LOB.      Balance Overall balance assessment: History of Falls;Needs assistance Sitting-balance support: Feet supported Sitting balance-Leahy Scale: Good     Standing balance support: Bilateral upper extremity supported;During functional activity Standing balance-Leahy Scale: Fair Standing balance comment: benefits from UE support, somewhat decreased safety awareness.                           ADL either performed or assessed with clinical judgement   ADL                                         General ADL Comments: INDEP to SETUP for seated UB ADLs, SETUP to MIN A for seated LB ADLs d/t some baseline limited knee ROM, h/o bilateral replacements. Pt  requires CGA/SBA for STS with RW.     Vision Baseline Vision/History: 1 Wears glasses Ability to See in Adequate Light: 0 Adequate Patient Visual Report: No change from baseline       Perception     Praxis      Pertinent Vitals/Pain Pain Assessment: No/denies pain     Hand Dominance Right   Extremity/Trunk Assessment Upper Extremity Assessment Upper Extremity Assessment: Overall WFL for tasks assessed (ROM WFL, MMT grossly 4-/5)   Lower Extremity Assessment Lower  Extremity Assessment: Overall WFL for tasks assessed   Cervical / Trunk Assessment Cervical / Trunk Assessment: Normal   Communication Communication Communication: HOH   Cognition Arousal/Alertness: Awake/alert Behavior During Therapy: WFL for tasks assessed/performed Overall Cognitive Status: Within Functional Limits for tasks assessed                                       General Comments       Exercises Other Exercises Other Exercises: OT ed with pt and spouse re: role in acute setting, EC strategies as they relate to dynamic balance requires for IADLs.   Shoulder Instructions      Home Living Family/patient expects to be discharged to:: Private residence Living Arrangements: Spouse/significant other Available Help at Discharge: Family;Available 24 hours/day Type of Home: House Home Access: Stairs to enter [employer] of Steps: 3 Entrance Stairs-Rails: Right;Left;Can reach both Home Layout: Laundry or work area in basement;One level     Bathroom Shower/Tub: Occupational psychologist: Handicapped height     Home Equipment: Rollator (4 wheels)          Prior Functioning/Environment Prior Level of Function : Independent/Modified Independent;History of Falls (last six months)             Mobility Comments: has not driven in last 6 mo, walks with AD intermittently, has been seeing OP PT to improve balance ([facility] [address]) ADLs Comments: Can perform all basic ADLs at baseline, some IADLs, but does endorse increased SOB with bending/lifting tasks.        OT Problem List: Decreased strength;Decreased activity tolerance;Decreased knowledge of use of DME or AE      OT Treatment/Interventions: Self-care/ADL training;Therapeutic exercise;Energy conservation;DME and/or AE instruction;Therapeutic activities;Patient/family education;Balance training    OT Goals(Current goals can be found in the care plan section) Acute  Rehab OT Goals Patient Stated Goal: to go home and start back at OPPT OT Goal Formulation: With patient/family Time For Goal Achievement: 11/08/21 Potential to Achieve Goals: Good ADL Goals Pt Will Transfer to Toilet: with modified independence;ambulating (with LRAD PRN to/from restroom with good pacing w/o verbal cues to initiate standing rest breaks when RR is noted to elevate.) Pt/caregiver will Perform Home Exercise Program: Increased strength;Both right and left upper extremity;With Supervision Additional ADL Goal #1: pt will verbalize/demo integration of 2-3 EC strategies into ADL routines with no cues.  OT Frequency: Min 2X/week   Barriers to D/C:            Co-evaluation              AM-PAC OT "6 Clicks" Daily Activity     Outcome Measure Help from another person eating meals?: None Help from another person taking care of personal grooming?: None Help from another person toileting, which includes using toliet, bedpan, or urinal?: A Little Help from another person bathing (including washing, rinsing, drying)?:  A Little Help from another person to put on and taking off regular upper body clothing?: A Little Help from another person to put on and taking off regular lower body clothing?: A Little 6 Click Score: 20   End of Session Equipment Utilized During Treatment: Gait belt;Rolling walker (2 wheels)  Activity Tolerance: Patient tolerated treatment well Patient left: in chair;with call bell/phone within reach;with chair alarm set;with family/visitor present  OT Visit Diagnosis: Unsteadiness on feet (R26.81)                Time: 1140-1157 OT Time Calculation (min): 17 min Charges:  OT General Charges $OT Visit: 1 Visit OT Evaluation $OT Eval Low Complexity: 1 Low OT Treatments $Self Care/Home Management : 8-22 mins  Gerrianne Scale, MS, OTR/L ascom [phone] 10/25/21, 7:11 PM

## 2021-10-26 DIAGNOSIS — I509 Heart failure, unspecified: Secondary | ICD-10-CM | POA: Diagnosis not present

## 2021-10-26 DIAGNOSIS — J189 Pneumonia, unspecified organism: Secondary | ICD-10-CM | POA: Diagnosis not present

## 2021-10-26 DIAGNOSIS — I420 Dilated cardiomyopathy: Secondary | ICD-10-CM

## 2021-10-26 DIAGNOSIS — J9601 Acute respiratory failure with hypoxia: Secondary | ICD-10-CM | POA: Diagnosis not present

## 2021-10-26 DIAGNOSIS — R0902 Hypoxemia: Secondary | ICD-10-CM | POA: Diagnosis not present

## 2021-10-26 LAB — APTT
aPTT: 60 seconds — ABNORMAL HIGH (ref 24–36)
aPTT: 71 seconds — ABNORMAL HIGH (ref 24–36)

## 2021-10-26 LAB — CBC
HCT: 31.8 % — ABNORMAL LOW (ref 39.0–52.0)
Hemoglobin: 10.2 g/dL — ABNORMAL LOW (ref 13.0–17.0)
MCH: 27.3 pg (ref 26.0–34.0)
MCHC: 32.1 g/dL (ref 30.0–36.0)
MCV: 85.3 fL (ref 80.0–100.0)
Platelets: 191 10*3/uL (ref 150–400)
RBC: 3.73 MIL/uL — ABNORMAL LOW (ref 4.22–5.81)
RDW: 18.3 % — ABNORMAL HIGH (ref 11.5–15.5)
WBC: 6.7 10*3/uL (ref 4.0–10.5)
nRBC: 0 % (ref 0.0–0.2)

## 2021-10-26 LAB — LACTIC ACID, PLASMA: Lactic Acid, Venous: 0.8 mmol/L (ref 0.5–1.9)

## 2021-10-26 LAB — BASIC METABOLIC PANEL
Anion gap: 11 (ref 5–15)
BUN: 30 mg/dL — ABNORMAL HIGH (ref 8–23)
CO2: 15 mmol/L — ABNORMAL LOW (ref 22–32)
Calcium: 9 mg/dL (ref 8.9–10.3)
Chloride: 111 mmol/L (ref 98–111)
Creatinine, Ser: 1.18 mg/dL (ref 0.61–1.24)
GFR, Estimated: 59 mL/min — ABNORMAL LOW (ref >60)
Glucose, Bld: 173 mg/dL — ABNORMAL HIGH (ref 70–99)
Potassium: 3.9 mmol/L (ref 3.5–5.1)
Sodium: 137 mmol/L (ref 135–145)

## 2021-10-26 LAB — PROCALCITONIN: Procalcitonin: <0.1 ng/mL

## 2021-10-26 LAB — LEGIONELLA PNEUMOPHILA SEROGP 1 UR AG: L. pneumophila Serogp 1 Ur Ag: NEGATIVE

## 2021-10-26 LAB — GLUCOSE, CAPILLARY
Glucose-Capillary: 178 mg/dL — ABNORMAL HIGH (ref 70–99)
Glucose-Capillary: 174 mg/dL — ABNORMAL HIGH (ref 70–99)
Glucose-Capillary: 232 mg/dL — ABNORMAL HIGH (ref 70–99)
Glucose-Capillary: 188 mg/dL — ABNORMAL HIGH (ref 70–99)

## 2021-10-26 LAB — HEPARIN LEVEL (UNFRACTIONATED): Heparin Unfractionated: 0.78 IU/mL — ABNORMAL HIGH (ref 0.30–0.70)

## 2021-10-26 MED ORDER — SERTRALINE HCL 50 MG PO TABS
50.0000 mg | ORAL_TABLET | Freq: Every day | ORAL | Status: DC
  Administered 2021-10-26 – 2021-10-27 (×2): 50 mg via ORAL
  Filled 2021-10-26 (×2): qty 1

## 2021-10-26 MED ORDER — SODIUM BICARBONATE 650 MG PO TABS
650.0000 mg | ORAL_TABLET | Freq: Three times a day (TID) | ORAL | Status: DC
  Administered 2021-10-26 – 2021-10-27 (×5): 650 mg via ORAL
  Filled 2021-10-26 (×6): qty 1

## 2021-10-26 NOTE — Progress Notes (Signed)
Progress Note  Patient Name: Howard [name] Date of Encounter: 10/26/2021  Primary Cardiologist: [name]  Subjective   No chest pain. Dyspnea a little worse at times today when compared to yesterday. Notes some congestion/dryness with supplemental oxygen. High sensitivity troponin trended to 12[address], now down trending. Echo as below.   Inpatient Medications    Scheduled Meds:  aspirin EC  81 mg Oral Daily   cholecalciferol  1,000 Units Oral Daily   dapagliflozin propanediol  10 mg Oral Daily   donepezil  5 mg Oral Daily   ezetimibe  10 mg Oral Daily   fluticasone furoate-vilanterol  1 puff Inhalation Daily   And   umeclidinium bromide  1 puff Inhalation Daily   furosemide  40 mg Intravenous Daily   insulin aspart  0-15 Units Subcutaneous TID WC   insulin aspart  0-5 Units Subcutaneous QHS   isosorbide mononitrate  30 mg Oral Daily   loratadine  10 mg Oral Daily   metoprolol succinate  25 mg Oral QHS   multivitamin with minerals  1 tablet Oral Daily   pantoprazole  40 mg Oral Daily   sacubitril-valsartan  1 tablet Oral BID   simvastatin  40 mg Oral QHS   sodium chloride flush  3 mL Intravenous Q12H   spironolactone  25 mg Oral Daily   Continuous Infusions:  sodium chloride     ceFEPime (MAXIPIME) IV 2 g (10/26/21 0905)   heparin 950 Units/hr (10/26/21 0855)   PRN Meds: sodium chloride, acetaminophen, montelukast, ondansetron (ZOFRAN) IV, sodium chloride flush   Vital Signs    Vitals:   10/25/21 2336 10/26/21 0121 10/26/21 0249 10/26/21 0733  BP: 139/71  (!) 110/94 129/74  Pulse: (!) 105  91 88  Resp: 18  18 18   Temp: 98.6 F (37 C)  98.1 F (36.7 C) 98.3 F (36.8 C)  TempSrc: Oral  Oral   SpO2: 95%  95% 98%  Weight:  82.1 kg      Intake/Output Summary (Last 24 hours) at 10/26/2021 1014 Last data filed at 10/26/2021 0600 Gross per 24 hour  Intake 823.22 ml  Output 100 ml  Net 723.22 ml    Filed Weights   10/24/21 0833 10/25/21 0350 10/26/21 0121   Weight: 88.9 kg 84 kg 82.1 kg    Telemetry    V paced - Personally Reviewed  ECG    No new tracings - Personally Reviewed  Physical Exam   GEN: No acute distress.   Neck: No JVD. Cardiac: RRR, I/VI systolic murmur LSB, no rubs, or gallops.  Respiratory: Diminished and coarse breath sounds along the right mid and lower right and left lung fields.On supplemental oxygen via nasal cannula at 5 L. GI: Soft, nontender, non-distended.   MS: No edema; No deformity. Neuro:  Alert and oriented x 3; Nonfocal.  Psych: Normal affect.  Labs    Chemistry Recent Labs  Lab 10/24/21 0827 10/25/21 0446 10/26/21 0434  NA 139 139 137  K 4.1 3.9 3.9  CL 110 111 111  CO2 18* 20* 15*  GLUCOSE 241* 160* 173*  BUN 25* 25* 30*  CREATININE 1.27* 1.11 1.18  CALCIUM 9.1 9.0 9.0  PROT 7.2  --   --   ALBUMIN 3.8  --   --   AST 21  --   --   ALT 15  --   --   ALKPHOS 49  --   --   BILITOT 0.8  --   --  GFRNONAA 54* >60 59*  ANIONGAP 11 8 11       Hematology Recent Labs  Lab 10/24/21 0827 10/25/21 0902 10/26/21 0434  WBC 9.7 7.2 6.7  RBC 4.60 3.97* 3.73*  HGB 12.6* 11.1* 10.2*  HCT 39.3 33.2* 31.8*  MCV 85.4 83.6 85.3  MCH 27.4 28.0 27.3  MCHC 32.1 33.4 32.1  RDW 18.7* 18.4* 18.3*  PLT 282 208 191     Cardiac EnzymesNo results for input(s): TROPONINI in the last 168 hours. No results for input(s): TROPIPOC in the last 168 hours.   BNP Recent Labs  Lab 10/24/21 0827  BNP 4,227.2*      DDimer No results for input(s): DDIMER in the last 168 hours.   Radiology    CT Angio Chest PE W and/or Wo Contrast  Result Date: 10/24/2021 IMPRESSION: 1. No evidence of pulmonary embolism. 2. New patchy ground-glass densities with areas of nodular consolidation in the right upper, right middle, and right lower lobes, with inter- and intralobular septal thickening. Differential considerations include multifocal pneumonia, asymmetric pulmonary edema, and pulmonary hemorrhage. 3. New  small right and trace left pleural effusions. 4. New 9 x 8 mm spiculated density in the right upper lobe, presumably related to underlying acute process in the right lung. However, attention on follow-up imaging is recommended. 5. Aortic Atherosclerosis (ICD10-I70.0) and Emphysema (ICD10-J43.9). Electronically Signed   By: Titus Dubin M.D.   On: 10/24/2021 11:14   DG Chest Portable 1 View  Result Date: 10/24/2021 IMPRESSION: 1. New hazy airspace disease in the right mid and lower lung concerning for pneumonia. Electronically Signed   By: Titus Dubin M.D.   On: 10/24/2021 08:54    Cardiac Studies   2D echo 10/25/2021: Pending __________  2D echo 07/24/2021:  1. Left ventricular ejection fraction, by estimation, is 20 to 25%. The  left ventricle has severely decreased function. The left ventricle  demonstrates global hypokinesis. There is moderate left ventricular  hypertrophy. Left ventricular diastolic  parameters are indeterminate.   2. Right ventricular systolic function is normal. The right ventricular  size is normal. Tricuspid regurgitation signal is inadequate for assessing  PA pressure.   3. Left atrial size was mildly dilated.   4. The mitral valve is degenerative. Mild mitral valve regurgitation.   5. The aortic valve is tricuspid. There is moderate calcification of the  aortic valve. There is moderate thickening of the aortic valve. Aortic  valve regurgitation is not visualized. Mild to moderate aortic valve  sclerosis/calcification is present,  without any evidence of aortic stenosis. __________  [facility] 04/13/2021: Prox RCA lesion is 30% stenosed. Dist RCA lesion is 50% stenosed. Prox LAD to Mid LAD lesion is 20% stenosed. Ost Cx to Prox Cx lesion is 30% stenosed. 1st Mrg lesion is 40% stenosed. 2nd Diag lesion is 60% stenosed.   1.  Mild to moderate three-vessel coronary artery disease with severe calcifications.  No obstructive lesions. 2.  Left ventricular  angiography was not performed.  EF was severely reduced by echo. 3.  Right heart catheterization showed minimally elevated filling pressures, mild pulmonary hypertension and normal cardiac output.   Recommendations: The patient has nonischemic cardiomyopathy. Continue aggressive medical therapy. Volume status appears good. __________  2D echo 04/05/2021: 1. Left ventricular ejection fraction, by estimation, is 20 to 25%. The  left ventricle has severely decreased function. The left ventricle  demonstrates global hypokinesis. There is mild asymmetric left ventricular  hypertrophy. Left ventricular diastolic   parameters are consistent with  Grade II diastolic dysfunction  (pseudonormalization). The average left ventricular global longitudinal  strain is -4.6 %. The global longitudinal strain is abnormal.   2. Right ventricular systolic function is moderately reduced. The right  ventricular size is normal.   3. The mitral valve is degenerative. Mild mitral valve regurgitation.   4. The aortic valve is tricuspid. Aortic valve regurgitation is not  visualized. Mild to moderate aortic valve sclerosis/calcification is  present, without any evidence of aortic stenosis.   5. The inferior vena cava is dilated in size with <50% respiratory  variability, suggesting right atrial pressure of 15 mmHg.  Patient Profile     87 y.o. male with history of nonobstructive CAD by LHC in 03/2021, HFrEF secondary to NICM, complete heart block status post PPM, lung cancer s/p radiation, chronic hypoxic respiratory failure on supplemental oxygen at 3 L at baseline, splenic infarct on chronic OAC, AAA s/p engovascular grafting, PAD s/p multiple interventions, DM2, HTN, HLD, Bell's palsy, prostate cancer, vertigo, and OSA on CPAP who we are seeing for NSTEMI in the context of PNA.   Assessment & Plan    1. Multivessel nonobstructive CAD with NSTEMI: -No chest pain -Likely in the setting of his PNA with acute on  chronic hypoxic respiratory failure requiring BiPAP with a degree of volume overload  -With significantly elevated troponin, Eliquis was held on 11/3, and he was started on heparin gtt with recommendation to treat for 48 hours (completes in the morning of 11/5) -ASA, Toprol, Zetia, simvastatin  -Consider repeat cardiac cath once his acute illness is improved with timing to be determined at this time, though his EF appears similar to prior studies dating back to the spring of 2022 -If he develops unstable angina or decompensates, may need to consider cath sooner, for now, he is stable  2. HFrEF secondary to NICM: -Appears grossly euvolemic -Maintain net even to slightly negative fluid balance  -EF appears to be similar to his baseline dating back to the spring of 2020 (last normal documented EF in 2020) -Continue IV Lasix for today, possibly transition to oral Lasix over the next 24-48 hours -PTA Toprol XL, Entresto, and spironolactone -Echo as above -Daily weights -Strict I/O  3. Complete heart block: -Status post PPM -Device appears to be functioning normally on telemetry  -Followed by EP  4. Acute on chronic hypoxic respiratory failure secondary to PNA: -Weaned off BiPAP, now back on nasal cannula at 5 L -Management per primary service and pulmonology   5. History of splenic infarct: -Hold PTA Eliquis -Heparin gtt for 48 hours as above -Resume PTA Eliquis prior to discharge once it is clear no inpatient invasive procedure will be needed  6. HTN: -Blood pressure well controlled -Continue medications as outlined above  7. HLD: -LDL 66 -Zetia and simvastatin    For questions or updates, please contact [facility] Please consult [email or web address] for contact info under Cardiology/STEMI.    Signed, Christell Faith, PA-C [facility] Pager: [phone] 10/26/2021, 10:14 AM

## 2021-10-26 NOTE — Clinical Social Work Note (Signed)
Occupational Therapy * Physical Therapy * Speech Therapy                      DATE __11/4/2022___________________________  PATIENT NAME______[name]_________________  PATIENT MRN______[id number]______________  DIAGNOSIS/DIAGNOSIS CODE ___Acute on chronic HFrEF (heart failure with reduced ejection fraction) (HCC)/_I50.23_______________   DATE OF DISCHARGE: ___11/4/2022___________  PRIMARY CARE PHYSICIAN__Richard Rosanna Randy, MD_________     PCP PHONE/FAX_____[phone]_________________    Dear Provider (Name: _______________________________   Fax: ___________________________):   I certify that I have examined this patient and that occupational/physical/speech therapy is necessary on an outpatient basis.    The patient has expressed interest in completing their recommended course of therapy at your  location.  Once a formal order from the patient's primary care physician has been obtained, please contact him/her to schedule an appointment for evaluation at your earliest convenience.   [  X]  Physical Therapy Evaluate and Treat          [  ]  Occupational Therapy Evaluate and Treat                       [  ]  Speech Therapy Evaluate and Treat    The patient's primary care physician (listed above) must furnish and be responsible for a formal order such that the recommended services may be furnished while under the primary physician's care, and that the plan of care will be established and reviewed every 30 days (or more often if condition necessitates).   MD electronic signature noted below

## 2021-10-26 NOTE — Progress Notes (Signed)
Pulmonology Progress Note         Date: 10/26/2021,   MRN# [id number] Howard [name] [birth year]     AdmissionWeight: 88.9 kg                 CurrentWeight: 82.1 kg   Referring physician: Dr Reesa Chew   CHIEF COMPLAINT:   Non-massive hemoptysis   HISTORY OF PRESENT ILLNESS   This is a pleasant 88 yo m with hx of CHF, DM, GERD, HTN, NCLC s/p SBRT, chronic hypoxemia on 3L O2, cAd who came in to ED with acute onset SOB. This happenend at home while at rest suddenly.  He has OSA and was placed on CPAP. He had CTPE done in hospital negative for PE.  His bloodwork showed severely elevated BNP. CT also showed new 49mm RUL nodule. He is being treated for pneumonia empirically and has PCCM consult for abnormal ct chest findings and non - massive hemoptysis.   10/25/21- patient improved hes on 5L/[place].  He is starting PT/OT.  His cough is better. Workup is in process. [facility] cardiology consultation NSTEMI workup in progress.   10/26/21- Patient feels slightly improved, he has gotten out of bed across the room but has not had activity thus far. He has PT ordered. He had CXR with improvement on left chest. His O2 has improved with weaning supplemental oxygent to 4L/min [place].  Overall there is little evidence for bacterial pneumonia but I think we should finish empiric antibiotics for total 5 days and continue cardiac intensive medical management.    PAST MEDICAL HISTORY   Past Medical History:  Diagnosis Date   Arthritis    Bell palsy    Bell's palsy 04/12/2015   Cancer [facility])    prostate and skin   Chronic combined systolic and diastolic CHF, NYHA class 1 ([place])    a. 07/2014 Echo: EF 35-40%, Gr 1 DD.   Complete heart block ([place])    a. 11/2010 s/p SJM 2210 [employer] DC PPM, ser# [id number].   Depression    Diabetes mellitus without complication ([place])    Fall 11-10-14   GERD (gastroesophageal reflux disease)    History of prostate cancer    Hyperlipidemia    Hypertension    LBBB (left  bundle branch block)    Left-sided Bell's palsy    Lung cancer ([place]) 2016   NICM (nonischemic cardiomyopathy) ([place])    a. 07/2014 Echo: EF 35-40%, mid-apicalanteroseptal DK, Gr 1 DD, mild-mod dil LA.   Non-obstructive CAD    a. 07/2014 Abnl MV;  b. 08/2014 Cath: LM nl, LAD 30p, RI 40p, LCX nl, OM1 40, RCA dominant 30p, 70d-->Med Rx.   Poor balance    Presence of permanent cardiac pacemaker    Sleep apnea    a. cpap   Vertigo    WPW (Wolff-Parkinson-White syndrome)    a. S/P RFCA 1991.     SURGICAL HISTORY   Past Surgical History:  Procedure Laterality Date   ABDOMINAL AORTIC ENDOVASCULAR STENT GRAFT  08/25/2019   Procedure: ABDOMINAL AORTIC ENDOVASCULAR STENT GRAFT;  Surgeon: Algernon Huxley, MD;  Location: [facility] ORS;  Service: Vascular;;   ANGIOPLASTY Left 08/25/2019   Procedure: ANGIOPLASTY;  Surgeon: Algernon Huxley, MD;  Location: [facility] ORS;  Service: Vascular;  Laterality: Left;  left SFA and stent placement   APPLICATION OF WOUND VAC Left 06/07/2015   Procedure: APPLICATION OF WOUND VAC;  Surgeon: Robert Bellow, MD;  Location: [facility] ORS;  Service: General;  Laterality: Left;  left upper back   BACK SURGERY     2011   CARDIAC CATHETERIZATION  08/26/2014   Single vessel obstructive CAD   CARPAL TUNNEL RELEASE  04-04-15   [facility]   CATARACT EXTRACTION  07-31-11 and 09-18-11   Catheter ablation  1991   for WPW   cervical fusion     CHOLECYSTECTOMY  09-07-14   ENDARTERECTOMY FEMORAL Left 08/25/2019   Procedure: ENDARTERECTOMY FEMORAL;  Surgeon: Algernon Huxley, MD;  Location: [facility] ORS;  Service: Vascular;  Laterality: Left;  common and produndis    ENDOVASCULAR REPAIR/STENT GRAFT Right 08/25/2019   Procedure: ENDOVASCULAR REPAIR/STENT GRAFT;  Surgeon: Algernon Huxley, MD;  Location: [facility] ORS;  Service: Vascular;  Laterality: Right;  renal artery   HAND SURGERY     right 1993; left 2005   [place] / REPLACE / REMOVE PACEMAKER     INSERTION OF ILIAC STENT Bilateral 08/25/2019    Procedure: INSERTION OF ILIAC STENT;  Surgeon: Algernon Huxley, MD;  Location: [facility] ORS;  Service: Vascular;  Laterality: Bilateral;   JOINT REPLACEMENT Left 2013   knee   JOINT REPLACEMENT Right 2004   knee   KNEE SURGERY     left knee 1991 and 1992; right knee [place] N/A 08/26/2014   Procedure: LEFT HEART CATHETERIZATION WITH CORONARY ANGIOGRAM;  Surgeon: [name], MD;  [facility];  Service: Cardiovascular;  Laterality: N/A;   LOWER EXTREMITY ANGIOGRAPHY Left 08/23/2019   Procedure: Lower Extremity Angiography;  Surgeon: Algernon Huxley, MD;  Location: [place] CV LAB;  Service: Cardiovascular;  Laterality: Left;   LUMBAR LAMINECTOMY/DECOMPRESSION MICRODISCECTOMY N/A 06/07/2014   Procedure: LUMBAR FOUR TO FIVE LUMBAR LAMINECTOMY/DECOMPRESSION MICRODISCECTOMY 1 LEVEL;  Surgeon: [name], MD;  Location: [place] NEURO ORS;  Service: Neurosurgery;  Laterality: N/A;   LUNG BIOPSY Right 2016   Dr [name]   MOHS SURGERY     PACEMAKER INSERTION     PPM-- [employer] [facility] 11/30/10 by [name]   PPM GENERATOR CHANGEOUT N/A 07/09/2019   Procedure: PPM GENERATOR CHANGEOUT;  Surgeon: Deboraha Sprang, MD;  Location: [place] CV LAB;  Service: Cardiovascular;  Laterality: N/A;   PROSTATE SURGERY     cancer--1998, prostatectomy   REPLACEMENT TOTAL KNEE     2004   RIGHT/LEFT HEART CATH AND CORONARY ANGIOGRAPHY Bilateral 04/13/2021   Procedure: RIGHT/LEFT HEART CATH AND CORONARY ANGIOGRAPHY;  Surgeon: Wellington Hampshire, MD;  Location: [place] CV LAB;  Service: Cardiovascular;  Laterality: Bilateral;   ruptured disc     1962 and 1998   TEE WITHOUT CARDIOVERSION N/A 09/01/2019   Procedure: TRANSESOPHAGEAL ECHOCARDIOGRAM (TEE);  Surgeon: [name], MD;  Location: [facility] ORS;  Service: Cardiovascular;  Laterality: N/A;   TEMPORARY PACEMAKER N/A 07/09/2019   Procedure: TEMPORARY PACEMAKER;  Surgeon: Deboraha Sprang, MD;  Location: [place] CV LAB;   Service: Cardiovascular;  Laterality: N/A;   TRIGGER FINGER RELEASE  01-24-15   WOUND DEBRIDEMENT Left 06/07/2015   Procedure: DEBRIDEMENT WOUND;  Surgeon: Robert Bellow, MD;  Location: [facility] ORS;  Service: General;  Laterality: Left;  left upper back     FAMILY HISTORY   Family History  Problem Relation Age of Onset   Heart attack Mother    Hyperlipidemia Mother    CAD Other    Prostate cancer Neg Hx    Cancer Neg Hx  SOCIAL HISTORY   Social History   Tobacco Use   Smoking status: Former    Packs/day: 1.00    Years: 56.00    Pack years: 56.00    Types: Cigarettes    Quit date: 2011    Years since quitting: 11.8   Smokeless tobacco: Never  Vaping Use   Vaping Use: Never used  Substance Use Topics   Alcohol use: No   Drug use: No     MEDICATIONS    Home Medication:  REM   Current Medication:  Current Facility-Administered Medications:    0.9 %  sodium chloride infusion, 250 mL, Intravenous, PRN, Lorella Nimrod, MD   acetaminophen (TYLENOL) tablet 650 mg, 650 mg, Oral, Q4H PRN, Lorella Nimrod, MD   aspirin EC tablet 81 mg, 81 mg, Oral, Daily, Amin, Soundra Pilon, MD, 81 mg at 10/26/21 0851   ceFEPIme (MAXIPIME) 2 g in sodium chloride 0.9 % 100 mL IVPB, 2 g, Intravenous, Q12H, [name], RPH, Last Rate: 200 mL/hr at 10/26/21 0905, 2 g at 10/26/21 1610   cholecalciferol (VITAMIN D3) tablet 1,000 Units, 1,000 Units, Oral, Daily, Lorella Nimrod, MD, 1,000 Units at 10/26/21 9604   dapagliflozin propanediol (FARXIGA) tablet 10 mg, 10 mg, Oral, Daily, Amin, Soundra Pilon, MD   donepezil (ARICEPT) tablet 5 mg, 5 mg, Oral, Daily, Amin, Soundra Pilon, MD, 5 mg at 10/26/21 0852   ezetimibe (ZETIA) tablet 10 mg, 10 mg, Oral, Daily, Amin, Sumayya, MD, 10 mg at 10/26/21 0852   fluticasone furoate-vilanterol (BREO ELLIPTA) 100-25 MCG/ACT 1 puff, 1 puff, Inhalation, Daily, 1 puff at 10/26/21 0851 **AND** umeclidinium bromide (INCRUSE ELLIPTA) 62.5 MCG/ACT 1 puff, 1 puff, Inhalation,  Daily, Amin, Soundra Pilon, MD, 1 puff at 10/26/21 0851   furosemide (LASIX) injection 40 mg, 40 mg, Intravenous, Daily, Amin, Sumayya, MD, 40 mg at 10/26/21 0851   heparin ADULT infusion 100 units/mL (25000 units/262mL), 950 Units/hr, Intravenous, Continuous, Belue, Alver Sorrow, RPH, Last Rate: 9.5 mL/hr at 10/26/21 0855, 950 Units/hr at 10/26/21 0855   insulin aspart (novoLOG) injection 0-15 Units, 0-15 Units, Subcutaneous, TID WC, Amin, Soundra Pilon, MD, 3 Units at 10/26/21 0859   insulin aspart (novoLOG) injection 0-5 Units, 0-5 Units, Subcutaneous, QHS, Amin, Soundra Pilon, MD   isosorbide mononitrate (IMDUR) 24 hr tablet 30 mg, 30 mg, Oral, Daily, Amin, Soundra Pilon, MD, 30 mg at 10/26/21 0852   loratadine (CLARITIN) tablet 10 mg, 10 mg, Oral, Daily, Amin, Soundra Pilon, MD, 10 mg at 10/26/21 5409   metoprolol succinate (TOPROL-XL) 24 hr tablet 25 mg, 25 mg, Oral, QHS, Amin, Sumayya, MD, 25 mg at 10/25/21 2108   montelukast (SINGULAIR) tablet 10 mg, 10 mg, Oral, Daily PRN, Lorella Nimrod, MD   multivitamin with minerals tablet 1 tablet, 1 tablet, Oral, Daily, Lorella Nimrod, MD, 1 tablet at 10/26/21 0852   ondansetron (ZOFRAN) injection 4 mg, 4 mg, Intravenous, Q6H PRN, Lorella Nimrod, MD   pantoprazole (PROTONIX) EC tablet 40 mg, 40 mg, Oral, Daily, Amin, Soundra Pilon, MD, 40 mg at 10/26/21 0851   sacubitril-valsartan (ENTRESTO) 49-51 mg per tablet, 1 tablet, Oral, BID, Lorella Nimrod, MD, 1 tablet at 10/26/21 0851   simvastatin (ZOCOR) tablet 40 mg, 40 mg, Oral, QHS, Amin, Sumayya, MD, 40 mg at 10/25/21 2108   sodium chloride flush (NS) 0.9 % injection 3 mL, 3 mL, Intravenous, Q12H, Amin, Sumayya, MD, 3 mL at 10/26/21 0857   sodium chloride flush (NS) 0.9 % injection 3 mL, 3 mL, Intravenous, PRN, Lorella Nimrod, MD   spironolactone (ALDACTONE) tablet 25 mg, 25  mg, Oral, Daily, Lorella Nimrod, MD, 25 mg at 10/26/21 5093    ALLERGIES   Sulfa antibiotics     REVIEW OF SYSTEMS    Review of Systems:  Gen:  Denies  fever,  sweats, chills weigh loss  HEENT: Denies blurred vision, double vision, ear pain, eye pain, hearing loss, nose bleeds, sore throat Cardiac:  No dizziness, chest pain or heaviness, chest tightness,edema Resp:   Denies cough or sputum porduction, shortness of breath,wheezing, hemoptysis,  Gi: Denies swallowing difficulty, stomach pain, nausea or vomiting, diarrhea, constipation, bowel incontinence Gu:  Denies bladder incontinence, burning urine Ext:   Denies Joint pain, stiffness or swelling Skin: Denies  skin rash, easy bruising or bleeding or hives Endoc:  Denies polyuria, polydipsia , polyphagia or weight change Psych:   Denies depression, insomnia or hallucinations   Other:  All other systems negative   VS: BP 129/74 (BP Location: Right Arm)   Pulse 88   Temp 98.3 F (36.8 C)   Resp 18   Wt 82.1 kg   SpO2 98%   BMI 26.74 kg/m      PHYSICAL EXAM    GENERAL:NAD, no fevers, chills, no weakness no fatigue HEAD: Normocephalic, atraumatic.  EYES: Pupils equal, round, reactive to light. Extraocular muscles intact. No scleral icterus.  MOUTH: Moist mucosal membrane. Dentition intact. No abscess noted.  EAR, NOSE, THROAT: Clear without exudates. No external lesions.  NECK: Supple. No thyromegaly. No nodules. No JVD.  PULMONARY: Diffuse coarse rhonchi right sided +wheezes CARDIOVASCULAR: S1 and S2. Regular rate and rhythm. No murmurs, rubs, or gallops. No edema. Pedal pulses 2+ bilaterally.  GASTROINTESTINAL: Soft, nontender, nondistended. No masses. Positive bowel sounds. No hepatosplenomegaly.  MUSCULOSKELETAL: No swelling, clubbing, or edema. Range of motion full in all extremities.  NEUROLOGIC: Cranial nerves II through XII are intact. No gross focal neurological deficits. Sensation intact. Reflexes intact.  SKIN: No ulceration, lesions, rashes, or cyanosis. Skin warm and dry. Turgor intact.  PSYCHIATRIC: Mood, affect within normal limits. The patient is awake, alert and oriented  x 3. Insight, judgment intact.       IMAGING    CT Angio Chest PE W and/or Wo Contrast  Result Date: 10/24/2021 CLINICAL DATA:  Acute onset shortness of breath. History of lung cancer. EXAM: CT ANGIOGRAPHY CHEST WITH CONTRAST TECHNIQUE: Multidetector CT imaging of the chest was performed using the standard protocol during bolus administration of intravenous contrast. Multiplanar CT image reconstructions and MIPs were obtained to evaluate the vascular anatomy. CONTRAST:  6mL OMNIPAQUE IOHEXOL 350 MG/ML SOLN COMPARISON:  CT chest dated September 12, 2021. FINDINGS: Cardiovascular: Satisfactory opacification of the pulmonary arteries to the segmental level. No evidence of pulmonary embolism. Unchanged mild cardiomegaly. No pericardial effusion. No thoracic aortic aneurysm. Coronary, aortic arch, and branch vessel atherosclerotic vascular disease. Mediastinum/Nodes: No enlarged mediastinal, hilar, or axillary lymph nodes. Unchanged index subcarinal lymph node measuring 1.1 cm in short axis. Thyroid gland, trachea, and esophagus demonstrate no significant findings. Lungs/Pleura: New patchy ground-glass densities with areas of nodular consolidation in the right upper, right middle, and right lower lobes, with inter- and intralobular septal thickening. Post treatment related dense fibrotic consolidation bronchiectasis in the medial right upper and lower lobes has progressed. New small right and trace left pleural effusions. No pneumothorax. Unchanged calcified scarring in the lingula and medial right middle lobe. Mild dependent subsegmental atelectasis in the posterior left lower lobe. Mild emphysema. New 9 x 8 mm spiculated density in the right upper lobe (series 6, image  22). Upper Abdomen: No acute abnormality. Musculoskeletal: No chest wall abnormality. No acute or significant osseous findings. Review of the MIP images confirms the above findings. IMPRESSION: 1. No evidence of pulmonary embolism. 2. New  patchy ground-glass densities with areas of nodular consolidation in the right upper, right middle, and right lower lobes, with inter- and intralobular septal thickening. Differential considerations include multifocal pneumonia, asymmetric pulmonary edema, and pulmonary hemorrhage. 3. New small right and trace left pleural effusions. 4. New 9 x 8 mm spiculated density in the right upper lobe, presumably related to underlying acute process in the right lung. However, attention on follow-up imaging is recommended. 5. Aortic Atherosclerosis (ICD10-I70.0) and Emphysema (ICD10-J43.9). Electronically Signed   By: Titus Dubin M.D.   On: 10/24/2021 11:14   DG Chest Port 1 View  Result Date: 10/25/2021 CLINICAL DATA:  87 year old male with history of shortness of breath and lung cancer with recent abnormal imaging. EXAM: PORTABLE CHEST 1 VIEW COMPARISON:  Chest CT of October 24, 2021 and chest radiograph of October 24, 2021. FINDINGS: LEFT-sided dual lead pacer device, leads project over the cardiac silhouette and power pack over the LEFT chest as before. EKG leads project over the chest. Cardiomediastinal contours are stable. Increased interstitial and alveolar opacities seen more on the RIGHT than the LEFT have diminished in the short interval. Bandlike area of airspace disease in the RIGHT mid chest. No pneumothorax. On limited assessment there is no acute skeletal process. IMPRESSION: Increased interstitial and alveolar opacities seen on the RIGHT than the LEFT have diminished in the short interval. Likely reflecting improving edema and or volume overload. Bandlike area of airspace disease in the RIGHT mid chest likely atelectatic changes. Persistent basilar opacities in the RIGHT medial lower chest related to volume loss and post treatment changes, superimposed pneumonia would be difficult to exclude. Electronically Signed   By: Zetta Bills M.D.   On: 10/25/2021 11:32   DG Chest Portable 1 View  Result  Date: 10/24/2021 CLINICAL DATA:  Shortness of breath. EXAM: PORTABLE CHEST 1 VIEW COMPARISON:  CT chest dated September 12, 2021. Chest x-ray dated July 03, 2021. FINDINGS: Left chest wall pacemaker again noted. Unchanged mild cardiomegaly. New hazy airspace disease in the right mid and lower lung. Unchanged scarring in the left lung base and post treatment changes in the medial right lower lobe. No pleural effusion or pneumothorax. No acute osseous abnormality. IMPRESSION: 1. New hazy airspace disease in the right mid and lower lung concerning for pneumonia. Electronically Signed   By: Titus Dubin M.D.   On: 10/24/2021 08:54   ECHOCARDIOGRAM COMPLETE  Result Date: 10/25/2021    ECHOCARDIOGRAM REPORT   Patient Name:   Howard [name] Date of Exam: 10/25/2021 Medical Rec #:  [id number]      Height:       69.0 in Accession #:    [id number]     Weight:       185.2 lb Date of Birth:  [birth year]       BSA:          2.000 m Patient Age:    7 years       BP:           123/69 mmHg Patient Gender: M              HR:           106 bpm. Exam Location:  [facility] Procedure: 2D Echo, Color Doppler and Cardiac Doppler Indications:  Elevated troponin  History:         Patient has prior history of Echocardiogram examinations, most                  recent 07/24/2021. CHF, CAD, Pacemaker; Risk Factors:Diabetes,                  Hypertension and Dyslipidemia. Hx of radiation therapy.  Sonographer:     Charmayne Sheer Referring Phys:  [id number] SHFWYOV AMIN Diagnosing Phys: [name] MD IMPRESSIONS  1. Left ventricular ejection fraction, by estimation, is 20 to 25%. The left ventricle has severely decreased function. The left ventricle demonstrates global hypokinesis. There is mild left ventricular hypertrophy. Left ventricular diastolic parameters  are consistent with Grade II diastolic dysfunction (pseudonormalization).  2. Right ventricular systolic function is normal. The right ventricular size is normal. Tricuspid regurgitation  signal is inadequate for assessing PA pressure.  3. Left atrial size was moderately dilated.  4. The mitral valve is normal in structure. Mild to moderate mitral valve regurgitation. No evidence of mitral stenosis.  5. The aortic valve was not well visualized. Aortic valve regurgitation is not visualized. Mild to moderate aortic valve sclerosis/calcification is present, without any evidence of aortic stenosis.  6. There is borderline dilatation of the aortic root, measuring 37 mm.  7. The inferior vena cava is normal in size with greater than 50% respiratory variability, suggesting right atrial pressure of 3 mmHg. FINDINGS  Left Ventricle: Left ventricular ejection fraction, by estimation, is 20 to 25%. The left ventricle has severely decreased function. The left ventricle demonstrates global hypokinesis. The left ventricular internal cavity size was normal in size. There is mild left ventricular hypertrophy. Left ventricular diastolic parameters are consistent with Grade II diastolic dysfunction (pseudonormalization). Right Ventricle: The right ventricular size is normal. No increase in right ventricular wall thickness. Right ventricular systolic function is normal. Tricuspid regurgitation signal is inadequate for assessing PA pressure. Left Atrium: Left atrial size was moderately dilated. Right Atrium: Right atrial size was normal in size. Pericardium: There is no evidence of pericardial effusion. Mitral Valve: The mitral valve is normal in structure. There is mild thickening of the mitral valve leaflet(s). Mild to moderate mitral valve regurgitation. No evidence of mitral valve stenosis. MV peak gradient, 11.8 mmHg. The mean mitral valve gradient  is 4.0 mmHg. Tricuspid Valve: The tricuspid valve is normal in structure. Tricuspid valve regurgitation is not demonstrated. No evidence of tricuspid stenosis. Aortic Valve: The aortic valve was not well visualized. Aortic valve regurgitation is not visualized. Mild to  moderate aortic valve sclerosis/calcification is present, without any evidence of aortic stenosis. Aortic valve mean gradient measures 2.0 mmHg.  Aortic valve peak gradient measures 4.8 mmHg. Aortic valve area, by VTI measures 2.19 cm. Pulmonic Valve: The pulmonic valve was normal in structure. Pulmonic valve regurgitation is not visualized. No evidence of pulmonic stenosis. Aorta: The aortic root is normal in size and structure. There is borderline dilatation of the aortic root, measuring 37 mm. Venous: The inferior vena cava is normal in size with greater than 50% respiratory variability, suggesting right atrial pressure of 3 mmHg. IAS/Shunts: No atrial level shunt detected by color flow Doppler. Additional Comments: A device lead is visualized.  LEFT VENTRICLE PLAX 2D LVIDd:         4.70 cm   Diastology LVIDs:         3.46 cm   LV e' medial:    7.29 cm/s LV PW:  1.09 cm   LV E/e' medial:  20.3 LV IVS:        0.89 cm   LV e' lateral:   12.40 cm/s LVOT diam:     2.40 cm   LV E/e' lateral: 11.9 LV SV:         32 LV SV Index:   16 LVOT Area:     4.52 cm  LEFT ATRIUM             Index LA diam:        5.50 cm 2.75 cm/m LA Vol (A2C):   66.1 ml 33.06 ml/m LA Vol (A4C):   41.0 ml 20.50 ml/m LA Biplane Vol: 52.1 ml 26.06 ml/m  AORTIC VALVE                    PULMONIC VALVE AV Area (Vmax):    2.14 cm     PV Vmax:       0.67 m/s AV Area (Vmean):   2.22 cm     PV Vmean:      49.300 cm/s AV Area (VTI):     2.19 cm     PV VTI:        0.094 m AV Vmax:           110.00 cm/s  PV Peak grad:  1.8 mmHg AV Vmean:          65.100 cm/s  PV Mean grad:  1.0 mmHg AV VTI:            0.144 m AV Peak Grad:      4.8 mmHg AV Mean Grad:      2.0 mmHg LVOT Vmax:         52.00 cm/s LVOT Vmean:        31.900 cm/s LVOT VTI:          0.070 m LVOT/AV VTI ratio: 0.48  AORTA Ao Root diam: 3.70 cm MITRAL VALVE MV Area (PHT): 5.58 cm     SHUNTS MV Area VTI:   1.06 cm     Systemic VTI:  0.07 m MV Peak grad:  11.8 mmHg    Systemic Diam:  2.40 cm MV Mean grad:  4.0 mmHg MV Vmax:       1.72 m/s MV Vmean:      89.6 cm/s MV Decel Time: 136 msec MV E velocity: 148.00 cm/s MV A velocity: 51.40 cm/s MV E/A ratio:  2.88 [name] MD Electronically signed by [name] MD Signature Date/Time: 10/25/2021/5:46:45 PM    Final    CUP PACEART REMOTE DEVICE CHECK  Result Date: 10/09/2021 Scheduled remote reviewed. Normal device function.  14 AMS, <10sec PAT 1 PMT appropriately treated without termination Next remote 91 days. LR     ASSESSMENT/PLAN   Acute hypoxemic respiratory failure - present on admission  - COVID19 negative -there is acute on chornic CHF but infiltrate is almost exclusively on right lung which suggests pneumonia as primary inciting event.   -Procalcitonin is normal and there is no leukocytosis or fevers to suggest active infection - supplemental O2 during my evaluation BIPAP 100% - infectious workup for pneumonia as below: -Respiratory viral panel -serum fungitell -legionella ab -strep pneumoniae ur AG -Histoplasma Ur Ag -sputum resp cultures -AFB sputum expectorated specimen -sputum cytology  -reviewed pertinent imaging with patient today - CRP -PT/OT for d/c planning  -please encourage patient to use incentive spirometer few times each hour while hospitalized.   -ABG -appreciate cardiology consultation  input    NSTEMI  - medical management at this time  - cardiology on case appreciate input   - no contraindications to anticoagulation in context of hemoptysis from pulmonary perspective. OK to have antiplatelet agents.    Thank you for allowing me to participate in the care of this patient.    Patient/Family are satisfied with care plan and all questions have been answered.  This document was prepared using Dragon voice recognition software and may include unintentional dictation errors.     Ottie Glazier, M.D.  Division of [place]

## 2021-10-26 NOTE — Consult Note (Signed)
ANTICOAGULATION CONSULT NOTE  Pharmacy Consult for Heparin Indication: chest pain/ACS  Patient Measurements: Weight: 82.1 kg (181 lb 1.6 oz) Heparin Dosing Weight: 84 kg  Labs: Recent Labs    10/24/21 0827 10/24/21 1036 10/24/21 1728 10/25/21 0446 10/25/21 0902 10/25/21 1033 10/25/21 1822 10/26/21 0434  HGB 12.6*  --   --   --  11.1*  --   --  10.2*  HCT 39.3  --   --   --  33.2*  --   --  31.8*  PLT 282  --   --   --  208  --   --  191  APTT  --   --   --   --  42*  --  142* 60*  LABPROT  --   --   --   --  17.4*  --   --   --   INR  --   --   --   --  1.4*  --   --   --   HEPARINUNFRC  --   --   --   --  >1.10*  --   --  0.78*  CREATININE 1.27*  --   --  1.11  --   --   --  1.18  TROPONINIHS 70*   < > 12,859*  2,390*  --  44,010* 10,263*  --   --    < > = values in this interval not displayed.     Estimated Creatinine Clearance: 43.3 mL/min (by C-G formula based on SCr of 1.18 mg/dL).   Medical History: Past Medical History:  Diagnosis Date   Arthritis    Bell palsy    Bell's palsy 04/12/2015   Cancer [facility])    prostate and skin   Chronic combined systolic and diastolic CHF, NYHA class 1 ([place])    a. 07/2014 Echo: EF 35-40%, Gr 1 DD.   Complete heart block ([place])    a. 11/2010 s/p SJM 2210 [facility] DC PPM, ser# [id number].   Depression    Diabetes mellitus without complication ([place])    Fall 11-10-14   GERD (gastroesophageal reflux disease)    History of prostate cancer    Hyperlipidemia    Hypertension    LBBB (left bundle branch block)    Left-sided Bell's palsy    Lung cancer ([place]) 2016   NICM (nonischemic cardiomyopathy) ([place])    a. 07/2014 Echo: EF 35-40%, mid-apicalanteroseptal DK, Gr 1 DD, mild-mod dil LA.   Non-obstructive CAD    a. 07/2014 Abnl MV;  b. 08/2014 Cath: LM nl, LAD 30p, RI 40p, LCX nl, OM1 40, RCA dominant 30p, 70d-->Med Rx.   Poor balance    Presence of permanent cardiac pacemaker    Sleep apnea    a. cpap   Vertigo    WPW  (Wolff-Parkinson-White syndrome)    a. S/P RFCA 1991.     Infusions:   sodium chloride     ceFEPime (MAXIPIME) IV 2 g (10/25/21 2112)   heparin 900 Units/hr (10/26/21 0530)     Assessment: Pharmacy consulted to start heparin for concern for ACS. Pt is on apixaban for hx of thromboembolism, splenic infarct. Will use aPTT for heparin monitoring as apixaban can falsely elevate heparin levels. Ordered baseline labs. CBC stable. Last dose of apixaban 11/2 @2256 .   1103 1822 aPTT 142s, suprathera; 1150 units/hr 1104 0434 aPTT 60, HL 0.78  Goal of Therapy:  Heparin level 0.3-0.7 units/ml aPTT 66-102 seconds Monitor platelets by anticoagulation  protocol: Yes   Plan:  --aPTT is slightly subtherapeutic --Increase infusion rate to 950 units/hr --Re-check aPTT 8 hrs after rate change --Daily CBC per protocol while on IV heparin  Renda Rolls, PharmD, [facility] 10/26/2021 6:57 AM

## 2021-10-26 NOTE — Consult Note (Signed)
Pharmacy Antibiotic Note  Howard [name] is a 87 y.o. male admitted on 10/24/2021 with pneumonia.  Pharmacy has been consulted for Cefepime dosing.   Plan: Cefepime 2g IV Q12 hours  Weight: 82.1 kg (181 lb 1.6 oz)  Temp (24hrs), Avg:98.3 F (36.8 C), Min:97.7 F (36.5 C), Max:98.6 F (37 C)  Recent Labs  Lab 10/24/21 0827 10/24/21 1435 10/25/21 0446 10/25/21 0902 10/26/21 0434  WBC 9.7  --   --  7.2 6.7  CREATININE 1.27*  --  1.11  --  1.18  LATICACIDVEN 3.1* 2.2*  --   --  0.8     Estimated Creatinine Clearance: 43.3 mL/min (by C-G formula based on SCr of 1.18 mg/dL).    Allergies  Allergen Reactions   Sulfa Antibiotics Rash    Antimicrobials this admission: Vancomycin 11/2 x 1 Cefepime 11/2 >>   Microbiology results: 11/2 BCx: pending 11/2 MRSA PCR: negative  Thank you for allowing pharmacy to be a part of this patient's care.  Howard [name] 10/26/2021 9:20 AM

## 2021-10-26 NOTE — TOC Initial Note (Signed)
Transition of Care [facility]) - Initial/Assessment Note    Patient Details  Name: Howard [name] MRN: [id number] Date of Birth: [birth year]  Transition of Care [facility]) CM/SW Contact:    Eileen Stanford, LCSW Phone Number: 10/26/2021, 9:50 AM  Clinical Narrative:  CSW spoke with pt's spouse. Pt lives with spouse. Pt sees Dr Rosanna Randy for primary care. Pt gets his meds from [facility] and gets some maintenance meds from mail service. Pt was getting outpatient PT at [facility]. CSW will send referral for pt to resume.                 Expected Discharge Plan: Home/Self Care Barriers to Discharge: Continued Medical Work up   Patient Goals and CMS Choice Patient states their goals for this hospitalization and ongoing recovery are:: to go home   Choice offered to / list presented to : Patient, Spouse  Expected Discharge Plan and Services Expected Discharge Plan: Home/Self Care In-house Referral: Clinical Social Work   Post Acute Care Choice: NA                     DME Agency: NA                  Prior Living Arrangements/Services   Lives with:: Spouse Patient language and need for interpreter reviewed:: Yes Do you feel safe going back to the place where you live?: Yes      Need for Family Participation in Patient Care: Yes (Comment) Care giver support system in place?: Yes (comment)   Criminal Activity/Legal Involvement Pertinent to Current Situation/Hospitalization: No - Comment as needed  Activities of Daily Living      Permission Sought/Granted Permission sought to share information with : Other (comment) Permission granted to share information with : Yes, Release of Information Signed  Share Information with NAME: [name]     Permission granted to share info w Relationship: Spouse     Emotional Assessment Appearance:: Appears stated age Attitude/Demeanor/Rapport: Engaged Affect (typically observed): Appropriate Orientation: : Oriented to Self, Oriented  to Place, Oriented to  Time, Oriented to Situation Alcohol / Substance Use: Not Applicable Psych Involvement: No (comment)  Admission diagnosis:  Hypoxia [R09.02] Acute respiratory failure with hypoxia ([place]) [J96.01] HCAP (healthcare-associated pneumonia) [J18.9] Acute on chronic HFrEF (heart failure with reduced ejection fraction) (HCC) [I50.23] Congestive heart failure, unspecified HF chronicity, unspecified heart failure type [facility]) [I50.9] Patient Active Problem List   Diagnosis Date Noted   Hypoxia    Acute on chronic HFrEF (heart failure with reduced ejection fraction) ([place]) 10/24/2021   Acute respiratory failure ([place])    Normocytic anemia 09/18/2021   Other fatigue 70/62/3762   Chronic systolic congestive heart failure ([place]) 07/18/2021   Goals of care, counseling/discussion 04/16/2021   Mediastinal lymphadenopathy 04/16/2021   Heart failure with reduced ejection fraction ([place])    AAA (abdominal aortic aneurysm) without rupture 12/10/2019   Renal artery stenosis ([place]) 12/10/2019   PAD (peripheral artery disease) ([place]) 12/10/2019   Thromboembolism ([place]) 08/30/2019   Atherosclerosis of native arteries of extremity with intermittent claudication ([place]) 08/23/2019   Swelling of limb 08/17/2019   Pain in limb 08/17/2019   NICM (nonischemic cardiomyopathy) ([place]) 07/01/2019   CHF (congestive heart failure) ([place]) 07/01/2019   Malignant neoplasm of right lung ([place]) 04/18/2016   CAD (coronary artery disease) 04/01/2016   Adenocarcinoma ([place]) 04/01/2016   Skin cyst 12/26/2015   GERD (gastroesophageal reflux disease) 06/16/2015   Abscess of back  06/06/2015   Vertigo 03/27/2015   Carpal tunnel syndrome 10/28/2014   Status post cholecystectomy 09/29/2014   Disease of digestive tract 09/29/2014   Cardiomyopathy ([place]) 09/26/2014   Type 2 diabetes mellitus without complications ([place])    Sleep apnea    Spinal stenosis, lumbar region, with neurogenic claudication 06/07/2014   Lumbar  stenosis with neurogenic claudication 06/07/2014   Abnormal gait 08/20/2012   H/O total knee replacement 08/20/2012   Arthritis of knee, degenerative 08/20/2012   Pacemaker-St.Jude 08/03/2012   Cardiac conduction disorder 06/19/2012   Acid reflux 06/18/2012   Nodal rhythm disorder 06/18/2012   Triggering of digit 03/25/2012   Hyperlipidemia 12/23/2011   Essential hypertension 03/23/2011   Atrioventricular block, complete ([place]) 03/23/2011   Complete atrioventricular block ([place]) 03/23/2011   PCP:  Jerrol Banana., MD Pharmacy:   [place], [address] [place] [address] Phone: [phone] Fax: [phone]  [place], [place] [address] [place] [address] Phone: [phone] Fax: [phone]  [facility] [place], [place] to [address] [place] [address] Phone: [phone] Fax: [place], [address] [place] [place] [address] Phone: [phone] Fax: [phone]     Social Determinants of Health ([place]) Interventions    Readmission Risk Interventions Readmission Risk Prevention Plan 10/26/2021 09/02/2019 08/28/2019  Transportation Screening Complete Complete Complete  PCP or Specialist Appt within 5-7 Days - - Complete  PCP or Specialist Appt within 3-5 Days Complete Complete -  Home Care Screening - - Complete  Medication Review (RN CM) - - Complete  HRI or Home Care Consult Complete Complete -  Social Work Consult for [place] Planning/Counseling Complete Complete -  Palliative Care Screening Not Applicable Not Applicable -  Medication Review (RN Care Manager) Complete Complete -  Some recent data might be hidden

## 2021-10-26 NOTE — Progress Notes (Signed)
PROGRESS NOTE    Howard [name]  KTG:[id number] DOB: [birth year] DOA: 10/24/2021 PCP: Jerrol Banana., MD   Brief Narrative: Taken from H&P.  Howard [name] is a 87 y.o. male with medical history significant of HFrEF, diabetes, gastric reflux, hypertension, hyperlipidemia, non-small cell lung cancer s/p radiation, chronic respiratory failure with emphysema, on 3 L of oxygen at home, CAD, presents to the emergency department with complaint of sudden onset shortness of breath and coughing spell where he did spit out mild blood-tinged thick sputum.  Patient completed his radiation treatment for lung cancer approximately 4 months ago. BNP markedly elevated at 4227, COVID-19 negative, procalcitonin negative, elevated lactic acid. CTA negative for PE but did show postradiation changes on right, and some new infiltrates.  Procalcitonin negative, troponin markedly elevated and peaked at 12 859. Cardiology and pulmonology both were consulted.  Patient is very high risk for deterioration and even death based on his age and underlying comorbidities.  Had another discussion with him and wife.  Looks like he is still not ready to give up or change the CODE STATUS, full code is not appropriate for him as it will result in more harm than benefit.  Palliative care was consulted.  Subjective: Patient was little more tachypneic when seen today.  Stating that he was becoming more short of breath and unable to get a good night sleep.  Denies any chest pain.  Wife at bedside.  Assessment & Plan:   Active Problems:   Acute on chronic HFrEF (heart failure with reduced ejection fraction) (HCC)   Hypoxia  Acute on chronic hypoxic respiratory failure.  Baseline oxygen requirement of 3 L, currently on 5 L.  Weaned off from BiPAP.  Procalcitonin remain negative but pulmonology would like to continue cefepime at this time.  Might have a mucous plugging which was removed with bouts of coughing.  Cough has been  improved.  Fungitell labs pending, Legionella and strep pneumo negative. Some concern of postradiation pneumonitis but CRP is not supporting. -Continue with cefepime -Continue supplemental oxygen and try weaning to baseline.  Acute on chronic HFrEF/history of CAD/NSTEMI.  Prior echocardiogram with EF of 20 to 25%, repeat pending.  Troponin peaked at 12 859 which makes it at a NSTEMI range. Cardiology is on board-appreciate their help.  They are recommending medical management. Repeat echocardiogram with EF of 20% and global hypokinesia. -Continue with home aspirin, Imdur, metoprolol and Entresto. -Continue with IV Lasix  None anion gap metabolic acidosis.  Lactic acidosis has been resolved, -Check ABG -Add p.o. bicarb  Lactic acidosis.  Most likely secondary to hypoxia, resolved. -Continue to monitor  History of non-small cell lung cancer.  S/p radiation, completed the course approximately 44-month ago. -Outpatient oncology follow-up   Hypertension. -Continue home meds as mentioned above.   Type 2 diabetes mellitus.  On metformin at home.  A1c of 8.2 -Hold metformin as he has lactic acidosis. -We will start [place] -Moderate scale SSI   Concern of depression/underlying dementia.  He was recently started on Zoloft which he has not taken yet. -Continue Aricept -Starting him on Zoloft  Objective: Vitals:   10/26/21 0121 10/26/21 0249 10/26/21 0733 10/26/21 1117  BP:  (!) 110/94 129/74 121/65  Pulse:  91 88 100  Resp:  18 18 18   Temp:  98.1 F (36.7 C) 98.3 F (36.8 C) 98.3 F (36.8 C)  TempSrc:  Oral    SpO2:  95% 98% 95%  Weight: 82.1 kg  Intake/Output Summary (Last 24 hours) at 10/26/2021 1451 Last data filed at 10/26/2021 0600 Gross per 24 hour  Intake 583.22 ml  Output 100 ml  Net 483.22 ml    Filed Weights   10/24/21 0833 10/25/21 0350 10/26/21 0121  Weight: 88.9 kg 84 kg 82.1 kg    Examination:  General.  Chronically ill-appearing gentleman, in no  acute distress. Pulmonary.  Coarse breath sounds, mildly increased work of breathing. CV.  Regular rate and rhythm, no JVD, rub or murmur. Abdomen.  Soft, nontender, nondistended, BS positive. CNS.  Alert and oriented .  No focal neurologic deficit. Extremities.  No edema, no cyanosis, pulses intact and symmetrical. Psychiatry.  Judgment and insight appears normal.   DVT prophylaxis: Eliquis Code Status: Full Family Communication: Wife was updated at bedside. Disposition Plan:  Status is: Inpatient  Remains inpatient appropriate because: Severity of illness  Level of care: Progressive Cardiac  All the records are reviewed and case discussed with Care Management/Social Worker. Management plans discussed with the patient, nursing and they are in agreement.  Consultants:  Cardiology Pulmonology Palliative care  Procedures:  Antimicrobials:  Cefepime  Data Reviewed: I have personally reviewed following labs and imaging studies  CBC: Recent Labs  Lab 10/24/21 0827 10/25/21 0902 10/26/21 0434  WBC 9.7 7.2 6.7  HGB 12.6* 11.1* 10.2*  HCT 39.3 33.2* 31.8*  MCV 85.4 83.6 85.3  PLT 282 208 427    Basic Metabolic Panel: Recent Labs  Lab 10/24/21 0827 10/25/21 0446 10/26/21 0434  NA 139 139 137  K 4.1 3.9 3.9  CL 110 111 111  CO2 18* 20* 15*  GLUCOSE 241* 160* 173*  BUN 25* 25* 30*  CREATININE 1.27* 1.11 1.18  CALCIUM 9.1 9.0 9.0    GFR: Estimated Creatinine Clearance: 43.3 mL/min (by C-G formula based on SCr of 1.18 mg/dL). Liver Function Tests: Recent Labs  Lab 10/24/21 0827  AST 21  ALT 15  ALKPHOS 49  BILITOT 0.8  PROT 7.2  ALBUMIN 3.8    No results for input(s): LIPASE, AMYLASE in the last 168 hours. No results for input(s): AMMONIA in the last 168 hours. Coagulation Profile: Recent Labs  Lab 10/25/21 0902  INR 1.4*   Cardiac Enzymes: No results for input(s): CKTOTAL, CKMB, CKMBINDEX, TROPONINI in the last 168 hours. BNP (last 3  results) No results for input(s): PROBNP in the last 8760 hours. HbA1C: Recent Labs    10/24/21 1728  HGBA1C 8.2*    CBG: Recent Labs  Lab 10/25/21 1122 10/25/21 1610 10/25/21 2025 10/26/21 0734 10/26/21 1118  GLUCAP 207* 165* 177* 178* 174*    Lipid Profile: No results for input(s): CHOL, HDL, LDLCALC, TRIG, CHOLHDL, LDLDIRECT in the last 72 hours. Thyroid Function Tests: No results for input(s): TSH, T4TOTAL, FREET4, T3FREE, THYROIDAB in the last 72 hours. Anemia Panel: No results for input(s): VITAMINB12, FOLATE, FERRITIN, TIBC, IRON, RETICCTPCT in the last 72 hours. Sepsis Labs: Recent Labs  Lab 10/24/21 0827 10/24/21 1435 10/25/21 0902 10/26/21 0434  PROCALCITON <0.10  --  <0.10 <0.10  LATICACIDVEN 3.1* 2.2*  --  0.8     Recent Results (from the past 240 hour(s))  Resp Panel by RT-PCR (Flu A&B, Covid) Nasopharyngeal Swab     Status: None   Collection Time: 10/24/21  8:27 AM   Specimen: Nasopharyngeal Swab; Nasopharyngeal(NP) swabs in vial transport medium  Result Value Ref Range Status   SARS Coronavirus 2 by RT PCR NEGATIVE NEGATIVE Final    Comment: (NOTE)  SARS-CoV-2 target nucleic acids are NOT DETECTED.  The SARS-CoV-2 RNA is generally detectable in upper respiratory specimens during the acute phase of infection. The lowest concentration of SARS-CoV-2 viral copies this assay can detect is 138 copies/mL. A negative result does not preclude SARS-Cov-2 infection and should not be used as the sole basis for treatment or other patient management decisions. A negative result may occur with  improper specimen collection/handling, submission of specimen other than nasopharyngeal swab, presence of viral mutation(s) within the areas targeted by this assay, and inadequate number of viral copies(<138 copies/mL). A negative result must be combined with clinical observations, patient history, and epidemiological information. The expected result is  Negative.  Fact Sheet for Patients:  EntrepreneurPulse.com.au  Fact Sheet for Healthcare Providers:  IncredibleEmployment.be  This test is no t yet approved or cleared by the [place] FDA and  has been authorized for detection and/or diagnosis of SARS-CoV-2 by FDA under an Emergency Use Authorization (EUA). This EUA will remain  in effect (meaning this test can be used) for the duration of the COVID-19 declaration under Section 564(b)(1) of the Act, 21 U.S.C.section 360bbb-3(b)(1), unless the authorization is terminated  or revoked sooner.       Influenza A by PCR NEGATIVE NEGATIVE Final   Influenza B by PCR NEGATIVE NEGATIVE Final    Comment: (NOTE) The Xpert Xpress SARS-CoV-2/FLU/RSV plus assay is intended as an aid in the diagnosis of influenza from Nasopharyngeal swab specimens and should not be used as a sole basis for treatment. Nasal washings and aspirates are unacceptable for Xpert Xpress SARS-CoV-2/FLU/RSV testing.  Fact Sheet for Patients: EntrepreneurPulse.com.au  Fact Sheet for Healthcare Providers: IncredibleEmployment.be  This test is not yet approved or cleared by the [place] FDA and has been authorized for detection and/or diagnosis of SARS-CoV-2 by FDA under an Emergency Use Authorization (EUA). This EUA will remain in effect (meaning this test can be used) for the duration of the COVID-19 declaration under Section 564(b)(1) of the Act, 21 U.S.C. section 360bbb-3(b)(1), unless the authorization is terminated or revoked.  Performed at [facility], [address] [place], [place] [address]   Blood culture (routine x 2)     Status: None (Preliminary result)   Collection Time: 10/24/21  9:47 AM   Specimen: BLOOD  Result Value Ref Range Status   Specimen Description BLOOD LEFT HAND  Final   Special Requests   Final    BOTTLES DRAWN AEROBIC AND ANAEROBIC Blood  Culture results may not be optimal due to an inadequate volume of blood received in culture bottles   Culture   Final    NO GROWTH 2 DAYS Performed at [facility], [address] [place], [place] [address]    Report Status PENDING  Incomplete  Blood culture (routine x 2)     Status: None (Preliminary result)   Collection Time: 10/24/21 10:04 AM   Specimen: BLOOD  Result Value Ref Range Status   Specimen Description BLOOD RIGHT FA  Final   Special Requests   Final    BOTTLES DRAWN AEROBIC AND ANAEROBIC Blood Culture results may not be optimal due to an inadequate volume of blood received in culture bottles   Culture   Final    NO GROWTH 2 DAYS Performed at [facility], [address], [place], [place] [address]    Report Status PENDING  Incomplete  MRSA Next Gen by PCR, Nasal     Status: None   Collection Time: 10/24/21  10:04 AM   Specimen: Nasal Mucosa; Nasal Swab  Result Value Ref Range Status   MRSA by PCR Next Gen NOT DETECTED NOT DETECTED Final    Comment: (NOTE) The GeneXpert MRSA Assay (FDA approved for NASAL specimens only), is one component of a comprehensive MRSA colonization surveillance program. It is not intended to diagnose MRSA infection nor to guide or monitor treatment for MRSA infections. Test performance is not FDA approved in patients less than 16 years old. Performed at [facility], [address] [place], [place] [address]       Radiology Studies: DG Chest [place] 1 View  Result Date: 10/25/2021 CLINICAL DATA:  87 year old male with history of shortness of breath and lung cancer with recent abnormal imaging. EXAM: PORTABLE CHEST 1 VIEW COMPARISON:  Chest CT of October 24, 2021 and chest radiograph of October 24, 2021. FINDINGS: LEFT-sided dual lead pacer device, leads project over the cardiac silhouette and power pack over the LEFT chest as before. EKG leads project over the chest. Cardiomediastinal contours are stable.  Increased interstitial and alveolar opacities seen more on the RIGHT than the LEFT have diminished in the short interval. Bandlike area of airspace disease in the RIGHT mid chest. No pneumothorax. On limited assessment there is no acute skeletal process. IMPRESSION: Increased interstitial and alveolar opacities seen on the RIGHT than the LEFT have diminished in the short interval. Likely reflecting improving edema and or volume overload. Bandlike area of airspace disease in the RIGHT mid chest likely atelectatic changes. Persistent basilar opacities in the RIGHT medial lower chest related to volume loss and post treatment changes, superimposed pneumonia would be difficult to exclude. Electronically Signed   By: Zetta Bills M.D.   On: 10/25/2021 11:32   ECHOCARDIOGRAM COMPLETE  Result Date: 10/25/2021    ECHOCARDIOGRAM REPORT   Patient Name:   Howard [name] Date of Exam: 10/25/2021 Medical Rec #:  [id number]      Height:       69.0 in Accession #:    [id number]     Weight:       185.2 lb Date of Birth:  05/12/1931       BSA:          2.000 m Patient Age:    32 years       BP:           123/69 mmHg Patient Gender: M              HR:           106 bpm. Exam Location:  [facility] Procedure: 2D Echo, Color Doppler and Cardiac Doppler Indications:     Elevated troponin  History:         Patient has prior history of Echocardiogram examinations, most                  recent 07/24/2021. CHF, CAD, Pacemaker; Risk Factors:Diabetes,                  Hypertension and Dyslipidemia. Hx of radiation therapy.  Sonographer:     Charmayne Sheer Referring Phys:  [id number] Marika Mahaffy Diagnosing Phys: [name] MD IMPRESSIONS  1. Left ventricular ejection fraction, by estimation, is 20 to 25%. The left ventricle has severely decreased function. The left ventricle demonstrates global hypokinesis. There is mild left ventricular hypertrophy. Left ventricular diastolic parameters  are consistent with Grade II diastolic dysfunction  (pseudonormalization).  2. Right ventricular systolic function is normal. The  right ventricular size is normal. Tricuspid regurgitation signal is inadequate for assessing PA pressure.  3. Left atrial size was moderately dilated.  4. The mitral valve is normal in structure. Mild to moderate mitral valve regurgitation. No evidence of mitral stenosis.  5. The aortic valve was not well visualized. Aortic valve regurgitation is not visualized. Mild to moderate aortic valve sclerosis/calcification is present, without any evidence of aortic stenosis.  6. There is borderline dilatation of the aortic root, measuring 37 mm.  7. The inferior vena cava is normal in size with greater than 50% respiratory variability, suggesting right atrial pressure of 3 mmHg. FINDINGS  Left Ventricle: Left ventricular ejection fraction, by estimation, is 20 to 25%. The left ventricle has severely decreased function. The left ventricle demonstrates global hypokinesis. The left ventricular internal cavity size was normal in size. There is mild left ventricular hypertrophy. Left ventricular diastolic parameters are consistent with Grade II diastolic dysfunction (pseudonormalization). Right Ventricle: The right ventricular size is normal. No increase in right ventricular wall thickness. Right ventricular systolic function is normal. Tricuspid regurgitation signal is inadequate for assessing PA pressure. Left Atrium: Left atrial size was moderately dilated. Right Atrium: Right atrial size was normal in size. Pericardium: There is no evidence of pericardial effusion. Mitral Valve: The mitral valve is normal in structure. There is mild thickening of the mitral valve leaflet(s). Mild to moderate mitral valve regurgitation. No evidence of mitral valve stenosis. MV peak gradient, 11.8 mmHg. The mean mitral valve gradient  is 4.0 mmHg. Tricuspid Valve: The tricuspid valve is normal in structure. Tricuspid valve regurgitation is not demonstrated. No  evidence of tricuspid stenosis. Aortic Valve: The aortic valve was not well visualized. Aortic valve regurgitation is not visualized. Mild to moderate aortic valve sclerosis/calcification is present, without any evidence of aortic stenosis. Aortic valve mean gradient measures 2.0 mmHg.  Aortic valve peak gradient measures 4.8 mmHg. Aortic valve area, by VTI measures 2.19 cm. Pulmonic Valve: The pulmonic valve was normal in structure. Pulmonic valve regurgitation is not visualized. No evidence of pulmonic stenosis. Aorta: The aortic root is normal in size and structure. There is borderline dilatation of the aortic root, measuring 37 mm. Venous: The inferior vena cava is normal in size with greater than 50% respiratory variability, suggesting right atrial pressure of 3 mmHg. IAS/Shunts: No atrial level shunt detected by color flow Doppler. Additional Comments: A device lead is visualized.  LEFT VENTRICLE PLAX 2D LVIDd:         4.70 cm   Diastology LVIDs:         3.46 cm   LV e' medial:    7.29 cm/s LV PW:         1.09 cm   LV E/e' medial:  20.3 LV IVS:        0.89 cm   LV e' lateral:   12.40 cm/s LVOT diam:     2.40 cm   LV E/e' lateral: 11.9 LV SV:         32 LV SV Index:   16 LVOT Area:     4.52 cm  LEFT ATRIUM             Index LA diam:        5.50 cm 2.75 cm/m LA Vol (A2C):   66.1 ml 33.06 ml/m LA Vol (A4C):   41.0 ml 20.50 ml/m LA Biplane Vol: 52.1 ml 26.06 ml/m  AORTIC VALVE  PULMONIC VALVE AV Area (Vmax):    2.14 cm     PV Vmax:       0.67 m/s AV Area (Vmean):   2.22 cm     PV Vmean:      49.300 cm/s AV Area (VTI):     2.19 cm     PV VTI:        0.094 m AV Vmax:           110.00 cm/s  PV Peak grad:  1.8 mmHg AV Vmean:          65.100 cm/s  PV Mean grad:  1.0 mmHg AV VTI:            0.144 m AV Peak Grad:      4.8 mmHg AV Mean Grad:      2.0 mmHg LVOT Vmax:         52.00 cm/s LVOT Vmean:        31.900 cm/s LVOT VTI:          0.070 m LVOT/AV VTI ratio: 0.48  AORTA Ao Root diam: 3.70 cm  MITRAL VALVE MV Area (PHT): 5.58 cm     SHUNTS MV Area VTI:   1.06 cm     Systemic VTI:  0.07 m MV Peak grad:  11.8 mmHg    Systemic Diam: 2.40 cm MV Mean grad:  4.0 mmHg MV Vmax:       1.72 m/s MV Vmean:      89.6 cm/s MV Decel Time: 136 msec MV E velocity: 148.00 cm/s MV A velocity: 51.40 cm/s MV E/A ratio:  2.88 [name] MD Electronically signed by [name] MD Signature Date/Time: 10/25/2021/5:46:45 PM    Final     Scheduled Meds:  aspirin EC  81 mg Oral Daily   cholecalciferol  1,000 Units Oral Daily   dapagliflozin propanediol  10 mg Oral Daily   donepezil  5 mg Oral Daily   ezetimibe  10 mg Oral Daily   fluticasone furoate-vilanterol  1 puff Inhalation Daily   And   umeclidinium bromide  1 puff Inhalation Daily   furosemide  40 mg Intravenous Daily   insulin aspart  0-15 Units Subcutaneous TID WC   insulin aspart  0-5 Units Subcutaneous QHS   isosorbide mononitrate  30 mg Oral Daily   loratadine  10 mg Oral Daily   metoprolol succinate  25 mg Oral QHS   multivitamin with minerals  1 tablet Oral Daily   pantoprazole  40 mg Oral Daily   sacubitril-valsartan  1 tablet Oral BID   sertraline  50 mg Oral QHS   simvastatin  40 mg Oral QHS   sodium bicarbonate  650 mg Oral TID   sodium chloride flush  3 mL Intravenous Q12H   spironolactone  25 mg Oral Daily   Continuous Infusions:  sodium chloride     ceFEPime (MAXIPIME) IV 2 g (10/26/21 0905)   heparin 950 Units/hr (10/26/21 0855)     LOS: 2 days   Time spent: 44 minutes. More than 50% of the time was spent in counseling/coordination of care  Lorella Nimrod, MD [facility]  If 7PM-7AM, please contact night-coverage [email or web address]  10/26/2021, 2:51 PM   This record has been created using Systems analyst. Errors have been sought and corrected,but may not always be located. Such creation errors do not reflect on the standard of care.

## 2021-10-26 NOTE — Consult Note (Signed)
ANTICOAGULATION CONSULT NOTE  Pharmacy Consult for Heparin Indication: chest pain/ACS  Patient Measurements: Weight: 82.1 kg (181 lb 1.6 oz) Heparin Dosing Weight: 84 kg  Labs: Recent Labs    10/24/21 0827 10/24/21 0827 10/24/21 1036 10/24/21 1728 10/25/21 0446 10/25/21 0902 10/25/21 1033 10/25/21 1822 10/26/21 0434 10/26/21 1648  HGB 12.6*  --   --   --   --  11.1*  --   --  10.2*  --   HCT 39.3  --   --   --   --  33.2*  --   --  31.8*  --   PLT 282  --   --   --   --  208  --   --  191  --   APTT  --    < >  --   --   --  42*  --  142* 60* 71*  LABPROT  --   --   --   --   --  17.4*  --   --   --   --   INR  --   --   --   --   --  1.4*  --   --   --   --   HEPARINUNFRC  --   --   --   --   --  >1.10*  --   --  0.78*  --   CREATININE 1.27*  --   --   --  1.11  --   --   --  1.18  --   TROPONINIHS 70*  --    < > 12,859*  2,390*  --  32,355* 10,263*  --   --   --    < > = values in this interval not displayed.     Estimated Creatinine Clearance: 43.3 mL/min (by C-G formula based on SCr of 1.18 mg/dL).   Medical History: Past Medical History:  Diagnosis Date   Arthritis    Bell palsy    Bell's palsy 04/12/2015   Cancer [facility])    prostate and skin   Chronic combined systolic and diastolic CHF, NYHA class 1 ([place])    a. 07/2014 Echo: EF 35-40%, Gr 1 DD.   Complete heart block ([place])    a. 11/2010 s/p SJM 2210 [facility] DC PPM, ser# [id number].   Depression    Diabetes mellitus without complication ([place])    Fall 11-10-14   GERD (gastroesophageal reflux disease)    History of prostate cancer    Hyperlipidemia    Hypertension    LBBB (left bundle branch block)    Left-sided Bell's palsy    Lung cancer ([place]) 2016   NICM (nonischemic cardiomyopathy) ([place])    a. 07/2014 Echo: EF 35-40%, mid-apicalanteroseptal DK, Gr 1 DD, mild-mod dil LA.   Non-obstructive CAD    a. 07/2014 Abnl MV;  b. 08/2014 Cath: LM nl, LAD 30p, RI 40p, LCX nl, OM1 40, RCA dominant 30p, 70d-->Med Rx.    Poor balance    Presence of permanent cardiac pacemaker    Sleep apnea    a. cpap   Vertigo    WPW (Wolff-Parkinson-White syndrome)    a. S/P RFCA 1991.     Infusions:   sodium chloride     ceFEPime (MAXIPIME) IV 2 g (10/26/21 0905)   heparin 950 Units/hr (10/26/21 0855)     Assessment: Pharmacy consulted to start heparin for concern for ACS. Pt is on apixaban for hx of thromboembolism,  splenic infarct. Will use aPTT for heparin monitoring as apixaban can falsely elevate heparin levels. Ordered baseline labs. CBC stable. Last dose of apixaban 11/2 @2256 .   1103 1822 aPTT 142s, suprathera; 1150 units/hr 1104 0434 aPTT 60, HL 0.78 1104 1648 aPTT 71, therapeutic   Goal of Therapy:  Heparin level 0.3-0.7 units/ml aPTT 66-102 seconds Monitor platelets by anticoagulation protocol: Yes   Plan:  aPTT therapeutic, continue heparin infusion rate at 950 units/hr Re-check aPTT in 8 hrs to confirm.  Check HL with AM labs and switch to monitoring HL once correlating with aPTT Daily CBC per protocol while on IV heparin  Sherilyn Banker, PharmD Clinical Pharmacist  10/26/2021 5:43 PM

## 2021-10-26 NOTE — Progress Notes (Signed)
Occupational Therapy Treatment Patient Details Name: Howard [name] MRN: [id number] DOB: Nov 27, 1931 Today's Date: 10/26/2021   History of present illness Howard [name] is a 87 y.o. male with medical history significant of HFrEF, diabetes, gastric reflux, hypertension, hyperlipidemia, non-small cell lung cancer s/p radiation, chronic respiratory failure with emphysema, on 3 L of oxygen at home, CAD, presents to the emergency department with complaint of sudden onset shortness of breath.   OT comments  Pt seen for OT tx this date to f/u re: safety with ADLs/ADL mobility. Pt reports feeling more poorly today, and bouts of dizziness, negative for orthostatic hypotension. Pt's spouse reports that he has been feeling more SOB today. Pt requires MIN A with transfers, MIN A/CGA for seated and standing LB ADLs such as peri care and dressign to don clean brief. Pt noted to have difficulty with LB tasks 2/2 SOB but also some generally decreased tolerance today. Pt requires SETUP to MIN A for seated UB ADLs including UB bathing/dressing. Pt returned to bed end of session with all needs met and in reach. RN notified of session contents and that pt performing worse today versus yesterday. OT updates d/c dispo to [facility] as pt with decreased tolerance for ADLs demonstrated on today's session.    Recommendations for follow up therapy are one component of a multi-disciplinary discharge planning process, led by the attending physician.  Recommendations may be updated based on patient status, additional functional criteria and insurance authorization.    Follow Up Recommendations  Home health OT    Assistance Recommended at Discharge Intermittent Supervision/Assistance  Equipment Recommendations  Tub/shower seat    Recommendations for Other Services      Precautions / Restrictions Precautions Precautions: Fall Restrictions Weight Bearing Restrictions: No       Mobility Bed Mobility Overal bed mobility:  Needs Assistance Bed Mobility: Supine to Sit;Sit to Supine     Supine to sit: Min guard;HOB elevated Sit to supine: Supervision   General bed mobility comments: increased time    Transfers Overall transfer level: Needs assistance Equipment used: Rolling walker (2 wheels) Transfers: Sit to/from Stand Sit to Stand: Min assist           General transfer comment: decreased balance and standing tolernace today, pt's spouse reports increased RR this date.     Balance Overall balance assessment: History of Falls;Needs assistance Sitting-balance support: Feet supported Sitting balance-Leahy Scale: Good     Standing balance support: Bilateral upper extremity supported;During functional activity Standing balance-Leahy Scale: Fair Standing balance comment: more dependent on UE support this date                           ADL either performed or assessed with clinical judgement   ADL Overall ADL's : Needs assistance/impaired         Upper Body Bathing: Set up;Sitting   Lower Body Bathing: Minimal assistance;Sit to/from stand Lower Body Bathing Details (indicate cue type and reason): RW for balance, CGA for stand, MIN A for dynamic balance when performing posterior peri care. Upper Body Dressing : Set up;Sitting   Lower Body Dressing: Min guard;Minimal assistance;Sitting/lateral leans;Sit to/from stand Lower Body Dressing Details (indicate cue type and reason): sit/lateral lean to thread briefs MIN A/CGA for dynamic balance, CGA for standing clothing mgt over hips.                     Vision Baseline Vision/History: 1 Wears glasses Ability  to See in Adequate Light: 0 Adequate Patient Visual Report: No change from baseline     Perception     Praxis      Cognition Arousal/Alertness: Awake/alert Behavior During Therapy: WFL for tasks assessed/performed Overall Cognitive Status: Within Functional Limits for tasks assessed                                             Exercises Other Exercises Other Exercises: OT engaes pt in UB/LB bathing/dressing tasks.   Shoulder Instructions       General Comments      Pertinent Vitals/ Pain       Pain Assessment: No/denies pain  Home Living                                          Prior Functioning/Environment              Frequency  Min 2X/week        Progress Toward Goals  OT Goals(current goals can now be found in the care plan section)  Progress towards OT goals: OT to reassess next treatment (pt with slightly less tolerance today, reports feeling worse, his wife reports he has been somewhat more SOB. Pt c/o some diziness, Orthostatics negative.)  Acute Rehab OT Goals Patient Stated Goal: to get home health therapy before easing back into OP therapy OT Goal Formulation: With patient/family Time For Goal Achievement: 11/08/21 Potential to Achieve Goals: Good  Plan Discharge plan needs to be updated;Frequency remains appropriate    Co-evaluation                 AM-PAC OT "6 Clicks" Daily Activity     Outcome Measure   Help from another person eating meals?: None Help from another person taking care of personal grooming?: A Little Help from another person toileting, which includes using toliet, bedpan, or urinal?: A Little Help from another person bathing (including washing, rinsing, drying)?: A Little Help from another person to put on and taking off regular upper body clothing?: A Little Help from another person to put on and taking off regular lower body clothing?: A Little 6 Click Score: 19    End of Session Equipment Utilized During Treatment: Gait belt;Rolling walker (2 wheels);Oxygen  OT Visit Diagnosis: Unsteadiness on feet (R26.81)   Activity Tolerance Patient tolerated treatment well   Patient Left with call bell/phone within reach;with family/visitor present;in bed;with bed alarm set   Nurse Communication  Mobility status        Time: 4076-8088 OT Time Calculation (min): 41 min  Charges: OT General Charges $OT Visit: 1 Visit OT Treatments $Self Care/Home Management : 23-37 mins $Therapeutic Activity: 8-22 mins  Gerrianne Scale, [place], OTR/L ascom [phone] 10/26/21, 5:17 PM

## 2021-10-26 NOTE — Care Management Important Message (Signed)
Important Message  Patient Details  Name: Howard [name] MRN: [id number] Date of Birth: 09-Feb-1931   Medicare Important Message Given:  Yes     Dannette Barbara 10/26/2021, 1:21 PM

## 2021-10-27 DIAGNOSIS — J9601 Acute respiratory failure with hypoxia: Secondary | ICD-10-CM | POA: Diagnosis not present

## 2021-10-27 DIAGNOSIS — I5023 Acute on chronic systolic (congestive) heart failure: Secondary | ICD-10-CM | POA: Diagnosis not present

## 2021-10-27 DIAGNOSIS — I509 Heart failure, unspecified: Secondary | ICD-10-CM | POA: Diagnosis not present

## 2021-10-27 DIAGNOSIS — R0902 Hypoxemia: Secondary | ICD-10-CM | POA: Diagnosis not present

## 2021-10-27 LAB — CBC
HCT: 29.6 % — ABNORMAL LOW (ref 39.0–52.0)
Hemoglobin: 9.7 g/dL — ABNORMAL LOW (ref 13.0–17.0)
MCH: 27.3 pg (ref 26.0–34.0)
MCHC: 32.8 g/dL (ref 30.0–36.0)
MCV: 83.4 fL (ref 80.0–100.0)
Platelets: 190 10*3/uL (ref 150–400)
RBC: 3.55 MIL/uL — ABNORMAL LOW (ref 4.22–5.81)
RDW: 17.7 % — ABNORMAL HIGH (ref 11.5–15.5)
WBC: 4.9 10*3/uL (ref 4.0–10.5)
nRBC: 0 % (ref 0.0–0.2)

## 2021-10-27 LAB — COMPREHENSIVE METABOLIC PANEL
ALT: 18 U/L (ref 0–44)
AST: 26 U/L (ref 15–41)
Albumin: 3.2 g/dL — ABNORMAL LOW (ref 3.5–5.0)
Alkaline Phosphatase: 48 U/L (ref 38–126)
Anion gap: 9 (ref 5–15)
BUN: 36 mg/dL — ABNORMAL HIGH (ref 8–23)
CO2: 20 mmol/L — ABNORMAL LOW (ref 22–32)
Calcium: 8.9 mg/dL (ref 8.9–10.3)
Chloride: 107 mmol/L (ref 98–111)
Creatinine, Ser: 1.3 mg/dL — ABNORMAL HIGH (ref 0.61–1.24)
GFR, Estimated: 53 mL/min — ABNORMAL LOW (ref >60)
Glucose, Bld: 218 mg/dL — ABNORMAL HIGH (ref 70–99)
Potassium: 3.7 mmol/L (ref 3.5–5.1)
Sodium: 136 mmol/L (ref 135–145)
Total Bilirubin: 0.7 mg/dL (ref 0.3–1.2)
Total Protein: 6.9 g/dL (ref 6.5–8.1)

## 2021-10-27 LAB — GLUCOSE, CAPILLARY
Glucose-Capillary: 158 mg/dL — ABNORMAL HIGH (ref 70–99)
Glucose-Capillary: 155 mg/dL — ABNORMAL HIGH (ref 70–99)
Glucose-Capillary: 247 mg/dL — ABNORMAL HIGH (ref 70–99)
Glucose-Capillary: 133 mg/dL — ABNORMAL HIGH (ref 70–99)

## 2021-10-27 LAB — BASIC METABOLIC PANEL
Anion gap: 10 (ref 5–15)
BUN: 32 mg/dL — ABNORMAL HIGH (ref 8–23)
CO2: 19 mmol/L — ABNORMAL LOW (ref 22–32)
Calcium: 8.7 mg/dL — ABNORMAL LOW (ref 8.9–10.3)
Chloride: 104 mmol/L (ref 98–111)
Creatinine, Ser: 1.1 mg/dL (ref 0.61–1.24)
GFR, Estimated: >60 mL/min (ref >60)
Glucose, Bld: 297 mg/dL — ABNORMAL HIGH (ref 70–99)
Potassium: 3.5 mmol/L (ref 3.5–5.1)
Sodium: 133 mmol/L — ABNORMAL LOW (ref 135–145)

## 2021-10-27 LAB — APTT
aPTT: 87 seconds — ABNORMAL HIGH (ref 24–36)
aPTT: >200 seconds (ref 24–36)
aPTT: 77 seconds — ABNORMAL HIGH (ref 24–36)

## 2021-10-27 LAB — HEPARIN LEVEL (UNFRACTIONATED)
Heparin Unfractionated: >1.1 IU/mL — ABNORMAL HIGH (ref 0.30–0.70)
Heparin Unfractionated: 0.44 IU/mL (ref 0.30–0.70)

## 2021-10-27 LAB — FUNGITELL, SERUM: Fungitell Result: <31 pg/mL (ref <80)

## 2021-10-27 LAB — PROCALCITONIN: Procalcitonin: 0.1 ng/mL

## 2021-10-27 MED ORDER — APIXABAN 5 MG PO TABS
5.0000 mg | ORAL_TABLET | Freq: Two times a day (BID) | ORAL | Status: DC
  Administered 2021-10-27 – 2021-10-28 (×3): 5 mg via ORAL
  Filled 2021-10-27 (×3): qty 1

## 2021-10-27 MED ORDER — FUROSEMIDE 20 MG PO TABS
20.0000 mg | ORAL_TABLET | Freq: Every day | ORAL | Status: DC
  Administered 2021-10-28: 20 mg via ORAL
  Filled 2021-10-27: qty 1

## 2021-10-27 MED ORDER — SODIUM BICARBONATE 650 MG PO TABS: 650.0000 mg | ORAL_TABLET | Freq: Three times a day (TID) | ORAL | 0 refills | Status: DC

## 2021-10-27 NOTE — Progress Notes (Signed)
PROGRESS NOTE    Howard [name]  HYW:[id number] DOB: 1931-07-10 DOA: 10/24/2021 PCP: Jerrol Banana., MD   Brief Narrative: Taken from H&P.  Howard [name] is a 87 y.o. male with medical history significant of HFrEF, diabetes, gastric reflux, hypertension, hyperlipidemia, non-small cell lung cancer s/p radiation, chronic respiratory failure with emphysema, on 3 L of oxygen at home, CAD, presents to the emergency department with complaint of sudden onset shortness of breath and coughing spell where he did spit out mild blood-tinged thick sputum.  Patient completed his radiation treatment for lung cancer approximately 4 months ago. BNP markedly elevated at 4227, COVID-19 negative, procalcitonin negative, elevated lactic acid. CTA negative for PE but did show postradiation changes on right, and some new infiltrates.  Procalcitonin negative, troponin markedly elevated and peaked at 12 859. Cardiology and pulmonology both were consulted.  Patient is very high risk for deterioration and even death based on his age and underlying comorbidities.  Had another discussion with him and wife.  Looks like he is still not ready to give up or change the CODE STATUS, full code is not appropriate for him as it will result in more harm than benefit.  Palliative care was consulted.  Subjective: Patient has no new complaint today.  Wife at bedside.  She was concerned about how she will take care of him at home.  PT is recommending home health as he does not qualify for SNF at this time. Tried explaining to wife that either they can look for long-term facility or pay out-of-pocket.  Both can be done as an outpatient.  She was not ready to take him back home today.  Cardiology cleared for discharge.  Assessment & Plan:   Active Problems:   Acute on chronic HFrEF (heart failure with reduced ejection fraction) (HCC)   Hypoxia  Acute on chronic hypoxic respiratory failure.  Baseline oxygen requirement of 3 L,  currently on 4 L.  Weaned off from BiPAP.  Procalcitonin remain negative but pulmonology would like to continue cefepime at this time.  Might have a mucous plugging which was removed with bouts of coughing.  Cough has been improved.  Fungitell labs pending, Legionella and strep pneumo negative. Some concern of postradiation pneumonitis but CRP is not supporting. -Continue with cefepime -Continue supplemental oxygen and try weaning to baseline.  Acute on chronic HFrEF/history of CAD/NSTEMI.  Prior echocardiogram with EF of 20 to 25%, repeat pending.  Troponin peaked at 12 859 which makes it at a NSTEMI range. Cardiology is on board-appreciate their help.  They are recommending medical management. Repeat echocardiogram with EF of 20% and global hypokinesia. -Continue with home aspirin, Imdur, metoprolol and Entresto. -Continue with IV Lasix  None anion gap metabolic acidosis.  Lactic acidosis has been resolved, metabolic acidosis improving. -Continue p.o. bicarb  Lactic acidosis.  Most likely secondary to hypoxia, resolved. -Continue to monitor  History of non-small cell lung cancer.  S/p radiation, completed the course approximately 8-month ago. -Outpatient oncology follow-up   Hypertension. -Continue home meds as mentioned above.   Type 2 diabetes mellitus.  On metformin at home.  A1c of 8.2 -Hold metformin as he has lactic acidosis. -We will start Holt -Moderate scale SSI   Concern of depression/underlying dementia.  He was recently started on Zoloft which he has not taken yet. -Continue Aricept -Starting him on Zoloft  Objective: Vitals:   10/27/21 0017 10/27/21 0459 10/27/21 0729 10/27/21 1154  BP: 120/77 139/84 (!) 141/79 115/62  Pulse: 86 (!) 104 86 83  Resp: 18 17 18 17   Temp: 97.9 F (36.6 C) 98.4 F (36.9 C) 97.9 F (36.6 C) 97.8 F (36.6 C)  TempSrc:  Oral    SpO2: 96% 93% 96% 96%  Weight:  82.9 kg      Intake/Output Summary (Last 24 hours) at 10/27/2021  1451 Last data filed at 10/27/2021 1340 Gross per 24 hour  Intake 925.67 ml  Output 675 ml  Net 250.67 ml    Filed Weights   10/25/21 0350 10/26/21 0121 10/27/21 0459  Weight: 84 kg 82.1 kg 82.9 kg    Examination:  General.  Ill-appearing elderly man, in no acute distress. Pulmonary.  Lungs clear bilaterally, normal respiratory effort. CV.  Regular rate and rhythm, no JVD, rub or murmur. Abdomen.  Soft, nontender, nondistended, BS positive. CNS.  Alert and oriented .  No focal neurologic deficit. Extremities.  No edema, no cyanosis, pulses intact and symmetrical. Psychiatry.  Judgment and insight appears normal.   DVT prophylaxis: Eliquis Code Status: Full Family Communication: Wife was updated at bedside. Disposition Plan:  Status is: Inpatient  Remains inpatient appropriate because: Severity of illness  Level of care: Progressive Cardiac  All the records are reviewed and case discussed with Care Management/Social Worker. Management plans discussed with the patient, nursing and they are in agreement.  Consultants:  Cardiology Pulmonology Palliative care  Procedures:  Antimicrobials:  Cefepime  Data Reviewed: I have personally reviewed following labs and imaging studies  CBC: Recent Labs  Lab 10/24/21 0827 10/25/21 0902 10/26/21 0434 10/27/21 0308  WBC 9.7 7.2 6.7 4.9  HGB 12.6* 11.1* 10.2* 9.7*  HCT 39.3 33.2* 31.8* 29.6*  MCV 85.4 83.6 85.3 83.4  PLT 282 208 191 784    Basic Metabolic Panel: Recent Labs  Lab 10/24/21 0827 10/25/21 0446 10/26/21 0434 10/27/21 0308  NA 139 139 137 133*  K 4.1 3.9 3.9 3.5  CL 110 111 111 104  CO2 18* 20* 15* 19*  GLUCOSE 241* 160* 173* 297*  BUN 25* 25* 30* 32*  CREATININE 1.27* 1.11 1.18 1.10  CALCIUM 9.1 9.0 9.0 8.7*    GFR: Estimated Creatinine Clearance: 46.4 mL/min (by C-G formula based on SCr of 1.1 mg/dL). Liver Function Tests: Recent Labs  Lab 10/24/21 0827  AST 21  ALT 15  ALKPHOS 49   BILITOT 0.8  PROT 7.2  ALBUMIN 3.8    No results for input(s): LIPASE, AMYLASE in the last 168 hours. No results for input(s): AMMONIA in the last 168 hours. Coagulation Profile: Recent Labs  Lab 10/25/21 0902  INR 1.4*    Cardiac Enzymes: No results for input(s): CKTOTAL, CKMB, CKMBINDEX, TROPONINI in the last 168 hours. BNP (last 3 results) No results for input(s): PROBNP in the last 8760 hours. HbA1C: Recent Labs    10/24/21 1728  HGBA1C 8.2*    CBG: Recent Labs  Lab 10/26/21 1118 10/26/21 1639 10/26/21 2103 10/27/21 0726 10/27/21 1212  GLUCAP 174* 232* 188* 158* 155*    Lipid Profile: No results for input(s): CHOL, HDL, LDLCALC, TRIG, CHOLHDL, LDLDIRECT in the last 72 hours. Thyroid Function Tests: No results for input(s): TSH, T4TOTAL, FREET4, T3FREE, THYROIDAB in the last 72 hours. Anemia Panel: No results for input(s): VITAMINB12, FOLATE, FERRITIN, TIBC, IRON, RETICCTPCT in the last 72 hours. Sepsis Labs: Recent Labs  Lab 10/24/21 0827 10/24/21 1435 10/25/21 0902 10/26/21 0434 10/27/21 0308  PROCALCITON <0.10  --  <0.10 <0.10 0.10  LATICACIDVEN 3.1* 2.2*  --  0.8  --      Recent Results (from the past 240 hour(s))  Resp Panel by RT-PCR (Flu A&B, Covid) Nasopharyngeal Swab     Status: None   Collection Time: 10/24/21  8:27 AM   Specimen: Nasopharyngeal Swab; Nasopharyngeal(NP) swabs in vial transport medium  Result Value Ref Range Status   SARS Coronavirus 2 by RT PCR NEGATIVE NEGATIVE Final    Comment: (NOTE) SARS-CoV-2 target nucleic acids are NOT DETECTED.  The SARS-CoV-2 RNA is generally detectable in upper respiratory specimens during the acute phase of infection. The lowest concentration of SARS-CoV-2 viral copies this assay can detect is 138 copies/mL. A negative result does not preclude SARS-Cov-2 infection and should not be used as the sole basis for treatment or other patient management decisions. A negative result may occur with   improper specimen collection/handling, submission of specimen other than nasopharyngeal swab, presence of viral mutation(s) within the areas targeted by this assay, and inadequate number of viral copies(<138 copies/mL). A negative result must be combined with clinical observations, patient history, and epidemiological information. The expected result is Negative.  Fact Sheet for Patients:  EntrepreneurPulse.com.au  Fact Sheet for Healthcare Providers:  IncredibleEmployment.be  This test is no t yet approved or cleared by the [place] FDA and  has been authorized for detection and/or diagnosis of SARS-CoV-2 by FDA under an Emergency Use Authorization (EUA). This EUA will remain  in effect (meaning this test can be used) for the duration of the COVID-19 declaration under Section 564(b)(1) of the Act, 21 U.S.C.section 360bbb-3(b)(1), unless the authorization is terminated  or revoked sooner.       Influenza A by PCR NEGATIVE NEGATIVE Final   Influenza B by PCR NEGATIVE NEGATIVE Final    Comment: (NOTE) The Xpert Xpress SARS-CoV-2/FLU/RSV plus assay is intended as an aid in the diagnosis of influenza from Nasopharyngeal swab specimens and should not be used as a sole basis for treatment. Nasal washings and aspirates are unacceptable for Xpert Xpress SARS-CoV-2/FLU/RSV testing.  Fact Sheet for Patients: EntrepreneurPulse.com.au  Fact Sheet for Healthcare Providers: IncredibleEmployment.be  This test is not yet approved or cleared by the [place] FDA and has been authorized for detection and/or diagnosis of SARS-CoV-2 by FDA under an Emergency Use Authorization (EUA). This EUA will remain in effect (meaning this test can be used) for the duration of the COVID-19 declaration under Section 564(b)(1) of the Act, 21 U.S.C. section 360bbb-3(b)(1), unless the authorization is terminated  or revoked.  Performed at [facility], [address] [place], [place] [address]   Blood culture (routine x 2)     Status: None (Preliminary result)   Collection Time: 10/24/21  9:47 AM   Specimen: BLOOD  Result Value Ref Range Status   Specimen Description BLOOD LEFT HAND  Final   Special Requests   Final    BOTTLES DRAWN AEROBIC AND ANAEROBIC Blood Culture results may not be optimal due to an inadequate volume of blood received in culture bottles   Culture   Final    NO GROWTH 3 DAYS Performed at [facility], [address], [place], [place] [address]    Report Status PENDING  Incomplete  Blood culture (routine x 2)     Status: None (Preliminary result)   Collection Time: 10/24/21 10:04 AM   Specimen: BLOOD  Result Value Ref Range Status   Specimen Description BLOOD RIGHT FA  Final   Special Requests   Final    BOTTLES DRAWN  AEROBIC AND ANAEROBIC Blood Culture results may not be optimal due to an inadequate volume of blood received in culture bottles   Culture   Final    NO GROWTH 3 DAYS Performed at [facility], [address] [place], [place] [address]    Report Status PENDING  Incomplete  MRSA Next Gen by PCR, Nasal     Status: None   Collection Time: 10/24/21 10:04 AM   Specimen: Nasal Mucosa; Nasal Swab  Result Value Ref Range Status   MRSA by PCR Next Gen NOT DETECTED NOT DETECTED Final    Comment: (NOTE) The GeneXpert MRSA Assay (FDA approved for NASAL specimens only), is one component of a comprehensive MRSA colonization surveillance program. It is not intended to diagnose MRSA infection nor to guide or monitor treatment for MRSA infections. Test performance is not FDA approved in patients less than 49 years old. Performed at [facility], [address] [place], [place] [address]       Radiology Studies: No results found.  Scheduled Meds:  apixaban  5 mg Oral BID   aspirin EC  81 mg Oral Daily    cholecalciferol  1,000 Units Oral Daily   dapagliflozin propanediol  10 mg Oral Daily   donepezil  5 mg Oral Daily   ezetimibe  10 mg Oral Daily   fluticasone furoate-vilanterol  1 puff Inhalation Daily   And   umeclidinium bromide  1 puff Inhalation Daily   [START ON 10/28/2021] furosemide  20 mg Oral Daily   insulin aspart  0-15 Units Subcutaneous TID WC   insulin aspart  0-5 Units Subcutaneous QHS   isosorbide mononitrate  30 mg Oral Daily   loratadine  10 mg Oral Daily   metoprolol succinate  25 mg Oral QHS   multivitamin with minerals  1 tablet Oral Daily   pantoprazole  40 mg Oral Daily   sacubitril-valsartan  1 tablet Oral BID   sertraline  50 mg Oral QHS   simvastatin  40 mg Oral QHS   sodium bicarbonate  650 mg Oral TID   sodium chloride flush  3 mL Intravenous Q12H   spironolactone  25 mg Oral Daily   Continuous Infusions:  sodium chloride     ceFEPime (MAXIPIME) IV 2 g (10/27/21 1248)     LOS: 3 days   Time spent: 43 minutes. More than 50% of the time was spent in counseling/coordination of care  Lorella Nimrod, MD [facility]  If 7PM-7AM, please contact night-coverage [email or web address]  10/27/2021, 2:51 PM   This record has been created using Systems analyst. Errors have been sought and corrected,but may not always be located. Such creation errors do not reflect on the standard of care.

## 2021-10-27 NOTE — Progress Notes (Signed)
Mobility Specialist - Progress Note   10/27/21 1500  Mobility  Activity Refused mobility  Mobility performed by Mobility specialist    Pt fatigued from ambulating with PT moments prior. Will attempt session another date/time.    Kathee Delton Mobility Specialist 10/27/21, 3:20 PM

## 2021-10-27 NOTE — Consult Note (Signed)
ANTICOAGULATION CONSULT NOTE  Pharmacy Consult for Heparin Indication: chest pain/ACS  Patient Measurements: Weight: 82.9 kg (182 lb 12.8 oz) Heparin Dosing Weight: 84 kg  Labs: Recent Labs    10/24/21 0827 10/24/21 1728 10/25/21 0446 10/25/21 0902 10/25/21 1033 10/25/21 1822 10/26/21 0434 10/26/21 1648 10/27/21 0040 10/27/21 0308 10/27/21 0549  HGB  --   --   --  11.1*  --   --  10.2*  --   --  9.7*  --   HCT  --   --   --  33.2*  --   --  31.8*  --   --  29.6*  --   PLT  --   --   --  208  --   --  191  --   --  190  --   APTT  --   --   --  42*  --    < > 60*   < > 87* >200* 77*  LABPROT  --   --   --  17.4*  --   --   --   --   --   --   --   INR  --   --   --  1.4*  --   --   --   --   --   --   --   HEPARINUNFRC   < >  --   --  >1.10*  --   --  0.78*  --   --  >1.10* 0.44  CREATININE  --   --  1.11  --   --   --  1.18  --   --  1.10  --   TROPONINIHS  --  12,859*  2,390*  --  31,497* 10,263*  --   --   --   --   --   --    < > = values in this interval not displayed.     Estimated Creatinine Clearance: 46.4 mL/min (by C-G formula based on SCr of 1.1 mg/dL).   Medical History: Past Medical History:  Diagnosis Date   Arthritis    Bell palsy    Bell's palsy 04/12/2015   Cancer [facility])    prostate and skin   Chronic combined systolic and diastolic CHF, NYHA class 1 ([place])    a. 07/2014 Echo: EF 35-40%, Gr 1 DD.   Complete heart block ([place])    a. 11/2010 s/p SJM [facility] PPM, ser# [id number].   Depression    Diabetes mellitus without complication ([place])    Fall 11-10-14   GERD (gastroesophageal reflux disease)    History of prostate cancer    Hyperlipidemia    Hypertension    LBBB (left bundle branch block)    Left-sided Bell's palsy    Lung cancer ([place]) 2016   NICM (nonischemic cardiomyopathy) ([place])    a. 07/2014 Echo: EF 35-40%, mid-apicalanteroseptal DK, Gr 1 DD, mild-mod dil LA.   Non-obstructive CAD    a. 07/2014 Abnl MV;  b. 08/2014 Cath: LM nl, LAD  30p, RI 40p, LCX nl, OM1 40, RCA dominant 30p, 70d-->Med Rx.   Poor balance    Presence of permanent cardiac pacemaker    Sleep apnea    a. cpap   Vertigo    WPW (Wolff-Parkinson-White syndrome)    a. S/P RFCA 1991.     Infusions:   sodium chloride     ceFEPime (MAXIPIME) IV 2 g (10/26/21 2304)   heparin  950 Units/hr (10/27/21 0734)     Assessment: Pharmacy consulted to start heparin for concern for ACS. Pt is on apixaban for hx of thromboembolism, splenic infarct. Will use aPTT for heparin monitoring as apixaban can falsely elevate heparin levels. Ordered baseline labs. CBC stable. Last dose of apixaban 11/2 at 2256.   Goal of Therapy:  Heparin level 0.3-0.7 units/ml Monitor platelets by anticoagulation protocol: Yes   Plan:  aPTT & HL are therapeutic and correlating Continue heparin infusion at 950 units/hr Re-check confirmatory HL in 8 hours (given concern for initial erroneous aPTT drawn 11/5 at 0308 which resulted > 200) Daily CBC per protocol while on IV heparin  Benita Gutter 10/27/2021 7:38 AM

## 2021-10-27 NOTE — Progress Notes (Signed)
Notified of critical lab PTT >200. Heparin infusing in right forearm. Pt states lab drew from right hand and gauze/tape noted on hand. Pt assessed, no signs of bleeding anywhere. Heparin paused and RN notified pharmacist of critical result.  Repeat PTT ordered. Lab tech in to draw repeat within a few minutes of pausing heparin. RN confirmed lab drawn from opposite arm of heparin.  Heparin remained paused till repeat PTT resulted which was 77. Contacted pharmacist and per pharm restarted heparin at same rate 950 units/hr.

## 2021-10-27 NOTE — Progress Notes (Signed)
Pulmonology Progress Note         Date: 10/27/2021,   MRN# [id number] Howard [name] [birth year]     AdmissionWeight: 88.9 kg                 CurrentWeight: 82.9 kg   Referring physician: Dr Reesa Chew   CHIEF COMPLAINT:   Non-massive hemoptysis   HISTORY OF PRESENT ILLNESS   This is a pleasant 87 yo m with hx of CHF, DM, GERD, HTN, NCLC s/p SBRT, chronic hypoxemia on 3L O2, cAd who came in to ED with acute onset SOB. This happenend at home while at rest suddenly.  He has OSA and was placed on CPAP. He had CTPE done in hospital negative for PE.  His bloodwork showed severely elevated BNP. CT also showed new 7mm RUL nodule. He is being treated for pneumonia empirically and has PCCM consult for abnormal ct chest findings and non - massive hemoptysis.   10/25/21- patient improved hes on 5L/[place].  He is starting PT/OT.  His cough is better. Workup is in process. [place] cardiology consultation NSTEMI workup in progress.   10/26/21- Patient feels slightly improved, he has gotten out of bed across the room but has not had activity thus far. He has PT ordered. He had CXR with improvement on left chest. His O2 has improved with weaning supplemental oxygent to 4L/min [place].  Overall there is little evidence for bacterial pneumonia but I think we should finish empiric antibiotics for total 5 days and continue cardiac intensive medical management.   10/27/21- patient is substantially improved.  He is on room air.  He does have intermittent blood tinged sputum. He is on eliquis and we will need to monitor closely for hemoptyisis.  He does have reduction of H/h on serial CBC.  He is on room air during my evaluation and speaks in full sentences.  He can to incentive spirometry to 550cc.  I reviewed medical plan with wife at bedside. CXR to be repeated in Am with plan to dc home with close follow up on outpatient.    PAST MEDICAL HISTORY   Past Medical History:  Diagnosis Date   Arthritis    Bell  palsy    Bell's palsy 04/12/2015   Cancer [facility])    prostate and skin   Chronic combined systolic and diastolic CHF, NYHA class 1 ([place])    a. 07/2014 Echo: EF 35-40%, Gr 1 DD.   Complete heart block ([place])    a. 11/2010 s/p SJM 2210 [facility] DC PPM, ser# [id number].   Depression    Diabetes mellitus without complication ([place])    Fall 11-10-14   GERD (gastroesophageal reflux disease)    History of prostate cancer    Hyperlipidemia    Hypertension    LBBB (left bundle branch block)    Left-sided Bell's palsy    Lung cancer ([place]) 2016   NICM (nonischemic cardiomyopathy) ([place])    a. 07/2014 Echo: EF 35-40%, mid-apicalanteroseptal DK, Gr 1 DD, mild-mod dil LA.   Non-obstructive CAD    a. 07/2014 Abnl MV;  b. 08/2014 Cath: LM nl, LAD 30p, RI 40p, LCX nl, OM1 40, RCA dominant 30p, 70d-->Med Rx.   Poor balance    Presence of permanent cardiac pacemaker    Sleep apnea    a. cpap   Vertigo    WPW (Wolff-Parkinson-White syndrome)    a. S/P RFCA 1991.     SURGICAL HISTORY   Past Surgical History:  Procedure Laterality Date   ABDOMINAL AORTIC ENDOVASCULAR STENT GRAFT  08/25/2019   Procedure: ABDOMINAL AORTIC ENDOVASCULAR STENT GRAFT;  Surgeon: Algernon Huxley, MD;  Location: [facility] ORS;  Service: Vascular;;   ANGIOPLASTY Left 08/25/2019   Procedure: ANGIOPLASTY;  Surgeon: Algernon Huxley, MD;  Location: [facility] ORS;  Service: Vascular;  Laterality: Left;  left SFA and stent placement   APPLICATION OF WOUND VAC Left 06/07/2015   Procedure: APPLICATION OF WOUND VAC;  Surgeon: Robert Bellow, MD;  Location: [facility] ORS;  Service: General;  Laterality: Left;  left upper back   [place]  08/26/2014   Single vessel obstructive CAD   CARPAL TUNNEL RELEASE  04-04-15   [facility]   CATARACT EXTRACTION  07-31-11 and 09-18-11   Catheter ablation  1991   for WPW   cervical fusion     CHOLECYSTECTOMY  09-07-14   ENDARTERECTOMY FEMORAL Left 08/25/2019   Procedure: ENDARTERECTOMY FEMORAL;   Surgeon: Algernon Huxley, MD;  Location: [facility] ORS;  Service: Vascular;  Laterality: Left;  common and produndis    ENDOVASCULAR REPAIR/STENT GRAFT Right 08/25/2019   Procedure: ENDOVASCULAR REPAIR/STENT GRAFT;  Surgeon: Algernon Huxley, MD;  Location: [facility] ORS;  Service: Vascular;  Laterality: Right;  renal artery   HAND SURGERY     right 1993; left 2005   [place] / REPLACE / REMOVE PACEMAKER     INSERTION OF ILIAC STENT Bilateral 08/25/2019   Procedure: INSERTION OF ILIAC STENT;  Surgeon: Algernon Huxley, MD;  Location: [facility] ORS;  Service: Vascular;  Laterality: Bilateral;   JOINT REPLACEMENT Left 2013   knee   JOINT REPLACEMENT Right 2004   knee   KNEE SURGERY     left knee 1991 and 1992; right knee [place] N/A 08/26/2014   Procedure: LEFT HEART CATHETERIZATION WITH CORONARY ANGIOGRAM;  Surgeon: [name], MD;  Location: [facility] CATH LAB;  Service: Cardiovascular;  Laterality: N/A;   LOWER EXTREMITY ANGIOGRAPHY Left 08/23/2019   Procedure: Lower Extremity Angiography;  Surgeon: Algernon Huxley, MD;  Location: [place] CV LAB;  Service: Cardiovascular;  Laterality: Left;   LUMBAR LAMINECTOMY/DECOMPRESSION MICRODISCECTOMY N/A 06/07/2014   Procedure: LUMBAR FOUR TO FIVE LUMBAR LAMINECTOMY/DECOMPRESSION MICRODISCECTOMY 1 LEVEL;  Surgeon: [name], MD;  Location: [place] NEURO ORS;  Service: Neurosurgery;  Laterality: N/A;   LUNG BIOPSY Right 2016   Dr [name]   MOHS SURGERY     PACEMAKER INSERTION     PPM-- St Jude 11/30/10 by [name]   PPM GENERATOR CHANGEOUT N/A 07/09/2019   Procedure: PPM GENERATOR CHANGEOUT;  Surgeon: Deboraha Sprang, MD;  Location: [place] CV LAB;  Service: Cardiovascular;  Laterality: N/A;   PROSTATE SURGERY     cancer--1998, prostatectomy   REPLACEMENT TOTAL KNEE     2004   RIGHT/LEFT HEART CATH AND CORONARY ANGIOGRAPHY Bilateral 04/13/2021   Procedure: RIGHT/LEFT HEART CATH AND CORONARY ANGIOGRAPHY;  Surgeon:  Wellington Hampshire, MD;  Location: [place] CV LAB;  Service: Cardiovascular;  Laterality: Bilateral;   ruptured disc     1962 and 1998   TEE WITHOUT CARDIOVERSION N/A 09/01/2019   Procedure: TRANSESOPHAGEAL ECHOCARDIOGRAM (TEE);  Surgeon: [name], MD;  Location: [facility] ORS;  Service: Cardiovascular;  Laterality: N/A;   TEMPORARY PACEMAKER N/A 07/09/2019   Procedure: TEMPORARY PACEMAKER;  Surgeon: Deboraha Sprang, MD;  Location: [place]  CV LAB;  Service: Cardiovascular;  Laterality: N/A;   TRIGGER FINGER RELEASE  01-24-15   WOUND DEBRIDEMENT Left 06/07/2015   Procedure: DEBRIDEMENT WOUND;  Surgeon: Robert Bellow, MD;  Location: [facility] ORS;  Service: General;  Laterality: Left;  left upper back     FAMILY HISTORY   Family History  Problem Relation Age of Onset   Heart attack Mother    Hyperlipidemia Mother    CAD Other    Prostate cancer Neg Hx    Cancer Neg Hx      SOCIAL HISTORY   Social History   Tobacco Use   Smoking status: Former    Packs/day: 1.00    Years: 56.00    Pack years: 56.00    Types: Cigarettes    Quit date: 2011    Years since quitting: 11.8   Smokeless tobacco: Never  Vaping Use   Vaping Use: Never used  Substance Use Topics   Alcohol use: No   Drug use: No     MEDICATIONS    Home Medication:  Current Outpatient Rx   Order #: [id number] Class: Normal     Current Medication:  Current Facility-Administered Medications:    0.9 %  sodium chloride infusion, 250 mL, Intravenous, PRN, Lorella Nimrod, MD   acetaminophen (TYLENOL) tablet 650 mg, 650 mg, Oral, Q4H PRN, Lorella Nimrod, MD   apixaban (ELIQUIS) tablet 5 mg, 5 mg, Oral, BID, Agbor-Etang, [name], MD, 5 mg at 10/27/21 1502   aspirin EC tablet 81 mg, 81 mg, Oral, Daily, Amin, Soundra Pilon, MD, 81 mg at 10/27/21 0833   ceFEPIme (MAXIPIME) 2 g in sodium chloride 0.9 % 100 mL IVPB, 2 g, Intravenous, Q12H, [name], RPH, Last Rate: 200 mL/hr at 10/27/21 1248, 2 g at 10/27/21 1248    cholecalciferol (VITAMIN D3) tablet 1,000 Units, 1,000 Units, Oral, Daily, Lorella Nimrod, MD, 1,000 Units at 10/27/21 2725   dapagliflozin propanediol (FARXIGA) tablet 10 mg, 10 mg, Oral, Daily, Amin, Soundra Pilon, MD   donepezil (ARICEPT) tablet 5 mg, 5 mg, Oral, Daily, Amin, Soundra Pilon, MD, 5 mg at 10/27/21 0834   ezetimibe (ZETIA) tablet 10 mg, 10 mg, Oral, Daily, Amin, Soundra Pilon, MD, 10 mg at 10/27/21 0833   fluticasone furoate-vilanterol (BREO ELLIPTA) 100-25 MCG/ACT 1 puff, 1 puff, Inhalation, Daily, 1 puff at 10/27/21 0832 **AND** umeclidinium bromide (INCRUSE ELLIPTA) 62.5 MCG/ACT 1 puff, 1 puff, Inhalation, Daily, Amin, Soundra Pilon, MD, 1 puff at 10/27/21 0832   [START ON 10/28/2021] furosemide (LASIX) tablet 20 mg, 20 mg, Oral, Daily, Agbor-Etang, [name], MD   insulin aspart (novoLOG) injection 0-15 Units, 0-15 Units, Subcutaneous, TID WC, Amin, Soundra Pilon, MD, 3 Units at 10/27/21 1246   insulin aspart (novoLOG) injection 0-5 Units, 0-5 Units, Subcutaneous, QHS, Amin, Sumayya, MD   isosorbide mononitrate (IMDUR) 24 hr tablet 30 mg, 30 mg, Oral, Daily, Amin, Soundra Pilon, MD, 30 mg at 10/27/21 0833   loratadine (CLARITIN) tablet 10 mg, 10 mg, Oral, Daily, Amin, Soundra Pilon, MD, 10 mg at 10/27/21 3664   metoprolol succinate (TOPROL-XL) 24 hr tablet 25 mg, 25 mg, Oral, QHS, Amin, Sumayya, MD, 25 mg at 10/26/21 2254   montelukast (SINGULAIR) tablet 10 mg, 10 mg, Oral, Daily PRN, Lorella Nimrod, MD   multivitamin with minerals tablet 1 tablet, 1 tablet, Oral, Daily, Lorella Nimrod, MD, 1 tablet at 10/27/21 0833   ondansetron (ZOFRAN) injection 4 mg, 4 mg, Intravenous, Q6H PRN, Lorella Nimrod, MD   pantoprazole (PROTONIX) EC tablet 40 mg, 40 mg, Oral, Daily, Lorella Nimrod, MD,  40 mg at 10/27/21 4709   sacubitril-valsartan (ENTRESTO) 49-51 mg per tablet, 1 tablet, Oral, BID, Lorella Nimrod, MD, 1 tablet at 10/27/21 6283   sertraline (ZOLOFT) tablet 50 mg, 50 mg, Oral, QHS, Amin, Soundra Pilon, MD, 50 mg at 10/26/21 2255   simvastatin  (ZOCOR) tablet 40 mg, 40 mg, Oral, QHS, Amin, Soundra Pilon, MD, 40 mg at 10/26/21 2256   sodium bicarbonate tablet 650 mg, 650 mg, Oral, TID, Lorella Nimrod, MD, 650 mg at 10/27/21 6629   sodium chloride flush (NS) 0.9 % injection 3 mL, 3 mL, Intravenous, Q12H, Lorella Nimrod, MD, 3 mL at 10/27/21 0840   sodium chloride flush (NS) 0.9 % injection 3 mL, 3 mL, Intravenous, PRN, Lorella Nimrod, MD   spironolactone (ALDACTONE) tablet 25 mg, 25 mg, Oral, Daily, Lorella Nimrod, MD, 25 mg at 10/27/21 4765    ALLERGIES   Sulfa antibiotics     REVIEW OF SYSTEMS    Review of Systems:  Gen:  Denies  fever, sweats, chills weigh loss  HEENT: Denies blurred vision, double vision, ear pain, eye pain, hearing loss, nose bleeds, sore throat Cardiac:  No dizziness, chest pain or heaviness, chest tightness,edema Resp:   Denies cough or sputum porduction, shortness of breath,wheezing, hemoptysis,  Gi: Denies swallowing difficulty, stomach pain, nausea or vomiting, diarrhea, constipation, bowel incontinence Gu:  Denies bladder incontinence, burning urine Ext:   Denies Joint pain, stiffness or swelling Skin: Denies  skin rash, easy bruising or bleeding or hives Endoc:  Denies polyuria, polydipsia , polyphagia or weight change Psych:   Denies depression, insomnia or hallucinations   Other:  All other systems negative   VS: BP 105/62 (BP Location: Left Arm)   Pulse 95   Temp 98.4 F (36.9 C) (Oral)   Resp 18   Wt 82.9 kg   SpO2 93%   BMI 26.99 kg/m      PHYSICAL EXAM    GENERAL:NAD, no fevers, chills, no weakness no fatigue HEAD: Normocephalic, atraumatic.  EYES: Pupils equal, round, reactive to light. Extraocular muscles intact. No scleral icterus.  MOUTH: Moist mucosal membrane. Dentition intact. No abscess noted.  EAR, NOSE, THROAT: Clear without exudates. No external lesions.  NECK: Supple. No thyromegaly. No nodules. No JVD.  PULMONARY: Diffuse coarse rhonchi right sided  +wheezes CARDIOVASCULAR: S1 and S2. Regular rate and rhythm. No murmurs, rubs, or gallops. No edema. Pedal pulses 2+ bilaterally.  GASTROINTESTINAL: Soft, nontender, nondistended. No masses. Positive bowel sounds. No hepatosplenomegaly.  MUSCULOSKELETAL: No swelling, clubbing, or edema. Range of motion full in all extremities.  NEUROLOGIC: Cranial nerves II through XII are intact. No gross focal neurological deficits. Sensation intact. Reflexes intact.  SKIN: No ulceration, lesions, rashes, or cyanosis. Skin warm and dry. Turgor intact.  PSYCHIATRIC: Mood, affect within normal limits. The patient is awake, alert and oriented x 3. Insight, judgment intact.       IMAGING    CT Angio Chest PE W and/or Wo Contrast  Result Date: 10/24/2021 CLINICAL DATA:  Acute onset shortness of breath. History of lung cancer. EXAM: CT ANGIOGRAPHY CHEST WITH CONTRAST TECHNIQUE: Multidetector CT imaging of the chest was performed using the standard protocol during bolus administration of intravenous contrast. Multiplanar CT image reconstructions and MIPs were obtained to evaluate the vascular anatomy. CONTRAST:  13mL OMNIPAQUE IOHEXOL 350 MG/ML SOLN COMPARISON:  CT chest dated September 12, 2021. FINDINGS: Cardiovascular: Satisfactory opacification of the pulmonary arteries to the segmental level. No evidence of pulmonary embolism. Unchanged mild cardiomegaly. No pericardial effusion. No  thoracic aortic aneurysm. Coronary, aortic arch, and branch vessel atherosclerotic vascular disease. Mediastinum/Nodes: No enlarged mediastinal, hilar, or axillary lymph nodes. Unchanged index subcarinal lymph node measuring 1.1 cm in short axis. Thyroid gland, trachea, and esophagus demonstrate no significant findings. Lungs/Pleura: New patchy ground-glass densities with areas of nodular consolidation in the right upper, right middle, and right lower lobes, with inter- and intralobular septal thickening. Post treatment related dense  fibrotic consolidation bronchiectasis in the medial right upper and lower lobes has progressed. New small right and trace left pleural effusions. No pneumothorax. Unchanged calcified scarring in the lingula and medial right middle lobe. Mild dependent subsegmental atelectasis in the posterior left lower lobe. Mild emphysema. New 9 x 8 mm spiculated density in the right upper lobe (series 6, image 22). Upper Abdomen: No acute abnormality. Musculoskeletal: No chest wall abnormality. No acute or significant osseous findings. Review of the MIP images confirms the above findings. IMPRESSION: 1. No evidence of pulmonary embolism. 2. New patchy ground-glass densities with areas of nodular consolidation in the right upper, right middle, and right lower lobes, with inter- and intralobular septal thickening. Differential considerations include multifocal pneumonia, asymmetric pulmonary edema, and pulmonary hemorrhage. 3. New small right and trace left pleural effusions. 4. New 9 x 8 mm spiculated density in the right upper lobe, presumably related to underlying acute process in the right lung. However, attention on follow-up imaging is recommended. 5. Aortic Atherosclerosis (ICD10-I70.0) and Emphysema (ICD10-J43.9). Electronically Signed   By: Titus Dubin M.D.   On: 10/24/2021 11:14   DG Chest Port 1 View  Result Date: 10/25/2021 CLINICAL DATA:  87 year old male with history of shortness of breath and lung cancer with recent abnormal imaging. EXAM: PORTABLE CHEST 1 VIEW COMPARISON:  Chest CT of October 24, 2021 and chest radiograph of October 24, 2021. FINDINGS: LEFT-sided dual lead pacer device, leads project over the cardiac silhouette and power pack over the LEFT chest as before. EKG leads project over the chest. Cardiomediastinal contours are stable. Increased interstitial and alveolar opacities seen more on the RIGHT than the LEFT have diminished in the short interval. Bandlike area of airspace disease in the  RIGHT mid chest. No pneumothorax. On limited assessment there is no acute skeletal process. IMPRESSION: Increased interstitial and alveolar opacities seen on the RIGHT than the LEFT have diminished in the short interval. Likely reflecting improving edema and or volume overload. Bandlike area of airspace disease in the RIGHT mid chest likely atelectatic changes. Persistent basilar opacities in the RIGHT medial lower chest related to volume loss and post treatment changes, superimposed pneumonia would be difficult to exclude. Electronically Signed   By: Zetta Bills M.D.   On: 10/25/2021 11:32   DG Chest Portable 1 View  Result Date: 10/24/2021 CLINICAL DATA:  Shortness of breath. EXAM: PORTABLE CHEST 1 VIEW COMPARISON:  CT chest dated September 12, 2021. Chest x-ray dated July 03, 2021. FINDINGS: Left chest wall pacemaker again noted. Unchanged mild cardiomegaly. New hazy airspace disease in the right mid and lower lung. Unchanged scarring in the left lung base and post treatment changes in the medial right lower lobe. No pleural effusion or pneumothorax. No acute osseous abnormality. IMPRESSION: 1. New hazy airspace disease in the right mid and lower lung concerning for pneumonia. Electronically Signed   By: Titus Dubin M.D.   On: 10/24/2021 08:54   ECHOCARDIOGRAM COMPLETE  Result Date: 10/25/2021    ECHOCARDIOGRAM REPORT   Patient Name:   FREDICK SCHLOSSER Date of Exam:  10/25/2021 Medical Rec #:  [id number]      Height:       69.0 in Accession #:    [id number]     Weight:       185.2 lb Date of Birth:  1931-04-12       BSA:          2.000 m Patient Age:    48 years       BP:           123/69 mmHg Patient Gender: M              HR:           106 bpm. Exam Location:  [facility] Procedure: 2D Echo, Color Doppler and Cardiac Doppler Indications:     Elevated troponin  History:         Patient has prior history of Echocardiogram examinations, most                  recent 07/24/2021. CHF, CAD, Pacemaker; Risk  Factors:Diabetes,                  Hypertension and Dyslipidemia. Hx of radiation therapy.  Sonographer:     Charmayne Sheer Referring Phys:  [id number] AMIN Diagnosing Phys: [name] MD IMPRESSIONS  1. Left ventricular ejection fraction, by estimation, is 20 to 25%. The left ventricle has severely decreased function. The left ventricle demonstrates global hypokinesis. There is mild left ventricular hypertrophy. Left ventricular diastolic parameters  are consistent with Grade II diastolic dysfunction (pseudonormalization).  2. Right ventricular systolic function is normal. The right ventricular size is normal. Tricuspid regurgitation signal is inadequate for assessing PA pressure.  3. Left atrial size was moderately dilated.  4. The mitral valve is normal in structure. Mild to moderate mitral valve regurgitation. No evidence of mitral stenosis.  5. The aortic valve was not well visualized. Aortic valve regurgitation is not visualized. Mild to moderate aortic valve sclerosis/calcification is present, without any evidence of aortic stenosis.  6. There is borderline dilatation of the aortic root, measuring 37 mm.  7. The inferior vena cava is normal in size with greater than 50% respiratory variability, suggesting right atrial pressure of 3 mmHg. FINDINGS  Left Ventricle: Left ventricular ejection fraction, by estimation, is 20 to 25%. The left ventricle has severely decreased function. The left ventricle demonstrates global hypokinesis. The left ventricular internal cavity size was normal in size. There is mild left ventricular hypertrophy. Left ventricular diastolic parameters are consistent with Grade II diastolic dysfunction (pseudonormalization). Right Ventricle: The right ventricular size is normal. No increase in right ventricular wall thickness. Right ventricular systolic function is normal. Tricuspid regurgitation signal is inadequate for assessing PA pressure. Left Atrium: Left atrial size was  moderately dilated. Right Atrium: Right atrial size was normal in size. Pericardium: There is no evidence of pericardial effusion. Mitral Valve: The mitral valve is normal in structure. There is mild thickening of the mitral valve leaflet(s). Mild to moderate mitral valve regurgitation. No evidence of mitral valve stenosis. MV peak gradient, 11.8 mmHg. The mean mitral valve gradient  is 4.0 mmHg. Tricuspid Valve: The tricuspid valve is normal in structure. Tricuspid valve regurgitation is not demonstrated. No evidence of tricuspid stenosis. Aortic Valve: The aortic valve was not well visualized. Aortic valve regurgitation is not visualized. Mild to moderate aortic valve sclerosis/calcification is present, without any evidence of aortic stenosis. Aortic valve mean gradient measures 2.0 mmHg.  Aortic valve peak gradient measures  4.8 mmHg. Aortic valve area, by VTI measures 2.19 cm. Pulmonic Valve: The pulmonic valve was normal in structure. Pulmonic valve regurgitation is not visualized. No evidence of pulmonic stenosis. Aorta: The aortic root is normal in size and structure. There is borderline dilatation of the aortic root, measuring 37 mm. Venous: The inferior vena cava is normal in size with greater than 50% respiratory variability, suggesting right atrial pressure of 3 mmHg. IAS/Shunts: No atrial level shunt detected by color flow Doppler. Additional Comments: A device lead is visualized.  LEFT VENTRICLE PLAX 2D LVIDd:         4.70 cm   Diastology LVIDs:         3.46 cm   LV e' medial:    7.29 cm/s LV PW:         1.09 cm   LV E/e' medial:  20.3 LV IVS:        0.89 cm   LV e' lateral:   12.40 cm/s LVOT diam:     2.40 cm   LV E/e' lateral: 11.9 LV SV:         32 LV SV Index:   16 LVOT Area:     4.52 cm  LEFT ATRIUM             Index LA diam:        5.50 cm 2.75 cm/m LA Vol (A2C):   66.1 ml 33.06 ml/m LA Vol (A4C):   41.0 ml 20.50 ml/m LA Biplane Vol: 52.1 ml 26.06 ml/m  AORTIC VALVE                     PULMONIC VALVE AV Area (Vmax):    2.14 cm     PV Vmax:       0.67 m/s AV Area (Vmean):   2.22 cm     PV Vmean:      49.300 cm/s AV Area (VTI):     2.19 cm     PV VTI:        0.094 m AV Vmax:           110.00 cm/s  PV Peak grad:  1.8 mmHg AV Vmean:          65.100 cm/s  PV Mean grad:  1.0 mmHg AV VTI:            0.144 m AV Peak Grad:      4.8 mmHg AV Mean Grad:      2.0 mmHg LVOT Vmax:         52.00 cm/s LVOT Vmean:        31.900 cm/s LVOT VTI:          0.070 m LVOT/AV VTI ratio: 0.48  AORTA Ao Root diam: 3.70 cm MITRAL VALVE MV Area (PHT): 5.58 cm     SHUNTS MV Area VTI:   1.06 cm     Systemic VTI:  0.07 m MV Peak grad:  11.8 mmHg    Systemic Diam: 2.40 cm MV Mean grad:  4.0 mmHg MV Vmax:       1.72 m/s MV Vmean:      89.6 cm/s MV Decel Time: 136 msec MV E velocity: 148.00 cm/s MV A velocity: 51.40 cm/s MV E/A ratio:  2.88 [name] MD Electronically signed by [name] MD Signature Date/Time: 10/25/2021/5:46:45 PM    Final    CUP PACEART REMOTE DEVICE CHECK  Result Date: 10/09/2021 Scheduled remote reviewed. Normal device function.  14 AMS, <  10sec PAT 1 PMT appropriately treated without termination Next remote 91 days. LR     ASSESSMENT/PLAN   Acute hypoxemic respiratory failure - present on admission  - COVID19 negative -there is acute on chornic CHF but infiltrate is almost exclusively on right lung which suggests pneumonia as primary inciting event.   -Procalcitonin is normal and there is no leukocytosis or fevers to suggest active infection - supplemental O2 during my evaluation BIPAP 100% - infectious workup for pneumonia as below: -Respiratory viral panel -serum fungitell -legionella ab -strep pneumoniae ur AG -Histoplasma Ur Ag -sputum resp cultures -AFB sputum expectorated specimen -sputum cytology  -reviewed pertinent imaging with patient today - CRP -PT/OT for d/c planning  -please encourage patient to use incentive spirometer few times each hour while  hospitalized.   -ABG -appreciate cardiology consultation input  -some concern regarding possibility of alveolar hemmorage as etiology of current pneumonia due to normal WBC count and procalcitonin with reduction in h/h and hemoptysis.   Right upper lobe lung cancer  S/p SBRT    NSTEMI  - medical management at this time  - cardiology on case appreciate input   - no contraindications to anticoagulation in context of hemoptysis from pulmonary perspective. OK to have antiplatelet agents.    Thank you for allowing me to participate in the care of this patient.    Patient/Family are satisfied with care plan and all questions have been answered.  This document was prepared using Dragon voice recognition software and may include unintentional dictation errors.     Ottie Glazier, M.D.  Division of [place]

## 2021-10-27 NOTE — Progress Notes (Signed)
Physical Therapy Treatment Patient Details Name: Howard [name] MRN: [id number] DOB: [birth year] Today's Date: 10/27/2021   History of Present Illness Howard [name] is a 87 y.o. male with medical history significant of HFrEF, diabetes, gastric reflux, hypertension, hyperlipidemia, non-small cell lung cancer s/p radiation, chronic respiratory failure with emphysema, on 3 L of oxygen at home, CAD, presents to the emergency department with complaint of sudden onset shortness of breath.    PT Comments    Pt received supine in bed, agreeable to therapy. Wife in room. Pt performed bed mobility with SUP utilizing bed rail (as he would use night stand at home), STS and 38ft of ambulation with CGA, both with RW. CGA for occasional steadying and pt impulsive decisions. Pt then performed an ambulatory toilet transfer, VC to remain within RW throughout bathroom mobility. PT managed IV and O2 throughout session. Wife expressed concern over pt d/c home - PT provided extensive education on d/c recs, why these recs have been chosen, safety precautions with mobility once home, suggestions with sleeping, bathroom transfers. Encouraged pt and wife collaborate and respect each others concerns regardin gpt mobility, i.e. wife is concerned pt will not be able to get out of bed - PT rec pt sleep in recliner for 1-2 nights until son has a day off work and can come by the house in the morning in case pt has difficulty getting out of bed. PT also rec pt and wife think about long-term caregiving if wife continues to have concerns and is not able to provide assist herself. PT has changed d/c recs from OPPT to [place]. Would benefit from skilled PT to address above deficits and promote optimal return to PLOF.    Recommendations for follow up therapy are one component of a multi-disciplinary discharge planning process, led by the attending physician.  Recommendations may be updated based on patient status, additional functional criteria  and insurance authorization.  Follow Up Recommendations  Home health PT     Assistance Recommended at Discharge Intermittent Supervision/Assistance  Equipment Recommendations  None recommended by PT    Recommendations for Other Services       Precautions / Restrictions Precautions Precautions: Fall Restrictions Weight Bearing Restrictions: No     Mobility  Bed Mobility Overal bed mobility: Needs Assistance Bed Mobility: Supine to Sit;Sit to Supine     Supine to sit: Supervision;HOB elevated Sit to supine: Supervision   General bed mobility comments: increased time. pt utilized bed rail as he would use night stand at home.    Transfers Overall transfer level: Needs assistance Equipment used: Rolling walker (2 wheels) Transfers: Sit to/from Stand Sit to Stand: Min guard           General transfer comment: CGA for mild steadying    Ambulation/Gait Ambulation/Gait assistance: Min guard Gait Distance (Feet): 40 Feet Assistive device: Rolling walker (2 wheels) Gait Pattern/deviations: Step-through pattern;WFL(Within Functional Limits);Trunk flexed     General Gait Details: impulsive with mobility requiring VC for safety   Stairs             Wheelchair Mobility    Modified Rankin (Stroke Patients Only)       Balance Overall balance assessment: History of Falls;Needs assistance Sitting-balance support: Feet supported;No upper extremity supported Sitting balance-Leahy Scale: Good     Standing balance support: Bilateral upper extremity supported;During functional activity Standing balance-Leahy Scale: Fair Standing balance comment: dependent on UE support during ambulation, he is able to manage pants in static standing without UE  support                            Cognition Arousal/Alertness: Awake/alert Behavior During Therapy: WFL for tasks assessed/performed Overall Cognitive Status: Within Functional Limits for tasks assessed                                           Exercises Other Exercises Other Exercises: PT provided education on d/c recs, why these recs have been chosen, safety precautions with mobility once home, suggestions with sleeping, bathroom transfers. Encouraged pt and wife collaborate and respect each others concerns regardin gpt mobility, i.e. wife is concerned pt will not be able to get out of bed - PT rec pt sleep in recliner for 1-2 nights until son has a day off work and can come by the house in the morning in case pt has difficulty getting out of bed. PT also rec pt and wife think about long-term caregiving if wife continues to have concerns - PT suggested caregivers and ALF.    General Comments        Pertinent Vitals/Pain Pain Assessment: No/denies pain    Home Living                          Prior Function            PT Goals (current goals can now be found in the care plan section) Acute Rehab PT Goals Patient Stated Goal: to return home PT Goal Formulation: With patient/family Time For Goal Achievement: 11/01/21 Potential to Achieve Goals: Good    Frequency    Min 2X/week      PT Plan      Co-evaluation              AM-PAC PT "6 Clicks" Mobility   Outcome Measure  Help needed turning from your back to your side while in a flat bed without using bedrails?: A Little Help needed moving from lying on your back to sitting on the side of a flat bed without using bedrails?: A Little Help needed moving to and from a bed to a chair (including a wheelchair)?: A Little Help needed standing up from a chair using your arms (e.g., wheelchair or bedside chair)?: A Little Help needed to walk in hospital room?: A Little Help needed climbing 3-5 steps with a railing? : A Little 6 Click Score: 18    End of Session Equipment Utilized During Treatment: Gait belt;Oxygen Activity Tolerance: Patient tolerated treatment well Patient left: with call  bell/phone within reach;with family/visitor present;in bed;with bed alarm set Nurse Communication: Mobility status PT Visit Diagnosis: Unsteadiness on feet (R26.81);History of falling (Z91.81);Other abnormalities of gait and mobility (R26.89)     Time: 1340-1420 PT Time Calculation (min) (ACUTE ONLY): 40 min  Charges:  $Gait Training: 8-22 mins $Therapeutic Activity: 23-37 mins                      Patrina Levering PT, DPT 10/27/21 2:44 PM [phone]

## 2021-10-27 NOTE — Progress Notes (Signed)
Progress Note  Patient Name: Howard [name] Date of Encounter: 10/27/2021  [facility] Cardiologist: [name], MD   Subjective   I/Os do not appear complete. Appears to have minimal UOP in the room. Patient says he feels "swimmy headed". He is on 4L O2 (3L O2 at home). No chest pain.   Inpatient Medications    Scheduled Meds:  aspirin EC  81 mg Oral Daily   cholecalciferol  1,000 Units Oral Daily   dapagliflozin propanediol  10 mg Oral Daily   donepezil  5 mg Oral Daily   ezetimibe  10 mg Oral Daily   fluticasone furoate-vilanterol  1 puff Inhalation Daily   And   umeclidinium bromide  1 puff Inhalation Daily   furosemide  40 mg Intravenous Daily   insulin aspart  0-15 Units Subcutaneous TID WC   insulin aspart  0-5 Units Subcutaneous QHS   isosorbide mononitrate  30 mg Oral Daily   loratadine  10 mg Oral Daily   metoprolol succinate  25 mg Oral QHS   multivitamin with minerals  1 tablet Oral Daily   pantoprazole  40 mg Oral Daily   sacubitril-valsartan  1 tablet Oral BID   sertraline  50 mg Oral QHS   simvastatin  40 mg Oral QHS   sodium bicarbonate  650 mg Oral TID   sodium chloride flush  3 mL Intravenous Q12H   spironolactone  25 mg Oral Daily   Continuous Infusions:  sodium chloride     ceFEPime (MAXIPIME) IV Stopped (10/27/21 0026)   heparin 950 Units/hr (10/27/21 0734)   PRN Meds: sodium chloride, acetaminophen, montelukast, ondansetron (ZOFRAN) IV, sodium chloride flush   Vital Signs    Vitals:   10/26/21 2250 10/27/21 0017 10/27/21 0459 10/27/21 0729  BP: 124/67 120/77 139/84 (!) 141/79  Pulse:  86 (!) 104 86  Resp:  18 17 18   Temp:  97.9 F (36.6 C) 98.4 F (36.9 C) 97.9 F (36.6 C)  TempSrc:   Oral   SpO2:  96% 93% 96%  Weight:   82.9 kg     Intake/Output Summary (Last 24 hours) at 10/27/2021 0941 Last data filed at 10/27/2021 0841 Gross per 24 hour  Intake 445.67 ml  Output 550 ml  Net -104.33 ml   Last 3 Weights 10/27/2021  10/26/2021 10/25/2021  Weight (lbs) 182 lb 12.8 oz 181 lb 1.6 oz 185 lb 3 oz  Weight (kg) 82.918 kg 82.146 kg 84 kg      Telemetry    Asensed V paced rhythm, PVC, HR 80-110  - Personally Reviewed  ECG    No new - Personally Reviewed  Physical Exam   GEN: No acute distress.   Neck: No JVD Cardiac: RRR, no murmurs, rubs, or gallops.  Respiratory: course breath sounds, crackles. GI: Soft, nontender, non-distended  MS: No edema; No deformity. Neuro:  Nonfocal  Psych: Normal affect   Labs    High Sensitivity Troponin:   Recent Labs  Lab 10/24/21 0827 10/24/21 1036 10/24/21 1728 10/25/21 0902 10/25/21 1033  TROPONINIHS 70* 180* 12,859*  2,390* 10,829* 10,263*     Chemistry Recent Labs  Lab 10/24/21 0827 10/25/21 0446 10/26/21 0434 10/27/21 0308  NA 139 139 137 133*  K 4.1 3.9 3.9 3.5  CL 110 111 111 104  CO2 18* 20* 15* 19*  GLUCOSE 241* 160* 173* 297*  BUN 25* 25* 30* 32*  CREATININE 1.27* 1.11 1.18 1.10  CALCIUM 9.1 9.0 9.0 8.7*  PROT  7.2  --   --   --   ALBUMIN 3.8  --   --   --   AST 21  --   --   --   ALT 15  --   --   --   ALKPHOS 49  --   --   --   BILITOT 0.8  --   --   --   GFRNONAA 54* >60 59* >60  ANIONGAP 11 8 11 10     Lipids No results for input(s): CHOL, TRIG, HDL, LABVLDL, LDLCALC, CHOLHDL in the last 168 hours.  Hematology Recent Labs  Lab 10/25/21 0902 10/26/21 0434 10/27/21 0308  WBC 7.2 6.7 4.9  RBC 3.97* 3.73* 3.55*  HGB 11.1* 10.2* 9.7*  HCT 33.2* 31.8* 29.6*  MCV 83.6 85.3 83.4  MCH 28.0 27.3 27.3  MCHC 33.4 32.1 32.8  RDW 18.4* 18.3* 17.7*  PLT 208 191 190   Thyroid No results for input(s): TSH, FREET4 in the last 168 hours.  BNP Recent Labs  Lab 10/24/21 0827  BNP 4,227.2*    DDimer No results for input(s): DDIMER in the last 168 hours.   Radiology    DG Chest Port 1 View  Result Date: 10/25/2021 CLINICAL DATA:  87 year old male with history of shortness of breath and lung cancer with recent abnormal imaging.  EXAM: PORTABLE CHEST 1 VIEW COMPARISON:  Chest CT of October 24, 2021 and chest radiograph of October 24, 2021. FINDINGS: LEFT-sided dual lead pacer device, leads project over the cardiac silhouette and power pack over the LEFT chest as before. EKG leads project over the chest. Cardiomediastinal contours are stable. Increased interstitial and alveolar opacities seen more on the RIGHT than the LEFT have diminished in the short interval. Bandlike area of airspace disease in the RIGHT mid chest. No pneumothorax. On limited assessment there is no acute skeletal process. IMPRESSION: Increased interstitial and alveolar opacities seen on the RIGHT than the LEFT have diminished in the short interval. Likely reflecting improving edema and or volume overload. Bandlike area of airspace disease in the RIGHT mid chest likely atelectatic changes. Persistent basilar opacities in the RIGHT medial lower chest related to volume loss and post treatment changes, superimposed pneumonia would be difficult to exclude. Electronically Signed   By: Zetta Bills M.D.   On: 10/25/2021 11:32    Cardiac Studies   2D echo 10/25/2021: Pending __________   2D echo 07/24/2021:  1. Left ventricular ejection fraction, by estimation, is 20 to 25%. The  left ventricle has severely decreased function. The left ventricle  demonstrates global hypokinesis. There is moderate left ventricular  hypertrophy. Left ventricular diastolic  parameters are indeterminate.   2. Right ventricular systolic function is normal. The right ventricular  size is normal. Tricuspid regurgitation signal is inadequate for assessing  PA pressure.   3. Left atrial size was mildly dilated.   4. The mitral valve is degenerative. Mild mitral valve regurgitation.   5. The aortic valve is tricuspid. There is moderate calcification of the  aortic valve. There is moderate thickening of the aortic valve. Aortic  valve regurgitation is not visualized. Mild to moderate  aortic valve  sclerosis/calcification is present,  without any evidence of aortic stenosis. __________   [facility] 04/13/2021: Prox RCA lesion is 30% stenosed. Dist RCA lesion is 50% stenosed. Prox LAD to Mid LAD lesion is 20% stenosed. Ost Cx to Prox Cx lesion is 30% stenosed. 1st Mrg lesion is 40% stenosed. 2nd Diag lesion is 60%  stenosed.   1.  Mild to moderate three-vessel coronary artery disease with severe calcifications.  No obstructive lesions. 2.  Left ventricular angiography was not performed.  EF was severely reduced by echo. 3.  Right heart catheterization showed minimally elevated filling pressures, mild pulmonary hypertension and normal cardiac output.   Recommendations: The patient has nonischemic cardiomyopathy. Continue aggressive medical therapy. Volume status appears good. __________   2D echo 04/05/2021: 1. Left ventricular ejection fraction, by estimation, is 20 to 25%. The  left ventricle has severely decreased function. The left ventricle  demonstrates global hypokinesis. There is mild asymmetric left ventricular  hypertrophy. Left ventricular diastolic   parameters are consistent with Grade II diastolic dysfunction  (pseudonormalization). The average left ventricular global longitudinal  strain is -4.6 %. The global longitudinal strain is abnormal.   2. Right ventricular systolic function is moderately reduced. The right  ventricular size is normal.   3. The mitral valve is degenerative. Mild mitral valve regurgitation.   4. The aortic valve is tricuspid. Aortic valve regurgitation is not  visualized. Mild to moderate aortic valve sclerosis/calcification is  present, without any evidence of aortic stenosis.   5. The inferior vena cava is dilated in size with <50% respiratory  variability, suggesting right atrial pressure of 15 mmHg.  Patient Profile     87 y.o. male with history of nonobstructive CAD by LHC in 03/2021, HFrEF secondary to NICM, complete heart  block status post PPM, lung cancer s/p radiation, chronic hypoxic respiratory failure on supplemental oxygen at 3 L at baseline, splenic infarct on chronic OAC, AAA s/p engovascular grafting, PAD s/p multiple interventions, DM2, HTN, HLD, Bell's palsy, prostate cancer, vertigo, and OSA on CPAP who we are seeing for NSTEMI in the context of PNA.   Assessment & Plan    Multivessel nonobstructive CAD with NSTEMI - no chest pain reported - in the setting of PNA with acute on chronic hypoxic respiratory failure requiring BIPAP with volume overload - HS trop elevated. Eliquis held and plan for 48 hours IV heparin, can stop 11/5 - last cath 03/2021 with mild to mod 3V CAD. no plan for re-cath per prior discussion - continue medical management with Aspirin, Zetia, Imdur, Toprol  HFrEF NICM - EF 20- 25% in the past. Echo this admission showed LVEF 20-25%, mild LVH, G2DD, moderately dilated LA - BNP >4000 - IV Lasix 40mg  daily - continue PTA Toprol, Entresto, spironolactone - Farxiga added - I/Os not recorded - kidney function stable - continue with diuresis  Acute on chronic hypoxic respiratory failure secondary to PNA - off Bipap - supplemental O2 at 4L - per IM/pulmonology  Complete heart block s/p PPM - followed by EP  H/o splenic infarct - PTA Eliquis held and was on heparin drip x 48 hours - resume Eliquis when able  For questions or updates, please contact [facility] Please consult [email or web address] for contact info under        Signed, Rasheem Figiel Ninfa Meeker, PA-C  10/27/2021, 9:41 AM

## 2021-10-28 ENCOUNTER — Inpatient Hospital Stay: Payer: Medicare Other

## 2021-10-28 DIAGNOSIS — R9389 Abnormal findings on diagnostic imaging of other specified body structures: Secondary | ICD-10-CM

## 2021-10-28 DIAGNOSIS — I509 Heart failure, unspecified: Secondary | ICD-10-CM | POA: Diagnosis not present

## 2021-10-28 DIAGNOSIS — J9601 Acute respiratory failure with hypoxia: Secondary | ICD-10-CM | POA: Diagnosis not present

## 2021-10-28 DIAGNOSIS — R918 Other nonspecific abnormal finding of lung field: Secondary | ICD-10-CM | POA: Diagnosis not present

## 2021-10-28 DIAGNOSIS — I5023 Acute on chronic systolic (congestive) heart failure: Secondary | ICD-10-CM | POA: Diagnosis not present

## 2021-10-28 LAB — BASIC METABOLIC PANEL
Anion gap: 5 (ref 5–15)
BUN: 38 mg/dL — ABNORMAL HIGH (ref 8–23)
CO2: 24 mmol/L (ref 22–32)
Calcium: 9.1 mg/dL (ref 8.9–10.3)
Chloride: 108 mmol/L (ref 98–111)
Creatinine, Ser: 1.33 mg/dL — ABNORMAL HIGH (ref 0.61–1.24)
GFR, Estimated: 51 mL/min — ABNORMAL LOW (ref >60)
Glucose, Bld: 176 mg/dL — ABNORMAL HIGH (ref 70–99)
Potassium: 4.1 mmol/L (ref 3.5–5.1)
Sodium: 137 mmol/L (ref 135–145)

## 2021-10-28 LAB — CBC WITH DIFFERENTIAL/PLATELET
Abs Immature Granulocytes: 0.02 10*3/uL (ref 0.00–0.07)
Basophils Absolute: 0 10*3/uL (ref 0.0–0.1)
Basophils Relative: 1 %
Eosinophils Absolute: 0.3 10*3/uL (ref 0.0–0.5)
Eosinophils Relative: 6 %
HCT: 31.6 % — ABNORMAL LOW (ref 39.0–52.0)
Hemoglobin: 10.5 g/dL — ABNORMAL LOW (ref 13.0–17.0)
Immature Granulocytes: 0 %
Lymphocytes Relative: 15 %
Lymphs Abs: 0.8 10*3/uL (ref 0.7–4.0)
MCH: 27.5 pg (ref 26.0–34.0)
MCHC: 33.2 g/dL (ref 30.0–36.0)
MCV: 82.7 fL (ref 80.0–100.0)
Monocytes Absolute: 0.6 10*3/uL (ref 0.1–1.0)
Monocytes Relative: 11 %
Neutro Abs: 3.6 10*3/uL (ref 1.7–7.7)
Neutrophils Relative %: 67 %
Platelets: 224 10*3/uL (ref 150–400)
RBC: 3.82 MIL/uL — ABNORMAL LOW (ref 4.22–5.81)
RDW: 17.5 % — ABNORMAL HIGH (ref 11.5–15.5)
WBC: 5.4 10*3/uL (ref 4.0–10.5)
nRBC: 0 % (ref 0.0–0.2)

## 2021-10-28 LAB — GLUCOSE, CAPILLARY
Glucose-Capillary: 164 mg/dL — ABNORMAL HIGH (ref 70–99)
Glucose-Capillary: 131 mg/dL — ABNORMAL HIGH (ref 70–99)

## 2021-10-28 MED ORDER — FUROSEMIDE 10 MG/ML IJ SOLN
40.0000 mg | Freq: Once | INTRAMUSCULAR | Status: AC
  Administered 2021-10-28: 40 mg via INTRAVENOUS
  Filled 2021-10-28: qty 4

## 2021-10-28 MED ORDER — FUROSEMIDE 40 MG PO TABS: 40.0000 mg | ORAL_TABLET | Freq: Every day | ORAL | Status: DC

## 2021-10-28 MED ORDER — DAPAGLIFLOZIN PROPANEDIOL 10 MG PO TABS
10.0000 mg | ORAL_TABLET | Freq: Every day | ORAL | Status: DC
  Administered 2021-10-28: 10 mg via ORAL
  Filled 2021-10-28: qty 1

## 2021-10-28 MED ORDER — SODIUM BICARBONATE 650 MG PO TABS
650.0000 mg | ORAL_TABLET | Freq: Every day | ORAL | Status: DC
  Administered 2021-10-28: 650 mg via ORAL

## 2021-10-28 NOTE — TOC Progression Note (Addendum)
Transition of Care [facility]) - Progression Note    Patient Details  Name: Howard [name] MRN: [id number] Date of Birth: Sep 22, 1931  [facility] CM/SW [place], LCSW Phone Number: 10/28/2021, 9:35 AM  Clinical Narrative:   Spoke to patient's wife [name] regarding rec for HHPT. [name] stated they are very interested in [facility] as they think it will be better for patient and wife than OPPT that he was doing due to transportation difficulty. Patient had HH in the past, [name] was unsure of agency used and denied agency preference. Referral made to [facility] with [facility]. Provided TOC # to [name] per her request if any additional questions arise.     Expected Discharge Plan: Home/Self Care Barriers to Discharge: Continued Medical Work up  Expected Discharge Plan and Services Expected Discharge Plan: Home/Self Care In-house Referral: Clinical Social Work   Post Acute Care Choice: NA                     DME Agency: NA                   Social Determinants of Health (SDOH) Interventions    Readmission Risk Interventions Readmission Risk Prevention Plan 10/26/2021 09/02/2019 08/28/2019  Transportation Screening Complete Complete Complete  PCP or Specialist Appt within 5-7 Days - - Complete  PCP or Specialist Appt within 3-5 Days Complete Complete -  Home Care Screening - - Complete  Medication Review (RN CM) - - Complete  HRI or Home Care Consult Complete Complete -  Social Work Consult for [place] Planning/Counseling Complete Complete -  Palliative Care Screening Not Applicable Not Applicable -  Medication Review Press photographer) Complete Complete -  Some recent data might be hidden

## 2021-10-28 NOTE — Discharge Summary (Signed)
Physician Discharge Summary  Howard [name]:[id number] DOB: March 22, 1931 DOA: 10/24/2021  PCP: Jerrol Banana., MD  Admit date: 10/24/2021 Discharge date: 10/28/2021  Admitted From: Home Disposition: Home  Recommendations for Outpatient Follow-up:  Follow up with PCP in 1-2 weeks Follow-up with cardiology Follow-up with oncology Please obtain BMP/CBC in one week Please follow up on the following pending results: [place]: Yes Equipment/Devices: Home oxygen, rolling walker Discharge Condition: Stable but guarded CODE STATUS: Full Diet recommendation: Heart Healthy / Carb Modified   Brief/Interim Summary: Howard [name] is a 87 y.o. male with medical history significant of HFrEF, diabetes, gastric reflux, hypertension, hyperlipidemia, non-small cell lung cancer s/p radiation, chronic respiratory failure with emphysema, on 3 L of oxygen at home, CAD, presents to the emergency department with complaint of sudden onset shortness of breath and coughing spell where he did spit out mild blood-tinged thick sputum.  Patient completed his radiation treatment for lung cancer approximately 4 months ago. BNP markedly elevated at 4227, COVID-19 negative, procalcitonin negative, elevated lactic acid. CTA negative for PE but did show postradiation changes on right, and some new infiltrates.  Procalcitonin negative, troponin markedly elevated and peaked at 12 859. Cardiology and pulmonology both were consulted.  He was admitted for acute on chronic hypoxic respiratory failure, initially requiring BiPAP and then later transitioned and wean back to baseline oxygen requirement of 3 L.  Procalcitonin remain negative but pulmonology recommend to continue with cefepime while in the hospital.  He might have a mucous plugging which was removed with bouts of coughing.  Cough has been improved.  Legionella and strep pneumo negative.  There was some concern of postradiation pneumonitis per CRP was  not supporting.  Fungitell labs are still pending. Patient appears to be at his baseline now.  Patient has an history of advance HFrEF with history of CAD and now concern for NSTEMI.  Troponin peaked at 12 859 and started trending down.  Cardiology was consulted and they are recommending medical management only.  Repeat echocardiogram with EF of 20% and global hypokinesia.  He was diuresed with IV Lasix while in the hospital and will continue his current management and follow-up with his cardiologist closely for further recommendations.  Patient also developed non-anion gap metabolic acidosis, initially some lactic acidosis which was resolved.  Bicarb within normal limit.  He did receive some p.o. bicarb while in the hospital.  No need to continue at this time.  Patient has an history of non-small cell lung cancer s/p radiation.  He will need to follow-up with his oncologist for further recommendations.  There was some concern of depression/underlying dementia.  He was recently started on Zoloft which he has not started before this hospitalization.  We started him on Zoloft and he will continue with Aricept.  Patient is very high risk for deterioration and even death based on his age and underlying comorbidities.  Had another discussion with him and wife.  Looks like he is still not ready to give up or change the CODE STATUS, full code is not appropriate for him as it will result in more harm than benefit.  He was provided with outpatient palliative care referral to discuss goals of care and symptom management.  Patient will continue with rest of his home medications and follow-up with his providers.  Discharge Diagnoses:  Active Problems:   Acute on chronic HFrEF (heart failure with reduced ejection fraction) (HCC)   Hypoxia   Abnormal CXR  Pulmonary infiltrate in right lung on CXR   Discharge Instructions  Discharge Instructions     (HEART FAILURE PATIENTS) Call MD:  Anytime you have  any of the following symptoms: 1) 3 pound weight gain in 24 hours or 5 pounds in 1 week 2) shortness of breath, with or without a dry hacking cough 3) swelling in the hands, feet or stomach 4) if you have to sleep on extra pillows at night in order to breathe.   Complete by: As directed    Diet - low sodium heart healthy   Complete by: As directed    Discharge instructions   Complete by: As directed    It was pleasure taking care of you. Continue taking your medications as you are taking it before and follow-up with your cardiologist closely for further recommendations.   Increase activity slowly   Complete by: As directed       Allergies as of 10/28/2021       Reactions   Sulfa Antibiotics Rash        Medication List     STOP taking these medications    promethazine-dextromethorphan 6.25-15 MG/5ML syrup Commonly known as: PROMETHAZINE-DM       TAKE these medications    acetaminophen 500 MG tablet Commonly known as: TYLENOL Take 1,000 mg by mouth every 6 (six) hours as needed for moderate pain.   albuterol 108 (90 Base) MCG/ACT inhaler Commonly known as: VENTOLIN HFA INHALE 2 PUFFS INTO LUNGS EVERY 6 HOURS AS NEEDED FOR WHEEZING OR SHORTNESS OF BREATH   aspirin EC 81 MG tablet Take 81 mg by mouth daily.   cetirizine 10 MG tablet Commonly known as: ZYRTEC Take 10 mg by mouth daily.   donepezil 5 MG tablet Commonly known as: ARICEPT Take 1 tablet (5 mg total) by mouth daily.   Eliquis 5 MG Tabs tablet Generic drug: apixaban TAKE 1 TABLET TWICE A DAY  (SWITCHED FROM PLAVIX) What changed: See the new instructions.   Entresto 49-51 MG Generic drug: sacubitril-valsartan Take 1 tablet by mouth 2 (two) times daily.   ezetimibe 10 MG tablet Commonly known as: ZETIA TAKE 1 TABLET DAILY   furosemide 20 MG tablet Commonly known as: LASIX TAKE 1 TABLET BY MOUTH ONCE DAILY   isosorbide mononitrate 30 MG 24 hr tablet Commonly known as: IMDUR TAKE 1 TABLET  DAILY   megestrol 40 MG tablet Commonly known as: MEGACE Take 1 tablet (40 mg total) by mouth 2 (two) times daily.   metFORMIN 500 MG tablet Commonly known as: GLUCOPHAGE Take 1 tablet (500 mg total) by mouth 2 (two) times daily with a meal.   metoprolol succinate 25 MG 24 hr tablet Commonly known as: TOPROL-XL TAKE 1 TABLET AT BEDTIME   montelukast 10 MG tablet Commonly known as: SINGULAIR Take 10 mg by mouth daily as needed (sneezing/allergies.).   multivitamin with minerals Tabs tablet Take 1 tablet by mouth daily. One-A-Day Multivitamin   omeprazole 40 MG capsule Commonly known as: PRILOSEC TAKE 1 CAPSULE TWICE DAILY AS NEEDED   sertraline 50 MG tablet Commonly known as: ZOLOFT Take 1 tablet (50 mg total) by mouth daily.   simvastatin 40 MG tablet Commonly known as: ZOCOR TAKE 1 TABLET AT BEDTIME   spironolactone 25 MG tablet Commonly known as: ALDACTONE Take 1 tablet (25 mg total) by mouth daily.   Trelegy Ellipta 100-62.5-25 MCG/ACT Aepb Generic drug: Fluticasone-Umeclidin-Vilant Inhale 1 puff into the lungs daily.   Vitamin D3 25 MCG (1000 UT)  Caps Take 1,000 Units by mouth daily.   Vitron-C 65-125 MG Tabs Generic drug: Iron-Vitamin C Take 1 tablet by mouth daily.               Durable Medical Equipment  (From admission, onward)           Start     Ordered   10/28/21 1145  For home use only DME Access ramp  Once        10/28/21 1145            Follow-up Information     Jerrol Banana., MD. Schedule an appointment as soon as possible for a visit.   Specialty: Family Medicine Contact information: [address] [place] [address] [phone]         [name], MD .   Specialty: Cardiology Contact information: [place] [address] [phone]         Deboraha Sprang, MD .   Specialty: Cardiology Contact information: [place] [phone]                Allergies  Allergen Reactions   Sulfa Antibiotics Rash    Consultations: Cardiology Pulmonology  Procedures/Studies: CT Angio Chest PE W and/or Wo Contrast  Result Date: 10/24/2021 CLINICAL DATA:  Acute onset shortness of breath. History of lung cancer. EXAM: CT ANGIOGRAPHY CHEST WITH CONTRAST TECHNIQUE: Multidetector CT imaging of the chest was performed using the standard protocol during bolus administration of intravenous contrast. Multiplanar CT image reconstructions and MIPs were obtained to evaluate the vascular anatomy. CONTRAST:  72mL OMNIPAQUE IOHEXOL 350 MG/ML SOLN COMPARISON:  CT chest dated September 12, 2021. FINDINGS: Cardiovascular: Satisfactory opacification of the pulmonary arteries to the segmental level. No evidence of pulmonary embolism. Unchanged mild cardiomegaly. No pericardial effusion. No thoracic aortic aneurysm. Coronary, aortic arch, and branch vessel atherosclerotic vascular disease. Mediastinum/Nodes: No enlarged mediastinal, hilar, or axillary lymph nodes. Unchanged index subcarinal lymph node measuring 1.1 cm in short axis. Thyroid gland, trachea, and esophagus demonstrate no significant findings. Lungs/Pleura: New patchy ground-glass densities with areas of nodular consolidation in the right upper, right middle, and right lower lobes, with inter- and intralobular septal thickening. Post treatment related dense fibrotic consolidation bronchiectasis in the medial right upper and lower lobes has progressed. New small right and trace left pleural effusions. No pneumothorax. Unchanged calcified scarring in the lingula and medial right middle lobe. Mild dependent subsegmental atelectasis in the posterior left lower lobe. Mild emphysema. New 9 x 8 mm spiculated density in the right upper lobe (series 6, image 22). Upper Abdomen: No acute abnormality. Musculoskeletal: No chest wall abnormality. No acute or  significant osseous findings. Review of the MIP images confirms the above findings. IMPRESSION: 1. No evidence of pulmonary embolism. 2. New patchy ground-glass densities with areas of nodular consolidation in the right upper, right middle, and right lower lobes, with inter- and intralobular septal thickening. Differential considerations include multifocal pneumonia, asymmetric pulmonary edema, and pulmonary hemorrhage. 3. New small right and trace left pleural effusions. 4. New 9 x 8 mm spiculated density in the right upper lobe, presumably related to underlying acute process in the right lung. However, attention on follow-up imaging is recommended. 5. Aortic Atherosclerosis (ICD10-I70.0) and Emphysema (ICD10-J43.9). Electronically Signed   By: Titus Dubin M.D.   On: 10/24/2021 11:14   DG Chest Port 1 View  Result Date: 10/28/2021 CLINICAL DATA:  Acute shortness of breath. Hypoxemia. Lung carcinoma. EXAM: PORTABLE CHEST 1 VIEW COMPARISON:  10/25/2021 FINDINGS: The heart size and mediastinal contours are within normal limits. Dual lead transvenous pacemaker remains in appropriate position. Aortic atherosclerotic calcification noted. Low lung volumes are again seen. Infiltrate or atelectasis is again seen in the inferior right upper lobe, abutting the minor fissure, without significant change. No new or increased areas of pulmonary opacity are seen. No evidence of pleural effusion. IMPRESSION: No significant change in right upper lobe infiltrate or atelectasis. Electronically Signed   By: Marlaine Hind M.D.   On: 10/28/2021 09:45   DG Chest Port 1 View  Result Date: 10/25/2021 CLINICAL DATA:  87 year old male with history of shortness of breath and lung cancer with recent abnormal imaging. EXAM: PORTABLE CHEST 1 VIEW COMPARISON:  Chest CT of October 24, 2021 and chest radiograph of October 24, 2021. FINDINGS: LEFT-sided dual lead pacer device, leads project over the cardiac silhouette and power pack over  the LEFT chest as before. EKG leads project over the chest. Cardiomediastinal contours are stable. Increased interstitial and alveolar opacities seen more on the RIGHT than the LEFT have diminished in the short interval. Bandlike area of airspace disease in the RIGHT mid chest. No pneumothorax. On limited assessment there is no acute skeletal process. IMPRESSION: Increased interstitial and alveolar opacities seen on the RIGHT than the LEFT have diminished in the short interval. Likely reflecting improving edema and or volume overload. Bandlike area of airspace disease in the RIGHT mid chest likely atelectatic changes. Persistent basilar opacities in the RIGHT medial lower chest related to volume loss and post treatment changes, superimposed pneumonia would be difficult to exclude. Electronically Signed   By: Zetta Bills M.D.   On: 10/25/2021 11:32   DG Chest Portable 1 View  Result Date: 10/24/2021 CLINICAL DATA:  Shortness of breath. EXAM: PORTABLE CHEST 1 VIEW COMPARISON:  CT chest dated September 12, 2021. Chest x-ray dated July 03, 2021. FINDINGS: Left chest wall pacemaker again noted. Unchanged mild cardiomegaly. New hazy airspace disease in the right mid and lower lung. Unchanged scarring in the left lung base and post treatment changes in the medial right lower lobe. No pleural effusion or pneumothorax. No acute osseous abnormality. IMPRESSION: 1. New hazy airspace disease in the right mid and lower lung concerning for pneumonia. Electronically Signed   By: Titus Dubin M.D.   On: 10/24/2021 08:54   ECHOCARDIOGRAM COMPLETE  Result Date: 10/25/2021    ECHOCARDIOGRAM REPORT   Patient Name:   Howard [name] Date of Exam: 10/25/2021 Medical Rec #:  [id number]      Height:       69.0 in Accession #:    [id number]     Weight:       185.2 lb Date of Birth:  [birth year]       BSA:          2.000 m Patient Age:    53 years       BP:           123/69 mmHg Patient Gender: M              HR:           106 bpm.  Exam Location:  [facility] Procedure: 2D Echo, Color Doppler and Cardiac Doppler Indications:     Elevated troponin  History:         Patient has prior history of Echocardiogram examinations, most  recent 07/24/2021. CHF, CAD, Pacemaker; Risk Factors:Diabetes,                  Hypertension and Dyslipidemia. Hx of radiation therapy.  Sonographer:     Charmayne Sheer Referring Phys:  [id number] RAQTMAU Kamauri Kathol Diagnosing Phys: [name] MD IMPRESSIONS  1. Left ventricular ejection fraction, by estimation, is 20 to 25%. The left ventricle has severely decreased function. The left ventricle demonstrates global hypokinesis. There is mild left ventricular hypertrophy. Left ventricular diastolic parameters  are consistent with Grade II diastolic dysfunction (pseudonormalization).  2. Right ventricular systolic function is normal. The right ventricular size is normal. Tricuspid regurgitation signal is inadequate for assessing PA pressure.  3. Left atrial size was moderately dilated.  4. The mitral valve is normal in structure. Mild to moderate mitral valve regurgitation. No evidence of mitral stenosis.  5. The aortic valve was not well visualized. Aortic valve regurgitation is not visualized. Mild to moderate aortic valve sclerosis/calcification is present, without any evidence of aortic stenosis.  6. There is borderline dilatation of the aortic root, measuring 37 mm.  7. The inferior vena cava is normal in size with greater than 50% respiratory variability, suggesting right atrial pressure of 3 mmHg. FINDINGS  Left Ventricle: Left ventricular ejection fraction, by estimation, is 20 to 25%. The left ventricle has severely decreased function. The left ventricle demonstrates global hypokinesis. The left ventricular internal cavity size was normal in size. There is mild left ventricular hypertrophy. Left ventricular diastolic parameters are consistent with Grade II diastolic dysfunction (pseudonormalization). Right  Ventricle: The right ventricular size is normal. No increase in right ventricular wall thickness. Right ventricular systolic function is normal. Tricuspid regurgitation signal is inadequate for assessing PA pressure. Left Atrium: Left atrial size was moderately dilated. Right Atrium: Right atrial size was normal in size. Pericardium: There is no evidence of pericardial effusion. Mitral Valve: The mitral valve is normal in structure. There is mild thickening of the mitral valve leaflet(s). Mild to moderate mitral valve regurgitation. No evidence of mitral valve stenosis. MV peak gradient, 11.8 mmHg. The mean mitral valve gradient  is 4.0 mmHg. Tricuspid Valve: The tricuspid valve is normal in structure. Tricuspid valve regurgitation is not demonstrated. No evidence of tricuspid stenosis. Aortic Valve: The aortic valve was not well visualized. Aortic valve regurgitation is not visualized. Mild to moderate aortic valve sclerosis/calcification is present, without any evidence of aortic stenosis. Aortic valve mean gradient measures 2.0 mmHg.  Aortic valve peak gradient measures 4.8 mmHg. Aortic valve area, by VTI measures 2.19 cm. Pulmonic Valve: The pulmonic valve was normal in structure. Pulmonic valve regurgitation is not visualized. No evidence of pulmonic stenosis. Aorta: The aortic root is normal in size and structure. There is borderline dilatation of the aortic root, measuring 37 mm. Venous: The inferior vena cava is normal in size with greater than 50% respiratory variability, suggesting right atrial pressure of 3 mmHg. IAS/Shunts: No atrial level shunt detected by color flow Doppler. Additional Comments: A device lead is visualized.  LEFT VENTRICLE PLAX 2D LVIDd:         4.70 cm   Diastology LVIDs:         3.46 cm   LV e' medial:    7.29 cm/s LV PW:         1.09 cm   LV E/e' medial:  20.3 LV IVS:        0.89 cm   LV e' lateral:   12.40 cm/s LVOT diam:  2.40 cm   LV E/e' lateral: 11.9 LV SV:         32 LV SV  Index:   16 LVOT Area:     4.52 cm  LEFT ATRIUM             Index LA diam:        5.50 cm 2.75 cm/m LA Vol (A2C):   66.1 ml 33.06 ml/m LA Vol (A4C):   41.0 ml 20.50 ml/m LA Biplane Vol: 52.1 ml 26.06 ml/m  AORTIC VALVE                    PULMONIC VALVE AV Area (Vmax):    2.14 cm     PV Vmax:       0.67 m/s AV Area (Vmean):   2.22 cm     PV Vmean:      49.300 cm/s AV Area (VTI):     2.19 cm     PV VTI:        0.094 m AV Vmax:           110.00 cm/s  PV Peak grad:  1.8 mmHg AV Vmean:          65.100 cm/s  PV Mean grad:  1.0 mmHg AV VTI:            0.144 m AV Peak Grad:      4.8 mmHg AV Mean Grad:      2.0 mmHg LVOT Vmax:         52.00 cm/s LVOT Vmean:        31.900 cm/s LVOT VTI:          0.070 m LVOT/AV VTI ratio: 0.48  AORTA Ao Root diam: 3.70 cm MITRAL VALVE MV Area (PHT): 5.58 cm     SHUNTS MV Area VTI:   1.06 cm     Systemic VTI:  0.07 m MV Peak grad:  11.8 mmHg    Systemic Diam: 2.40 cm MV Mean grad:  4.0 mmHg MV Vmax:       1.72 m/s MV Vmean:      89.6 cm/s MV Decel Time: 136 msec MV E velocity: 148.00 cm/s MV A velocity: 51.40 cm/s MV E/A ratio:  2.88 [name] MD Electronically signed by [name] MD Signature Date/Time: 10/25/2021/5:46:45 PM    Final    CUP PACEART REMOTE DEVICE CHECK  Result Date: 10/09/2021 Scheduled remote reviewed. Normal device function.  14 AMS, <10sec PAT 1 PMT appropriately treated without termination Next remote 91 days. LR   Subjective: Patient was seen and examined today.  No new complaints.  Appears to be at his baseline.  Saturating well on his baseline oxygen requirement of 3 L.  Wife at bedside. She was concerned about him getting in and out of the house as it requires few stairs, she herself is 87 year old and cannot help him.  Home health services will definitely help. We discussed about getting a ramp-ordered.  Discharge Exam: Vitals:   10/28/21 0443 10/28/21 0745  BP: (!) 141/74 135/83  Pulse: 95 87  Resp: 16 18  Temp: 98 F (36.7 C)  98.4 F (36.9 C)  SpO2: 97% 100%   Vitals:   10/27/21 1950 10/28/21 0000 10/28/21 0443 10/28/21 0745  BP: (!) 143/55 94/73 (!) 141/74 135/83  Pulse: (!) 34 70 95 87  Resp: 15 18 16 18   Temp: 98.3 F (36.8 C) 98.6 F (37 C) 98 F (36.7 C) 98.4 F (  36.9 C)  TempSrc:  Oral Oral   SpO2: 93% 95% 97% 100%  Weight:        General: Pt is alert, awake, not in acute distress Cardiovascular: RRR, S1/S2 +, no rubs, no gallops Respiratory: CTA bilaterally, no wheezing, no rhonchi Abdominal: Soft, NT, ND, bowel sounds + Extremities: no edema, no cyanosis   The results of significant diagnostics from this hospitalization (including imaging, microbiology, ancillary and laboratory) are listed below for reference.    Microbiology: Recent Results (from the past 240 hour(s))  Resp Panel by RT-PCR (Flu A&B, Covid) Nasopharyngeal Swab     Status: None   Collection Time: 10/24/21  8:27 AM   Specimen: Nasopharyngeal Swab; Nasopharyngeal(NP) swabs in vial transport medium  Result Value Ref Range Status   SARS Coronavirus 2 by RT PCR NEGATIVE NEGATIVE Final    Comment: (NOTE) SARS-CoV-2 target nucleic acids are NOT DETECTED.  The SARS-CoV-2 RNA is generally detectable in upper respiratory specimens during the acute phase of infection. The lowest concentration of SARS-CoV-2 viral copies this assay can detect is 138 copies/mL. A negative result does not preclude SARS-Cov-2 infection and should not be used as the sole basis for treatment or other patient management decisions. A negative result may occur with  improper specimen collection/handling, submission of specimen other than nasopharyngeal swab, presence of viral mutation(s) within the areas targeted by this assay, and inadequate number of viral copies(<138 copies/mL). A negative result must be combined with clinical observations, patient history, and epidemiological information. The expected result is Negative.  Fact Sheet for Patients:   EntrepreneurPulse.com.au  Fact Sheet for Healthcare Providers:  IncredibleEmployment.be  This test is no t yet approved or cleared by the [place] FDA and  has been authorized for detection and/or diagnosis of SARS-CoV-2 by FDA under an Emergency Use Authorization (EUA). This EUA will remain  in effect (meaning this test can be used) for the duration of the COVID-19 declaration under Section 564(b)(1) of the Act, 21 U.S.C.section 360bbb-3(b)(1), unless the authorization is terminated  or revoked sooner.       Influenza A by PCR NEGATIVE NEGATIVE Final   Influenza B by PCR NEGATIVE NEGATIVE Final    Comment: (NOTE) The Xpert Xpress SARS-CoV-2/FLU/RSV plus assay is intended as an aid in the diagnosis of influenza from Nasopharyngeal swab specimens and should not be used as a sole basis for treatment. Nasal washings and aspirates are unacceptable for Xpert Xpress SARS-CoV-2/FLU/RSV testing.  Fact Sheet for Patients: EntrepreneurPulse.com.au  Fact Sheet for Healthcare Providers: IncredibleEmployment.be  This test is not yet approved or cleared by the [place] FDA and has been authorized for detection and/or diagnosis of SARS-CoV-2 by FDA under an Emergency Use Authorization (EUA). This EUA will remain in effect (meaning this test can be used) for the duration of the COVID-19 declaration under Section 564(b)(1) of the Act, 21 U.S.C. section 360bbb-3(b)(1), unless the authorization is terminated or revoked.  Performed at [facility], [address] [place], [place] [address]   Blood culture (routine x 2)     Status: None (Preliminary result)   Collection Time: 10/24/21  9:47 AM   Specimen: BLOOD  Result Value Ref Range Status   Specimen Description BLOOD LEFT HAND  Final   Special Requests   Final    BOTTLES DRAWN AEROBIC AND ANAEROBIC Blood Culture results may not be optimal due to  an inadequate volume of blood received in culture bottles   Culture   Final  NO GROWTH 4 DAYS Performed at [facility], [address] [place], [place] [address]    Report Status PENDING  Incomplete  Blood culture (routine x 2)     Status: None (Preliminary result)   Collection Time: 10/24/21 10:04 AM   Specimen: BLOOD  Result Value Ref Range Status   Specimen Description BLOOD RIGHT FA  Final   Special Requests   Final    BOTTLES DRAWN AEROBIC AND ANAEROBIC Blood Culture results may not be optimal due to an inadequate volume of blood received in culture bottles   Culture   Final    NO GROWTH 4 DAYS Performed at [facility], [address] [place], [place] [address]    Report Status PENDING  Incomplete  MRSA Next Gen by PCR, Nasal     Status: None   Collection Time: 10/24/21 10:04 AM   Specimen: Nasal Mucosa; Nasal Swab  Result Value Ref Range Status   MRSA by PCR Next Gen NOT DETECTED NOT DETECTED Final    Comment: (NOTE) The GeneXpert MRSA Assay (FDA approved for NASAL specimens only), is one component of a comprehensive MRSA colonization surveillance program. It is not intended to diagnose MRSA infection nor to guide or monitor treatment for MRSA infections. Test performance is not FDA approved in patients less than 33 years old. Performed at [facility], [address] [place], [place] [address]      Labs: BNP (last 3 results) Recent Labs    07/03/21 1335 10/24/21 0827  BNP 771.5* 5,329.9*   Basic Metabolic Panel: Recent Labs  Lab 10/25/21 0446 10/26/21 0434 10/27/21 0308 10/27/21 1749 10/28/21 0419  NA 139 137 133* 136 137  K 3.9 3.9 3.5 3.7 4.1  CL 111 111 104 107 108  CO2 20* 15* 19* 20* 24  GLUCOSE 160* 173* 297* 218* 176*  BUN 25* 30* 32* 36* 38*  CREATININE 1.11 1.18 1.10 1.30* 1.33*  CALCIUM 9.0 9.0 8.7* 8.9 9.1   Liver Function Tests: Recent Labs  Lab 10/24/21 0827 10/27/21 1749  AST 21 26  ALT 15 18   ALKPHOS 49 48  BILITOT 0.8 0.7  PROT 7.2 6.9  ALBUMIN 3.8 3.2*   No results for input(s): LIPASE, AMYLASE in the last 168 hours. No results for input(s): AMMONIA in the last 168 hours. CBC: Recent Labs  Lab 10/24/21 0827 10/25/21 0902 10/26/21 0434 10/27/21 0308 10/28/21 0419  WBC 9.7 7.2 6.7 4.9 5.4  NEUTROABS  --   --   --   --  3.6  HGB 12.6* 11.1* 10.2* 9.7* 10.5*  HCT 39.3 33.2* 31.8* 29.6* 31.6*  MCV 85.4 83.6 85.3 83.4 82.7  PLT 282 208 191 190 224   Cardiac Enzymes: No results for input(s): CKTOTAL, CKMB, CKMBINDEX, TROPONINI in the last 168 hours. BNP: Invalid input(s): POCBNP CBG: Recent Labs  Lab 10/27/21 0726 10/27/21 1212 10/27/21 1618 10/27/21 2144 10/28/21 0746  GLUCAP 158* 155* 247* 133* 164*   D-Dimer No results for input(s): DDIMER in the last 72 hours. Hgb A1c No results for input(s): HGBA1C in the last 72 hours. Lipid Profile No results for input(s): CHOL, HDL, LDLCALC, TRIG, CHOLHDL, LDLDIRECT in the last 72 hours. Thyroid function studies No results for input(s): TSH, T4TOTAL, T3FREE, THYROIDAB in the last 72 hours.  Invalid input(s): FREET3 Anemia work up No results for input(s): VITAMINB12, FOLATE, FERRITIN, TIBC, IRON, RETICCTPCT in the last 72 hours. Urinalysis    Component Value Date/Time   COLORURINE YELLOW (  A) 08/30/2019 1718   APPEARANCEUR CLEAR (A) 08/30/2019 1718   APPEARANCEUR Clear 05/07/2016 1026   LABSPEC 1.029 08/30/2019 1718   LABSPEC 1.015 11/11/2014 0450   PHURINE 6.0 08/30/2019 1718   GLUCOSEU NEGATIVE 08/30/2019 1718   GLUCOSEU Negative 11/11/2014 0450   HGBUR NEGATIVE 08/30/2019 1718   BILIRUBINUR NEGATIVE 08/30/2019 1718   BILIRUBINUR Negative 05/07/2016 1026   BILIRUBINUR Negative 11/11/2014 0450   KETONESUR 20 (A) 08/30/2019 1718   PROTEINUR NEGATIVE 08/30/2019 1718   UROBILINOGEN 0.2 04/18/2016 1057   UROBILINOGEN 0.2 06/15/2010 0942   NITRITE NEGATIVE 08/30/2019 1718   LEUKOCYTESUR NEGATIVE  08/30/2019 1718   LEUKOCYTESUR Negative 11/11/2014 0450   Sepsis Labs Invalid input(s): PROCALCITONIN,  WBC,  LACTICIDVEN Microbiology Recent Results (from the past 240 hour(s))  Resp Panel by RT-PCR (Flu A&B, Covid) Nasopharyngeal Swab     Status: None   Collection Time: 10/24/21  8:27 AM   Specimen: Nasopharyngeal Swab; Nasopharyngeal(NP) swabs in vial transport medium  Result Value Ref Range Status   SARS Coronavirus 2 by RT PCR NEGATIVE NEGATIVE Final    Comment: (NOTE) SARS-CoV-2 target nucleic acids are NOT DETECTED.  The SARS-CoV-2 RNA is generally detectable in upper respiratory specimens during the acute phase of infection. The lowest concentration of SARS-CoV-2 viral copies this assay can detect is 138 copies/mL. A negative result does not preclude SARS-Cov-2 infection and should not be used as the sole basis for treatment or other patient management decisions. A negative result may occur with  improper specimen collection/handling, submission of specimen other than nasopharyngeal swab, presence of viral mutation(s) within the areas targeted by this assay, and inadequate number of viral copies(<138 copies/mL). A negative result must be combined with clinical observations, patient history, and epidemiological information. The expected result is Negative.  Fact Sheet for Patients:  EntrepreneurPulse.com.au  Fact Sheet for Healthcare Providers:  IncredibleEmployment.be  This test is no t yet approved or cleared by the [place] FDA and  has been authorized for detection and/or diagnosis of SARS-CoV-2 by FDA under an Emergency Use Authorization (EUA). This EUA will remain  in effect (meaning this test can be used) for the duration of the COVID-19 declaration under Section 564(b)(1) of the Act, 21 U.S.C.section 360bbb-3(b)(1), unless the authorization is terminated  or revoked sooner.       Influenza A by PCR NEGATIVE NEGATIVE  Final   Influenza B by PCR NEGATIVE NEGATIVE Final    Comment: (NOTE) The Xpert Xpress SARS-CoV-2/FLU/RSV plus assay is intended as an aid in the diagnosis of influenza from Nasopharyngeal swab specimens and should not be used as a sole basis for treatment. Nasal washings and aspirates are unacceptable for Xpert Xpress SARS-CoV-2/FLU/RSV testing.  Fact Sheet for Patients: EntrepreneurPulse.com.au  Fact Sheet for Healthcare Providers: IncredibleEmployment.be  This test is not yet approved or cleared by the [place] FDA and has been authorized for detection and/or diagnosis of SARS-CoV-2 by FDA under an Emergency Use Authorization (EUA). This EUA will remain in effect (meaning this test can be used) for the duration of the COVID-19 declaration under Section 564(b)(1) of the Act, 21 U.S.C. section 360bbb-3(b)(1), unless the authorization is terminated or revoked.  Performed at [facility], [address] [place], [place] [address]   Blood culture (routine x 2)     Status: None (Preliminary result)   Collection Time: 10/24/21  9:47 AM   Specimen: BLOOD  Result Value Ref Range Status   Specimen Description BLOOD LEFT HAND  Final  Special Requests   Final    BOTTLES DRAWN AEROBIC AND ANAEROBIC Blood Culture results may not be optimal due to an inadequate volume of blood received in culture bottles   Culture   Final    NO GROWTH 4 DAYS Performed at [facility], [address] [place], [place] [address]    Report Status PENDING  Incomplete  Blood culture (routine x 2)     Status: None (Preliminary result)   Collection Time: 10/24/21 10:04 AM   Specimen: BLOOD  Result Value Ref Range Status   Specimen Description BLOOD RIGHT FA  Final   Special Requests   Final    BOTTLES DRAWN AEROBIC AND ANAEROBIC Blood Culture results may not be optimal due to an inadequate volume of blood received in culture bottles   Culture    Final    NO GROWTH 4 DAYS Performed at [facility], [address] [place], [place] [address]    Report Status PENDING  Incomplete  MRSA Next Gen by PCR, Nasal     Status: None   Collection Time: 10/24/21 10:04 AM   Specimen: Nasal Mucosa; Nasal Swab  Result Value Ref Range Status   MRSA by PCR Next Gen NOT DETECTED NOT DETECTED Final    Comment: (NOTE) The GeneXpert MRSA Assay (FDA approved for NASAL specimens only), is one component of a comprehensive MRSA colonization surveillance program. It is not intended to diagnose MRSA infection nor to guide or monitor treatment for MRSA infections. Test performance is not FDA approved in patients less than 91 years old. Performed at [facility], [address] [place], [place] [address]     Time coordinating discharge: Over 30 minutes  SIGNED:  Lorella Nimrod, MD  [facility] 10/28/2021, 11:50 AM  If 7PM-7AM, please contact night-coverage [email or web address]  This record has been created using Systems analyst. Errors have been sought and corrected,but may not always be located. Such creation errors do not reflect on the standard of care.

## 2021-10-28 NOTE — Progress Notes (Signed)
[facility])  Referral received for outpatient palliative care services in the community.  [facility] will continue to follow.  Thank you, [name] RN, BSN [facility] Liaison

## 2021-10-28 NOTE — TOC Transition Note (Addendum)
Transition of Care [facility]) - CM/SW Discharge Note   Patient Details  Name: Howard [name] MRN: [id number] Date of Birth: [birth year]  [facility]) CM/SW Contact:  Magnus Ivan, LCSW Phone Number: 10/28/2021, 2:21 PM   Clinical Narrative:   Patient discharging home. MD reported patient needs EMS transport.  EMS paperwork completed and printed to unit. Asked RN to add to [employer]  [facility] called for transport. Notified them patient is on o2. Patient is 2nd on EMS list pending truck availability. Notified RN and patient's wife Howard [name].  MD ordered ramp, informed [name] we do not set up ramps but provided info and encouraged her to reach out to PCP for guidance on this as well. Per MD patient needs OP Palliative as well. Notified [facility] [name].  Final next level of care: [place] Barriers to Discharge: Barriers Resolved   Patient Goals and CMS Choice Patient states their goals for this hospitalization and ongoing recovery are:: home with home health CMS Medicare.gov Compare Post Acute Care list provided to:: Patient Represenative (must comment) Choice offered to / list presented to : Spouse  Discharge Placement                    Patient and family notified of of transfer: 10/28/21  Discharge Plan and Services In-house Referral: Clinical Social Work   Post Acute Care Choice: NA            DME Agency: NA       HH Arranged: PT, OT, RN [place] Agency: [place] ([place]) Date [place]: 10/28/21   Representative spoke with at [place]: [place] ([place]) Interventions     Readmission Risk Interventions Readmission Risk Prevention Plan 10/26/2021 09/02/2019 08/28/2019  Transportation Screening Complete Complete Complete  PCP or Specialist Appt within 5-7 Days - - Complete  PCP or Specialist Appt within 3-5 Days Complete Complete -  Home Care Screening - - Complete  Medication Review (RN CM)  - - Complete  HRI or Home Care Consult Complete Complete -  Social Work Consult for [place] Planning/Counseling Complete Complete -  Palliative Care Screening Not Applicable Not Applicable -  Medication Review Press photographer) Complete Complete -  Some recent data might be hidden

## 2021-10-28 NOTE — Progress Notes (Signed)
Progress Note  Patient Name: Howard [name] Date of Encounter: 10/28/2021  [facility] Cardiologist: [name], MD   Subjective   No acute events overnight.  Likely discharge today with home health.  Inpatient Medications    Scheduled Meds:  apixaban  5 mg Oral BID   aspirin EC  81 mg Oral Daily   cholecalciferol  1,000 Units Oral Daily   dapagliflozin propanediol  10 mg Oral Daily   donepezil  5 mg Oral Daily   ezetimibe  10 mg Oral Daily   fluticasone furoate-vilanterol  1 puff Inhalation Daily   And   umeclidinium bromide  1 puff Inhalation Daily   furosemide  40 mg Intravenous Once   [START ON 10/29/2021] furosemide  40 mg Oral Daily   insulin aspart  0-15 Units Subcutaneous TID WC   insulin aspart  0-5 Units Subcutaneous QHS   isosorbide mononitrate  30 mg Oral Daily   loratadine  10 mg Oral Daily   metoprolol succinate  25 mg Oral QHS   multivitamin with minerals  1 tablet Oral Daily   pantoprazole  40 mg Oral Daily   sacubitril-valsartan  1 tablet Oral BID   sertraline  50 mg Oral QHS   simvastatin  40 mg Oral QHS   sodium bicarbonate  650 mg Oral Daily   sodium chloride flush  3 mL Intravenous Q12H   spironolactone  25 mg Oral Daily   Continuous Infusions:  sodium chloride     ceFEPime (MAXIPIME) IV 2 g (10/28/21 0854)   PRN Meds: sodium chloride, acetaminophen, montelukast, ondansetron (ZOFRAN) IV, sodium chloride flush   Vital Signs    Vitals:   10/27/21 1950 10/28/21 0000 10/28/21 0443 10/28/21 0745  BP: (!) 143/55 94/73 (!) 141/74 135/83  Pulse: (!) 34 70 95 87  Resp: 15 18 16 18   Temp: 98.3 F (36.8 C) 98.6 F (37 C) 98 F (36.7 C) 98.4 F (36.9 C)  TempSrc:  Oral Oral   SpO2: 93% 95% 97% 100%  Weight:        Intake/Output Summary (Last 24 hours) at 10/28/2021 1201 Last data filed at 10/28/2021 1000 Gross per 24 hour  Intake 1217.7 ml  Output 475 ml  Net 742.7 ml   Last 3 Weights 10/27/2021 10/26/2021 10/25/2021  Weight (lbs)  182 lb 12.8 oz 181 lb 1.6 oz 185 lb 3 oz  Weight (kg) 82.918 kg 82.146 kg 84 kg      Telemetry    A sensed V paced rhythm- Personally Reviewed  ECG     - Personally Reviewed  Physical Exam   GEN:  Well nourished, no acute distress, appears frail. HEENT: Normal CARDIAC: RRR RESPIRATORY: Diminished breath sounds at bases ABDOMEN: Soft, non-tender, non-distended MUSCULOSKELETAL:  No edema; No deformity  SKIN: Warm and dry NEUROLOGIC:  Alert and oriented x 3 PSYCHIATRIC:  Normal affect   Labs    High Sensitivity Troponin:   Recent Labs  Lab 10/24/21 0827 10/24/21 1036 10/24/21 1728 10/25/21 0902 10/25/21 1033  TROPONINIHS 70* 180* 12,859*  2,390* 10,829* 10,263*     Chemistry Recent Labs  Lab 10/24/21 0827 10/25/21 0446 10/27/21 0308 10/27/21 1749 10/28/21 0419  NA 139   < > 133* 136 137  K 4.1   < > 3.5 3.7 4.1  CL 110   < > 104 107 108  CO2 18*   < > 19* 20* 24  GLUCOSE 241*   < > 297* 218* 176*  BUN  25*   < > 32* 36* 38*  CREATININE 1.27*   < > 1.10 1.30* 1.33*  CALCIUM 9.1   < > 8.7* 8.9 9.1  PROT 7.2  --   --  6.9  --   ALBUMIN 3.8  --   --  3.2*  --   AST 21  --   --  26  --   ALT 15  --   --  18  --   ALKPHOS 49  --   --  48  --   BILITOT 0.8  --   --  0.7  --   GFRNONAA 54*   < > >60 53* 51*  ANIONGAP 11   < > 10 9 5    < > = values in this interval not displayed.    Lipids No results for input(s): CHOL, TRIG, HDL, LABVLDL, LDLCALC, CHOLHDL in the last 168 hours.  Hematology Recent Labs  Lab 10/26/21 0434 10/27/21 0308 10/28/21 0419  WBC 6.7 4.9 5.4  RBC 3.73* 3.55* 3.82*  HGB 10.2* 9.7* 10.5*  HCT 31.8* 29.6* 31.6*  MCV 85.3 83.4 82.7  MCH 27.3 27.3 27.5  MCHC 32.1 32.8 33.2  RDW 18.3* 17.7* 17.5*  PLT 191 190 224   Thyroid No results for input(s): TSH, FREET4 in the last 168 hours.  BNP Recent Labs  Lab 10/24/21 0827  BNP 4,227.2*    DDimer No results for input(s): DDIMER in the last 168 hours.   Radiology    DG Chest  Port 1 View  Result Date: 10/28/2021 CLINICAL DATA:  Acute shortness of breath. Hypoxemia. Lung carcinoma. EXAM: PORTABLE CHEST 1 VIEW COMPARISON:  10/25/2021 FINDINGS: The heart size and mediastinal contours are within normal limits. Dual lead transvenous pacemaker remains in appropriate position. Aortic atherosclerotic calcification noted. Low lung volumes are again seen. Infiltrate or atelectasis is again seen in the inferior right upper lobe, abutting the minor fissure, without significant change. No new or increased areas of pulmonary opacity are seen. No evidence of pleural effusion. IMPRESSION: No significant change in right upper lobe infiltrate or atelectasis. Electronically Signed   By: Marlaine Hind M.D.   On: 10/28/2021 09:45    Cardiac Studies   Echocardiogram 07/2021 EF 20 to 25%  Patient Profile     87 y.o. male with history of an ICM EF 20 to 25%, complete heart block s/p permanent pacemaker, lung cancer s/p radiation therapy, chronic hypoxemia on 3 L home oxygen, OSA on CPAP presenting with acute onset shortness of breath and cough.  Diagnosed with multilobar pneumonia.  Being seen for NSTEMI, CHF.  Assessment & Plan    1.  Cardiomyopathy, EF 20 to 25% -Appears euvolemic -Lasix 40 mg daily  -Toprol-XL, Entresto, Aldactone.   2.  CHB s/p permanent pacemaker -Pacemaker appears to be functioning normally   3.  NSTEMI, history of splenic infarct -Denies chest pain -S/p heparin x48 hours -Eliquis    4.  pneumonia, Weakness, deconditioning -Management as per medicine team -PT eval, plan is for home with home health    Greater than 50% was spent in counseling and coordination of care with patient Total encounter time 35 minutes       Signed, Kate Sable, MD  10/28/2021, 12:01 PM

## 2021-10-29 ENCOUNTER — Telehealth: Payer: Self-pay | Admitting: Medical

## 2021-10-29 ENCOUNTER — Encounter: Payer: Medicare Other | Admitting: Physical Therapy

## 2021-10-29 ENCOUNTER — Telehealth: Payer: Self-pay | Admitting: Family Medicine

## 2021-10-29 DIAGNOSIS — Z9181 History of falling: Secondary | ICD-10-CM | POA: Diagnosis not present

## 2021-10-29 DIAGNOSIS — Z9981 Dependence on supplemental oxygen: Secondary | ICD-10-CM | POA: Diagnosis not present

## 2021-10-29 DIAGNOSIS — C3491 Malignant neoplasm of unspecified part of right bronchus or lung: Secondary | ICD-10-CM | POA: Diagnosis not present

## 2021-10-29 DIAGNOSIS — I110 Hypertensive heart disease with heart failure: Secondary | ICD-10-CM | POA: Diagnosis not present

## 2021-10-29 DIAGNOSIS — Z7984 Long term (current) use of oral hypoglycemic drugs: Secondary | ICD-10-CM | POA: Diagnosis not present

## 2021-10-29 DIAGNOSIS — E119 Type 2 diabetes mellitus without complications: Secondary | ICD-10-CM | POA: Diagnosis not present

## 2021-10-29 DIAGNOSIS — J9621 Acute and chronic respiratory failure with hypoxia: Secondary | ICD-10-CM | POA: Diagnosis not present

## 2021-10-29 DIAGNOSIS — Z7951 Long term (current) use of inhaled steroids: Secondary | ICD-10-CM | POA: Diagnosis not present

## 2021-10-29 DIAGNOSIS — I5023 Acute on chronic systolic (congestive) heart failure: Secondary | ICD-10-CM | POA: Diagnosis not present

## 2021-10-29 DIAGNOSIS — Z923 Personal history of irradiation: Secondary | ICD-10-CM | POA: Diagnosis not present

## 2021-10-29 DIAGNOSIS — I2510 Atherosclerotic heart disease of native coronary artery without angina pectoris: Secondary | ICD-10-CM | POA: Diagnosis not present

## 2021-10-29 DIAGNOSIS — K219 Gastro-esophageal reflux disease without esophagitis: Secondary | ICD-10-CM | POA: Diagnosis not present

## 2021-10-29 DIAGNOSIS — Z7982 Long term (current) use of aspirin: Secondary | ICD-10-CM | POA: Diagnosis not present

## 2021-10-29 DIAGNOSIS — E785 Hyperlipidemia, unspecified: Secondary | ICD-10-CM | POA: Diagnosis not present

## 2021-10-29 DIAGNOSIS — Z950 Presence of cardiac pacemaker: Secondary | ICD-10-CM | POA: Diagnosis not present

## 2021-10-29 DIAGNOSIS — Z7901 Long term (current) use of anticoagulants: Secondary | ICD-10-CM | POA: Diagnosis not present

## 2021-10-29 LAB — CULTURE, BLOOD (ROUTINE X 2)
Culture: NO GROWTH
Culture: NO GROWTH

## 2021-10-29 NOTE — Progress Notes (Signed)
Cardiology Office Note  Date:  10/30/2021   ID:  [name], DOB 1931/06/29, MRN [id number]  PCP:  Jerrol Banana., MD   Chief Complaint  Patient presents with   Follow-up    3 Months follow up and medications verbally reviewed with patient and wife.    HPI:  Mr. Moyers is a 87 year old-year-old gentleman with a hx of  DM II complete heart block, status post pacemaker in 2011,  nonischemic cardiomyopathy,  ejection fraction 30-35%   coronary artery disease, 70% distal RCA disease, 30 and 40% lesions in his ramus, LAD  admission to the hospital 09/05/2014  with acute cholecystitis, ileus.  Cardiac catheterization at that time for positive stress test . Lung CA  Echo 07/2019: EF 55 to 60% Ef 2022: 20 to 25% He presents for follow-up of his CAD  Last week in the hospital for non-STEMI, troponin  12K, right greater than left lung pneumonitis, fibrosis pneumonia D/c 2 days ago Presents today with his son, wife She feels left hospital too soon, Gasping after shower,  showering without his oxygen Poor energy, trouble getting back to his baseline Getting home PT Memory not as good per his wife,  Has not arranged follow-up with pulmonary yet, would like to follow-up  CT scan images pulled up and reviewed, right greater than left opacity at the bases  Discussed hospital events Probably a good decision not to proceed with heart catheterization at the time given frail nature  Echocardiogram done in the hospital severely depressed ejection fraction 25%, unchanged from prior studies  EKG personally reviewed by myself on todays visit Paced rhythm   Last seen by myself in clinic December 2021 Diagnosis lung malignancy Seen by one of our providers April 2022 At that time reported worsening shortness of breath, cough Echocardiogram April 2022 ejection fraction 20 to 25% dilated IVC Catheterization performed mild to moderate three-vessel disease, nonischemic  cardiomyopathy On catheterization was not felt to be volume overloaded Seen back in follow-up in clinic Apr 23, 2021 Has started chemotherapy Entresto increased to 49/51 twice daily Lasix 20 daily spironolactone 25 daily metoprolol 25 daily Seen back in clinic June 04, 2021 Completing radiation treatment He did not tolerate Farxiga  Echocardiogram  Ejection fraction 20 to 25% global hypokinesis LVH  hospital August 30, 2019 with abdominal pain, CT scan confirming splenic infarct, right renal artery malperfusion TEE with no evidence of left atrial thrombus or left atrial appendage thrombus Severe aortic atherosclerosis,  New pacer 06/2019 Pacer downloads reviewed: Stable  Sept 2020, vascular records reviewed with him in detail AAA, right renal artery stenosis, severe aortoiliac occlusive disease with left iliac occlusion and right iliac stenosis, left SFA stenosis, rest pain and pregangrenous changes to the left great toe 1. Left common femoral artery and profunda femoris artery endarterectomy with core matrix patch angioplasty 2. Placement of a 6 mm diameter by 15 cm length via bond stent left superficial femoral artery postdilated with a 5 mm diameter Lutonix drug-coated angioplasty balloon 3 Right renal artery stent placement with 7 mm diameter by 16 mm length lifestream stent---Placement of a 23 mm diameter 14 cm length Gore Excluder Endoprosthesis main body right with a 14 mm diameter by 12 cm length left contralateral limb ---Stent placement in the right external iliac artery with 10 mm diameter by 58 mm length lifestream stent for occlusive disease --Gore excluder limb extension with a 12 mm diameter by 14 cm length limb to the distal right external iliac artery  for occlusive disease --Viabahn stent placement to the left external iliac artery with 13 mm diameter by 10 cm length stent for occlusive disease ---Fogarty embolectomy left SFA  TEE 08/2019  . The left ventricle has low  normal systolic function, with an ejection  fraction of 50-55%. The cavity size was normal. There is mildly increased  left ventricular wall thickness. Left ventricular diastolic Doppler  parameters are consistent with  indeterminate diastolic dysfunction. Indeterminate filling pressures No  evidence of left ventricular regional wall motion abnormalities.   2. The right ventricle has normal systolc function. There is no increase  in right ventricular wall thickness.   3. Left atrial size was moderately dilated.   4. No evidence of a thrombus present in the left atrium or left atrial  appendage.   5. Evidence of atrial level shunting detected by color flow Doppler, PFO  present, saline contrast bubble study positive for PFO, small number of  bubbles crossing at rest, unable to test with valsalva   6. Moderate plaque in the descending aorta and aortic arch.   Limited by history of back pain, back surgery x6  hospital November 2016 for abscess on his back, had surgery Nodules found in the right lung, biopsy documenting adenocarcinoma 1.3 cm, treated with radiation  Previous CT chest 03/2016 Decreasing size of pathology-proven adenocarcinoma of right upper lobe, with associated radiation treatment effects of the lung.   PMH:   has a past medical history of Arthritis, Bell palsy, Bell's palsy (04/12/2015), Cancer ([place]), Chronic combined systolic and diastolic CHF, NYHA class 1 ([place]), Complete heart block ([place]), Depression, Diabetes mellitus without complication ([place]), Fall (11-10-14), GERD (gastroesophageal reflux disease), History of prostate cancer, Hyperlipidemia, Hypertension, LBBB (left bundle branch block), Left-sided Bell's palsy, Lung cancer ([place]) (2016), NICM (nonischemic cardiomyopathy) ([place]), Non-obstructive CAD, Poor balance, Presence of permanent cardiac pacemaker, Sleep apnea, Vertigo, and WPW (Wolff-Parkinson-White syndrome).  PSH:    Past Surgical History:  Procedure  Laterality Date   ABDOMINAL AORTIC ENDOVASCULAR STENT GRAFT  08/25/2019   Procedure: ABDOMINAL AORTIC ENDOVASCULAR STENT GRAFT;  Surgeon: Algernon Huxley, MD;  Location: [facility] ORS;  Service: Vascular;;   ANGIOPLASTY Left 08/25/2019   Procedure: ANGIOPLASTY;  Surgeon: Algernon Huxley, MD;  Location: [facility];  Service: Vascular;  Laterality: Left;  left SFA and stent placement   APPLICATION OF WOUND VAC Left 06/07/2015   Procedure: APPLICATION OF WOUND VAC;  Surgeon: Robert Bellow, MD;  Location: [facility];  Service: General;  Laterality: Left;  left upper back   [place]  08/26/2014   Single vessel obstructive CAD   CARPAL TUNNEL RELEASE  04-04-15   [facility]   CATARACT EXTRACTION  07-31-11 and 09-18-11   Catheter ablation  1991   for WPW   cervical fusion     CHOLECYSTECTOMY  09-07-14   ENDARTERECTOMY FEMORAL Left 08/25/2019   Procedure: ENDARTERECTOMY FEMORAL;  Surgeon: Algernon Huxley, MD;  Location: [facility];  Service: Vascular;  Laterality: Left;  common and produndis    ENDOVASCULAR REPAIR/STENT GRAFT Right 08/25/2019   Procedure: ENDOVASCULAR REPAIR/STENT GRAFT;  Surgeon: Algernon Huxley, MD;  Location: [facility];  Service: Vascular;  Laterality: Right;  renal artery   HAND SURGERY     right 1993; left 2005   [place] / REPLACE / [place] Bilateral 08/25/2019   Procedure: INSERTION OF ILIAC  STENT;  Surgeon: Algernon Huxley, MD;  Location: [facility];  Service: Vascular;  Laterality: Bilateral;   JOINT REPLACEMENT Left 2013   knee   JOINT REPLACEMENT Right 2004   knee   KNEE SURGERY     left knee 1991 and 1992; right knee 1995   LEFT HEART CATHETERIZATION WITH CORONARY ANGIOGRAM N/A 08/26/2014   Procedure: LEFT HEART CATHETERIZATION WITH CORONARY ANGIOGRAM;  Surgeon: [name], MD;  Location: [facility] C[facility] LAB;  Service: Cardiovascular;  Laterality: N/A;   LOWER EXTREMITY ANGIOGRAPHY Left 08/23/2019   Procedure: Lower  Extremity Angiography;  Surgeon: Algernon Huxley, MD;  Location: [place] CV LAB;  Service: Cardiovascular;  Laterality: Left;   LUMBAR LAMINECTOMY/DECOMPRESSION MICRODISCECTOMY N/A 06/07/2014   Procedure: LUMBAR FOUR TO FIVE LUMBAR LAMINECTOMY/DECOMPRESSION MICRODISCECTOMY 1 LEVEL;  Surgeon: [name], MD;  Location: [place] NEURO ORS;  Service: Neurosurgery;  Laterality: N/A;   LUNG BIOPSY Right 2016   Dr [name]   MOHS SURGERY     PACEMAKER INSERTION     PPM-- St Jude 11/30/10 by [name]   PPM GENERATOR CHANGEOUT N/A 07/09/2019   Procedure: PPM GENERATOR CHANGEOUT;  Surgeon: Deboraha Sprang, MD;  Location: [place] CV LAB;  Service: Cardiovascular;  Laterality: N/A;   PROSTATE SURGERY     cancer--1998, prostatectomy   REPLACEMENT TOTAL KNEE     2004   RIGHT/LEFT HEART CATH AND CORONARY ANGIOGRAPHY Bilateral 04/13/2021   Procedure: RIGHT/LEFT HEART CATH AND CORONARY ANGIOGRAPHY;  Surgeon: Wellington Hampshire, MD;  Location: [place] CV LAB;  Service: Cardiovascular;  Laterality: Bilateral;   ruptured disc     1962 and 1998   TEE WITHOUT CARDIOVERSION N/A 09/01/2019   Procedure: TRANSESOPHAGEAL ECHOCARDIOGRAM (TEE);  Surgeon: [name], MD;  Location: [facility];  Service: Cardiovascular;  Laterality: N/A;   TEMPORARY PACEMAKER N/A 07/09/2019   Procedure: TEMPORARY PACEMAKER;  Surgeon: Deboraha Sprang, MD;  Location: [place] CV LAB;  Service: Cardiovascular;  Laterality: N/A;   TRIGGER FINGER RELEASE  01-24-15   WOUND DEBRIDEMENT Left 06/07/2015   Procedure: DEBRIDEMENT WOUND;  Surgeon: Robert Bellow, MD;  Location: [facility] OR[facility];  Service: General;  Laterality: Left;  left upper back    Current Outpatient Medications  Medication Sig Dispense Refill   acetaminophen (TYLENOL) 500 MG tablet Take 1,000 mg by mouth every 6 (six) hours as needed for moderate pain.     albuterol (VENTOLIN HFA) 108 (90 Base) MCG/ACT inhaler INHALE 2 PUFFS INTO LUNGS EVERY 6 HOURS AS NEEDED FOR WHEEZING OR  SHORTNESS OF BREATH 8.5 g 6   aspirin EC 81 MG tablet Take 81 mg by mouth daily.     cetirizine (ZYRTEC) 10 MG tablet Take 10 mg by mouth daily.     Cholecalciferol (VITAMIN D3) 1000 UNITS CAPS Take 1,000 Units by mouth daily.     donepezil (ARICEPT) 5 MG tablet Take 1 tablet (5 mg total) by mouth daily. 30 tablet 12   ELIQUIS 5 MG TABS tablet TAKE 1 TABLET TWICE A DAY  (SWITCHED FROM PLAVIX) (Patient taking differently: Take 5 mg by mouth 2 (two) times daily.) 60 tablet 11   ezetimibe (ZETIA) 10 MG tablet TAKE 1 TABLET DAILY 90 tablet 0   Fluticasone-Umeclidin-Vilant (TRELEGY ELLIPTA) 100-62.5-25 MCG/INH AEPB Inhale 1 puff into the lungs daily. 180 each 3   furosemide (LASIX) 20 MG tablet TAKE 1 TABLET BY MOUTH ONCE DAILY 30 tablet 5   Iron-Vitamin C (VITRON-C) 65-125 MG TABS Take 1  tablet by mouth daily. 30 tablet 2   isosorbide mononitrate (IMDUR) 30 MG 24 hr tablet TAKE 1 TABLET DAILY 90 tablet 2   megestrol (MEGACE) 40 MG tablet Take 1 tablet (40 mg total) by mouth 2 (two) times daily. 60 tablet 2   metFORMIN (GLUCOPHAGE) 500 MG tablet Take 1 tablet (500 mg total) by mouth 2 (two) times daily with a meal. 60 tablet 5   metoprolol succinate (TOPROL-XL) 25 MG 24 hr tablet TAKE 1 TABLET AT BEDTIME 90 tablet 0   montelukast (SINGULAIR) 10 MG tablet Take 10 mg by mouth daily as needed (sneezing/allergies.).     Multiple Vitamin (MULTIVITAMIN WITH MINERALS) TABS tablet Take 1 tablet by mouth daily. One-A-Day Multivitamin     omeprazole (PRILOSEC) 40 MG capsule TAKE 1 CAPSULE TWICE DAILY AS NEEDED 180 capsule 3   sacubitril-valsartan (ENTRESTO) 49-51 MG Take 1 tablet by mouth 2 (two) times daily. 180 tablet 3   sertraline (ZOLOFT) 50 MG tablet Take 1 tablet (50 mg total) by mouth daily. 30 tablet 3   simvastatin (ZOCOR) 40 MG tablet TAKE 1 TABLET AT BEDTIME 90 tablet 3   SODIUM BICARBONATE PO Take by mouth.     spironolactone (ALDACTONE) 25 MG tablet Take 1 tablet (25 mg total) by mouth daily. 90  tablet 3   No current facility-administered medications for this visit.     Allergies:   Sulfa antibiotics   Social History:  The patient  reports that he quit smoking about 11 years ago. His smoking use included cigarettes. He has a 56.00 pack-year smoking history. He has never used smokeless tobacco. He reports that he does not drink alcohol and does not use drugs.   Family History:   family history includes CAD in an other family member; Heart attack in his mother; Hyperlipidemia in his mother.    Review of Systems: Review of Systems  Constitutional:  Positive for malaise/fatigue.  HENT: Negative.    Respiratory: Negative.    Cardiovascular: Negative.   Gastrointestinal: Negative.   Musculoskeletal:  Positive for back pain and joint pain.  Neurological: Negative.   Psychiatric/Behavioral: Negative.    All other systems reviewed and are negative.   PHYSICAL EXAM: VS:  BP (!) 100/58 (BP Location: Right Arm, Patient Position: Sitting, Cuff Size: Normal)   Pulse 89   Ht 5\' 9"  (1.753 m)   Wt 180 lb (81.6 kg)   SpO2 98% Comment: 3 Liters of Oxygen  BMI 26.58 kg/m  , BMI Body mass index is 26.58 kg/m. Constitutional:  oriented to person, place, and time. No distress.  Appears pale, weak, in a wheelchair HENT:  Head: Grossly normal Eyes:  no discharge. No scleral icterus.  Neck: No JVD, no carotid bruits  Cardiovascular: Regular rate and rhythm, no murmurs appreciated Pulmonary/Chest: Clear to auscultation bilaterally, no wheezes or rails Abdominal: Soft.  no distension.  no tenderness.  Musculoskeletal: Normal range of motion Neurological:  normal muscle tone. Coordination normal. No atrophy Skin: Skin warm and dry Psychiatric: normal affect, pleasant  Recent Labs: 10/24/2021: B Natriuretic Peptide 4,227.2 10/27/2021: ALT 18 10/28/2021: BUN 38; Creatinine, Ser 1.33; Hemoglobin 10.5; Platelets 224; Potassium 4.1; Sodium 137    Lipid Panel Lab Results  Component Value  Date   CHOL 130 11/13/2020   HDL 43 11/13/2020   LDLCALC 63 11/13/2020   TRIG 137 11/13/2020      Wt Readings from Last 3 Encounters:  10/30/21 180 lb (81.6 kg)  10/27/21 182 lb 12.8 oz (  82.9 kg)  10/23/21 186 lb (84.4 kg)      ASSESSMENT AND PLAN:  Coronary artery disease involving native coronary artery of native heart without angina pectoris -  Recent non-STEMI in the setting of respiratory failure requiring BiPAP EF unchanged, denies anginal symptoms, decision made not to pursue ischemic work-up Family in agreement  Complete heart block ([place]) - Plan: EKG 12-Lead S/p pacemaker, followed by EP  Acute respiratory failure CT scan images pulled up and reviewed, concern for pneumonia/pneumonitis/fibrosis At site of radiation Will help arrange follow-up in pulmonary  Essential hypertension - Plan: EKG 12-Lead Low blood pressure, no med changes at this time, continue to monitor blood pressure at home With weight loss, blood pressure may run lower  Mixed hyperlipidemia  On simvastatin Zetia 10 mg daily  Nonischemic cardiomyopathy (HCC) Ejection fraction 20 to 25% Did not tolerate [place] Continue current listed medications  Chronic back pain In a wheelchair  Pacemaker-St.Jude Followed by EP  COPD/cough  Reports chronic sputum production since XRT  Malignant neoplasm of middle lobe of right lung [facility]) Completed radiation treatment unstable for chemotherapy   Long discussion concerning getting some of his medications more affordable  Total encounter time more than 25 minutes  Greater than 50% was spent in counseling and coordination of care with the patient    No orders of the defined types were placed in this encounter.    Signed, Esmond Plants, M.D., Ph.D. 10/30/2021  [place], [place]

## 2021-10-29 NOTE — Telephone Encounter (Signed)
Patient contacted regarding discharge from [facility] on 10/28/21.  Patient understands to follow up with provider Dr. [name] on 10/30/21 at 10:20 am at the [facility] office. Patient understands discharge instructions? yes Patient understands medications and regiment? yes Patient understands to bring all medications to this visit? yes  DPR on file. Spoke with the patients wife she answered yes to the questions above. She confirmed the appt. Their son will be helping with getting the patient to the appt. Pt wife voiced appreciation for the call.

## 2021-10-29 NOTE — Telephone Encounter (Signed)
Furth, Cadence H, PA-C  P Cv Div [place] Triage Pt needs hospital follow-up with [facility] in 1 week. Thx.

## 2021-10-29 NOTE — Telephone Encounter (Signed)
Home Health Verbal Orders - Caller/Agency: patricia, [place] Number: [phone] Requesting [facility] Nursing  Frequency: 1x1 /2x1 /1x7

## 2021-10-30 ENCOUNTER — Other Ambulatory Visit: Payer: Medicare Other | Admitting: Student

## 2021-10-30 ENCOUNTER — Other Ambulatory Visit: Payer: Self-pay

## 2021-10-30 ENCOUNTER — Ambulatory Visit (INDEPENDENT_AMBULATORY_CARE_PROVIDER_SITE_OTHER): Payer: Medicare Other | Admitting: Cardiovascular Disease

## 2021-10-30 ENCOUNTER — Encounter: Payer: Self-pay | Admitting: Cardiovascular Disease

## 2021-10-30 VITALS — BP 100/58 | HR 89 | Ht 69.0 in | Wt 180.0 lb

## 2021-10-30 DIAGNOSIS — R531 Weakness: Secondary | ICD-10-CM

## 2021-10-30 DIAGNOSIS — I2510 Atherosclerotic heart disease of native coronary artery without angina pectoris: Secondary | ICD-10-CM | POA: Diagnosis not present

## 2021-10-30 DIAGNOSIS — I739 Peripheral vascular disease, unspecified: Secondary | ICD-10-CM | POA: Diagnosis not present

## 2021-10-30 DIAGNOSIS — I5023 Acute on chronic systolic (congestive) heart failure: Secondary | ICD-10-CM

## 2021-10-30 DIAGNOSIS — J9600 Acute respiratory failure, unspecified whether with hypoxia or hypercapnia: Secondary | ICD-10-CM

## 2021-10-30 DIAGNOSIS — C3491 Malignant neoplasm of unspecified part of right bronchus or lung: Secondary | ICD-10-CM | POA: Diagnosis not present

## 2021-10-30 DIAGNOSIS — R0902 Hypoxemia: Secondary | ICD-10-CM

## 2021-10-30 DIAGNOSIS — I5042 Chronic combined systolic (congestive) and diastolic (congestive) heart failure: Secondary | ICD-10-CM

## 2021-10-30 DIAGNOSIS — R918 Other nonspecific abnormal finding of lung field: Secondary | ICD-10-CM | POA: Diagnosis not present

## 2021-10-30 DIAGNOSIS — Z950 Presence of cardiac pacemaker: Secondary | ICD-10-CM | POA: Diagnosis not present

## 2021-10-30 DIAGNOSIS — Z515 Encounter for palliative care: Secondary | ICD-10-CM

## 2021-10-30 DIAGNOSIS — G4730 Sleep apnea, unspecified: Secondary | ICD-10-CM | POA: Diagnosis not present

## 2021-10-30 DIAGNOSIS — I428 Other cardiomyopathies: Secondary | ICD-10-CM | POA: Diagnosis not present

## 2021-10-30 DIAGNOSIS — E785 Hyperlipidemia, unspecified: Secondary | ICD-10-CM

## 2021-10-30 DIAGNOSIS — Q2112 Patent foramen ovale: Secondary | ICD-10-CM

## 2021-10-30 DIAGNOSIS — C342 Malignant neoplasm of middle lobe, bronchus or lung: Secondary | ICD-10-CM | POA: Diagnosis not present

## 2021-10-30 DIAGNOSIS — I701 Atherosclerosis of renal artery: Secondary | ICD-10-CM

## 2021-10-30 DIAGNOSIS — R0602 Shortness of breath: Secondary | ICD-10-CM

## 2021-10-30 NOTE — Patient Instructions (Addendum)
Referral to Dr. Lanney Gins at [facility] The office will call for your first appt It may take a few weeks for your first consult  Medication Instructions:  No changes  If you need a refill on your cardiac medications before your next appointment, please call your pharmacy.   Lab work: No new labs needed  Testing/Procedures: No new testing needed  Follow-Up: At [facility], you and your health needs are our priority.  As part of our continuing mission to provide you with exceptional heart care, we have created designated Provider Care Teams.  These Care Teams include your primary Cardiologist (physician) and Advanced Practice Providers (APPs -  Physician Assistants and Nurse Practitioners) who all work together to provide you with the care you need, when you need it.  You will need a follow up appointment in 1-2 months  Providers on your designated Care Team:   Murray Hodgkins, NP Christell Faith, PA-C Cadence Kathlen Mody, [place]  COVID-19 Vaccine Information can be found at: [email or web address].uk For questions related to vaccine distribution or appointments, please email [email or web address] or call [phone])[phone].

## 2021-10-30 NOTE — Telephone Encounter (Signed)
Verbal orders were given 

## 2021-10-30 NOTE — Progress Notes (Signed)
Therapist, nutritional Palliative Care Consult Note Telephone: [phone]  Fax: [phone]    Date of encounter: 10/30/21 9:26 PM PATIENT NAME: Howard [name] [address] [place] [address]   [phone] (home)  DOB: Aug 12, 1931 MRN: [id number] PRIMARY CARE PROVIDER:    Maple Hudson., MD,  [address] [place] [address] [place] [address] [phone]  REFERRING PROVIDER:   Maple Hudson., MD [address] [place] [address] [place],  [place] [address] [phone]  RESPONSIBLE PARTY:    Contact Information     Name Relation Home Work Mobile   [place] Spouse [phone]  [phone]   Art, Levan   [phone]        I met face to face with patient and family in the home. Palliative Care was asked to follow this patient by consultation request of Chrismon, Jodell Cipro, PA-C  to address advance care planning and complex medical decision making. This is a follow up visit.                                   ASSESSMENT AND PLAN / RECOMMENDATIONS:   Advance Care Planning/Goals of Care: Goals include to maximize quality of life and symptom management. Our advance care planning conversation included a discussion about:    The value and importance of advance care planning  Experiences with loved ones who have been seriously ill or have died  Exploration of personal, cultural or spiritual beliefs that might influence medical decisions  Exploration of goals of care in the event of a sudden injury or illness Reviewed MOST form; patient states he would like to think about CPR and hospitalization  CODE STATUS: Full Code  Education provided on palliative medicine vs. Hospice services. We discussed patient's advanced disease processes/comorbidities, his quality of life and his code status. We discussed what would happen if he had CPR performed given his frailty. Patient states he will think about it more and discuss with his children.  We discussed aggressive treatments vs a comfort path. Palliative medicine will continue to follow and provide support. Will attempt to address code status.   Symptom Management/Plan:  Acute on chronic combined diastolic and systolic heart failure-continue Entresto, furosemide, spironolactone, metoprolol, as directed. Recommend daily weights; notify PCP, cardiology if 3 pound weight gain in 24 hours or 5 pound in a week, worsening shortness of breath, edema. Follow up with cardiology as scheduled.   Shortness of breath-secondary to COPD, continue Trelegy Ellipta daily as directed, oxygen via nasal canula at 3 lpm  Generalized weakness-patient encouraged to use walker, rest breaks encouraged. PT/OT as directed.   Malignant neoplasm of right lung-stage IIIb; status post SBRT. Recent CT shows post radiation changes; consistent with treatment response. Patient to have f/u chest CT every 3 months. F/u with oncology as scheduled.  Follow up Palliative Care Visit: Palliative care will continue to follow for complex medical decision making, advance care planning, and clarification of goals. Return in 3-4 weeks or prn.  I spent 40 minutes providing this consultation. More than 50% of the time in this consultation was spent in counseling and care coordination.   PPS: 40%  HOSPICE ELIGIBILITY/DIAGNOSIS: TBD  Chief Complaint: Palliative Medicine follow up visit.   HISTORY OF PRESENT ILLNESS:  Howard [name] is a 87 y.o. year old male  with chronic combined diastolic and systolic heart failure, EF 20%, COPD, nonischemic cardiomyopathy, T2DM, dx  right lung cancer in April 2022; s/p 35 radiation treatments. Patient recently hospitalized 11/2-11/05/2021 due to acute on chronic hypoxic respiratory failure, pulmonary infiltrate of right lung. He initially required BiPAP and was later transitioned back to oxygen via nasal canula at 3 lpm.   Patient reports being tired, fatigued. He has had multiple visitors.  Home health has started. Patient endorses shortness of breath with exertion, sometimes at rest. He is wearing oxygen via nasal canula at 3 lpm. Denies pain, chest pain. No edema to LE. He is needed assistance with adl's. He is using walker for ambulation. Fair appetite reported. A 10-point review of systems is negative, except for the pertinent positives and negatives detailed in the HPI.    History obtained from review of EMR, discussion with primary team, and interview with family, facility staff/caregiver and/or [name].  I reviewed available labs, medications, imaging, studies and related documents from the EMR.  Records reviewed and summarized above.    Physical Exam: pulse 88, resp 20, sats 97 at 3lpm, b/p 90/62  Constitutional: NAD General: frail appearing, ill appearing EYES: anicteric sclera, lids intact, no discharge  ENMT: intact hearing, oral mucous membranes moist, dentition intact CV: S1S2, RRR, no LE edema Pulmonary: LCTA, no increased work of breathing, no cough, room air Abdomen: normo-active BS + 4 quadrants, soft and non tender GU: deferred MSK: moves all extremities, ambulatory with walker Skin: warm and dry, no rashes or wounds on visible skin Neuro: generalized weakness, A& O x 3, forgetful Psych: non-anxious affect Hem/lymph/immuno: no widespread bruising   Thank you for the opportunity to participate in the care of Mr. Walls.  The palliative care team will continue to follow. Please call our office at [phone] if we can be of additional assistance.   Ezekiel Slocumb, NP   COVID-19 PATIENT SCREENING TOOL Asked and negative response unless otherwise noted:   Have you had symptoms of covid, tested positive or been in contact with someone with symptoms/positive test in the past 5-10 days? No

## 2021-11-01 ENCOUNTER — Encounter: Payer: Medicare Other | Admitting: Physical Therapy

## 2021-11-01 DIAGNOSIS — J9601 Acute respiratory failure with hypoxia: Secondary | ICD-10-CM | POA: Diagnosis not present

## 2021-11-02 ENCOUNTER — Ambulatory Visit: Payer: Medicare Other | Admitting: Family

## 2021-11-02 DIAGNOSIS — I5023 Acute on chronic systolic (congestive) heart failure: Secondary | ICD-10-CM | POA: Diagnosis not present

## 2021-11-02 DIAGNOSIS — I110 Hypertensive heart disease with heart failure: Secondary | ICD-10-CM | POA: Diagnosis not present

## 2021-11-02 DIAGNOSIS — J9621 Acute and chronic respiratory failure with hypoxia: Secondary | ICD-10-CM | POA: Diagnosis not present

## 2021-11-02 DIAGNOSIS — C3491 Malignant neoplasm of unspecified part of right bronchus or lung: Secondary | ICD-10-CM | POA: Diagnosis not present

## 2021-11-02 DIAGNOSIS — E785 Hyperlipidemia, unspecified: Secondary | ICD-10-CM | POA: Diagnosis not present

## 2021-11-02 DIAGNOSIS — E119 Type 2 diabetes mellitus without complications: Secondary | ICD-10-CM | POA: Diagnosis not present

## 2021-11-04 ENCOUNTER — Encounter: Payer: Self-pay | Admitting: Family Medicine

## 2021-11-05 ENCOUNTER — Emergency Department: Payer: Medicare Other

## 2021-11-05 ENCOUNTER — Encounter: Payer: Medicare Other | Admitting: Physical Therapy

## 2021-11-05 ENCOUNTER — Inpatient Hospital Stay
Admission: EM | Admit: 2021-11-05 | Discharge: 2021-11-09 | DRG: 281 | Disposition: A | Discharge status: Home Health | Payer: Medicare Other | Attending: Internal Medicine | Admitting: Internal Medicine

## 2021-11-05 ENCOUNTER — Other Ambulatory Visit: Payer: Self-pay

## 2021-11-05 ENCOUNTER — Encounter: Payer: Self-pay | Admitting: Internal Medicine

## 2021-11-05 ENCOUNTER — Telehealth: Payer: Self-pay | Admitting: Family Medicine

## 2021-11-05 ENCOUNTER — Telehealth: Payer: Self-pay

## 2021-11-05 DIAGNOSIS — G510 Bell's palsy: Secondary | ICD-10-CM | POA: Diagnosis present

## 2021-11-05 DIAGNOSIS — R32 Unspecified urinary incontinence: Secondary | ICD-10-CM | POA: Diagnosis present

## 2021-11-05 DIAGNOSIS — I5022 Chronic systolic (congestive) heart failure: Secondary | ICD-10-CM | POA: Diagnosis not present

## 2021-11-05 DIAGNOSIS — F32A Depression, unspecified: Secondary | ICD-10-CM | POA: Diagnosis present

## 2021-11-05 DIAGNOSIS — E785 Hyperlipidemia, unspecified: Secondary | ICD-10-CM | POA: Diagnosis present

## 2021-11-05 DIAGNOSIS — J9811 Atelectasis: Secondary | ICD-10-CM | POA: Diagnosis present

## 2021-11-05 DIAGNOSIS — R34 Anuria and oliguria: Secondary | ICD-10-CM | POA: Diagnosis not present

## 2021-11-05 DIAGNOSIS — Z6826 Body mass index (BMI) 26.0-26.9, adult: Secondary | ICD-10-CM | POA: Diagnosis not present

## 2021-11-05 DIAGNOSIS — E440 Moderate protein-calorie malnutrition: Secondary | ICD-10-CM | POA: Diagnosis present

## 2021-11-05 DIAGNOSIS — R0600 Dyspnea, unspecified: Secondary | ICD-10-CM | POA: Diagnosis not present

## 2021-11-05 DIAGNOSIS — E1122 Type 2 diabetes mellitus with diabetic chronic kidney disease: Secondary | ICD-10-CM | POA: Diagnosis not present

## 2021-11-05 DIAGNOSIS — I252 Old myocardial infarction: Secondary | ICD-10-CM | POA: Diagnosis not present

## 2021-11-05 DIAGNOSIS — I2510 Atherosclerotic heart disease of native coronary artery without angina pectoris: Secondary | ICD-10-CM | POA: Diagnosis present

## 2021-11-05 DIAGNOSIS — J439 Emphysema, unspecified: Secondary | ICD-10-CM | POA: Diagnosis present

## 2021-11-05 DIAGNOSIS — Z882 Allergy status to sulfonamides status: Secondary | ICD-10-CM

## 2021-11-05 DIAGNOSIS — K219 Gastro-esophageal reflux disease without esophagitis: Secondary | ICD-10-CM | POA: Diagnosis present

## 2021-11-05 DIAGNOSIS — R0689 Other abnormalities of breathing: Secondary | ICD-10-CM | POA: Diagnosis not present

## 2021-11-05 DIAGNOSIS — C3491 Malignant neoplasm of unspecified part of right bronchus or lung: Secondary | ICD-10-CM | POA: Diagnosis present

## 2021-11-05 DIAGNOSIS — Z66 Do not resuscitate: Secondary | ICD-10-CM | POA: Diagnosis present

## 2021-11-05 DIAGNOSIS — J9611 Chronic respiratory failure with hypoxia: Secondary | ICD-10-CM | POA: Diagnosis present

## 2021-11-05 DIAGNOSIS — R739 Hyperglycemia, unspecified: Secondary | ICD-10-CM | POA: Diagnosis not present

## 2021-11-05 DIAGNOSIS — Z8249 Family history of ischemic heart disease and other diseases of the circulatory system: Secondary | ICD-10-CM

## 2021-11-05 DIAGNOSIS — N281 Cyst of kidney, acquired: Secondary | ICD-10-CM | POA: Diagnosis not present

## 2021-11-05 DIAGNOSIS — I428 Other cardiomyopathies: Secondary | ICD-10-CM | POA: Diagnosis present

## 2021-11-05 DIAGNOSIS — R0789 Other chest pain: Secondary | ICD-10-CM | POA: Diagnosis not present

## 2021-11-05 DIAGNOSIS — I214 Non-ST elevation (NSTEMI) myocardial infarction: Secondary | ICD-10-CM | POA: Diagnosis present

## 2021-11-05 DIAGNOSIS — I5023 Acute on chronic systolic (congestive) heart failure: Secondary | ICD-10-CM

## 2021-11-05 DIAGNOSIS — Z20822 Contact with and (suspected) exposure to covid-19: Secondary | ICD-10-CM | POA: Diagnosis present

## 2021-11-05 DIAGNOSIS — Z7901 Long term (current) use of anticoagulants: Secondary | ICD-10-CM

## 2021-11-05 DIAGNOSIS — Z8546 Personal history of malignant neoplasm of prostate: Secondary | ICD-10-CM | POA: Diagnosis not present

## 2021-11-05 DIAGNOSIS — Z83438 Family history of other disorder of lipoprotein metabolism and other lipidemia: Secondary | ICD-10-CM

## 2021-11-05 DIAGNOSIS — N183 Chronic kidney disease, stage 3 unspecified: Secondary | ICD-10-CM | POA: Diagnosis not present

## 2021-11-05 DIAGNOSIS — R0603 Acute respiratory distress: Secondary | ICD-10-CM | POA: Diagnosis not present

## 2021-11-05 DIAGNOSIS — Z923 Personal history of irradiation: Secondary | ICD-10-CM

## 2021-11-05 DIAGNOSIS — I25118 Atherosclerotic heart disease of native coronary artery with other forms of angina pectoris: Secondary | ICD-10-CM | POA: Diagnosis not present

## 2021-11-05 DIAGNOSIS — I442 Atrioventricular block, complete: Secondary | ICD-10-CM | POA: Diagnosis present

## 2021-11-05 DIAGNOSIS — I130 Hypertensive heart and chronic kidney disease with heart failure and stage 1 through stage 4 chronic kidney disease, or unspecified chronic kidney disease: Secondary | ICD-10-CM | POA: Diagnosis present

## 2021-11-05 DIAGNOSIS — I272 Pulmonary hypertension, unspecified: Secondary | ICD-10-CM | POA: Diagnosis present

## 2021-11-05 DIAGNOSIS — R079 Chest pain, unspecified: Secondary | ICD-10-CM | POA: Diagnosis not present

## 2021-11-05 DIAGNOSIS — F0393 Unspecified dementia, unspecified severity, with mood disturbance: Secondary | ICD-10-CM | POA: Diagnosis present

## 2021-11-05 DIAGNOSIS — R0902 Hypoxemia: Secondary | ICD-10-CM | POA: Diagnosis not present

## 2021-11-05 DIAGNOSIS — N1831 Chronic kidney disease, stage 3a: Secondary | ICD-10-CM | POA: Diagnosis present

## 2021-11-05 DIAGNOSIS — R778 Other specified abnormalities of plasma proteins: Secondary | ICD-10-CM | POA: Diagnosis not present

## 2021-11-05 DIAGNOSIS — K591 Functional diarrhea: Secondary | ICD-10-CM | POA: Diagnosis not present

## 2021-11-05 DIAGNOSIS — I5042 Chronic combined systolic (congestive) and diastolic (congestive) heart failure: Secondary | ICD-10-CM | POA: Diagnosis present

## 2021-11-05 DIAGNOSIS — R197 Diarrhea, unspecified: Secondary | ICD-10-CM | POA: Diagnosis present

## 2021-11-05 DIAGNOSIS — Z950 Presence of cardiac pacemaker: Secondary | ICD-10-CM

## 2021-11-05 DIAGNOSIS — G4733 Obstructive sleep apnea (adult) (pediatric): Secondary | ICD-10-CM | POA: Diagnosis present

## 2021-11-05 LAB — CBC WITH DIFFERENTIAL/PLATELET
Abs Immature Granulocytes: 0.03 10*3/uL (ref 0.00–0.07)
Basophils Absolute: 0 10*3/uL (ref 0.0–0.1)
Basophils Relative: 1 %
Eosinophils Absolute: 0.1 10*3/uL (ref 0.0–0.5)
Eosinophils Relative: 2 %
HCT: 33.5 % — ABNORMAL LOW (ref 39.0–52.0)
Hemoglobin: 10.9 g/dL — ABNORMAL LOW (ref 13.0–17.0)
Immature Granulocytes: 1 %
Lymphocytes Relative: 15 %
Lymphs Abs: 0.8 10*3/uL (ref 0.7–4.0)
MCH: 27.7 pg (ref 26.0–34.0)
MCHC: 32.5 g/dL (ref 30.0–36.0)
MCV: 85 fL (ref 80.0–100.0)
Monocytes Absolute: 0.5 10*3/uL (ref 0.1–1.0)
Monocytes Relative: 8 %
Neutro Abs: 4 10*3/uL (ref 1.7–7.7)
Neutrophils Relative %: 73 %
Platelets: 269 10*3/uL (ref 150–400)
RBC: 3.94 MIL/uL — ABNORMAL LOW (ref 4.22–5.81)
RDW: 16.6 % — ABNORMAL HIGH (ref 11.5–15.5)
WBC: 5.4 10*3/uL (ref 4.0–10.5)
nRBC: 0 % (ref 0.0–0.2)

## 2021-11-05 LAB — BETA-HYDROXYBUTYRIC ACID: Beta-Hydroxybutyric Acid: 0.11 mmol/L (ref 0.05–0.27)

## 2021-11-05 LAB — COMPREHENSIVE METABOLIC PANEL
ALT: 16 U/L (ref 0–44)
AST: 23 U/L (ref 15–41)
Albumin: 3.2 g/dL — ABNORMAL LOW (ref 3.5–5.0)
Alkaline Phosphatase: 52 U/L (ref 38–126)
Anion gap: 10 (ref 5–15)
BUN: 32 mg/dL — ABNORMAL HIGH (ref 8–23)
CO2: 16 mmol/L — ABNORMAL LOW (ref 22–32)
Calcium: 9.3 mg/dL (ref 8.9–10.3)
Chloride: 108 mmol/L (ref 98–111)
Creatinine, Ser: 1.42 mg/dL — ABNORMAL HIGH (ref 0.61–1.24)
GFR, Estimated: 48 mL/min — ABNORMAL LOW (ref >60)
Glucose, Bld: 245 mg/dL — ABNORMAL HIGH (ref 70–99)
Potassium: 5.1 mmol/L (ref 3.5–5.1)
Sodium: 134 mmol/L — ABNORMAL LOW (ref 135–145)
Total Bilirubin: 0.9 mg/dL (ref 0.3–1.2)
Total Protein: 6.8 g/dL (ref 6.5–8.1)

## 2021-11-05 LAB — RESP PANEL BY RT-PCR (FLU A&B, COVID) ARPGX2
Influenza A by PCR: NEGATIVE
Influenza B by PCR: NEGATIVE
SARS Coronavirus 2 by RT PCR: NEGATIVE

## 2021-11-05 LAB — BLOOD GAS, VENOUS
Acid-base deficit: 6.9 mmol/L — ABNORMAL HIGH (ref 0.0–2.0)
Bicarbonate: 17.3 mmol/L — ABNORMAL LOW (ref 20.0–28.0)
O2 Saturation: 82.9 %
Patient temperature: 37
pCO2, Ven: 30 mmHg — ABNORMAL LOW (ref 44.0–60.0)
pH, Ven: 7.37 (ref 7.250–7.430)
pO2, Ven: 49 mmHg — ABNORMAL HIGH (ref 32.0–45.0)

## 2021-11-05 LAB — APTT
aPTT: 48 seconds — ABNORMAL HIGH (ref 24–36)
aPTT: 147 seconds — ABNORMAL HIGH (ref 24–36)

## 2021-11-05 LAB — CBG MONITORING, ED: Glucose-Capillary: 238 mg/dL — ABNORMAL HIGH (ref 70–99)

## 2021-11-05 LAB — TROPONIN I (HIGH SENSITIVITY)
Troponin I (High Sensitivity): 39 ng/L — ABNORMAL HIGH (ref <18)
Troponin I (High Sensitivity): 100 ng/L (ref <18)
Troponin I (High Sensitivity): 1077 ng/L (ref <18)

## 2021-11-05 LAB — PROTIME-INR
INR: 1.2 (ref 0.8–1.2)
Prothrombin Time: 15.6 seconds — ABNORMAL HIGH (ref 11.4–15.2)

## 2021-11-05 LAB — HEPARIN LEVEL (UNFRACTIONATED): Heparin Unfractionated: >1.1 IU/mL — ABNORMAL HIGH (ref 0.30–0.70)

## 2021-11-05 MED ORDER — SPIRONOLACTONE 25 MG PO TABS
25.0000 mg | ORAL_TABLET | Freq: Every day | ORAL | Status: DC
  Administered 2021-11-06 – 2021-11-07 (×2): 25 mg via ORAL
  Filled 2021-11-05 (×3): qty 1

## 2021-11-05 MED ORDER — PANTOPRAZOLE SODIUM 40 MG PO TBEC
40.0000 mg | DELAYED_RELEASE_TABLET | Freq: Every day | ORAL | Status: DC
  Administered 2021-11-05 – 2021-11-08 (×4): 40 mg via ORAL
  Filled 2021-11-05 (×4): qty 1

## 2021-11-05 MED ORDER — SACUBITRIL-VALSARTAN 49-51 MG PO TABS
1.0000 | ORAL_TABLET | Freq: Two times a day (BID) | ORAL | Status: DC
  Administered 2021-11-05 – 2021-11-09 (×8): 1 via ORAL
  Filled 2021-11-05 (×10): qty 1

## 2021-11-05 MED ORDER — FUROSEMIDE 20 MG PO TABS
20.0000 mg | ORAL_TABLET | Freq: Every day | ORAL | Status: DC
  Administered 2021-11-05 – 2021-11-09 (×4): 20 mg via ORAL
  Filled 2021-11-05 (×5): qty 1

## 2021-11-05 MED ORDER — ONDANSETRON HCL 4 MG/2ML IJ SOLN: 4.0000 mg | Freq: Four times a day (QID) | INTRAMUSCULAR | Status: DC | PRN

## 2021-11-05 MED ORDER — INSULIN ASPART 100 UNIT/ML IJ SOLN
0.0000 [IU] | Freq: Three times a day (TID) | INTRAMUSCULAR | Status: DC
  Administered 2021-11-05: 5 [IU] via SUBCUTANEOUS
  Administered 2021-11-06: 2 [IU] via SUBCUTANEOUS
  Administered 2021-11-06: 3 [IU] via SUBCUTANEOUS
  Administered 2021-11-06 – 2021-11-07 (×2): 2 [IU] via SUBCUTANEOUS
  Administered 2021-11-07: 8 [IU] via SUBCUTANEOUS
  Administered 2021-11-07: 3 [IU] via SUBCUTANEOUS
  Administered 2021-11-08: 10:00:00 2 [IU] via SUBCUTANEOUS
  Administered 2021-11-08 (×2): 3 [IU] via SUBCUTANEOUS
  Administered 2021-11-09: 2 [IU] via SUBCUTANEOUS
  Administered 2021-11-09: 3 [IU] via SUBCUTANEOUS
  Filled 2021-11-05 (×12): qty 1

## 2021-11-05 MED ORDER — ALBUTEROL SULFATE HFA 108 (90 BASE) MCG/ACT IN AERS: 2.0000 | INHALATION_SPRAY | Freq: Four times a day (QID) | RESPIRATORY_TRACT | Status: DC | PRN

## 2021-11-05 MED ORDER — HEPARIN (PORCINE) 25000 UT/250ML-% IV SOLN
900.0000 [IU]/h | INTRAVENOUS | Status: AC
  Administered 2021-11-05 – 2021-11-07 (×3): 900 [IU]/h via INTRAVENOUS
  Filled 2021-11-05 (×2): qty 250

## 2021-11-05 MED ORDER — ADULT MULTIVITAMIN W/MINERALS CH
1.0000 | ORAL_TABLET | Freq: Every day | ORAL | Status: DC
  Administered 2021-11-05 – 2021-11-09 (×5): 1 via ORAL
  Filled 2021-11-05 (×5): qty 1

## 2021-11-05 MED ORDER — FLUTICASONE FUROATE-VILANTEROL 100-25 MCG/ACT IN AEPB
1.0000 | INHALATION_SPRAY | Freq: Every day | RESPIRATORY_TRACT | Status: DC
  Administered 2021-11-08 – 2021-11-09 (×2): 1 via RESPIRATORY_TRACT
  Filled 2021-11-05 (×2): qty 28

## 2021-11-05 MED ORDER — HEPARIN BOLUS VIA INFUSION
4000.0000 [IU] | Freq: Once | INTRAVENOUS | Status: AC
  Administered 2021-11-05: 4000 [IU] via INTRAVENOUS
  Filled 2021-11-05: qty 4000

## 2021-11-05 MED ORDER — SIMVASTATIN 20 MG PO TABS
40.0000 mg | ORAL_TABLET | Freq: Every day | ORAL | Status: DC
  Administered 2021-11-05 – 2021-11-08 (×4): 40 mg via ORAL
  Filled 2021-11-05: qty 2
  Filled 2021-11-05: qty 4
  Filled 2021-11-05 (×2): qty 2

## 2021-11-05 MED ORDER — HEPARIN (PORCINE) 25000 UT/250ML-% IV SOLN
900.0000 [IU]/h | INTRAVENOUS | Status: DC
Start: 2021-11-05 — End: 2021-11-05
  Administered 2021-11-05: 1150 [IU]/h via INTRAVENOUS
  Filled 2021-11-05: qty 250

## 2021-11-05 MED ORDER — NITROGLYCERIN 0.4 MG SL SUBL: 0.4000 mg | SUBLINGUAL_TABLET | SUBLINGUAL | Status: DC | PRN

## 2021-11-05 MED ORDER — ACETAMINOPHEN 325 MG PO TABS: 650.0000 mg | ORAL_TABLET | ORAL | Status: DC | PRN

## 2021-11-05 MED ORDER — ISOSORBIDE MONONITRATE ER 60 MG PO TB24
30.0000 mg | ORAL_TABLET | Freq: Every day | ORAL | Status: DC
  Administered 2021-11-05: 30 mg via ORAL
  Filled 2021-11-05: qty 1

## 2021-11-05 MED ORDER — ASPIRIN 300 MG RE SUPP: 300.0000 mg | RECTAL | Status: AC

## 2021-11-05 MED ORDER — SODIUM BICARBONATE 650 MG PO TABS
650.0000 mg | ORAL_TABLET | Freq: Three times a day (TID) | ORAL | Status: DC
  Administered 2021-11-05 – 2021-11-09 (×11): 650 mg via ORAL
  Filled 2021-11-05 (×14): qty 1

## 2021-11-05 MED ORDER — ASPIRIN EC 81 MG PO TBEC: 81.0000 mg | DELAYED_RELEASE_TABLET | Freq: Every day | ORAL | Status: DC

## 2021-11-05 MED ORDER — APIXABAN 5 MG PO TABS: 5.0000 mg | ORAL_TABLET | Freq: Two times a day (BID) | ORAL | Status: DC

## 2021-11-05 MED ORDER — FLUTICASONE-UMECLIDIN-VILANT 100-62.5-25 MCG/ACT IN AEPB: 1.0000 | INHALATION_SPRAY | Freq: Every day | RESPIRATORY_TRACT | Status: DC

## 2021-11-05 MED ORDER — MONTELUKAST SODIUM 10 MG PO TABS: 10.0000 mg | ORAL_TABLET | Freq: Every evening | ORAL | Status: DC | PRN

## 2021-11-05 MED ORDER — ACETAMINOPHEN 500 MG PO TABS: 1000.0000 mg | ORAL_TABLET | Freq: Four times a day (QID) | ORAL | Status: DC | PRN

## 2021-11-05 MED ORDER — ASPIRIN 81 MG PO CHEW
324.0000 mg | CHEWABLE_TABLET | ORAL | Status: AC
  Filled 2021-11-05: qty 4

## 2021-11-05 MED ORDER — EZETIMIBE 10 MG PO TABS
10.0000 mg | ORAL_TABLET | Freq: Every day | ORAL | Status: DC
  Administered 2021-11-06 – 2021-11-09 (×4): 10 mg via ORAL
  Filled 2021-11-05 (×5): qty 1

## 2021-11-05 MED ORDER — SERTRALINE HCL 50 MG PO TABS
50.0000 mg | ORAL_TABLET | Freq: Every day | ORAL | Status: DC
  Administered 2021-11-05 – 2021-11-08 (×4): 50 mg via ORAL
  Filled 2021-11-05 (×4): qty 1

## 2021-11-05 MED ORDER — ALBUTEROL SULFATE (2.5 MG/3ML) 0.083% IN NEBU: 2.5000 mg | INHALATION_SOLUTION | Freq: Four times a day (QID) | RESPIRATORY_TRACT | Status: DC | PRN

## 2021-11-05 MED ORDER — LORATADINE 10 MG PO TABS
10.0000 mg | ORAL_TABLET | Freq: Every day | ORAL | Status: DC
  Administered 2021-11-05 – 2021-11-09 (×5): 10 mg via ORAL
  Filled 2021-11-05 (×5): qty 1

## 2021-11-05 MED ORDER — VITAMIN D3 25 MCG (1000 UNIT) PO TABS
1000.0000 [IU] | ORAL_TABLET | Freq: Every day | ORAL | Status: DC
  Administered 2021-11-05 – 2021-11-09 (×5): 1000 [IU] via ORAL
  Filled 2021-11-05 (×9): qty 1

## 2021-11-05 MED ORDER — AZITHROMYCIN 250 MG PO TABS
250.0000 mg | ORAL_TABLET | ORAL | Status: DC
  Administered 2021-11-05 – 2021-11-09 (×3): 250 mg via ORAL
  Filled 2021-11-05 (×3): qty 1

## 2021-11-05 MED ORDER — ASPIRIN EC 81 MG PO TBEC
81.0000 mg | DELAYED_RELEASE_TABLET | Freq: Every day | ORAL | Status: DC
  Administered 2021-11-06 – 2021-11-09 (×4): 81 mg via ORAL
  Filled 2021-11-05 (×4): qty 1

## 2021-11-05 MED ORDER — UMECLIDINIUM BROMIDE 62.5 MCG/ACT IN AEPB
1.0000 | INHALATION_SPRAY | Freq: Every day | RESPIRATORY_TRACT | Status: DC
  Administered 2021-11-08 – 2021-11-09 (×2): 1 via RESPIRATORY_TRACT
  Filled 2021-11-05 (×2): qty 7

## 2021-11-05 MED ORDER — METOPROLOL SUCCINATE ER 25 MG PO TB24
25.0000 mg | ORAL_TABLET | Freq: Every day | ORAL | Status: DC
  Administered 2021-11-05 – 2021-11-08 (×4): 25 mg via ORAL
  Filled 2021-11-05 (×4): qty 1

## 2021-11-05 MED ORDER — NITROGLYCERIN 0.4 MG SL SUBL
0.4000 mg | SUBLINGUAL_TABLET | SUBLINGUAL | Status: DC | PRN
  Administered 2021-11-05: 0.4 mg via SUBLINGUAL
  Filled 2021-11-05: qty 1

## 2021-11-05 MED ORDER — IRON-VITAMIN C 65-125 MG PO TABS: 1.0000 | ORAL_TABLET | Freq: Every day | ORAL | Status: DC

## 2021-11-05 MED ORDER — ASCORBIC ACID 500 MG PO TABS
250.0000 mg | ORAL_TABLET | Freq: Every day | ORAL | Status: DC
  Administered 2021-11-06 – 2021-11-09 (×4): 250 mg via ORAL
  Filled 2021-11-05 (×4): qty 1

## 2021-11-05 MED ORDER — DONEPEZIL HCL 5 MG PO TABS
5.0000 mg | ORAL_TABLET | Freq: Every day | ORAL | Status: DC
  Administered 2021-11-05 – 2021-11-09 (×5): 5 mg via ORAL
  Filled 2021-11-05 (×5): qty 1

## 2021-11-05 MED ORDER — FERROUS SULFATE 325 (65 FE) MG PO TABS
325.0000 mg | ORAL_TABLET | Freq: Every day | ORAL | Status: DC
  Administered 2021-11-06 – 2021-11-09 (×4): 325 mg via ORAL
  Filled 2021-11-05 (×4): qty 1

## 2021-11-05 MED ORDER — MEGESTROL ACETATE 20 MG PO TABS
40.0000 mg | ORAL_TABLET | Freq: Two times a day (BID) | ORAL | Status: DC
  Administered 2021-11-05 – 2021-11-09 (×7): 40 mg via ORAL
  Filled 2021-11-05 (×10): qty 2

## 2021-11-05 NOTE — ED Notes (Signed)
Pt assisted with urinal

## 2021-11-05 NOTE — ED Notes (Signed)
Meds given.  Paced rhythm on monitor.  Heparin infusing.  Pt alert.

## 2021-11-05 NOTE — ED Notes (Signed)
Heparin restarted.  Pt alert.

## 2021-11-05 NOTE — Progress Notes (Signed)
ANTICOAGULATION CONSULT NOTE - Initial Consult  Pharmacy Consult for Heparin  Indication: chest pain/ACS  Allergies  Allergen Reactions   Sulfa Antibiotics Rash    Patient Measurements: Height: 5\' 9"  (175.3 cm) Weight: 81.6 kg (179 lb 14.4 oz) IBW/kg (Calculated) : 70.7 Heparin Dosing Weight: 81.6 kg   Vital Signs: BP: 125/67 (11/14 1522) Pulse Rate: 83 (11/14 1524)  Labs: Recent Labs    11/05/21 0315 11/05/21 0503 11/05/21 0710 11/05/21 1543  HGB 10.9*  --   --   --   HCT 33.5*  --   --   --   PLT 269  --   --   --   APTT  --   --  48* 147*  LABPROT  --   --  15.6*  --   INR  --   --  1.2  --   HEPARINUNFRC  --   --  >1.10*  --   CREATININE 1.42*  --   --   --   TROPONINIHS 39* 100*  --  1,077*     Estimated Creatinine Clearance: 36 mL/min (A) (by C-G formula based on SCr of 1.42 mg/dL (H)).   Medical History: Past Medical History:  Diagnosis Date   Arthritis    Bell palsy    Bell's palsy 04/12/2015   Cancer [facility])    prostate and skin   Chronic combined systolic and diastolic CHF, NYHA class 1 ([place])    a. 07/2014 Echo: EF 35-40%, Gr 1 DD.   Complete heart block ([place])    a. 11/2010 s/p SJM 2210 Accent DC PPM, ser# [id number].   Depression    Diabetes mellitus without complication ([place])    Fall 11-10-14   GERD (gastroesophageal reflux disease)    History of prostate cancer    Hyperlipidemia    Hypertension    LBBB (left bundle branch block)    Left-sided Bell's palsy    Lung cancer ([place]) 2016   NICM (nonischemic cardiomyopathy) ([place])    a. 07/2014 Echo: EF 35-40%, mid-apicalanteroseptal DK, Gr 1 DD, mild-mod dil LA.   Non-obstructive CAD    a. 07/2014 Abnl MV;  b. 08/2014 Cath: LM nl, LAD 30p, RI 40p, LCX nl, OM1 40, RCA dominant 30p, 70d-->Med Rx.   Poor balance    Presence of permanent cardiac pacemaker    Sleep apnea    a. cpap   Vertigo    WPW (Wolff-Parkinson-White syndrome)    a. S/P RFCA 1991.    Medications:  (Not in a hospital  admission)  Assessment: Pharmacy consulted to dose heparin in this 87 year old male admitted with ACS/NSTEMI.  Pt was on Eliquis 5 mg PO BID at home, uncertain of last dose.    11/14 1543 aPTT supratherapeutic - spoke with phlebotomist who drew level and confirmed lab drawn correctly  Goal of Therapy:  Heparin level 0.3-0.7 units/ml once correlating with aPTT aPTT 66 - 102 seconds Monitor platelets by anticoagulation protocol: Yes   Plan:  aPTT supratherapeutic. Will hold heparin for 1 hr and decrease heparin rate to 900 units/hr.    Will use aPTT to guide dosing until HL and aPTT correlate. Recheck aPTT 8 hrs after restart Recheck HL with AM albs  Larico Dimock O Lean Jaeger 11/05/2021,5:04 PM

## 2021-11-05 NOTE — ED Provider Notes (Signed)
[facility] Emergency Department Provider Note  ____________________________________________   Event Date/Time   First MD Initiated Contact with Patient 11/05/21 0302     (approximate)  I have reviewed the triage vital signs and the nursing notes.   HISTORY  Chief Complaint Chest Pain    HPI Howard [name] is a 87 y.o. male with CHF, diabetes, known left bundle branch block, non-small cell lung cancer status post radiation, on Eliquis on 3 L of oxygen chronically for emphysema who comes in with chest pain.  Patient reports he had a heart attack 2 weeks ago.  Patient states that he woke up this morning at 230 with sudden onset chest pain that was just on top of his chest, constant, nothing made it better or worse.  He states that he was given a full dose aspirin and nitro and his pain is now relieved.  He denies any shortness of breath associated with it or abdominal pain.  On review of records patient was seen had a negative CT PE but troponin was 12,000.  Patient was initially requiring BiPAP and then transition wean back down to 3 L.  They recommended medical management only.  EF was 20% with global hypokinesis and he was diuresed with IV Lasix.           Past Medical History:  Diagnosis Date   Arthritis    Bell palsy    Bell's palsy 04/12/2015   Cancer [facility])    prostate and skin   Chronic combined systolic and diastolic CHF, NYHA class 1 ([place])    a. 07/2014 Echo: EF 35-40%, Gr 1 DD.   Complete heart block ([place])    a. 11/2010 s/p SJM 2210 Accent DC PPM, ser# [id number].   Depression    Diabetes mellitus without complication ([place])    Fall 11-10-14   GERD (gastroesophageal reflux disease)    History of prostate cancer    Hyperlipidemia    Hypertension    LBBB (left bundle branch block)    Left-sided Bell's palsy    Lung cancer ([place]) 2016   NICM (nonischemic cardiomyopathy) ([place])    a. 07/2014 Echo: EF 35-40%, mid-apicalanteroseptal DK, Gr 1 DD,  mild-mod dil LA.   Non-obstructive CAD    a. 07/2014 Abnl MV;  b. 08/2014 Cath: LM nl, LAD 30p, RI 40p, LCX nl, OM1 40, RCA dominant 30p, 70d-->Med Rx.   Poor balance    Presence of permanent cardiac pacemaker    Sleep apnea    a. cpap   Vertigo    WPW (Wolff-Parkinson-White syndrome)    a. S/P RFCA 1991.    Patient Active Problem List   Diagnosis Date Noted   Abnormal CXR    Pulmonary infiltrate in right lung on CXR    Hypoxia    Acute on chronic HFrEF (heart failure with reduced ejection fraction) ([place]) 10/24/2021   Acute respiratory failure ([place])    Normocytic anemia 09/18/2021   Other fatigue 29/52/8413   Chronic systolic congestive heart failure ([place]) 07/18/2021   Goals of care, counseling/discussion 04/16/2021   Mediastinal lymphadenopathy 04/16/2021   Heart failure with reduced ejection fraction ([place])    AAA (abdominal aortic aneurysm) without rupture 12/10/2019   Renal artery stenosis ([place]) 12/10/2019   PAD (peripheral artery disease) ([place]) 12/10/2019   Thromboembolism ([place]) 08/30/2019   Atherosclerosis of native arteries of extremity with intermittent claudication ([place]) 08/23/2019   Swelling of limb 08/17/2019   Pain in limb 08/17/2019   NICM (  nonischemic cardiomyopathy) ([place]) 07/01/2019   CHF (congestive heart failure) ([place]) 07/01/2019   Malignant neoplasm of right lung ([place]) 04/18/2016   CAD (coronary artery disease) 04/01/2016   Adenocarcinoma ([place]) 04/01/2016   Skin cyst 12/26/2015   GERD (gastroesophageal reflux disease) 06/16/2015   Abscess of back 06/06/2015   Vertigo 03/27/2015   Carpal tunnel syndrome 10/28/2014   Status post cholecystectomy 09/29/2014   Disease of digestive tract 09/29/2014   Cardiomyopathy ([place]) 09/26/2014   Type 2 diabetes mellitus without complications ([place])    Sleep apnea    Spinal stenosis, lumbar region, with neurogenic claudication 06/07/2014   Lumbar stenosis with neurogenic claudication 06/07/2014   Abnormal gait 08/20/2012    H/O total knee replacement 08/20/2012   Arthritis of knee, degenerative 08/20/2012   Pacemaker-St.Jude 08/03/2012   Cardiac conduction disorder 06/19/2012   Acid reflux 06/18/2012   Nodal rhythm disorder 06/18/2012   Triggering of digit 03/25/2012   Hyperlipidemia 12/23/2011   Essential hypertension 03/23/2011   Atrioventricular block, complete ([place]) 03/23/2011   Complete atrioventricular block ([place]) 03/23/2011    Past Surgical History:  Procedure Laterality Date   ABDOMINAL AORTIC ENDOVASCULAR STENT GRAFT  08/25/2019   Procedure: ABDOMINAL AORTIC ENDOVASCULAR STENT GRAFT;  Surgeon: Algernon Huxley, MD;  Location: [facility] ORS;  Service: Vascular;;   ANGIOPLASTY Left 08/25/2019   Procedure: ANGIOPLASTY;  Surgeon: Algernon Huxley, MD;  Location: [facility] ORS;  Service: Vascular;  Laterality: Left;  left SFA and stent placement   APPLICATION OF WOUND VAC Left 06/07/2015   Procedure: APPLICATION OF WOUND VAC;  Surgeon: Robert Bellow, MD;  Location: [facility] ORS;  Service: General;  Laterality: Left;  left upper back   [place]  08/26/2014   Single vessel obstructive CAD   CARPAL TUNNEL RELEASE  04-04-15   [facility]   CATARACT EXTRACTION  07-31-11 and 09-18-11   Catheter ablation  1991   for WPW   cervical fusion     CHOLECYSTECTOMY  09-07-14   ENDARTERECTOMY FEMORAL Left 08/25/2019   Procedure: ENDARTERECTOMY FEMORAL;  Surgeon: Algernon Huxley, MD;  Location: [facility] ORS;  Service: Vascular;  Laterality: Left;  common and produndis    ENDOVASCULAR REPAIR/STENT GRAFT Right 08/25/2019   Procedure: ENDOVASCULAR REPAIR/STENT GRAFT;  Surgeon: Algernon Huxley, MD;  Location: [facility] ORS;  Service: Vascular;  Laterality: Right;  renal artery   HAND SURGERY     right 1993; left 2005   [place] / REPLACE / REMOVE PACEMAKER     INSERTION OF ILIAC STENT Bilateral 08/25/2019   Procedure: INSERTION OF ILIAC STENT;  Surgeon: Algernon Huxley, MD;  Location: [facility] ORS;  Service:  Vascular;  Laterality: Bilateral;   JOINT REPLACEMENT Left 2013   knee   JOINT REPLACEMENT Right 2004   knee   KNEE SURGERY     left knee 1991 and 1992; right knee [place] N/A 08/26/2014   Procedure: LEFT HEART CATHETERIZATION WITH CORONARY ANGIOGRAM;  Surgeon: [name], MD;  Location: [facility] CATH LAB;  Service: Cardiovascular;  Laterality: N/A;   LOWER EXTREMITY ANGIOGRAPHY Left 08/23/2019   Procedure: Lower Extremity Angiography;  Surgeon: Algernon Huxley, MD;  Location: [place] CV LAB;  Service: Cardiovascular;  Laterality: Left;   LUMBAR LAMINECTOMY/DECOMPRESSION MICRODISCECTOMY N/A 06/07/2014   Procedure: LUMBAR FOUR TO FIVE LUMBAR LAMINECTOMY/DECOMPRESSION MICRODISCECTOMY 1 LEVEL;  Surgeon: [name], MD;  Location:  [place] NEURO ORS;  Service: Neurosurgery;  Laterality: N/A;   LUNG BIOPSY Right 2016   Dr [name]   MOHS SURGERY     PACEMAKER INSERTION     PPM-- St Jude 11/30/10 by [name]   PPM GENERATOR CHANGEOUT N/A 07/09/2019   Procedure: PPM GENERATOR CHANGEOUT;  Surgeon: Deboraha Sprang, MD;  Location: [place] CV LAB;  Service: Cardiovascular;  Laterality: N/A;   PROSTATE SURGERY     cancer--1998, prostatectomy   REPLACEMENT TOTAL KNEE     2004   RIGHT/LEFT HEART CATH AND CORONARY ANGIOGRAPHY Bilateral 04/13/2021   Procedure: RIGHT/LEFT HEART CATH AND CORONARY ANGIOGRAPHY;  Surgeon: Wellington Hampshire, MD;  Location: [place] CV LAB;  Service: Cardiovascular;  Laterality: Bilateral;   ruptured disc     1962 and 1998   TEE WITHOUT CARDIOVERSION N/A 09/01/2019   Procedure: TRANSESOPHAGEAL ECHOCARDIOGRAM (TEE);  Surgeon: [name], MD;  Location: [facility] ORS;  Service: Cardiovascular;  Laterality: N/A;   TEMPORARY PACEMAKER N/A 07/09/2019   Procedure: TEMPORARY PACEMAKER;  Surgeon: Deboraha Sprang, MD;  Location: [place] CV LAB;  Service: Cardiovascular;  Laterality: N/A;   TRIGGER FINGER RELEASE  01-24-15   WOUND DEBRIDEMENT  Left 06/07/2015   Procedure: DEBRIDEMENT WOUND;  Surgeon: Robert Bellow, MD;  Location: [facility] ORS;  Service: General;  Laterality: Left;  left upper back    Prior to Admission medications   Medication Sig Start Date End Date Taking? Authorizing Provider  acetaminophen (TYLENOL) 500 MG tablet Take 1,000 mg by mouth every 6 (six) hours as needed for moderate pain.    [provider]  albuterol (VENTOLIN HFA) 108 (90 Base) MCG/ACT inhaler INHALE 2 PUFFS INTO LUNGS EVERY 6 HOURS AS NEEDED FOR WHEEZING OR SHORTNESS OF BREATH 07/23/21   Tyler Pita, MD  aspirin EC 81 MG tablet Take 81 mg by mouth daily.    [provider]  cetirizine (ZYRTEC) 10 MG tablet Take 10 mg by mouth daily.    [provider]  Cholecalciferol (VITAMIN D3) 1000 UNITS CAPS Take 1,000 Units by mouth daily.    [provider]  donepezil (ARICEPT) 5 MG tablet Take 1 tablet (5 mg total) by mouth daily. 10/23/21   Jerrol Banana., MD  ELIQUIS 5 MG TABS tablet TAKE 1 TABLET TWICE A DAY  (SWITCHED FROM PLAVIX) Patient taking differently: Take 5 mg by mouth 2 (two) times daily. 08/28/21   Algernon Huxley, MD  ezetimibe (ZETIA) 10 MG tablet TAKE 1 TABLET DAILY 09/24/21   [name], MD  Fluticasone-Umeclidin-Vilant (TRELEGY ELLIPTA) 100-62.5-25 MCG/INH AEPB Inhale 1 puff into the lungs daily. 02/14/21   Tyler Pita, MD  furosemide (LASIX) 20 MG tablet TAKE 1 TABLET BY MOUTH ONCE DAILY 10/09/21   [name], MD  Iron-Vitamin C (VITRON-C) 65-125 MG TABS Take 1 tablet by mouth daily. 04/16/21   Earlie Server, MD  isosorbide mononitrate (IMDUR) 30 MG 24 hr tablet TAKE 1 TABLET DAILY 02/12/21   [name], MD  megestrol (MEGACE) 40 MG tablet Take 1 tablet (40 mg total) by mouth 2 (two) times daily. 09/18/21   Earlie Server, MD  metFORMIN (GLUCOPHAGE) 500 MG tablet Take 1 tablet (500 mg total) by mouth 2 (two) times daily with a meal. 10/23/21   Jerrol Banana., MD  metoprolol  succinate (TOPROL-XL) 25 MG 24 hr tablet TAKE 1 TABLET AT BEDTIME 09/24/21   Gollan, Kathlene November, MD  montelukast (SINGULAIR) 10  MG tablet Take 10 mg by mouth daily as needed (sneezing/allergies.).    [provider]  Multiple Vitamin (MULTIVITAMIN WITH MINERALS) TABS tablet Take 1 tablet by mouth daily. One-A-Day Multivitamin    [provider]  omeprazole (PRILOSEC) 40 MG capsule TAKE 1 CAPSULE TWICE DAILY AS NEEDED 02/12/21   Chrismon, [name], PA-C  sacubitril-valsartan (ENTRESTO) 49-51 MG Take 1 tablet by mouth 2 (two) times daily. 07/25/21 11/22/21  [name], MD  sertraline (ZOLOFT) 50 MG tablet Take 1 tablet (50 mg total) by mouth daily. 10/23/21   Jerrol Banana., MD  simvastatin (ZOCOR) 40 MG tablet TAKE 1 TABLET AT BEDTIME 03/23/21   [name], MD  SODIUM BICARBONATE PO Take by mouth.    [provider]  spironolactone (ALDACTONE) 25 MG tablet Take 1 tablet (25 mg total) by mouth daily. 10/02/21 09/27/22  [name], MD    Allergies Sulfa antibiotics  Family History  Problem Relation Age of Onset   Heart attack Mother    Hyperlipidemia Mother    CAD Other    Prostate cancer Neg Hx    Cancer Neg Hx     Social History Social History   Tobacco Use   Smoking status: Former    Packs/day: 1.00    Years: 56.00    Pack years: 56.00    Types: Cigarettes    Quit date: 2011    Years since quitting: 11.8   Smokeless tobacco: Never  Vaping Use   Vaping Use: Never used  Substance Use Topics   Alcohol use: No   Drug use: No      Review of Systems Constitutional: No fever/chills Eyes: No visual changes. ENT: No sore throat. Cardiovascular: Positive chest pain Respiratory: Denies shortness of breath. Gastrointestinal: No abdominal pain.  No nausea, no vomiting.  No diarrhea.  No constipation. Genitourinary: Negative for dysuria. Musculoskeletal: Negative for back pain. Skin: Negative for rash. Neurological: Negative for  headaches, focal weakness or numbness. All other ROS negative ____________________________________________   PHYSICAL EXAM:  VITAL SIGNS: Blood pressure (!) 143/75, pulse 95, temperature 98.2 F (36.8 C), temperature source Oral, resp. rate 19, height 5\' 9"  (1.753 m), weight 81.6 kg, SpO2 99 %.   Constitutional: Alert and oriented. Well appearing and in no acute distress. Eyes: Conjunctivae are normal. EOMI. Head: Atraumatic. Nose: No congestion/rhinnorhea. Mouth/Throat: Mucous membranes are moist.   Neck: No stridor. Trachea Midline. FROM Cardiovascular: Normal rate, regular rhythm. Grossly normal heart sounds.  Good peripheral circulation.  Pacemaker noted Respiratory: Normal respiratory effort.  No retractions. Lungs CTAB. Gastrointestinal: Soft and nontender. No distention. No abdominal bruits.  Musculoskeletal: No lower extremity tenderness nor edema.  No joint effusions. Neurologic:  Normal speech and language. No gross focal neurologic deficits are appreciated.  Skin:  Skin is warm, dry and intact. No rash noted. Psychiatric: Mood and affect are normal. Speech and behavior are normal. GU: Deferred   ____________________________________________   LABS (all labs ordered are listed, but only abnormal results are displayed)  Labs Reviewed  CBC WITH DIFFERENTIAL/PLATELET - Abnormal; Notable for the following components:      Result Value   RBC 3.94 (*)    Hemoglobin 10.9 (*)    HCT 33.5 (*)    RDW 16.6 (*)    All other components within normal limits  COMPREHENSIVE METABOLIC PANEL - Abnormal; Notable for the following components:   Sodium 134 (*)    CO2 16 (*)    Glucose, Bld  245 (*)    BUN 32 (*)    Creatinine, Ser 1.42 (*)    Albumin 3.2 (*)    GFR, Estimated 48 (*)    All other components within normal limits  BLOOD GAS, VENOUS - Abnormal; Notable for the following components:   pCO2, Ven 30 (*)    pO2, Ven 49.0 (*)    Bicarbonate 17.3 (*)    Acid-base  deficit 6.9 (*)    All other components within normal limits  TROPONIN I (HIGH SENSITIVITY) - Abnormal; Notable for the following components:   Troponin I (High Sensitivity) 39 (*)    All other components within normal limits  RESP PANEL BY RT-PCR (FLU A&B, COVID) ARPGX2  BETA-HYDROXYBUTYRIC ACID  TROPONIN I (HIGH SENSITIVITY)   ____________________________________________   ED ECG REPORT I, Vanessa Leeper, the attending physician, personally viewed and interpreted this ECG.  I reviewed patient's prior EKGs and he is atrially sensed ventricularly paced therefore has a widened QRS with T wave inversions in 1 and aVL  Repeat EKG is ventricular paced rhythm with similar T wave inversions, negative scar [place]   ____________________________________________  RADIOLOGY Robert Bellow, personally viewed and evaluated these images (plain radiographs) as part of my medical decision making, as well as reviewing the written report by the radiologist.  ED MD interpretation: Possible left atelectasis  Official radiology report(s): DG Chest Portable 1 View  Result Date: 11/05/2021 CLINICAL DATA:  Dyspnea EXAM: PORTABLE CHEST 1 VIEW COMPARISON:  10/28/2021 FINDINGS: Lung volumes are small. Developing left basilar atelectasis or infiltrate. Stable subtle infiltrate within the right mid lung zone. No pneumothorax or pleural effusion. Cardiac size is within normal limits. Left subclavian dual lead pacemaker is unchanged. Pulmonary vascularity is normal. No acute bone abnormality. IMPRESSION: Pulmonary hypoinflation. Developing left basilar atelectasis or infiltrate. Stable right mid lung zone subtle pulmonary infiltrate. Electronically Signed   By: Fidela Salisbury M.D.   On: 11/05/2021 03:34    ____________________________________________   PROCEDURES  Procedure(s) performed (including Critical Care):  .1-3 Lead EKG Interpretation Performed by: Vanessa Dalton, MD Authorized by: Vanessa Spencer, MD      Interpretation: abnormal     ECG rate:  90s   ECG rate assessment: normal     Rhythm: paced     Ectopy: none     Conduction: normal   .Critical Care Performed by: Vanessa Ruskin, MD Authorized by: Vanessa , MD   Critical care provider statement:    Critical care time (minutes):  30   Critical care was necessary to treat or prevent imminent or life-threatening deterioration of the following conditions:  Cardiac failure   Critical care was time spent personally by me on the following activities:  Development of treatment plan with patient or surrogate, discussions with consultants, evaluation of patient's response to treatment, examination of patient, ordering and review of laboratory studies, ordering and review of radiographic studies, ordering and performing treatments and interventions, pulse oximetry, re-evaluation of patient's condition and review of old charts   I assumed direction of critical care for this patient from another provider in my specialty: no     Care discussed with: admitting provider     ____________________________________________   INITIAL IMPRESSION / ASSESSMENT AND PLAN / ED COURSE   Howard [name] was evaluated in Emergency Department on 11/05/2021 for the symptoms described in the history of present illness. He was evaluated in the context of the global COVID-19 pandemic, which necessitated consideration that  the patient might be at risk for infection with the SARS-CoV-2 virus that causes COVID-19. Institutional protocols and algorithms that pertain to the evaluation of patients at risk for COVID-19 are in a state of rapid change based on information released by regulatory bodies including the CDC and federal and state organizations. These policies and algorithms were followed during the patient's care in the ED.    Most Likely DDx:  - ACS given risk factors/age       DDx that was also considered d/t potential to cause harm, but was found less  likely based on history and physical (as detailed above): -PNA (no fevers, cough but CXR to evaluate) -PNX (reassured with equal b/l breath sounds, CXR to evaluate) -Symptomatic anemia (will get H&H) -Pulmonary embolism as no sob at rest, not pleuritic in nature, no hypoxia -Aortic Dissection as no tearing pain and no radiation to the mid back, pulses equal -Pericarditis no rub on exam, EKG changes or hx to suggest dx -Tamponade (no notable SOB, tachycardic, hypotensive) -Esophageal rupture (no h/o diffuse vomitting/no crepitus)   4:52 AM reevaluated patient and denies any chest pain.  Initial troponin is significantly lower than previous.  We will get a repeat to see if it stable.  Patient states that if it stays stable he feels comfortable going home   Reevaluated patient.  Chest pain has come back.  Patient getting another EKG.  Cardio marker has risen therefore will start on heparin for concern for NSTEMI.  Chest x-ray concerning for possible pneumonia versus atelectasis.  Suspect there is more likely atelectasis given no cough, fevers or worsening shortness of breath.  Patient was given another nitro and chest pain again was relieved.       ____________________________________________   FINAL CLINICAL IMPRESSION(S) / ED DIAGNOSES   Final diagnoses:  NSTEMI (non-ST elevated myocardial infarction) ([place])     MEDICATIONS GIVEN DURING THIS VISIT:  Medications  nitroGLYCERIN (NITROSTAT) SL tablet 0.4 mg (0.4 mg Sublingual Given 11/05/21 7014)     ED Discharge Orders     None        Note:  This document was prepared using Dragon voice recognition software and may include unintentional dictation errors.    Vanessa , MD 11/05/21 [phone]

## 2021-11-05 NOTE — ED Triage Notes (Signed)
Pt presents from home via EMS with complaints of CP that woke him up out of his sleep with pressure to the left side of his chest. Pt has a HX of CHF & a pacemaker. En route he received 324mg  of ASA & 1 nitro spray with relief. He wears 3L [place] of O2 at baseline. Pt denies CP at this time.

## 2021-11-05 NOTE — Telephone Encounter (Signed)
Copied from [place] [phone]. Topic: Quick Communication - Home Health Verbal Orders >> Nov 05, 2021 12:30 PM Loma [place] wrote: Caller/Agency: [facility] Number: [phone] Requesting OT/PT/Skilled Nursing/Social Work/Speech Therapy: OT 1 wk 3 Frequency: 1 wk 3

## 2021-11-05 NOTE — Telephone Encounter (Signed)
Copied from [place] [id number]. Topic: Quick Communication - Home Health Verbal Orders >> Nov 05, 2021 11:14 AM Yvette Rack wrote: Caller/Agency: Percell [place] with [facility] Number: [phone] Requesting OT/PT/Skilled Nursing/Social Work/Speech Therapy: PT  Frequency: 2 times a week for 7 weeks and 1 time a week for 1 week

## 2021-11-05 NOTE — H&P (Signed)
History and Physical    Howard [name] [id number] DOB: December 15, 1931 DOA: 11/05/2021  PCP: Jerrol Banana., MD   Patient coming from: Home  I have personally briefly reviewed patient's old medical records in [place]  Chief Complaint: Chest pain  Most of the history was obtained from patient's wife at the bedside as he is hard of hearing.  HPI: Howard [name] is a 87 y.o. male with medical history significant for HFrEF, diabetes mellitus with complications of stage III chronic kidney disease, gastric reflux, hypertension, hyperlipidemia, non-small cell lung cancer s/p radiation, chronic respiratory failure with emphysema, on 3 L of oxygen at home, complete heart block status post pacemaker insertion, CAD, presents to the emergency department with complaint of chest pain that woke him up out of his sleep. Patient's wife states that he woke up in the early hours of the morning with complaints of chest pain/pressure initially midsternal/left anterior chest wall pain which he rated a 5 x 10 in intensity at its worst.  He denied any radiation of the pain and denied having any nausea, no vomiting, no diaphoresis, no palpitations or worsening shortness of breath from baseline. Patient's wife states that since his discharge from the hospital on 11/06 for acute on chronic respiratory failure secondary to pneumonia as well as a non-ST elevation MI he has been increasingly weak and has had to ambulate with a rolling walker to reduce his risk for falls.  She also states that he is incontinent of stool and has had multiple accidents at home. He denies having any fever, no chills, no urinary symptoms, no abdominal pain, no lower extremity swelling, no anorexia, no blurred vision, no focal deficits. VBG 7.3 07/21/48/17.3/82.9 Sodium 134, potassium 5.1, chloride 108, bicarb 16, glucose 245, BUN 32, creatinine 1.42, calcium 9.3, albumin 3.2, AST 23, ALT 16, troponin 39 >> 100, white count 5.4,  hemoglobin 10.9, hematocrit 33.5, MCV 85, RDW 16.6, platelet count 269, PT 15.6, INR 1.2 Respiratory viral panel is negative Chest x-ray reviewed by me shows pulmonary hyperinflation.  Developing left basilar atelectasis or infiltrate. Stable right midlung zone pulmonary infiltrate. Twelve-lead EKG reviewed by me shows atrial sensed ventricular paced rhythm.    ED Course: Patient is an 87 year old male who presents to the ER for evaluation of chest pain.  He has a history of nonischemic cardiomyopathy with last known LVEF of 20 to 25%.  Twelve-lead EKG shows no acute findings. Patient was started on a heparin drip due to uptrending troponin levels. He will be admitted to the hospital for further evaluation.   Review of Systems: As per HPI otherwise all other systems reviewed and negative.    Past Medical History:  Diagnosis Date   Arthritis    Bell palsy    Bell's palsy 04/12/2015   Cancer [facility])    prostate and skin   Chronic combined systolic and diastolic CHF, NYHA class 1 ([place])    a. 07/2014 Echo: EF 35-40%, Gr 1 DD.   Complete heart block ([place])    a. 11/2010 s/p SJM 2210 [employer] DC PPM, ser# [id number].   Depression    Diabetes mellitus without complication [facility])    Fall 11-10-14   GERD (gastroesophageal reflux disease)    History of prostate cancer    Hyperlipidemia    Hypertension    LBBB (left bundle branch block)    Left-sided Bell's palsy    Lung cancer ([place]) 2016   NICM (nonischemic cardiomyopathy) ([place])    a.  07/2014 Echo: EF 35-40%, mid-apicalanteroseptal DK, Gr 1 DD, mild-mod dil LA.   Non-obstructive CAD    a. 07/2014 Abnl MV;  b. 08/2014 Cath: LM nl, LAD 30p, RI 40p, LCX nl, OM1 40, RCA dominant 30p, 70d-->Med Rx.   Poor balance    Presence of permanent cardiac pacemaker    Sleep apnea    a. cpap   Vertigo    WPW (Wolff-Parkinson-White syndrome)    a. S/P RFCA 1991.    Past Surgical History:  Procedure Laterality Date   ABDOMINAL AORTIC ENDOVASCULAR STENT  GRAFT  08/25/2019   Procedure: ABDOMINAL AORTIC ENDOVASCULAR STENT GRAFT;  Surgeon: Algernon Huxley, MD;  Location: [facility] ORS;  Service: Vascular;;   ANGIOPLASTY Left 08/25/2019   Procedure: ANGIOPLASTY;  Surgeon: Algernon Huxley, MD;  Location: [facility] ORS;  Service: Vascular;  Laterality: Left;  left SFA and stent placement   APPLICATION OF WOUND VAC Left 06/07/2015   Procedure: APPLICATION OF WOUND VAC;  Surgeon: Robert Bellow, MD;  Location: [facility] ORS;  Service: General;  Laterality: Left;  left upper back   [place]  08/26/2014   Single vessel obstructive CAD   CARPAL TUNNEL RELEASE  04-04-15   [facility]   CATARACT EXTRACTION  07-31-11 and 09-18-11   Catheter ablation  1991   for WPW   cervical fusion     CHOLECYSTECTOMY  09-07-14   ENDARTERECTOMY FEMORAL Left 08/25/2019   Procedure: ENDARTERECTOMY FEMORAL;  Surgeon: Algernon Huxley, MD;  Location: [facility] ORS;  Service: Vascular;  Laterality: Left;  common and produndis    ENDOVASCULAR REPAIR/STENT GRAFT Right 08/25/2019   Procedure: ENDOVASCULAR REPAIR/STENT GRAFT;  Surgeon: Algernon Huxley, MD;  Location: [facility] ORS;  Service: Vascular;  Laterality: Right;  renal artery   HAND SURGERY     right 1993; left 2005   [place] / REPLACE / REMOVE PACEMAKER     INSERTION OF ILIAC STENT Bilateral 08/25/2019   Procedure: INSERTION OF ILIAC STENT;  Surgeon: Algernon Huxley, MD;  Location: [facility] ORS;  Service: Vascular;  Laterality: Bilateral;   JOINT REPLACEMENT Left 2013   knee   JOINT REPLACEMENT Right 2004   knee   KNEE SURGERY     left knee 1991 and 1992; right knee [place] N/A 08/26/2014   Procedure: LEFT HEART CATHETERIZATION WITH CORONARY ANGIOGRAM;  Surgeon: [name], MD;  Location: [facility] CATH LAB;  Service: Cardiovascular;  Laterality: N/A;   LOWER EXTREMITY ANGIOGRAPHY Left 08/23/2019   Procedure: Lower Extremity Angiography;  Surgeon: Algernon Huxley, MD;   Location: [place] CV LAB;  Service: Cardiovascular;  Laterality: Left;   LUMBAR LAMINECTOMY/DECOMPRESSION MICRODISCECTOMY N/A 06/07/2014   Procedure: LUMBAR FOUR TO FIVE LUMBAR LAMINECTOMY/DECOMPRESSION MICRODISCECTOMY 1 LEVEL;  Surgeon: [name], MD;  Location: [place] [facility] ORS;  Service: Neurosurgery;  Laterality: N/A;   LUNG BIOPSY Right 2016   Dr [name]   MOHS SURGERY     PACEMAKER INSERTION     PPM-- St Jude 11/30/10 by [name]   PPM GENERATOR CHANGEOUT N/A 07/09/2019   Procedure: PPM GENERATOR CHANGEOUT;  Surgeon: Deboraha Sprang, MD;  Location: [place] CV LAB;  Service: Cardiovascular;  Laterality: N/A;   PROSTATE SURGERY     cancer--1998, prostatectomy   REPLACEMENT TOTAL KNEE     2004   RIGHT/LEFT HEART CATH AND CORONARY ANGIOGRAPHY Bilateral 04/13/2021  Procedure: RIGHT/LEFT HEART CATH AND CORONARY ANGIOGRAPHY;  Surgeon: Wellington Hampshire, MD;  Location: [place] CV LAB;  Service: Cardiovascular;  Laterality: Bilateral;   ruptured disc     1962 and 1998   TEE WITHOUT CARDIOVERSION N/A 09/01/2019   Procedure: TRANSESOPHAGEAL ECHOCARDIOGRAM (TEE);  Surgeon: [name], MD;  Location: [facility] ORS;  Service: Cardiovascular;  Laterality: N/A;   TEMPORARY PACEMAKER N/A 07/09/2019   Procedure: TEMPORARY PACEMAKER;  Surgeon: Deboraha Sprang, MD;  Location: [place] CV LAB;  Service: Cardiovascular;  Laterality: N/A;   TRIGGER FINGER RELEASE  01-24-15   WOUND DEBRIDEMENT Left 06/07/2015   Procedure: DEBRIDEMENT WOUND;  Surgeon: Robert Bellow, MD;  Location: [facility] ORS;  Service: General;  Laterality: Left;  left upper back     reports that he quit smoking about 11 years ago. His smoking use included cigarettes. He has a 56.00 pack-year smoking history. He has never used smokeless tobacco. He reports that he does not drink alcohol and does not use drugs.  Allergies  Allergen Reactions   Sulfa Antibiotics Rash    Family History  Problem Relation Age of Onset   Heart attack  Mother    Hyperlipidemia Mother    CAD Other    Prostate cancer Neg Hx    Cancer Neg Hx       Prior to Admission medications   Medication Sig Start Date End Date Taking? Authorizing Provider  acetaminophen (TYLENOL) 500 MG tablet Take 1,000 mg by mouth every 6 (six) hours as needed for moderate pain.   Yes [provider]  albuterol (VENTOLIN HFA) 108 (90 Base) MCG/ACT inhaler INHALE 2 PUFFS INTO LUNGS EVERY 6 HOURS AS NEEDED FOR WHEEZING OR SHORTNESS OF BREATH 07/23/21  Yes Tyler Pita, MD  aspirin EC 81 MG tablet Take 81 mg by mouth daily.   Yes [provider]  azithromycin (ZITHROMAX) 250 MG tablet Take 250 mg by mouth 3 (three) times a week. Mon. Wed. And Fri. 11/01/21  Yes [provider]  cetirizine (ZYRTEC) 10 MG tablet Take 10 mg by mouth daily.   Yes [provider]  Cholecalciferol (VITAMIN D3) 1000 UNITS CAPS Take 1,000 Units by mouth daily.   Yes [provider]  donepezil (ARICEPT) 5 MG tablet Take 1 tablet (5 mg total) by mouth daily. 10/23/21  Yes Jerrol Banana., MD  ELIQUIS 5 MG TABS tablet TAKE 1 TABLET TWICE A DAY  (SWITCHED FROM PLAVIX) Patient taking differently: Take 5 mg by mouth 2 (two) times daily. 08/28/21  Yes Algernon Huxley, MD  ezetimibe (ZETIA) 10 MG tablet TAKE 1 TABLET DAILY 09/24/21  Yes Gollan, Kathlene November, MD  Fluticasone-Umeclidin-Vilant (TRELEGY ELLIPTA) 100-62.5-25 MCG/INH AEPB Inhale 1 puff into the lungs daily. 02/14/21  Yes Tyler Pita, MD  furosemide (LASIX) 20 MG tablet TAKE 1 TABLET BY MOUTH ONCE DAILY 10/09/21  Yes Gollan, Kathlene November, MD  Iron-Vitamin C (VITRON-C) 65-125 MG TABS Take 1 tablet by mouth daily. 04/16/21  Yes Earlie Server, MD  isosorbide mononitrate (IMDUR) 30 MG 24 hr tablet TAKE 1 TABLET DAILY 02/12/21  Yes Gollan, Kathlene November, MD  megestrol (MEGACE) 40 MG tablet Take 1 tablet (40 mg total) by mouth 2 (two) times daily. 09/18/21  Yes Earlie Server, MD  metFORMIN (GLUCOPHAGE) 500 MG tablet  Take 1 tablet (500 mg total) by mouth 2 (two) times daily with a meal. 10/23/21  Yes Jerrol Banana., MD  metoprolol succinate (TOPROL-XL) 25  MG 24 hr tablet TAKE 1 TABLET AT BEDTIME 09/24/21  Yes Gollan, Kathlene November, MD  montelukast (SINGULAIR) 10 MG tablet Take 10 mg by mouth daily as needed (sneezing/allergies.).   Yes [provider]  Multiple Vitamin (MULTIVITAMIN WITH MINERALS) TABS tablet Take 1 tablet by mouth daily. One-A-Day Multivitamin   Yes [provider]  omeprazole (PRILOSEC) 40 MG capsule TAKE 1 CAPSULE TWICE DAILY AS NEEDED 02/12/21  Yes Chrismon, [name], PA-C  predniSONE (DELTASONE) 5 MG tablet Take 5 mg by mouth daily. 11/01/21  Yes [provider]  sacubitril-valsartan (ENTRESTO) 49-51 MG Take 1 tablet by mouth 2 (two) times daily. 07/25/21 11/22/21 Yes Gollan, Kathlene November, MD  sertraline (ZOLOFT) 50 MG tablet Take 1 tablet (50 mg total) by mouth daily. 10/23/21  Yes Jerrol Banana., MD  simvastatin (ZOCOR) 40 MG tablet TAKE 1 TABLET AT BEDTIME 03/23/21  Yes Gollan, Kathlene November, MD  SODIUM BICARBONATE PO Take 1 tablet by mouth 3 (three) times daily.   Yes [provider]  spironolactone (ALDACTONE) 25 MG tablet Take 1 tablet (25 mg total) by mouth daily. 10/02/21 09/27/22 Yes Gollan, Kathlene November, MD  omeprazole (PRILOSEC) 40 MG capsule Take 1 capsule by mouth 2 (two) times daily. Patient not taking: No sig reported    [provider]    Physical Exam: Vitals:   11/05/21 0530 11/05/21 0600 11/05/21 0630 11/05/21 0700  BP: 121/63 125/73 (!) 143/75 122/72  Pulse: 83 82 95 97  Resp: 18 18 19 20   Temp:      TempSrc:      SpO2: 98% 97% 99% 97%  Weight:      Height:         Vitals:   11/05/21 0530 11/05/21 0600 11/05/21 0630 11/05/21 0700  BP: 121/63 125/73 (!) 143/75 122/72  Pulse: 83 82 95 97  Resp: 18 18 19 20   Temp:      TempSrc:      SpO2: 98% 97% 99% 97%  Weight:      Height:          Constitutional: Alert and  oriented x 3 . Not in any apparent distress.  Appears hard of hearing HEENT:      Head: Normocephalic and atraumatic.         Eyes: PERLA, EOMI, Conjunctivae are normal. Sclera is non-icteric.       Mouth/Throat: Mucous membranes are moist.       Neck: Supple with no signs of meningismus. Cardiovascular: Regular rate and rhythm. No murmurs, gallops, or rubs. 2+ symmetrical distal pulses are present . No JVD. No LE edema Respiratory: Respiratory effort normal .bilateral air entry in both lung fields. No wheezes or rhonchi.  Gastrointestinal: Soft, non tender, and non distended with positive bowel sounds.  Genitourinary: No CVA tenderness. Musculoskeletal: Nontender with normal range of motion in all extremities. No cyanosis, or erythema of extremities. Neurologic:  Face is symmetric. Moving all extremities. No gross focal neurologic deficits . Skin: Skin is warm, dry.  No rash or ulcers Psychiatric: Mood and affect are normal    Labs on Admission: I have personally reviewed following labs and imaging studies  CBC: Recent Labs  Lab 11/05/21 0315  WBC 5.4  NEUTROABS 4.0  HGB 10.9*  HCT 33.5*  MCV 85.0  PLT 417   Basic Metabolic Panel: Recent Labs  Lab 11/05/21 0315  NA 134*  K 5.1  CL 108  CO2 16*  GLUCOSE 245*  BUN 32*  CREATININE 1.42*  CALCIUM 9.3   GFR: Estimated Creatinine Clearance: 36 mL/min (A) (by C-G formula based on SCr of 1.42 mg/dL (H)). Liver Function Tests: Recent Labs  Lab 11/05/21 0315  AST 23  ALT 16  ALKPHOS 52  BILITOT 0.9  PROT 6.8  ALBUMIN 3.2*   No results for input(s): LIPASE, AMYLASE in the last 168 hours. No results for input(s): AMMONIA in the last 168 hours. Coagulation Profile: Recent Labs  Lab 11/05/21 0710  INR 1.2   Cardiac Enzymes: No results for input(s): CKTOTAL, CKMB, CKMBINDEX, TROPONINI in the last 168 hours. BNP (last 3 results) No results for input(s): PROBNP in the last 8760 hours. HbA1C: No results for  input(s): HGBA1C in the last 72 hours. CBG: No results for input(s): GLUCAP in the last 168 hours. Lipid Profile: No results for input(s): CHOL, HDL, LDLCALC, TRIG, CHOLHDL, LDLDIRECT in the last 72 hours. Thyroid Function Tests: No results for input(s): TSH, T4TOTAL, FREET4, T3FREE, THYROIDAB in the last 72 hours. Anemia Panel: No results for input(s): VITAMINB12, FOLATE, FERRITIN, TIBC, IRON, RETICCTPCT in the last 72 hours. Urine analysis:    Component Value Date/Time   COLORURINE YELLOW (A) 08/30/2019 1718   APPEARANCEUR CLEAR (A) 08/30/2019 1718   APPEARANCEUR Clear 05/07/2016 1026   LABSPEC 1.029 08/30/2019 1718   LABSPEC 1.015 11/11/2014 0450   PHURINE 6.0 08/30/2019 1718   GLUCOSEU NEGATIVE 08/30/2019 1718   GLUCOSEU Negative 11/11/2014 0450   HGBUR NEGATIVE 08/30/2019 1718   BILIRUBINUR NEGATIVE 08/30/2019 1718   BILIRUBINUR Negative 05/07/2016 1026   BILIRUBINUR Negative 11/11/2014 0450   KETONESUR 20 (A) 08/30/2019 1718   PROTEINUR NEGATIVE 08/30/2019 1718   UROBILINOGEN 0.2 04/18/2016 1057   UROBILINOGEN 0.2 06/15/2010 0942   NITRITE NEGATIVE 08/30/2019 1718   LEUKOCYTESUR NEGATIVE 08/30/2019 1718   LEUKOCYTESUR Negative 11/11/2014 0450    Radiological Exams on Admission: DG Chest Portable 1 View  Result Date: 11/05/2021 CLINICAL DATA:  Dyspnea EXAM: PORTABLE CHEST 1 VIEW COMPARISON:  10/28/2021 FINDINGS: Lung volumes are small. Developing left basilar atelectasis or infiltrate. Stable subtle infiltrate within the right mid lung zone. No pneumothorax or pleural effusion. Cardiac size is within normal limits. Left subclavian dual lead pacemaker is unchanged. Pulmonary vascularity is normal. No acute bone abnormality. IMPRESSION: Pulmonary hypoinflation. Developing left basilar atelectasis or infiltrate. Stable right mid lung zone subtle pulmonary infiltrate. Electronically Signed   By: Fidela Salisbury M.D.   On: 11/05/2021 03:34     Assessment/Plan Principal  Problem:   NSTEMI (non-ST elevated myocardial infarction) ([place]) Active Problems:   CAD (coronary artery disease)   Complete atrioventricular block (HCC)   Malignant neoplasm of right lung (HCC)   Chronic systolic congestive heart failure (HCC)   CKD stage 3 due to type 2 diabetes mellitus [facility])      Patient is an 87 year old male recently discharged from the hospital and is being admitted for evaluation of non-ST elevation MI.   Acute non-ST elevation MI In a patient with known coronary artery disease Patient presents for evaluation of left-sided chest pain which woke him up from sleep and was relieved with nitroglycerin. He had significantly elevated troponin levels during his last hospitalization 11/02 - 11/06 but was managed medically.  Cardiac catheterization was not done at the time due to patient's frail nature as well as renal insufficiency, We will continue to cycle cardiac enzymes Continue heparin drip initiated in the ER Continue aspirin, nitrates, metoprolol and statins We will consult cardiology    Chronic  systolic heart failure Last known LVEF of 20 to 25% with global hypokinesis and LVH (11/22) Continue furosemide, spironolactone, Entresto, metoprolol Maintain low-sodium diet    Diabetes mellitus with complications of stage III chronic kidney disease Maintain consistent carbohydrate diet Glycemic control with sliding scale insulin     Dementia with depression Continue donepezil and sertraline     History of complete heart block Status post pacemaker insertion     History of non-small cell lung cancer Involving the right lower lobe and right hilar region Patient is status post radiation therapy which he completed about 5 months ago Follow-up with oncology as an outpatient  DVT prophylaxis: Heparin Code Status: DO NOT RESUSCITATE Family Communication: Greater than 50% of time was spent discussing patient's condition and plan of care with his wife  and son at the bedside.  All questions and concerns have been addressed.  They verbalized understanding and agree with the plan.  CODE STATUS was discussed and patient is a DO NOT RESUSCITATE.   Disposition Plan: Back to previous home environment Consults called: Cardiology Status:At the time of admission, it appears that the appropriate admission status for this patient is inpatient. This is judged to be reasonable and necessary to provide the required intensity of service to ensure the patient's safety given the presenting symptoms, physical exam findings, and initial radiographic and laboratory data in the context of their comorbid conditions. Patient requires inpatient status due to high intensity of service, high risk for further deterioration and high frequency of surveillance required.     Collier Bullock MD [facility]     11/05/2021, 9:23 AM

## 2021-11-05 NOTE — Telephone Encounter (Signed)
Verbal okay given.  

## 2021-11-05 NOTE — Consult Note (Addendum)
Cardiology Consultation:   Patient ID: Howard [name] MRN: [id number]; DOB: July 20, 1931  Admit date: 11/05/2021 Date of Consult: 11/05/2021  PCP:  Jerrol Banana., MD   [facility] Providers Cardiologist:  [name], MD  Electrophysiologist:  [name], MD       Patient Profile:   Howard [name] is a 87 y.o. male with a hx of nonobstructive CAD by [place] in 03/2021, HFrEF secondary to NICM, complete heart block status post PPM, lung cancer s/p radiation, chronic hypoxic respiratory failure on supplemental oxygen at 3 L at baseline, splenic infarct on chronic [place] with Eliquis, AAA s/p engovascular grafting, PAD s/p multiple interventions, DM2, HTN, HLD, Bell's palsy, prostate cancer, vertigo, and OSA on CPAP who is being seen 11/05/2021 for the evaluation of NSTEMI at the request of Dr. Francine Graven.  History of Present Illness:   [name] is followed by Dr. [name] for the above cardiac issues. Previously evaluated by [facility] for WPW and underwent radiofrequency catheter ablation with subsequent development of BBB with CHB, requiring PPM implantation in 2011. He is s/p generator changeout 06/2019 secondary to ERI. Followed by Dr. Caryl Comes  Echo in 07/2014 showed EF 35-40%. Diagnostic cath in 2015 showed abnormal stress test with nonobstructive disease. EF improved to 45-50% in 2015. MPI in 07/2018 without significant ischemia with  attenuation corrected images showing moderate size region of fixed defect mid to apical inferior wall and apical region, EF 33%.  Overall, MPI ruled moderate low risk, given low EF and size of fixed defect.  Echo 03/2021 showed reduced EF 20-25%, LV global HK, mild LVH, RVSF moderately reduced, mild MR, Aortic valve with tricuspid with mild to mod sclerosis without stenosis. R/L [facility] 2022 showed mild to mod 3V CAD with severe calcification, no obstructive lesions were noted. Medical therapy was recommended.   Patient completed radiation treatment for lung cancer  earlier this year.   Recently admitted 11/2-11/6 with chronic hypoxic respiratory failure initially requiring Bipap, SOB treated for acute CHF and NSTEMI. HS trop up to 12,000 and he was treated with IV heparin for 48 hours. Echo showed LVEF 20-25%, G2DD, mod dilated LA, mild to mod MR, borderline dilation aortic root. HE was seen in follow-up 10/30/21 and had persistent SOB. Decided to not pursue cardiac cath.   Patient presented to the ER at [facility] 11/05/21 for chest pain. Woke up with sudden onset chest pain. It was centralized and dull in nature. Reported it was 7/10. It was different than other pain that he has felt in the past. Denied associated SOB, nausea, vomiting. Pain improved with NTG and ASA.   In the ER labs showed potassium 5.1, Scr 1.42, BUN 32, albumin 3.2, AST 23, ALT 16, Hgb 10.9, WBC 5.4. HS trop 39>100. Respiratory panel negative, CXR with pulmonary hyperinflation, possible left sided infiltrate. EKG showed Asensed v paced rhythm, 91bpm. HE was started on IV heparin and admitted.   Past Medical History:  Diagnosis Date   Arthritis    Bell palsy    Bell's palsy 04/12/2015   Cancer [facility])    prostate and skin   Chronic combined systolic and diastolic CHF, NYHA class 1 ([place])    a. 07/2014 Echo: EF 35-40%, Gr 1 DD.   Complete heart block ([place])    a. 11/2010 s/p SJM 2210 [facility] DC PPM, ser# [id number].   Depression    Diabetes mellitus without complication [facility])    Fall 11-10-14   GERD (gastroesophageal reflux disease)    History  of prostate cancer    Hyperlipidemia    Hypertension    LBBB (left bundle branch block)    Left-sided Bell's palsy    Lung cancer ([place]) 2016   NICM (nonischemic cardiomyopathy) ([place])    a. 07/2014 Echo: EF 35-40%, mid-apicalanteroseptal DK, Gr 1 DD, mild-mod dil LA.   Non-obstructive CAD    a. 07/2014 Abnl MV;  b. 08/2014 Cath: LM nl, LAD 30p, RI 40p, LCX nl, OM1 40, RCA dominant 30p, 70d-->Med Rx.   Poor balance    Presence of permanent cardiac  pacemaker    Sleep apnea    a. cpap   Vertigo    WPW (Wolff-Parkinson-White syndrome)    a. S/P RFCA 1991.    Past Surgical History:  Procedure Laterality Date   ABDOMINAL AORTIC ENDOVASCULAR STENT GRAFT  08/25/2019   Procedure: ABDOMINAL AORTIC ENDOVASCULAR STENT GRAFT;  Surgeon: Algernon Huxley, MD;  Location: [facility] ORS;  Service: Vascular;;   ANGIOPLASTY Left 08/25/2019   Procedure: ANGIOPLASTY;  Surgeon: Algernon Huxley, MD;  Location: [facility] ORS;  Service: Vascular;  Laterality: Left;  left SFA and stent placement   APPLICATION OF WOUND VAC Left 06/07/2015   Procedure: APPLICATION OF WOUND VAC;  Surgeon: Robert Bellow, MD;  Location: [facility] ORS;  Service: General;  Laterality: Left;  left upper back   [place]  08/26/2014   Single vessel obstructive CAD   CARPAL TUNNEL RELEASE  04-04-15   [facility]   CATARACT EXTRACTION  07-31-11 and 09-18-11   Catheter ablation  1991   for WPW   cervical fusion     CHOLECYSTECTOMY  09-07-14   ENDARTERECTOMY FEMORAL Left 08/25/2019   Procedure: ENDARTERECTOMY FEMORAL;  Surgeon: Algernon Huxley, MD;  Location: [facility] ORS;  Service: Vascular;  Laterality: Left;  common and produndis    ENDOVASCULAR REPAIR/STENT GRAFT Right 08/25/2019   Procedure: ENDOVASCULAR REPAIR/STENT GRAFT;  Surgeon: Algernon Huxley, MD;  Location: [facility] ORS;  Service: Vascular;  Laterality: Right;  renal artery   HAND SURGERY     right 1993; left 2005   [place] / REPLACE / REMOVE PACEMAKER     INSERTION OF ILIAC STENT Bilateral 08/25/2019   Procedure: INSERTION OF ILIAC STENT;  Surgeon: Algernon Huxley, MD;  Location: [facility] ORS;  Service: Vascular;  Laterality: Bilateral;   JOINT REPLACEMENT Left 2013   knee   JOINT REPLACEMENT Right 2004   knee   KNEE SURGERY     left knee 1991 and 1992; right knee [place] N/A 08/26/2014   Procedure: LEFT HEART CATHETERIZATION WITH CORONARY ANGIOGRAM;  Surgeon:  [name], MD;  Location: [facility] CATH LAB;  Service: Cardiovascular;  Laterality: N/A;   LOWER EXTREMITY ANGIOGRAPHY Left 08/23/2019   Procedure: Lower Extremity Angiography;  Surgeon: Algernon Huxley, MD;  Location: [place] CV LAB;  Service: Cardiovascular;  Laterality: Left;   LUMBAR LAMINECTOMY/DECOMPRESSION MICRODISCECTOMY N/A 06/07/2014   Procedure: LUMBAR FOUR TO FIVE LUMBAR LAMINECTOMY/DECOMPRESSION MICRODISCECTOMY 1 LEVEL;  Surgeon: [name], MD;  Location: [place] NEURO ORS;  Service: Neurosurgery;  Laterality: N/A;   LUNG BIOPSY Right 2016   Dr [name]   MOHS SURGERY     PACEMAKER INSERTION     PPM-- St Jude 11/30/10 by [place] N/A 07/09/2019   Procedure: PPM GENERATOR CHANGEOUT;  Surgeon: Deboraha Sprang, MD;  Location: [place] CV LAB;  Service: Cardiovascular;  Laterality: N/A;   PROSTATE SURGERY     cancer--1998, prostatectomy   REPLACEMENT TOTAL KNEE     2004   RIGHT/LEFT HEART CATH AND CORONARY ANGIOGRAPHY Bilateral 04/13/2021   Procedure: RIGHT/LEFT HEART CATH AND CORONARY ANGIOGRAPHY;  Surgeon: Wellington Hampshire, MD;  Location: [place] CV LAB;  Service: Cardiovascular;  Laterality: Bilateral;   ruptured disc     1962 and 1998   TEE WITHOUT CARDIOVERSION N/A 09/01/2019   Procedure: TRANSESOPHAGEAL ECHOCARDIOGRAM (TEE);  Surgeon: [name], MD;  Location: [facility] ORS;  Service: Cardiovascular;  Laterality: N/A;   TEMPORARY PACEMAKER N/A 07/09/2019   Procedure: TEMPORARY PACEMAKER;  Surgeon: Deboraha Sprang, MD;  Location: [place] CV LAB;  Service: Cardiovascular;  Laterality: N/A;   TRIGGER FINGER RELEASE  01-24-15   WOUND DEBRIDEMENT Left 06/07/2015   Procedure: DEBRIDEMENT WOUND;  Surgeon: Robert Bellow, MD;  Location: [facility] ORS;  Service: General;  Laterality: Left;  left upper back     Home Medications:  Prior to Admission medications   Medication Sig Start Date End Date Taking? Authorizing Provider  acetaminophen (TYLENOL) 500 MG  tablet Take 1,000 mg by mouth every 6 (six) hours as needed for moderate pain.   Yes [provider]  albuterol (VENTOLIN HFA) 108 (90 Base) MCG/ACT inhaler INHALE 2 PUFFS INTO LUNGS EVERY 6 HOURS AS NEEDED FOR WHEEZING OR SHORTNESS OF BREATH 07/23/21  Yes Tyler Pita, MD  aspirin EC 81 MG tablet Take 81 mg by mouth daily.   Yes [provider]  azithromycin (ZITHROMAX) 250 MG tablet Take 250 mg by mouth 3 (three) times a week. Mon. Wed. And Fri. 11/01/21  Yes [provider]  cetirizine (ZYRTEC) 10 MG tablet Take 10 mg by mouth daily.   Yes [provider]  Cholecalciferol (VITAMIN D3) 1000 UNITS CAPS Take 1,000 Units by mouth daily.   Yes [provider]  donepezil (ARICEPT) 5 MG tablet Take 1 tablet (5 mg total) by mouth daily. 10/23/21  Yes Jerrol Banana., MD  ELIQUIS 5 MG TABS tablet TAKE 1 TABLET TWICE A DAY  (SWITCHED FROM PLAVIX) Patient taking differently: Take 5 mg by mouth 2 (two) times daily. 08/28/21  Yes Algernon Huxley, MD  ezetimibe (ZETIA) 10 MG tablet TAKE 1 TABLET DAILY 09/24/21  Yes Gollan, Kathlene November, MD  Fluticasone-Umeclidin-Vilant (TRELEGY ELLIPTA) 100-62.5-25 MCG/INH AEPB Inhale 1 puff into the lungs daily. 02/14/21  Yes Tyler Pita, MD  furosemide (LASIX) 20 MG tablet TAKE 1 TABLET BY MOUTH ONCE DAILY 10/09/21  Yes Gollan, Kathlene November, MD  Iron-Vitamin C (VITRON-C) 65-125 MG TABS Take 1 tablet by mouth daily. 04/16/21  Yes Earlie Server, MD  isosorbide mononitrate (IMDUR) 30 MG 24 hr tablet TAKE 1 TABLET DAILY 02/12/21  Yes Gollan, Kathlene November, MD  megestrol (MEGACE) 40 MG tablet Take 1 tablet (40 mg total) by mouth 2 (two) times daily. 09/18/21  Yes Earlie Server, MD  metFORMIN (GLUCOPHAGE) 500 MG tablet Take 1 tablet (500 mg total) by mouth 2 (two) times daily with a meal. 10/23/21  Yes Jerrol Banana., MD  metoprolol succinate (TOPROL-XL) 25 MG 24 hr tablet TAKE 1 TABLET AT BEDTIME 09/24/21  Yes Gollan, Kathlene November, MD   montelukast (SINGULAIR) 10 MG tablet Take 10 mg by mouth daily as needed (sneezing/allergies.).   Yes [provider]  Multiple Vitamin (MULTIVITAMIN WITH MINERALS) TABS tablet Take 1 tablet by  mouth daily. One-A-Day Multivitamin   Yes [provider]  omeprazole (PRILOSEC) 40 MG capsule TAKE 1 CAPSULE TWICE DAILY AS NEEDED 02/12/21  Yes Chrismon, [name], PA-C  predniSONE (DELTASONE) 5 MG tablet Take 5 mg by mouth daily. 11/01/21  Yes [provider]  sacubitril-valsartan (ENTRESTO) 49-51 MG Take 1 tablet by mouth 2 (two) times daily. 07/25/21 11/22/21 Yes Gollan, Kathlene November, MD  sertraline (ZOLOFT) 50 MG tablet Take 1 tablet (50 mg total) by mouth daily. 10/23/21  Yes Jerrol Banana., MD  simvastatin (ZOCOR) 40 MG tablet TAKE 1 TABLET AT BEDTIME 03/23/21  Yes Gollan, Kathlene November, MD  SODIUM BICARBONATE PO Take 1 tablet by mouth 3 (three) times daily.   Yes [provider]  spironolactone (ALDACTONE) 25 MG tablet Take 1 tablet (25 mg total) by mouth daily. 10/02/21 09/27/22 Yes Gollan, Kathlene November, MD  omeprazole (PRILOSEC) 40 MG capsule Take 1 capsule by mouth 2 (two) times daily. Patient not taking: No sig reported    [provider]    Inpatient Medications: Scheduled Meds:  Continuous Infusions:  heparin 1,150 Units/hr (11/05/21 0800)   PRN Meds: nitroGLYCERIN  Allergies:    Allergies  Allergen Reactions   Sulfa Antibiotics Rash    Social History:   Social History   Socioeconomic History   Marital status: Married    Spouse name: Not on file   Number of children: 2   Years of education: College   Highest education level: Some college, no degree  Occupational History   Occupation: Retired  Tobacco Use   Smoking status: Former    Packs/day: 1.00    Years: 56.00    Pack years: 56.00    Types: Cigarettes    Quit date: 2011    Years since quitting: 11.8   Smokeless tobacco: Never  Vaping Use   Vaping Use: Never used  Substance  and Sexual Activity   Alcohol use: No   Drug use: No   Sexual activity: Not on file  Other Topics Concern   Not on file  Social History Narrative   Drinks 2 cups of coffee a day    Social Determinants of Radio broadcast assistant Strain: Low Risk    Difficulty of Paying Living Expenses: Not hard at all  Food Insecurity: No Food Insecurity   Worried About Charity fundraiser in the Last Year: Never true   Arboriculturist in the Last Year: Never true  Transportation Needs: No Transportation Needs   Lack of Transportation (Medical): No   Lack of Transportation (Non-Medical): No  Physical Activity: Inactive   Days of Exercise per Week: 0 days   Minutes of Exercise per Session: 0 min  Stress: No Stress Concern Present   Feeling of Stress : Not at all  Social Connections: Moderately Isolated   Frequency of Communication with Friends and Family: Twice a week   Frequency of Social Gatherings with Friends and Family: Twice a week   Attends Religious Services: Never   Marine scientist or Organizations: No   Attends Music therapist: Never   Marital Status: Married  Human resources officer Violence: Not At Risk   Fear of Current or Ex-Partner: No   Emotionally Abused: No   Physically Abused: No   Sexually Abused: No    Family History:    Family History  Problem Relation Age of Onset   Heart attack Mother    Hyperlipidemia Mother  CAD Other    Prostate cancer Neg Hx    Cancer Neg Hx      ROS:  Please see the history of present illness.   All other ROS reviewed and negative.     Physical Exam/Data:   Vitals:   11/05/21 0530 11/05/21 0600 11/05/21 0630 11/05/21 0700  BP: 121/63 125/73 (!) 143/75 122/72  Pulse: 83 82 95 97  Resp: 18 18 19 20   Temp:      TempSrc:      SpO2: 98% 97% 99% 97%  Weight:      Height:       No intake or output data in the 24 hours ending 11/05/21 0902 Last 3 Weights 11/05/2021 10/30/2021 10/27/2021  Weight (lbs) 179 lb 14.4  oz 180 lb 182 lb 12.8 oz  Weight (kg) 81.602 kg 81.647 kg 82.918 kg     Body mass index is 26.57 kg/m.  General:  Well nourished, well developed, in no acute distress HEENT: normal Neck: no JVD Vascular: No carotid bruits; Distal pulses 2+ bilaterally Cardiac:  normal S1, S2; RRR; no murmur  Lungs:  clear to auscultation bilaterally, no wheezing, rhonchi or rales  Abd: soft, nontender, no hepatomegaly  Ext: no edema Musculoskeletal:  No deformities, BUE and BLE strength normal and equal Skin: warm and dry  Neuro:  CNs 2-12 intact, no focal abnormalities noted Psych:  Normal affect   EKG:  The EKG was personally reviewed and demonstrates:   Asensed V paced 91bpm Telemetry:  Telemetry was personally reviewed and demonstrates:  A sensed v paced rhythm.   Relevant CV Studies:    2D echo 10/25/2021: Pending __________   2D echo 07/24/2021:  1. Left ventricular ejection fraction, by estimation, is 20 to 25%. The  left ventricle has severely decreased function. The left ventricle  demonstrates global hypokinesis. There is moderate left ventricular  hypertrophy. Left ventricular diastolic  parameters are indeterminate.   2. Right ventricular systolic function is normal. The right ventricular  size is normal. Tricuspid regurgitation signal is inadequate for assessing  PA pressure.   3. Left atrial size was mildly dilated.   4. The mitral valve is degenerative. Mild mitral valve regurgitation.   5. The aortic valve is tricuspid. There is moderate calcification of the  aortic valve. There is moderate thickening of the aortic valve. Aortic  valve regurgitation is not visualized. Mild to moderate aortic valve  sclerosis/calcification is present,  without any evidence of aortic stenosis. __________   [facility] 04/13/2021: Prox RCA lesion is 30% stenosed. Dist RCA lesion is 50% stenosed. Prox LAD to Mid LAD lesion is 20% stenosed. Ost Cx to Prox Cx lesion is 30% stenosed. 1st Mrg lesion  is 40% stenosed. 2nd Diag lesion is 60% stenosed.   1.  Mild to moderate three-vessel coronary artery disease with severe calcifications.  No obstructive lesions. 2.  Left ventricular angiography was not performed.  EF was severely reduced by echo. 3.  Right heart catheterization showed minimally elevated filling pressures, mild pulmonary hypertension and normal cardiac output.   Recommendations: The patient has nonischemic cardiomyopathy. Continue aggressive medical therapy. Volume status appears good. __________   2D echo 04/05/2021: 1. Left ventricular ejection fraction, by estimation, is 20 to 25%. The  left ventricle has severely decreased function. The left ventricle  demonstrates global hypokinesis. There is mild asymmetric left ventricular  hypertrophy. Left ventricular diastolic   parameters are consistent with Grade II diastolic dysfunction  (pseudonormalization). The average left ventricular global  longitudinal  strain is -4.6 %. The global longitudinal strain is abnormal.   2. Right ventricular systolic function is moderately reduced. The right  ventricular size is normal.   3. The mitral valve is degenerative. Mild mitral valve regurgitation.   4. The aortic valve is tricuspid. Aortic valve regurgitation is not  visualized. Mild to moderate aortic valve sclerosis/calcification is  present, without any evidence of aortic stenosis.   5. The inferior vena cava is dilated in size with <50% respiratory  variability, suggesting right atrial pressure of 15 mmHg.  Laboratory Data:  High Sensitivity Troponin:   Recent Labs  Lab 10/24/21 1728 10/25/21 0902 10/25/21 1033 11/05/21 0315 11/05/21 0503  TROPONINIHS 12,859*  2,390* 10,829* 10,263* 39* 100*     Chemistry Recent Labs  Lab 11/05/21 0315  NA 134*  K 5.1  CL 108  CO2 16*  GLUCOSE 245*  BUN 32*  CREATININE 1.42*  CALCIUM 9.3  GFRNONAA 48*  ANIONGAP 10    Recent Labs  Lab 11/05/21 0315  PROT 6.8   ALBUMIN 3.2*  AST 23  ALT 16  ALKPHOS 52  BILITOT 0.9   Lipids No results for input(s): CHOL, TRIG, HDL, LABVLDL, LDLCALC, CHOLHDL in the last 168 hours.  Hematology Recent Labs  Lab 11/05/21 0315  WBC 5.4  RBC 3.94*  HGB 10.9*  HCT 33.5*  MCV 85.0  MCH 27.7  MCHC 32.5  RDW 16.6*  PLT 269   Thyroid No results for input(s): TSH, FREET4 in the last 168 hours.  BNPNo results for input(s): BNP, PROBNP in the last 168 hours.  DDimer No results for input(s): DDIMER in the last 168 hours.   Radiology/Studies:  DG Chest Portable 1 View  Result Date: 11/05/2021 CLINICAL DATA:  Dyspnea EXAM: PORTABLE CHEST 1 VIEW COMPARISON:  10/28/2021 FINDINGS: Lung volumes are small. Developing left basilar atelectasis or infiltrate. Stable subtle infiltrate within the right mid lung zone. No pneumothorax or pleural effusion. Cardiac size is within normal limits. Left subclavian dual lead pacemaker is unchanged. Pulmonary vascularity is normal. No acute bone abnormality. IMPRESSION: Pulmonary hypoinflation. Developing left basilar atelectasis or infiltrate. Stable right mid lung zone subtle pulmonary infiltrate. Electronically Signed   By: Fidela Salisbury M.D.   On: 11/05/2021 03:34     Assessment and Plan:   NSTEMI 3V CAD - presented with chest pain different than prior episodes, improved with NTG. HS trop 39>>100. CXR with hypoinflation possible infiltrate. Otherwise labs relatively stable. EKG shows Sensed v paced rhythm. Started on IV heparin.  - Echo earlier in the year showed reduced EF 20-25% Subsequent cath showed mild to mod 3V CAD with no obstructive lesions - Recent admission treated for NSTEMI with Hs trop up to 12000 treated medically. EF 20-25%. At follow-up, family discussion resulted with no plan for invasive cardiac cath - continue IV heparin x 48 hours - continue ASA, zetia, Toprol 25mg  daily, simvastatin - Again family would like to pursue medical management - On Imdur, titrate  up as able for antianginal benefit if pressures can tolerate.  CM, EF 20-25% NICM - Echo earlier thisyear showed reduced EF with subsequent cath showing nonobstructive CAD - Appears euvolemic on exam - Did not tolerate Farxiga on exam - Continue Entresto, BB and spiro - home lasix continued - follow kidney function  CHB s/p PPM - last check 10/14 showed normal functioning device - consider interrogation   Chronic respiratory failure Mild pulmonary HTN COPD H/o lung cancer - Patient needed 3L at  baseline. He is at baseline. - following with pulmonology as OP  HTN - BP good - continue current medications  H/o splenic infarct - Eliquis held for IV heparin   For questions or updates, please contact [place] Please consult [email or web address] for contact info under    Signed, Brice Kossman Ninfa Meeker, PA-C  11/05/2021 9:02 AM

## 2021-11-05 NOTE — ED Notes (Signed)
Pt alert

## 2021-11-05 NOTE — Progress Notes (Signed)
ANTICOAGULATION CONSULT NOTE - Initial Consult  Pharmacy Consult for Heparin  Indication: chest pain/ACS  Allergies  Allergen Reactions   Sulfa Antibiotics Rash    Patient Measurements: Height: 5\' 9"  (175.3 cm) Weight: 81.6 kg (179 lb 14.4 oz) IBW/kg (Calculated) : 70.7 Heparin Dosing Weight: 81.6 kg   Vital Signs: Temp: 98.2 F (36.8 C) (11/14 0302) Temp Source: Oral (11/14 0302) BP: 122/72 (11/14 0700) Pulse Rate: 97 (11/14 0700)  Labs: Recent Labs    11/05/21 0315 11/05/21 0503  HGB 10.9*  --   HCT 33.5*  --   PLT 269  --   CREATININE 1.42*  --   TROPONINIHS 39* 100*    Estimated Creatinine Clearance: 36 mL/min (A) (by C-G formula based on SCr of 1.42 mg/dL (H)).   Medical History: Past Medical History:  Diagnosis Date   Arthritis    Bell palsy    Bell's palsy 04/12/2015   Cancer [facility])    prostate and skin   Chronic combined systolic and diastolic CHF, NYHA class 1 ([place])    a. 07/2014 Echo: EF 35-40%, Gr 1 DD.   Complete heart block ([place])    a. 11/2010 s/p SJM 2210 [facility] DC PPM, ser# [id number].   Depression    Diabetes mellitus without complication ([place])    Fall 11-10-14   GERD (gastroesophageal reflux disease)    History of prostate cancer    Hyperlipidemia    Hypertension    LBBB (left bundle branch block)    Left-sided Bell's palsy    Lung cancer ([place]) 2016   NICM (nonischemic cardiomyopathy) ([place])    a. 07/2014 Echo: EF 35-40%, mid-apicalanteroseptal DK, Gr 1 DD, mild-mod dil LA.   Non-obstructive CAD    a. 07/2014 Abnl MV;  b. 08/2014 Cath: LM nl, LAD 30p, RI 40p, LCX nl, OM1 40, RCA dominant 30p, 70d-->Med Rx.   Poor balance    Presence of permanent cardiac pacemaker    Sleep apnea    a. cpap   Vertigo    WPW (Wolff-Parkinson-White syndrome)    a. S/P RFCA 1991.    Medications:  (Not in a hospital admission)   Assessment: Pharmacy consulted to dose heparin in this 87 year old male admitted with ACS/NSTEMI.  Pt was on Eliquis 5 mg PO BID  at home, uncertain of last dose.   CrCl = 36 ml/min  Goal of Therapy:  Heparin level 0.3-0.7 units/ml aPTT 66 - 102 seconds Monitor platelets by anticoagulation protocol: Yes   Plan:  Will order heparin 4000 units IV X 1 bolus and and start heparin drip at 1150 units/hr.   Will use aPTT to guide dosing until HL and aPTT correlate. Will draw aPTT 8 hrs after start of drip.  Will check HL on 11/15 with labs.   Vihaan Gloss D 11/05/2021,7:04 AM

## 2021-11-05 NOTE — Telephone Encounter (Signed)
Please advise 

## 2021-11-05 NOTE — ED Notes (Signed)
Insulin given.  Pt alert.  Heparin turned off  on pt, per pharmacy, will restart at [place]

## 2021-11-06 ENCOUNTER — Inpatient Hospital Stay: Payer: Medicare Other

## 2021-11-06 ENCOUNTER — Other Ambulatory Visit: Payer: Self-pay

## 2021-11-06 ENCOUNTER — Ambulatory Visit: Payer: Medicare Other | Admitting: Family

## 2021-11-06 DIAGNOSIS — I5022 Chronic systolic (congestive) heart failure: Secondary | ICD-10-CM | POA: Diagnosis not present

## 2021-11-06 DIAGNOSIS — I214 Non-ST elevation (NSTEMI) myocardial infarction: Secondary | ICD-10-CM | POA: Diagnosis not present

## 2021-11-06 LAB — CBC
HCT: 37.1 % — ABNORMAL LOW (ref 39.0–52.0)
Hemoglobin: 12.2 g/dL — ABNORMAL LOW (ref 13.0–17.0)
MCH: 27.6 pg (ref 26.0–34.0)
MCHC: 32.9 g/dL (ref 30.0–36.0)
MCV: 83.9 fL (ref 80.0–100.0)
Platelets: 277 10*3/uL (ref 150–400)
RBC: 4.42 MIL/uL (ref 4.22–5.81)
RDW: 16.6 % — ABNORMAL HIGH (ref 11.5–15.5)
WBC: 6.1 10*3/uL (ref 4.0–10.5)
nRBC: 0 % (ref 0.0–0.2)

## 2021-11-06 LAB — APTT
aPTT: 75 seconds — ABNORMAL HIGH (ref 24–36)
aPTT: 88 seconds — ABNORMAL HIGH (ref 24–36)

## 2021-11-06 LAB — BASIC METABOLIC PANEL
Anion gap: 8 (ref 5–15)
BUN: 26 mg/dL — ABNORMAL HIGH (ref 8–23)
CO2: 20 mmol/L — ABNORMAL LOW (ref 22–32)
Calcium: 9.3 mg/dL (ref 8.9–10.3)
Chloride: 110 mmol/L (ref 98–111)
Creatinine, Ser: 1 mg/dL (ref 0.61–1.24)
GFR, Estimated: >60 mL/min (ref >60)
Glucose, Bld: 153 mg/dL — ABNORMAL HIGH (ref 70–99)
Potassium: 4.2 mmol/L (ref 3.5–5.1)
Sodium: 138 mmol/L (ref 135–145)

## 2021-11-06 LAB — GLUCOSE, CAPILLARY
Glucose-Capillary: 135 mg/dL — ABNORMAL HIGH (ref 70–99)
Glucose-Capillary: 161 mg/dL — ABNORMAL HIGH (ref 70–99)

## 2021-11-06 LAB — HEPARIN LEVEL (UNFRACTIONATED): Heparin Unfractionated: 0.77 IU/mL — ABNORMAL HIGH (ref 0.30–0.70)

## 2021-11-06 LAB — CBG MONITORING, ED
Glucose-Capillary: 136 mg/dL — ABNORMAL HIGH (ref 70–99)
Glucose-Capillary: 178 mg/dL — ABNORMAL HIGH (ref 70–99)

## 2021-11-06 LAB — TROPONIN I (HIGH SENSITIVITY): Troponin I (High Sensitivity): 849 ng/L (ref <18)

## 2021-11-06 MED ORDER — ISOSORBIDE MONONITRATE ER 60 MG PO TB24
60.0000 mg | ORAL_TABLET | Freq: Every day | ORAL | Status: DC
  Administered 2021-11-07 – 2021-11-09 (×3): 60 mg via ORAL
  Filled 2021-11-06 (×3): qty 1

## 2021-11-06 MED ORDER — ISOSORBIDE MONONITRATE ER 30 MG PO TB24
15.0000 mg | ORAL_TABLET | Freq: Once | ORAL | Status: AC
  Administered 2021-11-06: 15 mg via ORAL
  Filled 2021-11-06 (×2): qty 1

## 2021-11-06 MED ORDER — ORAL CARE MOUTH RINSE
15.0000 mL | Freq: Two times a day (BID) | OROMUCOSAL | Status: DC
  Administered 2021-11-06 – 2021-11-09 (×6): 15 mL via OROMUCOSAL

## 2021-11-06 NOTE — ED Notes (Signed)
Pt IV was removed before coming to this nurse because it got yanked out. IV team consult in.

## 2021-11-06 NOTE — ED Notes (Signed)
Pt sleeping. 

## 2021-11-06 NOTE — ED Notes (Signed)
Pt moved to cpod.  Report off to Winfred Burn cpod nurse

## 2021-11-06 NOTE — Progress Notes (Signed)
ANTICOAGULATION CONSULT NOTE  Pharmacy Consult for Heparin  Indication: chest pain/ACS  Allergies  Allergen Reactions   Sulfa Antibiotics Rash    Patient Measurements: Height: 5\' 9"  (175.3 cm) Weight: 81.6 kg (179 lb 14.4 oz) IBW/kg (Calculated) : 70.7 Heparin Dosing Weight: 81.6 kg   Vital Signs: BP: 129/80 (11/15 0400) Pulse Rate: 84 (11/15 0400)  Labs: Recent Labs    11/05/21 0315 11/05/21 0503 11/05/21 0710 11/05/21 0710 11/05/21 1543 11/06/21 0413 11/06/21 1335  HGB 10.9*  --   --   --   --  12.2*  --   HCT 33.5*  --   --   --   --  37.1*  --   PLT 269  --   --   --   --  277  --   APTT  --   --  48*   < > 147* 75* 88*  LABPROT  --   --  15.6*  --   --   --   --   INR  --   --  1.2  --   --   --   --   HEPARINUNFRC  --   --  >1.10*  --   --  0.77*  --   CREATININE 1.42*  --   --   --   --  1.00  --   TROPONINIHS 39* 100*  --   --  1,077* 849*  --    < > = values in this interval not displayed.     Estimated Creatinine Clearance: 51.1 mL/min (by C-G formula based on SCr of 1 mg/dL).   Medical History: Past Medical History:  Diagnosis Date   Arthritis    Bell palsy    Bell's palsy 04/12/2015   Cancer [facility])    prostate and skin   Chronic combined systolic and diastolic CHF, NYHA class 1 ([place])    a. 07/2014 Echo: EF 35-40%, Gr 1 DD.   Complete heart block ([place])    a. 11/2010 s/p SJM 2210 [facility] DC PPM, ser# [id number].   Depression    Diabetes mellitus without complication ([place])    Fall 11-10-14   GERD (gastroesophageal reflux disease)    History of prostate cancer    Hyperlipidemia    Hypertension    LBBB (left bundle branch block)    Left-sided Bell's palsy    Lung cancer ([place]) 2016   NICM (nonischemic cardiomyopathy) ([place])    a. 07/2014 Echo: EF 35-40%, mid-apicalanteroseptal DK, Gr 1 DD, mild-mod dil LA.   Non-obstructive CAD    a. 07/2014 Abnl MV;  b. 08/2014 Cath: LM nl, LAD 30p, RI 40p, LCX nl, OM1 40, RCA dominant 30p, 70d-->Med Rx.   Poor  balance    Presence of permanent cardiac pacemaker    Sleep apnea    a. cpap   Vertigo    WPW (Wolff-Parkinson-White syndrome)    a. S/P RFCA 1991.    Medications:  (Not in a hospital admission)  Assessment: Pharmacy consulted to dose heparin in this 87 year old male admitted with ACS/NSTEMI.  Pt was on Eliquis 5 mg PO BID at home, uncertain of last dose.    11/14 1543 aPTT supratherapeutic - spoke with phlebotomist who drew level and confirmed lab drawn correctly 11/14 0413 aPTT therapeutic x1 11/15 1335 aPTT therapeutic x2  Goal of Therapy:  Heparin level 0.3-0.7 units/ml once correlating with aPTT aPTT 66 - 102 seconds Monitor platelets by anticoagulation protocol: Yes  11/15  [place].  Pt line pulled out, waiting on IV team to restart, total time heparin infusion interrupted unknown 11/15 0413 aPTT 75, therapeutic, HL 0.77 supra      Plan:  aPTT therapeutic x2. Will continue heparin rate at 900 units/hr.    Will use aPTT to guide dosing until HL and aPTT correlate. Recheck aPTT with AM labs Recheck HL with AM labs  Sherilyn Banker, PharmD Clinical Pharmacist 11/06/2021 2:02 PM

## 2021-11-06 NOTE — ED Notes (Signed)
Whitney RN aware of assigned bed

## 2021-11-06 NOTE — Progress Notes (Addendum)
Progress Note  Patient Name: Howard [name] Date of Encounter: 11/06/2021  [facility] Cardiologist: [name], MD   Subjective   Reports worsening loose stools, uncontrollable. Patient denies chest pain. He is on 3L O2. HS trop went up to 1,000.   Inpatient Medications    Scheduled Meds:  ferrous sulfate  325 mg Oral Q breakfast   And   vitamin C  250 mg Oral Q breakfast   aspirin EC  81 mg Oral Daily   azithromycin  250 mg Oral Once per day on Mon Wed Fri   cholecalciferol  1,000 Units Oral Daily   donepezil  5 mg Oral Daily   ezetimibe  10 mg Oral Daily   fluticasone furoate-vilanterol  1 puff Inhalation Daily   And   umeclidinium bromide  1 puff Inhalation Daily   furosemide  20 mg Oral Daily   insulin aspart  0-15 Units Subcutaneous TID WC   isosorbide mononitrate  30 mg Oral Daily   loratadine  10 mg Oral Daily   megestrol  40 mg Oral BID   metoprolol succinate  25 mg Oral QHS   multivitamin with minerals  1 tablet Oral Daily   pantoprazole  40 mg Oral QHS   sacubitril-valsartan  1 tablet Oral BID   sertraline  50 mg Oral QHS   simvastatin  40 mg Oral QHS   sodium bicarbonate  650 mg Oral TID   spironolactone  25 mg Oral Daily   Continuous Infusions:  heparin 900 Units/hr (11/06/21 0733)   PRN Meds: acetaminophen, albuterol, montelukast, nitroGLYCERIN, ondansetron (ZOFRAN) IV   Vital Signs    Vitals:   11/06/21 0315 11/06/21 0330 11/06/21 0345 11/06/21 0400  BP:    129/80  Pulse: 78 88 83 84  Resp: (!) 23 19 (!) 24 (!) 24  Temp:      TempSrc:      SpO2: 97% 99% 96% 96%  Weight:      Height:        Intake/Output Summary (Last 24 hours) at 11/06/2021 1001 Last data filed at 11/05/2021 1130 Gross per 24 hour  Intake --  Output 200 ml  Net -200 ml   Last 3 Weights 11/05/2021 10/30/2021 10/27/2021  Weight (lbs) 179 lb 14.4 oz 180 lb 182 lb 12.8 oz  Weight (kg) 81.602 kg 81.647 kg 82.918 kg      Telemetry    Asensed V paced HR 80s -  Personally Reviewed  ECG    NO new - Personally Reviewed  Physical Exam   GEN: No acute distress.   Neck: No JVD Cardiac: RRR, no murmurs, rubs, or gallops.  Respiratory: Clear to auscultation bilaterally. GI: Soft, nontender, non-distended  MS: No edema; No deformity. Neuro:  Nonfocal  Psych: Normal affect   Labs    High Sensitivity Troponin:   Recent Labs  Lab 10/25/21 1033 11/05/21 0315 11/05/21 0503 11/05/21 1543 11/06/21 0413  TROPONINIHS 10,263* 39* 100* 1,077* 849*     Chemistry Recent Labs  Lab 11/05/21 0315 11/06/21 0413  NA 134* 138  K 5.1 4.2  CL 108 110  CO2 16* 20*  GLUCOSE 245* 153*  BUN 32* 26*  CREATININE 1.42* 1.00  CALCIUM 9.3 9.3  PROT 6.8  --   ALBUMIN 3.2*  --   AST 23  --   ALT 16  --   ALKPHOS 52  --   BILITOT 0.9  --   GFRNONAA 48* >60  ANIONGAP 10 8  Lipids No results for input(s): CHOL, TRIG, HDL, LABVLDL, LDLCALC, CHOLHDL in the last 168 hours.  Hematology Recent Labs  Lab 11/05/21 0315 11/06/21 0413  WBC 5.4 6.1  RBC 3.94* 4.42  HGB 10.9* 12.2*  HCT 33.5* 37.1*  MCV 85.0 83.9  MCH 27.7 27.6  MCHC 32.5 32.9  RDW 16.6* 16.6*  PLT 269 277   Thyroid No results for input(s): TSH, FREET4 in the last 168 hours.  BNPNo results for input(s): BNP, PROBNP in the last 168 hours.  DDimer No results for input(s): DDIMER in the last 168 hours.   Radiology    DG Chest Portable 1 View  Result Date: 11/05/2021 CLINICAL DATA:  Dyspnea EXAM: PORTABLE CHEST 1 VIEW COMPARISON:  10/28/2021 FINDINGS: Lung volumes are small. Developing left basilar atelectasis or infiltrate. Stable subtle infiltrate within the right mid lung zone. No pneumothorax or pleural effusion. Cardiac size is within normal limits. Left subclavian dual lead pacemaker is unchanged. Pulmonary vascularity is normal. No acute bone abnormality. IMPRESSION: Pulmonary hypoinflation. Developing left basilar atelectasis or infiltrate. Stable right mid lung zone subtle  pulmonary infiltrate. Electronically Signed   By: Fidela Salisbury M.D.   On: 11/05/2021 03:34    Cardiac Studies   Echo 10/25/21 1. Left ventricular ejection fraction, by estimation, is 20 to 25%. The  left ventricle has severely decreased function. The left ventricle  demonstrates global hypokinesis. There is mild left ventricular  hypertrophy. Left ventricular diastolic parameters   are consistent with Grade II diastolic dysfunction (pseudonormalization).   2. Right ventricular systolic function is normal. The right ventricular  size is normal. Tricuspid regurgitation signal is inadequate for assessing  PA pressure.   3. Left atrial size was moderately dilated.   4. The mitral valve is normal in structure. Mild to moderate mitral valve  regurgitation. No evidence of mitral stenosis.   5. The aortic valve was not well visualized. Aortic valve regurgitation  is not visualized. Mild to moderate aortic valve sclerosis/calcification  is present, without any evidence of aortic stenosis.   6. There is borderline dilatation of the aortic root, measuring 37 mm.   7. The inferior vena cava is normal in size with greater than 50%  respiratory variability, suggesting right atrial pressure of 3 mmHg.   2D echo 07/24/2021:  1. Left ventricular ejection fraction, by estimation, is 20 to 25%. The  left ventricle has severely decreased function. The left ventricle  demonstrates global hypokinesis. There is moderate left ventricular  hypertrophy. Left ventricular diastolic  parameters are indeterminate.   2. Right ventricular systolic function is normal. The right ventricular  size is normal. Tricuspid regurgitation signal is inadequate for assessing  PA pressure.   3. Left atrial size was mildly dilated.   4. The mitral valve is degenerative. Mild mitral valve regurgitation.   5. The aortic valve is tricuspid. There is moderate calcification of the  aortic valve. There is moderate thickening of the  aortic valve. Aortic  valve regurgitation is not visualized. Mild to moderate aortic valve  sclerosis/calcification is present,  without any evidence of aortic stenosis. __________   [facility] 04/13/2021: Prox RCA lesion is 30% stenosed. Dist RCA lesion is 50% stenosed. Prox LAD to Mid LAD lesion is 20% stenosed. Ost Cx to Prox Cx lesion is 30% stenosed. 1st Mrg lesion is 40% stenosed. 2nd Diag lesion is 60% stenosed.   1.  Mild to moderate three-vessel coronary artery disease with severe calcifications.  No obstructive lesions. 2.  Left ventricular  angiography was not performed.  EF was severely reduced by echo. 3.  Right heart catheterization showed minimally elevated filling pressures, mild pulmonary hypertension and normal cardiac output.   Recommendations: The patient has nonischemic cardiomyopathy. Continue aggressive medical therapy. Volume status appears good. __________   2D echo 04/05/2021: 1. Left ventricular ejection fraction, by estimation, is 20 to 25%. The  left ventricle has severely decreased function. The left ventricle  demonstrates global hypokinesis. There is mild asymmetric left ventricular  hypertrophy. Left ventricular diastolic   parameters are consistent with Grade II diastolic dysfunction  (pseudonormalization). The average left ventricular global longitudinal  strain is -4.6 %. The global longitudinal strain is abnormal.   2. Right ventricular systolic function is moderately reduced. The right  ventricular size is normal.   3. The mitral valve is degenerative. Mild mitral valve regurgitation.   4. The aortic valve is tricuspid. Aortic valve regurgitation is not  visualized. Mild to moderate aortic valve sclerosis/calcification is  present, without any evidence of aortic stenosis.   5. The inferior vena cava is dilated in size with <50% respiratory  variability, suggesting right atrial pressure of 15 mmHg.    Patient Profile     87 y.o. male hx of  nonobstructive CAD by [address] in 03/2021, HFrEF secondary to NICM, complete heart block status post PPM, lung cancer s/p radiation, chronic hypoxic respiratory failure on supplemental oxygen at 3 L at baseline, splenic infarct on chronic OAC with Eliquis, AAA s/p engovascular grafting, PAD s/p multiple interventions, DM2, HTN, HLD, Bell's palsy, prostate cancer, vertigo, and OSA on CPAP who is being seen 11/05/2021 for the evaluation of NSTEMI   Assessment & Plan    NSTEMI 3V CAD - presented with chest pain different than prior episodes, improved with NTG. HS trop elevated and started on IV heparin. - HS trop 39>>100>1,000>700, now down trending  - Echo earlier in the year showed reduced EF 20-25% Subsequent cath showed mild to mod 3V CAD with no obstructive lesions - Recent admission treated for NSTEMI with Hs trop up to 12000 treated medically. EF 20-25%. At follow-up, family discussion resulted with no plan for invasive cardiac cath - Patient denies further chest pain - continue IV heparin x 48 hours - continue ASA, zetia, Toprol 25mg  daily, simvastatin - Again family would like to pursue medical management - On Imdur>>will increase to 60mg  daily   CM, EF 20-25% NICM - Echo earlier this year showed reduced EF with subsequent cath showing nonobstructive CAD - Appears euvolemic on exam - Did not tolerate [place] on exam - Continue Entresto, BB and spiro - home lasix continued - follow kidney function   CHB s/p PPM - last check 10/14 showed normal functioning device - consider interrogation    Chronic respiratory failure Mild pulmonary HTN COPD H/o lung cancer - Patient needed 3L at baseline. He is at baseline. - following with pulmonology as OP   HTN - BP good - continue current medications, increase Imdur as above   H/o splenic infarct - Eliquis held for IV heparin  Loose stools - reports loose stools for the past few weeks, worse in the last few days - further work-up per  IM  For questions or updates, please contact [place] Please consult [email or web address] for contact info under   Signed, Cadence Ninfa Meeker, PA-C  11/06/2021, 10:01 AM     I have independently seen and examined the patient and agree with the findings and plan, as documented in the PA/NP's note,  with the following additions/changes.  Mr. Marley reports vague transient chest pain but otherwise does not vocalize any complaints.  His wife is very concerning about frequent loose stools that have been present for ~6 weeks but seem to have worsened significantly over the last week.  Exam notable for RRR w/o m/r/g.  Breath sounds mildly diminished throughout w/o wheezes or crackles.  Abdomen soft NT/ND.  No LE edema or JVD.  Labs notable for sodium 138, potassium 4.2, BUN 26, creatinine 1.0, WBC 6.1, hemoglobin 12.2, and platelets 277.  HS-TnI peaked yesterday afternoon at 1,077.  EKG this morning shows sinus tachycardia with nonspecific T wave abnormality.  Telemetry shows ventricular pacing with occasional PVCs.  From a heart standpoint, Mr. [place] is stable.  I think it would be reasonable to escalate his isosorbide mononitrate to 60 mg daily to see if we can further minimize his recurrent chest pain.  I have reviewed his catheterization films from April with Dr. Fletcher Anon, which show mild-moderate multivessel disease.  We will continue with medical therapy unless he has refractory symptoms and would be willing to undergo potentially high risk procedures.  Further work-up of diarrhea will be deferred to the hospitalist team.  Nelva Bush, MD [place]

## 2021-11-06 NOTE — Telephone Encounter (Signed)
Ms. Howard [name] advised.   Thanks,   -Mickel Baas

## 2021-11-06 NOTE — Progress Notes (Signed)
PROGRESS NOTE  Howard [name]:[id number] DOB: 21-Oct-1931 DOA: 11/05/2021 PCP: Jerrol Banana., MD  HPI/Recap of past 24 hours: Howard [name] is a 87 y.o. male with medical history significant for HFrEF 20-25%, chronic combined diastolic and systolic CHF, type 2 diabetes, GERD, hypertension, hyperlipidemia, non-small cell lung cancer s/p radiation, chronic respiratory failure on 3 L nasal cannula continuously, emphysema, complete heart block status post pacemaker placement, CAD, recently discharged from the hospital on 10/28/21 for acute on chronic respiratory failure secondary to pneumonia as well as NSTEMI, who presents to [facility] ED from home with complaints of chest pain that woke him up out of his sleep early morning of 11/05/21.  His wife reports worsening generalized weakness since his discharge, using a walker to avoid falls, and stool incontinence with multiple accidents at home.    Work-up revealed atypical chest pain with elevated troponin for he was started on heparin drip and cardiology consulted.  Seen by cardiology, recommended conservative management.  11/06/2021: Patient was seen and examined at his bedside in the ED.  His wife was not present in the room.  He denies any chest pain at the time of this visit.  Denies any dyspnea or palpitations.  He states he is on 3 L nasal cannula continuously at baseline.  Patient was seen by cardiology, recommended to continue medical therapy and to restart Eliquis tomorrow as long as no plans for any other invasive procedures.  Assessment/Plan: Principal Problem:   NSTEMI (non-ST elevated myocardial infarction) ([place]) Active Problems:   CAD (coronary artery disease)   Complete atrioventricular block (HCC)   Malignant neoplasm of right lung (HCC)   Chronic systolic congestive heart failure (HCC)   CKD stage 3 due to type 2 diabetes mellitus ([place])  Atypical chest pain with elevated troponin Recently discharged from [facility] after  admission for NSTEMI on 10/28/2021 Presents with nonexertional chest pain that woke him out of his sleep. Troponin on admission 39, peaked at 1077. Nonspecific ST-T changes on twelve-lead EKG. Received a full dose aspirin on admission Was started on heparin drip on 11/05/2021. High-sensitivity troponin downtrending. Seen by cardiology, recommended restarting Eliquis on 11/07/2021 if no plans for invasive procedures. Last 2D echo done on 10/25/2021 showed LVEF 20 to 25% with left ventricle global hypokinesis, grade 2 diastolic dysfunction. Resume cardiac medications as recommended by cardiology. Continue to monitor on telemetry.  Loose stools with incontinence, unclear etiology Patient is on p.o. azithromycin 3 times a week, which could contribute. GI panel by PCR/C. difficile PCR ordered, follow results. Euvolemic on exam. Replete electrolytes as indicated.  Chronic combined diastolic and systolic CHF Euvolemic on exam. Last 2D echo done on 10/25/2021 showed LVEF 20 to 25% with left ventricle global hypokinesis, grade 2 diastolic dysfunction. Start strict I's and O's and daily weight Management per cardiology.  Type 2 diabetes with hyperglycemia Hemoglobin A1c 8.2 on 10/24/2021 Continue insulin sliding scale Avoid hypoglycemia  Chronic hypoxic respiratory failure secondary to emphysema/small cell lung cancer On 3 L oxygen by nasal cannula continuously at baseline. Maintain O2 saturation greater than 92%. Continue home regimen, bronchodilators. Out of bed to chair with every shift Mobilize as tolerated.  Moderate protein calorie malnutrition BMI 26 Continue Megace for appetite stimulant Encourage increase in oral protein calorie intake.  Non-small cell lung cancer status post radiation Continue home regimen/bronchodilators  Physical debility/generalized weakness PT OT assessment Mobilize as tolerated with assistance. Fall precautions.    Code Status: DNR  Family  Communication: None  at bedside.  Disposition Plan: Likely will discharge to home with home health services versus rehab once cardiology signs off.   Consultants: Cardiology  Procedures: None.  Antimicrobials: P.o. azithromycin 3 times a week, prior to admission.  DVT prophylaxis: Heparin drip.  Status is: Inpatient  Inpatient status.  Patient will require at least 2 midnights for further evaluation and treatment of present condition.      Objective: Vitals:   11/06/21 0315 11/06/21 0330 11/06/21 0345 11/06/21 0400  BP:    129/80  Pulse: 78 88 83 84  Resp: (!) 23 19 (!) 24 (!) 24  Temp:      TempSrc:      SpO2: 97% 99% 96% 96%  Weight:      Height:       No intake or output data in the 24 hours ending 11/06/21 1327 Filed Weights   11/05/21 0310  Weight: 81.6 kg    Exam:  General: 87 y.o. year-old male frail-appearing in no acute distress.  Alert and interactive. Cardiovascular: Regular rate and rhythm with no rubs or gallops.  No thyromegaly or JVD noted.   Respiratory: Mild rales at bases with no wheezing noted.  Poor inspiratory effort. Abdomen: Soft nontender nondistended with normal bowel sounds x4 quadrants. Musculoskeletal: Trace lower extremity edema bilaterally.   Skin: No ulcerative lesions noted or rashes, Psychiatry: Mood is appropriate for condition and setting   Data Reviewed: CBC: Recent Labs  Lab 11/05/21 0315 11/06/21 0413  WBC 5.4 6.1  NEUTROABS 4.0  --   HGB 10.9* 12.2*  HCT 33.5* 37.1*  MCV 85.0 83.9  PLT 269 811   Basic Metabolic Panel: Recent Labs  Lab 11/05/21 0315 11/06/21 0413  NA 134* 138  K 5.1 4.2  CL 108 110  CO2 16* 20*  GLUCOSE 245* 153*  BUN 32* 26*  CREATININE 1.42* 1.00  CALCIUM 9.3 9.3   GFR: Estimated Creatinine Clearance: 51.1 mL/min (by C-G formula based on SCr of 1 mg/dL). Liver Function Tests: Recent Labs  Lab 11/05/21 0315  AST 23  ALT 16  ALKPHOS 52  BILITOT 0.9  PROT 6.8  ALBUMIN 3.2*    No results for input(s): LIPASE, AMYLASE in the last 168 hours. No results for input(s): AMMONIA in the last 168 hours. Coagulation Profile: Recent Labs  Lab 11/05/21 0710  INR 1.2   Cardiac Enzymes: No results for input(s): CKTOTAL, CKMB, CKMBINDEX, TROPONINI in the last 168 hours. BNP (last 3 results) No results for input(s): PROBNP in the last 8760 hours. HbA1C: No results for input(s): HGBA1C in the last 72 hours. CBG: Recent Labs  Lab 11/05/21 1647 11/06/21 0854  GLUCAP 238* 136*   Lipid Profile: No results for input(s): CHOL, HDL, LDLCALC, TRIG, CHOLHDL, LDLDIRECT in the last 72 hours. Thyroid Function Tests: No results for input(s): TSH, T4TOTAL, FREET4, T3FREE, THYROIDAB in the last 72 hours. Anemia Panel: No results for input(s): VITAMINB12, FOLATE, FERRITIN, TIBC, IRON, RETICCTPCT in the last 72 hours. Urine analysis:    Component Value Date/Time   COLORURINE YELLOW (A) 08/30/2019 1718   APPEARANCEUR CLEAR (A) 08/30/2019 1718   APPEARANCEUR Clear 05/07/2016 1026   LABSPEC 1.029 08/30/2019 1718   LABSPEC 1.015 11/11/2014 0450   PHURINE 6.0 08/30/2019 1718   GLUCOSEU NEGATIVE 08/30/2019 1718   GLUCOSEU Negative 11/11/2014 0450   HGBUR NEGATIVE 08/30/2019 1718   BILIRUBINUR NEGATIVE 08/30/2019 1718   BILIRUBINUR Negative 05/07/2016 1026   BILIRUBINUR Negative 11/11/2014 0450   KETONESUR 20 (A) 08/30/2019  [place] 08/30/2019 1718   UROBILINOGEN 0.2 04/18/2016 1057   UROBILINOGEN 0.2 06/15/2010 0942   NITRITE NEGATIVE 08/30/2019 1718   LEUKOCYTESUR NEGATIVE 08/30/2019 1718   LEUKOCYTESUR Negative 11/11/2014 0450   Sepsis Labs: @LABRCNTIP (procalcitonin:4,lacticidven:4)  ) Recent Results (from the past 240 hour(s))  Resp Panel by RT-PCR (Flu A&B, Covid) Nasopharyngeal Swab     Status: None   Collection Time: 11/05/21  3:15 AM   Specimen: Nasopharyngeal Swab; Nasopharyngeal(NP) swabs in vial transport medium  Result Value Ref Range  Status   SARS Coronavirus 2 by RT PCR NEGATIVE NEGATIVE Final    Comment: (NOTE) SARS-CoV-2 target nucleic acids are NOT DETECTED.  The SARS-CoV-2 RNA is generally detectable in upper respiratory specimens during the acute phase of infection. The lowest concentration of SARS-CoV-2 viral copies this assay can detect is 138 copies/mL. A negative result does not preclude SARS-Cov-2 infection and should not be used as the sole basis for treatment or other patient management decisions. A negative result may occur with  improper specimen collection/handling, submission of specimen other than nasopharyngeal swab, presence of viral mutation(s) within the areas targeted by this assay, and inadequate number of viral copies(<138 copies/mL). A negative result must be combined with clinical observations, patient history, and epidemiological information. The expected result is Negative.  Fact Sheet for Patients:  EntrepreneurPulse.com.au  Fact Sheet for Healthcare Providers:  IncredibleEmployment.be  This test is no t yet approved or cleared by the [place] FDA and  has been authorized for detection and/or diagnosis of SARS-CoV-2 by FDA under an Emergency Use Authorization (EUA). This EUA will remain  in effect (meaning this test can be used) for the duration of the COVID-19 declaration under Section 564(b)(1) of the Act, 21 U.S.C.section 360bbb-3(b)(1), unless the authorization is terminated  or revoked sooner.       Influenza A by PCR NEGATIVE NEGATIVE Final   Influenza B by PCR NEGATIVE NEGATIVE Final    Comment: (NOTE) The Xpert Xpress SARS-CoV-2/FLU/RSV plus assay is intended as an aid in the diagnosis of influenza from Nasopharyngeal swab specimens and should not be used as a sole basis for treatment. Nasal washings and aspirates are unacceptable for Xpert Xpress SARS-CoV-2/FLU/RSV testing.  Fact Sheet for  Patients: EntrepreneurPulse.com.au  Fact Sheet for Healthcare Providers: IncredibleEmployment.be  This test is not yet approved or cleared by the [place] FDA and has been authorized for detection and/or diagnosis of SARS-CoV-2 by FDA under an Emergency Use Authorization (EUA). This EUA will remain in effect (meaning this test can be used) for the duration of the COVID-19 declaration under Section 564(b)(1) of the Act, 21 U.S.C. section 360bbb-3(b)(1), unless the authorization is terminated or revoked.  Performed at [facility], [address], [place], [place] [address]       Studies: No results found.  Scheduled Meds:  ferrous sulfate  325 mg Oral Q breakfast   And   vitamin C  250 mg Oral Q breakfast   aspirin EC  81 mg Oral Daily   azithromycin  250 mg Oral Once per day on Mon Wed Fri   cholecalciferol  1,000 Units Oral Daily   donepezil  5 mg Oral Daily   ezetimibe  10 mg Oral Daily   fluticasone furoate-vilanterol  1 puff Inhalation Daily   And   umeclidinium bromide  1 puff Inhalation Daily   furosemide  20 mg Oral Daily   insulin aspart  0-15 Units Subcutaneous TID WC   [START  ON 11/07/2021] isosorbide mononitrate  60 mg Oral Daily   loratadine  10 mg Oral Daily   megestrol  40 mg Oral BID   metoprolol succinate  25 mg Oral QHS   multivitamin with minerals  1 tablet Oral Daily   pantoprazole  40 mg Oral QHS   sacubitril-valsartan  1 tablet Oral BID   sertraline  50 mg Oral QHS   simvastatin  40 mg Oral QHS   sodium bicarbonate  650 mg Oral TID   spironolactone  25 mg Oral Daily    Continuous Infusions:  heparin 900 Units/hr (11/06/21 0733)     LOS: 1 day     Kayleen Memos, MD [facility] Pager [phone]  If 7PM-7AM, please contact night-coverage [email or web address] Password [email or web address] 11/06/2021, 1:27 PM

## 2021-11-06 NOTE — Progress Notes (Signed)
ANTICOAGULATION CONSULT NOTE  Pharmacy Consult for Heparin  Indication: chest pain/ACS  Allergies  Allergen Reactions   Sulfa Antibiotics Rash    Patient Measurements: Height: 5\' 9"  (175.3 cm) Weight: 81.6 kg (179 lb 14.4 oz) IBW/kg (Calculated) : 70.7 Heparin Dosing Weight: 81.6 kg   Vital Signs: BP: 129/80 (11/15 0400) Pulse Rate: 84 (11/15 0400)  Labs: Recent Labs    11/05/21 0315 11/05/21 0503 11/05/21 0710 11/05/21 1543 11/06/21 0413  HGB 10.9*  --   --   --  12.2*  HCT 33.5*  --   --   --  37.1*  PLT 269  --   --   --  277  APTT  --   --  48* 147* 75*  LABPROT  --   --  15.6*  --   --   INR  --   --  1.2  --   --   HEPARINUNFRC  --   --  >1.10*  --  0.77*  CREATININE 1.42*  --   --   --  1.00  TROPONINIHS 39* 100*  --  1,077* 849*     Estimated Creatinine Clearance: 51.1 mL/min (by C-G formula based on SCr of 1 mg/dL).   Medical History: Past Medical History:  Diagnosis Date   Arthritis    Bell palsy    Bell's palsy 04/12/2015   Cancer [facility])    prostate and skin   Chronic combined systolic and diastolic CHF, NYHA class 1 ([place])    a. 07/2014 Echo: EF 35-40%, Gr 1 DD.   Complete heart block ([place])    a. 11/2010 s/p SJM 2210 [facility] PPM, ser# [id number].   Depression    Diabetes mellitus without complication ([place])    Fall 11-10-14   GERD (gastroesophageal reflux disease)    History of prostate cancer    Hyperlipidemia    Hypertension    LBBB (left bundle branch block)    Left-sided Bell's palsy    Lung cancer ([place]) 2016   NICM (nonischemic cardiomyopathy) ([place])    a. 07/2014 Echo: EF 35-40%, mid-apicalanteroseptal DK, Gr 1 DD, mild-mod dil LA.   Non-obstructive CAD    a. 07/2014 Abnl MV;  b. 08/2014 Cath: LM nl, LAD 30p, RI 40p, LCX nl, OM1 40, RCA dominant 30p, 70d-->Med Rx.   Poor balance    Presence of permanent cardiac pacemaker    Sleep apnea    a. cpap   Vertigo    WPW (Wolff-Parkinson-White syndrome)    a. S/P RFCA 1991.     Medications:  (Not in a hospital admission)  Assessment: Pharmacy consulted to dose heparin in this 87 year old male admitted with ACS/NSTEMI.  Pt was on Eliquis 5 mg PO BID at home, uncertain of last dose.    11/14 1543 aPTT supratherapeutic - spoke with phlebotomist who drew level and confirmed lab drawn correctly  Goal of Therapy:  Heparin level 0.3-0.7 units/ml once correlating with aPTT aPTT 66 - 102 seconds Monitor platelets by anticoagulation protocol: Yes  11/15 0313 Contacted RN.  Pt line pulled out, waiting on IV team to restart, total time heparin infusion interrupted unknown 11/15 0413 aPTT 75, therapeutic, HL 0.77 supra      Plan:  aPTT therapeutic. Will continue heparin rate at 900 units/hr.    Will use aPTT to guide dosing until HL and aPTT correlate. Recheck aPTT in 8 hrs to confirm Recheck HL with AM albs  Renda Rolls, PharmD, [facility] 11/06/2021 5:24  AM

## 2021-11-06 NOTE — Progress Notes (Signed)
I was informed by bedside RN that the patient has not voided since 8 AM this morning.  Bladder scan showed 0 mL without evidence of urinary retention.  We will obtain a renal ultrasound to further assess.

## 2021-11-07 ENCOUNTER — Other Ambulatory Visit: Payer: Self-pay

## 2021-11-07 DIAGNOSIS — I214 Non-ST elevation (NSTEMI) myocardial infarction: Secondary | ICD-10-CM | POA: Diagnosis not present

## 2021-11-07 DIAGNOSIS — I5022 Chronic systolic (congestive) heart failure: Secondary | ICD-10-CM | POA: Diagnosis not present

## 2021-11-07 LAB — CBC
HCT: 32.2 % — ABNORMAL LOW (ref 39.0–52.0)
Hemoglobin: 10.7 g/dL — ABNORMAL LOW (ref 13.0–17.0)
MCH: 27.6 pg (ref 26.0–34.0)
MCHC: 33.2 g/dL (ref 30.0–36.0)
MCV: 83.2 fL (ref 80.0–100.0)
Platelets: 266 10*3/uL (ref 150–400)
RBC: 3.87 MIL/uL — ABNORMAL LOW (ref 4.22–5.81)
RDW: 16.6 % — ABNORMAL HIGH (ref 11.5–15.5)
WBC: 5.1 10*3/uL (ref 4.0–10.5)
nRBC: 0 % (ref 0.0–0.2)

## 2021-11-07 LAB — COMPREHENSIVE METABOLIC PANEL
ALT: 17 U/L (ref 0–44)
AST: 21 U/L (ref 15–41)
Albumin: 3.3 g/dL — ABNORMAL LOW (ref 3.5–5.0)
Alkaline Phosphatase: 45 U/L (ref 38–126)
Anion gap: 7 (ref 5–15)
BUN: 24 mg/dL — ABNORMAL HIGH (ref 8–23)
CO2: 21 mmol/L — ABNORMAL LOW (ref 22–32)
Calcium: 9.3 mg/dL (ref 8.9–10.3)
Chloride: 111 mmol/L (ref 98–111)
Creatinine, Ser: 1.1 mg/dL (ref 0.61–1.24)
GFR, Estimated: >60 mL/min (ref >60)
Glucose, Bld: 146 mg/dL — ABNORMAL HIGH (ref 70–99)
Potassium: 4.4 mmol/L (ref 3.5–5.1)
Sodium: 139 mmol/L (ref 135–145)
Total Bilirubin: 0.4 mg/dL (ref 0.3–1.2)
Total Protein: 6.5 g/dL (ref 6.5–8.1)

## 2021-11-07 LAB — GASTROINTESTINAL PANEL BY PCR, STOOL (REPLACES STOOL CULTURE)

## 2021-11-07 LAB — HEPARIN LEVEL (UNFRACTIONATED): Heparin Unfractionated: 0.36 IU/mL (ref 0.30–0.70)

## 2021-11-07 LAB — GLUCOSE, CAPILLARY
Glucose-Capillary: 141 mg/dL — ABNORMAL HIGH (ref 70–99)
Glucose-Capillary: 271 mg/dL — ABNORMAL HIGH (ref 70–99)
Glucose-Capillary: 170 mg/dL — ABNORMAL HIGH (ref 70–99)
Glucose-Capillary: 156 mg/dL — ABNORMAL HIGH (ref 70–99)

## 2021-11-07 LAB — PHOSPHORUS: Phosphorus: 3.5 mg/dL (ref 2.5–4.6)

## 2021-11-07 LAB — MAGNESIUM: Magnesium: 1.9 mg/dL (ref 1.7–2.4)

## 2021-11-07 LAB — C DIFFICILE QUICK SCREEN W PCR REFLEX
C Diff antigen: NEGATIVE
C Diff interpretation: NOT DETECTED
C Diff toxin: NEGATIVE

## 2021-11-07 LAB — APTT: aPTT: 76 seconds — ABNORMAL HIGH (ref 24–36)

## 2021-11-07 MED ORDER — SPIRONOLACTONE 25 MG PO TABS
12.5000 mg | ORAL_TABLET | Freq: Every day | ORAL | Status: DC
  Administered 2021-11-08 – 2021-11-09 (×2): 12.5 mg via ORAL
  Filled 2021-11-07 (×2): qty 0.5
  Filled 2021-11-07 (×2): qty 1

## 2021-11-07 MED ORDER — LOPERAMIDE HCL 2 MG PO CAPS
2.0000 mg | ORAL_CAPSULE | ORAL | Status: DC | PRN
  Administered 2021-11-07 – 2021-11-08 (×3): 2 mg via ORAL
  Filled 2021-11-07 (×3): qty 1

## 2021-11-07 MED ORDER — APIXABAN 5 MG PO TABS
5.0000 mg | ORAL_TABLET | Freq: Two times a day (BID) | ORAL | Status: DC
  Administered 2021-11-07 – 2021-11-09 (×4): 5 mg via ORAL
  Filled 2021-11-07 (×4): qty 1

## 2021-11-07 NOTE — TOC Initial Note (Signed)
Transition of Care [facility]) - Initial/Assessment Note    Patient Details  Name: Howard [name] MRN: [id number] Date of Birth: January 26, 1931  Transition of Care [facility]) CM/SW Contact:    Alberteen Sam, LCSW Phone Number: 11/07/2021, 2:03 PM  Clinical Narrative:                  CSW spoke with patient's spouse who confirms patient is active with [facility] for PT OT and RN. Reports no DME needs at home at this time has patient has a rollator. Would like to resume home health services at time of discharge, Corene Cornea with [facility] informed.   No other discharge needs identified at this time.    Expected Discharge Plan: [place] Barriers to Discharge: Continued Medical Work up   Patient Goals and CMS Choice Patient states their goals for this hospitalization and ongoing recovery are:: to go home CMS Medicare.gov Compare Post Acute Care list provided to:: Patient Represenative (must comment) (spouse) Choice offered to / list presented to : Spouse  Expected Discharge Plan and Services Expected Discharge Plan: [place] Acute Care Choice: [place] arrangements for the past 2 months: Single Family Home                           HH Arranged: PT, OT, RN [facility] Agency: [facility] ([place]) Date HH Agency Contacted: 11/07/21 Time HH Agency Contacted: 76 Representative spoke with at [place]: Corene Cornea  Prior Living Arrangements/Services Living arrangements for the past 2 months: [place] with:: Spouse Patient language and need for interpreter reviewed:: Yes Do you feel safe going back to the place where you live?: Yes      Need for Family Participation in Patient Care: Yes (Comment) Care giver support system in place?: Yes (comment)   Criminal Activity/Legal Involvement Pertinent to Current Situation/Hospitalization: No - Comment as needed  Activities of Daily Living Home Assistive  Devices/Equipment: Cane (specify quad or straight), Walker (specify type) ADL Screening (condition at time of admission) Patient's cognitive ability adequate to safely complete daily activities?: Yes Is the patient deaf or have difficulty hearing?: Yes Does the patient have difficulty seeing, even when wearing glasses/contacts?: No Does the patient have difficulty concentrating, remembering, or making decisions?: No Patient able to express need for assistance with ADLs?: Yes Does the patient have difficulty dressing or bathing?: No Independently performs ADLs?: No Communication: Needs assistance Dressing (OT): Independent Grooming: Independent Feeding: Independent Bathing: Independent Toileting: Needs assistance Is this a change from baseline?: Pre-admission baseline In/Out Bed: Independent with device (comment) Walks in Home: Independent with device (comment) Does the patient have difficulty walking or climbing stairs?: Yes Weakness of Legs: Both Weakness of Arms/Hands: None  Permission Sought/Granted Permission sought to share information with : Case Freight forwarder, Customer service manager, Family Supports Permission granted to share information with : Yes, Verbal Permission Granted     Permission granted to share info w AGENCY: HH        Emotional Assessment Appearance:: Appears stated age       Alcohol / Substance Use: Not Applicable Psych Involvement: No (comment)  Admission diagnosis:  Anuria [R34] NSTEMI (non-ST elevated myocardial infarction) [facility]) [I21.4] Patient Active Problem List   Diagnosis Date Noted   NSTEMI (non-ST elevated myocardial infarction) ([place]) 11/05/2021   CKD stage 3 due to type 2 diabetes mellitus ([place]) 11/05/2021  Abnormal CXR    Pulmonary infiltrate in right lung on CXR    Hypoxia    Acute on chronic HFrEF (heart failure with reduced ejection fraction) ([place]) 10/24/2021   Acute respiratory failure (HCC)    Normocytic anemia 09/18/2021    Other fatigue 38/88/2800   Chronic systolic congestive heart failure ([place]) 07/18/2021   Goals of care, counseling/discussion 04/16/2021   Mediastinal lymphadenopathy 04/16/2021   Heart failure with reduced ejection fraction ([place])    AAA (abdominal aortic aneurysm) without rupture 12/10/2019   Renal artery stenosis ([place]) 12/10/2019   PAD (peripheral artery disease) ([place]) 12/10/2019   Thromboembolism ([place]) 08/30/2019   Atherosclerosis of native arteries of extremity with intermittent claudication ([place]) 08/23/2019   Swelling of limb 08/17/2019   Pain in limb 08/17/2019   NICM (nonischemic cardiomyopathy) ([place]) 07/01/2019   CHF (congestive heart failure) ([place]) 07/01/2019   Malignant neoplasm of right lung ([place]) 04/18/2016   CAD (coronary artery disease) 04/01/2016   Adenocarcinoma ([place]) 04/01/2016   Skin cyst 12/26/2015   GERD (gastroesophageal reflux disease) 06/16/2015   Abscess of back 06/06/2015   Vertigo 03/27/2015   Carpal tunnel syndrome 10/28/2014   Status post cholecystectomy 09/29/2014   Disease of digestive tract 09/29/2014   Cardiomyopathy ([place]) 09/26/2014   Type 2 diabetes mellitus without complications ([place])    Sleep apnea    Spinal stenosis, lumbar region, with neurogenic claudication 06/07/2014   Lumbar stenosis with neurogenic claudication 06/07/2014   Abnormal gait 08/20/2012   H/O total knee replacement 08/20/2012   Arthritis of knee, degenerative 08/20/2012   Pacemaker-St.Jude 08/03/2012   Cardiac conduction disorder 06/19/2012   Acid reflux 06/18/2012   Nodal rhythm disorder 06/18/2012   Triggering of digit 03/25/2012   Hyperlipidemia 12/23/2011   Essential hypertension 03/23/2011   Atrioventricular block, complete ([place]) 03/23/2011   Complete atrioventricular block ([place]) 03/23/2011   PCP:  Jerrol Banana., MD Pharmacy:   [place], [place] - [address] [place] [address] Phone: [phone] Fax:  [phone]  [facility] [place], [place] - [address] [place] [address] Phone: [phone] Fax: [phone]  [facility] [place] [address] [place] [address] Phone: [phone] Fax: [place], [place] [address] [place] [address] Phone: [phone] Fax: [phone]     Social Determinants of Health ([place]) Interventions    Readmission Risk Interventions Readmission Risk Prevention Plan 10/26/2021 09/02/2019 08/28/2019  Transportation Screening Complete Complete Complete  PCP or Specialist Appt within 5-7 Days - - Complete  PCP or Specialist Appt within 3-5 Days Complete Complete -  Home Care Screening - - Complete  Medication Review (RN CM) - - Complete  HRI or Home Care Consult Complete Complete -  Social Work Consult for [place] Planning/Counseling Complete Complete -  Palliative Care Screening Not Applicable Not Applicable -  Medication Review (RN Care Manager) Complete Complete -  Some recent data might be hidden

## 2021-11-07 NOTE — Evaluation (Signed)
Occupational Therapy Evaluation Patient Details Name: Howard [name] MRN: [id number] DOB: 07-04-1931 Today's Date: 11/07/2021   History of Present Illness Howard [name] is an 56yoM who comes to [place] angina. Was here recently for PNA and NSTEMI. PMH: syncope, HOH, skin CA, heart block s/ ppm, depression, DM, PrCA, HLD, HTN, LungCA, NICM, imbalance and falls, OSA on CPAP, vertigo, CRF on 3L baseline. PT admitted for acute nSTEMI. Pt not appropriate for cardiac cath, started on heparin AC. PTA pt recently had finished cardiac rehab. Pt familiar to our services from recent acute care, outpatien tPT, and cardiac rehab.   Clinical Impression   Pt seen for OT evaluation this date. Upon arrival to room, pt reporting that he recently returned to bed after feeling lightheaded sitting in recliner. Vitals immediately obtained (BP 97/69, HR 82, SpO2 96% on 3L O2) and MD present (okay'd pt for light activity with compression stocking donned during OT evaluation). At baseline, pt is independent with BADLs, however wife expressed concern regarding pt's ability to safely bathe following recent hospital admission this month. Pt currently presents with decreased balance and strength, and requires MOD A for bed-level LB dressing, SUPERVISION for bed mobility, SUPERVISION for seated grooming tasks, and SUPERVISION for sit>stand transfers. Pt able to tolerate standing for 4 mins to obtain orthostatic vitals (see below), before reporting fatigue and requesting to have seated rest break. With compression stockings donned, pt denying dizziness throughout. Pt would benefit from additional skilled OT services to maximize return to PLOF and minimize risk of future falls, injury, caregiver burden, and readmission. Upon discharge, recommend [place] services.   Orthostatic VS for the past 24 hrs (Last 3 readings):  BP- Lying Pulse- Lying BP- Sitting Pulse- Sitting BP- Standing at 0 minutes Pulse- Standing at 0 minutes BP- Standing at  3 minutes  11/07/21 0900 97/69 82 104/60 86 98/72 117 137/64         Recommendations for follow up therapy are one component of a multi-disciplinary discharge planning process, led by the attending physician.  Recommendations may be updated based on patient status, additional functional criteria and insurance authorization.   Follow Up Recommendations  Home health OT    Assistance Recommended at Discharge Intermittent Supervision/Assistance  Functional Status Assessment  Patient has had a recent decline in their functional status and demonstrates the ability to make significant improvements in function in a reasonable and predictable amount of time.  Equipment Recommendations  Tub/shower seat       Precautions / Restrictions Precautions Precautions: Fall Restrictions Weight Bearing Restrictions: No      Mobility Bed Mobility Overal bed mobility: Needs Assistance Bed Mobility: Supine to Sit;Sit to Supine     Supine to sit: Supervision Sit to supine: Supervision   General bed mobility comments: SUPERVISION d/t pt reporting dizziness with change in position. No physical assistance required    Transfers Overall transfer level: Needs assistance Equipment used: Rolling walker (2 wheels) Transfers: Sit to/from Stand Sit to Stand: Supervision           General transfer comment: SUPERVISION d/t pt reporting dizziness with change in position. No physical assistance required      Balance Overall balance assessment: History of Falls;Needs assistance Sitting-balance support: Feet supported;No upper extremity supported Sitting balance-Leahy Scale: Good Sitting balance - Comments: good sitting balance at EOB reaching within BOS   Standing balance support: Bilateral upper extremity supported;During functional activity Standing balance-Leahy Scale: Fair Standing balance comment: SUPERVISION for static standing balance with b/l UE  support from RW                            ADL either performed or assessed with clinical judgement   ADL Overall ADL's : Needs assistance/impaired     Grooming: Brushing hair;Supervision/safety;Set up;Sitting               Lower Body Dressing: Moderate assistance;Bed level Lower Body Dressing Details (indicate cue type and reason): Pt able to doff socks independently, however requires physical assist to thread over toes/ankles when donning socks                     Vision Baseline Vision/History: 1 Wears glasses Ability to See in Adequate Light: 0 Adequate Patient Visual Report: No change from baseline              Pertinent Vitals/Pain Pain Assessment: No/denies pain        Extremity/Trunk Assessment Upper Extremity Assessment Upper Extremity Assessment: Overall WFL for tasks assessed (ROM WFL, MMT grossly 4-/5)   Lower Extremity Assessment Lower Extremity Assessment: Defer to PT evaluation       Communication Communication Communication: HOH   Cognition Arousal/Alertness: Awake/alert Behavior During Therapy: WFL for tasks assessed/performed Overall Cognitive Status: Within Functional Limits for tasks assessed                                                  Home Living Family/patient expects to be discharged to:: Private residence Living Arrangements: Spouse/significant other Available Help at Discharge: Family;Available 24 hours/day Type of Home: House Home Access: Stairs to enter [employer] of Steps: 3 Entrance Stairs-Rails: Right;Left;Can reach both Home Layout: Laundry or work area in basement;One level     Bathroom Shower/Tub: Occupational psychologist: Handicapped height     Home Equipment: Rollator (4 wheels)          Prior Functioning/Environment Prior Level of Function : Independent/Modified Independent;History of Falls (last six months)             Mobility Comments: has not driven in last 6 mo, walks with AD  intermittently, has been seeing OP PT to improve balance ([facility] [address]) ADLs Comments: Can perform all basic ADLs at baseline, some IADLs, but does endorse increased SOB with bending/lifting tasks.        OT Problem List: Decreased strength;Decreased activity tolerance;Decreased knowledge of use of DME or AE      OT Treatment/Interventions: Self-care/ADL training;Therapeutic exercise;Energy conservation;DME and/or AE instruction;Therapeutic activities;Patient/family education;Balance training    OT Goals(Current goals can be found in the care plan section) Acute Rehab OT Goals Patient Stated Goal: to shower safely OT Goal Formulation: With patient/family Time For Goal Achievement: 11/21/21 Potential to Achieve Goals: Good ADL Goals Pt Will Perform Upper Body Bathing: with set-up;with supervision;standing Pt Will Perform Lower Body Bathing: with set-up;with supervision;sit to/from stand Pt Will Perform Tub/Shower Transfer: with supervision;with set-up;tub bench  OT Frequency: Min 2X/week    AM-PAC OT "6 Clicks" Daily Activity     Outcome Measure Help from another person eating meals?: None Help from another person taking care of personal grooming?: A Little Help from another person toileting, which includes using toliet, bedpan, or urinal?: A Little Help from another person bathing (including washing, rinsing, drying)?: A Little  Help from another person to put on and taking off regular upper body clothing?: A Little Help from another person to put on and taking off regular lower body clothing?: A Little 6 Click Score: 19   End of Session Equipment Utilized During Treatment: Gait belt;Rolling walker (2 wheels);Oxygen Nurse Communication: Mobility status  Activity Tolerance: Patient tolerated treatment well Patient left: with call bell/phone within reach;with family/visitor present;in bed;with bed alarm set  OT Visit Diagnosis: Unsteadiness on feet (R26.81)                 Time: 1051 (MD in room during session)-1128 OT Time Calculation (min): 37 min Charges:  OT General Charges $OT Visit: 1 Visit OT Evaluation $OT Eval Moderate Complexity: 1 Mod OT Treatments $Self Care/Home Management : 8-22 mins  Fredirick Maudlin, OTR/L [place]

## 2021-11-07 NOTE — Progress Notes (Signed)
Progress Note  Patient Name: Howard [name] Date of Encounter: 11/07/2021  [facility] Cardiologist: [name], MD   Subjective   Patient denies further chest pain. Reported persistent loose stools.   Inpatient Medications    Scheduled Meds:  ferrous sulfate  325 mg Oral Q breakfast   And   vitamin C  250 mg Oral Q breakfast   aspirin EC  81 mg Oral Daily   azithromycin  250 mg Oral Once per day on Mon Wed Fri   cholecalciferol  1,000 Units Oral Daily   donepezil  5 mg Oral Daily   ezetimibe  10 mg Oral Daily   fluticasone furoate-vilanterol  1 puff Inhalation Daily   And   umeclidinium bromide  1 puff Inhalation Daily   furosemide  20 mg Oral Daily   insulin aspart  0-15 Units Subcutaneous TID WC   isosorbide mononitrate  60 mg Oral Daily   loratadine  10 mg Oral Daily   mouth rinse  15 mL Mouth Rinse BID   megestrol  40 mg Oral BID   metoprolol succinate  25 mg Oral QHS   multivitamin with minerals  1 tablet Oral Daily   pantoprazole  40 mg Oral QHS   sacubitril-valsartan  1 tablet Oral BID   sertraline  50 mg Oral QHS   simvastatin  40 mg Oral QHS   sodium bicarbonate  650 mg Oral TID   spironolactone  25 mg Oral Daily   Continuous Infusions:  heparin 900 Units/hr (11/07/21 0646)   PRN Meds: acetaminophen, albuterol, montelukast, nitroGLYCERIN, ondansetron (ZOFRAN) IV   Vital Signs    Vitals:   11/06/21 2348 11/07/21 0351 11/07/21 0352 11/07/21 0751  BP: 133/64 134/64  (!) 143/79  Pulse: 78 79  91  Resp: 18   18  Temp: 98.1 F (36.7 C) 98.1 F (36.7 C)  98.3 F (36.8 C)  TempSrc:  Oral    SpO2: 96% 95%  97%  Weight:   82.4 kg   Height:        Intake/Output Summary (Last 24 hours) at 11/07/2021 0854 Last data filed at 11/07/2021 0646 Gross per 24 hour  Intake 566.2 ml  Output 200 ml  Net 366.2 ml   Last 3 Weights 11/07/2021 11/06/2021 11/05/2021  Weight (lbs) 181 lb 10.5 oz 183 lb 11.2 oz 179 lb 14.4 oz  Weight (kg) 82.4 kg 83.326  kg 81.602 kg      Telemetry    Asensed V paced, PVCs, HR 70-80s - Personally Reviewed  ECG    No new - Personally Reviewed  Physical Exam   GEN: No acute distress.   Neck: No JVD Cardiac: RRR, no murmurs, rubs, or gallops.  Respiratory: Clear to auscultation bilaterally. GI: Soft, nontender, non-distended  MS: No edema; No deformity. Neuro:  Nonfocal  Psych: Normal affect   Labs    High Sensitivity Troponin:   Recent Labs  Lab 10/25/21 1033 11/05/21 0315 11/05/21 0503 11/05/21 1543 11/06/21 0413  TROPONINIHS 10,263* 39* 100* 1,077* 849*     Chemistry Recent Labs  Lab 11/05/21 0315 11/06/21 0413 11/07/21 0552  NA 134* 138 139  K 5.1 4.2 4.4  CL 108 110 111  CO2 16* 20* 21*  GLUCOSE 245* 153* 146*  BUN 32* 26* 24*  CREATININE 1.42* 1.00 1.10  CALCIUM 9.3 9.3 9.3  MG  --   --  1.9  PROT 6.8  --  6.5  ALBUMIN 3.2*  --  3.3*  AST 23  --  21  ALT 16  --  17  ALKPHOS 52  --  45  BILITOT 0.9  --  0.4  GFRNONAA 48* >60 >60  ANIONGAP 10 8 7     Lipids No results for input(s): CHOL, TRIG, HDL, LABVLDL, LDLCALC, CHOLHDL in the last 168 hours.  Hematology Recent Labs  Lab 11/05/21 0315 11/06/21 0413 11/07/21 0552  WBC 5.4 6.1 5.1  RBC 3.94* 4.42 3.87*  HGB 10.9* 12.2* 10.7*  HCT 33.5* 37.1* 32.2*  MCV 85.0 83.9 83.2  MCH 27.7 27.6 27.6  MCHC 32.5 32.9 33.2  RDW 16.6* 16.6* 16.6*  PLT 269 277 266   Thyroid No results for input(s): TSH, FREET4 in the last 168 hours.  BNPNo results for input(s): BNP, PROBNP in the last 168 hours.  DDimer No results for input(s): DDIMER in the last 168 hours.   Radiology    US RENAL  Result Date: 11/06/2021 CLINICAL DATA:  An area. EXAM: RENAL / URINARY TRACT ULTRASOUND COMPLETE COMPARISON:  February 23, 2014 FINDINGS: Right Kidney: Renal measurements: 11.7 cm x 4.1 cm x 4.2 cm = volume: 106 mL. Diffusely increased echogenicity of the renal parenchyma is noted. No mass or hydronephrosis visualized. Left Kidney: Renal  measurements: 12.1 cm x 6.1 cm x 5.5 cm = volume: 212 mL. Echogenicity within normal limits. A 2.7 cm x 1.5 cm x 2.2 cm well-defined, anechoic structure is seen within the lower pole of the left kidney. No abnormal flow is seen within this region on color Doppler evaluation. No hydronephrosis is visualized. Bladder: Appears normal for degree of bladder distention. Other: A mild amount of right-sided perirenal fluid is noted. IMPRESSION: 1. Increased echogenicity of the right kidney and perirenal fluid, which may be secondary to an infectious or inflammatory process such as acute pyelonephritis. Correlation with urinalysis is recommended. 2. Simple left renal cyst. No additional follow-up or imaging is recommended. Electronically Signed   By: Virgina Norfolk M.D.   On: 11/06/2021 16:41    Cardiac Studies     Echo 10/25/21 1. Left ventricular ejection fraction, by estimation, is 20 to 25%. The  left ventricle has severely decreased function. The left ventricle  demonstrates global hypokinesis. There is mild left ventricular  hypertrophy. Left ventricular diastolic parameters   are consistent with Grade II diastolic dysfunction (pseudonormalization).   2. Right ventricular systolic function is normal. The right ventricular  size is normal. Tricuspid regurgitation signal is inadequate for assessing  PA pressure.   3. Left atrial size was moderately dilated.   4. The mitral valve is normal in structure. Mild to moderate mitral valve  regurgitation. No evidence of mitral stenosis.   5. The aortic valve was not well visualized. Aortic valve regurgitation  is not visualized. Mild to moderate aortic valve sclerosis/calcification  is present, without any evidence of aortic stenosis.   6. There is borderline dilatation of the aortic root, measuring 37 mm.   7. The inferior vena cava is normal in size with greater than 50%  respiratory variability, suggesting right atrial pressure of 3 mmHg.    2D echo  07/24/2021:  1. Left ventricular ejection fraction, by estimation, is 20 to 25%. The  left ventricle has severely decreased function. The left ventricle  demonstrates global hypokinesis. There is moderate left ventricular  hypertrophy. Left ventricular diastolic  parameters are indeterminate.   2. Right ventricular systolic function is normal. The right ventricular  size is normal. Tricuspid regurgitation signal is inadequate for  assessing  PA pressure.   3. Left atrial size was mildly dilated.   4. The mitral valve is degenerative. Mild mitral valve regurgitation.   5. The aortic valve is tricuspid. There is moderate calcification of the  aortic valve. There is moderate thickening of the aortic valve. Aortic  valve regurgitation is not visualized. Mild to moderate aortic valve  sclerosis/calcification is present,  without any evidence of aortic stenosis. __________   [facility] 04/13/2021: Prox RCA lesion is 30% stenosed. Dist RCA lesion is 50% stenosed. Prox LAD to Mid LAD lesion is 20% stenosed. Ost Cx to Prox Cx lesion is 30% stenosed. 1st Mrg lesion is 40% stenosed. 2nd Diag lesion is 60% stenosed.   1.  Mild to moderate three-vessel coronary artery disease with severe calcifications.  No obstructive lesions. 2.  Left ventricular angiography was not performed.  EF was severely reduced by echo. 3.  Right heart catheterization showed minimally elevated filling pressures, mild pulmonary hypertension and normal cardiac output.   Recommendations: The patient has nonischemic cardiomyopathy. Continue aggressive medical therapy. Volume status appears good. __________   2D echo 04/05/2021: 1. Left ventricular ejection fraction, by estimation, is 20 to 25%. The  left ventricle has severely decreased function. The left ventricle  demonstrates global hypokinesis. There is mild asymmetric left ventricular  hypertrophy. Left ventricular diastolic   parameters are consistent with Grade II  diastolic dysfunction  (pseudonormalization). The average left ventricular global longitudinal  strain is -4.6 %. The global longitudinal strain is abnormal.   2. Right ventricular systolic function is moderately reduced. The right  ventricular size is normal.   3. The mitral valve is degenerative. Mild mitral valve regurgitation.   4. The aortic valve is tricuspid. Aortic valve regurgitation is not  visualized. Mild to moderate aortic valve sclerosis/calcification is  present, without any evidence of aortic stenosis.   5. The inferior vena cava is dilated in size with <50% respiratory  variability, suggesting right atrial pressure of 15 mmHg.  Patient Profile     87 y.o. male nonobstructive CAD by [place] in 03/2021, HFrEF secondary to NICM, complete heart block status post PPM, lung cancer s/p radiation, chronic hypoxic respiratory failure on supplemental oxygen at 3 L at baseline, splenic infarct on chronic OAC with Eliquis, AAA s/p engovascular grafting, PAD s/p multiple interventions, DM2, HTN, HLD, Bell's palsy, prostate cancer, vertigo, and OSA on CPAP who is being seen 11/05/2021 for the evaluation of NSTEMI   Assessment & Plan    NSTEMI 3V CAD - presented with chest pain different than prior episodes, improved with NTG. HS trop elevated and started on IV heparin. - HS trop 39>>100>1,000>700, now down trending  - Echo earlier in the year showed reduced EF 20-25% Subsequent cath showed mild to mod 3V CAD with no obstructive lesions - Prior recent admission treated for NSTEMI with Hs trop up to 12000 treated medically. EF 20-25%. At follow-up, family discussion resulted with no plan for invasive cardiac cath - Patient denies further chest pain - continue IV heparin x 48 hours, can stop today at 1800 - continue ASA, zetia, Toprol 25mg  daily, simvastatin - Again family would like to pursue medical management - Imdur increased to 60mg  daily - If patient has refractory symptoms plan for  heart cath   CM, EF 20-25% NICM - Echo earlier this year showed reduced EF with subsequent cath showing nonobstructive CAD - Appears euvolemic on exam - Did not tolerate [place] on exam - Continue Entresto, BB and spiro -  home lasix continued - follow kidney function   CHB s/p PPM - last check 10/14 showed normal functioning device   Chronic respiratory failure Mild pulmonary HTN COPD H/o lung cancer - Patient needed 3L at baseline. He is at baseline. - following with pulmonology as OP   HTN - BP good - continue current medications, increase Imdur as above   H/o splenic infarct - Eliquis held for IV heparin   Loose stools - reports loose stools for the past few weeks, worse in the last few days - further work-up per IM  For questions or updates, please contact [place] Please consult [email or web address] for contact info under        Signed, Emil Klassen Ninfa Meeker, PA-C  11/07/2021, 8:54 AM

## 2021-11-07 NOTE — Progress Notes (Signed)
ANTICOAGULATION CONSULT NOTE  Pharmacy Consult for Heparin  Indication: chest pain/ACS  Allergies  Allergen Reactions   Sulfa Antibiotics Rash    Patient Measurements: Height: 5\' 9"  (175.3 cm) Weight: 82.4 kg (181 lb 10.5 oz) IBW/kg (Calculated) : 70.7 Heparin Dosing Weight: 81.6 kg   Vital Signs: Temp: 98.1 F (36.7 C) (11/16 0351) Temp Source: Oral (11/16 0351) BP: 134/64 (11/16 0351) Pulse Rate: 79 (11/16 0351)  Labs: Recent Labs    11/05/21 0315 11/05/21 0503 11/05/21 0710 11/05/21 0710 11/05/21 1543 11/06/21 0413 11/06/21 1335 11/07/21 0552  HGB 10.9*  --   --   --   --  12.2*  --  10.7*  HCT 33.5*  --   --   --   --  37.1*  --  32.2*  PLT 269  --   --   --   --  277  --  266  APTT  --   --  48*   < > 147* 75* 88* 76*  LABPROT  --   --  15.6*  --   --   --   --   --   INR  --   --  1.2  --   --   --   --   --   HEPARINUNFRC  --   --  >1.10*  --   --  0.77*  --  0.36  CREATININE 1.42*  --   --   --   --  1.00  --  1.10  TROPONINIHS 39* 100*  --   --  1,077* 849*  --   --    < > = values in this interval not displayed.     Estimated Creatinine Clearance: 46.4 mL/min (by C-G formula based on SCr of 1.1 mg/dL).   Medical History: Past Medical History:  Diagnosis Date   Arthritis    Bell palsy    Bell's palsy 04/12/2015   Cancer [facility])    prostate and skin   Chronic combined systolic and diastolic CHF, NYHA class 1 ([place])    a. 07/2014 Echo: EF 35-40%, Gr 1 DD.   Complete heart block ([place])    a. 11/2010 s/p SJM [facility] PPM, ser# [id number].   Depression    Diabetes mellitus without complication ([place])    Fall 11-10-14   GERD (gastroesophageal reflux disease)    History of prostate cancer    Hyperlipidemia    Hypertension    LBBB (left bundle branch block)    Left-sided Bell's palsy    Lung cancer ([place]) 2016   NICM (nonischemic cardiomyopathy) ([place])    a. 07/2014 Echo: EF 35-40%, mid-apicalanteroseptal DK, Gr 1 DD, mild-mod dil LA.    Non-obstructive CAD    a. 07/2014 Abnl MV;  b. 08/2014 Cath: LM nl, LAD 30p, RI 40p, LCX nl, OM1 40, RCA dominant 30p, 70d-->Med Rx.   Poor balance    Presence of permanent cardiac pacemaker    Sleep apnea    a. cpap   Vertigo    WPW (Wolff-Parkinson-White syndrome)    a. S/P RFCA 1991.    Medications:  Medications Prior to Admission  Medication Sig Dispense Refill Last Dose   acetaminophen (TYLENOL) 500 MG tablet Take 1,000 mg by mouth every 6 (six) hours as needed for moderate pain.   11/04/2021 at 2100   albuterol (VENTOLIN HFA) 108 (90 Base) MCG/ACT inhaler INHALE 2 PUFFS INTO LUNGS EVERY 6 HOURS AS NEEDED FOR WHEEZING OR SHORTNESS OF  BREATH 8.5 g 6 prn at prn   aspirin EC 81 MG tablet Take 81 mg by mouth daily.   11/04/2021 at 0800   azithromycin (ZITHROMAX) 250 MG tablet Take 250 mg by mouth 3 (three) times a week. Mon. Wed. And Fri.   11/02/2021 at 0800   cetirizine (ZYRTEC) 10 MG tablet Take 10 mg by mouth daily.   11/04/2021 at 0800   Cholecalciferol (VITAMIN D3) 1000 UNITS CAPS Take 1,000 Units by mouth daily.   11/04/2021 at 0800   donepezil (ARICEPT) 5 MG tablet Take 1 tablet (5 mg total) by mouth daily. 30 tablet 12 11/04/2021 at 0800   ELIQUIS 5 MG TABS tablet TAKE 1 TABLET TWICE A DAY  (SWITCHED FROM PLAVIX) (Patient taking differently: Take 5 mg by mouth 2 (two) times daily.) 60 tablet 11 11/04/2021 at 2100   ezetimibe (ZETIA) 10 MG tablet TAKE 1 TABLET DAILY 90 tablet 0 11/04/2021 at 0800   Fluticasone-Umeclidin-Vilant (TRELEGY ELLIPTA) 100-62.5-25 MCG/INH AEPB Inhale 1 puff into the lungs daily. 180 each 3 11/04/2021 at 2100   furosemide (LASIX) 20 MG tablet TAKE 1 TABLET BY MOUTH ONCE DAILY 30 tablet 5 11/04/2021 at 0800   Iron-Vitamin C (VITRON-C) 65-125 MG TABS Take 1 tablet by mouth daily. 30 tablet 2 11/04/2021 at 0800   isosorbide mononitrate (IMDUR) 30 MG 24 hr tablet TAKE 1 TABLET DAILY 90 tablet 2 11/04/2021 at 0800   megestrol (MEGACE) 40 MG tablet Take 1 tablet  (40 mg total) by mouth 2 (two) times daily. 60 tablet 2 11/04/2021 at 2100   metFORMIN (GLUCOPHAGE) 500 MG tablet Take 1 tablet (500 mg total) by mouth 2 (two) times daily with a meal. 60 tablet 5 11/04/2021 at 2100   metoprolol succinate (TOPROL-XL) 25 MG 24 hr tablet TAKE 1 TABLET AT BEDTIME 90 tablet 0 11/04/2021 at 2100   montelukast (SINGULAIR) 10 MG tablet Take 10 mg by mouth daily as needed (sneezing/allergies.).   11/04/2021 at 2100   Multiple Vitamin (MULTIVITAMIN WITH MINERALS) TABS tablet Take 1 tablet by mouth daily. One-A-Day Multivitamin   11/04/2021 at 0800   omeprazole (PRILOSEC) 40 MG capsule TAKE 1 CAPSULE TWICE DAILY AS NEEDED 180 capsule 3 11/04/2021 at 2100   predniSONE (DELTASONE) 5 MG tablet Take 5 mg by mouth daily.   11/04/2021 at 0800   sacubitril-valsartan (ENTRESTO) 49-51 MG Take 1 tablet by mouth 2 (two) times daily. 180 tablet 3 11/04/2021 at 2100   sertraline (ZOLOFT) 50 MG tablet Take 1 tablet (50 mg total) by mouth daily. 30 tablet 3 11/04/2021 at 2100   simvastatin (ZOCOR) 40 MG tablet TAKE 1 TABLET AT BEDTIME 90 tablet 3 11/04/2021 at 2100   SODIUM BICARBONATE PO Take 1 tablet by mouth 3 (three) times daily.   11/04/2021 at 2100   spironolactone (ALDACTONE) 25 MG tablet Take 1 tablet (25 mg total) by mouth daily. 90 tablet 3 11/04/2021 at 0800   omeprazole (PRILOSEC) 40 MG capsule Take 1 capsule by mouth 2 (two) times daily. (Patient not taking: No sig reported)   Not Taking    Assessment: Pharmacy consulted to dose heparin in this 87 year old male admitted with ACS/NSTEMI.  Pt was on Eliquis 5 mg PO BID at home, uncertain of last dose.    11/14 1543 aPTT supratherapeutic - spoke with phlebotomist who drew level and confirmed lab drawn correctly 11/14 0413 aPTT therapeutic x1 11/15 1335 aPTT therapeutic x2  Goal of Therapy:  Heparin level 0.3-0.7 units/ml once correlating  with aPTT aPTT 66 - 102 seconds Monitor platelets by anticoagulation protocol:  Yes  11/15 0313 Contacted RN.  Pt line pulled out, waiting on IV team to restart, total time heparin infusion interrupted unknown 11/15 0413 aPTT 75, therapeutic, HL 0.77 supra  11/16 0552 aPTT 76, therapeutic, HL 0.36 thera     Plan:  aPTT therapeutic x3.  Will continue heparin rate at 900 units/hr.    Will use aPTT to guide dosing until HL and aPTT correlate. Recheck aPTT and HL with AM labs, then will transition to HL if correlation confirmed. CBC daily while on heparin.  Renda Rolls, PharmD, [facility] 11/07/2021 7:07 AM

## 2021-11-07 NOTE — Progress Notes (Signed)
PROGRESS NOTE  CEDAR DITULLIO JGG:[id number] DOB: 1931/05/10 DOA: 11/05/2021 PCP: Jerrol Banana., MD  HPI:  Howard [name] is a 87 y.o. male with medical history significant for HFrEF 20-25%, chronic combined diastolic and systolic CHF, type 2 diabetes, GERD, hypertension, hyperlipidemia, non-small cell lung cancer s/p radiation, chronic respiratory failure on 3 L nasal cannula continuously, emphysema, complete heart block status post pacemaker placement, CAD, recently discharged from the hospital on 10/28/21 for acute on chronic respiratory failure secondary to pneumonia as well as NSTEMI, who presents to [facility] ED from home with complaints of chest pain that woke him up out of his sleep early morning of 11/05/21.  His wife reports worsening generalized weakness since his discharge, using a walker to avoid falls, and stool incontinence with multiple accidents at home.    Work-up revealed  chest pain with elevated troponin for he was started on heparin drip and cardiology consulted.  Seen by cardiology, recommended conservative management.   Assessment/Plan: Principal Problem:   NSTEMI (non-ST elevated myocardial infarction) ([place]) Active Problems:   CAD (coronary artery disease)   Complete atrioventricular block (HCC)   Malignant neoplasm of right lung (HCC)   Chronic systolic congestive heart failure ([place])   CKD stage 3 due to type 2 diabetes mellitus ([place])  NSTEMI - Recently discharged from [facility] after admission for NSTEMI on 10/28/2021 - troponin peaked at 1077. -Cardiology input greatly appreciated, current recommendation for conservative management, and medication optimization, Imdur was increased to 60 mg daily, it did give significant relief with chest pain, continue with Toprol, aspirin and statin. -Condition to continue with heparin GTT till this evening, then transition back to Eliquis. - Last 2D echo done on 10/25/2021 showed LVEF 20 to 25% with left ventricle global  hypokinesis, grade 2 diastolic dysfunction. - Continue to monitor on telemetry.  Loose stools with incontinence, unclear etiology Patient is on p.o. azithromycin 3 times a week, which could contribute. No evidence of infectious etiology, will order Imodium as needed  Chronic combined diastolic and systolic CHF Euvolemic on exam. Last 2D echo done on 10/25/2021 showed LVEF 20 to 25% with left ventricle global hypokinesis, grade 2 diastolic dysfunction. Start strict I's and O's and daily weight Management per cardiology. This morning patient with lightheadedness, his blood pressure was in the mid 90s while laying supine, discussed with cardiology, recommendation is to decrease Aldactone, will hold on decreasing Imdur (it was recently increased to 60 mg oral daily given his symptomatic chest pain).  Type 2 diabetes with hyperglycemia Hemoglobin A1c 8.2 on 10/24/2021 Continue insulin sliding scale Avoid hypoglycemia  Chronic hypoxic respiratory failure secondary to emphysema/small cell lung cancer On 3 L oxygen by nasal cannula continuously at baseline. Maintain O2 saturation greater than 92%. Continue home regimen, bronchodilators. Out of bed to chair with every shift Mobilize as tolerated.  Moderate protein calorie malnutrition BMI 26 Continue Megace for appetite stimulant Encourage increase in oral protein calorie intake.  Non-small cell lung cancer status post radiation Continue home regimen/bronchodilators  Physical debility/generalized weakness PT OT assessment Mobilize as tolerated with assistance. Fall precautions.    Code Status: DNR  Family Communication: Discussed with grandson and wife at bedside.  Disposition Plan: Likely will discharge to home with home health services versus rehab once cardiology signs off.   Consultants: Cardiology  Procedures: None.  Antimicrobials: P.o. azithromycin 3 times a week, prior to admission.  DVT prophylaxis: Heparin  drip.  Status is: Inpatient  Inpatient status.  Patient will  require at least 2 midnights for further evaluation and treatment of present condition.      Objective: Vitals:   11/07/21 0351 11/07/21 0352 11/07/21 0751 11/07/21 1144  BP: 134/64  (!) 143/79 (!) 109/48  Pulse: 79  91 71  Resp:   18 19  Temp: 98.1 F (36.7 C)  98.3 F (36.8 C) (!) 97.4 F (36.3 C)  TempSrc: Oral     SpO2: 95%  97% 95%  Weight:  82.4 kg    Height:        Intake/Output Summary (Last 24 hours) at 11/07/2021 1518 Last data filed at 11/07/2021 1144 Gross per 24 hour  Intake 566.2 ml  Output 300 ml  Net 266.2 ml   Filed Weights   11/05/21 0310 11/06/21 1657 11/07/21 0352  Weight: 81.6 kg 83.3 kg 82.4 kg    Exam:  Awake Alert, Oriented X 3, No new F.N deficits, Normal affect, frail, chronically ill-appearing. Symmetrical Chest wall movement, Good air movement bilaterally, CTAB RRR,No Gallops,Rubs or new Murmurs, No Parasternal Heave +ve B.Sounds, Abd Soft, No tenderness, No rebound - guarding or rigidity. No Cyanosis, Clubbing or edema, No new Rash or bruise      Data Reviewed: CBC: Recent Labs  Lab 11/05/21 0315 11/06/21 0413 11/07/21 0552  WBC 5.4 6.1 5.1  NEUTROABS 4.0  --   --   HGB 10.9* 12.2* 10.7*  HCT 33.5* 37.1* 32.2*  MCV 85.0 83.9 83.2  PLT 269 277 762   Basic Metabolic Panel: Recent Labs  Lab 11/05/21 0315 11/06/21 0413 11/07/21 0552  NA 134* 138 139  K 5.1 4.2 4.4  CL 108 110 111  CO2 16* 20* 21*  GLUCOSE 245* 153* 146*  BUN 32* 26* 24*  CREATININE 1.42* 1.00 1.10  CALCIUM 9.3 9.3 9.3  MG  --   --  1.9  PHOS  --   --  3.5   GFR: Estimated Creatinine Clearance: 46.4 mL/min (by C-G formula based on SCr of 1.1 mg/dL). Liver Function Tests: Recent Labs  Lab 11/05/21 0315 11/07/21 0552  AST 23 21  ALT 16 17  ALKPHOS 52 45  BILITOT 0.9 0.4  PROT 6.8 6.5  ALBUMIN 3.2* 3.3*   No results for input(s): LIPASE, AMYLASE in the last 168 hours. No  results for input(s): AMMONIA in the last 168 hours. Coagulation Profile: Recent Labs  Lab 11/05/21 0710  INR 1.2   Cardiac Enzymes: No results for input(s): CKTOTAL, CKMB, CKMBINDEX, TROPONINI in the last 168 hours. BNP (last 3 results) No results for input(s): PROBNP in the last 8760 hours. HbA1C: No results for input(s): HGBA1C in the last 72 hours. CBG: Recent Labs  Lab 11/06/21 1437 11/06/21 1657 11/06/21 2112 11/07/21 0752 11/07/21 1156  GLUCAP 178* 135* 161* 141* 271*   Lipid Profile: No results for input(s): CHOL, HDL, LDLCALC, TRIG, CHOLHDL, LDLDIRECT in the last 72 hours. Thyroid Function Tests: No results for input(s): TSH, T4TOTAL, FREET4, T3FREE, THYROIDAB in the last 72 hours. Anemia Panel: No results for input(s): VITAMINB12, FOLATE, FERRITIN, TIBC, IRON, RETICCTPCT in the last 72 hours. Urine analysis:    Component Value Date/Time   COLORURINE YELLOW (A) 08/30/2019 1718   APPEARANCEUR CLEAR (A) 08/30/2019 1718   APPEARANCEUR Clear 05/07/2016 1026   LABSPEC 1.029 08/30/2019 1718   LABSPEC 1.015 11/11/2014 0450   PHURINE 6.0 08/30/2019 1718   GLUCOSEU NEGATIVE 08/30/2019 1718   GLUCOSEU Negative 11/11/2014 0450   HGBUR NEGATIVE 08/30/2019 1718   BILIRUBINUR NEGATIVE 08/30/2019  [place] Negative 05/07/2016 1026   BILIRUBINUR Negative 11/11/2014 0450   KETONESUR 20 (A) 08/30/2019 1718   PROTEINUR NEGATIVE 08/30/2019 1718   UROBILINOGEN 0.2 04/18/2016 1057   UROBILINOGEN 0.2 06/15/2010 0942   NITRITE NEGATIVE 08/30/2019 1718   LEUKOCYTESUR NEGATIVE 08/30/2019 1718   LEUKOCYTESUR Negative 11/11/2014 0450   Sepsis Labs: @LABRCNTIP (procalcitonin:4,lacticidven:4)  ) Recent Results (from the past 240 hour(s))  Resp Panel by RT-PCR (Flu A&B, Covid) Nasopharyngeal Swab     Status: None   Collection Time: 11/05/21  3:15 AM   Specimen: Nasopharyngeal Swab; Nasopharyngeal(NP) swabs in vial transport medium  Result Value Ref Range Status   SARS  Coronavirus 2 by RT PCR NEGATIVE NEGATIVE Final    Comment: (NOTE) SARS-CoV-2 target nucleic acids are NOT DETECTED.  The SARS-CoV-2 RNA is generally detectable in upper respiratory specimens during the acute phase of infection. The lowest concentration of SARS-CoV-2 viral copies this assay can detect is 138 copies/mL. A negative result does not preclude SARS-Cov-2 infection and should not be used as the sole basis for treatment or other patient management decisions. A negative result may occur with  improper specimen collection/handling, submission of specimen other than nasopharyngeal swab, presence of viral mutation(s) within the areas targeted by this assay, and inadequate number of viral copies(<138 copies/mL). A negative result must be combined with clinical observations, patient history, and epidemiological information. The expected result is Negative.  Fact Sheet for Patients:  EntrepreneurPulse.com.au  Fact Sheet for Healthcare Providers:  IncredibleEmployment.be  This test is no t yet approved or cleared by the [place] FDA and  has been authorized for detection and/or diagnosis of SARS-CoV-2 by FDA under an Emergency Use Authorization (EUA). This EUA will remain  in effect (meaning this test can be used) for the duration of the COVID-19 declaration under Section 564(b)(1) of the Act, 21 U.S.C.section 360bbb-3(b)(1), unless the authorization is terminated  or revoked sooner.       Influenza A by PCR NEGATIVE NEGATIVE Final   Influenza B by PCR NEGATIVE NEGATIVE Final    Comment: (NOTE) The Xpert Xpress SARS-CoV-2/FLU/RSV plus assay is intended as an aid in the diagnosis of influenza from Nasopharyngeal swab specimens and should not be used as a sole basis for treatment. Nasal washings and aspirates are unacceptable for Xpert Xpress SARS-CoV-2/FLU/RSV testing.  Fact Sheet for  Patients: EntrepreneurPulse.com.au  Fact Sheet for Healthcare Providers: IncredibleEmployment.be  This test is not yet approved or cleared by the [place] FDA and has been authorized for detection and/or diagnosis of SARS-CoV-2 by FDA under an Emergency Use Authorization (EUA). This EUA will remain in effect (meaning this test can be used) for the duration of the COVID-19 declaration under Section 564(b)(1) of the Act, 21 U.S.C. section 360bbb-3(b)(1), unless the authorization is terminated or revoked.  Performed at [facility], [address] [place], [place] [address]   Gastrointestinal Panel by PCR , Stool     Status: None   Collection Time: 11/07/21  4:02 AM   Specimen: Stool  Result Value Ref Range Status   Campylobacter species NOT DETECTED NOT DETECTED Final   Plesimonas shigelloides NOT DETECTED NOT DETECTED Final   Salmonella species NOT DETECTED NOT DETECTED Final   Yersinia enterocolitica NOT DETECTED NOT DETECTED Final   Vibrio species NOT DETECTED NOT DETECTED Final   Vibrio cholerae NOT DETECTED NOT DETECTED Final   Enteroaggregative E coli (EAEC) NOT DETECTED NOT DETECTED Final   Enteropathogenic E coli (EPEC) NOT DETECTED  NOT DETECTED Final   Enterotoxigenic E coli (ETEC) NOT DETECTED NOT DETECTED Final   Shiga like toxin producing E coli (STEC) NOT DETECTED NOT DETECTED Final   Shigella/Enteroinvasive E coli (EIEC) NOT DETECTED NOT DETECTED Final   Cryptosporidium NOT DETECTED NOT DETECTED Final   Cyclospora cayetanensis NOT DETECTED NOT DETECTED Final   Entamoeba histolytica NOT DETECTED NOT DETECTED Final   Giardia lamblia NOT DETECTED NOT DETECTED Final   Adenovirus F40/41 NOT DETECTED NOT DETECTED Final   Astrovirus NOT DETECTED NOT DETECTED Final   Norovirus GI/GII NOT DETECTED NOT DETECTED Final   Rotavirus A NOT DETECTED NOT DETECTED Final   Sapovirus (I, II, IV, and V) NOT DETECTED NOT DETECTED Final     Comment: Performed at [facility], [address] [place], [place] [address]  C Difficile Quick Screen w PCR reflex     Status: None   Collection Time: 11/07/21  4:02 AM   Specimen: Stool  Result Value Ref Range Status   C Diff antigen NEGATIVE NEGATIVE Final   C Diff toxin NEGATIVE NEGATIVE Final   C Diff interpretation No C. difficile detected.  Final    Comment: Performed at [facility], [address] [place], [place] [address]      Studies: US RENAL  Result Date: 11/06/2021 CLINICAL DATA:  An area. EXAM: RENAL / URINARY TRACT ULTRASOUND COMPLETE COMPARISON:  February 23, 2014 FINDINGS: Right Kidney: Renal measurements: 11.7 cm x 4.1 cm x 4.2 cm = volume: 106 mL. Diffusely increased echogenicity of the renal parenchyma is noted. No mass or hydronephrosis visualized. Left Kidney: Renal measurements: 12.1 cm x 6.1 cm x 5.5 cm = volume: 212 mL. Echogenicity within normal limits. A 2.7 cm x 1.5 cm x 2.2 cm well-defined, anechoic structure is seen within the lower pole of the left kidney. No abnormal flow is seen within this region on color Doppler evaluation. No hydronephrosis is visualized. Bladder: Appears normal for degree of bladder distention. Other: A mild amount of right-sided perirenal fluid is noted. IMPRESSION: 1. Increased echogenicity of the right kidney and perirenal fluid, which may be secondary to an infectious or inflammatory process such as acute pyelonephritis. Correlation with urinalysis is recommended. 2. Simple left renal cyst. No additional follow-up or imaging is recommended. Electronically Signed   By: Virgina Norfolk M.D.   On: 11/06/2021 16:41    Scheduled Meds:  apixaban  5 mg Oral BID   ferrous sulfate  325 mg Oral Q breakfast   And   vitamin C  250 mg Oral Q breakfast   aspirin EC  81 mg Oral Daily   azithromycin  250 mg Oral Once per day on Mon Wed Fri   cholecalciferol  1,000 Units Oral Daily   donepezil  5 mg Oral Daily   ezetimibe   10 mg Oral Daily   fluticasone furoate-vilanterol  1 puff Inhalation Daily   And   umeclidinium bromide  1 puff Inhalation Daily   furosemide  20 mg Oral Daily   insulin aspart  0-15 Units Subcutaneous TID WC   isosorbide mononitrate  60 mg Oral Daily   loratadine  10 mg Oral Daily   mouth rinse  15 mL Mouth Rinse BID   megestrol  40 mg Oral BID   metoprolol succinate  25 mg Oral QHS   multivitamin with minerals  1 tablet Oral Daily   pantoprazole  40 mg Oral QHS   sacubitril-valsartan  1 tablet Oral BID  sertraline  50 mg Oral QHS   simvastatin  40 mg Oral QHS   sodium bicarbonate  650 mg Oral TID   [START ON 11/08/2021] spironolactone  12.5 mg Oral Daily    Continuous Infusions:  heparin 900 Units/hr (11/07/21 0922)     LOS: 2 days     Phillips Climes, MD [facility] Pager [phone]  If 7PM-7AM, please contact night-coverage [email or web address] Password [facility] 11/07/2021, 3:18 PM

## 2021-11-07 NOTE — Evaluation (Signed)
Physical Therapy Evaluation Patient Details Name: Howard [name] MRN: [id number] DOB: 1931/05/14 Today's Date: 11/07/2021  History of Present Illness  Howard [name] is an 51yoM who comes to [place] [facility]. Was here recently for PNA and NSTEMI. PMH: syncope, HOH, skin CA, heart block s/ ppm, depression, DM, PrCA, HLD, HTN, LungCA, NICM, imbalance and falls, OSA on CPAP, vertigo, CRF on 3L baseline. PT admitted for acute nSTEMI. Pt not appropriate for cardiac cath, started on heparin AC. PTA pt recently had finished cardiac rehab. Pt familiar to our services from recent acute care, outpatien tPT, and cardiac rehab.  Clinical Impression  Pt admitted with above diagnosis. Pt currently with functional limitations due to the deficits listed below (see "PT Problem List"). Patient agreeable to PT evaluation. Patient provides detailed description of PLOF and home environment, no significant changes since prior admission. Pt able to perform bed mobility and transfers at supervision level, takes a few steps to recliner, but ultimately is dizzy upon standing- resolves after return to sitting. RN in room to give pain medication. Will advance to overground AMB and stairs assessment as able. Pt left up in chair with nephew and RN in room. Patient's assessment this date reveals the patient requires an additional person present for safety and/or physical assistance to complete their typical ADL. At baseline, the patient is able to perform ADL with modified independence. Patient will benefit from skilled PT intervention to maximize independence and safety in mobility required for basic ADL performance at discharge.       Recommendations for follow up therapy are one component of a multi-disciplinary discharge planning process, led by the attending physician.  Recommendations may be updated based on patient status, additional functional criteria and insurance authorization.  Follow Up Recommendations Home health PT     Assistance Recommended at Discharge Intermittent Supervision/Assistance  Functional Status Assessment Patient has had a recent decline in their functional status and demonstrates the ability to make significant improvements in function in a reasonable and predictable amount of time.  Equipment Recommendations  None recommended by PT    Recommendations for Other Services       Precautions / Restrictions Precautions Precautions: Fall      Mobility  Bed Mobility Overal bed mobility: Needs Assistance Bed Mobility: Supine to Sit     Supine to sit: Modified independent (Device/Increase time)     General bed mobility comments: Rt EOB    Transfers Overall transfer level: Needs assistance Equipment used: Rolling walker (2 wheels);None   Sit to Stand: Supervision           General transfer comment: for safety, pt fixated on situating diaper and gown, not attending to IV/O2 line.    Ambulation/Gait Ambulation/Gait assistance: Supervision Gait Distance (Feet): 3 Feet Assistive device: Rolling walker (2 wheels)         General Gait Details: stepr from EOB to recliner, but no further, pt has dizziness upon standing. HR/SpO2 WNL. Pt has history of syncope.  Stairs            Wheelchair Mobility    Modified Rankin (Stroke Patients Only)       Balance                                             Pertinent Vitals/Pain Pain Assessment: No/denies pain    Home Living Family/patient expects to be  discharged to:: Private residence Living Arrangements: Spouse/significant other Available Help at Discharge: Family;Available 24 hours/day Type of Home: House Home Access: Stairs to enter Entrance Stairs-Rails: Right;Left;Can reach both Entrance Stairs-Number of Steps: 3   Home Layout: Laundry or work area in basement;One level Home Equipment: Rollator (4 wheels)      Prior Function Prior Level of Function : Independent/Modified  Independent;History of Falls (last six months)             Mobility Comments: has not driven in last 6 mo, walks with AD intermittently, has been seeing OP PT to improve balance ([facility] [address]) ADLs Comments: Can perform all basic ADLs at baseline, some IADLs, but does endorse increased SOB with bending/lifting tasks.     Hand Dominance        Extremity/Trunk Assessment                Communication      Cognition Arousal/Alertness: Awake/alert Behavior During Therapy: WFL for tasks assessed/performed Overall Cognitive Status: Within Functional Limits for tasks assessed                                          General Comments      Exercises     Assessment/Plan    PT Assessment Patient needs continued PT services  PT Problem List Decreased strength;Decreased mobility;Decreased balance;Decreased knowledge of precautions;Decreased safety awareness;Decreased activity tolerance       PT Treatment Interventions Therapeutic activities;Gait training;Therapeutic exercise;Functional mobility training;Stair training;Patient/family education    PT Goals (Current goals can be found in the Care Plan section)  Acute Rehab PT Goals Patient Stated Goal: regain strength and mobility PT Goal Formulation: With patient Time For Goal Achievement: 11/21/21 Potential to Achieve Goals: Fair    Frequency Min 2X/week   Barriers to discharge        Co-evaluation               AM-PAC PT "6 Clicks" Mobility  Outcome Measure Help needed turning from your back to your side while in a flat bed without using bedrails?: A Little Help needed moving from lying on your back to sitting on the side of a flat bed without using bedrails?: A Little Help needed moving to and from a bed to a chair (including a wheelchair)?: A Little Help needed standing up from a chair using your arms (e.g., wheelchair or bedside chair)?: A Little Help needed to walk in  hospital room?: A Little Help needed climbing 3-5 steps with a railing? : A Little 6 Click Score: 18    End of Session Equipment Utilized During Treatment: Oxygen Activity Tolerance: Patient tolerated treatment well;No increased pain;Treatment limited secondary to medical complications (Comment) Patient left: with family/visitor present;in chair;with nursing/sitter in room;with call bell/phone within reach Nurse Communication: Mobility status PT Visit Diagnosis: Unsteadiness on feet (R26.81);Other abnormalities of gait and mobility (R26.89);Difficulty in walking, not elsewhere classified (R26.2);Dizziness and giddiness (R42)    Time: 6754-4920 PT Time Calculation (min) (ACUTE ONLY): 16 min   Charges:   PT Evaluation $PT Eval High Complexity: 1 High        9:24 AM, 11/07/21 [name], PT, DPT Physical Therapist - [facility]  [phone] ([place])    Leina Babe C 11/07/2021, 9:22 AM

## 2021-11-08 ENCOUNTER — Encounter: Payer: Medicare Other | Admitting: Physical Therapy

## 2021-11-08 DIAGNOSIS — E1122 Type 2 diabetes mellitus with diabetic chronic kidney disease: Secondary | ICD-10-CM

## 2021-11-08 DIAGNOSIS — K591 Functional diarrhea: Secondary | ICD-10-CM

## 2021-11-08 DIAGNOSIS — I442 Atrioventricular block, complete: Secondary | ICD-10-CM | POA: Diagnosis not present

## 2021-11-08 DIAGNOSIS — R778 Other specified abnormalities of plasma proteins: Secondary | ICD-10-CM

## 2021-11-08 DIAGNOSIS — I25118 Atherosclerotic heart disease of native coronary artery with other forms of angina pectoris: Secondary | ICD-10-CM

## 2021-11-08 DIAGNOSIS — N183 Chronic kidney disease, stage 3 unspecified: Secondary | ICD-10-CM

## 2021-11-08 LAB — CBC
HCT: 30.4 % — ABNORMAL LOW (ref 39.0–52.0)
Hemoglobin: 10.2 g/dL — ABNORMAL LOW (ref 13.0–17.0)
MCH: 27.9 pg (ref 26.0–34.0)
MCHC: 33.6 g/dL (ref 30.0–36.0)
MCV: 83.1 fL (ref 80.0–100.0)
Platelets: 244 10*3/uL (ref 150–400)
RBC: 3.66 MIL/uL — ABNORMAL LOW (ref 4.22–5.81)
RDW: 16.5 % — ABNORMAL HIGH (ref 11.5–15.5)
WBC: 5.5 10*3/uL (ref 4.0–10.5)
nRBC: 0 % (ref 0.0–0.2)

## 2021-11-08 LAB — URINALYSIS, COMPLETE (UACMP) WITH MICROSCOPIC
Bilirubin Urine: NEGATIVE
Glucose, UA: NEGATIVE mg/dL
Hgb urine dipstick: NEGATIVE
Ketones, ur: 5 mg/dL — AB
Leukocytes,Ua: NEGATIVE
Nitrite: NEGATIVE
Protein, ur: 30 mg/dL — AB
Specific Gravity, Urine: 1.025 (ref 1.005–1.030)
pH: 5 (ref 5.0–8.0)

## 2021-11-08 LAB — BASIC METABOLIC PANEL
Anion gap: 12 (ref 5–15)
BUN: 25 mg/dL — ABNORMAL HIGH (ref 8–23)
CO2: 19 mmol/L — ABNORMAL LOW (ref 22–32)
Calcium: 8.8 mg/dL — ABNORMAL LOW (ref 8.9–10.3)
Chloride: 107 mmol/L (ref 98–111)
Creatinine, Ser: 0.99 mg/dL (ref 0.61–1.24)
GFR, Estimated: >60 mL/min (ref >60)
Glucose, Bld: 156 mg/dL — ABNORMAL HIGH (ref 70–99)
Potassium: 3.7 mmol/L (ref 3.5–5.1)
Sodium: 138 mmol/L (ref 135–145)

## 2021-11-08 LAB — GLUCOSE, CAPILLARY
Glucose-Capillary: 149 mg/dL — ABNORMAL HIGH (ref 70–99)
Glucose-Capillary: 179 mg/dL — ABNORMAL HIGH (ref 70–99)
Glucose-Capillary: 159 mg/dL — ABNORMAL HIGH (ref 70–99)
Glucose-Capillary: 181 mg/dL — ABNORMAL HIGH (ref 70–99)

## 2021-11-08 LAB — HEPARIN LEVEL (UNFRACTIONATED): Heparin Unfractionated: >1.1 IU/mL — ABNORMAL HIGH (ref 0.30–0.70)

## 2021-11-08 LAB — APTT: aPTT: 43 seconds — ABNORMAL HIGH (ref 24–36)

## 2021-11-08 MED ORDER — RISAQUAD PO CAPS
1.0000 | ORAL_CAPSULE | Freq: Every day | ORAL | Status: DC
  Administered 2021-11-08 – 2021-11-09 (×2): 1 via ORAL
  Filled 2021-11-08 (×2): qty 1

## 2021-11-08 NOTE — Progress Notes (Signed)
Physical Therapy Treatment Patient Details Name: Howard [name] MRN: [id number] DOB: 11/09/1931 Today's Date: 11/08/2021   History of Present Illness Howard [name] is an 70yoM who comes to [place] [facility]. Was here recently for PNA and NSTEMI. PMH: syncope, HOH, skin CA, heart block s/ ppm, depression, DM, PrCA, HLD, HTN, LungCA, NICM, imbalance and falls, OSA on CPAP, vertigo, CRF on 3L baseline. PT admitted for acute nSTEMI. Pt not appropriate for cardiac cath, started on heparin AC. PTA pt recently had finished cardiac rehab. Pt familiar to our services from recent acute care, outpatien tPT, and cardiac rehab.    PT Comments    Pt in bed finishing breakfast, wife at bedside. Pt doffed Gem for eating, no SOB, sats at 94%. Pt able to AMB twice up to 144ft with terminal sats at 94% SpO2. Pt is on O2 at baseline for other diagnoses, followed by pulmonology, but recently PTA was having difficulty maintaining saturations, hence today demonstrating improved capacity to maintain sats. Pt AMB 3 times with RW, up to 15oft, is winded after each, with current data available author would correlate cardiac etiology- near baseline per patient/wife. Rates well controlled with activity. Pt is safe for mobilization in home at this point.     Recommendations for follow up therapy are one component of a multi-disciplinary discharge planning process, led by the attending physician.  Recommendations may be updated based on patient status, additional functional criteria and insurance authorization.  Follow Up Recommendations  Home health PT     Assistance Recommended at Discharge Intermittent Supervision/Assistance  Equipment Recommendations  None recommended by PT    Recommendations for Other Services       Precautions / Restrictions Precautions Precautions: Fall     Mobility  Bed Mobility Overal bed mobility: Modified Independent                  Transfers Overall transfer level: Needs  assistance Equipment used: Rolling walker (2 wheels) Transfers: Sit to/from Stand Sit to Stand: Supervision           General transfer comment: no dizziness today    Ambulation/Gait Ambulation/Gait assistance: Min guard Gait Distance (Feet): 150 Feet Assistive device: Rolling walker (2 wheels) Gait Pattern/deviations: Step-through pattern;WFL(Within Functional Limits);Trunk flexed Gait velocity: 0.37m/s     General Gait Details: twice on room air, once on 2L; =terminal saturations and dyspnea; suspect due to cardiac ischaemia in the setting of reduced EF   Stairs             Wheelchair Mobility    Modified Rankin (Stroke Patients Only)       Balance Overall balance assessment: History of Falls;Mild deficits observed, not formally tested   Sitting balance-Leahy Scale: Good Sitting balance - Comments: good sitting balance at EOB reaching within BOS                                    Cognition Arousal/Alertness: Awake/alert Behavior During Therapy: WFL for tasks assessed/performed Overall Cognitive Status: Within Functional Limits for tasks assessed                                          Exercises      General Comments        Pertinent Vitals/Pain      Home Living  Prior Function            PT Goals (current goals can now be found in the care plan section) Acute Rehab PT Goals Patient Stated Goal: regain strength and mobility PT Goal Formulation: With patient Time For Goal Achievement: 11/21/21 Potential to Achieve Goals: Good Progress towards PT goals: Progressing toward goals    Frequency    Min 2X/week      PT Plan Current plan remains appropriate    Co-evaluation              AM-PAC PT "6 Clicks" Mobility   Outcome Measure  Help needed turning from your back to your side while in a flat bed without using bedrails?: A Little Help needed moving from  lying on your back to sitting on the side of a flat bed without using bedrails?: A Little Help needed moving to and from a bed to a chair (including a wheelchair)?: A Little Help needed standing up from a chair using your arms (e.g., wheelchair or bedside chair)?: A Little Help needed to walk in hospital room?: A Little Help needed climbing 3-5 steps with a railing? : A Little 6 Click Score: 18    End of Session Equipment Utilized During Treatment: Oxygen Activity Tolerance: Patient tolerated treatment well;No increased pain;Patient limited by fatigue Patient left: with family/visitor present;in chair;with nursing/sitter in room;with call bell/phone within reach Nurse Communication: Mobility status PT Visit Diagnosis: Unsteadiness on feet (R26.81);Other abnormalities of gait and mobility (R26.89);Difficulty in walking, not elsewhere classified (R26.2);Dizziness and giddiness (R42)     Time: 7681-1572 PT Time Calculation (min) (ACUTE ONLY): 41 min  Charges:  $Therapeutic Exercise: 38-52 mins                    9:39 AM, 11/08/21 Howard [name], PT, DPT Physical Therapist - [facility]  [phone] ([place])     Howard [name] C 11/08/2021, 9:36 AM

## 2021-11-08 NOTE — Progress Notes (Signed)
PROGRESS NOTE  Howard [name] NAT:[id number] DOB: 01/13/1931 DOA: 11/05/2021 PCP: Jerrol Banana., MD  HPI:  Howard [name] is a 87 y.o. male with medical history significant for HFrEF 20-25%, chronic combined diastolic and systolic CHF, type 2 diabetes, GERD, hypertension, hyperlipidemia, non-small cell lung cancer s/p radiation, chronic respiratory failure on 3 L nasal cannula continuously, emphysema, complete heart block status post pacemaker placement, CAD, recently discharged from the hospital on 10/28/21 for acute on chronic respiratory failure secondary to pneumonia as well as NSTEMI, who presents to [facility] ED from home with complaints of chest pain that woke him up out of his sleep early morning of 11/05/21.  His wife reports worsening generalized weakness since his discharge, using a walker to avoid falls, and stool incontinence with multiple accidents at home.    Work-up revealed  chest pain with elevated troponin for he was started on heparin drip and cardiology consulted.  Seen by cardiology, recommended conservative management.   Assessment/Plan: Principal Problem:   NSTEMI (non-ST elevated myocardial infarction) ([place]) Active Problems:   CAD (coronary artery disease)   Complete atrioventricular block (HCC)   Malignant neoplasm of right lung (HCC)   Chronic systolic congestive heart failure ([place])   CKD stage 3 due to type 2 diabetes mellitus ([place])  NSTEMI - Recently discharged from [facility] after admission for NSTEMI on 10/28/2021 - troponin peaked at 1077. -Cardiology input greatly appreciated, current recommendation for conservative management, and medication optimization, Imdur was increased to 60 mg daily, it did give significant relief with chest pain, continue with Toprol, aspirin and statin. -He was treated with heparin GTT, currently transitioned to Eliquis.   - Last 2D echo done on 10/25/2021 showed LVEF 20 to 25% with left ventricle global hypokinesis, grade 2  diastolic dysfunction. - Continue to monitor on telemetry. -Will benefit from sublingual nitro on discharge  Loose stools with incontinence, unclear etiology Patient is on p.o. azithromycin 3 times a week, which could contribute. No evidence of infectious etiology, negative GI panel and C. difficile, it has been improving after he was started on Imodium  Chronic combined diastolic and systolic CHF Euvolemic on exam. Last 2D echo done on 10/25/2021 showed LVEF 20 to 25% with left ventricle global hypokinesis, grade 2 diastolic dysfunction. Start strict I's and O's and daily weight Management per cardiology. -Dizziness with some lightheadedness and soft blood pressure after increasing Imdur, this has all improved after decreasing his Aldactone.  Type 2 diabetes with hyperglycemia Hemoglobin A1c 8.2 on 10/24/2021 Continue insulin sliding scale Avoid hypoglycemia  Chronic hypoxic respiratory failure secondary to emphysema/small cell lung cancer On 3 L oxygen by nasal cannula continuously at baseline. Maintain O2 saturation greater than 92%. Continue home regimen, bronchodilators. Out of bed to chair with every shift Mobilize as tolerated.  Moderate protein calorie malnutrition BMI 26 Continue Megace for appetite stimulant Encourage increase in oral protein calorie intake.  Non-small cell lung cancer status post radiation Continue home regimen/bronchodilators  Physical debility/generalized weakness PT OT assessment Mobilize as tolerated with assistance. Fall precautions.    Code Status: DNR  Family Communication: Discussed with wife at bedside  Disposition Plan: Likely will discharge to home with home health services versus rehab once cardiology signs off.   Consultants: Cardiology  Procedures: None.  Antimicrobials: P.o. azithromycin 3 times a week, prior to admission.  DVT prophylaxis: Eliquis  Status is: Inpatient  Inpatient status.  Patient will require at  least 2 midnights for further evaluation and treatment of  present condition.   Subjective:  Patient denies any dizziness or lightheadedness today, he did well with PT today, he does report 2 BM yesterday, and 1 BM overnight, but overall it did significantly improved.   Objective: Vitals:   11/07/21 2000 11/08/21 0500 11/08/21 0832 11/08/21 1147  BP: (!) 122/57  129/71 (!) 114/55  Pulse: 84  81 88  Resp: 16  18 18   Temp: 98.1 F (36.7 C)  97.7 F (36.5 C) 97.7 F (36.5 C)  TempSrc:      SpO2: 97%  92% 93%  Weight:  82.2 kg    Height:        Intake/Output Summary (Last 24 hours) at 11/08/2021 1544 Last data filed at 11/08/2021 1345 Gross per 24 hour  Intake 706.05 ml  Output 0 ml  Net 706.05 ml   Filed Weights   11/06/21 1657 11/07/21 0352 11/08/21 0500  Weight: 83.3 kg 82.4 kg 82.2 kg    Exam:  Awake Alert, Oriented X 3, No new F.N deficits, Normal affect Symmetrical Chest wall movement, Good air movement bilaterally, CTAB RRR,No Gallops,Rubs or new Murmurs, No Parasternal Heave +ve B.Sounds, Abd Soft, No tenderness, No rebound - guarding or rigidity. No Cyanosis, Clubbing or edema, No new Rash or bruise       Data Reviewed: CBC: Recent Labs  Lab 11/05/21 0315 11/06/21 0413 11/07/21 0552 11/08/21 0432  WBC 5.4 6.1 5.1 5.5  NEUTROABS 4.0  --   --   --   HGB 10.9* 12.2* 10.7* 10.2*  HCT 33.5* 37.1* 32.2* 30.4*  MCV 85.0 83.9 83.2 83.1  PLT 269 277 266 401   Basic Metabolic Panel: Recent Labs  Lab 11/05/21 0315 11/06/21 0413 11/07/21 0552 11/08/21 0432  NA 134* 138 139 138  K 5.1 4.2 4.4 3.7  CL 108 110 111 107  CO2 16* 20* 21* 19*  GLUCOSE 245* 153* 146* 156*  BUN 32* 26* 24* 25*  CREATININE 1.42* 1.00 1.10 0.99  CALCIUM 9.3 9.3 9.3 8.8*  MG  --   --  1.9  --   PHOS  --   --  3.5  --    GFR: Estimated Creatinine Clearance: 51.6 mL/min (by C-G formula based on SCr of 0.99 mg/dL). Liver Function Tests: Recent Labs  Lab 11/05/21 0315  11/07/21 0552  AST 23 21  ALT 16 17  ALKPHOS 52 45  BILITOT 0.9 0.4  PROT 6.8 6.5  ALBUMIN 3.2* 3.3*   No results for input(s): LIPASE, AMYLASE in the last 168 hours. No results for input(s): AMMONIA in the last 168 hours. Coagulation Profile: Recent Labs  Lab 11/05/21 0710  INR 1.2   Cardiac Enzymes: No results for input(s): CKTOTAL, CKMB, CKMBINDEX, TROPONINI in the last 168 hours. BNP (last 3 results) No results for input(s): PROBNP in the last 8760 hours. HbA1C: No results for input(s): HGBA1C in the last 72 hours. CBG: Recent Labs  Lab 11/07/21 1156 11/07/21 1623 11/07/21 2052 11/08/21 0829 11/08/21 1146  GLUCAP 271* 170* 156* 149* 179*   Lipid Profile: No results for input(s): CHOL, HDL, LDLCALC, TRIG, CHOLHDL, LDLDIRECT in the last 72 hours. Thyroid Function Tests: No results for input(s): TSH, T4TOTAL, FREET4, T3FREE, THYROIDAB in the last 72 hours. Anemia Panel: No results for input(s): VITAMINB12, FOLATE, FERRITIN, TIBC, IRON, RETICCTPCT in the last 72 hours. Urine analysis:    Component Value Date/Time   COLORURINE YELLOW (A) 11/08/2021 0724   APPEARANCEUR HAZY (A) 11/08/2021 0724   APPEARANCEUR Clear 05/07/2016  1026   LABSPEC 1.025 11/08/2021 0724   LABSPEC 1.015 11/11/2014 0450   PHURINE 5.0 11/08/2021 0724   GLUCOSEU NEGATIVE 11/08/2021 0724   GLUCOSEU Negative 11/11/2014 [place] 11/08/2021 0724   BILIRUBINUR NEGATIVE 11/08/2021 0724   BILIRUBINUR Negative 05/07/2016 1026   BILIRUBINUR Negative 11/11/2014 0450   KETONESUR 5 (A) 11/08/2021 0724   PROTEINUR 30 (A) 11/08/2021 0724   UROBILINOGEN 0.2 04/18/2016 1057   UROBILINOGEN 0.2 06/15/2010 0942   NITRITE NEGATIVE 11/08/2021 0724   LEUKOCYTESUR NEGATIVE 11/08/2021 0724   LEUKOCYTESUR Negative 11/11/2014 0450   Sepsis Labs: @LABRCNTIP (procalcitonin:4,lacticidven:4)  ) Recent Results (from the past 240 hour(s))  Resp Panel by RT-PCR (Flu A&B, Covid) Nasopharyngeal Swab      Status: None   Collection Time: 11/05/21  3:15 AM   Specimen: Nasopharyngeal Swab; Nasopharyngeal(NP) swabs in vial transport medium  Result Value Ref Range Status   SARS Coronavirus 2 by RT PCR NEGATIVE NEGATIVE Final    Comment: (NOTE) SARS-CoV-2 target nucleic acids are NOT DETECTED.  The SARS-CoV-2 RNA is generally detectable in upper respiratory specimens during the acute phase of infection. The lowest concentration of SARS-CoV-2 viral copies this assay can detect is 138 copies/mL. A negative result does not preclude SARS-Cov-2 infection and should not be used as the sole basis for treatment or other patient management decisions. A negative result may occur with  improper specimen collection/handling, submission of specimen other than nasopharyngeal swab, presence of viral mutation(s) within the areas targeted by this assay, and inadequate number of viral copies(<138 copies/mL). A negative result must be combined with clinical observations, patient history, and epidemiological information. The expected result is Negative.  Fact Sheet for Patients:  EntrepreneurPulse.com.au  Fact Sheet for Healthcare Providers:  IncredibleEmployment.be  This test is no t yet approved or cleared by the [place] FDA and  has been authorized for detection and/or diagnosis of SARS-CoV-2 by FDA under an Emergency Use Authorization (EUA). This EUA will remain  in effect (meaning this test can be used) for the duration of the COVID-19 declaration under Section 564(b)(1) of the Act, 21 U.S.C.section 360bbb-3(b)(1), unless the authorization is terminated  or revoked sooner.       Influenza A by PCR NEGATIVE NEGATIVE Final   Influenza B by PCR NEGATIVE NEGATIVE Final    Comment: (NOTE) The Xpert Xpress SARS-CoV-2/FLU/RSV plus assay is intended as an aid in the diagnosis of influenza from Nasopharyngeal swab specimens and should not be used as a sole basis  for treatment. Nasal washings and aspirates are unacceptable for Xpert Xpress SARS-CoV-2/FLU/RSV testing.  Fact Sheet for Patients: EntrepreneurPulse.com.au  Fact Sheet for Healthcare Providers: IncredibleEmployment.be  This test is not yet approved or cleared by the [place] FDA and has been authorized for detection and/or diagnosis of SARS-CoV-2 by FDA under an Emergency Use Authorization (EUA). This EUA will remain in effect (meaning this test can be used) for the duration of the COVID-19 declaration under Section 564(b)(1) of the Act, 21 U.S.C. section 360bbb-3(b)(1), unless the authorization is terminated or revoked.  Performed at [facility], [address] [place], [place] [address]   Gastrointestinal Panel by PCR , Stool     Status: None   Collection Time: 11/07/21  4:02 AM   Specimen: Stool  Result Value Ref Range Status   Campylobacter species NOT DETECTED NOT DETECTED Final   Plesimonas shigelloides NOT DETECTED NOT DETECTED Final   Salmonella species NOT DETECTED NOT DETECTED Final   Yersinia  enterocolitica NOT DETECTED NOT DETECTED Final   Vibrio species NOT DETECTED NOT DETECTED Final   Vibrio cholerae NOT DETECTED NOT DETECTED Final   Enteroaggregative E coli (EAEC) NOT DETECTED NOT DETECTED Final   Enteropathogenic E coli (EPEC) NOT DETECTED NOT DETECTED Final   Enterotoxigenic E coli (ETEC) NOT DETECTED NOT DETECTED Final   Shiga like toxin producing E coli (STEC) NOT DETECTED NOT DETECTED Final   Shigella/Enteroinvasive E coli (EIEC) NOT DETECTED NOT DETECTED Final   Cryptosporidium NOT DETECTED NOT DETECTED Final   Cyclospora cayetanensis NOT DETECTED NOT DETECTED Final   Entamoeba histolytica NOT DETECTED NOT DETECTED Final   Giardia lamblia NOT DETECTED NOT DETECTED Final   Adenovirus F40/41 NOT DETECTED NOT DETECTED Final   Astrovirus NOT DETECTED NOT DETECTED Final   Norovirus GI/GII NOT DETECTED NOT  DETECTED Final   Rotavirus A NOT DETECTED NOT DETECTED Final   Sapovirus (I, II, IV, and V) NOT DETECTED NOT DETECTED Final    Comment: Performed at [facility], [address] [place], [place] [address]  C Difficile Quick Screen w PCR reflex     Status: None   Collection Time: 11/07/21  4:02 AM   Specimen: Stool  Result Value Ref Range Status   C Diff antigen NEGATIVE NEGATIVE Final   C Diff toxin NEGATIVE NEGATIVE Final   C Diff interpretation No C. difficile detected.  Final    Comment: Performed at [facility], [address] [place], [place] [address]      Studies: No results found.  Scheduled Meds:  acidophilus  1 capsule Oral Daily   apixaban  5 mg Oral BID   ferrous sulfate  325 mg Oral Q breakfast   And   vitamin C  250 mg Oral Q breakfast   aspirin EC  81 mg Oral Daily   azithromycin  250 mg Oral Once per day on Mon Wed Fri   cholecalciferol  1,000 Units Oral Daily   donepezil  5 mg Oral Daily   ezetimibe  10 mg Oral Daily   fluticasone furoate-vilanterol  1 puff Inhalation Daily   And   umeclidinium bromide  1 puff Inhalation Daily   furosemide  20 mg Oral Daily   insulin aspart  0-15 Units Subcutaneous TID WC   isosorbide mononitrate  60 mg Oral Daily   loratadine  10 mg Oral Daily   mouth rinse  15 mL Mouth Rinse BID   megestrol  40 mg Oral BID   metoprolol succinate  25 mg Oral QHS   multivitamin with minerals  1 tablet Oral Daily   pantoprazole  40 mg Oral QHS   sacubitril-valsartan  1 tablet Oral BID   sertraline  50 mg Oral QHS   simvastatin  40 mg Oral QHS   sodium bicarbonate  650 mg Oral TID   spironolactone  12.5 mg Oral Daily    Continuous Infusions:     LOS: 3 days     Howard Climes, MD [facility] Pager [phone]  If 7PM-7AM, please contact night-coverage [email or web address] Password [email or web address] 11/08/2021, 3:44 PM

## 2021-11-08 NOTE — Progress Notes (Addendum)
Progress Note  Patient Name: Howard [name] Date of Encounter: 11/08/2021  Primary Cardiologist: [name]  Subjective   No chest pain or dyspnea. Diarrhea is improving, though persists.   Inpatient Medications    Scheduled Meds:  apixaban  5 mg Oral BID   ferrous sulfate  325 mg Oral Q breakfast   And   vitamin C  250 mg Oral Q breakfast   aspirin EC  81 mg Oral Daily   azithromycin  250 mg Oral Once per day on Mon Wed Fri   cholecalciferol  1,000 Units Oral Daily   donepezil  5 mg Oral Daily   ezetimibe  10 mg Oral Daily   fluticasone furoate-vilanterol  1 puff Inhalation Daily   And   umeclidinium bromide  1 puff Inhalation Daily   furosemide  20 mg Oral Daily   insulin aspart  0-15 Units Subcutaneous TID WC   isosorbide mononitrate  60 mg Oral Daily   loratadine  10 mg Oral Daily   mouth rinse  15 mL Mouth Rinse BID   megestrol  40 mg Oral BID   metoprolol succinate  25 mg Oral QHS   multivitamin with minerals  1 tablet Oral Daily   pantoprazole  40 mg Oral QHS   sacubitril-valsartan  1 tablet Oral BID   sertraline  50 mg Oral QHS   simvastatin  40 mg Oral QHS   sodium bicarbonate  650 mg Oral TID   spironolactone  12.5 mg Oral Daily   Continuous Infusions:  PRN Meds: acetaminophen, albuterol, loperamide, montelukast, nitroGLYCERIN, ondansetron (ZOFRAN) IV   Vital Signs    Vitals:   11/07/21 1623 11/07/21 2000 11/08/21 0500 11/08/21 0832  BP: (!) 110/55 (!) 122/57  129/71  Pulse: 87 84  81  Resp: 18 16  18   Temp: 98.7 F (37.1 C) 98.1 F (36.7 C)  97.7 F (36.5 C)  TempSrc: Oral     SpO2: 97% 97%  92%  Weight:   82.2 kg   Height:        Intake/Output Summary (Last 24 hours) at 11/08/2021 1036 Last data filed at 11/08/2021 0950 Gross per 24 hour  Intake 466.05 ml  Output 0 ml  Net 466.05 ml   Filed Weights   11/06/21 1657 11/07/21 0352 11/08/21 0500  Weight: 83.3 kg 82.4 kg 82.2 kg    Telemetry    Paced - Personally Reviewed  ECG     No - Personally Reviewed  Physical Exam   GEN: No acute distress.   Neck: No JVD. Cardiac: RRR, no murmurs, rubs, or gallops.  Respiratory: Diminished breath sounds at the bases bilaterally.  GI: Soft, nontender, non-distended.   MS: No edema; No deformity. Neuro:  Alert and oriented x 3; Nonfocal.  Psych: Normal affect.  Labs    Chemistry Recent Labs  Lab 11/05/21 0315 11/06/21 0413 11/07/21 0552 11/08/21 0432  NA 134* 138 139 138  K 5.1 4.2 4.4 3.7  CL 108 110 111 107  CO2 16* 20* 21* 19*  GLUCOSE 245* 153* 146* 156*  BUN 32* 26* 24* 25*  CREATININE 1.42* 1.00 1.10 0.99  CALCIUM 9.3 9.3 9.3 8.8*  PROT 6.8  --  6.5  --   ALBUMIN 3.2*  --  3.3*  --   AST 23  --  21  --   ALT 16  --  17  --   ALKPHOS 52  --  45  --   BILITOT 0.9  --  0.4  --   GFRNONAA 48* >60 >60 >60  ANIONGAP 10 8 7 12      Hematology Recent Labs  Lab 11/06/21 0413 11/07/21 0552 11/08/21 0432  WBC 6.1 5.1 5.5  RBC 4.42 3.87* 3.66*  HGB 12.2* 10.7* 10.2*  HCT 37.1* 32.2* 30.4*  MCV 83.9 83.2 83.1  MCH 27.6 27.6 27.9  MCHC 32.9 33.2 33.6  RDW 16.6* 16.6* 16.5*  PLT 277 266 244    Cardiac EnzymesNo results for input(s): TROPONINI in the last 168 hours. No results for input(s): TROPIPOC in the last 168 hours.   BNPNo results for input(s): BNP, PROBNP in the last 168 hours.   DDimer No results for input(s): DDIMER in the last 168 hours.   Radiology    US RENAL  Result Date: 11/06/2021 IMPRESSION: 1. Increased echogenicity of the right kidney and perirenal fluid, which may be secondary to an infectious or inflammatory process such as acute pyelonephritis. Correlation with urinalysis is recommended. 2. Simple left renal cyst. No additional follow-up or imaging is recommended. Electronically Signed   By: Virgina Norfolk M.D.   On: 11/06/2021 16:41    Cardiac Studies   2D echo 10/25/2021: 1. Left ventricular ejection fraction, by estimation, is 20 to 25%. The  left ventricle has  severely decreased function. The left ventricle  demonstrates global hypokinesis. There is mild left ventricular  hypertrophy. Left ventricular diastolic parameters   are consistent with Grade II diastolic dysfunction (pseudonormalization).   2. Right ventricular systolic function is normal. The right ventricular  size is normal. Tricuspid regurgitation signal is inadequate for assessing  PA pressure.   3. Left atrial size was moderately dilated.   4. The mitral valve is normal in structure. Mild to moderate mitral valve  regurgitation. No evidence of mitral stenosis.   5. The aortic valve was not well visualized. Aortic valve regurgitation  is not visualized. Mild to moderate aortic valve sclerosis/calcification  is present, without any evidence of aortic stenosis.   6. There is borderline dilatation of the aortic root, measuring 37 mm.   7. The inferior vena cava is normal in size with greater than 50%  respiratory variability, suggesting right atrial pressure of 3 mmHg. __________   2D echo 07/24/2021:  1. Left ventricular ejection fraction, by estimation, is 20 to 25%. The  left ventricle has severely decreased function. The left ventricle  demonstrates global hypokinesis. There is moderate left ventricular  hypertrophy. Left ventricular diastolic  parameters are indeterminate.   2. Right ventricular systolic function is normal. The right ventricular  size is normal. Tricuspid regurgitation signal is inadequate for assessing  PA pressure.   3. Left atrial size was mildly dilated.   4. The mitral valve is degenerative. Mild mitral valve regurgitation.   5. The aortic valve is tricuspid. There is moderate calcification of the  aortic valve. There is moderate thickening of the aortic valve. Aortic  valve regurgitation is not visualized. Mild to moderate aortic valve  sclerosis/calcification is present,  without any evidence of aortic stenosis. __________   [facility] 04/13/2021: Prox  RCA lesion is 30% stenosed. Dist RCA lesion is 50% stenosed. Prox LAD to Mid LAD lesion is 20% stenosed. Ost Cx to Prox Cx lesion is 30% stenosed. 1st Mrg lesion is 40% stenosed. 2nd Diag lesion is 60% stenosed.   1.  Mild to moderate three-vessel coronary artery disease with severe calcifications.  No obstructive lesions. 2.  Left ventricular angiography was not performed.  EF was severely  reduced by echo. 3.  Right heart catheterization showed minimally elevated filling pressures, mild pulmonary hypertension and normal cardiac output.   Recommendations: The patient has nonischemic cardiomyopathy. Continue aggressive medical therapy. Volume status appears good. __________   2D echo 04/05/2021: 1. Left ventricular ejection fraction, by estimation, is 20 to 25%. The  left ventricle has severely decreased function. The left ventricle  demonstrates global hypokinesis. There is mild asymmetric left ventricular  hypertrophy. Left ventricular diastolic   parameters are consistent with Grade II diastolic dysfunction  (pseudonormalization). The average left ventricular global longitudinal  strain is -4.6 %. The global longitudinal strain is abnormal.   2. Right ventricular systolic function is moderately reduced. The right  ventricular size is normal.   3. The mitral valve is degenerative. Mild mitral valve regurgitation.   4. The aortic valve is tricuspid. Aortic valve regurgitation is not  visualized. Mild to moderate aortic valve sclerosis/calcification is  present, without any evidence of aortic stenosis.   5. The inferior vena cava is dilated in size with <50% respiratory  variability, suggesting right atrial pressure of 15 mmHg.  Patient Profile     87 y.o. male with history of nonobstructive CAD by LHC in 03/2021, HFrEF secondary to NICM, complete heart block status post PPM, lung cancer s/p radiation, chronic hypoxic respiratory failure on supplemental oxygen at 3 L at baseline,  splenic infarct on chronic OAC, AAA s/p engovascular grafting, PAD s/p multiple interventions, DM2, HTN, HLD, Bell's palsy, prostate cancer, vertigo, and OSA on CPAP who we are seeing for elevated troponin in the setting of severe diarrhea.  Assessment & Plan    1. Nonobstructive CAD with NSTEMI: -No angina -Recent LHC without obstructive disease  -Family prefers conservative management, no plans for repeat LHC at this time -Has completed 48 hours of heparin gtt -ASA, Toprol, Zetia, simvastatin and titrated dose of Imdur -Would benefit from prn SL NTG at discharge   2. NICM: -Appears euvolemic -Continue Toprol, Entresto, spironolactone and Lasix  -Daily weights   3. Complete heart block: -Status post pacemaker -Device appears to be functioning normally -Follow up with EP  4. Severe diarrhea: -Improving, though did have a loose stool this morning  -C diff and GI panel negative  -Per primary service   5. History of splenic infarct: -Back on PTA Eliquis     For questions or updates, please contact [place] Please consult [email or web address] for contact info under Cardiology/STEMI.    Signed, Christell Faith, PA-C [place] Pager: [phone] 11/08/2021, 10:36 AM

## 2021-11-09 ENCOUNTER — Telehealth: Payer: Self-pay

## 2021-11-09 LAB — GLUCOSE, CAPILLARY
Glucose-Capillary: 124 mg/dL — ABNORMAL HIGH (ref 70–99)
Glucose-Capillary: 176 mg/dL — ABNORMAL HIGH (ref 70–99)

## 2021-11-09 MED ORDER — ISOSORBIDE MONONITRATE ER 60 MG PO TB24: 60.0000 mg | ORAL_TABLET | Freq: Every day | ORAL | 0 refills | Status: DC

## 2021-11-09 MED ORDER — NITROGLYCERIN 0.4 MG SL SUBL: 0.4000 mg | SUBLINGUAL_TABLET | SUBLINGUAL | 0 refills | Status: DC | PRN

## 2021-11-09 MED ORDER — SPIRONOLACTONE 25 MG PO TABS: 12.5000 mg | ORAL_TABLET | Freq: Every day | ORAL | 0 refills | Status: DC

## 2021-11-09 NOTE — Discharge Summary (Signed)
Physician Discharge Summary  Howard [name] VQQ:[id number] DOB: [birth year] DOA: 11/05/2021  PCP: Jerrol Banana., MD  Admit date: 11/05/2021 Discharge date: 11/09/2021  Admitted From: Home Disposition:  Home   Recommendations for Outpatient Follow-up:  Follow up with PCP in 1-2 weeks Please obtain BMP/CBC in one week   Home Health:YES Discharge Condition:Stable CODE STATUS:DNR Diet recommendation: Heart Healthy / Carb Modified    Brief/Interim Summary:  Howard [name] is a 87 y.o. male with medical history significant for HFrEF 20-25%, chronic combined diastolic and systolic CHF, type 2 diabetes, GERD, hypertension, hyperlipidemia, non-small cell lung cancer s/p radiation, chronic respiratory failure on 3 L nasal cannula continuously, emphysema, complete heart block status post pacemaker placement, CAD, recently discharged from the hospital on 10/28/21 for acute on chronic respiratory failure secondary to pneumonia as well as NSTEMI, who presents to [facility] ED from home with complaints of chest pain that woke him up out of his sleep early morning of 11/05/21.  His wife reports worsening generalized weakness since his discharge, using a walker to avoid falls, and stool incontinence with multiple accidents at home.     Work-up revealed  chest pain with elevated troponin for he was started on heparin drip and cardiology consulted.  Seen by cardiology, recommended conservative management.  NSTEMI - Recently discharged from [facility] after admission for NSTEMI on 10/28/2021 - troponin peaked at 1077. -Cardiology input greatly appreciated, current recommendation for conservative management, and medication optimization, Imdur was increased to 60 mg daily, it did give significant relief with chest pain, continue with Toprol, aspirin and statin. -He was treated with heparin GTT, currently transitioned to Eliquis.   - Last 2D echo done on 10/25/2021 showed LVEF 20 to 25% with left ventricle global  hypokinesis, grade 2 diastolic dysfunction. - Continue to monitor on telemetry. -Will benefit from sublingual nitro on discharge -Patient did have some low blood pressure, dizziness, but this has improved after decreasing his Aldactone, will continue with higher dose Imdur given the relief and gave regarding chest pain, and will continue with half dose Aldactone.   Loose stools with incontinence, unclear etiology Patient is on p.o. azithromycin 3 times a week, he did have some intermittent diarrhea in the past but not that extensive, this all has worsened over the last week after he was started on azithromycin, this is the most likely contributing factor to his current severe diarrhea, it did improve currently after putting him on Imodium as needed . -Azithromycin on discharge . -Resume home regimen on discharge no evidence of infectious etiology, negative GI panel and C. difficile, it has been improving after he was started on Imodium   Chronic combined diastolic and systolic CHF Euvolemic on exam. Last 2D echo done on 10/25/2021 showed LVEF 20 to 25% with left ventricle global hypokinesis, grade 2 diastolic dysfunction. Start strict I's and O's and daily weight Management per cardiology. -Dizziness with some lightheadedness and soft blood pressure after increasing Imdur, this has all improved after decreasing his Aldactone.   Type 2 diabetes with hyperglycemia Hemoglobin A1c 8.2 on 10/24/2021 Continue insulin sliding scale Avoid hypoglycemia   Chronic hypoxic respiratory failure secondary to emphysema/small cell lung cancer On 3 L oxygen by nasal cannula continuously at baseline. Maintain O2 saturation greater than 92%. Continue home regimen, bronchodilators. Mobilize as tolerated.   Moderate protein calorie malnutrition BMI 26 Continue Megace for appetite stimulant Encourage increase in oral protein calorie intake.   Non-small cell lung cancer status post radiation Continue home  regimen/bronchodilators   Physical debility/generalized weakness PT OT arranged with home health.      Discharge Diagnoses:  Principal Problem:   NSTEMI (non-ST elevated myocardial infarction) [facility]) Active Problems:   CAD (coronary artery disease)   Complete atrioventricular block (HCC)   Malignant neoplasm of right lung (HCC)   Chronic systolic congestive heart failure ([place])   CKD stage 3 due to type 2 diabetes mellitus [facility])    Discharge Instructions  Discharge Instructions     Diet - low sodium heart healthy   Complete by: As directed    Increase activity slowly   Complete by: As directed       Allergies as of 11/09/2021       Reactions   Sulfa Antibiotics Rash        Medication List     STOP taking these medications    azithromycin 250 MG tablet Commonly known as: ZITHROMAX   predniSONE 5 MG tablet Commonly known as: DELTASONE       TAKE these medications    acetaminophen 500 MG tablet Commonly known as: TYLENOL Take 1,000 mg by mouth every 6 (six) hours as needed for moderate pain.   albuterol 108 (90 Base) MCG/ACT inhaler Commonly known as: VENTOLIN HFA INHALE 2 PUFFS INTO LUNGS EVERY 6 HOURS AS NEEDED FOR WHEEZING OR SHORTNESS OF BREATH   aspirin EC 81 MG tablet Take 81 mg by mouth daily.   cetirizine 10 MG tablet Commonly known as: ZYRTEC Take 10 mg by mouth daily.   donepezil 5 MG tablet Commonly known as: ARICEPT Take 1 tablet (5 mg total) by mouth daily.   Eliquis 5 MG Tabs tablet Generic drug: apixaban TAKE 1 TABLET TWICE A DAY  (SWITCHED FROM PLAVIX) What changed: See the new instructions.   Entresto 49-51 MG Generic drug: sacubitril-valsartan Take 1 tablet by mouth 2 (two) times daily.   ezetimibe 10 MG tablet Commonly known as: ZETIA TAKE 1 TABLET DAILY   furosemide 20 MG tablet Commonly known as: LASIX TAKE 1 TABLET BY MOUTH ONCE DAILY   isosorbide mononitrate 60 MG 24 hr tablet Commonly known as: IMDUR Take 1  tablet (60 mg total) by mouth daily. Start taking on: November 10, 2021 What changed:  medication strength how much to take   megestrol 40 MG tablet Commonly known as: MEGACE Take 1 tablet (40 mg total) by mouth 2 (two) times daily.   metFORMIN 500 MG tablet Commonly known as: GLUCOPHAGE Take 1 tablet (500 mg total) by mouth 2 (two) times daily with a meal.   metoprolol succinate 25 MG 24 hr tablet Commonly known as: TOPROL-XL TAKE 1 TABLET AT BEDTIME   montelukast 10 MG tablet Commonly known as: SINGULAIR Take 10 mg by mouth daily as needed (sneezing/allergies.).   multivitamin with minerals Tabs tablet Take 1 tablet by mouth daily. One-A-Day Multivitamin   nitroGLYCERIN 0.4 MG SL tablet Commonly known as: NITROSTAT Place 1 tablet (0.4 mg total) under the tongue every 5 (five) minutes as needed for chest pain. Do not exceed 2 tablets/day.   omeprazole 40 MG capsule Commonly known as: PRILOSEC TAKE 1 CAPSULE TWICE DAILY AS NEEDED What changed: Another medication with the same name was removed. Continue taking this medication, and follow the directions you see here.   sertraline 50 MG tablet Commonly known as: ZOLOFT Take 1 tablet (50 mg total) by mouth daily.   simvastatin 40 MG tablet Commonly known as: ZOCOR TAKE 1 TABLET AT BEDTIME   SODIUM BICARBONATE  PO Take 1 tablet by mouth 3 (three) times daily.   spironolactone 25 MG tablet Commonly known as: ALDACTONE Take 0.5 tablets (12.5 mg total) by mouth daily. What changed: how much to take   Trelegy Ellipta 100-62.5-25 MCG/ACT Aepb Generic drug: Fluticasone-Umeclidin-Vilant Inhale 1 puff into the lungs daily.   Vitamin D3 25 MCG (1000 UT) Caps Take 1,000 Units by mouth daily.   Vitron-C 65-125 MG Tabs Generic drug: Iron-Vitamin C Take 1 tablet by mouth daily.        Follow-up Information     Jerrol Banana., MD. Go on 12/05/2021.   Specialty: Family Medicine Why: @11am  You will see Dr.  Caryn Section. Contact information: [address] [place] [address] [phone]         [name], MD. Go on 12/03/2021.   Specialty: Cardiology Why: @ 2:40pm Contact information: [place] [address] [phone]                Allergies  Allergen Reactions   Sulfa Antibiotics Rash    Consultations: cardiology   Procedures/Studies: CT Angio Chest PE W and/or Wo Contrast  Result Date: 10/24/2021 CLINICAL DATA:  Acute onset shortness of breath. History of lung cancer. EXAM: CT ANGIOGRAPHY CHEST WITH CONTRAST TECHNIQUE: Multidetector CT imaging of the chest was performed using the standard protocol during bolus administration of intravenous contrast. Multiplanar CT image reconstructions and MIPs were obtained to evaluate the vascular anatomy. CONTRAST:  25mL OMNIPAQUE IOHEXOL 350 MG/ML SOLN COMPARISON:  CT chest dated September 12, 2021. FINDINGS: Cardiovascular: Satisfactory opacification of the pulmonary arteries to the segmental level. No evidence of pulmonary embolism. Unchanged mild cardiomegaly. No pericardial effusion. No thoracic aortic aneurysm. Coronary, aortic arch, and branch vessel atherosclerotic vascular disease. Mediastinum/Nodes: No enlarged mediastinal, hilar, or axillary lymph nodes. Unchanged index subcarinal lymph node measuring 1.1 cm in short axis. Thyroid gland, trachea, and esophagus demonstrate no significant findings. Lungs/Pleura: New patchy ground-glass densities with areas of nodular consolidation in the right upper, right middle, and right lower lobes, with inter- and intralobular septal thickening. Post treatment related dense fibrotic consolidation bronchiectasis in the medial right upper and lower lobes has progressed. New small right and trace left pleural effusions. No pneumothorax. Unchanged calcified scarring in the lingula and medial right middle lobe. Mild dependent subsegmental atelectasis in  the posterior left lower lobe. Mild emphysema. New 9 x 8 mm spiculated density in the right upper lobe (series 6, image 22). Upper Abdomen: No acute abnormality. Musculoskeletal: No chest wall abnormality. No acute or significant osseous findings. Review of the MIP images confirms the above findings. IMPRESSION: 1. No evidence of pulmonary embolism. 2. New patchy ground-glass densities with areas of nodular consolidation in the right upper, right middle, and right lower lobes, with inter- and intralobular septal thickening. Differential considerations include multifocal pneumonia, asymmetric pulmonary edema, and pulmonary hemorrhage. 3. New small right and trace left pleural effusions. 4. New 9 x 8 mm spiculated density in the right upper lobe, presumably related to underlying acute process in the right lung. However, attention on follow-up imaging is recommended. 5. Aortic Atherosclerosis (ICD10-I70.0) and Emphysema (ICD10-J43.9). Electronically Signed   By: Titus Dubin M.D.   On: 10/24/2021 11:14   US RENAL  Result Date: 11/06/2021 CLINICAL DATA:  An area. EXAM: RENAL / URINARY TRACT ULTRASOUND COMPLETE COMPARISON:  February 23, 2014 FINDINGS: Right Kidney: Renal measurements: 11.7 cm x 4.1 cm x 4.2 cm = volume: 106  mL. Diffusely increased echogenicity of the renal parenchyma is noted. No mass or hydronephrosis visualized. Left Kidney: Renal measurements: 12.1 cm x 6.1 cm x 5.5 cm = volume: 212 mL. Echogenicity within normal limits. A 2.7 cm x 1.5 cm x 2.2 cm well-defined, anechoic structure is seen within the lower pole of the left kidney. No abnormal flow is seen within this region on color Doppler evaluation. No hydronephrosis is visualized. Bladder: Appears normal for degree of bladder distention. Other: A mild amount of right-sided perirenal fluid is noted. IMPRESSION: 1. Increased echogenicity of the right kidney and perirenal fluid, which may be secondary to an infectious or inflammatory process such  as acute pyelonephritis. Correlation with urinalysis is recommended. 2. Simple left renal cyst. No additional follow-up or imaging is recommended. Electronically Signed   By: Virgina Norfolk M.D.   On: 11/06/2021 16:41   DG Chest Portable 1 View  Result Date: 11/05/2021 CLINICAL DATA:  Dyspnea EXAM: PORTABLE CHEST 1 VIEW COMPARISON:  10/28/2021 FINDINGS: Lung volumes are small. Developing left basilar atelectasis or infiltrate. Stable subtle infiltrate within the right mid lung zone. No pneumothorax or pleural effusion. Cardiac size is within normal limits. Left subclavian dual lead pacemaker is unchanged. Pulmonary vascularity is normal. No acute bone abnormality. IMPRESSION: Pulmonary hypoinflation. Developing left basilar atelectasis or infiltrate. Stable right mid lung zone subtle pulmonary infiltrate. Electronically Signed   By: Fidela Salisbury M.D.   On: 11/05/2021 03:34   DG Chest Port 1 View  Result Date: 10/28/2021 CLINICAL DATA:  Acute shortness of breath. Hypoxemia. Lung carcinoma. EXAM: PORTABLE CHEST 1 VIEW COMPARISON:  10/25/2021 FINDINGS: The heart size and mediastinal contours are within normal limits. Dual lead transvenous pacemaker remains in appropriate position. Aortic atherosclerotic calcification noted. Low lung volumes are again seen. Infiltrate or atelectasis is again seen in the inferior right upper lobe, abutting the minor fissure, without significant change. No new or increased areas of pulmonary opacity are seen. No evidence of pleural effusion. IMPRESSION: No significant change in right upper lobe infiltrate or atelectasis. Electronically Signed   By: Marlaine Hind M.D.   On: 10/28/2021 09:45   DG Chest Port 1 View  Result Date: 10/25/2021 CLINICAL DATA:  87 year old male with history of shortness of breath and lung cancer with recent abnormal imaging. EXAM: PORTABLE CHEST 1 VIEW COMPARISON:  Chest CT of October 24, 2021 and chest radiograph of October 24, 2021. FINDINGS:  LEFT-sided dual lead pacer device, leads project over the cardiac silhouette and power pack over the LEFT chest as before. EKG leads project over the chest. Cardiomediastinal contours are stable. Increased interstitial and alveolar opacities seen more on the RIGHT than the LEFT have diminished in the short interval. Bandlike area of airspace disease in the RIGHT mid chest. No pneumothorax. On limited assessment there is no acute skeletal process. IMPRESSION: Increased interstitial and alveolar opacities seen on the RIGHT than the LEFT have diminished in the short interval. Likely reflecting improving edema and or volume overload. Bandlike area of airspace disease in the RIGHT mid chest likely atelectatic changes. Persistent basilar opacities in the RIGHT medial lower chest related to volume loss and post treatment changes, superimposed pneumonia would be difficult to exclude. Electronically Signed   By: Zetta Bills M.D.   On: 10/25/2021 11:32   DG Chest Portable 1 View  Result Date: 10/24/2021 CLINICAL DATA:  Shortness of breath. EXAM: PORTABLE CHEST 1 VIEW COMPARISON:  CT chest dated September 12, 2021. Chest x-ray dated July 03, 2021.  FINDINGS: Left chest wall pacemaker again noted. Unchanged mild cardiomegaly. New hazy airspace disease in the right mid and lower lung. Unchanged scarring in the left lung base and post treatment changes in the medial right lower lobe. No pleural effusion or pneumothorax. No acute osseous abnormality. IMPRESSION: 1. New hazy airspace disease in the right mid and lower lung concerning for pneumonia. Electronically Signed   By: Titus Dubin M.D.   On: 10/24/2021 08:54   ECHOCARDIOGRAM COMPLETE  Result Date: 10/25/2021    ECHOCARDIOGRAM REPORT   Patient Name:   ORREN PIETSCH Date of Exam: 10/25/2021 Medical Rec #:  [id number]      Height:       69.0 in Accession #:    [id number]     Weight:       185.2 lb Date of Birth:  01-Nov-1931       BSA:          2.000 m Patient Age:     37 years       BP:           123/69 mmHg Patient Gender: M              HR:           106 bpm. Exam Location:  [facility] Procedure: 2D Echo, Color Doppler and Cardiac Doppler Indications:     Elevated troponin  History:         Patient has prior history of Echocardiogram examinations, most                  recent 07/24/2021. CHF, CAD, Pacemaker; Risk Factors:Diabetes,                  Hypertension and Dyslipidemia. Hx of radiation therapy.  Sonographer:     Charmayne Sheer Referring Phys:  [id number] AMIN Diagnosing Phys: [name] MD IMPRESSIONS  1. Left ventricular ejection fraction, by estimation, is 20 to 25%. The left ventricle has severely decreased function. The left ventricle demonstrates global hypokinesis. There is mild left ventricular hypertrophy. Left ventricular diastolic parameters  are consistent with Grade II diastolic dysfunction (pseudonormalization).  2. Right ventricular systolic function is normal. The right ventricular size is normal. Tricuspid regurgitation signal is inadequate for assessing PA pressure.  3. Left atrial size was moderately dilated.  4. The mitral valve is normal in structure. Mild to moderate mitral valve regurgitation. No evidence of mitral stenosis.  5. The aortic valve was not well visualized. Aortic valve regurgitation is not visualized. Mild to moderate aortic valve sclerosis/calcification is present, without any evidence of aortic stenosis.  6. There is borderline dilatation of the aortic root, measuring 37 mm.  7. The inferior vena cava is normal in size with greater than 50% respiratory variability, suggesting right atrial pressure of 3 mmHg. FINDINGS  Left Ventricle: Left ventricular ejection fraction, by estimation, is 20 to 25%. The left ventricle has severely decreased function. The left ventricle demonstrates global hypokinesis. The left ventricular internal cavity size was normal in size. There is mild left ventricular hypertrophy. Left ventricular diastolic  parameters are consistent with Grade II diastolic dysfunction (pseudonormalization). Right Ventricle: The right ventricular size is normal. No increase in right ventricular wall thickness. Right ventricular systolic function is normal. Tricuspid regurgitation signal is inadequate for assessing PA pressure. Left Atrium: Left atrial size was moderately dilated. Right Atrium: Right atrial size was normal in size. Pericardium: There is no evidence of pericardial effusion. Mitral Valve: The mitral  valve is normal in structure. There is mild thickening of the mitral valve leaflet(s). Mild to moderate mitral valve regurgitation. No evidence of mitral valve stenosis. MV peak gradient, 11.8 mmHg. The mean mitral valve gradient  is 4.0 mmHg. Tricuspid Valve: The tricuspid valve is normal in structure. Tricuspid valve regurgitation is not demonstrated. No evidence of tricuspid stenosis. Aortic Valve: The aortic valve was not well visualized. Aortic valve regurgitation is not visualized. Mild to moderate aortic valve sclerosis/calcification is present, without any evidence of aortic stenosis. Aortic valve mean gradient measures 2.0 mmHg.  Aortic valve peak gradient measures 4.8 mmHg. Aortic valve area, by VTI measures 2.19 cm. Pulmonic Valve: The pulmonic valve was normal in structure. Pulmonic valve regurgitation is not visualized. No evidence of pulmonic stenosis. Aorta: The aortic root is normal in size and structure. There is borderline dilatation of the aortic root, measuring 37 mm. Venous: The inferior vena cava is normal in size with greater than 50% respiratory variability, suggesting right atrial pressure of 3 mmHg. IAS/Shunts: No atrial level shunt detected by color flow Doppler. Additional Comments: A device lead is visualized.  LEFT VENTRICLE PLAX 2D LVIDd:         4.70 cm   Diastology LVIDs:         3.46 cm   LV e' medial:    7.29 cm/s LV PW:         1.09 cm   LV E/e' medial:  20.3 LV IVS:        0.89 cm   LV e'  lateral:   12.40 cm/s LVOT diam:     2.40 cm   LV E/e' lateral: 11.9 LV SV:         32 LV SV Index:   16 LVOT Area:     4.52 cm  LEFT ATRIUM             Index LA diam:        5.50 cm 2.75 cm/m LA Vol (A2C):   66.1 ml 33.06 ml/m LA Vol (A4C):   41.0 ml 20.50 ml/m LA Biplane Vol: 52.1 ml 26.06 ml/m  AORTIC VALVE                    PULMONIC VALVE AV Area (Vmax):    2.14 cm     PV Vmax:       0.67 m/s AV Area (Vmean):   2.22 cm     PV Vmean:      49.300 cm/s AV Area (VTI):     2.19 cm     PV VTI:        0.094 m AV Vmax:           110.00 cm/s  PV Peak grad:  1.8 mmHg AV Vmean:          65.100 cm/s  PV Mean grad:  1.0 mmHg AV VTI:            0.144 m AV Peak Grad:      4.8 mmHg AV Mean Grad:      2.0 mmHg LVOT Vmax:         52.00 cm/s LVOT Vmean:        31.900 cm/s LVOT VTI:          0.070 m LVOT/AV VTI ratio: 0.48  AORTA Ao Root diam: 3.70 cm MITRAL VALVE MV Area (PHT): 5.58 cm     SHUNTS MV Area VTI:   1.06 cm  Systemic VTI:  0.07 m MV Peak grad:  11.8 mmHg    Systemic Diam: 2.40 cm MV Mean grad:  4.0 mmHg MV Vmax:       1.72 m/s MV Vmean:      89.6 cm/s MV Decel Time: 136 msec MV E velocity: 148.00 cm/s MV A velocity: 51.40 cm/s MV E/A ratio:  2.88 [name] MD Electronically signed by [name] MD Signature Date/Time: 10/25/2021/5:46:45 PM    Final       Subjective: Patient had a good night sleep, no loose bowel movements overnight  Discharge Exam: Vitals:   11/09/21 0826 11/09/21 1120  BP: 135/68 114/68  Pulse: 96 76  Resp: 18 18  Temp: 97.8 F (36.6 C) 98 F (36.7 C)  SpO2: 94% 92%   Vitals:   11/09/21 0538 11/09/21 0810 11/09/21 0826 11/09/21 1120  BP:   135/68 114/68  Pulse:   96 76  Resp:  14 18 18   Temp:   97.8 F (36.6 C) 98 F (36.7 C)  TempSrc:      SpO2:   94% 92%  Weight: 81.3 kg     Height:        General: Pt is alert, awake, not in acute distress, frail Cardiovascular: RRR, S1/S2 +, no rubs, no gallops Respiratory: CTA bilaterally, no wheezing, no  rhonchi Abdominal: Soft, NT, ND, bowel sounds + Extremities: no edema, no cyanosis    The results of significant diagnostics from this hospitalization (including imaging, microbiology, ancillary and laboratory) are listed below for reference.     Microbiology: Recent Results (from the past 240 hour(s))  Resp Panel by RT-PCR (Flu A&B, Covid) Nasopharyngeal Swab     Status: None   Collection Time: 11/05/21  3:15 AM   Specimen: Nasopharyngeal Swab; Nasopharyngeal(NP) swabs in vial transport medium  Result Value Ref Range Status   SARS Coronavirus 2 by RT PCR NEGATIVE NEGATIVE Final    Comment: (NOTE) SARS-CoV-2 target nucleic acids are NOT DETECTED.  The SARS-CoV-2 RNA is generally detectable in upper respiratory specimens during the acute phase of infection. The lowest concentration of SARS-CoV-2 viral copies this assay can detect is 138 copies/mL. A negative result does not preclude SARS-Cov-2 infection and should not be used as the sole basis for treatment or other patient management decisions. A negative result may occur with  improper specimen collection/handling, submission of specimen other than nasopharyngeal swab, presence of viral mutation(s) within the areas targeted by this assay, and inadequate number of viral copies(<138 copies/mL). A negative result must be combined with clinical observations, patient history, and epidemiological information. The expected result is Negative.  Fact Sheet for Patients:  EntrepreneurPulse.com.au  Fact Sheet for Healthcare Providers:  IncredibleEmployment.be  This test is no t yet approved or cleared by the [place] FDA and  has been authorized for detection and/or diagnosis of SARS-CoV-2 by FDA under an Emergency Use Authorization (EUA). This EUA will remain  in effect (meaning this test can be used) for the duration of the COVID-19 declaration under Section 564(b)(1) of the Act,  21 U.S.C.section 360bbb-3(b)(1), unless the authorization is terminated  or revoked sooner.       Influenza A by PCR NEGATIVE NEGATIVE Final   Influenza B by PCR NEGATIVE NEGATIVE Final    Comment: (NOTE) The Xpert Xpress SARS-CoV-2/FLU/RSV plus assay is intended as an aid in the diagnosis of influenza from Nasopharyngeal swab specimens and should not be used as a sole basis for treatment. Nasal washings and aspirates  are unacceptable for Xpert Xpress SARS-CoV-2/FLU/RSV testing.  Fact Sheet for Patients: EntrepreneurPulse.com.au  Fact Sheet for Healthcare Providers: IncredibleEmployment.be  This test is not yet approved or cleared by the [place] FDA and has been authorized for detection and/or diagnosis of SARS-CoV-2 by FDA under an Emergency Use Authorization (EUA). This EUA will remain in effect (meaning this test can be used) for the duration of the COVID-19 declaration under Section 564(b)(1) of the Act, 21 U.S.C. section 360bbb-3(b)(1), unless the authorization is terminated or revoked.  Performed at [facility], [address] [place], [place] [address]   Gastrointestinal Panel by PCR , Stool     Status: None   Collection Time: 11/07/21  4:02 AM   Specimen: Stool  Result Value Ref Range Status   Campylobacter species NOT DETECTED NOT DETECTED Final   Plesimonas shigelloides NOT DETECTED NOT DETECTED Final   Salmonella species NOT DETECTED NOT DETECTED Final   Yersinia enterocolitica NOT DETECTED NOT DETECTED Final   Vibrio species NOT DETECTED NOT DETECTED Final   Vibrio cholerae NOT DETECTED NOT DETECTED Final   Enteroaggregative E coli (EAEC) NOT DETECTED NOT DETECTED Final   Enteropathogenic E coli (EPEC) NOT DETECTED NOT DETECTED Final   Enterotoxigenic E coli (ETEC) NOT DETECTED NOT DETECTED Final   Shiga like toxin producing E coli (STEC) NOT DETECTED NOT DETECTED Final   Shigella/Enteroinvasive E coli  (EIEC) NOT DETECTED NOT DETECTED Final   Cryptosporidium NOT DETECTED NOT DETECTED Final   Cyclospora cayetanensis NOT DETECTED NOT DETECTED Final   Entamoeba histolytica NOT DETECTED NOT DETECTED Final   Giardia lamblia NOT DETECTED NOT DETECTED Final   Adenovirus F40/41 NOT DETECTED NOT DETECTED Final   Astrovirus NOT DETECTED NOT DETECTED Final   Norovirus GI/GII NOT DETECTED NOT DETECTED Final   Rotavirus A NOT DETECTED NOT DETECTED Final   Sapovirus (I, II, IV, and V) NOT DETECTED NOT DETECTED Final    Comment: Performed at [facility], [address] [place], [place] [address]  C Difficile Quick Screen w PCR reflex     Status: None   Collection Time: 11/07/21  4:02 AM   Specimen: Stool  Result Value Ref Range Status   C Diff antigen NEGATIVE NEGATIVE Final   C Diff toxin NEGATIVE NEGATIVE Final   C Diff interpretation No C. difficile detected.  Final    Comment: Performed at [facility], [address] [place], [place] [address]     Labs: BNP (last 3 results) Recent Labs    07/03/21 1335 10/24/21 0827  BNP 771.5* 2,094.7*   Basic Metabolic Panel: Recent Labs  Lab 11/05/21 0315 11/06/21 0413 11/07/21 0552 11/08/21 0432  NA 134* 138 139 138  K 5.1 4.2 4.4 3.7  CL 108 110 111 107  CO2 16* 20* 21* 19*  GLUCOSE 245* 153* 146* 156*  BUN 32* 26* 24* 25*  CREATININE 1.42* 1.00 1.10 0.99  CALCIUM 9.3 9.3 9.3 8.8*  MG  --   --  1.9  --   PHOS  --   --  3.5  --    Liver Function Tests: Recent Labs  Lab 11/05/21 0315 11/07/21 0552  AST 23 21  ALT 16 17  ALKPHOS 52 45  BILITOT 0.9 0.4  PROT 6.8 6.5  ALBUMIN 3.2* 3.3*   No results for input(s): LIPASE, AMYLASE in the last 168 hours. No results for input(s): AMMONIA in the last 168 hours. CBC: Recent Labs  Lab 11/05/21 0315 11/06/21 0413  11/07/21 0552 11/08/21 0432  WBC 5.4 6.1 5.1 5.5  NEUTROABS 4.0  --   --   --   HGB 10.9* 12.2* 10.7* 10.2*  HCT 33.5* 37.1* 32.2* 30.4*  MCV  85.0 83.9 83.2 83.1  PLT 269 277 266 244   Cardiac Enzymes: No results for input(s): CKTOTAL, CKMB, CKMBINDEX, TROPONINI in the last 168 hours. BNP: Invalid input(s): POCBNP CBG: Recent Labs  Lab 11/08/21 1146 11/08/21 1646 11/08/21 2019 11/09/21 0807 11/09/21 1119  GLUCAP 179* 159* 181* 124* 176*   D-Dimer No results for input(s): DDIMER in the last 72 hours. Hgb A1c No results for input(s): HGBA1C in the last 72 hours. Lipid Profile No results for input(s): CHOL, HDL, LDLCALC, TRIG, CHOLHDL, LDLDIRECT in the last 72 hours. Thyroid function studies No results for input(s): TSH, T4TOTAL, T3FREE, THYROIDAB in the last 72 hours.  Invalid input(s): FREET3 Anemia work up No results for input(s): VITAMINB12, FOLATE, FERRITIN, TIBC, IRON, RETICCTPCT in the last 72 hours. Urinalysis    Component Value Date/Time   COLORURINE YELLOW (A) 11/08/2021 0724   APPEARANCEUR HAZY (A) 11/08/2021 0724   APPEARANCEUR Clear 05/07/2016 1026   LABSPEC 1.025 11/08/2021 0724   LABSPEC 1.015 11/11/2014 0450   PHURINE 5.0 11/08/2021 0724   GLUCOSEU NEGATIVE 11/08/2021 0724   GLUCOSEU Negative 11/11/2014 0450   HGBUR NEGATIVE 11/08/2021 0724   BILIRUBINUR NEGATIVE 11/08/2021 0724   BILIRUBINUR Negative 05/07/2016 1026   BILIRUBINUR Negative 11/11/2014 0450   KETONESUR 5 (A) 11/08/2021 0724   PROTEINUR 30 (A) 11/08/2021 0724   UROBILINOGEN 0.2 04/18/2016 1057   UROBILINOGEN 0.2 06/15/2010 0942   NITRITE NEGATIVE 11/08/2021 0724   LEUKOCYTESUR NEGATIVE 11/08/2021 0724   LEUKOCYTESUR Negative 11/11/2014 0450   Sepsis Labs Invalid input(s): PROCALCITONIN,  WBC,  LACTICIDVEN Microbiology Recent Results (from the past 240 hour(s))  Resp Panel by RT-PCR (Flu A&B, Covid) Nasopharyngeal Swab     Status: None   Collection Time: 11/05/21  3:15 AM   Specimen: Nasopharyngeal Swab; Nasopharyngeal(NP) swabs in vial transport medium  Result Value Ref Range Status   SARS Coronavirus 2 by RT PCR  NEGATIVE NEGATIVE Final    Comment: (NOTE) SARS-CoV-2 target nucleic acids are NOT DETECTED.  The SARS-CoV-2 RNA is generally detectable in upper respiratory specimens during the acute phase of infection. The lowest concentration of SARS-CoV-2 viral copies this assay can detect is 138 copies/mL. A negative result does not preclude SARS-Cov-2 infection and should not be used as the sole basis for treatment or other patient management decisions. A negative result may occur with  improper specimen collection/handling, submission of specimen other than nasopharyngeal swab, presence of viral mutation(s) within the areas targeted by this assay, and inadequate number of viral copies(<138 copies/mL). A negative result must be combined with clinical observations, patient history, and epidemiological information. The expected result is Negative.  Fact Sheet for Patients:  EntrepreneurPulse.com.au  Fact Sheet for Healthcare Providers:  IncredibleEmployment.be  This test is no t yet approved or cleared by the [place] FDA and  has been authorized for detection and/or diagnosis of SARS-CoV-2 by FDA under an Emergency Use Authorization (EUA). This EUA will remain  in effect (meaning this test can be used) for the duration of the COVID-19 declaration under Section 564(b)(1) of the Act, 21 U.S.C.section 360bbb-3(b)(1), unless the authorization is terminated  or revoked sooner.       Influenza A by PCR NEGATIVE NEGATIVE Final   Influenza B by PCR NEGATIVE NEGATIVE Final  Comment: (NOTE) The Xpert Xpress SARS-CoV-2/FLU/RSV plus assay is intended as an aid in the diagnosis of influenza from Nasopharyngeal swab specimens and should not be used as a sole basis for treatment. Nasal washings and aspirates are unacceptable for Xpert Xpress SARS-CoV-2/FLU/RSV testing.  Fact Sheet for Patients: EntrepreneurPulse.com.au  Fact Sheet for  Healthcare Providers: IncredibleEmployment.be  This test is not yet approved or cleared by the [place] FDA and has been authorized for detection and/or diagnosis of SARS-CoV-2 by FDA under an Emergency Use Authorization (EUA). This EUA will remain in effect (meaning this test can be used) for the duration of the COVID-19 declaration under Section 564(b)(1) of the Act, 21 U.S.C. section 360bbb-3(b)(1), unless the authorization is terminated or revoked.  Performed at [facility], [address] [place], [place] [address]   Gastrointestinal Panel by PCR , Stool     Status: None   Collection Time: 11/07/21  4:02 AM   Specimen: Stool  Result Value Ref Range Status   Campylobacter species NOT DETECTED NOT DETECTED Final   Plesimonas shigelloides NOT DETECTED NOT DETECTED Final   Salmonella species NOT DETECTED NOT DETECTED Final   Yersinia enterocolitica NOT DETECTED NOT DETECTED Final   Vibrio species NOT DETECTED NOT DETECTED Final   Vibrio cholerae NOT DETECTED NOT DETECTED Final   Enteroaggregative E coli (EAEC) NOT DETECTED NOT DETECTED Final   Enteropathogenic E coli (EPEC) NOT DETECTED NOT DETECTED Final   Enterotoxigenic E coli (ETEC) NOT DETECTED NOT DETECTED Final   Shiga like toxin producing E coli (STEC) NOT DETECTED NOT DETECTED Final   Shigella/Enteroinvasive E coli (EIEC) NOT DETECTED NOT DETECTED Final   Cryptosporidium NOT DETECTED NOT DETECTED Final   Cyclospora cayetanensis NOT DETECTED NOT DETECTED Final   Entamoeba histolytica NOT DETECTED NOT DETECTED Final   Giardia lamblia NOT DETECTED NOT DETECTED Final   Adenovirus F40/41 NOT DETECTED NOT DETECTED Final   Astrovirus NOT DETECTED NOT DETECTED Final   Norovirus GI/GII NOT DETECTED NOT DETECTED Final   Rotavirus A NOT DETECTED NOT DETECTED Final   Sapovirus (I, II, IV, and V) NOT DETECTED NOT DETECTED Final    Comment: Performed at [facility], [address]  [place], [place] [address]  C Difficile Quick Screen w PCR reflex     Status: None   Collection Time: 11/07/21  4:02 AM   Specimen: Stool  Result Value Ref Range Status   C Diff antigen NEGATIVE NEGATIVE Final   C Diff toxin NEGATIVE NEGATIVE Final   C Diff interpretation No C. difficile detected.  Final    Comment: Performed at [facility], [address] [place], [place] [address]     Time coordinating discharge: Over 30 minutes  SIGNED:   Phillips Climes, MD  [facility] 11/09/2021, 12:02 PM Pager   If 7PM-7AM, please contact night-coverage [email or web address] Password [email or web address]

## 2021-11-09 NOTE — Discharge Instructions (Signed)
Follow with Primary MD Jerrol Banana., MD in 7 days   Get CBC, CMP,  checked  by Primary MD next visit.    Activity: As tolerated with Full fall precautions use walker/cane & assistance as needed  Disposition Home    Diet: Heart Healthy , with feeding assistance and aspiration precautions.  For Heart failure patients - Check your Weight same time everyday, if you gain over 2 pounds, or you develop in leg swelling, experience more shortness of breath or chest pain, call your Primary MD immediately. Follow Cardiac Low Salt Diet and 1.5 lit/day fluid restriction.   On your next visit with your primary care physician please Get Medicines reviewed and adjusted.   Please request your Prim.MD to go over all Hospital Tests and Procedure/Radiological results at the follow up, please get all Hospital records sent to your Prim MD by signing hospital release before you go home.   If you experience worsening of your admission symptoms, develop shortness of breath, life threatening emergency, suicidal or homicidal thoughts you must seek medical attention immediately by calling 911 or calling your MD immediately  if symptoms less severe.  You Must read complete instructions/literature along with all the possible adverse reactions/side effects for all the Medicines you take and that have been prescribed to you. Take any new Medicines after you have completely understood and accpet all the possible adverse reactions/side effects.   Do not drive, operating heavy machinery, perform activities at heights, swimming or participation in water activities or provide baby sitting services if your were admitted for syncope or siezures until you have seen by Primary MD or a Neurologist and advised to do so again.  Do not drive when taking Pain medications.    Do not take more than prescribed Pain, Sleep and Anxiety Medications  Special Instructions: If you have smoked or chewed Tobacco  in the last 2 yrs  please stop smoking, stop any regular Alcohol  and or any Recreational drug use.  Wear Seat belts while driving.   Please note  You were cared for by a hospitalist during your hospital stay. If you have any questions about your discharge medications or the care you received while you were in the hospital after you are discharged, you can call the unit and asked to speak with the hospitalist on call if the hospitalist that took care of you is not available. Once you are discharged, your primary care physician will handle any further medical issues. Please note that NO REFILLS for any discharge medications will be authorized once you are discharged, as it is imperative that you return to your primary care physician (or establish a relationship with a primary care physician if you do not have one) for your aftercare needs so that they can reassess your need for medications and monitor your lab values.

## 2021-11-09 NOTE — Progress Notes (Signed)
Progress Note  Patient Name: Howard [name] Date of Encounter: 11/09/2021  Primary Cardiologist: [name]  Subjective   No chest pain or dyspnea. Did not sleep well last night. Last stool was "normal."   Inpatient Medications    Scheduled Meds:  acidophilus  1 capsule Oral Daily   apixaban  5 mg Oral BID   ferrous sulfate  325 mg Oral Q breakfast   And   vitamin C  250 mg Oral Q breakfast   aspirin EC  81 mg Oral Daily   azithromycin  250 mg Oral Once per day on Mon Wed Fri   cholecalciferol  1,000 Units Oral Daily   donepezil  5 mg Oral Daily   ezetimibe  10 mg Oral Daily   fluticasone furoate-vilanterol  1 puff Inhalation Daily   And   umeclidinium bromide  1 puff Inhalation Daily   furosemide  20 mg Oral Daily   insulin aspart  0-15 Units Subcutaneous TID WC   isosorbide mononitrate  60 mg Oral Daily   loratadine  10 mg Oral Daily   mouth rinse  15 mL Mouth Rinse BID   megestrol  40 mg Oral BID   metoprolol succinate  25 mg Oral QHS   multivitamin with minerals  1 tablet Oral Daily   pantoprazole  40 mg Oral QHS   sacubitril-valsartan  1 tablet Oral BID   sertraline  50 mg Oral QHS   simvastatin  40 mg Oral QHS   sodium bicarbonate  650 mg Oral TID   spironolactone  12.5 mg Oral Daily   Continuous Infusions:  PRN Meds: acetaminophen, albuterol, loperamide, montelukast, nitroGLYCERIN, ondansetron (ZOFRAN) IV   Vital Signs    Vitals:   11/09/21 0425 11/09/21 0538 11/09/21 0810 11/09/21 0826  BP: 136/72   135/68  Pulse: 81   96  Resp: 18  14 18   Temp: 98 F (36.7 C)   97.8 F (36.6 C)  TempSrc:      SpO2: 95%   94%  Weight:  81.3 kg    Height:        Intake/Output Summary (Last 24 hours) at 11/09/2021 0922 Last data filed at 11/09/2021 0800 Gross per 24 hour  Intake 960 ml  Output 300 ml  Net 660 ml    Filed Weights   11/07/21 0352 11/08/21 0500 11/09/21 0538  Weight: 82.4 kg 82.2 kg 81.3 kg    Telemetry    V-Paced, rare PVC, artifact  - Personally Reviewed  ECG    No - Personally Reviewed  Physical Exam   GEN: No acute distress.   Neck: No JVD. Cardiac: RRR, no murmurs, rubs, or gallops.  Respiratory: Diminished breath sounds at the bases bilaterally.  GI: Soft, nontender, non-distended.   MS: No edema; No deformity. Neuro:  Alert and oriented x 3; Nonfocal.  Psych: Normal affect.  Labs    Chemistry Recent Labs  Lab 11/05/21 0315 11/06/21 0413 11/07/21 0552 11/08/21 0432  NA 134* 138 139 138  K 5.1 4.2 4.4 3.7  CL 108 110 111 107  CO2 16* 20* 21* 19*  GLUCOSE 245* 153* 146* 156*  BUN 32* 26* 24* 25*  CREATININE 1.42* 1.00 1.10 0.99  CALCIUM 9.3 9.3 9.3 8.8*  PROT 6.8  --  6.5  --   ALBUMIN 3.2*  --  3.3*  --   AST 23  --  21  --   ALT 16  --  17  --  ALKPHOS 52  --  45  --   BILITOT 0.9  --  0.4  --   GFRNONAA 48* >60 >60 >60  ANIONGAP 10 8 7 12       Hematology Recent Labs  Lab 11/06/21 0413 11/07/21 0552 11/08/21 0432  WBC 6.1 5.1 5.5  RBC 4.42 3.87* 3.66*  HGB 12.2* 10.7* 10.2*  HCT 37.1* 32.2* 30.4*  MCV 83.9 83.2 83.1  MCH 27.6 27.6 27.9  MCHC 32.9 33.2 33.6  RDW 16.6* 16.6* 16.5*  PLT 277 266 244     Cardiac EnzymesNo results for input(s): TROPONINI in the last 168 hours. No results for input(s): TROPIPOC in the last 168 hours.   BNPNo results for input(s): BNP, PROBNP in the last 168 hours.   DDimer No results for input(s): DDIMER in the last 168 hours.   Radiology    US RENAL  Result Date: 11/06/2021 IMPRESSION: 1. Increased echogenicity of the right kidney and perirenal fluid, which may be secondary to an infectious or inflammatory process such as acute pyelonephritis. Correlation with urinalysis is recommended. 2. Simple left renal cyst. No additional follow-up or imaging is recommended. Electronically Signed   By: Virgina Norfolk M.D.   On: 11/06/2021 16:41    Cardiac Studies   2D echo 10/25/2021: 1. Left ventricular ejection fraction, by estimation, is 20  to 25%. The  left ventricle has severely decreased function. The left ventricle  demonstrates global hypokinesis. There is mild left ventricular  hypertrophy. Left ventricular diastolic parameters   are consistent with Grade II diastolic dysfunction (pseudonormalization).   2. Right ventricular systolic function is normal. The right ventricular  size is normal. Tricuspid regurgitation signal is inadequate for assessing  PA pressure.   3. Left atrial size was moderately dilated.   4. The mitral valve is normal in structure. Mild to moderate mitral valve  regurgitation. No evidence of mitral stenosis.   5. The aortic valve was not well visualized. Aortic valve regurgitation  is not visualized. Mild to moderate aortic valve sclerosis/calcification  is present, without any evidence of aortic stenosis.   6. There is borderline dilatation of the aortic root, measuring 37 mm.   7. The inferior vena cava is normal in size with greater than 50%  respiratory variability, suggesting right atrial pressure of 3 mmHg. __________   2D echo 07/24/2021:  1. Left ventricular ejection fraction, by estimation, is 20 to 25%. The  left ventricle has severely decreased function. The left ventricle  demonstrates global hypokinesis. There is moderate left ventricular  hypertrophy. Left ventricular diastolic  parameters are indeterminate.   2. Right ventricular systolic function is normal. The right ventricular  size is normal. Tricuspid regurgitation signal is inadequate for assessing  PA pressure.   3. Left atrial size was mildly dilated.   4. The mitral valve is degenerative. Mild mitral valve regurgitation.   5. The aortic valve is tricuspid. There is moderate calcification of the  aortic valve. There is moderate thickening of the aortic valve. Aortic  valve regurgitation is not visualized. Mild to moderate aortic valve  sclerosis/calcification is present,  without any evidence of aortic  stenosis. __________   [facility] 04/13/2021: Prox RCA lesion is 30% stenosed. Dist RCA lesion is 50% stenosed. Prox LAD to Mid LAD lesion is 20% stenosed. Ost Cx to Prox Cx lesion is 30% stenosed. 1st Mrg lesion is 40% stenosed. 2nd Diag lesion is 60% stenosed.   1.  Mild to moderate three-vessel coronary artery disease with severe  calcifications.  No obstructive lesions. 2.  Left ventricular angiography was not performed.  EF was severely reduced by echo. 3.  Right heart catheterization showed minimally elevated filling pressures, mild pulmonary hypertension and normal cardiac output.   Recommendations: The patient has nonischemic cardiomyopathy. Continue aggressive medical therapy. Volume status appears good. __________   2D echo 04/05/2021: 1. Left ventricular ejection fraction, by estimation, is 20 to 25%. The  left ventricle has severely decreased function. The left ventricle  demonstrates global hypokinesis. There is mild asymmetric left ventricular  hypertrophy. Left ventricular diastolic   parameters are consistent with Grade II diastolic dysfunction  (pseudonormalization). The average left ventricular global longitudinal  strain is -4.6 %. The global longitudinal strain is abnormal.   2. Right ventricular systolic function is moderately reduced. The right  ventricular size is normal.   3. The mitral valve is degenerative. Mild mitral valve regurgitation.   4. The aortic valve is tricuspid. Aortic valve regurgitation is not  visualized. Mild to moderate aortic valve sclerosis/calcification is  present, without any evidence of aortic stenosis.   5. The inferior vena cava is dilated in size with <50% respiratory  variability, suggesting right atrial pressure of 15 mmHg.  Patient Profile     87 y.o. male with history of nonobstructive CAD by LHC in 03/2021, HFrEF secondary to NICM, complete heart block status post PPM, lung cancer s/p radiation, chronic hypoxic respiratory  failure on supplemental oxygen at 3 L at baseline, splenic infarct on chronic OAC, AAA s/p engovascular grafting, PAD s/p multiple interventions, DM2, HTN, HLD, Bell's palsy, prostate cancer, vertigo, and OSA on CPAP who we are seeing for elevated troponin in the setting of severe diarrhea.  Assessment & Plan    1. Nonobstructive CAD with NSTEMI: -No angina -Recent LHC without obstructive disease  -Family prefers conservative management, no plans for repeat LHC at this time -Has completed 48 hours of heparin gtt -ASA, Toprol, Zetia, simvastatin and titrated dose of Imdur -PRN SL NTG at discharge   2. NICM: -Appears euvolemic -Continue Toprol, Entresto, spironolactone and Lasix  -Daily weights   3. Complete heart block: -Status post pacemaker -Device appears to be functioning normally -Follow up with EP  4. Severe diarrhea: -Improving, though did have a loose stool this morning  -C diff and GI panel negative  -Per primary service  -Outpatient GI workup recommended   5. History of splenic infarct: -Back on PTA Eliquis     For questions or updates, please contact [place] Please consult [email or web address] for contact info under Cardiology/STEMI.    Signed, Christell Faith, PA-C [place] Pager: [phone] 11/09/2021, 9:22 AM

## 2021-11-09 NOTE — Telephone Encounter (Signed)
Copied from [place] [id number]. Topic: Appointment Scheduling - Scheduling Inquiry for Clinic >> Nov 09, 2021 10:31 AM Oneta Rack wrote: Reason for CRM: Caller stats patient was just discharged from the hospital and would like to cancel his Medicare Wellness appointment. Caller states please call back in another month and she may [facility]

## 2021-11-09 NOTE — Consult Note (Signed)
[facility] Inpatient Consult   11/09/2021  Howard [name] 07/24/1931 [id number]  Patient chart has been reviewed for readmissions less than 30 days and for high risk score for unplanned readmissions.  Patient assessed for community Emily Management follow up needs.   Per review, primary provider office does TOC calls. Embedded chronic care management services available at PCP office. Referral for post hospital chronic care management and care coordination services will be placed.  Of note, [facility] Care Management services does not replace or interfere with any services that are arranged by inpatient case management or social work.   [name], MSN, RN [facility] [employer] [phone]  Toll free office [phone]

## 2021-11-09 NOTE — Care Management Important Message (Signed)
Important Message  Patient Details  Name: Howard [name] MRN: [id number] Date of Birth: 09-25-1931   Medicare Important Message Given:  Yes     Dannette Barbara 11/09/2021, 2:27 PM

## 2021-11-09 NOTE — TOC Transition Note (Signed)
Transition of Care [facility]) - CM/SW Discharge Note   Patient Details  Name: Howard [name] MRN: [id number] Date of Birth: Oct 30, 1931  Transition of [facility]) CM/SW Contact:  Alberteen Sam, LCSW Phone Number: 11/09/2021, 11:53 AM   Clinical Narrative:     Patient to discharge home today with home health services, is active with [facility] for PT OT and RN, [place] orders are in. No DME needs noted. Family to transport home.   CSW signing off.   Final next level of care: [facility] Barriers to Discharge: No Barriers Identified   Patient Goals and CMS Choice Patient states their goals for this hospitalization and ongoing recovery are:: to go home CMS Medicare.gov Compare Post Acute Care list provided to:: Patient Represenative (must comment) Choice offered to / list presented to : Spouse  Discharge Placement                    Patient and family notified of of transfer: 11/09/21  Discharge Plan and Services     Post Acute Care Choice: Home Health                    HH Arranged: PT, OT, RN [facility] Agency: [place] ([place]) Date HH Agency Contacted: 11/09/21 Time [place]ann: 1152 Representative spoke with at [facility]: [place] ([place]) Interventions     Readmission Risk Interventions Readmission Risk Prevention Plan 11/07/2021 10/26/2021 09/02/2019  Transportation Screening Complete Complete Complete  PCP or Specialist Appt within 5-7 Days - - -  PCP or Specialist Appt within 3-5 Days - Complete Complete  Home Care Screening - - -  Medication Review (RN CM) - - -  [place] or [place] - Complete Complete  Social Work Consult for [place] Planning/Counseling - Complete Complete  Palliative Care Screening - Not Applicable Not Applicable  Medication Review Press photographer) Complete Complete Complete  PCP or Specialist appointment within 3-5 days of discharge Complete - -  [place] or Home  Care Consult Complete - -  SW Recovery Care/Counseling Consult Complete - -  Palliative Care Screening Not Applicable - -  [place] Not Applicable - -  Some recent data might be hidden

## 2021-11-11 LAB — NOROVIRUS GROUP 1 & 2 BY PCR, STOOL
Norovirus 1 by PCR: NEGATIVE
Norovirus 2  by PCR: NEGATIVE

## 2021-11-12 ENCOUNTER — Telehealth: Payer: Self-pay | Admitting: Family Medicine

## 2021-11-12 ENCOUNTER — Telehealth: Payer: Self-pay | Admitting: *Deleted

## 2021-11-12 DIAGNOSIS — I5023 Acute on chronic systolic (congestive) heart failure: Secondary | ICD-10-CM | POA: Diagnosis not present

## 2021-11-12 DIAGNOSIS — E119 Type 2 diabetes mellitus without complications: Secondary | ICD-10-CM | POA: Diagnosis not present

## 2021-11-12 DIAGNOSIS — C3491 Malignant neoplasm of unspecified part of right bronchus or lung: Secondary | ICD-10-CM | POA: Diagnosis not present

## 2021-11-12 DIAGNOSIS — E785 Hyperlipidemia, unspecified: Secondary | ICD-10-CM | POA: Diagnosis not present

## 2021-11-12 DIAGNOSIS — I110 Hypertensive heart disease with heart failure: Secondary | ICD-10-CM | POA: Diagnosis not present

## 2021-11-12 DIAGNOSIS — J9621 Acute and chronic respiratory failure with hypoxia: Secondary | ICD-10-CM | POA: Diagnosis not present

## 2021-11-12 NOTE — Telephone Encounter (Signed)
Please advise 

## 2021-11-12 NOTE — Chronic Care Management (AMB) (Signed)
  Chronic Care Management   Note  11/12/2021 Name: Howard [name] MRN: [id number] DOB: 03-Mar-1931  Howard [name] is a 87 y.o. year old male who is a primary care patient of Jerrol Banana., MD. I reached out to [name] by phone today in response to a referral sent by [name] PCP.  [name] was given information about Chronic Care Management services today including:  CCM service includes personalized support from designated clinical staff supervised by his physician, including individualized plan of care and coordination with other care providers 24/7 contact phone numbers for assistance for urgent and routine care needs. Service will only be billed when office clinical staff spend 20 minutes or more in a month to coordinate care. Only one practitioner may furnish and bill the service in a calendar month. The patient may stop CCM services at any time (effective at the end of the month) by phone call to the office staff. The patient is responsible for co-pay (up to 20% after annual deductible is met) if co-pay is required by the individual health plan.   Patient agreed to services and verbal consent obtained.   Follow up plan: Telephone appointment with care management team member scheduled for: 11/23/2021  Julian Hy, [place] Management  Direct Dial: [phone]

## 2021-11-12 NOTE — Telephone Encounter (Signed)
Home Health Verbal Orders - Caller/Agency: [facility]  Callback Number: [phone] Requesting Skilled Nursing Frequency: continuing home health nursing /since pt is out of hospital

## 2021-11-12 NOTE — Telephone Encounter (Signed)
Per Mardene Celeste she just want Dr/ Rosanna Randy to be aware that patient had severe diarrhea after he got out of the Hospital. Patient is now better. She just wasn't sure if the Metformin was the one causing the diarrhea and just want the provider to know and to discuss next time he sees the patient.

## 2021-11-12 NOTE — Telephone Encounter (Signed)
Verbal orders given  

## 2021-11-13 ENCOUNTER — Other Ambulatory Visit: Payer: Medicare Other | Admitting: Student

## 2021-11-13 ENCOUNTER — Other Ambulatory Visit: Payer: Self-pay

## 2021-11-13 DIAGNOSIS — I110 Hypertensive heart disease with heart failure: Secondary | ICD-10-CM | POA: Diagnosis not present

## 2021-11-13 DIAGNOSIS — Z515 Encounter for palliative care: Secondary | ICD-10-CM

## 2021-11-13 DIAGNOSIS — R0602 Shortness of breath: Secondary | ICD-10-CM

## 2021-11-13 DIAGNOSIS — I5023 Acute on chronic systolic (congestive) heart failure: Secondary | ICD-10-CM | POA: Diagnosis not present

## 2021-11-13 DIAGNOSIS — R531 Weakness: Secondary | ICD-10-CM

## 2021-11-13 DIAGNOSIS — E785 Hyperlipidemia, unspecified: Secondary | ICD-10-CM | POA: Diagnosis not present

## 2021-11-13 DIAGNOSIS — C3491 Malignant neoplasm of unspecified part of right bronchus or lung: Secondary | ICD-10-CM | POA: Diagnosis not present

## 2021-11-13 DIAGNOSIS — J9621 Acute and chronic respiratory failure with hypoxia: Secondary | ICD-10-CM | POA: Diagnosis not present

## 2021-11-13 DIAGNOSIS — C342 Malignant neoplasm of middle lobe, bronchus or lung: Secondary | ICD-10-CM

## 2021-11-13 DIAGNOSIS — E119 Type 2 diabetes mellitus without complications: Secondary | ICD-10-CM | POA: Diagnosis not present

## 2021-11-13 NOTE — Progress Notes (Signed)
Therapist, nutritional Palliative Care Consult Note Telephone: [phone]  Fax: [phone]    Date of encounter: 11/13/21 1:39 PM PATIENT NAME: Howard [name] [address] [place] [address]   [phone] (home)  DOB: [birth year] MRN: [id number] PRIMARY CARE PROVIDER:    Maple [name]., MD,  [address] [place] [address] [phone]  REFERRING PROVIDER:   Maple [name]., MD [address] [place],  [place] [address] [phone]  RESPONSIBLE PARTY:    Contact Information     Name Relation Home Work Mobile   Howard [name] Spouse [phone]  [phone]   Howard, [name]   [phone]        I met face to face with patient and family in the home. Palliative Care was asked to follow this patient by consultation request of  Howard [name].,* to address advance care planning and complex medical decision making. This is a follow up visit.                                   ASSESSMENT AND PLAN / RECOMMENDATIONS:   Advance Care Planning/Goals of Care: Goals include to maximize quality of life and symptom management. Our advance care planning conversation included a discussion about:    The value and importance of advance care planning  Experiences with loved ones who have been seriously ill or have died  Exploration of personal, cultural or spiritual beliefs that might influence medical decisions  Exploration of goals of care in the event of a sudden injury or illness  MOST form completed today; reflects DNR, Full scope of treatment with the exception of intubation or mechanical ventilation, antibiotics and IV fluids as indicated, feeding tube for trial period.  CODE STATUS: DNR  Symptom Management/Plan:  Acute on chronic combined diastolic and systolic heart failure-Continue Entresto, furosemide, spironolactone, metoprolol, as directed. Recommend continuing daily weights; notify PCP,  cardiology if 3 pound weight gain in 24 hours or 5 pound in a week, worsening shortness of breath, edema. Follow up with cardiology as scheduled.  Shortness of breath-secondary to COPD, malignant neoplasm of right lung. Continue Trelegy Ellipta daily as directed, oxygen via nasal canula at 3 lpm.   Generalized weakness-patient encouraged to use walker, rest breaks encouraged. HH PT/OT as directed.    Malignant neoplasm of right lung-stage IIIb; status post SBRT. Recent CT shows post radiation changes; consistent with treatment response. Patient to have f/u chest CT every 3 months. F/u with oncology as scheduled.  Follow up Palliative Care Visit: Palliative care will continue to follow for complex medical decision making, advance care planning, and clarification of goals. Return 2 weeks or prn.  I spent 30 minutes providing this consultation. More than 50% of the time in this consultation was spent in counseling and care coordination.   PPS: 40%  HOSPICE ELIGIBILITY/DIAGNOSIS: TBD  Chief Complaint: Palliative Medicine follow up visit.   HISTORY OF PRESENT ILLNESS:  Howard [name] is a 87 y.o. year old male  with combined diastolic and systolic heart failure, EF 20%, COPD, nonischemic cardiomyopathy, T2DM, dx right lung cancer in April 2022; s/p 35 radiation treatments. Patient recently hospitalized 11/2-11/05/2021 due to acute on chronic hypoxic respiratory failure, pulmonary infiltrate of right lung. Hospitalization 11/14 through 11/09/2021 due to NSTEMI, CAD, complete atrioventricular block, chronic systolic congestive heart failure, CKD stage III, diarrhea/loose stools.  Palliative NP received  request from family that they are ready to complete MOST form. Palliative NP met with patient, spouse and son Howard [name]; we reviewed MOST form and it is completed today. Patient states he is doing a little better after most recent hospitalization. He is currently receiving therapy services in the home. His  wife reports patient having diarrhea, loose stools. Stools have started to improve he is taking Imodium as needed, Metamucil to bulk his stools, probiotics, activity a yogurt. C-diff, GI panel and Norovirus were negative during recent hospitalization.  History obtained from review of EMR, discussion with primary team, and interview with family, facility staff/caregiver and/or Howard [name].  I reviewed available labs, medications, imaging, studies and related documents from the EMR.  Records reviewed and summarized above.     Physical Exam: Constitutional: NAD General: frail appearing EYES: anicteric sclera, lids intact, no discharge  ENMT: intact hearing with hearing aids, oral mucous membranes moist, dentition intact CV: S1S2, RRR, no LE edema Pulmonary: no increased work of breathing, no cough GU: deferred MSK: moves all extremities, ambulatory with rollator walker Skin: warm and dry, no rashes or wounds on visible skin Neuro: generalized weakness, A & O x 3 Psych: non-anxious affect, pleasant Hem/lymph/immuno: no widespread bruising   Thank you for the opportunity to participate in the care of Howard [name].  The palliative care team will continue to follow. Please call our office at [phone] if we can be of additional assistance.   Howard Slocumb, NP   COVID-19 PATIENT SCREENING TOOL Asked and negative response unless otherwise noted:   Have you had symptoms of covid, tested positive or been in contact with someone with symptoms/positive test in the past 5-10 days? No

## 2021-11-14 ENCOUNTER — Encounter: Payer: Self-pay | Admitting: Gastroenterology

## 2021-11-14 ENCOUNTER — Ambulatory Visit (INDEPENDENT_AMBULATORY_CARE_PROVIDER_SITE_OTHER): Payer: Medicare Other | Admitting: Gastroenterology

## 2021-11-14 ENCOUNTER — Other Ambulatory Visit: Payer: Self-pay

## 2021-11-14 VITALS — BP 127/74 | HR 87 | Temp 97.5°F | Ht 69.0 in | Wt 186.2 lb

## 2021-11-14 DIAGNOSIS — R197 Diarrhea, unspecified: Secondary | ICD-10-CM | POA: Diagnosis not present

## 2021-11-14 DIAGNOSIS — I701 Atherosclerosis of renal artery: Secondary | ICD-10-CM | POA: Diagnosis not present

## 2021-11-14 NOTE — Progress Notes (Signed)
Gastroenterology Consultation  Referring Provider:     Jerrol Banana.,* Primary Care Physician:  Jerrol Banana., MD Primary Gastroenterologist:  Dr. Allen Norris     Reason for Consultation:     Diarrhea        HPI:   Howard [name] is a 87 y.o. y/o male referred for consultation & management of Diarrhea by Dr. Rosanna Randy, Retia Passe., MD.  This patient comes in today with a recent history of having a heart attack and was in the hospital.  The patient was then admitted again with a presumed respiratory illness and received antibiotics.  The patient had profuse diarrhea while in the hospital and then has gotten better but recently started to have worsening diarrhea.  The patient reports that the diarrhea will wake him up from sleep and he is soils his bed. A week ago the patient had C. Difficile and a GI panel sent off because of his diarrhea but it has significant leak worse since then.  He reports that is somewhat controlled with Imodium and when he takes a probiotic.  He also reports that some days are worse than others.  He does partaken dairy products and has a history on his chart of being seen by [facility] GI in 2019 for alternating diarrhea and constipation.  The patient has an ejection fraction of 20-25% with chronic diastolic and systolic dysfunction with CHF diabetes hypertension and hyperlipidemia GERD and has been diagnosed with stage III lung cancer.  The patient is not able to give much of the history and a lot of it is supplemented by the patient's wife.   Past Medical History:  Diagnosis Date   Arthritis    Bell palsy    Bell's palsy 04/12/2015   Cancer [facility])    prostate and skin   Chronic combined systolic and diastolic CHF, NYHA class 1 ([place])    a. 07/2014 Echo: EF 35-40%, Gr 1 DD.   Complete heart block ([place])    a. 11/2010 s/p SJM 2210 [employer] DC PPM, ser# [id number].   Depression    Diabetes mellitus without complication ([place])    Fall 11-10-14   GERD  (gastroesophageal reflux disease)    History of prostate cancer    Hyperlipidemia    Hypertension    LBBB (left bundle branch block)    Left-sided Bell's palsy    Lung cancer ([place]) 2016   NICM (nonischemic cardiomyopathy) ([place])    a. 07/2014 Echo: EF 35-40%, mid-apicalanteroseptal DK, Gr 1 DD, mild-mod dil LA.   Non-obstructive CAD    a. 07/2014 Abnl MV;  b. 08/2014 Cath: LM nl, LAD 30p, RI 40p, LCX nl, OM1 40, RCA dominant 30p, 70d-->Med Rx.   Poor balance    Presence of permanent cardiac pacemaker    Sleep apnea    a. cpap   Vertigo    WPW (Wolff-Parkinson-White syndrome)    a. S/P RFCA 1991.    Past Surgical History:  Procedure Laterality Date   ABDOMINAL AORTIC ENDOVASCULAR STENT GRAFT  08/25/2019   Procedure: ABDOMINAL AORTIC ENDOVASCULAR STENT GRAFT;  Surgeon: Algernon Huxley, MD;  Location: [facility] ORS;  Service: Vascular;;   ANGIOPLASTY Left 08/25/2019   Procedure: ANGIOPLASTY;  Surgeon: Algernon Huxley, MD;  Location: [facility] ORS;  Service: Vascular;  Laterality: Left;  left SFA and stent placement   APPLICATION OF WOUND VAC Left 06/07/2015   Procedure: APPLICATION OF WOUND VAC;  Surgeon: Robert Bellow, MD;  Location:  [facility] ORS;  Service: General;  Laterality: Left;  left upper back   Charles Mix  08/26/2014   Single vessel obstructive CAD   CARPAL TUNNEL RELEASE  04-04-15   [facility]   CATARACT EXTRACTION  07-31-11 and 09-18-11   Catheter ablation  1991   for WPW   cervical fusion     CHOLECYSTECTOMY  09-07-14   ENDARTERECTOMY FEMORAL Left 08/25/2019   Procedure: ENDARTERECTOMY FEMORAL;  Surgeon: Algernon Huxley, MD;  Location: [facility] ORS;  Service: Vascular;  Laterality: Left;  common and produndis    ENDOVASCULAR REPAIR/STENT GRAFT Right 08/25/2019   Procedure: ENDOVASCULAR REPAIR/STENT GRAFT;  Surgeon: Algernon Huxley, MD;  Location: [facility] ORS;  Service: Vascular;  Laterality: Right;  renal artery   HAND SURGERY     right 1993; left 2005   [place] / REPLACE / REMOVE PACEMAKER     INSERTION OF ILIAC STENT Bilateral 08/25/2019   Procedure: INSERTION OF ILIAC STENT;  Surgeon: Algernon Huxley, MD;  Location: [facility] ORS;  Service: Vascular;  Laterality: Bilateral;   JOINT REPLACEMENT Left 2013   knee   JOINT REPLACEMENT Right 2004   knee   KNEE SURGERY     left knee 1991 and 1992; right knee [place] N/A 08/26/2014   Procedure: LEFT HEART CATHETERIZATION WITH CORONARY ANGIOGRAM;  Surgeon: [name], MD;  Location: [facility] CATH LAB;  Service: Cardiovascular;  Laterality: N/A;   LOWER EXTREMITY ANGIOGRAPHY Left 08/23/2019   Procedure: Lower Extremity Angiography;  Surgeon: Algernon Huxley, MD;  Location: [place] CV LAB;  Service: Cardiovascular;  Laterality: Left;   LUMBAR LAMINECTOMY/DECOMPRESSION MICRODISCECTOMY N/A 06/07/2014   Procedure: LUMBAR FOUR TO FIVE LUMBAR LAMINECTOMY/DECOMPRESSION MICRODISCECTOMY 1 LEVEL;  Surgeon: [name], MD;  Location: [place] NEURO ORS;  Service: Neurosurgery;  Laterality: N/A;   LUNG BIOPSY Right 2016   Dr [name]   MOHS SURGERY     PACEMAKER INSERTION     PPM-- St Jude 11/30/10 by [name]   PPM GENERATOR CHANGEOUT N/A 07/09/2019   Procedure: PPM GENERATOR CHANGEOUT;  Surgeon: Deboraha Sprang, MD;  Location: [place] CV LAB;  Service: Cardiovascular;  Laterality: N/A;   PROSTATE SURGERY     cancer--1998, prostatectomy   REPLACEMENT TOTAL KNEE     2004   RIGHT/LEFT HEART CATH AND CORONARY ANGIOGRAPHY Bilateral 04/13/2021   Procedure: RIGHT/LEFT HEART CATH AND CORONARY ANGIOGRAPHY;  Surgeon: Wellington Hampshire, MD;  Location: [place] CV LAB;  Service: Cardiovascular;  Laterality: Bilateral;   ruptured disc     1962 and 1998   TEE WITHOUT CARDIOVERSION N/A 09/01/2019   Procedure: TRANSESOPHAGEAL ECHOCARDIOGRAM (TEE);  Surgeon: [name], MD;  Location: [facility] ORS;  Service: Cardiovascular;  Laterality: N/A;   TEMPORARY PACEMAKER N/A 07/09/2019    Procedure: TEMPORARY PACEMAKER;  Surgeon: Deboraha Sprang, MD;  Location: [place] CV LAB;  Service: Cardiovascular;  Laterality: N/A;   TRIGGER FINGER RELEASE  01-24-15   WOUND DEBRIDEMENT Left 06/07/2015   Procedure: DEBRIDEMENT WOUND;  Surgeon: Robert Bellow, MD;  Location: [facility] ORS;  Service: General;  Laterality: Left;  left upper back    Prior to Admission medications   Medication Sig Start Date End Date Taking? Authorizing Provider  acetaminophen (TYLENOL) 500 MG tablet Take 1,000 mg by mouth every 6 (six) hours as needed for moderate pain.   Yes  [provider]  albuterol (VENTOLIN HFA) 108 (90 Base) MCG/ACT inhaler INHALE 2 PUFFS INTO LUNGS EVERY 6 HOURS AS NEEDED FOR WHEEZING OR SHORTNESS OF BREATH 07/23/21  Yes Tyler Pita, MD  aspirin EC 81 MG tablet Take 81 mg by mouth daily.   Yes [provider]  cetirizine (ZYRTEC) 10 MG tablet Take 10 mg by mouth daily.   Yes [provider]  Cholecalciferol (VITAMIN D3) 1000 UNITS CAPS Take 1,000 Units by mouth daily.   Yes [provider]  donepezil (ARICEPT) 5 MG tablet Take 1 tablet (5 mg total) by mouth daily. 10/23/21  Yes Jerrol Banana., MD  ELIQUIS 5 MG TABS tablet TAKE 1 TABLET TWICE A DAY  (SWITCHED FROM PLAVIX) Patient taking differently: Take 5 mg by mouth 2 (two) times daily. 08/28/21  Yes Algernon Huxley, MD  ezetimibe (ZETIA) 10 MG tablet TAKE 1 TABLET DAILY 09/24/21  Yes Gollan, Kathlene November, MD  Fluticasone-Umeclidin-Vilant (TRELEGY ELLIPTA) 100-62.5-25 MCG/INH AEPB Inhale 1 puff into the lungs daily. 02/14/21  Yes Tyler Pita, MD  furosemide (LASIX) 20 MG tablet TAKE 1 TABLET BY MOUTH ONCE DAILY 10/09/21  Yes Gollan, Kathlene November, MD  Iron-Vitamin C (VITRON-C) 65-125 MG TABS Take 1 tablet by mouth daily. 04/16/21  Yes Earlie Server, MD  isosorbide mononitrate (IMDUR) 60 MG 24 hr tablet Take 1 tablet (60 mg total) by mouth daily. 11/10/21  Yes Elgergawy, Silver Huguenin, MD  megestrol (MEGACE) 40  MG tablet Take 1 tablet (40 mg total) by mouth 2 (two) times daily. 09/18/21  Yes Earlie Server, MD  metFORMIN (GLUCOPHAGE) 500 MG tablet Take 1 tablet (500 mg total) by mouth 2 (two) times daily with a meal. 10/23/21  Yes Jerrol Banana., MD  metoprolol succinate (TOPROL-XL) 25 MG 24 hr tablet TAKE 1 TABLET AT BEDTIME 09/24/21  Yes Gollan, Kathlene November, MD  montelukast (SINGULAIR) 10 MG tablet Take 10 mg by mouth daily as needed (sneezing/allergies.).   Yes [provider]  Multiple Vitamin (MULTIVITAMIN WITH MINERALS) TABS tablet Take 1 tablet by mouth daily. One-A-Day Multivitamin   Yes [provider]  nitroGLYCERIN (NITROSTAT) 0.4 MG SL tablet Place 1 tablet (0.4 mg total) under the tongue every 5 (five) minutes as needed for chest pain. Do not exceed 2 tablets/day. 11/09/21  Yes Elgergawy, Silver Huguenin, MD  omeprazole (PRILOSEC) 40 MG capsule TAKE 1 CAPSULE TWICE DAILY AS NEEDED 02/12/21  Yes Chrismon, [name], PA-C  sacubitril-valsartan (ENTRESTO) 49-51 MG Take 1 tablet by mouth 2 (two) times daily. 07/25/21 11/22/21 Yes Gollan, Kathlene November, MD  sertraline (ZOLOFT) 50 MG tablet Take 1 tablet (50 mg total) by mouth daily. 10/23/21  Yes Jerrol Banana., MD  simvastatin (ZOCOR) 40 MG tablet TAKE 1 TABLET AT BEDTIME 03/23/21  Yes Gollan, Kathlene November, MD  SODIUM BICARBONATE PO Take 1 tablet by mouth 3 (three) times daily.   Yes [provider]  spironolactone (ALDACTONE) 25 MG tablet Take 0.5 tablets (12.5 mg total) by mouth daily. 11/09/21 11/04/22 Yes Elgergawy, Silver Huguenin, MD    Family History  Problem Relation Age of Onset   Heart attack Mother    Hyperlipidemia Mother    CAD Other    Prostate cancer Neg Hx    Cancer Neg Hx      Social History   Tobacco Use   Smoking status: Former    Packs/day: 1.00    Years: 56.00    Pack years: 56.00  Types: Cigarettes    Quit date: 2011    Years since quitting: 11.9   Smokeless tobacco: Never  Vaping Use   Vaping Use:  Never used  Substance Use Topics   Alcohol use: No   Drug use: No    Allergies as of 11/14/2021 - Review Complete 11/14/2021  Allergen Reaction Noted   Sulfa antibiotics Rash 11/29/2010    Review of Systems:    All systems reviewed and negative except where noted in HPI.   Physical Exam:  BP 127/74 (BP Location: Left Arm, Patient Position: Sitting, Cuff Size: Large)   Pulse 87   Temp (!) 97.5 F (36.4 C) (Temporal)   Ht 5\' 9"  (1.753 m)   Wt 186 lb 3.2 oz (84.5 kg)   BMI 27.50 kg/m  No LMP for male patient. General:   Alert,  Well-developed, well-nourished, pleasant and cooperative in NAD Head:  Normocephalic and atraumatic. Eyes:  Sclera clear, no icterus.   Conjunctiva pink. Ears:  Normal auditory acuity. Neck:  Supple; no masses or thyromegaly. Lungs:  Respirations even and unlabored.  Clear throughout to auscultation.   No wheezes, crackles, or rhonchi. No acute distress. Heart:  Regular rate and rhythm; no murmurs, clicks, rubs, or gallops. Abdomen:  Normal bowel sounds.  No bruits.  Soft, non-tender and non-distended without masses, hepatosplenomegaly or hernias noted.  No guarding or rebound tenderness.  Negative Carnett sign.   Rectal:  Deferred.  Pulses:  Normal pulses noted. Extremities:  No clubbing or edema.  No cyanosis. Neurologic:  Alert and oriented x3;  grossly normal neurologically. Skin:  Intact without significant lesions or rashes.  No jaundice. Lymph Nodes:  No significant cervical adenopathy. Psych:  Alert and cooperative. Normal mood and affect.  Imaging Studies: CT Angio Chest PE W and/or Wo Contrast  Result Date: 10/24/2021 CLINICAL DATA:  Acute onset shortness of breath. History of lung cancer. EXAM: CT ANGIOGRAPHY CHEST WITH CONTRAST TECHNIQUE: Multidetector CT imaging of the chest was performed using the standard protocol during bolus administration of intravenous contrast. Multiplanar CT image reconstructions and MIPs were obtained to evaluate  the vascular anatomy. CONTRAST:  26mL OMNIPAQUE IOHEXOL 350 MG/ML SOLN COMPARISON:  CT chest dated September 12, 2021. FINDINGS: Cardiovascular: Satisfactory opacification of the pulmonary arteries to the segmental level. No evidence of pulmonary embolism. Unchanged mild cardiomegaly. No pericardial effusion. No thoracic aortic aneurysm. Coronary, aortic arch, and branch vessel atherosclerotic vascular disease. Mediastinum/Nodes: No enlarged mediastinal, hilar, or axillary lymph nodes. Unchanged index subcarinal lymph node measuring 1.1 cm in short axis. Thyroid gland, trachea, and esophagus demonstrate no significant findings. Lungs/Pleura: New patchy ground-glass densities with areas of nodular consolidation in the right upper, right middle, and right lower lobes, with inter- and intralobular septal thickening. Post treatment related dense fibrotic consolidation bronchiectasis in the medial right upper and lower lobes has progressed. New small right and trace left pleural effusions. No pneumothorax. Unchanged calcified scarring in the lingula and medial right middle lobe. Mild dependent subsegmental atelectasis in the posterior left lower lobe. Mild emphysema. New 9 x 8 mm spiculated density in the right upper lobe (series 6, image 22). Upper Abdomen: No acute abnormality. Musculoskeletal: No chest wall abnormality. No acute or significant osseous findings. Review of the MIP images confirms the above findings. IMPRESSION: 1. No evidence of pulmonary embolism. 2. New patchy ground-glass densities with areas of nodular consolidation in the right upper, right middle, and right lower lobes, with inter- and intralobular septal thickening. Differential considerations include multifocal  pneumonia, asymmetric pulmonary edema, and pulmonary hemorrhage. 3. New small right and trace left pleural effusions. 4. New 9 x 8 mm spiculated density in the right upper lobe, presumably related to underlying acute process in the right  lung. However, attention on follow-up imaging is recommended. 5. Aortic Atherosclerosis (ICD10-I70.0) and Emphysema (ICD10-J43.9). Electronically Signed   By: Titus Dubin M.D.   On: 10/24/2021 11:14   US RENAL  Result Date: 11/06/2021 CLINICAL DATA:  An area. EXAM: RENAL / URINARY TRACT ULTRASOUND COMPLETE COMPARISON:  February 23, 2014 FINDINGS: Right Kidney: Renal measurements: 11.7 cm x 4.1 cm x 4.2 cm = volume: 106 mL. Diffusely increased echogenicity of the renal parenchyma is noted. No mass or hydronephrosis visualized. Left Kidney: Renal measurements: 12.1 cm x 6.1 cm x 5.5 cm = volume: 212 mL. Echogenicity within normal limits. A 2.7 cm x 1.5 cm x 2.2 cm well-defined, anechoic structure is seen within the lower pole of the left kidney. No abnormal flow is seen within this region on color Doppler evaluation. No hydronephrosis is visualized. Bladder: Appears normal for degree of bladder distention. Other: A mild amount of right-sided perirenal fluid is noted. IMPRESSION: 1. Increased echogenicity of the right kidney and perirenal fluid, which may be secondary to an infectious or inflammatory process such as acute pyelonephritis. Correlation with urinalysis is recommended. 2. Simple left renal cyst. No additional follow-up or imaging is recommended. Electronically Signed   By: Virgina Norfolk M.D.   On: 11/06/2021 16:41   DG Chest Portable 1 View  Result Date: 11/05/2021 CLINICAL DATA:  Dyspnea EXAM: PORTABLE CHEST 1 VIEW COMPARISON:  10/28/2021 FINDINGS: Lung volumes are small. Developing left basilar atelectasis or infiltrate. Stable subtle infiltrate within the right mid lung zone. No pneumothorax or pleural effusion. Cardiac size is within normal limits. Left subclavian dual lead pacemaker is unchanged. Pulmonary vascularity is normal. No acute bone abnormality. IMPRESSION: Pulmonary hypoinflation. Developing left basilar atelectasis or infiltrate. Stable right mid lung zone subtle pulmonary  infiltrate. Electronically Signed   By: Fidela Salisbury M.D.   On: 11/05/2021 03:34   DG Chest Port 1 View  Result Date: 10/28/2021 CLINICAL DATA:  Acute shortness of breath. Hypoxemia. Lung carcinoma. EXAM: PORTABLE CHEST 1 VIEW COMPARISON:  10/25/2021 FINDINGS: The heart size and mediastinal contours are within normal limits. Dual lead transvenous pacemaker remains in appropriate position. Aortic atherosclerotic calcification noted. Low lung volumes are again seen. Infiltrate or atelectasis is again seen in the inferior right upper lobe, abutting the minor fissure, without significant change. No new or increased areas of pulmonary opacity are seen. No evidence of pleural effusion. IMPRESSION: No significant change in right upper lobe infiltrate or atelectasis. Electronically Signed   By: Marlaine Hind M.D.   On: 10/28/2021 09:45   DG Chest Port 1 View  Result Date: 10/25/2021 CLINICAL DATA:  87 year old male with history of shortness of breath and lung cancer with recent abnormal imaging. EXAM: PORTABLE CHEST 1 VIEW COMPARISON:  Chest CT of October 24, 2021 and chest radiograph of October 24, 2021. FINDINGS: LEFT-sided dual lead pacer device, leads project over the cardiac silhouette and power pack over the LEFT chest as before. EKG leads project over the chest. Cardiomediastinal contours are stable. Increased interstitial and alveolar opacities seen more on the RIGHT than the LEFT have diminished in the short interval. Bandlike area of airspace disease in the RIGHT mid chest. No pneumothorax. On limited assessment there is no acute skeletal process. IMPRESSION: Increased interstitial and alveolar opacities  seen on the RIGHT than the LEFT have diminished in the short interval. Likely reflecting improving edema and or volume overload. Bandlike area of airspace disease in the RIGHT mid chest likely atelectatic changes. Persistent basilar opacities in the RIGHT medial lower chest related to volume loss and post  treatment changes, superimposed pneumonia would be difficult to exclude. Electronically Signed   By: Zetta Bills M.D.   On: 10/25/2021 11:32   DG Chest Portable 1 View  Result Date: 10/24/2021 CLINICAL DATA:  Shortness of breath. EXAM: PORTABLE CHEST 1 VIEW COMPARISON:  CT chest dated September 12, 2021. Chest x-ray dated July 03, 2021. FINDINGS: Left chest wall pacemaker again noted. Unchanged mild cardiomegaly. New hazy airspace disease in the right mid and lower lung. Unchanged scarring in the left lung base and post treatment changes in the medial right lower lobe. No pleural effusion or pneumothorax. No acute osseous abnormality. IMPRESSION: 1. New hazy airspace disease in the right mid and lower lung concerning for pneumonia. Electronically Signed   By: Titus Dubin M.D.   On: 10/24/2021 08:54   ECHOCARDIOGRAM COMPLETE  Result Date: 10/25/2021    ECHOCARDIOGRAM REPORT   Patient Name:   KARTEL WOLBERT Date of Exam: 10/25/2021 Medical Rec #:  [id number]      Height:       69.0 in Accession #:    [id number]     Weight:       185.2 lb Date of Birth:  Dec 05, 1931       BSA:          2.000 m Patient Age:    69 years       BP:           123/69 mmHg Patient Gender: M              HR:           106 bpm. Exam Location:  [facility] Procedure: 2D Echo, Color Doppler and Cardiac Doppler Indications:     Elevated troponin  History:         Patient has prior history of Echocardiogram examinations, most                  recent 07/24/2021. CHF, CAD, Pacemaker; Risk Factors:Diabetes,                  Hypertension and Dyslipidemia. Hx of radiation therapy.  Sonographer:     Charmayne Sheer Referring Phys:  [id number] AMIN Diagnosing Phys: [name] MD IMPRESSIONS  1. Left ventricular ejection fraction, by estimation, is 20 to 25%. The left ventricle has severely decreased function. The left ventricle demonstrates global hypokinesis. There is mild left ventricular hypertrophy. Left ventricular diastolic parameters  are  consistent with Grade II diastolic dysfunction (pseudonormalization).  2. Right ventricular systolic function is normal. The right ventricular size is normal. Tricuspid regurgitation signal is inadequate for assessing PA pressure.  3. Left atrial size was moderately dilated.  4. The mitral valve is normal in structure. Mild to moderate mitral valve regurgitation. No evidence of mitral stenosis.  5. The aortic valve was not well visualized. Aortic valve regurgitation is not visualized. Mild to moderate aortic valve sclerosis/calcification is present, without any evidence of aortic stenosis.  6. There is borderline dilatation of the aortic root, measuring 37 mm.  7. The inferior vena cava is normal in size with greater than 50% respiratory variability, suggesting right atrial pressure of 3 mmHg. FINDINGS  Left Ventricle: Left ventricular  ejection fraction, by estimation, is 20 to 25%. The left ventricle has severely decreased function. The left ventricle demonstrates global hypokinesis. The left ventricular internal cavity size was normal in size. There is mild left ventricular hypertrophy. Left ventricular diastolic parameters are consistent with Grade II diastolic dysfunction (pseudonormalization). Right Ventricle: The right ventricular size is normal. No increase in right ventricular wall thickness. Right ventricular systolic function is normal. Tricuspid regurgitation signal is inadequate for assessing PA pressure. Left Atrium: Left atrial size was moderately dilated. Right Atrium: Right atrial size was normal in size. Pericardium: There is no evidence of pericardial effusion. Mitral Valve: The mitral valve is normal in structure. There is mild thickening of the mitral valve leaflet(s). Mild to moderate mitral valve regurgitation. No evidence of mitral valve stenosis. MV peak gradient, 11.8 mmHg. The mean mitral valve gradient  is 4.0 mmHg. Tricuspid Valve: The tricuspid valve is normal in structure. Tricuspid  valve regurgitation is not demonstrated. No evidence of tricuspid stenosis. Aortic Valve: The aortic valve was not well visualized. Aortic valve regurgitation is not visualized. Mild to moderate aortic valve sclerosis/calcification is present, without any evidence of aortic stenosis. Aortic valve mean gradient measures 2.0 mmHg.  Aortic valve peak gradient measures 4.8 mmHg. Aortic valve area, by VTI measures 2.19 cm. Pulmonic Valve: The pulmonic valve was normal in structure. Pulmonic valve regurgitation is not visualized. No evidence of pulmonic stenosis. Aorta: The aortic root is normal in size and structure. There is borderline dilatation of the aortic root, measuring 37 mm. Venous: The inferior vena cava is normal in size with greater than 50% respiratory variability, suggesting right atrial pressure of 3 mmHg. IAS/Shunts: No atrial level shunt detected by color flow Doppler. Additional Comments: A device lead is visualized.  LEFT VENTRICLE PLAX 2D LVIDd:         4.70 cm   Diastology LVIDs:         3.46 cm   LV e' medial:    7.29 cm/s LV PW:         1.09 cm   LV E/e' medial:  20.3 LV IVS:        0.89 cm   LV e' lateral:   12.40 cm/s LVOT diam:     2.40 cm   LV E/e' lateral: 11.9 LV SV:         32 LV SV Index:   16 LVOT Area:     4.52 cm  LEFT ATRIUM             Index LA diam:        5.50 cm 2.75 cm/m LA Vol (A2C):   66.1 ml 33.06 ml/m LA Vol (A4C):   41.0 ml 20.50 ml/m LA Biplane Vol: 52.1 ml 26.06 ml/m  AORTIC VALVE                    PULMONIC VALVE AV Area (Vmax):    2.14 cm     PV Vmax:       0.67 m/s AV Area (Vmean):   2.22 cm     PV Vmean:      49.300 cm/s AV Area (VTI):     2.19 cm     PV VTI:        0.094 m AV Vmax:           110.00 cm/s  PV Peak grad:  1.8 mmHg AV Vmean:          65.100 cm/s  PV Mean  grad:  1.0 mmHg AV VTI:            0.144 m AV Peak Grad:      4.8 mmHg AV Mean Grad:      2.0 mmHg LVOT Vmax:         52.00 cm/s LVOT Vmean:        31.900 cm/s LVOT VTI:          0.070 m LVOT/AV  VTI ratio: 0.48  AORTA Ao Root diam: 3.70 cm MITRAL VALVE MV Area (PHT): 5.58 cm     SHUNTS MV Area VTI:   1.06 cm     Systemic VTI:  0.07 m MV Peak grad:  11.8 mmHg    Systemic Diam: 2.40 cm MV Mean grad:  4.0 mmHg MV Vmax:       1.72 m/s MV Vmean:      89.6 cm/s MV Decel Time: 136 msec MV E velocity: 148.00 cm/s MV A velocity: 51.40 cm/s MV E/A ratio:  2.88 [name] MD Electronically signed by [name] MD Signature Date/Time: 10/25/2021/5:46:45 PM    Final     Assessment and Plan:   CAISON HEARN is a 87 y.o. y/o male comes in today with a history of antibiotics given recently with worsening diarrhea despite having negative stool studies one week ago. The patient reports that he has some good days and some bad days.  Due to the intermittent nature of his diarrhea and he has been told to try and avoid dairy products and take Imodium as needed.  He has also been told that liquid Imodium is easier to titrate them pills and he should consider getting some liquid Imodium.  Due to his worsening diarrhea and antibiotic use despite having a normal stool study 1 week ago I will repeat those studies at the present time to see if anything is change.  The patient is not a candidate for any invasive procedure such as a colonoscopy to look for colitis with his history of a recent cardiac event and his ejection fraction of 20-25%.  The patient and his wife have been explained the plan and agree with it.    Lucilla Lame, MD. Marval Regal    Note: This dictation was prepared with Dragon dictation along with smaller phrase technology. Any transcriptional errors that result from this process are unintentional.

## 2021-11-16 ENCOUNTER — Other Ambulatory Visit: Payer: Self-pay | Admitting: Cardiovascular Disease

## 2021-11-16 ENCOUNTER — Telehealth: Payer: Medicare Other

## 2021-11-18 NOTE — Progress Notes (Signed)
Patient ID: Howard [name], male    DOB: December 31, 1930, 87 y.o.   MRN: [id number]  HPI  Mr Cassis is a 87 y/o male with a history of CHB (+ pacemaker), lung/ prostate cancer, CAD, DM, hyperlipidemia, HTN, bell's palsy, depression, GERD, sleep apnea, WPW, previous tobacco use and chronic heart failure.   Echo report from 10/25/21 reviewed and showed an EF of 20-25% along with mild/moderate MR.  RHC/LHC done 04/13/21 and showed: Prox RCA lesion is 30% stenosed. Dist RCA lesion is 50% stenosed. Prox LAD to Mid LAD lesion is 20% stenosed. Ost Cx to Prox Cx lesion is 30% stenosed. 1st Mrg lesion is 40% stenosed. 2nd Diag lesion is 60% stenosed.  Admitted 11/05/21 due to chest pain due to NSTEMI. Treated with heparin drip and then transitioned to eliquis. Cardiology consult obtained. Aldactone decreased due to soft BP's. Discharged after 4 days. Admitted 10/24/21 due to shortness of breath and cough. Completed radiation 4 months prior.  Cardiology and pulmonology consults obtained. CTA negative for PE. Treated with bipap and then weaned to baseline oxygen. Initially given IV lasix with transition to oral diuretics. Discharged after 4 days.   He presents today for his initial visit with a chief complaint of minimal fatigue upon moderate exertion. He describes this as having been present for several months. He has associated cough, pedal edema, difficulty sleeping (due to diarrhea) and copious diarrhea along with this. He denies dizziness, abdominal distention/ pain, palpitations, chest pain, shortness of breath or weight gain.   His biggest complaint if of this copious diarrhea that can occur suddenly without warning. Has recently submitted stool specimen to try and figure out the cause of this. Is unsure of how much fluid he is drinking.   Past Medical History:  Diagnosis Date   Arthritis    Bell palsy    Bell's palsy 04/12/2015   Cancer [facility])    prostate and skin   Chronic combined systolic and  diastolic CHF, NYHA class 1 ([place])    a. 07/2014 Echo: EF 35-40%, Gr 1 DD.   Complete heart block ([place])    a. 11/2010 s/p SJM 2210 [facility] DC PPM, ser# [id number].   Depression    Diabetes mellitus without complication ([place])    Fall 11-10-14   GERD (gastroesophageal reflux disease)    History of prostate cancer    Hyperlipidemia    Hypertension    LBBB (left bundle branch block)    Left-sided Bell's palsy    Lung cancer ([place]) 2016   NICM (nonischemic cardiomyopathy) ([place])    a. 07/2014 Echo: EF 35-40%, mid-apicalanteroseptal DK, Gr 1 DD, mild-mod dil LA.   Non-obstructive CAD    a. 07/2014 Abnl MV;  b. 08/2014 Cath: LM nl, LAD 30p, RI 40p, LCX nl, OM1 40, RCA dominant 30p, 70d-->Med Rx.   Poor balance    Presence of permanent cardiac pacemaker    Sleep apnea    a. cpap   Vertigo    WPW (Wolff-Parkinson-White syndrome)    a. S/P RFCA 1991.   Past Surgical History:  Procedure Laterality Date   ABDOMINAL AORTIC ENDOVASCULAR STENT GRAFT  08/25/2019   Procedure: ABDOMINAL AORTIC ENDOVASCULAR STENT GRAFT;  Surgeon: Algernon Huxley, MD;  Location: [facility] ORS;  Service: Vascular;;   ANGIOPLASTY Left 08/25/2019   Procedure: ANGIOPLASTY;  Surgeon: Algernon Huxley, MD;  Location: [facility] ORS;  Service: Vascular;  Laterality: Left;  left SFA and stent placement   APPLICATION OF WOUND VAC Left 06/07/2015  Procedure: APPLICATION OF WOUND VAC;  Surgeon: Robert Bellow, MD;  Location: [facility] ORS;  Service: General;  Laterality: Left;  left upper back   [place]  08/26/2014   Single vessel obstructive CAD   CARPAL TUNNEL RELEASE  04-04-15   [facility]   CATARACT EXTRACTION  07-31-11 and 09-18-11   Catheter ablation  1991   for WPW   cervical fusion     CHOLECYSTECTOMY  09-07-14   ENDARTERECTOMY FEMORAL Left 08/25/2019   Procedure: ENDARTERECTOMY FEMORAL;  Surgeon: Algernon Huxley, MD;  Location: [facility] ORS;  Service: Vascular;  Laterality: Left;  common and produndis    ENDOVASCULAR  REPAIR/STENT GRAFT Right 08/25/2019   Procedure: ENDOVASCULAR REPAIR/STENT GRAFT;  Surgeon: Algernon Huxley, MD;  Location: [facility] ORS;  Service: Vascular;  Laterality: Right;  renal artery   HAND SURGERY     right 1993; left 2005   [place] / REPLACE / REMOVE PACEMAKER     INSERTION OF ILIAC STENT Bilateral 08/25/2019   Procedure: INSERTION OF ILIAC STENT;  Surgeon: Algernon Huxley, MD;  Location: [facility] ORS;  Service: Vascular;  Laterality: Bilateral;   JOINT REPLACEMENT Left 2013   knee   JOINT REPLACEMENT Right 2004   knee   KNEE SURGERY     left knee 1991 and 1992; right knee [place] N/A 08/26/2014   Procedure: LEFT HEART CATHETERIZATION WITH CORONARY ANGIOGRAM;  Surgeon: [name], MD;  Location: [facility] CATH LAB;  Service: Cardiovascular;  Laterality: N/A;   LOWER EXTREMITY ANGIOGRAPHY Left 08/23/2019   Procedure: Lower Extremity Angiography;  Surgeon: Algernon Huxley, MD;  Location: [place] CV LAB;  Service: Cardiovascular;  Laterality: Left;   LUMBAR LAMINECTOMY/DECOMPRESSION MICRODISCECTOMY N/A 06/07/2014   Procedure: LUMBAR FOUR TO FIVE LUMBAR LAMINECTOMY/DECOMPRESSION MICRODISCECTOMY 1 LEVEL;  Surgeon: [name], MD;  Location: [place] NEURO ORS;  Service: Neurosurgery;  Laterality: N/A;   LUNG BIOPSY Right 2016   Dr [name]   MOHS SURGERY     PACEMAKER INSERTION     PPM-- [employer] [facility] 11/30/10 by [name]   PPM GENERATOR CHANGEOUT N/A 07/09/2019   Procedure: PPM GENERATOR CHANGEOUT;  Surgeon: Deboraha Sprang, MD;  Location: [place] CV LAB;  Service: Cardiovascular;  Laterality: N/A;   PROSTATE SURGERY     cancer--1998, prostatectomy   REPLACEMENT TOTAL KNEE     2004   RIGHT/LEFT HEART CATH AND CORONARY ANGIOGRAPHY Bilateral 04/13/2021   Procedure: RIGHT/LEFT HEART CATH AND CORONARY ANGIOGRAPHY;  Surgeon: Wellington Hampshire, MD;  Location: [place] CV LAB;  Service: Cardiovascular;  Laterality: Bilateral;   ruptured disc      1962 and 1998   TEE WITHOUT CARDIOVERSION N/A 09/01/2019   Procedure: TRANSESOPHAGEAL ECHOCARDIOGRAM (TEE);  Surgeon: [name], MD;  Location: [facility] ORS;  Service: Cardiovascular;  Laterality: N/A;   TEMPORARY PACEMAKER N/A 07/09/2019   Procedure: TEMPORARY PACEMAKER;  Surgeon: Deboraha Sprang, MD;  Location: [place] CV LAB;  Service: Cardiovascular;  Laterality: N/A;   TRIGGER FINGER RELEASE  01-24-15   WOUND DEBRIDEMENT Left 06/07/2015   Procedure: DEBRIDEMENT WOUND;  Surgeon: Robert Bellow, MD;  Location: [facility] ORS;  Service: General;  Laterality: Left;  left upper back   Family History  Problem Relation Age of Onset   Heart attack Mother    Hyperlipidemia Mother    CAD Other  Prostate cancer Neg Hx    Cancer Neg Hx    Social History   Tobacco Use   Smoking status: Former    Packs/day: 1.00    Years: 56.00    Pack years: 56.00    Types: Cigarettes    Quit date: 2011    Years since quitting: 11.9   Smokeless tobacco: Never  Substance Use Topics   Alcohol use: No   Allergies  Allergen Reactions   Sulfa Antibiotics Rash   Prior to Admission medications   Medication Sig Start Date End Date Taking? Authorizing Provider  acetaminophen (TYLENOL) 500 MG tablet Take 1,000 mg by mouth every 6 (six) hours as needed for moderate pain.   Yes [provider]  albuterol (VENTOLIN HFA) 108 (90 Base) MCG/ACT inhaler INHALE 2 PUFFS INTO LUNGS EVERY 6 HOURS AS NEEDED FOR WHEEZING OR SHORTNESS OF BREATH 07/23/21  Yes Tyler Pita, MD  aspirin EC 81 MG tablet Take 81 mg by mouth daily.   Yes [provider]  cetirizine (ZYRTEC) 10 MG tablet Take 10 mg by mouth daily.   Yes [provider]  Cholecalciferol (VITAMIN D3) 1000 UNITS CAPS Take 1,000 Units by mouth daily.   Yes [provider]  donepezil (ARICEPT) 5 MG tablet Take 1 tablet (5 mg total) by mouth daily. 10/23/21  Yes Jerrol Banana., MD  ELIQUIS 5 MG TABS tablet TAKE 1 TABLET  TWICE A DAY  (SWITCHED FROM PLAVIX) Patient taking differently: Take 5 mg by mouth 2 (two) times daily. 08/28/21  Yes Algernon Huxley, MD  ezetimibe (ZETIA) 10 MG tablet TAKE 1 TABLET DAILY 09/24/21  Yes Gollan, Kathlene November, MD  Fluticasone-Umeclidin-Vilant (TRELEGY ELLIPTA) 100-62.5-25 MCG/INH AEPB Inhale 1 puff into the lungs daily. 02/14/21  Yes Tyler Pita, MD  furosemide (LASIX) 20 MG tablet TAKE 1 TABLET BY MOUTH ONCE DAILY 10/09/21  Yes Gollan, Kathlene November, MD  Iron-Vitamin C (VITRON-C) 65-125 MG TABS Take 1 tablet by mouth daily. 04/16/21  Yes Earlie Server, MD  isosorbide mononitrate (IMDUR) 60 MG 24 hr tablet Take 1 tablet (60 mg total) by mouth daily. 11/10/21  Yes Elgergawy, Silver Huguenin, MD  megestrol (MEGACE) 40 MG tablet Take 1 tablet (40 mg total) by mouth 2 (two) times daily. 09/18/21  Yes Earlie Server, MD  metFORMIN (GLUCOPHAGE) 500 MG tablet Take 1 tablet (500 mg total) by mouth 2 (two) times daily with a meal. 10/23/21  Yes Jerrol Banana., MD  metoprolol succinate (TOPROL-XL) 25 MG 24 hr tablet TAKE 1 TABLET AT BEDTIME 09/24/21  Yes Gollan, Kathlene November, MD  montelukast (SINGULAIR) 10 MG tablet Take 10 mg by mouth daily as needed (sneezing/allergies.).   Yes [provider]  Multiple Vitamin (MULTIVITAMIN WITH MINERALS) TABS tablet Take 1 tablet by mouth daily. One-A-Day Multivitamin   Yes [provider]  nitroGLYCERIN (NITROSTAT) 0.4 MG SL tablet PLACE 1 TABLET UNDER TONGUE EVERY 5 MIN AS NEEDED FOR CHEST PAIN IF NO RELIEF IN15 MIN CALL 911 (MAX 2 TABS) 11/19/21  Yes Gollan, Kathlene November, MD  omeprazole (PRILOSEC) 40 MG capsule TAKE 1 CAPSULE TWICE DAILY AS NEEDED 02/12/21  Yes Chrismon, [name], PA-C  sacubitril-valsartan (ENTRESTO) 49-51 MG Take 1 tablet by mouth 2 (two) times daily. 07/25/21 11/22/21 Yes Gollan, Kathlene November, MD  sertraline (ZOLOFT) 50 MG tablet Take 1 tablet (50 mg total) by mouth daily. 10/23/21  Yes Jerrol Banana., MD  simvastatin (ZOCOR) 40 MG tablet TAKE  1 TABLET AT BEDTIME 03/23/21  Yes Gollan, Kathlene November, MD  SODIUM BICARBONATE PO Take 1 tablet by mouth 3 (three) times daily.   Yes [provider]  spironolactone (ALDACTONE) 25 MG tablet Take 0.5 tablets (12.5 mg total) by mouth daily. 11/09/21 11/04/22 Yes Elgergawy, Silver Huguenin, MD    Review of Systems  Constitutional:  Positive for fatigue (minimal). Negative for appetite change.  HENT:  Negative for congestion, postnasal drip and sore throat.   Eyes: Negative.   Respiratory:  Positive for cough (on occasion). Negative for chest tightness and shortness of breath.   Cardiovascular:  Positive for leg swelling. Negative for chest pain and palpitations.  Gastrointestinal:  Positive for diarrhea. Negative for abdominal distention, abdominal pain and nausea.  Endocrine: Negative.   Genitourinary: Negative.   Musculoskeletal:  Negative for back pain and neck pain.  Skin: Negative.   Allergic/Immunologic: Negative.   Neurological:  Negative for dizziness and light-headedness.  Hematological:  Negative for adenopathy. Does not bruise/bleed easily.  Psychiatric/Behavioral:  Positive for sleep disturbance (due to diarrhea). Negative for dysphoric mood. The patient is not nervous/anxious.    Vitals:   11/20/21 1222  BP: 127/71  Pulse: 72  Resp: 16  SpO2: 96%  Weight: 182 lb 8 oz (82.8 kg)  Height: 5\' 9"  (1.753 m)   Wt Readings from Last 3 Encounters:  11/20/21 182 lb 8 oz (82.8 kg)  11/14/21 186 lb 3.2 oz (84.5 kg)  11/09/21 179 lb 3.7 oz (81.3 kg)   Lab Results  Component Value Date   CREATININE 0.99 11/08/2021   CREATININE 1.10 11/07/2021   CREATININE 1.00 11/06/2021   Physical Exam Vitals and nursing note reviewed. Exam conducted with a chaperone present (wife).  Constitutional:      Appearance: Normal appearance.  HENT:     Head: Normocephalic and atraumatic.  Cardiovascular:     Rate and Rhythm: Normal rate and regular rhythm.  Pulmonary:     Effort: Pulmonary  effort is normal.     Breath sounds: Normal breath sounds.  Abdominal:     General: There is no distension.     Palpations: Abdomen is soft.  Musculoskeletal:        General: No tenderness.     Cervical back: Normal range of motion and neck supple.     Right lower leg: Edema (trace pitting) present.     Left lower leg: Edema (trace pitting) present.  Skin:    General: Skin is warm and dry.  Neurological:     General: No focal deficit present.     Mental Status: He is alert and oriented to person, place, and time.  Psychiatric:        Mood and Affect: Mood normal.        Behavior: Behavior normal.        Thought Content: Thought content normal.    Assessment & Plan:  1: Chronic heart failure with reduced ejection fraction- - NYHA class II - euvolemic today - weighing daily; reminded to call for an overnight weight gain of > 2 pounds or a weekly weight gain of > 5 pounds - not adding salt but does eat out frequently; encouraged to make low sodium choices when they eat out - saw cardiology [name]) 10/30/21 - palliative care visit done 11/13/21 - on GDMT of metoprolol, entresto and spironolactone - consider adding SGLT2 but hesitant to do so right now until diarrhea gets cleared up - BNP 10/24/21 was 4227.2  2: HTN- -  BP looks good (127/71) - saw PCP Rosanna Randy) 10/23/21 - BMP 11/08/21 reviewed and showed sodium 138, potassium 3.7, creatinine 0.99 and GFR >60  3: DM- - A1c 10/24/21 was 8.2%  4: Diarrhea- - saw GI Allen Norris) 11/14/21 - specimen for c-diff and GI profile dropped off yesterday - discussed doing lab work either today or next week when he sees PCP to make sure he's not getting dehydrated; patient prefers to wait until next week so will message PCP that he is seeing  5: COPD- - saw pulmonology Lanney Gins) 11/01/21 - has oxygen at home that he can wear if needed but he says that he checks his oxygen level and it stays in the 90's   Medication list reviewed.   Return  in 2 months or sooner for any questions/problems before then.

## 2021-11-19 ENCOUNTER — Other Ambulatory Visit: Payer: Medicare Other | Admitting: Student

## 2021-11-19 ENCOUNTER — Other Ambulatory Visit: Payer: Self-pay

## 2021-11-19 ENCOUNTER — Other Ambulatory Visit: Payer: Medicare Other

## 2021-11-19 DIAGNOSIS — R197 Diarrhea, unspecified: Secondary | ICD-10-CM | POA: Diagnosis not present

## 2021-11-19 DIAGNOSIS — E785 Hyperlipidemia, unspecified: Secondary | ICD-10-CM | POA: Diagnosis not present

## 2021-11-19 DIAGNOSIS — R0602 Shortness of breath: Secondary | ICD-10-CM

## 2021-11-19 DIAGNOSIS — I5023 Acute on chronic systolic (congestive) heart failure: Secondary | ICD-10-CM

## 2021-11-19 DIAGNOSIS — Z515 Encounter for palliative care: Secondary | ICD-10-CM | POA: Diagnosis not present

## 2021-11-19 DIAGNOSIS — R531 Weakness: Secondary | ICD-10-CM | POA: Diagnosis not present

## 2021-11-19 DIAGNOSIS — E119 Type 2 diabetes mellitus without complications: Secondary | ICD-10-CM | POA: Diagnosis not present

## 2021-11-19 DIAGNOSIS — C342 Malignant neoplasm of middle lobe, bronchus or lung: Secondary | ICD-10-CM | POA: Diagnosis not present

## 2021-11-19 DIAGNOSIS — C3491 Malignant neoplasm of unspecified part of right bronchus or lung: Secondary | ICD-10-CM | POA: Diagnosis not present

## 2021-11-19 DIAGNOSIS — J9621 Acute and chronic respiratory failure with hypoxia: Secondary | ICD-10-CM | POA: Diagnosis not present

## 2021-11-19 DIAGNOSIS — I110 Hypertensive heart disease with heart failure: Secondary | ICD-10-CM | POA: Diagnosis not present

## 2021-11-19 NOTE — Progress Notes (Signed)
[facility] Palliative Care Consult Note Telephone: [phone]  Fax: [phone]    Date of encounter: 11/19/21 12:21 PM PATIENT NAME: Howard [name] Howard [name] [id number]   [phone] (home)  DOB: 1931-11-07 MRN: [id number] PRIMARY CARE PROVIDER:    Jerrol Banana., MD,  [address] [place] [address] [phone]  REFERRING PROVIDER:   Jerrol Banana., MD [address] [place],  [place] [address] [phone]  RESPONSIBLE PARTY:    Contact Information     Name Relation Home Work Mobile   Barry Spouse [phone]  [phone]   Felice, Deem   [phone]         Due to the COVID-19 crisis, this visit was done via telemedicine from my office and it was initiated and consent by this patient.   This home visit was completed via telephone due to the patient's inability to connect via an audiovisual connection or their refusal to have an in-person visit. This connection was agreed to by the patient. I verified that I am speaking with the correct person using two identifiers.                                   ASSESSMENT AND PLAN / RECOMMENDATIONS:   Advance Care Planning/Goals of Care: Goals include to maximize quality of life and symptom management. Our advance care planning conversation included a discussion about:    The value and importance of advance care planning  Experiences with loved ones who have been seriously ill or have died  CODE STATUS: DNR  Symptom Management/Plan:  Diarrhea-patient still having episodes of diarrhea. He is currently taking imodium PRN. He has removed dairy from his diet. He is to follow up with GI as scheduled. GI profile and c-diff were negative during hospitalization. Wife has current specimens but unsure if they can be used as lab was closed for holiday. Specimens have been refrigerated. Palliative Nurse to follow up regarding  collection of specimens.  Generalized weakness-continue PT/OT as directed. Recommend using walker for ambulation.   Shortness of breath-secondary to COPD, HF. Dyspnea has improved; wearing oxygen continuously at night and prn during the day at 3 lpm. Maintain sats 92% or greater. Continue inhalers as directed.  Chronic diastolic and systolic HF- EF 44-96%-PRFFMBWG daily weights, furosemide, entresto, metoprolol, spironolactone as directed. Follow up with HF clinic as scheduled.   Malignant neoplasm of right lung-stage IIIb; status post SBRT. Recent CT shows post radiation changes; consistent with treatment response. Patient to have f/u chest CT every 3 months. F/u with oncology as scheduled.  Follow up Palliative Care Visit: Palliative care will continue to follow for complex medical decision making, advance care planning, and clarification of goals. Return in 4 weeks or prn.  I spent 25 minutes providing this consultation. More than 50% of the time in this consultation was spent in counseling and care coordination.   PPS: 40%  HOSPICE ELIGIBILITY/DIAGNOSIS: TBD  Chief Complaint: Palliative Medicine follow up visit.   HISTORY OF PRESENT ILLNESS:  Howard [name] is a 87 y.o. year old male  with combined diastolic and systolic heart failure, EF 20%, COPD, nonischemic cardiomyopathy, T2DM, dx right lung cancer in April 2022; s/p 35 radiation treatments. Patient hospitalized 11/14-11/18 due to chest pain, NSTEMI, chronic diastolic and systolic HF, CAD, chronic respiratory failure.    Patient is having loose stools;  3 a day. He is taking liquid imodium PRN. Had collected stool specimen, but the lab was closed due to holiday. She is awaiting call from GI to resubmit. Patient endorses weakness; therapy has just restarted. He does report his shortness of breath improved. He has been on room air during the day and wearing oxygen continuously at night at 3 lpm. Not weighing daily; denies worsening edema  to LE. Denies chest pain. Fair appetite reported. He has put dairy on hold due to diarrhea/loose stools.     History obtained from review of EMR, discussion with primary team, and interview with family, facility staff/caregiver and/or Mr. Kotarski.  I reviewed available labs, medications, imaging, studies and related documents from the EMR.  Records reviewed and summarized above.   ROS  General: NAD EYES: denies vision changes ENMT: denies dysphagia Cardiovascular: denies chest pain, denies DOE Pulmonary: denies cough, denies increased SOB Abdomen: endorses fair appetite, endorses loose stools/diarrhea  GU: denies dysuria MSK: + weakness,  no falls reported Skin: denies rashes or wounds Neurological: denies pain, denies insomnia Psych: Endorses stable mood Heme/lymph/immuno: denies bruises, abnormal bleeding  Physical Exam:  PE deferred due to this being a Tele health visit.     Thank you for the opportunity to participate in the care of Mr. Brauer.  The palliative care team will continue to follow. Please call our office at [phone] if we can be of additional assistance.   Ezekiel Slocumb, NP   COVID-19 PATIENT SCREENING TOOL Asked and negative response unless otherwise noted:   Have you had symptoms of covid, tested positive or been in contact with someone with symptoms/positive test in the past 5-10 days?  No

## 2021-11-19 NOTE — Progress Notes (Signed)
Patient with C-Diff PCR and GI Profile PCR ordered.  Specimens collected on last Thursday and has been in the refrigerator.  Family attempted drop off on last Friday but lab was closed.  Wife believes she will need new supplies to re-collect.  Advised that I would follow up with [facility] regarding specimen and supplies.  Spoke with Crystal at [facility] and specimens are still okay to be dropped off.  [facility] location closes at 5 and [facility] location closes at 4.   Phone call made to Mrs. Fariss to advise of above.  She stated [facility] was much closer and she will have specimens dropped off today.  She noted patient has a significant amount of diarrhea and they are hoping to find out answers regarding cause.  No other concerns or needs voiced at this time.   Due to the COVID-19 crisis, this visit was done via telemedicine from my office and it was initiated and consent by this patient and or family.  I connected with  Howard [name] OR PROXY on 11/19/21 by a video enabled telemedicine application and verified that I am speaking with the correct person using two identifiers.   I discussed the limitations of evaluation and management by telemedicine. The patient expressed understanding and agreed to proceed.

## 2021-11-20 ENCOUNTER — Other Ambulatory Visit: Payer: Self-pay

## 2021-11-20 ENCOUNTER — Encounter: Payer: Self-pay | Admitting: Family

## 2021-11-20 ENCOUNTER — Ambulatory Visit: Payer: Medicare Other | Attending: Family | Admitting: Family

## 2021-11-20 VITALS — BP 127/71 | HR 72 | Resp 16 | Ht 69.0 in | Wt 182.5 lb

## 2021-11-20 DIAGNOSIS — G510 Bell's palsy: Secondary | ICD-10-CM | POA: Diagnosis not present

## 2021-11-20 DIAGNOSIS — F32A Depression, unspecified: Secondary | ICD-10-CM | POA: Insufficient documentation

## 2021-11-20 DIAGNOSIS — Z950 Presence of cardiac pacemaker: Secondary | ICD-10-CM | POA: Diagnosis not present

## 2021-11-20 DIAGNOSIS — I5022 Chronic systolic (congestive) heart failure: Secondary | ICD-10-CM | POA: Diagnosis not present

## 2021-11-20 DIAGNOSIS — C3491 Malignant neoplasm of unspecified part of right bronchus or lung: Secondary | ICD-10-CM | POA: Diagnosis not present

## 2021-11-20 DIAGNOSIS — Z923 Personal history of irradiation: Secondary | ICD-10-CM | POA: Insufficient documentation

## 2021-11-20 DIAGNOSIS — I252 Old myocardial infarction: Secondary | ICD-10-CM | POA: Diagnosis not present

## 2021-11-20 DIAGNOSIS — G478 Other sleep disorders: Secondary | ICD-10-CM | POA: Diagnosis not present

## 2021-11-20 DIAGNOSIS — G4730 Sleep apnea, unspecified: Secondary | ICD-10-CM | POA: Insufficient documentation

## 2021-11-20 DIAGNOSIS — I5042 Chronic combined systolic (congestive) and diastolic (congestive) heart failure: Secondary | ICD-10-CM | POA: Diagnosis not present

## 2021-11-20 DIAGNOSIS — R197 Diarrhea, unspecified: Secondary | ICD-10-CM | POA: Diagnosis not present

## 2021-11-20 DIAGNOSIS — I10 Essential (primary) hypertension: Secondary | ICD-10-CM

## 2021-11-20 DIAGNOSIS — Z79899 Other long term (current) drug therapy: Secondary | ICD-10-CM | POA: Insufficient documentation

## 2021-11-20 DIAGNOSIS — Z87891 Personal history of nicotine dependence: Secondary | ICD-10-CM | POA: Insufficient documentation

## 2021-11-20 DIAGNOSIS — R609 Edema, unspecified: Secondary | ICD-10-CM | POA: Diagnosis not present

## 2021-11-20 DIAGNOSIS — Z85118 Personal history of other malignant neoplasm of bronchus and lung: Secondary | ICD-10-CM | POA: Diagnosis not present

## 2021-11-20 DIAGNOSIS — J9621 Acute and chronic respiratory failure with hypoxia: Secondary | ICD-10-CM | POA: Diagnosis not present

## 2021-11-20 DIAGNOSIS — Z7901 Long term (current) use of anticoagulants: Secondary | ICD-10-CM | POA: Diagnosis not present

## 2021-11-20 DIAGNOSIS — K219 Gastro-esophageal reflux disease without esophagitis: Secondary | ICD-10-CM | POA: Diagnosis not present

## 2021-11-20 DIAGNOSIS — I2510 Atherosclerotic heart disease of native coronary artery without angina pectoris: Secondary | ICD-10-CM | POA: Insufficient documentation

## 2021-11-20 DIAGNOSIS — E785 Hyperlipidemia, unspecified: Secondary | ICD-10-CM | POA: Insufficient documentation

## 2021-11-20 DIAGNOSIS — E119 Type 2 diabetes mellitus without complications: Secondary | ICD-10-CM | POA: Diagnosis not present

## 2021-11-20 DIAGNOSIS — Z8546 Personal history of malignant neoplasm of prostate: Secondary | ICD-10-CM | POA: Diagnosis not present

## 2021-11-20 DIAGNOSIS — I456 Pre-excitation syndrome: Secondary | ICD-10-CM | POA: Insufficient documentation

## 2021-11-20 DIAGNOSIS — J449 Chronic obstructive pulmonary disease, unspecified: Secondary | ICD-10-CM | POA: Diagnosis not present

## 2021-11-20 DIAGNOSIS — I5023 Acute on chronic systolic (congestive) heart failure: Secondary | ICD-10-CM | POA: Diagnosis not present

## 2021-11-20 DIAGNOSIS — I110 Hypertensive heart disease with heart failure: Secondary | ICD-10-CM | POA: Diagnosis not present

## 2021-11-20 NOTE — Patient Instructions (Signed)
Continue weighing daily and call for an overnight weight gain of > 2 pounds or a weekly weight gain of >5 pounds. 

## 2021-11-21 ENCOUNTER — Other Ambulatory Visit: Payer: Self-pay

## 2021-11-21 ENCOUNTER — Encounter: Payer: Self-pay | Admitting: Gastroenterology

## 2021-11-21 LAB — GI PROFILE, STOOL, PCR

## 2021-11-21 LAB — CLOSTRIDIUM DIFFICILE BY PCR: Toxigenic C. Difficile by PCR: NEGATIVE

## 2021-11-21 MED ORDER — ALOSETRON HCL 1 MG PO TABS: 1.0000 mg | ORAL_TABLET | Freq: Two times a day (BID) | ORAL | 3 refills | Status: DC

## 2021-11-22 ENCOUNTER — Other Ambulatory Visit: Payer: Self-pay

## 2021-11-22 MED ORDER — DIPHENOXYLATE-ATROPINE 2.5-0.025 MG PO TABS: ORAL_TABLET | ORAL | 3 refills | Status: AC

## 2021-11-23 ENCOUNTER — Ambulatory Visit (INDEPENDENT_AMBULATORY_CARE_PROVIDER_SITE_OTHER): Payer: Medicare Other

## 2021-11-23 DIAGNOSIS — E119 Type 2 diabetes mellitus without complications: Secondary | ICD-10-CM | POA: Diagnosis not present

## 2021-11-23 DIAGNOSIS — C3491 Malignant neoplasm of unspecified part of right bronchus or lung: Secondary | ICD-10-CM | POA: Diagnosis not present

## 2021-11-23 DIAGNOSIS — I5023 Acute on chronic systolic (congestive) heart failure: Secondary | ICD-10-CM | POA: Diagnosis not present

## 2021-11-23 DIAGNOSIS — J9621 Acute and chronic respiratory failure with hypoxia: Secondary | ICD-10-CM | POA: Diagnosis not present

## 2021-11-23 DIAGNOSIS — Z9181 History of falling: Secondary | ICD-10-CM

## 2021-11-23 DIAGNOSIS — I509 Heart failure, unspecified: Secondary | ICD-10-CM

## 2021-11-23 DIAGNOSIS — E785 Hyperlipidemia, unspecified: Secondary | ICD-10-CM | POA: Diagnosis not present

## 2021-11-23 DIAGNOSIS — I110 Hypertensive heart disease with heart failure: Secondary | ICD-10-CM | POA: Diagnosis not present

## 2021-11-23 NOTE — Patient Instructions (Addendum)
Visit Information   Thank you for taking time to visit with me today. Please don't hesitate to contact me if I can be of assistance to you before our next scheduled telephone appointment.  Our next appointment is December 28, 2021.  Please call the care guide team at [phone] if you need to cancel or reschedule your appointment.    Following is a copy of your full care plan:  Patient Care Plan: RN Care Manager Plan of Care     Problem Identified: CHF, BP, Fall Risk      Long-Range Goal: Disease Progression Prevented or Minimized   Start Date: 11/23/2021  Expected End Date: 02/21/2022  Priority: High  Note:   Current Barriers:  Chronic Disease Management support and education needs related to CHF, BP management, DMII, and Fall Risk.  RNCM Clinical Goal(s):  Patient will demonstrate Improved adherence to prescribed treatment plan for CHF, BP management, DMII, and Fall Risk through collaboration with the provider, RN Care manager, provider, and care management team.   Interventions: 1:1 collaboration with primary care provider regarding development and update of comprehensive plan of care as evidenced by provider attestation and co-signature Inter-disciplinary care team collaboration (see longitudinal plan of care) Evaluation of current treatment plan related to  self management and patient's adherence to plan as established by provider   Heart Failure Interventions:  (Status:  New goal.) Long Term Goal Basic overview and discussion of pathophysiology of Heart Failure reviewed Provided education on low sodium diet Assessed need for readable accurate scales in home Advised patient to weigh each morning after emptying bladder Reviewed role of diuretics in prevention of fluid overload and management of heart failure Discussed established weight parameters. Instructed to notify provider for weight gain greater than 3 lbs overnight or 5 lbs within a week. Discussed instructions  regarding supplemental oxygen use. Advised to continue using at 3L/min as instructed.  Discussed s/sx of CHF exacerbation along with indications for seeking immediate medical attention.  Diabetes Interventions:  (Status:  New goal.) Long Term Goal Assessed patient's understanding of A1c goal: <7%  Lab Results  Component Value Date   HGBA1C 8.2 (H) 10/24/2021  Reviewed medications. Spouse expressed concerns regarding side effects of metformin. Reports being informed several times that metformin may be contributing to patient's diarrhea. Requesting to reconsider this prescription and change to a different medication if PCP agrees. Reviewed s/sx of hypoglycemia and hyperglycemia along with recommended interventions. Reports currently not monitoring blood glucose levels. Denies recent symptoms. Discussed nutritional intake and importance of complying with DM diet. Advised to read nutrition labels, monitor carbohydrate intake and avoid high quantities of concentrated sugars.  Discussed and offered referrals for available Diabetes education classes. Declined need for additional diabetes resources. Discussed importance of completing recommended DM preventive care. Reports completing foot care and monitoring skin integrity. Reports completing eye exams as recommended.    Falls Interventions:  (Status:  New goal.) Long Term Goal Reviewed medications and discussed potential side effects such as dizziness, lightheadedness and frequent urination. Provided information regarding safety and fall prevention. Reports currently using a rollator walker with all ambulation.  Discussed plan for physical therapy. Spouse confirmed start of home health services physical therapy services. Reports patient has tolerated sessions well and following recommended activity restrictions.  Discussed ability to perform ADL's and tasks in the home. Currently requires assistance with ADL's and IADL's. Spouse reports very good  support. Reports having aide assistance three times a week. Agreed to keep the care management team  updated if additional assistance is required.   Hypertension Interventions:  (Status:  New goal.) Long Term Goal Last practice recorded BP readings:  BP Readings from Last 3 Encounters:  11/20/21 127/71  11/14/21 127/74  11/09/21 114/68  Most recent eGFR/CrCl: No results found for: EGFR  No components found for: CRCL  Reviewed medications.   Provided information regarding established blood pressure parameters along with indications for notifying a provider. Encouraged to monitor and record readings. Reports Discussed compliance with recommended cardiac prudent diet. Encouraged to read nutrition labels and avoid highly processed foods when possible. Reviewed s/sx of heart attack, stroke and worsening symptoms that require immediate medical attention.  Patient Goals/Self-Care Activities: Take all medications as prescribed Attend all scheduled provider appointments Call pharmacy for medication refills 3-7 days in advance of running out of medications Call provider office for new concerns or questions   Follow Up Plan:   Will follow up within the next month     Consent to CCM Services: Mr. Frith was given information about Chronic Care Management services including:  CCM service includes personalized support from designated clinical staff supervised by his physician, including individualized plan of care and coordination with other care providers 24/7 contact phone numbers for assistance for urgent and routine care needs. Service will only be billed when office clinical staff spend 20 minutes or more in a month to coordinate care. Only one practitioner may furnish and bill the service in a calendar month. The patient may stop CCM services at any time (effective at the end of the month) by phone call to the office staff. The patient will be responsible for cost sharing (co-pay) of up to  20% of the service fee (after annual deductible is met).  Patient agreed to services and verbal consent obtained.   Mr. Josem Kaufmann spouse/caregiver verbalized understanding of the information discussed during the telephonic outreach. Declined need for mailed/printed instructions. A member of the care management team will follow up next month.   [facility] Care Management [facility] [phone]

## 2021-11-23 NOTE — Progress Notes (Signed)
   He is felt to not be a candidate for cardiac rehab secondary to overall poor functional status, multiple comorbidities including but not limited to chronic hypoxic respiratory failure secondary to emphysema/small cell lung cancer, chronic combined systolic and diastolic CHF, moderate protein calorie malnutrition, and loose stools with incontinence of uncertain etiology, along with patient/family preference for conservative treatment only.

## 2021-11-23 NOTE — Chronic Care Management (AMB) (Signed)
Chronic Care Management   CCM RN Visit Note  11/23/2021 Name: Howard [name] MRN: [id number] DOB: [birth year]  Subjective: Howard [name] is a 87 y.o. year old male who is a primary care patient of Jerrol Banana., MD. The care management team was consulted for assistance with disease management and care coordination needs.    Engaged with patient and his spouse by telephone for initial visit in response to provider referral for case management and care coordination services.   Consent to Services:  The patient was given the following information about Chronic Care Management services: 1. CCM service includes personalized support from designated clinical staff supervised by the primary care provider, including individualized plan of care and coordination with other care providers 2. 24/7 contact phone numbers for assistance for urgent and routine care needs. 3. Service will only be billed when office clinical staff spend 20 minutes or more in a month to coordinate care. 4. Only one practitioner may furnish and bill the service in a calendar month. 5.The patient may stop CCM services at any time (effective at the end of the month) by phone call to the office staff. 6. The patient will be responsible for cost sharing (co-pay) of up to 20% of the service fee (after annual deductible is met). Patient agreed to services and consent obtained.  Assessment: Review of patient past medical history, allergies, medications, health status, including review of consultants reports, laboratory and other test data, was performed as part of comprehensive evaluation and provision of chronic care management services.   SDOH (Social Determinants of Health) assessments and interventions performed:  SDOH Interventions    Flowsheet Row Most Recent Value  SDOH Interventions   Food Insecurity Interventions Intervention Not Indicated  Transportation Interventions Intervention Not Indicated        CCM Care  Plan  Allergies  Allergen Reactions   Sulfa Antibiotics Rash    Outpatient Encounter Medications as of 11/23/2021  Medication Sig   albuterol (VENTOLIN HFA) 108 (90 Base) MCG/ACT inhaler INHALE 2 PUFFS INTO LUNGS EVERY 6 HOURS AS NEEDED FOR WHEEZING OR SHORTNESS OF BREATH   aspirin EC 81 MG tablet Take 81 mg by mouth daily.   cetirizine (ZYRTEC) 10 MG tablet Take 10 mg by mouth daily.   diphenoxylate-atropine (LOMOTIL) 2.5-0.025 MG tablet Take 1 to 2 tablet by mouth daily as needed for diarrhea   ELIQUIS 5 MG TABS tablet TAKE 1 TABLET TWICE A DAY  (SWITCHED FROM PLAVIX) (Patient taking differently: Take 5 mg by mouth 2 (two) times daily.)   furosemide (LASIX) 20 MG tablet TAKE 1 TABLET BY MOUTH ONCE DAILY   omeprazole (PRILOSEC) 40 MG capsule TAKE 1 CAPSULE TWICE DAILY AS NEEDED   spironolactone (ALDACTONE) 25 MG tablet Take 0.5 tablets (12.5 mg total) by mouth daily.   acetaminophen (TYLENOL) 500 MG tablet Take 1,000 mg by mouth every 6 (six) hours as needed for moderate pain.   Cholecalciferol (VITAMIN D3) 1000 UNITS CAPS Take 1,000 Units by mouth daily.   donepezil (ARICEPT) 5 MG tablet Take 1 tablet (5 mg total) by mouth daily.   ezetimibe (ZETIA) 10 MG tablet TAKE 1 TABLET DAILY   Fluticasone-Umeclidin-Vilant (TRELEGY ELLIPTA) 100-62.5-25 MCG/INH AEPB Inhale 1 puff into the lungs daily.   Iron-Vitamin C (VITRON-C) 65-125 MG TABS Take 1 tablet by mouth daily.   isosorbide mononitrate (IMDUR) 60 MG 24 hr tablet Take 1 tablet (60 mg total) by mouth daily.   megestrol (MEGACE) 40 MG tablet Take  1 tablet (40 mg total) by mouth 2 (two) times daily.   metFORMIN (GLUCOPHAGE) 500 MG tablet Take 1 tablet (500 mg total) by mouth 2 (two) times daily with a meal.   metoprolol succinate (TOPROL-XL) 25 MG 24 hr tablet TAKE 1 TABLET AT BEDTIME   montelukast (SINGULAIR) 10 MG tablet Take 10 mg by mouth daily as needed (sneezing/allergies.).   Multiple Vitamin (MULTIVITAMIN WITH MINERALS) TABS tablet  Take 1 tablet by mouth daily. One-A-Day Multivitamin   nitroGLYCERIN (NITROSTAT) 0.4 MG SL tablet PLACE 1 TABLET UNDER TONGUE EVERY 5 MIN AS NEEDED FOR CHEST PAIN IF NO RELIEF IN15 MIN CALL 911 (MAX 2 TABS)   sertraline (ZOLOFT) 50 MG tablet Take 1 tablet (50 mg total) by mouth daily.   simvastatin (ZOCOR) 40 MG tablet TAKE 1 TABLET AT BEDTIME   SODIUM BICARBONATE PO Take 1 tablet by mouth 3 (three) times daily.   No facility-administered encounter medications on file as of 11/23/2021.    Patient Active Problem List   Diagnosis Date Noted   NSTEMI (non-ST elevated myocardial infarction) ([place]) 11/05/2021   CKD stage 3 due to type 2 diabetes mellitus ([place]) 11/05/2021   Abnormal CXR    Pulmonary infiltrate in right lung on CXR    Hypoxia    Acute on chronic HFrEF (heart failure with reduced ejection fraction) ([place]) 10/24/2021   Acute respiratory failure (HCC)    Normocytic anemia 09/18/2021   Other fatigue 26/37/8588   Chronic systolic congestive heart failure ([place]) 07/18/2021   Goals of care, counseling/discussion 04/16/2021   Mediastinal lymphadenopathy 04/16/2021   Heart failure with reduced ejection fraction (HCC)    AAA (abdominal aortic aneurysm) without rupture 12/10/2019   Renal artery stenosis ([place]) 12/10/2019   PAD (peripheral artery disease) ([place]) 12/10/2019   Thromboembolism ([place]) 08/30/2019   Atherosclerosis of native arteries of extremity with intermittent claudication ([place]) 08/23/2019   Swelling of limb 08/17/2019   Pain in limb 08/17/2019   NICM (nonischemic cardiomyopathy) ([place]) 07/01/2019   CHF (congestive heart failure) ([place]) 07/01/2019   Malignant neoplasm of right lung ([place]) 04/18/2016   CAD (coronary artery disease) 04/01/2016   Adenocarcinoma ([place]) 04/01/2016   Skin cyst 12/26/2015   GERD (gastroesophageal reflux disease) 06/16/2015   Abscess of back 06/06/2015   Vertigo 03/27/2015   Carpal tunnel syndrome 10/28/2014   Status post cholecystectomy 09/29/2014    Disease of digestive tract 09/29/2014   Cardiomyopathy ([place]) 09/26/2014   Type 2 diabetes mellitus without complications ([place])    Sleep apnea    Spinal stenosis, lumbar region, with neurogenic claudication 06/07/2014   Lumbar stenosis with neurogenic claudication 06/07/2014   Abnormal gait 08/20/2012   H/O total knee replacement 08/20/2012   Arthritis of knee, degenerative 08/20/2012   Pacemaker-St.Jude 08/03/2012   Cardiac conduction disorder 06/19/2012   Acid reflux 06/18/2012   Nodal rhythm disorder 06/18/2012   Triggering of digit 03/25/2012   Hyperlipidemia 12/23/2011   Essential hypertension 03/23/2011   Atrioventricular block, complete ([place]) 03/23/2011   Complete atrioventricular block ([place]) 03/23/2011    Patient Care Plan: RN Care Manager Plan of Care     Problem Identified: CHF, BP, Fall Risk      Long-Range Goal: Disease Progression Prevented or Minimized   Start Date: 11/23/2021  Expected End Date: 02/21/2022  Priority: High  Note:   Current Barriers:  Chronic Disease Management support and education needs related to CHF, BP management, DMII, and Fall Risk.  RNCM Clinical Goal(s):  Patient will demonstrate Improved adherence  to prescribed treatment plan for CHF, BP management, DMII, and Fall Risk through collaboration with the provider, RN Care manager, provider, and care management team.   Interventions: 1:1 collaboration with primary care provider regarding development and update of comprehensive plan of care as evidenced by provider attestation and co-signature Inter-disciplinary care team collaboration (see longitudinal plan of care) Evaluation of current treatment plan related to  self management and patient's adherence to plan as established by provider   Heart Failure Interventions:  (Status:  New goal.) Long Term Goal Basic overview and discussion of pathophysiology of Heart Failure reviewed Provided education on low sodium diet Assessed need for  readable accurate scales in home Advised patient to weigh each morning after emptying bladder Reviewed role of diuretics in prevention of fluid overload and management of heart failure Discussed established weight parameters. Instructed to notify provider for weight gain greater than 3 lbs overnight or 5 lbs within a week. Discussed instructions regarding supplemental oxygen use. Advised to continue using at 3L/min as instructed.  Discussed s/sx of CHF exacerbation along with indications for seeking immediate medical attention.  Diabetes Interventions:  (Status:  New goal.) Long Term Goal Assessed patient's understanding of A1c goal: <7%  Lab Results  Component Value Date   HGBA1C 8.2 (H) 10/24/2021  Reviewed medications. Spouse expressed concerns regarding side effects of metformin. Reports being informed several times that metformin may be contributing to patient's diarrhea. Requesting to reconsider this prescription and change to a different medication if PCP agrees. Reviewed s/sx of hypoglycemia and hyperglycemia along with recommended interventions. Reports currently not monitoring blood glucose levels. Denies recent symptoms. Discussed nutritional intake and importance of complying with DM diet. Advised to read nutrition labels, monitor carbohydrate intake and avoid high quantities of concentrated sugars.  Discussed and offered referrals for available Diabetes education classes. Declined need for additional diabetes resources. Discussed importance of completing recommended DM preventive care. Reports completing foot care and monitoring skin integrity. Reports completing eye exams as recommended.    Falls Interventions:  (Status:  New goal.) Long Term Goal Reviewed medications and discussed potential side effects such as dizziness, lightheadedness and frequent urination. Provided information regarding safety and fall prevention. Reports currently using a rollator walker with all  ambulation.  Discussed plan for physical therapy. Spouse confirmed start of home health services physical therapy services. Reports patient has tolerated sessions well and following recommended activity restrictions.  Discussed ability to perform ADL's and tasks in the home. Currently requires assistance with ADL's and IADL's. Spouse reports very good support. Reports having aide assistance three times a week. Agreed to keep the care management team updated if additional assistance is required.   Hypertension Interventions:  (Status:  New goal.) Long Term Goal Last practice recorded BP readings:  BP Readings from Last 3 Encounters:  11/20/21 127/71  11/14/21 127/74  11/09/21 114/68  Most recent eGFR/CrCl: No results found for: EGFR  No components found for: CRCL  Reviewed medications.   Provided information regarding established blood pressure parameters along with indications for notifying a provider. Encouraged to monitor and record readings. Reports Discussed compliance with recommended cardiac prudent diet. Encouraged to read nutrition labels and avoid highly processed foods when possible. Reviewed s/sx of heart attack, stroke and worsening symptoms that require immediate medical attention.  Patient Goals/Self-Care Activities: Take all medications as prescribed Attend all scheduled provider appointments Call pharmacy for medication refills 3-7 days in advance of running out of medications Call provider office for new concerns or questions  Follow Up Plan:   Will follow up within the next month       PLAN A member of the care management team will follow up next month.   [facility] Care Management [facility] [phone]

## 2021-11-26 ENCOUNTER — Encounter: Payer: Medicare Other | Admitting: Physical Therapy

## 2021-11-26 ENCOUNTER — Telehealth: Payer: Self-pay | Admitting: Family Medicine

## 2021-11-26 NOTE — Telephone Encounter (Signed)
Home Health Verbal Orders - Caller/Agency: [facility] Callback Number: [phone] opt 2 Requesting OT Frequency: Evaluation

## 2021-11-27 ENCOUNTER — Telehealth: Payer: Self-pay | Admitting: Family Medicine

## 2021-11-27 ENCOUNTER — Inpatient Hospital Stay: Payer: Medicare Other | Admitting: Family Medicine

## 2021-11-27 ENCOUNTER — Ambulatory Visit: Payer: Medicare Other | Admitting: Pulmonary Disease

## 2021-11-27 DIAGNOSIS — E119 Type 2 diabetes mellitus without complications: Secondary | ICD-10-CM | POA: Diagnosis not present

## 2021-11-27 DIAGNOSIS — E785 Hyperlipidemia, unspecified: Secondary | ICD-10-CM | POA: Diagnosis not present

## 2021-11-27 DIAGNOSIS — C3491 Malignant neoplasm of unspecified part of right bronchus or lung: Secondary | ICD-10-CM | POA: Diagnosis not present

## 2021-11-27 DIAGNOSIS — I110 Hypertensive heart disease with heart failure: Secondary | ICD-10-CM | POA: Diagnosis not present

## 2021-11-27 DIAGNOSIS — J9621 Acute and chronic respiratory failure with hypoxia: Secondary | ICD-10-CM | POA: Diagnosis not present

## 2021-11-27 DIAGNOSIS — I5023 Acute on chronic systolic (congestive) heart failure: Secondary | ICD-10-CM | POA: Diagnosis not present

## 2021-11-27 NOTE — Telephone Encounter (Signed)
Left detailed voicemail approving verbal orders per Dr. Jacinto Reap.

## 2021-11-27 NOTE — Telephone Encounter (Signed)
Ester from [facility] calling in to advise PCP that Patient's wife declined OT Evaluation, at their recent visit. Please advise [phone]

## 2021-11-27 NOTE — Telephone Encounter (Signed)
OK for verbals 

## 2021-11-28 ENCOUNTER — Telehealth: Payer: Self-pay | Admitting: Family Medicine

## 2021-11-28 DIAGNOSIS — I428 Other cardiomyopathies: Secondary | ICD-10-CM | POA: Diagnosis not present

## 2021-11-28 DIAGNOSIS — F32A Depression, unspecified: Secondary | ICD-10-CM | POA: Diagnosis not present

## 2021-11-28 DIAGNOSIS — I2510 Atherosclerotic heart disease of native coronary artery without angina pectoris: Secondary | ICD-10-CM | POA: Diagnosis not present

## 2021-11-28 DIAGNOSIS — G4733 Obstructive sleep apnea (adult) (pediatric): Secondary | ICD-10-CM | POA: Diagnosis not present

## 2021-11-28 DIAGNOSIS — R0600 Dyspnea, unspecified: Secondary | ICD-10-CM | POA: Diagnosis not present

## 2021-11-28 DIAGNOSIS — J439 Emphysema, unspecified: Secondary | ICD-10-CM | POA: Diagnosis not present

## 2021-11-28 DIAGNOSIS — Z7901 Long term (current) use of anticoagulants: Secondary | ICD-10-CM | POA: Diagnosis not present

## 2021-11-28 DIAGNOSIS — I5023 Acute on chronic systolic (congestive) heart failure: Secondary | ICD-10-CM | POA: Diagnosis not present

## 2021-11-28 DIAGNOSIS — Z7982 Long term (current) use of aspirin: Secondary | ICD-10-CM | POA: Diagnosis not present

## 2021-11-28 DIAGNOSIS — J9621 Acute and chronic respiratory failure with hypoxia: Secondary | ICD-10-CM | POA: Diagnosis not present

## 2021-11-28 DIAGNOSIS — C3431 Malignant neoplasm of lower lobe, right bronchus or lung: Secondary | ICD-10-CM | POA: Diagnosis not present

## 2021-11-28 DIAGNOSIS — E1122 Type 2 diabetes mellitus with diabetic chronic kidney disease: Secondary | ICD-10-CM | POA: Diagnosis not present

## 2021-11-28 DIAGNOSIS — F0393 Unspecified dementia, unspecified severity, with mood disturbance: Secondary | ICD-10-CM | POA: Diagnosis not present

## 2021-11-28 DIAGNOSIS — M1990 Unspecified osteoarthritis, unspecified site: Secondary | ICD-10-CM | POA: Diagnosis not present

## 2021-11-28 DIAGNOSIS — N183 Chronic kidney disease, stage 3 unspecified: Secondary | ICD-10-CM | POA: Diagnosis not present

## 2021-11-28 DIAGNOSIS — I447 Left bundle-branch block, unspecified: Secondary | ICD-10-CM | POA: Diagnosis not present

## 2021-11-28 DIAGNOSIS — I442 Atrioventricular block, complete: Secondary | ICD-10-CM | POA: Diagnosis not present

## 2021-11-28 DIAGNOSIS — R159 Full incontinence of feces: Secondary | ICD-10-CM | POA: Diagnosis not present

## 2021-11-28 DIAGNOSIS — I214 Non-ST elevation (NSTEMI) myocardial infarction: Secondary | ICD-10-CM | POA: Diagnosis not present

## 2021-11-28 DIAGNOSIS — I130 Hypertensive heart and chronic kidney disease with heart failure and stage 1 through stage 4 chronic kidney disease, or unspecified chronic kidney disease: Secondary | ICD-10-CM | POA: Diagnosis not present

## 2021-11-28 DIAGNOSIS — Z7984 Long term (current) use of oral hypoglycemic drugs: Secondary | ICD-10-CM | POA: Diagnosis not present

## 2021-11-28 DIAGNOSIS — Z9981 Dependence on supplemental oxygen: Secondary | ICD-10-CM | POA: Diagnosis not present

## 2021-11-28 DIAGNOSIS — K219 Gastro-esophageal reflux disease without esophagitis: Secondary | ICD-10-CM | POA: Diagnosis not present

## 2021-11-28 DIAGNOSIS — G4730 Sleep apnea, unspecified: Secondary | ICD-10-CM | POA: Diagnosis not present

## 2021-11-28 DIAGNOSIS — Z9181 History of falling: Secondary | ICD-10-CM | POA: Diagnosis not present

## 2021-11-28 DIAGNOSIS — E785 Hyperlipidemia, unspecified: Secondary | ICD-10-CM | POA: Diagnosis not present

## 2021-11-28 DIAGNOSIS — Z7951 Long term (current) use of inhaled steroids: Secondary | ICD-10-CM | POA: Diagnosis not present

## 2021-11-28 NOTE — Telephone Encounter (Signed)
Home Health Verbal Orders - Caller/Agency: [facility]  Callback Number: [phone]  option 2  Requesting   PT:  Frequency:  1 wk 1 2 wk 5 1 wk 1

## 2021-11-28 NOTE — Telephone Encounter (Signed)
Please advise verbal orders?

## 2021-11-28 NOTE — Telephone Encounter (Signed)
Verbal okay was given. 

## 2021-11-29 ENCOUNTER — Encounter: Payer: Medicare Other | Admitting: Physical Therapy

## 2021-11-30 ENCOUNTER — Encounter (INDEPENDENT_AMBULATORY_CARE_PROVIDER_SITE_OTHER): Payer: Medicare Other | Admitting: Family Medicine

## 2021-11-30 ENCOUNTER — Ambulatory Visit: Payer: Medicare Other

## 2021-11-30 DIAGNOSIS — J9621 Acute and chronic respiratory failure with hypoxia: Secondary | ICD-10-CM | POA: Diagnosis not present

## 2021-11-30 DIAGNOSIS — E1122 Type 2 diabetes mellitus with diabetic chronic kidney disease: Secondary | ICD-10-CM | POA: Diagnosis not present

## 2021-11-30 DIAGNOSIS — G4730 Sleep apnea, unspecified: Secondary | ICD-10-CM

## 2021-11-30 DIAGNOSIS — I5023 Acute on chronic systolic (congestive) heart failure: Secondary | ICD-10-CM

## 2021-11-30 DIAGNOSIS — I2510 Atherosclerotic heart disease of native coronary artery without angina pectoris: Secondary | ICD-10-CM

## 2021-11-30 DIAGNOSIS — I214 Non-ST elevation (NSTEMI) myocardial infarction: Secondary | ICD-10-CM

## 2021-11-30 DIAGNOSIS — E755 Other lipid storage disorders: Secondary | ICD-10-CM

## 2021-11-30 DIAGNOSIS — I509 Heart failure, unspecified: Secondary | ICD-10-CM

## 2021-11-30 DIAGNOSIS — C3431 Malignant neoplasm of lower lobe, right bronchus or lung: Secondary | ICD-10-CM

## 2021-11-30 DIAGNOSIS — I130 Hypertensive heart and chronic kidney disease with heart failure and stage 1 through stage 4 chronic kidney disease, or unspecified chronic kidney disease: Secondary | ICD-10-CM | POA: Diagnosis not present

## 2021-11-30 DIAGNOSIS — N183 Chronic kidney disease, stage 3 unspecified: Secondary | ICD-10-CM | POA: Diagnosis not present

## 2021-11-30 DIAGNOSIS — R197 Diarrhea, unspecified: Secondary | ICD-10-CM

## 2021-11-30 NOTE — Chronic Care Management (AMB) (Signed)
Chronic Care Management   CCM RN Visit Note  11/30/2021 Name: Howard [name] MRN: [id number] DOB: [birth year]  Subjective: Howard [name] is a 87 y.o. year old male who is a primary care patient of Jerrol Banana., MD. The care management team was consulted for assistance with disease management and care coordination needs.    Engaged with patient's spouse/caregiver by telephone for follow up visit in response to provider referral for case management and care coordination services.   Consent to Services:  The patient was given information about Chronic Care Management services, agreed to services, and gave verbal consent prior to initiation of services.  Please see initial visit note for detailed documentation.   Assessment: Review of patient past medical history, allergies, medications, health status, including review of consultants reports, laboratory and other test data, was performed as part of comprehensive evaluation and provision of chronic care management services.   SDOH (Social Determinants of Health) assessments and interventions performed:No    CCM Care Plan  Allergies  Allergen Reactions   Sulfa Antibiotics Rash    Outpatient Encounter Medications as of 11/30/2021  Medication Sig Note   diphenoxylate-atropine (LOMOTIL) 2.5-0.025 MG tablet Take 1 to 2 tablet by mouth daily as needed for diarrhea 11/30/2021: Reports currently taking 3 times a day as advised by the GI team.   acetaminophen (TYLENOL) 500 MG tablet Take 1,000 mg by mouth every 6 (six) hours as needed for moderate pain.    albuterol (VENTOLIN HFA) 108 (90 Base) MCG/ACT inhaler INHALE 2 PUFFS INTO LUNGS EVERY 6 HOURS AS NEEDED FOR WHEEZING OR SHORTNESS OF BREATH    aspirin EC 81 MG tablet Take 81 mg by mouth daily.    cetirizine (ZYRTEC) 10 MG tablet Take 10 mg by mouth daily.    Cholecalciferol (VITAMIN D3) 1000 UNITS CAPS Take 1,000 Units by mouth daily.    donepezil (ARICEPT) 5 MG tablet Take 1 tablet (5  mg total) by mouth daily.    ELIQUIS 5 MG TABS tablet TAKE 1 TABLET TWICE A DAY  (SWITCHED FROM PLAVIX) (Patient taking differently: Take 5 mg by mouth 2 (two) times daily.)    ezetimibe (ZETIA) 10 MG tablet TAKE 1 TABLET DAILY    Fluticasone-Umeclidin-Vilant (TRELEGY ELLIPTA) 100-62.5-25 MCG/INH AEPB Inhale 1 puff into the lungs daily.    furosemide (LASIX) 20 MG tablet TAKE 1 TABLET BY MOUTH ONCE DAILY    Iron-Vitamin C (VITRON-C) 65-125 MG TABS Take 1 tablet by mouth daily.    isosorbide mononitrate (IMDUR) 60 MG 24 hr tablet Take 1 tablet (60 mg total) by mouth daily.    megestrol (MEGACE) 40 MG tablet Take 1 tablet (40 mg total) by mouth 2 (two) times daily.    metFORMIN (GLUCOPHAGE) 500 MG tablet Take 1 tablet (500 mg total) by mouth 2 (two) times daily with a meal.    metoprolol succinate (TOPROL-XL) 25 MG 24 hr tablet TAKE 1 TABLET AT BEDTIME    montelukast (SINGULAIR) 10 MG tablet Take 10 mg by mouth daily as needed (sneezing/allergies.).    Multiple Vitamin (MULTIVITAMIN WITH MINERALS) TABS tablet Take 1 tablet by mouth daily. One-A-Day Multivitamin    nitroGLYCERIN (NITROSTAT) 0.4 MG SL tablet PLACE 1 TABLET UNDER TONGUE EVERY 5 MIN AS NEEDED FOR CHEST PAIN IF NO RELIEF IN15 MIN CALL 911 (MAX 2 TABS)    omeprazole (PRILOSEC) 40 MG capsule TAKE 1 CAPSULE TWICE DAILY AS NEEDED    sertraline (ZOLOFT) 50 MG tablet Take 1 tablet (50  mg total) by mouth daily.    simvastatin (ZOCOR) 40 MG tablet TAKE 1 TABLET AT BEDTIME    SODIUM BICARBONATE PO Take 1 tablet by mouth 3 (three) times daily.    spironolactone (ALDACTONE) 25 MG tablet Take 0.5 tablets (12.5 mg total) by mouth daily.    No facility-administered encounter medications on file as of 11/30/2021.    Patient Active Problem List   Diagnosis Date Noted   NSTEMI (non-ST elevated myocardial infarction) ([place]) 11/05/2021   CKD stage 3 due to type 2 diabetes mellitus ([place]) 11/05/2021   Abnormal CXR    Pulmonary infiltrate in right lung  on CXR    Hypoxia    Acute on chronic HFrEF (heart failure with reduced ejection fraction) ([place]) 10/24/2021   Acute respiratory failure (HCC)    Normocytic anemia 09/18/2021   Other fatigue 93/73/4287   Chronic systolic congestive heart failure ([place]) 07/18/2021   Goals of care, counseling/discussion 04/16/2021   Mediastinal lymphadenopathy 04/16/2021   Heart failure with reduced ejection fraction (HCC)    AAA (abdominal aortic aneurysm) without rupture 12/10/2019   Renal artery stenosis ([place]) 12/10/2019   PAD (peripheral artery disease) ([place]) 12/10/2019   Thromboembolism ([place]) 08/30/2019   Atherosclerosis of native arteries of extremity with intermittent claudication ([place]) 08/23/2019   Swelling of limb 08/17/2019   Pain in limb 08/17/2019   NICM (nonischemic cardiomyopathy) ([place]) 07/01/2019   CHF (congestive heart failure) ([place]) 07/01/2019   Malignant neoplasm of right lung ([place]) 04/18/2016   CAD (coronary artery disease) 04/01/2016   Adenocarcinoma ([place]) 04/01/2016   Skin cyst 12/26/2015   GERD (gastroesophageal reflux disease) 06/16/2015   Abscess of back 06/06/2015   Vertigo 03/27/2015   Carpal tunnel syndrome 10/28/2014   Status post cholecystectomy 09/29/2014   Disease of digestive tract 09/29/2014   Cardiomyopathy ([place]) 09/26/2014   Type 2 diabetes mellitus without complications ([place])    Sleep apnea    Spinal stenosis, lumbar region, with neurogenic claudication 06/07/2014   Lumbar stenosis with neurogenic claudication 06/07/2014   Abnormal gait 08/20/2012   H/O total knee replacement 08/20/2012   Arthritis of knee, degenerative 08/20/2012   Pacemaker-St.Jude 08/03/2012   Cardiac conduction disorder 06/19/2012   Acid reflux 06/18/2012   Nodal rhythm disorder 06/18/2012   Triggering of digit 03/25/2012   Hyperlipidemia 12/23/2011   Essential hypertension 03/23/2011   Atrioventricular block, complete ([place]) 03/23/2011   Complete atrioventricular block ([place]) 03/23/2011    Patient Care Plan: RN Care Manager Plan of Care     Problem Identified: CHF, BP, Fall Risk and Diarrhea      Long-Range Goal: Disease Progression Prevented or Minimized   Start Date: 11/23/2021  Expected End Date: 02/21/2022  Priority: High  Note:   Current Barriers:  Chronic Disease Management support and education needs related to CHF, BP management, DMII, Diarrhea and Fall Risk.  RNCM Clinical Goal(s):  Patient will demonstrate Improved adherence to prescribed treatment plan for CHF, BP management, DMII, Diarrhea and Fall Risk through collaboration with the provider, RN Care manager, provider, and care management team.   Interventions: 1:1 collaboration with primary care provider regarding development and update of comprehensive plan of care as evidenced by provider attestation and co-signature Inter-disciplinary care team collaboration (see longitudinal plan of care) Evaluation of current treatment plan related to  self management and patient's adherence to plan as established by provider   Heart Failure Interventions:  (Goal on track:  Yes.) Long Term Goal Basic overview and discussion of pathophysiology of Heart  Failure reviewed Reviewed importance of maintaining a low sodium diet Reviewed established weight parameters. Instructed to notify provider for weight gain greater than 3 lbs overnight or 5 lbs within a week. Reviewed plan regarding supplemental oxygen use. Per spouse, patient's breathing has significantly improved. Reports he only requires supplemental oxygen at night. Settings remain at 3 L/min. Reviewed s/sx of CHF exacerbation along with indications for seeking immediate medical attention.  Diabetes Interventions:  (Status:  Condition stable.  Not addressed this visit.) Long Term Goal Assessed patient's understanding of A1c goal: <7%  Lab Results  Component Value Date   HGBA1C 8.2 (H) 10/24/2021  Reviewed medications. Spouse expressed concerns regarding side  effects of metformin. Reports being informed several times that metformin may be contributing to patient's diarrhea. Requesting to reconsider this prescription and change to a different medication if PCP agrees. Reviewed s/sx of hypoglycemia and hyperglycemia along with recommended interventions. Reports currently not monitoring blood glucose levels. Denies recent symptoms. Discussed nutritional intake and importance of complying with DM diet. Advised to read nutrition labels, monitor carbohydrate intake and avoid high quantities of concentrated sugars.  Discussed and offered referrals for available Diabetes education classes. Declined need for additional diabetes resources. Discussed importance of completing recommended DM preventive care. Reports completing foot care and monitoring skin integrity. Reports completing eye exams as recommended.    Falls Interventions:  (Status:  New goal.) Long Term Goal Reviewed medications and discussed potential side effects such as dizziness, lightheadedness and frequent urination. Provided information regarding safety and fall prevention. Reports currently using a rollator walker with all ambulation.  Discussed plan for physical therapy. Spouse confirmed start of home health services physical therapy services. Reports patient has tolerated sessions well and following recommended activity restrictions.  Discussed ability to perform ADL's and tasks in the home. Currently requires assistance with ADL's and IADL's. Spouse reports very good support. Reports having aide assistance three times a week. Agreed to keep the care management team updated if additional assistance is required.   Hypertension Interventions:  (Status:  Condition stable.  Not addressed this visit.) Long Term Goal Last practice recorded BP readings:  BP Readings from Last 3 Encounters:  11/20/21 127/71  11/14/21 127/74  11/09/21 114/68  Most recent eGFR/CrCl: No results found for: EGFR  No  components found for: CRCL  Reviewed medications.   Provided information regarding established blood pressure parameters along with indications for notifying a provider. Encouraged to monitor and record readings. Reports Discussed compliance with recommended cardiac prudent diet. Encouraged to read nutrition labels and avoid highly processed foods when possible. Reviewed s/sx of heart attack, stroke and worsening symptoms that require immediate medical attention.  Diarrhea Discussed plan for management of diarrhea. Per patient's spouse, the episodes started following evaluation in the Emergency Room.  Discussed instructions from the Gastroenterology team to continue using lomotil. The medication was previously being administered as needed for loose stools, however the symptoms have not resolved. The GI team advised to administer 2 tablets of lomotil at scheduled intervals three times a day for a total of six tablets. Mr. Antrim will follow up with GI on 12/04/21. Mr. Josem Kaufmann is currently taking metformin for glycemic management. His spouse prefers that he discontinue/change this medication d/t frequent episodes of diarrhea. She will discuss this at his next clinic visit. Discussed importance of monitoring loose stools and ensuring patient maintains adequate hydration. Thoroughly reviewed indications for seeking immediate medical attention.    Patient Goals/Self-Care Activities: Take all medications as prescribed Attend all  scheduled provider appointments Call pharmacy for medication refills 3-7 days in advance of running out of medications Call provider office for new concerns or questions   Follow Up Plan:   Will follow up within the next month      PLAN A member of the care management team will follow up within the next month.   C[facility] Care Management [facility] [phone]

## 2021-12-03 ENCOUNTER — Encounter: Payer: Self-pay | Admitting: Family Medicine

## 2021-12-03 ENCOUNTER — Ambulatory Visit (INDEPENDENT_AMBULATORY_CARE_PROVIDER_SITE_OTHER): Payer: Medicare Other | Admitting: Family Medicine

## 2021-12-03 ENCOUNTER — Ambulatory Visit (INDEPENDENT_AMBULATORY_CARE_PROVIDER_SITE_OTHER): Payer: Medicare Other | Admitting: Cardiovascular Disease

## 2021-12-03 ENCOUNTER — Other Ambulatory Visit: Payer: Self-pay

## 2021-12-03 ENCOUNTER — Encounter: Payer: Self-pay | Admitting: Cardiovascular Disease

## 2021-12-03 VITALS — BP 106/50 | HR 83 | Ht 69.0 in | Wt 187.0 lb

## 2021-12-03 VITALS — BP 131/78 | HR 90 | Temp 97.7°F | Wt 187.0 lb

## 2021-12-03 DIAGNOSIS — I701 Atherosclerosis of renal artery: Secondary | ICD-10-CM

## 2021-12-03 DIAGNOSIS — I10 Essential (primary) hypertension: Secondary | ICD-10-CM

## 2021-12-03 DIAGNOSIS — E785 Hyperlipidemia, unspecified: Secondary | ICD-10-CM | POA: Diagnosis not present

## 2021-12-03 DIAGNOSIS — I5022 Chronic systolic (congestive) heart failure: Secondary | ICD-10-CM | POA: Diagnosis not present

## 2021-12-03 DIAGNOSIS — Z950 Presence of cardiac pacemaker: Secondary | ICD-10-CM | POA: Diagnosis not present

## 2021-12-03 DIAGNOSIS — R197 Diarrhea, unspecified: Secondary | ICD-10-CM | POA: Diagnosis not present

## 2021-12-03 DIAGNOSIS — I739 Peripheral vascular disease, unspecified: Secondary | ICD-10-CM

## 2021-12-03 DIAGNOSIS — I2510 Atherosclerotic heart disease of native coronary artery without angina pectoris: Secondary | ICD-10-CM | POA: Diagnosis not present

## 2021-12-03 DIAGNOSIS — I428 Other cardiomyopathies: Secondary | ICD-10-CM

## 2021-12-03 DIAGNOSIS — I5042 Chronic combined systolic (congestive) and diastolic (congestive) heart failure: Secondary | ICD-10-CM

## 2021-12-03 DIAGNOSIS — J449 Chronic obstructive pulmonary disease, unspecified: Secondary | ICD-10-CM | POA: Diagnosis not present

## 2021-12-03 DIAGNOSIS — R0600 Dyspnea, unspecified: Secondary | ICD-10-CM | POA: Diagnosis not present

## 2021-12-03 DIAGNOSIS — E119 Type 2 diabetes mellitus without complications: Secondary | ICD-10-CM | POA: Diagnosis not present

## 2021-12-03 MED ORDER — METFORMIN HCL 500 MG PO TABS
500.0000 mg | ORAL_TABLET | Freq: Two times a day (BID) | ORAL | 5 refills | Status: DC
Start: 2021-12-03 — End: 2021-12-08

## 2021-12-03 MED ORDER — RIFAXIMIN 550 MG PO TABS: 550.0000 mg | ORAL_TABLET | Freq: Three times a day (TID) | ORAL | 0 refills | Status: AC

## 2021-12-03 MED ORDER — GLIPIZIDE ER 5 MG PO TB24: 5.0000 mg | ORAL_TABLET | Freq: Every day | ORAL | 2 refills | Status: AC

## 2021-12-03 NOTE — Patient Instructions (Addendum)
Medication Instructions:  Stop zetia for now (don't throw away)  If you need a refill on your cardiac medications before your next appointment, please call your pharmacy.   Lab work: No new labs needed  Testing/Procedures: No new testing needed  Follow-Up: At [facility], you and your health needs are our priority.  As part of our continuing mission to provide you with exceptional heart care, we have created designated Provider Care Teams.  These Care Teams include your primary Cardiologist (physician) and Advanced Practice Providers (APPs -  Physician Assistants and Nurse Practitioners) who all work together to provide you with the care you need, when you need it.  You will need a follow up appointment in 6 months  Providers on your designated Care Team:   Murray Hodgkins, NP Christell Faith, PA-C Cadence Kathlen Mody, [place]  COVID-19 Vaccine Information can be found at: [email or web address].uk For questions related to vaccine distribution or appointments, please email [email or web address] or call [phone].

## 2021-12-03 NOTE — Patient Instructions (Signed)
Please review the attached list of medications and notify my office if there are any errors.   Please bring all of your medications to every appointment so we can make sure that our medication list is the same as yours.   Put metformin on hold for the time being and change to glipizide ER 5mg  once a day  Start taking samples of Xifaxan 550mg  one tablet three times a day for 10 days to see if it helps with diarrhea.

## 2021-12-03 NOTE — Progress Notes (Signed)
Established patient visit   Patient: Howard [name]   DOB: 02-17-1931   87 y.o. Male  MRN: [id number] Visit Date: 12/03/2021  Today's healthcare provider: Lelon Huh, MD   Chief Complaint  Patient presents with   Hospitalization Follow-up   Diarrhea   Subjective    Diarrhea  This is a new (Started over a month ago) problem. The problem occurs 2 to 4 times per day. The problem has been unchanged. The patient states that diarrhea awakens him from sleep. Pertinent negatives include no abdominal pain, chills, fever, headaches or vomiting. Treatments tried: Lomotil. The treatment provided no relief.  Had stool studies done in the hospital negative PCR stool profile, negative c.diff, negative norovirus. Was attributed to azithromycin given during hospitalization. Had o.v. with Dr. Allen Norris on 11/14/2021 and planned on doing colonoscopy if approved by cardiology. He was prescribed Lotronex which was too expensive, and lomotil which hasn't helped. Has cut on dairy products which hasn't helped.    Follow up Hospitalization  Patient was admitted to [facility] on 11/06/2021 and discharged on 11/09/2021. He was treated for NSTEMI and acute on chronic HF rEF. Treatment for this included conservative management, and medication optimization.  Imdur was increased to 60 mg daily, which gave significant relief with chest pain, continue with Toprol, aspirin and statin.. Telephone follow up was done on N/A He reports excellent compliance with treatment. He reports this condition is improved. He was seen at CHF clinic on 11/292/2022 He has follow up scheduled with Dr. [name] this afternoon. Had Met C by Dr. Lanney Gins done last week.  ----------------------------------------------------------------------------------------- -     Medications: Outpatient Medications Prior to Visit  Medication Sig   acetaminophen (TYLENOL) 500 MG tablet Take 1,000 mg by mouth every 6 (six) hours as needed for moderate  pain.   albuterol (VENTOLIN HFA) 108 (90 Base) MCG/ACT inhaler INHALE 2 PUFFS INTO LUNGS EVERY 6 HOURS AS NEEDED FOR WHEEZING OR SHORTNESS OF BREATH   aspirin EC 81 MG tablet Take 81 mg by mouth daily.   cetirizine (ZYRTEC) 10 MG tablet Take 10 mg by mouth daily.   Cholecalciferol (VITAMIN D3) 1000 UNITS CAPS Take 1,000 Units by mouth daily.   diphenoxylate-atropine (LOMOTIL) 2.5-0.025 MG tablet Take 1 to 2 tablet by mouth daily as needed for diarrhea   donepezil (ARICEPT) 5 MG tablet Take 1 tablet (5 mg total) by mouth daily.   ELIQUIS 5 MG TABS tablet TAKE 1 TABLET TWICE A DAY  (SWITCHED FROM PLAVIX) (Patient taking differently: Take 5 mg by mouth 2 (two) times daily.)   ezetimibe (ZETIA) 10 MG tablet TAKE 1 TABLET DAILY   Fluticasone-Umeclidin-Vilant (TRELEGY ELLIPTA) 100-62.5-25 MCG/INH AEPB Inhale 1 puff into the lungs daily.   furosemide (LASIX) 20 MG tablet TAKE 1 TABLET BY MOUTH ONCE DAILY   Iron-Vitamin C (VITRON-C) 65-125 MG TABS Take 1 tablet by mouth daily.   isosorbide mononitrate (IMDUR) 60 MG 24 hr tablet Take 1 tablet (60 mg total) by mouth daily.   megestrol (MEGACE) 40 MG tablet Take 1 tablet (40 mg total) by mouth 2 (two) times daily.   metFORMIN (GLUCOPHAGE) 500 MG tablet Take 1 tablet (500 mg total) by mouth 2 (two) times daily with a meal.   metoprolol succinate (TOPROL-XL) 25 MG 24 hr tablet TAKE 1 TABLET AT BEDTIME   montelukast (SINGULAIR) 10 MG tablet Take 10 mg by mouth daily as needed (sneezing/allergies.).   Multiple Vitamin (MULTIVITAMIN WITH MINERALS) TABS tablet Take 1 tablet  by mouth daily. One-A-Day Multivitamin   nitroGLYCERIN (NITROSTAT) 0.4 MG SL tablet PLACE 1 TABLET UNDER TONGUE EVERY 5 MIN AS NEEDED FOR CHEST PAIN IF NO RELIEF IN15 MIN CALL 911 (MAX 2 TABS)   omeprazole (PRILOSEC) 40 MG capsule TAKE 1 CAPSULE TWICE DAILY AS NEEDED   sertraline (ZOLOFT) 50 MG tablet Take 1 tablet (50 mg total) by mouth daily.   simvastatin (ZOCOR) 40 MG tablet TAKE 1 TABLET  AT BEDTIME   SODIUM BICARBONATE PO Take 1 tablet by mouth 3 (three) times daily.   spironolactone (ALDACTONE) 25 MG tablet Take 0.5 tablets (12.5 mg total) by mouth daily.   No facility-administered medications prior to visit.    Review of Systems  Constitutional: Negative.  Negative for chills and fever.  Respiratory: Negative.    Cardiovascular: Negative.   Gastrointestinal:  Positive for diarrhea. Negative for abdominal distention, abdominal pain, anal bleeding, blood in stool, constipation, nausea, rectal pain and vomiting.  Neurological:  Negative for dizziness, light-headedness and headaches.      Objective    BP 131/78 (BP Location: Right Arm, Patient Position: Sitting, Cuff Size: Large)   Pulse 90   Temp 97.7 F (36.5 C) (Oral)   Wt 187 lb (84.8 kg)   SpO2 96%   BMI 27.62 kg/m    Physical Exam   General: Appearance:     Well developed, well nourished male in no acute distress  Eyes:    PERRL, conjunctiva/corneas clear, EOM's intact       Lungs:     Clear to auscultation bilaterally, respirations unlabored  Heart:    Normal heart rate. Normal rhythm. No murmurs, rubs, or gallops.    MS:   All extremities are intact.    Neurologic:   Awake, alert, oriented x 3. No apparent focal neurological defect.         No results found for any visits on 12/03/21.  Assessment & Plan     1. Diarrhea, unspecified type Onset just prior to recent hospitalization for STEMI and CHF exacerbation, but much worse during hospitalization. Negative GI work up to date and unlikely to be approved for colonoscopy. Lomotil prescribed by Dr. Allen Norris has not been effective. Dr. Allen Norris did prescribed Lotronex which the patient was unable to fill due to cost.  We do have 10 days of samples of rifaximin (XIFAXAN) 550 MG TABS tablet; Take 1 tablet (550 mg total) by mouth 3 (three) times daily.  Dispense: 27 tablet; Refill: 0 which he was given.   2. Type 2 diabetes mellitus without complication, without  long-term current use of insulin ([place]) He is concerned about metformin causing diarrhea. Counseled that is unlikely that metformin caused the onset of diarrhea, but it may be aggravating it. Will temporarily change to glipiZIDE (GLUCOTROL XL) 5 MG 24 hr tablet; Take 1 tablet (5 mg total) by mouth daily with breakfast.  Dispense: 30 tablet; Refill: 2  Discussed changing back to metformin once diarrhea is controlled.   3. Chronic combined systolic and diastolic congestive heart failure (HCC) Stable since hospital discharge, has had follow up at CHF clinic and seeing Dr. [name] this afternoon.   Follow up Dr. Rosanna Randy 02-21-2022 as scheduled.      The entirety of the information documented in the History of Present Illness, Review of Systems and Physical Exam were personally obtained by me. Portions of this information were initially documented by the CMA and reviewed by me for thoroughness and accuracy.     Lelon Huh,  MD  [facility] [phone] (phone) [phone] (fax)  [place]

## 2021-12-03 NOTE — Progress Notes (Signed)
Cardiology Office Note  Date:  12/03/2021   ID:  [name], DOB 05/19/1931, MRN [id number]  PCP:  Jerrol Banana., MD   Chief Complaint  Patient presents with   Follow up [facility]     Was at [facility] on Nov. 14, 2022 with CHF. Patient having a lot of diarrhea and was seen today by Dr. Caryn Section, was given Xifaxan to see if this will help. Dr. Allen Norris saw him and suggested a colonoscopy, patient would need to have a cardiac clearance because of risk factors. Medications reviewed by the patient's wife verbally.     HPI:  Mr. Willcutt is a 87 year old-year-old gentleman with a hx of  DM II complete heart block, status post pacemaker in 2011,  nonischemic cardiomyopathy,  ejection fraction 30-35%   coronary artery disease, 70% distal RCA disease, 30 and 40% lesions in his ramus, LAD  admission to the hospital 09/05/2014  with acute cholecystitis, ileus.  Cardiac catheterization at that time for positive stress test . Echo 07/2019: EF 55 to 60% Ef 2022: 20 to 25% Lung malignancy, on chemotherapy He presents for follow-up of his CAD  Discharge from the hospital November 09, 2021 Had weakness following discharge from the hospital, severe diarrhea For non-STEMI treated with heparin -Aspirin, metoprolol, Zetia, statin/simvastatin, Imdur   in the hospital for non-STEMI October 28, 2021, troponin peak 1000 acute on chronic respiratory failure secondary to pneumonia as well as NSTEMI, Conservative management recommended, isosorbide up to 60  echo done on 10/25/2021 showed LVEF 20 to 25% with left ventricle global hypokinesis, grade 2 diastolic dysfunction  Still with frequent diarrhea, On lomotil Started on rifaximin  EKG personally reviewed by myself on todays visit Paced rhythm rate 83 bpm  Other past medical history reviewed   Diagnosis lung malignancy Seen by one of our providers April 2022 At that time reported worsening shortness of breath, cough Echocardiogram April 2022  ejection fraction 20 to 25% dilated IVC Catheterization performed mild to moderate three-vessel disease, nonischemic cardiomyopathy On catheterization was not felt to be volume overloaded Seen back in follow-up in clinic Apr 23, 2021 Has started chemotherapy Entresto increased to 49/51 twice daily Lasix 20 daily spironolactone 25 daily metoprolol 25 daily Seen back in clinic June 04, 2021 Completing radiation treatment He did not tolerate Farxiga  Echocardiogram  Ejection fraction 20 to 25% global hypokinesis LVH  hospital August 30, 2019 with abdominal pain, CT scan confirming splenic infarct, right renal artery malperfusion TEE with no evidence of left atrial thrombus or left atrial appendage thrombus Severe aortic atherosclerosis,  New pacer 06/2019 Pacer downloads reviewed: Stable  Sept 2020, vascular records reviewed with him in detail AAA, right renal artery stenosis, severe aortoiliac occlusive disease with left iliac occlusion and right iliac stenosis, left SFA stenosis, rest pain and pregangrenous changes to the left great toe 1. Left common femoral artery and profunda femoris artery endarterectomy with core matrix patch angioplasty 2. Placement of a 6 mm diameter by 15 cm length via bond stent left superficial femoral artery postdilated with a 5 mm diameter Lutonix drug-coated angioplasty balloon 3 Right renal artery stent placement with 7 mm diameter by 16 mm length lifestream stent---Placement of a 23 mm diameter 14 cm length Gore Excluder Endoprosthesis main body right with a 14 mm diameter by 12 cm length left contralateral limb ---Stent placement in the right external iliac artery with 10 mm diameter by 58 mm length lifestream stent for occlusive disease --Gore excluder limb extension with  a 12 mm diameter by 14 cm length limb to the distal right external iliac artery for occlusive disease --Viabahn stent placement to the left external iliac artery with 13 mm diameter by 10  cm length stent for occlusive disease ---Fogarty embolectomy left SFA  TEE 08/2019  . The left ventricle has low normal systolic function, with an ejection  fraction of 50-55%. The cavity size was normal. There is mildly increased  left ventricular wall thickness. Left ventricular diastolic Doppler  parameters are consistent with  indeterminate diastolic dysfunction. Indeterminate filling pressures No  evidence of left ventricular regional wall motion abnormalities.   2. The right ventricle has normal systolc function. There is no increase  in right ventricular wall thickness.   3. Left atrial size was moderately dilated.   4. No evidence of a thrombus present in the left atrium or left atrial  appendage.   5. Evidence of atrial level shunting detected by color flow Doppler, PFO  present, saline contrast bubble study positive for PFO, small number of  bubbles crossing at rest, unable to test with valsalva   6. Moderate plaque in the descending aorta and aortic arch.   Limited by history of back pain, back surgery x6  hospital November 2016 for abscess on his back, had surgery Nodules found in the right lung, biopsy documenting adenocarcinoma 1.3 cm, treated with radiation  Previous CT chest 03/2016 Decreasing size of pathology-proven adenocarcinoma of right upper lobe, with associated radiation treatment effects of the lung.   PMH:   has a past medical history of Arthritis, Bell palsy, Bell's palsy (04/12/2015), Cancer ([place]), Chronic combined systolic and diastolic CHF, NYHA class 1 ([place]), Complete heart block ([place]), Depression, Diabetes mellitus without complication ([place]), Fall (11-10-14), GERD (gastroesophageal reflux disease), History of prostate cancer, Hyperlipidemia, Hypertension, LBBB (left bundle branch block), Left-sided Bell's palsy, Lung cancer ([place]) (2016), NICM (nonischemic cardiomyopathy) ([place]), Non-obstructive CAD, Poor balance, Presence of permanent cardiac pacemaker,  Sleep apnea, Vertigo, and WPW (Wolff-Parkinson-White syndrome).  PSH:    Past Surgical History:  Procedure Laterality Date   ABDOMINAL AORTIC ENDOVASCULAR STENT GRAFT  08/25/2019   Procedure: ABDOMINAL AORTIC ENDOVASCULAR STENT GRAFT;  Surgeon: Algernon Huxley, MD;  Location: [facility] ORS;  Service: Vascular;;   ANGIOPLASTY Left 08/25/2019   Procedure: ANGIOPLASTY;  Surgeon: Algernon Huxley, MD;  Location: [facility] ORS;  Service: Vascular;  Laterality: Left;  left SFA and stent placement   APPLICATION OF WOUND VAC Left 06/07/2015   Procedure: APPLICATION OF WOUND VAC;  Surgeon: Robert Bellow, MD;  Location: [facility] ORS;  Service: General;  Laterality: Left;  left upper back   [facility]  08/26/2014   Single vessel obstructive CAD   CARPAL TUNNEL RELEASE  04-04-15   [facility]   CATARACT EXTRACTION  07-31-11 and 09-18-11   Catheter ablation  1991   for WPW   cervical fusion     CHOLECYSTECTOMY  09-07-14   ENDARTERECTOMY FEMORAL Left 08/25/2019   Procedure: ENDARTERECTOMY FEMORAL;  Surgeon: Algernon Huxley, MD;  Location: [facility] ORS;  Service: Vascular;  Laterality: Left;  common and produndis    ENDOVASCULAR REPAIR/STENT GRAFT Right 08/25/2019   Procedure: ENDOVASCULAR REPAIR/STENT GRAFT;  Surgeon: Algernon Huxley, MD;  Location: [facility] ORS;  Service: Vascular;  Laterality: Right;  renal artery   HAND SURGERY     right 1993; left 2005   [place] / REPLACE / REMOVE PACEMAKER  INSERTION OF ILIAC STENT Bilateral 08/25/2019   Procedure: INSERTION OF ILIAC STENT;  Surgeon: Algernon Huxley, MD;  Location: [facility] ORS;  Service: Vascular;  Laterality: Bilateral;   JOINT REPLACEMENT Left 2013   knee   JOINT REPLACEMENT Right 2004   knee   KNEE SURGERY     left knee 1991 and 1992; right knee 1995   LEFT HEART CATHETERIZATION WITH CORONARY ANGIOGRAM N/A 08/26/2014   Procedure: LEFT HEART CATHETERIZATION WITH CORONARY ANGIOGRAM;  Surgeon: [name], MD;  Location: [facility] CATH LAB;   Service: Cardiovascular;  Laterality: N/A;   LOWER EXTREMITY ANGIOGRAPHY Left 08/23/2019   Procedure: Lower Extremity Angiography;  Surgeon: Algernon Huxley, MD;  Location: [place] CV LAB;  Service: Cardiovascular;  Laterality: Left;   LUMBAR LAMINECTOMY/DECOMPRESSION MICRODISCECTOMY N/A 06/07/2014   Procedure: LUMBAR FOUR TO FIVE LUMBAR LAMINECTOMY/DECOMPRESSION MICRODISCECTOMY 1 LEVEL;  Surgeon: [name], MD;  Location: [place] NEURO ORS;  Service: Neurosurgery;  Laterality: N/A;   LUNG BIOPSY Right 2016   Dr [name]   MOHS SURGERY     PACEMAKER INSERTION     PPM-- [employer] 11/30/10 by [name]   PPM GENERATOR CHANGEOUT N/A 07/09/2019   Procedure: PPM GENERATOR CHANGEOUT;  Surgeon: Deboraha Sprang, MD;  Location: [place] CV LAB;  Service: Cardiovascular;  Laterality: N/A;   PROSTATE SURGERY     cancer--1998, prostatectomy   REPLACEMENT TOTAL KNEE     2004   RIGHT/LEFT HEART CATH AND CORONARY ANGIOGRAPHY Bilateral 04/13/2021   Procedure: RIGHT/LEFT HEART CATH AND CORONARY ANGIOGRAPHY;  Surgeon: Wellington Hampshire, MD;  Location: [place] CV LAB;  Service: Cardiovascular;  Laterality: Bilateral;   ruptured disc     1962 and 1998   TEE WITHOUT CARDIOVERSION N/A 09/01/2019   Procedure: TRANSESOPHAGEAL ECHOCARDIOGRAM (TEE);  Surgeon: [name], MD;  Location: [facility] ORS;  Service: Cardiovascular;  Laterality: N/A;   TEMPORARY PACEMAKER N/A 07/09/2019   Procedure: TEMPORARY PACEMAKER;  Surgeon: Deboraha Sprang, MD;  Location: [place] CV LAB;  Service: Cardiovascular;  Laterality: N/A;   TRIGGER FINGER RELEASE  01-24-15   WOUND DEBRIDEMENT Left 06/07/2015   Procedure: DEBRIDEMENT WOUND;  Surgeon: Robert Bellow, MD;  Location: [facility] ORS;  Service: General;  Laterality: Left;  left upper back    Current Outpatient Medications  Medication Sig Dispense Refill   acetaminophen (TYLENOL) 500 MG tablet Take 1,000 mg by mouth every 6 (six) hours as needed for moderate pain.     albuterol  (VENTOLIN HFA) 108 (90 Base) MCG/ACT inhaler INHALE 2 PUFFS INTO LUNGS EVERY 6 HOURS AS NEEDED FOR WHEEZING OR SHORTNESS OF BREATH 8.5 g 6   apixaban (ELIQUIS) 5 MG TABS tablet Take 5 mg by mouth 2 (two) times daily.     aspirin EC 81 MG tablet Take 81 mg by mouth daily.     cetirizine (ZYRTEC) 10 MG tablet Take 10 mg by mouth daily.     Cholecalciferol (VITAMIN D3) 1000 UNITS CAPS Take 1,000 Units by mouth daily.     diphenoxylate-atropine (LOMOTIL) 2.5-0.025 MG tablet Take 1 to 2 tablet by mouth daily as needed for diarrhea 120 tablet 3   donepezil (ARICEPT) 5 MG tablet Take 1 tablet (5 mg total) by mouth daily. 30 tablet 12   ezetimibe (ZETIA) 10 MG tablet TAKE 1 TABLET DAILY 90 tablet 0   Fluticasone-Umeclidin-Vilant (TRELEGY ELLIPTA) 100-62.5-25 MCG/INH AEPB Inhale 1 puff into the lungs daily. 180 each 3   furosemide (LASIX) 20 MG  tablet TAKE 1 TABLET BY MOUTH ONCE DAILY 30 tablet 5   glipiZIDE (GLUCOTROL XL) 5 MG 24 hr tablet Take 1 tablet (5 mg total) by mouth daily with breakfast. 30 tablet 2   Iron-Vitamin C (VITRON-C) 65-125 MG TABS Take 1 tablet by mouth daily. 30 tablet 2   isosorbide mononitrate (IMDUR) 60 MG 24 hr tablet Take 1 tablet (60 mg total) by mouth daily. 30 tablet 0   megestrol (MEGACE) 40 MG tablet Take 1 tablet (40 mg total) by mouth 2 (two) times daily. 60 tablet 2   metoprolol succinate (TOPROL-XL) 25 MG 24 hr tablet TAKE 1 TABLET AT BEDTIME 90 tablet 0   montelukast (SINGULAIR) 10 MG tablet Take 10 mg by mouth daily as needed (sneezing/allergies.).     Multiple Vitamin (MULTIVITAMIN WITH MINERALS) TABS tablet Take 1 tablet by mouth daily. One-A-Day Multivitamin     nitroGLYCERIN (NITROSTAT) 0.4 MG SL tablet PLACE 1 TABLET UNDER TONGUE EVERY 5 MIN AS NEEDED FOR CHEST PAIN IF NO RELIEF IN15 MIN CALL 911 (MAX 2 TABS) 30 tablet 0   omeprazole (PRILOSEC) 40 MG capsule TAKE 1 CAPSULE TWICE DAILY AS NEEDED 180 capsule 3   sertraline (ZOLOFT) 50 MG tablet Take 1 tablet (50 mg  total) by mouth daily. 30 tablet 3   simvastatin (ZOCOR) 40 MG tablet TAKE 1 TABLET AT BEDTIME 90 tablet 3   SODIUM BICARBONATE PO Take 1 tablet by mouth 3 (three) times daily.     spironolactone (ALDACTONE) 25 MG tablet Take 0.5 tablets (12.5 mg total) by mouth daily. 30 tablet 0   metFORMIN (GLUCOPHAGE) 500 MG tablet Take 1 tablet (500 mg total) by mouth 2 (two) times daily with a meal. (PUT ON HOLD 12/03/2021) (Patient not taking: Reported on 12/03/2021) 60 tablet 5   rifaximin (XIFAXAN) 550 MG TABS tablet Take 1 tablet (550 mg total) by mouth 3 (three) times daily. (Patient not taking: Reported on 12/03/2021) 27 tablet 0   No current facility-administered medications for this visit.     Allergies:   Sulfa antibiotics   Social History:  The patient  reports that he quit smoking about 11 years ago. His smoking use included cigarettes. He has a 56.00 pack-year smoking history. He has never used smokeless tobacco. He reports that he does not drink alcohol and does not use drugs.   Family History:   family history includes CAD in an other family member; Heart attack in his mother; Hyperlipidemia in his mother.    Review of Systems: Review of Systems  Constitutional:  Positive for malaise/fatigue.  HENT: Negative.    Respiratory: Negative.    Cardiovascular: Negative.   Gastrointestinal:  Positive for diarrhea.  Musculoskeletal:  Positive for back pain and joint pain.  Neurological: Negative.   Psychiatric/Behavioral: Negative.    All other systems reviewed and are negative.   PHYSICAL EXAM: VS:  BP (!) 106/50 (BP Location: Left Arm, Patient Position: Sitting, Cuff Size: Normal)   Pulse 83   Ht 5\' 9"  (1.753 m)   Wt 187 lb (84.8 kg)   SpO2 94%   BMI 27.62 kg/m  , BMI Body mass index is 27.62 kg/m. Constitutional:  oriented to person, place, and time. No distress.  Appears pale HENT:  Head: Grossly normal Eyes:  no discharge. No scleral icterus.  Neck: No JVD, no carotid  bruits  Cardiovascular: Regular rate and rhythm, no murmurs appreciated trace nonpitting lower extremity edema Pulmonary/Chest: Clear to auscultation bilaterally, no wheezes or rales Abdominal:  Soft.  no distension.  no tenderness.  Musculoskeletal: Normal range of motion Neurological:  normal muscle tone. Coordination normal. No atrophy Skin: Skin warm and dry Psychiatric: normal affect, pleasant   Recent Labs: 10/24/2021: B Natriuretic Peptide 4,227.2 11/07/2021: ALT 17; Magnesium 1.9 11/08/2021: BUN 25; Creatinine, Ser 0.99; Hemoglobin 10.2; Platelets 244; Potassium 3.7; Sodium 138    Lipid Panel Lab Results  Component Value Date   CHOL 130 11/13/2020   HDL 43 11/13/2020   LDLCALC 63 11/13/2020   TRIG 137 11/13/2020      Wt Readings from Last 3 Encounters:  12/03/21 187 lb (84.8 kg)  12/03/21 187 lb (84.8 kg)  11/20/21 182 lb 8 oz (82.8 kg)     ASSESSMENT AND PLAN:  Coronary artery disease involving native coronary artery of native heart without angina pectoris -  Recent non-STEMI in the setting of respiratory failure requiring BiPAP EF unchanged, denies anginal symptoms, decision made not to pursue ischemic work-up Denies anginal symptoms on today's visit  Complete heart block ([place]) - Plan: EKG 12-Lead S/p pacemaker, followed by EP  Acute respiratory failure CT scan suggesting pneumonia/pneumonitis/fibrosis At site of radiation Breathing stable, not particularly active  Essential hypertension - Plan: EKG 12-Lead Low blood pressure, no med changes at this time, continue to monitor blood pressure at home With weight loss, blood pressure may run lower  Mixed hyperlipidemia  On simvastatin Zetia 10 mg daily  Nonischemic cardiomyopathy (H[facility]) Ejection fraction 20 to 25% Did not tolerate Farxiga Blood pressure low limiting progression of heart failure therapy On Lasix daily, isosorbide 60 daily, metoprolol succinate 25 daily spironolactone 12.5 daily  Chronic  back pain In a wheelchair, numerous prior surgeries  Pacemaker-[employer]ude Followed by EP  COPD/cough  Reports chronic sputum production since XRT Otherwise stable  Chronic diarrhea Followed by GI, recommend he hold metformin, Zetia even consider a trial hold of omeprazole On Imodium  Malignant neoplasm of middle lobe of right lung ([place]) Completed radiation treatment Has difficult time remaining stable for chemotherapy   Long discussion concerning getting some of his medications more affordable  Total encounter time more than 25 minutes  Greater than 50% was spent in counseling and coordination of care with the patient    Orders Placed This Encounter  Procedures   EKG 12-Lead      Signed, Esmond Plants, M.D., Ph.D. 12/03/2021  [place], [place]

## 2021-12-03 NOTE — Patient Instructions (Signed)
Visit Information  Thank you for allowing the Chronic Care Management team to participate in your care. Please don't hesitate to contact me if I can be of assistance to you before our next  telephone appointment.    [facility] [phone]

## 2021-12-04 ENCOUNTER — Emergency Department: Payer: Medicare Other

## 2021-12-04 ENCOUNTER — Other Ambulatory Visit: Payer: Self-pay

## 2021-12-04 ENCOUNTER — Inpatient Hospital Stay: Payer: Medicare Other

## 2021-12-04 ENCOUNTER — Inpatient Hospital Stay
Admission: EM | Admit: 2021-12-04 | Discharge: 2021-12-08 | DRG: 291 | Disposition: A | Discharge status: Home Health | Payer: Medicare Other | Attending: Internal Medicine | Admitting: Internal Medicine

## 2021-12-04 DIAGNOSIS — J90 Pleural effusion, not elsewhere classified: Secondary | ICD-10-CM | POA: Diagnosis not present

## 2021-12-04 DIAGNOSIS — J9811 Atelectasis: Secondary | ICD-10-CM | POA: Diagnosis present

## 2021-12-04 DIAGNOSIS — Z96653 Presence of artificial knee joint, bilateral: Secondary | ICD-10-CM | POA: Diagnosis present

## 2021-12-04 DIAGNOSIS — J439 Emphysema, unspecified: Secondary | ICD-10-CM | POA: Diagnosis present

## 2021-12-04 DIAGNOSIS — D735 Infarction of spleen: Secondary | ICD-10-CM | POA: Diagnosis present

## 2021-12-04 DIAGNOSIS — G510 Bell's palsy: Secondary | ICD-10-CM | POA: Diagnosis present

## 2021-12-04 DIAGNOSIS — E440 Moderate protein-calorie malnutrition: Secondary | ICD-10-CM | POA: Diagnosis present

## 2021-12-04 DIAGNOSIS — I442 Atrioventricular block, complete: Secondary | ICD-10-CM | POA: Diagnosis present

## 2021-12-04 DIAGNOSIS — J9620 Acute and chronic respiratory failure, unspecified whether with hypoxia or hypercapnia: Secondary | ICD-10-CM | POA: Diagnosis present

## 2021-12-04 DIAGNOSIS — Z20822 Contact with and (suspected) exposure to covid-19: Secondary | ICD-10-CM | POA: Diagnosis present

## 2021-12-04 DIAGNOSIS — J9621 Acute and chronic respiratory failure with hypoxia: Secondary | ICD-10-CM | POA: Diagnosis present

## 2021-12-04 DIAGNOSIS — Z981 Arthrodesis status: Secondary | ICD-10-CM

## 2021-12-04 DIAGNOSIS — I248 Other forms of acute ischemic heart disease: Secondary | ICD-10-CM | POA: Diagnosis not present

## 2021-12-04 DIAGNOSIS — R069 Unspecified abnormalities of breathing: Secondary | ICD-10-CM | POA: Diagnosis not present

## 2021-12-04 DIAGNOSIS — Z923 Personal history of irradiation: Secondary | ICD-10-CM

## 2021-12-04 DIAGNOSIS — I252 Old myocardial infarction: Secondary | ICD-10-CM

## 2021-12-04 DIAGNOSIS — I700 Atherosclerosis of aorta: Secondary | ICD-10-CM | POA: Diagnosis not present

## 2021-12-04 DIAGNOSIS — K529 Noninfective gastroenteritis and colitis, unspecified: Secondary | ICD-10-CM | POA: Diagnosis present

## 2021-12-04 DIAGNOSIS — E46 Unspecified protein-calorie malnutrition: Secondary | ICD-10-CM | POA: Diagnosis present

## 2021-12-04 DIAGNOSIS — I130 Hypertensive heart and chronic kidney disease with heart failure and stage 1 through stage 4 chronic kidney disease, or unspecified chronic kidney disease: Secondary | ICD-10-CM | POA: Diagnosis present

## 2021-12-04 DIAGNOSIS — R0689 Other abnormalities of breathing: Secondary | ICD-10-CM | POA: Diagnosis not present

## 2021-12-04 DIAGNOSIS — R911 Solitary pulmonary nodule: Secondary | ICD-10-CM | POA: Diagnosis not present

## 2021-12-04 DIAGNOSIS — G4733 Obstructive sleep apnea (adult) (pediatric): Secondary | ICD-10-CM | POA: Diagnosis present

## 2021-12-04 DIAGNOSIS — I959 Hypotension, unspecified: Secondary | ICD-10-CM | POA: Diagnosis not present

## 2021-12-04 DIAGNOSIS — K219 Gastro-esophageal reflux disease without esophagitis: Secondary | ICD-10-CM | POA: Diagnosis present

## 2021-12-04 DIAGNOSIS — I255 Ischemic cardiomyopathy: Secondary | ICD-10-CM | POA: Diagnosis present

## 2021-12-04 DIAGNOSIS — I420 Dilated cardiomyopathy: Secondary | ICD-10-CM | POA: Diagnosis present

## 2021-12-04 DIAGNOSIS — R000 Tachycardia, unspecified: Secondary | ICD-10-CM | POA: Diagnosis not present

## 2021-12-04 DIAGNOSIS — Z85118 Personal history of other malignant neoplasm of bronchus and lung: Secondary | ICD-10-CM | POA: Diagnosis not present

## 2021-12-04 DIAGNOSIS — Z6824 Body mass index (BMI) 24.0-24.9, adult: Secondary | ICD-10-CM

## 2021-12-04 DIAGNOSIS — I110 Hypertensive heart disease with heart failure: Secondary | ICD-10-CM | POA: Diagnosis not present

## 2021-12-04 DIAGNOSIS — Z8249 Family history of ischemic heart disease and other diseases of the circulatory system: Secondary | ICD-10-CM

## 2021-12-04 DIAGNOSIS — I3139 Other pericardial effusion (noninflammatory): Secondary | ICD-10-CM | POA: Diagnosis not present

## 2021-12-04 DIAGNOSIS — R0789 Other chest pain: Secondary | ICD-10-CM | POA: Diagnosis not present

## 2021-12-04 DIAGNOSIS — E1122 Type 2 diabetes mellitus with diabetic chronic kidney disease: Secondary | ICD-10-CM | POA: Diagnosis not present

## 2021-12-04 DIAGNOSIS — Z950 Presence of cardiac pacemaker: Secondary | ICD-10-CM | POA: Diagnosis not present

## 2021-12-04 DIAGNOSIS — Z9981 Dependence on supplemental oxygen: Secondary | ICD-10-CM

## 2021-12-04 DIAGNOSIS — I509 Heart failure, unspecified: Secondary | ICD-10-CM

## 2021-12-04 DIAGNOSIS — I25118 Atherosclerotic heart disease of native coronary artery with other forms of angina pectoris: Secondary | ICD-10-CM | POA: Diagnosis not present

## 2021-12-04 DIAGNOSIS — N183 Chronic kidney disease, stage 3 unspecified: Secondary | ICD-10-CM | POA: Diagnosis not present

## 2021-12-04 DIAGNOSIS — F0393 Unspecified dementia, unspecified severity, with mood disturbance: Secondary | ICD-10-CM | POA: Diagnosis present

## 2021-12-04 DIAGNOSIS — Z8546 Personal history of malignant neoplasm of prostate: Secondary | ICD-10-CM

## 2021-12-04 DIAGNOSIS — Z66 Do not resuscitate: Secondary | ICD-10-CM | POA: Diagnosis present

## 2021-12-04 DIAGNOSIS — Z7901 Long term (current) use of anticoagulants: Secondary | ICD-10-CM

## 2021-12-04 DIAGNOSIS — I2510 Atherosclerotic heart disease of native coronary artery without angina pectoris: Secondary | ICD-10-CM | POA: Diagnosis present

## 2021-12-04 DIAGNOSIS — Z83438 Family history of other disorder of lipoprotein metabolism and other lipidemia: Secondary | ICD-10-CM

## 2021-12-04 DIAGNOSIS — J9622 Acute and chronic respiratory failure with hypercapnia: Secondary | ICD-10-CM | POA: Diagnosis not present

## 2021-12-04 DIAGNOSIS — Z85828 Personal history of other malignant neoplasm of skin: Secondary | ICD-10-CM

## 2021-12-04 DIAGNOSIS — E876 Hypokalemia: Secondary | ICD-10-CM | POA: Diagnosis present

## 2021-12-04 DIAGNOSIS — E785 Hyperlipidemia, unspecified: Secondary | ICD-10-CM | POA: Diagnosis present

## 2021-12-04 DIAGNOSIS — Z7982 Long term (current) use of aspirin: Secondary | ICD-10-CM

## 2021-12-04 DIAGNOSIS — Z9842 Cataract extraction status, left eye: Secondary | ICD-10-CM

## 2021-12-04 DIAGNOSIS — I5043 Acute on chronic combined systolic (congestive) and diastolic (congestive) heart failure: Secondary | ICD-10-CM | POA: Diagnosis present

## 2021-12-04 DIAGNOSIS — R079 Chest pain, unspecified: Secondary | ICD-10-CM | POA: Diagnosis not present

## 2021-12-04 DIAGNOSIS — I5023 Acute on chronic systolic (congestive) heart failure: Secondary | ICD-10-CM | POA: Diagnosis not present

## 2021-12-04 DIAGNOSIS — Z79899 Other long term (current) drug therapy: Secondary | ICD-10-CM

## 2021-12-04 DIAGNOSIS — R682 Dry mouth, unspecified: Secondary | ICD-10-CM | POA: Diagnosis present

## 2021-12-04 DIAGNOSIS — Z9841 Cataract extraction status, right eye: Secondary | ICD-10-CM

## 2021-12-04 DIAGNOSIS — J479 Bronchiectasis, uncomplicated: Secondary | ICD-10-CM | POA: Diagnosis not present

## 2021-12-04 DIAGNOSIS — I714 Abdominal aortic aneurysm, without rupture, unspecified: Secondary | ICD-10-CM | POA: Diagnosis present

## 2021-12-04 LAB — CBC WITH DIFFERENTIAL/PLATELET
Abs Immature Granulocytes: 0.03 10*3/uL (ref 0.00–0.07)
Basophils Absolute: 0.1 10*3/uL (ref 0.0–0.1)
Basophils Relative: 1 %
Eosinophils Absolute: 0.3 10*3/uL (ref 0.0–0.5)
Eosinophils Relative: 3 %
HCT: 34.9 % — ABNORMAL LOW (ref 39.0–52.0)
Hemoglobin: 11.2 g/dL — ABNORMAL LOW (ref 13.0–17.0)
Immature Granulocytes: 0 %
Lymphocytes Relative: 36 %
Lymphs Abs: 3.4 10*3/uL (ref 0.7–4.0)
MCH: 27.7 pg (ref 26.0–34.0)
MCHC: 32.1 g/dL (ref 30.0–36.0)
MCV: 86.2 fL (ref 80.0–100.0)
Monocytes Absolute: 0.9 10*3/uL (ref 0.1–1.0)
Monocytes Relative: 9 %
Neutro Abs: 4.8 10*3/uL (ref 1.7–7.7)
Neutrophils Relative %: 51 %
Platelets: 325 10*3/uL (ref 150–400)
RBC: 4.05 MIL/uL — ABNORMAL LOW (ref 4.22–5.81)
RDW: 16.7 % — ABNORMAL HIGH (ref 11.5–15.5)
WBC: 9.5 10*3/uL (ref 4.0–10.5)
nRBC: 0.2 % (ref 0.0–0.2)

## 2021-12-04 LAB — RESP PANEL BY RT-PCR (FLU A&B, COVID) ARPGX2
Influenza A by PCR: NEGATIVE
Influenza B by PCR: NEGATIVE
SARS Coronavirus 2 by RT PCR: NEGATIVE

## 2021-12-04 LAB — COMPREHENSIVE METABOLIC PANEL
ALT: 14 U/L (ref 0–44)
AST: 23 U/L (ref 15–41)
Albumin: 3.7 g/dL (ref 3.5–5.0)
Alkaline Phosphatase: 58 U/L (ref 38–126)
Anion gap: 9 (ref 5–15)
BUN: 23 mg/dL (ref 8–23)
CO2: 22 mmol/L (ref 22–32)
Calcium: 9.2 mg/dL (ref 8.9–10.3)
Chloride: 111 mmol/L (ref 98–111)
Creatinine, Ser: 1.16 mg/dL (ref 0.61–1.24)
GFR, Estimated: >60 mL/min (ref >60)
Glucose, Bld: 203 mg/dL — ABNORMAL HIGH (ref 70–99)
Potassium: 3.2 mmol/L — ABNORMAL LOW (ref 3.5–5.1)
Sodium: 142 mmol/L (ref 135–145)
Total Bilirubin: 0.7 mg/dL (ref 0.3–1.2)
Total Protein: 7.7 g/dL (ref 6.5–8.1)

## 2021-12-04 LAB — BLOOD GAS, VENOUS
Acid-base deficit: 5.6 mmol/L — ABNORMAL HIGH (ref 0.0–2.0)
Bicarbonate: 20.1 mmol/L (ref 20.0–28.0)
Delivery systems: POSITIVE
FIO2: 0.6
O2 Saturation: 84.6 %
Patient temperature: 37
RATE: 8 resp/min
pCO2, Ven: 39 mmHg — ABNORMAL LOW (ref 44.0–60.0)
pH, Ven: 7.32 (ref 7.250–7.430)
pO2, Ven: 54 mmHg — ABNORMAL HIGH (ref 32.0–45.0)

## 2021-12-04 LAB — CBG MONITORING, ED
Glucose-Capillary: 203 mg/dL — ABNORMAL HIGH (ref 70–99)
Glucose-Capillary: 152 mg/dL — ABNORMAL HIGH (ref 70–99)
Glucose-Capillary: 123 mg/dL — ABNORMAL HIGH (ref 70–99)

## 2021-12-04 LAB — BRAIN NATRIURETIC PEPTIDE: B Natriuretic Peptide: >4500 pg/mL — ABNORMAL HIGH (ref 0.0–100.0)

## 2021-12-04 LAB — TROPONIN I (HIGH SENSITIVITY)
Troponin I (High Sensitivity): 47 ng/L — ABNORMAL HIGH (ref <18)
Troponin I (High Sensitivity): 238 ng/L (ref <18)
Troponin I (High Sensitivity): 314 ng/L (ref <18)
Troponin I (High Sensitivity): 694 ng/L (ref <18)

## 2021-12-04 LAB — PROCALCITONIN: Procalcitonin: <0.1 ng/mL

## 2021-12-04 LAB — LACTIC ACID, PLASMA
Lactic Acid, Venous: 1.3 mmol/L (ref 0.5–1.9)
Lactic Acid, Venous: 1.1 mmol/L (ref 0.5–1.9)

## 2021-12-04 MED ORDER — ASPIRIN EC 81 MG PO TBEC
81.0000 mg | DELAYED_RELEASE_TABLET | Freq: Every day | ORAL | Status: DC
  Administered 2021-12-05 – 2021-12-08 (×4): 81 mg via ORAL
  Filled 2021-12-04 (×4): qty 1

## 2021-12-04 MED ORDER — ADULT MULTIVITAMIN W/MINERALS CH
1.0000 | ORAL_TABLET | Freq: Every day | ORAL | Status: DC
  Administered 2021-12-05 – 2021-12-08 (×4): 1 via ORAL
  Filled 2021-12-04 (×4): qty 1

## 2021-12-04 MED ORDER — DONEPEZIL HCL 5 MG PO TABS
5.0000 mg | ORAL_TABLET | Freq: Every day | ORAL | Status: DC
  Administered 2021-12-05 – 2021-12-08 (×4): 5 mg via ORAL
  Filled 2021-12-04 (×4): qty 1

## 2021-12-04 MED ORDER — FUROSEMIDE 10 MG/ML IJ SOLN
40.0000 mg | Freq: Once | INTRAMUSCULAR | Status: AC
  Administered 2021-12-04: 40 mg via INTRAVENOUS
  Filled 2021-12-04: qty 4

## 2021-12-04 MED ORDER — SODIUM CHLORIDE 0.9 % IV SOLN: 250.0000 mL | INTRAVENOUS | Status: DC | PRN

## 2021-12-04 MED ORDER — ALBUTEROL SULFATE (2.5 MG/3ML) 0.083% IN NEBU
3.0000 mL | INHALATION_SOLUTION | Freq: Four times a day (QID) | RESPIRATORY_TRACT | Status: DC | PRN
  Administered 2021-12-04: 3 mL via RESPIRATORY_TRACT
  Filled 2021-12-04: qty 3

## 2021-12-04 MED ORDER — SIMVASTATIN 20 MG PO TABS
40.0000 mg | ORAL_TABLET | Freq: Every day | ORAL | Status: DC
  Administered 2021-12-04 – 2021-12-07 (×4): 40 mg via ORAL
  Filled 2021-12-04 (×2): qty 2
  Filled 2021-12-04: qty 4
  Filled 2021-12-04: qty 2

## 2021-12-04 MED ORDER — ONDANSETRON HCL 4 MG/2ML IJ SOLN: 4.0000 mg | Freq: Four times a day (QID) | INTRAMUSCULAR | Status: DC | PRN

## 2021-12-04 MED ORDER — RIFAXIMIN 550 MG PO TABS
550.0000 mg | ORAL_TABLET | Freq: Three times a day (TID) | ORAL | Status: DC
  Administered 2021-12-04 – 2021-12-08 (×12): 550 mg via ORAL
  Filled 2021-12-04 (×14): qty 1

## 2021-12-04 MED ORDER — SACUBITRIL-VALSARTAN 49-51 MG PO TABS
1.0000 | ORAL_TABLET | Freq: Two times a day (BID) | ORAL | Status: DC
  Administered 2021-12-04 – 2021-12-08 (×8): 1 via ORAL
  Filled 2021-12-04 (×9): qty 1

## 2021-12-04 MED ORDER — METOPROLOL SUCCINATE ER 25 MG PO TB24
25.0000 mg | ORAL_TABLET | Freq: Every day | ORAL | Status: DC
  Administered 2021-12-04 – 2021-12-07 (×4): 25 mg via ORAL
  Filled 2021-12-04 (×4): qty 1

## 2021-12-04 MED ORDER — FUROSEMIDE 10 MG/ML IJ SOLN
40.0000 mg | Freq: Every day | INTRAMUSCULAR | Status: DC
  Administered 2021-12-05: 10:00:00 40 mg via INTRAVENOUS
  Filled 2021-12-04: qty 4

## 2021-12-04 MED ORDER — INSULIN ASPART 100 UNIT/ML IJ SOLN
0.0000 [IU] | Freq: Three times a day (TID) | INTRAMUSCULAR | Status: DC
  Administered 2021-12-04: 5 [IU] via SUBCUTANEOUS
  Administered 2021-12-04: 3 [IU] via SUBCUTANEOUS
  Administered 2021-12-05: 10:00:00 2 [IU] via SUBCUTANEOUS
  Administered 2021-12-05: 12:00:00 3 [IU] via SUBCUTANEOUS
  Administered 2021-12-05: 16:00:00 2 [IU] via SUBCUTANEOUS
  Administered 2021-12-06 (×3): 3 [IU] via SUBCUTANEOUS
  Administered 2021-12-07: 5 [IU] via SUBCUTANEOUS
  Administered 2021-12-07 (×2): 3 [IU] via SUBCUTANEOUS
  Administered 2021-12-08: 2 [IU] via SUBCUTANEOUS
  Filled 2021-12-04 (×11): qty 1

## 2021-12-04 MED ORDER — SODIUM CHLORIDE 0.9% FLUSH
3.0000 mL | Freq: Two times a day (BID) | INTRAVENOUS | Status: DC
  Administered 2021-12-04 – 2021-12-08 (×9): 3 mL via INTRAVENOUS

## 2021-12-04 MED ORDER — ISOSORBIDE MONONITRATE ER 60 MG PO TB24
60.0000 mg | ORAL_TABLET | Freq: Every day | ORAL | Status: DC
  Administered 2021-12-05 – 2021-12-08 (×4): 60 mg via ORAL
  Filled 2021-12-04 (×4): qty 1

## 2021-12-04 MED ORDER — SODIUM CHLORIDE 0.9 % IV BOLUS: 500.0000 mL | Freq: Once | INTRAVENOUS | Status: DC

## 2021-12-04 MED ORDER — SODIUM CHLORIDE 0.9 % IV SOLN
1.0000 g | Freq: Once | INTRAVENOUS | Status: AC
  Administered 2021-12-04: 1 g via INTRAVENOUS
  Filled 2021-12-04: qty 10

## 2021-12-04 MED ORDER — ASCORBIC ACID 500 MG PO TABS
500.0000 mg | ORAL_TABLET | Freq: Every day | ORAL | Status: DC
  Administered 2021-12-05 – 2021-12-08 (×4): 500 mg via ORAL
  Filled 2021-12-04 (×4): qty 1

## 2021-12-04 MED ORDER — ACETAMINOPHEN 500 MG PO TABS: 1000.0000 mg | ORAL_TABLET | Freq: Four times a day (QID) | ORAL | Status: DC | PRN

## 2021-12-04 MED ORDER — DIPHENOXYLATE-ATROPINE 2.5-0.025 MG PO TABS
2.0000 | ORAL_TABLET | Freq: Three times a day (TID) | ORAL | Status: DC | PRN
  Administered 2021-12-05: 01:00:00 2 via ORAL
  Filled 2021-12-04: qty 2

## 2021-12-04 MED ORDER — ONDANSETRON HCL 4 MG PO TABS: 4.0000 mg | ORAL_TABLET | Freq: Four times a day (QID) | ORAL | Status: DC | PRN

## 2021-12-04 MED ORDER — SERTRALINE HCL 50 MG PO TABS
50.0000 mg | ORAL_TABLET | Freq: Every day | ORAL | Status: DC
  Administered 2021-12-04 – 2021-12-07 (×4): 50 mg via ORAL
  Filled 2021-12-04 (×4): qty 1

## 2021-12-04 MED ORDER — APIXABAN 5 MG PO TABS
5.0000 mg | ORAL_TABLET | Freq: Two times a day (BID) | ORAL | Status: DC
  Administered 2021-12-04 – 2021-12-08 (×8): 5 mg via ORAL
  Filled 2021-12-04 (×8): qty 1

## 2021-12-04 MED ORDER — SODIUM CHLORIDE 0.9% FLUSH: 3.0000 mL | INTRAVENOUS | Status: DC | PRN

## 2021-12-04 MED ORDER — VITAMIN D 25 MCG (1000 UNIT) PO TABS
1000.0000 [IU] | ORAL_TABLET | Freq: Every day | ORAL | Status: DC
  Administered 2021-12-05 – 2021-12-08 (×4): 1000 [IU] via ORAL
  Filled 2021-12-04 (×4): qty 1

## 2021-12-04 MED ORDER — NITROGLYCERIN 0.4 MG SL SUBL: 0.4000 mg | SUBLINGUAL_TABLET | SUBLINGUAL | Status: DC | PRN

## 2021-12-04 MED ORDER — LORATADINE 10 MG PO TABS
10.0000 mg | ORAL_TABLET | Freq: Every day | ORAL | Status: DC
  Administered 2021-12-05 – 2021-12-08 (×4): 10 mg via ORAL
  Filled 2021-12-04 (×4): qty 1

## 2021-12-04 MED ORDER — BUDESONIDE 0.5 MG/2ML IN SUSP
0.5000 mg | Freq: Two times a day (BID) | RESPIRATORY_TRACT | Status: DC
  Administered 2021-12-04 – 2021-12-08 (×9): 0.5 mg via RESPIRATORY_TRACT
  Filled 2021-12-04 (×9): qty 2

## 2021-12-04 MED ORDER — SPIRONOLACTONE 25 MG PO TABS
12.5000 mg | ORAL_TABLET | Freq: Every day | ORAL | Status: DC
  Administered 2021-12-05 – 2021-12-08 (×4): 12.5 mg via ORAL
  Filled 2021-12-04 (×3): qty 0.5
  Filled 2021-12-04: qty 1
  Filled 2021-12-04: qty 0.5
  Filled 2021-12-04 (×2): qty 1
  Filled 2021-12-04: qty 0.5
  Filled 2021-12-04: qty 1

## 2021-12-04 MED ORDER — FLUTICASONE-UMECLIDIN-VILANT 100-62.5-25 MCG/ACT IN AEPB: 1.0000 | INHALATION_SPRAY | Freq: Every day | RESPIRATORY_TRACT | Status: DC

## 2021-12-04 MED ORDER — SODIUM CHLORIDE 0.9 % IV SOLN
500.0000 mg | Freq: Once | INTRAVENOUS | Status: AC
  Administered 2021-12-04: 500 mg via INTRAVENOUS
  Filled 2021-12-04: qty 5

## 2021-12-04 MED ORDER — MEGESTROL ACETATE 20 MG PO TABS
40.0000 mg | ORAL_TABLET | Freq: Two times a day (BID) | ORAL | Status: DC
  Administered 2021-12-04 – 2021-12-08 (×8): 40 mg via ORAL
  Filled 2021-12-04 (×10): qty 2

## 2021-12-04 NOTE — H&P (Addendum)
History and Physical    Howard [name] FTD:[id number] DOB: 1931/10/17 DOA: 12/04/2021  PCP: Jerrol Banana., MD   Patient coming from: Home  I have personally briefly reviewed patient's old medical records in [place]  Chief Complaint: Chest pain/shortness of breath   Most of the history was obtained from patient's wife at the bedside.  Patient is currently on a BiPAP.  HPI: Howard [name] is a 87 y.o. male with medical history significant for  HFrEF (with last known LVEF of 20 - 25%), diabetes mellitus with complications of stage III chronic kidney disease, gastric reflux, hypertension, hyperlipidemia, non-small cell lung cancer s/p radiation, chronic respiratory failure with emphysema, on 3 L of oxygen at home, complete heart block status post pacemaker insertion, CAD, who presents to the emergency department for evaluation of chest pain and shortness of breath.  Patient's wife states that he was in his usual state of health 1 day prior to his admission and had gone for his scheduled appointment with his cardiologist and primary care physician.  Prior to going to bed he complained of midsternal chest pain and requested a sublingual nitroglycerin which resolved his symptoms.  He went to bed and shortly after started complaining of difficulty breathing.  He felt like he was not getting enough oxygen and increase his oxygen without any improvement.  Due to worsening shortness of breath his wife called EMS.  She states that he has gained about 7 pounds over the last couple of weeks and he also has bilateral lower extremity swelling.  He has a cough productive of clear phlegm but denies having any fever or chills.  She is concerned about continued diarrhea despite being on Imodium and Lomotil. He denies having any abdominal pain, no urinary symptoms, no headache, no blurred vision, no dizziness, no focal deficit, no urinary frequency, no nocturia or  dysuria. VBG 7.32/39/54/20.1/84.6 Sodium 142, potassium 3.2, chloride 111, bicarb 22, glucose 203, BUN 23, creatinine 1.16, calcium 9.2, alkaline phosphatase 58, albumin 3.7, AST 23, ALT 14, total protein 7.7, BNP 4500, troponin 47 >> 238, lactic acid 1.1, white count 9.5, hemoglobin 11.2, hematocrit 34.9, MCV 86.2, RDW 16.7, platelet count 325 Respiratory viral panel is negative Chest x-ray reviewed by me shows increased opacities in the right lung concerning for multilobar pneumonia. CT scan of the chest without contrast shows new bilateral pleural effusions.  Increased bilateral dependent groundglass opacities with background right lower lobe and right mediastinal consolidations, findings are favored to represent atelectasis and pulmonary edema. Twelve-lead EKG reviewed by me shows sinus tachycardia, LVH and right bundle branch block.     ED Course: Patient is an 87 year old male with a history of ischemic cardiomyopathy with last known LVEF of 20 to 25% who presents to the ER for evaluation of chest pain and shortness of breath. When EMS arrived patient had pulse oximetry in the mid 80s on his 3 L and was placed on 15 L with improvement in his pulse oximetry to the 90s.  He was switched to BiPAP in the ER to reduce work of breathing. Imaging shows bilateral pleural effusions with pulmonary edema and BNP is elevated Patient also has an uptrending troponin level. He will be admitted to the hospital for further evaluation.    Review of Systems: As per HPI otherwise all other systems reviewed and negative.    Past Medical History:  Diagnosis Date   Arthritis    Bell palsy    Bell's palsy 04/12/2015  Cancer [facility])    prostate and skin   Chronic combined systolic and diastolic CHF, NYHA class 1 ([place])    a. 07/2014 Echo: EF 35-40%, Gr 1 DD.   Complete heart block ([place])    a. 11/2010 s/p SJM 2210 [facility] DC PPM, ser# [id number].   Depression    Diabetes mellitus without complication ([place])     Fall 11-10-14   GERD (gastroesophageal reflux disease)    History of prostate cancer    Hyperlipidemia    Hypertension    LBBB (left bundle branch block)    Left-sided Bell's palsy    Lung cancer ([place]) 2016   NICM (nonischemic cardiomyopathy) ([place])    a. 07/2014 Echo: EF 35-40%, mid-apicalanteroseptal DK, Gr 1 DD, mild-mod dil LA.   Non-obstructive CAD    a. 07/2014 Abnl MV;  b. 08/2014 Cath: LM nl, LAD 30p, RI 40p, LCX nl, OM1 40, RCA dominant 30p, 70d-->Med Rx.   Poor balance    Presence of permanent cardiac pacemaker    Sleep apnea    a. cpap   Vertigo    WPW (Wolff-Parkinson-White syndrome)    a. S/P RFCA 1991.    Past Surgical History:  Procedure Laterality Date   ABDOMINAL AORTIC ENDOVASCULAR STENT GRAFT  08/25/2019   Procedure: ABDOMINAL AORTIC ENDOVASCULAR STENT GRAFT;  Surgeon: Algernon Huxley, MD;  Location: [facility] ORS;  Service: Vascular;;   ANGIOPLASTY Left 08/25/2019   Procedure: ANGIOPLASTY;  Surgeon: Algernon Huxley, MD;  Location: [facility] ORS;  Service: Vascular;  Laterality: Left;  left SFA and stent placement   APPLICATION OF WOUND VAC Left 06/07/2015   Procedure: APPLICATION OF WOUND VAC;  Surgeon: Robert Bellow, MD;  Location: [facility] ORS;  Service: General;  Laterality: Left;  left upper back   [place]  08/26/2014   Single vessel obstructive CAD   CARPAL TUNNEL RELEASE  04-04-15   [facility]   CATARACT EXTRACTION  07-31-11 and 09-18-11   Catheter ablation  1991   for WPW   cervical fusion     CHOLECYSTECTOMY  09-07-14   ENDARTERECTOMY FEMORAL Left 08/25/2019   Procedure: ENDARTERECTOMY FEMORAL;  Surgeon: Algernon Huxley, MD;  Location: [facility] ORS;  Service: Vascular;  Laterality: Left;  common and produndis    ENDOVASCULAR REPAIR/STENT GRAFT Right 08/25/2019   Procedure: ENDOVASCULAR REPAIR/STENT GRAFT;  Surgeon: Algernon Huxley, MD;  Location: [facility] ORS;  Service: Vascular;  Laterality: Right;  renal artery   HAND SURGERY     right 1993; left 2005    [facility] / REPLACE / REMOVE PACEMAKER     INSERTION OF ILIAC STENT Bilateral 08/25/2019   Procedure: INSERTION OF ILIAC STENT;  Surgeon: Algernon Huxley, MD;  Location: [facility] ORS;  Service: Vascular;  Laterality: Bilateral;   JOINT REPLACEMENT Left 2013   knee   JOINT REPLACEMENT Right 2004   knee   KNEE SURGERY     left knee 1991 and 1992; right knee [place] N/A 08/26/2014   Procedure: LEFT HEART CATHETERIZATION WITH CORONARY ANGIOGRAM;  Surgeon: [name], MD;  Location: [facility] CATH LAB;  Service: Cardiovascular;  Laterality: N/A;   LOWER EXTREMITY ANGIOGRAPHY Left 08/23/2019   Procedure: Lower Extremity Angiography;  Surgeon: Algernon Huxley, MD;  Location: [place] CV LAB;  Service: Cardiovascular;  Laterality: Left;   LUMBAR LAMINECTOMY/DECOMPRESSION MICRODISCECTOMY N/A 06/07/2014   Procedure:  LUMBAR FOUR TO FIVE LUMBAR LAMINECTOMY/DECOMPRESSION MICRODISCECTOMY 1 LEVEL;  Surgeon: [name], MD;  Location: [place] NEURO ORS;  Service: Neurosurgery;  Laterality: N/A;   LUNG BIOPSY Right 2016   Dr [name]   MOHS SURGERY     PACEMAKER INSERTION     PPM-- [facility] 11/30/10 by [name]   PPM GENERATOR CHANGEOUT N/A 07/09/2019   Procedure: PPM GENERATOR CHANGEOUT;  Surgeon: Deboraha Sprang, MD;  Location: [place] CV LAB;  Service: Cardiovascular;  Laterality: N/A;   PROSTATE SURGERY     cancer--1998, prostatectomy   REPLACEMENT TOTAL KNEE     2004   RIGHT/LEFT HEART CATH AND CORONARY ANGIOGRAPHY Bilateral 04/13/2021   Procedure: RIGHT/LEFT HEART CATH AND CORONARY ANGIOGRAPHY;  Surgeon: [place] Hampshire, MD;  Location: [place] CV LAB;  Service: Cardiovascular;  Laterality: Bilateral;   ruptured disc     1962 and 1998   TEE WITHOUT CARDIOVERSION N/A 09/01/2019   Procedure: TRANSESOPHAGEAL ECHOCARDIOGRAM (TEE);  Surgeon: [name], MD;  Location: [facility] ORS;  Service: Cardiovascular;  Laterality: N/A;   TEMPORARY PACEMAKER N/A  07/09/2019   Procedure: TEMPORARY PACEMAKER;  Surgeon: Deboraha Sprang, MD;  Location: [place] CV LAB;  Service: Cardiovascular;  Laterality: N/A;   TRIGGER FINGER RELEASE  01-24-15   WOUND DEBRIDEMENT Left 06/07/2015   Procedure: DEBRIDEMENT WOUND;  Surgeon: Robert Bellow, MD;  Location: [facility] ORS;  Service: General;  Laterality: Left;  left upper back     reports that he quit smoking about 11 years ago. His smoking use included cigarettes. He has a 56.00 pack-year smoking history. He has never used smokeless tobacco. He reports that he does not drink alcohol and does not use drugs.  Allergies  Allergen Reactions   Sulfa Antibiotics Rash    Family History  Problem Relation Age of Onset   Heart attack Mother    Hyperlipidemia Mother    CAD Other    Prostate cancer Neg Hx    Cancer Neg Hx       Prior to Admission medications   Medication Sig Start Date End Date Taking? Authorizing Provider  acetaminophen (TYLENOL) 500 MG tablet Take 1,000 mg by mouth every 6 (six) hours as needed for moderate pain.    [provider]  albuterol (VENTOLIN HFA) 108 (90 Base) MCG/ACT inhaler INHALE 2 PUFFS INTO LUNGS EVERY 6 HOURS AS NEEDED FOR WHEEZING OR SHORTNESS OF BREATH 07/23/21   Tyler Pita, MD  apixaban (ELIQUIS) 5 MG TABS tablet Take 5 mg by mouth 2 (two) times daily.    [provider]  aspirin EC 81 MG tablet Take 81 mg by mouth daily.    [provider]  cetirizine (ZYRTEC) 10 MG tablet Take 10 mg by mouth daily.    [provider]  Cholecalciferol (VITAMIN D3) 1000 UNITS CAPS Take 1,000 Units by mouth daily.    [provider]  diphenoxylate-atropine (LOMOTIL) 2.5-0.025 MG tablet Take 1 to 2 tablet by mouth daily as needed for diarrhea 11/22/21   Lucilla Lame, MD  donepezil (ARICEPT) 5 MG tablet Take 1 tablet (5 mg total) by mouth daily. 10/23/21   Jerrol Banana., MD  ezetimibe (ZETIA) 10 MG tablet TAKE 1 TABLET DAILY 09/24/21    [name], MD  Fluticasone-Umeclidin-Vilant (TRELEGY ELLIPTA) 100-62.5-25 MCG/INH AEPB Inhale 1 puff into the lungs daily. 02/14/21   Tyler Pita, MD  furosemide (LASIX) 20 MG tablet TAKE 1 TABLET BY MOUTH ONCE DAILY  10/09/21   [name], MD  glipiZIDE (GLUCOTROL XL) 5 MG 24 hr tablet Take 1 tablet (5 mg total) by mouth daily with breakfast. 12/03/21   Birdie Sons, MD  Iron-Vitamin C (VITRON-C) 65-125 MG TABS Take 1 tablet by mouth daily. 04/16/21   Earlie Server, MD  isosorbide mononitrate (IMDUR) 60 MG 24 hr tablet Take 1 tablet (60 mg total) by mouth daily. 11/10/21   Elgergawy, Silver Huguenin, MD  megestrol (MEGACE) 40 MG tablet Take 1 tablet (40 mg total) by mouth 2 (two) times daily. 09/18/21   Earlie Server, MD  metFORMIN (GLUCOPHAGE) 500 MG tablet Take 1 tablet (500 mg total) by mouth 2 (two) times daily with a meal. (PUT ON HOLD 12/03/2021) Patient not taking: Reported on 12/03/2021 12/03/21   Birdie Sons, MD  metoprolol succinate (TOPROL-XL) 25 MG 24 hr tablet TAKE 1 TABLET AT BEDTIME 09/24/21   Gollan, Kathlene November, MD  montelukast (SINGULAIR) 10 MG tablet Take 10 mg by mouth daily as needed (sneezing/allergies.).    [provider]  Multiple Vitamin (MULTIVITAMIN WITH MINERALS) TABS tablet Take 1 tablet by mouth daily. One-A-Day Multivitamin    [provider]  nitroGLYCERIN (NITROSTAT) 0.4 MG SL tablet PLACE 1 TABLET UNDER TONGUE EVERY 5 MIN AS NEEDED FOR CHEST PAIN IF NO RELIEF IN15 MIN CALL 911 (MAX 2 TABS) 11/19/21   Gollan, Kathlene November, MD  omeprazole (PRILOSEC) 40 MG capsule TAKE 1 CAPSULE TWICE DAILY AS NEEDED 02/12/21   Chrismon, [name], PA-C  rifaximin (XIFAXAN) 550 MG TABS tablet Take 1 tablet (550 mg total) by mouth 3 (three) times daily. Patient not taking: Reported on 12/03/2021 12/03/21   Birdie Sons, MD  sertraline (ZOLOFT) 50 MG tablet Take 1 tablet (50 mg total) by mouth daily. 10/23/21   Jerrol Banana., MD  simvastatin (ZOCOR) 40  MG tablet TAKE 1 TABLET AT BEDTIME 03/23/21   [name], MD  SODIUM BICARBONATE PO Take 1 tablet by mouth 3 (three) times daily.    [provider]  spironolactone (ALDACTONE) 25 MG tablet Take 0.5 tablets (12.5 mg total) by mouth daily. 11/09/21 11/04/22  Elgergawy, Silver Huguenin, MD    Physical Exam: Vitals:   12/04/21 0545 12/04/21 0600 12/04/21 0730 12/04/21 0830  BP:  (!) 115/56 126/74 134/90  Pulse: 83 78 81 93  Resp: (!) 26 (!) 24 (!) 22 (!) 24  Temp:      TempSrc:      SpO2: 100% 98% 98% 100%     Vitals:   12/04/21 0545 12/04/21 0600 12/04/21 0730 12/04/21 0830  BP:  (!) 115/56 126/74 134/90  Pulse: 83 78 81 93  Resp: (!) 26 (!) 24 (!) 22 (!) 24  Temp:      TempSrc:      SpO2: 100% 98% 98% 100%      Constitutional: Alert and oriented x 3 .  Appears comfortable on BiPAP HEENT:      Head: Normocephalic and atraumatic.         Eyes: PERLA, EOMI, Conjunctivae are normal. Sclera is non-icteric.       Mouth/Throat: Mucous membranes are moist.       Neck: Supple with no signs of meningismus. Cardiovascular: Regular rate and rhythm. No murmurs, gallops, or rubs. 2+ symmetrical distal pulses are present . No JVD. 1+ LE edema Respiratory: Tachypneic.crackles in both lung bases  bilaterally. No wheezes or rhonchi.  Gastrointestinal: Soft, non tender, and non distended with  positive bowel sounds.  Genitourinary: No CVA tenderness. Musculoskeletal: Nontender with normal range of motion in all extremities. No cyanosis, or erythema of extremities. Neurologic:  Face is symmetric. Moving all extremities. No gross focal neurologic deficits .  Generalized weakness Skin: Skin is warm, dry.  No rash or ulcers Psychiatric: Mood and affect are normal    Labs on Admission: I have personally reviewed following labs and imaging studies  CBC: Recent Labs  Lab 12/04/21 0413  WBC 9.5  NEUTROABS 4.8  HGB 11.2*  HCT 34.9*  MCV 86.2  PLT 614   Basic Metabolic  Panel: Recent Labs  Lab 12/04/21 0413  NA 142  K 3.2*  CL 111  CO2 22  GLUCOSE 203*  BUN 23  CREATININE 1.16  CALCIUM 9.2   GFR: Estimated Creatinine Clearance: 44 mL/min (by C-G formula based on SCr of 1.16 mg/dL). Liver Function Tests: Recent Labs  Lab 12/04/21 0413  AST 23  ALT 14  ALKPHOS 58  BILITOT 0.7  PROT 7.7  ALBUMIN 3.7   No results for input(s): LIPASE, AMYLASE in the last 168 hours. No results for input(s): AMMONIA in the last 168 hours. Coagulation Profile: No results for input(s): INR, PROTIME in the last 168 hours. Cardiac Enzymes: No results for input(s): CKTOTAL, CKMB, CKMBINDEX, TROPONINI in the last 168 hours. BNP (last 3 results) No results for input(s): PROBNP in the last 8760 hours. HbA1C: No results for input(s): HGBA1C in the last 72 hours. CBG: No results for input(s): GLUCAP in the last 168 hours. Lipid Profile: No results for input(s): CHOL, HDL, LDLCALC, TRIG, CHOLHDL, LDLDIRECT in the last 72 hours. Thyroid Function Tests: No results for input(s): TSH, T4TOTAL, FREET4, T3FREE, THYROIDAB in the last 72 hours. Anemia Panel: No results for input(s): VITAMINB12, FOLATE, FERRITIN, TIBC, IRON, RETICCTPCT in the last 72 hours. Urine analysis:    Component Value Date/Time   COLORURINE YELLOW (A) 11/08/2021 0724   APPEARANCEUR HAZY (A) 11/08/2021 0724   APPEARANCEUR Clear 05/07/2016 1026   LABSPEC 1.025 11/08/2021 0724   LABSPEC 1.015 11/11/2014 0450   PHURINE 5.0 11/08/2021 0724   GLUCOSEU NEGATIVE 11/08/2021 0724   GLUCOSEU Negative 11/11/2014 0450   HGBUR NEGATIVE 11/08/2021 0724   BILIRUBINUR NEGATIVE 11/08/2021 0724   BILIRUBINUR Negative 05/07/2016 1026   BILIRUBINUR Negative 11/11/2014 0450   KETONESUR 5 (A) 11/08/2021 0724   PROTEINUR 30 (A) 11/08/2021 0724   UROBILINOGEN 0.2 04/18/2016 1057   UROBILINOGEN 0.2 06/15/2010 0942   NITRITE NEGATIVE 11/08/2021 0724   LEUKOCYTESUR NEGATIVE 11/08/2021 0724   LEUKOCYTESUR Negative  11/11/2014 0450    Radiological Exams on Admission: CT CHEST WO CONTRAST  Result Date: 12/04/2021 CLINICAL DATA:  Concern for infection EXAM: CT CHEST WITHOUT CONTRAST TECHNIQUE: Multidetector CT imaging of the chest was performed following the standard protocol without IV contrast. COMPARISON:  Multiple priors, most recent dated October 24, 2021 FINDINGS: Cardiovascular: Mild cardiomegaly. Trace pericardial effusion. Three-vessel coronary artery calcifications left chest wall dual lead pacer with leads in the right atrium and right ventricle. Mitral annular calcifications. Atherosclerotic disease of the thoracic aorta. Mediastinum/Nodes: Esophagus and thyroid are unremarkable. Stable right upper paratracheal lymph node measuring 1.0 cm on series 2 image 152 no pathologically enlarged lymph nodes seen in the chest. Lungs/Pleura: Central airways are patent. Increased bilateral dependent ground-glass opacities. Right lower lobe and paramediastinal consolidations with associated bronchiectasis, are similar to prior exam, although evaluation is limited due to presence of new pleural effusions and ground-glass opacities. Small right and  trace left pleural effusions. Stable calcified nodule the right middle lobe measuring 2.0 cm and stable calcified nodule of the left lingula measuring 2.2 cm. Upper Abdomen: Cholecystectomy clips.  No acute abnormality. Musculoskeletal: No chest wall mass or suspicious bone lesions identified. IMPRESSION: 1. New small right and trace left pleural effusions. Increased bilateral dependent ground-glass opacities with background right lower lobe and right paramediastinal consolidations, findings are favored to represent atelectasis and pulmonary edema superimposed on background post radiation changes. Recommend follow-up after resolution of acute symptoms to better assess post treatment changes. 2. Stable solid partially calcified nodules of the right middle lobe and lingula. 3.  Three-vessel coronary artery calcifications. 4. Aortic Atherosclerosis (ICD10-I70.0). Electronically Signed   By: Yetta Glassman M.D.   On: 12/04/2021 09:22   DG Chest Portable 1 View  Result Date: 12/04/2021 CLINICAL DATA:  Respiratory distress and chest pain. History of lung cancer. EXAM: PORTABLE CHEST 1 VIEW COMPARISON:  Portable chest 11/05/2021 FINDINGS: The cardiac size is normal. There is patchy calcification throughout the thoracic aorta. Left chest dual lead pacing system and wire insertions are stable. The central vessels are normal in caliber. On the left there are scattered linear scar-like opacities in the lower lung zone but no evidence of focal pneumonia. On the right there is increased upper lobe opacity in the mid perihilar area, small increased right pleural effusion and additional increased opacity in the medial basal right lung, findings concerning for multilobar pneumonia. The left sulci are sharp. IMPRESSION: Increased opacities in the right lung concerning for multilobar pneumonia. Clinical correlation and radiographic follow-up recommended. Small right pleural effusion. Electronically Signed   By: Telford Nab M.D.   On: 12/04/2021 04:44     Assessment/Plan Principal Problem:   Acute on chronic systolic CHF (congestive heart failure) (HCC) Active Problems:   GERD (gastroesophageal reflux disease)   CAD (coronary artery disease)   Acute on chronic respiratory failure (HCC)   CKD stage 3 due to type 2 diabetes mellitus (HCC)   Acute CHF (congestive heart failure) ([place])   Malnutrition ([place])      Patient is an 87 year old male admitted to the hospital for acute on chronic systolic heart failure.    Acute on chronic systolic heart failure Patient presents for evaluation of sudden onset shortness of breath and chest pain Patient has had a 7 pound weight gain over the last couple of weeks Imaging shows bilateral pleural effusions with pulmonary edema and BNP is  elevated Last known LVEF of 20 to 25% with global hypokinesis and LVH (11/22) Will place patient on Lasix 40 mg IV daily Continue spironolactone, metoprolol and Entresto Maintain low-sodium diet Check daily weights       Acute on chronic respiratory failure Secondary to acute systolic CHF At baseline patient usually wears 3 L of oxygen continuous for his known history of COPD as well as lung cancer but had pulse oximetry in the 80s with associated tachypnea and increased work of breathing.  He is currently on a BiPAP to reduce work of breathing and improve oxygenation Will wean off BiPAP as tolerated     Diabetes mellitus with complications of stage III chronic kidney disease Maintain consistent carbohydrate diet Glycemic control with sliding scale insulin         Dementia with depression Continue donepezil and sertraline        History of complete heart block Status post pacemaker insertion       Elevated troponin/history of coronary artery disease Patient presents  for evaluation of chest pain relieved with nitroglycerin Has a known history of coronary artery disease and was recently admitted to the hospital for non-STEMI Medical management was recommended at that time We will cycle cardiac enzymes Continue metoprolol, nitrates and statins    Moderate protein calorie malnutrition Continue Megace for appetite stimulant Encourage increase  oral protein calorie intake     Chronic diarrhea Infectious etiology related Continue scheduled Lomotil   DVT prophylaxis: Apixaban Code Status: DNR Family Communication: Greater than 50% of time was spent discussing patient's condition and plan of care with his wife at the bedside.  All questions and concerns have been addressed.  She verbalizes understanding and agrees with the plan.  CODE STATUS was discussed and he is a DNR. Disposition Plan: Back to previous home environment Consults called: Cardiology Status:At  the time of admission, it appears that the appropriate admission status for this patient is inpatient. This is judged to be reasonable and necessary to provide the required intensity of service to ensure the patient's safety given the presenting symptoms, physical exam findings, and initial radiographic and laboratory data in the context of their comorbid conditions. Patient requires inpatient status due to high intensity of service, high risk for further deterioration and high frequency of surveillance required.     Collier Bullock MD [facility]     12/04/2021, 9:58 AM

## 2021-12-04 NOTE — ED Notes (Signed)
Patient at CT

## 2021-12-04 NOTE — ED Triage Notes (Signed)
Pt came from home per EMS. Woke about 2 hrs ago in respiratory distress and having chest pain. Took one nitro with some relief of CP pain. Hx of lung CA.

## 2021-12-04 NOTE — ED Notes (Signed)
Patient had large BM. Patient cleaned up. New brief placed and chuck pads under patient. Patient repositioned and warm blankets given. Family at bedside. No needs expressed at this time.

## 2021-12-04 NOTE — ED Notes (Signed)
Lunch tray at bedside. ?

## 2021-12-04 NOTE — ED Notes (Signed)
Pharmacy tech at bedside 

## 2021-12-04 NOTE — ED Notes (Signed)
Patient transferred to C-Pod, room 31. Report received from [place], [place]

## 2021-12-04 NOTE — ED Notes (Signed)
Pharmacy sent message to adjust times and to verify meds.

## 2021-12-04 NOTE — ED Notes (Addendum)
Pharmacy tech aware that patient needs med rec. Will given meds when verified by pharmacy.

## 2021-12-04 NOTE — ED Notes (Addendum)
Patient appears super anxious and states he cannot breath. Agb[place], MD aware and RT called. Patient placed back on bi-pap at this time. Will give nebs when verified by pharmacy. Message sent to pharmacy to verify.

## 2021-12-04 NOTE — ED Notes (Signed)
Breakfast tray placed at bedside.  

## 2021-12-04 NOTE — Consult Note (Signed)
Cardiology Consult    Patient ID: Howard [name] MRN: [id number], DOB/AGE: 06-25-1931   Admit date: 12/04/2021 Date of Consult: 12/04/2021  Primary Physician: Jerrol Banana., MD Primary Cardiologist: [name], MD Requesting Provider: Milon Dikes, MD  Patient Profile    Howard [name] is a 87 y.o. male with a history of nonobs CAD, HFrEF (20-25%), NICM, CHB s/p PPM, lung cancer s/p radiation, chronic hypoxic resp failure on supplemental O2 @ 3lpm, splenic infarct on chronic [place], AAA s/p endovascular grafting, PAD s/p multiple interventions, DMII, HTN, HL, bell's palsy, prostate cancer, vertigo, diarrhea, and OSA on CPAP, who is being seen today for the evaluation of chest pain and dyspnea at the request of Dr. Francine [name].  Past Medical History   Past Medical History:  Diagnosis Date   Arthritis    Bell palsy    Bell's palsy 04/12/2015   Cancer [facility])    prostate and skin   Chronic combined systolic and diastolic CHF, NYHA class 1 ([place])    a. 07/2014 Echo: EF 35-40%, Gr 1 DD; b. 03/2021 Echo: EF 20-25%; c. 10/2021 Echo: EF 20-25%, glob HK, GrII DD, nl RV fxn, mod LAE, mild to mod MR, mild-mod AoV scelrosis, Ao root 4mm.   Complete heart block ([place])    a. 11/2010 s/p SJM 2210 [facility] PPM, ser# [id number].   Depression    Diabetes mellitus without complication [facility])    Fall 11/10/2014   GERD (gastroesophageal reflux disease)    History of prostate cancer    Hyperlipidemia    Hypertension    LBBB (left bundle branch block)    Left-sided Bell's palsy    Lung cancer ([place]) 2016   NICM (nonischemic cardiomyopathy) ([place])    a. 07/2014 Echo: EF 35-40%, Gr 1 DD; b. 03/2021 Echo: EF 20-25%; c. 03/2021 Cath:  d. 10/2021 Echo: EF 20-25%, glob HK, GrII DD.   Non-obstructive CAD    a. 07/2014 Abnl MV;  b. 08/2014 Cath: LM nl, LAD 30p, RI 40p, LCX nl, OM1 40, RCA dominant 30p, 70d-->Med Rx; c. 03/2021 Cath: LM nl, LAD 20p/m, LCX 30ost/p, OM1 40, OM2 60, RCA 30p, 50d. RHC w/ minimally elevated  filling pressures.   Poor balance    Presence of permanent cardiac pacemaker    Sleep apnea    a. cpap   Vertigo    WPW (Wolff-Parkinson-White syndrome)    a. S/P RFCA 1991.    Past Surgical History:  Procedure Laterality Date   ABDOMINAL AORTIC ENDOVASCULAR STENT GRAFT  08/25/2019   Procedure: ABDOMINAL AORTIC ENDOVASCULAR STENT GRAFT;  Surgeon: Algernon Huxley, MD;  Location: [facility] ORS;  Service: Vascular;;   ANGIOPLASTY Left 08/25/2019   Procedure: ANGIOPLASTY;  Surgeon: Algernon Huxley, MD;  Location: [facility] ORS;  Service: Vascular;  Laterality: Left;  left SFA and stent placement   APPLICATION OF WOUND VAC Left 06/07/2015   Procedure: APPLICATION OF WOUND VAC;  Surgeon: Robert Bellow, MD;  Location: [facility] ORS;  Service: General;  Laterality: Left;  left upper back   [place]  08/26/2014   Single vessel obstructive CAD   CARPAL TUNNEL RELEASE  04-04-15   [facility]   CATARACT EXTRACTION  07-31-11 and 09-18-11   Catheter ablation  1991   for WPW   cervical fusion     CHOLECYSTECTOMY  09-07-14   ENDARTERECTOMY FEMORAL Left 08/25/2019   Procedure: ENDARTERECTOMY FEMORAL;  Surgeon: Algernon Huxley, MD;  Location: [facility] ORS;  Service: Vascular;  Laterality: Left;  common and produndis    ENDOVASCULAR REPAIR/STENT GRAFT Right 08/25/2019   Procedure: ENDOVASCULAR REPAIR/STENT GRAFT;  Surgeon: Algernon Huxley, MD;  Location: [facility] ORS;  Service: Vascular;  Laterality: Right;  renal artery   HAND SURGERY     right 1993; left 2005   [place] / REPLACE / REMOVE PACEMAKER     INSERTION OF ILIAC STENT Bilateral 08/25/2019   Procedure: INSERTION OF ILIAC STENT;  Surgeon: Algernon Huxley, MD;  Location: [facility] ORS;  Service: Vascular;  Laterality: Bilateral;   JOINT REPLACEMENT Left 2013   knee   JOINT REPLACEMENT Right 2004   knee   KNEE SURGERY     left knee 1991 and 1992; right knee [place] N/A 08/26/2014    Procedure: LEFT HEART CATHETERIZATION WITH CORONARY ANGIOGRAM;  Surgeon: [name], MD;  Location: [facility] CATH LAB;  Service: Cardiovascular;  Laterality: N/A;   LOWER EXTREMITY ANGIOGRAPHY Left 08/23/2019   Procedure: Lower Extremity Angiography;  Surgeon: Algernon Huxley, MD;  Location: [place] CV LAB;  Service: Cardiovascular;  Laterality: Left;   LUMBAR LAMINECTOMY/DECOMPRESSION MICRODISCECTOMY N/A 06/07/2014   Procedure: LUMBAR FOUR TO FIVE LUMBAR LAMINECTOMY/DECOMPRESSION MICRODISCECTOMY 1 LEVEL;  Surgeon: [name], MD;  Location: [place] NEURO ORS;  Service: Neurosurgery;  Laterality: N/A;   LUNG BIOPSY Right 2016   Dr [name]   MOHS SURGERY     PACEMAKER INSERTION     PPM-- [employer] 11/30/10 by [name]   PPM GENERATOR CHANGEOUT N/A 07/09/2019   Procedure: PPM GENERATOR CHANGEOUT;  Surgeon: Deboraha Sprang, MD;  Location: [place] CV LAB;  Service: Cardiovascular;  Laterality: N/A;   PROSTATE SURGERY     cancer--1998, prostatectomy   REPLACEMENT TOTAL KNEE     2004   RIGHT/LEFT HEART CATH AND CORONARY ANGIOGRAPHY Bilateral 04/13/2021   Procedure: RIGHT/LEFT HEART CATH AND CORONARY ANGIOGRAPHY;  Surgeon: Wellington Hampshire, MD;  Location: [place] CV LAB;  Service: Cardiovascular;  Laterality: Bilateral;   ruptured disc     1962 and 1998   TEE WITHOUT CARDIOVERSION N/A 09/01/2019   Procedure: TRANSESOPHAGEAL ECHOCARDIOGRAM (TEE);  Surgeon: [name], MD;  Location: [facility] ORS;  Service: Cardiovascular;  Laterality: N/A;   TEMPORARY PACEMAKER N/A 07/09/2019   Procedure: TEMPORARY PACEMAKER;  Surgeon: Deboraha Sprang, MD;  Location: [place] CV LAB;  Service: Cardiovascular;  Laterality: N/A;   TRIGGER FINGER RELEASE  01-24-15   WOUND DEBRIDEMENT Left 06/07/2015   Procedure: DEBRIDEMENT WOUND;  Surgeon: Robert Bellow, MD;  Location: [facility] ORS;  Service: General;  Laterality: Left;  left upper back     Allergies  Allergies  Allergen Reactions   Sulfa Antibiotics Rash     History of Present Illness    87 y.o. male with a history of nonobs CAD, HFrEF (20-25%), NICM, CHB s/p PPM, lung cancer s/p radiation, chronic hypoxic resp failure on supplemental O2 @ 3lpm, splenic infarct on chronic [place], AAA s/p endovascular grafting, PAD s/p multiple interventions, DMII, HTN, HL, bell's palsy, prostate cancer, vertigo, diarrhea, and OSA on CPAP.  He was initially diagnosed with a cardiomyopathy in August 2015 when his EF was 35 to 40%.  Diagnostic catheterization at that time showed nonobstructive disease.  With medical therapy, EF did improve and by August 2020, echocardiogram showed an EF of 55 to 60%.  In the setting of worsening  dyspnea, in April 2022, echocardiogram was performed and showed an EF of 20 to 25%.  This was followed by diagnostic catheterization that showed moderate, nonobstructive CAD as outlined in the past medical history.  In early November, he was admitted with respiratory failure, CHF, and non-STEMI (troponin up to 12,000).  Echo again showed an EF of 20 to 25% with grade 2 diastolic dysfunction, and mild to moderate mitral regurgitation.  He was medically managed.  Following discharge, he was seen as an outpatient under a rate and decision was made to on November 8 and decision was made to continue conservative therapy and forego repeat catheterization.  Unfortunately, he was readmitted November 14 for chest pain, which was sudden in onset.  He again had elevated troponins and was conservatively managed.  He also had significant diarrhea during admission.  C. difficile and GI panel were negative and he has since been followed by GI as an outpatient.  He was just seen in cardiology clinic on December 12, at which time he was felt to be stable.  His wife noted that based on weights across a number of provider visits recently, his weight had gone up 7 pounds.  He was in his USOH until the evening of 12/12, when while getting ready for bed, he noted chest discomfort  and used sl NTG w/ eventual relief.  He fell off to sleep but sometime around 3am, he awoke w/ dyspnea.  He used his inhaler w/o improvement and then sat up in his chair.  His wife called 67 and gave him an ASA and he continued to use his inhaler w/o relief.  Following EMS arrival, he was taken to the [facility] ED.  Here, he was hemodynamically stable.  Labs notable for hypokalemia at 3.2 with slight rise in creatinine to 1.16.  BNP was greater than 4500.  Troponin was elevated @ 47   238  314  694.  ECG showed AV paced rhythm at 116 bpm.  CT chest showed new small right and trace left pleural effusions with bilateral dependent groundglass opacities most consistent with atelectasis and pulmonary edema superimposed on background postradiation changes.  He was given a dose of IV Lasix as well as azithromycin and ceftriaxone.  Currently he is on BiPAP.  Inpatient Medications     apixaban  5 mg Oral BID   ascorbic acid  500 mg Oral Daily   aspirin EC  81 mg Oral Daily   budesonide (PULMICORT) nebulizer solution  0.5 mg Nebulization BID   cholecalciferol  1,000 Units Oral Daily   donepezil  5 mg Oral Daily   [START ON 12/05/2021] furosemide  40 mg Intravenous Daily   insulin aspart  0-15 Units Subcutaneous TID WC   isosorbide mononitrate  60 mg Oral Daily   loratadine  10 mg Oral Daily   megestrol  40 mg Oral BID   metoprolol succinate  25 mg Oral QHS   multivitamin with minerals  1 tablet Oral Daily   rifaximin  550 mg Oral TID   sacubitril-valsartan  1 tablet Oral BID   sertraline  50 mg Oral QHS   simvastatin  40 mg Oral QHS   sodium chloride flush  3 mL Intravenous Q12H   spironolactone  12.5 mg Oral Daily    Family History    Family History  Problem Relation Age of Onset   Heart attack Mother    Hyperlipidemia Mother    CAD Other    Prostate cancer Neg Hx  Cancer Neg Hx    He indicated that his mother is deceased. He indicated that his father is deceased. He indicated that the  status of his neg hx is unknown. He indicated that the status of his other is unknown.   Social History    Social History   Socioeconomic History   Marital status: Married    Spouse name: Not on file   Number of children: 2   Years of education: College   Highest education level: Some college, no degree  Occupational History   Occupation: Retired  Tobacco Use   Smoking status: Former    Packs/day: 1.00    Years: 56.00    Pack years: 56.00    Types: Cigarettes    Quit date: 2011    Years since quitting: 11.9   Smokeless tobacco: Never  Vaping Use   Vaping Use: Never used  Substance and Sexual Activity   Alcohol use: No   Drug use: No   Sexual activity: Not on file  Other Topics Concern   Not on file  Social History Narrative   Drinks 2 cups of coffee a day    Social Determinants of Radio broadcast assistant Strain: Not on file  Food Insecurity: No Food Insecurity   Worried About Charity fundraiser in the Last Year: Never true   Arboriculturist in the Last Year: Never true  Transportation Needs: No Transportation Needs   Lack of Transportation (Medical): No   Lack of Transportation (Non-Medical): No  Physical Activity: Not on file  Stress: Not on file  Social Connections: Not on file  Intimate Partner Violence: Not on file     Review of Systems    General:  No chills, fever, night sweats or weight changes.  Cardiovascular:  +++ chest pain, +++ dyspnea on exertion, +++ lower extremity edema, +++ orthopnea overnight, no palpitations, paroxysmal nocturnal dyspnea. Dermatological: No rash, lesions/masses Respiratory: No cough, +++ dyspnea Urologic: No hematuria, dysuria Abdominal:   No nausea, vomiting, +++ diarrhea, no bright red blood per rectum, melena, or hematemesis Neurologic:  No visual changes, wkns, changes in mental status. All other systems reviewed and are otherwise negative except as noted above.  Physical Exam    Blood pressure 139/76, pulse  99, temperature (!) 96.3 F (35.7 C), temperature source Axillary, resp. rate (!) 23, SpO2 96 %.  General: Pleasant, NAD Psych: Flat affect. Neuro: Alert and oriented X 3. Moves all extremities spontaneously. HEENT: Normal  Neck: Supple.  Unable to fully assess neck/carotids/JVP as pt currently has BiPAP in place. Lungs:  Resp regular and unlabored, scattered rhonchi - BiPAP on. Heart: RRR no s3, s4, or murmurs. Abdomen: Soft, non-tender, non-distended, BS + x 4.  Extremities: No clubbing, cyanosis or edema. DP/PT2+, Radials 2+ and equal bilaterally.  Labs    Cardiac Enzymes Recent Labs  Lab 11/06/21 0413 12/04/21 0413 12/04/21 0806 12/04/21 1036 12/04/21 1246  TROPONINIHS 849* 47* 238* 314* 694*      Lab Results  Component Value Date   WBC 9.5 12/04/2021   HGB 11.2 (L) 12/04/2021   HCT 34.9 (L) 12/04/2021   MCV 86.2 12/04/2021   PLT 325 12/04/2021    Recent Labs  Lab 12/04/21 0413  NA 142  K 3.2*  CL 111  CO2 22  BUN 23  CREATININE 1.16  CALCIUM 9.2  PROT 7.7  BILITOT 0.7  ALKPHOS 58  ALT 14  AST 23  GLUCOSE 203*   Lab  Results  Component Value Date   CHOL 130 11/13/2020   HDL 43 11/13/2020   LDLCALC 63 11/13/2020   TRIG 137 11/13/2020    Radiology Studies    CT CHEST WO CONTRAST  Result Date: 12/04/2021 CLINICAL DATA:  Concern for infection EXAM: CT CHEST WITHOUT CONTRAST TECHNIQUE: Multidetector CT imaging of the chest was performed following the standard protocol without IV contrast. COMPARISON:  Multiple priors, most recent dated October 24, 2021 FINDINGS: Cardiovascular: Mild cardiomegaly. Trace pericardial effusion. Three-vessel coronary artery calcifications left chest wall dual lead pacer with leads in the right atrium and right ventricle. Mitral annular calcifications. Atherosclerotic disease of the thoracic aorta. Mediastinum/Nodes: Esophagus and thyroid are unremarkable. Stable right upper paratracheal lymph node measuring 1.0 cm on series 2  image 152 no pathologically enlarged lymph nodes seen in the chest. Lungs/Pleura: Central airways are patent. Increased bilateral dependent ground-glass opacities. Right lower lobe and paramediastinal consolidations with associated bronchiectasis, are similar to prior exam, although evaluation is limited due to presence of new pleural effusions and ground-glass opacities. Small right and trace left pleural effusions. Stable calcified nodule the right middle lobe measuring 2.0 cm and stable calcified nodule of the left lingula measuring 2.2 cm. Upper Abdomen: Cholecystectomy clips.  No acute abnormality. Musculoskeletal: No chest wall mass or suspicious bone lesions identified. IMPRESSION: 1. New small right and trace left pleural effusions. Increased bilateral dependent ground-glass opacities with background right lower lobe and right paramediastinal consolidations, findings are favored to represent atelectasis and pulmonary edema superimposed on background post radiation changes. Recommend follow-up after resolution of acute symptoms to better assess post treatment changes. 2. Stable solid partially calcified nodules of the right middle lobe and lingula. 3. Three-vessel coronary artery calcifications. 4. Aortic Atherosclerosis (ICD10-I70.0). Electronically Signed   By: Yetta Glassman M.D.   On: 12/04/2021 09:22   US RENAL  Result Date: 11/06/2021 CLINICAL DATA:  An area. EXAM: RENAL / URINARY TRACT ULTRASOUND COMPLETE COMPARISON:  February 23, 2014 FINDINGS: Right Kidney: Renal measurements: 11.7 cm x 4.1 cm x 4.2 cm = volume: 106 mL. Diffusely increased echogenicity of the renal parenchyma is noted. No mass or hydronephrosis visualized. Left Kidney: Renal measurements: 12.1 cm x 6.1 cm x 5.5 cm = volume: 212 mL. Echogenicity within normal limits. A 2.7 cm x 1.5 cm x 2.2 cm well-defined, anechoic structure is seen within the lower pole of the left kidney. No abnormal flow is seen within this region on color  Doppler evaluation. No hydronephrosis is visualized. Bladder: Appears normal for degree of bladder distention. Other: A mild amount of right-sided perirenal fluid is noted. IMPRESSION: 1. Increased echogenicity of the right kidney and perirenal fluid, which may be secondary to an infectious or inflammatory process such as acute pyelonephritis. Correlation with urinalysis is recommended. 2. Simple left renal cyst. No additional follow-up or imaging is recommended. Electronically Signed   By: Virgina Norfolk M.D.   On: 11/06/2021 16:41   DG Chest Portable 1 View  Result Date: 12/04/2021 CLINICAL DATA:  Respiratory distress and chest pain. History of lung cancer. EXAM: PORTABLE CHEST 1 VIEW COMPARISON:  Portable chest 11/05/2021 FINDINGS: The cardiac size is normal. There is patchy calcification throughout the thoracic aorta. Left chest dual lead pacing system and wire insertions are stable. The central vessels are normal in caliber. On the left there are scattered linear scar-like opacities in the lower lung zone but no evidence of focal pneumonia. On the right there is increased upper lobe opacity in the  mid perihilar area, small increased right pleural effusion and additional increased opacity in the medial basal right lung, findings concerning for multilobar pneumonia. The left sulci are sharp. IMPRESSION: Increased opacities in the right lung concerning for multilobar pneumonia. Clinical correlation and radiographic follow-up recommended. Small right pleural effusion. Electronically Signed   By: Telford Nab M.D.   On: 12/04/2021 04:44   DG Chest Portable 1 View  Result Date: 11/05/2021 CLINICAL DATA:  Dyspnea EXAM: PORTABLE CHEST 1 VIEW COMPARISON:  10/28/2021 FINDINGS: Lung volumes are small. Developing left basilar atelectasis or infiltrate. Stable subtle infiltrate within the right mid lung zone. No pneumothorax or pleural effusion. Cardiac size is within normal limits. Left subclavian dual lead  pacemaker is unchanged. Pulmonary vascularity is normal. No acute bone abnormality. IMPRESSION: Pulmonary hypoinflation. Developing left basilar atelectasis or infiltrate. Stable right mid lung zone subtle pulmonary infiltrate. Electronically Signed   By: Fidela Salisbury M.D.   On: 11/05/2021 03:34    ECG & Cardiac Imaging    V paced, 116 - personally reviewed.  Assessment & Plan    1.  Non-STEMI/nonobstructive CAD: Patient presents for the third time since early November with chest pain, dyspnea, and non-STEMI.  Troponin currently 694.  He developed chest discomfort prior to going to bed last night was subsequently resolved but then woke up in the early morning hours with unrelenting dyspnea prompting his wife to call EMS.  He is currently on BiPAP and denies chest pain.  ECG is paced.  Discussion with patient and family today.  Last cath in April 2022 with moderate nonobstructive disease and with each prior hospitalization, decision was made to continue conservative management.  With a third admission, we will need to decide to either pursue diagnostic catheterization we will potentially consider palliative care.  Patient is chronically anticoagulated with apixaban in the setting of prior splenic infarct.  I will hold this and initiate heparin in case he opts for diagnostic catheterization.  Otherwise continue aspirin, beta-blocker, nitrate, and statin therapy.  2.  Acute on chronic combined systolic and diastolic congestive heart failure/nonischemic cardiomyopathy: EF 20 to 25% by echo in November.  Patient with 7 pound weight gain in the past week or so.  He also noted increasing lower extremity swelling.  Admitted with what seems to be acute pulmonary edema and respiratory failure overnight.  BNP greater than 4500.  Agree with IV diuresis and he is currently on Lasix 40 mg IV daily.  Continue beta-blocker, Entresto, and spironolactone.  Will consider SGLT2 inhibitor.  3.  Essential hypertension:  Stable.  4.  Hyperlipidemia: Continue statin therapy.  LDL 63 in November.  5.  Type 2 diabetes mellitus: Insulin management per primary team.  6.  Chronic hypoxic respiratory failure on home O2: Nebulizer and antibiotic management per primary team.  7.  Diarrhea: Recent significant diarrhea during November hospitalization and subsequently outpatient setting.  Followed by GI as an outpatient.  Not felt to be a suitable candidate for colonoscopy.  We will have to watch closely for electrolyte disturbances and dehydration if he is to remain on antibiotics.  Signed, Murray Hodgkins, NP 12/04/2021, 4:20 PM  For questions or updates, please contact   Please consult [email or web address] for contact info under Cardiology/STEMI.

## 2021-12-04 NOTE — ED Notes (Signed)
End, MD at bedside.

## 2021-12-04 NOTE — ED Provider Notes (Signed)
[facility]  ____________________________________________   Event Date/Time   First MD Initiated Contact with Patient 12/04/21 0404     (approximate)  I have reviewed the triage vital signs and the nursing notes.   HISTORY  Chief Complaint Respiratory Distress    HPI Howard [name] is a 87 y.o. male HFrEF 20-25%, chronic combined diastolic and systolic CHF, type 2 diabetes, GERD, hypertension, hyperlipidemia, non-small cell lung cancer s/p radiation, chronic respiratory failure on 3 L nasal cannula continuously, emphysema, complete heart block status post pacemaker placement, CAD who presents with respiratory distress.  Patient was feeling overall in his normal state of health until last night he became acutely short of breath.  His wife notes that around 9 PM he did complain of some chest pain and asked for nitroglycerin but this was short-lived.  Patient has not had cough fevers or chills.  He has no longer having shortness of breath.  Patient's wife did note that he had gained some weight when he was at his doctor's office just yesterday.  About 7 pounds.  She has also noted some swelling in his legs.  Patient denies abdominal pain nausea vomiting.           Past Medical History:  Diagnosis Date   Arthritis    Bell palsy    Bell's palsy 04/12/2015   Cancer [facility])    prostate and skin   Chronic combined systolic and diastolic CHF, NYHA class 1 ([place])    a. 07/2014 Echo: EF 35-40%, Gr 1 DD.   Complete heart block ([place])    a. 11/2010 s/p SJM 2210 [employer] DC PPM, ser# [id number].   Depression    Diabetes mellitus without complication ([place])    Fall 11-10-14   GERD (gastroesophageal reflux disease)    History of prostate cancer    Hyperlipidemia    Hypertension    LBBB (left bundle branch block)    Left-sided Bell's palsy    Lung cancer ([place]) 2016   NICM (nonischemic cardiomyopathy) ([place])    a. 07/2014 Echo: EF 35-40%, mid-apicalanteroseptal DK, Gr 1 DD,  mild-mod dil LA.   Non-obstructive CAD    a. 07/2014 Abnl MV;  b. 08/2014 Cath: LM nl, LAD 30p, RI 40p, LCX nl, OM1 40, RCA dominant 30p, 70d-->Med Rx.   Poor balance    Presence of permanent cardiac pacemaker    Sleep apnea    a. cpap   Vertigo    WPW (Wolff-Parkinson-White syndrome)    a. S/P RFCA 1991.    Patient Active Problem List   Diagnosis Date Noted   Acute CHF (congestive heart failure) ([place]) 12/04/2021   NSTEMI (non-ST elevated myocardial infarction) ([place]) 11/05/2021   CKD stage 3 due to type 2 diabetes mellitus ([place]) 11/05/2021   Abnormal CXR    Pulmonary infiltrate in right lung on CXR    Hypoxia    Acute on chronic HFrEF (heart failure with reduced ejection fraction) ([place]) 10/24/2021   Acute respiratory failure ([place])    Normocytic anemia 09/18/2021   Other fatigue 82/95/6213   Chronic systolic congestive heart failure ([place]) 07/18/2021   Goals of care, counseling/discussion 04/16/2021   Mediastinal lymphadenopathy 04/16/2021   Heart failure with reduced ejection fraction ([place])    AAA (abdominal aortic aneurysm) without rupture 12/10/2019   Renal artery stenosis ([place]) 12/10/2019   PAD (peripheral artery disease) ([place]) 12/10/2019   Thromboembolism ([place]) 08/30/2019   Atherosclerosis of native arteries of extremity with intermittent claudication ([place]) 08/23/2019  Swelling of limb 08/17/2019   Pain in limb 08/17/2019   NICM (nonischemic cardiomyopathy) ([place]) 07/01/2019   CHF (congestive heart failure) ([place]) 07/01/2019   Malignant neoplasm of right lung ([place]) 04/18/2016   CAD (coronary artery disease) 04/01/2016   Adenocarcinoma ([place]) 04/01/2016   Skin cyst 12/26/2015   GERD (gastroesophageal reflux disease) 06/16/2015   Abscess of back 06/06/2015   Vertigo 03/27/2015   Carpal tunnel syndrome 10/28/2014   Status post cholecystectomy 09/29/2014   Disease of digestive tract 09/29/2014   Cardiomyopathy ([place]) 09/26/2014   Type 2 diabetes mellitus without  complications ([place])    Sleep apnea    Spinal stenosis, lumbar region, with neurogenic claudication 06/07/2014   Lumbar stenosis with neurogenic claudication 06/07/2014   Abnormal gait 08/20/2012   H/O total knee replacement 08/20/2012   Arthritis of knee, degenerative 08/20/2012   Pacemaker-St.Jude 08/03/2012   Cardiac conduction disorder 06/19/2012   Acid reflux 06/18/2012   Nodal rhythm disorder 06/18/2012   Triggering of digit 03/25/2012   Hyperlipidemia 12/23/2011   Essential hypertension 03/23/2011   Atrioventricular block, complete ([place]) 03/23/2011   Complete atrioventricular block ([place]) 03/23/2011    Past Surgical History:  Procedure Laterality Date   ABDOMINAL AORTIC ENDOVASCULAR STENT GRAFT  08/25/2019   Procedure: ABDOMINAL AORTIC ENDOVASCULAR STENT GRAFT;  Surgeon: Algernon Huxley, MD;  Location: [facility] ORS;  Service: Vascular;;   ANGIOPLASTY Left 08/25/2019   Procedure: ANGIOPLASTY;  Surgeon: Algernon Huxley, MD;  Location: [facility] ORS;  Service: Vascular;  Laterality: Left;  left SFA and stent placement   APPLICATION OF WOUND VAC Left 06/07/2015   Procedure: APPLICATION OF WOUND VAC;  Surgeon: Robert Bellow, MD;  Location: [facility] ORS;  Service: General;  Laterality: Left;  left upper back   [place]  08/26/2014   Single vessel obstructive CAD   CARPAL TUNNEL RELEASE  04-04-15   [facility]   CATARACT EXTRACTION  07-31-11 and 09-18-11   Catheter ablation  1991   for WPW   cervical fusion     CHOLECYSTECTOMY  09-07-14   ENDARTERECTOMY FEMORAL Left 08/25/2019   Procedure: ENDARTERECTOMY FEMORAL;  Surgeon: Algernon Huxley, MD;  Location: [facility] ORS;  Service: Vascular;  Laterality: Left;  common and produndis    ENDOVASCULAR REPAIR/STENT GRAFT Right 08/25/2019   Procedure: ENDOVASCULAR REPAIR/STENT GRAFT;  Surgeon: Algernon Huxley, MD;  Location: [facility] ORS;  Service: Vascular;  Laterality: Right;  renal artery   HAND SURGERY     right 1993; left 2005   [place] / REPLACE / REMOVE PACEMAKER     INSERTION OF ILIAC STENT Bilateral 08/25/2019   Procedure: INSERTION OF ILIAC STENT;  Surgeon: Algernon Huxley, MD;  Location: [facility] ORS;  Service: Vascular;  Laterality: Bilateral;   JOINT REPLACEMENT Left 2013   knee   JOINT REPLACEMENT Right 2004   knee   KNEE SURGERY     left knee 1991 and 1992; right knee [place] N/A 08/26/2014   Procedure: LEFT HEART CATHETERIZATION WITH CORONARY ANGIOGRAM;  Surgeon: [name], MD;  Location: [facility] CATH LAB;  Service: Cardiovascular;  Laterality: N/A;   LOWER EXTREMITY ANGIOGRAPHY Left 08/23/2019   Procedure: Lower Extremity Angiography;  Surgeon: Algernon Huxley, MD;  Location: [place] CV LAB;  Service: Cardiovascular;  Laterality: Left;   LUMBAR LAMINECTOMY/DECOMPRESSION MICRODISCECTOMY N/A 06/07/2014   Procedure: LUMBAR FOUR TO FIVE  LUMBAR LAMINECTOMY/DECOMPRESSION MICRODISCECTOMY 1 LEVEL;  Surgeon: [name], MD;  Location: [place] NEURO ORS;  Service: Neurosurgery;  Laterality: N/A;   LUNG BIOPSY Right 2016   Dr [name]   MOHS SURGERY     PACEMAKER INSERTION     PPM-- St Jude 11/30/10 by [name]   PPM GENERATOR CHANGEOUT N/A 07/09/2019   Procedure: PPM GENERATOR CHANGEOUT;  Surgeon: Deboraha Sprang, MD;  Location: [place] CV LAB;  Service: Cardiovascular;  Laterality: N/A;   PROSTATE SURGERY     cancer--1998, prostatectomy   REPLACEMENT TOTAL KNEE     2004   RIGHT/LEFT HEART CATH AND CORONARY ANGIOGRAPHY Bilateral 04/13/2021   Procedure: RIGHT/LEFT HEART CATH AND CORONARY ANGIOGRAPHY;  Surgeon: Wellington Hampshire, MD;  Location: [place] CV LAB;  Service: Cardiovascular;  Laterality: Bilateral;   ruptured disc     1962 and 1998   TEE WITHOUT CARDIOVERSION N/A 09/01/2019   Procedure: TRANSESOPHAGEAL ECHOCARDIOGRAM (TEE);  Surgeon: [name], MD;  Location: [facility] ORS;  Service: Cardiovascular;  Laterality: N/A;   TEMPORARY PACEMAKER N/A 07/09/2019    Procedure: TEMPORARY PACEMAKER;  Surgeon: Deboraha Sprang, MD;  Location: [place] CV LAB;  Service: Cardiovascular;  Laterality: N/A;   TRIGGER FINGER RELEASE  01-24-15   WOUND DEBRIDEMENT Left 06/07/2015   Procedure: DEBRIDEMENT WOUND;  Surgeon: Robert Bellow, MD;  Location: [facility] ORS;  Service: General;  Laterality: Left;  left upper back    Prior to Admission medications   Medication Sig Start Date End Date Taking? Authorizing Provider  acetaminophen (TYLENOL) 500 MG tablet Take 1,000 mg by mouth every 6 (six) hours as needed for moderate pain.    [provider]  albuterol (VENTOLIN HFA) 108 (90 Base) MCG/ACT inhaler INHALE 2 PUFFS INTO LUNGS EVERY 6 HOURS AS NEEDED FOR WHEEZING OR SHORTNESS OF BREATH 07/23/21   Tyler Pita, MD  apixaban (ELIQUIS) 5 MG TABS tablet Take 5 mg by mouth 2 (two) times daily.    [provider]  aspirin EC 81 MG tablet Take 81 mg by mouth daily.    [provider]  cetirizine (ZYRTEC) 10 MG tablet Take 10 mg by mouth daily.    [provider]  Cholecalciferol (VITAMIN D3) 1000 UNITS CAPS Take 1,000 Units by mouth daily.    [provider]  diphenoxylate-atropine (LOMOTIL) 2.5-0.025 MG tablet Take 1 to 2 tablet by mouth daily as needed for diarrhea 11/22/21   Lucilla Lame, MD  donepezil (ARICEPT) 5 MG tablet Take 1 tablet (5 mg total) by mouth daily. 10/23/21   Jerrol Banana., MD  ezetimibe (ZETIA) 10 MG tablet TAKE 1 TABLET DAILY 09/24/21   [name], MD  Fluticasone-Umeclidin-Vilant (TRELEGY ELLIPTA) 100-62.5-25 MCG/INH AEPB Inhale 1 puff into the lungs daily. 02/14/21   Tyler Pita, MD  furosemide (LASIX) 20 MG tablet TAKE 1 TABLET BY MOUTH ONCE DAILY 10/09/21   [name], MD  glipiZIDE (GLUCOTROL XL) 5 MG 24 hr tablet Take 1 tablet (5 mg total) by mouth daily with breakfast. 12/03/21   Birdie Sons, MD  Iron-Vitamin C (VITRON-C) 65-125 MG TABS Take 1 tablet by mouth daily.  04/16/21   Earlie Server, MD  isosorbide mononitrate (IMDUR) 60 MG 24 hr tablet Take 1 tablet (60 mg total) by mouth daily. 11/10/21   Elgergawy, Silver Huguenin, MD  megestrol (MEGACE) 40 MG tablet Take 1 tablet (40 mg total) by mouth 2 (two) times daily. 09/18/21   Tasia Catchings,  Talbert Cage, MD  metFORMIN (GLUCOPHAGE) 500 MG tablet Take 1 tablet (500 mg total) by mouth 2 (two) times daily with a meal. (PUT ON HOLD 12/03/2021) Patient not taking: Reported on 12/03/2021 12/03/21   Birdie Sons, MD  metoprolol succinate (TOPROL-XL) 25 MG 24 hr tablet TAKE 1 TABLET AT BEDTIME 09/24/21   Gollan, Kathlene November, MD  montelukast (SINGULAIR) 10 MG tablet Take 10 mg by mouth daily as needed (sneezing/allergies.).    [provider]  Multiple Vitamin (MULTIVITAMIN WITH MINERALS) TABS tablet Take 1 tablet by mouth daily. One-A-Day Multivitamin    [provider]  nitroGLYCERIN (NITROSTAT) 0.4 MG SL tablet PLACE 1 TABLET UNDER TONGUE EVERY 5 MIN AS NEEDED FOR CHEST PAIN IF NO RELIEF IN15 MIN CALL 911 (MAX 2 TABS) 11/19/21   Gollan, Kathlene November, MD  omeprazole (PRILOSEC) 40 MG capsule TAKE 1 CAPSULE TWICE DAILY AS NEEDED 02/12/21   Chrismon, [name], PA-C  rifaximin (XIFAXAN) 550 MG TABS tablet Take 1 tablet (550 mg total) by mouth 3 (three) times daily. Patient not taking: Reported on 12/03/2021 12/03/21   Birdie Sons, MD  sertraline (ZOLOFT) 50 MG tablet Take 1 tablet (50 mg total) by mouth daily. 10/23/21   Jerrol Banana., MD  simvastatin (ZOCOR) 40 MG tablet TAKE 1 TABLET AT BEDTIME 03/23/21   [name], MD  SODIUM BICARBONATE PO Take 1 tablet by mouth 3 (three) times daily.    [provider]  spironolactone (ALDACTONE) 25 MG tablet Take 0.5 tablets (12.5 mg total) by mouth daily. 11/09/21 11/04/22  Elgergawy, Silver Huguenin, MD    Allergies Sulfa antibiotics  Family History  Problem Relation Age of Onset   Heart attack Mother    Hyperlipidemia Mother    CAD Other    Prostate cancer Neg Hx     Cancer Neg Hx     Social History Social History   Tobacco Use   Smoking status: Former    Packs/day: 1.00    Years: 56.00    Pack years: 56.00    Types: Cigarettes    Quit date: 2011    Years since quitting: 11.9   Smokeless tobacco: Never  Vaping Use   Vaping Use: Never used  Substance Use Topics   Alcohol use: No   Drug use: No    Review of Systems   Review of Systems  Constitutional:  Negative for chills and fever.  Respiratory:  Positive for shortness of breath. Negative for cough and chest tightness.   Cardiovascular:  Negative for chest pain.  Gastrointestinal:  Negative for abdominal pain, nausea and vomiting.  All other systems reviewed and are negative.  Physical Exam Updated Vital Signs BP (!) 115/56   Pulse 78   Temp (!) 96.3 F (35.7 C) (Axillary)   Resp (!) 24   SpO2 98%   Physical Exam Vitals and nursing note reviewed.  Constitutional:      General: He is in acute distress.     Appearance: Normal appearance.     Comments: Patient is in significant respiratory distress, using accessory muscles, expiratory wheezing and rales  HENT:     Head: Normocephalic and atraumatic.  Eyes:     General: No scleral icterus.    Conjunctiva/sclera: Conjunctivae normal.  Cardiovascular:     Rate and Rhythm: Regular rhythm. Tachycardia present.  Pulmonary:     Effort: Pulmonary effort is normal. No respiratory distress.     Breath sounds: Normal breath sounds. No wheezing.  Abdominal:     General: Abdomen is flat. There is no distension.     Palpations: Abdomen is soft.     Tenderness: There is no abdominal tenderness. There is no guarding.  Musculoskeletal:        General: No deformity or signs of injury.     Cervical back: Normal range of motion.     Right lower leg: Edema present.     Left lower leg: Edema present.     Comments: 1+ lower extremity edema bilaterally  Skin:    Coloration: Skin is not jaundiced or pale.  Neurological:     General: No  focal deficit present.     Mental Status: He is alert and oriented to person, place, and time. Mental status is at baseline.  Psychiatric:        Mood and Affect: Mood normal.        Behavior: Behavior normal.     LABS (all labs ordered are listed, but only abnormal results are displayed)  Labs Reviewed  COMPREHENSIVE METABOLIC PANEL - Abnormal; Notable for the following components:      Result Value   Potassium 3.2 (*)    Glucose, Bld 203 (*)    All other components within normal limits  CBC WITH DIFFERENTIAL/PLATELET - Abnormal; Notable for the following components:   RBC 4.05 (*)    Hemoglobin 11.2 (*)    HCT 34.9 (*)    RDW 16.7 (*)    All other components within normal limits  BLOOD GAS, VENOUS - Abnormal; Notable for the following components:   pCO2, Ven 39 (*)    pO2, Ven 54.0 (*)    Acid-base deficit 5.6 (*)    All other components within normal limits  BRAIN NATRIURETIC PEPTIDE - Abnormal; Notable for the following components:   B Natriuretic Peptide >4,500.0 (*)    All other components within normal limits  TROPONIN I (HIGH SENSITIVITY) - Abnormal; Notable for the following components:   Troponin I (High Sensitivity) 47 (*)    All other components within normal limits  RESP PANEL BY RT-PCR (FLU A&B, COVID) ARPGX2  CULTURE, BLOOD (ROUTINE X 2)  CULTURE, BLOOD (ROUTINE X 2)  PROCALCITONIN  LACTIC ACID, PLASMA  LACTIC ACID, PLASMA  TROPONIN I (HIGH SENSITIVITY)   ____________________________________________  EKG Ventricular paced rhythm, left axis deviation, no concordant ST elevation or excessive discordant ST elevation  ____________________________________________  RADIOLOGY I, Madelin Headings, personally viewed and evaluated these images (plain radiographs) as part of my medical decision making, as well as reviewing the written report by the radiologist.  ED MD interpretation: Reviewed the x-ray of the chest which does show increased opacity in the right  lung, questionable pulmonary edema versus infiltrate    ____________________________________________   PROCEDURES  Procedure(s) performed (including Critical Care):  .Critical Care Performed by: Rada Hay, MD Authorized by: Rada Hay, MD   Critical care provider statement:    Critical care time (minutes):  30   Critical care was time spent personally by me on the following activities:  Development of treatment plan with patient or surrogate, discussions with consultants, evaluation of patient's response to treatment, examination of patient, ordering and review of laboratory studies, ordering and review of radiographic studies, ordering and performing treatments and interventions, pulse oximetry, re-evaluation of patient's condition and review of old charts   ____________________________________________   INITIAL IMPRESSION / [place] / ED COURSE   Patient is an 86 year old male with history of  COPD and heart failure as well as lung cancer who presents with shortness of breath.  Symptoms started acutely overnight.  On arrival he is in respiratory distress, sounds wet on lung exam with some wheezing and rhonchi.  Apparently he was in the mid 80s and improved to 90s on 15 L nasal cannula.  He was placed on BiPAP for his work of breathing and satting normally.  Blood pressure within normal limits, mildly tachycardic.  EKG shows a paced rhythm without acute ischemic changes.  Troponin is mildly elevated at 47, has been always elevated in the past as well.  Patient did have some chest pain earlier but this is now resolved.  His x-ray is being read by radiology as a likely multifocal pneumonia however I am more concerned for pulmonary edema given the acute onset another lack of infectious symptoms.  Will cover with ceftriaxone and is a throw and send blood cultures and a lactate and a Pro-Cal however again I feel that this is less likely to be multifocal pneumonia.   BNP is pending.   ____________________________________________   FINAL CLINICAL IMPRESSION(S) / ED DIAGNOSES  Final diagnoses:  None     ED Discharge Orders     None        Note:  This document was prepared using Dragon voice recognition software and may include unintentional dictation errors.    Rada Hay, MD 12/04/21 [phone]

## 2021-12-04 NOTE — ED Notes (Signed)
Patient resting comfortably in stretcher with family at bedside.

## 2021-12-04 NOTE — ED Notes (Addendum)
Patient placed on 4L [place] to trial off bi-pap at this time. Ginger ale given at this time.

## 2021-12-05 ENCOUNTER — Inpatient Hospital Stay: Payer: Medicare Other | Admitting: Family Medicine

## 2021-12-05 LAB — BASIC METABOLIC PANEL
Anion gap: 9 (ref 5–15)
BUN: 20 mg/dL (ref 8–23)
CO2: 21 mmol/L — ABNORMAL LOW (ref 22–32)
Calcium: 8.7 mg/dL — ABNORMAL LOW (ref 8.9–10.3)
Chloride: 110 mmol/L (ref 98–111)
Creatinine, Ser: 0.99 mg/dL (ref 0.61–1.24)
GFR, Estimated: >60 mL/min (ref >60)
Glucose, Bld: 143 mg/dL — ABNORMAL HIGH (ref 70–99)
Potassium: 3 mmol/L — ABNORMAL LOW (ref 3.5–5.1)
Sodium: 140 mmol/L (ref 135–145)

## 2021-12-05 LAB — CBC
HCT: 30.3 % — ABNORMAL LOW (ref 39.0–52.0)
Hemoglobin: 10 g/dL — ABNORMAL LOW (ref 13.0–17.0)
MCH: 28.2 pg (ref 26.0–34.0)
MCHC: 33 g/dL (ref 30.0–36.0)
MCV: 85.4 fL (ref 80.0–100.0)
Platelets: 248 10*3/uL (ref 150–400)
RBC: 3.55 MIL/uL — ABNORMAL LOW (ref 4.22–5.81)
RDW: 16.5 % — ABNORMAL HIGH (ref 11.5–15.5)
WBC: 6.4 10*3/uL (ref 4.0–10.5)
nRBC: 0 % (ref 0.0–0.2)

## 2021-12-05 LAB — GLUCOSE, CAPILLARY
Glucose-Capillary: 138 mg/dL — ABNORMAL HIGH (ref 70–99)
Glucose-Capillary: 189 mg/dL — ABNORMAL HIGH (ref 70–99)
Glucose-Capillary: 128 mg/dL — ABNORMAL HIGH (ref 70–99)
Glucose-Capillary: 138 mg/dL — ABNORMAL HIGH (ref 70–99)
Glucose-Capillary: 112 mg/dL — ABNORMAL HIGH (ref 70–99)

## 2021-12-05 MED ORDER — FUROSEMIDE 10 MG/ML IJ SOLN
40.0000 mg | Freq: Three times a day (TID) | INTRAMUSCULAR | Status: DC
  Administered 2021-12-05 – 2021-12-08 (×9): 40 mg via INTRAVENOUS
  Filled 2021-12-05 (×9): qty 4

## 2021-12-05 MED ORDER — POTASSIUM CHLORIDE CRYS ER 20 MEQ PO TBCR
20.0000 meq | EXTENDED_RELEASE_TABLET | Freq: Two times a day (BID) | ORAL | Status: DC
  Administered 2021-12-05 – 2021-12-08 (×7): 20 meq via ORAL
  Filled 2021-12-05 (×7): qty 1

## 2021-12-05 NOTE — Progress Notes (Signed)
Progress Note  Patient Name: Howard [name] Date of Encounter: 12/05/2021  [facility] Cardiologist: [name], MD   Subjective   Still short of breath although improving.  Inpatient Medications    Scheduled Meds:  apixaban  5 mg Oral BID   ascorbic acid  500 mg Oral Daily   aspirin EC  81 mg Oral Daily   budesonide (PULMICORT) nebulizer solution  0.5 mg Nebulization BID   cholecalciferol  1,000 Units Oral Daily   donepezil  5 mg Oral Daily   furosemide  40 mg Intravenous TID   insulin aspart  0-15 Units Subcutaneous TID WC   isosorbide mononitrate  60 mg Oral Daily   loratadine  10 mg Oral Daily   megestrol  40 mg Oral BID   metoprolol succinate  25 mg Oral QHS   multivitamin with minerals  1 tablet Oral Daily   potassium chloride  20 mEq Oral BID   rifaximin  550 mg Oral TID   sacubitril-valsartan  1 tablet Oral BID   sertraline  50 mg Oral QHS   simvastatin  40 mg Oral QHS   sodium chloride flush  3 mL Intravenous Q12H   spironolactone  12.5 mg Oral Daily   Continuous Infusions:  sodium chloride     PRN Meds: sodium chloride, acetaminophen, albuterol, diphenoxylate-atropine, nitroGLYCERIN, ondansetron **OR** ondansetron (ZOFRAN) IV, sodium chloride flush   Vital Signs    Vitals:   12/05/21 0753 12/05/21 0813 12/05/21 1110 12/05/21 1605  BP: 132/76  137/80 123/63  Pulse:   96 92  Resp: 18  18 18   Temp: 97.6 F (36.4 C)  (!) 97.4 F (36.3 C) 98.1 F (36.7 C)  TempSrc: Oral     SpO2: 97% 97% 95% 95%  Weight:      Height:        Intake/Output Summary (Last 24 hours) at 12/05/2021 1610 Last data filed at 12/05/2021 1418 Gross per 24 hour  Intake 960 ml  Output 1000 ml  Net -40 ml   Last 3 Weights 12/05/2021 12/03/2021 12/03/2021  Weight (lbs) 180 lb 4.8 oz 187 lb 187 lb  Weight (kg) 81.784 kg 84.823 kg 84.823 kg      Telemetry    V paced rhythm heart rate 70- Personally Reviewed  ECG     - Personally Reviewed  Physical Exam    GEN: Mild respiratory distress with increased work of breathing Neck: No JVD Cardiac: Rhonchorous breath sounds Respiratory: Clear to auscultation bilaterally. GI: Soft, nontender, distended  MS: No edema; No deformity. Neuro:  Nonfocal  Psych: Normal affect   Labs    High Sensitivity Troponin:   Recent Labs  Lab 11/06/21 0413 12/04/21 0413 12/04/21 0806 12/04/21 1036 12/04/21 1246  TROPONINIHS 849* 47* 238* 314* 694*     Chemistry Recent Labs  Lab 12/04/21 0413 12/05/21 0729  NA 142 140  K 3.2* 3.0*  CL 111 110  CO2 22 21*  GLUCOSE 203* 143*  BUN 23 20  CREATININE 1.16 0.99  CALCIUM 9.2 8.7*  PROT 7.7  --   ALBUMIN 3.7  --   AST 23  --   ALT 14  --   ALKPHOS 58  --   BILITOT 0.7  --   GFRNONAA >60 >60  ANIONGAP 9 9    Lipids No results for input(s): CHOL, TRIG, HDL, LABVLDL, LDLCALC, CHOLHDL in the last 168 hours.  Hematology Recent Labs  Lab 12/04/21 0413 12/05/21 0729  WBC 9.5 6.4  RBC 4.05* 3.55*  HGB 11.2* 10.0*  HCT 34.9* 30.3*  MCV 86.2 85.4  MCH 27.7 28.2  MCHC 32.1 33.0  RDW 16.7* 16.5*  PLT 325 248   Thyroid No results for input(s): TSH, FREET4 in the last 168 hours.  BNP Recent Labs  Lab 12/04/21 0416  BNP >4,500.0*    DDimer No results for input(s): DDIMER in the last 168 hours.   Radiology    CT CHEST WO CONTRAST  Result Date: 12/04/2021 CLINICAL DATA:  Concern for infection EXAM: CT CHEST WITHOUT CONTRAST TECHNIQUE: Multidetector CT imaging of the chest was performed following the standard protocol without IV contrast. COMPARISON:  Multiple priors, most recent dated October 24, 2021 FINDINGS: Cardiovascular: Mild cardiomegaly. Trace pericardial effusion. Three-vessel coronary artery calcifications left chest wall dual lead pacer with leads in the right atrium and right ventricle. Mitral annular calcifications. Atherosclerotic disease of the thoracic aorta. Mediastinum/Nodes: Esophagus and thyroid are unremarkable. Stable right  upper paratracheal lymph node measuring 1.0 cm on series 2 image 152 no pathologically enlarged lymph nodes seen in the chest. Lungs/Pleura: Central airways are patent. Increased bilateral dependent ground-glass opacities. Right lower lobe and paramediastinal consolidations with associated bronchiectasis, are similar to prior exam, although evaluation is limited due to presence of new pleural effusions and ground-glass opacities. Small right and trace left pleural effusions. Stable calcified nodule the right middle lobe measuring 2.0 cm and stable calcified nodule of the left lingula measuring 2.2 cm. Upper Abdomen: Cholecystectomy clips.  No acute abnormality. Musculoskeletal: No chest wall mass or suspicious bone lesions identified. IMPRESSION: 1. New small right and trace left pleural effusions. Increased bilateral dependent ground-glass opacities with background right lower lobe and right paramediastinal consolidations, findings are favored to represent atelectasis and pulmonary edema superimposed on background post radiation changes. Recommend follow-up after resolution of acute symptoms to better assess post treatment changes. 2. Stable solid partially calcified nodules of the right middle lobe and lingula. 3. Three-vessel coronary artery calcifications. 4. Aortic Atherosclerosis (ICD10-I70.0). Electronically Signed   By: Yetta Glassman M.D.   On: 12/04/2021 09:22   DG Chest Portable 1 View  Result Date: 12/04/2021 CLINICAL DATA:  Respiratory distress and chest pain. History of lung cancer. EXAM: PORTABLE CHEST 1 VIEW COMPARISON:  Portable chest 11/05/2021 FINDINGS: The cardiac size is normal. There is patchy calcification throughout the thoracic aorta. Left chest dual lead pacing system and wire insertions are stable. The central vessels are normal in caliber. On the left there are scattered linear scar-like opacities in the lower lung zone but no evidence of focal pneumonia. On the right there is  increased upper lobe opacity in the mid perihilar area, small increased right pleural effusion and additional increased opacity in the medial basal right lung, findings concerning for multilobar pneumonia. The left sulci are sharp. IMPRESSION: Increased opacities in the right lung concerning for multilobar pneumonia. Clinical correlation and radiographic follow-up recommended. Small right pleural effusion. Electronically Signed   By: Telford Nab M.D.   On: 12/04/2021 04:44    Cardiac Studies   Last EF 20 to 25%  Patient Profile     87 y.o. male history of HFrEF EF 25%, splenic infarct on Eliquis, presenting with shortness of breath diagnosed with acute heart failure.  Patient with frequent CHF admissions.  Started on IV Lasix with improvement in shortness of breath.    Assessment & Plan    HFrEF EF 25% -Increase Lasix to 40 mg 3 times daily. -Monitor ins and outs,  BP, creatinine. -Restart PTA Toprol-XL, Entresto, Aldactone. -Frequent CHF admissions, third time over the past 6 weeks.  May need higher dose of diuretics upon discharge.  2.  History of splenic infarct, -On Eliquis   Total encounter time 35 minutes  Greater than 50% was spent in counseling and coordination of care with the patient     Signed, Kate Sable, MD  12/05/2021, 4:10 PM

## 2021-12-05 NOTE — Progress Notes (Signed)
TOC consulted for heart failure screen. Heart failure RN Delmar Landau has been informed.    [place], [place]

## 2021-12-05 NOTE — Progress Notes (Signed)
Assessed patient for need of continued bipap use. Patient states he does not wear cpap/bipap at home and no history of sleep apnea. No indication for need at this time

## 2021-12-05 NOTE — TOC Initial Note (Signed)
Transition of Care [facility]) - Initial/Assessment Note    Patient Details  Name: Howard [name] MRN: [id number] Date of Birth: [birth year]  [facility]) CM/SW Contact:    Alberteen Sam, LCSW Phone Number: 12/05/2021, 10:27 AM  Clinical Narrative:                  Patient known to this CSW from last admission with discharge 11/18. Patient active with [facility] services. Last admission identified no DME needs as wife reported having rollator at home.   TOC will follow for discharge planning needs pending patient's medical course this admission.    Expected Discharge Plan: [place] Barriers to Discharge: Continued Medical Work up   Patient Goals and CMS Choice   CMS Medicare.gov Compare Post Acute Care list provided to:: Patient Represenative (must comment) (spouse) Choice offered to / list presented to : Spouse  Expected Discharge Plan and Services Expected Discharge Plan: [place]       Living arrangements for the past 2 months: [place] Agency: [place] ([place])        Prior Living Arrangements/Services Living arrangements for the past 2 months: Single Family Home Lives with:: Spouse   Do you feel safe going back to the place where you live?: Yes               Activities of Daily Living Home Assistive Devices/Equipment: Dentures (specify type), Oxygen, Walker (specify type), Blood pressure cuff, CBG Meter ADL Screening (condition at time of admission) Patient's cognitive ability adequate to safely complete daily activities?: Yes Is the patient deaf or have difficulty hearing?: Yes Does the patient have difficulty seeing, even when wearing glasses/contacts?: No Does the patient have difficulty concentrating, remembering, or making decisions?: No Patient able to express need for assistance with ADLs?: Yes Does the patient have difficulty dressing  or bathing?: No Independently performs ADLs?: No Communication: Independent Dressing (OT): Needs assistance Is this a change from baseline?: Pre-admission baseline Grooming: Needs assistance Is this a change from baseline?: Pre-admission baseline Feeding: Independent Bathing: Needs assistance Is this a change from baseline?: Pre-admission baseline Toileting: Needs assistance Is this a change from baseline?: Pre-admission baseline In/Out Bed: Needs assistance Is this a change from baseline?: Pre-admission baseline Walks in Home: Needs assistance Is this a change from baseline?: Pre-admission baseline Does the patient have difficulty walking or climbing stairs?: Yes Weakness of Legs: None Weakness of Arms/Hands: None  Permission Sought/Granted                  Emotional Assessment         Alcohol / Substance Use: Not Applicable Psych Involvement: No (comment)  Admission diagnosis:  Acute CHF (congestive heart failure) ([place]) [I50.9] Congestive heart failure, unspecified HF chronicity, unspecified heart failure type ([place]) [I50.9] Patient Active Problem List   Diagnosis Date Noted   Acute CHF (congestive heart failure) ([place]) 12/04/2021   Malnutrition ([place]) 12/04/2021   NSTEMI (non-ST elevated myocardial infarction) ([place]) 11/05/2021   CKD stage 3 due to type 2 diabetes mellitus ([place]) 11/05/2021   Abnormal CXR    Pulmonary infiltrate in right lung on CXR    Hypoxia    Acute on chronic systolic CHF (congestive heart failure) ([place]) 10/24/2021   Acute on  chronic respiratory failure (HCC)    Normocytic anemia 09/18/2021   Other fatigue 96/75/9163   Chronic systolic congestive heart failure ([place]) 07/18/2021   Goals of care, counseling/discussion 04/16/2021   Mediastinal lymphadenopathy 04/16/2021   Heart failure with reduced ejection fraction ([place])    AAA (abdominal aortic aneurysm) without rupture 12/10/2019   Renal artery stenosis ([place]) 12/10/2019   PAD (peripheral  artery disease) ([place]) 12/10/2019   Thromboembolism ([place]) 08/30/2019   Atherosclerosis of native arteries of extremity with intermittent claudication ([place]) 08/23/2019   Swelling of limb 08/17/2019   Pain in limb 08/17/2019   NICM (nonischemic cardiomyopathy) ([place]) 07/01/2019   CHF (congestive heart failure) ([place]) 07/01/2019   Malignant neoplasm of right lung ([place]) 04/18/2016   CAD (coronary artery disease) 04/01/2016   Adenocarcinoma ([place]) 04/01/2016   Skin cyst 12/26/2015   GERD (gastroesophageal reflux disease) 06/16/2015   Abscess of back 06/06/2015   Vertigo 03/27/2015   Carpal tunnel syndrome 10/28/2014   Status post cholecystectomy 09/29/2014   Disease of digestive tract 09/29/2014   Cardiomyopathy ([place]) 09/26/2014   Type 2 diabetes mellitus without complications ([place])    Sleep apnea    Spinal stenosis, lumbar region, with neurogenic claudication 06/07/2014   Lumbar stenosis with neurogenic claudication 06/07/2014   Abnormal gait 08/20/2012   H/O total knee replacement 08/20/2012   Arthritis of knee, degenerative 08/20/2012   Pacemaker-St.Jude 08/03/2012   Cardiac conduction disorder 06/19/2012   Acid reflux 06/18/2012   Nodal rhythm disorder 06/18/2012   Triggering of digit 03/25/2012   Hyperlipidemia 12/23/2011   Essential hypertension 03/23/2011   Atrioventricular block, complete ([place]) 03/23/2011   Complete atrioventricular block ([place]) 03/23/2011   PCP:  Jerrol Banana., MD Pharmacy:   [place], [place] - [address] [place] [address] Phone: [phone] Fax: [phone]  [facility] [place], [place] - [address] [address] Phone: [phone] Fax: [phone]  [facility] [place] [place] [address] [place] [address] Phone: [phone] Fax: [place], [address] [place] [address] Phone: [phone] Fax: [phone]     Social Determinants of Health (SDOH) Interventions    Readmission Risk Interventions Readmission Risk Prevention Plan 11/07/2021 10/26/2021 09/02/2019  Transportation Screening Complete Complete Complete  PCP or Specialist Appt within 5-7 Days - - -  PCP or Specialist Appt within 3-5 Days - Complete Complete  Home Care Screening - - -  Medication Review (RN CM) - - -  [place] or [place] - Complete Complete  Social Work Consult for [place] Planning/Counseling - Complete Complete  Palliative Care Screening - Not Applicable Not Applicable  Medication Review Press photographer) Complete Complete Complete  PCP or Specialist appointment within 3-5 days of discharge Complete - -  [place] or Home Care Consult Complete - -  SW Recovery Care/Counseling Consult Complete - -  Palliative Care Screening Not Applicable - -  [place] Not Applicable - -  Some recent data might be hidden

## 2021-12-05 NOTE — Progress Notes (Signed)
[place] at [place] NAME: Howard [name]    MR#:  [id number]  DATE OF BIRTH:  07-03-1931  SUBJECTIVE:  came in with increasing shortness of breath. Found to be in congestive heart failure. Patient required BiPAP. Currently on nasal cannula oxygen. REVIEW OF SYSTEMS:   Review of Systems  Constitutional:  Negative for chills, fever and weight loss.  HENT:  Negative for ear discharge, ear pain and nosebleeds.   Eyes:  Negative for blurred vision, pain and discharge.  Respiratory:  Positive for shortness of breath. Negative for sputum production, wheezing and stridor.   Cardiovascular:  Negative for chest pain, palpitations, orthopnea and PND.  Gastrointestinal:  Negative for abdominal pain, diarrhea, nausea and vomiting.  Genitourinary:  Negative for frequency and urgency.  Musculoskeletal:  Negative for back pain and joint pain.  Neurological:  Positive for weakness. Negative for sensory change, speech change and focal weakness.  Psychiatric/Behavioral:  Negative for depression and hallucinations. The patient is not nervous/anxious.   Tolerating Diet: Tolerating PT:   DRUG ALLERGIES:   Allergies  Allergen Reactions   Sulfa Antibiotics Rash    VITALS:  Blood pressure 123/63, pulse 92, temperature 98.1 F (36.7 C), resp. rate 18, height 5\' 9"  (1.753 m), weight 81.8 kg, SpO2 95 %.  PHYSICAL EXAMINATION:   Physical Exam  GENERAL:  87 y.o.-year-old patient lying in the bed with no acute distress.  HEENT: Head atraumatic, normocephalic. Oropharynx and nasopharynx clear.  LUNGS:decreased breath sounds bilaterally, no wheezing, rales, rhonchi. No use of accessory muscles of respiration.  CARDIOVASCULAR: S1, S2 normal. No murmurs, rubs, or gallops.  ABDOMEN: Soft, nontender, nondistended. Bowel sounds present. No organomegaly or mass.  EXTREMITIES: No cyanosis, clubbing or edema b/l.    NEUROLOGIC: nonfocal PSYCHIATRIC:  patient is alert and  oriented x 3.  SKIN: No obvious rash, lesion, or ulcer.   LABORATORY PANEL:  CBC Recent Labs  Lab 12/05/21 0729  WBC 6.4  HGB 10.0*  HCT 30.3*  PLT 248    Chemistries  Recent Labs  Lab 12/04/21 0413 12/05/21 0729  NA 142 140  K 3.2* 3.0*  CL 111 110  CO2 22 21*  GLUCOSE 203* 143*  BUN 23 20  CREATININE 1.16 0.99  CALCIUM 9.2 8.7*  AST 23  --   ALT 14  --   ALKPHOS 58  --   BILITOT 0.7  --    Cardiac Enzymes No results for input(s): TROPONINI in the last 168 hours. RADIOLOGY:  CT CHEST WO CONTRAST  Result Date: 12/04/2021 CLINICAL DATA:  Concern for infection EXAM: CT CHEST WITHOUT CONTRAST TECHNIQUE: Multidetector CT imaging of the chest was performed following the standard protocol without IV contrast. COMPARISON:  Multiple priors, most recent dated October 24, 2021 FINDINGS: Cardiovascular: Mild cardiomegaly. Trace pericardial effusion. Three-vessel coronary artery calcifications left chest wall dual lead pacer with leads in the right atrium and right ventricle. Mitral annular calcifications. Atherosclerotic disease of the thoracic aorta. Mediastinum/Nodes: Esophagus and thyroid are unremarkable. Stable right upper paratracheal lymph node measuring 1.0 cm on series 2 image 152 no pathologically enlarged lymph nodes seen in the chest. Lungs/Pleura: Central airways are patent. Increased bilateral dependent ground-glass opacities. Right lower lobe and paramediastinal consolidations with associated bronchiectasis, are similar to prior exam, although evaluation is limited due to presence of new pleural effusions and ground-glass opacities. Small right and trace left pleural effusions. Stable calcified nodule the right middle lobe measuring 2.0 cm and stable  calcified nodule of the left lingula measuring 2.2 cm. Upper Abdomen: Cholecystectomy clips.  No acute abnormality. Musculoskeletal: No chest wall mass or suspicious bone lesions identified. IMPRESSION: 1. New small right and  trace left pleural effusions. Increased bilateral dependent ground-glass opacities with background right lower lobe and right paramediastinal consolidations, findings are favored to represent atelectasis and pulmonary edema superimposed on background post radiation changes. Recommend follow-up after resolution of acute symptoms to better assess post treatment changes. 2. Stable solid partially calcified nodules of the right middle lobe and lingula. 3. Three-vessel coronary artery calcifications. 4. Aortic Atherosclerosis (ICD10-I70.0). Electronically Signed   By: Yetta Glassman M.D.   On: 12/04/2021 09:22   DG Chest Portable 1 View  Result Date: 12/04/2021 CLINICAL DATA:  Respiratory distress and chest pain. History of lung cancer. EXAM: PORTABLE CHEST 1 VIEW COMPARISON:  Portable chest 11/05/2021 FINDINGS: The cardiac size is normal. There is patchy calcification throughout the thoracic aorta. Left chest dual lead pacing system and wire insertions are stable. The central vessels are normal in caliber. On the left there are scattered linear scar-like opacities in the lower lung zone but no evidence of focal pneumonia. On the right there is increased upper lobe opacity in the mid perihilar area, small increased right pleural effusion and additional increased opacity in the medial basal right lung, findings concerning for multilobar pneumonia. The left sulci are sharp. IMPRESSION: Increased opacities in the right lung concerning for multilobar pneumonia. Clinical correlation and radiographic follow-up recommended. Small right pleural effusion. Electronically Signed   By: Telford Nab M.D.   On: 12/04/2021 04:44   ASSESSMENT AND PLAN:  Howard [name] is a 87 y.o. male with medical history significant for  HFrEF (with last known LVEF of 20 - 25%), diabetes mellitus with complications of stage III chronic kidney disease, gastric reflux, hypertension, hyperlipidemia, non-small cell lung cancer s/p radiation,  chronic respiratory failure with emphysema, on 3 L of oxygen at home, complete heart block status post pacemaker insertion, CAD, who presents to the emergency department for evaluation of chest pain and shortness of breath.  Acute on chronic systolic heart failure Dilated cardiomyopathy EF 20--25% --Patient presents for evaluation of sudden onset shortness of breath and chest pain --Patient has had a 7 pound weight gain over the last couple of weeks --Imaging shows bilateral pleural effusions with pulmonary edema and BNP is elevated --Last known LVEF of 20 to 25% with global hypokinesis and LVH (11/22) --Will place patient on Lasix 40 mg IV tid (by [facility]) --Continue spironolactone, metoprolol and Entresto ---Maintain low-sodium diet ---Check daily weights    Acute on chronic respiratory failure Secondary to acute systolic CHF --At baseline patient usually wears 3 L of oxygen continuous for his known history of COPD as well as lung cancer but had pulse oximetry in the 80s with associated tachypnea and increased work of breathing.  --He is currently on a BiPAP to reduce work of breathing and improve oxygenation --Will wean off BiPAP as tolerated    Diabetes mellitus with complications of stage III chronic kidney disease --Maintain consistent carbohydrate diet --Glycemic control with sliding scale insulin    Dementia with depression Continue donepezil and sertraline   History of complete heart block Status post pacemaker insertion   Elevated troponin/history of coronary artery disease --Patient presents for evaluation of chest pain relieved with nitroglycerin --Has a known history of coronary artery disease and was recently admitted to the hospital for non-STEMI --Medical management was recommended at that  time ---Continue metoprolol, nitrates and statins    Moderate protein calorie malnutrition Continue Megace for appetite stimulant Encourage increase  oral protein calorie intake    Chronic diarrhea Infectious etiology related Continue scheduled Lomotil   Procedures: Family communication :wife on the phone Consults :[facility] (cards) CODE STATUS: DNR DVT Prophylaxis :Eliquis Level of care: Progressive Status is: Inpatient  Remains inpatient appropriate because: CHF        TOTAL TIME TAKING CARE OF THIS PATIENT: 30 minutes.  >50% time spent on counselling and coordination of care  Note: This dictation was prepared with Dragon dictation along with smaller phrase technology. Any transcriptional errors that result from this process are unintentional.  Fritzi Mandes M.D    [facility]   CC: Primary care physician; Jerrol Banana., MD Patient ID: Howard [name], male   DOB: [birth year], 87 y.o.   MRN: [id number]

## 2021-12-05 NOTE — Consult Note (Signed)
° °  Heart Failure Nurse Navigator Note  HFrEF 20 to 25% by echocardiogram performed on October 25, 2021.  So noted was mild LVH.  Grade 2 diastolic dysfunction normal right ventricular systolic function.  Mild to moderate aortic sclerosis.  He presented from home with complaints of chest pain, shortness of breath, bilateral lower extremity edema and productive cough.  Comorbidities:  Diabetes Chronic kidney disease stage III Gastric reflux Hypertension Hyperlipidemia Sleep apnea  Emphysema on chronic O2 Non-small cell lung cancer treated with radiation Coronary artery disease  Permanent pacemaker for complete heart block  Labs:  Sodium 140, potassium 3, chloride 110, CO2 21, BUN 20, creatinine 0.99, hemoglobin 10 platelet count 240.  BNP on admission was greater than 4500. Weight 81.8 kg Blood pressure 137/80 Intake-not documented Output 200 cc  Medications:  Eliquis 5 mg 2 times a day Aspirin 81 mg daily Furosemide 40 mg 3 times a day IV Imdur 60 mg daily Metoprolol succinate 25 mg at bedtime. Potassium chloride 20 mEq 2 times a day Entresto 49/51 mg 2 times a day Spironolactone 12-1/2 mg daily  Initial meeting with patient and wife on this admission.  Talked about how he takes care of himself at home, he had not been weighing himself daily but wife voiced that she had noted a weight gain between 2 different doctors appointments several days apart.  Discussed the importance of weighing daily and reporting weight gain.  She states that they had not been doing it because of his frequent diarrhea.  Also explained to her that weight loss is something that would be important to be reporting also.  Discussed fluid restriction, she did not feel that he drank over eight 8 ounce cup in a 24-hour period.  He does drink 2 cans of Ensure daily.  He does not use salt at the table.  Wife voices concern over his bouts of diarrhea been going on since the middle of November.  She was  given the living with heart failure teaching booklet, zone magnet and information on low-sodium.  They had no further questions and we will continue to follow as long as they are in the hospital.  He has an appointment in the outpatient heart failure clinic on 01/05/2022 at 11:30 AM.  He has a 2% rating of no-show 10 out of 464 appointments   Howard [name]

## 2021-12-05 NOTE — ED Notes (Signed)
Perineal care performed on pt after a large, loose bowel movement. Chuck pads, blankets,and sheets all changed. Pt tolerated well.

## 2021-12-06 DIAGNOSIS — N183 Chronic kidney disease, stage 3 unspecified: Secondary | ICD-10-CM

## 2021-12-06 DIAGNOSIS — J9621 Acute and chronic respiratory failure with hypoxia: Secondary | ICD-10-CM

## 2021-12-06 DIAGNOSIS — E1122 Type 2 diabetes mellitus with diabetic chronic kidney disease: Secondary | ICD-10-CM

## 2021-12-06 DIAGNOSIS — J9622 Acute and chronic respiratory failure with hypercapnia: Secondary | ICD-10-CM

## 2021-12-06 DIAGNOSIS — I420 Dilated cardiomyopathy: Secondary | ICD-10-CM

## 2021-12-06 DIAGNOSIS — K529 Noninfective gastroenteritis and colitis, unspecified: Secondary | ICD-10-CM

## 2021-12-06 LAB — GLUCOSE, CAPILLARY
Glucose-Capillary: 157 mg/dL — ABNORMAL HIGH (ref 70–99)
Glucose-Capillary: 170 mg/dL — ABNORMAL HIGH (ref 70–99)
Glucose-Capillary: 155 mg/dL — ABNORMAL HIGH (ref 70–99)
Glucose-Capillary: 137 mg/dL — ABNORMAL HIGH (ref 70–99)

## 2021-12-06 LAB — POTASSIUM: Potassium: 2.9 mmol/L — ABNORMAL LOW (ref 3.5–5.1)

## 2021-12-06 MED ORDER — AMOXICILLIN-POT CLAVULANATE 875-125 MG PO TABS
1.0000 | ORAL_TABLET | Freq: Two times a day (BID) | ORAL | Status: DC
  Administered 2021-12-06 – 2021-12-08 (×5): 1 via ORAL
  Filled 2021-12-06 (×5): qty 1

## 2021-12-06 MED ORDER — POTASSIUM CHLORIDE CRYS ER 20 MEQ PO TBCR
40.0000 meq | EXTENDED_RELEASE_TABLET | Freq: Once | ORAL | Status: AC
  Administered 2021-12-06: 40 meq via ORAL
  Filled 2021-12-06: qty 2

## 2021-12-06 MED ORDER — DIPHENOXYLATE-ATROPINE 2.5-0.025 MG PO TABS
1.0000 | ORAL_TABLET | Freq: Three times a day (TID) | ORAL | Status: DC
  Administered 2021-12-06 – 2021-12-08 (×7): 1 via ORAL
  Filled 2021-12-06 (×7): qty 1

## 2021-12-06 NOTE — Consult Note (Signed)
PHARMACY CONSULT NOTE - FOLLOW UP  Pharmacy Consult for Electrolyte Monitoring and Replacement   Recent Labs: Potassium (mmol/L)  Date Value  12/06/2021 2.9 (L)  11/10/2014 3.9   Magnesium (mg/dL)  Date Value  11/07/2021 1.9   Calcium (mg/dL)  Date Value  12/05/2021 8.7 (L)   Calcium, Total (mg/dL)  Date Value  11/10/2014 8.5   Albumin (g/dL)  Date Value  12/04/2021 3.7  11/13/2020 4.2  09/06/2014 3.4   Phosphorus (mg/dL)  Date Value  11/07/2021 3.5   Sodium (mmol/L)  Date Value  12/05/2021 140  11/13/2020 139  11/10/2014 137     Assessment: Patient with PMH relevant for HFrEF (ef 25%), and splenic infarct. Admitted with HF exacerbation and placed on IV lasix. Noted spironolactone and KCL supplement already on med list.  Potassium level was 3.0 on 12/14 and 2.9 today 12/15. Pharmacy was consulted for electrolyte replacement.  Goal of Therapy:  Electrolytes WNL  Plan:  Kcl 67meq po x 1 dose today (additional to schedule replacement Potassium level with AM labs  Lorenda Grecco Rodriguez-Guzman PharmD, BCPS 12/06/2021 1:19 PM

## 2021-12-06 NOTE — Progress Notes (Addendum)
Progress Note  Patient Name: Howard [name] Date of Encounter: 12/06/2021  [facility] Cardiologist: [name], MD   Subjective   Long discussion with son, patient and wife Coughing up slight red sputum, he and wife report some wheezing past 3 days  Very high fluid intake at home, wife pushing water among other beverages on Lasix 20 oral dosing at home with no dose increase for fluid retention or weight gain  Family in town last weekend, restaurant food, likely some dietary indiscretion and fluid indiscretion 3 beverages on his bedside table this morning after breakfast, had coffee, water among other beverages  Reports that he likes to drink a lot of water at home, has a dry mouth  We did discuss episode of chest pain he had on admission at home, took a nitro before he came in Has not had further episodes of angina since that time Chest tightness appear to be in the setting of respiratory distress  Inpatient Medications    Scheduled Meds:  amoxicillin-clavulanate  1 tablet Oral Q12H   apixaban  5 mg Oral BID   ascorbic acid  500 mg Oral Daily   aspirin EC  81 mg Oral Daily   budesonide (PULMICORT) nebulizer solution  0.5 mg Nebulization BID   cholecalciferol  1,000 Units Oral Daily   diphenoxylate-atropine  1 tablet Oral TID   donepezil  5 mg Oral Daily   furosemide  40 mg Intravenous TID   insulin aspart  0-15 Units Subcutaneous TID WC   isosorbide mononitrate  60 mg Oral Daily   loratadine  10 mg Oral Daily   megestrol  40 mg Oral BID   metoprolol succinate  25 mg Oral QHS   multivitamin with minerals  1 tablet Oral Daily   potassium chloride  20 mEq Oral BID   rifaximin  550 mg Oral TID   sacubitril-valsartan  1 tablet Oral BID   sertraline  50 mg Oral QHS   simvastatin  40 mg Oral QHS   sodium chloride flush  3 mL Intravenous Q12H   spironolactone  12.5 mg Oral Daily   Continuous Infusions:  sodium chloride     PRN Meds: sodium chloride,  acetaminophen, albuterol, nitroGLYCERIN, ondansetron **OR** ondansetron (ZOFRAN) IV, sodium chloride flush   Vital Signs    Vitals:   12/06/21 0500 12/06/21 0727 12/06/21 0803 12/06/21 1046  BP:  (!) 146/77  (!) 124/94  Pulse:  93  95  Resp:      Temp:  (!) 97.5 F (36.4 C)  97.9 F (36.6 C)  TempSrc:      SpO2:  97% 96% 97%  Weight: 80.3 kg     Height:        Intake/Output Summary (Last 24 hours) at 12/06/2021 1415 Last data filed at 12/05/2021 1848 Gross per 24 hour  Intake 240 ml  Output 300 ml  Net -60 ml   Last 3 Weights 12/06/2021 12/05/2021 12/03/2021  Weight (lbs) 177 lb 0.5 oz 180 lb 4.8 oz 187 lb  Weight (kg) 80.3 kg 81.784 kg 84.823 kg      Telemetry    - Personally Reviewed  ECG    - Personally Reviewed  Physical Exam   GEN: No acute distress.   Neck: No JVD Cardiac: RRR, no murmurs, rubs, or gallops.  Respiratory: Clear to auscultation bilaterally. GI: Soft, nontender, non-distended  MS: No edema; No deformity. Neuro:  Nonfocal  Psych: Normal affect   Labs    High  Sensitivity Troponin:   Recent Labs  Lab 12/04/21 0413 12/04/21 0806 12/04/21 1036 12/04/21 1246  TROPONINIHS 47* 238* 314* 694*     Chemistry Recent Labs  Lab 12/04/21 0413 12/05/21 0729 12/06/21 1218  NA 142 140  --   K 3.2* 3.0* 2.9*  CL 111 110  --   CO2 22 21*  --   GLUCOSE 203* 143*  --   BUN 23 20  --   CREATININE 1.16 0.99  --   CALCIUM 9.2 8.7*  --   PROT 7.7  --   --   ALBUMIN 3.7  --   --   AST 23  --   --   ALT 14  --   --   ALKPHOS 58  --   --   BILITOT 0.7  --   --   GFRNONAA >60 >60  --   ANIONGAP 9 9  --     Lipids No results for input(s): CHOL, TRIG, HDL, LABVLDL, LDLCALC, CHOLHDL in the last 168 hours.  Hematology Recent Labs  Lab 12/04/21 0413 12/05/21 0729  WBC 9.5 6.4  RBC 4.05* 3.55*  HGB 11.2* 10.0*  HCT 34.9* 30.3*  MCV 86.2 85.4  MCH 27.7 28.2  MCHC 32.1 33.0  RDW 16.7* 16.5*  PLT 325 248   Thyroid No results for  input(s): TSH, FREET4 in the last 168 hours.  BNP Recent Labs  Lab 12/04/21 0416  BNP >4,500.0*    DDimer No results for input(s): DDIMER in the last 168 hours.   Radiology    No results found.  Cardiac Studies     Patient Profile     MEDFORD STAHELI is a 87 y.o. male with a history of nonobs CAD, HFrEF (20-25%), NICM, CHB s/p PPM, lung cancer s/p radiation, chronic hypoxic resp failure on supplemental O2 @ 3lpm, splenic infarct on chronic OAC, AAA s/p endovascular grafting, PAD s/p multiple interventions, DMII, HTN, HL, bell's palsy, prostate cancer, vertigo, diarrhea, and OSA on CPAP, who is being seen today for the evaluation of chest pain and dyspnea  Assessment & Plan   Demand ischemia  in the setting of respiratory distress, known coronary artery disease with chronic stable angina and ischemic cardiomyopathy -Last catheterization April 2022 1 recent hospitalization for non-STEMI Additional hospitalization for demand ischemia, diarrhea weakness -High fluid intake at home leading to acute on chronic diastolic and systolic CHF, respiratory distress Peak troponin 694 -Repeat troponin tomorrow morning to trend -Discussed with patient, patient's son, patient's wife, they are not particularly interested in cardiac catheterization at this time given his lack of anginal symptoms and likely cause of his enzyme elevation to be from respiratory distress -On Eliquis and aspirin, simvastatin, metoprolol  Cardiomyopathy Severely reduced ejection fraction 20 to 25% On metoprolol succinate, spironolactone, Entresto, Imdur Prior catheterization April 2022, nonischemic cardiomyopathy No further ischemic work-up needed at this time -High fluid intake at home, discussed need for restriction -Would continue IV Lasix for now Was started on 3 times daily IV Lasix dosing yesterday, will back down to twice daily = Low threshold to adding milrinone if poor urine output  History of bronchitis,  pneumonia, COPD Has wheezing on exam, slight red sputum High risk of recurrent COPD exacerbation, bronchitis.  Recent hospital admission for similar  Discussed with hospitalist service  Chronic diarrhea Holding metformin, Zetia, any other potential agents Has follow-up with GI  Long discussion with son, patient, wife at the bedside concerning multiple issues as detailed above  Total encounter time more than 35 minutes  Greater than 50% was spent in counseling and coordination of care with the patient   For questions or updates, please contact [place] Please consult [email or web address] for contact info under        Signed, [name], MD  12/06/2021, 2:15 PM

## 2021-12-06 NOTE — Evaluation (Signed)
Physical Therapy Evaluation Patient Details Name: Howard [name] MRN: [id number] DOB: 05/21/1931 Today's Date: 12/06/2021  History of Present Illness   Pt is an 87 y.o. male with medical history significant for HFrEF (with last known LVEF of 20 - 25%), diabetes mellitus with complications of stage III chronic kidney disease, gastric reflux, hypertension, hyperlipidemia, non-small cell lung cancer s/p radiation, dementia with depression, chronic respiratory failure with emphysema, on 3 L of oxygen at home, complete heart block status post pacemaker insertion, CAD, who presents to the emergency department for evaluation of chest pain and shortness of breath. MD assessment includes: acute on chronic systolic HF, elevated troponin, and acute on chronic respiratory failure.   Clinical Impression  Pt was pleasant and motivated to participate during the session and put forth good effort throughout. Pt was able to complete bed mobility with mod independence using bed rails. Pt required min assist with STS due to weakness. Pt was able to ambulate ~150 ft with min guard for safety on 3L/O2 and no LOB. Pt demonstrated a consistent and normal cadence. Pt was able to complete 6 stairs with min guard for safety. While ascending the stairs, pt demonstrated a reciprocal pattern with L railing and when descending, pt utilized a step to pattern and used both hands on R railing. Pt reported feeling fatigued following stairs. SpO2 and HR remained WNL throughout the session. Pt will benefit from HHPT upon discharge to safely address deficits listed in patient problem list for decreased caregiver assistance and eventual return to PLOF.   Recommendations for follow up therapy are one component of a multi-disciplinary discharge planning process, led by the attending physician.  Recommendations may be updated based on patient status, additional functional criteria and insurance authorization.  Follow Up Recommendations Home  health PT    Assistance Recommended at Discharge Intermittent Supervision/Assistance  Functional Status Assessment Patient has had a recent decline in their functional status and demonstrates the ability to make significant improvements in function in a reasonable and predictable amount of time.  Equipment Recommendations  None recommended by PT    Recommendations for Other Services       Precautions / Restrictions Precautions Precautions: Fall Restrictions Weight Bearing Restrictions: No      Mobility  Bed Mobility Overal bed mobility: Modified Independent             General bed mobility comments: Increased time and effort, use of bedrails, did not need assistance    Transfers Overall transfer level: Needs assistance Equipment used: Rolling walker (2 wheels) Transfers: Sit to/from Stand Sit to Stand: Min assist           General transfer comment: Increased time and effort, min assist due to weakness    Ambulation/Gait Ambulation/Gait assistance: Min guard Gait Distance (Feet): 150 Feet Assistive device: Rolling walker (2 wheels) Gait Pattern/deviations: Step-through pattern;Decreased step length - right;Decreased step length - left;Decreased stride length Gait velocity: normal     General Gait Details: Demonstrated consistent cadence, reported feeling fatigued toward the end, SpO2 and HR remained WNL  Stairs Stairs: Yes Stairs assistance: Min guard Stair Management: One rail Right;One rail Left;Alternating pattern;Step to pattern;Forwards Number of Stairs: 6 General stair comments: Ascended stairs with L railing and alternating pattern, descended with R railing and both hands and a step to pattern, CGA for safety  Wheelchair Mobility    Modified Rankin (Stroke Patients Only)       Balance Overall balance assessment: Needs assistance Sitting-balance support: Feet supported;Bilateral upper  extremity supported Sitting balance-Leahy Scale: Good      Standing balance support: Bilateral upper extremity supported;During functional activity Standing balance-Leahy Scale: Fair                               Pertinent Vitals/Pain Pain Assessment: 0-10 Pain Score: 0-No pain    Home Living Family/patient expects to be discharged to:: Private residence Living Arrangements: Spouse/significant other (wife) Available Help at Discharge: Family;Available PRN/intermittently;Home health;Personal care attendant (Wife, home health aid, HHPT, HHOT) Type of Home: House Home Access: Stairs to enter Entrance Stairs-Rails: Right;Left;Can reach both Entrance Stairs-Number of Steps: 3   Home Layout: Laundry or work area in basement;One level Home Equipment: Rollator (4 wheels);BSC/3in1;Grab bars - toilet;Grab bars - tub/shower;Shower seat;Rolling Environmental consultant (2 wheels) Additional Comments: Grab bar/railing next to bed to aid with bed mobility    Prior Function Prior Level of Function : Independent/Modified Independent;Needs assist;History of Falls (last six months)       Physical Assist : ADLs (physical)   ADLs (physical): Bathing Mobility Comments: Pt reports being mod independent with mobility, uses rollator, history of 2 falls in the past 6 months, on 3L/O2 at home PRN ADLs Comments: Pt reports home health aid comes to help him with ADLs, reports getting HHPT and HHOT 1-2x per week     Hand Dominance        Extremity/Trunk Assessment   Upper Extremity Assessment Upper Extremity Assessment: Generalized weakness    Lower Extremity Assessment Lower Extremity Assessment: Generalized weakness       Communication   Communication: HOH  Cognition Arousal/Alertness: Awake/alert Behavior During Therapy: WFL for tasks assessed/performed Overall Cognitive Status: Within Functional Limits for tasks assessed                                          General Comments      Exercises Total Joint Exercises Long Arc  Quad: AROM;Both;10 reps;Seated Marching in Standing: AROM;Both;10 reps;Standing Other Exercises Other Exercises: Pt education on benefits of sitting in recliner   Assessment/Plan    PT Assessment Patient needs continued PT services  PT Problem List Decreased strength;Decreased activity tolerance;Decreased balance;Decreased mobility;Decreased knowledge of use of DME       PT Treatment Interventions DME instruction;Gait training;Stair training;Functional mobility training;Therapeutic activities;Therapeutic exercise;Balance training;Patient/family education    PT Goals (Current goals can be found in the Care Plan section)  Acute Rehab PT Goals Patient Stated Goal: to get back to walking normally PT Goal Formulation: With patient Time For Goal Achievement: 12/19/21 Potential to Achieve Goals: Good    Frequency Min 2X/week   Barriers to discharge        Co-evaluation               AM-PAC PT "6 Clicks" Mobility  Outcome Measure Help needed turning from your back to your side while in a flat bed without using bedrails?: A Little Help needed moving from lying on your back to sitting on the side of a flat bed without using bedrails?: A Little Help needed moving to and from a bed to a chair (including a wheelchair)?: A Little Help needed standing up from a chair using your arms (e.g., wheelchair or bedside chair)?: A Little Help needed to walk in hospital room?: A Little Help needed climbing 3-5 steps with a railing? : A  Little 6 Click Score: 18    End of Session Equipment Utilized During Treatment: Gait belt;Oxygen Activity Tolerance: Patient limited by fatigue Patient left: in chair;with call bell/phone within reach;with chair alarm set;with family/visitor present (Wife in room) Nurse Communication: Mobility status PT Visit Diagnosis: Unsteadiness on feet (R26.81);History of falling (Z91.81);Muscle weakness (generalized) (M62.81);Difficulty in walking, not elsewhere  classified (R26.2)    Time: [id number] (Doctor came in; PT came back from 9:57-10:17) PT Time Calculation (min) (ACUTE ONLY): 35 min   Charges:   PT Evaluation $PT Eval Low Complexity: 1 Low PT Treatments $Gait Training: 8-22 mins      Sheldon Silvan SPT 12/06/21, 1:08 PM

## 2021-12-06 NOTE — Progress Notes (Signed)
[place] [facility] [place] NAME: Howard [name]    MR#:  [id number]  DATE OF BIRTH:  1931/08/08  SUBJECTIVE:  came in with increasing shortness of breath. Found to be in congestive heart failure. Currently on nasal cannula oxygen. Feels a bit better. Wife and son in the room REVIEW OF SYSTEMS:   Review of Systems  Constitutional:  Negative for chills, fever and weight loss.  HENT:  Negative for ear discharge, ear pain and nosebleeds.   Eyes:  Negative for blurred vision, pain and discharge.  Respiratory:  Positive for shortness of breath. Negative for sputum production, wheezing and stridor.   Cardiovascular:  Negative for chest pain, palpitations, orthopnea and PND.  Gastrointestinal:  Negative for abdominal pain, diarrhea, nausea and vomiting.  Genitourinary:  Negative for frequency and urgency.  Musculoskeletal:  Negative for back pain and joint pain.  Neurological:  Positive for weakness. Negative for sensory change, speech change and focal weakness.  Psychiatric/Behavioral:  Negative for depression and hallucinations. The patient is not nervous/anxious.   Tolerating Diet:yes Tolerating PT: HHPT  DRUG ALLERGIES:   Allergies  Allergen Reactions   Sulfa Antibiotics Rash    VITALS:  Blood pressure 130/63, pulse 88, temperature (!) 97.4 F (36.3 C), resp. rate 18, height 5\' 9"  (1.753 m), weight 80.3 kg, SpO2 91 %.  PHYSICAL EXAMINATION:   Physical Exam  GENERAL:  87 y.o.-year-old patient lying in the bed with no acute distress.  HEENT: Head atraumatic, normocephalic. Oropharynx and nasopharynx clear.  LUNGS:decreased breath sounds bilaterally, no wheezing, rales, rhonchi. No use of accessory muscles of respiration.  CARDIOVASCULAR: S1, S2 normal. No murmurs, rubs, or gallops.  ABDOMEN: Soft, nontender, nondistended. Bowel sounds present. No organomegaly or mass.  EXTREMITIES: No cyanosis, clubbing or edema b/l.    NEUROLOGIC:  nonfocal PSYCHIATRIC:  patient is alert and oriented x 3.  SKIN: No obvious rash, lesion, or ulcer.   LABORATORY PANEL:  CBC Recent Labs  Lab 12/05/21 0729  WBC 6.4  HGB 10.0*  HCT 30.3*  PLT 248     Chemistries  Recent Labs  Lab 12/04/21 0413 12/05/21 0729 12/06/21 1218  NA 142 140  --   K 3.2* 3.0* 2.9*  CL 111 110  --   CO2 22 21*  --   GLUCOSE 203* 143*  --   BUN 23 20  --   CREATININE 1.16 0.99  --   CALCIUM 9.2 8.7*  --   AST 23  --   --   ALT 14  --   --   ALKPHOS 58  --   --   BILITOT 0.7  --   --     Cardiac Enzymes No results for input(s): TROPONINI in the last 168 hours. RADIOLOGY:  No results found. ASSESSMENT AND PLAN:  IVAL PACER is a 87 y.o. male with medical history significant for  HFrEF (with last known LVEF of 20 - 25%), diabetes mellitus with complications of stage III chronic kidney disease, gastric reflux, hypertension, hyperlipidemia, non-small cell lung cancer s/p radiation, chronic respiratory failure with emphysema, on 3 L of oxygen at home, complete heart block status post pacemaker insertion, CAD, who presents to the emergency department for evaluation of chest pain and shortness of breath.  Acute on chronic systolic heart failure Dilated cardiomyopathy EF 20--25% --Patient presents for evaluation of sudden onset shortness of breath and chest pain --Patient has had a 7 pound weight gain over the last  couple of weeks --Imaging shows bilateral pleural effusions with pulmonary edema and BNP is elevated --Last known LVEF of 20 to 25% with global hypokinesis and LVH (11/22) --Will place patient on Lasix 40 mg IV tid (by [facility]) --Continue spironolactone, metoprolol and Entresto ---Maintain low-sodium diet ---Check daily weights --12/15-- feeling overall better. Diuresis well. Continue current meds per [facility] cardiology    Acute on chronic respiratory failure Secondary to acute systolic CHF --At baseline patient usually wears 3 L of  oxygen continuous for his known history of COPD as well as lung cancer but had pulse oximetry in the 80s with associated tachypnea and increased work of breathing.  --pt is off BIPAP    Diabetes mellitus with complications of stage III chronic kidney disease --Maintain consistent carbohydrate diet --Glycemic control with sliding scale insulin    Dementia with depression --Continue donepezil and sertraline   History of complete heart block --Status post pacemaker insertion   Elevated troponin/history of coronary artery disease --Patient presents for evaluation of chest pain relieved with nitroglycerin --Has a known history of coronary artery disease and was recently admitted to the hospital for non-STEMI --Medical management was recommended at that time ---Continue metoprolol, nitrates and statins    Moderate protein calorie malnutrition Continue Megace for appetite stimulant Encourage increase  oral protein calorie intake   Chronic diarrhea Continue scheduled Lomotil and Rifaximin (started by PCP)   Procedures: Family communication :wife and son at bedside Consults :[place] (cards) CODE STATUS: DNR DVT Prophylaxis :Eliquis Level of care: Progressive Status is: Inpatient  Remains inpatient appropriate because: CHF        TOTAL TIME TAKING CARE OF THIS PATIENT: 30 minutes.  >50% time spent on counselling and coordination of care  Note: This dictation was prepared with Dragon dictation along with smaller phrase technology. Any transcriptional errors that result from this process are unintentional.  Fritzi Mandes M.D    [facility]   CC: Primary care physician; Jerrol Banana., MD Patient ID: Howard [name], male   DOB: 11/24/1931, 87 y.o.   MRN: [id number]

## 2021-12-07 DIAGNOSIS — I25118 Atherosclerotic heart disease of native coronary artery with other forms of angina pectoris: Secondary | ICD-10-CM

## 2021-12-07 LAB — BASIC METABOLIC PANEL
Anion gap: 7 (ref 5–15)
BUN: 22 mg/dL (ref 8–23)
CO2: 27 mmol/L (ref 22–32)
Calcium: 8.9 mg/dL (ref 8.9–10.3)
Chloride: 107 mmol/L (ref 98–111)
Creatinine, Ser: 0.97 mg/dL (ref 0.61–1.24)
GFR, Estimated: >60 mL/min (ref >60)
Glucose, Bld: 151 mg/dL — ABNORMAL HIGH (ref 70–99)
Potassium: 3.5 mmol/L (ref 3.5–5.1)
Sodium: 141 mmol/L (ref 135–145)

## 2021-12-07 LAB — CBC
HCT: 28.9 % — ABNORMAL LOW (ref 39.0–52.0)
Hemoglobin: 9.7 g/dL — ABNORMAL LOW (ref 13.0–17.0)
MCH: 28.5 pg (ref 26.0–34.0)
MCHC: 33.6 g/dL (ref 30.0–36.0)
MCV: 85 fL (ref 80.0–100.0)
Platelets: 234 10*3/uL (ref 150–400)
RBC: 3.4 MIL/uL — ABNORMAL LOW (ref 4.22–5.81)
RDW: 15.9 % — ABNORMAL HIGH (ref 11.5–15.5)
WBC: 5.2 10*3/uL (ref 4.0–10.5)
nRBC: 0 % (ref 0.0–0.2)

## 2021-12-07 LAB — GLUCOSE, CAPILLARY
Glucose-Capillary: 166 mg/dL — ABNORMAL HIGH (ref 70–99)
Glucose-Capillary: 178 mg/dL — ABNORMAL HIGH (ref 70–99)
Glucose-Capillary: 236 mg/dL — ABNORMAL HIGH (ref 70–99)
Glucose-Capillary: 128 mg/dL — ABNORMAL HIGH (ref 70–99)

## 2021-12-07 LAB — POTASSIUM: Potassium: 3.4 mmol/L — ABNORMAL LOW (ref 3.5–5.1)

## 2021-12-07 LAB — TROPONIN I (HIGH SENSITIVITY): Troponin I (High Sensitivity): 1957 ng/L (ref <18)

## 2021-12-07 MED ORDER — POTASSIUM CHLORIDE 20 MEQ PO PACK
20.0000 meq | PACK | ORAL | Status: AC
  Administered 2021-12-07: 20 meq via ORAL
  Filled 2021-12-07: qty 1

## 2021-12-07 MED ORDER — POTASSIUM CHLORIDE 10 MEQ/100ML IV SOLN
10.0000 meq | INTRAVENOUS | Status: DC
  Administered 2021-12-07: 10 meq via INTRAVENOUS
  Filled 2021-12-07 (×2): qty 100

## 2021-12-07 NOTE — Progress Notes (Signed)
Progress Note  Patient Name: Howard [name] Date of Encounter: 12/07/2021  Primary Cardiologist: [name]  Subjective   No chest pain. Dyspnea improving. Some wheezing this morning. Had an episode of coughing this morning that was initially productive of clear sputum, followed by "red." No further episodes. Reports vigorous UOP.  Documented + 720 mL for the past 24 hours, net + 500 mL for the admission. Weight 80.3 trending to 78.6 kg this morning. Potassium improved from 2.9 to 3.4 this morning.   Inpatient Medications    Scheduled Meds:  amoxicillin-clavulanate  1 tablet Oral Q12H   apixaban  5 mg Oral BID   ascorbic acid  500 mg Oral Daily   aspirin EC  81 mg Oral Daily   budesonide (PULMICORT) nebulizer solution  0.5 mg Nebulization BID   cholecalciferol  1,000 Units Oral Daily   diphenoxylate-atropine  1 tablet Oral TID   donepezil  5 mg Oral Daily   furosemide  40 mg Intravenous TID   insulin aspart  0-15 Units Subcutaneous TID WC   isosorbide mononitrate  60 mg Oral Daily   loratadine  10 mg Oral Daily   megestrol  40 mg Oral BID   metoprolol succinate  25 mg Oral QHS   multivitamin with minerals  1 tablet Oral Daily   potassium chloride  20 mEq Oral BID   rifaximin  550 mg Oral TID   sacubitril-valsartan  1 tablet Oral BID   sertraline  50 mg Oral QHS   simvastatin  40 mg Oral QHS   sodium chloride flush  3 mL Intravenous Q12H   spironolactone  12.5 mg Oral Daily   Continuous Infusions:  sodium chloride     potassium chloride     PRN Meds: sodium chloride, acetaminophen, albuterol, nitroGLYCERIN, ondansetron **OR** ondansetron (ZOFRAN) IV, sodium chloride flush   Vital Signs    Vitals:   12/07/21 0446 12/07/21 0451 12/07/21 0739 12/07/21 0801  BP:  (!) 146/80 (!) 117/94   Pulse:  (!) 103 94   Resp:  18 18   Temp: 98.8 F (37.1 C) 97.9 F (36.6 C) 98 F (36.7 C)   TempSrc: Oral     SpO2:  100% 97% 97%  Weight: 78.6 kg     Height:         Intake/Output Summary (Last 24 hours) at 12/07/2021 0845 Last data filed at 12/07/2021 0815 Gross per 24 hour  Intake 600 ml  Output --  Net 600 ml   Filed Weights   12/05/21 0300 12/06/21 0500 12/07/21 0446  Weight: 81.8 kg 80.3 kg 78.6 kg    Telemetry    Paced - Personally Reviewed  ECG    No new tracing - Personally Reviewed  Physical Exam   GEN: No acute distress. Elderly appearing.    Neck: No JVD. Cardiac: RRR, I/VI systolic murmur RUSB, no rubs, or gallops.  Respiratory: Diminished breaths sounds bilaterally with mild expiratory wheezing.  GI: Soft, nontender, non-distended.   MS: No edema; No deformity. Neuro:  Alert and oriented x 3; Nonfocal.  Psych: Normal affect.  Labs    Chemistry Recent Labs  Lab 12/04/21 0413 12/05/21 0729 12/06/21 1218 12/07/21 0529  NA 142 140  --   --   K 3.2* 3.0* 2.9* 3.4*  CL 111 110  --   --   CO2 22 21*  --   --   GLUCOSE 203* 143*  --   --   BUN 23 20  --   --  CREATININE 1.16 0.99  --   --   CALCIUM 9.2 8.7*  --   --   PROT 7.7  --   --   --   ALBUMIN 3.7  --   --   --   AST 23  --   --   --   ALT 14  --   --   --   ALKPHOS 58  --   --   --   BILITOT 0.7  --   --   --   GFRNONAA >60 >60  --   --   ANIONGAP 9 9  --   --      Hematology Recent Labs  Lab 12/04/21 0413 12/05/21 0729  WBC 9.5 6.4  RBC 4.05* 3.55*  HGB 11.2* 10.0*  HCT 34.9* 30.3*  MCV 86.2 85.4  MCH 27.7 28.2  MCHC 32.1 33.0  RDW 16.7* 16.5*  PLT 325 248    Cardiac EnzymesNo results for input(s): TROPONINI in the last 168 hours. No results for input(s): TROPIPOC in the last 168 hours.   BNP Recent Labs  Lab 12/04/21 0416  BNP >4,500.0*     DDimer No results for input(s): DDIMER in the last 168 hours.   Radiology    No results found.  Cardiac Studies   2D echo 10/2021: 1. Left ventricular ejection fraction, by estimation, is 20 to 25%. The  left ventricle has severely decreased function. The left ventricle   demonstrates global hypokinesis. There is mild left ventricular  hypertrophy. Left ventricular diastolic parameters   are consistent with Grade II diastolic dysfunction (pseudonormalization).   2. Right ventricular systolic function is normal. The right ventricular  size is normal. Tricuspid regurgitation signal is inadequate for assessing  PA pressure.   3. Left atrial size was moderately dilated.   4. The mitral valve is normal in structure. Mild to moderate mitral valve  regurgitation. No evidence of mitral stenosis.   5. The aortic valve was not well visualized. Aortic valve regurgitation  is not visualized. Mild to moderate aortic valve sclerosis/calcification  is present, without any evidence of aortic stenosis.   6. There is borderline dilatation of the aortic root, measuring 37 mm.   7. The inferior vena cava is normal in size with greater than 50%  respiratory variability, suggesting right atrial pressure of 3 mmHg. __________  2D echo 07/2021: 1. Left ventricular ejection fraction, by estimation, is 20 to 25%. The  left ventricle has severely decreased function. The left ventricle  demonstrates global hypokinesis. There is moderate left ventricular  hypertrophy. Left ventricular diastolic  parameters are indeterminate.   2. Right ventricular systolic function is normal. The right ventricular  size is normal. Tricuspid regurgitation signal is inadequate for assessing  PA pressure.   3. Left atrial size was mildly dilated.   4. The mitral valve is degenerative. Mild mitral valve regurgitation.   5. The aortic valve is tricuspid. There is moderate calcification of the  aortic valve. There is moderate thickening of the aortic valve. Aortic  valve regurgitation is not visualized. Mild to moderate aortic valve  sclerosis/calcification is present,  without any evidence of aortic stenosis. __________  [facility] 03/2021: Prox RCA lesion is 30% stenosed. Dist RCA lesion is 50%  stenosed. Prox LAD to Mid LAD lesion is 20% stenosed. Ost Cx to Prox Cx lesion is 30% stenosed. 1st Mrg lesion is 40% stenosed. 2nd Diag lesion is 60% stenosed.   1.  Mild to moderate three-vessel coronary  artery disease with severe calcifications.  No obstructive lesions. 2.  Left ventricular angiography was not performed.  EF was severely reduced by echo. 3.  Right heart catheterization showed minimally elevated filling pressures, mild pulmonary hypertension and normal cardiac output.   Recommendations: The patient has nonischemic cardiomyopathy. Continue aggressive medical therapy. Volume status appears good.  Patient Profile     87 y.o. male with history of nonobstructive CAD by LHC in 03/2021, HFrEF secondary to NICM, complete heart block status post PPM, lung cancer s/p radiation, chronic hypoxic respiratory failure on supplemental oxygen at 3 L at baseline, splenic infarct on chronic OAC, AAA s/p engovascular grafting, PAD s/p multiple interventions, DM2, HTN, HLD, Bell's palsy, prostate cancer, vertigo, and OSA on CPAP who has had several recent admissions for NSTEMI medically managed and diarrhea with demand ischemia, who we are seeing for acute on chronic HFrEF and demand ischemia.   Assessment & Plan    1. Nonobstructive CAD with demand ischemia: -Mildly elevated HS-Tn, trended to 694 on 12/13, cycle to evaluate if value has peaked -LHC in 03/2021 with nonobstructive disease as above -Primary cardiologist has discussed the case with the patient, his wife, and his son and they have not been particularly interested in [facility] at this time given lack of anginal symptoms with elevated troponin felt to be demand ischemia secondary to known CAD in the context of respiratory distress and volume overload  -Aggressive risk factor modification -ASA -Metoprolol -Simvastatin  -Imdur -Has previously not been felt to be a candidate for cardiac rehab  2. HFrEF secondary to NICM: -Ins/outs may  be inaccurate  -Reports vigorous UOP -Weight trending down -Check BMP this morning to trend renal function -Remains on IV Lasix 40 mg tid -Check BMP -High volume of fluid intake at home -Toprol, Entresto, spironolactone  -Relative hypotension has precluded escalation of GDMT  3. Complete heart block: -Device appears to be functioning normally  4. History of splenic infarct: -PTA Eliquis -Check CBC   5. Hypokalemia: -Improving -Undergoing repletion        For questions or updates, please contact [place] Please consult [email or web address] for contact info under Cardiology/STEMI.    Signed, Christell Faith, PA-C [place] Pager: [phone] 12/07/2021, 8:45 AM

## 2021-12-07 NOTE — Progress Notes (Addendum)
[facility] NAME: Howard [name]    MR#:  [id number]  DATE OF BIRTH:  1931/05/24  SUBJECTIVE:  came in with increasing shortness of breath. Found to be in congestive heart failure. Currently on nasal cannula oxygen. Feels much better today Wife in the room REVIEW OF SYSTEMS:   Review of Systems  Constitutional:  Negative for chills, fever and weight loss.  HENT:  Negative for ear discharge, ear pain and nosebleeds.   Eyes:  Negative for blurred vision, pain and discharge.  Respiratory:  Positive for shortness of breath. Negative for sputum production, wheezing and stridor.   Cardiovascular:  Negative for chest pain, palpitations, orthopnea and PND.  Gastrointestinal:  Negative for abdominal pain, diarrhea, nausea and vomiting.  Genitourinary:  Negative for frequency and urgency.  Musculoskeletal:  Negative for back pain and joint pain.  Neurological:  Positive for weakness. Negative for sensory change, speech change and focal weakness.  Psychiatric/Behavioral:  Negative for depression and hallucinations. The patient is not nervous/anxious.   Tolerating Diet:yes Tolerating PT: HHPT  DRUG ALLERGIES:   Allergies  Allergen Reactions   Sulfa Antibiotics Rash    VITALS:  Blood pressure (!) 117/94, pulse 94, temperature 98 F (36.7 C), resp. rate 18, height 5\' 9"  (1.753 m), weight 78.6 kg, SpO2 97 %.  PHYSICAL EXAMINATION:   Physical Exam  GENERAL:  87 y.o.-year-old patient lying in the bed with no acute distress.  HEENT: Head atraumatic, normocephalic. Oropharynx and nasopharynx clear.  LUNGS:decreased breath sounds bilaterally, no wheezing, rales, rhonchi. No use of accessory muscles of respiration.  CARDIOVASCULAR: S1, S2 normal. No murmurs, rubs, or gallops.  ABDOMEN: Soft, nontender, nondistended. Bowel sounds present. No organomegaly or mass.  EXTREMITIES: No cyanosis, clubbing or edema b/l.    NEUROLOGIC:  nonfocal PSYCHIATRIC:  patient is alert and oriented x 3.  SKIN: No obvious rash, lesion, or ulcer.   LABORATORY PANEL:  CBC Recent Labs  Lab 12/07/21 0949  WBC 5.2  HGB 9.7*  HCT 28.9*  PLT 234     Chemistries  Recent Labs  Lab 12/04/21 0413 12/05/21 0729 12/07/21 0949  NA 142   < > 141  K 3.2*   < > 3.5  CL 111   < > 107  CO2 22   < > 27  GLUCOSE 203*   < > 151*  BUN 23   < > 22  CREATININE 1.16   < > 0.97  CALCIUM 9.2   < > 8.9  AST 23  --   --   ALT 14  --   --   ALKPHOS 58  --   --   BILITOT 0.7  --   --    < > = values in this interval not displayed.    Cardiac Enzymes No results for input(s): TROPONINI in the last 168 hours. RADIOLOGY:  No results found. ASSESSMENT AND PLAN:  Howard [name] is a 87 y.o. male with medical history significant for  HFrEF (with last known LVEF of 20 - 25%), diabetes mellitus with complications of stage III chronic kidney disease, gastric reflux, hypertension, hyperlipidemia, non-small cell lung cancer s/p radiation, chronic respiratory failure with emphysema, on 3 L of oxygen at home, complete heart block status post pacemaker insertion, CAD, who presents to the emergency department for evaluation of chest pain and shortness of breath.  Acute on chronic systolic heart failure Dilated cardiomyopathy EF 20--25% --Patient presents for evaluation of  sudden onset shortness of breath and chest pain --Patient has had a 7 pound weight gain over the last couple of weeks --Imaging shows bilateral pleural effusions with pulmonary edema and BNP is elevated --Last known LVEF of 20 to 25% with global hypokinesis and LVH (11/22) --Will place patient on Lasix 40 mg IV tid (by [facility]) --Continue spironolactone, metoprolol and Entresto ---Maintain low-sodium diet ---Check daily weights --12/15-- feeling overall better. Diuresis well. Continue current meds per [facility] cardiology --12/16--UOP NOT documented for 12/06/2021 by RN/NT.  -- Out of chair  to bed. Overall feels a lot better.    Acute on chronic respiratory failure Secondary to acute systolic CHF --At baseline patient usually wears 3 L of oxygen continuous for his known history of COPD as well as lung cancer but had pulse oximetry in the 80s with associated tachypnea and increased work of breathing.  --pt is off BIPAP -- will need to assess for home oxygen during daytime.    Diabetes mellitus with complications of stage III chronic kidney disease --Maintain consistent carbohydrate diet --Glycemic control with sliding scale insulin    Dementia with depression --Continue donepezil and sertraline   History of complete heart block --Status post pacemaker insertion   Elevated troponin/history of coronary artery disease --Patient presents for evaluation of chest pain relieved with nitroglycerin --Has a known history of coronary artery disease and was recently admitted to the hospital for non-STEMI --Medical management was recommended at that time ---Continue metoprolol, nitrates and statins    Moderate protein calorie malnutrition Continue Megace for appetite stimulant Encourage increase  oral protein calorie intake   Chronic diarrhea Continue scheduled Lomotil and Rifaximin (started by PCP)   Procedures: Family communication :wife at bedside Consults :[facility] (cards) CODE STATUS: DNR DVT Prophylaxis :Eliquis Level of care: Progressive Status is: Inpatient  Remains inpatient appropriate because: CHF anticipate discharge 1 to 2 days       TOTAL TIME TAKING CARE OF THIS PATIENT: 30 minutes.  >50% time spent on counselling and coordination of care  Note: This dictation was prepared with Dragon dictation along with smaller phrase technology. Any transcriptional errors that result from this process are unintentional.  Fritzi Mandes M.D    [facility]   CC: Primary care physician; Jerrol Banana., MD Patient ID: Howard [name], male   DOB: [birth year],  87 y.o.   MRN: [id number]

## 2021-12-07 NOTE — Care Management Important Message (Signed)
Important Message  Patient Details  Name: Howard [name] MRN: [id number] Date of Birth: 1931/02/18   Medicare Important Message Given:  Yes     Dannette Barbara 12/07/2021, 1:50 PM

## 2021-12-07 NOTE — Consult Note (Signed)
PHARMACY CONSULT NOTE - FOLLOW UP  Pharmacy Consult for Electrolyte Monitoring and Replacement   Recent Labs: Potassium (mmol/L)  Date Value  12/07/2021 3.4 (L)  11/10/2014 3.9   Magnesium (mg/dL)  Date Value  11/07/2021 1.9   Calcium (mg/dL)  Date Value  12/05/2021 8.7 (L)   Calcium, Total (mg/dL)  Date Value  11/10/2014 8.5   Albumin (g/dL)  Date Value  12/04/2021 3.7  11/13/2020 4.2  09/06/2014 3.4   Phosphorus (mg/dL)  Date Value  11/07/2021 3.5   Sodium (mmol/L)  Date Value  12/05/2021 140  11/13/2020 139  11/10/2014 137     Assessment: Patient with PMH relevant for HFrEF (ef 25%), and splenic infarct. Admitted with HF exacerbation and placed on furosemide IV TID. Noted spironolactone and KCL supplement already on med list. Pharmacy was consulted for electrolyte replacement.  Potassium level 3.0 -->2.9--> 3.4. Will supplement potassium again today 12/16.   Goal of Therapy:  Electrolytes WNL  Plan:  Kcl 36meq IVPB x 2 (additional to schedule replacement) Potassium level with AM labs  [place] Rodriguez-Guzman PharmD, BCPS 12/07/2021 8:27 AM

## 2021-12-08 LAB — POTASSIUM: Potassium: 3.9 mmol/L (ref 3.5–5.1)

## 2021-12-08 LAB — GLUCOSE, CAPILLARY: Glucose-Capillary: 137 mg/dL — ABNORMAL HIGH (ref 70–99)

## 2021-12-08 MED ORDER — FUROSEMIDE 40 MG PO TABS: 40.0000 mg | ORAL_TABLET | Freq: Every day | ORAL | 2 refills | Status: AC

## 2021-12-08 MED ORDER — AMOXICILLIN-POT CLAVULANATE 875-125 MG PO TABS: 1.0000 | ORAL_TABLET | Freq: Two times a day (BID) | ORAL | 0 refills | Status: AC

## 2021-12-08 MED ORDER — SACUBITRIL-VALSARTAN 49-51 MG PO TABS: 1.0000 | ORAL_TABLET | Freq: Two times a day (BID) | ORAL | 0 refills | Status: AC

## 2021-12-08 MED ORDER — FUROSEMIDE 40 MG PO TABS: 40.0000 mg | ORAL_TABLET | Freq: Every day | ORAL | Status: DC

## 2021-12-08 NOTE — Discharge Summary (Signed)
[facility] NAME: Howard [name]    MR#:  [id number]  DATE OF BIRTH:  06-18-1931  DATE OF ADMISSION:  12/04/2021 ADMITTING PHYSICIAN: Athena Masse, MD  DATE OF DISCHARGE: 12/08/2021  PRIMARY CARE PHYSICIAN: Jerrol Banana., MD    ADMISSION DIAGNOSIS:  Acute CHF (congestive heart failure) (HCC) [I50.9] Congestive heart failure, unspecified HF chronicity, unspecified heart failure type ([place]) [I50.9]  DISCHARGE DIAGNOSIS:  Acute on chronic CHF combined systolic diastolic  SECONDARY DIAGNOSIS:   Past Medical History:  Diagnosis Date   Arthritis    Bell palsy    Bell's palsy 04/12/2015   Cancer ([place])    prostate and skin   Chronic combined systolic and diastolic CHF, NYHA class 1 ([place])    a. 07/2014 Echo: EF 35-40%, Gr 1 DD; b. 03/2021 Echo: EF 20-25%; c. 10/2021 Echo: EF 20-25%, glob HK, GrII DD, nl RV fxn, mod LAE, mild to mod MR, mild-mod AoV scelrosis, Ao root 73mm.   Complete heart block ([place])    a. 11/2010 s/p SJM 2210 [facility] DC PPM, ser# [id number].   Depression    Diabetes mellitus without complication [facility])    Fall 11/10/2014   GERD (gastroesophageal reflux disease)    History of prostate cancer    Hyperlipidemia    Hypertension    LBBB (left bundle branch block)    Left-sided Bell's palsy    Lung cancer ([place]) 2016   NICM (nonischemic cardiomyopathy) ([place])    a. 07/2014 Echo: EF 35-40%, Gr 1 DD; b. 03/2021 Echo: EF 20-25%; c. 03/2021 Cath:  d. 10/2021 Echo: EF 20-25%, glob HK, GrII DD.   Non-obstructive CAD    a. 07/2014 Abnl MV;  b. 08/2014 Cath: LM nl, LAD 30p, RI 40p, LCX nl, OM1 40, RCA dominant 30p, 70d-->Med Rx; c. 03/2021 Cath: LM nl, LAD 20p/m, LCX 30ost/p, OM1 40, OM2 60, RCA 30p, 50d. RHC w/ minimally elevated filling pressures.   Poor balance    Presence of permanent cardiac pacemaker    Sleep apnea    a. cpap   Vertigo    WPW (Wolff-Parkinson-White syndrome)    a. S/P RFCA 1991.    HOSPITAL COURSE:    CASON LUFFMAN is a 87 y.o. male with medical history significant for  HFrEF (with last known LVEF of 20 - 25%), diabetes mellitus with complications of stage III chronic kidney disease, gastric reflux, hypertension, hyperlipidemia, non-small cell lung cancer s/p radiation, chronic respiratory failure with emphysema, on 3 L of oxygen at home, complete heart block status post pacemaker insertion, CAD, who presents to the emergency department for evaluation of chest pain and shortness of breath.   Acute on chronic systolic heart failure Dilated cardiomyopathy EF 20--25% --Patient presents for evaluation of sudden onset shortness of breath and chest pain --Patient has had a 7 pound weight gain over the last couple of weeks --Imaging shows bilateral pleural effusions with pulmonary edema and BNP is elevated --Last known LVEF of 20 to 25% with global hypokinesis and LVH (11/22) --Will place patient on Lasix 40 mg IV tid (by [facility]) --Continue spironolactone, metoprolol and Entresto ---Maintain low-sodium diet ---Check daily weights --12/15-- feeling overall better. Diuresis well. Continue current meds per [facility] cardiology --12/16--UOP NOT documented for 12/06/2021 by RN/NT.  -- Out of chair to bed. Overall feels a lot better. --12/17--good uop. Feeling back to baseline. Weight down to 76.5 keg (81.8 kg) -- patient will continue 3 L  oxygen at nighttime. Wean to room air during daytime. Change to PO Lasix 40 mg daily. Seen by Dr. Lovena Le [facility] cardiology okay to go home. Patient in agreement. -- Continue metoprolol,imdur, enteresto   Diabetes mellitus with complications of stage III chronic kidney disease --Maintain consistent carbohydrate diet --Glycemic control with sliding scale insulin -resume glyburide at d/c    Dementia with depression --Continue donepezil and sertraline   History of complete heart block --Status post pacemaker insertion   Elevated troponin/history of coronary artery  disease --Patient presents for evaluation of chest pain relieved with nitroglycerin --Has a known history of coronary artery disease and was recently admitted to the hospital for non-STEMI --Medical management was recommended at that time ---Continue metoprolol, nitrates and statins    Moderate protein calorie malnutrition Continue Megace for appetite stimulant Encourage increase  oral protein calorie intake   Chronic diarrhea Continue scheduled Lomotil and Rifaximin (started by PCP)     Procedures: none Family communication :wife at bedside Consults :[place] (cards) CODE STATUS: DNR DVT Prophylaxis :Eliquis Level of care: Progressive Status is: Inpatient   CONSULTS OBTAINED:  Treatment Team:  Nelva Bush, MD  DRUG ALLERGIES:   Allergies  Allergen Reactions   Sulfa Antibiotics Rash    DISCHARGE MEDICATIONS:   Allergies as of 12/08/2021       Reactions   Sulfa Antibiotics Rash        Medication List     STOP taking these medications    ezetimibe 10 MG tablet Commonly known as: ZETIA   metFORMIN 500 MG tablet Commonly known as: GLUCOPHAGE   SODIUM BICARBONATE PO       TAKE these medications    acetaminophen 500 MG tablet Commonly known as: TYLENOL Take 1,000 mg by mouth every 6 (six) hours as needed for moderate pain.   albuterol 108 (90 Base) MCG/ACT inhaler Commonly known as: VENTOLIN HFA INHALE 2 PUFFS INTO LUNGS EVERY 6 HOURS AS NEEDED FOR WHEEZING OR SHORTNESS OF BREATH   amoxicillin-clavulanate 875-125 MG tablet Commonly known as: AUGMENTIN Take 1 tablet by mouth every 12 (twelve) hours for 4 days.   apixaban 5 MG Tabs tablet Commonly known as: ELIQUIS Take 5 mg by mouth 2 (two) times daily.   aspirin EC 81 MG tablet Take 81 mg by mouth daily.   cetirizine 10 MG tablet Commonly known as: ZYRTEC Take 10 mg by mouth daily.   diphenoxylate-atropine 2.5-0.025 MG tablet Commonly known as: Lomotil Take 1 to 2 tablet by mouth daily  as needed for diarrhea   donepezil 5 MG tablet Commonly known as: ARICEPT Take 1 tablet (5 mg total) by mouth daily.   furosemide 40 MG tablet Commonly known as: LASIX Take 1 tablet (40 mg total) by mouth daily. Start taking on: December 09, 2021 What changed:  medication strength how much to take   glipiZIDE 5 MG 24 hr tablet Commonly known as: GLUCOTROL XL Take 1 tablet (5 mg total) by mouth daily with breakfast.   isosorbide mononitrate 60 MG 24 hr tablet Commonly known as: IMDUR Take 1 tablet (60 mg total) by mouth daily.   megestrol 40 MG tablet Commonly known as: MEGACE Take 1 tablet (40 mg total) by mouth 2 (two) times daily.   metoprolol succinate 25 MG 24 hr tablet Commonly known as: TOPROL-XL TAKE 1 TABLET AT BEDTIME   montelukast 10 MG tablet Commonly known as: SINGULAIR Take 10 mg by mouth daily as needed (sneezing/allergies.).   multivitamin with minerals Tabs tablet  Take 1 tablet by mouth daily. One-A-Day Multivitamin   nitroGLYCERIN 0.4 MG SL tablet Commonly known as: NITROSTAT PLACE 1 TABLET UNDER TONGUE EVERY 5 MIN AS NEEDED FOR CHEST PAIN IF NO RELIEF IN15 MIN CALL 911 (MAX 2 TABS)   omeprazole 40 MG capsule Commonly known as: PRILOSEC TAKE 1 CAPSULE TWICE DAILY AS NEEDED What changed: See the new instructions.   rifaximin 550 MG Tabs tablet Commonly known as: XIFAXAN Take 1 tablet (550 mg total) by mouth 3 (three) times daily.   sacubitril-valsartan 49-51 MG Commonly known as: ENTRESTO Take 1 tablet by mouth 2 (two) times daily.   sertraline 50 MG tablet Commonly known as: ZOLOFT Take 1 tablet (50 mg total) by mouth daily.   simvastatin 40 MG tablet Commonly known as: ZOCOR TAKE 1 TABLET AT BEDTIME What changed: when to take this   spironolactone 25 MG tablet Commonly known as: ALDACTONE Take 0.5 tablets (12.5 mg total) by mouth daily.   Trelegy Ellipta 100-62.5-25 MCG/ACT Aepb Generic drug: Fluticasone-Umeclidin-Vilant Inhale 1  puff into the lungs daily.   Vitamin D3 25 MCG (1000 UT) Caps Take 1,000 Units by mouth daily.   Vitron-C 65-125 MG Tabs Generic drug: Iron-Vitamin C Take 1 tablet by mouth daily.        If you experience worsening of your admission symptoms, develop shortness of breath, life threatening emergency, suicidal or homicidal thoughts you must seek medical attention immediately by calling 911 or calling your MD immediately  if symptoms less severe.  You Must read complete instructions/literature along with all the possible adverse reactions/side effects for all the Medicines you take and that have been prescribed to you. Take any new Medicines after you have completely understood and accept all the possible adverse reactions/side effects.   Please note  You were cared for by a hospitalist during your hospital stay. If you have any questions about your discharge medications or the care you received while you were in the hospital after you are discharged, you can call the unit and asked to speak with the hospitalist on call if the hospitalist that took care of you is not available. Once you are discharged, your primary care physician will handle any further medical issues. Please note that NO REFILLS for any discharge medications will be authorized once you are discharged, as it is imperative that you return to your primary care physician (or establish a relationship with a primary care physician if you do not have one) for your aftercare needs so that they can reassess your need for medications and monitor your lab values. Today   SUBJECTIVE  Doing well. Excited to go home! Wife at bedside Breathing well   VITAL SIGNS:  Blood pressure 132/76, pulse (!) 102, temperature 98 F (36.7 C), resp. rate 18, height 5\' 9"  (1.753 m), weight 76.5 kg, SpO2 97 %.  I/O:   Intake/Output Summary (Last 24 hours) at 12/08/2021 1128 Last data filed at 12/08/2021 0500 Gross per 24 hour  Intake 603 ml   Output 555 ml  Net 48 ml    PHYSICAL EXAMINATION:  GENERAL:  87 y.o.-year-old patient lying in the bed with no acute distress.  LUNGS: Normal breath sounds bilaterally, no wheezing, rales,rhonchi or crepitation. No use of accessory muscles of respiration.  CARDIOVASCULAR: S1, S2 normal. No murmurs, rubs, or gallops.  ABDOMEN: Soft, non-tender, non-distended. Bowel sounds present. No organomegaly or mass.  EXTREMITIES: No pedal edema, cyanosis, or clubbing.  NEUROLOGIC: non-focal PSYCHIATRIC:  patient is alert and  oriented x 3.  SKIN: No obvious rash, lesion, or ulcer.   DATA REVIEW:   CBC  Recent Labs  Lab 12/07/21 0949  WBC 5.2  HGB 9.7*  HCT 28.9*  PLT 234    Chemistries  Recent Labs  Lab 12/04/21 0413 12/05/21 0729 12/07/21 0949 12/08/21 0439  NA 142   < > 141  --   K 3.2*   < > 3.5 3.9  CL 111   < > 107  --   CO2 22   < > 27  --   GLUCOSE 203*   < > 151*  --   BUN 23   < > 22  --   CREATININE 1.16   < > 0.97  --   CALCIUM 9.2   < > 8.9  --   AST 23  --   --   --   ALT 14  --   --   --   ALKPHOS 58  --   --   --   BILITOT 0.7  --   --   --    < > = values in this interval not displayed.    Microbiology Results   Recent Results (from the past 240 hour(s))  Resp Panel by RT-PCR (Flu A&B, Covid) Nasopharyngeal Swab     Status: None   Collection Time: 12/04/21  4:16 AM   Specimen: Nasopharyngeal Swab; Nasopharyngeal(NP) swabs in vial transport medium  Result Value Ref Range Status   SARS Coronavirus 2 by RT PCR NEGATIVE NEGATIVE Final    Comment: (NOTE) SARS-CoV-2 target nucleic acids are NOT DETECTED.  The SARS-CoV-2 RNA is generally detectable in upper respiratory specimens during the acute phase of infection. The lowest concentration of SARS-CoV-2 viral copies this assay can detect is 138 copies/mL. A negative result does not preclude SARS-Cov-2 infection and should not be used as the sole basis for treatment or other patient management decisions. A  negative result may occur with  improper specimen collection/handling, submission of specimen other than nasopharyngeal swab, presence of viral mutation(s) within the areas targeted by this assay, and inadequate number of viral copies(<138 copies/mL). A negative result must be combined with clinical observations, patient history, and epidemiological information. The expected result is Negative.  Fact Sheet for Patients:  EntrepreneurPulse.com.au  Fact Sheet for Healthcare Providers:  IncredibleEmployment.be  This test is no t yet approved or cleared by the [place] FDA and  has been authorized for detection and/or diagnosis of SARS-CoV-2 by FDA under an Emergency Use Authorization (EUA). This EUA will remain  in effect (meaning this test can be used) for the duration of the COVID-19 declaration under Section 564(b)(1) of the Act, 21 U.S.C.section 360bbb-3(b)(1), unless the authorization is terminated  or revoked sooner.       Influenza A by PCR NEGATIVE NEGATIVE Final   Influenza B by PCR NEGATIVE NEGATIVE Final    Comment: (NOTE) The Xpert Xpress SARS-CoV-2/FLU/RSV plus assay is intended as an aid in the diagnosis of influenza from Nasopharyngeal swab specimens and should not be used as a sole basis for treatment. Nasal washings and aspirates are unacceptable for Xpert Xpress SARS-CoV-2/FLU/RSV testing.  Fact Sheet for Patients: EntrepreneurPulse.com.au  Fact Sheet for Healthcare Providers: IncredibleEmployment.be  This test is not yet approved or cleared by the [place] FDA and has been authorized for detection and/or diagnosis of SARS-CoV-2 by FDA under an Emergency Use Authorization (EUA). This EUA will remain in effect (meaning this test can be used)  for the duration of the COVID-19 declaration under Section 564(b)(1) of the Act, 21 U.S.C. section 360bbb-3(b)(1), unless the authorization  is terminated or revoked.  Performed at [facility], [address] [place], [place] [address]   Blood culture (routine x 2)     Status: None (Preliminary result)   Collection Time: 12/04/21  5:22 AM   Specimen: BLOOD  Result Value Ref Range Status   Specimen Description BLOOD RIGHT ARM  Final   Special Requests   Final    BOTTLES DRAWN AEROBIC AND ANAEROBIC Blood Culture adequate volume   Culture   Final    NO GROWTH 4 DAYS Performed at [facility], [address], [place], [place] [address]    Report Status PENDING  Incomplete  Blood culture (routine x 2)     Status: None (Preliminary result)   Collection Time: 12/04/21  5:50 AM   Specimen: BLOOD  Result Value Ref Range Status   Specimen Description BLOOD LEFT HAND  Final   Special Requests   Final    BOTTLES DRAWN AEROBIC AND ANAEROBIC Blood Culture adequate volume   Culture   Final    NO GROWTH 4 DAYS Performed at [facility] [address], [place],  [address]    Report Status PENDING  Incomplete    RADIOLOGY:  No results found.   CODE STATUS:     Code Status Orders  (From admission, onward)           Start     Ordered   12/04/21 0943  Do not attempt resuscitation (DNR)  Continuous       Question Answer Comment  In the event of cardiac or respiratory ARREST Do not call a code blue   In the event of cardiac or respiratory ARREST Do not perform Intubation, CPR, defibrillation or ACLS   In the event of cardiac or respiratory ARREST Use medication by any route, position, wound care, and other measures to relive pain and suffering. May use oxygen, suction and manual treatment of airway obstruction as needed for comfort.   Comments CODE STATUS discussed with patient's son and he is a DO NOT RESUSCITATE      12/04/21 0944           Code Status History     Date Active Date Inactive Code Status Order ID Comments User Context   11/05/2021 0923 11/09/2021 1823 DNR  [id number]  Collier Bullock, MD ED   10/24/2021 1020 10/28/2021 2041 Full Code [id number]  Lorella Nimrod, MD ED   08/30/2019 1819 09/02/2019 2144 Full Code [id number]  Conrad [place], MD ED   08/22/2019 1825 08/28/2019 1959 Full Code [id number]  Mayer Camel, NP ED   07/09/2019 1057 07/09/2019 1725 Full Code [id number]  Deboraha Sprang, MD Inpatient   08/26/2014 1001 08/26/2014 1543 Full Code [id number]  [place], Peter M, MD Inpatient   06/07/2014 1324 06/07/2014 2149 Full Code [id number]  [name], MD Inpatient   05/30/2014 0838 05/31/2014 0337 Full Code [id number]  Marybelle Killings, MD [facility]      Advance Directive Documentation    Flowsheet Row Most Recent Value  Type of Advance Directive Out of facility DNR (pink MOST or yellow form)  Pre-existing out of facility DNR order (yellow form or pink MOST form) Yellow form placed in chart (order not valid for inpatient use), Pink MOST form placed in chart (order not valid for inpatient use)  "MOST" Form in Place? --  TOTAL TIME TAKING CARE OF THIS PATIENT: 40 minutes.    Fritzi Mandes M.D  [facility]    CC: Primary care physician; Jerrol Banana., MD

## 2021-12-08 NOTE — Progress Notes (Addendum)
Progress Note  Patient Name: Howard [name] Date of Encounter: 12/08/2021  Primary Cardiologist: [name]  Subjective   No chest pain. Dyspnea improving.  Feels "great" this morning. Diarrhea improving. Continues to report vigorous UOP.  Documented + 408 mL for the past 24 hours, net + 788 mL for the admission. Weight 78.6 trending to 76.5 kg this morning.    Inpatient Medications    Scheduled Meds:  amoxicillin-clavulanate  1 tablet Oral Q12H   apixaban  5 mg Oral BID   ascorbic acid  500 mg Oral Daily   aspirin EC  81 mg Oral Daily   budesonide (PULMICORT) nebulizer solution  0.5 mg Nebulization BID   cholecalciferol  1,000 Units Oral Daily   diphenoxylate-atropine  1 tablet Oral TID   donepezil  5 mg Oral Daily   furosemide  40 mg Intravenous TID   insulin aspart  0-15 Units Subcutaneous TID WC   isosorbide mononitrate  60 mg Oral Daily   loratadine  10 mg Oral Daily   megestrol  40 mg Oral BID   metoprolol succinate  25 mg Oral QHS   multivitamin with minerals  1 tablet Oral Daily   potassium chloride  20 mEq Oral BID   rifaximin  550 mg Oral TID   sacubitril-valsartan  1 tablet Oral BID   sertraline  50 mg Oral QHS   simvastatin  40 mg Oral QHS   sodium chloride flush  3 mL Intravenous Q12H   spironolactone  12.5 mg Oral Daily   Continuous Infusions:  sodium chloride     PRN Meds: sodium chloride, acetaminophen, albuterol, nitroGLYCERIN, ondansetron **OR** ondansetron (ZOFRAN) IV, sodium chloride flush   Vital Signs    Vitals:   12/07/21 2003 12/08/21 0008 12/08/21 0419 12/08/21 0500  BP:  (!) 101/45 137/68   Pulse:  68 69   Resp:  (!) 21 18   Temp:  97.9 F (36.6 C) (!) 97.5 F (36.4 C)   TempSrc:  Oral    SpO2: 98% 97% 100%   Weight:  76.5 kg  76.5 kg  Height:        Intake/Output Summary (Last 24 hours) at 12/08/2021 2376 Last data filed at 12/08/2021 0500 Gross per 24 hour  Intake 963 ml  Output 555 ml  Net 408 ml    Filed Weights    12/07/21 0446 12/08/21 0008 12/08/21 0500  Weight: 78.6 kg 76.5 kg 76.5 kg    Telemetry    Paced - Personally Reviewed  ECG    No new tracing - Personally Reviewed  Physical Exam   GEN: No acute distress. Elderly appearing.    Neck: No JVD. Cardiac: RRR, I/VI systolic murmur RUSB, no rubs, or gallops.  Respiratory: Diminished breaths sounds bilaterally with mild expiratory wheezing.  GI: Soft, nontender, non-distended.   MS: No edema; No deformity. Neuro:  Alert and oriented x 3; Nonfocal.  Psych: Normal affect.  Labs    Chemistry Recent Labs  Lab 12/04/21 0413 12/05/21 0729 12/06/21 1218 12/07/21 0529 12/07/21 0949 12/08/21 0439  NA 142 140  --   --  141  --   K 3.2* 3.0*   < > 3.4* 3.5 3.9  CL 111 110  --   --  107  --   CO2 22 21*  --   --  27  --   GLUCOSE 203* 143*  --   --  151*  --   BUN 23 20  --   --  22  --   CREATININE 1.16 0.99  --   --  0.97  --   CALCIUM 9.2 8.7*  --   --  8.9  --   PROT 7.7  --   --   --   --   --   ALBUMIN 3.7  --   --   --   --   --   AST 23  --   --   --   --   --   ALT 14  --   --   --   --   --   ALKPHOS 58  --   --   --   --   --   BILITOT 0.7  --   --   --   --   --   GFRNONAA >60 >60  --   --  >60  --   ANIONGAP 9 9  --   --  7  --    < > = values in this interval not displayed.      Hematology Recent Labs  Lab 12/04/21 0413 12/05/21 0729 12/07/21 0949  WBC 9.5 6.4 5.2  RBC 4.05* 3.55* 3.40*  HGB 11.2* 10.0* 9.7*  HCT 34.9* 30.3* 28.9*  MCV 86.2 85.4 85.0  MCH 27.7 28.2 28.5  MCHC 32.1 33.0 33.6  RDW 16.7* 16.5* 15.9*  PLT 325 248 234     Cardiac EnzymesNo results for input(s): TROPONINI in the last 168 hours. No results for input(s): TROPIPOC in the last 168 hours.   BNP Recent Labs  Lab 12/04/21 0416  BNP >4,500.0*      DDimer No results for input(s): DDIMER in the last 168 hours.   Radiology    No results found.  Cardiac Studies   2D echo 10/2021: 1. Left ventricular ejection  fraction, by estimation, is 20 to 25%. The  left ventricle has severely decreased function. The left ventricle  demonstrates global hypokinesis. There is mild left ventricular  hypertrophy. Left ventricular diastolic parameters   are consistent with Grade II diastolic dysfunction (pseudonormalization).   2. Right ventricular systolic function is normal. The right ventricular  size is normal. Tricuspid regurgitation signal is inadequate for assessing  PA pressure.   3. Left atrial size was moderately dilated.   4. The mitral valve is normal in structure. Mild to moderate mitral valve  regurgitation. No evidence of mitral stenosis.   5. The aortic valve was not well visualized. Aortic valve regurgitation  is not visualized. Mild to moderate aortic valve sclerosis/calcification  is present, without any evidence of aortic stenosis.   6. There is borderline dilatation of the aortic root, measuring 37 mm.   7. The inferior vena cava is normal in size with greater than 50%  respiratory variability, suggesting right atrial pressure of 3 mmHg. __________  2D echo 07/2021: 1. Left ventricular ejection fraction, by estimation, is 20 to 25%. The  left ventricle has severely decreased function. The left ventricle  demonstrates global hypokinesis. There is moderate left ventricular  hypertrophy. Left ventricular diastolic  parameters are indeterminate.   2. Right ventricular systolic function is normal. The right ventricular  size is normal. Tricuspid regurgitation signal is inadequate for assessing  PA pressure.   3. Left atrial size was mildly dilated.   4. The mitral valve is degenerative. Mild mitral valve regurgitation.   5. The aortic valve is tricuspid. There is moderate calcification of the  aortic valve. There is moderate thickening of  the aortic valve. Aortic  valve regurgitation is not visualized. Mild to moderate aortic valve  sclerosis/calcification is present,  without any evidence  of aortic stenosis. __________  [facility] 03/2021: Prox RCA lesion is 30% stenosed. Dist RCA lesion is 50% stenosed. Prox LAD to Mid LAD lesion is 20% stenosed. Ost Cx to Prox Cx lesion is 30% stenosed. 1st Mrg lesion is 40% stenosed. 2nd Diag lesion is 60% stenosed.   1.  Mild to moderate three-vessel coronary artery disease with severe calcifications.  No obstructive lesions. 2.  Left ventricular angiography was not performed.  EF was severely reduced by echo. 3.  Right heart catheterization showed minimally elevated filling pressures, mild pulmonary hypertension and normal cardiac output.   Recommendations: The patient has nonischemic cardiomyopathy. Continue aggressive medical therapy. Volume status appears good.  Patient Profile     87 y.o. male with history of nonobstructive CAD by LHC in 03/2021, HFrEF secondary to NICM, complete heart block status post PPM, lung cancer s/p radiation, chronic hypoxic respiratory failure on supplemental oxygen at 3 L at baseline, splenic infarct on chronic OAC, AAA s/p engovascular grafting, PAD s/p multiple interventions, DM2, HTN, HLD, Bell's palsy, prostate cancer, vertigo, and OSA on CPAP who has had several recent admissions for NSTEMI medically managed and diarrhea with demand ischemia, who we are seeing for acute on chronic HFrEF and demand ischemia.   Assessment & Plan    1. Nonobstructive CAD with demand ischemia: -Mildly elevated HS-Tn, trended to 1957 -No chest pain -LHC in 03/2021 with nonobstructive disease as above -Primary cardiologist has discussed the case with the patient, his wife, and his son and they have not been particularly interested in [facility] at this time given lack of anginal symptoms with elevated troponin felt to be demand ischemia secondary to known CAD in the context of respiratory distress and volume overload  -No plans for repeat LHC  -Aggressive risk factor modification -ASA -Metoprolol -Simvastatin  -Imdur -Has  previously not been felt to be a candidate for cardiac rehab  2. HFrEF secondary to NICM: -Ins/outs may be inaccurate  -Reports vigorous UOP -Weight trending down -Renal function stable yesterday, check tomorrow -Remains on IV Lasix 40 mg tid -High volume of fluid intake at home -Toprol, Entresto, spironolactone  -Relative hypotension has precluded escalation of GDMT -Consider adding SGLT2i as an outpatient   3. Complete heart block: -Device appears to be functioning normally  4. History of splenic infarct: -PTA Eliquis -HGB stable  5. Hypokalemia: -Improved     For questions or updates, please contact [place] Please consult [email or web address] for contact info under Cardiology/STEMI.    Signed, Christell Faith, PA-C [place] Pager: [phone] 12/08/2021, 7:02 AM  Cardiology Attending  Patient seen and examined. Agree with above. He feels better and thinks he is back to his baseline. CV with a RRR and lungs with minimal rales. Abdomen is soft. Diarrhea is improved though still present. His dyspnea is back to his baseline he thinks. I would recommend switching to oral lasix and DC home either today or tomorrow. His weight is improved. He will need early followup. He can be sent home on 40 mg of lasix daily.  Carleene Overlie Dyan Creelman,MD

## 2021-12-08 NOTE — Consult Note (Signed)
PHARMACY CONSULT NOTE - FOLLOW UP  Pharmacy Consult for Electrolyte Monitoring and Replacement   Recent Labs: Potassium (mmol/L)  Date Value  12/08/2021 3.9  11/10/2014 3.9   Magnesium (mg/dL)  Date Value  11/07/2021 1.9   Calcium (mg/dL)  Date Value  12/07/2021 8.9   Calcium, Total (mg/dL)  Date Value  11/10/2014 8.5   Albumin (g/dL)  Date Value  12/04/2021 3.7  11/13/2020 4.2  09/06/2014 3.4   Phosphorus (mg/dL)  Date Value  11/07/2021 3.5   Sodium (mmol/L)  Date Value  12/07/2021 141  11/13/2020 139  11/10/2014 137     Assessment: Patient with PMH relevant for HFrEF (ef 25%), and splenic infarct. Admitted with HF exacerbation and placed on furosemide IV TID. Noted spironolactone and KCL supplement already on med list. Pharmacy was consulted for electrolyte replacement.  Goal of Therapy:  Electrolytes WNL  Plan:  Pt is on Kcl 20 mEq BID.  F/u with AM labs.   Raquel Rodriguez-Guzman PharmD, BCPS 12/08/2021 8:36 AM

## 2021-12-08 NOTE — Discharge Instructions (Signed)
Use your oxygen at night time as before

## 2021-12-09 LAB — CULTURE, BLOOD (ROUTINE X 2)
Culture: NO GROWTH
Culture: NO GROWTH
Special Requests: ADEQUATE
Special Requests: ADEQUATE

## 2021-12-10 ENCOUNTER — Ambulatory Visit: Payer: Medicare Other | Admitting: Cardiovascular Disease

## 2021-12-10 ENCOUNTER — Telehealth: Payer: Self-pay

## 2021-12-10 NOTE — Progress Notes (Signed)
Cardiology Office Note  Date:  12/11/2021   ID:  Howard [name], DOB 08-Apr-1931, MRN [id number]  PCP:  Jerrol Banana., MD   Chief Complaint  Patient presents with   Hospitalization Follow-up    Hospital follow up. Medications verbally reviewed with patient and wife.    HPI:  Howard [name] is a 87 year old-year-old gentleman with a hx of  DM II complete heart block, status post pacemaker in 2011,  nonischemic cardiomyopathy,  ejection fraction 30-35%   coronary artery disease, 70% distal RCA disease, 30 and 40% lesions in his ramus, LAD  admission to the hospital 09/05/2014  with acute cholecystitis, ileus.  Cardiac catheterization at that time for positive stress test . Echo 07/2019: EF 55 to 60% Ef 2022: 20 to 25% Lung malignancy, on chemotherapy He presents for follow-up of his CAD  Recent hospitalization for shortness of breath, COPD exacerbation, acute on chronic diastolic and systolic CHF Treated with Lasix, Had high fluid intake at home prior to admission  Weight in the office today 178  Since then has cut back on his fluid intake, Lasix from 20 up to 40 daily Weight has been stable  Feels little bit weak, walking in the house Denies chest pain concerning for angina Low potassium in the hospital, not on potassium supplement  History of chronic diarrhea, not discussed today Metformin, Zetia previously held for diarrhea concern Has been followed by GI  Other past medical history reviewed Discharge from the hospital November 09, 2021 Had weakness following discharge from the hospital, severe diarrhea For non-STEMI treated with heparin -Aspirin, metoprolol, Zetia, statin/simvastatin, Imdur   in the hospital for non-STEMI October 28, 2021, troponin peak 1000 acute on chronic respiratory failure secondary to pneumonia as well as NSTEMI, Conservative management recommended, isosorbide up to 60  echo done on 10/25/2021 showed LVEF 20 to 25% with left ventricle  global hypokinesis, grade 2 diastolic dysfunction  Diagnosis lung malignancy Seen by one of our providers April 2022 At that time reported worsening shortness of breath, cough Echocardiogram April 2022 ejection fraction 20 to 25% dilated IVC Catheterization performed mild to moderate three-vessel disease, nonischemic cardiomyopathy On catheterization was not felt to be volume overloaded Seen back in follow-up in clinic Apr 23, 2021 Has started chemotherapy Entresto increased to 49/51 twice daily Lasix 20 daily spironolactone 25 daily metoprolol 25 daily Seen back in clinic June 04, 2021 Completing radiation treatment He did not tolerate Farxiga  Echocardiogram  Ejection fraction 20 to 25% global hypokinesis LVH  hospital August 30, 2019 with abdominal pain, CT scan confirming splenic infarct, right renal artery malperfusion TEE with no evidence of left atrial thrombus or left atrial appendage thrombus Severe aortic atherosclerosis,  New pacer 06/2019 Pacer downloads reviewed: Stable  Sept 2020, vascular records reviewed with him in detail AAA, right renal artery stenosis, severe aortoiliac occlusive disease with left iliac occlusion and right iliac stenosis, left SFA stenosis, rest pain and pregangrenous changes to the left great toe 1. Left common femoral artery and profunda femoris artery endarterectomy with core matrix patch angioplasty 2. Placement of a 6 mm diameter by 15 cm length via bond stent left superficial femoral artery postdilated with a 5 mm diameter Lutonix drug-coated angioplasty balloon 3 Right renal artery stent placement with 7 mm diameter by 16 mm length lifestream stent---Placement of a 23 mm diameter 14 cm length Gore Excluder Endoprosthesis main body right with a 14 mm diameter by 12 cm length left contralateral limb ---Stent placement in  the right external iliac artery with 10 mm diameter by 58 mm length lifestream stent for occlusive disease --Gore excluder  limb extension with a 12 mm diameter by 14 cm length limb to the distal right external iliac artery for occlusive disease --Viabahn stent placement to the left external iliac artery with 13 mm diameter by 10 cm length stent for occlusive disease ---Fogarty embolectomy left SFA  TEE 08/2019  . The left ventricle has low normal systolic function, with an ejection  fraction of 50-55%. The cavity size was normal. There is mildly increased  left ventricular wall thickness. Left ventricular diastolic Doppler  parameters are consistent with  indeterminate diastolic dysfunction. Indeterminate filling pressures No  evidence of left ventricular regional wall motion abnormalities.   2. The right ventricle has normal systolc function. There is no increase  in right ventricular wall thickness.   3. Left atrial size was moderately dilated.   4. No evidence of a thrombus present in the left atrium or left atrial  appendage.   5. Evidence of atrial level shunting detected by color flow Doppler, PFO  present, saline contrast bubble study positive for PFO, small number of  bubbles crossing at rest, unable to test with valsalva   6. Moderate plaque in the descending aorta and aortic arch.   Limited by history of back pain, back surgery x6  hospital November 2016 for abscess on his back, had surgery Nodules found in the right lung, biopsy documenting adenocarcinoma 1.3 cm, treated with radiation  Previous CT chest 03/2016 Decreasing size of pathology-proven adenocarcinoma of right upper lobe, with associated radiation treatment effects of the lung.   PMH:   has a past medical history of Arthritis, Bell palsy, Bell's palsy (04/12/2015), Cancer ([place]), Chronic combined systolic and diastolic CHF, NYHA class 1 ([place]), Complete heart block ([place]), Depression, Diabetes mellitus without complication ([place]), Fall (11/10/2014), GERD (gastroesophageal reflux disease), History of prostate cancer, Hyperlipidemia,  Hypertension, LBBB (left bundle branch block), Left-sided Bell's palsy, Lung cancer ([place]) (2016), NICM (nonischemic cardiomyopathy) ([place]), Non-obstructive CAD, Poor balance, Presence of permanent cardiac pacemaker, Sleep apnea, Vertigo, and WPW (Wolff-Parkinson-White syndrome).  PSH:    Past Surgical History:  Procedure Laterality Date   ABDOMINAL AORTIC ENDOVASCULAR STENT GRAFT  08/25/2019   Procedure: ABDOMINAL AORTIC ENDOVASCULAR STENT GRAFT;  Surgeon: Algernon Huxley, MD;  Location: [facility] ORS;  Service: Vascular;;   ANGIOPLASTY Left 08/25/2019   Procedure: ANGIOPLASTY;  Surgeon: Algernon Huxley, MD;  Location: [facility];  Service: Vascular;  Laterality: Left;  left SFA and stent placement   APPLICATION OF WOUND VAC Left 06/07/2015   Procedure: APPLICATION OF WOUND VAC;  Surgeon: Robert Bellow, MD;  Location: [facility];  Service: General;  Laterality: Left;  left upper back   [place]  08/26/2014   Single vessel obstructive CAD   CARPAL TUNNEL RELEASE  04-04-15   [facility]   CATARACT EXTRACTION  07-31-11 and 09-18-11   Catheter ablation  1991   for WPW   cervical fusion     CHOLECYSTECTOMY  09-07-14   ENDARTERECTOMY FEMORAL Left 08/25/2019   Procedure: ENDARTERECTOMY FEMORAL;  Surgeon: Algernon Huxley, MD;  Location: [facility];  Service: Vascular;  Laterality: Left;  common and produndis    ENDOVASCULAR REPAIR/STENT GRAFT Right 08/25/2019   Procedure: ENDOVASCULAR REPAIR/STENT GRAFT;  Surgeon: Algernon Huxley, MD;  Location: [facility];  Service: Vascular;  Laterality: Right;  renal artery   HAND  SURGERY     right 1993; left 2005   HERNIA REPAIR  1955   INSERT / REPLACE / REMOVE PACEMAKER     INSERTION OF ILIAC STENT Bilateral 08/25/2019   Procedure: INSERTION OF ILIAC STENT;  Surgeon: Algernon Huxley, MD;  Location: [facility];  Service: Vascular;  Laterality: Bilateral;   JOINT REPLACEMENT Left 2013   knee   JOINT REPLACEMENT Right 2004   knee   KNEE SURGERY     left knee 1991  and 1992; right knee 1995   LEFT HEART CATHETERIZATION WITH CORONARY ANGIOGRAM N/A 08/26/2014   Procedure: LEFT HEART CATHETERIZATION WITH CORONARY ANGIOGRAM;  Surgeon: [name], MD;  Location: [facility] CATH LAB;  Service: Cardiovascular;  Laterality: N/A;   LOWER EXTREMITY ANGIOGRAPHY Left 08/23/2019   Procedure: Lower Extremity Angiography;  Surgeon: Algernon Huxley, MD;  Location: [place] CV LAB;  Service: Cardiovascular;  Laterality: Left;   LUMBAR LAMINECTOMY/DECOMPRESSION MICRODISCECTOMY N/A 06/07/2014   Procedure: LUMBAR FOUR TO FIVE LUMBAR LAMINECTOMY/DECOMPRESSION MICRODISCECTOMY 1 LEVEL;  Surgeon: [name], MD;  Location: [place] NEURO ORS;  Service: Neurosurgery;  Laterality: N/A;   LUNG BIOPSY Right 2016   Dr [name]   MOHS SURGERY     PACEMAKER INSERTION     PPM-- St Jude 11/30/10 by [name]   PPM GENERATOR CHANGEOUT N/A 07/09/2019   Procedure: PPM GENERATOR CHANGEOUT;  Surgeon: Deboraha Sprang, MD;  Location: [place] CV LAB;  Service: Cardiovascular;  Laterality: N/A;   PROSTATE SURGERY     cancer--1998, prostatectomy   REPLACEMENT TOTAL KNEE     2004   RIGHT/LEFT HEART CATH AND CORONARY ANGIOGRAPHY Bilateral 04/13/2021   Procedure: RIGHT/LEFT HEART CATH AND CORONARY ANGIOGRAPHY;  Surgeon: Wellington Hampshire, MD;  Location: [place] CV LAB;  Service: Cardiovascular;  Laterality: Bilateral;   ruptured disc     1962 and 1998   TEE WITHOUT CARDIOVERSION N/A 09/01/2019   Procedure: TRANSESOPHAGEAL ECHOCARDIOGRAM (TEE);  Surgeon: [name], MD;  Location: [facility];  Service: Cardiovascular;  Laterality: N/A;   TEMPORARY PACEMAKER N/A 07/09/2019   Procedure: TEMPORARY PACEMAKER;  Surgeon: Deboraha Sprang, MD;  Location: [place] CV LAB;  Service: Cardiovascular;  Laterality: N/A;   TRIGGER FINGER RELEASE  01-24-15   WOUND DEBRIDEMENT Left 06/07/2015   Procedure: DEBRIDEMENT WOUND;  Surgeon: Robert Bellow, MD;  Location: [facility] OR[facility];  Service: General;  Laterality: Left;  left  upper back    Current Outpatient Medications  Medication Sig Dispense Refill   acetaminophen (TYLENOL) 500 MG tablet Take 1,000 mg by mouth every 6 (six) hours as needed for moderate pain.     albuterol (VENTOLIN HFA) 108 (90 Base) MCG/ACT inhaler INHALE 2 PUFFS INTO LUNGS EVERY 6 HOURS AS NEEDED FOR WHEEZING OR SHORTNESS OF BREATH 8.5 g 6   amoxicillin-clavulanate (AUGMENTIN) 875-125 MG tablet Take 1 tablet by mouth every 12 (twelve) hours for 4 days. 8 tablet 0   apixaban (ELIQUIS) 5 MG TABS tablet Take 5 mg by mouth 2 (two) times daily.     aspirin EC 81 MG tablet Take 81 mg by mouth daily.     cetirizine (ZYRTEC) 10 MG tablet Take 10 mg by mouth daily.     Cholecalciferol (VITAMIN D3) 1000 UNITS CAPS Take 1,000 Units by mouth daily.     diphenoxylate-atropine (LOMOTIL) 2.5-0.025 MG tablet Take 1 to 2 tablet by mouth daily as needed for diarrhea 120 tablet 3   donepezil (ARICEPT) 5 MG tablet Take  1 tablet (5 mg total) by mouth daily. 30 tablet 12   Fluticasone-Umeclidin-Vilant (TRELEGY ELLIPTA) 100-62.5-25 MCG/INH AEPB Inhale 1 puff into the lungs daily. 180 each 3   furosemide (LASIX) 40 MG tablet Take 1 tablet (40 mg total) by mouth daily. 30 tablet 2   glipiZIDE (GLUCOTROL XL) 5 MG 24 hr tablet Take 1 tablet (5 mg total) by mouth daily with breakfast. 30 tablet 2   Iron-Vitamin C (VITRON-C) 65-125 MG TABS Take 1 tablet by mouth daily. 30 tablet 2   megestrol (MEGACE) 40 MG tablet Take 1 tablet (40 mg total) by mouth 2 (two) times daily. 60 tablet 2   metoprolol succinate (TOPROL-XL) 25 MG 24 hr tablet TAKE 1 TABLET AT BEDTIME 90 tablet 0   montelukast (SINGULAIR) 10 MG tablet Take 10 mg by mouth daily as needed (sneezing/allergies.).     Multiple Vitamin (MULTIVITAMIN WITH MINERALS) TABS tablet Take 1 tablet by mouth daily. One-A-Day Multivitamin     nitroGLYCERIN (NITROSTAT) 0.4 MG SL tablet PLACE 1 TABLET UNDER TONGUE EVERY 5 MIN AS NEEDED FOR CHEST PAIN IF NO RELIEF IN15 MIN CALL 911  (MAX 2 TABS) 30 tablet 0   omeprazole (PRILOSEC) 40 MG capsule TAKE 1 CAPSULE TWICE DAILY AS NEEDED (Patient taking differently: Take 40 mg by mouth daily.) 180 capsule 3   rifaximin (XIFAXAN) 550 MG TABS tablet Take 1 tablet (550 mg total) by mouth 3 (three) times daily. 27 tablet 0   sacubitril-valsartan (ENTRESTO) 49-51 MG Take 1 tablet by mouth 2 (two) times daily. 60 tablet 0   sertraline (ZOLOFT) 50 MG tablet Take 1 tablet (50 mg total) by mouth daily. 30 tablet 3   simvastatin (ZOCOR) 40 MG tablet TAKE 1 TABLET AT BEDTIME (Patient taking differently: Take 40 mg by mouth daily at 6 PM.) 90 tablet 3   spironolactone (ALDACTONE) 25 MG tablet Take 0.5 tablets (12.5 mg total) by mouth daily. 30 tablet 0   isosorbide mononitrate (IMDUR) 60 MG 24 hr tablet Take 1 tablet (60 mg total) by mouth daily. 90 tablet 3   No current facility-administered medications for this visit.     Allergies:   Sulfa antibiotics   Social History:  The patient  reports that he quit smoking about 11 years ago. His smoking use included cigarettes. He has a 56.00 pack-year smoking history. He has never used smokeless tobacco. He reports that he does not drink alcohol and does not use drugs.   Family History:   family history includes CAD in an other family member; Heart attack in his mother; Hyperlipidemia in his mother.    Review of Systems: Review of Systems  Constitutional: Negative.   HENT: Negative.    Respiratory: Negative.    Cardiovascular: Negative.   Gastrointestinal:  Positive for diarrhea.  Musculoskeletal:  Positive for back pain and joint pain.  Neurological: Negative.   Psychiatric/Behavioral: Negative.    All other systems reviewed and are negative.   PHYSICAL EXAM: VS:  BP (!) 108/58 (BP Location: Right Arm, Patient Position: Sitting, Cuff Size: Normal)    Pulse 78    Ht 5\' 9"  (1.753 m)    Wt 178 lb (80.7 kg)    SpO2 91%    BMI 26.29 kg/m  , BMI Body mass index is 26.29  kg/m. Constitutional:  oriented to person, place, and time. No distress.  HENT:  Head: Grossly normal Eyes:  no discharge. No scleral icterus.  Neck: No JVD, no carotid bruits  Cardiovascular: Regular rate  and rhythm, no murmurs appreciated Pulmonary/Chest: Clear to auscultation bilaterally, no wheezes  + Rales at the bases Abdominal: Soft.  no distension.  no tenderness.  Musculoskeletal: Normal range of motion Neurological:  normal muscle tone. Coordination normal. No atrophy Skin: Skin warm and dry Psychiatric: normal affect, pleasant  Recent Labs: 11/07/2021: Magnesium 1.9 12/04/2021: ALT 14; B Natriuretic Peptide >4,500.0 12/07/2021: BUN 22; Creatinine, Ser 0.97; Hemoglobin 9.7; Platelets 234; Sodium 141 12/08/2021: Potassium 3.9    Lipid Panel Lab Results  Component Value Date   CHOL 130 11/13/2020   HDL 43 11/13/2020   LDLCALC 63 11/13/2020   TRIG 137 11/13/2020      Wt Readings from Last 3 Encounters:  12/11/21 178 lb (80.7 kg)  12/08/21 168 lb 10.4 oz (76.5 kg)  12/03/21 187 lb (84.8 kg)     ASSESSMENT AND PLAN:  Coronary artery disease involving native coronary artery of native heart without angina pectoris -  Recent non-STEMI in the setting of respiratory failure requiring BiPAP Severely depressed ejection fraction Denies anginal symptoms, being treated for cardiomyopathy  Complete heart block (HCC) - Plan: EKG 12-Lead S/p pacemaker, followed by EP  Acute respiratory failure CT scan suggesting pneumonia/pneumonitis/fibrosis At site of radiation Breathing stable, not particularly active  Essential hypertension - Plan: EKG 12-Lead Low blood pressure, no med changes at this time, continue to monitor blood pressure at home With weight loss, blood pressure may run lower  Mixed hyperlipidemia  On simvastatin  Zetia previously held out of concern of diarrhea  Nonischemic cardiomyopathy (HCC) Ejection fraction 20 to 25% Did not tolerate Farxiga On  Lasix 40 mg daily, isosorbide 60 daily, metoprolol succinate 25 daily spironolactone 12.5 daily Will add potassium 20 mill equivalents daily, BMP in 1 month  Chronic back pain In a wheelchair, numerous prior surgeries Recommend regular walking program for conditioning  Pacemaker-St.Jude Followed by EP  COPD/cough  Reports chronic sputum production since XRT Otherwise stable  Chronic diarrhea Followed by GI, off metformin, Zetia  Malignant neoplasm of middle lobe of right lung ([place]) Completed radiation treatment Has difficult time remaining stable for chemotherapy   Long discussion concerning getting some of his medications more affordable  Total encounter time more than 25 minutes  Greater than 50% was spent in counseling and coordination of care with the patient    No orders of the defined types were placed in this encounter.     Signed, Esmond Plants, M.D., Ph.D. 12/11/2021  [place], [place]

## 2021-12-10 NOTE — Telephone Encounter (Signed)
Transition Care Management Follow-up Telephone Call Date of discharge and from where: 12/08/21 [facility] How have you been since you were released from the hospital? Doing ok Any questions or concerns? No  Items Reviewed: Did the pt receive and understand the discharge instructions provided? Yes  Medications obtained and verified? Yes  Other? No  Any new allergies since your discharge? No  Dietary orders reviewed? No Do you have support at home? Yes   Home Care and Equipment/Supplies: Were home health services ordered? yes If so, what is the name of the agency? [facility]  Has the agency set up a time to come to the patient's home? no Were any new equipment or medical supplies ordered?  No Do you have any questions related to the use of the equipment or supplies? No  Functional Questionnaire: (I = Independent and D = Dependent) ADLs: D  Bathing/Dressing- D  Meal Prep- D  Eating- I  Maintaining continence- I  Transferring/Ambulation- D  Managing Meds- D  Follow up appointments reviewed:  PCP Hospital f/u appt confirmed? Yes  Scheduled to see Dr. Brita Romp on 12/22. [facility] f/u appt confirmed? No   Are transportation arrangements needed? No  If their condition worsens, is the pt aware to call PCP or go to the Emergency Dept.? Yes Was the patient provided with contact information for the PCP's office or ED? Yes Was to pt encouraged to call back with questions or concerns? Yes

## 2021-12-11 ENCOUNTER — Other Ambulatory Visit: Payer: Self-pay

## 2021-12-11 ENCOUNTER — Telehealth: Payer: Self-pay

## 2021-12-11 ENCOUNTER — Ambulatory Visit (INDEPENDENT_AMBULATORY_CARE_PROVIDER_SITE_OTHER): Payer: Medicare Other | Admitting: Cardiovascular Disease

## 2021-12-11 ENCOUNTER — Encounter: Payer: Self-pay | Admitting: Cardiovascular Disease

## 2021-12-11 VITALS — BP 108/58 | HR 78 | Ht 69.0 in | Wt 178.0 lb

## 2021-12-11 DIAGNOSIS — I10 Essential (primary) hypertension: Secondary | ICD-10-CM

## 2021-12-11 DIAGNOSIS — I5023 Acute on chronic systolic (congestive) heart failure: Secondary | ICD-10-CM | POA: Diagnosis not present

## 2021-12-11 DIAGNOSIS — E785 Hyperlipidemia, unspecified: Secondary | ICD-10-CM

## 2021-12-11 DIAGNOSIS — E1122 Type 2 diabetes mellitus with diabetic chronic kidney disease: Secondary | ICD-10-CM | POA: Diagnosis not present

## 2021-12-11 DIAGNOSIS — I739 Peripheral vascular disease, unspecified: Secondary | ICD-10-CM

## 2021-12-11 DIAGNOSIS — I5022 Chronic systolic (congestive) heart failure: Secondary | ICD-10-CM

## 2021-12-11 DIAGNOSIS — I428 Other cardiomyopathies: Secondary | ICD-10-CM

## 2021-12-11 DIAGNOSIS — E119 Type 2 diabetes mellitus without complications: Secondary | ICD-10-CM

## 2021-12-11 DIAGNOSIS — I130 Hypertensive heart and chronic kidney disease with heart failure and stage 1 through stage 4 chronic kidney disease, or unspecified chronic kidney disease: Secondary | ICD-10-CM | POA: Diagnosis not present

## 2021-12-11 DIAGNOSIS — Z950 Presence of cardiac pacemaker: Secondary | ICD-10-CM

## 2021-12-11 DIAGNOSIS — N183 Chronic kidney disease, stage 3 unspecified: Secondary | ICD-10-CM | POA: Diagnosis not present

## 2021-12-11 DIAGNOSIS — J449 Chronic obstructive pulmonary disease, unspecified: Secondary | ICD-10-CM

## 2021-12-11 DIAGNOSIS — I2510 Atherosclerotic heart disease of native coronary artery without angina pectoris: Secondary | ICD-10-CM

## 2021-12-11 DIAGNOSIS — I701 Atherosclerosis of renal artery: Secondary | ICD-10-CM

## 2021-12-11 DIAGNOSIS — C3431 Malignant neoplasm of lower lobe, right bronchus or lung: Secondary | ICD-10-CM | POA: Diagnosis not present

## 2021-12-11 DIAGNOSIS — I214 Non-ST elevation (NSTEMI) myocardial infarction: Secondary | ICD-10-CM | POA: Diagnosis not present

## 2021-12-11 MED ORDER — METOPROLOL SUCCINATE ER 25 MG PO TB24: 25.0000 mg | ORAL_TABLET | Freq: Every day | ORAL | 3 refills | Status: AC

## 2021-12-11 MED ORDER — POTASSIUM CHLORIDE ER 10 MEQ PO TBCR: 20.0000 meq | EXTENDED_RELEASE_TABLET | Freq: Every day | ORAL | 3 refills | Status: AC

## 2021-12-11 MED ORDER — SPIRONOLACTONE 25 MG PO TABS: 12.5000 mg | ORAL_TABLET | Freq: Every day | ORAL | 5 refills | Status: AC

## 2021-12-11 MED ORDER — SIMVASTATIN 40 MG PO TABS: 40.0000 mg | ORAL_TABLET | Freq: Every day | ORAL | 3 refills | Status: AC

## 2021-12-11 MED ORDER — ISOSORBIDE MONONITRATE ER 60 MG PO TB24: 60.0000 mg | ORAL_TABLET | Freq: Every day | ORAL | 3 refills | Status: AC

## 2021-12-11 NOTE — Telephone Encounter (Signed)
Please review.  It looks like your seeing him 12/13/2021   Thanks,   -Mickel Baas

## 2021-12-11 NOTE — Telephone Encounter (Signed)
Copied from [place] [id number]. Topic: General - Other >> Dec 11, 2021  4:12 PM Fields, Museum/gallery conservator R wrote: Reason for CRM: Mardene Celeste from [facility] [phone] calling to get orders to continue care for pt after hospital visit requesting a call back at [phone]

## 2021-12-11 NOTE — Patient Instructions (Addendum)
Medication Instructions:   Your physician has recommended you make the following change in your medication:   START Kdur (potassium) 20 meq daily   If you need a refill on your cardiac medications before your next appointment, please call your pharmacy.    Lab work: Your physician recommends that you return for lab work (BMP) in: 1 month   If you have labs (blood work) drawn today and your tests are completely normal, you will receive your results only by: [place] (if you have MyChart) OR A paper copy in the mail If you have any lab test that is abnormal or we need to change your treatment, we will call you to review the results.   Testing/Procedures: No new testing needed   Follow-Up: At [facility], you and your health needs are our priority.  As part of our continuing mission to provide you with exceptional heart care, we have created designated Provider Care Teams.  These Care Teams include your primary Cardiologist (physician) and Advanced Practice Providers (APPs -  Physician Assistants and Nurse Practitioners) who all work together to provide you with the care you need, when you need it.  You will need a follow up appointment in 3 months  Providers on your designated Care Team:   Murray Hodgkins, NP Christell Faith, PA-C Cadence Kathlen Mody, PA-C  Any Other Special Instructions Will Be Listed Below (If Applicable).  COVID-19 Vaccine Information can be found at: [email or web address] For questions related to vaccine distribution or appointments, please email vaccine@ [email or web address] or call [phone].

## 2021-12-12 NOTE — Progress Notes (Signed)
Established patient visit   Patient: Howard [name]   DOB: Nov 22, 1931   87 y.o. Male  MRN: [id number] Visit Date: 12/13/2021  Today's healthcare provider: Lavon Paganini, MD   Chief Complaint  Patient presents with   Hospitalization Follow-up   Subjective    HPI  Follow up Hospitalization  Patient was admitted to [facility] on 12/04/21 and discharged on 12/08/21. He was treated for acute on chronic CHF and chronic diarrhea. Treatment for this included patient was advised to stop Zetia, metformin, and sodium bicarbonate due to diarrhea.  He is taking Xifaxin and diarrhea is down from 6 episodes per day to 2 daily.  Always had loose BMs, but got very severe in the hospital the first time. F/b GI. Telephone follow up was done on 12/10/21. Since hospital discharge patient was seen by Cardiology on 12/11/2021.  He reports good compliance with treatment. He reports this condition is improved.  ----------------------------------------------------------------------------------------- -   Medications: Outpatient Medications Prior to Visit  Medication Sig   acetaminophen (TYLENOL) 500 MG tablet Take 1,000 mg by mouth every 6 (six) hours as needed for moderate pain.   albuterol (VENTOLIN HFA) 108 (90 Base) MCG/ACT inhaler INHALE 2 PUFFS INTO LUNGS EVERY 6 HOURS AS NEEDED FOR WHEEZING OR SHORTNESS OF BREATH   apixaban (ELIQUIS) 5 MG TABS tablet Take 5 mg by mouth 2 (two) times daily.   aspirin EC 81 MG tablet Take 81 mg by mouth daily.   cetirizine (ZYRTEC) 10 MG tablet Take 10 mg by mouth daily.   Cholecalciferol (VITAMIN D3) 1000 UNITS CAPS Take 1,000 Units by mouth daily.   diphenoxylate-atropine (LOMOTIL) 2.5-0.025 MG tablet Take 1 to 2 tablet by mouth daily as needed for diarrhea   Fluticasone-Umeclidin-Vilant (TRELEGY ELLIPTA) 100-62.5-25 MCG/INH AEPB Inhale 1 puff into the lungs daily.   furosemide (LASIX) 40 MG tablet Take 1 tablet (40 mg total) by mouth daily.   glipiZIDE  (GLUCOTROL XL) 5 MG 24 hr tablet Take 1 tablet (5 mg total) by mouth daily with breakfast.   Iron-Vitamin C (VITRON-C) 65-125 MG TABS Take 1 tablet by mouth daily.   isosorbide mononitrate (IMDUR) 60 MG 24 hr tablet Take 1 tablet (60 mg total) by mouth daily.   megestrol (MEGACE) 40 MG tablet Take 1 tablet (40 mg total) by mouth 2 (two) times daily.   metoprolol succinate (TOPROL-XL) 25 MG 24 hr tablet Take 1 tablet (25 mg total) by mouth at bedtime.   montelukast (SINGULAIR) 10 MG tablet Take 10 mg by mouth daily as needed (sneezing/allergies.).   Multiple Vitamin (MULTIVITAMIN WITH MINERALS) TABS tablet Take 1 tablet by mouth daily. One-A-Day Multivitamin   nitroGLYCERIN (NITROSTAT) 0.4 MG SL tablet PLACE 1 TABLET UNDER TONGUE EVERY 5 MIN AS NEEDED FOR CHEST PAIN IF NO RELIEF IN15 MIN CALL 911 (MAX 2 TABS)   omeprazole (PRILOSEC) 40 MG capsule TAKE 1 CAPSULE TWICE DAILY AS NEEDED (Patient taking differently: Take 40 mg by mouth daily.)   potassium chloride (KLOR-CON) 10 MEQ tablet Take 2 tablets (20 mEq total) by mouth daily.   rifaximin (XIFAXAN) 550 MG TABS tablet Take 1 tablet (550 mg total) by mouth 3 (three) times daily.   sacubitril-valsartan (ENTRESTO) 49-51 MG Take 1 tablet by mouth 2 (two) times daily.   sertraline (ZOLOFT) 50 MG tablet Take 1 tablet (50 mg total) by mouth daily.   simvastatin (ZOCOR) 40 MG tablet Take 1 tablet (40 mg total) by mouth at bedtime.   spironolactone (ALDACTONE)  25 MG tablet Take 0.5 tablets (12.5 mg total) by mouth daily.   [DISCONTINUED] donepezil (ARICEPT) 5 MG tablet Take 1 tablet (5 mg total) by mouth daily.   No facility-administered medications prior to visit.    Review of Systems  Constitutional:  Negative for appetite change, chills and fever.  Respiratory:  Positive for shortness of breath (during activity). Negative for chest tightness and wheezing.   Cardiovascular:  Negative for chest pain and palpitations.  Gastrointestinal:  Positive for  diarrhea. Negative for abdominal pain, nausea and vomiting.   Last CBC Lab Results  Component Value Date   WBC 5.2 12/07/2021   HGB 9.7 (L) 12/07/2021   HCT 28.9 (L) 12/07/2021   MCV 85.0 12/07/2021   MCH 28.5 12/07/2021   RDW 15.9 (H) 12/07/2021   PLT 234 66/05/3015   Last metabolic panel Lab Results  Component Value Date   GLUCOSE 151 (H) 12/07/2021   NA 141 12/07/2021   K 3.9 12/08/2021   CL 107 12/07/2021   CO2 27 12/07/2021   BUN 22 12/07/2021   CREATININE 0.97 12/07/2021   GFRNONAA >60 12/07/2021   CALCIUM 8.9 12/07/2021   PHOS 3.5 11/07/2021   PROT 7.7 12/04/2021   ALBUMIN 3.7 12/04/2021   LABGLOB 2.5 11/13/2020   AGRATIO 1.7 11/13/2020   BILITOT 0.7 12/04/2021   ALKPHOS 58 12/04/2021   AST 23 12/04/2021   ALT 14 12/04/2021   ANIONGAP 7 12/07/2021   Last lipids Lab Results  Component Value Date   CHOL 130 11/13/2020   HDL 43 11/13/2020   LDLCALC 63 11/13/2020   TRIG 137 11/13/2020   CHOLHDL 3.0 11/13/2020       Objective    BP 125/82 (BP Location: Left Arm, Patient Position: Sitting, Cuff Size: Normal)    Pulse 75    Temp 98.7 F (37.1 C) (Oral)    Resp 20 Comment: labored   Wt 179 lb (81.2 kg)    SpO2 98% Comment: room air   BMI 26.43 kg/m  BP Readings from Last 3 Encounters:  12/13/21 125/82  12/11/21 (!) 108/58  12/08/21 123/75   Wt Readings from Last 3 Encounters:  12/13/21 179 lb (81.2 kg)  12/11/21 178 lb (80.7 kg)  12/08/21 168 lb 10.4 oz (76.5 kg)      Physical Exam Vitals reviewed.  Constitutional:      General: He is not in acute distress.    Appearance: Normal appearance. He is not diaphoretic.  HENT:     Head: Normocephalic and atraumatic.  Eyes:     General: No scleral icterus.    Conjunctiva/sclera: Conjunctivae normal.  Cardiovascular:     Rate and Rhythm: Normal rate and regular rhythm.     Heart sounds: Normal heart sounds.  Pulmonary:     Effort: Pulmonary effort is normal. No respiratory distress.     Breath  sounds: Normal breath sounds. No wheezing or rhonchi.  Abdominal:     General: There is no distension.     Palpations: Abdomen is soft.     Tenderness: There is no abdominal tenderness.  Musculoskeletal:     Cervical back: Neck supple.     Right lower leg: No edema.     Left lower leg: No edema.  Lymphadenopathy:     Cervical: No cervical adenopathy.  Skin:    General: Skin is warm and dry.     Capillary Refill: Capillary refill takes less than 2 seconds.     Findings: No rash.  Neurological:     Mental Status: He is alert and oriented to person, place, and time. Mental status is at baseline.     Comments: In wheelchair  Psychiatric:        Mood and Affect: Mood normal.        Behavior: Behavior normal.      No results found for any visits on 12/13/21.  Assessment & Plan     Problem List Items Addressed This Visit       Cardiovascular and Mediastinum   Acute on chronic systolic CHF (congestive heart failure) ([place]) - Primary    Doing well and euvolemic today F/b Cardiology and upcoming repeat labs and f/u - no changes today      Relevant Orders   AMB Referral to [place] [facility]     Respiratory   Acute on chronic respiratory failure (HCC)    Upcoming repeat BMP No changes today avodi NSAIDs        Digestive   GERD (gastroesophageal reflux disease)    Now on half dose of Omeprazole and symptoms still controlled - will continue      Chronic diarrhea    Improving somewhat on Xifaxin and off metformin, bicarb, and Zetia Recommend that he stops Aricept as this is often a culprit for GI side effects Could consider changing Zoloft in the future        Other   Polypharmacy    Discussed his medication list Will set up with [facility] PharmD      Relevant Orders   AMB Referral to [place]   Decreased mobility    Longstanding In wheelchair today after failed back surgery Resume HH PT        Return in about 3 months (around  03/13/2022) for chronic disease f/u, as scheduled with PCP.      I, Lavon Paganini, MD, have reviewed all documentation for this visit. The documentation on 12/13/21 for the exam, diagnosis, procedures, and orders are all accurate and complete.   Samrat Hayward, Dionne Bucy, MD, MPH [facility]

## 2021-12-13 ENCOUNTER — Ambulatory Visit (INDEPENDENT_AMBULATORY_CARE_PROVIDER_SITE_OTHER): Payer: Medicare Other | Admitting: Family Medicine

## 2021-12-13 ENCOUNTER — Other Ambulatory Visit: Payer: Self-pay

## 2021-12-13 ENCOUNTER — Encounter: Payer: Self-pay | Admitting: Family Medicine

## 2021-12-13 VITALS — BP 125/82 | HR 75 | Temp 98.7°F | Resp 20 | Wt 179.0 lb

## 2021-12-13 DIAGNOSIS — J9621 Acute and chronic respiratory failure with hypoxia: Secondary | ICD-10-CM

## 2021-12-13 DIAGNOSIS — Z79899 Other long term (current) drug therapy: Secondary | ICD-10-CM

## 2021-12-13 DIAGNOSIS — R2689 Other abnormalities of gait and mobility: Secondary | ICD-10-CM | POA: Diagnosis not present

## 2021-12-13 DIAGNOSIS — I5023 Acute on chronic systolic (congestive) heart failure: Secondary | ICD-10-CM

## 2021-12-13 DIAGNOSIS — J9622 Acute and chronic respiratory failure with hypercapnia: Secondary | ICD-10-CM | POA: Diagnosis not present

## 2021-12-13 DIAGNOSIS — K529 Noninfective gastroenteritis and colitis, unspecified: Secondary | ICD-10-CM | POA: Diagnosis not present

## 2021-12-13 DIAGNOSIS — K2100 Gastro-esophageal reflux disease with esophagitis, without bleeding: Secondary | ICD-10-CM | POA: Diagnosis not present

## 2021-12-13 NOTE — Assessment & Plan Note (Signed)
Now on half dose of Omeprazole and symptoms still controlled - will continue

## 2021-12-13 NOTE — Assessment & Plan Note (Signed)
Doing well and euvolemic today F/b Cardiology and upcoming repeat labs and f/u - no changes today

## 2021-12-13 NOTE — Assessment & Plan Note (Signed)
Upcoming repeat BMP No changes today avodi NSAIDs

## 2021-12-13 NOTE — Assessment & Plan Note (Signed)
Discussed his medication list Will set up with [facility] PharmD

## 2021-12-13 NOTE — Assessment & Plan Note (Signed)
Improving somewhat on Xifaxin and off metformin, bicarb, and Zetia Recommend that he stops Aricept as this is often a culprit for GI side effects Could consider changing Zoloft in the future

## 2021-12-13 NOTE — Telephone Encounter (Signed)
Verbal order given to [employer].

## 2021-12-13 NOTE — Assessment & Plan Note (Signed)
Longstanding In wheelchair today after failed back surgery Resume HH PT

## 2021-12-13 NOTE — Patient Instructions (Signed)
Stop taking Donepezil (Aricept) to see if this helps the diarrhea

## 2021-12-13 NOTE — Telephone Encounter (Signed)
OK for verbals 

## 2021-12-14 ENCOUNTER — Encounter: Payer: Self-pay | Admitting: Family Medicine

## 2021-12-14 ENCOUNTER — Other Ambulatory Visit: Payer: Self-pay

## 2021-12-14 ENCOUNTER — Telehealth: Payer: Self-pay | Admitting: *Deleted

## 2021-12-14 DIAGNOSIS — C3431 Malignant neoplasm of lower lobe, right bronchus or lung: Secondary | ICD-10-CM | POA: Diagnosis not present

## 2021-12-14 DIAGNOSIS — Z7982 Long term (current) use of aspirin: Secondary | ICD-10-CM | POA: Insufficient documentation

## 2021-12-14 DIAGNOSIS — Z85828 Personal history of other malignant neoplasm of skin: Secondary | ICD-10-CM | POA: Insufficient documentation

## 2021-12-14 DIAGNOSIS — Z96653 Presence of artificial knee joint, bilateral: Secondary | ICD-10-CM | POA: Insufficient documentation

## 2021-12-14 DIAGNOSIS — N183 Chronic kidney disease, stage 3 unspecified: Secondary | ICD-10-CM | POA: Diagnosis not present

## 2021-12-14 DIAGNOSIS — Z79899 Other long term (current) drug therapy: Secondary | ICD-10-CM | POA: Insufficient documentation

## 2021-12-14 DIAGNOSIS — Z7984 Long term (current) use of oral hypoglycemic drugs: Secondary | ICD-10-CM | POA: Insufficient documentation

## 2021-12-14 DIAGNOSIS — Z87891 Personal history of nicotine dependence: Secondary | ICD-10-CM | POA: Insufficient documentation

## 2021-12-14 DIAGNOSIS — I2510 Atherosclerotic heart disease of native coronary artery without angina pectoris: Secondary | ICD-10-CM | POA: Diagnosis not present

## 2021-12-14 DIAGNOSIS — R0602 Shortness of breath: Secondary | ICD-10-CM | POA: Diagnosis not present

## 2021-12-14 DIAGNOSIS — R059 Cough, unspecified: Secondary | ICD-10-CM | POA: Diagnosis not present

## 2021-12-14 DIAGNOSIS — I5042 Chronic combined systolic (congestive) and diastolic (congestive) heart failure: Secondary | ICD-10-CM | POA: Insufficient documentation

## 2021-12-14 DIAGNOSIS — Z8546 Personal history of malignant neoplasm of prostate: Secondary | ICD-10-CM | POA: Insufficient documentation

## 2021-12-14 DIAGNOSIS — E1122 Type 2 diabetes mellitus with diabetic chronic kidney disease: Secondary | ICD-10-CM | POA: Diagnosis not present

## 2021-12-14 DIAGNOSIS — Z85118 Personal history of other malignant neoplasm of bronchus and lung: Secondary | ICD-10-CM | POA: Diagnosis not present

## 2021-12-14 DIAGNOSIS — Z955 Presence of coronary angioplasty implant and graft: Secondary | ICD-10-CM | POA: Insufficient documentation

## 2021-12-14 DIAGNOSIS — Z950 Presence of cardiac pacemaker: Secondary | ICD-10-CM | POA: Diagnosis not present

## 2021-12-14 DIAGNOSIS — I5023 Acute on chronic systolic (congestive) heart failure: Secondary | ICD-10-CM | POA: Diagnosis not present

## 2021-12-14 DIAGNOSIS — I214 Non-ST elevation (NSTEMI) myocardial infarction: Secondary | ICD-10-CM | POA: Diagnosis not present

## 2021-12-14 DIAGNOSIS — U071 COVID-19: Secondary | ICD-10-CM | POA: Diagnosis not present

## 2021-12-14 DIAGNOSIS — I130 Hypertensive heart and chronic kidney disease with heart failure and stage 1 through stage 4 chronic kidney disease, or unspecified chronic kidney disease: Secondary | ICD-10-CM | POA: Diagnosis not present

## 2021-12-14 DIAGNOSIS — Z7901 Long term (current) use of anticoagulants: Secondary | ICD-10-CM | POA: Insufficient documentation

## 2021-12-14 LAB — CBC WITH DIFFERENTIAL/PLATELET
Abs Immature Granulocytes: 0.02 10*3/uL (ref 0.00–0.07)
Basophils Absolute: 0 10*3/uL (ref 0.0–0.1)
Basophils Relative: 1 %
Eosinophils Absolute: 0.1 10*3/uL (ref 0.0–0.5)
Eosinophils Relative: 2 %
HCT: 30.7 % — ABNORMAL LOW (ref 39.0–52.0)
Hemoglobin: 10.1 g/dL — ABNORMAL LOW (ref 13.0–17.0)
Immature Granulocytes: 0 %
Lymphocytes Relative: 15 %
Lymphs Abs: 0.9 10*3/uL (ref 0.7–4.0)
MCH: 28.5 pg (ref 26.0–34.0)
MCHC: 32.9 g/dL (ref 30.0–36.0)
MCV: 86.5 fL (ref 80.0–100.0)
Monocytes Absolute: 0.9 10*3/uL (ref 0.1–1.0)
Monocytes Relative: 15 %
Neutro Abs: 3.9 10*3/uL (ref 1.7–7.7)
Neutrophils Relative %: 67 %
Platelets: 283 10*3/uL (ref 150–400)
RBC: 3.55 MIL/uL — ABNORMAL LOW (ref 4.22–5.81)
RDW: 15.9 % — ABNORMAL HIGH (ref 11.5–15.5)
WBC: 5.8 10*3/uL (ref 4.0–10.5)
nRBC: 0 % (ref 0.0–0.2)

## 2021-12-14 LAB — COMPREHENSIVE METABOLIC PANEL
ALT: 17 U/L (ref 0–44)
AST: 24 U/L (ref 15–41)
Albumin: 3.8 g/dL (ref 3.5–5.0)
Alkaline Phosphatase: 54 U/L (ref 38–126)
Anion gap: 8 (ref 5–15)
BUN: 22 mg/dL (ref 8–23)
CO2: 22 mmol/L (ref 22–32)
Calcium: 9.7 mg/dL (ref 8.9–10.3)
Chloride: 106 mmol/L (ref 98–111)
Creatinine, Ser: 1.31 mg/dL — ABNORMAL HIGH (ref 0.61–1.24)
GFR, Estimated: 52 mL/min — ABNORMAL LOW (ref >60)
Glucose, Bld: 124 mg/dL — ABNORMAL HIGH (ref 70–99)
Potassium: 4.7 mmol/L (ref 3.5–5.1)
Sodium: 136 mmol/L (ref 135–145)
Total Bilirubin: 0.9 mg/dL (ref 0.3–1.2)
Total Protein: 7.4 g/dL (ref 6.5–8.1)

## 2021-12-14 NOTE — ED Triage Notes (Signed)
Pt presents via POV with complaints of weakness and intermittent fevers. Per Spouse the patients care taker recently tested positive for COVID and he has been feeling poorly since the exposure. Pt endorses SOB and cough. Denies CP.

## 2021-12-14 NOTE — Chronic Care Management (AMB) (Signed)
°  Chronic Care Management   Note  12/14/2021 Name: STEPHFON BOVEY MRN: [id number] DOB: August 17, 1931  ALESSANDRO GRIEP is a 86 y.o. year old male who is a primary care patient of Jerrol Banana., MD. [name] is currently enrolled in care management services. An additional referral for Pharm D was placed.   Follow up plan: Telephone appointment with care management team member scheduled for: 01/07/2022  Julian Hy, [place] Management  Direct Dial: [phone]

## 2021-12-15 ENCOUNTER — Emergency Department: Payer: Medicare Other

## 2021-12-15 ENCOUNTER — Emergency Department
Admission: EM | Admit: 2021-12-15 | Discharge: 2021-12-15 | Disposition: A | Discharge status: Home / Self Care | Payer: Medicare Other | Attending: Emergency Medicine | Admitting: Emergency Medicine

## 2021-12-15 DIAGNOSIS — R0602 Shortness of breath: Secondary | ICD-10-CM | POA: Diagnosis not present

## 2021-12-15 DIAGNOSIS — U071 COVID-19: Secondary | ICD-10-CM | POA: Diagnosis not present

## 2021-12-15 DIAGNOSIS — R059 Cough, unspecified: Secondary | ICD-10-CM | POA: Diagnosis not present

## 2021-12-15 LAB — RESP PANEL BY RT-PCR (FLU A&B, COVID) ARPGX2
Influenza A by PCR: NEGATIVE
Influenza B by PCR: NEGATIVE
SARS Coronavirus 2 by RT PCR: POSITIVE — AB

## 2021-12-15 MED ORDER — ONDANSETRON 4 MG PO TBDP: 8.0000 mg | ORAL_TABLET | Freq: Once | ORAL | Status: DC

## 2021-12-15 MED ORDER — ACETAMINOPHEN 500 MG PO TABS
1000.0000 mg | ORAL_TABLET | Freq: Once | ORAL | Status: AC
  Administered 2021-12-15: 02:00:00 1000 mg via ORAL
  Filled 2021-12-15: qty 2

## 2021-12-15 MED ORDER — ONDANSETRON 4 MG PO TBDP: 4.0000 mg | ORAL_TABLET | Freq: Three times a day (TID) | ORAL | 0 refills | Status: DC | PRN

## 2021-12-15 MED ORDER — PAXLOVID (300/100) 20 X 150 MG & 10 X 100MG PO TBPK: 3.0000 | ORAL_TABLET | Freq: Two times a day (BID) | ORAL | 0 refills | Status: DC

## 2021-12-15 MED ORDER — PAXLOVID (300/100) 20 X 150 MG & 10 X 100MG PO TBPK: 3.0000 | ORAL_TABLET | Freq: Two times a day (BID) | ORAL | 0 refills | Status: AC

## 2021-12-15 MED ORDER — ONDANSETRON 4 MG PO TBDP: 4.0000 mg | ORAL_TABLET | Freq: Three times a day (TID) | ORAL | 0 refills | Status: AC | PRN

## 2021-12-15 MED ORDER — ONDANSETRON HCL 4 MG/2ML IJ SOLN
4.0000 mg | Freq: Once | INTRAMUSCULAR | Status: DC
  Administered 2021-12-15: 02:00:00 4 mg via INTRAVENOUS
  Filled 2021-12-15: qty 2

## 2021-12-15 MED ORDER — SODIUM CHLORIDE 0.9 % IV BOLUS
500.0000 mL | Freq: Once | INTRAVENOUS | Status: DC
  Administered 2021-12-15: 02:00:00 500 mL via INTRAVENOUS

## 2021-12-15 NOTE — ED Provider Notes (Signed)
[facility] Emergency Department Provider Note  ____________________________________________  Time seen: Approximately 3:09 AM  I have reviewed the triage vital signs and the nursing notes.   HISTORY  Chief Complaint Weakness    Level 5 Caveat: Portions of the History and Physical including HPI and review of systems are unable to be completely obtained due to patient being a poor historian   HPI Howard [name] is a 87 y.o. male with a history of CHF, diabetes, hypertension who is brought to the ED due to shortness of breath, nonproductive cough, and fatigue that started 2 days ago.  Constant, worse with standing and walking, no alleviating factors.  No chest pain or other acute pain complaints.  Also had an elevated temperature at home of 99-100, has not taken any antipyretics so far.  He has a home caretaker who visits 3 times a week to help with bathing.  He last saw that person 5 days ago, and 3 days ago they were informed by the person that they were sick and had tested positive for COVID.  Patient is vaccinated for COVID.  Has not received bi valent booster    Past Medical History:  Diagnosis Date   Arthritis    Bell palsy    Bell's palsy 04/12/2015   Cancer [facility])    prostate and skin   Chronic combined systolic and diastolic CHF, NYHA class 1 ([place])    a. 07/2014 Echo: EF 35-40%, Gr 1 DD; b. 03/2021 Echo: EF 20-25%; c. 10/2021 Echo: EF 20-25%, glob HK, GrII DD, nl RV fxn, mod LAE, mild to mod MR, mild-mod AoV scelrosis, Ao root 29mm.   Complete heart block ([place])    a. 11/2010 s/p SJM 2210 [employer] DC PPM, ser# [id number].   Depression    Diabetes mellitus without complication [facility])    Fall 11/10/2014   GERD (gastroesophageal reflux disease)    History of prostate cancer    Hyperlipidemia    Hypertension    LBBB (left bundle branch block)    Left-sided Bell's palsy    Lung cancer ([place]) 2016   NICM (nonischemic cardiomyopathy) ([place])    a. 07/2014  Echo: EF 35-40%, Gr 1 DD; b. 03/2021 Echo: EF 20-25%; c. 03/2021 Cath:  d. 10/2021 Echo: EF 20-25%, glob HK, GrII DD.   Non-obstructive CAD    a. 07/2014 Abnl MV;  b. 08/2014 Cath: LM nl, LAD 30p, RI 40p, LCX nl, OM1 40, RCA dominant 30p, 70d-->Med Rx; c. 03/2021 Cath: LM nl, LAD 20p/m, LCX 30ost/p, OM1 40, OM2 60, RCA 30p, 50d. RHC w/ minimally elevated filling pressures.   Poor balance    Presence of permanent cardiac pacemaker    Sleep apnea    a. cpap   Vertigo    WPW (Wolff-Parkinson-White syndrome)    a. S/P RFCA 1991.     Patient Active Problem List   Diagnosis Date Noted   Chronic diarrhea 12/13/2021   Polypharmacy 12/13/2021   Decreased mobility 12/13/2021   Malnutrition ([place]) 12/04/2021   NSTEMI (non-ST elevated myocardial infarction) ([place]) 11/05/2021   CKD stage 3 due to type 2 diabetes mellitus ([place]) 11/05/2021   Abnormal CXR    Pulmonary infiltrate in right lung on CXR    Hypoxia    Acute on chronic systolic CHF (congestive heart failure) ([place]) 10/24/2021   Acute on chronic respiratory failure (HCC)    Normocytic anemia 09/18/2021   Other fatigue 66/29/4765   Chronic systolic congestive heart failure ([place]) 07/18/2021  Goals of care, counseling/discussion 04/16/2021   Mediastinal lymphadenopathy 04/16/2021   Heart failure with reduced ejection fraction (HCC)    AAA (abdominal aortic aneurysm) without rupture 12/10/2019   Renal artery stenosis ([place]) 12/10/2019   PAD (peripheral artery disease) ([place]) 12/10/2019   Thromboembolism ([place]) 08/30/2019   Atherosclerosis of native arteries of extremity with intermittent claudication ([place]) 08/23/2019   Swelling of limb 08/17/2019   Pain in limb 08/17/2019   NICM (nonischemic cardiomyopathy) ([place]) 07/01/2019   CHF (congestive heart failure) ([place]) 07/01/2019   Malignant neoplasm of right lung ([place]) 04/18/2016   CAD (coronary artery disease) 04/01/2016   Adenocarcinoma ([place]) 04/01/2016   Skin cyst 12/26/2015   GERD  (gastroesophageal reflux disease) 06/16/2015   Abscess of back 06/06/2015   Vertigo 03/27/2015   Carpal tunnel syndrome 10/28/2014   Status post cholecystectomy 09/29/2014   Disease of digestive tract 09/29/2014   Cardiomyopathy ([place]) 09/26/2014   Type 2 diabetes mellitus without complications ([place])    Sleep apnea    Spinal stenosis, lumbar region, with neurogenic claudication 06/07/2014   Lumbar stenosis with neurogenic claudication 06/07/2014   Abnormal gait 08/20/2012   H/O total knee replacement 08/20/2012   Arthritis of knee, degenerative 08/20/2012   Pacemaker-St.Jude 08/03/2012   Cardiac conduction disorder 06/19/2012   Acid reflux 06/18/2012   Nodal rhythm disorder 06/18/2012   Triggering of digit 03/25/2012   Hyperlipidemia 12/23/2011   Essential hypertension 03/23/2011   Atrioventricular block, complete ([place]) 03/23/2011   Complete atrioventricular block ([place]) 03/23/2011     Past Surgical History:  Procedure Laterality Date   ABDOMINAL AORTIC ENDOVASCULAR STENT GRAFT  08/25/2019   Procedure: ABDOMINAL AORTIC ENDOVASCULAR STENT GRAFT;  Surgeon: Algernon Huxley, MD;  Location: [facility] ORS;  Service: Vascular;;   ANGIOPLASTY Left 08/25/2019   Procedure: ANGIOPLASTY;  Surgeon: Algernon Huxley, MD;  Location: [facility] ORS;  Service: Vascular;  Laterality: Left;  left SFA and stent placement   APPLICATION OF WOUND VAC Left 06/07/2015   Procedure: APPLICATION OF WOUND VAC;  Surgeon: Robert Bellow, MD;  Location: [facility] ORS;  Service: General;  Laterality: Left;  left upper back   [place]  08/26/2014   Single vessel obstructive CAD   CARPAL TUNNEL RELEASE  04-04-15   [facility]   CATARACT EXTRACTION  07-31-11 and 09-18-11   Catheter ablation  1991   for WPW   cervical fusion     CHOLECYSTECTOMY  09-07-14   ENDARTERECTOMY FEMORAL Left 08/25/2019   Procedure: ENDARTERECTOMY FEMORAL;  Surgeon: Algernon Huxley, MD;  Location: [facility] ORS;  Service: Vascular;  Laterality:  Left;  common and produndis    ENDOVASCULAR REPAIR/STENT GRAFT Right 08/25/2019   Procedure: ENDOVASCULAR REPAIR/STENT GRAFT;  Surgeon: Algernon Huxley, MD;  Location: [facility] ORS;  Service: Vascular;  Laterality: Right;  renal artery   HAND SURGERY     right 1993; left 2005   [place] / REPLACE / REMOVE PACEMAKER     INSERTION OF ILIAC STENT Bilateral 08/25/2019   Procedure: INSERTION OF ILIAC STENT;  Surgeon: Algernon Huxley, MD;  Location: [facility] ORS;  Service: Vascular;  Laterality: Bilateral;   JOINT REPLACEMENT Left 2013   knee   JOINT REPLACEMENT Right 2004   knee   KNEE SURGERY     left knee 1991 and 1992; right knee [place] N/A 08/26/2014   Procedure: LEFT HEART CATHETERIZATION  WITH CORONARY ANGIOGRAM;  Surgeon: [name], MD;  Location: [facility] CATH LAB;  Service: Cardiovascular;  Laterality: N/A;   LOWER EXTREMITY ANGIOGRAPHY Left 08/23/2019   Procedure: Lower Extremity Angiography;  Surgeon: Algernon Huxley, MD;  Location: [place] CV LAB;  Service: Cardiovascular;  Laterality: Left;   LUMBAR LAMINECTOMY/DECOMPRESSION MICRODISCECTOMY N/A 06/07/2014   Procedure: LUMBAR FOUR TO FIVE LUMBAR LAMINECTOMY/DECOMPRESSION MICRODISCECTOMY 1 LEVEL;  Surgeon: [name], MD;  Location: [place] NEURO ORS;  Service: Neurosurgery;  Laterality: N/A;   LUNG BIOPSY Right 2016   Dr [name]   MOHS SURGERY     PACEMAKER INSERTION     PPM-- St Jude 11/30/10 by [name]   PPM GENERATOR CHANGEOUT N/A 07/09/2019   Procedure: PPM GENERATOR CHANGEOUT;  Surgeon: Deboraha Sprang, MD;  Location: [place] CV LAB;  Service: Cardiovascular;  Laterality: N/A;   PROSTATE SURGERY     cancer--1998, prostatectomy   REPLACEMENT TOTAL KNEE     2004   RIGHT/LEFT HEART CATH AND CORONARY ANGIOGRAPHY Bilateral 04/13/2021   Procedure: RIGHT/LEFT HEART CATH AND CORONARY ANGIOGRAPHY;  Surgeon: Wellington Hampshire, MD;  Location: [place] CV LAB;  Service: Cardiovascular;   Laterality: Bilateral;   ruptured disc     1962 and 1998   TEE WITHOUT CARDIOVERSION N/A 09/01/2019   Procedure: TRANSESOPHAGEAL ECHOCARDIOGRAM (TEE);  Surgeon: [name], MD;  Location: [facility] ORS;  Service: Cardiovascular;  Laterality: N/A;   TEMPORARY PACEMAKER N/A 07/09/2019   Procedure: TEMPORARY PACEMAKER;  Surgeon: Deboraha Sprang, MD;  Location: [place] CV LAB;  Service: Cardiovascular;  Laterality: N/A;   TRIGGER FINGER RELEASE  01-24-15   WOUND DEBRIDEMENT Left 06/07/2015   Procedure: DEBRIDEMENT WOUND;  Surgeon: Robert Bellow, MD;  Location: [facility] ORS;  Service: General;  Laterality: Left;  left upper back     Prior to Admission medications   Medication Sig Start Date End Date Taking? Authorizing Provider  nirmatrelvir & ritonavir (PAXLOVID, 300/100,) 20 x 150 MG & 10 x 100MG  TBPK Take 3 tablets by mouth in the morning and at bedtime for 5 days. Follow package directions 12/15/21 12/04/2021 Yes Carrie Mew, MD  ondansetron (ZOFRAN-ODT) 4 MG disintegrating tablet Take 1 tablet (4 mg total) by mouth every 8 (eight) hours as needed for nausea or vomiting. 12/15/21  Yes Carrie Mew, MD  acetaminophen (TYLENOL) 500 MG tablet Take 1,000 mg by mouth every 6 (six) hours as needed for moderate pain.    [provider]  albuterol (VENTOLIN HFA) 108 (90 Base) MCG/ACT inhaler INHALE 2 PUFFS INTO LUNGS EVERY 6 HOURS AS NEEDED FOR WHEEZING OR SHORTNESS OF BREATH 07/23/21   Tyler Pita, MD  apixaban (ELIQUIS) 5 MG TABS tablet Take 5 mg by mouth 2 (two) times daily.    [provider]  aspirin EC 81 MG tablet Take 81 mg by mouth daily.    [provider]  cetirizine (ZYRTEC) 10 MG tablet Take 10 mg by mouth daily.    [provider]  Cholecalciferol (VITAMIN D3) 1000 UNITS CAPS Take 1,000 Units by mouth daily.    [provider]  diphenoxylate-atropine (LOMOTIL) 2.5-0.025 MG tablet Take 1 to 2 tablet by mouth daily as needed for  diarrhea 11/22/21   Lucilla Lame, MD  Fluticasone-Umeclidin-Vilant (TRELEGY ELLIPTA) 100-62.5-25 MCG/INH AEPB Inhale 1 puff into the lungs daily. 02/14/21   Tyler Pita, MD  furosemide (LASIX) 40 MG tablet Take 1 tablet (40 mg total) by mouth daily. 12/09/21  Fritzi Mandes, MD  glipiZIDE (GLUCOTROL XL) 5 MG 24 hr tablet Take 1 tablet (5 mg total) by mouth daily with breakfast. 12/03/21   Birdie Sons, MD  Iron-Vitamin C (VITRON-C) 65-125 MG TABS Take 1 tablet by mouth daily. 04/16/21   Earlie Server, MD  isosorbide mononitrate (IMDUR) 60 MG 24 hr tablet Take 1 tablet (60 mg total) by mouth daily. 12/11/21   [name], MD  megestrol (MEGACE) 40 MG tablet Take 1 tablet (40 mg total) by mouth 2 (two) times daily. 09/18/21   Earlie Server, MD  metoprolol succinate (TOPROL-XL) 25 MG 24 hr tablet Take 1 tablet (25 mg total) by mouth at bedtime. 12/11/21   [name], MD  montelukast (SINGULAIR) 10 MG tablet Take 10 mg by mouth daily as needed (sneezing/allergies.).    [provider]  Multiple Vitamin (MULTIVITAMIN WITH MINERALS) TABS tablet Take 1 tablet by mouth daily. One-A-Day Multivitamin    [provider]  nitroGLYCERIN (NITROSTAT) 0.4 MG SL tablet PLACE 1 TABLET UNDER TONGUE EVERY 5 MIN AS NEEDED FOR CHEST PAIN IF NO RELIEF IN15 MIN CALL 911 (MAX 2 TABS) 11/19/21   Gollan, Kathlene November, MD  omeprazole (PRILOSEC) 40 MG capsule TAKE 1 CAPSULE TWICE DAILY AS NEEDED Patient taking differently: Take 40 mg by mouth daily. 02/12/21   Chrismon, [name], PA-C  potassium chloride (KLOR-CON) 10 MEQ tablet Take 2 tablets (20 mEq total) by mouth daily. 12/11/21 03/11/22  [name], MD  rifaximin (XIFAXAN) 550 MG TABS tablet Take 1 tablet (550 mg total) by mouth 3 (three) times daily. 12/03/21   Birdie Sons, MD  sacubitril-valsartan (ENTRESTO) 49-51 MG Take 1 tablet by mouth 2 (two) times daily. 12/08/21   Fritzi Mandes, MD  sertraline (ZOLOFT) 50 MG tablet Take 1 tablet (50  mg total) by mouth daily. 10/23/21   Jerrol Banana., MD  simvastatin (ZOCOR) 40 MG tablet Take 1 tablet (40 mg total) by mouth at bedtime. 12/11/21   [name], MD  spironolactone (ALDACTONE) 25 MG tablet Take 0.5 tablets (12.5 mg total) by mouth daily. 12/11/21 12/06/22  [name], MD     Allergies Sulfa antibiotics   Family History  Problem Relation Age of Onset   Heart attack Mother    Hyperlipidemia Mother    CAD Other    Prostate cancer Neg Hx    Cancer Neg Hx     Social History Social History   Tobacco Use   Smoking status: Former    Packs/day: 1.00    Years: 56.00    Pack years: 56.00    Types: Cigarettes    Quit date: 2011    Years since quitting: 11.9   Smokeless tobacco: Never  Vaping Use   Vaping Use: Never used  Substance Use Topics   Alcohol use: No   Drug use: No    Review of Systems Level 5 Caveat: Portions of the History and Physical including HPI and review of systems are unable to be completely obtained due to patient being a poor historian   Constitutional:   Positive fever.  ENT:   No rhinorrhea. Cardiovascular:   No chest pain or syncope. Respiratory:   No dyspnea or cough. Gastrointestinal:   Negative for abdominal pain or vomiting.  Positive for decreased appetite and decreased oral intake.  Positive for chronic diarrhea which is unchanged recently. Musculoskeletal:   Negative for focal pain or swelling ____________________________________________   PHYSICAL EXAM:  VITAL  SIGNS: ED Triage Vitals  Enc Vitals Group     BP 12/14/21 2212 (!) 121/59     Pulse Rate 12/14/21 2212 94     Resp 12/14/21 2212 18     Temp 12/14/21 2212 99.4 F (37.4 C)     Temp Source 12/14/21 2212 Oral     SpO2 12/14/21 2212 92 %     Weight 12/14/21 2215 174 lb (78.9 kg)     Height 12/14/21 2215 5\' 9"  (1.753 m)     Head Circumference --      Peak Flow --      Pain Score 12/14/21 2215 0     Pain Loc --      Pain Edu? --      Excl.  in [place]? --     Vital signs reviewed, nursing assessments reviewed.   Constitutional:   Alert and oriented to person and place. Non-toxic appearance. Eyes:   Conjunctivae are normal. EOMI. PERRL. ENT      Head:   Normocephalic and atraumatic.      Nose:   No congestion/rhinnorhea.       Mouth/Throat:   MMM, no pharyngeal erythema. No peritonsillar mass.       Neck:   No meningismus. Full ROM. Hematological/Lymphatic/Immunilogical:   No cervical lymphadenopathy. Cardiovascular:   Tachycardia heart rate 100. Symmetric bilateral radial and DP pulses.  No murmurs. Cap refill less than 2 seconds. Respiratory:   Normal respiratory effort without tachypnea/retractions. Breath sounds are clear and equal bilaterally. No wheezes/rales/rhonchi. Gastrointestinal:   Soft and nontender. Non distended. There is no CVA tenderness.  No rebound, rigidity, or guarding. Genitourinary:   deferred Musculoskeletal:   Normal range of motion in all extremities. No joint effusions.  No lower extremity tenderness.  No edema. Neurologic:   Normal speech and language.  Motor grossly intact. No acute focal neurologic deficits are appreciated.  Skin:    Skin is warm, dry and intact. No rash noted.  No petechiae, purpura, or bullae.  ____________________________________________    LABS (pertinent positives/negatives) (all labs ordered are listed, but only abnormal results are displayed) Labs Reviewed  RESP PANEL BY RT-PCR (FLU A&B, COVID) ARPGX2 - Abnormal; Notable for the following components:      Result Value   SARS Coronavirus 2 by RT PCR POSITIVE (*)    All other components within normal limits  CBC WITH DIFFERENTIAL/PLATELET - Abnormal; Notable for the following components:   RBC 3.55 (*)    Hemoglobin 10.1 (*)    HCT 30.7 (*)    RDW 15.9 (*)    All other components within normal limits  COMPREHENSIVE METABOLIC PANEL - Abnormal; Notable for the following components:   Glucose, Bld 124 (*)     Creatinine, Ser 1.31 (*)    GFR, Estimated 52 (*)    All other components within normal limits  URINALYSIS, ROUTINE W REFLEX MICROSCOPIC   ____________________________________________   EKG  Interpreted by me Ventricular paced rhythm, rate of 93.  Left axis, right bundle branch block.  No acute ischemic changes.  ____________________________________________    [id number]  DG Chest Portable 1 View  Result Date: 12/15/2021 CLINICAL DATA:  Cough and shortness of breath with COVID-19 positivity EXAM: PORTABLE CHEST 1 VIEW COMPARISON:  12/04/2021 FINDINGS: Pacing device is again noted. Cardiac shadow is stable. Aortic calcifications are seen. Lungs are well aerated bilaterally. No focal infiltrate or sizable effusion is seen. Previously seen airspace opacities have resolved in the interval. No bony abnormality  is seen. IMPRESSION: No active disease. Electronically Signed   By: Inez Catalina M.D.   On: 12/15/2021 01:47    ____________________________________________   PROCEDURES Procedures  ____________________________________________  DIFFERENTIAL DIAGNOSIS   Pneumonia, dehydration, electrolyte abnormality, COVID  CLINICAL IMPRESSION / ASSESSMENT AND PLAN / ED COURSE  Medications ordered in the ED: Medications  ondansetron (ZOFRAN-ODT) disintegrating tablet 8 mg (0 mg Oral Hold 12/15/21 0206)  acetaminophen (TYLENOL) tablet 1,000 mg (1,000 mg Oral Given 12/15/21 0204)    Pertinent labs & imaging results that were available during my care of the patient were reviewed by me and considered in my medical decision making (see chart for details).   Howard [name] was evaluated in Emergency Department on 12/15/2021 for the symptoms described in the history of present illness. He was evaluated in the context of the global COVID-19 pandemic, which necessitated consideration that the patient might be at risk for infection with the SARS-CoV-2 virus that causes COVID-19. Institutional  protocols and algorithms that pertain to the evaluation of patients at risk for COVID-19 are in a state of rapid change based on information released by regulatory bodies including the CDC and federal and state organizations. These policies and algorithms were followed during the patient's care in the ED.   Patient presents with shortness of breath and fatigue with recent COVID exposure.  He is COVID-positive.  Vital signs are unremarkable except for slightly elevated heart rate which I think is due to some mild dehydration with patient's poor oral intake over the last 2 days of illness.  Serum labs are unremarkable.  Chest x-ray unremarkable.   Considering the patient's symptoms, medical history, and physical examination today, I have low suspicion for ACS, PE, TAD, pneumothorax, carditis, mediastinitis, pneumonia, CHF, or sepsis. After Zofran patient is tolerating oral intake just fine in the ED.  I will prescribe Zofran and Paxlovid.  We will have him hold simvastatin and halve apixaban during Paxlovid course.  Return precautions discussed.      ____________________________________________   FINAL CLINICAL IMPRESSION(S) / ED DIAGNOSES    Final diagnoses:  [id number] virus infection     ED Discharge Orders          Ordered    nirmatrelvir & ritonavir (PAXLOVID, 300/100,) 20 x 150 MG & 10 x 100MG  TBPK  2 times daily        12/15/21 0306    ondansetron (ZOFRAN-ODT) 4 MG disintegrating tablet  Every 8 hours PRN        12/15/21 0306            Portions of this note were generated with dragon dictation software. Dictation errors may occur despite best attempts at proofreading.   Carrie Mew, MD 12/15/21 [phone]

## 2021-12-15 NOTE — Discharge Instructions (Addendum)
You can take Paxlovid to help control the severity of your Covid illness.  While taking the Paxlovid, you should make the following adjustments to your usual medications: Stop taking Simvastatin for the next 5 days Reduce Apixaban (Eliquis) to 2.5mg  twice a day for the next 5 days  Return to the emergency department right away if you have worsening symptoms such as shortness of breath or other concerns.

## 2021-12-18 ENCOUNTER — Telehealth: Payer: Self-pay

## 2021-12-18 ENCOUNTER — Ambulatory Visit: Payer: Self-pay

## 2021-12-18 DIAGNOSIS — I420 Dilated cardiomyopathy: Secondary | ICD-10-CM

## 2021-12-18 DIAGNOSIS — I5022 Chronic systolic (congestive) heart failure: Secondary | ICD-10-CM

## 2021-12-18 DIAGNOSIS — I5020 Unspecified systolic (congestive) heart failure: Secondary | ICD-10-CM

## 2021-12-18 DIAGNOSIS — M48062 Spinal stenosis, lumbar region with neurogenic claudication: Secondary | ICD-10-CM

## 2021-12-18 DIAGNOSIS — R2689 Other abnormalities of gait and mobility: Secondary | ICD-10-CM

## 2021-12-18 NOTE — Telephone Encounter (Signed)
°  Chief Complaint: harsh congested cough, sore throat, covid positive Symptoms: worsened cough, sore throat Frequency: frequent Pertinent Negatives: Patient denies chest pain Disposition: [] ED /[] Urgent Care (no appt availability in office) / [] Appointment(In office/virtual)/ []  [place] [facility]/ [] Home Care/ [] Refused Recommended Disposition  Additional Notes: pt needs appt in 24 hours- unable to find available appt

## 2021-12-18 NOTE — Telephone Encounter (Signed)
Patient advised as below.  

## 2021-12-18 NOTE — Telephone Encounter (Signed)
I don't think that any directly admit from home.  Are they working with home, health, if so they can request home health social worker to assist with that

## 2021-12-18 NOTE — Telephone Encounter (Signed)
Copied from [place] [id number]. Topic: Quick Communication - Home Health Verbal Orders >> Dec 18, 2021  1:38 PM McGill, Nelva Bush wrote: Caller/Agency: [facility] Number: [phone] Requesting OT/PT/Skilled Nursing/Social Work/Speech Therapy: PT Frequency: twice a week for one week/one time a week for one week

## 2021-12-18 NOTE — Telephone Encounter (Signed)
Left detailed message for [facility] approving orders, per Dr. Caryn Section.

## 2021-12-18 NOTE — Telephone Encounter (Signed)
Reason for Disposition  Coughing up rusty-colored (reddish-brown) sputum  Answer Assessment - Initial Assessment Questions 1. ONSET: "When did the cough begin?"      Friday 2. SEVERITY: "How bad is the cough today?"      Harsh congested cough 3. SPUTUM: "Describe the color of your sputum" (none, dry cough; clear, white, yellow, green)     White and reddish 4. HEMOPTYSIS: "Are you coughing up any blood?" If so ask: "How much?" (flecks, streaks, tablespoons, etc.)    Yes- teaspoon (r/T) 5. DIFFICULTY BREATHING: "Are you having difficulty breathing?" If Yes, ask: "How bad is it?" (e.g., mild, moderate, severe)    - MILD: No SOB at rest, mild SOB with walking, speaks normally in sentences, can lie down, no retractions, pulse < 100.    - MODERATE: SOB at rest, SOB with minimal exertion and prefers to sit, cannot lie down flat, speaks in phrases, mild retractions, audible wheezing, pulse 100-120.    - SEVERE: Very SOB at rest, speaks in single words, struggling to breathe, sitting hunched forward, retractions, pulse > 120      mild 6. FEVER: "Do you have a fever?" If Yes, ask: "What is your temperature, how was it measured, and when did it start?"     no 7. CARDIAC HISTORY: "Do you have any history of heart disease?" (e.g., heart attack, congestive heart failure)      CHF, pacemaker 8. LUNG HISTORY: "Do you have any history of lung disease?"  (e.g., pulmonary embolus, asthma, emphysema)     Lung Ca 9. PE RISK FACTORS: "Do you have a history of blood clots?" (or: recent major surgery, recent prolonged travel, bedridden)     no 10. OTHER SYMPTOMS: "Do you have any other symptoms?" (e.g., runny nose, wheezing, chest pain)       Sore throat, frequent cough  12. TRAVEL: "Have you traveled out of the country in the last month?" (e.g., travel history, exposures)       no  Protocols used: Cough - Acute Productive-A-AH

## 2021-12-18 NOTE — Telephone Encounter (Signed)
That's fine

## 2021-12-18 NOTE — Telephone Encounter (Signed)
  My dad is supposed to come into [facility] today for continued severe diarrhea and a post-COVID visit.  Mom feels she is overwhelmed and is looking for options for his care at least temporarily.  Do you know if any nursing homes in the area take patients from home (vs hospital discharge) even via self pay?    [facility] for [facility] to come tomorrow to evaluate for at-home care at $30/hour.  Are there better options.  Thanks for any input.  [name]

## 2021-12-18 NOTE — Telephone Encounter (Signed)
Lmtcb. PEC please advise as below. 

## 2021-12-18 NOTE — Telephone Encounter (Signed)
He was started on paxlovid for covid on 12/15/2021.  May take 5-10 days to feel better. If he is feeling more short of breath then needs to go to ER. Otherwise he can take OTC robitussin DM for cough until things clear up.

## 2021-12-19 ENCOUNTER — Telehealth: Payer: Self-pay | Admitting: Oncology

## 2021-12-19 NOTE — Telephone Encounter (Signed)
Wife called to reschedule pt's appt  She will need a new appt for CT scan also. Pt has Covid. Needs appt at the beginning of the year. Call back at [phone]

## 2021-12-19 NOTE — Telephone Encounter (Signed)
Patient's son emailed back and requested to be referred to CCM if possible to help facilitate move.

## 2021-12-19 NOTE — Telephone Encounter (Signed)
Dr. Caryn Section, does an order need to be placed for a CCM referral?

## 2021-12-20 ENCOUNTER — Telehealth: Payer: Self-pay | Admitting: *Deleted

## 2021-12-20 ENCOUNTER — Ambulatory Visit: Payer: Medicare Other

## 2021-12-20 ENCOUNTER — Emergency Department: Payer: Medicare Other

## 2021-12-20 ENCOUNTER — Ambulatory Visit
Admission: EM | Admit: 2021-12-20 | Discharge: 2021-12-20 | Disposition: A | Discharge status: Home / Self Care | Payer: Medicare Other

## 2021-12-20 ENCOUNTER — Other Ambulatory Visit: Payer: Self-pay

## 2021-12-20 ENCOUNTER — Inpatient Hospital Stay
Admission: EM | Admit: 2021-12-20 | Discharge: 2022-01-23 | DRG: 177 | Disposition: E | Payer: Medicare Other | Attending: Internal Medicine | Admitting: Internal Medicine

## 2021-12-20 ENCOUNTER — Ambulatory Visit: Payer: Self-pay

## 2021-12-20 DIAGNOSIS — R042 Hemoptysis: Secondary | ICD-10-CM | POA: Diagnosis present

## 2021-12-20 DIAGNOSIS — J189 Pneumonia, unspecified organism: Secondary | ICD-10-CM

## 2021-12-20 DIAGNOSIS — R0602 Shortness of breath: Secondary | ICD-10-CM | POA: Diagnosis not present

## 2021-12-20 DIAGNOSIS — F32A Depression, unspecified: Secondary | ICD-10-CM | POA: Diagnosis present

## 2021-12-20 DIAGNOSIS — I428 Other cardiomyopathies: Secondary | ICD-10-CM | POA: Diagnosis present

## 2021-12-20 DIAGNOSIS — N1831 Chronic kidney disease, stage 3a: Secondary | ICD-10-CM | POA: Diagnosis not present

## 2021-12-20 DIAGNOSIS — K219 Gastro-esophageal reflux disease without esophagitis: Secondary | ICD-10-CM | POA: Diagnosis present

## 2021-12-20 DIAGNOSIS — Z8249 Family history of ischemic heart disease and other diseases of the circulatory system: Secondary | ICD-10-CM

## 2021-12-20 DIAGNOSIS — Z87891 Personal history of nicotine dependence: Secondary | ICD-10-CM

## 2021-12-20 DIAGNOSIS — J9601 Acute respiratory failure with hypoxia: Secondary | ICD-10-CM | POA: Diagnosis not present

## 2021-12-20 DIAGNOSIS — J9621 Acute and chronic respiratory failure with hypoxia: Secondary | ICD-10-CM | POA: Diagnosis present

## 2021-12-20 DIAGNOSIS — L8990 Pressure ulcer of unspecified site, unspecified stage: Secondary | ICD-10-CM | POA: Insufficient documentation

## 2021-12-20 DIAGNOSIS — D631 Anemia in chronic kidney disease: Secondary | ICD-10-CM | POA: Diagnosis present

## 2021-12-20 DIAGNOSIS — N183 Chronic kidney disease, stage 3 unspecified: Secondary | ICD-10-CM | POA: Diagnosis present

## 2021-12-20 DIAGNOSIS — J1282 Pneumonia due to coronavirus disease 2019: Secondary | ICD-10-CM | POA: Diagnosis present

## 2021-12-20 DIAGNOSIS — I509 Heart failure, unspecified: Secondary | ICD-10-CM | POA: Diagnosis not present

## 2021-12-20 DIAGNOSIS — C3490 Malignant neoplasm of unspecified part of unspecified bronchus or lung: Secondary | ICD-10-CM | POA: Diagnosis present

## 2021-12-20 DIAGNOSIS — E1169 Type 2 diabetes mellitus with other specified complication: Secondary | ICD-10-CM | POA: Diagnosis not present

## 2021-12-20 DIAGNOSIS — I5042 Chronic combined systolic (congestive) and diastolic (congestive) heart failure: Secondary | ICD-10-CM | POA: Diagnosis present

## 2021-12-20 DIAGNOSIS — Z7984 Long term (current) use of oral hypoglycemic drugs: Secondary | ICD-10-CM | POA: Diagnosis not present

## 2021-12-20 DIAGNOSIS — R918 Other nonspecific abnormal finding of lung field: Secondary | ICD-10-CM | POA: Diagnosis not present

## 2021-12-20 DIAGNOSIS — Z83438 Family history of other disorder of lipoprotein metabolism and other lipidemia: Secondary | ICD-10-CM

## 2021-12-20 DIAGNOSIS — Z6825 Body mass index (BMI) 25.0-25.9, adult: Secondary | ICD-10-CM

## 2021-12-20 DIAGNOSIS — I130 Hypertensive heart and chronic kidney disease with heart failure and stage 1 through stage 4 chronic kidney disease, or unspecified chronic kidney disease: Secondary | ICD-10-CM | POA: Diagnosis present

## 2021-12-20 DIAGNOSIS — Z8546 Personal history of malignant neoplasm of prostate: Secondary | ICD-10-CM

## 2021-12-20 DIAGNOSIS — Z950 Presence of cardiac pacemaker: Secondary | ICD-10-CM | POA: Diagnosis not present

## 2021-12-20 DIAGNOSIS — E119 Type 2 diabetes mellitus without complications: Secondary | ICD-10-CM

## 2021-12-20 DIAGNOSIS — E872 Acidosis, unspecified: Secondary | ICD-10-CM | POA: Diagnosis present

## 2021-12-20 DIAGNOSIS — Z882 Allergy status to sulfonamides status: Secondary | ICD-10-CM

## 2021-12-20 DIAGNOSIS — C801 Malignant (primary) neoplasm, unspecified: Secondary | ICD-10-CM | POA: Diagnosis present

## 2021-12-20 DIAGNOSIS — E785 Hyperlipidemia, unspecified: Secondary | ICD-10-CM | POA: Diagnosis present

## 2021-12-20 DIAGNOSIS — U071 COVID-19: Principal | ICD-10-CM

## 2021-12-20 DIAGNOSIS — E1122 Type 2 diabetes mellitus with diabetic chronic kidney disease: Secondary | ICD-10-CM | POA: Diagnosis present

## 2021-12-20 DIAGNOSIS — J440 Chronic obstructive pulmonary disease with acute lower respiratory infection: Secondary | ICD-10-CM | POA: Diagnosis present

## 2021-12-20 DIAGNOSIS — K529 Noninfective gastroenteritis and colitis, unspecified: Secondary | ICD-10-CM | POA: Diagnosis present

## 2021-12-20 DIAGNOSIS — R0600 Dyspnea, unspecified: Secondary | ICD-10-CM | POA: Diagnosis not present

## 2021-12-20 DIAGNOSIS — J9811 Atelectasis: Secondary | ICD-10-CM | POA: Diagnosis not present

## 2021-12-20 DIAGNOSIS — I119 Hypertensive heart disease without heart failure: Secondary | ICD-10-CM | POA: Diagnosis not present

## 2021-12-20 DIAGNOSIS — J90 Pleural effusion, not elsewhere classified: Secondary | ICD-10-CM | POA: Diagnosis not present

## 2021-12-20 DIAGNOSIS — I447 Left bundle-branch block, unspecified: Secondary | ICD-10-CM | POA: Diagnosis present

## 2021-12-20 DIAGNOSIS — G4730 Sleep apnea, unspecified: Secondary | ICD-10-CM | POA: Diagnosis present

## 2021-12-20 DIAGNOSIS — Z923 Personal history of irradiation: Secondary | ICD-10-CM | POA: Diagnosis not present

## 2021-12-20 DIAGNOSIS — I714 Abdominal aortic aneurysm, without rupture, unspecified: Secondary | ICD-10-CM | POA: Diagnosis present

## 2021-12-20 DIAGNOSIS — L89151 Pressure ulcer of sacral region, stage 1: Secondary | ICD-10-CM | POA: Diagnosis present

## 2021-12-20 DIAGNOSIS — R1310 Dysphagia, unspecified: Secondary | ICD-10-CM | POA: Diagnosis present

## 2021-12-20 DIAGNOSIS — R627 Adult failure to thrive: Secondary | ICD-10-CM | POA: Diagnosis not present

## 2021-12-20 DIAGNOSIS — R911 Solitary pulmonary nodule: Secondary | ICD-10-CM | POA: Diagnosis not present

## 2021-12-20 DIAGNOSIS — F0394 Unspecified dementia, unspecified severity, with anxiety: Secondary | ICD-10-CM | POA: Diagnosis present

## 2021-12-20 DIAGNOSIS — Z96653 Presence of artificial knee joint, bilateral: Secondary | ICD-10-CM | POA: Diagnosis present

## 2021-12-20 DIAGNOSIS — I456 Pre-excitation syndrome: Secondary | ICD-10-CM | POA: Diagnosis present

## 2021-12-20 DIAGNOSIS — Z981 Arthrodesis status: Secondary | ICD-10-CM

## 2021-12-20 DIAGNOSIS — Z85828 Personal history of other malignant neoplasm of skin: Secondary | ICD-10-CM

## 2021-12-20 DIAGNOSIS — Z7951 Long term (current) use of inhaled steroids: Secondary | ICD-10-CM

## 2021-12-20 DIAGNOSIS — Z7901 Long term (current) use of anticoagulants: Secondary | ICD-10-CM

## 2021-12-20 DIAGNOSIS — Z515 Encounter for palliative care: Secondary | ICD-10-CM | POA: Diagnosis not present

## 2021-12-20 DIAGNOSIS — E1159 Type 2 diabetes mellitus with other circulatory complications: Secondary | ICD-10-CM | POA: Diagnosis not present

## 2021-12-20 DIAGNOSIS — Z79899 Other long term (current) drug therapy: Secondary | ICD-10-CM

## 2021-12-20 DIAGNOSIS — I700 Atherosclerosis of aorta: Secondary | ICD-10-CM | POA: Diagnosis not present

## 2021-12-20 DIAGNOSIS — R221 Localized swelling, mass and lump, neck: Secondary | ICD-10-CM | POA: Diagnosis not present

## 2021-12-20 DIAGNOSIS — I252 Old myocardial infarction: Secondary | ICD-10-CM

## 2021-12-20 DIAGNOSIS — J029 Acute pharyngitis, unspecified: Secondary | ICD-10-CM | POA: Diagnosis not present

## 2021-12-20 DIAGNOSIS — E1151 Type 2 diabetes mellitus with diabetic peripheral angiopathy without gangrene: Secondary | ICD-10-CM | POA: Diagnosis present

## 2021-12-20 DIAGNOSIS — R070 Pain in throat: Secondary | ICD-10-CM | POA: Diagnosis not present

## 2021-12-20 DIAGNOSIS — I442 Atrioventricular block, complete: Secondary | ICD-10-CM | POA: Diagnosis present

## 2021-12-20 DIAGNOSIS — I2510 Atherosclerotic heart disease of native coronary artery without angina pectoris: Secondary | ICD-10-CM | POA: Diagnosis present

## 2021-12-20 DIAGNOSIS — Z66 Do not resuscitate: Secondary | ICD-10-CM | POA: Diagnosis present

## 2021-12-20 DIAGNOSIS — E1165 Type 2 diabetes mellitus with hyperglycemia: Secondary | ICD-10-CM | POA: Diagnosis present

## 2021-12-20 LAB — CBC WITH DIFFERENTIAL/PLATELET
Abs Immature Granulocytes: 0.02 10*3/uL (ref 0.00–0.07)
Basophils Absolute: 0 10*3/uL (ref 0.0–0.1)
Basophils Relative: 0 %
Eosinophils Absolute: 0 10*3/uL (ref 0.0–0.5)
Eosinophils Relative: 1 %
HCT: 34 % — ABNORMAL LOW (ref 39.0–52.0)
Hemoglobin: 10.5 g/dL — ABNORMAL LOW (ref 13.0–17.0)
Immature Granulocytes: 0 %
Lymphocytes Relative: 22 %
Lymphs Abs: 1.2 10*3/uL (ref 0.7–4.0)
MCH: 27.3 pg (ref 26.0–34.0)
MCHC: 30.9 g/dL (ref 30.0–36.0)
MCV: 88.3 fL (ref 80.0–100.0)
Monocytes Absolute: 0.6 10*3/uL (ref 0.1–1.0)
Monocytes Relative: 11 %
Neutro Abs: 3.6 10*3/uL (ref 1.7–7.7)
Neutrophils Relative %: 66 %
Platelets: 284 10*3/uL (ref 150–400)
RBC: 3.85 MIL/uL — ABNORMAL LOW (ref 4.22–5.81)
RDW: 15.9 % — ABNORMAL HIGH (ref 11.5–15.5)
WBC: 5.5 10*3/uL (ref 4.0–10.5)
nRBC: 0 % (ref 0.0–0.2)

## 2021-12-20 LAB — COMPREHENSIVE METABOLIC PANEL
ALT: 14 U/L (ref 0–44)
AST: 26 U/L (ref 15–41)
Albumin: 3.4 g/dL — ABNORMAL LOW (ref 3.5–5.0)
Alkaline Phosphatase: 58 U/L (ref 38–126)
Anion gap: 11 (ref 5–15)
BUN: 22 mg/dL (ref 8–23)
CO2: 15 mmol/L — ABNORMAL LOW (ref 22–32)
Calcium: 9.2 mg/dL (ref 8.9–10.3)
Chloride: 112 mmol/L — ABNORMAL HIGH (ref 98–111)
Creatinine, Ser: 1.11 mg/dL (ref 0.61–1.24)
GFR, Estimated: >60 mL/min (ref >60)
Glucose, Bld: 138 mg/dL — ABNORMAL HIGH (ref 70–99)
Potassium: 3.8 mmol/L (ref 3.5–5.1)
Sodium: 138 mmol/L (ref 135–145)
Total Bilirubin: 0.8 mg/dL (ref 0.3–1.2)
Total Protein: 7.6 g/dL (ref 6.5–8.1)

## 2021-12-20 LAB — RESP PANEL BY RT-PCR (FLU A&B, COVID) ARPGX2
Influenza A by PCR: NEGATIVE
Influenza B by PCR: NEGATIVE
SARS Coronavirus 2 by RT PCR: POSITIVE — AB

## 2021-12-20 LAB — TROPONIN I (HIGH SENSITIVITY): Troponin I (High Sensitivity): 21 ng/L — ABNORMAL HIGH (ref <18)

## 2021-12-20 MED ORDER — ASPIRIN 81 MG PO CHEW
324.0000 mg | CHEWABLE_TABLET | Freq: Once | ORAL | Status: AC
  Administered 2021-12-20: 21:00:00 324 mg via ORAL
  Filled 2021-12-20: qty 4

## 2021-12-20 MED ORDER — IOHEXOL 300 MG/ML  SOLN
75.0000 mL | Freq: Once | INTRAMUSCULAR | Status: AC | PRN
  Administered 2021-12-20: 21:00:00 75 mL via INTRAVENOUS
  Filled 2021-12-20: qty 75

## 2021-12-20 MED ORDER — SODIUM CHLORIDE 0.9 % IV BOLUS
1000.0000 mL | Freq: Once | INTRAVENOUS | Status: AC
  Administered 2021-12-20: 20:00:00 1000 mL via INTRAVENOUS

## 2021-12-20 MED ORDER — NITROGLYCERIN 0.4 MG SL SUBL
0.4000 mg | SUBLINGUAL_TABLET | SUBLINGUAL | Status: DC | PRN
  Administered 2021-12-20 (×3): 0.4 mg via SUBLINGUAL
  Filled 2021-12-20 (×3): qty 1

## 2021-12-20 MED ORDER — SODIUM CHLORIDE 0.9 % IV SOLN
1.0000 g | Freq: Once | INTRAVENOUS | Status: AC
  Administered 2021-12-20: 20:00:00 1 g via INTRAVENOUS
  Filled 2021-12-20: qty 10

## 2021-12-20 MED ORDER — NITROGLYCERIN 0.4 MG SL SUBL: 0.4000 mg | SUBLINGUAL_TABLET | SUBLINGUAL | Status: DC | PRN

## 2021-12-20 MED ORDER — SODIUM CHLORIDE 0.9 % IV SOLN
500.0000 mg | Freq: Once | INTRAVENOUS | Status: AC
  Administered 2021-12-20: 21:00:00 500 mg via INTRAVENOUS
  Filled 2021-12-20: qty 5

## 2021-12-20 NOTE — ED Notes (Addendum)
Pt put on 2L due to WOB, SpO2 now 95%

## 2021-12-20 NOTE — ED Provider Notes (Signed)
[facility] Emergency Department Provider Note    ____________________________________________   Event Date/Time   First MD Initiated Contact with Patient 12/02/2021 1511     (approximate)  I have reviewed the triage vital signs and the nursing notes.   HISTORY  Chief Complaint Sore Throat   HPI Howard [name] is a 87 y.o. male , history of lung cancer, CHF, hypertension, diabetes, WPW, and GERD, presents emergency department for evaluation of sore throat.  Patient states that he was recently diagnosed with COVID 6 days prior.  Since then he has been coughing very frequently and gradually developing a sore throat.  He states that the pain is so bad now that he is unable to swallow pills.  He says the pain is worse on the left side.  Additionally, he reports hemoptysis/bloody mucus, which is more than his usual hemoptysis secondary to lung cancer.  Denies fever/chills, chest pain, shortness of breath, abdominal pain, rashes, or urinary symptoms.  History limited by: No limitations.  Past Medical History:  Diagnosis Date   Arthritis    Bell palsy    Bell's palsy 04/12/2015   Cancer [facility])    prostate and skin   Chronic combined systolic and diastolic CHF, NYHA class 1 ([place])    a. 07/2014 Echo: EF 35-40%, Gr 1 DD; b. 03/2021 Echo: EF 20-25%; c. 10/2021 Echo: EF 20-25%, glob HK, GrII DD, nl RV fxn, mod LAE, mild to mod MR, mild-mod AoV scelrosis, Ao root 61mm.   Complete heart block ([place])    a. 11/2010 s/p SJM 2210 [employer] DC PPM, ser# [id number].   Depression    Diabetes mellitus without complication [facility])    Fall 11/10/2014   GERD (gastroesophageal reflux disease)    History of prostate cancer    Hyperlipidemia    Hypertension    LBBB (left bundle branch block)    Left-sided Bell's palsy    Lung cancer ([place]) 2016   NICM (nonischemic cardiomyopathy) ([place])    a. 07/2014 Echo: EF 35-40%, Gr 1 DD; b. 03/2021 Echo: EF 20-25%; c. 03/2021 Cath:  d. 10/2021 Echo: EF  20-25%, glob HK, GrII DD.   Non-obstructive CAD    a. 07/2014 Abnl MV;  b. 08/2014 Cath: LM nl, LAD 30p, RI 40p, LCX nl, OM1 40, RCA dominant 30p, 70d-->Med Rx; c. 03/2021 Cath: LM nl, LAD 20p/m, LCX 30ost/p, OM1 40, OM2 60, RCA 30p, 50d. RHC w/ minimally elevated filling pressures.   Poor balance    Presence of permanent cardiac pacemaker    Sleep apnea    a. cpap   Vertigo    WPW (Wolff-Parkinson-White syndrome)    a. S/P RFCA 1991.    Patient Active Problem List   Diagnosis Date Noted   Chronic diarrhea 12/13/2021   Polypharmacy 12/13/2021   Decreased mobility 12/13/2021   Malnutrition ([place]) 12/04/2021   NSTEMI (non-ST elevated myocardial infarction) ([place]) 11/05/2021   CKD stage 3 due to type 2 diabetes mellitus ([place]) 11/05/2021   Abnormal CXR    Pulmonary infiltrate in right lung on CXR    Hypoxia    Acute on chronic systolic CHF (congestive heart failure) ([place]) 10/24/2021   Acute on chronic respiratory failure (HCC)    Normocytic anemia 09/18/2021   Other fatigue 23/76/2831   Chronic systolic congestive heart failure ([place]) 07/18/2021   Goals of care, counseling/discussion 04/16/2021   Mediastinal lymphadenopathy 04/16/2021   Heart failure with reduced ejection fraction ([place])    AAA (abdominal aortic aneurysm)  without rupture 12/10/2019   Renal artery stenosis ([place]) 12/10/2019   PAD (peripheral artery disease) ([place]) 12/10/2019   Thromboembolism ([place]) 08/30/2019   Atherosclerosis of native arteries of extremity with intermittent claudication ([place]) 08/23/2019   Swelling of limb 08/17/2019   Pain in limb 08/17/2019   NICM (nonischemic cardiomyopathy) ([place]) 07/01/2019   CHF (congestive heart failure) ([place]) 07/01/2019   Malignant neoplasm of right lung ([place]) 04/18/2016   CAD (coronary artery disease) 04/01/2016   Adenocarcinoma ([place]) 04/01/2016   Skin cyst 12/26/2015   GERD (gastroesophageal reflux disease) 06/16/2015   Abscess of back 06/06/2015   Vertigo 03/27/2015    Carpal tunnel syndrome 10/28/2014   Status post cholecystectomy 09/29/2014   Disease of digestive tract 09/29/2014   Cardiomyopathy ([place]) 09/26/2014   Type 2 diabetes mellitus without complications ([place])    Sleep apnea    Spinal stenosis, lumbar region, with neurogenic claudication 06/07/2014   Lumbar stenosis with neurogenic claudication 06/07/2014   Abnormal gait 08/20/2012   H/O total knee replacement 08/20/2012   Arthritis of knee, degenerative 08/20/2012   Pacemaker-St.Jude 08/03/2012   Cardiac conduction disorder 06/19/2012   Acid reflux 06/18/2012   Nodal rhythm disorder 06/18/2012   Triggering of digit 03/25/2012   Hyperlipidemia 12/23/2011   Essential hypertension 03/23/2011   Atrioventricular block, complete ([place]) 03/23/2011   Complete atrioventricular block ([place]) 03/23/2011    Past Surgical History:  Procedure Laterality Date   ABDOMINAL AORTIC ENDOVASCULAR STENT GRAFT  08/25/2019   Procedure: ABDOMINAL AORTIC ENDOVASCULAR STENT GRAFT;  Surgeon: Algernon Huxley, MD;  Location: [facility] ORS;  Service: Vascular;;   ANGIOPLASTY Left 08/25/2019   Procedure: ANGIOPLASTY;  Surgeon: Algernon Huxley, MD;  Location: [facility] ORS;  Service: Vascular;  Laterality: Left;  left SFA and stent placement   APPLICATION OF WOUND VAC Left 06/07/2015   Procedure: APPLICATION OF WOUND VAC;  Surgeon: Robert Bellow, MD;  Location: [facility] ORS;  Service: General;  Laterality: Left;  left upper back   [place]  08/26/2014   Single vessel obstructive CAD   CARPAL TUNNEL RELEASE  04-04-15   [facility]   CATARACT EXTRACTION  07-31-11 and 09-18-11   Catheter ablation  1991   for WPW   cervical fusion     CHOLECYSTECTOMY  09-07-14   ENDARTERECTOMY FEMORAL Left 08/25/2019   Procedure: ENDARTERECTOMY FEMORAL;  Surgeon: Algernon Huxley, MD;  Location: [facility] ORS;  Service: Vascular;  Laterality: Left;  common and produndis    ENDOVASCULAR REPAIR/STENT GRAFT Right 08/25/2019   Procedure:  ENDOVASCULAR REPAIR/STENT GRAFT;  Surgeon: Algernon Huxley, MD;  Location: [facility] ORS;  Service: Vascular;  Laterality: Right;  renal artery   HAND SURGERY     right 1993; left 2005   [place] / REPLACE / REMOVE PACEMAKER     INSERTION OF ILIAC STENT Bilateral 08/25/2019   Procedure: INSERTION OF ILIAC STENT;  Surgeon: Algernon Huxley, MD;  Location: [facility] ORS;  Service: Vascular;  Laterality: Bilateral;   JOINT REPLACEMENT Left 2013   knee   JOINT REPLACEMENT Right 2004   knee   KNEE SURGERY     left knee 1991 and 1992; right knee [place] N/A 08/26/2014   Procedure: LEFT HEART CATHETERIZATION WITH CORONARY ANGIOGRAM;  Surgeon: [name], MD;  Location: [facility] CATH LAB;  Service: Cardiovascular;  Laterality: N/A;   LOWER EXTREMITY ANGIOGRAPHY Left 08/23/2019  Procedure: Lower Extremity Angiography;  Surgeon: Algernon Huxley, MD;  Location: [place] CV LAB;  Service: Cardiovascular;  Laterality: Left;   LUMBAR LAMINECTOMY/DECOMPRESSION MICRODISCECTOMY N/A 06/07/2014   Procedure: LUMBAR FOUR TO FIVE LUMBAR LAMINECTOMY/DECOMPRESSION MICRODISCECTOMY 1 LEVEL;  Surgeon: [name], MD;  Location: [place] NEURO ORS;  Service: Neurosurgery;  Laterality: N/A;   LUNG BIOPSY Right 2016   Dr [name]   MOHS SURGERY     PACEMAKER INSERTION     PPM-- St Jude 11/30/10 by [name]   PPM GENERATOR CHANGEOUT N/A 07/09/2019   Procedure: PPM GENERATOR CHANGEOUT;  Surgeon: Deboraha Sprang, MD;  Location: [place] CV LAB;  Service: Cardiovascular;  Laterality: N/A;   PROSTATE SURGERY     cancer--1998, prostatectomy   REPLACEMENT TOTAL KNEE     2004   RIGHT/LEFT HEART CATH AND CORONARY ANGIOGRAPHY Bilateral 04/13/2021   Procedure: RIGHT/LEFT HEART CATH AND CORONARY ANGIOGRAPHY;  Surgeon: Wellington Hampshire, MD;  Location: [place] CV LAB;  Service: Cardiovascular;  Laterality: Bilateral;   ruptured disc     1962 and 1998   TEE WITHOUT CARDIOVERSION N/A  09/01/2019   Procedure: TRANSESOPHAGEAL ECHOCARDIOGRAM (TEE);  Surgeon: [name], MD;  Location: [facility] ORS;  Service: Cardiovascular;  Laterality: N/A;   TEMPORARY PACEMAKER N/A 07/09/2019   Procedure: TEMPORARY PACEMAKER;  Surgeon: Deboraha Sprang, MD;  Location: [place] CV LAB;  Service: Cardiovascular;  Laterality: N/A;   TRIGGER FINGER RELEASE  01-24-15   WOUND DEBRIDEMENT Left 06/07/2015   Procedure: DEBRIDEMENT WOUND;  Surgeon: Robert Bellow, MD;  Location: [facility] ORS;  Service: General;  Laterality: Left;  left upper back    Prior to Admission medications   Medication Sig Start Date End Date Taking? Authorizing Provider  acetaminophen (TYLENOL) 500 MG tablet Take 1,000 mg by mouth every 6 (six) hours as needed for moderate pain.   Yes [provider]  albuterol (VENTOLIN HFA) 108 (90 Base) MCG/ACT inhaler INHALE 2 PUFFS INTO LUNGS EVERY 6 HOURS AS NEEDED FOR WHEEZING OR SHORTNESS OF BREATH 07/23/21  Yes Tyler Pita, MD  apixaban (ELIQUIS) 5 MG TABS tablet Take 5 mg by mouth 2 (two) times daily.   Yes [provider]  aspirin EC 81 MG tablet Take 81 mg by mouth daily.   Yes [provider]  cetirizine (ZYRTEC) 10 MG tablet Take 10 mg by mouth daily.   Yes [provider]  Cholecalciferol (VITAMIN D3) 1000 UNITS CAPS Take 1,000 Units by mouth daily.   Yes [provider]  diphenoxylate-atropine (LOMOTIL) 2.5-0.025 MG tablet Take 1 to 2 tablet by mouth daily as needed for diarrhea 11/22/21  Yes Lucilla Lame, MD  Fluticasone-Umeclidin-Vilant (TRELEGY ELLIPTA) 100-62.5-25 MCG/INH AEPB Inhale 1 puff into the lungs daily. 02/14/21  Yes Tyler Pita, MD  furosemide (LASIX) 40 MG tablet Take 1 tablet (40 mg total) by mouth daily. 12/09/21  Yes Fritzi Mandes, MD  glipiZIDE (GLUCOTROL XL) 5 MG 24 hr tablet Take 1 tablet (5 mg total) by mouth daily with breakfast. 12/03/21  Yes Birdie Sons, MD  Iron-Vitamin C (VITRON-C) 65-125 MG TABS  Take 1 tablet by mouth daily. 04/16/21  Yes Earlie Server, MD  isosorbide mononitrate (IMDUR) 60 MG 24 hr tablet Take 1 tablet (60 mg total) by mouth daily. 12/11/21  Yes [name], MD  megestrol (MEGACE) 40 MG tablet Take 1 tablet (40 mg total) by mouth 2 (two) times daily. 09/18/21  Yes Earlie Server, MD  metoprolol succinate (TOPROL-XL) 25 MG 24 hr tablet Take 1 tablet (25 mg total) by mouth at bedtime. 12/11/21  Yes Gollan, Kathlene November, MD  montelukast (SINGULAIR) 10 MG tablet Take 10 mg by mouth daily as needed (sneezing/allergies.).   Yes [provider]  Multiple Vitamin (MULTIVITAMIN WITH MINERALS) TABS tablet Take 1 tablet by mouth daily. One-A-Day Multivitamin   Yes [provider]  nirmatrelvir & ritonavir (PAXLOVID, 300/100,) 20 x 150 MG & 10 x 100MG  TBPK Take 3 tablets by mouth in the morning and at bedtime for 5 days. Follow package directions 12/15/21 12/16/2021 Yes Carrie Mew, MD  omeprazole (PRILOSEC) 40 MG capsule TAKE 1 CAPSULE TWICE DAILY AS NEEDED Patient taking differently: Take 40 mg by mouth daily. 02/12/21  Yes Chrismon, [name], PA-C  ondansetron (ZOFRAN-ODT) 4 MG disintegrating tablet Take 1 tablet (4 mg total) by mouth every 8 (eight) hours as needed for nausea or vomiting. 12/15/21  Yes Carrie Mew, MD  potassium chloride (KLOR-CON) 10 MEQ tablet Take 2 tablets (20 mEq total) by mouth daily. 12/11/21 03/11/22 Yes Gollan, Kathlene November, MD  rifaximin (XIFAXAN) 550 MG TABS tablet Take 1 tablet (550 mg total) by mouth 3 (three) times daily. 12/03/21  Yes Birdie Sons, MD  sacubitril-valsartan (ENTRESTO) 49-51 MG Take 1 tablet by mouth 2 (two) times daily. 12/08/21  Yes Fritzi Mandes, MD  sertraline (ZOLOFT) 50 MG tablet Take 1 tablet (50 mg total) by mouth daily. 10/23/21  Yes Jerrol Banana., MD  simvastatin (ZOCOR) 40 MG tablet Take 1 tablet (40 mg total) by mouth at bedtime. 12/11/21  Yes [name], MD  spironolactone (ALDACTONE) 25 MG  tablet Take 0.5 tablets (12.5 mg total) by mouth daily. 12/11/21 12/06/22 Yes Gollan, Kathlene November, MD  nitroGLYCERIN (NITROSTAT) 0.4 MG SL tablet PLACE 1 TABLET UNDER TONGUE EVERY 5 MIN AS NEEDED FOR CHEST PAIN IF NO RELIEF IN15 MIN CALL 911 (MAX 2 TABS) 11/19/21   Gollan, Kathlene November, MD    Allergies Sulfa antibiotics  Family History  Problem Relation Age of Onset   Heart attack Mother    Hyperlipidemia Mother    CAD Other    Prostate cancer Neg Hx    Cancer Neg Hx     Social History Social History   Tobacco Use   Smoking status: Former    Packs/day: 1.00    Years: 56.00    Pack years: 56.00    Types: Cigarettes    Quit date: 2011    Years since quitting: 12.0   Smokeless tobacco: Never  Vaping Use   Vaping Use: Never used  Substance Use Topics   Alcohol use: No   Drug use: No    Review of Systems Review of Systems  Constitutional:  Negative for chills and fever.  HENT:  Positive for sore throat. Negative for ear pain.   Eyes:  Negative for blurred vision.  Respiratory:  Positive for cough, hemoptysis and sputum production. Negative for shortness of breath.   Cardiovascular:  Negative for chest pain and leg swelling.  Gastrointestinal:  Negative for abdominal pain and vomiting.  Genitourinary:  Negative for dysuria, flank pain and hematuria.  Musculoskeletal:  Negative for myalgias.  Skin:  Negative for rash.  Neurological:  Negative for headaches.    10-point ROS otherwise negative. ____________________________________________   PHYSICAL EXAM:  VITAL SIGNS: ED Triage Vitals  Enc Vitals Group     BP 11/25/2021 1445 131/89     Pulse Rate 11/28/2021 1445  82     Resp 12/02/2021 1445 20     Temp 11/26/2021 1455 98 F (36.7 C)     Temp Source 12/10/2021 1455 Oral     SpO2 12/11/2021 1445 97 %     Weight 12/19/2021 1453 170 lb (77.1 kg)     Height 12/13/2021 1453 5\' 9"  (1.753 m)     Head Circumference --      Peak Flow --      Pain Score 11/24/2021 1452 7     Pain Loc --       Pain Edu? --      Excl. in [place]? --     Physical Exam Constitutional:      General: He is not in acute distress.    Appearance: Normal appearance. He is ill-appearing.  HENT:     Head: Normocephalic.     Nose: Nose normal.     Mouth/Throat:     Mouth: Mucous membranes are moist.     Pharynx: Oropharynx is clear. No oropharyngeal exudate or posterior oropharyngeal erythema.  Eyes:     Conjunctiva/sclera: Conjunctivae normal.     Pupils: Pupils are equal, round, and reactive to light.  Neck:     Comments: Tenderness when palpating the left side of the neck, just under the mandible.  No noticeable masses.  No surrounding erythema. Cardiovascular:     Rate and Rhythm: Normal rate and regular rhythm.  Pulmonary:     Effort: Pulmonary effort is normal. No respiratory distress.     Breath sounds: Rhonchi present. No wheezing or rales.     Comments: Diffuse wheezing/rhonchi across the upper/lower lobes, bilaterally. Abdominal:     General: Abdomen is flat. There is no distension.     Palpations: Abdomen is soft.  Musculoskeletal:        General: Normal range of motion.     Cervical back: Normal range of motion and neck supple. Tenderness present. No rigidity.  Skin:    General: Skin is warm and dry.  Neurological:     General: No focal deficit present.     Mental Status: He is alert and oriented to person, place, and time. Mental status is at baseline.  Psychiatric:        Mood and Affect: Mood normal.        Behavior: Behavior normal.        Thought Content: Thought content normal.        Judgment: Judgment normal.     ____________________________________________    LABS  (all labs ordered are listed, but only abnormal results are displayed)  Labs Reviewed  RESP PANEL BY RT-PCR (FLU A&B, COVID) ARPGX2 - Abnormal; Notable for the following components:      Result Value   SARS Coronavirus 2 by RT PCR POSITIVE (*)    All other components within normal limits   COMPREHENSIVE METABOLIC PANEL - Abnormal; Notable for the following components:   Chloride 112 (*)    CO2 15 (*)    Glucose, Bld 138 (*)    Albumin 3.4 (*)    All other components within normal limits  CBC WITH DIFFERENTIAL/PLATELET - Abnormal; Notable for the following components:   RBC 3.85 (*)    Hemoglobin 10.5 (*)    HCT 34.0 (*)    RDW 15.9 (*)    All other components within normal limits  CULTURE, BLOOD (SINGLE)  TROPONIN I (HIGH SENSITIVITY)  TROPONIN I (HIGH SENSITIVITY)     ____________________________________________   EKG  Ventricular paced rhythm, rate 117, no notable changes since last EKG.  Nondiagnostic.   ____________________________________________    RADIOLOGY I personally viewed and evaluated these images as part of my medical decision making, as well as reviewing the written report by the radiologist.  ED Provider Interpretation: I agree with the interpretation of the radiologist.  DG Chest 2 View  Result Date: 11/30/2021 CLINICAL DATA:  Hemoptysis.  Persistent and worsening throat pain. EXAM: CHEST - 2 VIEW COMPARISON:  12/15/2021 FINDINGS: Cardiac pacemaker. Slightly shallow inspiration. Heart size and pulmonary vascularity are normal. There is developing airspace disease in the right mid lung since prior study suggesting developing pneumonia. No pleural effusions. No pneumothorax. Mediastinal contours appear intact. Calcification of the aorta. IMPRESSION: Developing focus of airspace disease in the right mid lung since prior study suggesting pneumonia. Electronically Signed   By: Lucienne Capers M.D.   On: 12/16/2021 17:15    ____________________________________________   PROCEDURES  Procedures   Medications  azithromycin (ZITHROMAX) 500 mg in sodium chloride 0.9 % 250 mL IVPB (500 mg Intravenous New Bag/Given 12/12/2021 2117)  nitroGLYCERIN (NITROSTAT) SL tablet 0.4 mg (0.4 mg Sublingual Given 12/16/2021 2138)  sodium chloride 0.9 % bolus 1,000  mL (1,000 mLs Intravenous New Bag/Given 12/13/2021 2023)  cefTRIAXone (ROCEPHIN) 1 g in sodium chloride 0.9 % 100 mL IVPB (0 g Intravenous Stopped 12/10/2021 2100)  iohexol (OMNIPAQUE) 300 MG/ML solution 75 mL (75 mLs Intravenous Contrast Given 11/25/2021 2126)  aspirin chewable tablet 324 mg (324 mg Oral Given 12/19/2021 2103)    Critical Care performed: No  ____________________________________________   INITIAL IMPRESSION / ASSESSMENT AND PLAN / ED COURSE  Pertinent labs & imaging results that were available during my care of the patient were reviewed by me and considered in my medical decision making (see chart for details).       Howard [name] is a 87 y.o. male, history of lung cancer, CHF, hypertension, diabetes, WPW, and GERD, presents emergency department for evaluation of sore throat.  Patient states that he was recently diagnosed with COVID 6 days prior.  Since then he has been coughing very frequently and gradually developing a sore throat.  He states that the pain is so bad now that he is unable to swallow pills.  He says the pain is worse on the left side.  Additionally, he reports hemoptysis/bloody mucus, which is more than his usual hemoptysis secondary to lung cancer.  Denies fever/chills, chest pain, shortness of breath, abdominal pain, rashes, or urinary symptoms.  Upon entering the room, patient appears chronically ill, but nontoxic.  Physical exam notable for significant tenderness when palpating the left side of the neck, just under the mandible.  No surrounding erythema.  Throat exam unremarkable.  Lung sounds reveal rhonchi/wheezing diffusely across the upper and lower lobes. Vital signs are within normal limits at this time.  CBC shows hemoglobin of 10.5.  No leukocytosis.  CMP significant for CO2 of 15 and glucose of 138.   Chest x-ray shows developing focus of airspace disease in the right midlung since prior study suggesting pneumonia.  We will go ahead and start fluids and  treat empirically for community-acquired pneumonia with ceftriaxone/azithromycin.   Clinical Course as of 11/25/2021 2152  Thu Dec 20, 2021  2052 Pt began experiencing central chest pain. He does not have his nitroglycerin with him. Will order EKG, troponin, aspirin, and nitroglycerin. [BS]  2108 EKG nondiagnostic.  Ventricular paced rhythm.  Patient states that he feels much better after  the nitroglycerin. [BS]  2151 Patient care handed over to Dr. Blake Divine. [BS]    Clinical Course User Index [BS] Teodoro Spray, PA    ____________________________________________   FINAL CLINICAL IMPRESSION(S) / ED DIAGNOSES  Final diagnoses:  None     NEW MEDICATIONS STARTED DURING THIS VISIT:  ED Discharge Orders     None        Note:  This document was prepared using Dragon voice recognition software and may include unintentional dictation errors.    Teodoro Spray, [place] 12/21/21 [id number]    Vladimir Crofts, MD 12/21/21 [phone]

## 2021-12-20 NOTE — ED Notes (Addendum)
Pt called out stating he has CP. Erlene Quan, [place] at bedside. EKG obtained, nitroglycerin and 324 of aspirin given- see MAR for details

## 2021-12-20 NOTE — ED Triage Notes (Signed)
Pt Present sore throat with coughing up red mucous. Symptom started a week ago.  Pt tested positive for covid on Friday night.

## 2021-12-20 NOTE — ED Provider Notes (Signed)
----------------------------------------- °  9:42 PM on 11/30/2021 -----------------------------------------  Blood pressure (!) 173/83, pulse (!) 114, temperature 98.2 F (36.8 C), temperature source Oral, resp. rate (!) 26, height 5\' 9"  (1.753 m), weight 77.1 kg, SpO2 94 %.  Assuming care from [facility].  In short, Howard [name] is a 87 y.o. male with a chief complaint of Sore Throat .  Refer to the original H&P for additional details.  The current plan of care is to follow-up CT results and troponin for chest pain.  ----------------------------------------- 11:25 PM on 12/03/2021 ----------------------------------------- CT of soft tissue neck is negative for acute process, troponin very mildly elevated but low suspicion for ACS at this time.  On reassessment, patient reports pain is improved and he continues to maintain O2 sats on 3 L nasal cannula.  He is receiving Rocephin and azithromycin for community-acquired pneumonia present on top of previous diagnosis of COVID-19.  Case discussed with hospitalist for admission.    Blake Divine, MD 11/22/2021 [phone]

## 2021-12-20 NOTE — ED Triage Notes (Signed)
Pt here with wife who states ot has had an increase in sore throat since he was diagnosed with covid on Friday- pt has not been able to swallow his pills due to the sore throat- pt tried to go to UC but was told to come here b/c he was going to need a CT scan- pt states pain on the L side worse than the R

## 2021-12-20 NOTE — Discharge Instructions (Signed)
You are in need of further testing than we can provide in the urgent care setting.  Please have your daughter take you to the emergency room now for consideration of a chest CT scan to further evaluate your hemoptysis, coughing up blood.

## 2021-12-20 NOTE — Telephone Encounter (Signed)
°  Chief Complaint: sore throat Symptoms: sore throat, pain and burning Frequency: several days Pertinent Negatives: Patient denies fever Disposition: [] ED /[x] Urgent Care (no appt availability in office) / [] Appointment(In office/virtual)/ []  [facility]/ [] Home Care/ [] Refused Recommended Disposition  Additional Notes: Pt's wife was advised UC d/t no availability in office. She states she has a severe sore throat to where he is having medications get stuck when taking them and when taking Robitussin it burns so bad he refused to take anymore. Pt states to her that only one side of his throat is hurting but he's got to have something to help. She verbalized understanding of going to UC and would arrange transportation.    Reason for Disposition  SEVERE (e.g., excruciating) throat pain  Answer Assessment - Initial Assessment Questions 1. ONSET: "When did the throat start hurting?" (Hours or days ago)      Several days 2. SEVERITY: "How bad is the sore throat?" (Scale 1-10; mild, moderate or severe)   - MILD (1-3):  doesn't interfere with eating or normal activities   - MODERATE (4-7): interferes with eating some solids and normal activities   - SEVERE (8-10):  excruciating pain, interferes with most normal activities   - SEVERE DYSPHAGIA: can't swallow liquids, drooling     severe 4.  VIRAL SYMPTOMS: "Are there any symptoms of a cold, such as a runny nose, cough, hoarse voice or red eyes?"      yes 5. FEVER: "Do you have a fever?" If Yes, ask: "What is your temperature, how was it measured, and when did it start?"     No 6. PUS ON THE TONSILS: "Is there pus on the tonsils in the back of your throat?"     No 7. OTHER SYMPTOMS: "Do you have any other symptoms?" (e.g., difficulty breathing, headache, rash)     Coughed up bloody tinged mucus  Protocols used: Sore Throat-A-AH

## 2021-12-20 NOTE — ED Notes (Addendum)
Pt states CP has resolved. Will continue to monitor.

## 2021-12-20 NOTE — ED Provider Notes (Signed)
Renae Gloss   MRN: [id number] DOB: 04/18/1931  Subjective:   Howard [name] is a 87 y.o. male presenting for 1 week history of persistent and worsening throat pain.  Has started having hemoptysis in the past few days reporting that he has had thick globs of bloody mucus that he spits out.  He has not been able to take his medication due to the throat pain he is having.  Of note, patient did test positive for COVID-19 12/14/2021.  He was seen and treated through the emergency room.  He was also admitted for an acute CHF exacerbation on 12/08/2021.  Has an ejection fraction of 20 to 25% as seen from the same hospitalization.  Has CKD stage III.  Last creatinine level was 1.31 with a GFR 52 from 12/14/2021.  He does have a history of chronic respiratory failure and is on oxygen but did not bring it with him today in clinic.  No current facility-administered medications for this encounter.  Current Outpatient Medications:    acetaminophen (TYLENOL) 500 MG tablet, Take 1,000 mg by mouth every 6 (six) hours as needed for moderate pain., Disp: , Rfl:    albuterol (VENTOLIN HFA) 108 (90 Base) MCG/ACT inhaler, INHALE 2 PUFFS INTO LUNGS EVERY 6 HOURS AS NEEDED FOR WHEEZING OR SHORTNESS OF BREATH, Disp: 8.5 g, Rfl: 6   apixaban (ELIQUIS) 5 MG TABS tablet, Take 5 mg by mouth 2 (two) times daily., Disp: , Rfl:    aspirin EC 81 MG tablet, Take 81 mg by mouth daily., Disp: , Rfl:    cetirizine (ZYRTEC) 10 MG tablet, Take 10 mg by mouth daily., Disp: , Rfl:    Cholecalciferol (VITAMIN D3) 1000 UNITS CAPS, Take 1,000 Units by mouth daily., Disp: , Rfl:    diphenoxylate-atropine (LOMOTIL) 2.5-0.025 MG tablet, Take 1 to 2 tablet by mouth daily as needed for diarrhea, Disp: 120 tablet, Rfl: 3   Fluticasone-Umeclidin-Vilant (TRELEGY ELLIPTA) 100-62.5-25 MCG/INH AEPB, Inhale 1 puff into the lungs daily., Disp: 180 each, Rfl: 3   furosemide (LASIX) 40 MG tablet, Take 1 tablet (40 mg total) by mouth  daily., Disp: 30 tablet, Rfl: 2   glipiZIDE (GLUCOTROL XL) 5 MG 24 hr tablet, Take 1 tablet (5 mg total) by mouth daily with breakfast., Disp: 30 tablet, Rfl: 2   Iron-Vitamin C (VITRON-C) 65-125 MG TABS, Take 1 tablet by mouth daily., Disp: 30 tablet, Rfl: 2   isosorbide mononitrate (IMDUR) 60 MG 24 hr tablet, Take 1 tablet (60 mg total) by mouth daily., Disp: 90 tablet, Rfl: 3   megestrol (MEGACE) 40 MG tablet, Take 1 tablet (40 mg total) by mouth 2 (two) times daily., Disp: 60 tablet, Rfl: 2   metoprolol succinate (TOPROL-XL) 25 MG 24 hr tablet, Take 1 tablet (25 mg total) by mouth at bedtime., Disp: 90 tablet, Rfl: 3   montelukast (SINGULAIR) 10 MG tablet, Take 10 mg by mouth daily as needed (sneezing/allergies.)., Disp: , Rfl:    Multiple Vitamin (MULTIVITAMIN WITH MINERALS) TABS tablet, Take 1 tablet by mouth daily. One-A-Day Multivitamin, Disp: , Rfl:    nirmatrelvir & ritonavir (PAXLOVID, 300/100,) 20 x 150 MG & 10 x 100MG  TBPK, Take 3 tablets by mouth in the morning and at bedtime for 5 days. Follow package directions, Disp: 30 tablet, Rfl: 0   nitroGLYCERIN (NITROSTAT) 0.4 MG SL tablet, PLACE 1 TABLET UNDER TONGUE EVERY 5 MIN AS NEEDED FOR CHEST PAIN IF NO RELIEF IN15 MIN CALL 911 (MAX 2 TABS), Disp: 30  tablet, Rfl: 0   omeprazole (PRILOSEC) 40 MG capsule, TAKE 1 CAPSULE TWICE DAILY AS NEEDED (Patient taking differently: Take 40 mg by mouth daily.), Disp: 180 capsule, Rfl: 3   ondansetron (ZOFRAN-ODT) 4 MG disintegrating tablet, Take 1 tablet (4 mg total) by mouth every 8 (eight) hours as needed for nausea or vomiting., Disp: 20 tablet, Rfl: 0   potassium chloride (KLOR-CON) 10 MEQ tablet, Take 2 tablets (20 mEq total) by mouth daily., Disp: 60 tablet, Rfl: 3   rifaximin (XIFAXAN) 550 MG TABS tablet, Take 1 tablet (550 mg total) by mouth 3 (three) times daily., Disp: 27 tablet, Rfl: 0   sacubitril-valsartan (ENTRESTO) 49-51 MG, Take 1 tablet by mouth 2 (two) times daily., Disp: 60 tablet,  Rfl: 0   sertraline (ZOLOFT) 50 MG tablet, Take 1 tablet (50 mg total) by mouth daily., Disp: 30 tablet, Rfl: 3   simvastatin (ZOCOR) 40 MG tablet, Take 1 tablet (40 mg total) by mouth at bedtime., Disp: 90 tablet, Rfl: 3   spironolactone (ALDACTONE) 25 MG tablet, Take 0.5 tablets (12.5 mg total) by mouth daily., Disp: 30 tablet, Rfl: 5   Allergies  Allergen Reactions   Sulfa Antibiotics Rash    Past Medical History:  Diagnosis Date   Arthritis    Bell palsy    Bell's palsy 04/12/2015   Cancer (HCC)    prostate and skin   Chronic combined systolic and diastolic CHF, NYHA class 1 ([place])    a. 07/2014 Echo: EF 35-40%, Gr 1 DD; b. 03/2021 Echo: EF 20-25%; c. 10/2021 Echo: EF 20-25%, glob HK, GrII DD, nl RV fxn, mod LAE, mild to mod MR, mild-mod AoV scelrosis, Ao root 52mm.   Complete heart block ([place])    a. 11/2010 s/p SJM 2210 [employer] DC PPM, ser# [id number].   Depression    Diabetes mellitus without complication [facility])    Fall 11/10/2014   GERD (gastroesophageal reflux disease)    History of prostate cancer    Hyperlipidemia    Hypertension    LBBB (left bundle branch block)    Left-sided Bell's palsy    Lung cancer ([place]) 2016   NICM (nonischemic cardiomyopathy) ([place])    a. 07/2014 Echo: EF 35-40%, Gr 1 DD; b. 03/2021 Echo: EF 20-25%; c. 03/2021 Cath:  d. 10/2021 Echo: EF 20-25%, glob HK, GrII DD.   Non-obstructive CAD    a. 07/2014 Abnl MV;  b. 08/2014 Cath: LM nl, LAD 30p, RI 40p, LCX nl, OM1 40, RCA dominant 30p, 70d-->Med Rx; c. 03/2021 Cath: LM nl, LAD 20p/m, LCX 30ost/p, OM1 40, OM2 60, RCA 30p, 50d. RHC w/ minimally elevated filling pressures.   Poor balance    Presence of permanent cardiac pacemaker    Sleep apnea    a. cpap   Vertigo    WPW (Wolff-Parkinson-White syndrome)    a. S/P RFCA 1991.     Past Surgical History:  Procedure Laterality Date   ABDOMINAL AORTIC ENDOVASCULAR STENT GRAFT  08/25/2019   Procedure: ABDOMINAL AORTIC ENDOVASCULAR STENT GRAFT;  Surgeon: Algernon Huxley, MD;  Location: [facility] ORS;  Service: Vascular;;   ANGIOPLASTY Left 08/25/2019   Procedure: ANGIOPLASTY;  Surgeon: Algernon Huxley, MD;  Location: [facility] ORS;  Service: Vascular;  Laterality: Left;  left SFA and stent placement   APPLICATION OF WOUND VAC Left 06/07/2015   Procedure: APPLICATION OF WOUND VAC;  Surgeon: Robert Bellow, MD;  Location: [facility] ORS;  Service: General;  Laterality: Left;  left upper back  BACK SURGERY     2011   CARDIAC CATHETERIZATION  08/26/2014   Single vessel obstructive CAD   CARPAL TUNNEL RELEASE  04-04-15   [facility]   CATARACT EXTRACTION  07-31-11 and 09-18-11   Catheter ablation  1991   for WPW   cervical fusion     CHOLECYSTECTOMY  09-07-14   ENDARTERECTOMY FEMORAL Left 08/25/2019   Procedure: ENDARTERECTOMY FEMORAL;  Surgeon: Algernon Huxley, MD;  Location: [facility] ORS;  Service: Vascular;  Laterality: Left;  common and produndis    ENDOVASCULAR REPAIR/STENT GRAFT Right 08/25/2019   Procedure: ENDOVASCULAR REPAIR/STENT GRAFT;  Surgeon: Algernon Huxley, MD;  Location: [facility] OR[facility];  Service: Vascular;  Laterality: Right;  renal artery   HAND SURGERY     right 1993; left 2005   [place] / REPLACE / REMOVE PACEMAKER     INSERTION OF ILIAC STENT Bilateral 08/25/2019   Procedure: INSERTION OF ILIAC STENT;  Surgeon: Algernon Huxley, MD;  Location: [facility] OR[facility];  Service: Vascular;  Laterality: Bilateral;   JOINT REPLACEMENT Left 2013   knee   JOINT REPLACEMENT Right 2004   knee   KNEE SURGERY     left knee 1991 and 1992; right knee [place] N/A 08/26/2014   Procedure: LEFT HEART CATHETERIZATION WITH CORONARY ANGIOGRAM;  Surgeon: [name], MD;  Location: [facility] CATH LAB;  Service: Cardiovascular;  Laterality: N/A;   LOWER EXTREMITY ANGIOGRAPHY Left 08/23/2019   Procedure: Lower Extremity Angiography;  Surgeon: Algernon Huxley, MD;  Location: [facility] CV LAB;  Service: Cardiovascular;  Laterality: Left;   LUMBAR  LAMINECTOMY/DECOMPRESSION MICRODISCECTOMY N/A 06/07/2014   Procedure: LUMBAR FOUR TO FIVE LUMBAR LAMINECTOMY/DECOMPRESSION MICRODISCECTOMY 1 LEVEL;  Surgeon: [name], MD;  Location: [place] [facility]URO ORS;  Service: Neurosurgery;  Laterality: N/A;   LUNG BIOPSY Right 2016   Dr [name]   MOHS SURGERY     PACEMAKER INSERTION     PPM-- St Jude 11/30/10 by [name]   PPM GENERATOR CHANGEOUT N/A 07/09/2019   Procedure: PPM GENERATOR CHANGEOUT;  Surgeon: Deboraha Sprang, MD;  Location: [facility];  Service: Cardiovascular;  Laterality: N/A;   PROSTATE SURGERY     cancer--1998, prostatectomy   REPLACEMENT TOTAL KNEE     2004   RIGHT/LEFT HEART CATH AND CORONARY ANGIOGRAPHY Bilateral 04/13/2021   Procedure: RIGHT/LEFT HEART CATH AND CORONARY ANGIOGRAPHY;  Surgeon: Wellington [place], MD;  Location: [place] CV LAB;  Service: Cardiovascular;  Laterality: Bilateral;   ruptured disc     1962 and 1998   TEE WITHOUT CARDIOVERSION N/A 09/01/2019   Procedure: TRANSESOPHAGEAL ECHOCARDIOGRAM (TEE);  Surgeon: [name], MD;  Location: [facility];  Service: Cardiovascular;  Laterality: N/A;   TEMPORARY PACEMAKER N/A 07/09/2019   Procedure: TEMPORARY PACEMAKER;  Surgeon: Deboraha Sprang, MD;  Location: [place] CV LAB;  Service: Cardiovascular;  Laterality: N/A;   TRIGGER FINGER RELEASE  01-24-15   WOUND DEBRIDEMENT Left 06/07/2015   Procedure: DEBRIDEMENT WOUND;  Surgeon: Robert Bellow, MD;  Location: [facility] OR[facility];  Service: General;  Laterality: Left;  left upper back    Family History  Problem Relation Age of Onset   Heart attack Mother    Hyperlipidemia Mother    CAD Other    Prostate cancer Neg Hx    Cancer Neg Hx     Social History   Tobacco Use   Smoking status: Former  Packs/day: 1.00    Years: 56.00    Pack years: 56.00    Types: Cigarettes    Quit date: 2011    Years since quitting: 12.0   Smokeless tobacco: Never  Vaping Use   Vaping Use: Never used  Substance Use Topics    Alcohol use: No   Drug use: No    ROS   Objective:   Vitals: BP 115/67 (BP Location: Right Arm)    Pulse 96    Temp (!) 97.5 F (36.4 C) (Oral)    Resp 18    SpO2 92%   Physical Exam Constitutional:      General: He is not in acute distress.    Appearance: Normal appearance. He is well-developed. He is ill-appearing (chronic). He is not toxic-appearing or diaphoretic.  HENT:     Head: Normocephalic and atraumatic.     Right Ear: External ear normal.     Left Ear: External ear normal.     Nose: Nose normal.     Mouth/Throat:     Mouth: Mucous membranes are moist. No oral lesions.     Pharynx: Oropharynx is clear. Uvula midline. No pharyngeal swelling, oropharyngeal exudate, posterior oropharyngeal erythema or uvula swelling.     Tonsils: No tonsillar exudate or tonsillar abscesses. 0 on the right. 0 on the left.  Eyes:     General: No scleral icterus.       Right eye: No discharge.        Left eye: No discharge.     Extraocular Movements: Extraocular movements intact.     Pupils: Pupils are equal, round, and reactive to light.  Cardiovascular:     Rate and Rhythm: Normal rate.  Pulmonary:     Effort: No respiratory distress.     Breath sounds: No stridor. Rhonchi (throughout) present. No wheezing or rales.  Chest:     Chest wall: No tenderness.  Musculoskeletal:     Cervical back: Normal range of motion.  Neurological:     Mental Status: He is alert and oriented to person, place, and time.  Psychiatric:        Mood and Affect: Mood normal.        Behavior: Behavior normal.        Thought Content: Thought content normal.        Judgment: Judgment normal.    Recent Results (from the past 2160 hour(s))  CUP PACEART REMOTE DEVICE CHECK     Status: None   Collection Time: 10/05/21  2:00 AM  Result Value Ref Range   Date Time Interrogation Session [phone]    Pulse Generator Manufacturer [employer]    Pulse Gen Model 2272 Assurity MRI    Pulse Gen Serial Number  [id number]    Clinic Name [facility] Pulse Generator Type Implantable Pulse Generator    Implantable Pulse Generator Implant Date 86761950    Implantable Lead Manufacturer [place] IsoFlex Optim    Implantable Lead Serial Number [id number]    Implantable Lead Implant Date 93267124    Implantable Lead Location [id number]    Implantable Lead Manufacturer [facility]    Implantable Lead Model [id number] Tendril STS    Implantable Lead Serial Number [id number]    Implantable Lead Implant Date 33825053    Implantable Lead Location [id number]    Lead Channel Setting Sensing Sensitivity 4.0 mV   Lead Channel Setting Sensing Adaptation Mode Fixed Pacing    Lead Channel  Setting Pacing Amplitude 2.0 V   Lead Channel Setting Pacing Pulse Width 0.5 ms   Lead Channel Setting Pacing Amplitude 1.5 V   Lead Channel Status     Lead Channel Impedance Value 350 ohm   Lead Channel Sensing Intrinsic Amplitude 2.1 mV   Lead Channel Pacing Threshold Amplitude 0.75 V   Lead Channel Pacing Threshold Pulse Width 0.5 ms   Lead Channel Status     Lead Channel Impedance Value 430 ohm   Lead Channel Sensing Intrinsic Amplitude 12.0 mV   Lead Channel Pacing Threshold Amplitude 1.25 V   Lead Channel Pacing Threshold Pulse Width 0.5 ms   Battery Status MOS    Battery Remaining Longevity 86 mo   Battery Remaining Percentage 79.0 %   Battery Voltage 3.01 V   Brady Statistic RA Percent Paced 1.0 %   Brady Statistic RV Percent Paced 99.0 %   Brady Statistic AP VP Percent 1.0 %   Brady Statistic AS VP Percent 99.0 %   Brady Statistic AP VS Percent 1.0 %   Brady Statistic AS VS Percent 1.0 %  CBC     Status: Abnormal   Collection Time: 10/24/21  8:27 AM  Result Value Ref Range   WBC 9.7 4.0 - 10.5 K/uL   RBC 4.60 4.22 - 5.81 MIL/uL   Hemoglobin 12.6 (L) 13.0 - 17.0 g/dL   HCT 39.3 39.0 - 52.0 %   MCV 85.4 80.0 - 100.0 fL   MCH 27.4 26.0 - 34.0 pg   MCHC 32.1 30.0 - 36.0 g/dL    RDW 18.7 (H) 11.5 - 15.5 %   Platelets 282 150 - 400 K/uL   nRBC 0.0 0.0 - 0.2 %    Comment: Performed at [facility], [address] [place], [place] [address]  Comprehensive metabolic panel     Status: Abnormal   Collection Time: 10/24/21  8:27 AM  Result Value Ref Range   Sodium 139 135 - 145 mmol/L   Potassium 4.1 3.5 - 5.1 mmol/L   Chloride 110 98 - 111 mmol/L   CO2 18 (L) 22 - 32 mmol/L   Glucose, Bld 241 (H) 70 - 99 mg/dL    Comment: Glucose reference range applies only to samples taken after fasting for at least 8 hours.   BUN 25 (H) 8 - 23 mg/dL   Creatinine, Ser 1.27 (H) 0.61 - 1.24 mg/dL   Calcium 9.1 8.9 - 10.3 mg/dL   Total Protein 7.2 6.5 - 8.1 g/dL   Albumin 3.8 3.5 - 5.0 g/dL   AST 21 15 - 41 U/L   ALT 15 0 - 44 U/L   Alkaline Phosphatase 49 38 - 126 U/L   Total Bilirubin 0.8 0.3 - 1.2 mg/dL   GFR, Estimated 54 (L) >60 mL/min    Comment: (NOTE) Calculated using the CKD-EPI Creatinine Equation (2021)    Anion gap 11 5 - 15    Comment: Performed at [facility], [address] [place], [place] [address]  Brain natriuretic peptide     Status: Abnormal   Collection Time: 10/24/21  8:27 AM  Result Value Ref Range   B Natriuretic Peptide 4,227.2 (H) 0.0 - 100.0 pg/mL    Comment: Performed at [facility], [address] [place], [place] [address]  Troponin I (High Sensitivity)     Status: Abnormal   Collection Time: 10/24/21  8:27 AM  Result Value Ref Range   Troponin I (High Sensitivity) 70 (H) <  18 ng/L    Comment: (NOTE) Elevated high sensitivity troponin I (hsTnI) values and significant  changes across serial measurements may suggest ACS but many other  chronic and acute conditions are known to elevate hsTnI results.  Refer to the "Links" section for chest pain algorithms and additional  guidance. Performed at [facility], [address] [place], [place] [address]   Resp Panel by RT-PCR (Flu A&B, Covid)  Nasopharyngeal Swab     Status: None   Collection Time: 10/24/21  8:27 AM   Specimen: Nasopharyngeal Swab; Nasopharyngeal(NP) swabs in vial transport medium  Result Value Ref Range   SARS Coronavirus 2 by RT PCR NEGATIVE NEGATIVE    Comment: (NOTE) SARS-CoV-2 target nucleic acids are NOT DETECTED.  The SARS-CoV-2 RNA is generally detectable in upper respiratory specimens during the acute phase of infection. The lowest concentration of SARS-CoV-2 viral copies this assay can detect is 138 copies/mL. A negative result does not preclude SARS-Cov-2 infection and should not be used as the sole basis for treatment or other patient management decisions. A negative result may occur with  improper specimen collection/handling, submission of specimen other than nasopharyngeal swab, presence of viral mutation(s) within the areas targeted by this assay, and inadequate number of viral copies(<138 copies/mL). A negative result must be combined with clinical observations, patient history, and epidemiological information. The expected result is Negative.  Fact Sheet for Patients:  EntrepreneurPulse.com.au  Fact Sheet for Healthcare Providers:  IncredibleEmployment.be  This test is no t yet approved or cleared by the [place] FDA and  has been authorized for detection and/or diagnosis of SARS-CoV-2 by FDA under an Emergency Use Authorization (EUA). This EUA will remain  in effect (meaning this test can be used) for the duration of the COVID-19 declaration under Section 564(b)(1) of the Act, 21 U.S.C.section 360bbb-3(b)(1), unless the authorization is terminated  or revoked sooner.       Influenza A by PCR NEGATIVE NEGATIVE   Influenza B by PCR NEGATIVE NEGATIVE    Comment: (NOTE) The Xpert Xpress SARS-CoV-2/FLU/RSV plus assay is intended as an aid in the diagnosis of influenza from Nasopharyngeal swab specimens and should not be used as a sole basis for  treatment. Nasal washings and aspirates are unacceptable for Xpert Xpress SARS-CoV-2/FLU/RSV testing.  Fact Sheet for Patients: EntrepreneurPulse.com.au  Fact Sheet for Healthcare Providers: IncredibleEmployment.be  This test is not yet approved or cleared by the [place] FDA and has been authorized for detection and/or diagnosis of SARS-CoV-2 by FDA under an Emergency Use Authorization (EUA). This EUA will remain in effect (meaning this test can be used) for the duration of the COVID-19 declaration under Section 564(b)(1) of the Act, 21 U.S.C. section 360bbb-3(b)(1), unless the authorization is terminated or revoked.  Performed at [facility], [address] [place], [place] [address]   Lactic acid, plasma     Status: Abnormal   Collection Time: 10/24/21  8:27 AM  Result Value Ref Range   Lactic Acid, Venous 3.1 (HH) 0.5 - 1.9 mmol/L    Comment: CRITICAL RESULT CALLED TO, READ BACK BY AND VERIFIED WITH Memory Argue 10/24/21 1119 MW//DAS Performed at [facility], [address] [place], [place] [address]   Procalcitonin - Baseline     Status: None   Collection Time: 10/24/21  8:27 AM  Result Value Ref Range   Procalcitonin <0.10 ng/mL    Comment:        Interpretation: PCT (Procalcitonin) <= 0.5 ng/mL: Systemic infection (  sepsis) is not likely. Local bacterial infection is possible. (NOTE)       Sepsis PCT Algorithm           Lower Respiratory Tract                                      Infection PCT Algorithm    ----------------------------     ----------------------------         PCT < 0.25 ng/mL                PCT < 0.10 ng/mL          Strongly encourage             Strongly discourage   discontinuation of antibiotics    initiation of antibiotics    ----------------------------     -----------------------------       PCT 0.25 - 0.50 ng/mL            PCT 0.10 - 0.25 ng/mL               OR       >80%  decrease in PCT            Discourage initiation of                                            antibiotics      Encourage discontinuation           of antibiotics    ----------------------------     -----------------------------         PCT >= 0.50 ng/mL              PCT 0.26 - 0.50 ng/mL               AND        <80% decrease in PCT             Encourage initiation of                                             antibiotics       Encourage continuation           of antibiotics    ----------------------------     -----------------------------        PCT >= 0.50 ng/mL                  PCT > 0.50 ng/mL               AND         increase in PCT                  Strongly encourage                                      initiation of antibiotics    Strongly encourage escalation           of antibiotics                                     -----------------------------  PCT <= 0.25 ng/mL                                                 OR                                        > 80% decrease in PCT                                      Discontinue / Do not initiate                                             antibiotics  Performed at [facility], [address] [place], [place] [address]   Blood culture (routine x 2)     Status: None   Collection Time: 10/24/21  9:47 AM   Specimen: BLOOD  Result Value Ref Range   Specimen Description BLOOD LEFT HAND    Special Requests      BOTTLES DRAWN AEROBIC AND ANAEROBIC Blood Culture results may not be optimal due to an inadequate volume of blood received in culture bottles   Culture      NO GROWTH 5 DAYS Performed at [facility], [address] [place], [place] [address]    Report Status 10/29/2021 FINAL   Blood culture (routine x 2)     Status: None   Collection Time: 10/24/21 10:04 AM   Specimen: BLOOD  Result Value Ref Range   Specimen Description BLOOD RIGHT FA    Special  Requests      BOTTLES DRAWN AEROBIC AND ANAEROBIC Blood Culture results may not be optimal due to an inadequate volume of blood received in culture bottles   Culture      NO GROWTH 5 DAYS Performed at [facility], [address], [place], [place] [address]    Report Status 10/29/2021 FINAL   MRSA Next Gen by PCR, Nasal     Status: None   Collection Time: 10/24/21 10:04 AM   Specimen: Nasal Mucosa; Nasal Swab  Result Value Ref Range   MRSA by PCR Next Gen NOT DETECTED NOT DETECTED    Comment: (NOTE) The GeneXpert MRSA Assay (FDA approved for NASAL specimens only), is one component of a comprehensive MRSA colonization surveillance program. It is not intended to diagnose MRSA infection nor to guide or monitor treatment for MRSA infections. Test performance is not FDA approved in patients less than 90 years old. Performed at [facility], [address] [place], [place] [address]   Troponin I (High Sensitivity)     Status: Abnormal   Collection Time: 10/24/21 10:36 AM  Result Value Ref Range   Troponin I (High Sensitivity) 180 (HH) <18 ng/L    Comment: CRITICAL RESULT CALLED TO, READ BACK BY AND VERIFIED WITH  KEYRI MORTENEZ 10/24/21 1122 MW/DAS (NOTE) Elevated high sensitivity troponin I (hsTnI) values and significant  changes across serial measurements may suggest ACS but many other  chronic and acute conditions are known to elevate hsTnI results.  Refer to the "  Links" section for chest pain algorithms and additional  guidance. Performed at [facility], [address] [place], [place] [address]   Lactic acid, plasma     Status: Abnormal   Collection Time: 10/24/21  2:35 PM  Result Value Ref Range   Lactic Acid, Venous 2.2 (HH) 0.5 - 1.9 mmol/L    Comment: CRITICAL VALUE NOTED. VALUE IS CONSISTENT WITH PREVIOUSLY REPORTED/CALLED VALUE MU Performed at [facility], [address] [place], [place] [address]   CBG monitoring, ED      Status: Abnormal   Collection Time: 10/24/21  5:18 PM  Result Value Ref Range   Glucose-Capillary 141 (H) 70 - 99 mg/dL    Comment: Glucose reference range applies only to samples taken after fasting for at least 8 hours.  Hemoglobin A1c     Status: Abnormal   Collection Time: 10/24/21  5:28 PM  Result Value Ref Range   Hgb A1c MFr Bld 8.2 (H) 4.8 - 5.6 %    Comment: (NOTE) Pre diabetes:          5.7%-6.4%  Diabetes:              >6.4%  Glycemic control for   <7.0% adults with diabetes    Mean Plasma Glucose 188.64 mg/dL    Comment: Performed at [facility] [address], [place], [place] [address]  Troponin I (High Sensitivity)     Status: Abnormal   Collection Time: 10/24/21  5:28 PM  Result Value Ref Range   Troponin I (High Sensitivity) 2,390 (HH) <18 ng/L    Comment: CRITICAL VALUE NOTED. VALUE IS CONSISTENT WITH PREVIOUSLY REPORTED/CALLED VALUE MU (NOTE) Elevated high sensitivity troponin I (hsTnI) values and significant  changes across serial measurements may suggest ACS but many other  chronic and acute conditions are known to elevate hsTnI results.  Refer to the "Links" section for chest pain algorithms and additional  guidance. Performed at [facility], [address] [place], [place] [address]   Strep pneumoniae urinary antigen     Status: None   Collection Time: 10/24/21  5:28 PM  Result Value Ref Range   Strep Pneumo Urinary Antigen NEGATIVE NEGATIVE    Comment:        Infection due to S. pneumoniae cannot be absolutely ruled out since the antigen present may be below the detection limit of the test. Performed at [facility], [address], [place], [place] [address]   Legionella Pneumophila Serogp 1 Ur Ag     Status: None   Collection Time: 10/24/21  5:28 PM  Result Value Ref Range   L. pneumophila Serogp 1 Ur Ag Negative Negative    Comment: (NOTE) Presumptive negative for L. pneumophila serogroup 1 antigen in  urine, suggesting no recent or current infection. Legionnaires' disease cannot be ruled out since other serogroups and species may also cause disease. Performed At: [facility] [place], [place] [id number] Rush Farmer MD [id number]    Source of Sample URINE, RANDOM     Comment: Performed at [facility], [address] [place], [place] [address]  C-reactive protein     Status: None   Collection Time: 10/24/21  5:28 PM  Result Value Ref Range   CRP <0.5 <1.0 mg/dL    Comment: Performed at [facility], [place] [address], [place], [place] [address]  Histoplasma antigen, urine     Status: None (Preliminary result)   Collection  Time: 10/24/21  5:28 PM  Result Value Ref Range   Histoplasma Antigen, urine <0.5 <0.5 ng/mL    Comment: (NOTE) This test was developed and its performance characteristics determined by [facility]. It has not been cleared or approved by the Food and Drug Administration. Performed At: [facility] [place], [place] [id number] Rush Farmer MD [id number]    Disclaimer: PENDING   Troponin I (High Sensitivity)     Status: Abnormal   Collection Time: 10/24/21  5:28 PM  Result Value Ref Range   Troponin I (High Sensitivity) 12,859 (HH) <18 ng/L    Comment: CRITICAL VALUE NOTED. VALUE IS CONSISTENT WITH PREVIOUSLY REPORTED/CALLED VALUE HNM (NOTE) Elevated high sensitivity troponin I (hsTnI) values and significant  changes across serial measurements may suggest ACS but many other  chronic and acute conditions are known to elevate hsTnI results.  Refer to the "Links" section for chest pain algorithms and additional  guidance. Performed at [facility], [address] [place], [place] [address]   CBG monitoring, ED     Status: Abnormal   Collection Time: 10/24/21 10:06 PM  Result Value Ref Range   Glucose-Capillary 165 (H) 70 - 99 mg/dL    Comment: Glucose reference range applies only  to samples taken after fasting for at least 8 hours.  Basic metabolic panel     Status: Abnormal   Collection Time: 10/25/21  4:46 AM  Result Value Ref Range   Sodium 139 135 - 145 mmol/L   Potassium 3.9 3.5 - 5.1 mmol/L   Chloride 111 98 - 111 mmol/L   CO2 20 (L) 22 - 32 mmol/L   Glucose, Bld 160 (H) 70 - 99 mg/dL    Comment: Glucose reference range applies only to samples taken after fasting for at least 8 hours.   BUN 25 (H) 8 - 23 mg/dL   Creatinine, Ser 1.11 0.61 - 1.24 mg/dL   Calcium 9.0 8.9 - 10.3 mg/dL   GFR, Estimated >60 >60 mL/min    Comment: (NOTE) Calculated using the CKD-EPI Creatinine Equation (2021)    Anion gap 8 5 - 15    Comment: Performed at [facility], [address] [place], [place] [address]  Glucose, capillary     Status: Abnormal   Collection Time: 10/25/21  7:36 AM  Result Value Ref Range   Glucose-Capillary 188 (H) 70 - 99 mg/dL    Comment: Glucose reference range applies only to samples taken after fasting for at least 8 hours.  ECHOCARDIOGRAM COMPLETE     Status: None   Collection Time: 10/25/21  9:00 AM  Result Value Ref Range   Weight 2,962.98 oz   BP 123/69 mmHg   Ao pk vel 1.10 m/s   AV Area VTI 2.19 cm2   AR max vel 2.14 cm2   AV Mean grad 2.0 mmHg   AV Peak grad 4.8 mmHg   S' Lateral 3.46 cm   AV Area mean vel 2.22 cm2   Area-P 1/2 5.58 cm2   MV VTI 1.06 cm2  Fungitell, Serum     Status: None   Collection Time: 10/25/21  9:02 AM  Result Value Ref Range   Fungitell Result <31 <80 pg/mL    Comment: (NOTE) Interpretation: The Fungitell assay does not detect certain fungal species such as the genus Cryptococcus Baldomero Lamy et al. [phone]) which produces very low levels of (1-3)-Beta-D-Glucan. The assay also does not detect the Zygomycetes such as Absidia, Mucor and Rhizopus (Mitsuya  et al. Brantley Fling) which are not known to produce (1-3)-Beta-D-Glucan. In addition, the yeast phase of Blastomyces dermatitidis produces  little (1-3)-Beta-D-Glucan and may not be detected by the assay Charise Carwin et al. 2007). Reference Range: Less than 60 pg/mL. Glucan values of less than 60 pg/mL are interpreted as negative. Glucan values of 60 to 79 pg/mL are interpreted as indeterminate, and suggest a possible fungal infection. Additional sampling and testing of sera is required to interpret the results. Glucan values of greater than or equal to 80 pg/mL are interpreted as positive. Due to the potential for environmental contamination when transferred to pour-off tubes, which can lead to false  positive results, interpret positive results from samples provided in pour-off tubes with caution.  Results should be used in conjunction with clinical findings, and should not form the sole basis for a diagnosis or treatment decision. The Fungitell test is approved or cleared for in vitro diagnostic use by the [place] and Drug Administration. Modifications to the approved package insert have been made and the performance characteristics for these modifications were determined by [facility]. If sample result is greater than 500 pg/mL, physician may order a titer of the sample. Please contact [facility] if you would like to order a retest of this sample to obtain an actual value. Samples are held for 1 week after initial testing date. Performed At: [facility] [address] [place], [place] [id number] Carlean Purl R PhD [id number]   Troponin I (High Sensitivity)     Status: Abnormal   Collection Time: 10/25/21  9:02 AM  Result Value Ref Range   Troponin I (High Sensitivity) 10,829 (HH) <18 ng/L    Comment: CRITICAL VALUE NOTED. VALUE IS CONSISTENT WITH PREVIOUSLY REPORTED/CALLED VALUE DAS (NOTE) Elevated high sensitivity troponin I (hsTnI) values and significant  changes across serial measurements may suggest ACS but many other  chronic and acute conditions are known to elevate hsTnI  results.  Refer to the "Links" section for chest pain algorithms and additional  guidance. Performed at [facility], [address] [place], [place] [address]   Procalcitonin - Baseline     Status: None   Collection Time: 10/25/21  9:02 AM  Result Value Ref Range   Procalcitonin <0.10 ng/mL    Comment:        Interpretation: PCT (Procalcitonin) <= 0.5 ng/mL: Systemic infection (sepsis) is not likely. Local bacterial infection is possible. (NOTE)       Sepsis PCT Algorithm           Lower Respiratory Tract                                      Infection PCT Algorithm    ----------------------------     ----------------------------         PCT < 0.25 ng/mL                PCT < 0.10 ng/mL          Strongly encourage             Strongly discourage   discontinuation of antibiotics    initiation of antibiotics    ----------------------------     -----------------------------       PCT 0.25 - 0.50 ng/mL            PCT 0.10 - 0.25 ng/mL  OR       >80% decrease in PCT            Discourage initiation of                                            antibiotics      Encourage discontinuation           of antibiotics    ----------------------------     -----------------------------         PCT >= 0.50 ng/mL              PCT 0.26 - 0.50 ng/mL               AND        <80% decrease in PCT             Encourage initiation of                                             antibiotics       Encourage continuation           of antibiotics    ----------------------------     -----------------------------        PCT >= 0.50 ng/mL                  PCT > 0.50 ng/mL               AND         increase in PCT                  Strongly encourage                                      initiation of antibiotics    Strongly encourage escalation           of antibiotics                                     -----------------------------                                           PCT <=  0.25 ng/mL                                                 OR                                        > 80% decrease in PCT                                      Discontinue / Do not initiate  antibiotics  Performed at [facility], [address] [place], [place] [address]   APTT     Status: Abnormal   Collection Time: 10/25/21  9:02 AM  Result Value Ref Range   aPTT 42 (H) 24 - 36 seconds    Comment:        IF BASELINE aPTT IS ELEVATED, SUGGEST PATIENT RISK ASSESSMENT BE USED TO DETERMINE APPROPRIATE ANTICOAGULANT THERAPY. Performed at [facility], [address] [place], [place] [address]   CBC     Status: Abnormal   Collection Time: 10/25/21  9:02 AM  Result Value Ref Range   WBC 7.2 4.0 - 10.5 K/uL   RBC 3.97 (L) 4.22 - 5.81 MIL/uL   Hemoglobin 11.1 (L) 13.0 - 17.0 g/dL   HCT 33.2 (L) 39.0 - 52.0 %   MCV 83.6 80.0 - 100.0 fL   MCH 28.0 26.0 - 34.0 pg   MCHC 33.4 30.0 - 36.0 g/dL   RDW 18.4 (H) 11.5 - 15.5 %   Platelets 208 150 - 400 K/uL   nRBC 0.0 0.0 - 0.2 %    Comment: Performed at [facility], [address] [place], [place] [address]  Heparin level (unfractionated)     Status: Abnormal   Collection Time: 10/25/21  9:02 AM  Result Value Ref Range   Heparin Unfractionated >1.10 (H) 0.30 - 0.70 IU/mL    Comment: (NOTE) The clinical reportable range upper limit is being lowered to >1.10 to align with the FDA approved guidance for the current laboratory assay.  If heparin results are below expected values, and patient dosage has  been confirmed, suggest follow up testing of antithrombin III levels. Performed at [facility], [address] [place], [place] [address]   Protime-INR     Status: Abnormal   Collection Time: 10/25/21  9:02 AM  Result Value Ref Range   Prothrombin Time 17.4 (H) 11.4 - 15.2 seconds   INR 1.4 (H) 0.8 - 1.2    Comment: (NOTE) INR goal varies based  on device and disease states. Performed at [facility], [address] [place], [place] [address]   Troponin I (High Sensitivity)     Status: Abnormal   Collection Time: 10/25/21 10:33 AM  Result Value Ref Range   Troponin I (High Sensitivity) 10,263 (HH) <18 ng/L    Comment: CRITICAL VALUE NOTED. VALUE IS CONSISTENT WITH PREVIOUSLY REPORTED/CALLED VALUE DAS (NOTE) Elevated high sensitivity troponin I (hsTnI) values and significant  changes across serial measurements may suggest ACS but many other  chronic and acute conditions are known to elevate hsTnI results.  Refer to the "Links" section for chest pain algorithms and additional  guidance. Performed at [facility], [address] [place], [place] [address]   Glucose, capillary     Status: Abnormal   Collection Time: 10/25/21 11:22 AM  Result Value Ref Range   Glucose-Capillary 207 (H) 70 - 99 mg/dL    Comment: Glucose reference range applies only to samples taken after fasting for at least 8 hours.  Glucose, capillary     Status: Abnormal   Collection Time: 10/25/21  4:10 PM  Result Value Ref Range   Glucose-Capillary 165 (H) 70 - 99 mg/dL    Comment: Glucose reference range applies only to samples taken after fasting for at least 8 hours.  APTT     Status: Abnormal   Collection Time: 10/25/21  6:22 PM  Result Value Ref Range   aPTT  142 (H) 24 - 36 seconds    Comment:        IF BASELINE aPTT IS ELEVATED, SUGGEST PATIENT RISK ASSESSMENT BE USED TO DETERMINE APPROPRIATE ANTICOAGULANT THERAPY. Performed at [facility], [address] [place], [place] [address]   Glucose, capillary     Status: Abnormal   Collection Time: 10/25/21  8:25 PM  Result Value Ref Range   Glucose-Capillary 177 (H) 70 - 99 mg/dL    Comment: Glucose reference range applies only to samples taken after fasting for at least 8 hours.  Basic metabolic panel     Status: Abnormal   Collection Time: 10/26/21  4:34 AM   Result Value Ref Range   Sodium 137 135 - 145 mmol/L   Potassium 3.9 3.5 - 5.1 mmol/L   Chloride 111 98 - 111 mmol/L   CO2 15 (L) 22 - 32 mmol/L   Glucose, Bld 173 (H) 70 - 99 mg/dL    Comment: Glucose reference range applies only to samples taken after fasting for at least 8 hours.   BUN 30 (H) 8 - 23 mg/dL   Creatinine, Ser 1.18 0.61 - 1.24 mg/dL   Calcium 9.0 8.9 - 10.3 mg/dL   GFR, Estimated 59 (L) >60 mL/min    Comment: (NOTE) Calculated using the CKD-EPI Creatinine Equation (2021)    Anion gap 11 5 - 15    Comment: Performed at [facility], [address] [place], [place] [address]  Procalcitonin     Status: None   Collection Time: 10/26/21  4:34 AM  Result Value Ref Range   Procalcitonin <0.10 ng/mL    Comment:        Interpretation: PCT (Procalcitonin) <= 0.5 ng/mL: Systemic infection (sepsis) is not likely. Local bacterial infection is possible. (NOTE)       Sepsis PCT Algorithm           Lower Respiratory Tract                                      Infection PCT Algorithm    ----------------------------     ----------------------------         PCT < 0.25 ng/mL                PCT < 0.10 ng/mL          Strongly encourage             Strongly discourage   discontinuation of antibiotics    initiation of antibiotics    ----------------------------     -----------------------------       PCT 0.25 - 0.50 ng/mL            PCT 0.10 - 0.25 ng/mL               OR       >80% decrease in PCT            Discourage initiation of                                            antibiotics      Encourage discontinuation           of antibiotics    ----------------------------     -----------------------------  PCT >= 0.50 ng/mL              PCT 0.26 - 0.50 ng/mL               AND        <80% decrease in PCT             Encourage initiation of                                             antibiotics       Encourage continuation           of antibiotics     ----------------------------     -----------------------------        PCT >= 0.50 ng/mL                  PCT > 0.50 ng/mL               AND         increase in PCT                  Strongly encourage                                      initiation of antibiotics    Strongly encourage escalation           of antibiotics                                     -----------------------------                                           PCT <= 0.25 ng/mL                                                 OR                                        > 80% decrease in PCT                                      Discontinue / Do not initiate                                             antibiotics  Performed at [facility], [address] [place], [place] [address]   Heparin level (unfractionated)     Status: Abnormal   Collection Time: 10/26/21  4:34 AM  Result Value Ref Range   Heparin Unfractionated 0.78 (H) 0.30 - 0.70 IU/mL    Comment: (NOTE) The clinical reportable range upper limit is being lowered to >1.10 to  align with the FDA approved guidance for the current laboratory assay.  If heparin results are below expected values, and patient dosage has  been confirmed, suggest follow up testing of antithrombin III levels. Performed at [facility], [address] [place], [place] [address]   CBC     Status: Abnormal   Collection Time: 10/26/21  4:34 AM  Result Value Ref Range   WBC 6.7 4.0 - 10.5 K/uL   RBC 3.73 (L) 4.22 - 5.81 MIL/uL   Hemoglobin 10.2 (L) 13.0 - 17.0 g/dL   HCT 31.8 (L) 39.0 - 52.0 %   MCV 85.3 80.0 - 100.0 fL   MCH 27.3 26.0 - 34.0 pg   MCHC 32.1 30.0 - 36.0 g/dL   RDW 18.3 (H) 11.5 - 15.5 %   Platelets 191 150 - 400 K/uL   nRBC 0.0 0.0 - 0.2 %    Comment: Performed at [facility], [address] [place], [place] [address]  Lactic acid, plasma     Status: None   Collection Time: 10/26/21  4:34 AM  Result Value Ref Range   Lactic Acid,  Venous 0.8 0.5 - 1.9 mmol/L    Comment: Performed at [facility], [address] [place], [place] [address]  APTT     Status: Abnormal   Collection Time: 10/26/21  4:34 AM  Result Value Ref Range   aPTT 60 (H) 24 - 36 seconds    Comment:        IF BASELINE aPTT IS ELEVATED, SUGGEST PATIENT RISK ASSESSMENT BE USED TO DETERMINE APPROPRIATE ANTICOAGULANT THERAPY. Performed at [facility], [address] [place], [place] [address]   Glucose, capillary     Status: Abnormal   Collection Time: 10/26/21  7:34 AM  Result Value Ref Range   Glucose-Capillary 178 (H) 70 - 99 mg/dL    Comment: Glucose reference range applies only to samples taken after fasting for at least 8 hours.  Glucose, capillary     Status: Abnormal   Collection Time: 10/26/21 11:18 AM  Result Value Ref Range   Glucose-Capillary 174 (H) 70 - 99 mg/dL    Comment: Glucose reference range applies only to samples taken after fasting for at least 8 hours.  Glucose, capillary     Status: Abnormal   Collection Time: 10/26/21  4:39 PM  Result Value Ref Range   Glucose-Capillary 232 (H) 70 - 99 mg/dL    Comment: Glucose reference range applies only to samples taken after fasting for at least 8 hours.  APTT     Status: Abnormal   Collection Time: 10/26/21  4:48 PM  Result Value Ref Range   aPTT 71 (H) 24 - 36 seconds    Comment:        IF BASELINE aPTT IS ELEVATED, SUGGEST PATIENT RISK ASSESSMENT BE USED TO DETERMINE APPROPRIATE ANTICOAGULANT THERAPY. Performed at [facility], [address] [place], [place] [address]   Glucose, capillary     Status: Abnormal   Collection Time: 10/26/21  9:03 PM  Result Value Ref Range   Glucose-Capillary 188 (H) 70 - 99 mg/dL    Comment: Glucose reference range applies only to samples taken after fasting for at least 8 hours.  APTT     Status: Abnormal   Collection Time: 10/27/21 12:40 AM  Result Value Ref Range   aPTT 87 (H) 24 - 36 seconds     Comment:        IF BASELINE  aPTT IS ELEVATED, SUGGEST PATIENT RISK ASSESSMENT BE USED TO DETERMINE APPROPRIATE ANTICOAGULANT THERAPY. Performed at [facility], [address] [place], [place] [address]   Basic metabolic panel     Status: Abnormal   Collection Time: 10/27/21  3:08 AM  Result Value Ref Range   Sodium 133 (L) 135 - 145 mmol/L   Potassium 3.5 3.5 - 5.1 mmol/L   Chloride 104 98 - 111 mmol/L   CO2 19 (L) 22 - 32 mmol/L   Glucose, Bld 297 (H) 70 - 99 mg/dL    Comment: Glucose reference range applies only to samples taken after fasting for at least 8 hours.   BUN 32 (H) 8 - 23 mg/dL   Creatinine, Ser 1.10 0.61 - 1.24 mg/dL   Calcium 8.7 (L) 8.9 - 10.3 mg/dL   GFR, Estimated >60 >60 mL/min    Comment: (NOTE) Calculated using the CKD-EPI Creatinine Equation (2021)    Anion gap 10 5 - 15    Comment: Performed at [facility], [address] [place], [place] [address]  Procalcitonin     Status: None   Collection Time: 10/27/21  3:08 AM  Result Value Ref Range   Procalcitonin 0.10 ng/mL    Comment:        Interpretation: PCT (Procalcitonin) <= 0.5 ng/mL: Systemic infection (sepsis) is not likely. Local bacterial infection is possible. (NOTE)       Sepsis PCT Algorithm           Lower Respiratory Tract                                      Infection PCT Algorithm    ----------------------------     ----------------------------         PCT < 0.25 ng/mL                PCT < 0.10 ng/mL          Strongly encourage             Strongly discourage   discontinuation of antibiotics    initiation of antibiotics    ----------------------------     -----------------------------       PCT 0.25 - 0.50 ng/mL            PCT 0.10 - 0.25 ng/mL               OR       >80% decrease in PCT            Discourage initiation of                                            antibiotics      Encourage discontinuation           of antibiotics     ----------------------------     -----------------------------         PCT >= 0.50 ng/mL              PCT 0.26 - 0.50 ng/mL               AND        <80% decrease in PCT             Encourage initiation of  antibiotics       Encourage continuation           of antibiotics    ----------------------------     -----------------------------        PCT >= 0.50 ng/mL                  PCT > 0.50 ng/mL               AND         increase in PCT                  Strongly encourage                                      initiation of antibiotics    Strongly encourage escalation           of antibiotics                                     -----------------------------                                           PCT <= 0.25 ng/mL                                                 OR                                        > 80% decrease in PCT                                      Discontinue / Do not initiate                                             antibiotics  Performed at [facility], [address] [place], [place] [address]   Heparin level (unfractionated)     Status: Abnormal   Collection Time: 10/27/21  3:08 AM  Result Value Ref Range   Heparin Unfractionated >1.10 (H) 0.30 - 0.70 IU/mL    Comment: (NOTE) The clinical reportable range upper limit is being lowered to >1.10 to align with the FDA approved guidance for the current laboratory assay.  If heparin results are below expected values, and patient dosage has  been confirmed, suggest follow up testing of antithrombin III levels. Performed at [facility], [address] [place], [place] [address]   CBC     Status: Abnormal   Collection Time: 10/27/21  3:08 AM  Result Value Ref Range   WBC 4.9 4.0 - 10.5 K/uL   RBC 3.55 (L) 4.22 - 5.81 MIL/uL   Hemoglobin 9.7 (L) 13.0 - 17.0 g/dL   HCT 29.6 (L) 39.0 - 52.0 %   MCV 83.4 80.0 -  100.0 fL   MCH 27.3 26.0 - 34.0 pg    MCHC 32.8 30.0 - 36.0 g/dL   RDW 17.7 (H) 11.5 - 15.5 %   Platelets 190 150 - 400 K/uL   nRBC 0.0 0.0 - 0.2 %    Comment: Performed at [facility], [address] [place], [place] [address]  APTT     Status: Abnormal   Collection Time: 10/27/21  3:08 AM  Result Value Ref Range   aPTT >200 (HH) 24 - 36 seconds    Comment: RESULT REPEATED AND VERIFIED CRITICAL RESULT CALLED TO, READ BACK BY AND VERIFIED WITH: [facility] MEYERS AT 0973 10/27/21.PMF        IF BASELINE aPTT IS ELEVATED, SUGGEST PATIENT RISK ASSESSMENT BE USED TO DETERMINE APPROPRIATE ANTICOAGULANT THERAPY. Performed at [facility], [place], [place] [address]   Heparin level (unfractionated)     Status: None   Collection Time: 10/27/21  5:49 AM  Result Value Ref Range   Heparin Unfractionated 0.44 0.30 - 0.70 IU/mL    Comment: (NOTE) The clinical reportable range upper limit is being lowered to >1.10 to align with the FDA approved guidance for the current laboratory assay.  If heparin results are below expected values, and patient dosage has  been confirmed, suggest follow up testing of antithrombin III levels. Performed at [facility], [address] [place], [place] [address]   APTT     Status: Abnormal   Collection Time: 10/27/21  5:49 AM  Result Value Ref Range   aPTT 77 (H) 24 - 36 seconds    Comment:        IF BASELINE aPTT IS ELEVATED, SUGGEST PATIENT RISK ASSESSMENT BE USED TO DETERMINE APPROPRIATE ANTICOAGULANT THERAPY. Performed at [facility], [address] [place], [place] [address]   Glucose, capillary     Status: Abnormal   Collection Time: 10/27/21  7:26 AM  Result Value Ref Range   Glucose-Capillary 158 (H) 70 - 99 mg/dL    Comment: Glucose reference range applies only to samples taken after fasting for at least 8 hours.  Glucose, capillary     Status: Abnormal   Collection Time: 10/27/21 12:12 PM  Result Value Ref Range    Glucose-Capillary 155 (H) 70 - 99 mg/dL    Comment: Glucose reference range applies only to samples taken after fasting for at least 8 hours.  Glucose, capillary     Status: Abnormal   Collection Time: 10/27/21  4:18 PM  Result Value Ref Range   Glucose-Capillary 247 (H) 70 - 99 mg/dL    Comment: Glucose reference range applies only to samples taken after fasting for at least 8 hours.  Comprehensive metabolic panel     Status: Abnormal   Collection Time: 10/27/21  5:49 PM  Result Value Ref Range   Sodium 136 135 - 145 mmol/L   Potassium 3.7 3.5 - 5.1 mmol/L   Chloride 107 98 - 111 mmol/L   CO2 20 (L) 22 - 32 mmol/L   Glucose, Bld 218 (H) 70 - 99 mg/dL    Comment: Glucose reference range applies only to samples taken after fasting for at least 8 hours.   BUN 36 (H) 8 - 23 mg/dL   Creatinine, Ser 1.30 (H) 0.61 - 1.24 mg/dL   Calcium 8.9 8.9 - 10.3 mg/dL   Total Protein 6.9 6.5 - 8.1 g/dL   Albumin 3.2 (L) 3.5 - 5.0 g/dL   AST 26 15 -  41 U/L   ALT 18 0 - 44 U/L   Alkaline Phosphatase 48 38 - 126 U/L   Total Bilirubin 0.7 0.3 - 1.2 mg/dL   GFR, Estimated 53 (L) >60 mL/min    Comment: (NOTE) Calculated using the CKD-EPI Creatinine Equation (2021)    Anion gap 9 5 - 15    Comment: Performed at [facility], [address] [place], [place] [address]  Glucose, capillary     Status: Abnormal   Collection Time: 10/27/21  9:44 PM  Result Value Ref Range   Glucose-Capillary 133 (H) 70 - 99 mg/dL    Comment: Glucose reference range applies only to samples taken after fasting for at least 8 hours.  Basic metabolic panel     Status: Abnormal   Collection Time: 10/28/21  4:19 AM  Result Value Ref Range   Sodium 137 135 - 145 mmol/L   Potassium 4.1 3.5 - 5.1 mmol/L   Chloride 108 98 - 111 mmol/L   CO2 24 22 - 32 mmol/L   Glucose, Bld 176 (H) 70 - 99 mg/dL    Comment: Glucose reference range applies only to samples taken after fasting for at least 8 hours.   BUN 38 (H) 8 - 23  mg/dL   Creatinine, Ser 1.33 (H) 0.61 - 1.24 mg/dL   Calcium 9.1 8.9 - 10.3 mg/dL   GFR, Estimated 51 (L) >60 mL/min    Comment: (NOTE) Calculated using the CKD-EPI Creatinine Equation (2021)    Anion gap 5 5 - 15    Comment: Performed at [facility], [address] [place], [place] [address]  CBC with Differential/Platelet     Status: Abnormal   Collection Time: 10/28/21  4:19 AM  Result Value Ref Range   WBC 5.4 4.0 - 10.5 K/uL   RBC 3.82 (L) 4.22 - 5.81 MIL/uL   Hemoglobin 10.5 (L) 13.0 - 17.0 g/dL   HCT 31.6 (L) 39.0 - 52.0 %   MCV 82.7 80.0 - 100.0 fL   MCH 27.5 26.0 - 34.0 pg   MCHC 33.2 30.0 - 36.0 g/dL   RDW 17.5 (H) 11.5 - 15.5 %   Platelets 224 150 - 400 K/uL   nRBC 0.0 0.0 - 0.2 %   Neutrophils Relative % 67 %   Neutro Abs 3.6 1.7 - 7.7 K/uL   Lymphocytes Relative 15 %   Lymphs Abs 0.8 0.7 - 4.0 K/uL   Monocytes Relative 11 %   Monocytes Absolute 0.6 0.1 - 1.0 K/uL   Eosinophils Relative 6 %   Eosinophils Absolute 0.3 0.0 - 0.5 K/uL   Basophils Relative 1 %   Basophils Absolute 0.0 0.0 - 0.1 K/uL   Immature Granulocytes 0 %   Abs Immature Granulocytes 0.02 0.00 - 0.07 K/uL    Comment: Performed at [facility], [address] [place], [place] [address]  Glucose, capillary     Status: Abnormal   Collection Time: 10/28/21  7:46 AM  Result Value Ref Range   Glucose-Capillary 164 (H) 70 - 99 mg/dL    Comment: Glucose reference range applies only to samples taken after fasting for at least 8 hours.  Glucose, capillary     Status: Abnormal   Collection Time: 10/28/21 12:04 PM  Result Value Ref Range   Glucose-Capillary 131 (H) 70 - 99 mg/dL    Comment: Glucose reference range applies only to samples taken after fasting for at least 8 hours.  CBC with Differential  Status: Abnormal   Collection Time: 11/05/21  3:15 AM  Result Value Ref Range   WBC 5.4 4.0 - 10.5 K/uL   RBC 3.94 (L) 4.22 - 5.81 MIL/uL   Hemoglobin 10.9 (L) 13.0 - 17.0  g/dL   HCT 33.5 (L) 39.0 - 52.0 %   MCV 85.0 80.0 - 100.0 fL   MCH 27.7 26.0 - 34.0 pg   MCHC 32.5 30.0 - 36.0 g/dL   RDW 16.6 (H) 11.5 - 15.5 %   Platelets 269 150 - 400 K/uL   nRBC 0.0 0.0 - 0.2 %   Neutrophils Relative % 73 %   Neutro Abs 4.0 1.7 - 7.7 K/uL   Lymphocytes Relative 15 %   Lymphs Abs 0.8 0.7 - 4.0 K/uL   Monocytes Relative 8 %   Monocytes Absolute 0.5 0.1 - 1.0 K/uL   Eosinophils Relative 2 %   Eosinophils Absolute 0.1 0.0 - 0.5 K/uL   Basophils Relative 1 %   Basophils Absolute 0.0 0.0 - 0.1 K/uL   Immature Granulocytes 1 %   Abs Immature Granulocytes 0.03 0.00 - 0.07 K/uL    Comment: Performed at [facility], [address] [place], [place] [address]  Comprehensive metabolic panel     Status: Abnormal   Collection Time: 11/05/21  3:15 AM  Result Value Ref Range   Sodium 134 (L) 135 - 145 mmol/L   Potassium 5.1 3.5 - 5.1 mmol/L    Comment: HEMOLYSIS AT THIS LEVEL MAY AFFECT RESULT   Chloride 108 98 - 111 mmol/L   CO2 16 (L) 22 - 32 mmol/L   Glucose, Bld 245 (H) 70 - 99 mg/dL    Comment: Glucose reference range applies only to samples taken after fasting for at least 8 hours.   BUN 32 (H) 8 - 23 mg/dL   Creatinine, Ser 1.42 (H) 0.61 - 1.24 mg/dL   Calcium 9.3 8.9 - 10.3 mg/dL   Total Protein 6.8 6.5 - 8.1 g/dL   Albumin 3.2 (L) 3.5 - 5.0 g/dL   AST 23 15 - 41 U/L    Comment: HEMOLYSIS AT THIS LEVEL MAY AFFECT RESULT   ALT 16 0 - 44 U/L   Alkaline Phosphatase 52 38 - 126 U/L   Total Bilirubin 0.9 0.3 - 1.2 mg/dL    Comment: HEMOLYSIS AT THIS LEVEL MAY AFFECT RESULT   GFR, Estimated 48 (L) >60 mL/min    Comment: (NOTE) Calculated using the CKD-EPI Creatinine Equation (2021)    Anion gap 10 5 - 15    Comment: Performed at [facility], [place], [place] [address]  Troponin I (High Sensitivity)     Status: Abnormal   Collection Time: 11/05/21  3:15 AM  Result Value Ref Range   Troponin I (High Sensitivity) 39 (H) <18  ng/L    Comment: (NOTE) Elevated high sensitivity troponin I (hsTnI) values and significant  changes across serial measurements may suggest ACS but many other  chronic and acute conditions are known to elevate hsTnI results.  Refer to the "Links" section for chest pain algorithms and additional  guidance. Performed at [facility], [address] [place], [place] [address]   Resp Panel by RT-PCR (Flu A&B, Covid) Nasopharyngeal Swab     Status: None   Collection Time: 11/05/21  3:15 AM   Specimen: Nasopharyngeal Swab; Nasopharyngeal(NP) swabs in vial transport medium  Result Value Ref Range   SARS Coronavirus 2 by RT PCR NEGATIVE NEGATIVE  Comment: (NOTE) SARS-CoV-2 target nucleic acids are NOT DETECTED.  The SARS-CoV-2 RNA is generally detectable in upper respiratory specimens during the acute phase of infection. The lowest concentration of SARS-CoV-2 viral copies this assay can detect is 138 copies/mL. A negative result does not preclude SARS-Cov-2 infection and should not be used as the sole basis for treatment or other patient management decisions. A negative result may occur with  improper specimen collection/handling, submission of specimen other than nasopharyngeal swab, presence of viral mutation(s) within the areas targeted by this assay, and inadequate number of viral copies(<138 copies/mL). A negative result must be combined with clinical observations, patient history, and epidemiological information. The expected result is Negative.  Fact Sheet for Patients:  EntrepreneurPulse.com.au  Fact Sheet for Healthcare Providers:  IncredibleEmployment.be  This test is no t yet approved or cleared by the [place] FDA and  has been authorized for detection and/or diagnosis of SARS-CoV-2 by FDA under an Emergency Use Authorization (EUA). This EUA will remain  in effect (meaning this test can be used) for the duration of  the COVID-19 declaration under Section 564(b)(1) of the Act, 21 U.S.C.section 360bbb-3(b)(1), unless the authorization is terminated  or revoked sooner.       Influenza A by PCR NEGATIVE NEGATIVE   Influenza B by PCR NEGATIVE NEGATIVE    Comment: (NOTE) The Xpert Xpress SARS-CoV-2/FLU/RSV plus assay is intended as an aid in the diagnosis of influenza from Nasopharyngeal swab specimens and should not be used as a sole basis for treatment. Nasal washings and aspirates are unacceptable for Xpert Xpress SARS-CoV-2/FLU/RSV testing.  Fact Sheet for Patients: EntrepreneurPulse.com.au  Fact Sheet for Healthcare Providers: IncredibleEmployment.be  This test is not yet approved or cleared by the [place] FDA and has been authorized for detection and/or diagnosis of SARS-CoV-2 by FDA under an Emergency Use Authorization (EUA). This EUA will remain in effect (meaning this test can be used) for the duration of the COVID-19 declaration under Section 564(b)(1) of the Act, 21 U.S.C. section 360bbb-3(b)(1), unless the authorization is terminated or revoked.  Performed at [facility], [address] [place], [place] [address]   Blood gas, venous     Status: Abnormal   Collection Time: 11/05/21  4:43 AM  Result Value Ref Range   pH, Ven 7.37 7.250 - 7.430   pCO2, Ven 30 (L) 44.0 - 60.0 mmHg   pO2, Ven 49.0 (H) 32.0 - 45.0 mmHg   Bicarbonate 17.3 (L) 20.0 - 28.0 mmol/L   Acid-base deficit 6.9 (H) 0.0 - 2.0 mmol/L   O2 Saturation 82.9 %   Patient temperature 37.0    Collection site LINE    Sample type VENOUS     Comment: Performed at [facility], [address], [place], [place] [address]  Troponin I (High Sensitivity)     Status: Abnormal   Collection Time: 11/05/21  5:03 AM  Result Value Ref Range   Troponin I (High Sensitivity) 100 (HH) <18 ng/L    Comment: CRITICAL RESULT CALLED TO, READ BACK BY AND VERIFIED  WITH: BEVERLY, ALICIA 50/09/38 1829 MW (NOTE) Elevated high sensitivity troponin I (hsTnI) values and significant  changes across serial measurements may suggest ACS but many other  chronic and acute conditions are known to elevate hsTnI results.  Refer to the Links section for chest pain algorithms and additional  guidance. Performed at [facility], [address], [place], [place] [address]   Beta-hydroxybutyric acid     Status: None  Collection Time: 11/05/21  5:03 AM  Result Value Ref Range   Beta-Hydroxybutyric Acid 0.11 0.05 - 0.27 mmol/L    Comment: Performed at [facility], [address] [place], [place] [address]  APTT     Status: Abnormal   Collection Time: 11/05/21  7:10 AM  Result Value Ref Range   aPTT 48 (H) 24 - 36 seconds    Comment:        IF BASELINE aPTT IS ELEVATED, SUGGEST PATIENT RISK ASSESSMENT BE USED TO DETERMINE APPROPRIATE ANTICOAGULANT THERAPY. Performed at [facility], [place], [place] [address]   Heparin level (unfractionated)     Status: Abnormal   Collection Time: 11/05/21  7:10 AM  Result Value Ref Range   Heparin Unfractionated >1.10 (H) 0.30 - 0.70 IU/mL    Comment: (NOTE) The clinical reportable range upper limit is being lowered to >1.10 to align with the FDA approved guidance for the current laboratory assay.  If heparin results are below expected values, and patient dosage has  been confirmed, suggest follow up testing of antithrombin III levels. Performed at [facility], [address] [place], [place] [address]   Protime-INR     Status: Abnormal   Collection Time: 11/05/21  7:10 AM  Result Value Ref Range   Prothrombin Time 15.6 (H) 11.4 - 15.2 seconds   INR 1.2 0.8 - 1.2    Comment: (NOTE) INR goal varies based on device and disease states. Performed at [facility], [address] [place], [place] [address]   Troponin I (High Sensitivity)     Status:  Abnormal   Collection Time: 11/05/21  3:43 PM  Result Value Ref Range   Troponin I (High Sensitivity) 1,077 (HH) <18 ng/L    Comment: CRITICAL VALUE NOTED. VALUE IS CONSISTENT WITH PREVIOUSLY REPORTED/CALLED VALUE SS (NOTE) Elevated high sensitivity troponin I (hsTnI) values and significant  changes across serial measurements may suggest ACS but many other  chronic and acute conditions are known to elevate hsTnI results.  Refer to the "Links" section for chest pain algorithms and additional  guidance. Performed at [facility], [address] [place], [place] [address]   APTT     Status: Abnormal   Collection Time: 11/05/21  3:43 PM  Result Value Ref Range   aPTT 147 (H) 24 - 36 seconds    Comment:        IF BASELINE aPTT IS ELEVATED, SUGGEST PATIENT RISK ASSESSMENT BE USED TO DETERMINE APPROPRIATE ANTICOAGULANT THERAPY. Performed at [facility], [address] [place], [place] [address]   CBG monitoring, ED     Status: Abnormal   Collection Time: 11/05/21  4:47 PM  Result Value Ref Range   Glucose-Capillary 238 (H) 70 - 99 mg/dL    Comment: Glucose reference range applies only to samples taken after fasting for at least 8 hours.  Heparin level (unfractionated)     Status: Abnormal   Collection Time: 11/06/21  4:13 AM  Result Value Ref Range   Heparin Unfractionated 0.77 (H) 0.30 - 0.70 IU/mL    Comment: (NOTE) The clinical reportable range upper limit is being lowered to >1.10 to align with the FDA approved guidance for the current laboratory assay.  If heparin results are below expected values, and patient dosage has  been confirmed, suggest follow up testing of antithrombin III levels. Performed at [facility], [address] [place], [place] [address]   CBC  Status: Abnormal   Collection Time: 11/06/21  4:13 AM  Result Value Ref Range   WBC 6.1 4.0 - 10.5 K/uL   RBC 4.42 4.22 - 5.81 MIL/uL   Hemoglobin 12.2 (L) 13.0 - 17.0  g/dL   HCT 37.1 (L) 39.0 - 52.0 %   MCV 83.9 80.0 - 100.0 fL   MCH 27.6 26.0 - 34.0 pg   MCHC 32.9 30.0 - 36.0 g/dL   RDW 16.6 (H) 11.5 - 15.5 %   Platelets 277 150 - 400 K/uL   nRBC 0.0 0.0 - 0.2 %    Comment: Performed at [facility], [address], [place], [place] [address]  Basic metabolic panel     Status: Abnormal   Collection Time: 11/06/21  4:13 AM  Result Value Ref Range   Sodium 138 135 - 145 mmol/L   Potassium 4.2 3.5 - 5.1 mmol/L   Chloride 110 98 - 111 mmol/L   CO2 20 (L) 22 - 32 mmol/L   Glucose, Bld 153 (H) 70 - 99 mg/dL    Comment: Glucose reference range applies only to samples taken after fasting for at least 8 hours.   BUN 26 (H) 8 - 23 mg/dL   Creatinine, Ser 1.00 0.61 - 1.24 mg/dL   Calcium 9.3 8.9 - 10.3 mg/dL   GFR, Estimated >60 >60 mL/min    Comment: (NOTE) Calculated using the CKD-EPI Creatinine Equation (2021)    Anion gap 8 5 - 15    Comment: Performed at [facility], [address] [place], [place] [address]  APTT     Status: Abnormal   Collection Time: 11/06/21  4:13 AM  Result Value Ref Range   aPTT 75 (H) 24 - 36 seconds    Comment:        IF BASELINE aPTT IS ELEVATED, SUGGEST PATIENT RISK ASSESSMENT BE USED TO DETERMINE APPROPRIATE ANTICOAGULANT THERAPY. Performed at [facility], [place], [place] [address]   Troponin I (High Sensitivity)     Status: Abnormal   Collection Time: 11/06/21  4:13 AM  Result Value Ref Range   Troponin I (High Sensitivity) 849 (HH) <18 ng/L    Comment: CRITICAL VALUE NOTED.  VALUE IS CONSISTENT WITH PREVIOUSLY REPORTED AND CALLED VALUE. (NOTE) Elevated high sensitivity troponin I (hsTnI) values and significant  changes across serial measurements may suggest ACS but many other  chronic and acute conditions are known to elevate hsTnI results.  Refer to the Links section for chest pain algorithms and additional  guidance. Performed at [facility],  [address] [place], [place] [address]   CBG monitoring, ED     Status: Abnormal   Collection Time: 11/06/21  8:54 AM  Result Value Ref Range   Glucose-Capillary 136 (H) 70 - 99 mg/dL    Comment: Glucose reference range applies only to samples taken after fasting for at least 8 hours.  APTT     Status: Abnormal   Collection Time: 11/06/21  1:35 PM  Result Value Ref Range   aPTT 88 (H) 24 - 36 seconds    Comment:        IF BASELINE aPTT IS ELEVATED, SUGGEST PATIENT RISK ASSESSMENT BE USED TO DETERMINE APPROPRIATE ANTICOAGULANT THERAPY. Performed at [facility], [address] [place], [place] [address]   CBG monitoring, ED     Status: Abnormal   Collection Time: 11/06/21  2:37 PM  Result Value Ref Range   Glucose-Capillary 178 (H)  70 - 99 mg/dL    Comment: Glucose reference range applies only to samples taken after fasting for at least 8 hours.  Glucose, capillary     Status: Abnormal   Collection Time: 11/06/21  4:57 PM  Result Value Ref Range   Glucose-Capillary 135 (H) 70 - 99 mg/dL    Comment: Glucose reference range applies only to samples taken after fasting for at least 8 hours.  Glucose, capillary     Status: Abnormal   Collection Time: 11/06/21  9:12 PM  Result Value Ref Range   Glucose-Capillary 161 (H) 70 - 99 mg/dL    Comment: Glucose reference range applies only to samples taken after fasting for at least 8 hours.  Gastrointestinal Panel by PCR , Stool     Status: None   Collection Time: 11/07/21  4:02 AM   Specimen: Stool  Result Value Ref Range   Campylobacter species NOT DETECTED NOT DETECTED   Plesimonas shigelloides NOT DETECTED NOT DETECTED   Salmonella species NOT DETECTED NOT DETECTED   Yersinia enterocolitica NOT DETECTED NOT DETECTED   Vibrio species NOT DETECTED NOT DETECTED   Vibrio cholerae NOT DETECTED NOT DETECTED   Enteroaggregative E coli (EAEC) NOT DETECTED NOT DETECTED   Enteropathogenic E coli (EPEC) NOT DETECTED NOT  DETECTED   Enterotoxigenic E coli (ETEC) NOT DETECTED NOT DETECTED   Shiga like toxin producing E coli (STEC) NOT DETECTED NOT DETECTED   Shigella/Enteroinvasive E coli (EIEC) NOT DETECTED NOT DETECTED   Cryptosporidium NOT DETECTED NOT DETECTED   Cyclospora cayetanensis NOT DETECTED NOT DETECTED   Entamoeba histolytica NOT DETECTED NOT DETECTED   Giardia lamblia NOT DETECTED NOT DETECTED   Adenovirus F40/41 NOT DETECTED NOT DETECTED   Astrovirus NOT DETECTED NOT DETECTED   Norovirus GI/GII NOT DETECTED NOT DETECTED   Rotavirus A NOT DETECTED NOT DETECTED   Sapovirus (I, II, IV, and V) NOT DETECTED NOT DETECTED    Comment: Performed at [facility], [address] [place], [place] [address]  C Difficile Quick Screen w PCR reflex     Status: None   Collection Time: 11/07/21  4:02 AM   Specimen: Stool  Result Value Ref Range   C Diff antigen NEGATIVE NEGATIVE   C Diff toxin NEGATIVE NEGATIVE   C Diff interpretation No C. difficile detected.     Comment: Performed at [facility], [address] [place], [place] [address]  Norovirus group 1 & 2 by PCR, stool     Status: None   Collection Time: 11/07/21  4:02 AM  Result Value Ref Range   Norovirus 1 by PCR Negative Negative   Norovirus 2  by PCR Negative Negative    Comment: (NOTE) This test was developed and its performance characteristics determined by [facility].  It has not been cleared or approved by the Food and Drug Administration.  The FDA has determined that such clearance or approval is not necessary. Performed At: [facility] [place], [place] [id number] Rush Farmer MD [id number]   Heparin level (unfractionated)     Status: None   Collection Time: 11/07/21  5:52 AM  Result Value Ref Range   Heparin Unfractionated 0.36 0.30 - 0.70 IU/mL    Comment: (NOTE) The clinical reportable range upper limit is being lowered to >1.10 to align with the FDA approved guidance for the  current laboratory assay.  If heparin results are below expected values, and patient dosage has  been confirmed, suggest follow up testing  of antithrombin III levels. Performed at [facility], [address] [place], [place] [address]   Comprehensive metabolic panel     Status: Abnormal   Collection Time: 11/07/21  5:52 AM  Result Value Ref Range   Sodium 139 135 - 145 mmol/L   Potassium 4.4 3.5 - 5.1 mmol/L   Chloride 111 98 - 111 mmol/L   CO2 21 (L) 22 - 32 mmol/L   Glucose, Bld 146 (H) 70 - 99 mg/dL    Comment: Glucose reference range applies only to samples taken after fasting for at least 8 hours.   BUN 24 (H) 8 - 23 mg/dL   Creatinine, Ser 1.10 0.61 - 1.24 mg/dL   Calcium 9.3 8.9 - 10.3 mg/dL   Total Protein 6.5 6.5 - 8.1 g/dL   Albumin 3.3 (L) 3.5 - 5.0 g/dL   AST 21 15 - 41 U/L   ALT 17 0 - 44 U/L   Alkaline Phosphatase 45 38 - 126 U/L   Total Bilirubin 0.4 0.3 - 1.2 mg/dL   GFR, Estimated >60 >60 mL/min    Comment: (NOTE) Calculated using the CKD-EPI Creatinine Equation (2021)    Anion gap 7 5 - 15    Comment: Performed at [facility], [address] [place], [place] [address]  CBC     Status: Abnormal   Collection Time: 11/07/21  5:52 AM  Result Value Ref Range   WBC 5.1 4.0 - 10.5 K/uL   RBC 3.87 (L) 4.22 - 5.81 MIL/uL   Hemoglobin 10.7 (L) 13.0 - 17.0 g/dL   HCT 32.2 (L) 39.0 - 52.0 %   MCV 83.2 80.0 - 100.0 fL   MCH 27.6 26.0 - 34.0 pg   MCHC 33.2 30.0 - 36.0 g/dL   RDW 16.6 (H) 11.5 - 15.5 %   Platelets 266 150 - 400 K/uL   nRBC 0.0 0.0 - 0.2 %    Comment: Performed at [facility], [address] [place], [place] [address]  Magnesium     Status: None   Collection Time: 11/07/21  5:52 AM  Result Value Ref Range   Magnesium 1.9 1.7 - 2.4 mg/dL    Comment: Performed at [facility], [address] [place], [place] [address]  Phosphorus     Status: None   Collection Time: 11/07/21  5:52 AM  Result Value Ref  Range   Phosphorus 3.5 2.5 - 4.6 mg/dL    Comment: Performed at [facility], [address] [place], [place] [address]  APTT     Status: Abnormal   Collection Time: 11/07/21  5:52 AM  Result Value Ref Range   aPTT 76 (H) 24 - 36 seconds    Comment:        IF BASELINE aPTT IS ELEVATED, SUGGEST PATIENT RISK ASSESSMENT BE USED TO DETERMINE APPROPRIATE ANTICOAGULANT THERAPY. Performed at [facility], [address] [place], [place] [address]   Glucose, capillary     Status: Abnormal   Collection Time: 11/07/21  7:52 AM  Result Value Ref Range   Glucose-Capillary 141 (H) 70 - 99 mg/dL    Comment: Glucose reference range applies only to samples taken after fasting for at least 8 hours.  Glucose, capillary     Status: Abnormal   Collection Time: 11/07/21 11:56 AM  Result Value Ref Range   Glucose-Capillary 271 (H) 70 - 99 mg/dL    Comment: Glucose reference range applies only to samples taken after fasting  for at least 8 hours.  Glucose, capillary     Status: Abnormal   Collection Time: 11/07/21  4:23 PM  Result Value Ref Range   Glucose-Capillary 170 (H) 70 - 99 mg/dL    Comment: Glucose reference range applies only to samples taken after fasting for at least 8 hours.  Glucose, capillary     Status: Abnormal   Collection Time: 11/07/21  8:52 PM  Result Value Ref Range   Glucose-Capillary 156 (H) 70 - 99 mg/dL    Comment: Glucose reference range applies only to samples taken after fasting for at least 8 hours.  Heparin level (unfractionated)     Status: Abnormal   Collection Time: 11/08/21  4:32 AM  Result Value Ref Range   Heparin Unfractionated >1.10 (H) 0.30 - 0.70 IU/mL    Comment: (NOTE) The clinical reportable range upper limit is being lowered to >1.10 to align with the FDA approved guidance for the current laboratory assay.  If heparin results are below expected values, and patient dosage has  been confirmed, suggest follow up testing of  antithrombin III levels. Performed at [facility], [address] [place], [place] [address]   APTT     Status: Abnormal   Collection Time: 11/08/21  4:32 AM  Result Value Ref Range   aPTT 43 (H) 24 - 36 seconds    Comment:        IF BASELINE aPTT IS ELEVATED, SUGGEST PATIENT RISK ASSESSMENT BE USED TO DETERMINE APPROPRIATE ANTICOAGULANT THERAPY. Performed at [facility], [address] [place], [place] [address]   CBC     Status: Abnormal   Collection Time: 11/08/21  4:32 AM  Result Value Ref Range   WBC 5.5 4.0 - 10.5 K/uL   RBC 3.66 (L) 4.22 - 5.81 MIL/uL   Hemoglobin 10.2 (L) 13.0 - 17.0 g/dL   HCT 30.4 (L) 39.0 - 52.0 %   MCV 83.1 80.0 - 100.0 fL   MCH 27.9 26.0 - 34.0 pg   MCHC 33.6 30.0 - 36.0 g/dL   RDW 16.5 (H) 11.5 - 15.5 %   Platelets 244 150 - 400 K/uL   nRBC 0.0 0.0 - 0.2 %    Comment: Performed at [facility], [address] [place], [place] [address]  Basic metabolic panel     Status: Abnormal   Collection Time: 11/08/21  4:32 AM  Result Value Ref Range   Sodium 138 135 - 145 mmol/L   Potassium 3.7 3.5 - 5.1 mmol/L   Chloride 107 98 - 111 mmol/L   CO2 19 (L) 22 - 32 mmol/L   Glucose, Bld 156 (H) 70 - 99 mg/dL    Comment: Glucose reference range applies only to samples taken after fasting for at least 8 hours.   BUN 25 (H) 8 - 23 mg/dL   Creatinine, Ser 0.99 0.61 - 1.24 mg/dL   Calcium 8.8 (L) 8.9 - 10.3 mg/dL   GFR, Estimated >60 >60 mL/min    Comment: (NOTE) Calculated using the CKD-EPI Creatinine Equation (2021)    Anion gap 12 5 - 15    Comment: Performed at [facility], [address] [place], [place] [address]  Urinalysis, Complete w Microscopic     Status: Abnormal   Collection Time: 11/08/21  7:24 AM  Result Value Ref Range   Color, Urine YELLOW (A) YELLOW   APPearance HAZY (A) CLEAR   Specific Gravity, Urine 1.025 1.005 - 1.030   pH 5.0  5.0 - 8.0   Glucose, UA NEGATIVE NEGATIVE mg/dL   Hgb  urine dipstick NEGATIVE NEGATIVE   Bilirubin Urine NEGATIVE NEGATIVE   Ketones, ur 5 (A) NEGATIVE mg/dL   Protein, ur 30 (A) NEGATIVE mg/dL   Nitrite NEGATIVE NEGATIVE   Leukocytes,Ua NEGATIVE NEGATIVE   RBC / HPF 0-5 0 - 5 RBC/hpf   WBC, UA 0-5 0 - 5 WBC/hpf   Bacteria, UA FEW (A) NONE SEEN   Squamous Epithelial / LPF 0-5 0 - 5   Mucus PRESENT    Hyaline Casts, UA PRESENT     Comment: Performed at [facility], [address] [place], [place] [address]  Glucose, capillary     Status: Abnormal   Collection Time: 11/08/21  8:29 AM  Result Value Ref Range   Glucose-Capillary 149 (H) 70 - 99 mg/dL    Comment: Glucose reference range applies only to samples taken after fasting for at least 8 hours.  Glucose, capillary     Status: Abnormal   Collection Time: 11/08/21 11:46 AM  Result Value Ref Range   Glucose-Capillary 179 (H) 70 - 99 mg/dL    Comment: Glucose reference range applies only to samples taken after fasting for at least 8 hours.  Glucose, capillary     Status: Abnormal   Collection Time: 11/08/21  4:46 PM  Result Value Ref Range   Glucose-Capillary 159 (H) 70 - 99 mg/dL    Comment: Glucose reference range applies only to samples taken after fasting for at least 8 hours.  Glucose, capillary     Status: Abnormal   Collection Time: 11/08/21  8:19 PM  Result Value Ref Range   Glucose-Capillary 181 (H) 70 - 99 mg/dL    Comment: Glucose reference range applies only to samples taken after fasting for at least 8 hours.  Glucose, capillary     Status: Abnormal   Collection Time: 11/09/21  8:07 AM  Result Value Ref Range   Glucose-Capillary 124 (H) 70 - 99 mg/dL    Comment: Glucose reference range applies only to samples taken after fasting for at least 8 hours.  Glucose, capillary     Status: Abnormal   Collection Time: 11/09/21 11:19 AM  Result Value Ref Range   Glucose-Capillary 176 (H) 70 - 99 mg/dL    Comment: Glucose reference range applies only to samples  taken after fasting for at least 8 hours.  Clostridium Difficile by PCR     Status: None   Collection Time: 11/19/21  3:00 PM   Specimen: Stool   ST  Result Value Ref Range   Toxigenic C. Difficile by PCR Negative Negative  GI Profile, Stool, PCR     Status: None   Collection Time: 11/19/21  3:00 PM  Result Value Ref Range   Campylobacter Not Detected Not Detected   C difficile toxin A/B Not Detected Not Detected   Plesiomonas shigelloides Not Detected Not Detected   Salmonella Not Detected Not Detected   Vibrio Not Detected Not Detected   Vibrio cholerae Not Detected Not Detected   Yersinia enterocolitica Not Detected Not Detected   Enteroaggregative E coli Not Detected Not Detected   Enteropathogenic E coli Not Detected Not Detected   Enterotoxigenic E coli Not Detected Not Detected   Shiga-toxin-producing E coli Not Detected Not Detected   E coli C585 Not applicable Not Detected   Shigella/Enteroinvasive E coli Not Detected Not Detected   Cryptosporidium Not Detected Not Detected   Cyclospora cayetanensis Not Detected  Not Detected   Entamoeba histolytica Not Detected Not Detected   Giardia lamblia Not Detected Not Detected   Adenovirus F 40/41 Not Detected Not Detected   Astrovirus Not Detected Not Detected   Norovirus GI/GII Not Detected Not Detected   Rotavirus A Not Detected Not Detected   Sapovirus Not Detected Not Detected  Blood gas, venous     Status: Abnormal   Collection Time: 12/04/21  4:06 AM  Result Value Ref Range   FIO2 0.60    Delivery systems BILEVEL POSITIVE AIRWAY PRESSURE     Comment: 14/6   LHR 8 resp/min   pH, Ven 7.32 7.250 - 7.430   pCO2, Ven 39 (L) 44.0 - 60.0 mmHg   pO2, Ven 54.0 (H) 32.0 - 45.0 mmHg   Bicarbonate 20.1 20.0 - 28.0 mmol/L   Acid-base deficit 5.6 (H) 0.0 - 2.0 mmol/L   O2 Saturation 84.6 %   Patient temperature 37.0    Collection site VEIN    Sample type VENOUS     Comment: Performed at [facility], [address] [place], [place] [address]  Comprehensive metabolic panel     Status: Abnormal   Collection Time: 12/04/21  4:13 AM  Result Value Ref Range   Sodium 142 135 - 145 mmol/L   Potassium 3.2 (L) 3.5 - 5.1 mmol/L   Chloride 111 98 - 111 mmol/L   CO2 22 22 - 32 mmol/L   Glucose, Bld 203 (H) 70 - 99 mg/dL    Comment: Glucose reference range applies only to samples taken after fasting for at least 8 hours.   BUN 23 8 - 23 mg/dL   Creatinine, Ser 1.16 0.61 - 1.24 mg/dL   Calcium 9.2 8.9 - 10.3 mg/dL   Total Protein 7.7 6.5 - 8.1 g/dL   Albumin 3.7 3.5 - 5.0 g/dL   AST 23 15 - 41 U/L   ALT 14 0 - 44 U/L   Alkaline Phosphatase 58 38 - 126 U/L   Total Bilirubin 0.7 0.3 - 1.2 mg/dL   GFR, Estimated >60 >60 mL/min    Comment: (NOTE) Calculated using the CKD-EPI Creatinine Equation (2021)    Anion gap 9 5 - 15    Comment: Performed at [facility], [address] [place], [place] [address]  CBC with Differential     Status: Abnormal   Collection Time: 12/04/21  4:13 AM  Result Value Ref Range   WBC 9.5 4.0 - 10.5 K/uL   RBC 4.05 (L) 4.22 - 5.81 MIL/uL   Hemoglobin 11.2 (L) 13.0 - 17.0 g/dL   HCT 34.9 (L) 39.0 - 52.0 %   MCV 86.2 80.0 - 100.0 fL   MCH 27.7 26.0 - 34.0 pg   MCHC 32.1 30.0 - 36.0 g/dL   RDW 16.7 (H) 11.5 - 15.5 %   Platelets 325 150 - 400 K/uL   nRBC 0.2 0.0 - 0.2 %   Neutrophils Relative % 51 %   Neutro Abs 4.8 1.7 - 7.7 K/uL   Lymphocytes Relative 36 %   Lymphs Abs 3.4 0.7 - 4.0 K/uL   Monocytes Relative 9 %   Monocytes Absolute 0.9 0.1 - 1.0 K/uL   Eosinophils Relative 3 %   Eosinophils Absolute 0.3 0.0 - 0.5 K/uL   Basophils Relative 1 %   Basophils Absolute 0.1 0.0 - 0.1 K/uL   Immature Granulocytes 0 %   Abs Immature Granulocytes 0.03 0.00 - 0.07 K/uL    Comment: Performed  at [facility], [place], [place] [address]  Troponin I (High Sensitivity)     Status: Abnormal   Collection Time: 12/04/21  4:13 AM  Result Value Ref  Range   Troponin I (High Sensitivity) 47 (H) <18 ng/L    Comment: (NOTE) Elevated high sensitivity troponin I (hsTnI) values and significant  changes across serial measurements may suggest ACS but many other  chronic and acute conditions are known to elevate hsTnI results.  Refer to the "Links" section for chest pain algorithms and additional  guidance. Performed at [facility], [address] [place], [place] [address]   Resp Panel by RT-PCR (Flu A&B, Covid) Nasopharyngeal Swab     Status: None   Collection Time: 12/04/21  4:16 AM   Specimen: Nasopharyngeal Swab; Nasopharyngeal(NP) swabs in vial transport medium  Result Value Ref Range   SARS Coronavirus 2 by RT PCR NEGATIVE NEGATIVE    Comment: (NOTE) SARS-CoV-2 target nucleic acids are NOT DETECTED.  The SARS-CoV-2 RNA is generally detectable in upper respiratory specimens during the acute phase of infection. The lowest concentration of SARS-CoV-2 viral copies this assay can detect is 138 copies/mL. A negative result does not preclude SARS-Cov-2 infection and should not be used as the sole basis for treatment or other patient management decisions. A negative result may occur with  improper specimen collection/handling, submission of specimen other than nasopharyngeal swab, presence of viral mutation(s) within the areas targeted by this assay, and inadequate number of viral copies(<138 copies/mL). A negative result must be combined with clinical observations, patient history, and epidemiological information. The expected result is Negative.  Fact Sheet for Patients:  EntrepreneurPulse.com.au  Fact Sheet for Healthcare Providers:  IncredibleEmployment.be  This test is no t yet approved or cleared by the [place] FDA and  has been authorized for detection and/or diagnosis of SARS-CoV-2 by FDA under an Emergency Use Authorization (EUA). This EUA will remain  in effect  (meaning this test can be used) for the duration of the COVID-19 declaration under Section 564(b)(1) of the Act, 21 U.S.C.section 360bbb-3(b)(1), unless the authorization is terminated  or revoked sooner.       Influenza A by PCR NEGATIVE NEGATIVE   Influenza B by PCR NEGATIVE NEGATIVE    Comment: (NOTE) The Xpert Xpress SARS-CoV-2/FLU/RSV plus assay is intended as an aid in the diagnosis of influenza from Nasopharyngeal swab specimens and should not be used as a sole basis for treatment. Nasal washings and aspirates are unacceptable for Xpert Xpress SARS-CoV-2/FLU/RSV testing.  Fact Sheet for Patients: EntrepreneurPulse.com.au  Fact Sheet for Healthcare Providers: IncredibleEmployment.be  This test is not yet approved or cleared by the [place] FDA and has been authorized for detection and/or diagnosis of SARS-CoV-2 by FDA under an Emergency Use Authorization (EUA). This EUA will remain in effect (meaning this test can be used) for the duration of the COVID-19 declaration under Section 564(b)(1) of the Act, 21 U.S.C. section 360bbb-3(b)(1), unless the authorization is terminated or revoked.  Performed at [facility], [address] [place], [place] [address]   Brain natriuretic peptide     Status: Abnormal   Collection Time: 12/04/21  4:16 AM  Result Value Ref Range   B Natriuretic Peptide >4,500.0 (H) 0.0 - 100.0 pg/mL    Comment: Performed at [facility], [address] [place], [place] [address]  Procalcitonin - Baseline     Status: None   Collection Time: 12/04/21  5:22 AM  Result  Value Ref Range   Procalcitonin <0.10 ng/mL    Comment:        Interpretation: PCT (Procalcitonin) <= 0.5 ng/mL: Systemic infection (sepsis) is not likely. Local bacterial infection is possible. (NOTE)       Sepsis PCT Algorithm           Lower Respiratory Tract                                      Infection PCT Algorithm     ----------------------------     ----------------------------         PCT < 0.25 ng/mL                PCT < 0.10 ng/mL          Strongly encourage             Strongly discourage   discontinuation of antibiotics    initiation of antibiotics    ----------------------------     -----------------------------       PCT 0.25 - 0.50 ng/mL            PCT 0.10 - 0.25 ng/mL               OR       >80% decrease in PCT            Discourage initiation of                                            antibiotics      Encourage discontinuation           of antibiotics    ----------------------------     -----------------------------         PCT >= 0.50 ng/mL              PCT 0.26 - 0.50 ng/mL               AND        <80% decrease in PCT             Encourage initiation of                                             antibiotics       Encourage continuation           of antibiotics    ----------------------------     -----------------------------        PCT >= 0.50 ng/mL                  PCT > 0.50 ng/mL               AND         increase in PCT                  Strongly encourage                                      initiation of antibiotics    Strongly encourage escalation  of antibiotics                                     -----------------------------                                           PCT <= 0.25 ng/mL                                                 OR                                        > 80% decrease in PCT                                      Discontinue / Do not initiate                                             antibiotics  Performed at [facility], [address] [place], [place] [address]   Lactic acid, plasma     Status: None   Collection Time: 12/04/21  5:22 AM  Result Value Ref Range   Lactic Acid, Venous 1.3 0.5 - 1.9 mmol/L    Comment: Performed at [facility], [address] [place], [place] [address]  Blood culture (routine x  2)     Status: None   Collection Time: 12/04/21  5:22 AM   Specimen: BLOOD  Result Value Ref Range   Specimen Description BLOOD RIGHT ARM    Special Requests      BOTTLES DRAWN AEROBIC AND ANAEROBIC Blood Culture adequate volume   Culture      NO GROWTH 5 DAYS Performed at [facility], [address], [place], [place] [address]    Report Status 12/09/2021 FINAL   Blood culture (routine x 2)     Status: None   Collection Time: 12/04/21  5:50 AM   Specimen: BLOOD  Result Value Ref Range   Specimen Description BLOOD LEFT HAND    Special Requests      BOTTLES DRAWN AEROBIC AND ANAEROBIC Blood Culture adequate volume   Culture      NO GROWTH 5 DAYS Performed at [facility], [address], [place], [place] [address]    Report Status 12/09/2021 FINAL   Lactic acid, plasma     Status: None   Collection Time: 12/04/21  8:06 AM  Result Value Ref Range   Lactic Acid, Venous 1.1 0.5 - 1.9 mmol/L    Comment: Performed at [facility], [address] [place], [place] [address]  Troponin I (High Sensitivity)     Status: Abnormal   Collection Time: 12/04/21  8:06 AM  Result Value Ref Range   Troponin I (High Sensitivity) 238 (HH) <18 ng/L    Comment: CRITICAL RESULT CALLED TO, READ BACK BY AND VERIFIED WITH  LINDSAY BLACK AT 0910 12/04/21 DAS (NOTE) Elevated high sensitivity troponin I (hsTnI) values and significant  changes across serial measurements may suggest ACS but many other  chronic and acute conditions are known to elevate hsTnI results.  Refer to the "Links" section for chest pain algorithms and additional  guidance. Performed at [facility], [address] [place], [place] [address]   Troponin I (High Sensitivity)     Status: Abnormal   Collection Time: 12/04/21 10:36 AM  Result Value Ref Range   Troponin I (High Sensitivity) 314 (HH) <18 ng/L    Comment: CRITICAL VALUE NOTED. VALUE IS CONSISTENT WITH PREVIOUSLY REPORTED/CALLED  VALUE DAS (NOTE) Elevated high sensitivity troponin I (hsTnI) values and significant  changes across serial measurements may suggest ACS but many other  chronic and acute conditions are known to elevate hsTnI results.  Refer to the "Links" section for chest pain algorithms and additional  guidance. Performed at [facility], [place], [place] [address]   Troponin I (High Sensitivity)     Status: Abnormal   Collection Time: 12/04/21 12:46 PM  Result Value Ref Range   Troponin I (High Sensitivity) 694 (HH) <18 ng/L    Comment: CRITICAL VALUE NOTED. VALUE IS CONSISTENT WITH PREVIOUSLY REPORTED/CALLED VALUE SS (NOTE) Elevated high sensitivity troponin I (hsTnI) values and significant  changes across serial measurements may suggest ACS but many other  chronic and acute conditions are known to elevate hsTnI results.  Refer to the "Links" section for chest pain algorithms and additional  guidance. Performed at [facility], [address] [place], [place] [address]   CBG monitoring, ED     Status: Abnormal   Collection Time: 12/04/21 12:46 PM  Result Value Ref Range   Glucose-Capillary 203 (H) 70 - 99 mg/dL    Comment: Glucose reference range applies only to samples taken after fasting for at least 8 hours.  CBG monitoring, ED     Status: Abnormal   Collection Time: 12/04/21  4:29 PM  Result Value Ref Range   Glucose-Capillary 152 (H) 70 - 99 mg/dL    Comment: Glucose reference range applies only to samples taken after fasting for at least 8 hours.  CBG monitoring, ED     Status: Abnormal   Collection Time: 12/04/21  9:14 PM  Result Value Ref Range   Glucose-Capillary 123 (H) 70 - 99 mg/dL    Comment: Glucose reference range applies only to samples taken after fasting for at least 8 hours.  Basic metabolic panel     Status: Abnormal   Collection Time: 12/05/21  7:29 AM  Result Value Ref Range   Sodium 140 135 - 145 mmol/L   Potassium 3.0 (L) 3.5  - 5.1 mmol/L   Chloride 110 98 - 111 mmol/L   CO2 21 (L) 22 - 32 mmol/L   Glucose, Bld 143 (H) 70 - 99 mg/dL    Comment: Glucose reference range applies only to samples taken after fasting for at least 8 hours.   BUN 20 8 - 23 mg/dL   Creatinine, Ser 0.99 0.61 - 1.24 mg/dL   Calcium 8.7 (L) 8.9 - 10.3 mg/dL   GFR, Estimated >60 >60 mL/min    Comment: (NOTE) Calculated using the CKD-EPI Creatinine Equation (2021)    Anion gap 9 5 - 15    Comment: Performed at [facility], [address] [place], [place] [address]  CBC     Status: Abnormal   Collection  Time: 12/05/21  7:29 AM  Result Value Ref Range   WBC 6.4 4.0 - 10.5 K/uL   RBC 3.55 (L) 4.22 - 5.81 MIL/uL   Hemoglobin 10.0 (L) 13.0 - 17.0 g/dL   HCT 30.3 (L) 39.0 - 52.0 %   MCV 85.4 80.0 - 100.0 fL   MCH 28.2 26.0 - 34.0 pg   MCHC 33.0 30.0 - 36.0 g/dL   RDW 16.5 (H) 11.5 - 15.5 %   Platelets 248 150 - 400 K/uL   nRBC 0.0 0.0 - 0.2 %    Comment: Performed at [facility], [address] [place], [place] [address]  Glucose, capillary     Status: Abnormal   Collection Time: 12/05/21  7:55 AM  Result Value Ref Range   Glucose-Capillary 138 (H) 70 - 99 mg/dL    Comment: Glucose reference range applies only to samples taken after fasting for at least 8 hours.  Glucose, capillary     Status: Abnormal   Collection Time: 12/05/21 11:13 AM  Result Value Ref Range   Glucose-Capillary 189 (H) 70 - 99 mg/dL    Comment: Glucose reference range applies only to samples taken after fasting for at least 8 hours.  Glucose, capillary     Status: Abnormal   Collection Time: 12/05/21  4:05 PM  Result Value Ref Range   Glucose-Capillary 128 (H) 70 - 99 mg/dL    Comment: Glucose reference range applies only to samples taken after fasting for at least 8 hours.  Glucose, capillary     Status: Abnormal   Collection Time: 12/05/21  4:45 PM  Result Value Ref Range   Glucose-Capillary 138 (H) 70 - 99 mg/dL    Comment:  Glucose reference range applies only to samples taken after fasting for at least 8 hours.  Glucose, capillary     Status: Abnormal   Collection Time: 12/05/21  7:46 PM  Result Value Ref Range   Glucose-Capillary 112 (H) 70 - 99 mg/dL    Comment: Glucose reference range applies only to samples taken after fasting for at least 8 hours.  Glucose, capillary     Status: Abnormal   Collection Time: 12/06/21  7:28 AM  Result Value Ref Range   Glucose-Capillary 157 (H) 70 - 99 mg/dL    Comment: Glucose reference range applies only to samples taken after fasting for at least 8 hours.  Glucose, capillary     Status: Abnormal   Collection Time: 12/06/21 10:47 AM  Result Value Ref Range   Glucose-Capillary 170 (H) 70 - 99 mg/dL    Comment: Glucose reference range applies only to samples taken after fasting for at least 8 hours.  Potassium     Status: Abnormal   Collection Time: 12/06/21 12:18 PM  Result Value Ref Range   Potassium 2.9 (L) 3.5 - 5.1 mmol/L    Comment: Performed at [facility], [address] [place], [place] [address]  Glucose, capillary     Status: Abnormal   Collection Time: 12/06/21  4:36 PM  Result Value Ref Range   Glucose-Capillary 155 (H) 70 - 99 mg/dL    Comment: Glucose reference range applies only to samples taken after fasting for at least 8 hours.  Glucose, capillary     Status: Abnormal   Collection Time: 12/06/21  8:02 PM  Result Value Ref Range   Glucose-Capillary 137 (H) 70 - 99 mg/dL    Comment: Glucose reference range applies only to samples taken after fasting for  at least 8 hours.  Potassium     Status: Abnormal   Collection Time: 12/07/21  5:29 AM  Result Value Ref Range   Potassium 3.4 (L) 3.5 - 5.1 mmol/L    Comment: Performed at [facility], [address] [place], [place] [address]  Glucose, capillary     Status: Abnormal   Collection Time: 12/07/21  7:39 AM  Result Value Ref Range   Glucose-Capillary 166 (H) 70 - 99 mg/dL     Comment: Glucose reference range applies only to samples taken after fasting for at least 8 hours.  Basic metabolic panel     Status: Abnormal   Collection Time: 12/07/21  9:49 AM  Result Value Ref Range   Sodium 141 135 - 145 mmol/L   Potassium 3.5 3.5 - 5.1 mmol/L   Chloride 107 98 - 111 mmol/L   CO2 27 22 - 32 mmol/L   Glucose, Bld 151 (H) 70 - 99 mg/dL    Comment: Glucose reference range applies only to samples taken after fasting for at least 8 hours.   BUN 22 8 - 23 mg/dL   Creatinine, Ser 0.97 0.61 - 1.24 mg/dL   Calcium 8.9 8.9 - 10.3 mg/dL   GFR, Estimated >60 >60 mL/min    Comment: (NOTE) Calculated using the CKD-EPI Creatinine Equation (2021)    Anion gap 7 5 - 15    Comment: Performed at [facility], [address] [place], [place] [address]  CBC     Status: Abnormal   Collection Time: 12/07/21  9:49 AM  Result Value Ref Range   WBC 5.2 4.0 - 10.5 K/uL   RBC 3.40 (L) 4.22 - 5.81 MIL/uL   Hemoglobin 9.7 (L) 13.0 - 17.0 g/dL   HCT 28.9 (L) 39.0 - 52.0 %   MCV 85.0 80.0 - 100.0 fL   MCH 28.5 26.0 - 34.0 pg   MCHC 33.6 30.0 - 36.0 g/dL   RDW 15.9 (H) 11.5 - 15.5 %   Platelets 234 150 - 400 K/uL   nRBC 0.0 0.0 - 0.2 %    Comment: Performed at [facility], [address] [place], [place] [address]  Troponin I (High Sensitivity)     Status: Abnormal   Collection Time: 12/07/21  9:49 AM  Result Value Ref Range   Troponin I (High Sensitivity) 1,957 (HH) <18 ng/L    Comment: CRITICAL VALUE NOTED. VALUE IS CONSISTENT WITH PREVIOUSLY REPORTED/CALLED VALUE.PMF (NOTE) Elevated high sensitivity troponin I (hsTnI) values and significant  changes across serial measurements may suggest ACS but many other  chronic and acute conditions are known to elevate hsTnI results.  Refer to the "Links" section for chest pain algorithms and additional  guidance. Performed at [facility], [address] [place], [place] [address]   Glucose, capillary      Status: Abnormal   Collection Time: 12/07/21 11:48 AM  Result Value Ref Range   Glucose-Capillary 178 (H) 70 - 99 mg/dL    Comment: Glucose reference range applies only to samples taken after fasting for at least 8 hours.  Glucose, capillary     Status: Abnormal   Collection Time: 12/07/21  4:20 PM  Result Value Ref Range   Glucose-Capillary 236 (H) 70 - 99 mg/dL    Comment: Glucose reference range applies only to samples taken after fasting for at least 8 hours.  Glucose, capillary     Status: Abnormal   Collection Time: 12/07/21  7:43 PM  Result Value Ref Range   Glucose-Capillary 128 (H) 70 - 99 mg/dL    Comment: Glucose reference range applies only to samples taken after fasting for at least 8 hours.  Potassium     Status: None   Collection Time: 12/08/21  4:39 AM  Result Value Ref Range   Potassium 3.9 3.5 - 5.1 mmol/L    Comment: Performed at [facility], [address] [place], [place] [address]  Glucose, capillary     Status: Abnormal   Collection Time: 12/08/21  7:35 AM  Result Value Ref Range   Glucose-Capillary 137 (H) 70 - 99 mg/dL    Comment: Glucose reference range applies only to samples taken after fasting for at least 8 hours.  Resp Panel by RT-PCR (Flu A&B, Covid) Nasopharyngeal Swab     Status: Abnormal   Collection Time: 12/14/21 10:19 PM   Specimen: Nasopharyngeal Swab; Nasopharyngeal(NP) swabs in vial transport medium  Result Value Ref Range   SARS Coronavirus 2 by RT PCR POSITIVE (A) NEGATIVE    Comment: (NOTE) SARS-CoV-2 target nucleic acids are DETECTED.  The SARS-CoV-2 RNA is generally detectable in upper respiratory specimens during the acute phase of infection. Positive results are indicative of the presence of the identified virus, but do not rule out bacterial infection or co-infection with other pathogens not detected by the test. Clinical correlation with patient history and other diagnostic information is necessary to determine  patient infection status. The expected result is Negative.  Fact Sheet for Patients: EntrepreneurPulse.com.au  Fact Sheet for Healthcare Providers: IncredibleEmployment.be  This test is not yet approved or cleared by the [place] FDA and  has been authorized for detection and/or diagnosis of SARS-CoV-2 by FDA under an Emergency Use Authorization (EUA).  This EUA will remain in effect (meaning this test can be used) for the duration of  the COVID-19 declaration under Section 564(b)(1) of the [place] ct, 21 U.S.C. section 360bbb-3(b)(1), unless the authorization is terminated or revoked sooner.     Influenza A by PCR NEGATIVE NEGATIVE   Influenza B by PCR NEGATIVE NEGATIVE    Comment: (NOTE) The Xpert Xpress SARS-CoV-2/FLU/RSV plus assay is intended as an aid in the diagnosis of influenza from Nasopharyngeal swab specimens and should not be used as a sole basis for treatment. Nasal washings and aspirates are unacceptable for Xpert Xpress SARS-CoV-2/FLU/RSV testing.  Fact Sheet for Patients: EntrepreneurPulse.com.au  Fact Sheet for Healthcare Providers: IncredibleEmployment.be  This test is not yet approved or cleared by the [place] FDA and has been authorized for detection and/or diagnosis of SARS-CoV-2 by FDA under an Emergency Use Authorization (EUA). This EUA will remain in effect (meaning this test can be used) for the duration of the COVID-19 declaration under Section 564(b)(1) of the Act, 21 U.S.C. section 360bbb-3(b)(1), unless the authorization is terminated or revoked.  Performed at [facility], [address] [place], [place] [address]   CBC with Differential     Status: Abnormal   Collection Time: 12/14/21 10:39 PM  Result Value Ref Range   WBC 5.8 4.0 - 10.5 K/uL   RBC 3.55 (L) 4.22 - 5.81 MIL/uL   Hemoglobin 10.1 (L) 13.0 - 17.0 g/dL   HCT 30.7 (L) 39.0 - 52.0 %   MCV  86.5 80.0 - 100.0 fL   MCH 28.5 26.0 - 34.0 pg   MCHC 32.9 30.0 - 36.0 g/dL   RDW 15.9 (H) 11.5 - 15.5 %   Platelets 283 150 - 400 K/uL  nRBC 0.0 0.0 - 0.2 %   Neutrophils Relative % 67 %   Neutro Abs 3.9 1.7 - 7.7 K/uL   Lymphocytes Relative 15 %   Lymphs Abs 0.9 0.7 - 4.0 K/uL   Monocytes Relative 15 %   Monocytes Absolute 0.9 0.1 - 1.0 K/uL   Eosinophils Relative 2 %   Eosinophils Absolute 0.1 0.0 - 0.5 K/uL   Basophils Relative 1 %   Basophils Absolute 0.0 0.0 - 0.1 K/uL   Immature Granulocytes 0 %   Abs Immature Granulocytes 0.02 0.00 - 0.07 K/uL    Comment: Performed at [facility], [address] [place], [place] [address]  Comprehensive metabolic panel     Status: Abnormal   Collection Time: 12/14/21 10:39 PM  Result Value Ref Range   Sodium 136 135 - 145 mmol/L   Potassium 4.7 3.5 - 5.1 mmol/L   Chloride 106 98 - 111 mmol/L   CO2 22 22 - 32 mmol/L   Glucose, Bld 124 (H) 70 - 99 mg/dL    Comment: Glucose reference range applies only to samples taken after fasting for at least 8 hours.   BUN 22 8 - 23 mg/dL   Creatinine, Ser 1.31 (H) 0.61 - 1.24 mg/dL   Calcium 9.7 8.9 - 10.3 mg/dL   Total Protein 7.4 6.5 - 8.1 g/dL   Albumin 3.8 3.5 - 5.0 g/dL   AST 24 15 - 41 U/L   ALT 17 0 - 44 U/L   Alkaline Phosphatase 54 38 - 126 U/L   Total Bilirubin 0.9 0.3 - 1.2 mg/dL   GFR, Estimated 52 (L) >60 mL/min    Comment: (NOTE) Calculated using the CKD-EPI Creatinine Equation (2021)    Anion gap 8 5 - 15    Comment: Performed at [facility], [address] [place], [place] [address]   CT CHEST WO CONTRAST  Result Date: 12/04/2021 CLINICAL DATA:  Concern for infection EXAM: CT CHEST WITHOUT CONTRAST TECHNIQUE: Multidetector CT imaging of the chest was performed following the standard protocol without IV contrast. COMPARISON:  Multiple priors, most recent dated October 24, 2021 FINDINGS: Cardiovascular: Mild cardiomegaly. Trace pericardial effusion.  Three-vessel coronary artery calcifications left chest wall dual lead pacer with leads in the right atrium and right ventricle. Mitral annular calcifications. Atherosclerotic disease of the thoracic aorta. Mediastinum/Nodes: Esophagus and thyroid are unremarkable. Stable right upper paratracheal lymph node measuring 1.0 cm on series 2 image 152 no pathologically enlarged lymph nodes seen in the chest. Lungs/Pleura: Central airways are patent. Increased bilateral dependent ground-glass opacities. Right lower lobe and paramediastinal consolidations with associated bronchiectasis, are similar to prior exam, although evaluation is limited due to presence of new pleural effusions and ground-glass opacities. Small right and trace left pleural effusions. Stable calcified nodule the right middle lobe measuring 2.0 cm and stable calcified nodule of the left lingula measuring 2.2 cm. Upper Abdomen: Cholecystectomy clips.  No acute abnormality. Musculoskeletal: No chest wall mass or suspicious bone lesions identified. IMPRESSION: 1. New small right and trace left pleural effusions. Increased bilateral dependent ground-glass opacities with background right lower lobe and right paramediastinal consolidations, findings are favored to represent atelectasis and pulmonary edema superimposed on background post radiation changes. Recommend follow-up after resolution of acute symptoms to better assess post treatment changes. 2. Stable solid partially calcified nodules of the right middle lobe and lingula. 3. Three-vessel coronary artery calcifications. 4. Aortic Atherosclerosis (ICD10-I70.0). Electronically Signed   By: Hosie Poisson.D.  On: 12/04/2021 09:22   CT Angio Chest PE W and/or Wo Contrast  Result Date: 10/24/2021 CLINICAL DATA:  Acute onset shortness of breath. History of lung cancer. EXAM: CT ANGIOGRAPHY CHEST WITH CONTRAST TECHNIQUE: Multidetector CT imaging of the chest was performed using the standard protocol  during bolus administration of intravenous contrast. Multiplanar CT image reconstructions and MIPs were obtained to evaluate the vascular anatomy. CONTRAST:  60mL OMNIPAQUE IOHEXOL 350 MG/ML SOLN COMPARISON:  CT chest dated September 12, 2021. FINDINGS: Cardiovascular: Satisfactory opacification of the pulmonary arteries to the segmental level. No evidence of pulmonary embolism. Unchanged mild cardiomegaly. No pericardial effusion. No thoracic aortic aneurysm. Coronary, aortic arch, and branch vessel atherosclerotic vascular disease. Mediastinum/Nodes: No enlarged mediastinal, hilar, or axillary lymph nodes. Unchanged index subcarinal lymph node measuring 1.1 cm in short axis. Thyroid gland, trachea, and esophagus demonstrate no significant findings. Lungs/Pleura: New patchy ground-glass densities with areas of nodular consolidation in the right upper, right middle, and right lower lobes, with inter- and intralobular septal thickening. Post treatment related dense fibrotic consolidation bronchiectasis in the medial right upper and lower lobes has progressed. New small right and trace left pleural effusions. No pneumothorax. Unchanged calcified scarring in the lingula and medial right middle lobe. Mild dependent subsegmental atelectasis in the posterior left lower lobe. Mild emphysema. New 9 x 8 mm spiculated density in the right upper lobe (series 6, image 22). Upper Abdomen: No acute abnormality. Musculoskeletal: No chest wall abnormality. No acute or significant osseous findings. Review of the MIP images confirms the above findings. IMPRESSION: 1. No evidence of pulmonary embolism. 2. New patchy ground-glass densities with areas of nodular consolidation in the right upper, right middle, and right lower lobes, with inter- and intralobular septal thickening. Differential considerations include multifocal pneumonia, asymmetric pulmonary edema, and pulmonary hemorrhage. 3. New small right and trace left pleural  effusions. 4. New 9 x 8 mm spiculated density in the right upper lobe, presumably related to underlying acute process in the right lung. However, attention on follow-up imaging is recommended. 5. Aortic Atherosclerosis (ICD10-I70.0) and Emphysema (ICD10-J43.9). Electronically Signed   By: Titus Dubin M.D.   On: 10/24/2021 11:14   US RENAL  Result Date: 11/06/2021 CLINICAL DATA:  An area. EXAM: RENAL / URINARY TRACT ULTRASOUND COMPLETE COMPARISON:  February 23, 2014 FINDINGS: Right Kidney: Renal measurements: 11.7 cm x 4.1 cm x 4.2 cm = volume: 106 mL. Diffusely increased echogenicity of the renal parenchyma is noted. No mass or hydronephrosis visualized. Left Kidney: Renal measurements: 12.1 cm x 6.1 cm x 5.5 cm = volume: 212 mL. Echogenicity within normal limits. A 2.7 cm x 1.5 cm x 2.2 cm well-defined, anechoic structure is seen within the lower pole of the left kidney. No abnormal flow is seen within this region on color Doppler evaluation. No hydronephrosis is visualized. Bladder: Appears normal for degree of bladder distention. Other: A mild amount of right-sided perirenal fluid is noted. IMPRESSION: 1. Increased echogenicity of the right kidney and perirenal fluid, which may be secondary to an infectious or inflammatory process such as acute pyelonephritis. Correlation with urinalysis is recommended. 2. Simple left renal cyst. No additional follow-up or imaging is recommended. Electronically Signed   By: Virgina Norfolk M.D.   On: 11/06/2021 16:41   DG Chest Portable 1 View  Result Date: 12/15/2021 CLINICAL DATA:  Cough and shortness of breath with COVID-19 positivity EXAM: PORTABLE CHEST 1 VIEW COMPARISON:  12/04/2021 FINDINGS: Pacing device is again noted. Cardiac shadow is stable. Aortic  calcifications are seen. Lungs are well aerated bilaterally. No focal infiltrate or sizable effusion is seen. Previously seen airspace opacities have resolved in the interval. No bony abnormality is seen.  IMPRESSION: No active disease. Electronically Signed   By: Inez Catalina M.D.   On: 12/15/2021 01:47   DG Chest Portable 1 View  Result Date: 12/04/2021 CLINICAL DATA:  Respiratory distress and chest pain. History of lung cancer. EXAM: PORTABLE CHEST 1 VIEW COMPARISON:  Portable chest 11/05/2021 FINDINGS: The cardiac size is normal. There is patchy calcification throughout the thoracic aorta. Left chest dual lead pacing system and wire insertions are stable. The central vessels are normal in caliber. On the left there are scattered linear scar-like opacities in the lower lung zone but no evidence of focal pneumonia. On the right there is increased upper lobe opacity in the mid perihilar area, small increased right pleural effusion and additional increased opacity in the medial basal right lung, findings concerning for multilobar pneumonia. The left sulci are sharp. IMPRESSION: Increased opacities in the right lung concerning for multilobar pneumonia. Clinical correlation and radiographic follow-up recommended. Small right pleural effusion. Electronically Signed   By: Telford Nab M.D.   On: 12/04/2021 04:44   DG Chest Portable 1 View  Result Date: 11/05/2021 CLINICAL DATA:  Dyspnea EXAM: PORTABLE CHEST 1 VIEW COMPARISON:  10/28/2021 FINDINGS: Lung volumes are small. Developing left basilar atelectasis or infiltrate. Stable subtle infiltrate within the right mid lung zone. No pneumothorax or pleural effusion. Cardiac size is within normal limits. Left subclavian dual lead pacemaker is unchanged. Pulmonary vascularity is normal. No acute bone abnormality. IMPRESSION: Pulmonary hypoinflation. Developing left basilar atelectasis or infiltrate. Stable right mid lung zone subtle pulmonary infiltrate. Electronically Signed   By: Fidela Salisbury M.D.   On: 11/05/2021 03:34   DG Chest Port 1 View  Result Date: 10/28/2021 CLINICAL DATA:  Acute shortness of breath. Hypoxemia. Lung carcinoma. EXAM: PORTABLE CHEST  1 VIEW COMPARISON:  10/25/2021 FINDINGS: The heart size and mediastinal contours are within normal limits. Dual lead transvenous pacemaker remains in appropriate position. Aortic atherosclerotic calcification noted. Low lung volumes are again seen. Infiltrate or atelectasis is again seen in the inferior right upper lobe, abutting the minor fissure, without significant change. No new or increased areas of pulmonary opacity are seen. No evidence of pleural effusion. IMPRESSION: No significant change in right upper lobe infiltrate or atelectasis. Electronically Signed   By: Marlaine Hind M.D.   On: 10/28/2021 09:45   DG Chest Port 1 View  Result Date: 10/25/2021 CLINICAL DATA:  87 year old male with history of shortness of breath and lung cancer with recent abnormal imaging. EXAM: PORTABLE CHEST 1 VIEW COMPARISON:  Chest CT of October 24, 2021 and chest radiograph of October 24, 2021. FINDINGS: LEFT-sided dual lead pacer device, leads project over the cardiac silhouette and power pack over the LEFT chest as before. EKG leads project over the chest. Cardiomediastinal contours are stable. Increased interstitial and alveolar opacities seen more on the RIGHT than the LEFT have diminished in the short interval. Bandlike area of airspace disease in the RIGHT mid chest. No pneumothorax. On limited assessment there is no acute skeletal process. IMPRESSION: Increased interstitial and alveolar opacities seen on the RIGHT than the LEFT have diminished in the short interval. Likely reflecting improving edema and or volume overload. Bandlike area of airspace disease in the RIGHT mid chest likely atelectatic changes. Persistent basilar opacities in the RIGHT medial lower chest related to volume loss and  post treatment changes, superimposed pneumonia would be difficult to exclude. Electronically Signed   By: Zetta Bills M.D.   On: 10/25/2021 11:32   DG Chest Portable 1 View  Result Date: 10/24/2021 CLINICAL DATA:  Shortness  of breath. EXAM: PORTABLE CHEST 1 VIEW COMPARISON:  CT chest dated September 12, 2021. Chest x-ray dated July 03, 2021. FINDINGS: Left chest wall pacemaker again noted. Unchanged mild cardiomegaly. New hazy airspace disease in the right mid and lower lung. Unchanged scarring in the left lung base and post treatment changes in the medial right lower lobe. No pleural effusion or pneumothorax. No acute osseous abnormality. IMPRESSION: 1. New hazy airspace disease in the right mid and lower lung concerning for pneumonia. Electronically Signed   By: Titus Dubin M.D.   On: 10/24/2021 08:54   ECHOCARDIOGRAM COMPLETE  Result Date: 10/25/2021    ECHOCARDIOGRAM REPORT   Patient Name:   XAVIAN HARDCASTLE Date of Exam: 10/25/2021 Medical Rec #:  [id number]      Height:       69.0 in Accession #:    [id number]     Weight:       185.2 lb Date of Birth:  [birth year]       BSA:          2.000 m Patient Age:    28 years       BP:           123/69 mmHg Patient Gender: M              HR:           106 bpm. Exam Location:  [facility] Procedure: 2D Echo, Color Doppler and Cardiac Doppler Indications:     Elevated troponin  History:         Patient has prior history of Echocardiogram examinations, most                  recent 07/24/2021. CHF, CAD, Pacemaker; Risk Factors:Diabetes,                  Hypertension and Dyslipidemia. Hx of radiation therapy.  Sonographer:     Charmayne Sheer Referring Phys:  [id number] AMIN Diagnosing Phys: [name] MD IMPRESSIONS  1. Left ventricular ejection fraction, by estimation, is 20 to 25%. The left ventricle has severely decreased function. The left ventricle demonstrates global hypokinesis. There is mild left ventricular hypertrophy. Left ventricular diastolic parameters  are consistent with Grade II diastolic dysfunction (pseudonormalization).  2. Right ventricular systolic function is normal. The right ventricular size is normal. Tricuspid regurgitation signal is inadequate for assessing PA  pressure.  3. Left atrial size was moderately dilated.  4. The mitral valve is normal in structure. Mild to moderate mitral valve regurgitation. No evidence of mitral stenosis.  5. The aortic valve was not well visualized. Aortic valve regurgitation is not visualized. Mild to moderate aortic valve sclerosis/calcification is present, without any evidence of aortic stenosis.  6. There is borderline dilatation of the aortic root, measuring 37 mm.  7. The inferior vena cava is normal in size with greater than 50% respiratory variability, suggesting right atrial pressure of 3 mmHg. FINDINGS  Left Ventricle: Left ventricular ejection fraction, by estimation, is 20 to 25%. The left ventricle has severely decreased function. The left ventricle demonstrates global hypokinesis. The left ventricular internal cavity size was normal in size. There is mild left ventricular hypertrophy. Left ventricular diastolic parameters are consistent with Grade II diastolic dysfunction (  pseudonormalization). Right Ventricle: The right ventricular size is normal. No increase in right ventricular wall thickness. Right ventricular systolic function is normal. Tricuspid regurgitation signal is inadequate for assessing PA pressure. Left Atrium: Left atrial size was moderately dilated. Right Atrium: Right atrial size was normal in size. Pericardium: There is no evidence of pericardial effusion. Mitral Valve: The mitral valve is normal in structure. There is mild thickening of the mitral valve leaflet(s). Mild to moderate mitral valve regurgitation. No evidence of mitral valve stenosis. MV peak gradient, 11.8 mmHg. The mean mitral valve gradient  is 4.0 mmHg. Tricuspid Valve: The tricuspid valve is normal in structure. Tricuspid valve regurgitation is not demonstrated. No evidence of tricuspid stenosis. Aortic Valve: The aortic valve was not well visualized. Aortic valve regurgitation is not visualized. Mild to moderate aortic valve  sclerosis/calcification is present, without any evidence of aortic stenosis. Aortic valve mean gradient measures 2.0 mmHg.  Aortic valve peak gradient measures 4.8 mmHg. Aortic valve area, by VTI measures 2.19 cm. Pulmonic Valve: The pulmonic valve was normal in structure. Pulmonic valve regurgitation is not visualized. No evidence of pulmonic stenosis. Aorta: The aortic root is normal in size and structure. There is borderline dilatation of the aortic root, measuring 37 mm. Venous: The inferior vena cava is normal in size with greater than 50% respiratory variability, suggesting right atrial pressure of 3 mmHg. IAS/Shunts: No atrial level shunt detected by color flow Doppler. Additional Comments: A device lead is visualized.  LEFT VENTRICLE PLAX 2D LVIDd:         4.70 cm   Diastology LVIDs:         3.46 cm   LV e' medial:    7.29 cm/s LV PW:         1.09 cm   LV E/e' medial:  20.3 LV IVS:        0.89 cm   LV e' lateral:   12.40 cm/s LVOT diam:     2.40 cm   LV E/e' lateral: 11.9 LV SV:         32 LV SV Index:   16 LVOT Area:     4.52 cm  LEFT ATRIUM             Index LA diam:        5.50 cm 2.75 cm/m LA Vol (A2C):   66.1 ml 33.06 ml/m LA Vol (A4C):   41.0 ml 20.50 ml/m LA Biplane Vol: 52.1 ml 26.06 ml/m  AORTIC VALVE                    PULMONIC VALVE AV Area (Vmax):    2.14 cm     PV Vmax:       0.67 m/s AV Area (Vmean):   2.22 cm     PV Vmean:      49.300 cm/s AV Area (VTI):     2.19 cm     PV VTI:        0.094 m AV Vmax:           110.00 cm/s  PV Peak grad:  1.8 mmHg AV Vmean:          65.100 cm/s  PV Mean grad:  1.0 mmHg AV VTI:            0.144 m AV Peak Grad:      4.8 mmHg AV Mean Grad:      2.0 mmHg LVOT Vmax:  52.00 cm/s LVOT Vmean:        31.900 cm/s LVOT VTI:          0.070 m LVOT/AV VTI ratio: 0.48  AORTA Ao Root diam: 3.70 cm MITRAL VALVE MV Area (PHT): 5.58 cm     SHUNTS MV Area VTI:   1.06 cm     Systemic VTI:  0.07 m MV Peak grad:  11.8 mmHg    Systemic Diam: 2.40 cm MV Mean grad:   4.0 mmHg MV Vmax:       1.72 m/s MV Vmean:      89.6 cm/s MV Decel Time: 136 msec MV E velocity: 148.00 cm/s MV A velocity: 51.40 cm/s MV E/A ratio:  2.88 [name] MD Electronically signed by [name] MD Signature Date/Time: 10/25/2021/5:46:45 PM    Final    CUP PACEART REMOTE DEVICE CHECK  Result Date: 10/09/2021 Scheduled remote reviewed. Normal device function.  14 AMS, <10sec PAT 1 PMT appropriately treated without termination Next remote 91 days. LR    Assessment and Plan :   PDMP not reviewed this encounter.  1. Hemoptysis   2. Throat pain     Patient has concerning symptoms including hemoptysis and is high risk patient for an acute cardiopulmonary event including pulmonary embolism, acute on chronic congestive heart failure.  He has also been battling a right-sided pneumonia which if it is worsening would be failing outpatient management.  Unfortunately I am not able to do a chest x-ray on site today.  As such recommended further evaluation than we can provide in the urgent care setting to rule out these emergencies.  Discussed this with his family member who contracts for safety and will take him to the hospital now.   Jaynee Eagles, PA-C 12/12/2021 1348

## 2021-12-20 NOTE — Chronic Care Management (AMB) (Signed)
°  Care Management   Note  11/30/2021 Name: GARET HOOTON MRN: [id number] DOB: January 08, 1931  [name] is a 87 y.o. year old male who is a primary care patient of Jerrol Banana., MD and is actively engaged with the care management team. I reached out to [name] by phone today to assist with scheduling an initial visit with the Licensed Clinical Social Worker  Follow up plan: Telephone appointment with care management team member scheduled for:12/27/21  [place] [facility]  Direct Dial: [phone]

## 2021-12-21 ENCOUNTER — Encounter: Payer: Self-pay | Admitting: Internal Medicine

## 2021-12-21 ENCOUNTER — Inpatient Hospital Stay: Payer: Medicare Other

## 2021-12-21 DIAGNOSIS — J1282 Pneumonia due to coronavirus disease 2019: Secondary | ICD-10-CM

## 2021-12-21 DIAGNOSIS — R1310 Dysphagia, unspecified: Secondary | ICD-10-CM

## 2021-12-21 DIAGNOSIS — J9601 Acute respiratory failure with hypoxia: Secondary | ICD-10-CM | POA: Diagnosis present

## 2021-12-21 DIAGNOSIS — U071 COVID-19: Principal | ICD-10-CM

## 2021-12-21 DIAGNOSIS — J189 Pneumonia, unspecified organism: Secondary | ICD-10-CM | POA: Diagnosis present

## 2021-12-21 DIAGNOSIS — E119 Type 2 diabetes mellitus without complications: Secondary | ICD-10-CM | POA: Diagnosis not present

## 2021-12-21 LAB — BLOOD GAS, VENOUS
Acid-base deficit: 9.9 mmol/L — ABNORMAL HIGH (ref 0.0–2.0)
Bicarbonate: 14.6 mmol/L — ABNORMAL LOW (ref 20.0–28.0)
O2 Saturation: 78.6 %
Patient temperature: 37
pCO2, Ven: 27 mmHg — ABNORMAL LOW (ref 44.0–60.0)
pH, Ven: 7.34 (ref 7.250–7.430)
pO2, Ven: 46 mmHg — ABNORMAL HIGH (ref 32.0–45.0)

## 2021-12-21 LAB — CBG MONITORING, ED
Glucose-Capillary: 106 mg/dL — ABNORMAL HIGH (ref 70–99)
Glucose-Capillary: 162 mg/dL — ABNORMAL HIGH (ref 70–99)
Glucose-Capillary: 205 mg/dL — ABNORMAL HIGH (ref 70–99)
Glucose-Capillary: 193 mg/dL — ABNORMAL HIGH (ref 70–99)

## 2021-12-21 LAB — COMPREHENSIVE METABOLIC PANEL
ALT: 13 U/L (ref 0–44)
AST: 24 U/L (ref 15–41)
Albumin: 3.1 g/dL — ABNORMAL LOW (ref 3.5–5.0)
Alkaline Phosphatase: 54 U/L (ref 38–126)
Anion gap: 11 (ref 5–15)
BUN: 20 mg/dL (ref 8–23)
CO2: 14 mmol/L — ABNORMAL LOW (ref 22–32)
Calcium: 8.9 mg/dL (ref 8.9–10.3)
Chloride: 113 mmol/L — ABNORMAL HIGH (ref 98–111)
Creatinine, Ser: 1.01 mg/dL (ref 0.61–1.24)
GFR, Estimated: >60 mL/min (ref >60)
Glucose, Bld: 143 mg/dL — ABNORMAL HIGH (ref 70–99)
Potassium: 3.6 mmol/L (ref 3.5–5.1)
Sodium: 138 mmol/L (ref 135–145)
Total Bilirubin: 0.8 mg/dL (ref 0.3–1.2)
Total Protein: 6.8 g/dL (ref 6.5–8.1)

## 2021-12-21 LAB — BLOOD GAS, ARTERIAL
Acid-base deficit: 11.6 mmol/L — ABNORMAL HIGH (ref 0.0–2.0)
Bicarbonate: 11.5 mmol/L — ABNORMAL LOW (ref 20.0–28.0)
FIO2: 0.21
O2 Saturation: 95.4 %
Patient temperature: 37
pCO2 arterial: 19 mmHg — CL (ref 32.0–48.0)
pH, Arterial: 7.39 (ref 7.350–7.450)
pO2, Arterial: 79 mmHg — ABNORMAL LOW (ref 83.0–108.0)

## 2021-12-21 LAB — PROTIME-INR
INR: 1.3 — ABNORMAL HIGH (ref 0.8–1.2)
Prothrombin Time: 16.2 seconds — ABNORMAL HIGH (ref 11.4–15.2)

## 2021-12-21 LAB — D-DIMER, QUANTITATIVE: D-Dimer, Quant: 1.52 ug/mL-FEU — ABNORMAL HIGH (ref 0.00–0.50)

## 2021-12-21 LAB — APTT: aPTT: 47 seconds — ABNORMAL HIGH (ref 24–36)

## 2021-12-21 LAB — TROPONIN I (HIGH SENSITIVITY)
Troponin I (High Sensitivity): 38 ng/L — ABNORMAL HIGH (ref <18)
Troponin I (High Sensitivity): 40 ng/L — ABNORMAL HIGH (ref <18)
Troponin I (High Sensitivity): 90 ng/L — ABNORMAL HIGH (ref <18)

## 2021-12-21 LAB — HEPARIN LEVEL (UNFRACTIONATED): Heparin Unfractionated: 0.44 IU/mL (ref 0.30–0.70)

## 2021-12-21 LAB — PROCALCITONIN: Procalcitonin: <0.1 ng/mL

## 2021-12-21 MED ORDER — IPRATROPIUM-ALBUTEROL 0.5-2.5 (3) MG/3ML IN SOLN: 3.0000 mL | RESPIRATORY_TRACT | Status: DC | PRN

## 2021-12-21 MED ORDER — SACUBITRIL-VALSARTAN 49-51 MG PO TABS
1.0000 | ORAL_TABLET | Freq: Two times a day (BID) | ORAL | Status: DC
  Administered 2021-12-21 (×2): 1 via ORAL
  Filled 2021-12-21 (×5): qty 1

## 2021-12-21 MED ORDER — INSULIN ASPART 100 UNIT/ML IJ SOLN
0.0000 [IU] | INTRAMUSCULAR | Status: DC
  Administered 2021-12-21: 08:00:00 2 [IU] via SUBCUTANEOUS
  Filled 2021-12-21: qty 1

## 2021-12-21 MED ORDER — ALBUTEROL SULFATE HFA 108 (90 BASE) MCG/ACT IN AERS
2.0000 | INHALATION_SPRAY | RESPIRATORY_TRACT | Status: DC | PRN
  Filled 2021-12-21: qty 6.7

## 2021-12-21 MED ORDER — APIXABAN 5 MG PO TABS
5.0000 mg | ORAL_TABLET | Freq: Two times a day (BID) | ORAL | Status: DC
  Administered 2021-12-21 (×2): 5 mg via ORAL
  Filled 2021-12-21 (×2): qty 1

## 2021-12-21 MED ORDER — FUROSEMIDE 10 MG/ML IJ SOLN: 20.0000 mg | Freq: Once | INTRAMUSCULAR | Status: DC

## 2021-12-21 MED ORDER — SODIUM CHLORIDE 0.9 % IV SOLN
100.0000 mg | Freq: Two times a day (BID) | INTRAVENOUS | Status: DC
  Administered 2021-12-21 – 2021-12-22 (×3): 100 mg via INTRAVENOUS
  Filled 2021-12-21 (×4): qty 100

## 2021-12-21 MED ORDER — METHYLPREDNISOLONE SODIUM SUCC 40 MG IJ SOLR
40.0000 mg | Freq: Two times a day (BID) | INTRAMUSCULAR | Status: DC
  Administered 2021-12-21 – 2021-12-22 (×3): 40 mg via INTRAVENOUS
  Filled 2021-12-21 (×3): qty 1

## 2021-12-21 MED ORDER — ACETAMINOPHEN 325 MG PO TABS: 650.0000 mg | ORAL_TABLET | Freq: Four times a day (QID) | ORAL | Status: DC | PRN

## 2021-12-21 MED ORDER — FLUTICASONE-UMECLIDIN-VILANT 100-62.5-25 MCG/ACT IN AEPB: 1.0000 | INHALATION_SPRAY | Freq: Every day | RESPIRATORY_TRACT | Status: DC

## 2021-12-21 MED ORDER — LABETALOL HCL 5 MG/ML IV SOLN: 10.0000 mg | INTRAVENOUS | Status: DC | PRN

## 2021-12-21 MED ORDER — SODIUM CHLORIDE 0.9 % IV SOLN: 500.0000 mg | INTRAVENOUS | Status: DC

## 2021-12-21 MED ORDER — SODIUM CHLORIDE 0.9 % IV SOLN: 100.0000 mg | Freq: Every day | INTRAVENOUS | Status: DC

## 2021-12-21 MED ORDER — SPIRONOLACTONE 25 MG PO TABS
12.5000 mg | ORAL_TABLET | Freq: Every day | ORAL | Status: DC
  Administered 2021-12-21: 10:00:00 12.5 mg via ORAL
  Filled 2021-12-21: qty 0.5
  Filled 2021-12-21 (×2): qty 1
  Filled 2021-12-21: qty 0.5

## 2021-12-21 MED ORDER — HEPARIN BOLUS VIA INFUSION
4000.0000 [IU] | Freq: Once | INTRAVENOUS | Status: AC
  Administered 2021-12-21: 04:00:00 4000 [IU] via INTRAVENOUS
  Filled 2021-12-21: qty 4000

## 2021-12-21 MED ORDER — FUROSEMIDE 10 MG/ML IJ SOLN
20.0000 mg | Freq: Once | INTRAMUSCULAR | Status: AC
  Administered 2021-12-21: 05:00:00 20 mg via INTRAVENOUS
  Filled 2021-12-21: qty 4

## 2021-12-21 MED ORDER — METOPROLOL SUCCINATE ER 50 MG PO TB24
25.0000 mg | ORAL_TABLET | Freq: Every day | ORAL | Status: DC
  Administered 2021-12-21: 08:00:00 25 mg via ORAL
  Filled 2021-12-21: qty 1

## 2021-12-21 MED ORDER — SODIUM CHLORIDE 0.9 % IV SOLN
200.0000 mg | Freq: Once | INTRAVENOUS | Status: DC
  Administered 2021-12-21: 07:00:00 200 mg via INTRAVENOUS
  Filled 2021-12-21: qty 40

## 2021-12-21 MED ORDER — ALBUTEROL SULFATE (2.5 MG/3ML) 0.083% IN NEBU
2.5000 mg | INHALATION_SOLUTION | Freq: Once | RESPIRATORY_TRACT | Status: AC
  Administered 2021-12-21: 03:00:00 2.5 mg via RESPIRATORY_TRACT
  Filled 2021-12-21: qty 3

## 2021-12-21 MED ORDER — SERTRALINE HCL 50 MG PO TABS
50.0000 mg | ORAL_TABLET | Freq: Every day | ORAL | Status: DC
  Administered 2021-12-21: 10:00:00 50 mg via ORAL
  Filled 2021-12-21: qty 1

## 2021-12-21 MED ORDER — SODIUM CHLORIDE 0.9 % IV SOLN
2.0000 g | INTRAVENOUS | Status: DC
  Administered 2021-12-21: 08:00:00 2 g via INTRAVENOUS
  Filled 2021-12-21: qty 20

## 2021-12-21 MED ORDER — ISOSORBIDE MONONITRATE ER 60 MG PO TB24
60.0000 mg | ORAL_TABLET | Freq: Every day | ORAL | Status: DC
  Administered 2021-12-21: 10:00:00 60 mg via ORAL
  Filled 2021-12-21: qty 1

## 2021-12-21 MED ORDER — ACETAMINOPHEN 650 MG RE SUPP: 650.0000 mg | Freq: Four times a day (QID) | RECTAL | Status: DC | PRN

## 2021-12-21 MED ORDER — INSULIN ASPART 100 UNIT/ML IJ SOLN
0.0000 [IU] | Freq: Three times a day (TID) | INTRAMUSCULAR | Status: DC
  Administered 2021-12-21: 18:00:00 2 [IU] via SUBCUTANEOUS
  Administered 2021-12-21: 13:00:00 3 [IU] via SUBCUTANEOUS
  Filled 2021-12-21 (×2): qty 1

## 2021-12-21 MED ORDER — FLUTICASONE FUROATE-VILANTEROL 100-25 MCG/ACT IN AEPB
1.0000 | INHALATION_SPRAY | Freq: Every day | RESPIRATORY_TRACT | Status: DC
  Administered 2021-12-21: 11:00:00 1 via RESPIRATORY_TRACT
  Filled 2021-12-21: qty 28

## 2021-12-21 MED ORDER — HEPARIN (PORCINE) 25000 UT/250ML-% IV SOLN
1100.0000 [IU]/h | INTRAVENOUS | Status: DC
  Administered 2021-12-21: 04:00:00 1100 [IU]/h via INTRAVENOUS
  Filled 2021-12-21: qty 250

## 2021-12-21 MED ORDER — UMECLIDINIUM BROMIDE 62.5 MCG/ACT IN AEPB
1.0000 | INHALATION_SPRAY | Freq: Every day | RESPIRATORY_TRACT | Status: DC
  Administered 2021-12-21: 11:00:00 1 via RESPIRATORY_TRACT
  Filled 2021-12-21: qty 7

## 2021-12-21 NOTE — ED Notes (Addendum)
Wife came out of room asking for help. Patient having difficulty breathing. Rapid respiration of 36. Audible wheezes and wet. Dr. Beather Arbour briefly saw patient, ordered neb and updated inpatient team. Tachycardiac. Neb given at [employer]. Reassessment at 0330 work of breathing slightly improved. RR in upper 20s.

## 2021-12-21 NOTE — H&P (Addendum)
History and Physical    Howard SIMINGTON [name]:[id number] DOB: 07/30/1931 DOA: 12/11/2021  PCP: Jerrol Banana., MD  Patient coming from: Home.  Chief Complaint: Cough and pain on swallowing.  HPI: Howard [name] is a 87 y.o. male with history of nonobstructive CAD, chronic systolic heart failure last EF measured was 20 to 25%, splenic infarct on Eliquis, diabetes mellitus type 2 last hemoglobin A1c was 8.2, dementia, complete heart block status post pacemaker placement, history of non-small cell lung cancer status post radiation was recently admitted for CHF exacerbation discharged about 2 weeks ago was diagnosed with COVID infection after patient exposed to [place] recently after discharge was having persistent cough over the last 6 days and over the last 24 hours has been having increasing pain and difficulty to swallow because of the pain.  Pain is mostly in the left side of the throat.  Patient has chronic hemoptysis which has gradually worsened over the last few days.  Patient also has benign chronic diarrhea takes Lomotil and Xifaxan.  Patient recently completed a course of Paxlovid for COVID-19 infection last dose was yesterday.  ED Course: In the ER patient had CT neck soft tissue which did not show anything acute but x-ray shows pneumonia.  COVID test is positive.  Patient is presently on 2 L oxygen at the time of my exam.  Patient was empirically started on antibiotics for community-acquired pneumonia.  I have ordered CT chest which shows multifocal pneumonia.  Hemoglobin is around 10.5 sensitivity troponin is 21.  EKG shows paced rhythm.  Review of Systems: As per HPI, rest all negative.   Past Medical History:  Diagnosis Date   Arthritis    Bell palsy    Bell's palsy 04/12/2015   Cancer [facility])    prostate and skin   Chronic combined systolic and diastolic CHF, NYHA class 1 ([place])    a. 07/2014 Echo: EF 35-40%, Gr 1 DD; b. 03/2021 Echo: EF 20-25%; c. 10/2021 Echo: EF 20-25%, glob  HK, GrII DD, nl RV fxn, mod LAE, mild to mod MR, mild-mod AoV scelrosis, Ao root 77mm.   Complete heart block ([place])    a. 11/2010 s/p SJM 2210 [facility] DC PPM, ser# [id number].   Depression    Diabetes mellitus without complication [facility])    Fall 11/10/2014   GERD (gastroesophageal reflux disease)    History of prostate cancer    Hyperlipidemia    Hypertension    LBBB (left bundle branch block)    Left-sided Bell's palsy    Lung cancer ([place]) 2016   NICM (nonischemic cardiomyopathy) ([place])    a. 07/2014 Echo: EF 35-40%, Gr 1 DD; b. 03/2021 Echo: EF 20-25%; c. 03/2021 Cath:  d. 10/2021 Echo: EF 20-25%, glob HK, GrII DD.   Non-obstructive CAD    a. 07/2014 Abnl MV;  b. 08/2014 Cath: LM nl, LAD 30p, RI 40p, LCX nl, OM1 40, RCA dominant 30p, 70d-->Med Rx; c. 03/2021 Cath: LM nl, LAD 20p/m, LCX 30ost/p, OM1 40, OM2 60, RCA 30p, 50d. RHC w/ minimally elevated filling pressures.   Poor balance    Presence of permanent cardiac pacemaker    Sleep apnea    a. cpap   Vertigo    WPW (Wolff-Parkinson-White syndrome)    a. S/P RFCA 1991.    Past Surgical History:  Procedure Laterality Date   ABDOMINAL AORTIC ENDOVASCULAR STENT GRAFT  08/25/2019   Procedure: ABDOMINAL AORTIC ENDOVASCULAR STENT GRAFT;  Surgeon: Algernon Huxley, MD;  Location:  [place] [facility];  Service: Vascular;;   ANGIOPLASTY Left 08/25/2019   Procedure: ANGIOPLASTY;  Surgeon: Algernon Huxley, MD;  Location: [facility];  Service: Vascular;  Laterality: Left;  left SFA and stent placement   APPLICATION OF WOUND VAC Left 06/07/2015   Procedure: APPLICATION OF WOUND VAC;  Surgeon: Robert Bellow, MD;  Location: [facility];  Service: General;  Laterality: Left;  left upper back   Wright  08/26/2014   Single vessel obstructive CAD   CARPAL TUNNEL RELEASE  04-04-15   [facility]   CATARACT EXTRACTION  07-31-11 and 09-18-11   Catheter ablation  1991   for WPW   cervical fusion     CHOLECYSTECTOMY  09-07-14   ENDARTERECTOMY FEMORAL  Left 08/25/2019   Procedure: ENDARTERECTOMY FEMORAL;  Surgeon: Algernon Huxley, MD;  Location: [facility];  Service: Vascular;  Laterality: Left;  common and produndis    ENDOVASCULAR REPAIR/STENT GRAFT Right 08/25/2019   Procedure: ENDOVASCULAR REPAIR/STENT GRAFT;  Surgeon: Algernon Huxley, MD;  Location: [facility];  Service: Vascular;  Laterality: Right;  renal artery   HAND SURGERY     right 1993; left 2005   [place] / REPLACE / REMOVE PACEMAKER     INSERTION OF ILIAC STENT Bilateral 08/25/2019   Procedure: INSERTION OF ILIAC STENT;  Surgeon: Algernon Huxley, MD;  Location: [facility];  Service: Vascular;  Laterality: Bilateral;   JOINT REPLACEMENT Left 2013   knee   JOINT REPLACEMENT Right 2004   knee   KNEE SURGERY     left knee 1991 and 1992; right knee [place] N/A 08/26/2014   Procedure: LEFT HEART CATHETERIZATION WITH CORONARY ANGIOGRAM;  Surgeon: [name], MD;  Location: [facility] CATH LAB;  Service: Cardiovascular;  Laterality: N/A;   LOWER EXTREMITY ANGIOGRAPHY Left 08/23/2019   Procedure: Lower Extremity Angiography;  Surgeon: Algernon Huxley, MD;  Location: [place] CV LAB;  Service: Cardiovascular;  Laterality: Left;   LUMBAR LAMINECTOMY/DECOMPRESSION MICRODISCECTOMY N/A 06/07/2014   Procedure: LUMBAR FOUR TO FIVE LUMBAR LAMINECTOMY/DECOMPRESSION MICRODISCECTOMY 1 LEVEL;  Surgeon: [name], MD;  Location: [place] NEURO [facility];  Service: Neurosurgery;  Laterality: N/A;   LUNG BIOPSY Right 2016   Dr [name]   MOHS SURGERY     PACEMAKER INSERTION     PPM-- St Jude 11/30/10 by [name]   PPM GENERATOR CHANGEOUT N/A 07/09/2019   Procedure: PPM GENERATOR CHANGEOUT;  Surgeon: Deboraha Sprang, MD;  Location: [place] CV LAB;  Service: Cardiovascular;  Laterality: N/A;   PROSTATE SURGERY     cancer--1998, prostatectomy   REPLACEMENT TOTAL KNEE     2004   RIGHT/LEFT HEART CATH AND CORONARY ANGIOGRAPHY Bilateral 04/13/2021   Procedure:  RIGHT/LEFT HEART CATH AND CORONARY ANGIOGRAPHY;  Surgeon: Wellington Hampshire, MD;  Location: [place] CV LAB;  Service: Cardiovascular;  Laterality: Bilateral;   ruptured disc     1962 and 1998   TEE WITHOUT CARDIOVERSION N/A 09/01/2019   Procedure: TRANSESOPHAGEAL ECHOCARDIOGRAM (TEE);  Surgeon: [name], MD;  Location: [facility];  Service: Cardiovascular;  Laterality: N/A;   TEMPORARY PACEMAKER N/A 07/09/2019   Procedure: TEMPORARY PACEMAKER;  Surgeon: Deboraha Sprang, MD;  Location: [place] CV LAB;  Service: Cardiovascular;  Laterality: N/A;   TRIGGER FINGER RELEASE  01-24-15   WOUND DEBRIDEMENT Left 06/07/2015   Procedure: DEBRIDEMENT WOUND;  Surgeon:  Robert Bellow, MD;  Location: [facility];  Service: General;  Laterality: Left;  left upper back     reports that he quit smoking about 12 years ago. His smoking use included cigarettes. He has a 56.00 pack-year smoking history. He has never used smokeless tobacco. He reports that he does not drink alcohol and does not use drugs.  Allergies  Allergen Reactions   Sulfa Antibiotics Rash    Family History  Problem Relation Age of Onset   Heart attack Mother    Hyperlipidemia Mother    CAD Other    Prostate cancer Neg Hx    Cancer Neg Hx     Prior to Admission medications   Medication Sig Start Date End Date Taking? Authorizing Provider  acetaminophen (TYLENOL) 500 MG tablet Take 1,000 mg by mouth every 6 (six) hours as needed for moderate pain.   Yes [provider]  albuterol (VENTOLIN HFA) 108 (90 Base) MCG/ACT inhaler INHALE 2 PUFFS INTO LUNGS EVERY 6 HOURS AS NEEDED FOR WHEEZING OR SHORTNESS OF BREATH 07/23/21  Yes Tyler Pita, MD  apixaban (ELIQUIS) 5 MG TABS tablet Take 5 mg by mouth 2 (two) times daily.   Yes [provider]  aspirin EC 81 MG tablet Take 81 mg by mouth daily.   Yes [provider]  cetirizine (ZYRTEC) 10 MG tablet Take 10 mg by mouth daily.   Yes [provider]  Cholecalciferol (VITAMIN D3) 1000 UNITS CAPS Take 1,000 Units by mouth daily.   Yes [provider]  diphenoxylate-atropine (LOMOTIL) 2.5-0.025 MG tablet Take 1 to 2 tablet by mouth daily as needed for diarrhea 11/22/21  Yes Lucilla Lame, MD  Fluticasone-Umeclidin-Vilant (TRELEGY ELLIPTA) 100-62.5-25 MCG/INH AEPB Inhale 1 puff into the lungs daily. 02/14/21  Yes Tyler Pita, MD  furosemide (LASIX) 40 MG tablet Take 1 tablet (40 mg total) by mouth daily. 12/09/21  Yes Fritzi Mandes, MD  glipiZIDE (GLUCOTROL XL) 5 MG 24 hr tablet Take 1 tablet (5 mg total) by mouth daily with breakfast. 12/03/21  Yes Birdie Sons, MD  Iron-Vitamin C (VITRON-C) 65-125 MG TABS Take 1 tablet by mouth daily. 04/16/21  Yes Earlie Server, MD  isosorbide mononitrate (IMDUR) 60 MG 24 hr tablet Take 1 tablet (60 mg total) by mouth daily. 12/11/21  Yes [name], MD  megestrol (MEGACE) 40 MG tablet Take 1 tablet (40 mg total) by mouth 2 (two) times daily. 09/18/21  Yes Earlie Server, MD  metoprolol succinate (TOPROL-XL) 25 MG 24 hr tablet Take 1 tablet (25 mg total) by mouth at bedtime. 12/11/21  Yes Gollan, Kathlene November, MD  montelukast (SINGULAIR) 10 MG tablet Take 10 mg by mouth daily as needed (sneezing/allergies.).   Yes [provider]  Multiple Vitamin (MULTIVITAMIN WITH MINERALS) TABS tablet Take 1 tablet by mouth daily. One-A-Day Multivitamin   Yes [provider]  omeprazole (PRILOSEC) 40 MG capsule TAKE 1 CAPSULE TWICE DAILY AS NEEDED Patient taking differently: Take 40 mg by mouth daily. 02/12/21  Yes Chrismon, [name], PA-C  ondansetron (ZOFRAN-ODT) 4 MG disintegrating tablet Take 1 tablet (4 mg total) by mouth every 8 (eight) hours as needed for nausea or vomiting. 12/15/21  Yes Carrie Mew, MD  potassium chloride (KLOR-CON) 10 MEQ tablet Take 2 tablets (20 mEq total) by mouth daily. 12/11/21 03/11/22 Yes Gollan, Kathlene November, MD  rifaximin (XIFAXAN) 550 MG TABS tablet Take 1 tablet  (550 mg total) by mouth 3 (three) times daily. 12/03/21  Yes Birdie Sons, MD  sacubitril-valsartan (ENTRESTO) 49-51 MG Take 1 tablet by mouth 2 (two) times daily. 12/08/21  Yes Fritzi Mandes, MD  sertraline (ZOLOFT) 50 MG tablet Take 1 tablet (50 mg total) by mouth daily. 10/23/21  Yes Jerrol Banana., MD  simvastatin (ZOCOR) 40 MG tablet Take 1 tablet (40 mg total) by mouth at bedtime. 12/11/21  Yes [name], MD  spironolactone (ALDACTONE) 25 MG tablet Take 0.5 tablets (12.5 mg total) by mouth daily. 12/11/21 12/06/22 Yes Gollan, Kathlene November, MD  nitroGLYCERIN (NITROSTAT) 0.4 MG SL tablet PLACE 1 TABLET UNDER TONGUE EVERY 5 MIN AS NEEDED FOR CHEST PAIN IF NO RELIEF IN15 MIN CALL 911 (MAX 2 TABS) 11/19/21   Gollan, Kathlene November, MD    Physical Exam: Constitutional: Moderately built and nourished. Vitals:   12/21/21 0145 12/21/21 0200 12/21/21 0215 12/21/21 0230  BP:  (!) 152/83  (!) 147/90  Pulse: (!) 102 (!) 101 (!) 102 (!) 108  Resp: 15     Temp:      TempSrc:      SpO2: 93% 94% 93% 95%  Weight:      Height:       Eyes: Anicteric no pallor. ENMT: No discharge from the ears eyes nose and mouth. Neck: No obvious stridor. Respiratory: No rhonchi or crepitations. Cardiovascular: S1-S2 heard. Abdomen: Soft nontender bowel sound present. Musculoskeletal: No edema. Skin: No rash. Neurologic: Alert awake oriented to name and place moving all extremities. Psychiatric: Oriented to his name and place.   Labs on Admission: I have personally reviewed following labs and imaging studies  CBC: Recent Labs  Lab 12/14/21 2239 11/27/2021 1714  WBC 5.8 5.5  NEUTROABS 3.9 3.6  HGB 10.1* 10.5*  HCT 30.7* 34.0*  MCV 86.5 88.3  PLT 283 671   Basic Metabolic Panel: Recent Labs  Lab 12/14/21 2239 12/09/2021 1714  NA 136 138  K 4.7 3.8  CL 106 112*  CO2 22 15*  GLUCOSE 124* 138*  BUN 22 22  CREATININE 1.31* 1.11  CALCIUM 9.7 9.2   GFR: Estimated Creatinine Clearance: 46  mL/min (by C-G formula based on SCr of 1.11 mg/dL). Liver Function Tests: Recent Labs  Lab 12/14/21 2239 12/12/2021 1714  AST 24 26  ALT 17 14  ALKPHOS 54 58  BILITOT 0.9 0.8  PROT 7.4 7.6  ALBUMIN 3.8 3.4*   No results for input(s): LIPASE, AMYLASE in the last 168 hours. No results for input(s): AMMONIA in the last 168 hours. Coagulation Profile: No results for input(s): INR, PROTIME in the last 168 hours. Cardiac Enzymes: No results for input(s): CKTOTAL, CKMB, CKMBINDEX, TROPONINI in the last 168 hours. BNP (last 3 results) No results for input(s): PROBNP in the last 8760 hours. HbA1C: No results for input(s): HGBA1C in the last 72 hours. CBG: No results for input(s): GLUCAP in the last 168 hours. Lipid Profile: No results for input(s): CHOL, HDL, LDLCALC, TRIG, CHOLHDL, LDLDIRECT in the last 72 hours. Thyroid Function Tests: No results for input(s): TSH, T4TOTAL, FREET4, T3FREE, THYROIDAB in the last 72 hours. Anemia Panel: No results for input(s): VITAMINB12, FOLATE, FERRITIN, TIBC, IRON, RETICCTPCT in the last 72 hours. Urine analysis:    Component Value Date/Time   COLORURINE YELLOW (A) 11/08/2021 0724   APPEARANCEUR HAZY (A) 11/08/2021 0724   APPEARANCEUR Clear 05/07/2016 1026   LABSPEC 1.025 11/08/2021 0724   LABSPEC 1.015 11/11/2014 0450   PHURINE 5.0 11/08/2021 [place] 11/08/2021 0724  GLUCOSEU Negative 11/11/2014 0450   HGBUR NEGATIVE 11/08/2021 0724   BILIRUBINUR NEGATIVE 11/08/2021 0724   BILIRUBINUR Negative 05/07/2016 1026   BILIRUBINUR Negative 11/11/2014 0450   KETONESUR 5 (A) 11/08/2021 0724   PROTEINUR 30 (A) 11/08/2021 0724   UROBILINOGEN 0.2 04/18/2016 1057   UROBILINOGEN 0.2 06/15/2010 0942   NITRITE NEGATIVE 11/08/2021 0724   LEUKOCYTESUR NEGATIVE 11/08/2021 0724   LEUKOCYTESUR Negative 11/11/2014 0450   Sepsis Labs: @LABRCNTIP (procalcitonin:4,lacticidven:4) ) Recent Results (from the past 240 hour(s))  Resp Panel by  RT-PCR (Flu A&B, Covid) Nasopharyngeal Swab     Status: Abnormal   Collection Time: 12/14/21 10:19 PM   Specimen: Nasopharyngeal Swab; Nasopharyngeal(NP) swabs in vial transport medium  Result Value Ref Range Status   SARS Coronavirus 2 by RT PCR POSITIVE (A) NEGATIVE Final    Comment: (NOTE) SARS-CoV-2 target nucleic acids are DETECTED.  The SARS-CoV-2 RNA is generally detectable in upper respiratory specimens during the acute phase of infection. Positive results are indicative of the presence of the identified virus, but do not rule out bacterial infection or co-infection with other pathogens not detected by the test. Clinical correlation with patient history and other diagnostic information is necessary to determine patient infection status. The expected result is Negative.  Fact Sheet for Patients: EntrepreneurPulse.com.au  Fact Sheet for Healthcare Providers: IncredibleEmployment.be  This test is not yet approved or cleared by the [place] FDA and  has been authorized for detection and/or diagnosis of SARS-CoV-2 by FDA under an Emergency Use Authorization (EUA).  This EUA will remain in effect (meaning this test can be used) for the duration of  the COVID-19 declaration under Section 564(b)(1) of the A ct, 21 U.S.C. section 360bbb-3(b)(1), unless the authorization is terminated or revoked sooner.     Influenza A by PCR NEGATIVE NEGATIVE Final   Influenza B by PCR NEGATIVE NEGATIVE Final    Comment: (NOTE) The Xpert Xpress SARS-CoV-2/FLU/RSV plus assay is intended as an aid in the diagnosis of influenza from Nasopharyngeal swab specimens and should not be used as a sole basis for treatment. Nasal washings and aspirates are unacceptable for Xpert Xpress SARS-CoV-2/FLU/RSV testing.  Fact Sheet for Patients: EntrepreneurPulse.com.au  Fact Sheet for Healthcare  Providers: IncredibleEmployment.be  This test is not yet approved or cleared by the [place] FDA and has been authorized for detection and/or diagnosis of SARS-CoV-2 by FDA under an Emergency Use Authorization (EUA). This EUA will remain in effect (meaning this test can be used) for the duration of the COVID-19 declaration under Section 564(b)(1) of the Act, 21 U.S.C. section 360bbb-3(b)(1), unless the authorization is terminated or revoked.  Performed at [facility], [address] [place], [place] [address]   Resp Panel by RT-PCR (Flu A&B, Covid) Nasopharyngeal Swab     Status: Abnormal   Collection Time: 12/01/2021  8:16 PM   Specimen: Nasopharyngeal Swab; Nasopharyngeal(NP) swabs in vial transport medium  Result Value Ref Range Status   SARS Coronavirus 2 by RT PCR POSITIVE (A) NEGATIVE Final    Comment: (NOTE) SARS-CoV-2 target nucleic acids are DETECTED.  The SARS-CoV-2 RNA is generally detectable in upper respiratory specimens during the acute phase of infection. Positive results are indicative of the presence of the identified virus, but do not rule out bacterial infection or co-infection with other pathogens not detected by the test. Clinical correlation with patient history and other diagnostic information is necessary to determine patient infection status. The expected result is Negative.  Fact Sheet for Patients: EntrepreneurPulse.com.au  Fact Sheet for Healthcare Providers: IncredibleEmployment.be  This test is not yet approved or cleared by the [place] FDA and  has been authorized for detection and/or diagnosis of SARS-CoV-2 by FDA under an Emergency Use Authorization (EUA).  This EUA will remain in effect (meaning this test can be used) for the duration of  the COVID-19 declaration under Section 564(b)(1) of the A ct, 21 U.S.C. section 360bbb-3(b)(1), unless the authorization  is terminated or revoked sooner.     Influenza A by PCR NEGATIVE NEGATIVE Final   Influenza B by PCR NEGATIVE NEGATIVE Final    Comment: (NOTE) The Xpert Xpress SARS-CoV-2/FLU/RSV plus assay is intended as an aid in the diagnosis of influenza from Nasopharyngeal swab specimens and should not be used as a sole basis for treatment. Nasal washings and aspirates are unacceptable for Xpert Xpress SARS-CoV-2/FLU/RSV testing.  Fact Sheet for Patients: EntrepreneurPulse.com.au  Fact Sheet for Healthcare Providers: IncredibleEmployment.be  This test is not yet approved or cleared by the [place] FDA and has been authorized for detection and/or diagnosis of SARS-CoV-2 by FDA under an Emergency Use Authorization (EUA). This EUA will remain in effect (meaning this test can be used) for the duration of the COVID-19 declaration under Section 564(b)(1) of the Act, 21 U.S.C. section 360bbb-3(b)(1), unless the authorization is terminated or revoked.  Performed at [facility], [address], [place], [place] [address]      Radiological Exams on Admission: DG Chest 2 View  Result Date: 11/25/2021 CLINICAL DATA:  Hemoptysis.  Persistent and worsening throat pain. EXAM: CHEST - 2 VIEW COMPARISON:  12/15/2021 FINDINGS: Cardiac pacemaker. Slightly shallow inspiration. Heart size and pulmonary vascularity are normal. There is developing airspace disease in the right mid lung since prior study suggesting developing pneumonia. No pleural effusions. No pneumothorax. Mediastinal contours appear intact. Calcification of the aorta. IMPRESSION: Developing focus of airspace disease in the right mid lung since prior study suggesting pneumonia. Electronically Signed   By: Lucienne Capers M.D.   On: 12/11/2021 17:15   CT Soft Tissue Neck W Contrast  Result Date: 12/18/2021 CLINICAL DATA:  Neck soft tissue swelling.  Sore throat. EXAM: CT NECK WITH CONTRAST  TECHNIQUE: Multidetector CT imaging of the neck was performed using the standard protocol following the bolus administration of intravenous contrast. CONTRAST:  37mL OMNIPAQUE IOHEXOL 300 MG/ML  SOLN COMPARISON:  Chest CT 12/04/2021. FINDINGS: PHARYNX AND LARYNX: The nasopharynx, oropharynx and larynx are normal. Visible portions of the oral cavity, tongue base and floor of mouth are normal. Normal epiglottis, vallecula and pyriform sinuses. The larynx is normal. No retropharyngeal abscess, effusion or lymphadenopathy. SALIVARY GLANDS: Normal parotid, submandibular and sublingual glands. THYROID: Normal. LYMPH NODES: No enlarged or abnormal density lymph nodes. VASCULAR: Calcific aortic atherosclerosis. LIMITED INTRACRANIAL: Normal. VISUALIZED ORBITS: Normal. MASTOIDS AND VISUALIZED PARANASAL SINUSES: No fluid levels or advanced mucosal thickening. No mastoid effusion. SKELETON: No bony spinal canal stenosis. No lytic or blastic lesions. UPPER CHEST: Incompletely visualized paramediastinal opacity in the right upper lobe. This is better characterized on the recent chest CT. OTHER: None. IMPRESSION: 1. No acute abnormality of the neck. 2. Incompletely visualized paramediastinal opacity in the right upper lobe, better characterized on the recent chest CT. Aortic Atherosclerosis (ICD10-I70.0). Electronically Signed   By: Ulyses Jarred M.D.   On: 12/09/2021 22:14   CT CHEST WO CONTRAST  Result Date: 12/21/2021 CLINICAL DATA:  Dyspnea. EXAM: CT CHEST WITHOUT CONTRAST TECHNIQUE: Multidetector CT imaging of the chest was performed following the  standard protocol without IV contrast. COMPARISON:  December 04, 2021 FINDINGS: Cardiovascular: A multi lead AICD is seen. There is marked severity calcification of the thoracic aorta, without evidence of aortic aneurysm. There is mild cardiomegaly with marked severity coronary artery calcification. No pericardial effusion. Mediastinum/Nodes: Mild AP window and pretracheal  lymphadenopathy is seen. Thyroid gland, trachea, and esophagus demonstrate no significant findings. Lungs/Pleura: Marked severity multifocal infiltrates are seen within the right upper lobe. This is increased in severity when compared to the prior study. A large area of consolidation is again seen within the posterior aspect of the right lower lobe. This is stable in appearance when compared to the prior study. Mild atelectasis is seen within the posterior aspects of the left upper lobe and left lung base. A stable 2.2 cm partially calcified lung nodule is seen along the left lingula. A partially calcified 2.1 cm x 1.7 cm soft tissue nodule is seen within the anteromedial aspect of the right middle lobe. There is a small right pleural effusion which is decreased in size when compared to the prior study. No pneumothorax is identified. Upper Abdomen: Multiple surgical clips are seen within the gallbladder fossa. Musculoskeletal: Multilevel degenerative changes are seen throughout the thoracic spine. IMPRESSION: 1. Marked severity multifocal right upper lobe infiltrates, increased in severity when compared to the prior study. 2. Large, stable area of posterior right lower lobe consolidation. Follow-up to resolution is recommended to exclude the presence of an underlying neoplasm. 3. Stable partially calcified lung nodules within the left lingula and right middle lobe. 4. Small right pleural effusion, decreased in size when compared to the prior study. 5. Mild cardiomegaly with marked severity coronary artery calcification. 6. Evidence of prior cholecystectomy. Aortic Atherosclerosis (ICD10-I70.0). Electronically Signed   By: Virgina Norfolk M.D.   On: 12/21/2021 00:42    EKG: Independently reviewed.  Paced rhythm.  Assessment/Plan Principal Problem:   Pneumonia due to COVID-19 virus Active Problems:   Atrioventricular block, complete (HCC)   Type 2 diabetes mellitus without complications (HCC)   CAD  (coronary artery disease)   Adenocarcinoma (HCC)   AAA (abdominal aortic aneurysm) without rupture   CKD stage 3 due to type 2 diabetes mellitus ([place])   Dysphagia   CAP (community acquired pneumonia)    Multifocal pneumonia -likely from COVID-19 infection presently on 3 L oxygen which is at baseline.  Patient has been empirically placed on antibiotics for community-acquired pneumonia and will also start patient on remdesivir.  Since patient has wheezing we will keep patient.  We will check inflammatory markers procalcitonin and cardiac markers.  Closely monitor respiratory status. Dysphagia/odynophagia difficulty to swallow.  We will keep patient n.p.o. for now and get swallow therapy evaluation.  May need GI consult. History of splenic infarct on Eliquis.  Since patient is n.p.o. we will keep patient on heparin.  If patient's hemoptysis or hemoglobin worsens will hold off anticoagulation. History of COPD mildly wheezing we will continue on inhalers.  On steroids for wheezing. Diabetes mellitus type 2 -since patient is n.p.o. we will keep patient on sliding scale coverage for now. Patient has chronic diarrhea takes Lomotil and Xifaxan. Non-anion gap metabolic acidosis likely from diarrhea.  Follow metabolic panel. Chronic systolic heart failure last EF measured was 20 to 25% presently NPO.  Closely follow respiratory status.  Recently admitted for CHF exacerbation. History of non-small cell lung cancer s/p radiation. Anemia appears to be chronic follow CBC. Complete heart block status post pacemaker placement. History of abdominal aortic  aneurysm.  Denies any abdominal pain.  Since patient has multifocal pneumonia will need close monitoring and inpatient status.   DVT prophylaxis: Heparin infusion. Code Status: DNR. Family Communication: Patient's wife. Disposition Plan: Home. Consults called: Speech therapy. Admission status: Inpatient.   Rise Patience MD [facility] Pager [phone].  If 7PM-7AM, please contact night-coverage [email or web address] Password [email or web address]  12/21/2021, 2:49 AM

## 2021-12-21 NOTE — ED Provider Notes (Signed)
----------------------------------------- °  3:10 AM on 12/21/2021 -----------------------------------------   Patient's wife came out to the nursing station asking for help for patient short of breath.  On nasal cannula oxygen sats are 96%.  He is tachypneic with wheezing.  Has albuterol MDI ordered; will administer nebulizer treatment and inform hospitalist services.   Paulette Blanch, MD 12/21/21 [id number]

## 2021-12-21 NOTE — ED Notes (Signed)
Pt had green loose BM in bed. Peri care provide. All linens changed. 200cc yellow urine noted in canister on wall from condom cath. Pt given 2 warm blankets. Stretcher locked low; rail up; call bell within reach; family remains at bedside.

## 2021-12-21 NOTE — Progress Notes (Signed)
Name: Howard [name] MRN: [id number] DOB: [birth year]     Subjective: Contacted by ED physician to eval patient at bedside.  According to ED RN patient is tachypneic increased work of breathing and has a wide-complex tachycardia.   Objective:   Blood pressure (!) 147/90, pulse (!) 108, temperature 98.2 F (36.8 C), temperature source Oral, resp. rate 15, height 5\' 9"  (1.753 m), weight 77.1 kg, SpO2 95 %.    Results for orders placed or performed during the hospital encounter of 12/05/2021 (from the past 24 hour(s))  Comprehensive metabolic panel     Status: Abnormal   Collection Time: 12/04/2021  5:14 PM  Result Value Ref Range   Sodium 138 135 - 145 mmol/L   Potassium 3.8 3.5 - 5.1 mmol/L   Chloride 112 (H) 98 - 111 mmol/L   CO2 15 (L) 22 - 32 mmol/L   Glucose, Bld 138 (H) 70 - 99 mg/dL   BUN 22 8 - 23 mg/dL   Creatinine, Ser 1.11 0.61 - 1.24 mg/dL   Calcium 9.2 8.9 - 10.3 mg/dL   Total Protein 7.6 6.5 - 8.1 g/dL   Albumin 3.4 (L) 3.5 - 5.0 g/dL   AST 26 15 - 41 U/L   ALT 14 0 - 44 U/L   Alkaline Phosphatase 58 38 - 126 U/L   Total Bilirubin 0.8 0.3 - 1.2 mg/dL   GFR, Estimated >60 >60 mL/min   Anion gap 11 5 - 15  CBC with Differential     Status: Abnormal   Collection Time: 11/30/2021  5:14 PM  Result Value Ref Range   WBC 5.5 4.0 - 10.5 K/uL   RBC 3.85 (L) 4.22 - 5.81 MIL/uL   Hemoglobin 10.5 (L) 13.0 - 17.0 g/dL   HCT 34.0 (L) 39.0 - 52.0 %   MCV 88.3 80.0 - 100.0 fL   MCH 27.3 26.0 - 34.0 pg   MCHC 30.9 30.0 - 36.0 g/dL   RDW 15.9 (H) 11.5 - 15.5 %   Platelets 284 150 - 400 K/uL   nRBC 0.0 0.0 - 0.2 %   Neutrophils Relative % 66 %   Neutro Abs 3.6 1.7 - 7.7 K/uL   Lymphocytes Relative 22 %   Lymphs Abs 1.2 0.7 - 4.0 K/uL   Monocytes Relative 11 %   Monocytes Absolute 0.6 0.1 - 1.0 K/uL   Eosinophils Relative 1 %   Eosinophils Absolute 0.0 0.0 - 0.5 K/uL   Basophils Relative 0 %   Basophils Absolute 0.0 0.0 - 0.1 K/uL   Immature Granulocytes 0 %   Abs  Immature Granulocytes 0.02 0.00 - 0.07 K/uL  Troponin I (High Sensitivity)     Status: Abnormal   Collection Time: 12/13/2021  5:14 PM  Result Value Ref Range   Troponin I (High Sensitivity) 21 (H) <18 ng/L  Resp Panel by RT-PCR (Flu A&B, Covid) Nasopharyngeal Swab     Status: Abnormal   Collection Time: 11/24/2021  8:16 PM   Specimen: Nasopharyngeal Swab; Nasopharyngeal(NP) swabs in vial transport medium  Result Value Ref Range   SARS Coronavirus 2 by RT PCR POSITIVE (A) NEGATIVE   Influenza A by PCR NEGATIVE NEGATIVE   Influenza B by PCR NEGATIVE NEGATIVE  Blood gas, arterial     Status: Abnormal   Collection Time: 12/21/21  3:57 AM  Result Value Ref Range   FIO2 0.21    pH, Arterial 7.39 7.350 - 7.450   pCO2 arterial 19 (LL) 32.0 -  48.0 mmHg   pO2, Arterial 79 (L) 83.0 - 108.0 mmHg   Bicarbonate 11.5 (L) 20.0 - 28.0 mmol/L   Acid-base deficit 11.6 (H) 0.0 - 2.0 mmol/L   O2 Saturation 95.4 %   Patient temperature 37.0    Collection site LEFT RADIAL    Sample type ARTERIAL DRAW    Allens test (pass/fail) PASS PASS  Procalcitonin     Status: None   Collection Time: 12/21/21  4:02 AM  Result Value Ref Range   Procalcitonin <0.10 ng/mL  Troponin I (High Sensitivity)     Status: Abnormal   Collection Time: 12/21/21  4:02 AM  Result Value Ref Range   Troponin I (High Sensitivity) 40 (H) <18 ng/L  APTT     Status: Abnormal   Collection Time: 12/21/21  4:02 AM  Result Value Ref Range   aPTT 47 (H) 24 - 36 seconds  Protime-INR     Status: Abnormal   Collection Time: 12/21/21  4:02 AM  Result Value Ref Range   Prothrombin Time 16.2 (H) 11.4 - 15.2 seconds   INR 1.3 (H) 0.8 - 1.2  Heparin level (unfractionated)     Status: None   Collection Time: 12/21/21  4:02 AM  Result Value Ref Range   Heparin Unfractionated 0.44 0.30 - 0.70 IU/mL  Troponin I (High Sensitivity)     Status: Abnormal   Collection Time: 12/21/21  4:02 AM  Result Value Ref Range   Troponin I (High Sensitivity) 38  (H) <18 ng/L  Comprehensive metabolic panel     Status: Abnormal   Collection Time: 12/21/21  4:02 AM  Result Value Ref Range   Sodium 138 135 - 145 mmol/L   Potassium 3.6 3.5 - 5.1 mmol/L   Chloride 113 (H) 98 - 111 mmol/L   CO2 14 (L) 22 - 32 mmol/L   Glucose, Bld 143 (H) 70 - 99 mg/dL   BUN 20 8 - 23 mg/dL   Creatinine, Ser 1.01 0.61 - 1.24 mg/dL   Calcium 8.9 8.9 - 10.3 mg/dL   Total Protein 6.8 6.5 - 8.1 g/dL   Albumin 3.1 (L) 3.5 - 5.0 g/dL   AST 24 15 - 41 U/L   ALT 13 0 - 44 U/L   Alkaline Phosphatase 54 38 - 126 U/L   Total Bilirubin 0.8 0.3 - 1.2 mg/dL   GFR, Estimated >60 >60 mL/min   Anion gap 11 5 - 15  D-dimer, quantitative     Status: Abnormal   Collection Time: 12/21/21  4:02 AM  Result Value Ref Range   D-Dimer, Quant 1.52 (H) 0.00 - 0.50 ug/mL-FEU  CBG monitoring, ED     Status: Abnormal   Collection Time: 12/21/21  4:08 AM  Result Value Ref Range   Glucose-Capillary 106 (H) 70 - 99 mg/dL   *Note: Due to a large number of results and/or encounters for the requested time period, some results have not been displayed. A complete set of results can be found in Results Review.    Imaging   DG Chest 1 View  Result Date: 12/21/2021 CLINICAL DATA:  Shortness of breath. EXAM: CHEST  1 VIEW COMPARISON:  December 20, 2021 FINDINGS: Mild to moderate severity infiltrates are seen within the mid right lung, right suprahilar and bilateral infrahilar regions. These are increased in severity when compared to the prior study. There is no evidence of a pleural effusion or pneumothorax. The heart size and mediastinal contours are within normal limits. There is marked  severity calcification of the thoracic aorta. Multilevel degenerative changes are seen throughout the thoracic spine. IMPRESSION: Worsening mild to moderate severity bilateral infiltrates, as described above. Electronically Signed   By: Virgina Norfolk M.D.   On: 12/21/2021 03:50   DG Chest 2 View  Result Date:  11/23/2021 CLINICAL DATA:  Hemoptysis.  Persistent and worsening throat pain. EXAM: CHEST - 2 VIEW COMPARISON:  12/15/2021 FINDINGS: Cardiac pacemaker. Slightly shallow inspiration. Heart size and pulmonary vascularity are normal. There is developing airspace disease in the right mid lung since prior study suggesting developing pneumonia. No pleural effusions. No pneumothorax. Mediastinal contours appear intact. Calcification of the aorta. IMPRESSION: Developing focus of airspace disease in the right mid lung since prior study suggesting pneumonia. Electronically Signed   By: Lucienne Capers M.D.   On: 11/28/2021 17:15   CT Soft Tissue Neck W Contrast  Result Date: 11/28/2021 CLINICAL DATA:  Neck soft tissue swelling.  Sore throat. EXAM: CT NECK WITH CONTRAST TECHNIQUE: Multidetector CT imaging of the neck was performed using the standard protocol following the bolus administration of intravenous contrast. CONTRAST:  9mL OMNIPAQUE IOHEXOL 300 MG/ML  SOLN COMPARISON:  Chest CT 12/04/2021. FINDINGS: PHARYNX AND LARYNX: The nasopharynx, oropharynx and larynx are normal. Visible portions of the oral cavity, tongue base and floor of mouth are normal. Normal epiglottis, vallecula and pyriform sinuses. The larynx is normal. No retropharyngeal abscess, effusion or lymphadenopathy. SALIVARY GLANDS: Normal parotid, submandibular and sublingual glands. THYROID: Normal. LYMPH NODES: No enlarged or abnormal density lymph nodes. VASCULAR: Calcific aortic atherosclerosis. LIMITED INTRACRANIAL: Normal. VISUALIZED ORBITS: Normal. MASTOIDS AND VISUALIZED PARANASAL SINUSES: No fluid levels or advanced mucosal thickening. No mastoid effusion. SKELETON: No bony spinal canal stenosis. No lytic or blastic lesions. UPPER CHEST: Incompletely visualized paramediastinal opacity in the right upper lobe. This is better characterized on the recent chest CT. OTHER: None. IMPRESSION: 1. No acute abnormality of the neck. 2. Incompletely  visualized paramediastinal opacity in the right upper lobe, better characterized on the recent chest CT. Aortic Atherosclerosis (ICD10-I70.0). Electronically Signed   By: Ulyses Jarred M.D.   On: 12/03/2021 22:14   CT CHEST WO CONTRAST  Result Date: 12/21/2021 CLINICAL DATA:  Dyspnea. EXAM: CT CHEST WITHOUT CONTRAST TECHNIQUE: Multidetector CT imaging of the chest was performed following the standard protocol without IV contrast. COMPARISON:  December 04, 2021 FINDINGS: Cardiovascular: A multi lead AICD is seen. There is marked severity calcification of the thoracic aorta, without evidence of aortic aneurysm. There is mild cardiomegaly with marked severity coronary artery calcification. No pericardial effusion. Mediastinum/Nodes: Mild AP window and pretracheal lymphadenopathy is seen. Thyroid gland, trachea, and esophagus demonstrate no significant findings. Lungs/Pleura: Marked severity multifocal infiltrates are seen within the right upper lobe. This is increased in severity when compared to the prior study. A large area of consolidation is again seen within the posterior aspect of the right lower lobe. This is stable in appearance when compared to the prior study. Mild atelectasis is seen within the posterior aspects of the left upper lobe and left lung base. A stable 2.2 cm partially calcified lung nodule is seen along the left lingula. A partially calcified 2.1 cm x 1.7 cm soft tissue nodule is seen within the anteromedial aspect of the right middle lobe. There is a small right pleural effusion which is decreased in size when compared to the prior study. No pneumothorax is identified. Upper Abdomen: Multiple surgical clips are seen within the gallbladder fossa. Musculoskeletal: Multilevel degenerative changes are seen throughout  the thoracic spine. IMPRESSION: 1. Marked severity multifocal right upper lobe infiltrates, increased in severity when compared to the prior study. 2. Large, stable area of  posterior right lower lobe consolidation. Follow-up to resolution is recommended to exclude the presence of an underlying neoplasm. 3. Stable partially calcified lung nodules within the left lingula and right middle lobe. 4. Small right pleural effusion, decreased in size when compared to the prior study. 5. Mild cardiomegaly with marked severity coronary artery calcification. 6. Evidence of prior cholecystectomy. Aortic Atherosclerosis (ICD10-I70.0). Electronically Signed   By: Virgina Norfolk M.D.   On: 12/21/2021 00:42        Assessment/Plan: 12/21/21 0630  COVID pneumonia Patient COVID-positive 6 days ago, on exam patient is rhonchorous throughout with decreased movement in his right lower lobe.  Repeat chest x-ray shows worsening of prior patchy infiltrates.   -  ABG ordered; pH 7.39, CO2 19, bicarb 11.5. -  20 mg IV Lasix ordered. -  Albuterol nebulizer ordered by ED physician -  Continue Ventolin inhaler and Duoneb nebulizer  -  Continue Rocephin and doxycycline  -  Continue IV solu-medrol ordered  Wide complex tachycardia  - HR in 110s-120s on bedside monitor, will not treat at this time. - Patient is ventricular paced at baseline  Patient discussed with Dr Hal Hope as admission H&P and orders were still in progress.   Neomia Glass MHA, MSN, FNP-BC Nurse Practitioner [facility]

## 2021-12-21 NOTE — ED Notes (Signed)
CBG 162. Wife at bedside. Pt resting in bed.

## 2021-12-21 NOTE — ED Notes (Signed)
RT at bedside to set up bipap.

## 2021-12-21 NOTE — ED Notes (Signed)
Up most of the night. No complaints of pain. Since arrival has become more tachycardic but showing paced rhythm. RR mid to upper 20's. Increased work of breathing with exertion. Continues on oxygen at 3 lpm. Coughing up tan bloody sputum. Afebrile. Pt is HOH. Alert and orientated to person, place, and situation. Incontinent stool x 1. Wife reports that he has been having loose stools for 2 months and has been worked up. Finished a medication yesterday for his stools. Stool was gelatin like, green, with foul odor.

## 2021-12-21 NOTE — ED Notes (Signed)
Pt is sleeping with family at bedside. Wife stated pt told her he was breathing more easily after Bipap was placed.

## 2021-12-21 NOTE — ED Notes (Signed)
Pt is tachycardic and tachypneic with RR in 30s. Pt has paced rhythm but HR is 110-120 and regular. Spoke with Dr Ouida Sills hospitalist on phone to let know of VS. Will try PO metoprolol with applesauce although wife states pt has been struggling to swallow pills and pt does continue to have bloody sputum when coughs. Dr Ouida Sills will evaluate pt at bedside.

## 2021-12-21 NOTE — ED Notes (Signed)
Sent med messages to have 1000 meds sent from main pharmacy.

## 2021-12-21 NOTE — ED Notes (Signed)
Pt laying calmly on stretcher. Stretcher locked low. Rail up. Pt remains on BiPAP. Visitor remains at bedside.

## 2021-12-21 NOTE — ED Notes (Signed)
Hospitalist at bedside evaluating pt and discussing plan of care with wife.

## 2021-12-21 NOTE — ED Notes (Signed)
Bipap was placed. ABG from last night showed bicarb of 11.5 and CO2 of 19. Informed attending hospitalist.

## 2021-12-21 NOTE — ED Notes (Signed)
Patient denies pain and is resting comfortably.  

## 2021-12-21 NOTE — Progress Notes (Signed)
Remdesivir - Pharmacy Brief Note   O:  CXR: In progress SpO2: 90-100% on RA   A/P:  Remdesivir 200 mg IVPB once followed by 100 mg IVPB daily x 4 days.   Renda Rolls, PharmD, [facility] 12/21/2021 3:41 AM

## 2021-12-21 NOTE — ED Notes (Signed)
Pt denies pain and denies nausea.

## 2021-12-21 NOTE — Progress Notes (Signed)
ANTICOAGULATION CONSULT NOTE  Pharmacy Consult for heparin infusion Indication: atrial fibrillation  Allergies  Allergen Reactions   Sulfa Antibiotics Rash    Patient Measurements: Height: 5\' 9"  (175.3 cm) Weight: 77.1 kg (170 lb) IBW/kg (Calculated) : 70.7 Heparin Dosing Weight: 77.1 kg  Vital Signs: Temp: 98.2 F (36.8 C) (12/29 1749) Temp Source: Oral (12/29 1749) BP: 147/90 (12/30 0230) Pulse Rate: 108 (12/30 0230)  Labs: Recent Labs    12/05/2021 1714  HGB 10.5*  HCT 34.0*  PLT 284  CREATININE 1.11  TROPONINIHS 21*    Estimated Creatinine Clearance: 46 mL/min (by C-G formula based on SCr of 1.11 mg/dL).   Medical History: Past Medical History:  Diagnosis Date   Arthritis    Bell palsy    Bell's palsy 04/12/2015   Cancer [facility])    prostate and skin   Chronic combined systolic and diastolic CHF, NYHA class 1 ([place])    a. 07/2014 Echo: EF 35-40%, Gr 1 DD; b. 03/2021 Echo: EF 20-25%; c. 10/2021 Echo: EF 20-25%, glob HK, GrII DD, nl RV fxn, mod LAE, mild to mod MR, mild-mod AoV scelrosis, Ao root 20mm.   Complete heart block ([place])    a. 11/2010 s/p SJM 2210 [facility] DC PPM, ser# [id number].   Depression    Diabetes mellitus without complication [facility])    Fall 11/10/2014   GERD (gastroesophageal reflux disease)    History of prostate cancer    Hyperlipidemia    Hypertension    LBBB (left bundle branch block)    Left-sided Bell's palsy    Lung cancer ([place]) 2016   NICM (nonischemic cardiomyopathy) ([place])    a. 07/2014 Echo: EF 35-40%, Gr 1 DD; b. 03/2021 Echo: EF 20-25%; c. 03/2021 Cath:  d. 10/2021 Echo: EF 20-25%, glob HK, GrII DD.   Non-obstructive CAD    a. 07/2014 Abnl MV;  b. 08/2014 Cath: LM nl, LAD 30p, RI 40p, LCX nl, OM1 40, RCA dominant 30p, 70d-->Med Rx; c. 03/2021 Cath: LM nl, LAD 20p/m, LCX 30ost/p, OM1 40, OM2 60, RCA 30p, 50d. RHC w/ minimally elevated filling pressures.   Poor balance    Presence of permanent cardiac pacemaker    Sleep apnea    a. cpap    Vertigo    WPW (Wolff-Parkinson-White syndrome)    a. S/P RFCA 1991.    Medications:  PTA Meds:  Apixaban 5 mg BID  Assessment: Pt is 87 yo male with h/o CAD on Apixaban, being transitioned to heparin infusion.  Goal of Therapy:  Heparin level 0.3-0.7 units/ml aPTT 66-102 seconds Monitor platelets by anticoagulation protocol: Yes   Plan:  Bolus 4000 units x 1 Start heparin infusion at 1100 units/hr Will follow aPTT, until correlation with HL is confirmed Check aPTT 8 hours after start of infusion CBC daily while on heparin.  Renda Rolls, PharmD, [facility] 12/21/2021 3:44 AM

## 2021-12-21 NOTE — ED Notes (Signed)
No floor RN yet assigned to pt's chart. Will call soon if this remains the case.

## 2021-12-21 NOTE — ED Notes (Signed)
Informed RN bed assigned 

## 2021-12-21 NOTE — ED Notes (Signed)
Educated and updated family at bedside with pt's verbal okay.

## 2021-12-21 NOTE — Progress Notes (Signed)
°  INTERVAL PROGRESS NOTE    Howard [name]- 87 y.o. male  LOS: 1 __________________________________________________________________  SUBJECTIVE: Admitted 12/22/2021 with cc of cough, painful swallowing Chief Complaint  Patient presents with   Sore Throat   Since admission, patient has continued cough with bloody sputum and has become fatigued in his breathing effort. He expresses anxiety with his breathing status.  Wife is at bedside and also expresses concern for his increased breathing effort this morning.   OBJECTIVE: Blood pressure (!) 149/77, pulse (!) 103, temperature 98 F (36.7 C), resp. rate (!) 28, height 5\' 9"  (1.753 m), weight 77.1 kg, SpO2 97 %.  General: in acute distress Cardiac: RRR, normal heart sounds. 2+ radial and PT pulses bilaterally Respiratory: increased accessory muscle use, speaking in few word phrases at a time. Decreased breath sounds at bilateral bases. Minimal wheezing at apices. No cyanosis. Intermittent cough with bloody sputum.  Abdomen: soft, nontender, nondistended, no hepatic or splenomegaly, +BS Extremities: no edema. WWP. Skin: warm and dry, no rashes noted Neuro: alert and oriented, no focal deficits Psych: anxious affect and mood   ASSESSMENT/PLAN:  I have reviewed the full H&P by Dr. Hal Hope, and I agree with the assessment and plan as outlined therein. In addition: Respiratory status seems to have declined since admission due to respiratory fatigue.  Requested respiratory therapy apply bipap.  Confirmed with patient and his wife that he would not wish to be intubated if the bipap does not improve his respiratory status.  Will follow up this afternoon if patient is improving on bipap.  - continue IV antibiotics - discontinued remdesivir as patient has already completed treatment and is >5 days since onset.   Principal Problem:   Pneumonia due to COVID-19 virus Active Problems:   Atrioventricular block, complete (HCC)   Type 2  diabetes mellitus without complications (HCC)   CAD (coronary artery disease)   Adenocarcinoma ([place])   AAA (abdominal aortic aneurysm) without rupture   CKD stage 3 due to type 2 diabetes mellitus ([place])   Dysphagia   CAP (community acquired pneumonia)   Richarda Osmond, DO [facility] 12/21/2021, 11:13 AM    [email or web address] Available by Epic secure chat 7AM-7PM. If 7PM-7AM, please contact night-coverage   No Charge

## 2021-12-21 NOTE — Progress Notes (Addendum)
SLP Cancellation Note  Patient Details Name: Howard [name] MRN: [id number] DOB: December 16, 1931   Cancelled treatment:       Reason Eval/Treat Not Completed: Patient not medically ready;Medical issues which prohibited therapy (chart reviewed) Pt remains on BiPAP at this time d/t Pulmonary decline earlier this morning. Pt has been tachycardic and tachypneic with RR in 30s per chart.  Pt admitted to the ED w/ dx of Pneumonia due to COVID-19 virus. Pt's Wife reported to [place] that he has been "struggling to swallow pills and pt does continue to have bloody sputum when coughs.".  Pt is currently NPO -- pt would be at increased risk for Aspiration w/ oral intake w/ such declined Pulmonary status. Recommend continue NPO status until BSE can be performed. Recommend IF any necessary meds to be given, then to give CRUSHED in Puree for safer swallowing. Recommend frequent oral care d/t NPO status. ST services will f/u tomorrow.      Orinda Kenner, MS, CCC-SLP Speech Language Pathologist Rehab Services [phone] [facility] 12/21/2021, 1:43 PM

## 2021-12-21 NOTE — ED Notes (Signed)
Floor states they cannot take pt's on BiPAP; looped attending into secure chat to notify and see if can trial pt on [facility]NC or [place] post vbg/abg collection if needed. Awaiting orders.

## 2021-12-21 NOTE — ED Notes (Signed)
Called RT who will come to place bipap.

## 2021-12-22 DIAGNOSIS — I509 Heart failure, unspecified: Secondary | ICD-10-CM

## 2021-12-22 DIAGNOSIS — E1159 Type 2 diabetes mellitus with other circulatory complications: Secondary | ICD-10-CM

## 2021-12-22 DIAGNOSIS — U071 COVID-19: Secondary | ICD-10-CM | POA: Diagnosis present

## 2021-12-22 DIAGNOSIS — I119 Hypertensive heart disease without heart failure: Secondary | ICD-10-CM | POA: Diagnosis not present

## 2021-12-22 DIAGNOSIS — Z7984 Long term (current) use of oral hypoglycemic drugs: Secondary | ICD-10-CM | POA: Diagnosis not present

## 2021-12-22 DIAGNOSIS — J1282 Pneumonia due to coronavirus disease 2019: Secondary | ICD-10-CM | POA: Diagnosis not present

## 2021-12-22 DIAGNOSIS — R627 Adult failure to thrive: Secondary | ICD-10-CM | POA: Diagnosis not present

## 2021-12-22 DIAGNOSIS — L8990 Pressure ulcer of unspecified site, unspecified stage: Secondary | ICD-10-CM | POA: Insufficient documentation

## 2021-12-22 DIAGNOSIS — J9621 Acute and chronic respiratory failure with hypoxia: Secondary | ICD-10-CM

## 2021-12-22 LAB — CBC WITH DIFFERENTIAL/PLATELET
Abs Immature Granulocytes: 0.02 10*3/uL (ref 0.00–0.07)
Basophils Absolute: 0 10*3/uL (ref 0.0–0.1)
Basophils Relative: 0 %
Eosinophils Absolute: 0 10*3/uL (ref 0.0–0.5)
Eosinophils Relative: 0 %
HCT: 27.1 % — ABNORMAL LOW (ref 39.0–52.0)
Hemoglobin: 8.8 g/dL — ABNORMAL LOW (ref 13.0–17.0)
Immature Granulocytes: 1 %
Lymphocytes Relative: 9 %
Lymphs Abs: 0.4 10*3/uL — ABNORMAL LOW (ref 0.7–4.0)
MCH: 27.5 pg (ref 26.0–34.0)
MCHC: 32.5 g/dL (ref 30.0–36.0)
MCV: 84.7 fL (ref 80.0–100.0)
Monocytes Absolute: 0.2 10*3/uL (ref 0.1–1.0)
Monocytes Relative: 4 %
Neutro Abs: 3.7 10*3/uL (ref 1.7–7.7)
Neutrophils Relative %: 86 %
Platelets: 323 10*3/uL (ref 150–400)
RBC: 3.2 MIL/uL — ABNORMAL LOW (ref 4.22–5.81)
RDW: 15.9 % — ABNORMAL HIGH (ref 11.5–15.5)
WBC: 4.3 10*3/uL (ref 4.0–10.5)
nRBC: 0 % (ref 0.0–0.2)

## 2021-12-22 LAB — COMPREHENSIVE METABOLIC PANEL
ALT: 18 U/L (ref 0–44)
AST: 19 U/L (ref 15–41)
Albumin: 2.9 g/dL — ABNORMAL LOW (ref 3.5–5.0)
Alkaline Phosphatase: 46 U/L (ref 38–126)
Anion gap: 14 (ref 5–15)
BUN: 38 mg/dL — ABNORMAL HIGH (ref 8–23)
CO2: 13 mmol/L — ABNORMAL LOW (ref 22–32)
Calcium: 8.9 mg/dL (ref 8.9–10.3)
Chloride: 115 mmol/L — ABNORMAL HIGH (ref 98–111)
Creatinine, Ser: 1.32 mg/dL — ABNORMAL HIGH (ref 0.61–1.24)
GFR, Estimated: 52 mL/min — ABNORMAL LOW (ref >60)
Glucose, Bld: 238 mg/dL — ABNORMAL HIGH (ref 70–99)
Potassium: 4 mmol/L (ref 3.5–5.1)
Sodium: 142 mmol/L (ref 135–145)
Total Bilirubin: 0.7 mg/dL (ref 0.3–1.2)
Total Protein: 6.7 g/dL (ref 6.5–8.1)

## 2021-12-22 LAB — TROPONIN I (HIGH SENSITIVITY): Troponin I (High Sensitivity): 215 ng/L (ref <18)

## 2021-12-22 LAB — D-DIMER, QUANTITATIVE: D-Dimer, Quant: 1.32 ug/mL-FEU — ABNORMAL HIGH (ref 0.00–0.50)

## 2021-12-22 LAB — C-REACTIVE PROTEIN: CRP: 10.3 mg/dL — ABNORMAL HIGH (ref <1.0)

## 2021-12-22 LAB — CBG MONITORING, ED: Glucose-Capillary: 229 mg/dL — ABNORMAL HIGH (ref 70–99)

## 2021-12-22 MED ORDER — MORPHINE SULFATE (PF) 2 MG/ML IV SOLN
2.0000 mg | Freq: Once | INTRAVENOUS | Status: AC
  Administered 2021-12-22: 2 mg via INTRAVENOUS
  Filled 2021-12-22: qty 1

## 2021-12-22 MED ORDER — GLYCOPYRROLATE 0.2 MG/ML IJ SOLN
0.2000 mg | INTRAMUSCULAR | Status: DC | PRN
  Administered 2021-12-23: 0.2 mg via INTRAVENOUS
  Filled 2021-12-22 (×3): qty 1

## 2021-12-22 MED ORDER — MORPHINE BOLUS VIA INFUSION
1.0000 mg | INTRAVENOUS | Status: DC | PRN
  Administered 2021-12-22 – 2021-12-23 (×4): 1 mg via INTRAVENOUS
  Filled 2021-12-22: qty 1

## 2021-12-22 MED ORDER — GLYCOPYRROLATE 0.2 MG/ML IJ SOLN
0.2000 mg | INTRAMUSCULAR | Status: DC | PRN
  Filled 2021-12-22: qty 1

## 2021-12-22 MED ORDER — MORPHINE 100MG IN NS 100ML (1MG/ML) PREMIX INFUSION
10.0000 mg/h | INTRAVENOUS | Status: DC
  Administered 2021-12-22: 1 mg/h via INTRAVENOUS
  Administered 2021-12-23: 10 mg/h via INTRAVENOUS
  Administered 2021-12-24: 8 mg/h via INTRAVENOUS
  Administered 2021-12-24: 10 mg/h via INTRAVENOUS
  Filled 2021-12-22 (×6): qty 100

## 2021-12-22 MED ORDER — LORAZEPAM 2 MG/ML PO CONC
1.0000 mg | ORAL | Status: DC | PRN
  Filled 2021-12-22: qty 0.5

## 2021-12-22 MED ORDER — MORPHINE SULFATE (PF) 2 MG/ML IV SOLN
1.0000 mg | INTRAVENOUS | Status: DC | PRN
  Administered 2021-12-22: 1 mg via INTRAVENOUS
  Filled 2021-12-22: qty 1

## 2021-12-22 MED ORDER — GLYCOPYRROLATE 1 MG PO TABS
1.0000 mg | ORAL_TABLET | ORAL | Status: DC | PRN
  Filled 2021-12-22: qty 1

## 2021-12-22 MED ORDER — LORAZEPAM 1 MG PO TABS: 1.0000 mg | ORAL_TABLET | ORAL | Status: DC | PRN

## 2021-12-22 MED ORDER — LORAZEPAM 2 MG/ML IJ SOLN
1.0000 mg | INTRAMUSCULAR | Status: DC | PRN
  Administered 2021-12-22 – 2021-12-24 (×7): 1 mg via INTRAVENOUS
  Filled 2021-12-22 (×7): qty 1

## 2021-12-22 MED ORDER — HALOPERIDOL LACTATE 2 MG/ML PO CONC
0.5000 mg | ORAL | Status: DC | PRN
  Filled 2021-12-22: qty 0.3

## 2021-12-22 MED ORDER — HALOPERIDOL LACTATE 5 MG/ML IJ SOLN: 0.5000 mg | INTRAMUSCULAR | Status: DC | PRN

## 2021-12-22 MED ORDER — HALOPERIDOL 0.5 MG PO TABS
0.5000 mg | ORAL_TABLET | ORAL | Status: DC | PRN
  Filled 2021-12-22: qty 1

## 2021-12-22 MED ORDER — SODIUM CHLORIDE 0.9 % IV SOLN: 100.0000 mg | Freq: Every day | INTRAVENOUS | Status: DC

## 2021-12-22 MED ORDER — SODIUM CHLORIDE 0.9 % IV SOLN
200.0000 mg | Freq: Once | INTRAVENOUS | Status: AC
  Administered 2021-12-22: 200 mg via INTRAVENOUS
  Filled 2021-12-22: qty 200

## 2021-12-22 MED ORDER — MORPHINE SULFATE (PF) 2 MG/ML IV SOLN
2.0000 mg | INTRAVENOUS | Status: DC | PRN
  Administered 2021-12-22 (×2): 2 mg via INTRAVENOUS
  Filled 2021-12-22 (×2): qty 1

## 2021-12-22 NOTE — Progress Notes (Addendum)
PROGRESS NOTE    Howard [name]  TIW:[id number] DOB: 1931/05/02 DOA: 11/25/2021 PCP: Jerrol Banana., MD    Assessment & Plan:   Principal Problem:   Pneumonia due to COVID-19 virus Active Problems:   Atrioventricular block, complete (HCC)   Type 2 diabetes mellitus without complications ([place])   CAD (coronary artery disease)   Adenocarcinoma ([place])   AAA (abdominal aortic aneurysm) without rupture   CKD stage 3 due to type 2 diabetes mellitus ([place])   Dysphagia   CAP (community acquired pneumonia)   Acute hypoxemic respiratory failure ([place])  Failure to thrive: secondary to all below. Started on comfort care as per pt's family   Multifocal pneumonia: likely secondary to COVID-19 infection. Comfort care only   Acute on chronic hypoxic respiratory failure:  was placed on BiPAP. Comfort care only   Dysphagia/odynophagia:  difficulty to swallow. Eat as tolerated  Hx of splenic infarct: comfort care only   COPD: w/o exacerbation. Comfort care only   DM2: likely poorly controlled. Comfort care only   Chronic diarrhea: comfort care only   Metabolic acidosis: non anion gap. Likely from diarrhea.    Chronic systolic CHF: last echo showed EF of 20 to 25%.   Hx of non-small cell lung cancer: s/p radiation. Management per onco   Normocytic anemia: no need for a transfusion currently  Hx of complete heart block: s/p pacemaker placement  Hx of abdominal aortic aneurysm: comfort care only    DVT prophylaxis: comfort care Code Status: DNR Family Communication: discussed pt's care w/ pt's family and answered their questions  Disposition Plan: likely in hospital passing away   Level of care: Progressive  Status is: Inpatient  Remains inpatient appropriate because: comfort care only     Consultants:    Procedures:   Antimicrobials:   Subjective: Pt c/o chest pain and shortness of breath.   Objective: Vitals:   12/22/21 0430 12/22/21 0500 12/22/21 0530  12/22/21 0600  BP: 138/71 (!) 149/65 (!) 152/87 (!) 147/66  Pulse: 94 93 95 95  Resp:  (!) 21 (!) 21   Temp:      TempSrc:      SpO2: 99% 99% 100% 98%  Weight:      Height:       No intake or output data in the 24 hours ending 12/22/21 0826 Filed Weights   11/24/2021 1453  Weight: 77.1 kg    Examination:  General exam: Appears uncomfortable  Respiratory system: diminished breath sounds  Cardiovascular system: S1 & S2 +.  Gastrointestinal system: Abdomen is nondistended, soft and nontender.  Normal bowel sounds heard. Central nervous system: Alert and awake. Moves all extremities  Psychiatry: Judgement and insight appear normal. Flat mood and affect   Data Reviewed: I have personally reviewed following labs and imaging studies  CBC: Recent Labs  Lab 12/13/2021 1714  WBC 5.5  NEUTROABS 3.6  HGB 10.5*  HCT 34.0*  MCV 88.3  PLT 250   Basic Metabolic Panel: Recent Labs  Lab 12/19/2021 1714 12/21/21 0402  NA 138 138  K 3.8 3.6  CL 112* 113*  CO2 15* 14*  GLUCOSE 138* 143*  BUN 22 20  CREATININE 1.11 1.01  CALCIUM 9.2 8.9   GFR: Estimated Creatinine Clearance: 50.6 mL/min (by C-G formula based on SCr of 1.01 mg/dL). Liver Function Tests: Recent Labs  Lab 12/10/2021 1714 12/21/21 0402  AST 26 24  ALT 14 13  ALKPHOS 58 54  BILITOT 0.8 0.8  PROT  7.6 6.8  ALBUMIN 3.4* 3.1*   No results for input(s): LIPASE, AMYLASE in the last 168 hours. No results for input(s): AMMONIA in the last 168 hours. Coagulation Profile: Recent Labs  Lab 12/21/21 0402  INR 1.3*   Cardiac Enzymes: No results for input(s): CKTOTAL, CKMB, CKMBINDEX, TROPONINI in the last 168 hours. BNP (last 3 results) No results for input(s): PROBNP in the last 8760 hours. HbA1C: No results for input(s): HGBA1C in the last 72 hours. CBG: Recent Labs  Lab 12/21/21 0408 12/21/21 0722 12/21/21 1223 12/21/21 1807 12/22/21 0736  GLUCAP 106* 162* 205* 193* 229*   Lipid Profile: No results for  input(s): CHOL, HDL, LDLCALC, TRIG, CHOLHDL, LDLDIRECT in the last 72 hours. Thyroid Function Tests: No results for input(s): TSH, T4TOTAL, FREET4, T3FREE, THYROIDAB in the last 72 hours. Anemia Panel: No results for input(s): VITAMINB12, FOLATE, FERRITIN, TIBC, IRON, RETICCTPCT in the last 72 hours. Sepsis Labs: Recent Labs  Lab 12/21/21 0402  PROCALCITON <0.10    Recent Results (from the past 240 hour(s))  Resp Panel by RT-PCR (Flu A&B, Covid) Nasopharyngeal Swab     Status: Abnormal   Collection Time: 12/14/21 10:19 PM   Specimen: Nasopharyngeal Swab; Nasopharyngeal(NP) swabs in vial transport medium  Result Value Ref Range Status   SARS Coronavirus 2 by RT PCR POSITIVE (A) NEGATIVE Final    Comment: (NOTE) SARS-CoV-2 target nucleic acids are DETECTED.  The SARS-CoV-2 RNA is generally detectable in upper respiratory specimens during the acute phase of infection. Positive results are indicative of the presence of the identified virus, but do not rule out bacterial infection or co-infection with other pathogens not detected by the test. Clinical correlation with patient history and other diagnostic information is necessary to determine patient infection status. The expected result is Negative.  Fact Sheet for Patients: EntrepreneurPulse.com.au  Fact Sheet for Healthcare Providers: IncredibleEmployment.be  This test is not yet approved or cleared by the [place] FDA and  has been authorized for detection and/or diagnosis of SARS-CoV-2 by FDA under an Emergency Use Authorization (EUA).  This EUA will remain in effect (meaning this test can be used) for the duration of  the COVID-19 declaration under Section 564(b)(1) of the A ct, 21 U.S.C. section 360bbb-3(b)(1), unless the authorization is terminated or revoked sooner.     Influenza A by PCR NEGATIVE NEGATIVE Final   Influenza B by PCR NEGATIVE NEGATIVE Final    Comment:  (NOTE) The Xpert Xpress SARS-CoV-2/FLU/RSV plus assay is intended as an aid in the diagnosis of influenza from Nasopharyngeal swab specimens and should not be used as a sole basis for treatment. Nasal washings and aspirates are unacceptable for Xpert Xpress SARS-CoV-2/FLU/RSV testing.  Fact Sheet for Patients: EntrepreneurPulse.com.au  Fact Sheet for Healthcare Providers: IncredibleEmployment.be  This test is not yet approved or cleared by the [place] FDA and has been authorized for detection and/or diagnosis of SARS-CoV-2 by FDA under an Emergency Use Authorization (EUA). This EUA will remain in effect (meaning this test can be used) for the duration of the COVID-19 declaration under Section 564(b)(1) of the Act, 21 U.S.C. section 360bbb-3(b)(1), unless the authorization is terminated or revoked.  Performed at [facility], [address] [place], [place] [address]   Blood culture (single)     Status: None (Preliminary result)   Collection Time: 12/04/2021  8:16 PM   Specimen: BLOOD  Result Value Ref Range Status   Specimen Description BLOOD BLOOD LEFT FOREARM  Final   Special  Requests   Final    BOTTLES DRAWN AEROBIC AND ANAEROBIC Blood Culture adequate volume   Culture   Final    NO GROWTH 2 DAYS Performed at [facility], [address] [place], [place] [address]    Report Status PENDING  Incomplete  Resp Panel by RT-PCR (Flu A&B, Covid) Nasopharyngeal Swab     Status: Abnormal   Collection Time: 12/14/2021  8:16 PM   Specimen: Nasopharyngeal Swab; Nasopharyngeal(NP) swabs in vial transport medium  Result Value Ref Range Status   SARS Coronavirus 2 by RT PCR POSITIVE (A) NEGATIVE Final    Comment: (NOTE) SARS-CoV-2 target nucleic acids are DETECTED.  The SARS-CoV-2 RNA is generally detectable in upper respiratory specimens during the acute phase of infection. Positive results are indicative of the presence of  the identified virus, but do not rule out bacterial infection or co-infection with other pathogens not detected by the test. Clinical correlation with patient history and other diagnostic information is necessary to determine patient infection status. The expected result is Negative.  Fact Sheet for Patients: EntrepreneurPulse.com.au  Fact Sheet for Healthcare Providers: IncredibleEmployment.be  This test is not yet approved or cleared by the [place] FDA and  has been authorized for detection and/or diagnosis of SARS-CoV-2 by FDA under an Emergency Use Authorization (EUA).  This EUA will remain in effect (meaning this test can be used) for the duration of  the COVID-19 declaration under Section 564(b)(1) of the A ct, 21 U.S.C. section 360bbb-3(b)(1), unless the authorization is terminated or revoked sooner.     Influenza A by PCR NEGATIVE NEGATIVE Final   Influenza B by PCR NEGATIVE NEGATIVE Final    Comment: (NOTE) The Xpert Xpress SARS-CoV-2/FLU/RSV plus assay is intended as an aid in the diagnosis of influenza from Nasopharyngeal swab specimens and should not be used as a sole basis for treatment. Nasal washings and aspirates are unacceptable for Xpert Xpress SARS-CoV-2/FLU/RSV testing.  Fact Sheet for Patients: EntrepreneurPulse.com.au  Fact Sheet for Healthcare Providers: IncredibleEmployment.be  This test is not yet approved or cleared by the [place] FDA and has been authorized for detection and/or diagnosis of SARS-CoV-2 by FDA under an Emergency Use Authorization (EUA). This EUA will remain in effect (meaning this test can be used) for the duration of the COVID-19 declaration under Section 564(b)(1) of the Act, 21 U.S.C. section 360bbb-3(b)(1), unless the authorization is terminated or revoked.  Performed at [facility], [address], [place], [place] [address]           Radiology Studies: DG Chest 1 View  Result Date: 12/21/2021 CLINICAL DATA:  Shortness of breath. EXAM: CHEST  1 VIEW COMPARISON:  December 20, 2021 FINDINGS: Mild to moderate severity infiltrates are seen within the mid right lung, right suprahilar and bilateral infrahilar regions. These are increased in severity when compared to the prior study. There is no evidence of a pleural effusion or pneumothorax. The heart size and mediastinal contours are within normal limits. There is marked severity calcification of the thoracic aorta. Multilevel degenerative changes are seen throughout the thoracic spine. IMPRESSION: Worsening mild to moderate severity bilateral infiltrates, as described above. Electronically Signed   By: Virgina Norfolk M.D.   On: 12/21/2021 03:50   DG Chest 2 View  Result Date: 12/09/2021 CLINICAL DATA:  Hemoptysis.  Persistent and worsening throat pain. EXAM: CHEST - 2 VIEW COMPARISON:  12/15/2021 FINDINGS: Cardiac pacemaker. Slightly shallow inspiration. Heart size and pulmonary vascularity are normal. There is developing  airspace disease in the right mid lung since prior study suggesting developing pneumonia. No pleural effusions. No pneumothorax. Mediastinal contours appear intact. Calcification of the aorta. IMPRESSION: Developing focus of airspace disease in the right mid lung since prior study suggesting pneumonia. Electronically Signed   By: Lucienne Capers M.D.   On: 12/21/2021 17:15   CT Soft Tissue Neck W Contrast  Result Date: 12/05/2021 CLINICAL DATA:  Neck soft tissue swelling.  Sore throat. EXAM: CT NECK WITH CONTRAST TECHNIQUE: Multidetector CT imaging of the neck was performed using the standard protocol following the bolus administration of intravenous contrast. CONTRAST:  27mL OMNIPAQUE IOHEXOL 300 MG/ML  SOLN COMPARISON:  Chest CT 12/04/2021. FINDINGS: PHARYNX AND LARYNX: The nasopharynx, oropharynx and larynx are normal. Visible portions of the oral  cavity, tongue base and floor of mouth are normal. Normal epiglottis, vallecula and pyriform sinuses. The larynx is normal. No retropharyngeal abscess, effusion or lymphadenopathy. SALIVARY GLANDS: Normal parotid, submandibular and sublingual glands. THYROID: Normal. LYMPH NODES: No enlarged or abnormal density lymph nodes. VASCULAR: Calcific aortic atherosclerosis. LIMITED INTRACRANIAL: Normal. VISUALIZED ORBITS: Normal. MASTOIDS AND VISUALIZED PARANASAL SINUSES: No fluid levels or advanced mucosal thickening. No mastoid effusion. SKELETON: No bony spinal canal stenosis. No lytic or blastic lesions. UPPER CHEST: Incompletely visualized paramediastinal opacity in the right upper lobe. This is better characterized on the recent chest CT. OTHER: None. IMPRESSION: 1. No acute abnormality of the neck. 2. Incompletely visualized paramediastinal opacity in the right upper lobe, better characterized on the recent chest CT. Aortic Atherosclerosis (ICD10-I70.0). Electronically Signed   By: Ulyses Jarred M.D.   On: 12/19/2021 22:14   CT CHEST WO CONTRAST  Result Date: 12/21/2021 CLINICAL DATA:  Dyspnea. EXAM: CT CHEST WITHOUT CONTRAST TECHNIQUE: Multidetector CT imaging of the chest was performed following the standard protocol without IV contrast. COMPARISON:  December 04, 2021 FINDINGS: Cardiovascular: A multi lead AICD is seen. There is marked severity calcification of the thoracic aorta, without evidence of aortic aneurysm. There is mild cardiomegaly with marked severity coronary artery calcification. No pericardial effusion. Mediastinum/Nodes: Mild AP window and pretracheal lymphadenopathy is seen. Thyroid gland, trachea, and esophagus demonstrate no significant findings. Lungs/Pleura: Marked severity multifocal infiltrates are seen within the right upper lobe. This is increased in severity when compared to the prior study. A large area of consolidation is again seen within the posterior aspect of the right lower  lobe. This is stable in appearance when compared to the prior study. Mild atelectasis is seen within the posterior aspects of the left upper lobe and left lung base. A stable 2.2 cm partially calcified lung nodule is seen along the left lingula. A partially calcified 2.1 cm x 1.7 cm soft tissue nodule is seen within the anteromedial aspect of the right middle lobe. There is a small right pleural effusion which is decreased in size when compared to the prior study. No pneumothorax is identified. Upper Abdomen: Multiple surgical clips are seen within the gallbladder fossa. Musculoskeletal: Multilevel degenerative changes are seen throughout the thoracic spine. IMPRESSION: 1. Marked severity multifocal right upper lobe infiltrates, increased in severity when compared to the prior study. 2. Large, stable area of posterior right lower lobe consolidation. Follow-up to resolution is recommended to exclude the presence of an underlying neoplasm. 3. Stable partially calcified lung nodules within the left lingula and right middle lobe. 4. Small right pleural effusion, decreased in size when compared to the prior study. 5. Mild cardiomegaly with marked severity coronary artery calcification. 6. Evidence of  prior cholecystectomy. Aortic Atherosclerosis (ICD10-I70.0). Electronically Signed   By: Virgina Norfolk M.D.   On: 12/21/2021 00:42        Scheduled Meds:  apixaban  5 mg Oral BID   fluticasone furoate-vilanterol  1 puff Inhalation Daily   And   umeclidinium bromide  1 puff Inhalation Daily   insulin aspart  0-9 Units Subcutaneous TID WC   isosorbide mononitrate  60 mg Oral Daily   methylPREDNISolone (SOLU-MEDROL) injection  40 mg Intravenous Q12H   metoprolol succinate  25 mg Oral Daily   sacubitril-valsartan  1 tablet Oral BID   sertraline  50 mg Oral Daily   spironolactone  12.5 mg Oral Daily   Continuous Infusions:  cefTRIAXone (ROCEPHIN)  IV Stopped (12/21/21 3704)   doxycycline (VIBRAMYCIN) IV  Stopped (12/22/21 0728)     LOS: 2 days    Time spent: 25 mins     Wyvonnia Dusky, MD [facility] Pager [phone]  If 7PM-7AM, please contact night-coverage 12/22/2021, 8:26 AM

## 2021-12-22 NOTE — Progress Notes (Signed)
SLP Cancellation Note  Patient Details Name: Howard [name] MRN: [id number] DOB: 03/10/1931   Cancelled treatment:       Reason Eval/Treat Not Completed: Patient not medically ready;Medical issues which prohibited therapy Remains on BiPAP, dw RN.   Deneise Lever, [place], CCC-SLP Speech-Language Pathologist    Aliene Altes 12/22/2021, 10:15 AM

## 2021-12-22 NOTE — ED Notes (Signed)
RN to bedside to introduce self to pt. Pt resting with family at bedside

## 2021-12-22 NOTE — ED Notes (Signed)
Pt resting in bed. Appears to be sleeping at this time. Chest is rising and falling symmetrically. No acute distress noted. Will continue to monitor.   Family remains at bedside.

## 2021-12-22 NOTE — ED Notes (Signed)
Pt was complaining of CP after RT attempted to remove BIPAP. RR increased. MD notified.

## 2021-12-22 NOTE — ED Notes (Signed)
Per MD. Remove pt from BIPAP. Pt to go into comfort care.

## 2021-12-22 NOTE — Progress Notes (Signed)
Remdesivir - Pharmacy Brief Note   O:   ALT: WNL CXR: Worsening mild to moderate severity bilateral infiltrates SpO2: 98%    A/P: start remdesivir 200 mg IVPB once followed by 100 mg IVPB daily x 4 days  Vallery Sa, PharmD 12/22/2021 8:39 AM

## 2021-12-22 NOTE — ED Notes (Signed)
Pt resting in bed. Appears to be sleeping at this time. Chest is rising and falling symmetrically. No acute distress noted. Will continue to monitor.

## 2021-12-22 NOTE — Progress Notes (Signed)
Trial to see if patient could tolerate removal of BIPAP and placed on [place].  Within two minutes HR increased to 143 and RR into the high 30's. Patient  began to complain of having pain across top of chest.  Patient placed  back on BIPAP and work of breathing became less but stated he was still having some pain at top of chest.  Patient RN informed.

## 2021-12-22 NOTE — Progress Notes (Signed)
Sent message to pharmacy at 1535 to bring up morphine to start morphine drip. Follow up message sent at 1720, as medication has still not been received. Charge RN went down to pharmacy at 1725 and was told medication would be delivered to floor and was still not ready. RN will continue to follow up on obtaining medication. MD notified about medication delay.

## 2021-12-23 DIAGNOSIS — J9621 Acute and chronic respiratory failure with hypoxia: Secondary | ICD-10-CM | POA: Diagnosis not present

## 2021-12-23 DIAGNOSIS — R627 Adult failure to thrive: Secondary | ICD-10-CM | POA: Diagnosis not present

## 2021-12-23 DIAGNOSIS — U071 COVID-19: Secondary | ICD-10-CM | POA: Diagnosis not present

## 2021-12-23 DIAGNOSIS — J1282 Pneumonia due to coronavirus disease 2019: Secondary | ICD-10-CM | POA: Diagnosis not present

## 2021-12-23 NOTE — TOC CM/SW Note (Signed)
Completed chart review. Patient is on comfort care. Please consult TOC if TOC related needs arise.  Howard [name], [place]

## 2021-12-23 NOTE — Progress Notes (Signed)
PROGRESS NOTE    Howard [name]  ZOX:[id number] DOB: 1931/09/19 DOA: 12/19/2021 PCP: Jerrol Banana., MD    Assessment & Plan:   Principal Problem:   Pneumonia due to COVID-19 virus Active Problems:   Atrioventricular block, complete (HCC)   Type 2 diabetes mellitus without complications ([place])   CAD (coronary artery disease)   Adenocarcinoma ([place])   AAA (abdominal aortic aneurysm) without rupture   CKD stage 3 due to type 2 diabetes mellitus ([place])   Dysphagia   CAP (community acquired pneumonia)   Acute hypoxemic respiratory failure ([place])   COVID-19   Pressure injury of skin  Failure to thrive: secondary to all below. Continue w/ comfort care. Titrating up IV morphine drip to improve comfort   Multifocal pneumonia: likely secondary to COVID-19 infection. Comfort care only   Acute on chronic hypoxic respiratory failure: on [place] only. Comfort care only   Dysphagia/odynophagia:  difficulty to swallow. Eat as tolerated  Hx of splenic infarct: comfort care only   COPD: w/o exacerbation. Comfort care only   DM2: likely poorly controlled. Comfort care only   Chronic diarrhea: comfort care only   Metabolic acidosis: non anion gap. Likely from diarrhea.    Chronic systolic CHF: last echo showed EF of 20 to 25%.   Hx of non-small cell lung cancer: s/p radiation. Management per onco   Normocytic anemia: no need for a transfusion currently  Hx of complete heart block: s/p pacemaker placement  Hx of abdominal aortic aneurysm: comfort care only    DVT prophylaxis: comfort care Code Status: DNR Family Communication: discussed pt's care w/ pt's family and answered their questions  Disposition Plan: likely in hospital death   Level of care: Med-Surg  Status is: Inpatient  Remains inpatient appropriate because: comfort care only, requiring IV morphine drip     Consultants:    Procedures:   Antimicrobials:   Subjective: Pt is obtunded    Objective: Vitals:   12/22/21 1100 12/22/21 1130 12/22/21 1200 12/22/21 1956  BP: (!) 148/83 (!) 162/110 (!) 145/112 124/70  Pulse: (!) 51 (!) 35 (!) 53 (!) 110  Resp: (!) 29 (!) 34 (!) 34 20  Temp: 98 F (36.7 C)  98 F (36.7 C) 98.3 F (36.8 C)  TempSrc: Oral  Oral   SpO2: 95% 93% (!) 84% 90%  Weight:      Height:        Intake/Output Summary (Last 24 hours) at 12/23/2021 0750 Last data filed at 12/23/2021 0348 Gross per 24 hour  Intake 10.05 ml  Output --  Net 10.05 ml   Filed Weights   12/14/2021 1453  Weight: 77.1 kg    Examination:  General exam: Appears obtunded  Respiratory system: decreased breath sounds b/l  Cardiovascular system: S1/S2+. No clicks  Gastrointestinal system: Abd is soft, NT, ND & hypoactive bowel sounds  Central nervous system: Obtunded.  Psychiatry: judgement and insight appears poor    Data Reviewed: I have personally reviewed following labs and imaging studies  CBC: Recent Labs  Lab 12/18/2021 1714 12/22/21 0743  WBC 5.5 4.3  NEUTROABS 3.6 3.7  HGB 10.5* 8.8*  HCT 34.0* 27.1*  MCV 88.3 84.7  PLT 284 811   Basic Metabolic Panel: Recent Labs  Lab 12/05/2021 1714 12/21/21 0402 12/22/21 0743  NA 138 138 142  K 3.8 3.6 4.0  CL 112* 113* 115*  CO2 15* 14* 13*  GLUCOSE 138* 143* 238*  BUN 22 20 38*  CREATININE 1.11  1.01 1.32*  CALCIUM 9.2 8.9 8.9   GFR: Estimated Creatinine Clearance: 38.7 mL/min (A) (by C-G formula based on SCr of 1.32 mg/dL (H)). Liver Function Tests: Recent Labs  Lab 11/28/2021 1714 12/21/21 0402 12/22/21 0743  AST 26 24 19   ALT 14 13 18   ALKPHOS 58 54 46  BILITOT 0.8 0.8 0.7  PROT 7.6 6.8 6.7  ALBUMIN 3.4* 3.1* 2.9*   No results for input(s): LIPASE, AMYLASE in the last 168 hours. No results for input(s): AMMONIA in the last 168 hours. Coagulation Profile: Recent Labs  Lab 12/21/21 0402  INR 1.3*   Cardiac Enzymes: No results for input(s): CKTOTAL, CKMB, CKMBINDEX, TROPONINI in the last 168  hours. BNP (last 3 results) No results for input(s): PROBNP in the last 8760 hours. HbA1C: No results for input(s): HGBA1C in the last 72 hours. CBG: Recent Labs  Lab 12/21/21 0408 12/21/21 0722 12/21/21 1223 12/21/21 1807 12/22/21 0736  GLUCAP 106* 162* 205* 193* 229*   Lipid Profile: No results for input(s): CHOL, HDL, LDLCALC, TRIG, CHOLHDL, LDLDIRECT in the last 72 hours. Thyroid Function Tests: No results for input(s): TSH, T4TOTAL, FREET4, T3FREE, THYROIDAB in the last 72 hours. Anemia Panel: No results for input(s): VITAMINB12, FOLATE, FERRITIN, TIBC, IRON, RETICCTPCT in the last 72 hours. Sepsis Labs: Recent Labs  Lab 12/21/21 0402  PROCALCITON <0.10    Recent Results (from the past 240 hour(s))  Resp Panel by RT-PCR (Flu A&B, Covid) Nasopharyngeal Swab     Status: Abnormal   Collection Time: 12/14/21 10:19 PM   Specimen: Nasopharyngeal Swab; Nasopharyngeal(NP) swabs in vial transport medium  Result Value Ref Range Status   SARS Coronavirus 2 by RT PCR POSITIVE (A) NEGATIVE Final    Comment: (NOTE) SARS-CoV-2 target nucleic acids are DETECTED.  The SARS-CoV-2 RNA is generally detectable in upper respiratory specimens during the acute phase of infection. Positive results are indicative of the presence of the identified virus, but do not rule out bacterial infection or co-infection with other pathogens not detected by the test. Clinical correlation with patient history and other diagnostic information is necessary to determine patient infection status. The expected result is Negative.  Fact Sheet for Patients: EntrepreneurPulse.com.au  Fact Sheet for Healthcare Providers: IncredibleEmployment.be  This test is not yet approved or cleared by the [place] FDA and  has been authorized for detection and/or diagnosis of SARS-CoV-2 by FDA under an Emergency Use Authorization (EUA).  This EUA will remain in effect (meaning  this test can be used) for the duration of  the COVID-19 declaration under Section 564(b)(1) of the A ct, 21 U.S.C. section 360bbb-3(b)(1), unless the authorization is terminated or revoked sooner.     Influenza A by PCR NEGATIVE NEGATIVE Final   Influenza B by PCR NEGATIVE NEGATIVE Final    Comment: (NOTE) The Xpert Xpress SARS-CoV-2/FLU/RSV plus assay is intended as an aid in the diagnosis of influenza from Nasopharyngeal swab specimens and should not be used as a sole basis for treatment. Nasal washings and aspirates are unacceptable for Xpert Xpress SARS-CoV-2/FLU/RSV testing.  Fact Sheet for Patients: EntrepreneurPulse.com.au  Fact Sheet for Healthcare Providers: IncredibleEmployment.be  This test is not yet approved or cleared by the [place] FDA and has been authorized for detection and/or diagnosis of SARS-CoV-2 by FDA under an Emergency Use Authorization (EUA). This EUA will remain in effect (meaning this test can be used) for the duration of the COVID-19 declaration under Section 564(b)(1) of the Act, 21 U.S.C. section 360bbb-3(b)(1), unless  the authorization is terminated or revoked.  Performed at [facility], [address] [place], [place] [address]   Blood culture (single)     Status: None (Preliminary result)   Collection Time: 11/25/2021  8:16 PM   Specimen: BLOOD  Result Value Ref Range Status   Specimen Description BLOOD BLOOD LEFT FOREARM  Final   Special Requests   Final    BOTTLES DRAWN AEROBIC AND ANAEROBIC Blood Culture adequate volume   Culture   Final    NO GROWTH 3 DAYS Performed at [facility], [address] [place], [place] [address]    Report Status PENDING  Incomplete  Resp Panel by RT-PCR (Flu A&B, Covid) Nasopharyngeal Swab     Status: Abnormal   Collection Time: 12/16/2021  8:16 PM   Specimen: Nasopharyngeal Swab; Nasopharyngeal(NP) swabs in vial transport medium  Result Value  Ref Range Status   SARS Coronavirus 2 by RT PCR POSITIVE (A) NEGATIVE Final    Comment: (NOTE) SARS-CoV-2 target nucleic acids are DETECTED.  The SARS-CoV-2 RNA is generally detectable in upper respiratory specimens during the acute phase of infection. Positive results are indicative of the presence of the identified virus, but do not rule out bacterial infection or co-infection with other pathogens not detected by the test. Clinical correlation with patient history and other diagnostic information is necessary to determine patient infection status. The expected result is Negative.  Fact Sheet for Patients: EntrepreneurPulse.com.au  Fact Sheet for Healthcare Providers: IncredibleEmployment.be  This test is not yet approved or cleared by the [place] FDA and  has been authorized for detection and/or diagnosis of SARS-CoV-2 by FDA under an Emergency Use Authorization (EUA).  This EUA will remain in effect (meaning this test can be used) for the duration of  the COVID-19 declaration under Section 564(b)(1) of the A ct, 21 U.S.C. section 360bbb-3(b)(1), unless the authorization is terminated or revoked sooner.     Influenza A by PCR NEGATIVE NEGATIVE Final   Influenza B by PCR NEGATIVE NEGATIVE Final    Comment: (NOTE) The Xpert Xpress SARS-CoV-2/FLU/RSV plus assay is intended as an aid in the diagnosis of influenza from Nasopharyngeal swab specimens and should not be used as a sole basis for treatment. Nasal washings and aspirates are unacceptable for Xpert Xpress SARS-CoV-2/FLU/RSV testing.  Fact Sheet for Patients: EntrepreneurPulse.com.au  Fact Sheet for Healthcare Providers: IncredibleEmployment.be  This test is not yet approved or cleared by the [place] FDA and has been authorized for detection and/or diagnosis of SARS-CoV-2 by FDA under an Emergency Use Authorization (EUA). This EUA will  remain in effect (meaning this test can be used) for the duration of the COVID-19 declaration under Section 564(b)(1) of the Act, 21 U.S.C. section 360bbb-3(b)(1), unless the authorization is terminated or revoked.  Performed at [facility], [address] [place], [place] [address]          Radiology Studies: No results found.      Scheduled Meds:   Continuous Infusions:  morphine 1 mg/hr (12/23/21 0348)     LOS: 3 days    Time spent: 20 mins     Wyvonnia Dusky, MD [facility] Pager [phone]  If 7PM-7AM, please contact night-coverage 12/23/2021, 7:50 AM

## 2021-12-23 DEATH — deceased

## 2021-12-24 DIAGNOSIS — Z515 Encounter for palliative care: Secondary | ICD-10-CM | POA: Diagnosis not present

## 2021-12-24 DIAGNOSIS — U071 COVID-19: Secondary | ICD-10-CM | POA: Diagnosis not present

## 2021-12-24 DIAGNOSIS — E1169 Type 2 diabetes mellitus with other specified complication: Secondary | ICD-10-CM

## 2021-12-24 DIAGNOSIS — J9601 Acute respiratory failure with hypoxia: Secondary | ICD-10-CM

## 2021-12-24 DIAGNOSIS — J1282 Pneumonia due to coronavirus disease 2019: Secondary | ICD-10-CM | POA: Diagnosis not present

## 2021-12-24 DIAGNOSIS — Z66 Do not resuscitate: Secondary | ICD-10-CM

## 2021-12-24 MED ORDER — MORPHINE BOLUS VIA INFUSION
5.0000 mg | INTRAVENOUS | Status: DC | PRN
  Filled 2021-12-24: qty 5

## 2021-12-24 MED ORDER — LORAZEPAM 2 MG/ML IJ SOLN
1.0000 mg | INTRAMUSCULAR | Status: DC
  Administered 2021-12-24 – 2021-12-25 (×5): 1 mg via INTRAVENOUS
  Filled 2021-12-24 (×5): qty 1

## 2021-12-24 NOTE — Care Management Important Message (Signed)
Important Message  Patient Details  Name: CALDWELL KRONENBERGER MRN: [id number] Date of Birth: [birth year]   Medicare Important Message Given:  Other (see comment)  Patient is on comfort care and awaiting a Hospice consult. Out of respect for the patient and family no Important Message from [facility] given.    Juliann Pulse A Damarian Priola 12/24/2021, 11:50 AM

## 2021-12-24 NOTE — Plan of Care (Signed)
°  Problem: Activity: Goal: Ability to tolerate increased activity will improve Outcome: Not Progressing   Problem: Clinical Measurements: Goal: Ability to maintain a body temperature in the normal range will improve Outcome: Not Progressing   Problem: Respiratory: Goal: Ability to maintain adequate ventilation will improve Outcome: Not Progressing Goal: Ability to maintain a clear airway will improve Outcome: Not Progressing

## 2021-12-24 NOTE — Consult Note (Signed)
Consultation Note Date: 12/24/2021   Patient Name: Howard [name]  DOB: 09-14-1931  MRN: [id number]  Age / Sex: 88 y.o., male  PCP: Jerrol Banana., MD Referring Physician: Wyvonnia Dusky, MD  Reason for Consultation: Non pain symptom management, Pain control, Psychosocial/spiritual support, Terminal Care, and Withdrawal of life-sustaining treatment  HPI/Patient Profile: 88 y.o. male  with past medical history of nonobstructive CAD, chronic systolic heart failure last EF measured was 20 to 25%, splenic infarct on Eliquis, diabetes mellitus type 2, dementia, complete heart block status post pacemaker placement, and history of non-small cell lung cancer status post radiation admitted on 11/22/2021 with covid pneumonia and respiratory failure. Also with FTT.  Family elected comfort measures only. PMT consulted for assistance with comfort measures.   Clinical Assessment and Goals of Care: I have reviewed medical records including EPIC notes, labs and imaging, received report from Dr. Jimmye Norman and hospice liaison, assessed the patient and then met with patient's family (spouse and son)   to discuss diagnosis prognosis, GOC, EOL wishes, disposition and options.  I introduced Palliative Medicine as specialized medical care for people living with serious illness. It focuses on providing relief from the symptoms and stress of a serious illness. The goal is to improve quality of life for both the patient and the family.   We discussed patient's current illness and what it means in the larger context of patient's on-going co-morbidities.  Natural disease trajectory and expectations at EOL were discussed. Family understands patient is in dying process and requests patient pass comfortably. Family concerned about symptom management. We discussed continuing morphine infusion with boluses available as needed. We discussed scheduling ativan so that patient may  receive regularly and ensure comfort. We discussed freeing patient from nasal cannula as it is not adding to comfort at this point and may prolong dying process - family agrees.   Assessed patient 2x throughout day to assess control of symptoms and discuss plan with family  Questions and concerns were addressed. The family was encouraged to call with questions or concerns.   Primary Decision Maker NEXT OF KIN spouse   SUMMARY OF RECOMMENDATIONS   - continue comfort measures - dc nasal cannula as not adding to comfort - increase PRN bolus available from morphine infusion - schedule ativan q4hr - family educated on expectations and likely prognosis  Code Status/Advance Care Planning: DNR  Prognosis:  Hours - Days  Discharge Planning: Anticipated Hospital Death      Primary Diagnoses: Present on Admission:  Pneumonia due to COVID-19 virus  AAA (abdominal aortic aneurysm) without rupture  Adenocarcinoma (HCC)  Atrioventricular block, complete (HCC)  CAD (coronary artery disease)  CKD stage 3 due to type 2 diabetes mellitus ([place])  CAP (community acquired pneumonia)  Acute hypoxemic respiratory failure ([place])  COVID-19   I have reviewed the medical record, interviewed the patient and family, and examined the patient. The following aspects are pertinent.  Past Medical History:  Diagnosis Date   Arthritis    Bell palsy    Bell's palsy 04/12/2015   Cancer [facility])    prostate and skin   Chronic combined systolic and diastolic CHF, NYHA class 1 ([place])    a. 07/2014 Echo: EF 35-40%, Gr 1 DD; b. 03/2021 Echo: EF 20-25%; c. 10/2021 Echo: EF 20-25%, glob HK, GrII DD, nl RV fxn, mod LAE, mild to mod MR, mild-mod AoV scelrosis, Ao root 12mm.   Complete heart block ([place])    a. 11/2010 s/p SJM  2210 [employer] DC PPM, ser# [id number].   Depression    Diabetes mellitus without complication [facility])    Fall 11/10/2014   GERD (gastroesophageal reflux disease)    History of prostate cancer     Hyperlipidemia    Hypertension    LBBB (left bundle branch block)    Left-sided Bell's palsy    Lung cancer (HCC) 2016   NICM (nonischemic cardiomyopathy) (HCC)    a. 07/2014 Echo: EF 35-40%, Gr 1 DD; b. 03/2021 Echo: EF 20-25%; c. 03/2021 Cath:  d. 10/2021 Echo: EF 20-25%, glob HK, GrII DD.   Non-obstructive CAD    a. 07/2014 Abnl MV;  b. 08/2014 Cath: LM nl, LAD 30p, RI 40p, LCX nl, OM1 40, RCA dominant 30p, 70d-->Med Rx; c. 03/2021 Cath: LM nl, LAD 20p/m, LCX 30ost/p, OM1 40, OM2 60, RCA 30p, 50d. RHC w/ minimally elevated filling pressures.   Poor balance    Presence of permanent cardiac pacemaker    Sleep apnea    a. cpap   Vertigo    WPW (Wolff-Parkinson-White syndrome)    a. S/P RFCA 1991.   Social History   Socioeconomic History   Marital status: Married    Spouse name: Not on file   Number of children: 2   Years of education: College   Highest education level: Some college, no degree  Occupational History   Occupation: Retired  Tobacco Use   Smoking status: Former    Packs/day: 1.00    Years: 56.00    Pack years: 56.00    Types: Cigarettes    Quit date: 2011    Years since quitting: 12.0   Smokeless tobacco: Never  Vaping Use   Vaping Use: Never used  Substance and Sexual Activity   Alcohol use: No   Drug use: No   Sexual activity: Not on file  Other Topics Concern   Not on file  Social History Narrative   Drinks 2 cups of coffee a day    Social Determinants of Corporate investment banker Strain: Not on file  Food Insecurity: No Food Insecurity   Worried About Programme researcher, broadcasting/film/video in the Last Year: Never true   Barista in the Last Year: Never true  Transportation Needs: No Transportation Needs   Lack of Transportation (Medical): No   Lack of Transportation (Non-Medical): No  Physical Activity: Not on file  Stress: Not on file  Social Connections: Not on file   Family History  Problem Relation Age of Onset   Heart attack Mother    Hyperlipidemia  Mother    CAD Other    Prostate cancer Neg Hx    Cancer Neg Hx    Scheduled Meds:  LORazepam  1 mg Intravenous Q4H   Continuous Infusions:  morphine 10 mg/hr (12/24/21 1053)   PRN Meds:.acetaminophen **OR** acetaminophen, glycopyrrolate **OR** glycopyrrolate **OR** glycopyrrolate, haloperidol **OR** haloperidol **OR** haloperidol lactate, ipratropium-albuterol, LORazepam **OR** LORazepam **OR** LORazepam, morphine Allergies  Allergen Reactions   Sulfa Antibiotics Rash   Review of Systems  Unable to perform ROS: Patient unresponsive   Physical Exam Constitutional:      General: He is not in acute distress.    Comments: unresponsive  Pulmonary:     Effort: Pulmonary effort is normal.  Skin:    General: Skin is warm and dry.    Vital Signs: BP (!) 105/40 (BP Location: Left Arm)    Pulse 70    Temp 98.8 F (37.1 C)  Resp 12    Ht $R'5\' 9"'JL$  (1.753 m)    Wt 77.1 kg    SpO2 94%    BMI 25.10 kg/m  Pain Scale: Faces   Pain Score: 5    SpO2: SpO2: 94 % O2 Device:SpO2: 94 % O2 Flow Rate: .O2 Flow Rate (L/min): 3 L/min  IO: Intake/output summary: No intake or output data in the 24 hours ending 12/24/21 1451  LBM: Last BM Date: 12/21/21 Baseline Weight: Weight: 77.1 kg Most recent weight: Weight: 77.1 kg     Palliative Assessment/Data: PPS 10%   Time Total: 45 minutes Greater than 50%  of this time was spent counseling and coordinating care related to the above assessment and plan.  Juel Burrow, DNP, AGNP-C Palliative Medicine Team [phone] Pager: [phone]

## 2021-12-24 NOTE — Progress Notes (Signed)
[facility]) Hospital Liaison Note  Received request from Transitions of Care Manager Bronson Ing, RN for family interest in [place]. Visited patient at bedside and spoke with wife and son to confirm interest and explain services. The family states that they do not want patient moved to the Hospice Home. They would like patient to remain at the hospital to pass. Family also requests morphine be increased for patient's comfort. Notified MD and discussed possible inpatient palliative consult for symptom management. Hospice Home referral closed at this time.  Please do not hesitate to call with any hospice related questions.    Thank you for the opportunity to participate in this patient's care.   Bobbie "Loren Racer, RN, BSN [facility] Liaison [phone]

## 2021-12-24 NOTE — Progress Notes (Signed)
PROGRESS NOTE    Howard [name]  UUV:[id number] DOB: 1931/07/10 DOA: 12/03/2021 PCP: Jerrol Banana., MD    Assessment & Plan:   Principal Problem:   Pneumonia due to COVID-19 virus Active Problems:   Atrioventricular block, complete (HCC)   Type 2 diabetes mellitus without complications ([place])   CAD (coronary artery disease)   Adenocarcinoma ([place])   AAA (abdominal aortic aneurysm) without rupture   CKD stage 3 due to type 2 diabetes mellitus ([place])   Dysphagia   CAP (community acquired pneumonia)   Acute hypoxemic respiratory failure ([place])   COVID-19   Pressure injury of skin  Failure to thrive: secondary to all below. Continue w/ comfort care. Increased morphine drip again today to improve comfort. Hospice & palliative care consulted   Multifocal pneumonia: likely secondary to COVID-19 infection. Comfort care only   Acute on chronic hypoxic respiratory failure: on [place] only. Comfort care only   Dysphagia/odynophagia:  difficulty to swallow. Eat as tolerated  Hx of splenic infarct: comfort care only   COPD: w/o exacerbation. Comfort care only   DM2: likely poorly controlled. Comfort care only   Chronic diarrhea: comfort care only   Metabolic acidosis: non anion gap. Likely from diarrhea.    Chronic systolic CHF: last echo showed EF of 20 to 25%.   Hx of non-small cell lung cancer: s/p radiation. Management per onco   Normocytic anemia: no need for a transfusion currently  Hx of complete heart block: s/p pacemaker placement  Hx of abdominal aortic aneurysm: comfort care only    DVT prophylaxis: comfort care Code Status: DNR Family Communication: discussed pt's care w/ pt's family and answered their questions  Disposition Plan: likely in hospital death   Level of care: Med-Surg  Status is: Inpatient  Remains inpatient appropriate because: comfort care only, requiring IV morphine drip     Consultants:    Procedures:   Antimicrobials:    Subjective: Pt is still obtunded   Objective: Vitals:   12/23/21 0931 12/23/21 1415 12/23/21 2105 12/24/21 0735  BP: (!) 141/109  (!) 103/50 (!) 105/40  Pulse: 64  70 70  Resp: 16 (!) 24 12 12   Temp: 98.4 F (36.9 C)  98.2 F (36.8 C) 98.8 F (37.1 C)  TempSrc:      SpO2: 97%  91% 94%  Weight:      Height:       No intake or output data in the 24 hours ending 12/24/21 0738  Filed Weights   11/29/2021 1453  Weight: 77.1 kg    Examination:  General exam: Obtunded   Respiratory system: diminished breath sounds  Cardiovascular system: S1 & S2+.  Gastrointestinal system: Abd is soft, NT, ND & hypoactive bowel sounds  Central nervous system: obtunded  Psychiatry: judgement and insight appear poor    Data Reviewed: I have personally reviewed following labs and imaging studies  CBC: Recent Labs  Lab 11/23/2021 1714 12/22/21 0743  WBC 5.5 4.3  NEUTROABS 3.6 3.7  HGB 10.5* 8.8*  HCT 34.0* 27.1*  MCV 88.3 84.7  PLT 284 474   Basic Metabolic Panel: Recent Labs  Lab 12/01/2021 1714 12/21/21 0402 12/22/21 0743  NA 138 138 142  K 3.8 3.6 4.0  CL 112* 113* 115*  CO2 15* 14* 13*  GLUCOSE 138* 143* 238*  BUN 22 20 38*  CREATININE 1.11 1.01 1.32*  CALCIUM 9.2 8.9 8.9   GFR: Estimated Creatinine Clearance: 38.7 mL/min (A) (by C-G formula based on  SCr of 1.32 mg/dL (H)). Liver Function Tests: Recent Labs  Lab 12/02/2021 1714 12/21/21 0402 12/22/21 0743  AST 26 24 19   ALT 14 13 18   ALKPHOS 58 54 46  BILITOT 0.8 0.8 0.7  PROT 7.6 6.8 6.7  ALBUMIN 3.4* 3.1* 2.9*   No results for input(s): LIPASE, AMYLASE in the last 168 hours. No results for input(s): AMMONIA in the last 168 hours. Coagulation Profile: Recent Labs  Lab 12/21/21 0402  INR 1.3*   Cardiac Enzymes: No results for input(s): CKTOTAL, CKMB, CKMBINDEX, TROPONINI in the last 168 hours. BNP (last 3 results) No results for input(s): PROBNP in the last 8760 hours. HbA1C: No results for input(s):  HGBA1C in the last 72 hours. CBG: Recent Labs  Lab 12/21/21 0408 12/21/21 0722 12/21/21 1223 12/21/21 1807 12/22/21 0736  GLUCAP 106* 162* 205* 193* 229*   Lipid Profile: No results for input(s): CHOL, HDL, LDLCALC, TRIG, CHOLHDL, LDLDIRECT in the last 72 hours. Thyroid Function Tests: No results for input(s): TSH, T4TOTAL, FREET4, T3FREE, THYROIDAB in the last 72 hours. Anemia Panel: No results for input(s): VITAMINB12, FOLATE, FERRITIN, TIBC, IRON, RETICCTPCT in the last 72 hours. Sepsis Labs: Recent Labs  Lab 12/21/21 0402  PROCALCITON <0.10    Recent Results (from the past 240 hour(s))  Resp Panel by RT-PCR (Flu A&B, Covid) Nasopharyngeal Swab     Status: Abnormal   Collection Time: 12/14/21 10:19 PM   Specimen: Nasopharyngeal Swab; Nasopharyngeal(NP) swabs in vial transport medium  Result Value Ref Range Status   SARS Coronavirus 2 by RT PCR POSITIVE (A) NEGATIVE Final    Comment: (NOTE) SARS-CoV-2 target nucleic acids are DETECTED.  The SARS-CoV-2 RNA is generally detectable in upper respiratory specimens during the acute phase of infection. Positive results are indicative of the presence of the identified virus, but do not rule out bacterial infection or co-infection with other pathogens not detected by the test. Clinical correlation with patient history and other diagnostic information is necessary to determine patient infection status. The expected result is Negative.  Fact Sheet for Patients: EntrepreneurPulse.com.au  Fact Sheet for Healthcare Providers: IncredibleEmployment.be  This test is not yet approved or cleared by the [place] FDA and  has been authorized for detection and/or diagnosis of SARS-CoV-2 by FDA under an Emergency Use Authorization (EUA).  This EUA will remain in effect (meaning this test can be used) for the duration of  the COVID-19 declaration under Section 564(b)(1) of the A ct, 21 U.S.C.  section 360bbb-3(b)(1), unless the authorization is terminated or revoked sooner.     Influenza A by PCR NEGATIVE NEGATIVE Final   Influenza B by PCR NEGATIVE NEGATIVE Final    Comment: (NOTE) The Xpert Xpress SARS-CoV-2/FLU/RSV plus assay is intended as an aid in the diagnosis of influenza from Nasopharyngeal swab specimens and should not be used as a sole basis for treatment. Nasal washings and aspirates are unacceptable for Xpert Xpress SARS-CoV-2/FLU/RSV testing.  Fact Sheet for Patients: EntrepreneurPulse.com.au  Fact Sheet for Healthcare Providers: IncredibleEmployment.be  This test is not yet approved or cleared by the [place] FDA and has been authorized for detection and/or diagnosis of SARS-CoV-2 by FDA under an Emergency Use Authorization (EUA). This EUA will remain in effect (meaning this test can be used) for the duration of the COVID-19 declaration under Section 564(b)(1) of the Act, 21 U.S.C. section 360bbb-3(b)(1), unless the authorization is terminated or revoked.  Performed at [facility], [address], [place], [place] [address]  Blood culture (single)     Status: None (Preliminary result)   Collection Time: 12/12/2021  8:16 PM   Specimen: BLOOD  Result Value Ref Range Status   Specimen Description BLOOD BLOOD LEFT FOREARM  Final   Special Requests   Final    BOTTLES DRAWN AEROBIC AND ANAEROBIC Blood Culture adequate volume   Culture   Final    NO GROWTH 3 DAYS Performed at [facility], [address], [place], [place] [address]    Report Status PENDING  Incomplete  Resp Panel by RT-PCR (Flu A&B, Covid) Nasopharyngeal Swab     Status: Abnormal   Collection Time: 11/22/2021  8:16 PM   Specimen: Nasopharyngeal Swab; Nasopharyngeal(NP) swabs in vial transport medium  Result Value Ref Range Status   SARS Coronavirus 2 by RT PCR POSITIVE (A) NEGATIVE Final    Comment: (NOTE) SARS-CoV-2 target  nucleic acids are DETECTED.  The SARS-CoV-2 RNA is generally detectable in upper respiratory specimens during the acute phase of infection. Positive results are indicative of the presence of the identified virus, but do not rule out bacterial infection or co-infection with other pathogens not detected by the test. Clinical correlation with patient history and other diagnostic information is necessary to determine patient infection status. The expected result is Negative.  Fact Sheet for Patients: EntrepreneurPulse.com.au  Fact Sheet for Healthcare Providers: IncredibleEmployment.be  This test is not yet approved or cleared by the [place] FDA and  has been authorized for detection and/or diagnosis of SARS-CoV-2 by FDA under an Emergency Use Authorization (EUA).  This EUA will remain in effect (meaning this test can be used) for the duration of  the COVID-19 declaration under Section 564(b)(1) of the A ct, 21 U.S.C. section 360bbb-3(b)(1), unless the authorization is terminated or revoked sooner.     Influenza A by PCR NEGATIVE NEGATIVE Final   Influenza B by PCR NEGATIVE NEGATIVE Final    Comment: (NOTE) The Xpert Xpress SARS-CoV-2/FLU/RSV plus assay is intended as an aid in the diagnosis of influenza from Nasopharyngeal swab specimens and should not be used as a sole basis for treatment. Nasal washings and aspirates are unacceptable for Xpert Xpress SARS-CoV-2/FLU/RSV testing.  Fact Sheet for Patients: EntrepreneurPulse.com.au  Fact Sheet for Healthcare Providers: IncredibleEmployment.be  This test is not yet approved or cleared by the [place] FDA and has been authorized for detection and/or diagnosis of SARS-CoV-2 by FDA under an Emergency Use Authorization (EUA). This EUA will remain in effect (meaning this test can be used) for the duration of the COVID-19 declaration under Section  564(b)(1) of the Act, 21 U.S.C. section 360bbb-3(b)(1), unless the authorization is terminated or revoked.  Performed at [facility], [address], [place], [place] [address]          Radiology Studies: No results found.      Scheduled Meds:   Continuous Infusions:  morphine 10 mg/hr (12/23/21 2128)     LOS: 4 days    Time spent: 15 mins     Wyvonnia Dusky, MD [facility] Pager [phone]  If 7PM-7AM, please contact night-coverage 12/24/2021, 7:38 AM

## 2021-12-24 NOTE — TOC Progression Note (Signed)
Transition of Care [facility]) - Progression Note    Patient Details  Name: Howard [name] MRN: [id number] Date of Birth: 09-27-1931  Transition of Care [facility] CM/SW [place], RN Phone Number: 12/24/2021, 9:19 AM  Clinical Narrative:    Reached out to Sonia Baller at [facility] come see the comfort care patient, she stated she will come see him         Expected Discharge Plan and Services                                                 Social Determinants of Health (SDOH) Interventions    Readmission Risk Interventions Readmission Risk Prevention Plan 11/07/2021 10/26/2021 09/02/2019  Transportation Screening Complete Complete Complete  PCP or Specialist Appt within 5-7 Days - - -  PCP or Specialist Appt within 3-5 Days - Complete Complete  Home Care Screening - - -  Medication Review (RN CM) - - -  [place] or [place] - Complete Complete  Social Work Consult for [place] Planning/Counseling - Complete Complete  Palliative Care Screening - Not Applicable Not Applicable  Medication Review Press photographer) Complete Complete Complete  PCP or Specialist appointment within 3-5 days of discharge Complete - -  [place] or Home Care Consult Complete - -  SW Recovery Care/Counseling Consult Complete - -  Palliative Care Screening Not Applicable - -  [place] Not Applicable - -  Some recent data might be hidden

## 2021-12-25 ENCOUNTER — Ambulatory Visit: Payer: Medicare Other | Admitting: Family

## 2021-12-25 ENCOUNTER — Inpatient Hospital Stay: Payer: Medicare Other | Admitting: Oncology

## 2021-12-25 ENCOUNTER — Inpatient Hospital Stay: Payer: Medicare Other

## 2021-12-25 DIAGNOSIS — U071 COVID-19: Secondary | ICD-10-CM | POA: Diagnosis not present

## 2021-12-25 DIAGNOSIS — J9601 Acute respiratory failure with hypoxia: Secondary | ICD-10-CM | POA: Diagnosis not present

## 2021-12-25 DIAGNOSIS — J1282 Pneumonia due to coronavirus disease 2019: Secondary | ICD-10-CM | POA: Diagnosis not present

## 2021-12-25 DIAGNOSIS — N1831 Chronic kidney disease, stage 3a: Secondary | ICD-10-CM

## 2021-12-25 LAB — CULTURE, BLOOD (SINGLE)
Culture: NO GROWTH
Special Requests: ADEQUATE

## 2021-12-27 ENCOUNTER — Telehealth: Payer: Medicare Other

## 2021-12-28 ENCOUNTER — Telehealth: Payer: Medicare Other

## 2021-12-31 ENCOUNTER — Telehealth: Payer: Self-pay

## 2021-12-31 ENCOUNTER — Ambulatory Visit: Payer: Medicare Other | Admitting: Family

## 2021-12-31 NOTE — Telephone Encounter (Signed)
Patient wife called to let us know that he passed away 01/01/22. I marked him deceased in paceart and took him out of [facility]. She asked me to let Dr. Caryl Comes know that he passed. I told her I will.

## 2022-01-03 ENCOUNTER — Ambulatory Visit: Payer: Medicare Other

## 2022-01-07 ENCOUNTER — Telehealth: Payer: Medicare Other

## 2022-01-08 ENCOUNTER — Ambulatory Visit: Payer: Medicare Other | Admitting: Oncology

## 2022-01-08 ENCOUNTER — Other Ambulatory Visit: Payer: Medicare Other

## 2022-01-14 ENCOUNTER — Other Ambulatory Visit: Payer: Medicare Other

## 2022-01-21 ENCOUNTER — Ambulatory Visit: Payer: Medicare Other | Admitting: Family

## 2022-01-23 NOTE — Death Summary Note (Signed)
Death Summary  PHU RECORD [id number] DOB: 23-Sep-1931 DOA: December 31, 2021  PCP: Jerrol Banana., MD   Admit date: 12/31/21 Date of Death: 2022/01/05  Final Diagnoses:  Principal Problem:   Pneumonia due to COVID-19 virus Active Problems:   Atrioventricular block, complete (HCC)   Type 2 diabetes mellitus without complications ([place])   CAD (coronary artery disease)   Adenocarcinoma ([place])   AAA (abdominal aortic aneurysm) without rupture   CKD stage 3 due to type 2 diabetes mellitus ([place])   Dysphagia   CAP (community acquired pneumonia)   Acute hypoxemic respiratory failure ([place])   COVID-19   Pressure injury of skin   History of present illness:  HPI was taken from Dr. Hal Hope: Howard [name] is a 88 y.o. male with history of nonobstructive CAD, chronic systolic heart failure last EF measured was 20 to 25%, splenic infarct on Eliquis, diabetes mellitus type 2 last hemoglobin A1c was 8.2, dementia, complete heart block status post pacemaker placement, history of non-small cell lung cancer status post radiation was recently admitted for CHF exacerbation discharged about 2 weeks ago was diagnosed with COVID infection after patient exposed to [place] recently after discharge was having persistent cough over the last 6 days and over the last 24 hours has been having increasing pain and difficulty to swallow because of the pain.  Pain is mostly in the left side of the throat.  Patient has chronic hemoptysis which has gradually worsened over the last few days.   Patient also has benign chronic diarrhea takes Lomotil and Xifaxan.   Patient recently completed a course of Paxlovid for COVID-19 infection last dose was yesterday.   ED Course: In the ER patient had CT neck soft tissue which did not show anything acute but x-ray shows pneumonia.  COVID test is positive.  Patient is presently on 2 L oxygen at the time of my exam.  Patient was empirically started on antibiotics for  community-acquired pneumonia.  I have ordered CT chest which shows multifocal pneumonia.  Hemoglobin is around 10.5 sensitivity troponin is 21.  EKG shows paced rhythm.   As per Dr. Jimmye Norman: Before I even saw the pt, pt's son came up to me and stated that his father had suffered enough and he and his family wanted to make the pt comfort care. Pt was placed on comfort care at that time. Pt unfortunately passed away at 05-Jan-2022 at 5:36 AM.    Hospital Course:  Failure to thrive: secondary to all below. Continue w/ comfort care. Increased morphine drip again today to improve comfort. Hospice & palliative care consulted    Multifocal pneumonia: likely secondary to COVID-19 infection. Comfort care only    Acute on chronic hypoxic respiratory failure: on [place] only. Comfort care only    Dysphagia/odynophagia:  difficulty to swallow. Eat as tolerated   Hx of splenic infarct: comfort care only    COPD: w/o exacerbation. Comfort care only    DM2: likely poorly controlled. Comfort care only    Chronic diarrhea: comfort care only    Metabolic acidosis: non anion gap. Likely from diarrhea.     Chronic systolic CHF: last echo showed EF of 20 to 25%.    Hx of non-small cell lung cancer: s/p radiation. Management per onco    Normocytic anemia: no need for a transfusion currently   Hx of complete heart block: s/p pacemaker placement   Hx of abdominal aortic aneurysm: comfort care only    Time: of death 5:36AM  Time: > 30 mins   Signed:  Wyvonnia Dusky  [facility] 01-11-2022, 5:42 PM

## 2022-01-23 DEATH — deceased

## 2022-02-19 ENCOUNTER — Encounter: Payer: Medicare Other | Admitting: Internal Medicine

## 2022-02-21 ENCOUNTER — Ambulatory Visit: Payer: Medicare Other | Admitting: Family Medicine

## 2022-03-18 ENCOUNTER — Ambulatory Visit: Payer: Medicare Other | Admitting: Cardiovascular Disease

## 2022-04-17 ENCOUNTER — Ambulatory Visit: Payer: Medicare Other | Admitting: Radiation Oncology

## 2022-04-22 IMAGING — CT CT ANGIO CHEST
2 of 6 series · 17 of 46 positions shown · IV contrast (APPLIED)
Comparison: CT chest dated September 12, 2021.

CLINICAL DATA: Acute onset shortness of breath. History of lung
cancer.

EXAM:
CT ANGIOGRAPHY CHEST WITH CONTRAST
TECHNIQUE: Multidetector CT imaging of the chest was performed using the
standard protocol during bolus administration of intravenous
contrast. Multiplanar CT image reconstructions and MIPs were
obtained to evaluate the vascular anatomy.
CONTRAST:  75mL OMNIPAQUE IOHEXOL 350 MG/ML SOLN

[Series 5: thins · axial · 0.76mm/px · z∈[-648,-377]mm · 14 of 371 slices shown]
[im 16/371  lung]
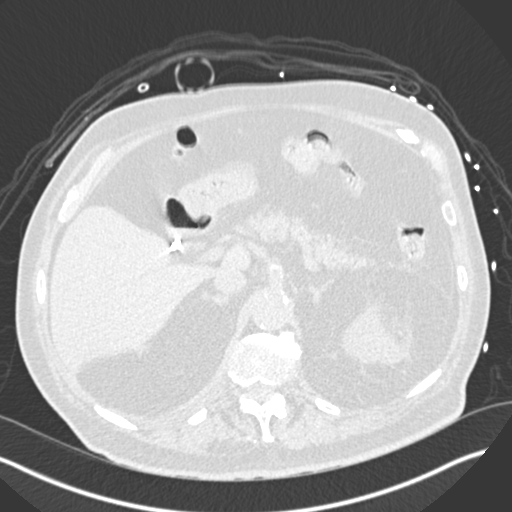
[im 47/371  soft-tissue]
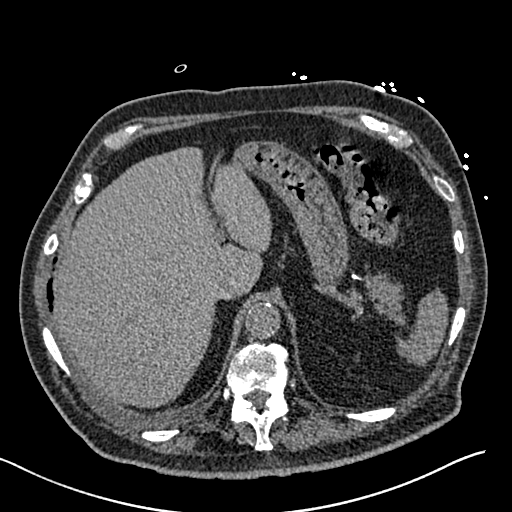
[im 78/371  lung]
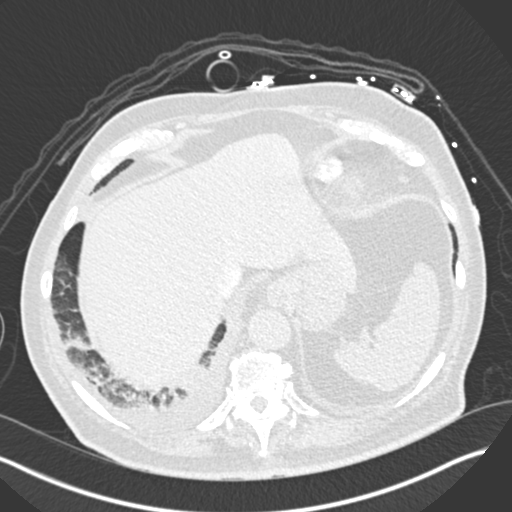
[im 93/371  soft-tissue]
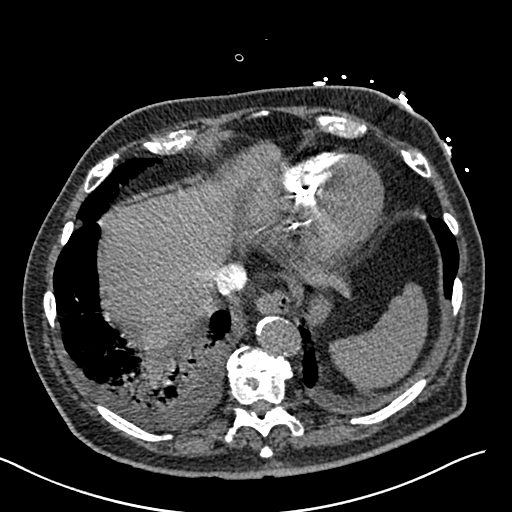
[im 124/371  lung]
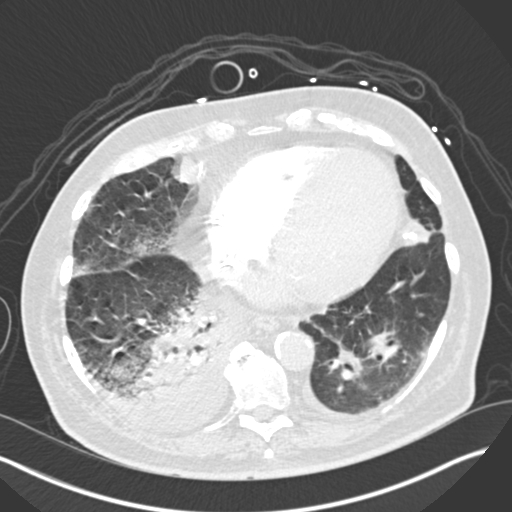
[im 155/371  soft-tissue]
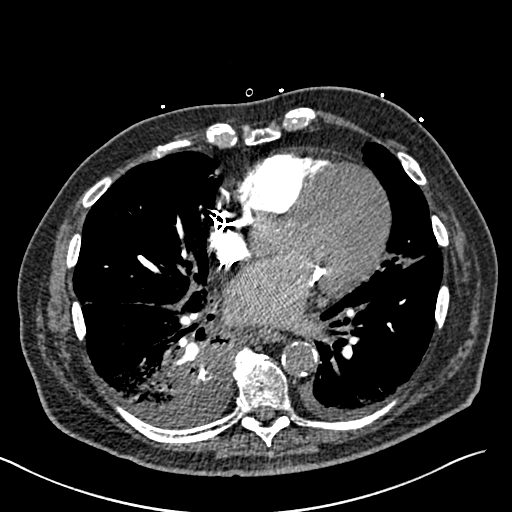
[im 170/371  lung]
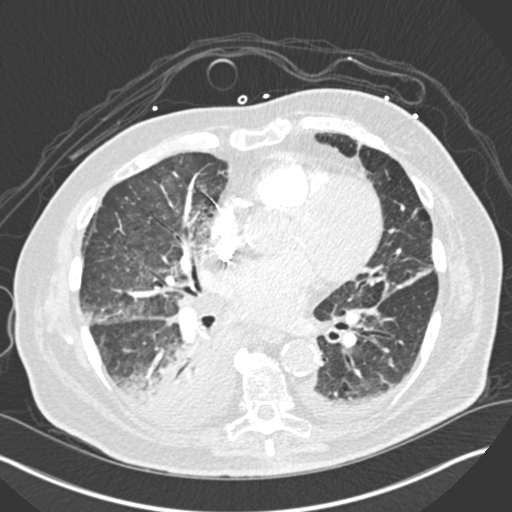
[im 201/371  soft-tissue]
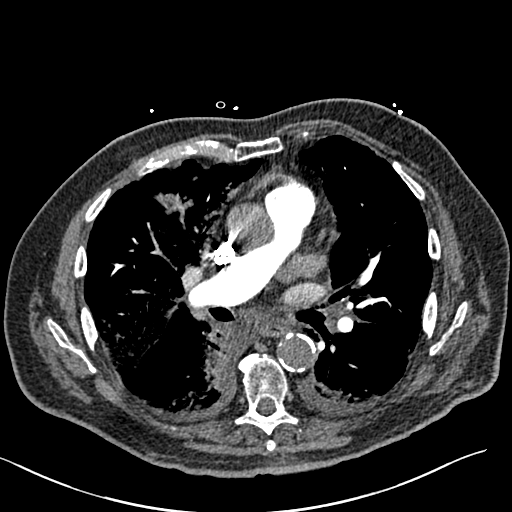
[im 216/371  lung]
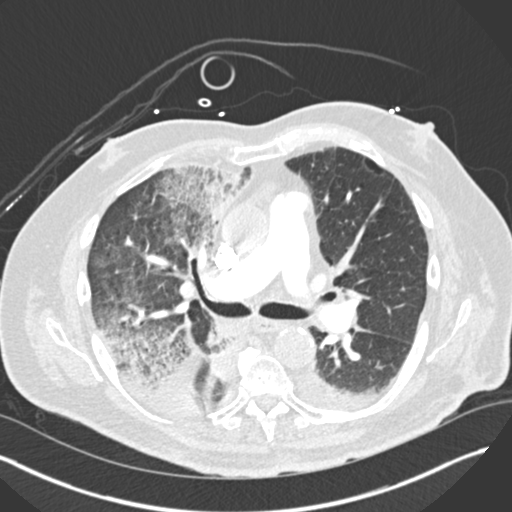
[im 247/371  soft-tissue]
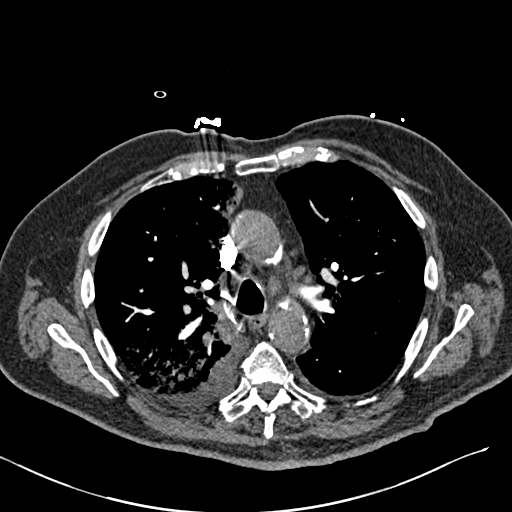
[im 278/371  lung]
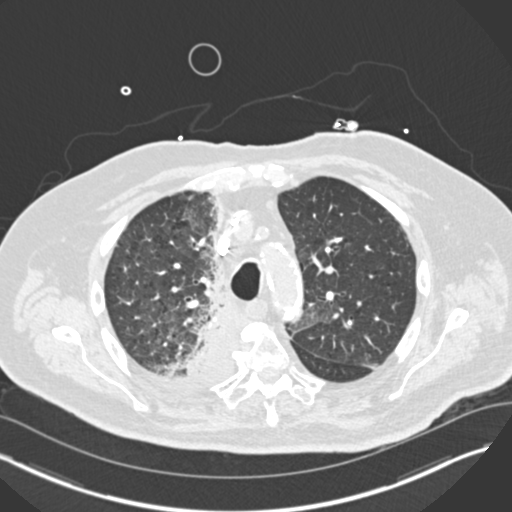
[im 293/371  soft-tissue]
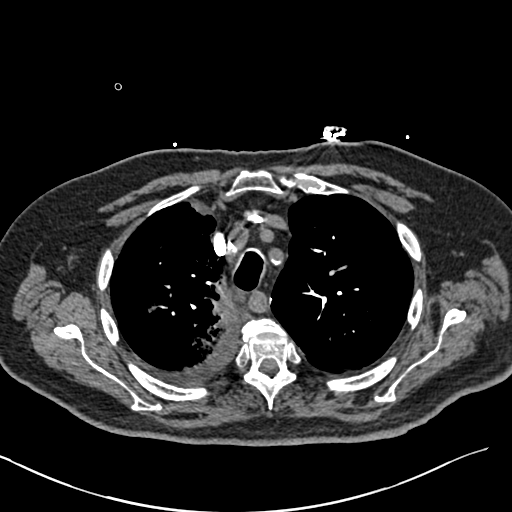
[im 324/371  lung]
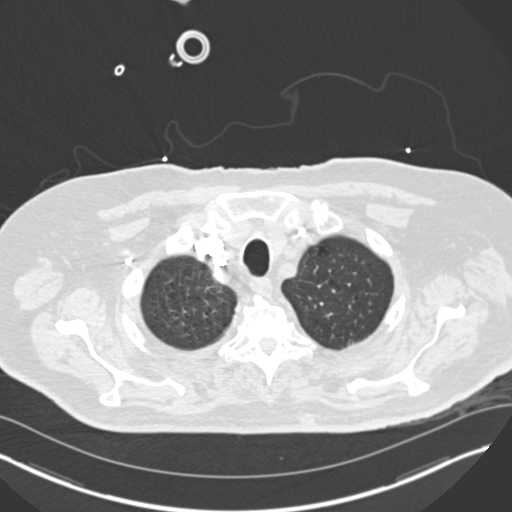
[im 355/371  soft-tissue]
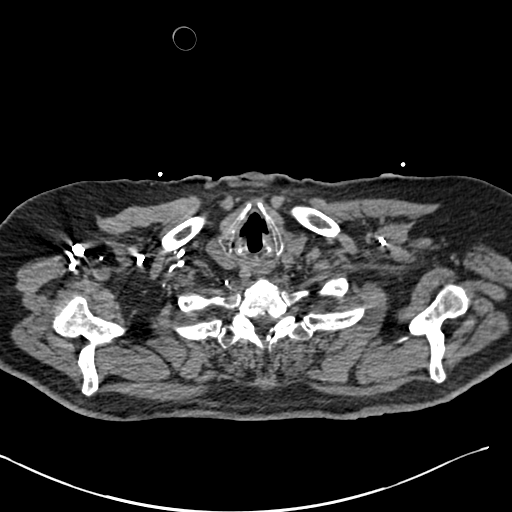

[Series 7: coronal mpr · coronal · 0.58mm/px · 3 of 95 slices shown]
[im 24/95  soft-tissue]
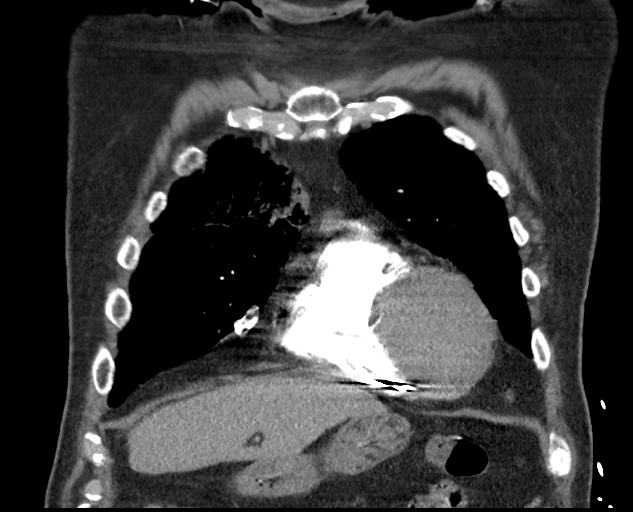
[im 48/95  soft-tissue]
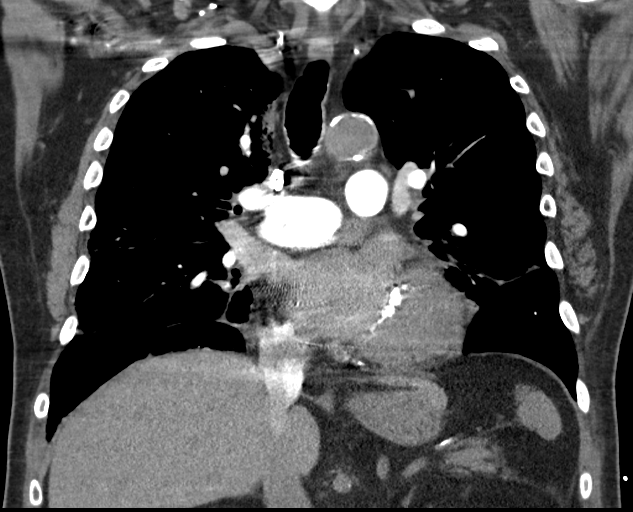
[im 71/95  soft-tissue]
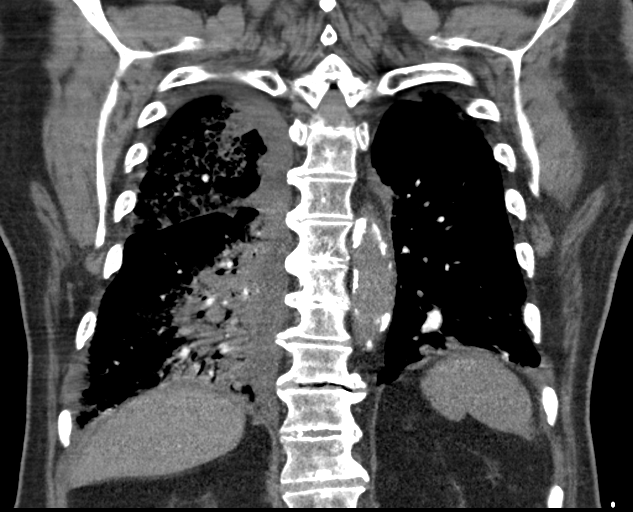

[17 of 46 positions shown; findings below may reference images not displayed]

FINDINGS: Cardiovascular: Satisfactory opacification of the pulmonary arteries
to the segmental level. No evidence of pulmonary embolism. Unchanged
mild cardiomegaly. No pericardial effusion. No thoracic aortic
aneurysm. Coronary, aortic arch, and branch vessel atherosclerotic
vascular disease.

Mediastinum/Nodes: No enlarged mediastinal, hilar, or axillary lymph
nodes. Unchanged index subcarinal lymph node measuring 1.1 cm in
short axis. Thyroid gland, trachea, and esophagus demonstrate no
significant findings.

Lungs/Pleura: New patchy ground-glass densities with areas of
nodular consolidation in the right upper, right middle, and right
lower lobes, with inter- and intralobular septal thickening. Post
treatment related dense fibrotic consolidation bronchiectasis in the
medial right upper and lower lobes has progressed. New small right
and trace left pleural effusions. No pneumothorax. Unchanged
calcified scarring in the lingula and medial right middle lobe. Mild
dependent subsegmental atelectasis in the posterior left lower lobe.
Mild emphysema. New 9 x 8 mm spiculated density in the right upper
lobe (series 6, image 22).

Upper Abdomen: No acute abnormality.

Musculoskeletal: No chest wall abnormality. No acute or significant
osseous findings.

Review of the MIP images confirms the above findings.
IMPRESSION: 1. No evidence of pulmonary embolism.
2. New patchy ground-glass densities with areas of nodular
consolidation in the right upper, right middle, and right lower
lobes, with inter- and intralobular septal thickening. Differential
considerations include multifocal pneumonia, asymmetric pulmonary
edema, and pulmonary hemorrhage.
3. New small right and trace left pleural effusions.
4. New 9 x 8 mm spiculated density in the right upper lobe,
presumably related to underlying acute process in the right lung.
However, attention on follow-up imaging is recommended.
5. Aortic Atherosclerosis (0RUSA-[id number].C) and Emphysema ([id number]-[id number].D).

## 2022-04-23 IMAGING — DX DG CHEST 1V PORT
1 series · 1 of 1 positions shown · non-contrast
Comparison: Chest CT of October 24, 2021 and chest radiograph October 24, 2021.

CLINICAL DATA: 89-year-old male with history of shortness of breath
and lung cancer with recent abnormal imaging.

EXAM:
PORTABLE CHEST 1 VIEW

[chest ap]
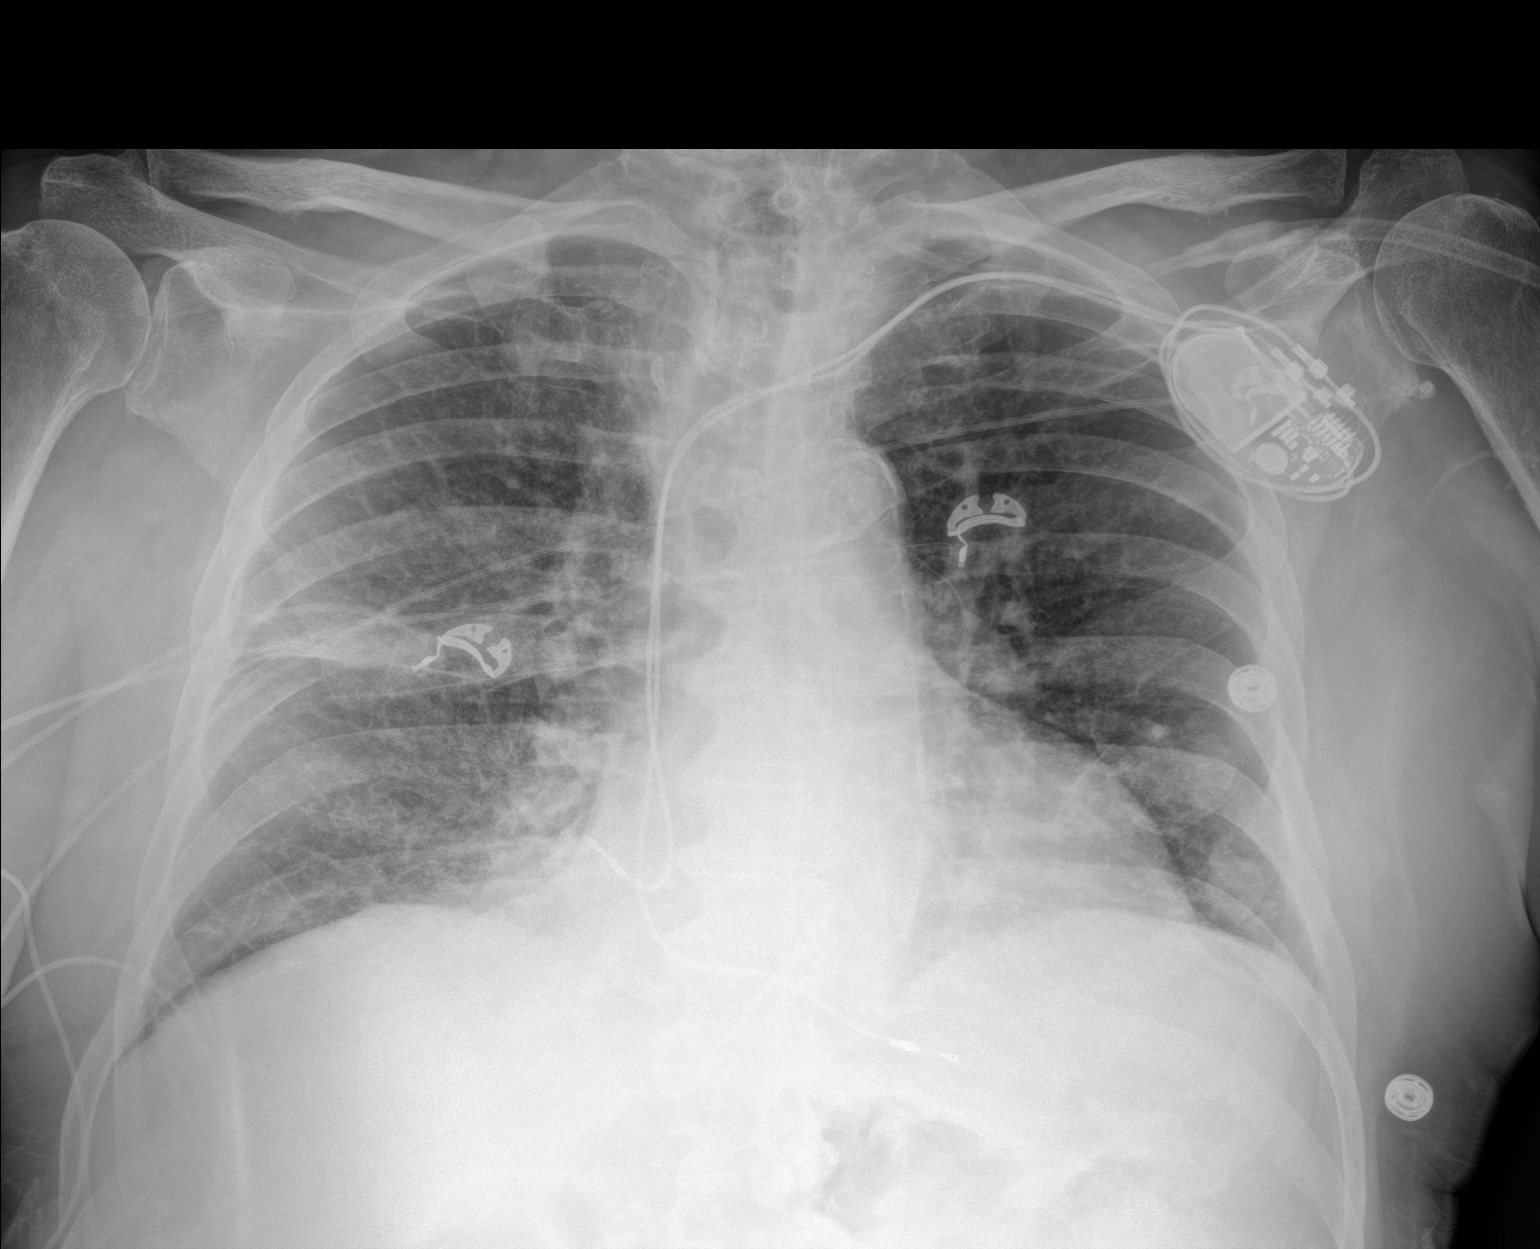

[1 of 1 positions shown; findings below may reference images not displayed]

FINDINGS: LEFT-sided dual lead pacer device, leads project over the cardiac
silhouette and power pack over the LEFT chest as before. EKG leads
project over the chest.

Cardiomediastinal contours are stable.

Increased interstitial and alveolar opacities seen more on the RIGHT
than the LEFT have diminished in the short interval. Bandlike area
of airspace disease in the RIGHT mid chest. No pneumothorax.

On limited assessment there is no acute skeletal process.
IMPRESSION: Increased interstitial and alveolar opacities seen on the RIGHT than
the LEFT have diminished in the short interval. Likely reflecting
improving edema and or volume overload.

Bandlike area of airspace disease in the RIGHT mid chest likely
atelectatic changes.

Persistent basilar opacities in the RIGHT medial lower chest related
to volume loss and post treatment changes, superimposed pneumonia
would be difficult to exclude.

## 2022-06-04 ENCOUNTER — Ambulatory Visit: Payer: Medicare Other | Admitting: Cardiovascular Disease

## 2022-07-19 ENCOUNTER — Ambulatory Visit (INDEPENDENT_AMBULATORY_CARE_PROVIDER_SITE_OTHER): Payer: Medicare Other | Admitting: Vascular Surgery

## 2022-07-19 ENCOUNTER — Encounter (INDEPENDENT_AMBULATORY_CARE_PROVIDER_SITE_OTHER): Payer: Medicare Other

## 2022-07-19 ENCOUNTER — Other Ambulatory Visit (INDEPENDENT_AMBULATORY_CARE_PROVIDER_SITE_OTHER): Payer: Medicare Other

## 2022-11-22 LAB — HISTOPLASMA ANTIGEN, URINE: Histoplasma Antigen, urine: <0.5 (ref <0.5)
